# Patient Record
Sex: Female | Born: 1980 | Race: Black or African American | Hispanic: No | Marital: Single | Smoking: Former smoker
Health system: Southern US, Community
[De-identification: ages and names within clinical notes are randomized; demographics above are authoritative.]

## PROBLEM LIST (undated history)

## (undated) ENCOUNTER — Emergency Department (HOSPITAL_COMMUNITY): Admission: EM | Payer: Medicare Other | Source: Home / Self Care

## (undated) DIAGNOSIS — F319 Bipolar disorder, unspecified: Secondary | ICD-10-CM

## (undated) DIAGNOSIS — E669 Obesity, unspecified: Secondary | ICD-10-CM

## (undated) DIAGNOSIS — R569 Unspecified convulsions: Secondary | ICD-10-CM

## (undated) DIAGNOSIS — E119 Type 2 diabetes mellitus without complications: Secondary | ICD-10-CM

## (undated) DIAGNOSIS — F419 Anxiety disorder, unspecified: Secondary | ICD-10-CM

## (undated) DIAGNOSIS — M1990 Unspecified osteoarthritis, unspecified site: Secondary | ICD-10-CM

## (undated) DIAGNOSIS — J984 Other disorders of lung: Secondary | ICD-10-CM

## (undated) DIAGNOSIS — R000 Tachycardia, unspecified: Secondary | ICD-10-CM

## (undated) DIAGNOSIS — G5601 Carpal tunnel syndrome, right upper limb: Secondary | ICD-10-CM

## (undated) DIAGNOSIS — I10 Essential (primary) hypertension: Secondary | ICD-10-CM

## (undated) DIAGNOSIS — I519 Heart disease, unspecified: Secondary | ICD-10-CM

## (undated) DIAGNOSIS — I509 Heart failure, unspecified: Secondary | ICD-10-CM

## (undated) DIAGNOSIS — Q246 Congenital heart block: Secondary | ICD-10-CM

## (undated) DIAGNOSIS — Z950 Presence of cardiac pacemaker: Secondary | ICD-10-CM

## (undated) DIAGNOSIS — J45909 Unspecified asthma, uncomplicated: Secondary | ICD-10-CM

## (undated) DIAGNOSIS — M329 Systemic lupus erythematosus, unspecified: Secondary | ICD-10-CM

## (undated) DIAGNOSIS — IMO0002 Reserved for concepts with insufficient information to code with codable children: Secondary | ICD-10-CM

## (undated) DIAGNOSIS — F329 Major depressive disorder, single episode, unspecified: Secondary | ICD-10-CM

## (undated) DIAGNOSIS — J189 Pneumonia, unspecified organism: Secondary | ICD-10-CM

## (undated) DIAGNOSIS — K219 Gastro-esophageal reflux disease without esophagitis: Secondary | ICD-10-CM

## (undated) DIAGNOSIS — J849 Interstitial pulmonary disease, unspecified: Secondary | ICD-10-CM

## (undated) DIAGNOSIS — I2781 Cor pulmonale (chronic): Secondary | ICD-10-CM

## (undated) DIAGNOSIS — F32A Depression, unspecified: Secondary | ICD-10-CM

## (undated) HISTORY — PX: INSERT / REPLACE / REMOVE PACEMAKER: SUR710

## (undated) HISTORY — PX: CHOLECYSTECTOMY: SHX55

## (undated) HISTORY — DX: Obesity, unspecified: E66.9

## (undated) HISTORY — DX: Congenital heart block: Q24.6

## (undated) HISTORY — PX: NASAL FRACTURE SURGERY: SHX718

## (undated) HISTORY — DX: Gastro-esophageal reflux disease without esophagitis: K21.9

## (undated) HISTORY — DX: Heart disease, unspecified: I51.9

## (undated) HISTORY — DX: Bipolar disorder, unspecified: F31.9

## (undated) HISTORY — DX: Presence of cardiac pacemaker: Z95.0

---
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
## 1999-08-18 ENCOUNTER — Emergency Department (HOSPITAL_COMMUNITY): Admission: EM | Admit: 1999-08-18 | Discharge: 1999-08-19 | Payer: Self-pay | Admitting: *Deleted

## 1999-10-26 ENCOUNTER — Emergency Department (HOSPITAL_COMMUNITY): Admission: EM | Admit: 1999-10-26 | Discharge: 1999-10-26 | Payer: Self-pay | Admitting: Emergency Medicine

## 1999-10-26 ENCOUNTER — Encounter: Payer: Self-pay | Admitting: Emergency Medicine

## 1999-11-13 ENCOUNTER — Emergency Department (HOSPITAL_COMMUNITY): Admission: EM | Admit: 1999-11-13 | Discharge: 1999-11-14 | Payer: Self-pay | Admitting: Emergency Medicine

## 1999-11-16 ENCOUNTER — Emergency Department (HOSPITAL_COMMUNITY): Admission: EM | Admit: 1999-11-16 | Discharge: 1999-11-16 | Payer: Self-pay | Admitting: Internal Medicine

## 1999-11-21 ENCOUNTER — Emergency Department (HOSPITAL_COMMUNITY): Admission: EM | Admit: 1999-11-21 | Discharge: 1999-11-21 | Payer: Self-pay | Admitting: Emergency Medicine

## 1999-11-22 ENCOUNTER — Emergency Department (HOSPITAL_COMMUNITY): Admission: EM | Admit: 1999-11-22 | Discharge: 1999-11-23 | Payer: Self-pay | Admitting: Internal Medicine

## 1999-11-23 ENCOUNTER — Encounter: Payer: Self-pay | Admitting: Internal Medicine

## 1999-11-28 ENCOUNTER — Inpatient Hospital Stay (HOSPITAL_COMMUNITY): Admission: EM | Admit: 1999-11-28 | Discharge: 1999-12-10 | Payer: Self-pay | Admitting: *Deleted

## 1999-11-28 ENCOUNTER — Emergency Department (HOSPITAL_COMMUNITY): Admission: EM | Admit: 1999-11-28 | Discharge: 1999-11-28 | Payer: Self-pay | Admitting: Emergency Medicine

## 1999-11-28 ENCOUNTER — Encounter: Payer: Self-pay | Admitting: Emergency Medicine

## 2000-01-15 ENCOUNTER — Other Ambulatory Visit: Admission: RE | Admit: 2000-01-15 | Discharge: 2000-01-15 | Payer: Self-pay | Admitting: Family Medicine

## 2000-03-21 ENCOUNTER — Emergency Department (HOSPITAL_COMMUNITY): Admission: EM | Admit: 2000-03-21 | Discharge: 2000-03-22 | Payer: Self-pay | Admitting: Emergency Medicine

## 2000-03-21 ENCOUNTER — Encounter: Payer: Self-pay | Admitting: Emergency Medicine

## 2000-09-28 ENCOUNTER — Encounter: Payer: Self-pay | Admitting: Emergency Medicine

## 2000-09-28 ENCOUNTER — Emergency Department (HOSPITAL_COMMUNITY): Admission: EM | Admit: 2000-09-28 | Discharge: 2000-09-28 | Payer: Self-pay | Admitting: Emergency Medicine

## 2000-12-01 ENCOUNTER — Emergency Department (HOSPITAL_COMMUNITY): Admission: EM | Admit: 2000-12-01 | Discharge: 2000-12-02 | Payer: Self-pay | Admitting: Emergency Medicine

## 2000-12-02 ENCOUNTER — Encounter: Payer: Self-pay | Admitting: Emergency Medicine

## 2001-01-14 ENCOUNTER — Encounter: Admission: RE | Admit: 2001-01-14 | Discharge: 2001-01-14 | Payer: Self-pay | Admitting: Family Medicine

## 2001-01-14 ENCOUNTER — Other Ambulatory Visit: Admission: RE | Admit: 2001-01-14 | Discharge: 2001-01-14 | Payer: Self-pay | Admitting: *Deleted

## 2001-02-12 ENCOUNTER — Inpatient Hospital Stay (HOSPITAL_COMMUNITY): Admission: EM | Admit: 2001-02-12 | Discharge: 2001-02-16 | Payer: Self-pay | Admitting: *Deleted

## 2001-08-02 ENCOUNTER — Encounter: Admission: RE | Admit: 2001-08-02 | Discharge: 2001-08-02 | Payer: Self-pay | Admitting: Family Medicine

## 2001-08-26 ENCOUNTER — Emergency Department (HOSPITAL_COMMUNITY): Admission: EM | Admit: 2001-08-26 | Discharge: 2001-08-26 | Payer: Self-pay

## 2001-10-17 ENCOUNTER — Emergency Department (HOSPITAL_COMMUNITY): Admission: EM | Admit: 2001-10-17 | Discharge: 2001-10-18 | Payer: Self-pay | Admitting: Emergency Medicine

## 2001-10-18 ENCOUNTER — Encounter: Payer: Self-pay | Admitting: Emergency Medicine

## 2001-11-02 ENCOUNTER — Emergency Department (HOSPITAL_COMMUNITY): Admission: EM | Admit: 2001-11-02 | Discharge: 2001-11-02 | Payer: Self-pay

## 2002-02-16 ENCOUNTER — Emergency Department (HOSPITAL_COMMUNITY): Admission: EM | Admit: 2002-02-16 | Discharge: 2002-02-16 | Payer: Self-pay | Admitting: Emergency Medicine

## 2003-01-03 ENCOUNTER — Encounter: Admission: RE | Admit: 2003-01-03 | Discharge: 2003-01-03 | Payer: Self-pay | Admitting: Family Medicine

## 2003-01-30 ENCOUNTER — Emergency Department (HOSPITAL_COMMUNITY): Admission: EM | Admit: 2003-01-30 | Discharge: 2003-01-30 | Payer: Self-pay | Admitting: Emergency Medicine

## 2003-02-07 ENCOUNTER — Encounter: Admission: RE | Admit: 2003-02-07 | Discharge: 2003-02-07 | Payer: Self-pay | Admitting: Family Medicine

## 2003-02-10 ENCOUNTER — Encounter: Admission: RE | Admit: 2003-02-10 | Discharge: 2003-02-10 | Payer: Self-pay | Admitting: Family Medicine

## 2003-03-28 ENCOUNTER — Encounter: Admission: RE | Admit: 2003-03-28 | Discharge: 2003-03-28 | Payer: Self-pay | Admitting: Family Medicine

## 2003-04-02 ENCOUNTER — Emergency Department (HOSPITAL_COMMUNITY): Admission: EM | Admit: 2003-04-02 | Discharge: 2003-04-03 | Payer: Self-pay | Admitting: Emergency Medicine

## 2003-05-05 ENCOUNTER — Encounter: Payer: Self-pay | Admitting: Emergency Medicine

## 2003-05-05 ENCOUNTER — Emergency Department (HOSPITAL_COMMUNITY): Admission: EM | Admit: 2003-05-05 | Discharge: 2003-05-05 | Payer: Self-pay | Admitting: *Deleted

## 2003-05-26 ENCOUNTER — Encounter: Admission: RE | Admit: 2003-05-26 | Discharge: 2003-05-26 | Payer: Self-pay | Admitting: Family Medicine

## 2003-06-21 ENCOUNTER — Encounter: Payer: Self-pay | Admitting: Cardiology

## 2003-06-21 ENCOUNTER — Ambulatory Visit (HOSPITAL_COMMUNITY): Admission: RE | Admit: 2003-06-21 | Discharge: 2003-06-21 | Payer: Self-pay | Admitting: Cardiology

## 2003-08-24 ENCOUNTER — Emergency Department (HOSPITAL_COMMUNITY): Admission: EM | Admit: 2003-08-24 | Discharge: 2003-08-24 | Payer: Self-pay | Admitting: Emergency Medicine

## 2003-08-24 ENCOUNTER — Encounter: Admission: RE | Admit: 2003-08-24 | Discharge: 2003-08-24 | Payer: Self-pay | Admitting: Sports Medicine

## 2003-09-05 ENCOUNTER — Emergency Department (HOSPITAL_COMMUNITY): Admission: EM | Admit: 2003-09-05 | Discharge: 2003-09-05 | Payer: Self-pay | Admitting: Emergency Medicine

## 2003-09-08 ENCOUNTER — Encounter: Admission: RE | Admit: 2003-09-08 | Discharge: 2003-09-08 | Payer: Self-pay | Admitting: Sports Medicine

## 2003-11-02 ENCOUNTER — Encounter: Admission: RE | Admit: 2003-11-02 | Discharge: 2003-11-02 | Payer: Self-pay | Admitting: Sports Medicine

## 2003-11-13 ENCOUNTER — Emergency Department (HOSPITAL_COMMUNITY): Admission: AD | Admit: 2003-11-13 | Discharge: 2003-11-13 | Payer: Self-pay | Admitting: Family Medicine

## 2003-11-21 ENCOUNTER — Encounter: Admission: RE | Admit: 2003-11-21 | Discharge: 2003-11-21 | Payer: Self-pay | Admitting: Family Medicine

## 2003-12-28 ENCOUNTER — Encounter: Admission: RE | Admit: 2003-12-28 | Discharge: 2003-12-28 | Payer: Self-pay | Admitting: Family Medicine

## 2003-12-29 ENCOUNTER — Encounter: Admission: RE | Admit: 2003-12-29 | Discharge: 2003-12-29 | Payer: Self-pay | Admitting: Family Medicine

## 2004-02-08 ENCOUNTER — Encounter: Admission: RE | Admit: 2004-02-08 | Discharge: 2004-02-08 | Payer: Self-pay | Admitting: Family Medicine

## 2004-02-15 ENCOUNTER — Encounter: Admission: RE | Admit: 2004-02-15 | Discharge: 2004-02-15 | Payer: Self-pay | Admitting: Sports Medicine

## 2004-02-21 ENCOUNTER — Ambulatory Visit (HOSPITAL_COMMUNITY): Admission: RE | Admit: 2004-02-21 | Discharge: 2004-02-21 | Payer: Self-pay | Admitting: Family Medicine

## 2004-03-01 ENCOUNTER — Encounter: Admission: RE | Admit: 2004-03-01 | Discharge: 2004-03-01 | Payer: Self-pay | Admitting: Family Medicine

## 2004-03-22 ENCOUNTER — Encounter (INDEPENDENT_AMBULATORY_CARE_PROVIDER_SITE_OTHER): Payer: Self-pay | Admitting: Specialist

## 2004-03-22 ENCOUNTER — Observation Stay (HOSPITAL_COMMUNITY): Admission: RE | Admit: 2004-03-22 | Discharge: 2004-03-23 | Payer: Self-pay

## 2004-04-23 ENCOUNTER — Encounter: Admission: RE | Admit: 2004-04-23 | Discharge: 2004-04-23 | Payer: Self-pay | Admitting: Sports Medicine

## 2004-05-08 ENCOUNTER — Encounter: Admission: RE | Admit: 2004-05-08 | Discharge: 2004-05-08 | Payer: Self-pay | Admitting: Family Medicine

## 2004-05-24 ENCOUNTER — Encounter: Admission: RE | Admit: 2004-05-24 | Discharge: 2004-05-24 | Payer: Self-pay | Admitting: Family Medicine

## 2004-05-31 ENCOUNTER — Emergency Department (HOSPITAL_COMMUNITY): Admission: EM | Admit: 2004-05-31 | Discharge: 2004-05-31 | Payer: Self-pay | Admitting: Emergency Medicine

## 2004-06-05 ENCOUNTER — Emergency Department (HOSPITAL_COMMUNITY): Admission: EM | Admit: 2004-06-05 | Discharge: 2004-06-05 | Payer: Self-pay | Admitting: Family Medicine

## 2004-07-09 ENCOUNTER — Emergency Department (HOSPITAL_COMMUNITY): Admission: EM | Admit: 2004-07-09 | Discharge: 2004-07-09 | Payer: Self-pay | Admitting: *Deleted

## 2004-07-15 ENCOUNTER — Ambulatory Visit: Payer: Self-pay | Admitting: Family Medicine

## 2004-08-01 ENCOUNTER — Ambulatory Visit: Payer: Self-pay | Admitting: Family Medicine

## 2004-08-28 ENCOUNTER — Emergency Department (HOSPITAL_COMMUNITY): Admission: EM | Admit: 2004-08-28 | Discharge: 2004-08-28 | Payer: Self-pay | Admitting: Emergency Medicine

## 2004-08-30 ENCOUNTER — Ambulatory Visit: Payer: Self-pay | Admitting: Family Medicine

## 2004-11-06 ENCOUNTER — Ambulatory Visit: Payer: Self-pay | Admitting: Family Medicine

## 2004-11-16 ENCOUNTER — Emergency Department (HOSPITAL_COMMUNITY): Admission: EM | Admit: 2004-11-16 | Discharge: 2004-11-17 | Payer: Self-pay | Admitting: Emergency Medicine

## 2005-01-08 ENCOUNTER — Emergency Department (HOSPITAL_COMMUNITY): Admission: EM | Admit: 2005-01-08 | Discharge: 2005-01-08 | Payer: Self-pay | Admitting: Emergency Medicine

## 2005-01-10 ENCOUNTER — Ambulatory Visit: Payer: Self-pay | Admitting: Family Medicine

## 2005-03-04 ENCOUNTER — Ambulatory Visit: Payer: Self-pay | Admitting: Family Medicine

## 2005-05-23 ENCOUNTER — Ambulatory Visit: Payer: Self-pay | Admitting: Family Medicine

## 2005-05-30 ENCOUNTER — Ambulatory Visit: Payer: Self-pay | Admitting: Family Medicine

## 2005-07-22 ENCOUNTER — Ambulatory Visit: Payer: Self-pay | Admitting: Sports Medicine

## 2005-07-22 ENCOUNTER — Ambulatory Visit (HOSPITAL_COMMUNITY): Admission: RE | Admit: 2005-07-22 | Discharge: 2005-07-22 | Payer: Self-pay | Admitting: Sports Medicine

## 2005-07-23 ENCOUNTER — Emergency Department (HOSPITAL_COMMUNITY): Admission: EM | Admit: 2005-07-23 | Discharge: 2005-07-23 | Payer: Self-pay | Admitting: Family Medicine

## 2005-07-23 ENCOUNTER — Ambulatory Visit: Payer: Self-pay | Admitting: *Deleted

## 2005-07-23 ENCOUNTER — Ambulatory Visit (HOSPITAL_COMMUNITY): Admission: RE | Admit: 2005-07-23 | Discharge: 2005-07-23 | Payer: Self-pay | Admitting: Family Medicine

## 2005-07-29 ENCOUNTER — Ambulatory Visit: Payer: Self-pay | Admitting: Sports Medicine

## 2005-08-29 ENCOUNTER — Ambulatory Visit: Payer: Self-pay | Admitting: Sports Medicine

## 2005-08-29 ENCOUNTER — Other Ambulatory Visit: Admission: RE | Admit: 2005-08-29 | Discharge: 2005-08-29 | Payer: Self-pay | Admitting: Family Medicine

## 2005-09-24 ENCOUNTER — Encounter: Admission: RE | Admit: 2005-09-24 | Discharge: 2005-09-24 | Payer: Self-pay | Admitting: Otolaryngology

## 2005-10-15 ENCOUNTER — Ambulatory Visit (HOSPITAL_COMMUNITY): Admission: RE | Admit: 2005-10-15 | Discharge: 2005-10-15 | Payer: Self-pay | Admitting: Otolaryngology

## 2005-10-15 ENCOUNTER — Encounter (INDEPENDENT_AMBULATORY_CARE_PROVIDER_SITE_OTHER): Payer: Self-pay | Admitting: *Deleted

## 2005-11-28 ENCOUNTER — Ambulatory Visit: Payer: Self-pay | Admitting: Family Medicine

## 2005-12-19 ENCOUNTER — Encounter: Admission: RE | Admit: 2005-12-19 | Discharge: 2005-12-19 | Payer: Self-pay

## 2005-12-25 ENCOUNTER — Ambulatory Visit: Payer: Self-pay | Admitting: Family Medicine

## 2006-01-21 ENCOUNTER — Ambulatory Visit: Payer: Self-pay | Admitting: Family Medicine

## 2006-02-10 ENCOUNTER — Ambulatory Visit: Payer: Self-pay | Admitting: Family Medicine

## 2006-02-19 ENCOUNTER — Ambulatory Visit: Payer: Self-pay | Admitting: Family Medicine

## 2006-03-30 ENCOUNTER — Ambulatory Visit: Payer: Self-pay | Admitting: Sports Medicine

## 2006-04-02 ENCOUNTER — Ambulatory Visit (HOSPITAL_COMMUNITY): Admission: RE | Admit: 2006-04-02 | Discharge: 2006-04-02 | Payer: Self-pay | Admitting: Family Medicine

## 2006-04-10 ENCOUNTER — Ambulatory Visit: Payer: Self-pay | Admitting: Family Medicine

## 2006-04-30 ENCOUNTER — Ambulatory Visit: Payer: Self-pay | Admitting: Family Medicine

## 2006-07-08 ENCOUNTER — Ambulatory Visit: Payer: Self-pay | Admitting: Sports Medicine

## 2006-07-14 ENCOUNTER — Ambulatory Visit: Payer: Self-pay | Admitting: Family Medicine

## 2006-07-23 ENCOUNTER — Ambulatory Visit: Payer: Self-pay | Admitting: Family Medicine

## 2006-08-02 IMAGING — CR DG ANKLE COMPLETE 3+V*R*
3 series · 3 of 3 positions shown · non-contrast
Comparison: none

CLINICAL DATA: ankle pain and swelling
COMPLETE RIGHT ANKLE 07/09/04

[view not recorded (1 of 3)]
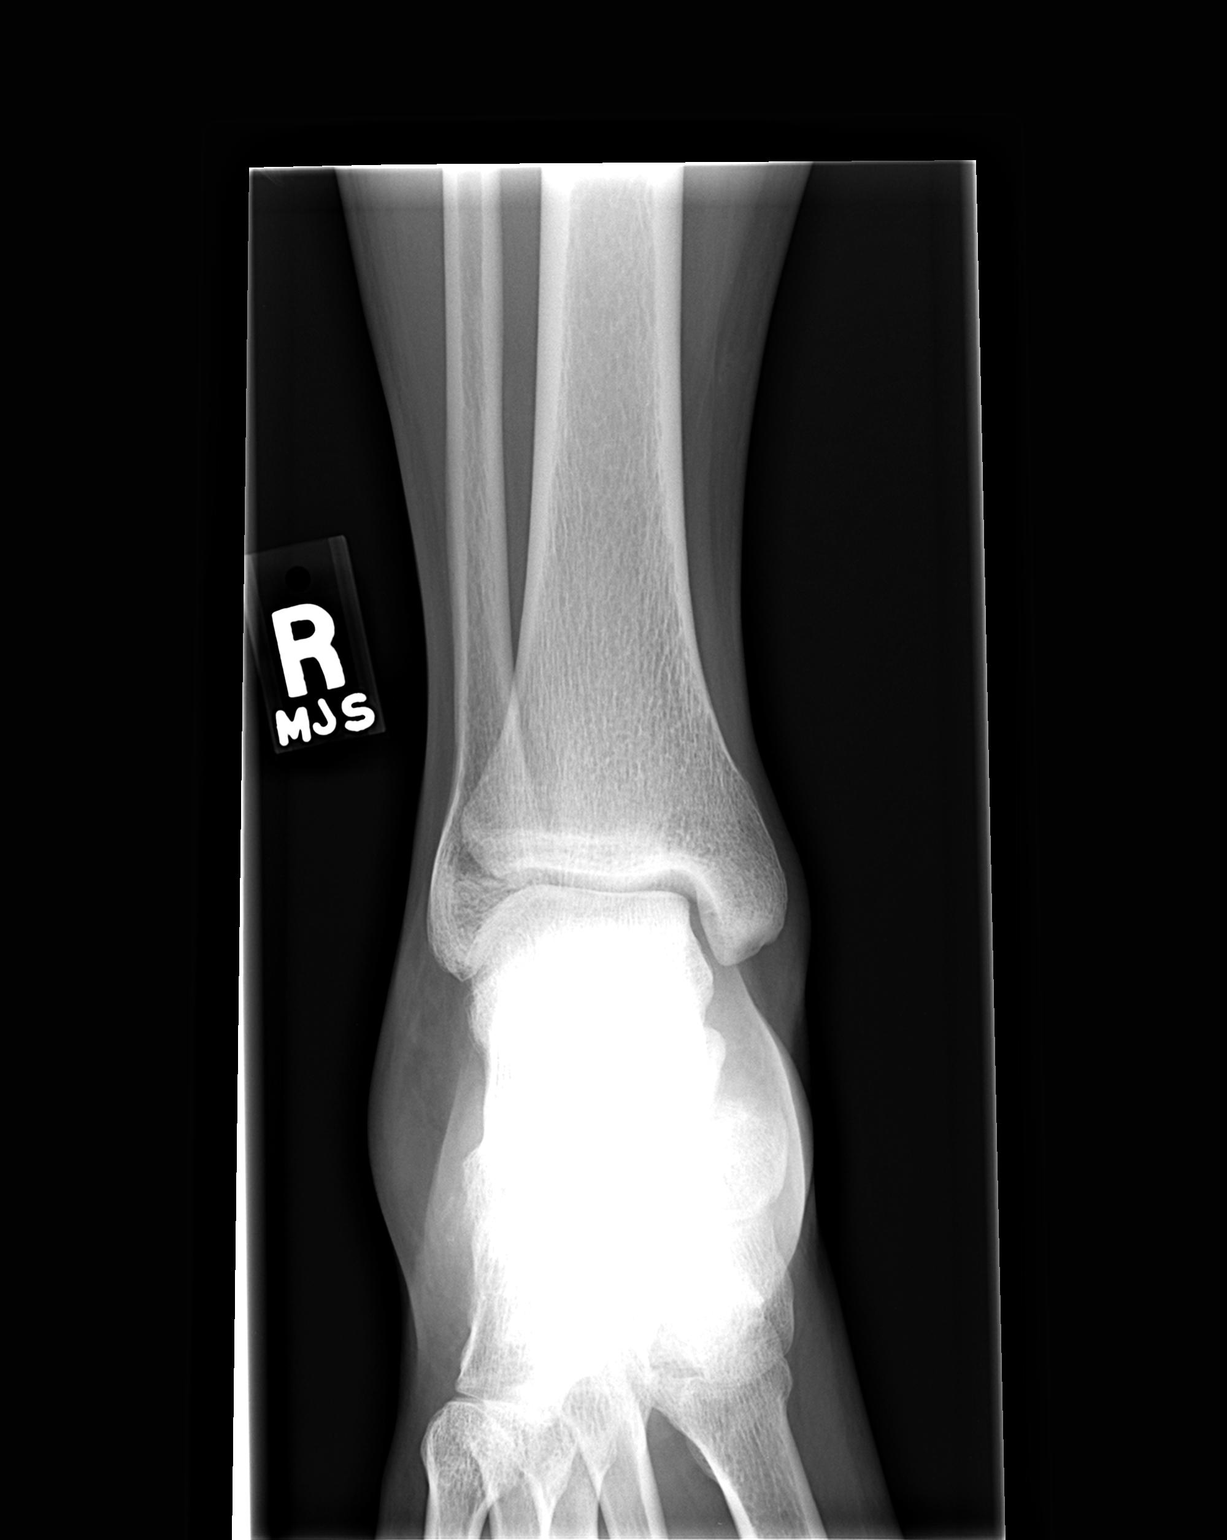

[view not recorded (2 of 3)]
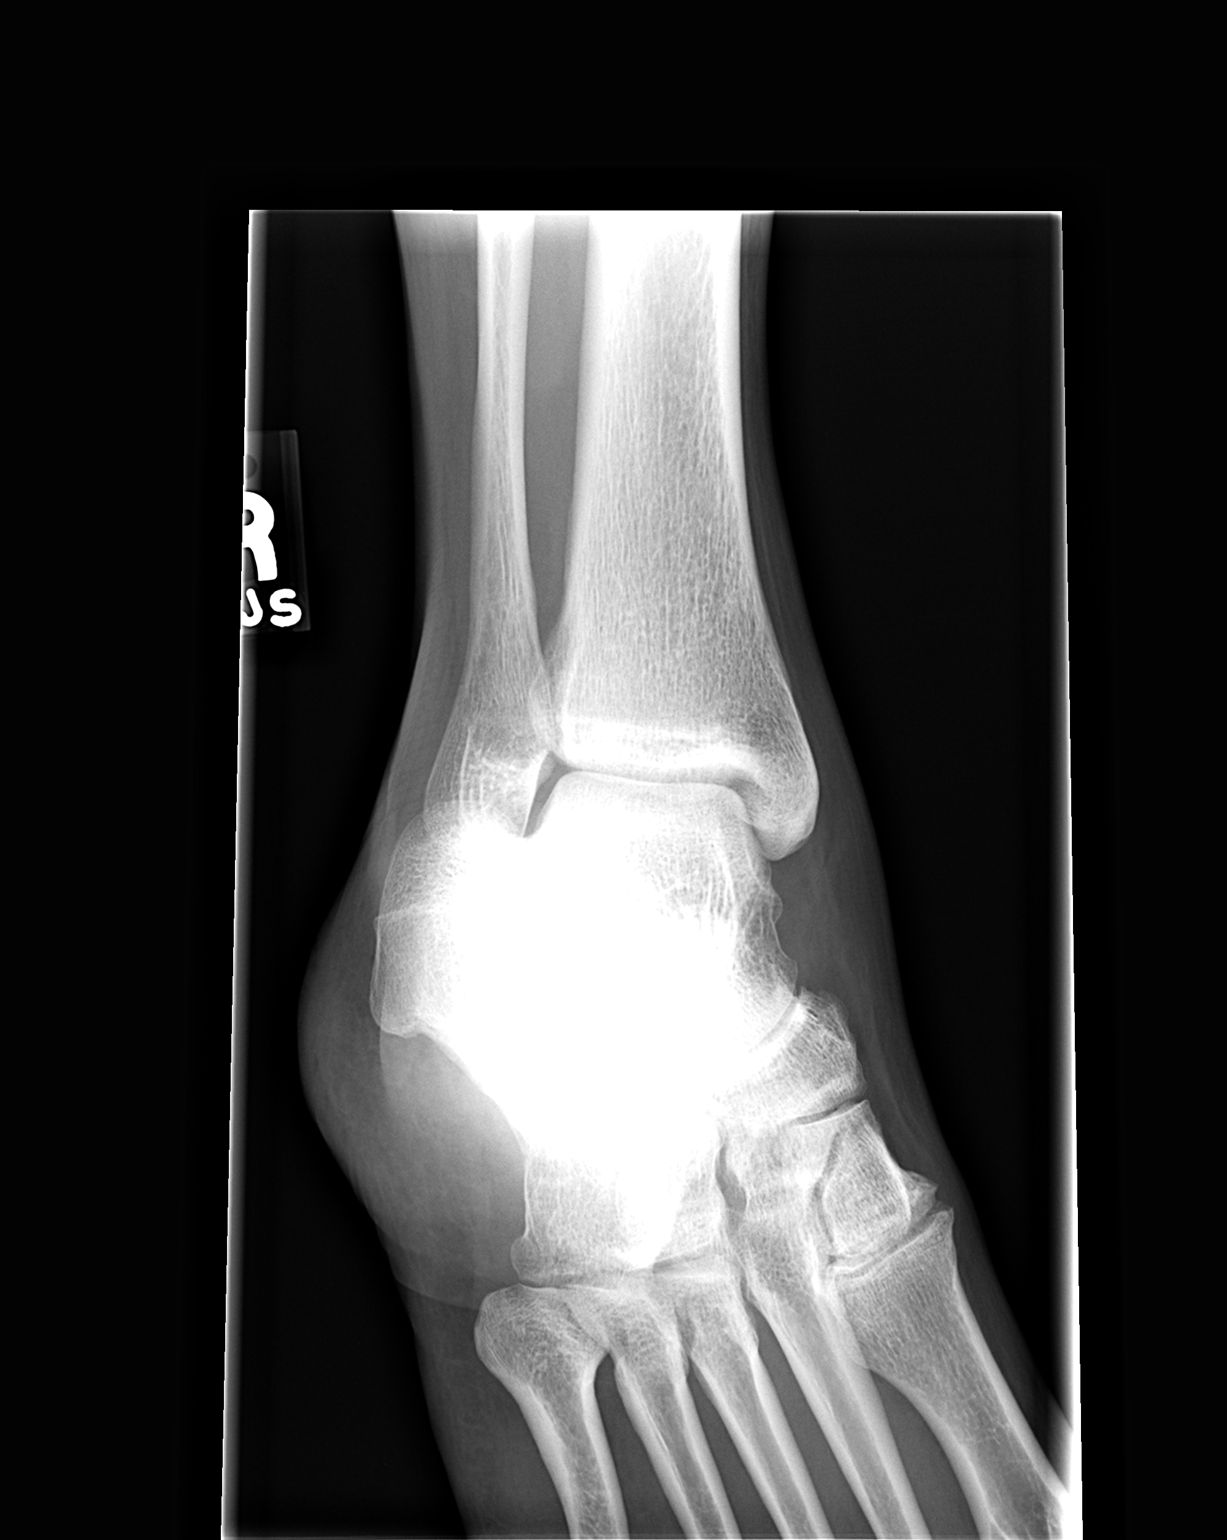

[view not recorded (3 of 3)]
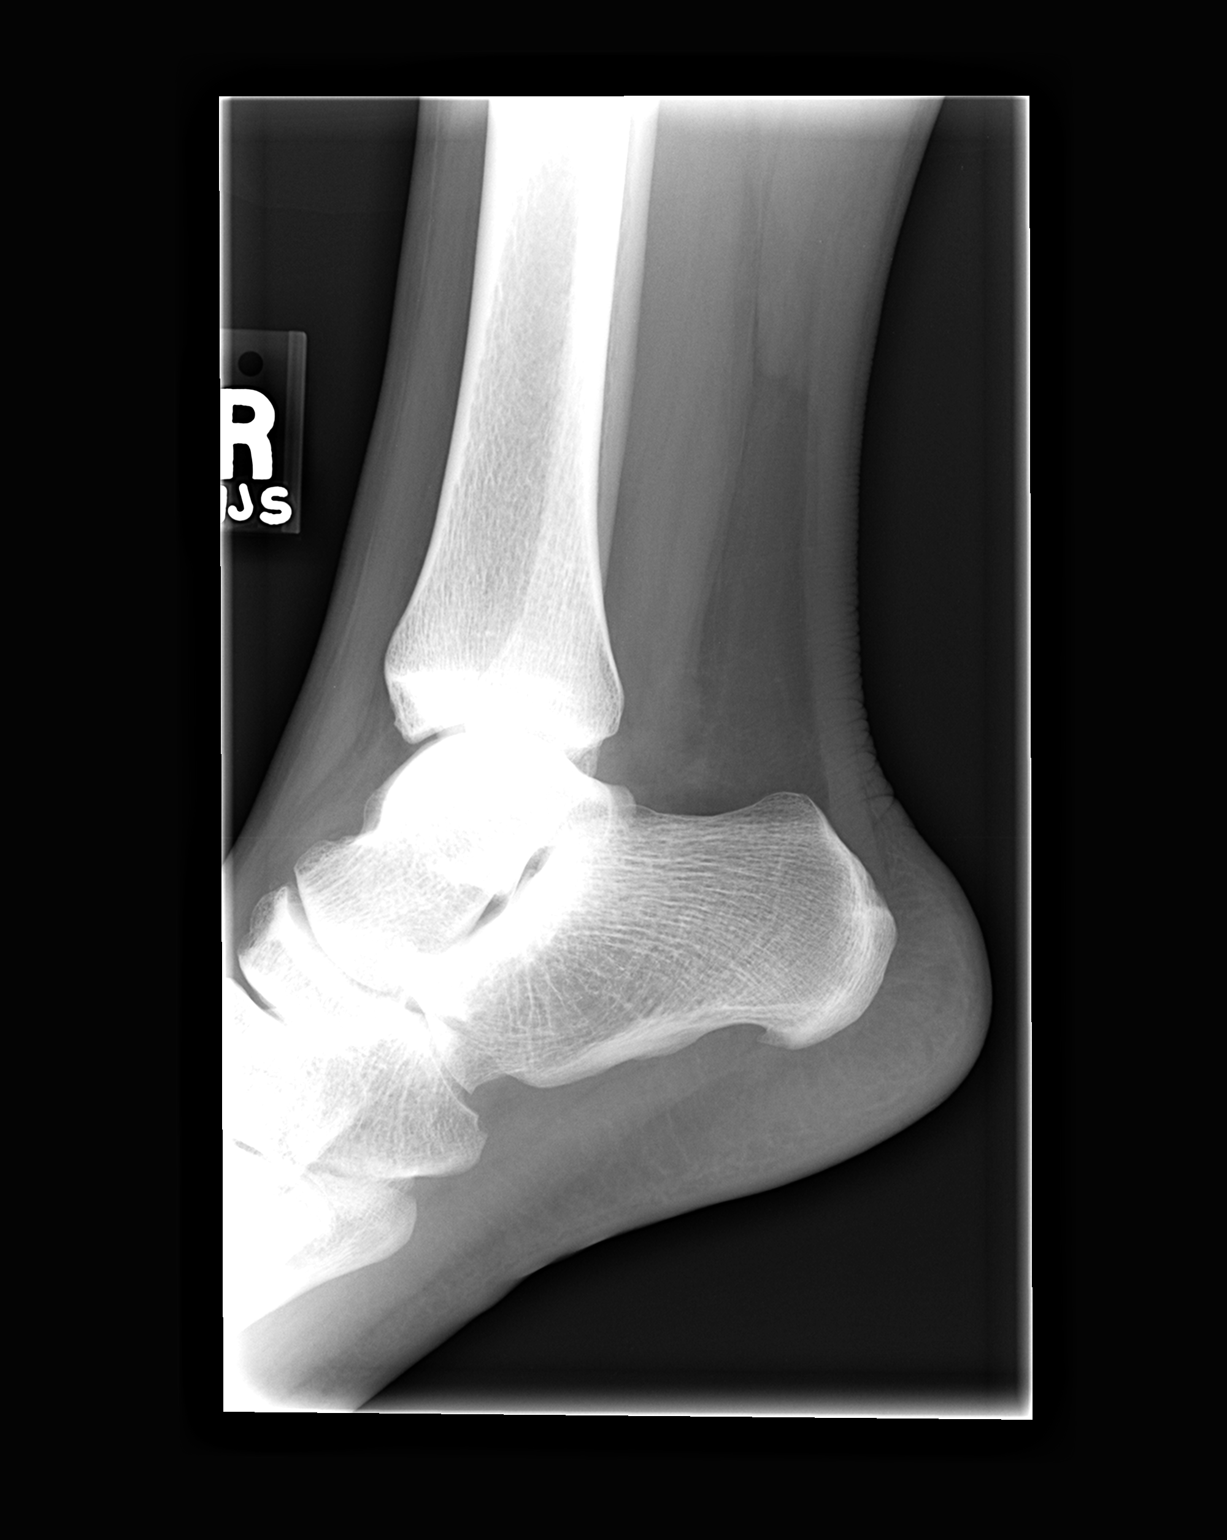

[3 of 3 positions shown; findings below may reference images not displayed]

FINDINGS: Soft tissue swelling overlying the lateral ankle/foot noted.  There is suggestion of small bony densities along the distal lateral calcaneus/cuboid and question fracture.   No other significant abnormality is identified.
IMPRESSION
Question fracture off the lateral distal calcaneus/cuboid.  Consider further evaluation with dedicated foot films.

## 2006-08-02 IMAGING — CR DG FOOT COMPLETE 3+V*R*
3 series · 3 of 3 positions shown · non-contrast
Comparison: none

CLINICAL DATA: Ankle pain and swelling laterally.  
COMPLETE RIGHT FOOT, 07/09/04

[view not recorded (1 of 3)]
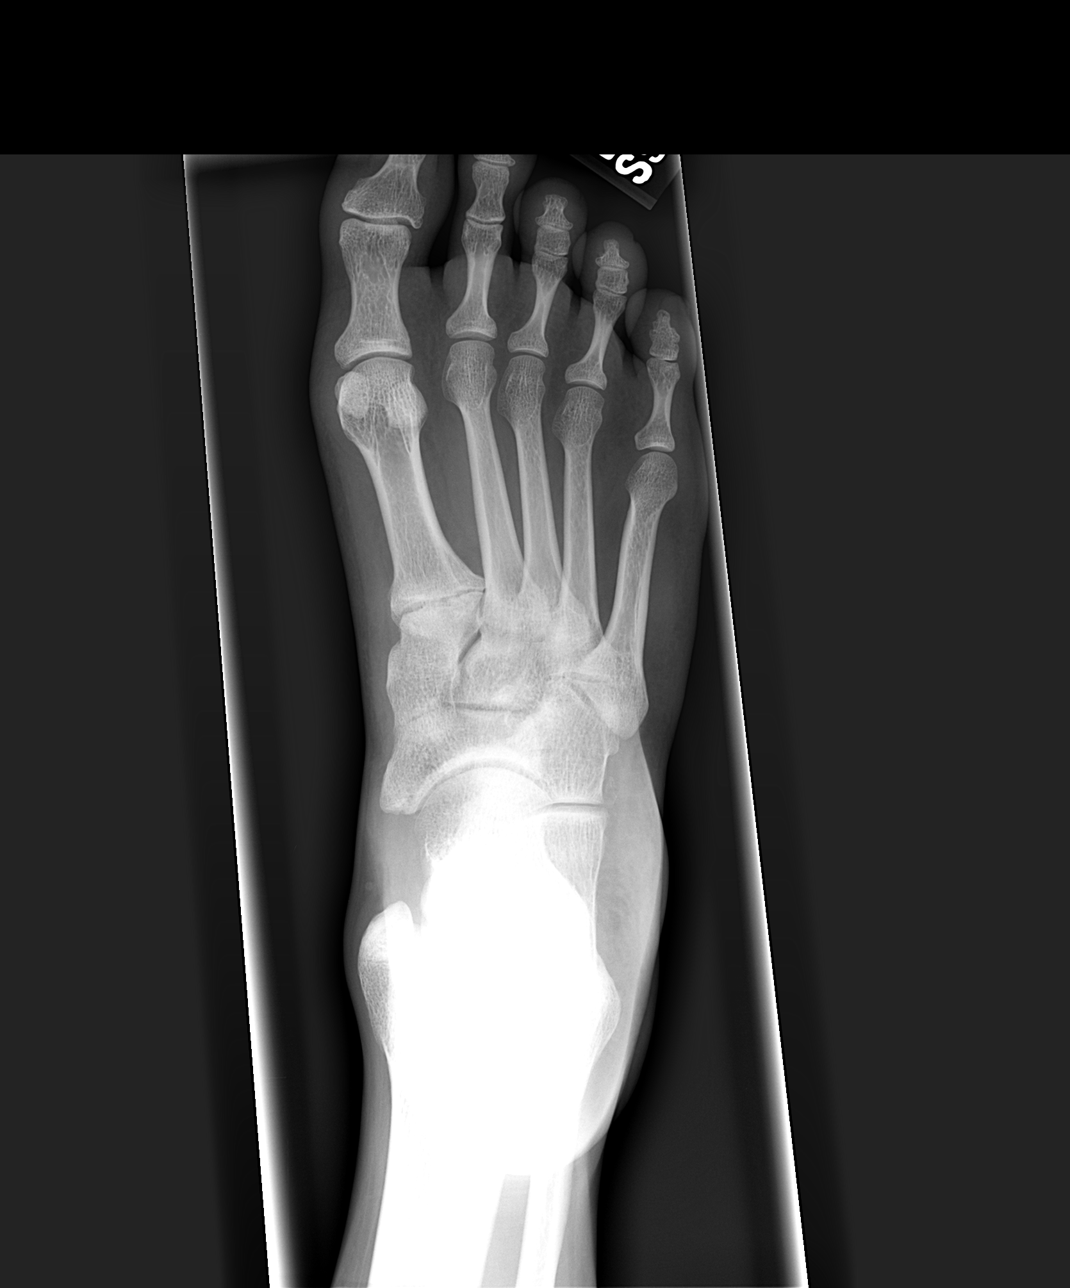

[view not recorded (2 of 3)]
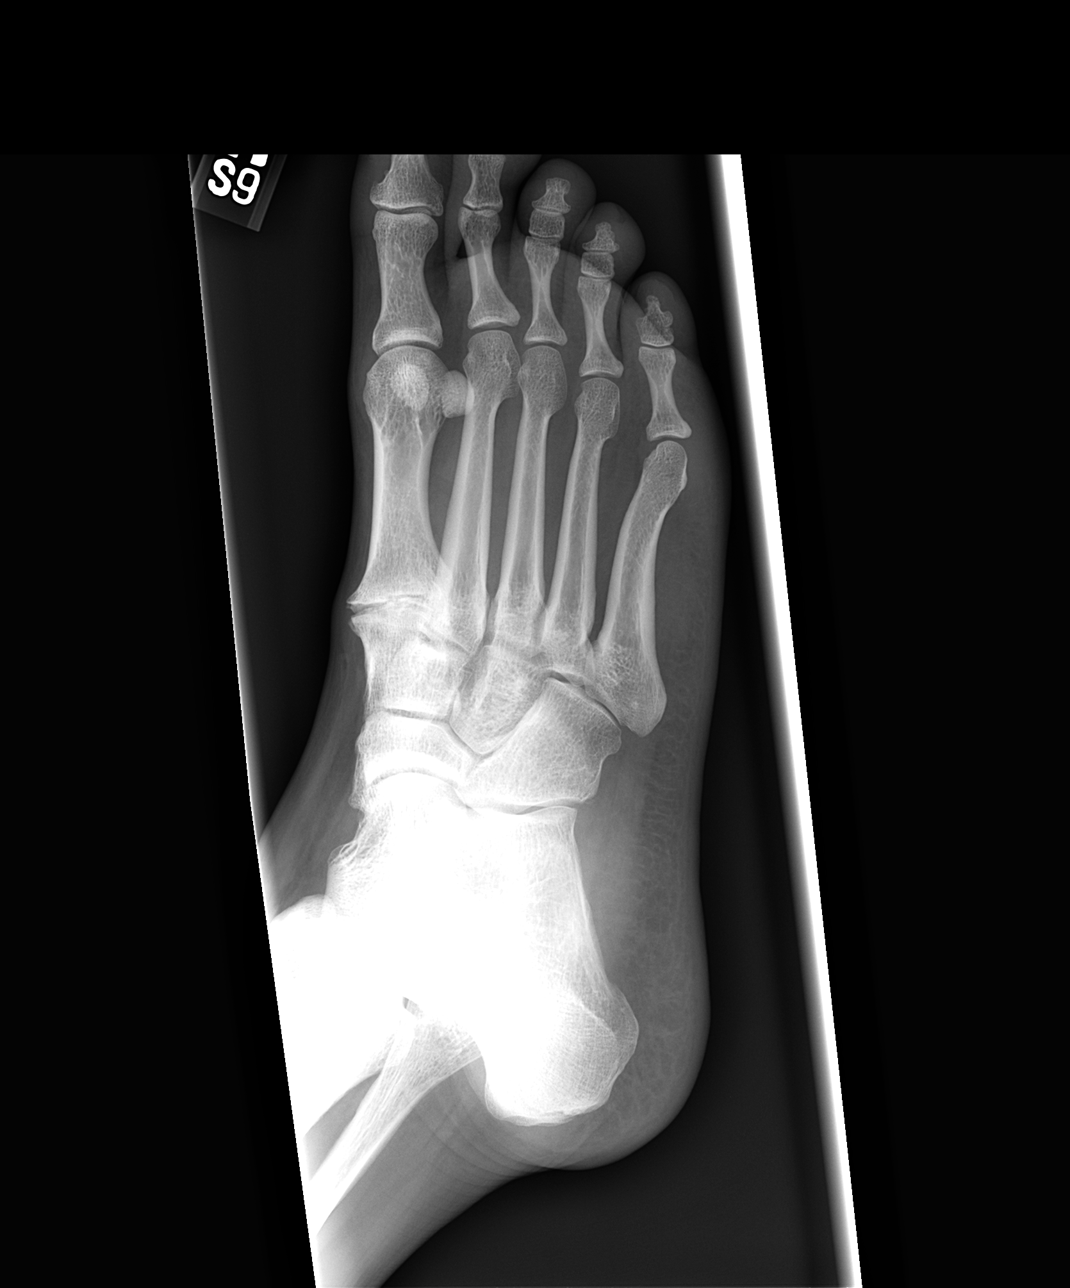

[view not recorded (3 of 3)]
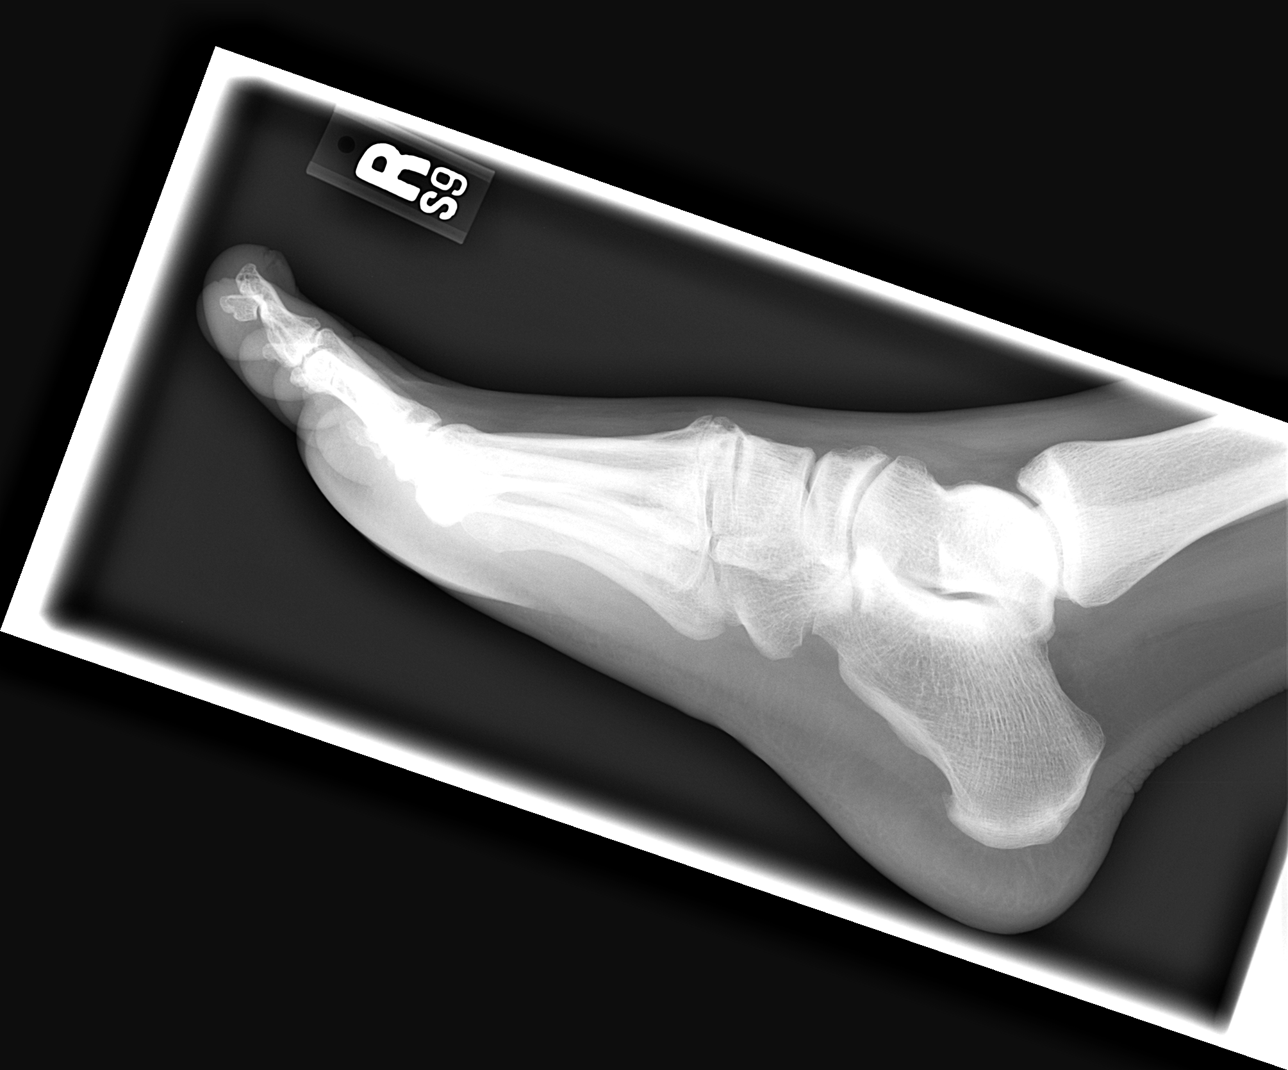

[3 of 3 positions shown; findings below may reference images not displayed]

FINDINGS: Soft tissue swelling overlying the lateral right foot noted.  Small bony densities along the cuboid bone may represent developing accessory ossicle but given soft tissue swelling and pain in area, a fracture is suspected.  Remainder of the right foot is unremarkable.
IMPRESSION
Small bony density along the lateral cuboid bone with associated soft tissue swelling, suspicious for fracture.

## 2006-09-18 ENCOUNTER — Ambulatory Visit: Payer: Self-pay | Admitting: Family Medicine

## 2006-09-23 ENCOUNTER — Ambulatory Visit: Payer: Self-pay | Admitting: Family Medicine

## 2006-10-30 ENCOUNTER — Ambulatory Visit: Payer: Self-pay | Admitting: Family Medicine

## 2006-11-09 ENCOUNTER — Ambulatory Visit: Payer: Self-pay | Admitting: Sports Medicine

## 2006-11-20 ENCOUNTER — Encounter (INDEPENDENT_AMBULATORY_CARE_PROVIDER_SITE_OTHER): Payer: Self-pay | Admitting: *Deleted

## 2006-11-20 LAB — CONVERTED CEMR LAB

## 2006-11-25 ENCOUNTER — Emergency Department (HOSPITAL_COMMUNITY): Admission: EM | Admit: 2006-11-25 | Discharge: 2006-11-26 | Payer: Self-pay | Admitting: Emergency Medicine

## 2006-11-27 ENCOUNTER — Encounter (INDEPENDENT_AMBULATORY_CARE_PROVIDER_SITE_OTHER): Payer: Self-pay | Admitting: Family Medicine

## 2006-11-27 ENCOUNTER — Ambulatory Visit: Payer: Self-pay | Admitting: Family Medicine

## 2006-11-27 LAB — CONVERTED CEMR LAB: GC Probe Amp, Genital: NEGATIVE

## 2006-12-11 ENCOUNTER — Emergency Department (HOSPITAL_COMMUNITY): Admission: EM | Admit: 2006-12-11 | Discharge: 2006-12-11 | Payer: Self-pay | Admitting: Family Medicine

## 2006-12-17 DIAGNOSIS — K589 Irritable bowel syndrome without diarrhea: Secondary | ICD-10-CM

## 2006-12-17 DIAGNOSIS — E282 Polycystic ovarian syndrome: Secondary | ICD-10-CM | POA: Insufficient documentation

## 2006-12-17 DIAGNOSIS — I459 Conduction disorder, unspecified: Secondary | ICD-10-CM | POA: Insufficient documentation

## 2006-12-17 DIAGNOSIS — F319 Bipolar disorder, unspecified: Secondary | ICD-10-CM | POA: Insufficient documentation

## 2006-12-17 DIAGNOSIS — E669 Obesity, unspecified: Secondary | ICD-10-CM | POA: Insufficient documentation

## 2006-12-18 ENCOUNTER — Encounter (INDEPENDENT_AMBULATORY_CARE_PROVIDER_SITE_OTHER): Payer: Self-pay | Admitting: *Deleted

## 2007-01-08 ENCOUNTER — Telehealth (INDEPENDENT_AMBULATORY_CARE_PROVIDER_SITE_OTHER): Payer: Self-pay | Admitting: Family Medicine

## 2007-01-27 ENCOUNTER — Ambulatory Visit: Payer: Self-pay | Admitting: Family Medicine

## 2007-01-28 ENCOUNTER — Encounter (INDEPENDENT_AMBULATORY_CARE_PROVIDER_SITE_OTHER): Payer: Self-pay | Admitting: Family Medicine

## 2007-02-01 ENCOUNTER — Telehealth (INDEPENDENT_AMBULATORY_CARE_PROVIDER_SITE_OTHER): Payer: Self-pay | Admitting: *Deleted

## 2007-02-01 IMAGING — CR DG CHEST 1V PORT
1 series · 1 of 1 positions shown · non-contrast
Comparison: none

CLINICAL DATA: weakness, shortness of breath 
 PORTABLE [id number] VIEW:

[view not recorded]
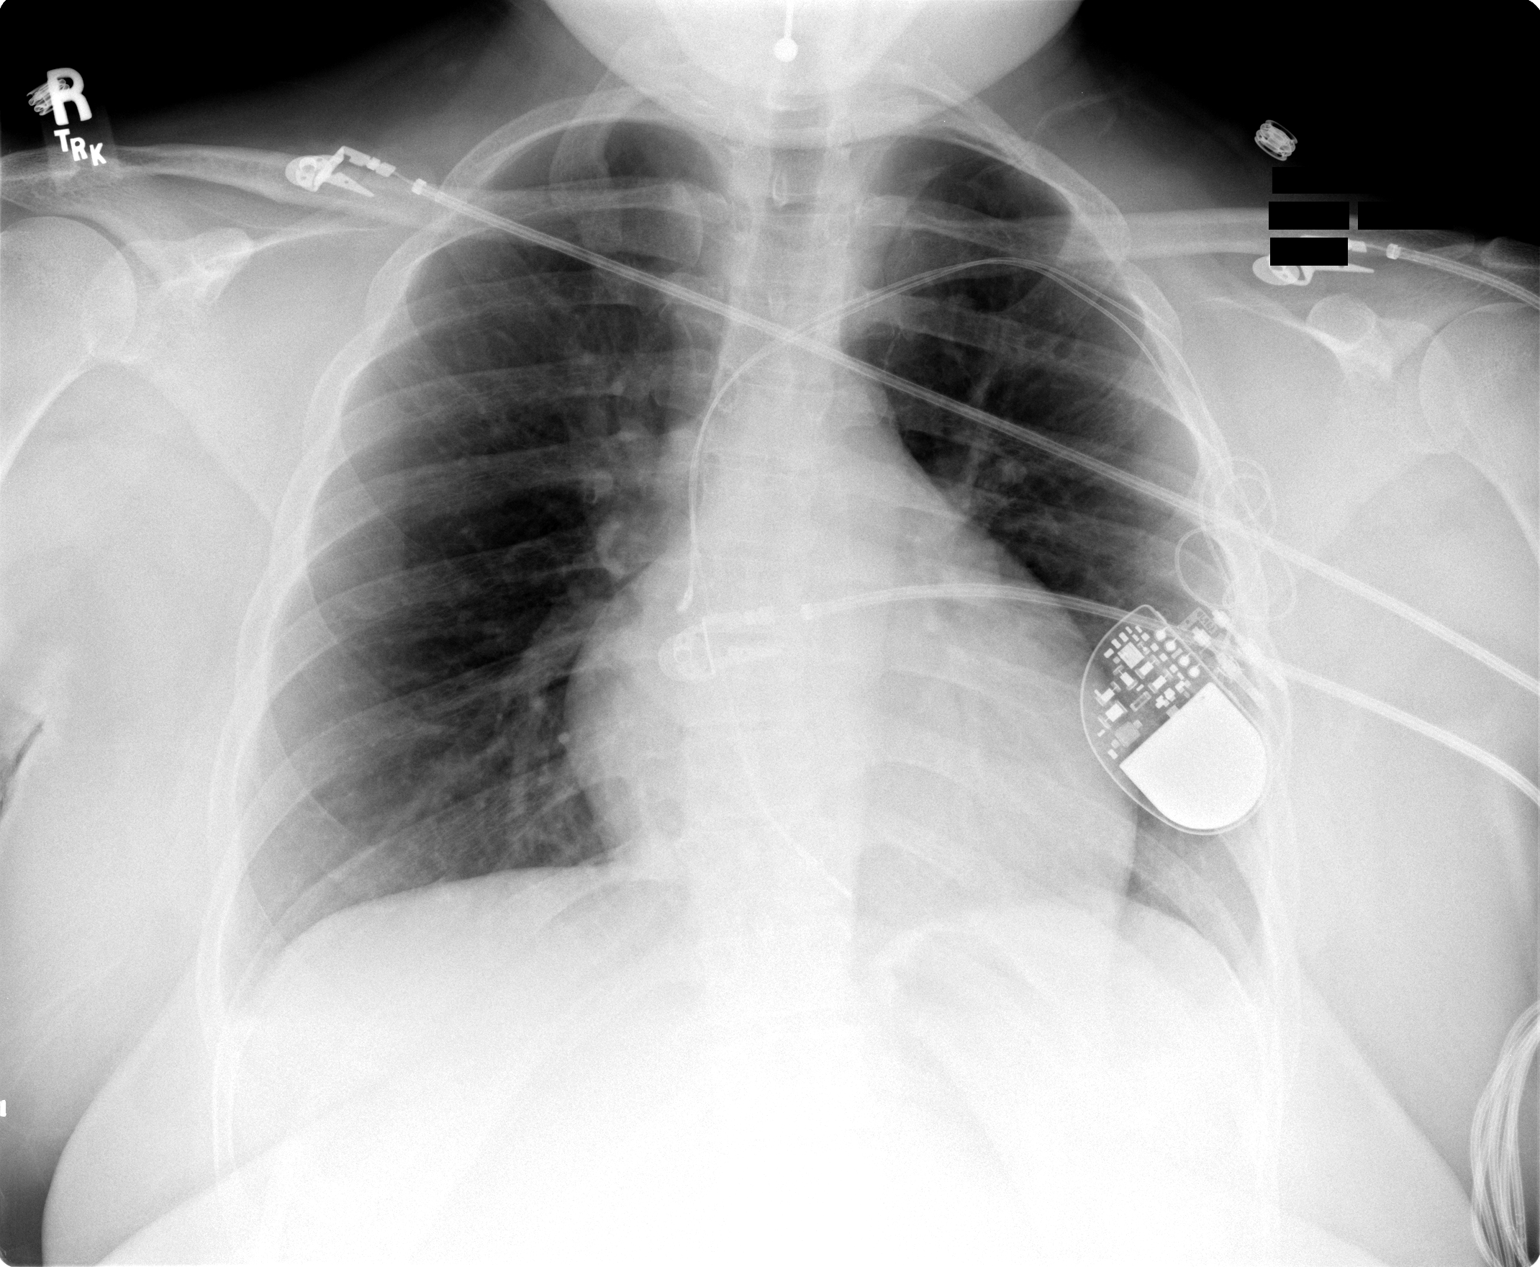

[1 of 1 positions shown; findings below may reference images not displayed]

FINDINGS: Heart and vascularity normal in 1 view.  Lungs clear.  Dual lead permanent cardiac pacer in place from the left subclavian vein.
IMPRESSION: Permanent cardiac pacer- no active disease.

## 2007-02-09 ENCOUNTER — Telehealth (INDEPENDENT_AMBULATORY_CARE_PROVIDER_SITE_OTHER): Payer: Self-pay | Admitting: Family Medicine

## 2007-02-22 ENCOUNTER — Telehealth (INDEPENDENT_AMBULATORY_CARE_PROVIDER_SITE_OTHER): Payer: Self-pay | Admitting: Family Medicine

## 2007-02-22 ENCOUNTER — Encounter (INDEPENDENT_AMBULATORY_CARE_PROVIDER_SITE_OTHER): Payer: Self-pay | Admitting: Family Medicine

## 2007-04-13 ENCOUNTER — Telehealth: Payer: Self-pay | Admitting: *Deleted

## 2007-06-14 ENCOUNTER — Ambulatory Visit (HOSPITAL_COMMUNITY): Admission: RE | Admit: 2007-06-14 | Discharge: 2007-06-14 | Payer: Self-pay | Admitting: *Deleted

## 2007-06-25 ENCOUNTER — Ambulatory Visit: Payer: Self-pay | Admitting: Family Medicine

## 2007-06-25 ENCOUNTER — Telehealth (INDEPENDENT_AMBULATORY_CARE_PROVIDER_SITE_OTHER): Payer: Self-pay | Admitting: *Deleted

## 2007-06-27 ENCOUNTER — Encounter (INDEPENDENT_AMBULATORY_CARE_PROVIDER_SITE_OTHER): Payer: Self-pay | Admitting: Family Medicine

## 2007-08-02 ENCOUNTER — Emergency Department (HOSPITAL_COMMUNITY): Admission: EM | Admit: 2007-08-02 | Discharge: 2007-08-02 | Payer: Self-pay | Admitting: Emergency Medicine

## 2007-09-28 ENCOUNTER — Ambulatory Visit: Payer: Self-pay | Admitting: Family Medicine

## 2007-10-05 ENCOUNTER — Encounter (INDEPENDENT_AMBULATORY_CARE_PROVIDER_SITE_OTHER): Payer: Self-pay | Admitting: Family Medicine

## 2007-10-05 ENCOUNTER — Ambulatory Visit: Payer: Self-pay | Admitting: Family Medicine

## 2007-10-05 LAB — CONVERTED CEMR LAB
Beta hcg, urine, semiquantitative: NEGATIVE
Cholesterol: 172 mg/dL (ref 0–200)
Triglycerides: 114 mg/dL (ref <150)

## 2007-10-06 ENCOUNTER — Encounter (INDEPENDENT_AMBULATORY_CARE_PROVIDER_SITE_OTHER): Payer: Self-pay | Admitting: Family Medicine

## 2007-10-12 ENCOUNTER — Telehealth: Payer: Self-pay | Admitting: *Deleted

## 2007-10-18 IMAGING — CT CT NECK W/ CM
1 of 2 series · 9 of 14 positions shown, 12 images · IV contrast (75CC OMNI 300)
Comparison: none

CLINICAL DATA: Draining fistula, anterior neck.   History of thyroglossal duct cyst surgery 12 years ago. 
 CT NECK WITH CONTRAST:
TECHNIQUE: Multidetector CT imaging of the neck was performed following the standard protocol during administration of intravenous contrast.
 Contrast:  75 cc Omnipaque 300

[Series 2: neck · axial · 0.39mm/px · z∈[+43,+240]mm · 9 of 188 slices shown, 12 images]
[im 19/188  soft-tissue]
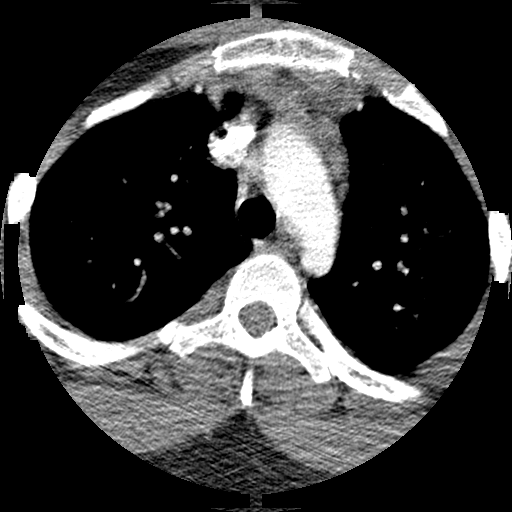
[im 19/188  bone]
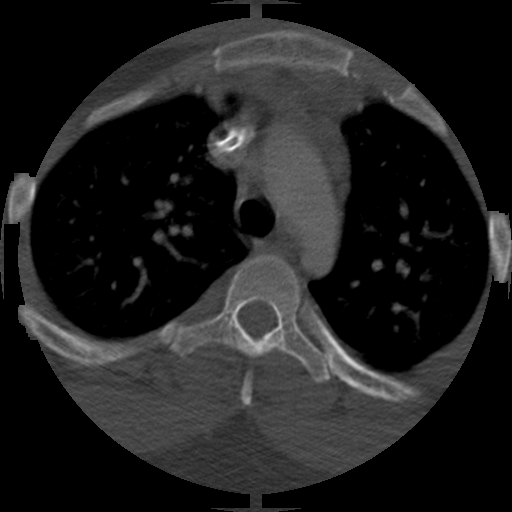
[im 38/188  bone]
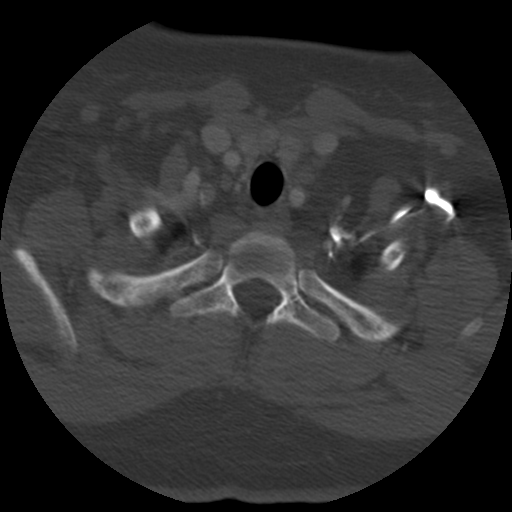
[im 57/188  bone]
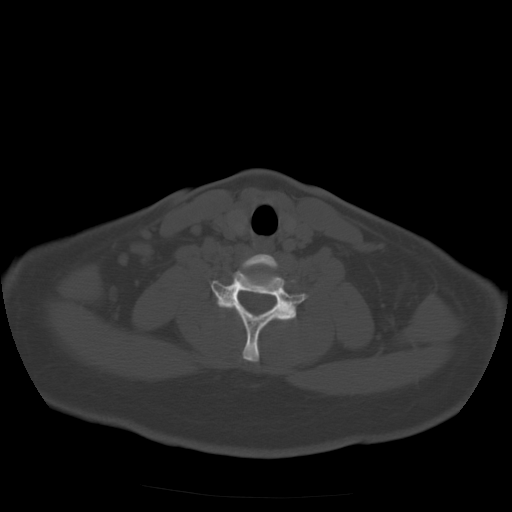
[im 75/188  bone]
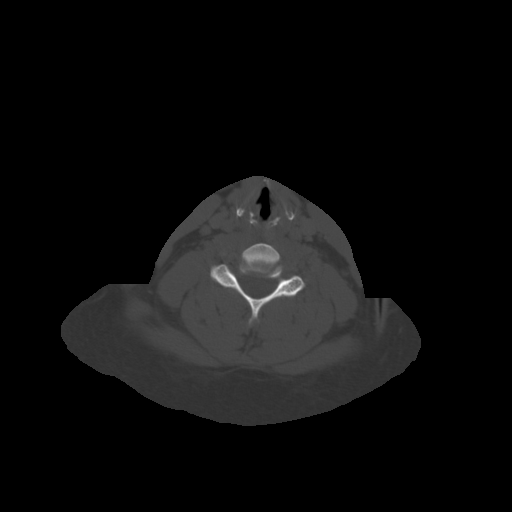
[im 94/188  soft-tissue]
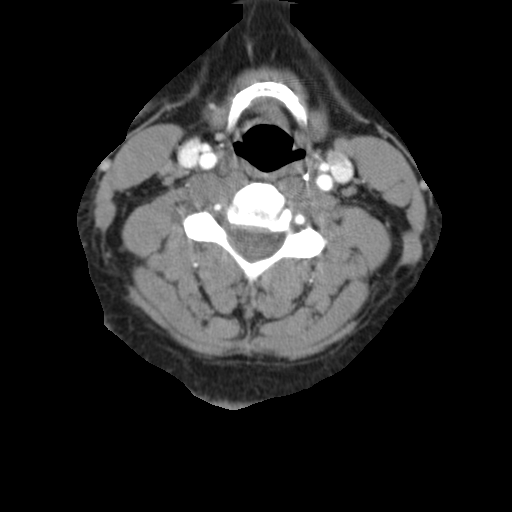
[im 94/188  bone]
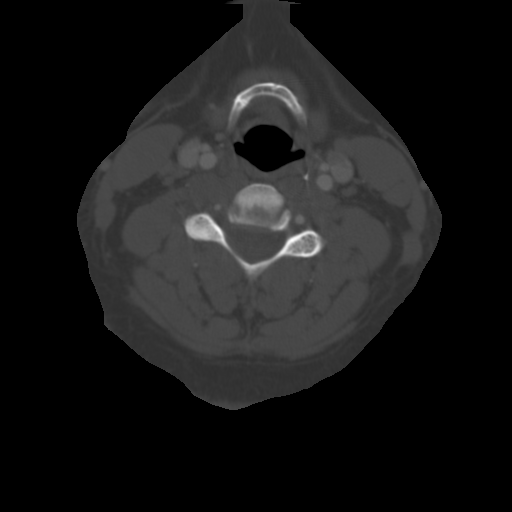
[im 113/188  bone]
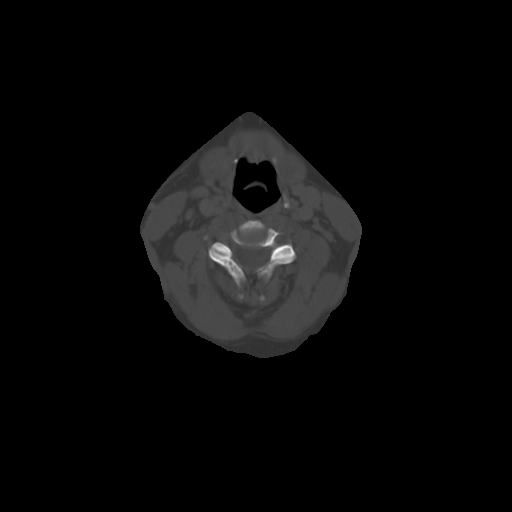
[im 131/188  bone]
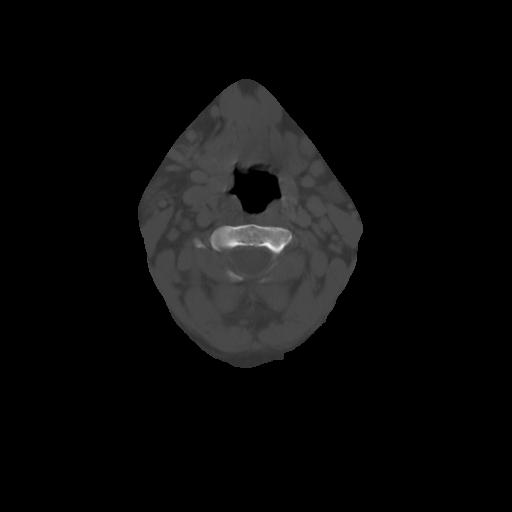
[im 150/188  bone]
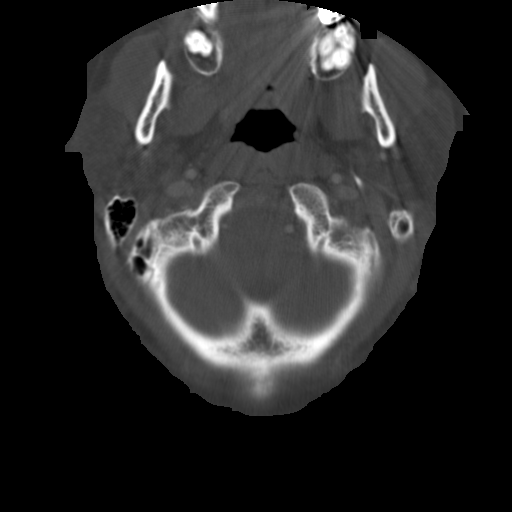
[im 169/188  soft-tissue]
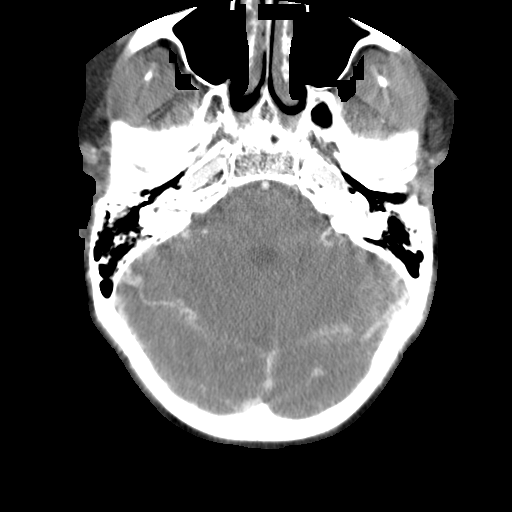
[im 169/188  bone]
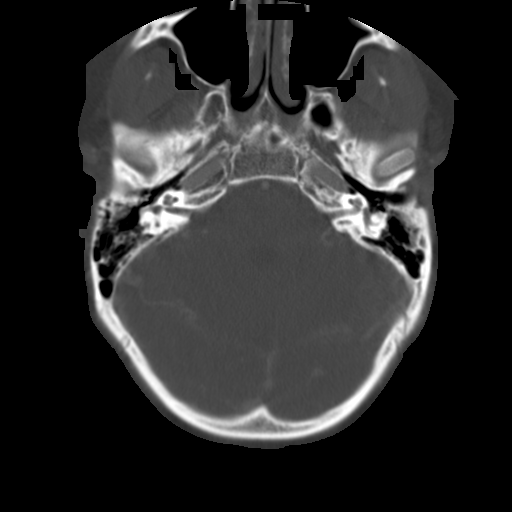

[9 of 14 positions shown; findings below may reference images not displayed]

FINDINGS: The base of the skull appears normal.  There is mucosal thickening in the left maxillary sinus.  The remainder of the paranasal sinuses are clear.  The tonsillar tissue appears normal. The level of the vallecula, aryepiglottic folds, piriform sinuses and true cords appears normal.  The subglottic airway is normal.  A small BB was placed over the area of concern in the anterior neck slightly to the right of midline on delayed images.  At that site, there is no evidence of a fistulous tract and no skin thickening is seen.  The thyroid gland is minimally inhomogeneous.  The lung apices are clear.  There are a few slightly prominent anterior cervical chain nodes bilaterally.  The largest node on the right is on image #43 measuring 12 x 15 mm with one of the larger nodes on the left on image #48 measuring 12 x 14 mm.  No supraclavicular adenopathy is seen.
IMPRESSION: 1.  No fistulous tract is seen at the area questioned clinically and no underlying mass or skin thickening is noted. 
 2.  Slightly prominent anterior cervical chain nodes bilaterally.
 3. Mucosal thickening in the left maxillary sinus.

## 2007-11-03 IMAGING — CR DG CHEST 2V
2 series · 2 of 2 positions shown · non-contrast
Comparison: 01/08/05.

CLINICAL DATA: 24 year old with draining fistula.  Nonsmoker.  Hypertension.
 CHEST - 2 VIEW:

[view not recorded (1 of 2)]
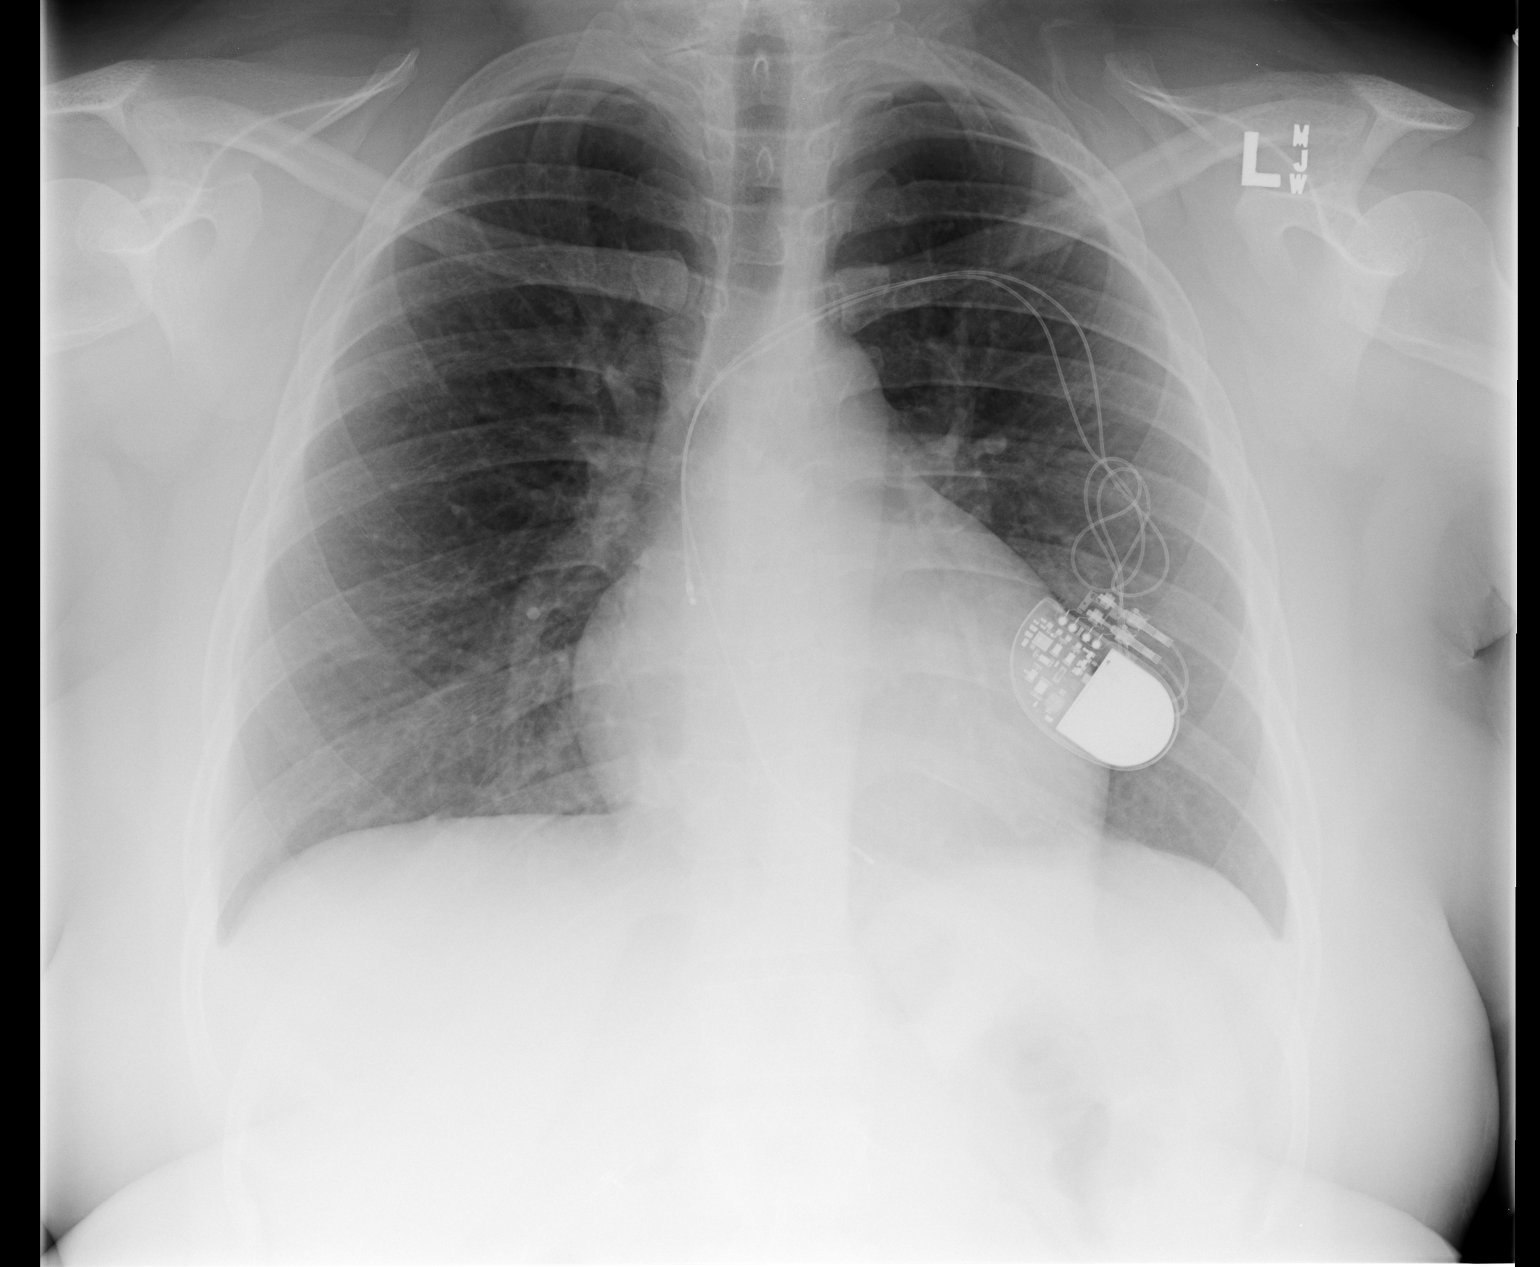

[view not recorded (2 of 2)]
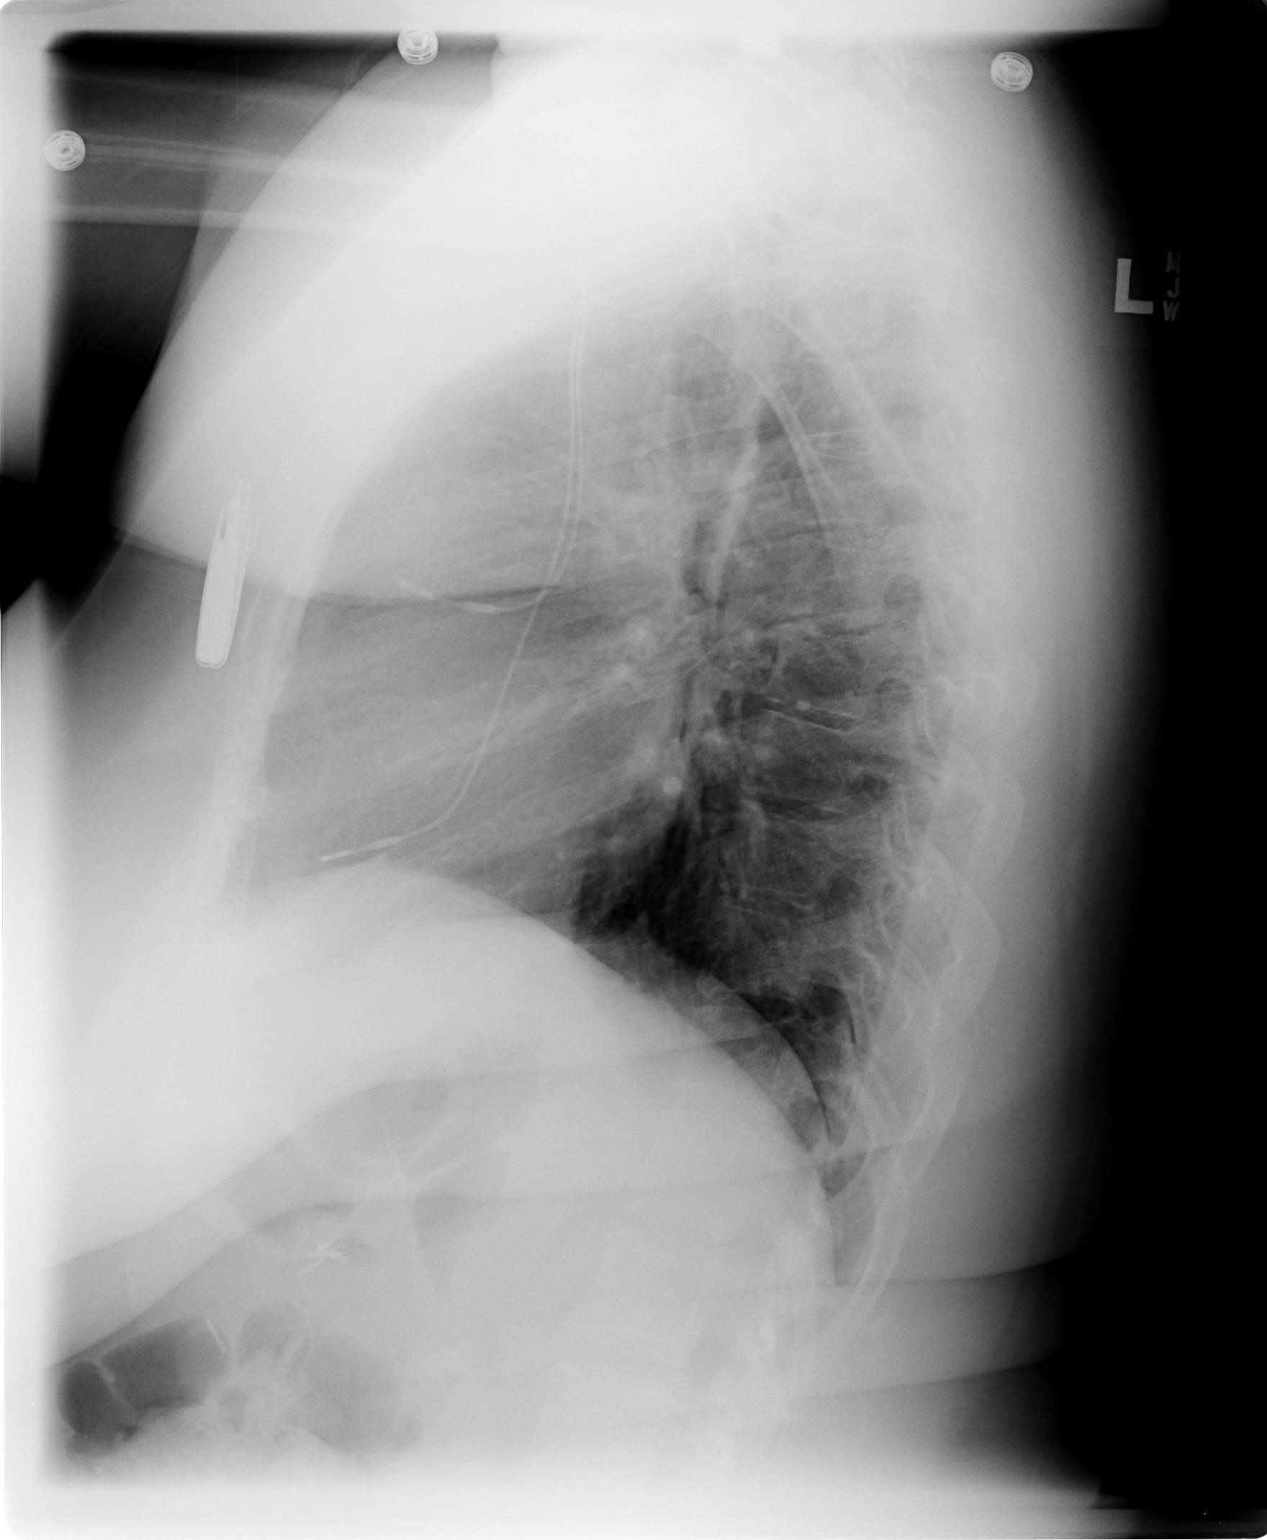

[2 of 2 positions shown; findings below may reference images not displayed]

FINDINGS: Left transvenous pacemaker leads overlie the right atrium and right ventricle.  The heart is mildly enlarged but there is no pulmonary edema.  There is no focal consolidation or pleural effusion.
IMPRESSION: Cardiomegaly without evidence for pulmonary edema.

## 2007-11-10 ENCOUNTER — Telehealth: Payer: Self-pay | Admitting: *Deleted

## 2007-11-16 ENCOUNTER — Ambulatory Visit: Payer: Self-pay | Admitting: Family Medicine

## 2007-12-04 ENCOUNTER — Emergency Department (HOSPITAL_COMMUNITY): Admission: EM | Admit: 2007-12-04 | Discharge: 2007-12-04 | Payer: Self-pay | Admitting: Emergency Medicine

## 2007-12-06 ENCOUNTER — Ambulatory Visit: Payer: Self-pay | Admitting: Sports Medicine

## 2007-12-08 ENCOUNTER — Ambulatory Visit: Payer: Self-pay | Admitting: Family Medicine

## 2007-12-08 ENCOUNTER — Telehealth: Payer: Self-pay | Admitting: *Deleted

## 2007-12-09 ENCOUNTER — Ambulatory Visit: Payer: Self-pay | Admitting: Family Medicine

## 2007-12-21 ENCOUNTER — Telehealth (INDEPENDENT_AMBULATORY_CARE_PROVIDER_SITE_OTHER): Payer: Self-pay | Admitting: Family Medicine

## 2007-12-22 ENCOUNTER — Ambulatory Visit (HOSPITAL_COMMUNITY): Admission: RE | Admit: 2007-12-22 | Discharge: 2007-12-22 | Payer: Self-pay | Admitting: Family Medicine

## 2007-12-23 ENCOUNTER — Ambulatory Visit: Payer: Self-pay | Admitting: Family Medicine

## 2007-12-23 ENCOUNTER — Encounter (INDEPENDENT_AMBULATORY_CARE_PROVIDER_SITE_OTHER): Payer: Self-pay | Admitting: Family Medicine

## 2007-12-23 DIAGNOSIS — Z950 Presence of cardiac pacemaker: Secondary | ICD-10-CM

## 2007-12-23 LAB — CONVERTED CEMR LAB
Beta hcg, urine, semiquantitative: NEGATIVE
CO2: 24 meq/L (ref 19–32)
Calcium: 9.1 mg/dL (ref 8.4–10.5)
Chloride: 107 meq/L (ref 96–112)
Creatinine, Ser: 0.71 mg/dL (ref 0.40–1.20)
Glucose, Bld: 99 mg/dL (ref 70–99)
HCT: 39.6 % (ref 36.0–46.0)
MCV: 89.2 fL (ref 78.0–100.0)
RBC: 4.44 M/uL (ref 3.87–5.11)
WBC: 4.2 10*3/uL (ref 4.0–10.5)

## 2007-12-24 ENCOUNTER — Telehealth (INDEPENDENT_AMBULATORY_CARE_PROVIDER_SITE_OTHER): Payer: Self-pay | Admitting: Family Medicine

## 2007-12-29 ENCOUNTER — Ambulatory Visit: Payer: Self-pay | Admitting: Family Medicine

## 2007-12-29 ENCOUNTER — Encounter (INDEPENDENT_AMBULATORY_CARE_PROVIDER_SITE_OTHER): Payer: Self-pay | Admitting: Family Medicine

## 2008-01-05 ENCOUNTER — Telehealth (INDEPENDENT_AMBULATORY_CARE_PROVIDER_SITE_OTHER): Payer: Self-pay | Admitting: Family Medicine

## 2008-01-07 ENCOUNTER — Ambulatory Visit: Payer: Self-pay | Admitting: Family Medicine

## 2008-01-18 ENCOUNTER — Ambulatory Visit: Payer: Self-pay | Admitting: Family Medicine

## 2008-02-03 ENCOUNTER — Encounter: Payer: Self-pay | Admitting: *Deleted

## 2008-02-14 ENCOUNTER — Telehealth: Payer: Self-pay | Admitting: *Deleted

## 2008-02-21 ENCOUNTER — Ambulatory Visit: Payer: Self-pay | Admitting: Sports Medicine

## 2008-03-23 ENCOUNTER — Encounter (INDEPENDENT_AMBULATORY_CARE_PROVIDER_SITE_OTHER): Payer: Self-pay | Admitting: Family Medicine

## 2008-03-23 ENCOUNTER — Ambulatory Visit: Payer: Self-pay | Admitting: Family Medicine

## 2008-03-23 DIAGNOSIS — K219 Gastro-esophageal reflux disease without esophagitis: Secondary | ICD-10-CM | POA: Insufficient documentation

## 2008-03-23 LAB — CONVERTED CEMR LAB: Pap Smear: NORMAL

## 2008-03-29 ENCOUNTER — Encounter (INDEPENDENT_AMBULATORY_CARE_PROVIDER_SITE_OTHER): Payer: Self-pay | Admitting: Family Medicine

## 2008-04-25 ENCOUNTER — Encounter (INDEPENDENT_AMBULATORY_CARE_PROVIDER_SITE_OTHER): Payer: Self-pay | Admitting: Family Medicine

## 2008-04-25 ENCOUNTER — Ambulatory Visit: Payer: Self-pay | Admitting: Family Medicine

## 2008-04-25 LAB — CONVERTED CEMR LAB
Albumin: 4.3 g/dL (ref 3.5–5.2)
Alkaline Phosphatase: 50 units/L (ref 39–117)
BUN: 12 mg/dL (ref 6–23)
Beta hcg, urine, semiquantitative: NEGATIVE
CO2: 22 meq/L (ref 19–32)
Glucose, Bld: 118 mg/dL — ABNORMAL HIGH (ref 70–99)
HCT: 40.1 % (ref 36.0–46.0)
Hemoglobin: 12.9 g/dL (ref 12.0–15.0)
MCHC: 32.2 g/dL (ref 30.0–36.0)
Potassium: 4 meq/L (ref 3.5–5.3)
RBC: 4.62 M/uL (ref 3.87–5.11)
RDW: 14.1 % (ref 11.5–15.5)
TSH: 2.924 microintl units/mL (ref 0.350–4.50)
Total Protein: 7.7 g/dL (ref 6.0–8.3)

## 2008-04-26 ENCOUNTER — Telehealth: Payer: Self-pay | Admitting: *Deleted

## 2008-05-09 ENCOUNTER — Telehealth: Payer: Self-pay | Admitting: *Deleted

## 2008-05-09 ENCOUNTER — Ambulatory Visit: Payer: Self-pay | Admitting: Family Medicine

## 2008-05-09 DIAGNOSIS — J302 Other seasonal allergic rhinitis: Secondary | ICD-10-CM

## 2008-05-09 DIAGNOSIS — J3089 Other allergic rhinitis: Secondary | ICD-10-CM

## 2008-05-31 ENCOUNTER — Telehealth: Payer: Self-pay | Admitting: *Deleted

## 2008-06-01 ENCOUNTER — Emergency Department (HOSPITAL_COMMUNITY): Admission: EM | Admit: 2008-06-01 | Discharge: 2008-06-01 | Payer: Self-pay | Admitting: Family Medicine

## 2008-06-23 ENCOUNTER — Encounter: Payer: Self-pay | Admitting: Family Medicine

## 2008-07-14 ENCOUNTER — Encounter: Payer: Self-pay | Admitting: *Deleted

## 2008-08-09 ENCOUNTER — Telehealth (INDEPENDENT_AMBULATORY_CARE_PROVIDER_SITE_OTHER): Payer: Self-pay | Admitting: *Deleted

## 2008-08-16 ENCOUNTER — Telehealth: Payer: Self-pay | Admitting: *Deleted

## 2008-08-21 ENCOUNTER — Ambulatory Visit: Payer: Self-pay | Admitting: Family Medicine

## 2008-09-11 ENCOUNTER — Telehealth: Payer: Self-pay | Admitting: *Deleted

## 2008-09-20 ENCOUNTER — Ambulatory Visit: Payer: Self-pay | Admitting: Family Medicine

## 2008-09-27 ENCOUNTER — Telehealth: Payer: Self-pay | Admitting: *Deleted

## 2008-10-04 ENCOUNTER — Encounter (INDEPENDENT_AMBULATORY_CARE_PROVIDER_SITE_OTHER): Payer: Self-pay | Admitting: Family Medicine

## 2008-10-05 ENCOUNTER — Telehealth: Payer: Self-pay | Admitting: *Deleted

## 2008-10-06 ENCOUNTER — Emergency Department (HOSPITAL_COMMUNITY): Admission: EM | Admit: 2008-10-06 | Discharge: 2008-10-06 | Payer: Self-pay | Admitting: Emergency Medicine

## 2008-10-25 ENCOUNTER — Ambulatory Visit: Payer: Self-pay | Admitting: Family Medicine

## 2008-10-28 ENCOUNTER — Emergency Department (HOSPITAL_COMMUNITY): Admission: EM | Admit: 2008-10-28 | Discharge: 2008-10-28 | Payer: Self-pay | Admitting: Family Medicine

## 2008-11-02 ENCOUNTER — Telehealth: Payer: Self-pay | Admitting: *Deleted

## 2008-11-28 ENCOUNTER — Encounter: Payer: Self-pay | Admitting: *Deleted

## 2008-11-28 ENCOUNTER — Telehealth: Payer: Self-pay | Admitting: *Deleted

## 2008-12-05 ENCOUNTER — Ambulatory Visit: Payer: Self-pay | Admitting: Family Medicine

## 2008-12-06 ENCOUNTER — Encounter (INDEPENDENT_AMBULATORY_CARE_PROVIDER_SITE_OTHER): Payer: Self-pay | Admitting: Family Medicine

## 2008-12-07 ENCOUNTER — Telehealth: Payer: Self-pay | Admitting: *Deleted

## 2008-12-08 ENCOUNTER — Ambulatory Visit: Payer: Self-pay | Admitting: Family Medicine

## 2009-01-10 ENCOUNTER — Encounter (INDEPENDENT_AMBULATORY_CARE_PROVIDER_SITE_OTHER): Payer: Self-pay | Admitting: Family Medicine

## 2009-01-11 ENCOUNTER — Ambulatory Visit: Payer: Self-pay | Admitting: Family Medicine

## 2009-01-11 ENCOUNTER — Telehealth: Payer: Self-pay | Admitting: Psychology

## 2009-01-11 DIAGNOSIS — J4520 Mild intermittent asthma, uncomplicated: Secondary | ICD-10-CM

## 2009-01-13 ENCOUNTER — Emergency Department (HOSPITAL_COMMUNITY): Admission: EM | Admit: 2009-01-13 | Discharge: 2009-01-13 | Payer: Self-pay | Admitting: Family Medicine

## 2009-01-23 ENCOUNTER — Encounter (INDEPENDENT_AMBULATORY_CARE_PROVIDER_SITE_OTHER): Payer: Self-pay | Admitting: Family Medicine

## 2009-02-07 ENCOUNTER — Ambulatory Visit: Payer: Self-pay | Admitting: Family Medicine

## 2009-02-07 ENCOUNTER — Encounter: Payer: Self-pay | Admitting: Psychology

## 2009-02-08 LAB — CONVERTED CEMR LAB
Calcium: 9.6 mg/dL (ref 8.4–10.5)
Glucose, Bld: 79 mg/dL (ref 70–99)
Lithium Lvl: 1.36 meq/L (ref 0.80–1.40)
Potassium: 4.3 meq/L (ref 3.5–5.3)
Sodium: 141 meq/L (ref 135–145)
TSH: 1.906 microintl units/mL (ref 0.350–4.500)

## 2009-02-09 ENCOUNTER — Telehealth: Payer: Self-pay | Admitting: Psychology

## 2009-02-19 ENCOUNTER — Telehealth: Payer: Self-pay | Admitting: Family Medicine

## 2009-02-20 ENCOUNTER — Ambulatory Visit: Payer: Self-pay | Admitting: Family Medicine

## 2009-03-08 ENCOUNTER — Encounter (INDEPENDENT_AMBULATORY_CARE_PROVIDER_SITE_OTHER): Payer: Self-pay | Admitting: Family Medicine

## 2009-03-20 ENCOUNTER — Telehealth: Payer: Self-pay | Admitting: *Deleted

## 2009-03-30 ENCOUNTER — Emergency Department (HOSPITAL_COMMUNITY): Admission: EM | Admit: 2009-03-30 | Discharge: 2009-03-31 | Payer: Self-pay | Admitting: Emergency Medicine

## 2009-06-20 ENCOUNTER — Encounter: Payer: Self-pay | Admitting: Family Medicine

## 2009-06-29 ENCOUNTER — Telehealth: Payer: Self-pay | Admitting: *Deleted

## 2009-06-30 ENCOUNTER — Emergency Department (HOSPITAL_COMMUNITY): Admission: EM | Admit: 2009-06-30 | Discharge: 2009-06-30 | Payer: Self-pay | Admitting: Emergency Medicine

## 2009-07-05 ENCOUNTER — Telehealth: Payer: Self-pay | Admitting: Family Medicine

## 2009-07-05 ENCOUNTER — Ambulatory Visit: Payer: Self-pay | Admitting: Family Medicine

## 2009-07-07 IMAGING — RF DG HYSTEROGRAM
5 series · 5 of 5 positions shown · IV contrast (omnipaque)
Comparison: none

CLINICAL DATA: Primary infertility.  Assess tubal patentcy.  
HYSTEROGRAM:
TECHNIQUE: Following sterile cleansing of the external cervical os with Betadine, hysterosalpingography was performed via placement of a 5-french hysterosalpingography catheter into the endocervical canal where it was stabilized with an end-catheter balloon.  Approximately 15 cc Omnipaque 300 was injected into the endometrial canal without difficulty.

[Series 1: run · 1 of 1 slices shown (1 of 5)]
[im 1/1]
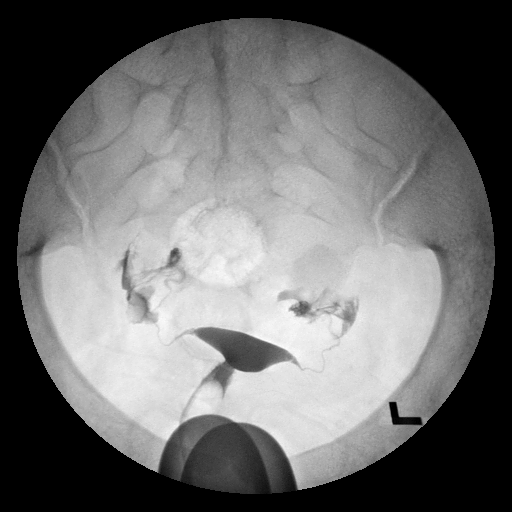

[Series 2: run · 1 of 1 slices shown (2 of 5)]
[im 1/1]
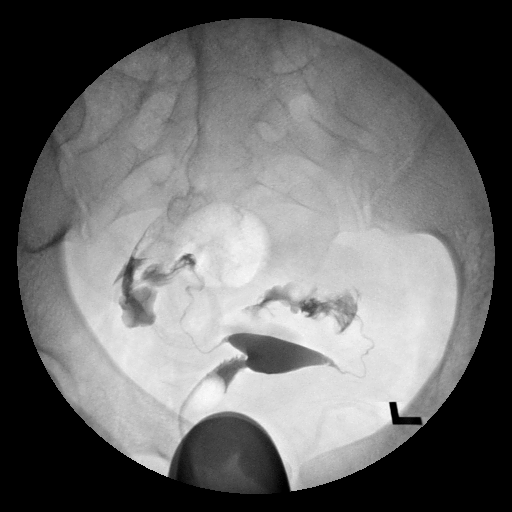

[Series 3: run · 1 of 1 slices shown (3 of 5)]
[im 1/1]
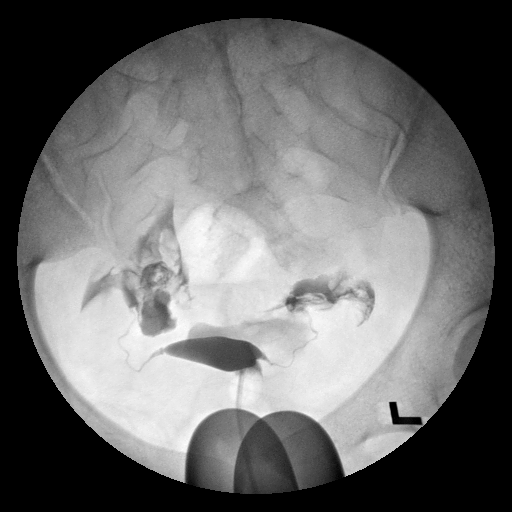

[Series 4: run · 1 of 1 slices shown (4 of 5)]
[im 1/1]
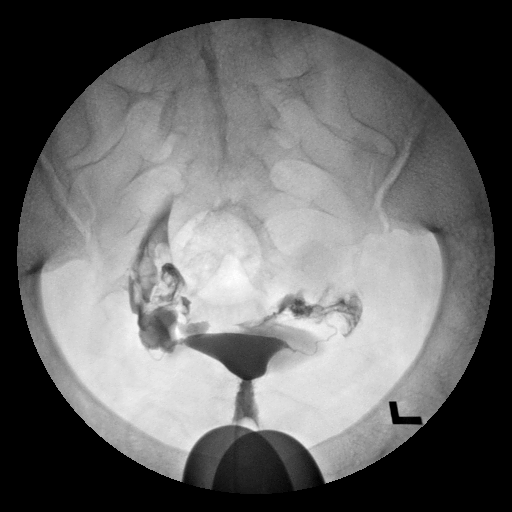

[Series 5: run · 1 of 1 slices shown (5 of 5)]
[im 1/1]
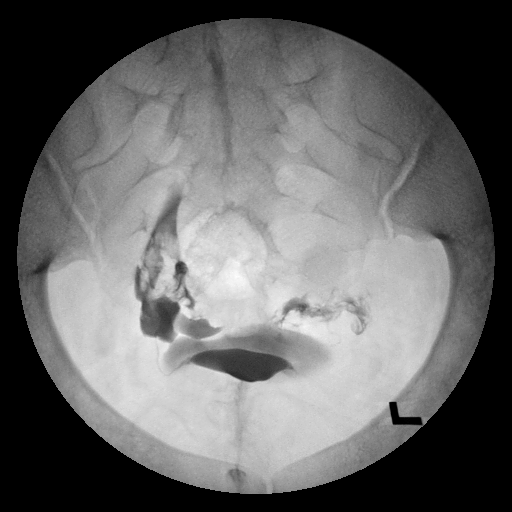

[5 of 5 positions shown; findings below may reference images not displayed]

FINDINGS: A normal endometrial morphology is seen.  Both fallopian tubes have a normal morphology.  Free intraperitoneal spill was documented on the left.  On the right, there is one small area of loculation of contrast immediately adjacent to the ampullary portion of the tubes.  Free intraperitoneal spill occurs on this side as well, but this suggests the possibility of some peritubal adhesions in this location.
IMPRESSION: Findings suggesting the possibility of some periampullary adhesions on the right with some focal loculation of contrast.  Free intraperitoneal spill was however also noted bilaterally.  Please see above report for more complete discussion.

## 2009-07-20 ENCOUNTER — Ambulatory Visit: Payer: Self-pay | Admitting: Family Medicine

## 2009-08-03 ENCOUNTER — Ambulatory Visit: Payer: Self-pay | Admitting: Family Medicine

## 2009-08-14 ENCOUNTER — Emergency Department (HOSPITAL_COMMUNITY): Admission: EM | Admit: 2009-08-14 | Discharge: 2009-08-14 | Payer: Self-pay | Admitting: Emergency Medicine

## 2009-08-17 ENCOUNTER — Ambulatory Visit: Payer: Self-pay | Admitting: Family Medicine

## 2009-08-18 ENCOUNTER — Emergency Department (HOSPITAL_COMMUNITY): Admission: EM | Admit: 2009-08-18 | Discharge: 2009-08-18 | Payer: Self-pay | Admitting: Family Medicine

## 2009-08-25 IMAGING — CR DG ABDOMEN ACUTE W/ 1V CHEST
3 series · 3 of 3 positions shown · non-contrast
Comparison: Two view chest 12/19/05.

CLINICAL DATA: Constipation, abdominal pain, difficulty urinating.
 ACUTE ABDOMINAL SERIES WITH CHEST ? 3 VIEWS ? 08/02/07:

[w chest pa]
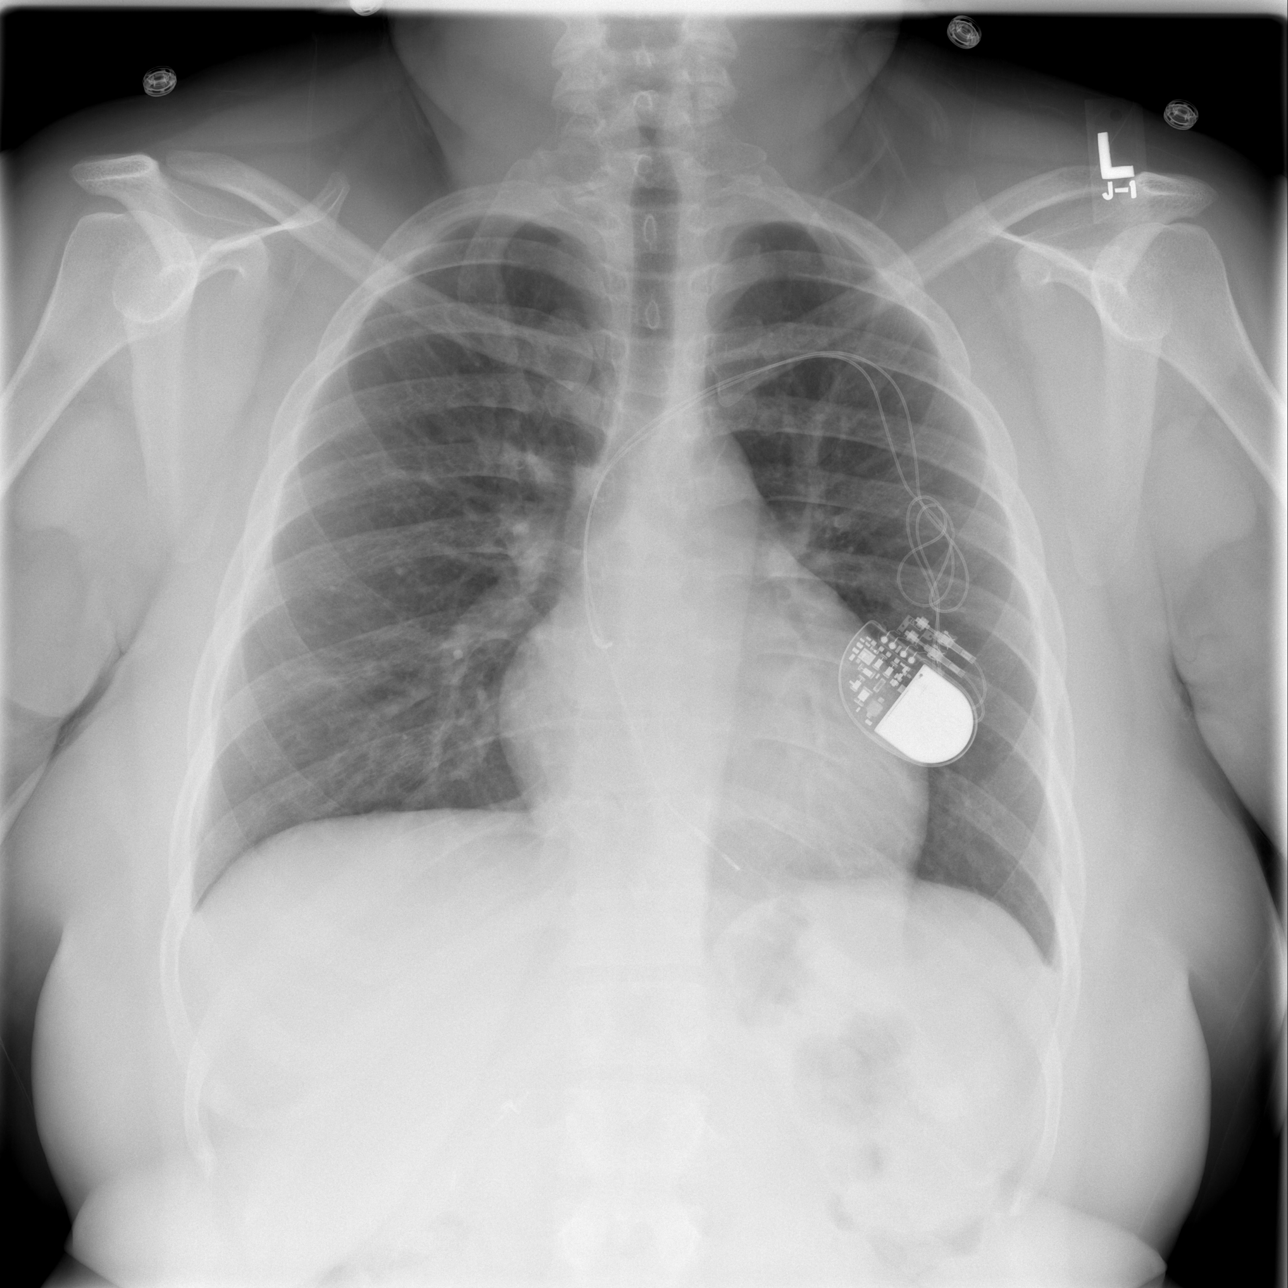

[w abdomen upright]
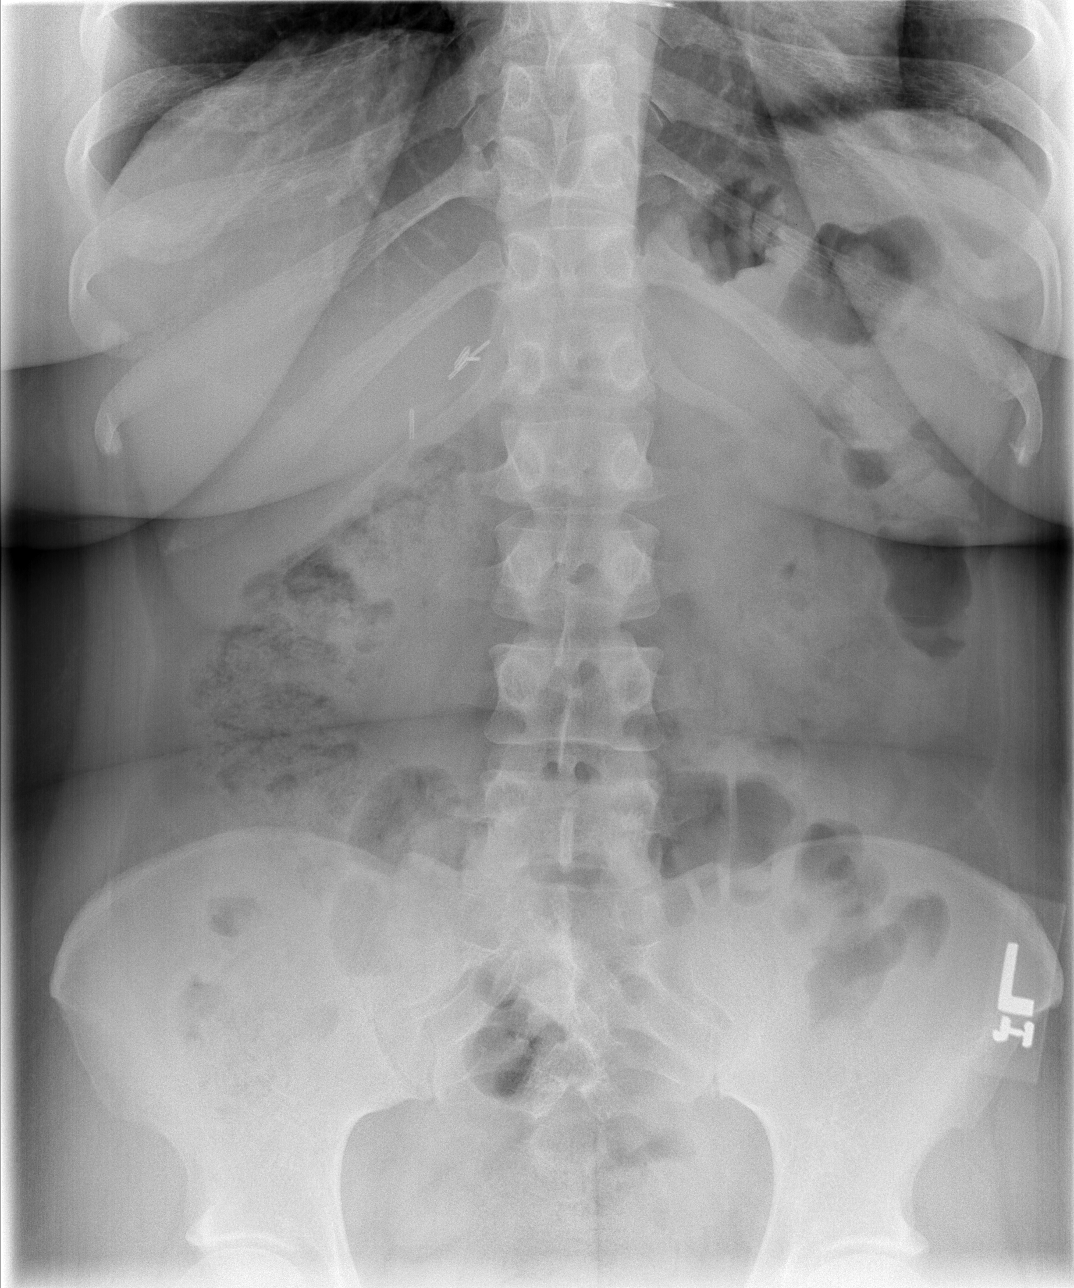

[t abdomen supine]
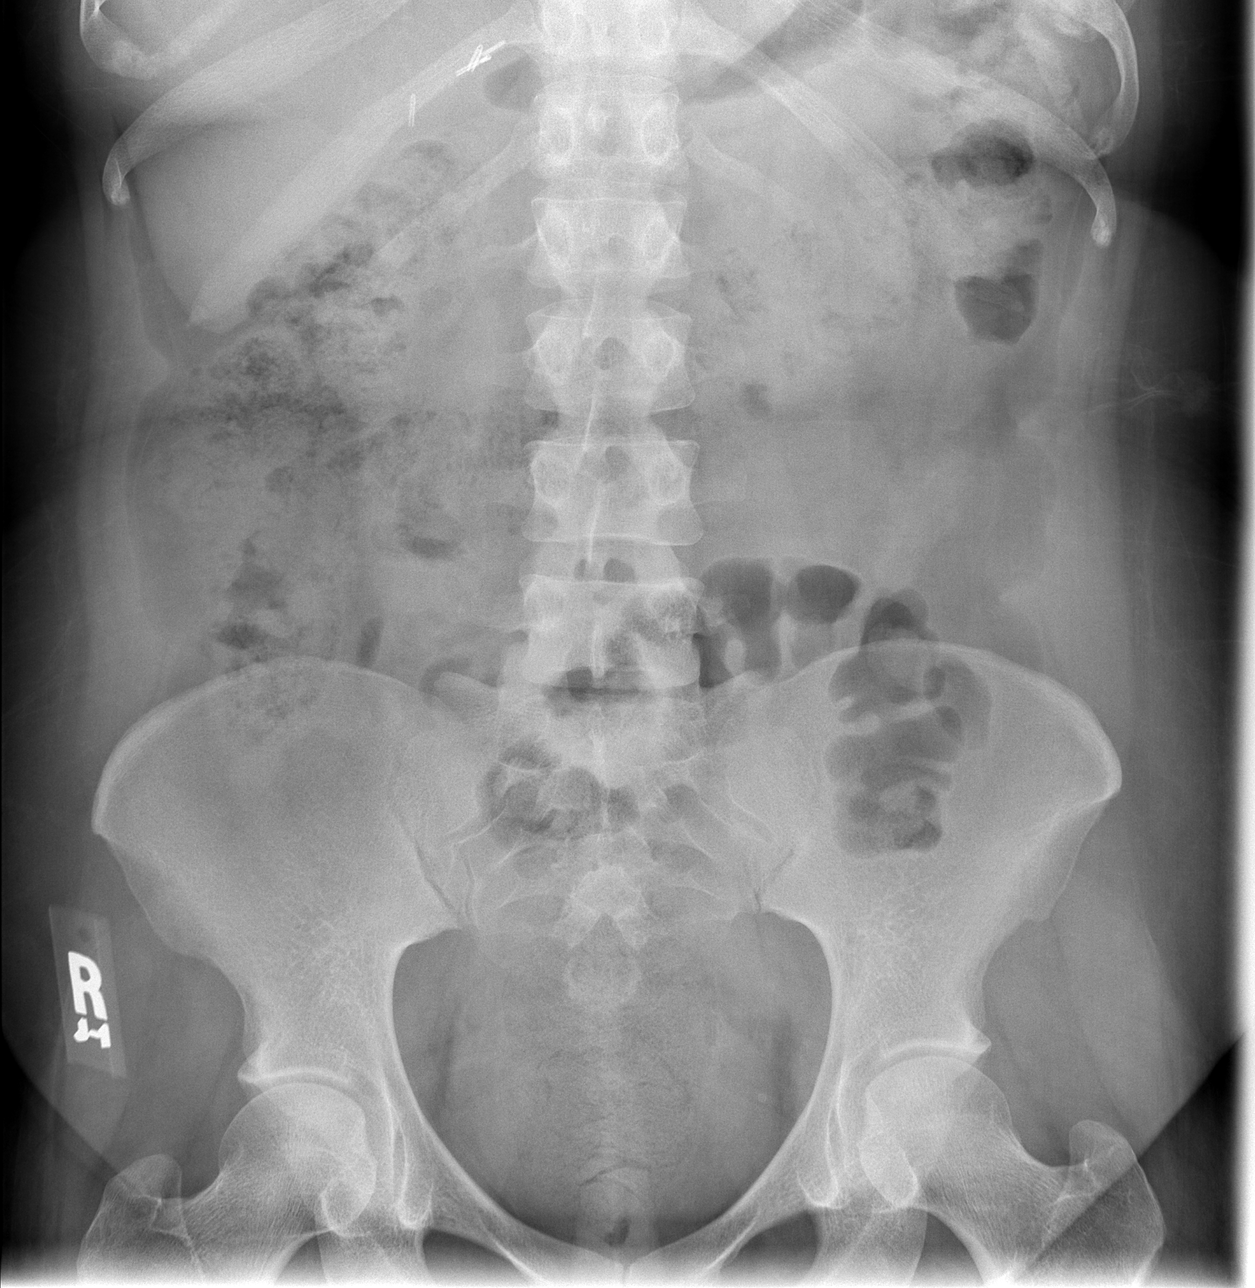

[3 of 3 positions shown; findings below may reference images not displayed]

FINDINGS: Frontal view of the chest shows a midline trachea and stable heart size.  The lungs are clear.  
 Two views of the abdomen show a moderate amount of stool in the colon.  No small bowel dilatation.
IMPRESSION: Constipation.

## 2009-09-24 ENCOUNTER — Ambulatory Visit: Payer: Self-pay | Admitting: Family Medicine

## 2009-09-24 DIAGNOSIS — R209 Unspecified disturbances of skin sensation: Secondary | ICD-10-CM | POA: Insufficient documentation

## 2009-09-25 ENCOUNTER — Emergency Department (HOSPITAL_COMMUNITY): Admission: EM | Admit: 2009-09-25 | Discharge: 2009-09-25 | Payer: Self-pay | Admitting: Family Medicine

## 2009-10-03 ENCOUNTER — Telehealth: Payer: Self-pay | Admitting: *Deleted

## 2009-10-12 ENCOUNTER — Encounter: Payer: Self-pay | Admitting: Family Medicine

## 2009-10-18 ENCOUNTER — Ambulatory Visit: Payer: Self-pay | Admitting: Family Medicine

## 2009-10-18 LAB — CONVERTED CEMR LAB
Bilirubin Urine: NEGATIVE
Glucose, Urine, Semiquant: NEGATIVE
Ketones, urine, test strip: NEGATIVE
Specific Gravity, Urine: 1.025
Urobilinogen, UA: 1
pH: 5.5

## 2009-10-19 ENCOUNTER — Encounter: Payer: Self-pay | Admitting: Family Medicine

## 2009-11-01 ENCOUNTER — Emergency Department (HOSPITAL_COMMUNITY): Admission: EM | Admit: 2009-11-01 | Discharge: 2009-11-01 | Payer: Self-pay | Admitting: Family Medicine

## 2009-11-06 ENCOUNTER — Telehealth: Payer: Self-pay | Admitting: Family Medicine

## 2009-11-14 ENCOUNTER — Encounter: Payer: Self-pay | Admitting: Family Medicine

## 2009-11-14 ENCOUNTER — Ambulatory Visit: Payer: Self-pay | Admitting: Family Medicine

## 2009-11-21 ENCOUNTER — Encounter (HOSPITAL_COMMUNITY): Admission: RE | Admit: 2009-11-21 | Discharge: 2010-01-17 | Payer: Self-pay | Admitting: Cardiology

## 2009-11-22 ENCOUNTER — Ambulatory Visit: Payer: Self-pay | Admitting: Family Medicine

## 2009-11-22 ENCOUNTER — Encounter: Payer: Self-pay | Admitting: Family Medicine

## 2009-11-22 DIAGNOSIS — Z8679 Personal history of other diseases of the circulatory system: Secondary | ICD-10-CM | POA: Insufficient documentation

## 2009-12-12 ENCOUNTER — Encounter: Payer: Self-pay | Admitting: Family Medicine

## 2009-12-12 ENCOUNTER — Ambulatory Visit: Payer: Self-pay | Admitting: Family Medicine

## 2009-12-18 ENCOUNTER — Encounter: Payer: Self-pay | Admitting: Family Medicine

## 2009-12-19 ENCOUNTER — Telehealth: Payer: Self-pay | Admitting: Family Medicine

## 2009-12-19 ENCOUNTER — Encounter: Payer: Self-pay | Admitting: Family Medicine

## 2009-12-19 ENCOUNTER — Ambulatory Visit: Payer: Self-pay | Admitting: Family Medicine

## 2009-12-21 ENCOUNTER — Telehealth: Payer: Self-pay | Admitting: Family Medicine

## 2009-12-22 ENCOUNTER — Emergency Department (HOSPITAL_COMMUNITY): Admission: EM | Admit: 2009-12-22 | Discharge: 2009-12-22 | Payer: Self-pay | Admitting: Family Medicine

## 2009-12-27 IMAGING — CR DG CHEST 2V
2 series · 2 of 2 positions shown · non-contrast
Comparison: none

CLINICAL DATA: Cough and wheezing

Chest 2 view:
Comparison 12/19/2005. Left subclavian pacemaker stable. Borderline cardiomegaly.
Vascular clips in the right upper abdomen. Lungs are clear. No effusion.
Visualized bones unremarkable.

[view not recorded (1 of 2)]
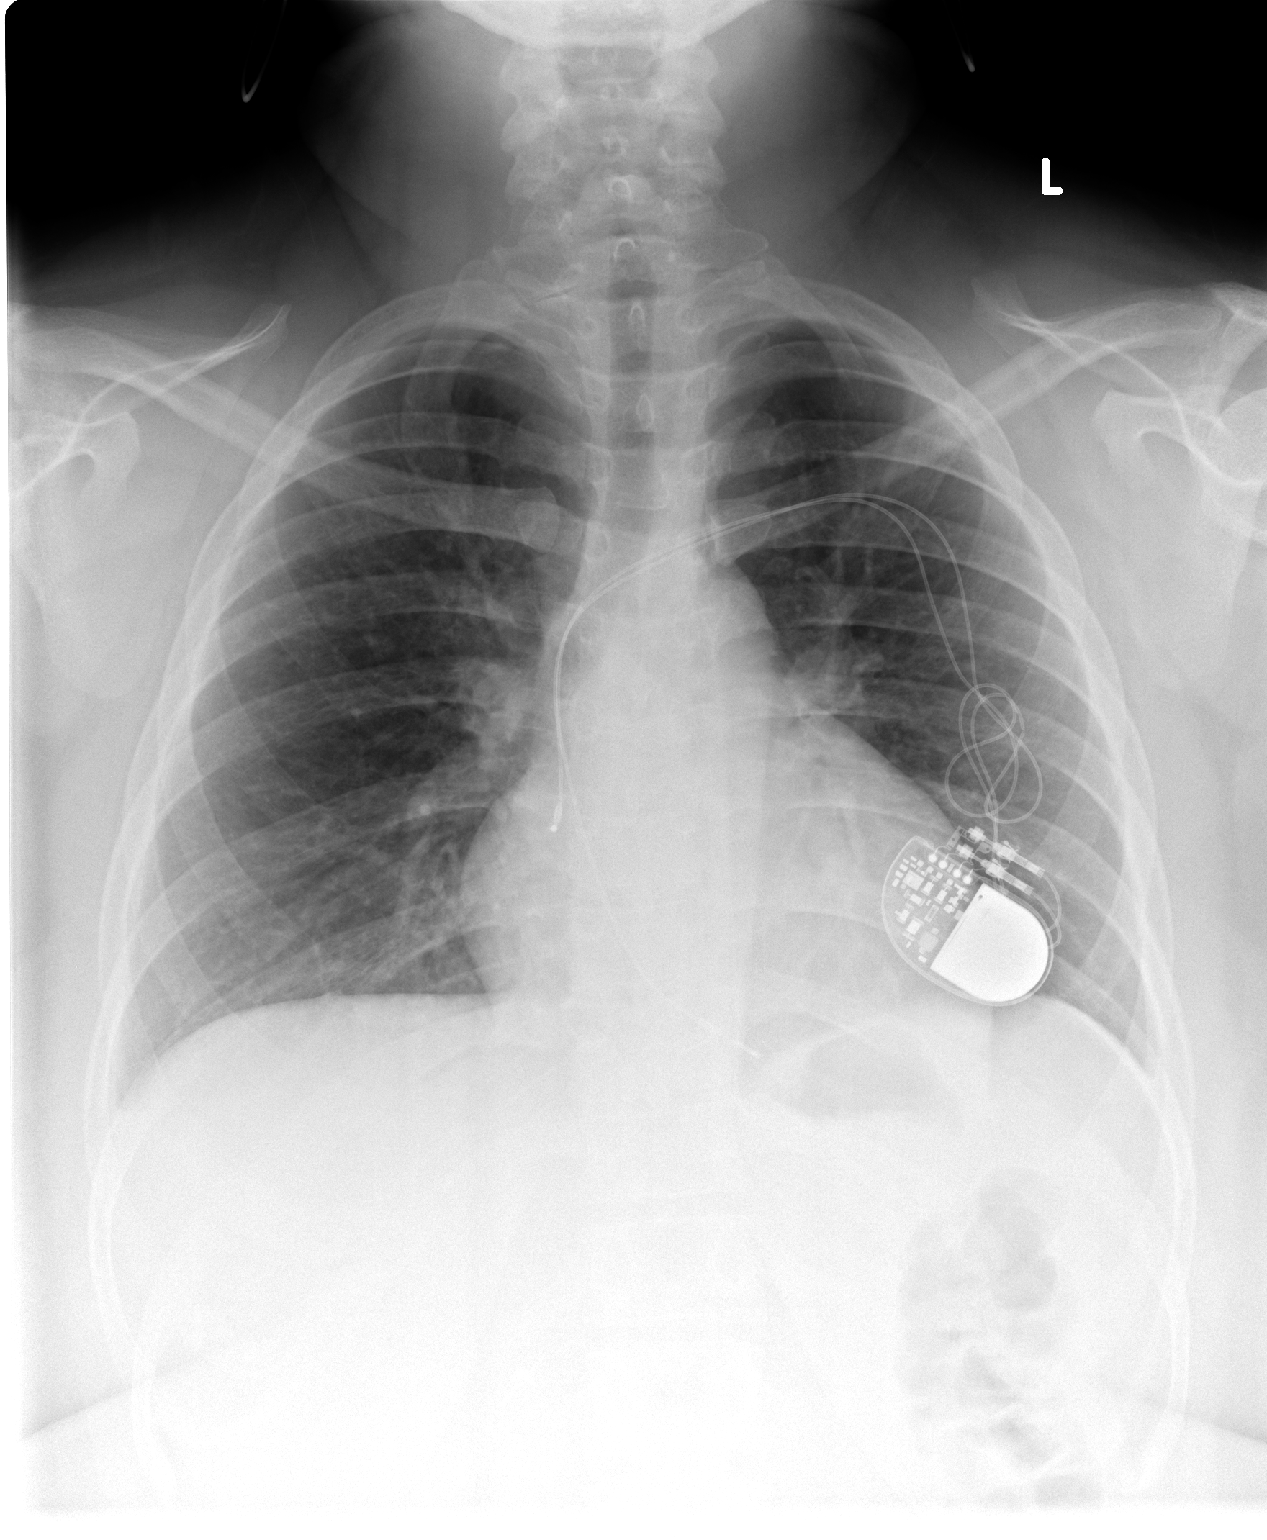

[view not recorded (2 of 2)]
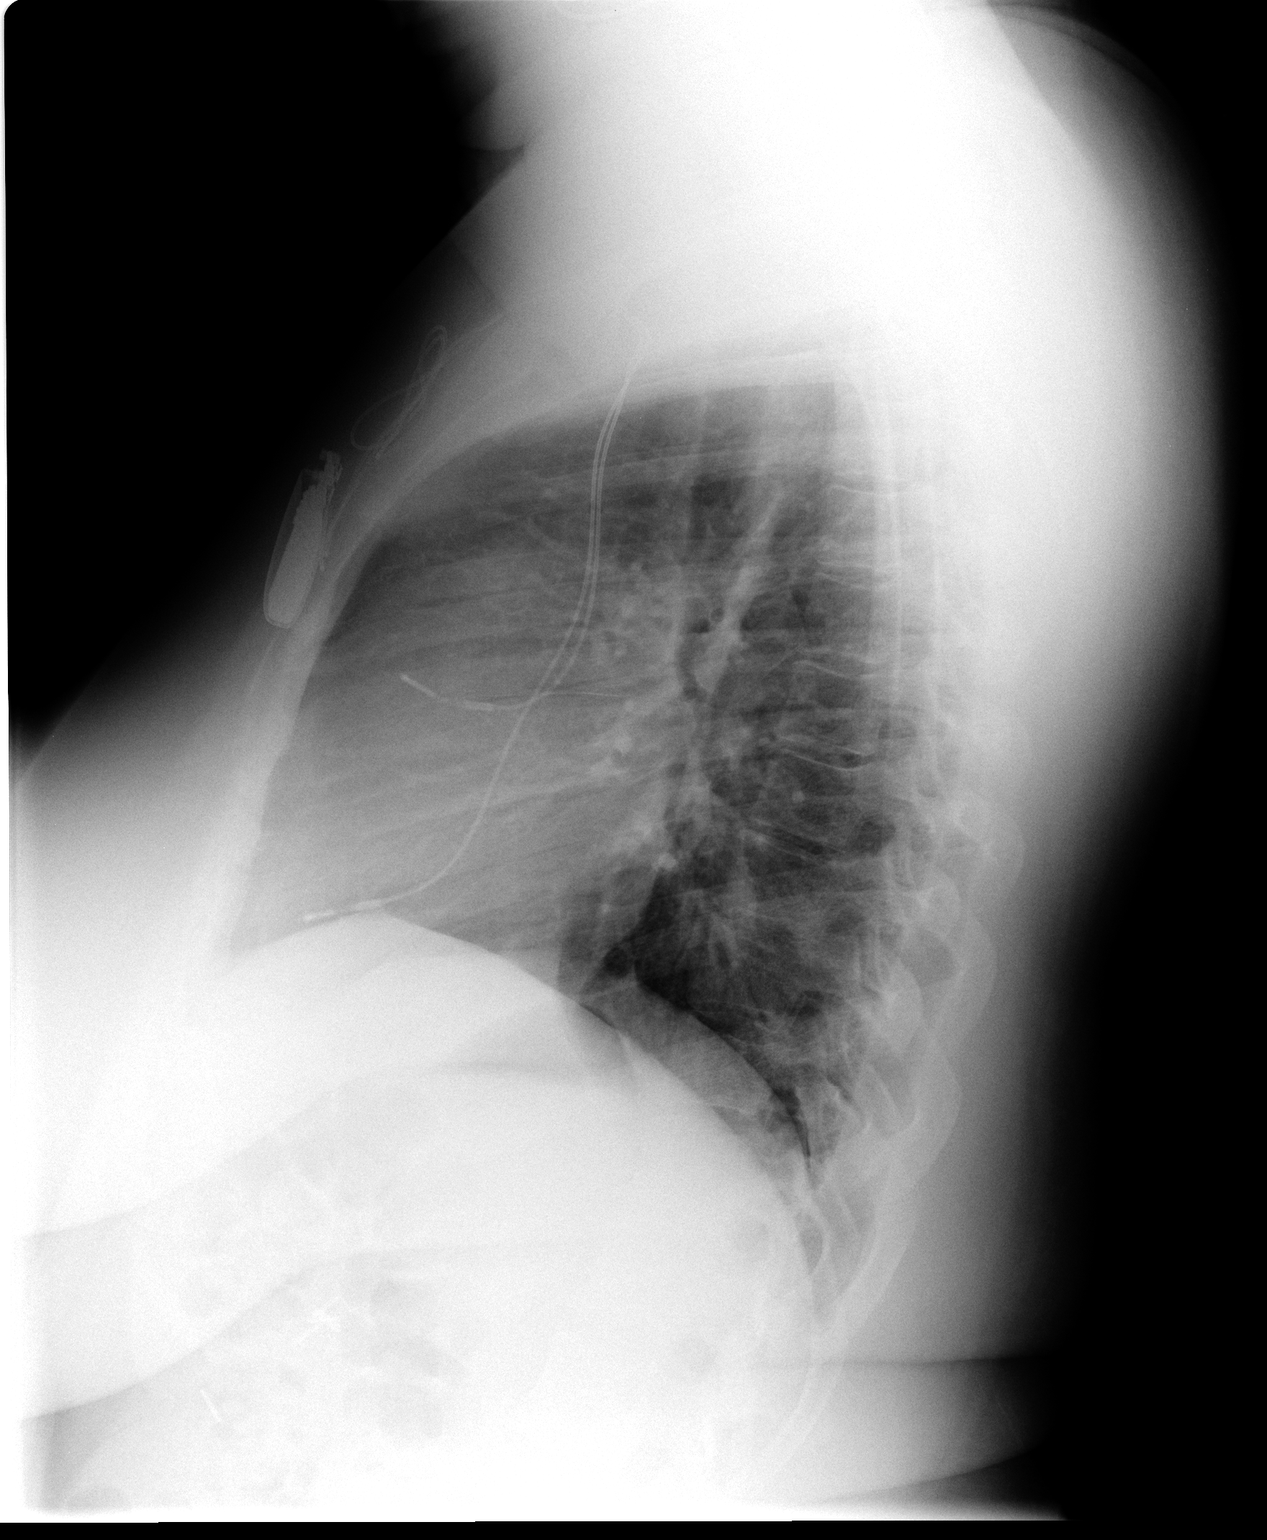

[2 of 2 positions shown; findings below may reference images not displayed]

IMPRESSION: 1. Stable postoperative changes.
2. No acute or superimposed abnormality

## 2010-02-10 ENCOUNTER — Emergency Department (HOSPITAL_COMMUNITY): Admission: EM | Admit: 2010-02-10 | Discharge: 2010-02-10 | Payer: Self-pay | Admitting: Family Medicine

## 2010-03-25 ENCOUNTER — Encounter: Payer: Self-pay | Admitting: Family Medicine

## 2010-03-28 ENCOUNTER — Telehealth: Payer: Self-pay | Admitting: Family Medicine

## 2010-03-28 ENCOUNTER — Ambulatory Visit: Payer: Self-pay | Admitting: Family Medicine

## 2010-04-02 ENCOUNTER — Ambulatory Visit: Payer: Self-pay | Admitting: Family Medicine

## 2010-04-03 ENCOUNTER — Emergency Department (HOSPITAL_COMMUNITY): Admission: EM | Admit: 2010-04-03 | Discharge: 2010-04-04 | Payer: Self-pay | Admitting: Emergency Medicine

## 2010-04-03 ENCOUNTER — Encounter: Payer: Self-pay | Admitting: Family Medicine

## 2010-04-04 ENCOUNTER — Ambulatory Visit: Payer: Self-pay | Admitting: Obstetrics & Gynecology

## 2010-04-04 ENCOUNTER — Inpatient Hospital Stay (HOSPITAL_COMMUNITY): Admission: AD | Admit: 2010-04-04 | Discharge: 2010-04-04 | Payer: Self-pay | Admitting: Obstetrics & Gynecology

## 2010-04-08 ENCOUNTER — Encounter: Payer: Self-pay | Admitting: Family Medicine

## 2010-04-08 ENCOUNTER — Ambulatory Visit: Payer: Self-pay | Admitting: Family Medicine

## 2010-04-08 DIAGNOSIS — O360990 Maternal care for other rhesus isoimmunization, unspecified trimester, not applicable or unspecified: Secondary | ICD-10-CM | POA: Insufficient documentation

## 2010-04-08 LAB — CONVERTED CEMR LAB
Antibody Screen: NEGATIVE
Basophils Absolute: 0 10*3/uL (ref 0.0–0.1)
Eosinophils Relative: 1 % (ref 0–5)
HCT: 39.6 % (ref 36.0–46.0)
Lymphocytes Relative: 34 % (ref 12–46)
Lymphs Abs: 1.3 10*3/uL (ref 0.7–4.0)
Neutrophils Relative %: 55 % (ref 43–77)
Platelets: 220 10*3/uL (ref 150–400)
RDW: 14.3 % (ref 11.5–15.5)
Rubella: 22.3 intl units/mL — ABNORMAL HIGH
Sickle Cell Screen: NEGATIVE
WBC: 3.7 10*3/uL — ABNORMAL LOW (ref 4.0–10.5)

## 2010-04-09 ENCOUNTER — Inpatient Hospital Stay (HOSPITAL_COMMUNITY): Admission: AD | Admit: 2010-04-09 | Discharge: 2010-04-10 | Payer: Self-pay | Admitting: Obstetrics & Gynecology

## 2010-04-09 ENCOUNTER — Telehealth: Payer: Self-pay | Admitting: Sports Medicine

## 2010-04-09 ENCOUNTER — Ambulatory Visit: Payer: Self-pay | Admitting: Advanced Practice Midwife

## 2010-04-19 ENCOUNTER — Telehealth: Payer: Self-pay | Admitting: Family Medicine

## 2010-05-07 ENCOUNTER — Ambulatory Visit: Payer: Self-pay | Admitting: Family Medicine

## 2010-05-07 ENCOUNTER — Encounter: Payer: Self-pay | Admitting: Family Medicine

## 2010-05-07 ENCOUNTER — Ambulatory Visit (HOSPITAL_COMMUNITY): Admission: RE | Admit: 2010-05-07 | Discharge: 2010-05-07 | Payer: Self-pay | Admitting: Family Medicine

## 2010-05-07 DIAGNOSIS — I499 Cardiac arrhythmia, unspecified: Secondary | ICD-10-CM | POA: Insufficient documentation

## 2010-05-07 DIAGNOSIS — R7302 Impaired glucose tolerance (oral): Secondary | ICD-10-CM | POA: Insufficient documentation

## 2010-05-07 LAB — CONVERTED CEMR LAB
Glucose, Bld: 76 mg/dL (ref 70–99)
TSH: 1.717 microintl units/mL (ref 0.350–4.500)

## 2010-05-10 ENCOUNTER — Telehealth: Payer: Self-pay | Admitting: *Deleted

## 2010-05-15 ENCOUNTER — Encounter: Payer: Self-pay | Admitting: Family Medicine

## 2010-05-15 ENCOUNTER — Inpatient Hospital Stay (HOSPITAL_COMMUNITY): Admission: AD | Admit: 2010-05-15 | Discharge: 2010-05-15 | Payer: Self-pay | Admitting: Obstetrics & Gynecology

## 2010-05-21 ENCOUNTER — Inpatient Hospital Stay (HOSPITAL_COMMUNITY): Admission: AD | Admit: 2010-05-21 | Discharge: 2010-05-22 | Payer: Self-pay | Admitting: Obstetrics & Gynecology

## 2010-05-21 ENCOUNTER — Encounter: Payer: Self-pay | Admitting: Family Medicine

## 2010-05-21 ENCOUNTER — Ambulatory Visit: Payer: Self-pay | Admitting: Obstetrics & Gynecology

## 2010-05-21 ENCOUNTER — Ambulatory Visit: Payer: Self-pay | Admitting: Family Medicine

## 2010-05-21 DIAGNOSIS — N760 Acute vaginitis: Secondary | ICD-10-CM | POA: Insufficient documentation

## 2010-05-21 LAB — CONVERTED CEMR LAB
Glucose, Urine, Semiquant: NEGATIVE
Nitrite: NEGATIVE
Specific Gravity, Urine: >=1.03
pH: 5.5

## 2010-05-22 ENCOUNTER — Encounter: Payer: Self-pay | Admitting: Family Medicine

## 2010-05-23 ENCOUNTER — Ambulatory Visit (HOSPITAL_COMMUNITY): Admission: RE | Admit: 2010-05-23 | Discharge: 2010-05-23 | Payer: Self-pay | Admitting: Family Medicine

## 2010-05-23 ENCOUNTER — Encounter: Payer: Self-pay | Admitting: Family Medicine

## 2010-05-27 ENCOUNTER — Encounter: Payer: Self-pay | Admitting: Family Medicine

## 2010-05-27 ENCOUNTER — Telehealth: Payer: Self-pay | Admitting: Family Medicine

## 2010-06-06 ENCOUNTER — Ambulatory Visit: Payer: Self-pay | Admitting: Family Medicine

## 2010-06-06 ENCOUNTER — Encounter: Payer: Self-pay | Admitting: Family Medicine

## 2010-06-06 ENCOUNTER — Telehealth: Payer: Self-pay | Admitting: Psychology

## 2010-06-06 DIAGNOSIS — N390 Urinary tract infection, site not specified: Secondary | ICD-10-CM | POA: Insufficient documentation

## 2010-06-13 ENCOUNTER — Encounter: Payer: Self-pay | Admitting: *Deleted

## 2010-06-20 ENCOUNTER — Encounter (INDEPENDENT_AMBULATORY_CARE_PROVIDER_SITE_OTHER): Payer: Self-pay | Admitting: Family Medicine

## 2010-06-20 ENCOUNTER — Ambulatory Visit: Payer: Self-pay | Admitting: Obstetrics and Gynecology

## 2010-06-20 ENCOUNTER — Encounter: Payer: Self-pay | Admitting: Family Medicine

## 2010-06-20 LAB — CONVERTED CEMR LAB
Chlamydia, DNA Probe: NEGATIVE
GC Probe Amp, Genital: NEGATIVE

## 2010-06-26 ENCOUNTER — Ambulatory Visit: Payer: Self-pay | Admitting: Family Medicine

## 2010-07-02 ENCOUNTER — Ambulatory Visit (HOSPITAL_COMMUNITY): Admission: RE | Admit: 2010-07-02 | Discharge: 2010-07-02 | Payer: Self-pay | Admitting: Family Medicine

## 2010-07-04 ENCOUNTER — Ambulatory Visit: Payer: Self-pay | Admitting: Family Medicine

## 2010-07-06 ENCOUNTER — Encounter: Payer: Self-pay | Admitting: Internal Medicine

## 2010-07-10 ENCOUNTER — Ambulatory Visit: Payer: Self-pay | Admitting: Cardiology

## 2010-07-12 ENCOUNTER — Inpatient Hospital Stay (HOSPITAL_COMMUNITY): Admission: AD | Admit: 2010-07-12 | Discharge: 2010-07-12 | Payer: Self-pay | Admitting: Obstetrics & Gynecology

## 2010-07-12 ENCOUNTER — Ambulatory Visit: Payer: Self-pay | Admitting: Obstetrics & Gynecology

## 2010-07-18 ENCOUNTER — Ambulatory Visit: Payer: Self-pay | Admitting: Family Medicine

## 2010-07-18 ENCOUNTER — Encounter: Payer: Self-pay | Admitting: Family Medicine

## 2010-07-18 DIAGNOSIS — Q892 Congenital malformations of other endocrine glands: Secondary | ICD-10-CM | POA: Insufficient documentation

## 2010-07-25 ENCOUNTER — Ambulatory Visit: Payer: Self-pay | Admitting: Obstetrics & Gynecology

## 2010-07-31 ENCOUNTER — Inpatient Hospital Stay (HOSPITAL_COMMUNITY): Admission: AD | Admit: 2010-07-31 | Discharge: 2010-07-31 | Payer: Self-pay | Admitting: Obstetrics & Gynecology

## 2010-08-07 ENCOUNTER — Ambulatory Visit: Payer: Self-pay | Admitting: Family Medicine

## 2010-08-07 ENCOUNTER — Inpatient Hospital Stay (HOSPITAL_COMMUNITY): Admission: AD | Admit: 2010-08-07 | Discharge: 2010-08-07 | Payer: Self-pay | Admitting: Obstetrics and Gynecology

## 2010-08-07 ENCOUNTER — Telehealth: Payer: Self-pay | Admitting: *Deleted

## 2010-08-13 ENCOUNTER — Ambulatory Visit (HOSPITAL_COMMUNITY): Admission: RE | Admit: 2010-08-13 | Discharge: 2010-08-13 | Payer: Self-pay | Admitting: Family Medicine

## 2010-08-13 ENCOUNTER — Encounter: Payer: Self-pay | Admitting: Family Medicine

## 2010-08-15 ENCOUNTER — Ambulatory Visit: Payer: Self-pay | Admitting: Obstetrics & Gynecology

## 2010-08-15 ENCOUNTER — Inpatient Hospital Stay (HOSPITAL_COMMUNITY): Admission: AD | Admit: 2010-08-15 | Discharge: 2010-08-16 | Payer: Self-pay | Admitting: Obstetrics & Gynecology

## 2010-08-16 ENCOUNTER — Telehealth: Payer: Self-pay | Admitting: *Deleted

## 2010-08-22 ENCOUNTER — Ambulatory Visit: Payer: Self-pay | Admitting: Family Medicine

## 2010-08-23 ENCOUNTER — Ambulatory Visit: Payer: Self-pay | Admitting: Family Medicine

## 2010-08-23 ENCOUNTER — Inpatient Hospital Stay (HOSPITAL_COMMUNITY): Admission: AD | Admit: 2010-08-23 | Discharge: 2010-08-23 | Payer: Self-pay | Admitting: Family Medicine

## 2010-09-05 ENCOUNTER — Ambulatory Visit: Payer: Self-pay | Admitting: Obstetrics and Gynecology

## 2010-09-05 ENCOUNTER — Encounter: Payer: Self-pay | Admitting: Family Medicine

## 2010-09-05 LAB — CONVERTED CEMR LAB
Antibody Screen: NEGATIVE
HCT: 33.2 % — ABNORMAL LOW (ref 36.0–46.0)
Hemoglobin: 10.8 g/dL — ABNORMAL LOW (ref 12.0–15.0)
RBC: 3.91 M/uL (ref 3.87–5.11)

## 2010-09-08 ENCOUNTER — Inpatient Hospital Stay (HOSPITAL_COMMUNITY)
Admission: AD | Admit: 2010-09-08 | Discharge: 2010-09-08 | Payer: Self-pay | Source: Home / Self Care | Admitting: Anesthesiology

## 2010-09-10 ENCOUNTER — Ambulatory Visit (HOSPITAL_COMMUNITY)
Admission: RE | Admit: 2010-09-10 | Discharge: 2010-09-10 | Payer: Self-pay | Source: Home / Self Care | Admitting: Family Medicine

## 2010-09-10 ENCOUNTER — Encounter: Payer: Self-pay | Admitting: Family Medicine

## 2010-09-19 ENCOUNTER — Ambulatory Visit: Payer: Self-pay | Admitting: Family Medicine

## 2010-09-20 ENCOUNTER — Ambulatory Visit: Payer: Self-pay | Admitting: Cardiology

## 2010-09-20 ENCOUNTER — Emergency Department (HOSPITAL_COMMUNITY)
Admission: EM | Admit: 2010-09-20 | Discharge: 2010-09-20 | Payer: Self-pay | Source: Home / Self Care | Admitting: Emergency Medicine

## 2010-09-26 ENCOUNTER — Encounter: Payer: Self-pay | Admitting: Obstetrics & Gynecology

## 2010-09-26 ENCOUNTER — Emergency Department (HOSPITAL_COMMUNITY): Admission: EM | Admit: 2010-09-26 | Discharge: 2009-11-20 | Payer: Self-pay | Admitting: Emergency Medicine

## 2010-09-26 ENCOUNTER — Ambulatory Visit: Payer: Self-pay | Admitting: Obstetrics & Gynecology

## 2010-09-27 ENCOUNTER — Ambulatory Visit: Payer: Self-pay | Admitting: Cardiovascular Disease

## 2010-09-30 ENCOUNTER — Inpatient Hospital Stay (HOSPITAL_COMMUNITY)
Admission: AD | Admit: 2010-09-30 | Discharge: 2010-09-30 | Payer: Self-pay | Source: Home / Self Care | Attending: Obstetrics & Gynecology | Admitting: Obstetrics & Gynecology

## 2010-10-03 ENCOUNTER — Encounter: Payer: Self-pay | Admitting: Cardiovascular Disease

## 2010-10-03 ENCOUNTER — Ambulatory Visit: Payer: Self-pay

## 2010-10-03 ENCOUNTER — Encounter: Payer: Self-pay | Admitting: Cardiology

## 2010-10-03 ENCOUNTER — Ambulatory Visit: Payer: Self-pay | Admitting: Obstetrics & Gynecology

## 2010-10-03 ENCOUNTER — Ambulatory Visit (HOSPITAL_COMMUNITY)
Admission: RE | Admit: 2010-10-03 | Discharge: 2010-10-03 | Payer: Self-pay | Source: Home / Self Care | Attending: Cardiology | Admitting: Cardiology

## 2010-10-10 ENCOUNTER — Inpatient Hospital Stay (HOSPITAL_COMMUNITY)
Admission: AD | Admit: 2010-10-10 | Discharge: 2010-10-10 | Payer: Self-pay | Source: Home / Self Care | Attending: Obstetrics & Gynecology | Admitting: Obstetrics & Gynecology

## 2010-10-10 ENCOUNTER — Ambulatory Visit: Payer: Self-pay | Admitting: Family Medicine

## 2010-10-12 ENCOUNTER — Inpatient Hospital Stay (HOSPITAL_COMMUNITY)
Admission: AD | Admit: 2010-10-12 | Discharge: 2010-10-12 | Payer: Self-pay | Source: Home / Self Care | Attending: Obstetrics & Gynecology | Admitting: Obstetrics & Gynecology

## 2010-10-14 ENCOUNTER — Inpatient Hospital Stay (HOSPITAL_COMMUNITY)
Admission: AD | Admit: 2010-10-14 | Discharge: 2010-10-14 | Disposition: A | Discharge status: Still In Hospital | Payer: Self-pay | Source: Home / Self Care | Attending: Obstetrics & Gynecology | Admitting: Obstetrics & Gynecology

## 2010-10-17 ENCOUNTER — Ambulatory Visit: Payer: Self-pay | Admitting: Obstetrics & Gynecology

## 2010-10-18 ENCOUNTER — Inpatient Hospital Stay (HOSPITAL_COMMUNITY)
Admission: AD | Admit: 2010-10-18 | Discharge: 2010-10-19 | Payer: Self-pay | Source: Home / Self Care | Attending: Obstetrics & Gynecology | Admitting: Obstetrics & Gynecology

## 2010-10-22 ENCOUNTER — Ambulatory Visit (HOSPITAL_COMMUNITY)
Admission: RE | Admit: 2010-10-22 | Discharge: 2010-10-22 | Payer: Self-pay | Source: Home / Self Care | Attending: Family Medicine | Admitting: Family Medicine

## 2010-10-24 ENCOUNTER — Ambulatory Visit
Admission: RE | Admit: 2010-10-24 | Discharge: 2010-10-24 | Payer: Self-pay | Source: Home / Self Care | Attending: Obstetrics & Gynecology | Admitting: Obstetrics & Gynecology

## 2010-10-24 LAB — POCT URINALYSIS DIPSTICK
Bilirubin Urine: NEGATIVE
Ketones, ur: NEGATIVE mg/dL
Nitrite: NEGATIVE
Protein, ur: 30 mg/dL — AB
Specific Gravity, Urine: >=1.03 (ref 1.005–1.030)
Urine Glucose, Fasting: NEGATIVE mg/dL
Urobilinogen, UA: 1 mg/dL (ref 0.0–1.0)
pH: 6 (ref 5.0–8.0)

## 2010-10-25 ENCOUNTER — Inpatient Hospital Stay (HOSPITAL_COMMUNITY)
Admission: AD | Admit: 2010-10-25 | Discharge: 2010-10-26 | Payer: Self-pay | Source: Home / Self Care | Attending: Family Medicine | Admitting: Family Medicine

## 2010-10-25 ENCOUNTER — Encounter: Payer: Self-pay | Admitting: Family Medicine

## 2010-10-28 ENCOUNTER — Inpatient Hospital Stay (HOSPITAL_COMMUNITY)
Admission: AD | Admit: 2010-10-28 | Discharge: 2010-10-28 | Payer: Self-pay | Source: Home / Self Care | Attending: Obstetrics and Gynecology | Admitting: Obstetrics and Gynecology

## 2010-10-30 ENCOUNTER — Telehealth: Payer: Self-pay | Admitting: Family Medicine

## 2010-10-31 ENCOUNTER — Encounter: Payer: Self-pay | Admitting: Obstetrics & Gynecology

## 2010-10-31 ENCOUNTER — Ambulatory Visit: Payer: Self-pay | Admitting: Cardiology

## 2010-10-31 ENCOUNTER — Ambulatory Visit
Admission: RE | Admit: 2010-10-31 | Discharge: 2010-10-31 | Payer: Self-pay | Source: Home / Self Care | Attending: Obstetrics and Gynecology | Admitting: Obstetrics and Gynecology

## 2010-10-31 LAB — CONVERTED CEMR LAB
Chlamydia, DNA Probe: NEGATIVE
GC Probe Amp, Genital: NEGATIVE

## 2010-11-01 ENCOUNTER — Encounter: Payer: Self-pay | Admitting: Obstetrics & Gynecology

## 2010-11-01 ENCOUNTER — Encounter: Payer: Self-pay | Admitting: Family Medicine

## 2010-11-04 ENCOUNTER — Inpatient Hospital Stay (HOSPITAL_COMMUNITY)
Admission: AD | Admit: 2010-11-04 | Discharge: 2010-11-04 | Payer: Self-pay | Source: Home / Self Care | Attending: Obstetrics and Gynecology | Admitting: Obstetrics and Gynecology

## 2010-11-04 LAB — URINALYSIS, ROUTINE W REFLEX MICROSCOPIC
Bilirubin Urine: NEGATIVE
Bilirubin Urine: NEGATIVE
Hgb urine dipstick: NEGATIVE
Hgb urine dipstick: NEGATIVE
Ketones, ur: NEGATIVE mg/dL
Ketones, ur: NEGATIVE mg/dL
Nitrite: NEGATIVE
Nitrite: NEGATIVE
Protein, ur: NEGATIVE mg/dL
Protein, ur: NEGATIVE mg/dL
Specific Gravity, Urine: <1.005 — ABNORMAL LOW (ref 1.005–1.030)
Specific Gravity, Urine: 1.01 (ref 1.005–1.030)
Urine Glucose, Fasting: NEGATIVE mg/dL
Urine Glucose, Fasting: NEGATIVE mg/dL
Urobilinogen, UA: 0.2 mg/dL (ref 0.0–1.0)
Urobilinogen, UA: 0.2 mg/dL (ref 0.0–1.0)
pH: 6 (ref 5.0–8.0)
pH: 6 (ref 5.0–8.0)

## 2010-11-04 LAB — URINE MICROSCOPIC-ADD ON

## 2010-11-04 LAB — POCT URINALYSIS DIPSTICK
Bilirubin Urine: NEGATIVE
Hgb urine dipstick: NEGATIVE
Ketones, ur: NEGATIVE mg/dL
Nitrite: NEGATIVE
Protein, ur: NEGATIVE mg/dL
Specific Gravity, Urine: 1.025 (ref 1.005–1.030)
Urine Glucose, Fasting: NEGATIVE mg/dL
Urobilinogen, UA: 1 mg/dL (ref 0.0–1.0)
pH: 6 (ref 5.0–8.0)

## 2010-11-06 ENCOUNTER — Ambulatory Visit: Admit: 2010-11-06 | Payer: Self-pay

## 2010-11-07 ENCOUNTER — Ambulatory Visit (HOSPITAL_COMMUNITY)
Admission: RE | Admit: 2010-11-07 | Discharge: 2010-11-07 | Payer: Self-pay | Source: Home / Self Care | Attending: Family Medicine | Admitting: Family Medicine

## 2010-11-07 ENCOUNTER — Ambulatory Visit
Admission: RE | Admit: 2010-11-07 | Discharge: 2010-11-07 | Payer: Self-pay | Source: Home / Self Care | Attending: Obstetrics and Gynecology | Admitting: Obstetrics and Gynecology

## 2010-11-07 ENCOUNTER — Ambulatory Visit (HOSPITAL_COMMUNITY)
Admission: RE | Admit: 2010-11-07 | Discharge: 2010-11-07 | Payer: Self-pay | Source: Home / Self Care | Attending: Obstetrics & Gynecology | Admitting: Obstetrics & Gynecology

## 2010-11-08 ENCOUNTER — Encounter: Payer: Self-pay | Admitting: Obstetrics & Gynecology

## 2010-11-08 ENCOUNTER — Telehealth: Payer: Self-pay | Admitting: Family Medicine

## 2010-11-10 ENCOUNTER — Encounter: Payer: Self-pay | Admitting: Family Medicine

## 2010-11-11 ENCOUNTER — Ambulatory Visit
Admission: RE | Admit: 2010-11-11 | Discharge: 2010-11-11 | Payer: Self-pay | Source: Home / Self Care | Attending: Obstetrics and Gynecology | Admitting: Obstetrics and Gynecology

## 2010-11-11 LAB — POCT URINALYSIS DIPSTICK
Ketones, ur: NEGATIVE mg/dL
Protein, ur: NEGATIVE mg/dL
Specific Gravity, Urine: 1.01 (ref 1.005–1.030)
Urobilinogen, UA: 0.2 mg/dL (ref 0.0–1.0)
pH: 6 (ref 5.0–8.0)

## 2010-11-13 ENCOUNTER — Inpatient Hospital Stay (HOSPITAL_COMMUNITY)
Admission: AD | Admit: 2010-11-13 | Discharge: 2010-11-13 | Payer: Self-pay | Source: Home / Self Care | Attending: Obstetrics & Gynecology | Admitting: Obstetrics & Gynecology

## 2010-11-14 ENCOUNTER — Ambulatory Visit
Admission: RE | Admit: 2010-11-14 | Discharge: 2010-11-14 | Payer: Self-pay | Source: Home / Self Care | Attending: Family Medicine | Admitting: Family Medicine

## 2010-11-14 ENCOUNTER — Ambulatory Visit: Admit: 2010-11-14 | Payer: Self-pay | Admitting: Obstetrics and Gynecology

## 2010-11-14 ENCOUNTER — Encounter: Payer: Self-pay | Admitting: Family Medicine

## 2010-11-14 ENCOUNTER — Ambulatory Visit (HOSPITAL_COMMUNITY)
Admission: RE | Admit: 2010-11-14 | Discharge: 2010-11-14 | Payer: Self-pay | Source: Home / Self Care | Attending: Obstetrics & Gynecology | Admitting: Obstetrics & Gynecology

## 2010-11-14 LAB — POCT URINALYSIS DIPSTICK
Bilirubin Urine: NEGATIVE
Hgb urine dipstick: NEGATIVE
Nitrite: NEGATIVE
Specific Gravity, Urine: 1.02 (ref 1.005–1.030)
pH: 6 (ref 5.0–8.0)

## 2010-11-15 LAB — HERPES SIMPLEX VIRUS CULTURE: Culture: NOT DETECTED

## 2010-11-17 ENCOUNTER — Inpatient Hospital Stay (HOSPITAL_COMMUNITY)
Admission: AD | Admit: 2010-11-17 | Discharge: 2010-11-17 | Payer: Self-pay | Source: Home / Self Care | Attending: Obstetrics & Gynecology | Admitting: Obstetrics & Gynecology

## 2010-11-18 ENCOUNTER — Other Ambulatory Visit: Payer: Self-pay | Admitting: Family Medicine

## 2010-11-18 ENCOUNTER — Inpatient Hospital Stay (HOSPITAL_COMMUNITY)
Admission: AD | Admit: 2010-11-18 | Discharge: 2010-11-18 | Payer: Self-pay | Source: Home / Self Care | Attending: Family Medicine | Admitting: Family Medicine

## 2010-11-18 ENCOUNTER — Ambulatory Visit: Admit: 2010-11-18 | Payer: Self-pay | Admitting: Obstetrics and Gynecology

## 2010-11-19 ENCOUNTER — Encounter: Payer: Self-pay | Admitting: Family Medicine

## 2010-11-19 ENCOUNTER — Ambulatory Visit
Admission: RE | Admit: 2010-11-19 | Discharge: 2010-11-19 | Payer: Self-pay | Source: Home / Self Care | Attending: Family Medicine | Admitting: Family Medicine

## 2010-11-19 ENCOUNTER — Ambulatory Visit (HOSPITAL_COMMUNITY)
Admission: RE | Admit: 2010-11-19 | Discharge: 2010-11-19 | Payer: Self-pay | Source: Home / Self Care | Attending: Family Medicine | Admitting: Family Medicine

## 2010-11-19 NOTE — Assessment & Plan Note (Signed)
Summary: neck infection,tcb   Vital Signs:  Patient profile:   30 year old female Height:      65.75 inches Weight:      235 pounds BMI:     38.36 Temp:     98.8 degrees F oral BP sitting:   102 / 58  (left arm) Cuff size:   regular  Vitals Entered By: Tessie Fass CMA (August 07, 2010 8:59 AM) CC: cyst on neck   Primary Care Provider:  Doree Albee MD  CC:  cyst on neck.  History of Present Illness: 30 yo F:  1. Cyst: Abscess draining on midline of anterior neck.  Has been there > two weeks. Is recurrent. Mildly tender.  No surrounding erythemia or pain.  No trouble swallowing or sore throat. Pt is annoyed by the draining cyst. Saw Dr. 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 However could be other skin abscess.  Plan to reduce inflimation of keflex (avoid Doxy, or Bactrim due to pregnancy).  Will also send to ENT for further workup and management.  Definitive treatment when baby delivered in Feb.  Denies fever, HA, N/V/D, other rash.  2. Pregnancy: Followed by high-risk.  Current Medications (verified): 1)  Cvs Prenatal 28-0.8 Mg  Tabs (Prenatal Vit-Fe Fumarate-Fa) .[name].. 1 By Mouth Daily (May Substitute) 2)  Ventolin Hfa 108 (90 Base) Mcg/act Aers (Albuterol Sulfate) .... 2 Puffs Every 4 Hours As Needed Wheezing 3)  Polyethylene Glycol 3350  Powd (Polyethylene Glycol 3350) .Marland KitchenMarland KitchenMarland Kitchen 17 Grams in 8 Ounces of Water Two Times A Day Until Bowel Movement, Then Once Daily.  Disp Qs X1 Month. 4)  Metrogel-Vaginal 0.75 % Gel (Metronidazole) .... Sig: Apply 5g Per Vagina At Bedtime, For 5 Days Disp Quant Sufficient 5 Days 5)  Keflex 500 Mg Caps (Cephalexin) .[name].. 1 By Mouth Two Times A Day X 7days  Allergies (verified): 1)  Zoloft (Sertraline Hcl) PMH-FH-SH reviewed for relevance  Review of Systems      See HPI  Physical Exam  General:  VS ntoed. Well NAD. Neck:  Large neck girth, full ROM. See Skin section Skin:  1cm by 0.25 cm pink tissue  midline in anterior neck just superior to the thryoid cartliage.  Tender to palpation. No fluid expressed today. No surrounding induration. No fluctuance noted.    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 However could be other skin abscess.  Plan to reduce inflamation with keflex (due to pregnancy).  Will also send to ENT for further workup and management.  Will follow up as needed.  Red flags given. Previous wound culture = gram + cocci cluster  Orders: [facility]- Est Level  3 ([address])  Complete Medication List: 1)  Cvs Prenatal 28-0.8 Mg Tabs (Prenatal vit-fe fumarate-fa) .[name].. 1 by mouth daily (may substitute) 2)  Ventolin Hfa 108 (90 Base) Mcg/act Aers (Albuterol sulfate) .... 2 puffs every 4 hours as needed wheezing 3)  Polyethylene Glycol 3350 Powd (Polyethylene glycol 3350) .Marland KitchenMarland KitchenMarland Kitchen 17 grams in 8 ounces of water two times a day until bowel movement, then once daily.  disp qs x1 month. 4)  Metrogel-vaginal 0.75 % Gel (Metronidazole) .... Sig: apply 5g per vagina at bedtime, for 5 days disp quant sufficient 5 days 5)  Keflex 500 Mg Caps (Cephalexin) .[name].. 1 by mouth two times a day x 7days  Patient Instructions:  1)  Thank you for seeing me today. 2)  If you have chest pain, difficulty breathing, fevers over 102 that does not get better with tylenol please call us or see a doctor.  3)  Take the antibiotics for 1 week.   4)  Follow up with Dr. Alvester Morin.  5)  We will call you about the ENT appt.  Prescriptions: KEFLEX 500 MG CAPS (CEPHALEXIN) 1 by mouth two times a day x 7days  #14 x 0   Entered and Authorized by:   Helane Rima DO   Signed by:   Helane Rima DO on 08/07/2010   Method used:   Electronically to        [facility]  [address] [id number]       [address]       [place], [place]  [address]       Ph: [phone] or  [phone]       Fax: [phone]   RxID:   [id number]    Orders Added: 1)  [facility]- Est Level  3 [[address]]     Flowsheet View for Follow-up Visit    Estimated weeks of       gestation:     23 5/7    Weight:     235    Blood pressure:   102 / 58

## 2010-11-19 NOTE — Letter (Signed)
Summary: Generic Letter  [facility]  [address]   [place], [place] [address]   Phone: [phone]  Fax: [phone]    06/13/2010 MRN: [id number]  [address] [place], [place]  [address]  Dear Ms. Gruen,  Because you are now being followed at the high risk clinic at [facility], we have cancelled your 9/19 appointment with Dr. Alvester Morin at [facility].  Please feel free to contact us if you have any questions.         Sincerely,   Dennison Nancy RN [facility]

## 2010-11-19 NOTE — Consult Note (Signed)
Summary: [facility]  [facility]   Imported By: De Nurse 05/29/2010 14:03:07  _____________________________________________________________________  External Attachment:    Type:   Image     Comment:   External Document

## 2010-11-19 NOTE — Progress Notes (Signed)
Summary: triage  Phone Note Call from Patient Call back at [phone]   Caller: Patient Summary of Call: Pt is 6 months pregnant and wondering what she can take for a cold? Initial call taken by: Clydell Hakim,  August 16, 2010 3:56 PM  Follow-up for Phone Call        Sx are mostly congesiton and runny nose.  Told her the only thing she could take would be Tylenol which is not very helpful for those symptoms.  Recommend OTC treatments and well has humidifier.  Pt agreeable. Follow-up by: Dennison Nancy RN,  August 16, 2010 4:08 PM

## 2010-11-19 NOTE — Letter (Signed)
Summary: Out of Work  [facility]  [address]   [place], [place] [address]   Phone: [phone]  Fax: [phone]    December 19, 2009   Employee:  DANNELLE RHYMES    To Whom It May Concern:   For Medical reasons, please excuse the above named employee from work for the following dates:  Start:   December 17, 2009   End:  December 20, 2009  If you need additional information, please feel free to contact our office.  Sincerely,    Romero Belling MD

## 2010-11-19 NOTE — Miscellaneous (Signed)
Summary: ROI  ROI   Imported By: Clydell Hakim 11/15/2009 16:09:47  _____________________________________________________________________  External Attachment:    Type:   Image     Comment:   External Document

## 2010-11-19 NOTE — Progress Notes (Signed)
Summary: Schedule MDC   Phone Note Call from Patient   Caller: Patient Call For: Spero Geralds, Psy.D. Summary of Call: Patient called to schedule an appt in [facility] per the recommendation of her physician.  Last seen in April, 2010.  She is not interested in starting medication.  She uses her dog for therapy and believes the dog's help has been superior to any medication.  Did not attempt to convince patient.  Suggested that if she had no interest in starting a medicine and thought that she was doing fine with the help of her dog, that it would likely prove frustrating to her to come to [facility].  She said that she might consider medication.  We scheduled for 06/26/2010.  I will touch base with Dr. Alvester Morin for further information.  There is a mention in patient instructions about getting records from a psychiatrist but she reports she does not have one.  Will clarify.  Will use [facility] appt to explore ambivalence. Initial call taken by: Spero Geralds PsyD,  June 06, 2010 4:43 PM

## 2010-11-19 NOTE — Assessment & Plan Note (Signed)
Summary: gi bug/[place] NEEDS NOTE FOR WORK   Vital Signs:  Patient profile:   30 year old female Height:      65.9 inches Weight:      214 pounds BMI:     34.77 Temp:     98.5 degrees F oral Pulse rate:   79 / minute BP sitting:   121 / 73  (right arm) Cuff size:   regular  Vitals Entered By: Garen Grams LPN (December 19, 1189 1:40 PM) CC: diarrhea and stomach pain x 2 days Is Patient Diabetic? No Pain Assessment Patient in pain? yes     Location: stomach   Primary Care Provider:  Romero Belling MD  CC:  diarrhea and stomach pain x 2 days.  History of Present Illness: 30 yo female with nonbloody diarrhea up to 8 times a day for the past 2 days.  Associated with nausea, stomach cramps, chills.  Denies fever, vomiting, dysuria, urinary frequency, abdominal pain, vaginal discharge.  Has decreased UOP, and is trying to keep up with fluid intake by mouth though liquids don't taste good right now.  Works in a nursing home.  Is not taking any medicines for this illness.  Habits & Providers  Alcohol-Tobacco-Diet     Tobacco Status: never  Allergies (verified): 1)  Zoloft (Sertraline Hcl)  Social History: Smoking Status:  never  Physical Exam  Additional Exam:  VITALS:  Reviewed, normal GEN: Alert & oriented, no acute distress CARDIO: Regular rate and rhythm, no murmurs/rubs/gallops, 2+ bilateral radial pulses RESP: Clear to auscultation, normal work of breathing, no retractions/accessory muscle use ABD: Normoactive bowel sounds, nontender, no masses/hepatosplenomegaly MOUTH:  Moist oral mucosa.    Impression & Recommendations:  Problem # 1:  GASTROENTERITIS, ACUTE (ICD-558.9) Does not appear dehydrated on exam--moist mucous membranes, normal HR. Supportive care--advised push fluids.  See patient instructions.  Work note provided.  Orders: [facility]- Est Level  3 (478[address])  Complete Medication List: 1)  Abilify 15 Mg Tabs (Aripiprazole) .[name].. 1 tab by mouth daily for bipolar  disorder 2)  Veramyst 27.5 Mcg/spray Susp (Fluticasone furoate) .... 2 sprays per nostril daily 3)  Cvs Prenatal 28-0.8 Mg Tabs (Prenatal vit-fe fumarate-fa) .[name].. 1 by mouth daily (may substitute) 4)  Cetirizine Hcl 10 Mg Tabs (Cetirizine hcl) .... One at bedtime 5)  Ventolin Hfa 108 (90 Base) Mcg/act Aers (Albuterol sulfate) .... 2 puffs every 4 hours as needed wheezing 6)  Polyethylene Glycol 3350 Powd (Polyethylene glycol 3350) .Marland KitchenMarland KitchenMarland Kitchen 17 grams in 8 ounces of water two times a day until bowel movement, then once daily.  disp qs x1 month. 7)  Ranitidine Hcl 150 Mg Tabs (Ranitidine hcl) .[name].. 1 tab by mouth two times a day for reflux 8)  Metformin Hcl 500 Mg Tabs (Metformin hcl) .[name].. 1 tab by mouth daily for pcos 9)  Naprosyn 500 Mg Tabs (Naproxen) .... Take one by mouth two times a day with food x 10 days then as needed pain  Patient Instructions: 1)  Sorry you are so sick--keep up with fluids! 2)  Return to clinic on Friday if still having diarrhea or decreased urine output. 3)  For diarrhea, use:  Loperamide 2 mg tablets, take 2 tablets immediately, then 1 tablet every time you have a loose stool.  No more than 8 tablets per day.

## 2010-11-19 NOTE — Miscellaneous (Signed)
Summary: Device preload  Clinical Lists Changes  Observations: Added new observation of PPM INDICATN: CHB (07/06/2010 13:11) Added new observation of MAGNET RTE: BOL 85 ERI 65 (07/06/2010 13:11) Added new observation of PPMLEADSTAT2: active (07/06/2010 13:11) Added new observation of PPMLEADSER2: (07/06/2010 13:11) Added new observation of PPMLEADMOD2: 438-01 (07/06/2010 13:11) Added new observation of PPMLEADDOI2: 08/05/1996 (07/06/2010 13:11) Added new observation of PPMLEADLOC2: RV (07/06/2010 13:11) Added new observation of PPMLEADSTAT1: active (07/06/2010 13:11) Added new observation of PPMLEADSER1: (07/06/2010 13:11) Added new observation of PPMLEADMOD1: 438-01 (07/06/2010 13:11) Added new observation of PPMLEADDOI1: 08/05/1996 (07/06/2010 13:11) Added new observation of PPMLEADLOC1: RA (07/06/2010 13:11) Added new observation of PPM IMP MD: Roger Shelter, MD (07/06/2010 13:11) Added new observation of PPM DOI: 06/21/2003 (07/06/2010 13:11) Added new observation of PPM SERL#: [id number] (07/06/2010 13:11) Added new observation of PPM MODL#: 303B (07/06/2010 13:11) Added new observation of PACEMAKERMFG: [employer]onic (07/06/2010 13:11) Added new observation of PPM REFER MD: Roger Shelter, MD (07/06/2010 13:11) Added new observation of PACEMAKER MD: Lewayne Bunting, MD (07/06/2010 13:11)      PPM Specifications Following MD:  Lewayne Bunting, MD     Referring MD:  Roger Shelter, MD PPM Vendor:  [employer]onic     PPM Model Number:  [id number]     PPM Serial Number:  [id number] PPM DOI:  06/21/2003     PPM Implanting MD:  Roger Shelter, MD  Lead 1    Location: RA     DOI: 08/05/1996     Model #: 438-01     Serial #:     Status: active Lead 2    Location: RV     DOI: 08/05/1996     Model #: 438-01     Serial #:     Status: active  Magnet Response Rate:  BOL 85 ERI 65  Indications:  CHB

## 2010-11-19 NOTE — Assessment & Plan Note (Signed)
Summary: cramps & incontinence-OB pt/[place]   Vital Signs:  Patient profile:   30 year old female Height:      65.75 inches Weight:      219 pounds Temp:     98.4 degrees F oral BP sitting:   117 / 74  (right arm) Cuff size:   large  Vitals Entered By: Tessie Fass CMA (May 21, 2010 9:23 AM) CC: cramping and incontinence Is Patient Diabetic? No   Primary Care Provider:  Romero Belling MD  CC:  cramping and incontinence.  History of Present Illness: Makayla Vasquez is a G1P0 at 25 4/[redacted] weeks EGA who presents today with complaint of upper abdominal/epigastric cramping, which began @ 2 days ago.  Denies vaginal discharge or bleeding, denies fevers or chills, denies dysuria but has had urinary urgency with little urine production.  Was seen @[facility]  of [facility] for vaginal discharge on July 27th, had workup which included cervical cultures (negative for GC and Chlamydia), wet prep (positive clue cells), UA that was not consistent wiht infection; prior UCx in June 2011 with multiple morphotypes.  Treated with oral flagyl for BV from MAU, started the Flagyl about 2 days ago (at about the time of presentation of today's chief complaint).  She had [place] at MAU which confirmed live IUP with fetal heart activity.   No other new meds; no contractions by history.    Habits & Providers  Alcohol-Tobacco-Diet     Cigarette Packs/Day: n/a  Allergies: 1)  Zoloft (Sertraline Hcl)  Physical Exam  General:  well appearing, no apparent distress Mouth:  moist mucus membranes Neck:  neck supple.  Lungs:  Normal respiratory effort, chest expands symmetrically. Lungs are clear to auscultation, no crackles or wheezes. Heart:  Normal rate and regular rhythm. S1 and S2 normal without gallop, murmur, click, rub or other extra sounds. Abdomen:  soft, nontender.  Genitalia:  Normal vaginal mucosa without lesions.  No adnexal tenderness.  No CMT no masses. Gravid uterus.    Impression & Recommendations:  Problem #  1:  POLYURIA (WUJ-811.91) Polyuria and upper abdominal cramping sensation for the past 2 days, which corresponds with when she started metronidazole oral for BV, diagnosed at MAU on July 27th.  I reviewed the [facility] lab results from that visit, which included negative cervical cultures and a wet prep that was positive for clue cells.  To change to oral flagyl 5g per vagina at bedtime, for 5 days. Keep previously scheduled OB appt with Dr Alvester Morin.  Urine sent for culture; although I suspect that the polyuria is related to normal changes of pregnancy.  [place] at bedside visualized fetal heart movement and limb movement, viewed by mother and FOB.  Orders: Urinalysis-[facility] (00000) Urine Culture-[facility] ([id number]) Medicaid OB visit - [facility] ([id number])  Problem # 2:  BACTERIAL VAGINITIS (ICD-616.10)  Abdominal pain with oral metronidazole for BV diagnosed at [facility] on July 27th.  Will change to topical for 5 days.  Follow up with Dr Alvester Morin.   Orders: Medicaid OB visit - [facility] (84696) Other OB visit- [facility] (OBCK)  Her updated medication list for this problem includes:    Metrogel-vaginal 0.75 % Gel (Metronidazole) ..... Sig: apply 5g per vagina at bedtime, for 5 days disp quant sufficient 5 days  Complete Medication List: 1)  [facility]vs Prenatal 28-0.8 Mg Tabs (Prenatal vit-fe fumarate-fa) .[name].. 1 by mouth daily (may substitute) 2)  Ventolin Hfa 108 (90 Base) Mcg/act Aers (Albuterol sulfate) .... 2 puffs every 4 hours as needed wheezing 3)  Polyethylene Glycol 3350 Powd (Polyethylene glycol 3350) .Marland KitchenMarland KitchenMarland Kitchen 17 grams in 8 ounces of water two times a day until bowel movement, then once daily.  disp qs x1 month. 4)  Metrogel-vaginal 0.75 % Gel (Metronidazole) .... Sig: apply 5g per vagina at bedtime, for 5 days disp quant sufficient 5 days  Patient Instructions: 1)  It was a pleasure to see you today.  I believe your abdominal cramping is related to the oral metronidazole which you began taking for the bacterial vaginosis.  2)  I am  changing you from the oral form to a vaginal gel form. Please apply vaginally, at bedtime, for 5 nights.  Stop taking the oral metronidazole.  (I [facility] [address], [place]). 3)  Please keep your appointment with Dr Alvester Morin as scheduled.  Call us back if you develop worsening abdominal pain, fevers or chills, vomiting or diarrhea, or if you have any other questions or concerns.  Prescriptions: METROGEL-VAGINAL 0.75 % GEL (METRONIDAZOLE) SIG: Apply 5g per vagina at bedtime, for 5 days DISP Quant sufficient 5 days  #1 x 0   Entered and Authorized by:   Paula Compton MD   Signed by:   Paula Compton MD on 05/21/2010   Method used:   [facility]  [address] [id number]       [address]       [place], [place]  [address]       Ph: [phone] or [phone]       Fax: [phone]   RxID:   [id number]   Laboratory Results   Urine Tests  Date/Time Received: May 21, 2010 10:15 AM  Date/Time Reported: May 21, 2010 10:32 AM   Routine Urinalysis   Color: yellow Appearance: Clear Glucose: negative   (Normal Range: Negative) Bilirubin: negative   (Normal Range: Negative) Ketone: trace (5)   (Normal Range: Negative) Spec. Gravity: >=1.030   (Normal Range: 1.003-1.035) Blood: negative   (Normal Range: Negative) pH: 5.5   (Normal Range: 5.0-8.0) Protein: 30   (Normal Range: Negative) Urobilinogen: 0.2   (Normal Range: 0-1) Nitrite: negative   (Normal Range: Negative) Leukocyte Esterace: trace   (Normal Range: Negative)  Urine Microscopic WBC/HPF: TNTC RBC/HPF: 1-5 Bacteria/HPF: 2+ Mucous/HPF: 3+ Epithelial/HPF: 15-25    Comments: urine sent for culture ...............test performed by......Marland KitchenBonnie A. [place], MLS (ASCP)cm       Flowsheet View for Follow-up Visit    Estimated weeks of       gestation:     12 4/7    Weight:     219    Blood pressure:   117 / 74    Urine protein:        30    Urine glucose:    negative    Urine nitrite:     negative    Hx headache?     No    Nausea/vomiting?   nausea    Edema?     0    Bleeding?     no    Leakage/discharge?   no    Labor symptoms?   no    FHR:       present    Smoking PPD:   n/a    Comment:     see CPOE    Next visit:     2 wk    Preceptor:  Mauricio Po

## 2010-11-19 NOTE — Miscellaneous (Signed)
Summary: Veramyst not covered, switch to Fluticasone  Veramyst not covered, switch to Fluticasone  Medications: Changed medication from VERAMYST 27.5 MCG/SPRAY SUSP (FLUTICASONE FUROATE) 2 sprays per nostril daily to FLUTICASONE PROPIONATE 50 MCG/ACT SUSP (FLUTICASONE PROPIONATE) 1 spray each nostril daily for allergies - Signed Rx of FLUTICASONE PROPIONATE 50 MCG/ACT SUSP (FLUTICASONE PROPIONATE) 1 spray each nostril daily for allergies;  #1 x 2;  Signed;  Entered by: Romero Belling MD;  Authorized by: Romero Belling MD;  Method used: [facility]  [address] [id number] [address] [place], [place], [place]  [address], Ph: [phone] or [phone], Fax: [phone]    Prescriptions: FLUTICASONE PROPIONATE 50 MCG/ACT SUSP (FLUTICASONE PROPIONATE) 1 spray each nostril daily for allergies  #1 x 2   Entered and Authorized by:   Romero Belling MD   Signed by:   Romero Belling MD on 04/03/2010   Method used:   [facility]ically [facility]        [facility]  [address] [id number] ([facility])       [address]       [place], [place]  [address]       Ph: [phone] or [phone]       Fax: [phone]   RxID:   [id number]

## 2010-11-19 NOTE — Progress Notes (Signed)
Summary: refill  Phone Note Call from Patient Call back at [phone]   Caller: Patient Summary of Call: has been off of meds for Metformin and needs to get back on it.  also needs to be back on her cholesterol meds also. [facility] [address] Initial call taken by: De Nurse,  November 06, 2009 10:42 AM  Follow-up for Phone Call        to pcp. these drugs are not on current list Follow-up by: Golden Circle RN,  November 06, 2009 10:45 AM  Additional Follow-up for Phone Call Additional follow up Details #1::        I have reviewed chart.  No record of Metformin or cholesterol meds now or in past.  Please have patient schedule an appointment.  To triage nurse. Additional Follow-up by: Romero Belling MD,  November 06, 2009 1:04 PM    Additional Follow-up for Phone Call Additional follow up Details #2::    offered her several appts this week. they all conflicted with work or other appts. will see pcp 1/26 Follow-up by: Golden Circle RN,  November 06, 2009 2:12 PM

## 2010-11-19 NOTE — Letter (Signed)
Summary: Generic Letter  [facility]  [address]   [place], [place] [address]   Phone: [phone]  Fax: [phone]    05/27/2010  Makayla Vasquez [address] [place], [place]  [address]  Dear [name],   I hope this letter finds you well.  I write to follow up on your visit last week, and to see how you are feeling.  I tried to call you at your home number [phone]) but the number was not working.  The work number on file [phone]) does not accept personal calls.   Please call us to update your contact information, as well as to let us know how you are feeling.  Also, I ask that you please keep your scheduled appointment with Dr. Alvester Morin in the [facility]     Sincerely,   Paula Compton MD  Appended Document: Generic Letter mailed

## 2010-11-19 NOTE — Letter (Signed)
Summary: Out of Work  [facility]  [address]   [place], [place] [address]   Phone: [phone]  Fax: [phone]    November 22, 2009   Employee:  [name]    To Whom It May Concern:   For Medical reasons, please excuse the above named employee from work for the following dates:  Start:   11-22-09  End:   11-26-09  If you need additional information, please feel free to contact our office.         Sincerely,    Jamie Brookes MD

## 2010-11-19 NOTE — Progress Notes (Signed)
Summary: Notes Needed/records faxed/ts  Phone Note From Other Clinic   Caller: [facility] Summary of Call: Needs records concerning this pregnancy.  Fax number [phone] Initial call taken by: Clydell Hakim,  May 10, 2010 12:18 PM  Follow-up for Phone Call        faxed records to [facility]. Follow-up by: Arlyss Repress CMA,,  May 10, 2010 4:06 PM

## 2010-11-19 NOTE — Assessment & Plan Note (Signed)
Summary: NOB   Vital Signs:  Patient profile:   30 year old female Height:      65.75 inches Weight:      217.4 pounds BMI:     35.48 Temp:     98.3 degrees F oral Pulse rate:   77 / minute BP sitting:   127 / 84  (left arm) Cuff size:   regular  Vitals Entered By: Gladstone Pih (April 08, 2010 8:35 AM) CC: F/U from women's ER visit/ positive preq test Is Patient Diabetic? No Pain Assessment Patient in pain? no      LMP (date): 11/04/2007 EDC 11/29/2010 LMP - Reliable? No Menarche (age onset years): 12   Menses interval (days): irreg Menstrual flow (days): 7 On BCP's at conception: no Last PAP Result NEGATIVE FOR INTRAEPITHELIAL LESIONS OR MALIGNANCY.   CC:  F/U from women's ER visit/ positive preq test.  Habits & Providers  Alcohol-Tobacco-Diet     Tobacco Status: never     Cigarette Packs/Day: n/a  Current Medications (verified): 1)  Cvs Prenatal 28-0.8 Mg  Tabs (Prenatal Vit-Fe Fumarate-Fa) .[name].. 1 By Mouth Daily (May Substitute) 2)  Ventolin Hfa 108 (90 Base) Mcg/act Aers (Albuterol Sulfate) .... 2 Puffs Every 4 Hours As Needed Wheezing 3)  Polyethylene Glycol 3350  Powd (Polyethylene Glycol 3350) .Marland KitchenMarland KitchenMarland Kitchen 17 Grams in 8 Ounces of Water Two Times A Day Until Bowel Movement, Then Once Daily.  Disp Qs X1 Month.  Allergies (verified): 1)  Zoloft (Sertraline Hcl)  Past History:  Past Surgical History: End-of-life pulse generator (?defib), pacemaker- 06/23/2003 RUQ U/S - cholelithiasis - 03/01/2004  cholecystectomy Had cyst lasered off throat nasal plate due to broken nose  Family History: 1 brother - asthma 3 sisters - one with congenital heart dz. died 2023-07-25 from heart disease age 71, one with DM Father - gout, HTN, asthma, anxiety Mother - Lupus, migraines, h/o MI (16s), `heart dz`, HTN, bipolar disoder grandfather with colon cancer, otherwise none niece with ADHD  Social History: No ETOH, no smoking, no drugs.  Currently unemployed.  Lives with boyfriend.  Has  pekinese and pitt bull at home.  BF smokes outsideSmoking Status:  never Education:  12th grade Hepatitis Risk:  no Packs/Day:  n/a  Review of Systems       See HPI/flowsheet  Physical Exam  General:  Obese,,in no acute distress; alert,appropriate and cooperative throughout examination Head:  Normocephalic and atraumatic without obvious abnormalities. No apparent alopecia or balding. Mouth:  Oral mucosa and oropharynx without lesions or exudates.  Teeth in good repair. Neck:  No deformities, masses, or tenderness noted.  No Thyromegaly Chest Wall:  2 well healed scars ant chest wall from pacemaker placement Lungs:  Normal respiratory effort, chest expands symmetrically. Lungs are clear to auscultation, no crackles or wheezes. Heart:  Normal rate and regular rhythm. S1 and S2 normal without gallop, murmur, click, rub or other extra sounds. Abdomen:  ,abdomen soft and non-tender without masses, organomegaly or hernias noted. Decreased bowel sounds.  Uterus not palpable on abd exam.  Well healed scars present from cholecystecomy. Genitalia:  Deferred.  Pap done 12/10, had GU exam with STI testing on 04/04/10 Skin:  + acanthosis nigricans at neck Psych:  Cognition and judgment appear intact. Alert and cooperative with normal attention span and concentration. No apparent delusions, illusions, hallucinations.  Normal grooming and dress   Impression & Recommendations:  Problem # 1:  SUPERVISION OF NORMAL FIRST PREGNANCY (ICD-V22.0) Assessment Deteriorated First visit with [place] for this.  Check prenatal labs today.  Given return precautions.  Follow up 4 weeks with [place]. Check glucola today.  Risk factors included +FH, ethnic background, obesity, PCOS.   Pt desired integrated screen.  Schedule in about 5 weeks. Had viral illness and chest x-ray around 5 weeks.  Will discuss with OB to see if high risk consult needed. FOP reports Sickle Cell trait pos.  Await prenatal labs for screen.  If she is  trait pos, offer genetics counseling. Reviewed treatments for nausea (B6, doxlyalmine, small frequent meals) Pt to call if uncontrolled with these measures and we can prescribe ondansetron. Reviewed weight gain and avoiding concentrated sweets as pt at high risk for diabetes.  Orders: Prenatal-[facility] ([address]) Glucose 1 hr-[facility] (27253) Urine Culture-[facility] ([id number]) HIV-[facility] (59563-87564) Sickle Cell Scr-[facility] (33295-18841) Prenatal U/S < 14 weeks - 66063  (Prenatal U/S)  Problem # 2:  RH FACTOR, NEGATIVE (ICD-656.10) Reviewed bleeding precautions  Problem # 3:  BIPOLAR DISORDER (ICD-296.7) Doing well off meds.  Abilify stopped prior to conception.  Reviewed importance of close follow up. Pt to schedule appt in mood disorders clinic for consult to discuss which meds are possible if they are needed in pregnancy.  Problem # 4:  HEART BLOCK (ICD-426.9) PT with pacemaker in place.  Will discuss with OBs to determine if high risk consult needed.  Problem # 5:  ASTHMA (ICD-493.90) Well controlled.  If pt using inhaler more than twice daily, will notify us and we can consider controller meds Her updated medication list for this problem includes:    Ventolin Hfa 108 (90 Base) Mcg/act Aers (Albuterol sulfate) .[name]... 2 puffs every 4 hours as needed wheezing  Problem # 6:  OBESITY, NOS (ICD-278.00) Reviewed weight gain and avoiding concentrated  Orders: Prenatal-[facility] ([address]) Glucose 1 hr-[facility] (35573)  Complete Medication List: 1)  Cvs Prenatal 28-0.8 Mg Tabs (Prenatal vit-fe fumarate-fa) .[name].. 1 by mouth daily (may substitute) 2)  Ventolin Hfa 108 (90 Base) Mcg/act Aers (Albuterol sulfate) .... 2 puffs every 4 hours as needed wheezing 3)  Polyethylene Glycol 3350 Powd (Polyethylene glycol 3350) .Marland KitchenMarland KitchenMarland Kitchen 17 grams in 8 ounces of water two times a day until bowel movement, then once daily.  disp qs x1 month.  Patient Instructions: 1)  Congratulations on your pregnancy! 2)  We will schedule your  next ultrasound in about 5 weeks.  That will also give [place] some information about genetic screening. 3)  For the nausea, you can try Vitamin B6 25 mg up to 4 times a day and also Doxylamine (Unisom) 12.5 mg two times a day. This will make you sleepy. 4)  I will talk with the obstetricians to see if they would like to have a consultation with you since you have a pacemaker. 5)  Please schedule the appt with our mood disorders clinic, and let us know right away if you have any concerns about feeling down.   6)  Your blood type is O negative, so if you have any bleeding, please let us know right away. 7)  We will schedlue a routine OB visit with [place] in 4 weeks.    Prenatal Visit    FOB name: Weston Settle Concerns noted: Pt here to f/u from being seen in MAU on 04/04/10 for abd pain.  Still with some pain.  No bleeding.  No discharge. Pt feels BPAD is under control.  Not on meds.   EDC Confirmation:    New working [facility]: 11/29/2010    LMP reliable? No Ultrasound Dating  Information:    First U/S on 04/04/2010   Gest age: 29 and 6/7   EDC: 11/29/2010.   OB Initial Intake Information    Positive HCG by: [facility]    Race: Black    Marital status: Single    Occupation: homemaker    Education (last grade completed): 12th grade    Number of children at home: 0    [facility]    Newborn's physician: [facility]  FOB Information    Husband/Father of baby: Weston Settle    FOB occupation Surveyor, mining at [employer]    Phone: [phone]  Menstrual History    LMP (date): 11/04/2007    Best Working EDC: 11/29/2010    LMP - Reliable? : No    Menarche: 12 years    Menses interval: irreg days    Menstrual flow 7 days    On BCP's at conception: no    Pre Pregnancy Weight: 217 lbs.    Symptoms since LMP: amenorrhea, nausea, fatigue, irritability, bloating, tender breasts, urinary frequency    Other symptoms: abd pain   Prenatal Visit    FOB name: Weston Settle [facility]  Confirmation:    New working [facility]: 11/29/2010    LMP reliable? No   Past Pregnancy History    Gravida:     1   Genetic History    Father of baby:   Weston Settle     Thalassemia:     mother: no   father: no    Neural tube defect:   mother: no   father: no    Down's Syndrome:   mother: yes   father: no   comments: Cousin (aunt's granddaughter)    Tay-Sachs:     mother: no   father: no    Sickle Cell Dz/Trait:   mother: no   father: yes   comments: FOB has trait    Hemophilia:     mother: no   father: no    Muscular Dystrophy:   mother: no   father: no    Cystic Fibrosis:   mother: no   father: no    Huntington's Dz:   mother: no   father: no    Mental Retardation:   mother: no   father: no    Fragile X:     mother: no   father: no    Other Genetic or       Chromosomal Dz:   mother: no   father: no    Child with other       birth defect:     mother: no   father: no    > 3 spont. abortions:   mother: no    Hx of stillbirth:     mother: no  Additional Genetic Comments:    Pt interested in integrated screen  Infection Risk History    Infection Risk History Reviewed:    High Risk Hepatitis B: no    Immunized against Hepatitis B: yes    Exposure to TB: no    Patient with history of Genital Herpes: no    Sexual partner with history of Genital Herpes: no    History of STD (GC, Chlamydia, Syphilis, HPV): yes    Specific STD: trich    Rash, Viral, or Febrile Illness since LMP: yes    Exposure to Cat Litter: no    Chicken Pox Immune Status: Hx of Disease: Immune    History of Parvovirus (Fifth Disease): no    Occupational  Exposure to Children: none  Environmental Exposures    Environmental Exposures Reviewed:    Xray Exposure since LMP: yes    Chemical or other exposure: no  Additional Infection/Environmental Comments:    Seen in ED for temp to 102.3.  No symptoms other than sinus problems.  Chest x-ray on 04/03/10. Treated for trich with metronidazole    Flowsheet  View for Follow-up Visit    Estimated weeks of       gestation:     6 3/7    Weight:     217.4    Blood pressure:   127 / 84    Headache:     +    Nausea/vomiting:   nausea    Edema:     0    Vaginal bleeding:   no    Vaginal discharge:   no    Taking prenatal vits?   Y    Smoking:     n/a    Next visit:     4 wk   Appended Document: NOB    Clinical Lists Changes  Orders: Added new Test order of Medicaid OB visit - [facility] [phone]) - Signed

## 2010-11-19 NOTE — Miscellaneous (Signed)
Summary: diarrhea.   Clinical Lists Changes works at [employer]. they have norovirus going around. she has diarrhea. started last night. advised immodium & lots of fluids to replace what she has lost. she agreed with plan.Golden Circle RN  December 18, 2009 3:16 PM

## 2010-11-19 NOTE — Assessment & Plan Note (Signed)
Summary: OB Visit: 14 6/7 weeks   Vital Signs:  Patient profile:   30 year old female Height:      65.75 inches Weight:      221 pounds BMI:     36.07 Temp:     98.5 degrees F oral Pulse rate:   94 / minute BP sitting:   118 / 80  (left arm) Cuff size:   large  Vitals Entered By: Tessie Fass CMA (June 06, 2010 1:34 PM) CC: F/U Is Patient Diabetic? No Pain Assessment Patient in pain? no        CC:  F/U.  Habits & Providers  Alcohol-Tobacco-Diet     Tobacco Status: quit  Allergies: 1)  Zoloft (Sertraline Hcl)  Social History: Smoking Status:  quit  Physical Exam  General:  alert and overweight-appearing.   Head:  normocephalic and atraumatic.   Eyes:  vision grossly intact (bifocals in place) Ears:  R ear normal and L ear normal.   Nose:  no external deformity.   Mouth:  good dentition.   Neck:  large neck girth, full ROM Lungs:  CTAB, no whezes, rales, rhoncii Heart:  RRR, no rubs, gallops, murmurs.  L upper chest scar stable. Abdomen:  gravid abdomen. Baseline obese abdomen.  Extremities:  2+ peripheral pulses, no edema   Impression & Recommendations:  Problem # 1:  PREGNANCY, PRIMIGRAVIDA (ICD-V22.0) Otherwise appropriate fetal development to date. Weight appropriate in setting of pregancy. Stabel home environment and good interactions w/ FOB. Nuchal translucency @12  6/7 weeks WNL.  Plan to setup pt for anatomy u/s 18-20. Will also send to high risk OB for evaluation given multiple medical problems, including 3rd degree heart block, PCOS, and bipolar d/o.  Orders: Prenatal U/S > 14 weeks - 16109 (Prenatal U/S) [facility]- Est Level  3 ([address])  Problem # 2:  HEART BLOCK (ICD-426.9) No CP, SOB, heart palpitations per pt. Tentatively scheduled for cardiology followup in 4-6 weeks. Red flags reviewed w/ pt.  Reinforced need for medical records for cardiologist be sent to [facility] Pt agreeable.   Problem # 3:  BIPOLAR DISORDER (ICD-296.7) No depressive or manic  type sxs per pt. Has not had psych followup/evaluation in 1-11/2 years per pt. Reinforced need for [facility] appt to assess psych status while pregnant. Pt agreeable to appt set up.   Problem # 4:  URINARY TRACT INFECTION, GESTATIONAL (ICD-599.0) No dysuria per pt. Plan to treat w/ macrobid in setting of multiple RFs.  Her updated medication list for this problem includes:    Macrobid 100 Mg Caps (Nitrofurantoin monohyd macro) .[name]... Take 1 tab twice daily for 7 days  Complete Medication List: 1)  Cvs Prenatal 28-0.8 Mg Tabs (Prenatal vit-fe fumarate-fa) .[name].. 1 by mouth daily (may substitute) 2)  Ventolin Hfa 108 (90 Base) Mcg/act Aers (Albuterol sulfate) .... 2 puffs every 4 hours as needed wheezing 3)  Polyethylene Glycol 3350 Powd (Polyethylene glycol 3350) .Marland KitchenMarland KitchenMarland Kitchen 17 grams in 8 ounces of water two times a day until bowel movement, then once daily.  disp qs x1 month. 4)  Metrogel-vaginal 0.75 % Gel (Metronidazole) .... Sig: apply 5g per vagina at bedtime, for 5 days disp quant sufficient 5 days 5)  Macrobid 100 Mg Caps (Nitrofurantoin monohyd macro) .... Take 1 tab twice daily for 7 days  Other Orders: Obstetric Referral (Obstetric)  Patient Instructions: 1)  It was good seeing you today 2)  For your reflux, you can take tums as needed 3)  Dont forget to setup  your appointment in mood disorder clinic 4)  We will set you up for your anatomy ultrasound 5)  We will treat you for a urinary tract infection  6)  Please have your cardiologist forward any new medical records 7)  Please have your psychiatrist forward any new medical records 8)  If you have any worseing of your palpitations that are greater than normal, or have severe shortness of breath please give [place] a call or go to the emergency room 9)  Otherwise followup w/ me in 4 weeks 10)  God Bless,  11)  Doree Albee, MD Prescriptions: MACROBID 100 MG CAPS (NITROFURANTOIN MONOHYD MACRO) take 1 tab twice daily for 7 days  #14 x 0   Entered  and Authorized by:   Doree Albee MD   Signed by:   Doree Albee MD on 06/09/2010   Method used:   [facility]        [facility] [address] [phone]* (retail)       [address]       [place], [place]  [address]       Ph: [phone] or [phone]       Fax: [phone]   RxID:   [id number]    Flowsheet View for Follow-up Visit    Estimated weeks of       gestation:     14 6/7    Weight:     221    Blood pressure:   118 / 80    Nausea/vomiting?   No    Edema?     0    Bleeding?     no    Leakage/discharge?   no    Labor symptoms?   no    FHR:       140s    Next visit:     4 wk    Resident:     Alvester Morin     Preceptor:     Mauricio Po

## 2010-11-19 NOTE — Progress Notes (Signed)
Summary: triage  Phone Note Call from Patient Call back at Home Phone [phone]   Caller: Patient Summary of Call: Pt out of work yesterday and today and needs to be seen because she has a stomach virus.   Initial call taken by: Clydell Hakim,  December 19, 2009 12:10 PM  Follow-up for Phone Call        she will be here at 1:30 to see her pcp. she emphasized she needs a note for work. told her unlikely we will be able to write her out yesterday since we did not see her. to discuss with pcp Follow-up by: Golden Circle RN,  December 19, 2009 12:16 PM

## 2010-11-19 NOTE — Assessment & Plan Note (Signed)
Summary: med review/[place]   Vital Signs:  Patient profile:   30 year old female Height:      65.9 inches Weight:      217.2 pounds BMI:     35.29 Temp:     98.1 degrees F oral Pulse rate:   74 / minute BP sitting:   120 / 77  (left arm) Cuff size:   regular  Vitals Entered By: Garen Grams LPN (November 14, 2009 11:44 AM) CC: wants to restart meds Is Patient Diabetic? No Pain Assessment Patient in pain? no        Primary Care Provider:  Romero Belling MD  CC:  wants to restart meds.  History of Present Illness: PCOS.  Formerly on Metformin several years ago (once daily).  Trying to lose weight.  Wants to have children.  Periods q57month.  Had hysterosalpingogram at [facility] that indicated scarred fallopian tubes.  Has not been to fertility specialist.  No h/o diabetes or abnormal blood sugar.  LIPIDS.  States she just had lipids checked at cardiologist ([facility]), and would like to start Lipitor.  Does not have records with her.  Habits & Providers  Alcohol-Tobacco-Diet     Tobacco Status: never  Current Medications (verified): 1)  Abilify 15 Mg Tabs (Aripiprazole) .[name].. 1 Tab By Mouth Daily For Bipolar Disorder 2)  Veramyst 27.5 Mcg/spray Susp (Fluticasone Furoate) .... 2 Sprays Per Nostril Daily 3)  Cvs Prenatal 28-0.8 Mg  Tabs (Prenatal Vit-Fe Fumarate-Fa) .[name].. 1 By Mouth Daily (May Substitute) 4)  Cetirizine Hcl 10 Mg Tabs (Cetirizine Hcl) .... One At Bedtime 5)  Ventolin Hfa 108 (90 Base) Mcg/act Aers (Albuterol Sulfate) .... 2 Puffs Every 4 Hours As Needed Wheezing 6)  Polyethylene Glycol 3350  Powd (Polyethylene Glycol 3350) .Marland KitchenMarland KitchenMarland Kitchen 17 Grams in 8 Ounces of Water Two Times A Day Until Bowel Movement, Then Once Daily.  Disp Qs X1 Month. 7)  Ranitidine Hcl 150 Mg Tabs (Ranitidine Hcl) .[name].. 1 Tab By Mouth Two Times A Day For Reflux 8)  Metformin Hcl 500 Mg Tabs (Metformin Hcl) .[name].. 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  Patient is trying to lose weight and would like to become pregnant.  Encouraged weight loss.  Advised to see fertility specialist as she reports scarred fallopian tubes in addition to PCOS. Orders: [facility]- Est Level  3 ([address])  Problem # 2:  Preventive Health Care (ICD-V70.0) Assessment: Comment Only Have requested records from her cardiologist, who she says just drew lipid panel.  Complete Medication List: 1)  Abilify 15 Mg Tabs (Aripiprazole) .[name].. 1 tab by mouth daily for bipolar disorder 2)  Veramyst 27.5 Mcg/spray Susp (Fluticasone furoate) .... 2 sprays per nostril daily 3)  Cvs Prenatal 28-0.8 Mg Tabs (Prenatal vit-fe fumarate-fa) .[name].. 1 by mouth daily (may substitute) 4)  Cetirizine Hcl 10 Mg Tabs (Cetirizine hcl) .... One at bedtime 5)  Ventolin Hfa 108 (90 Base) Mcg/act Aers (Albuterol sulfate) .... 2 puffs every 4 hours as needed wheezing 6)  Polyethylene Glycol 3350 Powd (Polyethylene glycol 3350) .Marland KitchenMarland KitchenMarland Kitchen 17 grams in 8 ounces of water two times a day until bowel movement, then once daily.  disp qs x1 month. 7)  Ranitidine Hcl 150 Mg  Tabs (Ranitidine hcl) .[name].. 1 tab by mouth two times a day for reflux 8)  Metformin Hcl 500 Mg Tabs (Metformin hcl) .[name].. 1 tab by mouth daily for pcos  Patient Instructions: 1)  Metformin [facility] [address], take once daily. 2)  Please schedule a follow-up appointment in 3 months .  Prescriptions: METFORMIN HCL 500 MG TABS (METFORMIN HCL) 1 tab by mouth daily for PCOS  #30 x 3   Entered and Authorized by:   Romero Belling MD   Signed by:   Romero Belling MD on 11/14/2009   Method used:   [facility]        [facility]  [address] [id number]       [address]       [place], [place]  [address]       Ph: [phone] or [phone]       Fax: [phone]   RxID:   [id number]

## 2010-11-19 NOTE — Assessment & Plan Note (Signed)
Summary: knot/sore on neck,df   Vital Signs:  Patient profile:   30 year old female Weight:      224.8 pounds Temp:     98.2 degrees F oral Pulse rate:   85 / minute Pulse rhythm:   regular BP sitting:   112 / 74  (left arm) Cuff size:   large  Vitals Entered By: Loralee Pacas CMA (July 18, 2010 10:32 AM) CC: knot on throat x 3 weeks   Primary Care Provider:  Doree Albee MD  CC:  knot on throat x 3 weeks.  History of Present Illness: Abscess draining on midline of anterior neck.  Has been there for two weeks. Is recurrent. Mildly tender.  No surrounding erythemia or pain.  No trouble swallowing or sore throat. Pt is annoyed by the draining cyst.  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) 2)  Urinary Tract Infection, Gestational  (ICD-599.0) 3)  Bacterial Vaginitis  (ICD-616.10) 4)  Pregnancy, Primigravida  (ICD-V22.0) 5)  Unspecified Cardiac Dysrhythmia  (ICD-427.9) 6)  Unspec Disorder Carbohydrate Transport&metab  (ICD-271.9) 7)  Rh Factor, Negative  (ICD-656.10) 8)  Supervision of Normal First Pregnancy  (ICD-V22.0) 9)  Pacemaker, Permanent  (ICD-V45.01) 10)  Bipolar Disorder  (ICD-296.7) 11)  Heart Block  (ICD-426.9) 12)  Obesity, Nos  (ICD-278.00) 13)  Asthma  (ICD-493.90) 14)  Chest Pain, Hx of  (ICD-V12.50) 15)  Contraceptive Management  (ICD-V25.09) 16)  Paresthesia  (ICD-782.0) 17)  Allergic Rhinitis  (ICD-477.9) 18)  Esophageal Reflux  (ICD-530.81) 19)  Irritable Bowel Syndrome  (ICD-564.1) 20)  Polycystic Ovary  (ICD-256.4)  Current Medications (verified): 1)  Cvs Prenatal 28-0.8 Mg  Tabs (Prenatal Vit-Fe Fumarate-Fa) .[name].. 1 By Mouth Daily (May Substitute) 2)  Ventolin Hfa 108 (90 Base) Mcg/act  Aers (Albuterol Sulfate) .... 2 Puffs Every 4 Hours As Needed Wheezing 3)  Polyethylene Glycol 3350  Powd (Polyethylene Glycol 3350) .Marland KitchenMarland KitchenMarland Kitchen 17 Grams in 8 Ounces of Water Two Times A Day Until Bowel Movement, Then Once Daily.  Disp Qs X1 Month. 4)  Metrogel-Vaginal 0.75 % Gel (Metronidazole) .... Sig: Apply 5g Per Vagina At Bedtime, For 5 Days Disp Quant Sufficient 5 Days 5)  Keflex 500 Mg Caps (Cephalexin) .[name].. 1 By Mouth Two Times A Day X 7days  Allergies (verified): 1)  Zoloft (Sertraline Hcl)  Past History:  Past Medical History: Last updated: 09/20/2008 Born with AV heart block, S/p pacemaker Gyn:  - PCOS (has been on statin, metformin, and Bblocker in the past) - infertility - has h/o R fallopian scarring, currently working with infertility specialist to become pregnant trichomonas (02/2005), (08/2005) Bipolar;  Disability for some type of mental health thing "can't understand things"  Social History: Last updated: 04/08/2010 No ETOH, no smoking, no drugs.  Currently unemployed.  Lives with boyfriend.  Has pekinese and pitt bull at home.  BF smokes outside  Review of Systems  The patient denies anorexia, fever, weight loss, hoarseness, and chest pain.    Physical Exam  General:  VS ntoed.  Well NAD Nose:  no external deformity.   Mouth:  good dentition.   Neck:  large neck girth, full ROM. See Skin section  Skin:  1cm by 0.25 cm pink tissue midline in anterior neck just superior to the thryoid cartliage.  Tender to palpation. Small amount of purlent foul smelling exudative material expressed.  No surrounding induration. No fluctuance noted.  Cervical Nodes:  No lymphadenopathy noted   Impression & Recommendations:  Problem # 1:  THYROGLOSSAL DUCT CYST (ICD-759.2) Think Thyroglossal duct cyst due to location and history of recurracne. However could be other skin abscess.  Plan to reduce inflimation of keflex (avoid Doxy, or Bactrim due to pregnancy).  Will also send to ENT  for further workup and management.  Definative treatment when baby delivered in Feb.   Will follow up as needed.  Red flags given.  Orders: Culture, Wound -[facility] ([address]) ENT Referral (ENT) [facility]- Est Level  3 ([address])  Complete Medication List: 1)  Cvs Prenatal 28-0.8 Mg Tabs (Prenatal vit-fe fumarate-fa) .[name].. 1 by mouth daily (may substitute) 2)  Ventolin Hfa 108 (90 Base) Mcg/act Aers (Albuterol sulfate) .... 2 puffs every 4 hours as needed wheezing 3)  Polyethylene Glycol 3350 Powd (Polyethylene glycol 3350) .Marland KitchenMarland KitchenMarland Kitchen 17 grams in 8 ounces of water two times a day until bowel movement, then once daily.  disp qs x1 month. 4)  Metrogel-vaginal 0.75 % Gel (Metronidazole) .... Sig: apply 5g per vagina at bedtime, for 5 days disp quant sufficient 5 days 5)  Keflex 500 Mg Caps (Cephalexin) .[name].. 1 by mouth two times a day x 7days  Patient Instructions: 1)  Thank you for seeing me today. 2)  If you have chest pain, difficulty breathing, fevers over 102 that does not get better with tylenol please call us or see a doctor.  3)  Take the antibiotics for 1 week.   4)  Follow up with Dr. Alvester Morin.  5)  We will call you about the ENT appt.  Prescriptions: KEFLEX 500 MG CAPS (CEPHALEXIN) 1 by mouth two times a day x 7days  #14 x 0   Entered by:   Clementeen Graham MD   Authorized by:   Denny Levy MD   Signed by:   Clementeen Graham MD on 07/18/2010   Method used:   [facility]        [facility]  [address] [id number]       [address]       [place], [place]  [address]       Ph: [phone] or [phone]       Fax: [phone]   RxID:   [id number]

## 2010-11-19 NOTE — Progress Notes (Signed)
  Phone Note Outgoing Call Call back at [facility] Phone [phone]   Call placed by: Paula Compton MD,  May 27, 2010 3:54 PM Call placed to: Patient Summary of Call: Attempted to call patient with urine cx results, and to inquire about her general welfare.  Home number with message that phone either disconnected or out of service area, please try your call later.  Called work phone, told that personal calls not accepted.  Will send letter, and encourage followup of urinary sxs/abd pain at her upcoming OB visit with Dr Alvester Morin.  Initial call taken by: Paula Compton MD,  May 27, 2010 3:55 PM

## 2010-11-19 NOTE — Assessment & Plan Note (Signed)
Summary: discuss meds/eo   Vital Signs:  Patient profile:   30 year old female Height:      65.75 inches Weight:      216.2 pounds BMI:     35.29 Temp:     97.5 degrees F Pulse rate:   72 / minute BP sitting:   123 / 76  Vitals Entered By: Golden Circle RN (April 02, 2010 8:30 AM)  Primary Care Provider:  Romero Belling MD  CC:  allergic rhinitis and bipolar disorder.  History of Present Illness: 30 yo female here for  ALLERGIC RHINITIS (ICD-477.9) / ASTHMA (ICD-493.90) Nearly 3 week h/o nasal congestion, rhinorrhea, postnasal drip.  Fomerly with sinus pressure, but none now.  No fevers.  Some cough and wheeze, but no dyspnea.  Intermittent RIGHT ear fullness and pain.  Requests refills of her medicines:  Cetirizine, Veramyst, which she is still not taking.  Also requests Albuterol refill as she has felt like she might need it lately and currently has none.  BIPOLAR DISORDER (ICD-296.7) Requests refill of Abilify.  Last prescription was 2 month supply in December 2010.  She has not taken any in two months.  Today she feels "up and down", with decreased energy and intermittent panic attacks.  She also endorses anhedonia, but denies manic symptoms.  She denies suicidal ideation.   Habits & Providers  Alcohol-Tobacco-Diet     Alcohol drinks/day: 0     Tobacco Status: quit > 6 months  Exercise-Depression-Behavior     Does Patient Exercise: yes     Times/week: 7     Drug Use: never     Seat Belt Use: always     Sun Exposure: infrequent  Allergies: 1)  Zoloft (Sertraline Hcl)  Social History: Smoking Status:  quit > 6 months Seat Belt Use:  always Drug Use:  never Sun Exposure-Excessive:  infrequent Does Patient Exercise:  yes  Physical Exam  Additional Exam:  VITALS:  Reviewed, normal GEN: Alert & oriented, no acute distress NECK: Midline trachea, no masses/thyromegaly, no cervical lymphadenopathy CARDIO: Regular rate and rhythm, no murmurs/rubs/gallops, 2+  bilateral radial pulses RESP: Clear to auscultation, normal work of breathing, no retractions/accessory muscle use EARS:  External ear without significant lesions or deformities.  Clear canals, TM intact bilaterally without bulging, retraction, inflammation or discharge. Hearing grossly normal bilaterally. NOSE:  Nasal mucosa are pink and moist without lesions or exudates. MOUTH:  Oral mucosa and oropharynx without lesions or exudates. PSYCH:  Cognition and judgment appear intact. Alert and cooperative with normal attention span and concentration. No apparent delusions, illusions, hallucinations.  No anxious or depressed appearing.  No signs of agitation or mania.   Impression & Recommendations:  Problem # 1:  ALLERGIC RHINITIS (ICD-477.9) Assessment Improved Medications refilled. Her updated medication list for this problem includes:    Veramyst 27.5 Mcg/spray Susp (Fluticasone furoate) .[name]... 2 sprays per nostril daily    Cetirizine Hcl 10 Mg Tabs (Cetirizine hcl) ..... One at bedtime  Orders: [facility]- Est Level  3 ([address]14)  Problem # 2:  BIPOLAR DISORDER (ICD-296.7) Assessment: Deteriorated Feels her mood is deteriorated and she is more anxious, but denies mania and suicidal ideation.  She has historically had a hard time getting pregnant and is currently trying to get pregnant.  We discussed that Abilify has a class C rating for pregnancy, so I don't feel comfortable starting it while she tries to get pregnant, even though the patient asserts that her likelihood of pregnancy is very low  which is probably true.  I have asked her to call the [facility] for further evaluation of safe medications during pregnancy.  She states she has their number and will call--she has been seen there before.  Orders: [facility]- Est Level  3 ([address]69)  Problem # 3:  ASTHMA (ICD-493.90) Assessment: Improved No signs of asthma on exam today--normal work of breathing, no wheeze, no prolonged expiration.  She  requests refill of albuterol today as she is currently having allergic rhinitis with occasional cough and wheeze.  Advised to call if uses Albuterol more than two times a day. Her updated medication list for this problem includes:    Ventolin Hfa 108 (90 Base) Mcg/act Aers (Albuterol sulfate) .[name]... 2 puffs every 4 hours as needed wheezing  Orders: [facility]- Est Level  3 ([address]52)  Complete Medication List: 1)  Abilify 15 Mg Tabs (Aripiprazole) .[name].. 1 tab by mouth daily for bipolar disorder 2)  Veramyst 27.5 Mcg/spray Susp (Fluticasone furoate) .... 2 sprays per nostril daily 3)  Cvs Prenatal 28-0.8 Mg Tabs (Prenatal vit-fe fumarate-fa) .[name].. 1 by mouth daily (may substitute) 4)  Cetirizine Hcl 10 Mg Tabs (Cetirizine hcl) .... One at bedtime 5)  Ventolin Hfa 108 (90 Base) Mcg/act Aers (Albuterol sulfate) .... 2 puffs every 4 hours as needed wheezing 6)  Polyethylene Glycol 3350 Powd (Polyethylene glycol 3350) .Marland KitchenMarland KitchenMarland Kitchen 17 grams in 8 ounces of water two times a day until bowel movement, then once daily.  disp qs x1 month. 7)  Metformin Hcl 500 Mg Tabs (Metformin hcl) .[name].. 1 tab by mouth daily for pcos 8)  Ondansetron Hcl 4 Mg Tabs (Ondansetron hcl) .... One tab by mouth q6 hours as needed for nausea.  Patient Instructions: 1)  Pleasure to see you today. 2)  I have refilled your allergy medicines and albuterol. 3)  Please call the clinic if you develop fever, or start to use your albuterol two times a day. 4)  Please schedule a visit in the Mood Disorder clinic as soon as possible to discuss restarting Abilify. Prescriptions: VENTOLIN HFA 108 (90 BASE) MCG/ACT AERS (ALBUTEROL SULFATE) 2 puffs every 4 hours as needed wheezing  #1 x 1   Entered and Authorized by:   Romero Belling MD   Signed by:   Romero Belling MD on 04/02/2010   Method used:   [facility]        [facility]  [address] [id number]       [address]       [place], [place]  [address]       Ph: [phone] or  [phone]       Fax: [phone]   RxID:   [id number] CETIRIZINE HCL 10 MG TABS (CETIRIZINE HCL) one at bedtime  #30 x 1   Entered and Authorized by:   Romero Belling MD   Signed by:   Romero Belling MD on 04/02/2010   Method used:   [facility]ically [facility]        [facility]  [address] [id number] ([facility]ail)       [address]       [place], [place]  [address]       Ph: [phone] or [phone]       Fax: [phone]   RxID:   [id number] VERAMYST 27.5 MCG/SPRAY SUSP (FLUTICASONE FUROATE) 2 sprays per nostril daily  #1 x 1   Entered and Authorized by:  Romero Belling MD   Signed by:   Romero Belling MD on 04/02/2010   Method used:   [facility]ically to        [facility]  [address] [id number] (retail)       [address]       [place], [place]  [address]       Ph: [phone] or [phone]       Fax: [phone]   RxID:   [id number]

## 2010-11-19 NOTE — Miscellaneous (Signed)
Summary: [facility] to [facility]  Clinical Lists Changes rec'd a call from [facility] that this pt was there for c/o R knee pain. I asked him to send her over as we can see her now.Golden Circle RN  December 12, 2009 11:19 AM

## 2010-11-19 NOTE — Consult Note (Signed)
Summary: [facility]   Imported By: Bradly Bienenstock 01/24/2010 09:02:22  _____________________________________________________________________  External Attachment:    Type:   Image     Comment:   External Document

## 2010-11-19 NOTE — Assessment & Plan Note (Signed)
Summary: flu virus, ED f/u visit   Vital Signs:  Patient profile:   30 year old female Weight:      217.2 pounds Temp:     97.6 degrees F oral Pulse rate:   84 / minute BP sitting:   127 / 81  (right arm)  Vitals Entered By: Arlyss Repress CMA, (November 22, 2009 8:57 AM) CC: body aches and chills, cough x 2 days. Is Patient Diabetic? No Pain Assessment Patient in pain? yes     Location: body Intensity: 6 Onset of pain  x 2 days.   Primary Care Provider:  Romero Belling MD  CC:  body aches and chills and cough x 2 days.[name]  History of Present Illness: Flu Virus: 30 y/o F who comes in for an acute visit. She has been having congestion, cough and a runny nose for the last 2 weeks. Two days ago she developed body aches in her back, legs and arms. She did not get the flu vaccine this year. 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 She is very tired. She says she has been having some chills and sweats. It started less thatn 48 hours ago.  ED visit Pt was seen in ED on 11-20-09 for unrelated issue. She had chest pain which has resolved and not returned. She had a paced EKG (has a congentital heart problem and has had a pacemaker since she was 30 y/o). Her CXR showed no acute cardiopulmonary abnormality. Pt has had no more chest pain.   Habits & Providers  Alcohol-Tobacco-Diet     Tobacco Status: quit  Current Medications (verified): 1)  Abilify 15 Mg Tabs (Aripiprazole) .[name].. 1 Tab By Mouth Daily For Bipolar Disorder 2)  Veramyst 27.5 Mcg/spray Susp (Fluticasone Furoate) .... 2 Sprays Per Nostril Daily 3)  Cvs Prenatal 28-0.8 Mg  Tabs (Prenatal Vit-Fe Fumarate-Fa) .[name].. 1 By Mouth Daily (May Substitute) 4)  Cetirizine Hcl 10 Mg Tabs (Cetirizine Hcl) .... One At Bedtime 5)  Ventolin Hfa 108 (90 Base) Mcg/act Aers (Albuterol Sulfate) .... 2 Puffs Every 4 Hours As Needed Wheezing 6)  Polyethylene Glycol 3350  Powd (Polyethylene Glycol 3350) .Marland KitchenMarland KitchenMarland Kitchen 17 Grams in 8 Ounces of Water Two Times A Day  Until Bowel Movement, Then Once Daily.  Disp Qs X1 Month. 7)  Ranitidine Hcl 150 Mg Tabs (Ranitidine Hcl) .[name].. 1 Tab By Mouth Two Times A Day For Reflux 8)  Metformin Hcl 500 Mg Tabs (Metformin Hcl) .[name].. 1 Tab By Mouth Daily For Pcos 9)  Tamiflu 75 Mg Caps (Oseltamivir Phosphate) .... Take One Pill Twice A Day For 5 Days.  Allergies: 1)  Zoloft (Sertraline Hcl)  Social History: Smoking Status:  quit  Review of Systems       body chills and sweats and body aches, no diarrhea, constipation, or vomiting. No Gi pain. no further chest pain.   Physical Exam  General:  Well-developed,well-nourished,in no acute distress; alert,appropriate and cooperative throughout examination Lungs:  Normal respiratory effort, chest expands symmetrically. Lungs are clear to auscultation, no crackles or wheezes. Heart:  Normal rate and regular rhythm. S1 and S2 normal without gallop, murmur, click, rub or other extra sounds.   Impression & Recommendations:  Problem # 1:  FLU SYNDROME (ICD-487.1) Assessment New Pt has flu symptoms and will be treated with 5 days of Tamiflu since she is in the 48 hour window. She has not been vaccinated.   Future Orders: [facility]- Est  Level 4 ([address]) ... 11/23/2009  Problem # 2:  CHEST PAIN, HX OF (ICD-V12.50) Assessment: Improved Pt went to ED on 11-20-09 with chest pain but it has resolved and ED tests were neg.   Future Orders: [facility]- Est  Level 4 ([address]) ... 11/23/2009  Complete Medication List: 1)  Abilify 15 Mg Tabs (Aripiprazole) .[name].. 1 tab by mouth daily for bipolar disorder 2)  Veramyst 27.5 Mcg/spray Susp (Fluticasone furoate) .... 2 sprays per nostril daily 3)  Cvs Prenatal 28-0.8 Mg Tabs (Prenatal vit-fe fumarate-fa) .[name].. 1 by mouth daily (may substitute) 4)  Cetirizine Hcl 10 Mg Tabs (Cetirizine hcl) .... One at bedtime 5)  Ventolin Hfa 108 (90 Base) Mcg/act Aers (Albuterol sulfate) .... 2 puffs every 4 hours as needed wheezing 6)  Polyethylene Glycol 3350 Powd  (Polyethylene glycol 3350) .Marland KitchenMarland KitchenMarland Kitchen 17 grams in 8 ounces of water two times a day until bowel movement, then once daily.  disp qs x1 month. 7)  Ranitidine Hcl 150 Mg Tabs (Ranitidine hcl) .[name].. 1 tab by mouth two times a day for reflux 8)  Metformin Hcl 500 Mg Tabs (Metformin hcl) .[name].. 1 tab by mouth daily for pcos 9)  Tamiflu 75 Mg Caps (Oseltamivir phosphate) .... Take one pill twice a day for 5 days.  Patient Instructions: 1)  You likely have the flu virus and I think you should stay out of work until Monday.  2)  Take the Tamiflu twice a day for the next 5 days. 3)  Drink plenty of water (8-10 glasses) and hot herbal teas. 4)  Get plenty of rest, make sure to wash your hands to decrease the transmission of the virus to others.  5)  It was nice to meet you today.  Prescriptions: TAMIFLU 75 MG CAPS (OSELTAMIVIR PHOSPHATE) take one pill twice a day for 5 days.  #10 x 0   Entered and Authorized by:   Jamie Brookes MD   Signed by:   Jamie Brookes MD on 11/22/2009   Method used:   [facility]  [address] [id number]       [address]       [place], [place]  [address]       Ph: [phone] or [phone]       Fax: [phone]   RxID:   [id number]

## 2010-11-19 NOTE — Progress Notes (Signed)
  Phone Note Outgoing Call   Call placed by: Jimmy Footman, CMA,  August 07, 2010 12:13 PM Call placed to: Patient Summary of Call: LM for pt to call back to inform of ENT referral appt.  [facility]NT for november 1st @ 1:30pm pt to arrive @ 1:15pm..Sara Logan Bores, [facility]  August 07, 2010 12:13 PM   Follow-up for Phone Call        pt informed Follow-up by: De Nurse,  August 07, 2010 3:12 PM

## 2010-11-19 NOTE — Assessment & Plan Note (Signed)
Summary: r knee pain/[place]   Vital Signs:  Patient profile:   30 year old female Weight:      216.8 pounds Pulse rate:   80 / minute BP sitting:   102 / 76  (left arm)  Vitals Entered By: Renato Battles slade,cma CC: right knee pain x 1 week Is Patient Diabetic? No Pain Assessment Patient in pain? yes     Location: right knee Intensity: 5 Onset of pain  x 1 week   Primary Care Provider:  Romero Belling MD  CC:  right knee pain x 1 week.  History of Present Illness: CC: right knee pain  1 wk h/o right knee pain, starts hurting when standing up on it for about 2 hours.  Doesn't remember any inciting events , trauma, injury to knee.  No h/o knee pain in past.  At first felt tight, but not really swollen.  Hasn't tried anything yet.  Hurts to bend.  No fevers, chills, no erythema but feels hot at times.  No other joint pains.  Habits & Providers  Alcohol-Tobacco-Diet     Tobacco Status: quit > 6 months  Allergies: 1)  Zoloft (Sertraline Hcl)  Past History:  Past medical, surgical, family and social histories (including risk factors) reviewed for relevance to current acute and chronic problems.  Past Medical History: Reviewed history from 09/20/2008 and no changes required. Born with AV heart block, S/p pacemaker Gyn:  - PCOS (has been on statin, metformin, and Bblocker in the past) - infertility - has h/o R fallopian scarring, currently working with infertility specialist to become pregnant trichomonas (02/2005), (08/2005) Bipolar;  Disability for some type of mental health thing "can't understand things"  Past Surgical History: Reviewed history from 03/23/2008 and no changes required. End-of-life pulse generator (?defib), pacemaker- 06/23/2003 RUQ U/S - cholelithiasis - 03/01/2004 nasal plate due to broken nose  Family History: Reviewed history from 09/20/2008 and no changes required. 1 brother - asthma 3 sisters - one with congenital heart dz. Father - gout, HTN,  asthma, anxiety Mother - Lupus, migraines, h/o MI (22s), `heart dz`, HTN, bipolar disoder grandfather with colon cancer, otherwise none niece with ADHD  Social History: Reviewed history from 09/24/2009 and no changes required. No ETOH, no smoking, no drugs.  Currently working as a Lawyer.Smoking Status:  quit > 6 months  Physical Exam  General:  Well-developed,well-nourished,in no acute distress; alert,appropriate and cooperative throughout examination Msk:  R knee - + swelling medial and lateral to superior patella.  + slight warmth.  No erythema.  + crepitus R>L at patellar tendon with extension/flexion.  + pain with palpation medial and lateral to patella.  Negative mcmurray's sign, drawer test, no ligamentous laxity with valgus/varus.   Impression & Recommendations:  Problem # 1:  PATELLAR TENDINITIS (ICD-726.64)  treat conservatively with NSAIDs and ice.  Also in differential is other monoarthritis causes.  RTC 1 wk if not improving, red flags to return immediately discussed (see pt instructions).  could consider aspiration if not improved with this treatment.  Orders: [facility]- Est Level  3 ([address])  Complete Medication List: 1)  Abilify 15 Mg Tabs (Aripiprazole) .[name].. 1 tab by mouth daily for bipolar disorder 2)  Veramyst 27.5 Mcg/spray Susp (Fluticasone furoate) .... 2 sprays per nostril daily 3)  Cvs Prenatal 28-0.8 Mg Tabs (Prenatal vit-fe fumarate-fa) .[name].. 1 by mouth daily (may substitute) 4)  Cetirizine Hcl 10 Mg Tabs (Cetirizine hcl) .... One at bedtime 5)  Ventolin Hfa 108 (90 Base) Mcg/act Aers (Albuterol  sulfate) .... 2 puffs every 4 hours as needed wheezing 6)  Polyethylene Glycol 3350 Powd (Polyethylene glycol 3350) .Marland KitchenMarland KitchenMarland Kitchen 17 grams in 8 ounces of water two times a day until bowel movement, then once daily.  disp qs x1 month. 7)  Ranitidine Hcl 150 Mg Tabs (Ranitidine hcl) .[name].. 1 tab by mouth two times a day for reflux 8)  Metformin Hcl 500 Mg Tabs (Metformin hcl) .[name].. 1 tab by  mouth daily for pcos 9)  Naprosyn 500 Mg Tabs (Naproxen) .... Take one by mouth two times a day with food x 10 days then as needed pain  Patient Instructions: 1)  You may have something called patellar tendinitis.  Treat with anti inflammatory prescribed today (naprosyn twice daily with food) and icing area for 10-15 minutes at a time three times a day. 2)  IF not improving as expected, please return to be seen Monday. 3)  If worsening, or redness, swelling, heat, more painful, or fevers, you need to seek urgent medical care. 4)  Call clinic with questions.  5)  Pleasure to see you today. Prescriptions: NAPROSYN 500 MG TABS (NAPROXEN) take one by mouth two times a day with food x 10 days then as needed pain  #30 x 0   Entered and Authorized by:   Eustaquio Boyden  MD   Signed by:   Eustaquio Boyden  MD on 12/12/2009   Method used:   [facility]  [address] [id number]       [address]       [place], [place]  [address]       Ph: [phone] or [phone]       Fax: [phone]   RxID:   [id number]

## 2010-11-19 NOTE — Assessment & Plan Note (Signed)
Summary: sinus pressure   Vital Signs:  Patient profile:   30 year old female Weight:      217.1 pounds Temp:     98.5 degrees F oral Pulse rate:   80 / minute Pulse rhythm:   regular BP sitting:   118 / 78  (left arm) Cuff size:   regular  Vitals Entered By: Loralee Pacas CMA (March 28, 2010 10:06 AM) CC: sinus infection x 2 weeks   Primary Care Provider:  Romero Belling MD  CC:  sinus infection x 2 weeks.  History of Present Illness: patient with h/o allergic rhinitis; currently not taking prescribed meds (veramyst, cetirizine)  SINUSITIS Onset:  2 weeks Location: bilateral frontal sinuses Description: pressure, +lightheadedness, +vertiginous symptoms.  Modifying factors: patient has not tried anything  Symptoms Cough: yes  Discharge: no  Fever: no Sinus Pressure: yes  Ears Blocked: yes  Teeth Ache: no  Frontal Headache: yes  Second Sickening: no        patient also complaining of nausea.   Red Flags Change in mental state: no Change in vision: no    Current Medications (verified): 1)  None  Allergies (verified): 1)  Zoloft (Sertraline Hcl)  Physical Exam  General:  obese female, NAD, vitals reviewed.  Head:  no TTP over bilateral maxillary and frontal sinuses Eyes:  No corneal or conjunctival inflammation noted. EOMI. Perrla.  Ears:  External ear exam shows no significant lesions or deformities.  Otoscopic examination reveals clear canals, tympanic membranes are intact bilaterally with serous fluid posterior to R TM.  Nose:  External nasal examination shows no deformity or inflammation. Nasal mucosa are pink and moist without lesions or exudates. Mouth:  Oral mucosa and oropharynx without lesions or exudates.  Teeth in good repair. no postnasal drip Neck:  No deformities, masses, or tenderness noted. Lungs:  Normal respiratory effort, chest expands symmetrically. Lungs are clear to auscultation, no crackles or wheezes. Heart:  Normal rate and regular  rhythm. S1 and S2 normal without gallop, murmur, click, rub or other extra sounds.   Impression & Recommendations:  Problem # 1:  ALLERGIC RHINITIS (ICD-477.9) Assessment Deteriorated  do not think symptoms consistent with actual sinusitis. recommend resuming prior prescribed therapy, f/u if symptoms worsen.  patient expressed understanding.   Her updated medication list for this problem includes:    Veramyst 27.5 Mcg/spray Susp (Fluticasone furoate) .[name]... 2 sprays per nostril daily    Cetirizine Hcl 10 Mg Tabs (Cetirizine hcl) ..... One at bedtime  Orders: [facility]- Est Level  3 ([address]67)  Problem # 2:  BIPOLAR DISORDER (ICD-296.7) Assessment: Comment Only patient requested refill of abilify. last prescribed thirty with one refill in December 2010. recommended patient schedule appt with PCP to discuss restart.   Patient Instructions: 1)  Schedule appointment with Dr. Constance Goltz as soon as possible to discuss restarting medications.  2)  If you develop fever, worsening headache, or other concerns, call our office sooner.  3)  Use warm moist compresses, and over the counter decongestants( only as directed). Call if no improvement in 5-7 days, sooner if increasing pain, fever, or new symptoms.  Prescriptions: ONDANSETRON HCL 4 MG TABS (ONDANSETRON HCL) one tab by mouth q6 hours as needed for nausea.  #20 x 0   Entered and Authorized by:   Lequita Asal  MD   Signed by:   Lequita Asal  MD on 03/28/2010   Method used:   [facility]        [facility]  [address] [id number]       [address]       [place], [place]  [address]       Ph: [phone] or [phone]       Fax: [phone]   RxID:   [id number] VERAMYST 27.5 MCG/SPRAY SUSP (FLUTICASONE FUROATE) 2 sprays per nostril daily  #1 x 2   Entered and Authorized by:   Lequita Asal  MD   Signed by:   Lequita Asal  MD on 03/28/2010   Method used:   [facility]  [address]  [id number]       [address]       [place], [place]  [address]       Ph: [phone] or [phone]       Fax: [phone]   RxID:   [id number]

## 2010-11-19 NOTE — Progress Notes (Signed)
Summary: triage  Phone Note Call from Patient Call back at Home Phone [phone]   Caller: Patient Summary of Call: not sure if she should go back to work b/c her body is aching and is not having diarrhea but still doesn't feel good- pls advise Initial call taken by: De Nurse,  December 21, 2009 10:28 AM  Follow-up for Phone Call        she is not taking anything for her aches. suggested advil or tylenol. she is going to take Vit C "to build my immune system up". told her the decision to work is hers. if she needs a note to not go, she will need to be seen by md. states she will go to work Follow-up by: Golden Circle RN,  December 21, 2009 10:44 AM

## 2010-11-19 NOTE — Letter (Signed)
Summary: *Referral Letter  [facility]  [address]   [place], [place] [address]   Phone: [phone]  Fax: [phone]    06/06/2010  Thank you in advance for agreeing to see my patient:  Makayla Vasquez [address] 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   Procedures Requested:   Current Medical Problems: 1)  BACTERIAL VAGINITIS (ICD-616.10) 2)  POLYURIA (ICD-788.42) 3)  PREGNANCY, PRIMIGRAVIDA (ICD-V22.0) 4)  UNSPECIFIED CARDIAC DYSRHYTHMIA (ICD-427.9) 5)  UNSPEC DISORDER CARBOHYDRATE TRANSPORT&METAB (ICD-271.9) 6)  RH FACTOR, NEGATIVE (ICD-656.10) 7)  SUPERVISION OF NORMAL FIRST PREGNANCY (ICD-V22.0) 8)  PACEMAKER, PERMANENT (ICD-V45.01) 9)  BIPOLAR DISORDER (ICD-296.7) 10)  HEART BLOCK (ICD-426.9) 11)  OBESITY, NOS (ICD-278.00) 12)  ASTHMA (ICD-493.90) 13)  CHEST PAIN, HX OF (ICD-V12.50) 14)  CONTRACEPTIVE MANAGEMENT (ICD-V25.09) 15)  PARESTHESIA (ICD-782.0) 16)  ALLERGIC RHINITIS (ICD-477.9) 17)  ESOPHAGEAL REFLUX (ICD-530.81) 18)  IRRITABLE BOWEL SYNDROME (ICD-564.1) 19)  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  Disp QS x1 month. 4)  METROGEL-VAGINAL 0.75 % GEL (METRONIDAZOLE) SIG: Apply 5g per vagina at bedtime, for 5 days DISP Quant sufficient 5 days   Past Medical History: 1)  Born with AV heart block, S/p pacemaker 2)  Gyn:  3)  - PCOS (has been on statin, metformin, and Bblocker in the past) 4)  - infertility - has h/o R fallopian scarring, has worked with infertility specialist  in past to become pregnant 5)  trichomonas (02/2005), (08/2005) 6)  Bipolar;  7)  Disability for some type of mental health thing "can't understand things"   Prior History of Blood Transfusions:   Pertinent Labs:    Thank you again for agreeing to see our patient; please contact us if you have any further questions or need additional information.  Sincerely,  Doree Albee MD  Appended Document: *Referral Letter mailed  Appended Document: *Referral Letter Above was an error.  Letter not mailed.  Letter along wtih notes faxed to [facility] for appt.

## 2010-11-19 NOTE — Assessment & Plan Note (Signed)
Summary: OB/DSL   Vital Signs:  Patient profile:   30 year old female Height:      65.75 inches Weight:      219 pounds BMI:     35.75 BSA:     2.07 Temp:     98.0 degrees F Pulse rate:   76 / minute BP sitting:   124 / 84  Vitals Entered By: Jone Baseman CMA (May 07, 2010 1:51 PM) CC: OB   Primary Care Provider:  Romero Belling MD  CC:  OB.  History of Present Illness: 30 YO G1PO w/ PMHx/o 3rd AV block, s/p pacemaker, bipolar d/o, PCOS here at 9 5/7 weeks here for followup prenatal visit Pt denies ant abd pain, HA, vaginal bleeding, vaginal discharge. Pt does report intermittent nausea, was recommended to try eating smaller meals and vitamin B6 to which pt has not attempted. Nausea intermittent per pt. Pt reports good relationship w/ FOB, w/ stabel home environment, currently no stressors at home.  Cardiac: Pt denies any CP, SOB, palpitation, scheduled for followup cardiology visit in 1 1/2 months w/ Dr. Deborah Chalk per pt.  Psych: Mood stable per pt since transitioning off abilify. Has not yet scheduled appt at mood disorder clinic.     Allergies: 1)  Zoloft (Sertraline Hcl)  Physical Exam  General:  alert, well-hydrated, and overweight-appearing.   Head:  normocephalic and atraumatic.   Eyes:  vision grossly intact.   Ears:  R ear normal and L ear normal.   Mouth:  good dentition.   Neck:  large neck girth, full ROM  Lungs:  CTAB, no wheezes, rales, rhoncii Heart:  RRR, no murmurs auscultated. L chest scar noted Abdomen:  obese, + bowel sounds, difficult to assess fundal height in setting if early gest age/ Extremities:  no edema Neurologic:  alert & oriented X3.     Impression & Recommendations:  Problem # 1:  PREGNANCY, PRIMIGRAVIDA (ICD-V22.0) Pregnancy red flags reviewed w/ pt at length-otherwise normal prenatal course to date apart from significant medical history (see below). Pt declincing integrated and quad screening. Adequated diet to date. PAP performed  09/2009 (negative), GC/Chl performed 03/2010 (negative) at [facility]. Plan to otherwise followup in 4 weeks.  Orders: [facility]- Est Level  3 ([address])  Problem # 2:  HEART BLOCK (ICD-426.9) Otherwise stabel per pt. Plan to obtain/review cardiac medical records. Plan to also obtain baseline EKG to assess baseline pacemaker function. Cardiac red flags reviewed w/ pt at length.  Orders: EKG- [facility] (EKG)  Problem # 3:  BIPOLAR DISORDER (ICD-296.7) No mood deranement per pt. No HI/SI. Reinforced w/ pt need for follwoup in mood disorder clinic to follow mood throughout pregnancy. Red flgas reviewed w/ pt. Also plan to obtain previous psych medical records.   Complete Medication List: 1)  Cvs Prenatal 28-0.8 Mg Tabs (Prenatal vit-fe fumarate-fa) .[name].. 1 by mouth daily (may substitute) 2)  Ventolin Hfa 108 (90 Base) Mcg/act Aers (Albuterol sulfate) .... 2 puffs every 4 hours as needed wheezing 3)  Polyethylene Glycol 3350 Powd (Polyethylene glycol 3350) .Marland KitchenMarland KitchenMarland Kitchen 17 grams in 8 ounces of water two times a day until bowel movement, then once daily.  disp qs x1 month.  Other Orders: Glucose-[facility] ([id number]) TSH-[facility] [phone])  Patient Instructions: 1)  It was good meeting you today 2)  Continue eating small meals for your nausea  3)  You can also take vitamin B6 for nausea 4)  For your reflux, you can take tums as needed 5)  Dont forget to  setup your appointment in mood disorder clinic 6)  Please have you r cardiologist forward any new medical records 7)  Please have your psychiatrist forward any new medical records 8)  If you have any worseing of your palpitations that are greater than normal, or have severe shortness of breath please give [place] a call or got to the emergency room 9)  Otherwise followup w/ me in 4 weeks 10)  God Bless,  11)  Doree Albee, MD Prescriptions: [facility]S PRENATAL 28-0.8 MG  TABS (PRENATAL VIT-FE FUMARATE-FA) 1 by mouth daily (may substitute)  #34 x 11   Entered by:   Jone Baseman CMA    Authorized by:   Doree Albee MD   Signed by:   Jone Baseman CMA on 05/07/2010   Method used:   [facility]  [address] [id number]       [address]       [place], [place]  [address]       Ph: [phone] or [phone]       Fax: [phone]   RxID:   [id number]    Flowsheet View for Follow-up Visit    Estimated weeks of       gestation:     10 4/7    Weight:     219    Blood pressure:   124 / 84    Hx headache?     No    Nausea/vomiting?   nausea    Edema?     0    Bleeding?     no    Leakage/discharge?   no   OB Initial Intake Information    Positive HCG by: [facility]    Race: Black    Marital status: Single    Occupation: homemaker    Education (last grade completed): 12th grade    Number of children at home: 0    [facility] delivery: [facility]    Newborn's physician: [facility]  FOB Information    Husband/Father of baby: Weston Settle    FOB occupation Surveyor, mining at [employer]    Phone: [phone]  Menstrual History    LMP (date): 11/04/2007    Menarche: 12 years    Menses interval: irreg days    Menstrual flow 7 days    On BCP's at conception: no      Flowsheet View for Follow-up Visit    Estimated weeks of       gestation:     10 4/7    Weight:     219    Blood pressure:   124 / 84    Headache:     No    Nausea/vomiting:   nausea    Edema:     0    Vaginal bleeding:   no    Vaginal discharge:   no

## 2010-11-19 NOTE — Progress Notes (Signed)
Summary: triage  Phone Note Call from Patient Call back at Home Phone [phone]   Caller: Patient Summary of Call: Having real bad headache and nauseated. Initial call taken by: Clydell Hakim,  April 19, 2010 11:58 AM  Follow-up for Phone Call        LM Follow-up by: Golden Circle RN,  April 19, 2010 12:10 PM  Additional Follow-up for Phone Call Additional follow up Details #1::        states she thinks she has a sinus infection as well. no appts left for today. told her she can go to UC. she agreed Additional Follow-up by: Golden Circle RN,  April 19, 2010 2:40 PM

## 2010-11-19 NOTE — Miscellaneous (Signed)
Summary: cramping OB  Clinical Lists Changes states she has cramps similar to feeling like she has to urinate. no pain or burning. she is very upset. told her to come now for 9:15 with Dr. Mauricio Po.Golden Circle RN  May 21, 2010 8:43 AM

## 2010-11-19 NOTE — Miscellaneous (Signed)
Summary: Chart Summary  Clinical Lists Changes  Problems: Removed problem of PATELLAR TENDINITIS (ICD-726.64) Removed problem of FLU SYNDROME (ICD-487.1) Removed problem of FREQUENCY, URINARY (ICD-788.41) Medications: Removed medication of RANITIDINE HCL 150 MG TABS (RANITIDINE HCL) 1 tab by mouth two times a day for reflux Removed medication of NAPROSYN 500 MG TABS (NAPROXEN) take one by mouth two times a day with food x 10 days then as needed pain

## 2010-11-19 NOTE — Progress Notes (Signed)
Summary: triage  Phone Note Call from Patient Call back at Home Phone [phone]   Caller: Patient Summary of Call: Pt thinks she has sinus infection and wants to be seen today. Initial call taken by: Clydell Hakim,  March 28, 2010 8:47 AM  Follow-up for Phone Call        c/o pressure on face. no fever, but states she has chills. "tired all the time"  this has been going on x 2 wks. nasal drainage but congested. to come now for a work in appt Follow-up by: Golden Circle RN,  March 28, 2010 9:26 AM

## 2010-11-19 NOTE — Progress Notes (Signed)
Summary: Emergency Line Call  Phone Note Call from Patient Call back at Home Phone [phone]   Caller: Patient Summary of Call: Pt is  ~ [redacted]wks pregnant, c/o headache and visual changes starting today, also nauseated and cannot eat.  Advised these can herald pre-eclampsia and she should head to [facility] MAU.  Pt agreeable.  She has someone to drive her. Initial call taken by: Rodney Langton MD,  April 09, 2010 9:33 PM

## 2010-11-21 ENCOUNTER — Encounter: Payer: Self-pay | Admitting: Physician Assistant

## 2010-11-21 ENCOUNTER — Other Ambulatory Visit: Payer: Self-pay

## 2010-11-21 DIAGNOSIS — O368190 Decreased fetal movements, unspecified trimester, not applicable or unspecified: Secondary | ICD-10-CM

## 2010-11-21 LAB — POCT URINALYSIS DIPSTICK
Bilirubin Urine: NEGATIVE
Nitrite: NEGATIVE
Protein, ur: NEGATIVE mg/dL
pH: 5 (ref 5.0–8.0)

## 2010-11-21 NOTE — Letter (Signed)
Summary: Generic Letter  [facility]  [address]   [place], [place] [address]   Phone: [phone]  Fax: [phone]    11/01/2010  Meriel Pica [address] [place], [place]  [address]  To whom it may concern,  Janine Reller is mentally competent to make her own decisions. If there are any particular questions, please feel free to call the [facility] at your earliest convenience.            Sincerely,   Doree Albee MD

## 2010-11-21 NOTE — Progress Notes (Signed)
Summary: needs letter  Phone Note Call from Patient Call back at [phone]   Caller: Patient Summary of Call: pt is requesting letter for her to be able to make her own monitary decisions responsible for her own money - her mother is now responsible and she wants it changed. Initial call taken by: De Nurse,  October 30, 2010 4:13 PM  Follow-up for Phone Call        Called and spoke with pt about need for letter. Pt states that disability is requiring her to get letter from MD stating that she is capable of making her own decision before disability will give her checks in her name. Told pt that this was highly unusual. That I have never heard of documentation such as this. Pt denies hx/o MR. Unsure as to why this letter is needed, but letter is necessary for checks to be written in her name and not mother's name. Pt states that she lives on her own, pays her own bills, has been independent for >10 years. Told pt that I would have letter ready on Monday.

## 2010-11-22 ENCOUNTER — Inpatient Hospital Stay (HOSPITAL_COMMUNITY)
Admission: RE | Admit: 2010-11-22 | Discharge: 2010-11-26 | DRG: 766 | Disposition: A | Discharge status: Home / Self Care | Payer: Medicare Other | Source: Ambulatory Visit | Attending: Obstetrics & Gynecology | Admitting: Obstetrics & Gynecology

## 2010-11-22 DIAGNOSIS — O09519 Supervision of elderly primigravida, unspecified trimester: Secondary | ICD-10-CM | POA: Diagnosis present

## 2010-11-22 DIAGNOSIS — Z22330 Carrier of Group B streptococcus: Secondary | ICD-10-CM

## 2010-11-22 DIAGNOSIS — D649 Anemia, unspecified: Secondary | ICD-10-CM | POA: Diagnosis not present

## 2010-11-22 DIAGNOSIS — O409XX0 Polyhydramnios, unspecified trimester, not applicable or unspecified: Principal | ICD-10-CM | POA: Diagnosis present

## 2010-11-22 DIAGNOSIS — Z950 Presence of cardiac pacemaker: Secondary | ICD-10-CM

## 2010-11-22 DIAGNOSIS — O99892 Other specified diseases and conditions complicating childbirth: Secondary | ICD-10-CM | POA: Diagnosis present

## 2010-11-22 DIAGNOSIS — O9903 Anemia complicating the puerperium: Secondary | ICD-10-CM | POA: Diagnosis not present

## 2010-11-22 LAB — RPR: RPR Ser Ql: NONREACTIVE

## 2010-11-22 LAB — CBC
Hemoglobin: 10.7 g/dL — ABNORMAL LOW (ref 12.0–15.0)
MCHC: 32.6 g/dL (ref 30.0–36.0)
RDW: 14.8 % (ref 11.5–15.5)

## 2010-11-23 ENCOUNTER — Other Ambulatory Visit: Payer: Self-pay | Admitting: Obstetrics & Gynecology

## 2010-11-23 DIAGNOSIS — O9903 Anemia complicating the puerperium: Secondary | ICD-10-CM

## 2010-11-23 DIAGNOSIS — O409XX0 Polyhydramnios, unspecified trimester, not applicable or unspecified: Secondary | ICD-10-CM

## 2010-11-23 DIAGNOSIS — O99892 Other specified diseases and conditions complicating childbirth: Secondary | ICD-10-CM

## 2010-11-23 DIAGNOSIS — O9989 Other specified diseases and conditions complicating pregnancy, childbirth and the puerperium: Secondary | ICD-10-CM

## 2010-11-23 LAB — CBC
Platelets: 206 10*3/uL (ref 150–400)
RDW: 14.8 % (ref 11.5–15.5)
WBC: 6.6 10*3/uL (ref 4.0–10.5)

## 2010-11-24 LAB — CBC
Hemoglobin: 9.4 g/dL — ABNORMAL LOW (ref 12.0–15.0)
RBC: 3.38 MIL/uL — ABNORMAL LOW (ref 3.87–5.11)

## 2010-11-25 LAB — RH IMMUNE GLOB WKUP(>/=20WKS)(NOT WOMEN'S HOSP)
Fetal Screen: NEGATIVE
Unit division: 0

## 2010-11-27 NOTE — Progress Notes (Signed)
Summary: Letter  Phone Note Call from Patient Call back at Home Phone [phone] [facility]     Summary of Call: letter that was written was not signed by Dr. Alvester Morin, pt requesting the same letter with MDs signature Initial call taken by: Knox Royalty,  November 08, 2010 10:03 AM  Follow-up for Phone Call        Formal Social Security Letter filled out in clinic 11/20/10.  Mailed back to sender. Attempted to call back pt x2 w/ busy signal. Will send letter to pt for update.  Doree Albee MD November 21, 2010 2:58 PM

## 2010-12-04 NOTE — Op Note (Signed)
NAME:  Makayla Vasquez, Makayla Vasquez NO.:  [id number]  MEDICAL RECORD NO.:  [id number]           PATIENT TYPE:  I  LOCATION:  9107                          [facility]ILITY:  [facility]  PHYSICIAN:  Lesly Dukes, M.D. DATE OF BIRTH:  05-31-1981  DATE OF PROCEDURE:  11/23/2010 DATE OF DISCHARGE:                              OPERATIVE REPORT   PREOPERATIVE DIAGNOSES:  A 30 year old, gravida 1, para 0, and 39 weeks and 1 day estimated gestational age with polyhydramnios, nonreassuring fetal heart tracing.  POSTOPERATIVE DIAGNOSES:  A 30 year old, gravida 1, para 0, and 39 weeks and 1 day estimated gestational age with polyhydramnios, nonreassuring fetal heart tracing.  PROCEDURE:  Primary low transverse cesarean section.  SURGEON:  Lesly Dukes, MD  ASSISTANT:  Cam Hai, CNM  ANESTHESIA:  Spinal.  SPECIMENS:  Placenta to pathology.  ESTIMATED BLOOD LOSS:  800.  COMPLICATIONS:  None.  FINDINGS:  Viable female infant, vertex presentation, polyhydramnios, clear fluid.  Arterial cord gas 7.19, grossly normal uterus.  There are copious adhesions involving both adnexa bilaterally.  PROCEDURE IN DETAIL:  After informed consent was obtained, the patient was taken to the operating room where spinal anesthesia was found to be adequate.  The patient placed in dorsal supine position with leftward tilt.  SCDs on lower extremities.  Ancef 2 grams was given IV. Pfannenstiel skin incision was made with scalpel and carried down through fascia.  The fascia was incised in midline.  This incision was extended bilaterally with the Mayo scissors.  The superior and inferior aspects of the fascial incision were grasped with Kocher clamp, tented up, and dissected off sharply and bluntly from underlying layers of rectus muscles.  The rectus muscles were separated in the midline.  The peritoneum was entered bluntly and this incision was extended superior and inferiorly with good visualization  of the bladder.  Alexis retractor was placed into the abdomen and the bladder blade was inserted.  The uterine incision was made in transverse fashion in lower uterine segment and head delivered atraumatically.  The baby's body was delivered easily.  The cord was clamped and cut and the baby was handed off to awaiting pediatrician.  The placenta was delivered manually and had three-vessel cord.  The placenta was sent to Pathology.  Uterus was cleared of all clots and debris.  The uterine incision was closed using 0-Vicryl in a running locking fashion with good hemostasis.  A 0-Vicryl was used to obtain hemostasis.  The peritoneal cavity was copiously irrigated and found be hemostatic.  There were copious adhesions noted to be in both adnexa.  Good hemostasis was noted on the uterus one last time.  The rectus muscles and peritoneum were noted to be hemostatic. The fascia was closed with 0-Vicryl in a running fashion with good hemostasis.  The subcutaneous tissues was copiously irrigated and found to be hemostatic.  A 0-plain suture was used to reapproximate the subcutaneous tissue.  The skin was closed with staples.  The patient tolerated the procedure well.  Sponge, lap, instrument, and needle count was correct x2 and the patient went to the recovery room  in stable condition.     Lesly Dukes, M.D.     Lora Paula  D:  11/23/2010  T:  11/24/2010  Job:  [id number]  Electronically Signed by Elsie Lincoln M.D. on 12/04/2010 07:14:00 PM

## 2010-12-04 NOTE — Discharge Summary (Signed)
[name], Makayla Vasquez NO.:  [id number]  MEDICAL RECORD NO.:  [id number]           PATIENT TYPE:  I  LOCATION:  9107                          [facility]  PHYSICIAN:  Lesly Dukes, M.D. DATE OF BIRTH:  13-Mar-1981  DATE OF ADMISSION:  11/22/2010 DATE OF DISCHARGE:  11/26/2010                              DISCHARGE SUMMARY   DISCHARGE DIAGNOSES: 1. Intrauterine pregnancy at 39 weeks. 2. Primary low-transverse C-section secondary to nonreassuring fetal     heart sounds. 3. 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 4. Asthma. 5. Polycystic ovary syndrome. 6. Gastroesophageal reflux disease. 7. Bipolar disorder.  CONSULT:  [employer] representative for pacemaker interrogation with back up by cardiology.  DISCHARGE MEDICATIONS: 1. HCTZ 25 mg p.o. daily. 2. Ferrous sulfate 325 mg p.o. b.i.d. 3. MiraLax 17 g in 8 ounces of water slash juice p.o. b.i.d.  BRIEF HOSPITAL COURSE:  Briefly, this 30 year old G1, P1 who came in at 50 weeks for an induction of labor who had a primary low-transverse C- section within 25 hours of admission secondary to nonreassuring fetal heart tracing.  Cord pH was noted to the 7.19 at time of delivery. Bipolar Bovie was noted to be used intraoperatively as to avoid any interference with cardiac pacemaker intraoperatively.  The patient followed otherwise normal postoperative course throughout rest of the hospitalization with the patient being noted to have intermittent tachycardia. Prenatal Serologies:  O negative, antibody negative, rubella     immune, hemoglobin surface antigen negative, RPR nonreactive, HIV     negative, GC and chlamydia, GBS positive in urine(The patient recieved appropriate antibiotics on admission) BRIEF HOSPITAL COURSE:  1. Third-degree Heart Block:  The patient noted to have palpitations     and outpatient the patient having her pacemaker  as well as heart     profile by Dr. Deborah Chalk at [facility].  The patient is     known have palpitations at around 32 weeks of gestation.  The     patient was having an outpatient echocardiogram done with echo     showing moderately dilated cavity with moderately reduced function     with EF of 35-40% and diffuse hypokinesis.  At the time of     discharge, the patient's pacemaker interrogated by [employer]     representative to which primary cardiologist was consulted for set     up with the patient receiving pacemaker clearance prior to     discharge. 2. Postpartum Hypertension.  The patient was noted to have blood     pressures with systolic blood pressures into 161W on the day of     discharge where the patient received HCTZ 25 mg p.o. for treatment     with subsequent decrease in blood pressure in terms of 110s prior     to discharge and will be continued on outpatient setting with     outpatient follow-up for blood pressure checks at the [facility]. 3. Bipolar Disorder.  The patient is known to have  history of bipolar     disorder in the past.  However, the patient denies any bipolar     issues at the time of discharge.  Social work was also consulted     with no concerns prior to discharge.  The patient denies any     activating symptoms i.e. insomnia, pressured speech, flight of     ideas, any other concerning or alarming symptoms, depression, or     unipolar or bipolar depression.The patient denies being on medication for bipolar disorder prior to pregnancy.   FOLLOWUP:  The patient is instructed to followup with primary care provider, Dr. Doree Albee at [facility] in 6 weeks for postpartum followup.  The patient is also instructed to follow- up for outpatient blood pressure check in 2 weeks after discharge as well as followup with primary cardiologist within 4-8 weeks of discharge for primary cardiac  followup.     Doree Albee, MD   ______________________________ Lesly Dukes, M.D.    SN/MEDQ  D:  11/26/2010  T:  11/26/2010  Job:  [id number]  Electronically Signed by Doree Albee  on 12/01/2010 11:15:54 AM Electronically Signed by Elsie Lincoln M.D. on 12/04/2010 07:15:16 PM

## 2010-12-10 ENCOUNTER — Ambulatory Visit: Payer: Medicare Other | Admitting: Family Medicine

## 2010-12-10 NOTE — Progress Notes (Signed)
Patient in office today for BP check following delivery 3 weeks ago she states. BP LA 110/78 , RA 112/78 pulse 84. States she is taking meds as directed without any problem. Will forward message to Dr. Alvester Morin.

## 2010-12-17 ENCOUNTER — Inpatient Hospital Stay (INDEPENDENT_AMBULATORY_CARE_PROVIDER_SITE_OTHER)
Admission: RE | Admit: 2010-12-17 | Discharge: 2010-12-17 | Disposition: A | Discharge status: Home / Self Care | Payer: Medicare Other | Source: Ambulatory Visit

## 2010-12-17 DIAGNOSIS — J019 Acute sinusitis, unspecified: Secondary | ICD-10-CM

## 2010-12-17 DIAGNOSIS — R040 Epistaxis: Secondary | ICD-10-CM

## 2010-12-19 ENCOUNTER — Ambulatory Visit (INDEPENDENT_AMBULATORY_CARE_PROVIDER_SITE_OTHER): Payer: Medicare Other | Admitting: Cardiology

## 2010-12-19 DIAGNOSIS — E7800 Pure hypercholesterolemia, unspecified: Secondary | ICD-10-CM

## 2010-12-19 DIAGNOSIS — I442 Atrioventricular block, complete: Secondary | ICD-10-CM

## 2010-12-25 ENCOUNTER — Encounter: Payer: Self-pay | Admitting: Obstetrics and Gynecology

## 2010-12-25 ENCOUNTER — Ambulatory Visit: Payer: Medicare Other | Admitting: Obstetrics and Gynecology

## 2010-12-25 ENCOUNTER — Other Ambulatory Visit: Payer: Self-pay | Admitting: Obstetrics and Gynecology

## 2010-12-25 LAB — CONVERTED CEMR LAB
HCT: 35 % — ABNORMAL LOW (ref 36.0–46.0)
Hemoglobin: 11.2 g/dL — ABNORMAL LOW (ref 12.0–15.0)
MCV: 84.1 fL (ref 78.0–100.0)
RBC: 4.16 M/uL (ref 3.87–5.11)
WBC: 3.6 10*3/uL — ABNORMAL LOW (ref 4.0–10.5)

## 2010-12-27 ENCOUNTER — Encounter (INDEPENDENT_AMBULATORY_CARE_PROVIDER_SITE_OTHER): Payer: Medicaid Other | Admitting: Internal Medicine

## 2010-12-27 ENCOUNTER — Encounter: Payer: Self-pay | Admitting: Internal Medicine

## 2010-12-27 DIAGNOSIS — I442 Atrioventricular block, complete: Secondary | ICD-10-CM

## 2010-12-27 DIAGNOSIS — R0989 Other specified symptoms and signs involving the circulatory and respiratory systems: Secondary | ICD-10-CM

## 2010-12-27 DIAGNOSIS — I10 Essential (primary) hypertension: Secondary | ICD-10-CM

## 2010-12-30 LAB — CBC
HCT: 32.1 % — ABNORMAL LOW (ref 36.0–46.0)
Hemoglobin: 10.7 g/dL — ABNORMAL LOW (ref 12.0–15.0)
Hemoglobin: 10.9 g/dL — ABNORMAL LOW (ref 12.0–15.0)
MCH: 27.5 pg (ref 26.0–34.0)
MCV: 81.1 fL (ref 78.0–100.0)
Platelets: 210 10*3/uL (ref 150–400)
RBC: 3.87 MIL/uL (ref 3.87–5.11)
RBC: 3.96 MIL/uL (ref 3.87–5.11)
WBC: 3.6 10*3/uL — ABNORMAL LOW (ref 4.0–10.5)
WBC: 4.9 10*3/uL (ref 4.0–10.5)

## 2010-12-30 LAB — COMPREHENSIVE METABOLIC PANEL
ALT: 16 U/L (ref 0–35)
AST: 28 U/L (ref 0–37)
Alkaline Phosphatase: 60 U/L (ref 39–117)
CO2: 21 mEq/L (ref 19–32)
Chloride: 105 mEq/L (ref 96–112)
GFR calc Af Amer: >60 mL/min (ref >60)
GFR calc non Af Amer: >60 mL/min (ref >60)
Glucose, Bld: 88 mg/dL (ref 70–99)
Potassium: 4 mEq/L (ref 3.5–5.1)
Sodium: 133 mEq/L — ABNORMAL LOW (ref 135–145)

## 2010-12-30 LAB — POCT URINALYSIS DIPSTICK
Glucose, UA: NEGATIVE mg/dL
Ketones, ur: NEGATIVE mg/dL
Ketones, ur: NEGATIVE mg/dL
Nitrite: NEGATIVE
Nitrite: NEGATIVE
Protein, ur: NEGATIVE mg/dL
Specific Gravity, Urine: >=1.03 (ref 1.005–1.030)
Urobilinogen, UA: 1 mg/dL (ref 0.0–1.0)
Urobilinogen, UA: 1 mg/dL (ref 0.0–1.0)
pH: 5.5 (ref 5.0–8.0)
pH: 6 (ref 5.0–8.0)

## 2010-12-30 LAB — URINALYSIS, ROUTINE W REFLEX MICROSCOPIC
Bilirubin Urine: NEGATIVE
Ketones, ur: NEGATIVE mg/dL
Nitrite: NEGATIVE
Protein, ur: NEGATIVE mg/dL

## 2010-12-30 LAB — URINE MICROSCOPIC-ADD ON

## 2010-12-31 LAB — WET PREP, GENITAL
Clue Cells Wet Prep HPF POC: NONE SEEN
Trich, Wet Prep: NONE SEEN

## 2010-12-31 LAB — POCT URINALYSIS DIPSTICK
Bilirubin Urine: NEGATIVE
Glucose, UA: NEGATIVE mg/dL
Glucose, UA: NEGATIVE mg/dL
Hgb urine dipstick: NEGATIVE
Hgb urine dipstick: NEGATIVE
Hgb urine dipstick: NEGATIVE
Nitrite: NEGATIVE
Protein, ur: NEGATIVE mg/dL
Specific Gravity, Urine: >=1.03 (ref 1.005–1.030)
Specific Gravity, Urine: 1.015 (ref 1.005–1.030)
Urobilinogen, UA: 1 mg/dL (ref 0.0–1.0)
pH: 6 (ref 5.0–8.0)
pH: 5.5 (ref 5.0–8.0)
pH: 6.5 (ref 5.0–8.0)

## 2010-12-31 LAB — POCT I-STAT, CHEM 8
Calcium, Ion: 1.17 mmol/L (ref 1.12–1.32)
Glucose, Bld: 110 mg/dL — ABNORMAL HIGH (ref 70–99)
HCT: 34 % — ABNORMAL LOW (ref 36.0–46.0)
Hemoglobin: 11.6 g/dL — ABNORMAL LOW (ref 12.0–15.0)

## 2010-12-31 LAB — URINALYSIS, ROUTINE W REFLEX MICROSCOPIC
Bilirubin Urine: NEGATIVE
Bilirubin Urine: NEGATIVE
Glucose, UA: NEGATIVE mg/dL
Hgb urine dipstick: NEGATIVE
Hgb urine dipstick: NEGATIVE
Hgb urine dipstick: NEGATIVE
Nitrite: NEGATIVE
Specific Gravity, Urine: 1.025 (ref 1.005–1.030)
Specific Gravity, Urine: 1.021 (ref 1.005–1.030)
Specific Gravity, Urine: 1.025 (ref 1.005–1.030)
Urobilinogen, UA: 1 mg/dL (ref 0.0–1.0)
Urobilinogen, UA: 0.2 mg/dL (ref 0.0–1.0)
Urobilinogen, UA: 1 mg/dL (ref 0.0–1.0)
pH: 6 (ref 5.0–8.0)
pH: 6.5 (ref 5.0–8.0)

## 2010-12-31 LAB — URINE MICROSCOPIC-ADD ON

## 2010-12-31 LAB — URINE CULTURE: Culture  Setup Time: 201112122054

## 2010-12-31 LAB — DIFFERENTIAL
Basophils Absolute: 0 10*3/uL (ref 0.0–0.1)
Basophils Relative: 0 % (ref 0–1)
Lymphocytes Relative: 27 % (ref 12–46)
Monocytes Relative: 10 % (ref 3–12)
Neutro Abs: 3.4 10*3/uL (ref 1.7–7.7)
Neutrophils Relative %: 62 % (ref 43–77)

## 2010-12-31 LAB — CBC
Hemoglobin: 11.2 g/dL — ABNORMAL LOW (ref 12.0–15.0)
MCHC: 33.3 g/dL (ref 30.0–36.0)
WBC: 5.5 10*3/uL (ref 4.0–10.5)

## 2010-12-31 NOTE — Assessment & Plan Note (Signed)
Summary: pacer check/ [employer]   Primary Provider:  Doree Albee MD  CC:  gerd.  History of Present Illness: Makayla Vasquez is referred today by Dr. Deborah Chalk for ongoing eval and management of her PPM. 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 She has had one generator change. She recently gave birth via c-section. She denies c/p, sob, or peripheral edema. No other complaints today except for some reflux symptoms.  Current Medications (verified): 1)  Ventolin Hfa 108 (90 Base) Mcg/act Aers (Albuterol Sulfate) .... 2 Puffs Every 4 Hours As Needed Wheezing 2)  Polyethylene Glycol 3350  Powd (Polyethylene Glycol 3350) .Marland KitchenMarland KitchenMarland Kitchen 17 Grams in 8 Ounces of Water Two Times A Day Until Bowel Movement, Then Once Daily Prn  .  Disp Qs X1 Month. 3)  Multivitamins   Tabs (Multiple Vitamin) .[name].. 1 Tab By Mouth Once Daily 4)  Hydrochlorothiazide 25 Mg Tabs (Hydrochlorothiazide) .... Take One Tablet By Mouth Daily. 5)  Cetirizine Hcl 10 Mg Tabs (Cetirizine Hcl) .[name].. 1 Tab By Mouth Once Daily 6)  Ferrous Sulfate 325 (65 Fe) Mg  Tabs (Ferrous Sulfate) .[name].. 1  Tab By Mouth Once Daily  Allergies: 1)  Zoloft (Sertraline Hcl)  Past History:  Past Medical History: Last updated: 12/26/2010 Born with AV heart block, S/p pacemaker Gyn:  - PCOS (has been on statin, metformin, and Bblocker in the past) - infertility - has h/o R fallopian scarring, worked with infertility specialist to become pregnant trichomonas (02/2005), (08/2005) Preganancy 2012-Female infant Bipolar Disability for some type of mental health thing "can't understand things" Asthma Gastroesophageal reflux disease  Past Surgical History: Last updated: 04/08/2010 End-of-life pulse generator (?defib), pacemaker- 06/23/2003 RUQ U/S - cholelithiasis - 03/01/2004  cholecystectomy Had cyst lasered off throat nasal plate due to broken nose  Family History: Last updated: 04/08/2010 1 brother - asthma 3 sisters - one with congenital heart  dz. died 07/22/2023 from heart disease age 30, one with DM Father - gout, HTN, asthma, anxiety Mother - Lupus, migraines, h/o MI (52s), `heart dz`, HTN, bipolar disoder grandfather with colon cancer, otherwise none niece with ADHD  Social History: Last updated: 04/08/2010 No ETOH, no smoking, no drugs.  Currently unemployed.  Lives with boyfriend.  Has pekinese and pitt bull at home.  BF smokes outside  Review of Systems  The patient denies chest pain, syncope, dyspnea on exertion, and peripheral edema.    Vital Signs:  Patient profile:   30 year old female Height:      65 inches Weight:      235 pounds BMI:     39.25 Pulse rate:   85 / minute Resp:     14 per minute BP sitting:   112 / 75  (left arm)  Vitals Entered By: Kem Parkinson (December 27, 2010 3:22 PM)  Physical Exam  General:  VS ntoed. Well NAD. Head:  normocephalic and atraumatic.   Eyes:  vision grossly intact (bifocals in place) Mouth:  good dentition.   Neck:  Neck supple, no JVD. No masses, thyromegaly or abnormal cervical nodes. Chest Wall:  Well healed PPM incision. Lungs:  CTAB, no whezes, rales, rhoncii Heart:  RRR, no rubs, gallops, murmurs.  L upper chest scar stable. Abdomen:  gravid abdomen. Baseline obese abdomen.  Pulses:  pulses normal in all 4 extremities Extremities:  No clubbing or cyanosis. Neurologic:  Alert and oriented x 3.   PPM Specifications Following MD:  Lewayne Bunting, MD  Referring MD:  Roger Shelter, MD PPM Vendor:  [employer]     PPM Model Number:  [id number]     PPM Serial Number:  [id number] PPM DOI:  06/21/2003     PPM Implanting MD:  Roger Shelter, MD  Lead 1    Location: RA     DOI: 08/05/1996     Model #: 438-01     Serial #:     Status: active Lead 2    Location: RV     DOI: 08/05/1996     Model #: 438-01     Serial #:     Status: active  Magnet Response Rate:  BOL 85 ERI 65  Indications:  CHB  MD Comments:  Normal device function.   Impression &  Recommendations:  Problem # 1:  PACEMAKER, PERMANENT (ICD-V45.01) Her device is working normally. Will recheck in several months.  Problem # 2:  GERD (ICD-530.81) I have asked her to try some maalox and if her symptoms persist she would followup with her primary MD.  Patient Instructions: 1)  Your physician wants you to follow-up in:  6 months in the device clinic and 12 months with Dr Court Joy will receive a reminder letter in the mail two months in advance. If you don't receive a letter, please call our office to schedule the follow-up appointment. 2)  Your physician recommends that you continue on your current medications as directed. Please refer to the Current Medication list given to you today.

## 2011-01-01 LAB — URINALYSIS, ROUTINE W REFLEX MICROSCOPIC
Bilirubin Urine: NEGATIVE
Glucose, UA: NEGATIVE mg/dL
Hgb urine dipstick: NEGATIVE
Specific Gravity, Urine: >1.03 — ABNORMAL HIGH (ref 1.005–1.030)
pH: 6 (ref 5.0–8.0)

## 2011-01-02 LAB — POCT URINALYSIS DIPSTICK
Bilirubin Urine: NEGATIVE
Hgb urine dipstick: NEGATIVE
Ketones, ur: NEGATIVE mg/dL
Nitrite: POSITIVE — AB
Protein, ur: 30 mg/dL — AB
Protein, ur: NEGATIVE mg/dL
Specific Gravity, Urine: 1.02 (ref 1.005–1.030)
Specific Gravity, Urine: <=1.005 (ref 1.005–1.030)
Urobilinogen, UA: 2 mg/dL — ABNORMAL HIGH (ref 0.0–1.0)
Urobilinogen, UA: 0.2 mg/dL (ref 0.0–1.0)
pH: 6 (ref 5.0–8.0)

## 2011-01-02 LAB — COMPREHENSIVE METABOLIC PANEL
ALT: 16 U/L (ref 0–35)
AST: 19 U/L (ref 0–37)
CO2: 24 mEq/L (ref 19–32)
Chloride: 108 mEq/L (ref 96–112)
GFR calc Af Amer: >60 mL/min (ref >60)
GFR calc non Af Amer: >60 mL/min (ref >60)
Glucose, Bld: 79 mg/dL (ref 70–99)
Sodium: 136 mEq/L (ref 135–145)
Total Bilirubin: 0.6 mg/dL (ref 0.3–1.2)

## 2011-01-02 LAB — URINE MICROSCOPIC-ADD ON

## 2011-01-02 LAB — URINALYSIS, ROUTINE W REFLEX MICROSCOPIC
Bilirubin Urine: NEGATIVE
Hgb urine dipstick: NEGATIVE
Ketones, ur: NEGATIVE mg/dL
Nitrite: NEGATIVE
Protein, ur: NEGATIVE mg/dL
Specific Gravity, Urine: 1.015 (ref 1.005–1.030)
Urobilinogen, UA: 0.2 mg/dL (ref 0.0–1.0)
pH: 6 (ref 5.0–8.0)

## 2011-01-02 LAB — CBC
MCH: 29.7 pg (ref 26.0–34.0)
Platelets: 208 10*3/uL (ref 150–400)
RBC: 3.84 MIL/uL — ABNORMAL LOW (ref 3.87–5.11)
RDW: 13.4 % (ref 11.5–15.5)
WBC: 4.6 10*3/uL (ref 4.0–10.5)

## 2011-01-03 ENCOUNTER — Encounter: Payer: Self-pay | Admitting: Family Medicine

## 2011-01-03 ENCOUNTER — Ambulatory Visit (INDEPENDENT_AMBULATORY_CARE_PROVIDER_SITE_OTHER): Payer: Medicare Other | Admitting: Family Medicine

## 2011-01-03 ENCOUNTER — Telehealth: Payer: Self-pay | Admitting: Family Medicine

## 2011-01-03 DIAGNOSIS — I10 Essential (primary) hypertension: Secondary | ICD-10-CM

## 2011-01-03 DIAGNOSIS — I442 Atrioventricular block, complete: Secondary | ICD-10-CM

## 2011-01-03 DIAGNOSIS — F319 Bipolar disorder, unspecified: Secondary | ICD-10-CM

## 2011-01-03 DIAGNOSIS — K219 Gastro-esophageal reflux disease without esophagitis: Secondary | ICD-10-CM

## 2011-01-03 DIAGNOSIS — N898 Other specified noninflammatory disorders of vagina: Secondary | ICD-10-CM

## 2011-01-03 DIAGNOSIS — E785 Hyperlipidemia, unspecified: Secondary | ICD-10-CM

## 2011-01-03 DIAGNOSIS — D509 Iron deficiency anemia, unspecified: Secondary | ICD-10-CM

## 2011-01-03 DIAGNOSIS — E669 Obesity, unspecified: Secondary | ICD-10-CM

## 2011-01-03 LAB — RH IMMUNE GLOBULIN WORKUP (NOT WOMEN'S HOSP)

## 2011-01-03 LAB — CBC
HCT: 34.6 % — ABNORMAL LOW (ref 36.0–46.0)
Hemoglobin: 11.7 g/dL — ABNORMAL LOW (ref 12.0–15.0)
MCH: 29.7 pg (ref 26.0–34.0)
MCHC: 33.8 g/dL (ref 30.0–36.0)
MCV: 88 fL (ref 78.0–100.0)
MCV: 84.8 fL (ref 78.0–100.0)
Platelets: 290 10*3/uL (ref 150–400)
RDW: 14.4 % (ref 11.5–15.5)
WBC: 3.3 10*3/uL — ABNORMAL LOW (ref 4.0–10.5)

## 2011-01-03 LAB — LIPID PANEL
LDL Cholesterol: 98 mg/dL (ref 0–99)
Total CHOL/HDL Ratio: 4.9 Ratio
VLDL: 39 mg/dL (ref 0–40)

## 2011-01-03 LAB — GC/CHLAMYDIA PROBE AMP, GENITAL: Chlamydia, DNA Probe: NEGATIVE

## 2011-01-03 LAB — URINE MICROSCOPIC-ADD ON

## 2011-01-03 LAB — URINE CULTURE: Culture  Setup Time: 201108030900

## 2011-01-03 LAB — URINALYSIS, ROUTINE W REFLEX MICROSCOPIC
Nitrite: NEGATIVE
Protein, ur: NEGATIVE mg/dL
Specific Gravity, Urine: >1.03 — ABNORMAL HIGH (ref 1.005–1.030)
Urobilinogen, UA: 1 mg/dL (ref 0.0–1.0)

## 2011-01-03 LAB — CONVERTED CEMR LAB
AST: 33 units/L (ref 0–37)
Albumin: 4.3 g/dL (ref 3.5–5.2)
BUN: 12 mg/dL (ref 6–23)
CO2: 21 meq/L (ref 19–32)
Calcium: 9 mg/dL (ref 8.4–10.5)
Chloride: 104 meq/L (ref 96–112)
Cholesterol: 172 mg/dL (ref 0–200)
Creatinine, Ser: 0.98 mg/dL (ref 0.40–1.20)
Glucose, Bld: 101 mg/dL — ABNORMAL HIGH (ref 70–99)
HCT: 39.2 % (ref 36.0–46.0)
HDL: 35 mg/dL — ABNORMAL LOW (ref >39)
Hemoglobin: 12.4 g/dL (ref 12.0–15.0)
Potassium: 3.5 meq/L (ref 3.5–5.3)
RBC: 4.62 M/uL (ref 3.87–5.11)
RDW: 14.4 % (ref 11.5–15.5)
Total CHOL/HDL Ratio: 4.9
WBC: 3.3 10*3/uL — ABNORMAL LOW (ref 4.0–10.5)

## 2011-01-03 LAB — HERPES SIMPLEX VIRUS CULTURE: Culture: NOT DETECTED

## 2011-01-03 LAB — POCT WET PREP (WET MOUNT)

## 2011-01-03 LAB — COMPREHENSIVE METABOLIC PANEL
ALT: 35 U/L (ref 0–35)
AST: 33 U/L (ref 0–37)
Calcium: 9 mg/dL (ref 8.4–10.5)
Chloride: 104 mEq/L (ref 96–112)
Creat: 0.98 mg/dL (ref 0.40–1.20)

## 2011-01-03 LAB — WET PREP, GENITAL
Clue Cells Wet Prep HPF POC: NONE SEEN
Trich, Wet Prep: NONE SEEN

## 2011-01-03 MED ORDER — PANTOPRAZOLE SODIUM 20 MG PO TBEC: 40.0000 mg | DELAYED_RELEASE_TABLET | Freq: Every day | ORAL | Status: DC

## 2011-01-03 MED ORDER — QUETIAPINE FUMARATE 50 MG PO TABS: 50.0000 mg | ORAL_TABLET | Freq: Two times a day (BID) | ORAL | Status: AC

## 2011-01-03 NOTE — Telephone Encounter (Signed)
Reflux medication sent in to pharmacy. Please give pt work note.  Thank you

## 2011-01-03 NOTE — Telephone Encounter (Signed)
Forward to [place]

## 2011-01-03 NOTE — Telephone Encounter (Signed)
Pt checking status of rx for her acid reflux, has not been sent to [facility]. Pt also asking for a note to go back to work so she can get her unemployment from maternity leave.

## 2011-01-03 NOTE — Patient Instructions (Signed)
Hypertriglyceridemia  Diet for High blood levels of Triglycerides Most fats in food are triglycerides. Triglycerides in your blood are stored as fat in your body. High levels of triglycerides in your blood may put you at a greater risk for heart disease and stroke.  Normal triglyceride levels are less than 150 mg/dL. Borderline high levels are 150-199 mg/dl. High levels are 200 - 499 mg/dL, and very high triglyceride levels are greater than 500 mg/dL. The decision to treat high triglycerides is generally based on the level. For people with borderline or high triglyceride levels, treatment includes weight loss and exercise. Drugs are recommended for people with very high triglyceride levels. Many people who need treatment for high triglyceride levels have metabolic syndrome. This syndrome is a collection of disorders that often include: insulin resistance, high blood pressure, blood clotting problems, high cholesterol and triglycerides. TESTING PROCEDURE FOR TRIGLYCERIDES  You should not eat 4 hours before getting your triglycerides measured. The normal range of triglycerides is between 10 and 250 milligrams per deciliter (mg/dl). Some people may have extreme levels (1000 or above), but your triglyceride level may be too high if it is above 150 mg/dl, depending on what other risk factors you have for heart disease.   People with high blood triglycerides may also have high blood cholesterol levels. If you have high blood cholesterol as well as high blood triglycerides, your risk for heart disease is probably greater than if you only had high triglycerides. High blood cholesterol is one of the main risk factors for heart disease.  CHANGING YOUR DIET  Your weight can affect your blood triglyceride level. If you are more than 20% above your ideal body weight, you may be able to lower your blood triglycerides by losing weight. Eating less and exercising regularly is the best way to combat this. Fat provides  more calories than any other food. The best way to lose weight is to eat less fat. Only 30% of your total calories should come from fat. Less than 7% of your diet should come from saturated fat. A diet low in fat and saturated fat is the same as a diet to decrease blood cholesterol. By eating a diet lower in fat, you may lose weight, lower your blood cholesterol, and lower your blood triglyceride level.  Eating a diet low in fat, especially saturated fat, may also help you lower your blood triglyceride level. Ask your dietitian to help you figure how much fat you can eat based on the number of calories your caregiver has prescribed for you.  Exercise, in addition to helping with weight loss may also help lower triglyceride levels.   Alcohol can increase blood triglycerides. You may need to stop drinking alcoholic beverages.   Too much carbohydrate in your diet may also increase your blood triglycerides. Some complex carbohydrates are necessary in your diet. These may include bread, rice, potatoes, other starchy vegetables and cereals.   Reduce "simple" carbohydrates. These may include pure sugars, candy, honey, and jelly without losing other nutrients. If you have the kind of high blood triglycerides that is affected by the amount of carbohydrates in your diet, you will need to eat less sugar and less high-sugar foods. Your caregiver can help you with this.   Adding 2-4 grams of fish oil (EPA+ DHA) may also help lower triglycerides. Speak with your caregiver before adding any supplements to your regimen.  Following the Diet  Maintain your ideal weight. Your caregivers can help you with a diet. Generally,   eating less food and getting more exercise will help you lose weight. Joining a weight control group may also help. Ask your caregivers for a good weight control group in your area.  Eat low-fat foods instead of high-fat foods. This can help you lose weight too.  These foods are lower in fat. Eat MORE  of these:   Dried beans, peas, and lentils.   Egg whites.   Low-fat cottage cheese.   Fish.   Lean cuts of meat, such as round, sirloin, rump, and flank (cut extra fat off meat you fix).   Whole grain breads, cereals and pasta.   Skim and nonfat dry milk.   Low-fat yogurt.   Poultry without the skin.   Cheese made with skim or part-skim milk, such as mozzarella, parmesan, farmers', ricotta, or pot cheese.   These are higher fat foods. Eat LESS of these:   Whole milk and foods made from whole milk, such as American, blue, cheddar, monterey jack, and swiss cheese   High-fat meats, such as luncheon meats, sausages, knockwurst, bratwurst, hot dogs, ribs, corned beef, ground pork, and regular ground beef.   Fried foods.  Limit saturated fats in your diet. Substituting unsaturated fat for saturated fat may decrease your blood triglyceride level. You will need to read package labels to know which products contain saturated fats.  These foods are high in saturated fat. Eat LESS of these:   Fried pork skins.  Whole milk.   Skin and fat from poultry.   Palm oil.   Butter.   Shortening.   Cream cheese.   Berniece Salines.   Margarines and baked goods made from listed oils.   Vegetable shortenings.   Chitterlings.  Fat from meats.   Coconut oil.   Palm kernel oil.   Lard.   Cream.   Sour cream.   Fatback.   Coffee whiteners and non-dairy creamers made with these oils.   Cheese made from whole milk.   Use unsaturated fats (both polyunsaturated and monounsaturated) moderately. Remember, even though unsaturated fats are better than saturated fats; you still want a diet low in total fat.  These foods are high in unsaturated fat:   Canola oil.  Sunflower oil.   Mayonnaise.   Almonds.   Peanuts.   Pine nuts.   Margarines made with these oils.   Safflower oil.  Olive oil.   Avocados.   Cashews.   Peanut butter.   Sunflower seeds.   Soybean  oil.  Peanut oil.   Olives.   Pecans.   Walnuts.   Pumpkin seeds.   Avoid sugar and other high-sugar foods. This will decrease carbohydrates without decreasing other nutrients. Sugar in your food goes rapidly to your blood. When there is excess sugar in your blood, your liver may use it to make more triglycerides. Sugar also contains calories without other important nutrients.  Eat LESS of these:   Sugar, brown sugar, powdered sugar, jam, jelly, preserves, honey, syrup, molasses, pies, candy, cakes, cookies, frosting, pastries, colas, soft drinks, punches, fruit drinks, and regular gelatin.   Avoid alcohol. Alcohol, even more than sugar, may increase blood triglycerides. In addition, alcohol is high in calories and low in nutrients. Ask for sparkling water, or a diet soft drink instead of an alcoholic beverage.  Suggestions for planning and preparing meals   Bake, broil, grill or roast meats instead of frying.   Remove fat from meats and skin from poultry before cooking.   Add spices, herbs, lemon juice  or vinegar to vegetables instead of salt, rich sauces or gravies.   Use a non-stick skillet without fat or use no-stick sprays.   Cool and refrigerate stews and broth. Then remove the hardened fat floating on the surface before serving.   Refrigerate meat drippings and skim off fat to make low-fat gravies.   Serve more fish.   Use less butter, margarine and other high-fat spreads on bread or vegetables.   Use skim or reconstituted non-fat dry milk for cooking.   Cook with low-fat cheeses.   Substitute low-fat yogurt or cottage cheese for all or part of the sour cream in recipes for sauces, dips or congealed salads.   Use half yogurt/half mayonnaise in salad recipes.   Substitute evaporated skim milk for cream. Evaporated skim milk or reconstituted non-fat dry milk can be whipped and substituted for whipped cream in certain recipes.   Choose fresh fruits for dessert instead  of high-fat foods such as pies or cakes. Fruits are naturally low in fat.  When Dining Out   Order low-fat appetizers such as fruit or vegetable juice, pasta with vegetables or tomato sauce.   Select clear, rather than cream soups.   Ask that dressings and gravies be served on the side. Then use less of them.   Order foods that are baked, broiled, poached, steamed, stir-fried, or roasted.   Ask for margarine instead of butter, and use only a small amount.   Drink sparkling water, unsweetened tea or coffee, or diet soft drinks instead of alcohol or other sweet beverages.  QUESTIONS AND ANSWERS ABOUT OTHER FATS IN THE BLOOD:  SATURATED FAT, TRANS FAT, AND CHOLESTEROL What is trans fat? Trans fat is a type of fat that is formed when vegetable oil is hardened through a process called hydrogenation. This process helps makes foods more solid, gives them shape, and prolongs their shelf life. Trans fats are also called hydrogenated or partially hydrogenated oils.  What do saturated fat, trans fat, and cholesterol in foods have to do with heart disease? Saturated fat, trans fat, and cholesterol in the diet all raise the level of LDL "bad" cholesterol in the blood. The higher the LDL cholesterol, the greater the risk for coronary heart disease (CHD). Saturated fat and trans fat raise LDL similarly.  What foods contain saturated fat, trans fat, and cholesterol? High amounts of saturated fat are found in animal products, such as fatty cuts of meat, chicken skin, and full-fat dairy products like butter, whole milk, cream, and cheese, and in tropical vegetable oils such as palm, palm kernel, and coconut oil. Trans fat is found in some of the same foods as saturated fat, such as vegetable shortening, some margarines (especially hard or stick margarine), crackers, cookies, baked goods, fried foods, salad dressings, and other processed foods made with partially hydrogenated vegetable oils. Small amounts of trans  fat also occur naturally in some animal products, such as milk products, beef, and lamb. Foods high in cholesterol include liver, other organ meats, egg yolks, shrimp, and full-fat dairy products. How can I use the new food label to make heart-healthy food choices? Check the Nutrition Facts panel of the food label. Choose foods lower in saturated fat, trans fat, and cholesterol. For saturated fat and cholesterol, you can also use the Percent Daily Value (%DV): 5% DV or less is low, and 20% DV or more is high. (There is no %DV for trans fat.) Use the Nutrition Facts panel to choose foods low in saturated fat   and cholesterol, and if the trans fat is not listed, read the ingredients and limit products that list shortening or hydrogenated or partially hydrogenated vegetable oil, which tend to be high in trans fat. POINTS TO REMEMBER: YOU NEED A LITTLE TLC (THERAPEUTIC LIFESTYLE CHANGES)  Discuss your risk for heart disease with your caregivers, and take steps to reduce risk factors.   Change your diet. Choose foods that are low in saturated fat, trans fat, and cholesterol.   Add exercise to your daily routine if it is not already being done. Participate in physical activity of moderate intensity, like brisk walking, for at least 30 minutes on most, and preferably all days of the week. No time? Break the 30 minutes into three, 10-minute segments during the day.   Stop smoking. If you do smoke, contact your caregiver to discuss ways in which they can help you quit.   Do not use street drugs.   Maintain a normal weight.   Maintain a healthy blood pressure.   Keep up with your blood work for checking the fats in your blood as directed by your caregiver.  Document Released: 07/24/2004 Document Re-Released: 03/26/2010 [facility] Patient Information 2011 [place].Hypertension (High Blood Pressure) As your heart beats, it forces blood through your arteries. This force is your blood pressure. If the  pressure is too high, it is called hypertension (HTN) or high blood pressure. HTN is dangerous because you may have it and not know it. High blood pressure may mean that your heart has to work harder to pump blood. Your arteries may be narrow or stiff. The extra work puts you at risk for heart disease, stroke, and other problems.  Blood pressure consists of two numbers, a higher number over a lower, 110/72, for example. It is stated as "110 over 72." The ideal is below 120 for the top number (systolic) and under 80 for the bottom (diastolic). Write down your blood pressure today. You should pay close attention to your blood pressure if you have certain conditions such as:  Heart failure.  Prior heart attack.   Diabetes   Chronic kidney disease.   Prior stroke.   Multiple risk factors for heart disease.   To see if you have HTN, your blood pressure should be measured while you are seated with your arm held at the level of the heart. It should be measured at least twice. A one-time elevated blood pressure reading (especially in the Emergency Department) does not mean that you need treatment. There may be conditions in which the blood pressure is different between your right and left arms. It is important to see your caregiver soon for a recheck. Most people have essential hypertension which means that there is not a specific cause. This type of high blood pressure may be lowered by changing lifestyle factors such as:  Stress.  Smoking.   Lack of exercise.   Excessive weight.  Drug/tobacco/alcohol use.   Eating less salt.   Most people do not have symptoms from high blood pressure until it has caused damage to the body. Effective treatment can often prevent, delay or reduce that damage. TREATMENT Treatment for high blood pressure, when a cause has been identified, is directed at the cause. There are a large number of medications to treat HTN. These fall into several categories, and your  caregiver will help you select the medicines that are best for you. Medications may have side effects. You should review side effects with your caregiver. If your  blood pressure stays high after you have made lifestyle changes or started on medicines,   Your medication(s) may need to be changed.   Other problems may need to be addressed.   Be certain you understand your prescriptions, and know how and when to take your medicine.   Be sure to follow up with your caregiver within the time frame advised (usually within two weeks) to have your blood pressure rechecked and to review your medications.   If you are taking more than one medicine to lower your blood pressure, make sure you know how and at what times they should be taken. Taking two medicines at the same time can result in blood pressure that is too low.  SEEK IMMEDIATE MEDICAL CARE IF YOU DEVELOP:  A severe headache, blurred or changing vision, or confusion.   Unusual weakness or numbness, or a faint feeling.   Severe chest or abdominal pain, vomiting, or breathing problems.  MAKE SURE YOU:   Understand these instructions.   Will watch your condition.   Will get help right away if you are not doing well or get worse.  Document Released: 10/06/2005 Document Re-Released: 03/26/2010 [facility] Patient Information 2011 [place], [place].Manic Depressive Illness  (Bipolar Disorder) Bipolar disorder is also known as manic depressive illness. It is when the brain does not function properly and causes shifts in a person's moods, energy and ability to function in everyday life. These shifts are different from the normal ups and downs that everyone experiences. Instead the shifts are severe. If this goes untreated, the person's life becomes more and more disorderly. People with this disorder can be treated can lead full and productive lives. This disorder must be managed throughout life.  SYMPTOMS  Bipolar disorder causes dramatic mood  swings. These mood swings go in cycles. They cycle from extreme "highs" and irritable to deep "lows" of sadness and hopelessness.   Between the extreme moods, there are usually periods of normal mood.   Along with the mood shifts, the person will have severe changes in energy and behavior. The periods of "highs" and "lows" are called episodes of mania and depression.  Signs of mania:  Lots of energy, activity and restlessness.  Extreme "high" or good mood.   Extreme irritability.   Racing thoughts and talking very fast.   Jumping from one idea to another.   Not able to focus, easily distracted.   Little need to sleep.   Grand beliefs in one's abilities and powers.   Spending sprees.   Increased sexual drive. This can result in many sexual partners.  Poor judgment.   Abuse of drugs, particularly cocaine, alcohol, and sleeping medication.   Aggressive or provocative behavior.   A lasting period of behavior that is different from usual.   Denial that anything is wrong.   *A manic episode is identified if a "high" mood happens with three or more of the other symptoms lasting most of the day, nearly everyday for a week or longer. If the mood is more irritable in nature, four additional symptoms must be present. Signs of depression:  Lasting feelings of sadness, anxiety or empty mood.  Feelings of hopelessness with negative thoughts.   Feelings of guilt, worthlessness, or helplessness.   Loss of interest or pleasure in activities once enjoyed, including sex.   Feelings of fatigue or having less energy.   Trouble focusing, making decisions, remembering.   Feeling restless or irritable.  Sleeping too little or too much.   Change  in eating with possible weight gain or loss.   Feeling ongoing pain that is not caused by physical illness or injury.   Thoughts of death or suicide or suicide attempts.   *A depressive episode is identified as having five or more of the  above symptoms that last most of the day, nearly everyday for two weeks or longer. CAUSES  Research shows that there is no single cause for the disorder. Many factors act together to produce the illness.   This can be passed down from family (hereditary).   Environment may play a part.  TREATMENT  Long-term treatment is strongly recommended because bipolar disorder is a repeated illness. This disorder is better controlled if treatment is ongoing than if it is off and on.   A combination of medication and talk therapy is best for managing the disorder over time.   Medication.   Medication can be prescribed by a doctor that is an expert in treating mental disorders (psychiatrists). Medications known as "mood stabilizers" are usually prescribed to help control the illness. Other medications can be added when needed. These medicines usually treat episodes of mania or depression that break through despite the mood stabilizer.   Talk Therapy.   Along with medication, some forms of talk therapy are helpful in providing support, education and guidance to people with the illness and their families. Studies show that this type of treatment increases mood stability, decreases need for hospitalization and improves how they function society.   Electroconvulsive Therapy (ECT).   In extreme situations where the above treatments do not work or work too slowly to relieve severe symptoms, ECT may be considered.  Document Released: 01/12/2001 Document Re-Released: 01/13/2008 [facility] Patient Information 2011 [place], [place].

## 2011-01-04 LAB — URINALYSIS, ROUTINE W REFLEX MICROSCOPIC
Ketones, ur: 15 mg/dL — AB
Nitrite: NEGATIVE
Urobilinogen, UA: 2 mg/dL — ABNORMAL HIGH (ref 0.0–1.0)
pH: 6 (ref 5.0–8.0)

## 2011-01-04 LAB — WET PREP, GENITAL: Yeast Wet Prep HPF POC: NONE SEEN

## 2011-01-04 NOTE — Progress Notes (Signed)
  Subjective:    Patient ID: Makayla Vasquez, female    DOB: [birth year], 30 y.o.   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   Pt had LTCS on 11/27/10 for nonreassuring fetal heart tones with otherwise normal postpartum course apart from noted SBPs in the 150s prior to discharge with the pt being placed on HCTZ prior to discharge. Pt denies any nausea, vomiting, abdominal pain. No headaches or vision changes per pt. No CP.  Appetite has been stable per pt. UOP and BMs at baseline. No fevers. Lochia has resolved. Has adequate resources at home to help with baby including family. FOB has been somewhat involved in overall care of baby this far.  HTN: Pt has been compliant with HCTZ. Has not been checking blood pressures at home. Above ROS otherwise negative.    Mood: Pt denies any HI/SI. [place] feelings [place] per pt.  No depressed mood per pt. Pt does report decreased sleep, as well as intermittent pressured speech and racing thoughts. Has not been on mood stabilizer in setting of bipolar disorder for >1 year per pt. Medication was discontinued prior to pregnancy.   Heart Block: Pt had follow up with Cardiologist 1-2 weeks ago with no major changes per pt. Pacemaker function was otherwise stable per pt report.    Review of Systems See HPI     Objective:   Physical Exam Gen: up in bed, NAD, obese appearing HEENT: large neck girth, no goiter, otherwise normal CV:RRR, no murmurs PULM: CTAB, no wheezes ABD: obese abdomen, non tenderness, LTCS incision site well healed GU: Normal external genitalia, + vaginal discharge EXT: 2+ peripheral pulses,          Assessment & Plan:  30 YO G1P1 female here for post partum visit: Annual Screening; Will check FLP and CMET given obesity.  HTN: Check K and Cr. Will refilll HCTZ.    Bipolar Disorder: Will start on seroquel as pt has seemingly  predominant manic symptoms. Will follow up on mood in 2 weeks.   Birth Control: Received Depo prior to hospital discharge. Has medicaid. Will do mirena at next clinical visit.   Heart Block: Stable. Asymptomatic. Will defer to cardiology  Vaginal Discharge: + vaginal discharge on speculum exam. Will check wet prep given history. No CMT.

## 2011-01-05 DIAGNOSIS — I10 Essential (primary) hypertension: Secondary | ICD-10-CM | POA: Insufficient documentation

## 2011-01-05 DIAGNOSIS — N898 Other specified noninflammatory disorders of vagina: Secondary | ICD-10-CM | POA: Insufficient documentation

## 2011-01-05 LAB — WET PREP, GENITAL

## 2011-01-05 LAB — URINALYSIS, ROUTINE W REFLEX MICROSCOPIC
Bilirubin Urine: NEGATIVE
Ketones, ur: NEGATIVE mg/dL
Nitrite: NEGATIVE
Protein, ur: NEGATIVE mg/dL
pH: 6.5 (ref 5.0–8.0)

## 2011-01-05 LAB — CBC
HCT: 37.5 % (ref 36.0–46.0)
Hemoglobin: 12.7 g/dL (ref 12.0–15.0)
MCV: 86.9 fL (ref 78.0–100.0)
Platelets: 186 10*3/uL (ref 150–400)
RBC: 4.31 MIL/uL (ref 3.87–5.11)
WBC: 6 10*3/uL (ref 4.0–10.5)

## 2011-01-05 LAB — GC/CHLAMYDIA PROBE AMP, GENITAL
Chlamydia, DNA Probe: NEGATIVE
GC Probe Amp, Genital: NEGATIVE

## 2011-01-05 LAB — URINE MICROSCOPIC-ADD ON

## 2011-01-05 MED ORDER — HYDROCHLOROTHIAZIDE 12.5 MG PO TABS: 25.0000 mg | ORAL_TABLET | Freq: Every day | ORAL | Status: DC

## 2011-01-05 NOTE — Assessment & Plan Note (Addendum)
Annual Screening; Will check FLP and CMET given obesity.  HTN: WIll check Cr and K. Will refill HCTZ.    Bipolar Disorder: Will start on seroquel as pt has seemingly predominant manic symptoms. Will follow up on mood in 2 weeks.   Birth Control: Received Depo prior to hospital discharge. Has medicaid. Will do mirena at next clinical visit.   Heart Block: Stable. Asymptomatic. Will defer to cardiology  Vaginal Discharge: + vaginal discharge on speculum exam. Will check wet prep given history. No CMT.

## 2011-01-05 NOTE — Assessment & Plan Note (Signed)
Will start on seroquel as pt has seemingly predominant manic symptoms. Will follow up on mood in 2 weeks.

## 2011-01-05 NOTE — Assessment & Plan Note (Signed)
Stable. Check K and Cr. Will refilll HCTZ

## 2011-01-05 NOTE — Assessment & Plan Note (Signed)
+   vaginal discharge on spec exam. Will perform wet prep.

## 2011-01-06 ENCOUNTER — Encounter: Payer: Self-pay | Admitting: Nurse Practitioner

## 2011-01-06 ENCOUNTER — Ambulatory Visit (INDEPENDENT_AMBULATORY_CARE_PROVIDER_SITE_OTHER): Payer: Medicare Other | Admitting: Nurse Practitioner

## 2011-01-06 ENCOUNTER — Encounter: Payer: Self-pay | Admitting: *Deleted

## 2011-01-06 DIAGNOSIS — Z950 Presence of cardiac pacemaker: Secondary | ICD-10-CM

## 2011-01-06 DIAGNOSIS — R002 Palpitations: Secondary | ICD-10-CM

## 2011-01-06 DIAGNOSIS — Q246 Congenital heart block: Secondary | ICD-10-CM

## 2011-01-06 DIAGNOSIS — K219 Gastro-esophageal reflux disease without esophagitis: Secondary | ICD-10-CM | POA: Insufficient documentation

## 2011-01-06 DIAGNOSIS — E669 Obesity, unspecified: Secondary | ICD-10-CM

## 2011-01-06 DIAGNOSIS — F319 Bipolar disorder, unspecified: Secondary | ICD-10-CM

## 2011-01-06 LAB — GLUCOSE, CAPILLARY: Glucose-Capillary: 123 mg/dL — ABNORMAL HIGH (ref 70–99)

## 2011-01-06 LAB — URINALYSIS, ROUTINE W REFLEX MICROSCOPIC
Glucose, UA: NEGATIVE mg/dL
Ketones, ur: 15 mg/dL — AB
Nitrite: NEGATIVE
Specific Gravity, Urine: 1.013 (ref 1.005–1.030)
pH: 7 (ref 5.0–8.0)

## 2011-01-06 LAB — STREP A DNA PROBE: Group A Strep Probe: NEGATIVE

## 2011-01-06 LAB — URINE CULTURE: Colony Count: >=100000

## 2011-01-06 LAB — URINE MICROSCOPIC-ADD ON

## 2011-01-06 LAB — POCT PREGNANCY, URINE: Preg Test, Ur: POSITIVE

## 2011-01-06 NOTE — Telephone Encounter (Signed)
Called patient and informed Rx sent to pharmacy and will leave work note at front desk.Busick, Rodena Medin

## 2011-01-07 NOTE — Cardiovascular Report (Signed)
Summary: Office Visit   Office Visit   Imported By: Roderic Ovens 01/01/2011 11:55:07  _____________________________________________________________________  External Attachment:    Type:   Image     Comment:   External Document

## 2011-01-13 ENCOUNTER — Encounter: Payer: Self-pay | Admitting: Family Medicine

## 2011-01-13 ENCOUNTER — Ambulatory Visit (INDEPENDENT_AMBULATORY_CARE_PROVIDER_SITE_OTHER): Payer: Medicare Other | Admitting: Family Medicine

## 2011-01-13 VITALS — BP 120/70 | HR 72 | Wt 223.8 lb

## 2011-01-13 DIAGNOSIS — F319 Bipolar disorder, unspecified: Secondary | ICD-10-CM

## 2011-01-13 MED ORDER — LAMOTRIGINE 25 MG PO TABS: ORAL_TABLET | ORAL | Status: DC

## 2011-01-13 NOTE — Patient Instructions (Addendum)
STOP the seroquel  I am switching you to lamictal as medicaid is not covering your seroquel.  Please follow up with me in 2 weeks to see how your mood is doing If you have any thoughts of hurting yourself or your baby, please give [place] a call or go to the ED Otherwise, call with any questions,  God Bless Makayla Albee MD

## 2011-01-14 NOTE — Progress Notes (Signed)
  Subjective:    Patient ID: Makayla Vasquez, female    DOB: 1981/04/18, 30 y.o.   MRN: [id number]  HPI Pt w/ baseline hx/o bipolar disorder. Was started on seroquel 2 weeks ago at postpartum visit  in setting of pt with some manic sxs including insomnia and racing thoughts. No HI/SI. No thoughts of harming child. Pt has taken seroquel with some subjective improvement in sxs, however, pt notified that medicaid would not continue to cover seroquel. Pt states that she has taken lithium and lamictal in the past without any major complications. Pt is not breast feeding.    Review of Systems See HPI     Objective:   Physical Exam Gen: Alert, NAD CV: RRR, no murmur PULM: CTAB    Assessment & Plan:  Mood- Will transition pt to lamictal as insurance will not cover seroquel. Pt has tolerated this in the past with some improvement in mood sxs. Lamictal also on [employer] 4 dollar list. Baseline labs including CBC and CMET drawn at last visit. Will follow up on mood in 2 weeks to assess for any improvement. Discussed psych red flags at length. Will likely re-refer back to psych vs. MDO in house once pt on stable medication regimen.

## 2011-01-14 NOTE — Assessment & Plan Note (Signed)
Will transition pt to lamictal as insurance will not cover seroquel. Pt has tolerated this in the past with some improvement in mood sxs. Lamictal also on [employer] 4 dollar list. Baseline labs including CBC and CMET drawn at last visit. Will follow up on mood in 2 weeks to assess for any improvement. Discussed psych red flags at length. Will likely re-refer back to psych vs. MDO in house once pt on stable medication regimen.

## 2011-01-21 LAB — POCT PREGNANCY, URINE: Preg Test, Ur: NEGATIVE

## 2011-01-23 LAB — DIFFERENTIAL
Lymphocytes Relative: 42 % (ref 12–46)
Lymphs Abs: 1.8 10*3/uL (ref 0.7–4.0)
Monocytes Relative: 10 % (ref 3–12)
Neutro Abs: 2.1 10*3/uL (ref 1.7–7.7)
Neutrophils Relative %: 47 % (ref 43–77)

## 2011-01-23 LAB — URINALYSIS, ROUTINE W REFLEX MICROSCOPIC
Glucose, UA: NEGATIVE mg/dL
Hgb urine dipstick: NEGATIVE
Protein, ur: NEGATIVE mg/dL
Specific Gravity, Urine: 1.029 (ref 1.005–1.030)
Urobilinogen, UA: 1 mg/dL (ref 0.0–1.0)

## 2011-01-23 LAB — COMPREHENSIVE METABOLIC PANEL
CO2: 28 mEq/L (ref 19–32)
Calcium: 9.2 mg/dL (ref 8.4–10.5)
Creatinine, Ser: 0.8 mg/dL (ref 0.4–1.2)
GFR calc non Af Amer: >60 mL/min (ref >60)
Glucose, Bld: 82 mg/dL (ref 70–99)
Total Protein: 8.1 g/dL (ref 6.0–8.3)

## 2011-01-23 LAB — CBC
Hemoglobin: 12.8 g/dL (ref 12.0–15.0)
MCHC: 33.5 g/dL (ref 30.0–36.0)
MCV: 85.3 fL (ref 78.0–100.0)
RBC: 4.47 MIL/uL (ref 3.87–5.11)
RDW: 14.7 % (ref 11.5–15.5)

## 2011-01-23 LAB — GC/CHLAMYDIA PROBE AMP, GENITAL
Chlamydia, DNA Probe: NEGATIVE
GC Probe Amp, Genital: NEGATIVE

## 2011-01-23 LAB — WET PREP, GENITAL: Trich, Wet Prep: NONE SEEN

## 2011-01-23 LAB — LIPASE, BLOOD: Lipase: 26 U/L (ref 11–59)

## 2011-01-27 ENCOUNTER — Ambulatory Visit (HOSPITAL_BASED_OUTPATIENT_CLINIC_OR_DEPARTMENT_OTHER): Admission: RE | Admit: 2011-01-27 | Payer: Medicare Other | Source: Ambulatory Visit | Admitting: Otolaryngology

## 2011-01-27 LAB — URINE MICROSCOPIC-ADD ON

## 2011-01-27 LAB — URINALYSIS, ROUTINE W REFLEX MICROSCOPIC
Nitrite: NEGATIVE
Specific Gravity, Urine: 1.025 (ref 1.005–1.030)
Urobilinogen, UA: 2 mg/dL — ABNORMAL HIGH (ref 0.0–1.0)

## 2011-01-27 LAB — POCT PREGNANCY, URINE: Preg Test, Ur: NEGATIVE

## 2011-01-29 ENCOUNTER — Encounter: Payer: Self-pay | Admitting: Family Medicine

## 2011-01-29 ENCOUNTER — Ambulatory Visit (INDEPENDENT_AMBULATORY_CARE_PROVIDER_SITE_OTHER): Payer: Medicare Other | Admitting: Family Medicine

## 2011-01-29 ENCOUNTER — Emergency Department (HOSPITAL_COMMUNITY)
Admission: EM | Admit: 2011-01-29 | Discharge: 2011-01-30 | Disposition: A | Discharge status: Home / Self Care | Payer: Medicare Other | Attending: Emergency Medicine | Admitting: Emergency Medicine

## 2011-01-29 DIAGNOSIS — F319 Bipolar disorder, unspecified: Secondary | ICD-10-CM

## 2011-01-29 DIAGNOSIS — Z309 Encounter for contraceptive management, unspecified: Secondary | ICD-10-CM

## 2011-01-29 DIAGNOSIS — Z950 Presence of cardiac pacemaker: Secondary | ICD-10-CM | POA: Insufficient documentation

## 2011-01-29 DIAGNOSIS — L02219 Cutaneous abscess of trunk, unspecified: Secondary | ICD-10-CM | POA: Insufficient documentation

## 2011-01-29 DIAGNOSIS — J45909 Unspecified asthma, uncomplicated: Secondary | ICD-10-CM

## 2011-01-29 DIAGNOSIS — IMO0001 Reserved for inherently not codable concepts without codable children: Secondary | ICD-10-CM

## 2011-01-29 DIAGNOSIS — O99893 Other specified diseases and conditions complicating puerperium: Secondary | ICD-10-CM | POA: Insufficient documentation

## 2011-01-29 MED ORDER — LAMOTRIGINE 100 MG PO TABS: ORAL_TABLET | ORAL | Status: DC

## 2011-01-29 NOTE — Assessment & Plan Note (Signed)
Much improved with lamictal. PHQ-9 score 0. Will continue with up titration. Likely goal dose of 100-200mg  daily. Will follow up in 3 weeks

## 2011-01-29 NOTE — Assessment & Plan Note (Signed)
IUD at follow up appt

## 2011-01-29 NOTE — Progress Notes (Signed)
  Subjective:    Patient ID: Makayla Vasquez, female    DOB: 02-Feb-1981, 30 y.o.   MRN: [id number]  HPI Pt here for follow up on mood. Mood: Pt was recently started on lamictal 01/13/11  in setting of symptomatic baseline bipolar disorder. Was initially tried on seroquel with improvement in sxs, however medicaid would not cover medication. Currently on lamictal 50 mg daily.  Pt states that mood is overall much improved. No HI/SI. No longer with depressed sxs per pt. Week 2 of medication.   Birth Control: Pt postpartum approx 8-9 weeks. Received depo shot at hospital discharge. Is desiring IUD. Has medicaid.   Asthma- has baseline albuterol inhaler that pt uses intermittently. No wheezing, increased WOB, dyspnea per pt. Uses albuterol prn usually 1-2 times per month.   Review of Systems See HPI    Objective:   Physical Exam Gen: up in chair, NAD, obese  CV: RRR, no murmurs auscultated PULM: CTAB, no wheezes, rales, rhoncii ABD: obese, nontender, + bowel sounds  EXT: 2+ peripheral pulses    Assessment & Plan:  Bipolar Disorder- Much improved with lamictal. PHQ-9 score 0. Will continue with up titration. Likely goal dose of 100-200mg  daily. Will follow up in 3 weeks Asthma- Will set up for formal PFTs. Mild intermittent asthma currently.  Birth Control- IUD at follow up appt

## 2011-01-29 NOTE — Assessment & Plan Note (Signed)
Will set up for formal PFTs. Mild intermittent asthma currently.

## 2011-01-29 NOTE — Patient Instructions (Signed)
Manic Depressive Illness   (Bipolar Disorder)   Bipolar disorder is also known as manic depressive illness. It is when the brain does not function properly and causes shifts in a person's moods, energy and ability to function in everyday life. These shifts are different from the normal ups and downs that everyone experiences. Instead the shifts are severe. If this goes untreated, the person's life becomes more and more disorderly. People with this disorder can be treated can lead full and productive lives. This disorder must be managed throughout life.   SYMPTOMS   Bipolar disorder causes dramatic mood swings. These mood swings go in cycles. They cycle from extreme “highs” and irritable to deep “lows” of sadness and hopelessness.   Between the extreme moods, there are usually periods of normal mood.   Along with the mood shifts, the person will have severe changes in energy and behavior. The periods of "highs" and "lows" are called episodes of mania and depression.   Signs of mania:   Lots of energy, activity and restlessness.   Extreme “high” or good mood.   Extreme irritability.   Racing thoughts and talking very fast.   Jumping from one idea to another.   Not able to focus, easily distracted.   Little need to sleep.   Grand beliefs in one's abilities and powers.   Spending sprees.  Increased sexual drive. This can result in many sexual partners.   Poor judgment.   Abuse of drugs, particularly cocaine, alcohol, and sleeping medication.   Aggressive or provocative behavior.   A lasting period of behavior that is different from usual.   Denial that anything is wrong.    *A manic episode is identified if a “high” mood happens with three or more of the other symptoms lasting most of the day, nearly everyday for a week or longer. If the mood is more irritable in nature, four additional symptoms must be present.   Signs of depression:   Lasting feelings of sadness, anxiety or empty mood.   Feelings of hopelessness with  negative thoughts.   Feelings of guilt, worthlessness, or helplessness.   Loss of interest or pleasure in activities once enjoyed, including sex.   Feelings of fatigue or having less energy.   Trouble focusing, making decisions, remembering.  Feeling restless or irritable.   Sleeping too little or too much.   Change in eating with possible weight gain or loss.   Feeling ongoing pain that is not caused by physical illness or injury.   Thoughts of death or suicide or suicide attempts.    *A depressive episode is identified as having five or more of the above symptoms that last most of the day, nearly everyday for two weeks or longer.   CAUSES   Research shows that there is no single cause for the disorder. Many factors act together to produce the illness.   This can be passed down from family (hereditary).   Environment may play a part.   TREATMENT   Long-term treatment is strongly recommended because bipolar disorder is a repeated illness. This disorder is better controlled if treatment is ongoing than if it is off and on.   A combination of medication and talk therapy is best for managing the disorder over time.   Medication.   Medication can be prescribed by a doctor that is an expert in treating mental disorders (psychiatrists). Medications known as “mood stabilizers” are usually prescribed to help control the illness. Other medications can be added   when needed. These medicines usually treat episodes of mania or depression that break through despite the mood stabilizer.   Talk Therapy.   Along with medication, some forms of talk therapy are helpful in providing support, education and guidance to people with the illness and their families. Studies show that this type of treatment increases mood stability, decreases need for hospitalization and improves how they function society.   Electroconvulsive Therapy (ECT).   In extreme situations where the above treatments do not work or work too slowly to relieve severe  symptoms, ECT may be considered.   Document Released: 01/12/2001 Document Re-Released: 01/13/2008   [employer]® Patient Information ©2011 [employer].

## 2011-01-30 LAB — POCT RAPID STREP A (OFFICE): Streptococcus, Group A Screen (Direct): NEGATIVE

## 2011-02-04 ENCOUNTER — Encounter: Payer: Self-pay | Admitting: Pharmacist

## 2011-02-04 ENCOUNTER — Ambulatory Visit (INDEPENDENT_AMBULATORY_CARE_PROVIDER_SITE_OTHER): Payer: Medicare Other | Admitting: Pharmacist

## 2011-02-04 DIAGNOSIS — J45909 Unspecified asthma, uncomplicated: Secondary | ICD-10-CM

## 2011-02-04 DIAGNOSIS — J309 Allergic rhinitis, unspecified: Secondary | ICD-10-CM

## 2011-02-04 MED ORDER — ALBUTEROL SULFATE (2.5 MG/3ML) 0.083% IN NEBU
2.5000 mg | INHALATION_SOLUTION | Freq: Once | RESPIRATORY_TRACT | Status: AC
  Administered 2011-02-04: 2.5 mg via RESPIRATORY_TRACT

## 2011-02-04 MED ORDER — ALBUTEROL SULFATE HFA 108 (90 BASE) MCG/ACT IN AERS: 2.0000 | INHALATION_SPRAY | RESPIRATORY_TRACT | Status: DC | PRN

## 2011-02-04 NOTE — Assessment & Plan Note (Addendum)
A: Spirometry WNL, no significant difference pre- and post-albuterol treatment, occasional SOB not requiring treatment.  P: Continue albuterol as needed.  Duration of encounter 45 mins Seen with Clance Boll PharmD, Ivery Quale PharmD Candidate, [employer] MD student.

## 2011-02-04 NOTE — Patient Instructions (Addendum)
1) Continue albulterol inhaler as needed for shortness of breath 2) Follow up with Dr. Alvester Morin as planned

## 2011-02-04 NOTE — Progress Notes (Signed)
  Subjective:    Patient ID: Makayla Vasquez, female    DOB: 02-24-1981, 30 y.o.   MRN: [id number]  HPI Patient arrives in good spirits. She denies use of albuterol since giving birth (6-7 months ago). She reports only requiring albuterol intermittently during her pregnancy. She reports occasional shortness of breath, usually triggered by summer heat, which self-resolves.   Review of Systems     Objective:   Physical Exam        Assessment & Plan:

## 2011-02-05 NOTE — Progress Notes (Signed)
  Subjective:    Patient ID: Makayla Vasquez, female    DOB: [birth year], 30 y.o.   MRN: [id number]  HPI Reviewed and agree with Dr. Raymondo Band.   Review of Systems     Objective:   Physical Exam        Assessment & Plan:

## 2011-02-28 ENCOUNTER — Encounter: Payer: Self-pay | Admitting: Family Medicine

## 2011-02-28 ENCOUNTER — Ambulatory Visit (INDEPENDENT_AMBULATORY_CARE_PROVIDER_SITE_OTHER): Payer: Medicare Other | Admitting: Family Medicine

## 2011-02-28 DIAGNOSIS — Z309 Encounter for contraceptive management, unspecified: Secondary | ICD-10-CM

## 2011-02-28 MED ORDER — LEVONORGESTREL 20 MCG/24HR IU IUD
INTRAUTERINE_SYSTEM | Freq: Once | INTRAUTERINE | Status: AC
  Administered 2011-02-28: 1 via INTRAUTERINE

## 2011-02-28 MED ORDER — LEVONORGESTREL 20 MCG/24HR IU IUD
1.0000 | INTRAUTERINE_SYSTEM | Freq: Once | INTRAUTERINE | Status: DC
Start: 2011-02-28 — End: 2011-02-28

## 2011-02-28 NOTE — Patient Instructions (Signed)
IMPORTANT: HOW TO USE THIS INFORMATION:  This is a summary and does NOT have all possible information about this product. This information does not assure that this product is safe, effective, or appropriate for you. This information is not individual medical advice and does not substitute for the advice of your health care professional. Always ask your health care professional for complete information about this product and your specific health needs.    LEVONORGESTREL-RELEASING IMPLANT - INTRAUTERINE (lee-voh-nor-JEST-rell)    COMMON BRAND NAME(S): Mirena    USES:  This product is a small, flexible device that is placed in the womb (uterus) to prevent pregnancy. It is used in women who desire reversible birth control that works for a long time (up to 5 years). The device works by slowly releasing a hormone (levonorgestrel) that is similar to a certain substance made by a woman's body. This product is only intended for women who have previously given birth and have only one sexual partner. It is not meant for women with a history of certain infections/conditions (e.g., pelvic inflammatory disease, sexually transmitted disease, a certain problem pregnancy called ectopic pregnancy). For more information, consult your doctor. The use of this medication device does not protect you or your partner against sexually transmitted diseases (e.g., HIV, gonorrhea). Carefully read all of the information provided by your doctor, and ask any questions you may have about this product or other birth control methods that may be right for you.    HOW TO USE:  Read the Patient Information Leaflet provided by your pharmacist before this medication device is inserted and each time it is re-inserted. The leaflet contains very important information about side effects and when it is important to call your doctor. If you have any questions, consult your doctor or pharmacist. This product is inserted into your uterus by a properly  trained health care professional, usually once every 5 years or as determined by your doctor. The medication in the device is slowly released into the body over a 5-year period. Have a follow-up appointment 4-12 weeks after insertion of this product to check that it is still correctly in place. If you still desire birth control after 5 years, the medication device may be replaced with a new one. The medication device may also be removed at any time by a properly trained health care professional. Learn all the instructions on how and when to check this product and its proper positioning in your body, and make sure you understand the problems that may occur with this product. See also Precautions section.    SIDE EFFECTS:  Irregular vaginal bleeding (e.g., spotting), cramps, headache, nausea, breast pain, acne, rash, hair loss, weight gain, or decreased interest in sex may occur. If any of these effects persist or worsen, tell your doctor promptly. Remember that your doctor has prescribed this medication device because he or she has judged that the benefit to you is greater than the risk of side effects. Many people using this medication device do not have serious side effects. Tell your doctor immediately if any of these serious side effects occur: lack of menstrual period, unexplained fever, chills, trouble breathing, mental/mood changes (e.g., depression, nervousness), vaginal swelling/itching, painful intercourse. Tell your doctor immediately if any of these unlikely but serious side effects occur: migraine/severe headache, vomiting, tiredness, fast/pounding heartbeat. Tell your doctor immediately if any of these highly unlikely but very serious side effects occur: prolonged or heavy vaginal bleeding, unusual vaginal discharge/odor, vaginal sores, abdominal/pelvic pain or  tenderness, lumps in the breast, yellowing eyes/skin, dark urine, persistent nausea, trouble urinating. A very serious allergic reaction to  this drug is rare. However, seek immediate medical attention if you notice any of the following symptoms of a serious allergic reaction: rash, itching/swelling (especially of the face/tongue/throat), severe dizziness, trouble breathing. This is not a complete list of possible side effects. If you notice other effects not listed above, contact your doctor or pharmacist. In the [place] - Call your doctor for medical advice about side effects. You may report side effects to FDA at 1-800-FDA-1088. In [place] - Call your doctor for medical advice about side effects. You may report side effects to [facility] [place] at [phone].    PRECAUTIONS:  Before using this medication device, tell your doctor or pharmacist if you are allergic to levonorgestrel, or to any other progestins (e.g., norethindrone, desogestrel); or if you have any other allergies. This product may contain inactive ingredients, which can cause allergic reactions or other problems. Talk to your pharmacist for more details. This medication device should not be used if you have certain medical conditions. Before using this product, consult your doctor or pharmacist if you have: current known or suspected pregnancy, previous ectopic pregnancy, uterus problems (e.g., cancer, endometriosis, fibroids, pelvic inflammatory disease-PID), other IUD (intrauterine device) still in place, vaginal problems (e.g., infection), breast cancer, liver disease/tumors, any condition that affects your immune system (e.g., AIDS, leukemia). Before using this product, tell your doctor your medical history, especially of: bleeding problems (e.g., menstrual changes, clotting problems), heart problems (e.g., congenital valve conditions), high blood pressure, migraine headaches, stroke, diabetes. If you have diabetes, this medication may make it harder to control your blood sugar levels. Monitor your blood sugar regularly as directed by your doctor. Tell your doctor the results and any  symptoms such as increased thirst/urination. Your anti-diabetic medication or diet may need to be adjusted. This medication device may sometimes come out by itself or move out of place. This may result in unwanted pregnancy or other problems. After each menstrual period, check to make sure it is in the right place. Talk to your doctor about how to check your device. If it comes out or you cannot feel its threads, call your doctor promptly, and use a backup birth control method such as condoms. If you or partner has any other sexual partners, this medication device may no longer be a good choice for pregnancy prevention. If you or your partner becomes HIV positive, or if you think you may have been exposed to any sexually transmitted disease, contact your doctor immediately. You should consider having this device removed. This medication device must not be used during pregnancy. If you become pregnant or think you may be pregnant, tell your doctor immediately. If you have just given birth and are not breast-feeding, or if you have had a pregnancy loss or abortion after the 3 months of pregnancy, wait at least 6 weeks (or as directed by your doctor) before using this medication device. Consult your doctor about the problems that may occur during pregnancy while using this product. Levonorgestrel passes into breast milk. Consult your doctor before breast-feeding.    DRUG INTERACTIONS:  Your doctor or pharmacist may already be aware of any possible drug interactions and may be monitoring you for them. Do not start, stop, or change the dosage of any medicine before checking with them first. Before using this medication device, tell your doctor of all prescription and nonprescription medications you may use, especially  of: "blood thinners" (e.g., warfarin), birth control taken by mouth or applied to the skin (patch), certain drug used for varicose vein treatment (sodium tetradecyl sulfate), drugs that affect your immune  response (e.g., corticosteroids such as prednisone). This document does not contain all possible interactions. Therefore, before using this product, tell your doctor or pharmacist of all the products you use. Keep a list of all your medications with you, and share the list with your doctor and pharmacist.    OVERDOSE:  Overdose with this medication is very unlikely because of the way the drug is released from this device. Consult your doctor or pharmacist for more information.    NOTES:  Do not share this medication with others. Keep all appointments with your doctor and the laboratory. You should have regular complete physical exams including blood pressure, breast exam, pelvic exam, and screening for cervical cancer (Pap smear). Follow your doctor's instructions for examining your own breasts, and report any lumps immediately. Consult your doctor for more details.    MISSED DOSE:  Not applicable.    STORAGE:  Before use, store at room temperature at 77 degrees F (25 degrees C) away from light and moisture. Brief storage between 59-86 degrees F (15-30 degrees C) is permitted. Keep all medications and medical devices away from children and pets. Do not flush medications down the toilet or pour them into a drain unless instructed to do so. Properly discard this product when it is expired or no longer needed. Consult your pharmacist or local waste disposal company for more details about how to safely discard your product.    Information last revised June 2010 Copyright(c) 2010 [employer].

## 2011-03-02 NOTE — Progress Notes (Signed)
  Subjective:    Patient ID: Makayla Vasquez, female    DOB: [birth year], 30 y.o.   MRN: [id number]  HPI Pt here for IUD placement. No recent sexual activity per pt. LMP 02/12/11.No recent fevers, abd pain, N/V.    Review of Systems See HPI     Objective:   Physical Exam Gen- in bed, NAD GU- normal external gentalia       Assessment & Plan:  IUD placement-  Procedure: Placement of Mirena " Patient was counseled regarding the risks/benefits/alternatives of the IUD. " Consent was obtained and all questions answered. " Urine pregnancy test negative or patient currently menstruating. Description: Cervix was swabbed three times with Betadine swabs. Sterile gloves donned. ? Sterile single-tooth tenaculum used to grasp anterior lip of the cervix and straighten the endocervical canal. Uterus sounded to 5.5 cm. IUD loaded per manufacturer's instruction and flange set to 5 cm. IUD placed per manufacturer's directions. Strings trimmed to 3 cm. Patient tolerated the procedure well. Follow-up: Patient counseled regarding techniques for self-monitoring of IUD and given precautions. Patient notified of removal date. Patient will follow-up in 2-3 weeks for string check.

## 2011-03-07 NOTE — Cardiovascular Report (Signed)
   NAME:  Makayla Vasquez, Makayla Vasquez                          ACCOUNT NO.:  [id number]   MEDICAL RECORD NO.:  [id number]                   PATIENT TYPE:  OIB   LOCATION:  2864                                 FACILITY:  [facility]   PHYSICIAN:  Colleen Can. Deborah Chalk, M.D.            DATE OF BIRTH:  1981/05/19   DATE OF PROCEDURE:  06/21/2003  DATE OF DISCHARGE:  06/21/2003                              CARDIAC CATHETERIZATION   PROCEDURE:  Generator explant and implantation of a new pulse generator done  with fluoroscopy.   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   PROCEDURE:  The left subclavicular area was prepped and draped.  Because of  truncal obesity, the old pulse generator was located using fluoroscopy and  probing spinal needles.  The area over the pulse generator was infiltrated  with 1% Xylocaine with bicarbonate buffer.  Using sharp and Bovie dissection  the pulse generator was located in the prepectoral fascia.  It was deep and  between 2-3 inches under subcutaneous tissue.  The old pulse generator was  removed.  The old pulse generator was an [employer] [id number] serial [id number],  date of implant August 05, 1996.  The leads were interrogated.  Ventricular  lead thresholds were 0.8 volts to capture, 3.1 MA current.  Impedence was  300 ohms and R-waves were 6.0 millivolts.  Atrial lead was 0.6 volts to  capture, 2.5 MA current with 0.5 millisecond pulse width.  The impedence was  339 ohms.  P-waves were 7.0 millivolts.   The leads were [employer] ventricular 438-10 serial [id number] lead that was  retained with the date of implant of August 05, 1996.  The [employer]  atrial lead was a 438-10 serial [id number] with a date of implant of August 05, 1996.  The old leads were then connected to new pulse generator  [employer] Sigma [id number] serial [id number].  The new pulse generator was  sutured in place at the bottom of the pocket.  The  wound was flushed with  kanamycin solution.  The wound was then closed with 2-0 and subsequently 5-0  Dexon and Steri-Strips were applied.  The patient tolerated the procedure.  She received vancomycin as a preoperative antibiotic.                                                Colleen Can. Deborah Chalk, M.D.    SNT/MEDQ  D:  06/21/2003  T:  06/22/2003  Job:  [id number]   cc:   Jonah Blue, M.D.  [facility] - [address]  [place], [place] [address]  Fax: [phone]

## 2011-03-07 NOTE — Op Note (Signed)
NAME:  Makayla Vasquez, Makayla Vasquez                ACCOUNT NO.:  [id number]   MEDICAL RECORD NO.:  [id number]          PATIENT TYPE:  AMB   LOCATION:  SDS                          FACILITY:  [facility]   PHYSICIAN:  Jefry H. Pollyann Kennedy, MD     DATE OF BIRTH:  05-25-1981   DATE OF PROCEDURE:  10/15/2005  DATE OF DISCHARGE:                                 OPERATIVE REPORT   PREOPERATIVE DIAGNOSIS:  Draining sinus tract, anterior cervical skin.   POSTOPERATIVE DIAGNOSIS:  Draining sinus tract, anterior cervical skin.   PROCEDURE:  Excision of sinus tract.   SURGEON:  Jefry H. Pollyann Kennedy, M.D.   ANESTHESIA:  General endotracheal anesthesia was used.   COMPLICATIONS:  None.   BLOOD LOSS:  None.   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   HISTORY:  This is a 30 year old lady who started having drainage from the  anterior neck of mucoid material intermittently over the past several  months.  She has a history of an operation in that area about 12 or 13 years  prior which she believes was a thyroglossal duct cyst.  No previous records  were available for review.  Risks, benefits, alternatives, complications of  the procedure were explained to the patient who seemed to understand and  agreed to surgery.   DESCRIPTION OF PROCEDURE:  The patient was taken to the operating room and  placed on the operating table in the supine position.  Following the  induction of general endotracheal anesthesia, the patient was prepped and  draped in a standard fashion.  An ellipse of skin surrounding the fistula  was outlined with a marking pen with extension along the lateral skin crease  bilaterally where there appeared to be a scar, although it was difficult to  say for sure.  Lacrimal probes were used to  probe the draining tract which  did not appear to go more than approximately 0.5 cm.  Electrocautery was  used to incise the skin around the ellipse and laterally along the crease  surrounding the sinus tract.  Using careful retraction of the skin in both  directions, the tract was excised in its entirety.  It was followed up to  the fascia overlying the thyrohyoid membrane.  There did not appear to be  any extension into the pharynx or any higher up.  Manual palpation  intraorally did not reveal any masses or any additional mucoid material  draining.  The wound was closed after hemostasis was achieved using  electrocautery.  A two-layer closure was used of 4-0 chromic in the deep  layer and running 5-0 nylon on the skin.  The patient was then awakened from  anesthesia, extubated, and transferred to recovery in stable condition.      Jefry H. Pollyann Kennedy, MD  Electronically Signed     JHR/MEDQ  D:  10/15/2005  T:  10/15/2005  Job:  [id number]

## 2011-03-07 NOTE — Discharge Summary (Signed)
[facility]  Patient:    MERCEDEZ, BOULE                         MRN: [id number] Adm. Date:  [id number] Disc. Date: [id number] Attending:  Otilio Saber Dictator:   Johnella Moloney, N.P.                           Discharge Summary  NO DICATION DD:  02/24/01 TD:  02/24/01 Job: [id number]

## 2011-03-07 NOTE — H&P (Signed)
[facility]  Patient:    Makayla Vasquez, Makayla Vasquez                         MRN: [id number] Adm. Date:  [id number] Attending:  Otilio Saber Dictator:   Landry Corporal, N.P.                   Psychiatric Admission Assessment  IDENTIFYING INFORMATION:  A 30 year old African-American single female, voluntary admitted on February 12, 2001, with depression and psychotic behavior.  HISTORY OF PRESENT ILLNESS:  Patient presents with a history of depression for about a month.  Patient presents feeling "down in the dumps," sad and teary-eyed, with suicidal ideation, with no specific plan or intent.  She has been experiencing auditory hallucinations, command type, to hurt herself, has been having those for about a month.  No visual hallucinations.  Patient also has thoughts of hurting others, no one specific and for no specific reason. Patient also experiencing some positive paranoia for the past 2 months, denies any current suicidal ideation, homicidal ideation, is presently experiencing some paranoia.  Patient was reassured.  Patient also having some auditory hallucinations.  She hears them all day long.  She states she has been sleeping poorly.  She does sleep well when she takes her Zyprexa.  She states her appetite has been good.  PAST PSYCHIATRIC HISTORY:  She has had an inpatient admission to [facility] in December 2001 for depression, [facility] last year for major depression.  Patient occasionally has complaints of suicidal ideation, with cutting herself with some broken glass.  Patient goes to [facility] and was diagnosis with a history of bipolar.  SOCIAL HISTORY:  She is a 30 year old single black female, no children.  She lives with her mother.  She is unemployed.  She has no legal problems.  She has completed the 10th grade.  FAMILY HISTORY:  Mother with depression.  ALCOHOL DRUG HISTORY:  She is a nonsmoker.   She denies any alcohol or substance abuse.  PAST MEDICAL HISTORY:  Patient goes to [facility], sees Dr. Katrinka Blazing. Medical problems:  Patient had a pacemaker placed in 1996 for congestive heart failure.  Medications:  Zyprexa 5 mg at h.s. prescribed from [facility].  Has been on it for 4 months. She states it is effective, but she is noncompliant.  She also was put on Prozac, has been off that for about a month.  She states it was not effective.  DRUG ALLERGIES:  ZOLOFT.  She gets hives.  PHYSICAL EXAMINATION:  Pending.  Patient does present as an overweight young black female, disheveled, without complaints.  Vital signs are stable: 97.5, 88, 28, blood pressure 124/70.  She is 5 feet 5 inches tall and 210 pounds.  MENTAL STATUS EXAMINATION:  She is an alert, young overweight black female. She is cooperative, little eye contact.  She is mentally guarded.  Speech is normal and relevant.  Mood is depressed, affect is flat.  Thought processes: No current suicidal or homicidal ideations.  She does contract for safety, is experiencing some auditory hallucinations.  No visual hallucinations, and is having some paranoid feelings.  Cognitive function is intact.  Memory is fair, judgment is fair, insight is poor.  She appears to be of low intelligence.  ADMISSION DIAGNOSES: Axis I:    Bipolar disorder, depressed. Axis II:   Deferred. Axis III:  Pacemaker. Axis IV:  Moderate, problems relating to social environment, patient appears            isolated, and other psychosocial problems, her mental illness, and            compliance with her medications. Axis V:    Current 30, this past year 73.  INITIAL PLAN OF CARE:  Voluntary admission to [facility] for depression and psychosis.  Contract for safety, check every 15 minutes, patient agrees to be safe.  We will resume her Zyprexa at bedtime, add Prozac for her depressive symptoms.  Our plan is to return her  to her prior living arrangements and have caseworker to offer options to increase her social interaction.  TENTATIVE LENGTH OF STAY:  Four to five days. DD:  02/13/01 TD:  02/13/01 Job: [id number]

## 2011-03-07 NOTE — Discharge Summary (Signed)
[facility]  Patient:    Makayla Vasquez, Makayla Vasquez                         MRN: [id number] Adm. Date:  [id number] Disc. Date: [id number] Attending:  Otilio Saber Dictator:   Johnella Moloney, N.P.                           Discharge Summary  HISTORY OF PRESENT ILLNESS:  Makayla Vasquez is a 30 year old African-American single female voluntarily admitted February 12, 2001, with depression and psychotic symptoms.  The patient presented with a history of depression for about one month.  She was feeling "down in the dumps, sad and teary-eyed, with suicidal ideation but no specific plan or intent, experiencing auditory hallucinations, command type to hurt herself x 1 month, denies visual hallucinations.  She has thoughts of hurting others; no one specific and no specific reason. Experiencing some paranoia for the past two weeks.  Denies any current suicidal or homicidal ideation but she is experiencing some paranoia.  Also having some hallucinations; she hears them all day long.  She states she has been sleeping poorly.  Zyprexa helps her sleep.  Appetite is good.  PAST PSYCHIATRIC HISTORY:  The patient goes to [facility] and is treated there with a diagnosis of bipolar disorder.  She has been hospitalized [facility] for depression and [facility] last year for major depression.  Occasional complaints of suicide.  PAST MEDICAL HISTORY:  Primary care physician is Dr. Katrinka Blazing at [facility]. Medical problems: Status post pacemaker in 1996 for congestive heart failure. Current medications: Zyprexa 5 mg h.s. x 4 months but she has been noncompliant with it, Prozac but she has been off of that for one month.  Drug allergies: ZOLOFT gives her hives.  Physical examination: Overweight, young black female, disheveled without complaints.  Vital signs were stable; temperature 97.5, pulse 88, respirations 28, blood  pressure 124/70, height 5 feet 5 inches, weight 210 pounds.  Laboratory work: CBC: Within normal limits. Chemistry profile: AST was slightly elevated at 38, ALT slightly elevated at 47.  Thyroid panel: Within normal limits.  Urine drug screen was negative. Urinalysis did show some urobilinogen at 1.0; otherwise everything was negative.  MENTAL STATUS EXAMINATION ON ADMISSION:  Alert, young, overweight black female.  Cooperative, little eye contact, minimally guarded.  Speech: Normal and relevant.  Mood: Depressed.  Affect: Flat.  Thought processes: Experiencing auditory hallucinations but no visual hallucinations but is having some paranoia.  No current suicidal or homicidal ideation or intent. Cognitive functioning appears to be intact.  Memory is fair.  Judgment is fair.  Insight: Poor.  Appears to be of low intelligence.  ADMITTING DIAGNOSES: Axis I:    Bipolar disorder, depressed. Axis II:   Deferred. Axis III:  Status post pacemaker. Axis IV:   Moderate problems relating to social environment, the patient            appears isolated, and other psychosocial stressors problems with            mental illness and compliance with medications. Axis V:    Current global assessment of functioning 30, highest in the patient            year 47.  HOSPITAL COURSE:  [facility] of her depression and some  psychotic symptoms.  We first started her out on Zyprexa 10 mg p.o. at h.s., then we also ordered Zyprexa 2.5 mg p.o. q.a.m. and added Prozac 20 mg p.o. q.d.  In the hospital, she did quite well. She was sleeping good.  Appetite was good.  No evidence of psychosis.  She did report hearing voices the night before laughing.  No depression.  Denies suicidal or homicidal ideation, denies paranoia.  She was somewhat seclusive and stayed in her room.  She minimized her symptoms.  She continued to have auditory hallucinations but does not  really know what they are saying.  On April 30, she reported feeling much better.  Mood and affect were brighter. Sleeping and eating better.  She denied hearing voices.  She denied any suicidal or homicidal ideations.  She was tolerating her medications well and it was felt that she could be managed on an outpatient basis with followup.  CONDITION ON DISCHARGE:  The patient was discharged in improved condition with improvement in mood, sleep, appetite, no suicidal or homicidal ideation or intent, no overt signs of psychosis, and the patient seemed improved.  DISPOSITION:  The patient is discharged home.  [facility] at 1 p.m. with Harolyn Rutherford.  DISCHARGE MEDICATIONS: 1. Prozac 20 mg one tablet q.a.m. 2. Zyprexa 5 mg one half tablet in the morning and two tablets at h.s.  FINAL DIAGNOSES: Axis I:    Bipolar disorder, depressed. Axis II:   Deferred. Axis III:  Status post pacemaker in 1996. Axis IV:   Moderate problems relating to social environment, isolation, and            dealing with her mental illness. Axis V:    Current global assessment of functioning 54, highest in the patient            year 46. DD:  02/24/01 TD:  02/24/01 Job: [id number]

## 2011-03-07 NOTE — H&P (Signed)
   NAME:  Makayla Vasquez, Makayla Vasquez NO.:  [id number]   MEDICAL RECORD NO.:  [id number]                   PATIENT TYPE:  OIB   LOCATION:                                       FACILITY:  [facility]   PHYSICIAN:  Colleen Can. Deborah Chalk, M.D.            DATE OF BIRTH:  [birth year]   DATE OF ADMISSION:  06/21/2003  DATE OF DISCHARGE:                                HISTORY & PHYSICAL   CHIEF COMPLAINT:  None.   HISTORY OF PRESENT ILLNESS:  The patient is a 30 year old black female who  has had a permanent pacemaker implanted for some degree of congenital heart  disease that was originally performed at the [facility] at age 30.  She  has had regular pacemaker followup and on June 14, 2003 has reached  elective replacement indicators.  Clinically, she has had some light-  headedness and dizziness but no frank syncope.  She is now referred for  elective generator replacement.   PAST MEDICAL HISTORY:  1. Congenital heart disease of uncertain etiology.  2. Functioning pacemaker.  3. Bipolar disorder.  4. Obesity.   ALLERGIES:  ZOLOFT.   CURRENT MEDICATIONS:  1. Birth control pills daily.  2. Rhinocort spray daily.  3. Multivitamin daily.  4. Cough medicine p.r.n.   FAMILY HISTORY:  Both of her parents are living in their 71s, her mother has  lupus, heart disease and hypertension; father has a questionable history of  heart disease.   SOCIAL HISTORY:  She is single, she works at a day care, she has had no  tobacco products for over 5 years, there is no alcohol use.   REVIEW OF SYSTEMS:  Basically as noted above and is otherwise unremarkable.   PHYSICAL EXAMINATION:  GENERAL:  She is a pleasant black female in no acute  distress.  VITAL SIGNS:  Blood pressure 120/80 sitting, 100/80 standing; heart rate 60,  respirations 18, she is afebrile.  SKIN:  Warm and dry, color is unremarkable.  LUNGS:  Clear.  HEART:  Regular rhythm, pacemaker is in the left upper chest.  ABDOMEN:  Obese.  EXTREMITIES:  Without edema.   LABORATORY DATA:  Pending.    OVERALL IMPRESSION:  1. End-of-life pulse generator.  2. Functioning dual-chamber pacemaker.  3. Congenital heart disease.  4. Obesity.   PLAN:  We will proceed on with elective generator replacement.  The  procedure has been explained in full detail and she is willing to proceed on  Wednesday, June 21, 2003.      Juanell Fairly C. Earl Gala, N.P.                 Colleen Can. Deborah Chalk, M.D.    LCO/MEDQ  D:  06/19/2003  T:  06/19/2003  Job:  [id number]   cc:   Jonah Blue, M.D.  Family Prac Resident - [address]  [place], [place] [address]  Fax: [phone]

## 2011-03-07 NOTE — Op Note (Signed)
NAME:  Makayla Vasquez, Makayla Vasquez NO.:  [id number]   MEDICAL RECORD NO.:  [id number]                   PATIENT TYPE:  OBV   LOCATION:  0476                                 FACILITY:  [facility]   PHYSICIAN:  Lorre Munroe., M.D.            DATE OF BIRTH:  1981/07/14   DATE OF PROCEDURE:  03/22/2004  DATE OF DISCHARGE:                                 OPERATIVE REPORT   PREOPERATIVE DIAGNOSIS:  Symptomatic gallstones.   POSTOPERATIVE DIAGNOSIS:  Symptomatic gallstones.   OPERATION:  Laparoscopic cholecystectomy.   SURGEON:  Lebron Conners, M.D.   ASSISTANT:  Dr. Earlene Plater.   ANESTHESIA:  General.   PROCEDURE:  After the patient was monitored and anesthetized and had routine  preparation and draping of the abdomen,  I infiltrated local anesthetic into  the tissues just below the umbilicus.  We made a short transverse incision  and dissected down to the abdominal fascia, which I cut for about 2 cm in  the midline.  I entered the peritoneal cavity bluntly, then placed a 0  Vicryl purse-string suture in the fascia, secured a Hasson cannula, and  inflated the abdomen with CO2.  I then did laparoscopy and saw no  abnormalities except for some slightly unusual-appearing whitish scars of  the liver and adhesions of the omentum to the outer surface of the  gallbladder.  I then anesthetized three additional sites, and under direct  vision, placed three additional ports and positioned the patient head-up,  foot-down, and tilted to the left.  Retracting the fundus of the gallbladder  towards the right shoulder, I took down the adhesions and then pulled the  infundibulum laterally.  Tissues in the area of the triangle of Calot were  rather tight.  I sized the tissues anteriorly and posteriorly and then  dissected out the cystic artery, which lay immediately over the top of the  cystic duct.  I clipped it with three clips and cut it between the two,  which were closest to the  gallbladder.  I then dissected out the cystic duct  until I could see it clearly emerging from the infundibulum of the  gallbladder and I could see its junction with the common bile duct.  I  clipped it with three clips and cut between the two closest to the  gallbladder.  I then used the spatula and clip instruments with cautery to  dissect the gallbladder away from the liver.  I found an additional small  artery, which I clipped and divided.  As I neared the fundus, the  gallbladder seemed quite scarred into the liver.  The plane of dissection  was difficult, and I accidentally encountered a little bit of bleeding from  the gallbladder bed.  I controlled that with application of Surgicel topical  hemostat and with cautery.  At the conclusion of the operation, hemostasis  was good.  I removed blood clots with  a large sucker and then irrigated the  operative area and removed that irrigant.  I then detached the gallbladder  off the liver and removed it through the umbilical incision and tied the  purse-string suture.  I again  checked for hemostasis and found it good.  I allowed the CO2 to escape and  after removing the lateral ports under direct vision.  I then removed the  epigastric port.  I closed all of the skin incisions with a buried  intracuticular 4-0 Vicryl suture and reinforced by Steri-Strips.  Patient  was stable throughout the operation.                                               Lorre Munroe., M.D.    Jodi Marble  D:  03/22/2004  T:  03/22/2004  Job:  [id number]   cc:   Jonah Blue, M.D.  Family Prac Resident - [address]  [place], [place] [address]  Fax: [phone]

## 2011-03-24 ENCOUNTER — Ambulatory Visit (INDEPENDENT_AMBULATORY_CARE_PROVIDER_SITE_OTHER): Payer: Medicare Other | Admitting: Family Medicine

## 2011-03-24 ENCOUNTER — Ambulatory Visit
Admission: RE | Admit: 2011-03-24 | Discharge: 2011-03-24 | Disposition: A | Discharge status: Home / Self Care | Payer: Medicare Other | Source: Ambulatory Visit | Attending: Family Medicine | Admitting: Family Medicine

## 2011-03-24 ENCOUNTER — Encounter: Payer: Self-pay | Admitting: Family Medicine

## 2011-03-24 VITALS — BP 110/76 | HR 80 | Temp 98.1°F | Wt 220.0 lb

## 2011-03-24 DIAGNOSIS — R102 Pelvic and perineal pain: Secondary | ICD-10-CM

## 2011-03-24 DIAGNOSIS — F319 Bipolar disorder, unspecified: Secondary | ICD-10-CM

## 2011-03-24 DIAGNOSIS — Z30431 Encounter for routine checking of intrauterine contraceptive device: Secondary | ICD-10-CM | POA: Insufficient documentation

## 2011-03-24 DIAGNOSIS — N949 Unspecified condition associated with female genital organs and menstrual cycle: Secondary | ICD-10-CM

## 2011-03-24 MED ORDER — LAMOTRIGINE 25 MG PO TABS: ORAL_TABLET | ORAL | Status: DC

## 2011-03-24 NOTE — Patient Instructions (Addendum)
It was good to see you today  I am going to get an ultrasound of your uterus to make sure your mirena IUD is stable.  Pelvic pain and spotting are common complications of an IUD You can use ibuprofen 800mg  every 6-8 hours as needed for pain.  If your pelvic pain and spotting significantly worsen or if you develop a fever, please give [place] a call or go the ED I am glad to see that your mood is doing better Come back to see me in 1 month for follow up If you develop any suicidal or homicidal thoughts, please give [place] a call Otherwise, please call with any other additional questions God Bless, Doree Albee MD

## 2011-03-24 NOTE — Assessment & Plan Note (Signed)
Overall stable. Will continue with lamictal 50mg  po BID dosing. MAx dose noted to be 200mg  daily.

## 2011-03-24 NOTE — Assessment & Plan Note (Addendum)
Migration of IUD strings is noted on today's exam compared to initial insertion 02/28/11. Will send pt for complete pelvic u/s to rule out IUD perforation. Overall symptoms consistent with common complications of IUD placement including intemittent cramping and spotting. If perforated, will refer to GYN for further management.

## 2011-03-24 NOTE — Progress Notes (Signed)
  Subjective:    Patient ID: [name], female    DOB: 08-12-1981, 30 y.o.   MRN: [id number]  HPI Pt here for chronic problem follow up. Mood: Stable since starting lamictal. No HI/SI. Racing thoughts, insomnia improved since starting medication. Has had some fatigue. Though pt states this is likely secondary to starting work last week and also moving.   IUD: Overall stable. Was placed 02/28/11. Has had intermittent bleeding since placement. Also with intermittent abd pain. Rates pain as 6/10 when it occurs. Cramping type of pain. Usually last approx 5 minutes per pt. Has had 4-5 episodes since placement on 5/11. No dysuria, polyuria, no fevers, no hx/o kidney stones.    Review of Systems See HPI     Objective:   Physical Exam  Genitourinary:     Gen: up in chair, NAD ABD: obese, non-tender, + bowel sounds GU: normal external genitalia, 1 IUD string visualized at cervical os (approx 1.5 cm out of cervical os @ 12 oclock--> previously, both strings at 3 cms.)         Assessment & Plan:  Mood: Overall stable. Will continue with lamictal 50mg  po BID dosing. MAx dose noted to be 200mg  daily. IUD: Migration of IUD strings is noted on today's exam compared to initial insertion 02/28/11. Will send pt for complete pelvic u/s to rule out IUD perforation. Overall symptoms consistent with common complications of IUD placement including intemittent cramping and spotting. If perforated, will refer to GYN for further management.

## 2011-03-25 ENCOUNTER — Telehealth: Payer: Self-pay | Admitting: Family Medicine

## 2011-03-25 NOTE — Telephone Encounter (Signed)
Called and discussed results of ultrasound with pt. Let pt know there was no uterine perforation. Instructed pt that cramping and bleeding likely common complications of IUD insertion. Re-discussed red flags ( intractable abd pain and vaginal bleeding). Re-instructed pt to use prn ibuprofen for pain. Pain overall stable per pt. Pt agreeable to plan.

## 2011-04-15 ENCOUNTER — Ambulatory Visit (INDEPENDENT_AMBULATORY_CARE_PROVIDER_SITE_OTHER): Payer: Medicare Other | Admitting: Family Medicine

## 2011-04-15 DIAGNOSIS — IMO0001 Reserved for inherently not codable concepts without codable children: Secondary | ICD-10-CM

## 2011-04-15 DIAGNOSIS — Z309 Encounter for contraceptive management, unspecified: Secondary | ICD-10-CM

## 2011-04-15 DIAGNOSIS — I10 Essential (primary) hypertension: Secondary | ICD-10-CM

## 2011-04-15 NOTE — Patient Instructions (Signed)
It was good to see you again I am checking some labs to make sure that your electrolytes are normal Make sure to set up an appointment with Dr. [place] for the nexplanon insertion. After this is done, make an appt to see me for your IUD removal Restart the miralax. Be sure to use twice daily for the first week, then twice daily thereafter. Restart your lamictal at the previous dose.  If you have any other questions, please give [place] a call God Bless, Doree Albee MD

## 2011-04-16 ENCOUNTER — Other Ambulatory Visit: Payer: Self-pay | Admitting: Family Medicine

## 2011-04-16 LAB — BASIC METABOLIC PANEL
CO2: 23 mEq/L (ref 19–32)
Calcium: 9 mg/dL (ref 8.4–10.5)
Potassium: 4.2 mEq/L (ref 3.5–5.3)
Sodium: 142 mEq/L (ref 135–145)

## 2011-04-17 NOTE — Assessment & Plan Note (Signed)
Options discussed with pt. She is willing to wait for nexplanon insertion before removal of IUD. Will set up pt in Dr. Elvis Coil clinic. Will remove IUD after this. Pt agreeable to plan.

## 2011-04-17 NOTE — Assessment & Plan Note (Signed)
verall stable on HCTZ. Will check BMET.  Leg cramping: ? Thiazide induced hypokalemia vs. dehydration. Encouraged po intake. Will check BMET.

## 2011-04-17 NOTE — Progress Notes (Signed)
  Subjective:    Patient ID: Makayla Vasquez, female    DOB: 1981-06-02, 30 y.o.   MRN: [id number]  HPI Pt here to discuss removal of IUD. Pt recently seen for pelvic pain 03/24/11. IUD strings were noted to have migrated (but still visible) at that time from initial insertion. Pt sent for pelvic ultrasound with concern for possible uterine perforation. U/S showed  IUD in lower uterine segment and cervix.  Pt states that she has had intermittent pelvic pain since last clinical visit and desires another form of birth control. No fevers, vaginal discharge, abnormal vaginal bleeding. Does not desire OCPs or depo. Is interested in implanon/nexplanon.  HTN: Currently on HCTZ 12.5. Has been compliant with this medication. Is not checking BPs on a regular basis. No HA, CP, SOB. HAs been having some intermittent cramping in R leg. HAs been ambulating regularly in hot weather per pt. No swelling.   Review of Systems See HPI.     Objective:   Physical Exam Gen: alert, NAD CV: RRR, no rubs, gallops, murmurs PULM: CTAB ABD: S/NT/ND EXT: 2+ peripheral pulse, no edema       Assessment & Plan:  Contraception: Options discussed with pt. She is willing to wait for nexplanon insertion before removal of IUD. Will set up pt in Dr. Elvis Coil clinic. Will remove IUD after this. Pt agreeable to plan. HTN: Overall stable on HCTZ. Will check BMET.  Leg cramping: ? Thiazide induced hypokalemia vs. dehydration. Encouraged po intake. Will check BMET.

## 2011-04-21 ENCOUNTER — Encounter: Payer: Self-pay | Admitting: Family Medicine

## 2011-04-21 ENCOUNTER — Ambulatory Visit (INDEPENDENT_AMBULATORY_CARE_PROVIDER_SITE_OTHER): Payer: Medicare Other | Admitting: Family Medicine

## 2011-04-21 VITALS — BP 114/76 | HR 80 | Temp 98.0°F | Ht 66.0 in | Wt 216.9 lb

## 2011-04-21 DIAGNOSIS — Z3046 Encounter for surveillance of implantable subdermal contraceptive: Secondary | ICD-10-CM

## 2011-04-21 DIAGNOSIS — IMO0001 Reserved for inherently not codable concepts without codable children: Secondary | ICD-10-CM

## 2011-04-21 DIAGNOSIS — Z30017 Encounter for initial prescription of implantable subdermal contraceptive: Secondary | ICD-10-CM

## 2011-04-21 DIAGNOSIS — Z309 Encounter for contraceptive management, unspecified: Secondary | ICD-10-CM

## 2011-04-21 MED ORDER — ETONOGESTREL 68 MG ~~LOC~~ IMPL
1.0000 | DRUG_IMPLANT | Freq: Once | SUBCUTANEOUS | Status: AC
  Administered 2011-04-21: 1 via SUBCUTANEOUS

## 2011-04-21 NOTE — Assessment & Plan Note (Signed)
Pt interested in long term reversible contraception, but did not like Mirena.  Implanon inserted today.  Pt to return in 1 week for Mirena removal.

## 2011-04-21 NOTE — Progress Notes (Signed)
  Subjective:    Patient ID: Makayla Vasquez, female    DOB: 01/27/1981, 30 y.o.   MRN: [id number]  HPI  29 you 5 months post partum here for Nexplanon.  She has Mirena in place, but feels that it is causing significant abdominal pain and she would like it out.  Would like effective contraception, and Nexplanon has been discussed with her PCP, Dr. Alvester Morin.  PMH, meds allergies reviewed. We reviewed the risks and benefits of Nexplanon, including insertion risks of bleeding, infection, and placement in muscle or other structures in the arm, resulting in damage and the need for a surgery to removed the device. Side effects of the Nexplanon discussed, including irreg bleeding. Pt would like to proceed with Nexplanon.  She is not currently taking lamotrigine, and risks of possible interaction with lamotrigine were discussed.  Pt is right hand dominant, but requests the Nexplanon in her R arm since her child often lies on the L arm, and her pacemaker is in place on the L.         Review of SystemsSee HPI     Objective:   Physical Exam    After risks and benefits reviewed and informed consent obtained and time out done, Area prepped with betadine, and approx 1 cc of lidocaine injected into insertion site in R arm.   Nexplanon lot #[id number] inserted without complication.  Both provider and pt palpated the device in place.  Steristrips applied and then pressure bandage.  Pt tolerated procedure well.      Assessment & Plan:

## 2011-04-30 ENCOUNTER — Ambulatory Visit: Payer: Medicare Other | Admitting: Family Medicine

## 2011-05-22 ENCOUNTER — Ambulatory Visit (INDEPENDENT_AMBULATORY_CARE_PROVIDER_SITE_OTHER): Payer: Medicare Other | Admitting: Family Medicine

## 2011-05-22 DIAGNOSIS — N390 Urinary tract infection, site not specified: Secondary | ICD-10-CM

## 2011-05-22 DIAGNOSIS — Z309 Encounter for contraceptive management, unspecified: Secondary | ICD-10-CM

## 2011-05-22 DIAGNOSIS — IMO0001 Reserved for inherently not codable concepts without codable children: Secondary | ICD-10-CM

## 2011-05-22 LAB — POCT URINALYSIS DIPSTICK
Glucose, UA: NEGATIVE
Nitrite, UA: NEGATIVE
Spec Grav, UA: >=1.03

## 2011-05-22 LAB — POCT UA - MICROSCOPIC ONLY: Epithelial cells, urine per micros: >20

## 2011-05-22 MED ORDER — CEPHALEXIN 250 MG PO CAPS: 250.0000 mg | ORAL_CAPSULE | Freq: Three times a day (TID) | ORAL | Status: AC

## 2011-05-22 NOTE — Progress Notes (Signed)
  Subjective:    Patient ID: Makayla Vasquez, female    DOB: [birth year], 30 y.o.   MRN: [id number]  HPI  Had mirena placed Is not happy with the mirena--feels like she is having pelvic pain with it. Had explanon placed and is here for mirena removal. Not currently taking her lamictal.  Has had some increase in urinary frequency but no pelvic pain. Mild burning with urination. Wants  mirena removed and to be checked for uti  Review of Systems No fever.     Objective:   Physical Exam   WD WN NAD GU externally normal Bimanual without any adnexal masses. No cervical motion tenderness. nomral uterus and normal appearing cervix. No IUD strings visible at OS. A cervical brush was used to gently enter the outer most portion of cervical canal and attempted to snag the iud strings. This was repeated x 3 and no strings were visualized     Assessment & Plan:  1. Contraception; wants iud removed--it was seen on [place] in June--it was low lying at that time. Unclear to me if it is stillpresent---will refer to GYn clinic forr further eval--they will likely need to [place] again. Using nexplanon for contraception. 2. uti--7 days abx.

## 2011-05-28 ENCOUNTER — Telehealth: Payer: Self-pay | Admitting: Family Medicine

## 2011-05-28 NOTE — Telephone Encounter (Signed)
Pt is asking about referral to GYN

## 2011-05-30 ENCOUNTER — Telehealth: Payer: Self-pay | Admitting: *Deleted

## 2011-05-30 NOTE — Telephone Encounter (Signed)
Pt has appt with [facility] gyn clinic 9.13.2012 @ 330 pm. lvm to inform her of appt. Asked pt to call their office at [phone] if she is unable to keep appt.[name], [name]

## 2011-05-30 NOTE — Telephone Encounter (Signed)
Called and lvm to inform pt of appt at [facility] clinic on 09.13.2012 @ 330 pm. Pt is to call: [phone] if she is unable to keep appt.[name], [name]

## 2011-06-17 ENCOUNTER — Encounter: Payer: Self-pay | Admitting: Nurse Practitioner

## 2011-06-17 ENCOUNTER — Ambulatory Visit (INDEPENDENT_AMBULATORY_CARE_PROVIDER_SITE_OTHER): Payer: Medicare Other | Admitting: Nurse Practitioner

## 2011-06-17 DIAGNOSIS — K3189 Other diseases of stomach and duodenum: Secondary | ICD-10-CM

## 2011-06-17 DIAGNOSIS — R002 Palpitations: Secondary | ICD-10-CM

## 2011-06-17 DIAGNOSIS — Z950 Presence of cardiac pacemaker: Secondary | ICD-10-CM

## 2011-06-17 DIAGNOSIS — K30 Functional dyspepsia: Secondary | ICD-10-CM

## 2011-06-17 NOTE — Assessment & Plan Note (Addendum)
We will send a message to device clinic to make sure follow up is in order. I will have her see Dr. Johney Frame for her cardiology care in light of Dr. Ronnald Nian retirement.

## 2011-06-17 NOTE — Patient Instructions (Signed)
Try some OTC Prilosec for your indigestion We will see when you need to get your pacemaker checked again I will have you see Dr. Johney Frame in 6 months for your cardiology care Call for any problems.

## 2011-06-17 NOTE — Assessment & Plan Note (Signed)
This is chronic but not bothersome for her.

## 2011-06-17 NOTE — Assessment & Plan Note (Signed)
This is chronic. I have suggested she try some OTC prilosec.

## 2011-06-17 NOTE — Progress Notes (Signed)
    Makayla Vasquez Date of Birth: 12-02-1980   History of Present Illness: Makayla Vasquez is seen back today for her 6 month visit. She is a former patient of Dr. Ronnald Nian. She is seen for Dr. Johney Frame. She has her pacemaker in place since age 30. She has not had a recent check. She has chronic palpitations but notes no problem in that regards. She has lost a little weight. She is on basically no medicines. She has chronic indigestion. No chest pain or shortness of breath. She is enjoying motherhood.  No current outpatient prescriptions on file prior to visit.    Allergies  Allergen Reactions  . Sertraline Hcl     REACTION: hives    Past Medical History  Diagnosis Date  . Heart palpitations   . Cardiac pacemaker     Since age 32. Last generator change in 2004  . Congenital complete AV block   . Bipolar affective disorder   . Obesity   . GERD (gastroesophageal reflux disease)   . Asymptomatic LV dysfunction     Echo in Dec 2011 with EF 35 to 40%. Felt to be due to paced rhythm    Past Surgical History  Procedure Date  . Throat surgery 1994    s/p laser treatment    History  Smoking status  . Former Smoker  . Types: Cigarettes  Smokeless tobacco  . Not on file    History  Alcohol Use No    Family History  Problem Relation Age of Onset  . Heart disease Mother   . Hypertension Mother   . Heart disease Father   . Heart disease Sister     cardiomegaly and atherosclerosis    Review of Systems: The review of systems is positive for chronic indigestion.  All other systems were reviewed and are negative.  Physical Exam: BP 112/80  Pulse 66  Ht 5\' 6"  (1.676 m)  Wt 219 lb (99.338 kg)  BMI 35.35 kg/m2 Patient is very pleasant and in no acute distress. She is overweight. Skin is warm and dry. Color is normal.  HEENT is unremarkable. Normocephalic/atraumatic. PERRL. Sclera are nonicteric. Neck is supple. No masses. No JVD. Lungs are clear. Cardiac exam shows a regular rate  and rhythm. Pacemaker is in the left chest and is unremarkable. Abdomen is obese but soft. Extremities are without edema. Gait and ROM are intact. No gross neurologic deficits noted.   LABORATORY DATA: N/A   Assessment / Plan:

## 2011-06-19 ENCOUNTER — Telehealth: Payer: Self-pay | Admitting: Cardiology

## 2011-06-19 NOTE — Telephone Encounter (Signed)
Left message for pt to call [phone] to schedule appt for pacer check for September. Also left call back number for this office [phone] for further questions.

## 2011-07-03 ENCOUNTER — Encounter: Payer: Self-pay | Admitting: Obstetrics and Gynecology

## 2011-07-03 ENCOUNTER — Ambulatory Visit (INDEPENDENT_AMBULATORY_CARE_PROVIDER_SITE_OTHER): Payer: Medicare Other | Admitting: Obstetrics and Gynecology

## 2011-07-03 VITALS — BP 110/72 | HR 82 | Temp 97.5°F | Ht 66.0 in | Wt 215.5 lb

## 2011-07-03 DIAGNOSIS — Z30431 Encounter for routine checking of intrauterine contraceptive device: Secondary | ICD-10-CM

## 2011-07-07 NOTE — Progress Notes (Signed)
Subjective: Patient presents today for IUD removal. She states that the IUD is causing her a lot of pain and discomfort. She was originally seen by her family Tax adviser. She was referred to family practice for IUD removal. She denies any other problems.  Objective: Patient presents alert and oriented x3 in no apparent distress. Vital signs are stable.  Bedside ultrasound was performed. It did demonstrate the IUD to be within the uterus. Patient did have normal external female genitalia. Speculum exam was performed. Cervix was easily visualized. IUD strings were not visible. Ring forceps were used to probe for the IUD. This attempt was unsuccessful.  Assessment and plan: Lost IUD: Patient will be scheduled for a formal ultrasound to determine if the IUD is embedded within the myometrial tissue. She will followup one week after this exam. If she has any problems in the meantime, she'll contact us immediately. She understood this and agreed.  Clinton Gallant. Rice III, DrHSc, MPAS, PA-C

## 2011-07-08 ENCOUNTER — Ambulatory Visit (HOSPITAL_COMMUNITY)
Admission: RE | Admit: 2011-07-08 | Discharge: 2011-07-08 | Disposition: A | Discharge status: Home / Self Care | Payer: Medicare Other | Source: Ambulatory Visit | Attending: Obstetrics and Gynecology | Admitting: Obstetrics and Gynecology

## 2011-07-08 DIAGNOSIS — Z30431 Encounter for routine checking of intrauterine contraceptive device: Secondary | ICD-10-CM | POA: Insufficient documentation

## 2011-07-10 ENCOUNTER — Telehealth: Payer: Self-pay | Admitting: Family Medicine

## 2011-07-10 NOTE — Telephone Encounter (Signed)
Makayla Vasquez needs to know when her last TB was.  Please give her a call.

## 2011-07-10 NOTE — Telephone Encounter (Signed)
Waiting for pt to call back. Do not see PPD in chart or EMR. Lorenda Hatchet, Renato Battles (should have one every year)

## 2011-07-10 NOTE — Telephone Encounter (Signed)
Spoke with patient, told her she will need to come in for nurse visit for PPD. She will call to schedule appointment.Makayla Vasquez, Rodena Medin

## 2011-07-14 ENCOUNTER — Encounter: Payer: Self-pay | Admitting: Internal Medicine

## 2011-07-14 ENCOUNTER — Ambulatory Visit (INDEPENDENT_AMBULATORY_CARE_PROVIDER_SITE_OTHER): Payer: Medicare Other | Admitting: *Deleted

## 2011-07-14 ENCOUNTER — Other Ambulatory Visit: Payer: Self-pay | Admitting: Internal Medicine

## 2011-07-14 DIAGNOSIS — I442 Atrioventricular block, complete: Secondary | ICD-10-CM

## 2011-07-14 LAB — PACEMAKER DEVICE OBSERVATION
AL IMPEDENCE PM: 409 Ohm
AL THRESHOLD: 1 V
ATRIAL PACING PM: 42.1
RV LEAD IMPEDENCE PM: 289 Ohm

## 2011-07-14 NOTE — Progress Notes (Signed)
PPM check 

## 2011-07-30 ENCOUNTER — Emergency Department (HOSPITAL_COMMUNITY)
Admission: EM | Admit: 2011-07-30 | Discharge: 2011-07-31 | Disposition: A | Discharge status: Home / Self Care | Payer: Medicare Other | Attending: Emergency Medicine | Admitting: Emergency Medicine

## 2011-07-30 DIAGNOSIS — R0789 Other chest pain: Secondary | ICD-10-CM | POA: Insufficient documentation

## 2011-07-30 DIAGNOSIS — R079 Chest pain, unspecified: Secondary | ICD-10-CM | POA: Insufficient documentation

## 2011-07-30 DIAGNOSIS — Z950 Presence of cardiac pacemaker: Secondary | ICD-10-CM | POA: Insufficient documentation

## 2011-07-31 ENCOUNTER — Emergency Department (HOSPITAL_COMMUNITY): Payer: Medicare Other

## 2011-07-31 ENCOUNTER — Ambulatory Visit (INDEPENDENT_AMBULATORY_CARE_PROVIDER_SITE_OTHER): Payer: Medicare Other | Admitting: Family Medicine

## 2011-07-31 VITALS — BP 114/85 | HR 98 | Temp 96.8°F | Ht 66.0 in | Wt 216.8 lb

## 2011-07-31 DIAGNOSIS — T8389XA Other specified complication of genitourinary prosthetic devices, implants and grafts, initial encounter: Secondary | ICD-10-CM

## 2011-07-31 DIAGNOSIS — T839XXA Unspecified complication of genitourinary prosthetic device, implant and graft, initial encounter: Secondary | ICD-10-CM

## 2011-07-31 LAB — CBC
Hemoglobin: 12.7 g/dL (ref 12.0–15.0)
MCH: 27.1 pg (ref 26.0–34.0)
MCV: 84.4 fL (ref 78.0–100.0)
RBC: 4.69 MIL/uL (ref 3.87–5.11)
WBC: 4.8 10*3/uL (ref 4.0–10.5)

## 2011-07-31 LAB — COMPREHENSIVE METABOLIC PANEL
Albumin: 3.7 g/dL (ref 3.5–5.2)
Alkaline Phosphatase: 65 U/L (ref 39–117)
CO2: 24 mEq/L (ref 19–32)
Chloride: 107 mEq/L (ref 96–112)
Glucose, Bld: 102 mg/dL — ABNORMAL HIGH (ref 70–99)
Total Protein: 7.8 g/dL (ref 6.0–8.3)

## 2011-07-31 LAB — DIFFERENTIAL
Lymphocytes Relative: 27 % (ref 12–46)
Lymphs Abs: 1.3 10*3/uL (ref 0.7–4.0)
Monocytes Relative: 7 % (ref 3–12)
Neutro Abs: 3.2 10*3/uL (ref 1.7–7.7)
Neutrophils Relative %: 66 % (ref 43–77)

## 2011-07-31 LAB — POCT I-STAT TROPONIN I

## 2011-07-31 LAB — LIPASE, BLOOD: Lipase: 25 U/L (ref 11–59)

## 2011-07-31 NOTE — Progress Notes (Signed)
Patient seen for IUD removal.  This was attempted approximately 1 month ago, but not successful.  A [place] was preformed, showing the IUD in the lower uterine segment.       Patient was in the dorsal lithotomy position, normal external genitalia was noted.  A speculum was placed in the patient's vagina, normal discharge was noted, no lesions. The multiparous cervix was visualized, no lesions, no abnormal discharge,  and was swabbed with Betadine using scopettes.  A single tooth tenaculum was placed on the anterior cervical lip for stabilization.  The strings were not visualized.  A pair of kelly's were inserted in attempt to feel for the IUD, but was not felt.  Dr Macon Large was consulted, who also attempted to feel for the IUD, but was unsuccessful.  The procedure was terminated.   P: will have patient scheduled for hysteroscope in the OR for IUD removal.

## 2011-08-28 ENCOUNTER — Telehealth: Payer: Self-pay | Admitting: Internal Medicine

## 2011-08-28 NOTE — Telephone Encounter (Signed)
Pt having dizziness, fatigue x 1 week

## 2011-08-28 NOTE — Telephone Encounter (Signed)
Left message for patient to return my call.

## 2011-09-04 NOTE — Telephone Encounter (Signed)
Tried to call again and now the number is temporarily  D/c'd

## 2011-09-07 IMAGING — CR DG ABDOMEN 2V
2 series · 2 of 2 positions shown · non-contrast
Comparison: Two views abdomen 08/02/2007.

CLINICAL DATA: Abdominal pain.

ABDOMEN - 2 VIEW

[w abdomen upright *]
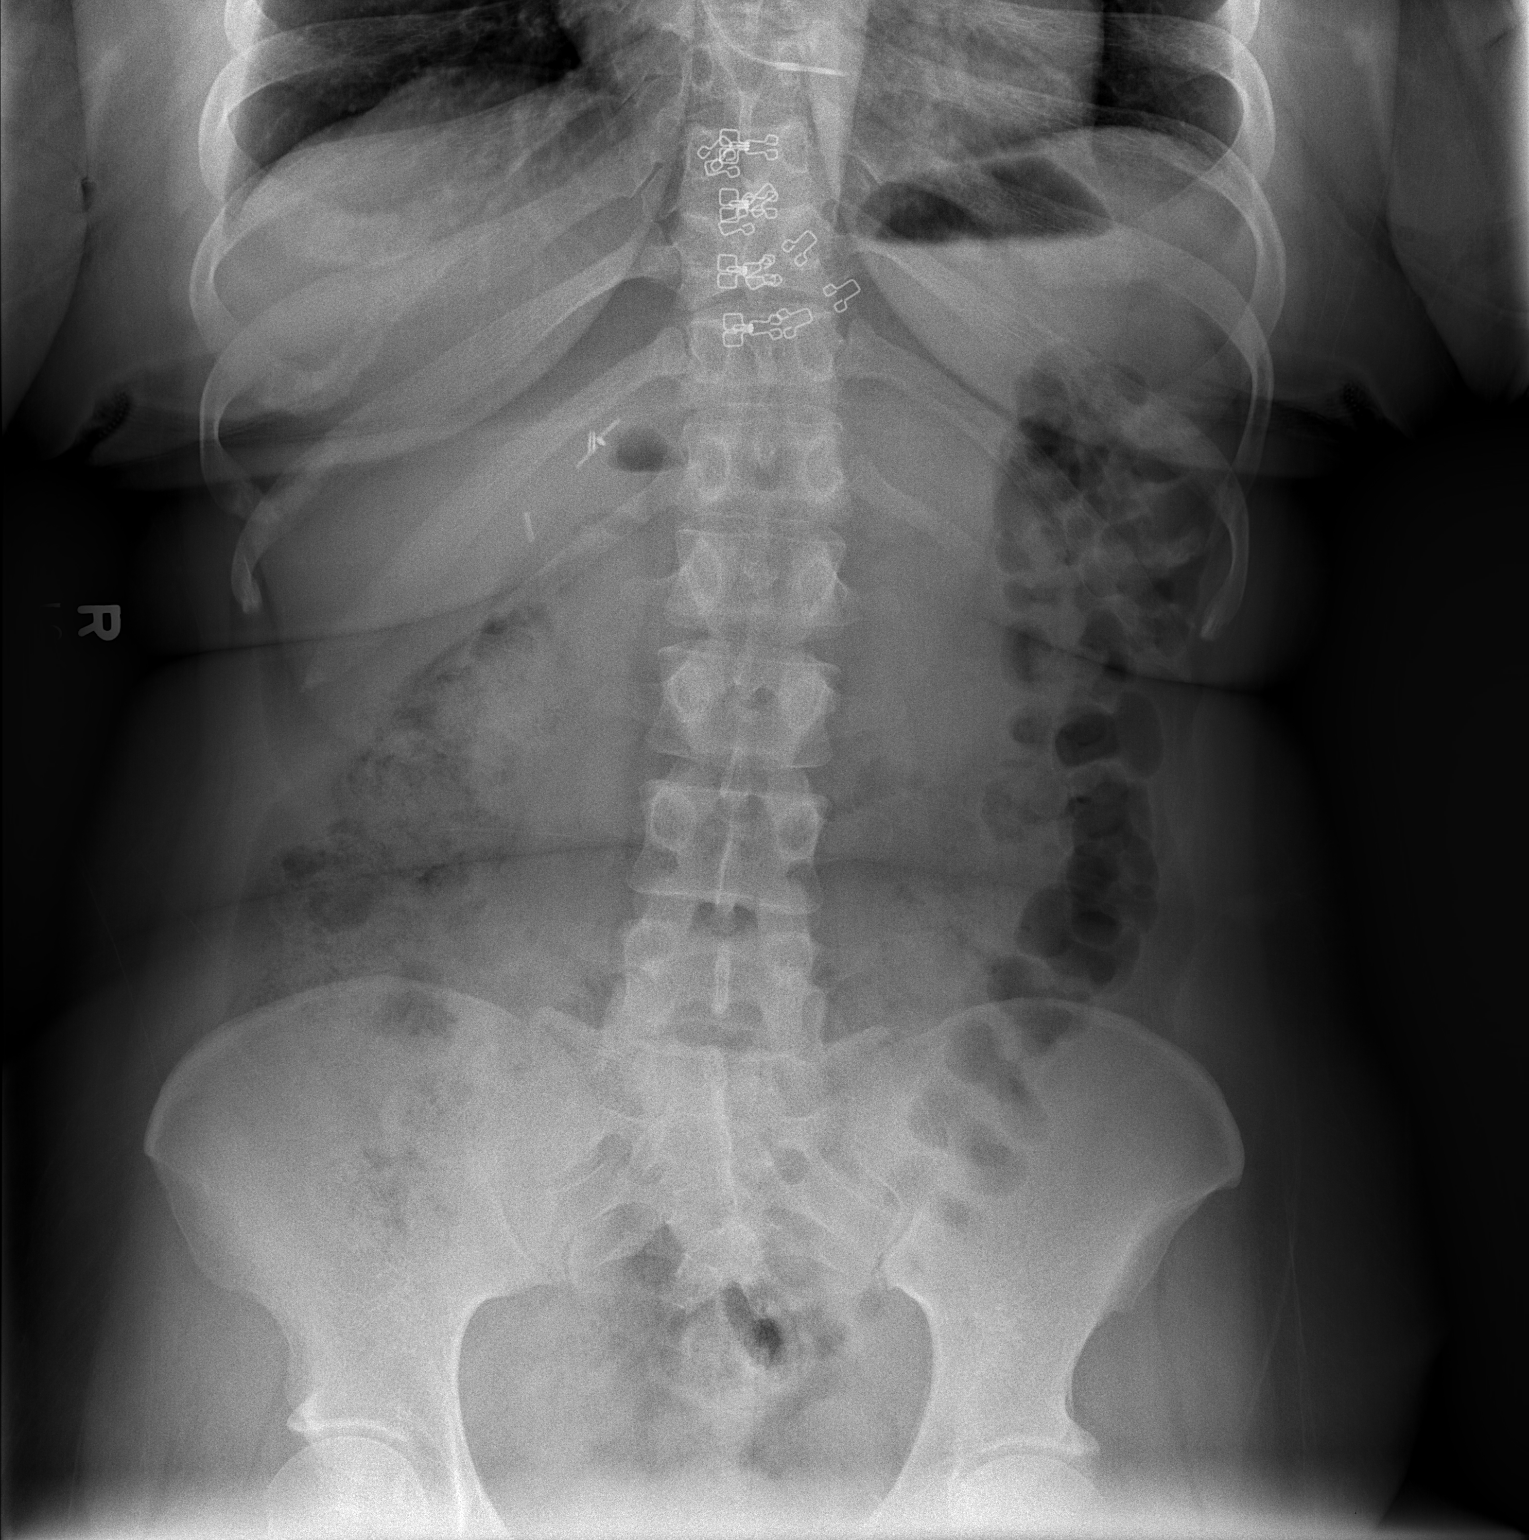

[t abdomen supine]
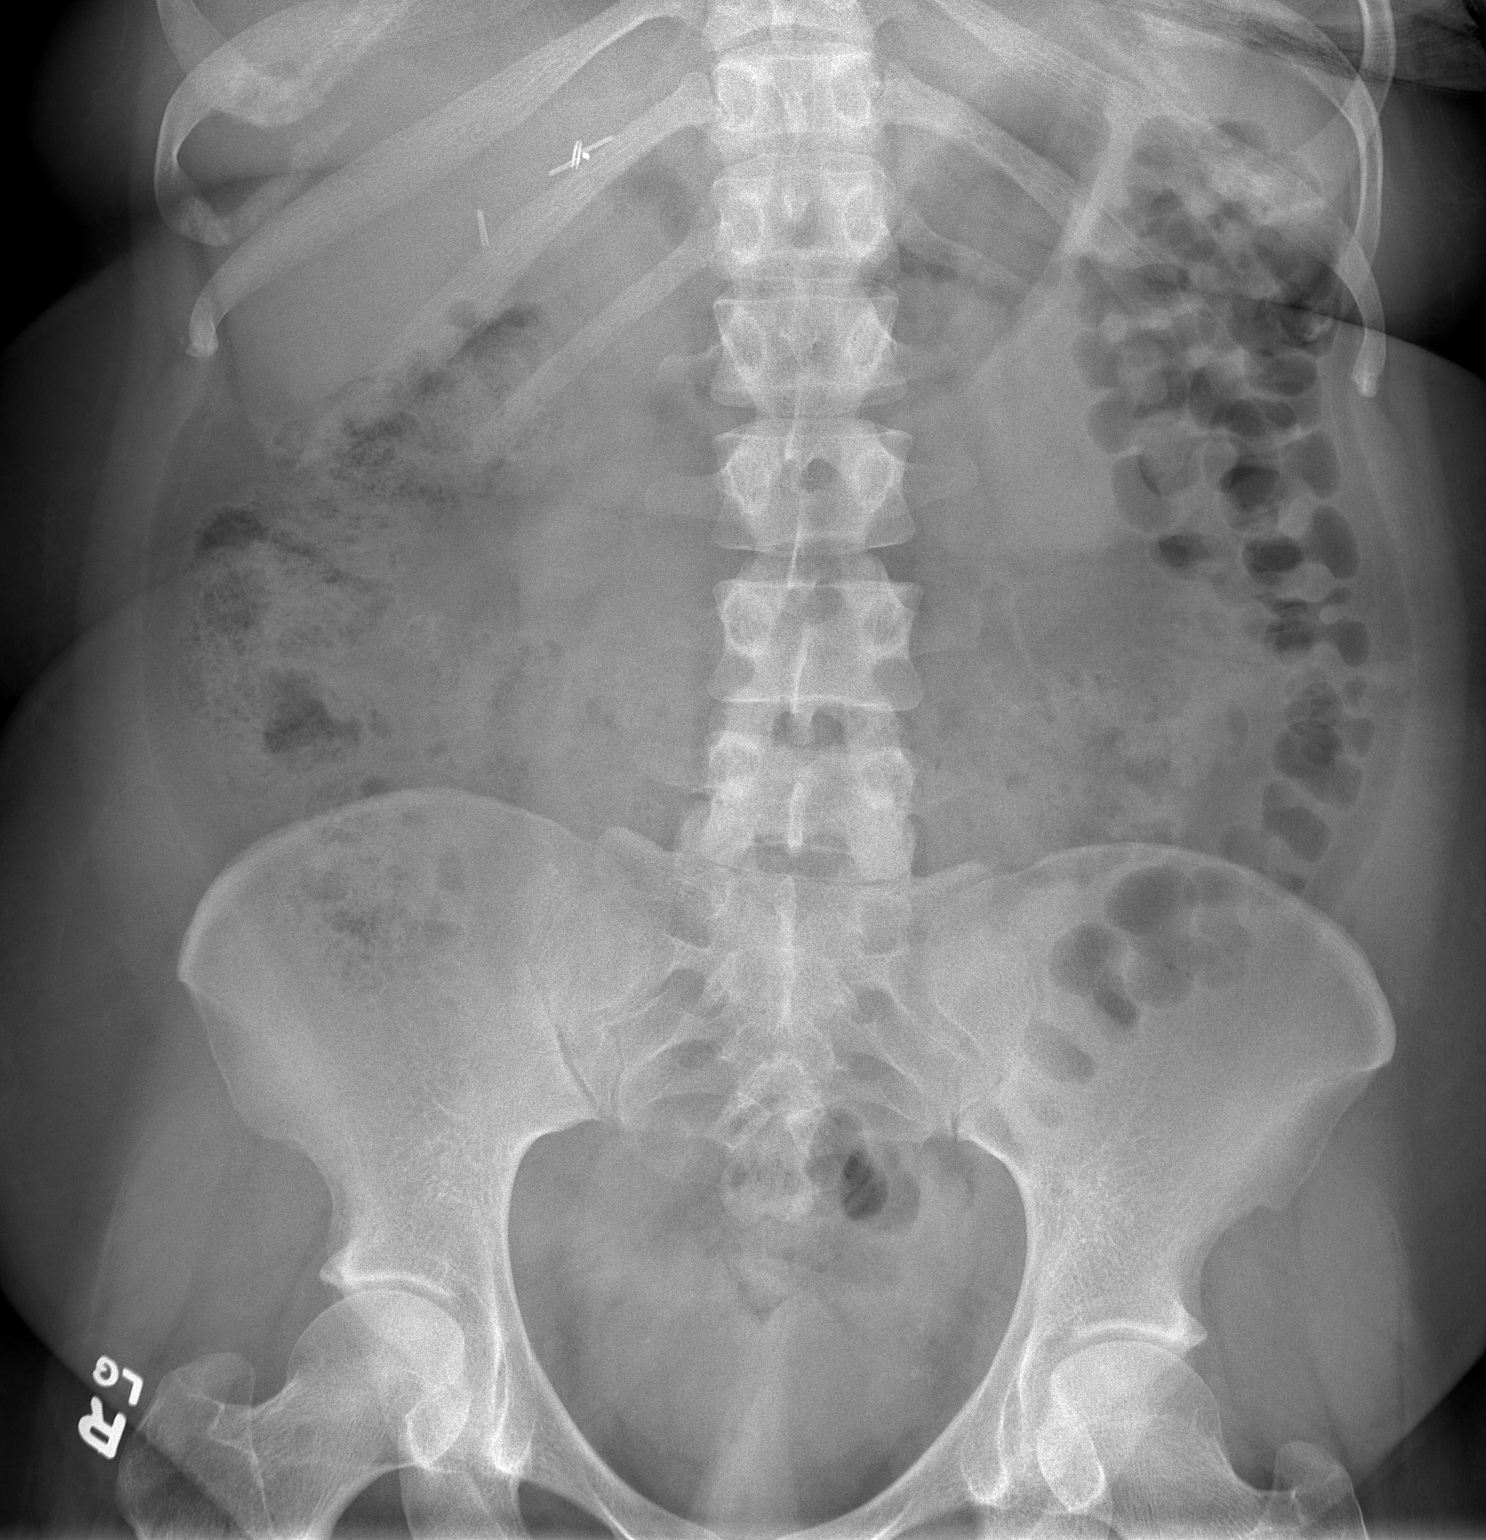

[2 of 2 positions shown; findings below may reference images not displayed]

FINDINGS: The bowel gas pattern is normal.  No free intraperitoneal
air.  The patient is status post cholecystectomy.  No focal bony
abnormality or unexpected calcification.
IMPRESSION: Negative exam.

## 2011-09-12 ENCOUNTER — Telehealth: Payer: Self-pay | Admitting: Internal Medicine

## 2011-09-12 NOTE — Telephone Encounter (Signed)
Fu call °Pt returning your call  °

## 2011-09-12 NOTE — Telephone Encounter (Signed)
Patient called ,states needs to schedule appointment to get pacemaker checked.States it has been almost 3 months and she is complaining of tiredness.

## 2011-09-16 ENCOUNTER — Encounter: Payer: Self-pay | Admitting: Internal Medicine

## 2011-09-16 ENCOUNTER — Ambulatory Visit (INDEPENDENT_AMBULATORY_CARE_PROVIDER_SITE_OTHER): Payer: Medicare Other | Admitting: Internal Medicine

## 2011-09-16 DIAGNOSIS — I499 Cardiac arrhythmia, unspecified: Secondary | ICD-10-CM

## 2011-09-16 DIAGNOSIS — I10 Essential (primary) hypertension: Secondary | ICD-10-CM

## 2011-09-16 DIAGNOSIS — Z950 Presence of cardiac pacemaker: Secondary | ICD-10-CM

## 2011-09-16 LAB — PACEMAKER DEVICE OBSERVATION
AL THRESHOLD: 0.5 V
BATTERY VOLTAGE: 2.67 V
RV LEAD AMPLITUDE: 5.6 mv

## 2011-09-16 NOTE — Assessment & Plan Note (Signed)
Her PPM is working normally today. Will plan to recheck in several months.

## 2011-09-16 NOTE — Assessment & Plan Note (Signed)
Her blood pressure is well controlled. She will continue her current meds and maintain a low sodium diet. 

## 2011-09-16 NOTE — Progress Notes (Signed)
HPI Makayla Vasquez returns today for followup. She is a very pleasant morbidly obese 30 year old woman with congenital complete heart block. She is status post permanent pacemaker insertion. She recently gave birth. The patient denies chest pain, shortness of breath, or peripheral edema. She and her baby are doing well. She denies syncope. Allergies  Allergen Reactions  . Sertraline Hcl     REACTION: hives     No current outpatient prescriptions on file.     Past Medical History  Diagnosis Date  . Heart palpitations   . Cardiac pacemaker     Since age 56. Last generator change in 2004  . Congenital complete AV block   . Bipolar affective disorder   . Obesity   . GERD (gastroesophageal reflux disease)   . Asymptomatic LV dysfunction     Echo in Dec 2011 with EF 35 to 40%. Felt to be due to paced rhythm    ROS:   All systems reviewed and negative except as noted in the HPI.   Past Surgical History  Procedure Date  . Throat surgery 1994    s/p laser treatment  . Cesarean section      Family History  Problem Relation Age of Onset  . Heart disease Mother   . Hypertension Mother   . Heart disease Father   . Heart disease Sister     cardiomegaly and atherosclerosis     History   Social History  . Marital Status: Single    Spouse Name: N/A    Number of Children: 1  . Years of Education: N/A   Occupational History  . CNA    Social History Main Topics  . Smoking status: Former Smoker    Types: Cigarettes  . Smokeless tobacco: Not on file  . Alcohol Use: No  . Drug Use: No  . Sexually Active: Yes    Birth Control/ Protection: IUD, Implant   Other Topics Concern  . Not on file   Social History Narrative  . No narrative on file     BP 114/78  Pulse 96  Ht 5\' 7"  (1.702 m)  Wt 100.154 kg (220 lb 12.8 oz)  BMI 34.58 kg/m2  Physical Exam:  Well appearing NAD HEENT: Unremarkable Neck:  No JVD, no thyromegally Lymphatics:  No adenopathy Back:  No CVA  tenderness Lungs:  Clear HEART:  Regular rate rhythm, no murmurs, no rubs, no clicks Abd:  soft, positive bowel sounds, no organomegally, no rebound, no guarding Ext:  2 plus pulses, no edema, no cyanosis, no clubbing Skin:  No rashes no nodules Neuro:  CN II through XII intact, motor grossly intact  EKG  DEVICE  Normal device function.  See PaceArt for details.   Assess/Plan:

## 2011-09-16 NOTE — Patient Instructions (Signed)
Your physician recommends that you schedule a follow-up appointment in: 2 months with the device clinic and 12 months with Dr Ladona Ridgel

## 2011-09-18 ENCOUNTER — Other Ambulatory Visit: Payer: Self-pay

## 2011-09-18 ENCOUNTER — Encounter (HOSPITAL_COMMUNITY): Payer: Self-pay | Admitting: Emergency Medicine

## 2011-09-18 ENCOUNTER — Emergency Department (INDEPENDENT_AMBULATORY_CARE_PROVIDER_SITE_OTHER)
Admission: EM | Admit: 2011-09-18 | Discharge: 2011-09-18 | Disposition: A | Discharge status: Home / Self Care | Payer: Medicaid Other | Source: Home / Self Care | Attending: Emergency Medicine | Admitting: Emergency Medicine

## 2011-09-18 DIAGNOSIS — R0789 Other chest pain: Secondary | ICD-10-CM

## 2011-09-18 LAB — POCT PREGNANCY, URINE: Preg Test, Ur: NEGATIVE

## 2011-09-18 MED ORDER — NAPROXEN 500 MG PO TABS: 500.0000 mg | ORAL_TABLET | Freq: Two times a day (BID) | ORAL | Status: DC

## 2011-09-18 NOTE — ED Notes (Signed)
Right side pain

## 2011-09-18 NOTE — ED Provider Notes (Signed)
History     CSN: [id number] Arrival date & time: 09/18/2011  7:44 PM   First MD Initiated Contact with Patient 09/18/11 1837      Chief Complaint  Patient presents with  . Flank Pain    HPI Comments: Pt with sharp nonradiating bilateral lower rib pain x 2 weeks lasting seconds and then resolving. Worse with deep inspiration, palpation. Not affected by movement, exertion. No N/V, cough, wheeze, SOB. No palpitations, diaphoresis, abd pain, urinary uregency, frequency, hematuria, oderous, cloudy urine, abd pain, vag discharge, abnormal vaginal bleeding. Just finished menses. States feels identical to pain she was seen for in ED on 10/11. ED workup including Tn negative.   Patient is a 30 y.o. female presenting with flank pain. The history is provided by the patient.  Flank Pain This is a recurrent problem. The current episode started more than 1 week ago. The problem has not changed since onset.Pertinent negatives include no chest pain, no abdominal pain and no shortness of breath. The symptoms are aggravated by coughing. The symptoms are relieved by nothing. She has tried nothing for the symptoms.    Past Medical History  Diagnosis Date  . Heart palpitations   . Cardiac pacemaker     Since age 79. Last generator change in 2004  . Congenital complete AV block   . Bipolar affective disorder   . Obesity   . GERD (gastroesophageal reflux disease)   . Asymptomatic LV dysfunction     Echo in Dec 2011 with EF 35 to 40%. Felt to be due to paced rhythm    Past Surgical History  Procedure Date  . Throat surgery 1994    s/p laser treatment  . Cesarean section     Family History  Problem Relation Age of Onset  . Heart disease Mother   . Hypertension Mother   . Heart disease Father   . Heart disease Sister     cardiomegaly and atherosclerosis    History  Substance Use Topics  . Smoking status: Former Smoker    Types: Cigarettes  . Smokeless tobacco: Not on file  . Alcohol Use:  No    OB History    Grav Para Term Preterm Abortions TAB SAB Ect Mult Living   1 1 1       1       Review of Systems  Constitutional: Negative for fever.  Respiratory: Negative for cough, shortness of breath and wheezing.   Cardiovascular: Negative for chest pain and palpitations.  Gastrointestinal: Negative for nausea, vomiting, abdominal pain, diarrhea and abdominal distention.  Genitourinary: Negative for dysuria, urgency, frequency, hematuria, flank pain and vaginal discharge.  Musculoskeletal: Negative for back pain.  Skin: Negative for rash.    Allergies  Sertraline hcl  Home Medications  No current outpatient prescriptions on file.  BP 130/57  Pulse 68  Temp(Src) 98.2 F (36.8 C) (Oral)  Resp 20  SpO2 99%  LMP 09/18/2011  Physical Exam  Nursing note and vitals reviewed. Constitutional: She is oriented to person, place, and time. She appears well-developed and well-nourished. No distress.  HENT:  Head: Normocephalic and atraumatic.  Eyes: EOM are normal. Pupils are equal, round, and reactive to light.  Neck: Normal range of motion.  Cardiovascular: Normal rate, regular rhythm and normal heart sounds.  Exam reveals no gallop and no friction rub.   No murmur heard. Pulmonary/Chest: Effort normal and breath sounds normal. She exhibits tenderness.       Bilateral lower rib tenderness midclavicular  line and mid axillary line. No rash.   Abdominal: Soft. Bowel sounds are normal. She exhibits no distension and no mass. There is no tenderness. There is no rebound and no guarding.  Musculoskeletal: Normal range of motion.       No cva tenderenss  Neurological: She is alert and oriented to person, place, and time.  Skin: Skin is warm and dry.  Psychiatric: She has a normal mood and affect. Her behavior is normal. Judgment and thought content normal.    ED Course  Procedures (including critical care time)   Labs Reviewed  POCT PREGNANCY, URINE  POCT URINALYSIS  DIPSTICK  POCT PREGNANCY, URINE   No results found.   No diagnosis found. ua not crossing over (+) for blood but pt currently with menses. Otherwise udip neg.   Results for orders placed during the hospital encounter of 09/18/11  POCT PREGNANCY, URINE      Component Value Range   Preg Test, Ur NEGATIVE      Date: 09/18/2011  Rate: 70   Rhythm: paced venricular rhythm  QRS Axis: left  Intervals: paced  ST/T Wave abnormalities: no changes from previous  Conduction Disutrbances: paced  Narrative Interpretation:   Old EKG Reviewed: unchanged from EKG 07/2011   MDM  Prev notes, labs reviewed. HP mst c/w msk CP vs costochondritis, will check EKG. If neg for acute changes will start high dose nsaids and have return in 48 hrs if no improvement.    Luiz Blare, MD 09/18/11 2200

## 2011-09-24 ENCOUNTER — Ambulatory Visit (INDEPENDENT_AMBULATORY_CARE_PROVIDER_SITE_OTHER): Payer: Medicare Other | Admitting: Obstetrics & Gynecology

## 2011-09-24 VITALS — BP 109/70 | HR 76 | Temp 97.8°F | Wt 217.8 lb

## 2011-09-24 DIAGNOSIS — Z975 Presence of (intrauterine) contraceptive device: Secondary | ICD-10-CM

## 2011-09-24 DIAGNOSIS — T8389XA Other specified complication of genitourinary prosthetic devices, implants and grafts, initial encounter: Secondary | ICD-10-CM

## 2011-09-24 NOTE — Progress Notes (Signed)
  Subjective:    Patient ID: Makayla Vasquez, female    DOB: [birth year], 30 y.o.   MRN: [id number]  HPI  She is here because of an IUD she wants removed. Dr. Henrietta Hoover and Dr. Candelaria Celeste have both attempted to remove it. An ultrasound shows it to be very low in the uterus. She agrees to let me try to remove it today before scheduling her for removal in the OR.  Review of Systems     Objective:   Physical Exam  I attempted without success to remove it. I used uterine dressing forceps and a J hook. She was not willing to procede with any other attempts in the office.      Assessment & Plan:  Retained IUD- I will schedule the removal in the OR. Please note that she has an Implanon in her arm.

## 2011-10-10 ENCOUNTER — Encounter (HOSPITAL_COMMUNITY): Payer: Self-pay | Admitting: Pharmacist

## 2011-10-10 ENCOUNTER — Encounter: Payer: Self-pay | Admitting: Internal Medicine

## 2011-10-15 ENCOUNTER — Encounter: Payer: Medicare Other | Admitting: Internal Medicine

## 2011-10-17 ENCOUNTER — Telehealth: Payer: Self-pay | Admitting: Family Medicine

## 2011-10-17 NOTE — Telephone Encounter (Signed)
Returned call to patient.  Has a pacemaker and wants to know if it is ok for her to take Tylenol Cold & Sinus for congestion.  Discussed with preceptor (Dr. Jennette Kettle) and informed ok to take medication.  Gaylene Brooks, RN

## 2011-10-17 NOTE — Telephone Encounter (Signed)
Has a question about what kind of Tylenol she can take.  Has a question for nurse

## 2011-10-24 ENCOUNTER — Ambulatory Visit (INDEPENDENT_AMBULATORY_CARE_PROVIDER_SITE_OTHER): Payer: Medicare Other | Admitting: Family Medicine

## 2011-10-24 ENCOUNTER — Encounter (HOSPITAL_COMMUNITY): Payer: Self-pay

## 2011-10-24 ENCOUNTER — Encounter (HOSPITAL_COMMUNITY)
Admission: RE | Admit: 2011-10-24 | Discharge: 2011-10-24 | Disposition: A | Discharge status: Home / Self Care | Payer: Medicare Other | Source: Ambulatory Visit | Attending: Obstetrics & Gynecology | Admitting: Obstetrics & Gynecology

## 2011-10-24 VITALS — BP 110/70 | Temp 98.2°F | Ht 67.0 in | Wt 221.0 lb

## 2011-10-24 DIAGNOSIS — J069 Acute upper respiratory infection, unspecified: Secondary | ICD-10-CM | POA: Insufficient documentation

## 2011-10-24 DIAGNOSIS — R0982 Postnasal drip: Secondary | ICD-10-CM

## 2011-10-24 HISTORY — DX: Unspecified convulsions: R56.9

## 2011-10-24 HISTORY — DX: Essential (primary) hypertension: I10

## 2011-10-24 HISTORY — DX: Major depressive disorder, single episode, unspecified: F32.9

## 2011-10-24 HISTORY — DX: Depression, unspecified: F32.A

## 2011-10-24 HISTORY — DX: Anxiety disorder, unspecified: F41.9

## 2011-10-24 LAB — CBC
HCT: 39.1 % (ref 36.0–46.0)
Platelets: 249 10*3/uL (ref 150–400)
RDW: 14.1 % (ref 11.5–15.5)
WBC: 3.7 10*3/uL — ABNORMAL LOW (ref 4.0–10.5)

## 2011-10-24 MED ORDER — CETIRIZINE HCL 10 MG PO TABS: 10.0000 mg | ORAL_TABLET | Freq: Every day | ORAL | Status: DC

## 2011-10-24 MED ORDER — FLUTICASONE PROPIONATE 50 MCG/ACT NA SUSP: 2.0000 | Freq: Every day | NASAL | Status: DC

## 2011-10-24 NOTE — Assessment & Plan Note (Signed)
Patient presents with symptoms consistent with viral URI.  Plan to treat symptomatically with Flonase and Zyrtec and Tylenol. Handout on colds given to patient. She expresses understanding.

## 2011-10-24 NOTE — Progress Notes (Signed)
Makayla Vasquez is a 31 year old woman who presents with one week of nasal congestion sore throat and cough that is nonproductive. She notes some chills but no fevers or dyspnea nausea vomiting or significant sick contacts.  She feels well otherwise and thinks that she has a postnasal drip. She has tried Tylenol.  PMH reviewed.  ROS as above otherwise neg Medications reviewed. Current Outpatient Prescriptions  Medication Sig Dispense Refill  . cetirizine (ZYRTEC) 10 MG tablet Take 1 tablet (10 mg total) by mouth daily.  30 tablet  11  . fluticasone (FLONASE) 50 MCG/ACT nasal spray Place 2 sprays into the nose daily.  16 g  6    Exam:  BP 110/70  Temp(Src) 98.2 F (36.8 C) (Oral)  Ht 5\' 7"  (1.702 m)  Wt 221 lb (100.245 kg)  BMI 34.61 kg/m2 Gen: Well NAD HEENT: EOMI,  MMM, posterior pharyngeal cobblestoning. Bilateral tympanic membrane retraction without erythema.  Boggy nasal turbinates bilaterally Lungs: CTABL Nl WOB Heart: RRR no MRG Abd: NABS, NT, ND Exts: , warm and well perfused.

## 2011-10-24 NOTE — Patient Instructions (Signed)
Thank you for coming in today. Please use the nasal spray and the allergy medicine.  Come back if you dont feel better by 2 weeks.    Upper Respiratory Infection, Adult An upper respiratory infection (URI) is also sometimes known as the common cold. The upper respiratory tract includes the nose, sinuses, throat, trachea, and bronchi. Bronchi are the airways leading to the lungs. Most people improve within 1 week, but symptoms can last up to 2 weeks. A residual cough may last even longer.   CAUSES Many different viruses can infect the tissues lining the upper respiratory tract. The tissues become irritated and inflamed and often become very moist. Mucus production is also common. A cold is contagious. You can easily spread the virus to others by oral contact. This includes kissing, sharing a glass, coughing, or sneezing. Touching your mouth or nose and then touching a surface, which is then touched by another person, can also spread the virus. SYMPTOMS   Symptoms typically develop 1 to 3 days after you come in contact with a cold virus. Symptoms vary from person to person. They may include:  Runny nose.     Sneezing.    Nasal congestion.     Sinus irritation.     Sore throat.     Loss of voice (laryngitis).     Cough.    Fatigue.    Muscle aches.     Loss of appetite.     Headache.    Low-grade fever.  DIAGNOSIS   You might diagnose your own cold based on familiar symptoms, since most people get a cold 2 to 3 times a year. Your caregiver can confirm this based on your exam. Most importantly, your caregiver can check that your symptoms are not due to another disease such as strep throat, sinusitis, pneumonia, asthma, or epiglottitis. Blood tests, throat tests, and X-rays are not necessary to diagnose a common cold, but they may sometimes be helpful in excluding other more serious diseases. Your caregiver will decide if any further tests are required. RISKS AND COMPLICATIONS   You  may be at risk for a more severe case of the common cold if you smoke cigarettes, have chronic heart disease (such as heart failure) or lung disease (such as asthma), or if you have a weakened immune system. The very young and very old are also at risk for more serious infections. Bacterial sinusitis, middle ear infections, and bacterial pneumonia can complicate the common cold. The common cold can worsen asthma and chronic obstructive pulmonary disease (COPD). Sometimes, these complications can require emergency medical care and may be life-threatening. PREVENTION   The best way to protect against getting a cold is to practice good hygiene. Avoid oral or hand contact with people with cold symptoms. Wash your hands often if contact occurs. There is no clear evidence that vitamin C, vitamin E, echinacea, or exercise reduces the chance of developing a cold. However, it is always recommended to get plenty of rest and practice good nutrition. TREATMENT   Treatment is directed at relieving symptoms. There is no cure. Antibiotics are not effective, because the infection is caused by a virus, not by bacteria. Treatment may include:  Increased fluid intake. Sports drinks offer valuable electrolytes, sugars, and fluids.     Breathing heated mist or steam (vaporizer or shower).     Eating chicken soup or other clear broths, and maintaining good nutrition.     Getting plenty of rest.     Using gargles  or lozenges for comfort.     Controlling fevers with ibuprofen or acetaminophen as directed by your caregiver.     Increasing usage of your inhaler if you have asthma.  Zinc gel and zinc lozenges, taken in the first 24 hours of the common cold, can shorten the duration and lessen the severity of symptoms. Pain medicines may help with fever, muscle aches, and throat pain. A variety of non-prescription medicines are available to treat congestion and runny nose. Your caregiver can make recommendations and may  suggest nasal or lung inhalers for other symptoms.   HOME CARE INSTRUCTIONS    Only take over-the-counter or prescription medicines for pain, discomfort, or fever as directed by your caregiver.     Use a warm mist humidifier or inhale steam from a shower to increase air moisture. This may keep secretions moist and make it easier to breathe.     Drink enough water and fluids to keep your urine clear or pale yellow.     Rest as needed.     Return to work when your temperature has returned to normal or as your caregiver advises. You may need to stay home longer to avoid infecting others. You can also use a face mask and careful hand washing to prevent spread of the virus.  SEEK MEDICAL CARE IF:    After the first few days, you feel you are getting worse rather than better.     You need your caregiver's advice about medicines to control symptoms.     You develop chills, worsening shortness of breath, or brown or red sputum. These may be signs of pneumonia.     You develop yellow or brown nasal discharge or pain in the face, especially when you bend forward. These may be signs of sinusitis.     You develop a fever, swollen neck glands, pain with swallowing, or white areas in the back of your throat. These may be signs of strep throat.  SEEK IMMEDIATE MEDICAL CARE IF:    You have a fever.     You develop severe or persistent headache, ear pain, sinus pain, or chest pain.     You develop wheezing, a prolonged cough, cough up blood, or have a change in your usual mucus (if you have chronic lung disease).     You develop sore muscles or a stiff neck.  Document Released: 04/01/2001 Document Revised: 06/18/2011 Document Reviewed: 02/07/2011 [facility] Patient Information 2012 [place], [place].

## 2011-10-24 NOTE — Patient Instructions (Signed)
YOUR PROCEDURE IS SCHEDULED ON:11/03/11  ENTER THROUGH THE MAIN ENTRANCE OF [facility] AT:2:45 pm  USE DESK PHONE AND DIAL [id number] TO INFORM us OF YOUR ARRIVAL  CALL [phone] IF YOU HAVE ANY QUESTIONS OR PROBLEMS PRIOR TO YOUR ARRIVAL.  REMEMBER: DO NOT EAT AFTER MIDNIGHT : sunday  SPECIAL INSTRUCTIONS:clear liquids ok until 12 noon on Monday   YOU MAY BRUSH YOUR TEETH THE MORNING OF SURGERY   TAKE THESE MEDICINES THE DAY OF SURGERY WITH SIP OF WATER:   DO NOT WEAR JEWELRY, EYE MAKEUP, LIPSTICK OR DARK FINGERNAIL POLISH DO NOT WEAR LOTIONS  DO NOT SHAVE FOR 48 HOURS PRIOR TO SURGERY  YOU WILL NOT BE ALLOWED TO DRIVE YOURSELF HOME.  NAME OF DRIVER:Christopher

## 2011-10-29 NOTE — Pre-Procedure Instructions (Signed)
Attempts were made to get pt's cardiologist to send back to [place] the perioperative orders for ICD. Order sheet faxed to them on 10/24/11. Several phones calls were made since then but to no avail. Dr. Malen Gauze notified.

## 2011-11-03 ENCOUNTER — Encounter (HOSPITAL_COMMUNITY): Payer: Self-pay | Admitting: Anesthesiology

## 2011-11-03 ENCOUNTER — Encounter (HOSPITAL_COMMUNITY)
Admission: RE | Disposition: A | Discharge status: Home / Self Care | Payer: Self-pay | Source: Ambulatory Visit | Attending: Obstetrics & Gynecology

## 2011-11-03 ENCOUNTER — Encounter (HOSPITAL_COMMUNITY): Payer: Self-pay | Admitting: *Deleted

## 2011-11-03 ENCOUNTER — Ambulatory Visit (HOSPITAL_COMMUNITY)
Admission: RE | Admit: 2011-11-03 | Discharge: 2011-11-03 | Disposition: A | Discharge status: Home / Self Care | Payer: Medicare Other | Source: Ambulatory Visit | Attending: Obstetrics & Gynecology | Admitting: Obstetrics & Gynecology

## 2011-11-03 ENCOUNTER — Ambulatory Visit (HOSPITAL_COMMUNITY): Payer: Medicare Other | Admitting: Anesthesiology

## 2011-11-03 DIAGNOSIS — Z30432 Encounter for removal of intrauterine contraceptive device: Secondary | ICD-10-CM | POA: Insufficient documentation

## 2011-11-03 DIAGNOSIS — Z01818 Encounter for other preprocedural examination: Secondary | ICD-10-CM | POA: Insufficient documentation

## 2011-11-03 HISTORY — PX: IUD REMOVAL: SHX5392

## 2011-11-03 SURGERY — REMOVAL, INTRAUTERINE DEVICE
Anesthesia: Monitor Anesthesia Care | Site: Vagina | Wound class: Clean Contaminated

## 2011-11-03 MED ORDER — LIDOCAINE HCL (CARDIAC) 20 MG/ML IV SOLN
INTRAVENOUS | Status: AC
  Filled 2011-11-03: qty 5

## 2011-11-03 MED ORDER — BUPIVACAINE HCL (PF) 0.5 % IJ SOLN
INTRAMUSCULAR | Status: DC | PRN
  Administered 2011-11-03: 30 mL

## 2011-11-03 MED ORDER — DEXAMETHASONE SODIUM PHOSPHATE 10 MG/ML IJ SOLN
INTRAMUSCULAR | Status: AC
  Filled 2011-11-03: qty 1

## 2011-11-03 MED ORDER — 0.9 % SODIUM CHLORIDE (POUR BTL) OPTIME
TOPICAL | Status: DC | PRN
  Administered 2011-11-03: 1000 mL

## 2011-11-03 MED ORDER — MIDAZOLAM HCL 5 MG/5ML IJ SOLN
INTRAMUSCULAR | Status: DC | PRN
  Administered 2011-11-03: 2 mg via INTRAVENOUS

## 2011-11-03 MED ORDER — DEXTROSE IN LACTATED RINGERS 5 % IV SOLN: INTRAVENOUS | Status: DC

## 2011-11-03 MED ORDER — PROPOFOL 10 MG/ML IV EMUL
INTRAVENOUS | Status: DC | PRN
  Administered 2011-11-03: 20 mg via INTRAVENOUS

## 2011-11-03 MED ORDER — FENTANYL CITRATE 0.05 MG/ML IJ SOLN: 25.0000 ug | INTRAMUSCULAR | Status: DC | PRN

## 2011-11-03 MED ORDER — METOCLOPRAMIDE HCL 5 MG/ML IJ SOLN: 10.0000 mg | Freq: Once | INTRAMUSCULAR | Status: DC | PRN

## 2011-11-03 MED ORDER — MIDAZOLAM HCL 2 MG/2ML IJ SOLN
INTRAMUSCULAR | Status: AC
  Filled 2011-11-03: qty 2

## 2011-11-03 MED ORDER — LIDOCAINE HCL (CARDIAC) 20 MG/ML IV SOLN
INTRAVENOUS | Status: DC | PRN
  Administered 2011-11-03: 60 mg via INTRAVENOUS

## 2011-11-03 MED ORDER — PROPOFOL 10 MG/ML IV EMUL
INTRAVENOUS | Status: AC
  Filled 2011-11-03: qty 20

## 2011-11-03 MED ORDER — FENTANYL CITRATE 0.05 MG/ML IJ SOLN
INTRAMUSCULAR | Status: AC
  Filled 2011-11-03: qty 5

## 2011-11-03 MED ORDER — ONDANSETRON HCL 4 MG/2ML IJ SOLN
INTRAMUSCULAR | Status: DC | PRN
  Administered 2011-11-03: 4 mg via INTRAVENOUS

## 2011-11-03 MED ORDER — ONDANSETRON HCL 4 MG/2ML IJ SOLN
INTRAMUSCULAR | Status: AC
  Filled 2011-11-03: qty 2

## 2011-11-03 MED ORDER — MEPERIDINE HCL 25 MG/ML IJ SOLN: 6.2500 mg | INTRAMUSCULAR | Status: DC | PRN

## 2011-11-03 MED ORDER — IBUPROFEN 600 MG PO TABS: 600.0000 mg | ORAL_TABLET | Freq: Four times a day (QID) | ORAL | Status: AC | PRN

## 2011-11-03 MED ORDER — FENTANYL CITRATE 0.05 MG/ML IJ SOLN
INTRAMUSCULAR | Status: DC | PRN
  Administered 2011-11-03: 100 ug via INTRAVENOUS

## 2011-11-03 MED ORDER — DEXAMETHASONE SODIUM PHOSPHATE 10 MG/ML IJ SOLN
INTRAMUSCULAR | Status: DC | PRN
  Administered 2011-11-03: 10 mg via INTRAVENOUS

## 2011-11-03 MED ORDER — LACTATED RINGERS IV SOLN
INTRAVENOUS | Status: DC
  Administered 2011-11-03: 16:00:00 via INTRAVENOUS
  Administered 2011-11-03: 1000 mL/h via INTRAVENOUS

## 2011-11-03 SURGICAL SUPPLY — 11 items
CATH ROBINSON RED A/P 16FR (CATHETERS) ×2 IMPLANT
CLOTH BEACON ORANGE TIMEOUT ST (SAFETY) ×2 IMPLANT
GLOVE BIO SURGEON STRL SZ 6.5 (GLOVE) ×4 IMPLANT
GOWN PREVENTION PLUS LG XLONG (DISPOSABLE) ×4 IMPLANT
NDL SPNL 22GX3.5 QUINCKE BK (NEEDLE) ×1 IMPLANT
NEEDLE SPNL 22GX3.5 QUINCKE BK (NEEDLE) ×2 IMPLANT
PACK VAGINAL MINOR WOMEN LF (CUSTOM PROCEDURE TRAY) ×2 IMPLANT
PAD PREP 24X48 CUFFED NSTRL (MISCELLANEOUS) ×2 IMPLANT
SYR CONTROL 10ML LL (SYRINGE) ×2 IMPLANT
TOWEL OR 17X24 6PK STRL BLUE (TOWEL DISPOSABLE) ×4 IMPLANT
WATER STERILE IRR 1000ML POUR (IV SOLUTION) ×2 IMPLANT

## 2011-11-03 NOTE — Anesthesia Preprocedure Evaluation (Signed)
Anesthesia Evaluation  Patient identified by MRN, date of birth, ID band Patient awake    Reviewed: Allergy & Precautions, H&P , NPO status , Patient's Chart, lab work & pertinent test results  Airway Mallampati: III TM Distance: >3 FB Neck ROM: Full    Dental No notable dental hx. (+) Chipped,    Pulmonary asthma ,  Recent URI clear to auscultation  Pulmonary exam normal       Cardiovascular hypertension, + dysrhythmias + pacemaker Regular Normal Congenital A-V Block, S/P Permanent Pacemaker insertion at age 31.   Neuro/Psych Anxiety Depression Negative Neurological ROS     GI/Hepatic Neg liver ROS, GERD-  Controlled,Irritable Bowel Syndrome   Endo/Other  Morbid obesity  Renal/GU negative Renal ROS  Genitourinary negative   Musculoskeletal negative musculoskeletal ROS (+)   Abdominal (+) obese,  Abdomen: soft.    Peds  Hematology negative hematology ROS (+)   Anesthesia Other Findings   Reproductive/Obstetrics negative OB ROS                           Anesthesia Physical Anesthesia Plan  ASA: III  Anesthesia Plan: MAC   Post-op Pain Management:    Induction: Intravenous  Airway Management Planned:   Additional Equipment:   Intra-op Plan:   Post-operative Plan:   Informed Consent: I have reviewed the patients History and Physical, chart, labs and discussed the procedure including the risks, benefits and alternatives for the proposed anesthesia with the patient or authorized representative who has indicated his/her understanding and acceptance.   Dental advisory given  Plan Discussed with: CRNA, Anesthesiologist and Surgeon  Anesthesia Plan Comments:         Anesthesia Quick Evaluation

## 2011-11-03 NOTE — H&P (Signed)
Makayla Vasquez is an 31 y.o. female. She would like her Mirena IUD removed. Three doctors have tried without success, including me. She already has a Implanon in her arm since July 2012.  Pertinent Gynecological History: Menses:DUB  Bleeding: dysfunctional uterine bleeding Contraception:  DES exposure: denies Blood transfusions: none Sexually transmitted diseases: chlamydia and gonorrhea Previous GYN Procedures: none  Last mammogram: n/a Date:  Last pap: normal Date: 2012 OB History: G1, P1   Menstrual History: Menarche age: 62 Patient's last menstrual period was 09/24/2011.    Past Medical History  Diagnosis Date  . Heart palpitations   . Cardiac pacemaker     Since age 4. Last generator change in 2004  . Congenital complete AV block   . Obesity   . GERD (gastroesophageal reflux disease)   . Asymptomatic LV dysfunction     Echo in Dec 2011 with EF 35 to 40%. Felt to be due to paced rhythm  . Seizures     as a child- from high fever  . Anxiety   . Hypertension   . Bipolar affective disorder   . Depression     bipolar    Past Surgical History  Procedure Date  . Throat surgery 1994    s/p laser treatment  . Cesarean section   . Insert / replace / remove pacemaker     2001  . Cholecystectomy     Family History  Problem Relation Age of Onset  . Heart disease Mother   . Hypertension Mother   . Heart disease Father   . Heart disease Sister     cardiomegaly and atherosclerosis    Social History:  reports that she has quit smoking. Her smoking use included Cigarettes. She does not have any smokeless tobacco history on file. She reports that she does not drink alcohol or use illicit drugs.  Allergies:  Allergies  Allergen Reactions  . Sertraline Hcl     REACTION: hives    Prescriptions prior to admission  Medication Sig Dispense Refill  . cetirizine (ZYRTEC) 10 MG tablet Take 1 tablet (10 mg total) by mouth daily.  30 tablet  11  . fluticasone (FLONASE) 50  MCG/ACT nasal spray Place 2 sprays into the nose daily.  16 g  6    Review of Systems  HENT: Neck pain: Implanon.    Sexually active Blood pressure 115/64, pulse 64, temperature 98.2 F (36.8 C), temperature source Oral, resp. rate 16, height 5\' 6"  (1.676 m), weight 99.791 kg (220 lb), last menstrual period 09/24/2011, SpO2 100.00%. Physical Exam Heart- rrr Lungs- CTAB Results for orders placed during the hospital encounter of 11/03/11 (from the past 24 hour(s))  PREGNANCY, URINE     Status: Normal   Collection Time   11/03/11  2:35 PM      Component Value Range   Preg Test, Ur NEGATIVE      No results found.  Assessment/Plan: Retained IUD- I will remove it in the OR.  Alvis Edgell C. 11/03/2011, 3:49 PM

## 2011-11-03 NOTE — Transfer of Care (Signed)
Immediate Anesthesia Transfer of Care Note  Patient: Makayla Vasquez  Procedure(s) Performed:  INTRAUTERINE DEVICE (IUD) REMOVAL  Patient Location: PACU  Anesthesia Type: MAC  Level of Consciousness: awake and sedated  Airway & Oxygen Therapy: Patient Spontanous Breathing  Post-op Assessment: Report given to PACU RN  Post vital signs: Reviewed and stable Filed Vitals:   11/03/11 1503  BP: 115/64  Pulse: 64  Temp: 36.8 C  Resp: 16    Complications: No apparent anesthesia complications

## 2011-11-03 NOTE — Anesthesia Postprocedure Evaluation (Signed)
  Anesthesia Post-op Note  Patient: Makayla Vasquez  Procedure(s) Performed:  INTRAUTERINE DEVICE (IUD) REMOVAL  Patient Location: PACU  Anesthesia Type: MAC  Level of Consciousness: awake, alert  and oriented  Airway and Oxygen Therapy: Patient Spontanous Breathing  Post-op Pain: none  Post-op Assessment: Post-op Vital signs reviewed, Patient's Cardiovascular Status Stable, Respiratory Function Stable, Patent Airway, No signs of Nausea or vomiting and Pain level controlled  Post-op Vital Signs: Reviewed and stable  Complications: No apparent anesthesia complications

## 2011-11-03 NOTE — Op Note (Signed)
11/03/2011  4:18 PM  PATIENT:  Makayla Vasquez  31 y.o. female  PRE-OPERATIVE DIAGNOSIS:  Encounter for IUD removal [V25.12], retained IUD  POST-OPERATIVE DIAGNOSIS:  same  PROCEDURE:  Procedure(s): INTRAUTERINE DEVICE (IUD) REMOVAL  SURGEON:  Surgeon(s): Irving Bloor C. Marice Potter, MD  PHYSICIAN ASSISTANT:   ASSISTANTS: none   ANESTHESIA:   MAC  EBL:  Total I/O In: 1000 [I.V.:1000] Out: -   BLOOD ADMINISTERED:none  DRAINS: none   LOCAL MEDICATIONS USED:  MARCAINE 10 CC  SPECIMEN:  No Specimen  DISPOSITION OF SPECIMEN:  N/A  COUNTS:  YES  TOURNIQUET:  * No tourniquets in log *  DICTATION: .Dragon Dictation  PLAN OF CARE: Discharge to home after PACU  PATIENT DISPOSITION:  PACU - hemodynamically stable.   Delay start of Pharmacological VTE agent (>24hrs) due to surgical blood loss or risk of bleeding:   The risks and benefits of surgery were explained, understood, and accepted. In the operating room, MAC anethesia was given after she was in the dorsal lithotomy position. Her vagina was prepped and draped in the usual fashion. Her bladder was emptied with a Robinson catheter. A Graves speculum was placed. A paracervical block was given with 0.5% Marcaine. I used a serrated currette to remove the IUD. This was very easily done. She was taken to the recovery room in stable condition.

## 2011-11-05 ENCOUNTER — Encounter (HOSPITAL_COMMUNITY): Payer: Self-pay | Admitting: Obstetrics & Gynecology

## 2011-11-17 ENCOUNTER — Ambulatory Visit (INDEPENDENT_AMBULATORY_CARE_PROVIDER_SITE_OTHER): Payer: Medicare Other | Admitting: *Deleted

## 2011-11-17 ENCOUNTER — Encounter: Payer: Self-pay | Admitting: Internal Medicine

## 2011-11-17 DIAGNOSIS — I459 Conduction disorder, unspecified: Secondary | ICD-10-CM

## 2011-11-17 LAB — PACEMAKER DEVICE OBSERVATION
ATRIAL PACING PM: 33.9
VENTRICULAR PACING PM: 99.7

## 2011-11-17 NOTE — Progress Notes (Signed)
PPM battery check only. 

## 2011-11-19 ENCOUNTER — Ambulatory Visit: Payer: Medicare Other | Admitting: Family Medicine

## 2011-12-11 ENCOUNTER — Ambulatory Visit (INDEPENDENT_AMBULATORY_CARE_PROVIDER_SITE_OTHER): Payer: Medicare Other | Admitting: Obstetrics & Gynecology

## 2011-12-11 ENCOUNTER — Encounter: Payer: Self-pay | Admitting: Obstetrics & Gynecology

## 2011-12-11 VITALS — BP 108/74 | HR 67 | Temp 96.9°F | Ht 65.5 in | Wt 222.3 lb

## 2011-12-11 DIAGNOSIS — Z09 Encounter for follow-up examination after completed treatment for conditions other than malignant neoplasm: Secondary | ICD-10-CM

## 2011-12-11 NOTE — Progress Notes (Signed)
  Subjective:    Patient ID: Makayla Vasquez, female    DOB: 02-23-1981, 30 y.o.   MRN: [id number]  HPI  Ms. Blizzard is here today for a post op check after having her IUD removed in the OR due to inability to have it removed in the office.She complains of some vaginal/pelvic pain that is not severe enough for her take a Tylenol or IBU (which she has at home). She already has the Nexplanon in for birth control.   Review of Systems    pap due 3/13 Objective:   Physical Exam  Speculum exam normal, normal discharge Bimanual exam reveals no masses, slight tenderness with deep palpation.      Assessment & Plan:  Post op stable Pelvic pain- I have suggested that she take IBU prn and I will reevaluate her next month with her annual exam. She should come back sooner if she develops fevers/worsening pain.

## 2011-12-14 IMAGING — CR DG CHEST 2V
2 series · 2 of 2 positions shown · non-contrast
Comparison: 12/04/2007 and earlier.

CLINICAL DATA: 28-year-old female with sudden mid chest pain.

CHEST - 2 VIEW

[w chest pa]
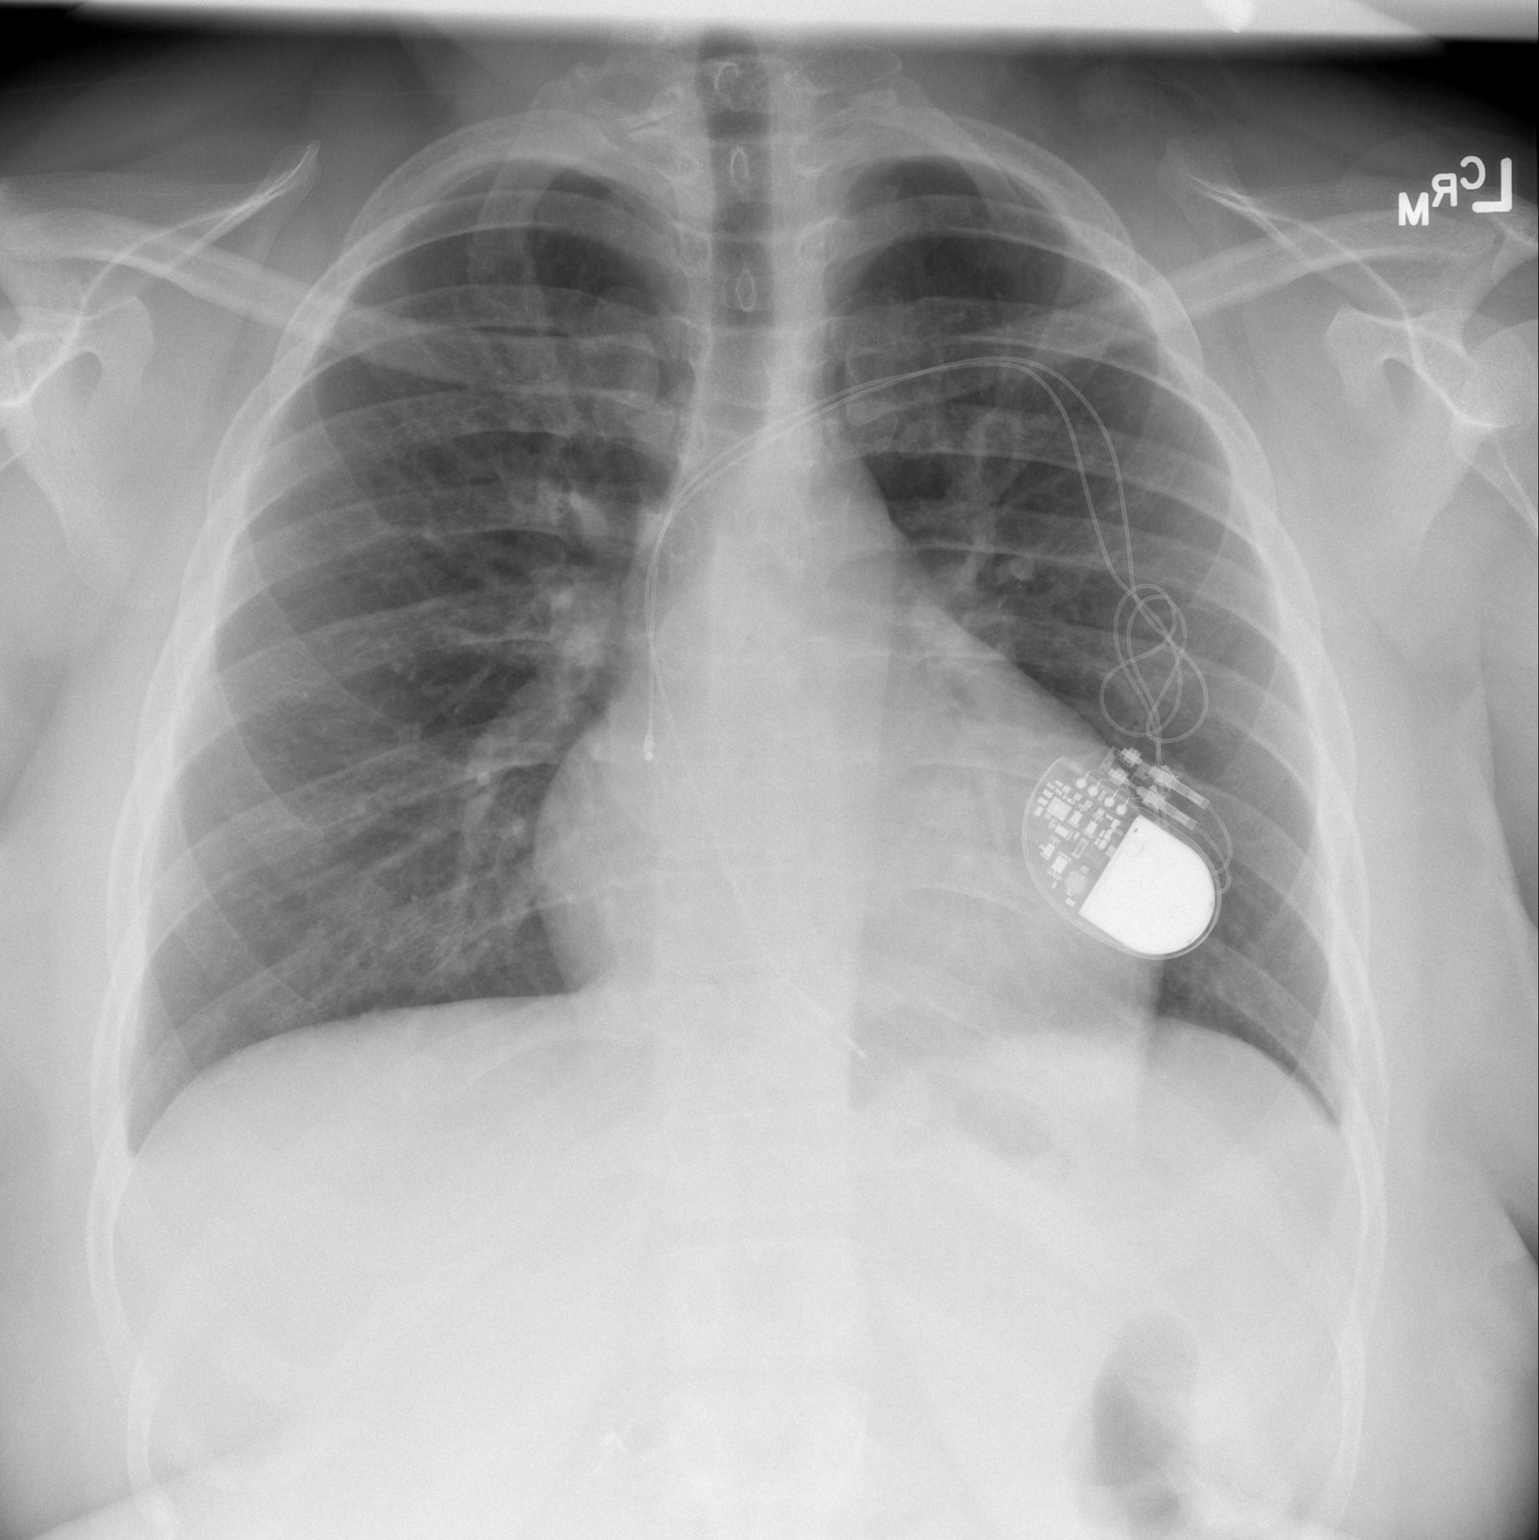

[w chest lat]
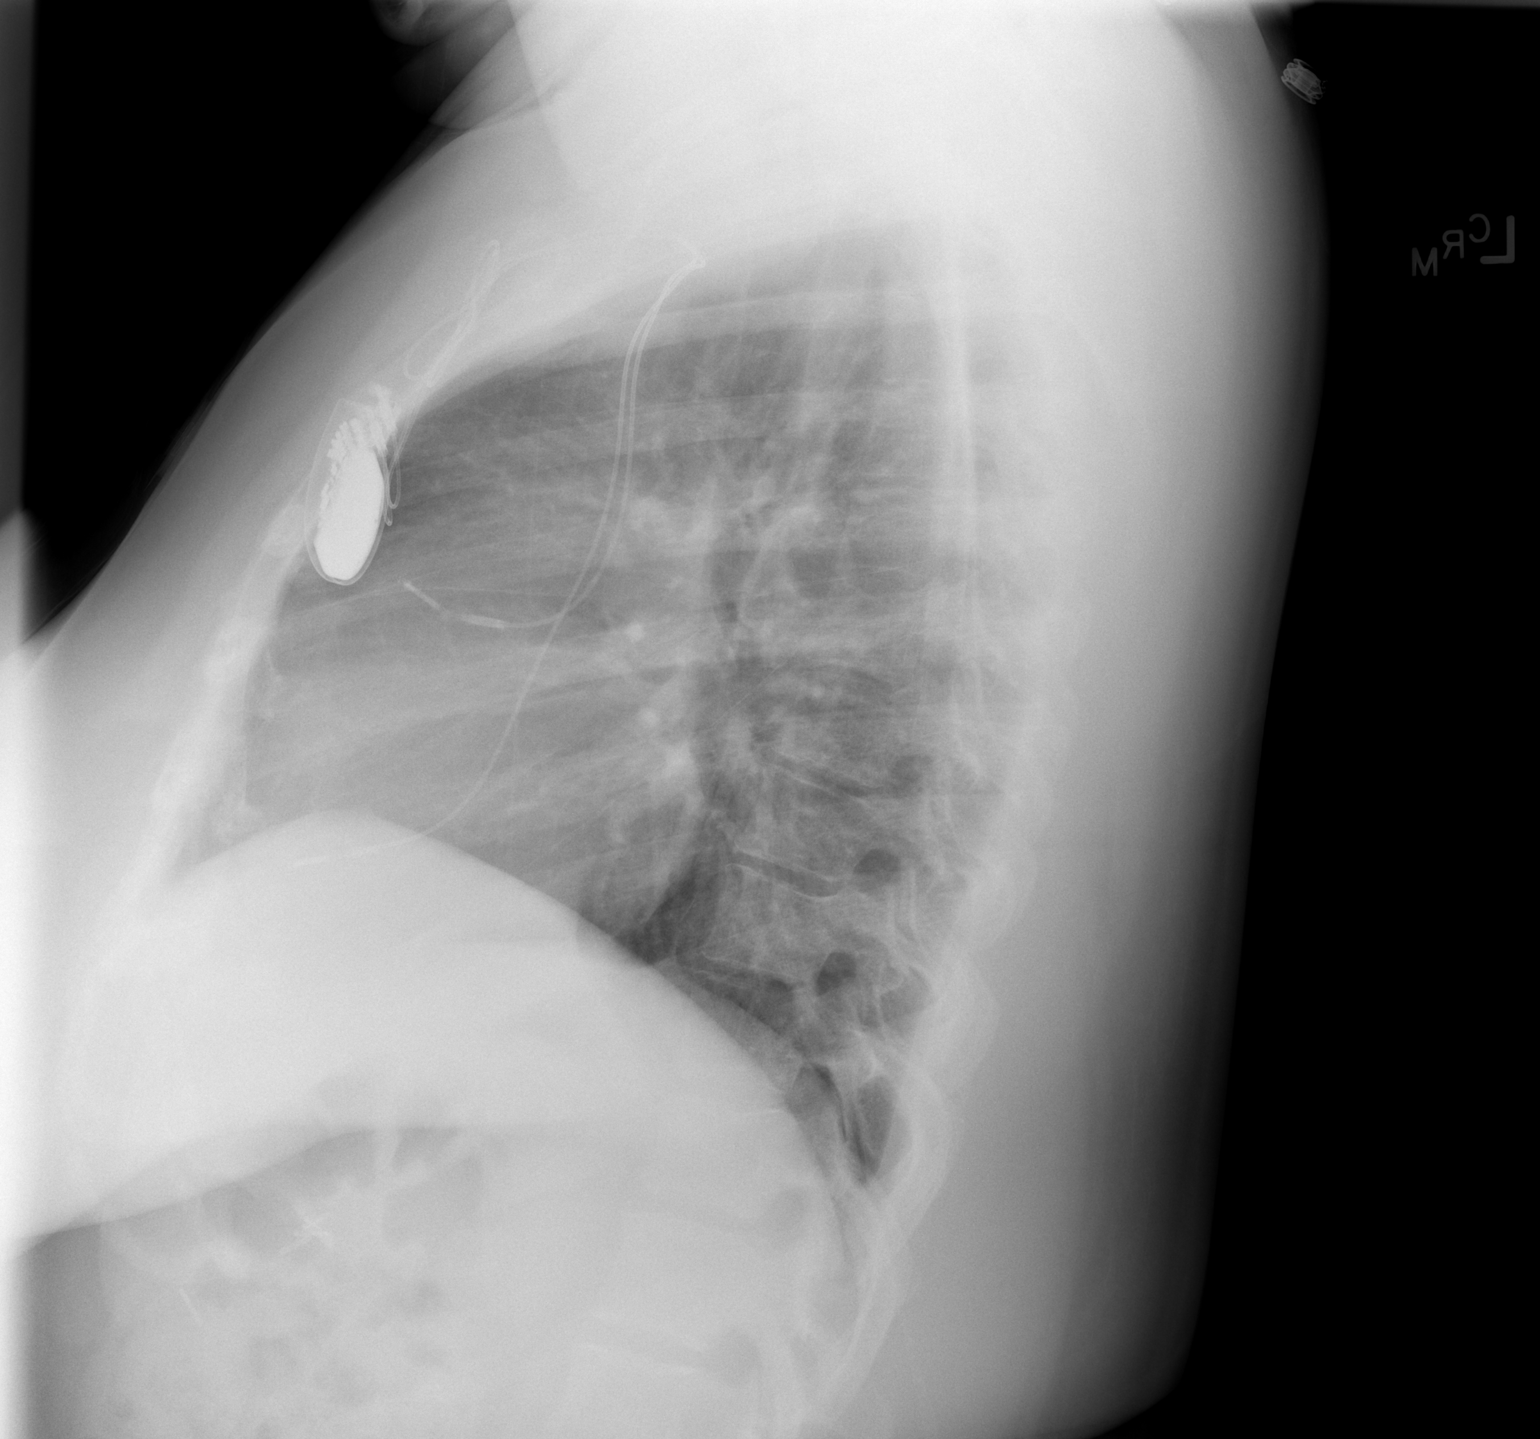

[2 of 2 positions shown; findings below may reference images not displayed]

FINDINGS: Stable left chest dual lead cardiac pacemaker. Stable
cardiomegaly and mediastinal contours.  Lung volumes within normal
limits.  No pneumothorax, pulmonary edema, pleural effusion or
acute airspace opacity. Visualized tracheal air column is within
normal limits.  Surgical clips in the right upper quadrant. No
acute osseous abnormality identified.  Small cervical ribs
incidentally noted.
IMPRESSION: No acute cardiopulmonary abnormality.

## 2011-12-25 ENCOUNTER — Ambulatory Visit (INDEPENDENT_AMBULATORY_CARE_PROVIDER_SITE_OTHER): Payer: Medicare Other | Admitting: Family Medicine

## 2011-12-25 ENCOUNTER — Encounter: Payer: Self-pay | Admitting: Family Medicine

## 2011-12-25 VITALS — BP 108/70 | HR 68 | Temp 98.2°F | Ht 66.0 in | Wt 222.0 lb

## 2011-12-25 DIAGNOSIS — K219 Gastro-esophageal reflux disease without esophagitis: Secondary | ICD-10-CM

## 2011-12-25 DIAGNOSIS — Z23 Encounter for immunization: Secondary | ICD-10-CM

## 2011-12-25 MED ORDER — OMEPRAZOLE 20 MG PO CPDR: 20.0000 mg | DELAYED_RELEASE_CAPSULE | Freq: Every day | ORAL | Status: DC

## 2011-12-25 NOTE — Progress Notes (Signed)
  Subjective:    Patient ID: Makayla Vasquez, female    DOB: [birth year], 31 y.o.   MRN: [id number]  HPI Pt is here for hospital follow for chest pains with a formal diagnosis of heartburn. Pt was at [facility] 3/3. EKG and cardiac enzymes were not concerning for any acute abnormalities though there was some concern for pt's pacemaker needing to be changed. Pt was placed on zantac for heartburn. Pt states that she has been using medication as prescribed and it has been minimally effective to treat sxs. Sxs include burping, gas, chest pain after eating. Pt also reports palpitations. Pt reports heartburn in the past.  Pt has an appointment with Dr. Ladona Ridgel [facility]) to discuss pacemaker.    Review of Systems See HPI, otherwise ROS negative     Objective:   Physical Exam Gen: up in chair, NAD CV: RRR, no murmurs auscultated PULM: CTAB, no wheezes, rales, rhoncii ABD: obese, + bowel sounds, + mild epigastric tenderness.  EXT: 2+ peripheral pulses         Assessment & Plan:

## 2011-12-25 NOTE — Patient Instructions (Signed)
It was good to see you  I have started you on prilosec for reflux  If you develop any chest pain, shortness of breath or dizziness, go to the hospital  Be sure to follow up with the Cardiologist.  Call with any questions,  God Bless Doree Albee MD  Gastroesophageal Reflux Disease, Adult Gastroesophageal reflux disease (GERD) happens when acid from your stomach flows up into the esophagus. When acid comes in contact with the esophagus, the acid causes soreness (inflammation) in the esophagus. Over time, GERD may create small holes (ulcers) in the lining of the esophagus. CAUSES   Increased body weight. This puts pressure on the stomach, making acid rise from the stomach into the esophagus.   Smoking. This increases acid production in the stomach.   Drinking alcohol. This causes decreased pressure in the lower esophageal sphincter (valve or ring of muscle between the esophagus and stomach), allowing acid from the stomach into the esophagus.   Late evening meals and a full stomach. This increases pressure and acid production in the stomach.   A malformed lower esophageal sphincter.  Sometimes, no cause is found. SYMPTOMS   Burning pain in the lower part of the mid-chest behind the breastbone and in the mid-stomach area. This may occur twice a week or more often.   Trouble swallowing.   Sore throat.   Dry cough.   Asthma-like symptoms including chest tightness, shortness of breath, or wheezing.  DIAGNOSIS  Your caregiver may be able to diagnose GERD based on your symptoms. In some cases, X-rays and other tests may be done to check for complications or to check the condition of your stomach and esophagus. TREATMENT  Your caregiver may recommend over-the-counter or prescription medicines to help decrease acid production. Ask your caregiver before starting or adding any new medicines.  HOME CARE INSTRUCTIONS   Change the factors that you can control. Ask your caregiver for guidance  concerning weight loss, quitting smoking, and alcohol consumption.   Avoid foods and drinks that make your symptoms worse, such as:   Caffeine or alcoholic drinks.   Chocolate.   Peppermint or mint flavorings.   Garlic and onions.   Spicy foods.   Citrus fruits, such as oranges, lemons, or limes.   Tomato-based foods such as sauce, chili, salsa, and pizza.   Fried and fatty foods.   Avoid lying down for the 3 hours prior to your bedtime or prior to taking a nap.   Eat small, frequent meals instead of large meals.   Wear loose-fitting clothing. Do not wear anything tight around your waist that causes pressure on your stomach.   Raise the head of your bed 6 to 8 inches with wood blocks to help you sleep. Extra pillows will not help.   Only take over-the-counter or prescription medicines for pain, discomfort, or fever as directed by your caregiver.   Do not take aspirin, ibuprofen, or other nonsteroidal anti-inflammatory drugs (NSAIDs).  SEEK IMMEDIATE MEDICAL CARE IF:   You have pain in your arms, neck, jaw, teeth, or back.   Your pain increases or changes in intensity or duration.   You develop nausea, vomiting, or sweating (diaphoresis).   You develop shortness of breath, or you faint.   Your vomit is green, yellow, black, or looks like coffee grounds or blood.   Your stool is red, bloody, or black.  These symptoms could be signs of other problems, such as heart disease, gastric bleeding, or esophageal bleeding. MAKE SURE YOU:  Understand these instructions.   Will watch your condition.   Will get help right away if you are not doing well or get worse.  Document Released: 07/16/2005 Document Revised: 09/25/2011 Document Reviewed: 04/25/2011 [facility] Patient Information 2012 [place], [place].

## 2011-12-26 NOTE — Assessment & Plan Note (Signed)
Transitioned to prilosec from H2 blocker. Discussed red flags for return. Will continue to follow.

## 2011-12-29 ENCOUNTER — Ambulatory Visit (INDEPENDENT_AMBULATORY_CARE_PROVIDER_SITE_OTHER): Payer: Medicare Other | Admitting: *Deleted

## 2011-12-29 ENCOUNTER — Encounter: Payer: Self-pay | Admitting: Internal Medicine

## 2011-12-29 ENCOUNTER — Encounter: Payer: Self-pay | Admitting: Physician Assistant

## 2011-12-29 ENCOUNTER — Ambulatory Visit (INDEPENDENT_AMBULATORY_CARE_PROVIDER_SITE_OTHER): Payer: Medicare Other | Admitting: Physician Assistant

## 2011-12-29 VITALS — BP 140/86 | HR 70 | Ht 66.0 in | Wt 225.4 lb

## 2011-12-29 DIAGNOSIS — K219 Gastro-esophageal reflux disease without esophagitis: Secondary | ICD-10-CM

## 2011-12-29 DIAGNOSIS — R079 Chest pain, unspecified: Secondary | ICD-10-CM

## 2011-12-29 DIAGNOSIS — I442 Atrioventricular block, complete: Secondary | ICD-10-CM

## 2011-12-29 DIAGNOSIS — Z950 Presence of cardiac pacemaker: Secondary | ICD-10-CM

## 2011-12-29 LAB — PACEMAKER DEVICE OBSERVATION
AL IMPEDENCE PM: 407 Ohm
AL THRESHOLD: 0.5 V
ATRIAL PACING PM: 42.3
BATTERY VOLTAGE: 2.63 V

## 2011-12-29 NOTE — Progress Notes (Signed)
[address] [place], [place]  [address] Phone: [phone] Fax:  [phone]  Date:  12/29/2011   Name:  Makayla Vasquez       DOB:  [birth year] MRN:  [id number]  PCP:  Dr. Alvester Morin Primary Electrophysiologist:  Dr. Lewayne Bunting    History of Present Illness: Makayla Vasquez is a 31 y.o. female who presents for follow up.  She has a history of congenital complete heart block.  She is status post permanent pacemaker since the age of 78.  Last generator change 2004.  Other history includes Asthma, GERD, bipolar affective disorder.  Echocardiogram 12/11: EF 35-40%, moderate MR.  Myoview 2/11:  Apical scar (? Artifact), no ischemia, EF 42%.  Reviewed chart from Dr. Deborah Chalk and low EF felt to be related to paced rhythm.  She was previously followed by Dr. Deborah Chalk.    According to the records, she was seen in the ED at [facility] 3/3 with chest pain and dx with heartburn.  Apparently EKG and cardiac markers were unremarkable.  She saw her PCP last week and her H2RA was changed to a PPI.  I do not have any records from [facility].  Notes from her PCP indicate she may have an issue with her pacer.  She has fairly atypical chest pain.  She states it comes on at any time.  It is sharp and throbbing.  She notes it with exertion sometimes.  She does not really associate it with meals.  She belches.  She denies odynophagia, dysphagia, melena, weight loss.  She denies relief with TUMS, zantac.  She has not started Prilosec yet.  She may note chest pain with exertion.  Denies assoc dyspnea, nausea, diaphoresis, syncope.  No orthopnea or PND or edema.  She does prop up at night due to a choking sensation (? Reflux).    Past Medical History  Diagnosis Date  . Heart palpitations   . Cardiac pacemaker     Since age 20. Last generator change in 2004  . Congenital complete AV block   . Obesity   . GERD (gastroesophageal reflux disease)   . Asymptomatic LV dysfunction     Echo in Dec 2011 with EF  35 to 40%. Felt to be due to paced rhythm  . Seizures     as a child- from high fever  . Anxiety   . Hypertension   . Bipolar affective disorder   . Depression     bipolar    Current Outpatient Prescriptions  Medication Sig Dispense Refill  . cetirizine (ZYRTEC) 10 MG tablet Take 1 tablet (10 mg total) by mouth daily.  30 tablet  11  . fluticasone (FLONASE) 50 MCG/ACT nasal spray Place 2 sprays into the nose daily.  16 g  6  . omeprazole (PRILOSEC) 20 MG capsule Take 1 capsule (20 mg total) by mouth daily.  30 capsule  1    Allergies: Allergies  Allergen Reactions  . Sertraline Hcl     REACTION: hives    History  Substance Use Topics  . Smoking status: Former Smoker    Types: Cigarettes  . Smokeless tobacco: Not on file  . Alcohol Use: No     Family History  Problem Relation Age of Onset  . Heart disease Mother   . Hypertension Mother   . Heart disease Father   . Heart disease Sister     cardiomegaly and atherosclerosis     ROS:  Please see the history  of present illness.   She has occ BRBPR due to hard BMs.  She has diarrhea and constipation from IBS.  No pleuritic chest pain.  No pain with lying flat.  All other systems reviewed and negative.   PHYSICAL EXAM: VS:  BP 140/86  Pulse 70  Ht 5\' 6"  (1.676 m)  Wt 225 lb 6.4 oz (102.241 kg)  BMI 36.38 kg/m2 Well nourished, well developed, in no acute distress HEENT: normal Neck: no JVD Vascular: no carotid bruits Cardiac:  normal S1, S2; RRR; no murmur Lungs:  clear to auscultation bilaterally, no wheezing, rhonchi or rales Abd: soft, no hepatomegaly, + epigastric tenderness to palpation Ext: no edema Skin: warm and dry Neuro:  CNs 2-12 intact, no focal abnormalities noted Psych: normal affect  EKG:  AV paced, heart rate 70  ASSESSMENT AND PLAN:  1. Chest pain, unspecified  Atypical symptoms.  She is somewhat of a poor historian.  It does not sound like all of her symptoms are related to meals or relieved  by acid reduction.  She has a FHx of CAD.  It has been 2 years since she had a stress test.  I will arrange a Lexiscan Myoview.     2. GERD (gastroesophageal reflux disease)  I have asked her to start the Prilosec and to follow up with her PCP.  If her stress test is low risk and her symptoms are not improving, she would likely benefit from seeing GI.     3. Cardiac pacemaker  Pacer interrogated today.  She has 1-5 mos of battery life left.  Otherwise, it is functioning normally.  Follow up with device clinic in 1 month.      Luna Glasgow, PA-C  3:25 PM 12/29/2011

## 2011-12-29 NOTE — Progress Notes (Signed)
Pacer check in clinic  

## 2011-12-29 NOTE — Patient Instructions (Addendum)
Your physician recommends that you schedule a follow-up appointment as scheduled with Dr Ladona Ridgel Your physician recommends that you schedule a follow-up appointment in: 1 mth with Device clinic Your physician recommends that you keep your follow-up appointment with your primary  Your physician has recommended you make the following change in your medication: TAKE your Prilosec

## 2012-01-01 ENCOUNTER — Encounter: Payer: Self-pay | Admitting: Family

## 2012-01-01 ENCOUNTER — Ambulatory Visit (INDEPENDENT_AMBULATORY_CARE_PROVIDER_SITE_OTHER): Payer: Medicare Other | Admitting: Family

## 2012-01-01 ENCOUNTER — Other Ambulatory Visit (HOSPITAL_COMMUNITY)
Admission: RE | Admit: 2012-01-01 | Discharge: 2012-01-01 | Disposition: A | Discharge status: Home / Self Care | Payer: Medicare Other | Source: Ambulatory Visit | Attending: Family | Admitting: Family

## 2012-01-01 VITALS — BP 119/73 | HR 75 | Temp 97.6°F | Ht 66.0 in | Wt 223.3 lb

## 2012-01-01 DIAGNOSIS — R102 Pelvic and perineal pain: Secondary | ICD-10-CM

## 2012-01-01 DIAGNOSIS — Z01419 Encounter for gynecological examination (general) (routine) without abnormal findings: Secondary | ICD-10-CM

## 2012-01-01 DIAGNOSIS — Z124 Encounter for screening for malignant neoplasm of cervix: Secondary | ICD-10-CM | POA: Insufficient documentation

## 2012-01-01 DIAGNOSIS — N949 Unspecified condition associated with female genital organs and menstrual cycle: Secondary | ICD-10-CM

## 2012-01-01 LAB — POCT URINALYSIS DIP (DEVICE)
Bilirubin Urine: NEGATIVE
Ketones, ur: NEGATIVE mg/dL
Leukocytes, UA: NEGATIVE
pH: 6 (ref 5.0–8.0)

## 2012-01-01 NOTE — Patient Instructions (Signed)

## 2012-01-01 NOTE — Progress Notes (Signed)
  Subjective:     Makayla Vasquez is a 31 y.o. female and is here for a comprehensive physical exam. The patient reports problems - pelvic cramping for 2-3 months and increased urine frequency for 1 month.  She denies vaginal bleeding, vaginal discharge, dysuria, or burning.  She also denies new sexual partners.  She had her IUD removed after experiencing SE, then had an Nexplanon implant placed about 6 months ago.  History   Social History  . Marital Status: Single    Spouse Name: N/A    Number of Children: 1  . Years of Education: N/A   Occupational History  . CNA    Social History Main Topics  . Smoking status: Former Smoker    Types: Cigarettes  . Smokeless tobacco: Not on file  . Alcohol Use: No  . Drug Use: No  . Sexually Active: Yes    Birth Control/ Protection: IUD, Implant   Other Topics Concern  . Not on file   Social History Narrative  . No narrative on file   Health Maintenance  Topic Date Due  . Influenza Vaccine  07/26/2012  . Pap Smear  12/24/2013  . Tetanus/tdap  03/20/2016    The following portions of the patient's history were reviewed and updated as appropriate: allergies, current medications, past family history, past medical history, past social history, past surgical history and problem list.  Review of Systems Genitourinary:positive for frequency Integument/breast: negative   Objective:    General appearance: alert, cooperative, appears stated age and no distress Head: Normocephalic, without obvious abnormality, atraumatic Breasts: normal appearance, no masses or tenderness Abdomen: normal findings: no masses palpable and soft, and non distended and abnormal findings:  TTP Bliateral lower quadrants Pelvic: cervix normal in appearance, external genitalia normal, no adnexal masses or tenderness, no cervical motion tenderness, rectovaginal septum normal, uterus normal size, shape, and consistency and vagina normal without discharge    Assessment:    Healthy female exam  Pelvic Pain     Plan:  Will order UA to assess for possible UTI and Pelvic - normal  Ultrasound to assess pelvic pain.    Pap smear to lab GC/CT   Pt seen and examined with PA student; agree with note above. [facility]

## 2012-01-01 NOTE — Progress Notes (Signed)
Addended by: Lynnell Dike on: 01/01/2012 02:59 PM   Modules accepted: Orders

## 2012-01-07 ENCOUNTER — Ambulatory Visit (HOSPITAL_COMMUNITY): Payer: Medicare Other | Attending: Cardiovascular Disease | Admitting: Radiology

## 2012-01-07 VITALS — BP 110/70 | Ht 65.5 in | Wt 222.0 lb

## 2012-01-07 DIAGNOSIS — R0609 Other forms of dyspnea: Secondary | ICD-10-CM | POA: Insufficient documentation

## 2012-01-07 DIAGNOSIS — Z87891 Personal history of nicotine dependence: Secondary | ICD-10-CM | POA: Insufficient documentation

## 2012-01-07 DIAGNOSIS — R42 Dizziness and giddiness: Secondary | ICD-10-CM | POA: Insufficient documentation

## 2012-01-07 DIAGNOSIS — Z9861 Coronary angioplasty status: Secondary | ICD-10-CM | POA: Insufficient documentation

## 2012-01-07 DIAGNOSIS — R002 Palpitations: Secondary | ICD-10-CM

## 2012-01-07 DIAGNOSIS — R079 Chest pain, unspecified: Secondary | ICD-10-CM

## 2012-01-07 DIAGNOSIS — E669 Obesity, unspecified: Secondary | ICD-10-CM | POA: Insufficient documentation

## 2012-01-07 DIAGNOSIS — I10 Essential (primary) hypertension: Secondary | ICD-10-CM | POA: Insufficient documentation

## 2012-01-07 DIAGNOSIS — R0989 Other specified symptoms and signs involving the circulatory and respiratory systems: Secondary | ICD-10-CM | POA: Insufficient documentation

## 2012-01-07 DIAGNOSIS — I4949 Other premature depolarization: Secondary | ICD-10-CM

## 2012-01-07 MED ORDER — TECHNETIUM TC 99M TETROFOSMIN IV KIT
30.0000 | PACK | Freq: Once | INTRAVENOUS | Status: AC | PRN
  Administered 2012-01-07: 30 via INTRAVENOUS

## 2012-01-07 MED ORDER — TECHNETIUM TC 99M TETROFOSMIN IV KIT
10.0000 | PACK | Freq: Once | INTRAVENOUS | Status: AC | PRN
  Administered 2012-01-07: 10 via INTRAVENOUS

## 2012-01-07 MED ORDER — REGADENOSON 0.4 MG/5ML IV SOLN
0.4000 mg | Freq: Once | INTRAVENOUS | Status: AC
  Administered 2012-01-07: 0.4 mg via INTRAVENOUS

## 2012-01-07 NOTE — Progress Notes (Signed)
[facility] SITE 3 NUCLEAR MED [address] [place] [address] [phone]  Cardiology Nuclear Med Study  Makayla Vasquez is a 31 y.o. female     MRN : [id number]     DOB: 17-Aug-1981  Procedure Date: 01/07/2012  Nuclear Med Background Indication for Stress Test:  Evaluation for Ischemia and 12/21/11 Post Hospital:Chest pain, (-) enzymes History: '97 Pacemaker: Generator change in '04 (Dual Chamber), 02/11 MPS: EF: 42% Apical scar (-) ischemia, Asthma Cardiac Risk Factors: History of Smoking, Hypertension and Obesity  Symptoms:  Chest Pain, Dizziness and DOE   Nuclear Pre-Procedure Caffeine/Decaff Intake:  None NPO After: 9:00pm   Lungs:  clear O2 Sat: 98% on room air. IV 0.9% NS with Angio Cath:  22g  IV Site: R Wrist  IV Started by:  Stanton Kidney, EMT-P  Chest Size (in):  44 Cup Size: D  Height: 5' 5.5" (1.664 m)  Weight:  222 lb (100.699 kg)  BMI:  Body mass index is 36.38 kg/(m^2). Tech Comments:  NA    Nuclear Med Study 1 or 2 day study: 1 day  Stress Test Type:  Eugenie Birks  Reading MD: Charlton Haws, MD  Order Authorizing Provider:  G.Ladona Ridgel MD/ S.Weaver PA  Resting Radionuclide: Technetium 60m Tetrofosmin  Resting Radionuclide Dose: 10.9 mCi   Stress Radionuclide:  Technetium 27m Tetrofosmin  Stress Radionuclide Dose: 33.0 mCi           Stress Protocol Rest HR: 68 Stress HR: 117  Rest BP: 110/70 Stress BP: 114/66  Exercise Time (min): n/a METS: n/a   Predicted Max HR: 189 bpm % Max HR: 61.9 bpm Rate Pressure Product: 29528   Dose of Adenosine (mg):  n/a Dose of Lexiscan: 0.4 mg  Dose of Atropine (mg): n/a Dose of Dobutamine: n/a mcg/kg/min (at max HR)  Stress Test Technologist: Milana Na, EMT-P  Nuclear Technologist:  Domenic Polite, CNMT     Rest Procedure:  Myocardial perfusion imaging was performed at rest 45 minutes following the intravenous administration of Technetium 28m Tetrofosmin. Rest ECG: A/V Pacing  Stress Procedure:   The patient received IV Lexiscan 0.4 mg over 15-seconds.  Technetium 89m Tetrofosmin injected at 30-seconds.  There were non Dx changes and rare pvcs with Lexiscan.  Quantitative spect images were obtained after a 45 minute delay. Stress ECG: A/V Pacemaker  QPS Raw Data Images:  Motion artifact and bowel artifact Stress Images:  Decreased apical uptake Rest Images:  Decreased apicl uptake Subtraction (SDS):  SDS 9 apnormal at apex Transient Ischemic Dilatation (Normal <1.22):  1.02 Lung/Heart Ratio (Normal <0.45):  0.32  Quantitative Gated Spect Images QGS EDV:  226 ml QGS ESV:  160 ml  Impression Exercise Capacity:  Lexiscan with low level exercise. BP Response:  Normal blood pressure response. Clinical Symptoms:  Nausea ECG Impression:  V paced Comparison with Prior Nuclear Study: No images to compare  Overall Impression:  LVE.  Small apical infarct.  Lots of artifact.  No ischemia.  Diffuse hypokinesis with serverely reduced EF  LV Ejection Fraction: 29%.  LV Wall Motion:  Diffuse hypokinesis with servely reduced EF   Charlton Haws

## 2012-01-08 ENCOUNTER — Ambulatory Visit (HOSPITAL_COMMUNITY)
Admission: RE | Admit: 2012-01-08 | Discharge: 2012-01-08 | Disposition: A | Discharge status: Home / Self Care | Payer: Medicare Other | Source: Ambulatory Visit | Attending: Family | Admitting: Family

## 2012-01-08 ENCOUNTER — Telehealth: Payer: Self-pay | Admitting: Internal Medicine

## 2012-01-08 DIAGNOSIS — R102 Pelvic and perineal pain: Secondary | ICD-10-CM

## 2012-01-08 DIAGNOSIS — N949 Unspecified condition associated with female genital organs and menstrual cycle: Secondary | ICD-10-CM | POA: Insufficient documentation

## 2012-01-08 NOTE — Telephone Encounter (Signed)
FU Call: Pt returning call from our office to find out results of pt stress test. Please return pt call to discuss further.

## 2012-01-08 NOTE — Progress Notes (Signed)
Addended by: Lisabeth Devoid F on: 01/08/2012 03:28 PM   Modules accepted: Orders

## 2012-01-08 NOTE — Telephone Encounter (Signed)
Fu call °Patient returning your call °

## 2012-01-08 NOTE — Telephone Encounter (Signed)
I spoke with pt about stress test results and need for ECHO to evaluate EF. ECHO ordered for 3./22/13 at 0830 Mylo Red RN

## 2012-01-09 ENCOUNTER — Telehealth: Payer: Self-pay | Admitting: *Deleted

## 2012-01-09 ENCOUNTER — Other Ambulatory Visit: Payer: Self-pay

## 2012-01-09 ENCOUNTER — Ambulatory Visit (HOSPITAL_COMMUNITY): Payer: Medicare Other | Attending: Internal Medicine

## 2012-01-09 DIAGNOSIS — R002 Palpitations: Secondary | ICD-10-CM | POA: Insufficient documentation

## 2012-01-09 DIAGNOSIS — R079 Chest pain, unspecified: Secondary | ICD-10-CM

## 2012-01-09 DIAGNOSIS — R072 Precordial pain: Secondary | ICD-10-CM | POA: Insufficient documentation

## 2012-01-09 NOTE — Telephone Encounter (Signed)
Message copied by Tarri Fuller on Fri Jan 09, 2012  5:02 PM ------      Message from: [place], [place] T      Created: Fri Jan 09, 2012  2:37 PM       EF is getting worse      She needs an appointment with Dr. Lewayne Bunting soon to discuss      Likely is nonischemic cardiomyopathy but he may want her to have a cath      Will also need to discuss initiating medications for cardiomyopathy and to look at her pacer to see if setting need to be changed      Tereso Newcomer, PA-C  2:35 PM 01/09/2012

## 2012-01-09 NOTE — Telephone Encounter (Signed)
pt notified of echo results and worsening EF 20-25 %, needs appt to see GT soon per Wende Mott, pa-c. pt gave verbal understanding today. Danielle Rankin

## 2012-01-14 ENCOUNTER — Encounter: Payer: Self-pay | Admitting: Internal Medicine

## 2012-01-14 ENCOUNTER — Ambulatory Visit (INDEPENDENT_AMBULATORY_CARE_PROVIDER_SITE_OTHER): Payer: Medicare Other | Admitting: Internal Medicine

## 2012-01-14 VITALS — BP 110/76 | HR 74 | Wt 224.1 lb

## 2012-01-14 DIAGNOSIS — Z950 Presence of cardiac pacemaker: Secondary | ICD-10-CM

## 2012-01-14 DIAGNOSIS — I499 Cardiac arrhythmia, unspecified: Secondary | ICD-10-CM

## 2012-01-14 MED ORDER — CARVEDILOL 3.125 MG PO TABS: 3.1250 mg | ORAL_TABLET | Freq: Two times a day (BID) | ORAL | Status: DC

## 2012-01-14 NOTE — Assessment & Plan Note (Signed)
Her pacemaker is approaching elective replacement. Her pacing leads are working satisfactorily though her impedance in the ventricular lead is a bit on the low side. I discussed treatment options with the patient. One option would be to change at her generator. Second option would be to add in the left ventricular lead. Third option would be to add a left ventricular lead and a new right ventricular lead. Finally her severe left ventricular dysfunction with mild shortness of breath might suggest the need for an ICD. Currently she has been on no medical therapy for her left ventricular dysfunction. My plan would be that we start her on low-dose beta blocker today with plans for up titration as well as insertion of an ACE inhibitor. We would consider repeating her echo prior to pacemaker surgery. I've encouraged the patient to maintain a low-salt diet.

## 2012-01-14 NOTE — Patient Instructions (Signed)
Your physician recommends that you schedule a follow-up appointment in: 1 month with device clinic  Your physician has recommended you make the following change in your medication:  1) Start Carvedilol 3.125mg  twice daily

## 2012-01-14 NOTE — Progress Notes (Signed)
HPI Makayla Vasquez returns today for followup. She is a very pleasant 31 year old woman with congenital complete heart block status post pacemaker insertion. She is approaching elective replacement on her device. The patient denies chest pain. If she gets in a hurry, while carrying her 69-year-old child, she notes mild dyspnea. No chest pain. She has left ventricular dysfunction by stress testing and by echocardiography. She denies syncope. She has been on no medical therapy. Allergies  Allergen Reactions  . Sertraline Hcl     REACTION: hives     Current Outpatient Prescriptions  Medication Sig Dispense Refill  . DISCONTD: cetirizine (ZYRTEC) 10 MG tablet Take 1 tablet (10 mg total) by mouth daily.  30 tablet  11  . carvedilol (COREG) 3.125 MG tablet Take 1 tablet (3.125 mg total) by mouth 2 (two) times daily.  60 tablet  11  . DISCONTD: fluticasone (FLONASE) 50 MCG/ACT nasal spray Place 2 sprays into the nose daily.  16 g  6     Past Medical History  Diagnosis Date  . Heart palpitations   . Cardiac pacemaker     Since age 49. Last generator change in 2004  . Congenital complete AV block   . Obesity   . GERD (gastroesophageal reflux disease)   . Asymptomatic LV dysfunction     Echo in Dec 2011 with EF 35 to 40%. Felt to be due to paced rhythm  . Seizures     as a child- from high fever  . Anxiety   . Hypertension   . Bipolar affective disorder   . Depression     bipolar    ROS:   All systems reviewed and negative except as noted in the HPI.   Past Surgical History  Procedure Date  . Throat surgery 1994    s/p laser treatment  . Cesarean section   . Insert / replace / remove pacemaker     2001  . Cholecystectomy   . Iud removal 11/03/2011    Procedure: INTRAUTERINE DEVICE (IUD) REMOVAL;  Surgeon: Makayla C. Marice Potter, MD;  Location: [facility];  Service: Gynecology;  Laterality: N/A;     Family History  Problem Relation Age of Onset  . Heart disease Mother   . Hypertension Mother    . Heart disease Father   . Heart disease Sister     cardiomegaly and atherosclerosis     History   Social History  . Marital Status: Single    Spouse Name: N/A    Number of Children: 1  . Years of Education: N/A   Occupational History  . CNA    Social History Main Topics  . Smoking status: Former Smoker    Types: Cigarettes  . Smokeless tobacco: Not on file  . Alcohol Use: No  . Drug Use: No  . Sexually Active: Yes    Birth Control/ Protection: IUD, Implant   Other Topics Concern  . Not on file   Social History Narrative  . No narrative on file     BP 110/76  Pulse 74  Wt 101.66 kg (224 lb 1.9 oz)  Physical Exam:  Well appearing obese, young woman, NAD HEENT: Unremarkable Neck:  No JVD, no thyromegally Lymphatics:  No adenopathy Lungs:  Clear with no wheezes, rales, or rhonchi. HEART:  Regular rate rhythm, no murmurs, no rubs, no clicks Abd:  soft, positive bowel sounds, no organomegally, no rebound, no guarding Ext:  2 plus pulses, no edema, no cyanosis, no clubbing Skin:  No  rashes no nodules Neuro:  CN II through XII intact, motor grossly intact   DEVICE  Normal device function.  See PaceArt for details. She is approaching but not yet at elective replacement.  Assess/Plan:

## 2012-01-22 ENCOUNTER — Ambulatory Visit: Payer: Medicare Other | Admitting: Family Medicine

## 2012-02-09 ENCOUNTER — Ambulatory Visit: Payer: Medicare Other | Admitting: Family Medicine

## 2012-02-16 ENCOUNTER — Other Ambulatory Visit: Payer: Self-pay | Admitting: *Deleted

## 2012-02-16 ENCOUNTER — Ambulatory Visit (INDEPENDENT_AMBULATORY_CARE_PROVIDER_SITE_OTHER): Payer: Medicare Other | Admitting: *Deleted

## 2012-02-16 DIAGNOSIS — I499 Cardiac arrhythmia, unspecified: Secondary | ICD-10-CM

## 2012-02-16 DIAGNOSIS — I459 Conduction disorder, unspecified: Secondary | ICD-10-CM

## 2012-02-16 LAB — PACEMAKER DEVICE OBSERVATION: RV LEAD IMPEDENCE PM: 303 Ohm

## 2012-02-16 NOTE — Progress Notes (Signed)
PPM battery check 

## 2012-02-17 ENCOUNTER — Encounter: Payer: Self-pay | Admitting: Family Medicine

## 2012-02-17 ENCOUNTER — Ambulatory Visit (INDEPENDENT_AMBULATORY_CARE_PROVIDER_SITE_OTHER): Payer: Medicare Other | Admitting: Family Medicine

## 2012-02-17 DIAGNOSIS — K5900 Constipation, unspecified: Secondary | ICD-10-CM

## 2012-02-17 DIAGNOSIS — R1030 Lower abdominal pain, unspecified: Secondary | ICD-10-CM

## 2012-02-17 DIAGNOSIS — R109 Unspecified abdominal pain: Secondary | ICD-10-CM

## 2012-02-17 MED ORDER — POLYETHYLENE GLYCOL 3350 17 GM/SCOOP PO POWD: 17.0000 g | Freq: Three times a day (TID) | ORAL | Status: AC

## 2012-02-17 NOTE — Patient Instructions (Signed)

## 2012-02-17 NOTE — Progress Notes (Signed)
Subjective:     Patient ID: Makayla Vasquez, female   DOB: 24-Sep-1981, 31 y.o.   MRN: [id number]  HPI Makayla Vasquez presents today for follow-up of lower abdominal pain for the past year that has increased in frequency to every other day over the past couple months. She describes alternating dull and sharp pain that can come as individual or multiple episodes lasting approximately 30 minutes.  She has found no means to relieve this and is unable to associate any aggravating factors. She does acknowledge a long-standing history of constipation, with bowel movements typically occurring only once per week. She continues to be able to pass flatus. She also related having used Mirena until it required removal due to lower abdominal pain. She denies nausea, vomiting, fever, dysuria, dyspareunia, and vaginal discharge.     Review of Systems Patient denies symptoms except as outlined in the HPI.    Objective:   Physical Exam  Constitutional: She is oriented to person, place, and time. She appears well-developed and well-nourished. No distress.  HENT:  Head: Normocephalic.  Cardiovascular: Normal rate, regular rhythm and intact distal pulses.   Pulmonary/Chest: Effort normal and breath sounds normal. No respiratory distress. She has no wheezes. She has no rales.  Abdominal: Soft. Bowel sounds are normal.       Obese abdomen with some tenderness noted over the left abdomen. On deep palpation firm stool could be felt within this region.  Neurological: She is alert and oriented to person, place, and time.       Assessment:     Makayla Vasquez is a 31 y.o. Woman presenting with worsening lower abdominal pain. Lower abdomen pain  Pelvic ultrasound displayed normal anatomy  History of constipation combined with physical exam make constipation the likely source of this  pain  Miralax TID with an increased fiber diet to improve quantity and quality of bowel movements  Follow-up for results of this regimen and consult  patient on warning signs and seriousness of  obstruction (nausea, vomiting, absence of flatus)     Plan:     Pt seen and examined with MS4 and agree with above assessment and plan.

## 2012-02-18 DIAGNOSIS — R109 Unspecified abdominal pain: Secondary | ICD-10-CM | POA: Insufficient documentation

## 2012-02-18 NOTE — Assessment & Plan Note (Signed)
Pelvic ultrasound displayed normal anatomy  History of constipation combined with physical exam make constipation the likely source of this  pain  Miralax TID with an increased fiber diet to improve quantity and quality of bowel movements.  KUB to assess stool burden  GI/GU red flags discussed. Follow up in 2-4 weeks if sxs not improved.

## 2012-02-23 ENCOUNTER — Ambulatory Visit
Admission: RE | Admit: 2012-02-23 | Discharge: 2012-02-23 | Disposition: A | Discharge status: Home / Self Care | Payer: Medicare Other | Source: Ambulatory Visit | Attending: Family Medicine | Admitting: Family Medicine

## 2012-02-26 ENCOUNTER — Other Ambulatory Visit: Payer: Self-pay

## 2012-02-26 ENCOUNTER — Ambulatory Visit (HOSPITAL_COMMUNITY): Payer: Medicare Other | Attending: Family Medicine

## 2012-02-26 ENCOUNTER — Ambulatory Visit (INDEPENDENT_AMBULATORY_CARE_PROVIDER_SITE_OTHER): Payer: Medicare Other | Admitting: Internal Medicine

## 2012-02-26 ENCOUNTER — Encounter: Payer: Self-pay | Admitting: Internal Medicine

## 2012-02-26 ENCOUNTER — Encounter: Payer: Self-pay | Admitting: *Deleted

## 2012-02-26 VITALS — BP 130/84 | HR 60 | Ht 66.0 in | Wt 224.4 lb

## 2012-02-26 DIAGNOSIS — I10 Essential (primary) hypertension: Secondary | ICD-10-CM | POA: Insufficient documentation

## 2012-02-26 DIAGNOSIS — I5022 Chronic systolic (congestive) heart failure: Secondary | ICD-10-CM

## 2012-02-26 DIAGNOSIS — Z950 Presence of cardiac pacemaker: Secondary | ICD-10-CM

## 2012-02-26 DIAGNOSIS — R002 Palpitations: Secondary | ICD-10-CM | POA: Insufficient documentation

## 2012-02-26 DIAGNOSIS — I459 Conduction disorder, unspecified: Secondary | ICD-10-CM

## 2012-02-26 DIAGNOSIS — I059 Rheumatic mitral valve disease, unspecified: Secondary | ICD-10-CM

## 2012-02-26 NOTE — Patient Instructions (Signed)
Upgrade Pacemaker to a Bi-V Pacemaker  Be at the hospital at 8:30am the morning of your procedure.  Nothing to eat or drink after midnight the night prior.  Check in at Short Stay above the ER

## 2012-02-26 NOTE — Assessment & Plan Note (Signed)
She has reached elective replacement. I anticipate upgrading her to a biventricular pacemaker. The risk, goals, benefits, and expectations of the procedure have been discussed with the patient and she wishes to proceed.

## 2012-02-26 NOTE — Progress Notes (Signed)
HPI Ms. Jernberg returns today for followup. She is a very pleasant 31 year old woman with a history of congenital complete heart block. She status post pacemaker insertion, initially in 1997. She underwent generator change in 2004. She has reached elective replacement indication. In 2011, she was found to have left ventricular dysfunction with an ejection fraction of 35-40% but was asymptomatic. She developed chest pain and shortness of breath approximately 4 months ago and repeat echo demonstrated an ejection fraction of 20-25%. She was placed on carvedilol and a repeat echo demonstrates improvement in her EF 30-35%. Her CHF symptoms are class II. In addition, the patient's ventricular pacing lead has had a reduction in pacing impedance. She denies syncope. Minimal peripheral edema. Allergies  Allergen Reactions  . Sertraline Hcl     REACTION: hives     Current Outpatient Prescriptions  Medication Sig Dispense Refill  . carvedilol (COREG) 3.125 MG tablet Take 1 tablet (3.125 mg total) by mouth 2 (two) times daily.  60 tablet  11  . DISCONTD: cetirizine (ZYRTEC) 10 MG tablet Take 1 tablet (10 mg total) by mouth daily.  30 tablet  11  . DISCONTD: fluticasone (FLONASE) 50 MCG/ACT nasal spray Place 2 sprays into the nose daily.  16 g  6     Past Medical History  Diagnosis Date  . Heart palpitations   . Cardiac pacemaker     Since age 30. Last generator change in 2004  . Congenital complete AV block   . Obesity   . GERD (gastroesophageal reflux disease)   . Asymptomatic LV dysfunction     Echo in Dec 2011 with EF 35 to 40%. Felt to be due to paced rhythm  . Seizures     as a child- from high fever  . Anxiety   . Hypertension   . Bipolar affective disorder   . Depression     bipolar    ROS:   All systems reviewed and negative except as noted in the HPI.   Past Surgical History  Procedure Date  . Throat surgery 1994    s/p laser treatment  . Cesarean section   . Insert / replace  / remove pacemaker     2001  . Cholecystectomy   . Iud removal 11/03/2011    Procedure: INTRAUTERINE DEVICE (IUD) REMOVAL;  Surgeon: Myra C. Marice Potter, MD;  Location: [facility];  Service: Gynecology;  Laterality: N/A;     Family History  Problem Relation Age of Onset  . Heart disease Mother   . Hypertension Mother   . Heart disease Father   . Heart disease Sister     cardiomegaly and atherosclerosis     History   Social History  . Marital Status: Single    Spouse Name: N/A    Number of Children: 1  . Years of Education: N/A   Occupational History  . CNA    Social History Main Topics  . Smoking status: Former Smoker    Types: Cigarettes  . Smokeless tobacco: Not on file  . Alcohol Use: No  . Drug Use: No  . Sexually Active: Yes    Birth Control/ Protection: IUD, Implant   Other Topics Concern  . Not on file   Social History Narrative  . No narrative on file     BP 130/84  Pulse 60  Ht 5\' 6"  (1.676 m)  Wt 224 lb 6.4 oz (101.787 kg)  BMI 36.22 kg/m2  Physical Exam:  Well appearing 31 year old woman, NAD HEENT:  Unremarkable Neck:  No JVD, no thyromegally Lungs:  Clear with no wheezes, rales, or rhonchi. Well-healed pacemaker incision. HEART:  Regular rate rhythm, no murmurs, no rubs, no clicks Abd:  soft, positive bowel sounds, no organomegally, no rebound, no guarding Ext:  2 plus pulses, no edema, no cyanosis, no clubbing Skin:  No rashes no nodules Neuro:  CN II through XII intact, motor grossly intact  DEVICE  Normal device function.  See PaceArt for details. She has reached elective replacement.  Assess/Plan:

## 2012-02-26 NOTE — Assessment & Plan Note (Signed)
Currently, her symptoms are class II with left ventricular dysfunction. She is currently only on carvedilol. She has not been on an ACE inhibitor. Unfortunately, a decision will need to be made about her pacemaker. I am concerned about leaving her with a right ventricular lead that shows evidence of pacing impedance problems. One option would be to add a new left ventricular lead in place a biventricular pacemaker. My expectation is that with the combination of cardiac resynchronization, and up titration of her medical therapy, that her ejection fraction will improve. For this reason, I do not think ICD implantation is warranted. She is instructed to maintain a low-sodium diet.

## 2012-02-26 NOTE — Assessment & Plan Note (Signed)
Her blood pressure is only minimally elevated. We will advance her medications going forward in hopes of improving her blood pressure further. She will maintain a low-sodium diet.

## 2012-02-27 ENCOUNTER — Other Ambulatory Visit: Payer: Self-pay | Admitting: *Deleted

## 2012-02-27 ENCOUNTER — Encounter (HOSPITAL_COMMUNITY): Payer: Self-pay | Admitting: Pharmacy Technician

## 2012-02-27 DIAGNOSIS — I459 Conduction disorder, unspecified: Secondary | ICD-10-CM

## 2012-02-27 DIAGNOSIS — Z01818 Encounter for other preprocedural examination: Secondary | ICD-10-CM

## 2012-03-03 ENCOUNTER — Ambulatory Visit (INDEPENDENT_AMBULATORY_CARE_PROVIDER_SITE_OTHER): Payer: Medicare Other | Admitting: Family Medicine

## 2012-03-03 ENCOUNTER — Encounter: Payer: Self-pay | Admitting: Family Medicine

## 2012-03-03 VITALS — BP 111/80 | HR 125 | Ht 66.0 in | Wt 221.0 lb

## 2012-03-03 DIAGNOSIS — R7309 Other abnormal glucose: Secondary | ICD-10-CM

## 2012-03-03 DIAGNOSIS — R7302 Impaired glucose tolerance (oral): Secondary | ICD-10-CM

## 2012-03-03 DIAGNOSIS — K5900 Constipation, unspecified: Secondary | ICD-10-CM

## 2012-03-03 DIAGNOSIS — I10 Essential (primary) hypertension: Secondary | ICD-10-CM

## 2012-03-03 DIAGNOSIS — E669 Obesity, unspecified: Secondary | ICD-10-CM

## 2012-03-03 DIAGNOSIS — L83 Acanthosis nigricans: Secondary | ICD-10-CM

## 2012-03-03 LAB — COMPREHENSIVE METABOLIC PANEL
Albumin: 4.3 g/dL (ref 3.5–5.2)
Alkaline Phosphatase: 64 U/L (ref 39–117)
BUN: 12 mg/dL (ref 6–23)
Glucose, Bld: 97 mg/dL (ref 70–99)
Potassium: 4 mEq/L (ref 3.5–5.3)

## 2012-03-03 NOTE — Progress Notes (Signed)
Subjective:   Pt here for constipation follow up   Recently seen for constipation.  Working dx was constipation (GYN imaging WNL) KUB confirmed with moderate stool burden.  Pt placed on daily miralax -This has been very effective -Abdominal pain has resolved -Is having regular BMs daily      Review of Systems - Negative except as noted above in HPI   Objective:  Current Outpatient Prescriptions  Medication Sig Dispense Refill  . carvedilol (COREG) 3.125 MG tablet Take 3.125 mg by mouth 2 (two) times daily.      [name] DISCONTD: cetirizine (ZYRTEC) 10 MG tablet Take 1 tablet (10 mg total) by mouth daily.  30 tablet  11  . DISCONTD: fluticasone (FLONASE) 50 MCG/ACT nasal spray Place 2 sprays into the nose daily.  16 g  6    Wt Readings from Last 3 Encounters:  03/03/12 221 lb (100.245 kg)  02/26/12 224 lb 6.4 oz (101.787 kg)  02/17/12 221 lb (100.245 kg)   Temp Readings from Last 3 Encounters:  01/01/12 97.6 F (36.4 C) Oral  12/25/11 98.2 F (36.8 C) Oral  12/11/11 96.9 F (36.1 C)    BP Readings from Last 3 Encounters:  03/03/12 111/80  02/26/12 130/84  02/17/12 114/71   Pulse Readings from Last 3 Encounters:  03/03/12 125  02/26/12 60  02/17/12 66    General: alert, cooperative and moderately obese HEENT: PERRLA and extra ocular movement intact Heart: S1, S2 normal, no murmur, rub or gallop, regular rate and rhythm Lungs: clear to auscultation, no wheezes or rales and unlabored breathing Abdomen: abdomen is soft without significant tenderness, masses, organomegaly or guarding Extremities: extremities normal, atraumatic, no cyanosis or edema Skin:+ acanthosis  Neurology: normal without focal findings   Assessment/Plan:

## 2012-03-03 NOTE — Patient Instructions (Signed)
Cardiac Diet This diet can help prevent heart disease and stroke. Many factors influence your heart health, including eating and exercise habits. Coronary risk rises a lot with abnormal blood fat (lipid) levels. Cardiac meal planning includes limiting unhealthy fats, increasing healthy fats, and making other small dietary changes. General guidelines are as follows:  Adjust calorie intake to reach and maintain desirable body weight.   Limit total fat intake to less than 30% of total calories. Saturated fat should be less than 7% of calories.   Saturated fats are found in animal products and in some vegetable products. Saturated vegetable fats are found in coconut oil, cocoa butter, palm oil, and palm kernel oil. Read labels carefully to avoid these products as much as possible. Use butter in moderation. Choose tub margarines and oils that have 2 grams of fat or less. Good cooking oils are canola and olive oils.   Practice low-fat cooking techniques. Do not fry food. Instead, broil, bake, boil, steam, grill, roast on a rack, stir-fry, or microwave it. Other fat reducing suggestions include:   Remove the skin from poultry.   Remove all visible fat from meats.   Skim the fat off stews, soups, and gravies before serving them.   Steam vegetables in water or broth instead of sauting them in fat.   Avoid foods with trans fat (or hydrogenated oils), such as commercially fried foods and commercially baked goods. Commercial shortening and deep-frying fats will contain trans fat.   Increase intake of fruits, vegetables, whole grains, and legumes to replace foods high in fat.   Increase consumption of nuts, legumes, and seeds to at least 4 servings weekly. One serving of a legume equals  cup, and 1 serving of nuts or seeds equals  cup.   Choose whole grains more often. Have 3 servings per day (a serving is 1 ounce [oz]).   Have at least 4 cups of fruit and vegetable a day.   Increase your intake  of soluble fiber to 10 to 25 grams per day. Soluble fiber binds cholesterol to be removed from the blood. Foods high in soluble fiber are dried beans, citrus fruits, oats, apples, bananas, broccoli, Brussels sprouts, and eggplant.   Try to include foods fortified with plant sterols or stanols, such as yogurt, breads, juices, or margarines. Choose several fortified foods to achieve a daily intake of 2 to 3 grams of plant sterols or stanols.   Foods with omega-3 fats can help reduce your risk of heart disease. Aim to have a 3.5 oz portion of fatty fish twice per week, such as salmon, mackerel, albacore tuna, sardines, lake trout, or herring. If you wish to take a fish oil supplement, choose one that contains 1 gram of both DHA and EPA.   Limit processed meats to 2 servings (3 oz portion) weekly.   Limit the sodium in your diet to 1500 milligrams (mg) per day. If you have high blood pressure, talk to a registered dietitian about a DASH (Dietary Approaches to Stop Hypertension) eating plan.   Limit beverages with added sugar, such as soda, to no more than 36 ounces per week.  CHOOSING FOODS Starches  Allowed: Breads: All kinds (wheat, rye, raisin, white, oatmeal, Italian, French, and English muffin bread). Low-fat rolls: English muffins, frankfurter and hamburger buns, bagels, pita bread, tortillas (not fried). Pancakes, waffles, biscuits, and muffins made with recommended oil.   Avoid: Products made with saturated or trans fats, oils, or whole milk products. Butter rolls, cheese breads,   croissants. Commercial doughnuts, muffins, sweet rolls, biscuits, waffles, pancakes, store-bought mixes.  Crackers  Allowed: Low-fat crackers and snacks: Animal, graham, rye, saltine (with recommended oil, no lard), oyster, and matzo crackers. Bread sticks, melba toast, rusks, flatbread, pretzels, and light popcorn.   Avoid: High-fat crackers: cheese crackers, butter crackers, and those made with coconut, palm oil,  or trans fat (hydrogenated oils). Buttered popcorn.  Cereals  Allowed: Hot or cold whole-grain cereals.   Avoid: Cereals containing coconut, hydrogenated vegetable fat, or animal fat.  Potatoes / Pasta / Rice  Allowed: All kinds of potatoes, rice, and pasta (such as macaroni, spaghetti, and noodles).   Avoid: Pasta or rice prepared with cream sauce or high-fat cheese. Chow mein noodles, French fries.  Vegetables  Allowed: All vegetables and vegetable juices.   Avoid: Fried vegetables. Vegetables in cream, butter, or high-fat cheese sauces. Limit coconut. Fruit in cream or custard.  Meat and Meat Substitutes  Allowed: Limit your intake of meat, seafood, and poultry to no more than 6 oz (cooked weight) per day. All lean, well-trimmed beef, veal, pork, and lamb. All chicken and turkey without skin. All fish and shellfish. Wild game: wild duck, rabbit, pheasant, and venison. Meatless dishes: recipes with dried beans, peas, lentils, and tofu (soybean curd). Seeds and nuts: all seeds and most nuts.   Avoid: Prime grade and other heavily marbled and fatty meats, such as short ribs, spare ribs, rib eye roast or steak, frankfurters, sausage, bacon, and high-fat luncheon meats, mutton. Caviar. Commercially fried fish. Domestic duck, goose, venison sausage. Organ meats: liver, gizzard, heart, chitterlings, brains, kidney, sweetbreads.  Dairy  Allowed: Egg whites or low-cholesterol egg substitutes may be used as desired. Low-fat cheeses: nonfat or low-fat cottage cheese (1% or 2% fat), cheeses made with part skim milk, such as mozzarella, farmers, string, or ricotta. (Cheeses should be labeled no more than 2 to 6 grams fat per oz.)   Avoid: Whole milk cheeses, including colby, cheddar, muenster, Monterey Jack, Havarti, Brie, Camembert, American, Swiss, and blue. Creamed cottage cheese, cream cheese.  Milk  Allowed: Skim (or 1%) milk: liquid, powdered, or evaporated. Buttermilk made with low-fat milk.  Drinks made with skim or low-fat milk or cocoa. Chocolate milk or cocoa made with skim or low-fat (1%) milk. Nonfat or low-fat yogurt.   Avoid: Whole milk and whole milk products, including buttermilk or yogurt made from whole milk, drinks made from whole milk. Condensed milk, evaporated whole milk, and 2% milk.  Soups and Combination Foods  Allowed: Low-fat low-sodium soups: broth, dehydrated soups, homemade broth, soups with the fat removed, homemade cream soups made with skim or low-fat milk. Low-fat spaghetti, lasagna, chili, and Spanish rice if low-fat ingredients and low-fat cooking techniques are used.   Avoid: Cream soups made with whole milk, cream, or high-fat cheese. All other soups.  Desserts and Sweets  Allowed: Sherbet, fruit ices, gelatins, meringues, and angel food cake. Homemade desserts with recommended fats, oils, and milk products. Jam, jelly, honey, marmalade, sugars, and syrups. Pure sugar candy, such as gum drops, hard candy, jelly beans, marshmallows, mints, and small amounts of dark chocolate.   Avoid: Commercially prepared cakes, pies, cookies, frosting, pudding, or mixes for these products. Desserts containing whole milk products, chocolate, coconut, lard, palm oil, or palm kernel oil. Ice cream or ice cream drinks. Candy that contains chocolate, coconut, butter, hydrogenated fat, or unknown ingredients. Buttered syrups.  Fats and Oils  Allowed: Vegetable oils: safflower, sunflower, corn, soybean, cottonseed, sesame, canola, olive, or   peanut. Non-hydrogenated margarines. Salad dressing or mayonnaise: homemade or commercial, made with a recommended oil. Low or nonfat salad dressing or mayonnaise.   Limit added fats and oils to 6 to 8 tsp per day (includes fats used in cooking, baking, salads, and spreads on bread). Remember to count the "hidden fats" in foods.   Avoid: Solid fats and shortenings: butter, lard, salt pork, bacon drippings. Gravy containing meat fat,  shortening, or suet. Cocoa butter, coconut. Coconut oil, palm oil, palm kernel oil, or hydrogenated oils: these ingredients are often used in bakery products, nondairy creamers, whipped toppings, candy, and commercially fried foods. Read labels carefully. Salad dressings made of unknown oils, sour cream, or cheese, such as blue cheese and Roquefort. Cream, all kinds: half-and-half, light, heavy, or whipping. Sour cream or cream cheese (even if "light" or low-fat). Nondairy cream substitutes: coffee creamers and sour cream substitutes made with palm, palm kernel, hydrogenated oils, or coconut oil.  Beverages  Allowed: Coffee (regular or decaffeinated), tea. diet carbonated beverages, mineral water. Alcohol: Check with your caregiver. Moderation is recommended.   Avoid: Whole milk, regular sodas, and juice drinks with added sugar.  Condiments  Allowed: All seasonings and condiments. Cocoa powder. "Cream" sauces made with recommended ingredients.   Avoid: Carob powder made with hydrogenated fats.  SAMPLE MENU Breakfast   cup orange juice    cup oatmeal   1 slice toast   1 tsp margarine   1 cup skim milk  Lunch  Turkey sandwich with 2 oz turkey, 2 slices bread   Lettuce and tomato slices   Fresh fruit   Carrot sticks   Coffee or tea   Snack   Fresh fruit or low-fat crackers  Dinner  3 oz lean ground beef   1 baked potato   1 tsp margarine    cup asparagus   Lettuce salad   1 tbs non-creamy dressing    cup peach slices   1 cup skim milk  Document Released: 07/15/2008 Document Revised: 09/25/2011 Document Reviewed: 10/08/2010 [employer] Patient Information 2012 [employer]. 

## 2012-03-03 NOTE — Assessment & Plan Note (Signed)
+   acanthosis on exam. Will check general risk stratification labs.

## 2012-03-03 NOTE — Assessment & Plan Note (Signed)
Clinically resolved. Will continue miralax.

## 2012-03-05 ENCOUNTER — Other Ambulatory Visit: Payer: Self-pay | Admitting: *Deleted

## 2012-03-07 MED ORDER — SODIUM CHLORIDE 0.9 % IR SOLN
80.0000 mg | Status: AC
  Administered 2012-03-08: 80 mg
  Filled 2012-03-07: qty 2

## 2012-03-07 MED ORDER — CEFAZOLIN SODIUM-DEXTROSE 2-3 GM-% IV SOLR
2.0000 g | INTRAVENOUS | Status: AC
  Administered 2012-03-08: 2 g via INTRAVENOUS
  Filled 2012-03-07: qty 50

## 2012-03-08 ENCOUNTER — Ambulatory Visit (HOSPITAL_COMMUNITY)
Admission: RE | Admit: 2012-03-08 | Discharge: 2012-03-09 | Disposition: A | Discharge status: Home / Self Care | Payer: Medicare Other | Source: Ambulatory Visit | Attending: Internal Medicine | Admitting: Internal Medicine

## 2012-03-08 ENCOUNTER — Encounter (HOSPITAL_COMMUNITY): Payer: Self-pay | Admitting: *Deleted

## 2012-03-08 ENCOUNTER — Encounter (HOSPITAL_COMMUNITY)
Admission: RE | Disposition: A | Discharge status: Home / Self Care | Payer: Self-pay | Source: Ambulatory Visit | Attending: Internal Medicine

## 2012-03-08 DIAGNOSIS — F3130 Bipolar disorder, current episode depressed, mild or moderate severity, unspecified: Secondary | ICD-10-CM | POA: Insufficient documentation

## 2012-03-08 DIAGNOSIS — E669 Obesity, unspecified: Secondary | ICD-10-CM | POA: Insufficient documentation

## 2012-03-08 DIAGNOSIS — K219 Gastro-esophageal reflux disease without esophagitis: Secondary | ICD-10-CM | POA: Insufficient documentation

## 2012-03-08 DIAGNOSIS — F411 Generalized anxiety disorder: Secondary | ICD-10-CM | POA: Insufficient documentation

## 2012-03-08 DIAGNOSIS — Z01818 Encounter for other preprocedural examination: Secondary | ICD-10-CM

## 2012-03-08 DIAGNOSIS — I10 Essential (primary) hypertension: Secondary | ICD-10-CM | POA: Insufficient documentation

## 2012-03-08 DIAGNOSIS — I442 Atrioventricular block, complete: Secondary | ICD-10-CM

## 2012-03-08 DIAGNOSIS — I509 Heart failure, unspecified: Secondary | ICD-10-CM

## 2012-03-08 DIAGNOSIS — I459 Conduction disorder, unspecified: Secondary | ICD-10-CM

## 2012-03-08 DIAGNOSIS — I5022 Chronic systolic (congestive) heart failure: Secondary | ICD-10-CM

## 2012-03-08 DIAGNOSIS — Z45018 Encounter for adjustment and management of other part of cardiac pacemaker: Secondary | ICD-10-CM | POA: Insufficient documentation

## 2012-03-08 HISTORY — PX: BI-VENTRICULAR PACEMAKER UPGRADE: SHX5464

## 2012-03-08 LAB — HCG, SERUM, QUALITATIVE: Preg, Serum: NEGATIVE

## 2012-03-08 LAB — CBC
HCT: 40.9 % (ref 36.0–46.0)
MCHC: 33 g/dL (ref 30.0–36.0)
Platelets: 206 10*3/uL (ref 150–400)
RDW: 13.6 % (ref 11.5–15.5)
WBC: 3.7 10*3/uL — ABNORMAL LOW (ref 4.0–10.5)

## 2012-03-08 LAB — SURGICAL PCR SCREEN: MRSA, PCR: POSITIVE — AB

## 2012-03-08 LAB — PROTIME-INR: INR: 1.07 (ref 0.00–1.49)

## 2012-03-08 SURGERY — BI-VENTRICULAR PACEMAKER UPGRADE
Anesthesia: LOCAL

## 2012-03-08 MED ORDER — MIDAZOLAM HCL 5 MG/5ML IJ SOLN
INTRAMUSCULAR | Status: AC
  Filled 2012-03-08: qty 5

## 2012-03-08 MED ORDER — ONDANSETRON HCL 4 MG/2ML IJ SOLN: 4.0000 mg | Freq: Four times a day (QID) | INTRAMUSCULAR | Status: DC | PRN

## 2012-03-08 MED ORDER — HEPARIN (PORCINE) IN NACL 2-0.9 UNIT/ML-% IJ SOLN
INTRAMUSCULAR | Status: AC
  Filled 2012-03-08: qty 1000

## 2012-03-08 MED ORDER — SODIUM CHLORIDE 0.9 % IJ SOLN: 3.0000 mL | Freq: Two times a day (BID) | INTRAMUSCULAR | Status: DC

## 2012-03-08 MED ORDER — YOU HAVE A PACEMAKER BOOK
Freq: Once | Status: AC
  Administered 2012-03-08: 15:00:00
  Filled 2012-03-08: qty 1

## 2012-03-08 MED ORDER — MUPIROCIN 2 % EX OINT
TOPICAL_OINTMENT | CUTANEOUS | Status: AC
  Administered 2012-03-08: 1
  Filled 2012-03-08: qty 22

## 2012-03-08 MED ORDER — CEFAZOLIN SODIUM 1-5 GM-% IV SOLN
1.0000 g | Freq: Four times a day (QID) | INTRAVENOUS | Status: AC
  Administered 2012-03-08 – 2012-03-09 (×3): 1 g via INTRAVENOUS
  Filled 2012-03-08 (×4): qty 50

## 2012-03-08 MED ORDER — SODIUM CHLORIDE 0.9 % IV SOLN
250.0000 mL | INTRAVENOUS | Status: DC
  Administered 2012-03-08: 250 mL via INTRAVENOUS

## 2012-03-08 MED ORDER — LIDOCAINE HCL (PF) 1 % IJ SOLN
INTRAMUSCULAR | Status: AC
  Filled 2012-03-08: qty 60

## 2012-03-08 MED ORDER — CARVEDILOL 3.125 MG PO TABS
3.1250 mg | ORAL_TABLET | Freq: Two times a day (BID) | ORAL | Status: DC
  Administered 2012-03-08 – 2012-03-09 (×2): 3.125 mg via ORAL
  Filled 2012-03-08 (×3): qty 1

## 2012-03-08 MED ORDER — SODIUM CHLORIDE 0.45 % IV SOLN
INTRAVENOUS | Status: DC
  Administered 2012-03-08: 10:00:00 via INTRAVENOUS

## 2012-03-08 MED ORDER — FENTANYL CITRATE 0.05 MG/ML IJ SOLN
INTRAMUSCULAR | Status: AC
  Filled 2012-03-08: qty 2

## 2012-03-08 MED ORDER — HYDROCODONE-ACETAMINOPHEN 5-325 MG PO TABS
1.0000 | ORAL_TABLET | ORAL | Status: DC | PRN
  Administered 2012-03-08: 1 via ORAL
  Administered 2012-03-09 (×2): 2 via ORAL
  Filled 2012-03-08 (×2): qty 2
  Filled 2012-03-08: qty 1

## 2012-03-08 MED ORDER — CEFAZOLIN SODIUM-DEXTROSE 2-3 GM-% IV SOLR
INTRAVENOUS | Status: AC
  Filled 2012-03-08: qty 50

## 2012-03-08 MED ORDER — MUPIROCIN 2 % EX OINT
1.0000 "application " | TOPICAL_OINTMENT | Freq: Two times a day (BID) | CUTANEOUS | Status: DC
  Administered 2012-03-09 (×2): 1 via NASAL
  Filled 2012-03-08: qty 22

## 2012-03-08 MED ORDER — LOSARTAN POTASSIUM 25 MG PO TABS
25.0000 mg | ORAL_TABLET | Freq: Every day | ORAL | Status: DC
  Administered 2012-03-08 – 2012-03-09 (×2): 25 mg via ORAL
  Filled 2012-03-08 (×2): qty 1

## 2012-03-08 MED ORDER — CHLORHEXIDINE GLUCONATE CLOTH 2 % EX PADS
6.0000 | MEDICATED_PAD | Freq: Every day | CUTANEOUS | Status: DC
  Administered 2012-03-09: 6 via TOPICAL

## 2012-03-08 MED ORDER — SODIUM CHLORIDE 0.9 % IJ SOLN: 3.0000 mL | INTRAMUSCULAR | Status: DC | PRN

## 2012-03-08 MED ORDER — CHLORHEXIDINE GLUCONATE 4 % EX LIQD
60.0000 mL | Freq: Once | CUTANEOUS | Status: DC
  Filled 2012-03-08: qty 60

## 2012-03-08 MED ORDER — ACETAMINOPHEN 325 MG PO TABS: 325.0000 mg | ORAL_TABLET | ORAL | Status: DC | PRN

## 2012-03-08 NOTE — Op Note (Signed)
BiV PPM upgrade and pocket revision without immediate complication. [id number].

## 2012-03-08 NOTE — Op Note (Signed)
NAMEKAYLANIE, CAPILI NO.:  [id number]  MEDICAL RECORD NO.:  [id number]  LOCATION:  2508                         FACILITY:  [facility]  PHYSICIAN:  Doylene Canning. Ladona Ridgel, MD    DATE OF BIRTH:  Feb 12, 1981  DATE OF PROCEDURE:  03/08/2012 DATE OF DISCHARGE:                              OPERATIVE REPORT   PROCEDURE PERFORMED:  Removal of previously implanted dual-chamber pacemaker, which has reached elective replacement; insertion of a new left ventricular pacing lead, utilizing coronary sinus and left upper extremity venography; and insertion of a new Bi-V pacemaker with pacemaker pocket revision.  INTRODUCTION:  The patient is a 31 year old woman with complete heart block (congenital).  She is status post permanent pacemaker insertion and has reached elective replacement indication.  Over the course of the last several months, the patient has developed worsening heart failure symptoms.  She was initially found to have an ejection fraction of 20%, and with medical therapy, it has increased to 30%.  She has class 2 symptoms and paced QRS duration of 175 msec.  She is now referred for insertion of a new left ventricular lead and upgrade from a dual-chamber pacemaker to a biventricular pacemaker.  PROCEDURE:  After informed consent was obtained, the patient was taken to the diagnostic EP lab in the fasting state.  After usual preparation and draping, intravenous fentanyl and midazolam was given for sedation. Lidocaine 30 mL was infiltrated into the left infraclavicular region.  A 5-cm incision was carried out over this region.  Electrocautery was utilized to dissect down to the fascial plane.  Initial attempts to puncture the subclavian vein were unsuccessful.  10 mL of IV contrast was injected into the left upper extremity venous system, demonstrating a patent left subclavian vein, which was displaced superiorly.  The vein was then punctured and the 9.5 French peel-away  sheath was advanced into the vein and a 7.5 [place] Medtronic MB2 guiding catheter along with a 6- [place] Hexapolar EP catheter was advanced into the right atrium.  Right atrium was markedly enlarged.  Cannulation of the coronary sinus was very difficult.  Advancement of the coronary sinus guiding catheter was also difficult and multiple times the catheter dislodged.  A Amplatz catheter was finally utilized to maintain coronary sinus access. Venography of the coronary sinus was then carried out with a balloon- tipped catheter.  This demonstrated a high lateral vein, a lateral vein, and a posterior vein.  Initial attempts to cannulate the lateral vein were unsuccessful.  The high lateral vein was then utilized after attempts to cannulate the posterior vein were also unsuccessful.  The high lateral vein was in a location approximately 2 o'clock in the LAO projection.  The Medtronic Attain Ability, model 4196, 88 cm bipolar pacing lead was advanced over the 014 angioplasty guidewire into a position approximately halfway from base to apex.  In this location, the pacing threshold was less than 1 V at 0.5 msec and 10 V pacing did not stimulate the diaphragm.  The pacing impedance was 900 ohms.  With these satisfactory parameters, the lead was liberated from the guiding catheter in the usual manner.  The lead was secured to the  subpectoral fascia with a figure-of-eight silk suture and the sewing sleeve was secured with silk suture.  Electrocautery was then utilized to dissect down to the old dual-chamber pacemaker, which had been displaced quite inferiorly and caudally.  The old pacing leads were pulled up as well and the new Medtronic Bi-V pacemaker, serial number [id number] was connected to the old right ventricular and right atrial leads as well as the new left ventricular lead, and re-suspended at a more superior location on the chest wall.  The pocket was irrigated with  antibiotic irrigation, electrocautery was utilized to assure hemostasis, and the patient's pocket was irrigated and the incision was closed with 2-0 and 3-0 Vicryl.  Benzoin and Steri-Strips were painted on the skin, pressure dressing was applied, and the patient was returned to her room in satisfactory condition.  COMPLICATIONS:  There were no immediate procedure complications.  RESULTS:  This demonstrates successful upgrade of a dual-chamber pacemaker to a Bi-V pacemaker in a patient with a nonischemic cardiomyopathy, pacing-induced left bundle-branch block, QRS duration 175 msec, class 2 heart failure, ejection fraction 30%.     Doylene Canning. Ladona Ridgel, MD     GWT/MEDQ  D:  03/08/2012  T:  03/08/2012  Job:  [id number]

## 2012-03-08 NOTE — Interval H&P Note (Signed)
History and Physical Interval Note:  03/08/2012 10:03 AM  Makayla Vasquez  has presented today for surgery, with the diagnosis of Upgrade  The various methods of treatment have been discussed with the patient and family. After consideration of risks, benefits and other options for treatment, the patient has consented to  Procedure(s) (LRB): BI-VENTRICULAR PACEMAKER UPGRADE (N/A) as a surgical intervention .  The patients' history has been reviewed, patient examined, no change in status, stable for surgery.  I have reviewed the patients' chart and labs.  Questions were answered to the patient's satisfaction.     Lewayne Bunting

## 2012-03-08 NOTE — H&P (View-Only) (Signed)
HPI [name] returns today for followup. She is a very pleasant 30-year-old woman with a history of congenital complete heart block. She status post pacemaker insertion, initially in 1997. She underwent generator change in 2004. She has reached elective replacement indication. In 2011, she was found to have left ventricular dysfunction with an ejection fraction of 35-40% but was asymptomatic. She developed chest pain and shortness of breath approximately 4 months ago and repeat echo demonstrated an ejection fraction of 20-25%. She was placed on carvedilol and a repeat echo demonstrates improvement in her EF 30-35%. Her CHF symptoms are class II. In addition, the patient's ventricular pacing lead has had a reduction in pacing impedance. She denies syncope. Minimal peripheral edema. Allergies  Allergen Reactions  . Sertraline Hcl     REACTION: hives     Current Outpatient Prescriptions  Medication Sig Dispense Refill  . carvedilol (COREG) 3.125 MG tablet Take 1 tablet (3.125 mg total) by mouth 2 (two) times daily.  60 tablet  11  . DISCONTD: cetirizine (ZYRTEC) 10 MG tablet Take 1 tablet (10 mg total) by mouth daily.  30 tablet  11  . DISCONTD: fluticasone (FLONASE) 50 MCG/ACT nasal spray Place 2 sprays into the nose daily.  16 g  6     Past Medical History  Diagnosis Date  . Heart palpitations   . Cardiac pacemaker     Since age 15. Last generator change in 2004  . Congenital complete AV block   . Obesity   . GERD (gastroesophageal reflux disease)   . Asymptomatic LV dysfunction     Echo in Dec 2011 with EF 35 to 40%. Felt to be due to paced rhythm  . Seizures     as a child- from high fever  . Anxiety   . Hypertension   . Bipolar affective disorder   . Depression     bipolar    ROS:   All systems reviewed and negative except as noted in the HPI.   Past Surgical History  Procedure Date  . Throat surgery 1994    s/p laser treatment  . Cesarean section   . Insert / replace  / remove pacemaker     2001  . Cholecystectomy   . Iud removal 11/03/2011    Procedure: INTRAUTERINE DEVICE (IUD) REMOVAL;  Surgeon: Myra C. Dove, MD;  Location: [facility];  Service: Gynecology;  Laterality: N/A;     Family History  Problem Relation Age of Onset  . Heart disease Mother   . Hypertension Mother   . Heart disease Father   . Heart disease Sister     cardiomegaly and atherosclerosis     History   Social History  . Marital Status: Single    Spouse Name: N/A    Number of Children: 1  . Years of Education: N/A   Occupational History  . CNA    Social History Main Topics  . Smoking status: Former Smoker    Types: Cigarettes  . Smokeless tobacco: Not on file  . Alcohol Use: No  . Drug Use: No  . Sexually Active: Yes    Birth Control/ Protection: IUD, Implant   Other Topics Concern  . Not on file   Social History Narrative  . No narrative on file     BP 130/84  Pulse 60  Ht 5' 6" (1.676 m)  Wt 224 lb 6.4 oz (101.787 kg)  BMI 36.22 kg/m2  Physical Exam:  Well appearing 30-year-old woman, NAD HEENT:   Unremarkable Neck:  No JVD, no thyromegally Lungs:  Clear with no wheezes, rales, or rhonchi. Well-healed pacemaker incision. HEART:  Regular rate rhythm, no murmurs, no rubs, no clicks Abd:  soft, positive bowel sounds, no organomegally, no rebound, no guarding Ext:  2 plus pulses, no edema, no cyanosis, no clubbing Skin:  No rashes no nodules Neuro:  CN II through XII intact, motor grossly intact  DEVICE  Normal device function.  See PaceArt for details. She has reached elective replacement.  Assess/Plan:   

## 2012-03-09 ENCOUNTER — Ambulatory Visit (HOSPITAL_COMMUNITY): Payer: Medicare Other

## 2012-03-09 ENCOUNTER — Telehealth: Payer: Self-pay | Admitting: Internal Medicine

## 2012-03-09 MED ORDER — LOSARTAN POTASSIUM 25 MG PO TABS: 25.0000 mg | ORAL_TABLET | Freq: Every day | ORAL | Status: DC

## 2012-03-09 NOTE — Progress Notes (Signed)
Patient ID: Makayla Vasquez, female   DOB: 16-Nov-1980, 31 y.o.   MRN: [id number] Subjective:  C/o minimal incisional pain.  Objective:  Vital Signs in the last 24 hours: Temp:  [96.8 F (36 C)-98 F (36.7 C)] 98 F (36.7 C) (05/21 0344) Pulse Rate:  [65-88] 69  (05/21 0344) Resp:  [18-22] 20  (05/21 0344) BP: (97-140)/(51-93) 115/58 mmHg (05/21 0344) SpO2:  [98 %-100 %] 99 % (05/21 0344) Weight:  [222 lb (100.699 kg)] 222 lb (100.699 kg) (05/20 0841)  Intake/Output from previous day: 05/20 0701 - 05/21 0700 In: 700 [P.O.:600; IV Piggyback:100] Out: 700 [Urine:700] Intake/Output from this shift:    Physical Exam: Well appearing NAD HEENT: Unremarkable Neck:  No JVD, no thyromegally Lungs:  Clear with no wheezes. HEART:  Regular rate rhythm, no murmurs, no rubs, no clicks Abd:  Flat, positive bowel sounds, no organomegally, no rebound, no guarding Ext:  2 plus pulses, no edema, no cyanosis, no clubbing Skin:  No rashes no nodules Neuro:  CN II through XII intact, motor grossly intact  Lab Results:  Basename 03/08/12 0855  WBC 3.7*  HGB 13.5  PLT 206   No results found for this basename: NA:2,K:2,CL:2,CO2:2,GLUCOSE:2,BUN:2,CREATININE:2 in the last 72 hours No results found for this basename: TROPONINI:2,CK,MB:2 in the last 72 hours Hepatic Function Panel No results found for this basename: PROT,ALBUMIN,AST,ALT,ALKPHOS,BILITOT,BILIDIR,IBILI in the last 72 hours No results found for this basename: CHOL in the last 72 hours No results found for this basename: PROTIME in the last 72 hours  Imaging: No results found.  Cardiac Studies: Tele - NSR with BiV Pacing Assessment/Plan:  1. CHB 2. DCM 3. Chronic systolic heart failure 4. S/p BiV PPM upgrade Rec: will discharge home on beta blocker, arb. Usual followup.  LOS: 1 day    Lewayne Bunting 03/09/2012, 7:09 AM

## 2012-03-09 NOTE — Telephone Encounter (Signed)
New msg Pt said she was released from hospital and she wanted to get some pain medicine. [facility] uses [facility] on [address].

## 2012-03-09 NOTE — Telephone Encounter (Signed)
lmom for pt to try OTC Tylenol and Ibuprofen for pain per Dr Ladona Ridgel and the pain should get better

## 2012-03-09 NOTE — Discharge Summary (Signed)
ELECTROPHYSIOLOGY PROCEDURE DISCHARGE SUMMARY    Patient ID: Makayla Vasquez,  MRN: [id number], DOB/AGE: 1981-01-20 31 y.o.  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  Obesity 2.  GERD 3.  Seizures- as a child from high fever 4.  Anxiety 5.  Hypertension 6.  Bipolar affective disorder 7.  Depression  Device History- original pacemaker implantation at the age of 88 in 26, generator change in 2004, CRT-P upgrade 03-08-2012.  (All Medtronic devices)  Procedures This Admission:  1.  Upgrade of a previously implanted dual chamber pacemaker to a CRT-P device on 03-08-2012 by Dr Ladona Ridgel. The patient received a Medtronic device with model number [id number] left ventricular lead implanted.  The patient's previously implanted right atrial and right ventricular leads were used.  2.  CXR on 5-21 demonstrated no pneumothorax status post device implantation.   Brief HPI: [name] returns today for followup. She is a very pleasant 31 year old woman with a history of congenital complete heart block. She status post pacemaker insertion, initially in 1997. She underwent generator change in 2004. She has reached elective replacement indication. In 2011, she was found to have left ventricular dysfunction with an ejection fraction of 35-40% but was asymptomatic. She developed chest pain and shortness of breath approximately 4 months ago and repeat echo demonstrated an ejection fraction of 20-25%. She was placed on carvedilol and a repeat echo demonstrates improvement in her EF 30-35%. Her CHF symptoms are class II. In addition, the patient's ventricular pacing lead has had a reduction in pacing impedance. She denies syncope. Minimal peripheral edema.  Risks, benefits,  and alternatives to pacemaker generator change with upgrade to a CRTP device were reviewed with the patient and she wished to proceed.   Hospital Course:  The patient was admitted and underwent upgrade to a CRTP with details as outlined above.   She was monitored on telemetry overnight which demonstrated sinus rhythm with ventricular pacing.  Left chest was without hematoma or ecchymosis.  The device was interrogated and found to be functioning normally.  CXR was obtained and demonstrated no pneumothorax status post device implantation.  Wound care, arm mobility, and restrictions were reviewed with the patient.  Dr Ladona Ridgel examined the patient and considered them stable for discharge to home.    Discharge Vitals: Blood pressure 115/70, pulse 60, temperature 97.7 F (36.5 C), temperature source Oral, resp. rate 20, height 5' 5.5" (1.664 m), weight 222 lb (100.699 kg), SpO2 100.00%.    Labs:   Lab Results  Component Value Date   WBC 3.7* 03/08/2012   HGB 13.5 03/08/2012   HCT 40.9 03/08/2012   MCV 84.5 03/08/2012   PLT 206 03/08/2012    Lab 03/03/12 1524  NA 140  K 4.0  CL 107  CO2 23  BUN 12  CREATININE 0.94  CALCIUM 9.4  PROT 7.7  BILITOT 0.8  ALKPHOS 64  ALT 26  AST 26  GLUCOSE 97   Discharge Medications:  Medication List  As of 03/09/2012  8:56 AM   TAKE these medications         carvedilol 3.125 MG tablet   Commonly known as: COREG   Take 3.125 mg by mouth 2 (two) times daily.      losartan 25 MG tablet   Commonly known  as: COZAAR   Take 1 tablet (25 mg total) by mouth daily.            Disposition:  Discharge Orders    Future Appointments: Provider: Department: Dept Phone: Center:   03/18/2012 11:00 AM [facility] [phone] [facility]   06/08/2012 3:30 PM Marinus Maw, MD [facility] [phone] [facility]     Future Orders Please Complete By Expires   Diet - low sodium heart healthy      Increase activity slowly       Call MD for:  temperature >100.4      Call MD for:  severe uncontrolled pain      Call MD for:  redness, tenderness, or signs of infection (pain, swelling, redness, odor or green/yellow discharge around incision site)      (HEART FAILURE PATIENTS) Call MD:  Anytime you have any of the following symptoms: 1) 3 pound weight gain in 24 hours or 5 pounds in 1 week 2) shortness of breath, with or without a dry hacking cough 3) swelling in the hands, feet or stomach 4) if you have to sleep on extra pillows at night in order to breathe.        Follow-up Information    Follow up with [facility] (May 30th at 11am)    Contact information:   [address] [place] [address]       Follow up with Makayla Bunting, MD. (August 20th at 3:30 pm)    Contact information:   [address] [place] [address] [phone]          Duration of Discharge Encounter: Greater than 30 minutes including physician time.  Signed, Gypsy Balsam, RN, BSN 03/09/2012, 8:56 AM

## 2012-03-09 NOTE — Discharge Instructions (Signed)
   Supplemental Discharge Instructions for  Pacemaker/Defibrillator Patients  Activity No heavy lifting or vigorous activity with your left/right arm for 6 to 8 weeks.  Do not raise your left/right arm above your head for one week.  Gradually raise your affected arm as drawn below.                       May 23rd                   May 24th                      May 25th                 May 26th   NO DRIVING for  1 week    ; you may begin driving on     May 27 th    . WOUND CARE   Keep the wound area clean and dry.  Do not get this area wet for one week. No showers for one week; you may shower on      May 27th        .   The tape/steri-strips on your wound will fall off; do not pull them off.  No bandage is needed on the site.  DO  NOT apply any creams, oils, or ointments to the wound area.   If you notice any drainage or discharge from the wound, any swelling or bruising at the site, or you develop a fever > 101? F after you are discharged home, call the office at once.  Special Instructions   You are still able to use cellular telephones; use the ear opposite the side where you have your pacemaker/defibrillator.  Avoid carrying your cellular phone near your device.   When traveling through airports, show security personnel your identification card to avoid being screened in the metal detectors.  Ask the security personnel to use the hand wand.   Avoid arc welding equipment, MRI testing (magnetic resonance imaging), TENS units (transcutaneous nerve stimulators).  Call the office for questions about other devices.   Avoid electrical appliances that are in poor condition or are not properly grounded.   Microwave ovens are safe to be near or to operate.  Additional information for defibrillator patients should your device go off:   If your device goes off ONCE and you feel fine afterward, notify the device clinic nurses.   If your device goes off ONCE and you do not feel well afterward, call  911.   If your device goes off TWICE, call 911.   If your device goes off THREE times in one day, call 911.  DO NOT DRIVE YOURSELF OR A FAMILY MEMBER WITH A DEFIBRILLATOR TO THE HOSPITAL--CALL 911.

## 2012-03-18 ENCOUNTER — Encounter: Payer: Self-pay | Admitting: Internal Medicine

## 2012-03-18 ENCOUNTER — Ambulatory Visit (INDEPENDENT_AMBULATORY_CARE_PROVIDER_SITE_OTHER): Payer: Medicare Other | Admitting: *Deleted

## 2012-03-18 DIAGNOSIS — I459 Conduction disorder, unspecified: Secondary | ICD-10-CM

## 2012-03-18 DIAGNOSIS — I5022 Chronic systolic (congestive) heart failure: Secondary | ICD-10-CM

## 2012-03-18 LAB — PACEMAKER DEVICE OBSERVATION
AL IMPEDENCE PM: 380 Ohm
AL THRESHOLD: 0.75 V
ATRIAL PACING PM: 26.14
BAMS-0001: 170 {beats}/min
RV LEAD THRESHOLD: 1.5 V
VENTRICULAR PACING PM: 98.76

## 2012-03-18 NOTE — Progress Notes (Signed)
Wound check-PPM 

## 2012-04-07 ENCOUNTER — Telehealth: Payer: Self-pay | Admitting: Internal Medicine

## 2012-04-07 NOTE — Telephone Encounter (Signed)
She says her chest is sore with movement.  She has been picking up her son a lot who weighs 20 pounds   I have advised her to use her right arm in picking him up  She is aware and is going to take Tylenol every 6 hours as needed for the pain

## 2012-04-07 NOTE — Telephone Encounter (Signed)
New msg Pt said she thinks she has pulled a muscle in her chest. She had pacemaker surgery three weeks ago. Please call

## 2012-04-27 IMAGING — US US OB COMP LESS 14 WK
1 series · 14 of 28 positions shown · non-contrast
Comparison: none

OBSTETRICAL ULTRASOUND:
 This ultrasound exam was performed in the [HOSPITAL] Ultrasound Department.  The OB US report was generated in the AS system, and faxed to the ordering physician.  This report is also available in [HOSPITAL]?s Access[employer]ware and in [REDACTED] PACS.

[Series 1: us ob comp less 14 wks · 0.17mm/px · 14 of 30 slices shown]
[im 2/30]
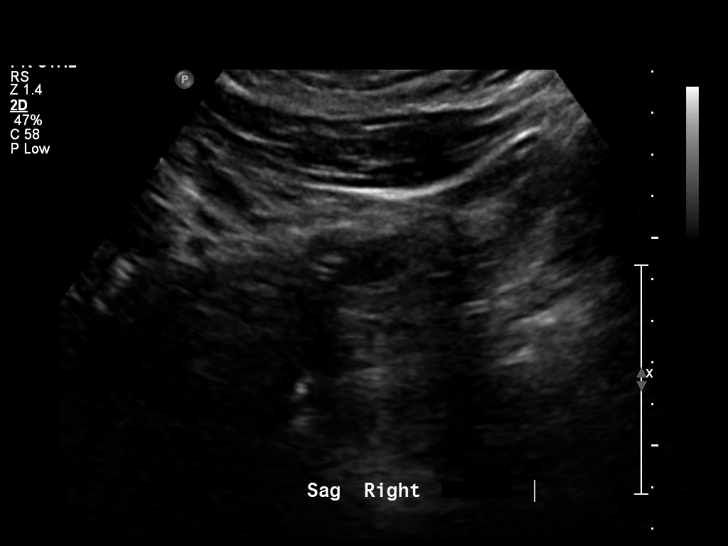
[im 4/30]
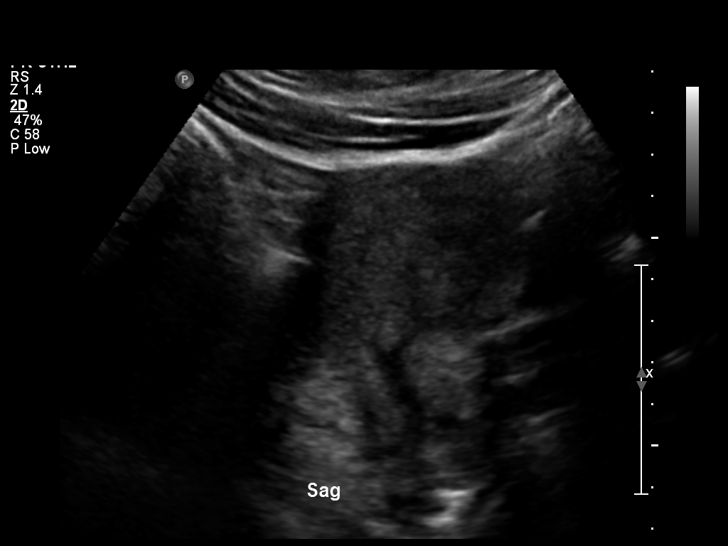
[im 6/30]
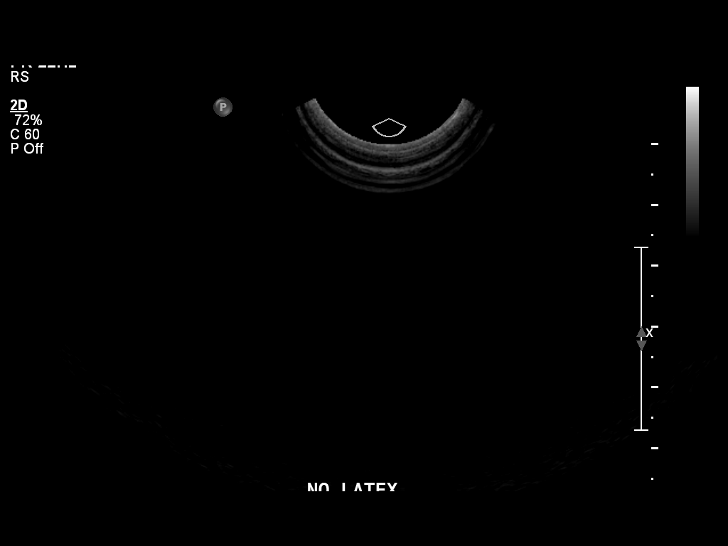
[im 8/30]
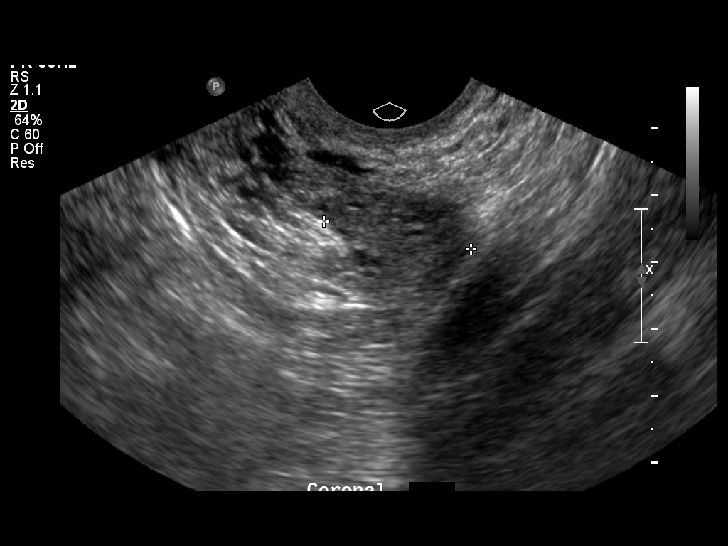
[im 10/30]
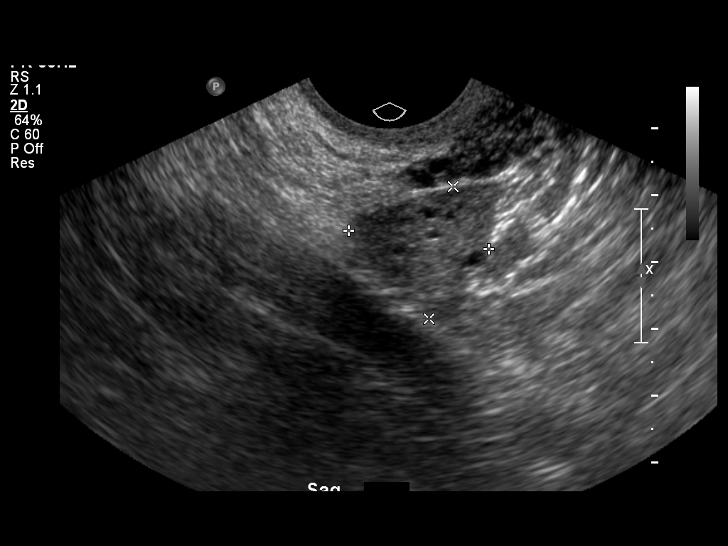
[im 12/30]
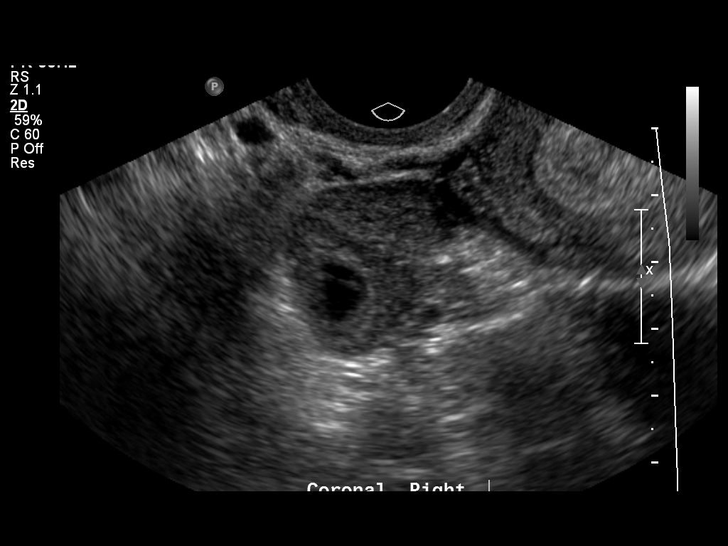
[im 14/30]
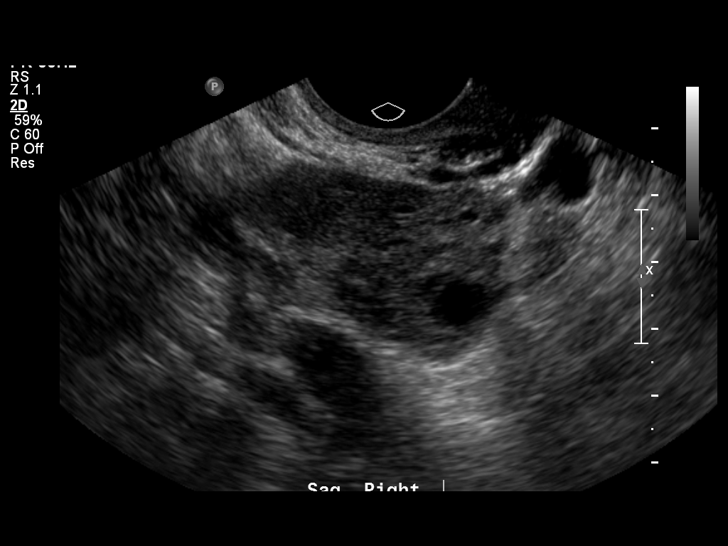
[im 17/30]
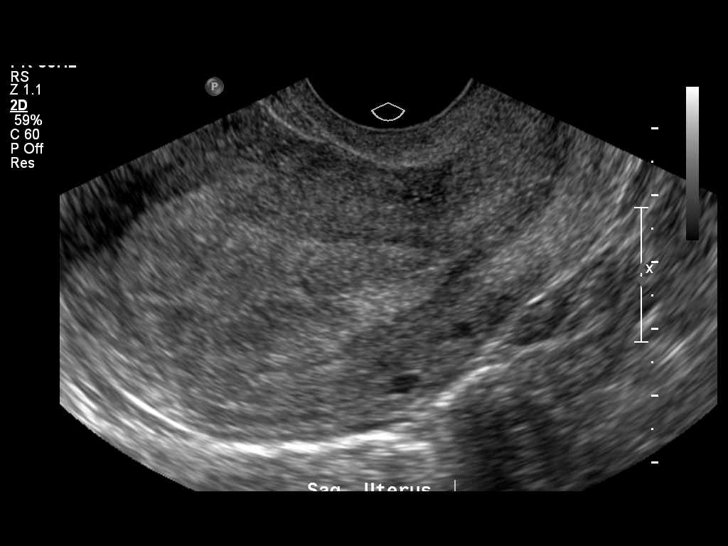
[im 19/30]
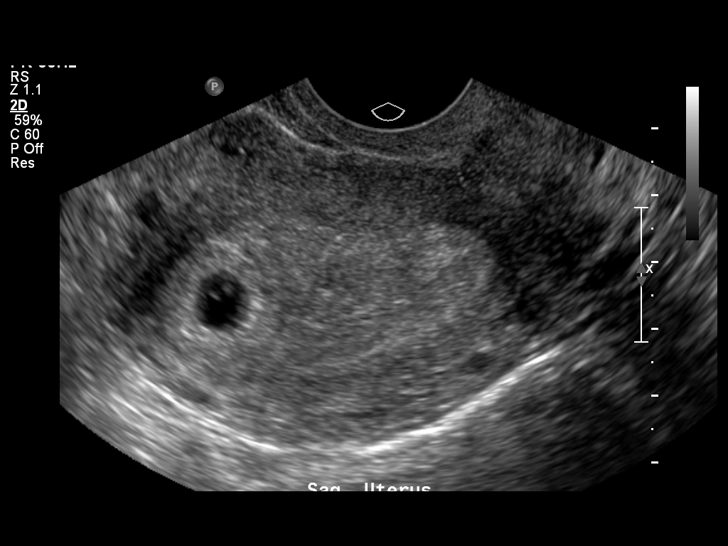
[im 21/30]
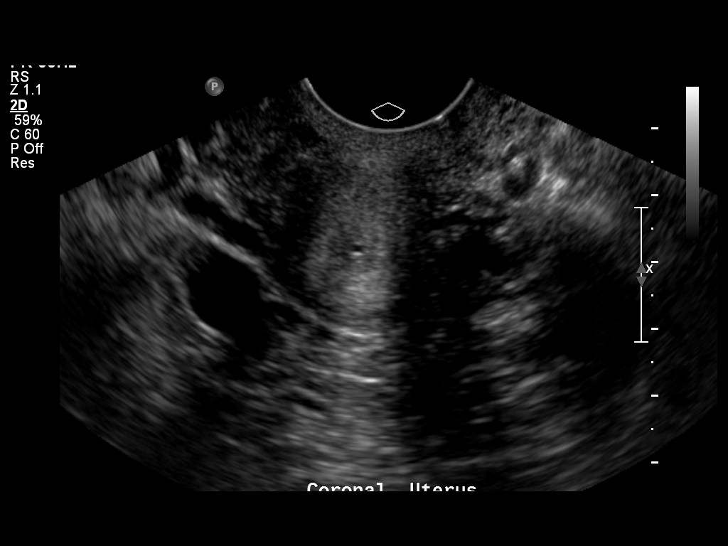
[im 23/30]
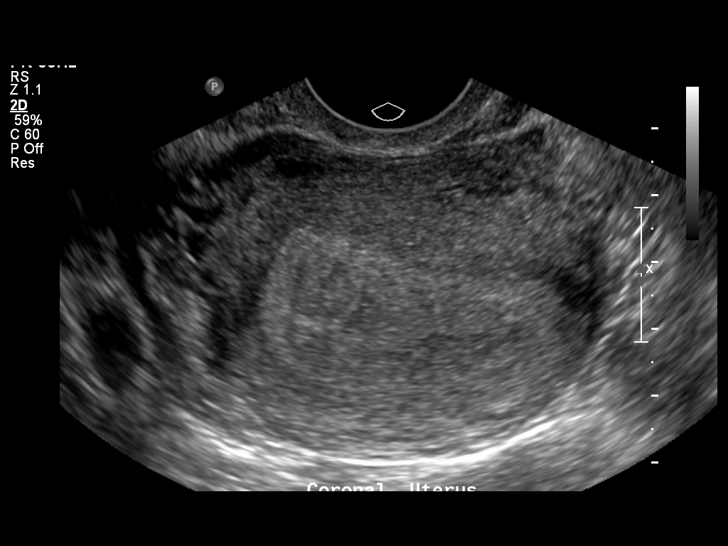
[im 25/30]
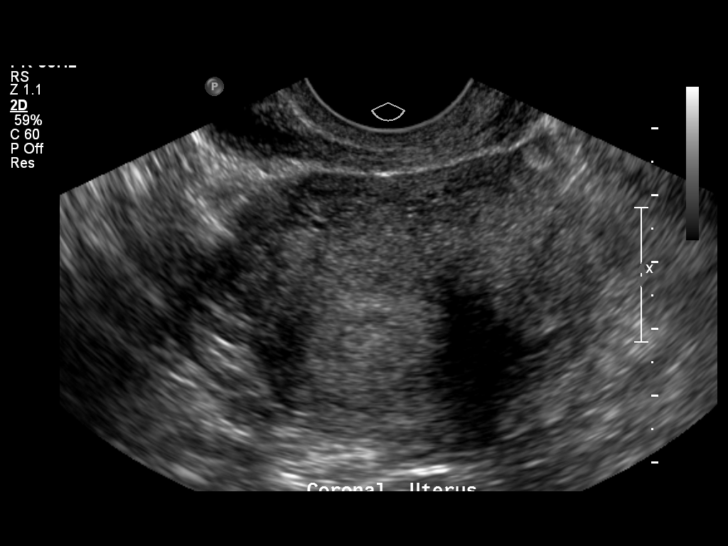
[im 27/30]
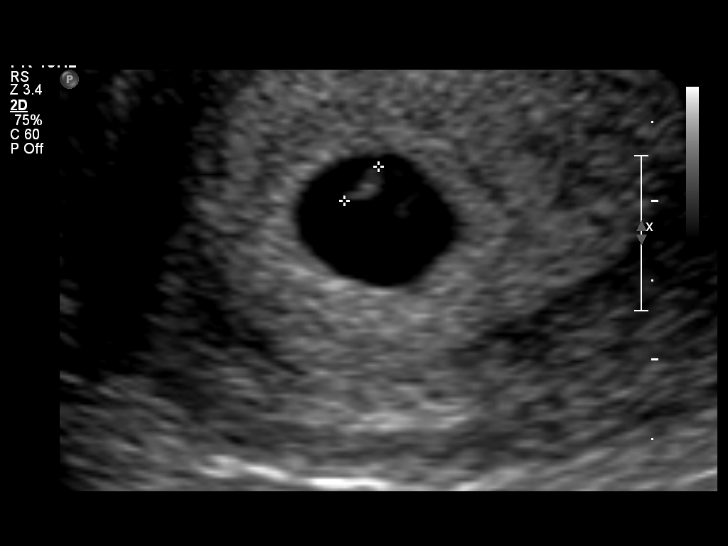
[im 30/30]
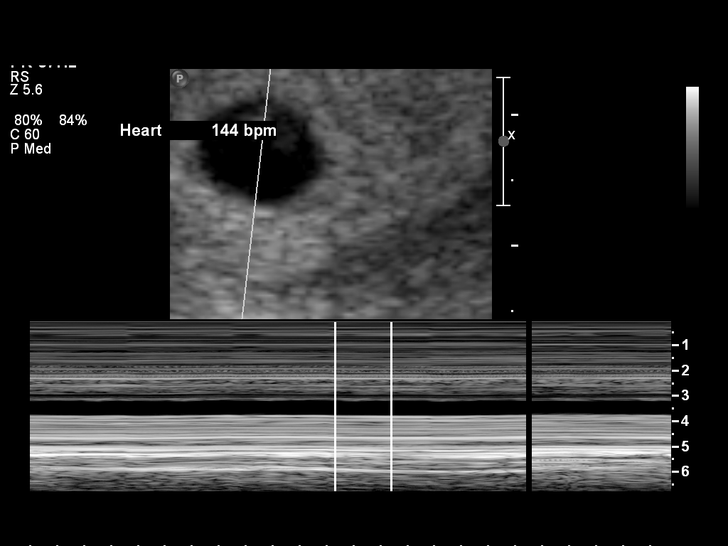

[14 of 28 positions shown; findings below may reference images not displayed]

IMPRESSION: See AS Obstetric US report.

## 2012-04-27 IMAGING — CR DG CHEST 2V
2 series · 2 of 2 positions shown · non-contrast
Comparison: 11/20/2009.

CLINICAL DATA: Flu symptoms.  Fever. Sore throat.  Cough.

CHEST - 2 VIEW

[w chest pa]
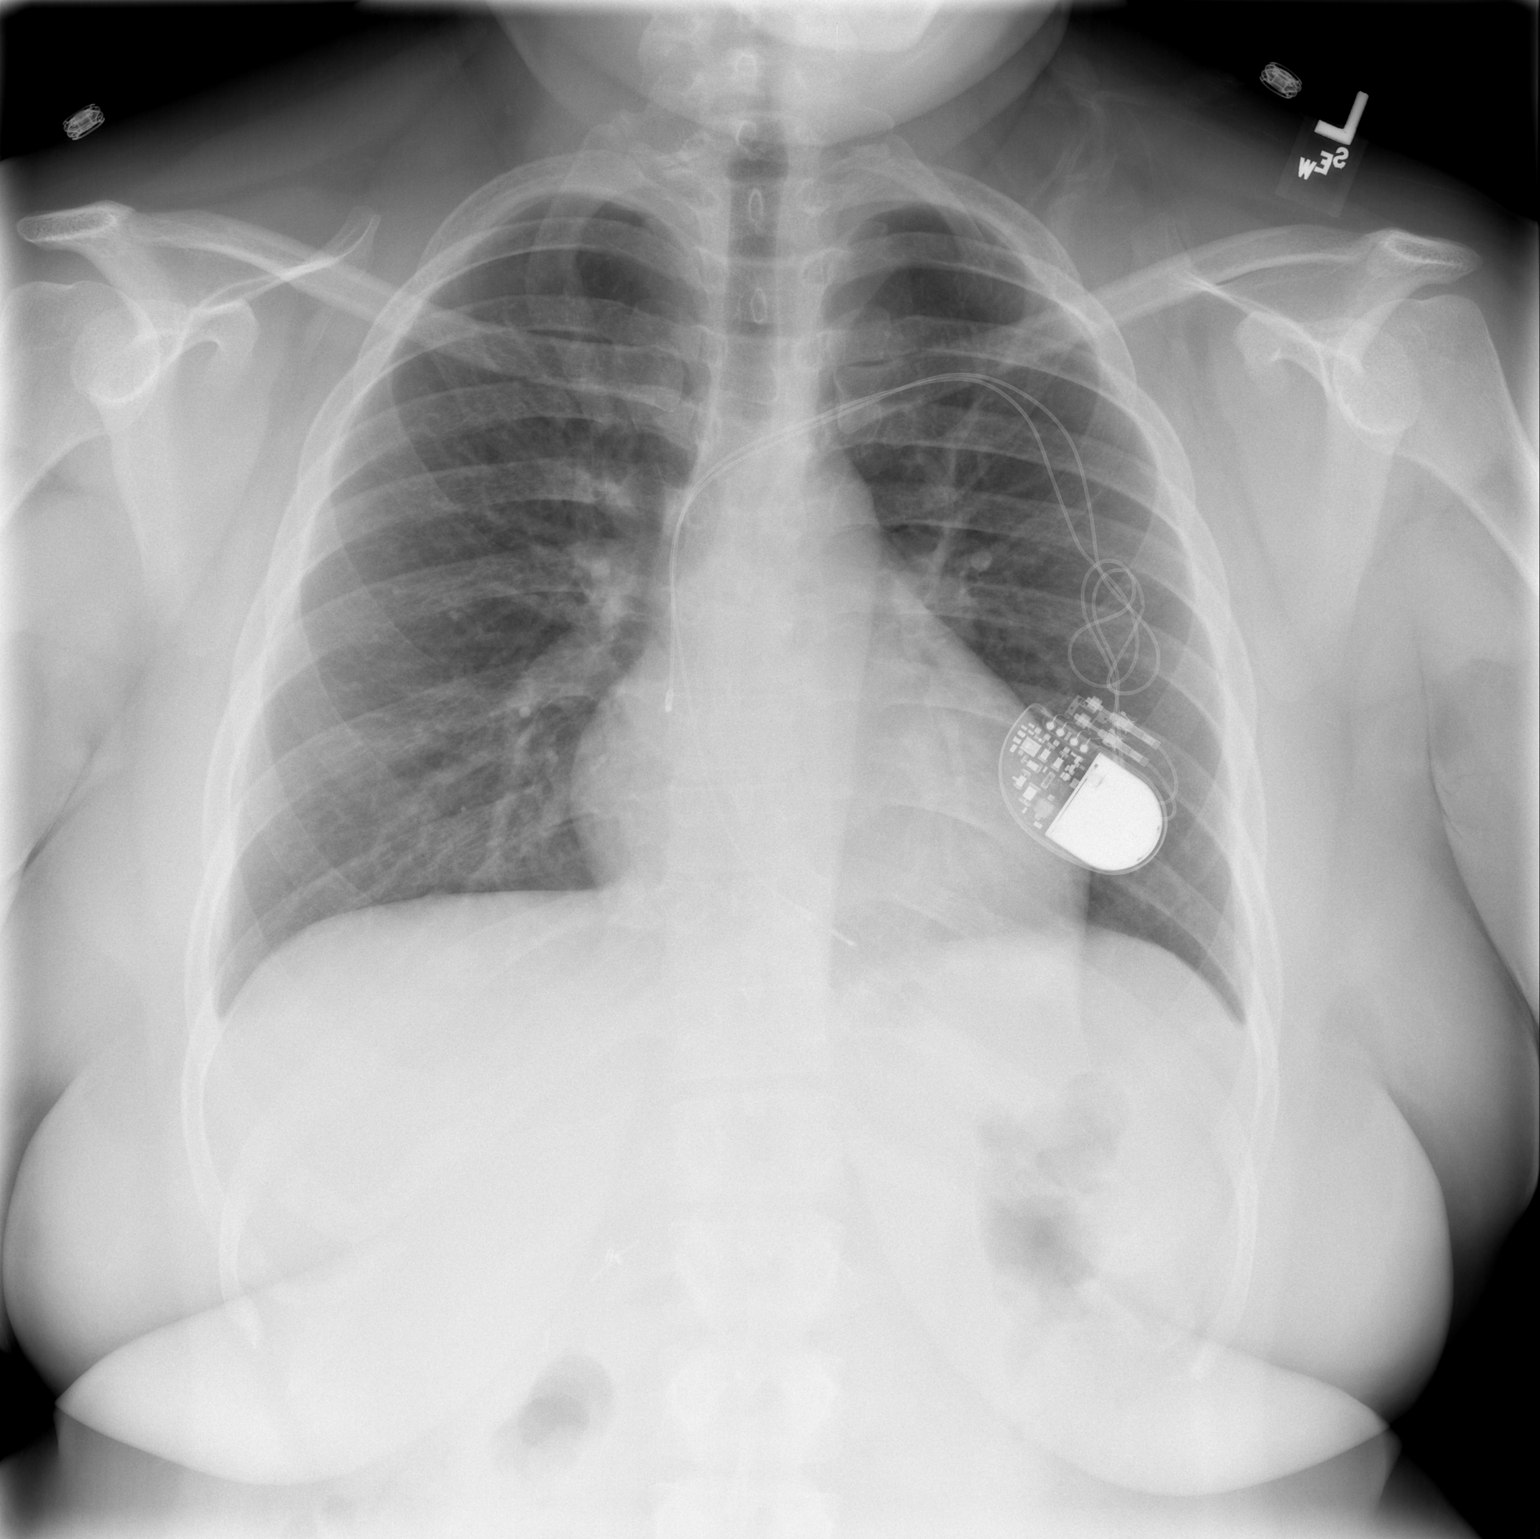

[w chest lat]
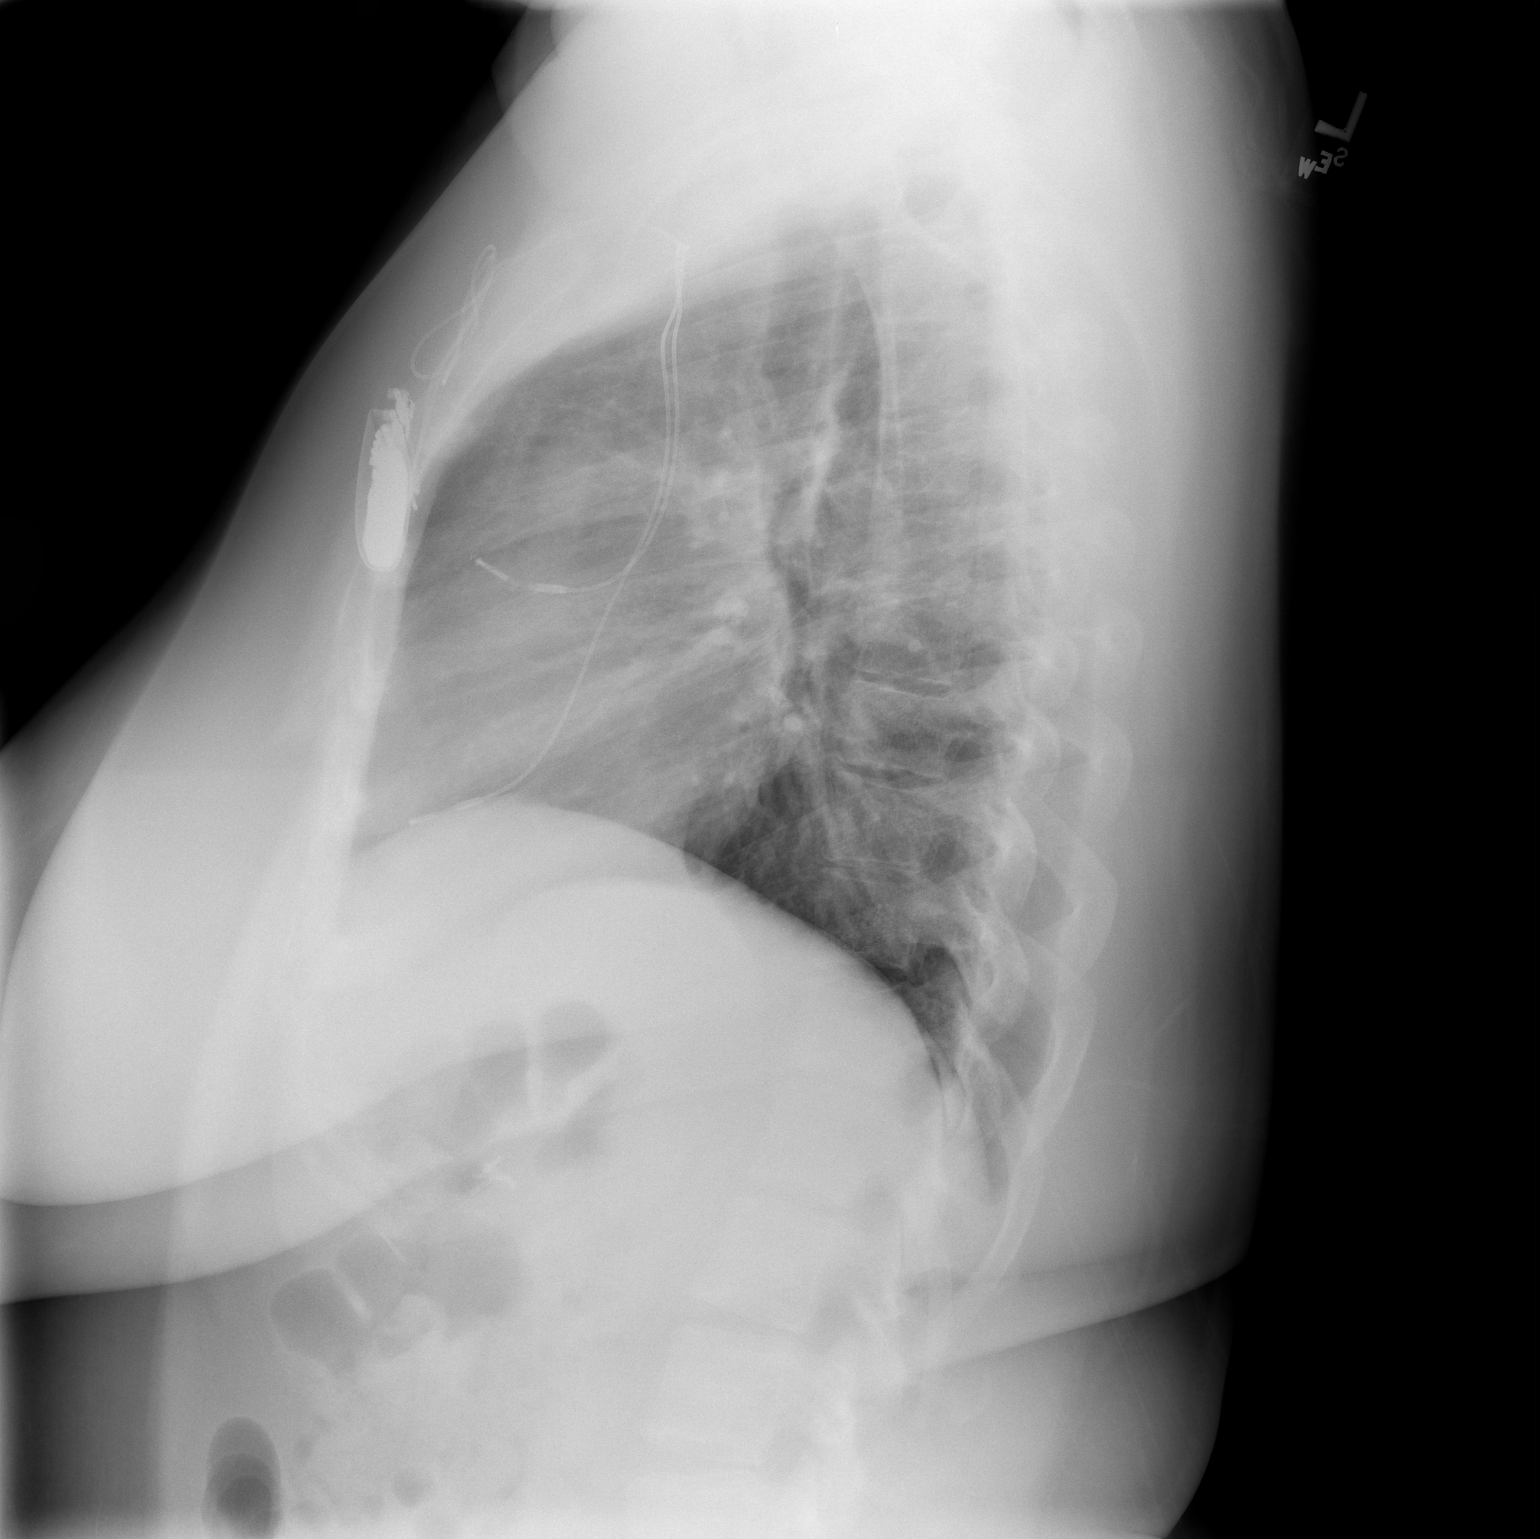

[2 of 2 positions shown; findings below may reference images not displayed]

FINDINGS: Lungs clear.  Cardiopericardial silhouette within normal
limits.  Trachea midline.  No airspace disease or effusion.

Dual lead left subclavian cardiac pacemaker appears unchanged
compared to prior.  Cholecystectomy clips in the right upper
quadrant.
IMPRESSION: No active cardiopulmonary disease.

## 2012-05-18 ENCOUNTER — Ambulatory Visit (INDEPENDENT_AMBULATORY_CARE_PROVIDER_SITE_OTHER): Payer: Medicare Other | Admitting: *Deleted

## 2012-05-18 DIAGNOSIS — Z111 Encounter for screening for respiratory tuberculosis: Secondary | ICD-10-CM

## 2012-05-20 LAB — TB SKIN TEST: TB Skin Test: NEGATIVE

## 2012-06-07 IMAGING — US US OB COMP LESS 14 WK
1 series · 14 of 17 positions shown · non-contrast
Comparison: none

OBSTETRICAL ULTRASOUND:
 This ultrasound exam was performed in the [HOSPITAL] Ultrasound Department.  The OB US report was generated in the AS system, and faxed to the ordering physician.  This report is also available in [HOSPITAL]?s [employer] and in [REDACTED] PACS.

[Series 1: us ob comp less 14 wks · 0.21mm/px · 17 acquisitions, 14 frames shown]
[im 1/17]
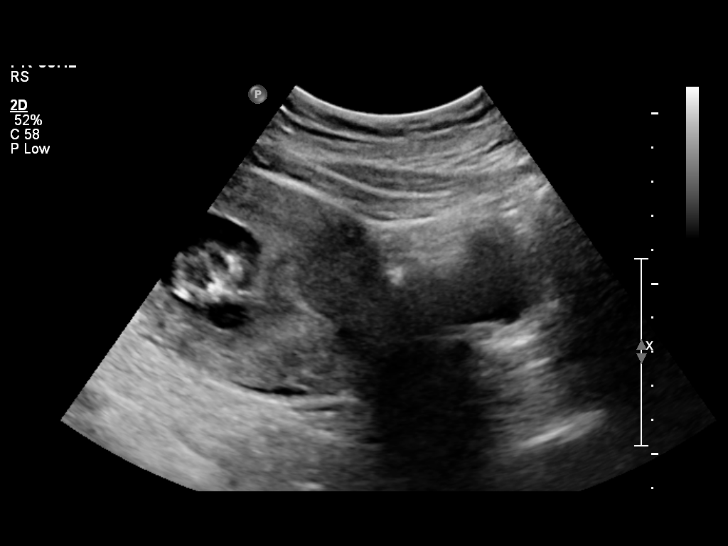
[im 2/17]
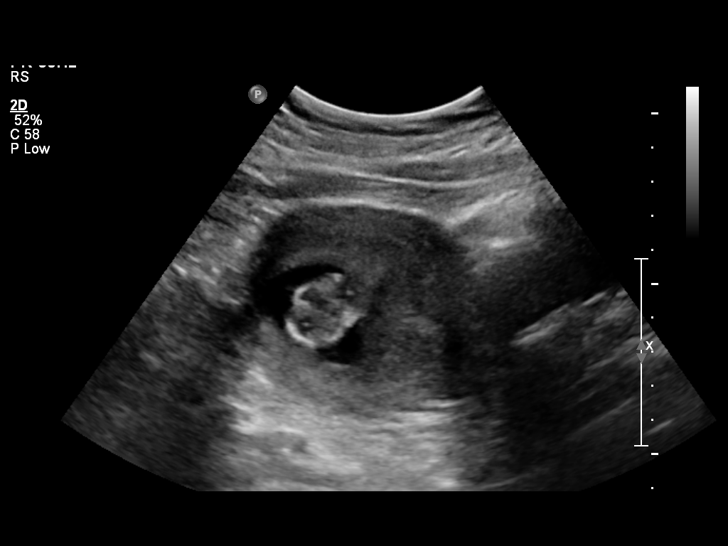
[im 4/17]
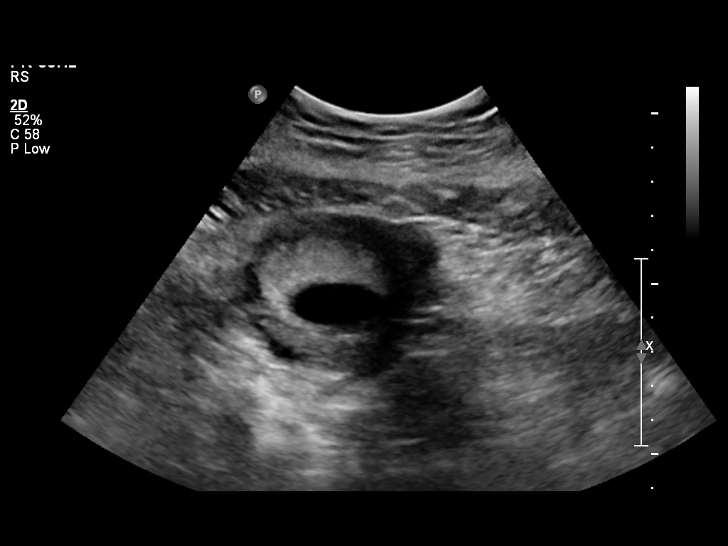
[im 5/17]
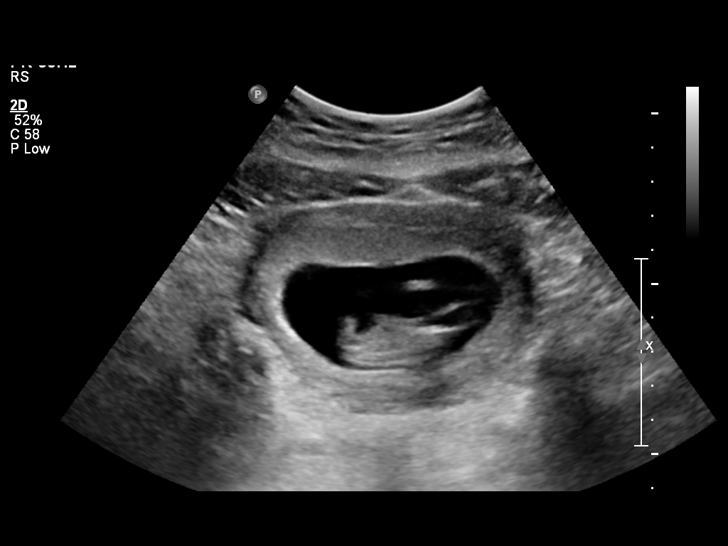
[im 6/17]
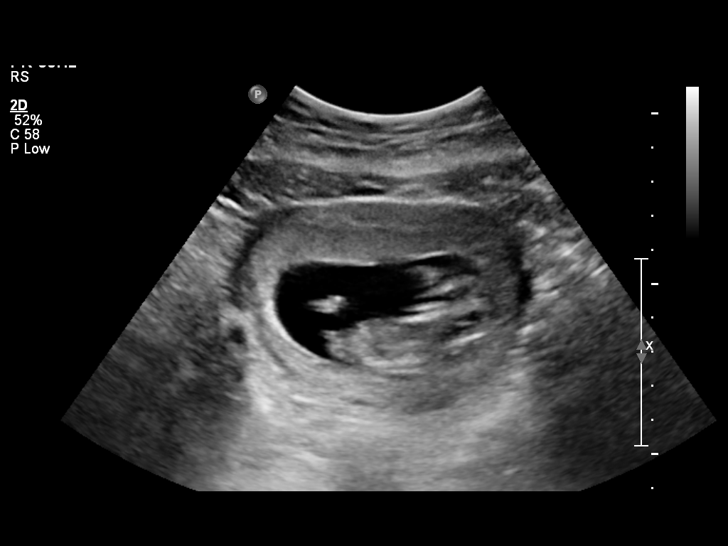
[im 7/17]
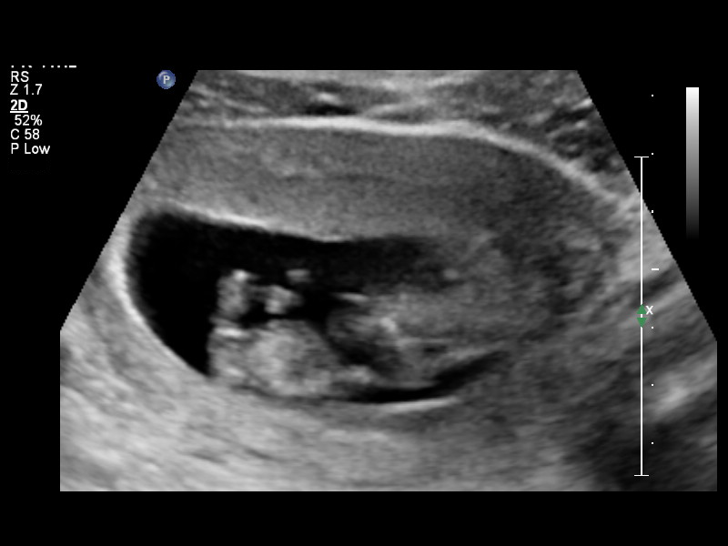
[im 8/17]
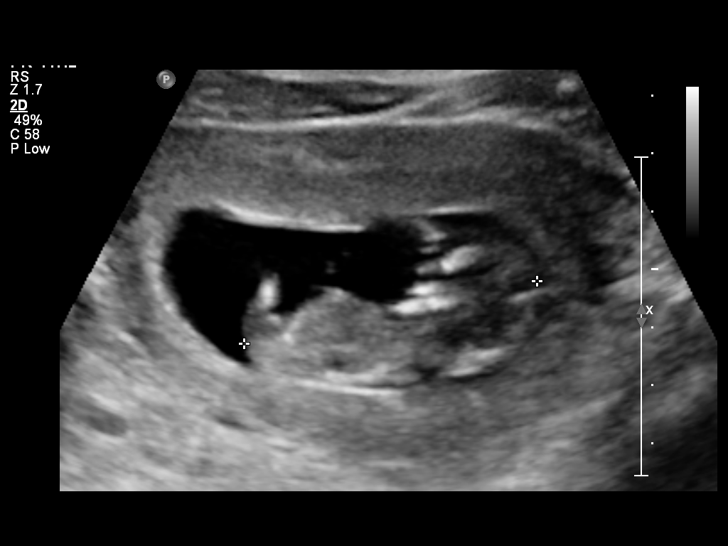
[im 10/17]
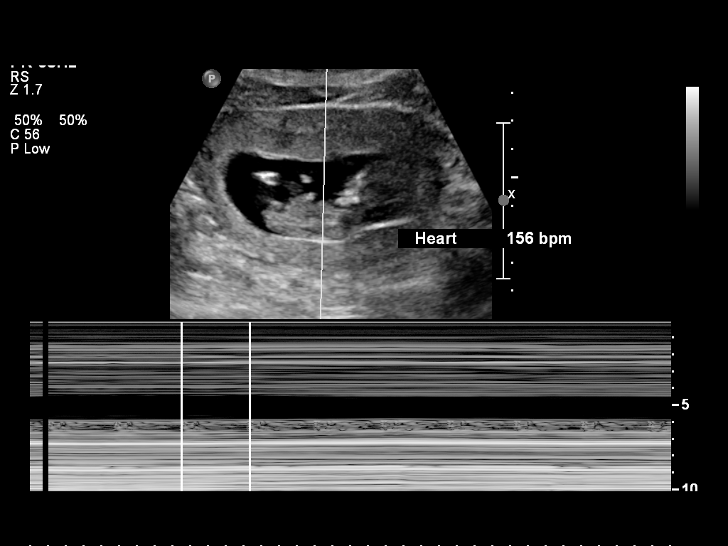
[im 11/17]
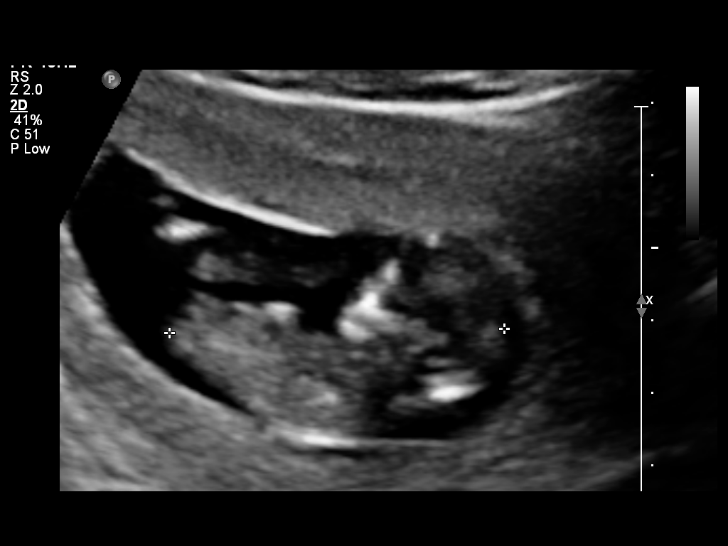
[im 12/17]
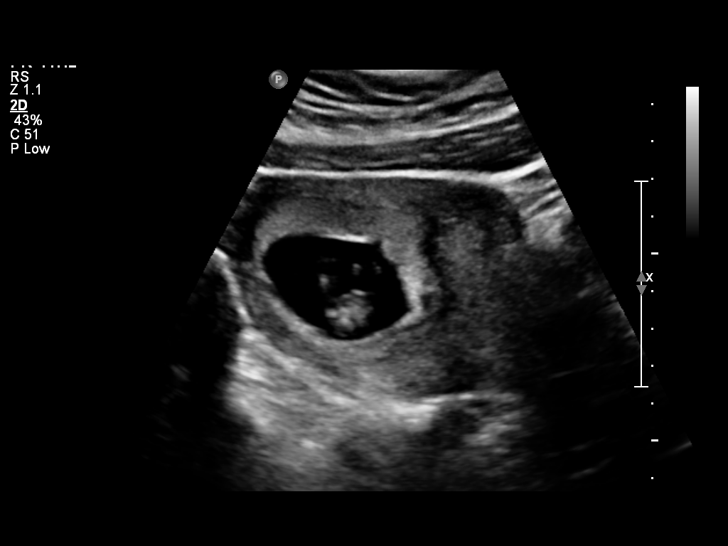
[im 13/17]
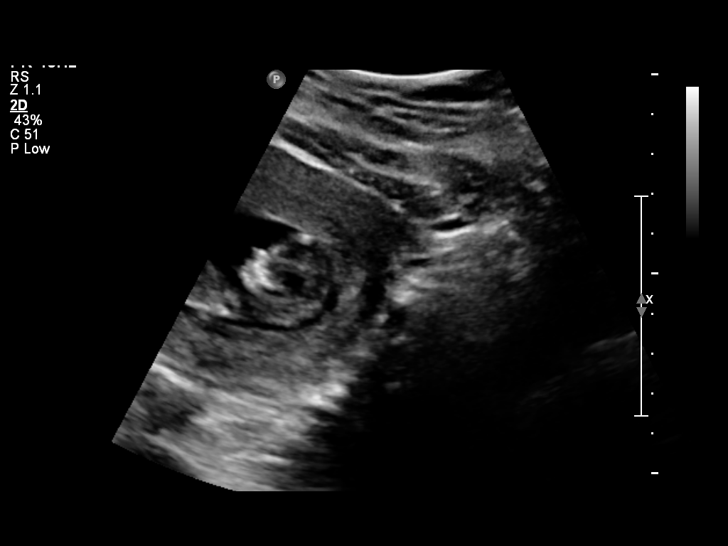
[im 14/17]
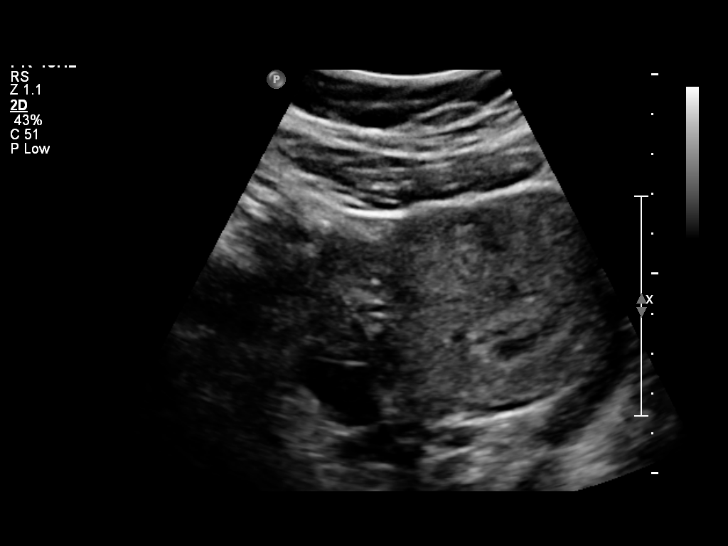
[im 16/17]
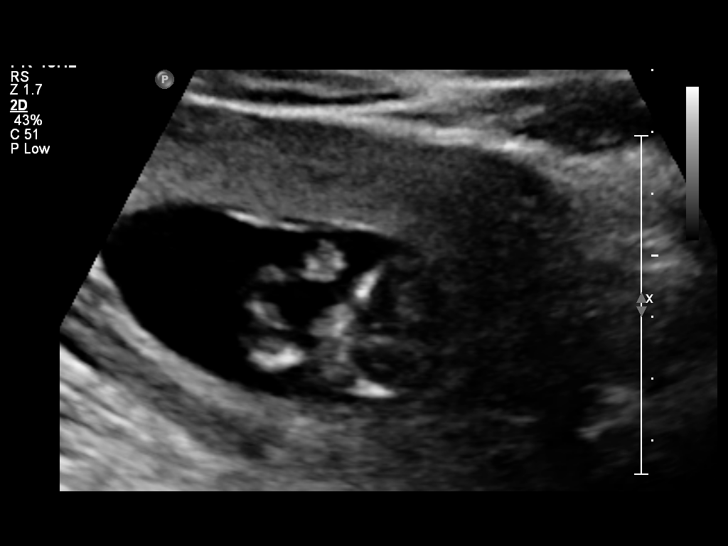
[im 17/17]
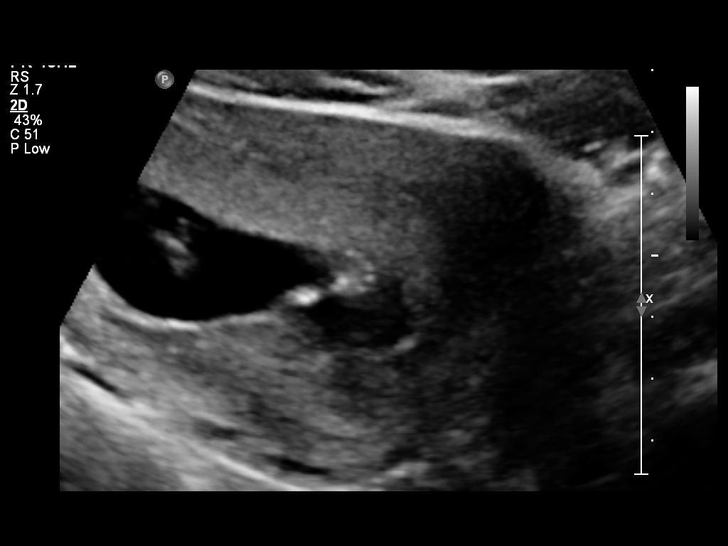

[14 of 17 positions shown; findings below may reference images not displayed]

IMPRESSION: See AS Obstetric US report.

## 2012-06-08 ENCOUNTER — Encounter: Payer: Self-pay | Admitting: Cardiology

## 2012-06-08 ENCOUNTER — Encounter: Payer: Self-pay | Admitting: Internal Medicine

## 2012-06-08 ENCOUNTER — Ambulatory Visit (INDEPENDENT_AMBULATORY_CARE_PROVIDER_SITE_OTHER): Payer: Medicare Other | Admitting: Cardiology

## 2012-06-08 VITALS — BP 116/58 | HR 64 | Ht 66.0 in | Wt 226.0 lb

## 2012-06-08 DIAGNOSIS — I499 Cardiac arrhythmia, unspecified: Secondary | ICD-10-CM

## 2012-06-08 DIAGNOSIS — Z950 Presence of cardiac pacemaker: Secondary | ICD-10-CM

## 2012-06-08 DIAGNOSIS — I442 Atrioventricular block, complete: Secondary | ICD-10-CM

## 2012-06-08 DIAGNOSIS — I5022 Chronic systolic (congestive) heart failure: Secondary | ICD-10-CM

## 2012-06-08 DIAGNOSIS — I509 Heart failure, unspecified: Secondary | ICD-10-CM

## 2012-06-08 LAB — PACEMAKER DEVICE OBSERVATION
AL IMPEDENCE PM: 380 Ohm
ATRIAL PACING PM: 28.7
BAMS-0001: 170 {beats}/min
RV LEAD IMPEDENCE PM: 266 Ohm
VENTRICULAR PACING PM: 99.7

## 2012-06-08 NOTE — Progress Notes (Signed)
ELECTROPHYSIOLOGY OFFICE NOTE  Patient ID: Makayla Vasquez MRN: [id number], DOB/AGE: June 23, 1981   Date of Visit: 06/08/2012  Primary Physician: Despina Hick, MD Primary Cardiologist: Lewayne Bunting, MD Reason for Visit: Device follow-up  History of Present Illness Makayla Vasquez is a pleasant 31 year old woman with congenital heart block s/p PPM implant 1997 who was recently found to have LV dysfunction with an EF 20-25% and underwent upgrade to BiV PPM. She presents today for routine device follow-up. She states she feels "about the same." She is busy caring for her one-year old son and states she is not limited with her activities. She denies CP or SOB. She continues to have intermittent palpitations where she feel her heart racing but these episodes are brief and not accompanied by any other symptoms. She denies dizziness, near syncope or syncope.  She denies LE swelling, orthopnea or PND.   Past Medical History  Diagnosis Date  . Heart palpitations   . Cardiac pacemaker     Since age 46. Last generator change in 2004  . Congenital complete AV block   . Obesity   . GERD (gastroesophageal reflux disease)   . Asymptomatic LV dysfunction     Echo in Dec 2011 with EF 35 to 40%. Felt to be due to paced rhythm  . Seizures     as a child- from high fever  . Anxiety   . Hypertension   . Bipolar affective disorder   . Depression     bipolar  . Pacemaker     Past Surgical History  Procedure Date  . Throat surgery 1994    s/p laser treatment  . Cesarean section   . Insert / replace / remove pacemaker     2001  . Cholecystectomy   . Iud removal 11/03/2011    Procedure: INTRAUTERINE DEVICE (IUD) REMOVAL;  Surgeon: Makayla C. Marice Potter, MD;  Location: [facility];  Service: Gynecology;  Laterality: N/A;     Allergies/Intolerances Allergies  Allergen Reactions  . Sertraline Hcl     REACTION: hives    Current Home Medications Current Outpatient Prescriptions  Medication Sig Dispense Refill  .  carvedilol (COREG) 3.125 MG tablet Take 3.125 mg by mouth 2 (two) times daily.      [name] losartan (COZAAR) 25 MG tablet Take 1 tablet (25 mg total) by mouth daily.  30 tablet  11  . DISCONTD: cetirizine (ZYRTEC) 10 MG tablet Take 1 tablet (10 mg total) by mouth daily.  30 tablet  11  . DISCONTD: fluticasone (FLONASE) 50 MCG/ACT nasal spray Place 2 sprays into the nose daily.  16 g  6    Social History Social History  . Marital Status: Single    Spouse Name: N/A    Number of Children: 1   Occupational History  . CNA    Social History Main Topics  . Smoking status: Former Smoker    Types: Cigarettes  . Smokeless tobacco: Not on file  . Alcohol Use: No  . Drug Use: No  . Sexually Active: Yes    Birth Control/ Protection: IUD, Implant   Review of Systems General: No chills, fever, night sweats or weight changes Cardiovascular: No chest pain, dyspnea on exertion, edema, orthopnea, palpitations, paroxysmal nocturnal dyspnea Dermatological: No rash, lesions or masses Respiratory: No cough, dyspnea Urologic: No hematuria, dysuria Abdominal: No nausea, vomiting, diarrhea, bright red blood per rectum, melena, or hematemesis Neurologic: No visual changes, weakness, changes in mental status All other systems reviewed and  are otherwise negative except as noted above.  Physical Exam Blood pressure 116/58, pulse 64, height 5\' 6"  (1.676 m), weight 226 lb (102.513 kg).  General: Well developed, well appearing 31 year old female in no acute distress. HEENT: Normocephalic, atraumatic. EOMs intact. Sclera nonicteric. Oropharynx clear.  Neck: Supple. No JVD. Lungs: Respirations regular and unlabored, CTA bilaterally. No wheezes, rales or rhonchi. Heart: RRR. S1, S2 present. No murmurs, rub, S3 or S4. Abdomen: Soft, non-distended. Extremities: No clubbing, cyanosis or edema.  Psych: Normal affect. Neuro: Alert and oriented X 3. Moves all extremities spontaneously.   Diagnostics Device  interrogation shows normal BiV PPM function with good battery status and stable lead measurements/parameters; patient is biventricularly paced ~99.5%; 7 AT/AF episodes, longest ~11 min (<0.1% of the time) - ? atrial tachycardia; no ventricular episodes; see PaceArt report  Assessment and Plan 1. Congenital heart block s/p PPM with recent upgrade to BiV PPM - normal device function; continue routine device follow-up every 3 months 2. Chronic systolic CHF - euvolemic by exam today; continue medical therapy; follow-up with Dr. Ladona Ridgel in 6 months unless needed sooner  [name] expressed verbal understanding and agrees with this plan of care. Signed, Makayla Duff, PA-C 06/08/2012, 3:25 PM

## 2012-06-08 NOTE — Patient Instructions (Addendum)
Your physician wants you to follow-up in: May of 2014 with Dr Ladona Ridgel.  You will receive a reminder letter in the mail two months in advance. If you don't receive a letter, please call our office to schedule the follow-up appointment.

## 2012-06-30 ENCOUNTER — Encounter: Payer: Self-pay | Admitting: Cardiology

## 2012-08-17 ENCOUNTER — Ambulatory Visit (INDEPENDENT_AMBULATORY_CARE_PROVIDER_SITE_OTHER): Payer: Medicare Other | Admitting: Family Medicine

## 2012-08-17 ENCOUNTER — Encounter: Payer: Self-pay | Admitting: Family Medicine

## 2012-08-17 ENCOUNTER — Other Ambulatory Visit (HOSPITAL_COMMUNITY)
Admission: RE | Admit: 2012-08-17 | Discharge: 2012-08-17 | Disposition: A | Discharge status: Home / Self Care | Payer: Medicare Other | Source: Ambulatory Visit | Attending: Family Medicine | Admitting: Family Medicine

## 2012-08-17 VITALS — BP 123/73 | HR 73 | Temp 98.8°F | Ht 66.0 in | Wt 227.0 lb

## 2012-08-17 DIAGNOSIS — Z113 Encounter for screening for infections with a predominantly sexual mode of transmission: Secondary | ICD-10-CM | POA: Insufficient documentation

## 2012-08-17 DIAGNOSIS — N760 Acute vaginitis: Secondary | ICD-10-CM

## 2012-08-17 LAB — POCT WET PREP (WET MOUNT): WBC, Wet Prep HPF POC: <5

## 2012-08-17 NOTE — Patient Instructions (Signed)
We will call you with results tomorrow.

## 2012-08-17 NOTE — Progress Notes (Signed)
Patient ID: BARBY COLVARD, female   DOB: [birth year], 31 y.o.   MRN: [id number] Subjective: The patient is a 31 y.o. year old female who presents today for vaginal discharge.  Vaginitis: Patient complains of an abnormal vaginal discharge for 1 week. Vaginal symptoms include discharge described as white and malodorous.Vulvar symptoms include none.STI Risk: Possible STD exposureDischarge described as: white and malodorous.Other associated symptoms: none.Menstrual pattern: She had been bleeding none. Contraception: Nexplanon  Just found out significant other has been having sex with others.  Patient's past medical, social, and family history were reviewed and updated as appropriate. History  Substance Use Topics  . Smoking status: Former Smoker    Types: Cigarettes  . Smokeless tobacco: Not on file  . Alcohol Use: No   Objective:  Filed Vitals:   08/17/12 0917  BP: 123/73  Pulse: 73  Temp: 98.8 F (37.1 C)   Gen: NAD, morbidly obese Pelvic exam: normal external genitalia, vulva, vagina, cervix, uterus and adnexa.  No CMT.  White discharge, mildly maloderous  Assessment/Plan: STD check.  Most likely diagnosis is BV given exam.  GC/Chl ordered.  Pt not interested in HIV/RPR.  Will call patient with results.  Please also see individual problems in problem list for problem-specific plans.

## 2012-08-19 ENCOUNTER — Ambulatory Visit: Payer: Medicare Other

## 2012-08-23 ENCOUNTER — Telehealth: Payer: Self-pay | Admitting: Family Medicine

## 2012-08-23 MED ORDER — METRONIDAZOLE 500 MG PO TABS: 500.0000 mg | ORAL_TABLET | Freq: Two times a day (BID) | ORAL | Status: DC

## 2012-08-23 NOTE — Telephone Encounter (Signed)
Called patient and informed her of the below message.

## 2012-08-23 NOTE — Telephone Encounter (Signed)
Could you let the patient know that she has BV, not and STD, and that I have sent in antibiotics for that.  Thanks!

## 2012-09-05 IMAGING — US US OB FOLLOW-UP
1 series · 18 of 28 positions shown · non-contrast
Comparison: none

OBSTETRICAL ULTRASOUND:
 This ultrasound was performed in The [HOSPITAL], and the AS OB/GYN report will be stored to [REDACTED] PACS.  This report is also available in [HOSPITAL]?s accessANYware.

[Series 1: us ob follow-up · 38 acquisitions, 18 frames shown]
[im 1/38]
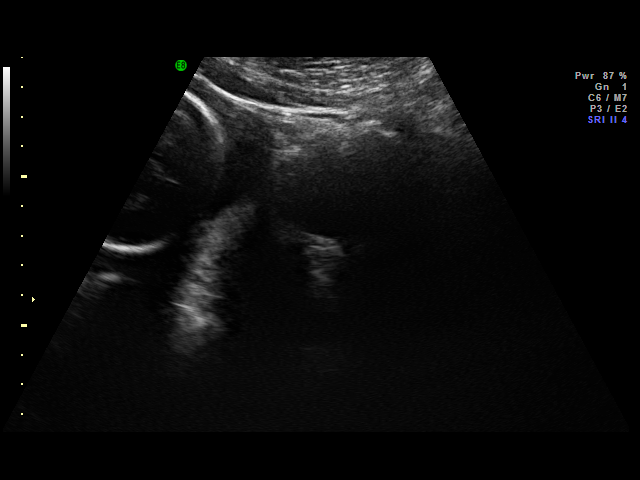
[im 3/38]
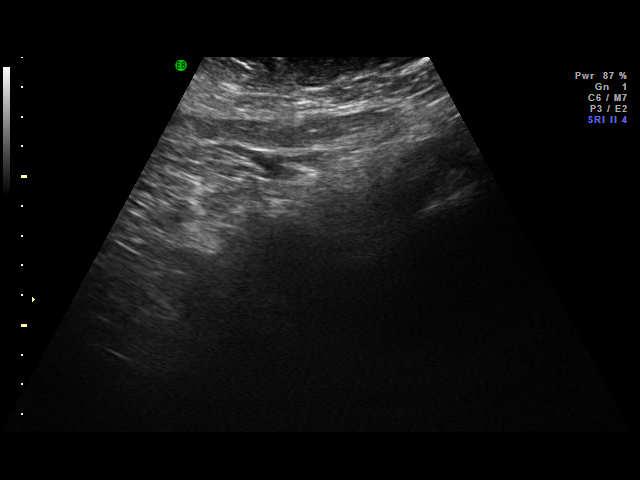
[im 5/38]
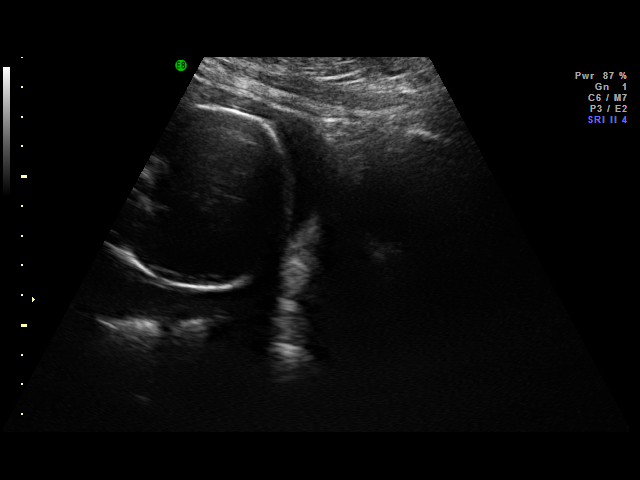
[im 7/38]
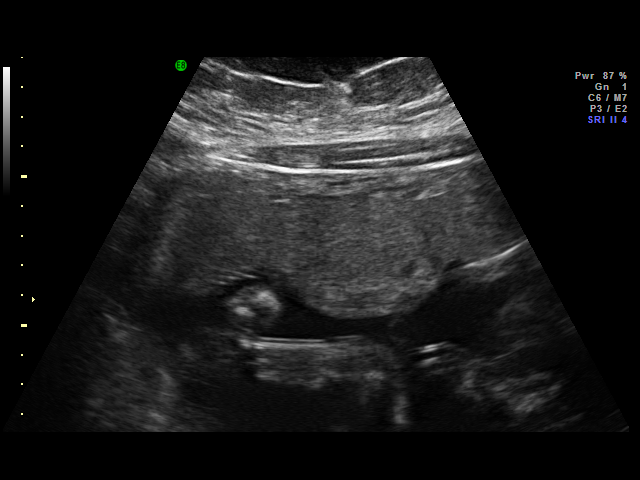
[im 10/38]
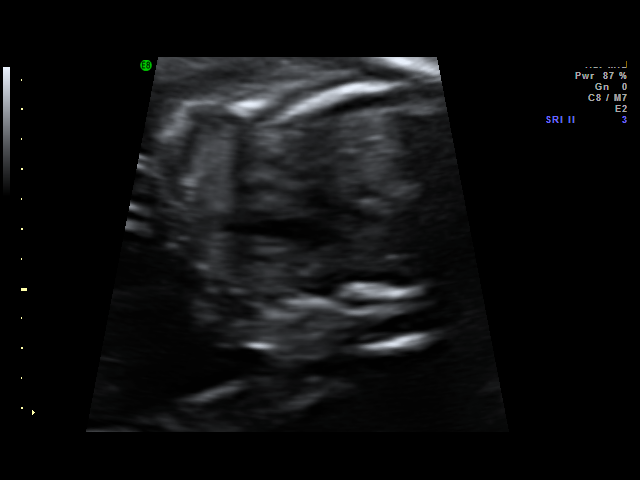
[im 11/38]
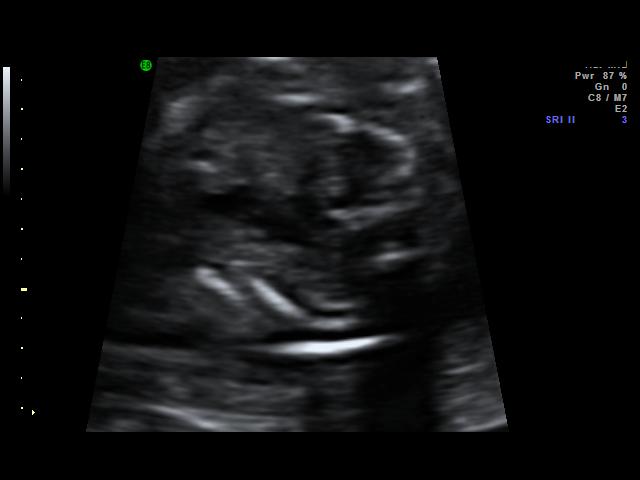
[im 14/38]
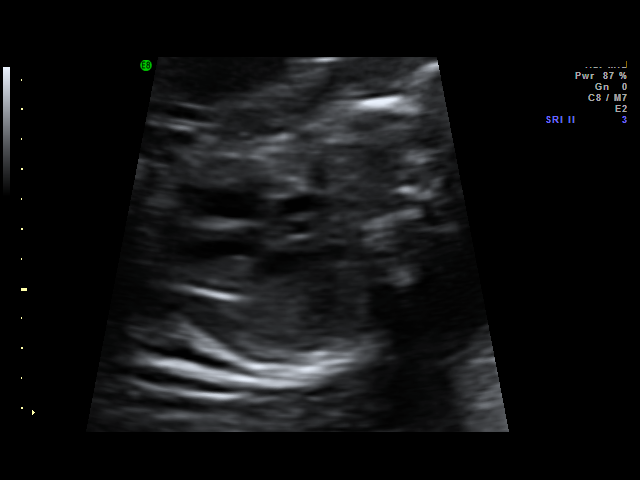
[im 16/38]
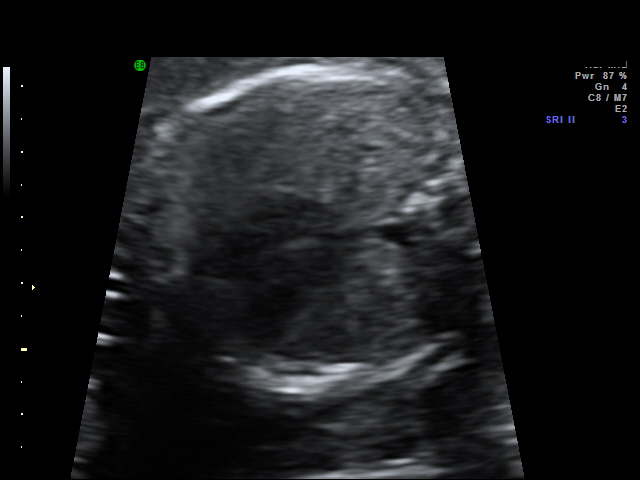
[im 18/38]
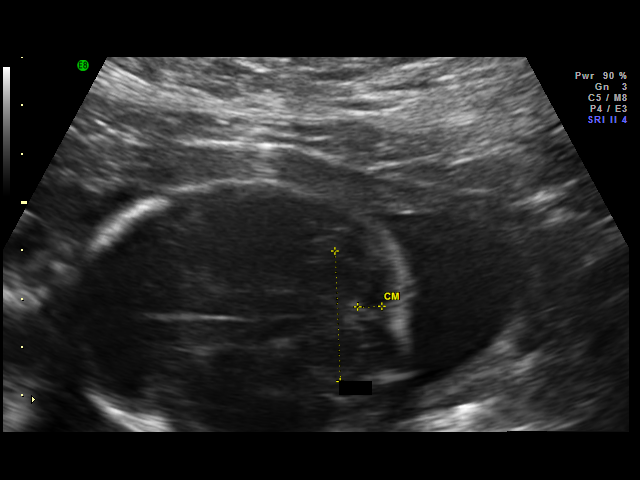
[im 20/38]
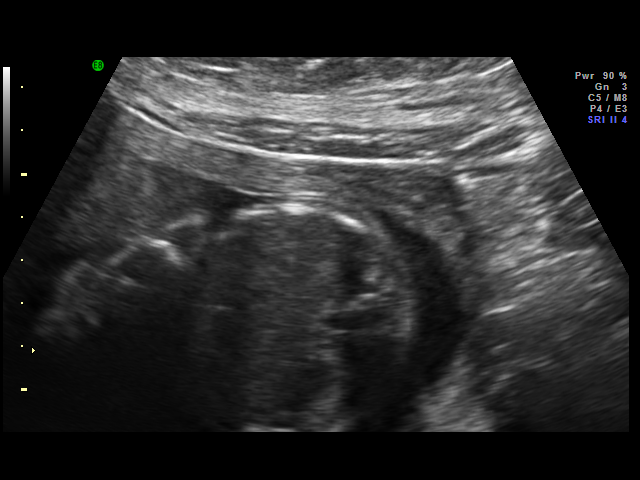
[im 22/38]
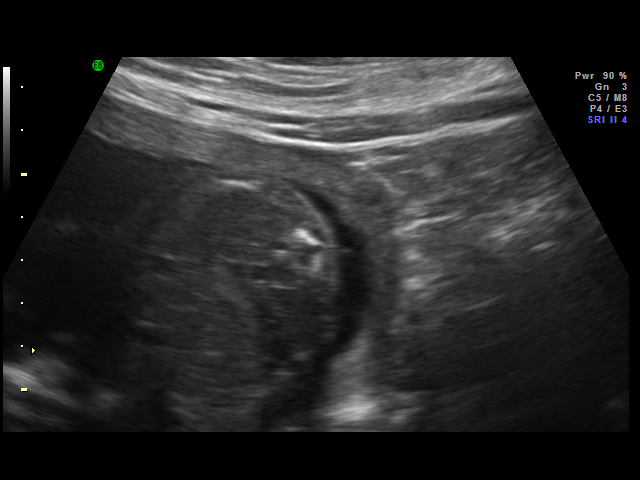
[im 24/38]
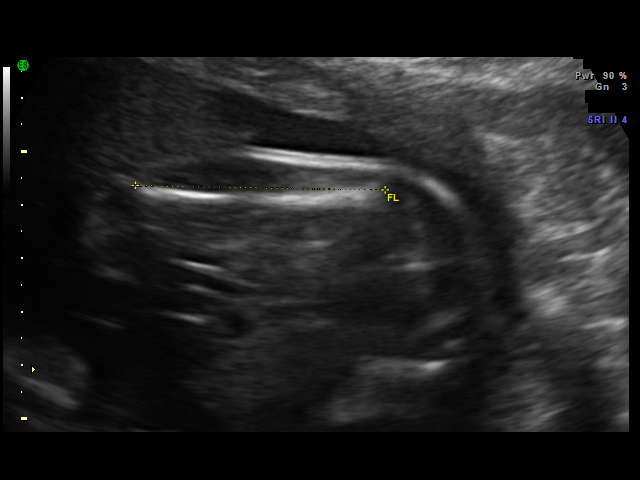
[im 27/38]
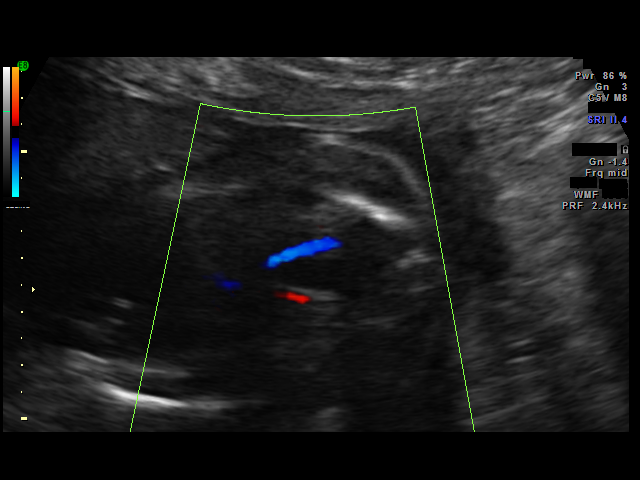
[im 29/38]
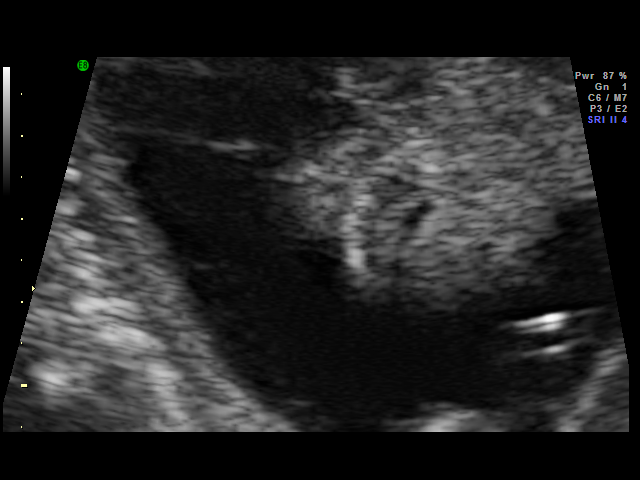
[im 31/38]
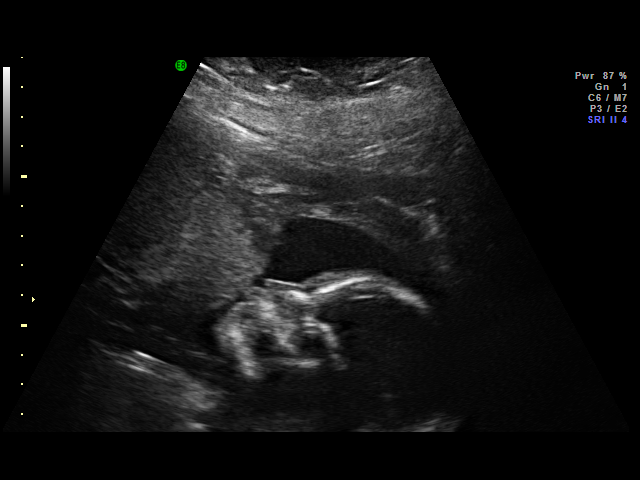
[im 33/38]
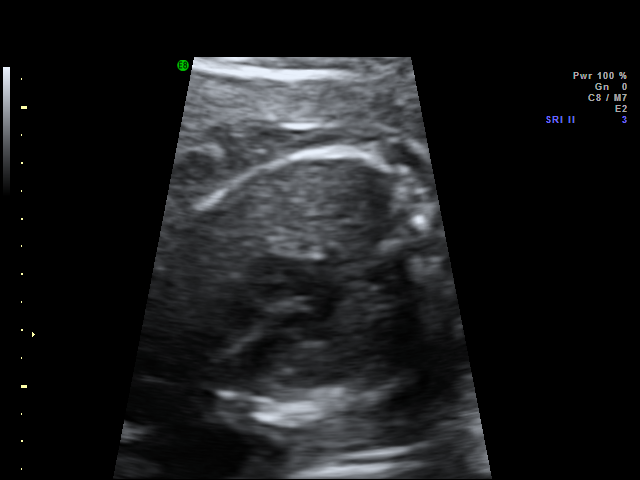
[im 35/38]
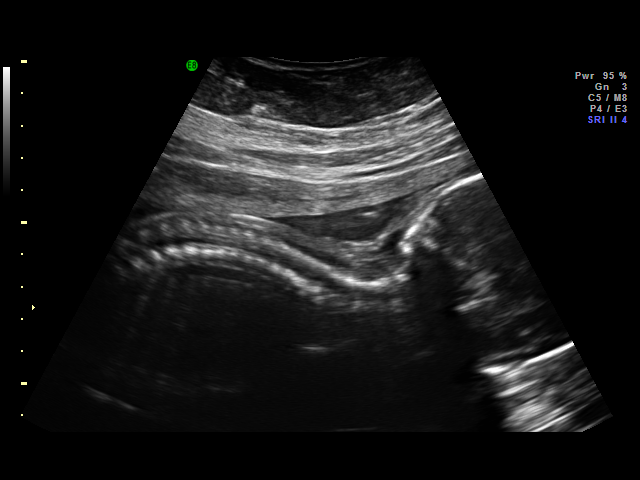
[im 38/38]
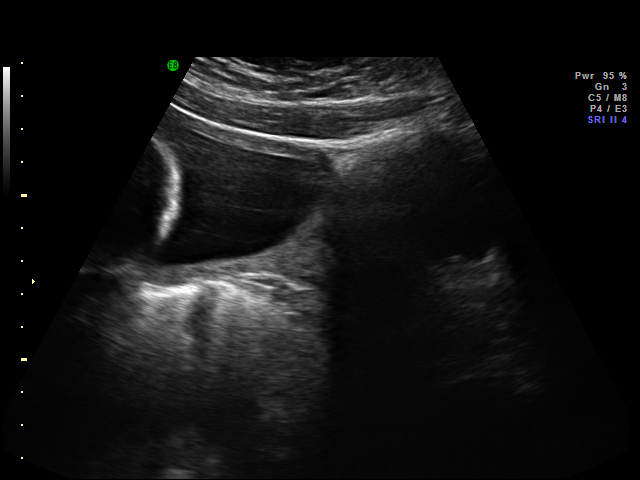

[18 of 28 positions shown; findings below may reference images not displayed]

IMPRESSION: AS OB/GYN has also been faxed to the ordering physician.

## 2012-09-10 ENCOUNTER — Encounter: Payer: Self-pay | Admitting: Family Medicine

## 2012-09-10 ENCOUNTER — Ambulatory Visit (INDEPENDENT_AMBULATORY_CARE_PROVIDER_SITE_OTHER): Payer: Medicare Other | Admitting: Family Medicine

## 2012-09-10 VITALS — BP 126/68 | HR 60 | Temp 98.9°F | Ht 66.0 in | Wt 222.4 lb

## 2012-09-10 DIAGNOSIS — G5600 Carpal tunnel syndrome, unspecified upper limb: Secondary | ICD-10-CM

## 2012-09-10 DIAGNOSIS — Z111 Encounter for screening for respiratory tuberculosis: Secondary | ICD-10-CM

## 2012-09-10 MED ORDER — IBUPROFEN 600 MG PO TABS: 600.0000 mg | ORAL_TABLET | Freq: Four times a day (QID) | ORAL | Status: DC | PRN

## 2012-09-10 MED ORDER — CARPAL TUNNEL WRIST STABILIZER MISC: 1.0000 | Freq: Every day | Status: DC

## 2012-09-10 NOTE — Patient Instructions (Addendum)
It was nice to meet you today, Makayla Vasquez.  For carpal tunnel syndrome, take Motrin 600 mg every 6 hours as needed. Purchase a Carpal Tunnel wrist splint at your pharmacy. Wear it everyday for 2 weeks. If you do not notice any improvement, please schedule follow up appointment with your PCP.  Carpal Tunnel Syndrome You may have carpal tunnel syndrome. This is a common condition. Carpal tunnel syndrome occurs when the tendons, bones, or ligaments in the wrist press against the median nerve as it passes into the hand.  Symptoms can include:  Intermittent numbness.   Pain or a tingling sensation in thumb and first two fingers.  The pain may radiate up to the shoulder. There may even be weakness in the hand muscles. The pain is often worse at night and in the early morning. Nerve conduction tests may be used to prove the diagnosis. Carpal tunnel syndrome is most often due to repeated movements of the hand or wrist. Other causes can include:  Prior injuries.   Diabetes.   Obesity.   Smoking.   Pregnancy. Symptoms that develop during pregnancy often stop when the pregnancy is over.  Treatment includes:  Splinting - A wrist splint helps prevent movements that irritate the nerve. Splints are especially helpful at night when the symptoms are often worse.   Ice packs - Cold packs applied to the palm side of the wrist for 20 minutes every 2 hours while awake may give some relief.   Medication - Medicine to reduce inflammation and pain are often used. Cortisone injections around the nerve may also bring improvement.  Severe cases of carpal tunnel syndrome can require surgery to relieve the pressure on the nerve. This may be necessary if there is evidence of weakness or decreased sensation in your hand, or if your symptoms do not improve with conservative treatment. See your caregiver for follow-up to be certain your condition is improving. Document Released: 11/13/2004 Document Revised: 06/18/2011  Document Reviewed: 08/12/2007 [facility] Patient Information 2012 [place], [place].

## 2012-09-10 NOTE — Progress Notes (Signed)
  Subjective:    Patient ID: Makayla Vasquez, female    DOB: [birth year], 31 y.o.   MRN: [id number]  HPI  31 year old Female presents with worsening wrist discomfort.  Patient was diagnosed with CTS 2 years ago.  She had nerve conduction studies 2 years ago per Orthopedic [facility] and was going to set up surgery, but then patient found out she was pregnant and cancelled surgery.  Symptoms started getting worse 2 weeks ago when weather started to get cold.  RT hand is affected - first 4 fingers.  Complains of elbow and wrist soreness.  Worse at night.  Numbness/tingling is constant and radiates from wrist to elbow.   Patient has not tried any OTC analgesics.  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  Denies any associated fever, chills, nausea/vomiting.     Review of Systems  Per HPI    Objective:   Physical Exam  Constitutional: No distress.  Musculoskeletal:       RT hand/wrist: Full ROM of elbow joint, wrist, and fingers. + Phalen and reverse Phalen maneuver, - Finkelstein, 4/5 strength RT UE compared to LT UE; sensation intact and pulses are strong  Skin: No erythema.          Assessment & Plan:

## 2012-09-10 NOTE — Addendum Note (Signed)
Addended by: Farrell Ours on: 09/10/2012 01:43 PM   Modules accepted: Orders

## 2012-09-10 NOTE — Assessment & Plan Note (Signed)
Will treat conservatively with Motrin 600 mg PRN. Gave Rx for carpal tunnel wrist splint. If no improvement in 2 weeks, return to clinic and consider steroids vs. Injection vs. Surgery. Red flags reviewed with patient.

## 2012-09-13 ENCOUNTER — Ambulatory Visit: Payer: Medicare Other | Admitting: *Deleted

## 2012-09-13 DIAGNOSIS — Z111 Encounter for screening for respiratory tuberculosis: Secondary | ICD-10-CM

## 2012-09-17 ENCOUNTER — Encounter: Payer: Self-pay | Admitting: *Deleted

## 2012-09-20 ENCOUNTER — Encounter: Payer: Self-pay | Admitting: Family Medicine

## 2012-09-20 ENCOUNTER — Ambulatory Visit (INDEPENDENT_AMBULATORY_CARE_PROVIDER_SITE_OTHER): Payer: Medicare Other | Admitting: Family Medicine

## 2012-09-20 VITALS — BP 126/86 | HR 90 | Temp 98.5°F | Ht 66.0 in | Wt 220.0 lb

## 2012-09-20 DIAGNOSIS — R0982 Postnasal drip: Secondary | ICD-10-CM

## 2012-09-20 NOTE — Progress Notes (Signed)
  Subjective:    Patient ID: Makayla Vasquez, female    DOB: 1981-06-23, 31 y.o.   MRN: [id number]  HPI  Left ear and neck pain with sore throat and nose stuffiness Started about 2 weeks ago with drainage now with pain.  Has not tried any medications.  No fever or shortness of breath or edema or bleeding.  Review of Symptoms - see HPI  PMH - Smoking status noted.     Review of Systems     Objective:   Physical Exam  Alert no acute distress Ears:  External ear exam shows no significant lesions or deformities.  Otoscopic examination reveals clear canals, tympanic membranes are intact bilaterally without bulging, retraction, inflammation or discharge. Hearing is grossly normal bilaterall Nose:  Mild erythema with clear discharge  Neck:  No deformities, thyromegaly, masses, or mild right sided trapezius tenderness noted.   Supple with full range of motion Throat - slight redness without deformity or exudae Lungs:  Normal respiratory effort, chest expands symmetrically. Lungs are clear to auscultation, no crackles or wheezes. Heart - Regular rate and rhythm.  No murmurs, gallops or rubs.    Extremities:  No cyanosis, edema, or deformity noted with good range of motion of all major joints.          Assessment & Plan:

## 2012-09-20 NOTE — Assessment & Plan Note (Signed)
Exacerbation associated with sorethroat without signs of infection.  Her neck pain may be related to strain.  No signs of ear infection.  Will treat symptomatically

## 2012-09-20 NOTE — Patient Instructions (Addendum)
Take the ibuprofen 1 every 8 hours for neck and throat pain  Use Nasal Saline 2 sprays in each nostril every 2 hours while awake.  This will help wash out any mucous and irritation  If you are not back to normal in 7-10 days or if you get a high fever or shortness of breath then come back to see Dr Elwyn Reach

## 2012-09-23 ENCOUNTER — Encounter: Payer: Self-pay | Admitting: Internal Medicine

## 2012-09-23 ENCOUNTER — Ambulatory Visit (INDEPENDENT_AMBULATORY_CARE_PROVIDER_SITE_OTHER): Payer: Medicare Other | Admitting: Internal Medicine

## 2012-09-23 VITALS — BP 109/75 | HR 60 | Ht 66.0 in | Wt 224.0 lb

## 2012-09-23 DIAGNOSIS — I5022 Chronic systolic (congestive) heart failure: Secondary | ICD-10-CM

## 2012-09-23 DIAGNOSIS — I499 Cardiac arrhythmia, unspecified: Secondary | ICD-10-CM

## 2012-09-23 DIAGNOSIS — E669 Obesity, unspecified: Secondary | ICD-10-CM

## 2012-09-23 DIAGNOSIS — Z950 Presence of cardiac pacemaker: Secondary | ICD-10-CM

## 2012-09-23 LAB — PACEMAKER DEVICE OBSERVATION
AL THRESHOLD: 0.75 V
ATRIAL PACING PM: 24.79
BAMS-0001: 180 {beats}/min
RV LEAD AMPLITUDE: 4.4 mv
RV LEAD THRESHOLD: 1.5 V

## 2012-09-23 MED ORDER — LOSARTAN POTASSIUM 25 MG PO TABS: 25.0000 mg | ORAL_TABLET | Freq: Every day | ORAL | Status: DC

## 2012-09-23 MED ORDER — CARVEDILOL 3.125 MG PO TABS: 3.1250 mg | ORAL_TABLET | Freq: Two times a day (BID) | ORAL | Status: DC

## 2012-09-23 NOTE — Assessment & Plan Note (Signed)
Her chronic systolic heart failure is class I. She will continue her current medical therapy. She will maintain a low-sodium diet.

## 2012-09-23 NOTE — Assessment & Plan Note (Signed)
Her pacemaker is working normally today. She is a Medtronic biventricular device, with good pacing thresholds in the right atrium, right ventricle, and left ventricle. We'll plan to recheck in several months.

## 2012-09-23 NOTE — Patient Instructions (Signed)
Your physician wants you to follow-up in: 6 months in the device clinic and 12 months with Dr Court Joy will receive a reminder letter in the mail two months in advance. If you don't receive a letter, please call our office to schedule the follow-up appointment.  Restart both the Carvedilol and Losartin

## 2012-09-23 NOTE — Assessment & Plan Note (Signed)
She is been frustrated by her inability to lose weight. We discussed the importance of reducing her fat intake in her diet and increase her physical activity. We'll see her back in a year.

## 2012-09-23 NOTE — Progress Notes (Signed)
HPI Makayla Vasquez returns today for followup. She is a very pleasant 31 year old woman with a history of congenital complete heart block, status post pacemaker insertion. She developed worsening left ventricular dysfunction and at the time of her most recent generator change, underwent upgrade to a biventricular pacemaker. In the interim she has done well. Her only complaint is frustration over her inability to lose weight. No syncope, no palpitations, and no chest pain. Currently, her heart failure symptoms are class I. Allergies  Allergen Reactions  . Sertraline Hcl     REACTION: hives     Current Outpatient Prescriptions  Medication Sig Dispense Refill  . ibuprofen (ADVIL,MOTRIN) 600 MG tablet Take 1 tablet (600 mg total) by mouth every 6 (six) hours as needed for pain.  60 tablet  0  . carvedilol (COREG) 3.125 MG tablet Take 3.125 mg by mouth 2 (two) times daily.      [name] losartan (COZAAR) 25 MG tablet Take 1 tablet (25 mg total) by mouth daily.  30 tablet  11  . [DISCONTINUED] cetirizine (ZYRTEC) 10 MG tablet Take 1 tablet (10 mg total) by mouth daily.  30 tablet  11  . [DISCONTINUED] fluticasone (FLONASE) 50 MCG/ACT nasal spray Place 2 sprays into the nose daily.  16 g  6     Past Medical History  Diagnosis Date  . Heart palpitations   . Cardiac pacemaker     Since age 53. Last generator change in 2004  . Congenital complete AV block   . Obesity   . GERD (gastroesophageal reflux disease)   . Asymptomatic LV dysfunction     Echo in Dec 2011 with EF 35 to 40%. Felt to be due to paced rhythm  . Seizures     as a child- from high fever  . Anxiety   . Hypertension   . Bipolar affective disorder   . Depression     bipolar  . Pacemaker     ROS:   All systems reviewed and negative except as noted in the HPI.   Past Surgical History  Procedure Date  . Throat surgery 1994    s/p laser treatment  . Cesarean section   . Insert / replace / remove pacemaker     2001  .  Cholecystectomy   . Iud removal 11/03/2011    Procedure: INTRAUTERINE DEVICE (IUD) REMOVAL;  Surgeon: Makayla C. Marice Potter, MD;  Location: [facility];  Service: Gynecology;  Laterality: N/A;     Family History  Problem Relation Age of Onset  . Heart disease Mother   . Hypertension Mother   . Heart disease Father   . Heart disease Sister     cardiomegaly and atherosclerosis     History   Social History  . Marital Status: Single    Spouse Name: N/A    Number of Children: 1  . Years of Education: N/A   Occupational History  . CNA    Social History Main Topics  . Smoking status: Former Smoker    Types: Cigarettes  . Smokeless tobacco: Not on file  . Alcohol Use: No  . Drug Use: No  . Sexually Active: Yes    Birth Control/ Protection: IUD, Implant   Other Topics Concern  . Not on file   Social History Narrative  . No narrative on file     BP 109/75  Pulse 60  Ht 5\' 6"  (1.676 m)  Wt 224 lb (101.606 kg)  BMI 36.15 kg/m2  Physical Exam:  Well appearing obese, 31 year old woman, NAD HEENT: Unremarkable Neck:  No JVD, no thyromegally Lungs:  Clear with no wheezes, rales, or rhonchi. Well-healed pacemaker incision. HEART:  Regular rate rhythm, no murmurs, no rubs, no clicks Abd:  soft, positive bowel sounds, no organomegally, no rebound, no guarding Ext:  2 plus pulses, no edema, no cyanosis, no clubbing Skin:  No rashes no nodules Neuro:  CN II through XII intact, motor grossly intact  DEVICE  Normal device function.  See PaceArt for details.   Assess/Plan:

## 2012-09-24 ENCOUNTER — Telehealth: Payer: Self-pay | Admitting: Emergency Medicine

## 2012-09-24 NOTE — Telephone Encounter (Signed)
Patient is calling asking for a referral to an eye doctor, Dr. Mitzi Davenport is who she uses.

## 2012-09-24 NOTE — Telephone Encounter (Signed)
Pt has upcoming appt with Dr.Booth on Monday and can discuss then. Makayla Vasquez, Makayla Vasquez

## 2012-09-27 ENCOUNTER — Encounter: Payer: Self-pay | Admitting: Emergency Medicine

## 2012-09-27 ENCOUNTER — Ambulatory Visit (INDEPENDENT_AMBULATORY_CARE_PROVIDER_SITE_OTHER): Payer: Medicare Other | Admitting: Emergency Medicine

## 2012-09-27 VITALS — BP 119/74 | HR 74 | Temp 97.8°F | Ht 66.0 in | Wt 222.0 lb

## 2012-09-27 DIAGNOSIS — G5600 Carpal tunnel syndrome, unspecified upper limb: Secondary | ICD-10-CM

## 2012-09-27 DIAGNOSIS — Z23 Encounter for immunization: Secondary | ICD-10-CM

## 2012-09-27 NOTE — Progress Notes (Signed)
  Subjective:    Patient ID: Makayla Vasquez, female    DOB: 02-Nov-1980, 31 y.o.   MRN: [id number]  HPI Makayla Vasquez is here for f/u of carpal tunnel.  She states it is present in both hands, but worse in the right.  Works as a Lawyer.  Started on conservative therapy with ibuprofen and bracing 2.5 weeks ago.  This has worked very well.  She reports occasional numbness in her right hand after work (maybe twice a week), but this resolves with the brace.  Using ibuprofen 1-2 times per day, but not everyday.  Denies any weakness.  I have reviewed and updated the following as appropriate: allergies and current medications SHx: former smoker  Review of Systems See HPI    Objective:   Physical Exam BP 119/74  Pulse 74  Temp 97.8 F (36.6 C) (Oral)  Ht 5\' 6"  (1.676 m)  Wt 222 lb (100.699 kg)  BMI 35.83 kg/m2 Gen: alert, cooperative, NAD Right hand: no erytehma or edema, no thenar atrophy, 5/5 strength in grip and thumb opposition, sensation intact to light touch, Tinel's negative, Phalen's positive after 30 secs, resolved quickly.     Assessment & Plan:

## 2012-09-27 NOTE — Assessment & Plan Note (Signed)
Improved with conservative management.  Continue with nightly bracing and ibuprofen prn.  Discussed returning to clinic if worsening or she develops weakness.

## 2012-09-27 NOTE — Patient Instructions (Addendum)
Nice to meet you! If you have any questions or your carpal tunnel is getting worse, please return to clinic.

## 2012-10-03 IMAGING — US US OB FOLLOW-UP
1 series · 15 of 28 positions shown · non-contrast
Comparison: none

[Series 1: us ob follow-up · 35 acquisitions, 15 frames shown]
[im 1/35]
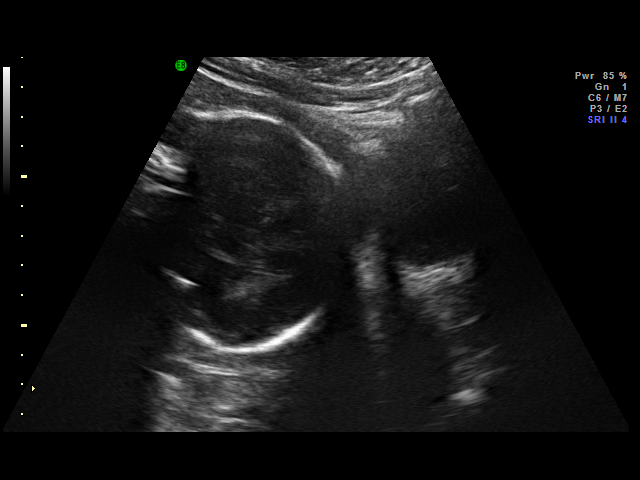
[im 3/35]
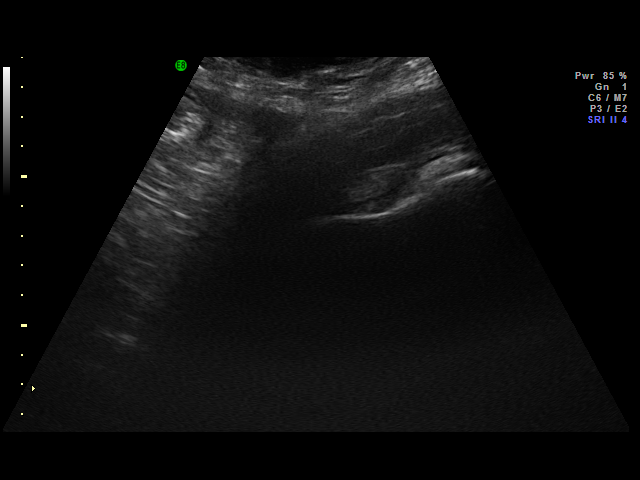
[im 6/35]
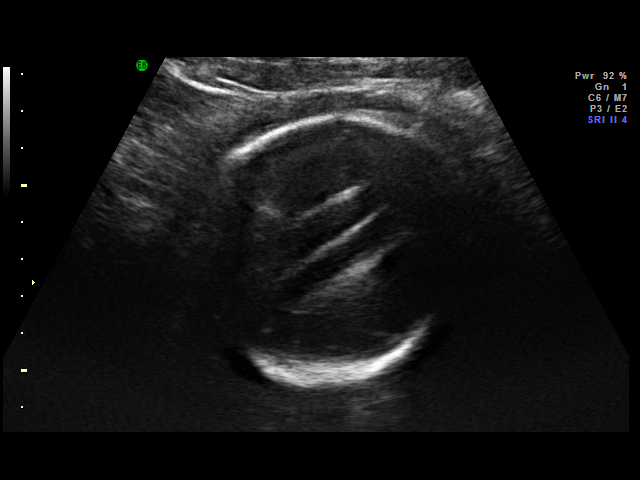
[im 8/35]
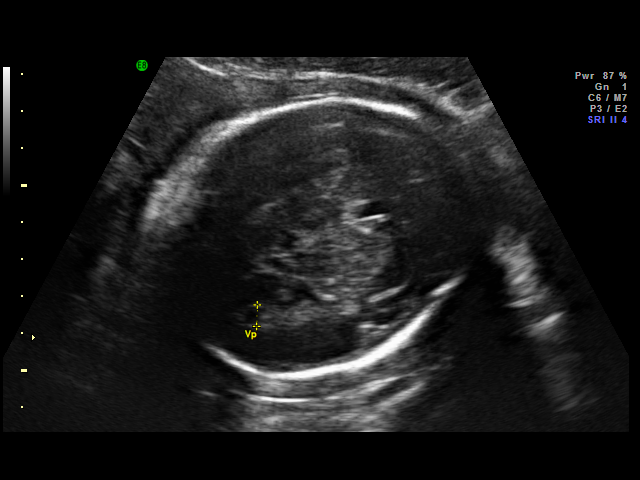
[im 11/35]
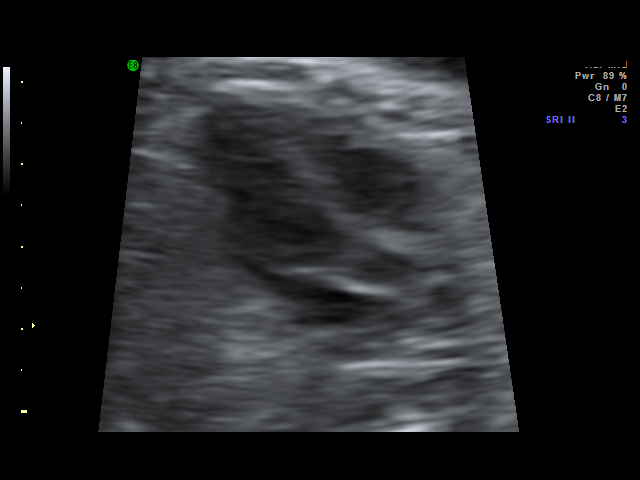
[im 13/35]
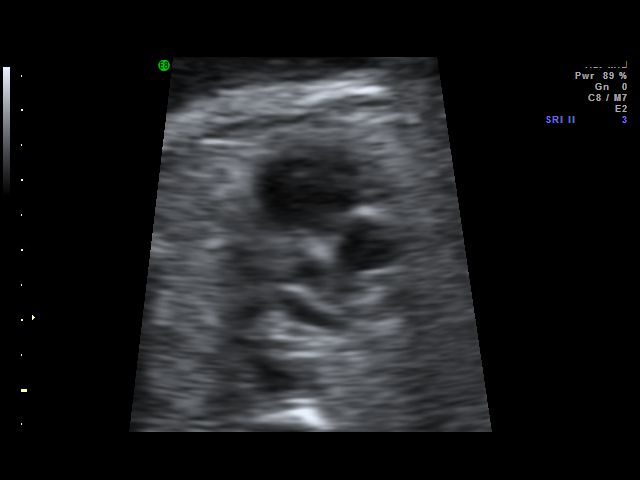
[im 16/35]
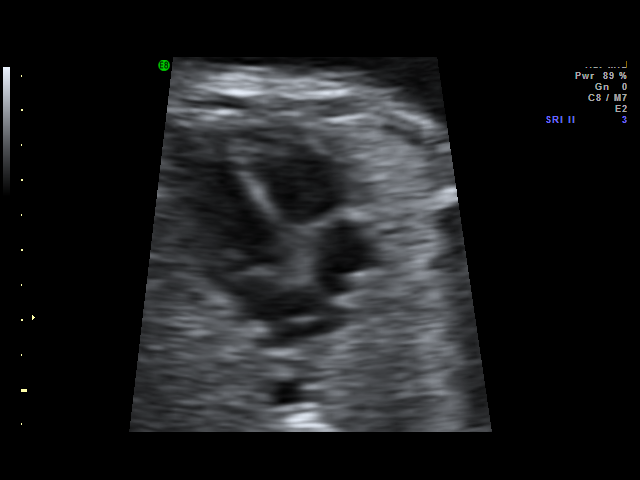
[im 18/35]
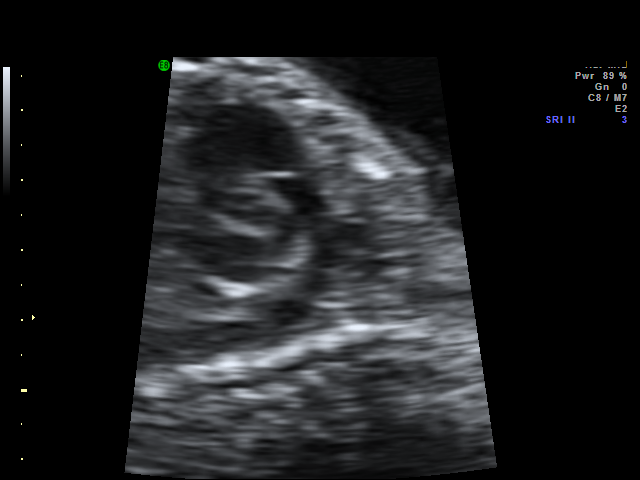
[im 19/35]
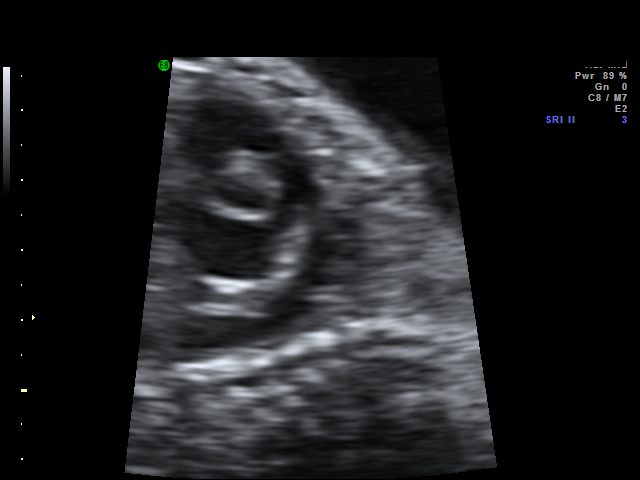
[im 22/35]
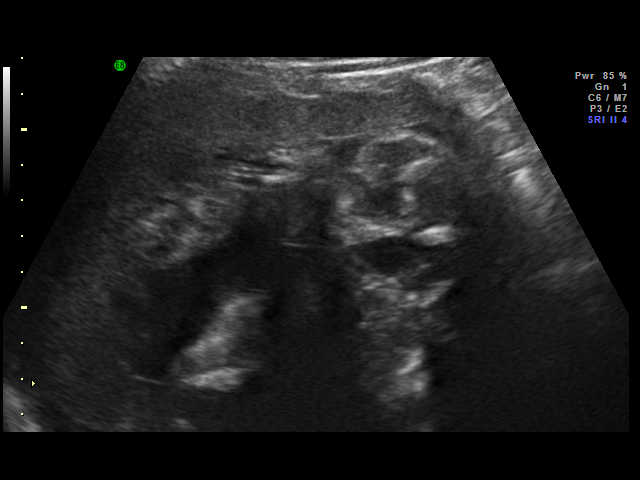
[im 24/35]
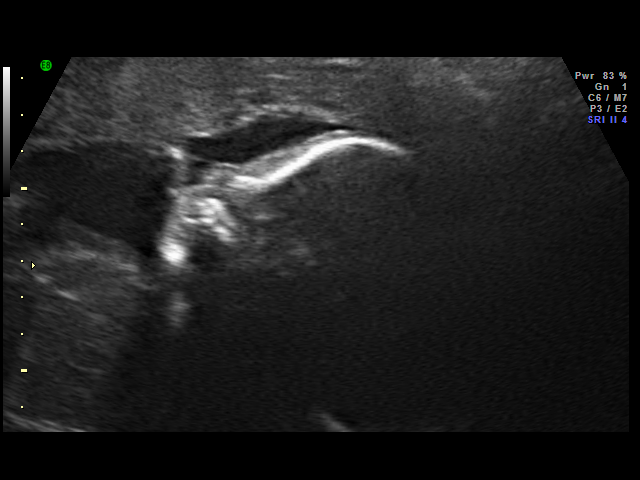
[im 27/35]
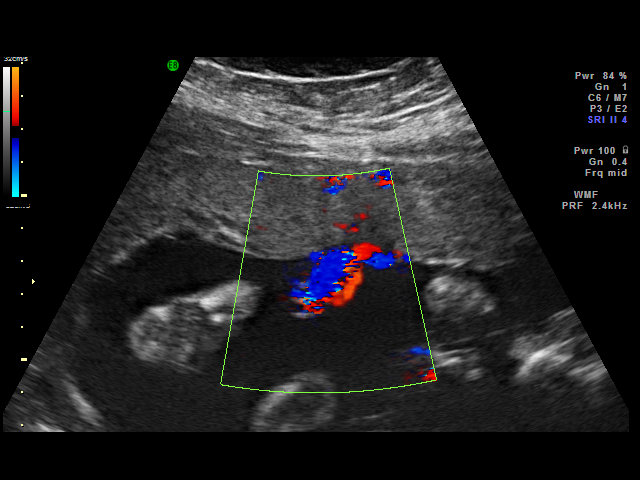
[im 29/35]
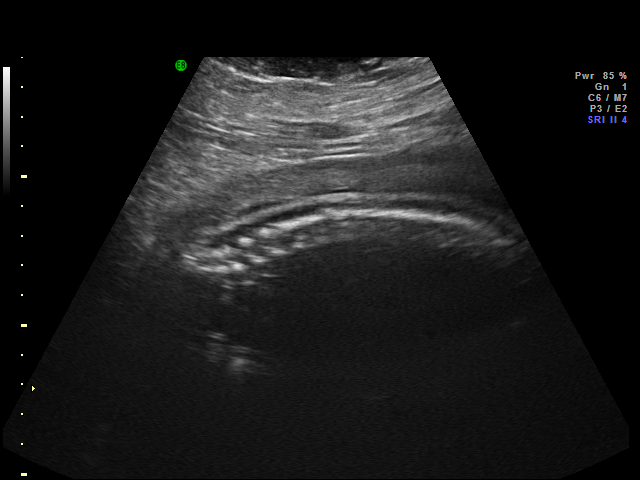
[im 32/35]
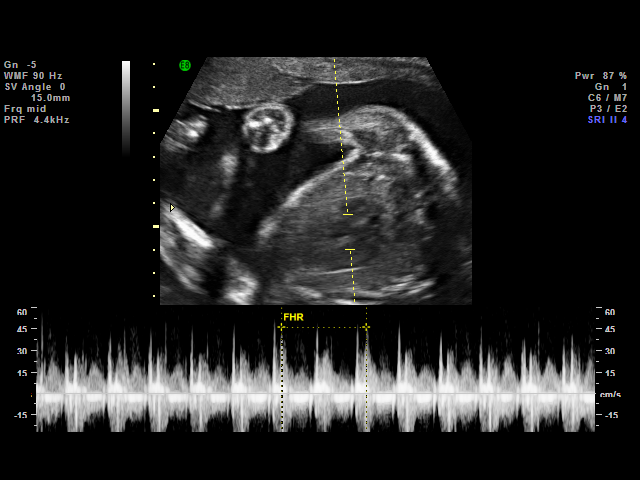
[im 35/35]
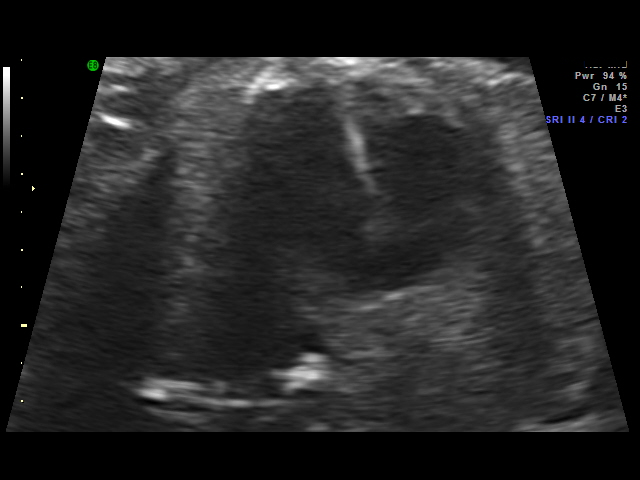

[15 of 28 positions shown; findings below may reference images not displayed]

OBSTETRICS REPORT
                      (Signed Final 09/10/2010 [DATE])

 Order#:         _O
Procedures

 US OB FOLLOW UP                                       76816.1
Indications

 History of maternal congenital heart block
 (pacemaker in place, following with cardiology)
 Bipolor disorder (no current meds)
 Asthma
 Rheumatoid arthritis (followed by rheumatology)
 Obesity
 Assess Fetal Growth / Estimated Fetal Weight
 Follow-up fetal anatomic evaluation
Fetal Evaluation

 Fetal Heart Rate:  138                          bpm
 Cardiac Activity:  Observed
 Presentation:      Cephalic
 Placenta:          Anterior, above cervical os
 P. Cord            Visualized
 Insertion:

 Amniotic Fluid
 AFI FV:      Subjectively within normal limits
 AFI Sum:     16.21   cm       59  %Tile     Larg Pckt:    6.29  cm
 RUQ:   3.87    cm   RLQ:    2.49   cm    LUQ:   3.56    cm   LLQ:    6.29   cm
Biometry

 BPD:     72.3  mm     G. Age:  29w 0d                CI:         80.5   70 - 86
 OFD:     89.8  mm                                    FL/HC:      20.4   19.6 -

 HC:     261.7  mm     G. Age:  28w 3d       18  %    HC/AC:      1.10   0.99 -

 AC:     238.4  mm     G. Age:  28w 1d       30  %    FL/BPD:     73.7   71 - 87
 FL:      53.3  mm     G. Age:  28w 2d       27  %    FL/AC:      22.4   20 - 24
 HUM:     49.3  mm     G. Age:  29w 0d       52  %

 Est. FW:    8989  gm    2 lb 10 oz      45  %
Gestational Age
 U/S Today:     28w 3d                                        EDD:   11/30/10
 Best:          28w 4d     Det. By:  U/S C R L (04/04/10)     EDD:   11/29/10
Anatomy

 Cranium:           Appears normal      Aortic Arch:       Appears normal
 Fetal Cavum:       Appears normal      Ductal Arch:       Appears normal
 Ventricles:        Appears normal      Diaphragm:         Previously seen
 Choroid Plexus:    Appears normal      Stomach:           Appears
                                                           normal, left
                                                           sided
 Cerebellum:        Appears normal      Abdomen:           Appears normal
 Posterior Fossa:   Appears normal      Abdominal Wall:    Appears nml
                                                           (cord insert,
                                                           abd wall)
 Nuchal Fold:       Previously seen     Cord Vessels:      Previously seen
 Face:              Lips appear         Kidneys:           Appear normal
                    normal;
                    orbits/profile
                    prev seen
 Heart:             Appears normal      Bladder:           Appears normal
                    (4 chamber &
                    axis)
 RVOT:              Appears normal      Spine:             Previously seen
 LVOT:              Appears normal      Limbs:             Previously seen

 Other:     Fetus appears to be a male. Heels & 5th previously
            visualized. Technically difficult due to maternal habitus
            and fetal position.
Cervix Uterus Adnexa

 Cervical Length:    3.9      cm

 Cervix:       Normal appearance by transabdominal scan.
 Uterus:       No abnormality visualized.

 Left Ovary:    Not visualized.
 Right Ovary:   Not visualized.
 Adnexa:     No adnexal mass visualized.
Impression

 IUP at  28+4 weeks
 Normal interval anatomy; all heart view seen today and
 appeared normal
 Normal amniotic fluid volume
 Appropriate interval growth with EFW at the 45th %tile
Recommendations

 Follow-up ultrasound for growth in 6 weeks

 questions or concerns.

## 2012-11-04 ENCOUNTER — Ambulatory Visit (INDEPENDENT_AMBULATORY_CARE_PROVIDER_SITE_OTHER): Payer: Medicare Other | Admitting: Family Medicine

## 2012-11-04 ENCOUNTER — Encounter: Payer: Self-pay | Admitting: Family Medicine

## 2012-11-04 VITALS — BP 101/69 | HR 81 | Temp 98.0°F | Ht 66.0 in | Wt 227.0 lb

## 2012-11-04 DIAGNOSIS — Q892 Congenital malformations of other endocrine glands: Secondary | ICD-10-CM

## 2012-11-04 MED ORDER — AMOXICILLIN-POT CLAVULANATE 875-125 MG PO TABS: 1.0000 | ORAL_TABLET | Freq: Two times a day (BID) | ORAL | Status: DC

## 2012-11-04 MED ORDER — SULFAMETHOXAZOLE-TMP DS 800-160 MG PO TABS: 1.0000 | ORAL_TABLET | Freq: Two times a day (BID) | ORAL | Status: DC

## 2012-11-04 NOTE — Assessment & Plan Note (Signed)
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 Will refer back to ENT for further evaluation. Unable to express enough drainage to culture today. Will start on Augmentin for 10 days (Rx for Bactrim sent to cover possible MRSA, but changed to Augmentin to cover oral flora.) Patient given red flags that should prompt her return for evaluation.

## 2012-11-04 NOTE — Progress Notes (Signed)
Patient ID: Makayla Vasquez, female   DOB: September 01, 1981, 32 y.o.   MRN: [id number] [facility] Vinie Charity M. Carlisia Geno, MD Phone: [phone]   Subjective: HPI: Patient is a 32 y.o. female presenting to clinic today for same day appointment for drainage from neck. She had similar symptoms in the past in same area. States it usually drains when she has sinus congestion. Typically seen by ENT for this, and has had a procedure in the past in 1994. She states she called ENT but could not be seen without a new referral.  Location: Under chin     Onset: 10 days ago   Course: Started with soreness, then "busted" 5 days ago. Started draining again yesterday. Yellow-green, no odor. No blood. Self-treated with: Cleaning skin             Improvement with treatment: No  History Pruritis: yes  Tenderness: yes  New medications/antibiotics: no  Tick/insect/pet exposure: no  Recent travel: no  New detergent, new clothing, or other topical exposure: no  Anyone else with similar symptoms: None  Red Flags Feeling ill: no  Fever: no  Mouth lesions: no  Facial/tongue swelling/difficulty breathing:  no  Diabetic or immunocompromised: no    History Reviewed: Non smoker. Health Maintenance: UTD  ROS: Please see HPI above.  Objective: Office vital signs reviewed.  Physical Examination:  General: Awake, alert. NAD. HEENT: Atraumatic, normocephalic.  Neck: Full ROM, no neck stiffness. No LAD appreciated. Old scarred area midline in fold of neck with clear drainage appreciated. 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

## 2012-11-04 NOTE — Patient Instructions (Signed)
It was nice to meet you today.   I am sorry you aren't feeling well. I have sent in an antibiotic for you to take twice daily. I have also sent in a referral to ENT.  If you develop fevers, odor from the drainage, overall feeling ill or difficulty breathing, please seek medical care.  Take care! Daray Polgar M. Elissia Spiewak, M.D.

## 2012-11-06 IMAGING — US US FETAL BPP W/O NONSTRESS
1 series · 14 of 20 positions shown · non-contrast
Comparison: none

[Series 1: us fetal bpp w/o nonstress · non-contrast · 20 acquisitions, 14 frames shown]
[im 1/20]
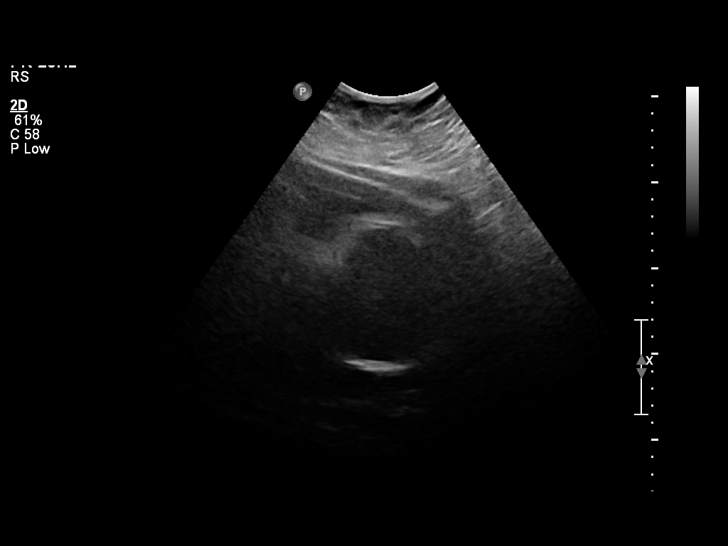
[im 3/20]
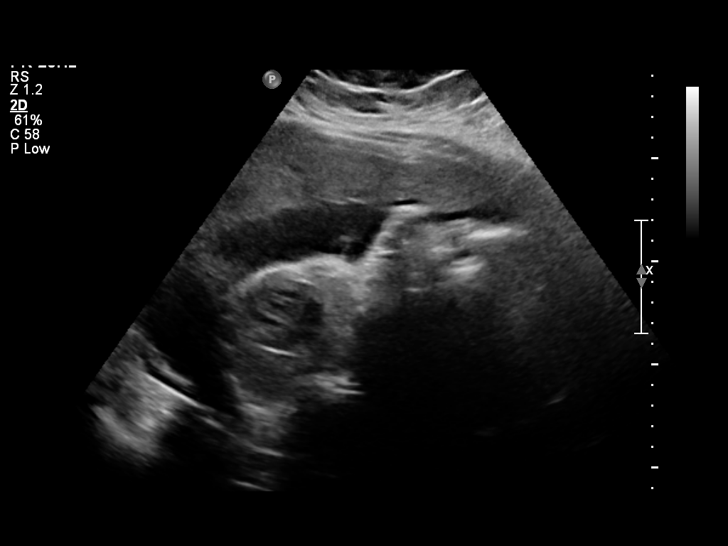
[im 4/20]
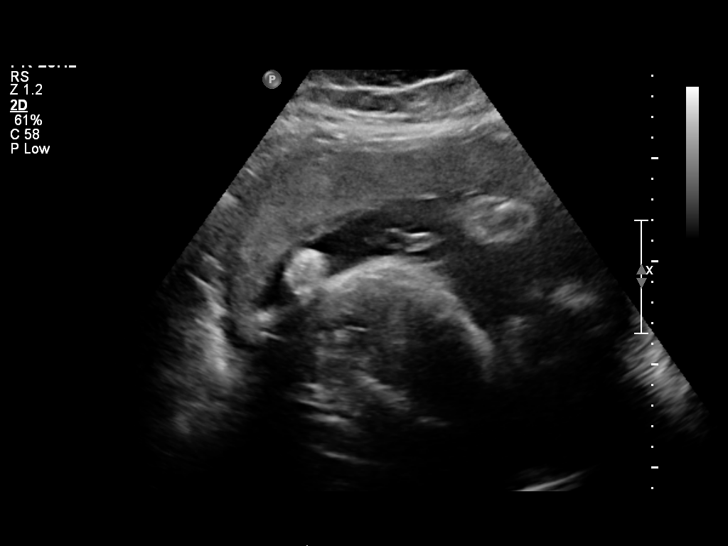
[im 6/20]
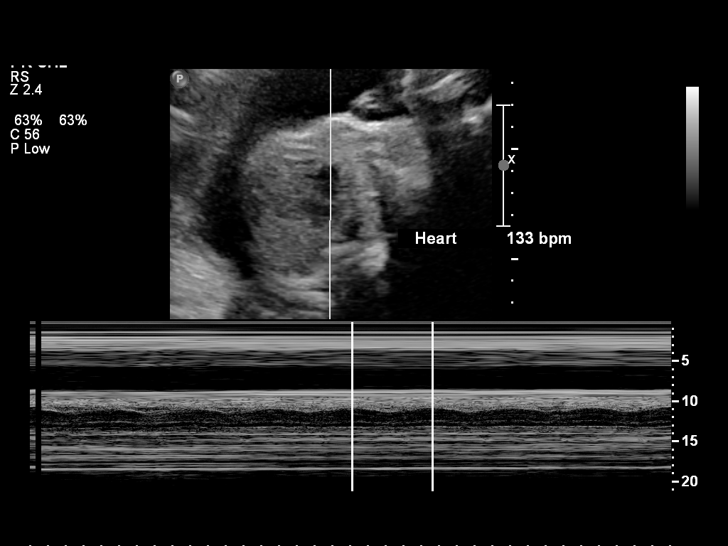
[im 7/20]
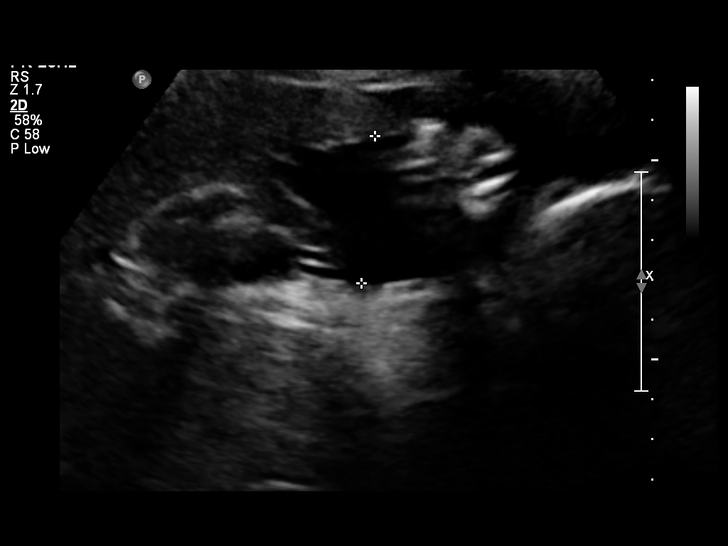
[im 8/20]
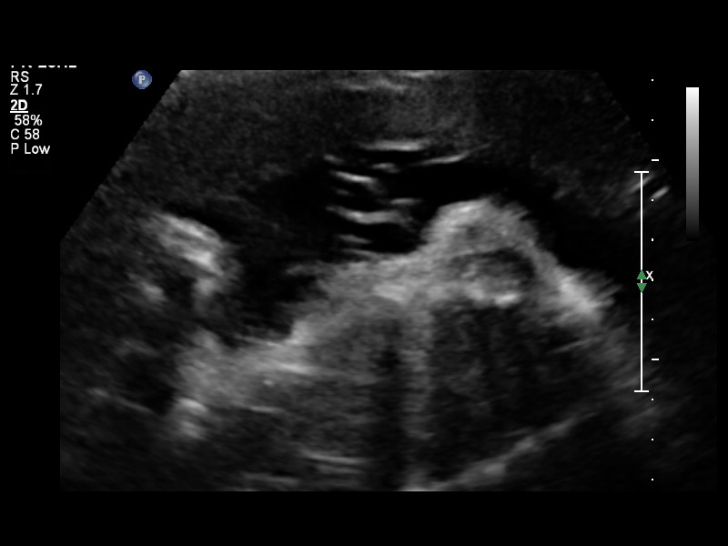
[im 10/20]
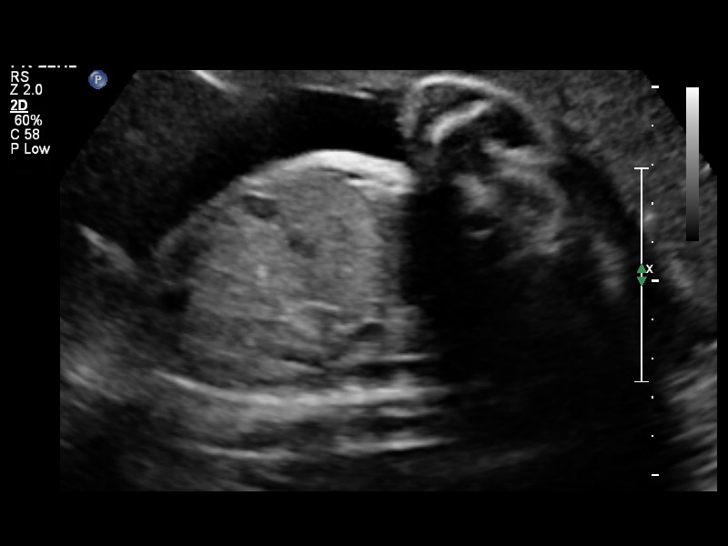
[im 11/20]
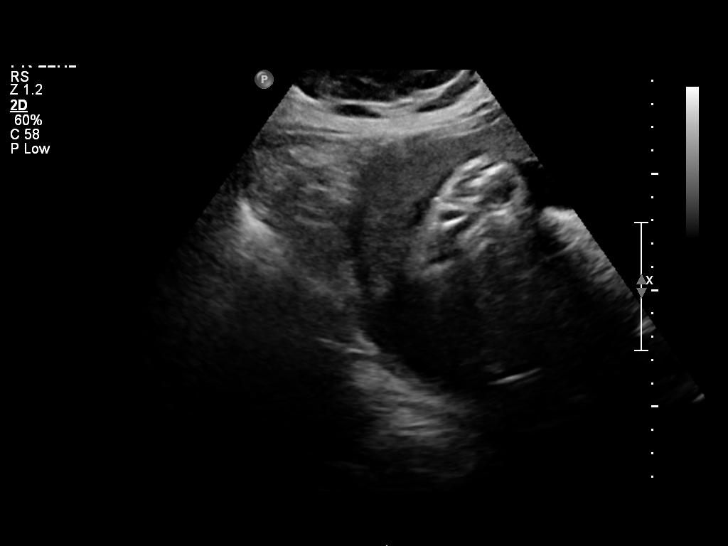
[im 13/20]
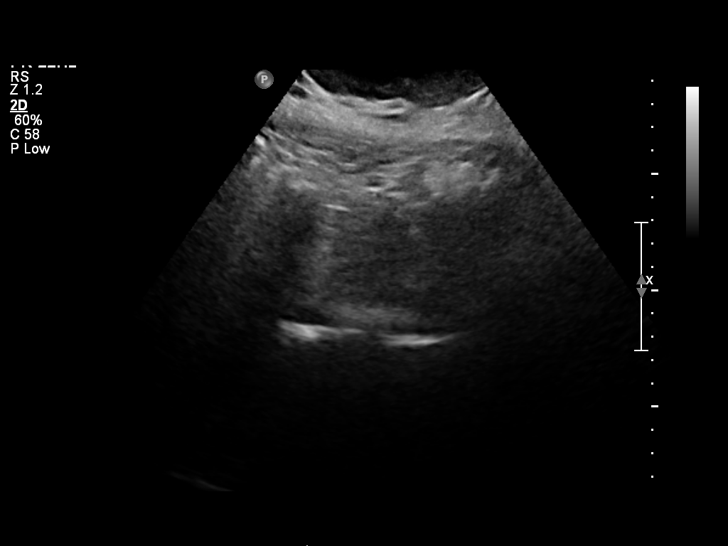
[im 14/20]
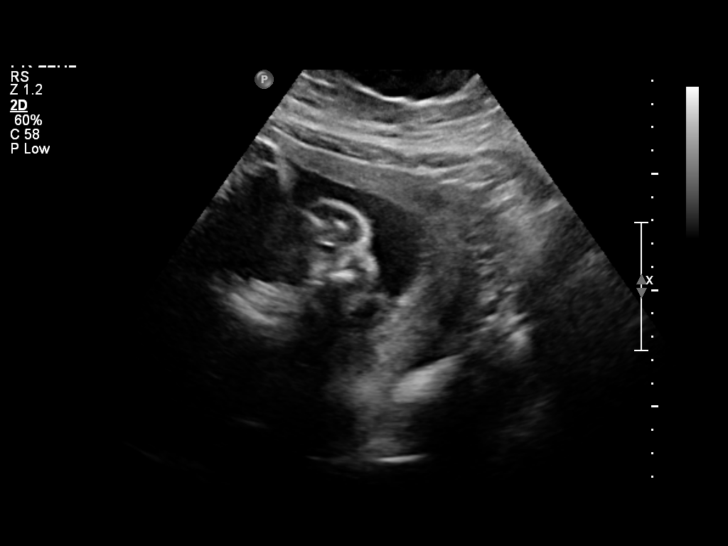
[im 16/20]
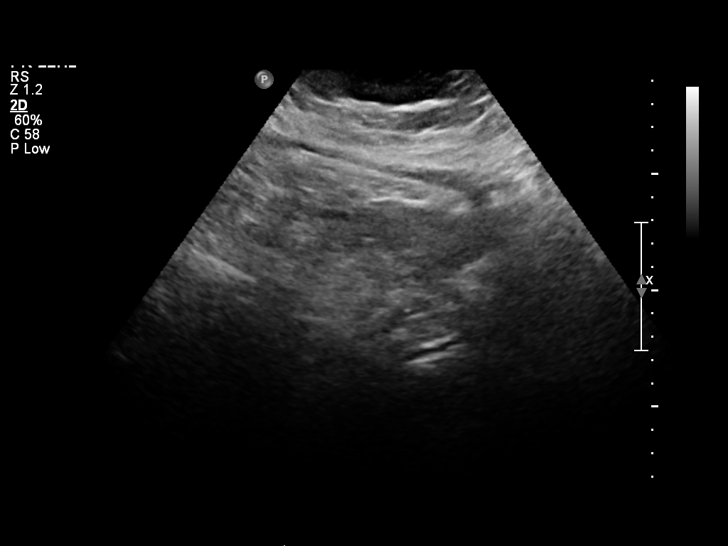
[im 17/20]
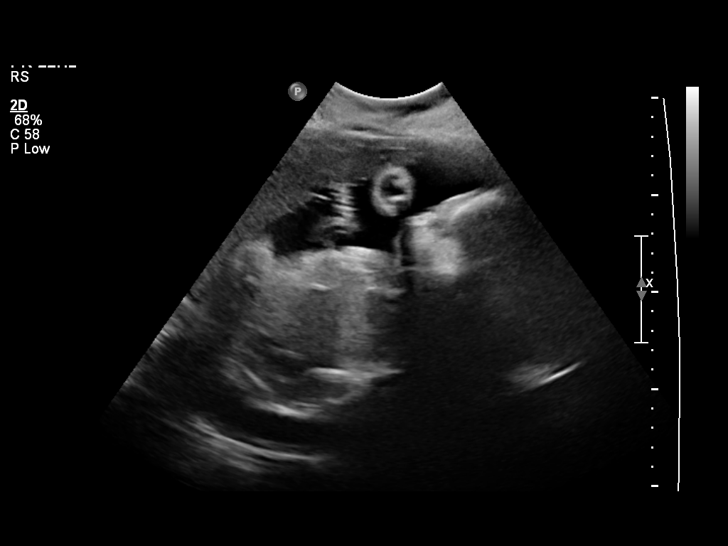
[im 18/20]
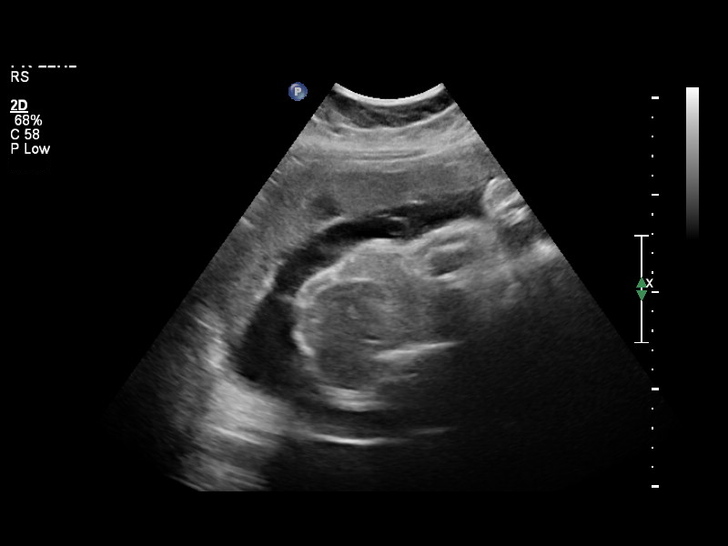
[im 20/20]
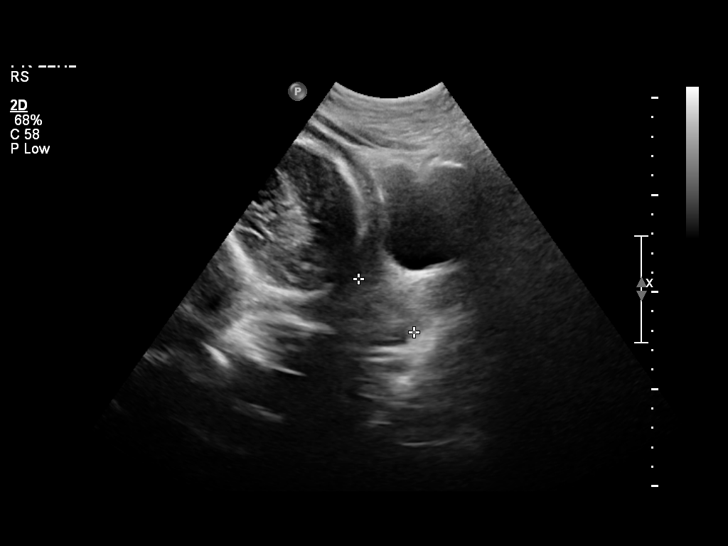

[14 of 20 positions shown; findings below may reference images not displayed]

OBSTETRICS REPORT
                      (Signed Final 10/14/2010 [DATE])

                 MAU/Triage
Procedures

 US FETAL BPP W/O NONSTRESS                            76819.0
Indications

 Fetal cardiac decelerations on NST
Fetal Evaluation

 Fetal Heart Rate:  133                          bpm
 Cardiac Activity:  Observed
 Presentation:      Cephalic
 Placenta:          Anterior, above cervical os
 P. Cord            Previously Visualized
 Insertion:

 Amniotic Fluid
 AFI FV:      Subjectively within normal limits
                                             Larg Pckt:     3.7  cm
Biophysical Evaluation

 Amniotic F.V:   Pocket => 2 cm two         F. Tone:        Observed
                 planes
 F. Movement:    Observed                   Score:          [DATE]
 F. Breathing:   Observed
Gestational Age

 Best:          33w 3d     Det. By:  U/S C R L (04/04/10)     EDD:   11/29/10
Cervix Uterus Adnexa

 Cervical Length:    3.95     cm

 Cervix:       Normal appearance by transabdominal scan.
Impression

 Biophysical profile score of [DATE].
 questions or concerns.

## 2012-11-10 ENCOUNTER — Telehealth: Payer: Self-pay | Admitting: Internal Medicine

## 2012-11-10 NOTE — Telephone Encounter (Signed)
lmtcb

## 2012-11-10 NOTE — Telephone Encounter (Signed)
Pt hasn't taken meds in a awhile , wants to know if she should start taking them back at the same time?

## 2012-11-11 ENCOUNTER — Encounter: Payer: Self-pay | Admitting: Internal Medicine

## 2012-11-11 IMAGING — US US OB LIMITED
1 series · 13 of 17 positions shown · non-contrast
Comparison: none

[Series 1: us fetal bpp w/o nonstress · non-contrast · 17 acquisitions, 13 frames shown]
[im 1/17]
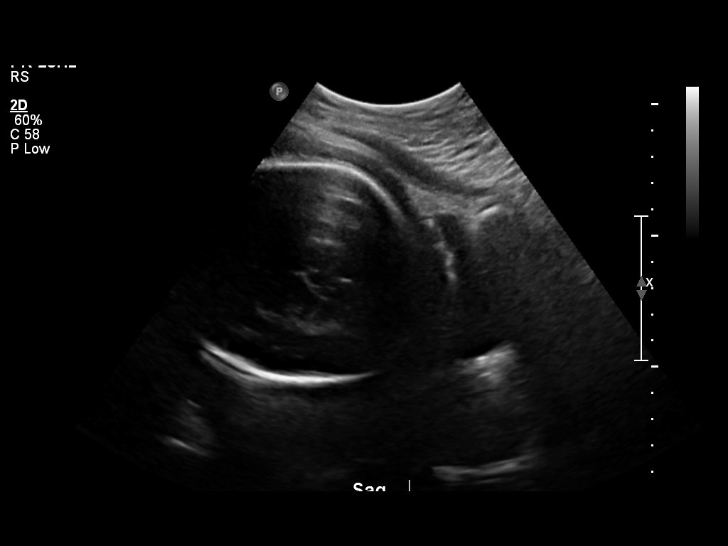
[im 2/17]
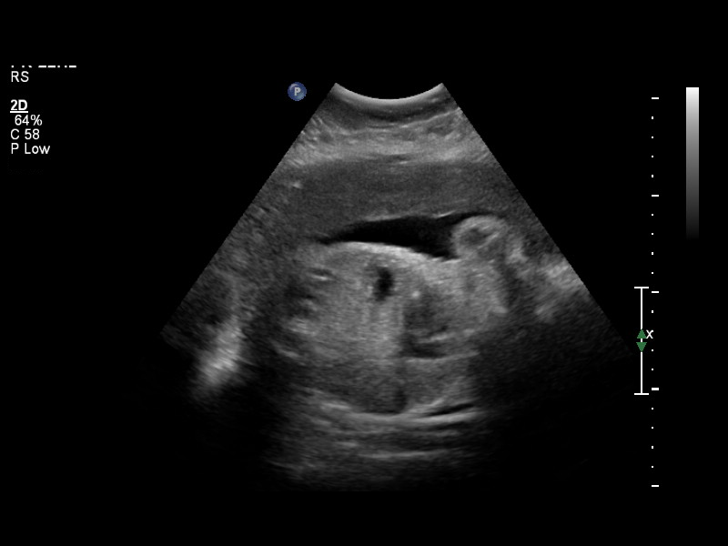
[im 4/17]
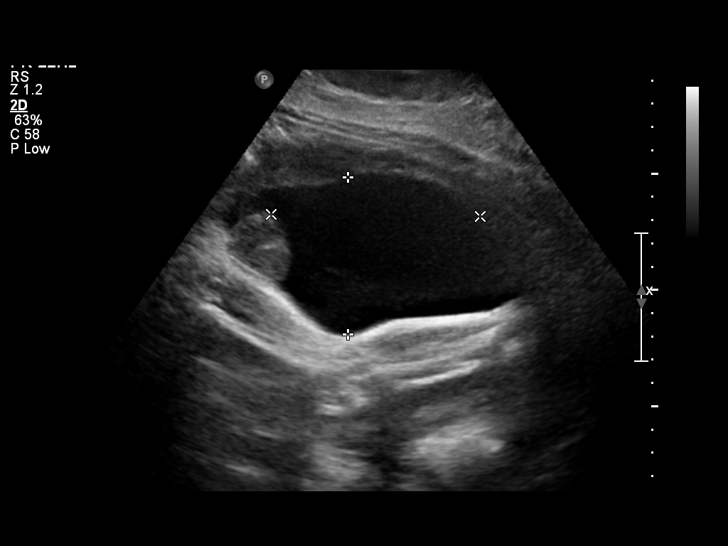
[im 5/17]
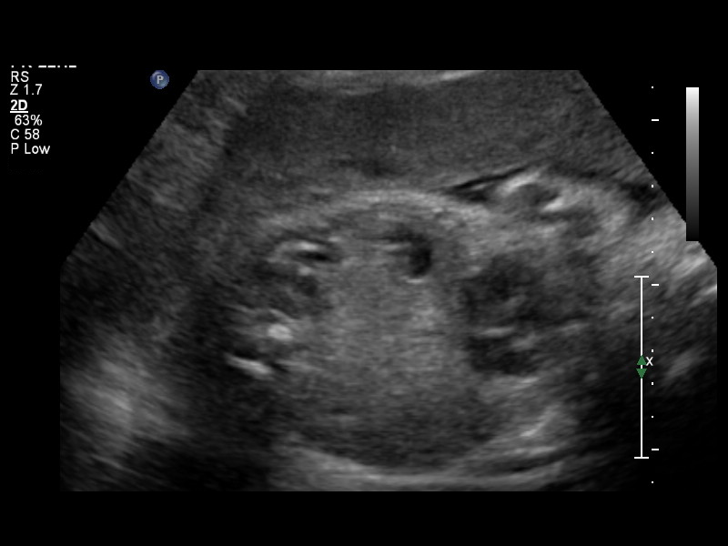
[im 6/17]
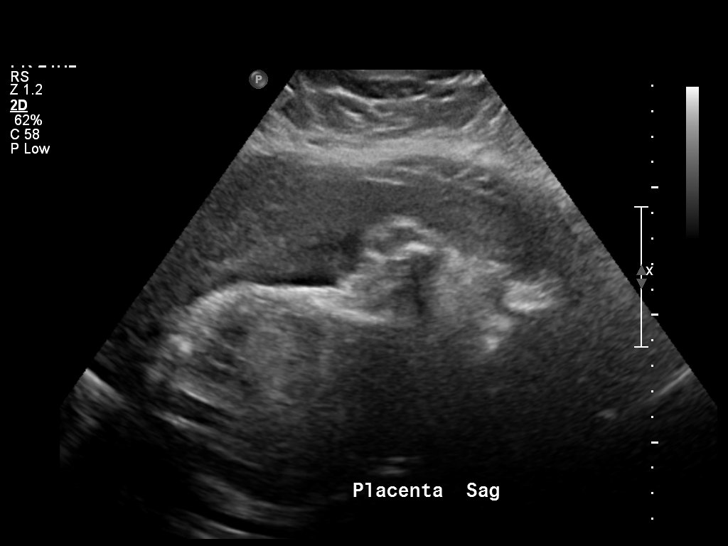
[im 8/17]
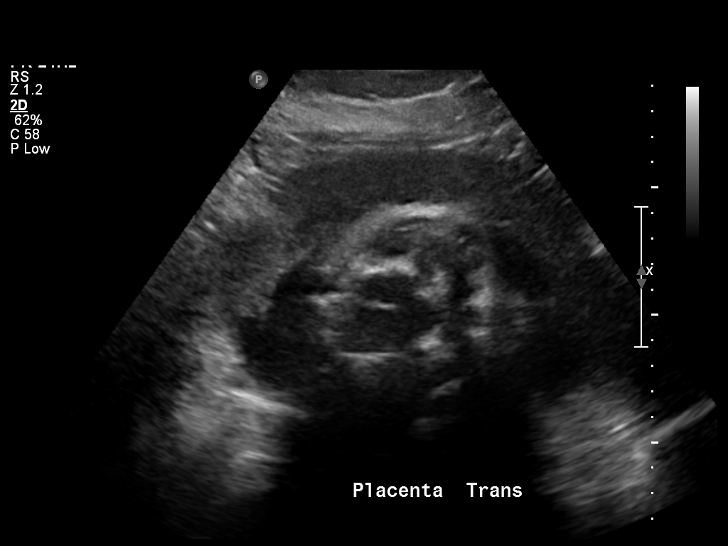
[im 9/17]
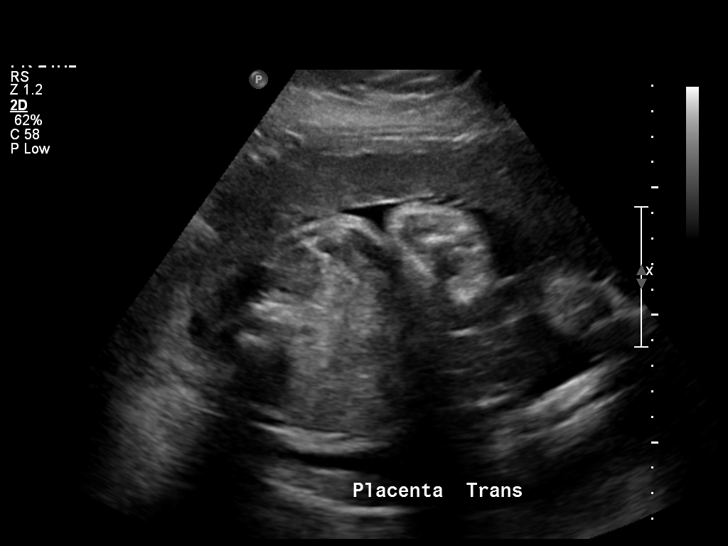
[im 10/17]
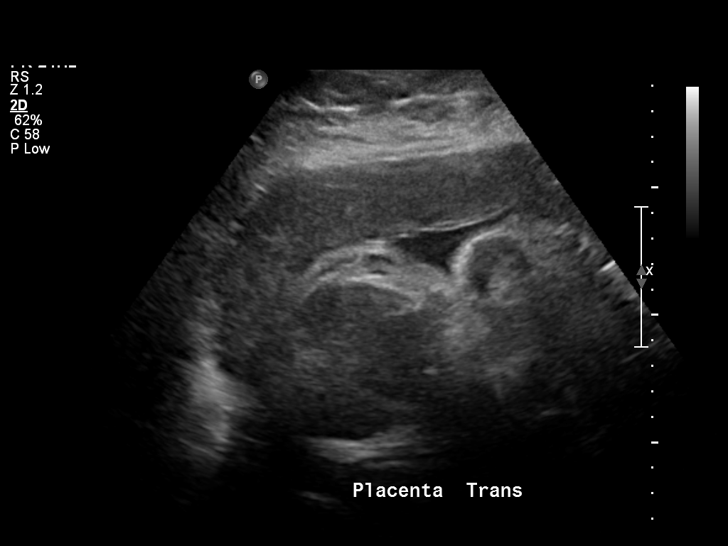
[im 12/17]
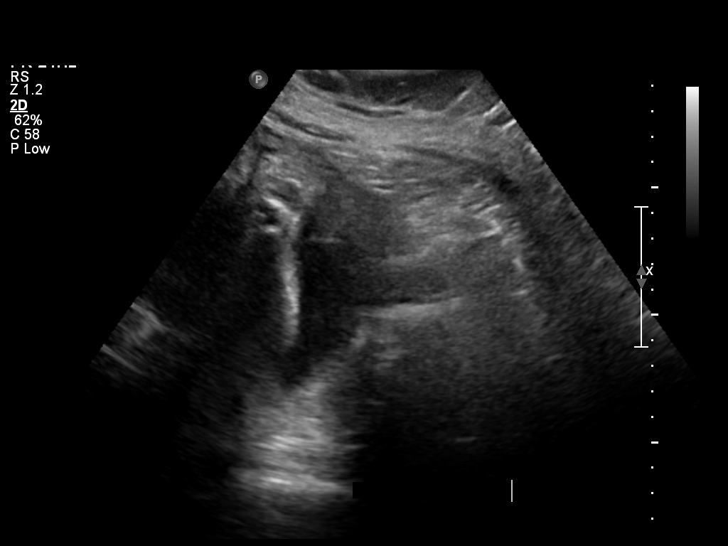
[im 13/17]
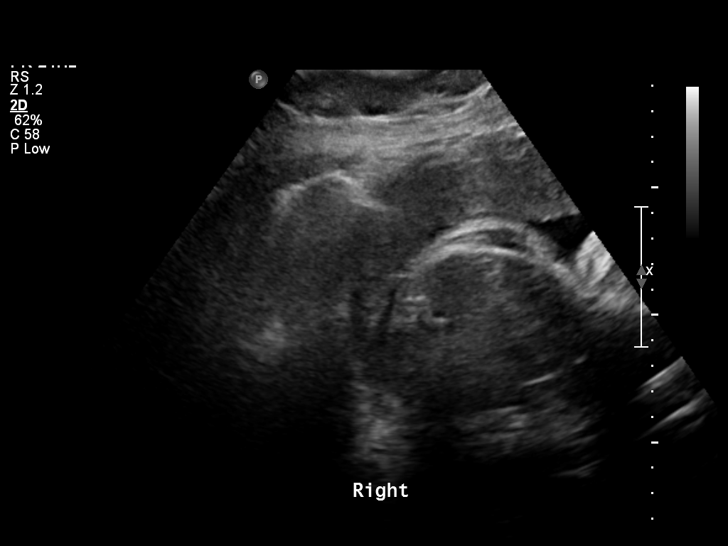
[im 14/17]
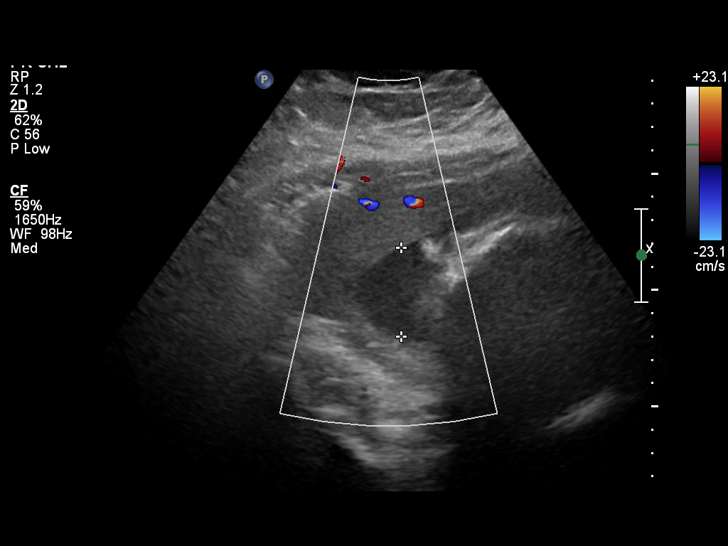
[im 16/17]
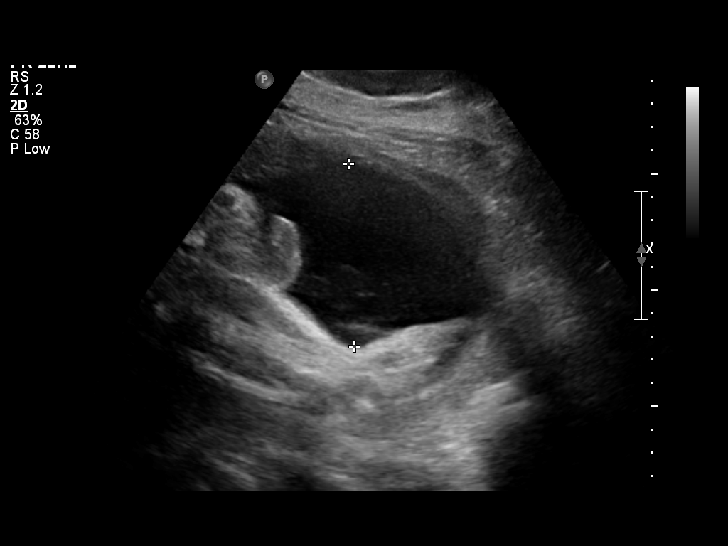
[im 17/17]
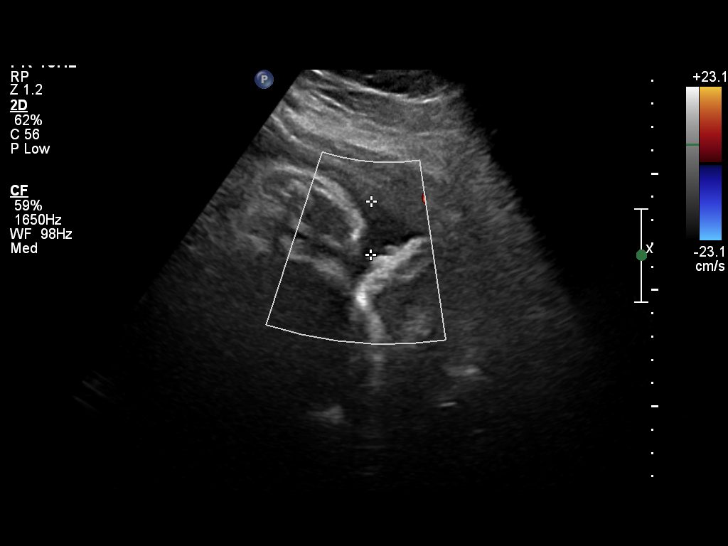

[13 of 17 positions shown; findings below may reference images not displayed]

OBSTETRICS REPORT
                      (Signed Final 10/19/2010 [DATE])

Procedures

 US FETAL BPP W/O NONSTRESS                            76819.0
 [HOSPITAL]                                         76815.0
Indications

 Decreased fetal movement
 Assess fetal well being
 Non-reactive NST but (reassuring)
Fetal Evaluation

 Fetal Heart Rate:  147                          bpm
 Cardiac Activity:  Observed
 Presentation:      Cephalic
 Placenta:          Anterior, above cervical os

 Amniotic Fluid
 AFI FV:      Subjectively within normal limits
 AFI Sum:     17.11   cm       62  %Tile     Larg Pckt:    7.82  cm
 RUQ:   3.21    cm   RLQ:    7.82   cm    LUQ:   2.28    cm   LLQ:    3.8    cm
Biophysical Evaluation

 Amniotic F.V:   Pocket => 2 cm two         F. Tone:        Observed
                 planes
 F. Movement:    Observed                   Score:          [DATE]
 F. Breathing:   Observed
Gestational Age

 Best:          34w 1d     Det. By:  U/S C R L (04/04/10)     EDD:   11/29/10
Cervix Uterus Adnexa

 Cervix:       Not visualized (advanced GA >34 wks)
 Uterus:       No abnormality visualized.
 Cul De Sac:   No free fluid seen.
 Left Ovary:    Not visualized.
 Right Ovary:   Not visualized.

 Adnexa:     No abnormality visualized.
Impression

 Single live IUP in cephalic presentation.
 BPP [DATE].

 questions or concerns.

## 2012-11-11 NOTE — Telephone Encounter (Signed)
Melissa A Mayer Camel 11/11/2012 9:23 AM Signed  Pt rtn call , pls call

## 2012-11-11 NOTE — Telephone Encounter (Signed)
Yes per Dr Lubertha Basque note in December was to restart both medications

## 2012-11-11 NOTE — Telephone Encounter (Signed)
Pt rtn call, pls call  °

## 2012-11-11 NOTE — Telephone Encounter (Signed)
This encounter was created in error - please disregard.

## 2012-11-14 IMAGING — US US OB FOLLOW-UP
1 series · 14 of 28 positions shown · non-contrast
Comparison: none

[Series 1: us ob follow-up · 14 of 28 slices shown]
[im 2/28]
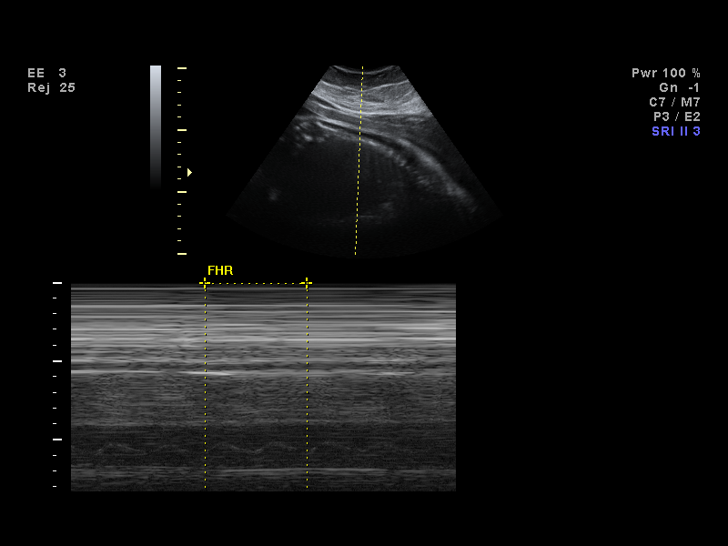
[im 4/28]
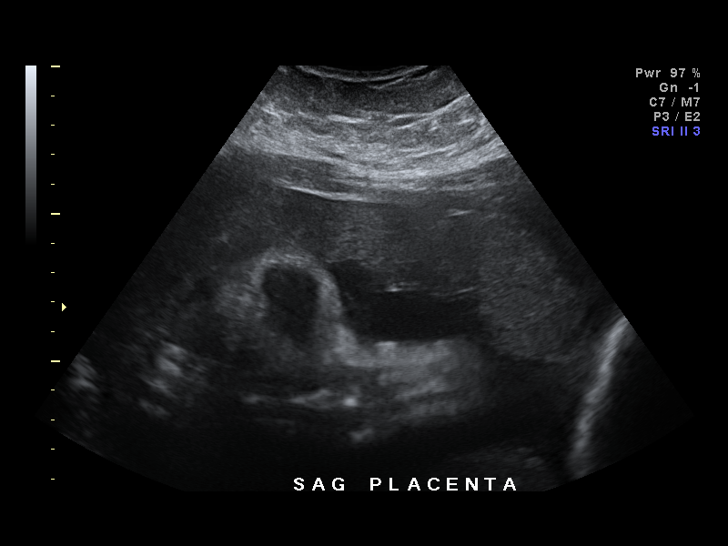
[im 6/28]
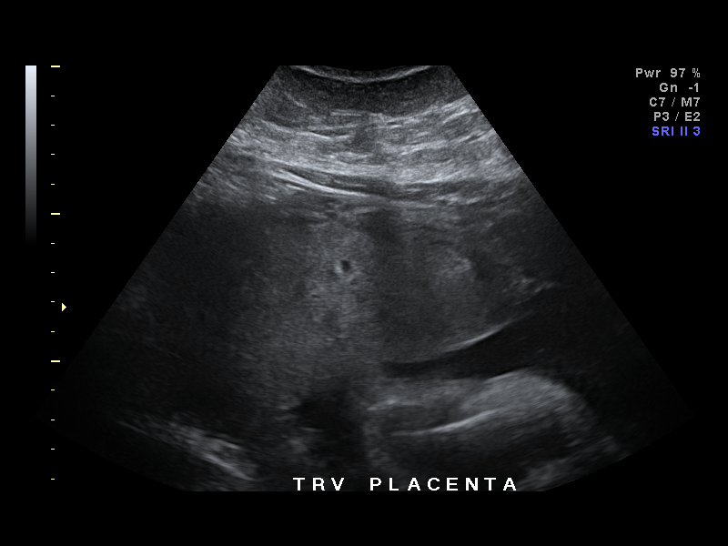
[im 8/28]
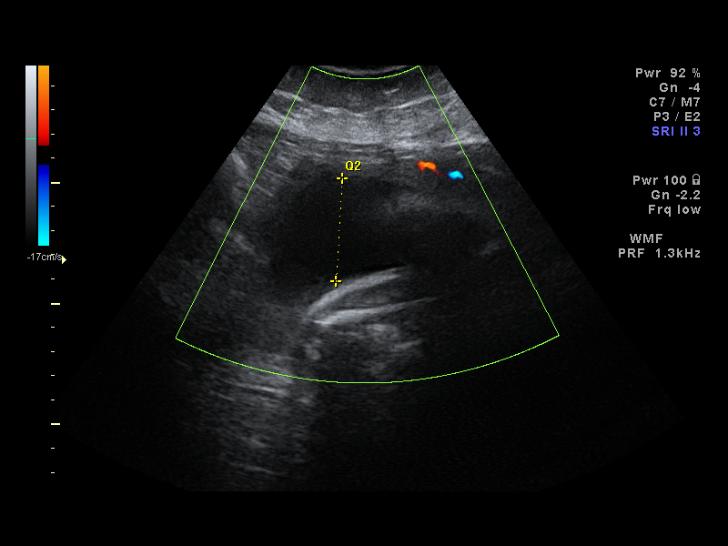
[im 10/28]
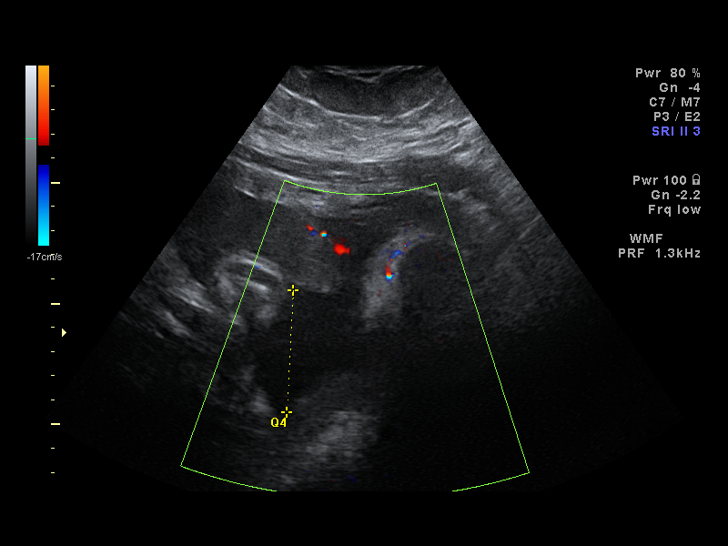
[im 12/28]
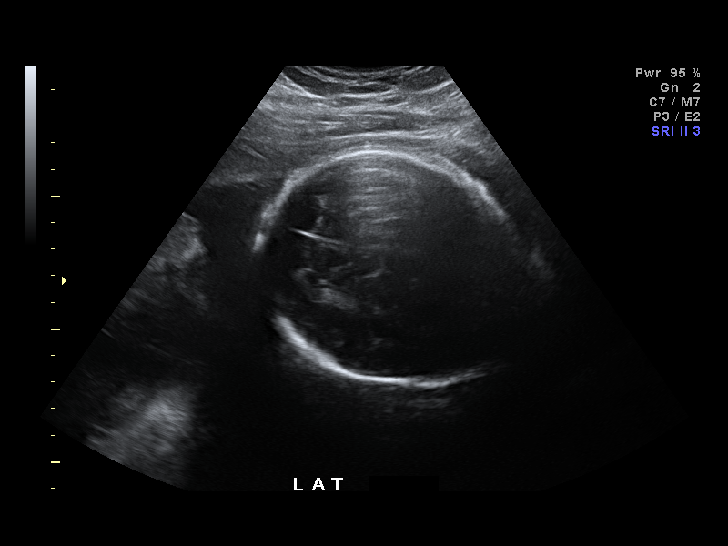
[im 14/28]
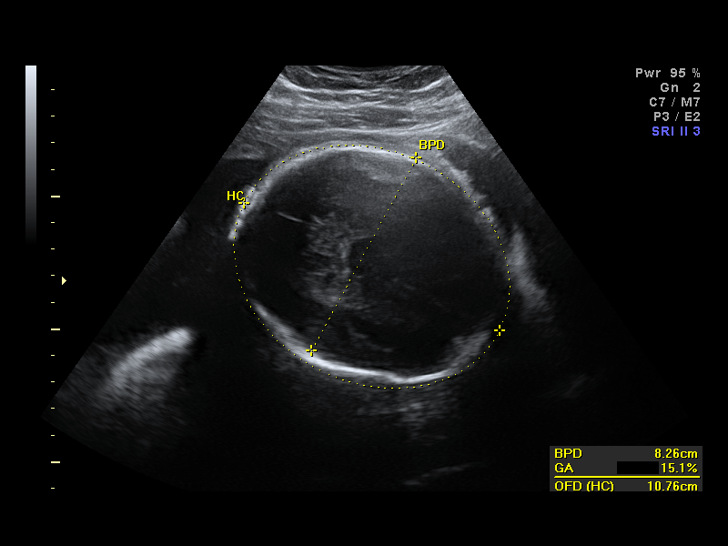
[im 16/28]
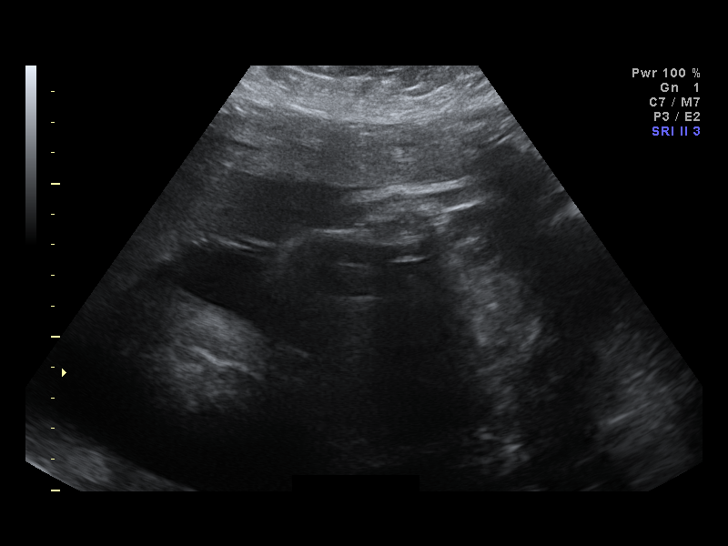
[im 18/28]
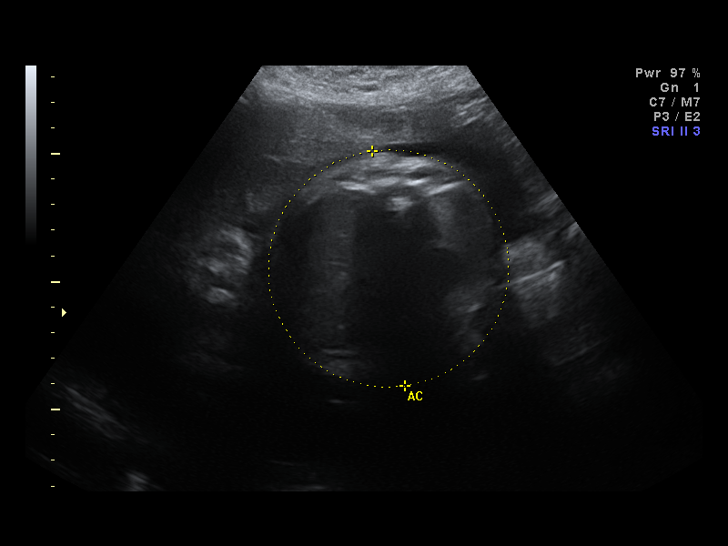
[im 20/28]
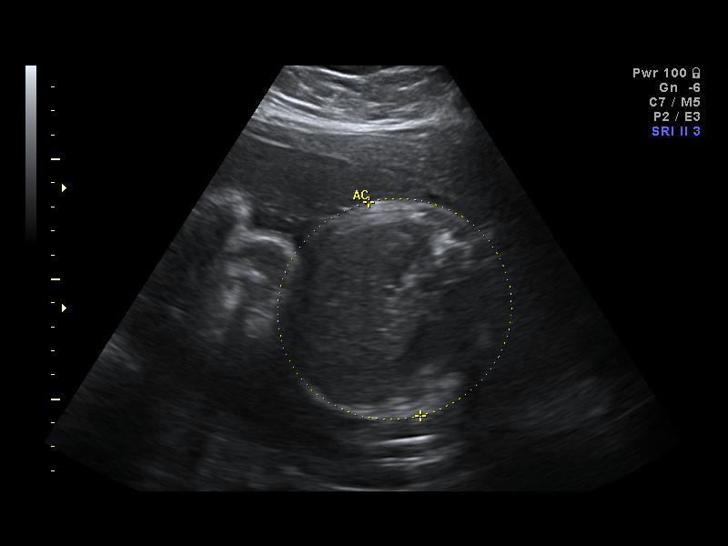
[im 22/28]
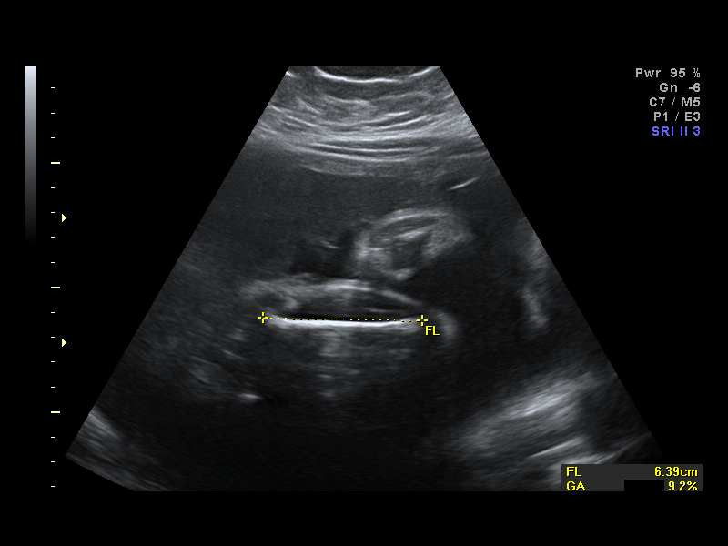
[im 24/28]
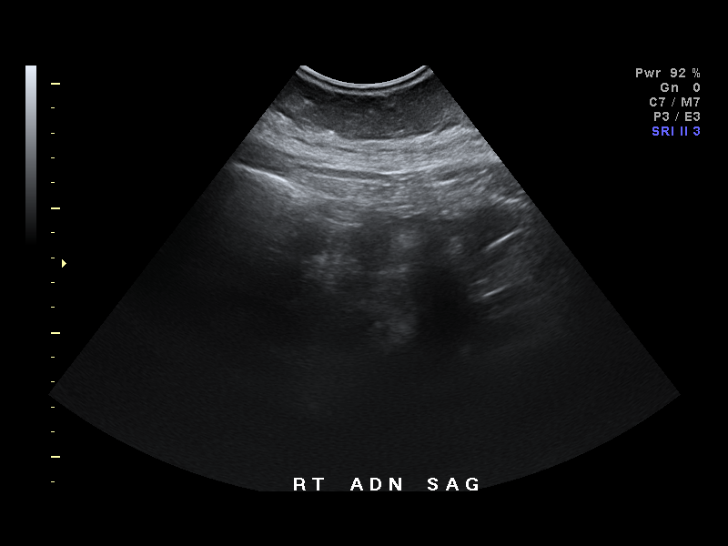
[im 26/28]
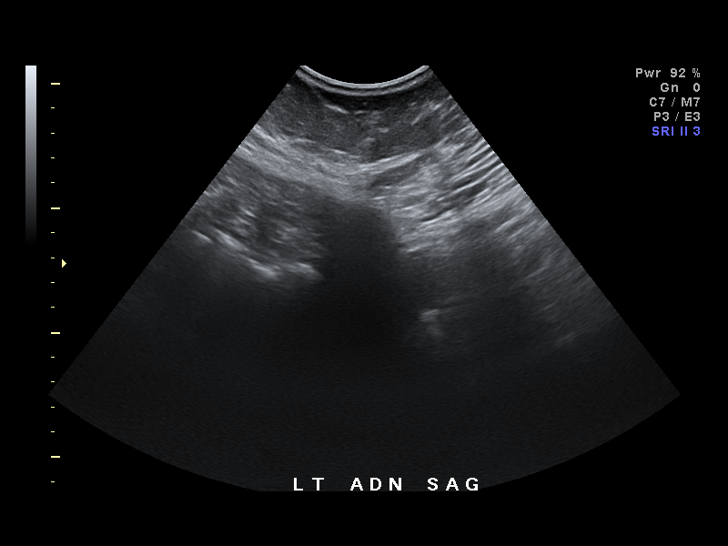
[im 28/28]
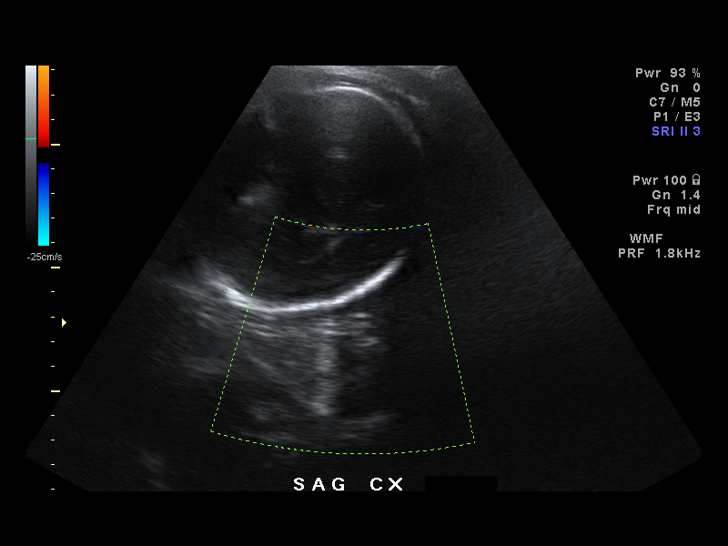

[14 of 28 positions shown; findings below may reference images not displayed]

Canned report from images found in remote index.

Refer to host system for actual result text.

## 2012-11-22 ENCOUNTER — Telehealth: Payer: Self-pay | Admitting: Emergency Medicine

## 2012-11-22 DIAGNOSIS — Z0000 Encounter for general adult medical examination without abnormal findings: Secondary | ICD-10-CM

## 2012-11-22 NOTE — Telephone Encounter (Signed)
Will fwd. To PCP for review .Sayda Grable  

## 2012-11-22 NOTE — Telephone Encounter (Signed)
Referral placed for routine eye exam.

## 2012-11-22 NOTE — Telephone Encounter (Signed)
Pt is needing a routine eye exam and they told her that we needed to make the referral -Dr Nadyne Coombes -  [phone] She has medicare/medicaid

## 2012-11-25 ENCOUNTER — Encounter (HOSPITAL_COMMUNITY): Payer: Self-pay | Admitting: Pharmacy Technician

## 2012-11-30 ENCOUNTER — Other Ambulatory Visit: Payer: Medicare Other

## 2012-11-30 ENCOUNTER — Other Ambulatory Visit: Payer: Self-pay | Admitting: Emergency Medicine

## 2012-11-30 DIAGNOSIS — I5022 Chronic systolic (congestive) heart failure: Secondary | ICD-10-CM

## 2012-11-30 DIAGNOSIS — Z0100 Encounter for examination of eyes and vision without abnormal findings: Secondary | ICD-10-CM

## 2012-11-30 LAB — CBC
MCH: 27.6 pg (ref 26.0–34.0)
MCHC: 33.4 g/dL (ref 30.0–36.0)
Platelets: 242 10*3/uL (ref 150–400)
RBC: 4.63 MIL/uL (ref 3.87–5.11)
RDW: 14.4 % (ref 11.5–15.5)

## 2012-11-30 LAB — FERRITIN: Ferritin: 74 ng/mL (ref 10–291)

## 2012-11-30 IMAGING — US US FETAL BPP W/O NONSTRESS
1 series · 14 of 19 positions shown · non-contrast
Comparison: none

[Series 1: us fetal bpp w/o nonstress · non-contrast · 19 acquisitions, 14 frames shown]
[im 1/19]
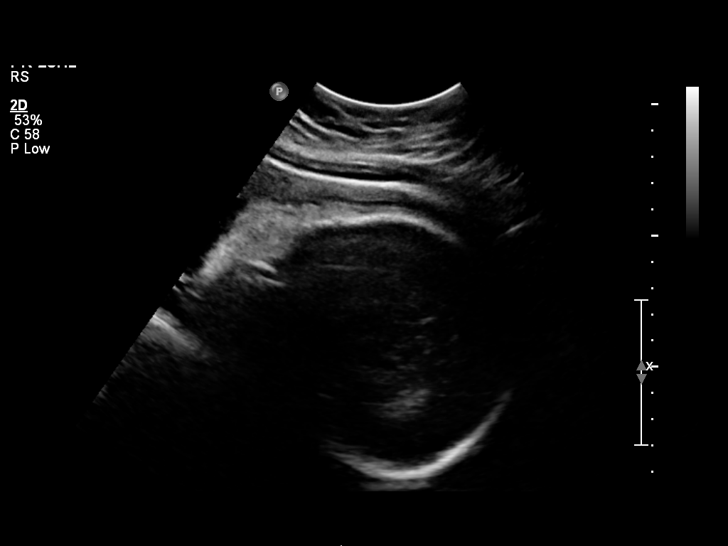
[im 3/19]
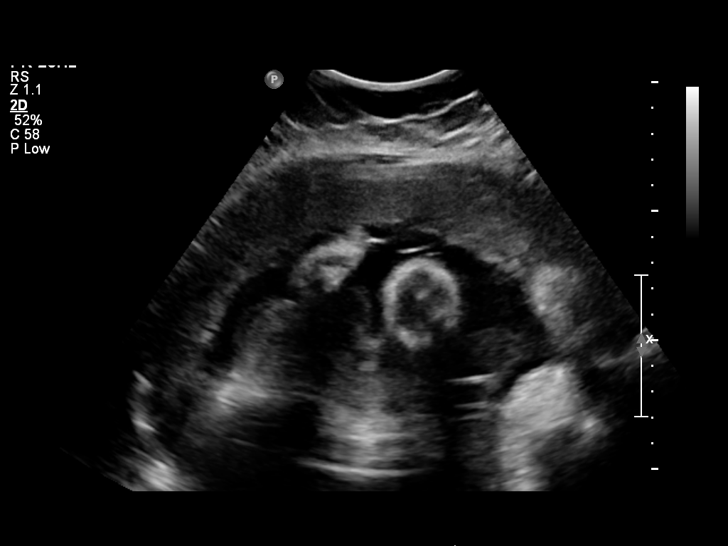
[im 4/19]
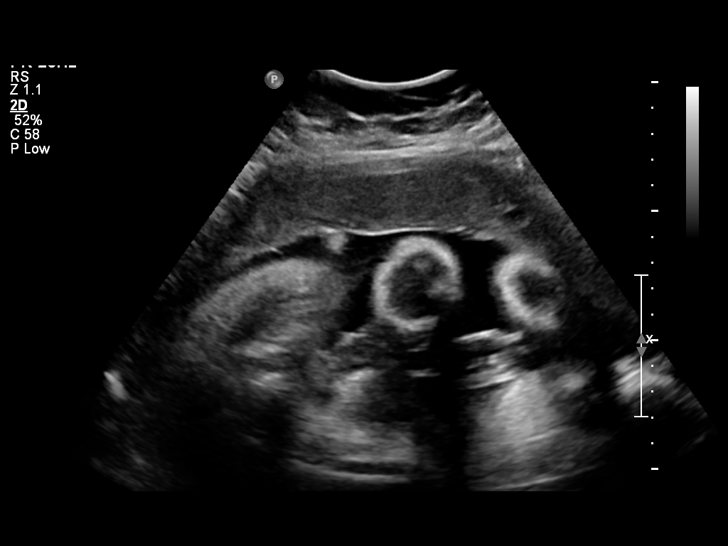
[im 5/19]
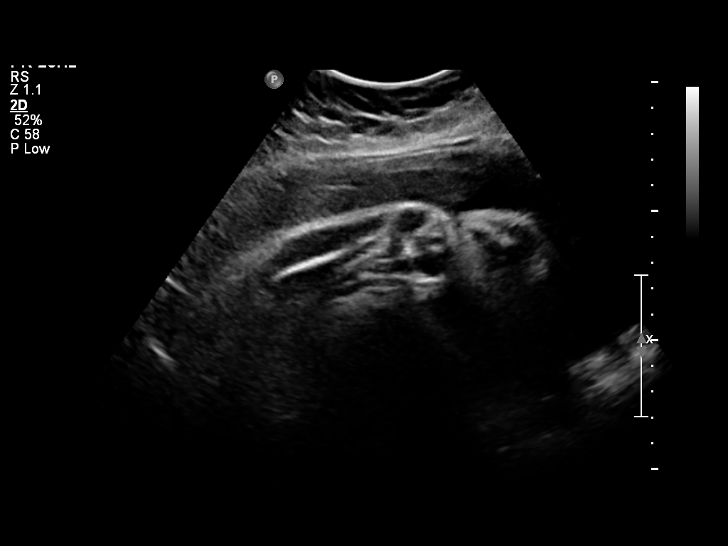
[im 7/19]
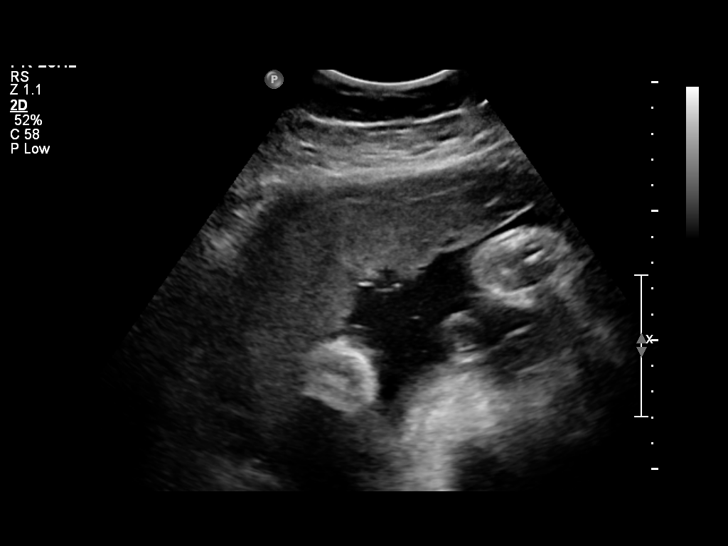
[im 8/19]
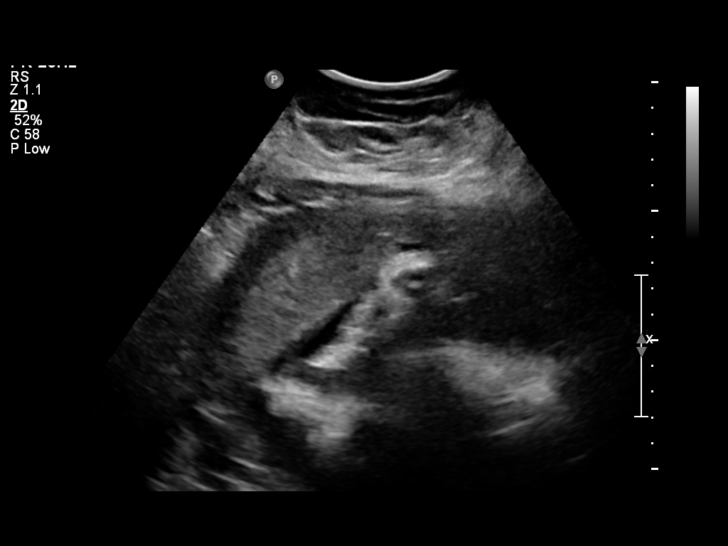
[im 9/19]
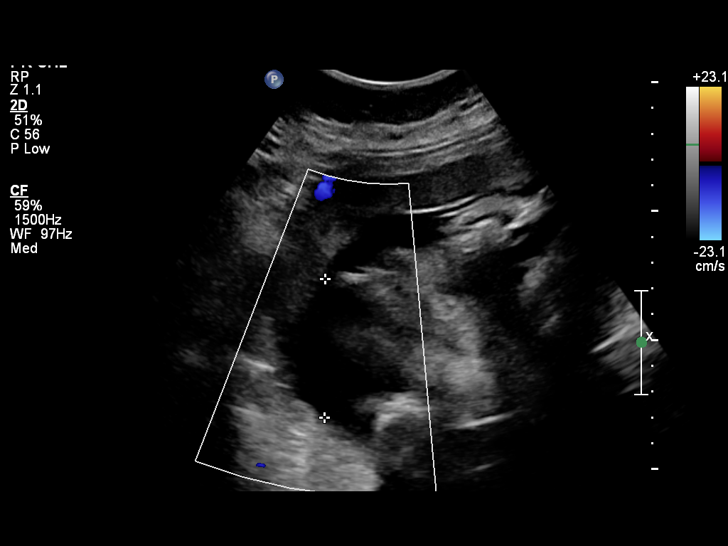
[im 11/19]
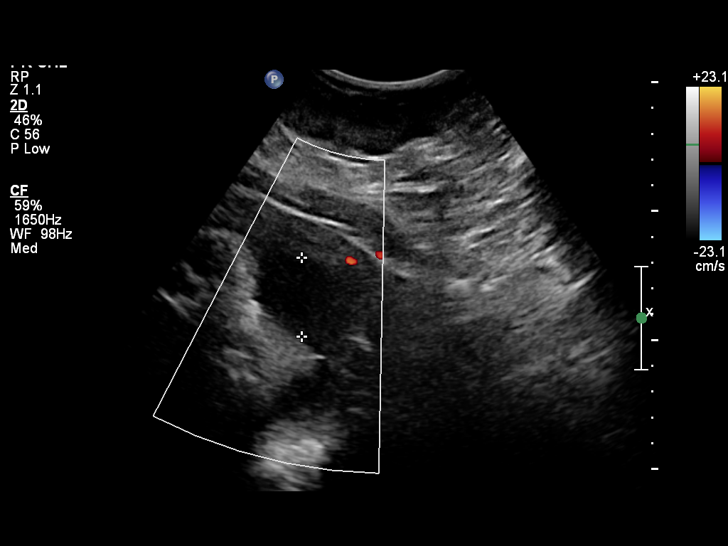
[im 12/19]
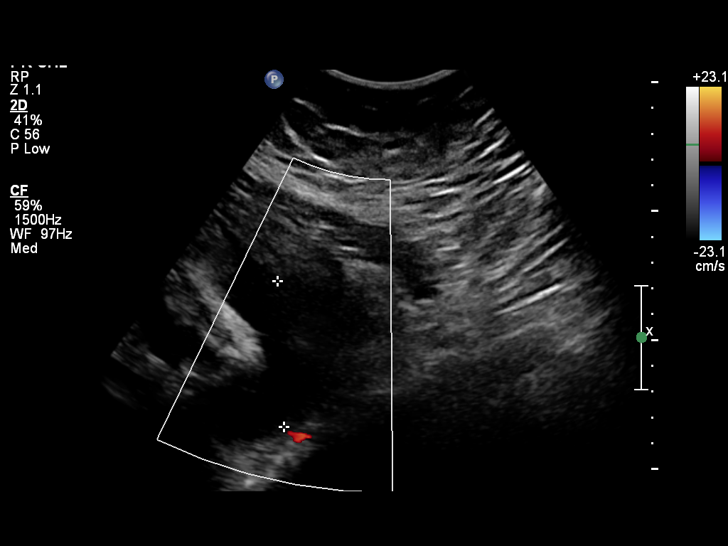
[im 13/19]
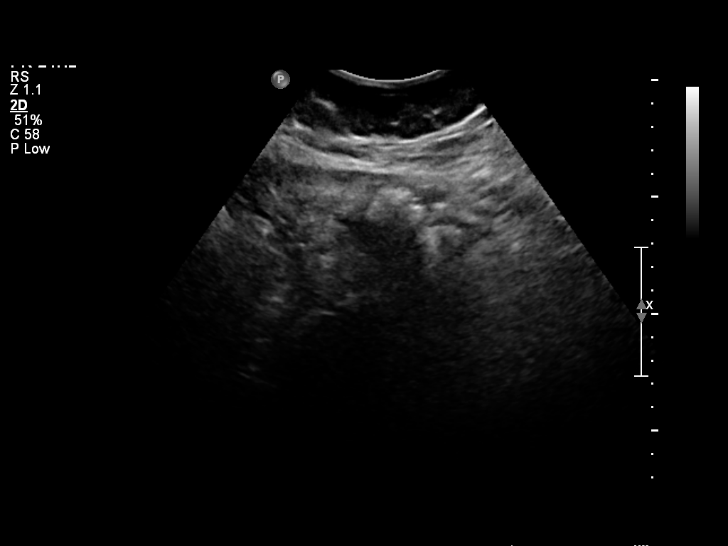
[im 15/19]
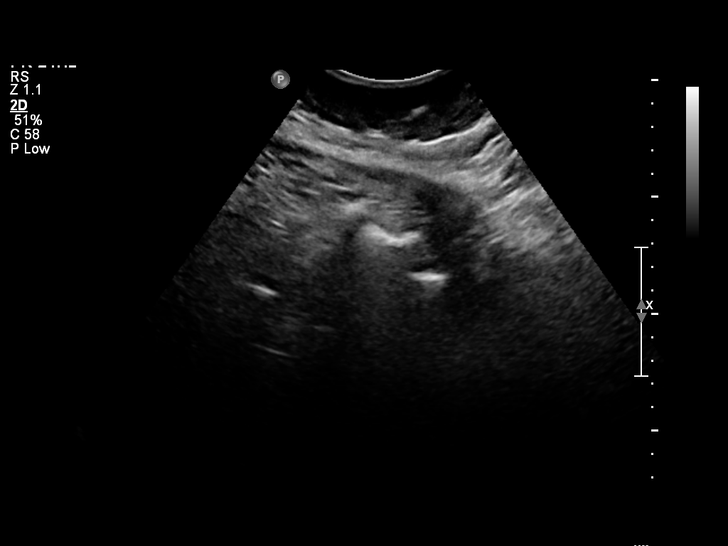
[im 16/19]
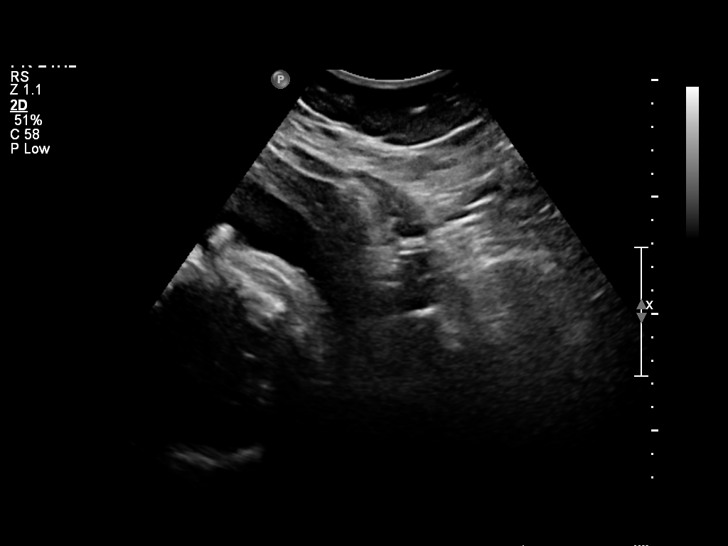
[im 17/19]
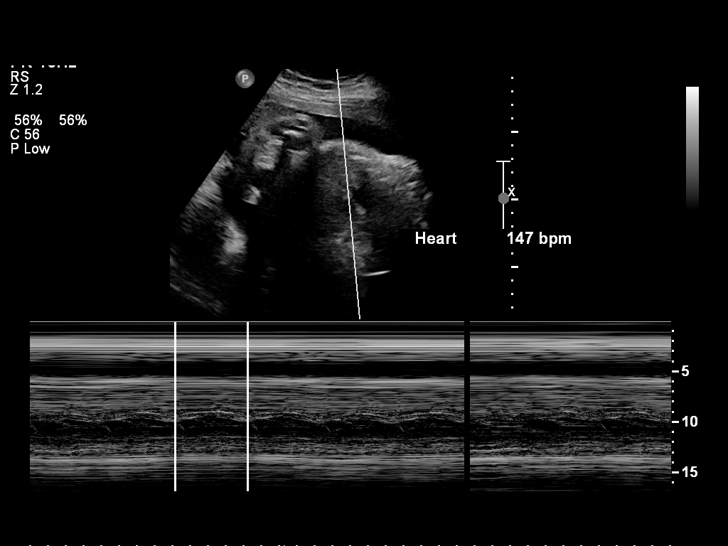
[im 19/19]
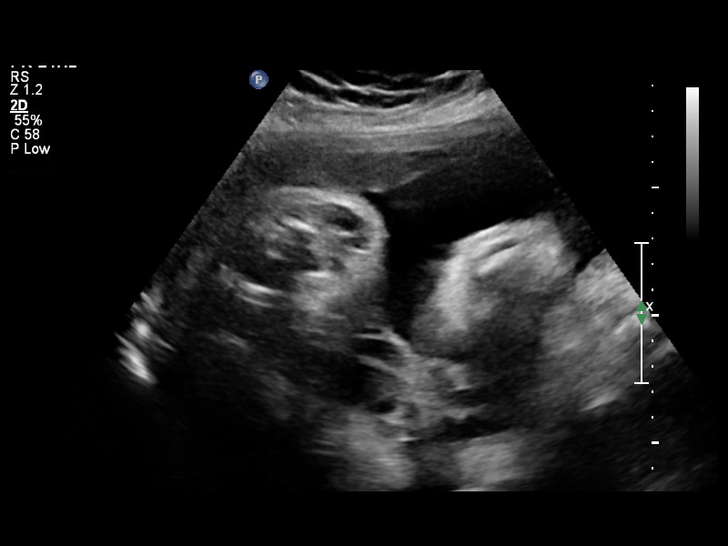

[14 of 19 positions shown; findings below may reference images not displayed]

Canned report from images found in remote index.

Refer to host system for actual result text.

## 2012-11-30 NOTE — H&P (Signed)
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 Discussed  Having drainage from the neck again. On exam, there is a midline draining sinus at the center of the scar from the surgery she had about 20 years ago. 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 She will schedule that at her convenience. Reason For Visit  Check cyst has been draining. Allergies  Zoloft TABS. Current Meds  No Reported Medications;; RPT. Active Problems  Allergic rhinitis  (477.9) (J30.9) Cardiac failure  (428.9) (I50.9) Cervicalgia  (723.1) (M54.2) ESOPHAGEAL REFLUX_  (530.81) Heartburn  (787.1) (R12) Migraine headache  (346.90) (G43.909) Rheumatoid arthritis  (714.0) (M06.9) Thyroglossal cyst  (759.2) (Q89.2). PMH  Pacemaker Placement. PSH  Cesarean Section Cholecystectomy. Family Hx  Coronary Artery Disease (V17.49) Family history of allergies (V19.6) (Z84.89) Family history of cardiac disorder: Mother (V17.49) (Z82.49) Family history of mental retardation (V18.4) (Z81.0) Hypertension (V17.49) Migraine Headache Reported Family History Of Allergies Rheumatoid Arthritis. Personal Hx  Current non-smoker (V49.89) (Z78.9) Marital History - Single Never Drank Alcohol Never smoker No caffeine use. ROS  Systemic: Feeling tired (fatigue).  No fever, no night sweats, and no recent weight loss. Head: No headache. Eyes: No eye symptoms. Otolaryngeal: No hearing loss, no earache, and no tinnitus.  Purulent nasal discharge.  No nasal passage blockage (stuffiness)  and no snoring.  Sneezing.  No hoarseness.  Sore throat. Cardiovascular: No chest pain or discomfort.  Palpitations. Pulmonary: No dyspnea, no cough, and no wheezing. Gastrointestinal: No dysphagia.  Heartburn.  No nausea, no abdominal pain, and no melena.  No diarrhea. Genitourinary: No dysuria. Endocrine: No muscle weakness. Musculoskeletal: No calf muscle cramps, no arthralgias,  and no soft tissue swelling. Neurological: Dizziness.  No fainting, no tingling, and no numbness. Psychological: Anxiety  and depression. Skin: No rash. Signature  Electronically signed by : Serena Colonel  M.D.; 11/08/2012 3:07 PM EST.

## 2012-11-30 NOTE — Progress Notes (Signed)
CBC ,FERRITIN AND TIBC DONE TODAY Makayla Vasquez 

## 2012-12-02 ENCOUNTER — Encounter (HOSPITAL_COMMUNITY)
Admission: RE | Admit: 2012-12-02 | Discharge: 2012-12-02 | Disposition: A | Discharge status: Home / Self Care | Payer: Medicare Other | Source: Ambulatory Visit | Attending: Otolaryngology | Admitting: Otolaryngology

## 2012-12-02 NOTE — Pre-Procedure Instructions (Signed)
Makayla Vasquez  12/02/2012   Your procedure is scheduled on:  Fri, Feb 21 @ 1:15 PM  Report to [facility] at 11:15 AM.  Call this number if you have problems the morning of surgery: [phone]   Remember:   Do not eat food or drink liquids after midnight.   Take these medicines the morning of surgery with A SIP OF WATER: Carvedilol(Coreg)   Do not wear jewelry, make-up or nail polish.  Do not wear lotions, powders, or perfumes. You may wear deodorant.  Do not shave 48 hours prior to surgery.  Do not bring valuables to the hospital.  Contacts, dentures or bridgework may not be worn into surgery.  Leave suitcase in the car. After surgery it may be brought to your room.  For patients admitted to the hospital, checkout time is 11:00 AM the day of  discharge.   Patients discharged the day of surgery will not be allowed to drive  home.    Special Instructions: Shower using CHG 2 nights before surgery and the night before surgery.  If you shower the day of surgery use CHG.  Use special wash - you have one bottle of CHG for all showers.  You should use approximately 1/3 of the bottle for each shower.   Please read over the following fact sheets that you were given: Pain Booklet, Coughing and Deep Breathing, MRSA Information and Surgical Site Infection Prevention

## 2012-12-06 ENCOUNTER — Telehealth: Payer: Self-pay | Admitting: Internal Medicine

## 2012-12-06 IMAGING — US US FETAL BPP W/O NONSTRESS
1 series · 13 of 19 positions shown · non-contrast
Comparison: none

[Series 1: us fetal bpp w/o nonstress · non-contrast · 19 acquisitions, 13 frames shown]
[im 1/19]
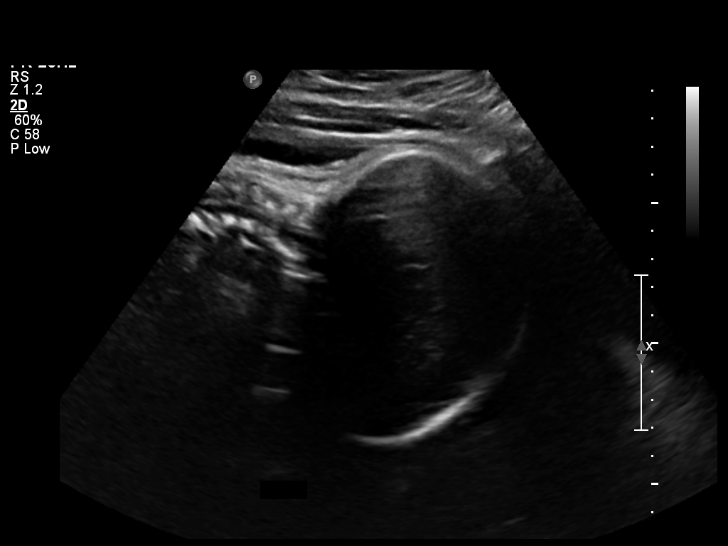
[im 3/19]
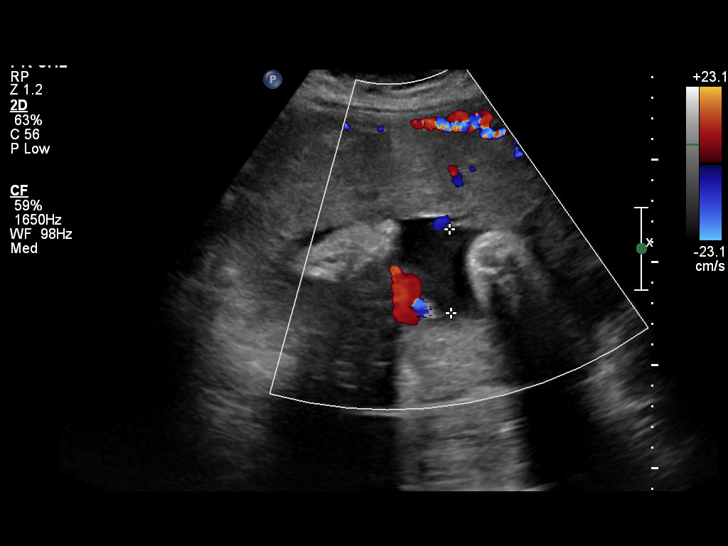
[im 4/19]
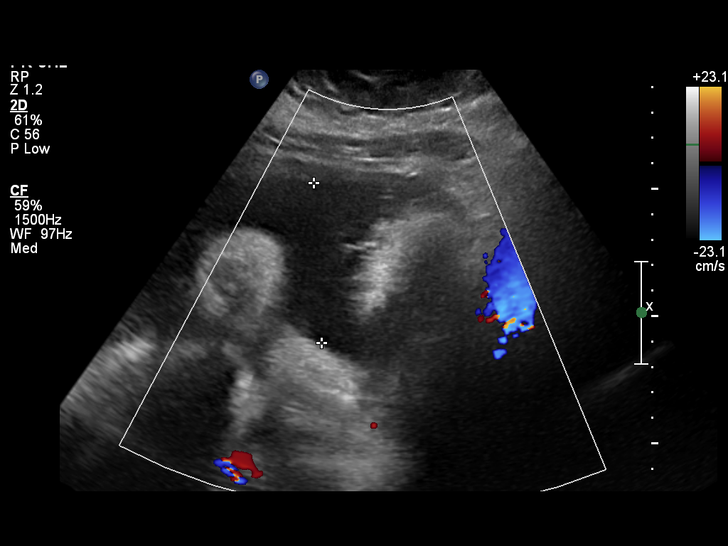
[im 6/19]
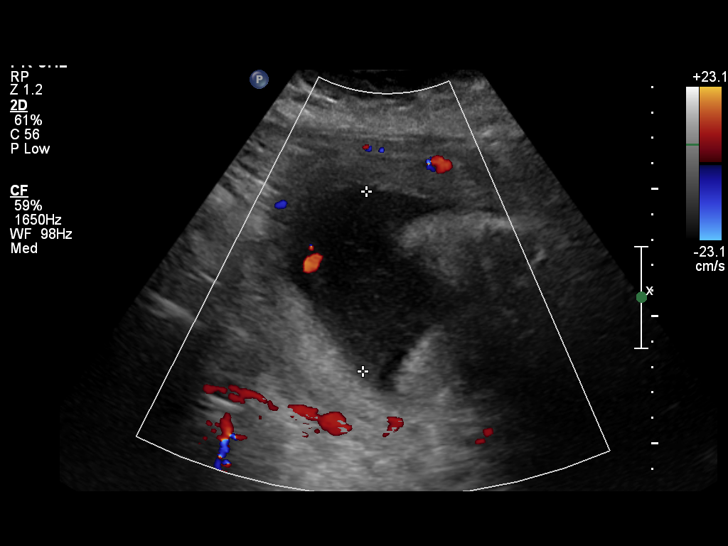
[im 7/19]
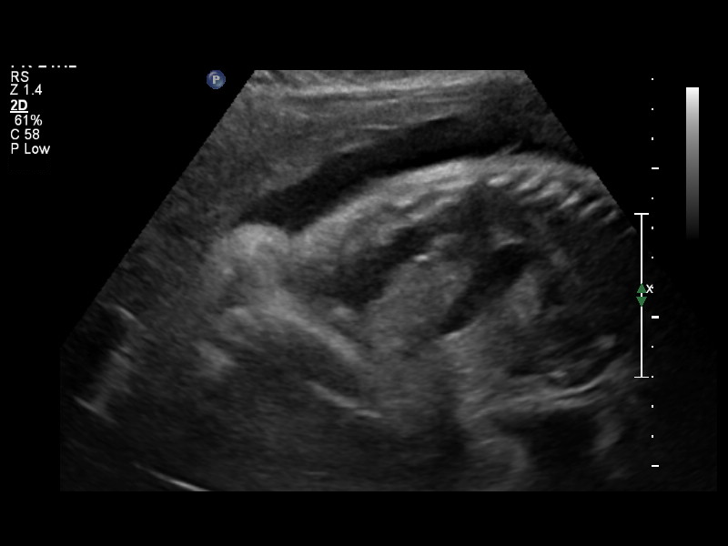
[im 9/19]
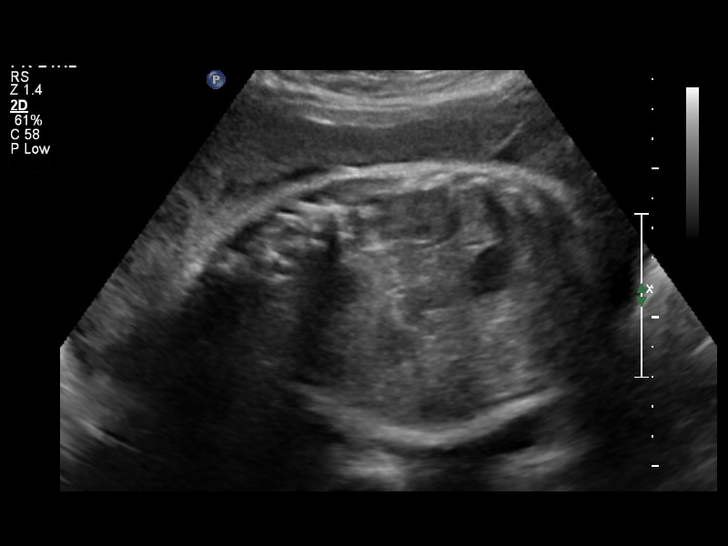
[im 10/19]
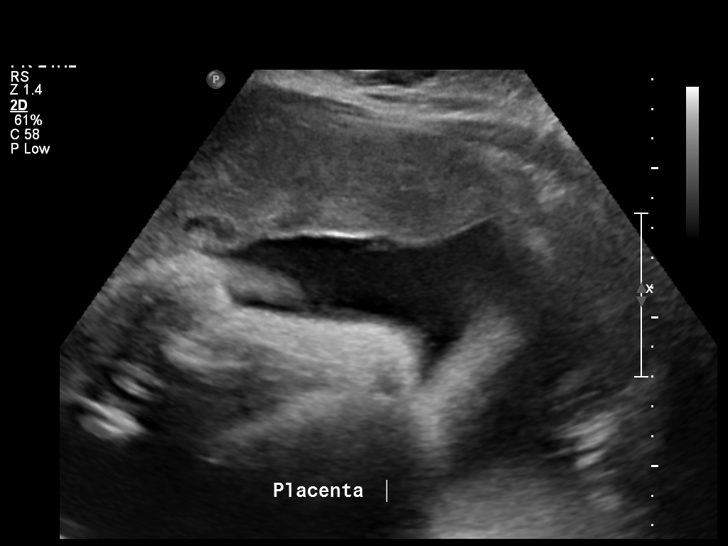
[im 11/19]
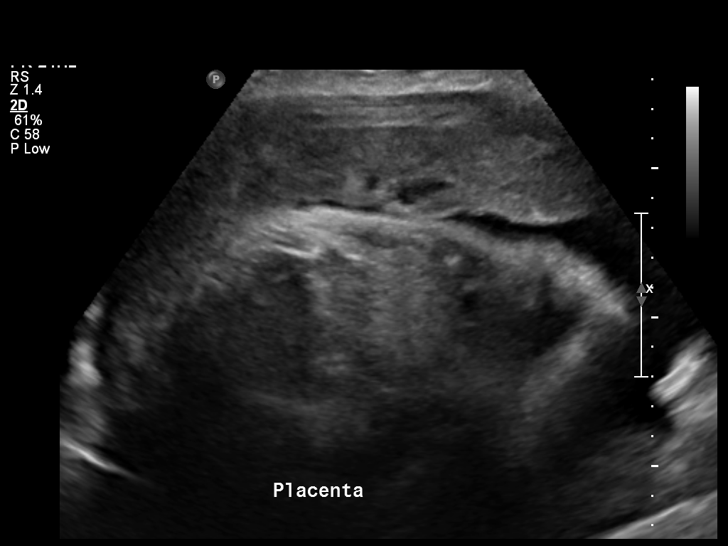
[im 13/19]
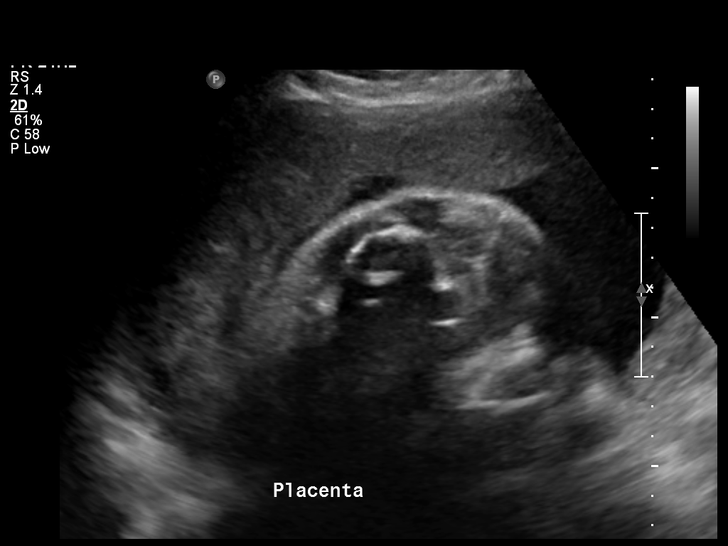
[im 14/19]
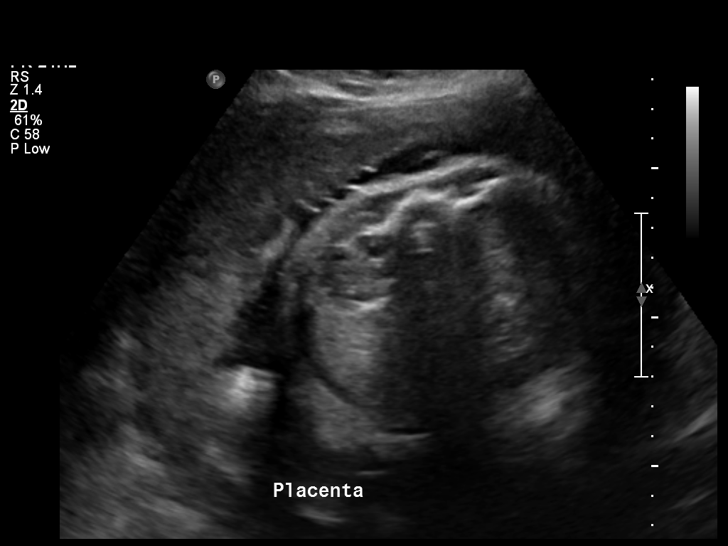
[im 16/19]
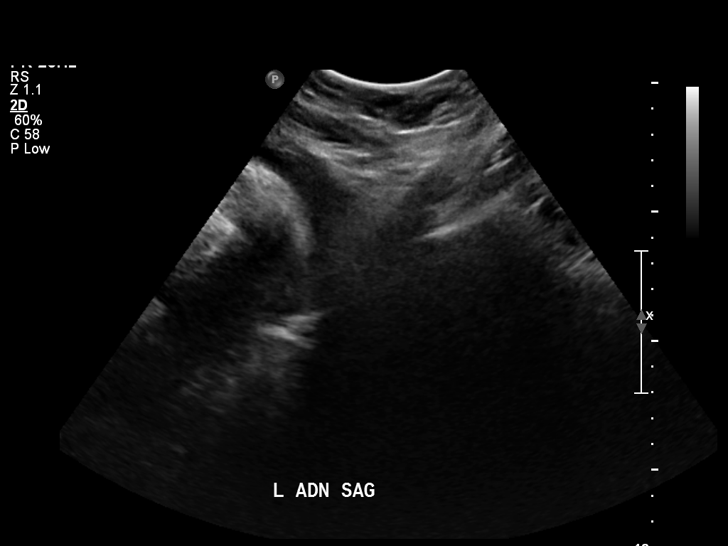
[im 17/19]
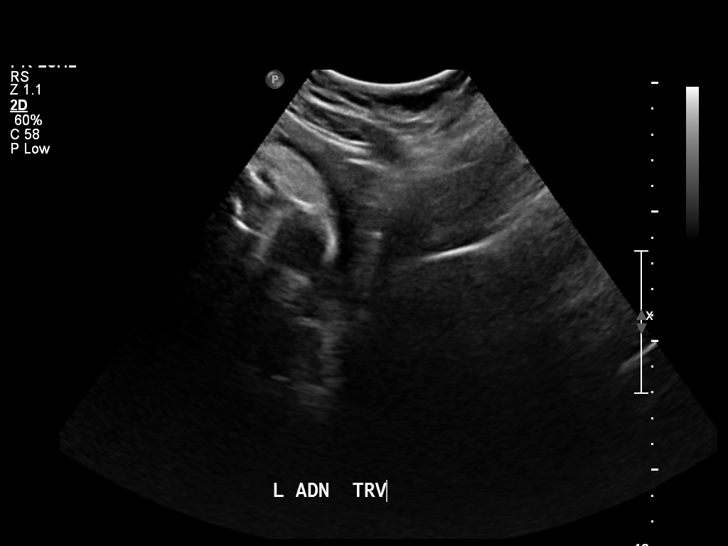
[im 19/19]
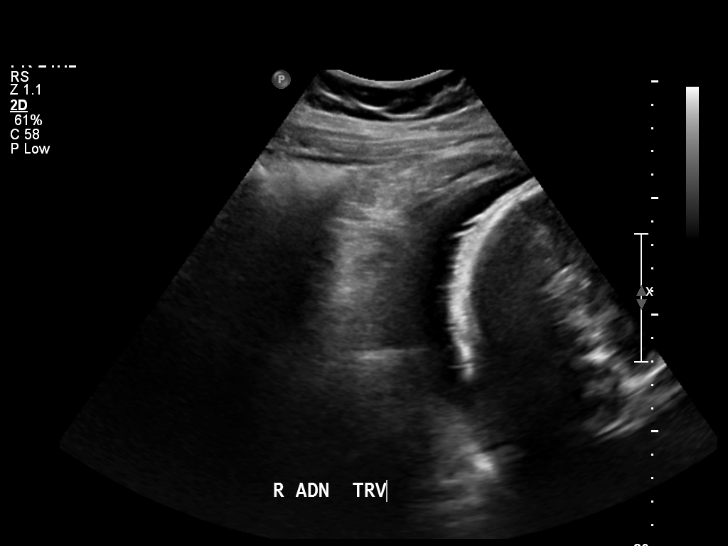

[13 of 19 positions shown; findings below may reference images not displayed]

OBSTETRICS REPORT
                      (Signed Final 11/13/2010 [DATE])

Procedures

 US FETAL BPP W/O NONSTRESS                            76819.0
 [HOSPITAL]                                         [id number]
Indications

 Spontaneous  Rupture of Membranes - leaking fluid
 History of maternal congenital heart block
 (pacemaker in place, following with cardiology)
 Bipolor disorder (no current meds)
 Asthma
 Rheumatoid arthritis (followed by rheumatology)
 Obesity
 RH Negative
Fetal Evaluation

 Fetal Heart Rate:  143                          bpm
 Cardiac Activity:  Observed
 Presentation:      Cephalic
 Placenta:          Anterior, above cervical os

 Amniotic Fluid
 AFI FV:      Subjectively within normal limits
 AFI Sum:     19.17   cm       75  %Tile     Larg Pckt:    7.03  cm
 RUQ:   4.06    cm   RLQ:    7.03   cm    LUQ:   1.83    cm   LLQ:    6.25   cm
Biophysical Evaluation

 Amniotic F.V:   Within normal limits       F. Tone:        Observed
 F. Movement:    Observed                   Score:          [DATE]
 F. Breathing:   Observed
Gestational Age

 Best:          37w 5d     Det. By:  U/S C R L (04/04/10)     EDD:   11/29/10
Cervix Uterus Adnexa

 Cervix:       Not visualized (advanced GA >34 wks)

 Adnexa:     No abnormality visualized.
Impression

 Single live IUP in cephalic presentation.  BPP [DATE].

 questions or concerns.

## 2012-12-06 NOTE — Telephone Encounter (Signed)
New Problem:    Called in to request cardiac clearance for the patient for a procedure on 12/10/12.  Please call back.

## 2012-12-06 NOTE — Consult Note (Addendum)
Anesthesia chart review: Patient is a 32 year old female scheduled for revision of thyroglossal duct cyst excision by Dr. Pollyann Kennedy on 12/10/2012.  Her PAT visit is not until 12/08/12.  History includes congenital heart block s/p PPM at age 20 with upgrade to a Medtronic biventricular device due to worsening LV dysfunction 02/2012, palpitations, chronic systolic CHF class 1 as of 09/2012, former smoker, obesity, HTN, GERD, anxiety, depression, Bipolar affective disorder, seizures.  Cardiologist is Dr. Ladona Ridgel, last visit 09/23/12 with one year follow-up recommended.  Current medication list includes b-blocker (Coreg) and ARB (Cozaar) therapy.  EKG on 03/09/12 showed paced rhythm at 61 bpm.  Echo on 02/26/12 showed: - Left ventricle: The cavity size was moderately dilated. Wall thickness was normal. Systolic function was moderately to severely reduced. The estimated ejection fraction was in the range of 30% to 35%. Diffuse hypokinesis. - Mitral valve: Mild to moderate regurgitation. - Line: A venous catheter was visualized in the superior vena cava, with its tip in the right atrium. No abnormal features noted. - Line: A venous catheter was visualized in the superior vena cava, with its tip in the right atrium. No abnormal features noted. (Her previous EF was 20-25% on 01/09/12 and 35-40% on 10/03/10.)  Nuclear stress test on 01/07/12 showed: LVE. Small apical infarct. Lots of artifact. No ischemia. Diffuse hypokinesis with serverely reduced EF LV Ejection Fraction: 29%. LV Wall Motion: Diffuse hypokinesis with servely reduced EF.   CXR on 03/09/12 showed: 1. Interval exchange of pacemaker device and placement of a new left ventricular lead, as above, without radiographic evidence of acute complications or acute cardiopulmonary disease.  2. Mild cardiomegaly.  Labs are pending her PAT visit.  I reviewed her cardiac history and previous stress and echo results with Anesthesiologist Dr. Michelle Piper.  He  recommended that Dr. Pollyann Kennedy get pre-operative input/clearance from Dr. Ladona Ridgel.  I have notified Jasmine December at Dr. Lucky Rathke office.  She will contact [facility]. (Update 12/07/12 1200: Dr. Ladona Ridgel has cleared patient for surgery with "low risk."  Shonna Chock, PA-C 12/06/12 1522

## 2012-12-07 IMAGING — US US FETAL BPP W/O NONSTRESS
1 series · 5 of 5 positions shown · non-contrast
Comparison: none

[Series 1: us fetal bpp w/o nonstress · non-contrast · 5 acquisitions, 5 frames shown]
[im 1/5]
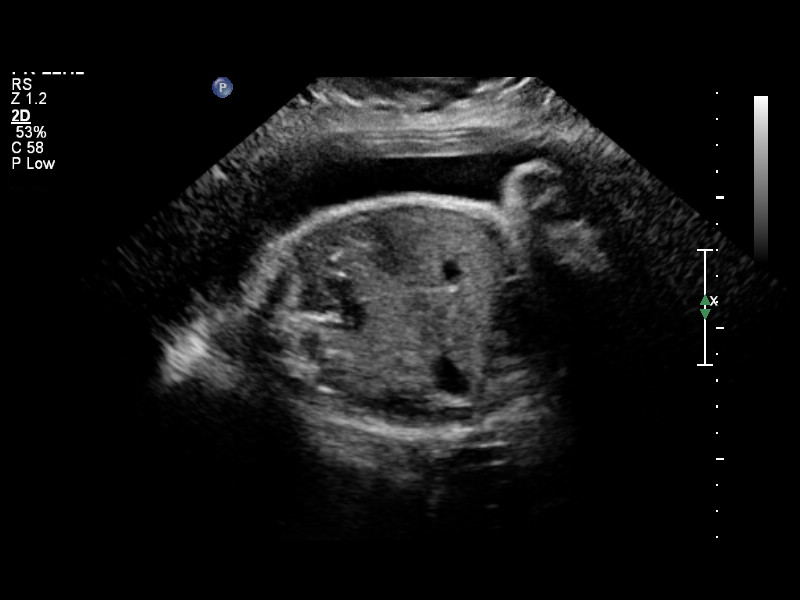
[im 2/5]
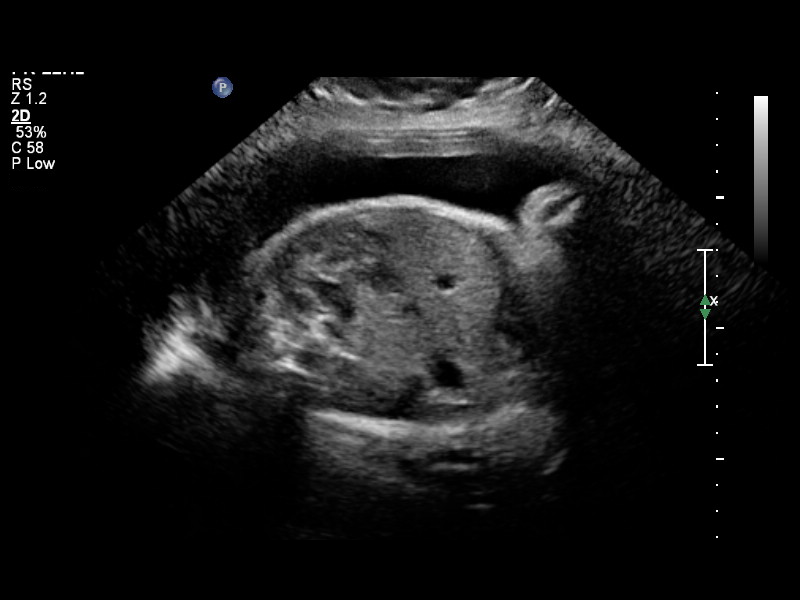
[im 3/5]
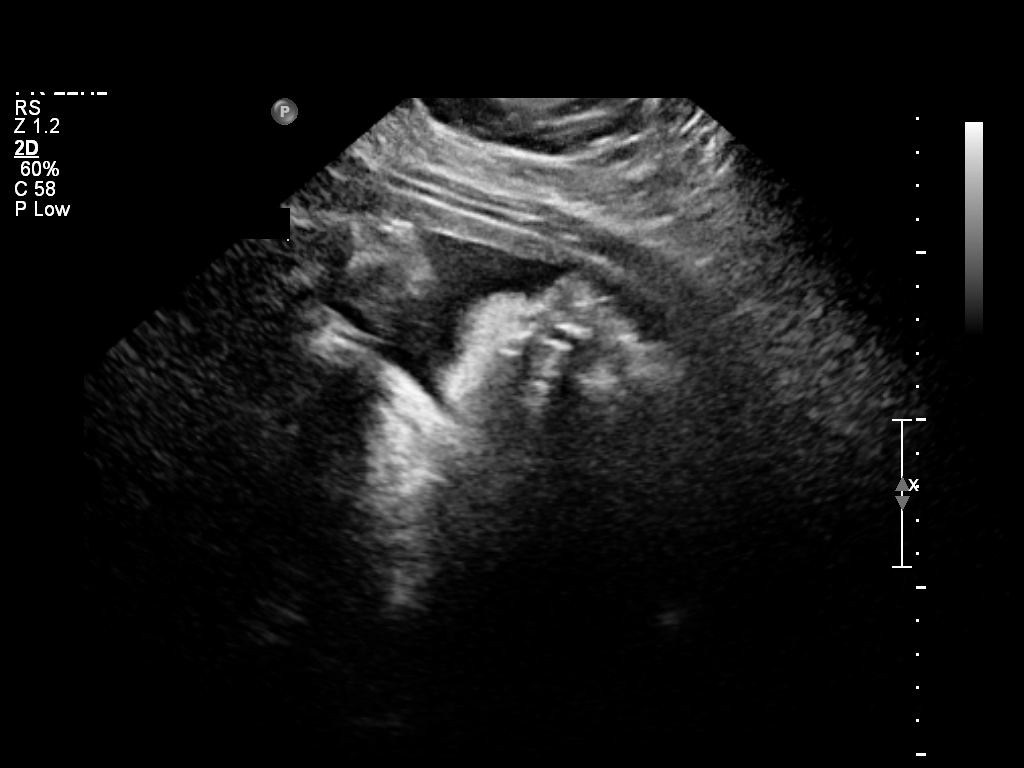
[im 4/5]
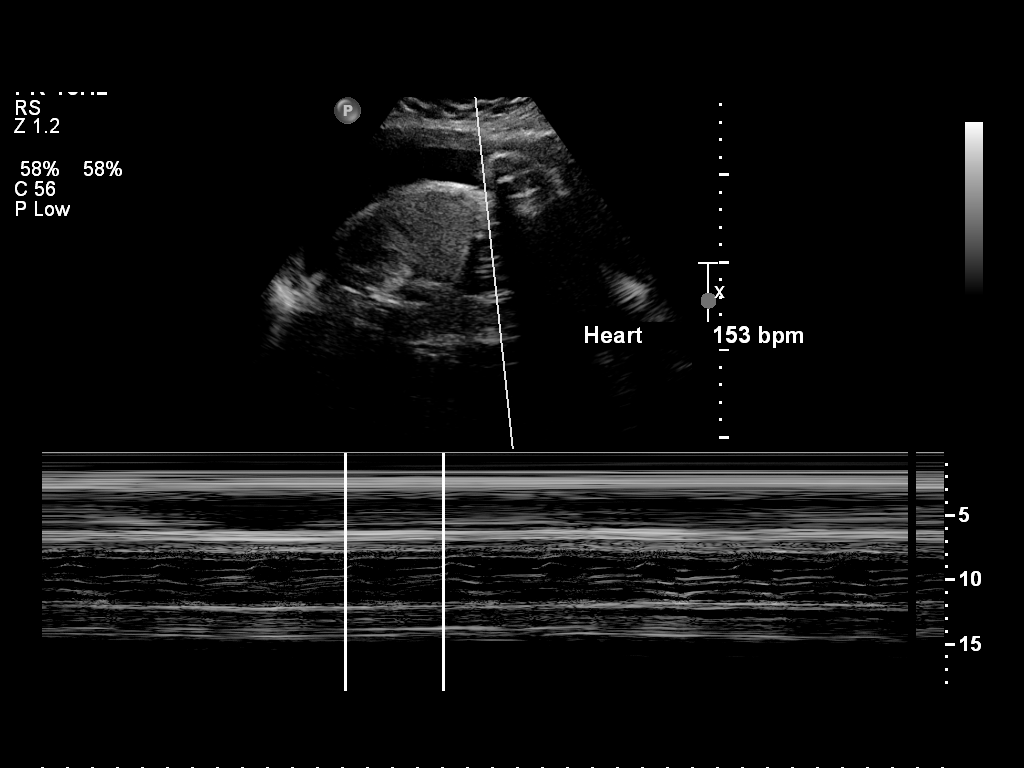
[im 5/5]
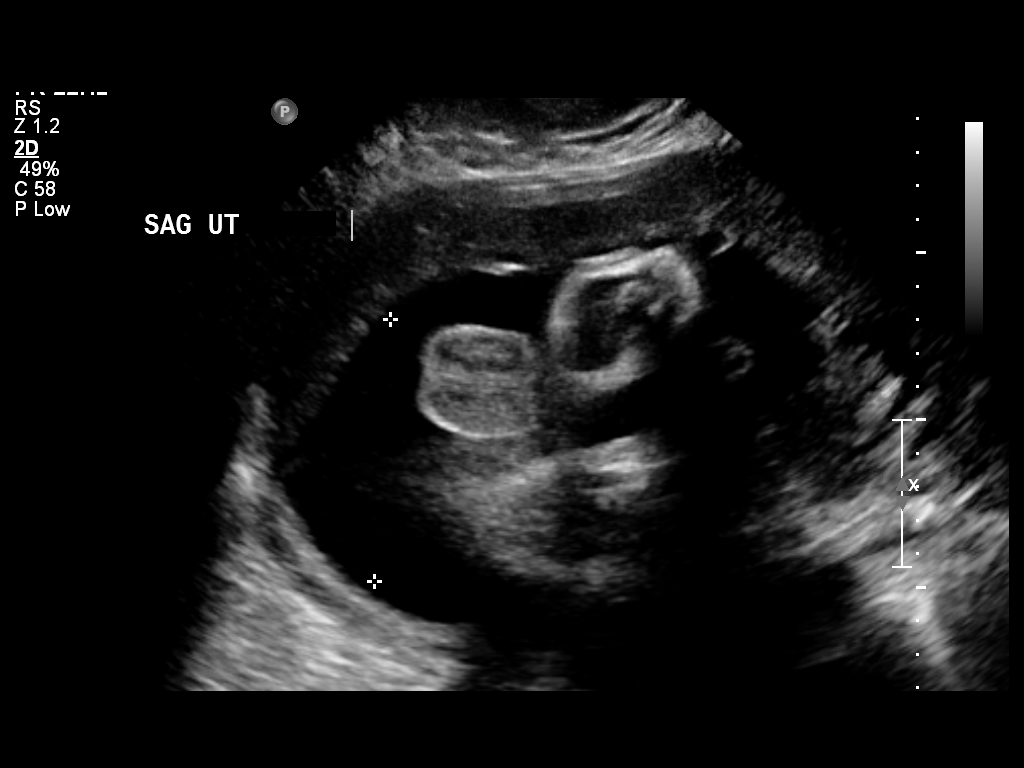

[5 of 5 positions shown; findings below may reference images not displayed]

OBSTETRICS REPORT
                      (Signed Final 11/14/2010 [DATE])

                 Hospital [facility]-
                 Faculty Physician
 Order#:         _O
Procedures

 US FETAL BPP W/O NONSTRESS                            76819.0
Indications

 Non-reactive NST, FHR variables
 Rheumatoid arthritis (followed by rheumatology)
 Obesity
 RH Negative
 History of maternal congenital heart block
 (pacemaker in place, following with cardiology)
 Bipolor disorder (no current meds)
 Asthma
Fetal Evaluation

 Fetal Heart Rate:  153                          bpm
 Cardiac Activity:  Observed
 Presentation:      Cephalic
 Placenta:          Anterior, above cervical os

 Amniotic Fluid
 AFI FV:      Subjectively within normal limits
                                             Larg Pckt:     7.8  cm
Biophysical Evaluation

 Amniotic F.V:   Within normal limits       F. Tone:        Observed
 F. Movement:    Observed                   Score:          [DATE]
 F. Breathing:   Observed
Gestational Age

 Best:          37w 6d     Det. By:  U/S C R L (04/04/10)     EDD:   11/29/10
Impression

 BPP [DATE].

 Subjectively normal amniotic fluid volume with a single pocket
 measurement of > 2 x 2 cm noted.
 questions or concerns.

## 2012-12-07 NOTE — Telephone Encounter (Signed)
Discussed with Dr Ladona Ridgel , he has cleared her for surgery.  Will fax to office

## 2012-12-08 ENCOUNTER — Encounter (HOSPITAL_COMMUNITY)
Admission: RE | Admit: 2012-12-08 | Discharge: 2012-12-08 | Disposition: A | Discharge status: Home / Self Care | Payer: Medicare Other | Source: Ambulatory Visit | Attending: Otolaryngology | Admitting: Otolaryngology

## 2012-12-08 ENCOUNTER — Encounter (HOSPITAL_COMMUNITY): Payer: Self-pay

## 2012-12-08 HISTORY — DX: Carpal tunnel syndrome, right upper limb: G56.01

## 2012-12-08 LAB — BASIC METABOLIC PANEL
BUN: 11 mg/dL (ref 6–23)
CO2: 25 mEq/L (ref 19–32)
Calcium: 9.1 mg/dL (ref 8.4–10.5)
Chloride: 106 mEq/L (ref 96–112)
Creatinine, Ser: 0.79 mg/dL (ref 0.50–1.10)
GFR calc Af Amer: >90 mL/min (ref >90)

## 2012-12-08 LAB — CBC
HCT: 39.5 % (ref 36.0–46.0)
MCH: 27.3 pg (ref 26.0–34.0)
MCV: 85.5 fL (ref 78.0–100.0)
RDW: 14 % (ref 11.5–15.5)
WBC: 4.4 10*3/uL (ref 4.0–10.5)

## 2012-12-09 MED ORDER — CEFAZOLIN SODIUM-DEXTROSE 2-3 GM-% IV SOLR
2.0000 g | INTRAVENOUS | Status: AC
  Administered 2012-12-10: 2 g via INTRAVENOUS
  Filled 2012-12-09: qty 50

## 2012-12-10 ENCOUNTER — Observation Stay (HOSPITAL_COMMUNITY)
Admission: RE | Admit: 2012-12-10 | Discharge: 2012-12-11 | Disposition: A | Discharge status: Home / Self Care | Payer: Medicare Other | Source: Ambulatory Visit | Attending: Otolaryngology | Admitting: Otolaryngology

## 2012-12-10 ENCOUNTER — Encounter (HOSPITAL_COMMUNITY): Payer: Self-pay | Admitting: Surgery

## 2012-12-10 ENCOUNTER — Ambulatory Visit (HOSPITAL_COMMUNITY): Payer: Medicare Other | Admitting: Anesthesiology

## 2012-12-10 ENCOUNTER — Encounter (HOSPITAL_COMMUNITY)
Admission: RE | Disposition: A | Discharge status: Home / Self Care | Payer: Self-pay | Source: Ambulatory Visit | Attending: Otolaryngology

## 2012-12-10 ENCOUNTER — Encounter (HOSPITAL_COMMUNITY): Payer: Self-pay | Admitting: Vascular Surgery

## 2012-12-10 DIAGNOSIS — Q892 Congenital malformations of other endocrine glands: Principal | ICD-10-CM | POA: Insufficient documentation

## 2012-12-10 DIAGNOSIS — Z01812 Encounter for preprocedural laboratory examination: Secondary | ICD-10-CM | POA: Insufficient documentation

## 2012-12-10 HISTORY — PX: THYROGLOSSAL DUCT CYST: SHX297

## 2012-12-10 HISTORY — PX: CYST EXCISION: SHX5701

## 2012-12-10 SURGERY — EXCISION, THYROGLOSSAL DUCT CYST
Anesthesia: General | Site: Neck | Wound class: Clean Contaminated

## 2012-12-10 MED ORDER — ONDANSETRON HCL 4 MG/2ML IJ SOLN: 4.0000 mg | Freq: Once | INTRAMUSCULAR | Status: DC | PRN

## 2012-12-10 MED ORDER — HYDROCODONE-ACETAMINOPHEN 7.5-325 MG PO TABS: 1.0000 | ORAL_TABLET | Freq: Four times a day (QID) | ORAL | Status: DC | PRN

## 2012-12-10 MED ORDER — ACETAMINOPHEN 80 MG PO CHEW
500.0000 mg | CHEWABLE_TABLET | ORAL | Status: DC | PRN
  Filled 2012-12-10: qty 7

## 2012-12-10 MED ORDER — BACITRACIN ZINC 500 UNIT/GM EX OINT
1.0000 "application " | TOPICAL_OINTMENT | Freq: Three times a day (TID) | CUTANEOUS | Status: DC
  Administered 2012-12-10 – 2012-12-11 (×3): 1 via TOPICAL
  Filled 2012-12-10: qty 15

## 2012-12-10 MED ORDER — AMOXICILLIN 500 MG PO CAPS: 500.0000 mg | ORAL_CAPSULE | Freq: Three times a day (TID) | ORAL | Status: DC

## 2012-12-10 MED ORDER — PROMETHAZINE HCL 25 MG RE SUPP: 25.0000 mg | Freq: Four times a day (QID) | RECTAL | Status: DC | PRN

## 2012-12-10 MED ORDER — ONDANSETRON HCL 4 MG/2ML IJ SOLN
INTRAMUSCULAR | Status: DC | PRN
  Administered 2012-12-10: 4 mg via INTRAVENOUS

## 2012-12-10 MED ORDER — SODIUM CHLORIDE 0.9 % IR SOLN
Status: DC | PRN
  Administered 2012-12-10: 1

## 2012-12-10 MED ORDER — CARVEDILOL 3.125 MG PO TABS
3.1250 mg | ORAL_TABLET | Freq: Two times a day (BID) | ORAL | Status: DC
  Administered 2012-12-10 – 2012-12-11 (×2): 3.125 mg via ORAL
  Filled 2012-12-10 (×4): qty 1

## 2012-12-10 MED ORDER — BACITRACIN ZINC 500 UNIT/GM EX OINT
TOPICAL_OINTMENT | CUTANEOUS | Status: DC | PRN
  Administered 2012-12-10: 1 via TOPICAL

## 2012-12-10 MED ORDER — ARTIFICIAL TEARS OP OINT
TOPICAL_OINTMENT | OPHTHALMIC | Status: DC | PRN
  Administered 2012-12-10: 1 via OPHTHALMIC

## 2012-12-10 MED ORDER — PHENOL 1.4 % MT LIQD
1.0000 | Freq: Three times a day (TID) | OROMUCOSAL | Status: DC | PRN
  Filled 2012-12-10: qty 177

## 2012-12-10 MED ORDER — GLYCOPYRROLATE 0.2 MG/ML IJ SOLN
INTRAMUSCULAR | Status: DC | PRN
  Administered 2012-12-10: 0.6 mg via INTRAVENOUS

## 2012-12-10 MED ORDER — MIDAZOLAM HCL 5 MG/5ML IJ SOLN
INTRAMUSCULAR | Status: DC | PRN
  Administered 2012-12-10: 2 mg via INTRAVENOUS

## 2012-12-10 MED ORDER — MORPHINE SULFATE 2 MG/ML IJ SOLN
1.0000 mg | INTRAMUSCULAR | Status: DC | PRN
  Administered 2012-12-10: 1 mg via INTRAVENOUS
  Filled 2012-12-10: qty 1

## 2012-12-10 MED ORDER — DEXTROSE-NACL 5-0.9 % IV SOLN
INTRAVENOUS | Status: DC
  Administered 2012-12-11: 06:00:00 via INTRAVENOUS

## 2012-12-10 MED ORDER — LACTATED RINGERS IV SOLN
INTRAVENOUS | Status: DC
  Administered 2012-12-10: 13:00:00 via INTRAVENOUS

## 2012-12-10 MED ORDER — HYDROCODONE-ACETAMINOPHEN 5-325 MG PO TABS
1.0000 | ORAL_TABLET | ORAL | Status: DC | PRN
  Administered 2012-12-10 – 2012-12-11 (×2): 2 via ORAL
  Filled 2012-12-10 (×2): qty 2

## 2012-12-10 MED ORDER — ROCURONIUM BROMIDE 100 MG/10ML IV SOLN
INTRAVENOUS | Status: DC | PRN
  Administered 2012-12-10: 40 mg via INTRAVENOUS

## 2012-12-10 MED ORDER — NEOSTIGMINE METHYLSULFATE 1 MG/ML IJ SOLN
INTRAMUSCULAR | Status: DC | PRN
  Administered 2012-12-10: 4 mg via INTRAVENOUS

## 2012-12-10 MED ORDER — PROPOFOL 10 MG/ML IV BOLUS
INTRAVENOUS | Status: DC | PRN
  Administered 2012-12-10: 150 mg via INTRAVENOUS

## 2012-12-10 MED ORDER — LOSARTAN POTASSIUM 25 MG PO TABS
25.0000 mg | ORAL_TABLET | Freq: Every day | ORAL | Status: DC
  Administered 2012-12-10 – 2012-12-11 (×2): 25 mg via ORAL
  Filled 2012-12-10 (×2): qty 1

## 2012-12-10 MED ORDER — LIDOCAINE-EPINEPHRINE 1 %-1:100000 IJ SOLN
INTRAMUSCULAR | Status: AC
  Filled 2012-12-10: qty 1

## 2012-12-10 MED ORDER — PROMETHAZINE HCL 25 MG PO TABS: 25.0000 mg | ORAL_TABLET | Freq: Four times a day (QID) | ORAL | Status: DC | PRN

## 2012-12-10 MED ORDER — LIDOCAINE HCL (CARDIAC) 20 MG/ML IV SOLN
INTRAVENOUS | Status: DC | PRN
  Administered 2012-12-10: 30 mg via INTRAVENOUS

## 2012-12-10 MED ORDER — ONDANSETRON HCL 4 MG/2ML IJ SOLN: 4.0000 mg | Freq: Four times a day (QID) | INTRAMUSCULAR | Status: DC | PRN

## 2012-12-10 MED ORDER — FENTANYL CITRATE 0.05 MG/ML IJ SOLN
INTRAMUSCULAR | Status: DC | PRN
  Administered 2012-12-10 (×2): 50 ug via INTRAVENOUS
  Administered 2012-12-10: 100 ug via INTRAVENOUS

## 2012-12-10 MED ORDER — LACTATED RINGERS IV SOLN
INTRAVENOUS | Status: DC | PRN
  Administered 2012-12-10: 13:00:00 via INTRAVENOUS

## 2012-12-10 MED ORDER — AMOXICILLIN 500 MG PO CAPS
500.0000 mg | ORAL_CAPSULE | Freq: Three times a day (TID) | ORAL | Status: DC
  Administered 2012-12-10 – 2012-12-11 (×3): 500 mg via ORAL
  Filled 2012-12-10 (×5): qty 1

## 2012-12-10 MED ORDER — HYDROMORPHONE HCL PF 1 MG/ML IJ SOLN
INTRAMUSCULAR | Status: AC
  Filled 2012-12-10: qty 1

## 2012-12-10 MED ORDER — IBUPROFEN 100 MG/5ML PO SUSP
400.0000 mg | Freq: Four times a day (QID) | ORAL | Status: DC | PRN
  Filled 2012-12-10: qty 20

## 2012-12-10 MED ORDER — HYDROMORPHONE HCL PF 1 MG/ML IJ SOLN
0.2500 mg | INTRAMUSCULAR | Status: DC | PRN
  Administered 2012-12-10: 0.5 mg via INTRAVENOUS
  Administered 2012-12-10 (×2): 0.25 mg via INTRAVENOUS

## 2012-12-10 MED ORDER — BACITRACIN ZINC 500 UNIT/GM EX OINT
TOPICAL_OINTMENT | CUTANEOUS | Status: AC
  Filled 2012-12-10: qty 15

## 2012-12-10 MED ORDER — ACETAMINOPHEN 10 MG/ML IV SOLN: 1000.0000 mg | Freq: Once | INTRAVENOUS | Status: DC | PRN

## 2012-12-10 SURGICAL SUPPLY — 50 items
APPLIER CLIP 9.375 SM OPEN (CLIP)
APR CLP SM 9.3 20 MLT OPN (CLIP)
ATTRACTOMAT 16X20 MAGNETIC DRP (DRAPES) IMPLANT
CANISTER SUCTION 2500CC (MISCELLANEOUS) ×2 IMPLANT
CLEANER TIP ELECTROSURG 2X2 (MISCELLANEOUS) ×2 IMPLANT
CLIP APPLIE 9.375 SM OPEN (CLIP) IMPLANT
CLOTH BEACON ORANGE TIMEOUT ST (SAFETY) ×2 IMPLANT
CONT SPEC 4OZ CLIKSEAL STRL BL (MISCELLANEOUS) ×1 IMPLANT
CORDS BIPOLAR (ELECTRODE) IMPLANT
COVER SURGICAL LIGHT HANDLE (MISCELLANEOUS) ×2 IMPLANT
DRAIN PENROSE 1/4X12 LTX STRL (WOUND CARE) ×1 IMPLANT
ELECT COATED BLADE 2.86 ST (ELECTRODE) ×2 IMPLANT
ELECT PAIRED SUBDERMAL (MISCELLANEOUS)
ELECT REM PT RETURN 9FT ADLT (ELECTROSURGICAL) ×2
ELECTRODE PAIRED SUBDERMAL (MISCELLANEOUS) IMPLANT
ELECTRODE REM PT RTRN 9FT ADLT (ELECTROSURGICAL) ×1 IMPLANT
EVACUATOR SILICONE 100CC (DRAIN) ×2 IMPLANT
GAUZE SPONGE 4X4 16PLY XRAY LF (GAUZE/BANDAGES/DRESSINGS) ×2 IMPLANT
GLOVE BIOGEL PI IND STRL 7.0 (GLOVE) IMPLANT
GLOVE BIOGEL PI INDICATOR 7.0 (GLOVE) ×1
GLOVE ECLIPSE 7.5 STRL STRAW (GLOVE) ×3 IMPLANT
GLOVE SURG SS PI 7.0 STRL IVOR (GLOVE) ×1 IMPLANT
GLOVE SURG SS PI 7.5 STRL IVOR (GLOVE) ×1 IMPLANT
GOWN STRL NON-REIN LRG LVL3 (GOWN DISPOSABLE) ×5 IMPLANT
KIT BASIN OR (CUSTOM PROCEDURE TRAY) ×2 IMPLANT
KIT ROOM TURNOVER OR (KITS) ×2 IMPLANT
NS IRRIG 1000ML POUR BTL (IV SOLUTION) ×2 IMPLANT
PAD ARMBOARD 7.5X6 YLW CONV (MISCELLANEOUS) ×4 IMPLANT
PENCIL FOOT CONTROL (ELECTRODE) ×2 IMPLANT
PROBE NERVBE PRASS .33 (MISCELLANEOUS) IMPLANT
SPECIMEN JAR MEDIUM (MISCELLANEOUS) ×1 IMPLANT
SPECIMEN JAR SMALL (MISCELLANEOUS) ×1 IMPLANT
SPONGE GAUZE 4X4 12PLY (GAUZE/BANDAGES/DRESSINGS) ×1 IMPLANT
SPONGE INTESTINAL PEANUT (DISPOSABLE) IMPLANT
STAPLER VISISTAT 35W (STAPLE) ×2 IMPLANT
SUT CHROMIC 3 0 SH 27 (SUTURE) IMPLANT
SUT CHROMIC 4 0 PS 2 18 (SUTURE) ×4 IMPLANT
SUT ETHILON 5 0 P 3 18 (SUTURE) ×1
SUT NYLON ETHILON 5-0 P-3 1X18 (SUTURE) IMPLANT
SUT SILK 4 0 REEL (SUTURE) ×2 IMPLANT
TAPE SURG TRANSPORE 1 IN (GAUZE/BANDAGES/DRESSINGS) IMPLANT
TAPE SURGICAL TRANSPORE 1 IN (GAUZE/BANDAGES/DRESSINGS) ×1
TOWEL OR 17X24 6PK STRL BLUE (TOWEL DISPOSABLE) ×2 IMPLANT
TOWEL OR 17X26 10 PK STRL BLUE (TOWEL DISPOSABLE) ×2 IMPLANT
TRAY ENT MC OR (CUSTOM PROCEDURE TRAY) ×2 IMPLANT
TUBE ENDOTRAC EMG 7X10.2 (MISCELLANEOUS) IMPLANT
TUBE ENDOTRAC EMG 8X11.3 (MISCELLANEOUS) IMPLANT
TUBE ENDOTRACH  EMG 6MMTUBE EN (MISCELLANEOUS)
TUBE ENDOTRACH EMG 6MMTUBE EN (MISCELLANEOUS) IMPLANT
WATER STERILE IRR 1000ML POUR (IV SOLUTION) ×1 IMPLANT

## 2012-12-10 NOTE — Anesthesia Postprocedure Evaluation (Signed)
  Anesthesia Post-op Note  Patient: Makayla Vasquez  Procedure(s) Performed: Procedure(s) with comments: REVISION OF THYROGLOSSAL DUCT CYST EXCISION (N/A) - Revision of Thyroglossal Duct Cyst Excision  Patient Location: PACU  Anesthesia Type:General  Level of Consciousness: awake  Airway and Oxygen Therapy: Patient Spontanous Breathing  Post-op Pain: mild  Post-op Assessment: Post-op Vital signs reviewed, Patient's Cardiovascular Status Stable, Respiratory Function Stable, Patent Airway, No signs of Nausea or vomiting and Pain level controlled  Post-op Vital Signs: stable  Complications: No apparent anesthesia complications

## 2012-12-10 NOTE — Progress Notes (Signed)
Patient given glasses

## 2012-12-10 NOTE — Transfer of Care (Signed)
Immediate Anesthesia Transfer of Care Note  Patient: Makayla Vasquez  Procedure(s) Performed: Procedure(s) with comments: REVISION OF THYROGLOSSAL DUCT CYST EXCISION (N/A) - Revision of Thyroglossal Duct Cyst Excision  Patient Location: PACU  Anesthesia Type:General  Level of Consciousness: awake, alert  and oriented  Airway & Oxygen Therapy: Patient Spontanous Breathing and Patient connected to face mask oxygen  Post-op Assessment: Report given to PACU RN  Post vital signs: Reviewed and stable  Complications: No apparent anesthesia complications

## 2012-12-10 NOTE — Op Note (Signed)
OPERATIVE REPORT  DATE OF SURGERY: 12/10/2012  PATIENT:  Makayla Vasquez,  32 y.o. female  PRE-OPERATIVE DIAGNOSIS:  Thyroglossal Duct Cyst  POST-OPERATIVE DIAGNOSIS:  Thyroglossal Duct Cyst  PROCEDURE:  Procedure(s): REVISION OF THYROGLOSSAL DUCT CYST EXCISION  SURGEON:  Susy Frizzle, MD  ASSISTANTS: none  ANESTHESIA:   General   EBL:  10 ml  DRAINS: Quarter-inch Penrose  LOCAL MEDICATIONS USED:  None  SPECIMEN:  Thyroglossal sinus  COUNTS:  Correct  PROCEDURE DETAILS: The patient was taken to the operating room and placed on the operating table in the supine position. An ellipse of skin outlining the draining sinus tract was marked with a marking pen with extension bone both sides along skin crease. Electrocautery was used to incise the skin and subcutaneous tissue. The lesion was grasped and dissection continued down along the tract. The dissection continued down above the thyrohyoid membrane all the way to the hyoid bone. The hyoid bone was completely intact. The central portion of the bone was dissected free of surrounding musculature in cut on both sides with bone-cutting rongeurs. The specimen was sent for pathologic evaluation. The wound is irrigated with saline. 4-0 silk ties were used as needed for hemostasis as well as cautery. The drain was left in the center of the wound and secured in place with a nylon suture. The subcutaneous tissue closure was performed using interrupted chromic suture. Interrupted nylon was used on the skin. Bacitracin and a dressing was applied and the patient was awakened, extubated and transferred to recovery in stable condition.    PATIENT DISPOSITION:  To PACU, stable

## 2012-12-10 NOTE — Interval H&P Note (Signed)
History and Physical Interval Note:  12/10/2012 1:25 PM  Makayla Vasquez  has presented today for surgery, with the diagnosis of Thyroglossal duct cyst  The various methods of treatment have been discussed with the patient and family. After consideration of risks, benefits and other options for treatment, the patient has consented to  Procedure(s) with comments: THYROGLOSSAL DUCT CYST (N/A) - Revision of Thyroglossal Duct Cyst Excision as a surgical intervention .  The patient's history has been reviewed, patient examined, no change in status, stable for surgery.  I have reviewed the patient's chart and labs.  Questions were answered to the patient's satisfaction.     Nia Nathaniel

## 2012-12-10 NOTE — Preoperative (Signed)
Beta Blockers   Reason not to administer Beta Blockers:Not Applicable 

## 2012-12-10 NOTE — Progress Notes (Signed)
Medtronic rep called for pacer, pt pacing at 100 %

## 2012-12-10 NOTE — Progress Notes (Signed)
Second call to medtronic rep call returned immediately by Jana Half

## 2012-12-10 NOTE — Progress Notes (Signed)
medtronic tech at  Bedside set pacer to demand

## 2012-12-10 NOTE — Anesthesia Preprocedure Evaluation (Addendum)
Anesthesia Evaluation  Patient identified by MRN, date of birth, ID band Patient awake    Reviewed: Allergy & Precautions, H&P , NPO status , Patient's Chart, lab work & pertinent test results  Airway Mallampati: II      Dental  (+) Teeth Intact and Dental Advisory Given   Pulmonary  breath sounds clear to auscultation        Cardiovascular Rhythm:Regular Rate:Normal     Neuro/Psych    GI/Hepatic   Endo/Other    Renal/GU      Musculoskeletal   Abdominal (+) + obese,   Peds  Hematology   Anesthesia Other Findings   Reproductive/Obstetrics                           Anesthesia Physical Anesthesia Plan  ASA: III  Anesthesia Plan: General   Post-op Pain Management:    Induction: Intravenous  Airway Management Planned: Oral ETT  Additional Equipment:   Intra-op Plan:   Post-operative Plan: Extubation in OR  Informed Consent: I have reviewed the patients History and Physical, chart, labs and discussed the procedure including the risks, benefits and alternatives for the proposed anesthesia with the patient or authorized representative who has indicated his/her understanding and acceptance.   Dental advisory given  Plan Discussed with: CRNA and Surgeon  Anesthesia Plan Comments: (Congenital complete heart block with biventricular pacer now on asynchronous mode at 80 bpm htn Obesity LV dysfunction treated with bivent pacer EF 30-35% by ECHO 5/913 with diffuse hypokinesis Nuclear stress test on 01/07/12 No ischemia. Diffuse hypokinesis with serverely reduced EF LV Ejection Fraction: 29%.   Plan GA with oral ETT  Kipp Brood, MD Kipp Brood, MD .  )       Anesthesia Quick Evaluation

## 2012-12-10 NOTE — Progress Notes (Signed)
Subjective: POD#0 from sistrunck procedure for removal of recurrent thyroglossal duct cyst. Doing well, pain well controlled.  Objective: Vital signs in last 24 hours: Temp:  [97.3 F (36.3 C)-97.9 F (36.6 C)] 97.9 F (36.6 C) (02/21 1701) Pulse Rate:  [62-80] 69 (02/21 1701) Resp:  [16-21] 18 (02/21 1701) BP: (99-133)/(73-85) 99/73 mmHg (02/21 1701) SpO2:  [94 %-100 %] 100 % (02/21 1701)  Neck flat and supple, incision clean dry and intact with dressing and penrose in place and secure. EOMI, PERRLA, CN 2-12 intact and symmetric.  @LABLAST2 (wbc:2,hgb:2,hct:2,plt:2)  Recent Labs  12/08/12 0852  NA 140  K 3.9  CL 106  CO2 25  GLUCOSE 108*  BUN 11  CREATININE 0.79  CALCIUM 9.1    Medications:  Scheduled Meds: . amoxicillin  500 mg Oral Q8H  . bacitracin  1 application Topical Q8H  . carvedilol  3.125 mg Oral BID WC  . HYDROmorphone      . losartan  25 mg Oral Daily   Continuous Infusions: . dextrose 5 % and 0.9% NaCl     PRN Meds:.acetaminophen, HYDROcodone-acetaminophen, ibuprofen, morphine injection, ondansetron (ZOFRAN) IV, phenol, promethazine, promethazine  Assessment/Plan: Doing well POD#0 from sistrunck, will monitor, if doing well may go home in the morning.   LOS: 0 days   Makayla Vasquez 12/10/2012, 5:26 PM

## 2012-12-11 NOTE — Discharge Summary (Signed)
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 Dr. Emeline Darling removed penrose drain before discharge.  Discharge Instructions: Follow up with Dr. Pollyann Kennedy in one week, apply Neosporin to incision BID and clean gently with soap and water or peroxide. Regular diet, up ad lib, no strenuous activity or heavy lifting >25 lbs for one week.  Hospital Course: did well post-op, pain controlled, taking PO, penrose removed POD#1  Melvenia Beam 8:11 AM 12/11/2012

## 2012-12-12 IMAGING — US US OB FOLLOW-UP
1 series · 12 of 28 positions shown · non-contrast
Comparison: none

[Series 1: us ob follow up · 39 acquisitions, 12 frames shown]
[im 2/39]
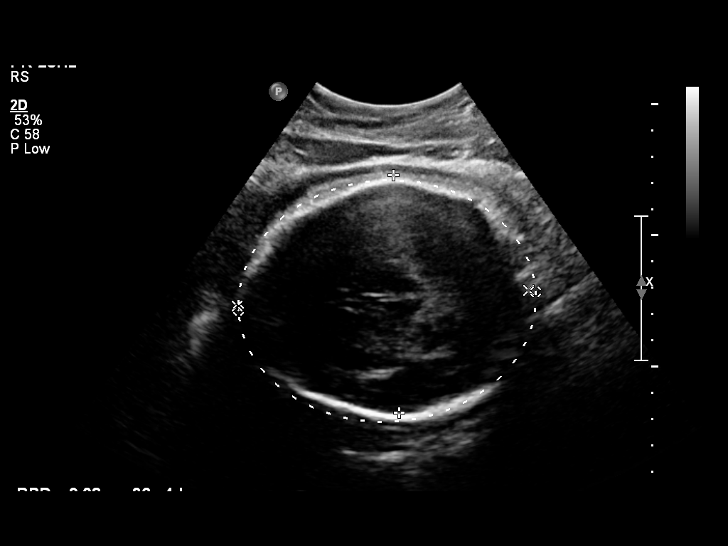
[im 5/39]
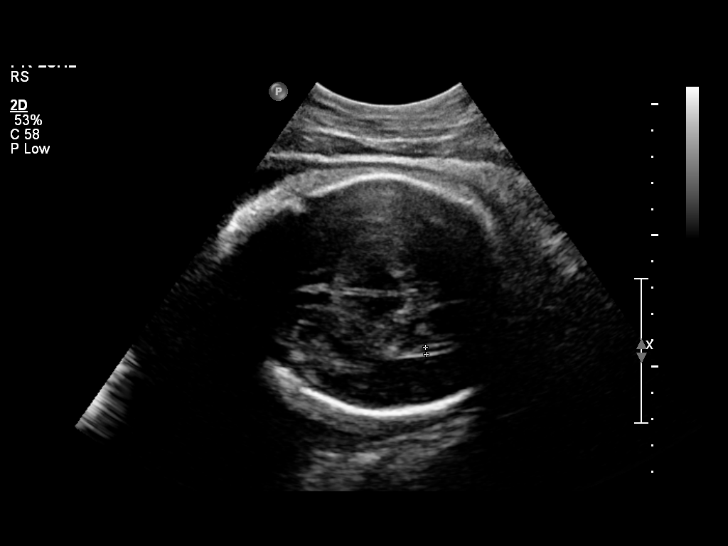
[im 8/39]
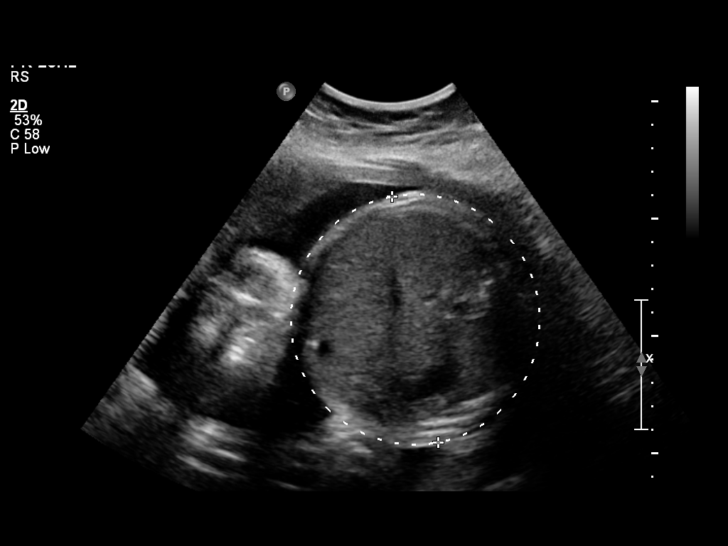
[im 12/39]
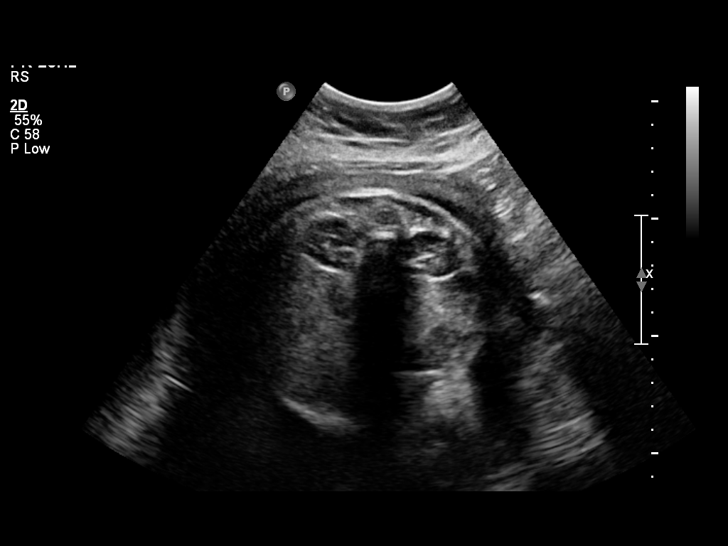
[im 15/39]
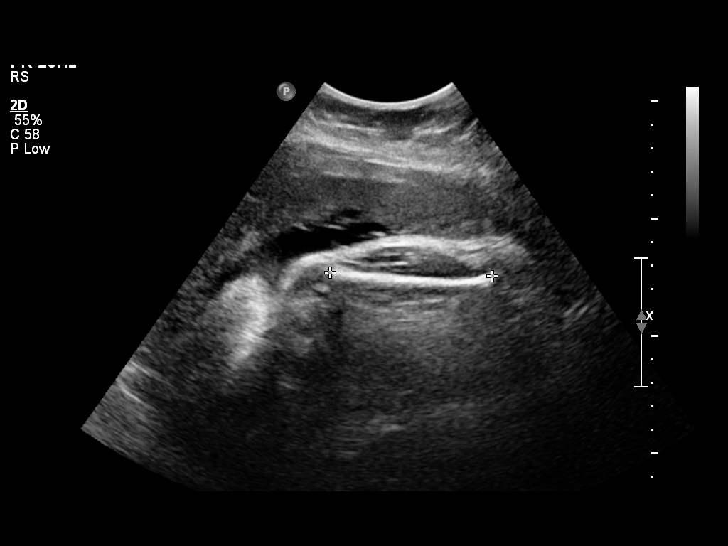
[im 17/39]
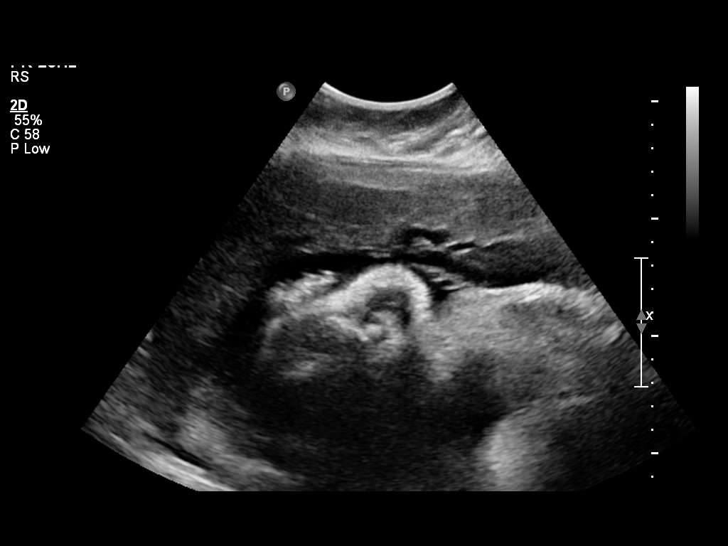
[im 22/39]
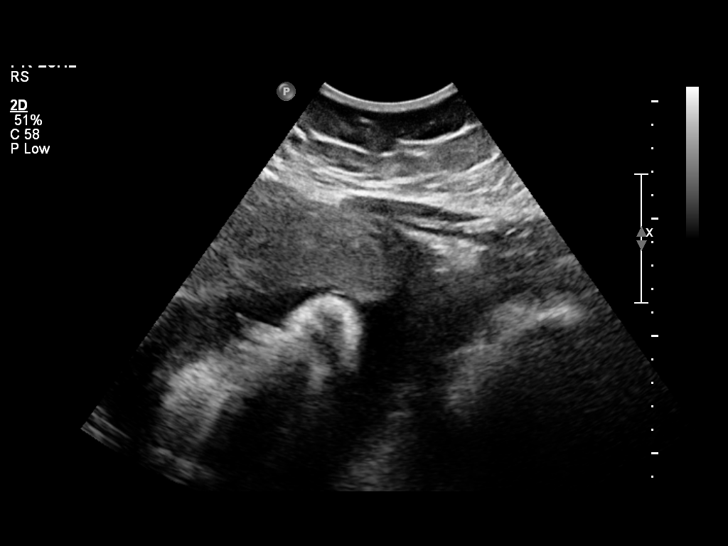
[im 24/39]
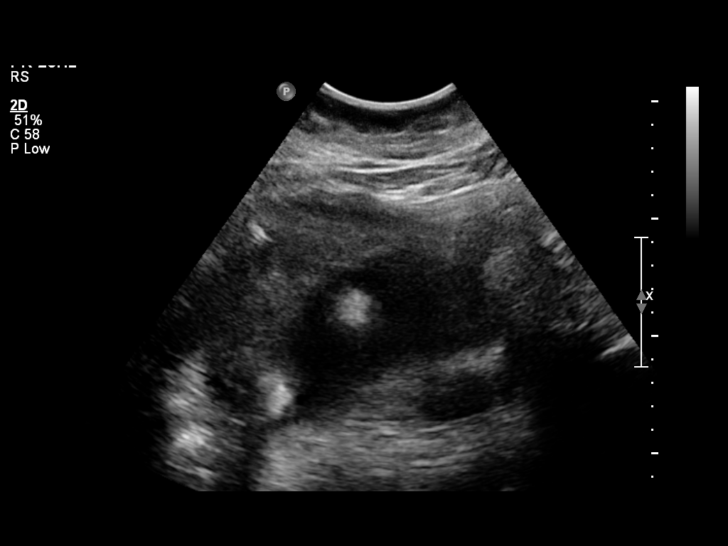
[im 27/39]
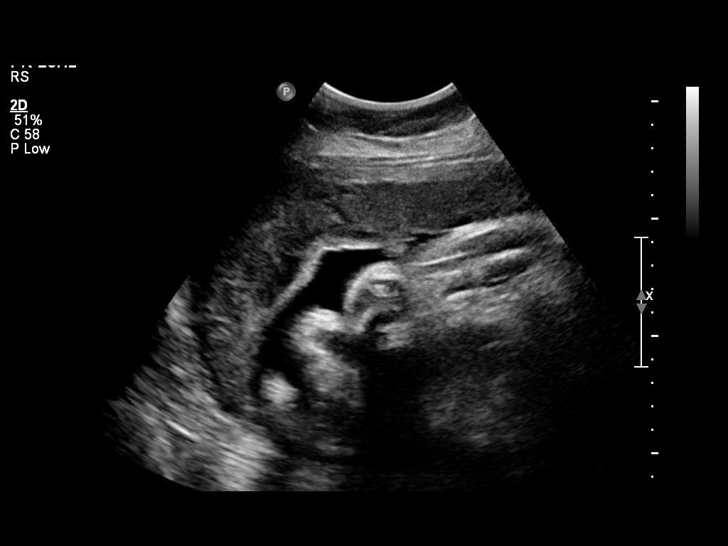
[im 31/39]
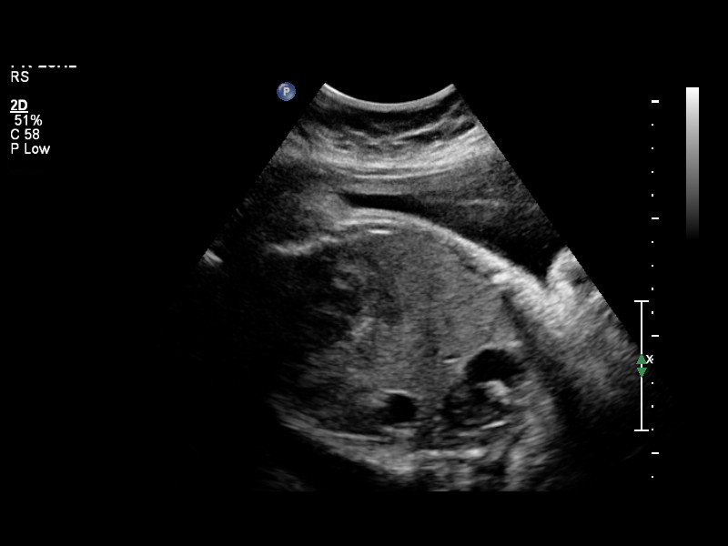
[im 34/39]
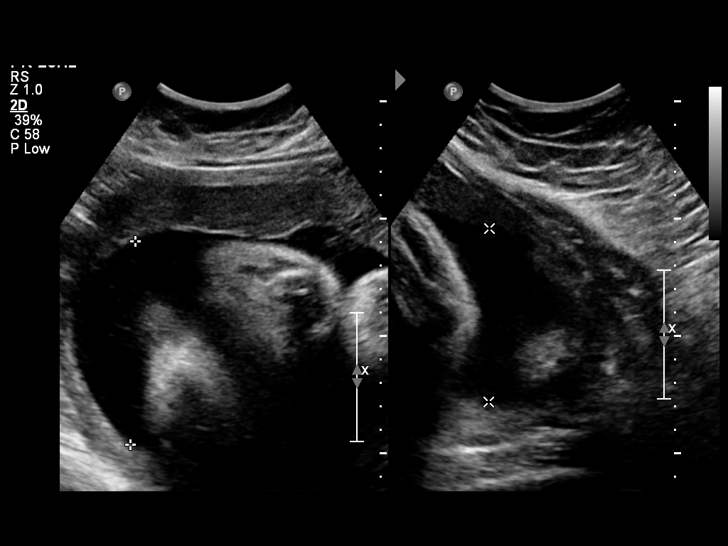
[im 37/39]
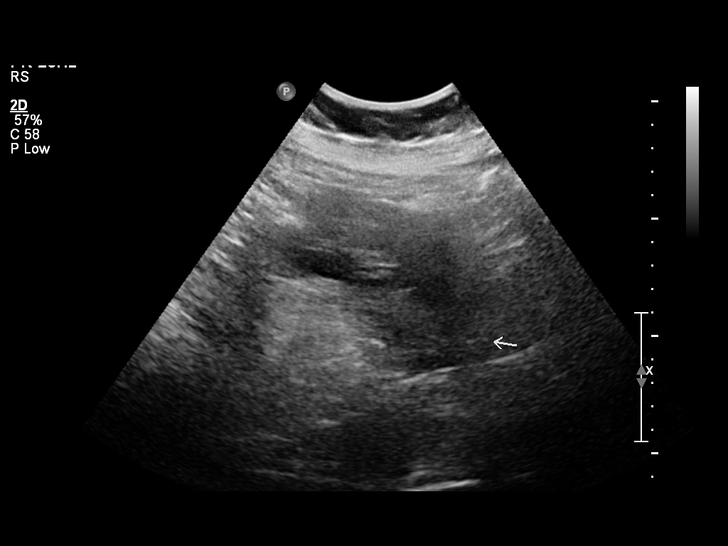

[12 of 28 positions shown; findings below may reference images not displayed]

OBSTETRICS REPORT
                      (Signed Final 11/19/2010 [DATE])

 Order#:         _O
Procedures

 US OB FOLLOW UP                                       76816.1
Indications

 Assess Fetal Growth / Estimated Fetal Weight
 Rheumatoid arthritis (followed by rheumatology)
 Obesity
 RH Negative
 History of maternal congenital heart block
 (pacemaker in place, following with cardiology)
 Bipolor disorder (no current meds)
 Asthma
 Assess amniotic fluid volume
 Size greater than dates (Large for gestational [AGE]
Fetal Evaluation

 Preg. Location:    Intrauterine
 Fetal Heart Rate:  153                          bpm
 Cardiac Activity:  Observed
 Presentation:      Cephalic
 Placenta:          Anterior, above cervical os
 P. Cord            Visualized
 Insertion:

 Amniotic Fluid
 AFI FV:      Subjectively increased
 AFI Sum:     28.12   cm     > 97  %Tile     Larg Pckt:    8.65  cm
 RUQ:   8.65    cm   RLQ:    7.08   cm    LUQ:   5.02    cm   LLQ:    7.37   cm
Biometry

 BPD:     90.4  mm     G. Age:  36w 5d                CI:         81.3   70 - 86
 OFD:    111.2  mm                                    FL/HC:      21.3   20.6 -

 HC:     325.7  mm     G. Age:  37w 0d        8  %    HC/AC:      1.00   0.87 -

 AC:     325.5  mm     G. Age:  36w 3d       16  %    FL/BPD:     76.9   71 - 87
 FL:      69.5  mm     G. Age:  35w 5d        4  %    FL/AC:      21.4   20 - 24

 Est. FW:    5939  gm      6 lb 7 oz     31  %
Gestational Age

 U/S Today:     36w 3d                                        EDD:   12/14/10
 Best:          38w 4d     Det. By:  U/S C R L   (04/04/10)   EDD:   11/29/10
Anatomy

 Cranium:           Appears normal      Aortic Arch:       Previously seen
 Fetal Cavum:       Previously seen     Ductal Arch:       Previously seen
 Ventricles:        Appears normal      Diaphragm:         Appears normal
 Choroid Plexus:    Previously seen     Stomach:           Appears
                                                           normal, left
                                                           sided
 Cerebellum:        Previously seen     Abdomen:           Appears normal
 Posterior Fossa:   Previously seen     Abdominal Wall:    Previously seen
 Nuchal Fold:       Previously seen     Cord Vessels:      Previously seen
 Face:              Previously seen     Kidneys:           Appear normal
 Heart:             Not well            Bladder:           Appears normal
                    visualized
                    today. Seen
                    previously.
 RVOT:              Previously seen     Spine:             Previously seen
 LVOT:              Previously seen     Limbs:             Previously seen

 Other:     Male gender seen. Heels & 5th previously visualized.
            Technically difficult due to maternal habitus and fetal
            position.
Cervix Uterus Adnexa

 Cervix:       Not visualized (advanced GA >34 wks)
 Uterus:       No abnormality visualized.
 Cul De Sac:   No free fluid seen.

 Left Ovary:    No adnexal mass visualized.
 Right Ovary:   No adnexal mass visualized.

 Adnexa:     No abnormality visualized.
Comments

 Polyhydramnios.

 Concordant growth since 10/22/10 with EFW today at 31st
 percentile. Fetal indices are concordant.

 questions or concerns.

## 2012-12-13 ENCOUNTER — Encounter (HOSPITAL_COMMUNITY): Payer: Self-pay | Admitting: Otolaryngology

## 2013-02-24 ENCOUNTER — Emergency Department (HOSPITAL_COMMUNITY)
Admission: EM | Admit: 2013-02-24 | Discharge: 2013-02-24 | Disposition: A | Discharge status: Home / Self Care | Payer: Medicare Other | Attending: Emergency Medicine | Admitting: Emergency Medicine

## 2013-02-24 ENCOUNTER — Encounter (HOSPITAL_COMMUNITY): Payer: Self-pay | Admitting: *Deleted

## 2013-02-24 ENCOUNTER — Emergency Department (HOSPITAL_COMMUNITY): Payer: Medicare Other

## 2013-02-24 DIAGNOSIS — Z8719 Personal history of other diseases of the digestive system: Secondary | ICD-10-CM | POA: Insufficient documentation

## 2013-02-24 DIAGNOSIS — Z8669 Personal history of other diseases of the nervous system and sense organs: Secondary | ICD-10-CM | POA: Insufficient documentation

## 2013-02-24 DIAGNOSIS — Y92410 Unspecified street and highway as the place of occurrence of the external cause: Secondary | ICD-10-CM | POA: Insufficient documentation

## 2013-02-24 DIAGNOSIS — Z862 Personal history of diseases of the blood and blood-forming organs and certain disorders involving the immune mechanism: Secondary | ICD-10-CM | POA: Insufficient documentation

## 2013-02-24 DIAGNOSIS — R0789 Other chest pain: Secondary | ICD-10-CM

## 2013-02-24 DIAGNOSIS — I10 Essential (primary) hypertension: Secondary | ICD-10-CM | POA: Insufficient documentation

## 2013-02-24 DIAGNOSIS — Z79899 Other long term (current) drug therapy: Secondary | ICD-10-CM | POA: Insufficient documentation

## 2013-02-24 DIAGNOSIS — Y9389 Activity, other specified: Secondary | ICD-10-CM | POA: Insufficient documentation

## 2013-02-24 DIAGNOSIS — Z950 Presence of cardiac pacemaker: Secondary | ICD-10-CM | POA: Insufficient documentation

## 2013-02-24 DIAGNOSIS — R011 Cardiac murmur, unspecified: Secondary | ICD-10-CM | POA: Insufficient documentation

## 2013-02-24 DIAGNOSIS — S298XXA Other specified injuries of thorax, initial encounter: Secondary | ICD-10-CM | POA: Insufficient documentation

## 2013-02-24 DIAGNOSIS — K219 Gastro-esophageal reflux disease without esophagitis: Secondary | ICD-10-CM | POA: Insufficient documentation

## 2013-02-24 DIAGNOSIS — Z87891 Personal history of nicotine dependence: Secondary | ICD-10-CM | POA: Insufficient documentation

## 2013-02-24 DIAGNOSIS — F319 Bipolar disorder, unspecified: Secondary | ICD-10-CM | POA: Insufficient documentation

## 2013-02-24 DIAGNOSIS — F411 Generalized anxiety disorder: Secondary | ICD-10-CM | POA: Insufficient documentation

## 2013-02-24 DIAGNOSIS — E669 Obesity, unspecified: Secondary | ICD-10-CM | POA: Insufficient documentation

## 2013-02-24 DIAGNOSIS — Z8679 Personal history of other diseases of the circulatory system: Secondary | ICD-10-CM | POA: Insufficient documentation

## 2013-02-24 LAB — POCT I-STAT TROPONIN I
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
## 2013-02-25 ENCOUNTER — Emergency Department (HOSPITAL_COMMUNITY): Payer: No Typology Code available for payment source

## 2013-02-25 ENCOUNTER — Emergency Department (HOSPITAL_COMMUNITY)
Admission: EM | Admit: 2013-02-25 | Discharge: 2013-02-25 | Disposition: A | Discharge status: Home / Self Care | Payer: No Typology Code available for payment source | Attending: Emergency Medicine | Admitting: Emergency Medicine

## 2013-02-25 ENCOUNTER — Encounter (HOSPITAL_COMMUNITY): Payer: Self-pay | Admitting: Emergency Medicine

## 2013-02-25 DIAGNOSIS — Z3202 Encounter for pregnancy test, result negative: Secondary | ICD-10-CM | POA: Insufficient documentation

## 2013-02-25 DIAGNOSIS — Z8709 Personal history of other diseases of the respiratory system: Secondary | ICD-10-CM | POA: Insufficient documentation

## 2013-02-25 DIAGNOSIS — E669 Obesity, unspecified: Secondary | ICD-10-CM | POA: Insufficient documentation

## 2013-02-25 DIAGNOSIS — I509 Heart failure, unspecified: Secondary | ICD-10-CM | POA: Insufficient documentation

## 2013-02-25 DIAGNOSIS — Z8659 Personal history of other mental and behavioral disorders: Secondary | ICD-10-CM | POA: Insufficient documentation

## 2013-02-25 DIAGNOSIS — Z8719 Personal history of other diseases of the digestive system: Secondary | ICD-10-CM | POA: Insufficient documentation

## 2013-02-25 DIAGNOSIS — R109 Unspecified abdominal pain: Secondary | ICD-10-CM

## 2013-02-25 DIAGNOSIS — Z862 Personal history of diseases of the blood and blood-forming organs and certain disorders involving the immune mechanism: Secondary | ICD-10-CM | POA: Insufficient documentation

## 2013-02-25 DIAGNOSIS — Z87891 Personal history of nicotine dependence: Secondary | ICD-10-CM | POA: Insufficient documentation

## 2013-02-25 DIAGNOSIS — Y9389 Activity, other specified: Secondary | ICD-10-CM | POA: Insufficient documentation

## 2013-02-25 DIAGNOSIS — Z79899 Other long term (current) drug therapy: Secondary | ICD-10-CM | POA: Insufficient documentation

## 2013-02-25 DIAGNOSIS — Z8679 Personal history of other diseases of the circulatory system: Secondary | ICD-10-CM | POA: Insufficient documentation

## 2013-02-25 DIAGNOSIS — S139XXA Sprain of joints and ligaments of unspecified parts of neck, initial encounter: Secondary | ICD-10-CM | POA: Insufficient documentation

## 2013-02-25 DIAGNOSIS — Z8669 Personal history of other diseases of the nervous system and sense organs: Secondary | ICD-10-CM | POA: Insufficient documentation

## 2013-02-25 DIAGNOSIS — S298XXA Other specified injuries of thorax, initial encounter: Secondary | ICD-10-CM | POA: Insufficient documentation

## 2013-02-25 DIAGNOSIS — S161XXA Strain of muscle, fascia and tendon at neck level, initial encounter: Secondary | ICD-10-CM

## 2013-02-25 DIAGNOSIS — S0990XA Unspecified injury of head, initial encounter: Secondary | ICD-10-CM | POA: Insufficient documentation

## 2013-02-25 DIAGNOSIS — Y92410 Unspecified street and highway as the place of occurrence of the external cause: Secondary | ICD-10-CM | POA: Insufficient documentation

## 2013-02-25 DIAGNOSIS — J45909 Unspecified asthma, uncomplicated: Secondary | ICD-10-CM | POA: Insufficient documentation

## 2013-02-25 DIAGNOSIS — Z950 Presence of cardiac pacemaker: Secondary | ICD-10-CM | POA: Insufficient documentation

## 2013-02-25 DIAGNOSIS — S3981XA Other specified injuries of abdomen, initial encounter: Secondary | ICD-10-CM | POA: Insufficient documentation

## 2013-02-25 DIAGNOSIS — I10 Essential (primary) hypertension: Secondary | ICD-10-CM | POA: Insufficient documentation

## 2013-02-25 HISTORY — DX: Unspecified asthma, uncomplicated: J45.909

## 2013-02-25 HISTORY — DX: Heart failure, unspecified: I50.9

## 2013-02-25 LAB — CBC
HCT: 41.4 % (ref 36.0–46.0)
Hemoglobin: 13.7 g/dL (ref 12.0–15.0)
MCH: 28.1 pg (ref 26.0–34.0)
MCHC: 33.1 g/dL (ref 30.0–36.0)

## 2013-02-25 LAB — URINALYSIS, ROUTINE W REFLEX MICROSCOPIC
Bilirubin Urine: NEGATIVE
Glucose, UA: NEGATIVE mg/dL
Hgb urine dipstick: NEGATIVE
Specific Gravity, Urine: 1.028 (ref 1.005–1.030)
Urobilinogen, UA: 1 mg/dL (ref 0.0–1.0)
pH: 5.5 (ref 5.0–8.0)

## 2013-02-25 LAB — COMPREHENSIVE METABOLIC PANEL
Alkaline Phosphatase: 72 U/L (ref 39–117)
BUN: 12 mg/dL (ref 6–23)
CO2: 27 mEq/L (ref 19–32)
GFR calc Af Amer: >90 mL/min (ref >90)
GFR calc non Af Amer: 85 mL/min — ABNORMAL LOW (ref >90)
Glucose, Bld: 87 mg/dL (ref 70–99)
Potassium: 3.8 mEq/L (ref 3.5–5.1)
Total Protein: 8.7 g/dL — ABNORMAL HIGH (ref 6.0–8.3)

## 2013-02-25 LAB — URINE MICROSCOPIC-ADD ON

## 2013-02-25 LAB — LIPASE, BLOOD: Lipase: 21 U/L (ref 11–59)

## 2013-02-25 LAB — TYPE AND SCREEN

## 2013-02-25 MED ORDER — HYDROCODONE-ACETAMINOPHEN 5-325 MG PO TABS: 1.0000 | ORAL_TABLET | Freq: Four times a day (QID) | ORAL | Status: DC | PRN

## 2013-02-25 MED ORDER — IOHEXOL 300 MG/ML  SOLN
100.0000 mL | Freq: Once | INTRAMUSCULAR | Status: AC | PRN
  Administered 2013-02-25: 100 mL via INTRAVENOUS

## 2013-02-25 MED ORDER — METHOCARBAMOL 750 MG PO TABS: 750.0000 mg | ORAL_TABLET | Freq: Four times a day (QID) | ORAL | Status: DC | PRN

## 2013-02-25 MED ORDER — NAPROXEN 500 MG PO TABS: 500.0000 mg | ORAL_TABLET | Freq: Two times a day (BID) | ORAL | Status: DC | PRN
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
## 2013-02-26 LAB — URINE CULTURE: Colony Count: 40000

## 2013-02-26 LAB — ABO/RH: ABO/RH(D): O NEG
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
## 2013-03-11 ENCOUNTER — Ambulatory Visit: Payer: Medicare Other | Admitting: Family Medicine

## 2013-03-23 ENCOUNTER — Ambulatory Visit (INDEPENDENT_AMBULATORY_CARE_PROVIDER_SITE_OTHER): Payer: Medicare Other | Admitting: *Deleted

## 2013-03-23 DIAGNOSIS — I5022 Chronic systolic (congestive) heart failure: Secondary | ICD-10-CM

## 2013-03-23 DIAGNOSIS — I499 Cardiac arrhythmia, unspecified: Secondary | ICD-10-CM

## 2013-03-23 DIAGNOSIS — Q246 Congenital heart block: Secondary | ICD-10-CM

## 2013-03-23 LAB — PACEMAKER DEVICE OBSERVATION
AL AMPLITUDE: 4.625 mv
AL IMPEDENCE PM: 380 Ohm
BATTERY VOLTAGE: 3.0023 V
RV LEAD AMPLITUDE: 4.625 mv
RV LEAD IMPEDENCE PM: 266 Ohm

## 2013-03-23 NOTE — Progress Notes (Signed)
PPM check with ICM in office. 

## 2013-03-30 ENCOUNTER — Encounter: Payer: Self-pay | Admitting: Emergency Medicine

## 2013-03-30 ENCOUNTER — Ambulatory Visit (INDEPENDENT_AMBULATORY_CARE_PROVIDER_SITE_OTHER): Payer: Medicare Other | Admitting: Emergency Medicine

## 2013-03-30 VITALS — BP 114/84 | HR 80 | Ht 66.0 in | Wt 231.0 lb

## 2013-03-30 DIAGNOSIS — F319 Bipolar disorder, unspecified: Secondary | ICD-10-CM

## 2013-03-30 NOTE — Progress Notes (Signed)
  Subjective:    Patient ID: Makayla Vasquez, female    DOB: 1981/04/23, 32 y.o.   MRN: [id number]  HPI Makayla Vasquez is here for a letter.  She states that she needs a letter from her doctor so that her dog (who she uses as a companion dog for mood disorder) can stay in her new apartment.  She moved about 2 months ago.  Currently, dog is living at her mother's house.  She states that about 4 years ago she was being seen here and at mental health for depression.  She was on zoloft, depakote, and abilify.  Once she got her dog (4 years ago) she was able to stop all her medications.  She reports that her mood is stable now.  Does have some problems with sleep and energy that she attributes to working and having a 2 year child.  No SI/HI PHQ-9: 6 (3 for sleep and energy), not at all difficult  I have reviewed and updated the following as appropriate: allergies and current medications SHx: former smoker  Review of Systems See HPI    Objective:   Physical Exam BP 114/84  Pulse 80  Ht 5\' 6"  (1.676 m)  Wt 231 lb (104.781 kg)  BMI 37.3 kg/m2 Gen: alert, cooperative, NAD HEENT: AT/[place], sclera white, MMM Neck: supple CV: RRR, no murmurs Pulm: CTAB, no wheezes or rales      Assessment & Plan:   Follow up in 2-3 months for complete physical exam.

## 2013-03-30 NOTE — Assessment & Plan Note (Signed)
Provided letter stating she needs her dog for mood stabilization.  Review of chart shows some inconsistencies in her story.  However, PHQ-9 is not concerning today and her dog does appear to be helping with her mood.

## 2013-03-30 NOTE — Patient Instructions (Addendum)
It was nice to see you! I have provided a letter for your land lord regarding your dog.  Please make an appointment in the next 2-3 months for an annual physical exam.

## 2013-04-16 IMAGING — US US PELVIS COMPLETE
1 series · 14 of 25 positions shown · non-contrast
Comparison: None.

CLINICAL DATA: Pelvic pain.



[Series 1: us pelvis complete · 0.26mm/px · 14 of 46 slices shown]
[im 1/46]
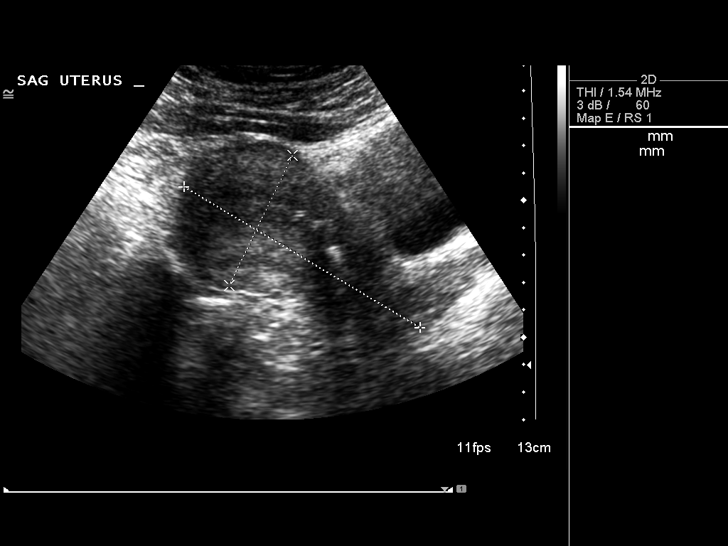
[im 4/46]
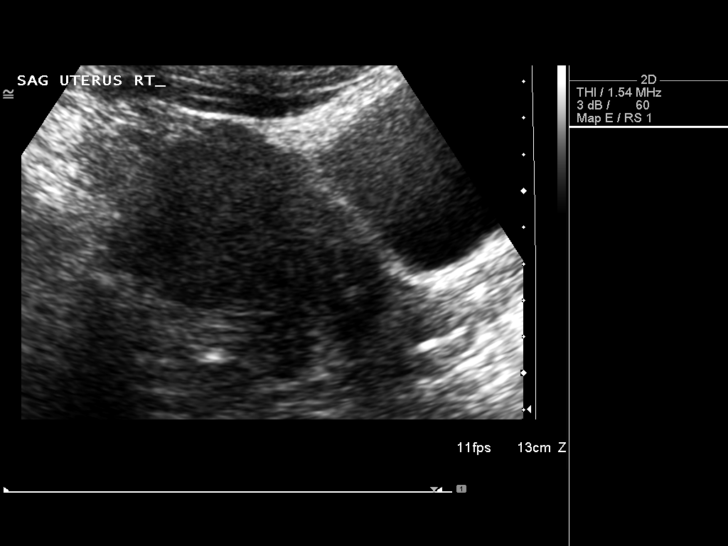
[im 8/46]
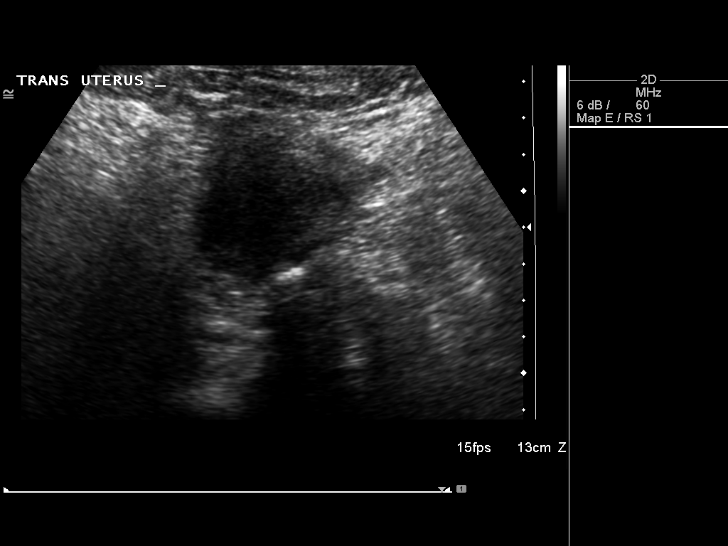
[im 12/46]
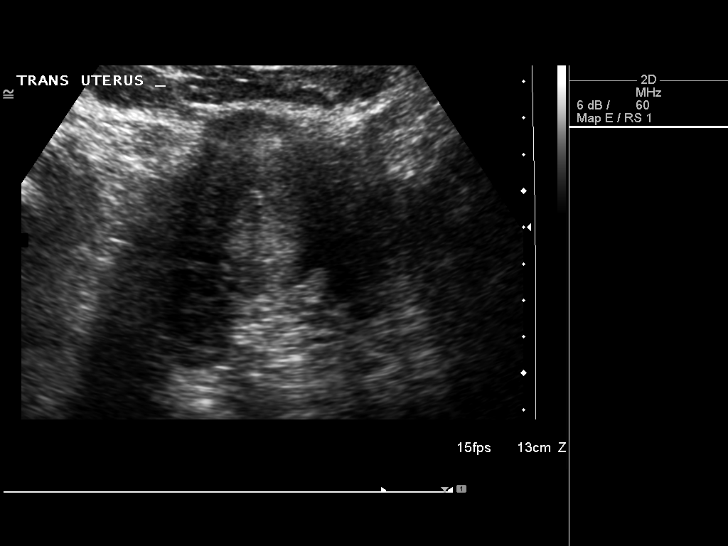
[im 16/46]
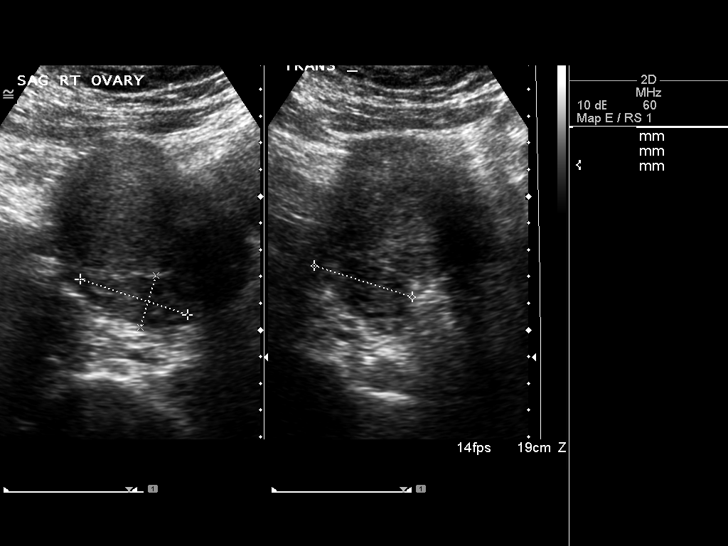
[im 17/46]
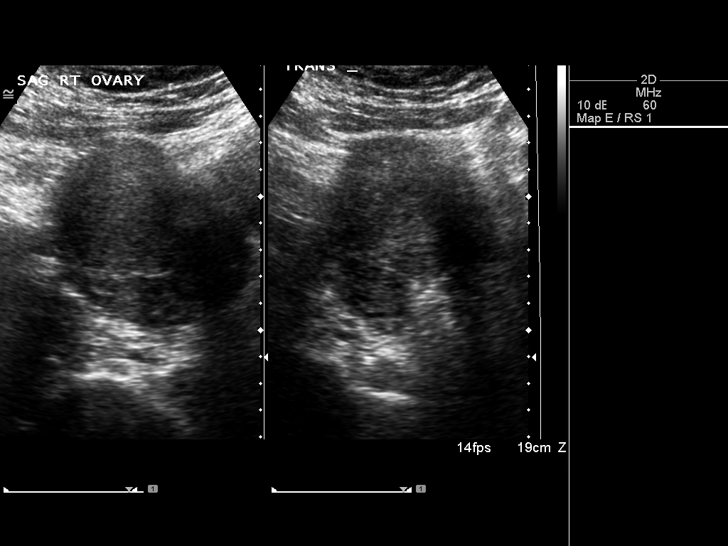
[im 21/46]
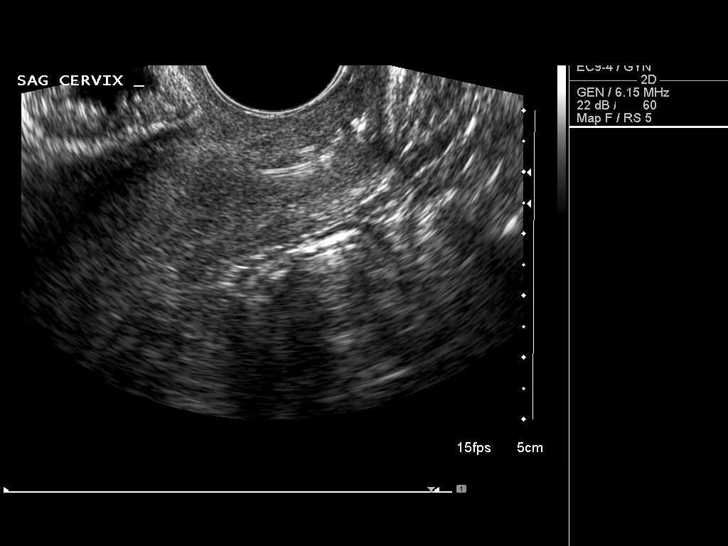
[im 25/46]
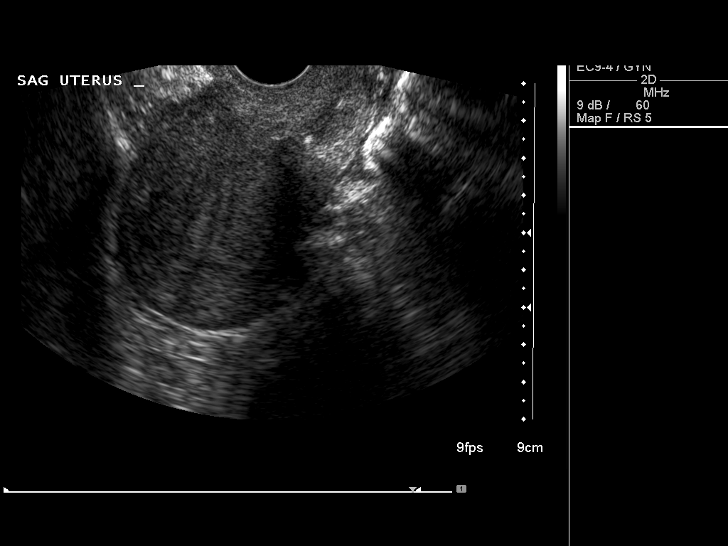
[im 29/46]
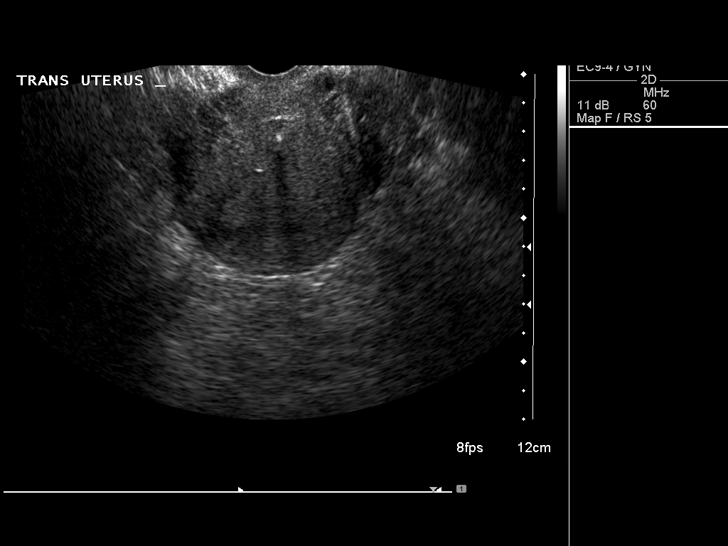
[im 31/46]
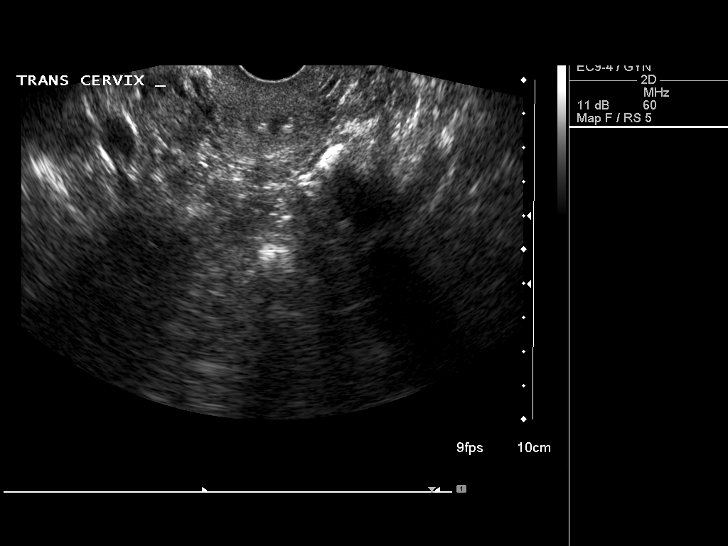
[im 34/46]
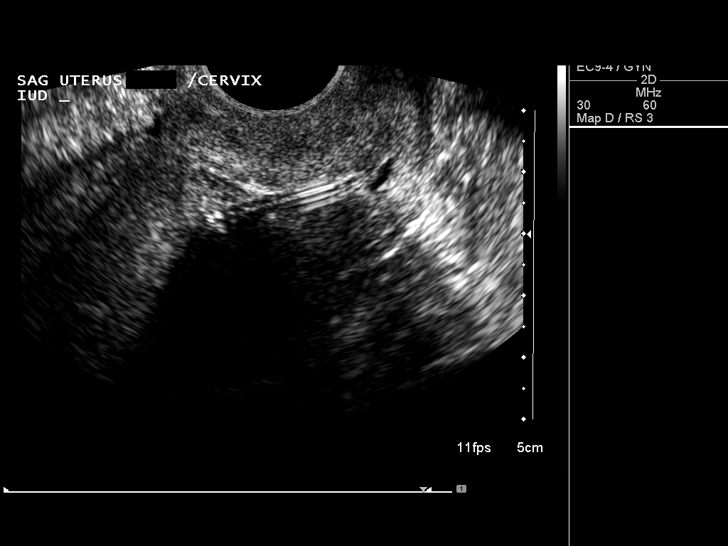
[im 38/46]
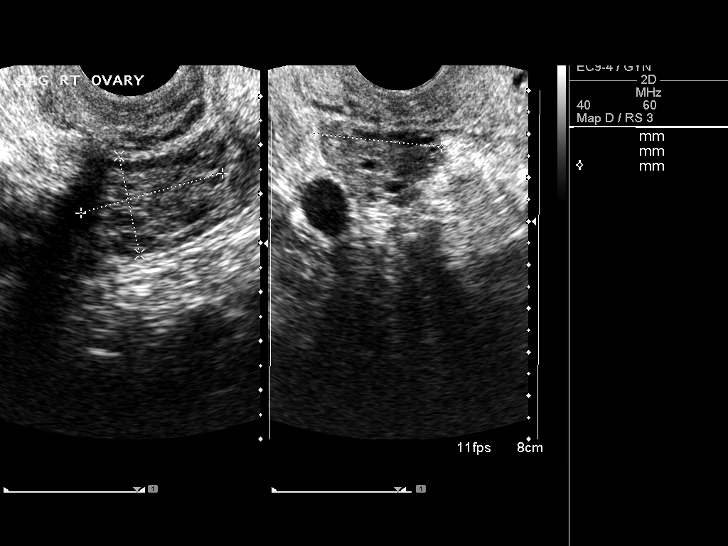
[im 42/46]
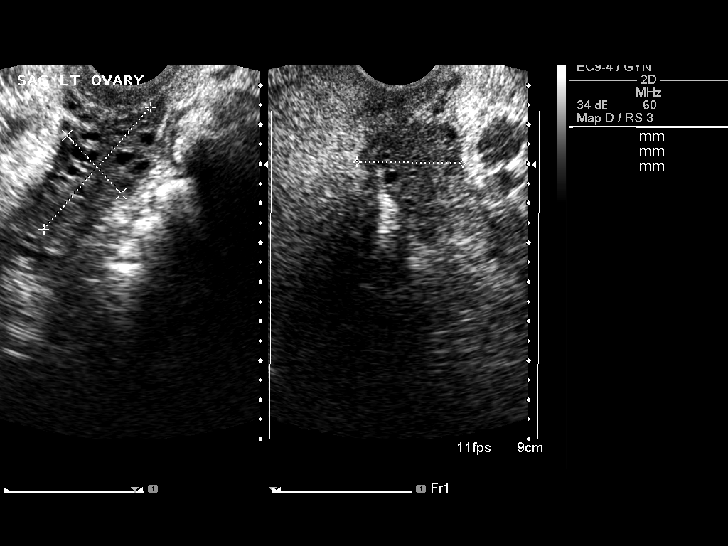
[im 46/46]
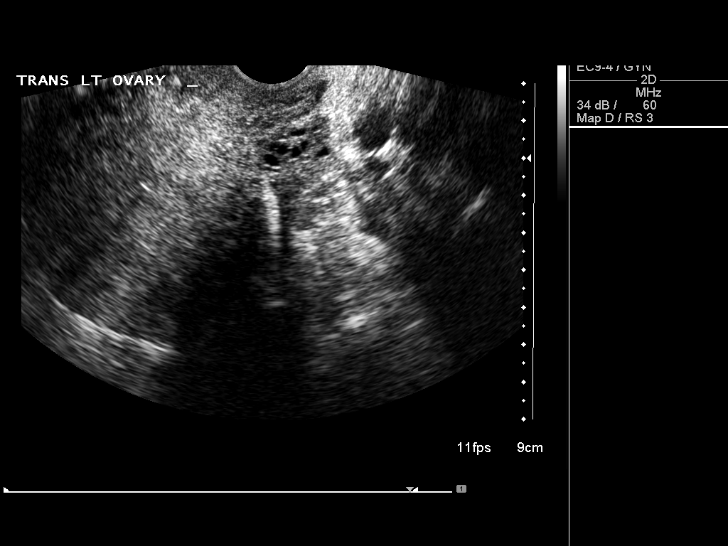

[14 of 25 positions shown; findings below may reference images not displayed]

FINDINGS: Uterus:  Measures 9.1 x 5.2 x 6.3 cm.  No fibroids or other uterine
masses identified.

Endometrium:  Measures 7-9 mm.  Intrauterine contraceptive device
is located within the lower uterine segment and cervix.

Right ovary:  Measures 3.0 x 2.1 x 2.9 cm.  Normal appearance/no
adnexal mass.

Left ovary:  Measures 4.1 x 2.0 x 2.7 cm.  Normal appearance/no
adnexal mass.

Other findings:  No free fluid.
IMPRESSION: Intrauterine contraceptive device appears low-lying in the lower
uterine segment and cervix.

## 2013-04-20 ENCOUNTER — Ambulatory Visit (INDEPENDENT_AMBULATORY_CARE_PROVIDER_SITE_OTHER): Payer: Medicare Other | Admitting: Emergency Medicine

## 2013-04-20 ENCOUNTER — Encounter: Payer: Self-pay | Admitting: Emergency Medicine

## 2013-04-20 VITALS — BP 120/80 | HR 76 | Ht 66.0 in | Wt 230.0 lb

## 2013-04-20 DIAGNOSIS — G5600 Carpal tunnel syndrome, unspecified upper limb: Secondary | ICD-10-CM

## 2013-04-20 DIAGNOSIS — R350 Frequency of micturition: Secondary | ICD-10-CM

## 2013-04-20 DIAGNOSIS — G5601 Carpal tunnel syndrome, right upper limb: Secondary | ICD-10-CM

## 2013-04-20 LAB — POCT URINALYSIS DIPSTICK
Bilirubin, UA: NEGATIVE
Glucose, UA: NEGATIVE
Ketones, UA: NEGATIVE
Spec Grav, UA: 1.025
Urobilinogen, UA: 1

## 2013-04-20 MED ORDER — MELOXICAM 7.5 MG PO TABS: 7.5000 mg | ORAL_TABLET | Freq: Every day | ORAL | Status: DC

## 2013-04-20 NOTE — Assessment & Plan Note (Addendum)
On the right. Meloxicam 7.5mg  daily x1 week, then prn. Wrist brace given to wear with work and at night x1 week, then as needed. Discussed range of motion exercises at least TID. Follow up in 6 weeks.

## 2013-04-20 NOTE — Assessment & Plan Note (Addendum)
No dysuria to indicate infection. Recent CMP has normal glucose.  A1c in 02/2012 was 5.9. Will check UA - normal.

## 2013-04-20 NOTE — Patient Instructions (Addendum)
It was nice to see you! I think you have carpal tunnel in your right hand. Please take meloxicam 1 tablet daily for the next week, then as needed. DO NOT TAKE NAPROSYN, ALEVE, IBUPROFEN, OR MOTRIN WITH THIS MEDICATION. Wear the wrist brace when you are at work and at night for the next week, then only when that numbness comes back. Please make sure you take the brace off several times a day and move the wrist around. We checked your urine and it is normal. Follow up in 6 weeks to see how your hand is doing.

## 2013-04-20 NOTE — Progress Notes (Signed)
  Subjective:    Patient ID: Makayla Vasquez, female    DOB: Jun 18, 1981, 32 y.o.   MRN: [id number]  HPI Makayla Vasquez is here for right hand numbness and urinary frequency.  1. Right hand numbness: She states that for the last week her right hand will go numb.  Sometimes it is just the medial part of the hand, sometimes it is the whole hand.  Typically with last for a few minutes, but she can "shake it out."  Worse after repetitive movements or holding things.  Has dropped stuff secondary to the numbness.  Denies weakness in the hand.  Works as a Lawyer.  Does also describe some discomfort in the upper right arm when her child lays on it.  2. Urinary frequency: Over the last few weeks.  No dysuria.  Denies polydipsia.    I have reviewed and updated the following as appropriate: allergies and current medications SHx: former smoker   Review of Systems See HPI    Objective:   Physical Exam BP 120/80  Pulse 76  Ht 5\' 6"  (1.676 m)  Wt 230 lb (104.327 kg)  BMI 37.14 kg/m2 Gen: alert, cooperative, NAD, shakes right hand frequently Right hand:  no erythema or edema; no atrophy Grip and interosseus strength 5/5; wrist extension and flexion 5/5; biceps 5/5 + Tinel's on right     Assessment & Plan:

## 2013-05-12 ENCOUNTER — Encounter: Payer: Self-pay | Admitting: Internal Medicine

## 2013-05-26 ENCOUNTER — Encounter (HOSPITAL_COMMUNITY): Payer: Self-pay | Admitting: *Deleted

## 2013-05-26 ENCOUNTER — Emergency Department (INDEPENDENT_AMBULATORY_CARE_PROVIDER_SITE_OTHER)
Admission: EM | Admit: 2013-05-26 | Discharge: 2013-05-26 | Disposition: A | Discharge status: Home / Self Care | Payer: Medicare Other | Source: Home / Self Care | Attending: Family Medicine | Admitting: Family Medicine

## 2013-05-26 DIAGNOSIS — J329 Chronic sinusitis, unspecified: Secondary | ICD-10-CM

## 2013-05-26 MED ORDER — FLUTICASONE PROPIONATE 50 MCG/ACT NA SUSP: 2.0000 | Freq: Every day | NASAL | Status: DC

## 2013-05-26 MED ORDER — AMOXICILLIN 500 MG PO CAPS: 500.0000 mg | ORAL_CAPSULE | Freq: Three times a day (TID) | ORAL | Status: DC

## 2013-05-26 MED ORDER — PREDNISONE 20 MG PO TABS: ORAL_TABLET | ORAL | Status: DC

## 2013-05-26 MED ORDER — BENZONATATE 100 MG PO CAPS: 100.0000 mg | ORAL_CAPSULE | Freq: Three times a day (TID) | ORAL | Status: DC

## 2013-05-26 NOTE — ED Notes (Signed)
States she has not been taking her Coreg x approx 3 months; has appt with cardiologist next month.

## 2013-05-26 NOTE — ED Provider Notes (Signed)
CSN: [id number]     Arrival date & time 05/26/13  1821 History     First MD Initiated Contact with Patient 05/26/13 1909     Chief Complaint  Patient presents with  . Cough  . Nasal Congestion   (Consider location/radiation/quality/duration/timing/severity/associated sxs/prior Treatment) HPI Comments: 32 year old female with history of CHF on pacer since childhood among other comorbidities. Here complaining of nasal congestion and sinus pressure associated with headache for over 2 weeks in the last week she's has also noticed nasal drainage is thicker and greenish she also has postnasal drip that makes her cough and has developed hoarseness. Denies fever or chills. Appetite somehow decreased during the last week. Has had intermittent nausea is feeling tired in the last few days. Not taking any medication for her symptoms Denies chest pain or shortness of breath. No syncope, palpitations or seizures. She's not currently taking her Coreg. She has a cardiologist in town.    Past Medical History  Diagnosis Date  . Heart palpitations   . Cardiac pacemaker     Since age 78. Last generator change in 2004  . Congenital complete AV block   . Obesity   . GERD (gastroesophageal reflux disease)   . Asymptomatic LV dysfunction     Echo in Dec 2011 with EF 35 to 40%. Felt to be due to paced rhythm  . Seizures     as a child- from high fever  . Anxiety   . Bipolar affective disorder   . Depression     bipolar  . Pacemaker     4-chamber  . Heart murmur     when I was little  . Carpal tunnel syndrome of right wrist   . Constipation   . Anemia     after child birth was put on Iron.  . Seasonal allergies   . Asthma   . CHF (congestive heart failure)     R/T 4-chamber pacemaker   Past Surgical History  Procedure Laterality Date  . Throat surgery  1994    s/p laser treatment  . Cesarean section    . Insert / replace / remove pacemaker      2001  . Cholecystectomy    . Iud removal   11/03/2011    Procedure: INTRAUTERINE DEVICE (IUD) REMOVAL;  Surgeon: Myra C. Marice Potter, MD;  Location: [facility];  Service: Gynecology;  Laterality: N/A;  . Cyst excision  12/10/2012    THYROID  . Thyroglossal duct cyst N/A 12/10/2012    Procedure: REVISION OF THYROGLOSSAL DUCT CYST EXCISION;  Surgeon: Serena Colonel, MD;  Location: [facility];  Service: ENT;  Laterality: N/A;  Revision of Thyroglossal Duct Cyst Excision   Family History  Problem Relation Age of Onset  . Heart disease Mother   . Hypertension Mother   . Heart disease Father   . Heart disease Sister     cardiomegaly and atherosclerosis   History  Substance Use Topics  . Smoking status: Former Smoker    Types: Cigarettes  . Smokeless tobacco: Never Used  . Alcohol Use: No   OB History   Grav Para Term Preterm Abortions TAB SAB Ect Mult Living   1 1 1       1      Review of Systems  Constitutional: Negative for fever, chills and fatigue.  HENT: Positive for congestion, rhinorrhea, postnasal drip and sinus pressure. Negative for sore throat and trouble swallowing.   Eyes: Negative for discharge.  Respiratory: Positive for cough. Negative  for shortness of breath and wheezing.   Cardiovascular: Negative for chest pain and leg swelling.  Gastrointestinal: Negative for nausea, vomiting, abdominal pain and abdominal distention.  Skin: Negative for rash.  Neurological: Negative for dizziness and headaches.  All other systems reviewed and are negative.    Allergies  Sertraline hcl  Home Medications   Current Outpatient Rx  Name  Route  Sig  Dispense  Refill  . amoxicillin (AMOXIL) 500 MG capsule   Oral   Take 1 capsule (500 mg total) by mouth 3 (three) times daily.   21 capsule   0   . benzonatate (TESSALON) 100 MG capsule   Oral   Take 1 capsule (100 mg total) by mouth every 8 (eight) hours.   21 capsule   0   . carvedilol (COREG) 3.125 MG tablet   Oral   Take 1 tablet (3.125 mg total) by mouth 2 (two) times daily.    180 tablet   3   . fluticasone (FLONASE) 50 MCG/ACT nasal spray   Nasal   Place 2 sprays into the nose daily.   16 g   0   . predniSONE (DELTASONE) 20 MG tablet      2 tabs by mouth daily for 5 days   10 tablet   0    BP 100/63  Pulse 100  Temp(Src) 98.7 F (37.1 C) (Oral)  Resp 18  SpO2 99% Physical Exam  Nursing note and vitals reviewed. Constitutional: She is oriented to person, place, and time. She appears well-developed and well-nourished. No distress.  HENT:  Head: Normocephalic and atraumatic.  Right Ear: External ear normal.  Left Ear: External ear normal.  Nasal Congestion with erythema and swelling of nasal turbinates, yellow rhinorrhea., post nasal drip. pharyngeal erythema no exudates. No uvula deviation. No trismus. TM's normal  Eyes: Conjunctivae are normal. Right eye exhibits no discharge. Left eye exhibits no discharge. No scleral icterus.  Neck: No JVD present.  Cardiovascular: Normal rate and regular rhythm.  Exam reveals no gallop.   No LEE  Pulmonary/Chest: Effort normal and breath sounds normal. No respiratory distress. She has no wheezes. She has no rales. She exhibits no tenderness.  Lymphadenopathy:    She has no cervical adenopathy.  Neurological: She is alert and oriented to person, place, and time.  Skin: No rash noted. She is not diaphoretic.  Psychiatric: She has a normal mood and affect. Her behavior is normal. Judgment and thought content normal.    ED Course   Procedures (including critical care time)  Labs Reviewed - No data to display No results found. 1. Sinusitis     MDM  Treated with amoxicillin, prednisone, Flonase and Tessalon Perles. Supportive care and red flags that should prompt return to medical attention discussed with patient and provided in writing.  Sharin Grave, MD 05/28/13 1610

## 2013-05-26 NOTE — ED Notes (Signed)
Started with allergy-type sxs and HA approx 1 wk ago; sxs then progressed to nasal pressure, green nasal drainage, hoarse voice, cough with green sputum, generalized weakness.  Has not been taking any meds to help alleviate sxs.

## 2013-06-10 ENCOUNTER — Encounter: Payer: Self-pay | Admitting: Emergency Medicine

## 2013-07-19 ENCOUNTER — Telehealth: Payer: Self-pay | Admitting: Internal Medicine

## 2013-07-19 NOTE — Telephone Encounter (Signed)
Received request from Nurse, documents faxed for surgical clearance. To: [facility] Fax number: [phone] Attention: 07/19/13/KM

## 2013-07-22 ENCOUNTER — Other Ambulatory Visit: Payer: Self-pay | Admitting: Orthopedic Surgery

## 2013-07-22 ENCOUNTER — Ambulatory Visit (INDEPENDENT_AMBULATORY_CARE_PROVIDER_SITE_OTHER): Payer: Medicare Other | Admitting: Emergency Medicine

## 2013-07-22 ENCOUNTER — Encounter: Payer: Self-pay | Admitting: Emergency Medicine

## 2013-07-22 VITALS — BP 101/69 | HR 76 | Temp 98.0°F | Ht 66.0 in | Wt 234.2 lb

## 2013-07-22 DIAGNOSIS — E749 Disorder of carbohydrate metabolism, unspecified: Secondary | ICD-10-CM

## 2013-07-22 DIAGNOSIS — R7309 Other abnormal glucose: Secondary | ICD-10-CM

## 2013-07-22 DIAGNOSIS — E669 Obesity, unspecified: Secondary | ICD-10-CM

## 2013-07-22 DIAGNOSIS — Z131 Encounter for screening for diabetes mellitus: Secondary | ICD-10-CM

## 2013-07-22 DIAGNOSIS — L83 Acanthosis nigricans: Secondary | ICD-10-CM

## 2013-07-22 DIAGNOSIS — I5022 Chronic systolic (congestive) heart failure: Secondary | ICD-10-CM

## 2013-07-22 DIAGNOSIS — I10 Essential (primary) hypertension: Secondary | ICD-10-CM

## 2013-07-22 LAB — POCT GLYCOSYLATED HEMOGLOBIN (HGB A1C): Hemoglobin A1C: 6

## 2013-07-22 MED ORDER — FLUTICASONE PROPIONATE 50 MCG/ACT NA SUSP: 2.0000 | Freq: Every day | NASAL | Status: DC

## 2013-07-22 MED ORDER — OMEPRAZOLE 20 MG PO CPDR: 20.0000 mg | DELAYED_RELEASE_CAPSULE | Freq: Every day | ORAL | Status: DC | PRN

## 2013-07-22 MED ORDER — CARVEDILOL 3.125 MG PO TABS: 3.1250 mg | ORAL_TABLET | Freq: Two times a day (BID) | ORAL | Status: DC

## 2013-07-22 NOTE — Patient Instructions (Addendum)
It was nice to see you!  We are checking some blood work today.  I will call you if anything is wrong, otherwise you will get a letter with the results in the mail in 1-2 weeks.  Things to do to Keep yourself Healthy - Exercise at least 30-45 minutes a day,  3-4 days a week.  - Eat a low-fat diet with lots of fruits and vegetables, up to 7-9 servings per day. - Seatbelts can save your life. Wear them always. - Smoke detectors on every level of your home, check batteries every year. - Eye Doctor - have an eye exam every 1-2 years - Safe sex - if you may be exposed to STDs, use a condom. - Alcohol If you drink, do it moderately,less than 2 drinks per day. - Health Care Power of Attorney.  Choose someone to speak for you if you are not able. - Depression is common in our stressful world.If you're feeling down or losing interest in things you normally enjoy, please come in for a visit. - Violence - If anyone is threatening or hurting you, please call immediately.  Please call Dr. Gerilyn Pilgrim for a nutrition consult.  Follow up with me in 3 months to see how the weight loss is going.

## 2013-07-22 NOTE — Pre-Procedure Instructions (Signed)
Makayla Vasquez  07/22/2013   Your procedure is scheduled on:  Tuesday, July 26, 2013 at 3:00 PM.   Report to [facility] Main Entrance "A" at 1:00 PM.    Call this number if you have problems the morning of surgery: [phone]   Remember:   Do not eat food or drink liquids after midnight tonight, 07/25/13.   Take these medicines the morning of surgery with A SIP OF WATER:  carvedilol (COREG), fluticasone (FLONASE), omeprazole (PRILOSEC)      Do not wear jewelry, make-up or nail polish.  Do not wear lotions, powders, or perfumes. You may wear deodorant.  Do not shave 48 hours prior to surgery.   Do not bring valuables to the hospital.  [facility] is not responsible                  for any belongings or valuables.               Contacts, dentures or bridgework may not be worn into surgery.  Leave suitcase in the car. After surgery it may be brought to your room.  For patients admitted to the hospital, discharge time is determined by your                treatment team.               Patients discharged the day of surgery will not be allowed to drive  home.  Name and phone number of your driver: Family/friend   Special Instructions: Shower using CHG 2 nights before surgery and the night before surgery.  If you shower the day of surgery use CHG.  Use special wash - you have one bottle of CHG for all showers.  You should use approximately 1/3 of the bottle for each shower.   Please read over the following fact sheets that you were given: Pain Booklet, Coughing and Deep Breathing and Surgical Site Infection Prevention

## 2013-07-22 NOTE — Assessment & Plan Note (Signed)
She is interested in weight loss.  Has been struggling at home. Discussed avoidance of starches.  Increase fruits and veggies. Dr. Gerilyn Pilgrim card given. Discussed starting metformin to aid in weight loss - will defer at this time. Follow up in 3 months.

## 2013-07-22 NOTE — Assessment & Plan Note (Addendum)
Euvolemic on exam. Restarted low dose carvedilol.

## 2013-07-22 NOTE — Assessment & Plan Note (Signed)
A1c is 6.0 - prediabetes. Weight loss discussed.

## 2013-07-22 NOTE — Progress Notes (Signed)
  Subjective:    Patient ID: Makayla Vasquez, female    DOB: November 17, 1980, 32 y.o.   MRN: [id number]  HPI Makayla Vasquez is here for physical.  I have reviewed and updated the following as appropriate: allergies, current medications, past family history, past medical history, past social history and past surgical history PMHx: HFrEF, congenital complete heart block s/p pacemaker, carpal tunnel  SHx: former smoker  Obesity She reports trying to loose weight at home.  Diet consists of starches and chicken.  She tries to eat veggies but doesn't like them that much.  Drinks soda as she does not like the taste of plain water.  Does a lot of walking at work, but does not exercise otherwise.   Review of Systems  Respiratory: Negative.   Cardiovascular: Negative.   Gastrointestinal: Negative.   Genitourinary: Negative.   Neurological: Negative.        Objective:   Physical Exam BP 101/69  Pulse 76  Temp(Src) 98 F (36.7 C) (Oral)  Ht 5\' 6"  (1.676 m)  Wt 234 lb 3.2 oz (106.232 kg)  BMI 37.82 kg/m2 Gen: alert, cooperative, NAD HEENT: AT/Searcy, sclera white, PERRL, MMM Neck: supple, no LAD CV: RRR, no murmurs Pulm: CTAB, no wheezes or rales Abd: +BS, soft, NTND Ext: no edema, 2+ DP pulses bilaterally Neuro: PERRL, 5/5 strength in all extremities; sensation to light touch grossly intact throughout; 2+ and symmetric patellar reflexes     Assessment & Plan:  I spent 40 minutes with the patient, > 50% spent in counseling the patient.

## 2013-07-25 ENCOUNTER — Encounter (HOSPITAL_COMMUNITY): Payer: Self-pay

## 2013-07-25 ENCOUNTER — Encounter (HOSPITAL_COMMUNITY)
Admission: RE | Admit: 2013-07-25 | Discharge: 2013-07-25 | Disposition: A | Discharge status: Home / Self Care | Payer: Medicare Other | Source: Ambulatory Visit | Attending: Orthopedic Surgery | Admitting: Orthopedic Surgery

## 2013-07-25 ENCOUNTER — Telehealth: Payer: Self-pay | Admitting: Internal Medicine

## 2013-07-25 HISTORY — DX: Unspecified osteoarthritis, unspecified site: M19.90

## 2013-07-25 LAB — CBC
MCV: 85.5 fL (ref 78.0–100.0)
Platelets: 214 10*3/uL (ref 150–400)
RBC: 4.88 MIL/uL (ref 3.87–5.11)
WBC: 3.8 10*3/uL — ABNORMAL LOW (ref 4.0–10.5)

## 2013-07-25 LAB — BASIC METABOLIC PANEL
CO2: 22 mEq/L (ref 19–32)
Calcium: 9 mg/dL (ref 8.4–10.5)
Chloride: 110 mEq/L (ref 96–112)
Sodium: 143 mEq/L (ref 135–145)

## 2013-07-25 MED ORDER — CEFAZOLIN SODIUM-DEXTROSE 2-3 GM-% IV SOLR: 2.0000 g | INTRAVENOUS | Status: DC

## 2013-07-25 NOTE — Progress Notes (Signed)
Call to device clinic again, no one avail to come to phone, seeing pts, faxed the device order again to [facility], aware that surgery is tomorrow.

## 2013-07-25 NOTE — Progress Notes (Signed)
Call to [facility], reviewed pt. Last seen by Dr. Ladona Ridgel 09/2012. Reviewed pt. 's history, pacemaker since age 32y.o.

## 2013-07-25 NOTE — Telephone Encounter (Signed)
Faxed request at 4:50pm

## 2013-07-25 NOTE — Telephone Encounter (Signed)
New problem   Pt is having sx 07/26/13 and need to have device order fax to her please to [phone].

## 2013-07-25 NOTE — Progress Notes (Signed)
Anesthesia Chart Review:  See my noted from 12/07/12.  She has since undergone revision of thyroglossal duct cyst on 12/10/12.  She was cleared by her cardiologist for that procedure.  She is now scheduled for right limited open carpal tunnel release, right cubital tunnel release, in situ versus ulnar nerve decompression and anterior transposition on 07/26/13 by Dr. Amanda Pea. She tolerated surgery in February, so if no acute changes then I would anticipate that she could proceed as planned.  Makayla Vasquez [facility] Short Stay Center/Anesthesiology Phone [phone] 07/25/2013 5:49 PM

## 2013-07-25 NOTE — Progress Notes (Signed)
Spoke with Cordelia Pen at [facility] orth[facility]., requested that the surgical clearance be faxed to [phone]

## 2013-07-25 NOTE — Progress Notes (Signed)
Call to [facility], 2x's for confirmation that they rec'd the periop Rx for pacemaker that was faxed on 10/3, line busy 2 x's

## 2013-07-26 ENCOUNTER — Encounter (HOSPITAL_COMMUNITY)
Admission: RE | Disposition: A | Discharge status: Home / Self Care | Payer: Self-pay | Source: Ambulatory Visit | Attending: Orthopedic Surgery

## 2013-07-26 ENCOUNTER — Encounter (HOSPITAL_COMMUNITY): Payer: Self-pay | Admitting: Certified Registered Nurse Anesthetist

## 2013-07-26 ENCOUNTER — Ambulatory Visit (HOSPITAL_COMMUNITY): Payer: Medicare Other | Admitting: Certified Registered Nurse Anesthetist

## 2013-07-26 ENCOUNTER — Encounter (HOSPITAL_COMMUNITY): Payer: Self-pay | Admitting: Vascular Surgery

## 2013-07-26 ENCOUNTER — Ambulatory Visit (HOSPITAL_COMMUNITY)
Admission: RE | Admit: 2013-07-26 | Discharge: 2013-07-26 | Disposition: A | Discharge status: Home / Self Care | Payer: Medicare Other | Source: Ambulatory Visit | Attending: Orthopedic Surgery | Admitting: Orthopedic Surgery

## 2013-07-26 DIAGNOSIS — K219 Gastro-esophageal reflux disease without esophagitis: Secondary | ICD-10-CM | POA: Insufficient documentation

## 2013-07-26 DIAGNOSIS — J45909 Unspecified asthma, uncomplicated: Secondary | ICD-10-CM | POA: Insufficient documentation

## 2013-07-26 DIAGNOSIS — Z950 Presence of cardiac pacemaker: Secondary | ICD-10-CM | POA: Insufficient documentation

## 2013-07-26 DIAGNOSIS — G5620 Lesion of ulnar nerve, unspecified upper limb: Secondary | ICD-10-CM | POA: Insufficient documentation

## 2013-07-26 DIAGNOSIS — M069 Rheumatoid arthritis, unspecified: Secondary | ICD-10-CM | POA: Insufficient documentation

## 2013-07-26 DIAGNOSIS — E669 Obesity, unspecified: Secondary | ICD-10-CM | POA: Insufficient documentation

## 2013-07-26 DIAGNOSIS — F411 Generalized anxiety disorder: Secondary | ICD-10-CM | POA: Insufficient documentation

## 2013-07-26 DIAGNOSIS — Z01812 Encounter for preprocedural laboratory examination: Secondary | ICD-10-CM | POA: Insufficient documentation

## 2013-07-26 DIAGNOSIS — G5600 Carpal tunnel syndrome, unspecified upper limb: Secondary | ICD-10-CM | POA: Insufficient documentation

## 2013-07-26 DIAGNOSIS — I509 Heart failure, unspecified: Secondary | ICD-10-CM | POA: Insufficient documentation

## 2013-07-26 DIAGNOSIS — F3130 Bipolar disorder, current episode depressed, mild or moderate severity, unspecified: Secondary | ICD-10-CM | POA: Insufficient documentation

## 2013-07-26 HISTORY — PX: CARPAL TUNNEL WITH CUBITAL TUNNEL: SHX5608

## 2013-07-26 SURGERY — RELEASE, CARPAL TUNNEL AND CUBITAL TUNNEL
Anesthesia: General | Site: Wrist | Laterality: Right | Wound class: Clean

## 2013-07-26 MED ORDER — ONDANSETRON HCL 4 MG/2ML IJ SOLN
INTRAMUSCULAR | Status: DC | PRN
  Administered 2013-07-26: 4 mg via INTRAMUSCULAR

## 2013-07-26 MED ORDER — MIDAZOLAM HCL 2 MG/2ML IJ SOLN: 0.5000 mg | Freq: Once | INTRAMUSCULAR | Status: DC | PRN

## 2013-07-26 MED ORDER — BUPIVACAINE HCL (PF) 0.25 % IJ SOLN
INTRAMUSCULAR | Status: DC | PRN
  Administered 2013-07-26: 20 mL

## 2013-07-26 MED ORDER — VANCOMYCIN HCL IN DEXTROSE 1-5 GM/200ML-% IV SOLN
1000.0000 mg | Freq: Once | INTRAVENOUS | Status: DC
  Filled 2013-07-26: qty 200

## 2013-07-26 MED ORDER — MEPERIDINE HCL 25 MG/ML IJ SOLN: 6.2500 mg | INTRAMUSCULAR | Status: DC | PRN

## 2013-07-26 MED ORDER — HYDROMORPHONE HCL PF 1 MG/ML IJ SOLN
0.2500 mg | INTRAMUSCULAR | Status: DC | PRN
  Administered 2013-07-26 (×2): 0.5 mg via INTRAVENOUS

## 2013-07-26 MED ORDER — LACTATED RINGERS IV SOLN
INTRAVENOUS | Status: DC | PRN
  Administered 2013-07-26: 16:00:00 via INTRAVENOUS

## 2013-07-26 MED ORDER — FENTANYL CITRATE 0.05 MG/ML IJ SOLN
INTRAMUSCULAR | Status: DC | PRN
  Administered 2013-07-26: 100 ug via INTRAVENOUS
  Administered 2013-07-26: 50 ug via INTRAVENOUS

## 2013-07-26 MED ORDER — MIDAZOLAM HCL 5 MG/5ML IJ SOLN
INTRAMUSCULAR | Status: DC | PRN
  Administered 2013-07-26: 2 mg via INTRAVENOUS

## 2013-07-26 MED ORDER — PROMETHAZINE HCL 25 MG/ML IJ SOLN: 6.2500 mg | INTRAMUSCULAR | Status: DC | PRN

## 2013-07-26 MED ORDER — OXYCODONE HCL 5 MG PO TABS
ORAL_TABLET | ORAL | Status: AC
  Filled 2013-07-26: qty 1

## 2013-07-26 MED ORDER — OXYCODONE HCL 5 MG PO TABS
5.0000 mg | ORAL_TABLET | Freq: Once | ORAL | Status: AC | PRN
  Administered 2013-07-26: 5 mg via ORAL

## 2013-07-26 MED ORDER — LIDOCAINE HCL (CARDIAC) 20 MG/ML IV SOLN
INTRAVENOUS | Status: DC | PRN
  Administered 2013-07-26: 40 mg via INTRAVENOUS

## 2013-07-26 MED ORDER — CEFAZOLIN SODIUM-DEXTROSE 2-3 GM-% IV SOLR
INTRAVENOUS | Status: AC
  Administered 2013-07-26: 2 g via INTRAVENOUS
  Filled 2013-07-26: qty 50

## 2013-07-26 MED ORDER — BUPIVACAINE HCL (PF) 0.25 % IJ SOLN
INTRAMUSCULAR | Status: AC
  Filled 2013-07-26: qty 30

## 2013-07-26 MED ORDER — OXYCODONE HCL 5 MG PO TABS: 10.0000 mg | ORAL_TABLET | ORAL | Status: DC | PRN

## 2013-07-26 MED ORDER — PROPOFOL 10 MG/ML IV BOLUS
INTRAVENOUS | Status: DC | PRN
  Administered 2013-07-26: 200 mg via INTRAVENOUS

## 2013-07-26 MED ORDER — HYDROMORPHONE HCL PF 1 MG/ML IJ SOLN
INTRAMUSCULAR | Status: AC
  Filled 2013-07-26: qty 1

## 2013-07-26 MED ORDER — CHLORHEXIDINE GLUCONATE 4 % EX LIQD: 60.0000 mL | Freq: Once | CUTANEOUS | Status: DC

## 2013-07-26 MED ORDER — LACTATED RINGERS IV SOLN
INTRAVENOUS | Status: DC
  Administered 2013-07-26: 14:00:00 via INTRAVENOUS

## 2013-07-26 MED ORDER — SUCCINYLCHOLINE CHLORIDE 20 MG/ML IJ SOLN
INTRAMUSCULAR | Status: DC | PRN
  Administered 2013-07-26: 100 mg via INTRAVENOUS

## 2013-07-26 MED ORDER — OXYCODONE HCL 5 MG/5ML PO SOLN: 5.0000 mg | Freq: Once | ORAL | Status: AC | PRN

## 2013-07-26 SURGICAL SUPPLY — 47 items
BANDAGE ELASTIC 3 VELCRO ST LF (GAUZE/BANDAGES/DRESSINGS) ×3 IMPLANT
BANDAGE ELASTIC 4 VELCRO ST LF (GAUZE/BANDAGES/DRESSINGS) ×3 IMPLANT
BANDAGE GAUZE ELAST BULKY 4 IN (GAUZE/BANDAGES/DRESSINGS) ×2 IMPLANT
CLOTH BEACON ORANGE TIMEOUT ST (SAFETY) ×2 IMPLANT
CORDS BIPOLAR (ELECTRODE) ×2 IMPLANT
COVER SURGICAL LIGHT HANDLE (MISCELLANEOUS) ×2 IMPLANT
CUFF TOURNIQUET SINGLE 18IN (TOURNIQUET CUFF) ×2 IMPLANT
CUFF TOURNIQUET SINGLE 24IN (TOURNIQUET CUFF) IMPLANT
DRAPE SURG 17X23 STRL (DRAPES) ×2 IMPLANT
DRSG ADAPTIC 3X8 NADH LF (GAUZE/BANDAGES/DRESSINGS) ×1 IMPLANT
EVACUATOR 1/8 PVC DRAIN (DRAIN) IMPLANT
GAUZE XEROFORM 1X8 LF (GAUZE/BANDAGES/DRESSINGS) ×2 IMPLANT
GAUZE XEROFORM 5X9 LF (GAUZE/BANDAGES/DRESSINGS) ×1 IMPLANT
GLOVE BIOGEL M STRL SZ7.5 (GLOVE) ×2 IMPLANT
GLOVE SS BIOGEL STRL SZ 8 (GLOVE) ×1 IMPLANT
GLOVE SUPERSENSE BIOGEL SZ 8 (GLOVE) ×1
GOWN PREVENTION PLUS XLARGE (GOWN DISPOSABLE) ×2 IMPLANT
GOWN STRL NON-REIN LRG LVL3 (GOWN DISPOSABLE) ×4 IMPLANT
GOWN STRL REIN XL XLG (GOWN DISPOSABLE) ×4 IMPLANT
KIT BASIN OR (CUSTOM PROCEDURE TRAY) ×2 IMPLANT
KIT ROOM TURNOVER OR (KITS) ×2 IMPLANT
LOOP VESSEL MAXI BLUE (MISCELLANEOUS) IMPLANT
NDL HYPO 25GX1X1/2 BEV (NEEDLE) IMPLANT
NEEDLE HYPO 25GX1X1/2 BEV (NEEDLE) IMPLANT
NS IRRIG 1000ML POUR BTL (IV SOLUTION) ×2 IMPLANT
PACK ORTHO EXTREMITY (CUSTOM PROCEDURE TRAY) ×2 IMPLANT
PAD ARMBOARD 7.5X6 YLW CONV (MISCELLANEOUS) ×4 IMPLANT
PAD CAST 3X4 CTTN HI CHSV (CAST SUPPLIES) IMPLANT
PAD CAST 4YDX4 CTTN HI CHSV (CAST SUPPLIES) ×2 IMPLANT
PADDING CAST COTTON 3X4 STRL (CAST SUPPLIES) ×4
PADDING CAST COTTON 4X4 STRL (CAST SUPPLIES) ×4
SOLUTION BETADINE 4OZ (MISCELLANEOUS) ×2 IMPLANT
SPONGE GAUZE 4X4 12PLY (GAUZE/BANDAGES/DRESSINGS) ×2 IMPLANT
SPONGE SCRUB IODOPHOR (GAUZE/BANDAGES/DRESSINGS) ×2 IMPLANT
SUCTION FRAZIER TIP 10 FR DISP (SUCTIONS) IMPLANT
SUT PROLENE 4 0 PS 2 18 (SUTURE) ×2 IMPLANT
SUT VIC AB 2-0 CT1 27 (SUTURE)
SUT VIC AB 2-0 CT1 TAPERPNT 27 (SUTURE) IMPLANT
SUT VIC AB 3-0 FS2 27 (SUTURE) IMPLANT
SYR CONTROL 10ML LL (SYRINGE) IMPLANT
SYSTEM CHEST DRAIN TLS 7FR (DRAIN) IMPLANT
TOWEL OR 17X24 6PK STRL BLUE (TOWEL DISPOSABLE) ×2 IMPLANT
TOWEL OR 17X26 10 PK STRL BLUE (TOWEL DISPOSABLE) ×2 IMPLANT
TUBE CONNECTING 12X1/4 (SUCTIONS) IMPLANT
TUBE EVACUATION TLS (MISCELLANEOUS) ×2 IMPLANT
UNDERPAD 30X30 INCONTINENT (UNDERPADS AND DIAPERS) ×2 IMPLANT
WATER STERILE IRR 1000ML POUR (IV SOLUTION) ×2 IMPLANT

## 2013-07-26 NOTE — Anesthesia Postprocedure Evaluation (Signed)
  Anesthesia Post-op Note  Patient: Makayla Vasquez  Procedure(s) Performed: Procedure(s): RIGHT LIMITED OPEN CARPAL TUNNEL RELEASE ,  RIGHT CUBITAL TUNNEL RELEASE, INSITU VERSES ULNAR NERVE DECOMPRESSION AND ANTERIOR TRANSPOSITION (Right)  Patient Location: PACU  Anesthesia Type:General  Level of Consciousness: awake and alert   Airway and Oxygen Therapy: Patient Spontanous Breathing  Post-op Pain: mild  Post-op Assessment: Post-op Vital signs reviewed  Post-op Vital Signs: stable  Complications: No apparent anesthesia complications

## 2013-07-26 NOTE — Preoperative (Signed)
Beta Blockers   Reason not to administer Beta Blockers:Not Applicable  Pt took am Coreg 1030.

## 2013-07-26 NOTE — H&P (Signed)
Makayla Vasquez is an 32 y.o. female.   Chief Complaint: presents for right CTR and CUTR HPI: Marland KitchenMarland KitchenPatient presents for evaluation and treatment of the of their upper extremity predicament. The patient denies neck back chest or of abdominal pain. The patient notes that they have no lower extremity problems. The patient from primarily complains of the upper extremity pain noted.  Past Medical History  Diagnosis Date  . Heart palpitations   . Cardiac pacemaker     Since age 1. Last generator change in 2004  . Congenital complete AV block   . Obesity   . GERD (gastroesophageal reflux disease)   . Asymptomatic LV dysfunction     Echo in Dec 2011 with EF 35 to 40%. Felt to be due to paced rhythm  . Seizures     as a child- from high fever  . Anxiety   . Bipolar affective disorder   . Depression     bipolar  . Pacemaker     4-chamber  . Heart murmur     when I was little  . Carpal tunnel syndrome of right wrist   . Constipation   . Anemia     after child birth was put on Iron.  . Seasonal allergies   . CHF (congestive heart failure)     R/T 4-chamber pacemaker  . Automatic implantable cardioverter-defibrillator in situ     medtronic pacemaker   . Asthma     seasonal allergies   . Arthritis     rheumatoid arthritis- mild, no rheumatology care     Past Surgical History  Procedure Laterality Date  . Throat surgery  1994    s/p laser treatment  . Cesarean section    . Insert / replace / remove pacemaker      2001  . Cholecystectomy    . Iud removal  11/03/2011    Procedure: INTRAUTERINE DEVICE (IUD) REMOVAL;  Surgeon: Myra C. Marice Potter, MD;  Location: [facility];  Service: Gynecology;  Laterality: N/A;  . Cyst excision  12/10/2012    THYROID  . Thyroglossal duct cyst N/A 12/10/2012    Procedure: REVISION OF THYROGLOSSAL DUCT CYST EXCISION;  Surgeon: Serena Colonel, MD;  Location: [facility] OR;  Service: ENT;  Laterality: N/A;  Revision of Thyroglossal Duct Cyst Excision  . Nasal fracture surgery       /w plate     Family History  Problem Relation Age of Onset  . Heart disease Mother   . Hypertension Mother   . Heart disease Father   . Heart disease Sister     cardiomegaly and atherosclerosis   Social History:  reports that she quit smoking about 17 years ago. Her smoking use included Cigarettes. She smoked 0.00 packs per day for .5 years. She has never used smokeless tobacco. She reports that she does not drink alcohol or use illicit drugs.  Allergies:  Allergies  Allergen Reactions  . Sertraline Hcl Hives    Medications Prior to Admission  Medication Sig Dispense Refill  . carvedilol (COREG) 3.125 MG tablet Take 1 tablet (3.125 mg total) by mouth 2 (two) times daily.  180 tablet  3  . fluticasone (FLONASE) 50 MCG/ACT nasal spray Place 2 sprays into the nose daily.  16 g  0  . omeprazole (PRILOSEC) 20 MG capsule Take 1 capsule (20 mg total) by mouth daily as needed (heartburn).  30 capsule  3    Results for orders placed during the hospital encounter of 07/25/13 (from  the past 48 hour(s))  BASIC METABOLIC PANEL     Status: Abnormal   Collection Time    07/25/13 10:53 AM      Result Value Range   Sodium 143  135 - 145 mEq/L   Potassium 4.2  3.5 - 5.1 mEq/L   Chloride 110  96 - 112 mEq/L   CO2 22  19 - 32 mEq/L   Glucose, Bld 106 (*) 70 - 99 mg/dL   BUN 12  6 - 23 mg/dL   Creatinine, Ser 1.61  0.50 - 1.10 mg/dL   Calcium 9.0  8.4 - 09.6 mg/dL   GFR calc non Af Amer 85 (*) >90 mL/min   GFR calc Af Amer >90  >90 mL/min   Comment: (NOTE)     The eGFR has been calculated using the CKD EPI equation.     This calculation has not been validated in all clinical situations.     eGFR's persistently <90 mL/min signify possible Chronic Kidney     Disease.  CBC     Status: Abnormal   Collection Time    07/25/13 10:53 AM      Result Value Range   WBC 3.8 (*) 4.0 - 10.5 K/uL   RBC 4.88  3.87 - 5.11 MIL/uL   Hemoglobin 14.1  12.0 - 15.0 g/dL   HCT 04.5  40.9 - 81.1 %   MCV  85.5  78.0 - 100.0 fL   MCH 28.9  26.0 - 34.0 pg   MCHC 33.8  30.0 - 36.0 g/dL   RDW 91.4  78.2 - 95.6 %   Platelets 214  150 - 400 K/uL  HCG, SERUM, QUALITATIVE     Status: None   Collection Time    07/25/13 10:53 AM      Result Value Range   Preg, Serum NEGATIVE  NEGATIVE   Comment:            THE SENSITIVITY OF THIS     METHODOLOGY IS >10 mIU/mL.   No results found.  Review of Systems  Constitutional: Negative.   HENT: Negative.   Eyes: Negative.   Respiratory: Negative.   Cardiovascular: Negative.   Gastrointestinal: Negative.   Genitourinary: Negative.   Skin: Negative.   Neurological: Negative.   Endo/Heme/Allergies: Negative.     Blood pressure 118/74, pulse 66, temperature 97.4 F (36.3 C), temperature source Oral, resp. rate 20, SpO2 100.00%. Physical Exam R CTS and CUTS .Marland KitchenThe patient is alert and oriented in no acute distress the patient complains of pain in the affected upper extremity.  The patient is noted to have a normal HEENT exam.  Lung fields show equal chest expansion and no shortness of breath  abdomen exam is nontender without distention.  Lower extremity examination does not show any fracture dislocation or blood clot symptoms.  Pelvis is stable neck and back are stable and nontender Assessment/Plan .Marland KitchenWe are planning surgery for your upper extremity. The risk and benefits of surgery include risk of bleeding infection anesthesia damage to normal structures and failure of the surgery to accomplish its intended goals of relieving symptoms and restoring function with this in mind we'll going to proceed. I have specifically discussed with the patient the pre-and postoperative regime and the does and don'ts and risk and benefits in great detail. Risk and benefits of surgery also include risk of dystrophy chronic nerve pain failure of the healing process to go onto completion and other inherent risks of surgery The relavent the pathophysiology  of the  disease/injury process, as well as the alternatives for treatment and postoperative course of action has been discussed in great detail with the patient who desires to proceed.  We will do everything in our power to help you (the patient) restore function to the upper extremity. Is a pleasure to see this patient today.   Karen Chafe 07/26/2013, 5:04 PM

## 2013-07-26 NOTE — Anesthesia Procedure Notes (Signed)
Procedure Name: Intubation Date/Time: 07/26/2013 5:51 PM Performed by: Quentin Ore Pre-anesthesia Checklist: Patient identified, Emergency Drugs available, Suction available, Patient being monitored and Timeout performed Patient Re-evaluated:Patient Re-evaluated prior to inductionOxygen Delivery Method: Circle system utilized Preoxygenation: Pre-oxygenation with 100% oxygen Intubation Type: Rapid sequence Laryngoscope Size: Mac and 3 Grade View: Grade I Tube type: Oral Tube size: 7.5 mm Number of attempts: 1 Airway Equipment and Method: Stylet Placement Confirmation: ETT inserted through vocal cords under direct vision,  positive ETCO2 and breath sounds checked- equal and bilateral Secured at: 21 cm Tube secured with: Tape Dental Injury: Teeth and Oropharynx as per pre-operative assessment

## 2013-07-26 NOTE — Transfer of Care (Signed)
Immediate Anesthesia Transfer of Care Note  Patient: Makayla Vasquez  Procedure(s) Performed: Procedure(s): RIGHT LIMITED OPEN CARPAL TUNNEL RELEASE ,  RIGHT CUBITAL TUNNEL RELEASE, INSITU VERSES ULNAR NERVE DECOMPRESSION AND ANTERIOR TRANSPOSITION (Right)  Patient Location: PACU  Anesthesia Type:General  Level of Consciousness: awake, alert  and oriented  Airway & Oxygen Therapy: Patient Spontanous Breathing and Patient connected to nasal cannula oxygen  Post-op Assessment: Report given to PACU RN, Post -op Vital signs reviewed and stable and Patient moving all extremities X 4  Post vital signs: Reviewed and stable  Complications: No apparent anesthesia complications

## 2013-07-26 NOTE — Op Note (Signed)
See dictation # [id number] Makayla Offner MD

## 2013-07-26 NOTE — Anesthesia Preprocedure Evaluation (Addendum)
Anesthesia Evaluation  Patient identified by MRN, date of birth, ID band Patient awake    Reviewed: Allergy & Precautions, H&P , NPO status , Patient's Chart, lab work & pertinent test results, reviewed documented beta blocker date and time   History of Anesthesia Complications Negative for: history of anesthetic complications  Airway Mallampati: II TM Distance: >3 FB Neck ROM: Full    Dental  (+) Teeth Intact and Dental Advisory Given   Pulmonary asthma , former smoker,  breath sounds clear to auscultation  Pulmonary exam normal       Cardiovascular hypertension, Pt. on home beta blockers +CHF + pacemaker Rhythm:Regular Rate:Normal  EF 30-35% Mild MR, TR Global hypokinesis LV  Congenital complete heart block  + Pacemaker, AICD   Neuro/Psych PSYCHIATRIC DISORDERS Anxiety Depression Bipolar Disorder    GI/Hepatic Neg liver ROS, GERD-  Medicated and Poorly Controlled,  Endo/Other  Morbid obesityObesity  Renal/GU negative Renal ROS     Musculoskeletal negative musculoskeletal ROS (+)   Abdominal (+) + obese,   Peds  Hematology negative hematology ROS (+)   Anesthesia Other Findings   Reproductive/Obstetrics negative OB ROS IUD preg test NEG 07/25/13                         Anesthesia Physical Anesthesia Plan  ASA: III  Anesthesia Plan: General   Post-op Pain Management:    Induction: Intravenous  Airway Management Planned: Oral ETT  Additional Equipment:   Intra-op Plan:   Post-operative Plan: Extubation in OR  Informed Consent: I have reviewed the patients History and Physical, chart, labs and discussed the procedure including the risks, benefits and alternatives for the proposed anesthesia with the patient or authorized representative who has indicated his/her understanding and acceptance.   Dental advisory given  Plan Discussed with: CRNA, Anesthesiologist and  Surgeon  Anesthesia Plan Comments: (Plan routine monitors, GETA)       Anesthesia Quick Evaluation

## 2013-07-27 NOTE — Op Note (Signed)
NAME:  SATIVA, GELLES NO.:  [id number]  MEDICAL RECORD NO.:  [id number]  LOCATION:  [facility]                         FACILITY:  [facility]  PHYSICIAN:  Dionne Ano. Suriah Peragine, M.D.DATE OF BIRTH:  [birth year]  DATE OF PROCEDURE: DATE OF DISCHARGE:  07/26/2013                              OPERATIVE REPORT   POSTOPERATIVE DIAGNOSES: 1. Right carpal tunnel syndrome. 2. Right cubital tunnel syndrome.  POSTOPERATIVE DIAGNOSES: 1. Right carpal tunnel syndrome. 2. Right cubital tunnel syndrome.  PROCEDURE: 1. Right carpal tunnel release. 2. Right cubital tunnel release (ulnar nerve release at the elbow).  SURGEON:  Dionne Ano. Amanda Pea, M.D.  ASSISTANT:  Karie Chimera, P.A.-C.  COMPLICATION:  None.  ANESTHESIA:  General.  TOURNIQUET TIME:  Less than 30 minutes.  DRAINS:  None.  INDICATIONS FOR THE PROCEDURE:  This patient is very pleasant female, 32 years of age.  She presents with the above-mentioned diagnoses.  I have counseled her in regard to risks and benefits of surgery, and she desires to proceed.  OPERATIVE PROCEDURE:  The patient was seen by myself and Anesthesia, taken to the operative suite, underwent general anesthetic, prepped and draped in the usual sterile fashion with Betadine scrub and paint. Following this, the patient underwent time-out.  Pre and postop check list was completed.  Body parts well padded.  Tourniquet was insufflated under sterile conditions.  A 2-cm incision was made at the distal edge of the transverse carpal ligament.  Palmar fascia was incised.  Distal edge of the transverse carpal ligament will be released using 4.0 loupe magnification distally, and following this, distal to proximal dissection was carried out until adequate room was available to slide the blunt tip scissors to remaining leaflet and portions of the antebrachial fascia under direct vision.  Median nerve was hyperemic, intact, and fully decompressed.  I irrigated  copiously, closed the wound at the end of the case with Prolene after hemostasis was secured.  Small amount of Sensorcaine was placed in the wound for postop analgesia.  Following this, a curvilinear incision was made in the elbow approximately 2-1/2 to 3 inches.  Dissection was carried down.  The patient underwent ulnar nerve release about the arcade of Struthers, medial intermuscular septum, cubital tunnel, Osborne ligament, and 2 heads of the FCU, superficial and deep.  She tracked well.  The nerve was hyperemic and had evidence of compression, but did not sublux, and thus we chose for an in situ release.  I irrigated copiously with tourniquet down, deflated it without complicating feature, of course, and secured hemostasis.  The wound was closed with Prolene.  There were no complicating features.  Soft dressing was applied.  Sensorcaine 10 mL was placed in the elbow region for postop analgesia.  She will continue her regular medicines.  Oxycodone was written for pain.  She will see [place] back in the office in 12 days for followup.     Dionne Ano. Amanda Pea, M.D.     [facility]  D:  07/26/2013  T:  07/27/2013  Job:  [id number]

## 2013-07-29 ENCOUNTER — Encounter (HOSPITAL_COMMUNITY): Payer: Self-pay | Admitting: Orthopedic Surgery

## 2013-07-31 IMAGING — US US TRANSVAGINAL NON-OB
1 series · 14 of 25 positions shown · non-contrast
Comparison: 03/24/2011

CLINICAL DATA: Inability to locate IUD string

TRANSVAGINAL ULTRASOUND OF PELVIS
TECHNIQUE: Transvaginal ultrasound examination of the pelvis was
performed including evaluation of the uterus, ovaries, adnexal
regions, and pelvic cul-de-sac.

[Series 1: us transvaginal non-ob · 14 of 45 slices shown]
[im 1/45]
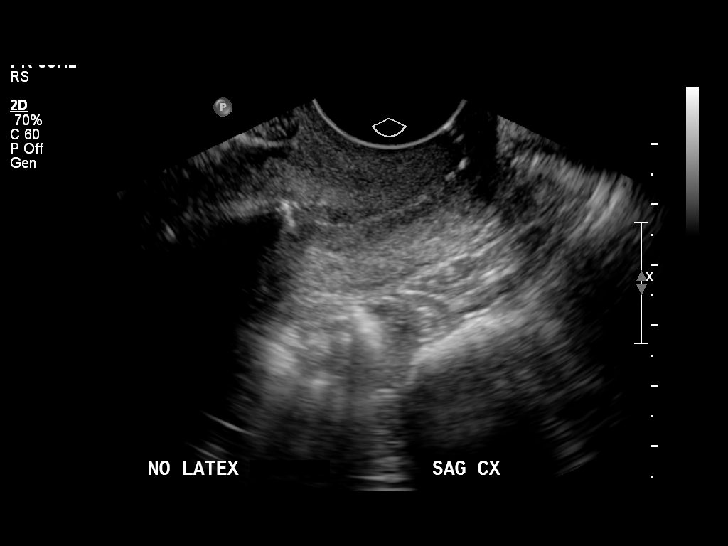
[im 4/45]
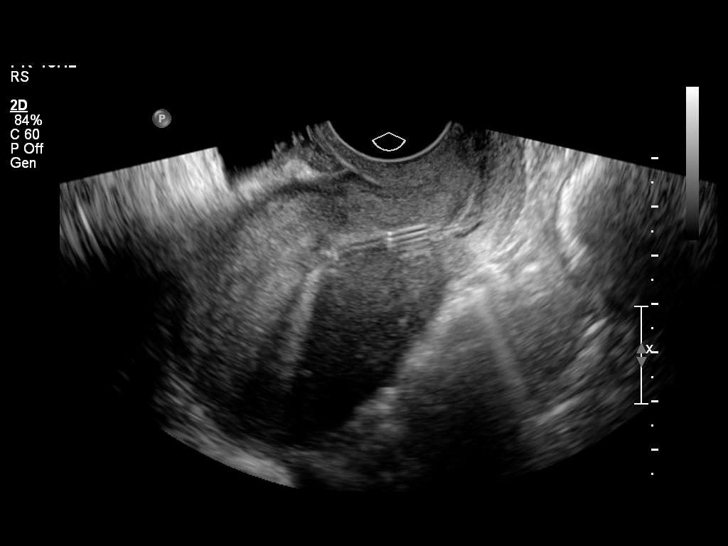
[im 8/45]
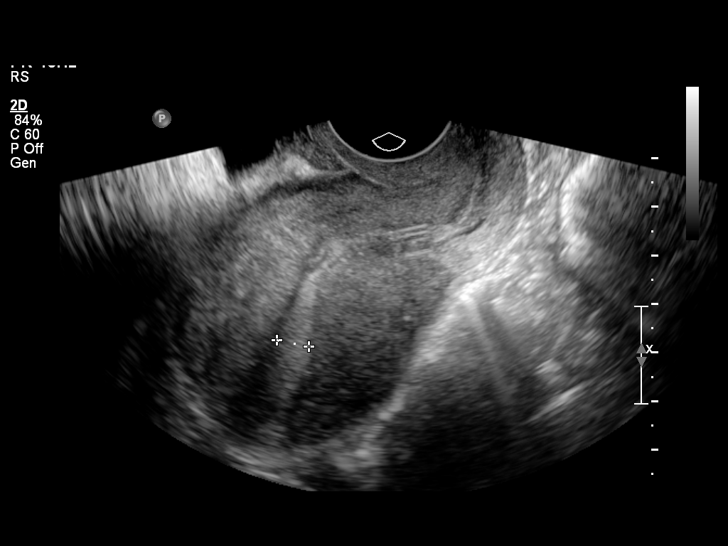
[im 12/45]
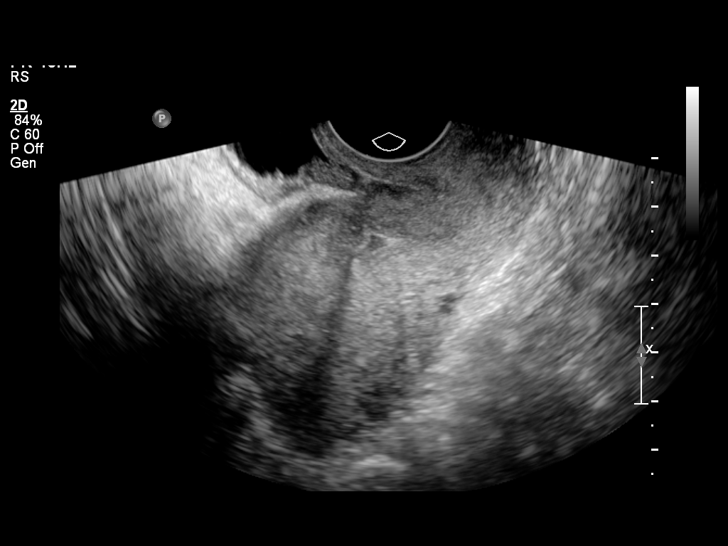
[im 15/45]
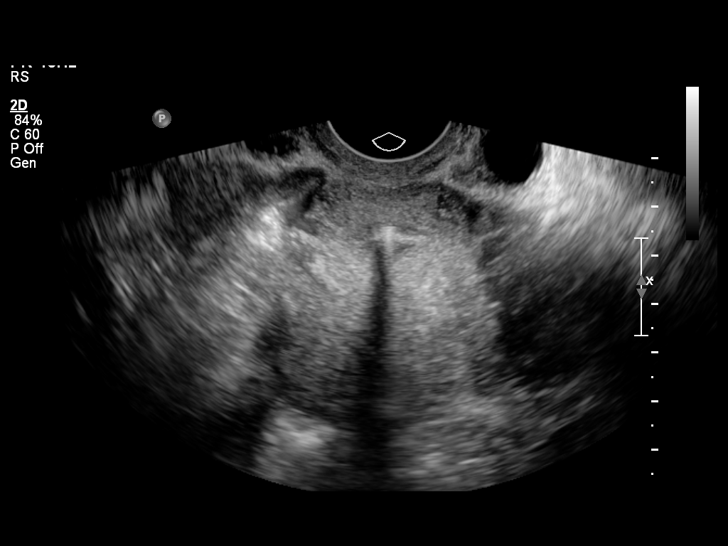
[im 17/45]
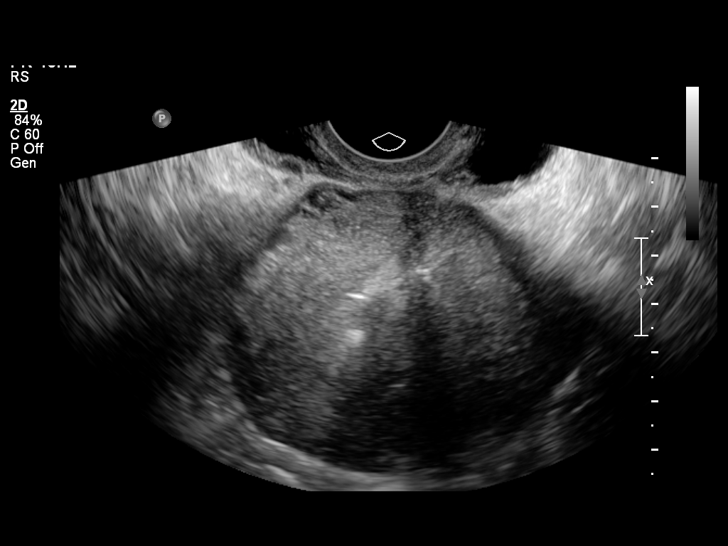
[im 21/45]
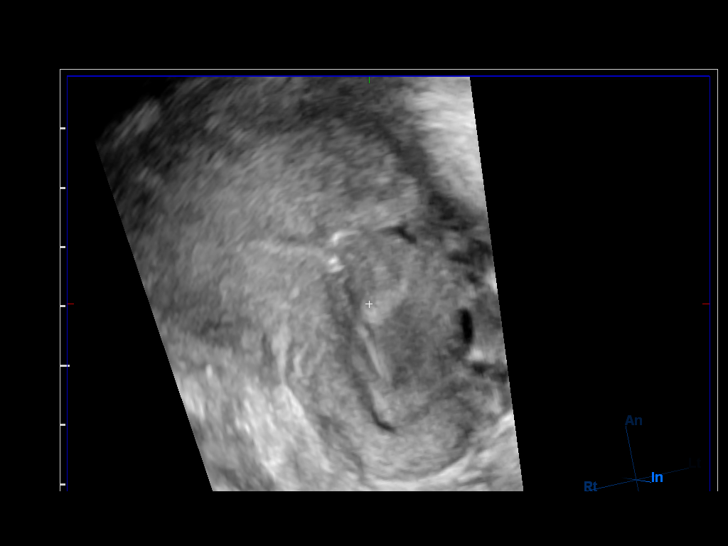
[im 24/45]
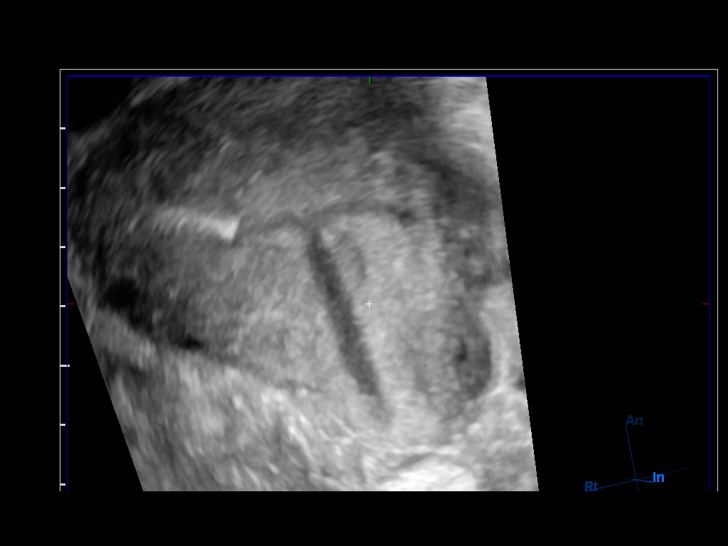
[im 28/45]
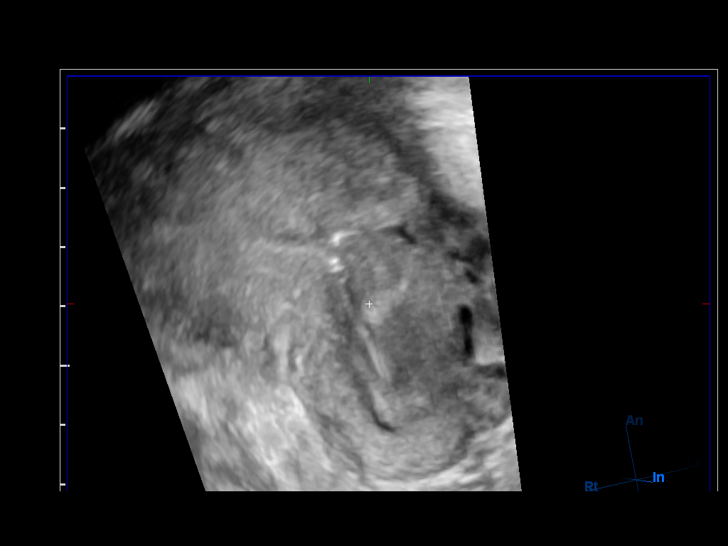
[im 30/45]
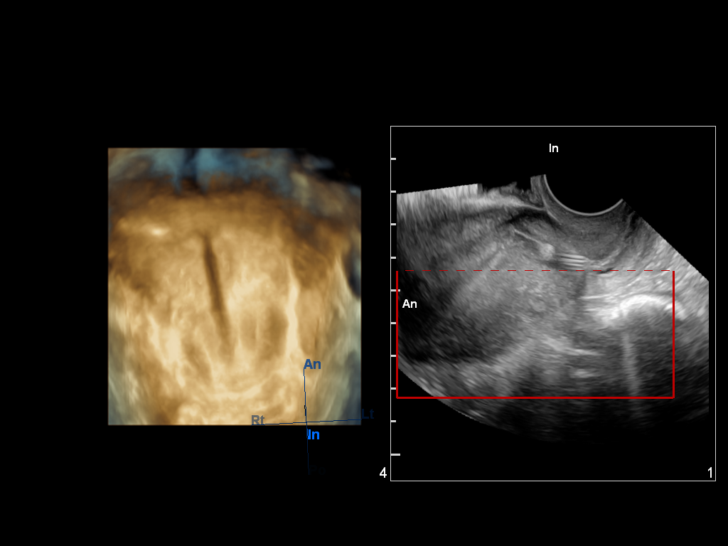
[im 34/45]
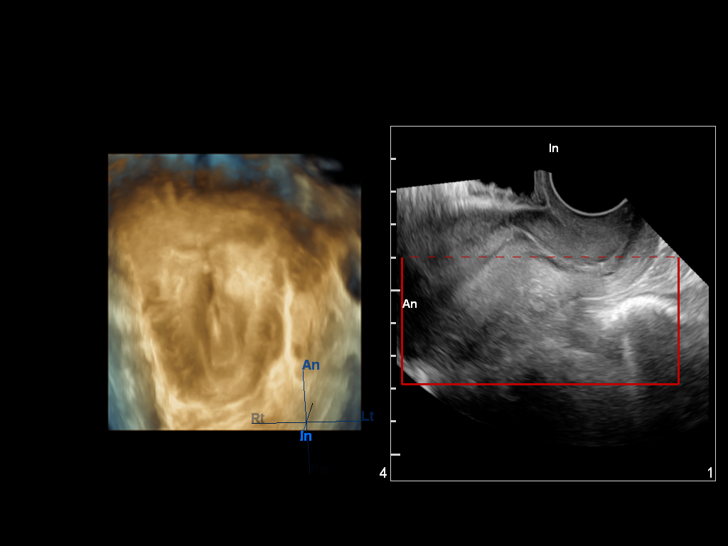
[im 37/45]
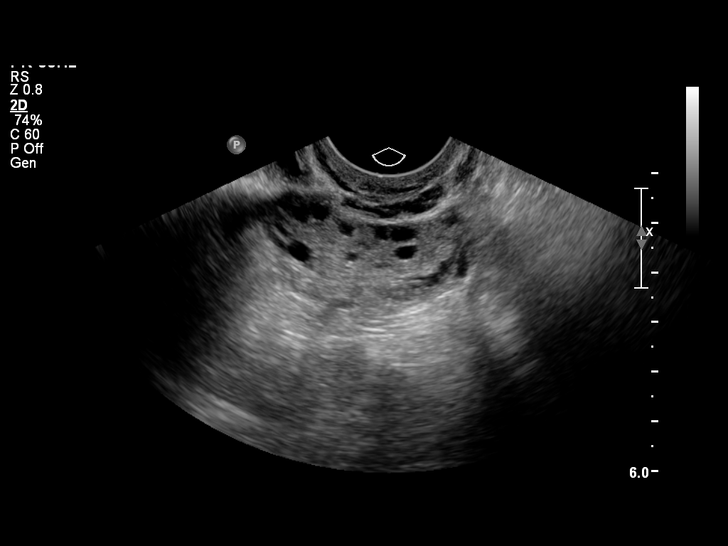
[im 41/45]
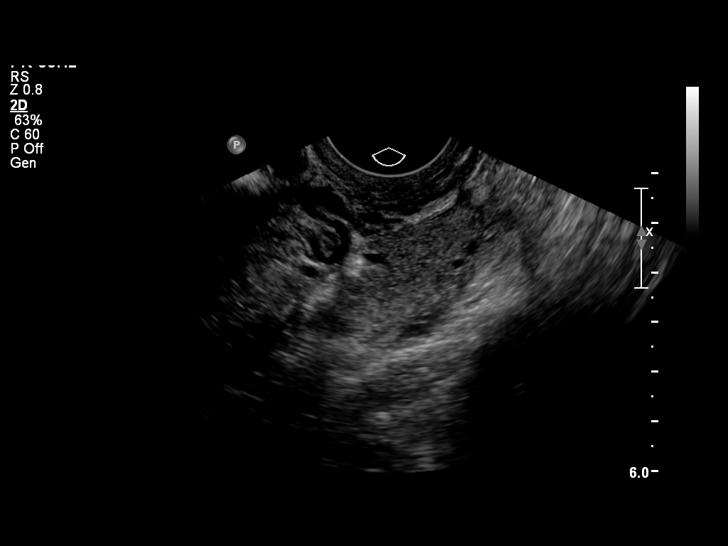
[im 45/45]
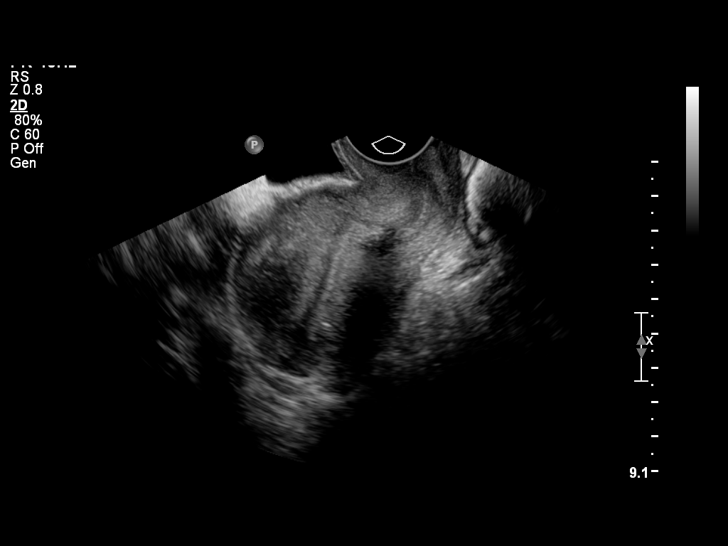

[14 of 25 positions shown; findings below may reference images not displayed]

FINDINGS: Uterus:  8.4 x 6.3 x 4.5 cm.  Anteverted, retroflexed.

Endometrium: 7 mm.  Uniformly echogenic.  IUD remains low lying
within the lower uterine segment endometrial canal.

Right ovary: 3.7 x 2.2 x 1.2 cm.  Normal.

Left ovary: 3.5 x 1.8 x 1.6 cm.  Normal.

Other Findings:  No free fluid
IMPRESSION: IUD remains abnormally low lying within the lower uterine segment
endometrial canal.

## 2013-08-23 ENCOUNTER — Ambulatory Visit (INDEPENDENT_AMBULATORY_CARE_PROVIDER_SITE_OTHER): Payer: Medicare Other | Admitting: Family Medicine

## 2013-08-23 ENCOUNTER — Encounter: Payer: Self-pay | Admitting: Family Medicine

## 2013-08-23 VITALS — BP 118/82 | HR 74 | Temp 98.6°F | Wt 231.0 lb

## 2013-08-23 DIAGNOSIS — J069 Acute upper respiratory infection, unspecified: Secondary | ICD-10-CM

## 2013-08-23 IMAGING — CR DG CHEST 2V
2 series · 2 of 2 positions shown · non-contrast
Comparison: 04/04/2010

CLINICAL DATA: Left-sided chest pain

CHEST - 2 VIEW

[w chest pa]
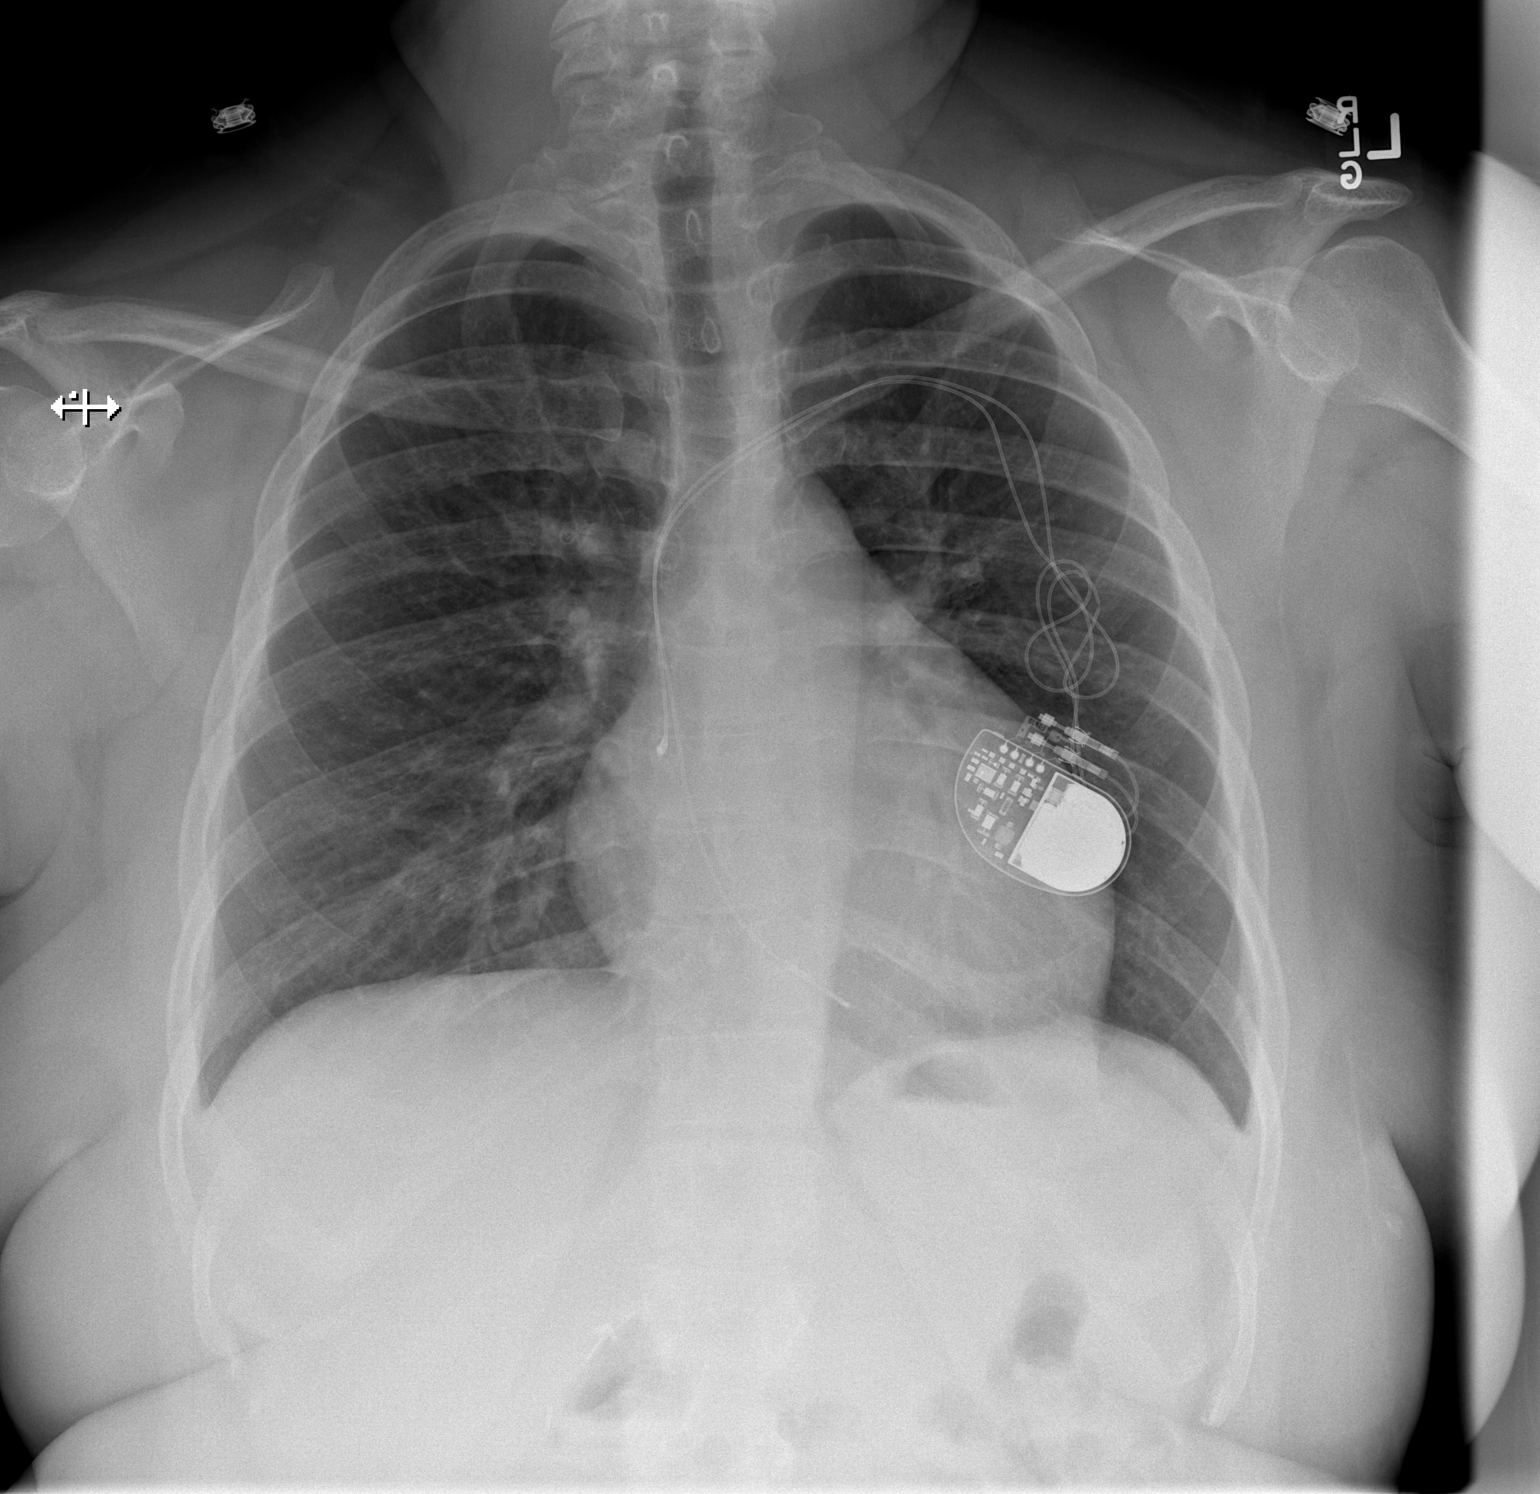

[w chest lat]
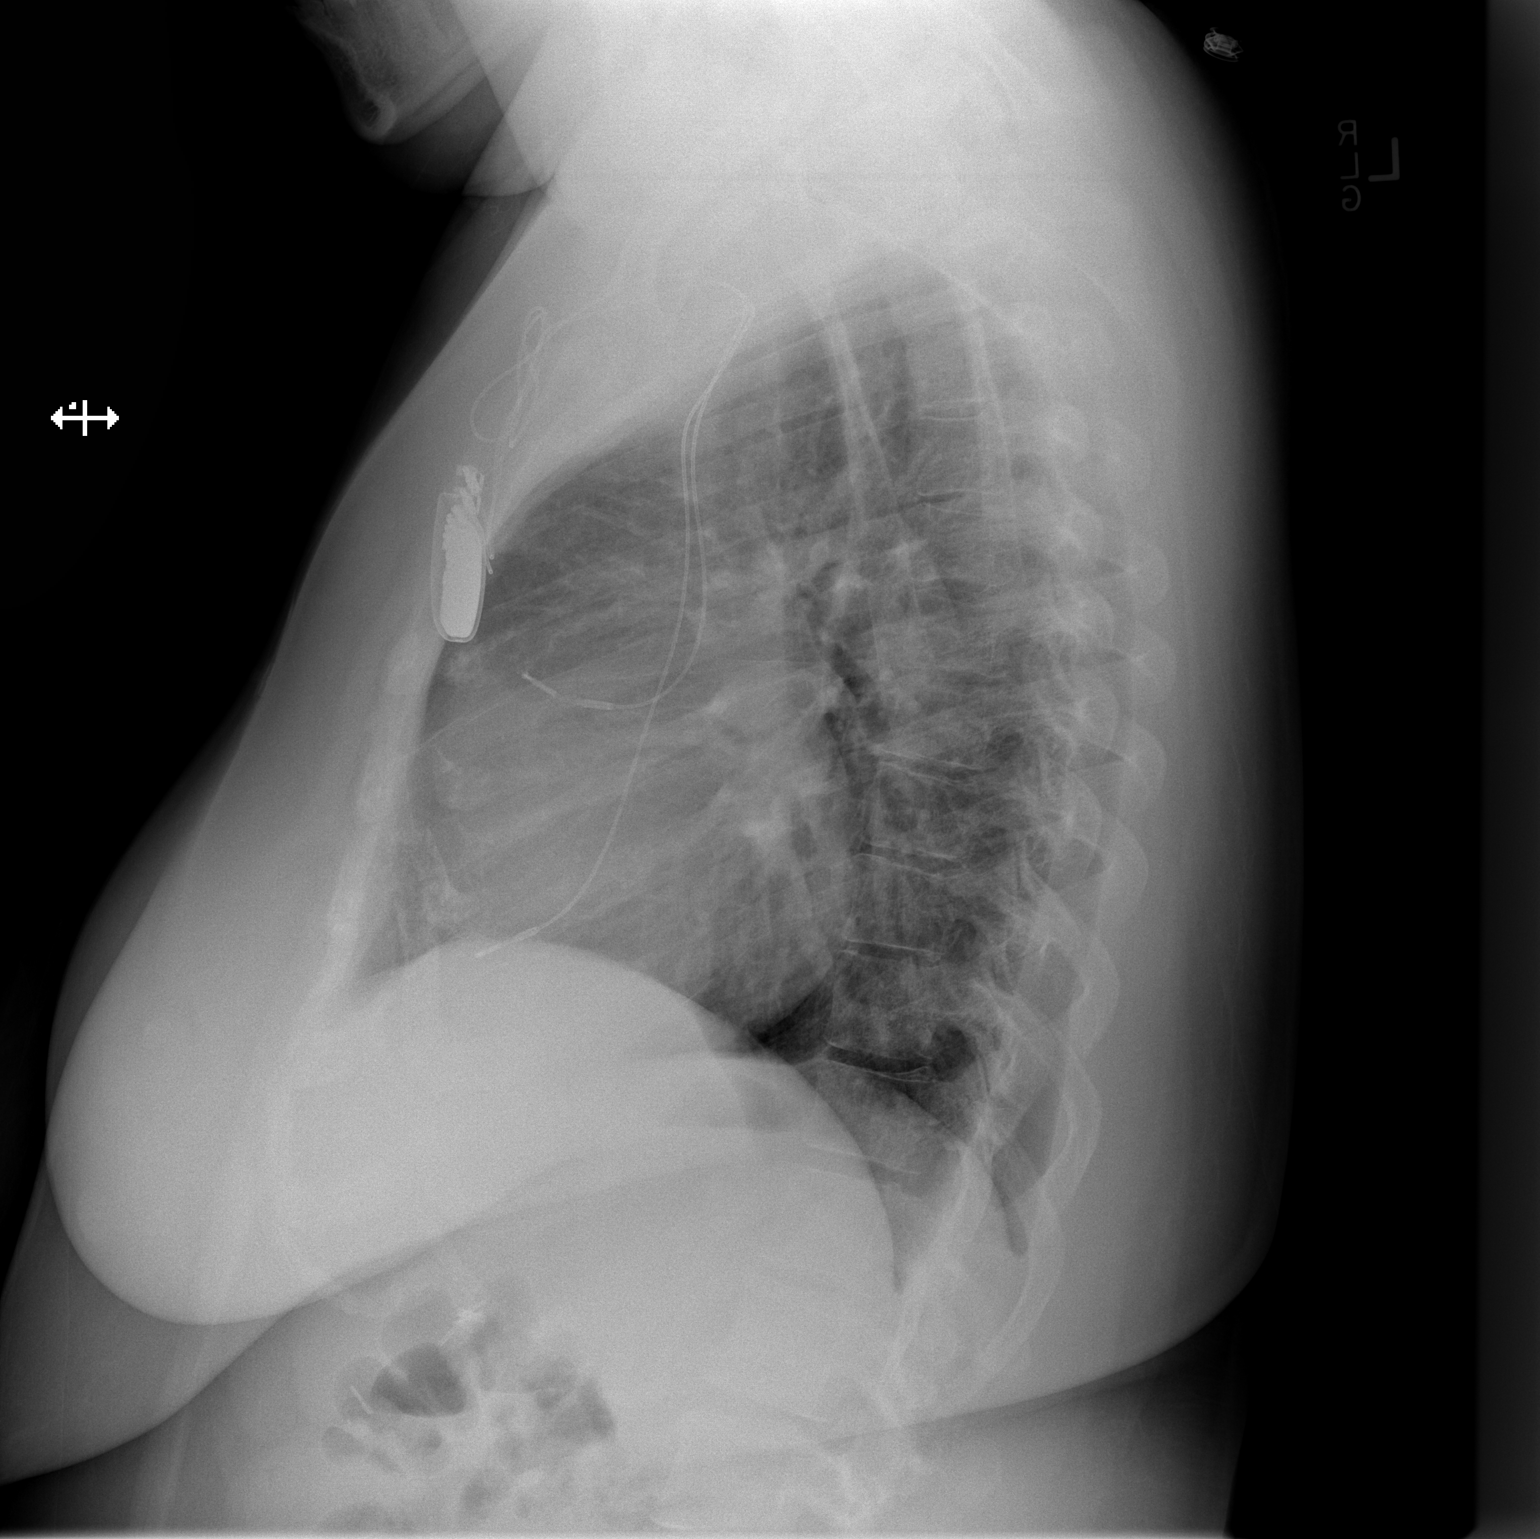

[2 of 2 positions shown; findings below may reference images not displayed]

FINDINGS: Lungs are clear.  Left chest wall battery pack with dual
leads, unchanged in position.  No pleural effusion or pneumothorax.
No acute osseous abnormality. Surgical clips right upper quadrant.
IMPRESSION: No acute process identified.

## 2013-08-23 MED ORDER — ALBUTEROL SULFATE HFA 108 (90 BASE) MCG/ACT IN AERS: 2.0000 | INHALATION_SPRAY | RESPIRATORY_TRACT | Status: DC | PRN

## 2013-08-23 NOTE — Progress Notes (Signed)
Patient ID: Makayla Vasquez, female   DOB: Apr 01, 1981, 32 y.o.   MRN: [id number] Subjective:   CC: URI  HPI:   1. URI - Patient is 32 y.o. female with h/o asthma here with 1 week of a dry cough, sneezing with green mucus, postnasal drip, sinus pressure, chills, and watery non-bloody diarrhea. Denies fevers, pain (other than some stomach aching), dyspnea, emesis, blurred vision, dizziness, syncope, chest pain, or ear drainage. Denies sick contacts or new medications. Has not eaten much in past 2 days but has been pushing fluids.  Review of Systems - Per HPI.   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 female here for URI symptoms.    Plan:     # See problem list and after visit summary for problem-specific plans.

## 2013-08-23 NOTE — Patient Instructions (Signed)
It was good to meet you. I am sorry you are not feeling well.  The clear fluid behind your ears and your symptoms point to a viral upper respiratory infection possibly with a viral GI infection as well.  - Rest - Stay hydrated with gatorade or other rehydrating fluids. - Take tylenol as needed for pain/fever. - Try losenges for symptoms.  - I am prescribing an albuterol inhaler just so you have an emergency inhaler at home. - Come back in 1-2 weeks for a flu shot - Come back in 3-4 days if symptoms worsen or are not better. - Seek immediate care if you develop high fevers or shortness of breath.  Leona Singleton, MD   Viral Infections A virus is a type of germ. Viruses can cause:  Minor sore throats.  Aches and pains.  Headaches.  Runny nose.  Rashes.  Watery eyes.  Tiredness.  Coughs.  Loss of appetite.  Feeling sick to your stomach (nausea).  Throwing up (vomiting).  Watery poop (diarrhea). HOME CARE   Only take medicines as told by your doctor.  Drink enough water and fluids to keep your pee (urine) clear or pale yellow. Sports drinks are a good choice.  Get plenty of rest and eat healthy. Soups and broths with crackers or rice are fine. GET HELP RIGHT AWAY IF:   You have a very bad headache.  You have shortness of breath.  You have chest pain or neck pain.  You have an unusual rash.  You cannot stop throwing up.  You have watery poop that does not stop.  You cannot keep fluids down.  You or your child has a temperature by mouth above 102 F (38.9 C), not controlled by medicine.  Your baby is older than 3 months with a rectal temperature of 102 F (38.9 C) or higher.  Your baby is 32 months old or younger with a rectal temperature of 100.4 F (38 C) or higher. MAKE SURE YOU:   Understand these instructions.  Will watch this condition.  Will get help right away if you are not doing well or get worse. Document Released: 09/18/2008  Document Revised: 12/29/2011 Document Reviewed: 02/11/2011 [facility] Patient Information 2014 [place], [place].

## 2013-08-26 ENCOUNTER — Encounter: Payer: Self-pay | Admitting: Family Medicine

## 2013-08-26 ENCOUNTER — Ambulatory Visit (INDEPENDENT_AMBULATORY_CARE_PROVIDER_SITE_OTHER): Payer: Medicare Other | Admitting: Family Medicine

## 2013-08-26 VITALS — BP 124/81 | HR 80 | Temp 98.1°F | Ht 66.0 in | Wt 228.0 lb

## 2013-08-26 DIAGNOSIS — J069 Acute upper respiratory infection, unspecified: Secondary | ICD-10-CM | POA: Insufficient documentation

## 2013-08-26 DIAGNOSIS — R197 Diarrhea, unspecified: Secondary | ICD-10-CM

## 2013-08-26 MED ORDER — HYOSCYAMINE SULFATE 0.125 MG PO TABS: 0.1250 mg | ORAL_TABLET | ORAL | Status: DC | PRN

## 2013-08-26 NOTE — Assessment & Plan Note (Addendum)
Symptoms most c/w viral URI - reassured - Rest, hydrate, tylenol PRN, losenges PRN - Rx'ed albuterol inhaler for patient to have on hand in case of dyspnea as asthma is listed in Problem List - Return 1-2 weeks for flu shot - If no improvement 3-5 days, re-evaluate for need for abx coverage with sx >7 days. - Return precautions reviewed

## 2013-08-26 NOTE — Progress Notes (Signed)
Patient ID: AYDIA MAJ, female   DOB: 07/03/1981, 32 y.o.   MRN: [id number]  [facility] Amber M. Hairford, MD Phone: [phone]   Subjective: HPI: Patient is a 32 y.o. female presenting to clinic today for same day appointment to follow up diarrhea.  Diarrhea Patient complains of diarrhea. Onset of diarrhea was 10 days ago. Diarrhea is occurring approximately 10 times per day. Patient describes diarrhea as watery. Diarrhea has been associated with abdominal pain described as cramping, comes and goes. Patient denies blood in stool, fever, illness in household contacts, recent travel. Previous visits for diarrhea: yes, last seen 3 days ago by another provider in this clinic. Evaluation to date: none. Treatment to date: None. Diarrhea is worsened by eating. 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 No exposure to children.  Of note, patient had wrist surgery 1 month ago. Did not have long term antibiotics, but likely had intraoperative antibiotics.   History Reviewed: Non-smoker. Health Maintenance: Has not had flu shot  ROS: Please see HPI above.  Objective: Office vital signs reviewed. BP 124/81  Pulse 80  Temp(Src) 98.1 F (36.7 C) (Oral)  Ht 5\' 6"  (1.676 m)  Wt 228 lb (103.42 kg)  BMI 36.82 kg/m2  Physical Examination:  General: Awake, alert. NAD HEENT: Atraumatic, normocephalic Neck: No masses palpated. No LAD Pulm: CTAB, no wheezes Cardio: RRR, no murmurs appreciated Abdomen:+BS, soft, nontender, nondistended Extremities: No edema Neuro: Grossly intact  Assessment: 32 y.o. female with diarrhea  Plan: See Problem List and After Visit Summary

## 2013-08-26 NOTE — Patient Instructions (Signed)
I am sorry you are still having pain and diarrhea.  I have sent in a medication for you to take as needed for stomach cramping.  I would not take any anti-diarrhea medications until we get your lab results back.  I will call you Monday with your lab results.  Loralie Malta M. Tessy Pawelski, M.D.

## 2013-08-27 DIAGNOSIS — R197 Diarrhea, unspecified: Secondary | ICD-10-CM | POA: Insufficient documentation

## 2013-08-27 NOTE — Assessment & Plan Note (Addendum)
Patient with >1 week of persistent diarrhea. She has a history of constipation and IBS on her problem list, so this could be a component of that. 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 Patient advised to avoid antidiarrhea medications until these results return. Given hyoscamine for cramping/bloating to use every 4-6 hours as needed. Will call with results, and at that time she can start over the counter immodium if results are negative and she is still having diarrhea. Encouraged to stay well hydrated even if not eating. F/u as needed.

## 2013-08-29 ENCOUNTER — Telehealth: Payer: Self-pay

## 2013-08-29 NOTE — Telephone Encounter (Signed)
Patient calling for lab results from [facility] on 11/7. Please call patient.

## 2013-08-29 NOTE — Telephone Encounter (Signed)
Pt is aware of results. Estellar Cadena,CMA  

## 2013-08-29 NOTE — Telephone Encounter (Signed)
I just wrote this on the lab result, but could you please let patient know that her C.diff was negative and her stool culture does not look like it is going to grow anything. She can take antidiarrhea medication if needed for symptoms.  Thanks, [employer]. Hairford, M.D.

## 2013-08-30 LAB — STOOL CULTURE

## 2013-09-07 ENCOUNTER — Ambulatory Visit: Payer: Medicare Other | Admitting: Emergency Medicine

## 2013-10-17 ENCOUNTER — Encounter (HOSPITAL_COMMUNITY): Payer: Self-pay | Admitting: Emergency Medicine

## 2013-10-17 ENCOUNTER — Emergency Department (HOSPITAL_COMMUNITY)
Admission: EM | Admit: 2013-10-17 | Discharge: 2013-10-17 | Disposition: A | Discharge status: Home / Self Care | Payer: Medicare Other | Attending: Emergency Medicine | Admitting: Emergency Medicine

## 2013-10-17 DIAGNOSIS — R269 Unspecified abnormalities of gait and mobility: Secondary | ICD-10-CM | POA: Insufficient documentation

## 2013-10-17 DIAGNOSIS — D649 Anemia, unspecified: Secondary | ICD-10-CM | POA: Insufficient documentation

## 2013-10-17 DIAGNOSIS — R569 Unspecified convulsions: Secondary | ICD-10-CM | POA: Insufficient documentation

## 2013-10-17 DIAGNOSIS — E669 Obesity, unspecified: Secondary | ICD-10-CM | POA: Insufficient documentation

## 2013-10-17 DIAGNOSIS — M545 Low back pain, unspecified: Secondary | ICD-10-CM | POA: Insufficient documentation

## 2013-10-17 DIAGNOSIS — Z950 Presence of cardiac pacemaker: Secondary | ICD-10-CM | POA: Insufficient documentation

## 2013-10-17 DIAGNOSIS — M129 Arthropathy, unspecified: Secondary | ICD-10-CM | POA: Insufficient documentation

## 2013-10-17 DIAGNOSIS — J309 Allergic rhinitis, unspecified: Secondary | ICD-10-CM | POA: Insufficient documentation

## 2013-10-17 DIAGNOSIS — Z87891 Personal history of nicotine dependence: Secondary | ICD-10-CM | POA: Insufficient documentation

## 2013-10-17 DIAGNOSIS — M25559 Pain in unspecified hip: Secondary | ICD-10-CM | POA: Insufficient documentation

## 2013-10-17 DIAGNOSIS — Z3202 Encounter for pregnancy test, result negative: Secondary | ICD-10-CM | POA: Insufficient documentation

## 2013-10-17 DIAGNOSIS — F411 Generalized anxiety disorder: Secondary | ICD-10-CM | POA: Insufficient documentation

## 2013-10-17 DIAGNOSIS — Z95810 Presence of automatic (implantable) cardiac defibrillator: Secondary | ICD-10-CM | POA: Insufficient documentation

## 2013-10-17 DIAGNOSIS — K219 Gastro-esophageal reflux disease without esophagitis: Secondary | ICD-10-CM | POA: Insufficient documentation

## 2013-10-17 DIAGNOSIS — Z79899 Other long term (current) drug therapy: Secondary | ICD-10-CM | POA: Insufficient documentation

## 2013-10-17 DIAGNOSIS — IMO0001 Reserved for inherently not codable concepts without codable children: Secondary | ICD-10-CM | POA: Insufficient documentation

## 2013-10-17 DIAGNOSIS — Z8679 Personal history of other diseases of the circulatory system: Secondary | ICD-10-CM | POA: Insufficient documentation

## 2013-10-17 DIAGNOSIS — J3489 Other specified disorders of nose and nasal sinuses: Secondary | ICD-10-CM | POA: Insufficient documentation

## 2013-10-17 DIAGNOSIS — Z8719 Personal history of other diseases of the digestive system: Secondary | ICD-10-CM | POA: Insufficient documentation

## 2013-10-17 DIAGNOSIS — R05 Cough: Secondary | ICD-10-CM | POA: Insufficient documentation

## 2013-10-17 DIAGNOSIS — F319 Bipolar disorder, unspecified: Secondary | ICD-10-CM | POA: Insufficient documentation

## 2013-10-17 DIAGNOSIS — J45909 Unspecified asthma, uncomplicated: Secondary | ICD-10-CM | POA: Insufficient documentation

## 2013-10-17 DIAGNOSIS — I509 Heart failure, unspecified: Secondary | ICD-10-CM | POA: Insufficient documentation

## 2013-10-17 DIAGNOSIS — R059 Cough, unspecified: Secondary | ICD-10-CM | POA: Insufficient documentation

## 2013-10-17 DIAGNOSIS — M549 Dorsalgia, unspecified: Secondary | ICD-10-CM

## 2013-10-17 DIAGNOSIS — Z8739 Personal history of other diseases of the musculoskeletal system and connective tissue: Secondary | ICD-10-CM | POA: Insufficient documentation

## 2013-10-17 LAB — URINALYSIS, ROUTINE W REFLEX MICROSCOPIC
Hgb urine dipstick: NEGATIVE
Specific Gravity, Urine: 1.026 (ref 1.005–1.030)
Urobilinogen, UA: 1 mg/dL (ref 0.0–1.0)

## 2013-10-17 LAB — PREGNANCY, URINE: Preg Test, Ur: NEGATIVE

## 2013-10-17 MED ORDER — HYDROCODONE-ACETAMINOPHEN 5-325 MG PO TABS: 1.0000 | ORAL_TABLET | Freq: Four times a day (QID) | ORAL | Status: DC | PRN

## 2013-10-17 NOTE — ED Notes (Signed)
Pt states she has been having lower back pain that radiates to right side for  X 3 days pt states pain is constant and has not gotten better.

## 2013-10-17 NOTE — ED Notes (Addendum)
Back pain that rads to hips no injury just woke up w/ the pain 3 days ago  No n/v pt is able to wsalk well

## 2013-10-17 NOTE — ED Notes (Signed)
PA at bedside.

## 2013-10-17 NOTE — ED Provider Notes (Signed)
CSN: [id number]     Arrival date & time 10/17/13  1716 History   This chart was scribed for non-physician practitioner Raymon Mutton, PA-C, working with Doug Sou, MD, by Yevette Edwards, ED Scribe. This patient was seen in room TR09C/TR09C and the patient's care was started at 10:14 PM.  First MD Initiated Contact with Patient 10/17/13 2002     Chief Complaint  Patient presents with  . Back Pain   The history is provided by the patient. No language interpreter was used.   HPI Comments: Makayla Vasquez is a 32 y.o. female, with a h/o CHF, who presents to the Emergency Department complaining of lower back pain which radiates to her hips bilaterally and which began three days ago. The pt characterizes the pain as "sharp" and she reports it is intermittent. She states the pain is increased with ambulation. She denies any recent injury, heaving lifting, or sudden motions which could have precipitated the pain. She has also experienced a cough and nasal congestion. The pt has treated her pain with Tylenol, but without resolution. She denies dysuria, hematuria, urinary incontinence, bowel incontinence chest pain, SOB, numbness/paresthesia to her extremities, or a fever. She denies a h/o similar back pain. The pt is a former smoker.   Past Medical History  Diagnosis Date  . Heart palpitations   . Cardiac pacemaker     Since age 63. Last generator change in 2004  . Congenital complete AV block   . Obesity   . GERD (gastroesophageal reflux disease)   . Asymptomatic LV dysfunction     Echo in Dec 2011 with EF 35 to 40%. Felt to be due to paced rhythm  . Seizures     as a child- from high fever  . Anxiety   . Bipolar affective disorder   . Depression     bipolar  . Pacemaker     4-chamber  . Heart murmur     when I was little  . Carpal tunnel syndrome of right wrist   . Constipation   . Anemia     after child birth was put on Iron.  . Seasonal allergies   . CHF (congestive heart  failure)     R/T 4-chamber pacemaker  . Automatic implantable cardioverter-defibrillator in situ     medtronic pacemaker   . Asthma     seasonal allergies   . Arthritis     rheumatoid arthritis- mild, no rheumatology care    Past Surgical History  Procedure Laterality Date  . Throat surgery  1994    s/p laser treatment  . Cesarean section    . Insert / replace / remove pacemaker      2001  . Cholecystectomy    . Iud removal  11/03/2011    Procedure: INTRAUTERINE DEVICE (IUD) REMOVAL;  Surgeon: Myra C. Marice Potter, MD;  Location: [facility];  Service: Gynecology;  Laterality: N/A;  . Cyst excision  12/10/2012    THYROID  . Thyroglossal duct cyst N/A 12/10/2012    Procedure: REVISION OF THYROGLOSSAL DUCT CYST EXCISION;  Surgeon: Serena Colonel, MD;  Location: [facility] OR;  Service: ENT;  Laterality: N/A;  Revision of Thyroglossal Duct Cyst Excision  . Nasal fracture surgery      /w plate   . Carpal tunnel with cubital tunnel Right 07/26/2013    Procedure: RIGHT LIMITED OPEN CARPAL TUNNEL RELEASE ,  RIGHT CUBITAL TUNNEL RELEASE, INSITU VERSES ULNAR NERVE DECOMPRESSION AND ANTERIOR TRANSPOSITION;  Surgeon: Dominica Severin, MD;  Location: [facility] OR;  Service: Orthopedics;  Laterality: Right;   Family History  Problem Relation Age of Onset  . Heart disease Mother   . Hypertension Mother   . Heart disease Father   . Heart disease Sister     cardiomegaly and atherosclerosis   History  Substance Use Topics  . Smoking status: Former Smoker -- .5 years    Types: Cigarettes    Quit date: 07/25/1996  . Smokeless tobacco: Never Used  . Alcohol Use: No   OB History   Grav Para Term Preterm Abortions TAB SAB Ect Mult Living   1 1 1       1      Review of Systems  Constitutional: Negative for fever.  HENT: Positive for congestion.   Respiratory: Positive for cough. Negative for shortness of breath.   Cardiovascular: Negative for chest pain.  Genitourinary: Negative for dysuria, urgency and hematuria.   Musculoskeletal: Positive for back pain, gait problem and myalgias.  Neurological: Negative for weakness and numbness.  All other systems reviewed and are negative.   Allergies  Sertraline hcl  Home Medications   Current Outpatient Rx  Name  Route  Sig  Dispense  Refill  . albuterol (PROVENTIL HFA;VENTOLIN HFA) 108 (90 BASE) MCG/ACT inhaler   Inhalation   Inhale 2 puffs into the lungs every 4 (four) hours as needed for wheezing or shortness of breath.   1 each   0    Triage Vitals: BP 157/77  Pulse 60  Temp(Src) 97.7 F (36.5 C) (Oral)  Resp 16  Wt 232 lb 14.4 oz (105.643 kg)  SpO2 100%  Physical Exam  Nursing note and vitals reviewed. Constitutional: She is oriented to person, place, and time. She appears well-developed and well-nourished. No distress.  HENT:  Head: Normocephalic and atraumatic.  Mouth/Throat: Oropharynx is clear and moist. No oropharyngeal exudate.  Eyes: Conjunctivae and EOM are normal. Pupils are equal, round, and reactive to light. Right eye exhibits no discharge. Left eye exhibits no discharge.  Neck: Normal range of motion. Neck supple.  Negative neck stiffness Negative nuchal rigidity Negative pain upon palpation to the C-spine  Cardiovascular: Normal rate, regular rhythm and normal heart sounds.   Pulses:      Radial pulses are 2+ on the right side, and 2+ on the left side.       Dorsalis pedis pulses are 2+ on the right side, and 2+ on the left side.  Pulmonary/Chest: Effort normal and breath sounds normal. No respiratory distress. She has no wheezes. She has no rales.  Abdominal:  Obese  Musculoskeletal: Normal range of motion. She exhibits tenderness.       Back:  Negative swelling, erythema, inflammation, lesions, sores, deformities noted to the cervical, thoracic, lumbosacral spine. Discomfort upon palpation to the lumbosacral spine mid spinal and paravertebral bilaterally. Full range of motion to upper lower tremors bilaterally without  difficulty noted or ataxia.  Lymphadenopathy:    She has no cervical adenopathy.  Neurological: She is alert and oriented to person, place, and time. No cranial nerve deficit. She exhibits normal muscle tone. Coordination normal.  Cranial nerves III-XII grossly intact Strength 5+/5+ to upper and lower extremities bilaterally with resistance applied, equal distribution noted Patient is able to walk heel to toe and onto the toes without difficulty Gait proper, proper balance - negative sway, negative drift, negative step-offs    Skin: Skin is warm and dry.  Psychiatric: She has a normal mood and affect. Her behavior  is normal.    ED Course  Procedures (including critical care time)  DIAGNOSTIC STUDIES: Oxygen Saturation is 100% on room air, normal by my interpretation.    COORDINATION OF CARE:  10:20 PM- Discussed treatment plan with patient, and the patient agreed to the plan.   Labs Review Labs Reviewed - No data to display Imaging Review No results found.  EKG Interpretation   None       MDM  No diagnosis found.  Filed Vitals:   10/17/13 1726  BP: 157/77  Pulse: 60  Temp: 97.7 F (36.5 C)  TempSrc: Oral  Resp: 16  Weight: 232 lb 14.4 oz (105.643 kg)  SpO2: 100%   I personally performed the services described in this documentation, which was scribed in my presence. The recorded information has been reviewed and is accurate.  Patient presenting to emergency department with back pain localize the lower back-lumbosacral region is been ongoing for the past 3 days. Describes a sharp shooting pain that radiates to her pelvic region. Alert and oriented. GCS 15. Heart rate and rhythm normal. Pulses palpable and strong, radial and DP 2+ bilaterally. Lungs clear to auscultation. Full range of motion to upper lower tremors bilaterally without difficulty noted or ataxia. Negative deformities noted to the cervical, thoracic, lumbosacral spine. Discomfort upon palpation to mid  spinal and bilateral paravertebral regions of the lumbosacral spine-muscular nature. Full range of motion to lower extremities without difficulty. Strength intact with equal distribution. Sensation intact with differentiation to sharp and dull touch. Negative focal neurological deficits identified. Urinalysis negative for infection. Urine pregnancy negative. Doubt urinary tract infection. Doubt kidney stones. Doubt cauda equina. Doubt epidural abscess. Suspicion to be muscular discomfort do to pain elevation with motion and palpation. Patient stable, afebrile. Discharged patient. Discharged patient with small dose of pain medications discussed course, precautions, disposal. Referred patient to urgent care Center and orthopedics. Discussed with patient to rest, ice, massage, icy hot ointment. Discussed with patient to avoid any physical or strenuous activity. Discussed with patient to closely monitor symptoms and if symptoms are to worsen or change to report back to the ED - strict return instructions given.  Patient agreed to plan of care, understood, all questions answered.     Raymon Mutton, PA-C 10/19/13 1241

## 2013-10-19 NOTE — ED Provider Notes (Signed)
Medical screening examination/treatment/procedure(s) were performed by non-physician practitioner and as supervising physician I was immediately available for consultation/collaboration.  EKG Interpretation   None        Avayah Raffety, MD 10/19/13 2034 

## 2013-10-24 ENCOUNTER — Ambulatory Visit: Payer: Medicare Other | Admitting: Emergency Medicine

## 2013-10-28 ENCOUNTER — Encounter: Payer: Self-pay | Admitting: Internal Medicine

## 2013-10-28 ENCOUNTER — Ambulatory Visit (INDEPENDENT_AMBULATORY_CARE_PROVIDER_SITE_OTHER): Payer: Medicare Other | Admitting: *Deleted

## 2013-10-28 ENCOUNTER — Ambulatory Visit (INDEPENDENT_AMBULATORY_CARE_PROVIDER_SITE_OTHER): Payer: Medicare Other | Admitting: Internal Medicine

## 2013-10-28 VITALS — BP 124/78 | HR 76 | Ht 66.0 in | Wt 236.0 lb

## 2013-10-28 DIAGNOSIS — Z23 Encounter for immunization: Secondary | ICD-10-CM | POA: Diagnosis not present

## 2013-10-28 DIAGNOSIS — Q246 Congenital heart block: Secondary | ICD-10-CM

## 2013-10-28 DIAGNOSIS — I5022 Chronic systolic (congestive) heart failure: Secondary | ICD-10-CM

## 2013-10-28 DIAGNOSIS — I10 Essential (primary) hypertension: Secondary | ICD-10-CM

## 2013-10-28 DIAGNOSIS — Z950 Presence of cardiac pacemaker: Secondary | ICD-10-CM

## 2013-10-28 DIAGNOSIS — I499 Cardiac arrhythmia, unspecified: Secondary | ICD-10-CM | POA: Diagnosis not present

## 2013-10-28 LAB — MDC_IDC_ENUM_SESS_TYPE_INCLINIC
Battery Remaining Longevity: 58 mo
Battery Voltage: 3 V
Brady Statistic AP VP Percent: 14.01 %
Brady Statistic RV Percent Paced: 99.58 %
Lead Channel Impedance Value: 361 Ohm
Lead Channel Impedance Value: 380 Ohm
Lead Channel Impedance Value: 437 Ohm
Lead Channel Impedance Value: 684 Ohm
Lead Channel Pacing Threshold Amplitude: 1.75 V
Lead Channel Sensing Intrinsic Amplitude: 4.5 mV
Lead Channel Sensing Intrinsic Amplitude: 5 mV
Lead Channel Setting Pacing Amplitude: 2 V
Lead Channel Setting Pacing Amplitude: 2.5 V
Lead Channel Setting Pacing Pulse Width: 0.6 ms
MDC IDC MSMT LEADCHNL LV IMPEDANCE VALUE: 399 Ohm
MDC IDC MSMT LEADCHNL LV IMPEDANCE VALUE: 456 Ohm
MDC IDC MSMT LEADCHNL LV IMPEDANCE VALUE: 475 Ohm
MDC IDC MSMT LEADCHNL LV PACING THRESHOLD AMPLITUDE: 0.875 V
MDC IDC MSMT LEADCHNL LV PACING THRESHOLD PULSEWIDTH: 0.4 ms
MDC IDC MSMT LEADCHNL RA PACING THRESHOLD AMPLITUDE: 0.625 V
MDC IDC MSMT LEADCHNL RA PACING THRESHOLD PULSEWIDTH: 0.4 ms
MDC IDC MSMT LEADCHNL RA SENSING INTR AMPL: 4.625 mV
MDC IDC MSMT LEADCHNL RV IMPEDANCE VALUE: 247 Ohm
MDC IDC MSMT LEADCHNL RV IMPEDANCE VALUE: 266 Ohm
MDC IDC MSMT LEADCHNL RV PACING THRESHOLD PULSEWIDTH: 0.4 ms
MDC IDC MSMT LEADCHNL RV SENSING INTR AMPL: 4.75 mV
MDC IDC SESS DTM: 20150109150209
MDC IDC SET LEADCHNL LV PACING PULSEWIDTH: 0.4 ms
MDC IDC SET LEADCHNL RA PACING AMPLITUDE: 2 V
MDC IDC SET LEADCHNL RV SENSING SENSITIVITY: 0.9 mV
MDC IDC SET ZONE DETECTION INTERVAL: 400 ms
MDC IDC STAT BRADY AP VS PERCENT: 0.01 %
MDC IDC STAT BRADY AS VP PERCENT: 85.56 %
MDC IDC STAT BRADY AS VS PERCENT: 0.41 %
MDC IDC STAT BRADY RA PERCENT PACED: 14.03 %
Zone Setting Detection Interval: 330 ms

## 2013-10-28 NOTE — Assessment & Plan Note (Signed)
Her Medtronic biventricular pacemaker is working normally. We will program the device today to maximize battery longevity. I'll see her back in a year, she will return to our clinic in several months.

## 2013-10-28 NOTE — Assessment & Plan Note (Signed)
Her blood pressure is well controlled. She will continue her current medication. She is encouraged to maintain a low-sodium diet.

## 2013-10-28 NOTE — Patient Instructions (Signed)
Your physician recommends that you continue on your current medications as directed. Please refer to the Current Medication list given to you today.  Your physician wants you to follow-up in: 6 months with device clinic.  You will receive a reminder letter in the mail two months in advance. If you don't receive a letter, please call our office to schedule the follow-up appointment.  Your physician wants you to follow-up in: 1 year with Dr. Taylor. You will receive a reminder letter in the mail two months in advance. If you don't receive a letter, please call our office to schedule the follow-up appointment.  

## 2013-10-28 NOTE — Progress Notes (Signed)
HPI Makayla Vasquez returns today for followup. She is a very pleasant 33 year old woman with a history of congenital complete heart block, status post pacemaker insertion. She developed worsening left ventricular dysfunction and at the time of her most recent generator change, underwent upgrade to a biventricular pacemaker. In the interim she has done well. Her only complaint is frustration over her inability to lose weight. No syncope, no palpitations, and no chest pain. Currently, her heart failure symptoms are class I. Allergies  Allergen Reactions  . Sertraline Hcl Hives     Current Outpatient Prescriptions  Medication Sig Dispense Refill  . albuterol (PROVENTIL HFA;VENTOLIN HFA) 108 (90 BASE) MCG/ACT inhaler Inhale 2 puffs into the lungs every 4 (four) hours as needed for wheezing or shortness of breath.  1 each  0  . HYDROcodone-acetaminophen (NORCO/VICODIN) 5-325 MG per tablet Take 1 tablet by mouth every 6 (six) hours as needed.  5 tablet  0  . [DISCONTINUED] cetirizine (ZYRTEC) 10 MG tablet Take 1 tablet (10 mg total) by mouth daily.  30 tablet  11   No current facility-administered medications for this visit.     Past Medical History  Diagnosis Date  . Heart palpitations   . Cardiac pacemaker     Since age 34. Last generator change in 2004  . Congenital complete AV block   . Obesity   . GERD (gastroesophageal reflux disease)   . Asymptomatic LV dysfunction     Echo in Dec 2011 with EF 35 to 40%. Felt to be due to paced rhythm  . Seizures     as a child- from high fever  . Anxiety   . Bipolar affective disorder   . Depression     bipolar  . Pacemaker     4-chamber  . Heart murmur     when I was little  . Carpal tunnel syndrome of right wrist   . Constipation   . Anemia     after child birth was put on Iron.  . Seasonal allergies   . CHF (congestive heart failure)     R/T 4-chamber pacemaker  . Automatic implantable cardioverter-defibrillator in situ     medtronic  pacemaker   . Asthma     seasonal allergies   . Arthritis     rheumatoid arthritis- mild, no rheumatology care     ROS:   All systems reviewed and negative except as noted in the HPI.   Past Surgical History  Procedure Laterality Date  . Throat surgery  1994    s/p laser treatment  . Cesarean section    . Insert / replace / remove pacemaker      2001  . Cholecystectomy    . Iud removal  11/03/2011    Procedure: INTRAUTERINE DEVICE (IUD) REMOVAL;  Surgeon: Myra C. Marice Potter, MD;  Location: [facility];  Service: Gynecology;  Laterality: N/A;  . Cyst excision  12/10/2012    THYROID  . Thyroglossal duct cyst N/A 12/10/2012    Procedure: REVISION OF THYROGLOSSAL DUCT CYST EXCISION;  Surgeon: Serena Colonel, MD;  Location: [facility] OR;  Service: ENT;  Laterality: N/A;  Revision of Thyroglossal Duct Cyst Excision  . Nasal fracture surgery      /w plate   . Carpal tunnel with cubital tunnel Right 07/26/2013    Procedure: RIGHT LIMITED OPEN CARPAL TUNNEL RELEASE ,  RIGHT CUBITAL TUNNEL RELEASE, INSITU VERSES ULNAR NERVE DECOMPRESSION AND ANTERIOR TRANSPOSITION;  Surgeon: Dominica Severin, MD;  Location: [facility] OR;  Service:  Orthopedics;  Laterality: Right;     Family History  Problem Relation Age of Onset  . Heart disease Mother   . Hypertension Mother   . Heart disease Father   . Heart disease Sister     cardiomegaly and atherosclerosis     History   Social History  . Marital Status: Single    Spouse Name: N/A    Number of Children: 1  . Years of Education: N/A   Occupational History  . CNA    Social History Main Topics  . Smoking status: Former Smoker -- .5 years    Types: Cigarettes    Quit date: 07/25/1996  . Smokeless tobacco: Never Used  . Alcohol Use: No  . Drug Use: No  . Sexual Activity: Yes    Birth Control/ Protection: Implant     Comment: placed 04/2011   Other Topics Concern  . Not on file   Social History Narrative  . No narrative on file     BP 124/78  Pulse 76  Ht  5\' 6"  (1.676 m)  Wt 236 lb (107.049 kg)  BMI 38.11 kg/m2  Physical Exam:  Well appearing obese, 33 year old woman, NAD HEENT: Unremarkable Neck:  No JVD, no thyromegally Lungs:  Clear with no wheezes, rales, or rhonchi. Well-healed pacemaker incision. HEART:  Regular rate rhythm, no murmurs, no rubs, no clicks Abd:  soft, positive bowel sounds, no organomegally, no rebound, no guarding Ext:  2 plus pulses, no edema, no cyanosis, no clubbing Skin:  No rashes no nodules Neuro:  CN II through XII intact, motor grossly intact  DEVICE  Normal device function.  See PaceArt for details.   Assess/Plan:

## 2013-10-28 NOTE — Assessment & Plan Note (Signed)
Her chronic systolic heart failure is well compensated and class I. She'll continue her current medications.

## 2013-12-01 ENCOUNTER — Encounter: Payer: Self-pay | Admitting: Emergency Medicine

## 2013-12-01 ENCOUNTER — Ambulatory Visit (INDEPENDENT_AMBULATORY_CARE_PROVIDER_SITE_OTHER): Payer: Medicare Other | Admitting: Emergency Medicine

## 2013-12-01 VITALS — BP 120/70 | Temp 98.5°F | Ht 66.0 in | Wt 238.0 lb

## 2013-12-01 DIAGNOSIS — L293 Anogenital pruritus, unspecified: Secondary | ICD-10-CM

## 2013-12-01 DIAGNOSIS — J019 Acute sinusitis, unspecified: Secondary | ICD-10-CM | POA: Insufficient documentation

## 2013-12-01 DIAGNOSIS — R300 Dysuria: Secondary | ICD-10-CM

## 2013-12-01 DIAGNOSIS — N898 Other specified noninflammatory disorders of vagina: Secondary | ICD-10-CM | POA: Insufficient documentation

## 2013-12-01 LAB — POCT URINALYSIS DIPSTICK
Bilirubin, UA: NEGATIVE
Blood, UA: NEGATIVE
Glucose, UA: NEGATIVE
KETONES UA: NEGATIVE
Leukocytes, UA: NEGATIVE
Nitrite, UA: NEGATIVE
PH UA: 6
Protein, UA: 30
Spec Grav, UA: >=1.03
UROBILINOGEN UA: 0.2

## 2013-12-01 LAB — POCT UA - MICROSCOPIC ONLY

## 2013-12-01 MED ORDER — FLUCONAZOLE 150 MG PO TABS: 150.0000 mg | ORAL_TABLET | Freq: Once | ORAL | Status: DC

## 2013-12-01 MED ORDER — FLUTICASONE PROPIONATE 50 MCG/ACT NA SUSP: 2.0000 | Freq: Every day | NASAL | Status: DC

## 2013-12-01 MED ORDER — AMOXICILLIN-POT CLAVULANATE 875-125 MG PO TABS: 1.0000 | ORAL_TABLET | Freq: Two times a day (BID) | ORAL | Status: DC

## 2013-12-01 NOTE — Assessment & Plan Note (Signed)
Irritation vs yeast infection. Will treat with diflucan 150mg  x1. F/u if itching persists after treatment.

## 2013-12-01 NOTE — Progress Notes (Signed)
   Subjective:    Patient ID: Makayla Vasquez, female    DOB: [birth year], 33 y.o.   MRN: [id number]  HPI Makayla Vasquez is here for a SDA for sinus issue and vaginal itching.  Sinus issue She reports post-nasal drip and lots of nasal discharge for the last 7-10 days.  Nasal discharge is greenish and has blood in it.  Also with sinus pressure.  No fevers.  Reports that she has chronic issues with sinuses.  Vaginal itching Present for few days.  Thinks she may have used a different soap recently.  Denies any dysuria or vaginal discharge.  Just very itchy.  Current Outpatient Prescriptions on File Prior to Visit  Medication Sig Dispense Refill  . albuterol (PROVENTIL HFA;VENTOLIN HFA) 108 (90 BASE) MCG/ACT inhaler Inhale 2 puffs into the lungs every 4 (four) hours as needed for wheezing or shortness of breath.  1 each  0  . [DISCONTINUED] cetirizine (ZYRTEC) 10 MG tablet Take 1 tablet (10 mg total) by mouth daily.  30 tablet  11   No current facility-administered medications on file prior to visit.    I have reviewed and updated the following as appropriate: allergies and current medications SHx: former smoker   Review of Systems See HPI    Objective:   Physical Exam BP 120/70  Temp(Src) 98.5 F (36.9 C) (Oral)  Ht 5\' 6"  (1.676 m)  Wt 238 lb (107.956 kg)  BMI 38.43 kg/m2 Gen: alert, cooperative, NAD HEENT: At/[place], sclera white, MMM, mild cobblestoning, no exudates; nasal mucosa is erythematous with pus like drainage; +maxillary tenderness Neck: no LAD, supple Pulm: CTAB, no wheezes or rales      Assessment & Plan:

## 2013-12-01 NOTE — Patient Instructions (Signed)
It was nice to see you!  Take Augmentin 1 pill twice a day for 10 days for the sinuses. When you finish the antibiotics, start using Flonase - 2 sprays each nostril daily.  Take the diflucan for the itching AFTER you finish the augmentin.  If the itching persists after taking the medication, come back so we can recheck you.

## 2013-12-01 NOTE — Assessment & Plan Note (Signed)
Based on history and exam. Augmentin BID x10 days. Start Flonase. F/u prn.

## 2014-01-12 ENCOUNTER — Telehealth: Payer: Self-pay | Admitting: Emergency Medicine

## 2014-01-12 DIAGNOSIS — J302 Other seasonal allergic rhinitis: Secondary | ICD-10-CM

## 2014-01-12 NOTE — Telephone Encounter (Signed)
Patient needs a new referral to [facility]. SHe is already established there but cannot get an appt until she has a referral.

## 2014-01-12 NOTE — Telephone Encounter (Signed)
Forward to [employer] to schedule referral.Makayla Vasquez, Makayla Vasquez

## 2014-01-12 NOTE — Telephone Encounter (Signed)
Referral to [facility] placed.

## 2014-01-12 NOTE — Telephone Encounter (Signed)
Forward to PCP for referral.Makayla Vasquez  

## 2014-01-13 ENCOUNTER — Telehealth: Payer: Self-pay | Admitting: Emergency Medicine

## 2014-01-13 NOTE — Telephone Encounter (Signed)
Referral placed yesterday and forwarded to [employer] for scheduling.

## 2014-01-13 NOTE — Telephone Encounter (Signed)
Will FWD to MD.  Brylei Pedley L, CMA  

## 2014-01-13 NOTE — Telephone Encounter (Signed)
Patient is wanting a referral to [facility] at [facility] because of her allergies.  She says that she has been seen there before.

## 2014-01-13 NOTE — Telephone Encounter (Signed)
Referral was processed [facility] will contact pt.  [facility]

## 2014-01-18 ENCOUNTER — Ambulatory Visit (INDEPENDENT_AMBULATORY_CARE_PROVIDER_SITE_OTHER): Payer: Medicare Other | Admitting: Emergency Medicine

## 2014-01-18 ENCOUNTER — Encounter: Payer: Self-pay | Admitting: Emergency Medicine

## 2014-01-18 VITALS — BP 121/60 | HR 60 | Temp 97.9°F | Ht 66.0 in | Wt 241.0 lb

## 2014-01-18 DIAGNOSIS — N644 Mastodynia: Secondary | ICD-10-CM | POA: Insufficient documentation

## 2014-01-18 DIAGNOSIS — E669 Obesity, unspecified: Secondary | ICD-10-CM | POA: Diagnosis not present

## 2014-01-18 MED ORDER — CARVEDILOL 3.125 MG PO TABS
3.1250 mg | ORAL_TABLET | Freq: Two times a day (BID) | ORAL | Status: DC
Start: 2014-01-18 — End: 2014-06-02

## 2014-01-18 MED ORDER — METFORMIN HCL 500 MG PO TABS
500.0000 mg | ORAL_TABLET | Freq: Two times a day (BID) | ORAL | Status: DC
Start: 2014-01-18 — End: 2014-10-09

## 2014-01-18 NOTE — Patient Instructions (Signed)
It was nice to see you!  Everything looks good.  We started metformin 1 pill twice a day with food to help with weight loss. The front desk will schedule you for nutrition clinic with me and Dr. Gerilyn Pilgrim for 11:30am on 01/26/14.  Try cold cabbage leaves on your breast to see if that helps.

## 2014-01-18 NOTE — Assessment & Plan Note (Signed)
Start metformin 500mg  BID. Scheduled in nutrition clinic next week.

## 2014-01-18 NOTE — Progress Notes (Signed)
   Subjective:    Patient ID: Makayla Vasquez, female    DOB: February 06, 1981, 33 y.o.   MRN: [id number]  HPI Makayla Vasquez is here for f/u.  Obesity She reports working on the dietary changes we discussed last fall, including decreasing starches and increasing veggies.  She has not seen any change in her weight.  She does report some chronic knee pain which she attributes to her weight.  Breast pain Reports left lateral breast discomfort for several months.  No skin changes, redness, fevers, lumps or bumps.  Thinks it may be a clogged duct.  Her son in 28 years old.  Current Outpatient Prescriptions on File Prior to Visit  Medication Sig Dispense Refill  . albuterol (PROVENTIL HFA;VENTOLIN HFA) 108 (90 BASE) MCG/ACT inhaler Inhale 2 puffs into the lungs every 4 (four) hours as needed for wheezing or shortness of breath.  1 each  0  . fluticasone (FLONASE) 50 MCG/ACT nasal spray Place 2 sprays into both nostrils daily.  16 g  6  . [DISCONTINUED] cetirizine (ZYRTEC) 10 MG tablet Take 1 tablet (10 mg total) by mouth daily.  30 tablet  11   No current facility-administered medications on file prior to visit.    I have reviewed and updated the following as appropriate: allergies and current medications SHx: former smoker  Review of Systems See HPI    Objective:   Physical Exam BP 121/60  Pulse 60  Temp(Src) 97.9 F (36.6 C) (Oral)  Ht 5\' 6"  (1.676 m)  Wt 241 lb (109.317 kg)  BMI 38.92 kg/m2 Gen: alert, cooperative, NAD HEENT: AT/[place], sclera white, MMM Neck: supple Left breast: no skin changes; no masses or tenderness      Assessment & Plan:

## 2014-01-18 NOTE — Assessment & Plan Note (Signed)
Left breast; no skin changes or lumps. Recommended trying cold cabbage leaves. Consider mammogram although she does not have risk factors.

## 2014-01-26 ENCOUNTER — Ambulatory Visit: Payer: Medicare Other | Admitting: Family Medicine

## 2014-01-31 IMAGING — US US PELVIS COMPLETE
1 series · 14 of 25 positions shown · non-contrast
Comparison: 07/09/2011

CLINICAL DATA: Pelvic pain.  No menses since Mirena removed in
[REDACTED]

TRANSABDOMINAL AND TRANSVAGINAL ULTRASOUND OF PELVIS
TECHNIQUE: Both transabdominal and transvaginal ultrasound
examinations of the pelvis were performed. Transabdominal technique
was performed for global imaging of the pelvis including uterus,
ovaries, adnexal regions, and pelvic cul-de-sac.

[Series 1: us pelvis complete · 14 of 46 slices shown]
[im 1/46]
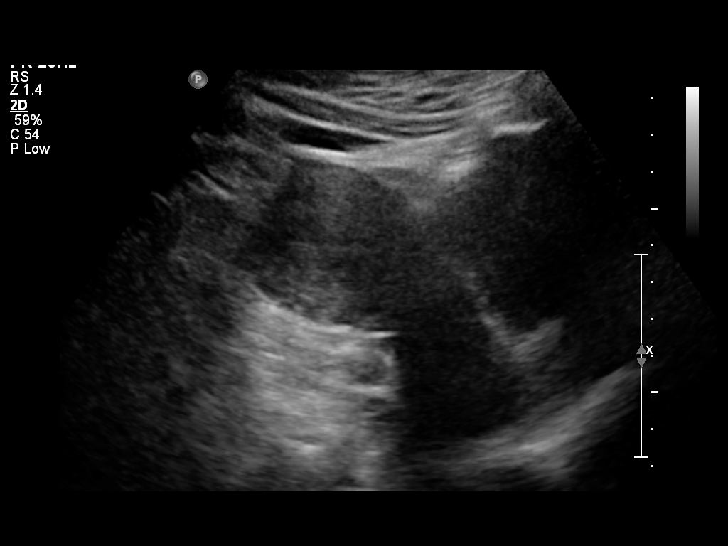
[im 4/46]
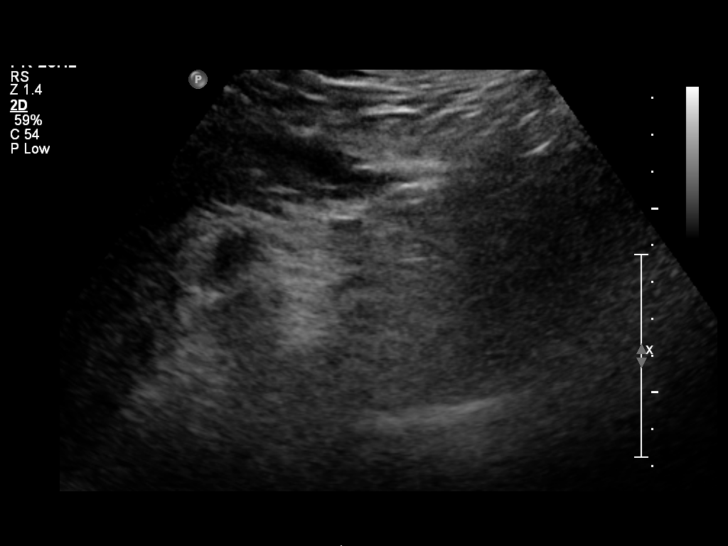
[im 8/46]
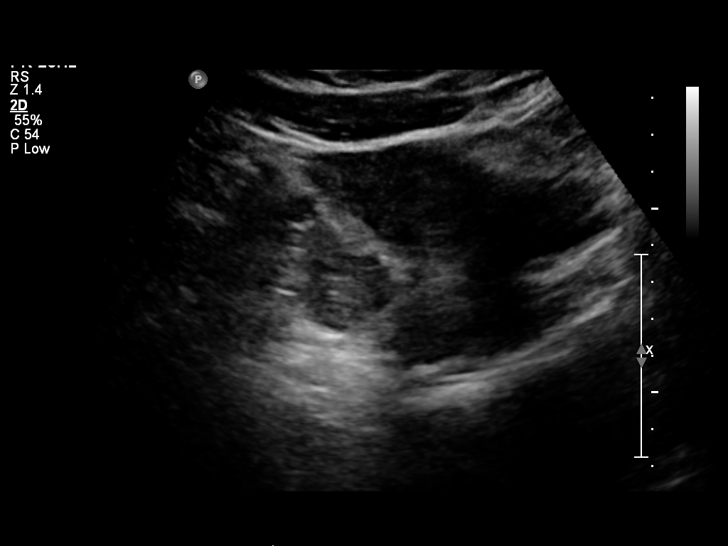
[im 12/46]
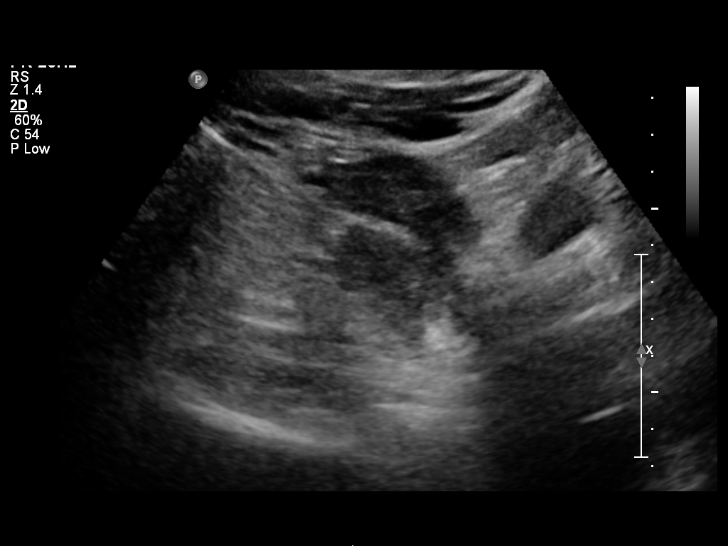
[im 16/46]
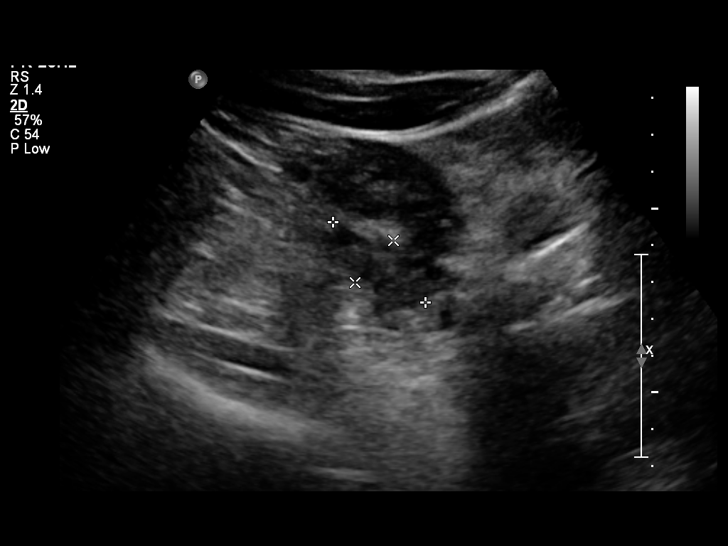
[im 17/46]
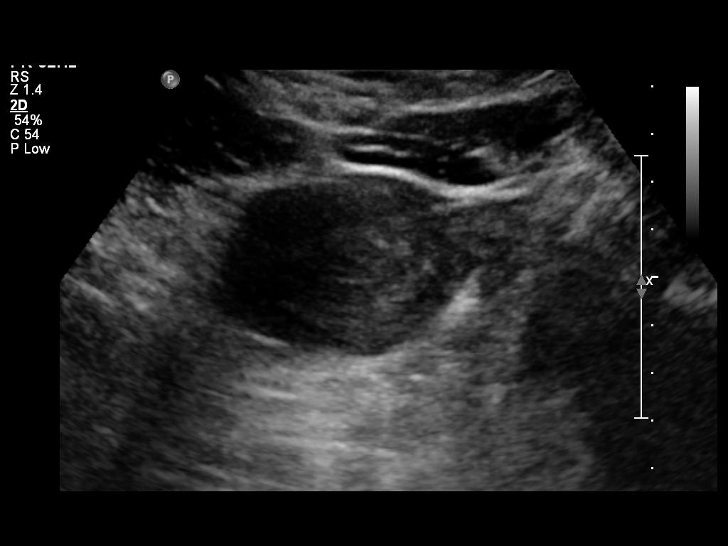
[im 21/46]
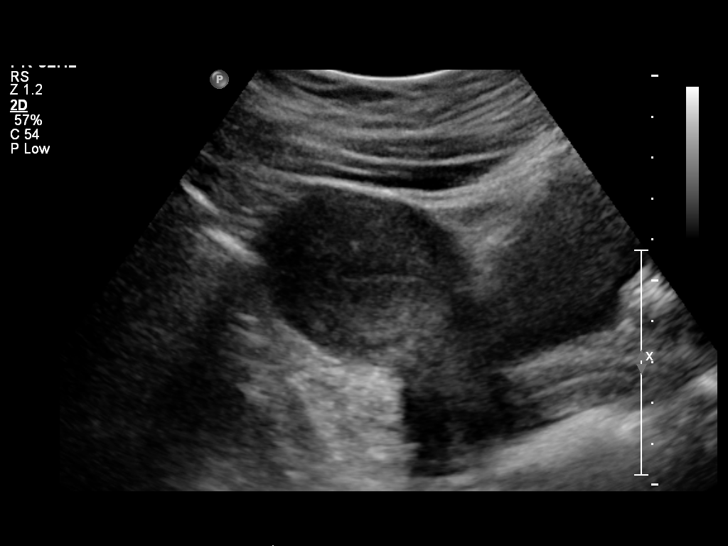
[im 25/46]
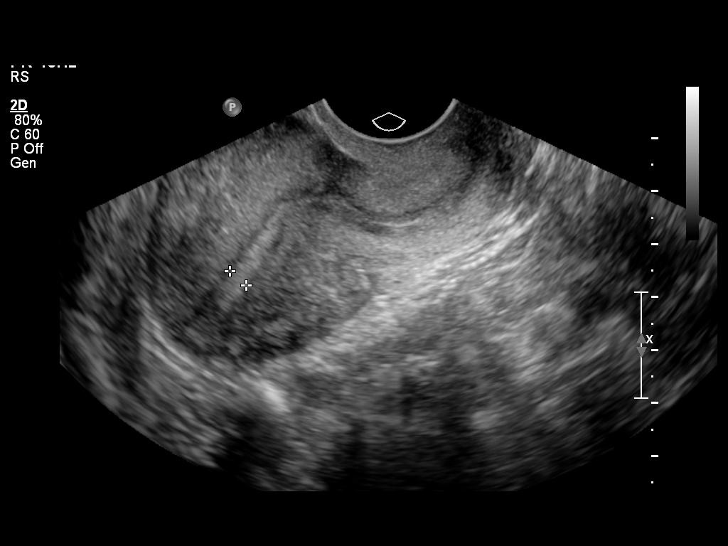
[im 29/46]
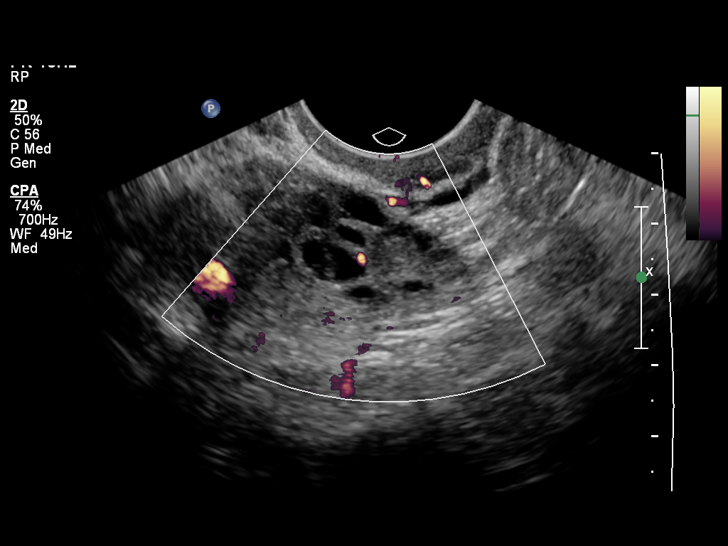
[im 31/46]
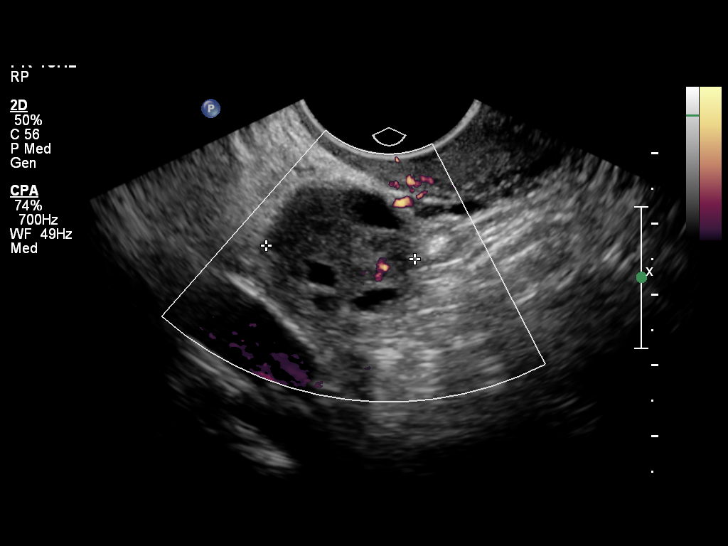
[im 34/46]
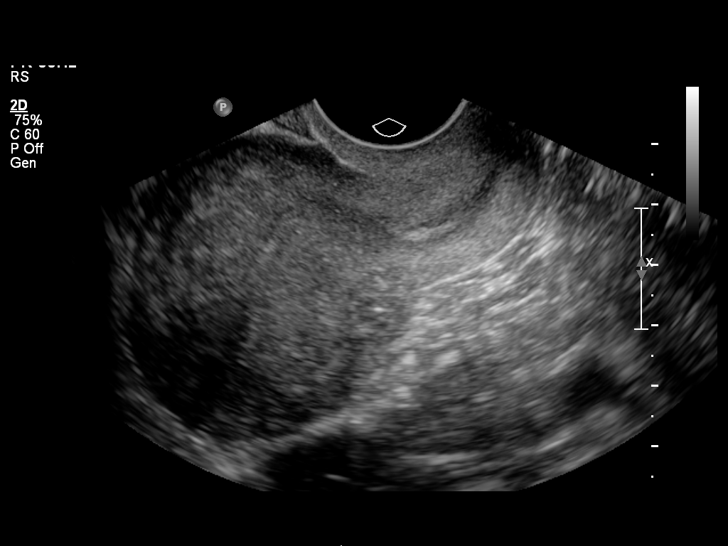
[im 38/46]
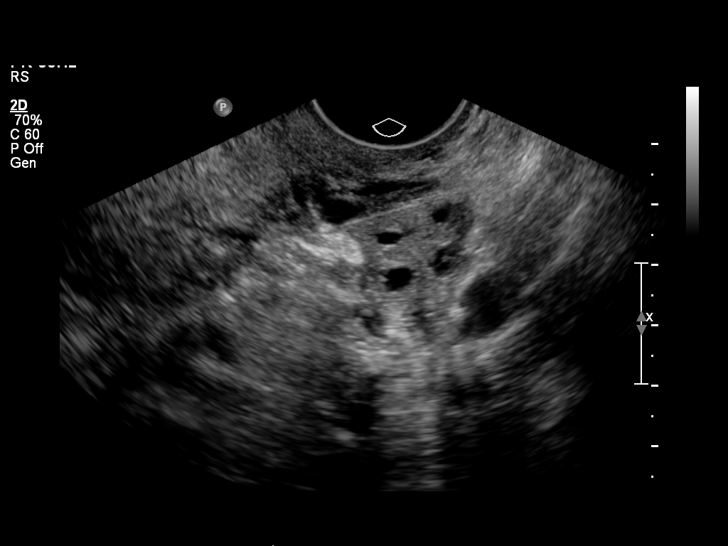
[im 42/46]
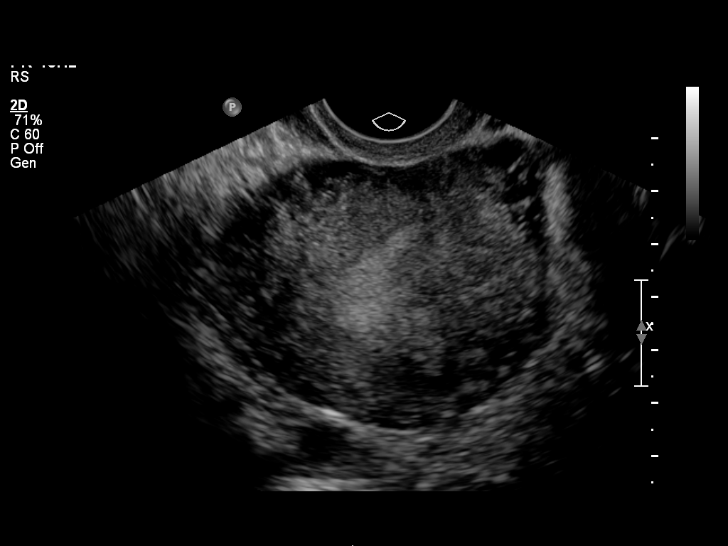
[im 46/46]
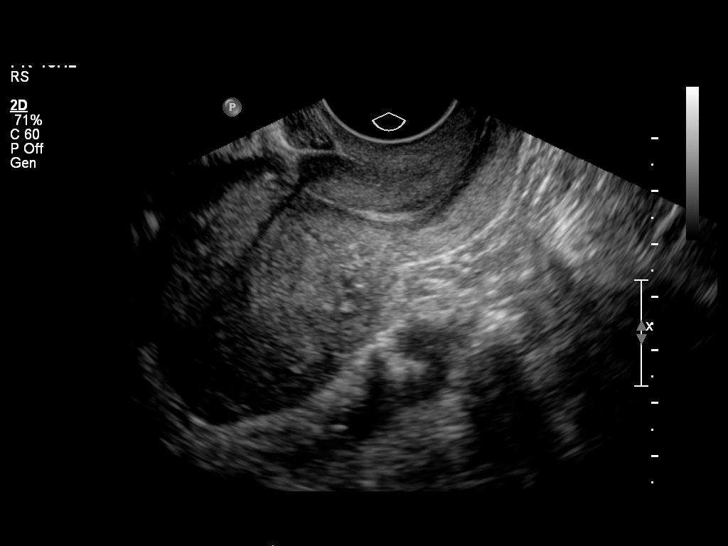

[14 of 25 positions shown; findings below may reference images not displayed]

It was necessary to proceed with endovaginal exam following the
transabdominal exam to visualize the myometrium and endometrium.
FINDINGS: Uterus: Demonstrates a sagittal length of 8.4 cm, depth of 4.3 cm
and width of 6.3 cm.  A homogeneous myometrium is seen

Endometrium: Appears thin and echogenic with an AP width of 4.1 mm.
No areas of focal thickening or heterogeneity are seen.

Right ovary:  Measures 3.5 x 1.7 x 2.1 cm and has a normal
appearance

Left ovary: Measures 3.2 x 1.8 x 2.1 cm and has a normal appearance

Other findings: No pelvic fluid or separate adnexal masses are
seen.
IMPRESSION: Normal pelvic ultrasound.

## 2014-02-22 ENCOUNTER — Telehealth: Payer: Self-pay | Admitting: *Deleted

## 2014-02-22 NOTE — Telephone Encounter (Signed)
Makayla Vasquez from [facility] called requesting NPI number.  Pt has appt 02/23/2014; NPI number given.  Clovis Pu, RN

## 2014-02-23 ENCOUNTER — Encounter: Payer: Self-pay | Admitting: Internal Medicine

## 2014-02-23 ENCOUNTER — Ambulatory Visit (INDEPENDENT_AMBULATORY_CARE_PROVIDER_SITE_OTHER): Payer: Medicare Other | Admitting: Internal Medicine

## 2014-02-23 ENCOUNTER — Other Ambulatory Visit: Payer: Medicare Other

## 2014-02-23 VITALS — BP 124/70 | HR 76 | Ht 66.0 in | Wt 243.0 lb

## 2014-02-23 DIAGNOSIS — J302 Other seasonal allergic rhinitis: Secondary | ICD-10-CM

## 2014-02-23 DIAGNOSIS — J309 Allergic rhinitis, unspecified: Secondary | ICD-10-CM | POA: Diagnosis not present

## 2014-02-23 DIAGNOSIS — J45909 Unspecified asthma, uncomplicated: Secondary | ICD-10-CM | POA: Diagnosis not present

## 2014-02-23 DIAGNOSIS — J4520 Mild intermittent asthma, uncomplicated: Secondary | ICD-10-CM

## 2014-02-23 DIAGNOSIS — Z950 Presence of cardiac pacemaker: Secondary | ICD-10-CM | POA: Diagnosis not present

## 2014-02-23 DIAGNOSIS — J3089 Other allergic rhinitis: Principal | ICD-10-CM

## 2014-02-23 MED ORDER — ALBUTEROL SULFATE HFA 108 (90 BASE) MCG/ACT IN AERS: 2.0000 | INHALATION_SPRAY | RESPIRATORY_TRACT | Status: DC | PRN

## 2014-02-23 NOTE — Patient Instructions (Signed)
Sample trial Symbicort maintenance inhaler 2 puffs then rinse mouth, twice daily  Sample Dymista nasal spray    1-2 puffs each nostril once every day at bedtime  Script sent to refill your albuterol rescue inhaler  Order lab- Allergy profile    Dx  allergic rhinitis  Suggest you take an otc acid blocker like Zantac, Tagamet or Pepcid to protect your esophagus from the reflux

## 2014-02-23 NOTE — Progress Notes (Signed)
02/23/14- 33 yoF former smoker Referred by Dr Piedad Climes for allergy consult.  C/o itchy, watery eyes, sinus congestion, headaches, prodcough with green mucous X several years. Mother and son are here She had been going to allergist at [facility] for allergic rhinitis, itching eyes and chest tightness for years as an adult. Typically worse in spring and summer, with occasional colds and with exposure to house dust. Daily wheeze.She tried allergy shots for 2 months, 2 years ago but had strong local reactions. Medical history significant for GERD, pacemaker for AV block Works as Lawyer. Mother has hx allergies. CXR 5/ 8 /14 IMPRESSION:  No active disease. No significant change.  Original Report Authenticated By: Natasha Mead, M.D.  Prior to Admission medications   Medication Sig Start Date End Date Taking? Authorizing Provider  carvedilol (COREG) 3.125 MG tablet Take 1 tablet (3.125 mg total) by mouth 2 (two) times daily with a meal. 01/18/14  Yes Charm Rings, MD  fluticasone (FLONASE) 50 MCG/ACT nasal spray Place 2 sprays into both nostrils daily. 12/01/13  Yes Charm Rings, MD  metFORMIN (GLUCOPHAGE) 500 MG tablet Take 1 tablet (500 mg total) by mouth 2 (two) times daily with a meal. 01/18/14  Yes Charm Rings, MD  albuterol (PROVENTIL HFA;VENTOLIN HFA) 108 (90 BASE) MCG/ACT inhaler Inhale 2 puffs into the lungs every 4 (four) hours as needed for wheezing or shortness of breath. 02/23/14   Waymon Budge, MD   Past Medical History  Diagnosis Date  . Heart palpitations   . Cardiac pacemaker     Since age 11. Last generator change in 2004  . Congenital complete AV block   . Obesity   . GERD (gastroesophageal reflux disease)   . Asymptomatic LV dysfunction     Echo in Dec 2011 with EF 35 to 40%. Felt to be due to paced rhythm  . Seizures     as a child- from high fever  . Anxiety   . Bipolar affective disorder   . Depression     bipolar  . Pacemaker     4-chamber  . Heart murmur     when I was little   . Carpal tunnel syndrome of right wrist   . Constipation   . Anemia     after child birth was put on Iron.  . Seasonal allergies   . CHF (congestive heart failure)     R/T 4-chamber pacemaker  . Automatic implantable cardioverter-defibrillator in situ     medtronic pacemaker   . Asthma     seasonal allergies   . Arthritis     rheumatoid arthritis- mild, no rheumatology care    Past Surgical History  Procedure Laterality Date  . Throat surgery  1994    s/p laser treatment  . Cesarean section    . Insert / replace / remove pacemaker      2001  . Cholecystectomy    . Iud removal  11/03/2011    Procedure: INTRAUTERINE DEVICE (IUD) REMOVAL;  Surgeon: Myra C. Marice Potter, MD;  Location: [facility];  Service: Gynecology;  Laterality: N/A;  . Cyst excision  12/10/2012    THYROID  . Thyroglossal duct cyst N/A 12/10/2012    Procedure: REVISION OF THYROGLOSSAL DUCT CYST EXCISION;  Surgeon: Serena Colonel, MD;  Location: [facility] OR;  Service: ENT;  Laterality: N/A;  Revision of Thyroglossal Duct Cyst Excision  . Nasal fracture surgery      /w plate   . Carpal tunnel with cubital tunnel  Right 07/26/2013    Procedure: RIGHT LIMITED OPEN CARPAL TUNNEL RELEASE ,  RIGHT CUBITAL TUNNEL RELEASE, INSITU VERSES ULNAR NERVE DECOMPRESSION AND ANTERIOR TRANSPOSITION;  Surgeon: Dominica Severin, MD;  Location: [facility] OR;  Service: Orthopedics;  Laterality: Right;   Family History  Problem Relation Age of Onset  . Heart disease Mother   . Hypertension Mother   . Heart disease Father   . Heart disease Sister     cardiomegaly and atherosclerosis   History   Social History  . Marital Status: Single    Spouse Name: N/A    Number of Children: 1  . Years of Education: N/A   Occupational History  . CNA    Social History Main Topics  . Smoking status: Former Smoker -- 0.25 packs/day for .5 years    Types: Cigarettes    Quit date: 07/25/1996  . Smokeless tobacco: Never Used  . Alcohol Use: No  . Drug Use: No  . Sexual  Activity: Yes    Birth Control/ Protection: Implant     Comment: placed 04/2011   Other Topics Concern  . Not on file   Social History Narrative  . No narrative on file   ROS-see HPI Constitutional:   No-   weight loss, night sweats, fevers, chills, fatigue, lassitude. HEENT:   +headaches, difficulty swallowing, tooth/dental problems, +sore throat,       +sneezing, +itching, +ear ache, +nasal congestion, post nasal drip,  CV:  No-   chest pain, orthopnea, PND, swelling in lower extremities, anasarca,                                  dizziness, palpitations Resp: No-   shortness of breath with exertion or at rest.              No-   productive cough,  No non-productive cough,  No- coughing up of blood.             + change in color of mucus.  No- wheezing.   Skin: No-   rash or lesions. GI:  + heartburn, +indigestion, abdominal pain, nausea, vomiting, diarrhea,                 change in bowel habits, loss of appetite GU: No-   dysuria, change in color of urine, no urgency or frequency.  No- flank pain. MS:  No-   joint pain or swelling.  No- decreased range of motion.  No- back pain. Neuro-     nothing unusual Psych:  No- change in mood or affect. No depression or anxiety.  No memory loss.  OBJ- Physical Exam General- Alert, Oriented, Affect-appropriate, Distress- none acute, + Skin- rash-none, lesions- none, excoriation- none Lymphadenopathy- none Head- atraumatic            Eyes- Gross vision intact, PERRLA, conjunctivae and secretions clear            Ears- Hearing, canals-normal            Nose- +, no-Septal dev, mucus, polyps, erosion, perforation             Throat- Mallampati II , mucosa clear , drainage- none, tonsils- atrophic Neck- flexible , trachea midline, no stridor , thyroid nl, carotid no bruit Chest - symmetrical excursion , unlabored           Heart/CV- RRR , no murmur , no gallop  , no rub,  nl s1 s2                           - JVD- none , edema- none, stasis  changes- none, varices- none           Lung- clear to P&A, wheeze- none, cough- none , dullness-none, rub- none           Chest wall-  Abd- tender-no, distended-no, bowel sounds-present, HSM- no Br/ Gen/ Rectal- Not done, not indicated Extrem- cyanosis- none, clubbing, none, atrophy- none, strength- nl Neuro- grossly intact to observation

## 2014-02-24 LAB — ALLERGY FULL PROFILE
Alternaria Alternata: <0.1 kU/L
Bahia Grass: <0.1 kU/L
Box Elder IgE: <0.1 kU/L
Cat Dander: <0.1 kU/L
Common Ragweed: <0.1 kU/L
Curvularia lunata: <0.1 kU/L
D. farinae: <0.1 kU/L
Elm IgE: <0.1 kU/L
Fescue: <0.1 kU/L
G009 Red Top: <0.1 kU/L
IGE (IMMUNOGLOBULIN E), SERUM: 22.9 [IU]/mL (ref 0.0–180.0)
Lamb's Quarters: <0.1 kU/L
Sycamore Tree: <0.1 kU/L
Timothy Grass: <0.1 kU/L

## 2014-03-01 ENCOUNTER — Telehealth: Payer: Self-pay | Admitting: Internal Medicine

## 2014-03-01 NOTE — Telephone Encounter (Signed)
Notes Recorded by Waymon Budge, MD on 02/24/2014 at 9:27 PM Allergy antibodies are all within normal limits, not showing obvious allergic process. We can discuss at office return visit.  Pt aware of results and to keep next OV as scheduled.

## 2014-03-14 ENCOUNTER — Ambulatory Visit (INDEPENDENT_AMBULATORY_CARE_PROVIDER_SITE_OTHER): Payer: Medicare Other | Admitting: Emergency Medicine

## 2014-03-14 ENCOUNTER — Encounter: Payer: Self-pay | Admitting: Emergency Medicine

## 2014-03-14 VITALS — BP 129/68 | HR 78 | Temp 98.1°F | Wt 240.0 lb

## 2014-03-14 DIAGNOSIS — IMO0001 Reserved for inherently not codable concepts without codable children: Secondary | ICD-10-CM

## 2014-03-14 DIAGNOSIS — Z309 Encounter for contraceptive management, unspecified: Secondary | ICD-10-CM | POA: Diagnosis not present

## 2014-03-14 NOTE — Assessment & Plan Note (Signed)
Nexplanon due to be replaced in one month. I will refer her to OB/GYN to discuss tubal ligation.

## 2014-03-14 NOTE — Progress Notes (Signed)
   Subjective:    Patient ID: Makayla Vasquez, female    DOB: 05-09-1981, 33 y.o.   MRN: [id number]  HPI Makayla Vasquez is here for birth control.  She is here to discuss birth control. She had Nexplanon placed 04/21/2011. She has been happy with the Nexplanon in that she does not have periods on it. However, she attributes some of her weight gain to the Nexplanon. She does not want to replace the Nexplanon. She has tried multiple other birth control methods in the past, including Depo and Mirena. All of these have caused side effects for her. She is interested in a tubal ligation.  Current Outpatient Prescriptions on File Prior to Visit  Medication Sig Dispense Refill  . albuterol (PROVENTIL HFA;VENTOLIN HFA) 108 (90 BASE) MCG/ACT inhaler Inhale 2 puffs into the lungs every 4 (four) hours as needed for wheezing or shortness of breath.  1 each  12  . carvedilol (COREG) 3.125 MG tablet Take 1 tablet (3.125 mg total) by mouth 2 (two) times daily with a meal.  60 tablet  11  . fluticasone (FLONASE) 50 MCG/ACT nasal spray Place 2 sprays into both nostrils daily.  16 g  6  . metFORMIN (GLUCOPHAGE) 500 MG tablet Take 1 tablet (500 mg total) by mouth 2 (two) times daily with a meal.  60 tablet  3  . [DISCONTINUED] cetirizine (ZYRTEC) 10 MG tablet Take 1 tablet (10 mg total) by mouth daily.  30 tablet  11   No current facility-administered medications on file prior to visit.    I have reviewed and updated the following as appropriate: allergies and current medications SHx: former smoker  Health Maintenance: up to date on pap    Review of Systems See HPI    Objective:   Physical Exam BP 129/68  Pulse 78  Temp(Src) 98.1 F (36.7 C) (Oral)  Wt 240 lb (108.863 kg) Gen: alert, cooperative, NAD     Assessment & Plan:  I spent 15 minutes with the patient, > 50% spent in counseling the patient.

## 2014-03-14 NOTE — Patient Instructions (Signed)
It was nice to see you!  I put in a referral to see the OB/GYN doctor to discuss getting a tubal ligation. If you have not heard anything in the next 2 weeks, please let me know.    Follow up in 1-2 months to discuss diabetes.

## 2014-03-18 IMAGING — CR DG ABDOMEN 1V
2 series · 2 of 2 positions shown · non-contrast
Comparison: None.

CLINICAL DATA: No lower abdominal pain, constipation

ABDOMEN - 1 VIEW

[view not recorded (1 of 2)]
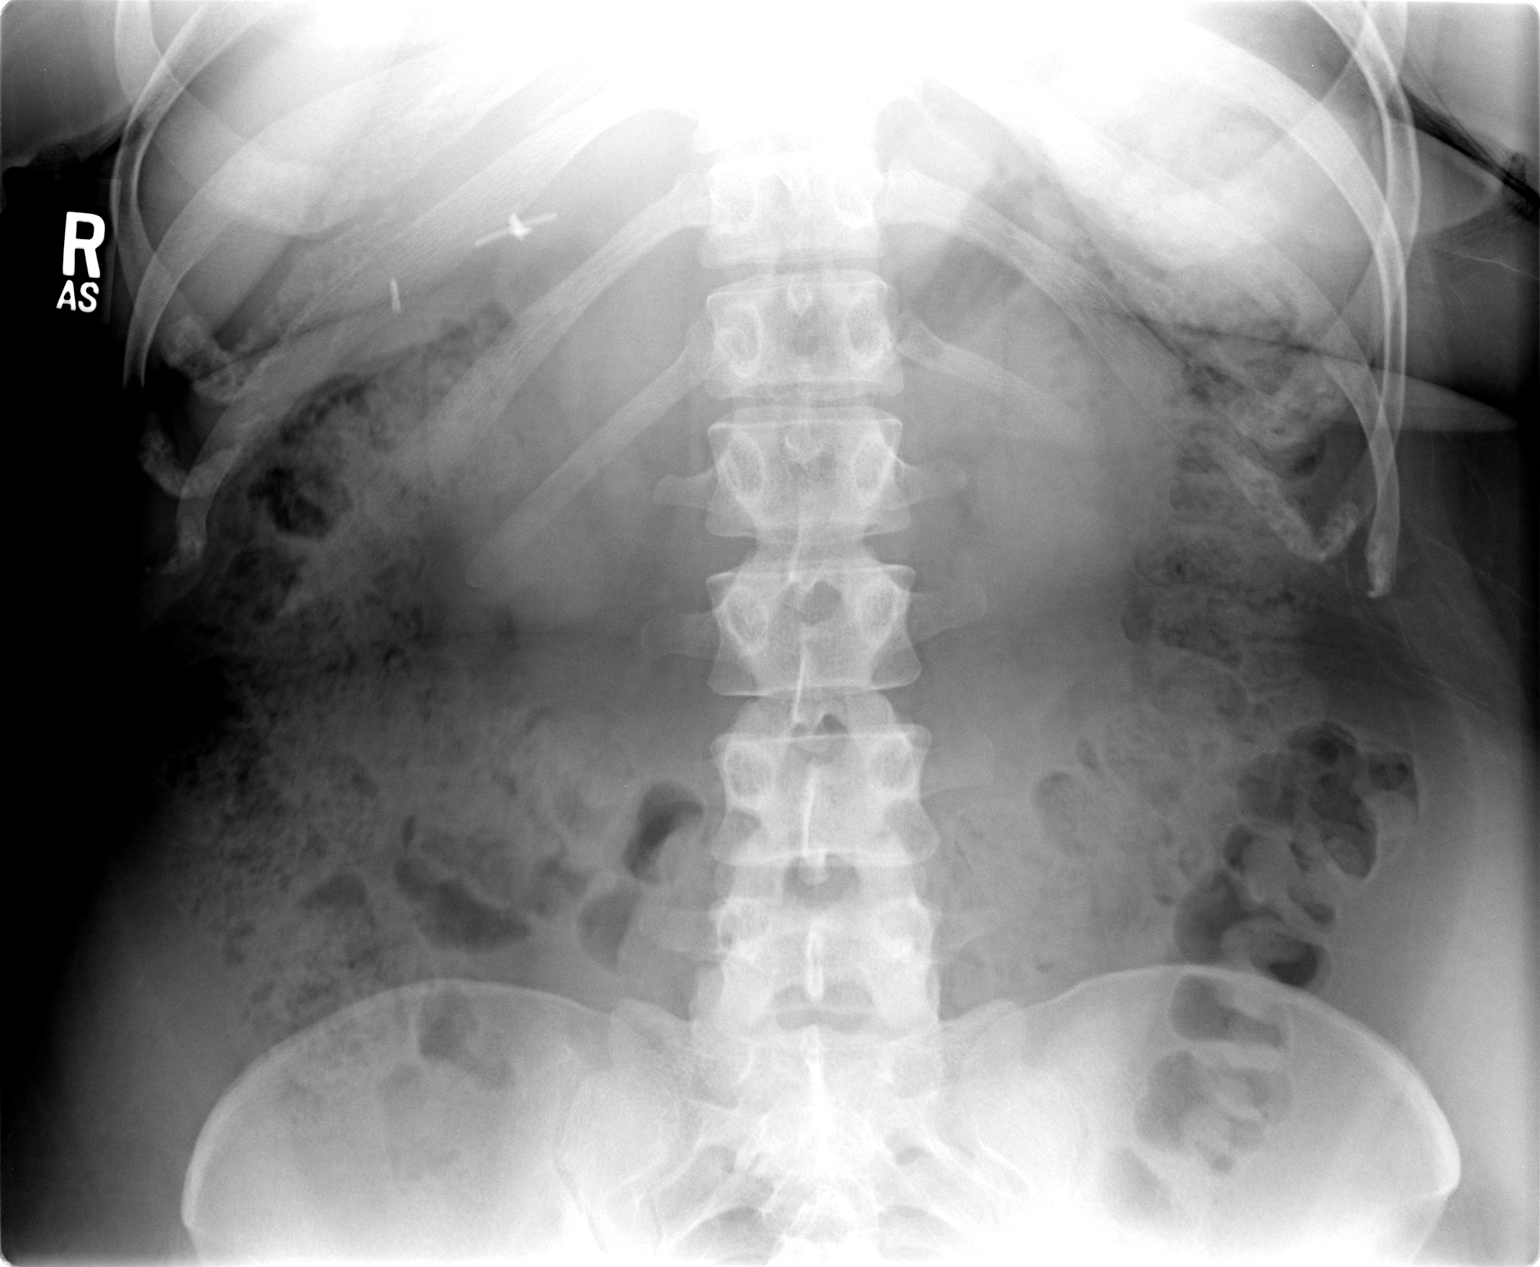

[view not recorded (2 of 2)]
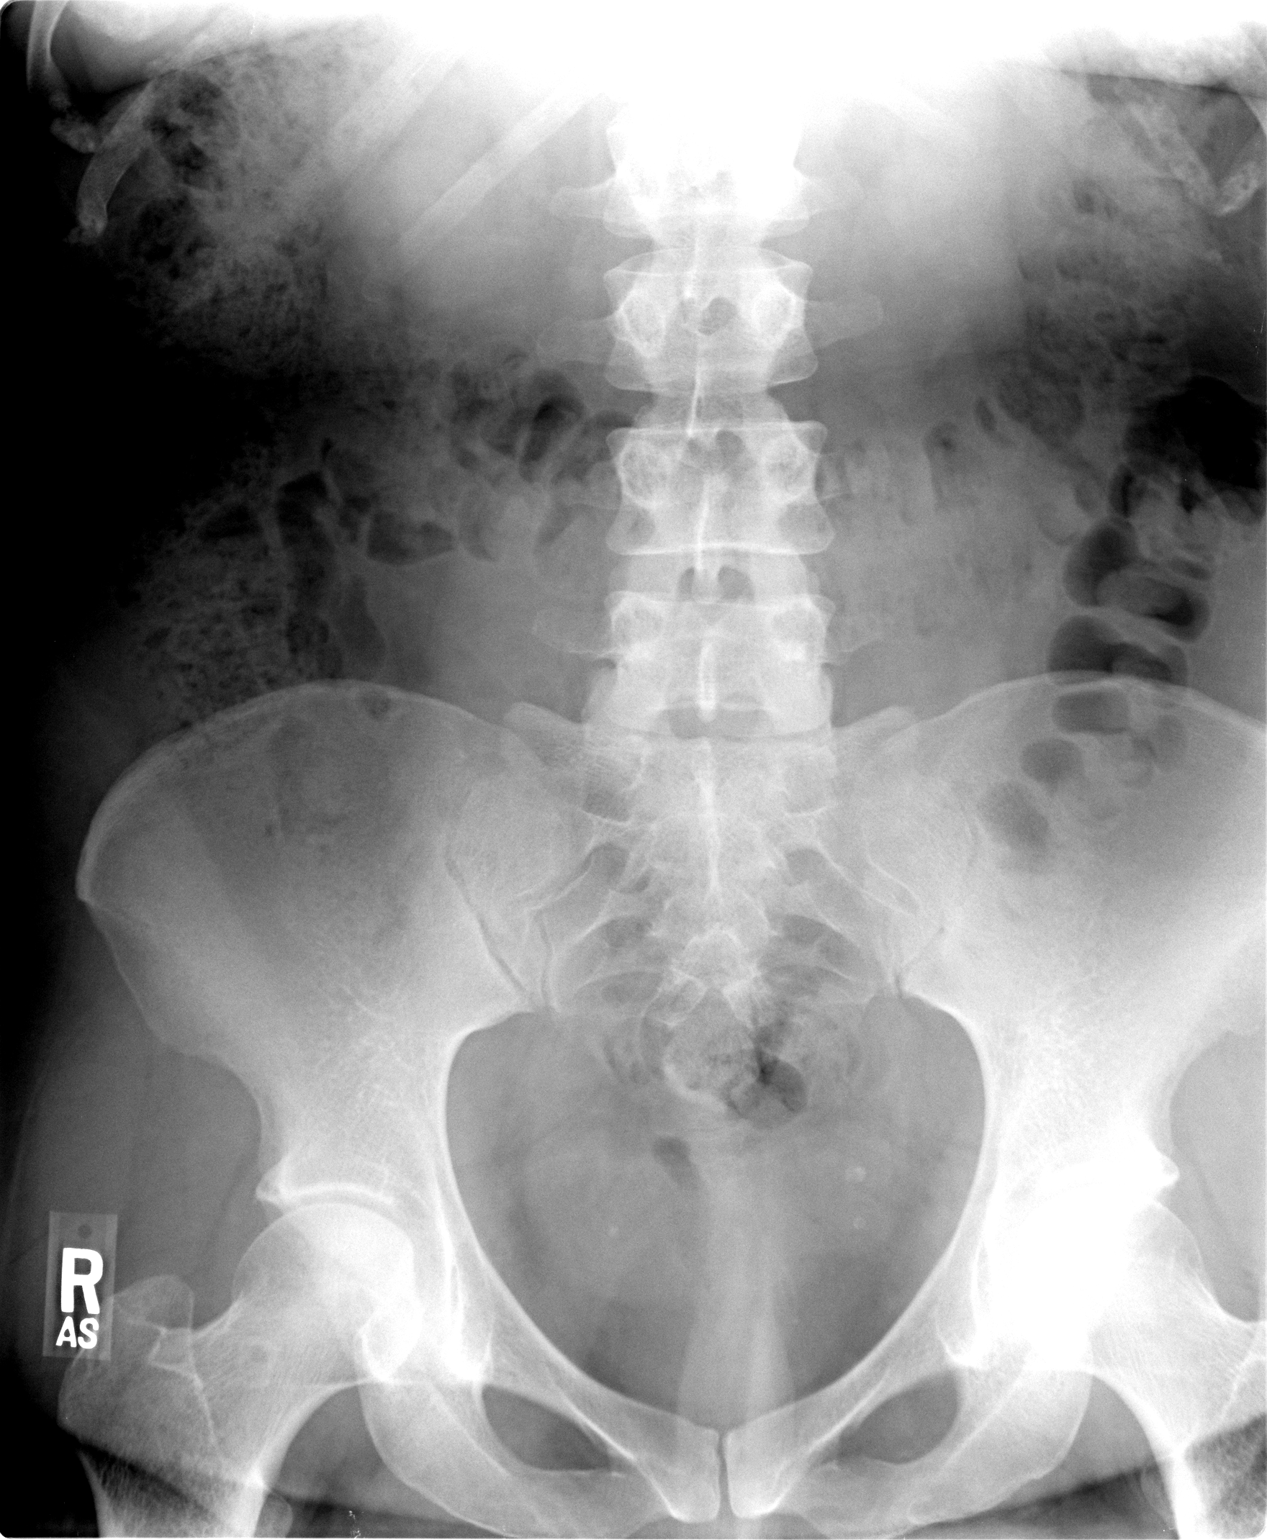

[2 of 2 positions shown; findings below may reference images not displayed]

FINDINGS: Supine views of the abdomen show a moderate amount of
feces throughout the entire colon.  No bowel obstruction is seen.
Surgical clips are present in the right upper quadrant.  No opaque
calculi are noted.
IMPRESSION: Moderate amount of feces throughout the colon primarily in the
transverse colon and right colon.

## 2014-03-27 ENCOUNTER — Ambulatory Visit (INDEPENDENT_AMBULATORY_CARE_PROVIDER_SITE_OTHER): Payer: Medicare Other | Admitting: *Deleted

## 2014-03-27 DIAGNOSIS — Z111 Encounter for screening for respiratory tuberculosis: Secondary | ICD-10-CM | POA: Diagnosis not present

## 2014-03-29 ENCOUNTER — Ambulatory Visit (INDEPENDENT_AMBULATORY_CARE_PROVIDER_SITE_OTHER): Payer: Medicare Other | Admitting: *Deleted

## 2014-03-29 ENCOUNTER — Encounter: Payer: Self-pay | Admitting: *Deleted

## 2014-03-29 DIAGNOSIS — Z111 Encounter for screening for respiratory tuberculosis: Secondary | ICD-10-CM

## 2014-03-29 LAB — TB SKIN TEST
Induration: 0 mm
TB SKIN TEST: NEGATIVE

## 2014-04-02 IMAGING — CR DG CHEST 2V
2 series · 2 of 2 positions shown · non-contrast
Comparison: Chest x-ray 07/31/2011.

CLINICAL DATA: Status post biventricular pacemaker insertion.

CHEST - 2 VIEW

[w chest pa]
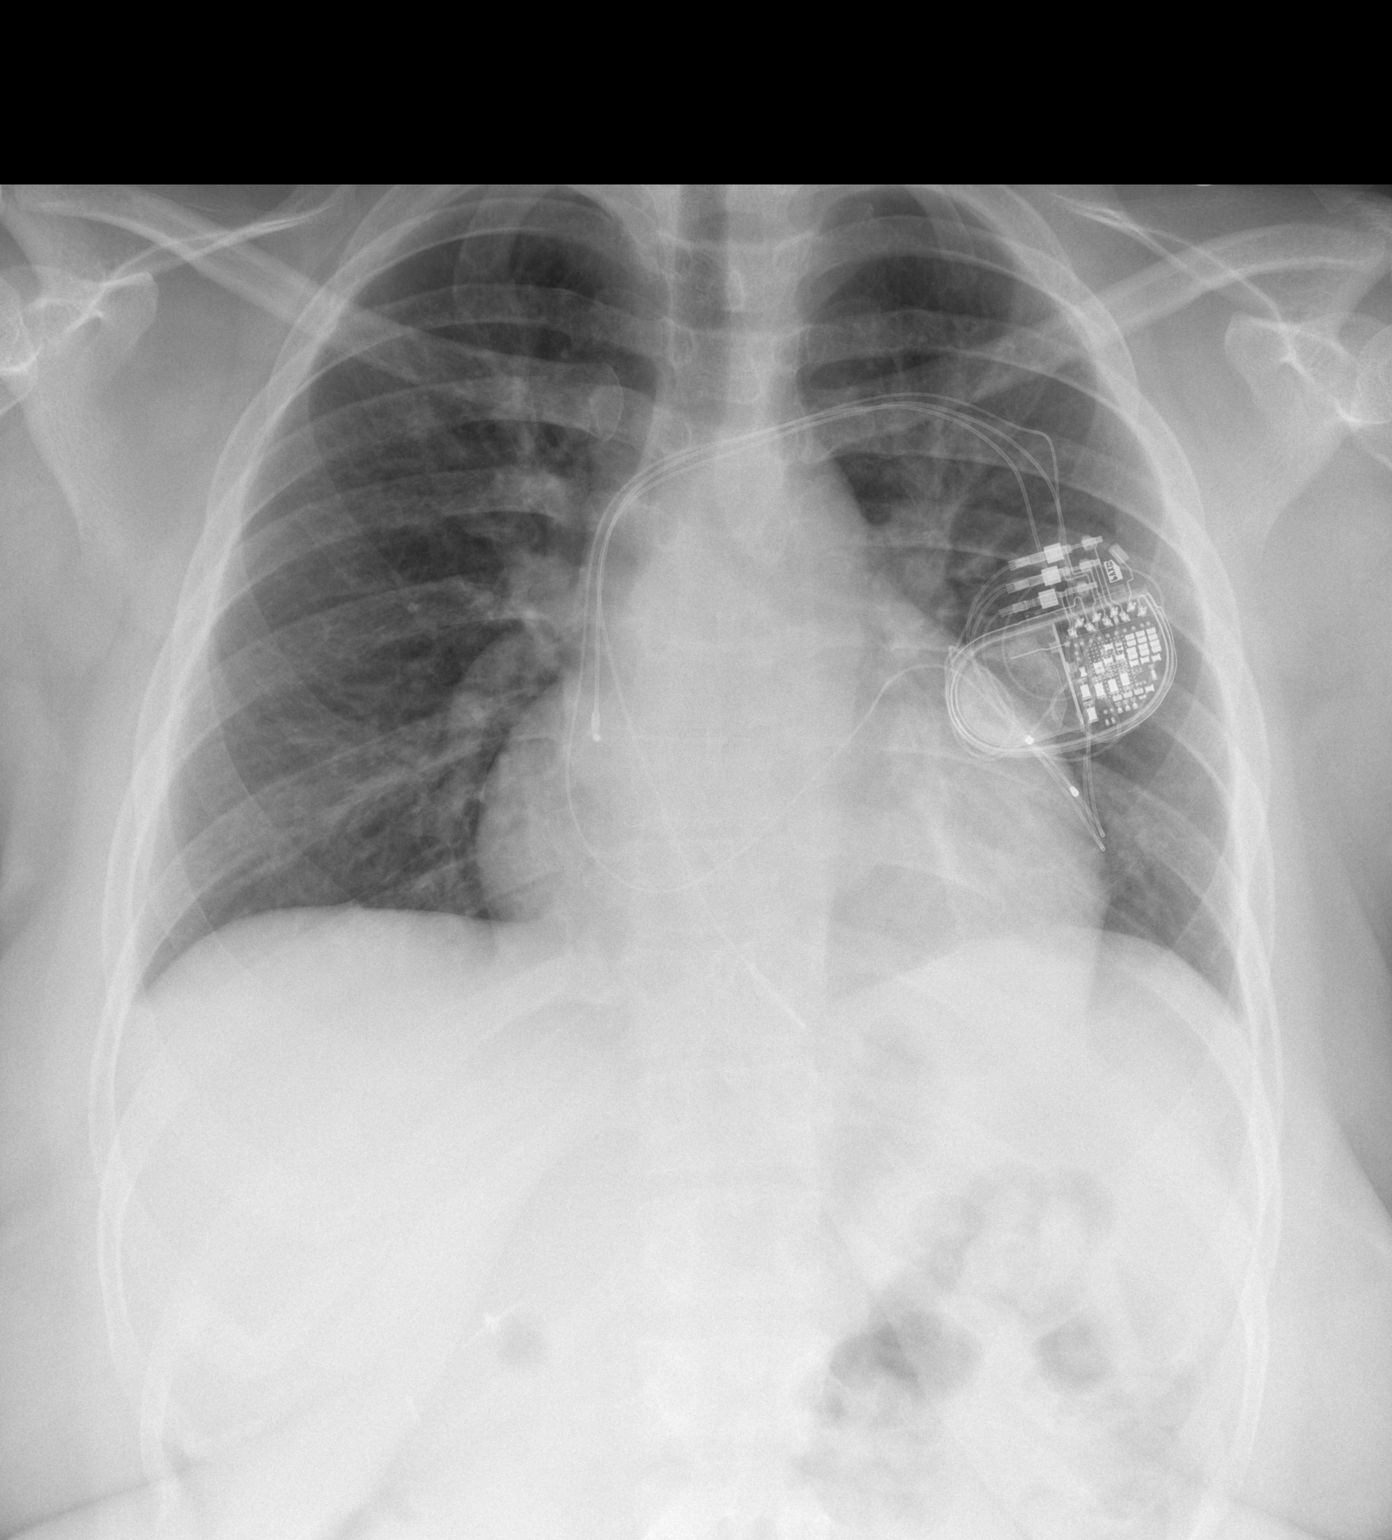

[w chest lat *]
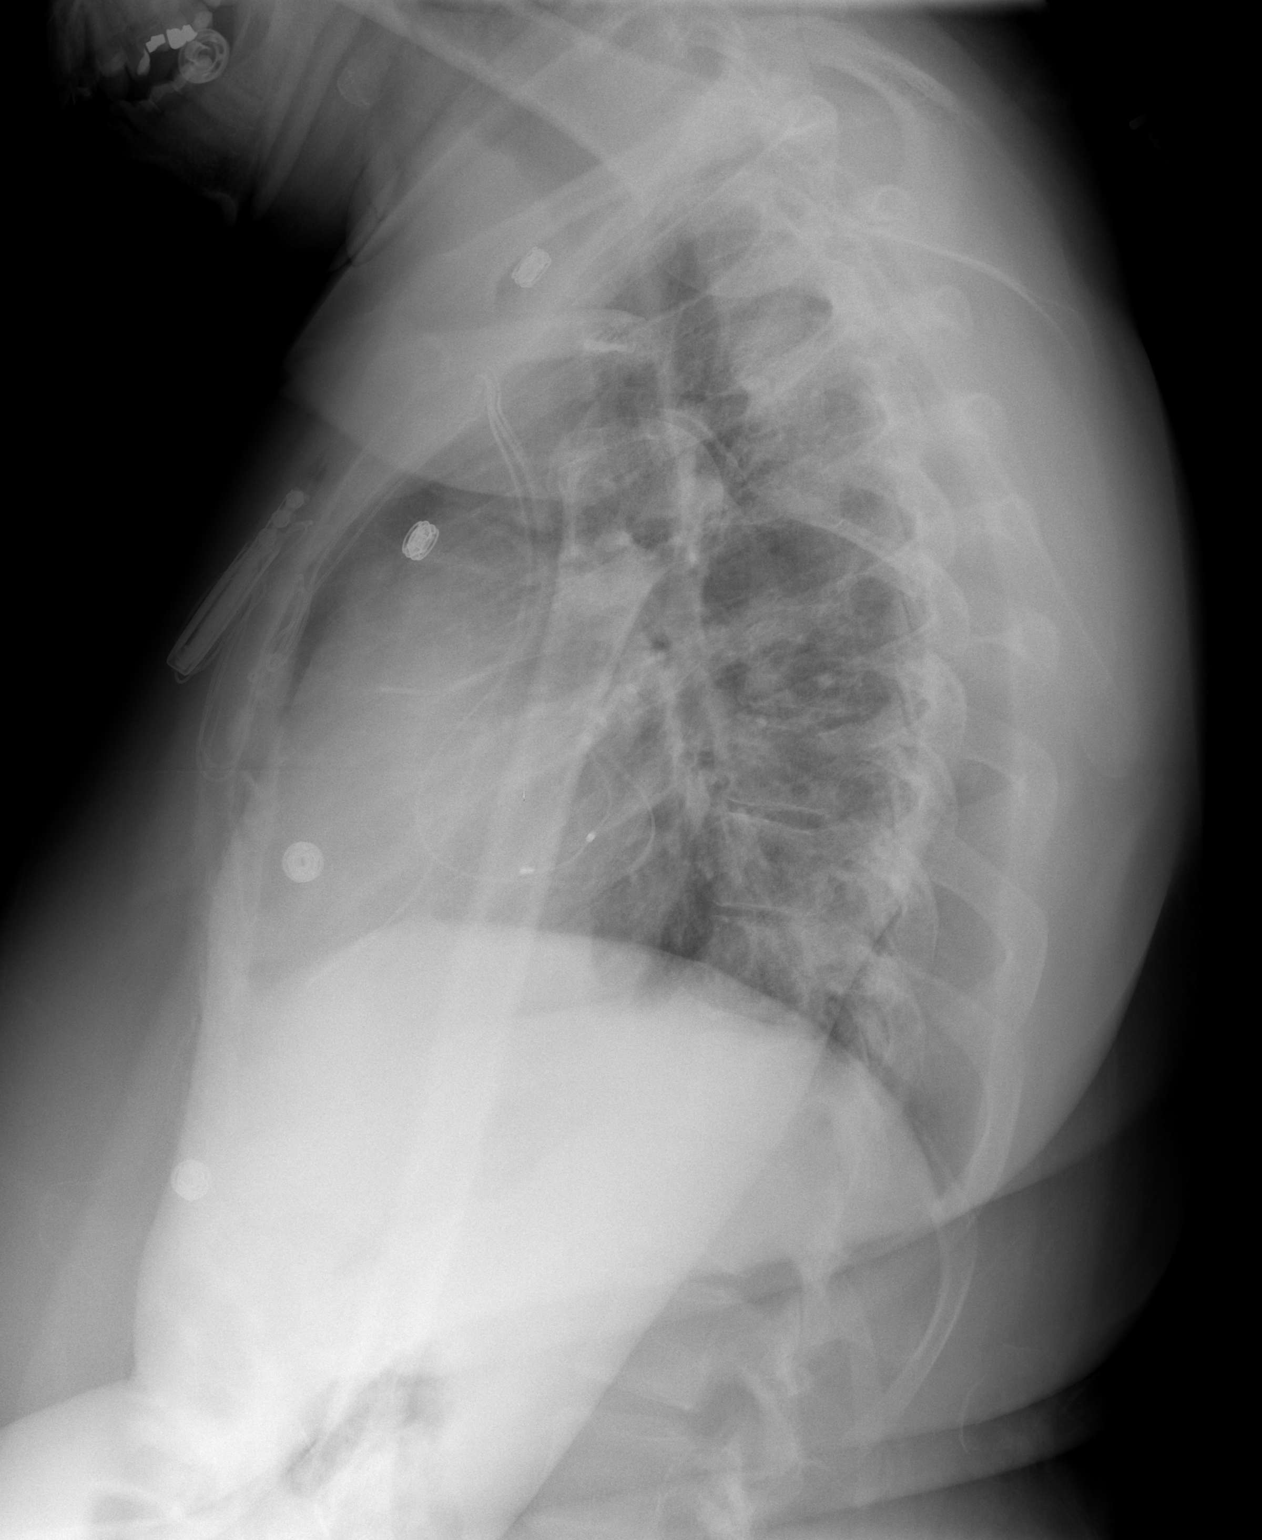

[2 of 2 positions shown; findings below may reference images not displayed]

FINDINGS: Compared to the prior examination there has been interval
exchange of the previously noted pacemaker device projecting over
the left hemithorax, and interval placement of a new lead which
extends to a region overlying the left lateral ventricle view of
the coronary veins and coronary sinus.  The other leads appears
similarly positioned with lead tips projecting in the expected
location of the right atrial appendage and right ventricular apex.
Lung volumes are low.  Linear opacity in the left lower lobe as
compatible with subsegmental atelectasis.  No pneumothorax.  No
consolidative airspace disease or pleural effusions.  Pulmonary
vasculature is within normal limits.  Mild cardiomegaly is noted.
Mediastinal contours are unremarkable.  Surgical clips project over
the right upper quadrant of the abdomen, likely from prior
cholecystectomy.
IMPRESSION: 1.  Interval exchange of pacemaker device and placement of a new
left ventricular lead, as above, without radiographic evidence of
acute complications or acute cardiopulmonary disease.
2.  Mild cardiomegaly.

## 2014-04-02 NOTE — Assessment & Plan Note (Signed)
Plan-lab allergy profile, sample Dymista with discussion

## 2014-04-02 NOTE — Assessment & Plan Note (Signed)
Plan-refill rescue inhaler, Symbicort 160

## 2014-04-02 NOTE — Assessment & Plan Note (Signed)
Managed by cardiology 

## 2014-04-06 ENCOUNTER — Encounter: Payer: Self-pay | Admitting: Internal Medicine

## 2014-04-06 ENCOUNTER — Ambulatory Visit (INDEPENDENT_AMBULATORY_CARE_PROVIDER_SITE_OTHER): Payer: Medicare Other | Admitting: Internal Medicine

## 2014-04-06 VITALS — BP 116/68 | HR 76 | Ht 66.0 in | Wt 241.2 lb

## 2014-04-06 DIAGNOSIS — J310 Chronic rhinitis: Secondary | ICD-10-CM | POA: Diagnosis not present

## 2014-04-06 DIAGNOSIS — J45909 Unspecified asthma, uncomplicated: Secondary | ICD-10-CM | POA: Diagnosis not present

## 2014-04-06 DIAGNOSIS — K219 Gastro-esophageal reflux disease without esophagitis: Secondary | ICD-10-CM

## 2014-04-06 DIAGNOSIS — J4520 Mild intermittent asthma, uncomplicated: Secondary | ICD-10-CM

## 2014-04-06 MED ORDER — OMEPRAZOLE 20 MG PO CPDR: 20.0000 mg | DELAYED_RELEASE_CAPSULE | Freq: Every day | ORAL | Status: DC

## 2014-04-06 MED ORDER — AZITHROMYCIN 250 MG PO TABS
ORAL_TABLET | ORAL | Status: DC
Start: 2014-04-06 — End: 2014-05-09

## 2014-04-06 MED ORDER — BUDESONIDE-FORMOTEROL FUMARATE 80-4.5 MCG/ACT IN AERO: INHALATION_SPRAY | RESPIRATORY_TRACT | Status: DC

## 2014-04-06 MED ORDER — AZELASTINE-FLUTICASONE 137-50 MCG/ACT NA SUSP: 1.0000 | Freq: Every day | NASAL | Status: DC

## 2014-04-06 NOTE — Patient Instructions (Signed)
Scripts sent for omeprazole acid blocker, Symbicort maintenance inhaler, Z pak antibiotic.   Script also sent for Dymista nasal spray. If insurance won't cover this, then use otc flonase nasal spray as directed on box.

## 2014-04-06 NOTE — Progress Notes (Signed)
02/23/14- 85 yoF former smoker Referred by Dr Piedad Climes for allergy consult.  C/o itchy, watery eyes, sinus congestion, headaches, prodcough with green mucous X several years. Mother and son are here She had been going to allergist at [facility] for allergic rhinitis, itching eyes and chest tightness for years as an adult. Typically worse in spring and summer, with occasional colds and with exposure to house dust. Daily wheeze.She tried allergy shots for 2 months, 2 years ago but had strong local reactions. Medical history significant for GERD, pacemaker for AV block Works as Lawyer. Mother has hx allergies. CXR 5/ 8 /14 IMPRESSION:  No active disease. No significant change.  Original Report Authenticated By: Natasha Mead, M.D.  04/06/14- 38 yoF former smoker Referred by Dr Piedad Climes for allergy consult.  C/o itchy, watery eyes, sinus congestion, headaches, prodcough with green mucous X several years. Mother and son are here FOLLOWS FOR: Allergies have been doing well until outdoors with trees,etc. Allergy profile 02/23/2014-negative with total IgE 22.9. She does give history of allergy shots in the past, stopped because of large local reactions. Past history suggests stronger allergic reactions when she was younger. We can retest her if there is a question She still reports some itching in her throat and cough sometimes green sputum  ROS-see HPI Constitutional:   No-   weight loss, night sweats, fevers, chills, fatigue, lassitude. HEENT:   +headaches, difficulty swallowing, tooth/dental problems, sore throat,       +sneezing, +itching, ear ache, +nasal congestion, post nasal drip,  CV:  No-   chest pain, orthopnea, PND, swelling in lower extremities, anasarca,                                  dizziness, palpitations Resp: No-   shortness of breath with exertion or at rest.              +productive cough,  No non-productive cough,  No- coughing up of blood.             + change in color of mucus.  No- wheezing.    Skin: No-   rash or lesions. GI:  + heartburn, +indigestion, abdominal pain, nausea, vomiting,  GU:  MS:  No-   joint pain or swelling.   Neuro-     nothing unusual Psych:  No- change in mood or affect. No depression or anxiety.  No memory loss.  OBJ- Physical Exam General- Alert, Oriented, Affect-appropriate, Distress- none acute,  Skin- rash-none, lesions- none, excoriation- none Lymphadenopathy- none Head- atraumatic            Eyes- Gross vision intact, PERRLA, conjunctivae and secretions clear            Ears- Hearing, canals-normal            Nose- +, no-Septal dev, mucus, polyps, erosion, perforation             Throat- Mallampati III , mucosa clear , drainage- none, tonsils- atrophic Neck- flexible , trachea midline, no stridor , thyroid nl, carotid no bruit Chest - symmetrical excursion , unlabored           Heart/CV- RRR , no murmur , no gallop  , no rub, nl s1 s2                           - JVD- none , edema- none, stasis  changes- none, varices- none           Lung- clear to P&A, wheeze- none, cough+ light , dullness-none, rub- none           Chest wall-  Abd-  Br/ Gen/ Rectal- Not done, not indicated Extrem- cyanosis- none, clubbing, none, atrophy- none, strength- nl Neuro- grossly intact to observation

## 2014-04-14 ENCOUNTER — Ambulatory Visit: Payer: Medicare Other | Admitting: Emergency Medicine

## 2014-04-20 ENCOUNTER — Encounter: Payer: Self-pay | Admitting: Obstetrics and Gynecology

## 2014-04-20 ENCOUNTER — Ambulatory Visit: Payer: Medicare Other | Admitting: Family Medicine

## 2014-04-20 ENCOUNTER — Ambulatory Visit (INDEPENDENT_AMBULATORY_CARE_PROVIDER_SITE_OTHER): Payer: Medicare Other | Admitting: Obstetrics and Gynecology

## 2014-04-20 VITALS — BP 111/74 | HR 74 | Temp 97.4°F | Ht 66.0 in | Wt 239.7 lb

## 2014-04-20 DIAGNOSIS — Z3009 Encounter for other general counseling and advice on contraception: Secondary | ICD-10-CM

## 2014-04-20 NOTE — Progress Notes (Signed)
Subjective:    Patient ID: Makayla Vasquez, female    DOB: 12-20-1980, 33 y.o.   MRN: [id number]  HPI  33 yo G1P1 here for sterilization consultation. Patient is without any other complaints.  Past Medical History  Diagnosis Date  . Heart palpitations   . Cardiac pacemaker     Since age 33. Last generator change in 2004  . Congenital complete AV block   . Obesity   . GERD (gastroesophageal reflux disease)   . Asymptomatic LV dysfunction     Echo in Dec 2011 with EF 35 to 40%. Felt to be due to paced rhythm  . Seizures     as a child- from high fever  . Anxiety   . Bipolar affective disorder   . Depression     bipolar  . Pacemaker     4-chamber  . Heart murmur     when I was little  . Carpal tunnel syndrome of right wrist   . Constipation   . Anemia     after child birth was put on Iron.  . Seasonal allergies   . CHF (congestive heart failure)     R/T 4-chamber pacemaker  . Automatic implantable cardioverter-defibrillator in situ     medtronic pacemaker   . Asthma     seasonal allergies   . Arthritis     rheumatoid arthritis- mild, no rheumatology care    Past Surgical History  Procedure Laterality Date  . Throat surgery  1994    s/p laser treatment  . Cesarean section    . Insert / replace / remove pacemaker      2001  . Cholecystectomy    . Iud removal  11/03/2011    Procedure: INTRAUTERINE DEVICE (IUD) REMOVAL;  Surgeon: Myra C. Marice Potter, MD;  Location: [facility];  Service: Gynecology;  Laterality: N/A;  . Cyst excision  12/10/2012    THYROID  . Thyroglossal duct cyst N/A 12/10/2012    Procedure: REVISION OF THYROGLOSSAL DUCT CYST EXCISION;  Surgeon: Serena Colonel, MD;  Location: [facility] OR;  Service: ENT;  Laterality: N/A;  Revision of Thyroglossal Duct Cyst Excision  . Nasal fracture surgery      /w plate   . Carpal tunnel with cubital tunnel Right 07/26/2013    Procedure: RIGHT LIMITED OPEN CARPAL TUNNEL RELEASE ,  RIGHT CUBITAL TUNNEL RELEASE, INSITU VERSES ULNAR NERVE  DECOMPRESSION AND ANTERIOR TRANSPOSITION;  Surgeon: Dominica Severin, MD;  Location: [facility] OR;  Service: Orthopedics;  Laterality: Right;   Family History  Problem Relation Age of Onset  . Heart disease Mother   . Hypertension Mother   . Heart disease Father   . Heart disease Sister     cardiomegaly and atherosclerosis   History   Social History  . Marital Status: Single    Spouse Name: N/A    Number of Children: 1  . Years of Education: N/A   Occupational History  . CNA    Social History Main Topics  . Smoking status: Former Smoker -- 0.25 packs/day for .5 years    Types: Cigarettes    Quit date: 07/25/1996  . Smokeless tobacco: Never Used  . Alcohol Use: No  . Drug Use: No  . Sexual Activity: Yes    Birth Control/ Protection: Implant     Comment: placed 04/2011   Other Topics Concern  . Not on file   Social History Narrative  . No narrative on file     Review of Systems  Objective:   Physical Exam  GENERAL: Well-developed, well-nourished female in no acute distress.  ABDOMEN: Soft, nontender, nondistended. No organomegaly. EXTREMITIES: No cyanosis, clubbing, or edema, 2+ distal pulses.       Assessment & Plan:   33 y.o. yo G1P1001  with undesired fertility who desires permanent sterilization. Risks and benefits of procedure discussed with patient including permanence of method, bleeding, infection, injury to surrounding organs and need for additional procedures. Risk failure of 0.5-1% with increased risk of ectopic gestation if pregnancy occurs was also discussed with patient. Patient will be scheduled for laparoscopic bilateral salpingectomy Medicaid consent forms to be signed today

## 2014-04-26 ENCOUNTER — Ambulatory Visit (INDEPENDENT_AMBULATORY_CARE_PROVIDER_SITE_OTHER): Payer: Medicare Other | Admitting: *Deleted

## 2014-04-26 ENCOUNTER — Encounter: Payer: Self-pay | Admitting: Internal Medicine

## 2014-04-26 DIAGNOSIS — Z950 Presence of cardiac pacemaker: Secondary | ICD-10-CM

## 2014-04-26 DIAGNOSIS — Q246 Congenital heart block: Secondary | ICD-10-CM | POA: Diagnosis not present

## 2014-04-26 DIAGNOSIS — I5022 Chronic systolic (congestive) heart failure: Secondary | ICD-10-CM

## 2014-04-26 LAB — MDC_IDC_ENUM_SESS_TYPE_INCLINIC
Brady Statistic AP VS Percent: 0.01 %
Brady Statistic AS VP Percent: 88.89 %
Brady Statistic RA Percent Paced: 10.73 %
Brady Statistic RV Percent Paced: 99.6 %
Date Time Interrogation Session: 20150708110545
Lead Channel Impedance Value: 361 Ohm
Lead Channel Impedance Value: 380 Ohm
Lead Channel Impedance Value: 494 Ohm
Lead Channel Impedance Value: 513 Ohm
Lead Channel Impedance Value: 779 Ohm
Lead Channel Pacing Threshold Amplitude: 0.625 V
Lead Channel Pacing Threshold Amplitude: 1.5 V
Lead Channel Pacing Threshold Pulse Width: 0.4 ms
Lead Channel Pacing Threshold Pulse Width: 0.4 ms
Lead Channel Sensing Intrinsic Amplitude: 4.375 mV
Lead Channel Sensing Intrinsic Amplitude: 4.875 mV
Lead Channel Sensing Intrinsic Amplitude: 5 mV
Lead Channel Setting Pacing Amplitude: 2 V
Lead Channel Setting Pacing Amplitude: 2 V
Lead Channel Setting Pacing Pulse Width: 0.4 ms
Lead Channel Setting Pacing Pulse Width: 0.4 ms
MDC IDC MSMT BATTERY REMAINING LONGEVITY: 53 mo
MDC IDC MSMT BATTERY VOLTAGE: 3 V
MDC IDC MSMT LEADCHNL LV IMPEDANCE VALUE: 475 Ohm
MDC IDC MSMT LEADCHNL LV IMPEDANCE VALUE: 475 Ohm
MDC IDC MSMT LEADCHNL LV PACING THRESHOLD AMPLITUDE: 0.875 V
MDC IDC MSMT LEADCHNL LV PACING THRESHOLD PULSEWIDTH: 0.4 ms
MDC IDC MSMT LEADCHNL RV IMPEDANCE VALUE: 247 Ohm
MDC IDC MSMT LEADCHNL RV IMPEDANCE VALUE: 266 Ohm
MDC IDC MSMT LEADCHNL RV SENSING INTR AMPL: 5.25 mV
MDC IDC SET LEADCHNL RV PACING AMPLITUDE: 3 V
MDC IDC SET LEADCHNL RV SENSING SENSITIVITY: 0.9 mV
MDC IDC STAT BRADY AP VP PERCENT: 10.71 %
MDC IDC STAT BRADY AS VS PERCENT: 0.39 %
Zone Setting Detection Interval: 330 ms
Zone Setting Detection Interval: 400 ms

## 2014-04-26 NOTE — Progress Notes (Signed)
CRT-P device check in clinic. Normal device function. Thresholds, sensing, impedance consistent with previous measurements. Histograms appropriate for patient and level of activity. 1 mode switch--6 sec. 1 NSVT---8 beats @171bpm . Patient bi-ventricularly pacing 99.6% of the time. Device programmed with appropriate safety margins---changed RV output from 2.5 to 3.0V. Device heart failure diagnostics are within normal limits---OptiVol up 04/01/14--04/03/14. Estimated longevity 41yrs. ROV w/ Dr. 11yr in 75mo.

## 2014-05-03 ENCOUNTER — Encounter: Payer: Self-pay | Admitting: *Deleted

## 2014-05-08 ENCOUNTER — Ambulatory Visit (INDEPENDENT_AMBULATORY_CARE_PROVIDER_SITE_OTHER): Payer: Medicare Other | Admitting: Family Medicine

## 2014-05-08 ENCOUNTER — Ambulatory Visit (HOSPITAL_COMMUNITY)
Admission: RE | Admit: 2014-05-08 | Discharge: 2014-05-08 | Disposition: A | Discharge status: Home / Self Care | Payer: Medicare Other | Source: Ambulatory Visit | Attending: Family Medicine | Admitting: Family Medicine

## 2014-05-08 ENCOUNTER — Encounter: Payer: Self-pay | Admitting: Family Medicine

## 2014-05-08 ENCOUNTER — Telehealth: Payer: Self-pay | Admitting: Family Medicine

## 2014-05-08 VITALS — BP 123/75 | HR 81 | Temp 98.6°F | Wt 234.0 lb

## 2014-05-08 DIAGNOSIS — R05 Cough: Secondary | ICD-10-CM | POA: Diagnosis present

## 2014-05-08 DIAGNOSIS — J180 Bronchopneumonia, unspecified organism: Secondary | ICD-10-CM | POA: Diagnosis not present

## 2014-05-08 DIAGNOSIS — R0902 Hypoxemia: Secondary | ICD-10-CM | POA: Diagnosis not present

## 2014-05-08 DIAGNOSIS — I5022 Chronic systolic (congestive) heart failure: Secondary | ICD-10-CM | POA: Diagnosis not present

## 2014-05-08 DIAGNOSIS — R0602 Shortness of breath: Secondary | ICD-10-CM | POA: Diagnosis not present

## 2014-05-08 DIAGNOSIS — R059 Cough, unspecified: Secondary | ICD-10-CM | POA: Insufficient documentation

## 2014-05-08 LAB — CBC WITH DIFFERENTIAL/PLATELET
Basophils Absolute: 0 10*3/uL (ref 0.0–0.1)
Basophils Relative: 1 % (ref 0–1)
EOS ABS: 0.1 10*3/uL (ref 0.0–0.7)
EOS PCT: 3 % (ref 0–5)
HEMATOCRIT: 42.5 % (ref 36.0–46.0)
Hemoglobin: 13.6 g/dL (ref 12.0–15.0)
Lymphocytes Relative: 36 % (ref 12–46)
Lymphs Abs: 1.3 10*3/uL (ref 0.7–4.0)
MCH: 28.2 pg (ref 26.0–34.0)
MCHC: 32 g/dL (ref 30.0–36.0)
MCV: 88 fL (ref 78.0–100.0)
MONO ABS: 0.3 10*3/uL (ref 0.1–1.0)
Monocytes Relative: 9 % (ref 3–12)
Neutro Abs: 1.9 10*3/uL (ref 1.7–7.7)
Neutrophils Relative %: 51 % (ref 43–77)
PLATELETS: 222 10*3/uL (ref 150–400)
RBC: 4.83 MIL/uL (ref 3.87–5.11)
RDW: 13.9 % (ref 11.5–15.5)
WBC: 3.7 10*3/uL — ABNORMAL LOW (ref 4.0–10.5)

## 2014-05-08 LAB — BASIC METABOLIC PANEL
BUN: 8 mg/dL (ref 6–23)
CHLORIDE: 104 meq/L (ref 96–112)
CO2: 26 meq/L (ref 19–32)
CREATININE: 0.87 mg/dL (ref 0.50–1.10)
Calcium: 9.1 mg/dL (ref 8.4–10.5)
Glucose, Bld: 86 mg/dL (ref 70–99)
Potassium: 4.1 mEq/L (ref 3.5–5.3)
Sodium: 143 mEq/L (ref 135–145)

## 2014-05-08 LAB — PRO B NATRIURETIC PEPTIDE: Pro B Natriuretic peptide (BNP): 14 pg/mL (ref <126)

## 2014-05-08 MED ORDER — BENZONATATE 100 MG PO CAPS: 100.0000 mg | ORAL_CAPSULE | Freq: Three times a day (TID) | ORAL | Status: DC | PRN

## 2014-05-08 MED ORDER — DOXYCYCLINE HYCLATE 100 MG PO TABS: 100.0000 mg | ORAL_TABLET | Freq: Two times a day (BID) | ORAL | Status: DC

## 2014-05-08 MED ORDER — LISINOPRIL 2.5 MG PO TABS: 2.5000 mg | ORAL_TABLET | Freq: Every day | ORAL | Status: DC

## 2014-05-08 MED ORDER — GUAIFENESIN ER 600 MG PO TB12: 600.0000 mg | ORAL_TABLET | Freq: Two times a day (BID) | ORAL | Status: DC | PRN

## 2014-05-08 MED ORDER — AMOXICILLIN-POT CLAVULANATE ER 1000-62.5 MG PO TB12: 2.0000 | ORAL_TABLET | Freq: Two times a day (BID) | ORAL | Status: DC

## 2014-05-08 NOTE — Assessment & Plan Note (Signed)
Patient with noted shortness of breath following what sounds like a viral URI. Patient with desaturation on ambulation to 86%, though no shortness of breath with this desat. She has a history of CHF last EF 30-35%, though her weight is decreased from her most recent weight, making CHF exacerbation unlikely. Her last hgb was normal, though anemia could be a contributing cause. This could be related to her asthma though there was no abnormalities on Pulmonary exam. Could be a PNA with the productive cough, though patient is afebrile and no abnormal breath sounds on exam. Will check stat labs and imaging, CXR, BNP, BMET, and cbc with diff, to evaluate for the above causes. Will start on mucinex and tessalon for her cough and congestion. Patient is to follow tomorrow to evaluate for improvement. Given return precautions.

## 2014-05-08 NOTE — Progress Notes (Signed)
Patient ID: Makayla Vasquez, female   DOB: 07-15-1981, 33 y.o.   MRN: [id number]  Marikay Alar, MD Phone: [phone]  Makayla Vasquez is a 33 y.o. female who presents today for same day appointment.  Cough: patient reports starting a week ago she had sinus congestion in frontal and maxillary sinuses. She then developed drainage followed by cough. The cough is productive of intermittent clear and green sputum. She also notes there are times when she is outside that she feels as though she can't breath. She denies fevers and sick contacts. She has not taken any medication for this issue. She has not tried her albuterol inhaler for this.    Patient is a former smoker.   ROS: Per HPI   Physical Exam Filed Vitals:   05/08/14 1039  BP: 123/75  Pulse: 81  Temp: 98.6 F (37 C)  O2 sat 94% Walking O2 sat 86%  Gen: Well NAD HEENT: PERRL,  MMM, OP with mild cobblestoning, TMs with clear fluid behind them, no cervical LAD Lungs: CTABL Nl WOB Heart: RRR no MRG Exts: Non edematous BL  LE, warm and well perfused.    Assessment/Plan: Please see individual problem list.  # Healthcare maintenance: up to date

## 2014-05-08 NOTE — Assessment & Plan Note (Signed)
Patients weight is down. In reviewing the patients chart for her shortness of breath complain, it was noted that she was not on an ACE inhibitor. Thus she was started on lisinopril 2.5 mg today. Will need repeat Bmet in one week of starting this medication.

## 2014-05-08 NOTE — Patient Instructions (Addendum)
Nice to see you. Please go get the chest x-ray today. We will call with the results of the tests. Please use your albuterol inhaler for the shortness of breath every 6 hours as needed. Please try the mucinex and tessalon for the cough.  Shortness of Breath Shortness of breath means you have trouble breathing. Shortness of breath needs medical care right away. HOME CARE   Do not smoke.  Avoid being around chemicals or things (paint fumes, dust) that may bother your breathing.  Rest as needed. Slowly begin your normal activities.  Only take medicines as told by your doctor.  Keep all doctor visits as told. GET HELP RIGHT AWAY IF:   Your shortness of breath gets worse.  You feel lightheaded, pass out (faint), or have a cough that is not helped by medicine.  You cough up blood.  You have pain with breathing.  You have pain in your chest, arms, shoulders, or belly (abdomen).  You have a fever.  You cannot walk up stairs or exercise the way you normally do.  You do not get better in the time expected.  You have a hard time doing normal activities even with rest.  You have problems with your medicines.  You have any new symptoms. MAKE SURE YOU:  Understand these instructions.  Will watch your condition.  Will get help right away if you are not doing well or get worse. Document Released: 03/24/2008 Document Revised: 10/11/2013 Document Reviewed: 12/22/2011 [facility] Patient Information 2015 [place], [place]. This information is not intended to replace advice given to you by your health care provider. Make sure you discuss any questions you have with your health care provider.

## 2014-05-08 NOTE — Telephone Encounter (Signed)
Patients CXR returned with bronchopneumonia. Patient has history of prolonged QT and significant cardiac history making levoquin a poor option. Will treat with augmentin 2 g BID and doxycycline 100 mg BID for 5 days given her co-morbidities. She will follow-up in clinic tomorrow. Given return precautions.

## 2014-05-09 ENCOUNTER — Encounter: Payer: Self-pay | Admitting: Family Medicine

## 2014-05-09 ENCOUNTER — Ambulatory Visit (INDEPENDENT_AMBULATORY_CARE_PROVIDER_SITE_OTHER): Payer: Medicare Other | Admitting: Family Medicine

## 2014-05-09 VITALS — BP 114/80 | HR 80 | Ht 66.0 in | Wt 234.0 lb

## 2014-05-09 DIAGNOSIS — I5022 Chronic systolic (congestive) heart failure: Secondary | ICD-10-CM

## 2014-05-09 DIAGNOSIS — R7303 Prediabetes: Secondary | ICD-10-CM

## 2014-05-09 DIAGNOSIS — J189 Pneumonia, unspecified organism: Secondary | ICD-10-CM | POA: Diagnosis not present

## 2014-05-09 DIAGNOSIS — R7302 Impaired glucose tolerance (oral): Secondary | ICD-10-CM

## 2014-05-09 DIAGNOSIS — I10 Essential (primary) hypertension: Secondary | ICD-10-CM | POA: Diagnosis not present

## 2014-05-09 DIAGNOSIS — R7309 Other abnormal glucose: Secondary | ICD-10-CM

## 2014-05-09 LAB — POCT GLYCOSYLATED HEMOGLOBIN (HGB A1C): Hemoglobin A1C: 6.3

## 2014-05-09 NOTE — Assessment & Plan Note (Signed)
Stable, euvolemic and asymptomatic. She is complaint with cardiology follow up. Pro-BNP checked yesterday was normal. Lisinopril added to her regimen, patient instructed to check BP routinely due to new medication. She had not taken Lisinopril today and her BP is low normal. She is instructed to hold Lisinopril if BP is lower while taking it. She agreed with plan.

## 2014-05-09 NOTE — Assessment & Plan Note (Signed)
Chest xray reviewed and discussed with patient. B/L increase lung opacity mostly in the middle lobe. Read as bronchogenic PNA. Patient afebrile, labs looks ok except for slightly low WBC. Cough and SOB improved, mostly resolved. O2 sat remains low (90% at rest today), uncertain if this is her baseline as she is not in respiratory distress. 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 Patient doing well on current A/B regimen, I recommended continuing this for now. Low risk for PE but a possibility with low O2 sat. CT scan done in 2014 reviewed which showed LL nodule, repeat recommended. Patient has hx of smoking hence I discussed revisiting this with her PCP to schedule CT. She is instructed to return to clinic tomorrow as nurse visit for O2 check at rest and on exertion. If O2 remains low, I will check D-Dimer and may then order CTA, although PE risk for her is low per wells criteria. Return precaution was given. Patient verbalized understanding and agreed with plan.

## 2014-05-09 NOTE — Progress Notes (Signed)
Subjective:     Patient ID: Makayla Vasquez, female   DOB: 06/29/1981, 33 y.o.   MRN: [id number]  HPI Cough/SOB: She is here for follow up with cough and SOB, she was sent home on Doxycycline and Augmentin, she is yet to start her Augmentin, she stated she feels better already on her Doxy, denies any cough or SOB since yesterday, she does still have nasal drainage to the back of her throat.She denies any fever, no chest pain. 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 She feels some of them might have Pneumonia. CHF/HTN: Denies chest pain or palpitations, she is compliant with her Coreg, was recently started on Lisinopril but she stated she is yet to start taking it. She is compliant with her cardiology follow up, she goes for Pacemaker check 6 monthly and then yearly as needed. Denies any leg swelling. GG:YIRSWNI mentioned she is prediabetic, was started on Metformin for this and had not follow up since about 1 yr ago on her A1C, she denies any concern.  Current Outpatient Prescriptions on File Prior to Visit  Medication Sig Dispense Refill  . albuterol (PROVENTIL HFA;VENTOLIN HFA) 108 (90 BASE) MCG/ACT inhaler Inhale 2 puffs into the lungs every 4 (four) hours as needed for wheezing or shortness of breath.  1 each  12  . Azelastine-Fluticasone (DYMISTA) 137-50 MCG/ACT SUSP Place 1-2 puffs into the nose at bedtime.  1 Bottle  prn  . budesonide-formoterol (SYMBICORT) 80-4.5 MCG/ACT inhaler 2 puffs then rinse mouth, twice daily  1 Inhaler  12  . carvedilol (COREG) 3.125 MG tablet Take 1 tablet (3.125 mg total) by mouth 2 (two) times daily with a meal.  60 tablet  11  . doxycycline (VIBRA-TABS) 100 MG tablet Take 1 tablet (100 mg total) by mouth 2 (two) times daily.  10 tablet  0  . metFORMIN (GLUCOPHAGE) 500 MG tablet Take 1 tablet (500 mg total) by mouth 2 (two) times daily with a meal.  60 tablet  3  . amoxicillin-clavulanate (AUGMENTIN XR) 1000-62.5 MG per  tablet Take 2 tablets by mouth 2 (two) times daily.  20 tablet  0  . benzonatate (TESSALON) 100 MG capsule Take 1 capsule (100 mg total) by mouth 3 (three) times daily as needed for cough.  20 capsule  0  . fluticasone (FLONASE) 50 MCG/ACT nasal spray Place 2 sprays into both nostrils daily.  16 g  6  . guaiFENesin (MUCINEX) 600 MG 12 hr tablet Take 1 tablet (600 mg total) by mouth 2 (two) times daily as needed for cough or to loosen phlegm.  30 tablet  0  . lisinopril (PRINIVIL,ZESTRIL) 2.5 MG tablet Take 1 tablet (2.5 mg total) by mouth daily.  30 tablet  3  . omeprazole (PRILOSEC) 20 MG capsule Take 1 capsule (20 mg total) by mouth daily.  30 capsule  11  . [DISCONTINUED] cetirizine (ZYRTEC) 10 MG tablet Take 1 tablet (10 mg total) by mouth daily.  30 tablet  11   No current facility-administered medications on file prior to visit.    Past Medical History  Diagnosis Date  . Heart palpitations   . Cardiac pacemaker     Since age 83. Last generator change in 2004  . Congenital complete AV block   . Obesity   . GERD (gastroesophageal reflux disease)   . Asymptomatic LV dysfunction     Echo in Dec 2011 with EF 35 to 40%. Felt to be  due to paced rhythm  . Seizures     as a child- from high fever  . Anxiety   . Bipolar affective disorder   . Depression     bipolar  . Pacemaker     4-chamber  . Heart murmur     when I was little  . Carpal tunnel syndrome of right wrist   . Constipation   . Anemia     after child birth was put on Iron.  . Seasonal allergies   . CHF (congestive heart failure)     R/T 4-chamber pacemaker  . Automatic implantable cardioverter-defibrillator in situ     medtronic pacemaker   . Asthma     seasonal allergies   . Arthritis     rheumatoid arthritis- mild, no rheumatology care       Review of Systems  Respiratory: Negative.  Negative for cough, chest tightness, shortness of breath and wheezing.   Cardiovascular: Negative.   Gastrointestinal:  Negative.   Genitourinary: Negative.   All other systems reviewed and are negative.  Filed Vitals:   05/09/14 0943  BP: 114/80  Pulse: 80  Height: 5\' 6"  (1.676 m)  Weight: 234 lb (106.142 kg)  SpO2: 90%       Objective:   Physical Exam  Nursing note and vitals reviewed. Constitutional: She is oriented to person, place, and time. She appears well-developed. No distress.  Cardiovascular: Normal rate, regular rhythm, normal heart sounds and intact distal pulses.   No murmur heard. Pulmonary/Chest: Effort normal and breath sounds normal. No apnea and not tachypneic. No respiratory distress. She has no decreased breath sounds. She has no wheezes.    Abdominal: Soft. Bowel sounds are normal. She exhibits no distension and no mass. There is no tenderness.  Musculoskeletal: Normal range of motion. She exhibits no edema.  Neurological: She is alert and oriented to person, place, and time.       Assessment:     Bronchogenic PNA: SOB,Cough CHF HTN Pre-diabetic     Plan:     Check problem list.

## 2014-05-09 NOTE — Assessment & Plan Note (Signed)
Lisinopril added to her regimen, patient instructed to check BP routinely due to new medication. She had not taken Lisinopril today and her BP is low normal. She is instructed to hold Lisinopril if BP is lower while taking it. She agreed with plan.

## 2014-05-09 NOTE — Patient Instructions (Addendum)
It was nice seeing you today Makayla Vasquez and am glad you feel much better after starting your antibiotic, please complete the course of therapy. I also will like to notify you about your CT scan done last year that showed possible lung nodule, due to hx of smoking, I will recommend repeat CT, please discuss this with your PCP at next visit.  NB: I verbally informed patient to cover her nose if she is going to be around people, she is instructed to rest at home for few days before going back to work.

## 2014-05-09 NOTE — Assessment & Plan Note (Signed)
A1C looks ok today. My continue Metformin.

## 2014-05-10 ENCOUNTER — Ambulatory Visit (INDEPENDENT_AMBULATORY_CARE_PROVIDER_SITE_OTHER): Payer: Medicare Other | Admitting: *Deleted

## 2014-05-10 VITALS — BP 130/70 | HR 76

## 2014-05-10 DIAGNOSIS — R0902 Hypoxemia: Secondary | ICD-10-CM | POA: Diagnosis not present

## 2014-05-10 NOTE — Progress Notes (Signed)
   Pt in nurse clinic for O2 stat at rest and ambulatory per Dr. Lum Babe.  O2 at rest 92% room air; ambulatory 86-90% heart rate increased to 96-101. Pt rest back in nurse office for about 5-10 minutes O2 stat increased 92-96% room air.  Pt denied any SOB, chest pain or dizziness at rest or with ambulation.  Will forward to Dr. Lum Babe.  Clovis Pu, RN'

## 2014-05-11 ENCOUNTER — Telehealth: Payer: Self-pay | Admitting: Family Medicine

## 2014-05-11 NOTE — Telephone Encounter (Signed)
I called to check on patient following nurse visit yesterday. Message left for her to call back if necessary.

## 2014-05-11 NOTE — Telephone Encounter (Signed)
I spoke with patient she is fine except for headache which started today since she had not eaten all day. Patient advised to rest and use Ibuprofen as needed for HA. She is also instructed to call or go to the ED if having dyspnea. She verbalized understanding.

## 2014-05-18 ENCOUNTER — Ambulatory Visit: Payer: Medicare Other | Admitting: Family Medicine

## 2014-05-22 ENCOUNTER — Ambulatory Visit (INDEPENDENT_AMBULATORY_CARE_PROVIDER_SITE_OTHER): Payer: Medicare Other | Admitting: Family Medicine

## 2014-05-22 ENCOUNTER — Encounter: Payer: Self-pay | Admitting: Family Medicine

## 2014-05-22 VITALS — BP 113/80 | HR 69 | Temp 98.4°F | Ht 66.0 in | Wt 230.3 lb

## 2014-05-22 DIAGNOSIS — J189 Pneumonia, unspecified organism: Secondary | ICD-10-CM | POA: Diagnosis not present

## 2014-05-22 DIAGNOSIS — N898 Other specified noninflammatory disorders of vagina: Secondary | ICD-10-CM

## 2014-05-22 DIAGNOSIS — L293 Anogenital pruritus, unspecified: Secondary | ICD-10-CM

## 2014-05-22 DIAGNOSIS — J302 Other seasonal allergic rhinitis: Secondary | ICD-10-CM

## 2014-05-22 DIAGNOSIS — J3089 Other allergic rhinitis: Secondary | ICD-10-CM | POA: Diagnosis not present

## 2014-05-22 DIAGNOSIS — J310 Chronic rhinitis: Secondary | ICD-10-CM | POA: Insufficient documentation

## 2014-05-22 MED ORDER — FLUCONAZOLE 150 MG PO TABS: 150.0000 mg | ORAL_TABLET | Freq: Once | ORAL | Status: DC

## 2014-05-22 MED ORDER — CETIRIZINE HCL 10 MG PO TABS: 10.0000 mg | ORAL_TABLET | Freq: Every day | ORAL | Status: DC

## 2014-05-22 NOTE — Assessment & Plan Note (Signed)
Hx consistent with vaginal yeast infection - Recent abx course  Plan: 1. Diflucan 150mg  PO (repeat dose Day 3 if unresolved) 2. RTC if worsening, or discharge, would recommend wet prep at next visit if still present

## 2014-05-22 NOTE — Assessment & Plan Note (Addendum)
Consistent with overall clinical improvement / resolution - Afebrile, stable vitals, no subjective SOB / CP - Compliant with abx - Previously at last OV some concern for PE in setting of low O2 sat  Plan: 1. Complete antibiotic course 2. Discontinue Mucinex - likely triggering cough / mucus production 3. No plan to repeat CXR in setting of clinical resolution, no focal findings on pulm exam 4. Start Zyrtec in setting of sinus drainage hx allergies 5. RTC precautions if worsening SOB / CP / fever, in future would have low threshold for repeat CXR and/or CT Chest to eval for PE if low O2 sat

## 2014-05-22 NOTE — Assessment & Plan Note (Signed)
Consistent with seasonal allergies / allergic rhinitis, likely contributing to inc mucus production / sinus drainage  Plan: 1. Start Zyrtec 10mg  daily 2. Continue previously rx Flonase

## 2014-05-22 NOTE — Progress Notes (Signed)
Subjective:     Patient ID: Makayla Vasquez, female   DOB: 1981/06/05, 33 y.o.   MRN: [id number]  HPI  Follow-up from previous visit 7/20-7/21 dx with CAP  - Reports feeling better, but complains of persistent mucus and occasional cough, states when she bends forward and feels sinus pressure and headache - Previously presented to OV 7/20 due to SOB and congestion - Compliant with antibiotics, still taking Augmentin (2 more days left) completed Doxy, mucinex - Denies SOB, chest pain, fevers/chills, n/v  Yeast Infection - Complains of yeast infection with vaginal itching, no discharge or rash, after antibiotics, prior hx of yeast infections - Taking OTC monostat with some relief  Allergies - Prior hx seasonal allergies - Attributes some of cough / increased mucus to being outside - No longer on anti-allergy medicines   I have reviewed and updated the following as appropriate: allergies and current medications  Social Hx: Former smoker (quit 1997)  Review of Systems  See above HPI    Objective:   Physical Exam  BP 113/80  Pulse 69  Temp(Src) 98.4 F (36.9 C) (Oral)  Ht 5\' 6"  (1.676 m)  Wt 230 lb 4.8 oz (104.463 kg)  BMI 37.19 kg/m2  Gen - obese, well-appearing, NAD HEENT - NCAT, mild maxillary sinus tenderness b/l, PERRL, EOMI, mild b/l edematous nasal turbinates, oropharynx clear, MMM Neck - supple, non-tender Heart - RRR, no murmurs heard Lungs - CTAB, no wheezing, crackles, or rhonchi. Normal work of breathing. Ext - No calf tenderness, no edema, intact peripheral pulses Skin - warm, dry, no rashes Neuro - awake, alert, oriented     Assessment:     See specific A&P problem list for details.      Plan:     See specific A&P problem list for details.

## 2014-05-22 NOTE — Patient Instructions (Signed)
Dear Makayla Vasquez, Thank you for coming in to clinic today. It was good to see you!  Today we discussed your Pneumonia Follow-up, Vaginal Itching, and Allergies. 1. Looks like you are doing better since treated for Pneumonia. Please finish your antibiotics. 2. To reduce mucus and cough, please stop taking Mucinex. Increase hydration with additional water daily 3. Start Zyrtec 10mg  daily for allergies. Use warm compresses for sinus pressure and sinus massage as demonstrated. May try over the counter Nasal Saline as needed.  Some important numbers from today's visit: BP - 113/80  Please schedule a follow-up appointment with me (Dr. ) in 1-2 weeks if you are having worsening symptoms, fever/chills, cough, headaches sinus pressure.  If you have any other questions or concerns, please feel free to call the clinic to contact me. You may also schedule an earlier appointment if necessary.  However, if your symptoms get significantly worse, please go to the Emergency Department to seek immediate medical attention.  Althea Charon, DO [facility]

## 2014-05-26 ENCOUNTER — Encounter (HOSPITAL_COMMUNITY): Payer: Self-pay | Admitting: Emergency Medicine

## 2014-05-26 ENCOUNTER — Emergency Department (HOSPITAL_COMMUNITY): Payer: Medicare Other

## 2014-05-26 ENCOUNTER — Other Ambulatory Visit: Payer: Self-pay

## 2014-05-26 ENCOUNTER — Telehealth: Payer: Self-pay | Admitting: Family Medicine

## 2014-05-26 ENCOUNTER — Ambulatory Visit (INDEPENDENT_AMBULATORY_CARE_PROVIDER_SITE_OTHER): Payer: Medicare Other | Admitting: Family Medicine

## 2014-05-26 ENCOUNTER — Inpatient Hospital Stay (HOSPITAL_COMMUNITY)
Admission: EM | Admit: 2014-05-26 | Discharge: 2014-06-02 | DRG: 193 | Disposition: A | Discharge status: Home / Self Care | Payer: Medicare Other | Attending: Family Medicine | Admitting: Family Medicine

## 2014-05-26 ENCOUNTER — Ambulatory Visit (HOSPITAL_COMMUNITY)
Admission: RE | Admit: 2014-05-26 | Discharge: 2014-05-26 | Disposition: A | Discharge status: Home / Self Care | Payer: Medicare Other | Source: Ambulatory Visit | Attending: Family Medicine | Admitting: Family Medicine

## 2014-05-26 ENCOUNTER — Encounter: Payer: Self-pay | Admitting: Family Medicine

## 2014-05-26 VITALS — BP 123/83 | HR 91 | Temp 98.3°F | Wt 226.0 lb

## 2014-05-26 DIAGNOSIS — N179 Acute kidney failure, unspecified: Secondary | ICD-10-CM | POA: Diagnosis not present

## 2014-05-26 DIAGNOSIS — Z950 Presence of cardiac pacemaker: Secondary | ICD-10-CM

## 2014-05-26 DIAGNOSIS — Q246 Congenital heart block: Secondary | ICD-10-CM

## 2014-05-26 DIAGNOSIS — Z8249 Family history of ischemic heart disease and other diseases of the circulatory system: Secondary | ICD-10-CM

## 2014-05-26 DIAGNOSIS — R0602 Shortness of breath: Secondary | ICD-10-CM

## 2014-05-26 DIAGNOSIS — J1289 Other viral pneumonia: Principal | ICD-10-CM | POA: Diagnosis present

## 2014-05-26 DIAGNOSIS — Z6836 Body mass index (BMI) 36.0-36.9, adult: Secondary | ICD-10-CM

## 2014-05-26 DIAGNOSIS — I5022 Chronic systolic (congestive) heart failure: Secondary | ICD-10-CM | POA: Diagnosis not present

## 2014-05-26 DIAGNOSIS — J189 Pneumonia, unspecified organism: Secondary | ICD-10-CM | POA: Diagnosis not present

## 2014-05-26 DIAGNOSIS — I420 Dilated cardiomyopathy: Secondary | ICD-10-CM | POA: Diagnosis present

## 2014-05-26 DIAGNOSIS — M069 Rheumatoid arthritis, unspecified: Secondary | ICD-10-CM | POA: Diagnosis present

## 2014-05-26 DIAGNOSIS — K589 Irritable bowel syndrome without diarrhea: Secondary | ICD-10-CM | POA: Diagnosis present

## 2014-05-26 DIAGNOSIS — I5023 Acute on chronic systolic (congestive) heart failure: Secondary | ICD-10-CM | POA: Diagnosis present

## 2014-05-26 DIAGNOSIS — R0902 Hypoxemia: Secondary | ICD-10-CM | POA: Diagnosis not present

## 2014-05-26 DIAGNOSIS — Z9089 Acquired absence of other organs: Secondary | ICD-10-CM

## 2014-05-26 DIAGNOSIS — J45909 Unspecified asthma, uncomplicated: Secondary | ICD-10-CM | POA: Diagnosis present

## 2014-05-26 DIAGNOSIS — K219 Gastro-esophageal reflux disease without esophagitis: Secondary | ICD-10-CM | POA: Diagnosis present

## 2014-05-26 DIAGNOSIS — I959 Hypotension, unspecified: Secondary | ICD-10-CM | POA: Diagnosis not present

## 2014-05-26 DIAGNOSIS — I509 Heart failure, unspecified: Secondary | ICD-10-CM | POA: Diagnosis present

## 2014-05-26 DIAGNOSIS — Z79899 Other long term (current) drug therapy: Secondary | ICD-10-CM

## 2014-05-26 DIAGNOSIS — I499 Cardiac arrhythmia, unspecified: Secondary | ICD-10-CM

## 2014-05-26 DIAGNOSIS — IMO0002 Reserved for concepts with insufficient information to code with codable children: Secondary | ICD-10-CM

## 2014-05-26 DIAGNOSIS — N390 Urinary tract infection, site not specified: Secondary | ICD-10-CM

## 2014-05-26 DIAGNOSIS — A599 Trichomoniasis, unspecified: Secondary | ICD-10-CM

## 2014-05-26 DIAGNOSIS — B9789 Other viral agents as the cause of diseases classified elsewhere: Secondary | ICD-10-CM | POA: Diagnosis present

## 2014-05-26 DIAGNOSIS — R7303 Prediabetes: Secondary | ICD-10-CM | POA: Diagnosis present

## 2014-05-26 DIAGNOSIS — J9600 Acute respiratory failure, unspecified whether with hypoxia or hypercapnia: Secondary | ICD-10-CM | POA: Diagnosis present

## 2014-05-26 DIAGNOSIS — Z87891 Personal history of nicotine dependence: Secondary | ICD-10-CM

## 2014-05-26 DIAGNOSIS — E119 Type 2 diabetes mellitus without complications: Secondary | ICD-10-CM | POA: Diagnosis present

## 2014-05-26 DIAGNOSIS — I428 Other cardiomyopathies: Secondary | ICD-10-CM | POA: Diagnosis not present

## 2014-05-26 DIAGNOSIS — I10 Essential (primary) hypertension: Secondary | ICD-10-CM | POA: Diagnosis present

## 2014-05-26 DIAGNOSIS — J9819 Other pulmonary collapse: Secondary | ICD-10-CM | POA: Diagnosis not present

## 2014-05-26 DIAGNOSIS — F319 Bipolar disorder, unspecified: Secondary | ICD-10-CM | POA: Diagnosis present

## 2014-05-26 DIAGNOSIS — J4520 Mild intermittent asthma, uncomplicated: Secondary | ICD-10-CM

## 2014-05-26 DIAGNOSIS — J984 Other disorders of lung: Secondary | ICD-10-CM | POA: Diagnosis not present

## 2014-05-26 LAB — COMPREHENSIVE METABOLIC PANEL
ALT: 16 U/L (ref 0–35)
ANION GAP: 15 (ref 5–15)
AST: 23 U/L (ref 0–37)
Albumin: 3.6 g/dL (ref 3.5–5.2)
Alkaline Phosphatase: 70 U/L (ref 39–117)
BUN: 9 mg/dL (ref 6–23)
CO2: 23 mEq/L (ref 19–32)
CREATININE: 0.93 mg/dL (ref 0.50–1.10)
Calcium: 9.2 mg/dL (ref 8.4–10.5)
Chloride: 103 mEq/L (ref 96–112)
GFR calc Af Amer: >90 mL/min (ref >90)
GFR, EST NON AFRICAN AMERICAN: 80 mL/min — AB (ref >90)
GLUCOSE: 96 mg/dL (ref 70–99)
Potassium: 3.9 mEq/L (ref 3.7–5.3)
SODIUM: 141 meq/L (ref 137–147)
TOTAL PROTEIN: 8.5 g/dL — AB (ref 6.0–8.3)
Total Bilirubin: 0.8 mg/dL (ref 0.3–1.2)

## 2014-05-26 LAB — URINE MICROSCOPIC-ADD ON

## 2014-05-26 LAB — CBC WITH DIFFERENTIAL/PLATELET
Basophils Absolute: 0 10*3/uL (ref 0.0–0.1)
Basophils Relative: 1 % (ref 0–1)
EOS ABS: 0.2 10*3/uL (ref 0.0–0.7)
Eosinophils Relative: 3 % (ref 0–5)
HEMATOCRIT: 43.4 % (ref 36.0–46.0)
Hemoglobin: 14.4 g/dL (ref 12.0–15.0)
Lymphocytes Relative: 31 % (ref 12–46)
Lymphs Abs: 1.5 10*3/uL (ref 0.7–4.0)
MCH: 28.5 pg (ref 26.0–34.0)
MCHC: 33.2 g/dL (ref 30.0–36.0)
MCV: 85.9 fL (ref 78.0–100.0)
MONO ABS: 0.4 10*3/uL (ref 0.1–1.0)
Monocytes Relative: 7 % (ref 3–12)
Neutro Abs: 2.9 10*3/uL (ref 1.7–7.7)
Neutrophils Relative %: 58 % (ref 43–77)
PLATELETS: 256 10*3/uL (ref 150–400)
RBC: 5.05 MIL/uL (ref 3.87–5.11)
RDW: 13.8 % (ref 11.5–15.5)
WBC: 5 10*3/uL (ref 4.0–10.5)

## 2014-05-26 LAB — PRO B NATRIURETIC PEPTIDE: Pro B Natriuretic peptide (BNP): 16.8 pg/mL (ref 0–125)

## 2014-05-26 LAB — URINALYSIS, ROUTINE W REFLEX MICROSCOPIC
Glucose, UA: NEGATIVE mg/dL
Ketones, ur: NEGATIVE mg/dL
NITRITE: NEGATIVE
PROTEIN: NEGATIVE mg/dL
Specific Gravity, Urine: 1.025 (ref 1.005–1.030)
UROBILINOGEN UA: 1 mg/dL (ref 0.0–1.0)
pH: 6 (ref 5.0–8.0)

## 2014-05-26 LAB — POC URINE PREG, ED: Preg Test, Ur: NEGATIVE

## 2014-05-26 MED ORDER — IOHEXOL 350 MG/ML SOLN
100.0000 mL | Freq: Once | INTRAVENOUS | Status: AC | PRN
  Administered 2014-05-26: 100 mL via INTRAVENOUS

## 2014-05-26 MED ORDER — PIPERACILLIN-TAZOBACTAM 3.375 G IVPB: 3.3750 g | Freq: Once | INTRAVENOUS | Status: DC

## 2014-05-26 MED ORDER — VANCOMYCIN HCL IN DEXTROSE 1-5 GM/200ML-% IV SOLN
1000.0000 mg | Freq: Once | INTRAVENOUS | Status: DC
  Filled 2014-05-26: qty 200

## 2014-05-26 MED ORDER — SODIUM CHLORIDE 0.9 % IV BOLUS (SEPSIS)
1000.0000 mL | Freq: Once | INTRAVENOUS | Status: AC
  Administered 2014-05-26: 1000 mL via INTRAVENOUS

## 2014-05-26 MED ORDER — PIPERACILLIN-TAZOBACTAM 3.375 G IVPB 30 MIN
3.3750 g | Freq: Once | INTRAVENOUS | Status: AC
  Administered 2014-05-27: 3.375 g via INTRAVENOUS
  Filled 2014-05-26: qty 50

## 2014-05-26 MED ORDER — ALBUTEROL SULFATE HFA 108 (90 BASE) MCG/ACT IN AERS
4.0000 | INHALATION_SPRAY | Freq: Once | RESPIRATORY_TRACT | Status: AC
  Administered 2014-05-26: 4 via RESPIRATORY_TRACT
  Filled 2014-05-26: qty 6.7

## 2014-05-26 NOTE — ED Provider Notes (Signed)
CSN: [id number]     Arrival date & time 05/26/14  1958 History   First MD Initiated Contact with Patient 05/26/14 2022     Chief Complaint  Patient presents with  . Shortness of Breath     (Consider location/radiation/quality/duration/timing/severity/associated sxs/prior Treatment) Patient is a 33 y.o. female presenting with shortness of breath. The history is provided by the patient. No language interpreter was used.  Shortness of Breath Severity:  Moderate Onset quality:  Gradual Duration:  2 weeks Timing:  Constant Progression:  Worsening Chronicity:  New Relieved by:  Rest and oxygen Worsened by:  Exertion and movement Ineffective treatments: abx. Associated symptoms: cough and fever   Associated symptoms: no abdominal pain, no chest pain, no diaphoresis, no headaches, no neck pain, no sore throat and no vomiting   Cough:    Cough characteristics:  Productive   Duration:  2 weeks   Timing:  Constant   Progression:  Worsening Risk factors: obesity   Risk factors: no recent surgery     Past Medical History  Diagnosis Date  . Heart palpitations   . Cardiac pacemaker     Since age 33. Last generator change in 2004  . Congenital complete AV block   . Obesity   . GERD (gastroesophageal reflux disease)   . Asymptomatic LV dysfunction     Echo in Dec 2011 with EF 35 to 40%. Felt to be due to paced rhythm  . Seizures     as a child- from high fever  . Anxiety   . Bipolar affective disorder   . Depression     bipolar  . Pacemaker     4-chamber  . Heart murmur     when I was little  . Carpal tunnel syndrome of right wrist   . Constipation   . Anemia     after child birth was put on Iron.  . Seasonal allergies   . CHF (congestive heart failure)     R/T 4-chamber pacemaker  . Automatic implantable cardioverter-defibrillator in situ     medtronic pacemaker   . Asthma     seasonal allergies   . Arthritis     rheumatoid arthritis- mild, no rheumatology care     Past Surgical History  Procedure Laterality Date  . Throat surgery  1994    s/p laser treatment  . Cesarean section    . Insert / replace / remove pacemaker      2001  . Cholecystectomy    . Iud removal  11/03/2011    Procedure: INTRAUTERINE DEVICE (IUD) REMOVAL;  Surgeon: Myra C. Marice Potterove, MD;  Location: [facility];  Service: Gynecology;  Laterality: N/A;  . Cyst excision  12/10/2012    THYROID  . Thyroglossal duct cyst N/A 12/10/2012    Procedure: REVISION OF THYROGLOSSAL DUCT CYST EXCISION;  Surgeon: Serena ColonelJefry Rosen, MD;  [facility];  Service: ENT;  Laterality: N/A;  Revision of Thyroglossal Duct Cyst Excision  . Nasal fracture surgery      /w plate   . Carpal tunnel with cubital tunnel Right 07/26/2013    Procedure: RIGHT LIMITED OPEN CARPAL TUNNEL RELEASE ,  RIGHT CUBITAL TUNNEL RELEASE, INSITU VERSES ULNAR NERVE DECOMPRESSION AND ANTERIOR TRANSPOSITION;  Surgeon: Dominica SeverinWilliam Gramig, MD;  Location: [facility] OR;  Service: Orthopedics;  Laterality: Right;   Family History  Problem Relation Age of Onset  . Heart disease Mother   . Hypertension Mother   . Heart disease Father   . Heart disease Sister  cardiomegaly and atherosclerosis   History  Substance Use Topics  . Smoking status: Former Smoker -- 0.25 packs/day for .5 years    Types: Cigarettes    Quit date: 07/25/1996  . Smokeless tobacco: Never Used  . Alcohol Use: No   OB History   Grav Para Term Preterm Abortions TAB SAB Ect Mult Living   1 1 1       1      Review of Systems  Constitutional: Positive for fever. Negative for chills, diaphoresis, activity change, appetite change and fatigue.  HENT: Negative for congestion, facial swelling, rhinorrhea and sore throat.   Eyes: Negative for photophobia and discharge.  Respiratory: Positive for cough and shortness of breath. Negative for chest tightness.   Cardiovascular: Negative for chest pain, palpitations and leg swelling.  Gastrointestinal: Negative for nausea, vomiting,  abdominal pain and diarrhea.  Endocrine: Negative for polydipsia and polyuria.  Genitourinary: Negative for dysuria, frequency, difficulty urinating and pelvic pain.  Musculoskeletal: Negative for arthralgias, back pain, neck pain and neck stiffness.  Skin: Negative for color change and wound.  Allergic/Immunologic: Negative for immunocompromised state.  Neurological: Negative for facial asymmetry, weakness, numbness and headaches.  Hematological: Does not bruise/bleed easily.  Psychiatric/Behavioral: Negative for confusion and agitation.      Allergies  Sertraline hcl  Home Medications   Prior to Admission medications   Medication Sig Start Date End Date Taking? Authorizing Provider  albuterol (PROVENTIL HFA;VENTOLIN HFA) 108 (90 BASE) MCG/ACT inhaler Inhale 2 puffs into the lungs every 4 (four) hours as needed for wheezing or shortness of breath. 02/23/14  Yes Waymon Budge, MD  Azelastine-Fluticasone (DYMISTA) 137-50 MCG/ACT SUSP Place 1-2 puffs into the nose at bedtime. 04/06/14 04/06/15 Yes Waymon Budge, MD  budesonide-formoterol (SYMBICORT) 80-4.5 MCG/ACT inhaler Inhale 2 puffs into the lungs 2 (two) times daily. 2 puffs then rinse mouth, twice daily 04/06/14  Yes Waymon Budge, MD  carvedilol (COREG) 3.125 MG tablet Take 1 tablet (3.125 mg total) by mouth 2 (two) times daily with a meal. 01/18/14  Yes Charm Rings, MD  cetirizine (ZYRTEC) 10 MG tablet Take 1 tablet (10 mg total) by mouth daily. 05/22/14  Yes Alexander Althea Charon, DO  fluticasone (FLONASE) 50 MCG/ACT nasal spray Place 2 sprays into both nostrils daily. 12/01/13  Yes Charm Rings, MD  metFORMIN (GLUCOPHAGE) 500 MG tablet Take 1 tablet (500 mg total) by mouth 2 (two) times daily with a meal. 01/18/14  Yes Charm Rings, MD  fluconazole (DIFLUCAN) 150 MG tablet Take 1 tablet (150 mg total) by mouth once. Repeat dose on Day 3 if persistent symptoms. 05/22/14   Alexander Althea Charon, DO   BP 142/92  Pulse 93  Temp(Src) 98.8  F (37.1 C) (Oral)  Resp 22  SpO2 92% Physical Exam  Constitutional: She is oriented to person, place, and time. She appears well-developed and well-nourished. No distress.  HENT:  Head: Normocephalic and atraumatic.  Mouth/Throat: No oropharyngeal exudate.  Eyes: Pupils are equal, round, and reactive to light.  Neck: Normal range of motion. Neck supple.  Cardiovascular: Normal rate, regular rhythm and normal heart sounds.  Exam reveals no gallop and no friction rub.   No murmur heard. Pulmonary/Chest: Effort normal. No respiratory distress. She has no wheezes. She has rales in the right lower field and the left lower field.  Abdominal: Soft. Bowel sounds are normal. She exhibits no distension and no mass. There is no tenderness. There is no rebound and no guarding.  Musculoskeletal: Normal range of motion. She exhibits no edema and no tenderness.  Neurological: She is alert and oriented to person, place, and time.  Skin: Skin is warm and dry.  Psychiatric: She has a normal mood and affect.    ED Course  Procedures (including critical care time) Labs Review Labs Reviewed  COMPREHENSIVE METABOLIC PANEL - Abnormal; Notable for the following:    Total Protein 8.5 (*)    GFR calc non Af Amer 80 (*)    All other components within normal limits  URINALYSIS, ROUTINE W REFLEX MICROSCOPIC - Abnormal; Notable for the following:    Color, Urine AMBER (*)    APPearance CLOUDY (*)    Hgb urine dipstick MODERATE (*)    Bilirubin Urine SMALL (*)    Leukocytes, UA MODERATE (*)    All other components within normal limits  URINE MICROSCOPIC-ADD ON - Abnormal; Notable for the following:    Squamous Epithelial / LPF MANY (*)    Bacteria, UA FEW (*)    All other components within normal limits  URINE CULTURE  CULTURE, BLOOD (ROUTINE X 2)  CULTURE, BLOOD (ROUTINE X 2)  CBC WITH DIFFERENTIAL  PRO B NATRIURETIC PEPTIDE  HIV ANTIBODY (ROUTINE TESTING)  POC URINE PREG, ED    Imaging  Review Dg Chest 2 View  05/26/2014   CLINICAL DATA:  Shortness of breath and hypoxia.  EXAM: CHEST  2 VIEW  COMPARISON:  05/08/2014  FINDINGS: Left-sided 3 lead pacemaker remains in place. Cardiac silhouette remains mildly enlarged. Patchy opacities in the mid and lower lungs bilaterally do not appear significantly changed. No pleural effusion or pneumothorax is identified. Right upper quadrant abdominal surgical clips are noted. No acute osseous abnormality is seen.  IMPRESSION: Unchanged bilateral lung opacities consistent with pneumonia.   Electronically Signed   By: Sebastian Ache   On: 05/26/2014 14:15   Ct Angio Chest Pe W/cm &/or Wo Cm  05/26/2014   CLINICAL DATA:  Shortness of breath, hypoxia, cough, has failed outpatient pneumonia treatment  EXAM: CT ANGIOGRAPHY CHEST WITH CONTRAST  TECHNIQUE: Multidetector CT imaging of the chest was performed using the standard protocol during bolus administration of intravenous contrast. Multiplanar CT image reconstructions and MIPs were obtained to evaluate the vascular anatomy.  CONTRAST:  OMNIPAQUE IOHEXOL 350 MG/ML SOLN  COMPARISON:  05/26/2014 radiographs, 05/08/14 radiograph  FINDINGS: Lower lobe pulmonary arteries are somewhat obscured by extensive bilateral lower lobe consolidation. There are no filling defects appreciated within the pulmonary arterial system. The thoracic aorta shows no dissection or dilatation there is a cardiac pacing device noted. There is no discrete enlarged lymph node, although lymphoid tissue in the subcarinal region and right hilum is mildly prominent. There is no significant pleural effusion. There is no significant pericardial effusion.  There are extensive bilateral pulmonary parenchymal opacities. There is relative sparing of the lung apices. Multifocal opacity is identified bilaterally, most severely in the lower lobes, but also involving the right middle lobe and the mid to inferior aspects of both upper lobes. Consolidation  is most severe at the lung bases were there are dense air bronchograms. In the upper lobes and in the mid lung zones, there is a somewhat nodular component to the infiltrate, with 1 area in the medial right upper lobe measuring 17 x 9 mm showing evidence of possible cavitation. This is seen on image 30 series 12.  Images through the upper abdomen are unremarkable. There are no acute musculoskeletal findings.  Review of the  MIP images confirms the above findings.  IMPRESSION: Severe bilateral multifocal lung opacities, concerning for severe pneumonia. There is evidence of a developing cavitary lesion in the right upper lobe as described above.  Although lower lobe pulmonary arteries are evaluated in somewhat limited detail due to the extensive consolidation of lung bases, there is no evidence of filling defect within the pulmonary arterial system appreciated to suggest pulmonary embolus.   Electronically Signed   By: Esperanza Heir M.D.   On: 05/26/2014 23:30     EKG Interpretation None      MDM   Final diagnoses:  None    Pt is a 33 y.o. female with Pmhx as above who presents with worseing SOB since being diagnosed w/ pna 2 weeks ago. She is currently on Augmentin. She presents hypoxic in the 80's on RA. CXR today with unchanged BL infiltrates. In the fam med clinci, plan was for outpt CT next week. Given her degree of hypoxia, will get CT today.   CT w/ severe BL multifocal pna, developing RUL cavitary lesion. No definite PE.  IV vanc/zosyn ordered Family medicine consulted for admission. She will be transferred to [facility].         Toy Cookey, MD 05/27/14 [phone]

## 2014-05-26 NOTE — Telephone Encounter (Signed)
I called the patient to discuss the results of her chest x-ray. It was unchanged at this time. I discussed this with Dr McDiarmid. Initially I felt that she had a PNA based on her initial CXR. Now with no changes on CXR following treatment of her potential PNA leading to the likelihood that this is not a PNA. Will need to obtain a CT chest high resolution with contrast to further evaluate for additional causes. I advised the patient of the findings and the plan. She was agreeable. She has an appointment for CT chest on August 12 at 11 am.

## 2014-05-26 NOTE — ED Notes (Signed)
Pt stuck multiple times for for blood draw and IV with attempts unsuccessful. IV team contacted.

## 2014-05-26 NOTE — Assessment & Plan Note (Addendum)
Patient has continued to have issues with shortness of breath following treatment for PNA. We repeated a CXR today that was unchanged from previously. Her infiltrates had not changed at all which begs the question of whether or not she initially had a PNA or if there was another cause to these infiltrates and her shortness of breath. I discussed the case with Dr McDiarmid and we agreed that proceeding with a high resolution CT chest with contrast to further evaluate these infiltrates would be the next best step. I called the patient and informed her of this. This study has been scheduled for August 12th at 11 am. Additionally on previous evaluation a BNP was obtained which was normal so it is unlikely this issue is related to her heart. Her wells score is 0 and there are no calf measurement discrepancies making a PE unlikely at this time. Hopefully the CT scan will shed further light into a cause of this shortness of breath and continued pulmonary infiltrates. Patient was given return precautions.

## 2014-05-26 NOTE — ED Notes (Signed)
Pt reports dx of PNA x 3 weeks ago by [facility] family practice and was given abx and sent home.  Had a chest xray today, states that PNA is not getting better and was instructed to come to the ED.  Pt reports SOB with exertion with productive cough, with greenish sputum.  Pt is A&Ox 4.

## 2014-05-26 NOTE — ED Notes (Signed)
RN will pull from IV for blood work.

## 2014-05-26 NOTE — Patient Instructions (Signed)

## 2014-05-26 NOTE — Progress Notes (Signed)
Patient ID: BRELYNN WHELLER, female   DOB: Aug 24, 1981, 33 y.o.   MRN: [id number]  Marikay Alar, MD Phone: [phone]  ERIAL FIKES is a 33 y.o. female who presents today for same day appointment.  Shortness of breath: she notes continued issues with this. She additionally has continued nasal congestion and cough (non-productive). She was seen on 05/08/2014 for similar symptoms and was diagnosed with bilateral bronchopneumonia by CXR. She was given Doxycycline and Augmentin. She was seen again on 05/22/2014 for continued symptoms and was told to start Zyrtec and Flonase. She does not feel that the Zyrtec and Flonase help her symptoms. She did not finish the Augmentin because it makes her itch. She has three pills left. She notes her nasal congestion, non-productive cough, fatigue, and shortness of breath have not improved. Her shortness of breath is worsened with exertion. She notes no fevers, though has had some chills. She is drinking fluids normally.  She denies fevers, ear pain,n/v/d, and chest pain. She denies leg pain or swelling. No history of blood clots in the past. 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   Patient is a former smoker.   ROS: Per HPI   Physical Exam Filed Vitals:   05/26/14 1117  BP: 123/83  Pulse: 91  Temp: 98.3 F (36.8 C)  O2 90% RA sitting, 85% RA w/ambulation   Gen: NAD, well appearing  HEENT: PERRL. EOM intact. Conjunctiva normal. TM's clear bilaterally. Nares mildly erythematous. No mucopurulent drainage. Oropharynx non-erythematous. MMM. No sinus pain.  Cardiac: RRR. No murmurs.  Resp: Mild coarse breath sounds in left lower lobe. Otherwise, CTA. Normal work of breathing. Able to speak in full sentences. Occasional coughs throughout exam.  Extremities: No LE edema. No erythema. No calf tenderness. Left calf 36.5 cm right calf 37.5 cm.  Assessment/Plan: Please see individual problem list.

## 2014-05-27 ENCOUNTER — Encounter (HOSPITAL_COMMUNITY): Payer: Self-pay | Admitting: *Deleted

## 2014-05-27 DIAGNOSIS — K219 Gastro-esophageal reflux disease without esophagitis: Secondary | ICD-10-CM | POA: Diagnosis present

## 2014-05-27 DIAGNOSIS — I509 Heart failure, unspecified: Secondary | ICD-10-CM | POA: Diagnosis present

## 2014-05-27 DIAGNOSIS — E119 Type 2 diabetes mellitus without complications: Secondary | ICD-10-CM | POA: Diagnosis present

## 2014-05-27 DIAGNOSIS — K589 Irritable bowel syndrome without diarrhea: Secondary | ICD-10-CM | POA: Diagnosis present

## 2014-05-27 DIAGNOSIS — J189 Pneumonia, unspecified organism: Secondary | ICD-10-CM | POA: Diagnosis not present

## 2014-05-27 DIAGNOSIS — J45909 Unspecified asthma, uncomplicated: Secondary | ICD-10-CM | POA: Diagnosis present

## 2014-05-27 DIAGNOSIS — J9819 Other pulmonary collapse: Secondary | ICD-10-CM | POA: Diagnosis present

## 2014-05-27 DIAGNOSIS — IMO0002 Reserved for concepts with insufficient information to code with codable children: Secondary | ICD-10-CM | POA: Diagnosis not present

## 2014-05-27 DIAGNOSIS — Z9089 Acquired absence of other organs: Secondary | ICD-10-CM | POA: Diagnosis not present

## 2014-05-27 DIAGNOSIS — N390 Urinary tract infection, site not specified: Secondary | ICD-10-CM | POA: Diagnosis not present

## 2014-05-27 DIAGNOSIS — F319 Bipolar disorder, unspecified: Secondary | ICD-10-CM | POA: Diagnosis present

## 2014-05-27 DIAGNOSIS — I10 Essential (primary) hypertension: Secondary | ICD-10-CM

## 2014-05-27 DIAGNOSIS — Z6836 Body mass index (BMI) 36.0-36.9, adult: Secondary | ICD-10-CM | POA: Diagnosis not present

## 2014-05-27 DIAGNOSIS — Z950 Presence of cardiac pacemaker: Secondary | ICD-10-CM | POA: Diagnosis not present

## 2014-05-27 DIAGNOSIS — I517 Cardiomegaly: Secondary | ICD-10-CM | POA: Diagnosis not present

## 2014-05-27 DIAGNOSIS — I428 Other cardiomyopathies: Secondary | ICD-10-CM | POA: Diagnosis present

## 2014-05-27 DIAGNOSIS — R0602 Shortness of breath: Secondary | ICD-10-CM | POA: Diagnosis present

## 2014-05-27 DIAGNOSIS — J9600 Acute respiratory failure, unspecified whether with hypoxia or hypercapnia: Secondary | ICD-10-CM | POA: Diagnosis present

## 2014-05-27 DIAGNOSIS — A599 Trichomoniasis, unspecified: Secondary | ICD-10-CM | POA: Diagnosis not present

## 2014-05-27 DIAGNOSIS — M069 Rheumatoid arthritis, unspecified: Secondary | ICD-10-CM | POA: Diagnosis present

## 2014-05-27 DIAGNOSIS — R7309 Other abnormal glucose: Secondary | ICD-10-CM

## 2014-05-27 DIAGNOSIS — I5022 Chronic systolic (congestive) heart failure: Secondary | ICD-10-CM | POA: Diagnosis present

## 2014-05-27 DIAGNOSIS — N179 Acute kidney failure, unspecified: Secondary | ICD-10-CM | POA: Diagnosis not present

## 2014-05-27 DIAGNOSIS — J984 Other disorders of lung: Secondary | ICD-10-CM | POA: Diagnosis not present

## 2014-05-27 DIAGNOSIS — B9789 Other viral agents as the cause of diseases classified elsewhere: Secondary | ICD-10-CM | POA: Diagnosis present

## 2014-05-27 DIAGNOSIS — Z8249 Family history of ischemic heart disease and other diseases of the circulatory system: Secondary | ICD-10-CM | POA: Diagnosis not present

## 2014-05-27 DIAGNOSIS — I959 Hypotension, unspecified: Secondary | ICD-10-CM | POA: Diagnosis not present

## 2014-05-27 DIAGNOSIS — Q246 Congenital heart block: Secondary | ICD-10-CM | POA: Diagnosis not present

## 2014-05-27 DIAGNOSIS — R918 Other nonspecific abnormal finding of lung field: Secondary | ICD-10-CM | POA: Diagnosis not present

## 2014-05-27 DIAGNOSIS — Z79899 Other long term (current) drug therapy: Secondary | ICD-10-CM | POA: Diagnosis not present

## 2014-05-27 DIAGNOSIS — J1289 Other viral pneumonia: Secondary | ICD-10-CM | POA: Diagnosis present

## 2014-05-27 DIAGNOSIS — Z87891 Personal history of nicotine dependence: Secondary | ICD-10-CM | POA: Diagnosis not present

## 2014-05-27 DIAGNOSIS — R0902 Hypoxemia: Secondary | ICD-10-CM | POA: Diagnosis not present

## 2014-05-27 DIAGNOSIS — I499 Cardiac arrhythmia, unspecified: Secondary | ICD-10-CM | POA: Diagnosis not present

## 2014-05-27 LAB — GLUCOSE, CAPILLARY
GLUCOSE-CAPILLARY: 81 mg/dL (ref 70–99)
Glucose-Capillary: 86 mg/dL (ref 70–99)
Glucose-Capillary: 143 mg/dL — ABNORMAL HIGH (ref 70–99)
Glucose-Capillary: 104 mg/dL — ABNORMAL HIGH (ref 70–99)
Glucose-Capillary: 93 mg/dL (ref 70–99)

## 2014-05-27 LAB — HIV ANTIBODY (ROUTINE TESTING W REFLEX): HIV 1&2 Ab, 4th Generation: NONREACTIVE

## 2014-05-27 LAB — MRSA PCR SCREENING: MRSA by PCR: NEGATIVE

## 2014-05-27 MED ORDER — MORPHINE SULFATE 2 MG/ML IJ SOLN: 2.0000 mg | INTRAMUSCULAR | Status: DC | PRN

## 2014-05-27 MED ORDER — DOCUSATE SODIUM 100 MG PO CAPS: 100.0000 mg | ORAL_CAPSULE | Freq: Two times a day (BID) | ORAL | Status: DC | PRN

## 2014-05-27 MED ORDER — AZELASTINE HCL 0.1 % NA SOLN
1.0000 | Freq: Every day | NASAL | Status: DC
  Administered 2014-05-27 – 2014-06-01 (×6): 1 via NASAL
  Filled 2014-05-27: qty 30

## 2014-05-27 MED ORDER — CETYLPYRIDINIUM CHLORIDE 0.05 % MT LIQD
7.0000 mL | Freq: Two times a day (BID) | OROMUCOSAL | Status: DC
  Administered 2014-05-27 – 2014-06-02 (×10): 7 mL via OROMUCOSAL

## 2014-05-27 MED ORDER — PIPERACILLIN-TAZOBACTAM 3.375 G IVPB
3.3750 g | Freq: Three times a day (TID) | INTRAVENOUS | Status: DC
  Administered 2014-05-27 – 2014-05-29 (×7): 3.375 g via INTRAVENOUS
  Filled 2014-05-27 (×9): qty 50

## 2014-05-27 MED ORDER — AZELASTINE-FLUTICASONE 137-50 MCG/ACT NA SUSP: 1.0000 | Freq: Every day | NASAL | Status: DC

## 2014-05-27 MED ORDER — ALBUTEROL SULFATE (2.5 MG/3ML) 0.083% IN NEBU: 3.0000 mL | INHALATION_SOLUTION | RESPIRATORY_TRACT | Status: DC | PRN

## 2014-05-27 MED ORDER — ENOXAPARIN SODIUM 40 MG/0.4ML ~~LOC~~ SOLN
40.0000 mg | SUBCUTANEOUS | Status: DC
  Administered 2014-05-27 – 2014-05-28 (×2): 40 mg via SUBCUTANEOUS
  Filled 2014-05-27 (×3): qty 0.4

## 2014-05-27 MED ORDER — LORATADINE 10 MG PO TABS
10.0000 mg | ORAL_TABLET | Freq: Every day | ORAL | Status: DC
  Administered 2014-05-27 – 2014-06-02 (×7): 10 mg via ORAL
  Filled 2014-05-27 (×7): qty 1

## 2014-05-27 MED ORDER — CARVEDILOL 3.125 MG PO TABS
3.1250 mg | ORAL_TABLET | Freq: Two times a day (BID) | ORAL | Status: DC
  Administered 2014-05-27 – 2014-05-31 (×7): 3.125 mg via ORAL
  Filled 2014-05-27 (×14): qty 1

## 2014-05-27 MED ORDER — INSULIN ASPART 100 UNIT/ML ~~LOC~~ SOLN
0.0000 [IU] | Freq: Three times a day (TID) | SUBCUTANEOUS | Status: DC
Start: 2014-05-27 — End: 2014-06-02
  Administered 2014-05-27 – 2014-05-29 (×2): 1 [IU] via SUBCUTANEOUS
  Administered 2014-06-02: 2 [IU] via SUBCUTANEOUS

## 2014-05-27 MED ORDER — ACETAMINOPHEN 650 MG RE SUPP: 650.0000 mg | Freq: Four times a day (QID) | RECTAL | Status: DC | PRN

## 2014-05-27 MED ORDER — ONDANSETRON HCL 4 MG/2ML IJ SOLN: 4.0000 mg | Freq: Four times a day (QID) | INTRAMUSCULAR | Status: DC | PRN

## 2014-05-27 MED ORDER — BOOST / RESOURCE BREEZE PO LIQD
1.0000 | Freq: Two times a day (BID) | ORAL | Status: DC
  Administered 2014-05-28: 1 via ORAL

## 2014-05-27 MED ORDER — SODIUM CHLORIDE 0.45 % IV SOLN
INTRAVENOUS | Status: DC
  Administered 2014-05-27: 100 mL/h via INTRAVENOUS

## 2014-05-27 MED ORDER — INSULIN ASPART 100 UNIT/ML ~~LOC~~ SOLN: 0.0000 [IU] | Freq: Every day | SUBCUTANEOUS | Status: DC

## 2014-05-27 MED ORDER — FLUTICASONE PROPIONATE 50 MCG/ACT NA SUSP
1.0000 | Freq: Every day | NASAL | Status: DC
  Administered 2014-05-27 – 2014-06-01 (×6): 1 via NASAL
  Filled 2014-05-27: qty 16

## 2014-05-27 MED ORDER — SODIUM CHLORIDE 0.9 % IV SOLN: INTRAVENOUS | Status: DC

## 2014-05-27 MED ORDER — BUDESONIDE-FORMOTEROL FUMARATE 80-4.5 MCG/ACT IN AERO
2.0000 | INHALATION_SPRAY | Freq: Two times a day (BID) | RESPIRATORY_TRACT | Status: DC
  Administered 2014-05-27 – 2014-06-02 (×13): 2 via RESPIRATORY_TRACT
  Filled 2014-05-27 (×2): qty 6.9

## 2014-05-27 MED ORDER — ACETAMINOPHEN 325 MG PO TABS
650.0000 mg | ORAL_TABLET | Freq: Four times a day (QID) | ORAL | Status: DC | PRN
  Administered 2014-05-28 – 2014-05-30 (×2): 650 mg via ORAL
  Filled 2014-05-27 (×2): qty 2

## 2014-05-27 MED ORDER — VANCOMYCIN HCL IN DEXTROSE 1-5 GM/200ML-% IV SOLN
1000.0000 mg | Freq: Three times a day (TID) | INTRAVENOUS | Status: DC
  Administered 2014-05-27 – 2014-05-30 (×9): 1000 mg via INTRAVENOUS
  Filled 2014-05-27 (×12): qty 200

## 2014-05-27 MED ORDER — CHLORHEXIDINE GLUCONATE 0.12 % MT SOLN
15.0000 mL | Freq: Two times a day (BID) | OROMUCOSAL | Status: DC
  Administered 2014-05-27 – 2014-06-02 (×13): 15 mL via OROMUCOSAL
  Filled 2014-05-27 (×16): qty 15

## 2014-05-27 MED ORDER — METRONIDAZOLE 500 MG PO TABS
2000.0000 mg | ORAL_TABLET | Freq: Once | ORAL | Status: AC
  Administered 2014-05-27: 2000 mg via ORAL
  Filled 2014-05-27: qty 4

## 2014-05-27 MED ORDER — ONDANSETRON HCL 4 MG PO TABS: 4.0000 mg | ORAL_TABLET | Freq: Four times a day (QID) | ORAL | Status: DC | PRN

## 2014-05-27 MED ORDER — VANCOMYCIN HCL 10 G IV SOLR
2000.0000 mg | Freq: Once | INTRAVENOUS | Status: AC
  Administered 2014-05-27: 2000 mg via INTRAVENOUS
  Filled 2014-05-27: qty 2000

## 2014-05-27 NOTE — H&P (Signed)
FMTS ATTENDING ADMISSION NOTE Danie Hannig,MD I  have seen and examined this patient, reviewed their chart. I have discussed this patient with the resident. I agree with the resident's findings, assessment and care plan.  33 Y/O F with PMX of congenital complete AV block, CHF,,PreDm,HTN, Cardiac transplant, PPM, presented to the hospital with hx of cough and SOB for about 1-2 wks, associated with whitish sputum production, denies fever, occasional chest pain with cough. 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 Cough and breathing improved some now.  No current facility-administered medications on file prior to encounter.   Current Outpatient Prescriptions on File Prior to Encounter  Medication Sig Dispense Refill  . albuterol (PROVENTIL HFA;VENTOLIN HFA) 108 (90 BASE) MCG/ACT inhaler Inhale 2 puffs into the lungs every 4 (four) hours as needed for wheezing or shortness of breath.  1 each  12  . Azelastine-Fluticasone (DYMISTA) 137-50 MCG/ACT SUSP Place 1-2 puffs into the nose at bedtime.  1 Bottle  prn  . carvedilol (COREG) 3.125 MG tablet Take 1 tablet (3.125 mg total) by mouth 2 (two) times daily with a meal.  60 tablet  11  . cetirizine (ZYRTEC) 10 MG tablet Take 1 tablet (10 mg total) by mouth daily.  30 tablet  3  . fluticasone (FLONASE) 50 MCG/ACT nasal spray Place 2 sprays into both nostrils daily.  16 g  6  . metFORMIN (GLUCOPHAGE) 500 MG tablet Take 1 tablet (500 mg total) by mouth 2 (two) times daily with a meal.  60 tablet  3  . fluconazole (DIFLUCAN) 150 MG tablet Take 1 tablet (150 mg total) by mouth once. Repeat dose on Day 3 if persistent symptoms.  2 tablet  0   Past Medical History  Diagnosis Date  . Heart palpitations   . Cardiac pacemaker     Since age 55. Last generator change in 2004  . Congenital complete AV block   . Obesity   . GERD (gastroesophageal reflux disease)   . Asymptomatic LV dysfunction     Echo in Dec 2011 with EF 35 to  40%. Felt to be due to paced rhythm  . Seizures     as a child- from high fever  . Anxiety   . Bipolar affective disorder   . Depression     bipolar  . Pacemaker     4-chamber  . Heart murmur     when I was little  . Carpal tunnel syndrome of right wrist   . Constipation   . Anemia     after child birth was put on Iron.  . Seasonal allergies   . CHF (congestive heart failure)     R/T 4-chamber pacemaker  . Automatic implantable cardioverter-defibrillator in situ     medtronic pacemaker   . Asthma     seasonal allergies   . Arthritis     rheumatoid arthritis- mild, no rheumatology care    Filed Vitals:   05/26/14 2347 05/27/14 0202 05/27/14 0346 05/27/14 0529  BP: 102/62 111/84  108/69  Pulse: 87 85  77  Temp:  98 F (36.7 C)  98.3 F (36.8 C)  TempSrc:  Oral  Oral  Resp: 17 18  18   Weight:   226 lb 13.7 oz (102.9 kg)   SpO2: 92% 92%  95%   Exam: Gen: Calm in bed, not in distress. Neuro: Awake and alert, oriented x 3. HEENT: EOMI, PERRLA. Resp: Air entry equal B/L with B/L crackles  more on the right lung base. CV: S1 S2 normal, no murmur. Abd: Soft, NT/ND. Ext: no edema.  A/P: 33 Y/O F with 1. 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     Started on [place].     Blood cx obtained. We will follow and monitor for improvement.  2. UTI: Urine appears infected.     Urine culture obtained.     Continue A/B coverage.  3. Trichomonas in Urine: Recommended treatment with Flagyl.  Other chronic condition review home regimen and continue as appropriate. Also review resident's note.

## 2014-05-27 NOTE — H&P (Signed)
Family Medicine Teaching [facility] Admission History and Physical Service Pager: [phone]  Patient name: Makayla Vasquez Medical record number: [id number] Date of birth: 04/19/1981 Age: 33 y.o. Gender: female  Primary Care Provider: Saralyn Pilar, DO Consultants: none yet Code Status: full  Chief Complaint: SOB  Assessment and Plan: Makayla Vasquez is a 33 y.o. female presenting with shortness of breath and cough (intermittently productive of phlegmy sputum) for two weeks, worse in the past several days despite antibiotics as an outpt; CT shows bilateral / multifocal PNA with early cavitary RUL lesion. PMH is significant for asthma, GERD, prediabetes, and chronic systolic heart failure and congenital complete AV block s/p pacemaker since age 46.   #PNA - bilateral / multifocal with cavitary right upper lobe lesion and increased O2 requirement - uncertain etiology but certainly must be treated as an outpt failure given recent Augmentin and doxycycline - s/p Zosyn x1 and vancomycin ordered at ED, but not yet given; blood Cx drawn before abx started - continue vanc / Zosyn per pharm, with anticipated IV abx at least for 24-48 hours - anticipate extended abx course may ne necessary once transitioned to PO (14+ days) given extent of PNA - given extent of infection, HIV ordered (pending) - will f/u blood cultures; if positive, given pacer, will need to talk to cards / ID (see below)  #Incidental trichomonas in urine - not discussed with pt early this morning; no frank vaginal complaints voiced - likely add Flagyl to abx and discuss with pt (partner eval / treatment, etc)  #Asthma / seasonal allergies - current sx most likely related to PNA as above; sees Dr. Maple Hudson outpt - continue home albuterol, Flonase, Symbicort, Dymista; loratadine sub for home Zyrtec  #Prediabetes - A1c recently 6.3, on metformin at home - ordered for CBG's qAC+HS with sliding scale (sensitive since insulin  naive) - hold metformin given recent contrast load  #Cardiac - Hx HTN, chronic CHF / congenital AV block s/p pacemaker since age 64 - sees Dr. Ladona Ridgel as an outpt - BP not frankly elevated; continue home Coreg - if blood Cx positive, will need to consult ID / cardiology for recs re: pacer removal or other options  FEN/GI: heart-healthy diet, 1/2NS at 100 mL/h Prophylaxis: Lovenox  Disposition: admit to inpt med-surg bed, management as above, attending Dr. Lum Babe  History of Present Illness: Makayla Vasquez is a 33 y.o. female presenting with shortness of breath and cough (intermittently productive of phlegmy sputum) for two weeks, worse in the past several days despite antibiotics as an outpt. PMH is significant for asthma, GERD, prediabetes, and chronic systolic heart failure and congenital complete AV block s/p pacemaker since age 26. She was seen in the outpatient setting on 7/20 and diagnosed with PNA, treated with doxycycline and Augmentin (did not finish Augmentin as amoxicillin makes her itch). SHe was seen again on 8/3 and started Zyrtec and Flonase, which did not help, and then again today she was seen by Dr. Birdie Sons with plan to get a CT on 8/12. Her SOB continued and she came to the ED instead, where a CT showed a bilateral PNA with possible early cavitation on the right.  She denies fevers but has had chills, and endorses congestion, fatigue, and poor appetite with resulting weight loss in the same timeframe as above. She has had no night-sweats, blood in her sputum, chest pain, LE swelling / redness / pain. 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 She does admit she is "not a medicine-taker" and does not take her regular medicines as she should.  Review Of Systems: Per HPI. Otherwise 12 point review of systems was performed and was unremarkable.  Patient Active Problem List   Diagnosis Date Noted  . Allergic rhinitis  05/22/2014  . Vaginal itching 05/22/2014  . Pneumonia 05/09/2014  . Prediabetes 05/09/2014  . Shortness of breath 05/08/2014  . Sterilization consult 04/20/2014  . Breast tenderness 01/18/2014  . Carpal tunnel syndrome 09/10/2012  . Constipation 03/03/2012  . Chronic systolic heart failure 02/26/2012  . Chest pain, unspecified 12/29/2011  . Post-nasal drip 10/24/2011  . Contraception 01/29/2011  . Heart palpitations   . PPM-Medtronic   . Congenital complete AV block   . Bipolar affective disorder   . Obesity   . GERD (gastroesophageal reflux disease)   . Hypertension 01/05/2011  . THYROGLOSSAL DUCT CYST 07/18/2010  . Impaired glucose tolerance 05/07/2010  . UNSPECIFIED CARDIAC DYSRHYTHMIA 05/07/2010  . RH FACTOR, NEGATIVE 04/08/2010  . Mild intermittent asthma without complication 01/11/2009  . 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 HEART BLOCK 12/17/2006  . IRRITABLE BOWEL SYNDROME 12/17/2006   Past Medical History: Past Medical History  Diagnosis Date  . Heart palpitations   . Cardiac pacemaker     Since age 59. Last generator change in 2004  . Congenital complete AV block   . Obesity   . GERD (gastroesophageal reflux disease)   . Asymptomatic LV dysfunction     Echo in Dec 2011 with EF 35 to 40%. Felt to be due to paced rhythm  . Seizures     as a child- from high fever  . Anxiety   . Bipolar affective disorder   . Depression     bipolar  . Pacemaker     4-chamber  . Heart murmur     when I was little  . Carpal tunnel syndrome of right wrist   . Constipation   . Anemia     after child birth was put on Iron.  . Seasonal allergies   . CHF (congestive heart failure)     R/T 4-chamber pacemaker  . Automatic implantable cardioverter-defibrillator in situ     medtronic pacemaker   . Asthma     seasonal allergies   . Arthritis     rheumatoid arthritis- mild, no rheumatology care    Past Surgical History: Past Surgical History  Procedure Laterality Date  . Throat  surgery  1994    s/p laser treatment  . Cesarean section    . Insert / replace / remove pacemaker      2001  . Cholecystectomy    . Iud removal  11/03/2011    Procedure: INTRAUTERINE DEVICE (IUD) REMOVAL;  Surgeon: Myra C. Marice Potter, MD;  Location: [facility];  Service: Gynecology;  Laterality: N/A;  . Cyst excision  12/10/2012    THYROID  . Thyroglossal duct cyst N/A 12/10/2012    Procedure: REVISION OF THYROGLOSSAL DUCT CYST EXCISION;  Surgeon: Serena Colonel, MD;  Location: [facility] OR;  Service: ENT;  Laterality: N/A;  Revision of Thyroglossal Duct Cyst Excision  . Nasal fracture surgery      /w plate   . Carpal tunnel with cubital tunnel Right 07/26/2013    Procedure: RIGHT LIMITED OPEN CARPAL TUNNEL RELEASE ,  RIGHT CUBITAL TUNNEL RELEASE, INSITU VERSES ULNAR NERVE DECOMPRESSION AND ANTERIOR TRANSPOSITION;  Surgeon: Dominica Severin, MD;  Location: [facility]  OR;  Service: Orthopedics;  Laterality: Right;   Social History: History  Substance Use Topics  . Smoking status: Former Smoker -- 0.25 packs/day for .5 years    Types: Cigarettes    Quit date: 07/25/1996  . Smokeless tobacco: Never Used  . Alcohol Use: No   Please also refer to relevant sections of EMR.  Family History: Family History  Problem Relation Age of Onset  . Heart disease Mother   . Hypertension Mother   . Heart disease Father   . Heart disease Sister     cardiomegaly and atherosclerosis   Allergies and Medications: Allergies  Allergen Reactions  . Sertraline Hcl Hives   No current facility-administered medications on file prior to encounter.   Current Outpatient Prescriptions on File Prior to Encounter  Medication Sig Dispense Refill  . albuterol (PROVENTIL HFA;VENTOLIN HFA) 108 (90 BASE) MCG/ACT inhaler Inhale 2 puffs into the lungs every 4 (four) hours as needed for wheezing or shortness of breath.  1 each  12  . Azelastine-Fluticasone (DYMISTA) 137-50 MCG/ACT SUSP Place 1-2 puffs into the nose at bedtime.  1 Bottle  prn  .  carvedilol (COREG) 3.125 MG tablet Take 1 tablet (3.125 mg total) by mouth 2 (two) times daily with a meal.  60 tablet  11  . cetirizine (ZYRTEC) 10 MG tablet Take 1 tablet (10 mg total) by mouth daily.  30 tablet  3  . fluticasone (FLONASE) 50 MCG/ACT nasal spray Place 2 sprays into both nostrils daily.  16 g  6  . metFORMIN (GLUCOPHAGE) 500 MG tablet Take 1 tablet (500 mg total) by mouth 2 (two) times daily with a meal.  60 tablet  3  . fluconazole (DIFLUCAN) 150 MG tablet Take 1 tablet (150 mg total) by mouth once. Repeat dose on Day 3 if persistent symptoms.  2 tablet  0    Objective: BP 102/62  Pulse 87  Temp(Src) 98.8 F (37.1 C) (Oral)  Resp 17  SpO2 92% Exam: General: nontoxic-appearing adult female on [place] O2, in no respiratory distress, speaking in full sentences HEENT: [place]/AT, EOMI, PERRLA, MMM Cardiovascular: regular rate / rhythm, no frank murmur appreciated Respiratory: diffuse slightly coarse breath sounds, loudest at bilateral bases, R > L Abdomen: soft, nontender, BS+ Extremities: warm, well-perfused, no LE edema Skin: warm, dry, intact Neuro: alert / awake, no gross focal deficits, normal strength / sensation throughout  Labs and Imaging: CBC BMET   Recent Labs Lab 05/26/14 2201  WBC 5.0  HGB 14.4  HCT 43.4  PLT 256    Recent Labs Lab 05/26/14 2201  NA 141  K 3.9  CL 103  CO2 23  BUN 9  CREATININE 0.93  GLUCOSE 96  CALCIUM 9.2     Pro-BNP: 16.8  Urine pregnancy test: NEGATIVE UA: incidentally positive for Trichomonas  Micro: Urine Cx 8/7: PENDING Blood Cx's 8/7: PENDING  EKG: NSR, paced  CTA chest, 8/7 @2308  IMPRESSION:  Severe bilateral multifocal lung opacities, concerning for severe  pneumonia. There is evidence of a developing cavitary lesion in the  right upper lobe as described above.  Although lower lobe pulmonary arteries are evaluated in somewhat  limited detail due to the extensive consolidation of lung bases,  there is no  evidence of filling defect within the pulmonary arterial  system appreciated to suggest pulmonary embolus.  , MD 05/27/2014, 12:19 AM PGY-3, [facility] Family Medicine FPTS Intern pager: [phone], text pages welcome

## 2014-05-27 NOTE — Progress Notes (Signed)
INITIAL NUTRITION ASSESSMENT  DOCUMENTATION CODES Per approved criteria  -Obesity Unspecified   INTERVENTION: -Resource Breeze BID, each provides 250 kcal, 9 g protein -Encourage adequate intake of meals  NUTRITION DIAGNOSIS: Unintentional weight loss related to decreased appetite as evidenced by 8 pound weight loss in 2 weeks.   Goal: Patient will meet >/=90% of estimated nutrition needs   Monitor:  PO intake, weight, labs, I/Os  Reason for Assessment: Malnutrition screening tool   33 y.o. female  Admitting Dx: Pneumonia  ASSESSMENT: 33 y.o. female presenting with shortness of breath and cough (intermittently productive of phlegmy sputum) for two weeks, worse in the past several days despite antibiotics as an outpt; CT shows bilateral / multifocal PNA with early cavitary RUL lesion. PMH is significant for asthma, GERD, prediabetes, and chronic systolic heart failure and congenital complete AV block s/p pacemaker since age 20.  Patient reports a poor PO intake for the last 2 weeks, likely secondary to acute illness. She has lost about 8 pounds (3% weight loss) over that time. Appetite is still fair with average intake 50% of meals. She is eating because she has to. She is open to trying nutrition supplements.  Height: Ht Readings from Last 1 Encounters:  05/22/14 5\' 6"  (1.676 m)    Weight: Wt Readings from Last 1 Encounters:  05/27/14 226 lb 13.7 oz (102.9 kg)    Ideal Body Weight: 130 pounds  % Ideal Body Weight: 174%  Wt Readings from Last 10 Encounters:  05/27/14 226 lb 13.7 oz (102.9 kg)  05/26/14 226 lb (102.513 kg)  05/22/14 230 lb 4.8 oz (104.463 kg)  05/09/14 234 lb (106.142 kg)  05/08/14 234 lb (106.142 kg)  04/20/14 239 lb 11.2 oz (108.727 kg)  04/06/14 241 lb 3.2 oz (109.408 kg)  03/14/14 240 lb (108.863 kg)  02/23/14 243 lb (110.224 kg)  01/18/14 241 lb (109.317 kg)    Usual Body Weight: 243 pounds  % Usual Body Weight: 93%  BMI:  Body mass  index is 36.63 kg/(m^2). Patient is obese, class II  Estimated Nutritional Needs: Kcal: 1950-2100 kcal Protein: 100-115 g  Fluid: >2.1 L/day  Skin: Intact  Diet Order: Cardiac  EDUCATION NEEDS: -No education needs identified at this time   Intake/Output Summary (Last 24 hours) at 05/27/14 1532 Last data filed at 05/27/14 0900  Gross per 24 hour  Intake 886.67 ml  Output      0 ml  Net 886.67 ml    Last BM: PTA   Labs:   Recent Labs Lab 05/26/14 2201  NA 141  K 3.9  CL 103  CO2 23  BUN 9  CREATININE 0.93  CALCIUM 9.2  GLUCOSE 96    CBG (last 3)   Recent Labs  05/27/14 0703 05/27/14 0907 05/27/14 1108  GLUCAP 86 143* 104*    Scheduled Meds: . antiseptic oral rinse  7 mL Mouth Rinse BID  . azelastine  1 spray Each Nare QHS  . budesonide-formoterol  2 puff Inhalation BID  . carvedilol  3.125 mg Oral BID WC  . chlorhexidine  15 mL Mouth Rinse BID  . enoxaparin (LOVENOX) injection  40 mg Subcutaneous Q24H  . fluticasone  1 spray Each Nare QHS  . insulin aspart  0-5 Units Subcutaneous QHS  . insulin aspart  0-9 Units Subcutaneous TID WC  . loratadine  10 mg Oral Daily  . piperacillin-tazobactam (ZOSYN)  IV  3.375 g Intravenous 3 times per day  . vancomycin  1,000 mg  Intravenous Q8H    Continuous Infusions: . sodium chloride 100 mL/hr (05/27/14 0326)    Past Medical History  Diagnosis Date  . Heart palpitations   . Cardiac pacemaker     Since age 84. Last generator change in 2004  . Congenital complete AV block   . Obesity   . GERD (gastroesophageal reflux disease)   . Asymptomatic LV dysfunction     Echo in Dec 2011 with EF 35 to 40%. Felt to be due to paced rhythm  . Seizures     as a child- from high fever  . Anxiety   . Bipolar affective disorder   . Depression     bipolar  . Pacemaker     4-chamber  . Heart murmur     when I was little  . Carpal tunnel syndrome of right wrist   . Constipation   . Anemia     after child birth  was put on Iron.  . Seasonal allergies   . CHF (congestive heart failure)     R/T 4-chamber pacemaker  . Automatic implantable cardioverter-defibrillator in situ     medtronic pacemaker   . Asthma     seasonal allergies   . Arthritis     rheumatoid arthritis- mild, no rheumatology care     Past Surgical History  Procedure Laterality Date  . Throat surgery  1994    s/p laser treatment  . Cesarean section    . Insert / replace / remove pacemaker      2001  . Cholecystectomy    . Iud removal  11/03/2011    Procedure: INTRAUTERINE DEVICE (IUD) REMOVAL;  Surgeon: Myra C. Marice Potter, MD;  Location: [facility];  Service: Gynecology;  Laterality: N/A;  . Cyst excision  12/10/2012    THYROID  . Thyroglossal duct cyst N/A 12/10/2012    Procedure: REVISION OF THYROGLOSSAL DUCT CYST EXCISION;  Surgeon: Serena Colonel, MD;  Location: [facility] OR;  Service: ENT;  Laterality: N/A;  Revision of Thyroglossal Duct Cyst Excision  . Nasal fracture surgery      /w plate   . Carpal tunnel with cubital tunnel Right 07/26/2013    Procedure: RIGHT LIMITED OPEN CARPAL TUNNEL RELEASE ,  RIGHT CUBITAL TUNNEL RELEASE, INSITU VERSES ULNAR NERVE DECOMPRESSION AND ANTERIOR TRANSPOSITION;  Surgeon: Dominica Severin, MD;  Location: [facility] OR;  Service: Orthopedics;  Laterality: Right;    Linnell Fulling, RD, LDN Pager #: [phone] After-Hours Pager #: [phone]

## 2014-05-27 NOTE — Progress Notes (Signed)
ANTIBIOTIC CONSULT NOTE - INITIAL  Pharmacy Consult for Vancomycin/Zosyn  Indication: pneumonia  Allergies  Allergen Reactions  . Sertraline Hcl Hives    Patient Measurements: ~102 kg  Vital Signs: Temp: 98 F (36.7 C) (08/08 0202) Temp src: Oral (08/08 0202) BP: 111/84 mmHg (08/08 0202) Pulse Rate: 85 (08/08 0202)  Labs:  Recent Labs  05/26/14 2201  WBC 5.0  HGB 14.4  PLT 256  CREATININE 0.93   Medical History: Past Medical History  Diagnosis Date  . Heart palpitations   . Cardiac pacemaker     Since age 52. Last generator change in 2004  . Congenital complete AV block   . Obesity   . GERD (gastroesophageal reflux disease)   . Asymptomatic LV dysfunction     Echo in Dec 2011 with EF 35 to 40%. Felt to be due to paced rhythm  . Seizures     as a child- from high fever  . Anxiety   . Bipolar affective disorder   . Depression     bipolar  . Pacemaker     4-chamber  . Heart murmur     when I was little  . Carpal tunnel syndrome of right wrist   . Constipation   . Anemia     after child birth was put on Iron.  . Seasonal allergies   . CHF (congestive heart failure)     R/T 4-chamber pacemaker  . Automatic implantable cardioverter-defibrillator in situ     medtronic pacemaker   . Asthma     seasonal allergies   . Arthritis     rheumatoid arthritis- mild, no rheumatology care      Assessment: 33 y/o F who failed outpatient treatment for PNA now with CT scan revealing multifocal PNA with cavitary lesion. WBC wnl, renal function ok, other labs as above.   Goal of Therapy:  Vancomycin trough level 15-20 mcg/ml  Plan:  -Vancomycin 2000 mg IV x 1, then 1000 mg IV q8h -Zosyn 3.375G IV q8h to be infused over 4 hours -Trend WBC, temp, renal function  -Drug levels as indicated   Abran Duke 05/27/2014,2:46 AM

## 2014-05-28 LAB — GLUCOSE, CAPILLARY
GLUCOSE-CAPILLARY: 151 mg/dL — AB (ref 70–99)
GLUCOSE-CAPILLARY: 83 mg/dL (ref 70–99)
Glucose-Capillary: 79 mg/dL (ref 70–99)
Glucose-Capillary: 92 mg/dL (ref 70–99)

## 2014-05-28 LAB — URINE CULTURE: Special Requests: NORMAL

## 2014-05-28 LAB — VANCOMYCIN, TROUGH: VANCOMYCIN TR: 20.2 ug/mL — AB (ref 10.0–20.0)

## 2014-05-28 NOTE — Progress Notes (Signed)
ANTIBIOTIC CONSULT NOTE - FOLLOW UP  Pharmacy Consult for Vancomycin, Zosyn Indication: pneumonia  Allergies  Allergen Reactions  . Sertraline Hcl Hives    Patient Measurements: Weight: 226 lb 13.7 oz (102.9 kg)   Vital Signs: BP: 100/68 mmHg (08/09 0941) Pulse Rate: 88 (08/09 0900) Intake/Output from previous day: 08/08 0701 - 08/09 0700 In: 1240 [P.O.:240; I.V.:1000] Out: -  Intake/Output from this shift:    Labs:  Recent Labs  05/26/14 2201  WBC 5.0  HGB 14.4  PLT 256  CREATININE 0.93   The CrCl is unknown because both a height and weight (above a minimum accepted value) are required for this calculation.  Recent Labs  05/28/14 1910  VANCOTROUGH 20.2*     Microbiology: Recent Results (from the past 720 hour(s))  URINE CULTURE     Status: None   Collection Time    05/26/14  9:04 PM      Result Value Ref Range Status   Specimen Description URINE, CLEAN CATCH   Final   Special Requests Normal   Final   Culture  Setup Time     Final   Value: 05/27/2014 00:39     Performed at [facility] Count     Final   Value: 40,000 COLONIES/ML     Performed at [facility]   Culture     Final   Value: Multiple bacterial morphotypes present, none predominant. Suggest appropriate recollection if clinically indicated.     Performed at [facility]   Report Status 05/28/2014 FINAL   Final  CULTURE, BLOOD (ROUTINE X 2)     Status: None   Collection Time    05/26/14  9:51 PM      Result Value Ref Range Status   Specimen Description BLOOD RIGHT HAND   Final   Special Requests BOTTLES DRAWN AEROBIC ONLY 1CC   Final   Culture  Setup Time     Final   Value: 05/27/2014 00:40     Performed at [facility]   Culture     Final   Value:        BLOOD CULTURE RECEIVED NO GROWTH TO DATE CULTURE WILL BE HELD FOR 5 DAYS BEFORE ISSUING A FINAL NEGATIVE REPORT     Performed at [facility]   Report Status PENDING   Incomplete   CULTURE, BLOOD (ROUTINE X 2)     Status: None   Collection Time    05/26/14 10:01 PM      Result Value Ref Range Status   Specimen Description BLOOD LEFT HAND   Final   Special Requests BOTTLES DRAWN AEROBIC AND ANAEROBIC 4CC   Final   Culture  Setup Time     Final   Value: 05/27/2014 00:40     Performed at [facility]   Culture     Final   Value:        BLOOD CULTURE RECEIVED NO GROWTH TO DATE CULTURE WILL BE HELD FOR 5 DAYS BEFORE ISSUING A FINAL NEGATIVE REPORT     Performed at [facility]   Report Status PENDING   Incomplete  MRSA PCR SCREENING     Status: None   Collection Time    05/27/14  2:33 AM      Result Value Ref Range Status   MRSA by PCR NEGATIVE  NEGATIVE Final   Comment:            The GeneXpert MRSA  Assay (FDA     approved for NASAL specimens     only), is one component of a     comprehensive MRSA colonization     surveillance program. It is not     intended to diagnose MRSA     infection nor to guide or     monitor treatment for     MRSA infections.    Anti-infectives   Start     Dose/Rate Route Frequency Ordered Stop   05/27/14 1330  metroNIDAZOLE (FLAGYL) tablet 2,000 mg     2,000 mg Oral  Once 05/27/14 1321 05/27/14 1408   05/27/14 1200  vancomycin (VANCOCIN) IVPB 1000 mg/200 mL premix     1,000 mg 200 mL/hr over 60 Minutes Intravenous Every 8 hours 05/27/14 0250     05/27/14 0700  piperacillin-tazobactam (ZOSYN) IVPB 3.375 g     3.375 g 12.5 mL/hr over 240 Minutes Intravenous 3 times per day 05/27/14 0250     05/27/14 0300  vancomycin (VANCOCIN) 2,000 mg in sodium chloride 0.9 % 500 mL IVPB     2,000 mg 250 mL/hr over 120 Minutes Intravenous  Once 05/27/14 0250 05/27/14 0531   05/26/14 2345  vancomycin (VANCOCIN) IVPB 1000 mg/200 mL premix  Status:  Discontinued     1,000 mg 200 mL/hr over 60 Minutes Intravenous  Once 05/26/14 2339 05/27/14 0236   05/26/14 2345  piperacillin-tazobactam (ZOSYN) IVPB 3.375 g  Status:  Discontinued      3.375 g 12.5 mL/hr over 240 Minutes Intravenous  Once 05/26/14 2339 05/26/14 2341   05/26/14 2345  piperacillin-tazobactam (ZOSYN) IVPB 3.375 g     3.375 g 100 mL/hr over 30 Minutes Intravenous  Once 05/26/14 2341 05/27/14 0101      Assessment: 33 year old female continue on IV antibiotics for pneumonia after failing outpatient treatment. Vancomycin trough = 20.2 (therapeutic)  Noted plans for 24 to 48 additional hours of IV antibiotics  Goal of Therapy:  Vancomycin trough level 15-20 mcg/ml  Plan:  1) Continue vancomycin 1 gram iv Q 8 hours 2) Continue Zosyn 3.375 grams iv Q 8 hours - 4 hr infusion 3) Continue to follow.  Thank you. Okey Regal, PharmD [phone]  Elwin Sleight 05/28/2014,7:57 PM

## 2014-05-28 NOTE — Progress Notes (Signed)
Family Medicine Teaching Service Daily Progress Note Intern Pager: [phone]  Patient name: Makayla Vasquez Medical record number: [id number] Date of birth: [birth year] Age: 33 y.o. Gender: female  Primary Care Provider: Saralyn Pilar, DO Consultants: None Code Status: Full code  Pt Overview and Major Events to Date:  8/8: patient admitted; started vanc/zosyn  Assessment and Plan: Makayla Vasquez is a 33 y.o. female presenting with shortness of breath and cough (intermittently productive of phlegmy sputum) for two weeks, worse in the past several days despite antibiotics as an outpt; CT shows bilateral / multifocal PNA with early cavitary RUL lesion. 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   #PNA - bilateral / multifocal with cavitary right upper lobe lesion and increased O2 requirement. HIV non-reactive. Has been afebrile. WBC improved - uncertain etiology but certainly must be treated as an outpt failure given recent Augmentin and doxycycline - continue vanc / Zosyn per pharm, with anticipated IV abx at least for 24-48 hours  - anticipate extended abx course may be necessary once transitioned to PO (14+ days) given extent of PNA  - will f/u blood cultures; if positive, given pacer, will need to talk to cards / ID (see below)   #Incidental trichomonas in urine - not discussed with pt early this morning; no frank vaginal complaints voiced  - treated with flagyl. Discussed with patient that her partner needs to be treated as well  #Asthma / seasonal allergies - current sx most likely related to PNA as above; sees Dr. Maple Hudson outpt  - continue home albuterol, Flonase, Symbicort, Dymista; loratadine sub for home Zyrtec   #Prediabetes - A1c recently 6.3, on metformin at home  - Continue SSI - hold metformin given recent contrast load   #Cardiac - Hx HTN, chronic CHF / congenital AV  block s/p pacemaker since age 33  - sees Dr. Ladona Ridgel as an outpt  - BP not frankly elevated; continue home Coreg  - if blood Cx positive, will need to consult ID / cardiology for recs re: pacer removal or other options  FEN/GI: saline lock IV. Heart healthy diet PPx: lovenox  Disposition: Discharge home pending transition to oral antibiotics and wean to room air  Subjective:  Patient states she feels the same, however, dyspnea has improved. No chest pain. Some cough with sputum.  Objective: Temp:  [98.5 F (36.9 C)-99.4 F (37.4 C)] 98.5 F (36.9 C) (08/09 0557) Pulse Rate:  [85-95] 85 (08/09 0557) Resp:  [16-22] 22 (08/09 0557) BP: (83-110)/(48-61) 83/48 mmHg (08/09 0557) SpO2:  [91 %-93 %] 91 % (08/09 0557)  Physical Exam: General: Laying in bed, in no acute distress Cardiovascular: Regular rate and rhythm Respiratory: Bilateral crackles in middle and lower bilateral lung fields, no wheezing, no increased work of breathing Abdomen: Soft, non-tender Extremities: No edema  Laboratory:  Recent Labs Lab 05/26/14 2201  WBC 5.0  HGB 14.4  HCT 43.4  PLT 256    Recent Labs Lab 05/26/14 2201  NA 141  K 3.9  CL 103  CO2 23  BUN 9  CREATININE 0.93  CALCIUM 9.2  PROT 8.5*  BILITOT 0.8  ALKPHOS 70  ALT 16  AST 23  GLUCOSE 96     Imaging/Diagnostic Tests: Dg Chest 2 View  05/26/2014   CLINICAL DATA:  Shortness of breath and hypoxia.  EXAM: CHEST  2 VIEW  COMPARISON:  05/08/2014  FINDINGS: Left-sided 3  lead pacemaker remains in place. Cardiac silhouette remains mildly enlarged. Patchy opacities in the mid and lower lungs bilaterally do not appear significantly changed. No pleural effusion or pneumothorax is identified. Right upper quadrant abdominal surgical clips are noted. No acute osseous abnormality is seen.  IMPRESSION: Unchanged bilateral lung opacities consistent with pneumonia.   Electronically Signed   By: Sebastian Ache   On: 05/26/2014 14:15   Ct Angio Chest  Pe W/cm &/or Wo Cm  05/26/2014   CLINICAL DATA:  Shortness of breath, hypoxia, cough, has failed outpatient pneumonia treatment  EXAM: CT ANGIOGRAPHY CHEST WITH CONTRAST  TECHNIQUE: Multidetector CT imaging of the chest was performed using the standard protocol during bolus administration of intravenous contrast. Multiplanar CT image reconstructions and MIPs were obtained to evaluate the vascular anatomy.  CONTRAST:  OMNIPAQUE IOHEXOL 350 MG/ML SOLN  COMPARISON:  05/26/2014 radiographs, 05/08/14 radiograph  FINDINGS: Lower lobe pulmonary arteries are somewhat obscured by extensive bilateral lower lobe consolidation. There are no filling defects appreciated within the pulmonary arterial system. The thoracic aorta shows no dissection or dilatation there is a cardiac pacing device noted. There is no discrete enlarged lymph node, although lymphoid tissue in the subcarinal region and right hilum is mildly prominent. There is no significant pleural effusion. There is no significant pericardial effusion.  There are extensive bilateral pulmonary parenchymal opacities. There is relative sparing of the lung apices. Multifocal opacity is identified bilaterally, most severely in the lower lobes, but also involving the right middle lobe and the mid to inferior aspects of both upper lobes. Consolidation is most severe at the lung bases were there are dense air bronchograms. In the upper lobes and in the mid lung zones, there is a somewhat nodular component to the infiltrate, with 1 area in the medial right upper lobe measuring 17 x 9 mm showing evidence of possible cavitation. This is seen on image 30 series 12.  Images through the upper abdomen are unremarkable. There are no acute musculoskeletal findings.  Review of the MIP images confirms the above findings.  IMPRESSION: Severe bilateral multifocal lung opacities, concerning for severe pneumonia. There is evidence of a developing cavitary lesion in the right upper lobe  as described above.  Although lower lobe pulmonary arteries are evaluated in somewhat limited detail due to the extensive consolidation of lung bases, there is no evidence of filling defect within the pulmonary arterial system appreciated to suggest pulmonary embolus.   Electronically Signed   By: Esperanza Heir M.D.   On: 05/26/2014 23:30    Jacquelin Hawking, MD 05/28/2014, 7:00 AM PGY-2, [facility] Family Medicine FPTS Intern pager: [phone], text pages welcome

## 2014-05-28 NOTE — Progress Notes (Signed)
FMTS ATTENDING ADMISSION NOTE Makayla Stecher,MD I  have seen and examined this patient, reviewed their chart. I have discussed this patient with the resident. I agree with the resident's findings, assessment and care plan.   

## 2014-05-29 ENCOUNTER — Inpatient Hospital Stay (HOSPITAL_COMMUNITY): Payer: Medicare Other

## 2014-05-29 DIAGNOSIS — I5022 Chronic systolic (congestive) heart failure: Secondary | ICD-10-CM

## 2014-05-29 DIAGNOSIS — R0902 Hypoxemia: Secondary | ICD-10-CM

## 2014-05-29 DIAGNOSIS — J189 Pneumonia, unspecified organism: Secondary | ICD-10-CM

## 2014-05-29 LAB — GLUCOSE, CAPILLARY
GLUCOSE-CAPILLARY: 105 mg/dL — AB (ref 70–99)
Glucose-Capillary: 80 mg/dL (ref 70–99)
Glucose-Capillary: 133 mg/dL — ABNORMAL HIGH (ref 70–99)
Glucose-Capillary: 76 mg/dL (ref 70–99)

## 2014-05-29 LAB — PRO B NATRIURETIC PEPTIDE: Pro B Natriuretic peptide (BNP): 49.8 pg/mL (ref 0–125)

## 2014-05-29 LAB — PROCALCITONIN: Procalcitonin: <0.1 ng/mL

## 2014-05-29 MED ORDER — ENOXAPARIN SODIUM 60 MG/0.6ML ~~LOC~~ SOLN
50.0000 mg | SUBCUTANEOUS | Status: DC
  Administered 2014-05-29 – 2014-06-02 (×5): 50 mg via SUBCUTANEOUS
  Filled 2014-05-29 (×5): qty 0.6

## 2014-05-29 MED ORDER — LEVOFLOXACIN 750 MG PO TABS
750.0000 mg | ORAL_TABLET | Freq: Every day | ORAL | Status: DC
  Administered 2014-05-29 – 2014-06-02 (×5): 750 mg via ORAL
  Filled 2014-05-29 (×5): qty 1

## 2014-05-29 NOTE — Consult Note (Signed)
Name: Makayla Vasquez MRN: [id number] DOB: 12/25/1980    ADMISSION DATE:  05/26/2014 CONSULTATION DATE:  8/10  REFERRING MD :  Lum Babe PRIMARY SERVICE:  FPTS  CHIEF COMPLAINT/reason for consult: Abnormal CT scan. ? Cavitary lung lesion.   BRIEF PATIENT DESCRIPTION:  morbidly obese female w/ known h/o congenital CHB s/p PPM, chronic systolic HF (last EF 30-35% 2013), HTN, DM, and mild intermittent asthma f/b Dr Maple Hudson. Admitted on 8/8 w/ working dx of persistent PNA in spite of out-pt abx. CT of chest was obtained. This was interpreted as multi-focal lung opacities w/ concern for evolving RUL cavitary process. She was treated w// broad spectrum abx and supportive care. To date all cultures are negative. Her BNP was only 16.8 on admit. PCCM asked to see as she has not had significant improvement in her symptom burden as well as to assist w/ interpretation of her abnormal CT scan.    SIGNIFICANT EVENTS / STUDIES:  CT chest 8/7: Severe bilateral multifocal lung opacities, ? developing cavitary lesion in the right upper lobe as described above. Echo 8/10>>>  CULTURES: BCX2 8/7>>> UC 8/7: neg   ANTIBIOTICS: vanc 8/7>>> Zosyn 8/7>>>8/10 levaquin 8/10>>>  HISTORY OF PRESENT ILLNESS:   This is a morbidly obese female w/ known h/o congenital CHB s/p PPM, chronic systolic HF (last EF 30-35% 2013), HTN, DM, and mild intermittent asthma f/b Dr Maple Hudson. Admitted on 8/8 w/ CC: 2 week h/o SOB, cough intermittently productive of white sputum. Initially seen 7/20 w/ cc: sinus congestion and cough treated w/ course of abx, plus flonase and zyrtec for sinus component.  Seen in f/u on 8/3 w/ some improvement in symptoms. Of note did have desaturation to 86% w/ activity. She reported again to her PCP on 8/7 this time stating her symptoms had not improved and felt her DOE had worsened. She denied fever,chills or CP. Was admitted for further eval. CT of chest was obtained. This was interpreted as multi-focal lung  opacities w/ concern for evolving RUL cavitary process. She was treated w// broad spectrum abx and supportive care. To date all cultures are negative. Her BNP was only 16.8 on admit. She DENIES any significant improvement PAST MEDICAL HISTORY :  Past Medical History  Diagnosis Date  . Heart palpitations   . Cardiac pacemaker     Since age 18. Last generator change in 2004  . Congenital complete AV block   . Obesity   . GERD (gastroesophageal reflux disease)   . Asymptomatic LV dysfunction     Echo in Dec 2011 with EF 35 to 40%. Felt to be due to paced rhythm  . Seizures     as a child- from high fever  . Anxiety   . Bipolar affective disorder   . Depression     bipolar  . Pacemaker     4-chamber  . Heart murmur     when I was little  . Carpal tunnel syndrome of right wrist   . Constipation   . Anemia     after child birth was put on Iron.  . Seasonal allergies   . CHF (congestive heart failure)     R/T 4-chamber pacemaker  . Automatic implantable cardioverter-defibrillator in situ     medtronic pacemaker   . Asthma     seasonal allergies   . Arthritis     rheumatoid arthritis- mild, no rheumatology care    Past Surgical History  Procedure Laterality Date  . Throat surgery  1994  s/p laser treatment  . Cesarean section    . Insert / replace / remove pacemaker      2001  . Cholecystectomy    . Iud removal  11/03/2011    Procedure: INTRAUTERINE DEVICE (IUD) REMOVAL;  Surgeon: Myra C. Marice Potter, MD;  Location: [facility] ORS;  Service: Gynecology;  Laterality: N/A;  . Cyst excision  12/10/2012    THYROID  . Thyroglossal duct cyst N/A 12/10/2012    Procedure: REVISION OF THYROGLOSSAL DUCT CYST EXCISION;  Surgeon: Serena Colonel, MD;  Location: [facility] OR;  Service: ENT;  Laterality: N/A;  Revision of Thyroglossal Duct Cyst Excision  . Nasal fracture surgery      /w plate   . Carpal tunnel with cubital tunnel Right 07/26/2013    Procedure: RIGHT LIMITED OPEN CARPAL TUNNEL RELEASE ,  RIGHT  CUBITAL TUNNEL RELEASE, INSITU VERSES ULNAR NERVE DECOMPRESSION AND ANTERIOR TRANSPOSITION;  Surgeon: Dominica Severin, MD;  Location: [facility] OR;  Service: Orthopedics;  Laterality: Right;   Prior to Admission medications   Medication Sig Start Date End Date Taking? Authorizing Provider  albuterol (PROVENTIL HFA;VENTOLIN HFA) 108 (90 BASE) MCG/ACT inhaler Inhale 2 puffs into the lungs every 4 (four) hours as needed for wheezing or shortness of breath. 02/23/14  Yes Waymon Budge, MD  Azelastine-Fluticasone (DYMISTA) 137-50 MCG/ACT SUSP Place 1-2 puffs into the nose at bedtime. 04/06/14 04/06/15 Yes Waymon Budge, MD  budesonide-formoterol (SYMBICORT) 80-4.5 MCG/ACT inhaler Inhale 2 puffs into the lungs 2 (two) times daily. 2 puffs then rinse mouth, twice daily 04/06/14  Yes Waymon Budge, MD  carvedilol (COREG) 3.125 MG tablet Take 1 tablet (3.125 mg total) by mouth 2 (two) times daily with a meal. 01/18/14  Yes Charm Rings, MD  cetirizine (ZYRTEC) 10 MG tablet Take 1 tablet (10 mg total) by mouth daily. 05/22/14  Yes Alexander Althea Charon, DO  fluticasone (FLONASE) 50 MCG/ACT nasal spray Place 2 sprays into both nostrils daily. 12/01/13  Yes Charm Rings, MD  metFORMIN (GLUCOPHAGE) 500 MG tablet Take 1 tablet (500 mg total) by mouth 2 (two) times daily with a meal. 01/18/14  Yes Charm Rings, MD  fluconazole (DIFLUCAN) 150 MG tablet Take 1 tablet (150 mg total) by mouth once. Repeat dose on Day 3 if persistent symptoms. 05/22/14   Saralyn Pilar, DO   Allergies  Allergen Reactions  . Sertraline Hcl Hives    FAMILY HISTORY:  Family History  Problem Relation Age of Onset  . Heart disease Mother   . Hypertension Mother   . Heart disease Father   . Heart disease Sister     cardiomegaly and atherosclerosis   SOCIAL HISTORY:  reports that she quit smoking about 17 years ago. Her smoking use included Cigarettes. She has a .125 pack-year smoking history. She has never used smokeless tobacco. She  reports that she does not drink alcohol or use illicit drugs.  REVIEW OF SYSTEMS:   Constitutional: Negative for fever, chills, weight loss, weight gain last 6 mo.15 lbs. malaise/fatigue and diaphoresis.  HENT: Negative for hearing loss, ear pain, nosebleeds, congestion, sore throat, neck pain, tinnitus and ear discharge.  Eyes: Negative for blurred vision, double vision, photophobia, pain, discharge and redness.  Respiratory: Negative for cough, hemoptysis, sputum production white sputum only, shortness of breath,feels better yet has not exerted self wheezing and stridor.   Cardiovascular: Negative for chest pain, w/ cough palpitations, orthopnea, claudication, leg swelling and PND.  Gastrointestinal: Negative for heartburn, nausea, vomiting, abdominal pain, diarrhea,  constipation, blood in stool and melena.  Genitourinary: Negative for dysuria, urgency, frequency, hematuria and flank pain.  Musculoskeletal: Negative for myalgias, back pain, joint pain and falls.  Skin: Negative for itching and rash.  Neurological: Negative for dizziness, tingling, tremors, sensory change, speech change, focal weakness, seizures, loss of consciousness, weakness and headaches.  Endo/Heme/Allergies: Negative for environmental allergies and polydipsia. Does not bruise/bleed easily.  SUBJECTIVE:  No sig improvement  VITAL SIGNS: Temp:  [98.2 F (36.8 C)-99.9 F (37.7 C)] 98.2 F (36.8 C) (08/10 0617) Pulse Rate:  [67-97] 97 (08/10 0617) Resp:  [18-20] 20 (08/10 0617) BP: (101-120)/(52-89) 120/89 mmHg (08/10 0617) SpO2:  [90 %-95 %] 92 % (08/10 1139)  PHYSICAL EXAMINATION: General:  Obese AAf, no acute distress.  Neuro:  Awake, oriented, no focal def  HEENT:  Animas, no JVD phonation hoarse  Cardiovascular:  rrr Lungs:  Basilar rales posteriorly.  Abdomen:  Soft, non-tender  Musculoskeletal:  Intact  Skin:  Intact  CBC Recent Labs     05/26/14  2201  WBC  5.0  HGB  14.4  HCT  43.4  PLT  256     Coag's No results found for this basename: APTT, INR,  in the last 72 hours  BMET Recent Labs     05/26/14  2201  NA  141  K  3.9  CL  103  CO2  23  BUN  9  CREATININE  0.93  GLUCOSE  96    Electrolytes Recent Labs     05/26/14  2201  CALCIUM  9.2    Sepsis Markers No results found for this basename: LACTICACIDVEN, PROCALCITON, O2SATVEN,  in the last 72 hours  ABG No results found for this basename: PHART, PCO2ART, PO2ART,  in the last 72 hours  Liver Enzymes Recent Labs     05/26/14  2201  AST  23  ALT  16  ALKPHOS  70  BILITOT  0.8  ALBUMIN  3.6    Cardiac Enzymes Recent Labs     05/26/14  2201  PROBNP  16.8    Glucose Recent Labs     05/28/14  0636  05/28/14  1144  05/28/14  1619  05/28/14  2106  05/29/14  0624  05/29/14  1112  GLUCAP  151*  79  92  83  80  133*    Imaging No results found.   ASSESSMENT / PLAN:  Dyspnea in setting of Diffuse bilateral Pulmonary infiltrates.  Probable sinusitis Intermittent asthma Systolic HF (EF 30-35%) Probable OSA  Discussion Her presentation could be explained by PNA but she has not had sig improvement in her symptoms, plus would think she should look much sicker with this CXR. Not convinced that the finding on CT scan actually represent an infectious process. Also not convinced that the  RUL changes are a evolving cavitation. Would add pulmonary edema or viral pneumonitis to the list of potential etiology of her pulmonary infiltrates.   Recommendations Repeat CXR now Ck Procalcitonin Get resp viral panel Cont current abx  Ck/repeat BNP Ck ECHO Consider diuresis if PCXR still w/ sig infiltrates.  Do not think she needs FOB. She gets annual PPDs all neg.  When she sees [place] again needs OSA eval.. ? PSG  Cont her LABA/ICS combo Cont nasal hygiene   Pulmonary and Critical Care Medicine [facility] Pager: [phone]  05/29/2014, 2:03 PM  2D echo ordered, CXR ordered, continue  abx for now, respiratory viral panel as ordered. Continue  abx with atypical coverage and monitor clinically.  Patient seen and examined, agree with above note.  I dictated the care and orders written for this patient under my direction.  Alyson Reedy, MD [phone]  05/29/2014, 2:03 PM

## 2014-05-29 NOTE — Progress Notes (Signed)
Family Medicine Teaching Service Daily Progress Note Intern Pager: [phone]  Patient name: Makayla Vasquez Medical record number: [id number] Date of birth: 1981-05-20 Age: 33 y.o. Gender: female  Primary Care Provider: Saralyn Pilar, DO Consultants: None Code Status: Full code  Pt Overview and Major Events to Date:  8/8: patient admitted; started vanc/zosyn 8/9: PNA treatment with vanc/zosyn and O2 [place] 8/10: PNA treatment with vanc/zosyn and O2 [place]   Assessment and Plan: Makayla Vasquez is a 33 y.o. Female with a PMH significant for asthma,prediabetes, and chronic systolic heart failure and congenital complete AV block s/p pacemaker since age 63 who p/w shortness of breath and cough subsequently found to have bilateral multifocal PNA with early cavitary RUL lesion.   #PNA - Symptoms improving. CTA (8/7) showed bilateral, multifocal infiltrates with cavitary right upper lobe lesion. On admission, pt's O2 reqs were 4L (none at home), improved to 2L-3L today. HIV non-reactive. Recent PPD negative. Pt denies N/V/F. Of note, pt failed outpt tx w/ Augmentin and doxycycline. Unclear etiology to date, most likely 2/2 S.aureus, but also consider klebsiella. BCx show no growth at 48hrs.  - Pulmonology consulted, will see pt today  - W/ pt's cardiac hx, continue vanc/Zosyn until BCx show no growth x 72hrs.  - For Abx transition, d'c vanc and zosyn and start levofloxacin for a total of 10 days - continue to f/u blood cultures; if positive, given pacer, will need to talk to cards / ID (see below) - Pt's O2 reqs increased from 2L to 3L throughout the day, continue to assess and wean   #Incidental trichomonas in urine - not discussed with pt early this morning; no frank vaginal complaints voiced  - treated with flagyl. Discussed with patient that her partner needs to be treated as well  #Asthma / seasonal allergies - current sx most likely related to PNA as above; sees Dr. Maple Hudson outpt  - continue  home albuterol, Flonase, Symbicort, Dymista; loratadine sub for home Zyrtec   #Prediabetes - A1c recently 6.3, on metformin at home. Pt's BG wnl. - Continue SSI - hold metformin given recent contrast load   #Cardiac - Hx HTN, chronic CHF / congenital AV block s/p pacemaker since age 28  - sees Dr. Ladona Ridgel as an outpt  - BP not frankly elevated; continue home Coreg  - if blood Cx positive, will need to consult ID / cardiology for recs re: pacer removal or other options  FEN/GI: saline lock IV. Heart healthy diet PPx: lovenox  Disposition: Discharge home pending transition to oral antibiotics and wean to room air  Subjective:  Patient states she feels better, dyspnea has improved. She continues to deny chest pain. Reports productive cough with some blood-tinged sputum.  Objective: Temp:  [98.2 F (36.8 C)-99.9 F (37.7 C)] 98.2 F (36.8 C) (08/10 0617) Pulse Rate:  [67-97] 97 (08/10 0617) Resp:  [18-20] 20 (08/10 0617) BP: (100-120)/(52-89) 120/89 mmHg (08/10 0617) SpO2:  [91 %-95 %] 95 % (08/10 0617)  Physical Exam: General: Laying in bed, in no acute distress Cardiovascular: Regular rate and rhythm Respiratory: shallow breathing, mildly labored. Unable to fully assess crackles. No wheezing.  Abdomen: Soft, non-tender Extremities: No edema  Laboratory:  Recent Labs Lab 05/26/14 2201  WBC 5.0  HGB 14.4  HCT 43.4  PLT 256    Recent Labs Lab 05/26/14 2201  NA 141  K 3.9  CL 103  CO2 23  BUN 9  CREATININE 0.93  CALCIUM 9.2  PROT 8.5*  BILITOT 0.8  ALKPHOS 70  ALT 16  AST 23  GLUCOSE 96    Imaging/Diagnostic Tests: No results found.  Clenton Pare, Med Student 05/29/2014, 8:33 AM  FPTS Upper-Level Resident Addendum  I have independently interviewed and examined the patient. I have discussed the above with Makayla Vasquez, MS4 and agree with her documentation as above. The above reflects her original note. Below is my independent exam and assessment and  plan.  Exam: BP 120/89  Pulse 97  Temp(Src) 98.2 F (36.8 C) (Oral)  Resp 20  Wt 226 lb 13.7 oz (102.9 kg)  SpO2 92% General: adult female lying in bed, easily changes positions for exam Cardio: RRR, no murmur appreciated Pulm: generally clear though with some coarse sounds diffusely; poor effort throughout with shallow breathing  Abdomen: Soft, non-tender, BS+ Extremities: moves all ext equally, no LE edema  A/P:  #PNA - improving slowly, but with cavitary upper lobe lesion and severity of illness, prefer to consult pulm - continue vanc/Zosyn until blood cultures neg x72 given pacer in place - wean to room air as tolerated  #Cardiac hx - HTN, chronic CHF with congenital complete AV block s/p pacer (in place 16+ years) - BP not frankly elevated, continue home Coreg - monitoring blood cultures, as above; if positive, need to consult ID and cards  #Asthma / allergies - continue home albuterol, Symbicort, Dymista, Flonase, and antihistamine - pulm to see, as above  #Prediabetes - continue SSI, CBG's not greatly elevated; holding metformin  #Trich in urine -treated with Flagyl; pt instructed to have partner tested / treated  Bobbye Morton, MD PGY-3, [facility] Family Medicine FPTS Service pager: [phone] (text pages welcome through [employer])

## 2014-05-29 NOTE — Progress Notes (Signed)
Seen and examined.  Discussed with Dr. Casper Harrison.  Agree with his management and documentation.  I am worried about duration of illness and cavitary lesions (R/O TB).  Will ask pulm to see.

## 2014-05-29 NOTE — Progress Notes (Signed)
Pts oxygen sat sitting on the edge of the bed is 91% on 2L. Pts O2 sat dropped to 80-81% on 2 liters with ambulation. Did not attempt to ambulate Pt without oxygen at this time due to drop in sats. Pt stated she did not feel short of breath or fatigued after ambulation. O2 sat increased to 90% once Pt returned to the room and sat in bed. Will attempt to ambulate without O2 at a later time.

## 2014-05-30 DIAGNOSIS — I517 Cardiomegaly: Secondary | ICD-10-CM

## 2014-05-30 LAB — GLUCOSE, CAPILLARY
GLUCOSE-CAPILLARY: 119 mg/dL — AB (ref 70–99)
Glucose-Capillary: 112 mg/dL — ABNORMAL HIGH (ref 70–99)
Glucose-Capillary: 99 mg/dL (ref 70–99)
Glucose-Capillary: 126 mg/dL — ABNORMAL HIGH (ref 70–99)

## 2014-05-30 LAB — SEDIMENTATION RATE: Sed Rate: 62 mm/hr — ABNORMAL HIGH (ref 0–22)

## 2014-05-30 MED ORDER — VANCOMYCIN HCL 10 G IV SOLR
2000.0000 mg | INTRAVENOUS | Status: AC
  Administered 2014-05-30: 2000 mg via INTRAVENOUS
  Filled 2014-05-30: qty 2000

## 2014-05-30 MED ORDER — VANCOMYCIN HCL 10 G IV SOLR
1250.0000 mg | Freq: Two times a day (BID) | INTRAVENOUS | Status: DC
  Administered 2014-05-31: 1250 mg via INTRAVENOUS
  Filled 2014-05-30 (×2): qty 1250

## 2014-05-30 MED ORDER — FUROSEMIDE 10 MG/ML IJ SOLN
40.0000 mg | Freq: Once | INTRAMUSCULAR | Status: AC
  Administered 2014-05-30: 40 mg via INTRAVENOUS
  Filled 2014-05-30: qty 4

## 2014-05-30 MED ORDER — POTASSIUM CHLORIDE CRYS ER 20 MEQ PO TBCR
20.0000 meq | EXTENDED_RELEASE_TABLET | Freq: Once | ORAL | Status: AC
  Administered 2014-05-30: 20 meq via ORAL
  Filled 2014-05-30: qty 1

## 2014-05-30 NOTE — Progress Notes (Signed)
Family Medicine Teaching Service Daily Progress Note Intern Pager: [phone]  Patient name: Makayla Vasquez Medical record number: [id number] Date of birth: [birth year] Age: 33 y.o. Gender: female  Primary Care Provider: Nobie Putnam, DO Consultants: None Code Status: Full code  Pt Overview and Major Events to Date:  8/8: patient admitted; started vanc/zosyn 8/9: PNA treatment with vanc/zosyn and O2 [place] 8/10: PNA treatment with vanc/zosyn and O2 [place]   Assessment and Plan: Makayla Vasquez is a 33 y.o. Female with a PMH significant for asthma,prediabetes, and chronic systolic heart failure and congenital complete AV block s/p pacemaker since age 64 who p/w shortness of breath and cough subsequently found to have bilateral multifocal PNA with early cavitary RUL lesion.   #PNA - Symptoms improving. CTA (8/7) showed bilateral, multifocal infiltrates with cavitary right upper lobe lesion. On admission, pt's O2 reqs were 4L (none at home), improved to 2L. HIV non-reactive. Recent PPD negative. Pt denies N/V/F. Of note, pt failed outpt tx w/ Augmentin and doxycycline. Unclear etiology to date, DDx includes pulmonary edema, PNA, viral pneumonitis. BCx shows no growth.  - Pulmonology consulted; suggested echo, CXR, and respiratory viral panel, procalcitonin, ESR, Cr -->will f/u on test - Discontinued vanc/Zosyn and switched to PO Levaquin --BCx show no growth x 72hrs and procalcitonin <.1 - start levofloxacin for a total of 10 days - continue to f/u blood cultures; if positive, given pacer, will need to talk to cards / ID (see below) - Pt's O2 reqs still not improved; pt failed weaning yeaterday, continue to assess and wean as tolerated  -respiratory viral panel pending -consider adding back vanc if patient worsens -Put order in to ambulate patient since she has not been getting out of bed.  #Incidental trichomonas in urine - no frank vaginal complaints voiced  - s/p flagyl. Discussed with  patient that her partner needs to be treated as well  #Asthma / seasonal allergies - current sx most likely related to PNA as above; sees Dr. Annamaria Boots outpt  - continue home albuterol, Flonase, Symbicort, Dymista; loratadine sub for home Zyrtec   #Prediabetes - A1c recently 6.3, on metformin at home. Pt's BG wnl. - Continue SSI - hold metformin given recent contrast load   #Cardiac - Hx HTN, chronic CHF / congenital AV block s/p pacemaker since age 65  - sees Dr. Lovena Le as an outpt  - BP not frankly elevated; continue home Coreg  - if blood Cx positive, will need to consult ID / cardiology for recs re: pacer removal or other options  FEN/GI: saline lock IV. Heart healthy diet PPx: lovenox  Disposition: Continue current management plan. Patient not ready for discharge currently.   Subjective:  Patient states she feels better, dyspnea has improved. She denies any pain. No concerns/complaints voiced at this time.  Objective: Temp:  [98.2 F (36.8 C)-98.3 F (36.8 C)] 98.2 F (36.8 C) (08/11 0448) Pulse Rate:  [97-108] 97 (08/11 0448) Resp:  [16-20] 16 (08/11 0448) BP: (106-122)/(50-89) 122/62 mmHg (08/11 0448) SpO2:  [88 %-95 %] 93 % (08/11 0448)  Physical Exam: General: Laying in bed, in no acute distress Cardiovascular: Regular rate and rhythm Respiratory: shallow breathing, mildly labored. No wheezing or crackles. Abdomen: Soft, non-tender, +BS Extremities: No edema  Laboratory:  Recent Labs Lab 05/26/14 2201  WBC 5.0  HGB 14.4  HCT 43.4  PLT 256    Recent Labs Lab 05/26/14 2201  NA 141  K 3.9  CL 103  CO2 23  BUN  9  CREATININE 0.93  CALCIUM 9.2  PROT 8.5*  BILITOT 0.8  ALKPHOS 70  ALT 16  AST 23  GLUCOSE 96   BNP- 49.8 (16.8 admission) Procalcitonin - <.10 RVP pending  Imaging/Diagnostic Tests: Dg Chest Port 1 View  05/29/2014   CLINICAL DATA:  Congestion  EXAM: PORTABLE CHEST - 1 VIEW  COMPARISON:  May 26, 2014 chest radiograph and chest CT   FINDINGS: There is increase consolidation in both lung bases. There is more subtle patchy infiltrate in both upper lobes, better seen on CT. Heart is enlarged with pulmonary vascularity normal. No adenopathy. Pacemaker leads are attached to the left and right ventricle.  IMPRESSION: Increase in bibasilar consolidation. More subtle patchy infiltrate in the upper lobes is better seen on recent CT. Stable cardiomegaly.   Electronically Signed   By: Lowella Grip M.D.   On: 05/29/2014 15:30   Echo pending   Katheren Shams, DO PGY-1, [place] Service pager: [phone] (text pages welcome through [facility])

## 2014-05-30 NOTE — Progress Notes (Signed)
Ambulated patient twice today.  After lunch, o2 sats upon standing was 89% on 3 L.  Pt reported no shortness of breath, after walking about 100 feet, 02 sats was 92% on 3L.  After dinner, 02 sats were 92% on 3L upon standing.  After walking about 200 feet, 02 sats were 88% on 3L.  Again, pt reported no shortness of breath.  Nsg to continue to monitor.

## 2014-05-30 NOTE — Progress Notes (Signed)
[facility] PCP Visit - Progress Note  I have seen patient and discussed current hospitalization with her and her family at bedside. Cadence reports overall feeling better, no longer endorsing shortness of breath at rest, currently still wearing supplemental O2. She understands the current treatment course for PNA.   I agree with the current hospital plan outlined by the primary team of [facility] Family Medicine Teaching Service and want to thank them for their continued excellent care and efforts in caring for my patient. I will anticipate to follow-up with the patient in the clinic after discharge from hospital.  Saralyn Pilar, DO [facility], PGY-2

## 2014-05-30 NOTE — Progress Notes (Signed)
ANTIBIOTIC CONSULT NOTE - FOLLOW UP  Pharmacy Consult for Vancomycin Indication: pneumonia  Allergies  Allergen Reactions  . Sertraline Hcl Hives    Patient Measurements: Height: 5\' 6"  (167.6 cm) Weight: 226 lb 13.7 oz (102.9 kg) IBW/kg (Calculated) : 59.3   Vital Signs: Temp: 101.4 F (38.6 C) (08/11 1250) Temp src: Oral (08/11 1250) BP: 101/60 mmHg (08/11 1250) Pulse Rate: 98 (08/11 1250) Intake/Output from previous day: 08/10 0701 - 08/11 0700 In: 240 [P.O.:240] Out: -  Intake/Output from this shift: Total I/O In: 480 [P.O.:480] Out: 2 [Urine:2]  Labs: No results found for this basename: WBC, HGB, PLT, LABCREA, CREATININE,  in the last 72 hours Estimated Creatinine Clearance: 103.2 ml/min (by C-G formula based on Cr of 0.93).  Recent Labs  05/28/14 1910  VANCOTROUGH 20.2*     Microbiology: Recent Results (from the past 720 hour(s))  URINE CULTURE     Status: None   Collection Time    05/26/14  9:04 PM      Result Value Ref Range Status   Specimen Description URINE, CLEAN CATCH   Final   Special Requests Normal   Final   Culture  Setup Time     Final   Value: 05/27/2014 00:39     Performed at 07/27/2014 Count     Final   Value: 40,000 COLONIES/ML     Performed at [facility]   Culture     Final   Value: Multiple bacterial morphotypes present, none predominant. Suggest appropriate recollection if clinically indicated.     Performed at [facility]   Report Status 05/28/2014 FINAL   Final  CULTURE, BLOOD (ROUTINE X 2)     Status: None   Collection Time    05/26/14  9:51 PM      Result Value Ref Range Status   Specimen Description BLOOD RIGHT HAND   Final   Special Requests BOTTLES DRAWN AEROBIC ONLY 1CC   Final   Culture  Setup Time     Final   Value: 05/27/2014 00:40     Performed at 07/27/2014   Culture     Final   Value:        BLOOD CULTURE RECEIVED NO GROWTH TO DATE CULTURE WILL BE HELD FOR 5 DAYS  BEFORE ISSUING A FINAL NEGATIVE REPORT     Performed at [facility]   Report Status PENDING   Incomplete  CULTURE, BLOOD (ROUTINE X 2)     Status: None   Collection Time    05/26/14 10:01 PM      Result Value Ref Range Status   Specimen Description BLOOD LEFT HAND   Final   Special Requests BOTTLES DRAWN AEROBIC AND ANAEROBIC 4CC   Final   Culture  Setup Time     Final   Value: 05/27/2014 00:40     Performed at 07/27/2014   Culture     Final   Value:        BLOOD CULTURE RECEIVED NO GROWTH TO DATE CULTURE WILL BE HELD FOR 5 DAYS BEFORE ISSUING A FINAL NEGATIVE REPORT     Performed at [facility]   Report Status PENDING   Incomplete  MRSA PCR SCREENING     Status: None   Collection Time    05/27/14  2:33 AM      Result Value Ref Range Status   MRSA by PCR NEGATIVE  NEGATIVE Final  Comment:            The GeneXpert MRSA Assay (FDA     approved for NASAL specimens     only), is one component of a     comprehensive MRSA colonization     surveillance program. It is not     intended to diagnose MRSA     infection nor to guide or     monitor treatment for     MRSA infections.    Anti-infectives   Start     Dose/Rate Route Frequency Ordered Stop   05/29/14 1230  levofloxacin (LEVAQUIN) tablet 750 mg     750 mg Oral Daily 05/29/14 1216     05/27/14 1330  metroNIDAZOLE (FLAGYL) tablet 2,000 mg     2,000 mg Oral  Once 05/27/14 1321 05/27/14 1408   05/27/14 1200  vancomycin (VANCOCIN) IVPB 1000 mg/200 mL premix  Status:  Discontinued     1,000 mg 200 mL/hr over 60 Minutes Intravenous Every 8 hours 05/27/14 0250 05/30/14 0904   05/27/14 0700  piperacillin-tazobactam (ZOSYN) IVPB 3.375 g  Status:  Discontinued     3.375 g 12.5 mL/hr over 240 Minutes Intravenous 3 times per day 05/27/14 0250 05/29/14 1216   05/27/14 0300  vancomycin (VANCOCIN) 2,000 mg in sodium chloride 0.9 % 500 mL IVPB     2,000 mg 250 mL/hr over 120 Minutes Intravenous  Once 05/27/14  0250 05/27/14 0531   05/26/14 2345  vancomycin (VANCOCIN) IVPB 1000 mg/200 mL premix  Status:  Discontinued     1,000 mg 200 mL/hr over 60 Minutes Intravenous  Once 05/26/14 2339 05/27/14 0236   05/26/14 2345  piperacillin-tazobactam (ZOSYN) IVPB 3.375 g  Status:  Discontinued     3.375 g 12.5 mL/hr over 240 Minutes Intravenous  Once 05/26/14 2339 05/26/14 2341   05/26/14 2345  piperacillin-tazobactam (ZOSYN) IVPB 3.375 g     3.375 g 100 mL/hr over 30 Minutes Intravenous  Once 05/26/14 2341 05/27/14 0101      Assessment: 33 year old female continues on IV antibiotics for pneumonia after failing outpatient treatment. Total Abx Day#4 (narrowed to Levaquin yesterday) for HCAP + cavitary lesion. Restarting Vancomycin today - last dose of 1gm given yesterday at 0400. Hx (-)TB tests. Tm 101.4. Pt doesn't appear to be improving much on abx.   Vancomycin trough 20.2 (slightly supratherapeutic) on 1gm IV q8h.  Vanc 8/8>>8/10; restart 8/11 Zosyn 8/8>>8/10 levaquin 8/10>>  BCx2 8/7>>ngtd UC 8/7>>neg MRSA 8/8 > neg  Goal of Therapy:  Vancomycin trough level 15-20 mcg/ml  Plan:  1) Reload Vancomycin 2gm IV now then Vancomycin 1250mg  IV q12h 2) Will f/u renal function, micro data, clinical condition and trough prn  , PharmD, BCPS Clinical pharmacist, pager [phone] 05/30/2014,1:29 PM

## 2014-05-30 NOTE — Progress Notes (Signed)
Echocardiogram 2D Echocardiogram has been performed.  Dorothey Baseman 05/30/2014, 11:14 AM

## 2014-05-30 NOTE — Progress Notes (Signed)
 Name: Makayla Vasquez MRN: [id number] DOB: 08/16/1981    ADMISSION DATE:  05/26/2014 CONSULTATION DATE:  8/10  REFERRING MD :  Eniola PRIMARY SERVICE:  FPTS  CHIEF COMPLAINT/reason for consult: Abnormal CT scan. ? Cavitary lung lesion.   BRIEF PATIENT DESCRIPTION:  morbidly obese female w/ known h/o congenital CHB s/p PPM, chronic systolic HF (last EF 30-35% 2013), HTN, DM, and mild intermittent asthma f/b Dr Young. Admitted on 8/8 w/ working dx of persistent PNA in spite of out-pt abx. CT of chest was obtained. This was interpreted as multi-focal lung opacities w/ concern for evolving RUL cavitary process. She was treated w// broad spectrum abx and supportive care. To date all cultures are negative. Her BNP was only 16.8 on admit. PCCM asked to see as she has not had significant improvement in her symptom burden as well as to assist w/ interpretation of her abnormal CT scan.    SIGNIFICANT EVENTS / STUDIES:  CT chest 8/7: Severe bilateral multifocal lung opacities, ? developing cavitary lesion in the right upper lobe as described above. Echo 8/10>>>  CULTURES: BCX2 8/7>>> UC 8/7: neg   ANTIBIOTICS: vanc 8/7>>> Zosyn 8/7>>>8/10 levaquin 8/10>>>   SUBJECTIVE:  No sig improvement  VITAL SIGNS: Temp:  [98.2 F (36.8 C)-98.3 F (36.8 C)] 98.2 F (36.8 C) (08/11 0448) Pulse Rate:  [97-108] 97 (08/11 0448) Resp:  [16] 16 (08/11 0448) BP: (106-122)/(50-62) 122/62 mmHg (08/11 0448) SpO2:  [88 %-94 %] 89 % (08/11 0831) 6 liters  PHYSICAL EXAMINATION: General:  Obese AAf, no acute distress.  Neuro:  Awake, oriented, no focal def  HEENT:  [place], no JVD phonation hoarse  Cardiovascular:  rrr Lungs:  Basilar rales posteriorly, no accessory muscle use  Abdomen:  Soft, non-tender  Musculoskeletal:  Intact  Skin:  Intact  CBC No results found for this basename: WBC, HGB, HCT, PLT,  in the last 72 hours  Coag's No results found for this basename: APTT, INR,  in the last 72  hours  BMET No results found for this basename: NA, K, CL, CO2, BUN, CREATININE, GLUCOSE,  in the last 72 hours  Electrolytes No results found for this basename: CALCIUM, MG, PHOS,  in the last 72 hours  Sepsis Markers Recent Labs     05/29/14  1505  PROCALCITON  <0.10    ABG No results found for this basename: PHART, PCO2ART, PO2ART,  in the last 72 hours  Liver Enzymes No results found for this basename: AST, ALT, ALKPHOS, BILITOT, ALBUMIN,  in the last 72 hours  Cardiac Enzymes Recent Labs     05/29/14  1530  PROBNP  49.8    Glucose Recent Labs     05/28/14  2106  05/29/14  0624  05/29/14  1112  05/29/14  1646  05/29/14  2130  05/30/14  0622  GLUCAP  83  80  133*  76  105*  112*    Imaging Dg Chest Port 1 View  05/29/2014   CLINICAL DATA:  Congestion  EXAM: PORTABLE CHEST - 1 VIEW  COMPARISON:  May 26, 2014 chest radiograph and chest CT  FINDINGS: There is increase consolidation in both lung bases. There is more subtle patchy infiltrate in both upper lobes, better seen on CT. Heart is enlarged with pulmonary vascularity normal. No adenopathy. Pacemaker leads are attached to the left and right ventricle.  IMPRESSION: Increase in bibasilar consolidation. More subtle patchy infiltrate in the upper lobes is better seen on recent CT. Stable   cardiomegaly.   Electronically Signed   By: William  Woodruff M.D.   On: 05/29/2014 15:30     ASSESSMENT / PLAN:  Dyspnea in setting of Diffuse bilateral Pulmonary infiltrates.  Probable sinusitis Intermittent asthma Basilar atelectasis  Systolic HF (EF 30-35%) Probable OSA  Discussion Her presentation could be explained by PNA but she has not had sig improvement in her symptoms, plus would think she should look much sicker with this CXR. Not convinced that the finding on CT scan actually represent an infectious process. Also not convinced that the  RUL changes are a evolving cavitation and don't see this on f/u. Would add  pulmonary edema or viral pneumonitis to the list of potential etiology of her pulmonary infiltrates. Bibasilar aeration has gotten worse since initial film adding basilar atelectasis to the picture.   Recommendations F/u  resp viral panel 8/10 >>> Cont current abx >>> F/u echo 8/11>> Trial of diuresis  Ck ESR   Do not think she needs FOB. She gets annual PPDs all neg.  When she sees us again needs OSA eval.. ? PSG  Cont her LABA/ICS combo Cont nasal hygiene   Patient seen and examined, agree with above note.  I dictated the care and orders written for this patient under my direction.  Wesam G Yacoub, MD [phone]  05/30/2014, 9:00 AM     

## 2014-05-30 NOTE — Progress Notes (Signed)
Seen and examined.  Agree with documentation and management of Dr. Doroteo Glassman.  I agree with pulm workup and remain optimistic that this is just a bad pneumonia.  She states that she is slowly improving.

## 2014-05-31 ENCOUNTER — Inpatient Hospital Stay (HOSPITAL_COMMUNITY): Payer: Medicare Other

## 2014-05-31 ENCOUNTER — Ambulatory Visit (HOSPITAL_COMMUNITY): Admission: RE | Admit: 2014-05-31 | Payer: Medicare Other | Source: Ambulatory Visit

## 2014-05-31 ENCOUNTER — Encounter (HOSPITAL_COMMUNITY): Payer: Self-pay | Admitting: Cardiology

## 2014-05-31 DIAGNOSIS — R0602 Shortness of breath: Secondary | ICD-10-CM

## 2014-05-31 DIAGNOSIS — I420 Dilated cardiomyopathy: Secondary | ICD-10-CM | POA: Diagnosis present

## 2014-05-31 LAB — BASIC METABOLIC PANEL
ANION GAP: 12 (ref 5–15)
BUN: 11 mg/dL (ref 6–23)
CALCIUM: 9.3 mg/dL (ref 8.4–10.5)
CO2: 27 mEq/L (ref 19–32)
Chloride: 100 mEq/L (ref 96–112)
Creatinine, Ser: 1.56 mg/dL — ABNORMAL HIGH (ref 0.50–1.10)
GFR calc Af Amer: 49 mL/min — ABNORMAL LOW (ref >90)
GFR calc non Af Amer: 42 mL/min — ABNORMAL LOW (ref >90)
Glucose, Bld: 91 mg/dL (ref 70–99)
Potassium: 5 mEq/L (ref 3.7–5.3)
SODIUM: 139 meq/L (ref 137–147)

## 2014-05-31 LAB — PROCALCITONIN: Procalcitonin: 0.1 ng/mL

## 2014-05-31 LAB — CBC
HCT: 42.3 % (ref 36.0–46.0)
Hemoglobin: 13.7 g/dL (ref 12.0–15.0)
MCH: 28.3 pg (ref 26.0–34.0)
MCHC: 32.4 g/dL (ref 30.0–36.0)
MCV: 87.4 fL (ref 78.0–100.0)
PLATELETS: 241 10*3/uL (ref 150–400)
RBC: 4.84 MIL/uL (ref 3.87–5.11)
RDW: 14 % (ref 11.5–15.5)
WBC: 5.1 10*3/uL (ref 4.0–10.5)

## 2014-05-31 LAB — RESPIRATORY VIRUS PANEL
Adenovirus: NOT DETECTED
INFLUENZA A H3: NOT DETECTED
Influenza A H1: NOT DETECTED
Influenza A: NOT DETECTED
Influenza B: NOT DETECTED
METAPNEUMOVIRUS: NOT DETECTED
PARAINFLUENZA 1 A: NOT DETECTED
Parainfluenza 2: NOT DETECTED
Parainfluenza 3: NOT DETECTED
Respiratory Syncytial Virus A: NOT DETECTED
Respiratory Syncytial Virus B: NOT DETECTED
Rhinovirus: DETECTED — AB

## 2014-05-31 LAB — GLUCOSE, CAPILLARY
GLUCOSE-CAPILLARY: 79 mg/dL (ref 70–99)
GLUCOSE-CAPILLARY: 105 mg/dL — AB (ref 70–99)
GLUCOSE-CAPILLARY: 134 mg/dL — AB (ref 70–99)
Glucose-Capillary: 91 mg/dL (ref 70–99)

## 2014-05-31 LAB — RHEUMATOID FACTOR: Rhuematoid fact SerPl-aCnc: <10 IU/mL (ref <=14)

## 2014-05-31 MED ORDER — VANCOMYCIN HCL 10 G IV SOLR: 1500.0000 mg | INTRAVENOUS | Status: DC

## 2014-05-31 MED ORDER — METRONIDAZOLE 500 MG PO TABS
500.0000 mg | ORAL_TABLET | Freq: Three times a day (TID) | ORAL | Status: DC
  Administered 2014-05-31 – 2014-06-01 (×3): 500 mg via ORAL
  Filled 2014-05-31 (×6): qty 1

## 2014-05-31 MED ORDER — VANCOMYCIN HCL 10 G IV SOLR
1500.0000 mg | INTRAVENOUS | Status: DC
  Administered 2014-06-01: 1500 mg via INTRAVENOUS
  Filled 2014-05-31: qty 1500

## 2014-05-31 NOTE — Progress Notes (Addendum)
Note/chart reviewed.  Katie Aaralynn Shepheard, RD, LDN Pager #: [phone] After-Hours Pager #: [phone]  

## 2014-05-31 NOTE — Progress Notes (Signed)
Seen and examined.  Agree with documentation and management by Dr.Cook.  Slow improvement continues.  Cards does not think this is CHF/volume overload.  I think we stay the course of antibiotic treatment for now pending clinical improvement, further test results.

## 2014-05-31 NOTE — Consult Note (Signed)
CARDIOLOGY CONSULT NOTE  Patient ID: Makayla Vasquez MRN: [id number] DOB/AGE: 01/30/1981 33 y.o.  Admit date: 05/26/2014 Primary Physician Saralyn Pilar, DO Primary Cardiologist Dr. 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  She had a negative stress perfusion study in 2011 and her cardiologist at the time (Dr. Deborah Chalk) thought she had a cardiomyopathy related to her paced rhythm.  She was upgraded to a biventricular pacer in 2013.  Her last stress test in 2013 was unchanged from the previous.  EF in May 2013 was 30 - 35%.      She was admitted with dyspnea and cough.  She is being treated for a pneumonia with a cavitary right upper lobe lesion.  She was initially diagnosed with pneumonia in late July but she did not complete the course of antibiotics.  She was admitted on the 7th for IV antibiotics.  However, CCM consult thought there might be other differentials to consider such as viral pneumonitis or pulmonary edema.  Echo was ordered and the EF is as below (the same or slightly worse than previous.)  BNP on admission was only 16.8.  She has improved over the course of the hospitalization.   Blood cultures x 2 have been negative since 8/7  She reports that at home she was not getting any SOB, PND or orthopnea prior to the pneumonia.  She has no weight gain or swelling.  The patient denies any new symptoms such as chest discomfort, neck or arm discomfort. There has been no presyncope or syncope.    Past Medical History  Diagnosis Date  . Heart palpitations   . Cardiac pacemaker     Since age 81. Last generator change in 2004  . Congenital complete AV block   . Obesity   . GERD (gastroesophageal reflux disease)   . Asymptomatic LV dysfunction     Echo in Dec 2011 with EF 35 to 40%. Felt to be due to paced rhythm  . Seizures     as a child-  from high fever  . Anxiety   . Bipolar affective disorder   . Depression     bipolar  . Pacemaker     4-chamber  . Heart murmur     when I was little  . Carpal tunnel syndrome of right wrist   . Constipation   . Anemia     after child birth was put on Iron.  . Seasonal allergies   . CHF (congestive heart failure)     R/T 4-chamber pacemaker  . Automatic implantable cardioverter-defibrillator in situ     medtronic pacemaker   . Asthma     seasonal allergies   . Arthritis     rheumatoid arthritis- mild, no rheumatology care     Past Surgical History  Procedure Laterality Date  . Throat surgery  1994    s/p laser treatment  . Cesarean section    . Insert / replace / remove pacemaker      2001  . Cholecystectomy    . Iud removal  11/03/2011    Procedure: INTRAUTERINE DEVICE (IUD) REMOVAL;  Surgeon: Myra C. Marice Potter, MD;  Location: [facility];  Service: Gynecology;  Laterality: N/A;  . Cyst excision  12/10/2012    THYROID  . Thyroglossal duct cyst N/A 12/10/2012    Procedure: REVISION OF THYROGLOSSAL DUCT CYST EXCISION;  Surgeon:  Serena Colonel, MD;  Location: [facility] OR;  Service: ENT;  Laterality: N/A;  Revision of Thyroglossal Duct Cyst Excision  . Nasal fracture surgery      /w plate   . Carpal tunnel with cubital tunnel Right 07/26/2013    Procedure: RIGHT LIMITED OPEN CARPAL TUNNEL RELEASE ,  RIGHT CUBITAL TUNNEL RELEASE, INSITU VERSES ULNAR NERVE DECOMPRESSION AND ANTERIOR TRANSPOSITION;  Surgeon: Dominica Severin, MD;  Location: [facility] OR;  Service: Orthopedics;  Laterality: Right;    Allergies  Allergen Reactions  . Sertraline Hcl Hives   Prescriptions prior to admission  Medication Sig Dispense Refill  . albuterol (PROVENTIL HFA;VENTOLIN HFA) 108 (90 BASE) MCG/ACT inhaler Inhale 2 puffs into the lungs every 4 (four) hours as needed for wheezing or shortness of breath.  1 each  12  . Azelastine-Fluticasone (DYMISTA) 137-50 MCG/ACT SUSP Place 1-2 puffs into the nose at bedtime.  1 Bottle   prn  . budesonide-formoterol (SYMBICORT) 80-4.5 MCG/ACT inhaler Inhale 2 puffs into the lungs 2 (two) times daily. 2 puffs then rinse mouth, twice daily      . carvedilol (COREG) 3.125 MG tablet Take 1 tablet (3.125 mg total) by mouth 2 (two) times daily with a meal.  60 tablet  11  . cetirizine (ZYRTEC) 10 MG tablet Take 1 tablet (10 mg total) by mouth daily.  30 tablet  3  . fluticasone (FLONASE) 50 MCG/ACT nasal spray Place 2 sprays into both nostrils daily.  16 g  6  . metFORMIN (GLUCOPHAGE) 500 MG tablet Take 1 tablet (500 mg total) by mouth 2 (two) times daily with a meal.  60 tablet  3  . fluconazole (DIFLUCAN) 150 MG tablet Take 1 tablet (150 mg total) by mouth once. Repeat dose on Day 3 if persistent symptoms.  2 tablet  0   Family History  Problem Relation Age of Onset  . Heart disease Mother   . Hypertension Mother   . Heart disease Father   . Heart disease Sister     cardiomegaly and atherosclerosis    History   Social History  . Marital Status: Single    Spouse Name: N/A    Number of Children: 1  . Years of Education: N/A   Occupational History  . CNA    Social History Main Topics  . Smoking status: Former Smoker -- 0.25 packs/day for .5 years    Types: Cigarettes    Quit date: 07/25/1996  . Smokeless tobacco: Never Used  . Alcohol Use: No  . Drug Use: No  . Sexual Activity: Yes    Birth Control/ Protection: Implant     Comment: placed 04/2011   Other Topics Concern  . Not on file   Social History Narrative  . No narrative on file     ROS:    As stated in the HPI and negative for all other systems.  Physical Exam: Blood pressure 93/51, pulse 79, temperature 98.5 F (36.9 C), temperature source Oral, resp. rate 20, height 5\' 6"  (1.676 m), weight 226 lb 13.7 oz (102.9 kg), SpO2 90.00%.  GENERAL:  Well appearing HEENT:  Pupils equal round and reactive, fundi not visualized, oral mucosa unremarkable NECK:  No jugular venous distention, waveform within  normal limits, carotid upstroke brisk and symmetric, no bruits, no thyromegaly LYMPHATICS:  No cervical, inguinal adenopathy LUNGS:  Clear to auscultation bilaterally BACK:  No CVA tenderness CHEST:  Well healed pacemaker pocket.  HEART:  PMI not displaced or sustained,S1 and S2 within normal  limits, no S3, no S4, no clicks, no rubs, no murmurs ABD:  Flat, positive bowel sounds normal in frequency in pitch, no bruits, no rebound, no guarding, no midline pulsatile mass, no hepatomegaly, no splenomegaly, obese EXT:  2 plus pulses throughout, no edema, no cyanosis no clubbing SKIN:  No rashes no nodules NEURO:  Cranial nerves II through XII grossly intact, motor grossly intact throughout PSYCH:  Cognitively intact, oriented to person place and time  Labs: Lab Results  Component Value Date   BUN 9 05/26/2014   Lab Results  Component Value Date   CREATININE 0.93 05/26/2014   Lab Results  Component Value Date   NA 141 05/26/2014   K 3.9 05/26/2014   CL 103 05/26/2014   CO2 23 05/26/2014   No results found for this basename: TROPONINI   Lab Results  Component Value Date   WBC 5.0 05/26/2014   HGB 14.4 05/26/2014   HCT 43.4 05/26/2014   MCV 85.9 05/26/2014   PLT 256 05/26/2014    Lab Results  Component Value Date   ALT 16 05/26/2014   AST 23 05/26/2014   ALKPHOS 70 05/26/2014   BILITOT 0.8 05/26/2014     Radiology:   CXR:   0536 hr. The left subclavian pacemaker leads appear unchanged within the right atrium, right ventricle and coronary sinus. There is  stable cardiomegaly. Nodular airspace opacities throughout both  lungs have not significantly changed. There is no significant  pleural effusion or evidence of pneumothorax. The osseous structures  appear unchanged.   ECHO:  Left ventricle: The cavity size was normal. Wall thickness was increased in a pattern of mild LVH. Systolic function was severely reduced. The estimated ejection fraction was in the range of 25% to 30%. Diffuse hypokinesis.  Doppler parameters are consistent with abnormal left ventricular relaxation (grade 1 diastolic dysfunction). - Aortic valve: There was no stenosis. - Mitral valve: There was trivial regurgitation. - Left atrium: The atrium was mildly dilated. - Right ventricle: The cavity size was normal. Pacer wire or catheter noted in right ventricle. Systolic function was normal. - Pulmonary arteries: PA peak pressure: 24 mm Hg (S). - Inferior vena cava: The vessel was normal in size. The respirophasic diameter changes were in the normal range (>= 50%), consistent with normal central venous pressure. - Pericardium, extracardiac: A trivial pericardial effusion was identified.  EKG: NSR, rate 88, 100% ventricular pacing.    ASSESSMENT AND PLAN:   CARDIOMYOPATHY:  I do not think based on the history, physical or proBNP that her pulmonary issues are related to her HF.  I do not think that she needs further diuresis.  I suspect a primary pulmonary process.  I reviewed office notes extensively.  She was on Cozaar at one point but this is no longer on I suspect because of the hypotension.  She has a pizza box in the room.  We talked about salt restriction.  I would continue Coreg and it we ever can start an ARB.     HTN:  BP is actually low and precludes titration of her meds.    PULMONARY INFILTRATE:  The very low proBNP has a very high negative predictive value to argue that the LV wall stress is not elevated and would argue against pulmonary edema as the etiology.  The EF is low but not markedly different than previous.     SignedRollene Rotunda 05/31/2014, 10:52 AM

## 2014-05-31 NOTE — Progress Notes (Signed)
Name: Makayla Vasquez  MRN: [id number]  DOB: December 02, 1980  ADMISSION DATE: 05/26/2014  CONSULTATION DATE: 8/10  REFERRING MD : Gwendlyn Deutscher  PRIMARY SERVICE: FPTS  CHIEF COMPLAINT/reason for consult:  Abnormal CT scan. ? Cavitary lung lesion.  BRIEF PATIENT DESCRIPTION:  morbidly obese female w/ known h/o congenital CHB s/p PPM, chronic systolic HF (last EF 79-48% 2013), HTN, DM, and mild intermittent asthma f/b Dr Annamaria Boots. Admitted on 8/8 w/ working dx of persistent PNA in spite of out-pt abx. CT of chest was obtained. This was interpreted as multi-focal lung opacities w/ concern for evolving RUL cavitary process. She was treated w// broad spectrum abx and supportive care. To date all cultures are negative. Her BNP was only 16.8 on admit. PCCM asked to see as she has not had significant improvement in her symptom burden as well as to assist w/ interpretation of her abnormal CT scan.  SIGNIFICANT EVENTS / STUDIES:  CT chest 8/7: Severe bilateral multifocal lung opacities, ? developing cavitary lesion in the right upper lobe as described above.  Echo 8/10>>> - Normal LV size with mild LV hypertrophy. EF 25-30% with diffuse hypokinesis. Normal RV size and systolic function. No significant valvular abnormalities. Gd I diastolic dysfxn.    CULTURES:  BCX2 8/7>>>  UC 8/7: neg  ANTIBIOTICS:  vanc 8/7>>>  Zosyn 8/7>>>8/10  levaquin 8/10>>>    SUBJECTIVE:  Feels a little better    BP 93/51  Pulse 79  Temp(Src) 98.5 F (36.9 C) (Oral)  Resp 20  Ht 5' 6"  (1.676 m)  Wt 102.9 kg (226 lb 13.7 oz)  SpO2 90% O2 at: 4 L    Intake/Output Summary (Last 24 hours) at 05/31/14 0851 Last data filed at 05/30/14 1300  Gross per 24 hour  Intake    240 ml  Output      1 ml  Net    239 ml   PHYSICAL EXAMINATION:  General: Obese AAf, no acute distress.  Neuro: Awake, oriented, no focal def  HEENT: [place], no JVD phonation hoarse  Cardiovascular: rrr  Lungs: Basilar rales posteriorly Abdomen: Soft, non-tender   Musculoskeletal: Intact  Skin: Intact  CBC No results found for this basename: WBC, HGB, HCT, PLT,  in the last 72 hours  Coag's No results found for this basename: APTT, INR,  in the last 72 hours  BMET No results found for this basename: NA, K, CL, CO2, BUN, CREATININE, GLUCOSE,  in the last 72 hours  Electrolytes No results found for this basename: CALCIUM, MG, PHOS,  in the last 72 hours  Sepsis Markers Recent Labs     05/29/14  1505  05/31/14  0545  PROCALCITON  <0.10  0.10    ABG No results found for this basename: PHART, PCO2ART, PO2ART,  in the last 72 hours  Liver Enzymes No results found for this basename: AST, ALT, ALKPHOS, BILITOT, ALBUMIN,  in the last 72 hours  Cardiac Enzymes Recent Labs     05/29/14  1530  PROBNP  49.8    Glucose Recent Labs     05/29/14  2130  05/30/14  0622  05/30/14  1108  05/30/14  1614  05/30/14  2218  05/31/14  0639  GLUCAP  105*  112*  119*  99  126*  79    Imaging Dg Chest Port 1 View  05/31/2014   CLINICAL DATA:  Shortness of breath and cough. follow up infiltrates.  EXAM: PORTABLE CHEST - 1 VIEW  COMPARISON:  Portable chest  05/29/2014.  CT 05/26/2014.  FINDINGS: 0536 hr. The left subclavian pacemaker leads appear unchanged within the right atrium, right ventricle and coronary sinus. There is stable cardiomegaly. Nodular airspace opacities throughout both lungs have not significantly changed. There is no significant pleural effusion or evidence of pneumothorax. The osseous structures appear unchanged.  IMPRESSION: No significant change in multi lobar pneumonia. Stable mild cardiomegaly.   Electronically Signed   By: Camie Patience M.D.   On: 05/31/2014 08:04   Dg Chest Port 1 View  05/29/2014   CLINICAL DATA:  Congestion  EXAM: PORTABLE CHEST - 1 VIEW  COMPARISON:  May 26, 2014 chest radiograph and chest CT  FINDINGS: There is increase consolidation in both lung bases. There is more subtle patchy infiltrate in both upper  lobes, better seen on CT. Heart is enlarged with pulmonary vascularity normal. No adenopathy. Pacemaker leads are attached to the left and right ventricle.  IMPRESSION: Increase in bibasilar consolidation. More subtle patchy infiltrate in the upper lobes is better seen on recent CT. Stable cardiomegaly.   Electronically Signed   By: Lowella Grip M.D.   On: 05/29/2014 15:30  Aeration looks a little better.    ASSESSMENT / PLAN:  Dyspnea in setting of Diffuse bilateral Pulmonary infiltrates.  Probable sinusitis  Intermittent asthma  Basilar atelectasis  Systolic HF (EF 88-82%-->[id number] since 2013 now 79-15%) Diastolic dysfxn Probable OSA   Discussion  Her presentation could be explained by PNA but she has not had sig improvement in her symptoms, plus would think she should look much sicker with this CXR. Aeration a little better. ESR: 62.  PCT is negative X 2. Viral pneumonitis complicated by ATX and element of edema may be the best explanation for CXR changes. The RUL Concern for cavitation is not evident on  F/u CXR.   Recommendations  F/u resp viral panel 8/10 >>>  Will send auto-immune panel 8/12>>> Would complete 10d course mono-rx w/ levaquin  Consider trial of steroids (will discuss w/ Pulm team) Do not think she needs FOB. She gets annual PPDs all neg.  When she sees [place] again needs OSA eval.. ? PSG  Cont her LABA/ICS combo  Needs walking oximetry  Cont nasal hygiene   If respiratory virus panel is negative will attempt high dose steroids.  Continue diureses.  Will need a sleep study as outpatient, continue current regiment of bronchodilators.  Patient seen and examined, agree with above note.  I dictated the care and orders written for this patient under my direction.  Rush Farmer, MD [phone]

## 2014-05-31 NOTE — Progress Notes (Signed)
ANTIBIOTIC CONSULT NOTE - FOLLOW UP  Pharmacy Consult for vancomycin Indication: PNA  Allergies  Allergen Reactions  . Sertraline Hcl Hives    Patient Measurements: Height: 5\' 6"  (167.6 cm) Weight: 226 lb 13.7 oz (102.9 kg) IBW/kg (Calculated) : 59.3   Vital Signs: Temp: 98.5 F (36.9 C) (08/12 0500) Temp src: Oral (08/12 0500) BP: 93/51 mmHg (08/12 0500) Pulse Rate: 79 (08/12 0500) Intake/Output from previous day: 08/11 0701 - 08/12 0700 In: 480 [P.O.:480] Out: 2 [Urine:2] Intake/Output from this shift:    Labs:  Recent Labs  05/31/14 1210  WBC 5.1  HGB 13.7  PLT 241  CREATININE 1.56*   Estimated Creatinine Clearance: 61.5 ml/min (by C-G formula based on Cr of 1.56).  Recent Labs  05/28/14 1910  VANCOTROUGH 20.2*     Microbiology: Recent Results (from the past 720 hour(s))  URINE CULTURE     Status: None   Collection Time    05/26/14  9:04 PM      Result Value Ref Range Status   Specimen Description URINE, CLEAN CATCH   Final   Special Requests Normal   Final   Culture  Setup Time     Final   Value: 05/27/2014 00:39     Performed at 07/27/2014 Count     Final   Value: 40,000 COLONIES/ML     Performed at [facility]   Culture     Final   Value: Multiple bacterial morphotypes present, none predominant. Suggest appropriate recollection if clinically indicated.     Performed at [facility]   Report Status 05/28/2014 FINAL   Final  CULTURE, BLOOD (ROUTINE X 2)     Status: None   Collection Time    05/26/14  9:51 PM      Result Value Ref Range Status   Specimen Description BLOOD RIGHT HAND   Final   Special Requests BOTTLES DRAWN AEROBIC ONLY 1CC   Final   Culture  Setup Time     Final   Value: 05/27/2014 00:40     Performed at 07/27/2014   Culture     Final   Value:        BLOOD CULTURE RECEIVED NO GROWTH TO DATE CULTURE WILL BE HELD FOR 5 DAYS BEFORE ISSUING A FINAL NEGATIVE REPORT     Performed at  [facility]   Report Status PENDING   Incomplete  CULTURE, BLOOD (ROUTINE X 2)     Status: None   Collection Time    05/26/14 10:01 PM      Result Value Ref Range Status   Specimen Description BLOOD LEFT HAND   Final   Special Requests BOTTLES DRAWN AEROBIC AND ANAEROBIC 4CC   Final   Culture  Setup Time     Final   Value: 05/27/2014 00:40     Performed at 07/27/2014   Culture     Final   Value:        BLOOD CULTURE RECEIVED NO GROWTH TO DATE CULTURE WILL BE HELD FOR 5 DAYS BEFORE ISSUING A FINAL NEGATIVE REPORT     Performed at [facility]   Report Status PENDING   Incomplete  MRSA PCR SCREENING     Status: None   Collection Time    05/27/14  2:33 AM      Result Value Ref Range Status   MRSA by PCR NEGATIVE  NEGATIVE Final   Comment:  The GeneXpert MRSA Assay (FDA     approved for NASAL specimens     only), is one component of a     comprehensive MRSA colonization     surveillance program. It is not     intended to diagnose MRSA     infection nor to guide or     monitor treatment for     MRSA infections.    Anti-infectives   Start     Dose/Rate Route Frequency Ordered Stop   05/31/14 1400  metroNIDAZOLE (FLAGYL) tablet 500 mg     500 mg Oral 3 times per day 05/31/14 1052     05/31/14 0200  vancomycin (VANCOCIN) 1,250 mg in sodium chloride 0.9 % 250 mL IVPB  Status:  Discontinued     1,250 mg 166.7 mL/hr over 90 Minutes Intravenous Every 12 hours 05/30/14 1334 05/31/14 1322   05/30/14 1400  vancomycin (VANCOCIN) 2,000 mg in sodium chloride 0.9 % 500 mL IVPB     2,000 mg 250 mL/hr over 120 Minutes Intravenous NOW 05/30/14 1334 05/30/14 1657   05/29/14 1230  levofloxacin (LEVAQUIN) tablet 750 mg     750 mg Oral Daily 05/29/14 1216     05/27/14 1330  metroNIDAZOLE (FLAGYL) tablet 2,000 mg     2,000 mg Oral  Once 05/27/14 1321 05/27/14 1408   05/27/14 1200  vancomycin (VANCOCIN) IVPB 1000 mg/200 mL premix  Status:  Discontinued      1,000 mg 200 mL/hr over 60 Minutes Intravenous Every 8 hours 05/27/14 0250 05/30/14 0904   05/27/14 0700  piperacillin-tazobactam (ZOSYN) IVPB 3.375 g  Status:  Discontinued     3.375 g 12.5 mL/hr over 240 Minutes Intravenous 3 times per day 05/27/14 0250 05/29/14 1216   05/27/14 0300  vancomycin (VANCOCIN) 2,000 mg in sodium chloride 0.9 % 500 mL IVPB     2,000 mg 250 mL/hr over 120 Minutes Intravenous  Once 05/27/14 0250 05/27/14 0531   05/26/14 2345  vancomycin (VANCOCIN) IVPB 1000 mg/200 mL premix  Status:  Discontinued     1,000 mg 200 mL/hr over 60 Minutes Intravenous  Once 05/26/14 2339 05/27/14 0236   05/26/14 2345  piperacillin-tazobactam (ZOSYN) IVPB 3.375 g  Status:  Discontinued     3.375 g 12.5 mL/hr over 240 Minutes Intravenous  Once 05/26/14 2339 05/26/14 2341   05/26/14 2345  piperacillin-tazobactam (ZOSYN) IVPB 3.375 g     3.375 g 100 mL/hr over 30 Minutes Intravenous  Once 05/26/14 2341 05/27/14 0101      Assessment: Patient is a 33 y.o F currently on vancomycin and Levaquin for PNA/fever.  Spoke to [facility] medicine team-- continue vancomycin for now since patient is still febrile.  Scr increased to 1.56 today.  Vanc 8/8>>8/10; restart 8/11>> --- VT On 8/9- 20.2 on 1gm IV q8h Zosyn 8/8>>8/10 levaquin PO 8/10>> for 10 days Flagyl PO 8/12>>  BCx2 8/7>>ngtd UC 8/7>>neg FINAL MRSA 8/8 > neg FINAL 8/10 virus resp cx>>pending   Goal of Therapy:  Vancomycin trough level 15-20 mcg/ml  Plan:  1) change vancomycin to 1500mg  IV q24h 2) check BMP on 8/13 to assess renal function  Hatsumi Steinhart P 05/31/2014,1:24 PM

## 2014-05-31 NOTE — Progress Notes (Signed)
NUTRITION FOLLOW-UP  DOCUMENTATION CODES Per approved criteria  -Obesity Unspecified   INTERVENTION: -Lexicographer. -Provide Magic cup TID with meals, each supplement provides 290 kcal and 9 grams of protein. -Provide nourishment snack (fruit cups). Ordered.  NUTRITION DIAGNOSIS: Unintentional weight loss related to decreased appetite as evidenced by 8 pound weight loss in 2 weeks, Ongoing  Goal: Patient to meet >/=90% of estimated nutrition needs,unmet  Monitor:  PO intake, weight, labs, I/Os  33 y.o. female  Admitting Dx: Pneumonia  ASSESSMENT: 33 y.o. female presenting with shortness of breath and cough (intermittently productive of phlegmy sputum) for two weeks, worse in the past several days despite antibiotics as an outpt; CT shows bilateral / multifocal PNA with early cavitary RUL lesion. 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  8/8-Patient reports a poor PO intake for the last 2 weeks, likely secondary to acute illness. She has lost about 8 pounds (3% weight loss) over that time. Appetite is still fair with average intake 50% of meals. She is eating because she has to. She is open to trying nutrition supplements.  8/12-Pt reports her appetite is improving. Meal completion is varied from 0-100%, however noticed pt had snacks, fruits, and food brought from home in room. Pt reports she does not like her cardiac diet as she reports there is no taste. Pt reports she also did not like the [employer] and would like an alternative oral supplement. Pt is agreeable to [employer]. Pt also requests nourishment snacks between her meals, such as fruit cups. Will order.   Height: Ht Readings from Last 1 Encounters:  05/30/14 5\' 6"  (1.676 m)    Weight: Wt Readings from Last 1 Encounters:  05/27/14 226 lb 13.7 oz (102.9 kg)   BMI:  Body mass index is 36.63  kg/(m^2). Patient is obese, class II  Re-Estimated Nutritional Needs: Kcal: 1950-2100 kcal Protein: 95-105 g  Fluid: >2.1 L/day  Skin: Intact  Diet Order: Cardiac  Intake/Output Summary (Last 24 hours) at 05/31/14 1236 Last data filed at 05/30/14 1300  Gross per 24 hour  Intake    240 ml  Output      0 ml  Net    240 ml    Last BM: 8/9  Labs:   Recent Labs Lab 05/26/14 2201  NA 141  K 3.9  CL 103  CO2 23  BUN 9  CREATININE 0.93  CALCIUM 9.2  GLUCOSE 96    CBG (last 3)   Recent Labs  05/30/14 2218 05/31/14 0639 05/31/14 1111  GLUCAP 126* 79 105*    Scheduled Meds: . antiseptic oral rinse  7 mL Mouth Rinse BID  . azelastine  1 spray Each Nare QHS  . budesonide-formoterol  2 puff Inhalation BID  . carvedilol  3.125 mg Oral BID WC  . chlorhexidine  15 mL Mouth Rinse BID  . enoxaparin (LOVENOX) injection  50 mg Subcutaneous Q24H  . feeding supplement (RESOURCE BREEZE)  1 Container Oral BID BM  . fluticasone  1 spray Each Nare QHS  . insulin aspart  0-5 Units Subcutaneous QHS  . insulin aspart  0-9 Units Subcutaneous TID WC  . levofloxacin  750 mg Oral Daily  . loratadine  10 mg Oral Daily  . metroNIDAZOLE  500 mg Oral 3 times per day  . vancomycin  1,250 mg Intravenous Q12H    Continuous Infusions:    Past  Medical History  Diagnosis Date  . Cardiac pacemaker     Since age 55. Last generator change in 2004  . Congenital complete AV block   . Obesity   . GERD (gastroesophageal reflux disease)   . Asymptomatic LV dysfunction     Echo in Dec 2011 with EF 35 to 40%. Felt to be due to paced rhythm  . Seizures     as a child- from high fever  . Anxiety   . Bipolar affective disorder   . Depression     bipolar  . Carpal tunnel syndrome of right wrist   . Asthma     seasonal allergies   . Arthritis     rheumatoid arthritis- mild, no rheumatology care     Past Surgical History  Procedure Laterality Date  . Throat surgery  1994    s/p laser  treatment  . Cesarean section    . Insert / replace / remove pacemaker      2001  . Cholecystectomy    . Iud removal  11/03/2011    Procedure: INTRAUTERINE DEVICE (IUD) REMOVAL;  Surgeon: Myra C. Marice Potter, MD;  Location: [facility] ORS;  Service: Gynecology;  Laterality: N/A;  . Cyst excision  12/10/2012    THYROID  . Thyroglossal duct cyst N/A 12/10/2012    Procedure: REVISION OF THYROGLOSSAL DUCT CYST EXCISION;  Surgeon: Serena Colonel, MD;  Location: [facility] OR;  Service: ENT;  Laterality: N/A;  Revision of Thyroglossal Duct Cyst Excision  . Nasal fracture surgery      /w plate   . Carpal tunnel with cubital tunnel Right 07/26/2013    Procedure: RIGHT LIMITED OPEN CARPAL TUNNEL RELEASE ,  RIGHT CUBITAL TUNNEL RELEASE, INSITU VERSES ULNAR NERVE DECOMPRESSION AND ANTERIOR TRANSPOSITION;  Surgeon: Dominica Severin, MD;  Location: [facility] OR;  Service: Orthopedics;  Laterality: Right;   Marijean Niemann, MS, Provisional LDN Pager # [phone] After hours/ weekend pager # [phone]

## 2014-05-31 NOTE — Progress Notes (Addendum)
Family Medicine Teaching Service Daily Progress Note Intern Pager: [phone]  Patient name: Makayla Vasquez Medical record number: [id number] Date of birth: 1980-11-07 Age: 33 y.o. Gender: female  Primary Care Provider: Nobie Putnam, DO Consultants: None Code Status: Full code  Pt Overview and Major Events to Date:  8/8: patient admitted; started vanc/zosyn 8/9: PNA treatment with vanc/zosyn and O2 [place] 8/10: PNA treatment with vanc/zosyn and O2 [place]  8/11: Transitioned to PO abx (levoquin), Echo today 8/12: Restarted vancomycin for fever, cardiology to see pt today  Assessment and Plan: Makayla Vasquez is a 33 y.o. Female with a PMH significant for asthma,prediabetes, and chronic systolic heart failure and congenital complete AV block s/p pacemaker since age 65 who p/w shortness of breath and cough subsequently found to have bilateral multifocal PNA with early cavitary RUL lesion.   #PNA - Symptoms improving. CTA (8/7) showed bilateral, multifocal infiltrates with cavitary right upper lobe lesion. On admission, pt's O2 reqs were 4L (none at home), improved to 2L. HIV non-reactive. Recent PPD negative. Pt denies N/V/F. Of note, pt failed outpt tx w/ Augmentin and doxycycline. Unclear etiology to date, DDx includes pulmonary edema, PNA, viral pneumonitis. BCx shows no growth at 5 days.Pulmonology consulted, appreciate recs. Repeat CXR: mild improvement, no signs of RUL cavitary lesion, Echo: decreased EF (25-30%), RVP pending, procalcitonin 0.10, ESR elevated at 62, RVP: pending - Per pulm: most likely viral pneumonitis complicated by ATX and mild edema rather than a bacterial super-infection. Sent AI panel, consider steroids in setting of negative RVP. Monotherapy w/ Levaquin for 10 days. - f/u RVP: - D'c vanc, continue monotherapy w/ levaquin (started 8/10) for a total course of 10 days - Continue to f/u blood cultures; if positive, given pacer, will need to talk to cards / ID (see  below) - Pt's O2 reqs still not improved; pt failed weaning yeaterday, continue to assess and wean as tolerated   #Cardiac - h/o HTN, chronic CHF/congenital AV block s/p pacemaker since age 23. Repeat Echo (8/11): 25-35% (decreased from 30-25% IN 02/2012). Worsening EF may be contributing to pt's dyspnea. BP low at 93/51 today. - Cardiology consult today, appreciate recs: most likely a primary pulmonary etiology, less likely that pulm issues related to HF w/ low pro-BNP and mildly changed EF. Also, pt started on cozaar in the past, did not tolerate 2/2 hypotension, no need to add any other agents aside from coreg.   - BP decreasing, CTM; continue home Coreg for now.  - if blood Cx positive, will need to consult ID / cardiology for recs re: pacer removal or other options  #Asthma / seasonal allergies - current sx most likely related to PNA as above; sees Dr. Annamaria Boots outpt  - continue home albuterol, Flonase, Symbicort, Dymista; loratadine sub for home Zyrtec   #Prediabetes - A1c recently 6.3, on metformin at home. Pt's BG wnl. - Continue SSI - hold metformin given recent contrast load   FEN/GI: saline lock IV. Heart healthy diet PPx: lovenox  Disposition: Continue current management plan. Patient not ready for discharge currently.   Subjective:  Patient states she feels better, dyspnea and cough have improved. She denies any pain. Reports that while ambulating yesterday, she only experienced some dizziness but no SOB. No concerns/complaints voiced at this time.  Objective: Temp:  [98 F (36.7 C)-101.4 F (38.6 C)] 98.5 F (36.9 C) (08/12 0500) Pulse Rate:  [78-98] 79 (08/12 0500) Resp:  [16-20] 20 (08/12 0500) BP: (93-101)/(51-64) 93/51 mmHg (08/12 0500)  SpO2:  [89 %-96 %] 96 % (08/12 0500)  Physical Exam: General: Laying in bed, in no acute distress Cardiovascular: Regular rate and rhythm Respiratory: shallow breathing, mildly labored. Coarse breath sounds. No wheezing or  crackles. Abdomen: Soft, non-tender, +BS Extremities: No edema  Laboratory:  Recent Labs Lab 05/26/14 2201  WBC 5.0  HGB 14.4  HCT 43.4  PLT 256    Recent Labs Lab 05/26/14 2201  NA 141  K 3.9  CL 103  CO2 23  BUN 9  CREATININE 0.93  CALCIUM 9.2  PROT 8.5*  BILITOT 0.8  ALKPHOS 70  ALT 16  AST 23  GLUCOSE 96   BNP- 49.8 (16.8 admission) Procalcitonin - 0.10 (<0.10 on 8/11) RVP pending  Imaging/Diagnostic Tests: CXR (8/11): No significant change in multi lobar pneumonia. Stable mild  cardiomegaly  Echocardiogram (8/11): Normal LV size with mild LV hypertrophy. EF 25-30% with diffuse hypokinesis. Normal RV size and systolic function. No significant valvular abnormalities.  [name], Med Student  I have seen and evaluated the above patient with Student Doctor Donneta Romberg.  I agree with the note in its entirety as we have discussed her plan of care in rounds.  [place] PGY-3

## 2014-05-31 NOTE — Plan of Care (Signed)
Problem: Phase III Progression Outcomes Goal: O2 sats > or equal to 93% on room air Outcome: Not Met (add Reason) Pt unable to ambulate on RA without o2. Desats down into the low 80s

## 2014-06-01 ENCOUNTER — Inpatient Hospital Stay (HOSPITAL_COMMUNITY): Payer: Medicare Other

## 2014-06-01 DIAGNOSIS — I428 Other cardiomyopathies: Secondary | ICD-10-CM

## 2014-06-01 DIAGNOSIS — Q246 Congenital heart block: Secondary | ICD-10-CM

## 2014-06-01 DIAGNOSIS — Z950 Presence of cardiac pacemaker: Secondary | ICD-10-CM

## 2014-06-01 DIAGNOSIS — I499 Cardiac arrhythmia, unspecified: Secondary | ICD-10-CM

## 2014-06-01 LAB — GLUCOSE, CAPILLARY
GLUCOSE-CAPILLARY: 113 mg/dL — AB (ref 70–99)
Glucose-Capillary: 81 mg/dL (ref 70–99)
Glucose-Capillary: 90 mg/dL (ref 70–99)
Glucose-Capillary: 120 mg/dL — ABNORMAL HIGH (ref 70–99)

## 2014-06-01 LAB — BASIC METABOLIC PANEL
Anion gap: 13 (ref 5–15)
BUN: 12 mg/dL (ref 6–23)
CO2: 22 mEq/L (ref 19–32)
Calcium: 8.5 mg/dL (ref 8.4–10.5)
Chloride: 103 mEq/L (ref 96–112)
Creatinine, Ser: 1.41 mg/dL — ABNORMAL HIGH (ref 0.50–1.10)
GFR calc Af Amer: 56 mL/min — ABNORMAL LOW (ref >90)
GFR calc non Af Amer: 48 mL/min — ABNORMAL LOW (ref >90)
GLUCOSE: 124 mg/dL — AB (ref 70–99)
POTASSIUM: 3.9 meq/L (ref 3.7–5.3)
Sodium: 138 mEq/L (ref 137–147)

## 2014-06-01 LAB — MPO/PR-3 (ANCA) ANTIBODIES: Serine Protease 3: <1

## 2014-06-01 LAB — ANA: ANA: POSITIVE — AB

## 2014-06-01 LAB — ANTI-NUCLEAR AB-TITER (ANA TITER)

## 2014-06-01 LAB — ANCA SCREEN W REFLEX TITER
ATYPICAL P-ANCA SCREEN: NEGATIVE
C-ANCA SCREEN: NEGATIVE
p-ANCA Screen: NEGATIVE

## 2014-06-01 MED ORDER — SODIUM CHLORIDE 0.9 % IV BOLUS (SEPSIS)
500.0000 mL | Freq: Once | INTRAVENOUS | Status: AC
  Administered 2014-06-01: 500 mL via INTRAVENOUS

## 2014-06-01 MED ORDER — METHYLPREDNISOLONE SODIUM SUCC 125 MG IJ SOLR
80.0000 mg | Freq: Two times a day (BID) | INTRAMUSCULAR | Status: DC
  Administered 2014-06-01 – 2014-06-02 (×2): 80 mg via INTRAVENOUS
  Filled 2014-06-01: qty 1.28
  Filled 2014-06-01: qty 2
  Filled 2014-06-01 (×3): qty 1.28

## 2014-06-01 NOTE — Progress Notes (Signed)
Family Medicine Teaching Service Daily Progress Note Intern Pager: [phone]  Patient name: Makayla Vasquez Medical record number: [id number] Date of birth: Apr 28, 1981 Age: 33 y.o. Gender: female  Primary Care Provider: Nobie Putnam, DO Consultants: None Code Status: Full code  Pt Overview and Major Events to Date:  8/8: patient admitted; started vanc/zosyn 8/9: PNA treatment with vanc/zosyn and O2 [place] 8/10: PNA treatment with vanc/zosyn and O2 [place]  8/11: Transitioned to PO abx (levoquin), Echo today 8/12: Restarted vancomycin for fever, cardiology to see pt today 8/13: PNA management, awaiting Pulm recs   Assessment and Plan: Makayla Vasquez is a 33 y.o. Female with a PMH significant for asthma,prediabetes, and chronic systolic heart failure and congenital complete AV block s/p pacemaker since age 72 who p/w shortness of breath and cough subsequently found to have bilateral multifocal PNA with early cavitary RUL lesion.   #PNA - Symptoms improving. CTA (8/7) showed bilateral, multifocal infiltrates with cavitary right upper lobe lesion. On admission, pt's O2 reqs were 4L (none at home), improved to 2L. HIV non-reactive. Recent PPD negative. FInal BCx shows no growth. Repeat CXR: mild improvement, no signs of RUL cavitary lesion, Echo: decreased EF (25-30%), RVP: rhinovirus positive, potential component of autoimmune dx? (her mother has lupus apparently) procalcitonin 0.10, ESR elevated at 62. - Per pulm: most likely viral pneumonitis complicated by ATX and mild edema rather than a bacterial super-infection. Sent AI panel, consider steroids in setting of negative RVP. Monotherapy w/ Levaquin for 10 days. - D'c vanc, continue levaquin (started 8/10) for a total course of 10 days - start methylpred IV, which may also help pt's BP; f/u w/ pulm recs - Pt's O2 reqs stable at 2L [place]; O2 monitoring with ambulation today - continue to wean off of O2 - IS q1-2hrs  - AI panel: RF negative, ANA  and ANCA pending - Droplet precautions in setting of positive rhinovirus - will also need outpt SLE study  #Cardiac - h/o HTN, chronic CHF/congenital AV block s/p pacemaker since age 55. Repeat Echo (8/11): 25-35% (decreased from 30-25% IN 02/2012). Worsening EF may be contributing to pt's dyspnea. BP low at 93/51 today. - Cardiology consult (8/12), appreciate recs: most likely a primary pulmonary etiology, less likely that pulm issues related to HF w/ low pro-BNP and mildly changed EF. Also, pt started on cozaar in the past, did not tolerate 2/2 hypotension, no need to add any other agents aside from coreg.   - BP decreased to 85/46, HOLD coreg for now -encourage pt with dietary compliance   #Elevated creatinine: Baseline 0.93, elevated to 1.41 today (decreased from 1.56 yesterday) - May be a factor of enoxaparin therapy and hypovolemia (pt's BP 98/52 today) - give 500cc IV bolus once - continue to monitor BP and creatinine, re-assess fluid need  #Asthma / seasonal allergies - current sx most likely related to PNA as above; sees Dr. Annamaria Boots outpt  - continue home albuterol, Flonase, Symbicort, Dymista; loratadine sub for home Zyrtec   #Prediabetes - A1c recently 6.3, on metformin at home. Pt's BG mildly elevated today. - Continue SSI - hold metformin given recent contrast load   FEN/GI: saline lock IV. Heart healthy diet PPx: lovenox  Disposition: Continue current management plan. Pt will most likely be dc'd with home O2. Potential d/c home tmw   Subjective:  Patient states she feels better, dyspnea and cough have improved. She denies any pain. Ambulated around her room yesterday w/o O2 and did not experience any SOB.  No concerns/complaints voiced at this time.  Objective: Temp:  [98.8 F (37.1 C)-98.9 F (37.2 C)] 98.9 F (37.2 C) (08/13 0535) Pulse Rate:  [78-103] 78 (08/13 0535) Resp:  [16] 16 (08/13 0535) BP: (95-101)/(52-62) 98/52 mmHg (08/13 0535) SpO2:  [87 %-95 %] 87 %  (08/13 0843)  Physical Exam: General: Laying in bed, in no acute distress Cardiovascular: Regular rate and rhythm Respiratory: shallow breathing, mildly labored. Improving diffuse coarse breath sounds. No wheezing or crackles. Abdomen: Soft, non-tender, +BS Extremities: No edema  Laboratory:  Recent Labs Lab 05/26/14 2201 05/31/14 1210  WBC 5.0 5.1  HGB 14.4 13.7  HCT 43.4 42.3  PLT 256 241    Recent Labs Lab 05/26/14 2201 05/31/14 1210 06/01/14 0604  NA 141 139 138  K 3.9 5.0 3.9  CL 103 100 103  CO2 23 27 22   BUN 9 11 12   CREATININE 0.93 1.56* 1.41*  CALCIUM 9.2 9.3 8.5  PROT 8.5*  --   --   BILITOT 0.8  --   --   ALKPHOS 70  --   --   ALT 16  --   --   AST 23  --   --   GLUCOSE 96 91 124*   RVP: rhinovirus positive  Imaging/Diagnostic Tests: CXR (8/12): Patchy interstitial and alveolar filling processes are consistent with clinical pneumonia. An element of CHF is not excluded. There has been slight deterioration since yesterday's study.  [name], Med Student  I have seen and evaluated the above patient with Student Doctor Donneta Romberg.  I agree with the note in its entirety as we have discussed her plan of care in rounds.  Langston Masker, MD Family Medicine PGY-2

## 2014-06-01 NOTE — Progress Notes (Addendum)
Name: Makayla Vasquez  MRN: [id number]  DOB: September 02, 1981  ADMISSION DATE: 05/26/2014  CONSULTATION DATE: 8/10  REFERRING MD : Gwendlyn Deutscher  PRIMARY SERVICE: FPTS  CHIEF COMPLAINT/reason for consult:  Abnormal CT scan. ? Cavitary lung lesion.   BRIEF PATIENT DESCRIPTION:  morbidly obese female w/ known h/o congenital CHB s/p PPM, chronic systolic HF (last EF 47-82% 2013), HTN, DM, and mild intermittent asthma f/b Dr Annamaria Boots. Admitted on 8/8 w/ working dx of persistent PNA in spite of out-pt abx. CT of chest was obtained. This was interpreted as multi-focal lung opacities w/ concern for evolving RUL cavitary process. She was treated w// broad spectrum abx and supportive care. To date all cultures are negative. Her BNP was only 16.8 on admit. PCCM asked to see as she has not had significant improvement in her symptom burden as well as to assist w/ interpretation of her abnormal CT scan.   SIGNIFICANT EVENTS / STUDIES:  CT chest 8/7: Severe bilateral multifocal lung opacities, ? developing cavitary lesion in the right upper lobe as described above.  Echo 8/10>>> - Normal LV size with mild LV hypertrophy. EF 25-30% with diffuse hypokinesis. Normal RV size and systolic function. No significant valvular abnormalities. Gd I diastolic dysfxn.  resp viral panel 8/9: rhino virus  ANA 8/12: positive ANCA screen 8/12: >>> RF 8/12: neg Mpo/pr-3 8/12: >>> 8/13 steroid trial>>>  CULTURES:  BCX2 8/7>>>  UC 8/7: neg  ANTIBIOTICS:  vanc 8/7>>> off Zosyn 8/7>>>8/10  levaquin 8/10>>>    SUBJECTIVE:  Feels a little better    BP 85/46  Pulse 79  Temp(Src) 99.4 F (37.4 C) (Oral)  Resp 16  Ht 5' 6" (1.676 m)  Wt 102.9 kg (226 lb 13.7 oz)  SpO2 93% O2 at: 4 L    Intake/Output Summary (Last 24 hours) at 06/01/14 1121 Last data filed at 05/31/14 1700  Gross per 24 hour  Intake    240 ml  Output      0 ml  Net    240 ml   PHYSICAL EXAMINATION:  General: Obese AAf, no acute distress.  Neuro: Awake,  oriented, no focal def  HEENT: [place], no JVD phonation hoarse  Cardiovascular: rrr  Lungs: Basilar rales posteriorly Abdomen: Soft, non-tender  Musculoskeletal: Intact  Skin: Intact  CBC Recent Labs     05/31/14  1210  WBC  5.1  HGB  13.7  HCT  42.3  PLT  241    Coag's No results found for this basename: APTT, INR,  in the last 72 hours  BMET Recent Labs     05/31/14  1210  06/01/14  0604  NA  139  138  K  5.0  3.9  CL  100  103  CO2  27  22  BUN  11  12  CREATININE  1.56*  1.41*  GLUCOSE  91  124*    Electrolytes Recent Labs     05/31/14  1210  06/01/14  0604  CALCIUM  9.3  8.5    Sepsis Markers Recent Labs     05/29/14  1505  05/31/14  0545  PROCALCITON  <0.10  0.10    ABG No results found for this basename: PHART, PCO2ART, PO2ART,  in the last 72 hours  Liver Enzymes No results found for this basename: AST, ALT, ALKPHOS, BILITOT, ALBUMIN,  in the last 72 hours  Cardiac Enzymes Recent Labs     05/29/14  1530  PROBNP  49.8    Glucose  Recent Labs     05/30/14  2218  05/31/14  0639  05/31/14  1111  05/31/14  1611  05/31/14  2142  06/01/14  0626  GLUCAP  126*  79  105*  91  134*  113*    Imaging Dg Chest Port 1 View  06/01/2014   CLINICAL DATA:  Pneumonia  EXAM: PORTABLE CHEST - 1 VIEW  COMPARISON:  Portable chest x-ray of May 31, 2014  FINDINGS: The lungs are adequately inflated. There remain patchy interstitial and alveolar densities bilaterally. The left hemidiaphragm is nearly totally obscured and there is increased obscuration of the right hemidiaphragm. The cardiac silhouette remains enlarged. The pulmonary vascularity is indistinct. The permanent pacemaker is unchanged in position. The bony structures are unremarkable.  IMPRESSION: Patchy interstitial and alveolar filling processes are consistent with clinical pneumonia. An element of CHF is not excluded. There has been slight deterioration since yesterday's study. A followup PA and  lateral chest x-ray would be useful when the patient can tolerate the procedure.   Electronically Signed   By: David  Martin[place]   On: 06/01/2014 08:12   Dg Chest Port 1 View  05/31/2014   CLINICAL DATA:  Shortness of breath and cough. follow up infiltrates.  EXAM: PORTABLE CHEST - 1 VIEW  COMPARISON:  Portable chest 05/29/2014.  CT 05/26/2014.  FINDINGS: 0536 hr. The left subclavian pacemaker leads appear unchanged within the right atrium, right ventricle and coronary sinus. There is stable cardiomegaly. Nodular airspace opacities throughout both lungs have not significantly changed. There is no significant pleural effusion or evidence of pneumothorax. The osseous structures appear unchanged.  IMPRESSION: No significant change in multi lobar pneumonia. Stable mild cardiomegaly.   Electronically Signed   By: Camie Patience M.D.   On: 05/31/2014 08:04  Aeration looks a little better.    ASSESSMENT / PLAN:  Dyspnea in setting of Diffuse bilateral Pulmonary infiltrates. ANA positive, ? Will treat as auto-immune related pneumonitis (mother has Lupus) Probable sinusitis  Intermittent asthma  Basilar atelectasis  Systolic HF (EF 71-24%-->PYKDXIP since 2013 now 38-25%) Diastolic dysfxn Probable OSA   Discussion  Her presentation could be explained by PNA but she has not had sig improvement in her symptoms, plus would think she should look much sicker with this CXR. Aeration worse. ESR: 62.  PCT is negative X 2. F/u resp viral panel 8/10: rhinovirus (doubt that this caused Viral pneumonitis). Her ANA is POSITIVE. Think at this point reasonable to treat this as auto-immune related pneumonitis.  Recommendations  F/u ANCA screen, Mpo/pr-3 relfex  Will start steroid trial today 8/13 Would complete 10d course mono-rx w/ levaquin   When she sees [place] again needs OSA eval.. ? PSG  Cont her LABA/ICS combo  Needs walking oximetry  Cont nasal hygiene   .Patient seen and examined, agree with above note.  I dictated  the care and orders written for this patient under my direction.  Rush Farmer, MD [phone]

## 2014-06-01 NOTE — Progress Notes (Signed)
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 She has dilated cardiomyopathy. She had a negative stress perfusion study in 2011 and her cardiologist at the time (Dr. Deborah Chalk) thought she had a cardiomyopathy related to her paced rhythm. She was upgraded to a biventricular pacer in 2013. Her last stress test in 2013 was unchanged from the previous. EF in May 2013 was 30-35%. Repeat echo this admission demonstrated EF of 25-30% with diffuse hypokinesis.   She presented to [facility] ER with complaint of SOB + cough. She was admitted 05/27/14 with working dx of persistent PNA in spite of out-pt abx. CT of chest with multi-focal lung opacities w/ concern for evolving RUL cavitary process. BNP was normal at 16.8. Cardiology consulted for recommendations regarding her cardiomyopathy.   Subjective: No complaints. She denies SOB (but is on supplemental O2). No orthopnea or PND. She notes that she has to take her time getting up from sitting to standing to avoid dizziness, but she denies feeling lightheaded, dizzy and no syncope/near syncope while ambulating/standing.   Objective: Vital signs in last 24 hours: Temp:  [98.8 F (37.1 C)-99.4 F (37.4 C)] 99.4 F (37.4 C) (08/13 1039) Pulse Rate:  [78-103] 79 (08/13 1039) Resp:  [16] 16 (08/13 1039) BP: (85-101)/(46-62) 85/46 mmHg (08/13 1039) SpO2:  [87 %-95 %] 93 % (08/13 1039) Last BM Date: 05/28/14  Intake/Output from previous day: 08/12 0701 - 08/13 0700 In: 240 [P.O.:240] Out: -  Intake/Output this shift:    Medications Current Facility-Administered Medications  Medication Dose Route Frequency Provider Last Rate Last Dose  . acetaminophen (TYLENOL) tablet 650 mg  650 mg Oral Q6H PRN Stephanie Coup Street, MD   650 mg at 05/30/14 2025   Or  . acetaminophen (TYLENOL) suppository 650 mg  650 mg Rectal Q6H PRN Stephanie Coup Street, MD      . albuterol (PROVENTIL) (2.5 MG/3ML) 0.083%  nebulizer solution 3 mL  3 mL Inhalation Q4H PRN Stephanie Coup Street, MD      . antiseptic oral rinse (CPC / CETYLPYRIDINIUM CHLORIDE 0.05%) solution 7 mL  7 mL Mouth Rinse BID Janit Pagan, MD   7 mL at 06/01/14 1000  . azelastine (ASTELIN) 0.1 % nasal spray 1 spray  1 spray Each Nare QHS Janit Pagan, MD   1 spray at 05/31/14 2101  . budesonide-formoterol (SYMBICORT) 80-4.5 MCG/ACT inhaler 2 puff  2 puff Inhalation BID Stephanie Coup Street, MD   2 puff at 06/01/14 [phone]  . carvedilol (COREG) tablet 3.125 mg  3.125 mg Oral BID WC Stephanie Coup Street, MD   3.125 mg at 05/31/14 1706  . chlorhexidine (PERIDEX) 0.12 % solution 15 mL  15 mL Mouth Rinse BID Janit Pagan, MD   15 mL at 06/01/14 0942  . docusate sodium (COLACE) capsule 100 mg  100 mg Oral BID PRN Stephanie Coup Street, MD      . enoxaparin (LOVENOX) injection 50 mg  50 mg Subcutaneous Q24H Suzan Slick McCallister, RPH   50 mg at 06/01/14 8527  . fluticasone (FLONASE) 50 MCG/ACT nasal spray 1 spray  1 spray Each Nare QHS Janit Pagan, MD   1 spray at 05/31/14 2101  . insulin aspart (novoLOG) injection 0-5 Units  0-5 Units Subcutaneous QHS Stephanie Coup Street, MD      . insulin aspart (novoLOG) injection 0-9 Units  0-9 Units Subcutaneous TID WC Bobbye Morton, MD   1 Units at 05/29/14 1128  .  levofloxacin (LEVAQUIN) tablet 750 mg  750 mg Oral Daily Joanna Puff, MD   750 mg at 06/01/14 0942  . loratadine (CLARITIN) tablet 10 mg  10 mg Oral Daily Stephanie Coup Street, MD   10 mg at 06/01/14 [phone]  . morphine 2 MG/ML injection 2 mg  2 mg Intravenous Q2H PRN Stephanie Coup Street, MD      . ondansetron [facility]) tablet 4 mg  4 mg Oral Q6H PRN Stephanie Coup Street, MD       Or  . ondansetron [facility]) injection 4 mg  4 mg Intravenous Q6H PRN Bobbye Morton, MD        PE: General appearance: alert, cooperative and no distress Lungs: clear to auscultation bilaterally Heart: regular rate and rhythm Extremities: no  LEE Pulses: 2+ and symmetric Skin: warm and dry Neurologic: Grossly normal  Lab Results:   Recent Labs  05/31/14 1210  WBC 5.1  HGB 13.7  HCT 42.3  PLT 241   BMET  Recent Labs  05/31/14 1210 06/01/14 0604  NA 139 138  K 5.0 3.9  CL 100 103  CO2 27 22  GLUCOSE 91 124*  BUN 11 12  CREATININE 1.56* 1.41*  CALCIUM 9.3 8.5   Assessment/Plan  Principal Problem:   Pneumonia Active Problems:   Congenital complete AV block   GERD (gastroesophageal reflux disease)   Chronic systolic heart failure   Prediabetes   Congestive dilated cardiomyopathy  1. Nonischemic, dilated cardiomyopathy: EF slightly decreased compared to prior 2D echo. EF now 25-30% (30-35% in 2013).She has a Biventricular PPM. She is euvolemic on physical exam. She was on an ARB (Cozzar) remotely, however this was continued over a year ago due to hypotension. She is on a BB as an outpatient and has been continued on low dose Coreg, 3.125 mg BID. She is hypotensive in the 80s systolic but asymptomatic with ambulation. She only notes slight dizziness if she changes positions too quickly. She is not on a standing dose of diuretic. Will defer decision to keep on BB to MD, given her hypotension.   2. ? Autoimmune related pneumonitis: ANA positive. Respiratory viral panel 05/29/14 positive for rhinovirus. Additional w/u pending. Followed by Critical Care/Pulmonology.    LOS: 6 days    Brittainy M. Sharol Harness, PA-C 06/01/2014 11:32 AM

## 2014-06-01 NOTE — Progress Notes (Signed)
Seen and examined.  Agree with the documentation and management of Dr. Michail Jewels.  Feeling slowly better.  Steroids started at [facility] suggestion.  She is anxious to go home.  I am optimistic that we can DC in am.

## 2014-06-01 NOTE — Progress Notes (Signed)
Pt. Seen and examined. Agree with the NP/PA-C note as written.  Agree that this is primarily a pulmonary process. Unfortunately, hypotension precludes the use of HF medications. Questions about dietary compliance exist as well. She does have worsening cardiomyopathy, however, has not been adequately treated on medications, therefore not a candidate for AICD upgrade at this time. BP will not allow use of b-blocker.   Chrystie Nose, MD, [facility] Attending Cardiologist [facility]

## 2014-06-01 NOTE — Clinical Documentation Improvement (Signed)
  Patient with intermittent asthma and  "bilateral, multifocal infiltrates with cavitary right upper lobe lesion. On admission, pt's O2 reqs were 4L (none at home), improved to 2L-3L today" per 8/10 Note. Also treated with IV ABX. Please clarify if patient was treated for Acute Respiratory Failure now resolving. Thank you.  Possible Clinical Conditions? - Acute Respiratory Failure resolved - Other condition    Thank You, Beverley Fiedler ,RN Clinical Documentation Specialist:  [phone]  [facility]- Health Information Management

## 2014-06-02 ENCOUNTER — Inpatient Hospital Stay (HOSPITAL_COMMUNITY): Payer: Medicare Other

## 2014-06-02 LAB — CULTURE, BLOOD (ROUTINE X 2)
CULTURE: NO GROWTH
Culture: NO GROWTH

## 2014-06-02 LAB — GLUCOSE, CAPILLARY
GLUCOSE-CAPILLARY: 145 mg/dL — AB (ref 70–99)
GLUCOSE-CAPILLARY: 159 mg/dL — AB (ref 70–99)
Glucose-Capillary: 120 mg/dL — ABNORMAL HIGH (ref 70–99)

## 2014-06-02 LAB — PROCALCITONIN: Procalcitonin: <0.1 ng/mL

## 2014-06-02 MED ORDER — PREDNISONE 50 MG PO TABS: ORAL_TABLET | ORAL | Status: DC

## 2014-06-02 MED ORDER — LEVOFLOXACIN 750 MG PO TABS: 750.0000 mg | ORAL_TABLET | Freq: Every day | ORAL | Status: DC

## 2014-06-02 NOTE — Progress Notes (Signed)
Family Medicine Teaching Service Daily Progress Note Intern Pager: [phone]  Patient name: Makayla Vasquez Medical record number: [id number] Date of birth: 12-26-1980 Age: 33 y.o. Gender: female  Primary Care Provider: Nobie Putnam, DO Consultants: None Code Status: Full code  Pt Overview and Major Events to Date:  8/8: patient admitted; started vanc/zosyn 8/9: PNA treatment with vanc/zosyn and O2 [place] 8/10: PNA treatment with vanc/zosyn and O2 [place]  8/11: Transitioned to PO abx (levoquin), Echo today 8/12: Restarted vancomycin for fever, cardiology to see pt today 8/13: PNA management, awaiting Pulm recs  8/14: d'c home  Assessment and Plan: Makayla Vasquez is a 33 y.o. Female with a PMH significant for asthma,prediabetes, and chronic systolic heart failure and congenital complete AV block s/p pacemaker since age 11 who p/w shortness of breath and cough subsequently found to have bilateral multifocal PNA with early cavitary RUL lesion.   #PNA - Symptoms improving. CTA (8/7) showed bilateral, multifocal infiltrates with cavitary right upper lobe lesion. On admission, pt's O2 reqs were 4L (none at home), improved to 2L. HIV non-reactive. Recent PPD negative. FInal BCx shows no growth. Repeat CXR: mild improvement, no signs of RUL cavitary lesion, ECHO (8/12): decreased EF (25-30%), RVP: rhinovirus positive, potential component of autoimmune dx? (her mother has lupus apparently) procalcitonin 0.10, ESR elevated at 62, RF negative, ANA +, 1:80, c-ANCA and p-ANCA negative - Per pulm: most likely viral pneumonitis complicated by ATX and mild edema rather than a bacterial super-infection. Sent AI panel, consider steroids in setting of negative RVP. Monotherapy w/ Levaquin for 10 days. - continue levaquin PO (started 8/10) for a total course of 10 days - methylpred IV x2 doses (8/13), which may also help pt's BP; f/u w/ pulm recs - Weaned off of O2 on 8/13 - IS q1-2hrs  - AI panel: RF  negative, ANA +, 1:80, c-ANCA and p-ANCA negative - Droplet precautions in setting of positive rhinovirus - will also need outpt SLE study  #Cardiac - h/o HTN, chronic CHF/congenital AV block s/p pacemaker since age 33. Repeat Echo (8/11): 25-35% (decreased from 30-25% IN 02/2012). Worsening EF may be contributing to pt's dyspnea. BP low at 93/51 today. - Cardiology consult (8/12), appreciate recs: most likely a primary pulmonary etiology, less likely that pulm issues related to HF w/ low pro-BNP and mildly changed EF. Also, pt started on cozaar in the past, did not tolerate 2/2 hypotension, no need to add any other agents aside from coreg.   - BP decreased to 85/46, HOLD coreg for now -encourage pt with dietary compliance   #Elevated creatinine: Baseline 0.93, elevated to 1.41 today (decreased from 1.56 yesterday) - May be a factor of enoxaparin therapy and hypovolemia (pt's BP 97/56 today) - give 500cc IV bolus once yesterday, tolerated well - continue to monitor BP and creatinine, re-assess fluid need  #Asthma / seasonal allergies - current sx most likely related to PNA as above; sees Dr. Annamaria Boots outpt  - continue home albuterol, Flonase, Symbicort, Dymista; loratadine sub for home Zyrtec   #Prediabetes - A1c recently 6.3, on metformin at home. Pt's BG mildly elevated today. - Continue SSI - hold metformin given recent contrast load   FEN/GI: saline lock IV. Heart healthy diet PPx: lovenox  Disposition: Continue current management plan. Potential d/c home tmw   Subjective:  Patient states she feels better, dyspnea and cough have improved. She denies any pain. Has been off of O2 since yesterday afternoon, tolerating well. Would like to leave before  son's basketball game tomorrow morning. Objective: Temp:  [98.5 F (36.9 C)-99.4 F (37.4 C)] 98.5 F (36.9 C) (08/14 0647) Pulse Rate:  [79-92] 86 (08/14 0647) Resp:  [16] 16 (08/14 0647) BP: (85-98)/(43-64) 97/56 mmHg (08/14  0647) SpO2:  [88 %-95 %] 88 % (08/14 8184)  Physical Exam: General: Laying in bed, in no acute distress Cardiovascular: Regular rate and rhythm Respiratory: shallow breathing, unlabored. Improving diffuse coarse breath sounds. No wheezing or crackles. Abdomen: Soft, non-tender, +BS Extremities: No edema  Laboratory:  Recent Labs Lab 05/26/14 2201 05/31/14 1210  WBC 5.0 5.1  HGB 14.4 13.7  HCT 43.4 42.3  PLT 256 241    Recent Labs Lab 05/26/14 2201 05/31/14 1210 06/01/14 0604  NA 141 139 138  K 3.9 5.0 3.9  CL 103 100 103  CO2 23 27 22   BUN 9 11 12   CREATININE 0.93 1.56* 1.41*  CALCIUM 9.2 9.3 8.5  PROT 8.5*  --   --   BILITOT 0.8  --   --   ALKPHOS 70  --   --   ALT 16  --   --   AST 23  --   --   GLUCOSE 96 91 124*   RVP: rhinovirus positive AI panel: ANA +, 1:80, otherwise wnl  Imaging/Diagnostic Tests: CXR (8/13): Minimal improvement in heterogeneous airspace disease in both lower lungs.  [name], Med Student  FPTS Upper Level Addendum  O: I have reviewed the patient's current medications, labs/imaging, and diagnostic tests. BP 97/56  Pulse 86  Temp(Src) 98.5 F (36.9 C) (Oral)  Resp 16  Ht 5' 6"  (1.676 m)  Wt 226 lb 13.7 oz (102.9 kg)  SpO2 88% Gen: adult female lying in bed, off [place] O2, much improved in apperance Cardiac: RRR, no murmur appreciated Pulm: clearing breath sounds bilaterally, equal air movement, and normal WOB Abd: soft, nontender, BS+ Ext: warm, well-perfused, no LE edema  A/P: 33yo female with severe bilateral PNA, now with improving breath sounds, normal WOB, and no further O2 requirement. Hx congenital AV block with pacer in place, EF decreased now at 25-35%. Also with asthma and found to have elevated ANA this admission.  PNA / asthma / allergies - continue prior home meds, complete 10-day course of Levaquin and steroids for 5 day course, f/u with pulm outpt Cardiac - appreciate cardiology recs; holding Coreg for  relatively low BP, needs outpt f/u AKI - Cr back to baseline Elevated A1c - on metformin at home, will continue once discharged (on SSI for now while admitted)  #FEN/GI: saline lock, HH diet #Prophylaxis: Lovenox  Dispo: discharge home, today  I have independently interviewed and examined this patient in conjunction with Mrs. Donneta Romberg, MS4, whose note is documented above. The above addendum reflects my independent interview, exam, and assessment/plan. Please see also attending notes.   Emmaline Kluver, MD  PGY-3, [place] [facility]  06/02/2014, 12:22 PM [place] Service Pager: [phone] (text pages welcome through [place], password "[email or web address]")

## 2014-06-02 NOTE — Progress Notes (Signed)
PGY-3 Interim Note  CDI Query response. Pt's main reason for hospitalization included acute respiratory failure requiring O2 supplementation believed to be due to severe bilateral PNA, now resolving and off O2. See pending progress note for today.  Bobbye Morton, MD PGY-3, [facility] Medicine 06/02/2014, 11:49 AM FPTS Service pager: [phone] (text pages welcome through [facility])

## 2014-06-02 NOTE — Progress Notes (Signed)
Seen and examined.  Agree with the documentation and management of Dr.Street.  Off O2 and feeling well.  OK to DC (finally).

## 2014-06-02 NOTE — Progress Notes (Addendum)
Name: Makayla Vasquez  MRN: [id number]  DOB: [birth year]  ADMISSION DATE: 05/26/2014  CONSULTATION DATE: 8/10  REFERRING MD : Gwendlyn Deutscher  PRIMARY SERVICE: FPTS  CHIEF COMPLAINT/reason for consult:  Abnormal CT scan. ? Cavitary lung lesion.   BRIEF PATIENT DESCRIPTION:  morbidly obese female w/ known h/o congenital CHB s/p PPM, chronic systolic HF (last EF 85-46% 2013), HTN, DM, and mild intermittent asthma f/b Dr Annamaria Boots. Admitted on 8/8 w/ working dx of persistent PNA in spite of out-pt abx. CT of chest was obtained. This was interpreted as multi-focal lung opacities w/ concern for evolving RUL cavitary process. She was treated w// broad spectrum abx and supportive care. To date all cultures are negative. Her BNP was only 16.8 on admit. PCCM asked to see as she has not had significant improvement in her symptom burden as well as to assist w/ interpretation of her abnormal CT scan.   SIGNIFICANT EVENTS / STUDIES:  CT chest 8/7: Severe bilateral multifocal lung opacities, ? developing cavitary lesion in the right upper lobe as described above.  Echo 8/10>>> - Normal LV size with mild LV hypertrophy. EF 25-30% with diffuse hypokinesis. Normal RV size and systolic function. No significant valvular abnormalities. Gd I diastolic dysfxn.  resp viral panel 8/9: rhino virus  ANA 8/12: positive ANCA screen 8/12: >>> RF 8/12: neg Mpo/pr-3 8/12: >>> 8/13 steroid trial>>>  CULTURES:  BCX2 8/7>>>  UC 8/7: neg  ANTIBIOTICS:  vanc 8/7>>> off Zosyn 8/7>>>8/10  levaquin 8/10>>>    SUBJECTIVE:  Feels a little better    BP 97/56  Pulse 86  Temp(Src) 98.5 F (36.9 C) (Oral)  Resp 16  Ht _0  (1.676 m)  Wt 226 lb 13.7 oz (102.9 kg)  SpO2 88% O2 at: 4 L   No intake or output data in the 24 hours ending 06/02/14 1027 PHYSICAL EXAMINATION:  General: Obese AAf, no acute distress.  Neuro: Awake, oriented, no focal def  HEENT: [place], no JVD phonation hoarse  Cardiovascular: rrr  Lungs: Basilar rales  posteriorly Abdomen: Soft, non-tender  Musculoskeletal: Intact  Skin: Intact  CBC Recent Labs     05/31/14  1210  WBC  5.1  HGB  13.7  HCT  42.3  PLT  241    Coag's No results found for this basename: APTT, INR,  in the last 72 hours  BMET Recent Labs     05/31/14  1210  06/01/14  0604  NA  139  138  K  5.0  3.9  CL  100  103  CO2  27  22  BUN  11  12  CREATININE  1.56*  1.41*  GLUCOSE  91  124*    Electrolytes Recent Labs     05/31/14  1210  06/01/14  0604  CALCIUM  9.3  8.5    Sepsis Markers Recent Labs     05/31/14  0545  06/02/14  0450  PROCALCITON  0.10  <0.10    ABG No results found for this basename: PHART, PCO2ART, PO2ART,  in the last 72 hours  Liver Enzymes No results found for this basename: AST, ALT, ALKPHOS, BILITOT, ALBUMIN,  in the last 72 hours  Cardiac Enzymes No results found for this basename: TROPONINI, PROBNP,  in the last 72 hours  Glucose Recent Labs     05/31/14  2142  06/01/14  0626  06/01/14  1126  06/01/14  1612  06/01/14  2145  06/02/14  0633  GLUCAP  134*  113*  81  90  120*  145*    Imaging Dg Chest Port 1 View  06/02/2014   CLINICAL DATA:  Follow-up pulmonary infiltrates.  EXAM: PORTABLE CHEST - 1 VIEW  COMPARISON:  06/01/2014  FINDINGS: Heterogeneous airspace disease in both lower lung zones shows minimal improvement since prior exam. Heart size is stable. Triple lead transvenous pacemaker remains in appropriate position.  IMPRESSION: Minimal improvement in heterogeneous airspace disease in both lower lungs.   Electronically Signed   By: Earle Gell M.D.   On: 06/02/2014 07:36   Dg Chest Port 1 View  06/01/2014   CLINICAL DATA:  Pneumonia  EXAM: PORTABLE CHEST - 1 VIEW  COMPARISON:  Portable chest x-ray of May 31, 2014  FINDINGS: The lungs are adequately inflated. There remain patchy interstitial and alveolar densities bilaterally. The left hemidiaphragm is nearly totally obscured and there is increased  obscuration of the right hemidiaphragm. The cardiac silhouette remains enlarged. The pulmonary vascularity is indistinct. The permanent pacemaker is unchanged in position. The bony structures are unremarkable.  IMPRESSION: Patchy interstitial and alveolar filling processes are consistent with clinical pneumonia. An element of CHF is not excluded. There has been slight deterioration since yesterday's study. A followup PA and lateral chest x-ray would be useful when the patient can tolerate the procedure.   Electronically Signed   By: David  Martinique   On: 06/01/2014 08:12  Aeration looks a little better.    ASSESSMENT / PLAN:  Dyspnea in setting of Diffuse bilateral Pulmonary infiltrates. ANA positive, ? Will treat as auto-immune related pneumonitis (mother has Lupus) Probable sinusitis  Intermittent asthma  Basilar atelectasis  Systolic HF (EF 29-19%-->TYOMAYO since 2013 now 45-99%) Diastolic dysfxn Probable OSA   Discussion  Her presentation could be explained by PNA but she has not had sig improvement in her symptoms, plus would think she should look much sicker with this CXR. Aeration worse. ESR: 62.  PCT is negative X 2. F/u resp viral panel 8/10: rhinovirus (doubt that this caused Viral pneumonitis). Her ANA is POSITIVE. Think at this point reasonable to treat this as auto-immune related pneumonitis.  Recommendations  F/u ANCA screen negative, Mpo/pr-3 relfex negative Will continue steroid for now Would complete 10d course mono-rx w/ levaquin   When she sees [place] again needs OSA eval.. ? PSG  Cont her LABA/ICS combo  Needs walking oximetry  Cont nasal hygiene  PCCM will see again on Monday.  Rush Farmer, M.D. [facility] Pulmonary/Critical Care Medicine. Pager: [phone]. After hours pager: [phone].

## 2014-06-02 NOTE — Discharge Instructions (Signed)
You were admitted for your pneumonia. You have done very well with IV antibiotics and now with antibiotics by mouth. You should continue Levaquin (levofloxacin) for 5 more days and prednisone for 3 more days. We stopped your beta-blocker (carvedilol) due to some low blood pressure. You should follow up with Dr. Althea Charon next week 8/17 at 9:15 AM. You should also schedule follow-up appointments with your cardiology and pulmonology doctors. He will help you adjust any medications and follow up on anything else that needs to be done for you. If you have any questions or concerns, you can call [phone] at any time.

## 2014-06-02 NOTE — Discharge Summary (Signed)
[facility] Discharge Summary  Patient name: Makayla Vasquez Medical record number: [id number] Date of birth: [birth year] Age: 33 y.o. Gender: female Date of Admission: 05/26/2014  Date of Discharge: 06/02/14 Admitting Physician: Andrena Mews, MD  Primary Care Provider: Nobie Putnam, DO Consultants: Pulmonology, Cardiology  Indication for Hospitalization: Worsening SOB and failure of outpatient therapy suspicious for pneumonia  Discharge Diagnoses/Problem List:   PNA / viral pneumonitis Asthma Prediabetes Chronic systolic heart failure s/p pacemaker  Congenital AV block Acute kidney injury (resolved) Elevated ANA  Disposition: Discharge to home  Discharge Condition: Stable and improved  Discharge Exam:  BP 104/69  Pulse 88  Temp(Src) 98.5 F (36.9 C) (Oral)  Resp 16  Ht _0  (1.676 m)  Wt 226 lb 13.7 oz (102.9 kg)  SpO2 95% General: Laying in bed, in no acute distress  Cardiovascular: Regular rate and rhythm, normal S1 andS2 no mrg, no JVD, well-healed pacemaker site Respiratory: shallow breathing, unlabored. Improving diffuse coarse breath sounds. No wheezing or crackles.  Abdomen: Soft, non-tender, +BS  Extremities: No edema, 2+ pulses distally NEURO: Cranial nerves II through XII grossly intact, motor grossly intact throughout   Brief Hospital Course:  Ms. Makayla Vasquez is a 33 y.o. female with a PMH of asthma, prediabetes, and chronic systolic heart failure s/p pacemaker who p/w shortness of breath and cough after failing outpatient therapy for pneumonia. CTA on admission showed bilateral, multifocal infiltrates with cavitary right upper lobe lesion. CXR on admission also showed bilateral lung opacities c/w PNA, but did not show any evidence of a RUL cavitary lesion. HIV non-reactive, recent PPD negative. Final BCx shows no growth. Follow-up CXR showed improvement and no signs of RUL cavitary lesion. On admission, pt's O2 reqs were 4L  (none at home) and she was slowly weaned to room air. At time of discharge, her O2 sats between 88% and 95%, and asymptomatic.   While admitted, pt was was seen by Pulmonology, who suspected that the etiology of her PNA was most likely viral pneumonitis complicated by atelactasis and mild edema vs. an autoimmune process. Pt had a mildly elevated ESR and an AI panel returned with ANA positive, 1:80, and otherwise normal. Pt was also seen by Cardiology w/ her h/o CHF for which a repeat ECHO was done, showing a decrease in EF (25-30%, from baseline of 30-35% in 2013). Per cardiology, in the setting of mildly decreased EF and normal BNP, pt's symptoms were most likely 2/2 a pulmonary process.While hospitalized, [name] blood pressure was decreased to SBP in the 90s, for which we held her Coreg. She did not complain of dizziness with these BPs. Pt was discharged home to complete a course of Levaquin and prednisone. On day of discharge, pt was much improved and all questions were answered.    Issues for Follow Up:  1. Positive ANA and maternal h/o SLE: F/u with Rheumatology for further work-up and management. 2. Hypotension: Asymptomatic while inpatient, held pt's home Coreg. F/u regarding re-starting and any change in dose. 3. Decreased O2 saturation: Pt was asymptomatic while satting between 88% and 95%. Pt was not on home O2 but required 4L [place] on admission. 4. Chronic systolic HF: Decreased EF (25-30%, compared to 30-35% in 2013) on ECHO, seen by cardiology, not very concerned but recommended better lifestyle modifcations, especially diet control.  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     Azelastine-Fluticasone 137-50 MCG/ACT Susp  Commonly known as:  DYMISTA  Place 1-2 puffs into the nose at bedtime.     budesonide-formoterol 80-4.5 MCG/ACT inhaler  Commonly known as:  SYMBICORT  Inhale 2 puffs into the lungs 2 (two) times daily. 2 puffs then rinse mouth, twice daily     cetirizine 10 MG tablet  Commonly known as:  ZYRTEC  Take 1 tablet (10 mg total) by mouth daily.     fluconazole 150 MG tablet  Commonly known as:  DIFLUCAN  Take 1 tablet (150 mg total) by mouth once. Repeat dose on Day 3 if persistent symptoms.     fluticasone 50 MCG/ACT nasal spray  Commonly known as:  FLONASE  Place 2 sprays into both nostrils daily.     levofloxacin 750 MG tablet  Commonly known as:  LEVAQUIN  Take 1 tablet (750 mg total) by mouth daily. Starting 06/03/14     metFORMIN 500 MG tablet  Commonly known as:  GLUCOPHAGE  Take 1 tablet (500 mg total) by mouth 2 (two) times daily with a meal.     predniSONE 50 MG tablet  Commonly known as:  DELTASONE  Take one pill daily with breakfast until finished, starting 06/03/14.       Discharge Instructions: Please refer to Patient Instructions section of EMR for full details.  Patient was counseled important signs and symptoms that should prompt return to medical care, changes in medications, dietary instructions, activity restrictions, and follow up appointments.   Follow-Up  Appointments:  Follow-up Information   Follow up with Nobie Putnam, DO On 06/05/2014. [facility] follow-up at 9:15 AM)    Specialty:  Osteopathic Medicine   Contact information:   [place] [id number] [phone]      [name], Med Student 06/02/2014, 12:30 PM  FPTS Upper Level Resident Addendum  I independently interviewed and examined this patient. Her discharge planning as outlined above was discussed and arranged by the [place] team as a whole, including attending physician Dr. Andria Frames. I have discussed the above with Mrs. Donneta Romberg, MS4 and agree with her documentation as above. The above reflects her original note which I have reviewed in full and edited for clarity without changing the character or content of the documentation.  Emmaline Kluver, MD PGY-3, [facility] 06/05/2014, 5:24 PM FPTS Service pager: [phone] (text pages welcome through [facility])

## 2014-06-05 ENCOUNTER — Ambulatory Visit: Payer: Medicare Other | Admitting: Family Medicine

## 2014-06-06 NOTE — Discharge Summary (Signed)
Seen and examined on the day of DC.  Agree with the management and documentation of Dr. Casper Harrison.

## 2014-06-12 ENCOUNTER — Telehealth: Payer: Self-pay | Admitting: Internal Medicine

## 2014-06-12 NOTE — Telephone Encounter (Signed)
I had seen her for allergies. This is a different issue. She was on the [facility] and has a follow-up appointment there, so she needs to call the [facility] at [facility] for this problem of increasing shortness of breath since she ran out of med after discharge.Makayla Vasquez

## 2014-06-12 NOTE — Telephone Encounter (Signed)
Called and spoke with pt and she is aware of CY recs.  She will call to [facility].  Nothing further is needed.

## 2014-06-12 NOTE — Telephone Encounter (Signed)
Called and  Spoke with pt and she stated that she is having SOB---was last in the hospital about 1.5 weeks ago with PNA.  She stated that they did give her a fluid pill to take and this seemed to help her but since she has finished these she has started back with SOB.  Pt requesting an appt today---CY please advise. Thanks  Last ov--04/06/2014 Next ov--10/19/215  Allergies  Allergen Reactions  . Sertraline Hcl Hives    Current Outpatient Prescriptions on File Prior to Visit  Medication Sig Dispense Refill  . albuterol (PROVENTIL HFA;VENTOLIN HFA) 108 (90 BASE) MCG/ACT inhaler Inhale 2 puffs into the lungs every 4 (four) hours as needed for wheezing or shortness of breath.  1 each  12  . Azelastine-Fluticasone (DYMISTA) 137-50 MCG/ACT SUSP Place 1-2 puffs into the nose at bedtime.  1 Bottle  prn  . budesonide-formoterol (SYMBICORT) 80-4.5 MCG/ACT inhaler Inhale 2 puffs into the lungs 2 (two) times daily. 2 puffs then rinse mouth, twice daily      . cetirizine (ZYRTEC) 10 MG tablet Take 1 tablet (10 mg total) by mouth daily.  30 tablet  3  . fluconazole (DIFLUCAN) 150 MG tablet Take 1 tablet (150 mg total) by mouth once. Repeat dose on Day 3 if persistent symptoms.  2 tablet  0  . fluticasone (FLONASE) 50 MCG/ACT nasal spray Place 2 sprays into both nostrils daily.  16 g  6  . levofloxacin (LEVAQUIN) 750 MG tablet Take 1 tablet (750 mg total) by mouth daily. Starting 06/03/14  5 tablet  0  . metFORMIN (GLUCOPHAGE) 500 MG tablet Take 1 tablet (500 mg total) by mouth 2 (two) times daily with a meal.  60 tablet  3  . predniSONE (DELTASONE) 50 MG tablet Take one pill daily with breakfast until finished, starting 06/03/14.  3 tablet  0   No current facility-administered medications on file prior to visit.

## 2014-06-14 ENCOUNTER — Encounter (HOSPITAL_COMMUNITY): Admission: RE | Payer: Self-pay | Source: Ambulatory Visit

## 2014-06-14 ENCOUNTER — Ambulatory Visit (HOSPITAL_COMMUNITY)
Admission: RE | Admit: 2014-06-14 | Payer: Medicare Other | Source: Ambulatory Visit | Admitting: Obstetrics and Gynecology

## 2014-06-14 SURGERY — SALPINGECTOMY, BILATERAL, LAPAROSCOPIC
Anesthesia: Choice | Site: Abdomen | Laterality: Bilateral

## 2014-06-16 ENCOUNTER — Ambulatory Visit (INDEPENDENT_AMBULATORY_CARE_PROVIDER_SITE_OTHER): Payer: Medicare Other | Admitting: Family Medicine

## 2014-06-16 ENCOUNTER — Inpatient Hospital Stay (HOSPITAL_COMMUNITY)
Admission: EM | Admit: 2014-06-16 | Discharge: 2014-06-21 | DRG: 189 | Disposition: A | Discharge status: Home / Self Care | Payer: Medicare Other | Attending: Family Medicine | Admitting: Family Medicine

## 2014-06-16 ENCOUNTER — Emergency Department (HOSPITAL_COMMUNITY): Payer: Medicare Other

## 2014-06-16 ENCOUNTER — Encounter (HOSPITAL_COMMUNITY): Payer: Self-pay | Admitting: Emergency Medicine

## 2014-06-16 VITALS — BP 114/51 | HR 64 | Temp 99.4°F | Wt 218.7 lb

## 2014-06-16 DIAGNOSIS — R7309 Other abnormal glucose: Secondary | ICD-10-CM

## 2014-06-16 DIAGNOSIS — Z87891 Personal history of nicotine dependence: Secondary | ICD-10-CM | POA: Diagnosis not present

## 2014-06-16 DIAGNOSIS — E282 Polycystic ovarian syndrome: Secondary | ICD-10-CM | POA: Diagnosis present

## 2014-06-16 DIAGNOSIS — Z9981 Dependence on supplemental oxygen: Secondary | ICD-10-CM

## 2014-06-16 DIAGNOSIS — K219 Gastro-esophageal reflux disease without esophagitis: Secondary | ICD-10-CM | POA: Diagnosis present

## 2014-06-16 DIAGNOSIS — I428 Other cardiomyopathies: Secondary | ICD-10-CM | POA: Diagnosis present

## 2014-06-16 DIAGNOSIS — I959 Hypotension, unspecified: Secondary | ICD-10-CM | POA: Diagnosis not present

## 2014-06-16 DIAGNOSIS — Z950 Presence of cardiac pacemaker: Secondary | ICD-10-CM | POA: Diagnosis not present

## 2014-06-16 DIAGNOSIS — R0602 Shortness of breath: Secondary | ICD-10-CM

## 2014-06-16 DIAGNOSIS — R918 Other nonspecific abnormal finding of lung field: Secondary | ICD-10-CM | POA: Diagnosis present

## 2014-06-16 DIAGNOSIS — I5022 Chronic systolic (congestive) heart failure: Secondary | ICD-10-CM | POA: Diagnosis present

## 2014-06-16 DIAGNOSIS — R0902 Hypoxemia: Secondary | ICD-10-CM | POA: Diagnosis not present

## 2014-06-16 DIAGNOSIS — Q246 Congenital heart block: Secondary | ICD-10-CM | POA: Diagnosis not present

## 2014-06-16 DIAGNOSIS — I509 Heart failure, unspecified: Secondary | ICD-10-CM | POA: Diagnosis present

## 2014-06-16 DIAGNOSIS — M069 Rheumatoid arthritis, unspecified: Secondary | ICD-10-CM | POA: Diagnosis present

## 2014-06-16 DIAGNOSIS — F319 Bipolar disorder, unspecified: Secondary | ICD-10-CM | POA: Diagnosis present

## 2014-06-16 DIAGNOSIS — T380X5A Adverse effect of glucocorticoids and synthetic analogues, initial encounter: Secondary | ICD-10-CM | POA: Diagnosis not present

## 2014-06-16 DIAGNOSIS — J4520 Mild intermittent asthma, uncomplicated: Secondary | ICD-10-CM

## 2014-06-16 DIAGNOSIS — K589 Irritable bowel syndrome without diarrhea: Secondary | ICD-10-CM | POA: Diagnosis present

## 2014-06-16 DIAGNOSIS — J45909 Unspecified asthma, uncomplicated: Secondary | ICD-10-CM | POA: Diagnosis present

## 2014-06-16 DIAGNOSIS — R0600 Dyspnea, unspecified: Secondary | ICD-10-CM | POA: Diagnosis present

## 2014-06-16 DIAGNOSIS — J9600 Acute respiratory failure, unspecified whether with hypoxia or hypercapnia: Secondary | ICD-10-CM | POA: Diagnosis not present

## 2014-06-16 DIAGNOSIS — J849 Interstitial pulmonary disease, unspecified: Secondary | ICD-10-CM

## 2014-06-16 DIAGNOSIS — Z6835 Body mass index (BMI) 35.0-35.9, adult: Secondary | ICD-10-CM | POA: Diagnosis not present

## 2014-06-16 DIAGNOSIS — I10 Essential (primary) hypertension: Secondary | ICD-10-CM

## 2014-06-16 DIAGNOSIS — I442 Atrioventricular block, complete: Secondary | ICD-10-CM | POA: Diagnosis present

## 2014-06-16 DIAGNOSIS — Z8249 Family history of ischemic heart disease and other diseases of the circulatory system: Secondary | ICD-10-CM | POA: Diagnosis not present

## 2014-06-16 DIAGNOSIS — Z79899 Other long term (current) drug therapy: Secondary | ICD-10-CM | POA: Diagnosis not present

## 2014-06-16 DIAGNOSIS — E669 Obesity, unspecified: Secondary | ICD-10-CM | POA: Diagnosis not present

## 2014-06-16 DIAGNOSIS — R7303 Prediabetes: Secondary | ICD-10-CM

## 2014-06-16 DIAGNOSIS — R7302 Impaired glucose tolerance (oral): Secondary | ICD-10-CM

## 2014-06-16 DIAGNOSIS — J9601 Acute respiratory failure with hypoxia: Secondary | ICD-10-CM

## 2014-06-16 DIAGNOSIS — J8410 Pulmonary fibrosis, unspecified: Secondary | ICD-10-CM | POA: Diagnosis not present

## 2014-06-16 LAB — CBC WITH DIFFERENTIAL/PLATELET
Basophils Absolute: 0 10*3/uL (ref 0.0–0.1)
Basophils Relative: 1 % (ref 0–1)
EOS PCT: 2 % (ref 0–5)
Eosinophils Absolute: 0.1 10*3/uL (ref 0.0–0.7)
HEMATOCRIT: 44.1 % (ref 36.0–46.0)
Hemoglobin: 14.4 g/dL (ref 12.0–15.0)
LYMPHS ABS: 1.6 10*3/uL (ref 0.7–4.0)
Lymphocytes Relative: 25 % (ref 12–46)
MCH: 27.6 pg (ref 26.0–34.0)
MCHC: 32.7 g/dL (ref 30.0–36.0)
MCV: 84.6 fL (ref 78.0–100.0)
Monocytes Absolute: 0.7 10*3/uL (ref 0.1–1.0)
Monocytes Relative: 10 % (ref 3–12)
NEUTROS ABS: 4 10*3/uL (ref 1.7–7.7)
Neutrophils Relative %: 62 % (ref 43–77)
PLATELETS: 270 10*3/uL (ref 150–400)
RBC: 5.21 MIL/uL — AB (ref 3.87–5.11)
RDW: 14.2 % (ref 11.5–15.5)
WBC: 6.3 10*3/uL (ref 4.0–10.5)

## 2014-06-16 LAB — PROTIME-INR
INR: 1.12 (ref 0.00–1.49)
Prothrombin Time: 14.4 seconds (ref 11.6–15.2)

## 2014-06-16 LAB — BASIC METABOLIC PANEL
ANION GAP: 14 (ref 5–15)
BUN: 9 mg/dL (ref 6–23)
CALCIUM: 9 mg/dL (ref 8.4–10.5)
CO2: 24 meq/L (ref 19–32)
Chloride: 100 mEq/L (ref 96–112)
Creatinine, Ser: 1.04 mg/dL (ref 0.50–1.10)
GFR calc Af Amer: 80 mL/min — ABNORMAL LOW (ref >90)
GFR calc non Af Amer: 69 mL/min — ABNORMAL LOW (ref >90)
GLUCOSE: 80 mg/dL (ref 70–99)
Potassium: 4.1 mEq/L (ref 3.7–5.3)
Sodium: 138 mEq/L (ref 137–147)

## 2014-06-16 LAB — URINALYSIS, ROUTINE W REFLEX MICROSCOPIC
Bilirubin Urine: NEGATIVE
Glucose, UA: NEGATIVE mg/dL
Ketones, ur: 15 mg/dL — AB
LEUKOCYTES UA: NEGATIVE
NITRITE: NEGATIVE
PH: 6 (ref 5.0–8.0)
Protein, ur: NEGATIVE mg/dL
SPECIFIC GRAVITY, URINE: 1.005 (ref 1.005–1.030)
UROBILINOGEN UA: 1 mg/dL (ref 0.0–1.0)

## 2014-06-16 LAB — GLUCOSE, CAPILLARY: Glucose-Capillary: 74 mg/dL (ref 70–99)

## 2014-06-16 LAB — URINE MICROSCOPIC-ADD ON

## 2014-06-16 LAB — POC URINE PREG, ED: PREG TEST UR: NEGATIVE

## 2014-06-16 LAB — PRO B NATRIURETIC PEPTIDE: Pro B Natriuretic peptide (BNP): 25.5 pg/mL (ref 0–125)

## 2014-06-16 LAB — I-STAT TROPONIN, ED: Troponin i, poc: 0.01 ng/mL (ref 0.00–0.08)

## 2014-06-16 MED ORDER — IOHEXOL 350 MG/ML SOLN
100.0000 mL | Freq: Once | INTRAVENOUS | Status: AC | PRN
  Administered 2014-06-16: 100 mL via INTRAVENOUS

## 2014-06-16 MED ORDER — FLUTICASONE PROPIONATE 50 MCG/ACT NA SUSP: 2.0000 | Freq: Every day | NASAL | Status: DC

## 2014-06-16 MED ORDER — ACCU-CHEK ADVANTAGE DIABETES KIT: PACK | Status: DC

## 2014-06-16 MED ORDER — AZELASTINE-FLUTICASONE 137-50 MCG/ACT NA SUSP: 1.0000 | Freq: Every day | NASAL | Status: DC

## 2014-06-16 MED ORDER — ACETAMINOPHEN 325 MG PO TABS: 650.0000 mg | ORAL_TABLET | Freq: Four times a day (QID) | ORAL | Status: DC | PRN

## 2014-06-16 MED ORDER — SODIUM CHLORIDE 0.9 % IJ SOLN: 3.0000 mL | INTRAMUSCULAR | Status: DC | PRN

## 2014-06-16 MED ORDER — POLYETHYLENE GLYCOL 3350 17 G PO PACK
17.0000 g | PACK | Freq: Every day | ORAL | Status: DC | PRN
  Filled 2014-06-16 (×2): qty 1

## 2014-06-16 MED ORDER — SODIUM CHLORIDE 0.9 % IJ SOLN
3.0000 mL | Freq: Two times a day (BID) | INTRAMUSCULAR | Status: DC
  Administered 2014-06-16 – 2014-06-21 (×8): 3 mL via INTRAVENOUS

## 2014-06-16 MED ORDER — FLUTICASONE PROPIONATE 50 MCG/ACT NA SUSP
1.0000 | Freq: Every day | NASAL | Status: DC
  Administered 2014-06-16 – 2014-06-17 (×2): 2 via NASAL
  Administered 2014-06-18 – 2014-06-20 (×3): 1 via NASAL
  Filled 2014-06-16: qty 16

## 2014-06-16 MED ORDER — SODIUM CHLORIDE 0.9 % IJ SOLN
3.0000 mL | Freq: Two times a day (BID) | INTRAMUSCULAR | Status: DC
  Administered 2014-06-16 – 2014-06-21 (×9): 3 mL via INTRAVENOUS

## 2014-06-16 MED ORDER — PREDNISONE 50 MG PO TABS
50.0000 mg | ORAL_TABLET | Freq: Every day | ORAL | Status: DC
  Administered 2014-06-17: 50 mg via ORAL
  Filled 2014-06-16 (×2): qty 1

## 2014-06-16 MED ORDER — FUROSEMIDE 20 MG PO TABS: 20.0000 mg | ORAL_TABLET | Freq: Every day | ORAL | Status: DC

## 2014-06-16 MED ORDER — FUROSEMIDE 20 MG PO TABS
20.0000 mg | ORAL_TABLET | Freq: Every day | ORAL | Status: DC
  Filled 2014-06-16: qty 1

## 2014-06-16 MED ORDER — SODIUM CHLORIDE 0.9 % IV SOLN: 250.0000 mL | INTRAVENOUS | Status: DC | PRN

## 2014-06-16 MED ORDER — GUAIFENESIN ER 600 MG PO TB12
600.0000 mg | ORAL_TABLET | Freq: Two times a day (BID) | ORAL | Status: DC
  Administered 2014-06-16 – 2014-06-21 (×10): 600 mg via ORAL
  Filled 2014-06-16 (×11): qty 1

## 2014-06-16 MED ORDER — BUDESONIDE-FORMOTEROL FUMARATE 80-4.5 MCG/ACT IN AERO
2.0000 | INHALATION_SPRAY | Freq: Two times a day (BID) | RESPIRATORY_TRACT | Status: DC
  Administered 2014-06-16 – 2014-06-17 (×2): 2 via RESPIRATORY_TRACT
  Filled 2014-06-16: qty 6.9

## 2014-06-16 MED ORDER — ALBUTEROL SULFATE HFA 108 (90 BASE) MCG/ACT IN AERS: 2.0000 | INHALATION_SPRAY | RESPIRATORY_TRACT | Status: DC | PRN

## 2014-06-16 MED ORDER — ALBUTEROL SULFATE (2.5 MG/3ML) 0.083% IN NEBU
2.5000 mg | INHALATION_SOLUTION | Freq: Once | RESPIRATORY_TRACT | Status: AC
  Administered 2014-06-16: 2.5 mg via RESPIRATORY_TRACT

## 2014-06-16 MED ORDER — AZELASTINE HCL 0.1 % NA SOLN
1.0000 | Freq: Every day | NASAL | Status: DC
  Administered 2014-06-16 – 2014-06-17 (×2): 2 via NASAL
  Administered 2014-06-18 – 2014-06-20 (×3): 1 via NASAL
  Filled 2014-06-16: qty 30

## 2014-06-16 MED ORDER — IPRATROPIUM-ALBUTEROL 0.5-2.5 (3) MG/3ML IN SOLN: 3.0000 mL | RESPIRATORY_TRACT | Status: DC | PRN

## 2014-06-16 MED ORDER — ALBUTEROL SULFATE (2.5 MG/3ML) 0.083% IN NEBU
5.0000 mg | INHALATION_SOLUTION | Freq: Once | RESPIRATORY_TRACT | Status: AC
  Administered 2014-06-16: 5 mg via RESPIRATORY_TRACT
  Filled 2014-06-16: qty 6

## 2014-06-16 MED ORDER — ACETAMINOPHEN 650 MG RE SUPP: 650.0000 mg | Freq: Four times a day (QID) | RECTAL | Status: DC | PRN

## 2014-06-16 MED ORDER — INSULIN ASPART 100 UNIT/ML ~~LOC~~ SOLN
0.0000 [IU] | Freq: Three times a day (TID) | SUBCUTANEOUS | Status: DC
  Administered 2014-06-17 – 2014-06-21 (×7): 2 [IU] via SUBCUTANEOUS

## 2014-06-16 MED ORDER — HEPARIN SODIUM (PORCINE) 5000 UNIT/ML IJ SOLN
5000.0000 [IU] | Freq: Three times a day (TID) | INTRAMUSCULAR | Status: DC
  Administered 2014-06-16 – 2014-06-21 (×14): 5000 [IU] via SUBCUTANEOUS
  Filled 2014-06-16 (×15): qty 1

## 2014-06-16 MED ORDER — IPRATROPIUM BROMIDE 0.02 % IN SOLN
0.5000 mg | Freq: Once | RESPIRATORY_TRACT | Status: AC
Start: 2014-06-16 — End: 2014-06-16
  Administered 2014-06-16: 0.5 mg via RESPIRATORY_TRACT
  Filled 2014-06-16: qty 2.5

## 2014-06-16 MED ORDER — LORATADINE 10 MG PO TABS
10.0000 mg | ORAL_TABLET | Freq: Every day | ORAL | Status: DC
  Administered 2014-06-17 – 2014-06-21 (×5): 10 mg via ORAL
  Filled 2014-06-16 (×5): qty 1

## 2014-06-16 NOTE — ED Notes (Signed)
Pt. O2 78% on RA, placed on 4L [place], O2 93%

## 2014-06-16 NOTE — Progress Notes (Signed)
Subjective:     Patient ID: Makayla Vasquez, female   DOB: [birth year], 33 y.o.   MRN: [id number]  HPI  Dyspnea: - Recently discharged from hospital on 06/02/14 - Stated she was breathing better at discharge from hospital and felt well for about 1.5 weeks. States she felt better while in the hospital after getting IV fluid medicine - Reports frequent productive cough with some shortness of breath, worse with activity and walking - Admits to some pleuritic chest pain, worse with cough or deep breaths - Denies any fevers/chills, abdominal pain, nausea / vomiting, leg swelling or redness - No recent worsening orthopnea, no PND  I have reviewed and updated the following as appropriate: allergies and current medications  Social Hx: Former smoker  Review of Systems  See above HPI     Objective:   Physical Exam  BP 114/51  Pulse 64  Temp(Src) 99.4 F (37.4 C) (Oral)  Wt 218 lb 11.2 oz (99.202 kg)  Gen - well-appearing, cooperative, conversational, NAD Heart - RRR, no murmurs heard Lungs - Diminished air movement bilateral without overt wheezing without appreciable improvement on exam after albuterol, some bibasilar crackles, no focal findings. Slight increased work of breathing, speaks full sentences Abd - soft, NTND, no masses, +active BS Ext - bilateral lower ext calf tenderness on palpation, no edema, peripheral pulses intact +2 b/l Skin - warm, dry, no rashes Neuro - awake, alert, oriented, gait normal      Assessment:     Shortness of breath Hypoxia Concern for PE Hx Systolic CHF, without evidence of fluid overload Hx Asthma, without overt wheezing on exam, improved s/p nebulizer treatment Recently admitted 8/11 to 8/14 for similar symptoms, suspected viral pneumonitis vs possible autoimmune pneumonitis      Plan:     - Received Albuterol neb in clinic with subjective improvement of SOB - Advised to go directly to ED given low SpO2 71 to 80% on multiple sites in  clinic. Concern for PE. Contacted ED and discussed case, recommend further work-up for hypoxia including likely Chest CT for r/o PE, may also characterize if persistent PNA or alternative pulmonary pathology - Additionally, sent rx for glucometer to pharmacy for impaired glucose tolerance, occasional CBG testing - Sent rx refill for Albuterol HFA (pt states she is out), advised inc Symbicort to daily as prescribed - Sent rx for Lasix 20mg  PO PRN as trial for SOB / edema, to see if improvement - Encouraged close follow-up with Cardiology, Pulmonology, and will follow-up with Rheumatology referral

## 2014-06-16 NOTE — ED Notes (Signed)
Pt placed into room Pt monitored by blood pressure, pulse ox, and 5 lead. Pt remains on 4L via [place]. RN Hayley made aware that pt was placed into room and is at bedside.

## 2014-06-16 NOTE — ED Notes (Signed)
Phlebotomy at the bedside  

## 2014-06-16 NOTE — ED Notes (Signed)
IV team called this writer back and made aware of need for larger cath IV.

## 2014-06-16 NOTE — ED Provider Notes (Signed)
Medical screening examination/treatment/procedure(s) were conducted as a shared visit with non-physician practitioner(s) and myself.  I personally evaluated the patient during the encounter.  Please see separate associated note for evaluation and plan.    EKG Interpretation None       Gilda Crease, MD 06/16/14 2047

## 2014-06-16 NOTE — ED Notes (Signed)
Family practice doctor at bedside.

## 2014-06-16 NOTE — ED Notes (Signed)
IV team paged for IV start. 

## 2014-06-16 NOTE — ED Provider Notes (Signed)
CSN: [id number]     Arrival date & time 06/16/14  1555 History   First MD Initiated Contact with Patient 06/16/14 1618     Chief Complaint  Patient presents with  . Shortness of Breath     (Consider location/radiation/quality/duration/timing/severity/associated sxs/prior Treatment) HPI Comments: Makayla Vasquez is a 33 y.o. female with a PMHx of chronic CHF (last echo 05/30/2014 EF 25-30%), congenital AV block, GERD, anxiety, bipolar, seasonal allergies w/ asthma component, and RA, who was recently hospitalized for PNA/pulmonary cavitary lesion on 8/7-14/15, presents to the ED from [facility] clinic for hypoxia and SOB. Patient states that after discharge from the hospital, she felt improved, but approximately 4 days after discharge she developed worsening shortness of breath with a cough and continued sputum production (clear) and DOE. Patient states that her shortness of breath is worse with deep inspiration, but denies any chest pain associated. She states that the Symbicort that she was given upon discharge helped and she did not use any albuterol. She reports having chills but has not taken her temperature after discharge. She denies any lower trinity swelling but does state that she's had bilateral calf cramping intermittently since discharge. States that she was seen at family medicine clinic today and sent here with concerns for a pulmonary embolism. She has been immobile since her hospitalization due to shortness of breath. No FHx or PMHx of DVT/PE. Denies fevers, diaphoresis, HA, vision changes, CP, hemoptysis, wheezing, chest tightness, PND, orthopnea, URI symptoms, abd pain, N/V/D/C, urinary symptoms, vaginal symptoms, paresthesias, weakness, or numbness. Denies claudication symptoms.   Patient is a 33 y.o. female presenting with shortness of breath. The history is provided by the patient. No language interpreter was used.  Shortness of Breath Severity:  Moderate Onset quality:  Gradual Duration:   10 days Timing:  Constant Progression:  Unchanged Chronicity:  Recurrent (hospitalized for PNA earlier this month) Context: activity   Relieved by:  Rest and oxygen Worsened by:  Exertion, activity and deep breathing Ineffective treatments:  None tried Associated symptoms: cough and sputum production (clear, thick)   Associated symptoms: no abdominal pain, no chest pain, no claudication, no diaphoresis, no fever, no headaches, no hemoptysis, no PND, no sore throat, no syncope, no swollen glands, no vomiting and no wheezing   Risk factors: obesity and prolonged immobilization (hospitalized 8/7-14/15)     Past Medical History  Diagnosis Date  . Cardiac pacemaker     Since age 29. Last generator change in 2004  . Congenital complete AV block   . Obesity   . GERD (gastroesophageal reflux disease)   . Asymptomatic LV dysfunction     Echo in Dec 2011 with EF 35 to 40%. Felt to be due to paced rhythm  . Seizures     as a child- from high fever  . Anxiety   . Bipolar affective disorder   . Depression     bipolar  . Carpal tunnel syndrome of right wrist   . Asthma     seasonal allergies   . Arthritis     rheumatoid arthritis- mild, no rheumatology care   . Hypertension    Past Surgical History  Procedure Laterality Date  . Throat surgery  1994    s/p laser treatment  . Cesarean section    . Insert / replace / remove pacemaker      2001  . Cholecystectomy    . Iud removal  11/03/2011    Procedure: INTRAUTERINE DEVICE (IUD) REMOVAL;  Surgeon: Hollie Salk  Inda Coke, MD;  Location: [facility]S;  Service: Gynecology;  Laterality: N/A;  . Cyst excision  12/10/2012    THYROID  . Thyroglossal duct cyst N/A 12/10/2012    Procedure: REVISION OF THYROGLOSSAL DUCT CYST EXCISION;  Surgeon: Serena Colonel, MD;  Location: [facility] OR;  Service: ENT;  Laterality: N/A;  Revision of Thyroglossal Duct Cyst Excision  . Nasal fracture surgery      /w plate   . Carpal tunnel with cubital tunnel Right 07/26/2013     Procedure: RIGHT LIMITED OPEN CARPAL TUNNEL RELEASE ,  RIGHT CUBITAL TUNNEL RELEASE, INSITU VERSES ULNAR NERVE DECOMPRESSION AND ANTERIOR TRANSPOSITION;  Surgeon: Dominica Severin, MD;  Location: [facility] OR;  Service: Orthopedics;  Laterality: Right;   Family History  Problem Relation Age of Onset  . Heart disease Mother     CHF (no details)  . Hypertension Mother   . Heart disease Father     Murmur  . Heart disease Sister 86     No details.  History of a pacemaker  . Lupus Mother    History  Substance Use Topics  . Smoking status: Former Smoker -- 0.25 packs/day for .5 years    Types: Cigarettes    Quit date: 07/25/1996  . Smokeless tobacco: Never Used  . Alcohol Use: No   OB History   Grav Para Term Preterm Abortions TAB SAB Ect Mult Living   1 1 1       1      Review of Systems  Constitutional: Positive for chills. Negative for fever and diaphoresis.  HENT: Negative for congestion, postnasal drip, rhinorrhea, sinus pressure and sore throat.   Respiratory: Positive for cough, sputum production (clear, thick) and shortness of breath. Negative for apnea, hemoptysis, chest tightness, wheezing and stridor.   Cardiovascular: Negative for chest pain, palpitations, claudication, leg swelling, syncope and PND.  Gastrointestinal: Negative for nausea, vomiting, abdominal pain, diarrhea and constipation.  Genitourinary: Negative for dysuria, urgency, hematuria, vaginal bleeding, vaginal discharge, vaginal pain and pelvic pain.  Musculoskeletal: Negative for arthralgias, joint swelling and myalgias.  Skin: Negative for color change.  Neurological: Negative for dizziness, syncope, weakness, numbness and headaches.  Psychiatric/Behavioral: Negative for confusion.  10 Systems reviewed and are negative for acute change except as noted in the HPI.     Allergies  Sertraline hcl  Home Medications   Prior to Admission medications   Medication Sig Start Date End Date Taking? Authorizing Provider   Azelastine-Fluticasone (DYMISTA) 137-50 MCG/ACT SUSP Place 1-2 puffs into the nose at bedtime. 04/06/14 04/06/15 Yes 04/08/15, MD  budesonide-formoterol (SYMBICORT) 80-4.5 MCG/ACT inhaler Inhale 2 puffs into the lungs 2 (two) times daily. 2 puffs then rinse mouth, twice daily 04/06/14  Yes 04/08/14, MD  cetirizine (ZYRTEC) 10 MG tablet Take 1 tablet (10 mg total) by mouth daily. 05/22/14  Yes Alexander 07/22/14, DO  fluconazole (DIFLUCAN) 150 MG tablet Take 1 tablet (150 mg total) by mouth once. Repeat dose on Day 3 if persistent symptoms. 05/22/14  Yes Alexander 07/22/14, DO  fluticasone (FLONASE) 50 MCG/ACT nasal spray Place 2 sprays into both nostrils daily. 12/01/13  Yes 01/29/14, MD  metFORMIN (GLUCOPHAGE) 500 MG tablet Take 1 tablet (500 mg total) by mouth 2 (two) times daily with a meal. 01/18/14  Yes 03/20/14, MD  albuterol (PROVENTIL HFA;VENTOLIN HFA) 108 (90 BASE) MCG/ACT inhaler Inhale 2 puffs into the lungs every 4 (four) hours as needed for wheezing or shortness of breath. 06/16/14  Saralyn Pilar, DO  furosemide (LASIX) 20 MG tablet Take 1 tablet (20 mg total) by mouth daily. For shortness of breath and/or leg swelling. 06/16/14   Saralyn Pilar, DO   BP 108/82  Pulse 114  Temp(Src) 99.1 F (37.3 C) (Oral)  SpO2 91% Physical Exam  Nursing note and vitals reviewed. Constitutional: She is oriented to person, place, and time. She appears well-developed and well-nourished.  Non-toxic appearance. No distress.  Temp 99.19F oral, intermittently tachycardic, slight increased WOB with tachypnea but speaking full sentences, nontoxic, NAD  HENT:  Head: Normocephalic and atraumatic.  Mouth/Throat: Oropharynx is clear and moist and mucous membranes are normal.  Eyes: Conjunctivae and EOM are normal. Right eye exhibits no discharge. Left eye exhibits no discharge.  Neck: Normal range of motion. Neck supple. No JVD present.  Cardiovascular: Regular rhythm, normal  heart sounds and intact distal pulses.  Tachycardia present.  Exam reveals no gallop and no friction rub.   No murmur heard. Regular rhythm, intermittently tachycardic, no m/r/g appreciated, distal pulses intact in all extremities  Pulmonary/Chest: No stridor. Tachypnea noted. She has decreased breath sounds (bilaterally). She has no wheezes. She has no rhonchi. She has no rales.  Slight increased WOB with tachypnea, oxygen saturations 91% on 4L via [place], but speaking full sentences without overt resp distress. Mildly decreased breath sounds in upper lung fields bilaterally without wheezes/rhonchi/rales in middle or lower lung fields. No coughing   Abdominal: Soft. Normal appearance and bowel sounds are normal. She exhibits no distension. There is no tenderness. There is no rigidity, no rebound, no guarding, no CVA tenderness, no tenderness at McBurney's point and negative Murphy's sign.  Musculoskeletal: Normal range of motion.  Moving all extremities equally. Mildly TTP in bilateral calves. Strength and sensation at baseline  Neurological: She is alert and oriented to person, place, and time. She has normal strength. No sensory deficit.  Skin: Skin is warm, dry and intact. No rash noted.  Psychiatric: She has a normal mood and affect.    ED Course  Procedures (including critical care time) Labs Review Labs Reviewed  CBC WITH DIFFERENTIAL - Abnormal; Notable for the following:    RBC 5.21 (*)    All other components within normal limits  BASIC METABOLIC PANEL - Abnormal; Notable for the following:    GFR calc non Af Amer 69 (*)    GFR calc Af Amer 80 (*)    All other components within normal limits  PROTIME-INR  PRO B NATRIURETIC PEPTIDE  URINALYSIS, ROUTINE W REFLEX MICROSCOPIC  I-STAT TROPOININ, ED  POC URINE PREG, ED    Imaging Review No results found. CTA 06/16/14: IMPRESSION:1. No evidence of acute pulmonary embolus. 2. No improvement and mild progression of widespread abnormal  pulmonary opacity since 05/26/2014. The appearance is nonspecific.Increasing lower lobe consolidation. No cavitary changes.Does this patient have a systemic disease which might explain this appearance? (possibly related to the Mild cardiomegaly and necessity of cardiac pacemaker it this age). Has bronchoscopy been considered? 3. No pleural or pericardial effusion. 4. Mild hepatic steatosis. Electronically Signed By: Augusto Gamble M.D. On: 06/16/2014 19:06   Dg Chest 2 View  05/26/2014 CLINICAL DATA: Shortness of breath and hypoxia. EXAM: CHEST 2 VIEW COMPARISON: 05/08/2014 FINDINGS: Left-sided 3 lead pacemaker remains in place. Cardiac silhouette remains mildly enlarged. Patchy opacities in the mid and lower lungs bilaterally do not appear significantly changed. No pleural effusion or pneumothorax is identified. Right upper quadrant abdominal surgical clips are noted. No acute osseous abnormality  is seen. IMPRESSION: Unchanged bilateral lung opacities consistent with pneumonia. Electronically Signed By: Sebastian Ache On: 05/26/2014 14:15   Ct Angio Chest Pe W/cm &/or Wo Cm  05/26/2014 CLINICAL DATA: Shortness of breath, hypoxia, cough, has failed outpatient pneumonia treatment EXAM: CT ANGIOGRAPHY CHEST WITH CONTRAST TECHNIQUE: Multidetector CT imaging of the chest was performed using the standard protocol during bolus administration of intravenous contrast. Multiplanar CT image reconstructions and MIPs were obtained to evaluate the vascular anatomy. CONTRAST: OMNIPAQUE IOHEXOL 350 MG/ML SOLN COMPARISON: 05/26/2014 radiographs, 05/08/14 radiograph FINDINGS: Lower lobe pulmonary arteries are somewhat obscured by extensive bilateral lower lobe consolidation. There are no filling defects appreciated within the pulmonary arterial system. The thoracic aorta shows no dissection or dilatation there is a cardiac pacing device noted. There is no discrete enlarged lymph node, although lymphoid tissue in the subcarinal  region and right hilum is mildly prominent. There is no significant pleural effusion. There is no significant pericardial effusion. There are extensive bilateral pulmonary parenchymal opacities. There is relative sparing of the lung apices. Multifocal opacity is identified bilaterally, most severely in the lower lobes, but also involving the right middle lobe and the mid to inferior aspects of both upper lobes. Consolidation is most severe at the lung bases were there are dense air bronchograms. In the upper lobes and in the mid lung zones, there is a somewhat nodular component to the infiltrate, with 1 area in the medial right upper lobe measuring 17 x 9 mm showing evidence of possible cavitation. This is seen on image 30 series 12. Images through the upper abdomen are unremarkable. There are no acute musculoskeletal findings. Review of the MIP images confirms the above findings. IMPRESSION: Severe bilateral multifocal lung opacities, concerning for severe pneumonia. There is evidence of a developing cavitary lesion in the right upper lobe as described above. Although lower lobe pulmonary arteries are evaluated in somewhat limited detail due to the extensive consolidation of lung bases, there is no evidence of filling defect within the pulmonary arterial system appreciated to suggest pulmonary embolus. Electronically Signed By: Esperanza Heir M.D. On: 05/26/2014 23:30    EKG Interpretation None      MDM   Final diagnoses:  Shortness of breath at rest    33y/o female with multiple medical issues, recently discharged from the hospital for PNA, presenting with worsening hypoxia at [facility] clinic, sent here for CT chest to r/o PE. Lung exam relatively unremarkable although pt given albuterol PTA, some slightly diminished breath sounds in upper fields bilaterally, no crackles heard at this time. Will obtain labs and CT chest. Will reassess shortly.   7:42 PM Trop neg, EKG unremarkable with no acute change  from prior, CBC w/diff WNL, BMP with mildly low GFR improved from previously and pt tolerating PO intake to help with this. BNP neg, INR WNL, awaiting CTA at this time. Pt continues to be intermittently tachypneic with HR 70-80s, BP intermittently low which is chronic for pt, and oxygen saturations maintained in 90%s on 5L via [place]. Repeat lung exam demonstrates intermittent cough, trace mild bibasilar rales but no wheezing, will repeat duoneb now given that it's been 4hrs since last albuterol and pt having increased oxygen requirement. Will reassess after CTA results. Pt will likely need admission due to oxygen requirement.  8:19 PM CTA results not crossing over. Pulled up, appears to have mild progression of pulm opacity. Will consult FM for admission. Likely will require CT surgeon consult for bronchoscopy or VATS considering persistent pulm opacity.  Dr. Doroteo Glassman from [facility] responding, will place holding orders on tele, they will come and admit now.     Donnita [place], [place] 06/16/14 2036

## 2014-06-16 NOTE — ED Notes (Signed)
Attempted report 

## 2014-06-16 NOTE — ED Notes (Signed)
Pt states she is unable to use bathroom to urinate at this time.

## 2014-06-16 NOTE — ED Notes (Signed)
Pt transported to CT scan.

## 2014-06-16 NOTE — Assessment & Plan Note (Signed)
Shortness of breath Hypoxia Concern for PE Hx Systolic CHF, without evidence of fluid overload Hx Asthma, without overt wheezing on exam, improved s/p nebulizer treatment Recently admitted 8/11 to 8/14 for similar symptoms, suspected viral pneumonitis vs possible autoimmune pneumonitis   - Received Albuterol neb in clinic with subjective improvement of SOB - Advised to go directly to ED given low SpO2 71 to 80% on multiple sites in clinic. Concern for PE. Contacted ED and discussed case, recommend further work-up for hypoxia including likely Chest CT for r/o PE, may also characterize if persistent PNA or alternative pulmonary pathology - Sent rx refill for Albuterol HFA (pt states she is out), advised inc Symbicort to daily as prescribed - Sent rx for Lasix 20mg  PO PRN as trial for SOB / edema, to see if improvement - Encouraged close follow-up with Cardiology, Pulmonology, and will follow-up with Rheumatology referral

## 2014-06-16 NOTE — Progress Notes (Signed)
Admitted pt to rm 3E22 from ED, pt alert and oriented, denied pain at this time, call bell placed within reach, admission assessment done, orders carried out. Will continue to monitor.  06/16/14 2130  Charting Type  Charting Type Initial  Neurological  Neuro (WDL) WDL  Delirium Risk Factor Assessment  Delirium Risk Factor Assessment No risk factors identified - Delirium assessment complete  HEENT  HEENT (WDL) WDL  Respiratory  Respiratory (WDL) X  Cough Non-productive  Respiratory Pattern Regular;Unlabored;Dyspnea with exertion  Chest Assessment Chest expansion symmetrical  Bilateral Breath Sounds Diminished;Clear  R Upper  Breath Sounds Diminished;Clear  Cough and Deep Breathe  Cough and Deep Breathe Yes  Cardiac  Cardiac (WDL) X  Pulse Regular  Jugular Venous Distention (JVD) No  ECG Monitor Yes  Cardiac Rhythm Ventricular paced  Telemetry Box Number [id number]  Pulse Rate ! 106  Antiarrhythmic device Yes  Antiarrhythmic device  Antiarrhythmic device Permanent Pacemaker;ICD  Vascular  Vascular (WDL) WDL  RUE Neurovascular Assessment  R Radial Pulse +2  LUE Neurovascular Assessment  L Radial Pulse +2  Integumentary  Integumentary (WDL) WDL  Braden Scale (Ages 8 and up)  Sensory Perceptions 4  Moisture 4  Activity 3  Mobility 4  Nutrition 3  Friction and Shear 3  Braden Scale Score 21  Musculoskeletal  Musculoskeletal (WDL) X  Generalized Weakness Yes  Weight Bearing Restrictions No  Gastrointestinal  Gastrointestinal (WDL) WDL  Last BM Date 06/16/14  GU Assessment  Genitourinary (WDL) WDL  Urine Characteristics  Urinary Incontinence No  Psychosocial  Psychosocial (WDL) WDL

## 2014-06-16 NOTE — ED Notes (Signed)
Pt was sent here from family practice for work up of a PE. States "they told me my oxygen was low". Pt. Denies SOB and chest pain.

## 2014-06-16 NOTE — ED Notes (Signed)
Denies any pain or SOB. Was being seen downstairs at internal med for a f/u from being here with PNA earlier this month.

## 2014-06-16 NOTE — H&P (Signed)
Family Medicine Teaching [facility] Admission History and Physical Service Pager: [phone]  Patient name: Makayla Vasquez Medical record number: [id number] Date of birth: 1980-12-30 Age: 33 y.o. Gender: female  Primary Care Provider: Saralyn Pilar, DO Consultants: none Code Status: Full   Chief Complaint: hypoxemia   Assessment and Plan: Makayla Vasquez is a 33 y.o. female presenting with hypoxemia. PMH is significant for asthma, GERD, prediabetes, and chronic systolic heart failure and congenital complete AV block s/p pacemaker since age 52.   #Hypoxemia: unsure of the cause. She had a positive ANA with maternal history of SLE during last admission. Pulmonlogy consulted and thought it could be a viral pneumonitis. Possibly autoimmune in origin. Biopsy may be needed in order to diagnose. No signs of infection with no leukocytosis and afebrile. Doesn't appear to be asthmatic in nature with no wheezing and doesn't appear to be responding to nebulizer treatment. BNP within normal range and not appearing to be fluid overloaded.  - admitted to telemetry, Dr. McDiarmid attending  - continue home medications.  - prednisone 50 mg daily  - daily weights.  - started Lasix 20 mg today to see any improvement  - sputum culture  - [place] to keep O2 >90  - consider pulm consult for BAL   #Congenital AV block: s/p pacemaker (CRT-P) since age 27  - sees Dr. Ladona Ridgel as an outpt   #Prediabetes: A1c recently 6.3, on metformin at home  - ordered for CBG's qAC+HS with sliding scale (sensitive since insulin naive)  - hold metformin given recent contrast load   #Hx of HTN: Not currently being treated and BP not elevated.   #Systolic CHF: Last admission, ECHO showing a decrease in EF (25-30%, from baseline of 30-35% in 2013). At that time cardiology recommended lifestyle modifications and diet control. BNP with normal range. No crackles on exam.  - continue lasix 20 mg  - saline lock   #Asthma  / seasonal allergies:  current sx most likely related to PNA as above; sees Dr. Maple Hudson outpt  - continue home albuterol, Flonase, Symbicort, Dymista; loratadine sub for home Zyrtec  FEN/GI: heart healthy/carb modified.  Prophylaxis: heparin subq  Disposition: admitted to telemetry for evaluation of hypoxemia pending improvement.   History of Present Illness: Makayla Vasquez is a 33 y.o. female presenting with hypoxemia. She was seen in clinic today for dyspnea. She was recently discharged from the hospital and felt well for 1.5 weeks. She's had frequent cough with some production and shortness of breath with activity. She denies any chest pain, fever or night sweats. She denies any sick contacts, nausea or vomiting. She denies any PND or orthopnea. Her breathing improves when lying down.   She received albuterol in clinic and sent to ED for evaluation of hypoxemia. She had new O2 requirement but CT ruled out PE.   Patient requiring 4L O2 to maintain saturations in the mid 90's.   Review Of Systems: Per HPI with the following additions: no changes on urination, no headache,  Otherwise 12 point review of systems was performed and was unremarkable.  Patient Active Problem List   Diagnosis Date Noted  . Dyspnea 06/16/2014  . Congestive dilated cardiomyopathy 05/31/2014  . Allergic rhinitis 05/22/2014  . Vaginal itching 05/22/2014  . Pneumonia 05/09/2014  . Prediabetes 05/09/2014  . Shortness of breath 05/08/2014  . Sterilization consult 04/20/2014  . Breast tenderness 01/18/2014  . Carpal tunnel syndrome 09/10/2012  . Constipation 03/03/2012  . Chronic systolic heart failure  02/26/2012  . Chest pain, unspecified 12/29/2011  . Post-nasal drip 10/24/2011  . Contraception 01/29/2011  . Heart palpitations   . PPM-Medtr[employer]   . Congenital complete AV block   . Bipolar affective disorder   . Obesity   . GERD (gastroesophageal reflux disease)   . Hypertension 01/05/2011  . 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 Impaired glucose tolerance 05/07/2010  . UNSPECIFIED CARDIAC DYSRHYTHMIA 05/07/2010  . RH FACTOR, NEGATIVE 04/08/2010  . Mild intermittent asthma without complication 01/11/2009  . 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 HEART BLOCK 12/17/2006  . IRRITABLE BOWEL SYNDROME 12/17/2006   Past Medical History: Past Medical History  Diagnosis Date  . Cardiac pacemaker     Since age 28. Last generator change in 2004  . Congenital complete AV block   . Obesity   . GERD (gastroesophageal reflux disease)   . Asymptomatic LV dysfunction     Echo in Dec 2011 with EF 35 to 40%. Felt to be due to paced rhythm  . Seizures     as a child- from high fever  . Anxiety   . Bipolar affective disorder   . Depression     bipolar  . Carpal tunnel syndrome of right wrist   . Asthma     seasonal allergies   . Arthritis     rheumatoid arthritis- mild, no rheumatology care   . Hypertension    Past Surgical History: Past Surgical History  Procedure Laterality Date  . Throat surgery  1994    s/p laser treatment  . Cesarean section    . Insert / replace / remove pacemaker      2001  . Cholecystectomy    . Iud removal  11/03/2011    Procedure: INTRAUTERINE DEVICE (IUD) REMOVAL;  Surgeon: Myra C. Marice Potter, MD;  Location: [facility];  Service: Gynecology;  Laterality: N/A;  . Cyst excision  12/10/2012    THYROID  . Thyroglossal duct cyst N/A 12/10/2012    Procedure: REVISION OF THYROGLOSSAL DUCT CYST EXCISION;  Surgeon: Serena Colonel, MD;  Location: [facility] OR;  Service: ENT;  Laterality: N/A;  Revision of Thyroglossal Duct Cyst Excision  . Nasal fracture surgery      /w plate   . Carpal tunnel with cubital tunnel Right 07/26/2013    Procedure: RIGHT LIMITED OPEN CARPAL TUNNEL RELEASE ,  RIGHT CUBITAL TUNNEL RELEASE, INSITU VERSES ULNAR NERVE DECOMPRESSION AND ANTERIOR TRANSPOSITION;  Surgeon: Dominica Severin, MD;  Location: [facility] OR;  Service: Orthopedics;  Laterality: Right;   Social History: History   Substance Use Topics  . Smoking status: Former Smoker -- 0.25 packs/day for .5 years    Types: Cigarettes    Quit date: 07/25/1996  . Smokeless tobacco: Never Used  . Alcohol Use: No   Additional social history: doesn't smoke, drink alcohol or use illicit drugs.   Please also refer to relevant sections of EMR.  Family History: Family History  Problem Relation Age of Onset  . Heart disease Mother     CHF (no details)  . Hypertension Mother   . Heart disease Father     Murmur  . Heart disease Sister 42     No details.  History of a pacemaker  . Lupus Mother    Allergies and Medications: Allergies  Allergen Reactions  . Sertraline Hcl Hives   No current facility-administered medications on file prior to encounter.   Current Outpatient Prescriptions on File Prior to Encounter  Medication Sig Dispense Refill  . Azelastine-Fluticasone (DYMISTA)  137-50 MCG/ACT SUSP Place 1-2 puffs into the nose at bedtime.  1 Bottle  prn  . budesonide-formoterol (SYMBICORT) 80-4.5 MCG/ACT inhaler Inhale 2 puffs into the lungs 2 (two) times daily. 2 puffs then rinse mouth, twice daily      . cetirizine (ZYRTEC) 10 MG tablet Take 1 tablet (10 mg total) by mouth daily.  30 tablet  3  . fluconazole (DIFLUCAN) 150 MG tablet Take 1 tablet (150 mg total) by mouth once. Repeat dose on Day 3 if persistent symptoms.  2 tablet  0  . fluticasone (FLONASE) 50 MCG/ACT nasal spray Place 2 sprays into both nostrils daily.  16 g  6  . metFORMIN (GLUCOPHAGE) 500 MG tablet Take 1 tablet (500 mg total) by mouth 2 (two) times daily with a meal.  60 tablet  3  . albuterol (PROVENTIL HFA;VENTOLIN HFA) 108 (90 BASE) MCG/ACT inhaler Inhale 2 puffs into the lungs every 4 (four) hours as needed for wheezing or shortness of breath.  1 each  1  . furosemide (LASIX) 20 MG tablet Take 1 tablet (20 mg total) by mouth daily. For shortness of breath and/or leg swelling.  30 tablet  1    Objective: BP 95/53  Pulse 85  Temp(Src)  99.1 F (37.3 C) (Oral)  Resp 30  SpO2 92%  LMP 06/09/2014 Exam: General: alert, NAD,  HEENT: PERRL, oropharynx clear, EOMI, [place]/AT, no anterior or cervical lymphadenopathy  Cardiovascular: tachycardia, regular rhythm, no murmurs, rubs or gallops  Respiratory: speaking in full sentences with no extra effort of breathing, on 4L [place] saturating mid 90's. Moving good air with no wheeze or crackles but diminished breath sounds bibasilar but L>R  Abdomen: soft, NDNT, no HSM Extremities: +2 pulses in UE and LE b/l, normal grip strength, 5/5 strength in UE/LE b/l  Skin: no rashes  Neuro: no gross deficits.   Labs and Imaging: CBC BMET   Recent Labs Lab 06/16/14 1639  WBC 6.3  HGB 14.4  HCT 44.1  PLT 270    Recent Labs Lab 06/16/14 1639  NA 138  K 4.1  CL 100  CO2 24  BUN 9  CREATININE 1.04  GLUCOSE 80  CALCIUM 9.0     Urinalysis    Component Value Date/Time   COLORURINE YELLOW 06/16/2014 2008   APPEARANCEUR CLEAR 06/16/2014 2008   LABSPEC 1.005 06/16/2014 2008   PHURINE 6.0 06/16/2014 2008   GLUCOSEU NEGATIVE 06/16/2014 2008   HGBUR LARGE* 06/16/2014 2008   HGBUR negative 05/21/2010 0912   BILIRUBINUR NEGATIVE 06/16/2014 2008   BILIRUBINUR NEGATIVE 12/01/2013 1124   KETONESUR 15* 06/16/2014 2008   PROTEINUR NEGATIVE 06/16/2014 2008   PROTEINUR 30 12/01/2013 1124   UROBILINOGEN 1.0 06/16/2014 2008   UROBILINOGEN 0.2 12/01/2013 1124   NITRITE NEGATIVE 06/16/2014 2008   NITRITE NEGATIVE 12/01/2013 1124   LEUKOCYTESUR NEGATIVE 06/16/2014 2008    CT chest  IMPRESSION: 1. No evidence of acute pulmonary embolus. 2. No improvement and mild progression of widespread abnormal pulmonary opacity since 05/26/2014. The appearance is nonspecific. Increasing lower lobe consolidation. No cavitary changes. Does this patient have a systemic disease which might explain this appearance? (possibly related to the Mild cardiomegaly and necessity of cardiac pacemaker it this age). Has bronchoscopy  been considered? 3. No pleural or pericardial effusion. 4. Mild hepatic steatosis.  EKG: paced   Myra Rude, MD 06/16/2014, 9:25 PM PGY-2, [facility] Family Medicine FPTS Intern pager: [phone], text pages welcome

## 2014-06-16 NOTE — ED Notes (Signed)
Dr. Pollina at the bedside.  

## 2014-06-16 NOTE — Patient Instructions (Signed)
We are sending you directly to the Emergency Department.  They will evaluate you for a blood clot in your lungs, causing the low oxygen saturation. This is our primary concern.  If tests are negative and your oxygen improves, and you return home, then you may pick up the prescriptions sent to the pharmacy.  Albuterol inhaler - please use 2 puffs every 4 hours as needed. Use your Symbicort inhaler every day as prescribed.  Lasix 20mg  tablets, use as needed for shortness of breath or leg swelling. This is a trial to see if improvement.  Important to schedule follow-up appointments with your Heart Doctor and your Lung Doctor for re-evaluation after leaving the hospital.  We will notify you with the appointment to see the Rheumatologist, as this may be related to an autoimmune problem like Lupus.  If your symptoms return after leaving the Emergency Department, and you have chest pain or worsening shortness of breath, please return for evaluation at that time.

## 2014-06-16 NOTE — ED Provider Notes (Signed)
Patient presented to the ER with shortness of breath. Patient referred to the emergency department by her family practice doctor to evaluate for possible PE. Patient found to be hypoxic in the office today.  Face to face Exam: HEENT - PERRLA Lungs - CTAB Heart - RRR, no M/R/G Abd - S/NT/ND Neuro - alert, oriented x3  Plan: Cardiac evaluation and CT angiography to evaluate for possible PE, other etiology for her shortness of breath.   Gilda Crease, MD 06/16/14 [phone]

## 2014-06-16 NOTE — Addendum Note (Signed)
Addended by: Gilberto Better R on: 06/16/2014 05:24 PM   Modules accepted: Orders

## 2014-06-17 DIAGNOSIS — R918 Other nonspecific abnormal finding of lung field: Secondary | ICD-10-CM

## 2014-06-17 LAB — GLUCOSE, CAPILLARY
GLUCOSE-CAPILLARY: 91 mg/dL (ref 70–99)
GLUCOSE-CAPILLARY: 206 mg/dL — AB (ref 70–99)
Glucose-Capillary: 88 mg/dL (ref 70–99)
Glucose-Capillary: 167 mg/dL — ABNORMAL HIGH (ref 70–99)

## 2014-06-17 LAB — COMPREHENSIVE METABOLIC PANEL
ALBUMIN: 2.6 g/dL — AB (ref 3.5–5.2)
ALK PHOS: 53 U/L (ref 39–117)
ALT: 8 U/L (ref 0–35)
AST: 18 U/L (ref 0–37)
Anion gap: 13 (ref 5–15)
BILIRUBIN TOTAL: 0.9 mg/dL (ref 0.3–1.2)
BUN: 9 mg/dL (ref 6–23)
CHLORIDE: 102 meq/L (ref 96–112)
CO2: 23 mEq/L (ref 19–32)
Calcium: 8.5 mg/dL (ref 8.4–10.5)
Creatinine, Ser: 1.01 mg/dL (ref 0.50–1.10)
GFR calc Af Amer: 83 mL/min — ABNORMAL LOW (ref >90)
GFR calc non Af Amer: 72 mL/min — ABNORMAL LOW (ref >90)
Glucose, Bld: 91 mg/dL (ref 70–99)
POTASSIUM: 3.9 meq/L (ref 3.7–5.3)
Sodium: 138 mEq/L (ref 137–147)
TOTAL PROTEIN: 6.7 g/dL (ref 6.0–8.3)

## 2014-06-17 LAB — EXPECTORATED SPUTUM ASSESSMENT W GRAM STAIN, RFLX TO RESP C

## 2014-06-17 LAB — EXPECTORATED SPUTUM ASSESSMENT W REFEX TO RESP CULTURE

## 2014-06-17 MED ORDER — IPRATROPIUM-ALBUTEROL 0.5-2.5 (3) MG/3ML IN SOLN
3.0000 mL | Freq: Four times a day (QID) | RESPIRATORY_TRACT | Status: DC
  Administered 2014-06-17 – 2014-06-21 (×13): 3 mL via RESPIRATORY_TRACT
  Filled 2014-06-17 (×14): qty 3

## 2014-06-17 MED ORDER — METHYLPREDNISOLONE SODIUM SUCC 40 MG IJ SOLR
40.0000 mg | Freq: Two times a day (BID) | INTRAMUSCULAR | Status: DC
  Administered 2014-06-17 – 2014-06-18 (×4): 40 mg via INTRAVENOUS
  Filled 2014-06-17 (×6): qty 1

## 2014-06-17 NOTE — H&P (Signed)
I have seen and examined this patient. I have discussed with Dr Jordan Likes.  I agree with their findings and plans as documented in their admission note.  Greatly appreciate Pulmonary Service expertise in managing this patient's new progressive infiltrative pulmonary process.

## 2014-06-17 NOTE — Consult Note (Addendum)
Name: ANDA SOBOTTA MRN: [id number] DOB: Aug 08, 1981    ADMISSION DATE:  06/16/2014 CONSULTATION DATE:  06/17/2014   REFERRING MD :  FPTS PRIMARY SERVICE:  FPTS  CHIEF COMPLAINT:  Dyspnea, pulmonary infiltrates  BRIEF PATIENT DESCRIPTION:  33 year old female admitted with progressive pulmonary infiltrates and hypoxemia. Pulmonary was consulted  SIGNIFICANT EVENTS / STUDIES:  CT scan chest 06/16/2014: Bilateral predominantly lower lobe infiltrates peribronchial in nature with some degree of nodularity and patchy bilateral groundglass opacity  LINES / TUBES: PIV  CULTURES: None  ANTIBIOTICS: None  HISTORY OF PRESENT ILLNESS:   This is a 33 year old African American female who presents with progressive dyspnea and productive cough and associated wheezing. The patient has progressive bilateral pulmonary infiltrates seen on CT scanning. The patient previously been admitted 2 weeks ago with present community acquired pneumonia viral induced. However no specific etiology was discerned. Despite diuresis and antibiotics the patient has not improved. The patient returned to the hospital on 06/16/2014 with progressive increase shortness of breath and hypoxemia. Note brain atrophic peptide was normal despite ejection fraction 35% on echocardiogram. The patient had no overt evidence of heart failure and this is presumed to be a primary pneumonic process. Pulmonary has been re\re consult at. The patient denies any history or evidence of rash, alopecia. The patient does note bilateral joint pain that is diffuse.  The patient has a history of lupus in the family but the patient herself has never been diagnosed with lupus  PAST MEDICAL HISTORY :  Past Medical History  Diagnosis Date  . Cardiac pacemaker     Since age 33. Last generator change in 2004  . Congenital complete AV block   . Obesity   . GERD (gastroesophageal reflux disease)   . Asymptomatic LV dysfunction     Echo in Dec 2011  with EF 35 to 40%. Felt to be due to paced rhythm  . Seizures     as a child- from high fever  . Anxiety   . Bipolar affective disorder   . Depression     bipolar  . Carpal tunnel syndrome of right wrist   . Asthma     seasonal allergies   . Arthritis     rheumatoid arthritis- mild, no rheumatology care   . Hypertension   . CHF (congestive heart failure)    Past Surgical History  Procedure Laterality Date  . Throat surgery  1994    s/p laser treatment  . Cesarean section    . Insert / replace / remove pacemaker      2001  . Cholecystectomy    . Iud removal  11/03/2011    Procedure: INTRAUTERINE DEVICE (IUD) REMOVAL;  Surgeon: Myra C. Marice Potter, MD;  Location: [facility] ORS;  Service: Gynecology;  Laterality: N/A;  . Cyst excision  12/10/2012    THYROID  . Thyroglossal duct cyst N/A 12/10/2012    Procedure: REVISION OF THYROGLOSSAL DUCT CYST EXCISION;  Surgeon: Serena Colonel, MD;  Location: [facility] OR;  Service: ENT;  Laterality: N/A;  Revision of Thyroglossal Duct Cyst Excision  . Nasal fracture surgery      /w plate   . Carpal tunnel with cubital tunnel Right 07/26/2013    Procedure: RIGHT LIMITED OPEN CARPAL TUNNEL RELEASE ,  RIGHT CUBITAL TUNNEL RELEASE, INSITU VERSES ULNAR NERVE DECOMPRESSION AND ANTERIOR TRANSPOSITION;  Surgeon: Dominica Severin, MD;  Location: [facility] OR;  Service: Orthopedics;  Laterality: Right;   Prior to Admission medications   Medication Sig Start Date End  Date Taking? Authorizing Provider  Azelastine-Fluticasone (DYMISTA) 137-50 MCG/ACT SUSP Place 1-2 puffs into the nose at bedtime. 04/06/14 04/06/15 Yes Waymon Budge, MD  budesonide-formoterol (SYMBICORT) 80-4.5 MCG/ACT inhaler Inhale 2 puffs into the lungs 2 (two) times daily. 2 puffs then rinse mouth, twice daily 04/06/14  Yes Waymon Budge, MD  cetirizine (ZYRTEC) 10 MG tablet Take 1 tablet (10 mg total) by mouth daily. 05/22/14  Yes Alexander Althea Charon, DO  fluconazole (DIFLUCAN) 150 MG tablet Take 1 tablet (150 mg total)  by mouth once. Repeat dose on Day 3 if persistent symptoms. 05/22/14  Yes Alexander Althea Charon, DO  fluticasone (FLONASE) 50 MCG/ACT nasal spray Place 2 sprays into both nostrils daily. 12/01/13  Yes Charm Rings, MD  metFORMIN (GLUCOPHAGE) 500 MG tablet Take 1 tablet (500 mg total) by mouth 2 (two) times daily with a meal. 01/18/14  Yes Charm Rings, MD  albuterol (PROVENTIL HFA;VENTOLIN HFA) 108 (90 BASE) MCG/ACT inhaler Inhale 2 puffs into the lungs every 4 (four) hours as needed for wheezing or shortness of breath. 06/16/14   Saralyn Pilar, DO  furosemide (LASIX) 20 MG tablet Take 1 tablet (20 mg total) by mouth daily. For shortness of breath and/or leg swelling. 06/16/14   Saralyn Pilar, DO   Allergies  Allergen Reactions  . Sertraline Hcl Hives    FAMILY HISTORY:  Family History  Problem Relation Age of Onset  . Heart disease Mother     CHF (no details)  . Hypertension Mother   . Heart disease Father     Murmur  . Heart disease Sister 77     No details.  History of a pacemaker  . Lupus Mother    SOCIAL HISTORY:  reports that she quit smoking about 17 years ago. Her smoking use included Cigarettes. She has a .125 pack-year smoking history. She has never used smokeless tobacco. She reports that she does not drink alcohol or use illicit drugs.  REVIEW OF SYSTEMS:   Constitutional: Negative for fever, chills, weight loss, malaise/fatigue and diaphoresis.  HENT: Negative for hearing loss, ear pain, nosebleeds, congestion, sore throat, neck pain, tinnitus and ear discharge.   Eyes: Negative for blurred vision, double vision, photophobia, pain, discharge and redness.  Respiratory: POS for cough, NO hemoptysis, NOTES sputum production, NOTES shortness of breath, NOTES wheezing and NO stridor.   Cardiovascular: Negative for chest pain, palpitations, orthopnea, claudication, leg swelling and PND.  Gastrointestinal: Negative for heartburn, nausea, vomiting, abdominal pain,  diarrhea, constipation, blood in stool and melena.  Genitourinary: Negative for dysuria, urgency, frequency, hematuria and flank pain.  Musculoskeletal: Negative for myalgias, back pain, joint pain and falls.  Skin: Negative for itching and rash.  Neurological: Negative for dizziness, tingling, tremors, sensory change, speech change, focal weakness, seizures, loss of consciousness, weakness and headaches.  Endo/Heme/Allergies: Negative for environmental allergies and polydipsia. Does not bruise/bleed easily.  SUBJECTIVE:  No acute distress on [place] oxygen  VITAL SIGNS: Temp:  [97.7 F (36.5 C)-99.4 F (37.4 C)] 97.7 F (36.5 C) (08/29 0558) Pulse Rate:  [64-114] 93 (08/29 0558) Resp:  [15-32] 19 (08/29 0129) BP: (88-117)/(51-82) 91/55 mmHg (08/29 0558) SpO2:  [89 %-100 %] 93 % (08/29 0558) Weight:  [99.202 kg (218 lb 11.2 oz)-99.837 kg (220 lb 1.6 oz)] 99.837 kg (220 lb 1.6 oz) (08/29 0558)  PHYSICAL EXAMINATION: General:  No distress  Neuro:  Awake and alert oriented x3 no focal deficits HEENT:  No jugular venous distention no thyromegaly neck is supple oropharynx  is clear no lymphadenopathy Cardiovascular:  Regular rate and rhythm normal S1-S2 no murmur no gallop no S3 no S4 Lungs:  Diminished breath sounds a few expired wheezes bibasilar rales Abdomen:  Soft nontender no organomegaly bowel sounds active Musculoskeletal:  Full range of motion no joint deformity seen Skin:  Clear   Recent Labs Lab 06/16/14 1639 06/17/14 0835  NA 138 138  K 4.1 3.9  CL 100 102  CO2 24 23  BUN 9 9  CREATININE 1.04 1.01  GLUCOSE 80 91    Recent Labs Lab 06/16/14 1639  HGB 14.4  HCT 44.1  WBC 6.3  PLT 270   Ct Angio Chest Pe W/cm &/or Wo Cm  06/16/2014   CLINICAL DATA:  33 year old female with shortness of breath x1 week. Hypoxia on room air. Initial encounter.  EXAM: CT ANGIOGRAPHY CHEST WITH CONTRAST  TECHNIQUE: Multidetector CT imaging of the chest was performed using the standard  protocol during bolus administration of intravenous contrast. Multiplanar CT image reconstructions and MIPs were obtained to evaluate the vascular anatomy.  CONTRAST:  OMNIPAQUE IOHEXOL 350 MG/ML SOLN  COMPARISON:  Portable chest radiographs 0520 hr the same day, Chest CTA 05/26/2014.  FINDINGS: Good contrast bolus timing in the pulmonary arterial tree. Low lung volumes. No focal filling defect identified in the pulmonary arterial tree to suggest the presence of acute pulmonary embolism.  Since 05/26/2014, no improvement and mild progression in widespread bilateral pulmonary opacity, both involving distal airways, but also peribronchovascular, and trending toward consolidation at both lung bases. No cavitation. Major airways are patent.  No pericardial or pleural effusion. Stable mild cardiomegaly. Left chest cardiac pacemaker reactive appearing mediastinal lymph nodes have not significantly changed. No axillary lymphadenopathy. No large hilar lymphadenopathy. Stable thoracic inlet with a small left level 4 node and negative visible thyroid.  Visible aorta is normal.  In the abdomen the gallbladder is surgically absent. There is evidence of hepatic steatosis. Otherwise negative visualized liver, spleen, pancreas, adrenal glands, kidneys, and bowel.  Mild butterfly vertebra at T10. Mild scoliosis. No acute osseous abnormality identified.  Review of the MIP images confirms the above findings.  IMPRESSION: 1.  No evidence of acute pulmonary embolus. 2. No improvement and mild progression of widespread abnormal pulmonary opacity since 05/26/2014. The appearance is nonspecific. Increasing lower lobe consolidation. No cavitary changes. Does this patient have a systemic disease which might explain this appearance? (possibly related to the Mild cardiomegaly and necessity of cardiac pacemaker it this age). Has bronchoscopy been considered? 3. No pleural or pericardial effusion. 4. Mild hepatic steatosis.    Electronically Signed   By: Augusto Gamble M.D.   On: 06/16/2014 19:06    ASSESSMENT / PLAN:  #1 bilateral progressive lower lobe peribronchial infiltrates with groundglass changes and nodular pattern consistent with acute pneumonitis of unclear etiology.  Note previous serologic studies show a speckled positive ANA 1-80 pattern and no evidence of allergy factors or elevated IgE.  This inflammatory pneumonitis is not yet discerned as to etiology therefore bronchoscopy would be indicated and will be pursued  Plan  Schedule bronchoscopy for 06/19/2014  Begin  Steroids and bronchodilator therapy at this time  Obtain hypersensitivity pneumonitis panel and  ACE panel  #2 history of congestive heart failure and pacemaker placement however current findings are not consistent with heart failure and in fact patient appears to be euvolemic and process on CT scan appears to be more pneumonic in nature   Dorcas Carrow Beeper  [phone]  Cell  [phone]  If no response or cell goes to voicemail, call beeper [phone]  Pulmonary and Critical Care Medicine [facility] Pager: [phone]  06/17/2014, 10:56 AM

## 2014-06-17 NOTE — Assessment & Plan Note (Signed)
Current allergy profile results would not predict large local reactions to allergy vaccine injections. Plan-Dymista

## 2014-06-17 NOTE — Assessment & Plan Note (Addendum)
Mild asthma with bronchitis.  Her symptoms don't match exam and there may be some degree of exaggeration Plan-Symbicort 80, Z-Pak

## 2014-06-17 NOTE — Progress Notes (Signed)
I discussed with  Dr Art Buff.  I agree with their plans documented in their progress note.

## 2014-06-17 NOTE — Progress Notes (Signed)
Family Medicine Teaching Service Daily Progress Note Intern Pager: [phone]  Patient name: Makayla Vasquez Medical record number: [id number] Date of birth: 01/15/1981 Age: 33 y.o. Gender: female  Primary Care Provider: Saralyn Pilar, DO Consultants: pulmonology Code Status: full  Assessment and Plan: Makayla Vasquez is a 33 y.o. female presenting with hypoxemia. PMH is significant for asthma, GERD, prediabetes, and chronic systolic heart failure and congenital complete AV block s/p pacemaker since age 10.   #Hypoxemia: unknown cause of hypoxemia at this time. Continues to require 4 L [place]. 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 No signs of infection with no leukocytosis and afebrile. Doesn't appear to be asthmatic in nature with no wheezing and did not appear to respond to nebulizer treatment. BNP within normal range and does not appear overloaded. May need bronch for diagnosis. - pulmonology consulted, appreciate their assistance - continue home medications.  - prednisone 50 mg daily  - daily weights.  - will hold off on additional lasix at this time as BNP was normal and does not appear overloaded on exam - sputum culture  - [place] to keep O2 >90   #Congenital AV block: s/p pacemaker (CRT-P) since age 13  - sees Dr. Ladona Ridgel as an outpt - will continue to monitor heart rate   #Prediabetes: A1c recently 6.3, on metformin at home  - ordered for CBG's qAC+HS with sliding scale (sensitive since insulin naive)  - hold metformin given recent contrast load - consider restarting at discharge  #Hx of HTN: Not currently being treated and BP not elevated.   #Systolic CHF: Last admission, ECHO showing a decrease in EF (25-30%, from baseline of 30-35% in 2013). At that time cardiology recommended lifestyle modifications and diet control. BNP with normal range. No crackles on exam.  - will hold lasix at this time as patient does not  appear overloaded - saline lock   #Asthma / seasonal allergies: sees Dr. Maple Hudson outpt  - continue home albuterol, Flonase, Symbicort, Dymista; loratadine sub for home Zyrtec   FEN/GI: heart healthy/carb modified.  Prophylaxis: heparin subq   Disposition: pending improvement in oxygen saturation     Subjective:  No complaints this morning. States does not feel short of breath.  Objective: Temp:  [97.7 F (36.5 C)-99.4 F (37.4 C)] 97.7 F (36.5 C) (08/29 0558) Pulse Rate:  [64-114] 93 (08/29 0558) Resp:  [15-32] 19 (08/29 0129) BP: (88-117)/(51-82) 91/55 mmHg (08/29 0558) SpO2:  [89 %-100 %] 93 % (08/29 0558) Weight:  [218 lb 11.2 oz (99.202 kg)-220 lb 1.6 oz (99.837 kg)] 220 lb 1.6 oz (99.837 kg) (08/29 0558) Physical Exam: General: NAD, laying in bed with [place] in place Cardiovascular: rrr, no murmur Respiratory: Bibasilar crackles, good air movement Extremities: no edema  Laboratory:  Recent Labs Lab 06/16/14 1639  WBC 6.3  HGB 14.4  HCT 44.1  PLT 270    Recent Labs Lab 06/16/14 1639  NA 138  K 4.1  CL 100  CO2 24  BUN 9  CREATININE 1.04  CALCIUM 9.0  GLUCOSE 80    Imaging/Diagnostic Tests: Ct Angio Chest Pe W/cm &/or Wo Cm  06/16/2014   IMPRESSION: 1.  No evidence of acute pulmonary embolus. 2. No improvement and mild progression of widespread abnormal pulmonary opacity since 05/26/2014. The appearance is nonspecific. Increasing lower lobe consolidation. No cavitary changes. Does this patient have a systemic disease which might explain this appearance? (possibly related to the Mild  cardiomegaly and necessity of cardiac pacemaker it this age). Has bronchoscopy been considered? 3. No pleural or pericardial effusion. 4. Mild hepatic steatosis.   Electronically Signed   By: Augusto Gamble M.D.   On: 06/16/2014 19:06     Glori Luis, MD 06/17/2014, 9:30 AM PGY-3, [facility] Family Medicine FPTS Intern pager: [phone], text pages welcome

## 2014-06-17 NOTE — Assessment & Plan Note (Signed)
Plan-reminded to reflux precautions, omeprazole

## 2014-06-18 LAB — GLUCOSE, CAPILLARY
GLUCOSE-CAPILLARY: 178 mg/dL — AB (ref 70–99)
GLUCOSE-CAPILLARY: 185 mg/dL — AB (ref 70–99)
Glucose-Capillary: 103 mg/dL — ABNORMAL HIGH (ref 70–99)
Glucose-Capillary: 151 mg/dL — ABNORMAL HIGH (ref 70–99)

## 2014-06-18 LAB — COMPREHENSIVE METABOLIC PANEL
ALBUMIN: 2.8 g/dL — AB (ref 3.5–5.2)
ALK PHOS: 56 U/L (ref 39–117)
ALT: 8 U/L (ref 0–35)
ANION GAP: 14 (ref 5–15)
AST: 13 U/L (ref 0–37)
BUN: 11 mg/dL (ref 6–23)
CO2: 23 mEq/L (ref 19–32)
CREATININE: 0.93 mg/dL (ref 0.50–1.10)
Calcium: 9.3 mg/dL (ref 8.4–10.5)
Chloride: 102 mEq/L (ref 96–112)
GFR calc non Af Amer: 79 mL/min — ABNORMAL LOW (ref >90)
GLUCOSE: 141 mg/dL — AB (ref 70–99)
POTASSIUM: 4.6 meq/L (ref 3.7–5.3)
Sodium: 139 mEq/L (ref 137–147)
TOTAL PROTEIN: 7.2 g/dL (ref 6.0–8.3)
Total Bilirubin: 0.4 mg/dL (ref 0.3–1.2)

## 2014-06-18 NOTE — Progress Notes (Signed)
   Name: Makayla Vasquez MRN: [id number] DOB: September 30, 1981    ADMISSION DATE:  06/16/2014 CONSULTATION DATE:  06/18/2014   REFERRING MD :  FPTS PRIMARY SERVICE:  FPTS  CHIEF COMPLAINT:  Dyspnea, pulmonary infiltrates  BRIEF PATIENT DESCRIPTION:  33 year old female admitted with progressive pulmonary infiltrates and hypoxemia. Pulmonary was consulted  SIGNIFICANT EVENTS / STUDIES:  CT scan chest 06/16/2014: Bilateral predominantly lower lobe infiltrates peribronchial in nature with some degree of nodularity and patchy bilateral groundglass opacity  LINES / TUBES: PIV  CULTURES: None  ANTIBIOTICS: None  SUBJECTIVE:  No acute distress on [place] oxygen  VITAL SIGNS: Temp:  [97.7 F (36.5 C)-98.2 F (36.8 C)] 97.7 F (36.5 C) (08/30 0546) Pulse Rate:  [62-98] 87 (08/30 0900) Resp:  [19-20] 19 (08/30 0546) BP: (93-101)/(38-62) 98/38 mmHg (08/30 0900) SpO2:  [89 %-95 %] 91 % (08/30 0900) Weight:  [99 kg (218 lb 4.1 oz)] 99 kg (218 lb 4.1 oz) (08/30 0546)  PHYSICAL EXAMINATION: General:  No distress  Neuro:  Awake and alert oriented x3 no focal deficits HEENT:  No jugular venous distention no thyromegaly neck is supple oropharynx is clear no lymphadenopathy Cardiovascular:  Regular rate and rhythm normal S1-S2 no murmur no gallop no S3 no S4 Lungs:  Clearer 8/30  Abdomen:  Soft nontender no organomegaly bowel sounds active Musculoskeletal:  Full range of motion no joint deformity seen Skin:  Clear   Recent Labs Lab 06/16/14 1639 06/17/14 0835 06/18/14 0745  NA 138 138 139  K 4.1 3.9 4.6  CL 100 102 102  CO2 24 23 23   BUN 9 9 11   CREATININE 1.04 1.01 0.93  GLUCOSE 80 91 141*    Recent Labs Lab 06/16/14 1639  HGB 14.4  HCT 44.1  WBC 6.3  PLT 270  no film 8/30  ASSESSMENT / PLAN:  #1 bilateral progressive lower lobe peribronchial infiltrates with groundglass changes and nodular pattern consistent with acute pneumonitis of unclear etiology.  Note previous  serologic studies show a speckled positive ANA 1-80 pattern and no evidence of allergy factors or elevated IgE.  This inflammatory pneumonitis is not yet discerned as to etiology therefore bronchoscopy would be indicated and will be pursued  Plan  Schedule bronchoscopy for 06/19/2014--one of my partners to   perform  Cont  Steroids and bronchodilator therapy at this time  F/u  hypersensitivity pneumonitis panel and  ACE panel  #2 history of congestive heart failure and pacemaker placement however current findings are not consistent with heart failure and in fact patient appears to be euvolemic and process on CT scan appears to be more pneumonic in nature   9/30  06/21/2014  Cell  [phone]  If no response or cell goes to voicemail, call beeper [phone]  Pulmonary and Critical Care Medicine [facility] Pager: [phone]  06/18/2014, 9:45 AM

## 2014-06-18 NOTE — Progress Notes (Signed)
I have seen and examined this patient. I have discussed with Dr Sonnenberg.  I agree with their findings and plans as documented in their progress note.    

## 2014-06-18 NOTE — Progress Notes (Signed)
Family Medicine Teaching Service Daily Progress Note Intern Pager: [phone]  Patient name: Makayla Vasquez Medical record number: [id number] Date of birth: [birth year] Age: 33 y.o. Gender: female  Primary Care Provider: Saralyn Pilar, DO Consultants: pulmonology Code Status: full  Assessment and Plan: Makayla Vasquez is a 33 y.o. female presenting with hypoxemia. PMH is significant for asthma, GERD, prediabetes, and chronic systolic heart failure and congenital complete AV block s/p pacemaker since age 46.   #Hypoxemia: unknown cause of hypoxemia at this time. Continues to require 4 L [place]. 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 No signs of infection with no leukocytosis and afebrile. Doesn't appear to be asthmatic in nature with no wheezing and did not appear to respond to nebulizer treatment. BNP within normal range and does not appear overloaded. Has been stable over night. - pulmonology consulted, BAL 06/19/14 - mucinex, flonase, astelin continued - solumedrol BID - duonebs q6 hours - daily weights.  - sputum culture, fungal Ab, hypersensitivity pnuemonitis panel, and ACE pending - [place] to keep O2 >90   #Congenital AV block: s/p pacemaker (CRT-P) since age 70  - sees Dr. Ladona Vasquez as an outpt - will continue to monitor heart rate   #Prediabetes: A1c recently 6.3, on metformin at home  - ordered for CBG's qAC+HS with sliding scale - will increase   - hold metformin given recent contrast load - consider restarting at discharge  #Hx of HTN: Not currently being treated and BP not elevated.   #Systolic CHF: Last admission, ECHO showing a decrease in EF (25-30%, from baseline of 30-35% in 2013). At that time cardiology recommended lifestyle modifications and diet control. BNP with normal range. No crackles on exam.  - will hold lasix at this time as patient does not appear overloaded - saline lock   #Asthma / seasonal  allergies: sees Dr. Maple Vasquez outpt  - continue home Flonase; loratadine sub for home Zyrtec  - duonebs added  FEN/GI: heart healthy/carb modified.  Prophylaxis: heparin subq   Disposition: pending improvement in oxygen saturation and results of BAL on 06/19/14.   Subjective:  No complaints this morning. States does not feel short of breath.  Objective: Temp:  [97.7 F (36.5 C)-98.2 F (36.8 C)] 97.7 F (36.5 C) (08/30 0546) Pulse Rate:  [62-98] 73 (08/30 0546) Resp:  [19-20] 19 (08/30 0546) BP: (93-101)/(49-62) 101/53 mmHg (08/30 0546) SpO2:  [89 %-95 %] 93 % (08/30 0546) Weight:  [218 lb 4.1 oz (99 kg)] 218 lb 4.1 oz (99 kg) (08/30 0546) Physical Exam: General: NAD, laying in bed with [place] in place Cardiovascular: rrr, no murmur Respiratory: Bibasilar crackles, good air movement, normal work of breath Extremities: no edema  Laboratory:  Recent Labs Lab 06/16/14 1639  WBC 6.3  HGB 14.4  HCT 44.1  PLT 270    Recent Labs Lab 06/16/14 1639 06/17/14 0835  NA 138 138  K 4.1 3.9  CL 100 102  CO2 24 23  BUN 9 9  CREATININE 1.04 1.01  CALCIUM 9.0 8.5  PROT  --  6.7  BILITOT  --  0.9  ALKPHOS  --  53  ALT  --  8  AST  --  18  GLUCOSE 80 91    Imaging/Diagnostic Tests: Ct Angio Chest Pe W/cm &/or Wo Cm  06/16/2014   IMPRESSION: 1.  No evidence of acute pulmonary embolus. 2. No improvement and mild progression of widespread abnormal pulmonary opacity since  05/26/2014. The appearance is nonspecific. Increasing lower lobe consolidation. No cavitary changes. Does this patient have a systemic disease which might explain this appearance? (possibly related to the Mild cardiomegaly and necessity of cardiac pacemaker it this age). Has bronchoscopy been considered? 3. No pleural or pericardial effusion. 4. Mild hepatic steatosis.   Electronically Signed   By: Makayla Vasquez M.D.   On: 06/16/2014 19:06     Makayla Luis, MD 06/18/2014, 7:22 AM PGY-3, [facility] Family  Medicine FPTS Intern pager: [phone], text pages welcome

## 2014-06-19 ENCOUNTER — Encounter (HOSPITAL_COMMUNITY)
Admission: EM | Disposition: A | Discharge status: Home / Self Care | Payer: Self-pay | Source: Home / Self Care | Attending: Family Medicine

## 2014-06-19 ENCOUNTER — Inpatient Hospital Stay (HOSPITAL_COMMUNITY): Payer: Medicare Other

## 2014-06-19 DIAGNOSIS — E669 Obesity, unspecified: Secondary | ICD-10-CM

## 2014-06-19 DIAGNOSIS — J9600 Acute respiratory failure, unspecified whether with hypoxia or hypercapnia: Principal | ICD-10-CM

## 2014-06-19 DIAGNOSIS — J849 Interstitial pulmonary disease, unspecified: Secondary | ICD-10-CM

## 2014-06-19 DIAGNOSIS — I5022 Chronic systolic (congestive) heart failure: Secondary | ICD-10-CM

## 2014-06-19 DIAGNOSIS — R7309 Other abnormal glucose: Secondary | ICD-10-CM

## 2014-06-19 DIAGNOSIS — J9601 Acute respiratory failure with hypoxia: Secondary | ICD-10-CM

## 2014-06-19 DIAGNOSIS — I10 Essential (primary) hypertension: Secondary | ICD-10-CM

## 2014-06-19 DIAGNOSIS — J8410 Pulmonary fibrosis, unspecified: Secondary | ICD-10-CM

## 2014-06-19 HISTORY — PX: VIDEO BRONCHOSCOPY: SHX5072

## 2014-06-19 LAB — CBC
HCT: 38.8 % (ref 36.0–46.0)
HEMOGLOBIN: 13 g/dL (ref 12.0–15.0)
MCH: 28.2 pg (ref 26.0–34.0)
MCHC: 33.5 g/dL (ref 30.0–36.0)
MCV: 84.2 fL (ref 78.0–100.0)
Platelets: 287 10*3/uL (ref 150–400)
RBC: 4.61 MIL/uL (ref 3.87–5.11)
RDW: 14.1 % (ref 11.5–15.5)
WBC: 11.1 10*3/uL — ABNORMAL HIGH (ref 4.0–10.5)

## 2014-06-19 LAB — BODY FLUID CELL COUNT WITH DIFFERENTIAL
EOS FL: 0 %
LYMPHS FL: 10 %
Monocyte-Macrophage-Serous Fluid: 60 % (ref 50–90)
Neutrophil Count, Fluid: 30 % — ABNORMAL HIGH (ref 0–25)
OTHER CELLS FL: 0 %
Total Nucleated Cell Count, Fluid: 761 cu mm (ref 0–1000)

## 2014-06-19 LAB — GLUCOSE, CAPILLARY
GLUCOSE-CAPILLARY: 119 mg/dL — AB (ref 70–99)
Glucose-Capillary: 155 mg/dL — ABNORMAL HIGH (ref 70–99)
Glucose-Capillary: 119 mg/dL — ABNORMAL HIGH (ref 70–99)
Glucose-Capillary: 160 mg/dL — ABNORMAL HIGH (ref 70–99)

## 2014-06-19 LAB — ANGIOTENSIN CONVERTING ENZYME: ANGIOTENSIN-CONVERTING ENZYME: 36 U/L (ref 8–52)

## 2014-06-19 LAB — BASIC METABOLIC PANEL
Anion gap: 13 (ref 5–15)
BUN: 15 mg/dL (ref 6–23)
CALCIUM: 8.9 mg/dL (ref 8.4–10.5)
CHLORIDE: 105 meq/L (ref 96–112)
CO2: 20 mEq/L (ref 19–32)
CREATININE: 0.92 mg/dL (ref 0.50–1.10)
GFR calc Af Amer: >90 mL/min (ref >90)
GFR calc non Af Amer: 80 mL/min — ABNORMAL LOW (ref >90)
Glucose, Bld: 142 mg/dL — ABNORMAL HIGH (ref 70–99)
Potassium: 4.9 mEq/L (ref 3.7–5.3)
Sodium: 138 mEq/L (ref 137–147)

## 2014-06-19 LAB — PNEUMOCYSTIS JIROVECI SMEAR BY DFA: Pneumocystis jiroveci Ag: NEGATIVE

## 2014-06-19 LAB — PROTIME-INR
INR: 1.03 (ref 0.00–1.49)
Prothrombin Time: 13.5 seconds (ref 11.6–15.2)

## 2014-06-19 SURGERY — VIDEO BRONCHOSCOPY WITHOUT FLUORO
Anesthesia: Moderate Sedation | Laterality: Bilateral

## 2014-06-19 MED ORDER — BUTAMBEN-TETRACAINE-BENZOCAINE 2-2-14 % EX AERO
1.0000 | INHALATION_SPRAY | Freq: Once | CUTANEOUS | Status: DC
  Filled 2014-06-19: qty 56

## 2014-06-19 MED ORDER — LIDOCAINE HCL 2 % EX GEL
1.0000 "application " | Freq: Once | CUTANEOUS | Status: DC
  Filled 2014-06-19: qty 5

## 2014-06-19 MED ORDER — MIDAZOLAM HCL 10 MG/2ML IJ SOLN
INTRAMUSCULAR | Status: DC | PRN
  Administered 2014-06-19 (×3): 1 mg via INTRAVENOUS

## 2014-06-19 MED ORDER — PHENYLEPHRINE HCL 0.25 % NA SOLN: 1.0000 | Freq: Four times a day (QID) | NASAL | Status: DC | PRN

## 2014-06-19 MED ORDER — LIDOCAINE HCL 1 % IJ SOLN
INTRAMUSCULAR | Status: DC | PRN
  Administered 2014-06-19: 6 mL via RESPIRATORY_TRACT

## 2014-06-19 MED ORDER — FENTANYL CITRATE 0.05 MG/ML IJ SOLN
INTRAMUSCULAR | Status: DC | PRN
  Administered 2014-06-19 (×2): 25 ug via INTRAVENOUS

## 2014-06-19 MED ORDER — FENTANYL CITRATE 0.05 MG/ML IJ SOLN
INTRAMUSCULAR | Status: AC
  Filled 2014-06-19: qty 4

## 2014-06-19 MED ORDER — PHENYLEPHRINE HCL 0.25 % NA SOLN
NASAL | Status: DC | PRN
  Administered 2014-06-19: 2 via NASAL

## 2014-06-19 MED ORDER — LIDOCAINE HCL (PF) 1 % IJ SOLN: INTRAMUSCULAR | Status: DC | PRN

## 2014-06-19 MED ORDER — MIDAZOLAM HCL 5 MG/ML IJ SOLN
INTRAMUSCULAR | Status: AC
  Filled 2014-06-19: qty 2

## 2014-06-19 MED ORDER — LIDOCAINE HCL 2 % EX GEL
CUTANEOUS | Status: DC | PRN
  Administered 2014-06-19: 2

## 2014-06-19 MED ORDER — CLONAZEPAM 0.5 MG PO TABS
0.2500 mg | ORAL_TABLET | Freq: Once | ORAL | Status: AC
  Administered 2014-06-19: 0.25 mg via ORAL
  Filled 2014-06-19: qty 1

## 2014-06-19 NOTE — Progress Notes (Signed)
Name: Makayla Vasquez MRN: [id number] DOB: 1981-03-27    ADMISSION DATE:  06/16/2014 CONSULTATION DATE:  06/19/2014   REFERRING MD :  FPTS PRIMARY SERVICE:  FPTS  CHIEF COMPLAINT:  Dyspnea, pulmonary infiltrates  BRIEF PATIENT DESCRIPTION:  33 year old female admitted with progressive pulmonary infiltrates and hypoxemia. Pulmonary was consulted  SIGNIFICANT EVENTS / STUDIES:  ECHO 8/11 - ef 35% CT scan chest 06/16/2014: Bilateral predominantly lower lobe infiltrates peribronchial in nature with some degree of nodularity and patchy bilateral groundglass opacity  LINES / TUBES: PIV  CULTURES: None  ANTIBIOTICS: None  SUBJECTIVE:  06/19/14: No acute distress on [place] oxygen. NPO confirmed. Resting in bed. Wants to have bronch and "get it over and done with"  VITAL SIGNS: Temp:  [97.5 F (36.4 C)-98.2 F (36.8 C)] 97.5 F (36.4 C) (08/31 0550) Pulse Rate:  [60-90] 60 (08/31 0550) Resp:  [18-20] 18 (08/31 0550) BP: (98-118)/(38-86) 118/86 mmHg (08/31 0550) SpO2:  [90 %-96 %] 96 % (08/31 0810) Weight:  [99.6 kg (219 lb 9.3 oz)] 99.6 kg (219 lb 9.3 oz) (08/31 0550)  PHYSICAL EXAMINATION: General:  No distress . Obese. Lying in bed Neuro:  Awake and alert oriented x3 no focal deficits HEENT:  No jugular venous distention no thyromegaly neck is supple oropharynx is clear no lymphadenopathy Cardiovascular:  Regular rate and rhythm normal S1-S2 no murmur no gallop no S3 no S4 Lungs:  Clearer 8/30 , some basal crackles + Abdomen:  Soft nontender no organomegaly bowel sounds active Musculoskeletal:  Full range of motion no joint deformity seen Skin:  Clear    PULMONARY No results found for this basename: PHART, PCO2, PCO2ART, PO2, PO2ART, HCO3, TCO2, O2SAT,  in the last 168 hours  CBC  Recent Labs Lab 06/16/14 1639 06/19/14 0730  HGB 14.4 13.0  HCT 44.1 38.8  WBC 6.3 11.1*  PLT 270 287    COAGULATION  Recent Labs Lab 06/16/14 1608 06/19/14 0730  INR 1.12 1.03     CARDIAC  No results found for this basename: TROPONINI,  in the last 168 hours  Recent Labs Lab 06/16/14 1639  PROBNP 25.5     CHEMISTRY  Recent Labs Lab 06/16/14 1639 06/17/14 0835 06/18/14 0745 06/19/14 0730  NA 138 138 139 138  K 4.1 3.9 4.6 4.9  CL 100 102 102 105  CO2 24 23 23 20   GLUCOSE 80 91 141* 142*  BUN 9 9 11 15   CREATININE 1.04 1.01 0.93 0.92  CALCIUM 9.0 8.5 9.3 8.9   Estimated Creatinine Clearance: 102.6 ml/min (by C-G formula based on Cr of 0.92).   LIVER  Recent Labs Lab 06/16/14 1608 06/17/14 0835 06/18/14 0745 06/19/14 0730  AST  --  18 13  --   ALT  --  8 8  --   ALKPHOS  --  53 56  --   BILITOT  --  0.9 0.4  --   PROT  --  6.7 7.2  --   ALBUMIN  --  2.6* 2.8*  --   INR 1.12  --   --  1.03     INFECTIOUS No results found for this basename: LATICACIDVEN, PROCALCITON,  in the last 168 hours   ENDOCRINE CBG (last 3)   Recent Labs  06/18/14 1632 06/18/14 2116 06/19/14 0552  GLUCAP 103* 151* 155*         IMAGING x48h No results found.   ASSESSMENT / PLAN:  #1 bilateral progressive lower lobe peribronchial infiltrates with groundglass  changes and nodular pattern consistent with acute pneumonitis of unclear etiology.  Note previous serologic studies show a speckled positive ANA 1-80 pattern and no evidence of allergy factors or elevated IgE.  This inflammatory pneumonitis is not yet discerned as to etiology therefore bronchoscopy would be indicated and will be pursued    - despite EF showing chronic systolic CHF - current pattern is of acute respiratory failure of pulmonary etiology - Diffuse Parenchymal Lung Disease (DPLD) or Interstitial Lung Disease (ILD) without cause (autoimmune negative, HIV negative). DDx is BOOP, AEP, Sarcoid. Currently somewhat moderate risk to withstand transbronchial biopsy. Will likeluy tolerate only BAL lavage with moderate sedation (obese and 4L [place], likely to desaturate with  sedation)   Plan  Bronch BAL to be done 06/19/2014 13.30h at [place] - NPO to remain. She has agreed. Will use Bronch BAL cellularity to generate differential. If BAL is non-diagnostic, then need to poceed with Surgical VATS lung biopsy. Patient aware  DC  Steroids till BAL complete and data obtaine  Continued bronchodilator therapy at this time  F/u  hypersensitivity pneumonitis panel and  ACE panel; stil pending     Risks of pneumothorax, hemothorax, sedation/anesthesia complications such as cardiac or respiratory arrest or hypotension, stroke and bleeding all explained. Benefits of diagnosis but limitations of non-diagnosis also explained. Patient and mom verbalized understanding and wished to proceed.    #2 history of congestive heart failure and pacemaker placement however current findings are not consistent with heart failure and in fact patient appears to be euvolemic and process on CT scan appears to be more pneumonic in nature     Dr. Kalman Shan, M.D., [facility].C.P Pulmonary and Critical Care Medicine Staff Physician [place] [facility] [place] Pulmonary and Critical Care Pager: [phone], If no answer or between  15:00h - 7:00h: call [phone]  06/19/2014 9:05 AM

## 2014-06-19 NOTE — Procedures (Signed)
Bronchoscopy Procedure Note Makayla Vasquez [id number] 1981-01-14  Procedure: Bronchoscopy Indications: Obtain specimens for culture and/or other diagnostic studies - Dx of Acute Respiratory Failure, Acute Pulmonary Infiltrates, Intersitital Lung Disease  Procedure Details Consent: Risks of procedure as well as the alternatives and risks of each were explained to the (patient/caregiver).  Consent for procedure obtained. Time Out: Verified patient identification, verified procedure, site/side was marked, verified correct patient position, special equipment/implants available, medications/allergies/relevent history reviewed, required imaging and test results available.  Performed  In preparation for procedure, patient was given 100% FiO2. Sedation: fentanyl and versed 3mg   Airway entered and the following bronchi were examined: RML.   Procedures performed: Brushings performed Bronchoscope removed.    Evaluation Hemodynamic Status: BP stable throughout; O2 sats: transiently fell during during procedure and currently acceptable Patient's Current Condition: stable Specimens:  Sent serosanguinous fluid Complications: No apparent complications Patient did tolerate procedure well.   Patient had copious returns of light brown pink returns. Airways not examined due to her hypoxemia. RML BAL done  Speciment sent for  - virus pcr  - cell count and flow cytometry  - gram stain, micro, afb, fungal and pcp - cytology   Dr. , M.D., [facility].C.P Pulmonary and Critical Care Medicine Staff Physician [facility] [place] Pulmonary and Critical Care Pager: [phone], If no answer or between  15:00h - 7:00h: call [phone]  06/19/2014 1:51 PM

## 2014-06-19 NOTE — Care Management Note (Addendum)
    Page 1 of 2   06/21/2014     3:03:50 PM CARE MANAGEMENT NOTE 06/21/2014  Patient:  Makayla Vasquez, Makayla Vasquez   Account Number:  [id number]  Date Initiated:  06/19/2014  Documentation initiated by:  [facility]  Subjective/Objective Assessment:   33 y.o. female presenting with hypoxemia. PMH is significant for asthma, GERD, prediabetes, and chronic systolic heart failure and congenital complete AV block s/p pacemaker since age 21. //Home with spouse.     Action/Plan:   - admitted to telemetry  - continue home medications.  - prednisone 50 mg daily  - daily weights.  - started Lasix 20 mg  - sputum culture  - [place] to keep O2 >90  //Access for disposition needs   Anticipated DC Date:  06/22/2014   Anticipated DC Plan:  HOME/SELF CARE      DC Planning Services  CM consult      PAC Choice  DURABLE MEDICAL EQUIPMENT   Choice offered to / List presented to:  C-1 Patient   DME arranged  OX[employer]GEN      DME agency  [facility]        Status of service:  Completed, signed off Medicare Important Message given?  YES (If response is "NO", the following Medicare IM given date fields will be blank) Date Medicare IM given:  06/19/2014 Medicare IM given by:  [facility] Date Additional Medicare IM given:  06/21/2014 Additional Medicare IM given by:  [facility] Vandermeulen  Discharge Disposition:  HOME/SELF CARE  Per UR Regulation:  Reviewed for med. necessity/level of care/duration of stay  If discussed at Long Length of Stay Meetings, dates discussed:    Comments:  06/21/14 1300 Oletta Cohn, RN. BSN. NCM [phone] SATURATION QUALIFICATIONS: (This note is used to comply with regulatory documentation for home oxygen) Patient Saturations on Room Air at Rest =85% Patient Saturations on Room Air while Ambulating =unable to complete Patient Saturations on 2 Liters of oxygen while Ambulating =83% Please briefly explain why patient needs home oxygen: patient was unable to ambulate on room  air due to sats dropping from 94% on 2L to 85% on RA at rest.  DME ordered through [facility].  Jerm[facility] of [facility] notified to deliver oxygen to pt room prior to d/c home today.  06/20/14 1430 Letcher Schweikert, RN, BSN, [employer] [phone] Bronch BAL to be done 06/20/2014 13.30h at [place] - NPO to remain. She has agreed. Will use Bronch BAL cellularity to generate differential. If BAL is non-diagnostic, then need to poceed with Surgical VATS lung biopsy  06/19/14 188 Maple Toman, RN, BSN, [place] [phone] Spoke with pt at bedside regarding DME oxygen.  Pt states that she is not on DME oxygen at home and does not need it.

## 2014-06-19 NOTE — Progress Notes (Signed)
[facility] PCP Visit - Progress Note  I have seen the patient and discussed current hospitalization with her and her family at bedside. Arisha is in overall good spirits about her hospital stay, initially she was hesitant to return to hospital when I saw her in clinic last Friday 8/28, but she understands the importance of this hospitalization and further work-up. Currently reports feeling like she is breathing better, states she tolerated the bronchoscopy well, and possibly feels even more improved afterwards. Still requiring 4L O2 via [place], comfortable without new symptoms. She understands that several tests are still pending including studies from bronchoscopy. Also aware of the possibility that she may need home O2 temporarily.  I agree with the current hospital plan outlined by the primary team of [facility] Family Medicine Teaching Service and want to thank them for their continued excellent care and efforts in caring for my patient. I will anticipate to follow-up with the patient in the clinic after discharge from hospital.  Saralyn Pilar, DO [facility] Family Medicine, PGY-2

## 2014-06-19 NOTE — Progress Notes (Signed)
Family Medicine Teaching Service Daily Progress Note Intern Pager: [phone]  Patient name: Makayla Vasquez Medical record number: [id number] Date of birth: 1981-02-05 Age: 33 y.o. Gender: female  Primary Care Provider: Saralyn Pilar, DO Consultants: pulmonology Code Status: full  Assessment and Plan: Makayla Vasquez is a 33 y.o. female presenting with hypoxemia. PMH is significant for asthma, GERD, prediabetes, and chronic systolic heart failure and congenital complete AV block s/p pacemaker since age 72.   #Hypoxemia: unknown cause of hypoxemia at this time. Continues to require 4 L [place]. During prior hospitalization she was ANA positive and felt to be some kind of inflammatory pneumonitis. No signs of infection previously, with no leukocytosis and afebrile. Doesn't appear to be asthmatic in nature with no wheezing and did not appear to respond to nebulizer treatment. BNP within normal range and does not appear overloaded. Has been stable. Mild leukocytosis today likely due to steroids. - pulmonology consulted, BAL today; follow-up recommendations after procedure - mucinex, flonase, astelin continued - solumedrol BID - duonebs q6 hours - daily weights.  - sputum culture, fungal Ab, hypersensitivity pnuemonitis panel, and ACE pending - [place] to keep O2 >90   #Congenital AV block: s/p pacemaker (CRT-P) since age 34  - sees Dr. Ladona Ridgel as an outpt - will continue to monitor heart rate   #Prediabetes: A1c recently 6.3, on metformin at home  - ordered for CBG's qAC+HS with sliding scale - hold metformin given recent contrast load - consider restarting at discharge  #Hx of HTN: Not currently being treated and BP not elevated.   #Systolic CHF: Last admission, ECHO showing a decrease in EF (25-30%, from baseline of 30-35% in 2013). At that time cardiology recommended lifestyle modifications and diet control. BNP with normal range. No crackles on exam.  - will hold lasix at this time as  patient does not appear overloaded - saline lock   #Asthma / seasonal allergies: sees Dr. Maple Hudson outpt  - continue home Flonase; loratadine sub for home Zyrtec  - duonebs added  FEN/GI: heart healthy/carb modified.  Prophylaxis: heparin subq   Disposition: Pending improvement in oxygen saturation and results of BAL today.  Subjective:  No complaints this morning. States does not feel short of breath. Breathing treatments do help her. She is aware she will be having a procedure today.  Objective: Temp:  [97.5 F (36.4 C)-98.2 F (36.8 C)] 97.5 F (36.4 C) (08/31 0550) Pulse Rate:  [60-90] 60 (08/31 0550) Resp:  [18-20] 18 (08/31 0550) BP: (98-118)/(38-86) 118/86 mmHg (08/31 0550) SpO2:  [90 %-96 %] 96 % (08/31 0810) Weight:  [219 lb 9.3 oz (99.6 kg)] 219 lb 9.3 oz (99.6 kg) (08/31 0550) Physical Exam: General: NAD, laying in bed with [place] in place Cardiovascular: rrr, no murmur Respiratory: [place] crackles L>R, good air movement, normal work of breath Extremities: no edema  Laboratory:  Recent Labs Lab 06/16/14 1639 06/19/14 0730  WBC 6.3 11.1*  HGB 14.4 13.0  HCT 44.1 38.8  PLT 270 287    Recent Labs Lab 06/17/14 0835 06/18/14 0745 06/19/14 0730  NA 138 139 138  K 3.9 4.6 4.9  CL 102 102 105  CO2 23 23 20   BUN 9 11 15   CREATININE 1.01 0.93 0.92  CALCIUM 8.5 9.3 8.9  PROT 6.7 7.2  --   BILITOT 0.9 0.4  --   ALKPHOS 53 56  --   ALT 8 8  --   AST 18 13  --   GLUCOSE 91  141* 142*    Imaging/Diagnostic Tests: Ct Angio Chest Pe W/cm &/or Wo Cm  06/16/2014   IMPRESSION: 1.  No evidence of acute pulmonary embolus. 2. No improvement and mild progression of widespread abnormal pulmonary opacity since 05/26/2014. The appearance is nonspecific. Increasing lower lobe consolidation. No cavitary changes. Does this patient have a systemic disease which might explain this appearance? (possibly related to the Mild cardiomegaly and necessity of cardiac pacemaker it this  age). Has bronchoscopy been considered? 3. No pleural or pericardial effusion. 4. Mild hepatic steatosis.   Electronically Signed   By: Augusto Gamble M.D.   On: 06/16/2014 19:06    Pincus Large, DO 06/19/2014, 8:55 AM PGY-3, [facility] FPTS Intern pager: [phone], text pages welcome

## 2014-06-19 NOTE — Progress Notes (Signed)
FMTS Attending Note  I personally saw and evaluated the patient. The plan of care was discussed with the resident team. I agree with the assessment and plan as documented by the resident.   Patient states that dyspnea if 50% improved from time of admission, still requiring O2 via [place], BAL planned for later today to help further delineate the cause of her pulmonary illness. Appreciate pulmonology input.  Donnella Sham MD

## 2014-06-20 ENCOUNTER — Telehealth: Payer: Self-pay | Admitting: *Deleted

## 2014-06-20 ENCOUNTER — Encounter (HOSPITAL_COMMUNITY): Payer: Self-pay | Admitting: Internal Medicine

## 2014-06-20 DIAGNOSIS — R7302 Impaired glucose tolerance (oral): Secondary | ICD-10-CM

## 2014-06-20 DIAGNOSIS — R0602 Shortness of breath: Secondary | ICD-10-CM

## 2014-06-20 LAB — PATHOLOGIST SMEAR REVIEW

## 2014-06-20 LAB — CULTURE, RESPIRATORY W GRAM STAIN
Culture: NORMAL
Gram Stain: NONE SEEN

## 2014-06-20 LAB — CBC
HCT: 40.3 % (ref 36.0–46.0)
HEMOGLOBIN: 12.7 g/dL (ref 12.0–15.0)
MCH: 27.4 pg (ref 26.0–34.0)
MCHC: 31.5 g/dL (ref 30.0–36.0)
MCV: 86.9 fL (ref 78.0–100.0)
PLATELETS: 242 10*3/uL (ref 150–400)
RBC: 4.64 MIL/uL (ref 3.87–5.11)
RDW: 14.4 % (ref 11.5–15.5)
WBC: 7.1 10*3/uL (ref 4.0–10.5)

## 2014-06-20 LAB — CULTURE, RESPIRATORY

## 2014-06-20 LAB — GLUCOSE, CAPILLARY
GLUCOSE-CAPILLARY: 114 mg/dL — AB (ref 70–99)
GLUCOSE-CAPILLARY: 155 mg/dL — AB (ref 70–99)
Glucose-Capillary: 78 mg/dL (ref 70–99)

## 2014-06-20 MED ORDER — PREDNISONE 50 MG PO TABS: 50.0000 mg | ORAL_TABLET | Freq: Every day | ORAL | Status: DC

## 2014-06-20 MED ORDER — PREDNISONE 50 MG PO TABS
50.0000 mg | ORAL_TABLET | Freq: Every day | ORAL | Status: DC
  Filled 2014-06-20: qty 1

## 2014-06-20 MED ORDER — PREDNISONE 50 MG PO TABS
50.0000 mg | ORAL_TABLET | Freq: Every day | ORAL | Status: DC
Start: 2014-06-21 — End: 2014-06-21
  Administered 2014-06-21: 50 mg via ORAL
  Filled 2014-06-20 (×2): qty 1

## 2014-06-20 NOTE — Progress Notes (Addendum)
Family Medicine Teaching Service Daily Progress Note Intern Pager: [phone]  Patient name: Makayla Vasquez Medical record number: [id number] Date of birth: [birth year] Age: 33 y.o. Gender: female  Primary Care Provider: Saralyn Pilar, DO Consultants: Pulmonology Code Status: Full  Assessment and Plan: Makayla Vasquez is a 33 y.o. female presenting with hypoxemia. PMH is significant for asthma, GERD, prediabetes, and chronic systolic heart failure and congenital complete AV block s/p pacemaker since age 15.   #Hypoxemia: unclear etiology.  During prior hospitalization she was ANA positive, with consideration for possible inflammatory pneumonitis. No signs of infection previously, with no leukocytosis and afebrile. Doesn't appear to be asthmatic in nature with no wheezing and did not appear to respond to nebulizer treatment. BNP within normal range and does not appear overloaded.   - pulmonology consulted, appreciate assistance - s/p BAL 9/1- Pink, HAzy 30% neutrophils - f/u AFB culture, fungal culture, legionella culture - pneumocystis smear negative - f/u fungal Ab, hypersensitivity pnuemonitis panel, RVP - ACE negative - Sputum culture negative - Consider restarting solumedrol BID pending pulm recs - duonebs q6 hours - daily weights.  - [place] to keep O2 >90   #Congenital AV block: s/p pacemaker (CRT-P) since age 24  - sees Dr. Ladona Ridgel as an outpt - will continue to monitor heart rate   #Prediabetes: A1c recently 6.3, on metformin at home  - ordered for CBG's qAC+HS with sliding scale - hold metformin given recent contrast load - consider restarting at discharge  #Hx of HTN: Not currently being treated and BP not elevated.   #Systolic CHF: Last admission, ECHO showing a decrease in EF (25-30%, from baseline of 30-35% in 2013). At that time cardiology recommended lifestyle modifications and diet control. BNP with normal range. No crackles on exam.  - will hold lasix at this time  as patient does not appear overloaded - saline lock   #Asthma / seasonal allergies: sees Dr. Maple Hudson outpt  - mucinex, flonase, astelin continued, loratadine sub for home Zyrtec - duonebs as above  FEN/GI: heart healthy/carb modified.  Prophylaxis: heparin subq   Disposition: Pending improvement in oxygen saturation and results of BAL today.  Subjective:  NAE. Bronch yesterday, tolerated well. No changes in dyspnea. No chest pain.  Objective: Temp:  [97.1 F (36.2 C)-98.6 F (37 C)] 98.6 F (37 C) (09/01 0604) Pulse Rate:  [59-98] 80 (09/01 0604) Resp:  [18-40] 20 (09/01 0604) BP: (98-147)/(43-84) 102/84 mmHg (09/01 0604) SpO2:  [74 %-100 %] 93 % (09/01 0732) Weight:  [221 lb 0.2 oz (100.25 kg)] 221 lb 0.2 oz (100.25 kg) (09/01 0604) Physical Exam: General: NAD, laying in bed with [place] in place Cardiovascular: RRR, no murmurs appreciated Respiratory: Good air movement, normal work of breath, no crackles or wheezes appreciated Abd: Obese, soft, nt, nd Extremities: no edema  Laboratory:  Recent Labs Lab 06/16/14 1639 06/19/14 0730 06/20/14 1000  WBC 6.3 11.1* 7.1  HGB 14.4 13.0 12.7  HCT 44.1 38.8 40.3  PLT 270 287 242    Recent Labs Lab 06/17/14 0835 06/18/14 0745 06/19/14 0730  NA 138 139 138  K 3.9 4.6 4.9  CL 102 102 105  CO2 23 23 20   BUN 9 11 15   CREATININE 1.01 0.93 0.92  CALCIUM 8.5 9.3 8.9  PROT 6.7 7.2  --   BILITOT 0.9 0.4  --   ALKPHOS 53 56  --   ALT 8 8  --   AST 18 13  --   GLUCOSE 91  141* 142*    Imaging/Diagnostic Tests: Ct Angio Chest Pe W/cm &/or Wo Cm  06/16/2014   IMPRESSION: 1.  No evidence of acute pulmonary embolus. 2. No improvement and mild progression of widespread abnormal pulmonary opacity since 05/26/2014. The appearance is nonspecific. Increasing lower lobe consolidation. No cavitary changes. Does this patient have a systemic disease which might explain this appearance? (possibly related to the Mild cardiomegaly and necessity  of cardiac pacemaker it this age). Has bronchoscopy been considered? 3. No pleural or pericardial effusion. 4. Mild hepatic steatosis.   Electronically Signed   By: Augusto Gamble M.D.   On: 06/16/2014 19:06    Makayla Doe, MD 06/20/2014, 1:25 PM PGY-3, [facility] FPTS Intern pager: [phone], text pages welcome

## 2014-06-20 NOTE — Progress Notes (Signed)
Video bronchoscopy with washing intervention

## 2014-06-20 NOTE — Progress Notes (Signed)
FMTS Attending Note  I personally saw and evaluated the patient. The plan of care was discussed with the resident team. I agree with the assessment and plan as documented by the resident.   Awaiting BAL results. Dyspnea mildly improved from yesterday. Discuss restarting steroids with pulmonology.  Donnella Sham M.D.

## 2014-06-20 NOTE — Telephone Encounter (Signed)
Received fax from [facility] needing a new Rx for Accu-Chek Advantage DM kit.  They need DX code; frequency of use and quantity to be dispense for each product in the kit to bill for medicare.  Derl Barrow, RN

## 2014-06-20 NOTE — Progress Notes (Signed)
Name: Makayla Vasquez MRN: [id number] DOB: [birth year]    ADMISSION DATE:  06/16/2014 CONSULTATION DATE:  06/20/2014   REFERRING MD :  FPTS PRIMARY SERVICE:  FPTS  CHIEF COMPLAINT:  Dyspnea, pulmonary infiltrates  BRIEF PATIENT DESCRIPTION:  33 year old female admitted with progressive pulmonary infiltrates and hypoxemia. Pulmonary was consulted  LINES / TUBES: PIV  CULTURES: None  ANTIBIOTICS: None   SIGNIFICANT EVENTS / STUDIES:  ECHO 8/11 - ef 35% CT scan chest 06/16/2014: Bilateral predominantly lower lobe infiltrates peribronchial in nature with some degree of nodularity and patchy bilateral groundglass opacity  06/19/14: No acute distress on [place] oxygen. NPO confirmed. Resting in bed. Wants to have bronch and "get it over and done with"   SUBJECTIVE/OVERNIGHT/INTERVAL HX 06/20/14: Prelim BAL results shows 60% Mac and 30% PMN. This is abnormal for reduction in Macs and increase in PMN. Rules out eosinophilic ILDs. DDx is aspiration, collagen vasc dz (ruled out), IPF (ruled out), acute infection (so fare negative), BOOP (BAL not consisent with), ALI with Difuse Alveolar Damage (possible)  VITAL SIGNS: Temp:  [97.1 F (36.2 C)-98.6 F (37 C)] 98 F (36.7 C) (09/01 1411) Pulse Rate:  [80-84] 80 (09/01 1411) Resp:  [20-22] 20 (09/01 1411) BP: (98-109)/(49-84) 98/49 mmHg (09/01 1411) SpO2:  [90 %-97 %] 95 % (09/01 1411) Weight:  [100.25 kg (221 lb 0.2 oz)] 100.25 kg (221 lb 0.2 oz) (09/01 0604)  PHYSICAL EXAMINATION: General:  No distress . Obese. Lying in bed Neuro:  Awake and alert oriented x3 no focal deficits HEENT:  No jugular venous distention no thyromegaly neck is supple oropharynx is clear no lymphadenopathy Cardiovascular:  Regular rate and rhythm normal S1-S2 no murmur no gallop no S3 no S4 Lungs:  Clearer 8/30 , some basal crackles + Abdomen:  Soft nontender no organomegaly bowel sounds active Musculoskeletal:  Full range of motion no joint deformity  seen Skin:  Clear    PULMONARY No results found for this basename: PHART, PCO2, PCO2ART, PO2, PO2ART, HCO3, TCO2, O2SAT,  in the last 168 hours  CBC  Recent Labs Lab 06/16/14 1639 06/19/14 0730 06/20/14 1000  HGB 14.4 13.0 12.7  HCT 44.1 38.8 40.3  WBC 6.3 11.1* 7.1  PLT 270 287 242    COAGULATION  Recent Labs Lab 06/16/14 1608 06/19/14 0730  INR 1.12 1.03    CARDIAC  No results found for this basename: TROPONINI,  in the last 168 hours  Recent Labs Lab 06/16/14 1639  PROBNP 25.5     CHEMISTRY  Recent Labs Lab 06/16/14 1639 06/17/14 0835 06/18/14 0745 06/19/14 0730  NA 138 138 139 138  K 4.1 3.9 4.6 4.9  CL 100 102 102 105  CO2 24 23 23 20   GLUCOSE 80 91 141* 142*  BUN 9 9 11 15   CREATININE 1.04 1.01 0.93 0.92  CALCIUM 9.0 8.5 9.3 8.9   Estimated Creatinine Clearance: 103 ml/min (by C-G formula based on Cr of 0.92).   LIVER  Recent Labs Lab 06/16/14 1608 06/17/14 0835 06/18/14 0745 06/19/14 0730  AST  --  18 13  --   ALT  --  8 8  --   ALKPHOS  --  53 56  --   BILITOT  --  0.9 0.4  --   PROT  --  6.7 7.2  --   ALBUMIN  --  2.6* 2.8*  --   INR 1.12  --   --  1.03     INFECTIOUS No results found  for this basename: LATICACIDVEN, PROCALCITON,  in the last 168 hours   ENDOCRINE CBG (last 3)   Recent Labs  06/19/14 2101 06/20/14 0607 06/20/14 1122  GLUCAP 119* 114* 155*         IMAGING x48h No results found.   ASSESSMENT / PLAN:  #1 bilateral progressive lower lobe peribronchial infiltrates with groundglass changes and nodular pattern consistent with acute pneumonitis of unclear etiology.  Note previous serologic studies show a speckled positive ANA 1-80 pattern and no evidence of allergy factors or elevated IgE.  This inflammatory pneumonitis is not yet discerned as to etiology therefore bronchoscopy would be indicated and will be pursued    - Prelim BAL results shows 60% Mac and 30% PMN. This is abnormal for  reduction in Macs and increase in PMN. Rules out eosinophilic ILDs. DDx is aspiration, collagen vasc dz (ruled out), IPF (ruled out), acute infection (so fare negative), BOOP (BAL not consisent with), ALI with Difuse Alveolar Damage (possible)    Plan  Discussed surgical lung bx as definitie gold standard but she has chf issues and so moderate risk for complications. ALternative is empiric steroids for a disease that might not respond to steroids (responds to time) and risk of steroids. She prefers empiric steroid approach despite myriad risk of steroids. She might need this for months/weeks. Will start with prednisone at 39m per day   Will see her again 06/21/14 and then consider dc 9/2/5    #2 history of congestive heart failure and pacemaker placement however current findings are not consistent with heart failure and in fact patient appears to be euvolemic and process on CT scan appears to be more pneumonic in nature     Dr. MBrand Males M.D., FRehabilitation Hospital Of Indiana IncC.P Pulmonary and Critical Care Medicine Staff Physician CSour JohnPulmonary and Critical Care Pager: [phone] If no answer or between  15:00h - 7:00h: call [phone]  06/20/2014 2:41 PM

## 2014-06-21 ENCOUNTER — Other Ambulatory Visit: Payer: Self-pay | Admitting: Family Medicine

## 2014-06-21 DIAGNOSIS — R7302 Impaired glucose tolerance (oral): Secondary | ICD-10-CM

## 2014-06-21 DIAGNOSIS — Q246 Congenital heart block: Secondary | ICD-10-CM

## 2014-06-21 LAB — CBC
HCT: 43.2 % (ref 36.0–46.0)
Hemoglobin: 13.7 g/dL (ref 12.0–15.0)
MCH: 27.5 pg (ref 26.0–34.0)
MCHC: 31.7 g/dL (ref 30.0–36.0)
MCV: 86.6 fL (ref 78.0–100.0)
PLATELETS: 240 10*3/uL (ref 150–400)
RBC: 4.99 MIL/uL (ref 3.87–5.11)
RDW: 14.8 % (ref 11.5–15.5)
WBC: 5.9 10*3/uL (ref 4.0–10.5)

## 2014-06-21 LAB — BASIC METABOLIC PANEL
ANION GAP: 12 (ref 5–15)
BUN: 14 mg/dL (ref 6–23)
CALCIUM: 8.7 mg/dL (ref 8.4–10.5)
CO2: 22 mEq/L (ref 19–32)
CREATININE: 1.08 mg/dL (ref 0.50–1.10)
Chloride: 105 mEq/L (ref 96–112)
GFR calc Af Amer: 77 mL/min — ABNORMAL LOW (ref >90)
GFR, EST NON AFRICAN AMERICAN: 66 mL/min — AB (ref >90)
Glucose, Bld: 145 mg/dL — ABNORMAL HIGH (ref 70–99)
Potassium: 4 mEq/L (ref 3.7–5.3)
Sodium: 139 mEq/L (ref 137–147)

## 2014-06-21 LAB — RESPIRATORY VIRUS PANEL
Adenovirus: NOT DETECTED
INFLUENZA A H1: NOT DETECTED
Influenza A H3: NOT DETECTED
Influenza A: NOT DETECTED
Influenza B: NOT DETECTED
Metapneumovirus: NOT DETECTED
Parainfluenza 1: NOT DETECTED
Parainfluenza 2: NOT DETECTED
Parainfluenza 3: NOT DETECTED
Respiratory Syncytial Virus A: NOT DETECTED
Respiratory Syncytial Virus B: NOT DETECTED
Rhinovirus: NOT DETECTED

## 2014-06-21 LAB — GLUCOSE, CAPILLARY
GLUCOSE-CAPILLARY: 100 mg/dL — AB (ref 70–99)
Glucose-Capillary: 112 mg/dL — ABNORMAL HIGH (ref 70–99)
Glucose-Capillary: 170 mg/dL — ABNORMAL HIGH (ref 70–99)

## 2014-06-21 LAB — CULTURE, BAL-QUANTITATIVE W GRAM STAIN: Colony Count: 50000

## 2014-06-21 LAB — CULTURE, BAL-QUANTITATIVE

## 2014-06-21 MED ORDER — ACCU-CHEK NANO SMARTVIEW W/DEVICE KIT: PACK | Status: DC

## 2014-06-21 MED ORDER — GLUCOSE BLOOD VI STRP: ORAL_STRIP | Status: DC

## 2014-06-21 MED ORDER — ACCU-CHEK SOFT TOUCH LANCETS MISC: Status: DC

## 2014-06-21 MED ORDER — ACCU-CHEK ADVANTAGE DIABETES KIT: PACK | Status: DC

## 2014-06-21 MED ORDER — IPRATROPIUM-ALBUTEROL 0.5-2.5 (3) MG/3ML IN SOLN: 3.0000 mL | RESPIRATORY_TRACT | Status: DC | PRN

## 2014-06-21 MED ORDER — PREDNISONE 50 MG PO TABS: 50.0000 mg | ORAL_TABLET | Freq: Every day | ORAL | Status: DC

## 2014-06-21 NOTE — Telephone Encounter (Signed)
[facility]ed [facility], and sent new electronic rx for Accu-Chek Advantage DM Kit (glucometer), Lancets (Accu-Chek Soft touch) dispense 100 each, and Test strips 100 each. 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 Rx re-sent as pharmacy was unable to locate these rx.  Nobie Putnam, [place], PGY-2

## 2014-06-21 NOTE — Progress Notes (Signed)
Family Medicine Teaching Service Daily Progress Note Intern Pager: [phone]  Patient name: Makayla Vasquez Medical record number: [id number] Date of birth: 09-28-1981 Age: 33 y.o. Gender: female  Primary Care Provider: Saralyn Pilar, DO Consultants: Pulmonology Code Status: Full  Assessment and Plan: Makayla Vasquez is a 33yoF with a PMH of systolic CHF and complete congenital AV block s/p pacemaker placement at age 47, asthma, GERD and prediabetes who presents with hypoxemia of unknown etiology. She was hospitalized from 8/11-8/14 with a similar presentation and was thought to have a viral pneumonitis complicated by atelectasis and mild edema vs. an autoimmune process and was discharged home on a course of levaquin and prednisone.   #Hypoxemia: Patient presents with hypoxemia of unknown etiology. CT scan (8/28) showed bilateral progressive lower peribronchial infiltrates with groundglass changes and nodular patter consistent with acute pneumonitis of unclear etiology. She was recently found to be ANA positive (1:80), she has a family history of lupus (mother and father), a 2 month history of migratory myalgias, and 20lb weight loss in the past month. Notably, she also No signs of infection or indication of asthmatic or cardiac etiology. BAL on 9/1 showed 60% mac and 30% PMNs, consistent with DAD. Ddx includes acute lupus pneumonitis, hypersensitivity pneumonitis, aspiration, acute infection, ALI with DAD. 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  - Pulmonary consulted, recommends lung bx vs. empiric steroid treatment. Patient prefers empiric steroids. - Will treat with pred 50 qd with titration to 40qd in a couple weeks at pulmonary clinic. - Discharge with home OS and continued steroids. - f/u anti-Sm and anti-dsDNA Ab to further assess for lupus - f/u AFB, fungal, legionella cultures; AFB and fungal smears negative, legionella Ab  negative - f/u BAL culture; gram stain negative  - f/u fungal Ab, hypersensitivity pnuemonitis panel, RVP  - Pneumocystis smear negative  - ACE negative  - Sputum culture negative  - duonebs q6 hours  - daily weights.  - [place] to keep O2 >90   #Hypotension: Patient had an episode of hypotension to 67/40 at 6am repeat measurement of 88/48. Asymptomatic.  - Continue to monitor, ensure normal before discharge   #Congenital AV block: s/p pacemaker (CRT-P) since age 63  - sees Dr. Ladona Ridgel as an outpt  - will continue to monitor heart rate   #Prediabetes: A1c recently 6.3, on metformin at home  - ordered for CBG's qAC+HS with sliding scale  - hold metformin given recent contrast load - restart at discharge   #Hx of HTN: Not currently being treated and BP not elevated.   #Systolic CHF: Last admission, ECHO showing a decrease in EF (25-30%, from baseline of 30-35% in 2013). At that time cardiology recommended lifestyle modifications and diet control. BNP with normal range. No crackles on exam.  - will hold lasix at this time as patient does not appear overloaded  - saline lock   #Asthma / seasonal allergies: sees Dr. Maple Hudson outpt  - mucinex, flonase, astelin continued, loratadine sub for home Zyrtec  - duonebs as above   FEN/GI: heart healthy/carb modified.  Prophylaxis: heparin subq   Disposition: Discharge home today.  Subjective:  Denies shortness of breath, on 4L. Able to ambulate without SOB unless she walks fast. Would like to try empiric steroids and not go forward with lung bx. Reports a history of a 20lb weight loss in the past month, two months of intermittent myalgias. Reports myalgias this morning. Denies arthralgias, mucus membrane involvement,  facial rashes.   Objective: Temp:  [98 F (36.7 C)-98.3 F (36.8 C)] 98.3 F (36.8 C) (09/02 0609) Pulse Rate:  [73-101] 80 (09/02 0609) Resp:  [18-20] 18 (09/02 0609) BP: (67-108)/(40-61) 88/48 mmHg (09/02 0645) SpO2:  [90 %-98  %] 98 % (09/02 0609) FiO2 (%):  [36 %] 36 % (09/01 1519) Weight:  [223 lb 5.2 oz (101.3 kg)] 223 lb 5.2 oz (101.3 kg) (09/02 6712)  Physical Exam: General: NAD, lying in bed napping.  Cardiovascular: RRR, normal s1s2. No murmurs, rubs, gallops.  Respiratory: CTAB. No wheezes, crackles, rhonchi.  Abdomen: Soft, nontender, nondistended. +bs Extremities: No LE edema   Laboratory:  Recent Labs Lab 06/16/14 1639 06/19/14 0730 06/20/14 1000  WBC 6.3 11.1* 7.1  HGB 14.4 13.0 12.7  HCT 44.1 38.8 40.3  PLT 270 287 242    Recent Labs Lab 06/17/14 0835 06/18/14 0745 06/19/14 0730  NA 138 139 138  K 3.9 4.6 4.9  CL 102 102 105  CO2 23 23 20   BUN 9 11 15   CREATININE 1.01 0.93 0.92  CALCIUM 8.5 9.3 8.9  PROT 6.7 7.2  --   BILITOT 0.9 0.4  --   ALKPHOS 53 56  --   ALT 8 8  --   AST 18 13  --   GLUCOSE 91 141* 142*    Imaging/Diagnostic Tests:  Ct Angio Chest Pe W/cm &/or Wo Cm  06/16/2014 IMPRESSION: 1. No evidence of acute pulmonary embolus. 2. No improvement and mild progression of widespread abnormal pulmonary opacity since 05/26/2014. The appearance is nonspecific. Increasing lower lobe consolidation. No cavitary changes. Does this patient have a systemic disease which might explain this appearance? (possibly related to the Mild cardiomegaly and necessity of cardiac pacemaker it this age). Has bronchoscopy been considered? 3. No pleural or pericardial effusion. 4. Mild hepatic steatosis. Electronically Signed By: 06/18/2014 M.D. On: 06/16/2014 19:06    Augusto Gamble, Med Student 06/21/2014, 7:15 AM Acting Intern,  Family Medicine FPTS Intern pager: [phone], text pages welcome  I have seen and evaluated the above patient and agree with the note (physical exam is below).  Discharge home today with PO steroids per Pulm and home O2.   Exam: General: well appearing, NAD. Cardiovascular: RRR. No murmurs, rubs, or gallops. Respiratory: CTAB. No rales, rhonchi, or  wheeze. Abdomen: soft, nontender, nondistended. Extremities: No LE edema.  08/21/2014 DO Family Medicine PGY-3

## 2014-06-21 NOTE — Progress Notes (Signed)
SATURATION QUALIFICATIONS: (This note is used to comply with regulatory documentation for home oxygen)  Patient Saturations on Room Air at Rest =85%  Patient Saturations on Room Air while Ambulating =unable to complete  Patient Saturations on 2 Liters of oxygen while Ambulating =83%  Please briefly explain why patient needs home oxygen: patient was unable to ambulate on room air due to sats dropping from 94% on 2L to 85% on RA at rest.

## 2014-06-21 NOTE — Progress Notes (Signed)
Name: Makayla Vasquez MRN: [id number] DOB: July 01, 1981    ADMISSION DATE:  06/16/2014 CONSULTATION DATE:  06/21/2014   REFERRING MD :  FPTS PRIMARY SERVICE:  FPTS  CHIEF COMPLAINT:  Dyspnea, pulmonary infiltrates  BRIEF PATIENT DESCRIPTION:  33 year old female admitted with progressive pulmonary infiltrates and hypoxemia. Pulmonary was consulted  LINES / TUBES: PIV  CULTURES: None  ANTIBIOTICS: None   SIGNIFICANT EVENTS / STUDIES:  ECHO 8/11 - ef 35% CT scan chest 06/16/2014: Bilateral predominantly lower lobe infiltrates peribronchial in nature with some degree of nodularity and patchy bilateral groundglass opacity  06/19/14: No acute distress on [place] oxygen. NPO confirmed. Resting in bed. Wants to have bronch and "get it over and done with"   06/20/14: Prelim BAL results shows 60% Mac and 30% PMN. This is abnormal for reduction in Macs and increase in PMN. Rules out eosinophilic ILDs. DDx is aspiration, collagen vasc dz (ruled out), IPF (ruled out), acute infection (so fare negative), BOOP (BAL not consisent with), ALI with Difuse Alveolar Damage (possible)    SUBJECTIVE/OVERNIGHT/INTERVAL HX 06/21/14: Wants to go home. Prefers empiric prednisone over surgical lung bx. Still on o2.Denies active complaints  VITAL SIGNS: Temp:  [98 F (36.7 C)-98.3 F (36.8 C)] 98.3 F (36.8 C) (09/02 0609) Pulse Rate:  [73-101] 80 (09/02 0609) Resp:  [18-20] 18 (09/02 0609) BP: (67-108)/(40-61) 88/48 mmHg (09/02 0645) SpO2:  [90 %-98 %] 96 % (09/02 0953) FiO2 (%):  [36 %] 36 % (09/01 1519) Weight:  [101.3 kg (223 lb 5.2 oz)] 101.3 kg (223 lb 5.2 oz) (09/02 0609)  PHYSICAL EXAMINATION: General:  No distress . Obese. Lying in bed Neuro:  Awake and alert oriented x3 no focal deficits HEENT:  No jugular venous distention no thyromegaly neck is supple oropharynx is clear no lymphadenopathy Cardiovascular:  Regular rate and rhythm normal S1-S2 no murmur no gallop no S3 no S4 Lungs:  Clearer  8/30 , some basal crackles + Abdomen:  Soft nontender no organomegaly bowel sounds active Musculoskeletal:  Full range of motion no joint deformity seen Skin:  Clear    PULMONARY No results found for this basename: PHART, PCO2, PCO2ART, PO2, PO2ART, HCO3, TCO2, O2SAT,  in the last 168 hours  CBC  Recent Labs Lab 06/19/14 0730 06/20/14 1000 06/21/14 0859  HGB 13.0 12.7 13.7  HCT 38.8 40.3 43.2  WBC 11.1* 7.1 5.9  PLT 287 242 240    COAGULATION  Recent Labs Lab 06/16/14 1608 06/19/14 0730  INR 1.12 1.03    CARDIAC  No results found for this basename: TROPONINI,  in the last 168 hours  Recent Labs Lab 06/16/14 1639  PROBNP 25.5     CHEMISTRY  Recent Labs Lab 06/16/14 1639 06/17/14 0835 06/18/14 0745 06/19/14 0730 06/21/14 0859  NA 138 138 139 138 139  K 4.1 3.9 4.6 4.9 4.0  CL 100 102 102 105 105  CO2 24 23 23 20 22   GLUCOSE 80 91 141* 142* 145*  BUN 9 9 11 15 14   CREATININE 1.04 1.01 0.93 0.92 1.08  CALCIUM 9.0 8.5 9.3 8.9 8.7   Estimated Creatinine Clearance: 88.2 ml/min (by C-G formula based on Cr of 1.08).   LIVER  Recent Labs Lab 06/16/14 1608 06/17/14 0835 06/18/14 0745 06/19/14 0730  AST  --  18 13  --   ALT  --  8 8  --   ALKPHOS  --  53 56  --   BILITOT  --  0.9 0.4  --  PROT  --  6.7 7.2  --   ALBUMIN  --  2.6* 2.8*  --   INR 1.12  --   --  1.03     INFECTIOUS No results found for this basename: LATICACIDVEN, PROCALCITON,  in the last 168 hours   ENDOCRINE CBG (last 3)   Recent Labs  06/20/14 1618 06/20/14 2106 06/21/14 0649  GLUCAP 78 100* 112*         IMAGING x48h No results found.   ASSESSMENT / PLAN:  #1 bilateral progressive lower lobe peribronchial infiltrates with groundglass changes and nodular pattern consistent with acute pneumonitis of unclear etiology.  Note previous serologic studies show a speckled positive ANA 1-80 pattern and no evidence of allergy factors or elevated IgE.  This  inflammatory pneumonitis is not yet discerned as to etiology therefore bronchoscopy would be indicated and will be pursued    - Prelim BAL results shows 60% Mac and 30% PMN. This is abnormal for reduction in Macs and increase in PMN. Rules out eosinophilic ILDs. DDx is aspiration, collagen vasc dz (ruled out), IPF (ruled out), acute infection (so fare negative), BOOP (BAL not consisent with), ALI with Difuse Alveolar Damage (possible)    - On 9.2/15: No change. 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     Plan  Start with prednisone at 50mg  per day; down titration to 40mg  per day in  A week or two at pulmonary clinic   Dc home from [facility] perspective on pred and o2   OPD followup with NP Tammy PArreet 06/27/14 Tuesday a 4pm [facility] -> after this with Dr 08/27/14 (allergist) her primary pulm doc or Dr Monday for ILD specific issues; will be decided in clinic     #2 history of congestive heart failure and pacemaker placement however current findings are not consistent with heart failure and in fact patient appears to be euvolemic and process on CT scan appears to be more pneumonic in nature     Dr. Maple Hudson, M.D., [facility].C.P Pulmonary and Critical Care Medicine Staff Physician [facility] [place] Pager: [phone], If no answer or between  15:00h - 7:00h: call [phone]  06/21/2014 10:07 AM

## 2014-06-21 NOTE — Discharge Summary (Signed)
I agree with the discharge summary as documented.   Kyle Fletke MD  

## 2014-06-21 NOTE — Progress Notes (Signed)
Discharged instructions completed. Medication orders reviewed. Patient has oxygen for home, and will be transferring home with family.

## 2014-06-21 NOTE — Discharge Instructions (Signed)
You were admitted for hypoxia and shortness of breath.  Etiology remained unclear throughout your hospitalization. You will be discharged on prednisone 50 mg daily with close follow up with pulmonology.  You will also be discharged on home O2. Please follow up as indicated.

## 2014-06-21 NOTE — Discharge Summary (Signed)
[facility] Discharge Summary  Patient name: Makayla Vasquez Medical record number: [id number] Date of birth: Mar 24, 1981 Age: 33 y.o. Gender: female Date of Admission: 06/16/2014  Date of Discharge: 06/21/2014 Admitting Physician: Blane Ohara McDiarmid, MD  Primary Care Provider: Nobie Putnam, DO Consultants: Renal   Indication for Hospitalization: Hypoxemia of unknown etiology.   Discharge Diagnoses/Problem List:  Hypoxemia of unknown etiology Congenital AV block Systolic CHF Prediabetes HTN Asthma  Disposition: Discharged to home.  Discharge Condition: Stable   Discharge Exam:  General: Well-appearing woman lying in bed with [place] in place. No acute distress.  Cardiovascular: RRR. Normal s1s2. No murmurs, rubs, or gallops.  Respiratory: CTAB. No rales, rhonchi, or wheezes. Normal work of breathing.  Abdomen: Soft, nontender, nondistended. +bs.   Extremities: No LE edema. Skim warm and dry without rashes.   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  Hypoxemia of unknown etiology: Patient presented with hypoxemia of unknown etiology, requiring 4L of oxygen. Notably, she was hospitalized from 8/11-8/14 with a similar presentation and was thought to have a viral pneumonitis complicated by atelectasis and mild edema vs. an autoimmune process and was discharged home on a course of levaquin and prednisone. Her CT scan from 8/28 showed bilateral progressive lower peribronchial infiltrates with groundglass changes and nodular patter consistent with acute pneumonitis of unclear etiology. During her last admission, she was found to have a positive ANA, mildly elevated ESR, non-reactive HIV, and negative PPD. Also during her last hospitalization, cardiology saw her for a repeat  echo that showed a decrease in EF (25-30%, from baseline 30-25% in 2013). Per cardiology, in the setting of mildly decreased EF and normal BNP, the etiology of her symptoms was likely pulmonary. This admission, she does not have evidence of infection, asthma exacerbation, or cardiac etiology of her hypoxemia.  Pulmonology was consulted and did a BAL on 9/1 that showed 60% macrophages and 30% PMNs. Thus far, her AFB smear, fungal smear, legionella Ab, pneumocystis smear, ACE, and sputum culture are negative. Her AFB, legionella, fungal, and BAL culture as well as her fungal Ab, hypersensitivity pneumonitis panel, and RVP are pending. Per pulmonology, the differential includes aspiration, acute infection, BOOP (though BAL not consistent with this), and ALI with DAD. Also of note, her family history is notable for lupus in both her parents and she does report a two month history of intermittent migratory myalgias and weight loss (20lbs).  She denies malar rash, arthralgias, alopecia, or mucosal lesions. Though unlikely, acute lupus pneumonitis remains on the differential. Anti-smith and anti-dsDNA labs are pending. She was offered a lung biopsy for further diagnosis versus empiric treatment with steroids. 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 She was discharged on prednisone 55m and home oxygen, which she will take until her follow-up appointment with pulmonology on 06/27/14.   Congenital AV block: S/p pacemaker (CRT-P) since age 33 Sees Dr. TLovena Leas an an outpatient. - Normal rate throughout hospitalization.   Prediabetes: A1c recently 6.3, given sliding scale. Re-started on home metformin at discharge.   Hx of HTN: Not treated during hospitalization as BP not elevated.   Systolic CHF: At last admission, echo showed a decreased  in EF (25-30%, from baseline of 30-35% in 2013). At that time,  cardiology recommended lifestyle modifications and diet control. BNP wnl. No evidence of volume overload, thus lasix held during admission. Re-started lasix upon discharge.   Asthma / seasonal allergies: Sees Dr. Annamaria Boots as an outpatient. Continued home mucinex, flonase, astelin. Loratadine was substituted for home Zyrtec.  Issues for Follow Up:  1) Follow up CBG's, Diabetic control in setting of steroid 2) Follow up SOB/O2 requirement 3) Ensure close follow up with Pulmology 4) Follow up pending results (see below)  Significant Procedures: BAL on 9/1.   Significant Labs and Imaging:   CT angiogram (8/28) IMPRESSION:  1. No evidence of acute pulmonary embolus.  2. No improvement and mild progression of widespread abnormal  pulmonary opacity since 05/26/2014. The appearance is nonspecific.  Increasing lower lobe consolidation. No cavitary changes.  Does this patient have a systemic disease which might explain this  appearance? (possibly related to the Mild cardiomegaly and necessity  of cardiac pacemaker it this age). Has bronchoscopy been considered?  3. No pleural or pericardial effusion.  4. Mild hepatic steatosis.    Recent Labs Lab 06/19/14 0730 06/20/14 1000 06/21/14 0859  WBC 11.1* 7.1 5.9  HGB 13.0 12.7 13.7  HCT 38.8 40.3 43.2  PLT 287 242 240    Recent Labs Lab 06/16/14 1639 06/17/14 0835 06/18/14 0745 06/19/14 0730 06/21/14 0859  NA 138 138 139 138 139  K 4.1 3.9 4.6 4.9 4.0  CL 100 102 102 105 105  CO2 24 23 23 20 22   GLUCOSE 80 91 141* 142* 145*  BUN 9 9 11 15 14   CREATININE 1.04 1.01 0.93 0.92 1.08  CALCIUM 9.0 8.5 9.3 8.9 8.7  ALKPHOS  --  53 56  --   --   AST  --  18 13  --   --   ALT  --  8 8  --   --   ALBUMIN  --  2.6* 2.8*  --   --     Results/Tests Pending at Time of Discharge:  -AFB culture -Legionella culture -Fungal culture -BAL culture -Fungal Ab -Hypersensitivity pneumonitis panel -RVP - Anti Smith and Anti DS DNA  ab  Discharge Medications:    Medication List    STOP taking these medications       fluconazole 150 MG tablet  Commonly known as:  DIFLUCAN     fluticasone 50 MCG/ACT nasal spray  Commonly known as:  FLONASE      TAKE these medications       ACCU-CHEK ADVANTAGE DIABETES kit  Check blood sugar once daily as needed for monitoring.     accu-chek soft touch lancets  Check blood sugar once daily as needed.     albuterol 108 (90 BASE) MCG/ACT inhaler  Commonly known as:  PROVENTIL HFA;VENTOLIN HFA  Inhale 2 puffs into the lungs every 4 (four) hours as needed for wheezing or shortness of breath.     Azelastine-Fluticasone 137-50 MCG/ACT Susp  Commonly known as:  DYMISTA  Place 1-2 puffs into the nose at bedtime.     budesonide-formoterol 80-4.5 MCG/ACT inhaler  Commonly known as:  SYMBICORT  Inhale 2 puffs into the lungs 2 (two) times daily. 2 puffs then rinse mouth, twice daily     cetirizine 10 MG tablet  Commonly known as:  ZYRTEC  Take 1 tablet (10 mg total) by mouth daily.     furosemide 20 MG tablet  Commonly known as:  LASIX  Take 1 tablet (20 mg total) by mouth daily. For shortness of breath and/or leg swelling.     metFORMIN 500 MG tablet  Commonly known as:  GLUCOPHAGE  Take 1 tablet (500 mg total) by mouth 2 (two) times daily with a meal.     predniSONE 50 MG tablet  Commonly known as:  DELTASONE  Take 1 tablet (50 mg total) by mouth daily.      ASK your doctor about these medications       glucose blood test strip  Commonly known as:  ACCU-CHEK ACTIVE STRIPS  Check blood sugar once daily as needed.       Discharge Instructions: Please refer to Patient Instructions section of EMR for full details.  Patient was counseled important signs and symptoms that should prompt return to medical care, changes in medications, dietary instructions, activity restrictions, and follow up appointments.   Follow-Up Appointments: Follow-up Information   Follow up  with Nobie Putnam, DO On 06/28/2014. (930 am)    Specialty:  Osteopathic Medicine   Contact information:   [address] [place] [id number] [phone]       Follow up with PARRETT,TAMMY, NP On 06/27/2014. (4 PM)    Specialty:  Nurse Practitioner   Contact information:   [address] [place] [address] [phone]       Gabriel Rung Acting Intern, [facility]  I have seen and evaluated the above patient and agree with the note in its entirety.  [place] PGY-3

## 2014-06-21 NOTE — Progress Notes (Signed)
Dr. Kalman Shan, M.D., [facility].C.P Pulmonary and Critical Care Medicine Staff Physician [facility] [place] Pulmonary and Critical Care Pager: [phone], If no answer or between  15:00h - 7:00h: call [phone]  06/21/2014 7:43 AM

## 2014-06-21 NOTE — Progress Notes (Signed)
FMTS Attending Note  I personally saw and evaluated the patient. The plan of care was discussed with the resident team. I agree with the assessment and plan as documented by the resident.   Dyspnea continues to improved, Pulm recs discharge with steroid taper, agree with resident note to check anti-smith and anti-double stranded DNA given elevated ANA to further evaluate for Lupus.   Patient to have home O2 evaluation. Slightly hypotensive overnight however asymptomatic. Otherwise stable for discharge later today as long as she has normal BP prior to discharge.  Donnella Sham MD

## 2014-06-22 LAB — ANTI-SMITH ANTIBODY: ENA SM AB SER-ACNC: NEGATIVE

## 2014-06-22 LAB — ANTI-DNA ANTIBODY, DOUBLE-STRANDED: ds DNA Ab: <1 IU/mL

## 2014-06-23 ENCOUNTER — Other Ambulatory Visit: Payer: Self-pay | Admitting: Emergency Medicine

## 2014-06-23 MED ORDER — FLUTICASONE PROPIONATE 50 MCG/ACT NA SUSP: 1.0000 | Freq: Every day | NASAL | Status: DC

## 2014-06-23 MED ORDER — AZELASTINE HCL 0.1 % NA SOLN: 1.0000 | Freq: Every day | NASAL | Status: DC

## 2014-06-25 LAB — LEGIONELLA PROFILE(CULTURE+DFA/SMEAR): Legionella Antigen (DFA): NEGATIVE

## 2014-06-26 LAB — HYPERSENSITIVITY PNUEMONITIS PROFILE

## 2014-06-26 LAB — FUNGAL ANTIBODIES PANEL, ID-BLOOD
ASPERGILLUS FUMIGATUS: NEGATIVE
Aspergillus Flavus Antibodies: NEGATIVE
Aspergillus Niger Antibodies: NEGATIVE
BLASTOMYCES ABS, QN, DID: NEGATIVE
Coccidioides Antibody ID: NEGATIVE
Histoplasma Ab, Immunodiffusion: NEGATIVE

## 2014-06-27 ENCOUNTER — Other Ambulatory Visit (INDEPENDENT_AMBULATORY_CARE_PROVIDER_SITE_OTHER): Payer: Medicare Other

## 2014-06-27 ENCOUNTER — Ambulatory Visit (INDEPENDENT_AMBULATORY_CARE_PROVIDER_SITE_OTHER): Payer: Medicare Other | Admitting: Adult Health

## 2014-06-27 ENCOUNTER — Encounter: Payer: Self-pay | Admitting: Adult Health

## 2014-06-27 ENCOUNTER — Ambulatory Visit (INDEPENDENT_AMBULATORY_CARE_PROVIDER_SITE_OTHER)
Admission: RE | Admit: 2014-06-27 | Discharge: 2014-06-27 | Disposition: A | Discharge status: Home / Self Care | Payer: Medicare Other | Source: Ambulatory Visit | Attending: Adult Health | Admitting: Adult Health

## 2014-06-27 VITALS — BP 114/80 | HR 110 | Temp 98.3°F | Ht 66.0 in | Wt 223.8 lb

## 2014-06-27 DIAGNOSIS — R0600 Dyspnea, unspecified: Secondary | ICD-10-CM

## 2014-06-27 DIAGNOSIS — J8410 Pulmonary fibrosis, unspecified: Secondary | ICD-10-CM

## 2014-06-27 DIAGNOSIS — J849 Interstitial pulmonary disease, unspecified: Secondary | ICD-10-CM

## 2014-06-27 DIAGNOSIS — R0989 Other specified symptoms and signs involving the circulatory and respiratory systems: Secondary | ICD-10-CM

## 2014-06-27 DIAGNOSIS — E119 Type 2 diabetes mellitus without complications: Secondary | ICD-10-CM | POA: Diagnosis not present

## 2014-06-27 DIAGNOSIS — R0609 Other forms of dyspnea: Secondary | ICD-10-CM | POA: Diagnosis not present

## 2014-06-27 DIAGNOSIS — I442 Atrioventricular block, complete: Secondary | ICD-10-CM | POA: Diagnosis not present

## 2014-06-27 LAB — SEDIMENTATION RATE: Sed Rate: 55 mm/hr — ABNORMAL HIGH (ref 0–22)

## 2014-06-27 MED ORDER — PREDNISONE 20 MG PO TABS: 40.0000 mg | ORAL_TABLET | Freq: Every day | ORAL | Status: DC

## 2014-06-27 NOTE — Patient Instructions (Signed)
Taper Prednisone 40mg  daily and hold at this dose.  Continue on Oxygen 4l/m continuous flow 24/7 .  Labs and chest xray today .  Follow up Dr.  In 2 weeks and As needed   Please contact office for sooner follow up if symptoms do not improve or worsen or seek emergency care

## 2014-06-28 ENCOUNTER — Encounter: Payer: Self-pay | Admitting: Family Medicine

## 2014-06-28 ENCOUNTER — Ambulatory Visit (INDEPENDENT_AMBULATORY_CARE_PROVIDER_SITE_OTHER): Payer: Medicare Other | Admitting: Family Medicine

## 2014-06-28 VITALS — BP 131/88 | HR 106 | Temp 98.1°F | Wt 223.0 lb

## 2014-06-28 DIAGNOSIS — J9600 Acute respiratory failure, unspecified whether with hypoxia or hypercapnia: Secondary | ICD-10-CM

## 2014-06-28 DIAGNOSIS — F3175 Bipolar disorder, in partial remission, most recent episode depressed: Secondary | ICD-10-CM | POA: Diagnosis not present

## 2014-06-28 DIAGNOSIS — J9601 Acute respiratory failure with hypoxia: Secondary | ICD-10-CM

## 2014-06-28 DIAGNOSIS — J8410 Pulmonary fibrosis, unspecified: Secondary | ICD-10-CM | POA: Diagnosis not present

## 2014-06-28 DIAGNOSIS — IMO0002 Reserved for concepts with insufficient information to code with codable children: Secondary | ICD-10-CM | POA: Diagnosis not present

## 2014-06-28 DIAGNOSIS — J849 Interstitial pulmonary disease, unspecified: Secondary | ICD-10-CM

## 2014-06-28 MED ORDER — LAMOTRIGINE 100 MG PO TABS: 100.0000 mg | ORAL_TABLET | Freq: Every day | ORAL | Status: DC

## 2014-06-28 NOTE — Progress Notes (Signed)
   Subjective:    Patient ID: Makayla Vasquez, female    DOB: Sep 01, 1981, 33 y.o.   MRN: [id number]  HPI  Hospital Follow-up 06/16/14 to 06/21/14 for dyspnea and persistent hypoxia. S/p Bronchoscopy on 06/19/14  SOB / Hypoxia: - Reports feeling improved since discharge, admits to still experiencing some improved "shortness of breath" with increased activity, but improves with rest. - Followed up at Pulmonology yesterday 06/27/14, was advised to stay on 4L oxygen 24 hours daily, had labs and CXR completed, scheduled for 2 week follow-up - Still taking Prednisone 50mg  x 2 days, then start Prednisone 40mg  daily until next appointment, advised to to take Lasix 20mg  daily as well to "remove fluid from lungs" - Denies any chest pain or tightness, cough, fevers/chills, leg swelling, orthopnea  Bipolar, mixed / Anxiety / Depression: - Reports history of Bipolar disorder, previously treated with a few different medicines, most recently on Lamictal with good results, since discontinued about 1-2 years ago due to "not needing it anymore" - Currently with increased anxiety and thoughts of depression due to recent health problems with oxygen and hospitalizations. - Requesting something for anxiety, and wants to "avoid going into depression again" - Admits to mood swings, emotional, with crying spells - Denies suicidal or homicidal ideation  I have reviewed and updated the following as appropriate: allergies and current medications  Social Hx: Former smoker  Review of Systems  See above HPI    Objective:   Physical Exam  BP 131/88  Pulse 106  Temp(Src) 98.1 F (36.7 C) (Oral)  Wt 223 lb (101.152 kg)  SpO2 91%  LMP 06/27/2014  Gen - obese, well-appearing, cooperative, NAD HEENT - on [place] with 4L portable O2 Heart - tachycardic, regular rhythm, no murmurs heard Lungs - Mostly CTAB with some bibasilar rhonchi, no wheezing. Normal work of breathing. Speaks in full sentences. Ext - non-tender, no  edema, peripheral pulses intact +2 b/l Skin - warm, dry, no rashes Neuro - awake, alert, oriented Psych - normal speech content and rate, appropriate thoughts and insight into condition     Assessment & Plan:   See specific A&P problem list for details.

## 2014-06-28 NOTE — Patient Instructions (Addendum)
Dear Makayla Vasquez, Thank you for coming in to clinic today. It was good to see you!  Today we discussed your Breathing and Oxygen level, and Anxiety / Bipolar. 1. For your breathing, it looks like most of your testing has been negative, no signs of infection and this is most likely caused by "Acute Lung Injury" we do not have exact cause at this point. 2. Continue Prednisone 40mg  daily (may finish 50mg  daily for next few days), continue this until you follow-up with Lung doctor 3. Continue 4 L oxygen 24 hours daily until you follow-up with lung doctor. Your O2 level came up to 91% today. Important to take breaks after activity to rest. 4. For your anxiety, it seems consistent with prior history of Bipolar disorder. Sent prescription for Lamictal 100mg  tablets - start taking 1 tablet daily for 1 week, then you may increase to 2 tablets or 200mg  daily for maintenance. This is the same medication you were on previously. Important to be aware of any rash that you develop over the next few weeks, as it can develop into a severe drug reaction.  If you develop any painful, red, spreading or concerning rash please schedule close follow-up to return to our clinic for evaluation.  Please schedule a "[facility] - [facility]" appointment with Dr. and Dr. here at [facility] - in 1 month to evaluate your current symptoms. If you cannot schedule this appointment then please schedule a follow-up appointment with me (Dr. ) in 1 month for re-evaluation of Anxiety / Bipolar.  If you have any other questions or concerns, please feel free to call the clinic to contact me. You may also schedule an earlier appointment if necessary.  However, if your symptoms get significantly worse, please go to the Emergency Department to seek immediate medical attention.  , DO [facility]   Stevens-Johnson Syndrome Stevens-Johnson syndrome (SJS) is a rare,  serious skin and mucous membrane disorder. SJS can involve the oral, nasal, eye, vaginal, urethral, gastrointestinal, and lower respiratory tract mucous membranes. This disorder eventually causes the top layer of skin to die and shed. This is an emergency medical condition usually needing hospitalization. The more skin that is lost, the more serious and life-threatening the disorder becomes. CAUSES  SJS is usually caused by an allergic reaction to medicine. Medicines such as antibiotics (sulfa, penicillin) and antiseizure medicines (phenytoin) have been prescribed to more than  of all patients with SJS. Other medicines known to cause SJS include medicine used for gout (allopurinol), cocaine, other antiseizure medicines (carbamazepine, barbiturates), and nonsteroidal anti-inflammatory drugs (NSAIDS, such as ibuprofen and naproxen). Other causes of SJS are viral infections (mycoplasma), bacterial infections, and rarely cancer. Sometimes the cause is unknown. SYMPTOMS SJS often begins with several days of flu-like symptoms, such as:  Fever.  Sore throat.  Cough.  Burning eyes. After the first several days, your mucous membranes become inflamed. A painful red or purplish rash develops on the face and trunk, spreads, and creates blisters over your skin. Other common signs of SJS include:  Swelling in the face.  Hives.  Skin pain.  Joint aches.  Sensitivity to light (photophobia).  Difficulty eating or swallowing due to pain in the mouth and throat.  Difficulty urinating.  Low blood pressure and fast heart rate.  Nosebleeds and sore mucous membranes.  Blisters on your skin and mucous membranes, especially in your mouth, nose, and eyes.  Shedding of your skin.  Tongue swelling.  DIAGNOSIS  Your caregiver can often recognize SJS from your medical history, a physical exam, and your symptoms.  To confirm the diagnosis, your caregiver may take a tissue sample (biopsy) from your skin to  be examined under a microscope.  Blood tests and other general lab work may also be done. TREATMENT Treatment focuses on getting rid of the cause, if possible, and minimizing or preventing complications. If the cause of SJS can be removed and the skin reaction can be stopped, your skin may begin to grow again within several days. In severe cases, full recovery may take weeks to months. Prompt treatment usually shortens the length of the illness.  SJS requires hospitalization, often in an intensive care unit or burn unit.  Any medicines that could be causing SJS are stopped.  Pain medicines will be given as needed for discomfort.  Antibiotics are given for infection, if needed.  Immunoglobulin may be given by vein to try to stop the disease process.  Skin loss can result in large losses of fluid from your body. These fluids must be replaced. You may receive fluids and nutrients through a tube placed through your nose and into your stomach (nasogastric tube).  Blisters that have not broken may be left to heal on their own. Your caregiver may gently remove dead skin and put a bandage (dressing) on those areas.  Skin grafting may be used on large areas, but this is rarely needed. PREVENTION Preventing a recurrence of SJS is critical. A recurrence is usually more severe than the first episode and can be life-threatening.  If your SJS was caused by a medicine, be sure to avoid that medicine and others in the same drug class. This information will be listed in your medical chart.  If herpes virus caused your reaction, you may need to take daily antiviral medicines to prevent a recurrence. HOME CARE INSTRUCTIONS  Know what caused your reaction. If a medicine caused your reaction, learn the name of that medicine and any other related medicines that could cause a recurrence.  Let all your caregivers know about your history of SJS. If the reaction was caused by a medicine, provide your caregivers  with the name of that medicine.  Wear a medical bracelet or necklace that says you had SJS and what caused it. SEEK IMMEDIATE MEDICAL CARE IF:  You cough up thick saliva mixed with mucus (sputum).  You have a headache.  You feel very tired.  You have joint aches and pain. Document Released: 06/19/2011 Document Revised: 12/29/2011 Document Reviewed: 06/19/2011 [facility] Patient Information 2015 [place], [place]. This information is not intended to replace advice given to you by your health care provider. Make sure you discuss any questions you have with your health care provider.

## 2014-06-28 NOTE — Assessment & Plan Note (Signed)
Currently stable on 4L supplemental O2, improved since hospitalization, still with persistent O2 req - SpO2 91% today at rest - Chart review recent hospitalization: s/p bronchoscopy 06/19/14 - negative bacterial, acid-fast, fungal, and viral cultures. Additional labs with negative ds-DNA / anti-Smith (unlikely SLE), ACE level normal (unlikely pulm sarcoidosis). 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 Ramaswamy  Plan: 1. Continue 4L O2, 24 hrs daily and Prednisone 40mg daily x 2 weeks until Pulmonology follow-up 2. May continue Lasix 20mg PO daily per Pulmonology for potential pulm edema, despite evidence of fluid overload on exam, suspected less likely CHF 3. Advised regular rest without over-exertion 4. Follow-up with Pulm 2 weeks 

## 2014-06-28 NOTE — Assessment & Plan Note (Signed)
Currently stable on 4L supplemental O2, improved since hospitalization, still with persistent O2 req - SpO2 91% today at rest - Chart review recent hospitalization: s/p bronchoscopy 06/19/14 - negative bacterial, acid-fast, fungal, and viral cultures. Additional labs with negative ds-DNA / anti-Smith (unlikely SLE), ACE level normal (unlikely pulm sarcoidosis). 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 Marchelle Gearing  Plan: 1. Continue 4L O2, 24 hrs daily and Prednisone 40mg  daily x 2 weeks until Pulmonology follow-up 2. May continue Lasix 20mg  PO daily per Pulmonology for potential pulm edema, despite evidence of fluid overload on exam, suspected less likely CHF 3. Advised regular rest without over-exertion 4. Follow-up with Pulm 2 weeks

## 2014-06-28 NOTE — Assessment & Plan Note (Signed)
Consistent with increased mixed anxiety and depressive thoughts, without manic behavior - Hx Bipolar disorder previously diagnosed, previously managed on Lamictal - Suspected triggered / worsening symptoms due to acute medical illness with recurrent hospitalizations, inc stressors  Plan: 1. Resume Lamictal, start 100mg  daily x 1 week, titrate up to 200mg  daily if tolerated - Cautioned on risk of rash with Stevens-Johnson Syndrome, previously well tolerated without hx of reaction, seems less likely on repeat course 2. Recommended schedule follow-up with [facility] at [facility], discussed case with Dr. , who agrees patient is appropriate for this follow-up 3. RTC for MDC or f/u 1 month to evaluate tolerating Lamictal

## 2014-06-30 NOTE — Progress Notes (Signed)
02/23/14- 51 yoF former smoker Referred by Dr Bridgett Larsson for allergy consult.  C/o itchy, watery eyes, sinus congestion, headaches, prodcough with green mucous X several years. Mother and son are here She had been going to allergist at [facility] for allergic rhinitis, itching eyes and chest tightness for years as an adult. Typically worse in spring and summer, with occasional colds and with exposure to house dust. Daily wheeze.She tried allergy shots for 2 months, 2 years ago but had strong local reactions. 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 Mother has hx allergies. CXR 5/ 8 /14 IMPRESSION:  No active disease. No significant change.  Original Report Authenticated By: Lahoma Crocker, M.D.  04/06/14- 69 yoF former smoker Referred by Dr Bridgett Larsson for allergy consult.  C/o itchy, watery eyes, sinus congestion, headaches, prodcough with green mucous X several years. Mother and son are here FOLLOWS FOR: Allergies have been doing well until outdoors with trees,etc. Allergy profile 02/23/2014-negative with total IgE 22.9. She does give history of allergy shots in the past, stopped because of large local reactions. Past history suggests stronger allergic reactions when she was younger. We can retest her if there is a question She still reports some itching in her throat and cough sometimes green sputum  06/27/14 [facility] follow up  Returns for follow up from recent hospital stay.  Reports breathing is doing well today. Is suppose to be on O2 at 4lm however did not bring O2 b/c   portable tank at home is malfunctioned. DME contacted and at home to fix system. Advised on importance of O2 use.  Says were very low off Oxygen - sats 54%-83% on room air unpon entering the exam room. Sats increased 90% on 4l/m  Admitted last week for significant hypoxia   Patient presented with hypoxemia of unknown etiology, requiring 4L of oxygen. Notably, she was hospitalized from 8/11-8/14 with a  similar presentation and was thought to have a viral pneumonitis complicated by atelectasis and mild edema vs. an autoimmune process and was discharged home on a course of levaquin and prednisone.  Her CT scan from 8/28 showed bilateral progressive lower peribronchial infiltrates with groundglass changes and nodular patter consistent with acute pneumonitis of unclear etiology. During her last admission, she was found to have a positive ANA, mildly elevated ESR, non-reactive HIV, and negative PPD. Also during her last hospitalization, cardiology saw her for a repeat echo that showed a decrease in EF (25-30%, from baseline 30-25% in 2013).  Viral panel was positive for rhinovirus previous admission but neg for this admit .  ESR was elevated 62 , decreased today at 55.  Pulmonology was consulted and did a BAL on 9/1 that showed 60% macrophages and 30% PMNs. Thus far, her AFB smear, fungal smear, legionella Ab, pneumocystis smear, ACE, and sputum culture are negative. Fungal neg , bal cx neg w/ non path oral flora , legionella neg  Has family hx of Lupus.  Anti smith ab neg and anti dsDNA neg  HSP panel was neg.  Concern for ALI with diffuse alveoloar damage    ROS-see HPI Constitutional:   No-   weight loss, night sweats, fevers, chills, fatigue, lassitude. HEENT:   , difficulty swallowing, tooth/dental problems, sore throat,       +sneezing, +itching, ear ache, +nasal congestion, post nasal drip,  CV:  No-   chest pain, orthopnea, PND, swelling in lower extremities, anasarca,  dizziness, palpitations Resp: No-   shortness of breath with exertion or at rest.              +productive cough,  No non-productive cough,  No- coughing up of blood.               No- wheezing.   Skin: No-   rash or lesions. GI:  + heartburn, +indigestion, abdominal pain, nausea, vomiting,  GU:  MS:  No-   joint pain or swelling.   Neuro-     nothing unusual Psych:  No- change in mood or  affect. No depression or anxiety.  No memory loss.  OBJ- Physical Exam General- Alert, Oriented, Affect-appropriate, Distress- none acute,  Skin- rash-none, lesions- none, excoriation- none Lymphadenopathy- none Head- atraumatic            Eyes- Gross vision intact, PERRLA, conjunctivae and secretions clear            Ears- Hearing, canals-normal            Nose- +, no-Septal dev, mucus, polyps, erosion, perforation             Throat- Mallampati III , mucosa clear , drainage- none, tonsils- atrophic Neck- flexible , trachea midline, no stridor , thyroid nl, carotid no bruit Chest - symmetrical excursion , unlabored           Heart/CV- RRR , no murmur , no gallop  , no rub, nl s1 s2                           - JVD- none , edema- none, stasis changes- none, varices- none           Lung- clear to P&A, wheeze- none, cough+ light , dullness-none, rub- none           Chest wall-  Abd-  Br/ Gen/ Rectal- Not done, not indicated Extrem- cyanosis- none, clubbing, none, atrophy- none, strength- nl Neuro- grossly intact to observation  CXR 9/8 >Extensive bilateral airspace disease appears worsened since the most recent plain films compatible with persistent pulmonary edema and/or  pneumonia.

## 2014-06-30 NOTE — Progress Notes (Signed)
Quick Note:  Called spoke with patient, advised of lab results / recs as stated by TP. Pt verbalized her understanding and denied any questions. ______ 

## 2014-06-30 NOTE — Assessment & Plan Note (Addendum)
#  1 bilateral progressive lower lobe peribronchial infiltrates with groundglass changes and nodular pattern consistent with acute pneumonitis of unclear etiology.  Note previous serologic studies show a speckled positive ANA 1-80 pattern and no evidence of allergy factors or elevated IgE. This inflammatory pneumonitis is not yet discerned as to etiology therefore bronchoscopy would be indicated and will be pursued Previous viral panel + for rhinovirus  BAL cx neg to date.  HSP neg .  Will cont on steroid challenge with close follow up  Case discussed with Dr. Maple Hudson   Clinically she appears stable , will have her back in office with follow up cxr in 1 week as cxr w/ worse aeration today   Plan  Taper Prednisone 40mg  daily and hold at this dose.  Continue on Oxygen 4l/m continuous flow 24/7 .  Labs and chest xray today .  Follow up Dr.  In 1  weeks and As needed   Please contact office for sooner follow up if symptoms do not improve or worsen or seek emergency care

## 2014-07-06 ENCOUNTER — Ambulatory Visit: Payer: Medicare Other | Admitting: Internal Medicine

## 2014-07-06 ENCOUNTER — Other Ambulatory Visit (INDEPENDENT_AMBULATORY_CARE_PROVIDER_SITE_OTHER): Payer: Medicare Other

## 2014-07-06 ENCOUNTER — Encounter: Payer: Self-pay | Admitting: Internal Medicine

## 2014-07-06 ENCOUNTER — Ambulatory Visit (INDEPENDENT_AMBULATORY_CARE_PROVIDER_SITE_OTHER)
Admission: RE | Admit: 2014-07-06 | Discharge: 2014-07-06 | Disposition: A | Discharge status: Home / Self Care | Payer: Medicare Other | Source: Ambulatory Visit | Attending: Internal Medicine | Admitting: Internal Medicine

## 2014-07-06 VITALS — BP 104/84 | HR 118 | Ht 66.0 in | Wt 218.0 lb

## 2014-07-06 DIAGNOSIS — I509 Heart failure, unspecified: Secondary | ICD-10-CM | POA: Diagnosis not present

## 2014-07-06 DIAGNOSIS — J679 Hypersensitivity pneumonitis due to unspecified organic dust: Secondary | ICD-10-CM

## 2014-07-06 DIAGNOSIS — R0902 Hypoxemia: Secondary | ICD-10-CM

## 2014-07-06 DIAGNOSIS — R0989 Other specified symptoms and signs involving the circulatory and respiratory systems: Secondary | ICD-10-CM | POA: Diagnosis not present

## 2014-07-06 DIAGNOSIS — I5030 Unspecified diastolic (congestive) heart failure: Secondary | ICD-10-CM

## 2014-07-06 DIAGNOSIS — R0600 Dyspnea, unspecified: Secondary | ICD-10-CM

## 2014-07-06 DIAGNOSIS — J9819 Other pulmonary collapse: Secondary | ICD-10-CM | POA: Diagnosis not present

## 2014-07-06 DIAGNOSIS — R0609 Other forms of dyspnea: Secondary | ICD-10-CM | POA: Diagnosis not present

## 2014-07-06 LAB — BASIC METABOLIC PANEL
BUN: 18 mg/dL (ref 6–23)
CO2: 28 meq/L (ref 19–32)
CREATININE: 1.1 mg/dL (ref 0.4–1.2)
Calcium: 9.3 mg/dL (ref 8.4–10.5)
Chloride: 103 mEq/L (ref 96–112)
GFR: 71.57 mL/min (ref >60.00)
Glucose, Bld: 177 mg/dL — ABNORMAL HIGH (ref 70–99)
POTASSIUM: 4.1 meq/L (ref 3.5–5.1)
Sodium: 138 mEq/L (ref 135–145)

## 2014-07-06 LAB — BRAIN NATRIURETIC PEPTIDE: PRO B NATRI PEPTIDE: 11 pg/mL (ref 0.0–100.0)

## 2014-07-06 NOTE — Progress Notes (Signed)
02/23/14- 36 yoF former smoker Referred by Dr Bridgett Larsson for allergy consult.  C/o itchy, watery eyes, sinus congestion, headaches, prodcough with green mucous X several years. Mother and son are here She had been going to allergist at [facility] for allergic rhinitis, itching eyes and chest tightness for years as an adult. Typically worse in spring and summer, with occasional colds and with exposure to house dust. Daily wheeze.She tried allergy shots for 2 months, 2 years ago but had strong local reactions. 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 Mother has hx allergies. CXR 5/ 8 /14 IMPRESSION:  No active disease. No significant change.  Original Report Authenticated By: Lahoma Crocker, M.D.  04/06/14- 67 yoF former smoker Referred by Dr Bridgett Larsson for allergy consult.  C/o itchy, watery eyes, sinus congestion, headaches, prodcough with green mucous X several years. Mother and son are here FOLLOWS FOR: Allergies have been doing well until outdoors with trees,etc. Allergy profile 02/23/2014-negative with total IgE 22.9. She does give history of allergy shots in the past, stopped because of large local reactions. Past history suggests stronger allergic reactions when she was younger. We can retest her if there is a question She still reports some itching in her throat and cough sometimes green sputum  06/27/14 [facility] follow up  Returns for follow up from recent hospital stay.  Reports breathing is doing well today. Is suppose to be on O2 at 4lm however did not bring O2 b/c   portable tank at home is malfunctioned. DME contacted and at home to fix system. Advised on importance of O2 use.  Says were very low off Oxygen - sats 54%-83% on room air unpon entering the exam room. Sats increased 90% on 4l/m  Admitted last week for significant hypoxia   Patient presented with hypoxemia of unknown etiology, requiring 4L of oxygen. Notably, she was hospitalized from 8/11-8/14 with a  similar presentation and was thought to have a viral pneumonitis complicated by atelectasis and mild edema vs. an autoimmune process and was discharged home on a course of levaquin and prednisone.  Her CT scan from 8/28 showed bilateral progressive lower peribronchial infiltrates with groundglass changes and nodular patter consistent with acute pneumonitis of unclear etiology. During her last admission, she was found to have a positive ANA, mildly elevated ESR, non-reactive HIV, and negative PPD. Also during her last hospitalization, cardiology saw her for a repeat echo that showed a decrease in EF (25-30%, from baseline 30-25% in 2013).  Viral panel was positive for rhinovirus previous admission but neg for this admit .  ESR was elevated 62 , decreased today at 55.  Pulmonology was consulted and did a BAL on 9/1 that showed 60% macrophages and 30% PMNs. Thus far, her AFB smear, fungal smear, legionella Ab, pneumocystis smear, ACE, and sputum culture are negative. Fungal neg , bal cx neg w/ non path oral flora , legionella neg  Has family hx of Lupus.  Anti smith ab neg and anti dsDNA neg  HSP panel was neg.  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 Status: Edited Related Problem: ILD (interstitial lung disease)    #1 bilateral progressive lower lobe peribronchial infiltrates with groundglass changes and nodular pattern consistent with acute pneumonitis of unclear etiology.  Note previous serologic studies show a speckled positive ANA 1-80 pattern and no evidence of  allergy factors or elevated IgE. This inflammatory pneumonitis is not yet discerned as to etiology therefore bronchoscopy would be indicated and will be pursued  Previous viral panel + for rhinovirus  BAL cx neg to date.  HSP neg .  Will cont on steroid challenge with close  follow up  Case discussed with Dr. Annamaria Boots  Clinically she appears stable , will have her back in office with follow up cxr in 1 week as cxr w/ worse aeration today  Plan  Taper Prednisone $RemoveBeforeDE'40mg'[id number]$  daily and hold at this dose.  Continue on Oxygen 4l/m continuous flow 24/7 .  Labs and chest xray today .  Follow up Dr. Annamaria Boots In 1 weeks and As needed  Please contact office for sooner follow up if symptoms do not improve or worsen or seek emergency care     07/06/14-32 yoF former smoker originaly referred for allergy evaluation, rhinitis, asthmatic bronchitis, then hosp for ?ALI with lower zone pneumonitis/ BAL and special studies NEG,. FOLLOWS FOR: Pt c/o DOE, prod cough with clear mucus and chest tightness when coughing. Pt last saw TP on 06/27/2014 for HFU .    ROS-see HPI Constitutional:   No-   weight loss, night sweats, fevers, chills, fatigue, lassitude. HEENT:   , difficulty swallowing, tooth/dental problems, sore throat,       +sneezing, +itching, ear ache, +nasal congestion, post nasal drip,  CV:  No-   chest pain, orthopnea, PND, swelling in lower extremities, anasarca,                                  dizziness, palpitations Resp: No-   shortness of breath with exertion or at rest.              +productive cough,  No non-productive cough,  No- coughing up of blood.               No- wheezing.   Skin: No-   rash or lesions. GI:  + heartburn, +indigestion, abdominal pain, nausea, vomiting,  GU:  MS:  No-   joint pain or swelling.   Neuro-     nothing unusual Psych:  No- change in mood or affect. No depression or anxiety.  No memory loss.  OBJ- Physical Exam General- Alert, Oriented, Affect-appropriate, Distress- none acute,  Skin- rash-none, lesions- none, excoriation- none Lymphadenopathy- none Head- atraumatic            Eyes- Gross vision intact, PERRLA, conjunctivae and secretions clear            Ears- Hearing, canals-normal            Nose- +, no-Septal dev, mucus, polyps,  erosion, perforation             Throat- Mallampati III , mucosa clear , drainage- none, tonsils- atrophic Neck- flexible , trachea midline, no stridor , thyroid nl, carotid no bruit Chest - symmetrical excursion , unlabored           Heart/CV- RRR , no murmur , no gallop  , no rub, nl s1 s2                           - JVD- none , edema- none, stasis changes- none, varices- none           Lung- clear to P&A, wheeze- none, cough+ light , dullness-none, rub- none  Chest wall-  Abd-  Br/ Gen/ Rectal- Not done, not indicated Extrem- cyanosis- none, clubbing, none, atrophy- none, strength- nl Neuro- grossly intact to observation

## 2014-07-06 NOTE — Patient Instructions (Signed)
Order- DME [facility]- add humidifier to home concentrator    Dx hypoxemia  Order- CXR   Hypoxemia, diastolic CHF  Order- lab- BMET, BNP :    Dx diastolic CHF,                   Allergy profile, hypersensitivity pneumonitis panel :  Dx hypersensitivity penumonia  Reduce prednisone to 30 mg(1 and a half tabs) x 7 days, then to 20 mg (1 tab) daily

## 2014-07-06 NOTE — Progress Notes (Signed)
Quick Note:  Had called pt on 9/15 and scheduled appt for today 9/17 w/ CDY ______

## 2014-07-07 LAB — ALLERGY FULL PROFILE
Alternaria Alternata: <0.1 kU/L
Aspergillus fumigatus, m3: <0.1 kU/L
Bahia Grass: <0.1 kU/L
Bermuda Grass: <0.1 kU/L
Box Elder IgE: <0.1 kU/L
Candida Albicans: <0.1 kU/L
Curvularia lunata: <0.1 kU/L
D. farinae: <0.1 kU/L
Elm IgE: <0.1 kU/L
Fescue: <0.1 kU/L
G005 Rye, Perennial: <0.1 kU/L
G009 Red Top: <0.1 kU/L
House Dust Hollister: <0.1 kU/L
IGE (IMMUNOGLOBULIN E), SERUM: 53 kU/L (ref <115)
Lamb's Quarters: <0.1 kU/L
Oak: <0.1 kU/L
Plantain: <0.1 kU/L
Timothy Grass: <0.1 kU/L

## 2014-07-11 ENCOUNTER — Ambulatory Visit: Payer: Medicare Other | Admitting: Internal Medicine

## 2014-07-11 LAB — HYPERSENSITIVITY PNUEMONITIS PROFILE

## 2014-07-12 ENCOUNTER — Encounter: Payer: Self-pay | Admitting: Family Medicine

## 2014-07-12 ENCOUNTER — Ambulatory Visit (INDEPENDENT_AMBULATORY_CARE_PROVIDER_SITE_OTHER): Payer: Medicare Other | Admitting: Family Medicine

## 2014-07-12 VITALS — BP 115/76 | HR 144 | Temp 98.5°F | Ht 66.0 in | Wt 217.9 lb

## 2014-07-12 DIAGNOSIS — Z3040 Encounter for surveillance of contraceptives, unspecified: Secondary | ICD-10-CM

## 2014-07-12 DIAGNOSIS — Z30017 Encounter for initial prescription of implantable subdermal contraceptive: Secondary | ICD-10-CM | POA: Insufficient documentation

## 2014-07-12 DIAGNOSIS — Z3046 Encounter for surveillance of implantable subdermal contraceptive: Secondary | ICD-10-CM

## 2014-07-12 MED ORDER — ETONOGESTREL 68 MG ~~LOC~~ IMPL
68.0000 mg | DRUG_IMPLANT | Freq: Once | SUBCUTANEOUS | Status: AC
  Administered 2014-07-12: 68 mg via SUBCUTANEOUS

## 2014-07-12 NOTE — Assessment & Plan Note (Addendum)
Nexplanon removal and insertion today, previous Nexplanon in 04/2011, >3 years due for removal - With one episode of breakthrough bleeding prior to replacement - No recent sexual intercourse (>1 month), no Urine preg test obtained despite 2 month overlap in Nexplanon inserts  Plan: 1. Tolerated removal and insertion well, see detailed procedure note 2. Advised use condoms for 2 weeks, local wound care instructions provided, no complications 3. F/u PRN

## 2014-07-12 NOTE — Progress Notes (Signed)
   Subjective:    Patient ID: Makayla Vasquez, female    DOB: Jul 17, 1981, 33 y.o.   MRN: [id number]  HPI  CONTRACEPTION MANAGEMENT: - Presented today for removal and insertion of Nexplanon device, placed at [facility] 3+ years ago, that was first device. - Reports no regular periods while on Nexplanon. However stated she had bleeding with her last period about 1 month ago, and felt it was time to get it switched - Reported no sexual intercourse over past >1 month - Denies any recent illness, fevers/chills, abdominal pain, vaginal bleeding  I have reviewed and updated the following as appropriate: allergies and current medications  Social Hx: - Former smoker  Review of Systems  See above HPI    Objective:   Physical Exam  BP 115/76  Pulse 144  Temp(Src) 98.5 F (36.9 C) (Oral)  Ht 5\' 6"  (1.676 m)  Wt 217 lb 14.4 oz (98.839 kg)  BMI 35.19 kg/m2  LMP 06/27/2014  Gen - well-appearing, wearing supplemental O2 via [place], NAD Ext - Right Arm: palpable 4cm firm rod inside upper extremity consistent with existing Nexplanon. Non-tender, no erythema Skin - warm, dry, no rashes Neuro - awake, alert, oriented  Procedure Note: Patient given informed consent for removal of her Nexplanon, time out was performed.  Signed copy in the chart.  Appropriate time out taken. Nexplanon site identified.  Area prepped in usual sterile fashon, distal end of device marked on skin. One cc of 1% lidocaine was used to anesthetize the area at the distal end of the implant. A small stab incision was made right beside the implant on the distal portion.  The Nexplanon rod was manually manipulated until it was visible and grasped using hemostats and removed without difficulty.  There was less than 3 cc blood loss. There were no complications. See detailed note for Nexplanon insertion below:  Procedure Note:  Consent obtained and timeout performed. Her right arm was used. Existing incision was used for insertion. About 2cc  1% lidocaine with epinephrine was used to achieve anesthesia, along previous insertion site. Using sterile technique Nexplanon was inserted into the skin, and was no longer visible. Steri-Strip was applied over the wound. Device is palpable under the skin. Minimal bleeding < 1cc, and patient tolerated the procedure well.     Assessment & Plan:   See specific A&P problem list for details.

## 2014-07-12 NOTE — Patient Instructions (Signed)
Dear Makayla Vasquez, Thank you for coming in to clinic today. It was good to see you!  You had the Nexplanon Device removed today, and a new Nexplanon device was inserted today.  The device should last 3 years.  Expect spotting or breakthrough bleeding for the first 1-2 months. Use condoms for 2 weeks for birth control (temporarily) Take the dressing off tomorrow.  Do not submerge your arm underwater for 2 days, it may get wet in the shower. Let the steri strip fall off after 5-7 days. Dont swim for about 1 week.  If you have any other questions or concerns, please feel free to call the clinic to contact me. You may also schedule an earlier appointment if necessary.  However, if your symptoms get significantly worse, please go to the Emergency Department to seek immediate medical attention.  Saralyn Pilar, DO [facility]

## 2014-07-14 LAB — FUNGUS CULTURE W SMEAR: Fungal Smear: NONE SEEN

## 2014-07-26 ENCOUNTER — Encounter: Payer: Self-pay | Admitting: Internal Medicine

## 2014-07-26 ENCOUNTER — Telehealth: Payer: Self-pay | Admitting: *Deleted

## 2014-07-26 ENCOUNTER — Encounter: Payer: Self-pay | Admitting: *Deleted

## 2014-07-26 ENCOUNTER — Ambulatory Visit (INDEPENDENT_AMBULATORY_CARE_PROVIDER_SITE_OTHER): Payer: Medicare Other | Admitting: Internal Medicine

## 2014-07-26 VITALS — BP 98/64 | HR 72 | Ht 66.0 in | Wt 223.0 lb

## 2014-07-26 DIAGNOSIS — I471 Supraventricular tachycardia: Secondary | ICD-10-CM

## 2014-07-26 DIAGNOSIS — Q246 Congenital heart block: Secondary | ICD-10-CM | POA: Diagnosis not present

## 2014-07-26 DIAGNOSIS — I5022 Chronic systolic (congestive) heart failure: Secondary | ICD-10-CM | POA: Diagnosis not present

## 2014-07-26 DIAGNOSIS — I420 Dilated cardiomyopathy: Secondary | ICD-10-CM

## 2014-07-26 DIAGNOSIS — I4719 Other supraventricular tachycardia: Secondary | ICD-10-CM | POA: Insufficient documentation

## 2014-07-26 DIAGNOSIS — Z950 Presence of cardiac pacemaker: Secondary | ICD-10-CM | POA: Diagnosis not present

## 2014-07-26 LAB — MDC_IDC_ENUM_SESS_TYPE_INCLINIC
Battery Remaining Longevity: 52 mo
Battery Voltage: 3 V
Brady Statistic AP VP Percent: 5.38 %
Brady Statistic AP VS Percent: 0.05 %
Brady Statistic AS VS Percent: 0.91 %
Brady Statistic RV Percent Paced: 99.04 %
Lead Channel Impedance Value: 285 Ohm
Lead Channel Impedance Value: 342 Ohm
Lead Channel Impedance Value: 399 Ohm
Lead Channel Impedance Value: 418 Ohm
Lead Channel Impedance Value: 418 Ohm
Lead Channel Impedance Value: 437 Ohm
Lead Channel Impedance Value: 627 Ohm
Lead Channel Pacing Threshold Amplitude: 0.375 V
Lead Channel Pacing Threshold Amplitude: 0.75 V
Lead Channel Pacing Threshold Amplitude: 1 V
Lead Channel Pacing Threshold Pulse Width: 0.4 ms
Lead Channel Sensing Intrinsic Amplitude: 6.125 mV
Lead Channel Sensing Intrinsic Amplitude: 6.5 mV
Lead Channel Sensing Intrinsic Amplitude: 6.875 mV
Lead Channel Setting Pacing Amplitude: 2 V
Lead Channel Setting Pacing Pulse Width: 0.6 ms
MDC IDC MSMT LEADCHNL LV PACING THRESHOLD PULSEWIDTH: 0.4 ms
MDC IDC MSMT LEADCHNL RA IMPEDANCE VALUE: 361 Ohm
MDC IDC MSMT LEADCHNL RA SENSING INTR AMPL: 6.25 mV
MDC IDC MSMT LEADCHNL RV IMPEDANCE VALUE: 285 Ohm
MDC IDC MSMT LEADCHNL RV PACING THRESHOLD PULSEWIDTH: 0.4 ms
MDC IDC SESS DTM: 20151007113501
MDC IDC SET LEADCHNL LV PACING AMPLITUDE: 1.75 V
MDC IDC SET LEADCHNL LV PACING PULSEWIDTH: 0.4 ms
MDC IDC SET LEADCHNL RV PACING AMPLITUDE: 2 V
MDC IDC SET LEADCHNL RV SENSING SENSITIVITY: 0.9 mV
MDC IDC SET ZONE DETECTION INTERVAL: 330 ms
MDC IDC SET ZONE DETECTION INTERVAL: 400 ms
MDC IDC STAT BRADY AS VP PERCENT: 93.66 %
MDC IDC STAT BRADY RA PERCENT PACED: 5.43 %

## 2014-07-26 NOTE — Progress Notes (Signed)
HPI Makayla Vasquez returns today for followup. She is a very pleasant 33 year old woman with a history of congenital complete heart block, status post pacemaker insertion. She developed worsening left ventricular dysfunction and at the time of her most recent generator change, underwent upgrade to a biventricular pacemaker. In the interim she has worsened. She has become short of breath with hypoxemia. She is on home oxygen. The patient has developed what appears to be an atrial tachycardia, with heart rates of 115 beats per minute. She denies medical noncompliance. I cannot rule out sinus tachycardia, although previously her resting heart rate was in the 70s. She has not had syncope. She has a cough. She has undergone bronchoscopy demonstrating evidence of lung injury, but no definitive diagnosis as to the etiology of her pulmonary problems has been made. Repeat echocardiography demonstrates worsening left ventricular dysfunction, and her ejection fraction is 20-25%. Allergies  Allergen Reactions  . Sertraline Hcl Hives     Current Outpatient Prescriptions  Medication Sig Dispense Refill  . albuterol (PROVENTIL HFA;VENTOLIN HFA) 108 (90 BASE) MCG/ACT inhaler Inhale 2 puffs into the lungs every 4 (four) hours as needed for wheezing or shortness of breath.  1 each  1  . azelastine (ASTELIN) 0.1 % nasal spray Place 1-2 sprays into both nostrils at bedtime.  30 mL  11  . Blood Glucose Monitoring Suppl (ACCU-CHEK NANO SMARTVIEW) W/DEVICE KIT Use glucometer to check blood sugar once daily as needed for monitoring.  1 kit  0  . budesonide-formoterol (SYMBICORT) 80-4.5 MCG/ACT inhaler Inhale 2 puffs into the lungs 2 (two) times daily.       . cetirizine (ZYRTEC) 10 MG tablet Take 1 tablet (10 mg total) by mouth daily.  30 tablet  3  . fluticasone (FLONASE) 50 MCG/ACT nasal spray Place 1-2 sprays into both nostrils at bedtime.  16 g  11  . furosemide (LASIX) 20 MG tablet Take 20 mg by mouth daily as needed. For  shortness of breath and/or leg swelling.      [name] glucose blood (ACCU-CHEK SMARTVIEW) test strip Check blood sugar once daily as needed.  100 each  3  . lamoTRIgine (LAMICTAL) 100 MG tablet Take 1 tablet (100 mg total) by mouth daily. For 1 week, then may increase to 265m (2 tablets) daily.  45 tablet  1  . Lancets (ACCU-CHEK SOFT TOUCH) lancets Check blood sugar once daily as needed.  100 each  12  . metFORMIN (GLUCOPHAGE) 500 MG tablet Take 1 tablet (500 mg total) by mouth 2 (two) times daily with a meal.  60 tablet  3  . predniSONE (DELTASONE) 20 MG tablet Take 2 tablets (40 mg total) by mouth daily with breakfast.  60 tablet  0   No current facility-administered medications for this visit.     Past Medical History  Diagnosis Date  . Cardiac pacemaker     Since age 33 Last generator change in 2004  . Congenital complete AV block   . Obesity   . GERD (gastroesophageal reflux disease)   . Asymptomatic LV dysfunction     Echo in Dec 2011 with EF 35 to 40%. Felt to be due to paced rhythm  . Seizures     as a child- from high fever  . Anxiety   . Bipolar affective disorder   . Depression     bipolar  . Carpal tunnel syndrome of right wrist   . Asthma     seasonal allergies   . Arthritis  rheumatoid arthritis- mild, no rheumatology care   . Hypertension   . CHF (congestive heart failure)     ROS:   All systems reviewed and negative except as noted in the HPI.   Past Surgical History  Procedure Laterality Date  . Throat surgery  1994    s/p laser treatment  . Cesarean section    . Insert / replace / remove pacemaker      2001  . Cholecystectomy    . Iud removal  11/03/2011    Procedure: INTRAUTERINE DEVICE (IUD) REMOVAL;  Surgeon: [name], MD;  Location: [facility];  Service: Gynecology;  Laterality: N/A;  . Cyst excision  12/10/2012    THYROID  . Thyroglossal duct cyst N/A 12/10/2012    Procedure: REVISION OF THYROGLOSSAL DUCT CYST EXCISION;  Surgeon: [name],  MD;  Location: [place];  Service: ENT;  Laterality: N/A;  Revision of Thyroglossal Duct Cyst Excision  . Nasal fracture surgery      /w plate   . Carpal tunnel with cubital tunnel Right 07/26/2013    Procedure: RIGHT LIMITED OPEN CARPAL TUNNEL RELEASE ,  RIGHT CUBITAL TUNNEL RELEASE, INSITU VERSES ULNAR NERVE DECOMPRESSION AND ANTERIOR TRANSPOSITION;  Surgeon: [name], MD;  Location: [place];  Service: Orthopedics;  Laterality: Right;  . Video bronchoscopy Bilateral 06/19/2014    Procedure: VIDEO BRONCHOSCOPY WITHOUT FLUORO;  Surgeon: Brand Males, MD;  Location: [facility];  Service: Cardiopulmonary;  Laterality: Bilateral;     Family History  Problem Relation Age of Onset  . Heart disease Mother     CHF (no details)  . Hypertension Mother   . Heart disease Father     Murmur  . Heart disease Sister 26     No details.  History of a pacemaker  . Lupus Mother      History   Social History  . Marital Status: Single    Spouse Name: N/A    Number of Children: 1  . Years of Education: N/A   Occupational History  . CNA    Social History Main Topics  . Smoking status: Former Smoker -- 0.25 packs/day for .5 years    Types: Cigarettes    Quit date: 07/25/1996  . Smokeless tobacco: Never Used  . Alcohol Use: No  . Drug Use: No  . Sexual Activity: Yes    Birth Control/ Protection: Implant     Comment: placed 04/2011   Other Topics Concern  . Not on file   Social History Narrative   Works at [employer].       BP 98/64  Pulse 72  Ht 5' 6"  (1.676 m)  Wt 223 lb (101.152 kg)  BMI 36.01 kg/m2  LMP 06/27/2014  Physical Exam:  Chronically ill appearing obese, 33 year old woman, NAD HEENT: Unremarkable Neck:  No JVD, no thyromegally Lungs:  Clear with no wheezes, rales, or rhonchi. Well-healed pacemaker incision. HEART:  IRegular rate rhythm, no murmurs, no rubs, no clicks Abd:  soft, positive bowel sounds, no organomegally, no rebound, no guarding Ext:  2 plus  pulses, no edema, no cyanosis, no clubbing Skin:  No rashes no nodules Neuro:  CN II through XII intact, motor grossly intact  DEVICE  Normal device function.  See PaceArt for details.   Assess/Plan:

## 2014-07-26 NOTE — Assessment & Plan Note (Signed)
The patient appears to be in right atrial tachycardia. She could have sinus tachycardia although I think that would be unlikely. Previously her sinus rate was in the 60-70 range. It is now in the 120 range. I've discussed the treatment options in detail with the patient. I've recommended electrophysiologic study and catheter ablation of what appears to be a high right atrial tachycardia. Hopefully her heart pumping function will improve with restoration of sinus rhythm. I cannot conclusively rule out sinus tachycardia as the mechanism of her arrhythmia however.

## 2014-07-26 NOTE — Patient Instructions (Signed)
Your physician has recommended that you have an ablation. Catheter ablation is a medical procedure used to treat some cardiac arrhythmias (irregular heartbeats). During catheter ablation, a long, thin, flexible tube is put into a blood vessel in your groin (upper thigh), or neck. This tube is called an ablation catheter. It is then guided to your heart through the blood vessel. Radio frequency waves destroy small areas of heart tissue where abnormal heartbeats may cause an arrhythmia to start.   We will mail your information to you, but will plan on your procedure for 08/14/14.

## 2014-07-26 NOTE — Telephone Encounter (Signed)
I spoke with the patient about her ablation that is scheduled for 10/26. She is aware of the date, time, and instructions. A paper copy will also be mailed to her.

## 2014-07-26 NOTE — Assessment & Plan Note (Signed)
Her Medtronic biventricular pacemaker is working normally, but she is not biventricular pacing because of her underlying atrial arrhythmia. Hopefully biventricular pacing can be improved following catheter ablation.

## 2014-07-27 ENCOUNTER — Encounter: Payer: Self-pay | Admitting: *Deleted

## 2014-07-27 ENCOUNTER — Other Ambulatory Visit: Payer: Self-pay | Admitting: *Deleted

## 2014-08-01 LAB — AFB CULTURE WITH SMEAR (NOT AT ARMC): Acid Fast Smear: NONE SEEN

## 2014-08-07 ENCOUNTER — Ambulatory Visit (INDEPENDENT_AMBULATORY_CARE_PROVIDER_SITE_OTHER)
Admission: RE | Admit: 2014-08-07 | Discharge: 2014-08-07 | Disposition: A | Discharge status: Home / Self Care | Payer: Medicare Other | Source: Ambulatory Visit | Attending: Internal Medicine | Admitting: Internal Medicine

## 2014-08-07 ENCOUNTER — Ambulatory Visit: Payer: Medicare Other

## 2014-08-07 ENCOUNTER — Encounter: Payer: Self-pay | Admitting: Internal Medicine

## 2014-08-07 ENCOUNTER — Other Ambulatory Visit (INDEPENDENT_AMBULATORY_CARE_PROVIDER_SITE_OTHER): Payer: Medicare Other | Admitting: *Deleted

## 2014-08-07 ENCOUNTER — Ambulatory Visit (INDEPENDENT_AMBULATORY_CARE_PROVIDER_SITE_OTHER): Payer: Medicare Other | Admitting: Internal Medicine

## 2014-08-07 VITALS — BP 132/108 | HR 48 | Ht 66.0 in | Wt 221.2 lb

## 2014-08-07 DIAGNOSIS — I420 Dilated cardiomyopathy: Secondary | ICD-10-CM

## 2014-08-07 DIAGNOSIS — I471 Supraventricular tachycardia: Secondary | ICD-10-CM | POA: Diagnosis not present

## 2014-08-07 DIAGNOSIS — J849 Interstitial pulmonary disease, unspecified: Secondary | ICD-10-CM

## 2014-08-07 DIAGNOSIS — Z23 Encounter for immunization: Secondary | ICD-10-CM | POA: Diagnosis not present

## 2014-08-07 DIAGNOSIS — J984 Other disorders of lung: Secondary | ICD-10-CM | POA: Diagnosis not present

## 2014-08-07 DIAGNOSIS — J9611 Chronic respiratory failure with hypoxia: Secondary | ICD-10-CM

## 2014-08-07 DIAGNOSIS — J9610 Chronic respiratory failure, unspecified whether with hypoxia or hypercapnia: Secondary | ICD-10-CM | POA: Diagnosis not present

## 2014-08-07 LAB — BASIC METABOLIC PANEL
BUN: 19 mg/dL (ref 6–23)
CALCIUM: 9.5 mg/dL (ref 8.4–10.5)
CO2: 19 mEq/L (ref 19–32)
CREATININE: 1 mg/dL (ref 0.4–1.2)
Chloride: 105 mEq/L (ref 96–112)
GFR: 79.69 mL/min (ref >60.00)
GLUCOSE: 127 mg/dL — AB (ref 70–99)
Potassium: 3.3 mEq/L — ABNORMAL LOW (ref 3.5–5.1)
SODIUM: 141 meq/L (ref 135–145)

## 2014-08-07 LAB — CBC WITH DIFFERENTIAL/PLATELET
Basophils Absolute: 0 10*3/uL (ref 0.0–0.1)
Basophils Relative: 0.5 % (ref 0.0–3.0)
EOS ABS: 0 10*3/uL (ref 0.0–0.7)
EOS PCT: 0.3 % (ref 0.0–5.0)
HCT: 43.8 % (ref 36.0–46.0)
Hemoglobin: 14.4 g/dL (ref 12.0–15.0)
LYMPHS PCT: 30.3 % (ref 12.0–46.0)
Lymphs Abs: 2.5 10*3/uL (ref 0.7–4.0)
MCHC: 32.8 g/dL (ref 30.0–36.0)
MCV: 82.4 fl (ref 78.0–100.0)
MONO ABS: 0.5 10*3/uL (ref 0.1–1.0)
Monocytes Relative: 5.6 % (ref 3.0–12.0)
Neutro Abs: 5.3 10*3/uL (ref 1.4–7.7)
Neutrophils Relative %: 63.3 % (ref 43.0–77.0)
PLATELETS: 288 10*3/uL (ref 150.0–400.0)
RBC: 5.32 Mil/uL — AB (ref 3.87–5.11)
RDW: 15.6 % — ABNORMAL HIGH (ref 11.5–15.5)
WBC: 8.4 10*3/uL (ref 4.0–10.5)

## 2014-08-07 LAB — PROTIME-INR
INR: 1.1 ratio — AB (ref 0.8–1.0)
Prothrombin Time: 12.7 s (ref 9.6–13.1)

## 2014-08-07 MED ORDER — PREDNISONE (PAK) 10 MG PO TABS: ORAL_TABLET | Freq: Every day | ORAL | Status: DC

## 2014-08-07 NOTE — Patient Instructions (Addendum)
Flu vax  Order- CXR   Dx Interstitial lung disease, chronic respiratory failure  Try reducing prednisone to take 20 mg alternating with 10 mg, every other day   Script prednisone sent, changing to 10 mg pills for  Convenience  Ok to keep O2 set at 3 liters when resting quietly and comfortable, but set at 4 L when active.

## 2014-08-07 NOTE — Progress Notes (Signed)
02/23/14- 36 yoF former smoker Referred by Dr Bridgett Larsson for allergy consult.  C/o itchy, watery eyes, sinus congestion, headaches, prodcough with green mucous X several years. Mother and son are here She had been going to allergist at [facility] for allergic rhinitis, itching eyes and chest tightness for years as an adult. Typically worse in spring and summer, with occasional colds and with exposure to house dust. Daily wheeze.She tried allergy shots for 2 months, 2 years ago but had strong local reactions. 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 Mother has hx allergies. CXR 5/ 8 /14 IMPRESSION:  No active disease. No significant change.  Original Report Authenticated By: Lahoma Crocker, M.D.  04/06/14- 67 yoF former smoker Referred by Dr Bridgett Larsson for allergy consult.  C/o itchy, watery eyes, sinus congestion, headaches, prodcough with green mucous X several years. Mother and son are here FOLLOWS FOR: Allergies have been doing well until outdoors with trees,etc. Allergy profile 02/23/2014-negative with total IgE 22.9. She does give history of allergy shots in the past, stopped because of large local reactions. Past history suggests stronger allergic reactions when she was younger. We can retest her if there is a question She still reports some itching in her throat and cough sometimes green sputum  06/27/14 [facility] follow up  Returns for follow up from recent hospital stay.  Reports breathing is doing well today. Is suppose to be on O2 at 4lm however did not bring O2 b/c   portable tank at home is malfunctioned. DME contacted and at home to fix system. Advised on importance of O2 use.  Says were very low off Oxygen - sats 54%-83% on room air unpon entering the exam room. Sats increased 90% on 4l/m  Admitted last week for significant hypoxia   Patient presented with hypoxemia of unknown etiology, requiring 4L of oxygen. Notably, she was hospitalized from 8/11-8/14 with a  similar presentation and was thought to have a viral pneumonitis complicated by atelectasis and mild edema vs. an autoimmune process and was discharged home on a course of levaquin and prednisone.  Her CT scan from 8/28 showed bilateral progressive lower peribronchial infiltrates with groundglass changes and nodular patter consistent with acute pneumonitis of unclear etiology. During her last admission, she was found to have a positive ANA, mildly elevated ESR, non-reactive HIV, and negative PPD. Also during her last hospitalization, cardiology saw her for a repeat echo that showed a decrease in EF (25-30%, from baseline 30-25% in 2013).  Viral panel was positive for rhinovirus previous admission but neg for this admit .  ESR was elevated 62 , decreased today at 55.  Pulmonology was consulted and did a BAL on 9/1 that showed 60% macrophages and 30% PMNs. Thus far, her AFB smear, fungal smear, legionella Ab, pneumocystis smear, ACE, and sputum culture are negative. Fungal neg , bal cx neg w/ non path oral flora , legionella neg  Has family hx of Lupus.  Anti smith ab neg and anti dsDNA neg  HSP panel was neg.  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 Status: Edited Related Problem: ILD (interstitial lung disease)    #1 bilateral progressive lower lobe peribronchial infiltrates with groundglass changes and nodular pattern consistent with acute pneumonitis of unclear etiology.  Note previous serologic studies show a speckled positive ANA 1-80 pattern and no evidence of  allergy factors or elevated IgE. This inflammatory pneumonitis is not yet discerned as to etiology therefore bronchoscopy would be indicated and will be pursued  Previous viral panel + for rhinovirus  BAL cx neg to date.  HSP neg .  Will cont on steroid challenge with close  follow up  Case discussed with Dr. Annamaria Boots  Clinically she appears stable , will have her back in office with follow up cxr in 1 week as cxr w/ worse aeration today  Plan  Taper Prednisone $RemoveBeforeDE'40mg'[id number]$  daily and hold at this dose.  Continue on Oxygen 4l/m continuous flow 24/7 .  Labs and chest xray today .  Follow up Dr. Annamaria Boots In 1 weeks and As needed  Please contact office for sooner follow up if symptoms do not improve or worsen or seek emergency care    07/06/14-32 yoF former smoker originaly referred for allergy evaluation, rhinitis, asthmatic bronchitis, then hosp for ?ALI with lower zone pneumonitis/ BAL and special studies NEG,. Acute on Chronic respiratory failure. Pacemaker for AV block  O2 4L/ Advanced FOLLOWS FOR: Pt c/o DOE, prod cough with clear mucus and chest tightness when coughing. Pt last saw TP on 06/27/2014 for HFU .   08/07/14- 33 yoF former smoker originaly referred for allergy evaluation, rhinitis, asthmatic bronchitis, then hosp for ?ALI with lower zone pneumonitis/ BAL and special studies NEG,. Acute on Chronic respiratory failure. Pacemaker for AV block  O2 4L/ Advanced      Husband and son are here Continues oxygen but feels comfortable at rest taking it off for short intervals. As instructed, prednisone is now down to 20 mg daily for the last 2 weeks. She feels stable, not worse. Coughs a lot, clear mucus. Denies fever, blood, purulent sputum, edema, adenopathy. Pending cardiac ablation seeking better control of heart rate. CXR 07/06/14 IMPRESSION:  1. No change in aeration to the lungs compared with the previous  exam.  Electronically Signed  By: Kerby Moors M.D.  On: 07/06/2014 15:56  ROS-see HPI Constitutional:   No-   weight loss, night sweats, fevers, chills, fatigue, lassitude. HEENT:   , difficulty swallowing, tooth/dental problems, sore throat,       +sneezing, +itching, ear ache, +nasal congestion, post nasal drip,  CV:  No-   chest pain, orthopnea, PND, swelling  in lower extremities, anasarca,                                  dizziness, palpitations Resp: No-   shortness of breath with exertion or at rest.              +productive cough,  No non-productive cough,  No- coughing up of blood.               No- wheezing.   Skin: No-   rash or lesions. GI:  + heartburn, +indigestion, abdominal pain, nausea, vomiting,  GU:  MS:  No-   joint pain or swelling.   Neuro-     nothing unusual Psych:  No- change in mood or affect. No depression or anxiety.  No memory loss.  OBJ- Physical Exam   O2 3 L/95% General- Alert, Oriented, Affect-appropriate, Distress- none acute, + wheelchair Skin- rash-none, lesions- none, excoriation- none Lymphadenopathy- none Head- atraumatic            Eyes- Gross vision intact, PERRLA, conjunctivae and secretions clear  Ears- Hearing, canals-normal            Nose- +, no-Septal dev, mucus, polyps, erosion, perforation             Throat- Mallampati III , mucosa clear , drainage- none, tonsils- atrophic Neck- flexible , trachea midline, no stridor , thyroid nl, carotid no bruit Chest - symmetrical excursion , unlabored           Heart/CV- RRR , no murmur , no gallop  , no rub, nl s1 s2                           - JVD- none , edema- none, stasis changes- none, varices- none           Lung- clear to P&A diminished in bases, wheeze- none, cough+ light , dullness-none, rub- none           Chest wall-  Abd-  Br/ Gen/ Rectal- Not done, not indicated Extrem- cyanosis- none, clubbing, none, atrophy- none, strength- nl Neuro- grossly intact to observation

## 2014-08-07 NOTE — Assessment & Plan Note (Signed)
Cardiomegaly still evident on chest x-ray.

## 2014-08-07 NOTE — Assessment & Plan Note (Signed)
This x-rays haven't suggested recent change. This may be a combination of congestive heart failure and chronic inflammation. Consider possibility of reflux and aspiration. At some point the potential benefit from prednisone may be outweighed by steroid side effects as discussed. We will try reducing the dose a little more and watching for response. Plan-flu vaccine, chest x-ray, reduce prednisone to alternate 20 mg with 10 mg every other day

## 2014-08-08 ENCOUNTER — Other Ambulatory Visit: Payer: Self-pay | Admitting: *Deleted

## 2014-08-08 DIAGNOSIS — E876 Hypokalemia: Secondary | ICD-10-CM

## 2014-08-08 MED ORDER — POTASSIUM CHLORIDE CRYS ER 20 MEQ PO TBCR: EXTENDED_RELEASE_TABLET | ORAL | Status: DC

## 2014-08-09 ENCOUNTER — Encounter (HOSPITAL_COMMUNITY): Payer: Self-pay | Admitting: Pharmacy Technician

## 2014-08-11 ENCOUNTER — Other Ambulatory Visit (INDEPENDENT_AMBULATORY_CARE_PROVIDER_SITE_OTHER): Payer: Medicare Other | Admitting: *Deleted

## 2014-08-11 DIAGNOSIS — E876 Hypokalemia: Secondary | ICD-10-CM

## 2014-08-11 LAB — BASIC METABOLIC PANEL
BUN: 12 mg/dL (ref 6–23)
CALCIUM: 9 mg/dL (ref 8.4–10.5)
CHLORIDE: 106 meq/L (ref 96–112)
CO2: 24 meq/L (ref 19–32)
CREATININE: 1.1 mg/dL (ref 0.4–1.2)
GFR: 76.22 mL/min (ref >60.00)
Glucose, Bld: 108 mg/dL — ABNORMAL HIGH (ref 70–99)
Potassium: 3.6 mEq/L (ref 3.5–5.1)
SODIUM: 139 meq/L (ref 135–145)

## 2014-08-14 ENCOUNTER — Ambulatory Visit (HOSPITAL_COMMUNITY)
Admission: RE | Admit: 2014-08-14 | Discharge: 2014-08-14 | Disposition: A | Discharge status: Home / Self Care | Payer: Medicare Other | Source: Ambulatory Visit | Attending: Internal Medicine | Admitting: Internal Medicine

## 2014-08-14 ENCOUNTER — Encounter (HOSPITAL_COMMUNITY)
Admission: RE | Disposition: A | Discharge status: Home / Self Care | Payer: Self-pay | Source: Ambulatory Visit | Attending: Internal Medicine

## 2014-08-14 ENCOUNTER — Encounter (HOSPITAL_COMMUNITY): Payer: Self-pay | Admitting: Physician Assistant

## 2014-08-14 DIAGNOSIS — K219 Gastro-esophageal reflux disease without esophagitis: Secondary | ICD-10-CM | POA: Diagnosis not present

## 2014-08-14 DIAGNOSIS — J4520 Mild intermittent asthma, uncomplicated: Secondary | ICD-10-CM | POA: Insufficient documentation

## 2014-08-14 DIAGNOSIS — Z9981 Dependence on supplemental oxygen: Secondary | ICD-10-CM | POA: Diagnosis not present

## 2014-08-14 DIAGNOSIS — Z79899 Other long term (current) drug therapy: Secondary | ICD-10-CM | POA: Insufficient documentation

## 2014-08-14 DIAGNOSIS — Z6836 Body mass index (BMI) 36.0-36.9, adult: Secondary | ICD-10-CM | POA: Diagnosis not present

## 2014-08-14 DIAGNOSIS — R000 Tachycardia, unspecified: Secondary | ICD-10-CM | POA: Diagnosis present

## 2014-08-14 DIAGNOSIS — R0902 Hypoxemia: Secondary | ICD-10-CM | POA: Diagnosis not present

## 2014-08-14 DIAGNOSIS — Z538 Procedure and treatment not carried out for other reasons: Secondary | ICD-10-CM | POA: Diagnosis not present

## 2014-08-14 DIAGNOSIS — M069 Rheumatoid arthritis, unspecified: Secondary | ICD-10-CM | POA: Insufficient documentation

## 2014-08-14 DIAGNOSIS — Z950 Presence of cardiac pacemaker: Secondary | ICD-10-CM | POA: Diagnosis not present

## 2014-08-14 DIAGNOSIS — Z7951 Long term (current) use of inhaled steroids: Secondary | ICD-10-CM | POA: Diagnosis not present

## 2014-08-14 DIAGNOSIS — I509 Heart failure, unspecified: Secondary | ICD-10-CM | POA: Insufficient documentation

## 2014-08-14 DIAGNOSIS — E669 Obesity, unspecified: Secondary | ICD-10-CM | POA: Diagnosis not present

## 2014-08-14 DIAGNOSIS — F319 Bipolar disorder, unspecified: Secondary | ICD-10-CM | POA: Diagnosis not present

## 2014-08-14 DIAGNOSIS — I10 Essential (primary) hypertension: Secondary | ICD-10-CM | POA: Diagnosis not present

## 2014-08-14 DIAGNOSIS — I471 Supraventricular tachycardia: Secondary | ICD-10-CM

## 2014-08-14 HISTORY — DX: Tachycardia, unspecified: R00.0

## 2014-08-14 HISTORY — DX: Pneumonia, unspecified organism: J18.9

## 2014-08-14 HISTORY — DX: Other disorders of lung: J98.4

## 2014-08-14 HISTORY — PX: ATRIAL TACH ABLATION: SHX5734

## 2014-08-14 LAB — TSH: TSH: 2.34 u[IU]/mL (ref 0.350–4.500)

## 2014-08-14 LAB — GLUCOSE, CAPILLARY
GLUCOSE-CAPILLARY: 88 mg/dL (ref 70–99)
GLUCOSE-CAPILLARY: 92 mg/dL (ref 70–99)
Glucose-Capillary: 38 mg/dL — CL (ref 70–99)
Glucose-Capillary: 88 mg/dL (ref 70–99)
Glucose-Capillary: 98 mg/dL (ref 70–99)
Glucose-Capillary: 157 mg/dL — ABNORMAL HIGH (ref 70–99)

## 2014-08-14 LAB — PREGNANCY, URINE: PREG TEST UR: NEGATIVE

## 2014-08-14 SURGERY — ATRIAL TACH ABLATION
Anesthesia: LOCAL

## 2014-08-14 MED ORDER — ACETAMINOPHEN 325 MG PO TABS
650.0000 mg | ORAL_TABLET | ORAL | Status: DC | PRN
  Filled 2014-08-14: qty 2

## 2014-08-14 MED ORDER — FENTANYL CITRATE 0.05 MG/ML IJ SOLN
INTRAMUSCULAR | Status: AC
  Filled 2014-08-14: qty 2

## 2014-08-14 MED ORDER — DEXTROSE 50 % IV SOLN
INTRAVENOUS | Status: AC
  Filled 2014-08-14: qty 50

## 2014-08-14 MED ORDER — SODIUM CHLORIDE 0.9 % IJ SOLN: 3.0000 mL | Freq: Two times a day (BID) | INTRAMUSCULAR | Status: DC

## 2014-08-14 MED ORDER — HEPARIN (PORCINE) IN NACL 2-0.9 UNIT/ML-% IJ SOLN
INTRAMUSCULAR | Status: AC
  Filled 2014-08-14: qty 500

## 2014-08-14 MED ORDER — BUPIVACAINE HCL (PF) 0.25 % IJ SOLN
INTRAMUSCULAR | Status: AC
  Filled 2014-08-14: qty 60

## 2014-08-14 MED ORDER — MIDAZOLAM HCL 5 MG/5ML IJ SOLN
INTRAMUSCULAR | Status: AC
  Filled 2014-08-14: qty 5

## 2014-08-14 MED ORDER — ONDANSETRON HCL 4 MG/2ML IJ SOLN: 4.0000 mg | Freq: Four times a day (QID) | INTRAMUSCULAR | Status: DC | PRN

## 2014-08-14 MED ORDER — SODIUM CHLORIDE 0.9 % IV SOLN: 250.0000 mL | INTRAVENOUS | Status: DC | PRN

## 2014-08-14 MED ORDER — SODIUM CHLORIDE 0.9 % IJ SOLN: 3.0000 mL | INTRAMUSCULAR | Status: DC | PRN

## 2014-08-14 NOTE — Progress Notes (Signed)
Site area:rt femoral groin  Site Prior to Removal:  Level 0 Pressure Applied For:15 munites Manual:   yes Patient Status During Pull:  stable Post Pull Site:  Level 0 Post Pull Instructions Given:  Given to patient Post Pull Pulses Present: palpable Dressing Applied: clear Bedrest begins @ 1245 Comments:no complications

## 2014-08-14 NOTE — Discharge Instructions (Signed)

## 2014-08-14 NOTE — Interval H&P Note (Signed)
History and Physical Interval Note:  08/14/2014 10:03 AM  Makayla Vasquez  has presented today for surgery, with the diagnosis of atach  The various methods of treatment have been discussed with the patient and family. After consideration of risks, benefits and other options for treatment, the patient has consented to  Procedure(s): ATRIAL TACH ABLATION (N/A) as a surgical intervention .  The patient's history has been reviewed, patient examined, no change in status, stable for surgery.  I have reviewed the patient's chart and labs.  Questions were answered to the patient's satisfaction.     Leonia Reeves.D.

## 2014-08-14 NOTE — H&P (View-Only) (Signed)
HPI Makayla Vasquez returns today for followup. She is a very pleasant 33 year old woman with a history of congenital complete heart block, status post pacemaker insertion. She developed worsening left ventricular dysfunction and at the time of her most recent generator change, underwent upgrade to a biventricular pacemaker. In the interim she has worsened. She has become short of breath with hypoxemia. She is on home oxygen. The patient has developed what appears to be an atrial tachycardia, with heart rates of 115 beats per minute. She denies medical noncompliance. I cannot rule out sinus tachycardia, although previously her resting heart rate was in the 70s. She has not had syncope. She has a cough. She has undergone bronchoscopy demonstrating evidence of lung injury, but no definitive diagnosis as to the etiology of her pulmonary problems has been made. Repeat echocardiography demonstrates worsening left ventricular dysfunction, and her ejection fraction is 20-25%. Allergies  Allergen Reactions  . Sertraline Hcl Hives     Current Outpatient Prescriptions  Medication Sig Dispense Refill  . albuterol (PROVENTIL HFA;VENTOLIN HFA) 108 (90 BASE) MCG/ACT inhaler Inhale 2 puffs into the lungs every 4 (four) hours as needed for wheezing or shortness of breath.  1 each  1  . azelastine (ASTELIN) 0.1 % nasal spray Place 1-2 sprays into both nostrils at bedtime.  30 mL  11  . Blood Glucose Monitoring Suppl (ACCU-CHEK NANO SMARTVIEW) W/DEVICE KIT Use glucometer to check blood sugar once daily as needed for monitoring.  1 kit  0  . budesonide-formoterol (SYMBICORT) 80-4.5 MCG/ACT inhaler Inhale 2 puffs into the lungs 2 (two) times daily.       . cetirizine (ZYRTEC) 10 MG tablet Take 1 tablet (10 mg total) by mouth daily.  30 tablet  3  . fluticasone (FLONASE) 50 MCG/ACT nasal spray Place 1-2 sprays into both nostrils at bedtime.  16 g  11  . furosemide (LASIX) 20 MG tablet Take 20 mg by mouth daily as needed. For  shortness of breath and/or leg swelling.      [name] glucose blood (ACCU-CHEK SMARTVIEW) test strip Check blood sugar once daily as needed.  100 each  3  . lamoTRIgine (LAMICTAL) 100 MG tablet Take 1 tablet (100 mg total) by mouth daily. For 1 week, then may increase to 261m (2 tablets) daily.  45 tablet  1  . Lancets (ACCU-CHEK SOFT TOUCH) lancets Check blood sugar once daily as needed.  100 each  12  . metFORMIN (GLUCOPHAGE) 500 MG tablet Take 1 tablet (500 mg total) by mouth 2 (two) times daily with a meal.  60 tablet  3  . predniSONE (DELTASONE) 20 MG tablet Take 2 tablets (40 mg total) by mouth daily with breakfast.  60 tablet  0   No current facility-administered medications for this visit.     Past Medical History  Diagnosis Date  . Cardiac pacemaker     Since age 33 Last generator change in 2004  . Congenital complete AV block   . Obesity   . GERD (gastroesophageal reflux disease)   . Asymptomatic LV dysfunction     Echo in Dec 2011 with EF 35 to 40%. Felt to be due to paced rhythm  . Seizures     as a child- from high fever  . Anxiety   . Bipolar affective disorder   . Depression     bipolar  . Carpal tunnel syndrome of right wrist   . Asthma     seasonal allergies   . Arthritis  rheumatoid arthritis- mild, no rheumatology care   . Hypertension   . CHF (congestive heart failure)     ROS:   All systems reviewed and negative except as noted in the HPI.   Past Surgical History  Procedure Laterality Date  . Throat surgery  1994    s/p laser treatment  . Cesarean section    . Insert / replace / remove pacemaker      2001  . Cholecystectomy    . Iud removal  11/03/2011    Procedure: INTRAUTERINE DEVICE (IUD) REMOVAL;  Surgeon: [name], MD;  Location: [facility];  Service: Gynecology;  Laterality: N/A;  . Cyst excision  12/10/2012    THYROID  . Thyroglossal duct cyst N/A 12/10/2012    Procedure: REVISION OF THYROGLOSSAL DUCT CYST EXCISION;  Surgeon: [name],  MD;  Location: [place];  Service: ENT;  Laterality: N/A;  Revision of Thyroglossal Duct Cyst Excision  . Nasal fracture surgery      /w plate   . Carpal tunnel with cubital tunnel Right 07/26/2013    Procedure: RIGHT LIMITED OPEN CARPAL TUNNEL RELEASE ,  RIGHT CUBITAL TUNNEL RELEASE, INSITU VERSES ULNAR NERVE DECOMPRESSION AND ANTERIOR TRANSPOSITION;  Surgeon: [name], MD;  Location: [place];  Service: Orthopedics;  Laterality: Right;  . Video bronchoscopy Bilateral 06/19/2014    Procedure: VIDEO BRONCHOSCOPY WITHOUT FLUORO;  Surgeon: Brand Males, MD;  Location: [place];  Service: Cardiopulmonary;  Laterality: Bilateral;     Family History  Problem Relation Age of Onset  . Heart disease Mother     CHF (no details)  . Hypertension Mother   . Heart disease Father     Murmur  . Heart disease Sister 43     No details.  History of a pacemaker  . Lupus Mother      History   Social History  . Marital Status: Single    Spouse Name: N/A    Number of Children: 1  . Years of Education: N/A   Occupational History  . CNA    Social History Main Topics  . Smoking status: Former Smoker -- 0.25 packs/day for .5 years    Types: Cigarettes    Quit date: 07/25/1996  . Smokeless tobacco: Never Used  . Alcohol Use: No  . Drug Use: No  . Sexual Activity: Yes    Birth Control/ Protection: Implant     Comment: placed 04/2011   Other Topics Concern  . Not on file   Social History Narrative   Works at [employer].       BP 98/64  Pulse 72  Ht 5' 6"  (1.676 m)  Wt 223 lb (101.152 kg)  BMI 36.01 kg/m2  LMP 06/27/2014  Physical Exam:  Chronically ill appearing obese, 33 year old woman, NAD HEENT: Unremarkable Neck:  No JVD, no thyromegally Lungs:  Clear with no wheezes, rales, or rhonchi. Well-healed pacemaker incision. HEART:  IRegular rate rhythm, no murmurs, no rubs, no clicks Abd:  soft, positive bowel sounds, no organomegally, no rebound, no guarding Ext:  2 plus  pulses, no edema, no cyanosis, no clubbing Skin:  No rashes no nodules Neuro:  CN II through XII intact, motor grossly intact  DEVICE  Normal device function.  See PaceArt for details.   Assess/Plan:

## 2014-08-14 NOTE — Progress Notes (Signed)
Pt eating [place] sandwich awaiting ready room in short stay.

## 2014-08-14 NOTE — CV Procedure (Addendum)
EP Procedure Note  Procedure: Invasive EP study  Preoperative Diagnosis: SVT  Postoperative Diagnosis: Sinus tachycardia  Findings: Full dictated note to follow. No inducible SVT and no ablation carried out. EBL - 30 cc.  Sharlot Gowda Taylor,M.D.  F#[id number].

## 2014-08-14 NOTE — Discharge Summary (Signed)
Electrophysiology Discharge Summary   Patient ID: DELAYZA LUNGREN MRN: [id number], DOB/AGE: 20-Mar-1981 33 y.o. Admit date: 08/14/2014 D/C date:     08/14/2014  Primary Care Provider: Nobie Putnam, DO Primary Electrophysiologist: Dr. Lovena Le   Primary Discharge Diagnoses:  1. Sinus tachycardia - did NOT require ablation (not atrial tachycardia)  Secondary Discharge Diagnoses:  1. Recent inflammatory pneumonitis a/w hypoxia 2. H/o congenital complete heart block s/p pacemaker 3. H/o LV dysfunction, s/p Bi-V upgrade in 2013 4. Obesity Body mass index is 35.53 kg/(m^2). 5. GERD (gastroesophageal reflux disease)   6. Seizures as a child- from high fever  7. Anxiety   8. Bipolar affective disorder   9. Depression bipolar  10. Carpal tunnel syndrome of right wrist  11. Asthma / seasonal allergies   12. Arthritis rheumatoid arthritis- mild, no rheumatology care 13. Hypertension  Hospital Course: Ms. Makayla Vasquez is a 33 year old woman with a history of congenital complete heart block s/p PPM, development of LV dysfunction felt due to her paced rhythm s/p upgrade to [place] in 2013, obesity, bipolar disorder, anxiety, and recent inflammatory pneumonitis of unclear etiology who presented to [facility] today for possible ablation.   She has been plagued by pulmonary issue recently with hypoxia. She was hospitalized from 8/11-8/14 with dyspnea/hypoxia and was thought to have a viral pneumonitis complicated by atelectasis and mild edema vs. an autoimmune process and was discharged home on a course of levaquin and prednisone. Testing showed positive ANA, mildly elevated ESR, non-reactive HIV, and negative PPD. Also during her last hospitalization, cardiology saw her for a repeat echo that showed a decrease in EF (25-30%, from baseline 30-25% in 2013) but her symptoms were not felt cardiac in etiology. She was readmitted at the end of 05/2014 with progressive lower lobe peribronchial  infiltrates with ground glass changes and nodular pattern consistent with acute pneumonitis of unclear etiology. There was no PE. Pulmonology was consulted and did a BAL on 9/1 that showed 60% macrophages and 30% PMNs. Her AFB smear, fungal smear, legionella Ab, pneumocystis smear, ACE, Anti smith ab neg, anti dsDNA, HSP panel, and sputum culture were negative Per pulmonology, the differential included aspiration, acute infection, BOOP (though BAL not consistent with this), lupus, and ALI with DAD. She has been tried on steroid challenge. The inflammatory pneumonitis is still of unclear etiology.   She was seen by Dr. Lovena Le who felt she might be in atrial tachycardia at the time at 120bpm vs sinus tachycardia. Pacemaker showed normal device function. He recommended consideration of atrial tach ablation - she was brought in for this procedure today, but was conclusively diagnosed with sinus tach, without atrial tachycardia. She was observed post-procedure and did well without complication. TSH and free T4 were sent off prior to discharge. By recent labwork on 08/07/14 she was not anemic, K was 3.6. HR was in the 70s-low 100s on day of procedure. Dr. Lovena Le has seen and examined the patient today and feels she is stable for discharge. I have left a message on our office's scheduling voicemail requesting a follow-up appointment, and our office will call the patient with this appointment.   Discharge Vitals: Blood pressure 110/71, pulse 86, temperature 97.6 F (36.4 C), temperature source Oral, resp. rate 0, height 5' 6"  (1.676 m), weight 220 lb (99.791 kg), last menstrual period 06/21/2014, SpO2 98.00%.  Labs: Lab Results  Component Value Date   WBC 8.4 08/07/2014   HGB 14.4 08/07/2014   HCT 43.8 08/07/2014   MCV  82.4 08/07/2014   PLT 288.0 08/07/2014    Recent Labs Lab 08/11/14 1003  NA 139  K 3.6  CL 106  CO2 24  BUN 12  CREATININE 1.1  CALCIUM 9.0  GLUCOSE 108*    Lab Results    Component Value Date   CHOL 172 01/03/2011   HDL 35* 01/03/2011   LDLCALC 98 01/03/2011   TRIG 193* 01/03/2011     Diagnostic Studies/Procedures   Dg Chest 2 View 08/07/2014   CLINICAL DATA:  Interstitial lung disease, chronic respiratory failure.  EXAM: CHEST  2 VIEW  COMPARISON:  July 06, 2014  FINDINGS: The heart size and mediastinal contours are stable. Cardiomegaly with cardiac pacemaker is unchanged. Consolidation of bilateral lung bases are unchanged. The visualized skeletal structures are stable.  IMPRESSION: Consolidation of bilateral lung bases are unchanged compared to previous exam.   Electronically Signed   By: Abelardo Diesel M.D.   On: 08/07/2014 13:35   EP procedure WITHOUT ablation since rhythm was deemed sinus tach - see Epic  Discharge Medications   Current Discharge Medication List    CONTINUE these medications which have NOT CHANGED   Details  albuterol (PROVENTIL HFA;VENTOLIN HFA) 108 (90 BASE) MCG/ACT inhaler Inhale 2 puffs into the lungs every 4 (four) hours as needed for wheezing or shortness of breath.    Associated Diagnoses: Mild intermittent asthma without complication    Azelastine-Fluticasone (DYMISTA) 137-50 MCG/ACT SUSP Place 2 sprays into both nostrils at bedtime.    budesonide-formoterol (SYMBICORT) 80-4.5 MCG/ACT inhaler Inhale 2 puffs into the lungs 2 (two) times daily as needed (for shortness of breath).     cetirizine (ZYRTEC) 10 MG tablet Take 1 tablet (10 mg total) by mouth daily.    Associated Diagnoses: Other seasonal allergic rhinitis    etonogestrel (NEXPLANON) 68 MG IMPL implant 1 each by Subdermal route once.    fluticasone (FLONASE) 50 MCG/ACT nasal spray Place 2 sprays into both nostrils daily as needed for allergies or rhinitis.    furosemide (LASIX) 20 MG tablet Take 20 mg by mouth daily. For shortness of breath and/or leg swelling.    lamoTRIgine (LAMICTAL) 100 MG tablet Take 100 mg by mouth daily.    metFORMIN (GLUCOPHAGE)  500 MG tablet Take 1 tablet (500 mg total) by mouth 2 (two) times daily with a meal.     potassium chloride SA (K-DUR,KLOR-CON) 20 MEQ tablet Take 20 mEq by mouth daily.    predniSONE (DELTASONE) 10 MG tablet Take 10-20 mg by mouth daily with breakfast. Take 10 mg by mouth daily alternating with 20 mg by mouth daily.      This procedure did not require any contrast per Dr. Lovena Le so no need to hold Metformin.  Disposition   The patient will be discharged in stable condition to home. Discharge Instructions   Diet - low sodium heart healthy    Complete by:  As directed      Increase activity slowly    Complete by:  As directed   No driving for 2 days. No lifting over 5 lbs for 1 week. No sexual activity for 1 week. Keep procedure site clean & dry. If you notice increased pain, swelling, bleeding or pus, call/return!  You may shower, but no soaking baths/hot tubs/pools for 1 week.          Follow-up Information   Follow up with [facility]. (Office will call you for your followup appointment. Call the office if you  have not heard back in 3 days.)    Specialty:  Cardiology   Contact information:   [address], [place] [address] [phone]        Duration of Discharge Encounter: Greater than 30 minutes including physician and PA time.  Signed, Melina Copa PA-C 08/14/2014, 12:35 PM  EP Attending  Patient seen and examined. Agree with above. [place] for discharge.  Mikle Bosworth.D.

## 2014-08-15 LAB — T4, FREE: FREE T4: 1.27 ng/dL (ref 0.80–1.80)

## 2014-08-15 NOTE — Op Note (Signed)
NAMEMarland Vasquez  KLYN, KROENING NO.:  [id number]  MEDICAL RECORD NO.:  [id number]  LOCATION:                                 FACILITY:  PHYSICIAN:  Doylene Canning. Ladona Ridgel, MD    DATE OF BIRTH:  1980/12/25  DATE OF PROCEDURE:  08/14/2014 DATE OF DISCHARGE:  08/14/2014                              OPERATIVE REPORT   PROCEDURE PERFORMED:  Invasive electrophysiologic study.  INDICATION:  Symptomatic SVT with a rapid ventricular response.  INTRODUCTION:  The patient is a 33 year old woman with a history of intermittent heart block who presented to the office several weeks ago with worsening heart failure symptoms and atrial tachycardia at 120 beats per minute.  The patient could not be paced out of her tachycardia.  It was unclear whether her arrhythmia with sinus tachycardia which seemed unlikely or atrial tachycardia.  She had been on medical therapy with beta blockers.  She is now referred for catheter ablation.  Of note, the patient has a longstanding nonischemic cardiomyopathy and chronic systolic heart failure.  The differential diagnosis at that time was worsening heart failure symptoms and sinus tachycardia secondary to this versus an atrial tachycardia which had actually contributed to worsening of her heart failure symptoms.  In addition, the patient has underlying lung disease.  DESCRIPTION OF PROCEDURE:  After informed consent was obtained, the patient was taken to the diagnostic EP lab in a fasting state.  After usual preparation and draping, intravenous fentanyl and Versed were given for sedation.  A 6-French quadripolar catheter was inserted percutaneously in the right femoral vein and advanced to the right ventricle.  A 6-French quadripolar catheter was inserted percutaneously in the right femoral vein and advanced to His bundle region.  A 6-French quadripolar catheter was inserted percutaneously in the right femoral vein and advanced to the right atrium.  After  measurement of basic intervals, rapid ventricular pacing was carried out demonstrating VA dissociation at 600 milliseconds.  Programmed ventricular stimulation demonstrated VA dissociation at 600 milliseconds.  Rapid atrial pacing was carried out demonstrating complete heart block with AV block demonstrated at 600 milliseconds.  Programmed atrial stimulation was carried out at a base drive cycle length of 098 milliseconds demonstrating complete heart block.  At this point, additional rapid atrial pacing was carried out and stepwise decreased down to 210 milliseconds.  There was no inducible arrhythmia.  Programmed atrial stimulation was carried out at a base drive cycle length of 119, 500, and 400 milliseconds.  The S1-S2 interval was stepwise decreased down to atrial refractoriness at 210 milliseconds.  During programmed atrial stimulation, there were no inducible arrhythmias.  Next, additional rapid atrial pacing was carried out at a pacing cycle length of 500 milliseconds.  It was noted that the patient's atrial rate increased from 85 beats per minute to 110 beats per minute.  After careful observation, however, the patient's atrial rate gradually declined back down to 90 beats per minute.  With the above findings, a diagnosis of sinus tachycardia, possible inappropriate sinus tachycardia was made. There was no atrial tachycardia demonstrated.  Because of the patient's underlying cardiomyopathy, decision was made not to use isoproterenol. At this point,  with no inducible SVT and a diagnosis of sinus tachycardia likely inappropriate, the catheters were removed and the patient was returned to the recovery area for sheath removal.  COMPLICATIONS:  There were no immediate procedure complications.  RESULTS:  This demonstrates no inducible SVT.  The patient did have sinus tachycardia, possibly and appropriate.     Doylene Canning. Ladona Ridgel, MD     GWT/MEDQ  D:  08/14/2014  T:  08/15/2014   Job:  [id number]

## 2014-08-16 ENCOUNTER — Telehealth: Payer: Self-pay | Admitting: Physician Assistant

## 2014-08-16 NOTE — Telephone Encounter (Signed)
This patient was recently admitted for a procedure at [facility]. During her brief stay, we checked her thyroid function due to her recently elevated heartrate and it was still pending at time of discharge. Please call her to let her know the thyroid function labs came back normal.  Thank you! Dayna Dunn PA-C

## 2014-08-16 NOTE — Telephone Encounter (Deleted)
Pt is aware that her lab result tyroid function test IS normal t with Dr Ladona Ridgel on 09/01/14/at 12:30 PM . Pt verbalized understanding.

## 2014-08-16 NOTE — Telephone Encounter (Signed)
Pt is aware that thyroid function test done in the hospital is normal. Pt is aware of post hospital  appointment with Dr. Ladona Ridgel on 09/01/14 at 12:30 PM. Pt verbalized understanding.

## 2014-08-21 ENCOUNTER — Encounter (HOSPITAL_COMMUNITY): Payer: Self-pay | Admitting: Physician Assistant

## 2014-08-23 ENCOUNTER — Telehealth: Payer: Self-pay | Admitting: Internal Medicine

## 2014-08-23 NOTE — Telephone Encounter (Signed)
New message      Pt states a nurse was supposed to call her regarding possibly starting a new medication

## 2014-08-23 NOTE — Telephone Encounter (Signed)
Attempt to  Dial pt's phone number given [phone] dial it did not go trough x 3. I also attempted to call [phone] answer service states  "not taking messages"

## 2014-08-24 ENCOUNTER — Ambulatory Visit: Payer: Medicare Other | Admitting: Internal Medicine

## 2014-08-30 NOTE — Telephone Encounter (Signed)
Number can not take incoming calls

## 2014-09-01 ENCOUNTER — Ambulatory Visit (INDEPENDENT_AMBULATORY_CARE_PROVIDER_SITE_OTHER): Payer: Medicare Other | Admitting: Internal Medicine

## 2014-09-01 ENCOUNTER — Encounter: Payer: Self-pay | Admitting: Internal Medicine

## 2014-09-01 ENCOUNTER — Telehealth (HOSPITAL_COMMUNITY): Payer: Self-pay | Admitting: Vascular Surgery

## 2014-09-01 VITALS — BP 102/70 | HR 81 | Ht 66.0 in | Wt 222.6 lb

## 2014-09-01 DIAGNOSIS — J9611 Chronic respiratory failure with hypoxia: Secondary | ICD-10-CM | POA: Diagnosis not present

## 2014-09-01 DIAGNOSIS — Z950 Presence of cardiac pacemaker: Secondary | ICD-10-CM | POA: Diagnosis not present

## 2014-09-01 DIAGNOSIS — I5022 Chronic systolic (congestive) heart failure: Secondary | ICD-10-CM | POA: Diagnosis not present

## 2014-09-01 DIAGNOSIS — I442 Atrioventricular block, complete: Secondary | ICD-10-CM | POA: Diagnosis not present

## 2014-09-01 LAB — MDC_IDC_ENUM_SESS_TYPE_INCLINIC
Battery Remaining Longevity: 52 mo
Battery Voltage: 3 V
Brady Statistic AP VP Percent: 3.35 %
Brady Statistic AS VP Percent: 95.57 %
Brady Statistic AS VS Percent: 1.02 %
Brady Statistic RA Percent Paced: 3.41 %
Brady Statistic RV Percent Paced: 98.92 %
Date Time Interrogation Session: 20151113172926
Lead Channel Impedance Value: 266 Ohm
Lead Channel Impedance Value: 342 Ohm
Lead Channel Impedance Value: 437 Ohm
Lead Channel Impedance Value: 437 Ohm
Lead Channel Impedance Value: 456 Ohm
Lead Channel Impedance Value: 703 Ohm
Lead Channel Pacing Threshold Amplitude: 1 V
Lead Channel Pacing Threshold Amplitude: 1.375 V
Lead Channel Pacing Threshold Pulse Width: 0.4 ms
Lead Channel Pacing Threshold Pulse Width: 0.4 ms
Lead Channel Sensing Intrinsic Amplitude: 5.5 mV
Lead Channel Sensing Intrinsic Amplitude: 5.875 mV
Lead Channel Sensing Intrinsic Amplitude: 7.75 mV
Lead Channel Setting Pacing Amplitude: 2 V
Lead Channel Setting Sensing Sensitivity: 0.9 mV
MDC IDC MSMT LEADCHNL LV IMPEDANCE VALUE: 475 Ohm
MDC IDC MSMT LEADCHNL LV PACING THRESHOLD PULSEWIDTH: 0.4 ms
MDC IDC MSMT LEADCHNL RA IMPEDANCE VALUE: 342 Ohm
MDC IDC MSMT LEADCHNL RA PACING THRESHOLD AMPLITUDE: 0.5 V
MDC IDC MSMT LEADCHNL RV IMPEDANCE VALUE: 266 Ohm
MDC IDC MSMT LEADCHNL RV SENSING INTR AMPL: 5.875 mV
MDC IDC SET LEADCHNL LV PACING PULSEWIDTH: 0.4 ms
MDC IDC SET LEADCHNL RA PACING AMPLITUDE: 2 V
MDC IDC SET LEADCHNL RV PACING AMPLITUDE: 2.5 V
MDC IDC SET LEADCHNL RV PACING PULSEWIDTH: 0.4 ms
MDC IDC SET ZONE DETECTION INTERVAL: 400 ms
MDC IDC STAT BRADY AP VS PERCENT: 0.06 %
Zone Setting Detection Interval: 330 ms

## 2014-09-01 NOTE — Patient Instructions (Signed)
Your physician wants you to follow-up in: 6 months with device clinic and 12 months with Dr. Court Joy will receive a reminder letter in the mail two months in advance. If you don't receive a letter, please call our office to schedule the follow-up appointment.  You have been referred to CHF clinic

## 2014-09-01 NOTE — Assessment & Plan Note (Signed)
Her Medtronic dual-chamber, biventricular pacemaker is working normally. She has not pacing all the time due to her tachycardia. It is unclear to me why her AV conduction has improved. We'll plan to recheck in several months.

## 2014-09-01 NOTE — Assessment & Plan Note (Signed)
Her chronic systolic heart failure symptoms continue to be fairly poorly controlled. At this point, I think she needs heart failure subspecialty care, and I will refer her to her advanced heart failure clinic. She'll continue her current medications.

## 2014-09-01 NOTE — Progress Notes (Signed)
HPI Makayla Vasquez returns today for follow-up. She is a very pleasant 33 year old woman with chronic systolic heart failure, high-grade heart block, lung disease, and is status post biventricular pacemaker insertion. When I saw the patient last several months ago, she had worsening heart failure symptoms, and her ejection fraction was 25%. It was my impression at the time that she was having runs of atrial tachycardia. She underwent electrophysiologic testing which demonstrated only sinus tachycardia. We have been limited in use of beta blocker with the patient because of underlying lung problems. She has not had syncope. She is taking home oxygen. She has very minimal peripheral edema. Her dyspnea is class IIIB. Allergies  Allergen Reactions  . Sertraline Hcl Hives  . Tape Other (See Comments)    Burns skin     Current Outpatient Prescriptions  Medication Sig Dispense Refill  . albuterol (PROVENTIL HFA;VENTOLIN HFA) 108 (90 BASE) MCG/ACT inhaler Inhale 2 puffs into the lungs every 4 (four) hours as needed for wheezing or shortness of breath. 1 each 1  . Azelastine-Fluticasone (DYMISTA) 137-50 MCG/ACT SUSP Place 2 sprays into both nostrils at bedtime.    . budesonide-formoterol (SYMBICORT) 80-4.5 MCG/ACT inhaler Inhale 2 puffs into the lungs 2 (two) times daily as needed (for shortness of breath).     . cetirizine (ZYRTEC) 10 MG tablet Take 1 tablet (10 mg total) by mouth daily. 30 tablet 3  . etonogestrel (NEXPLANON) 68 MG IMPL implant 1 each by Subdermal route once.    . fluticasone (FLONASE) 50 MCG/ACT nasal spray Place 2 sprays into both nostrils daily as needed for allergies or rhinitis.    . furosemide (LASIX) 20 MG tablet Take 20 mg by mouth daily. For shortness of breath and/or leg swelling.    . lamoTRIgine (LAMICTAL) 100 MG tablet Take 100 mg by mouth daily.    . metFORMIN (GLUCOPHAGE) 500 MG tablet Take 1 tablet (500 mg total) by mouth 2 (two) times daily with a meal. 60 tablet 3   . potassium chloride SA (K-DUR,KLOR-CON) 20 MEQ tablet Take 20 mEq by mouth daily.    . predniSONE (DELTASONE) 10 MG tablet Take 10-20 mg by mouth daily with breakfast. Take 10 mg by mouth daily alternating with 20 mg by mouth daily.     No current facility-administered medications for this visit.     Past Medical History  Diagnosis Date  . Cardiac pacemaker     a. Since age 46 in 63. b. Upgrade to BiV in 2013.  . Congenital complete AV block   . Obesity   . GERD (gastroesophageal reflux disease)   . Asymptomatic LV dysfunction     a. Echo in Dec 2011 with EF 35 to 40%. Felt to be due to paced rhythm. b. EF 25-30% in 07/2014.  . Seizures     as a child- from high fever  . Anxiety   . Bipolar affective disorder   . Depression     bipolar  . Carpal tunnel syndrome of right wrist   . Asthma     seasonal allergies   . Arthritis     rheumatoid arthritis- mild, no rheumatology care   . Hypertension   . Pneumonitis     a. a/w hypoxia - inflammatory - large workup 07/2014.  [name] Sinus tachycardia     ROS:   All systems reviewed and negative except as noted in the HPI.   Past Surgical History  Procedure Laterality Date  . Throat surgery  1994    s/p laser treatment  . Cesarean section    . Insert / replace / remove pacemaker      2001  . Cholecystectomy    . Iud removal  11/03/2011    Procedure: INTRAUTERINE DEVICE (IUD) REMOVAL;  Surgeon: Myra C. Marice Potter, MD;  Location: [facility] ORS;  Service: Gynecology;  Laterality: N/A;  . Cyst excision  12/10/2012    THYROID  . Thyroglossal duct cyst N/A 12/10/2012    Procedure: REVISION OF THYROGLOSSAL DUCT CYST EXCISION;  Surgeon: Serena Colonel, MD;  Location[facility];  Service: ENT;  Laterality: N/A;  Revision of Thyroglossal Duct Cyst Excision  . Nasal fracture surgery      /w plate   . Carpal tunnel with cubital tunnel Right 07/26/2013    Procedure: RIGHT LIMITED OPEN CARPAL TUNNEL RELEASE ,  RIGHT CUBITAL TUNNEL RELEASE, INSITU VERSES ULNAR  NERVE DECOMPRESSION AND ANTERIOR TRANSPOSITION;  Surgeon: Dominica Severin, MD;  Location: [facility] OR;  Service: Orthopedics;  Laterality: Right;  . Video bronchoscopy Bilateral 06/19/2014    Procedure: VIDEO BRONCHOSCOPY WITHOUT FLUORO;  Surgeon: Kalman Shan, MD;  Location: [facility] ENDOSCOPY;  Service: Cardiopulmonary;  Laterality: Bilateral;     Family History  Problem Relation Age of Onset  . Heart disease Mother     CHF (no details)  . Hypertension Mother   . Heart disease Father     Murmur  . Heart disease Sister 73     No details.  History of a pacemaker  . Lupus Mother      History   Social History  . Marital Status: Single    Spouse Name: N/A    Number of Children: 1  . Years of Education: N/A   Occupational History  . CNA    Social History Main Topics  . Smoking status: Former Smoker -- 0.25 packs/day for .5 years    Types: Cigarettes    Quit date: 07/25/1996  . Smokeless tobacco: Never Used  . Alcohol Use: No  . Drug Use: No  . Sexual Activity: Yes    Birth Control/ Protection: Implant     Comment: placed 04/2011   Other Topics Concern  . Not on file   Social History Narrative   Works at [employer].       BP 102/70 mmHg  Pulse 81  Ht 5\' 6"  (1.676 m)  Wt 222 lb 9.6 oz (100.971 kg)  BMI 35.95 kg/m2  LMP 06/21/2014  Physical Exam:  Chronically ill appearing 33 year old woman, wearing oxygen by nasal cannula. NAD HEENT: Unremarkable Neck:  No JVD, no thyromegally Lymphatics:  No adenopathy Back:  No CVA tenderness Lungs:  Clear with scattered basilar rales, and scattered expiratory wheezes. HEART:  Regular tachy rhythm, no murmurs, no rubs, no clicks Abd:  soft, positive bowel sounds, no organomegally, no rebound, no guarding Ext:  2 plus pulses, no edema, no cyanosis, no clubbing Skin:  No rashes no nodules Neuro:  CN II through XII intact, motor grossly intact  DEVICE  Normal device function.  See PaceArt for details.   Assess/Plan:

## 2014-09-01 NOTE — Assessment & Plan Note (Signed)
The actual etiology of her symptoms is unclear. She will continue her current medical therapy, including home oxygen.

## 2014-09-18 ENCOUNTER — Ambulatory Visit (HOSPITAL_COMMUNITY)
Admission: RE | Admit: 2014-09-18 | Discharge: 2014-09-18 | Disposition: A | Discharge status: Home / Self Care | Payer: Medicare Other | Source: Ambulatory Visit | Attending: Internal Medicine | Admitting: Internal Medicine

## 2014-09-18 VITALS — BP 122/78 | HR 112 | Wt 226.5 lb

## 2014-09-18 DIAGNOSIS — Z87891 Personal history of nicotine dependence: Secondary | ICD-10-CM | POA: Insufficient documentation

## 2014-09-18 DIAGNOSIS — R918 Other nonspecific abnormal finding of lung field: Secondary | ICD-10-CM

## 2014-09-18 DIAGNOSIS — E669 Obesity, unspecified: Secondary | ICD-10-CM | POA: Insufficient documentation

## 2014-09-18 DIAGNOSIS — J849 Interstitial pulmonary disease, unspecified: Secondary | ICD-10-CM

## 2014-09-18 DIAGNOSIS — R0602 Shortness of breath: Secondary | ICD-10-CM | POA: Diagnosis not present

## 2014-09-18 DIAGNOSIS — Q246 Congenital heart block: Secondary | ICD-10-CM | POA: Insufficient documentation

## 2014-09-18 DIAGNOSIS — E119 Type 2 diabetes mellitus without complications: Secondary | ICD-10-CM | POA: Insufficient documentation

## 2014-09-18 DIAGNOSIS — F419 Anxiety disorder, unspecified: Secondary | ICD-10-CM | POA: Diagnosis not present

## 2014-09-18 DIAGNOSIS — J9610 Chronic respiratory failure, unspecified whether with hypoxia or hypercapnia: Secondary | ICD-10-CM | POA: Insufficient documentation

## 2014-09-18 DIAGNOSIS — K219 Gastro-esophageal reflux disease without esophagitis: Secondary | ICD-10-CM | POA: Diagnosis not present

## 2014-09-18 DIAGNOSIS — I442 Atrioventricular block, complete: Secondary | ICD-10-CM | POA: Insufficient documentation

## 2014-09-18 DIAGNOSIS — M069 Rheumatoid arthritis, unspecified: Secondary | ICD-10-CM | POA: Insufficient documentation

## 2014-09-18 DIAGNOSIS — Z950 Presence of cardiac pacemaker: Secondary | ICD-10-CM | POA: Diagnosis not present

## 2014-09-18 DIAGNOSIS — I10 Essential (primary) hypertension: Secondary | ICD-10-CM | POA: Diagnosis not present

## 2014-09-18 DIAGNOSIS — R0600 Dyspnea, unspecified: Secondary | ICD-10-CM

## 2014-09-18 DIAGNOSIS — I5022 Chronic systolic (congestive) heart failure: Secondary | ICD-10-CM | POA: Diagnosis not present

## 2014-09-18 DIAGNOSIS — J302 Other seasonal allergic rhinitis: Secondary | ICD-10-CM | POA: Insufficient documentation

## 2014-09-18 DIAGNOSIS — J45909 Unspecified asthma, uncomplicated: Secondary | ICD-10-CM | POA: Insufficient documentation

## 2014-09-18 DIAGNOSIS — J189 Pneumonia, unspecified organism: Secondary | ICD-10-CM | POA: Diagnosis not present

## 2014-09-18 DIAGNOSIS — R000 Tachycardia, unspecified: Secondary | ICD-10-CM | POA: Diagnosis not present

## 2014-09-18 DIAGNOSIS — Z79899 Other long term (current) drug therapy: Secondary | ICD-10-CM | POA: Diagnosis not present

## 2014-09-18 DIAGNOSIS — F319 Bipolar disorder, unspecified: Secondary | ICD-10-CM | POA: Diagnosis not present

## 2014-09-18 DIAGNOSIS — I471 Supraventricular tachycardia: Secondary | ICD-10-CM | POA: Diagnosis not present

## 2014-09-18 DIAGNOSIS — Z7951 Long term (current) use of inhaled steroids: Secondary | ICD-10-CM | POA: Diagnosis not present

## 2014-09-18 MED ORDER — BISOPROLOL FUMARATE 5 MG PO TABS: 2.5000 mg | ORAL_TABLET | Freq: Every day | ORAL | Status: DC

## 2014-09-18 MED ORDER — LOSARTAN POTASSIUM 25 MG PO TABS: 25.0000 mg | ORAL_TABLET | Freq: Every day | ORAL | Status: DC

## 2014-09-18 NOTE — Patient Instructions (Signed)
Start Bisoprolol 2.5 mg daily  Start Losartan 25 mg daily  Chest x-ray today  Your physician has recommended that you have a pulmonary function test. Pulmonary Function Tests are a group of tests that measure how well air moves in and out of your lungs.  Your physician recommends that you schedule a follow-up appointment in: 2 weeks with Dr Gala Romney

## 2014-09-18 NOTE — Progress Notes (Signed)
Patient ID: Makayla Vasquez, female   DOB: [birth year], 33 y.o.   MRN: [id number] Referring Physician: Primary Care: Primary Cardiologist:  HPI:  Makayla Vasquez is a 33 year old woman with h/o obesity. 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 Ladona Ridgel for HF evaluation.   She has a h/o congenital HB and underwent first PPM at age 56. In 2011 had ECHO with EF 35-40%. LV dysfunction felt to be due to RV pacing so upgraded to CRT-P. Recent echo in 10/15 with EF 25%. She has had worsening HF symptoms and referred for evaluation for advanced HF therapies. She also has concomitant lung disease and this has limited b-blocker dosing.  She follows with Dr. Maple Hudson for her lung disease. She is a former smoker.  Admitted in 8/15 and 9/15 with worsening respiratory status and severe hypoxia. Had extensive w/u.  8/11-8/14  thought to have a viral pneumonitis complicated by atelectasis and mild edema vs. an autoimmune process. Discharged home on a course of levaquin and prednisone.  Readmitted 06/16/14.  06/16/14 CT scan showed bilateral progressive lower peribronchial infiltrates with groundglass changes and nodular patter consistent with acute pneumonitis of unclear etiology.  06/20/14 BAL showed 60% macrophages and 30% PMNs. Her AFB smear, fungal smear, legionella Ab, pneumocystis smear, ACE, and sputum culture are negative. Her AFB, legionella, fungal, and BAL culture as well as her fungal Ab, hypersensitivity pneumonitis panel were all negative Serology: DsDNA, RF, ANCA, HSP all negative  She was offered a lung biopsy for further diagnosis versus empiric treatment with steroids. 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  She was discharged on prednisone  50mg  and home oxygen. Saw Dr. Maple Hudson on 06/27/14. CXR with persistent infiltrates of unclear etiology. Prednisone weaned down now taking 20mg  one day and 10mg  the next.  Recently had EP study for persistent tachycardia and found to have sinus tach.   Says she is very tired and depressed and sleeps most of the day. Can do ADLs but it is limitedd due to DOE and tachycardia. Wears O2 at all times. Denies swelling or dizziness. + gaining weight. + cough with clear sputum occasionally green. No wheezing.  Husband says she snores a lot. Not taking any HF meds currently. Used to take carvedilol but stopped due to hypotension. Overall she feels like she is improving.     Review of Systems: [y] = yes, [ ]  = no   General: Weight gain Cove.Etienne ]; Weight loss [ ] ; Anorexia [ ] ; Fatigue Cove.Etienne ]; Fever [ ] ; Chills [ ] ; Weakness [ ]   Cardiac: Chest pain/pressure [ ] ; Resting SOB [ ] ; Exertional SOB [ y]; Orthopnea [ ] ; Pedal Edema [ ] ; Palpitations [ ] ; Syncope [ ] ; Presyncope [ ] ; Paroxysmal nocturnal dyspnea[ ]   Pulmonary: Cough Cove.Etienne ]; Wheezing[ ] ; Hemoptysis[ ] ; Sputum [ ] ; Snoring Cove.Etienne ]  GI: Vomiting[ ] ; Dysphagia[ ] ; Melena[ ] ; Hematochezia [ ] ; Heartburn[ ] ; Abdominal pain [ ] ; Constipation [ ] ; Diarrhea [ ] ; BRBPR [ ]   GU: Hematuria[ ] ; Dysuria [ ] ; Nocturia[ ]   Vascular: Pain in legs with walking [ ] ; Pain in feet with lying flat [ ] ; Non-healing sores [ ] ; Stroke [ ] ; TIA [ ] ; Slurred speech [ ] ;  Neuro: Headaches[ ] ; Vertigo[ ] ;  Seizures[ ] ; Paresthesias[ ] ;Blurred vision [ ] ; Diplopia [ ] ; Vision changes [ ]   Ortho/Skin: Arthritis [ ] ; Joint pain ]; Muscle pain [ ] ; Joint swelling [ ] ; Back Pain [ ] ; Rash [ ]   Psych: Depression[ ] ; Anxiety[ ]   Heme: Bleeding problems [ ] ; Clotting disorders [ ] ; Anemia [ ]   Endocrine: Diabetes [ ] ; Thyroid dysfunction[ ]    Past Medical History  Diagnosis Date  . Cardiac pacemaker     a. Since age 33 in 101. b. Upgrade to BiV in 2013.  . Congenital complete AV block   .  Obesity   . GERD (gastroesophageal reflux disease)   . Asymptomatic LV dysfunction     a. Echo in Dec 2011 with EF 35 to 40%. Felt to be due to paced rhythm. b. EF 25-30% in 07/2014.  . Seizures     as a child- from high fever  . Anxiety   . Bipolar affective disorder   . Depression     bipolar  . Carpal tunnel syndrome of right wrist   . Asthma     seasonal allergies   . Arthritis     rheumatoid arthritis- mild, no rheumatology care   . Hypertension   . Pneumonitis     a. a/w hypoxia - inflammatory - large workup 07/2014.  Sinus tachycardia     Current Outpatient Prescriptions  Medication Sig Dispense Refill  . albuterol (PROVENTIL HFA;VENTOLIN HFA) 108 (90 BASE) MCG/ACT inhaler Inhale 2 puffs into the lungs every 4 (four) hours as needed for wheezing or shortness of breath. 1 each 1  . Azelastine-Fluticasone (DYMISTA) 137-50 MCG/ACT SUSP Place 2 sprays into both nostrils at bedtime.    . budesonide-formoterol (SYMBICORT) 80-4.5 MCG/ACT inhaler Inhale 2 puffs into the lungs 2 (two) times daily as needed (for shortness of breath).     . cetirizine (ZYRTEC) 10 MG tablet Take 1 tablet (10 mg total) by mouth daily. 30 tablet 3  . etonogestrel (NEXPLANON) 68 MG IMPL implant 1 each by Subdermal route once.    . fluticasone (FLONASE) 50 MCG/ACT nasal spray Place 2 sprays into both nostrils daily as needed for allergies or rhinitis.    . furosemide (LASIX) 20 MG tablet Take 20 mg by mouth daily. For shortness of breath and/or leg swelling.    . lamoTRIgine (LAMICTAL) 100 MG tablet Take 100 mg by mouth daily.    . metFORMIN (GLUCOPHAGE) 500 MG tablet Take 1 tablet (500 mg total) by mouth 2 (two) times daily with a meal. 60 tablet 3  . potassium chloride SA (K-DUR,KLOR-CON) 20 MEQ tablet Take 20 mEq by mouth daily.    . predniSONE (DELTASONE) 10 MG tablet Take 10-20 mg by mouth daily with breakfast. Take 10 mg by mouth daily alternating with 20 mg by mouth daily.     No current  facility-administered medications for this encounter.    Allergies  Allergen Reactions  . Sertraline Hcl Hives  . Tape Other (See Comments)    Burns skin      History   Social History  . Marital Status: Single    Spouse Name: N/A    Number of Children: 1  . Years of Education: N/A   Occupational History  . CNA    Social History Main Topics  . Smoking status: Former Smoker -- 0.25 packs/day for .5 years    Types: Cigarettes    Quit date: 07/25/1996  . Smokeless tobacco: Never Used  . Alcohol Use: No  .  Drug Use: No  . Sexual Activity: Yes    Birth Control/ Protection: Implant     Comment: placed 04/2011   Other Topics Concern  . Not on file   Social History Narrative   Works at [employer].        Family History  Problem Relation Age of Onset  . Heart disease Mother     CHF (no details)  . Hypertension Mother   . Heart disease Father     Murmur  . Heart disease Sister 41     No details.  History of a pacemaker  . Lupus Mother     Ceasar Mons Vitals:   09/18/14 1155  BP: 122/78  Pulse: 112  Weight: 226 lb 8 oz (102.74 kg)  SpO2: 96%    PHYSICAL EXAM: General:  Obese young woman wearing O2. No respiratory difficulty HEENT: normal Neck: supple. JVP 7 Carotids 2+ bilat; no bruits. No lymphadenopathy or thryomegaly appreciated. Cor: PMI nonpalpable. Tachy regular. No rubs, gallops or murmurs. Lungs: clear Abdomen: obese soft, nontender, nondistended. No hepatosplenomegaly. No bruits or masses. Good bowel sounds. Extremities: no cyanosis, clubbing, rash, tr edema Neuro: alert & oriented x 3, cranial nerves grossly intact. moves all 4 extremities w/o difficulty. Affect pleasant.  ASSESSMENT & PLAN: 1) Chronic systolic HF EF 20-25% 2) Chronic respiratory failure with diffuse lung infiltrates of unknown etiology 3) Congenital AVN block s/p pacer 4) Sinus tachycardia 5) Diabetes  Complicated situation. She has NYHA III-IIIB symptoms in setting of systolic CHF  with worsening LV function and persistent lung infiltrates. Lung infiltrates seem out of proportion to volume status and I worry about concomitant parenchymal lung disease or connective tissue disorder. On exam today, however, her lungs sound remarkably clear. Will repeat CXR today and if infiltrates persist will likely need RHC (and probably left heart cath) to evaluute filling pressures and probable VATS lung biopsy to help sort out etiology. Will also get PFTs.  Needs to start back on HF therapies. Will start losartan 25 daily and bisoprolol 2.5 daily. Given her low EF and tachycardia, ivabradine may also be helpful.   With Class III HF, will likely need to d/c metformin and I will send a copy of this note to her PCP.   I will see back in 2 weeks. I have discussed with Dr. Maple Hudson  Total time spent 60 minutes. Over half that time spent discussing above.    Makayla Winkles,MD 2:14 PM

## 2014-09-21 ENCOUNTER — Other Ambulatory Visit: Payer: Self-pay | Admitting: Family Medicine

## 2014-09-21 NOTE — Telephone Encounter (Signed)
Needs refill on lasix, Pt [facility] to [facility] [address]

## 2014-09-22 MED ORDER — FUROSEMIDE 20 MG PO TABS: 20.0000 mg | ORAL_TABLET | Freq: Every day | ORAL | Status: DC

## 2014-09-22 NOTE — Telephone Encounter (Signed)
Spoke with patient and informed her that rx has been sent in 

## 2014-09-26 ENCOUNTER — Ambulatory Visit (HOSPITAL_COMMUNITY)
Admission: RE | Admit: 2014-09-26 | Discharge: 2014-09-26 | Disposition: A | Discharge status: Home / Self Care | Payer: Medicare Other | Source: Ambulatory Visit | Attending: Internal Medicine | Admitting: Internal Medicine

## 2014-09-26 DIAGNOSIS — R0600 Dyspnea, unspecified: Secondary | ICD-10-CM | POA: Diagnosis not present

## 2014-09-26 DIAGNOSIS — J849 Interstitial pulmonary disease, unspecified: Secondary | ICD-10-CM | POA: Insufficient documentation

## 2014-09-26 LAB — PULMONARY FUNCTION TEST
DL/VA % pred: 70 %
DL/VA: 3.61 ml/min/mmHg/L
DLCO COR: 9.24 ml/min/mmHg
DLCO cor % pred: 32 %
DLCO unc % pred: 32 %
DLCO unc: 9.24 ml/min/mmHg
FEF 25-75 PRE: 3.21 L/s
FEF 25-75 Post: 3.4 L/sec
FEF2575-%Change-Post: 5 %
FEF2575-%Pred-Post: 104 %
FEF2575-%Pred-Pre: 98 %
FEV1-%Change-Post: 2 %
FEV1-%PRED-POST: 53 %
FEV1-%Pred-Pre: 51 %
FEV1-Post: 1.56 L
FEV1-Pre: 1.52 L
FEV1FVC-%Change-Post: 0 %
FEV1FVC-%Pred-Pre: 110 %
FEV6-%Change-Post: 3 %
FEV6-%PRED-PRE: 46 %
FEV6-%Pred-Post: 47 %
FEV6-POST: 1.65 L
FEV6-Pre: 1.6 L
FEV6FVC-%Pred-Post: 101 %
FEV6FVC-%Pred-Pre: 101 %
FVC-%Change-Post: 2 %
FVC-%Pred-Post: 47 %
FVC-%Pred-Pre: 45 %
FVC-Post: 1.65 L
FVC-Pre: 1.61 L
Post FEV1/FVC ratio: 94 %
Post FEV6/FVC ratio: 100 %
Pre FEV1/FVC ratio: 94 %
Pre FEV6/FVC Ratio: 100 %
RV % pred: 117 %
RV: 1.91 L
TLC % PRED: 64 %
TLC: 3.52 L

## 2014-09-26 MED ORDER — ALBUTEROL SULFATE (2.5 MG/3ML) 0.083% IN NEBU
2.5000 mg | INHALATION_SOLUTION | Freq: Once | RESPIRATORY_TRACT | Status: AC
  Administered 2014-09-26: 2.5 mg via RESPIRATORY_TRACT

## 2014-09-28 ENCOUNTER — Ambulatory Visit (HOSPITAL_COMMUNITY)
Admission: RE | Admit: 2014-09-28 | Discharge: 2014-09-28 | Disposition: A | Discharge status: Home / Self Care | Payer: Medicare Other | Source: Ambulatory Visit | Attending: Internal Medicine | Admitting: Internal Medicine

## 2014-09-28 ENCOUNTER — Encounter (HOSPITAL_COMMUNITY): Payer: Self-pay | Admitting: Internal Medicine

## 2014-09-28 VITALS — BP 102/58 | HR 68 | Wt 224.2 lb

## 2014-09-28 DIAGNOSIS — Z79899 Other long term (current) drug therapy: Secondary | ICD-10-CM | POA: Insufficient documentation

## 2014-09-28 DIAGNOSIS — Z87891 Personal history of nicotine dependence: Secondary | ICD-10-CM | POA: Diagnosis not present

## 2014-09-28 DIAGNOSIS — I429 Cardiomyopathy, unspecified: Secondary | ICD-10-CM | POA: Insufficient documentation

## 2014-09-28 DIAGNOSIS — E669 Obesity, unspecified: Secondary | ICD-10-CM | POA: Insufficient documentation

## 2014-09-28 DIAGNOSIS — Q246 Congenital heart block: Secondary | ICD-10-CM | POA: Diagnosis not present

## 2014-09-28 DIAGNOSIS — E119 Type 2 diabetes mellitus without complications: Secondary | ICD-10-CM | POA: Diagnosis not present

## 2014-09-28 DIAGNOSIS — R000 Tachycardia, unspecified: Secondary | ICD-10-CM | POA: Insufficient documentation

## 2014-09-28 DIAGNOSIS — J9610 Chronic respiratory failure, unspecified whether with hypoxia or hypercapnia: Secondary | ICD-10-CM | POA: Insufficient documentation

## 2014-09-28 DIAGNOSIS — M329 Systemic lupus erythematosus, unspecified: Secondary | ICD-10-CM | POA: Diagnosis not present

## 2014-09-28 DIAGNOSIS — M879 Osteonecrosis, unspecified: Secondary | ICD-10-CM | POA: Diagnosis not present

## 2014-09-28 DIAGNOSIS — Z950 Presence of cardiac pacemaker: Secondary | ICD-10-CM

## 2014-09-28 DIAGNOSIS — I5022 Chronic systolic (congestive) heart failure: Secondary | ICD-10-CM | POA: Insufficient documentation

## 2014-09-28 MED ORDER — LOSARTAN POTASSIUM 25 MG PO TABS: 25.0000 mg | ORAL_TABLET | Freq: Two times a day (BID) | ORAL | Status: DC

## 2014-09-28 MED ORDER — SPIRONOLACTONE 25 MG PO TABS: 12.5000 mg | ORAL_TABLET | Freq: Every day | ORAL | Status: DC

## 2014-09-28 NOTE — Patient Instructions (Signed)
Increase Losartan to 25 mg Twice daily   Start Spironolactone 1/2 tab (12.5 mg) daily  Labs in 2 weeks (bmet)  Your physician recommends that you schedule a follow-up appointment in: 2 months

## 2014-09-28 NOTE — Progress Notes (Signed)
Patient ID: Makayla Vasquez, female   DOB: 1981-07-25, 32 y.o.   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 Ladona Ridgel for HF evaluation.   She has a h/o congenital HB and underwent first PPM at age 65. In 2011 had ECHO with EF 35-40%. LV dysfunction felt to be due to RV pacing so upgraded to CRT-P. Recent echo in 10/15 with EF 25%. She has had worsening HF symptoms and referred for evaluation for advanced HF therapies. She also has concomitant lung disease and this has limited b-blocker dosing.  She follows with Dr. Maple Hudson for her lung disease. She is a former smoker.  Admitted in 8/15 and 9/15 with worsening respiratory status and severe hypoxia. Had extensive w/u. Thought to have a viral pneumonitis complicated by atelectasis and mild edema vs. an autoimmune process. Discharged home on a course of levaquin and prednisone.   06/16/14 CT scan showed bilateral progressive lower peribronchial infiltrates with groundglass changes and nodular patter consistent with acute pneumonitis of unclear etiology.  06/20/14 BAL showed 60% macrophages and 30% PMNs. Her AFB smear, fungal smear, legionella Ab, pneumocystis smear, ACE, and sputum culture are negative. Her AFB, legionella, fungal, and BAL culture as well as her fungal Ab, hypersensitivity pneumonitis panel were all negative Serology: DsDNA, RF, ANCA, HSP all negative  She was offered a lung biopsy for further diagnosis versus empiric treatment with steroids. 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  Saw Dr. Maple Hudson on 06/27/14. CXR with persistent infiltrates of unclear etiology. Prednisone weaned down now taking  20mg  one day and 10mg  the next.  Recently had EP study for persistent tachycardia and found to have sinus tach.   She is wearing oxygen by nasal cannula all the time.  She is not short of breath walking on flat ground and can walk up a flight of steps without problems.  No chest pain.  Overall, breathing is better.  Weight is down 2 lbs.  No orthopnea or PND.    PFTs (12/15) showed a severe restrictive defect concerning for an interstitial process.   Labs (10/15): TSH normal K 3.6, creatinine 1.1  Review of Systems: All systems reviewed and negative except as per HPI.   Past Medical History  Diagnosis Date  . Cardiac pacemaker     a. Since age 11 in 80. b. Upgrade to BiV in 2013.  . Congenital complete AV block   . Obesity   . GERD (gastroesophageal reflux disease)   . Asymptomatic LV dysfunction     a. Echo in Dec 2011 with EF 35 to 40%. Felt to be due to paced rhythm. b. EF 25-30% in 07/2014.  . Seizures     as a child- from high fever  . Anxiety   . Bipolar affective disorder   . Depression     bipolar  . Carpal tunnel syndrome of right wrist   . Asthma     seasonal allergies   . Arthritis     rheumatoid arthritis- mild, no rheumatology care   . Hypertension   . Pneumonitis     a. a/w hypoxia - inflammatory - large workup 07/2014.  08/2014 Sinus tachycardia  Current Outpatient Prescriptions  Medication Sig Dispense Refill  . albuterol (PROVENTIL HFA;VENTOLIN HFA) 108 (90 BASE) MCG/ACT inhaler Inhale 2 puffs into the lungs every 4 (four) hours as needed for wheezing or shortness of breath. 1 each 1  . Azelastine-Fluticasone (DYMISTA) 137-50 MCG/ACT SUSP Place 2 sprays into both nostrils at bedtime.    . bisoprolol (ZEBETA) 5 MG tablet Take 0.5 tablets (2.5 mg total) by mouth daily. 15 tablet 3  . budesonide-formoterol (SYMBICORT) 80-4.5 MCG/ACT inhaler Inhale 2 puffs into the lungs 2 (two) times daily as needed (for shortness of breath).     . cetirizine (ZYRTEC) 10 MG  tablet Take 1 tablet (10 mg total) by mouth daily. 30 tablet 3  . etonogestrel (NEXPLANON) 68 MG IMPL implant 1 each by Subdermal route once.    . fluticasone (FLONASE) 50 MCG/ACT nasal spray Place 2 sprays into both nostrils daily as needed for allergies or rhinitis.    . furosemide (LASIX) 20 MG tablet Take 1 tablet (20 mg total) by mouth daily. For shortness of breath and/or leg swelling. 30 tablet 0  . lamoTRIgine (LAMICTAL) 100 MG tablet Take 100 mg by mouth daily.    [name] losartan (COZAAR) 25 MG tablet Take 1 tablet (25 mg total) by mouth 2 (two) times daily. 60 tablet 3  . metFORMIN (GLUCOPHAGE) 500 MG tablet Take 1 tablet (500 mg total) by mouth 2 (two) times daily with a meal. 60 tablet 3  . potassium chloride SA (K-DUR,KLOR-CON) 20 MEQ tablet Take 20 mEq by mouth daily.    . predniSONE (DELTASONE) 10 MG tablet Take 10-20 mg by mouth daily with breakfast. Take 10 mg by mouth daily alternating with 20 mg by mouth daily.    [name] spironolactone (ALDACTONE) 25 MG tablet Take 0.5 tablets (12.5 mg total) by mouth daily. 15 tablet 3   No current facility-administered medications for this encounter.    Allergies  Allergen Reactions  . Sertraline Hcl Hives  . Tape Other (See Comments)    Burns skin      History   Social History  . Marital Status: Single    Spouse Name: N/A    Number of Children: 1  . Years of Education: N/A   Occupational History  . CNA    Social History Main Topics  . Smoking status: Former Smoker -- 0.25 packs/day for .5 years    Types: Cigarettes    Quit date: 07/25/1996  . Smokeless tobacco: Never Used  . Alcohol Use: No  . Drug Use: No  . Sexual Activity: Yes    Birth Control/ Protection: Implant     Comment: placed 04/2011   Other Topics Concern  . Not on file   Social History Narrative   Works at [employer].        Family History  Problem Relation Age of Onset  . Heart disease Mother     CHF (no details)  . Hypertension Mother   . Heart disease  Father     Murmur  . Heart disease Sister 50     No details.  History of a pacemaker  . Lupus Mother     Ceasar Mons Vitals:   09/28/14 1527  BP: 102/58  Pulse: 68  Weight: 224 lb 4 oz (101.719 kg)  SpO2: 94%    PHYSICAL EXAM: General:  Obese young woman wearing O2. No respiratory difficulty HEENT: normal Neck: supple. JVP 7 Carotids 2+ bilat; no bruits. No lymphadenopathy or thryomegaly appreciated. Cor: PMI nonpalpable. Tachy regular.  No rubs, gallops or murmurs. Lungs: clear Abdomen: obese soft, nontender, nondistended. No hepatosplenomegaly. No bruits or masses. Good bowel sounds. Extremities: no cyanosis, clubbing, rash, no edema Neuro: alert & oriented x 3, cranial nerves grossly intact. moves all 4 extremities w/o difficulty. Affect pleasant.  ASSESSMENT & PLAN: 1) Chronic systolic HF: EF 20-25% on last echo.  Nonischemic cardiomyopathy.  St Jude CRT-P device.  NYHA class II symptoms.  Volume status looks ok.  - Continue bisoprolol. It may be difficult to tolerate uptitration of this with significant lung disease. Ivabradine would be an option as well.  - Increase losartan to 25 mg bid.   - Add spironolactone 12.5 mg daily.  - She will need eventual CPX (consider after next appointment).   - BMET in 2 wks.   - Repeat echo in 4 months after aggressive medication titration. If it remains < 35%, would consider upgrading to defibrillator. 2) Chronic respiratory failure with diffuse lung infiltrates of unknown etiology:  Lung infiltrates seem out of proportion to volume status and there is concern for concomitant parenchymal lung disease or connective tissue disorder. PFTs showed a severe restrictive defect concerning for an interstitial process.  She will need followup with pulmonary.  3) Congenital AVN block s/p pacer 4) Sinus tachycardia 5) Diabetes  Desarai Barrack,MD 10:27 PM

## 2014-10-09 ENCOUNTER — Encounter: Payer: Self-pay | Admitting: Internal Medicine

## 2014-10-09 ENCOUNTER — Ambulatory Visit (INDEPENDENT_AMBULATORY_CARE_PROVIDER_SITE_OTHER): Payer: Medicare Other | Admitting: Internal Medicine

## 2014-10-09 VITALS — BP 118/84 | HR 109 | Ht 65.0 in | Wt 229.2 lb

## 2014-10-09 DIAGNOSIS — J9611 Chronic respiratory failure with hypoxia: Secondary | ICD-10-CM

## 2014-10-09 DIAGNOSIS — I420 Dilated cardiomyopathy: Secondary | ICD-10-CM

## 2014-10-09 NOTE — Patient Instructions (Signed)
Order- [facility]- evaluate for portable O2 concentrator 4L pulse  Dx chronic respiratory failure with hypoxia  Reduce prednisone to 10 mg daily  Please call as needed

## 2014-10-09 NOTE — Progress Notes (Signed)
02/23/14- 36 yoF former smoker Referred by Dr Bridgett Larsson for allergy consult.  C/o itchy, watery eyes, sinus congestion, headaches, prodcough with green mucous X several years. Mother and son are here She had been going to allergist at [facility] for allergic rhinitis, itching eyes and chest tightness for years as an adult. Typically worse in spring and summer, with occasional colds and with exposure to house dust. Daily wheeze.She tried allergy shots for 2 months, 2 years ago but had strong local reactions. 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 Mother has hx allergies. CXR 5/ 8 /14 IMPRESSION:  No active disease. No significant change.  Original Report Authenticated By: Lahoma Crocker, M.D.  04/06/14- 67 yoF former smoker Referred by Dr Bridgett Larsson for allergy consult.  C/o itchy, watery eyes, sinus congestion, headaches, prodcough with green mucous X several years. Mother and son are here FOLLOWS FOR: Allergies have been doing well until outdoors with trees,etc. Allergy profile 02/23/2014-negative with total IgE 22.9. She does give history of allergy shots in the past, stopped because of large local reactions. Past history suggests stronger allergic reactions when she was younger. We can retest her if there is a question She still reports some itching in her throat and cough sometimes green sputum  06/27/14 [facility] follow up  Returns for follow up from recent hospital stay.  Reports breathing is doing well today. Is suppose to be on O2 at 4lm however did not bring O2 b/c   portable tank at home is malfunctioned. DME contacted and at home to fix system. Advised on importance of O2 use.  Says were very low off Oxygen - sats 54%-83% on room air unpon entering the exam room. Sats increased 90% on 4l/m  Admitted last week for significant hypoxia   Patient presented with hypoxemia of unknown etiology, requiring 4L of oxygen. Notably, she was hospitalized from 8/11-8/14 with a  similar presentation and was thought to have a viral pneumonitis complicated by atelectasis and mild edema vs. an autoimmune process and was discharged home on a course of levaquin and prednisone.  Her CT scan from 8/28 showed bilateral progressive lower peribronchial infiltrates with groundglass changes and nodular patter consistent with acute pneumonitis of unclear etiology. During her last admission, she was found to have a positive ANA, mildly elevated ESR, non-reactive HIV, and negative PPD. Also during her last hospitalization, cardiology saw her for a repeat echo that showed a decrease in EF (25-30%, from baseline 30-25% in 2013).  Viral panel was positive for rhinovirus previous admission but neg for this admit .  ESR was elevated 62 , decreased today at 55.  Pulmonology was consulted and did a BAL on 9/1 that showed 60% macrophages and 30% PMNs. Thus far, her AFB smear, fungal smear, legionella Ab, pneumocystis smear, ACE, and sputum culture are negative. Fungal neg , bal cx neg w/ non path oral flora , legionella neg  Has family hx of Lupus.  Anti smith ab neg and anti dsDNA neg  HSP panel was neg.  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 Status: Edited Related Problem: ILD (interstitial lung disease)    #1 bilateral progressive lower lobe peribronchial infiltrates with groundglass changes and nodular pattern consistent with acute pneumonitis of unclear etiology.  Note previous serologic studies show a speckled positive ANA 1-80 pattern and no evidence of  allergy factors or elevated IgE. This inflammatory pneumonitis is not yet discerned as to etiology therefore bronchoscopy would be indicated and will be pursued  Previous viral panel + for rhinovirus  BAL cx neg to date.  HSP neg .  Will cont on steroid challenge with close  follow up  Case discussed with Dr. Annamaria Boots  Clinically she appears stable , will have her back in office with follow up cxr in 1 week as cxr w/ worse aeration today  Plan  Taper Prednisone $RemoveBeforeDE'40mg'lRmyCZSYBIgfkTb$  daily and hold at this dose.  Continue on Oxygen 4l/m continuous flow 24/7 .  Labs and chest xray today .  Follow up Dr. Annamaria Boots In 1 weeks and As needed  Please contact office for sooner follow up if symptoms do not improve or worsen or seek emergency care    07/06/14-32 yoF former smoker originaly referred for allergy evaluation, rhinitis, asthmatic bronchitis, then hosp for ?ALI with lower zone pneumonitis/ BAL and special studies NEG,. Acute on Chronic respiratory failure. Pacemaker for AV block  O2 4L/ Advanced FOLLOWS FOR: Pt c/o DOE, prod cough with clear mucus and chest tightness when coughing. Pt last saw TP on 06/27/2014 for HFU .   08/07/14- 33 yoF former smoker originaly referred for allergy evaluation, rhinitis, asthmatic bronchitis, then hosp for ?ALI with lower zone pneumonitis/ BAL and special studies NEG,. Acute on Chronic respiratory failure. Pacemaker for AV block  O2 4L/ Advanced      Husband and son are here Continues oxygen but feels comfortable at rest taking it off for short intervals. As instructed, prednisone is now down to 20 mg daily for the last 2 weeks. She feels stable, not worse. Coughs a lot, clear mucus. Denies fever, blood, purulent sputum, edema, adenopathy. Pending cardiac ablation seeking better control of heart rate. CXR 07/06/14 IMPRESSION:  1. No change in aeration to the lungs compared with the previous  exam.  Electronically Signed  By: Kerby Moors M.D.  On: 07/06/2014 15:56  10/08/14-  42 yoF former smoker originaly referred for allergy evaluation, rhinitis, asthmatic bronchitis, then hosp for ?ALI with lower zone pneumonitis/ BAL and special studies NEG,. Acute on Chronic respiratory failure. Pacemaker for AV block  O2 4L/ Advanced. Has continued prednisone 20 mg  daily. FOLLOW FOR:  hypoxemia; breathing doing fine, wants to get off oxygen; no complaints  Arrived 78% on 2L, 90% on 4L.[name]  Husband and son here. Cough with clear phlegm. Feels better with no acute events. CXR 09/18/14  IMPRESSION: Bibasilar pulmonary infiltrates, improved from 08/07/2014. Electronically Signed  By: Marcello Moores Register  On: 09/18/2014 13:35   ROS-see HPI Constitutional:   No-   weight loss, night sweats, fevers, chills, fatigue, lassitude. HEENT:   , difficulty swallowing, tooth/dental problems, sore throat,       +sneezing, +itching, ear ache, +nasal congestion, post nasal drip,  CV:  No-   chest pain, orthopnea, PND, swelling in lower extremities, anasarca,                                  dizziness, palpitations Resp: No-   shortness of breath with exertion or at rest.              +productive cough,  No non-productive cough,  No- coughing up of blood.               No- wheezing.   Skin: No-   rash or  lesions. GI:  + heartburn, +indigestion, abdominal pain, nausea, vomiting,  GU:  MS:  No-   joint pain or swelling.   Neuro-     nothing unusual Psych:  No- change in mood or affect. No depression or anxiety.  No memory loss.  OBJ- Physical Exam   O2 3 L/95% General- Alert, Oriented, Affect-appropriate, Distress- none acute, + wheelchair Skin- rash-none, lesions- none, excoriation- none Lymphadenopathy- none Head- atraumatic            Eyes- Gross vision intact, PERRLA, conjunctivae and secretions clear            Ears- Hearing, canals-normal            Nose- +, no-Septal dev, mucus, polyps, erosion, perforation             Throat- Mallampati III , mucosa clear , drainage- none, tonsils- atrophic Neck- flexible , trachea midline, no stridor , thyroid nl, carotid no bruit Chest - symmetrical excursion , unlabored           Heart/CV- RRR , no murmur , no gallop  , no rub, nl s1 s2                           - JVD- none , edema- none, stasis changes- none, varices-  none           Lung- clear to P&A diminished in bases, wheeze- none, cough+ light , dullness-none, rub- none.+ Tachypnea, with limited activity in the room.           Chest wall-  Abd-  Br/ Gen/ Rectal- Not done, not indicated Extrem- cyanosis- none, clubbing, none, atrophy- none, strength- nl Neuro- grossly intact to observation

## 2014-10-10 ENCOUNTER — Ambulatory Visit (HOSPITAL_COMMUNITY)
Admission: RE | Admit: 2014-10-10 | Discharge: 2014-10-10 | Disposition: A | Discharge status: Home / Self Care | Payer: Medicare Other | Source: Ambulatory Visit | Attending: Internal Medicine | Admitting: Internal Medicine

## 2014-10-10 DIAGNOSIS — I420 Dilated cardiomyopathy: Secondary | ICD-10-CM | POA: Insufficient documentation

## 2014-10-10 DIAGNOSIS — I5022 Chronic systolic (congestive) heart failure: Secondary | ICD-10-CM

## 2014-10-10 LAB — BASIC METABOLIC PANEL
ANION GAP: 11 (ref 5–15)
BUN: 13 mg/dL (ref 6–23)
CHLORIDE: 106 meq/L (ref 96–112)
CO2: 21 mmol/L (ref 19–32)
Calcium: 9.3 mg/dL (ref 8.4–10.5)
Creatinine, Ser: 1.02 mg/dL (ref 0.50–1.10)
GFR, EST AFRICAN AMERICAN: 82 mL/min — AB (ref >90)
GFR, EST NON AFRICAN AMERICAN: 71 mL/min — AB (ref >90)
Glucose, Bld: 306 mg/dL — ABNORMAL HIGH (ref 70–99)
POTASSIUM: 3.9 mmol/L (ref 3.5–5.1)
SODIUM: 138 mmol/L (ref 135–145)

## 2014-10-10 NOTE — Assessment & Plan Note (Signed)
I had discussed her pulmonary status with her cardiologist. Question was whether she should have lung biopsy for diagnosis of the apparent interstitial lung disease once any congestive heart failure component was better controlled.

## 2014-10-10 NOTE — Assessment & Plan Note (Signed)
Chest x-ray is improved some. She is still hypoxic on low-flow oxygen and not close to being able to come off of oxygen yet. We discussed this. She would like a lighter portable system.

## 2014-10-17 ENCOUNTER — Encounter (HOSPITAL_COMMUNITY): Payer: Self-pay

## 2014-10-17 ENCOUNTER — Telehealth (HOSPITAL_COMMUNITY): Payer: Self-pay

## 2014-10-17 NOTE — Telephone Encounter (Signed)
Per Dr. Gala Romney, patient scheduled for right heart catheterization to assess PAH and LVEDP following pulmonary function test with severe restriction and decrease in DLCO.  Patient made aware of time scheduled for Friday 10/27/2014 at 10:30 am with arrival time at 8:30 am.  Address and phone number confirmed to send instruction letter for patient to review with restrictions to being NPO day of and medications told hold morning of.  Asked to call us back with any further questions.  Aware and agreeable.  Ave Filter

## 2014-10-18 ENCOUNTER — Telehealth (HOSPITAL_COMMUNITY): Payer: Self-pay | Admitting: Cardiology

## 2014-10-18 ENCOUNTER — Ambulatory Visit: Payer: Medicare Other | Admitting: Family Medicine

## 2014-10-18 ENCOUNTER — Telehealth: Payer: Self-pay | Admitting: Family Medicine

## 2014-10-18 ENCOUNTER — Other Ambulatory Visit: Payer: Self-pay | Admitting: Family Medicine

## 2014-10-18 NOTE — Telephone Encounter (Signed)
Received refill request for Lamictal, last discussion regarding resuming Lamictal and mood in 06/2014, patient tried to resume medication but then later stated that she felt "better" and her "mood improved" and felt like she "no longer needed to take the medicine". Self discontinued lamcital, currently stable without any concerns. Confirmed this with her and discontinued her rx. No refill at this time.  Also, discussed elevated CBGs and recently discontinued Metformin due to complications with NYHA III-IIIB symptoms with systolic CHF. She understands, previously advised by Cardiology. Future apt to discuss glucose and alternative management.  Saralyn Pilar, DO [facility], PGY-2

## 2014-10-18 NOTE — Telephone Encounter (Signed)
Pt scheduled for RHC on 10/27/2014 Cpt code- 68341 icd 10- i50.22 With pts current insurace- no pre cert req'd

## 2014-10-24 ENCOUNTER — Inpatient Hospital Stay (HOSPITAL_COMMUNITY)
Admission: EM | Admit: 2014-10-24 | Discharge: 2014-10-26 | DRG: 637 | Disposition: A | Discharge status: Home / Self Care | Payer: Medicare Other | Attending: Family Medicine | Admitting: Family Medicine

## 2014-10-24 ENCOUNTER — Other Ambulatory Visit: Payer: Self-pay

## 2014-10-24 ENCOUNTER — Encounter (HOSPITAL_COMMUNITY): Payer: Self-pay | Admitting: *Deleted

## 2014-10-24 ENCOUNTER — Emergency Department (HOSPITAL_COMMUNITY): Payer: Medicare Other

## 2014-10-24 DIAGNOSIS — Z950 Presence of cardiac pacemaker: Secondary | ICD-10-CM | POA: Diagnosis not present

## 2014-10-24 DIAGNOSIS — J849 Interstitial pulmonary disease, unspecified: Secondary | ICD-10-CM | POA: Diagnosis present

## 2014-10-24 DIAGNOSIS — E1169 Type 2 diabetes mellitus with other specified complication: Secondary | ICD-10-CM | POA: Insufficient documentation

## 2014-10-24 DIAGNOSIS — E860 Dehydration: Secondary | ICD-10-CM

## 2014-10-24 DIAGNOSIS — Q246 Congenital heart block: Secondary | ICD-10-CM | POA: Diagnosis not present

## 2014-10-24 DIAGNOSIS — I420 Dilated cardiomyopathy: Secondary | ICD-10-CM | POA: Diagnosis present

## 2014-10-24 DIAGNOSIS — Z6835 Body mass index (BMI) 35.0-35.9, adult: Secondary | ICD-10-CM | POA: Diagnosis not present

## 2014-10-24 DIAGNOSIS — R42 Dizziness and giddiness: Secondary | ICD-10-CM | POA: Diagnosis present

## 2014-10-24 DIAGNOSIS — I10 Essential (primary) hypertension: Secondary | ICD-10-CM | POA: Diagnosis present

## 2014-10-24 DIAGNOSIS — R0602 Shortness of breath: Secondary | ICD-10-CM | POA: Diagnosis not present

## 2014-10-24 DIAGNOSIS — Z794 Long term (current) use of insulin: Secondary | ICD-10-CM | POA: Diagnosis not present

## 2014-10-24 DIAGNOSIS — Z9981 Dependence on supplemental oxygen: Secondary | ICD-10-CM

## 2014-10-24 DIAGNOSIS — J45909 Unspecified asthma, uncomplicated: Secondary | ICD-10-CM | POA: Diagnosis present

## 2014-10-24 DIAGNOSIS — E1165 Type 2 diabetes mellitus with hyperglycemia: Principal | ICD-10-CM | POA: Diagnosis present

## 2014-10-24 DIAGNOSIS — Z87891 Personal history of nicotine dependence: Secondary | ICD-10-CM | POA: Diagnosis not present

## 2014-10-24 DIAGNOSIS — Z91048 Other nonmedicinal substance allergy status: Secondary | ICD-10-CM

## 2014-10-24 DIAGNOSIS — Z8249 Family history of ischemic heart disease and other diseases of the circulatory system: Secondary | ICD-10-CM | POA: Diagnosis not present

## 2014-10-24 DIAGNOSIS — J9611 Chronic respiratory failure with hypoxia: Secondary | ICD-10-CM | POA: Diagnosis present

## 2014-10-24 DIAGNOSIS — I509 Heart failure, unspecified: Secondary | ICD-10-CM | POA: Diagnosis not present

## 2014-10-24 DIAGNOSIS — E669 Obesity, unspecified: Secondary | ICD-10-CM

## 2014-10-24 DIAGNOSIS — Z888 Allergy status to other drugs, medicaments and biological substances status: Secondary | ICD-10-CM

## 2014-10-24 DIAGNOSIS — R739 Hyperglycemia, unspecified: Secondary | ICD-10-CM

## 2014-10-24 DIAGNOSIS — R918 Other nonspecific abnormal finding of lung field: Secondary | ICD-10-CM | POA: Diagnosis not present

## 2014-10-24 DIAGNOSIS — I951 Orthostatic hypotension: Secondary | ICD-10-CM

## 2014-10-24 DIAGNOSIS — Z7952 Long term (current) use of systemic steroids: Secondary | ICD-10-CM

## 2014-10-24 DIAGNOSIS — I5022 Chronic systolic (congestive) heart failure: Secondary | ICD-10-CM | POA: Diagnosis present

## 2014-10-24 HISTORY — DX: Type 2 diabetes mellitus without complications: E11.9

## 2014-10-24 HISTORY — DX: Presence of cardiac pacemaker: Z95.0

## 2014-10-24 LAB — BASIC METABOLIC PANEL
ANION GAP: 13 (ref 5–15)
ANION GAP: 10 (ref 5–15)
Anion gap: 10 (ref 5–15)
Anion gap: 14 (ref 5–15)
BUN: 12 mg/dL (ref 6–23)
BUN: 10 mg/dL (ref 6–23)
BUN: 10 mg/dL (ref 6–23)
BUN: 9 mg/dL (ref 6–23)
CALCIUM: 9.3 mg/dL (ref 8.4–10.5)
CALCIUM: 9.8 mg/dL (ref 8.4–10.5)
CHLORIDE: 99 meq/L (ref 96–112)
CHLORIDE: 108 meq/L (ref 96–112)
CO2: 25 mmol/L (ref 19–32)
CO2: 19 mmol/L (ref 19–32)
CO2: 19 mmol/L (ref 19–32)
CO2: 26 mmol/L (ref 19–32)
CREATININE: 0.97 mg/dL (ref 0.50–1.10)
Calcium: 9.6 mg/dL (ref 8.4–10.5)
Calcium: 9.5 mg/dL (ref 8.4–10.5)
Chloride: 104 mEq/L (ref 96–112)
Chloride: 106 mEq/L (ref 96–112)
Creatinine, Ser: 1.18 mg/dL — ABNORMAL HIGH (ref 0.50–1.10)
Creatinine, Ser: 1.07 mg/dL (ref 0.50–1.10)
Creatinine, Ser: 0.94 mg/dL (ref 0.50–1.10)
GFR calc Af Amer: 69 mL/min — ABNORMAL LOW (ref >90)
GFR calc Af Amer: 78 mL/min — ABNORMAL LOW (ref >90)
GFR calc Af Amer: 87 mL/min — ABNORMAL LOW (ref >90)
GFR calc Af Amer: >90 mL/min (ref >90)
GFR calc non Af Amer: 75 mL/min — ABNORMAL LOW (ref >90)
GFR calc non Af Amer: 78 mL/min — ABNORMAL LOW (ref >90)
GFR, EST NON AFRICAN AMERICAN: 59 mL/min — AB (ref >90)
GFR, EST NON AFRICAN AMERICAN: 67 mL/min — AB (ref >90)
GLUCOSE: 676 mg/dL — AB (ref 70–99)
GLUCOSE: 270 mg/dL — AB (ref 70–99)
GLUCOSE: 171 mg/dL — AB (ref 70–99)
Glucose, Bld: 504 mg/dL — ABNORMAL HIGH (ref 70–99)
POTASSIUM: 4.2 mmol/L (ref 3.5–5.1)
Potassium: 3.9 mmol/L (ref 3.5–5.1)
Potassium: 4.6 mmol/L (ref 3.5–5.1)
Potassium: 3.9 mmol/L (ref 3.5–5.1)
SODIUM: 134 mmol/L — AB (ref 135–145)
SODIUM: 137 mmol/L (ref 135–145)
SODIUM: 144 mmol/L (ref 135–145)
Sodium: 138 mmol/L (ref 135–145)

## 2014-10-24 LAB — CBC WITH DIFFERENTIAL/PLATELET
Basophils Absolute: 0 10*3/uL (ref 0.0–0.1)
Basophils Relative: 0 % (ref 0–1)
EOS PCT: 0 % (ref 0–5)
Eosinophils Absolute: 0 10*3/uL (ref 0.0–0.7)
HCT: 39.8 % (ref 36.0–46.0)
Hemoglobin: 13.1 g/dL (ref 12.0–15.0)
LYMPHS PCT: 27 % (ref 12–46)
Lymphs Abs: 1.6 10*3/uL (ref 0.7–4.0)
MCH: 28.1 pg (ref 26.0–34.0)
MCHC: 32.9 g/dL (ref 30.0–36.0)
MCV: 85.4 fL (ref 78.0–100.0)
Monocytes Absolute: 0.5 10*3/uL (ref 0.1–1.0)
Monocytes Relative: 8 % (ref 3–12)
Neutro Abs: 3.8 10*3/uL (ref 1.7–7.7)
Neutrophils Relative %: 65 % (ref 43–77)
Platelets: 232 10*3/uL (ref 150–400)
RBC: 4.66 MIL/uL (ref 3.87–5.11)
RDW: 13.7 % (ref 11.5–15.5)
WBC: 6 10*3/uL (ref 4.0–10.5)

## 2014-10-24 LAB — HEMOGLOBIN A1C
HEMOGLOBIN A1C: 9.3 % — AB (ref <5.7)
Mean Plasma Glucose: 220 mg/dL — ABNORMAL HIGH (ref <117)

## 2014-10-24 LAB — LIPID PANEL
CHOL/HDL RATIO: 6.5 ratio
Cholesterol: 200 mg/dL (ref 0–200)
HDL: 31 mg/dL — ABNORMAL LOW (ref >39)
LDL CALC: 136 mg/dL — AB (ref 0–99)
Triglycerides: 163 mg/dL — ABNORMAL HIGH (ref <150)
VLDL: 33 mg/dL (ref 0–40)

## 2014-10-24 LAB — CBG MONITORING, ED: GLUCOSE-CAPILLARY: 532 mg/dL — AB (ref 70–99)

## 2014-10-24 LAB — GLUCOSE, CAPILLARY
GLUCOSE-CAPILLARY: 450 mg/dL — AB (ref 70–99)
GLUCOSE-CAPILLARY: 230 mg/dL — AB (ref 70–99)
GLUCOSE-CAPILLARY: 83 mg/dL (ref 70–99)
Glucose-Capillary: 449 mg/dL — ABNORMAL HIGH (ref 70–99)
Glucose-Capillary: 546 mg/dL — ABNORMAL HIGH (ref 70–99)
Glucose-Capillary: 327 mg/dL — ABNORMAL HIGH (ref 70–99)
Glucose-Capillary: 295 mg/dL — ABNORMAL HIGH (ref 70–99)
Glucose-Capillary: 198 mg/dL — ABNORMAL HIGH (ref 70–99)
Glucose-Capillary: 154 mg/dL — ABNORMAL HIGH (ref 70–99)
Glucose-Capillary: 147 mg/dL — ABNORMAL HIGH (ref 70–99)
Glucose-Capillary: 258 mg/dL — ABNORMAL HIGH (ref 70–99)

## 2014-10-24 LAB — CBC
HEMATOCRIT: 39.8 % (ref 36.0–46.0)
HEMOGLOBIN: 13.2 g/dL (ref 12.0–15.0)
MCH: 28.8 pg (ref 26.0–34.0)
MCHC: 33.2 g/dL (ref 30.0–36.0)
MCV: 86.9 fL (ref 78.0–100.0)
Platelets: 227 10*3/uL (ref 150–400)
RBC: 4.58 MIL/uL (ref 3.87–5.11)
RDW: 13.7 % (ref 11.5–15.5)
WBC: 6.8 10*3/uL (ref 4.0–10.5)

## 2014-10-24 LAB — URINE MICROSCOPIC-ADD ON

## 2014-10-24 LAB — URINALYSIS, ROUTINE W REFLEX MICROSCOPIC
Bilirubin Urine: NEGATIVE
Glucose, UA: >1000 mg/dL — AB
Ketones, ur: NEGATIVE mg/dL
Leukocytes, UA: NEGATIVE
Nitrite: NEGATIVE
PROTEIN: NEGATIVE mg/dL
Specific Gravity, Urine: <1.005 — ABNORMAL LOW (ref 1.005–1.030)
Urobilinogen, UA: 0.2 mg/dL (ref 0.0–1.0)
pH: 6 (ref 5.0–8.0)

## 2014-10-24 LAB — I-STAT VENOUS BLOOD GAS, ED
Acid-base deficit: 2 mmol/L (ref 0.0–2.0)
BICARBONATE: 25.3 meq/L — AB (ref 20.0–24.0)
O2 Saturation: 38 %
PCO2 VEN: 50.6 mmHg — AB (ref 45.0–50.0)
PH VEN: 7.308 — AB (ref 7.250–7.300)
PO2 VEN: 25 mmHg — AB (ref 30.0–45.0)
TCO2: 27 mmol/L (ref 0–100)

## 2014-10-24 LAB — BRAIN NATRIURETIC PEPTIDE: B Natriuretic Peptide: 16.4 pg/mL (ref 0.0–100.0)

## 2014-10-24 LAB — MAGNESIUM: MAGNESIUM: 2.1 mg/dL (ref 1.5–2.5)

## 2014-10-24 LAB — PHOSPHORUS: Phosphorus: 2.8 mg/dL (ref 2.3–4.6)

## 2014-10-24 LAB — TSH: TSH: 1.428 u[IU]/mL (ref 0.350–4.500)

## 2014-10-24 MED ORDER — FLUTICASONE PROPIONATE 50 MCG/ACT NA SUSP
2.0000 | Freq: Every day | NASAL | Status: DC | PRN
  Filled 2014-10-24: qty 16

## 2014-10-24 MED ORDER — INSULIN GLARGINE 100 UNIT/ML ~~LOC~~ SOLN
5.0000 [IU] | Freq: Once | SUBCUTANEOUS | Status: AC
  Administered 2014-10-24: 5 [IU] via SUBCUTANEOUS
  Filled 2014-10-24: qty 0.05

## 2014-10-24 MED ORDER — ALBUTEROL SULFATE (2.5 MG/3ML) 0.083% IN NEBU: 2.5000 mg | INHALATION_SOLUTION | RESPIRATORY_TRACT | Status: DC | PRN

## 2014-10-24 MED ORDER — SPIRONOLACTONE 12.5 MG HALF TABLET
12.5000 mg | ORAL_TABLET | Freq: Every day | ORAL | Status: DC
  Administered 2014-10-24 – 2014-10-26 (×3): 12.5 mg via ORAL
  Filled 2014-10-24 (×3): qty 1

## 2014-10-24 MED ORDER — POLYETHYLENE GLYCOL 3350 17 G PO PACK
17.0000 g | PACK | Freq: Every day | ORAL | Status: DC | PRN
  Filled 2014-10-24: qty 1

## 2014-10-24 MED ORDER — BISOPROLOL FUMARATE 5 MG PO TABS
2.5000 mg | ORAL_TABLET | Freq: Every day | ORAL | Status: DC
  Administered 2014-10-24 – 2014-10-26 (×3): 2.5 mg via ORAL
  Filled 2014-10-24 (×5): qty 0.5

## 2014-10-24 MED ORDER — HEPARIN SODIUM (PORCINE) 5000 UNIT/ML IJ SOLN
5000.0000 [IU] | Freq: Three times a day (TID) | INTRAMUSCULAR | Status: DC
  Administered 2014-10-24 – 2014-10-26 (×6): 5000 [IU] via SUBCUTANEOUS
  Filled 2014-10-24 (×8): qty 1

## 2014-10-24 MED ORDER — POTASSIUM CHLORIDE CRYS ER 20 MEQ PO TBCR
20.0000 meq | EXTENDED_RELEASE_TABLET | Freq: Every day | ORAL | Status: DC
  Administered 2014-10-24 – 2014-10-26 (×3): 20 meq via ORAL
  Filled 2014-10-24 (×3): qty 1

## 2014-10-24 MED ORDER — PREDNISONE 10 MG PO TABS
10.0000 mg | ORAL_TABLET | Freq: Every day | ORAL | Status: DC
  Administered 2014-10-25 – 2014-10-26 (×2): 10 mg via ORAL
  Filled 2014-10-24 (×3): qty 1

## 2014-10-24 MED ORDER — SODIUM CHLORIDE 0.9 % IV BOLUS (SEPSIS)
500.0000 mL | Freq: Once | INTRAVENOUS | Status: AC
  Administered 2014-10-24: 500 mL via INTRAVENOUS

## 2014-10-24 MED ORDER — SODIUM CHLORIDE 0.9 % IV SOLN
INTRAVENOUS | Status: AC
  Administered 2014-10-24: 4.7 [IU]/h via INTRAVENOUS
  Filled 2014-10-24: qty 2.5

## 2014-10-24 MED ORDER — ACETAMINOPHEN 325 MG PO TABS: 650.0000 mg | ORAL_TABLET | Freq: Four times a day (QID) | ORAL | Status: DC | PRN

## 2014-10-24 MED ORDER — AZELASTINE-FLUTICASONE 137-50 MCG/ACT NA SUSP
2.0000 | Freq: Every day | NASAL | Status: DC
  Filled 2014-10-24: qty 1

## 2014-10-24 MED ORDER — LORATADINE 10 MG PO TABS
10.0000 mg | ORAL_TABLET | Freq: Every day | ORAL | Status: DC
  Administered 2014-10-26: 10 mg via ORAL
  Filled 2014-10-24 (×3): qty 1

## 2014-10-24 MED ORDER — INSULIN ASPART 100 UNIT/ML ~~LOC~~ SOLN
0.0000 [IU] | SUBCUTANEOUS | Status: DC
Start: 2014-10-24 — End: 2014-10-25
  Administered 2014-10-24 – 2014-10-25 (×2): 5 [IU] via SUBCUTANEOUS
  Administered 2014-10-25: 7 [IU] via SUBCUTANEOUS
  Administered 2014-10-25: 3 [IU] via SUBCUTANEOUS

## 2014-10-24 MED ORDER — SODIUM CHLORIDE 0.9 % IV SOLN: Freq: Once | INTRAVENOUS | Status: DC

## 2014-10-24 MED ORDER — BUDESONIDE-FORMOTEROL FUMARATE 80-4.5 MCG/ACT IN AERO
2.0000 | INHALATION_SPRAY | Freq: Two times a day (BID) | RESPIRATORY_TRACT | Status: DC | PRN
  Filled 2014-10-24: qty 6.9

## 2014-10-24 MED ORDER — INSULIN ASPART 100 UNIT/ML ~~LOC~~ SOLN: 10.0000 [IU] | Freq: Once | SUBCUTANEOUS | Status: DC

## 2014-10-24 MED ORDER — DEXTROSE-NACL 5-0.45 % IV SOLN
INTRAVENOUS | Status: DC
  Administered 2014-10-24: 17:00:00 via INTRAVENOUS

## 2014-10-24 MED ORDER — ALBUTEROL SULFATE HFA 108 (90 BASE) MCG/ACT IN AERS: 2.0000 | INHALATION_SPRAY | RESPIRATORY_TRACT | Status: DC | PRN

## 2014-10-24 MED ORDER — FUROSEMIDE 20 MG PO TABS
20.0000 mg | ORAL_TABLET | Freq: Every day | ORAL | Status: DC
  Administered 2014-10-24 – 2014-10-26 (×3): 20 mg via ORAL
  Filled 2014-10-24 (×3): qty 1

## 2014-10-24 MED ORDER — ACETAMINOPHEN 650 MG RE SUPP: 650.0000 mg | Freq: Four times a day (QID) | RECTAL | Status: DC | PRN

## 2014-10-24 MED ORDER — LOSARTAN POTASSIUM 25 MG PO TABS
25.0000 mg | ORAL_TABLET | Freq: Two times a day (BID) | ORAL | Status: DC
  Administered 2014-10-24 – 2014-10-26 (×5): 25 mg via ORAL
  Filled 2014-10-24 (×6): qty 1

## 2014-10-24 NOTE — Progress Notes (Signed)
Inpatient Diabetes Program Recommendations  AACE/ADA: New Consensus Statement on Inpatient Glycemic Control (2013)  Target Ranges:  Prepandial:   less than 140 mg/dL      Peak postprandial:   less than 180 mg/dL (1-2 hours)      Critically ill patients:  140 - 180 mg/dL   Inpatient Diabetes Program Recommendations Insulin - Basal: give Lantus 15 units 2 hours before discontinuing insulin gtt Correction (SSI): start Novolog moderate correction TID + HS per Glycemic Control order-set HgbA1C: pending  Will follow. Thank you  Piedad Climes BSN, RN,CDE Inpatient Diabetes Coordinator [phone] (team pager)

## 2014-10-24 NOTE — ED Provider Notes (Signed)
CSN: [id number]     Arrival date & time 10/24/14  0445 History   First MD Initiated Contact with Patient 10/24/14 0601     Chief Complaint  Patient presents with  . Dizziness     (Consider location/radiation/quality/duration/timing/severity/associated sxs/prior Treatment) HPI  Makayla Vasquez is a 34 y.o. female with PMH of congenital complete AV block and cardiac pacemaker since 6 followed by cardiology and pulmonology presenting with dizziness described as lightheadedness and vertigo started last night. Patient states it is worse when she stands quickly or moves her head. Patient has never had anything like this before. She states the dizziness resolved with rest or visual fixation. Patient states over the past 3 days she has been taking fluid pills and has had frequency but has not been drinking as much. Pt endorses some nausea described as "stomach weakness," but no vomiting. Patient denies any headaches, visual changes, slurred speech, weakness anywhere. No numbness or tingling. No chest pain, shortness of breath, vomiting, abdominal pain, back pain. Patient on home oxygen 4 L. Patient also with planned cardiac catheterization in 3 days.   Past Medical History  Diagnosis Date  . Cardiac pacemaker     a. Since age 73 in 61. b. Upgrade to BiV in 2013.  . Congenital complete AV block   . Obesity   . GERD (gastroesophageal reflux disease)   . Asymptomatic LV dysfunction     a. Echo in Dec 2011 with EF 35 to 40%. Felt to be due to paced rhythm. b. EF 25-30% in 07/2014.  . Seizures     as a child- from high fever  . Anxiety   . Bipolar affective disorder   . Depression     bipolar  . Carpal tunnel syndrome of right wrist   . Asthma     seasonal allergies   . Arthritis     rheumatoid arthritis- mild, no rheumatology care   . Hypertension   . Pneumonitis     a. a/w hypoxia - inflammatory - large workup 07/2014.  [name] Sinus tachycardia   . Diabetes mellitus without complication     Past Surgical History  Procedure Laterality Date  . Throat surgery  1994    s/p laser treatment  . Cesarean section    . Insert / replace / remove pacemaker      2001  . Cholecystectomy    . Iud removal  11/03/2011    Procedure: INTRAUTERINE DEVICE (IUD) REMOVAL;  Surgeon: Myra C. Marice Potter, MD;  Location: [facility] ORS;  Service: Gynecology;  Laterality: N/A;  . Cyst excision  12/10/2012    THYROID  . Thyroglossal duct cyst N/A 12/10/2012    Procedure: REVISION OF THYROGLOSSAL DUCT CYST EXCISION;  Surgeon: Serena Colonel, MD;  Location: [facility] OR;  Service: ENT;  Laterality: N/A;  Revision of Thyroglossal Duct Cyst Excision  . Nasal fracture surgery      /w plate   . Carpal tunnel with cubital tunnel Right 07/26/2013    Procedure: RIGHT LIMITED OPEN CARPAL TUNNEL RELEASE ,  RIGHT CUBITAL TUNNEL RELEASE, INSITU VERSES ULNAR NERVE DECOMPRESSION AND ANTERIOR TRANSPOSITION;  Surgeon: Dominica Severin, MD;  Location: [facility] OR;  Service: Orthopedics;  Laterality: Right;  . Video bronchoscopy Bilateral 06/19/2014    Procedure: VIDEO BRONCHOSCOPY WITHOUT FLUORO;  Surgeon: Kalman Shan, MD;  Location: [facility] ENDOSCOPY;  Service: Cardiopulmonary;  Laterality: Bilateral;  . Bi-ventricular pacemaker upgrade N/A 03/08/2012    Procedure: BI-VENTRICULAR PACEMAKER UPGRADE;  Surgeon: Marinus Maw, MD;  Location:  [facility] CATH LAB;  Service: Cardiovascular;  Laterality: N/A;  . Atrial tach ablation N/A 08/14/2014    Procedure: ATRIAL TACH ABLATION;  Surgeon: Marinus Maw, MD;  Location: [facility] CATH LAB;  Service: Cardiovascular;  Laterality: N/A;   Family History  Problem Relation Age of Onset  . Heart disease Mother     CHF (no details)  . Hypertension Mother   . Heart disease Father     Murmur  . Heart disease Sister 8     No details.  History of a pacemaker  . Lupus Mother    History  Substance Use Topics  . Smoking status: Former Smoker -- 0.25 packs/day for .5 years    Types: Cigarettes    Quit date: 07/25/1996  .  Smokeless tobacco: Never Used  . Alcohol Use: No   OB History    Gravida Para Term Preterm AB TAB SAB Ectopic Multiple Living   1 1 1       1      Review of Systems 10 Systems reviewed and are negative for acute change except as noted in the HPI.    Allergies  Sertraline hcl and Tape  Home Medications   Prior to Admission medications   Medication Sig Start Date End Date Taking? Authorizing Provider  albuterol (PROVENTIL HFA;VENTOLIN HFA) 108 (90 BASE) MCG/ACT inhaler Inhale 2 puffs into the lungs every 4 (four) hours as needed for wheezing or shortness of breath. 06/16/14  Yes 06/18/14, DO  bisoprolol (ZEBETA) 5 MG tablet Take 0.5 tablets (2.5 mg total) by mouth daily. 09/18/14  Yes 09/20/14, MD  budesonide-formoterol (SYMBICORT) 80-4.5 MCG/ACT inhaler Inhale 2 puffs into the lungs 2 (two) times daily as needed (for shortness of breath).  04/06/14  Yes 04/08/14, MD  cetirizine (ZYRTEC) 10 MG tablet Take 1 tablet (10 mg total) by mouth daily. 05/22/14  Yes 07/22/14, DO  etonogestrel (NEXPLANON) 68 MG IMPL implant 1 each by Subdermal route once.   Yes Historical Provider, MD  fluticasone (FLONASE) 50 MCG/ACT nasal spray Place 2 sprays into both nostrils daily as needed for allergies or rhinitis.   Yes Historical Provider, MD  furosemide (LASIX) 20 MG tablet Take 1 tablet (20 mg total) by mouth daily. For shortness of breath and/or leg swelling. 09/22/14  Yes 14/4/15, MD  losartan (COZAAR) 25 MG tablet Take 1 tablet (25 mg total) by mouth 2 (two) times daily. 09/28/14  Yes 14/10/15, MD  potassium chloride SA (K-DUR,KLOR-CON) 20 MEQ tablet Take 20 mEq by mouth daily.   Yes Historical Provider, MD  predniSONE (DELTASONE) 10 MG tablet Take 10 mg by mouth daily with breakfast.    Yes Historical Provider, MD  spironolactone (ALDACTONE) 25 MG tablet Take 0.5 tablets (12.5 mg total) by mouth daily. 09/28/14  Yes 14/10/15, MD   Azelastine-Fluticasone [facility]) 137-50 MCG/ACT SUSP Place 2 sprays into both nostrils at bedtime.    Historical Provider, MD   BP 116/79 mmHg  Pulse 87  Temp(Src) 98.6 F (37 C) (Oral)  Resp 20  Ht 5\' 6"  (1.676 m)  Wt 214 lb 15.2 oz (97.5 kg)  BMI 34.71 kg/m2  SpO2 98% Physical Exam  Constitutional: She appears well-developed and well-nourished. No distress.  HENT:  Head: Normocephalic and atraumatic.  Mouth/Throat: Oropharynx is clear and moist.  Eyes: Conjunctivae and EOM are normal. Pupils are equal, round, and reactive to light. Right eye exhibits no discharge. Left eye exhibits no discharge.  Neck: Normal range of motion. Neck supple.  No nuchal rigidity  Cardiovascular: Normal rate and regular rhythm.   Pulmonary/Chest: Effort normal and breath sounds normal. No respiratory distress. She has no wheezes.  Abdominal: Soft. Bowel sounds are normal. She exhibits no distension. There is no tenderness.  Neurological: She is alert. No cranial nerve deficit. Coordination normal.  Speech is clear and goal oriented. Peripheral visual fields intact. Strength 5/5 in upper and lower extremities. Sensation intact. Intact rapid alternating movements, finger to nose, and heel to shin. Negative Romberg. No pronator drift. Gait not assessed.    Skin: Skin is warm and dry. She is not diaphoretic.  Nursing note and vitals reviewed.   ED Course  Procedures (including critical care time) Labs Review Labs Reviewed  BASIC METABOLIC PANEL - Abnormal; Notable for the following:    Sodium 134 (*)    Glucose, Bld 676 (*)    Creatinine, Ser 1.18 (*)    GFR calc non Af Amer 59 (*)    GFR calc Af Amer 69 (*)    All other components within normal limits  URINALYSIS, ROUTINE W REFLEX MICROSCOPIC - Abnormal; Notable for the following:    Specific Gravity, Urine <1.005 (*)    Glucose, UA >1000 (*)    Hgb urine dipstick LARGE (*)    All other components within normal limits  URINE MICROSCOPIC-ADD  ON - Abnormal; Notable for the following:    Squamous Epithelial / LPF FEW (*)    All other components within normal limits  I-STAT VENOUS BLOOD GAS, ED - Abnormal; Notable for the following:    pH, Ven 7.308 (*)    pCO2, Ven 50.6 (*)    pO2, Ven 25.0 (*)    Bicarbonate 25.3 (*)    All other components within normal limits  CBG MONITORING, ED - Abnormal; Notable for the following:    Glucose-Capillary 532 (*)    All other components within normal limits  CBC WITH DIFFERENTIAL    Imaging Review Dg Chest Port 1 View  10/24/2014   CLINICAL DATA:  34 year old female with shortness of breath and dizziness beginning at 0400 hrs. Hyperglycemia. Initial encounter.  EXAM: PORTABLE CHEST - 1 VIEW  COMPARISON:  09/18/2014 and earlier.  FINDINGS: Portable AP semi upright view at 0636 hrs. Continued low lung volumes. Stable cardiac size and mediastinal contours. Left chest cardiac AICD/pacemaker re- identified. Continued basilar predominant increased interstitial opacity, not significantly changed from prior. No pneumothorax. No pleural effusion or consolidation identified. Visualized tracheal air column is within normal limits.  IMPRESSION: Continued basilar predominant patchy and interstitial opacity as seen on prior studies since August 2015 (see also chest CTA report 06/16/2014). No new cardiopulmonary abnormality identified.   Electronically Signed   By: Augusto Gamble M.D.   On: 10/24/2014 07:59     EKG Interpretation   Date/Time:  Tuesday October 24 2014 05:56:38 EST Ventricular Rate:  81 PR Interval:  137 QRS Duration: 110 QT Interval:  420 QTC Calculation: 487 R Axis:   169 Text Interpretation:  Electronic ventricular pacemaker pacer spikes not  apparent No significant change since last tracing Confirmed by OTTER  MD,  OLGA ([id number]) on 10/24/2014 6:01:54 AM      MDM   Final diagnoses:  CHF (congestive heart failure)  Hyperglycemia  Diabetes mellitus type 2 in obese  Orthostasis   Dehydration   Patient with history of complete AV block with pacemaker since 15 as well as congestive heart failure followed by  cardiology who developed dizziness after diureses with fluid pills. Patient with history of prediabetes and was taking metformin but was discontinued by her cardiologist in November due to congestive heart failure. VSS with tachypnea. Patient without any pain on exam no evidence of volume overload. Neurological exam without deficits. Patient found to have CBG of over 600. Also with evidence of dehydration on exam as well as UA. Lightheadedness likely due to CBG with element of dehydration. However patient with history of congestive heart failure so fluids use cautiously. Given IV bolus of 500. Also given glucose stabilize her. Consult to family medicine for admission. Admission to telemetry bed to Dr. Deirdre Priest' service. Consult to cardiology who agrees to evaluate patient.   Discussed all results and patient verbalizes understanding and agrees with plan.  This is a shared patient. This patient was discussed with the physician, Dr. Norlene Campbell who saw and evaluated the patient and agrees with the plan.   Louann Sjogren, PA-C 10/24/14 9628  Olivia Mackie, MD 10/25/14 [phone]

## 2014-10-24 NOTE — Evaluation (Signed)
Physical Therapy Evaluation Patient Details Name: Makayla Vasquez MRN: [id number] DOB: 1981/04/27 Today's Date: 10/24/2014   History of Present Illness  Pt admit with hyperglycemia, dizziness and questionable dehydration.    Clinical Impression  Pt admitted with above diagnosis. Pt currently with functional limitations due to the deficits listed below (see PT Problem List). Pt with positive BPPV left side treated today with canalith repositioning maneuver.  Hopeful that the treatment will be effective.  Will check back tomorrow to treat further as needed.  Pt can get f/u at Outpt to continue vestibular rehab treatment as well.  Pt also with fluctuating BP with treatment (see stats below).  Pt may need a RW for stability initially upon d/c home as she is unsteady at this time.  Will continue PT.   Pt will benefit from skilled PT to increase their independence and safety with mobility to allow discharge to the venue listed below.      Follow Up Recommendations Outpatient PT;Supervision - Intermittent (for vestibular rehab)    Equipment Recommendations  Rolling walker with 5" wheels (may need RW for stability.)    Recommendations for Other Services       Precautions / Restrictions Precautions Precautions: Fall Restrictions Weight Bearing Restrictions: No      Mobility  Bed Mobility Overal bed mobility: Independent             General bed mobility comments: Tested with supine head roll with pt negative per testing.  Pt then positive test for left BPPV.  Treated pt with canalith repositioning maneuver.  Pt originally with 8/10 dizzy and after treatment 4/10 dizzy.    Transfers Overall transfer level: Needs assistance Equipment used: None Transfers: Sit to/from Stand Sit to Stand: Min guard         General transfer comment: slightly unsteady in standing occasionally needing 1 HHA.  Checked orthostatics as well (see below).  Pts BP did fluctuate with position changes.  Dizziness  also changed with position changes too.    Ambulation/Gait                Stairs            Wheelchair Mobility    Modified Rankin (Stroke Patients Only)       Balance Overall balance assessment: Needs assistance Sitting-balance support: No upper extremity supported;Feet supported Sitting balance-Leahy Scale: Good     Standing balance support: During functional activity;No upper extremity supported;Single extremity supported Standing balance-Leahy Scale: Fair Standing balance comment: Had periods of standing without UE support.                               Pertinent Vitals/Pain Pain Assessment: No/denies pain  Orthostatic BPs  Supine 93/56, 80 bpm  Sitting 112/74, 95 bpm  Standing 100/89, 110 bpm  Standing after 3 min 110/85, 102 bpm  Pt was dizzy with each position change.     Home Living Family/patient expects to be discharged to:: Private residence Living Arrangements: Children;Non-relatives/Friends Available Help at Discharge: Friend(s);Family;Available PRN/intermittently (51 year old son will be with pt; family and boyfriend works) Type of Home: Apartment Home Access: Level entry     Home Layout: One level (states they will be in one level apt when she is d/c'd) Home Equipment: None      Prior Function Level of Independence: Independent               Hand Dominance  Extremity/Trunk Assessment   Upper Extremity Assessment: Defer to OT evaluation           Lower Extremity Assessment: Generalized weakness      Cervical / Trunk Assessment: Normal  Communication   Communication: No difficulties  Cognition Arousal/Alertness: Awake/alert Behavior During Therapy: WFL for tasks assessed/performed Overall Cognitive Status: Within Functional Limits for tasks assessed                      General Comments      Exercises        Assessment/Plan    PT Assessment Patient needs continued PT services   PT Diagnosis Generalized weakness;Other (comment) (dizziness)   PT Problem List Decreased activity tolerance;Decreased balance;Decreased mobility;Decreased knowledge of use of DME;Decreased safety awareness;Decreased knowledge of precautions (dizziness)  PT Treatment Interventions DME instruction;Gait training;Functional mobility training;Therapeutic exercise;Therapeutic activities;Balance training;Patient/family education   PT Goals (Current goals can be found in the Care Plan section) Acute Rehab PT Goals Patient Stated Goal: to go home PT Goal Formulation: With patient Time For Goal Achievement: 10/31/14 Potential to Achieve Goals: Good    Frequency Min 3X/week   Barriers to discharge Decreased caregiver support      Co-evaluation               End of Session Equipment Utilized During Treatment: Gait belt;Oxygen Activity Tolerance: Patient limited by fatigue;Other (comment) (limited by dizziness) Patient left: in chair;with call bell/phone within reach Nurse Communication: Mobility status         Time: 1884-1660 PT Time Calculation (min) (ACUTE ONLY): 31 min   Charges:   PT Evaluation $Initial PT Evaluation Tier I: 1 Procedure PT Treatments $Therapeutic Activity: 8-22 mins $Canalith Rep Proc: 8-22 mins   PT G CodesTawni Millers F 14-Nov-2014, 11:49 AM [facility] Acute [facility] [phone] (pager)

## 2014-10-24 NOTE — ED Notes (Signed)
Critical glucose of 676 called by Shawn in the lab.  Physician notified.

## 2014-10-24 NOTE — ED Notes (Signed)
Patient presents to ed c/o dizziness, states she was fine when she went to bed last pm, states she got up quickly to go to the bathroom and became dizzy, denies pain states she just feels dizzy. States she thinks she is dehydrated  States she takes fluid pills everyday and hasn't been drinking as much. Alert oriented , denies any weakness or numbness. No stroke sx.

## 2014-10-24 NOTE — Progress Notes (Signed)
[facility] PCP Visit - Progress Note  I have seen the patient and discussed current hospitalization. Makayla Vasquez appears to be in good spirits today. She reports that "dizziness" brought her into ED, and was found to have high blood sugar at that time. She was scheduled for RHC on 10/27/14, as follow-up from recent Cardiology / Pulmonology evaluation with PFTs, given her chronic ILD. She reports that she is currently comfortable without significant complaints. I advised her that the CHF Cardiology team was consulted, and she should anticipate seeing them soon, and to discuss plans for upcoming RHC.  I agree with the current hospital plan outlined by the primary team of [facility] Teaching [facility] and want to thank them for their continued excellent care and efforts in caring for my patient. I will anticipate to follow-up with the patient in the clinic after discharge from hospital.  Saralyn Pilar, DO [facility], PGY-2

## 2014-10-24 NOTE — Progress Notes (Signed)
UR completed Chianne Byrns K. Samyiah Halvorsen, RN, BSN, MSHL, CCM  10/24/2014 6:11 PM

## 2014-10-24 NOTE — H&P (Signed)
[facility] Admission History and Physical Service Pager: [phone]  Patient name: Makayla Vasquez Medical record number: [id number] Date of birth: 1981/04/07 Age: 34 y.o. Gender: female  Primary Care Provider: Nobie Putnam, DO Consultants: CHF team/Cardiology Code Status: Full code  Chief Complaint: dizziness  Assessment and Plan: TERRINA DOCTER is a 34 y.o. female presenting with dizziness. PMH is significant for complete AV block and cardiac pacemaker (since age 46 ), chronic systolic heart failure, chronic resp failure, sinus tachycardia, Interstitial lung disease, chronic steroid use, hypertension and prediabetes . She is followed by cardiology and pulmonology. She is scheduled for a cardiac cath in 3 days. She uses 4L O2 at home.  Unknown etiology of dizziness. Patient could be orthostatic, mildly dry on exam today. In addition it could be caused from her hyperglycemia, will admit to telemetry unit under Dr. Erin Hearing.  Hyperglycemia: patient with known history of hyperglycemia, prediabetes, had been on metformin but needed to be discontinued due to cardiac reasoning. Patient on chronic steroid usage 10 mg daily. On admission patient's glucose was 676. - patient admitted on insulin pump, per pump protocol.  - Patient is not on a long-acting insulin or oral agent, will need to place on sliding scale once her blood sugars have normalized. Calculation of daily insulin need versus oral diabetic medication (not metformin) for medication management. - Patient will need glycemic control  Orders once off glucose stabilizer - BMET q 2 x3, once glucose stabilized can decrease frequency.  - Patient currently on clears, can advance diet as tolerated after sugars are controlled. She has had decreased appetite over the last few days, normal daily fluid intake should be encouraged. - A1c, TSH pending - Diabetes coordinator consult ordered  AV  block/pacemaker/hypertension/CHF: patient with pacemaker ([employer]. [employer] CRT-P device), since age 60 for congenital AV block, chronic systolic heart failure.  NYHA class II sx. She appears to be a poor historian and is uncertain if her pacemaker has been interrogated recently, she has seen Dr. Lovena Le in November 2015. She now followed in the  CHF clinic.  Last echocardiogram August 2015: Normal LV size with mild LVH. Ejection fraction 25-30% with diffuse hypokinesis. They had planned to perform a cardiac cath on 1/8. Weight on admission 214, weight in December 224. - Continue home meds: Zebeta 2.5 mg daily, lasix 20 mg QD, Cozaar 25 mg BID, Aldactone 12.5 mg QD.   - Monitor daily weights, strict I/O - Lipids, BNP pending - CHF team consulted, we appreciate their recommendations.   Interstitial lung disease: Dyspnea class IIIB. On 3-4L of Oxygen at home.  - Continue prednisone 10 mg daily.  - Continue inhalers: albuterol, Symbicort, Dymista, Flonase, claritin  FEN/GI: Clears, may be advanced heart healthy/carb modified after insulin drip protocol is met. Prophylaxis: Heparin subcutaneous  Disposition: admit to telemetry, cardiology consulted, discharge pending stabilization of glucose and cardiac/PT/OT recommendations.   History of Present Illness: Makayla Vasquez is a 34 y.o. female presenting with dizziness, which started last night . PMH is significant for complete AV block, cardiac pacemaker (since age 23),  chronic systolic heart failure, chronic resp failure, sinus tachycardia, Interstitial lung disease, hypertension and prediabetes. She is followed by cardiology and pulmonology. She is scheduled for a cardiac cath in 3 days. She uses 4L O2 at home. Pt states she has been taking her medications as directed and does not complain of chest pain, dyspnea, orthopnea or palpitations. She has never felt dizziness like this before and  she describes it as the room spinning. She reports over the last 2 days her  appetite has been decreased, and she has not been drinking as many fluids as usual, she has continued to take her diuretics as prescribed. She states she is not on daily fluid restriction for her cardiac disease. She reports she was a prediabetic, and on metformin in the past, however due to her cardiac severity, metformin was discontinued. Patient is on daily prednisone 10 mg for her interstitial lung disease. Within the last 3 weeks her prednisone has been lowered from 50 mg to 10 mg daily dose.   Review Of Systems: Per HPI with the following additions: per HPI Otherwise 12 point review of systems was performed and was unremarkable.  Patient Active Problem List   Diagnosis Date Noted  . Hyperglycemia 10/24/2014  . Sinus tachycardia   . Chronic respiratory failure with hypoxia 08/07/2014  . Atrial tachycardia 07/26/2014  . Insertion of implantable subdermal contraceptive 07/12/2014  . Acute respiratory failure with hypoxia 06/19/2014  . ILD (interstitial lung disease) 06/19/2014  . Pulmonary infiltrates 06/17/2014  . Dyspnea 06/16/2014  . Hypoxemia 06/16/2014  . Congestive dilated cardiomyopathy 05/31/2014  . Nonallergic rhinitis 05/22/2014  . Vaginal itching 05/22/2014  . Pneumonia 05/09/2014  . Prediabetes 05/09/2014  . Shortness of breath 05/08/2014  . Sterilization consult 04/20/2014  . Breast tenderness 01/18/2014  . Carpal tunnel syndrome 09/10/2012  . Constipation 03/03/2012  . Chronic systolic heart failure 96/75/9163  . Chest pain, unspecified 12/29/2011  . Post-nasal drip 10/24/2011  . Contraception 01/29/2011  . Heart palpitations   . PPM-Medtronic   . Congenital complete AV block   . Bipolar affective disorder   . Obesity   . GERD (gastroesophageal reflux disease)   . Hypertension 01/05/2011  . THYROGLOSSAL DUCT CYST 07/18/2010  . Impaired glucose tolerance 05/07/2010  . UNSPECIFIED CARDIAC DYSRHYTHMIA 05/07/2010  . RH FACTOR, NEGATIVE 04/08/2010  . Mild  intermittent asthma without complication 84/66/5993  . 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 HEART BLOCK 12/17/2006  . IRRITABLE BOWEL SYNDROME 12/17/2006   Past Medical History: Past Medical History  Diagnosis Date  . Cardiac pacemaker     a. Since age 47 in 35. b. Upgrade to BiV in 2013.  . Congenital complete AV block   . Obesity   . GERD (gastroesophageal reflux disease)   . Asymptomatic LV dysfunction     a. Echo in Dec 2011 with EF 35 to 40%. Felt to be due to paced rhythm. b. EF 25-30% in 07/2014.  . Seizures     as a child- from high fever  . Anxiety   . Bipolar affective disorder   . Depression     bipolar  . Carpal tunnel syndrome of right wrist   . Asthma     seasonal allergies   . Arthritis     rheumatoid arthritis- mild, no rheumatology care   . Hypertension   . Pneumonitis     a. a/w hypoxia - inflammatory - large workup 07/2014.  [name] Sinus tachycardia   . Diabetes mellitus without complication    Past Surgical History: Past Surgical History  Procedure Laterality Date  . Throat surgery  1994    s/p laser treatment  . Cesarean section    . Insert / replace / remove pacemaker      2001  . Cholecystectomy    . Iud removal  11/03/2011    Procedure: INTRAUTERINE DEVICE (IUD) REMOVAL;  Surgeon: [name], MD;  Location: [facility];  Service: Gynecology;  Laterality: N/A;  . Cyst excision  12/10/2012    THYROID  . Thyroglossal duct cyst N/A 12/10/2012    Procedure: REVISION OF THYROGLOSSAL DUCT CYST EXCISION;  Surgeon: [name], MD;  Location: [facility];  Service: ENT;  Laterality: N/A;  Revision of Thyroglossal Duct Cyst Excision  . Nasal fracture surgery      /w plate   . Carpal tunnel with cubital tunnel Right 07/26/2013    Procedure: RIGHT LIMITED OPEN CARPAL TUNNEL RELEASE ,  RIGHT CUBITAL TUNNEL RELEASE, INSITU VERSES ULNAR NERVE DECOMPRESSION AND ANTERIOR TRANSPOSITION;  Surgeon: [name], MD;  Location: [facility];  Service: Orthopedics;  Laterality: Right;  .  Video bronchoscopy Bilateral 06/19/2014    Procedure: VIDEO BRONCHOSCOPY WITHOUT FLUORO;  Surgeon: Brand Males, MD;  Location: [facility];  Service: Cardiopulmonary;  Laterality: Bilateral;  . Bi-ventricular pacemaker upgrade N/A 03/08/2012    Procedure: BI-VENTRICULAR PACEMAKER UPGRADE;  Surgeon: [name], MD;  Location: [facility] CATH LAB;  Service: Cardiovascular;  Laterality: N/A;  . Atrial tach ablation N/A 08/14/2014    Procedure: ATRIAL TACH ABLATION;  Surgeon: [name], MD;  Location: [facility] CATH LAB;  Service: Cardiovascular;  Laterality: N/A;   Social History: History  Substance Use Topics  . Smoking status: Former Smoker -- 0.25 packs/day for .5 years    Types: Cigarettes    Quit date: 07/25/1996  . Smokeless tobacco: Never Used  . Alcohol Use: No   Additional social history: Former smoker Please also refer to relevant sections of EMR.  Family History: Family History  Problem Relation Age of Onset  . Heart disease Mother     CHF (no details)  . Hypertension Mother   . Heart disease Father     Murmur  . Heart disease Sister 42     No details.  History of a pacemaker  . Lupus Mother    Allergies and Medications: Allergies  Allergen Reactions  . Sertraline Hcl Hives  . Tape Other (See Comments)    Burns skin   No current facility-administered medications on file prior to encounter.   Current Outpatient Prescriptions on File Prior to Encounter  Medication Sig Dispense Refill  . albuterol (PROVENTIL HFA;VENTOLIN HFA) 108 (90 BASE) MCG/ACT inhaler Inhale 2 puffs into the lungs every 4 (four) hours as needed for wheezing or shortness of breath. 1 each 1  . bisoprolol (ZEBETA) 5 MG tablet Take 0.5 tablets (2.5 mg total) by mouth daily. 15 tablet 3  . budesonide-formoterol (SYMBICORT) 80-4.5 MCG/ACT inhaler Inhale 2 puffs into the lungs 2 (two) times daily as needed (for shortness of breath).     . cetirizine (ZYRTEC) 10 MG tablet Take 1 tablet (10 mg total) by  mouth daily. 30 tablet 3  . etonogestrel (NEXPLANON) 68 MG IMPL implant 1 each by Subdermal route once.    . fluticasone (FLONASE) 50 MCG/ACT nasal spray Place 2 sprays into both nostrils daily as needed for allergies or rhinitis.    . furosemide (LASIX) 20 MG tablet Take 1 tablet (20 mg total) by mouth daily. For shortness of breath and/or leg swelling. 30 tablet 0  . losartan (COZAAR) 25 MG tablet Take 1 tablet (25 mg total) by mouth 2 (two) times daily. 60 tablet 3  . potassium chloride SA (K-DUR,KLOR-CON) 20 MEQ tablet Take 20 mEq by mouth daily.    . predniSONE (DELTASONE) 10 MG tablet Take 10 mg by mouth daily with breakfast.     .  spironolactone (ALDACTONE) 25 MG tablet Take 0.5 tablets (12.5 mg total) by mouth daily. 15 tablet 3  . Azelastine-Fluticasone (DYMISTA) 137-50 MCG/ACT SUSP Place 2 sprays into both nostrils at bedtime.      Objective: BP 107/64 mmHg  Pulse 75  Temp(Src) 98.5 F (36.9 C)  Resp 24  Ht _0  (1.676 m)  Wt 224 lb (101.606 kg)  BMI 36.17 kg/m2  SpO2 99% Exam: General: Pleasant, African-American female, no acute distress, nontoxic in appearance, well-developed, well-nourished, morbidly obese. Alert, oriented 3 and cooperative. HEENT: Atraumatic, normocephalic. No ejections or icterus. Mildly dry mucous membranes. Neck: Supple, midline trachea, no lymphadenopathy. Cardiovascular: Regular rate and rhythm, no murmurs appreciated. Respiratory: Clear to auscultation bilaterally, normal breath sounds. No wheezes rhonchi or rales. Abdomen: Soft, obese, nontender nondistended, no masses, bowel sounds present Extremities: No erythema, no edema. +2/4 PT. Skin: Intact, WWP Neuro: Alert, oriented 3, Perla, EOMI. Normal speech. Moving all 4 extremities without difficulty.  Labs and Imaging: CBC BMET   Recent Labs Lab 10/24/14 0513  WBC 6.0  HGB 13.1  HCT 39.8  PLT 232    Recent Labs Lab 10/24/14 0513  NA 134*  K 4.2  CL 99  CO2 25  BUN 12   CREATININE 1.18*  GLUCOSE 676*  CALCIUM 9.6     CBG (last 3)   Recent Labs  10/24/14 0713  GLUCAP 532*   Dg Chest Port 1 View  10/24/2014   CLINICAL DATA:  34 year old female with shortness of breath and dizziness beginning at 0400 hrs. Hyperglycemia. Initial encounter.  EXAM: PORTABLE CHEST - 1 VIEW  COMPARISON:  09/18/2014 and earlier.  FINDINGS: Portable AP semi upright view at 0636 hrs. Continued low lung volumes. Stable cardiac size and mediastinal contours. Left chest cardiac AICD/pacemaker re- identified. Continued basilar predominant increased interstitial opacity, not significantly changed from prior. No pneumothorax. No pleural effusion or consolidation identified. Visualized tracheal air column is within normal limits.  IMPRESSION: Continued basilar predominant patchy and interstitial opacity as seen on prior studies since August 2015 (see also chest CTA report 06/16/2014). No new cardiopulmonary abnormality identified.   Electronically Signed   By: Lars Pinks M.D.   On: 10/24/2014 07:59     Ma Hillock, DO 10/24/2014, 8:00 AM PGY-3, [facility] pager: [phone], text pages welcome

## 2014-10-25 DIAGNOSIS — E1169 Type 2 diabetes mellitus with other specified complication: Secondary | ICD-10-CM | POA: Insufficient documentation

## 2014-10-25 DIAGNOSIS — E669 Obesity, unspecified: Secondary | ICD-10-CM

## 2014-10-25 LAB — GLUCOSE, CAPILLARY
GLUCOSE-CAPILLARY: 376 mg/dL — AB (ref 70–99)
Glucose-Capillary: 219 mg/dL — ABNORMAL HIGH (ref 70–99)
Glucose-Capillary: 258 mg/dL — ABNORMAL HIGH (ref 70–99)
Glucose-Capillary: 286 mg/dL — ABNORMAL HIGH (ref 70–99)
Glucose-Capillary: 324 mg/dL — ABNORMAL HIGH (ref 70–99)
Glucose-Capillary: 352 mg/dL — ABNORMAL HIGH (ref 70–99)

## 2014-10-25 MED ORDER — INSULIN GLARGINE 100 UNIT/ML SOLOSTAR PEN: 10.0000 [IU] | PEN_INJECTOR | Freq: Every day | SUBCUTANEOUS | Status: DC

## 2014-10-25 MED ORDER — GLUCOSE BLOOD VI STRP: ORAL_STRIP | Status: DC

## 2014-10-25 MED ORDER — ACCU-CHEK NANO SMARTVIEW W/DEVICE KIT: PACK | Status: DC

## 2014-10-25 MED ORDER — INSULIN ASPART 100 UNIT/ML ~~LOC~~ SOLN
0.0000 [IU] | Freq: Three times a day (TID) | SUBCUTANEOUS | Status: DC
  Administered 2014-10-25 (×2): 15 [IU] via SUBCUTANEOUS
  Administered 2014-10-26: 8 [IU] via SUBCUTANEOUS
  Administered 2014-10-26: 15 [IU] via SUBCUTANEOUS
  Administered 2014-10-26: 11 [IU] via SUBCUTANEOUS

## 2014-10-25 MED ORDER — ONETOUCH LANCETS MISC: Status: DC

## 2014-10-25 MED ORDER — ACCU-CHEK FASTCLIX LANCET KIT: PACK | Status: DC

## 2014-10-25 MED ORDER — LIVING WELL WITH DIABETES BOOK
Freq: Once | Status: AC
  Administered 2014-10-25: 12:00:00
  Filled 2014-10-25: qty 1

## 2014-10-25 MED ORDER — ONETOUCH ULTRA 2 W/DEVICE KIT: PACK | Status: DC

## 2014-10-25 NOTE — Progress Notes (Signed)
Physical Therapy Treatment Patient Details Name: Makayla Vasquez MRN: [id number] DOB: 12-Jul-1981 Today's Date: 10/25/2014    History of Present Illness Pt admit with hyperglycemia, dizziness and questionable dehydration.      PT Comments    Pt admitted with above diagnosis. Pt currently with functional limitations due to the deficits listed below (see PT Problem List). Pt vertigo resolved.  Pt without LOB with challenges.  Pts only limiting factor is her poor endurance and the fact that she desats even with O2 which was her baseline.  Feel that pt will benefit from a short term Outpt PT sessions at d/c to maximize her endurance.   Pt will benefit from skilled PT to increase their independence and safety with mobility to allow discharge to the venue listed below.    Follow Up Recommendations  Outpatient PT;Supervision - Intermittent     Equipment Recommendations  None recommended by PT (pt does not need RW at this time. )    Recommendations for Other Services       Precautions / Restrictions Precautions Precautions: Fall Restrictions Weight Bearing Restrictions: No    Mobility  Bed Mobility Overal bed mobility: Independent                Transfers Overall transfer level: Needs assistance Equipment used: None Transfers: Sit to/from Stand Sit to Stand: Independent            Ambulation/Gait Ambulation/Gait assistance: Supervision Ambulation Distance (Feet): 200 Feet Assistive device: None Gait Pattern/deviations: WFL(Within Functional Limits)   Gait velocity interpretation: <1.8 ft/sec, indicative of risk for recurrent falls General Gait Details: Pt able to ambulate in hall on 3LO2 without LOB with challenges to balance given.  Pt desat to 79% with 2LO2 which pt states is her baseline.     Stairs            Wheelchair Mobility    Modified Rankin (Stroke Patients Only)       Balance Overall balance assessment: Needs assistance Sitting-balance  support: No upper extremity supported;Feet supported Sitting balance-Leahy Scale: Good     Standing balance support: No upper extremity supported;During functional activity Standing balance-Leahy Scale: Good Standing balance comment: Had no LOB with challenges.               High level balance activites: Direction changes;Turns;Sudden stops;Head turns High Level Balance Comments: Pt can perform balance activities without support and without LOB.      Cognition Arousal/Alertness: Awake/alert Behavior During Therapy: WFL for tasks assessed/performed Overall Cognitive Status: Within Functional Limits for tasks assessed                      Exercises      General Comments General comments (skin integrity, edema, etc.): Pt negative for BPPV today.  Feels no spinning any longer.        Pertinent Vitals/Pain Pain Assessment: No/denies pain  O2 on 3LO2 was 79% with ambulation  which pt states is premorbid.   At rest, O2 was >90% on 3LO2.      Home Living                      Prior Function            PT Goals (current goals can now be found in the care plan section) Progress towards PT goals: Progressing toward goals    Frequency  Min 3X/week    PT Plan Current plan remains appropriate  Co-evaluation             End of Session Equipment Utilized During Treatment: Gait belt;Oxygen Activity Tolerance: Patient limited by fatigue Patient left: in bed;with call bell/phone within reach     Time: 0835-0859 PT Time Calculation (min) (ACUTE ONLY): 24 min  Charges:  $Gait Training: 8-22 mins $Self Care/Home Management: 8-22                    G CodesBerline Lopes 11-04-14, 12:38 PM [name],PT Acute Rehabilitation [phone] (pager)

## 2014-10-25 NOTE — Progress Notes (Signed)
  RD consulted for nutrition education regarding diabetes.   Lab Results  Component Value Date   HGBA1C 9.3* 10/24/2014    RD provided "Carbohydrate Counting for People with Diabetes" handout from the Academy of Nutrition and Dietetics as well as sample menus. Discussed different food groups and their effects on blood sugar, emphasizing carbohydrate-containing foods. Provided list of carbohydrates and recommended serving sizes of common foods.  Discussed importance of controlled and consistent carbohydrate intake throughout the day. Reviewed patient's dietary recall and provided tips. Provided examples of ways to balance meals/snacks and encouraged intake of high-fiber, whole grain complex carbohydrates. Teach back method used. Pt reports drinking too much juice PTA- she plans to cut back.   Expect very good compliance.  Body mass index is 35.57 kg/(m^2). Pt meets criteria for Obesity based on current BMI.  Current diet order is Heart Healthy/Carb Modified, patient is consuming approximately 75-100% of meals at this time. Labs and medications reviewed. No further nutrition interventions warranted at this time. RD contact information provided. If additional nutrition issues arise, please re-consult RD.  Ian Malkin RD, LDN Inpatient Clinical Dietitian Pager: [phone] After Hours Pager: [phone]

## 2014-10-25 NOTE — Consult Note (Signed)
CARDIOLOGY CONSULT NOTE  Patient ID: Makayla Vasquez, MRN: [id number], DOB/AGE: 1981-02-13 34 y.o. Admit date: 10/24/2014 Date of Consult: 10/25/2014  Primary Physician: Saralyn Pilar, DO Primary Cardiologist: Dr Taylor/Dr Gala Romney Referring Physician: Dr Claiborne Billings  Chief Complaint: Dizziness  Reason for Consultation: Dizziness/CHF  HPI: 42 year-ols woman with history of nonischemic CM, LVEF < 30%, and congenital heart block s/p PPM with upgrade to CRT-P. The patient has been recently evaluated in the [facility] with plans to perform right heart cath 1/8 for evaluation of severely reduced DLCO and possible pulmonary HTN.  She has a history of chronic lung disease and has undergone extensive evaluation. She is followed by Dr Maple Hudson. During periods of exacerbation, considerations have included viral pneumonitis, autoimmune processes, and lupus pneumonitis.  She has more recently been treated with empiric steroids after declining lung biopsy.   She has undergone EP study for persistent tachycardia and this demonstrated only sinus tachycardia.  She has been doing reasonably well until the last few days when she has developed dizziness with standing or walking. Her appetite has been poor. She complains of generalized fatigue. She presented to the hospital and had a blood glucose greater than 600. Her metformin was discontinued because of class III heart failure. She had not taken any other medication for diabetes. At the time of my interview, she is feeling much better. Her symptoms have resolved. She denies shortness of breath, chest pain, or further lightheadedness. She has been up on her feet today without problems. She has been transitioned from IV insulin to lantus insulin with a sliding scale.   Medical History:  Past Medical History  Diagnosis Date  . Cardiac pacemaker     a. Since age 85 in 51. b. Upgrade to BiV in 2013.  . Congenital complete AV block   .  Obesity   . GERD (gastroesophageal reflux disease)   . Asymptomatic LV dysfunction     a. Echo in Dec 2011 with EF 35 to 40%. Felt to be due to paced rhythm. b. EF 25-30% in 07/2014.  . Seizures     as a child- from high fever  . Anxiety   . Bipolar affective disorder   . Depression     bipolar  . Carpal tunnel syndrome of right wrist   . Asthma     seasonal allergies   . Arthritis     rheumatoid arthritis- mild, no rheumatology care   . Hypertension   . Pneumonitis     a. a/w hypoxia - inflammatory - large workup 07/2014.  [name] Sinus tachycardia   . Diabetes mellitus without complication   . Presence of permanent cardiac pacemaker       Surgical History:  Past Surgical History  Procedure Laterality Date  . Throat surgery  1994    s/p laser treatment  . Cesarean section    . Insert / replace / remove pacemaker      2001  . Cholecystectomy    . Iud removal  11/03/2011    Procedure: INTRAUTERINE DEVICE (IUD) REMOVAL;  Surgeon: Myra C. Marice Potter, MD;  Location: [facility] ORS;  Service: Gynecology;  Laterality: N/A;  . Cyst excision  12/10/2012    THYROID  . Thyroglossal duct cyst N/A 12/10/2012    Procedure: REVISION OF THYROGLOSSAL DUCT CYST EXCISION;  Surgeon: Serena Colonel, MD;  Location[facility];  Service: ENT;  Laterality: N/A;  Revision of Thyroglossal Duct Cyst Excision  . Nasal fracture surgery      /  w plate   . Carpal tunnel with cubital tunnel Right 07/26/2013    Procedure: RIGHT LIMITED OPEN CARPAL TUNNEL RELEASE ,  RIGHT CUBITAL TUNNEL RELEASE, INSITU VERSES ULNAR NERVE DECOMPRESSION AND ANTERIOR TRANSPOSITION;  Surgeon: Dominica Severin, MD;  Location: [facility] OR;  Service: Orthopedics;  Laterality: Right;  . Video bronchoscopy Bilateral 06/19/2014    Procedure: VIDEO BRONCHOSCOPY WITHOUT FLUORO;  Surgeon: Kalman Shan, MD;  Location: [facility] ENDOSCOPY;  Service: Cardiopulmonary;  Laterality: Bilateral;  . Bi-ventricular pacemaker upgrade N/A 03/08/2012    Procedure: BI-VENTRICULAR PACEMAKER  UPGRADE;  Surgeon: Marinus Maw, MD;  Location: [facility] CATH LAB;  Service: Cardiovascular;  Laterality: N/A;  . Atrial tach ablation N/A 08/14/2014    Procedure: ATRIAL TACH ABLATION;  Surgeon: Marinus Maw, MD;  Location: [facility] CATH LAB;  Service: Cardiovascular;  Laterality: N/A;     Home Meds: Prior to Admission medications   Medication Sig Start Date End Date Taking? Authorizing Provider  albuterol (PROVENTIL HFA;VENTOLIN HFA) 108 (90 BASE) MCG/ACT inhaler Inhale 2 puffs into the lungs every 4 (four) hours as needed for wheezing or shortness of breath. 06/16/14  Yes Saralyn Pilar, DO  bisoprolol (ZEBETA) 5 MG tablet Take 0.5 tablets (2.5 mg total) by mouth daily. 09/18/14  Yes Dolores Patty, MD  budesonide-formoterol (SYMBICORT) 80-4.5 MCG/ACT inhaler Inhale 2 puffs into the lungs 2 (two) times daily as needed (for shortness of breath).  04/06/14  Yes Waymon Budge, MD  cetirizine (ZYRTEC) 10 MG tablet Take 1 tablet (10 mg total) by mouth daily. 05/22/14  Yes Saralyn Pilar, DO  etonogestrel (NEXPLANON) 68 MG IMPL implant 1 each by Subdermal route once.   Yes Historical Provider, MD  fluticasone (FLONASE) 50 MCG/ACT nasal spray Place 2 sprays into both nostrils daily as needed for allergies or rhinitis.   Yes Historical Provider, MD  furosemide (LASIX) 20 MG tablet Take 1 tablet (20 mg total) by mouth daily. For shortness of breath and/or leg swelling. 09/22/14  Yes Jacquelin Hawking, MD  losartan (COZAAR) 25 MG tablet Take 1 tablet (25 mg total) by mouth 2 (two) times daily. 09/28/14  Yes Laurey Morale, MD  potassium chloride SA (K-DUR,KLOR-CON) 20 MEQ tablet Take 20 mEq by mouth daily.   Yes Historical Provider, MD  predniSONE (DELTASONE) 10 MG tablet Take 10 mg by mouth daily with breakfast.    Yes Historical Provider, MD  spironolactone (ALDACTONE) 25 MG tablet Take 0.5 tablets (12.5 mg total) by mouth daily. 09/28/14  Yes Laurey Morale, MD  Azelastine-Fluticasone [facility])  137-50 MCG/ACT SUSP Place 2 sprays into both nostrils at bedtime.    Historical Provider, MD    Inpatient Medications:  . sodium chloride   Intravenous Once  . bisoprolol  2.5 mg Oral Daily  . furosemide  20 mg Oral Daily  . heparin  5,000 Units Subcutaneous 3 times per day  . insulin aspart  0-15 Units Subcutaneous TID WC  . loratadine  10 mg Oral Daily  . losartan  25 mg Oral BID  . potassium chloride SA  20 mEq Oral Daily  . predniSONE  10 mg Oral Q breakfast  . spironolactone  12.5 mg Oral Daily      Allergies:  Allergies  Allergen Reactions  . Sertraline Hcl Hives  . Tape Other (See Comments)    Burns skin    History   Social History  . Marital Status: Single    Spouse Name: N/A    Number of Children: 1  .  Years of Education: N/A   Occupational History  . CNA    Social History Main Topics  . Smoking status: Former Smoker -- 0.25 packs/day for .5 years    Types: Cigarettes    Quit date: 07/25/1996  . Smokeless tobacco: Never Used  . Alcohol Use: No  . Drug Use: No  . Sexual Activity: Yes    Birth Control/ Protection: Implant     Comment: placed 04/2011   Other Topics Concern  . Not on file   Social History Narrative   Works at [employer].       Family History  Problem Relation Age of Onset  . Heart disease Mother     CHF (no details)  . Hypertension Mother   . Heart disease Father     Murmur  . Heart disease Sister 51     No details.  History of a pacemaker  . Lupus Mother      Review of Systems: General: negative for chills, fever, night sweats or weight changes.  ENT: negative for rhinorrhea or epistaxis Cardiovascular: negative for chest pain, shortness of breath, edema, orthopnea, palpitations, or paroxysmal nocturnal dyspnea. Positive for DOE. Dermatological: negative for rash Respiratory: negative for cough or wheezing GI: negative for nausea, vomiting, diarrhea, bright red blood per rectum, melena, or hematemesis GU: no hematuria,  urgency, or frequency Neurologic: negative for visual changes, syncope, headache, positive for dizziness Heme: no easy bruising or bleeding Endo: negative for excessive thirst, thyroid disorder, or flushing Musculoskeletal: negative for joint pain or swelling, negative for myalgias  All other systems reviewed and are otherwise negative except as noted above.  Physical Exam: Blood pressure 100/65, pulse 86, temperature 98.3 F (36.8 C), temperature source Oral, resp. rate 18, height 5\' 6"  (1.676 m), weight 220 lb 4.8 oz (99.927 kg), SpO2 92 %. Pt is alert and oriented, WD, WN, in no distress. HEENT: normal Neck: JVP normal. Carotid upstrokes normal without bruits. No thyromegaly. Lungs: equal expansion, clear bilaterally CV: Apex is discrete and nondisplaced, RRR without murmur or gallop Abd: soft, NT, +BS, no bruit, no hepatosplenomegaly Back: no CVA tenderness Ext: no C/C/E        DP/PT pulses intact and = Skin: warm and dry without rash Neuro: CNII-XII intact             Strength intact = bilaterally    Labs: No results for input(s): CKTOTAL, CKMB, TROPONINI in the last 72 hours. Lab Results  Component Value Date   WBC 6.8 10/24/2014   HGB 13.2 10/24/2014   HCT 39.8 10/24/2014   MCV 86.9 10/24/2014   PLT 227 10/24/2014    Recent Labs Lab 10/24/14 1323  NA 144  K 3.9  CL 108  CO2 26  BUN 9  CREATININE 0.94  CALCIUM 9.8  GLUCOSE 171*   Lab Results  Component Value Date   CHOL 200 10/24/2014   HDL 31* 10/24/2014   LDLCALC 136* 10/24/2014   TRIG 163* 10/24/2014   Lab Results  Component Value Date   DDIMER  06/01/2008    0.34        AT THE INHOUSE ESTABLISHED CUTOFF VALUE OF 0.48 ug/mL FEU, THIS ASSAY HAS BEEN DOCUMENTED IN THE LITERATURE TO HAVE    Radiology/Studies:  Dg Chest Port 1 View  10/24/2014   CLINICAL DATA:  34 year old female with shortness of breath and dizziness beginning at 0400 hrs. Hyperglycemia. Initial encounter.  EXAM: PORTABLE CHEST -  1 VIEW  COMPARISON:  09/18/2014 and  earlier.  FINDINGS: Portable AP semi upright view at 0636 hrs. Continued low lung volumes. Stable cardiac size and mediastinal contours. Left chest cardiac AICD/pacemaker re- identified. Continued basilar predominant increased interstitial opacity, not significantly changed from prior. No pneumothorax. No pleural effusion or consolidation identified. Visualized tracheal air column is within normal limits.  IMPRESSION: Continued basilar predominant patchy and interstitial opacity as seen on prior studies since August 2015 (see also chest CTA report 06/16/2014). No new cardiopulmonary abnormality identified.   Electronically Signed   By: Augusto Gamble M.D.   On: 10/24/2014 07:59    EKG: A-sensed V-paced rhythm 80 bpm  Cardiac Studies: Echo 05/30/2014: Study Conclusions  - Left ventricle: The cavity size was normal. Wall thickness was increased in a pattern of mild LVH. Systolic function was severely reduced. The estimated ejection fraction was in the range of 25% to 30%. Diffuse hypokinesis. Doppler parameters are consistent with abnormal left ventricular relaxation (grade 1 diastolic dysfunction). - Aortic valve: There was no stenosis. - Mitral valve: There was trivial regurgitation. - Left atrium: The atrium was mildly dilated. - Right ventricle: The cavity size was normal. Pacer wire or catheter noted in right ventricle. Systolic function was normal. - Pulmonary arteries: PA peak pressure: 24 mm Hg (S). - Inferior vena cava: The vessel was normal in size. The respirophasic diameter changes were in the normal range (>= 50%), consistent with normal central venous pressure. - Pericardium, extracardiac: A trivial pericardial effusion was identified.  Impressions:  - Normal LV size with mild LV hypertrophy. EF 25-30% with diffuse hypokinesis. Normal RV size and systolic function. No significant valvular abnormalities.  CXR  10/24/2014: IMPRESSION: Continued basilar predominant patchy and interstitial opacity as seen on prior studies since August 2015 (see also chest CTA report 06/16/2014). No new cardiopulmonary abnormality identified.  ASSESSMENT AND PLAN:  1. Chronic systolic heart failure with underlying severe NICM, LVEF < 30%. The patient appears stable from a heart failure perspective. She has New York Heart Association functional class III symptoms. Plans for right heart catheterization per Dr. Gala Romney noted. Will touch base with him to see if he would like to do this test while she is hospitalized. Otherwise she is scheduled for elective right heart catheterization this Friday.  2. Chronic respiratory failure with diffuse lung infiltrates. Seems to be tolerating a prednisone taper and her respiratory status is stable. Evaluation per Dr. Maple Hudson with pulmonology.  3. Congenital high-grade heart block status post permanent pacemaker. She has had an upgrade to a biventricular pacemaker without significant improvement in her LVEF.  4. Dizziness/lightheadedness. Suspect postural hypotension in the setting of marked hyperglycemia and chronic diuretic use. Symptoms resolved with control of her diabetes.  Will touch base with Dr. Gala Romney about scheduling the patient's right heart catheterization. I would recommend continuing her current heart failure medications as you are doing and she appears reasonably well compensated. Right heart catheterization will evaluate for possible pulmonary hypertension and will further investigate her severely reduced DLCO. Further plans pending the results.  Signed, Tonny Bollman MD, [facility] 10/25/2014, 11:15 AM

## 2014-10-25 NOTE — Evaluation (Signed)
Occupational Therapy Evaluation Patient Details Name: KYARAH ENAMORADO MRN: [id number] DOB: 1981/01/07 Today's Date: 10/25/2014    History of Present Illness Pt admit with hyperglycemia, dizziness and questionable dehydration.     Clinical Impression   Pt is performing ADL and ADL transfers at a modified independent level.  Instructed in safety related to dizziness triggered by positional changes.  No further OT needs.    Follow Up Recommendations  No OT follow up    Equipment Recommendations  None recommended by OT    Recommendations for Other Services       Precautions / Restrictions Restrictions Weight Bearing Restrictions: No      Mobility Bed Mobility Overal bed mobility: Independent                Transfers   Equipment used: None Transfers: Sit to/from Stand;Stand Pivot Transfers Sit to Stand: Modified independent (Device/Increase time) Stand pivot transfers: Modified independent (Device/Increase time)       General transfer comment: takes her time due to hx of dizziness    Balance                                            ADL Overall ADL's : Modified independent                                       General ADL Comments: requires intermittent rest breaks due to breathing issues     Vision                     Perception     Praxis      Pertinent Vitals/Pain Pain Assessment: No/denies pain     Hand Dominance Right   Extremity/Trunk Assessment Upper Extremity Assessment Upper Extremity Assessment: Overall WFL for tasks assessed   Lower Extremity Assessment Lower Extremity Assessment: Defer to PT evaluation   Cervical / Trunk Assessment Cervical / Trunk Assessment: Normal   Communication Communication Communication: No difficulties   Cognition Arousal/Alertness: Awake/alert Behavior During Therapy: WFL for tasks assessed/performed Overall Cognitive Status: Within Functional Limits for  tasks assessed                     General Comments       Exercises       Shoulder Instructions      Home Living Family/patient expects to be discharged to:: Private residence Living Arrangements: Children;Non-relatives/Friends Available Help at Discharge: Friend(s);Family;Available PRN/intermittently Type of Home: Apartment Home Access: Level entry     Home Layout: One level               Home Equipment:  (02)   Additional Comments: pt is not clear on the set up of her new home she will discharge to      Prior Functioning/Environment Level of Independence: Independent             OT Diagnosis:     OT Problem List:     OT Treatment/Interventions:      OT Goals(Current goals can be found in the care plan section) Acute Rehab OT Goals Patient Stated Goal: to go home  OT Frequency:     Barriers to D/C:            Co-evaluation  End of Session Equipment Utilized During Treatment: Oxygen  Activity Tolerance: Patient tolerated treatment well Patient left: in bed;with call bell/phone within reach   Time: 0840-0858 OT Time Calculation (min): 18 min Charges:  OT General Charges $OT Visit: 1 Procedure OT Evaluation $Initial OT Evaluation Tier I: 1 Procedure G-Codes:    Evern Bio 10/25/2014, 8:58 AM [phone]

## 2014-10-25 NOTE — Progress Notes (Signed)
Inpatient Diabetes Program Recommendations  AACE/ADA: New Consensus Statement on Inpatient Glycemic Control (2013)  Target Ranges:  Prepandial:   less than 140 mg/dL      Peak postprandial:   less than 180 mg/dL (1-2 hours)      Critically ill patients:  140 - 180 mg/dL   Consult received and basic inpatient DM education has been ordered for bedside RN to initiate. This coordinator will meet with patient to answer questions/concerns and facilitate OP DM education referral. Thank you  Piedad Climes BSN, RN,CDE Inpatient Diabetes Coordinator [phone] (team pager)

## 2014-10-25 NOTE — Progress Notes (Signed)
Family Medicine Teaching Service Daily Progress Note Intern Pager: [phone]  Patient name: Makayla Vasquez Medical record number: [id number] Date of birth: 04-01-1981 Age: 34 y.o. Gender: female  Primary Care Provider: Saralyn Pilar, DO Consultants: Cardiology Code Status: Full  Assessment and Plan: 34 y.o. female presenting with dizziness. PMH is significant for complete AV block and cardiac pacemaker (since age 45 ), chronic systolic heart failure, chronic resp failure, sinus tachycardia, Interstitial lung disease, chronic steroid use, hypertension and prediabetes . She is followed by cardiology and pulmonology. She is scheduled for a cardiac cath in 3 days. She uses 4L O2 at home.   # Hyperglycemia: patient with known history of hyperglycemia, prediabetes, had been on metformin but needed to be discontinued due to cardiac reasoning. Patient on chronic steroid usage 10 mg daily. On admission patient's glucose was 676. - VBG: pH 7.308, pCO2 50.6, Bicarb 25.3, pO2 25 - AG 10 - No electrolyte abnormalities noted - A1C 9.3 - TSH 1.428 - Sensitive Insulin Sliding Scale + Lantus 5U-- Transition to Moderate Sliding Scale today  - Will monitor amount of sliding scale needed today and adjust Lantus dose appropriately - 324 this morning - Diabetes coordinator consult ordered  # Vertigo: - PT/OT Evaluation- Outpatient PT recommended, Vestibular Rehab  - + BPPV left side treated with canalith repositioning maneuver  - Consider walker at discharge  # AV block/pacemaker/hypertension/CHF: patient with pacemaker (St. Jude CRT-P device), since age 71 for congenital AV block, chronic systolic heart failure. NYHA class II sx. Last echocardiogram August 2015: Normal LV size with mild LVH. Ejection fraction 25-30% with diffuse hypokinesis.  - Catheterization planned prior to admission for 1/8 to assess PAH and LVEDP d/t severe restriction on pulmonary function tests - Continue home meds: Zebeta  2.5 mg daily, lasix 20 mg QD, Cozaar 25 mg BID, Aldactone 12.5 mg QD.  - BNP 16.4 - Monitor daily weights, strict I/O  - Weight today 220, improved from 224 at admission - CHF team consulted, we appreciate their recommendations.   # Interstitial lung disease: Dyspnea class IIIB. On 3-4L of Oxygen at home.  - CXR stable from priorwith continued basilar predominately patchy and interstitial opacity - Continue prednisone 10 mg daily.  - Continue inhalers: albuterol, Symbicort, Dymista, Flonase, claritin  FEN/GI: Heart Healthy Carb Modified Prophylaxis: Heparin subcutaneous  Disposition: Admitted to [facility] Medicine Teaching Service  Subjective:  No acute complaints overnight. Continues to note dizziness, however notes it has improved. States Epley maneuver done by PT helped, however when she woke up the dizziness had returned. No further concerns at this time.  Objective: Temp:  [98 F (36.7 C)-98.6 F (37 C)] 98.3 F (36.8 C) (01/06 0526) Pulse Rate:  [73-106] 86 (01/06 0526) Resp:  [18-30] 18 (01/06 0526) BP: (93-116)/(58-79) 100/65 mmHg (01/06 0526) SpO2:  [92 %-100 %] 92 % (01/06 0526) Weight:  [214 lb 15.2 oz (97.5 kg)-220 lb 4.8 oz (99.927 kg)] 220 lb 4.8 oz (99.927 kg) (01/06 0414) Physical Exam: General: 33yo female resting comfortably in no apparent distress Cardiovascular: S1 and S2 noted. No murmurs noted. Regular rate and rhythm. Respiratory: Clear to auscultation bilaterally. No wheezes/rales/rhonchi. No increased work of breathing. Abdomen: Bowel sounds noted. Soft and nondistended. No tenderness to palpation. Extremities: No edema noted.  Laboratory:  Recent Labs Lab 10/24/14 0513 10/24/14 0919  WBC 6.0 6.8  HGB 13.1 13.2  HCT 39.8 39.8  PLT 232 227    Recent Labs Lab 10/24/14 0919 10/24/14 1210 10/24/14 1323  NA 137 138 144  K 3.9 4.6 3.9  CL 104 106 108  CO2 19 19 26   BUN 10 10 9   CREATININE 1.07 0.97 0.94  CALCIUM 9.3 9.5 9.8  GLUCOSE 504*  270* 171*   Lipid Panel     Component Value Date/Time   CHOL 200 10/24/2014 0930   TRIG 163* 10/24/2014 0930   HDL 31* 10/24/2014 0930   CHOLHDL 6.5 10/24/2014 0930   VLDL 33 10/24/2014 0930   LDLCALC 136* 10/24/2014 0930   LDLDIRECT 103* 03/03/2012 1524  - A1C 9.3 - TSH 1.428 - VBG: pH 7.308, pCO2 50.6, Bicarb 25.3, pO2 25 - BNP 16.4  Imaging/Diagnostic Tests: Dg Chest Port 1 View  10/24/2014   CLINICAL DATA:  34 year old female with shortness of breath and dizziness beginning at 0400 hrs. Hyperglycemia. Initial encounter.  EXAM: PORTABLE CHEST - 1 VIEW  COMPARISON:  09/18/2014 and earlier.  FINDINGS: Portable AP semi upright view at 0636 hrs. Continued low lung volumes. Stable cardiac size and mediastinal contours. Left chest cardiac AICD/pacemaker re- identified. Continued basilar predominant increased interstitial opacity, not significantly changed from prior. No pneumothorax. No pleural effusion or consolidation identified. Visualized tracheal air column is within normal limits.  IMPRESSION: Continued basilar predominant patchy and interstitial opacity as seen on prior studies since August 2015 (see also chest CTA report 06/16/2014). No new cardiopulmonary abnormality identified.   Electronically Signed   By: September 2015 M.D.   On: 10/24/2014 07:59   Augusto Gamble, DO 10/25/2014, 6:12 AM PGY-1, [facility] Family Medicine FPTS Intern pager: [phone], text pages welcome

## 2014-10-25 NOTE — Consult Note (Signed)
Met with the patient at bedside to offer [facility] Care Management services as benefit of her insurance and [facility] patient. Patient agreeable to services and will receive post hospital discharge call and will be evaluated for monthly home visits. Consent form signed and packet given. Of note, [facility] Care Management services does not replace or interfere with any services that are arranged by inpatient case management or social work. For additional questions or referrals please contact,  , RN BSN CCM, [facility], Care Management Hospital Liaison at [phone]. 

## 2014-10-25 NOTE — Progress Notes (Signed)
  HF is currently stable. Lung infiltrates persist on CXR. Suspect these are non-cardiac. Will do RHC in am - scheduled for 1st case. Orders written.  Fredrik Mogel,MD 3:23 PM

## 2014-10-25 NOTE — Care Management Note (Signed)
    Page 1 of 2   10/26/2014     4:45:48 PM CARE MANAGEMENT NOTE 10/26/2014  Patient:  Makayla Vasquez, Makayla Vasquez   Account Number:  [id number]  Date Initiated:  10/25/2014  Documentation initiated by:  AMERSON,JULIE  Subjective/Objective Assessment:   Pt adm on 10/24/13 with CHF, Hyperglycemia.  PTA, pt resides at home with family.  She is followed at [facility] CHF clinic.     Action/Plan:   Will follow for dc needs as pt progresses.  Referral to [facility], per MD, and pt agreeable to these community case mgmt services.   Anticipated DC Date:  10/27/2014   Anticipated DC Plan:  HOME/SELF CARE      DC Planning Services  CM consult  OP Neuro Rehab  Other      Choice offered to / List presented to:             Status of service:  Completed, signed off Medicare Important Message given?  NA - LOS <3 / Initial given by admissions (If response is "NO", the following Medicare IM given date fields will be blank) Date Medicare IM given:   Medicare IM given by:   Date Additional Medicare IM given:   Additional Medicare IM given by:    Discharge Disposition:  HOME/SELF CARE  Per UR Regulation:  Reviewed for med. necessity/level of care/duration of stay  If discussed at Long Length of Stay Meetings, dates discussed:    Comments:  Social:  From home with family hx/o 3 admissions over the past 6 months. Coverage:  MCR A, B, MCD [place] [employer]  CM notified MD's: Please see glucometer/DM supplies order form which must be completed for coverage. Form requires ICD-10 codes ; I have added these. Form requires MD signature who is listed as a MCR PROVIDER. Please have MCR approved Provider MD to sign off. Please notify unit when form is complete. Form needs to be faxed back to [facility] Raejean [facility] - N-CM [phone]  [facility] [address] [place], [place] Attention:  Lorin Picket [phone] phone [phone] fax  PT RECS:  OP PT / Vestibular therapy OT RECS:  None Referral sent to [facility] via Epic. AVS updated Follow up with [facility].    Why: Outpatient Vestibular Therapy to start following hospital discharge.   Contact information:   [address] [place], [place]  [address] [phone] phone [phone] fax  Other OP Resources: CHF Clinic

## 2014-10-26 ENCOUNTER — Encounter (HOSPITAL_COMMUNITY): Payer: Self-pay | Admitting: *Deleted

## 2014-10-26 ENCOUNTER — Encounter (HOSPITAL_COMMUNITY)
Admission: EM | Disposition: A | Discharge status: Home / Self Care | Payer: Medicare Other | Source: Home / Self Care | Attending: Family Medicine

## 2014-10-26 DIAGNOSIS — I509 Heart failure, unspecified: Secondary | ICD-10-CM | POA: Insufficient documentation

## 2014-10-26 DIAGNOSIS — I5022 Chronic systolic (congestive) heart failure: Secondary | ICD-10-CM

## 2014-10-26 HISTORY — PX: RIGHT HEART CATHETERIZATION: SHX5447

## 2014-10-26 LAB — CBC
HEMATOCRIT: 37.2 % (ref 36.0–46.0)
HEMOGLOBIN: 12.6 g/dL (ref 12.0–15.0)
MCH: 29.3 pg (ref 26.0–34.0)
MCHC: 33.9 g/dL (ref 30.0–36.0)
MCV: 86.5 fL (ref 78.0–100.0)
Platelets: 210 10*3/uL (ref 150–400)
RBC: 4.3 MIL/uL (ref 3.87–5.11)
RDW: 13.5 % (ref 11.5–15.5)
WBC: 6.3 10*3/uL (ref 4.0–10.5)

## 2014-10-26 LAB — GLUCOSE, CAPILLARY
GLUCOSE-CAPILLARY: 317 mg/dL — AB (ref 70–99)
Glucose-Capillary: 256 mg/dL — ABNORMAL HIGH (ref 70–99)
Glucose-Capillary: 420 mg/dL — ABNORMAL HIGH (ref 70–99)
Glucose-Capillary: 408 mg/dL — ABNORMAL HIGH (ref 70–99)
Glucose-Capillary: 391 mg/dL — ABNORMAL HIGH (ref 70–99)
Glucose-Capillary: 304 mg/dL — ABNORMAL HIGH (ref 70–99)
Glucose-Capillary: 145 mg/dL — ABNORMAL HIGH (ref 70–99)

## 2014-10-26 LAB — CREATININE, SERUM
CREATININE: 0.87 mg/dL (ref 0.50–1.10)
GFR calc non Af Amer: 86 mL/min — ABNORMAL LOW (ref >90)

## 2014-10-26 LAB — PROTIME-INR
INR: 1 (ref 0.00–1.49)
Prothrombin Time: 13.3 s (ref 11.6–15.2)

## 2014-10-26 LAB — PREGNANCY, URINE: PREG TEST UR: NEGATIVE

## 2014-10-26 SURGERY — RIGHT HEART CATH
Anesthesia: LOCAL

## 2014-10-26 MED ORDER — SODIUM CHLORIDE 0.9 % IV SOLN: 250.0000 mL | INTRAVENOUS | Status: DC | PRN

## 2014-10-26 MED ORDER — HEPARIN SODIUM (PORCINE) 5000 UNIT/ML IJ SOLN: 5000.0000 [IU] | Freq: Three times a day (TID) | INTRAMUSCULAR | Status: DC

## 2014-10-26 MED ORDER — INSULIN ASPART 100 UNIT/ML ~~LOC~~ SOLN
10.0000 [IU] | Freq: Once | SUBCUTANEOUS | Status: AC
Start: 2014-10-26 — End: 2014-10-26
  Administered 2014-10-26: 10 [IU] via SUBCUTANEOUS

## 2014-10-26 MED ORDER — ASPIRIN 81 MG PO CHEW
81.0000 mg | CHEWABLE_TABLET | ORAL | Status: AC
  Administered 2014-10-26: 81 mg via ORAL
  Filled 2014-10-26: qty 1

## 2014-10-26 MED ORDER — INSULIN STARTER KIT- PEN NEEDLES (ENGLISH)
1.0000 | Freq: Once | Status: AC
Start: 2014-10-26 — End: 2014-10-26
  Administered 2014-10-26: 1
  Filled 2014-10-26: qty 1

## 2014-10-26 MED ORDER — INSULIN GLARGINE 100 UNIT/ML ~~LOC~~ SOLN
10.0000 [IU] | Freq: Every day | SUBCUTANEOUS | Status: DC
  Administered 2014-10-26: 10 [IU] via SUBCUTANEOUS
  Filled 2014-10-26: qty 0.1

## 2014-10-26 MED ORDER — ACETAMINOPHEN 325 MG PO TABS: 650.0000 mg | ORAL_TABLET | ORAL | Status: DC | PRN

## 2014-10-26 MED ORDER — ONDANSETRON HCL 4 MG/2ML IJ SOLN: 4.0000 mg | Freq: Four times a day (QID) | INTRAMUSCULAR | Status: DC | PRN

## 2014-10-26 MED ORDER — SODIUM CHLORIDE 0.9 % IJ SOLN
3.0000 mL | Freq: Two times a day (BID) | INTRAMUSCULAR | Status: DC
  Administered 2014-10-26: 3 mL via INTRAVENOUS

## 2014-10-26 MED ORDER — SODIUM CHLORIDE 0.9 % IJ SOLN: 3.0000 mL | INTRAMUSCULAR | Status: DC | PRN

## 2014-10-26 MED ORDER — ASPIRIN 81 MG PO CHEW: 81.0000 mg | CHEWABLE_TABLET | ORAL | Status: DC

## 2014-10-26 NOTE — Discharge Instructions (Signed)
Wait until tomorrow to start your lantus.  I have included information below concerning diabetes and insulin injections. Please check your sugar once a day before you eat. Each day your sugar is over 150, please increase your Lantus dose by 1unit. Please take your Lantus at the same time every night. Please follow up with your family medicine doctor as scheduled so he can adjust your insulin regimen. If you have any questions or concerns about your blood sugar or insulin injections, please feel free to call the family medicine clinic. If you have blood sugars of over 400, please call the clinic at [phone].  If you develop symptoms of low blood sugar (sweating, confusion, palpitations, tremors), please check your blood sugar and if less than 70 consume 1/2cup juice/soda (not diet), 4 glucose tablets, or 1 tablespoon sugar/honey. Please recheck your blood sugar 27mnutes later and if it is still less than 70, please repeat above. Please call the clinic to let [place] if you have any episodes of low blood sugar.  I am glad your dizziness is improving. I recommend Vestibular Rehab to help further improve your dizziness. I have included information concerning your dizziness below (Benign Positional Vertigo).  How and Where to Give Subcutaneous Insulin Injections, Adult People with type 1 diabetes must take insulin since their bodies do not make it. People with type 2 diabetes may require insulin. There are many different types of insulin as well as other injectable diabetes medicines that are meant to be injected into the fat layer under your skin. The type of insulin or injectable diabetes medicine you take may determine how many injections you give yourself and when to take the injections.  CHOOSING A SITE FOR INJECTION Insulin absorption varies from site to site. As with any injectable medication it is best for the insulin to be injected within the same body region. However, do not inject the insulin in  the same spot each time. Rotating the spots you give your injections will prevent inflammation or tissue breakdown. There are four main regions that can be used for injections. The regions include the:  Abdomen (preferred region, especially for non-insulin injectable diabetes medicine).  Front and upper outer sides of thighs.  Back of upper arm.  Buttocks. USING A SYRINGE AND VIAL Drawing up insulin: single insulin dose  Wash your hands with soap and water.  Gently roll the insulin bottle (vial) between your hands to mix it. Do not shake the vial.  Clean the top rubber part of the vial with an alcohol wipe. Be sure that the plastic pop-top has been removed on newer vials.  Remove the plastic cover from the needle on the syringe. Do not let the needle touch anything.  Pull the plunger back to draw air into the syringe. The air should be the same amount as the insulin dose.  Push the needle through the rubber on the top of the vial. Do not turn the vial over.  Push the plunger in all the way to put the air into the vial.  Leave the needle in the vial and turn the vial and syringe upside down.  Pull down slowly on the plunger, drawing the amount of insulin you need into the syringe.  Look for air bubbles in the syringe. You may need to push the plunger up and down 2 to 3 times to slowly get rid of any air bubbles in the syringe.  Pull back the plunger to get your correct dose.  Remove  the needle from the vial.  Use an alcohol wipe to clean the area of the body to be injected.  Pinch up 1 inch of skin and hold it.  Put the needle straight into the skin (90-degree angle). Put the needle in as far as it will go (to the hub). The needle may need to be injected at a 45-degree angle in small adults with little fat.  When the needle is in, you can let go of your skin.  Push the plunger down all the way to inject the insulin.  Pull the needle straight out of the skin.  Press the  alcohol wipe over the spot where you gave your injection. Keep it there for a few seconds. Do not rub the area.  Do not put the plastic cover back on the needle. Drawing up insulin: mixing 2 insulins  Wash your hands with soap and water.  Gently roll the vial of "cloudy" insulin between your hands or rotate the vial from top to bottom to mix.  Clean the top of both vials with an alcohol wipe. Be sure that the plastic pop-top lid has been removed on newer vials.  Pull air into the syringe to equal the dose of "cloudy" insulin.  Stick the needle into the "cloudy" insulin vial and inject the air. Be sure to keep the vial upright.  Remove the needle from the "cloudy" insulin vial.  Pull air into the syringe to equal the dose of "clear" insulin.  Stick the needle into the "clear" insulin vial and inject the air.  Leave the needle in the "clear" insulin vial and turn the vial upside down.  Pull down on the plunger and slowly draw into the syringe the number of units of "clear" insulin desired.  Look for air bubbles in the syringe. You may need to push the plunger up and down 2 to 3 times to slowly get rid of any air bubbles in the syringe.  Remove the needle from the "clear" insulin vial.  Stick the needle into the "cloudy" insulin vial. Do not inject any of the "clear" insulin into the "cloudy" vial.  Turn the "cloudy" vial upside down and pull the plunger down to the number of units that equals the total number of units of "clear" and "cloudy" insulins.  Remove the needle from the "cloudy" insulin vial.  Use an alcohol wipe to clean the area of the body to be injected.  Put the needle straight into the skin (90-degree angle). Put the needle in as far as it will go (to the hub). The needle may need to be injected at a 45-degree angle in small adults with little fat.  When the needle is in, you can let go of your skin.  Push the plunger down all the way to inject the  insulin.  Pull the needle straight out of the skin.  Press the alcohol wipe over the spot where you gave your injection. Keep it there for a few seconds. Do not rub the area.  Do not put the plastic cover back on the needle. USING INSULIN PENS  Wash your hands with soap and water.  If you are using the "cloudy" insulin, roll the pen between your palms several times or rotate the pen top to bottom several times.  Remove the insulin pen cap.  Clean the rubber stopper of the cartridge with an alcohol wipe.  Remove the protective paper tab from the disposable needle.  Screw the needle onto the  pen.  Remove the outer plastic needle cover.  Remove the inner plastic needle cover.  Prime the insulin pen by turning the button (dial) to 2 units. Hold the pen with the needle pointing up, and push the dial on the opposite end until a drop of insulin appears at the needle tip. If no insulin appears, repeat this step.  Dial the number of units of insulin you will inject.  Use an alcohol wipe to clean the area of the body to be injected.  Pinch up 1 inch of skin and hold it.  Put the needle straight into the skin (90-degree angle).  Push the dial down to push the insulin into the fat tissue.  Count to 10 slowly. Then, remove the needle from the fat tissue.  Carefully replace the larger outer plastic needle cover over the needle and unscrew the capped needle. THROWING AWAY SUPPLIES  Discard used needles in a puncture proof sharps disposal container. Follow disposal regulations for the area where you live.  Vials and empty disposable pens may be thrown away in the regular trash. Document Released: 12/27/2003 Document Revised: 02/20/2014 Document Reviewed: 03/15/2013 [facility] Patient Information 2015 [place], [place]. This information is not intended to replace advice given to you by your health care provider. Make sure you discuss any questions you have with your health care  provider.  Diabetes and Standards of Medical Care Diabetes is complicated. You may find that your diabetes team includes a dietitian, nurse, diabetes educator, eye doctor, and more. To help everyone know what is going on and to help you get the care you deserve, the following schedule of care was developed to help keep you on track. Below are the tests, exams, vaccines, medicines, education, and plans you will need. HbA1c test This test shows how well you have controlled your glucose over the past 2-3 months. It is used to see if your diabetes management plan needs to be adjusted.   It is performed at least 2 times a year if you are meeting treatment goals.  It is performed 4 times a year if therapy has changed or if you are not meeting treatment goals. Blood pressure test  This test is performed at every routine medical visit. The goal is less than 140/90 mm Hg for most people, but 130/80 mm Hg in some cases. Ask your health care provider about your goal. Dental exam  Follow up with the dentist regularly. Eye exam  If you are diagnosed with type 1 diabetes as a child, get an exam upon reaching the age of 80 years or older and have had diabetes for 3-5 years. Yearly eye exams are recommended after that initial eye exam.  If you are diagnosed with type 1 diabetes as an adult, get an exam within 5 years of diagnosis and then yearly.  If you are diagnosed with type 2 diabetes, get an exam as soon as possible after the diagnosis and then yearly. Foot care exam  Visual foot exams are performed at every routine medical visit. The exams check for cuts, injuries, or other problems with the feet.  A comprehensive foot exam should be done yearly. This includes visual inspection as well as assessing foot pulses and testing for loss of sensation.  Check your feet nightly for cuts, injuries, or other problems with your feet. Tell your health care provider if anything is not healing. Kidney function  test (urine microalbumin)  This test is performed once a year.  Type 1 diabetes: The  first test is performed 5 years after diagnosis.  Type 2 diabetes: The first test is performed at the time of diagnosis.  A serum creatinine and estimated glomerular filtration rate (eGFR) test is done once a year to assess the level of chronic kidney disease (CKD), if present. Lipid profile (cholesterol, HDL, LDL, triglycerides)  Performed every 5 years for most people.  The goal for LDL is less than 100 mg/dL. If you are at high risk, the goal is less than 70 mg/dL.  The goal for HDL is 40 mg/dL-50 mg/dL for men and 50 mg/dL-60 mg/dL for women. An HDL cholesterol of 60 mg/dL or higher gives some protection against heart disease.  The goal for triglycerides is less than 150 mg/dL. Influenza vaccine, pneumococcal vaccine, and hepatitis B vaccine  The influenza vaccine is recommended yearly.  It is recommended that people with diabetes who are over 75 years old get the pneumonia vaccine. In some cases, two separate shots may be given. Ask your health care provider if your pneumonia vaccination is up to date.  The hepatitis B vaccine is also recommended for adults with diabetes. Diabetes self-management education  Education is recommended at diagnosis and ongoing as needed. Treatment plan  Your treatment plan is reviewed at every medical visit. Document Released: 08/03/2009 Document Revised: 02/20/2014 Document Reviewed: 03/08/2013 [facility] Patient Information 2015 [place], [place]. This information is not intended to replace advice given to you by your health care provider. Make sure you discuss any questions you have with your health care provider.  Benign Positional Vertigo Vertigo means you feel like you or your surroundings are moving when they are not. Benign positional vertigo is the most common form of vertigo. Benign means that the cause of your condition is not serious. Benign positional  vertigo is more common in older adults. CAUSES  Benign positional vertigo is the result of an upset in the labyrinth system. This is an area in the middle ear that helps control your balance. This may be caused by a viral infection, head injury, or repetitive motion. However, often no specific cause is found. SYMPTOMS  Symptoms of benign positional vertigo occur when you move your head or eyes in different directions. Some of the symptoms may include:  Loss of balance and falls.  Vomiting.  Blurred vision.  Dizziness.  Nausea.  Involuntary eye movements (nystagmus). DIAGNOSIS  Benign positional vertigo is usually diagnosed by physical exam. If the specific cause of your benign positional vertigo is unknown, your caregiver may perform imaging tests, such as magnetic resonance imaging (MRI) or computed tomography (CT). TREATMENT  Your caregiver may recommend movements or procedures to correct the benign positional vertigo. Medicines such as meclizine, benzodiazepines, and medicines for nausea may be used to treat your symptoms. In rare cases, if your symptoms are caused by certain conditions that affect the inner ear, you may need surgery. HOME CARE INSTRUCTIONS   Follow your caregiver's instructions.  Move slowly. Do not make sudden body or head movements.  Avoid driving.  Avoid operating heavy machinery.  Avoid performing any tasks that would be dangerous to you or others during a vertigo episode.  Drink enough fluids to keep your urine clear or pale yellow. SEEK IMMEDIATE MEDICAL CARE IF:   You develop problems with walking, weakness, numbness, or using your arms, hands, or legs.  You have difficulty speaking.  You develop severe headaches.  Your nausea or vomiting continues or gets worse.  You develop visual changes.  Your family or  friends notice any behavioral changes.  Your condition gets worse.  You have a fever.  You develop a stiff neck or sensitivity to  light. MAKE SURE YOU:   Understand these instructions.  Will watch your condition.  Will get help right away if you are not doing well or get worse. Document Released: 07/14/2006 Document Revised: 12/29/2011 Document Reviewed: 06/26/2011 [facility] Patient Information 2015 [place], [place]. This information is not intended to replace advice given to you by your health care provider. Make sure you discuss any questions you have with your health care provider.

## 2014-10-26 NOTE — Progress Notes (Signed)
Informed Dr. Pollie Meyer that pt's BS 317mg /dl.  Instructed ok to give SSI as ordered and recheck q 1 hour until pt is d/c tonite .  Gwendy Boeder,RN.

## 2014-10-26 NOTE — H&P (View-Only) (Signed)
  CARDIOLOGY CONSULT NOTE  Patient ID: Makayla Vasquez, MRN: [id number], DOB/AGE: 11/15/1980 33 y.o. Admit date: 10/24/2014 Date of Consult: 10/25/2014  Primary Physician: Karamalegos, Alexander, DO Primary Cardiologist: Dr Taylor/Dr Bensimhon Referring Physician: Dr Kuneff  Chief Complaint: Dizziness  Reason for Consultation: Dizziness/CHF  HPI: 33 year-ols woman with history of nonischemic CM, LVEF < 30%, and congenital heart block s/p PPM with upgrade to CRT-P. The patient has been recently evaluated in the [facility] with plans to perform right heart cath 1/8 for evaluation of severely reduced DLCO and possible pulmonary HTN.  She has a history of chronic lung disease and has undergone extensive evaluation. She is followed by Dr Young. During periods of exacerbation, considerations have included viral pneumonitis, autoimmune processes, and lupus pneumonitis.  She has more recently been treated with empiric steroids after declining lung biopsy.   She has undergone EP study for persistent tachycardia and this demonstrated only sinus tachycardia.  She has been doing reasonably well until the last few days when she has developed dizziness with standing or walking. Her appetite has been poor. She complains of generalized fatigue. She presented to the hospital and had a blood glucose greater than 600. Her metformin was discontinued because of class III heart failure. She had not taken any other medication for diabetes. At the time of my interview, she is feeling much better. Her symptoms have resolved. She denies shortness of breath, chest pain, or further lightheadedness. She has been up on her feet today without problems. She has been transitioned from IV insulin to lantus insulin with a sliding scale.   Medical History:  Past Medical History  Diagnosis Date  . Cardiac pacemaker     a. Since age 15 in 1997. b. Upgrade to BiV in 2013.  . Congenital complete AV block   .  Obesity   . GERD (gastroesophageal reflux disease)   . Asymptomatic LV dysfunction     a. Echo in Dec 2011 with EF 35 to 40%. Felt to be due to paced rhythm. b. EF 25-30% in 07/2014.  . Seizures     as a child- from high fever  . Anxiety   . Bipolar affective disorder   . Depression     bipolar  . Carpal tunnel syndrome of right wrist   . Asthma     seasonal allergies   . Arthritis     rheumatoid arthritis- mild, no rheumatology care   . Hypertension   . Pneumonitis     a. a/w hypoxia - inflammatory - large workup 07/2014.  . Sinus tachycardia   . Diabetes mellitus without complication   . Presence of permanent cardiac pacemaker       Surgical History:  Past Surgical History  Procedure Laterality Date  . Throat surgery  1994    s/p laser treatment  . Cesarean section    . Insert / replace / remove pacemaker      2001  . Cholecystectomy    . Iud removal  11/03/2011    Procedure: INTRAUTERINE DEVICE (IUD) REMOVAL;  Surgeon: Myra C. Dove, MD;  Location: [facility];  Service: Gynecology;  Laterality: N/A;  . Cyst excision  12/10/2012    THYROID  . Thyroglossal duct cyst N/A 12/10/2012    Procedure: REVISION OF THYROGLOSSAL DUCT CYST EXCISION;  Surgeon: Jefry Rosen, MD;  Location: [facility] OR;  Service: ENT;  Laterality: N/A;  Revision of Thyroglossal Duct Cyst Excision  . Nasal fracture surgery      /  w plate   . Carpal tunnel with cubital tunnel Right 07/26/2013    Procedure: RIGHT LIMITED OPEN CARPAL TUNNEL RELEASE ,  RIGHT CUBITAL TUNNEL RELEASE, INSITU VERSES ULNAR NERVE DECOMPRESSION AND ANTERIOR TRANSPOSITION;  Surgeon: William Gramig, MD;  Location: [facility] OR;  Service: Orthopedics;  Laterality: Right;  . Video bronchoscopy Bilateral 06/19/2014    Procedure: VIDEO BRONCHOSCOPY WITHOUT FLUORO;  Surgeon: Murali Ramaswamy, MD;  Location: [facility];  Service: Cardiopulmonary;  Laterality: Bilateral;  . Bi-ventricular pacemaker upgrade N/A 03/08/2012    Procedure: BI-VENTRICULAR PACEMAKER  UPGRADE;  Surgeon: Gregg W Taylor, MD;  Location: [facility];  Service: Cardiovascular;  Laterality: N/A;  . Atrial tach ablation N/A 08/14/2014    Procedure: ATRIAL TACH ABLATION;  Surgeon: Gregg W Taylor, MD;  Location: [facility];  Service: Cardiovascular;  Laterality: N/A;     Home Meds: Prior to Admission medications   Medication Sig Start Date End Date Taking? Authorizing Provider  albuterol (PROVENTIL HFA;VENTOLIN HFA) 108 (90 BASE) MCG/ACT inhaler Inhale 2 puffs into the lungs every 4 (four) hours as needed for wheezing or shortness of breath. 06/16/14  Yes Alexander Karamalegos, DO  bisoprolol (ZEBETA) 5 MG tablet Take 0.5 tablets (2.5 mg total) by mouth daily. 09/18/14  Yes Daniel R Bensimhon, MD  budesonide-formoterol (SYMBICORT) 80-4.5 MCG/ACT inhaler Inhale 2 puffs into the lungs 2 (two) times daily as needed (for shortness of breath).  04/06/14  Yes Clinton D Young, MD  cetirizine (ZYRTEC) 10 MG tablet Take 1 tablet (10 mg total) by mouth daily. 05/22/14  Yes Alexander Karamalegos, DO  etonogestrel (NEXPLANON) 68 MG IMPL implant 1 each by Subdermal route once.   Yes Historical Provider, MD  fluticasone (FLONASE) 50 MCG/ACT nasal spray Place 2 sprays into both nostrils daily as needed for allergies or rhinitis.   Yes Historical Provider, MD  furosemide (LASIX) 20 MG tablet Take 1 tablet (20 mg total) by mouth daily. For shortness of breath and/or leg swelling. 09/22/14  Yes Ralph Nettey, MD  losartan (COZAAR) 25 MG tablet Take 1 tablet (25 mg total) by mouth 2 (two) times daily. 09/28/14  Yes Dalton S McLean, MD  potassium chloride SA (K-DUR,KLOR-CON) 20 MEQ tablet Take 20 mEq by mouth daily.   Yes Historical Provider, MD  predniSONE (DELTASONE) 10 MG tablet Take 10 mg by mouth daily with breakfast.    Yes Historical Provider, MD  spironolactone (ALDACTONE) 25 MG tablet Take 0.5 tablets (12.5 mg total) by mouth daily. 09/28/14  Yes Dalton S McLean, MD  Azelastine-Fluticasone (DYMISTA)  137-50 MCG/ACT SUSP Place 2 sprays into both nostrils at bedtime.    Historical Provider, MD    Inpatient Medications:  . sodium chloride   Intravenous Once  . bisoprolol  2.5 mg Oral Daily  . furosemide  20 mg Oral Daily  . heparin  5,000 Units Subcutaneous 3 times per day  . insulin aspart  0-15 Units Subcutaneous TID WC  . loratadine  10 mg Oral Daily  . losartan  25 mg Oral BID  . potassium chloride SA  20 mEq Oral Daily  . predniSONE  10 mg Oral Q breakfast  . spironolactone  12.5 mg Oral Daily      Allergies:  Allergies  Allergen Reactions  . Sertraline Hcl Hives  . Tape Other (See Comments)    Burns skin    History   Social History  . Marital Status: Single    Spouse Name: N/A    Number of Children: 1  .   Years of Education: N/A   Occupational History  . CNA    Social History Main Topics  . Smoking status: Former Smoker -- 0.25 packs/day for .5 years    Types: Cigarettes    Quit date: 07/25/1996  . Smokeless tobacco: Never Used  . Alcohol Use: No  . Drug Use: No  . Sexual Activity: Yes    Birth Control/ Protection: Implant     Comment: placed 04/2011   Other Topics Concern  . Not on file   Social History Narrative   Works at [employer].       Family History  Problem Relation Age of Onset  . Heart disease Mother     CHF (no details)  . Hypertension Mother   . Heart disease Father     Murmur  . Heart disease Sister 34     No details.  History of a pacemaker  . Lupus Mother      Review of Systems: General: negative for chills, fever, night sweats or weight changes.  ENT: negative for rhinorrhea or epistaxis Cardiovascular: negative for chest pain, shortness of breath, edema, orthopnea, palpitations, or paroxysmal nocturnal dyspnea. Positive for DOE. Dermatological: negative for rash Respiratory: negative for cough or wheezing GI: negative for nausea, vomiting, diarrhea, bright red blood per rectum, melena, or hematemesis GU: no hematuria,  urgency, or frequency Neurologic: negative for visual changes, syncope, headache, positive for dizziness Heme: no easy bruising or bleeding Endo: negative for excessive thirst, thyroid disorder, or flushing Musculoskeletal: negative for joint pain or swelling, negative for myalgias  All other systems reviewed and are otherwise negative except as noted above.  Physical Exam: Blood pressure 100/65, pulse 86, temperature 98.3 F (36.8 C), temperature source Oral, resp. rate 18, height 5' 6" (1.676 m), weight 220 lb 4.8 oz (99.927 kg), SpO2 92 %. Pt is alert and oriented, WD, WN, in no distress. HEENT: normal Neck: JVP normal. Carotid upstrokes normal without bruits. No thyromegaly. Lungs: equal expansion, clear bilaterally CV: Apex is discrete and nondisplaced, RRR without murmur or gallop Abd: soft, NT, +BS, no bruit, no hepatosplenomegaly Back: no CVA tenderness Ext: no C/C/E        DP/PT pulses intact and = Skin: warm and dry without rash Neuro: CNII-XII intact             Strength intact = bilaterally    Labs: No results for input(s): CKTOTAL, CKMB, TROPONINI in the last 72 hours. Lab Results  Component Value Date   WBC 6.8 10/24/2014   HGB 13.2 10/24/2014   HCT 39.8 10/24/2014   MCV 86.9 10/24/2014   PLT 227 10/24/2014    Recent Labs Lab 10/24/14 1323  NA 144  K 3.9  CL 108  CO2 26  BUN 9  CREATININE 0.94  CALCIUM 9.8  GLUCOSE 171*   Lab Results  Component Value Date   CHOL 200 10/24/2014   HDL 31* 10/24/2014   LDLCALC 136* 10/24/2014   TRIG 163* 10/24/2014   Lab Results  Component Value Date   DDIMER  06/01/2008    0.34        AT THE INHOUSE ESTABLISHED CUTOFF VALUE OF 0.48 ug/mL FEU, THIS ASSAY HAS BEEN DOCUMENTED IN THE LITERATURE TO HAVE    Radiology/Studies:  Dg Chest Port 1 View  10/24/2014   CLINICAL DATA:  33-year-old female with shortness of breath and dizziness beginning at 0400 hrs. Hyperglycemia. Initial encounter.  EXAM: PORTABLE CHEST -  1 VIEW  COMPARISON:  09/18/2014 and   earlier.  FINDINGS: Portable AP semi upright view at 0636 hrs. Continued low lung volumes. Stable cardiac size and mediastinal contours. Left chest cardiac AICD/pacemaker re- identified. Continued basilar predominant increased interstitial opacity, not significantly changed from prior. No pneumothorax. No pleural effusion or consolidation identified. Visualized tracheal air column is within normal limits.  IMPRESSION: Continued basilar predominant patchy and interstitial opacity as seen on prior studies since August 2015 (see also chest CTA report 06/16/2014). No new cardiopulmonary abnormality identified.   Electronically Signed   By: Lee  Hall M.D.   On: 10/24/2014 07:59    EKG: A-sensed V-paced rhythm 80 bpm  Cardiac Studies: Echo 05/30/2014: Study Conclusions  - Left ventricle: The cavity size was normal. Wall thickness was increased in a pattern of mild LVH. Systolic function was severely reduced. The estimated ejection fraction was in the range of 25% to 30%. Diffuse hypokinesis. Doppler parameters are consistent with abnormal left ventricular relaxation (grade 1 diastolic dysfunction). - Aortic valve: There was no stenosis. - Mitral valve: There was trivial regurgitation. - Left atrium: The atrium was mildly dilated. - Right ventricle: The cavity size was normal. Pacer wire or catheter noted in right ventricle. Systolic function was normal. - Pulmonary arteries: PA peak pressure: 24 mm Hg (S). - Inferior vena cava: The vessel was normal in size. The respirophasic diameter changes were in the normal range (>= 50%), consistent with normal central venous pressure. - Pericardium, extracardiac: A trivial pericardial effusion was identified.  Impressions:  - Normal LV size with mild LV hypertrophy. EF 25-30% with diffuse hypokinesis. Normal RV size and systolic function. No significant valvular abnormalities.  CXR  10/24/2014: IMPRESSION: Continued basilar predominant patchy and interstitial opacity as seen on prior studies since August 2015 (see also chest CTA report 06/16/2014). No new cardiopulmonary abnormality identified.  ASSESSMENT AND PLAN:  1. Chronic systolic heart failure with underlying severe NICM, LVEF < 30%. The patient appears stable from a heart failure perspective. She has New York Heart Association functional class III symptoms. Plans for right heart catheterization per Dr. Bensimhon noted. Will touch base with him to see if he would like to do this test while she is hospitalized. Otherwise she is scheduled for elective right heart catheterization this Friday.  2. Chronic respiratory failure with diffuse lung infiltrates. Seems to be tolerating a prednisone taper and her respiratory status is stable. Evaluation per Dr. Young with pulmonology.  3. Congenital high-grade heart block status post permanent pacemaker. She has had an upgrade to a biventricular pacemaker without significant improvement in her LVEF.  4. Dizziness/lightheadedness. Suspect postural hypotension in the setting of marked hyperglycemia and chronic diuretic use. Symptoms resolved with control of her diabetes.  Will touch base with Dr. Bensimhon about scheduling the patient's right heart catheterization. I would recommend continuing her current heart failure medications as you are doing and she appears reasonably well compensated. Right heart catheterization will evaluate for possible pulmonary hypertension and will further investigate her severely reduced DLCO. Further plans pending the results.  Signed, Glendale Youngblood MD, FACC 10/25/2014, 11:15 AM 

## 2014-10-26 NOTE — Interval H&P Note (Signed)
History and Physical Interval Note:  10/26/2014 8:11 AM  [name]  has presented today for surgery, with the diagnosis of heart failure  The various methods of treatment have been discussed with the patient and family. After consideration of risks, benefits and other options for treatment, the patient has consented to  Procedure(s): RIGHT HEART CATH (N/A) as a surgical intervention .  The patient's history has been reviewed, patient examined, no change in status, stable for surgery.  I have reviewed the patient's chart and labs.  Questions were answered to the patient's satisfaction.     Makayla Vasquez

## 2014-10-26 NOTE — Progress Notes (Signed)
Family Medicine Teaching Service Daily Progress Note Intern Pager: [phone]  Patient name: Makayla Vasquez Medical record number: [id number] Date of birth: Jun 16, 1981 Age: 34 y.o. Gender: female  Primary Care Provider: Saralyn Pilar, DO Consultants: Cardiology Code Status: Full  Assessment and Plan: 34 y.o. female presenting with dizziness. PMH is significant for complete AV block and cardiac pacemaker (since age 34 ), chronic systolic heart failure, chronic resp failure, sinus tachycardia, Interstitial lung disease, chronic steroid use, hypertension and prediabetes . She is followed by cardiology and pulmonology. She uses 4L O2 at home.   # Hyperglycemia: patient with known history of hyperglycemia, prediabetes, had been on metformin but needed to be discontinued due to cardiac reasoning. Patient on chronic steroid usage 10 mg daily. On admission patient's glucose was 676. - VBG: pH 7.308, pCO2 50.6, Bicarb 25.3, pO2 25 - A1C 9.3 - TSH 1.428 - Moderate Sliding Scale + Lantus  - Will monitor amount of sliding scale needed today and adjust Lantus dose appropriately  - Lantus held today by surgery - 256 this morning - Diabetes education  # AV block/pacemaker/hypertension/CHF: patient with pacemaker ([employer]. Jude CRT-P device), since age 34 for congenital AV block, chronic systolic heart failure. NYHA class II sx. Last echocardiogram August 2015: Normal LV size with mild LVH. Ejection fraction 25-30% with diffuse hypokinesis.  - Catheterization planned prior to admission for 1/8 to assess PAH and LVEDP d/t severe restriction on pulmonary function tests - Cardiology consulted. Appreciate recommendations.  -  Moved catheterization to today--normal hemodynamics with low filling pressures, lung infiltrates are not cardiac in nature  - Consider pulmonology workup with possible lung biopsy  - Cardiomyopathy suspected to be due to RV pacing  - Repeat echo as outpatient. If EF depressed  consider outpatient Myoview - Continue home meds: Zebeta 2.5 mg daily, lasix 20 mg QD, Cozaar 25 mg BID, Aldactone 12.5 mg QD.  - BNP 16.4 - Monitor daily weights, strict I/O  - Weight today 219, improved from 224 at admission  # Interstitial lung disease: Dyspnea class IIIB. On 3-4L of Oxygen at home.  - CXR stable from priorwith continued basilar predominately patchy and interstitial opacity - Continue prednisone 10 mg daily.  - Continue inhalers: albuterol, Symbicort, Dymista, Flonase, claritin  FEN/GI: Heart Healthy Carb Modified Prophylaxis: Heparin subcutaneous  Disposition: Admitted to [facility] Medicine Teaching Service  Subjective:  No acute complaints overnight. States catheterization went well. Feels ready to go home. No further complaints today.  Objective: Temp:  [98 F (36.7 C)-98.6 F (37 C)] 98.6 F (37 C) (01/07 3235) Pulse Rate:  [78-91] 91 (01/07 0613) Resp:  [18-20] 20 (01/07 0613) BP: (95-105)/(42-57) 100/57 mmHg (01/07 0613) SpO2:  [95 %-100 %] 96 % (01/07 0613) Weight:  [219 lb 12.8 oz (99.7 kg)] 219 lb 12.8 oz (99.7 kg) (01/07 5732) Physical Exam: General: 34yo female resting comfortably in no apparent distress Cardiovascular: S1 and S2 noted. No murmurs noted. Regular rate and rhythm. Respiratory: Clear to auscultation bilaterally. No wheezes/rales/rhonchi. No increased work of breathing. Abdomen: Bowel sounds noted. Soft and nondistended. No tenderness to palpation. Extremities: No edema noted.  Laboratory:  Recent Labs Lab 10/24/14 0513 10/24/14 0919  WBC 6.0 6.8  HGB 13.1 13.2  HCT 39.8 39.8  PLT 232 227    Recent Labs Lab 10/24/14 0919 10/24/14 1210 10/24/14 1323  NA 137 138 144  K 3.9 4.6 3.9  CL 104 106 108  CO2 19 19 26   BUN 10 10 9   CREATININE  1.07 0.97 0.94  CALCIUM 9.3 9.5 9.8  GLUCOSE 504* 270* 171*   Lipid Panel     Component Value Date/Time   CHOL 200 10/24/2014 0930   TRIG 163* 10/24/2014 0930   HDL 31*  10/24/2014 0930   CHOLHDL 6.5 10/24/2014 0930   VLDL 33 10/24/2014 0930   LDLCALC 136* 10/24/2014 0930   LDLDIRECT 103* 03/03/2012 1524  - A1C 9.3 - TSH 1.428 - VBG: pH 7.308, pCO2 50.6, Bicarb 25.3, pO2 25 - BNP 16.4  Imaging/Diagnostic Tests: No results found. [address] [place], [place] 10/26/2014, 8:20 AM PGY-1, [facility] Family Medicine FPTS Intern pager: [phone], text pages welcome

## 2014-10-26 NOTE — CV Procedure (Signed)
Cardiac Cath Procedure Note:  Indication: LV dysfunction with persistent lung infiltrates  Procedures performed:  1) Right heart catheterization  Description of procedure:   The risks and indication of the procedure were explained. Consent was signed and placed on the chart. An appropriate timeout was taken prior to the procedure. The right groin was prepped and draped in the routine sterile fashion and anesthetized with 1% local lidocaine.   A 7 FR venous sheath was placed in the right femoral vein using a modified Seldinger technique. A standard Swan-Ganz catheter was used for the procedure.   Complications: None apparent.  Findings:  RA = 2 RV = 27/1/5 PA =  29/7 (18) PCW = 4 Fick cardiac output/index = 4.5/2.2 PVR = 3.0 WU FA sat = 98% PA sat = 64%, 61%  Assessment:  1. Normal hemodynamics with low filling pressures  Plan/Discussion:  Suspect lung infiltrates are not cardiac in nature. Consider further pulmonary work-up with possible lung biopsy. Coronary angiography not performed as cardiomyopathy felt to be non-ischemic (related to RV pacing). Will repeat echo as outpatient to assess for improvement with CRT. If EF still depressed can consider outpatient Myoview.  Arvilla Meres MD 8:12 AM

## 2014-10-26 NOTE — Progress Notes (Addendum)
Inpatient Diabetes Program Recommendations  AACE/ADA: New Consensus Statement on Inpatient Glycemic Control (2013)  Target Ranges:  Prepandial:   less than 140 mg/dL      Peak postprandial:   less than 180 mg/dL (1-2 hours)      Critically ill patients:  140 - 180 mg/dL  Results for Makayla Vasquez, Makayla Vasquez (MRN [id number]) as of 10/26/2014 10:13  Ref. Range 10/25/2014 08:23 10/25/2014 11:48 10/25/2014 16:47 10/25/2014 21:17 10/26/2014 06:11  Glucose-Capillary Latest Range: 70-99 mg/dL 219 (H) 376 (H) 352 (H) 286 (H) 256 (H)   Add Lantus 15 units daily. Late Entry:  This coordinator met with patient yesterday 1/6 at approximately 2 p.m. To discuss new-onset DM.  Basic diabetes meal planning was discussed and a Diabetes Meal Planning Guide given to patient.  Discussed monitoring CBGs at home and taking insulin.  Pt is receptive to both but is concerned that she could not afford a glucose meter previously when RX given.  Pt states she has Medicaid and Medicare so a glucose meter, strips and lancets should be covered.  She will need a new RX at discharge.   Pt became tearful at the end of our visit from feeling "overwhelmed".  This coordinator encouraged patient to express her feelings and cry.  Offered support, encouragement and a follow-up visit 1/7 to answer questions/concerns.  Will see around noon today.   ADDENDUM: this coordinator met with patient to teach insulin pen self-administration.  Teach back method used and pt was able to demonstrate appropriate insulin pen use.  Insulin pen starter-kit ordered for patient to take home. Thank you  Raoul Pitch BSN, RN,CDE Inpatient Diabetes Coordinator [phone] (team pager)

## 2014-10-27 ENCOUNTER — Telehealth: Payer: Self-pay | Admitting: Family Medicine

## 2014-10-27 ENCOUNTER — Encounter (HOSPITAL_COMMUNITY): Admission: RE | Payer: Self-pay | Source: Ambulatory Visit

## 2014-10-27 ENCOUNTER — Ambulatory Visit (HOSPITAL_COMMUNITY): Admission: RE | Admit: 2014-10-27 | Payer: Medicare Other | Source: Ambulatory Visit | Admitting: Internal Medicine

## 2014-10-27 ENCOUNTER — Encounter (HOSPITAL_COMMUNITY): Payer: Self-pay | Admitting: Emergency Medicine

## 2014-10-27 DIAGNOSIS — Z8659 Personal history of other mental and behavioral disorders: Secondary | ICD-10-CM | POA: Diagnosis not present

## 2014-10-27 DIAGNOSIS — Q246 Congenital heart block: Secondary | ICD-10-CM | POA: Diagnosis not present

## 2014-10-27 DIAGNOSIS — E1165 Type 2 diabetes mellitus with hyperglycemia: Secondary | ICD-10-CM | POA: Insufficient documentation

## 2014-10-27 DIAGNOSIS — Z87891 Personal history of nicotine dependence: Secondary | ICD-10-CM | POA: Insufficient documentation

## 2014-10-27 DIAGNOSIS — Z8669 Personal history of other diseases of the nervous system and sense organs: Secondary | ICD-10-CM | POA: Diagnosis not present

## 2014-10-27 DIAGNOSIS — E669 Obesity, unspecified: Secondary | ICD-10-CM | POA: Diagnosis not present

## 2014-10-27 DIAGNOSIS — Z7952 Long term (current) use of systemic steroids: Secondary | ICD-10-CM | POA: Insufficient documentation

## 2014-10-27 DIAGNOSIS — Z8719 Personal history of other diseases of the digestive system: Secondary | ICD-10-CM | POA: Insufficient documentation

## 2014-10-27 DIAGNOSIS — Z8701 Personal history of pneumonia (recurrent): Secondary | ICD-10-CM | POA: Diagnosis not present

## 2014-10-27 DIAGNOSIS — Z794 Long term (current) use of insulin: Secondary | ICD-10-CM | POA: Insufficient documentation

## 2014-10-27 DIAGNOSIS — I509 Heart failure, unspecified: Secondary | ICD-10-CM | POA: Insufficient documentation

## 2014-10-27 DIAGNOSIS — I10 Essential (primary) hypertension: Secondary | ICD-10-CM | POA: Insufficient documentation

## 2014-10-27 DIAGNOSIS — Z79899 Other long term (current) drug therapy: Secondary | ICD-10-CM | POA: Diagnosis not present

## 2014-10-27 DIAGNOSIS — Z950 Presence of cardiac pacemaker: Secondary | ICD-10-CM | POA: Diagnosis not present

## 2014-10-27 DIAGNOSIS — J45909 Unspecified asthma, uncomplicated: Secondary | ICD-10-CM | POA: Diagnosis not present

## 2014-10-27 DIAGNOSIS — R739 Hyperglycemia, unspecified: Secondary | ICD-10-CM | POA: Diagnosis present

## 2014-10-27 LAB — POCT I-STAT, CHEM 8
BUN: 12 mg/dL (ref 6–23)
CHLORIDE: 108 meq/L (ref 96–112)
CREATININE: 0.8 mg/dL (ref 0.50–1.10)
Calcium, Ion: 1.14 mmol/L (ref 1.12–1.23)
Glucose, Bld: 271 mg/dL — ABNORMAL HIGH (ref 70–99)
HCT: 38 % (ref 36.0–46.0)
Hemoglobin: 12.9 g/dL (ref 12.0–15.0)
Potassium: 3.2 mmol/L — ABNORMAL LOW (ref 3.5–5.1)
Sodium: 134 mmol/L — ABNORMAL LOW (ref 135–145)
TCO2: 22 mmol/L (ref 0–100)

## 2014-10-27 LAB — POCT I-STAT 3, VENOUS BLOOD GAS (G3P V)
Acid-base deficit: 3 mmol/L — ABNORMAL HIGH (ref 0.0–2.0)
Acid-base deficit: 4 mmol/L — ABNORMAL HIGH (ref 0.0–2.0)
BICARBONATE: 21.8 meq/L (ref 20.0–24.0)
Bicarbonate: 22.8 mEq/L (ref 20.0–24.0)
O2 SAT: 61 %
O2 SAT: 64 %
PCO2 VEN: 40.6 mmHg — AB (ref 45.0–50.0)
PCO2 VEN: 41.6 mmHg — AB (ref 45.0–50.0)
PH VEN: 7.338 — AB (ref 7.250–7.300)
PH VEN: 7.347 — AB (ref 7.250–7.300)
PO2 VEN: 34 mmHg (ref 30.0–45.0)
TCO2: 23 mmol/L (ref 0–100)
TCO2: 24 mmol/L (ref 0–100)
pO2, Ven: 35 mmHg (ref 30.0–45.0)

## 2014-10-27 SURGERY — RIGHT HEART CATH
Anesthesia: LOCAL

## 2014-10-27 MED FILL — Heparin Sodium (Porcine) 2 Unit/ML in Sodium Chloride 0.9%: INTRAMUSCULAR | Qty: 1000 | Status: AC

## 2014-10-27 MED FILL — Midazolam HCl Inj 2 MG/2ML (Base Equivalent): INTRAMUSCULAR | Qty: 2 | Status: AC

## 2014-10-27 MED FILL — Fentanyl Citrate Inj 0.05 MG/ML: INTRAMUSCULAR | Qty: 2 | Status: AC

## 2014-10-27 MED FILL — Lidocaine HCl Local Preservative Free (PF) Inj 1%: INTRAMUSCULAR | Qty: 30 | Status: AC

## 2014-10-27 NOTE — Telephone Encounter (Signed)
Emergency phone call  Patient calls stating she was just discharged from the hospital and is newly diagnosed with DM. She was placed on lantus and sliding scale in the hospital and was discharged on lantus 10 u qam, which she took this morning. She notes she checked her blood sugar this evening as she states "I feel weird". Her cbg was 368. She does not have any rapid acting insulin to take at home. She notes she has been urinating more that usual. I advised that we would not be able to treat this over the phone especially in light of her having no rapid acting insulin at home. I advised the patient to go to the ED for evaluation and treatment. She voiced understanding of this.   Marikay Alar, MD

## 2014-10-27 NOTE — Discharge Summary (Signed)
[facility] Discharge Summary  Patient name: Makayla Vasquez Medical record number: [id number] Date of birth: 1981-04-15 Age: 34 y.o. Gender: female Date of Admission: 10/24/2014  Date of Discharge: 10/26/14 Admitting Physician: Lind Covert, MD  Primary Care Provider: Nobie Putnam, DO Consultants: Cardiology  Indication for Hospitalization: Dizziness, New Onset Diabetes  Discharge Diagnoses/Problem List:  Diabetes Dizziness CHF  Interstitial Lung Disease  Disposition: Discharge Home  Discharge Condition: Stable  Brief Hospital Course:  Makayla Vasquez is a 34yo female who presented to the [facility] with dizziness on 10/24/14. Blood glucose noted to be 676. No anion gap noted. No electrolyte abnormalities noted. A1C 9.3. TSH 1.428. Suspected to be exacerbated by prednisone use. Initiated on glucose stabilizer, but quickly transitioned to insulin sliding scale and lantus. Dizziness improved with fluids and blood sugar management.  Due to significant cardiac history and scheduled catheterization on 10/27/14, Cardiology was consulted. BNP 16.4. Catheterization moved to 1/7 and showed normal hemodynamics with low filling pressures. Suspect lung infiltrates shown on xray are not cardiac in nature. Recommend pulmonary workup and possible lung biopsy. Repeat echo as outpatient. If EF still depressed consider outpatient Myoview..  Issues for Follow Up:  - Consider use of Metformin for diabetes management. Kidneys working sufficiently to handle despite history of CHF. Not initiated in hospital due to catheterization. - Follow up blood sugars. Discharged with 10units Lantus. Instructed to increase dose by 1Unit on days when sugar >150. Follow up Lantus dose and adjust as indicated. - Suspected if/when prednisone discontinued, insulin requirement should be closely monitored. - Consider vestibular rehab if dizziness continues. Epleys helped dizziness in  hospital. -Recommend pulmonary workup and possible lung biopsy. Repeat echo as outpatient. If EF still depressed consider outpatient Myoview. - Follow up gait stability.  Significant Procedures: Catheterization  Significant Labs and Imaging:   Recent Labs Lab 10/24/14 0513 10/24/14 0919 10/26/14 0817 10/26/14 0956  WBC 6.0 6.8  --  6.3  HGB 13.1 13.2 12.9 12.6  HCT 39.8 39.8 38.0 37.2  PLT 232 227  --  210    Recent Labs Lab 10/24/14 0513 10/24/14 0919 10/24/14 1210 10/24/14 1323 10/26/14 0817 10/26/14 0956  NA 134* 137 138 144 134*  --   K 4.2 3.9 4.6 3.9 3.2*  --   CL 99 104 106 108 108  --   CO2 25 19 19 26   --   --   GLUCOSE 676* 504* 270* 171* 271*  --   BUN 12 10 10 9 12   --   CREATININE 1.18* 1.07 0.97 0.94 0.80 0.87  CALCIUM 9.6 9.3 9.5 9.8  --   --   MG  --  2.1  --   --   --   --   PHOS  --  2.8  --   --   --   --    Lipid Panel     Component Value Date/Time   CHOL 200 10/24/2014 0930   TRIG 163* 10/24/2014 0930   HDL 31* 10/24/2014 0930   CHOLHDL 6.5 10/24/2014 0930   VLDL 33 10/24/2014 0930   LDLCALC 136* 10/24/2014 0930   LDLDIRECT 103* 03/03/2012 1524  - A1C 9.3 - TSH 1.428 - VBG: pH 7.308, pCO2 50.6, Bicarb 25.3, pO2 25 - BNP 16.4  Results/Tests Pending at Time of Discharge: None  Discharge Medications:    Medication List    STOP taking these medications        DYMISTA 137-50 MCG/ACT Susp  Generic drug:  Azelastine-Fluticasone      TAKE these medications        albuterol 108 (90 BASE) MCG/ACT inhaler  Commonly known as:  PROVENTIL HFA;VENTOLIN HFA  Inhale 2 puffs into the lungs every 4 (four) hours as needed for wheezing or shortness of breath.     bisoprolol 5 MG tablet  Commonly known as:  ZEBETA  Take 0.5 tablets (2.5 mg total) by mouth daily.     budesonide-formoterol 80-4.5 MCG/ACT inhaler  Commonly known as:  SYMBICORT  Inhale 2 puffs into the lungs 2 (two) times daily as needed (for shortness of breath).      cetirizine 10 MG tablet  Commonly known as:  ZYRTEC  Take 1 tablet (10 mg total) by mouth daily.     fluticasone 50 MCG/ACT nasal spray  Commonly known as:  FLONASE  Place 2 sprays into both nostrils daily as needed for allergies or rhinitis.     furosemide 20 MG tablet  Commonly known as:  LASIX  Take 1 tablet (20 mg total) by mouth daily. For shortness of breath and/or leg swelling.     glucose blood test strip  Commonly known as:  ONE TOUCH ULTRA TEST  Check blood sugar daily before breakfast     Insulin Glargine 100 UNIT/ML Solostar Pen  Commonly known as:  LANTUS  Inject 10 Units into the skin daily at 10 pm.     losartan 25 MG tablet  Commonly known as:  COZAAR  Take 1 tablet (25 mg total) by mouth 2 (two) times daily.     NEXPLANON 68 MG Impl implant  Generic drug:  etonogestrel  1 each by Subdermal route once.     ONE TOUCH LANCETS Misc  Check blood sugar daily before breakfast     ONE TOUCH ULTRA 2 W/DEVICE Kit  Check blood sugar daily before breakfast     potassium chloride SA 20 MEQ tablet  Commonly known as:  K-DUR,KLOR-CON  Take 20 mEq by mouth daily.     predniSONE 10 MG tablet  Commonly known as:  DELTASONE  Take 10 mg by mouth daily with breakfast.     spironolactone 25 MG tablet  Commonly known as:  ALDACTONE  Take 0.5 tablets (12.5 mg total) by mouth daily.        Discharge Instructions: Please refer to Patient Instructions section of EMR for full details.  Patient was counseled important signs and symptoms that should prompt return to medical care, changes in medications, dietary instructions, activity restrictions, and follow up appointments.   Follow-Up Appointments:     Follow-up Information    Follow up with Nobie Putnam, DO On 11/01/2014.   Specialty:  Osteopathic Medicine   Why:  @8 :30am for Hospital Follow Up    Contact information:   [place] [address] [phone]       Follow up with [facility].   Why:  Outpatient Vestibular Therapy to start following hospital discharge.    Contact information:   [address], [place], [place]  [address] [phone] phone [phone] fax      [place], [place] 10/27/2014, 9:46 PM PGY-1, [place]

## 2014-10-27 NOTE — ED Notes (Signed)
Pt. reports elevated blood sugar at home this evening 368 , denies pain or discomfort / respirations unlabored , recent hospital admission on 10/24/14 for hyperglycemia .

## 2014-10-28 ENCOUNTER — Emergency Department (HOSPITAL_COMMUNITY)
Admission: EM | Admit: 2014-10-28 | Discharge: 2014-10-28 | Disposition: A | Discharge status: Home / Self Care | Payer: Medicare Other | Attending: Emergency Medicine | Admitting: Emergency Medicine

## 2014-10-28 DIAGNOSIS — E1165 Type 2 diabetes mellitus with hyperglycemia: Secondary | ICD-10-CM | POA: Diagnosis not present

## 2014-10-28 DIAGNOSIS — R739 Hyperglycemia, unspecified: Secondary | ICD-10-CM

## 2014-10-28 LAB — COMPREHENSIVE METABOLIC PANEL
ALK PHOS: 66 U/L (ref 39–117)
ALT: 12 U/L (ref 0–35)
AST: 16 U/L (ref 0–37)
Albumin: 3.4 g/dL — ABNORMAL LOW (ref 3.5–5.2)
Anion gap: 10 (ref 5–15)
BUN: 10 mg/dL (ref 6–23)
CO2: 21 mmol/L (ref 19–32)
Calcium: 9 mg/dL (ref 8.4–10.5)
Chloride: 101 mEq/L (ref 96–112)
Creatinine, Ser: 0.97 mg/dL (ref 0.50–1.10)
GFR calc Af Amer: 87 mL/min — ABNORMAL LOW (ref >90)
GFR calc non Af Amer: 75 mL/min — ABNORMAL LOW (ref >90)
GLUCOSE: 325 mg/dL — AB (ref 70–99)
Potassium: 3.9 mmol/L (ref 3.5–5.1)
SODIUM: 132 mmol/L — AB (ref 135–145)
Total Bilirubin: 0.8 mg/dL (ref 0.3–1.2)
Total Protein: 7.5 g/dL (ref 6.0–8.3)

## 2014-10-28 LAB — CBC WITH DIFFERENTIAL/PLATELET
BASOS ABS: 0 10*3/uL (ref 0.0–0.1)
BASOS PCT: 0 % (ref 0–1)
Eosinophils Absolute: 0 10*3/uL (ref 0.0–0.7)
Eosinophils Relative: 0 % (ref 0–5)
HEMATOCRIT: 36.8 % (ref 36.0–46.0)
Hemoglobin: 12.3 g/dL (ref 12.0–15.0)
LYMPHS ABS: 2 10*3/uL (ref 0.7–4.0)
LYMPHS PCT: 29 % (ref 12–46)
MCH: 28.1 pg (ref 26.0–34.0)
MCHC: 33.4 g/dL (ref 30.0–36.0)
MCV: 84.2 fL (ref 78.0–100.0)
Monocytes Absolute: 0.5 10*3/uL (ref 0.1–1.0)
Monocytes Relative: 7 % (ref 3–12)
Neutro Abs: 4.4 10*3/uL (ref 1.7–7.7)
Neutrophils Relative %: 64 % (ref 43–77)
PLATELETS: 238 10*3/uL (ref 150–400)
RBC: 4.37 MIL/uL (ref 3.87–5.11)
RDW: 13.4 % (ref 11.5–15.5)
WBC: 6.9 10*3/uL (ref 4.0–10.5)

## 2014-10-28 LAB — CBG MONITORING, ED
GLUCOSE-CAPILLARY: 278 mg/dL — AB (ref 70–99)
Glucose-Capillary: 235 mg/dL — ABNORMAL HIGH (ref 70–99)

## 2014-10-28 MED ORDER — SODIUM CHLORIDE 0.9 % IV BOLUS (SEPSIS): 1000.0000 mL | Freq: Once | INTRAVENOUS | Status: DC

## 2014-10-28 MED ORDER — INSULIN ASPART 100 UNIT/ML ~~LOC~~ SOLN
5.0000 [IU] | Freq: Once | SUBCUTANEOUS | Status: AC
  Administered 2014-10-28: 5 [IU] via SUBCUTANEOUS

## 2014-10-28 MED ORDER — INSULIN ASPART 100 UNIT/ML ~~LOC~~ SOLN
5.0000 [IU] | Freq: Once | SUBCUTANEOUS | Status: DC
  Filled 2014-10-28: qty 1

## 2014-10-28 NOTE — Discharge Instructions (Signed)
Check your blood sugar every morning before breakfast. If it is greater than 150 then increase your lantus by 2 Units. For example, if your sugar is 140 tomorrow give yourself 12 Units of lantus. If it is 200 give yourself 14 units of lantus.   See your doctor on Monday.   Return to ER if you have vomiting, dehydration, fevers.

## 2014-10-28 NOTE — ED Provider Notes (Signed)
CSN: [id number]     Arrival date & time 10/27/14  2315 History  This chart was scribed for Makayla Arthurs, MD by Makayla Vasquez, ED Scribe. This patient was seen in room A03C/A03C and the patient's care was started at 12:21 AM.   Chief Complaint  Patient presents with  . Hyperglycemia    The history is provided by the patient. No language interpreter was used.     HPI Comments: Makayla Vasquez is a 34 y.o. female, with history of DM, who presents to the Emergency Department complaining of hyperglycemia with onset PTA. Patient was admitted a week ago and was diagnosed with new onset diabetes. She was discharged 2 days ago and started on lantus. She reports her blood glucose was down in the 100s after being discharged. She states she was placed on 10 units of Lantus, and reports she takes this in the morning and notes compliance with this medication. She states she took Lantis this morning and once tonight PTA. She reports her sugar was 194 this morning and 368 PTA. She states that she feels "funny", but denies any pain or discomfort. She is currently on 4L of O2 at home and is currently taking Lasix (46m). She denies any increase in SOB or trouble breathing. She had heart catheterization during the admission that was normal.    Past Medical History  Diagnosis Date  . Cardiac pacemaker     a. Since age [age]in 89 b. Upgrade to BiV in 2013.  . Congenital complete AV block   . Obesity   . GERD (gastroesophageal reflux disease)   . Asymptomatic LV dysfunction     a. Echo in Dec 2011 with EF 35 to 40%. Felt to be due to paced rhythm. b. EF 25-30% in 07/2014.  . Seizures     as a child- from high fever  . Anxiety   . Bipolar affective disorder   . Depression     bipolar  . Carpal tunnel syndrome of right wrist   . Asthma     seasonal allergies   . Arthritis     rheumatoid arthritis- mild, no rheumatology care   . Hypertension   . Pneumonitis     a. a/w hypoxia - inflammatory - large  workup 07/2014.  .Marland KitchenSinus tachycardia   . Diabetes mellitus without complication   . Presence of permanent cardiac pacemaker    Past Surgical History  Procedure Laterality Date  . Throat surgery  1994    s/p laser treatment  . Cesarean section    . Insert / replace / remove pacemaker      2001  . Cholecystectomy    . Iud removal  11/03/2011    Procedure: INTRAUTERINE DEVICE (IUD) REMOVAL;  Surgeon: Myra C. DHulan Fray MD;  [facility];  Service: Gynecology;  Laterality: N/A;  . Cyst excision  12/10/2012    THYROID  . Thyroglossal duct cyst N/A 12/10/2012    Procedure: REVISION OF THYROGLOSSAL DUCT CYST EXCISION;  Surgeon: JIzora Gala MD;  Location[facility]  Service: ENT;  Laterality: N/A;  Revision of Thyroglossal Duct Cyst Excision  . Nasal fracture surgery      /w plate   . Carpal tunnel with cubital tunnel Right 07/26/2013    Procedure: RIGHT LIMITED OPEN CARPAL TUNNEL RELEASE ,  RIGHT CUBITAL TUNNEL RELEASE, INSITU VERSES ULNAR NERVE DECOMPRESSION AND ANTERIOR TRANSPOSITION;  Surgeon: WRoseanne Kaufman MD;  Location[facility]  Service: Orthopedics;  Laterality: Right;  .  Video bronchoscopy Bilateral 06/19/2014    Procedure: VIDEO BRONCHOSCOPY WITHOUT FLUORO;  Surgeon: Brand Males, MD;  Location: [facility];  Service: Cardiopulmonary;  Laterality: Bilateral;  . Bi-ventricular pacemaker upgrade N/A 03/08/2012    Procedure: BI-VENTRICULAR PACEMAKER UPGRADE;  Surgeon: [name], MD;  Location: [facility] CATH LAB;  Service: Cardiovascular;  Laterality: N/A;  . Atrial tach ablation N/A 08/14/2014    Procedure: ATRIAL TACH ABLATION;  Surgeon: [name], MD;  Location: [facility] CATH LAB;  Service: Cardiovascular;  Laterality: N/A;  . Right heart catheterization N/A 10/26/2014    Procedure: RIGHT HEART CATH;  Surgeon: [name], MD;  Location: [facility] CATH LAB;  Service: Cardiovascular;  Laterality: N/A;   Family History  Problem Relation Age of Onset  . Heart disease Mother     CHF (no  details)  . Hypertension Mother   . Heart disease Father     Murmur  . Heart disease Sister 27     No details.  History of a pacemaker  . Lupus Mother    History  Substance Use Topics  . Smoking status: Former Smoker -- 0.25 packs/day for .5 years    Types: Cigarettes    Quit date: 07/25/1996  . Smokeless tobacco: Never Used  . Alcohol Use: No   OB History    Gravida Para Term Preterm AB TAB SAB Ectopic Multiple Living   _0 Review of Systems  Constitutional: Positive for fatigue.  Neurological: Positive for weakness.  All other systems reviewed and are negative.     Allergies  Sertraline hcl and Tape  Home Medications   Prior to Admission medications   Medication Sig Start Date End Date Taking? Authorizing Provider  albuterol (PROVENTIL HFA;VENTOLIN HFA) 108 (90 BASE) MCG/ACT inhaler Inhale 2 puffs into the lungs every 4 (four) hours as needed for wheezing or shortness of breath. 06/16/14  Yes Nobie Putnam, DO  bisoprolol (ZEBETA) 5 MG tablet Take 0.5 tablets (2.5 mg total) by mouth daily. 09/18/14  Yes [name], MD  Blood Glucose Monitoring Suppl (ONE TOUCH ULTRA 2) W/DEVICE KIT Check blood sugar daily before breakfast 10/25/14  Yes Leone Brand, MD  budesonide-formoterol [facility]) 80-4.5 MCG/ACT inhaler Inhale 2 puffs into the lungs 2 (two) times daily as needed (for shortness of breath).  04/06/14  Yes Deneise Lever, MD  cetirizine (ZYRTEC) 10 MG tablet Take 1 tablet (10 mg total) by mouth daily. 05/22/14  Yes Nobie Putnam, DO  etonogestrel (NEXPLANON) 68 MG IMPL implant 1 each by Subdermal route once.   Yes Historical Provider, MD  fluticasone (FLONASE) 50 MCG/ACT nasal spray Place 2 sprays into both nostrils daily as needed for allergies or rhinitis.   Yes Historical Provider, MD  furosemide (LASIX) 20 MG tablet Take 1 tablet (20 mg total) by mouth daily. For shortness of breath and/or leg swelling. 09/22/14  Yes Cordelia Poche,  MD  glucose blood (ONE TOUCH ULTRA TEST) test strip Check blood sugar daily before breakfast 10/25/14  Yes Leone Brand, MD  Insulin Glargine (LANTUS) 100 UNIT/ML Solostar Pen Inject 10 Units into the skin daily at 10 pm. 10/25/14  Yes [place], DO  losartan (COZAAR) 25 MG tablet Take 1 tablet (25 mg total) by mouth 2 (two) times daily. 09/28/14  Yes [name], MD  ONE [facility] LANCETS MISC Check blood sugar daily before breakfast 10/25/14  Yes Leone Brand, MD  potassium chloride SA (K-DUR,KLOR-CON) 20 MEQ tablet Take 20 mEq by mouth daily.   Yes Historical Provider, MD  predniSONE (DELTASONE) 10 MG tablet Take 10 mg by mouth daily with breakfast.    Yes Historical Provider, MD  spironolactone (ALDACTONE) 25 MG tablet Take 0.5 tablets (12.5 mg total) by mouth daily. 09/28/14  Yes [name], MD   Triage Vitals: BP 110/95 mmHg  Pulse 85  Temp(Src) 98.3 F (36.8 C) (Oral)  Resp 20  Ht 5' 10" (1.778 m)  Wt 219 lb (99.338 kg)  BMI 31.42 kg/m2  SpO2 91%  Physical Exam  Constitutional: She is oriented to person, place, and time. She appears well-developed and well-nourished. No distress.  HENT:  Head: Normocephalic and atraumatic.  Eyes: Conjunctivae and EOM are normal.  Neck: Neck supple. No tracheal deviation present.  Cardiovascular: Normal rate, regular rhythm and normal heart sounds.   Pulmonary/Chest: Effort normal and breath sounds normal. No respiratory distress.  Abdominal: Soft. There is no tenderness.  Musculoskeletal: Normal range of motion. She exhibits no edema.  No peripheral edema.  Neurological: She is alert and oriented to person, place, and time.  Skin: Skin is warm and dry.  Psychiatric: She has a normal mood and affect. Her behavior is normal.  Nursing note and vitals reviewed.   ED Course  Procedures (including critical care time)  DIAGNOSTIC STUDIES: Oxygen Saturation is 91% on room air, adequate by my interpretation.    COORDINATION OF  CARE: At 0027 Discussed treatment plan with patient. Patient agrees.   Labs Review Labs Reviewed  COMPREHENSIVE METABOLIC PANEL - Abnormal; Notable for the following:    Sodium 132 (*)    Glucose, Bld 325 (*)    Albumin 3.4 (*)    GFR calc non Af Amer 75 (*)    GFR calc Af Amer 87 (*)    All other components within normal limits  CBG MONITORING, ED - Abnormal; Notable for the following:    Glucose-Capillary 278 (*)    All other components within normal limits  CBG MONITORING, ED - Abnormal; Notable for the following:    Glucose-Capillary 235 (*)    All other components within normal limits  CBC WITH DIFFERENTIAL    Imaging Review No results found.   EKG Interpretation None      MDM   Final diagnoses:  None    AIGNER HORSEMAN is a 34 y.o. female here with hyperglycemia. No signs of DKA. Labs showed glucose 325 with nl AG. After given 5 U novolog SubQ, dropped to 235. I talked with family practice, who recommend increase lantus by 2U daily if CBG > 150 in AM. They will see her in 2 days.    I personally performed the services described in this documentation, which was scribed in my presence. The recorded information has been reviewed and is accurate.   Makayla Arthurs, MD 10/28/14 [phone]

## 2014-10-28 NOTE — Progress Notes (Signed)
Makayla Vasquez is a 34 y.o. female recently discharged from [facility] with new DM diagnosis.  Sent to ED earlier tonight after calling emergency after-hours line and remaining blood glucose greater than 350 and "feeling weird," to r/o DKA.  Spoke to ED provider about this patient. Labs wnl, no AG, no N/V.  No concern for DKA currently.  Agreed upon plan to give sliding-scale dose of NovoLog now and discharge home. Patient to be given instructions about increasing Lantus dose according to fasting blood glucoses. Patient to increase Lantus dose by 2 units every morning the blood glucose is greater than 150. For example, if blood glucose is greater than 150 later this morning, patient should increase Lantus dose from 10 to 12 units daily.    Patient with pre-existing hospital follow-up appointment in family medicine clinic on Wednesday 1/13 at 8:30 am.  Will discuss DM and adjust medications as indicated at that time.  Shirlee Latch, MD, MPH PGY-1,  [facility] 10/28/2014 1:14 AM

## 2014-10-29 ENCOUNTER — Encounter (HOSPITAL_COMMUNITY): Payer: Self-pay | Admitting: Emergency Medicine

## 2014-10-29 ENCOUNTER — Observation Stay (HOSPITAL_COMMUNITY)
Admission: EM | Admit: 2014-10-29 | Discharge: 2014-10-31 | Disposition: A | Discharge status: Home / Self Care | Payer: Medicare Other | Attending: Family Medicine | Admitting: Family Medicine

## 2014-10-29 DIAGNOSIS — E1169 Type 2 diabetes mellitus with other specified complication: Secondary | ICD-10-CM

## 2014-10-29 DIAGNOSIS — Z7952 Long term (current) use of systemic steroids: Secondary | ICD-10-CM | POA: Insufficient documentation

## 2014-10-29 DIAGNOSIS — F419 Anxiety disorder, unspecified: Secondary | ICD-10-CM | POA: Insufficient documentation

## 2014-10-29 DIAGNOSIS — J45909 Unspecified asthma, uncomplicated: Secondary | ICD-10-CM | POA: Insufficient documentation

## 2014-10-29 DIAGNOSIS — J849 Interstitial pulmonary disease, unspecified: Secondary | ICD-10-CM | POA: Diagnosis not present

## 2014-10-29 DIAGNOSIS — Z87891 Personal history of nicotine dependence: Secondary | ICD-10-CM | POA: Insufficient documentation

## 2014-10-29 DIAGNOSIS — I5022 Chronic systolic (congestive) heart failure: Secondary | ICD-10-CM | POA: Insufficient documentation

## 2014-10-29 DIAGNOSIS — J9610 Chronic respiratory failure, unspecified whether with hypoxia or hypercapnia: Secondary | ICD-10-CM | POA: Insufficient documentation

## 2014-10-29 DIAGNOSIS — R51 Headache: Secondary | ICD-10-CM

## 2014-10-29 DIAGNOSIS — Z950 Presence of cardiac pacemaker: Secondary | ICD-10-CM | POA: Insufficient documentation

## 2014-10-29 DIAGNOSIS — I10 Essential (primary) hypertension: Secondary | ICD-10-CM | POA: Diagnosis not present

## 2014-10-29 DIAGNOSIS — K130 Diseases of lips: Principal | ICD-10-CM | POA: Insufficient documentation

## 2014-10-29 DIAGNOSIS — E669 Obesity, unspecified: Secondary | ICD-10-CM | POA: Insufficient documentation

## 2014-10-29 DIAGNOSIS — Z6835 Body mass index (BMI) 35.0-35.9, adult: Secondary | ICD-10-CM | POA: Insufficient documentation

## 2014-10-29 DIAGNOSIS — F319 Bipolar disorder, unspecified: Secondary | ICD-10-CM | POA: Diagnosis not present

## 2014-10-29 DIAGNOSIS — Q246 Congenital heart block: Secondary | ICD-10-CM | POA: Diagnosis not present

## 2014-10-29 DIAGNOSIS — R519 Headache, unspecified: Secondary | ICD-10-CM

## 2014-10-29 DIAGNOSIS — E119 Type 2 diabetes mellitus without complications: Secondary | ICD-10-CM | POA: Insufficient documentation

## 2014-10-29 DIAGNOSIS — T783XXA Angioneurotic edema, initial encounter: Secondary | ICD-10-CM | POA: Diagnosis not present

## 2014-10-29 DIAGNOSIS — K219 Gastro-esophageal reflux disease without esophagitis: Secondary | ICD-10-CM | POA: Diagnosis not present

## 2014-10-29 DIAGNOSIS — I5023 Acute on chronic systolic (congestive) heart failure: Secondary | ICD-10-CM | POA: Diagnosis present

## 2014-10-29 LAB — BASIC METABOLIC PANEL
Anion gap: 9 (ref 5–15)
BUN: 6 mg/dL (ref 6–23)
CHLORIDE: 104 meq/L (ref 96–112)
CO2: 21 mmol/L (ref 19–32)
Calcium: 9 mg/dL (ref 8.4–10.5)
Creatinine, Ser: 0.84 mg/dL (ref 0.50–1.10)
GFR calc Af Amer: >90 mL/min (ref >90)
GFR, EST NON AFRICAN AMERICAN: 90 mL/min — AB (ref >90)
Glucose, Bld: 219 mg/dL — ABNORMAL HIGH (ref 70–99)
Potassium: 4.2 mmol/L (ref 3.5–5.1)
Sodium: 134 mmol/L — ABNORMAL LOW (ref 135–145)

## 2014-10-29 LAB — CBC
HCT: 36 % (ref 36.0–46.0)
HEMOGLOBIN: 11.9 g/dL — AB (ref 12.0–15.0)
MCH: 28.1 pg (ref 26.0–34.0)
MCHC: 33.1 g/dL (ref 30.0–36.0)
MCV: 85.1 fL (ref 78.0–100.0)
PLATELETS: 218 10*3/uL (ref 150–400)
RBC: 4.23 MIL/uL (ref 3.87–5.11)
RDW: 13.7 % (ref 11.5–15.5)
WBC: 7.1 10*3/uL (ref 4.0–10.5)

## 2014-10-29 LAB — GLUCOSE, CAPILLARY: Glucose-Capillary: 424 mg/dL — ABNORMAL HIGH (ref 70–99)

## 2014-10-29 MED ORDER — FUROSEMIDE 20 MG PO TABS
20.0000 mg | ORAL_TABLET | Freq: Every day | ORAL | Status: DC
  Administered 2014-10-30 – 2014-10-31 (×2): 20 mg via ORAL
  Filled 2014-10-29 (×2): qty 1

## 2014-10-29 MED ORDER — PREDNISONE 20 MG PO TABS
60.0000 mg | ORAL_TABLET | Freq: Once | ORAL | Status: AC
  Administered 2014-10-29: 60 mg via ORAL
  Filled 2014-10-29: qty 3

## 2014-10-29 MED ORDER — PREDNISONE 50 MG PO TABS
60.0000 mg | ORAL_TABLET | Freq: Every day | ORAL | Status: DC
  Administered 2014-10-30: 60 mg via ORAL
  Filled 2014-10-29 (×3): qty 1

## 2014-10-29 MED ORDER — INSULIN ASPART 100 UNIT/ML ~~LOC~~ SOLN
0.0000 [IU] | Freq: Three times a day (TID) | SUBCUTANEOUS | Status: DC
  Administered 2014-10-30: 11 [IU] via SUBCUTANEOUS
  Administered 2014-10-30: 15 [IU] via SUBCUTANEOUS
  Administered 2014-10-30: 11 [IU] via SUBCUTANEOUS
  Administered 2014-10-31 (×2): 8 [IU] via SUBCUTANEOUS

## 2014-10-29 MED ORDER — TETRACAINE HCL 0.5 % OP SOLN
1.0000 [drp] | Freq: Once | OPHTHALMIC | Status: AC
  Administered 2014-10-29: 1 [drp] via OPHTHALMIC
  Filled 2014-10-29: qty 2

## 2014-10-29 MED ORDER — SODIUM CHLORIDE 0.9 % IV SOLN: 250.0000 mL | INTRAVENOUS | Status: DC | PRN

## 2014-10-29 MED ORDER — DIPHENHYDRAMINE HCL 25 MG PO CAPS
50.0000 mg | ORAL_CAPSULE | Freq: Once | ORAL | Status: AC
  Administered 2014-10-29: 50 mg via ORAL
  Filled 2014-10-29: qty 2

## 2014-10-29 MED ORDER — BUDESONIDE-FORMOTEROL FUMARATE 80-4.5 MCG/ACT IN AERO
2.0000 | INHALATION_SPRAY | Freq: Two times a day (BID) | RESPIRATORY_TRACT | Status: DC | PRN
  Filled 2014-10-29: qty 6.9

## 2014-10-29 MED ORDER — CETYLPYRIDINIUM CHLORIDE 0.05 % MT LIQD
7.0000 mL | Freq: Two times a day (BID) | OROMUCOSAL | Status: DC
  Administered 2014-10-29 – 2014-10-31 (×3): 7 mL via OROMUCOSAL

## 2014-10-29 MED ORDER — FLUORESCEIN SODIUM 1 MG OP STRP
1.0000 | ORAL_STRIP | Freq: Once | OPHTHALMIC | Status: AC
  Administered 2014-10-29: 1 via OPHTHALMIC
  Filled 2014-10-29: qty 1

## 2014-10-29 MED ORDER — HEPARIN SODIUM (PORCINE) 5000 UNIT/ML IJ SOLN
5000.0000 [IU] | Freq: Three times a day (TID) | INTRAMUSCULAR | Status: DC
  Administered 2014-10-29 – 2014-10-31 (×5): 5000 [IU] via SUBCUTANEOUS
  Filled 2014-10-29 (×9): qty 1

## 2014-10-29 MED ORDER — LORATADINE 10 MG PO TABS
10.0000 mg | ORAL_TABLET | Freq: Every day | ORAL | Status: DC
  Administered 2014-10-30 – 2014-10-31 (×2): 10 mg via ORAL
  Filled 2014-10-29 (×2): qty 1

## 2014-10-29 MED ORDER — ENSURE COMPLETE PO LIQD: 237.0000 mL | Freq: Two times a day (BID) | ORAL | Status: DC

## 2014-10-29 MED ORDER — ACETAMINOPHEN 650 MG RE SUPP: 650.0000 mg | Freq: Four times a day (QID) | RECTAL | Status: DC | PRN

## 2014-10-29 MED ORDER — POTASSIUM CHLORIDE CRYS ER 20 MEQ PO TBCR
20.0000 meq | EXTENDED_RELEASE_TABLET | Freq: Every day | ORAL | Status: DC
  Administered 2014-10-30 – 2014-10-31 (×2): 20 meq via ORAL
  Filled 2014-10-29 (×2): qty 1

## 2014-10-29 MED ORDER — INSULIN ASPART 100 UNIT/ML ~~LOC~~ SOLN
0.0000 [IU] | Freq: Every day | SUBCUTANEOUS | Status: DC
  Administered 2014-10-29 – 2014-10-30 (×2): 5 [IU] via SUBCUTANEOUS

## 2014-10-29 MED ORDER — ACETAMINOPHEN 325 MG PO TABS
650.0000 mg | ORAL_TABLET | Freq: Four times a day (QID) | ORAL | Status: DC | PRN
  Administered 2014-10-31: 650 mg via ORAL
  Filled 2014-10-29: qty 2

## 2014-10-29 MED ORDER — ALBUTEROL SULFATE HFA 108 (90 BASE) MCG/ACT IN AERS: 2.0000 | INHALATION_SPRAY | RESPIRATORY_TRACT | Status: DC | PRN

## 2014-10-29 MED ORDER — FLUTICASONE PROPIONATE 50 MCG/ACT NA SUSP
2.0000 | Freq: Every day | NASAL | Status: DC | PRN
  Filled 2014-10-29: qty 16

## 2014-10-29 MED ORDER — SODIUM CHLORIDE 0.9 % IJ SOLN
3.0000 mL | Freq: Two times a day (BID) | INTRAMUSCULAR | Status: DC
  Administered 2014-10-29 – 2014-10-31 (×3): 3 mL via INTRAVENOUS

## 2014-10-29 MED ORDER — INSULIN ASPART 100 UNIT/ML ~~LOC~~ SOLN
5.0000 [IU] | Freq: Once | SUBCUTANEOUS | Status: AC
  Administered 2014-10-29: 5 [IU] via SUBCUTANEOUS

## 2014-10-29 MED ORDER — BISOPROLOL FUMARATE 5 MG PO TABS
2.5000 mg | ORAL_TABLET | Freq: Every day | ORAL | Status: DC
  Administered 2014-10-29 – 2014-10-31 (×3): 2.5 mg via ORAL
  Filled 2014-10-29 (×3): qty 0.5

## 2014-10-29 MED ORDER — DIPHENHYDRAMINE HCL 25 MG PO CAPS
25.0000 mg | ORAL_CAPSULE | Freq: Four times a day (QID) | ORAL | Status: DC
  Administered 2014-10-29 – 2014-10-31 (×6): 25 mg via ORAL
  Filled 2014-10-29 (×12): qty 1

## 2014-10-29 MED ORDER — ACETAMINOPHEN 325 MG PO TABS
650.0000 mg | ORAL_TABLET | Freq: Once | ORAL | Status: AC
  Administered 2014-10-29: 650 mg via ORAL
  Filled 2014-10-29 (×2): qty 2

## 2014-10-29 MED ORDER — SPIRONOLACTONE 12.5 MG HALF TABLET
12.5000 mg | ORAL_TABLET | Freq: Every day | ORAL | Status: DC
  Administered 2014-10-30 – 2014-10-31 (×2): 12.5 mg via ORAL
  Filled 2014-10-29 (×3): qty 1

## 2014-10-29 MED ORDER — FAMOTIDINE 40 MG PO TABS
40.0000 mg | ORAL_TABLET | Freq: Every day | ORAL | Status: DC
  Administered 2014-10-29 – 2014-10-30 (×2): 40 mg via ORAL
  Filled 2014-10-29 (×3): qty 1

## 2014-10-29 MED ORDER — SODIUM CHLORIDE 0.9 % IJ SOLN: 3.0000 mL | INTRAMUSCULAR | Status: DC | PRN

## 2014-10-29 MED ORDER — FAMOTIDINE 20 MG PO TABS
20.0000 mg | ORAL_TABLET | Freq: Once | ORAL | Status: AC
  Administered 2014-10-29: 20 mg via ORAL
  Filled 2014-10-29: qty 1

## 2014-10-29 NOTE — ED Notes (Signed)
Pt states three days ago her lips were swollen when she woke up. Denies any new foods or medications. Denies SOB or difffcultly swallowing. No hives or rash noted.

## 2014-10-29 NOTE — ED Provider Notes (Signed)
Care transferred from [facility], [place].  Makayla Vasquez is a 34 y.o. female presents with three-day sensation of tingling and burning in her lips and then 1 day history of upper lip swelling. Patient stated this occurred after cardiac catheterization in the hospital on 10/26/14.  She reports 2 days of burning pain in the area prior to swelling.  Pt also with swelling also around left eye without pain, visual changes, slurred speech, weakness. She is without hives, urticaria, petechiae, purpura or other rash.  She denies any new foods, medications, lotions, detergents, perfume, make-up or environments. Pt without a hx of angioedema and denies swelling of the mouth or throat.  No wheezing, abd pain or vomiting.  No pruritis.     Physical Exam  BP 96/47 mmHg  Pulse 80  Temp(Src) 97.9 F (36.6 C) (Oral)  Resp 18  Ht 5\' 6"  (1.676 m)  Wt 219 lb (99.338 kg)  BMI 35.36 kg/m2  SpO2 100%  Physical Exam   Face to face Exam:   General: Awake  HEENT: Atraumatic. Swelling to the left eye, bridge of the nose and upper lip.  No erythema or induration Resp: Normal effort  Cardiac: Mild tachycardia Abd: Nondistended  Neuro:No focal weakness  Lymph: No adenopathy Skin: No rash    ED Course  Procedures  MDM  1. Angioedema, initial encounter     Plan: Pt to be observed 4 hours.  If swelling improves she may be d/c home with allergic reaction meds and clindamycin for preseptal cellulitis.    3:42 PM  Pt evaluated.  Concern for angioedema.  Pt reports swelling is worsening in spite of prednisone, benadryl and pepcid.  No swelling of the tongue, uvula and no stridor.  Will observe for a short time more, but patient will likely need admission for monitoring.    6:34 PM No improvement in swelling or symptoms.  Labs reassuring.  Pt reports she feels no better after treatments.    6:54 PM Discussed with family medicine who will admit overnight for observation.  BP 96/47 mmHg  Pulse 80   Temp(Src) 97.9 F (36.6 C) (Oral)  Resp 18  Ht 5\' 6"  (1.676 m)  Wt 219 lb (99.338 kg)  BMI 35.36 kg/m2  SpO2 100%   , PA-C 10/29/14 1854  [name]. 12/28/14, MD 10/30/14 Rubin Payor

## 2014-10-29 NOTE — H&P (Signed)
[facility] Admission History and Physical Service Pager: [phone]  Patient name: Makayla Vasquez Medical record number: [id number] Date of birth: [birth year] Age: 35 y.o. Gender: female  Primary Care Provider: Nobie Putnam, DO Consultants: None Code Status: Full  Chief Complaint: Swelling of face  Assessment and Plan: Makayla Vasquez is a 34 y.o. female presenting with swelling of the face. PMH is significant for complete AV block and cardiac pacemaker (since age 44 ), chronic systolic heart failure, chronic resp failure, sinus tachycardia, Interstitial lung disease, chronic steroid use, hypertension and diabetes.   Angioedema of face: New-onset swelling of face. This has never occurred before in this patient. She has no new exposures and only new medication is Lantus. Current diagnosis is idiopathic angeodemia. - Admit to telemetry under Dr. Erin Hearing - Will increase prednisone to 38m daily (patient was taking 160mdaily for her lung disease) - Scheduled Benadryl and famotidine -continue home claritin -CBC and BMP in a.m. -Will observe overnight -If worsens patient will need to be evaluated for airway compromise -will hold losartan and lantus as possible culprits  Diabetes: Last Hbg A1c was 9.3 in January. Patient was just discharged on Lantus last hospitalization and was titrating up. Last does of Lantus was 14U. -will hold lantus for now given possible cause for angioedema -moderate SSI started -may need to adjust SSI due to patient being on prednisone -CBG monitoring  AV block/pacemaker/hypertension/CHF: patient with pacemaker ([employer] CRT-P device), since age [age]or congenital AV block, chronic systolic heart failure. NYHA class II sx. She is followed in the CHF clinic. Last echocardiogram August 2015: Normal LV size with mild LVH. Ejection fraction 25-30% with diffuse hypokinesis. Cardiac Cath performed on 1/0/3/5465ithout complication.  Patient with soft BPs - Will continue Lasix, Spironolactone, K-dur, Zebeta - Hold Losartan with soft BPs and angioedema - Monitor daily weights, strict I/O  Interstitial lung disease: Dyspnea class IIIB. On 3-4L of Oxygen at home.  - Continue prednisone.  - Continue inhalers: albuterol, Symbicort, Dymista, Flonase, claritin  FEN/GI: carb modified diet; saline lock Prophylaxis: Hep sq  Disposition: Admit to tele for further observation.  History of Present Illness: Makayla FELLOWSs a [age].o. female presenting with facial swelling. Past medical history is significant for complete AV block, cardiac pacemaker since age [age]systolic heart failure, chronic respiratory failure, interstitial lung disease, hypertension, and prediabetes. Patient states that about 3 days ago she felt tingling around her lips. Then starting today she noticed eyelid, nose, and lip swelling. Patient denies any swelling of her tongue. She has no trouble breathing and is able to swallow. She denies swelling anywhere else. She says her face feels heavy and is hurting. She has never experienced this before. Patient does endorse some blurry vision the last 3 days. Patient denies any new soaps or detergents or foods. The only new home medication the patient started was Lantus. Patient states that she has been titrating up her Lantus since hospital discharge. She gave herself 14 units today. Of note, patient had a recent heart cath on January 7. She is unaware what could have precipitated this event.  In the ED, patient was noted to have allergic reaction. Patient was given prednisone, Benadryl and Pepcid with no improvement so far  Review Of Systems: Per HPI. Otherwise 12 point review of systems was performed and was unremarkable.  Patient Active Problem List   Diagnosis Date Noted  . CHF (congestive heart failure)   . Diabetes mellitus  type 2 in obese   . Hyperglycemia 10/24/2014  . Dehydration   . Sinus tachycardia   .  Chronic respiratory failure with hypoxia 08/07/2014  . Atrial tachycardia 07/26/2014  . Insertion of implantable subdermal contraceptive 07/12/2014  . Acute respiratory failure with hypoxia 06/19/2014  . ILD (interstitial lung disease) 06/19/2014  . Pulmonary infiltrates 06/17/2014  . Dyspnea 06/16/2014  . Hypoxemia 06/16/2014  . Congestive dilated cardiomyopathy 05/31/2014  . Nonallergic rhinitis 05/22/2014  . Vaginal itching 05/22/2014  . Pneumonia 05/09/2014  . Prediabetes 05/09/2014  . Shortness of breath 05/08/2014  . Sterilization consult 04/20/2014  . Breast tenderness 01/18/2014  . Carpal tunnel syndrome 09/10/2012  . Constipation 03/03/2012  . Chronic systolic heart failure 04/18/1600  . Chest pain, unspecified 12/29/2011  . Post-nasal drip 10/24/2011  . Contraception 01/29/2011  . Heart palpitations   . PPM-Medtronic   . Congenital complete AV block   . Bipolar affective disorder   . Obesity   . GERD (gastroesophageal reflux disease)   . Hypertension 01/05/2011  . THYROGLOSSAL DUCT CYST 07/18/2010  . Impaired glucose tolerance 05/07/2010  . UNSPECIFIED CARDIAC DYSRHYTHMIA 05/07/2010  . RH FACTOR, NEGATIVE 04/08/2010  . Mild intermittent asthma without complication 09/32/3557  . 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 HEART BLOCK 12/17/2006  . IRRITABLE BOWEL SYNDROME 12/17/2006   Past Medical History: Past Medical History  Diagnosis Date  . Cardiac pacemaker     a. Since age 68 in 2. b. Upgrade to BiV in 2013.  . Congenital complete AV block   . Obesity   . GERD (gastroesophageal reflux disease)   . Asymptomatic LV dysfunction     a. Echo in Dec 2011 with EF 35 to 40%. Felt to be due to paced rhythm. b. EF 25-30% in 07/2014.  . Seizures     as a child- from high fever  . Anxiety   . Bipolar affective disorder   . Depression     bipolar  . Carpal tunnel syndrome of right wrist   . Asthma     seasonal allergies   . Arthritis     rheumatoid arthritis- mild,  no rheumatology care   . Hypertension   . Pneumonitis     a. a/w hypoxia - inflammatory - large workup 07/2014.  [name] Sinus tachycardia   . Diabetes mellitus without complication   . Presence of permanent cardiac pacemaker    Past Surgical History: Past Surgical History  Procedure Laterality Date  . Throat surgery  1994    s/p laser treatment  . Cesarean section    . Insert / replace / remove pacemaker      2001  . Cholecystectomy    . Iud removal  11/03/2011    Procedure: INTRAUTERINE DEVICE (IUD) REMOVAL;  Surgeon: [name], MD;  Location: [facility];  Service: Gynecology;  Laterality: N/A;  . Cyst excision  12/10/2012    THYROID  . Thyroglossal duct cyst N/A 12/10/2012    Procedure: REVISION OF THYROGLOSSAL DUCT CYST EXCISION;  Surgeon: [name], MD;  Location: [facility];  Service: ENT;  Laterality: N/A;  Revision of Thyroglossal Duct Cyst Excision  . Nasal fracture surgery      /w plate   . Carpal tunnel with cubital tunnel Right 07/26/2013    Procedure: RIGHT LIMITED OPEN CARPAL TUNNEL RELEASE ,  RIGHT CUBITAL TUNNEL RELEASE, INSITU VERSES ULNAR NERVE DECOMPRESSION AND ANTERIOR TRANSPOSITION;  Surgeon: [name], MD;  Location: [facility];  Service: Orthopedics;  Laterality: Right;  .  Video bronchoscopy Bilateral 06/19/2014    Procedure: VIDEO BRONCHOSCOPY WITHOUT FLUORO;  Surgeon: Brand Males, MD;  Location: [facility];  Service: Cardiopulmonary;  Laterality: Bilateral;  . Bi-ventricular pacemaker upgrade N/A 03/08/2012    Procedure: BI-VENTRICULAR PACEMAKER UPGRADE;  Surgeon: [name], MD;  Location: [facility] CATH LAB;  Service: Cardiovascular;  Laterality: N/A;  . Atrial tach ablation N/A 08/14/2014    Procedure: ATRIAL TACH ABLATION;  Surgeon: [name], MD;  Location: [facility] CATH LAB;  Service: Cardiovascular;  Laterality: N/A;  . Right heart catheterization N/A 10/26/2014    Procedure: RIGHT HEART CATH;  Surgeon: [name], MD;  Location: [facility] CATH LAB;  Service:  Cardiovascular;  Laterality: N/A;   Social History: History  Substance Use Topics  . Smoking status: Former Smoker -- 0.25 packs/day for .5 years    Types: Cigarettes    Quit date: 07/25/1996  . Smokeless tobacco: Never Used  . Alcohol Use: No   Please also refer to relevant sections of EMR.  Family History: Family History  Problem Relation Age of Onset  . Heart disease Mother     CHF (no details)  . Hypertension Mother   . Heart disease Father     Murmur  . Heart disease Sister 58     No details.  History of a pacemaker  . Lupus Mother    Allergies and Medications: Allergies  Allergen Reactions  . Sertraline Hcl Hives  . Tape Other (See Comments)    Burns skin   No current facility-administered medications on file prior to encounter.   Current Outpatient Prescriptions on File Prior to Encounter  Medication Sig Dispense Refill  . bisoprolol (ZEBETA) 5 MG tablet Take 0.5 tablets (2.5 mg total) by mouth daily. 15 tablet 3  . budesonide-formoterol (SYMBICORT) 80-4.5 MCG/ACT inhaler Inhale 2 puffs into the lungs 2 (two) times daily as needed (for shortness of breath).     . cetirizine (ZYRTEC) 10 MG tablet Take 1 tablet (10 mg total) by mouth daily. 30 tablet 3  . etonogestrel (NEXPLANON) 68 MG IMPL implant 1 each by Subdermal route once.    . fluticasone (FLONASE) 50 MCG/ACT nasal spray Place 2 sprays into both nostrils daily as needed for allergies or rhinitis.    . furosemide (LASIX) 20 MG tablet Take 1 tablet (20 mg total) by mouth daily. For shortness of breath and/or leg swelling. 30 tablet 0  . Insulin Glargine (LANTUS) 100 UNIT/ML Solostar Pen Inject 10 Units into the skin daily at 10 pm. 15 mL 0  . losartan (COZAAR) 25 MG tablet Take 1 tablet (25 mg total) by mouth 2 (two) times daily. 60 tablet 3  . potassium chloride SA (K-DUR,KLOR-CON) 20 MEQ tablet Take 20 mEq by mouth daily.    . predniSONE (DELTASONE) 10 MG tablet Take 10 mg by mouth daily with breakfast.      . spironolactone (ALDACTONE) 25 MG tablet Take 0.5 tablets (12.5 mg total) by mouth daily. 15 tablet 3  . albuterol (PROVENTIL HFA;VENTOLIN HFA) 108 (90 BASE) MCG/ACT inhaler Inhale 2 puffs into the lungs every 4 (four) hours as needed for wheezing or shortness of breath. 1 each 1  . Blood Glucose Monitoring Suppl (ONE TOUCH ULTRA 2) W/DEVICE KIT Check blood sugar daily before breakfast 1 each 0  . glucose blood (ONE TOUCH ULTRA TEST) test strip Check blood sugar daily before breakfast 100 each 0  . ONE TOUCH LANCETS MISC Check blood sugar daily before breakfast 200 each 0  Objective: BP 96/47 mmHg  Pulse 80  Temp(Src) 97.9 F (36.6 C) (Oral)  Resp 18  Ht _0  (1.676 m)  Wt 219 lb (99.338 kg)  BMI 35.36 kg/m2  SpO2 100% Exam: General: Pleasant, African-American female, no acute distress, well-developed, well-nourished, morbidly obese. Cooperative. HEENT: Atraumatic, normocephalic. No ejections or icterus. MMM. L. Swollen eyelid. Bilateral buccal swelling. Philtrum and upper lip swollen and tender to palpation. EOMI. No erythema or excessive warmth appreciated. Neck: Supple, midline trachea, no lymphadenopathy. Cardiovascular: Regular rate and rhythm, no murmurs appreciated. Respiratory: Clear to auscultation bilaterally, normal breath sounds. No wheezes rhonchi or rales. Abdomen: Soft, obese, nontender nondistended, no masses, bowel sounds present Extremities: No erythema, no edema. +2/4 PT. Skin: Intact, WWP Neuro: Alert, oriented 3, Perla, EOMI. Normal speech. Moving all 4 extremities without difficulty. No focal deficits.   Labs and Imaging: Results for orders placed or performed during the hospital encounter of 10/29/14 (from the past 24 hour(s))  CBC     Status: Abnormal   Collection Time: 10/29/14  5:12 PM  Result Value Ref Range   WBC 7.1 4.0 - 10.5 K/uL   RBC 4.23 3.87 - 5.11 MIL/uL   Hemoglobin 11.9 (L) 12.0 - 15.0 g/dL   HCT 36.0 36.0 - 46.0 %   MCV 85.1 78.0 -  100.0 fL   MCH 28.1 26.0 - 34.0 pg   MCHC 33.1 30.0 - 36.0 g/dL   RDW 13.7 11.5 - 15.5 %   Platelets 218 150 - 400 K/uL  Basic metabolic panel     Status: Abnormal   Collection Time: 10/29/14  5:12 PM  Result Value Ref Range   Sodium 134 (L) 135 - 145 mmol/L   Potassium 4.2 3.5 - 5.1 mmol/L   Chloride 104 96 - 112 mEq/L   CO2 21 19 - 32 mmol/L   Glucose, Bld 219 (H) 70 - 99 mg/dL   BUN 6 6 - 23 mg/dL   Creatinine, Ser 0.84 0.50 - 1.10 mg/dL   Calcium 9.0 8.4 - 10.5 mg/dL   GFR calc non Af Amer 90 (L) >90 mL/min   GFR calc Af Amer >90 >90 mL/min   Anion gap 9 5 - [id number] DO 10/29/2014, 6:46 PM PGY-1, [place] [facility] pager: [phone], text pages welcome  Beverlyn Roux, MD, MPH [facility] PGY-2 10/29/2014 9:13 PM

## 2014-10-29 NOTE — Progress Notes (Signed)
Pt has hs blood sugar 424 mg/dl. Pt is given 5 units of insulin novolog per sliding scale. On-call provider notified. MD also  made aware of 2-3 episodes of nonsustained tachycardia (>140/min). Pt is asymptomatic. HR is in 90's now. Order received. Will cont to monitor.

## 2014-10-29 NOTE — Progress Notes (Signed)
Report received from [place], [place] in ED.

## 2014-10-29 NOTE — ED Notes (Signed)
Family at the bedside, pt. s swelling decreasing .

## 2014-10-29 NOTE — ED Provider Notes (Signed)
CSN: [id number]     Arrival date & time 10/29/14  1252 History   First MD Initiated Contact with Patient 10/29/14 1349     Chief Complaint  Patient presents with  . Allergic Reaction     (Consider location/radiation/quality/duration/timing/severity/associated sxs/prior Treatment) HPI  KADEISHA BETSCH is a 34 y.o. female with PMH of complete AV block with pacemaker since age aches 22, diastolic heart failure, hypertension, diabetes, bipolar, anxiety, depression, obesity presenting with three-day sensation of tingling and burning in her lips and then 1 day history of lip swelling. Patient stated this occurred after cardiac catheterization in the hospital. Swelling also around left eye and she denies visual changes, slurred speech, weakness. She does endorse gradual onset headache. She denies fevers, chills, nausea, vomiting, abdominal pain, chest pain, shortness of breath, difficulty breathing. No highs or cutaneous rash. She denies any new foods or medications. Pt doesn't take lisinopril.   Past Medical History  Diagnosis Date  . Cardiac pacemaker     a. Since age 70 in 30. b. Upgrade to BiV in 2013.  . Congenital complete AV block   . Obesity   . GERD (gastroesophageal reflux disease)   . Asymptomatic LV dysfunction     a. Echo in Dec 2011 with EF 35 to 40%. Felt to be due to paced rhythm. b. EF 25-30% in 07/2014.  . Seizures     as a child- from high fever  . Anxiety   . Bipolar affective disorder   . Depression     bipolar  . Carpal tunnel syndrome of right wrist   . Asthma     seasonal allergies   . Arthritis     rheumatoid arthritis- mild, no rheumatology care   . Hypertension   . Pneumonitis     a. a/w hypoxia - inflammatory - large workup 07/2014.  [name] Sinus tachycardia   . Diabetes mellitus without complication   . Presence of permanent cardiac pacemaker   . CHF (congestive heart failure)    Past Surgical History  Procedure Laterality Date  . Throat surgery  1994     s/p laser treatment  . Cesarean section    . Insert / replace / remove pacemaker      2001  . Cholecystectomy    . Iud removal  11/03/2011    Procedure: INTRAUTERINE DEVICE (IUD) REMOVAL;  Surgeon: [name], MD;  Location: [facility];  Service: Gynecology;  Laterality: N/A;  . Cyst excision  12/10/2012    THYROID  . Thyroglossal duct cyst N/A 12/10/2012    Procedure: REVISION OF THYROGLOSSAL DUCT CYST EXCISION;  Surgeon: [name], MD;  Location: [place];  Service: ENT;  Laterality: N/A;  Revision of Thyroglossal Duct Cyst Excision  . Nasal fracture surgery      /w plate   . Carpal tunnel with cubital tunnel Right 07/26/2013    Procedure: RIGHT LIMITED OPEN CARPAL TUNNEL RELEASE ,  RIGHT CUBITAL TUNNEL RELEASE, INSITU VERSES ULNAR NERVE DECOMPRESSION AND ANTERIOR TRANSPOSITION;  Surgeon: [name], MD;  Location: [place];  Service: Orthopedics;  Laterality: Right;  . Video bronchoscopy Bilateral 06/19/2014    Procedure: VIDEO BRONCHOSCOPY WITHOUT FLUORO;  Surgeon: Brand Males, MD;  Location: [place];  Service: Cardiopulmonary;  Laterality: Bilateral;  . Bi-ventricular pacemaker upgrade N/A 03/08/2012    Procedure: BI-VENTRICULAR PACEMAKER UPGRADE;  Surgeon: [name], MD;  Location: [facility] CATH LAB;  Service: Cardiovascular;  Laterality: N/A;  . Atrial tach ablation N/A 08/14/2014  Procedure: ATRIAL TACH ABLATION;  Surgeon: [name], MD;  Location: [facility] CATH LAB;  Service: Cardiovascular;  Laterality: N/A;  . Right heart catheterization N/A 10/26/2014    Procedure: RIGHT HEART CATH;  Surgeon: [name], MD;  Location: [facility] CATH LAB;  Service: Cardiovascular;  Laterality: N/A;   Family History  Problem Relation Age of Onset  . Heart disease Mother     CHF (no details)  . Hypertension Mother   . Heart disease Father     Murmur  . Heart disease Sister 39     No details.  History of a pacemaker  . Lupus Mother    History  Substance Use Topics  . Smoking status:  Former Smoker -- 0.25 packs/day for .5 years    Types: Cigarettes    Quit date: 07/25/1996  . Smokeless tobacco: Never Used  . Alcohol Use: No   OB History    Gravida Para Term Preterm AB TAB SAB Ectopic Multiple Living   _0 Review of Systems Ten systems reviewed and are negative for acute change, except as noted in the HPI. -   Allergies  Sertraline hcl and Tape  Home Medications   Prior to Admission medications   Medication Sig Start Date End Date Taking? Authorizing Provider  bisoprolol (ZEBETA) 5 MG tablet Take 0.5 tablets (2.5 mg total) by mouth daily. 09/18/14  Yes [name], MD  budesonide-formoterol (SYMBICORT) 80-4.5 MCG/ACT inhaler Inhale 2 puffs into the lungs 2 (two) times daily as needed (for shortness of breath).  04/06/14  Yes Deneise Lever, MD  cetirizine (ZYRTEC) 10 MG tablet Take 1 tablet (10 mg total) by mouth daily. 05/22/14  Yes Nobie Putnam, DO  etonogestrel (NEXPLANON) 68 MG IMPL implant 1 each by Subdermal route once.   Yes Historical Provider, MD  fluticasone (FLONASE) 50 MCG/ACT nasal spray Place 2 sprays into both nostrils daily as needed for allergies or rhinitis.   Yes Historical Provider, MD  furosemide (LASIX) 20 MG tablet Take 1 tablet (20 mg total) by mouth daily. For shortness of breath and/or leg swelling. 09/22/14  Yes Cordelia Poche, MD  Insulin Glargine (LANTUS) 100 UNIT/ML Solostar Pen Inject 10 Units into the skin daily at 10 pm. 10/25/14  Yes Gadsden N Rumley, DO  losartan (COZAAR) 25 MG tablet Take 1 tablet (25 mg total) by mouth 2 (two) times daily. 09/28/14  Yes [name], MD  potassium chloride SA (K-DUR,KLOR-CON) 20 MEQ tablet Take 20 mEq by mouth daily.   Yes Historical Provider, MD  predniSONE (DELTASONE) 10 MG tablet Take 10 mg by mouth daily with breakfast.    Yes Historical Provider, MD  spironolactone (ALDACTONE) 25 MG tablet Take 0.5 tablets (12.5 mg total) by mouth daily. 09/28/14  Yes [name], MD  ACCU-CHEK SOFTCLIX LANCETS lancets Test blood sugar 3 times daily. 10/30/14   Nobie Putnam, DO  albuterol (PROVENTIL HFA;VENTOLIN HFA) 108 (90 BASE) MCG/ACT inhaler Inhale 2 puffs into the lungs every 4 (four) hours as needed for wheezing or shortness of breath. 06/16/14   Nobie Putnam, DO  Blood Glucose Monitoring Suppl (ONE TOUCH ULTRA 2) W/DEVICE KIT Check blood sugar daily before breakfast 10/25/14   Leone Brand, MD  glucose blood (ONE TOUCH ULTRA TEST) test strip Check blood sugar daily before breakfast 10/25/14   Leone Brand, MD   BP 105/55 mmHg  Pulse 80  Temp(Src)  98 F (36.7 C) (Oral)  Resp 20  Ht _0  (1.676 m)  Wt 217 lb 9.6 oz (98.703 kg)  BMI 35.14 kg/m2  SpO2 98%  LMP  Physical Exam  Constitutional: She appears well-developed and well-nourished. No distress.  HENT:  Head: Normocephalic and atraumatic.  Mouth/Throat: Oropharynx is clear and moist.  No trismus or uvula deviation patient with poor dentition No mucosal lesions. Patient with bilateral upper lip swelling. Swelling of left lower eyelid without erythema. No proptosis. No pain with EOM  Eyes: Conjunctivae and EOM are normal. Pupils are equal, round, and reactive to light. Lids are everted and swept, no foreign bodies found. Right eye exhibits no discharge. Left eye exhibits no discharge. Right conjunctiva is not injected. Right conjunctiva has no hemorrhage. Left conjunctiva is not injected. Left conjunctiva has no hemorrhage. Right eye exhibits normal extraocular motion. Left eye exhibits normal extraocular motion. Pupils are equal.  Slit lamp exam:      The right eye shows no corneal abrasion and no fluorescein uptake.       The left eye shows no corneal abrasion and no fluorescein uptake.  R IOP 19 L IOP 20  Cardiovascular: Normal rate, regular rhythm and normal heart sounds.   Pulmonary/Chest: Effort normal and breath sounds normal. No stridor. No respiratory distress. She has  no wheezes.  Abdominal: Soft. Bowel sounds are normal. She exhibits no distension. There is no tenderness.  Neurological: She is alert. She exhibits normal muscle tone. Coordination normal.  Skin: Skin is warm and dry. She is not diaphoretic.  Nursing note and vitals reviewed.   ED Course  Procedures (including critical care time) Labs Review Labs Reviewed  CBC - Abnormal; Notable for the following:    Hemoglobin 11.9 (*)    All other components within normal limits  BASIC METABOLIC PANEL - Abnormal; Notable for the following:    Sodium 134 (*)    Glucose, Bld 219 (*)    GFR calc non Af Amer 90 (*)    All other components within normal limits  BASIC METABOLIC PANEL - Abnormal; Notable for the following:    Sodium 129 (*)    Glucose, Bld 419 (*)    GFR calc non Af Amer 77 (*)    GFR calc Af Amer 90 (*)    All other components within normal limits  CBC - Abnormal; Notable for the following:    Hemoglobin 11.5 (*)    HCT 35.3 (*)    All other components within normal limits  GLUCOSE, CAPILLARY - Abnormal; Notable for the following:    Glucose-Capillary 424 (*)    All other components within normal limits  GLUCOSE, CAPILLARY - Abnormal; Notable for the following:    Glucose-Capillary 327 (*)    All other components within normal limits  GLUCOSE, CAPILLARY - Abnormal; Notable for the following:    Glucose-Capillary 220 (*)    All other components within normal limits  GLUCOSE, CAPILLARY - Abnormal; Notable for the following:    Glucose-Capillary 330 (*)    All other components within normal limits  GLUCOSE, CAPILLARY - Abnormal; Notable for the following:    Glucose-Capillary 379 (*)    All other components within normal limits  CBG MONITORING, ED    Imaging Review Ct Maxillofacial W/cm  10/30/2014   CLINICAL DATA:  Diffuse facial swelling with nasal pain for 3-4 days.  EXAM: CT MAXILLOFACIAL WITH CONTRAST  TECHNIQUE: Multidetector CT imaging of the maxillofacial  structures was performed  with intravenous contrast. Multiplanar CT image reconstructions were also generated. A small metallic BB was placed on the right temple in order to reliably differentiate right from left.  CONTRAST:  55m OMNIPAQUE IOHEXOL 300 MG/ML  SOLN  COMPARISON:  None.  FINDINGS: There is a 32 x 13 x 10 mm abscess the in the subcutaneous tissues overlying the maxilla anteriorly in the midline of the upper lip. I do not see any significant underlying dental disease. There is a small cavity involving the left central incisor but no bone abscess.  No dental disease is identified except for small mucous retention cyst or polyp in the right half of the sphenoid sinus.  The tongue base and floor of the mouth are unremarkable. The larynx is normal. Mildly prominent tonsils. There are scattered bilateral inflammatory/hyperplastic neck nodes bilaterally.  The visualized brain is unremarkable.  IMPRESSION: 32 x 13 x 10 mm abscess in the upper lip area centrally.  Inflamed/hyperplastic neck nodes bilaterally.  No significant dental disease or sinus disease.   Electronically Signed   By: MKalman JewelsM.D.   On: 10/30/2014 14:31     EKG Interpretation None      MDM   Final diagnoses:  Angioedema, initial encounter   Pt with swelling of left eye and upper lip without oral involvement. No cutaneous rash. Pt denies difficulty breathing or swallowing no shortness of breath. Pt given benadryl, pepcid and prednisone without improvement of symptoms. Concern for allergic reaction verses angioedema. Plan to observe to for 4 hours. With improvement and if hemodynamically stable plan for discharge home. Otherwise plan to admit for observation.   Pt signed out to [employer]  Discussed all results and patient verbalizes understanding and agrees with plan.   VPura Spice PA-C 10/30/14 1610  JDorie Rank MD 10/30/14 [phone]

## 2014-10-30 ENCOUNTER — Observation Stay (HOSPITAL_COMMUNITY): Payer: Medicare Other

## 2014-10-30 ENCOUNTER — Other Ambulatory Visit: Payer: Self-pay | Admitting: Family Medicine

## 2014-10-30 ENCOUNTER — Encounter (HOSPITAL_COMMUNITY): Payer: Self-pay | Admitting: Radiology

## 2014-10-30 DIAGNOSIS — E669 Obesity, unspecified: Principal | ICD-10-CM

## 2014-10-30 DIAGNOSIS — K130 Diseases of lips: Secondary | ICD-10-CM | POA: Diagnosis not present

## 2014-10-30 DIAGNOSIS — T783XXA Angioneurotic edema, initial encounter: Secondary | ICD-10-CM

## 2014-10-30 DIAGNOSIS — E1169 Type 2 diabetes mellitus with other specified complication: Secondary | ICD-10-CM

## 2014-10-30 LAB — CBC
HCT: 35.3 % — ABNORMAL LOW (ref 36.0–46.0)
HEMOGLOBIN: 11.5 g/dL — AB (ref 12.0–15.0)
MCH: 27.8 pg (ref 26.0–34.0)
MCHC: 32.6 g/dL (ref 30.0–36.0)
MCV: 85.3 fL (ref 78.0–100.0)
Platelets: 229 10*3/uL (ref 150–400)
RBC: 4.14 MIL/uL (ref 3.87–5.11)
RDW: 13.4 % (ref 11.5–15.5)
WBC: 6.8 10*3/uL (ref 4.0–10.5)

## 2014-10-30 LAB — BASIC METABOLIC PANEL
ANION GAP: 7 (ref 5–15)
BUN: 13 mg/dL (ref 6–23)
CO2: 21 mmol/L (ref 19–32)
Calcium: 8.9 mg/dL (ref 8.4–10.5)
Chloride: 101 mEq/L (ref 96–112)
Creatinine, Ser: 0.95 mg/dL (ref 0.50–1.10)
GFR calc non Af Amer: 77 mL/min — ABNORMAL LOW (ref >90)
GFR, EST AFRICAN AMERICAN: 90 mL/min — AB (ref >90)
Glucose, Bld: 419 mg/dL — ABNORMAL HIGH (ref 70–99)
POTASSIUM: 4.4 mmol/L (ref 3.5–5.1)
Sodium: 129 mmol/L — ABNORMAL LOW (ref 135–145)

## 2014-10-30 LAB — GLUCOSE, CAPILLARY
GLUCOSE-CAPILLARY: 220 mg/dL — AB (ref 70–99)
GLUCOSE-CAPILLARY: 327 mg/dL — AB (ref 70–99)
GLUCOSE-CAPILLARY: 379 mg/dL — AB (ref 70–99)
GLUCOSE-CAPILLARY: 423 mg/dL — AB (ref 70–99)
Glucose-Capillary: 330 mg/dL — ABNORMAL HIGH (ref 70–99)
Glucose-Capillary: 400 mg/dL — ABNORMAL HIGH (ref 70–99)

## 2014-10-30 MED ORDER — IOHEXOL 300 MG/ML  SOLN
80.0000 mL | Freq: Once | INTRAMUSCULAR | Status: AC | PRN
  Administered 2014-10-30: 80 mL via INTRAVENOUS

## 2014-10-30 MED ORDER — ACCU-CHEK SOFTCLIX LANCETS MISC: Status: DC

## 2014-10-30 MED ORDER — INSULIN GLARGINE 100 UNIT/ML ~~LOC~~ SOLN
10.0000 [IU] | Freq: Every day | SUBCUTANEOUS | Status: DC
  Administered 2014-10-30: 10 [IU] via SUBCUTANEOUS
  Filled 2014-10-30 (×2): qty 0.1

## 2014-10-30 NOTE — Progress Notes (Signed)
Pt. CBG was 423, paged MD and return call from Dr. Waynetta Sandy, stated to give 15 units of insulin and re-check at bedtime.  Will continue to monitor.  Forbes Cellar, RN

## 2014-10-30 NOTE — Progress Notes (Signed)
[facility] PCP Visit - Progress Note  I have seen the patient and discussed current hospitalization. Deletha states that her swelling is mostly localized to her lips, nose, and central face. Currently with persistent symptoms, but no worsening or new symptoms. Tolerating breakfast well, without any SOB or throat swelling. Understands current treatment plan and possible trigger if angioedema could be Losartan/ARB.  I agree with the current hospital plan outlined by the primary team of [facility] Teaching Service and want to thank them for their continued excellent care and efforts in caring for my patient. I will anticipate to follow-up with the patient in the clinic after discharge from hospital.  Saralyn Pilar, DO [facility], PGY-2

## 2014-10-30 NOTE — Progress Notes (Signed)
Nutrition Brief Note  Patient identified on the Malnutrition Screening Tool (MST) Report  Wt Readings from Last 15 Encounters:  10/29/14 217 lb 9.6 oz (98.703 kg)  10/27/14 219 lb (99.338 kg)  10/26/14 219 lb 12.8 oz (99.7 kg)  10/09/14 229 lb 3.2 oz (103.964 kg)  09/28/14 224 lb 4 oz (101.719 kg)  09/18/14 226 lb 8 oz (102.74 kg)  09/01/14 222 lb 9.6 oz (100.971 kg)  08/14/14 220 lb (99.791 kg)  08/07/14 221 lb 3.2 oz (100.336 kg)  07/26/14 223 lb (101.152 kg)  07/12/14 217 lb 14.4 oz (98.839 kg)  07/06/14 218 lb (98.884 kg)  06/28/14 223 lb (101.152 kg)  06/27/14 223 lb 12.8 oz (101.515 kg)  06/21/14 223 lb 5.2 oz (101.3 kg)   Makayla Vasquez is a 34 y.o. female presenting with swelling of the face. PMH is significant for complete AV block and cardiac pacemaker (since age 89 ), chronic systolic heart failure, chronic resp failure, sinus tachycardia, Interstitial lung disease, chronic steroid use, hypertension and diabetes.   Pt admitted with facial swelling. Per DM coordinator's note, pt was recently diagnosed with type 2 DM. Pt was very lethargic at time of visit. Noted 100% completion of lunch tray. Will d/c Ensure Complete.   Body mass index is 35.14 kg/(m^2). Patient meets criteria for obesity, class II based on current BMI.   Current diet order is carb modified, patient is consuming approximately 100% of meals at this time. Labs and medications reviewed.   No nutrition interventions warranted at this time. If nutrition issues arise, please consult RD.   Jalecia Leon A. Mayford Knife, RD, LDN, CDE Pager: [phone] After hours Pager: [phone]

## 2014-10-30 NOTE — Progress Notes (Signed)
NURSING PROGRESS NOTE  Makayla Vasquez [id number] Admission Data: 10/30/2014 12:57 AM Attending Provider: Carney Living, MD GTX:[id number], Lyn Hollingshead, DO Code Status: Full  Makayla Vasquez is a 34 y.o. female patient admitted from ED:  -No acute distress noted.  -No complaints of shortness of breath.  -No complaints of chest pain.   Cardiac Monitoring: Box # 16 in place. Cardiac monitor yields- V-paced  :Blood pressure 110/69, pulse 100, temperature 98.4 F (36.9 C), temperature source Oral, resp. rate 24, height 5\' 6"  (1.676 m), weight 98.703 kg (217 lb 9.6 oz), SpO2 96 %.   IV Fluids:  IV in place, occlusive dsg intact without redness, IV cath intact in lt hand #22 g  Allergies:  Sertraline hcl and Tape  Past Medical History:   has a past medical history of Cardiac pacemaker; Congenital complete AV block; Obesity; GERD (gastroesophageal reflux disease); Asymptomatic LV dysfunction; Seizures; Anxiety; Bipolar affective disorder; Depression; Carpal tunnel syndrome of right wrist; Asthma; Arthritis; Hypertension; Pneumonitis; Sinus tachycardia; Diabetes mellitus without complication; and Presence of permanent cardiac pacemaker.  Past Surgical History:   has past surgical history that includes Throat surgery (1994); Cesarean section; Insert / replace / remove pacemaker; Cholecystectomy; IUD removal (11/03/2011); Cyst excision (12/10/2012); Thyroglossal duct cyst (N/A, 12/10/2012); Nasal fracture surgery; Carpal tunnel with cubital tunnel (Right, 07/26/2013); Video bronchoscopy (Bilateral, 06/19/2014); bi-ventricular pacemaker upgrade (N/A, 03/08/2012); Atrial tach ablation (N/A, 08/14/2014); and right heart catheterization (N/A, 10/26/2014).  Social History:   reports that she quit smoking about 18 years ago. Her smoking use included Cigarettes. She has a .125 pack-year smoking history. She has never used smokeless tobacco. She reports that she does not drink alcohol or use illicit  drugs.  Skin: Intact  Patient/Family orientated to room. Information packet given to patient/family. Admission inpatient armband information verified with patient/family to include name and date of birth and placed on patient arm. Side rails up x 2, fall assessment and education completed with patient/family. Patient/family able to verbalize understanding of risk associated with falls and verbalized understanding to call for assistance before getting out of bed. Call light within reach. Patient/family able to voice and demonstrate understanding of unit orientation instructions.

## 2014-10-30 NOTE — Progress Notes (Signed)
Family Medicine Teaching Service Daily Progress Note Intern Pager: [phone]  Patient name: Makayla Vasquez Medical record number: [id number] Date of birth: 1981/07/11 Age: 34 y.o. Gender: female  Primary Care Provider: Saralyn Pilar, DO Consultants: none Code Status: full  Pt Overview and Major Events to Date:  1/10 - admit for facial swelling  Assessment and Plan: Makayla Vasquez is a 34 y.o. female presenting with swelling of the face. PMH is significant for complete AV block and cardiac pacemaker (since age 3 ), chronic systolic heart failure, chronic resp failure, sinus tachycardia, Interstitial lung disease, chronic steroid use, hypertension and diabetes.   Idiopathic Angioedema of face: Stable. She has no new exposures and only new medication is Lantus. Most likely 2/2 ARB-induced angioedema. - Continue prednisone 60mg  daily (patient was taking 10mg  daily for her lung disease) -Scheduled Benadryl and famotidine - continue home claritin -If worsens patient will need to be evaluated for airway compromise -will hold losartan and lantus as possible culprits  Diabetes: Last Hbg A1c was 9.3 earlier this month. Patient was just discharged on Lantus last hospitalization and was titrating up. Last does of Lantus was 14U. -will hold lantus for now given possible cause for angioedema -moderate SSI  - required 10 units yesterday -may need to adjust SSI due to patient being on prednisone -CBG monitoring - Can consider restarting Metformin on discharge  AV block/pacemaker/hypertension/CHF: patient with pacemaker ([employer] CRT-P device), since age 70 for congenital AV block, chronic sHF. NYHA class II sx. She is followed in the CHF clinic. Last echo 05/2014: Normal LV size with mild LVH. EF 25-30% with diffuse hypokinesis. Cardiac Cath performed on 10/26/2013 without complication. Patient with soft BPs since admission. - Will continue Lasix, Spironolactone, K-dur, Zebeta - Hold Losartan  with soft BPs and angioedema - Monitor daily weights, strict I/O  Interstitial lung disease: Stable. Dyspnea class IIIB. On 3-4L of Oxygen at home.  - Continue prednisone (increased as above) - Continue home albuterol, Symbicort, Dymista, Flonase, claritin  FEN/GI: carb modified diet; saline lock Prophylaxis: Hep sq  Disposition: Pending improvement in angioedema  Subjective:  Feeling well, denies pain. Hasn't seen her face yet, but it feels the same. Denies any itching. No SOB, tongue swelling  Objective: Temp:  [97.9 F (36.6 C)-98.4 F (36.9 C)] 97.9 F (36.6 C) (01/11 0558) Pulse Rate:  [66-100] 72 (01/11 0558) Resp:  [17-24] 22 (01/11 0558) BP: (91-125)/(27-73) 100/57 mmHg (01/11 0558) SpO2:  [93 %-100 %] 100 % (01/11 0558) Weight:  [217 lb 9.6 oz (98.703 kg)-219 lb (99.338 kg)] 217 lb 9.6 oz (98.703 kg) (01/10 2037) Physical Exam: General: Pleasant, NAD, morbidly obese. HEENT: NCAT. MMM. L Swollen eyelid. Bilateral buccal swelling. Philtrum and upper lip swollen. EOMI. No erythema or excessive warmth appreciated. Neck: Supple, midline trachea, no lymphadenopathy. Cardiovascular: RRR, no murmurs appreciated. Respiratory: CTAB, normal breath sounds. No wheezes rhonchi or rales. Abdomen: Soft, obese, NTND, no masses, bowel sounds present Extremities: No erythema, no edema.  Laboratory:  Recent Labs Lab 10/27/14 2324 10/29/14 1712 10/30/14 0511  WBC 6.9 7.1 6.8  HGB 12.3 11.9* 11.5*  HCT 36.8 36.0 35.3*  PLT 238 218 229    Recent Labs Lab 10/27/14 2324 10/29/14 1712 10/30/14 0511  NA 132* 134* 129*  K 3.9 4.2 4.4  CL 101 104 101  CO2 21 21 21   BUN 10 6 13   CREATININE 0.97 0.84 0.95  CALCIUM 9.0 9.0 8.9  PROT 7.5  --   --  BILITOT 0.8  --   --   ALKPHOS 66  --   --   ALT 12  --   --   AST 16  --   --   GLUCOSE 325* 219* 419*     Imaging/Diagnostic Tests: none  Shirlee Latch, MD 10/30/2014, 7:07 AM PGY-1, [facility] FPTS  Intern pager: [phone], text pages welcome

## 2014-10-30 NOTE — Progress Notes (Signed)
UR completed 

## 2014-10-30 NOTE — Progress Notes (Signed)
Inpatient Diabetes Program Recommendations  AACE/ADA: New Consensus Statement on Inpatient Glycemic Control (2013)  Target Ranges:  Prepandial:   less than 140 mg/dL      Peak postprandial:   less than 180 mg/dL (1-2 hours)      Critically ill patients:  140 - 180 mg/dL     Results for ESTEPHANY, PEROT (MRN [id number]) as of 10/30/2014 10:42  Ref. Range 10/29/2014 21:52 10/30/2014 07:48  Glucose-Capillary Latest Range: 70-99 mg/dL 680 (H) 321 (H)    Results for ZALAYA, ASTARITA (MRN [id number]) as of 10/30/2014 10:42  Ref. Range 10/24/2014 09:19  Hgb A1c MFr Bld Latest Range: <5.7 % 9.3 (H)     Admitted with Angioedema possibly from Losartan.  Patient just discharged from the hospital on 01/07 with new diagnosis of DM.  Was sent home with Rx for Lantus solostar insulin pen.   Home DM Meds: Lantus 10 units QHS   Current Orders: Lantus 10 units QHS (to start tonight)     Novolog Moderate SSI tid ac + HS    **Note patient currently receiving Prednisone 60 mg daily.    **Glucose levels elevated this AM.    Will follow Ambrose Finland RN, MSN, CDE Diabetes Coordinator Inpatient Diabetes Program Team Pager: [phone] (8a-10p)

## 2014-10-30 NOTE — Discharge Summary (Signed)
[facility] Discharge Summary  Patient name: Makayla Vasquez Medical record number: [id number] Date of birth: [birth year] Age: 34 y.o. Gender: female Date of Admission: 10/29/2014  Date of Discharge: 10/31/2014  Admitting Physician: Lind Covert, MD  Primary Care Provider: Nobie Putnam, DO Consultants: none  Indication for Hospitalization: facial swelling  Discharge Diagnoses/Problem List:  Upper lip abscess T2DM Congenital AV block s/p ICD placement HTN Chronic sCHF Interstitial lung disease  Disposition: home  Discharge Condition: stable  Discharge Exam: See progress note from day of discharge  Brief Hospital Course:   Makayla Vasquez is a 34 y.o. female presenting with swelling of the face. PMH is significant for complete AV block and cardiac pacemaker (since age 59 ), chronic systolic heart failure, chronic resp failure, sinus tachycardia, Interstitial lung disease, chronic steroid use, hypertension and diabetes.   Facial swelling of upper lip and L eyelid initially thought to be angioedema on admission.  Home prednisone was increased to 60m daily and patient was given scheduled benadryl and ranitidine in addition to home claritin.  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  CT maxillofacial on 1/11 showed  32x13x147mabscess of upper lip.  Patient was started on Clindamycin 30092mID and ENT was consulted.  Benadryl and ranitidine were stopped, prednisone was decreased back to home 39m42mily, and Lantus and Losartan were resumed.  ENT recommended discharge home with OP f/u in their office later on day of discharge for drainage.  Patient's CBGs were noted to be elevated during admission.  Her Lantus was held initially and started back at 10 units daily.  Care was taken not to dose too high on Lantus as CBGs likely elevated 2/2  increased prednisone and infection (both of which are decreasing).  She was managed on moderate SSI throughout admission.  At discharge, metformin was restarted (previously held by cardiology) at 500mg49m and Lantus was continued at 10 units daily.  Patient given instructions to increase Lantus by 2 units every morning that fasting CBG >150.   All other chronic medical conditions stable throughout admission and managed with home regimens.  Issues for Follow Up:  1. ENT f/u for drainage of abscess - ? D/c Clindamycin after drainage. 2. F/u DM control - restarted Metformin 500mg 73mat discharge, can uptitrate if tolerating well. Discharged on 10 units of Lantus daily with instructions to go up by 2u every day that fasting CBG is >150.  We were careful in dosing Lantus in light of infection and recent prednisone burst, but patient will likely require higher dose.  Significant Procedures: none  Significant Labs and Imaging:   Recent Labs Lab 10/29/14 1712 10/30/14 0511 10/31/14 0728  WBC 7.1 6.8 9.4  HGB 11.9* 11.5* 11.4*  HCT 36.0 35.3* 34.4*  PLT 218 229 274    Recent Labs Lab 10/26/14 0817 10/26/14 0956 10/27/14 2324 10/29/14 1712 10/30/14 0511 10/31/14 0728  NA 134*  --  132* 134* 129* 131*  K 3.2*  --  3.9 4.2 4.4 3.9  CL 108  --  101 104 101 102  CO2  --   --  _0 GLUCOSE 271*  --  325* 219* 419* 263*  BUN 12  --  _1 CREATININE 0.80 0.87 0.97 0.84 0.95 1.06  CALCIUM  --   --  9.0 9.0 8.9  8.7  ALKPHOS  --   --  66  --   --   --   AST  --   --  16  --   --   --   ALT  --   --  12  --   --   --   ALBUMIN  --   --  3.4*  --   --   --     CT maxillofacial (1/11): 32 x 13 x 10 mm abscess in the upper lip area centrally. Inflamed/hyperplastic neck nodes bilaterally. No significant dental disease or sinus disease.  Results/Tests Pending at Time of Discharge: none  Discharge Medications:    Medication List    TAKE these medications         ACCU-CHEK SOFTCLIX LANCETS lancets  Test blood sugar 3 times daily.     albuterol 108 (90 BASE) MCG/ACT inhaler  Commonly known as:  PROVENTIL HFA;VENTOLIN HFA  Inhale 2 puffs into the lungs every 4 (four) hours as needed for wheezing or shortness of breath.     bisoprolol 5 MG tablet  Commonly known as:  ZEBETA  Take 0.5 tablets (2.5 mg total) by mouth daily.     budesonide-formoterol 80-4.5 MCG/ACT inhaler  Commonly known as:  SYMBICORT  Inhale 2 puffs into the lungs 2 (two) times daily as needed (for shortness of breath).     cetirizine 10 MG tablet  Commonly known as:  ZYRTEC  Take 1 tablet (10 mg total) by mouth daily.     clindamycin 300 MG capsule  Commonly known as:  CLEOCIN  Take 1 capsule (300 mg total) by mouth every 8 (eight) hours.     fluticasone 50 MCG/ACT nasal spray  Commonly known as:  FLONASE  Place 2 sprays into both nostrils daily as needed for allergies or rhinitis.     furosemide 20 MG tablet  Commonly known as:  LASIX  Take 1 tablet (20 mg total) by mouth daily. For shortness of breath and/or leg swelling.     glucose blood test strip  Commonly known as:  ONE TOUCH ULTRA TEST  Check blood sugar daily before breakfast     Insulin Glargine 100 UNIT/ML Solostar Pen  Commonly known as:  LANTUS  Inject 10 Units into the skin daily at 10 pm.     losartan 25 MG tablet  Commonly known as:  COZAAR  Take 1 tablet (25 mg total) by mouth 2 (two) times daily.     metFORMIN 500 MG tablet  Commonly known as:  GLUCOPHAGE  Take 1 tablet (500 mg total) by mouth 2 (two) times daily with a meal.     NEXPLANON 68 MG Impl implant  Generic drug:  etonogestrel  1 each by Subdermal route once.     ONE TOUCH ULTRA 2 W/DEVICE Kit  Check blood sugar daily before breakfast     potassium chloride SA 20 MEQ tablet  Commonly known as:  K-DUR,KLOR-CON  Take 20 mEq by mouth daily.     predniSONE 10 MG tablet  Commonly known as:  DELTASONE  Take 10 mg by mouth daily  with breakfast.     spironolactone 25 MG tablet  Commonly known as:  ALDACTONE  Take 0.5 tablets (12.5 mg total) by mouth daily.        Discharge Instructions: Please refer to Patient Instructions section of EMR for full details.  Patient was counseled important signs and symptoms that should prompt return to medical care, changes  in medications, dietary instructions, activity restrictions, and follow up appointments.   Follow-Up Appointments: Follow-up Information    Follow up with Nobie Putnam, DO. Schedule an appointment as soon as possible for a visit in 1 week.   Specialty:  Osteopathic Medicine   Why:  For hospital follow-up   Contact information:   [address] [place] [address] [phone]       Follow up with Ruby Cola, MD.   Specialty:  Otolaryngology   Why:  Please go to office before 5pm today to be seen about draining the abscess   Contact information:   [address] [place] [address] [phone]       Lavon Paganini, MD 10/31/2014, 2:13 PM PGY-1, [place]

## 2014-10-30 NOTE — Plan of Care (Signed)
Problem: Phase I Progression Outcomes Goal: Pain controlled with appropriate interventions Outcome: Completed/Met Date Met:  10/30/14 Up to BR Goal: Initial discharge plan identified Outcome: Completed/Met Date Met:  10/30/14 To return home

## 2014-10-31 DIAGNOSIS — K130 Diseases of lips: Secondary | ICD-10-CM | POA: Diagnosis not present

## 2014-10-31 LAB — CBC
HCT: 34.4 % — ABNORMAL LOW (ref 36.0–46.0)
HEMOGLOBIN: 11.4 g/dL — AB (ref 12.0–15.0)
MCH: 28.5 pg (ref 26.0–34.0)
MCHC: 33.1 g/dL (ref 30.0–36.0)
MCV: 86 fL (ref 78.0–100.0)
PLATELETS: 274 10*3/uL (ref 150–400)
RBC: 4 MIL/uL (ref 3.87–5.11)
RDW: 13.1 % (ref 11.5–15.5)
WBC: 9.4 10*3/uL (ref 4.0–10.5)

## 2014-10-31 LAB — BASIC METABOLIC PANEL
ANION GAP: 6 (ref 5–15)
BUN: 15 mg/dL (ref 6–23)
CHLORIDE: 102 meq/L (ref 96–112)
CO2: 23 mmol/L (ref 19–32)
Calcium: 8.7 mg/dL (ref 8.4–10.5)
Creatinine, Ser: 1.06 mg/dL (ref 0.50–1.10)
GFR calc non Af Amer: 68 mL/min — ABNORMAL LOW (ref >90)
GFR, EST AFRICAN AMERICAN: 78 mL/min — AB (ref >90)
Glucose, Bld: 263 mg/dL — ABNORMAL HIGH (ref 70–99)
POTASSIUM: 3.9 mmol/L (ref 3.5–5.1)
SODIUM: 131 mmol/L — AB (ref 135–145)

## 2014-10-31 LAB — GLUCOSE, CAPILLARY
Glucose-Capillary: 272 mg/dL — ABNORMAL HIGH (ref 70–99)
Glucose-Capillary: 298 mg/dL — ABNORMAL HIGH (ref 70–99)

## 2014-10-31 MED ORDER — LOSARTAN POTASSIUM 25 MG PO TABS
25.0000 mg | ORAL_TABLET | Freq: Two times a day (BID) | ORAL | Status: DC
  Administered 2014-10-31: 25 mg via ORAL
  Filled 2014-10-31 (×3): qty 1

## 2014-10-31 MED ORDER — METFORMIN HCL 500 MG PO TABS: 500.0000 mg | ORAL_TABLET | Freq: Two times a day (BID) | ORAL | Status: DC

## 2014-10-31 MED ORDER — CLINDAMYCIN HCL 300 MG PO CAPS
300.0000 mg | ORAL_CAPSULE | Freq: Three times a day (TID) | ORAL | Status: DC
  Administered 2014-10-31 (×2): 300 mg via ORAL
  Filled 2014-10-31 (×4): qty 1

## 2014-10-31 MED ORDER — PREDNISONE 10 MG PO TABS
10.0000 mg | ORAL_TABLET | Freq: Every day | ORAL | Status: DC
  Administered 2014-10-31: 10 mg via ORAL
  Filled 2014-10-31 (×2): qty 1

## 2014-10-31 MED ORDER — CLINDAMYCIN HCL 300 MG PO CAPS: 300.0000 mg | ORAL_CAPSULE | Freq: Three times a day (TID) | ORAL | Status: DC

## 2014-10-31 NOTE — Progress Notes (Signed)
Inpatient Diabetes Program Recommendations  AACE/ADA: New Consensus Statement on Inpatient Glycemic Control (2013)  Target Ranges:  Prepandial:   less than 140 mg/dL      Peak postprandial:   less than 180 mg/dL (1-2 hours)      Critically ill patients:  140 - 180 mg/dL     Results for Makayla Vasquez, Makayla Vasquez (MRN [id number]) as of 10/31/2014 09:18  Ref. Range 10/30/2014 07:48 10/30/2014 12:17 10/30/2014 17:07 10/30/2014 21:51  Glucose-Capillary Latest Range: 70-99 mg/dL 062 (H) 694 (H) 854 (H) 400 (H)    Results for Makayla Vasquez, Makayla Vasquez (MRN [id number]) as of 10/31/2014 09:18  Ref. Range 10/31/2014 07:54  Glucose-Capillary Latest Range: 70-99 mg/dL 381 (H)     Current Orders: Lantus 10 units QHS      Novolog Moderate SSI tid ac + HS   **Note Prednisone reduced to 10 mg daily today (was 60 mg today)  **Having elevated fasting and postprandial glucose levels. Eating 100% of meals.    MD- Please consider the following insulin adjustments:  1. Increase Lantus to 15 units QHS 2. Add Novolog Meal Coverage- Novolog 4 units tid with meals    Will follow Ambrose Finland RN, MSN, CDE Diabetes Coordinator Inpatient Diabetes Program Team Pager: [phone] (8a-10p)

## 2014-10-31 NOTE — Discharge Instructions (Signed)
You were admitted for facial swelling.  This is caused by an abscess of your upper lip.  I prescribed Clindamycin (an antibiotic) for the infection.  Talk to the ENT doctor about whether or not to continue this.  Go to the ENT office today to have the abscess drained.  Continue to check your blood sugar every morning and go up on your Lantus dose by 2 units every morning that your blood sugar is above 150.    Abscess An abscess is an infected area that contains a collection of pus and debris.It can occur in almost any part of the body. An abscess is also known as a furuncle or boil. CAUSES  An abscess occurs when tissue gets infected. This can occur from blockage of oil or sweat glands, infection of hair follicles, or a minor injury to the skin. As the body tries to fight the infection, pus collects in the area and creates pressure under the skin. This pressure causes pain. People with weakened immune systems have difficulty fighting infections and get certain abscesses more often.  SYMPTOMS Usually an abscess develops on the skin and becomes a painful mass that is red, warm, and tender. If the abscess forms under the skin, you may feel a moveable soft area under the skin. Some abscesses break open (rupture) on their own, but most will continue to get worse without care. The infection can spread deeper into the body and eventually into the bloodstream, causing you to feel ill.  DIAGNOSIS  Your caregiver will take your medical history and perform a physical exam. A sample of fluid may also be taken from the abscess to determine what is causing your infection. TREATMENT  Your caregiver may prescribe antibiotic medicines to fight the infection. However, taking antibiotics alone usually does not cure an abscess. Your caregiver may need to make a small cut (incision) in the abscess to drain the pus. In some cases, gauze is packed into the abscess to reduce pain and to continue draining the area. HOME  CARE INSTRUCTIONS   Only take over-the-counter or prescription medicines for pain, discomfort, or fever as directed by your caregiver.  If you were prescribed antibiotics, take them as directed. Finish them even if you start to feel better.  If gauze is used, follow your caregiver's directions for changing the gauze.  To avoid spreading the infection:  Keep your draining abscess covered with a bandage.  Wash your hands well.  Do not share personal care items, towels, or whirlpools with others.  Avoid skin contact with others.  Keep your skin and clothes clean around the abscess.  Keep all follow-up appointments as directed by your caregiver. SEEK MEDICAL CARE IF:   You have increased pain, swelling, redness, fluid drainage, or bleeding.  You have muscle aches, chills, or a general ill feeling.  You have a fever. MAKE SURE YOU:   Understand these instructions.  Will watch your condition.  Will get help right away if you are not doing well or get worse. Document Released: 07/16/2005 Document Revised: 04/06/2012 Document Reviewed: 12/19/2011 [facility] Patient Information 2015 [place], [place]. This information is not intended to replace advice given to you by your health care provider. Make sure you discuss any questions you have with your health care provider.

## 2014-10-31 NOTE — Progress Notes (Signed)
Family Medicine Teaching Service Daily Progress Note Intern Pager: [phone]  Patient name: Makayla Vasquez Medical record number: [id number] Date of birth: 09-Jul-1981 Age: 34 y.o. Gender: female  Primary Care Provider: Saralyn Pilar, DO Consultants: none Code Status: full  Pt Overview and Major Events to Date:  1/10 - admit for facial swelling 1/12 - CT with abscess, start abx, ENT c/s  Assessment and Plan: Makayla Vasquez is a 34 y.o. female presenting with swelling of the face. PMH is significant for complete AV block and cardiac pacemaker (since age 31 ), chronic systolic heart failure, chronic resp failure, sinus tachycardia, Interstitial lung disease, chronic steroid use, hypertension and diabetes.   Upper lip abscess: Initially thought to be angioedema possibly 2/2 ARB, but Ct shows  32x13x14mm abscess of upper lip. - Decrease prednisone back to 10mg  daily for her lung disease - d/c Scheduled Benadryl and famotidine - continue home claritin - Consider ENT consult for possible drainage today - Clindamycin PO 300mg  q8h  Diabetes: Last Hbg A1c was 9.3 earlier this month. Patient was just discharged on Lantus last hospitalization and was titrating up. Last dose of Lantus was 14U. - Restarted Lantus 10 units qhs last night - moderate SSI  - required 42 units yesterday - may need to adjust SSI due to patient being on prednisone - CBG monitoring - 379 - 423 O/N - Can consider restarting Metformin on discharge  AV block/pacemaker/hypertension/CHF: Currently normotensive. Patient with pacemaker ([employer] CRT-P device), since age 41 for congenital AV block, chronic sHF. NYHA class II sx. She is followed in the CHF clinic. Last echo 05/2014: Normal LV size with mild LVH. EF 25-30% with diffuse hypokinesis. Cardiac Cath performed on 10/26/2013 without complication. - Will continue Lasix, Spironolactone, K-dur, Zebeta - Can likely restart Losartan if BPs can tolerate - Monitor daily  weights, strict I/O  Interstitial lung disease: Stable. Dyspnea class IIIB. On 3-4L of Oxygen at home.  - Continue prednisone - back to home 10mg  daily - Continue home albuterol, Symbicort, Dymista, Flonase, claritin  FEN/GI: carb modified diet; saline lock Prophylaxis: Hep sq  Disposition: Pending possible drainage of abscess and ENT recs  Subjective:  Feeling well. Facial swelling the same. Lip continues t obe tender. No other complaints  Objective: Temp:  [98 F (36.7 C)-99.1 F (37.3 C)] 99.1 F (37.3 C) (01/12 0516) Pulse Rate:  [60-80] 61 (01/12 0516) Resp:  [12-20] 12 (01/12 0516) BP: (105-119)/(55-74) 119/74 mmHg (01/12 0516) SpO2:  [98 %-100 %] 100 % (01/12 0516) Physical Exam: General: Pleasant, NAD, morbidly obese. HEENT: NCAT. MMM. L Swollen eyelid. Bilateral buccal swelling. Philtrum and upper lip swollen and tender. EOMI. No erythema or excessive warmth appreciated. Neck: Supple, midline trachea, no lymphadenopathy. Cardiovascular: RRR, no murmurs appreciated. Respiratory: CTAB, normal breath sounds. No wheezes rhonchi or rales. Abdomen: Soft, obese, NTND, no masses, bowel sounds present Extremities: No erythema, no edema.  Laboratory:  Recent Labs Lab 10/27/14 2324 10/29/14 1712 10/30/14 0511  WBC 6.9 7.1 6.8  HGB 12.3 11.9* 11.5*  HCT 36.8 36.0 35.3*  PLT 238 218 229    Recent Labs Lab 10/27/14 2324 10/29/14 1712 10/30/14 0511  NA 132* 134* 129*  K 3.9 4.2 4.4  CL 101 104 101  CO2 21 21 21   BUN 10 6 13   CREATININE 0.97 0.84 0.95  CALCIUM 9.0 9.0 8.9  PROT 7.5  --   --   BILITOT 0.8  --   --   ALKPHOS 66  --   --  ALT 12  --   --   AST 16  --   --   GLUCOSE 325* 219* 419*     Imaging/Diagnostic Tests: CT maxillofacial (1/11): 32 x 13 x 10 mm abscess in the upper lip area centrally. Inflamed/hyperplastic neck nodes bilaterally. No significant dental disease or sinus disease.  Shirlee Latch, MD 10/31/2014, 8:03 AM PGY-1, [facility]  Health Family Medicine FPTS Intern pager: [phone], text pages welcome

## 2014-10-31 NOTE — Progress Notes (Signed)
NURSING PROGRESS NOTE  Makayla Vasquez [id number] Discharge Data: 10/31/2014 2:13 PM Attending Provider: Lind Covert, MD [id number], Sheppard Coil, DO     [name] to be D/C'd Home per MD order.  Discussed with the patient the After Visit Summary and all questions fully answered. All IV's discontinued with no bleeding noted. All belongings returned to patient for patient to take home.   Last Vital Signs:  Blood pressure 102/63, pulse 70, temperature 98.6 F (37 C), temperature source Oral, resp. rate 12, height _0  (1.676 m), weight 98.703 kg (217 lb 9.6 oz), SpO2 100 %.  Discharge Medication List   Medication List    TAKE these medications        ACCU-CHEK SOFTCLIX LANCETS lancets  Test blood sugar 3 times daily.     albuterol 108 (90 BASE) MCG/ACT inhaler  Commonly known as:  PROVENTIL HFA;VENTOLIN HFA  Inhale 2 puffs into the lungs every 4 (four) hours as needed for wheezing or shortness of breath.     bisoprolol 5 MG tablet  Commonly known as:  ZEBETA  Take 0.5 tablets (2.5 mg total) by mouth daily.     budesonide-formoterol 80-4.5 MCG/ACT inhaler  Commonly known as:  SYMBICORT  Inhale 2 puffs into the lungs 2 (two) times daily as needed (for shortness of breath).     cetirizine 10 MG tablet  Commonly known as:  ZYRTEC  Take 1 tablet (10 mg total) by mouth daily.     clindamycin 300 MG capsule  Commonly known as:  CLEOCIN  Take 1 capsule (300 mg total) by mouth every 8 (eight) hours.     fluticasone 50 MCG/ACT nasal spray  Commonly known as:  FLONASE  Place 2 sprays into both nostrils daily as needed for allergies or rhinitis.     furosemide 20 MG tablet  Commonly known as:  LASIX  Take 1 tablet (20 mg total) by mouth daily. For shortness of breath and/or leg swelling.     glucose blood test strip  Commonly known as:  ONE TOUCH ULTRA TEST  Check blood sugar daily before breakfast     Insulin Glargine 100 UNIT/ML Solostar Pen  Commonly known  as:  LANTUS  Inject 10 Units into the skin daily at 10 pm.     losartan 25 MG tablet  Commonly known as:  COZAAR  Take 1 tablet (25 mg total) by mouth 2 (two) times daily.     metFORMIN 500 MG tablet  Commonly known as:  GLUCOPHAGE  Take 1 tablet (500 mg total) by mouth 2 (two) times daily with a meal.     NEXPLANON 68 MG Impl implant  Generic drug:  etonogestrel  1 each by Subdermal route once.     ONE TOUCH ULTRA 2 W/DEVICE Kit  Check blood sugar daily before breakfast     potassium chloride SA 20 MEQ tablet  Commonly known as:  K-DUR,KLOR-CON  Take 20 mEq by mouth daily.     predniSONE 10 MG tablet  Commonly known as:  DELTASONE  Take 10 mg by mouth daily with breakfast.     spironolactone 25 MG tablet  Commonly known as:  ALDACTONE  Take 0.5 tablets (12.5 mg total) by mouth daily.

## 2014-11-01 ENCOUNTER — Inpatient Hospital Stay: Payer: Medicare Other | Admitting: Family Medicine

## 2014-11-02 ENCOUNTER — Encounter: Payer: Self-pay | Admitting: Family Medicine

## 2014-11-02 ENCOUNTER — Telehealth: Payer: Self-pay | Admitting: *Deleted

## 2014-11-02 DIAGNOSIS — K130 Diseases of lips: Secondary | ICD-10-CM | POA: Insufficient documentation

## 2014-11-02 NOTE — Telephone Encounter (Signed)
Pt called stating her blood sugar is 533.  Pt denies any other symptoms.  Pt took Lantus at 9:30 AM 14 Units, metformin at 12 PM.  Pt last ate one hour ago.  Precept with Dr. Leveda Anna; have pt take another 5 Units of Lantus; hold diuretics, drink plenty of fluids and schedule an appt with provider for tomorrow.  Pt stated understanding; appt 11/03/2014 at 3:45 PM with PCP.  Will forward to PCP.   Clovis Pu, RN

## 2014-11-03 ENCOUNTER — Ambulatory Visit (INDEPENDENT_AMBULATORY_CARE_PROVIDER_SITE_OTHER): Payer: Medicare Other | Admitting: Family Medicine

## 2014-11-03 ENCOUNTER — Encounter: Payer: Self-pay | Admitting: Family Medicine

## 2014-11-03 VITALS — Temp 97.9°F | Ht 66.0 in | Wt 218.7 lb

## 2014-11-03 DIAGNOSIS — E669 Obesity, unspecified: Secondary | ICD-10-CM | POA: Diagnosis not present

## 2014-11-03 DIAGNOSIS — K130 Diseases of lips: Secondary | ICD-10-CM | POA: Diagnosis not present

## 2014-11-03 DIAGNOSIS — I5022 Chronic systolic (congestive) heart failure: Secondary | ICD-10-CM | POA: Diagnosis not present

## 2014-11-03 DIAGNOSIS — E1169 Type 2 diabetes mellitus with other specified complication: Secondary | ICD-10-CM

## 2014-11-03 DIAGNOSIS — E119 Type 2 diabetes mellitus without complications: Secondary | ICD-10-CM | POA: Diagnosis not present

## 2014-11-03 MED ORDER — METFORMIN HCL 500 MG PO TABS: 500.0000 mg | ORAL_TABLET | Freq: Two times a day (BID) | ORAL | Status: DC

## 2014-11-03 MED ORDER — FUROSEMIDE 20 MG PO TABS: 20.0000 mg | ORAL_TABLET | Freq: Every day | ORAL | Status: DC

## 2014-11-03 MED ORDER — GLUCERNA ADVANCE SHAKE PO LIQD: ORAL | Status: DC

## 2014-11-03 MED ORDER — INSULIN GLARGINE 100 UNIT/ML SOLOSTAR PEN: 10.0000 [IU] | PEN_INJECTOR | Freq: Every day | SUBCUTANEOUS | Status: DC

## 2014-11-03 MED ORDER — INSULIN PEN NEEDLE 32G X 4 MM MISC: Status: DC

## 2014-11-03 NOTE — Assessment & Plan Note (Signed)
Poorly controlled with labile initial blood sugars in newly dx DM2, insulin naive with recently started Lantus for basal insulin only (continues on Metformin), recently with significant hyperglycemia >500, likely due to chronic prednisone use 10mg  daily (overall tapered down over past 6 months), additionally with recent dx upper lip abscess s/p I&D on abx. Diet with irregular meal schedule likely factor as well. - vitals stable, afebrile, no signs of focal infection (improved lip abscess), CBGs improved without concern for DKA - A1c 9.3 10/24/2014 (last A1c 6.3, 04/2014)  Plan: 1. Check CBG fasting AM daily and recommend 2 hr postprandial lunch or dinner, PRN symptoms 2. Continue Lantus basal insulin only - start 16u daily, instructions given to titrate up by 2 units daily if fasting AM CBG >180, max of 30u daily (then advised to schedule f/u apt) 3. Continue Metformin 500mg  BID - concern about titrating up given cardiology's previous concern with chronic systolic CHF III-B symptoms 4. Encourage regular meal schedule, plan for breakfast and dinner daily with glucerna supplement for lunch, snacks between meals, stressed importance of regular eating pattern, improve DM diet, reduce carbs, inc vegetables 5. Referral to Dr. 05/2014 for DM nutrition - card given, pt to call 6. RTC 3 months for A1c, DM f/u - sooner if persistent elevated CBGs (consider future short acting mealtime insulin if basal >30-40 units daily)

## 2014-11-03 NOTE — Progress Notes (Signed)
   Subjective:    Patient ID: Makayla Vasquez, female    DOB: 1981/10/10, 34 y.o.   MRN: [id number]  Patient presents today for Hospital Follow-up.  HPI  Brief summary of recent course with multiple hospitalizations:  1) Admission - 10/24/14 to 10/26/14 - for dizziness and hyperglycemia, dx with Diabetes Mellitus Type 2 (likely worsened impaired glucose tolerance in setting of obesity and chronic prednisone use), started on insulin, additionally had scheduled cardiac cath (RHC) performed on 10/26/14 - showed normal hemodynamics with low filling pressure, suspect lung infiltrates shown on xray are not cardiac in nature. Recommend pulmonary workup and possible lung biopsy. Repeat echo as outpatient. If EF still depressed consider outpatient Myoview. Discharged with Lantus insulin.  2) ED visit 10/28/14 - Hyperglycemia - work-up negative, no evidence DKA. Improved after IVF and insulin. Discharged from ED. Increased home Lantus dose  3) Admission - 10/29/14 - 10/31/14 - Facial swelling and pain, thought to be angioedema, maxillofacial CT scan showed upper lip abscess, referral to ENT on 10/31/14 for I&D, placed on Clindamycin and Doxycycline PO abx  DM, Type 2 (new diagnosis, prior chronic glucose intolerance): Reports - [facility]/14 with elevated CBG 533, advised to take 5 additional Lantus units, hold Lasix f/u today. CBGs recently with some fluctuation  CBGs: Avg 230s, Low >100, High 533. Checks CBGs daily in AM (fasting) and PRN symptoms Meds: Lantus 10u daily, if over 150 go up 2 units, if over 200 go up to 14 units Reports  good compliance. Tolerating well w/o side-effects Currently on ARB Diet (concerned about diet as she is not a "big eater" usually eats 2 meals daily without snacks, talked to dietician in hospital planned on starting glucerna) Denies hypoglycemia, polyuria, visual changes, numbness or tingling.  PMH: complete AV block and cardiac pacemaker (since age 80 ), chronic systolic  heart failure, chronic resp failure, sinus tachycardia, Interstitial lung disease, chronic steroid use, hypertension and diabetes.    I have reviewed and updated the following as appropriate: allergies and current medications  Social Hx: - Former smoker  Review of Systems  See above HPI    Objective:   Physical Exam  Temp(Src) 97.9 F (36.6 C) (Oral)  Ht 5\' 6"  (1.676 m)  Wt 218 lb 11.2 oz (99.202 kg)  BMI 35.32 kg/m2  On 4L home O2 continuous  Gen - well-appearing, comfortable, NAD HEENT - face without edema, tenderness or erythema, mild tenderness on front of nasal cartilage without obvious sign of infection or abscess within nares, oropharynx clear and inside upper lip without sign of persistent abscess or infection (healed s/p I&D), MMM Neck - supple, non-tender, no LAD Heart - RRR, no murmurs heard Lungs - CTAB, no wheezing, crackles, or rhonchi. Normal work of breathing. Skin - warm, dry, no rashes     Assessment & Plan:   See specific A&P problem list for details.

## 2014-11-03 NOTE — Patient Instructions (Signed)
Dear Makayla Vasquez, Thank you for coming in to clinic today. It was good to see you again!  1. For your Diabetes and Blood Sugar - there are a few factors that are raising your sugar (prednisone / steroids raise your sugars) and (abscess infection) these make your sugars elevated and hard to control. - Blood Sugar Plan: - Inject Lantus 16 units daily (in morning around 10am) - If your fasting CBG (in AM before food/drink) is >180, increase by 2 units (and start at that increased level the next day) - up to 18 units at 10am, then next day if >180 again go to 20u and so on - If you get to 30 units Lantus, then stay at that level. Call to schedule follow-up at this point  - Get the Glucerna meal substitute shake - drink one for lunch daily (and have a snack if needed between meals) - Try to eat 2 regular meals EVERY day - to get on a regular schedule (breakfast and dinner) - Call Dr. Gerilyn Pilgrim to schedule appointment to meet regarding Diabetic Nutrition - [phone])  2. Finish Clindamycin antibiotic and take Doxycycline antibiotic (should be at home - call with name of this medicine if it is different) 3. Refilled Lasix - take daily 4. Refilled Metformin continue 5. Follow-up with Pulmonology in 1 month to discuss Oxygen use and Prednisone  Please schedule a follow-up appointment with Dr. Althea Charon in 3 months for Diabetes follow-up  If you have any other questions or concerns, please feel free to call the clinic to contact me. You may also schedule an earlier appointment if necessary.  However, if your symptoms get significantly worse, please go to the Emergency Department to seek immediate medical attention.  Saralyn Pilar, DO [facility]

## 2014-11-03 NOTE — Assessment & Plan Note (Signed)
Improved, lip area appears healed, symptoms mostly resolved now with just some nasal pain (exam normal and no evidence nasal infection on CT) - s/p ENT I&D on 10/31/14  Plan: 1. Continue Clindamycin and Doxycycline PO per ENT 2. F/u with ENT if persistent or worsening nasal symptoms

## 2014-11-16 ENCOUNTER — Encounter: Payer: Medicare Other | Attending: Family Medicine

## 2014-11-16 VITALS — Ht 66.0 in | Wt 218.0 lb

## 2014-11-16 DIAGNOSIS — Z6835 Body mass index (BMI) 35.0-35.9, adult: Secondary | ICD-10-CM | POA: Diagnosis not present

## 2014-11-16 DIAGNOSIS — E119 Type 2 diabetes mellitus without complications: Secondary | ICD-10-CM | POA: Insufficient documentation

## 2014-11-16 DIAGNOSIS — Z713 Dietary counseling and surveillance: Secondary | ICD-10-CM | POA: Insufficient documentation

## 2014-11-19 ENCOUNTER — Other Ambulatory Visit: Payer: Self-pay | Admitting: Family Medicine

## 2014-11-19 DIAGNOSIS — E669 Obesity, unspecified: Principal | ICD-10-CM

## 2014-11-19 DIAGNOSIS — E1169 Type 2 diabetes mellitus with other specified complication: Secondary | ICD-10-CM

## 2014-11-19 NOTE — Progress Notes (Signed)
Patient was seen on 11/16/14 for the first of a series of three diabetes self-management courses at [facility].  Patient Education Plan per assessed needs and concerns is to attend four course education program for Diabetes Self Management Education.  The following learning objectives were met by the patient during this class:  Describe diabetes  State some common risk factors for diabetes  Defines the role of glucose and insulin  Identifies type of diabetes and pathophysiology  Describe the relationship between diabetes and cardiovascular risk  State the members of the Healthcare Team  States the rationale for glucose monitoring  State when to test glucose  State their individual Target Range  State the importance of logging glucose readings  Describe how to interpret glucose readings  Identifies A1C target  Explain the correlation between A1c and eAG values  State symptoms and treatment of high blood glucose  State symptoms and treatment of low blood glucose  Explain proper technique for glucose testing  Identifies proper sharps disposal  Handouts given during class include:  Living Well with Diabetes book  Carb Counting and Meal Planning book  Meal Plan Card  Carbohydrate guide  Meal planning worksheet  Low Sodium Flavoring Tips  The diabetes portion plate  W0J to eAG Conversion Chart  Diabetes Medications  Diabetes Recommended Care Schedule  Support Group  Diabetes Success Plan  Core Class Satisfaction Survey  Follow-Up Plan:  Attend core 2

## 2014-11-20 NOTE — Telephone Encounter (Signed)
Open in error

## 2014-11-22 ENCOUNTER — Other Ambulatory Visit: Payer: Self-pay | Admitting: Family Medicine

## 2014-11-23 ENCOUNTER — Encounter: Payer: Medicare Other | Attending: Family Medicine

## 2014-11-23 DIAGNOSIS — E119 Type 2 diabetes mellitus without complications: Secondary | ICD-10-CM | POA: Diagnosis not present

## 2014-11-23 DIAGNOSIS — Z713 Dietary counseling and surveillance: Secondary | ICD-10-CM | POA: Insufficient documentation

## 2014-11-23 DIAGNOSIS — Z6835 Body mass index (BMI) 35.0-35.9, adult: Secondary | ICD-10-CM | POA: Diagnosis not present

## 2014-11-23 DIAGNOSIS — E1169 Type 2 diabetes mellitus with other specified complication: Secondary | ICD-10-CM

## 2014-11-23 DIAGNOSIS — E669 Obesity, unspecified: Secondary | ICD-10-CM

## 2014-11-23 NOTE — Progress Notes (Signed)

## 2014-11-27 ENCOUNTER — Telehealth: Payer: Self-pay | Admitting: Family Medicine

## 2014-11-27 DIAGNOSIS — L819 Disorder of pigmentation, unspecified: Secondary | ICD-10-CM

## 2014-11-27 NOTE — Telephone Encounter (Signed)
Pt called and would like another referral to her dermatology doctor. It has been more than year. Please call patient and inform. [email or web address]

## 2014-11-30 ENCOUNTER — Ambulatory Visit: Payer: Medicare Other

## 2014-11-30 ENCOUNTER — Other Ambulatory Visit: Payer: Self-pay | Admitting: Internal Medicine

## 2014-12-01 ENCOUNTER — Telehealth (HOSPITAL_COMMUNITY): Payer: Self-pay | Admitting: Vascular Surgery

## 2014-12-01 DIAGNOSIS — L819 Disorder of pigmentation, unspecified: Secondary | ICD-10-CM | POA: Insufficient documentation

## 2014-12-01 MED ORDER — LOSARTAN POTASSIUM 25 MG PO TABS: 25.0000 mg | ORAL_TABLET | Freq: Two times a day (BID) | ORAL | Status: DC

## 2014-12-01 NOTE — Addendum Note (Signed)
Addended by: Smitty Cords on: 12/01/2014 01:09 PM   Modules accepted: Orders

## 2014-12-01 NOTE — Telephone Encounter (Signed)
New prescription for Cozarr

## 2014-12-11 ENCOUNTER — Encounter: Payer: Self-pay | Admitting: Internal Medicine

## 2014-12-11 ENCOUNTER — Ambulatory Visit (INDEPENDENT_AMBULATORY_CARE_PROVIDER_SITE_OTHER)
Admission: RE | Admit: 2014-12-11 | Discharge: 2014-12-11 | Disposition: A | Discharge status: Home / Self Care | Payer: Medicare Other | Source: Ambulatory Visit | Attending: Internal Medicine | Admitting: Internal Medicine

## 2014-12-11 ENCOUNTER — Ambulatory Visit (INDEPENDENT_AMBULATORY_CARE_PROVIDER_SITE_OTHER): Payer: Medicare Other | Admitting: Internal Medicine

## 2014-12-11 VITALS — BP 112/72 | HR 89 | Ht 65.0 in | Wt 204.6 lb

## 2014-12-11 DIAGNOSIS — Z950 Presence of cardiac pacemaker: Secondary | ICD-10-CM | POA: Diagnosis not present

## 2014-12-11 DIAGNOSIS — J9611 Chronic respiratory failure with hypoxia: Secondary | ICD-10-CM

## 2014-12-11 DIAGNOSIS — S27309S Unspecified injury of lung, unspecified, sequela: Secondary | ICD-10-CM

## 2014-12-11 DIAGNOSIS — J849 Interstitial pulmonary disease, unspecified: Secondary | ICD-10-CM

## 2014-12-11 DIAGNOSIS — J45909 Unspecified asthma, uncomplicated: Secondary | ICD-10-CM | POA: Diagnosis not present

## 2014-12-11 DIAGNOSIS — R918 Other nonspecific abnormal finding of lung field: Secondary | ICD-10-CM | POA: Diagnosis not present

## 2014-12-11 DIAGNOSIS — Z0000 Encounter for general adult medical examination without abnormal findings: Secondary | ICD-10-CM | POA: Diagnosis not present

## 2014-12-11 NOTE — Patient Instructions (Signed)
Order- CXR     Dx Acute Lung Injury/ pneumonitis  Try running your oxygen at 2-3L/m   Try reducing prednisone to 1/2 x 10 mg tab daily = 5 mg daily

## 2014-12-11 NOTE — Progress Notes (Signed)
02/23/14- 36 yoF former smoker Referred by Dr Bridgett Larsson for allergy consult.  C/o itchy, watery eyes, sinus congestion, headaches, prodcough with green mucous X several years. Mother and son are here She had been going to allergist at [facility] for allergic rhinitis, itching eyes and chest tightness for years as an adult. Typically worse in spring and summer, with occasional colds and with exposure to house dust. Daily wheeze.She tried allergy shots for 2 months, 2 years ago but had strong local reactions. 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 Mother has hx allergies. CXR 5/ 8 /14 IMPRESSION:  No active disease. No significant change.  Original Report Authenticated By: Lahoma Crocker, M.D.  04/06/14- 67 yoF former smoker Referred by Dr Bridgett Larsson for allergy consult.  C/o itchy, watery eyes, sinus congestion, headaches, prodcough with green mucous X several years. Mother and son are here FOLLOWS FOR: Allergies have been doing well until outdoors with trees,etc. Allergy profile 02/23/2014-negative with total IgE 22.9. She does give history of allergy shots in the past, stopped because of large local reactions. Past history suggests stronger allergic reactions when she was younger. We can retest her if there is a question She still reports some itching in her throat and cough sometimes green sputum  06/27/14 [facility] follow up  Returns for follow up from recent hospital stay.  Reports breathing is doing well today. Is suppose to be on O2 at 4lm however did not bring O2 b/c   portable tank at home is malfunctioned. DME contacted and at home to fix system. Advised on importance of O2 use.  Says were very low off Oxygen - sats 54%-83% on room air unpon entering the exam room. Sats increased 90% on 4l/m  Admitted last week for significant hypoxia   Patient presented with hypoxemia of unknown etiology, requiring 4L of oxygen. Notably, she was hospitalized from 8/11-8/14 with a  similar presentation and was thought to have a viral pneumonitis complicated by atelectasis and mild edema vs. an autoimmune process and was discharged home on a course of levaquin and prednisone.  Her CT scan from 8/28 showed bilateral progressive lower peribronchial infiltrates with groundglass changes and nodular patter consistent with acute pneumonitis of unclear etiology. During her last admission, she was found to have a positive ANA, mildly elevated ESR, non-reactive HIV, and negative PPD. Also during her last hospitalization, cardiology saw her for a repeat echo that showed a decrease in EF (25-30%, from baseline 30-25% in 2013).  Viral panel was positive for rhinovirus previous admission but neg for this admit .  ESR was elevated 62 , decreased today at 55.  Pulmonology was consulted and did a BAL on 9/1 that showed 60% macrophages and 30% PMNs. Thus far, her AFB smear, fungal smear, legionella Ab, pneumocystis smear, ACE, and sputum culture are negative. Fungal neg , bal cx neg w/ non path oral flora , legionella neg  Has family hx of Lupus.  Anti smith ab neg and anti dsDNA neg  HSP panel was neg.  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 Status: Edited Related Problem: ILD (interstitial lung disease)    #1 bilateral progressive lower lobe peribronchial infiltrates with groundglass changes and nodular pattern consistent with acute pneumonitis of unclear etiology.  Note previous serologic studies show a speckled positive ANA 1-80 pattern and no evidence of  allergy factors or elevated IgE. This inflammatory pneumonitis is not yet discerned as to etiology therefore bronchoscopy would be indicated and will be pursued  Previous viral panel + for rhinovirus  BAL cx neg to date.  HSP neg .  Will cont on steroid challenge with close  follow up  Case discussed with Dr. Annamaria Boots  Clinically she appears stable , will have her back in office with follow up cxr in 1 week as cxr w/ worse aeration today  Plan  Taper Prednisone $RemoveBeforeDE'40mg'RwZsNsPoxWiHLEf$  daily and hold at this dose.  Continue on Oxygen 4l/m continuous flow 24/7 .  Labs and chest xray today .  Follow up Dr. Annamaria Boots In 1 weeks and As needed  Please contact office for sooner follow up if symptoms do not improve or worsen or seek emergency care    07/06/14-32 yoF former smoker originaly referred for allergy evaluation, rhinitis, asthmatic bronchitis, then hosp for ?ALI with lower zone pneumonitis/ BAL and special studies NEG,. Acute on Chronic respiratory failure. Pacemaker for AV block  O2 4L/ Advanced FOLLOWS FOR: Pt c/o DOE, prod cough with clear mucus and chest tightness when coughing. Pt last saw TP on 06/27/2014 for HFU .   08/07/14- 33 yoF former smoker originaly referred for allergy evaluation, rhinitis, asthmatic bronchitis, then hosp for ?ALI with lower zone pneumonitis/ BAL and special studies NEG,. Acute on Chronic respiratory failure. Pacemaker for AV block  O2 4L/ Advanced      Husband and son are here Continues oxygen but feels comfortable at rest taking it off for short intervals. As instructed, prednisone is now down to 20 mg daily for the last 2 weeks. She feels stable, not worse. Coughs a lot, clear mucus. Denies fever, blood, purulent sputum, edema, adenopathy. Pending cardiac ablation seeking better control of heart rate. CXR 07/06/14 IMPRESSION:  1. No change in aeration to the lungs compared with the previous  exam.  Electronically Signed  By: Kerby Moors M.D.  On: 07/06/2014 15:56  10/08/14-  43 yoF former smoker originaly referred for allergy evaluation, rhinitis, asthmatic bronchitis, then hosp for ?ALI with lower zone pneumonitis/ BAL and special studies NEG,. Acute on Chronic respiratory failure. Pacemaker for AV block  O2 4L/ Advanced. Has continued prednisone 20 mg  daily. FOLLOW FOR:  hypoxemia; breathing doing fine, wants to get off oxygen; no complaints  Arrived 78% on 2L, 90% on 4L.[name]  Husband and son here. Cough with clear phlegm. Feels better with no acute events. CXR 09/18/14  IMPRESSION: Bibasilar pulmonary infiltrates, improved from 08/07/2014. Electronically Signed  By: Marcello Moores Register  On: 09/18/2014 13:35   12/11/14- 79 yoF former smoker originaly referred for allergy evaluation, rhinitis, asthmatic bronchitis, then hosp for ?ALI with lower zone pneumonitis/ BAL and special studies NEG,. Acute on Chronic respiratory failure/ Hypoxemia,. Pacemaker for AV block  O2 4L/ Advanced. Has continued prednisone 10 mg daily. FOLLOWS FOR: Pt states her breathing is doing well overall and continues to wear O2 through [facility]. CT max fac 10/30/14 IMPRESSION: 32 x 13 x 10 mm abscess in the upper lip area centrally. Inflamed/hyperplastic neck nodes bilaterally. No significant dental disease or sinus disease. Electronically Signed  By: Kalman Jewels M.D.  On: 10/30/2014 14:31  ROS-see HPI Constitutional:   No-   weight loss, night sweats, fevers, chills, fatigue, lassitude. HEENT:   , difficulty swallowing, tooth/dental problems, sore throat,       +sneezing, +itching, ear ache, +nasal congestion, post nasal drip,  CV:  No-   chest  pain, orthopnea, PND, swelling in lower extremities, anasarca,                                  dizziness, palpitations Resp: No-   shortness of breath with exertion or at rest.              +productive cough,  No non-productive cough,  No- coughing up of blood.               No- wheezing.   Skin: No-   rash or lesions. GI:  + heartburn, +indigestion, abdominal pain, nausea, vomiting,  GU:  MS:  No-   joint pain or swelling.   Neuro-     nothing unusual Psych:  No- change in mood or affect. No depression or anxiety.  No memory loss.  OBJ- Physical Exam   O2 4 L 99% General- Alert, Oriented, Affect-appropriate,  Distress- none acute, + wheelchair Skin- +acanthotic hyperpigmentation around neck Lymphadenopathy- none Head- atraumatic            Eyes- Gross vision intact, PERRLA, conjunctivae and secretions clear            Ears- Hearing, canals-normal            Nose- +, no-Septal dev, mucus, polyps, erosion, perforation             Throat- Mallampati III , mucosa clear , drainage- none, tonsils- atrophic Neck- flexible , trachea midline, no stridor , thyroid nl, carotid no bruit Chest - symmetrical excursion , unlabored           Heart/CV- RRR , no murmur , no gallop  , no rub, nl s1 s2                           - JVD- none , edema- none, stasis changes- none, varices- none           Lung- clear to P&A diminished in bases, wheeze- none, cough+ light , dullness-none, rub- none.           Chest wall-  Abd-  Br/ Gen/ Rectal- Not done, not indicated Extrem- cyanosis- none, clubbing, none, atrophy- none, strength- nl Neuro- grossly intact to observation

## 2014-12-12 ENCOUNTER — Ambulatory Visit: Payer: Medicare Other

## 2014-12-14 ENCOUNTER — Encounter: Payer: Self-pay | Admitting: Family Medicine

## 2014-12-14 ENCOUNTER — Ambulatory Visit (INDEPENDENT_AMBULATORY_CARE_PROVIDER_SITE_OTHER): Payer: Medicare Other | Admitting: Family Medicine

## 2014-12-14 VITALS — BP 120/75 | HR 90 | Temp 98.1°F | Ht 65.0 in | Wt 206.0 lb

## 2014-12-14 DIAGNOSIS — E669 Obesity, unspecified: Secondary | ICD-10-CM | POA: Diagnosis not present

## 2014-12-14 DIAGNOSIS — B351 Tinea unguium: Secondary | ICD-10-CM | POA: Diagnosis not present

## 2014-12-14 DIAGNOSIS — E119 Type 2 diabetes mellitus without complications: Secondary | ICD-10-CM | POA: Diagnosis not present

## 2014-12-14 DIAGNOSIS — E1169 Type 2 diabetes mellitus with other specified complication: Secondary | ICD-10-CM

## 2014-12-14 MED ORDER — EFINACONAZOLE 10 % EX SOLN: CUTANEOUS | Status: DC

## 2014-12-14 NOTE — Patient Instructions (Signed)
Dear Makayla Vasquez, Thank you for coming in to clinic today. It was good to see you again!  1. For your Diabetes and Blood Sugar - there are a few factors that are raising your sugar (prednisone / steroids raise your sugars) and (abscess infection) these make your sugars elevated and hard to control. - Blood Sugar Plan: - Increase LANTUS to 20 units daily before 10 AM - If your fasting CBG (in AM before food/drink) is >180, increase by 2 units (and start at that increased level the next day) - If you get to 30 units Lantus, then stay at that level. Call to schedule follow-up at this point  - Get the Glucerna meal substitute shake - drink one for lunch daily (and have a snack if needed between meals) - Try to eat 2 regular meals EVERY day - to get on a regular schedule (breakfast and dinner)  2. For your Toe - Looks like fungal infection - Prescribed Jublia (Efinaconazole) - topical place 1 drop on toenail every day for 48 weeks - if run out or need more refills, please call. - Avoid pedicure during this treatment  Please schedule a follow-up appointment with Dr. Althea Charon in 1 to 2 months for Diabetes follow-up  If you have any other questions or concerns, please feel free to call the clinic to contact me. You may also schedule an earlier appointment if necessary.  However, if your symptoms get significantly worse, please go to the Emergency Department to seek immediate medical attention.  Saralyn Pilar, DO [facility]

## 2014-12-14 NOTE — Assessment & Plan Note (Addendum)
Improved control, per report still with some elevated CBGs 300s. Not due for A1c yet. - Lip abscess resolved, no longer on abx - Recently reduced chronic prednisone 10mg  to 5mg  daily - DM Foot Exam - normal (intact b/l sensation to monofilament testing)  Plan: 1. Inc Lantus to 20u daily in AM 2. Check daily fasting CBG if >180-200, increase lantus by 2u every 3 to 5 days, up to 30u daily 3. Cont Metformin 500mg  BID 4. Improved DM diet, s/p DM nutrition 5. RTC 1-2 months for next A1c, DM follow-up

## 2014-12-14 NOTE — Assessment & Plan Note (Signed)
Consistent with R-great toe onychomycosis, occasional pains. No evidence of ingrown toenail or nailbed infection. - DM Foot exam normal, intact monofilament sensation b/l  Plan: 1. Efinaconazole 10% topical - 1 drop daily x 48 weeks - rx 8 mL with 1 refill (requested topical and not oral treatment) 2. Re-evaluate in 1-2 months at next visit

## 2014-12-14 NOTE — Progress Notes (Signed)
   Subjective:    Patient ID: Makayla Vasquez, female    DOB: 08/18/1981, 34 y.o.   MRN: [id number]  Patient presents today for Hospital Follow-up.  HPI  RIGHT TOENAIL: - Reports Right great toenail hurting intermittently for past 2 weeks, denies any injury, fall, or accident. Concerned that toenail appears "thicker" than other toenails. Has had prior history of ingrown toenail on this side (can't remember how long ago). Currently describes toenail discomfort worse with weight bearing, not limiting ambulation. - Denies any redness or swelling surrounding nailbed, without drainage or bleeding, numbness, tingling  CHRONIC DM, Type 2: Reports - went to DM nutrition education classes already CBGs: Avg 200s, Low low 100s, High 400. Checks CBGs 1x daily Meds: Lantus 18u daily in AM, Metformin 500mg  BID Reports good compliance. Tolerating well w/o side-effects Currently on ARB Lifestyle: Diet (improving DM diet) / Exercise (not regular) Denies hypoglycemia, polyuria, visual changes, numbness or tingling.  CHRONIC LUNG INJURY / CHRONIC RESPIRATORY FAILURE - Recently saw Pulmonology 12/11/14 - Repeat CXR with stable lung base opacity, likely chronic. No acute findings - Wean down O2 req - from 4L continuous to 2-3 L continuous. Doing well today - Taper chronic prednisone from 10mg  daily to 5mg  daily (half tabs)   I have reviewed and updated the following as appropriate: allergies and current medications  Social Hx: - Former smoker  Review of Systems  See above HPI    Objective:   Physical Exam  BP 120/75 mmHg  Pulse 90  Temp(Src) 98.1 F (36.7 C) (Oral)  Ht 5\' 5"  (1.651 m)  Wt 206 lb (93.441 kg)  BMI 34.28 kg/m2  LMP 12/06/2014 (Approximate)  On 2 to 3 L home O2 continuous  Gen - well-appearing, comfortable, NAD HEENT - [place] in place, MMM Heart - RRR, no murmurs heard Lungs - CTAB, no wheezing, crackles, or rhonchi. Non-labored Ext - Right Great Toenail with thickening changes  consistent with onychomycosis, other toenails appear mostly normal. No nailbed erythema, edema, tenderness, drainage. No evidence of ingrown toenail. Bilateral foot exam with dry skin, no ulceration or lesions. No edema, non-tender. Skin - warm, dry, no rashes Neuro - Gait normal  DM Foot exam - documented in separate location of chart     Assessment & Plan:   See specific A&P problem list for details.

## 2014-12-15 ENCOUNTER — Encounter: Payer: Self-pay | Admitting: *Deleted

## 2014-12-15 NOTE — Progress Notes (Signed)
Prior Authorization received from [facility] pharmacy for Jublia 10%. Formulary and PA form placed in provider box for completion. Clovis Pu, RN

## 2014-12-17 NOTE — Assessment & Plan Note (Signed)
Multifactorial. There is chronic parenchymal scarring, residual from acute lung injury She continues to require oxygen but I suggested she reduce flow rate to 2-3 L/m. Any improvement is likely to be slow, with remodeling.

## 2014-12-17 NOTE — Assessment & Plan Note (Signed)
Residual scarring after acute lung injury with parenchymal inflammation which was probably infection originally. A component of seronegative sarcoid is not ruled out.  plan-try reducing maintenance prednisone from 10 mg daily to 5 mg.

## 2014-12-19 NOTE — Progress Notes (Signed)
Reviewed chart, last seen at [facility] by me on 12/14/14 dx Right great toe onychomycosis. Rx Jublia topical at that time, sent rx however not covered and required PA. Reviewed PA form, no alternatives covered. Called pharmacy, confirmed this and inquired about Terbinafine oral (also not covered). Called patient and advised to start with topical Lamisil OTC as initial therapy, since no other rx covered. Advised to try for at least 1-3 months, follow instructions, follow-up as needed to monitor progress and will re-attempt PA if failed OTC therapy. May refer to Podiatrist as needed.  PA paperwork not completed, discussed with Dorisann Frames.  Saralyn Pilar, DO [facility], PGY-2

## 2014-12-26 ENCOUNTER — Encounter (HOSPITAL_COMMUNITY): Payer: Self-pay

## 2014-12-26 ENCOUNTER — Ambulatory Visit (HOSPITAL_COMMUNITY)
Admission: RE | Admit: 2014-12-26 | Discharge: 2014-12-26 | Disposition: A | Discharge status: Home / Self Care | Payer: Medicare Other | Source: Ambulatory Visit | Attending: Cardiology | Admitting: Cardiology

## 2014-12-26 VITALS — BP 108/68 | HR 89 | Wt 203.5 lb

## 2014-12-26 DIAGNOSIS — I509 Heart failure, unspecified: Secondary | ICD-10-CM

## 2014-12-26 DIAGNOSIS — Z87891 Personal history of nicotine dependence: Secondary | ICD-10-CM | POA: Insufficient documentation

## 2014-12-26 DIAGNOSIS — F419 Anxiety disorder, unspecified: Secondary | ICD-10-CM | POA: Diagnosis not present

## 2014-12-26 DIAGNOSIS — R918 Other nonspecific abnormal finding of lung field: Secondary | ICD-10-CM | POA: Diagnosis not present

## 2014-12-26 DIAGNOSIS — I10 Essential (primary) hypertension: Secondary | ICD-10-CM | POA: Insufficient documentation

## 2014-12-26 DIAGNOSIS — I429 Cardiomyopathy, unspecified: Secondary | ICD-10-CM | POA: Insufficient documentation

## 2014-12-26 DIAGNOSIS — K219 Gastro-esophageal reflux disease without esophagitis: Secondary | ICD-10-CM | POA: Diagnosis not present

## 2014-12-26 DIAGNOSIS — F319 Bipolar disorder, unspecified: Secondary | ICD-10-CM | POA: Diagnosis not present

## 2014-12-26 DIAGNOSIS — E119 Type 2 diabetes mellitus without complications: Secondary | ICD-10-CM | POA: Insufficient documentation

## 2014-12-26 DIAGNOSIS — Z950 Presence of cardiac pacemaker: Secondary | ICD-10-CM | POA: Insufficient documentation

## 2014-12-26 DIAGNOSIS — E669 Obesity, unspecified: Secondary | ICD-10-CM | POA: Insufficient documentation

## 2014-12-26 DIAGNOSIS — I5022 Chronic systolic (congestive) heart failure: Secondary | ICD-10-CM | POA: Diagnosis not present

## 2014-12-26 DIAGNOSIS — Z79899 Other long term (current) drug therapy: Secondary | ICD-10-CM | POA: Diagnosis not present

## 2014-12-26 DIAGNOSIS — Q246 Congenital heart block: Secondary | ICD-10-CM | POA: Diagnosis not present

## 2014-12-26 DIAGNOSIS — J9611 Chronic respiratory failure with hypoxia: Secondary | ICD-10-CM

## 2014-12-26 DIAGNOSIS — Z9981 Dependence on supplemental oxygen: Secondary | ICD-10-CM | POA: Diagnosis not present

## 2014-12-26 DIAGNOSIS — Z7952 Long term (current) use of systemic steroids: Secondary | ICD-10-CM | POA: Diagnosis not present

## 2014-12-26 DIAGNOSIS — Z794 Long term (current) use of insulin: Secondary | ICD-10-CM | POA: Insufficient documentation

## 2014-12-26 DIAGNOSIS — J9610 Chronic respiratory failure, unspecified whether with hypoxia or hypercapnia: Secondary | ICD-10-CM | POA: Insufficient documentation

## 2014-12-26 MED ORDER — SPIRONOLACTONE 25 MG PO TABS: 25.0000 mg | ORAL_TABLET | Freq: Every day | ORAL | Status: DC

## 2014-12-26 NOTE — Progress Notes (Signed)
Patient ID: Makayla Vasquez, female   DOB: 15-Nov-1980, 34 y.o.   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 Lovena Vasquez for HF evaluation.   She has a h/o congenital HB and underwent first PPM at age 31. In 2011 had ECHO with EF 35-40%. LV dysfunction felt to be due to RV pacing so upgraded to CRT-P. Recent echo in 10/15 with EF 25%. She has had worsening HF symptoms and referred for evaluation for advanced HF therapies. She also has concomitant lung disease and this has limited b-blocker dosing.  She follows with Dr. Annamaria Vasquez for her lung disease. She is a former smoker.  Admitted in 8/15 and 9/15 with worsening respiratory status and severe hypoxia. Had extensive w/u. Thought to have a viral pneumonitis complicated by atelectasis and mild edema vs. an autoimmune process. Discharged home on a course of levaquin and prednisone.   06/16/14 CT scan showed bilateral progressive lower peribronchial infiltrates with groundglass changes and nodular patter consistent with acute pneumonitis of unclear etiology.  06/20/14 BAL showed 60% macrophages and 30% PMNs. Her AFB smear, fungal smear, legionella Ab, pneumocystis smear, ACE, and sputum culture are negative. Her AFB, legionella, fungal, and BAL culture as well as her fungal Ab, hypersensitivity pneumonitis panel were all negative Serology: DsDNA, RF, ANCA, HSP all negative  She was offered a lung biopsy for further diagnosis versus empiric treatment with steroids. 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  Saw Dr. Annamaria Vasquez on 06/27/14. CXR with persistent infiltrates of unclear etiology. Prednisone weaned down now taking 73m  one day and 154mthe next.  Recently had EP study for persistent tachycardia and found to have sinus tach.   She returns for follow up. She continues to wear oxygen continuously. The end of February she was evaluated by Dr Makayla Bootsith recommendations to cut back prednisone.  Denies SOB/PND/Orthopnea. Not weighing at home because she does not have a scale. She  does not take extra lasix. Able to get all medications.   PFTs (12/15) showed a severe restrictive defect concerning for an interstitial process.   RHDalton/04/2015  RA = 2 RV = 27/1/5 PA = 29/7 (18) PCW = 4 Fick cardiac output/index = 4.5/2.2 PVR = 3.0 WU FA sat = 98% PA sat = 64%, 61%  Labs (10/15): TSH normal K 3.6, creatinine 1.1 Labs (10/31/2014) L k 3.9 Creatinine 1.06   Review of Systems: All systems reviewed and negative except as per HPI.   Past Medical History  Diagnosis Date  . Cardiac pacemaker     a. Since age [age] 1966b. Upgrade to BiV in 2013.  . Congenital complete AV block   . Obesity   . GERD (gastroesophageal reflux disease)   . Asymptomatic LV dysfunction     a. Echo in Dec 2011 with EF 35 to 40%. Felt to be due to paced rhythm. b. EF 25-30% in 07/2014.  . Seizures     as a child- from high fever  . Anxiety   . Bipolar affective disorder   . Depression     bipolar  . Carpal tunnel syndrome of right wrist   . Asthma  seasonal allergies   . Arthritis     rheumatoid arthritis- mild, no rheumatology care   . Hypertension   . Pneumonitis     a. a/w hypoxia - inflammatory - large workup 07/2014.  [name] Sinus tachycardia   . Diabetes mellitus without complication   . Presence of permanent cardiac pacemaker   . CHF (congestive heart failure)     Current Outpatient Prescriptions  Medication Sig Dispense Refill  . ACCU-CHEK SOFTCLIX LANCETS lancets Test blood sugar 3 times daily. 100 each 12  . albuterol (PROVENTIL HFA;VENTOLIN HFA) 108 (90 BASE) MCG/ACT inhaler Inhale 2 puffs into the lungs every 4  (four) hours as needed for wheezing or shortness of breath. 1 each 1  . bisoprolol (ZEBETA) 5 MG tablet Take 0.5 tablets (2.5 mg total) by mouth daily. 15 tablet 3  . Blood Glucose Monitoring Suppl (ONE TOUCH ULTRA 2) W/DEVICE KIT Check blood sugar daily before breakfast 1 each 0  . budesonide-formoterol (SYMBICORT) 80-4.5 MCG/ACT inhaler Inhale 2 puffs into the lungs 2 (two) times daily as needed (for shortness of breath).     . Efinaconazole 10 % SOLN Apply one drop to affected toenail once daily for total 48 weeks 8 mL 1  . etonogestrel (NEXPLANON) 68 MG IMPL implant 1 each by Subdermal route once.    . furosemide (LASIX) 20 MG tablet Take 1 tablet (20 mg total) by mouth daily. For shortness of breath and/or leg swelling. 30 tablet 5  . glucose blood (ONE TOUCH ULTRA TEST) test strip Check blood sugar daily before breakfast 100 each 0  . Insulin Glargine (LANTUS SOLOSTAR) 100 UNIT/ML Solostar Pen Inject 15-30 Units into the skin daily. 15 mL 2  . Insulin Pen Needle (INSUPEN PEN NEEDLES) 32G X 4 MM MISC Inject insulin via insulin pen 1-2 x daily 50 each 5  . KLOR-CON M20 20 MEQ tablet TAKE 40 MEQ TODAY (2 TABLETS) THEN TAKE 20 MEQ ON DAYS THAT LASIX IS TAKEN 30 tablet 1  . losartan (COZAAR) 25 MG tablet Take 1 tablet (25 mg total) by mouth 2 (two) times daily. 60 tablet 3  . metFORMIN (GLUCOPHAGE) 500 MG tablet Take 1 tablet (500 mg total) by mouth 2 (two) times daily with a meal. 60 tablet 2  . Nutritional Supplements (GLUCERNA ADVANCE SHAKE) LIQD Take supplement daily in place of 1 meal. 30 Bottle 2  . predniSONE (DELTASONE) 10 MG tablet Take 5 mg by mouth daily with breakfast.     . spironolactone (ALDACTONE) 25 MG tablet Take 0.5 tablets (12.5 mg total) by mouth daily. 15 tablet 3   No current facility-administered medications for this encounter.    Allergies  Allergen Reactions  . Sertraline Hcl Hives  . Tape Other (See Comments)    Burns skin      History   Social History  .  Marital Status: Single    Spouse Name: N/A  . Number of Children: 1  . Years of Education: N/A   Occupational History  . CNA    Social History Main Topics  . Smoking status: Former Smoker -- 0.25 packs/day for .5 years    Types: Cigarettes    Quit date: 07/25/1996  . Smokeless tobacco: Never Used  . Alcohol Use: No  . Drug Use: No  . Sexual Activity: Yes    Birth Control/ Protection: Implant, IUD     Comment: placed 04/2011   Other Topics Concern  . Not on file   Social History Narrative  Works at [employer].        Family History  Problem Relation Age of Onset  . Heart disease Mother     CHF (no details)  . Hypertension Mother   . Heart disease Father     Murmur  . Heart disease Sister 18     No details.  History of a pacemaker  . Lupus Mother     Danley Danker Vitals:   12/26/14 1005  BP: 108/68  Pulse: 89  Weight: 203 lb 8 oz (92.307 kg)  SpO2: 98%    PHYSICAL EXAM: General:  Obese young woman wearing O2. No respiratory difficulty HEENT: normal Neck: supple. JVP ~8-9 Carotids 2+ bilat; no bruits. No lymphadenopathy or thryomegaly appreciated. Cor: PMI nonpalpable. Regular. No rubs, gallops or murmurs. Lungs: clear Abdomen: obese soft, nontender, nondistended. No hepatosplenomegaly. No bruits or masses. Good bowel sounds. Extremities: no cyanosis, clubbing, rash, no edema Neuro: alert & oriented x 3, cranial nerves grossly intact. moves all 4 extremities w/o difficulty. Affect pleasant.  ASSESSMENT & PLAN: 1) Chronic systolic HF: 12/7288 EF 21-11% on last echo.  Nonischemic cardiomyopathy.  St Jude CRT-P device.  Had [place] in January with low filling pressures and adequate output.  NYHA class II-III symptoms.   Volume status looks ok. Continue lasix 20 mg daily . Can stop lasix if weight drops 5 pounds. We provided a scale for her today and chart to daily weights.  - Continue bisoprolol 2.5 mg daily. Will not titrate with fatigue.  - Continue losartan to 25 mg bid.    - Increase  spironolactone 25 mg daily.   - Repeat echo in bext visit.  If it remains < 35%, would consider upgrading to defibrillator. - She understands she needs to prevent pregnanany.  2) Chronic respiratory failure with diffuse lung infiltrates of unknown etiology:  Lung infiltrates seem out of proportion to volume status and there is concern for concomitant parenchymal lung disease or connective tissue disorder. PFTs showed a severe restrictive defect concerning for an interstitial process.  Followed closely by Dr Makayla Vasquez and prednisone has been cut back.   3) Congenital AVN block s/p pacer 4) Diabetes  Follow up in 4 weeks with an ECHO. Check BMET in 10 days.   CLEGG,AMY,NP-C 10:24 AM

## 2014-12-26 NOTE — Patient Instructions (Addendum)
INCREASE Spironolactone to 25 mg daily  Your physician recommends that you schedule a follow-up appointment in: 4 weeks with echocardiogram  Labs needed in 10 days  IF YOUR WEIGHT DROPS 5 LB OR MORE OVER NIGHT HOLD FUROSEMIDE THAT DAY  Do the following things EVERYDAY: 1) Weigh yourself in the morning before breakfast. Write it down and keep it in a log. 2) Take your medicines as prescribed 3) Eat low salt foods-Limit salt (sodium) to 2000 mg per day.  4) Stay as active as you can everyday 5) Limit all fluids for the day to less than 2 liters 6)

## 2015-01-05 ENCOUNTER — Other Ambulatory Visit (HOSPITAL_COMMUNITY): Payer: Self-pay | Admitting: *Deleted

## 2015-01-05 MED ORDER — BISOPROLOL FUMARATE 5 MG PO TABS: 2.5000 mg | ORAL_TABLET | Freq: Every day | ORAL | Status: DC

## 2015-01-10 ENCOUNTER — Ambulatory Visit (HOSPITAL_COMMUNITY)
Admission: RE | Admit: 2015-01-10 | Discharge: 2015-01-10 | Disposition: A | Discharge status: Home / Self Care | Payer: Medicare Other | Source: Ambulatory Visit | Attending: Adult Health | Admitting: Adult Health

## 2015-01-10 DIAGNOSIS — I5022 Chronic systolic (congestive) heart failure: Secondary | ICD-10-CM | POA: Diagnosis not present

## 2015-01-10 DIAGNOSIS — I509 Heart failure, unspecified: Secondary | ICD-10-CM | POA: Insufficient documentation

## 2015-01-10 LAB — BASIC METABOLIC PANEL
Anion gap: 11 (ref 5–15)
BUN: 13 mg/dL (ref 6–23)
CO2: 23 mmol/L (ref 19–32)
Calcium: 9.1 mg/dL (ref 8.4–10.5)
Chloride: 100 mmol/L (ref 96–112)
Creatinine, Ser: 1 mg/dL (ref 0.50–1.10)
GFR calc Af Amer: 84 mL/min — ABNORMAL LOW (ref >90)
GFR calc non Af Amer: 73 mL/min — ABNORMAL LOW (ref >90)
GLUCOSE: 416 mg/dL — AB (ref 70–99)
Potassium: 3.7 mmol/L (ref 3.5–5.1)
Sodium: 134 mmol/L — ABNORMAL LOW (ref 135–145)

## 2015-01-19 ENCOUNTER — Encounter: Payer: Self-pay | Admitting: Family Medicine

## 2015-01-19 ENCOUNTER — Ambulatory Visit (INDEPENDENT_AMBULATORY_CARE_PROVIDER_SITE_OTHER): Payer: Medicare Other | Admitting: Family Medicine

## 2015-01-19 ENCOUNTER — Other Ambulatory Visit (HOSPITAL_COMMUNITY)
Admission: RE | Admit: 2015-01-19 | Discharge: 2015-01-19 | Disposition: A | Discharge status: Home / Self Care | Payer: Medicare Other | Source: Ambulatory Visit | Attending: Family Medicine | Admitting: Family Medicine

## 2015-01-19 VITALS — BP 131/82 | HR 77 | Temp 98.3°F | Ht 65.0 in | Wt 200.0 lb

## 2015-01-19 DIAGNOSIS — N760 Acute vaginitis: Secondary | ICD-10-CM

## 2015-01-19 DIAGNOSIS — Z1151 Encounter for screening for human papillomavirus (HPV): Secondary | ICD-10-CM | POA: Insufficient documentation

## 2015-01-19 DIAGNOSIS — Z124 Encounter for screening for malignant neoplasm of cervix: Secondary | ICD-10-CM

## 2015-01-19 DIAGNOSIS — B3731 Acute candidiasis of vulva and vagina: Secondary | ICD-10-CM

## 2015-01-19 DIAGNOSIS — A499 Bacterial infection, unspecified: Secondary | ICD-10-CM | POA: Diagnosis not present

## 2015-01-19 DIAGNOSIS — B373 Candidiasis of vulva and vagina: Secondary | ICD-10-CM

## 2015-01-19 DIAGNOSIS — L298 Other pruritus: Secondary | ICD-10-CM | POA: Diagnosis not present

## 2015-01-19 DIAGNOSIS — B9689 Other specified bacterial agents as the cause of diseases classified elsewhere: Secondary | ICD-10-CM

## 2015-01-19 DIAGNOSIS — N898 Other specified noninflammatory disorders of vagina: Secondary | ICD-10-CM

## 2015-01-19 LAB — POCT WET PREP (WET MOUNT): Clue Cells Wet Prep Whiff POC: POSITIVE

## 2015-01-19 MED ORDER — METRONIDAZOLE 500 MG PO TABS: 500.0000 mg | ORAL_TABLET | Freq: Two times a day (BID) | ORAL | Status: DC

## 2015-01-19 MED ORDER — FLUCONAZOLE 150 MG PO TABS
150.0000 mg | ORAL_TABLET | Freq: Once | ORAL | Status: DC
Start: 2015-01-19 — End: 2015-02-09

## 2015-01-19 NOTE — Assessment & Plan Note (Signed)
Chronic vaginitis symptoms x 2-3 months following recent antibiotic course, history and exam consistent with vaginal candidiasis and external candidal rash. Complicated by DM2. - wet prep - consistent with BV (mod clue cells, whiff positive, no yeast or trich - note significant blood within sample)  Plan: 1. Diflucan 150mg  PO #2, repeat dose on Day 3 - to treat external and presumed internal yeast vaginitis 2. Metronidazole 500mg  BID x 7 days - no alcohol for BV 3. If symptoms do not resolve or return, would consider prolonged Diflucan course - weekly x 2-4 weeks

## 2015-01-19 NOTE — Patient Instructions (Signed)
Dear Alvy Beal, Thank you for coming in to clinic today. It was good to see you!  1. We will treat with Diflucan 150mg  - for yeast infection (internal and external rash) - take one today and take 2nd dose in in 3 days. This should resolve both problems, may take up to 1 week. If still have symptoms, please call back and we can extend to do 1 tablet weekly for few weeks. Otherwise, would need to re-check you in about 1 month 2. If your Wet Prep shows anything else, I will call you with results, otherwise this is the treatment. 3. Pap smear collected - will call or mail letter with results 4. Recommend topical moisturizer  Please schedule a follow-up appointment with Dr. in 1 month if symptoms persistent. Due for next Diabetes follow-up in 1-2 months.  If you have any other questions or concerns, please feel free to call the clinic to contact me. You may also schedule an earlier appointment if necessary.  However, if your symptoms get significantly worse, please go to the Emergency Department to seek immediate medical attention.  Althea Charon, DO [facility]

## 2015-01-19 NOTE — Progress Notes (Signed)
   Subjective:    Patient ID: Makayla Vasquez, female    DOB: 1981/02/10, 34 y.o.   MRN: [id number]  Patient presents for a same day appointment.  HPI  VAGINAL ITCHING / IRRITATION: - Reports persistent vaginal itching and irritation gradually worsening over past 2-3 months. Stated symptoms started after antibiotic treatment in 10/2014 for Lip Abscess. She tried conservative home therapies and then OTC Monostat without any relief, and now follows up for evaluation. Admits to vaginal odor and some occasional discharge. Has been scratching and says skin on her labia is "irritated" with rash. - LMP 01/05/15 - Denies any history of STDs, missed periods, pelvic or abd pain, fevers, spreading rash  HM: - Last pap smear 01/01/2012 (Negative, no HPV testing) - Due for pap smear and HPV co-testing  I have reviewed and updated the following as appropriate: allergies and current medications  Social Hx: - No smoking  Review of Systems  See above HPI    Objective:   Physical Exam  BP 131/82 mmHg  Pulse 77  Temp(Src) 98.3 F (36.8 C) (Oral)  Ht 5\' 5"  (1.651 m)  Wt 200 lb (90.719 kg)  BMI 33.28 kg/m2  LMP 01/05/2015  Gen - well-appearing, chronic supplemental O2 via [place], NAD HEENT - MMM Skin - warm, dry, no rashes on body Pelvic Exam - External genitalia with rash on bilateral labia within skin folds, erythematous base with superficial dry flaking skin, no bleeding, fluctuance, swelling, or discharge. No evidence of infection, consistent with candidiasis. Vaginal canal without lesions. Normal appearing cervix, with mild blood in vaginal vault without active bleeding. Physiologic discharge on exam. Bimanual exam without masses or cervical motion tenderness.  Exam chaperoned by 01/07/2015, CMA    Assessment & Plan:   See specific A&P problem list for details.

## 2015-01-22 LAB — CYTOLOGY - PAP

## 2015-01-23 ENCOUNTER — Encounter (HOSPITAL_COMMUNITY): Payer: Medicare Other

## 2015-01-23 ENCOUNTER — Encounter: Payer: Self-pay | Admitting: Family Medicine

## 2015-01-23 ENCOUNTER — Ambulatory Visit (HOSPITAL_COMMUNITY): Payer: Medicare Other

## 2015-01-29 ENCOUNTER — Ambulatory Visit: Payer: Medicare Other | Admitting: Medical

## 2015-01-30 DIAGNOSIS — L218 Other seborrheic dermatitis: Secondary | ICD-10-CM | POA: Diagnosis not present

## 2015-02-09 ENCOUNTER — Ambulatory Visit (INDEPENDENT_AMBULATORY_CARE_PROVIDER_SITE_OTHER): Payer: Medicare Other | Admitting: Internal Medicine

## 2015-02-09 ENCOUNTER — Ambulatory Visit (INDEPENDENT_AMBULATORY_CARE_PROVIDER_SITE_OTHER)
Admission: RE | Admit: 2015-02-09 | Discharge: 2015-02-09 | Disposition: A | Discharge status: Home / Self Care | Payer: Medicare Other | Source: Ambulatory Visit | Attending: Internal Medicine | Admitting: Internal Medicine

## 2015-02-09 ENCOUNTER — Encounter: Payer: Self-pay | Admitting: Internal Medicine

## 2015-02-09 ENCOUNTER — Telehealth: Payer: Self-pay | Admitting: Family Medicine

## 2015-02-09 ENCOUNTER — Ambulatory Visit (HOSPITAL_COMMUNITY): Payer: Medicare Other

## 2015-02-09 ENCOUNTER — Encounter: Payer: Self-pay | Admitting: Family Medicine

## 2015-02-09 ENCOUNTER — Encounter (HOSPITAL_COMMUNITY): Payer: Medicare Other

## 2015-02-09 VITALS — BP 128/72 | HR 78 | Ht 65.0 in | Wt 198.2 lb

## 2015-02-09 DIAGNOSIS — J45909 Unspecified asthma, uncomplicated: Secondary | ICD-10-CM

## 2015-02-09 DIAGNOSIS — Z0000 Encounter for general adult medical examination without abnormal findings: Secondary | ICD-10-CM | POA: Diagnosis not present

## 2015-02-09 DIAGNOSIS — R918 Other nonspecific abnormal finding of lung field: Secondary | ICD-10-CM | POA: Diagnosis not present

## 2015-02-09 DIAGNOSIS — J9611 Chronic respiratory failure with hypoxia: Secondary | ICD-10-CM

## 2015-02-09 DIAGNOSIS — J849 Interstitial pulmonary disease, unspecified: Secondary | ICD-10-CM

## 2015-02-09 NOTE — Assessment & Plan Note (Addendum)
Has recently been using oxygen at 2 L. She maintained oxygen adequately walking on room air. Plan-okay to change oxygen to use just at night and as needed  during the daytime. Could try to reduce to 1L/ min if stable.

## 2015-02-09 NOTE — Patient Instructions (Signed)
Order- Oximetry at rest and walk test on room air   Dx asthma with bronchitis  Order- CXR   Dx asthma with bronchitis  Reduce prednisone to 5 mg every other day   Please call as needed

## 2015-02-09 NOTE — Telephone Encounter (Signed)
Needs up to date letter saying she is approved for theraputic dog

## 2015-02-09 NOTE — Telephone Encounter (Signed)
Called patient, discussed letter request with patient, Makayla Vasquez. She states that she needs a letter from physician stating that her current therapy dog is medically necessary, she did not require specific diagnoses listed in the letter. She would be able to pick up the letter on Monday 02/12/15. I have completed the letter in Epic, and will plan to leave it in the [facility] for her to pick-up on Monday, however if she attempts to pick it up prior to me placing it in the pick-up box, please print off a copy of the letter to give her.  Saralyn Pilar, DO [facility], PGY-2

## 2015-02-09 NOTE — Assessment & Plan Note (Signed)
Post inflammatory scarring appears to have stabilized. Comparing with chest x-rays during the peak of her respiratory illness and prior, she is markedly improved but not clear. We will continue to follow at long intervals Plan-try reducing prednisone to 5 mg every other day

## 2015-02-09 NOTE — Progress Notes (Signed)
02/23/14- 36 yoF former smoker Referred by Dr Bridgett Larsson for allergy consult.  C/o itchy, watery eyes, sinus congestion, headaches, prodcough with green mucous X several years. Mother and son are here She had been going to allergist at [facility] for allergic rhinitis, itching eyes and chest tightness for years as an adult. Typically worse in spring and summer, with occasional colds and with exposure to house dust. Daily wheeze.She tried allergy shots for 2 months, 2 years ago but had strong local reactions. 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 Mother has hx allergies. CXR 5/ 8 /14 IMPRESSION:  No active disease. No significant change.  Original Report Authenticated By: Lahoma Crocker, M.D.  04/06/14- 67 yoF former smoker Referred by Dr Bridgett Larsson for allergy consult.  C/o itchy, watery eyes, sinus congestion, headaches, prodcough with green mucous X several years. Mother and son are here FOLLOWS FOR: Allergies have been doing well until outdoors with trees,etc. Allergy profile 02/23/2014-negative with total IgE 22.9. She does give history of allergy shots in the past, stopped because of large local reactions. Past history suggests stronger allergic reactions when she was younger. We can retest her if there is a question She still reports some itching in her throat and cough sometimes green sputum  06/27/14 [facility] follow up  Returns for follow up from recent hospital stay.  Reports breathing is doing well today. Is suppose to be on O2 at 4lm however did not bring O2 b/c   portable tank at home is malfunctioned. DME contacted and at home to fix system. Advised on importance of O2 use.  Says were very low off Oxygen - sats 54%-83% on room air unpon entering the exam room. Sats increased 90% on 4l/m  Admitted last week for significant hypoxia   Patient presented with hypoxemia of unknown etiology, requiring 4L of oxygen. Notably, she was hospitalized from 8/11-8/14 with a  similar presentation and was thought to have a viral pneumonitis complicated by atelectasis and mild edema vs. an autoimmune process and was discharged home on a course of levaquin and prednisone.  Her CT scan from 8/28 showed bilateral progressive lower peribronchial infiltrates with groundglass changes and nodular patter consistent with acute pneumonitis of unclear etiology. During her last admission, she was found to have a positive ANA, mildly elevated ESR, non-reactive HIV, and negative PPD. Also during her last hospitalization, cardiology saw her for a repeat echo that showed a decrease in EF (25-30%, from baseline 30-25% in 2013).  Viral panel was positive for rhinovirus previous admission but neg for this admit .  ESR was elevated 62 , decreased today at 55.  Pulmonology was consulted and did a BAL on 9/1 that showed 60% macrophages and 30% PMNs. Thus far, her AFB smear, fungal smear, legionella Ab, pneumocystis smear, ACE, and sputum culture are negative. Fungal neg , bal cx neg w/ non path oral flora , legionella neg  Has family hx of Lupus.  Anti smith ab neg and anti dsDNA neg  HSP panel was neg.  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 Status: Edited Related Problem: ILD (interstitial lung disease)    #1 bilateral progressive lower lobe peribronchial infiltrates with groundglass changes and nodular pattern consistent with acute pneumonitis of unclear etiology.  Note previous serologic studies show a speckled positive ANA 1-80 pattern and no evidence of  allergy factors or elevated IgE. This inflammatory pneumonitis is not yet discerned as to etiology therefore bronchoscopy would be indicated and will be pursued  Previous viral panel + for rhinovirus  BAL cx neg to date.  HSP neg .  Will cont on steroid challenge with close  follow up  Case discussed with Dr. Young  Clinically she appears stable , will have her back in office with follow up cxr in 1 week as cxr w/ worse aeration today  Plan  Taper Prednisone 40mg daily and hold at this dose.  Continue on Oxygen 4l/m continuous flow 24/7 .  Labs and chest xray today .  Follow up Dr. Young In 1 weeks and As needed  Please contact office for sooner follow up if symptoms do not improve or worsen or seek emergency care    07/06/14-32 yoF former smoker originaly referred for allergy evaluation, rhinitis, asthmatic bronchitis, then hosp for ?ALI with lower zone pneumonitis/ BAL and special studies NEG,. Acute on Chronic respiratory failure. Pacemaker for AV block  O2 4L/ Advanced FOLLOWS FOR: Pt c/o DOE, prod cough with clear mucus and chest tightness when coughing. Pt last saw TP on 06/27/2014 for HFU .   08/07/14- 33 yoF former smoker originaly referred for allergy evaluation, rhinitis, asthmatic bronchitis, then hosp for ?ALI with lower zone pneumonitis/ BAL and special studies NEG,. Acute on Chronic respiratory failure. Pacemaker for AV block  O2 4L/ Advanced      Husband and son are here Continues oxygen but feels comfortable at rest taking it off for short intervals. As instructed, prednisone is now down to 20 mg daily for the last 2 weeks. She feels stable, not worse. Coughs a lot, clear mucus. Denies fever, blood, purulent sputum, edema, adenopathy. Pending cardiac ablation seeking better control of heart rate. CXR 07/06/14 IMPRESSION:  1. No change in aeration to the lungs compared with the previous  exam.  Electronically Signed  By: Taylor Stroud M.D.  On: 07/06/2014 15:56  10/08/14-  33 yoF former smoker originaly referred for allergy evaluation, rhinitis, asthmatic bronchitis, then hosp for ?ALI with lower zone pneumonitis/ BAL and special studies NEG,. Acute on Chronic respiratory failure. Pacemaker for AV block  O2 4L/ Advanced. Has continued prednisone 20 mg  daily. FOLLOW FOR:  hypoxemia; breathing doing fine, wants to get off oxygen; no complaints  Arrived 78% on 2L, 90% on 4L..  Husband and son here. Cough with clear phlegm. Feels better with no acute events. CXR 09/18/14  IMPRESSION: Bibasilar pulmonary infiltrates, improved from 08/07/2014. Electronically Signed  By: Thomas Register  On: 09/18/2014 13:35   12/11/14- 33 yoF former smoker originaly referred for allergy evaluation, rhinitis, asthmatic bronchitis, then hosp for ?ALI with lower zone pneumonitis/ BAL and special studies NEG,. Acute on Chronic respiratory failure/ Hypoxemia,. Pacemaker for AV block  O2 4L/ Advanced. Has continued prednisone 10 mg daily. FOLLOWS FOR: Pt states her breathing is doing well overall and continues to wear O2 through [facility]. CT max fac 10/30/14 IMPRESSION: 32 x 13 x 10 mm abscess in the upper lip area centrally. Inflamed/hyperplastic neck nodes bilaterally. No significant dental disease or sinus disease. Electronically Signed  By: Mark Gallerani M.D.  On: 10/30/2014 14:31  02/09/15- 33 yoF former smoker originaly referred for allergy evaluation, rhinitis, asthmatic bronchitis, then hosp for ?ALI with lower zone pneumonitis/ BAL and special studies NEG,. Acute on Chronic respiratory failure/ Hypoxemia,.DM Pacemaker for AV block  O2 2L/ Advanced. Has continued prednisone 10 mg daily. FOLLOWS FOR:   Pt states she is doing well; Wears O2 as needed during the day and at night. DME -[facility]. Husband and son here Continues prednisone 10 mg daily She says she is doing "great" with little cough and no acute events. Her oxygen at 2 L/m prevents her return to work but she hopes to get back quickly. Walk test on room air: 44/22/16: 98%, 92%, 91%, 90% at rest on room air. Pulse rate ranged 74-107. CXR 12/11/14-- I reviewed images. Coarse prominent lung markings in lower third bilaterally. Significantly improved from acute picture. IMPRESSION: 1. Persistent lung base  opacity similar to prior studies. The etiology of this is unclear, but given the lack of symptoms, it is likely chronic. 2. No convincing acute cardiopulmonary disease. Electronically Signed  By: Lajean Manes M.D.  On: 12/11/2014 13:36  ROS-see HPI Constitutional:   No-   weight loss, night sweats, fevers, chills, fatigue, lassitude. HEENT:   , difficulty swallowing, tooth/dental problems, sore throat,       sneezing, itching, ear ache, nasal congestion, post nasal drip,  CV:  No-   chest pain, orthopnea, PND, swelling in lower extremities, anasarca,                                  dizziness, palpitations Resp: No-   shortness of breath with exertion or at rest.              +productive cough,  No non-productive cough,  No- coughing up of blood.               No- wheezing.   Skin: No-   rash or lesions. GI:   heartburn, indigestion, abdominal pain, nausea, vomiting,  GU:  MS:  No-   joint pain or swelling.   Neuro-     nothing unusual Psych:  No- change in mood or affect. No depression or anxiety.  No memory loss.  OBJ- Physical Exam   O2 2 L 96% General- Alert, Oriented, Affect-appropriate, Distress- none acute,  Skin- +acanthotic hyperpigmentation around neck Lymphadenopathy- none Head- atraumatic            Eyes- Gross vision intact, PERRLA, conjunctivae and secretions clear            Ears- Hearing, canals-normal            Nose-  no-Septal dev, mucus, polyps, erosion, perforation             Throat- Mallampati III , mucosa clear , drainage- none, tonsils- atrophic Neck- flexible , trachea midline, no stridor , thyroid nl, carotid no bruit Chest - symmetrical excursion , unlabored           Heart/CV- RRR , no murmur , no gallop  , no rub, nl s1 s2                           - JVD- none , edema- none, stasis changes- none, varices- none           Lung- clear to P&A diminished in bases, wheeze- none, cough+ light , dullness-none, rub- none.           Chest wall-  Abd-  Br/  Gen/ Rectal- Not done, not indicated Extrem- cyanosis- none, clubbing, none, atrophy- none, strength- nl Neuro- grossly intact to observation

## 2015-02-10 ENCOUNTER — Other Ambulatory Visit: Payer: Self-pay | Admitting: Internal Medicine

## 2015-02-12 ENCOUNTER — Ambulatory Visit (INDEPENDENT_AMBULATORY_CARE_PROVIDER_SITE_OTHER): Payer: Medicare Other | Admitting: *Deleted

## 2015-02-12 ENCOUNTER — Telehealth: Payer: Self-pay | Admitting: Internal Medicine

## 2015-02-12 ENCOUNTER — Other Ambulatory Visit: Payer: Self-pay

## 2015-02-12 DIAGNOSIS — I5022 Chronic systolic (congestive) heart failure: Secondary | ICD-10-CM

## 2015-02-12 DIAGNOSIS — Z111 Encounter for screening for respiratory tuberculosis: Secondary | ICD-10-CM | POA: Diagnosis present

## 2015-02-12 MED ORDER — POTASSIUM CHLORIDE CRYS ER 20 MEQ PO TBCR: EXTENDED_RELEASE_TABLET | ORAL | Status: DC

## 2015-02-12 NOTE — Telephone Encounter (Signed)
Pt states that when she was here last that [employer] released her to work on the weekends only. She needs a note for her employer stating this.  CY - please advise. Thanks.

## 2015-02-12 NOTE — Progress Notes (Signed)
   PPD placed Left Forearm.  Pt to return 02/14/15 for reading.  Pt tolerated intradermal injection. Clovis Pu, RN

## 2015-02-12 NOTE — Telephone Encounter (Signed)
Ok - please draft note allowing her back for weekends only until further notice as she continues under our care. Thanks

## 2015-02-12 NOTE — Telephone Encounter (Signed)
Note signed and faxed to Jane at [phone].  Patient notified. Nothing further needed.

## 2015-02-13 ENCOUNTER — Ambulatory Visit (HOSPITAL_COMMUNITY)
Admission: RE | Admit: 2015-02-13 | Discharge: 2015-02-13 | Disposition: A | Discharge status: Home / Self Care | Payer: Medicare Other | Source: Ambulatory Visit | Attending: Internal Medicine | Admitting: Internal Medicine

## 2015-02-13 ENCOUNTER — Ambulatory Visit (HOSPITAL_BASED_OUTPATIENT_CLINIC_OR_DEPARTMENT_OTHER)
Admission: RE | Admit: 2015-02-13 | Discharge: 2015-02-13 | Disposition: A | Discharge status: Home / Self Care | Payer: Medicare Other | Source: Ambulatory Visit | Attending: Internal Medicine | Admitting: Internal Medicine

## 2015-02-13 VITALS — BP 100/68 | HR 84 | Wt 195.8 lb

## 2015-02-13 DIAGNOSIS — I509 Heart failure, unspecified: Secondary | ICD-10-CM | POA: Diagnosis not present

## 2015-02-13 DIAGNOSIS — I5022 Chronic systolic (congestive) heart failure: Secondary | ICD-10-CM | POA: Diagnosis not present

## 2015-02-13 NOTE — Patient Instructions (Signed)
Doing great!  Follow up 4 months.  Do the following things EVERYDAY: 1) Weigh yourself in the morning before breakfast. Write it down and keep it in a log. 2) Take your medicines as prescribed 3) Eat low salt foods-Limit salt (sodium) to 2000 mg per day.  4) Stay as active as you can everyday 5) Limit all fluids for the day to less than 2 liters  

## 2015-02-13 NOTE — Progress Notes (Signed)
  Echocardiogram 2D Echocardiogram has been performed.  Leta Jungling M 02/13/2015, 2:59 PM

## 2015-02-13 NOTE — Progress Notes (Signed)
Patient ID: Makayla Vasquez, female   DOB: 1981-04-04, 34 y.o.   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 Lovena Le for HF evaluation.   She has a h/o congenital HB and underwent first PPM at age 89. In 2011 had ECHO with EF 35-40%. LV dysfunction felt to be due to RV pacing so upgraded to CRT-P. Recent echo in 10/15 with EF 25%. She has had worsening HF symptoms and referred for evaluation for advanced HF therapies. She also has concomitant lung disease and this has limited b-blocker dosing.  She follows with Dr. Annamaria Boots for her lung disease. She is a former smoker.  Admitted in 8/15 and 9/15 with worsening respiratory status and severe hypoxia. Had extensive w/u. Thought to have a viral pneumonitis complicated by atelectasis and mild edema vs. an autoimmune process. Discharged home on a course of levaquin and prednisone.   06/16/14 CT scan showed bilateral progressive lower peribronchial infiltrates with groundglass changes and nodular patter consistent with acute pneumonitis of unclear etiology.  06/20/14 BAL showed 60% macrophages and 30% PMNs. Her AFB smear, fungal smear, legionella Ab, pneumocystis smear, ACE, and sputum culture are negative. Her AFB, legionella, fungal, and BAL culture as well as her fungal Ab, hypersensitivity pneumonitis panel were all negative Serology: DsDNA, RF, ANCA, HSP all negative  She was offered a lung biopsy for further diagnosis versus empiric treatment with steroids. 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  Saw Dr. Annamaria Boots on 06/27/14. CXR with persistent infiltrates of unclear etiology. Prednisone weaned down now taking 95m  one day and 13mthe next.  EP study 10/15 for persistent tachycardia and found to have sinus tach.   She returns for follow up. Saw Dr YoAnnamaria Bootsast week and she was released to go back to work. Prednisone has been cut back to every other day. Only wearing oxygen at night.Denies SOB/PND/Orthopnea. Able to walk around the grocery store without difficulty. Taking all medications. Weight at home 194-197 pounds.   Echo today with EF 40-45%. Pacer lead seems to be dislodged.   PFTs (12/15) showed a severe restrictive defect concerning for an interstitial process.   [place]/04/2015  RA = 2 RV = 27/1/5 PA = 29/7 (18) PCW = 4 Fick cardiac output/index = 4.5/2.2 PVR = 3.0 WU FA sat = 98% PA sat = 64%, 61%  Labs (10/15): TSH normal K 3.6, creatinine 1.1 Labs (10/31/2014) L k 3.9 Creatinine 1.06   Review of Systems: All systems reviewed and negative except as per HPI.   Past Medical History  Diagnosis Date  . Cardiac pacemaker     a. Since age [age] 1949b. Upgrade to BiV in 2013.  . Congenital complete AV block   . Obesity   . GERD (gastroesophageal reflux disease)   . Asymptomatic LV dysfunction     a. Echo in Dec 2011 with EF 35 to 40%. Felt to be due to paced rhythm. b. EF 25-30% in 07/2014.  . Seizures     as a child- from high fever  . Anxiety   . Bipolar affective disorder   . Depression     bipolar  . Carpal tunnel syndrome of  right wrist   . Asthma     seasonal allergies   . Arthritis     rheumatoid arthritis- mild, no rheumatology care   . Hypertension   . Pneumonitis     a. a/w hypoxia - inflammatory - large workup 07/2014.  [name] Sinus tachycardia   . Diabetes mellitus without complication   . Presence of permanent cardiac pacemaker   . CHF (congestive heart failure)     Current Outpatient Prescriptions  Medication Sig Dispense Refill  . ACCU-CHEK SOFTCLIX LANCETS lancets Test blood sugar 3 times daily. 100 each 12  . albuterol (PROVENTIL HFA;VENTOLIN HFA) 108 (90  BASE) MCG/ACT inhaler Inhale 2 puffs into the lungs every 4 (four) hours as needed for wheezing or shortness of breath. 1 each 1  . bisoprolol (ZEBETA) 5 MG tablet Take 0.5 tablets (2.5 mg total) by mouth daily. 15 tablet 3  . Blood Glucose Monitoring Suppl (ONE TOUCH ULTRA 2) W/DEVICE KIT Check blood sugar daily before breakfast 1 each 0  . budesonide-formoterol (SYMBICORT) 80-4.5 MCG/ACT inhaler Inhale 2 puffs into the lungs 2 (two) times daily as needed (for shortness of breath).     [name] etonogestrel (NEXPLANON) 68 MG IMPL implant 1 each by Subdermal route once.    . furosemide (LASIX) 20 MG tablet Take 1 tablet (20 mg total) by mouth daily. For shortness of breath and/or leg swelling. 30 tablet 5  . glucose blood (ONE TOUCH ULTRA TEST) test strip Check blood sugar daily before breakfast 100 each 0  . Insulin Glargine (LANTUS SOLOSTAR) 100 UNIT/ML Solostar Pen Inject 15-30 Units into the skin daily. 15 mL 2  . Insulin Pen Needle (INSUPEN PEN NEEDLES) 32G X 4 MM MISC Inject insulin via insulin pen 1-2 x daily 50 each 5  . losartan (COZAAR) 25 MG tablet Take 1 tablet (25 mg total) by mouth 2 (two) times daily. 60 tablet 3  . metFORMIN (GLUCOPHAGE) 500 MG tablet Take 1 tablet (500 mg total) by mouth 2 (two) times daily with a meal. 60 tablet 2  . Nutritional Supplements (GLUCERNA ADVANCE SHAKE) LIQD Take supplement daily in place of 1 meal. 30 Bottle 2  . potassium chloride SA (KLOR-CON M20) 20 MEQ tablet TAKE 40 MEQ TODAY (2 TABLETS) THEN TAKE 20 MEQ ON DAYS THAT LASIX IS TAKEN 30 tablet 6  . predniSONE (DELTASONE) 10 MG tablet Take 5 mg by mouth daily with breakfast.     . spironolactone (ALDACTONE) 25 MG tablet Take 1 tablet (25 mg total) by mouth daily. 30 tablet 3   No current facility-administered medications for this encounter.    Allergies  Allergen Reactions  . Sertraline Hcl Hives  . Tape Other (See Comments)    Burns skin      History   Social History  . Marital Status: Single     Spouse Name: N/A  . Number of Children: 1  . Years of Education: N/A   Occupational History  . CNA    Social History Main Topics  . Smoking status: Former Smoker -- 0.25 packs/day for .5 years    Types: Cigarettes    Quit date: 07/25/1996  . Smokeless tobacco: Never Used  . Alcohol Use: No  . Drug Use: No  . Sexual Activity: Yes    Birth Control/ Protection: Implant, IUD     Comment: placed 04/2011   Other Topics Concern  . Not on file   Social History Narrative   Works at [employer].  Family History  Problem Relation Age of Onset  . Heart disease Mother     CHF (no details)  . Hypertension Mother   . Heart disease Father     Murmur  . Heart disease Sister 5     No details.  History of a pacemaker  . Lupus Mother     Danley Danker Vitals:   02/13/15 1456  BP: 100/68  Pulse: 84  Weight: 195 lb 12.8 oz (88.814 kg)  SpO2: 95%    PHYSICAL EXAM: General:  Obese young woman wearing O2. No respiratory difficulty HEENT: normal Neck: supple. JVP ~6-7 Carotids 2+ bilat; no bruits. No lymphadenopathy or thryomegaly appreciated. Cor: PMI nonpalpable. Regular. No rubs, gallops or murmurs. Lungs: clear Abdomen: obese soft, nontender, nondistended. No hepatosplenomegaly. No bruits or masses. Good bowel sounds. Extremities: no cyanosis, clubbing, rash, no edema Neuro: alert & oriented x 3, cranial nerves grossly intact. moves all 4 extremities w/o difficulty. Affect pleasant.  ASSESSMENT & PLAN: 1) Chronic systolic HF: 0/1499 EF 69-24%. Echo today EF ~40-45% ?pacer lead dislodgement Nonischemic cardiomyopathy.  Medtronic device.  Had [place] in January with low filling pressures and adequate output.  NYHA class I-IIsymptoms.   Volume status looks ok. Continue lasix 20 mg daily . Can stop lasix if weight drops 5 pounds.  - Continue bisoprolol 2.5 mg daily. Will not titrate with fatigue.  - Continue losartan to 25 mg bid.   - Continue  spironolactone 25 mg daily.   - She  understands she needs to prevent pregnanany. Nexplanon in place RUE.   2) Chronic respiratory failure with diffuse lung infiltrates of unknown etiology:  Lung infiltrates and symptoms much improved with prednisone. Released by Dr Annamaria Boots to go back to work.    3) Congenital AVN block s/p pacer 4) Diabetes- Uncontrolled- Asked to follow up with PCP.  5) ?pacer lead dislodgement on echo. EP checking in Clinic. Will get CXR.   Follow up in  4 months  Vasquez,AMY,NP-C 3:13 PM  Patient seen and examined with Makayla Grinder, NP. We discussed all aspects of the encounter. I agree with the assessment and plan as stated above.   Much improved. I reviewed echo personally in clinic. EF recovering. Now ~ 40-45%. BP too low to titrate meds further. ? Pacer lead dislodgement. EP checking thresholds. Will get CXR. Lung infiltrates and symptoms much improved with prednisone.   Makayla Bensimhon,MD 3:40 PM  Addendum: Formal ech read shows EF 45-50%. CXR and pacer parameters reviewed with EP nurse practitioner and appears stable.   Makayla Vasquez 4:37 PM

## 2015-02-14 ENCOUNTER — Ambulatory Visit (INDEPENDENT_AMBULATORY_CARE_PROVIDER_SITE_OTHER): Payer: Medicare Other | Admitting: *Deleted

## 2015-02-14 ENCOUNTER — Encounter: Payer: Self-pay | Admitting: *Deleted

## 2015-02-14 DIAGNOSIS — Z111 Encounter for screening for respiratory tuberculosis: Secondary | ICD-10-CM

## 2015-02-14 DIAGNOSIS — Z7689 Persons encountering health services in other specified circumstances: Secondary | ICD-10-CM

## 2015-02-14 LAB — TB SKIN TEST
Induration: 0 mm
TB SKIN TEST: NEGATIVE

## 2015-02-14 NOTE — Progress Notes (Signed)
   PPD Reading Note PPD read and results entered in EpicCare. Result: 0 mm induration. Interpretation: Negative If test not read within 48-72 hours of initial placement, patient advised to repeat in other arm 1-3 weeks after this test. Allergic reaction: no  Martin, Tamika L, RN  

## 2015-02-16 ENCOUNTER — Encounter: Payer: Self-pay | Admitting: Family Medicine

## 2015-02-16 ENCOUNTER — Ambulatory Visit (INDEPENDENT_AMBULATORY_CARE_PROVIDER_SITE_OTHER): Payer: Medicare Other | Admitting: Family Medicine

## 2015-02-16 VITALS — BP 96/66 | HR 79 | Temp 98.1°F | Ht 65.0 in | Wt 194.0 lb

## 2015-02-16 DIAGNOSIS — Z23 Encounter for immunization: Secondary | ICD-10-CM | POA: Diagnosis not present

## 2015-02-16 DIAGNOSIS — J849 Interstitial pulmonary disease, unspecified: Secondary | ICD-10-CM

## 2015-02-16 DIAGNOSIS — E1169 Type 2 diabetes mellitus with other specified complication: Secondary | ICD-10-CM

## 2015-02-16 DIAGNOSIS — E119 Type 2 diabetes mellitus without complications: Secondary | ICD-10-CM

## 2015-02-16 DIAGNOSIS — E669 Obesity, unspecified: Secondary | ICD-10-CM | POA: Diagnosis not present

## 2015-02-16 LAB — POCT GLYCOSYLATED HEMOGLOBIN (HGB A1C)

## 2015-02-16 MED ORDER — METFORMIN HCL 1000 MG PO TABS: 1000.0000 mg | ORAL_TABLET | Freq: Two times a day (BID) | ORAL | Status: DC

## 2015-02-16 MED ORDER — INSULIN GLARGINE 100 UNIT/ML SOLOSTAR PEN: PEN_INJECTOR | SUBCUTANEOUS | Status: DC

## 2015-02-16 NOTE — Patient Instructions (Addendum)
Dear Makayla Vasquez, Thank you for coming in to clinic today. It was good to see you again!  1. For your Diabetes and Blood Sugar - Uncontrolled blood sugar right now, A1c >15.0 - please write down all blood sugars, check 2-3 times daily - fasting in morning and evening 2 hour after dinner. - Blood Sugar Plan: - Increase LANTUS to 35 units daily before 10 AM - If your fasting CBG (in AM before food/drink) is >250, increase by 5 units - ONLY INCREASE EVERY 3 to 5 days - If you get to 50 units Lantus, then stay at that level. Call to schedule follow-up at this point  Increase Metformin to 1000mg  twice daily (you may finish your 500mg  tabs, take 2 at a time twice daily, start with only 2 in morning, and 1 in evening for 1 week, then increase to 1000mg  twice daily)  - Get the Glucerna meal substitute shake - drink one for lunch daily (and have a snack if needed between meals) - Try to eat 2 regular meals EVERY day - to get on a regular schedule (breakfast and dinner)  A1c is >15.0  Please schedule a follow-up appointment with Dr. in 1-2 weeks for follow-up Diabetes.  If you have any other questions or concerns, please feel free to call the clinic to contact me. You may also schedule an earlier appointment if necessary.  However, if your symptoms get significantly worse, please go to the Emergency Department to seek immediate medical attention.  , DO [facility]

## 2015-02-16 NOTE — Progress Notes (Signed)
   Subjective:    Patient ID: Makayla Vasquez, female    DOB: 22-Dec-1980, 34 y.o.   MRN: [id number]  HPI  CHRONIC DM, Type 2, steroid induced - History of chronic prednisone taper from 40mg  daily down to current 5mg  qod (over past >6 months) Reports - Significantly elevated blood sugars since last visit. Recently followed up with Cardiology ([facility]) advised her to follow-up on DM CBGs: Avg 300, Low 280s, High 586. Checks CBGs 1x daily (AM fasting), occasional re-check later in day if feeling bad Meds: Lantus 24u daily (9-10am), Metformin 500mg  BID, Cinnamon pill Reports good compliance. Tolerating well w/o side-effects Currently on ARB Lifestyle: Diet (improving DM diet, tries to reduce carbs, drinking plenty of water) / Exercise (not regular) Admits polydipsia, polyuria Denies hypoglycemia, visual changes, numbness or tingling.  PMH: - Followed by Pulmonology (Dr. 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 - Followed by Cardiology ([facility]) (Dr. ) - Chronic Systolic HF / Non-ischemic Cardiomyopathy, Congenital AVN Block s/p pacer. Last ECHO 02/13/15 EF improved from 20% to 40-45%. Last RHC January (low filling pressures and adequate output). NYHA class I-II symptoms. Tapered to Lasix 20mg  daily.   I have reviewed and updated the following as appropriate: allergies and current medications  Social Hx: - Former smoker  Review of Systems  See above HPI    Objective:   Physical Exam  BP 96/66 mmHg  Pulse 79  Temp(Src) 98.1 F (36.7 C) (Oral)  Ht 5\' 5"  (1.651 m)  Wt 194 lb (87.998 kg)  BMI 32.28 kg/m2  LMP 01/03/2015  Not on any supplemental oxygen today.  Gen - obese, well-appearing, cooperative, NAD HEENT - MMM Heart - RRR, no murmurs heard Abd - soft, NTND, active +BS Skin - warm, dry, no rashes Neuro - awake,  alert     Assessment & Plan:   See specific A&P problem list for details.

## 2015-02-17 NOTE — Assessment & Plan Note (Addendum)
Uncontrolled. Dramatically worsened control without adherence to Lantus titration, suspected related to chronic prednisone, now tapered to 5mg  qod. Previously out of control with h/o lip abscess (resolved). - A1c >15 (02/15/15), last A1c 9.3 (10/2014) - No complications or hypoglycemia  Plan: 1. Increase Lantus dose from 24u daily to 35u daily in AM. Instructions to titrate up by 5u every 3-5 days if fasting AM CBG >200 consistently. Anticipate to titrate up to about 50 then stop. Need to check CBG 2-3x daily, bring written logs or glucometer. 2. Increased Metformin to 1000mg  BID (from 500mg  BID), now with improved CHF 3. Emphasized importance on improved DM diet and exercise (however limited by recent cardio/pulm disease) 4. Referral to Ophthalmology for annual DM eye exam 5. RTC 1-2 weeks follow-up CBG logs and Lantus titration, consider referral to Pharmacy Clinic for DM management

## 2015-03-05 ENCOUNTER — Encounter: Payer: Self-pay | Admitting: Family Medicine

## 2015-03-05 ENCOUNTER — Ambulatory Visit (INDEPENDENT_AMBULATORY_CARE_PROVIDER_SITE_OTHER): Payer: Medicare Other | Admitting: Family Medicine

## 2015-03-05 VITALS — BP 102/72 | HR 80 | Temp 98.4°F | Ht 65.0 in | Wt 194.0 lb

## 2015-03-05 DIAGNOSIS — E669 Obesity, unspecified: Secondary | ICD-10-CM

## 2015-03-05 DIAGNOSIS — E1169 Type 2 diabetes mellitus with other specified complication: Secondary | ICD-10-CM

## 2015-03-05 DIAGNOSIS — E119 Type 2 diabetes mellitus without complications: Secondary | ICD-10-CM | POA: Diagnosis not present

## 2015-03-05 NOTE — Patient Instructions (Addendum)
Dear Makayla Vasquez, Thank you for coming in to clinic today. It was good to see you again!  1. For your Diabetes and Blood Sugar - Uncontrolled blood sugar right now, A1c >15.0 - please write down all blood sugars, check 2-3 times daily - fasting in morning and evening 2 hour after dinner. - Blood Sugar Plan: - Increase LANTUS to 40 units daily before 10 AM - If your fasting CBG (in AM before food/drink) is >200 over half of days in next week, increase by 3 units - ONLY INCREASE EVERY 3 to 5 days - If you get to 50 units Lantus, then stay at that level  - Keep taking Metformin 1000mg  twice daily  - Get the Glucerna meal substitute shake - drink one for lunch daily (and have a snack if needed between meals) - Try to eat 2 regular meals EVERY day - to get on a regular schedule (breakfast and dinner)  Doctor next week.  Please schedule a follow-up appointment with Dr. Mollie Germany in 2 months for follow-up Diabetes.  If you have any other questions or concerns, please feel free to call the clinic to contact me. You may also schedule an earlier appointment if necessary.  However, if your symptoms get significantly worse, please go to the Emergency Department to seek immediate medical attention.  Makayla Charon, DO [facility]

## 2015-03-05 NOTE — Progress Notes (Signed)
   Subjective:    Patient ID: Makayla Vasquez, female    DOB: 1980/11/17, 34 y.o.   MRN: [id number]  HPI  CHRONIC DM, Type 2, steroid induced - History of chronic prednisone taper from 40mg  daily down to current 5mg  qod (over past >6 months) Reports - Feeling better and improved CBG control since last visit on 4/29 with me. At that time, increased Metformin from 500 to 1000mg  BID, and inc Lantus 24 to 35u daily CBGs: Avg 250, Low 194, High 393. Checks CBGs 1x daily (AM fasting), occasional re-check later in day if feeling bad Meds: Lantus 35u daily (9-10am), Metformin 1000mg  BID, Cinnamon pill Reports good compliance. Tolerating well w/o side-effects Currently on ARB Lifestyle: Diet (continues to improve DM diet, reduce carbs), has not incorporated regular exercise yet - Scheduled Ophthalmology apt next week with Dr. (establish for annual DM eye exam, referred at last visit) - advised to request records sent to [facility] Admits - improved polydipsia / polyuria Denies hypoglycemia, visual changes, numbness or tingling.   I have reviewed and updated the following as appropriate: allergies and current medications  Social Hx: - Former smoker  Review of Systems  See above HPI    Objective:   Physical Exam  BP 102/72 mmHg  Pulse 80  Temp(Src) 98.4 F (36.9 C) (Oral)  Ht 5\' 5"  (1.651 m)  Wt 194 lb (87.998 kg)  BMI 32.28 kg/m2  LMP 01/19/2015  Gen - obese, well-appearing, cooperative, NAD HEENT - MMM Heart - RRR, no murmurs heard Abd - soft, NTND, active +BS Skin - warm, dry, no rashes Neuro - awake, alert     Assessment & Plan:   See specific A&P problem list for details.

## 2015-03-05 NOTE — Assessment & Plan Note (Signed)
Gradual improving control. Adhering to med titration regimen now. Additionally improved now that tapered prednisone to 5mg  qod - Last A1c >15 (02/15/15) - too soon to repeat - No complications or hypoglycemia  Plan: 1. Increase Lantus dose from 35u daily to 40u daily. Instructions to titrate up by 3u every 5 days if fasting AM CBG >200 consistently. Anticipate to titrate up to about 50 then stop. 2. Continue written CBG record, advised to check at least x 2 daily - currently only has fasting AM CBG. 3. Continue Metformin 1000mg  BID, improved CHF, NYHA-I-II 4. Continue improved DM diet. Need to start exercise regimen as discussed, gradual plan to start once cleared by Cardio/Pulm 5. Scheduled for Ophthalmology - Dr. 02/17/15, establish annual DM eye exam next week 6. RTC 2 months for A1c, consider referral to Pharmacy Clinic for DM management

## 2015-03-12 ENCOUNTER — Telehealth: Payer: Self-pay | Admitting: Family Medicine

## 2015-03-12 ENCOUNTER — Other Ambulatory Visit: Payer: Self-pay | Admitting: Family Medicine

## 2015-03-12 DIAGNOSIS — E669 Obesity, unspecified: Principal | ICD-10-CM

## 2015-03-12 DIAGNOSIS — E1169 Type 2 diabetes mellitus with other specified complication: Secondary | ICD-10-CM

## 2015-03-12 MED ORDER — METFORMIN HCL 1000 MG PO TABS: 1000.0000 mg | ORAL_TABLET | Freq: Two times a day (BID) | ORAL | Status: DC

## 2015-03-12 NOTE — Telephone Encounter (Signed)
If possible [name] would like for her TB scan test results to be faxed to her place of employment at [phone]. If the policy does not allow this, as I am unsure, please inform the patient. Thank you Makayla Vasquez, ASA

## 2015-03-13 NOTE — Telephone Encounter (Signed)
Letter faxed to number provided.
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
## 2015-03-22 ENCOUNTER — Encounter: Payer: Self-pay | Admitting: Internal Medicine

## 2015-03-23 ENCOUNTER — Encounter: Payer: Self-pay | Admitting: *Deleted

## 2015-03-29 ENCOUNTER — Other Ambulatory Visit (HOSPITAL_COMMUNITY): Payer: Self-pay | Admitting: Internal Medicine

## 2015-04-09 ENCOUNTER — Telehealth: Payer: Self-pay | Admitting: Family Medicine

## 2015-04-09 NOTE — Telephone Encounter (Signed)
Would like to speak to someone about her sugar. She states that her sugar has been running low (under 150) and she feels sluggish. She would like to know if she should still take her insulin along with her metformin or if she should cut back on the insulin. Please advise. Thank you, Dorothey Baseman, ASA

## 2015-04-10 NOTE — Telephone Encounter (Addendum)
Reviewed chart, last seen 03/05/15 by me at [facility] for DM follow-up, recently had A1c >15.0 (4/29), at this visit her Lantus was inc from Lantus 35u to 40u daily, with instructions to gradually titrate up to goal 50u then to stop and follow-up, also on Metformin 1000mg  BID. Her CBGs at that time were Avg 250, Low 194, High 393 with daily fasting CBG check. No significant symptoms of hypoglycemia.  Additionally, patient is on chronic prednisone 5mg  PO daily (chronic taper x 6 months) and has been living at chronically elevated CBGs for a while now, discussed in detail that once we get her CBGs under better control there will be an adjustment period of not feeling well, similar to hypoglycemia. It seems like this is what may be going on with avg CBGs 150s. I would recommend that she continue Metformin (should not be making her hypoglycemic), and may titrate down on Lantus and frequent meals to avoid hypoglycemia.  Received this message 6/21 0815, and attempted to call pt at that time, no answer. Will try to re-contact later today. If calls back again and I am unavailable while on call. May discuss with preceptor as needed. See the above information.  UPDATE 1530 - able to reach patient. Spoke with . She reported that her CBG this AM was < 150 and she held her lantus dose. Later checked CBG at 83, she is concerned with accuracy of her meter. However, she has been on Lantus 40u since last apt and had been doing very well with CBGs < 200. Currently feels well except a "little sluggish", tolerating PO well, no n/v, fevers/chills, abd pain.  I advised her to skip her Lantus dose today since it is now after 3pm and last CBG was 83 without any (last dose yesterday AM Lantus 40). Keep checking CBG tonight. Plan to resume insulin tomorrow with fasting AM CBG if < 100 can delay until noon, and if <150 can scale back to 35u Lantus, if >150 can resume Lantus 40u. Resume Metformin. Call back with questions. Plan to  follow-up with me in clinic 1 month for DM.  7/21, DO [facility], PGY-2

## 2015-04-13 ENCOUNTER — Ambulatory Visit: Payer: Medicare Other | Admitting: Internal Medicine

## 2015-04-14 ENCOUNTER — Other Ambulatory Visit: Payer: Self-pay | Admitting: Cardiology

## 2015-04-14 ENCOUNTER — Other Ambulatory Visit: Payer: Self-pay | Admitting: Family Medicine

## 2015-04-18 ENCOUNTER — Encounter: Payer: Self-pay | Admitting: Family Medicine

## 2015-04-18 ENCOUNTER — Ambulatory Visit (INDEPENDENT_AMBULATORY_CARE_PROVIDER_SITE_OTHER): Payer: Medicare Other | Admitting: Family Medicine

## 2015-04-18 VITALS — BP 126/72 | HR 99 | Temp 98.5°F | Wt 199.0 lb

## 2015-04-18 DIAGNOSIS — J069 Acute upper respiratory infection, unspecified: Secondary | ICD-10-CM

## 2015-04-18 DIAGNOSIS — R300 Dysuria: Secondary | ICD-10-CM | POA: Diagnosis not present

## 2015-04-18 LAB — POCT URINALYSIS DIPSTICK
Bilirubin, UA: NEGATIVE
Blood, UA: NEGATIVE
Glucose, UA: NEGATIVE
Ketones, UA: NEGATIVE
Leukocytes, UA: NEGATIVE
Nitrite, UA: NEGATIVE
PH UA: 6
PROTEIN UA: NEGATIVE
Spec Grav, UA: 1.015
UROBILINOGEN UA: 0.2

## 2015-04-18 MED ORDER — ACETAMINOPHEN 500 MG PO TABS: 500.0000 mg | ORAL_TABLET | Freq: Two times a day (BID) | ORAL | Status: DC

## 2015-04-18 NOTE — Patient Instructions (Signed)
It was a pleasure seeing you today in our clinic. Today we discussed your body aches. Here is the treatment plan we have discussed and agreed upon together:   - I believe you likely have a viral infection at this time. - I have written a prescription for Tylenol. Take this 2 times a day for the next 5 days for any body aches of fevers.  - if your symptoms worsen or persist beyond this weekend then please schedule a follow up appointment in our office or see the urgent care center if our office is closed.

## 2015-04-19 ENCOUNTER — Encounter: Payer: Self-pay | Admitting: Internal Medicine

## 2015-04-19 ENCOUNTER — Ambulatory Visit (INDEPENDENT_AMBULATORY_CARE_PROVIDER_SITE_OTHER): Payer: Medicare Other | Admitting: Internal Medicine

## 2015-04-19 VITALS — BP 116/80 | HR 104 | Ht 66.0 in | Wt 199.2 lb

## 2015-04-19 DIAGNOSIS — J9611 Chronic respiratory failure with hypoxia: Secondary | ICD-10-CM

## 2015-04-19 DIAGNOSIS — J329 Chronic sinusitis, unspecified: Secondary | ICD-10-CM | POA: Insufficient documentation

## 2015-04-19 DIAGNOSIS — J310 Chronic rhinitis: Secondary | ICD-10-CM | POA: Diagnosis not present

## 2015-04-19 MED ORDER — AZELASTINE-FLUTICASONE 137-50 MCG/ACT NA SUSP: 2.0000 | Freq: Every day | NASAL | Status: DC

## 2015-04-19 NOTE — Assessment & Plan Note (Addendum)
Patient's signs and symptoms are most consistent w/ a viral URI. Symptoms appear to be fairly mild at this time. Patient has had no fever, but body aches seem to be her greatest concern at this time. Patient initially thought she had a UTI (although denied all sxs of a UTI) -- UA was clean. - Tylenol; patient was instructed to pick up OTC but she asked for prescription  - 500mg  BID for 10 days for body aches.  - Normally I would recommend decongestant but patient's cardiac hx makes a medication like pseudoephedrine less appealing. - Warm steamy showers for congestion and aches.  - patient is to return in 1 wk if symptoms persist or worsen; at that time, consideration for abx will likely be warranted.

## 2015-04-19 NOTE — Patient Instructions (Signed)
Ok to stop prednisone to watch and see how you do  Continue Oxygen at 2L for sleep and if needed  Sample Dymista nasal spray   1-2 puffs each nostril once daily at bedtime. See if it helps the post nasal drip

## 2015-04-19 NOTE — Progress Notes (Signed)
   HPI  CC: body aches Patient has been experiencing body aches for the past 3-4 days. She states that these symptoms have been accompanied by some low back pain/aches and nasal congestion. Initial symptoms appear to have been some nasal congestion and mucus production. This started 4 days prior. This was quickly followed by the body aches. No change in respiratory status from baseline. She has not taken anything for this. She denies any HA, ST, fever, chills, malaise, SOB, CP, n/v/d/c, abdominal pain, dysuria, or trauma. Endorses a slight cough.   Review of Systems   See HPI for ROS. All other systems reviewed and are negative.  Past medical history and social history reviewed and updated in the EMR as appropriate.  Objective: BP 126/72 mmHg  Pulse 99  Temp(Src) 98.5 F (36.9 C) (Oral)  Wt 199 lb (90.266 kg)  LMP 03/28/2015 Gen: NAD, alert, cooperative, and pleasant. HEENT: NCAT, EOMI, PERRL, nasal cavity congestion noted, OP w/ some erythema present. No exudate.  CV: RRR, no murmur Resp: CTAB, no wheezes, non-labored, good air movement Abd: SNTND, BS present, no guarding or organomegaly Ext: No edema, warm Neuro: Alert and oriented, Speech clear, No gross deficits  Assessment and plan:  URI (upper respiratory infection) Patient's signs and symptoms are most consistent w/ a viral URI. Symptoms appear to be fairly mild at this time. Patient has had no fever, but body aches seem to be her greatest concern at this time. Patient initially thought she had a UTI (although denied all sxs of a UTI) -- UA was clean. - Tylenol; patient was instructed to pick up OTC but she asked for prescription  - 500mg  BID for 10 days for body aches.  - Normally I would recommend decongestant but patient's cardiac hx makes a medication like pseudoephedrine less appealing. - Warm steamy showers for congestion and aches.  - patient is to return in 1 wk if symptoms persist or worsen; at that time,  consideration for abx will likely be warranted.     Orders Placed This Encounter  Procedures  . POCT urinalysis dipstick    Meds ordered this encounter  Medications  . acetaminophen (TYLENOL) 500 MG tablet    Sig: Take 1 tablet (500 mg total) by mouth 2 (two) times daily.    Dispense:  10 tablet    Refill:  0     , MD,MS,  PGY1 04/19/2015 12:04 PM

## 2015-04-19 NOTE — Progress Notes (Signed)
02/23/14- 36 yoF former smoker Referred by Dr Bridgett Larsson for allergy consult.  C/o itchy, watery eyes, sinus congestion, headaches, prodcough with green mucous X several years. Mother and son are here She had been going to allergist at [facility] for allergic rhinitis, itching eyes and chest tightness for years as an adult. Typically worse in spring and summer, with occasional colds and with exposure to house dust. Daily wheeze.She tried allergy shots for 2 months, 2 years ago but had strong local reactions. 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 Mother has hx allergies. CXR 5/ 8 /14 IMPRESSION:  No active disease. No significant change.  Original Report Authenticated By: Lahoma Crocker, M.D.  04/06/14- 67 yoF former smoker Referred by Dr Bridgett Larsson for allergy consult.  C/o itchy, watery eyes, sinus congestion, headaches, prodcough with green mucous X several years. Mother and son are here FOLLOWS FOR: Allergies have been doing well until outdoors with trees,etc. Allergy profile 02/23/2014-negative with total IgE 22.9. She does give history of allergy shots in the past, stopped because of large local reactions. Past history suggests stronger allergic reactions when she was younger. We can retest her if there is a question She still reports some itching in her throat and cough sometimes green sputum  06/27/14 [facility] follow up  Returns for follow up from recent hospital stay.  Reports breathing is doing well today. Is suppose to be on O2 at 4lm however did not bring O2 b/c   portable tank at home is malfunctioned. DME contacted and at home to fix system. Advised on importance of O2 use.  Says were very low off Oxygen - sats 54%-83% on room air unpon entering the exam room. Sats increased 90% on 4l/m  Admitted last week for significant hypoxia   Patient presented with hypoxemia of unknown etiology, requiring 4L of oxygen. Notably, she was hospitalized from 8/11-8/14 with a  similar presentation and was thought to have a viral pneumonitis complicated by atelectasis and mild edema vs. an autoimmune process and was discharged home on a course of levaquin and prednisone.  Her CT scan from 8/28 showed bilateral progressive lower peribronchial infiltrates with groundglass changes and nodular patter consistent with acute pneumonitis of unclear etiology. During her last admission, she was found to have a positive ANA, mildly elevated ESR, non-reactive HIV, and negative PPD. Also during her last hospitalization, cardiology saw her for a repeat echo that showed a decrease in EF (25-30%, from baseline 30-25% in 2013).  Viral panel was positive for rhinovirus previous admission but neg for this admit .  ESR was elevated 62 , decreased today at 55.  Pulmonology was consulted and did a BAL on 9/1 that showed 60% macrophages and 30% PMNs. Thus far, her AFB smear, fungal smear, legionella Ab, pneumocystis smear, ACE, and sputum culture are negative. Fungal neg , bal cx neg w/ non path oral flora , legionella neg  Has family hx of Lupus.  Anti smith ab neg and anti dsDNA neg  HSP panel was neg.  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 Status: Edited Related Problem: ILD (interstitial lung disease)    #1 bilateral progressive lower lobe peribronchial infiltrates with groundglass changes and nodular pattern consistent with acute pneumonitis of unclear etiology.  Note previous serologic studies show a speckled positive ANA 1-80 pattern and no evidence of  allergy factors or elevated IgE. This inflammatory pneumonitis is not yet discerned as to etiology therefore bronchoscopy would be indicated and will be pursued  Previous viral panel + for rhinovirus  BAL cx neg to date.  HSP neg .  Will cont on steroid challenge with close  follow up  Case discussed with Dr. Annamaria Boots  Clinically she appears stable , will have her back in office with follow up cxr in 1 week as cxr w/ worse aeration today  Plan  Taper Prednisone 53m daily and hold at this dose.  Continue on Oxygen 4l/m continuous flow 24/7 .  Labs and chest xray today .  Follow up Dr. YAnnamaria BootsIn 1 weeks and As needed  Please contact office for sooner follow up if symptoms do not improve or worsen or seek emergency care    07/06/14-32 yoF former smoker originaly referred for allergy evaluation, rhinitis, asthmatic bronchitis, then hosp for ?ALI with lower zone pneumonitis/ BAL and special studies NEG,. Acute on Chronic respiratory failure. Pacemaker for AV block  O2 4L/ Advanced FOLLOWS FOR: Pt c/o DOE, prod cough with clear mucus and chest tightness when coughing. Pt last saw TP on 06/27/2014 for HFU .   08/07/14- 33 yoF former smoker originaly referred for allergy evaluation, rhinitis, asthmatic bronchitis, then hosp for ?ALI with lower zone pneumonitis/ BAL and special studies NEG,. Acute on Chronic respiratory failure. Pacemaker for AV block  O2 4L/ Advanced      Husband and son are here Continues oxygen but feels comfortable at rest taking it off for short intervals. As instructed, prednisone is now down to 20 mg daily for the last 2 weeks. She feels stable, not worse. Coughs a lot, clear mucus. Denies fever, blood, purulent sputum, edema, adenopathy. Pending cardiac ablation seeking better control of heart rate. CXR 07/06/14 IMPRESSION:  1. No change in aeration to the lungs compared with the previous  exam.  Electronically Signed  By: TKerby MoorsM.D.  On: 07/06/2014 15:56  10/08/14-  89yoF former smoker originaly referred for allergy evaluation, rhinitis, asthmatic bronchitis, then hosp for ?ALI with lower zone pneumonitis/ BAL and special studies NEG,. Acute on Chronic respiratory failure. Pacemaker for AV block  O2 4L/ Advanced. Has continued prednisone 20 mg  daily. FOLLOW FOR:  hypoxemia; breathing doing fine, wants to get off oxygen; no complaints  Arrived 78% on 2L, 90% on 4L..[name] Husband and son here. Cough with clear phlegm. Feels better with no acute events. CXR 09/18/14  IMPRESSION: Bibasilar pulmonary infiltrates, improved from 08/07/2014. Electronically Signed  By: TMarcello MooresRegister  On: 09/18/2014 13:35   12/11/14- 89yoF former smoker originaly referred for allergy evaluation, rhinitis, asthmatic bronchitis, then hosp for ?ALI with lower zone pneumonitis/ BAL and special studies NEG,. Acute on Chronic respiratory failure/ Hypoxemia,. Pacemaker for AV block  O2 4L/ Advanced. Has continued prednisone 10 mg daily. FOLLOWS FOR: Pt states her breathing is doing well overall and [facility]30[facility]14 IMPRESSION: 32 x 13 x 10 mm abscess in the upper lip area centrally. Inflamed/hyperplastic neck nodes bilaterally. No significant dental disease or sinus disease. Electronically Signed  By: MKalman JewelsM.D.  On: 10/30/2014 14:31  02/09/15- 89yoF former smoker originaly referred for allergy evaluation, rhinitis, asthmatic bronchitis, then hosp for ?ALI with lower zone pneumonitis/ BAL and special studies NEG,. Acute on Chronic respiratory failure/ Hypoxemia,.DM Pacemaker for AV block  O2 2L/ Advanced. Has continued prednisone 10 mg daily. FOLLOWS FOR:  Pt states she is doing well; Wears O2 as needed during the day and at night. DME -[facility]. Husband and son here Continues prednisone 10 mg daily She says she is doing "great" with little cough and no acute events. Her oxygen at 2 L/m prevents her return to work but she hopes to get back quickly. Walk test on room air: 44/22/16: 98%, 92%, 91%, 90% at rest on room air. Pulse rate ranged 74-107. CXR 12/11/14-- I reviewed images. Coarse prominent lung markings in lower third bilaterally. Significantly improved from acute picture. IMPRESSION: 1. Persistent lung base  opacity similar to prior studies. The etiology of this is unclear, but given the lack of symptoms, it is likely chronic. 2. No convincing acute cardiopulmonary disease. Electronically Signed  By: David Ormond M.D.  On: 12/11/2014 13:36  04/19/15- 33 yoF former smoker originaly referred for allergy evaluation, rhinitis, asthmatic bronchitis, then hosp for ?ALI with lower zone pneumonitis/ BAL and special studies NEG,. Acute on Chronic respiratory failure/ Hypoxemia,.DM Pacemaker for AV block  O2 2L/ Advanced. FOLLOWS FOR Pt states she is doing well. Wears O2 2L as needed during the day and at night. DME- [facility] Now prednisone 5 mg every other day. Notices a little postnasal drip but denies cough, wheeze, chest pain. CXR 02/09/15-  IMPRESSION: Chronic changes at the lung bases. There is no active cardiopulmonary disease. Electronically Signed  By: David Jordan M.D.  On: 02/09/2015 15:08  ROS-see HPI Constitutional:   No-   weight loss, night sweats, fevers, chills, fatigue, lassitude. HEENT:   , difficulty swallowing, tooth/dental problems, sore throat,       sneezing, itching, ear ache, nasal congestion, post nasal drip,  CV:  No-   chest pain, orthopnea, PND, swelling in lower extremities, anasarca,                                  dizziness, palpitations Resp: No-   shortness of breath with exertion or at rest.              productive cough,  No non-productive cough,  No- coughing up of blood.               No- wheezing.   Skin: No-   rash or lesions. GI:   heartburn, indigestion, abdominal pain, nausea, vomiting,  GU:  MS:  No-   joint pain or swelling.   Neuro-     nothing unusual Psych:  No- change in mood or affect. No depression or anxiety.  No memory loss.  OBJ- Physical Exam   O2 90% RA at rest General- Alert, Oriented, Affect-appropriate, Distress- none acute,  Skin- +acanthotic hyperpigmentation around neck Lymphadenopathy- none Head- atraumatic            Eyes-  Gross vision intact, PERRLA, conjunctivae and secretions clear            Ears- Hearing, canals-normal            Nose-  no-Septal dev, mucus, polyps, erosion, perforation             Throat- Mallampati III , mucosa clear , drainage- none, tonsils- atrophic Neck- flexible , trachea midline, no stridor , thyroid nl, carotid no bruit Chest - symmetrical excursion , unlabored           Heart/CV- RRR , no murmur , no gallop  , no rub, nl s1 s2                           -   JVD- none , edema- none, stasis changes- none, varices- none           Lung- clear to P&A diminished in bases, wheeze- none, cough-none , dullness-none, rub- none.           Chest wall-  Abd-  Br/ Gen/ Rectal- Not done, not indicated Extrem- cyanosis- none, clubbing, none, atrophy- none, strength- nl Neuro- grossly intact to observation

## 2015-04-23 NOTE — Assessment & Plan Note (Signed)
Mild persistent postnasal drainage complaint without symptoms of sinusitis. Plan-try sample Dymista

## 2015-04-23 NOTE — Assessment & Plan Note (Signed)
Gradual improvement has taken a couple of years but her acute lung injury may be resolved now to baseline. We are going to stop prednisone for observation, use oxygen at night and only if needed during the day

## 2015-04-24 ENCOUNTER — Other Ambulatory Visit: Payer: Self-pay | Admitting: Family Medicine

## 2015-04-24 ENCOUNTER — Other Ambulatory Visit: Payer: Self-pay | Admitting: Cardiology

## 2015-04-25 ENCOUNTER — Emergency Department (HOSPITAL_COMMUNITY)
Admission: EM | Admit: 2015-04-25 | Discharge: 2015-04-25 | Disposition: A | Discharge status: Home / Self Care | Payer: Medicare Other | Attending: Emergency Medicine | Admitting: Emergency Medicine

## 2015-04-25 ENCOUNTER — Encounter (HOSPITAL_COMMUNITY): Payer: Self-pay | Admitting: Physical Medicine and Rehabilitation

## 2015-04-25 ENCOUNTER — Emergency Department (HOSPITAL_COMMUNITY): Payer: Medicare Other

## 2015-04-25 DIAGNOSIS — M25551 Pain in right hip: Secondary | ICD-10-CM | POA: Insufficient documentation

## 2015-04-25 DIAGNOSIS — Z3202 Encounter for pregnancy test, result negative: Secondary | ICD-10-CM | POA: Diagnosis not present

## 2015-04-25 DIAGNOSIS — E669 Obesity, unspecified: Secondary | ICD-10-CM | POA: Insufficient documentation

## 2015-04-25 DIAGNOSIS — Z8669 Personal history of other diseases of the nervous system and sense organs: Secondary | ICD-10-CM | POA: Insufficient documentation

## 2015-04-25 DIAGNOSIS — Z87891 Personal history of nicotine dependence: Secondary | ICD-10-CM | POA: Diagnosis not present

## 2015-04-25 DIAGNOSIS — Z7951 Long term (current) use of inhaled steroids: Secondary | ICD-10-CM | POA: Insufficient documentation

## 2015-04-25 DIAGNOSIS — Z8701 Personal history of pneumonia (recurrent): Secondary | ICD-10-CM | POA: Diagnosis not present

## 2015-04-25 DIAGNOSIS — E119 Type 2 diabetes mellitus without complications: Secondary | ICD-10-CM | POA: Diagnosis not present

## 2015-04-25 DIAGNOSIS — J45909 Unspecified asthma, uncomplicated: Secondary | ICD-10-CM | POA: Insufficient documentation

## 2015-04-25 DIAGNOSIS — I10 Essential (primary) hypertension: Secondary | ICD-10-CM | POA: Insufficient documentation

## 2015-04-25 DIAGNOSIS — M2550 Pain in unspecified joint: Secondary | ICD-10-CM

## 2015-04-25 DIAGNOSIS — Q246 Congenital heart block: Secondary | ICD-10-CM | POA: Insufficient documentation

## 2015-04-25 DIAGNOSIS — M1990 Unspecified osteoarthritis, unspecified site: Secondary | ICD-10-CM | POA: Diagnosis not present

## 2015-04-25 DIAGNOSIS — Z8719 Personal history of other diseases of the digestive system: Secondary | ICD-10-CM | POA: Insufficient documentation

## 2015-04-25 DIAGNOSIS — Z950 Presence of cardiac pacemaker: Secondary | ICD-10-CM | POA: Insufficient documentation

## 2015-04-25 DIAGNOSIS — I509 Heart failure, unspecified: Secondary | ICD-10-CM | POA: Insufficient documentation

## 2015-04-25 DIAGNOSIS — Z79899 Other long term (current) drug therapy: Secondary | ICD-10-CM | POA: Insufficient documentation

## 2015-04-25 DIAGNOSIS — M25512 Pain in left shoulder: Secondary | ICD-10-CM | POA: Diagnosis not present

## 2015-04-25 DIAGNOSIS — Z8659 Personal history of other mental and behavioral disorders: Secondary | ICD-10-CM | POA: Diagnosis not present

## 2015-04-25 DIAGNOSIS — Z9889 Other specified postprocedural states: Secondary | ICD-10-CM | POA: Diagnosis not present

## 2015-04-25 DIAGNOSIS — Z794 Long term (current) use of insulin: Secondary | ICD-10-CM | POA: Diagnosis not present

## 2015-04-25 LAB — CBC WITH DIFFERENTIAL/PLATELET
BASOS ABS: 0 10*3/uL (ref 0.0–0.1)
BASOS PCT: 0 % (ref 0–1)
EOS PCT: 1 % (ref 0–5)
Eosinophils Absolute: 0.1 10*3/uL (ref 0.0–0.7)
HEMATOCRIT: 39.9 % (ref 36.0–46.0)
HEMOGLOBIN: 13.3 g/dL (ref 12.0–15.0)
Lymphocytes Relative: 21 % (ref 12–46)
Lymphs Abs: 1.1 10*3/uL (ref 0.7–4.0)
MCH: 28.7 pg (ref 26.0–34.0)
MCHC: 33.3 g/dL (ref 30.0–36.0)
MCV: 86 fL (ref 78.0–100.0)
MONO ABS: 0.4 10*3/uL (ref 0.1–1.0)
Monocytes Relative: 8 % (ref 3–12)
Neutro Abs: 3.9 10*3/uL (ref 1.7–7.7)
Neutrophils Relative %: 70 % (ref 43–77)
Platelets: 260 10*3/uL (ref 150–400)
RBC: 4.64 MIL/uL (ref 3.87–5.11)
RDW: 12.8 % (ref 11.5–15.5)
WBC: 5.5 10*3/uL (ref 4.0–10.5)

## 2015-04-25 LAB — BASIC METABOLIC PANEL
Anion gap: 9 (ref 5–15)
CHLORIDE: 107 mmol/L (ref 101–111)
CO2: 21 mmol/L — ABNORMAL LOW (ref 22–32)
Calcium: 8.8 mg/dL — ABNORMAL LOW (ref 8.9–10.3)
Creatinine, Ser: 0.79 mg/dL (ref 0.44–1.00)
GLUCOSE: 114 mg/dL — AB (ref 65–99)
POTASSIUM: 3.5 mmol/L (ref 3.5–5.1)
SODIUM: 137 mmol/L (ref 135–145)

## 2015-04-25 LAB — URINALYSIS, ROUTINE W REFLEX MICROSCOPIC
Glucose, UA: NEGATIVE mg/dL
HGB URINE DIPSTICK: NEGATIVE
Ketones, ur: 15 mg/dL — AB
Nitrite: NEGATIVE
PROTEIN: 30 mg/dL — AB
Specific Gravity, Urine: 1.03 (ref 1.005–1.030)
UROBILINOGEN UA: 4 mg/dL — AB (ref 0.0–1.0)
pH: 5.5 (ref 5.0–8.0)

## 2015-04-25 LAB — URINE MICROSCOPIC-ADD ON

## 2015-04-25 LAB — POC URINE PREG, ED: PREG TEST UR: NEGATIVE

## 2015-04-25 MED ORDER — KETOROLAC TROMETHAMINE 30 MG/ML IJ SOLN
30.0000 mg | Freq: Once | INTRAMUSCULAR | Status: AC
  Administered 2015-04-25: 30 mg via INTRAMUSCULAR
  Filled 2015-04-25: qty 1

## 2015-04-25 MED ORDER — TRAMADOL HCL 50 MG PO TABS: 50.0000 mg | ORAL_TABLET | Freq: Four times a day (QID) | ORAL | Status: DC | PRN

## 2015-04-25 MED ORDER — PREDNISONE 20 MG PO TABS
60.0000 mg | ORAL_TABLET | ORAL | Status: AC
  Administered 2015-04-25: 60 mg via ORAL
  Filled 2015-04-25: qty 3

## 2015-04-25 NOTE — ED Provider Notes (Signed)
CSN: [id number]     Arrival date & time 04/25/15  1151 History   First MD Initiated Contact with Patient 04/25/15 1155     Chief Complaint  Patient presents with  . Pain     (Consider location/radiation/quality/duration/timing/severity/associated sxs/prior Treatment) HPI Patient presents with pain in multiple areas. Patient's history of pain, autoimmune disease, now presents with concern of ongoing pain, though without acute changes over the past days. Primarily the pain is in the left shoulder, right hip. No new fever, chills. Pain is sore, severe, not improved with OTC medication. No new loss of sensation or function, strength in any of the extremities, no new abdominal pain, fever, chills.  Past Medical History  Diagnosis Date  . Cardiac pacemaker     a. Since age 81 in 82. b. Upgrade to BiV in 2013.  . Congenital complete AV block   . Obesity   . GERD (gastroesophageal reflux disease)   . Asymptomatic LV dysfunction     a. Echo in Dec 2011 with EF 35 to 40%. Felt to be due to paced rhythm. b. EF 25-30% in 07/2014.  . Seizures     as a child- from high fever  . Anxiety   . Bipolar affective disorder   . Depression     bipolar  . Carpal tunnel syndrome of right wrist   . Asthma     seasonal allergies   . Arthritis     rheumatoid arthritis- mild, no rheumatology care   . Hypertension   . Pneumonitis     a. a/w hypoxia - inflammatory - large workup 07/2014.  [name] Sinus tachycardia   . Diabetes mellitus without complication   . Presence of permanent cardiac pacemaker   . CHF (congestive heart failure)    Past Surgical History  Procedure Laterality Date  . Throat surgery  1994    s/p laser treatment  . Cesarean section    . Insert / replace / remove pacemaker      2001  . Cholecystectomy    . Iud removal  11/03/2011    Procedure: INTRAUTERINE DEVICE (IUD) REMOVAL;  Surgeon: [name], MD;  Location: [facility];  Service: Gynecology;  Laterality: N/A;  . Cyst excision   12/10/2012    THYROID  . Thyroglossal duct cyst N/A 12/10/2012    Procedure: REVISION OF THYROGLOSSAL DUCT CYST EXCISION;  Surgeon: [name], MD;  Location: [place];  Service: ENT;  Laterality: N/A;  Revision of Thyroglossal Duct Cyst Excision  . Nasal fracture surgery      /w plate   . Carpal tunnel with cubital tunnel Right 07/26/2013    Procedure: RIGHT LIMITED OPEN CARPAL TUNNEL RELEASE ,  RIGHT CUBITAL TUNNEL RELEASE, INSITU VERSES ULNAR NERVE DECOMPRESSION AND ANTERIOR TRANSPOSITION;  Surgeon: [name], MD;  Location: [place];  Service: Orthopedics;  Laterality: Right;  . Video bronchoscopy Bilateral 06/19/2014    Procedure: VIDEO BRONCHOSCOPY WITHOUT FLUORO;  Surgeon: Brand Males, MD;  Location: [place];  Service: Cardiopulmonary;  Laterality: Bilateral;  . Bi-ventricular pacemaker upgrade N/A 03/08/2012    Procedure: BI-VENTRICULAR PACEMAKER UPGRADE;  Surgeon: [name], MD;  Location: [facility] C[facility] LAB;  Service: Cardiovascular;  Laterality: N/A;  . Atrial tach ablation N/A 08/14/2014    Procedure: ATRIAL TACH ABLATION;  Surgeon: [name], MD;  Location: [facility] CATH LAB;  Service: Cardiovascular;  Laterality: N/A;  . Right heart catheterization N/A 10/26/2014    Procedure: RIGHT HEART CATH;  Surgeon: Shaune Pascal Bensimhon,  MD;  Location: [place] CATH LAB;  Service: Cardiovascular;  Laterality: N/A;   Family History  Problem Relation Age of Onset  . Heart disease Mother     CHF (no details)  . Hypertension Mother   . Heart disease Father     Murmur  . Heart disease Sister 32     No details.  History of a pacemaker  . Lupus Mother    History  Substance Use Topics  . Smoking status: Former Smoker -- 0.25 packs/day for .5 years    Types: Cigarettes    Quit date: 07/25/1996  . Smokeless tobacco: Never Used  . Alcohol Use: No   OB History    Gravida Para Term Preterm AB TAB SAB Ectopic Multiple Living   1 1 1       1      Review of Systems  Constitutional:       Per  HPI, otherwise negative  HENT:       Per HPI, otherwise negative  Respiratory:       Per HPI, otherwise negative  Cardiovascular:       Per HPI, otherwise negative  Gastrointestinal: Negative for vomiting.  Endocrine:       Negative aside from HPI  Genitourinary:       Neg aside from HPI   Musculoskeletal:       Per HPI, otherwise negative  Skin: Negative.   Neurological: Negative for syncope.      Allergies  Sertraline hcl and Tape  Home Medications   Prior to Admission medications   Medication Sig Start Date End Date Taking? Authorizing Provider  ACCU-CHEK SOFTCLIX LANCETS lancets Test blood sugar 3 times daily. 10/30/14   [name], DO  acetaminophen (TYLENOL) 500 MG tablet Take 1 tablet (500 mg total) by mouth 2 (two) times daily. 04/18/15   Elberta Leatherwood, MD  albuterol (PROVENTIL HFA;VENTOLIN HFA) 108 (90 BASE) MCG/ACT inhaler Inhale 2 puffs into the lungs every 4 (four) hours as needed for wheezing or shortness of breath. 06/16/14   [name], DO  bisoprolol (ZEBETA) 5 MG tablet Take 0.5 tablets (2.5 mg total) by mouth daily. 01/05/15   [name], MD  bisoprolol (ZEBETA) 5 MG tablet TAKE 1/2 TABLET BY MOUTH DALY 03/29/15   [name], MD  Blood Glucose Monitoring Suppl (ONE TOUCH ULTRA 2) W/DEVICE KIT Check blood sugar daily before breakfast 10/25/14   Leone Brand, MD  budesonide-formoterol [facility]) 80-4.5 MCG/ACT inhaler Inhale 2 puffs into the lungs 2 (two) times daily as needed (for shortness of breath).  04/06/14   Deneise Lever, MD  cetirizine (ZYRTEC) 10 MG tablet TAKE 1 TABLET (10 MG TOTAL) BY MOUTH DAILY. 04/16/15   [name], DO  etonogestrel (NEXPLANON) 68 MG IMPL implant 1 each by Subdermal route once.    Historical Provider, MD  furosemide (LASIX) 20 MG tablet TAKE 1 TABLET BY MOUTH EVERY DAY FOR SHORTNESS OF BREATH AND/OR LEG SWELLING 04/24/15   [name], DO  glucose blood (ONE TOUCH ULTRA  TEST) test strip Check blood sugar daily before breakfast 10/25/14   Leone Brand, MD  Insulin Glargine (LANTUS SOLOSTAR) 100 UNIT/ML Solostar Pen Inject 35-50 units subcutaneously daily, as instructed. 02/16/15   [name], DO  Insulin Pen Needle (INSUPEN PEN NEEDLES) 32G X 4 MM MISC Inject insulin via insulin pen 1-2 x daily 11/03/14   [name], DO  losartan (COZAAR) 25 MG tablet TAKE  1 TABLET BY MOUTH 2 TIMES DAILY. 04/16/15   [name], MD  metFORMIN (GLUCOPHAGE) 1000 MG tablet Take 1 tablet (1,000 mg total) by mouth 2 (two) times daily with a meal. 03/12/15   [name], DO  Nutritional Supplements (GLUCERNA ADVANCE SHAKE) LIQD Take supplement daily in place of 1 meal. 11/03/14   [name], DO  potassium chloride SA (KLOR-CON M20) 20 MEQ tablet TAKE 40 MEQ TODAY (2 TABLETS) THEN TAKE 20 MEQ ON DAYS THAT LASIX IS TAKEN 02/12/15   [name], MD  predniSONE (DELTASONE) 5 MG tablet Take 5 mg by mouth every other day.    Historical Provider, MD  spironolactone (ALDACTONE) 25 MG tablet TAKE 1 TABLET BY MOUTH EVERY DAY 04/24/15   Shaune Pascal Bensimhon, MD   BP 102/65 mmHg  Pulse 90  Temp(Src) 98.8 F (37.1 C) (Oral)  Resp 14  SpO2 93%  LMP 03/28/2015 Physical Exam  Constitutional: She is oriented to person, place, and time. She appears well-developed and well-nourished. No distress.  HENT:  Head: Normocephalic and atraumatic.  Eyes: Conjunctivae and EOM are normal.  Cardiovascular: Normal rate and regular rhythm.   Pulmonary/Chest: Effort normal and breath sounds normal. No stridor. No respiratory distress.  Abdominal: She exhibits no distension.  Musculoskeletal: She exhibits no edema.       Left shoulder: Normal.       Legs: Neurological: She is alert and oriented to person, place, and time. No cranial nerve deficit.  Skin: Skin is warm and dry.  Psychiatric: She has a normal mood and affect.  Nursing note and vitals  reviewed.   ED Course  Procedures (including critical care time) Labs Review Labs Reviewed  BASIC METABOLIC PANEL - Abnormal; Notable for the following:    CO2 21 (*)    Glucose, Bld 114 (*)    BUN <5 (*)    Calcium 8.8 (*)    All other components within normal limits  URINALYSIS, ROUTINE W REFLEX MICROSCOPIC (NOT AT [facility]) - Abnormal; Notable for the following:    Color, Urine AMBER (*)    APPearance CLOUDY (*)    Bilirubin Urine SMALL (*)    Ketones, ur 15 (*)    Protein, ur 30 (*)    Urobilinogen, UA 4.0 (*)    Leukocytes, UA TRACE (*)    All other components within normal limits  URINE MICROSCOPIC-ADD ON - Abnormal; Notable for the following:    Squamous Epithelial / LPF MANY (*)    Bacteria, UA MANY (*)    All other components within normal limits  CBC WITH DIFFERENTIAL/PLATELET  POC URINE PREG, ED    Imaging Review Dg Chest 2 View  04/25/2015   CLINICAL DATA:  One week of left shoulder pain ; history of asthma, heart block, CHF, and diabetes.  EXAM: CHEST  2 VIEW  COMPARISON:  PA and lateral chest x-ray of February 09, 2015  FINDINGS: The lungs are adequately inflated. The interstitial markings are increased bilaterally but are relatively stable. The cardiac silhouette is top-normal in size. The central pulmonary vascularity is prominent though stable. The permanent pacemaker is unchanged in appearance. There is no pleural effusion.  IMPRESSION: Chronically increased interstitial markings are again demonstrated. There are slightly more conspicuous today. There may be superimposed low-grade CHF. There is no alveolar pneumonia.   Electronically Signed   By: David  Martinique M.D.   On: 04/25/2015 13:40    I reviewed the results (including imaging as performed),  agree with the interpretation  On repeat exam the patient appears better.  We reviewed all findings.  MDM    Generally well-appearing female presents with pain in multiple joints, inconsistently concern of  dyspnea. Patient is chronic lung disease, is being evaluated for autoimmune processes. Patient's evaluation here today has no acute findings, though she is generally uncomfortable. Patient received analgesia, with improvement in her condition. Patient was discharged in stable condition to follow-up with her rheumatologist and primary care physician.  Carmin Muskrat, MD 04/25/15 [phone]

## 2015-04-25 NOTE — Discharge Instructions (Signed)
As discussed, your evaluation today has been largely reassuring.  But, it is important that you monitor your condition carefully, and do not hesitate to return to the ED if you develop new, or concerning changes in your condition. ° °Otherwise, please follow-up with your physician for appropriate ongoing care. ° °Cryotherapy °Cryotherapy means treatment with cold. Ice or gel packs can be used to reduce both pain and swelling. Ice is the most helpful within the first 24 to 48 hours after an injury or flare-up from overusing a muscle or joint. Sprains, strains, spasms, burning pain, shooting pain, and aches can all be eased with ice. Ice can also be used when recovering from surgery. Ice is effective, has very few side effects, and is safe for most people to use. °PRECAUTIONS  °Ice is not a safe treatment option for people with: °· Raynaud phenomenon. This is a condition affecting small blood vessels in the extremities. Exposure to cold may cause your problems to return. °· Cold hypersensitivity. There are many forms of cold hypersensitivity, including: °¨ Cold urticaria. Red, itchy hives appear on the skin when the tissues begin to warm after being iced. °¨ Cold erythema. This is a red, itchy rash caused by exposure to cold. °¨ Cold hemoglobinuria. Red blood cells break down when the tissues begin to warm after being iced. The hemoglobin that carry oxygen are passed into the urine because they cannot combine with blood proteins fast enough. °· Numbness or altered sensitivity in the area being iced. °If you have any of the following conditions, do not use ice until you have discussed cryotherapy with your caregiver: °· Heart conditions, such as arrhythmia, angina, or chronic heart disease. °· High blood pressure. °· Healing wounds or open skin in the area being iced. °· Current infections. °· Rheumatoid arthritis. °· Poor circulation. °· Diabetes. °Ice slows the blood flow in the region it is applied. This is  beneficial when trying to stop inflamed tissues from spreading irritating chemicals to surrounding tissues. However, if you expose your skin to cold temperatures for too long or without the proper protection, you can damage your skin or nerves. Watch for signs of skin damage due to cold. °HOME CARE INSTRUCTIONS °Follow these tips to use ice and cold packs safely. °· Place a dry or damp towel between the ice and skin. A damp towel will cool the skin more quickly, so you may need to shorten the time that the ice is used. °· For a more rapid response, add gentle compression to the ice. °· Ice for no more than 10 to 20 minutes at a time. The bonier the area you are icing, the less time it will take to get the benefits of ice. °· Check your skin after 5 minutes to make sure there are no signs of a poor response to cold or skin damage. °· Rest 20 minutes or more between uses. °· Once your skin is numb, you can end your treatment. You can test numbness by very lightly touching your skin. The touch should be so light that you do not see the skin dimple from the pressure of your fingertip. When using ice, most people will feel these normal sensations in this order: cold, burning, aching, and numbness. °· Do not use ice on someone who cannot communicate their responses to pain, such as small children or people with dementia. °HOW TO MAKE AN ICE PACK °Ice packs are the most common way to use ice therapy. Other methods include ice   massage, ice baths, and cryosprays. Muscle creams that cause a cold, tingly feeling do not offer the same benefits that ice offers and should not be used as a substitute unless recommended by your caregiver. °To make an ice pack, do one of the following: °· Place crushed ice or a bag of frozen vegetables in a sealable plastic bag. Squeeze out the excess air. Place this bag inside another plastic bag. Slide the bag into a pillowcase or place a damp towel between your skin and the bag. °· Mix 3 parts  water with 1 part rubbing alcohol. Freeze the mixture in a sealable plastic bag. When you remove the mixture from the freezer, it will be slushy. Squeeze out the excess air. Place this bag inside another plastic bag. Slide the bag into a pillowcase or place a damp towel between your skin and the bag. °SEEK MEDICAL CARE IF: °· You develop white spots on your skin. This may give the skin a blotchy (mottled) appearance. °· Your skin turns blue or pale. °· Your skin becomes waxy or hard. °· Your swelling gets worse. °MAKE SURE YOU:  °· Understand these instructions. °· Will watch your condition. °· Will get help right away if you are not doing well or get worse. °Document Released: 06/02/2011 Document Revised: 02/20/2014 Document Reviewed: 06/02/2011 °ExitCare® Patient Information ©2015 [employer]. This information is not intended to replace advice given to you by your health care provider. Make sure you discuss any questions you have with your health care provider. ° °

## 2015-04-25 NOTE — ED Notes (Signed)
Pt reports pain to both legs, thighs, and shoulders. 93% RA in triage. Denies SOB/chest pain. Wear O2 2L as needed at home. Pt is alert and oriented x4.

## 2015-04-25 NOTE — ED Notes (Signed)
PT returned from scans. Monitored by pulse ox and bp cuff. 

## 2015-04-26 ENCOUNTER — Telehealth: Payer: Self-pay | Admitting: Family Medicine

## 2015-04-26 DIAGNOSIS — M2550 Pain in unspecified joint: Secondary | ICD-10-CM | POA: Insufficient documentation

## 2015-04-26 NOTE — Telephone Encounter (Signed)
Pt wants to know if she can be referred to a rheumatologist?

## 2015-04-26 NOTE — Telephone Encounter (Signed)
Called patient back, spoke with Makayla Vasquez. Recently seen at [facility] on 7/6 for polyarthralgias, reported history of unclear autoimmune diagnosis, previously followed by Rheumatology Dr. Knute Neu [facility], since retired [place] years ago, now Rheumatology transferred to [facility] [phone]). Additionally recent concerns with chronic ILD of unclear etiology, considered possible autoimmune, following with Pulmology, off Prednisone (on 04/19/15) prolonged course >1 year.  ED recommended patient return to Rheumatology, patient agrees and wishes to re-establish with new Rheumatologist. Agreed to proceed with referral and order placed today 7/7.  Patient scheduled to follow-up at [facility] on Fri 7/8 to further discuss ED follow-up, consider further history regarding potential rheumatological disorder, consider lab work-up for possible RA with anti-CCP vs RF, ESR/CRP. Chart review with prior autoimmune labs 8-06/2014 when initial ILD diagnosis made, note positive speckled ANA, neg, titer 1:80, ACE 36 (nml), neg dsDNA, negative c,p-ANCA. Consider repeat ANA, although may defer to rheumatology.  Nobie Putnam, [place], PGY-3

## 2015-04-27 ENCOUNTER — Ambulatory Visit (INDEPENDENT_AMBULATORY_CARE_PROVIDER_SITE_OTHER): Payer: Medicare Other | Admitting: Family Medicine

## 2015-04-27 ENCOUNTER — Encounter: Payer: Self-pay | Admitting: Family Medicine

## 2015-04-27 VITALS — BP 118/74 | HR 93 | Temp 99.1°F | Ht 66.0 in | Wt 197.8 lb

## 2015-04-27 DIAGNOSIS — M2550 Pain in unspecified joint: Secondary | ICD-10-CM | POA: Diagnosis not present

## 2015-04-27 MED ORDER — HYDROCODONE-ACETAMINOPHEN 10-325 MG PO TABS: 1.0000 | ORAL_TABLET | Freq: Three times a day (TID) | ORAL | Status: DC | PRN

## 2015-04-27 NOTE — Progress Notes (Signed)
Subjective: CARLON DAVIDSON is a 34 y.o. female with a history of chronic HFrEF, atrial tachycardia w/dyssynchrony and biventricular pacemaker, HTN, ILD on prn home oxygen, T2DM, and bipolar d/o presenting for ED followup.   Ms. Jeremiah reported to the [facility] 7/7 with pain in multiple areas, treated without resolution and discharged with Rx for tramadol which she states hasn't worked at all. He pain today started yesterday is "all over," but on specific interrogation involves: neck, lower back, shoulders, right hand, hips, thighs. Does NOT involve knees, lower legs, feet, toes, head, eyes, upper back, chest, abdomen, elbows or left hand. She's right hand dominant. She denies trauma to any area. No fever, chills, fatigue, focal weakness.   PMH documented as RA - mild, no rheumatology care but she has been seen by a now retired Librarian, academic at [facility] most recently 3 years ago. Also worked up for ILD thought to be auto-immune vs. post-infectious/inflammatory. ACE was normal. She was referred to rheumatology in Aug 2015 after admission for viral pneumonitis/PNA found to have a slightly elevated ANA (1:80 speckled) [see data below] but did not see anyone.   Objective: BP 118/74 mmHg  Pulse 93  Temp(Src) 99.1 F (37.3 C) (Oral)  Ht 5\' 6"  (1.676 m)  Wt 197 lb 12.8 oz (89.721 kg)  BMI 31.94 kg/m2  LMP 03/28/2015 Gen: Obese, well-appearing 34 y.o. female in no distress MSK: Pain to palpation of shoulders, thighs, lower back diffusely. No back spasm. Full ROM at shoulders, elbows, wrists, fingers, hips, knees, ankles, and toes. Greatly diminished black flexion and extension eliciting pain. No swelling, warmth or erythema in any joint.  Skin: Slight acanthosis and scattered skin tags noted. Hyperpigmentation (appears post-inflammatory) on palmar surfaces of right fingers. No swan neck deformity, though PIP, DIP and MCP joints are all mildly swollen on R hand (not left).    04/25/2008 21:22 05/30/2014 10:00  06/27/2014 17:23  Sed Rate 28 (H) 62 (H) 55 (H)  06/21/14 Anti-DNA neg  Anti-Smith neg Seen by Dr. 08/21/14 for hypersensitivity pneumonitis, work up was negative. Allergy full profile also negative (again).  06/17/14 ACE level 36 (wnl)  05/31/2014 ANA : positive 1:80 speckled  RF: neg ANCA screens negative APO Ab neg Serine protease 3 negative 02/23/14: Full allergy profile negative   Assessment/Plan: NALIYAH NETH is a 34 y.o. female here for widespread polyarthralgia and myalgia concerning for fibromyalgia vs. auto-immune arthritis.

## 2015-04-27 NOTE — Patient Instructions (Signed)
It was good to see you today,   I am going to defer further work up  because you need to be seen by Rheumatology. You should take these pain medications only when absolutely needed and otherwise take ibuprofen as we discussed because your condition is likely inflammatory and ibuprofen is an anti-inflammatory.   Make sure to keep your appointment, make an appointment with PCP for diabetes, and let us know if anything changes.

## 2015-04-27 NOTE — Assessment & Plan Note (Addendum)
History of rheumatology work up with only a mildly positive ANA, otherwise negative. Consideration given to ankylosing spondylitis given exam, also fibromyalgia. 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 No concern for severe flare at this time. Will treat with analgesics and encourage follow up with rheumatology. No new work up ordered (LSpine XR, HLA's, ESR/CRP, repeat RF/ANA, etc.) as I do not want redundant efforts and no steroids to avoid false negatives and will defer to rheumatology.

## 2015-04-30 DIAGNOSIS — Z794 Long term (current) use of insulin: Secondary | ICD-10-CM | POA: Diagnosis not present

## 2015-04-30 DIAGNOSIS — E119 Type 2 diabetes mellitus without complications: Secondary | ICD-10-CM | POA: Diagnosis not present

## 2015-04-30 LAB — HM DIABETES EYE EXAM

## 2015-05-01 ENCOUNTER — Telehealth: Payer: Self-pay | Admitting: Family Medicine

## 2015-05-01 NOTE — Telephone Encounter (Signed)
Recently referred to Rheumatology. This new appointment to establish was set up already and received fax as appointment reminder.  Called patient, unable to reach, LVM for [facility] stating the following apt reminder:  New apt for [facility] (ph: [phone]) - Rheumatology Dr. Elmer Ramp @ 05/15/15 at 1:30pm  If she calls back, please provide this new appointment reminder to her.  Saralyn Pilar, DO [facility], PGY-3

## 2015-05-04 ENCOUNTER — Ambulatory Visit (INDEPENDENT_AMBULATORY_CARE_PROVIDER_SITE_OTHER): Payer: Medicare Other | Admitting: Family Medicine

## 2015-05-04 ENCOUNTER — Encounter: Payer: Self-pay | Admitting: Family Medicine

## 2015-05-04 VITALS — BP 99/71 | HR 103 | Temp 98.5°F | Ht 66.0 in | Wt 194.9 lb

## 2015-05-04 DIAGNOSIS — E669 Obesity, unspecified: Secondary | ICD-10-CM | POA: Diagnosis not present

## 2015-05-04 DIAGNOSIS — E119 Type 2 diabetes mellitus without complications: Secondary | ICD-10-CM

## 2015-05-04 DIAGNOSIS — E785 Hyperlipidemia, unspecified: Secondary | ICD-10-CM | POA: Diagnosis not present

## 2015-05-04 DIAGNOSIS — E1169 Type 2 diabetes mellitus with other specified complication: Secondary | ICD-10-CM

## 2015-05-04 LAB — POCT GLYCOSYLATED HEMOGLOBIN (HGB A1C): HEMOGLOBIN A1C: 8.5

## 2015-05-04 MED ORDER — ATORVASTATIN CALCIUM 40 MG PO TABS
40.0000 mg | ORAL_TABLET | Freq: Every day | ORAL | Status: DC
Start: 2015-05-04 — End: 2016-02-20

## 2015-05-04 NOTE — Assessment & Plan Note (Addendum)
Significantly improved control. Off prednisone since 6/30 - Current A1c 8.5 (7/15) Last A1c >15 (02/15/15) - No complications or hypoglycemia - S/p Ophtho DM eye exam (negative for DM retinopathy) - 04/30/15  Plan: 1. Continue Lantus 35u, may titrate if persistent CBG >200 2. Continue Metformin 1000mg  BID, improved CHF, NYHA-I-II 3. Start atorvastatin 40mg  for high intensity statin in DM 4. Continue improved DM diet. Need to start exercise regimen as discussed 5. RTC 3 months for A1c

## 2015-05-04 NOTE — Patient Instructions (Addendum)
Dear Makayla Vasquez, Thank you for coming in to clinic today. It was good to see you again!  1. For your Diabetes - Your A1c is down from >15 to 8.5 today (Great job!) - Continue Lantus 35u daily, Metformin as you are - If your fasting CBG (in AM before food/drink) is >200 over half of days in next week, increase by 3 units - ONLY INCREASE EVERY 3 to 5 days  Start atorvastatin 40mg  daily - for reduce heartattack stroke risk, and reducing cholesterol  Please have new Rheumatologist send records to me.  Please schedule a follow-up appointment with Dr. in 3 to 6 months for follow-up Diabetes.  If you have any other questions or concerns, please feel free to call the clinic to contact me. You may also schedule an earlier appointment if necessary.  However, if your symptoms get significantly worse, please go to the Emergency Department to seek immediate medical attention.  Althea Charon, DO [facility]

## 2015-05-04 NOTE — Progress Notes (Signed)
   Subjective:    Patient ID: Makayla Vasquez, female    DOB: [birth year], 34 y.o.   MRN: [id number]  HPI  CHRONIC DM, Type 2, steroid induced - Today reports overall doing well since last DM visit 2 mo ago. Off previously chronic prednisone since 6/30. CBGs: Avg 110s, Low 80, High 150. Checks CBGs 1x daily (AM fasting), occasional PM if symptoms Meds: Lantus 35u daily (AM), Metformin 1000mg  BID, Cinnamon pill Reports good compliance. Tolerating well w/o side-effects Currently on ARB Lifestyle: Diet (continues to improve DM diet, reduce carbs), goal to start at gym with goal to lose some weight down to 180 lbs - Saw Dr. Ophtho (04/30/15) for DM eye exam, without any evidence of DM retinopathy Admits - improved polydipsia / polyuria Denies hypoglycemia, visual changes, numbness or tingling.   HLD: - Last lipid panel 10/2014 with low HDL 31 and elevated LDL 136 - previously atorvastatin tolerated well without side-effects or myalgias - agreeable to resuming statin today.  Chronic ILD with h/o chronic resp failure: - Followed by Dr. 11/2014 (Pulmonology), last visit 6/30. Discontinued Prednisone 5mg  PO qod had tapered down over past 1 year. Still using 2L O2 qhs only. Not needed during day with activities - Denies CP/SOB, edema, cough, fever/chills, cough  I have reviewed and updated the following as appropriate: allergies and current medications  Social Hx: - Former smoker  Review of Systems  See above HPI    Objective:   Physical Exam  BP 99/71 mmHg  Pulse 103  Temp(Src) 98.5 F (36.9 C) (Oral)  Ht 5\' 6"  (1.676 m)  Wt 194 lb 14.4 oz (88.406 kg)  BMI 31.47 kg/m2  LMP 03/28/2015  Gen - well-appearing, comfortable, NAD HEENT - oropharynx clear, MMM Heart - RRR, no murmurs heard Lungs - CTAB, no wheezing, crackles, rhonchi. Good air movement except mild diminished in bases. Non-labored. Abd - soft, NTND, active +BS Skin - warm, dry, no rashes     Assessment & Plan:     See specific A&P problem list for details.

## 2015-05-05 NOTE — Assessment & Plan Note (Signed)
HLD in DM2 with low HDL and high LDL - Previously tolerated statin, unclear why discontinued.  Plan: 1. Start Atorvastatin 40mg  daily for high intensity statin

## 2015-05-11 ENCOUNTER — Other Ambulatory Visit: Payer: Self-pay | Admitting: Cardiology

## 2015-05-11 ENCOUNTER — Emergency Department (HOSPITAL_COMMUNITY)
Admission: EM | Admit: 2015-05-11 | Discharge: 2015-05-11 | Disposition: A | Discharge status: Home / Self Care | Payer: Medicare Other | Attending: Emergency Medicine | Admitting: Emergency Medicine

## 2015-05-11 ENCOUNTER — Encounter (HOSPITAL_COMMUNITY): Payer: Self-pay | Admitting: *Deleted

## 2015-05-11 DIAGNOSIS — Z794 Long term (current) use of insulin: Secondary | ICD-10-CM | POA: Diagnosis not present

## 2015-05-11 DIAGNOSIS — Z950 Presence of cardiac pacemaker: Secondary | ICD-10-CM | POA: Insufficient documentation

## 2015-05-11 DIAGNOSIS — Z87891 Personal history of nicotine dependence: Secondary | ICD-10-CM | POA: Insufficient documentation

## 2015-05-11 DIAGNOSIS — M130 Polyarthritis, unspecified: Secondary | ICD-10-CM | POA: Diagnosis not present

## 2015-05-11 DIAGNOSIS — J45909 Unspecified asthma, uncomplicated: Secondary | ICD-10-CM | POA: Diagnosis not present

## 2015-05-11 DIAGNOSIS — E119 Type 2 diabetes mellitus without complications: Secondary | ICD-10-CM | POA: Insufficient documentation

## 2015-05-11 DIAGNOSIS — M25552 Pain in left hip: Secondary | ICD-10-CM | POA: Diagnosis not present

## 2015-05-11 DIAGNOSIS — I509 Heart failure, unspecified: Secondary | ICD-10-CM | POA: Insufficient documentation

## 2015-05-11 DIAGNOSIS — M25551 Pain in right hip: Secondary | ICD-10-CM | POA: Diagnosis not present

## 2015-05-11 DIAGNOSIS — M2550 Pain in unspecified joint: Secondary | ICD-10-CM | POA: Insufficient documentation

## 2015-05-11 DIAGNOSIS — Z8719 Personal history of other diseases of the digestive system: Secondary | ICD-10-CM | POA: Insufficient documentation

## 2015-05-11 DIAGNOSIS — I10 Essential (primary) hypertension: Secondary | ICD-10-CM | POA: Diagnosis not present

## 2015-05-11 DIAGNOSIS — E669 Obesity, unspecified: Secondary | ICD-10-CM | POA: Diagnosis not present

## 2015-05-11 DIAGNOSIS — Z8659 Personal history of other mental and behavioral disorders: Secondary | ICD-10-CM | POA: Insufficient documentation

## 2015-05-11 DIAGNOSIS — M545 Low back pain: Secondary | ICD-10-CM | POA: Insufficient documentation

## 2015-05-11 DIAGNOSIS — Z79899 Other long term (current) drug therapy: Secondary | ICD-10-CM | POA: Insufficient documentation

## 2015-05-11 MED ORDER — OXYCODONE-ACETAMINOPHEN 5-325 MG PO TABS
ORAL_TABLET | ORAL | Status: AC
  Filled 2015-05-11: qty 1

## 2015-05-11 MED ORDER — OXYCODONE-ACETAMINOPHEN 5-325 MG PO TABS
1.0000 | ORAL_TABLET | Freq: Once | ORAL | Status: AC
  Administered 2015-05-11: 1 via ORAL

## 2015-05-11 MED ORDER — OXYCODONE-ACETAMINOPHEN 5-325 MG PO TABS: 1.0000 | ORAL_TABLET | Freq: Four times a day (QID) | ORAL | Status: DC | PRN

## 2015-05-11 NOTE — ED Provider Notes (Signed)
CSN: [id number]     Arrival date & time 05/11/15  1525 History   First MD Initiated Contact with Patient 05/11/15 1604     Chief Complaint  Patient presents with  . Joint Pain     (Consider location/radiation/quality/duration/timing/severity/associated sxs/prior Treatment) HPI Comments: 34 year old female presenting with continued joint pain since being seen in the ED on 04/25/2015. Patient will be seeing rheumatology in 4 days to evaluate for an autoimmune disorder, possibly lupus. Both of her parents have lupus. No new pain since being seen in the ED previously. Pain located in both of her hips, right more so than left, lower back and neck. She was prescribed Vicodin with minimal relief of her pain. Given a Percocet on arrival to the ED with some relief of her pain. Pain described as a constant ache, worse when laying down or sitting, rated 9/10. No injury or trauma. She came to the ED today because her pain is not well controlled at home. No new symptoms since being evaluated at her last ED visit. Denies fevers, chest pain, shortness of breath, abdominal pain, bowel or bladder issues. On arrival, patient's O2 sat was 88%, after taking a few deep breaths, O2 sat greater than 91%. Patient is denying any shortness of breath and states she has a history of interstitial lung disease and wears oxygen as needed at night.  The history is provided by the patient.    Past Medical History  Diagnosis Date  . Cardiac pacemaker     a. Since age 39 in 17. b. Upgrade to BiV in 2013.  . Congenital complete AV block   . Obesity   . GERD (gastroesophageal reflux disease)   . Asymptomatic LV dysfunction     a. Echo in Dec 2011 with EF 35 to 40%. Felt to be due to paced rhythm. b. EF 25-30% in 07/2014.  . Seizures     as a child- from high fever  . Anxiety   . Bipolar affective disorder   . Depression     bipolar  . Carpal tunnel syndrome of right wrist   . Asthma     seasonal allergies   .  Arthritis     rheumatoid arthritis- mild, no rheumatology care   . Hypertension   . Pneumonitis     a. a/w hypoxia - inflammatory - large workup 07/2014.  [name] Sinus tachycardia   . Diabetes mellitus without complication   . Presence of permanent cardiac pacemaker   . CHF (congestive heart failure)    Past Surgical History  Procedure Laterality Date  . Throat surgery  1994    s/p laser treatment  . Cesarean section    . Insert / replace / remove pacemaker      2001  . Cholecystectomy    . Iud removal  11/03/2011    Procedure: INTRAUTERINE DEVICE (IUD) REMOVAL;  Surgeon: [name], MD;  Location: [facility];  Service: Gynecology;  Laterality: N/A;  . Cyst excision  12/10/2012    THYROID  . Thyroglossal duct cyst N/A 12/10/2012    Procedure: REVISION OF THYROGLOSSAL DUCT CYST EXCISION;  Surgeon: [name], MD;  Location: [place];  Service: ENT;  Laterality: N/A;  Revision of Thyroglossal Duct Cyst Excision  . Nasal fracture surgery      /w plate   . Carpal tunnel with cubital tunnel Right 07/26/2013    Procedure: RIGHT LIMITED OPEN CARPAL TUNNEL RELEASE ,  RIGHT CUBITAL TUNNEL RELEASE, INSITU VERSES ULNAR NERVE DECOMPRESSION  AND ANTERIOR TRANSPOSITION;  Surgeon: [name], MD;  Location: [place];  Service: Orthopedics;  Laterality: Right;  . Video bronchoscopy Bilateral 06/19/2014    Procedure: VIDEO BRONCHOSCOPY WITHOUT FLUORO;  Surgeon: Brand Males, MD;  Location: [place];  Service: Cardiopulmonary;  Laterality: Bilateral;  . Bi-ventricular pacemaker upgrade N/A 03/08/2012    Procedure: BI-VENTRICULAR PACEMAKER UPGRADE;  Surgeon: [name], MD;  Location: [facility] CATH LAB;  Service: Cardiovascular;  Laterality: N/A;  . Atrial tach ablation N/A 08/14/2014    Procedure: ATRIAL TACH ABLATION;  Surgeon: [name], MD;  Location: [facility] CATH LAB;  Service: Cardiovascular;  Laterality: N/A;  . Right heart catheterization N/A 10/26/2014    Procedure: RIGHT HEART CATH;  Surgeon: [name], MD;  Location: [facility] CATH LAB;  Service: Cardiovascular;  Laterality: N/A;   Family History  Problem Relation Age of Onset  . Heart disease Mother     CHF (no details)  . Hypertension Mother   . Heart disease Father     Murmur  . Heart disease Sister 37     No details.  History of a pacemaker  . Lupus Mother    History  Substance Use Topics  . Smoking status: Former Smoker -- 0.25 packs/day for .5 years    Types: Cigarettes    Quit date: 07/25/1996  . Smokeless tobacco: Never Used  . Alcohol Use: No   OB History    Gravida Para Term Preterm AB TAB SAB Ectopic Multiple Living   _0 Review of Systems  Musculoskeletal: Positive for back pain and arthralgias.  All other systems reviewed and are negative.     Allergies  Sertraline hcl and Tape  Home Medications   Prior to Admission medications   Medication Sig Start Date End Date Taking? Authorizing Provider  ACCU-CHEK SOFTCLIX LANCETS lancets Test blood sugar 3 times daily. 10/30/14   [name], DO  acetaminophen (TYLENOL) 500 MG tablet Take 1 tablet (500 mg total) by mouth 2 (two) times daily. 04/18/15   Elberta Leatherwood, MD  albuterol (PROVENTIL HFA;VENTOLIN HFA) 108 (90 BASE) MCG/ACT inhaler Inhale 2 puffs into the lungs every 4 (four) hours as needed for wheezing or shortness of breath. 06/16/14   [name], DO  atorvastatin (LIPITOR) 40 MG tablet Take 1 tablet (40 mg total) by mouth daily. 05/04/15   [name], DO  bisoprolol (ZEBETA) 5 MG tablet Take 0.5 tablets (2.5 mg total) by mouth daily. 01/05/15   [name], MD  bisoprolol (ZEBETA) 5 MG tablet TAKE 1/2 TABLET BY MOUTH DALY 03/29/15   [name], MD  Blood Glucose Monitoring Suppl (ONE TOUCH ULTRA 2) W/DEVICE KIT Check blood sugar daily before breakfast 10/25/14   Leone Brand, MD  budesonide-formoterol [facility]) 80-4.5 MCG/ACT inhaler Inhale 2 puffs into the lungs 2 (two) times daily as  needed (for shortness of breath).  04/06/14   Deneise Lever, MD  cetirizine (ZYRTEC) 10 MG tablet TAKE 1 TABLET (10 MG TOTAL) BY MOUTH DAILY. 04/16/15   [name], DO  etonogestrel (NEXPLANON) 68 MG IMPL implant 1 each by Subdermal route once.    Historical Provider, MD  fluocinonide (LIDEX) 0.05 % external solution 1 APPLICATION APPLY ON THE SKIN AS DIRECTED APPLY TO AFFECTED AREAS DAILY ON SCALP 01/30/15   Historical Provider, MD  furosemide (LASIX) 20 MG tablet TAKE 1 TABLET BY MOUTH EVERY  DAY FOR SHORTNESS OF BREATH AND/OR LEG SWELLING 04/24/15   [name], DO  glucose blood (ONE TOUCH ULTRA TEST) test strip Check blood sugar daily before breakfast 10/25/14   Leone Brand, MD  HYDROcodone-acetaminophen [facility]) 10-325 MG per tablet Take 1 tablet by mouth every 8 (eight) hours as needed. 04/27/15   Patrecia Pour, MD  hydrocortisone 2.5 % cream APPLY TO AFFECTED AREA AS DIRECTED 01/30/15   Historical Provider, MD  Insulin Glargine (LANTUS SOLOSTAR) 100 UNIT/ML Solostar Pen Inject 35-50 units subcutaneously daily, as instructed. 02/16/15   [name], DO  Insulin Pen Needle (INSUPEN PEN NEEDLES) 32G X 4 MM MISC Inject insulin via insulin pen 1-2 x daily 11/03/14   [name], DO  losartan (COZAAR) 25 MG tablet TAKE 1 TABLET BY MOUTH 2 TIMES DAILY. 05/11/15   [name], MD  metFORMIN (GLUCOPHAGE) 1000 MG tablet Take 1 tablet (1,000 mg total) by mouth 2 (two) times daily with a meal. 03/12/15   [name], DO  Nutritional Supplements (GLUCERNA ADVANCE SHAKE) LIQD Take supplement daily in place of 1 meal. 11/03/14   [name], DO  oxyCODONE-acetaminophen (PERCOCET) 5-325 MG per tablet Take 1-2 tablets by mouth every 6 (six) hours as needed for severe pain. 05/11/15   Carman Ching, PA-C  potassium chloride SA (KLOR-CON M20) 20 MEQ tablet TAKE 40 MEQ TODAY (2 TABLETS) THEN TAKE 20 MEQ ON DAYS THAT LASIX IS TAKEN 02/12/15   [name], MD  spironolactone (ALDACTONE) 25 MG tablet TAKE 1 TABLET BY MOUTH EVERY DAY 04/24/15   Shaune Pascal Bensimhon, MD   BP 115/75 mmHg  Pulse 101  Temp(Src) 99.6 F (37.6 C) (Oral)  Resp 20  Ht $R'5\' 6"'sM$  (1.676 m)  Wt 194 lb (87.998 kg)  BMI 31.33 kg/m2  SpO2 92%  LMP 03/28/2015 (Exact Date) Physical Exam  Constitutional: She is oriented to person, place, and time. She appears well-developed and well-nourished. No distress.  HENT:  Head: Normocephalic and atraumatic.  Mouth/Throat: Oropharynx is clear and moist.  Eyes: Conjunctivae are normal.  Neck: Normal range of motion. Neck supple. No spinous process tenderness and no muscular tenderness present.  Cardiovascular: Normal rate, regular rhythm and normal heart sounds.   Pulmonary/Chest: Effort normal and breath sounds normal. No respiratory distress.  Musculoskeletal: Normal range of motion. She exhibits no edema.  BL hips TTP laterally. FROM with mild pain. No swelling or deformity. Mild TTP lower lumbar spine. No edema. FROM. T-spine/C-spine normal.  Neurological: She is alert and oriented to person, place, and time. She has normal strength. Gait normal.  Strength upper and lower extremities 5/5 and equal bilateral. Sensation intact.  Skin: Skin is warm and dry. No rash noted. She is not diaphoretic.  Psychiatric: She has a normal mood and affect. Her behavior is normal.  Nursing note and vitals reviewed.   ED Course  Procedures (including critical care time) Labs Review Labs Reviewed - No data to display  Imaging Review No results found.   EKG Interpretation None      MDM   Final diagnoses:  Polyarthralgia   Non-toxic appearing, NAD. AFVSS. HR noted to be 101 in triage, no tachycardia on my exam. Pt c/o joint pain, without any new symptoms from prior visit. Will be seen by rheumatology in 4 days. Here for better pain control. Exam without any erythema, edema, warmth or deformity. Doubt joint infection or injury.  These are ongoing symptoms. I do  not feel labs are necessary today. On review of controlled substance database, the patient has only had one prescription within the past 6 months for Vicodin. Will give a prescription for Percocet rather than Vicodin, 12 tablets written. I discussed importance of keeping the appointment with rheumatology and to also follow-up with her primary care physician. Stable for discharge. Return precautions given. Patient states understanding of treatment care plan and is agreeable.  Carman Ching, PA-C 05/11/15 1631  Noemi Chapel, MD 05/12/15 1040

## 2015-05-11 NOTE — ED Notes (Addendum)
Pt c/o full body joint pain for weeks. Pt denies any recent injury, falls, or MVC. Pt states nothing makes her pain feel better, and lying/sitting makes her pain feel worse. Pt's initial O2 sats 88%, instructed to take a couple deep breath, pt's O2 sats consistently >91%. Pt wears O2 at home as needed.

## 2015-05-11 NOTE — Discharge Instructions (Signed)
Take percocet for severe pain only. No driving or operating heavy machinery while taking percocet. This medication may cause drowsiness. Follow up with the rheumatologist as scheduled. Also follow up with your primary care doctor.  Arthralgia Your caregiver has diagnosed you as suffering from an arthralgia. Arthralgia means there is pain in a joint. This can come from many reasons including:  Bruising the joint which causes soreness (inflammation) in the joint.  Wear and tear on the joints which occur as we grow older (osteoarthritis).  Overusing the joint.  Various forms of arthritis.  Infections of the joint. Regardless of the cause of pain in your joint, most of these different pains respond to anti-inflammatory drugs and rest. The exception to this is when a joint is infected, and these cases are treated with antibiotics, if it is a bacterial infection. HOME CARE INSTRUCTIONS   Rest the injured area for as long as directed by your caregiver. Then slowly start using the joint as directed by your caregiver and as the pain allows. Crutches as directed may be useful if the ankles, knees or hips are involved. If the knee was splinted or casted, continue use and care as directed. If an stretchy or elastic wrapping bandage has been applied today, it should be removed and re-applied every 3 to 4 hours. It should not be applied tightly, but firmly enough to keep swelling down. Watch toes and feet for swelling, bluish discoloration, coldness, numbness or excessive pain. If any of these problems (symptoms) occur, remove the ace bandage and re-apply more loosely. If these symptoms persist, contact your caregiver or return to this location.  For the first 24 hours, keep the injured extremity elevated on pillows while lying down.  Apply ice for 15-20 minutes to the sore joint every couple hours while awake for the first half day. Then 03-04 times per day for the first 48 hours. Put the ice in a plastic  bag and place a towel between the bag of ice and your skin.  Wear any splinting, casting, elastic bandage applications, or slings as instructed.  Only take over-the-counter or prescription medicines for pain, discomfort, or fever as directed by your caregiver. Do not use aspirin immediately after the injury unless instructed by your physician. Aspirin can cause increased bleeding and bruising of the tissues.  If you were given crutches, continue to use them as instructed and do not resume weight bearing on the sore joint until instructed. Persistent pain and inability to use the sore joint as directed for more than 2 to 3 days are warning signs indicating that you should see a caregiver for a follow-up visit as soon as possible. Initially, a hairline fracture (break in bone) may not be evident on X-rays. Persistent pain and swelling indicate that further evaluation, non-weight bearing or use of the joint (use of crutches or slings as instructed), or further X-rays are indicated. X-rays may sometimes not show a small fracture until a week or 10 days later. Make a follow-up appointment with your own caregiver or one to whom we have referred you. A radiologist (specialist in reading X-rays) may read your X-rays. Make sure you know how you are to obtain your X-ray results. Do not assume everything is normal if you do not hear from [place]. SEEK MEDICAL CARE IF: Bruising, swelling, or pain increases. SEEK IMMEDIATE MEDICAL CARE IF:   Your fingers or toes are numb or blue.  The pain is not responding to medications and continues to stay the  same or get worse.  The pain in your joint becomes severe.  You develop a fever over 102 F (38.9 C).  It becomes impossible to move or use the joint. MAKE SURE YOU:   Understand these instructions.  Will watch your condition.  Will get help right away if you are not doing well or get worse. Document Released: 10/06/2005 Document Revised: 12/29/2011 Document  Reviewed: 05/24/2008 [facility] Patient Information 2015 [place], [place]. This information is not intended to replace advice given to you by your health care provider. Make sure you discuss any questions you have with your health care provider.

## 2015-05-14 ENCOUNTER — Encounter: Payer: Medicare Other | Admitting: Internal Medicine

## 2015-05-15 ENCOUNTER — Encounter: Payer: Self-pay | Admitting: Internal Medicine

## 2015-05-15 ENCOUNTER — Encounter: Payer: Medicare Other | Admitting: Internal Medicine

## 2015-05-15 DIAGNOSIS — R768 Other specified abnormal immunological findings in serum: Secondary | ICD-10-CM | POA: Diagnosis not present

## 2015-05-15 DIAGNOSIS — M2550 Pain in unspecified joint: Secondary | ICD-10-CM | POA: Diagnosis not present

## 2015-05-15 DIAGNOSIS — J849 Interstitial pulmonary disease, unspecified: Secondary | ICD-10-CM | POA: Diagnosis not present

## 2015-05-15 DIAGNOSIS — R21 Rash and other nonspecific skin eruption: Secondary | ICD-10-CM | POA: Diagnosis not present

## 2015-05-15 DIAGNOSIS — R5383 Other fatigue: Secondary | ICD-10-CM | POA: Diagnosis not present

## 2015-05-18 ENCOUNTER — Other Ambulatory Visit: Payer: Self-pay | Admitting: Family Medicine

## 2015-05-18 DIAGNOSIS — E669 Obesity, unspecified: Secondary | ICD-10-CM

## 2015-05-18 DIAGNOSIS — I5022 Chronic systolic (congestive) heart failure: Secondary | ICD-10-CM

## 2015-05-18 DIAGNOSIS — E1169 Type 2 diabetes mellitus with other specified complication: Secondary | ICD-10-CM

## 2015-05-21 DIAGNOSIS — M351 Other overlap syndromes: Secondary | ICD-10-CM | POA: Diagnosis not present

## 2015-05-21 DIAGNOSIS — J849 Interstitial pulmonary disease, unspecified: Secondary | ICD-10-CM | POA: Diagnosis not present

## 2015-05-21 DIAGNOSIS — R21 Rash and other nonspecific skin eruption: Secondary | ICD-10-CM | POA: Diagnosis not present

## 2015-06-05 ENCOUNTER — Ambulatory Visit (INDEPENDENT_AMBULATORY_CARE_PROVIDER_SITE_OTHER): Payer: Medicare Other | Admitting: Family Medicine

## 2015-06-05 ENCOUNTER — Encounter: Payer: Self-pay | Admitting: Family Medicine

## 2015-06-05 VITALS — BP 97/55 | HR 132 | Temp 98.5°F | Wt 191.0 lb

## 2015-06-05 DIAGNOSIS — R5383 Other fatigue: Secondary | ICD-10-CM

## 2015-06-05 DIAGNOSIS — M2550 Pain in unspecified joint: Secondary | ICD-10-CM | POA: Diagnosis not present

## 2015-06-05 DIAGNOSIS — J4520 Mild intermittent asthma, uncomplicated: Secondary | ICD-10-CM | POA: Diagnosis not present

## 2015-06-05 DIAGNOSIS — J849 Interstitial pulmonary disease, unspecified: Secondary | ICD-10-CM

## 2015-06-05 DIAGNOSIS — R0602 Shortness of breath: Secondary | ICD-10-CM | POA: Diagnosis not present

## 2015-06-05 DIAGNOSIS — R112 Nausea with vomiting, unspecified: Secondary | ICD-10-CM | POA: Diagnosis not present

## 2015-06-05 MED ORDER — PREDNISONE 10 MG PO TABS: ORAL_TABLET | ORAL | Status: DC

## 2015-06-05 MED ORDER — PROMETHAZINE HCL 25 MG PO TABS: 25.0000 mg | ORAL_TABLET | Freq: Four times a day (QID) | ORAL | Status: DC | PRN

## 2015-06-05 MED ORDER — ALBUTEROL SULFATE HFA 108 (90 BASE) MCG/ACT IN AERS: 2.0000 | INHALATION_SPRAY | RESPIRATORY_TRACT | Status: DC | PRN

## 2015-06-05 MED ORDER — OXYCODONE-ACETAMINOPHEN 5-325 MG PO TABS: 1.0000 | ORAL_TABLET | Freq: Four times a day (QID) | ORAL | Status: DC | PRN

## 2015-06-05 NOTE — Patient Instructions (Signed)
Dear Makayla Vasquez, Thank you for coming in to clinic today. It was good to see you!  1. For your symptoms, we will start Prednisone taper for 2 weeks (take 4 tabs for 4 days, 3 tabs for 4 days, 2 tabs for 4 days, then 1 tab for 4 days) then stop, if needed may need to take 5mg  every other day as needed 2. Take Percocet as needed for pain. Try Phenergan for nausea - Stay well hydrated - Keep close watch on blood sugars 3. Your Rheumatologist Dr. - talked to his nurse, Joy, she states that Cellcept (Mycophenolate 500mg ) was sent in to your [facility] today, take 1 tablet twice daily as instructed. - Next follow-up on 06/18/15 at 10:30am with Dr. - They will send me records so I can update your chart. 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 06/20/15 in 1 month for follow-up, sooner as needed  If you have any other questions or concerns, please feel free to call the clinic to contact me. You may also schedule an earlier appointment if necessary.  However, if your symptoms get significantly worse, please go to the Emergency Department to seek immediate medical attention.  Elmer Ramp, DO [facility]

## 2015-06-05 NOTE — Progress Notes (Addendum)
Subjective:    Patient ID: Makayla Vasquez, female    DOB: 1980-10-30, 34 y.o.   MRN: [id number]  Patient presents for a same day appointment.  Makayla Vasquez is a 34 y.o. female presenting on 06/05/2015 for Fatigue; nausea and vomiting; and Chills  HPI  CHRONIC PAIN / POLYARTHRALGIA / GENERALIZED FATIGUE, WEAKNESS: - Recent course with similar symptoms initially only started with generalized polyarthralgias early 04/2015, similarly timed with discontinued chronic prednisone 81m qod (04/19/15) by pulmonology for her chronic ILD. Developed arthralgias with multiple joints low back, both hips, neck, shoulders. [facility], notable with history of positive ANA previously during prior hospitalization, referred to Rheumatology. Visited ED for same complaints 7/6 and 7/22, given pain medicine with relief. - Newly established with Rheumatology with Dr. VKandice Robinsonson 05/15/15, ([facility], reportedly states she was diagnosed with "lupus and connective tissue disease", however I have not seen this report, and she states medicine was sent in but has not received it from pharmacy, called back and unable to coordinate with them. - Of note family history significant with Mother having similar constellation of symptoms with severe cardiac problems, questionable autoimmune with SLE. - Current symptoms started getting worse about 2 weeks ago with generalized fatigue and weakness, still with above mentioned joint pain. Now associated with nausea, occasional vomiting, chills - Occasional SOB with coughing, intermittent right sided chest pressure but no active CP - Previously on Percocet for pain, from ED was helping now out of it. Not taking Tylenol, Ibuprofen - Using up to 4L home O2 as needed, previously only on 2L at night  CHRONIC DM2 Blood sugars have been controlled, 90 to 120, no significant episodes Lantus 35u daily  Chart review labs: 06/21/14 Anti-DNA neg   Anti-Smith neg Seen by Dr. YAnnamaria Bootsfor hypersensitivity pneumonitis, work up was negative. Allergy full profile also negative (again).  06/17/14 ACE level 36 (wnl)  05/31/2014 ANA : positive 1:80 speckled  RF: neg ANCA screens negative APO Ab neg Serine protease 3 negative 02/23/14: Full allergy profile negative   Past Medical History  Diagnosis Date  . Cardiac pacemaker     a. Since age [age]in 1105 b. Upgrade to BiV in 2013.  . Congenital complete AV block   . Obesity   . GERD (gastroesophageal reflux disease)   . Asymptomatic LV dysfunction     a. Echo in Dec 2011 with EF 35 to 40%. Felt to be due to paced rhythm. b. EF 25-30% in 07/2014.  . Seizures     as a child- from high fever  . Anxiety   . Bipolar affective disorder   . Depression     bipolar  . Carpal tunnel syndrome of right wrist   . Asthma     seasonal allergies   . Arthritis     rheumatoid arthritis- mild, no rheumatology care   . Hypertension   . Pneumonitis     a. a/w hypoxia - inflammatory - large workup 07/2014.  .Marland KitchenSinus tachycardia   . Diabetes mellitus without complication   . Presence of permanent cardiac pacemaker   . CHF (congestive heart failure)     Current Outpatient Prescriptions on File Prior to Visit  Medication Sig  . ACCU-CHEK SOFTCLIX LANCETS lancets Test blood sugar 3 times daily.  .Marland Kitchenacetaminophen (TYLENOL) 500 MG tablet Take 1 tablet (500 mg total) by mouth 2 (two) times daily.  .Marland Kitchenatorvastatin (LIPITOR) 40 MG tablet Take  1 tablet (40 mg total) by mouth daily.  . bisoprolol (ZEBETA) 5 MG tablet Take 0.5 tablets (2.5 mg total) by mouth daily.  . bisoprolol (ZEBETA) 5 MG tablet TAKE 1/2 TABLET BY MOUTH DALY  . Blood Glucose Monitoring Suppl (ONE TOUCH ULTRA 2) W/DEVICE KIT Check blood sugar daily before breakfast  . budesonide-formoterol (SYMBICORT) 80-4.5 MCG/ACT inhaler Inhale 2 puffs into the lungs 2 (two) times daily as needed (for shortness of breath).   . cetirizine (ZYRTEC) 10 MG  tablet TAKE 1 TABLET (10 MG TOTAL) BY MOUTH DAILY.  [name] etonogestrel (NEXPLANON) 68 MG IMPL implant 1 each by Subdermal route once.  . fluocinonide (LIDEX) 0.05 % external solution 1 APPLICATION APPLY ON THE SKIN AS DIRECTED APPLY TO AFFECTED AREAS DAILY ON SCALP  . furosemide (LASIX) 20 MG tablet TAKE 1 TABLET BY MOUTH EVERY DAY FOR SHORTNESS OF BREATH AND/OR LEG SWELLING  . glucose blood (ONE TOUCH ULTRA TEST) test strip Check blood sugar daily before breakfast  . HYDROcodone-acetaminophen (NORCO) 10-325 MG per tablet Take 1 tablet by mouth every 8 (eight) hours as needed.  . hydrocortisone 2.5 % cream APPLY TO AFFECTED AREA AS DIRECTED  . Insulin Glargine (LANTUS SOLOSTAR) 100 UNIT/ML Solostar Pen Inject 35-50 units subcutaneously daily, as instructed.  . Insulin Pen Needle (INSUPEN PEN NEEDLES) 32G X 4 MM MISC Inject insulin via insulin pen 1-2 x daily  . losartan (COZAAR) 25 MG tablet TAKE 1 TABLET BY MOUTH 2 TIMES DAILY.  . metFORMIN (GLUCOPHAGE) 1000 MG tablet TAKE 1 TABLET BY MOUTH TWICE A DAY WITH A MEAL  . Nutritional Supplements (GLUCERNA ADVANCE SHAKE) LIQD Take supplement daily in place of 1 meal.  . potassium chloride SA (KLOR-CON M20) 20 MEQ tablet TAKE 40 MEQ TODAY (2 TABLETS) THEN TAKE 20 MEQ ON DAYS THAT LASIX IS TAKEN  . spironolactone (ALDACTONE) 25 MG tablet TAKE 1 TABLET BY MOUTH EVERY DAY   No current facility-administered medications on file prior to visit.    Review of Systems  Constitutional: Positive for chills, activity change (decreased activity), appetite change (reduced PO food, tolerating liquids) and fatigue (generalized). Negative for fever and diaphoresis.  HENT: Negative for congestion, ear discharge, ear pain, rhinorrhea, sinus pressure and sore throat.   Eyes: Negative for pain, redness and visual disturbance.  Respiratory: Positive for cough (occasionally productive) and chest tightness (Not active. intermittent). Negative for wheezing.   Cardiovascular:  Positive for palpitations. Negative for chest pain and leg swelling.  Gastrointestinal: Positive for nausea, vomiting (occasional not daily, 1-2x over past 2 weeks) and abdominal pain (generalized cramping, episodic). Negative for diarrhea, constipation and blood in stool.  Endocrine: Negative for polydipsia and polyuria.  Genitourinary: Negative for dysuria, urgency, frequency, hematuria, flank pain and difficulty urinating.  Musculoskeletal: Positive for back pain, arthralgias (multiple joints, back, neck, shoulders, knees) and neck pain. Negative for myalgias, joint swelling and neck stiffness.  Skin: Negative for rash (dry skin).  Neurological: Positive for weakness (generalized) and headaches (frontal). Negative for dizziness and numbness.  Hematological: Negative for adenopathy.   Per HPI unless specifically indicated above     Objective:    BP 97/55 mmHg  Pulse 132  Temp(Src) 98.5 F (36.9 C) (Oral)  Wt 191 lb (86.637 kg)  SpO2 92%  LMP 03/28/2015 (Exact Date)  Wt Readings from Last 3 Encounters:  06/05/15 191 lb (86.637 kg)  05/11/15 194 lb (87.998 kg)  05/04/15 194 lb 14.4 oz (88.406 kg)    Physical Exam  Constitutional: She  is oriented to person, place, and time. She appears well-developed and well-nourished. She appears distressed (mild discomfort, resting head in her hands).  HENT:  Head: Normocephalic and atraumatic.  Right Ear: External ear normal.  Left Ear: External ear normal.  Nose: Nose normal.  Oropharynx clear with mild dry MM and tongue  Eyes: Conjunctivae and EOM are normal. Pupils are equal, round, and reactive to light.  Neck: Normal range of motion. Neck supple. No thyromegaly present.  Cardiovascular: Regular rhythm, normal heart sounds and intact distal pulses.   No murmur heard. tachycardic  Pulmonary/Chest: Effort normal. No respiratory distress. She has wheezes (scattered). She has rales. She exhibits no tenderness.  Good air movement, speaks  full sentences  Abdominal: Soft. Bowel sounds are normal. She exhibits no distension and no mass. There is tenderness (generalized not reproduced by palpation). There is no rebound and no guarding.  Musculoskeletal: Normal range of motion. She exhibits no edema or tenderness.  Lymphadenopathy:    She has no cervical adenopathy.  Neurological: She is alert and oriented to person, place, and time.  Skin: Skin is warm and dry. No rash noted. She is not diaphoretic. No erythema.  Nursing note and vitals reviewed.  Results for orders placed or performed in visit on 05/04/15  POCT A1C  Result Value Ref Range   Hemoglobin A1C 8.5       Assessment & Plan:   Problem List Items Addressed This Visit      Respiratory   ILD (interstitial lung disease)    Continue use O2 PRN, seems to have needed inc amount. Now established with Rheum (Dr. Kandice Robinsons) suspects current polyarthralgia and symptoms are from known chronic ILD with overlap syndrome. - Stopped Prednisone 5 qod on 6/30, seems to be trigger for worsening symptoms  Plan: 1. Start prednisone burst/taper over 2 weeks, 10 mg tabs, 15m x 4 days, 30 x 4, 20 x 4, 10 x 4, then off, may need 5 qod (has existing rx) 2. Refilled Albuterol 3. Continue use O2 4. Follow-up with Pulm (Dr. YAnnamaria Boots sooner if not improving on prednisone      Relevant Medications   promethazine (PHENERGAN) 25 MG tablet   albuterol (PROVENTIL HFA;VENTOLIN HFA) 108 (90 BASE) MCG/ACT inhaler   Mild intermittent asthma without complication    Refilled Albuterol, use PRN      Relevant Medications   predniSONE (DELTASONE) 10 MG tablet   albuterol (PROVENTIL HFA;VENTOLIN HFA) 108 (90 BASE) MCG/ACT inhaler     Digestive   Nausea with vomiting   Relevant Medications   promethazine (PHENERGAN) 25 MG tablet     Other   Fatigue    Given constellation of symptoms, consider possible adrenal suppression from previous chronic prednisone, symptoms worsening after discontinued 5  qod. Will treat with burst/taper prednisone, follow-up with Rheum. If still worsening after starting cellcept and off prednisone, will resume chronic 552mqod and start work-up with cortisol testing.      Relevant Medications   predniSONE (DELTASONE) 10 MG tablet   Polyarthralgia - Primary    Persistent worsening chronic polyarthralgia pains, now worsening with generalized fatigue/weakness, n/v. Suspect may be related to being off prednisone (DCHiram/30/16 by PuFatima Sanger5 qod)  Established with Rheumatology (Dr. ViKandice RobinsonsGrWilkesboronurse is Joy) - 05/15/15 initial visit, requested records, will update once received, per report of nurse, pt dx with known CILD with overlap syndrome, started on trial Cellcept (Mycophenalate) 5009mID, has not started yet due to not sent to  pharmacy, start today 8/16  Plan: 1. Called Dr. Claudette Laws office, see above, requested record. Nurse Joy sent in rx Cellcept now, pt to pick up today and start. Okay to take with Prednisone 2. Start Prednisone taper 40 x 4 day, 30 x 4, 20 x 4, 10 x 4, off, and can use 5 qod if not improved 3. Refilled Percocet #60, x 1 month 4. Follow-up with Rheum 06/18/15        Relevant Medications   oxyCODONE-acetaminophen (PERCOCET) 5-325 MG per tablet   predniSONE (DELTASONE) 10 MG tablet   Shortness of breath    Use O2, Albuterol PRN - Should improve with Prednisone taper      Relevant Medications   predniSONE (DELTASONE) 10 MG tablet      Meds ordered this encounter  Medications  . oxyCODONE-acetaminophen (PERCOCET) 5-325 MG per tablet    Sig: Take 1 tablet by mouth every 6 (six) hours as needed for severe pain.    Dispense:  60 tablet    Refill:  0  . predniSONE (DELTASONE) 10 MG tablet    Sig: Take 4 tabs for 4 days, then take 3 for 4 days, then 2 for 4 days, then 1 for 4 days. May resume home 30m every other day after completion    Dispense:  40 tablet    Refill:  0  . promethazine (PHENERGAN) 25 MG tablet     Sig: Take 1 tablet (25 mg total) by mouth every 6 (six) hours as needed for nausea or vomiting.    Dispense:  12 tablet    Refill:  0  . albuterol (PROVENTIL HFA;VENTOLIN HFA) 108 (90 BASE) MCG/ACT inhaler    Sig: Inhale 2 puffs into the lungs every 4 (four) hours as needed for wheezing or shortness of breath.    Dispense:  1 each    Refill:  1  . mycophenolate (CELLCEPT) 500 MG tablet    Sig: Take 500 mg by mouth 2 (two) times daily.      Follow up plan: Return in about 2 weeks (around 06/19/2015), or if symptoms worsen or fail to improve.   ANobie Putnam DGreenleaf PGY-3

## 2015-06-05 NOTE — Assessment & Plan Note (Addendum)
Use O2, Albuterol PRN - Should improve with Prednisone taper

## 2015-06-05 NOTE — Assessment & Plan Note (Signed)
Given constellation of symptoms, consider possible adrenal suppression from previous chronic prednisone, symptoms worsening after discontinued 5 qod. Will treat with burst/taper prednisone, follow-up with Rheum. If still worsening after starting cellcept and off prednisone, will resume chronic 5mg  qod and start work-up with cortisol testing.

## 2015-06-05 NOTE — Assessment & Plan Note (Signed)
Refilled Albuterol, use PRN

## 2015-06-05 NOTE — Assessment & Plan Note (Signed)
Continue use O2 PRN, seems to have needed inc amount. Now established with Rheum (Dr. Elmer Ramp) suspects current polyarthralgia and symptoms are from known chronic ILD with overlap syndrome. - Stopped Prednisone 5 qod on 6/30, seems to be trigger for worsening symptoms  Plan: 1. Start prednisone burst/taper over 2 weeks, 10 mg tabs, 40mg  x 4 days, 30 x 4, 20 x 4, 10 x 4, then off, may need 5 qod (has existing rx) 2. Refilled Albuterol 3. Continue use O2 4. Follow-up with Pulm (Dr. ) sooner if not improving on prednisone

## 2015-06-05 NOTE — Assessment & Plan Note (Signed)
Persistent worsening chronic polyarthralgia pains, now worsening with generalized fatigue/weakness, n/v. Suspect may be related to being off prednisone (DC'd 04/19/15 by Erline Hau, 5 qod)  Established with Rheumatology (Dr. Elmer Ramp, [facility], nurse is Joy) - 05/15/15 initial visit, requested records, will update once received, per report of nurse, pt dx with known CILD with overlap syndrome, started on trial Cellcept (Mycophenalate) 500mg  BID, has not started yet due to not sent to pharmacy, start today 8/16  Plan: 1. Called Dr. 9/16 office, see above, requested record. Nurse Joy sent in rx Cellcept now, pt to pick up today and start. Okay to take with Prednisone 2. Start Prednisone taper 40 x 4 day, 30 x 4, 20 x 4, 10 x 4, off, and can use 5 qod if not improved 3. Refilled Percocet #60, x 1 month 4. Follow-up with Rheum 06/18/15

## 2015-06-18 DIAGNOSIS — M351 Other overlap syndromes: Secondary | ICD-10-CM | POA: Diagnosis not present

## 2015-06-18 DIAGNOSIS — J849 Interstitial pulmonary disease, unspecified: Secondary | ICD-10-CM | POA: Diagnosis not present

## 2015-06-18 DIAGNOSIS — R21 Rash and other nonspecific skin eruption: Secondary | ICD-10-CM | POA: Diagnosis not present

## 2015-06-27 ENCOUNTER — Encounter: Payer: Self-pay | Admitting: Internal Medicine

## 2015-06-27 ENCOUNTER — Ambulatory Visit (INDEPENDENT_AMBULATORY_CARE_PROVIDER_SITE_OTHER): Payer: Medicare Other | Admitting: Internal Medicine

## 2015-06-27 VITALS — BP 98/74 | HR 111 | Ht 65.0 in | Wt 187.4 lb

## 2015-06-27 DIAGNOSIS — J9611 Chronic respiratory failure with hypoxia: Secondary | ICD-10-CM

## 2015-06-27 DIAGNOSIS — Q246 Congenital heart block: Secondary | ICD-10-CM

## 2015-06-27 DIAGNOSIS — I5022 Chronic systolic (congestive) heart failure: Secondary | ICD-10-CM

## 2015-06-27 DIAGNOSIS — I10 Essential (primary) hypertension: Secondary | ICD-10-CM

## 2015-06-27 DIAGNOSIS — Z950 Presence of cardiac pacemaker: Secondary | ICD-10-CM | POA: Diagnosis not present

## 2015-06-27 LAB — CUP PACEART INCLINIC DEVICE CHECK
Battery Voltage: 2.98 V
Brady Statistic AP VP Percent: 2.38 %
Brady Statistic AP VS Percent: 0.01 %
Brady Statistic AS VP Percent: 97.47 %
Brady Statistic AS VS Percent: 0.14 %
Brady Statistic RA Percent Paced: 2.39 %
Brady Statistic RV Percent Paced: 99.85 %
Date Time Interrogation Session: 20160907184232
Lead Channel Impedance Value: 247 Ohm
Lead Channel Impedance Value: 342 Ohm
Lead Channel Impedance Value: 361 Ohm
Lead Channel Impedance Value: 418 Ohm
Lead Channel Impedance Value: 532 Ohm
Lead Channel Pacing Threshold Amplitude: 0.75 V
Lead Channel Pacing Threshold Amplitude: 0.75 V
Lead Channel Pacing Threshold Pulse Width: 0.4 ms
Lead Channel Pacing Threshold Pulse Width: 0.4 ms
Lead Channel Sensing Intrinsic Amplitude: 4.75 mV
Lead Channel Setting Pacing Amplitude: 2 V
Lead Channel Setting Pacing Amplitude: 2.5 V
Lead Channel Setting Sensing Sensitivity: 0.9 mV
MDC IDC MSMT BATTERY REMAINING LONGEVITY: 40 mo
MDC IDC MSMT LEADCHNL LV IMPEDANCE VALUE: 304 Ohm
MDC IDC MSMT LEADCHNL LV PACING THRESHOLD PULSEWIDTH: 0.4 ms
MDC IDC MSMT LEADCHNL RA IMPEDANCE VALUE: 285 Ohm
MDC IDC MSMT LEADCHNL RA IMPEDANCE VALUE: 304 Ohm
MDC IDC MSMT LEADCHNL RA SENSING INTR AMPL: 4.625 mV
MDC IDC MSMT LEADCHNL RV IMPEDANCE VALUE: 228 Ohm
MDC IDC MSMT LEADCHNL RV PACING THRESHOLD AMPLITUDE: 0.75 V
MDC IDC SET LEADCHNL LV PACING PULSEWIDTH: 0.4 ms
MDC IDC SET LEADCHNL RV PACING AMPLITUDE: 2.75 V
MDC IDC SET LEADCHNL RV PACING PULSEWIDTH: 0.4 ms
MDC IDC SET ZONE DETECTION INTERVAL: 330 ms
Zone Setting Detection Interval: 400 ms

## 2015-06-27 NOTE — Assessment & Plan Note (Signed)
Her symptoms remain class 2B-3A. No change in medical therapy. I have encouraged the patient to reduce her sodium intake as her use of steroids for her lupus will make her more likely to retain fluid.

## 2015-06-27 NOTE — Progress Notes (Signed)
HPI Mrs. Makayla Vasquez returns today for follow-up. She is a very pleasant 34 year old woman with chronic systolic heart failure, high-grade heart block, lung disease, and is status post biventricular pacemaker insertion. When I saw the patient last several months ago, she had worsening heart failure symptoms, and her ejection fraction was 25%. It was my impression at the time that she was having runs of atrial tachycardia. She underwent electrophysiologic testing which demonstrated only sinus tachycardia. We have been limited in use of beta blocker with the patient because of underlying lung problems. She has not had syncope. She is taking home oxygen. She has been diagnosed with Lupus and is on medical therapy.  Allergies  Allergen Reactions  . Sertraline Hcl Hives  . Tape Other (See Comments)    Burns skin     Current Outpatient Prescriptions  Medication Sig Dispense Refill  . acetaminophen (TYLENOL) 500 MG tablet Take 1 tablet (500 mg total) by mouth 2 (two) times daily. 10 tablet 0  . albuterol (PROVENTIL HFA;VENTOLIN HFA) 108 (90 BASE) MCG/ACT inhaler Inhale 2 puffs into the lungs every 4 (four) hours as needed for wheezing or shortness of breath. 1 each 1  . atorvastatin (LIPITOR) 40 MG tablet Take 1 tablet (40 mg total) by mouth daily. 90 tablet 3  . bisoprolol (ZEBETA) 5 MG tablet TAKE 1/2 TABLET BY MOUTH DALY 15 tablet 3  . Blood Glucose Monitoring Suppl (ONE TOUCH ULTRA 2) W/DEVICE KIT Check blood sugar daily before breakfast 1 each 0  . budesonide-formoterol (SYMBICORT) 80-4.5 MCG/ACT inhaler Inhale 2 puffs into the lungs 2 (two) times daily as needed (for shortness of breath).     [name] etonogestrel (NEXPLANON) 68 MG IMPL implant 1 each by Subdermal route once.    . fluocinonide (LIDEX) 0.05 % external solution 1 APPLICATION APPLY ON THE SKIN AS DIRECTED APPLY TO AFFECTED AREAS DAILY ON SCALP  6  . furosemide (LASIX) 20 MG tablet TAKE 1 TABLET BY MOUTH EVERY DAY FOR SHORTNESS OF BREATH  AND/OR LEG SWELLING 30 tablet 3  . glucose blood (ONE TOUCH ULTRA TEST) test strip Check blood sugar daily before breakfast 100 each 0  . Insulin Glargine (LANTUS SOLOSTAR) 100 UNIT/ML Solostar Pen Inject 35-50 units subcutaneously daily, as instructed. 15 mL 2  . Insulin Pen Needle (INSUPEN PEN NEEDLES) 32G X 4 MM MISC Inject insulin via insulin pen 1-2 x daily 50 each 5  . losartan (COZAAR) 25 MG tablet TAKE 1 TABLET BY MOUTH 2 TIMES DAILY. 60 tablet 0  . metFORMIN (GLUCOPHAGE) 1000 MG tablet TAKE 1 TABLET BY MOUTH TWICE A DAY WITH A MEAL 30 tablet 11  . mycophenolate (CELLCEPT) 500 MG tablet Take 500 mg by mouth 2 (two) times daily.    . Nutritional Supplements (GLUCERNA ADVANCE SHAKE) LIQD Take supplement daily in place of 1 meal. 30 Bottle 2  . oxyCODONE-acetaminophen (PERCOCET) 5-325 MG per tablet Take 1 tablet by mouth every 6 (six) hours as needed for severe pain. 60 tablet 0  . potassium chloride SA (KLOR-CON M20) 20 MEQ tablet TAKE 40 MEQ TODAY (2 TABLETS) THEN TAKE 20 MEQ ON DAYS THAT LASIX IS TAKEN 30 tablet 6  . predniSONE (DELTASONE) 10 MG tablet Take 4 tabs for 4 days, then take 3 for 4 days, then 2 for 4 days, then 1 for 4 days. May resume home 15m every other day after completion 40 tablet 0  . spironolactone (ALDACTONE) 25 MG tablet TAKE 1 TABLET BY MOUTH EVERY  DAY 30 tablet 1   No current facility-administered medications for this visit.     Past Medical History  Diagnosis Date  . Cardiac pacemaker     a. Since age 66 in 57. b. Upgrade to BiV in 2013.  . Congenital complete AV block   . Obesity   . GERD (gastroesophageal reflux disease)   . Asymptomatic LV dysfunction     a. Echo in Dec 2011 with EF 35 to 40%. Felt to be due to paced rhythm. b. EF 25-30% in 07/2014.  . Seizures     as a child- from high fever  . Anxiety   . Bipolar affective disorder   . Depression     bipolar  . Carpal tunnel syndrome of right wrist   . Asthma     seasonal allergies   .  Arthritis     rheumatoid arthritis- mild, no rheumatology care   . Hypertension   . Pneumonitis     a. a/w hypoxia - inflammatory - large workup 07/2014.  [name] Sinus tachycardia   . Diabetes mellitus without complication   . Presence of permanent cardiac pacemaker   . CHF (congestive heart failure)     ROS:   All systems reviewed and negative except as noted in the HPI.   Past Surgical History  Procedure Laterality Date  . Throat surgery  1994    s/p laser treatment  . Cesarean section    . Insert / replace / remove pacemaker      2001  . Cholecystectomy    . Iud removal  11/03/2011    Procedure: INTRAUTERINE DEVICE (IUD) REMOVAL;  Surgeon: [name], MD;  Location: [facility];  Service: Gynecology;  Laterality: N/A;  . Cyst excision  12/10/2012    THYROID  . Thyroglossal duct cyst N/A 12/10/2012    Procedure: REVISION OF THYROGLOSSAL DUCT CYST EXCISION;  Surgeon: [name], MD;  Location: [place];  Service: ENT;  Laterality: N/A;  Revision of Thyroglossal Duct Cyst Excision  . Nasal fracture surgery      /w plate   . Carpal tunnel with cubital tunnel Right 07/26/2013    Procedure: RIGHT LIMITED OPEN CARPAL TUNNEL RELEASE ,  RIGHT CUBITAL TUNNEL RELEASE, INSITU VERSES ULNAR NERVE DECOMPRESSION AND ANTERIOR TRANSPOSITION;  Surgeon: [name], MD;  Location: [place] [facility];  Service: Orthopedics;  Laterality: Right;  . Video bronchoscopy Bilateral 06/19/2014    Procedure: VIDEO BRONCHOSCOPY WITHOUT FLUORO;  Surgeon: Brand Males, MD;  Location: [place];  Service: Cardiopulmonary;  Laterality: Bilateral;  . Bi-ventricular pacemaker upgrade N/A 03/08/2012    Procedure: BI-VENTRICULAR PACEMAKER UPGRADE;  Surgeon: [name], MD;  Location: [facility] CATH LAB;  Service: Cardiovascular;  Laterality: N/A;  . Atrial tach ablation N/A 08/14/2014    Procedure: ATRIAL TACH ABLATION;  Surgeon: [name], MD;  Location: [facility] CATH LAB;  Service: Cardiovascular;  Laterality: N/A;  . Right heart  catheterization N/A 10/26/2014    Procedure: RIGHT HEART CATH;  Surgeon: [name], MD;  Location: [facility] CATH LAB;  Service: Cardiovascular;  Laterality: N/A;     Family History  Problem Relation Age of Onset  . Heart disease Mother     CHF (no details)  . Hypertension Mother   . Heart disease Father     Murmur  . Heart disease Sister 21     No details.  History of a pacemaker  . Lupus Mother      Social History   Social  History  . Marital Status: Single    Spouse Name: N/A  . Number of Children: 1  . Years of Education: N/A   Occupational History  . CNA    Social History Main Topics  . Smoking status: Former Smoker -- 0.25 packs/day for .5 years    Types: Cigarettes    Quit date: 07/25/1996  . Smokeless tobacco: Never Used  . Alcohol Use: No  . Drug Use: No  . Sexual Activity: Yes    Birth Control/ Protection: Implant, IUD     Comment: placed 04/2011   Other Topics Concern  . Not on file   Social History Narrative   Works at [employer].       BP 98/74 mmHg  Pulse 111  Ht _0  (1.651 m)  Wt 187 lb 6.4 oz (85.004 kg)  BMI 31.18 kg/m2  Physical Exam:  Chronically ill appearing 34 year old woman, wearing oxygen by nasal cannula. NAD HEENT: Unremarkable Neck:  6 cm JVD, no thyromegally Lymphatics:  No adenopathy Back:  No CVA tenderness Lungs:  Clear with scattered basilar rales, and scattered expiratory wheezes. HEART:  Regular tachy rhythm, no murmurs, no rubs, no clicks Abd:  soft, positive bowel sounds, no organomegally, no rebound, no guarding Ext:  2 plus pulses, no edema, no cyanosis, no clubbing Skin:  No rashes no nodules Neuro:  CN II through XII intact, motor grossly intact  DEVICE  Normal device function.  See PaceArt for details.   Assess/Plan:

## 2015-06-27 NOTE — Assessment & Plan Note (Signed)
She will continue her steroids, bronchodilators and oxygen therapy.

## 2015-06-27 NOTE — Assessment & Plan Note (Signed)
Her blood pressure is well controlled. She will continue her current meds.  

## 2015-06-27 NOTE — Patient Instructions (Signed)
Medication Instructions:  Your physician recommends that you continue on your current medications as directed. Please refer to the Current Medication list given to you today.  Labwork: None ordered  Testing/Procedures: None ordered  Follow-Up: Remote monitoring is used to monitor your Pacemaker of ICD from home. This monitoring reduces the number of office visits required to check your device to one time per year. It allows us to keep an eye on the functioning of your device to ensure it is working properly. You are scheduled for a device check from home on 09/26/15. You may send your transmission at any time that day. If you have a wireless device, the transmission will be sent automatically. After your physician reviews your transmission, you will receive a postcard with your next transmission date.  Your physician wants you to follow-up in: 1 year with Dr. Taylor. You will receive a reminder letter in the mail two months in advance. If you don't receive a letter, please call our office to schedule the follow-up appointment.   Any Other Special Instructions Will Be Listed Below (If Applicable). Thank you for choosing [facility]!!       

## 2015-06-27 NOTE — Assessment & Plan Note (Signed)
Her medtronic Biv PM is working normally. We will recheck in several months.

## 2015-06-28 NOTE — Addendum Note (Signed)
Addended by: Reesa Chew on: 06/28/2015 05:35 PM   Modules accepted: Orders

## 2015-06-29 ENCOUNTER — Other Ambulatory Visit: Payer: Self-pay | Admitting: Cardiology

## 2015-07-07 ENCOUNTER — Encounter (HOSPITAL_COMMUNITY): Payer: Self-pay | Admitting: Emergency Medicine

## 2015-07-07 ENCOUNTER — Emergency Department (HOSPITAL_COMMUNITY): Payer: Medicare Other

## 2015-07-07 DIAGNOSIS — I10 Essential (primary) hypertension: Secondary | ICD-10-CM | POA: Diagnosis present

## 2015-07-07 DIAGNOSIS — M329 Systemic lupus erythematosus, unspecified: Secondary | ICD-10-CM | POA: Diagnosis present

## 2015-07-07 DIAGNOSIS — F319 Bipolar disorder, unspecified: Secondary | ICD-10-CM | POA: Diagnosis present

## 2015-07-07 DIAGNOSIS — Z794 Long term (current) use of insulin: Secondary | ICD-10-CM

## 2015-07-07 DIAGNOSIS — Z888 Allergy status to other drugs, medicaments and biological substances status: Secondary | ICD-10-CM

## 2015-07-07 DIAGNOSIS — E785 Hyperlipidemia, unspecified: Secondary | ICD-10-CM | POA: Diagnosis present

## 2015-07-07 DIAGNOSIS — R05 Cough: Secondary | ICD-10-CM | POA: Diagnosis not present

## 2015-07-07 DIAGNOSIS — F419 Anxiety disorder, unspecified: Secondary | ICD-10-CM | POA: Diagnosis present

## 2015-07-07 DIAGNOSIS — Z87891 Personal history of nicotine dependence: Secondary | ICD-10-CM | POA: Diagnosis not present

## 2015-07-07 DIAGNOSIS — J329 Chronic sinusitis, unspecified: Secondary | ICD-10-CM | POA: Diagnosis present

## 2015-07-07 DIAGNOSIS — Z7952 Long term (current) use of systemic steroids: Secondary | ICD-10-CM

## 2015-07-07 DIAGNOSIS — E119 Type 2 diabetes mellitus without complications: Secondary | ICD-10-CM | POA: Diagnosis present

## 2015-07-07 DIAGNOSIS — Z950 Presence of cardiac pacemaker: Secondary | ICD-10-CM

## 2015-07-07 DIAGNOSIS — I959 Hypotension, unspecified: Principal | ICD-10-CM | POA: Diagnosis present

## 2015-07-07 DIAGNOSIS — Z9981 Dependence on supplemental oxygen: Secondary | ICD-10-CM

## 2015-07-07 DIAGNOSIS — Z91048 Other nonmedicinal substance allergy status: Secondary | ICD-10-CM

## 2015-07-07 DIAGNOSIS — Q246 Congenital heart block: Secondary | ICD-10-CM | POA: Diagnosis not present

## 2015-07-07 DIAGNOSIS — M069 Rheumatoid arthritis, unspecified: Secondary | ICD-10-CM | POA: Diagnosis present

## 2015-07-07 DIAGNOSIS — R079 Chest pain, unspecified: Secondary | ICD-10-CM | POA: Diagnosis not present

## 2015-07-07 DIAGNOSIS — I420 Dilated cardiomyopathy: Secondary | ICD-10-CM | POA: Diagnosis present

## 2015-07-07 DIAGNOSIS — I5042 Chronic combined systolic (congestive) and diastolic (congestive) heart failure: Secondary | ICD-10-CM | POA: Diagnosis present

## 2015-07-07 DIAGNOSIS — Z9049 Acquired absence of other specified parts of digestive tract: Secondary | ICD-10-CM | POA: Diagnosis present

## 2015-07-07 DIAGNOSIS — Z79899 Other long term (current) drug therapy: Secondary | ICD-10-CM

## 2015-07-07 DIAGNOSIS — E876 Hypokalemia: Secondary | ICD-10-CM | POA: Diagnosis not present

## 2015-07-07 DIAGNOSIS — K219 Gastro-esophageal reflux disease without esophagitis: Secondary | ICD-10-CM | POA: Diagnosis present

## 2015-07-07 DIAGNOSIS — R0602 Shortness of breath: Secondary | ICD-10-CM | POA: Diagnosis not present

## 2015-07-07 DIAGNOSIS — J849 Interstitial pulmonary disease, unspecified: Secondary | ICD-10-CM | POA: Diagnosis not present

## 2015-07-07 DIAGNOSIS — J9620 Acute and chronic respiratory failure, unspecified whether with hypoxia or hypercapnia: Secondary | ICD-10-CM | POA: Diagnosis not present

## 2015-07-07 DIAGNOSIS — J4520 Mild intermittent asthma, uncomplicated: Secondary | ICD-10-CM | POA: Diagnosis present

## 2015-07-07 DIAGNOSIS — Z6831 Body mass index (BMI) 31.0-31.9, adult: Secondary | ICD-10-CM

## 2015-07-07 DIAGNOSIS — N390 Urinary tract infection, site not specified: Secondary | ICD-10-CM | POA: Diagnosis present

## 2015-07-07 DIAGNOSIS — E2740 Unspecified adrenocortical insufficiency: Secondary | ICD-10-CM | POA: Diagnosis present

## 2015-07-07 DIAGNOSIS — E669 Obesity, unspecified: Secondary | ICD-10-CM | POA: Diagnosis present

## 2015-07-07 DIAGNOSIS — T380X5A Adverse effect of glucocorticoids and synthetic analogues, initial encounter: Secondary | ICD-10-CM | POA: Diagnosis present

## 2015-07-07 DIAGNOSIS — K580 Irritable bowel syndrome with diarrhea: Secondary | ICD-10-CM | POA: Diagnosis present

## 2015-07-07 NOTE — ED Notes (Signed)
Pt reports sob with productive cough for several days. Pt with significant cardiac and resp hx. Pt on 4L home o2.

## 2015-07-08 ENCOUNTER — Emergency Department (HOSPITAL_COMMUNITY): Payer: Medicare Other

## 2015-07-08 ENCOUNTER — Inpatient Hospital Stay (HOSPITAL_COMMUNITY)
Admission: EM | Admit: 2015-07-08 | Discharge: 2015-07-15 | DRG: 314 | Disposition: A | Discharge status: Home / Self Care | Payer: Medicare Other | Attending: Family Medicine | Admitting: Family Medicine

## 2015-07-08 ENCOUNTER — Encounter (HOSPITAL_COMMUNITY): Payer: Self-pay | Admitting: Radiology

## 2015-07-08 DIAGNOSIS — I959 Hypotension, unspecified: Secondary | ICD-10-CM | POA: Insufficient documentation

## 2015-07-08 DIAGNOSIS — R0602 Shortness of breath: Secondary | ICD-10-CM | POA: Insufficient documentation

## 2015-07-08 DIAGNOSIS — T380X5A Adverse effect of glucocorticoids and synthetic analogues, initial encounter: Secondary | ICD-10-CM | POA: Diagnosis present

## 2015-07-08 DIAGNOSIS — E1169 Type 2 diabetes mellitus with other specified complication: Secondary | ICD-10-CM

## 2015-07-08 DIAGNOSIS — Q246 Congenital heart block: Secondary | ICD-10-CM | POA: Insufficient documentation

## 2015-07-08 DIAGNOSIS — I950 Idiopathic hypotension: Secondary | ICD-10-CM | POA: Diagnosis not present

## 2015-07-08 DIAGNOSIS — K219 Gastro-esophageal reflux disease without esophagitis: Secondary | ICD-10-CM | POA: Diagnosis present

## 2015-07-08 DIAGNOSIS — I420 Dilated cardiomyopathy: Secondary | ICD-10-CM | POA: Diagnosis present

## 2015-07-08 DIAGNOSIS — Z9981 Dependence on supplemental oxygen: Secondary | ICD-10-CM | POA: Diagnosis not present

## 2015-07-08 DIAGNOSIS — I509 Heart failure, unspecified: Secondary | ICD-10-CM | POA: Diagnosis not present

## 2015-07-08 DIAGNOSIS — F419 Anxiety disorder, unspecified: Secondary | ICD-10-CM | POA: Diagnosis present

## 2015-07-08 DIAGNOSIS — J329 Chronic sinusitis, unspecified: Secondary | ICD-10-CM | POA: Diagnosis present

## 2015-07-08 DIAGNOSIS — J849 Interstitial pulmonary disease, unspecified: Secondary | ICD-10-CM | POA: Diagnosis present

## 2015-07-08 DIAGNOSIS — F319 Bipolar disorder, unspecified: Secondary | ICD-10-CM | POA: Diagnosis present

## 2015-07-08 DIAGNOSIS — D8989 Other specified disorders involving the immune mechanism, not elsewhere classified: Secondary | ICD-10-CM | POA: Insufficient documentation

## 2015-07-08 DIAGNOSIS — E785 Hyperlipidemia, unspecified: Secondary | ICD-10-CM | POA: Diagnosis present

## 2015-07-08 DIAGNOSIS — I5032 Chronic diastolic (congestive) heart failure: Secondary | ICD-10-CM | POA: Diagnosis not present

## 2015-07-08 DIAGNOSIS — I5033 Acute on chronic diastolic (congestive) heart failure: Secondary | ICD-10-CM | POA: Insufficient documentation

## 2015-07-08 DIAGNOSIS — M329 Systemic lupus erythematosus, unspecified: Secondary | ICD-10-CM | POA: Diagnosis not present

## 2015-07-08 DIAGNOSIS — E2740 Unspecified adrenocortical insufficiency: Secondary | ICD-10-CM | POA: Insufficient documentation

## 2015-07-08 DIAGNOSIS — J4520 Mild intermittent asthma, uncomplicated: Secondary | ICD-10-CM | POA: Diagnosis present

## 2015-07-08 DIAGNOSIS — K580 Irritable bowel syndrome with diarrhea: Secondary | ICD-10-CM | POA: Diagnosis present

## 2015-07-08 DIAGNOSIS — I5042 Chronic combined systolic (congestive) and diastolic (congestive) heart failure: Secondary | ICD-10-CM | POA: Diagnosis present

## 2015-07-08 DIAGNOSIS — I10 Essential (primary) hypertension: Secondary | ICD-10-CM | POA: Diagnosis present

## 2015-07-08 DIAGNOSIS — R079 Chest pain, unspecified: Secondary | ICD-10-CM | POA: Diagnosis not present

## 2015-07-08 DIAGNOSIS — Z79899 Other long term (current) drug therapy: Secondary | ICD-10-CM | POA: Diagnosis not present

## 2015-07-08 DIAGNOSIS — E119 Type 2 diabetes mellitus without complications: Secondary | ICD-10-CM | POA: Diagnosis present

## 2015-07-08 DIAGNOSIS — E669 Obesity, unspecified: Secondary | ICD-10-CM | POA: Diagnosis present

## 2015-07-08 DIAGNOSIS — J9620 Acute and chronic respiratory failure, unspecified whether with hypoxia or hypercapnia: Secondary | ICD-10-CM | POA: Diagnosis not present

## 2015-07-08 DIAGNOSIS — E876 Hypokalemia: Secondary | ICD-10-CM | POA: Diagnosis not present

## 2015-07-08 DIAGNOSIS — Z7952 Long term (current) use of systemic steroids: Secondary | ICD-10-CM | POA: Diagnosis not present

## 2015-07-08 DIAGNOSIS — Z950 Presence of cardiac pacemaker: Secondary | ICD-10-CM | POA: Diagnosis not present

## 2015-07-08 DIAGNOSIS — I9589 Other hypotension: Secondary | ICD-10-CM | POA: Diagnosis not present

## 2015-07-08 DIAGNOSIS — M069 Rheumatoid arthritis, unspecified: Secondary | ICD-10-CM | POA: Diagnosis present

## 2015-07-08 DIAGNOSIS — I5022 Chronic systolic (congestive) heart failure: Secondary | ICD-10-CM | POA: Diagnosis not present

## 2015-07-08 DIAGNOSIS — Z87891 Personal history of nicotine dependence: Secondary | ICD-10-CM | POA: Diagnosis not present

## 2015-07-08 DIAGNOSIS — Z91048 Other nonmedicinal substance allergy status: Secondary | ICD-10-CM | POA: Diagnosis not present

## 2015-07-08 DIAGNOSIS — Z888 Allergy status to other drugs, medicaments and biological substances status: Secondary | ICD-10-CM | POA: Diagnosis not present

## 2015-07-08 DIAGNOSIS — Z794 Long term (current) use of insulin: Secondary | ICD-10-CM | POA: Diagnosis not present

## 2015-07-08 DIAGNOSIS — N390 Urinary tract infection, site not specified: Secondary | ICD-10-CM | POA: Diagnosis present

## 2015-07-08 DIAGNOSIS — Z9049 Acquired absence of other specified parts of digestive tract: Secondary | ICD-10-CM | POA: Diagnosis present

## 2015-07-08 DIAGNOSIS — Z6831 Body mass index (BMI) 31.0-31.9, adult: Secondary | ICD-10-CM | POA: Diagnosis not present

## 2015-07-08 HISTORY — DX: Reserved for concepts with insufficient information to code with codable children: IMO0002

## 2015-07-08 HISTORY — DX: Systemic lupus erythematosus, unspecified: M32.9

## 2015-07-08 LAB — CBC
HEMATOCRIT: 39.3 % (ref 36.0–46.0)
HEMOGLOBIN: 12.7 g/dL (ref 12.0–15.0)
MCH: 26.9 pg (ref 26.0–34.0)
MCHC: 32.3 g/dL (ref 30.0–36.0)
MCV: 83.3 fL (ref 78.0–100.0)
Platelets: 418 10*3/uL — ABNORMAL HIGH (ref 150–400)
RBC: 4.72 MIL/uL (ref 3.87–5.11)
RDW: 16.6 % — ABNORMAL HIGH (ref 11.5–15.5)
WBC: 10.1 10*3/uL (ref 4.0–10.5)

## 2015-07-08 LAB — URINALYSIS, ROUTINE W REFLEX MICROSCOPIC
Bilirubin Urine: NEGATIVE
GLUCOSE, UA: NEGATIVE mg/dL
KETONES UR: 15 mg/dL — AB
Nitrite: POSITIVE — AB
PH: 6 (ref 5.0–8.0)
Protein, ur: NEGATIVE mg/dL
SPECIFIC GRAVITY, URINE: 1.018 (ref 1.005–1.030)
Urobilinogen, UA: 1 mg/dL (ref 0.0–1.0)

## 2015-07-08 LAB — GLUCOSE, CAPILLARY
GLUCOSE-CAPILLARY: 145 mg/dL — AB (ref 65–99)
Glucose-Capillary: 161 mg/dL — ABNORMAL HIGH (ref 65–99)

## 2015-07-08 LAB — COMPREHENSIVE METABOLIC PANEL
ALK PHOS: 48 U/L (ref 38–126)
ALT: 65 U/L — AB (ref 14–54)
AST: 97 U/L — ABNORMAL HIGH (ref 15–41)
Albumin: 2 g/dL — ABNORMAL LOW (ref 3.5–5.0)
Anion gap: 6 (ref 5–15)
BUN: 9 mg/dL (ref 6–20)
CALCIUM: 7.8 mg/dL — AB (ref 8.9–10.3)
CO2: 23 mmol/L (ref 22–32)
CREATININE: 0.69 mg/dL (ref 0.44–1.00)
Chloride: 107 mmol/L (ref 101–111)
Glucose, Bld: 147 mg/dL — ABNORMAL HIGH (ref 65–99)
Potassium: 4.3 mmol/L (ref 3.5–5.1)
SODIUM: 136 mmol/L (ref 135–145)
Total Bilirubin: 0.8 mg/dL (ref 0.3–1.2)
Total Protein: 5.9 g/dL — ABNORMAL LOW (ref 6.5–8.1)

## 2015-07-08 LAB — BASIC METABOLIC PANEL
ANION GAP: 11 (ref 5–15)
BUN: 12 mg/dL (ref 6–20)
CO2: 21 mmol/L — ABNORMAL LOW (ref 22–32)
Calcium: 8.5 mg/dL — ABNORMAL LOW (ref 8.9–10.3)
Chloride: 102 mmol/L (ref 101–111)
Creatinine, Ser: 0.71 mg/dL (ref 0.44–1.00)
GFR calc Af Amer: >60 mL/min (ref >60)
GLUCOSE: 117 mg/dL — AB (ref 65–99)
POTASSIUM: 4.9 mmol/L (ref 3.5–5.1)
Sodium: 134 mmol/L — ABNORMAL LOW (ref 135–145)

## 2015-07-08 LAB — I-STAT BETA HCG BLOOD, ED (MC, WL, AP ONLY): I-stat hCG, quantitative: <5 m[IU]/mL (ref <5)

## 2015-07-08 LAB — I-STAT CG4 LACTIC ACID, ED: LACTIC ACID, VENOUS: 1.82 mmol/L (ref 0.5–2.0)

## 2015-07-08 LAB — URINE MICROSCOPIC-ADD ON

## 2015-07-08 LAB — I-STAT TROPONIN, ED: TROPONIN I, POC: 0.06 ng/mL (ref 0.00–0.08)

## 2015-07-08 LAB — CORTISOL: Cortisol, Plasma: 80.5 ug/dL

## 2015-07-08 LAB — BRAIN NATRIURETIC PEPTIDE: B Natriuretic Peptide: 34.4 pg/mL (ref 0.0–100.0)

## 2015-07-08 LAB — D-DIMER, QUANTITATIVE (NOT AT ARMC): D DIMER QUANT: 2.68 ug{FEU}/mL — AB (ref 0.00–0.48)

## 2015-07-08 LAB — MRSA PCR SCREENING: MRSA BY PCR: NEGATIVE

## 2015-07-08 MED ORDER — INSULIN GLARGINE 100 UNIT/ML ~~LOC~~ SOLN
20.0000 [IU] | Freq: Every day | SUBCUTANEOUS | Status: DC
  Administered 2015-07-08 – 2015-07-14 (×7): 20 [IU] via SUBCUTANEOUS
  Filled 2015-07-08 (×8): qty 0.2

## 2015-07-08 MED ORDER — INSULIN ASPART 100 UNIT/ML ~~LOC~~ SOLN
0.0000 [IU] | Freq: Three times a day (TID) | SUBCUTANEOUS | Status: DC
  Administered 2015-07-08: 3 [IU] via SUBCUTANEOUS
  Administered 2015-07-09: 5 [IU] via SUBCUTANEOUS
  Administered 2015-07-09: 2 [IU] via SUBCUTANEOUS
  Administered 2015-07-09 – 2015-07-10 (×2): 3 [IU] via SUBCUTANEOUS
  Administered 2015-07-10: 2 [IU] via SUBCUTANEOUS
  Administered 2015-07-10: 3 [IU] via SUBCUTANEOUS
  Administered 2015-07-11: 2 [IU] via SUBCUTANEOUS
  Administered 2015-07-11: 8 [IU] via SUBCUTANEOUS
  Administered 2015-07-11: 3 [IU] via SUBCUTANEOUS
  Administered 2015-07-12: 5 [IU] via SUBCUTANEOUS
  Administered 2015-07-12 – 2015-07-13 (×2): 8 [IU] via SUBCUTANEOUS
  Administered 2015-07-13: 5 [IU] via SUBCUTANEOUS
  Administered 2015-07-14: 2 [IU] via SUBCUTANEOUS
  Administered 2015-07-14: 3 [IU] via SUBCUTANEOUS
  Administered 2015-07-14: 8 [IU] via SUBCUTANEOUS
  Administered 2015-07-15: 3 [IU] via SUBCUTANEOUS

## 2015-07-08 MED ORDER — HYDROCORTISONE NA SUCCINATE PF 100 MG IJ SOLR
100.0000 mg | Freq: Once | INTRAMUSCULAR | Status: AC
  Administered 2015-07-08: 100 mg via INTRAVENOUS
  Filled 2015-07-08: qty 2

## 2015-07-08 MED ORDER — ONDANSETRON HCL 4 MG PO TABS: 4.0000 mg | ORAL_TABLET | Freq: Four times a day (QID) | ORAL | Status: DC | PRN

## 2015-07-08 MED ORDER — PIPERACILLIN-TAZOBACTAM 3.375 G IVPB 30 MIN
3.3750 g | Freq: Once | INTRAVENOUS | Status: AC
  Administered 2015-07-08: 3.375 g via INTRAVENOUS
  Filled 2015-07-08: qty 50

## 2015-07-08 MED ORDER — SODIUM CHLORIDE 0.9 % IV SOLN: 250.0000 mL | INTRAVENOUS | Status: DC | PRN

## 2015-07-08 MED ORDER — SODIUM CHLORIDE 0.9 % IV BOLUS (SEPSIS)
1000.0000 mL | Freq: Once | INTRAVENOUS | Status: AC
  Administered 2015-07-08: 1000 mL via INTRAVENOUS

## 2015-07-08 MED ORDER — ATORVASTATIN CALCIUM 40 MG PO TABS
40.0000 mg | ORAL_TABLET | Freq: Every day | ORAL | Status: DC
  Administered 2015-07-08 – 2015-07-14 (×7): 40 mg via ORAL
  Filled 2015-07-08 (×7): qty 1

## 2015-07-08 MED ORDER — VANCOMYCIN HCL IN DEXTROSE 1-5 GM/200ML-% IV SOLN
1000.0000 mg | Freq: Once | INTRAVENOUS | Status: AC
  Administered 2015-07-08: 1000 mg via INTRAVENOUS
  Filled 2015-07-08: qty 200

## 2015-07-08 MED ORDER — MYCOPHENOLATE MOFETIL 250 MG PO CAPS
500.0000 mg | ORAL_CAPSULE | Freq: Two times a day (BID) | ORAL | Status: DC
  Administered 2015-07-08 – 2015-07-15 (×14): 500 mg via ORAL
  Filled 2015-07-08 (×15): qty 2

## 2015-07-08 MED ORDER — BUDESONIDE-FORMOTEROL FUMARATE 80-4.5 MCG/ACT IN AERO
2.0000 | INHALATION_SPRAY | Freq: Two times a day (BID) | RESPIRATORY_TRACT | Status: DC | PRN
  Filled 2015-07-08: qty 6.9

## 2015-07-08 MED ORDER — SODIUM CHLORIDE 0.9 % IJ SOLN
3.0000 mL | Freq: Two times a day (BID) | INTRAMUSCULAR | Status: DC
  Administered 2015-07-08 – 2015-07-09 (×3): 3 mL via INTRAVENOUS
  Administered 2015-07-10: 10:00:00 via INTRAVENOUS
  Administered 2015-07-10: 5 mL via INTRAVENOUS
  Administered 2015-07-10: 10:00:00 via INTRAVENOUS
  Administered 2015-07-10 – 2015-07-11 (×2): 3 mL via INTRAVENOUS
  Administered 2015-07-11 (×2): via INTRAVENOUS
  Administered 2015-07-12: 3 mL via INTRAVENOUS
  Administered 2015-07-12: 10 mL via INTRAVENOUS
  Administered 2015-07-13 – 2015-07-15 (×5): 3 mL via INTRAVENOUS

## 2015-07-08 MED ORDER — LEVOFLOXACIN IN D5W 750 MG/150ML IV SOLN
750.0000 mg | Freq: Once | INTRAVENOUS | Status: AC
  Administered 2015-07-08: 750 mg via INTRAVENOUS
  Filled 2015-07-08: qty 150

## 2015-07-08 MED ORDER — ALBUTEROL SULFATE (2.5 MG/3ML) 0.083% IN NEBU: 2.5000 mg | INHALATION_SOLUTION | RESPIRATORY_TRACT | Status: DC | PRN

## 2015-07-08 MED ORDER — CALCIUM CARBONATE-VITAMIN D 500-200 MG-UNIT PO TABS
1.0000 | ORAL_TABLET | Freq: Every day | ORAL | Status: DC
  Administered 2015-07-09 – 2015-07-15 (×7): 1 via ORAL
  Filled 2015-07-08 (×6): qty 1
  Filled 2015-07-08: qty 2

## 2015-07-08 MED ORDER — DEXAMETHASONE SODIUM PHOSPHATE 4 MG/ML IJ SOLN
4.0000 mg | Freq: Once | INTRAMUSCULAR | Status: AC
  Administered 2015-07-08: 4 mg via INTRAVENOUS
  Filled 2015-07-08: qty 1

## 2015-07-08 MED ORDER — SODIUM CHLORIDE 0.9 % IV SOLN
INTRAVENOUS | Status: DC
  Filled 2015-07-08: qty 1000

## 2015-07-08 MED ORDER — IOHEXOL 350 MG/ML SOLN
80.0000 mL | Freq: Once | INTRAVENOUS | Status: AC | PRN
  Administered 2015-07-08: 80 mL via INTRAVENOUS

## 2015-07-08 MED ORDER — INSULIN ASPART 100 UNIT/ML ~~LOC~~ SOLN
0.0000 [IU] | Freq: Every day | SUBCUTANEOUS | Status: DC
  Administered 2015-07-11: 2 [IU] via SUBCUTANEOUS
  Administered 2015-07-14: 3 [IU] via SUBCUTANEOUS

## 2015-07-08 MED ORDER — SODIUM CHLORIDE 0.9 % IJ SOLN: 3.0000 mL | INTRAMUSCULAR | Status: DC | PRN

## 2015-07-08 MED ORDER — OXYCODONE-ACETAMINOPHEN 5-325 MG PO TABS
1.0000 | ORAL_TABLET | Freq: Four times a day (QID) | ORAL | Status: DC | PRN
  Administered 2015-07-10: 1 via ORAL
  Filled 2015-07-08: qty 1

## 2015-07-08 MED ORDER — ENOXAPARIN SODIUM 40 MG/0.4ML ~~LOC~~ SOLN
40.0000 mg | SUBCUTANEOUS | Status: DC
Start: 2015-07-08 — End: 2015-07-15
  Administered 2015-07-08 – 2015-07-14 (×7): 40 mg via SUBCUTANEOUS
  Filled 2015-07-08 (×7): qty 0.4

## 2015-07-08 MED ORDER — POTASSIUM CHLORIDE CRYS ER 20 MEQ PO TBCR
20.0000 meq | EXTENDED_RELEASE_TABLET | Freq: Every day | ORAL | Status: DC
  Administered 2015-07-10 – 2015-07-13 (×4): 20 meq via ORAL
  Filled 2015-07-08 (×4): qty 1

## 2015-07-08 MED ORDER — ONDANSETRON HCL 4 MG/2ML IJ SOLN: 4.0000 mg | Freq: Four times a day (QID) | INTRAMUSCULAR | Status: DC | PRN

## 2015-07-08 MED ORDER — DEXAMETHASONE SODIUM PHOSPHATE 4 MG/ML IJ SOLN: 4.0000 mg | Freq: Two times a day (BID) | INTRAMUSCULAR | Status: DC

## 2015-07-08 MED ORDER — FUROSEMIDE 20 MG PO TABS: 20.0000 mg | ORAL_TABLET | Freq: Every day | ORAL | Status: DC

## 2015-07-08 MED ORDER — DEXAMETHASONE SODIUM PHOSPHATE 4 MG/ML IJ SOLN
4.0000 mg | Freq: Two times a day (BID) | INTRAMUSCULAR | Status: DC
  Administered 2015-07-08 – 2015-07-09 (×2): 4 mg via INTRAVENOUS
  Filled 2015-07-08 (×2): qty 1

## 2015-07-08 NOTE — ED Provider Notes (Signed)
CSN: [id number]   Arrival date & time 07/07/15 2216  History  This chart was scribed for Julianne Rice, MD by Altamease Oiler, ED Scribe. This patient was seen in room D31C/D31C and the patient's care was started at 1:47 AM.  Chief Complaint  Patient presents with  . Shortness of Breath    HPI The history is provided by the patient. No language interpreter was used.   Makayla Vasquez is a 34 y.o. female with PMHx of interstitial lung disease, asthma, pneumonitis, CHF, HTN, DM, and lupus who presents to the Emergency Department complaining of increasing SOB with onset 2 weeks ago. The SOB is worse with exertion. She has been on 4 L of oxygen by [place] at home as needed. Associated symptoms include generalized arthralgias, chills for 1 week, rhinorrhea, diarrhea after every meal, and productive cough(pt has not paid attention to the color of her sputum).   Pt denies sore throat, nausea, vomiting, LE pain or swelling . No hospitalizations in the last 3 months. No known sick contact. Pt found out she had lupus 1 month ago and is on 5 mg of prednisone daily.   Past Medical History  Diagnosis Date  . Cardiac pacemaker     a. Since age 13 in 60. b. Upgrade to BiV in 2013.  . Congenital complete AV block   . Obesity   . GERD (gastroesophageal reflux disease)   . Asymptomatic LV dysfunction     a. Echo in Dec 2011 with EF 35 to 40%. Felt to be due to paced rhythm. b. EF 25-30% in 07/2014.  . Seizures     as a child- from high fever  . Anxiety   . Bipolar affective disorder   . Depression     bipolar  . Carpal tunnel syndrome of right wrist   . Asthma     seasonal allergies   . Arthritis     rheumatoid arthritis- mild, no rheumatology care   . Hypertension   . Pneumonitis     a. a/w hypoxia - inflammatory - large workup 07/2014.  [name] Sinus tachycardia   . Diabetes mellitus without complication   . Presence of permanent cardiac pacemaker   . CHF (congestive heart failure)     Past  Surgical History  Procedure Laterality Date  . Throat surgery  1994    s/p laser treatment  . Cesarean section    . Insert / replace / remove pacemaker      2001  . Cholecystectomy    . Iud removal  11/03/2011    Procedure: INTRAUTERINE DEVICE (IUD) REMOVAL;  Surgeon: [name], MD;  Location: [facility];  Service: Gynecology;  Laterality: N/A;  . Cyst excision  12/10/2012    THYROID  . Thyroglossal duct cyst N/A 12/10/2012    Procedure: REVISION OF THYROGLOSSAL DUCT CYST EXCISION;  Surgeon: [name], MD;  Location: [facility];  Service: ENT;  Laterality: N/A;  Revision of Thyroglossal Duct Cyst Excision  . Nasal fracture surgery      /w plate   . Carpal tunnel with cubital tunnel Right 07/26/2013    Procedure: RIGHT LIMITED OPEN CARPAL TUNNEL RELEASE ,  RIGHT CUBITAL TUNNEL RELEASE, INSITU VERSES ULNAR NERVE DECOMPRESSION AND ANTERIOR TRANSPOSITION;  Surgeon: [name], MD;  Location: [facility];  Service: Orthopedics;  Laterality: Right;  . Video bronchoscopy Bilateral 06/19/2014    Procedure: VIDEO BRONCHOSCOPY WITHOUT FLUORO;  Surgeon: Brand Males, MD;  Location: [facility];  Service: Cardiopulmonary;  Laterality:  Bilateral;  . Bi-ventricular pacemaker upgrade N/A 03/08/2012    Procedure: BI-VENTRICULAR PACEMAKER UPGRADE;  Surgeon: [name], MD;  Location: [facility] CATH LAB;  Service: Cardiovascular;  Laterality: N/A;  . Atrial tach ablation N/A 08/14/2014    Procedure: ATRIAL TACH ABLATION;  Surgeon: [name], MD;  Location: [facility] CATH LAB;  Service: Cardiovascular;  Laterality: N/A;  . Right heart catheterization N/A 10/26/2014    Procedure: RIGHT HEART CATH;  Surgeon: [name], MD;  Location: [facility] CATH LAB;  Service: Cardiovascular;  Laterality: N/A;    Family History  Problem Relation Age of Onset  . Heart disease Mother     CHF (no details)  . Hypertension Mother   . Heart disease Father     Murmur  . Heart disease Sister 62     No details.  History of a pacemaker   . Lupus Mother     Social History  Substance Use Topics  . Smoking status: Former Smoker -- 0.25 packs/day for .5 years    Types: Cigarettes    Quit date: 07/25/1996  . Smokeless tobacco: Never Used  . Alcohol Use: No     Review of Systems  Constitutional: Positive for chills and fatigue. Negative for fever.  HENT: Positive for congestion and rhinorrhea. Negative for sinus pressure and sore throat.   Respiratory: Positive for cough and shortness of breath.   Cardiovascular: Negative for chest pain.  Gastrointestinal: Positive for diarrhea. Negative for nausea, vomiting, abdominal pain and constipation.  Genitourinary: Negative for dysuria, frequency, flank pain, difficulty urinating and dyspareunia.  Musculoskeletal: Positive for myalgias and arthralgias. Negative for back pain, neck pain and neck stiffness.  Skin: Negative for rash and wound.  Neurological: Negative for dizziness, weakness, light-headedness, numbness and headaches.  All other systems reviewed and are negative.  Home Medications   Prior to Admission medications   Medication Sig Start Date End Date Taking? Authorizing Provider  acetaminophen (TYLENOL) 500 MG tablet Take 1 tablet (500 mg total) by mouth 2 (two) times daily. 04/18/15  Yes Elberta Leatherwood, MD  albuterol (PROVENTIL HFA;VENTOLIN HFA) 108 (90 BASE) MCG/ACT inhaler Inhale 2 puffs into the lungs every 4 (four) hours as needed for wheezing or shortness of breath. 06/05/15  Yes Alexander J Karamalegos, DO  atorvastatin (LIPITOR) 40 MG tablet Take 1 tablet (40 mg total) by mouth daily. 05/04/15  Yes Alexander Devin Going, DO  bisoprolol (ZEBETA) 5 MG tablet TAKE 1/2 TABLET BY MOUTH DALY 03/29/15  Yes [name], MD  Blood Glucose Monitoring Suppl (ONE TOUCH ULTRA 2) W/DEVICE KIT Check blood sugar daily before breakfast 10/25/14  Yes Leone Brand, MD  budesonide-formoterol [facility]) 80-4.5 MCG/ACT inhaler Inhale 2 puffs into the lungs 2 (two) times daily as  needed (for shortness of breath).  04/06/14  Yes Deneise Lever, MD  calcium-vitamin D (OSCAL WITH D) 500-200 MG-UNIT per tablet Take 1 tablet by mouth.   Yes Historical Provider, MD  etonogestrel (NEXPLANON) 68 MG IMPL implant 1 each by Subdermal route once.   Yes Historical Provider, MD  furosemide (LASIX) 20 MG tablet TAKE 1 TABLET BY MOUTH EVERY DAY FOR SHORTNESS OF BREATH AND/OR LEG SWELLING 05/18/15  Yes [name], DO  glucose blood (ONE TOUCH ULTRA TEST) test strip Check blood sugar daily before breakfast 10/25/14  Yes Leone Brand, MD  Insulin Glargine (LANTUS SOLOSTAR) 100 UNIT/ML Solostar Pen Inject 35-50 units subcutaneously daily, as instructed. 02/16/15  Yes [name], DO  Insulin Pen Needle (INSUPEN PEN NEEDLES) 32G X 4 MM MISC Inject insulin via insulin pen 1-2 x daily 11/03/14  Yes Alexander J Karamalegos, DO  losartan (COZAAR) 25 MG tablet TAKE 1 TABLET BY MOUTH 2 TIMES DAILY. 05/11/15  Yes [name], MD  metFORMIN (GLUCOPHAGE) 1000 MG tablet TAKE 1 TABLET BY MOUTH TWICE A DAY WITH A MEAL 05/18/15  Yes [name], DO  mycophenolate (CELLCEPT) 500 MG tablet Take 500 mg by mouth 2 (two) times daily.   Yes Historical Provider, MD  oxyCODONE-acetaminophen (PERCOCET) 5-325 MG per tablet Take 1 tablet by mouth every 6 (six) hours as needed for severe pain. 06/05/15  Yes Alexander Devin Going, DO  potassium chloride SA (KLOR-CON M20) 20 MEQ tablet TAKE 40 MEQ TODAY (2 TABLETS) THEN TAKE 20 MEQ ON DAYS THAT LASIX IS TAKEN 02/12/15  Yes [name], MD  predniSONE (DELTASONE) 5 MG tablet Take 5 mg by mouth daily with breakfast.   Yes Historical Provider, MD  spironolactone (ALDACTONE) 25 MG tablet TAKE 1 TABLET BY MOUTH EVERY DAY 04/24/15  Yes [name], MD  Nutritional Supplements (GLUCERNA ADVANCE SHAKE) LIQD Take supplement daily in place of 1 meal. Patient not taking: Reported on 07/08/2015 11/03/14   [name], DO     Allergies  Sertraline hcl and Tape  Triage Vitals: BP 89/67 mmHg  Pulse 130  Temp(Src) 100.2 F (37.9 C) (Oral)  Resp 22  Ht 5' 5"  (1.651 m)  Wt 180 lb 8 oz (81.874 kg)  BMI 30.04 kg/m2  SpO2 92%  LMP 07/04/2015  Physical Exam  Constitutional: She is oriented to person, place, and time. She appears well-developed and well-nourished. No distress.  HENT:  Head: Normocephalic and atraumatic.  Mouth/Throat: Oropharynx is clear and moist. No oropharyngeal exudate.  No sinus tenderness with percussion.  Eyes: EOM are normal. Pupils are equal, round, and reactive to light.  Neck: Normal range of motion. Neck supple.  No meningismus  Cardiovascular: Regular rhythm.   Tachycardia  Pulmonary/Chest: Effort normal and breath sounds normal. No respiratory distress. She has no wheezes. She has no rales.  Decreased breath sounds bilateral bases  Abdominal: Soft. Bowel sounds are normal. She exhibits no distension and no mass. There is no tenderness. There is no rebound and no guarding.  Musculoskeletal: Normal range of motion. She exhibits no edema or tenderness.  No lower extremity swelling or pain.  Neurological: She is alert and oriented to person, place, and time.  Moves all extremities without deficit. Sensation is fully intact.  Skin: Skin is warm and dry. No rash noted. No erythema.  Psychiatric: She has a normal mood and affect. Her behavior is normal.  Nursing note and vitals reviewed.   ED Course  Procedures   DIAGNOSTIC STUDIES: Oxygen Saturation is 92% on 4 L [place], adequate by my interpretation.    COORDINATION OF CARE: 1:54 AM Discussed treatment plan which includes lab work, CXR, EKG, and IVF with pt at bedside and pt agreed to plan.  Labs Reviewed  BASIC METABOLIC PANEL - Abnormal; Notable for the following:    Sodium 134 (*)    CO2 21 (*)    Glucose, Bld 117 (*)    Calcium 8.5 (*)    All other components within normal limits  CBC - Abnormal; Notable for the  following:    RDW 16.6 (*)    Platelets 418 (*)    All other components within normal limits  URINALYSIS, ROUTINE W  REFLEX MICROSCOPIC (NOT AT [facility]) - Abnormal; Notable for the following:    APPearance CLOUDY (*)    Hgb urine dipstick LARGE (*)    Ketones, ur 15 (*)    Nitrite POSITIVE (*)    Leukocytes, UA TRACE (*)    All other components within normal limits  D-DIMER, QUANTITATIVE (NOT AT [facility]) - Abnormal; Notable for the following:    D-Dimer, Quant 2.68 (*)    All other components within normal limits  URINE MICROSCOPIC-ADD ON - Abnormal; Notable for the following:    Squamous Epithelial / LPF FEW (*)    Bacteria, UA FEW (*)    All other components within normal limits  CULTURE, BLOOD (ROUTINE X 2)  CULTURE, BLOOD (ROUTINE X 2)  BRAIN NATRIURETIC PEPTIDE  POC URINE PREG, ED  I-STAT CG4 LACTIC ACID, ED  I-STAT BETA HCG BLOOD, ED ([facility], [facility], [facility] ONLY)  I-STAT TROPOININ, ED    Imaging Review Dg Chest 2 View  07/08/2015   CLINICAL DATA:  Productive cough and shortness of breath for several days.  EXAM: CHEST  2 VIEW  COMPARISON:  04/25/2015, multiple priors  FINDINGS: Multi lead left-sided pacemaker remains in place. Mild cardiomegaly, increased from prior. Chronically increased interstitial markings are again seen, question progressed from prior exam. No confluent airspace disease, pleural effusion, or pneumothorax.  IMPRESSION: Chronic increased interstitial markings, however slightly progressed from prior exam. Increased mild cardiomegaly. CHF superimposed on chronic lung disease is considered.   Electronically Signed   By: Jeb Levering M.D.   On: 07/08/2015 00:12   Ct Angio Chest Pe W/cm &/or Wo Cm  07/08/2015   CLINICAL DATA:  Shortness of breath, Chest pain, and Cough for the last 2 weeks. D-dimer is 2.68. History of hypertension and pacemaker  EXAM: CT ANGIOGRAPHY CHEST WITH CONTRAST  TECHNIQUE: Multidetector CT imaging of the chest was performed using the standard protocol  during bolus administration of intravenous contrast. Multiplanar CT image reconstructions and MIPs were obtained to evaluate the vascular anatomy.  CONTRAST:  11m OMNIPAQUE IOHEXOL 350 MG/ML SOLN  COMPARISON:  07/07/2015, 06/16/2014  FINDINGS: Mediastinum/Nodes: Cardiac enlargement. Minimal pericardial thickening or effusion. No filling defects in the pulmonary arterial system. Evaluation of the third order branches in the bilateral lower lobes is limited by both patient body habitus and significant lower lobe consolidation. No evidence of aortic dissection or dilatation. Cardiac pacer noted.  No defined lymph nodes identified but there is soft tissue right hilar and sub carinal fullness which may relate to lymphoid tissue.  Lungs/Pleura: Diffuse severe ground-glass attenuation relatively bilaterally symmetric, arm most severe in the lower lobes and relatively sparing the upper lobes. Air bronchograms extend from the perihilar regions to the periphery. There is at most trace pleural fluid  Upper abdomen: Partially visualized small upper pole left renal calculus  Musculoskeletal: No acute findings  Review of the MIP images confirms the above findings.  IMPRESSION: Study is limited by patient body habitus and by bilateral lower lobe consolidation. No pulmonary emboli detected but evaluation of third order branches particularly in the lower lobes is limited.  Severe bilaterally symmetric ground-glass consolidation. No significant pleural effusion. Appearance is similar to 06/16/2014. The apparent chronic nature and absence of pleural fluid suggests that this may not be due to pulmonary edema. Consider possibility of chronic lung disease including various entities related to interstitial pneumonitis as well as autoimmune disorders.   Electronically Signed   By: RSkipper ClicheM.D.   On: 07/08/2015 07:34  EKG Interpretation  Date/Time:  Saturday July 07 2015 22:22:29 EDT Ventricular Rate:  133 PR  Interval:  90 QRS Duration: 112 QT Interval:  332 QTC Calculation: 494 R Axis:   -95 Text Interpretation:  Atrial-sensed ventricular-paced rhythm Abnormal ECG Confirmed by Lita Mains  MD, DAVID ([id number]) on 07/08/2015 7:36:14 AM    MDM   Final diagnoses:  SOB (shortness of breath)  Hypotension, unspecified hypotension type     I personally performed the services described in this documentation, which was scribed in my presence. The recorded information has been reviewed and is accurate.   Patient with persistent hypotension despite IV fluids. Lactic acid is normal. Patient has known chronic lung disease and CHF. Low-grade temperature in the emergency department. Blood culture sent. Patient continues to be very well-appearing. Mentating well. Given broad-spectrum antibiotics for possible sepsis.  Discussed with family medicine was patient in emergency department and admit.  Julianne Rice, MD 07/08/15 [phone]

## 2015-07-08 NOTE — ED Notes (Signed)
Pt O2 down to 82% while repositioning in bed. Pt bumped up to 6L [place] and O2 saturation now at 99%.

## 2015-07-08 NOTE — ED Notes (Signed)
Family medicine at bedside.

## 2015-07-08 NOTE — ED Notes (Signed)
Patient transported to CT 

## 2015-07-08 NOTE — ED Notes (Signed)
Heart healthy/carb modified breakfast tray ordered for the patient.

## 2015-07-08 NOTE — ED Notes (Signed)
Dr. Ranae Palms aware of pts blood pressure, no new orders at this time.

## 2015-07-08 NOTE — ED Notes (Signed)
Attempted [place] IV x2 for CT angio, unsuccessful.

## 2015-07-08 NOTE — H&P (Signed)
[facility] Admission History and Physical Service Pager: [phone]  Patient name: Makayla Vasquez Medical record number: [id number] Date of birth: [birth year] Age: 34 y.o. Gender: female  Primary Care Provider: Nobie Putnam, DO Consultants: Heart Failure Code Status: FULL  Chief Complaint: SOB, weakness, fatigue  Assessment and Plan: DANTE COOTER is a 34 y.o. female presenting with SOB, weakness, fatigue for 2 weeks. Admitted for hypotension likely secondary to adrenal suppression from chronic steroid use. 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  1. Hypotension: Concerned that this is secondary to adrenal suppression /early adrenal crisis from chronic prednisone use with recent discontinuation of steroids. There was some concern that her hypotension may be due to sepsis from pneumonia or UTI, but this is less likely because her WBC count is within normal limits and her Tmax is 100.2, Lactate 1.8 . UA showed few bacteria, large Hgb, 15 ketones, trace leukocytes, positive nitrites, 0-2 WBC. Pt is having no symptoms of dysuria or urinary frequency/urgency. CXR shows chronically increased interstitial markings. CTA chest shows severe bilaterally symmetric ground-glass consolidation, chronic in nature.  - Admit to stepdown unit - Will get cortisol level now and an 8am cortisol on 9/19. - Will get am ACTH stimulation test at 1000 on 9/19 - Will hold Prednisone 74m qd and give Decadron 440mnow and then every 12 hours, because Decadron will not obscure the results of her 8am cortisol level. After cortisol and ACTH stim tests, will transition from Decadron to Hydrocortisone. Of note, pt. Was given Solucortef in the ED.  - Pt received 1L bolus x 3 in the ED for hypotension. In the setting of CHF, we do not want to fluid overload her so will  give NS at 60 ml/hr.  - F/u urine culture - Will monitor blood pressures closely. - Low threshold to consider sepsis as source of hypotension if she develops a fever, or begins to worsen in spite of steroid supplementation.   2. Shortness of breath: Likely an exacerbation of underlying chronic interstitial lung disease. On 4L O2 at home. Symptoms worsened after Prednisone dose was decreased to 60m260md. Acute infectious process is less likely because of a normal WBC count and Pt has been afebrile. Lungs sound clear on exam. Not requiring more than her baseline O2 - Given Levaquin and then Vanc/Zosyn in the ED. Will not continue abx at this time, because we do not think that she has a pneumonia. If her breathing worsens or she begins to develop fevers with increased WBC count, can consider restarting antibiotics for possible pneumonia. - Continue home Albuterol and Symbicort - Continue 4L O2 by nasal cannula. Titrat to keep O2 sats >90%. - Continuous pulse oximetry  3. Lupus: Recent diagnosis in 05/2015 - Will hold Prednisone 60mg60m  - Continue home CellCept for polyarthralgias  - Continue home med: Percocet 5-3260mg760mrs for severe pain  4. Congestive Heart Failure: Last ECHO (01/2015): EF 45-50%, diffuse hypokinesis, grade 1 diastolic dysfunction, mild mitral regurgitation, PA peak pressure 34 mmHg. BNP in the ED was 34. Cardiologist is Dr. BensiHaroldine Laws fluid overloaded to exam.  - Received 1L NS bolus x 3 in the ED. Will provide gentle hydration with NS 60ml/760mvernight. - Will hold Lasix for now in the setting of hypotension, restart in the am to avoid getting behind on fluid status.  - Heart Failure consult for any  recommendations in the setting of high dose steroids  5. Congenital complete AV block, with biventricular pacemaker: Tachycardic up to 130s in the ED, improved with 1L NS boluses x 3. HRs now in the 80s.  - Cardiac monitoring - Vitals per unit routine  6. T2DM, steroid-induced:  Last HgbA1c 8.5 (05/04/2015). Home regimen: Metformin 1027m bid, Lantus 35-50 units qhs. Glucose was 117 in the ED. - Lantus 20 units qhs - Moderate SSI - CBGs before meals and at bedtime - CBGs  7. HLD - Continue home Lipitor 460m 8. History of HTN - Holding home Bisprolol, Cozaar, and Spironolactone in the setting of hypotension.  FEN/GI: Heart healthy carb-modified diet, 1/2 MIVFs: NS @ 6073mr in the setting of CHF Prophylaxis: Lovenox  [facility]: [facility] attending Dr. WalMingo AmberHistory of Present Illness:  Makayla Vasquez a 34 25o. female with a PMH of lupus, chronic interstitial lung disease, congenital complete AV block with biventricular pacemaker, CHF, HTN, and bipolar disorder presenting with SOB that has worsened over the last 2 weeks. Had episode of acute shortness of breath last night when getting up to use bathroom. This brought her into the ED. She has a cough, but this is baseline for her. She has hard time breathing while lying flat, but this is also baseline for her. She denies worsening orthopnea. She has had increased sputum that is thick and clear in color. No sputum at baseline. She uses 4L O2 at home. She was diagnosed with Lupus 1 month ago. She had been on Prednisone for the last year for chronic interstitial lung disease, which was slowly tapered down to 5mg65md then discontinued on 6/30 by her Pulmonologist. She saw her PCP one month ago for weakness, fatigue, and worsening SOB and was restarted on Prednisone taper 06/05/2015. She then saw her Rheumatologist, 2 weeks later who kept her on 5mg 47mly, which she has been taking for the last 2-3 weeks. She noticed that the weakness, fatigue, and SOB returned after she was switched back to 5mg P25mnisone two weeks ago.  Endorses occasional mild headaches, chills, palpitations, mild nausea for the last 3 days, occasional diarrhea, general dull achy abdominal pain that is chronic, weakness,  fatigue.   No fevers, no dizziness or lightheadedness, no constipation, no dysuria, no pain with deep breaths, no back pain.   In the ED, she was hypotensive with systolics in the 60s-[id number] in the high 30s-5053X-67Swas also tachycardic to the 130s. Her BP is normally around 110/60-70. Her hypotension improved minimally despite receiving three 1L boluses. BMP was significant for Na 134, bicarb 21, glucose 117. CBC was unremarkable. BNP 34. UA showed few bacteria, 15 ketones, trace leukocytes, positive nitrites, 0-2 WBC. CXR showed chronic increased interstitial markings, slightly progressed from prior exam, increased mild cardiomegaly. Given her hypotension and tachycardia, there was some concern for sepsis so blood cultures were drawn and she was started on Levaquin initially and then Vanc/Zosyn. D-dimer was elevated, so CTA chest was performed and showed no PE, severe bilaterally symmetric ground glass consolidation similar to appearance in 05/2014. She was admitted for hypotension likely secondary to adrenal suppression in the setting of chronic steroid use.  Review Of Systems: Per HPI with the following additions: None Otherwise 12 point review of systems was performed and was unremarkable.  Patient Active Problem List   Diagnosis Date Noted  . Fatigue 06/05/2015  . Nausea with vomiting 06/05/2015  . Hyperlipidemia  associated with type 2 diabetes mellitus 05/04/2015  . Polyarthralgia 04/26/2015  . URI (upper respiratory infection) 04/19/2015  . Screening for cervical cancer 01/19/2015  . Onychomycosis of right great toe 12/14/2014  . Discoloration of skin 12/01/2014  . Lip abscess 11/02/2014  . CHF (congestive heart failure)   . Diabetes mellitus type 2 in obese   . Hyperglycemia 10/24/2014  . Dehydration   . Sinus tachycardia   . Chronic respiratory failure with hypoxia 08/07/2014  . Atrial tachycardia 07/26/2014  . Insertion of implantable subdermal contraceptive  07/12/2014  . Acute respiratory failure with hypoxia 06/19/2014  . ILD (interstitial lung disease) 06/19/2014  . Pulmonary infiltrates 06/17/2014  . Dyspnea 06/16/2014  . Hypoxemia 06/16/2014  . Congestive dilated cardiomyopathy 05/31/2014  . Nonallergic rhinitis 05/22/2014  . Vaginal itching 05/22/2014  . Pneumonia 05/09/2014  . Shortness of breath 05/08/2014  . Sterilization consult 04/20/2014  . Breast tenderness 01/18/2014  . Carpal tunnel syndrome 09/10/2012  . Constipation 03/03/2012  . Chronic systolic heart failure 15/40/0867  . Chest pain, unspecified 12/29/2011  . Post-nasal drip 10/24/2011  . Contraception 01/29/2011  . Heart palpitations   . PPM-Medtronic   . Congenital complete AV block   . Bipolar affective disorder   . Obesity   . GERD (gastroesophageal reflux disease)   . Hypertension 01/05/2011  . THYROGLOSSAL DUCT CYST 07/18/2010  . UNSPECIFIED CARDIAC DYSRHYTHMIA 05/07/2010  . RH FACTOR, NEGATIVE 04/08/2010  . Mild intermittent asthma without complication 61/95/0932  . 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 HEART BLOCK 12/17/2006  . IRRITABLE BOWEL SYNDROME 12/17/2006   Past Medical History: Past Medical History  Diagnosis Date  . Cardiac pacemaker     a. Since age 67 in 16. b. Upgrade to BiV in 2013.  . Congenital complete AV block   . Obesity   . GERD (gastroesophageal reflux disease)   . Asymptomatic LV dysfunction     a. Echo in Dec 2011 with EF 35 to 40%. Felt to be due to paced rhythm. b. EF 25-30% in 07/2014.  . Seizures     as a child- from high fever  . Anxiety   . Bipolar affective disorder   . Depression     bipolar  . Carpal tunnel syndrome of right wrist   . Asthma     seasonal allergies   . Arthritis     rheumatoid arthritis- mild, no rheumatology care   . Hypertension   . Pneumonitis     a. a/w hypoxia - inflammatory - large workup 07/2014.  [name] Sinus tachycardia   . Diabetes mellitus without complication   . Presence of permanent  cardiac pacemaker   . CHF (congestive heart failure)    Past Surgical History: Past Surgical History  Procedure Laterality Date  . Throat surgery  1994    s/p laser treatment  . Cesarean section    . Insert / replace / remove pacemaker      2001  . Cholecystectomy    . Iud removal  11/03/2011    Procedure: INTRAUTERINE DEVICE (IUD) REMOVAL;  Surgeon: [name], MD;  Location: [facility];  Service: Gynecology;  Laterality: N/A;  . Cyst excision  12/10/2012    THYROID  . Thyroglossal duct cyst N/A 12/10/2012    Procedure: REVISION OF THYROGLOSSAL DUCT CYST EXCISION;  Surgeon: [name], MD;  Location: [place];  Service: ENT;  Laterality: N/A;  Revision of Thyroglossal Duct Cyst Excision  . Nasal fracture surgery      /  w plate   . Carpal tunnel with cubital tunnel Right 07/26/2013    Procedure: RIGHT LIMITED OPEN CARPAL TUNNEL RELEASE ,  RIGHT CUBITAL TUNNEL RELEASE, INSITU VERSES ULNAR NERVE DECOMPRESSION AND ANTERIOR TRANSPOSITION;  Surgeon: [name], MD;  Location: [place];  Service: Orthopedics;  Laterality: Right;  . Video bronchoscopy Bilateral 06/19/2014    Procedure: VIDEO BRONCHOSCOPY WITHOUT FLUORO;  Surgeon: Brand Males, MD;  Location: [place];  Service: Cardiopulmonary;  Laterality: Bilateral;  . Bi-ventricular pacemaker upgrade N/A 03/08/2012    Procedure: BI-VENTRICULAR PACEMAKER UPGRADE;  Surgeon: [name], MD;  Location: [facility] CATH LAB;  Service: Cardiovascular;  Laterality: N/A;  . Atrial tach ablation N/A 08/14/2014    Procedure: ATRIAL TACH ABLATION;  Surgeon: [name], MD;  Location: [facility] CATH LAB;  Service: Cardiovascular;  Laterality: N/A;  . Right heart catheterization N/A 10/26/2014    Procedure: RIGHT HEART CATH;  Surgeon: [name], MD;  Location: [facility] CATH LAB;  Service: Cardiovascular;  Laterality: N/A;   Social History: Social History  Substance Use Topics  . Smoking status: Former Smoker -- 0.25 packs/day for .5 years    Types:  Cigarettes    Quit date: 07/25/1996  . Smokeless tobacco: Never Used  . Alcohol Use: No   Additional social history: None Please also refer to relevant sections of EMR.  Family History: Family History  Problem Relation Age of Onset  . Heart disease Mother     CHF (no details)  . Hypertension Mother   . Heart disease Father     Murmur  . Heart disease Sister 42     No details.  History of a pacemaker  . Lupus Mother    Allergies and Medications: Allergies  Allergen Reactions  . Sertraline Hcl Hives  . Tape Other (See Comments)    Burns skin   No current facility-administered medications on file prior to encounter.   Current Outpatient Prescriptions on File Prior to Encounter  Medication Sig Dispense Refill  . acetaminophen (TYLENOL) 500 MG tablet Take 1 tablet (500 mg total) by mouth 2 (two) times daily. 10 tablet 0  . albuterol (PROVENTIL HFA;VENTOLIN HFA) 108 (90 BASE) MCG/ACT inhaler Inhale 2 puffs into the lungs every 4 (four) hours as needed for wheezing or shortness of breath. 1 each 1  . atorvastatin (LIPITOR) 40 MG tablet Take 1 tablet (40 mg total) by mouth daily. 90 tablet 3  . bisoprolol (ZEBETA) 5 MG tablet TAKE 1/2 TABLET BY MOUTH DALY 15 tablet 3  . Blood Glucose Monitoring Suppl (ONE TOUCH ULTRA 2) W/DEVICE KIT Check blood sugar daily before breakfast 1 each 0  . budesonide-formoterol (SYMBICORT) 80-4.5 MCG/ACT inhaler Inhale 2 puffs into the lungs 2 (two) times daily as needed (for shortness of breath).     [name] etonogestrel (NEXPLANON) 68 MG IMPL implant 1 each by Subdermal route once.    . furosemide (LASIX) 20 MG tablet TAKE 1 TABLET BY MOUTH EVERY DAY FOR SHORTNESS OF BREATH AND/OR LEG SWELLING 30 tablet 3  . glucose blood (ONE TOUCH ULTRA TEST) test strip Check blood sugar daily before breakfast 100 each 0  . Insulin Glargine (LANTUS SOLOSTAR) 100 UNIT/ML Solostar Pen Inject 35-50 units subcutaneously daily, as instructed. 15 mL 2  . Insulin Pen Needle  (INSUPEN PEN NEEDLES) 32G X 4 MM MISC Inject insulin via insulin pen 1-2 x daily 50 each 5  . losartan (COZAAR) 25 MG tablet TAKE 1 TABLET BY MOUTH 2 TIMES DAILY. 60 tablet  0  . metFORMIN (GLUCOPHAGE) 1000 MG tablet TAKE 1 TABLET BY MOUTH TWICE A DAY WITH A MEAL 30 tablet 11  . mycophenolate (CELLCEPT) 500 MG tablet Take 500 mg by mouth 2 (two) times daily.    [name] oxyCODONE-acetaminophen (PERCOCET) 5-325 MG per tablet Take 1 tablet by mouth every 6 (six) hours as needed for severe pain. 60 tablet 0  . potassium chloride SA (KLOR-CON M20) 20 MEQ tablet TAKE 40 MEQ TODAY (2 TABLETS) THEN TAKE 20 MEQ ON DAYS THAT LASIX IS TAKEN 30 tablet 6  . spironolactone (ALDACTONE) 25 MG tablet TAKE 1 TABLET BY MOUTH EVERY DAY 30 tablet 1  . Nutritional Supplements (GLUCERNA ADVANCE SHAKE) LIQD Take supplement daily in place of 1 meal. (Patient not taking: Reported on 07/08/2015) 30 Bottle 2    Objective: BP 83/54 mmHg  Pulse 95  Temp(Src) 100.2 F (37.9 C) (Oral)  Resp 38  Ht _0  (1.651 m)  Wt 180 lb 8 oz (81.874 kg)  BMI 30.04 kg/m2  SpO2 98%  LMP 07/04/2015 Exam: General: Well-appearing female, laying in bed, pleasant and conversational, in NAD Eyes: EOMI, PERRLA, no scleral icterus ENTM: Oropharynx clear, MMM Neck: Supple Cardiovascular: Mild tachycardia, regular rhythm, no m/r/g Respiratory: CTAB with some increased breath sounds in lung bases bilaterally, no crackles or wheezes, nasal cannula in place, normal work of breathing Abdomen: +BS, soft, non-tender, non-distended MSK: Normal strength in upper and lower extremities, no edema Skin: Warm and well-perfused without rashes or lesions Neuro: Awake, alert, oriented, CN 2-12 grossly intact Psych: Appropriate mood and affect  Labs and Imaging: CBC BMET   Recent Labs Lab 07/08/15 0155  WBC 10.1  HGB 12.7  HCT 39.3  PLT 418*    Recent Labs Lab 07/08/15 0155  NA 134*  K 4.9  CL 102  CO2 21*  BUN 12  CREATININE 0.71  GLUCOSE  117*  CALCIUM 8.5*     CXR (9/18): chronic increased interstitial markings, slightly progressed from prior exam, increased mild cardiomegaly, CHF superimposed on chronic lung disease is considered CTA chest (9/18): No PE, severe bilaterally symmetric ground-glass consolidation, appearance similar to 05/2014  Sela Hua, MD 07/08/2015, 8:34 AM PGY-1, [place] Intern pager: [phone], text pages welcome  I agree with the above evaluation, assessment, and plan. Any correctional changes can be noted in [facility].   Aquilla Hacker, MD Family Medicine Resident - PGY 2

## 2015-07-08 NOTE — ED Notes (Signed)
Placed lunch tray order for patient.

## 2015-07-09 ENCOUNTER — Encounter (HOSPITAL_COMMUNITY): Payer: Self-pay | Admitting: Family Medicine

## 2015-07-09 ENCOUNTER — Inpatient Hospital Stay (HOSPITAL_COMMUNITY): Payer: Medicare Other

## 2015-07-09 DIAGNOSIS — I5032 Chronic diastolic (congestive) heart failure: Secondary | ICD-10-CM

## 2015-07-09 DIAGNOSIS — J9620 Acute and chronic respiratory failure, unspecified whether with hypoxia or hypercapnia: Secondary | ICD-10-CM

## 2015-07-09 DIAGNOSIS — I509 Heart failure, unspecified: Secondary | ICD-10-CM

## 2015-07-09 DIAGNOSIS — I5022 Chronic systolic (congestive) heart failure: Secondary | ICD-10-CM

## 2015-07-09 DIAGNOSIS — Q246 Congenital heart block: Secondary | ICD-10-CM | POA: Insufficient documentation

## 2015-07-09 DIAGNOSIS — I959 Hypotension, unspecified: Secondary | ICD-10-CM | POA: Insufficient documentation

## 2015-07-09 DIAGNOSIS — R0602 Shortness of breath: Secondary | ICD-10-CM | POA: Insufficient documentation

## 2015-07-09 DIAGNOSIS — D8989 Other specified disorders involving the immune mechanism, not elsewhere classified: Secondary | ICD-10-CM | POA: Insufficient documentation

## 2015-07-09 DIAGNOSIS — M329 Systemic lupus erythematosus, unspecified: Secondary | ICD-10-CM

## 2015-07-09 DIAGNOSIS — I950 Idiopathic hypotension: Secondary | ICD-10-CM

## 2015-07-09 LAB — COMPREHENSIVE METABOLIC PANEL
ALBUMIN: 2 g/dL — AB (ref 3.5–5.0)
ALK PHOS: 48 U/L (ref 38–126)
ALT: 58 U/L — AB (ref 14–54)
AST: 70 U/L — ABNORMAL HIGH (ref 15–41)
Anion gap: 7 (ref 5–15)
BUN: 10 mg/dL (ref 6–20)
CALCIUM: 7.8 mg/dL — AB (ref 8.9–10.3)
CHLORIDE: 107 mmol/L (ref 101–111)
CO2: 22 mmol/L (ref 22–32)
CREATININE: 0.63 mg/dL (ref 0.44–1.00)
GFR calc Af Amer: >60 mL/min (ref >60)
GFR calc non Af Amer: >60 mL/min (ref >60)
GLUCOSE: 182 mg/dL — AB (ref 65–99)
Potassium: 4.1 mmol/L (ref 3.5–5.1)
SODIUM: 136 mmol/L (ref 135–145)
Total Bilirubin: 0.6 mg/dL (ref 0.3–1.2)
Total Protein: 6 g/dL — ABNORMAL LOW (ref 6.5–8.1)

## 2015-07-09 LAB — ACTH STIMULATION, 3 TIME POINTS
CORTISOL 30 MIN: 13.6 ug/dL
Cortisol, 60 Min: 15.9 ug/dL
Cortisol, Base: 7.4 ug/dL

## 2015-07-09 LAB — CBC
HCT: 33 % — ABNORMAL LOW (ref 36.0–46.0)
HEMOGLOBIN: 10.2 g/dL — AB (ref 12.0–15.0)
MCH: 26.2 pg (ref 26.0–34.0)
MCHC: 30.9 g/dL (ref 30.0–36.0)
MCV: 84.6 fL (ref 78.0–100.0)
PLATELETS: 331 10*3/uL (ref 150–400)
RBC: 3.9 MIL/uL (ref 3.87–5.11)
RDW: 16.6 % — ABNORMAL HIGH (ref 11.5–15.5)
WBC: 6.5 10*3/uL (ref 4.0–10.5)

## 2015-07-09 LAB — LIPASE, BLOOD: LIPASE: 23 U/L (ref 22–51)

## 2015-07-09 LAB — GLUCOSE, CAPILLARY
GLUCOSE-CAPILLARY: 149 mg/dL — AB (ref 65–99)
Glucose-Capillary: 204 mg/dL — ABNORMAL HIGH (ref 65–99)
Glucose-Capillary: 173 mg/dL — ABNORMAL HIGH (ref 65–99)

## 2015-07-09 LAB — BRAIN NATRIURETIC PEPTIDE: B NATRIURETIC PEPTIDE 5: 177.4 pg/mL — AB (ref 0.0–100.0)

## 2015-07-09 LAB — CORTISOL-AM, BLOOD: Cortisol - AM: 6.6 ug/dL — ABNORMAL LOW (ref 6.7–22.6)

## 2015-07-09 MED ORDER — HYDROCORTISONE NA SUCCINATE PF 100 MG IJ SOLR
50.0000 mg | Freq: Three times a day (TID) | INTRAMUSCULAR | Status: AC
  Administered 2015-07-09 – 2015-07-11 (×6): 50 mg via INTRAVENOUS
  Filled 2015-07-09 (×6): qty 2

## 2015-07-09 MED ORDER — PANTOPRAZOLE SODIUM 40 MG PO TBEC
40.0000 mg | DELAYED_RELEASE_TABLET | Freq: Every day | ORAL | Status: DC
  Administered 2015-07-09 – 2015-07-15 (×7): 40 mg via ORAL
  Filled 2015-07-09 (×7): qty 1

## 2015-07-09 MED ORDER — COSYNTROPIN 0.25 MG IJ SOLR
0.2500 mg | Freq: Once | INTRAMUSCULAR | Status: AC
  Administered 2015-07-09: 0.25 mg via INTRAVENOUS
  Filled 2015-07-09: qty 0.25

## 2015-07-09 NOTE — Progress Notes (Signed)
Echocardiogram 2D Echocardiogram has been performed.  Makayla Vasquez 07/09/2015, 3:38 PM

## 2015-07-09 NOTE — Progress Notes (Signed)
Family Medicine Teaching Service Daily Progress Note Intern Pager: [phone]  Patient name: Makayla Vasquez Medical record number: [id number] Date of birth: 1981/01/28 Age: 34 y.o. Gender: female  Primary Care Provider: Nobie Putnam, DO Consultants:  Code Status: FULL  Pt Overview and Major Events to Date:  9/18: admitted for hypotension likely secondary to adrenal suppression from chronic steroid use with recent decrease in Prednisone dose  Assessment and Plan: Makayla Vasquez is a 34 y.o. female presenting with SOB, weakness, fatigue for 2 weeks. Admitted for hypotension likely secondary to adrenal suppression from chronic steroid use with recent decrease in Prednisone dose. 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  Hypotension: Concerned that this is secondary to adrenal suppression /early adrenal crisis from chronic prednisone use with recent discontinuation of steroids. There was some concern that her hypotension may be due to sepsis from pneumonia or UTI, but this is less likely because her WBC count is within normal limits and her Tmax is 100.2, Lactate 1.8 . UA showed few bacteria, large Hgb, 15 ketones, trace leukocytes, positive nitrites, 0-2 WBC. Pt is having no symptoms of dysuria or urinary frequency/urgency. CXR shows chronically increased interstitial markings. CTA chest shows severe bilaterally symmetric ground-glass consolidation, chronic in nature. S/p 1L bolus x 3 in ED Cortisol level on admission: 80.5 (9/18 PM). CBC wnl 9/19. BP range: 79-91/41-72 with MAP 55-67.  - Admited to stepdown unit - ACTH stimulation test: base 7.4, 30 min 13.6, 437mn 15.9 - after ACTH stimulation test, discontinued Decadron and start IV Hydrocortisone 587mq 8 hrs - F/u urine culture - Will monitor blood pressures closely. - Low threshold to consider sepsis as  source of hypotension if she develops a fever, or begins to worsen in spite of steroid supplementation.   Shortness of breath: Likely an exacerbation of underlying chronic interstitial lung disease. On 4L O2 at home. Symptoms worsened after Prednisone dose was decreased to 37m72md. Acute infectious process is less likely because of a normal WBC count and Pt has been afebrile. Lungs sound clear on exam. Not requiring more than her baseline O2 - Given Levaquin and then Vanc/Zosyn in the ED. Will not continue abx at this time, because we do not think that she has a pneumonia. If her breathing worsens or she begins to develop fevers with increased WBC count, can consider restarting antibiotics for possible pneumonia. RR: 20-35 (with recent 35 bpm) with good saturations on 4L [place]  - Continue home Albuterol and Symbicort - Continue 4L O2 by nasal cannula. Titrate to keep O2 sats >90%. - Continuous pulse oximetry  Abdominal Pain with diarrhea: 1-2 week history of crampy epigastric abdominal pain associated with watery non-bloody diarrhea. Hx of IBS but symptoms not similar to this per patient. Hx of heartburn. S/p cholecystectomy. LFT: alk phos nml, mild elevation of AST 70, ALT 58 although improving from admission. No diarrhea since admission.  - Order lipase - will start Protonix 79m74mily  - will monitor  Lupus: Recent diagnosis in 05/2015 - Will hold Prednisone 37mg 937m - Continue home CellCept for polyarthralgias  - Continue home med: Percocet 5-3237mg 43ms for severe pain  Congestive Heart Failure: Last ECHO (01/2015): EF 45-50%, diffuse hypokinesis, grade 1 diastolic dysfunction, mild mitral regurgitation, PA peak pressure 34 mmHg. BNP in the ED was 34. Cardiologist is Dr. BensimHaroldine Lawsfluid overloaded to exam.  - Received 1L  NS bolus x 3 in the ED. Will provide gentle hydration with NS 40m/hr overnight. - Lasix currently held 2/2 low/soft BP - Heart Failure consulted for any recommendations in  the setting of high dose steroids  Congenital complete AV block, with biventricular pacemaker: Tachycardic up to 130s in the ED, improved with 1L NS boluses x 3. HRs now in the 80s.  - Cardiac monitoring - Vitals per unit routine  T2DM, steroid-induced: Last HgbA1c 8.5 (05/04/2015). Home regimen: Metformin 10066mbid, Lantus 35-50 units qhs. Glucose was 117 in the ED. CBGs 145-182. - Lantus 20 units qhs - Moderate SSI - CBGs before meals and at bedtime  7. HLD - Continue home Lipitor 4032m8. History of HTN - Holding home Bisprolol, Cozaar, Spironolactone, Lasix in the setting of hypotension.  FEN/GI: Heart healthy carb-modified diet, 1/2 MIVFs: NS @ 59m37m in the setting of CHF Prophylaxis: Lovenox  Disposition: Admit to Family Medicine Teaching Service  Subjective:  Doing well this morning. Denies SOB, chest pain. Notes of 1-2 week history of abdominal pain that is crampy after eating mainly salads and sometimes fatty foods. This abdominal pain usually associated with diarrhea; pain is sometimes relieved with BM. Notes of chills which began 1 week ago, no fevers. Some nausea.   Objective: Temp:  [97.3 F (36.3 C)-97.9 F (36.6 C)] 97.6 F (36.4 C) (09/19 0758) Pulse Rate:  [58-118] 60 (09/19 0758) Resp:  [12-40] 35 (09/19 0758) BP: (74-101)/(34-74) 82/57 mmHg (09/19 0758) SpO2:  [86 %-100 %] 99 % (09/19 0758) Physical Exam: General: NAD resting Cardiovascular: RRR, no m/r/g Respiratory: CTAB with some decreased breath sounds in lung bases bilaterally, crackles noted on bases but resolved with cough Abdomen: + BS, nondistended, teneder to palpation  Extremities: no LE edema  Laboratory:  Recent Labs Lab 07/08/15 0155 07/09/15 0215  WBC 10.1 6.5  HGB 12.7 10.2*  HCT 39.3 33.0*  PLT 418* 331    Recent Labs Lab 07/08/15 0155 07/08/15 1329 07/09/15 0215  NA 134* 136 136  K 4.9 4.3 4.1  CL 102 107 107  CO2 21* 23 22  BUN _0 CREATININE 0.71 0.69 0.63   CALCIUM 8.5* 7.8* 7.8*  PROT  --  5.9* 6.0*  BILITOT  --  0.8 0.6  ALKPHOS  --  48 48  ALT  --  65* 58*  AST  --  97* 70*  GLUCOSE 117* 147* 182*    KaniSmiley Houseman 07/09/2015, 8:17 AM PGY-1, [facility] pager: [phone] pages welcome

## 2015-07-09 NOTE — Care Management Note (Signed)
Case Management Note  Patient Details  Name: Makayla Vasquez MRN: [id number] Date of Birth: 1980-12-03  Subjective/Objective:   Adm w pneumothorax                 Action/Plan:lives at home, pcp dr Althea Charon, has medicare and medicaid for meds   Expected Discharge Date:                  Expected Discharge Plan:  Home/Self Care  In-House Referral:     Discharge planning Services     Post Acute Care Choice:    Choice offered to:     DME Arranged:    DME Agency:     HH Arranged:    HH Agency:     Status of Service:     Medicare Important Message Given:    Date Medicare IM Given:    Medicare IM give by:    Date Additional Medicare IM Given:    Additional Medicare Important Message give by:     If discussed at Long Length of Stay Meetings, dates discussed:    Additional Comments: ur review done  Hanley Hays, RN 07/09/2015, 8:33 AM

## 2015-07-09 NOTE — Progress Notes (Signed)
Md notified. Clarification of order for ACTH stimulation lab.  No medication ordered to be given with this lab.  Per lab medication is given with this and pt is fasting.  Educated pt not to eat or drink anything until labs obtained.  Makayla Vasquez

## 2015-07-09 NOTE — Consult Note (Addendum)
Advanced Heart Failure Team Consult Note  Referring Physician: Dr. 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 Lovena Le. Initial work ups of lung disease in Aug-Sep of 2015 were negative and pt refused lung biopsy.  She was last seen in the HF clinic April 2016. She was stable on exam.  Echo that day showed improved to EF 40-45%.  She had an appointment with Dr Lovena Le 06/27/15 and was stable with her BiV PM working normally. She informed him at that visit that she had newly established with Dr. Kandice Robinsons in rheumatology and had been diagnosed with "Lupus and connective tissue disease." though no report could be found.  She presented to [facility] on 9/17 with increasing SOB x 2 weeks that is worse with exertion. She also c/o generalized arthralgias, chills, rhinorrhea, diarrhea after every meal, and productive cough. She denied sore throat, N/V, or peripheral edema.  Pertinent admission labs include K 4.9, Cr 0.71, WBC 10.1, Tmax100.2  UA positive for nitrites and Hgb. Urine and blood Cx pending.  CXR showed Chronic increased interstitial markings, however slightly progressed from prior exam. Increased mild cardiomegaly. CHF superimposed on chronic lung disease is considered. CT angio chest without PE but limited visualization of third order branches. Severe bilaterally symmetric ground-glass consolidation similar to scan in 05/2014.  She was noted to have persistent hypotension despite IVF and treated with broad-spectrum ABX for possible sepsis and admitted for same.  She says  she started feeling bad around the time her rheumatologist decrease her dose of prednisone from 10 to 5 daily.  She has only been taking her lasix "every once and a while" due to decrease in weight over the past 2-3 months.  She is on chronic 02 at home and has DOE with minimal exertion.  She is SOB with walking to the bathroom and has to rest before returning to her bed or chair.  She has not felt swollen or volume overloaded in some time.  She had an acute coughing episode on 9/17 that led to severe SOB that prompted her admission. Her legs are very weak with walking. The same thing happened to her mother, who was on chronic steroids for SLE.   Review of Systems: [y] = yes, _0  = no   General: Weight gain _1 ; Weight loss [y; Anorexia _2 ; Fatigue _3 ; Fever _4 ; Chills _5 ; Weakness _6   Cardiac: Chest pain/pressure _7 ; Resting SOB _8 ; Exertional SOB [y; Orthopnea _9 ; Pedal Edema _10 ; Palpitations _11 ; Syncope _12 ; Presyncope _13 ; Paroxysmal nocturnal dyspnea_14   Pulmonary: Cough [y; Wheezing_15 ; Hemoptysis_16 ; Sputum _17 ; Snoring _18   GI: Vomiting_19 ; Dysphagia_20 ; Melena_21 ; Hematochezia _22 ; Heartburn_23 ; Abdominal pain _24 ; Constipation _25 ; Diarrhea [y; BRBPR _26   GU: Hematuria_27 ; Dysuria _28 ; Nocturia_29   Vascular: Pain in legs with walking _30 ; Pain in feet with lying flat _31 ; Non-healing sores _32 ; Stroke _33 ; TIA _34 ; Slurred speech _35 ;  Neuro: Headaches_36 ; Vertigo_37 ; Seizures_38 ;  Paresthesias_0 ;Blurred vision _1 ; Diplopia _2 ; Vision changes _3   Ortho/Skin: Arthritis [y]; Joint pain [y]; Muscle pain _4 ; Joint swelling _5 ; Back Pain _6 ; Rash _7   Psych: Depression_8 ; Anxiety_9   Heme: Bleeding problems _10 ; Clotting disorders _11 ; Anemia _12   Endocrine: Diabetes [y]; Thyroid dysfunction_13   Home Medications Prior to Admission medications   Medication Sig Start Date End Date Taking? Authorizing Provider  acetaminophen (TYLENOL) 500 MG tablet Take 1 tablet (500 mg total)  by mouth 2 (two) times daily. 04/18/15  Yes Elberta Leatherwood, MD  albuterol (PROVENTIL HFA;VENTOLIN HFA) 108 (90 BASE) MCG/ACT inhaler Inhale 2 puffs into the lungs every 4 (four) hours as needed for wheezing or shortness of breath. 06/05/15  Yes Alexander J Karamalegos, DO  atorvastatin (LIPITOR) 40 MG tablet Take 1 tablet (40 mg total) by mouth daily. 05/04/15  Yes Alexander Devin Going, DO  bisoprolol (ZEBETA) 5 MG tablet TAKE 1/2 TABLET BY MOUTH DALY 03/29/15  Yes [name], MD  Blood Glucose Monitoring Suppl (ONE TOUCH ULTRA 2) W/DEVICE KIT Check blood sugar daily before breakfast 10/25/14  Yes Leone Brand, MD  budesonide-formoterol [facility]) 80-4.5 MCG/ACT inhaler Inhale 2 puffs into the lungs 2 (two) times daily as needed (for shortness of breath).  04/06/14  Yes Deneise Lever, MD  calcium-vitamin D (OSCAL WITH D) 500-200 MG-UNIT per tablet Take 1 tablet by mouth.   Yes Historical Provider, MD  etonogestrel (NEXPLANON) 68 MG IMPL implant 1 each by Subdermal route once.   Yes Historical Provider, MD  furosemide (LASIX) 20 MG tablet TAKE 1 TABLET BY MOUTH EVERY DAY FOR SHORTNESS OF BREATH AND/OR LEG SWELLING 05/18/15  Yes [name], DO  glucose blood (ONE TOUCH ULTRA TEST) test strip Check blood sugar daily before breakfast 10/25/14  Yes Leone Brand, MD  Insulin Glargine (LANTUS SOLOSTAR) 100 UNIT/ML Solostar Pen Inject 35-50 units subcutaneously daily, as instructed. 02/16/15  Yes Alexander Devin Going, DO  Insulin Pen Needle (INSUPEN PEN NEEDLES) 32G X 4 MM MISC Inject insulin via insulin pen 1-2 x daily 11/03/14  Yes Alexander J Karamalegos, DO  losartan (COZAAR) 25 MG tablet TAKE 1 TABLET BY MOUTH 2 TIMES DAILY. 05/11/15  Yes [name], MD  metFORMIN (GLUCOPHAGE) 1000 MG tablet TAKE 1 TABLET BY MOUTH TWICE A DAY WITH A MEAL 05/18/15  Yes [name], DO  mycophenolate (CELLCEPT) 500 MG tablet Take 500 mg by mouth 2 (two) times daily.   Yes Historical  Provider, MD  oxyCODONE-acetaminophen (PERCOCET) 5-325 MG per tablet Take 1 tablet by mouth every 6 (six) hours as needed for severe pain. 06/05/15  Yes Alexander Devin Going, DO  potassium chloride SA (KLOR-CON M20) 20 MEQ tablet TAKE 40 MEQ TODAY (2 TABLETS) THEN TAKE 20 MEQ ON DAYS THAT LASIX IS TAKEN 02/12/15  Yes [name], MD  predniSONE (DELTASONE) 5 MG tablet Take 5 mg by mouth daily with breakfast.   Yes Historical Provider, MD  spironolactone (ALDACTONE) 25 MG tablet TAKE 1 TABLET BY MOUTH EVERY DAY 04/24/15  Yes [name], MD  Nutritional Supplements ([place]) LIQD Take supplement daily in place of 1 meal. Patient not taking: Reported on 07/08/2015 11/03/14   [name], DO    Past Medical History: Past Medical History  Diagnosis Date  . Cardiac pacemaker     a. Since age 74 in 49. b. Upgrade to BiV in 2013.  [name]  Congenital complete AV block   . Obesity   . GERD (gastroesophageal reflux disease)   . Asymptomatic LV dysfunction     a. Echo in Dec 2011 with EF 35 to 40%. Felt to be due to paced rhythm. b. EF 25-30% in 07/2014.  . Seizures     as a child- from high fever  . Anxiety   . Bipolar affective disorder   . Depression     bipolar  . Carpal tunnel syndrome of right wrist   . Asthma     seasonal allergies   . Arthritis     rheumatoid arthritis- mild, no rheumatology care   . Hypertension   . Pneumonitis     a. a/w hypoxia - inflammatory - large workup 07/2014.  [name] Sinus tachycardia   . Diabetes mellitus without complication   . Presence of permanent cardiac pacemaker   . CHF (congestive heart failure)   . Lupus   . Lupus (systemic lupus erythematosus)     Past Surgical History: Past Surgical History  Procedure Laterality Date  . Throat surgery  1994    s/p laser treatment  . Cesarean section    . Insert / replace / remove pacemaker      2001  . Cholecystectomy    . Iud removal  11/03/2011    Procedure: INTRAUTERINE  DEVICE (IUD) REMOVAL;  Surgeon: [name], MD;  Location: [facility];  Service: Gynecology;  Laterality: N/A;  . Cyst excision  12/10/2012    THYROID  . Thyroglossal duct cyst N/A 12/10/2012    Procedure: REVISION OF THYROGLOSSAL DUCT CYST EXCISION;  Surgeon: [name], MD;  Location: [facility];  Service: ENT;  Laterality: N/A;  Revision of Thyroglossal Duct Cyst Excision  . Nasal fracture surgery      /w plate   . Carpal tunnel with cubital tunnel Right 07/26/2013    Procedure: RIGHT LIMITED OPEN CARPAL TUNNEL RELEASE ,  RIGHT CUBITAL TUNNEL RELEASE, INSITU VERSES ULNAR NERVE DECOMPRESSION AND ANTERIOR TRANSPOSITION;  Surgeon: [name], MD;  Location: [facility];  Service: Orthopedics;  Laterality: Right;  . Video bronchoscopy Bilateral 06/19/2014    Procedure: VIDEO BRONCHOSCOPY WITHOUT FLUORO;  Surgeon: Brand Males, MD;  Location: [facility];  Service: Cardiopulmonary;  Laterality: Bilateral;  . Bi-ventricular pacemaker upgrade N/A 03/08/2012    Procedure: BI-VENTRICULAR PACEMAKER UPGRADE;  Surgeon: [name], MD;  Location: [facility] CATH LAB;  Service: Cardiovascular;  Laterality: N/A;  . Atrial tach ablation N/A 08/14/2014    Procedure: ATRIAL TACH ABLATION;  Surgeon: [name], MD;  Location: [facility] CATH LAB;  Service: Cardiovascular;  Laterality: N/A;  . Right heart catheterization N/A 10/26/2014    Procedure: RIGHT HEART CATH;  Surgeon: [name], MD;  Location: [facility] CATH LAB;  Service: Cardiovascular;  Laterality: N/A;    Family History: Family History  Problem Relation Age of Onset  . Heart disease Mother     CHF (no details)  . Hypertension Mother   . Heart disease Father     Murmur  . Heart disease Sister 12     No details.  History of a pacemaker  . Lupus Mother     Social History: Social History   Social History  . Marital Status: Single    Spouse Name: N/A  . Number of Children: 1  . Years of Education: N/A   Occupational History  . CNA    Social  History Main Topics  . Smoking status: Former Smoker --  0.25 packs/day for .5 years    Types: Cigarettes    Quit date: 07/25/1996  . Smokeless tobacco: Never Used  . Alcohol Use: No  . Drug Use: No  . Sexual Activity: Yes    Birth Control/ Protection: Implant, IUD     Comment: placed 04/2011   Other Topics Concern  . None   Social History Narrative   Works at [employer].      Allergies:  Allergies  Allergen Reactions  . Sertraline Hcl Hives  . Tape Other (See Comments)    Burns skin    Objective:    Vital Signs:   Temp:  [97.3 F (36.3 C)-97.9 F (36.6 C)] 97.6 F (36.4 C) (09/19 0758) Pulse Rate:  [58-99] 60 (09/19 0758) Resp:  [12-40] 35 (09/19 0758) BP: (74-101)/(34-74) 82/57 mmHg (09/19 0758) SpO2:  [86 %-100 %] 99 % (09/19 0758) Last BM Date: 07/07/15  Weight change: Filed Weights   07/07/15 2233  Weight: 180 lb 8 oz (81.874 kg)    Intake/Output:   Intake/Output Summary (Last 24 hours) at 07/09/15 1006 Last data filed at 07/09/15 0745  Gross per 24 hour  Intake   1388 ml  Output    425 ml  Net    963 ml     Physical Exam: General:  Well appearing. No resp difficulty HEENT: 02 via [place] Neck: supple. JVP 7-8 cm. Carotids 2+ bilat; no bruits. No lymphadenopathy or thyromegaly appreciated. Cor: PMI nondisplaced. Regular rate & rhythm. No rubs, gallops or murmurs appreciated Lungs: Diminished bibasilarly Abdomen: soft, nontender, nondistended. No hepatosplenomegaly. No bruits or masses. +BS Extremities: no cyanosis, clubbing, rash, edema Neuro: alert & orientedx3, cranial nerves grossly intact. moves all 4 extremities w/o difficulty. Affect pleasant  Telemetry: A sensed V paced 60s  Labs: Basic Metabolic Panel:  Recent Labs Lab 07/08/15 0155 07/08/15 1329 07/09/15 0215  NA 134* 136 136  K 4.9 4.3 4.1  CL 102 107 107  CO2 21* 23 22  GLUCOSE 117* 147* 182*  BUN _0 CREATININE 0.71 0.69 0.63  CALCIUM 8.5* 7.8* 7.8*    Liver Function  Tests:  Recent Labs Lab 07/08/15 1329 07/09/15 0215  AST 97* 70*  ALT 65* 58*  ALKPHOS 48 48  BILITOT 0.8 0.6  PROT 5.9* 6.0*  ALBUMIN 2.0* 2.0*   No results for input(s): LIPASE, AMYLASE in the last 168 hours. No results for input(s): AMMONIA in the last 168 hours.  CBC:  Recent Labs Lab 07/08/15 0155 07/09/15 0215  WBC 10.1 6.5  HGB 12.7 10.2*  HCT 39.3 33.0*  MCV 83.3 84.6  PLT 418* 331    Cardiac Enzymes: No results for input(s): CKTOTAL, CKMB, CKMBINDEX, TROPONINI in the last 168 hours.  BNP: BNP (last 3 results)  Recent Labs  10/24/14 0930 07/08/15 0155  BNP 16.4 34.4    ProBNP (last 3 results) No results for input(s): PROBNP in the last 8760 hours.   CBG:  Recent Labs Lab 07/08/15 1639 07/08/15 2122 07/09/15 0756  GLUCAP 145* 161* 149*    Coagulation Studies: No results for input(s): LABPROT, INR in the last 72 hours.  Other results: EKG: 07/07/15 A-sensed v-paced 133 bpm  Imaging: Dg Chest 2 View  07/08/2015   CLINICAL DATA:  Productive cough and shortness of breath for several days.  EXAM: CHEST  2 VIEW  COMPARISON:  04/25/2015, multiple priors  FINDINGS: Multi lead left-sided pacemaker remains in place. Mild cardiomegaly, increased from prior. Chronically increased interstitial markings  are again seen, question progressed from prior exam. No confluent airspace disease, pleural effusion, or pneumothorax.  IMPRESSION: Chronic increased interstitial markings, however slightly progressed from prior exam. Increased mild cardiomegaly. CHF superimposed on chronic lung disease is considered.   Electronically Signed   By: Jeb Levering M.D.   On: 07/08/2015 00:12   Ct Angio Chest Pe W/cm &/or Wo Cm  07/08/2015   CLINICAL DATA:  Shortness of breath, Chest pain, and Cough for the last 2 weeks. D-dimer is 2.68. History of hypertension and pacemaker  EXAM: CT ANGIOGRAPHY CHEST WITH CONTRAST  TECHNIQUE: Multidetector CT imaging of the chest was  performed using the standard protocol during bolus administration of intravenous contrast. Multiplanar CT image reconstructions and MIPs were obtained to evaluate the vascular anatomy.  CONTRAST:  38m OMNIPAQUE IOHEXOL 350 MG/ML SOLN  COMPARISON:  07/07/2015, 06/16/2014  FINDINGS: Mediastinum/Nodes: Cardiac enlargement. Minimal pericardial thickening or effusion. No filling defects in the pulmonary arterial system. Evaluation of the third order branches in the bilateral lower lobes is limited by both patient body habitus and significant lower lobe consolidation. No evidence of aortic dissection or dilatation. Cardiac pacer noted.  No defined lymph nodes identified but there is soft tissue right hilar and sub carinal fullness which may relate to lymphoid tissue.  Lungs/Pleura: Diffuse severe ground-glass attenuation relatively bilaterally symmetric, arm most severe in the lower lobes and relatively sparing the upper lobes. Air bronchograms extend from the perihilar regions to the periphery. There is at most trace pleural fluid  Upper abdomen: Partially visualized small upper pole left renal calculus  Musculoskeletal: No acute findings  Review of the MIP images confirms the above findings.  IMPRESSION: Study is limited by patient body habitus and by bilateral lower lobe consolidation. No pulmonary emboli detected but evaluation of third order branches particularly in the lower lobes is limited.  Severe bilaterally symmetric ground-glass consolidation. No significant pleural effusion. Appearance is similar to 06/16/2014. The apparent chronic nature and absence of pleural fluid suggests that this may not be due to pulmonary edema. Consider possibility of chronic lung disease including various entities related to interstitial pneumonitis as well as autoimmune disorders.   Electronically Signed   By: RSkipper ClicheM.D.   On: 07/08/2015 07:34     Medications:     Current Medications: . atorvastatin  40 mg Oral  Daily  . calcium-vitamin D  1 tablet Oral Q breakfast  . dexamethasone  4 mg Intravenous Q12H  . enoxaparin (LOVENOX) injection  40 mg Subcutaneous Q24H  . insulin aspart  0-15 Units Subcutaneous TID WC  . insulin aspart  0-5 Units Subcutaneous QHS  . insulin glargine  20 Units Subcutaneous Q2200  . mycophenolate  500 mg Oral BID  . potassium chloride SA  20 mEq Oral Daily  . 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 The HF team was consulted for optimal management of her HF meds.  1. 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 - ACTH stimulation in for today. Cortisol 80.5 ug/dL this am. - Work up for sepsis negative thus far, with only mild positive UA. -  Urine and Blood Cx pending. No further fevers 2. Lupus - Diagnosed 05/2015 - Per IM 3. Acute on chronic respiratory failure with diffuse lung infiltrates of unknown etiology - CT several bilateral consolidation, but relatively unchanged from 05/2014 - Stable currently on chronic 02 of 4L via [place] 4. Chronic systolic HF: 0/0634 EF 94-94%/ Medtronic device.  - RHC in 10/2013 with low filling pressures and adequate output.  - Will repeat echo - She is not volume overloaded on exam. I/O positive thus far with IVF for hypotension.  Will monitor. - Weight down from previous baseline at 180 lbs today.  - Home doses of Lasix 20 mg, bisoprolol 2.5 mg daily, Losartan to 25 mg bid, and spironolactone 25 mg daily all held on admission for hypotension.  Ran out of medications 1-2 days prior to admission due to financial issues. -  Had been taking Lasix "every now and then" as her weight has been going down over past couple of months. - Has Nexplanon in place RUE for pregnancy prevention. 5. Congenital AVN block s/p pacer - Stable per EP visit on 06/27/15 - Will have Medtronic interrogate. 6. Diabetes - Per IM  She is not volume overloaded on exam and last echo with near normal EF. Will repeat echo.  She is not currently complaining of any more than her baseline SOB. Continue to hold HF meds for now. Will watch fluid status closely.  Length of Stay: 1  Shirley Friar PA-C 07/09/2015, 10:06 AM  Advanced Heart Failure Team Pager [phone] (M-F; 7a - 4p)  Please contact [facility] for night-coverage after hours (4p -7a ) and weekends on [email or web address]  Patient seen with PA, agree with the above note.   1. Acute on chronic respiratory failure: Admitted with dyspnea.  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  Possible flare of lupus pneumonitis in setting of decreased steroid dose.  She does not appear markedly volume overloaded on exam, JVP 7-8 cm.  She is in sinus rhythm.  - Agree with stress dose steroids.  - Check Optivol on Medtronic CRT-D device to confirm volume status.  2. Chronic systolic CHF: Last echo with EF 45-50% (improved).  As above, on exam she does not look markedly volume overloaded.  - Optivol check as above.  - Holding cardiac meds for now with hypotension.  - Echo - BNP 3. Hypotension: Mild with SBP now in 90s.  Cardiac meds held for now.  Adrenal insufficiency with decrease in steroid dose may be playing a role.  Currently afebrile, WBCs not elevated.  Not currently on antibiotics.  Will follow culture data.   Loralie Champagne 07/09/2015 1:37 PM

## 2015-07-10 DIAGNOSIS — I5033 Acute on chronic diastolic (congestive) heart failure: Secondary | ICD-10-CM

## 2015-07-10 DIAGNOSIS — I9589 Other hypotension: Secondary | ICD-10-CM

## 2015-07-10 DIAGNOSIS — E2740 Unspecified adrenocortical insufficiency: Secondary | ICD-10-CM

## 2015-07-10 LAB — GLUCOSE, CAPILLARY
GLUCOSE-CAPILLARY: 160 mg/dL — AB (ref 65–99)
Glucose-Capillary: 168 mg/dL — ABNORMAL HIGH (ref 65–99)
Glucose-Capillary: 131 mg/dL — ABNORMAL HIGH (ref 65–99)
Glucose-Capillary: 166 mg/dL — ABNORMAL HIGH (ref 65–99)

## 2015-07-10 LAB — URINE CULTURE

## 2015-07-10 LAB — BASIC METABOLIC PANEL
ANION GAP: 7 (ref 5–15)
BUN: 13 mg/dL (ref 6–20)
CHLORIDE: 112 mmol/L — AB (ref 101–111)
CO2: 23 mmol/L (ref 22–32)
Calcium: 8.7 mg/dL — ABNORMAL LOW (ref 8.9–10.3)
Creatinine, Ser: 0.7 mg/dL (ref 0.44–1.00)
GFR calc Af Amer: >60 mL/min (ref >60)
GFR calc non Af Amer: >60 mL/min (ref >60)
GLUCOSE: 172 mg/dL — AB (ref 65–99)
POTASSIUM: 3.8 mmol/L (ref 3.5–5.1)
Sodium: 142 mmol/L (ref 135–145)

## 2015-07-10 MED ORDER — FUROSEMIDE 10 MG/ML IJ SOLN
40.0000 mg | Freq: Two times a day (BID) | INTRAMUSCULAR | Status: DC
  Administered 2015-07-10 – 2015-07-11 (×4): 40 mg via INTRAVENOUS
  Filled 2015-07-10 (×4): qty 4

## 2015-07-10 MED ORDER — BISOPROLOL FUMARATE 5 MG PO TABS
2.5000 mg | ORAL_TABLET | Freq: Every day | ORAL | Status: DC
  Administered 2015-07-10 – 2015-07-15 (×5): 2.5 mg via ORAL
  Filled 2015-07-10: qty 0.5
  Filled 2015-07-10 (×2): qty 1
  Filled 2015-07-10 (×2): qty 0.5
  Filled 2015-07-10: qty 1
  Filled 2015-07-10: qty 0.5

## 2015-07-10 MED ORDER — SPIRONOLACTONE 25 MG PO TABS
25.0000 mg | ORAL_TABLET | Freq: Every day | ORAL | Status: DC
  Administered 2015-07-10 – 2015-07-11 (×2): 25 mg via ORAL
  Filled 2015-07-10 (×2): qty 1

## 2015-07-10 NOTE — Progress Notes (Signed)
Advanced Heart Failure Rounding Note  Primary Physician: Hadassah Pais, DO Primary Cardiologist: Dr Gala Romney  Subjective:    Feels good.  Back to her baseline and wants to go home.  No SOB, dizziness, or CP.   BPs improved overnight.  Now in 120s SBP.  On stress dosing of steroids per primary team.   Objective:   Weight Range: 180 lb 8 oz (81.874 kg) Body mass index is 30.04 kg/(m^2).   Vital Signs:   Temp:  [97.6 F (36.4 C)-98.1 F (36.7 C)] 97.7 F (36.5 C) (09/20 0752) Pulse Rate:  [59-80] 60 (09/20 0752) Resp:  [17-37] 33 (09/20 0752) BP: (94-126)/(41-77) 121/69 mmHg (09/20 0752) SpO2:  [96 %-100 %] 99 % (09/20 0752) Last BM Date: 07/07/15  Weight change: Filed Weights   07/07/15 2233  Weight: 180 lb 8 oz (81.874 kg)    Intake/Output:   Intake/Output Summary (Last 24 hours) at 07/10/15 0904 Last data filed at 07/09/15 2342  Gross per 24 hour  Intake    820 ml  Output   1400 ml  Net   -580 ml     Physical Exam: General: Well appearing. No resp difficulty HEENT: 02 via [place] Neck: supple. JVP 8 cm. Carotids 2+ bilat; no bruits. No lymphadenopathy or thyromegaly noted. Cor: PMI nondisplaced. RRR. No rubs, gallops or murmurs appreciated Lungs: Diminished  Abdomen: soft, NT, ND. No hepatosplenomegaly. No bruits or masses. +BS Extremities: no cyanosis, clubbing, rash, trace ankle edema Neuro: alert & orientedx3, cranial nerves grossly intact. moves all 4 extremities w/o difficulty. Affect flat   Telemetry: A sensed V paced 60s  Labs: CBC  Recent Labs  07/08/15 0155 07/09/15 0215  WBC 10.1 6.5  HGB 12.7 10.2*  HCT 39.3 33.0*  MCV 83.3 84.6  PLT 418* 331   Basic Metabolic Panel  Recent Labs  07/08/15 1329 07/09/15 0215  NA 136 136  K 4.3 4.1  CL 107 107  CO2 23 22  GLUCOSE 147* 182*  BUN 9 10  CALCIUM 7.8* 7.8*   Liver Function Tests  Recent Labs  07/08/15 1329 07/09/15 0215  AST 97* 70*  ALT 65* 58*  ALKPHOS 48 48   BILITOT 0.8 0.6  PROT 5.9* 6.0*  ALBUMIN 2.0* 2.0*    Recent Labs  07/09/15 1322  LIPASE 23   Cardiac Enzymes No results for input(s): CKTOTAL, CKMB, CKMBINDEX, TROPONINI in the last 72 hours.  BNP: BNP (last 3 results)  Recent Labs  10/24/14 0930 07/08/15 0155 07/09/15 1430  BNP 16.4 34.4 177.4*    ProBNP (last 3 results) No results for input(s): PROBNP in the last 8760 hours.   D-Dimer  Recent Labs  07/08/15 0240  DDIMER 2.68*   Hemoglobin A1C No results for input(s): HGBA1C in the last 72 hours. Fasting Lipid Panel No results for input(s): CHOL, HDL, LDLCALC, TRIG, CHOLHDL, LDLDIRECT in the last 72 hours. Thyroid Function Tests No results for input(s): TSH, T4TOTAL, T3FREE, THYROIDAB in the last 72 hours.  Invalid input(s): FREET3  Other results:     Imaging/Studies:   No results found.  Latest Echo  Latest Cath   Medications:     Scheduled Medications: . atorvastatin  40 mg Oral Daily  . calcium-vitamin D  1 tablet Oral Q breakfast  . enoxaparin (LOVENOX) injection  40 mg Subcutaneous Q24H  . hydrocortisone sod succinate (SOLU-CORTEF) inj  50 mg Intravenous Q8H  . insulin aspart  0-15 Units Subcutaneous TID WC  . insulin aspart  0-5  Units Subcutaneous QHS  . insulin glargine  20 Units Subcutaneous Q2200  . mycophenolate  500 mg Oral BID  . pantoprazole  40 mg Oral Daily  . potassium chloride SA  20 mEq Oral Daily  . 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 The HF team was consulted for optimal management of her HF meds.  1. Hypotension - Improved on stress dosing of hydrocortisone IV - IM believes 2/2 adrenal suppression/early adrenal crisis from chronic prednisone use with recent decrease of steroids. - Adrenal work up per primary team. - Work up for sepsis negative thus far, with only mildly positive UA. - Blood Cx no growth to date. Urine culture "reincubated for better growth" as of 9/19. No further fevers 2. Lupus - Diagnosed 05/2015 - Per IM 3. Acute on chronic respiratory failure with diffuse lung infiltrates of unknown etiology - CT several bilateral consolidation, but relatively unchanged from 05/2014 - Stable currently on chronic 02 of 4L via [place] - Received levaquin and then Vanc/Zosyn in ED.  Now off ABX. 4. Chronic systolic HF: 07/09/15 EF 50-55%/ Medtronic device.  - Despite appearing euvolemic on exam, Optivol markedly elevated. Will give 40 mg IV lasix BID today and reassess. - RHC in 10/2013 with low filling pressures and adequate output.  - Repeat echo this admit EF 50-55% (improved from 45-50% in April) - She is not volume overloaded on exam. I/O relatively balanced thus far with IVF for hypotension. Will monitor. - Weight down from previous baseline at 180 lbs on admit - Restart bisoprolol 2.5 mg daily. Will add back losartan and spiro as able. - Has Nexplanon in place RUE for pregnancy prevention. - Will add daily weights and I/Os 5. Congenital AVN block s/p pacer - Stable per EP visit on 06/27/15 6. Diabetes - Per IM  She is not volume overloaded on exam, but Optivol shows markedly increased threshold.  Will give IV lasix today and reassess in am. Repeat Echo with near normal EF of 50-55%. She is not currently complaining of any more than her baseline SOB. Will continue to add HF meds back on as tolerated.   Length of Stay: 2   Graciella Freer PA-C 07/10/2015, 9:04 AM  Advanced Heart Failure  Team Pager [phone] (M-F; 7a - 4p)  Please contact [facility] for night-coverage after hours (4p -7a ) and weekends on [email or web address]   Patient seen with PA, agree with the above note.  1. Acute on chronic respiratory failure: Admitted with dyspnea. 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 She does not appear markedly volume overloaded on exam, but Optivol shows fluid index > threshold with low impedance.   - Will give Lasix 40 mg IV bid today.   2. Chronic systolic CHF: Echo this admission with EF 50-55% (improved from the past).  Has Medtronic CRT-P system. As above, volume overload by Optivol though exam difficult. BP much improved today.  BNP at 177, higher than her prior reading. - With improved BP, begin to start back cardiac meds.  Will give bisoprolol and spironolactone today, start back on losartan tomorrow if stable. - Lasix 40 mg IV bid today, probably back to po tomorrow.   3. Hypotension: Resolved on stress dose steroids. Suspect adrenal insufficiency with decrease in her chronic steroid dose. No evidence for septic shock and do not think cardiogenic.    [name] 07/10/2015

## 2015-07-10 NOTE — Progress Notes (Signed)
[facility] PCP Visit - Progress Note  I have seen the patient and discussed current hospitalization. Makayla Vasquez is in good spirits this morning and overall seems to be doing better since admission, vitals significantly improved on hydrocortisone overnight. Although she still admits to similar generalized "weakness" especially in bilateral lower extremities at times making her feel like her legs "may give out on me", also still endorsing some arthralgias. We was agreeable to having PT/OT evaluate her to see if she needed any outpatient or more intensive therapy.  Additionally we discussed her recent outpatient clinical course leading up to this hospitalization, and she feels like she seemed to respond to the initial prednisone burst and taper in August. She continued to take the Cellcept from Rheumatology, did follow-up with them on 06/18/15 but she did not see any significant difference in her arthralgias on the cellcept, at that time they had decreased her prednisone dose to 5mg , and since then she gradually worsened until admission. Ultimately, her likely adrenal suppression has been due to her chronic course over past >1 year, and pending further work-up and treatment on current admission, our goal is to find an appropriate regimen for her to go home on. I encouraged her to continue to follow-up with Rheumatology at this time.  I agree with the current hospital plan outlined by the primary team of [facility] Family Medicine Teaching Service and want to thank them for their continued excellent care and efforts in caring for my patient. I will anticipate to follow-up with the patient in the clinic after discharge from hospital.  [facility], DO [facility], PGY-3

## 2015-07-10 NOTE — Evaluation (Signed)
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 She has weak quadriceps and hip flexors with education for HEp and encouragement to perform daily along with gait. Pt reports she put herself on 4L [place] a few months ago and currently dropped to 80% on 4L with gait without significant SOB, at rest sats 98% on 2L. Encouraged pulse oximeter for home use. Pt has had difficulty getting up from tub at home and [facility] for shower seat would decrease burden of care. Pt aware and educated for all above. No further acute therapy needs at this time and recommend OPPT for increased strengthening and endurance.     Follow Up Recommendations Outpatient PT    Equipment Recommendations  3in1 (PT)    Recommendations for Other Services       Precautions / Restrictions Precautions Precaution Comments: watch sats      Mobility  Bed Mobility Overal bed mobility: Modified Independent                Transfers Overall transfer level: Modified independent                  Ambulation/Gait Ambulation/Gait assistance: Independent Ambulation Distance (Feet): 500 Feet Assistive device: None Gait Pattern/deviations: WFL(Within Functional Limits)   Gait velocity interpretation: at or above normal speed for age/gender    Stairs            Wheelchair Mobility    Modified Rankin (Stroke Patients  Only)       Balance                                             Pertinent Vitals/Pain Pain Assessment: No/denies pain  HR 62-76    Home Living Family/patient expects to be discharged to:: Private residence Living Arrangements: Spouse/significant other Available Help at Discharge: Friend(s);Family;Available PRN/intermittently Type of Home: Apartment Home Access: Stairs to enter   Entrance Stairs-Number of Steps: 2 short steps Home Layout: One level Home Equipment: None      Prior Function Level of Independence: Needs assistance   Gait / Transfers Assistance Needed: pt states she needs assist to get out of tub at home, walks on her own but limited distance when legs fatigue  ADL's / Homemaking Assistance Needed: significant other assists with the cooking and household        Hand Dominance        Extremity/Trunk Assessment   Upper Extremity Assessment: Overall WFL for tasks assessed           Lower Extremity Assessment: RLE deficits/detail;LLE deficits/detail RLE Deficits / Details: hip flexion 3/5, knee extension 3/5, knee flexion 5/5, dorsiflexion 5/5 LLE Deficits / Details: hip flexion 3/5, knee extension 3/5, knee flexion 5/5, dorsiflexion 5/5  Cervical / Trunk Assessment: Normal  Communication   Communication: No difficulties  Cognition Arousal/Alertness: Awake/alert Behavior During Therapy: WFL for tasks assessed/performed Overall Cognitive  Status: Within Functional Limits for tasks assessed                      General Comments      Exercises General Exercises - Lower Extremity Long Arc Quad: AROM;Seated;Both;15 reps Hip Flexion/Marching: AROM;Seated;Both;15 reps      Assessment/Plan    PT Assessment All further PT needs can be met in the next venue of care  PT Diagnosis Generalized weakness   PT Problem List Decreased strength;Decreased activity tolerance  PT Treatment Interventions     PT Goals (Current goals  can be found in the Care Plan section) Acute Rehab PT Goals Patient Stated Goal: return home PT Goal Formulation: All assessment and education complete, DC therapy    Frequency     Barriers to discharge        Co-evaluation               End of Session   Activity Tolerance: Patient tolerated treatment well Patient left: in chair;with call bell/phone within reach;with nursing/sitter in room Nurse Communication: Mobility status         Time: 1901-2224 PT Time Calculation (min) (ACUTE ONLY): 27 min   Charges:   PT Evaluation $Initial PT Evaluation Tier I: 1 Procedure PT Treatments $Therapeutic Exercise: 8-22 mins   PT G CodesMelford Aase 07/10/2015, 12:11 PM Elwyn Reach, [place]

## 2015-07-10 NOTE — Progress Notes (Signed)
Family Medicine Teaching Service Daily Progress Note Intern Pager: [phone]  Patient name: Makayla Vasquez Medical record number: [id number] Date of birth: Mar 10, 1981 Age: 34 y.o. Gender: female  Primary Care Provider: Nobie Putnam, DO Consultants: HF  Code Status: FULL  Pt Overview and Major Events to Date:  9/18: admitted for hypotension likely secondary to adrenal suppression from chronic steroid use with recent decrease in Prednisone dose. Started on [place] until completion of ACTH Stimulation test 9/19: Transitioned to Hydrocortisone, with improvement of BPs  Assessment and Plan: RETINA [name] is a 34 y.o. female presenting with SOB, weakness, fatigue for 2 weeks. Admitted for hypotension likely secondary to adrenal suppression from chronic steroid use with recent decrease in Prednisone dose. 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  Hypotension: Concerned that this is secondary to adrenal suppression /early adrenal crisis from chronic prednisone use with recent discontinuation of steroids. There was some concern that her hypotension may be due to sepsis from pneumonia or UTI, but this is less likely because her WBC count is within normal limits and her Tmax is 100.2, Lactate 1.8 . CXR shows chronically increased interstitial markings. CTA chest shows severe bilaterally symmetric ground-glass consolidation, chronic in nature. S/p 1L bolus x 3 in ED. CBC wnl 9/19. ACTH stimulation test positive: base 7.4, 30 min 13.6, 80mn 15.9. BPs improved overnight up to 120s/70s. Her improvement with steroids supports likely cause of adrenal suppression from chronic prednisone use over an infectious process. Additionally patient has been afebrile.  - Admited to stepdown unit - IV Hydrocortisone 556mq 8 hrs; consider transition to PO steroid - Blood cultures: NGTD (1  day) - Will monitor blood pressures closely.   Shortness of breath: Likely an exacerbation of underlying chronic interstitial lung disease. On 4L O2 at home. Symptoms worsened after Prednisone dose was decreased to 52m10md. Acute infectious process is less likely because of a normal WBC count and Pt has been afebrile. Lungs sound clear on exam. Not requiring more than her baseline O2 - Given Levaquin and then Vanc/Zosyn in the ED. Will not continue abx at this time, because we do not think that she has a pneumonia. - Continue home Albuterol and Symbicort - Continue 4L O2 by nasal cannula. Titrate to keep O2 sats >90%. - Continuous pulse oximetry  Abdominal Pain with diarrhea: 1-2 week history of crampy epigastric abdominal pain associated with watery non-bloody diarrhea. Hx of IBS but symptoms not similar to this per patient. Hx of heartburn. S/p cholecystectomy. LFT: alk phos nml, mild elevation of AST 70, ALT 58 although improving from admission. No diarrhea since admission. Lipase wnl. Currently not concerning for infectious process.  - continue Protonix 35m752mily  - will monitor  Generalized LE Weakness: Per patient this is chronic in nature, Neuro exam was unremarkable today - PT/OT consult   Lupus: Recent diagnosis in 05/2015 - Will hold Prednisone 52mg 19m - Continue home CellCept for polyarthralgias  - Continue home med: Percocet 5-3252mg 37ms for severe pain  Diastolic Congestive Heart Failure: Last ECHO (01/2015): EF 45-50%, diffuse hypokinesis, grade 1 diastolic dysfunction, mild mitral regurgitation, PA peak pressure 34 mmHg.  Cardiologist is Dr. BensimHaroldine Lawsfluid overloaded to exam. Received 1L NS bolus x 3 in the ED. BNP in the ED was 34-> 177.4. Repeat ECHO: EF 50%, G2DD, mild MR/TR, mild elevation of pulmonary pressure, small pericardial effusion. - Lasix  currently held 2/2 low/soft BP - will follow up on Heart Failure reccs, appreciate reccs: start Bisoprolol 2.56m  daily  Congenital complete AV block, with biventricular pacemaker: Tachycardic up to 130s in the ED, improved with 1L NS boluses x 3. HRs now in the 80s.  - Cardiac monitoring - Vitals per unit routine - per HF will interrogate pacemaker  T2DM, steroid-induced: Last HgbA1c 8.5 (05/04/2015). Home regimen: Metformin 10066mbid, Lantus 35-50 units qhs. Glucose was 117 in the ED. CBGs 149-204, with AM CBG 172. Required 12 units Novolog over 24 hours - Lantus 20 units qhs - Moderate SSI - CBGs before meals and at bedtime   HLD - Continue home Lipitor 4044m History of HTN - Holding home Bisprolol, Cozaar, Spironolactone, Lasix in the setting of hypotension.  Asymptomatic Bacteriuria:   UA showed few bacteria, large Hgb, 15 ketones, trace leukocytes, positive nitrites, 0-2 WBC. Patient is asymptomatic. Urine culture collected on admission and is pending - no abx tx indicated  FEN/GI: Heart healthy carb-modified diet, 1/2 MIVFs: NS @ 66m51m in the setting of CHF Prophylaxis: Lovenox  Disposition: Admit to Family Medicine Teaching Service  Subjective:  Patient states she is doing well this morning. No concerns today. Notes of general weakness in LE she has been having for the past few weeks. States she sometimes feels legs may give out.   Objective: Temp:  [97.6 F (36.4 C)-98.1 F (36.7 C)] 97.7 F (36.5 C) (09/20 0427) Pulse Rate:  [59-80] 59 (09/20 0600) Resp:  [17-37] 25 (09/20 0600) BP: (82-126)/(41-77) 121/71 mmHg (09/20 0600) SpO2:  [96 %-100 %] 99 % (09/20 0600) Physical Exam: General: NAD resting Cardiovascular: RRR, no m/r/g, no JVD or LE edema Respiratory: CTAB with some decreased breath sounds in lung bases bilaterally, fine crackles noted on bases Abdomen: + BS, nondistended, teneder to palpation  Extremities: no LE edema; 5/5 strength in UE and LE, normal sensation to light touch in LE.   Laboratory:  Recent Labs Lab 07/08/15 0155 07/09/15 0215  WBC 10.1 6.5   HGB 12.7 10.2*  HCT 39.3 33.0*  PLT 418* 331    Recent Labs Lab 07/08/15 0155 07/08/15 1329 07/09/15 0215  NA 134* 136 136  K 4.9 4.3 4.1  CL 102 107 107  CO2 21* 23 22  BUN _0 CREATININE 0.71 0.69 0.63  CALCIUM 8.5* 7.8* 7.8*  PROT  --  5.9* 6.0*  BILITOT  --  0.8 0.6  ALKPHOS  --  48 48  ALT  --  65* 58*  AST  --  97* 70*  GLUCOSE 117* 147* 182*     KaniSmiley Houseman 07/10/2015, 6:59 AM PGY-1, [facility] pager: [phone] pages welcome

## 2015-07-11 LAB — BASIC METABOLIC PANEL
ANION GAP: 7 (ref 5–15)
BUN: 14 mg/dL (ref 6–20)
CALCIUM: 8 mg/dL — AB (ref 8.9–10.3)
CHLORIDE: 103 mmol/L (ref 101–111)
CO2: 26 mmol/L (ref 22–32)
CREATININE: 0.81 mg/dL (ref 0.44–1.00)
GFR calc non Af Amer: >60 mL/min (ref >60)
Glucose, Bld: 240 mg/dL — ABNORMAL HIGH (ref 65–99)
Potassium: 3.6 mmol/L (ref 3.5–5.1)
SODIUM: 136 mmol/L (ref 135–145)

## 2015-07-11 LAB — GLUCOSE, CAPILLARY
GLUCOSE-CAPILLARY: 137 mg/dL — AB (ref 65–99)
GLUCOSE-CAPILLARY: 258 mg/dL — AB (ref 65–99)
GLUCOSE-CAPILLARY: 169 mg/dL — AB (ref 65–99)
GLUCOSE-CAPILLARY: 232 mg/dL — AB (ref 65–99)

## 2015-07-11 MED ORDER — LOSARTAN POTASSIUM 25 MG PO TABS
12.5000 mg | ORAL_TABLET | Freq: Two times a day (BID) | ORAL | Status: DC
  Administered 2015-07-11 (×2): 12.5 mg via ORAL
  Filled 2015-07-11 (×2): qty 1

## 2015-07-11 MED ORDER — SPIRONOLACTONE 25 MG PO TABS: 25.0000 mg | ORAL_TABLET | Freq: Every day | ORAL | Status: DC

## 2015-07-11 MED ORDER — PREDNISONE 20 MG PO TABS
40.0000 mg | ORAL_TABLET | Freq: Every day | ORAL | Status: DC
  Administered 2015-07-11 – 2015-07-15 (×5): 40 mg via ORAL
  Filled 2015-07-11 (×5): qty 2

## 2015-07-11 MED ORDER — POTASSIUM CHLORIDE CRYS ER 20 MEQ PO TBCR
20.0000 meq | EXTENDED_RELEASE_TABLET | Freq: Once | ORAL | Status: AC
  Administered 2015-07-11: 20 meq via ORAL
  Filled 2015-07-11: qty 1

## 2015-07-11 NOTE — Progress Notes (Signed)
Family Medicine Teaching Service Daily Progress Note Intern Pager: [phone]  Patient name: Makayla Vasquez Medical record number: [id number] Date of birth: December 30, 1980 Age: 34 y.o. Gender: female  Primary Care Provider: Nobie Putnam, DO Consultants: HF Code Status: FULL  Pt Overview and Major Events to Date:  9/18: admitted for hypotension likely secondary to adrenal suppression from chronic steroid use with recent decrease in Prednisone dose. Started on Decaron until completion of ACTH Stimulation test 9/19: Transitioned to Hydrocortisone, with improvement of BPs  Assessment and Plan: RAILYNN BALLO is a 34 y.o. female presenting with SOB, weakness, fatigue for 2 weeks. Admitted for hypotension likely secondary to adrenal suppression from chronic steroid use with recent decrease in Prednisone dose. 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 S/p 1L bolus x 3 in ED. ACTH stimulation test positive: base 7.4, 30 min 13.6, 45mn 15.9. BPs improved up to 102-156/41-92.  - Admited to stepdown unit - IV Hydrocortisone 571mq 8 hrs--> transitioned to PO Prednisone 4026maily today  Per pharmacy, will monitor for symptoms on current dose, then will taper starting with 30-74m105mr 1 week, then 20mg32m 1 week, then 10mg 74m1 week, then 5-10 mg depending on symptoms - Blood cultures: NGTD (1 day) - Will monitor blood pressures closely.   Shortness of breath: Likely an exacerbation of underlying chronic interstitial lung disease. On 4L O2 at home. Symptoms worsened after Prednisone dose was decreased to 5mg qd68mcute infectious process is less likely because of a normal WBC count and Pt has been afebrile.  CXR shows chronically  increased interstitial markings. CTA chest shows severe bilaterally symmetric ground-glass consolidation, chronic in nature. Lungs sound clear on exam. Not requiring more than her baseline O2. RR: 14-35 - Given Levaquin and then Vanc/Zosyn in the ED. Will not continue abx at this time, because we do not think that she has a pneumonia. - Continue home Albuterol and Symbicort - Continue 4L O2 by nasal cannula. Titrate to keep O2 sats >90%. - Continuous pulse oximetry  Abdominal Pain with diarrhea: 1-2 week history of crampy epigastric abdominal pain associated with watery non-bloody diarrhea. Hx of IBS but symptoms not similar to this per patient. Hx of heartburn. S/p cholecystectomy. LFT: alk phos nml, mild elevation of AST 70, ALT 58 although improving from admission. No diarrhea since admission. Lipase wnl. Currently not concerning for infectious process.  - continue Protonix 40mg da36m - will monitor  Generalized LE Weakness: Per patient this is chronic in nature, Neuro exam was unremarkable today - PT/OT consult: rec outpatient PT with 3 in 1   Lupus: Recent diagnosis in 05/2015 - Will hold Prednisone 5mg qd  71montinue home CellCept for polyarthralgias  - Continue home med: Percocet 5-325mg q6hr2mr severe pain  Diastolic Congestive Heart Failure: Last ECHO (01/2015): EF 45-50%, diffuse hypokinesis, grade 1 diastolic dysfunction, mild mitral regurgitation, PA peak pressure 34 mmHg. Cardiologist is Dr. Bensimhon.Haroldine Lawsd overloaded to exam. Received 1L NS bolus x 3 in the ED. BNP in the ED was 34-> 177.4. Repeat ECHO: EF 50%, G2DD, mild MR/TR, mild elevation of pulmonary pressure, small pericardial effusion. Urine output over 24 hrs 3.5L with net -2.4L. Weight today 187 (from 188); admin wt 180.  - will follow up on Heart  Failure reccs, appreciate reccs: started IV Lasix 57m BID 9/20 and will transition to PO 460mdaily tomorrow - started Bisoprolol 2.60m71maily (9/21) - started  Spironolactone 260m4mily (9/21) - started Losartan 12.5 BID 9/21 and will increase to home dose 9/22 per HF   Congenital complete AV block, with biventricular pacemaker: Tachycardic up to 130s in the ED, improved with 1L NS boluses x 3. HRs now in the 80s.  - Cardiac monitoring - Vitals per unit routine - per HF will interrogate pacemaker  T2DM, steroid-induced: Last HgbA1c 8.5 (05/04/2015). Home regimen: Metformin 1000mg62m, Lantus 35-50 units qhs. Glucose was 117 in the ED. CBGs 131-240, with AM CBG 137. Required 10 units Novolog over 24 hours - Lantus 20 units qhs - Moderate SSI - CBGs before meals and at bedtime  HLD - Continue home Lipitor 40mg 39mtory of HTN - Holding home Bisprolol, Cozaar, Spironolactone, Lasix in the setting of hypotension.  Asymptomatic Bacteriuria: UA showed few bacteria, large Hgb, 15 ketones, trace leukocytes, positive nitrites, 0-2 WBC. Patient is asymptomatic. Urine culture collected on admission and is pending - no abx tx indicated  FEN/GI: Heart healthy carb-modified diet, 1/2 MIVFs: NS @ 60ml/h67m the setting of CHF Prophylaxis: Lovenox  [facility]: [facility] to med-surg today  Subjective:  Patient doing well this morning. No concerns. No sob, chest pain. Abdominal pain has improved slightly; this is intermittent. Has not had a BM since admission. She states she usually has a BM 2-3x a week unless she has diarrhea. Does not feel constipated; would like to hold off on bowel regimen for now.   Objective: Temp:  [97.7 F (36.5 C)-98.2 F (36.8 C)] 97.8 F (36.6 C) (09/21 0413) Pulse Rate:  [59-102] 60 (09/21 0413) Resp:  [14-36] 22 (09/21 0413) BP: (102-132)/(40-92) 108/62 mmHg (09/21 0413) SpO2:  [90 %-100 %] 99 % (09/21 0413) Weight:  [187 lb 11.2 oz (85.14 kg)-188 lb 15 oz (85.7 kg)] 187 lb 11.2 oz (85.14 kg) (09/21 0413) Physical Exam: General: NAD resting Cardiovascular: RRR, no m/r/g, no  JVD or LE edema Respiratory: CTAB, no crackles on exam today; no wheezing Abdomen: + BS, nondistended, teneder to palpation  Extremities: no LE edema   Laboratory:  Recent Labs Lab 07/08/15 0155 07/09/15 0215  WBC 10.1 6.5  HGB 12.7 10.2*  HCT 39.3 33.0*  PLT 418* 331    Recent Labs Lab 07/08/15 1329 07/09/15 0215 07/10/15 0832 07/11/15 0237  NA 136 136 142 136  K 4.3 4.1 3.8 3.6  CL 107 107 112* 103  CO2 23 22 23 26   BUN 9 10 13 14   CREATININE 0.69 0.63 0.70 0.81  CALCIUM 7.8* 7.8* 8.7* 8.0*  PROT 5.9* 6.0*  --   --   BILITOT 0.8 0.6  --   --   ALKPHOS 48 48  --   --   ALT 65* 58*  --   --   AST 97* 70*  --   --   GLUCOSE 147* 182* 172* 240*    KanishkSmiley Houseman21/2016, 6:54 AM PGY-1, [facility] pager: [phone] welcome

## 2015-07-11 NOTE — Progress Notes (Signed)
Inpatient Diabetes Program Recommendations  AACE/ADA: New Consensus Statement on Inpatient Glycemic Control (2015)  Target Ranges:  Prepandial:   less than 140 mg/dL      Peak postprandial:   less than 180 mg/dL (1-2 hours)      Critically ill patients:  140 - 180 mg/dL   Results for SHAN, PADGETT (MRN [id number]) as of 07/11/2015 13:53  Ref. Range 07/10/2015 07:54 07/10/2015 11:55 07/10/2015 16:30 07/11/2015 08:08 07/11/2015 12:11  Glucose-Capillary Latest Ref Range: 65-99 mg/dL 174 (H) 081 (H) 448 (H) 137 (H) 258 (H)   Review of Glycemic Control  Diabetes history: DM 2 Outpatient Diabetes medications: Lantus 35-50 units Daily, Metformin 1,000 mg BID Current orders for Inpatient glycemic control: Lantus 20 QHS, Novolog Moderate TID + HS  A1c  8.5% 05/04/15  Inpatient Diabetes Program Recommendations:  Correction (SSI): Patient started on PO prednisone 40 mg Daily this am. Please consider increasing correction to Novolog Resistant TID.     May also have to increase basal insulin if hyperglycemia persists.  Thanks,  Christena Deem RN, MSN, [facility] Inpatient Diabetes Coordinator Team Pager [phone] (8a-5p)

## 2015-07-11 NOTE — Progress Notes (Signed)
Advanced Heart Failure Rounding Note  Primary Physician: Hadassah Pais, DO Primary Cardiologist: Dr Gala Romney  Subjective:    Has no complaints.  Denies SOB, but hasn't been getting up very much.  Does not feel swollen.  Denies CP, lightheadedness, dizziness. Has been weight standing.  Out 2.4 L with 40 mg IV lasix BID yesterday.  Weight down 1 lb, but remains up from admission. SBPs in 100s with resumption of bisoprolol and spiro.   Objective:   Weight Range: 187 lb 11.2 oz (85.14 kg) Body mass index is 31.23 kg/(m^2).   Vital Signs:   Temp:  [97.7 F (36.5 C)-98.2 F (36.8 C)] 97.8 F (36.6 C) (09/21 0413) Pulse Rate:  [59-102] 60 (09/21 0413) Resp:  [14-36] 22 (09/21 0413) BP: (102-132)/(40-92) 108/62 mmHg (09/21 0413) SpO2:  [90 %-100 %] 99 % (09/21 0413) Weight:  [187 lb 11.2 oz (85.14 kg)-188 lb 15 oz (85.7 kg)] 187 lb 11.2 oz (85.14 kg) (09/21 0413) Last BM Date: 07/10/15  Weight change: Filed Weights   07/07/15 2233 07/10/15 1800 07/11/15 0413  Weight: 180 lb 8 oz (81.874 kg) 188 lb 15 oz (85.7 kg) 187 lb 11.2 oz (85.14 kg)    Intake/Output:   Intake/Output Summary (Last 24 hours) at 07/11/15 0708 Last data filed at 07/11/15 0600  Gross per 24 hour  Intake   1061 ml  Output   3500 ml  Net  -2439 ml     Physical Exam: General: Well appearing. No resp difficulty HEENT: 02 via [place] Neck: supple. JVP 7-8 cm. Carotids 2+ bilat; no bruits. No lymphadenopathy or thyromegaly. Cor: PMI nondisplaced. RRR. No rubs, gallops or murmurs  Lungs: CTA with slightly diminished bases Abdomen: soft, NT, ND. No hepatosplenomegaly. No bruits or masses. +BS Extremities: no cyanosis, clubbing, rash, trace ankle edema Neuro: alert & orientedx3, cranial nerves grossly intact. moves all 4 extremities w/o difficulty. Affect flat  Telemetry: A sensed V paced 60s. Scarce 3 beats of NSVT.  Labs: CBC  Recent Labs  07/09/15 0215  WBC 6.5  HGB 10.2*  HCT 33.0*   MCV 84.6  PLT 331   Basic Metabolic Panel  Recent Labs  07/10/15 0832 07/11/15 0237  NA 142 136  K 3.8 3.6  CL 112* 103  CO2 23 26  GLUCOSE 172* 240*  BUN 13 14  CALCIUM 8.7* 8.0*   Liver Function Tests  Recent Labs  07/08/15 1329 07/09/15 0215  AST 97* 70*  ALT 65* 58*  ALKPHOS 48 48  BILITOT 0.8 0.6  PROT 5.9* 6.0*  ALBUMIN 2.0* 2.0*    Recent Labs  07/09/15 1322  LIPASE 23   Cardiac Enzymes No results for input(s): CKTOTAL, CKMB, CKMBINDEX, TROPONINI in the last 72 hours.  BNP: BNP (last 3 results)  Recent Labs  10/24/14 0930 07/08/15 0155 07/09/15 1430  BNP 16.4 34.4 177.4*    ProBNP (last 3 results) No results for input(s): PROBNP in the last 8760 hours.   D-Dimer No results for input(s): DDIMER in the last 72 hours. Hemoglobin A1C No results for input(s): HGBA1C in the last 72 hours. Fasting Lipid Panel No results for input(s): CHOL, HDL, LDLCALC, TRIG, CHOLHDL, LDLDIRECT in the last 72 hours. Thyroid Function Tests No results for input(s): TSH, T4TOTAL, T3FREE, THYROIDAB in the last 72 hours.  Invalid input(s): FREET3  Other results:     Imaging/Studies:  No results found.  Latest Echo  Latest Cath   Medications:     Scheduled Medications: . atorvastatin  40 mg Oral Daily  . bisoprolol  2.5 mg Oral Daily  . calcium-vitamin D  1 tablet Oral Q breakfast  . enoxaparin (LOVENOX) injection  40 mg Subcutaneous Q24H  . furosemide  40 mg Intravenous BID  . insulin aspart  0-15 Units Subcutaneous TID WC  . insulin aspart  0-5 Units Subcutaneous QHS  . insulin glargine  20 Units Subcutaneous Q2200  . mycophenolate  500 mg Oral BID  . pantoprazole  40 mg Oral Daily  . potassium chloride SA  20 mEq Oral Daily  . sodium chloride  3 mL Intravenous Q12H  . 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 The HF team was consulted for optimal management of her HF meds.  1. Hypotension - Improved on stress dosing of hydrocortisone IV - IM believes 2/2 adrenal suppression/early adrenal crisis from chronic prednisone use with recent decrease of steroids. - Adrenal work up per primary team. - Work up for sepsis negative thus far, with only mildly positive UA. - Blood Cxs no growth to date. Urine culture with "multiple species present, suggest recollection" No further fevers 2. Lupus - Diagnosed 05/2015 - Per IM 3. Acute on chronic respiratory failure with diffuse lung infiltrates of unknown etiology - CT several bilateral consolidation, but relatively unchanged from 05/2014 - Stable currently on chronic 02 of 4L via [place] - Received levaquin and then Vanc/Zosyn in ED.  Now off ABX. 4. Chronic systolic HF: 07/09/15 EF 50-55%/ Medtronic device.  - Despite appearing euvolemic on exam, Optivol from 9/19 markedly elevated.  - Weight up from admit and Cr stable.  Can continue Lasix 40 mg IV BID today. - RHC in 10/2013 with low filling pressures and adequate output.  - Repeat echo this admit EF 50-55% (improved from 45-50% in April) - Weight up 7 lbs from admit, but down from previous baseline. - Continue bisoprolol and spiro - Will add back losartan starting at 12.5 mg BID - Has Nexplanon in place RUE for pregnancy prevention. - Continue daily weights and I/Os 5. Congenital AVN block s/p pacer - Stable per EP visit on 06/27/15 6. Diabetes - Per IM  She does not appear volume overloaded on exam, but optivol showed fluid overload.  She is also up 7  lbs from admission. Can continue IV lasix today. She had not been taking lasix at home as she was losing weight and not having symptoms.   Repeat Echo this admit with near normal EF of 50-55%. Will continue to uptitrate HF meds as tolerated.   Length of Stay: 3   Graciella Freer PA-C 07/11/2015, 7:08 AM  Advanced Heart Failure Team Pager [phone] (M-F; 7a - 4p)  Please contact [facility] for night-coverage after hours (4p -7a ) and weekends on [email or web address]  Patient seen with PA, agree with the above note.  1. Acute on chronic respiratory failure: Admitted with dyspnea. 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 She does not appear markedly volume overloaded on exam, but Optivol showed fluid index > threshold with low impedance. She diuresed well  yesterday.   - Will give Lasix 40 mg IV bid today while she's here, transition over to Lasix 40 mg po daily tomorrow.   2. Chronic systolic CHF: Echo this admission with EF 50-55% (improved from the past). Has Medtronic CRT-P system. As above, volume overload by Optivol though exam difficult. BP much improved today. BNP at 177, higher than her prior reading. - With improved BP, begin to start back cardiac meds. Start losartan back today, increase doses to home doses tomorrow. - Lasix 40 mg IV bid today as long as she's here, to Lasix 40 mg po daily tomorrow.  3. Hypotension: Resolved on stress dose steroids. Suspect adrenal insufficiency with decrease in her chronic steroid dose. No evidence for septic shock and do not think cardiogenic.   [name] 07/11/2015 9:20 AM

## 2015-07-11 NOTE — Evaluation (Signed)
Occupational Therapy Evaluation Patient Details Name: Makayla Vasquez MRN: [id number] DOB: 1981-04-12 Today's Date: 07/11/2015    History of Present Illness 34 year old female with significant past medical history including recent diagnosis of SLE, chronic interstitial lung disease, congenital complete atrioventricular block status post biventricular pacemaker placement, congestive heart failure with last echo showing EF 45%, bipolar disorder who presents with 2 week history of fatigue and dyspnea and one day worsening of the symptoms   Clinical Impression   Patient evaluated by Occupational Therapy with no further acute OT needs identified. All education has been completed and the patient has no further questions. Pt is modified independent with ADLs.  Recommend 3in1 for home use.  See below for any follow-up Occupational Therapy or equipment needs. OT is signing off. Thank you for this referral.      Follow Up Recommendations  No OT follow up;Supervision - Intermittent    Equipment Recommendations  3 in 1 bedside comode    Recommendations for Other Services       Precautions / Restrictions Precautions Precautions: None      Mobility Bed Mobility                  Transfers Overall transfer level: Modified independent                    Balance                                            ADL Overall ADL's : Modified independent                                       General ADL Comments: Discussed use of 3in1 over commode and in tub as a tub seat.  Pt verbalized understanding      Vision     Perception     Praxis      Pertinent Vitals/Pain Pain Assessment: No/denies pain     Hand Dominance Right   Extremity/Trunk Assessment Upper Extremity Assessment Upper Extremity Assessment: Generalized weakness   Lower Extremity Assessment Lower Extremity Assessment: Defer to PT evaluation   Cervical / Trunk  Assessment Cervical / Trunk Assessment: Normal   Communication Communication Communication: No difficulties   Cognition Arousal/Alertness: Awake/alert Behavior During Therapy: WFL for tasks assessed/performed Overall Cognitive Status: Within Functional Limits for tasks assessed                     General Comments       Exercises       Shoulder Instructions      Home Living Family/patient expects to be discharged to:: Private residence Living Arrangements: Spouse/significant other Available Help at Discharge: Friend(s);Family;Available PRN/intermittently Type of Home: Apartment Home Access: Stairs to enter Entrance Stairs-Number of Steps: 2 short steps   Home Layout: One level     Bathroom Shower/Tub: Chief Strategy Officer: Standard     Home Equipment: None          Prior Functioning/Environment Level of Independence: Needs assistance    ADL's / Homemaking Assistance Needed: Pt has 4.yo son, 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  She says boyfriend assists with meals and household management, and assists her in moving  sit to stand from tub.  She reports she occasionally has difficulty standing from commode.   She reports she sleeps most of the day until her boyfriend goes to work.    Comments: Uses 4L supplemental 02 at home     OT Diagnosis: Generalized weakness   OT Problem List:     OT Treatment/Interventions:      OT Goals(Current goals can be found in the care plan section) Acute Rehab OT Goals Patient Stated Goal: return home OT Goal Formulation: All assessment and education complete, DC therapy  OT Frequency:     Barriers to D/C:            Co-evaluation              End of Session Equipment Utilized During Treatment: Oxygen Nurse Communication: Mobility status  Activity Tolerance: Patient tolerated treatment well Patient left: in chair;with call bell/phone within reach    Time: 1424-1440 OT Time Calculation (min): 16 min Charges:    G-Codes:    Conarpe, Wendi M August 02, 2015, 2:52 PM

## 2015-07-11 NOTE — Care Management Note (Signed)
Case Management Note  Patient Details  Name: MADYLYN INSCO MRN: [id number] Date of Birth: 1981/04/24  Subjective/Objective:       Adm w hypotension             Action/Plan: lives w fam, indep at home, has home o2 w [facility]   Expected Discharge Date:                  Expected Discharge Plan:  Home/Self Care  In-House Referral:     Discharge planning Services     Post Acute Care Choice:  Durable Medical Equipment Choice offered to:     DME Arranged:  3-N-1 DME Agency:  [facility]  HH Arranged:    HH Agency:     Status of Service:     Medicare Important Message Given:    Date Medicare IM Given:    Medicare IM give by:    Date Additional Medicare IM Given:    Additional Medicare Important Message give by:     If discussed at Long Length of Stay Meetings, dates discussed:    Additional Comments: have alerted jermaine w [facility] that pt has order for 3n1 bsc  Hanley Hays, RN 07/11/2015, 9:50 AM

## 2015-07-11 NOTE — Care Management Important Message (Signed)
Important Message  Patient Details  Name: Makayla Vasquez MRN: [id number] Date of Birth: 12-28-1980   Medicare Important Message Given:  Yes-second notification given    Orson Aloe 07/11/2015, 11:53 AM

## 2015-07-11 NOTE — Discharge Summary (Signed)
[facility] Discharge Summary  Patient name: Makayla Vasquez Medical record number: [id number] Date of birth: 1981-06-05 Age: 34 y.o. Gender: female Date of Admission: 07/08/2015  Date of Discharge: 07/15/15 Admitting Physician: Alveda Reasons, MD  Primary Care Provider: Nobie Putnam, DO Consultants: HF  Indication for Hospitalization:  Hypotension, SOB, weakness  Discharge Diagnoses/Problem List:  Hypotension due to Adrenal Supression/Insufficiency Chronic Diastolic CHF  Disposition: home   Discharge Condition: improved  Brief Hospital Course:  Makayla Vasquez is a 34 y.o. female who presented with shortness of breath, weakness, and fatigue for 2 weeks. She was found to be hypotensive likely secondary to adrenal supression form chronic steroid use with a recent decrease in steroid dose vs infectious process and was subsequently admitted for further evaluation and management. 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  Hypotension Secondary to Adrenal Insufficiency:  Patient presented to ED with blood pressures in 60s-80s/30s-50s but was mentating well. Patient was given fluid boluses in the ED with no improvement of blood pressures, and patient was also given Solucortef x 1. Patient's antihypertensive medications were held. On admission, Patient was started on Decadron until ACTH stimulation test were obtained, and then transitioned to IV Hydrocortisone which improved blood pressure. ACTH stimulation tests were indicative of adrenal insufficiency: base 7.4, 30 min 13.6, 27mn 15.9. Patient was then transitioned to PO prednisone 44mdaily (dose that is equivalent to the IV hydrocortisone dose). Patient's blood pressures mainly remained in 90s-100s/40-50s once on PO Prednisone; patient was asymptomatic at rest and with ambulation.  Patient was discharged on Prednisone 4046maily. Per hospital pharmacy, can taper starting with 30-59m11mr 1 week, then 20mg45m 1 week, then 10mg 82m1 week, then 5-10 mg depending on symptoms.      There was an initial concern for possible infectious etiology for symptoms. Patient was given one dose of Levaquin, Zosyn, and Vancomycin in the ED for possible sepsis.  However, patient was afebrile and had normal WBC count. Additionally CXR was unremarkable for an acute infectious process. UA showed few bacteria, large Hbg, 15 ketones, trace leukocytes, positive nitrites, and 0-2 WBC; urine culture noted "multiple species present". However patient was asymptomatic without suprapubic pain or dysuria. Asymptomatic bacteriuria does not indicate antibiotic therapy. Antibiotic therapy was not continued during hospitalization. Blood cultures showed no growth x 5 days.  Additionally, CTA chest was obtained to rule out PE as patient's d-dimer was elevated; imaging showed severe bilateral symmetric ground-glass consolidation that is chronic in nature with no evidence of PE.   Shortness of Breath: Symptom was thought to be an exacerbation of underlying chronic interstitial lung disease likely worsened after a decrease in steroid dose as an outpatient. Patient maintained good saturations on her home 4L O2 via nasal canula. Home albuterol and symbicort were continued. PT/OT were consulted and recommended Outpatient PT and 3-in-1 which were both ordered at discharge.   Diastolic Congestive Heart Failure:  On admission, patient was not clinically fluid overloaded. Her home lasix was initially held in the setting of hypotension. Patient's initial BNP was 34. Last ECHO (01/2015): EF 45-50%, diffuse hypokinesis, grade 1 diastolic dysfunction, mild mitral regurgitation, PA peak pressure 34 mmHg. Heart Failure was consulted for recommendations in the setting of high dose steroids. Repeat ECHO showed EF 50%, G2DD, mild MR/TR,  mild elevation of pulmonary pressure, small pericardial effusion. Repeat BNP was 177. Although  patient was euvolemic on exam, Optivol was markedly elevated; therefore per Heart Failure team patient was given IV Lasix 34m BID for two days with good urine output. Patient was then started on PO Lasix 223mdaily by HF team. Patient has outpatient follow up with HF.   Hypokalemia:  Patient was continued on home Kdur 2033mdaily. Her Potassium decreased to 3.2 during hospitalization; Kdur was given to repleat. She was discharged on Kdur 66m56maily.   T2DM, steroid induced: Patinet's lst HgbA1c was 8.5 (04/2015). Patient's home regimen included Metformin 1000mg37m, Lantus 35-50 units qhs. Patient was initially started on Lantus 20units with moderate ISS. She was discharged with prescription for Lantus 20units at bedtime and was instructed to resume Metformin upon discharge.   Sinusitis:  Patient was started on Augmentin and given prescription upon discharge for a total 7 day course of antibiotics. Patient also received 1 dose Fluconzaole as she stated she gets yeast infections when taking antibiotics.  History of HTN: Home Bisoprolol, Cozaar, and Spironolactone were initially held in the setting of CHF. As patient's blood pressures improved, Bisoprolol and Cozaar were restarted. Spironolactone was not continued at discharge per HF.    Abdominal Pain and Hx of Diarrhea: Patient noted of 1-2 week history of intermittent crampy epigastric abdominal pain associated with watery non-bloody diarrhea. Patient has history of IBS but symptoms not similar to this per patient. Hx of heartburn. LFT: alk phos nml, mild elevation of AST 70, ALT 58 although improving from admission. Lipase wnl. At home has BM x3-4 days unless having diarrhea. Her symptoms were not concerning for infectious process. Patient was started on Protonix 40mg 49my. She did not have episodes of diarrhea during hospital admission.    Lupus: Patient was recently diagnosed with lupus in 05/2015. Patient was continued on CellCept for polyarthralgias and Percocet 5-325mg q70mours PRN for severe pain.   HLD: Patient was continued on home Lipitor 40mg  A79mtomatic Bacteriuria:  On admission UA showed few bacteria, large Hgb, 15 ketones, trace leukocytes, positive nitrites, 0-2 WBC. Urine culture collected showed multiple species present, suggest recollection. Since patient was asymptomatic, repeat urine culture was not collected, and antibiotic was not indicated.   Issues for Follow Up:  - f/u on symptoms and blood pressure. Per hospital pharmacy, can taper starting with 30-35mg for35meek, then 66mg for 24mek, then 10mg for 19mk, then 5-10 mg depending on symptoms. Per HF, would avoid giving Fluorinef due to history of CHF.  - f/u on glucose levels. Patient states she had not need to take insulin at home (although Lantus was on her home medication list). Patient required insulin regimen while hospitalized especially with steroid therapy. Patinet was discharged on Latus 20 units at bedtime. Per case management, patient was instructed to purchase glucometer and strips from [employer] and then set up to get glucometer/strips through insurance by PCP.  - f/u sinusitis if symptoms improving - consider repeat BMP to monitor Potassium. Patient may need to be on a hight dose a daily Kdur. - f/u on chronic intermittent abdominal pain and diarrhea. Patient was started on Protonix.  - ensure patient will f/u with HF clinic    Significant Procedures: none  Significant Labs and Imaging:   Recent Labs Lab 07/09/15 0215 07/15/15 0606  WBC 6.5 9.3  HGB 10.2* 10.4*  HCT 33.0* 34.1*  PLT 331 319    Recent Labs Lab 07/08/15 1329 07/09/15 0215  07/11/15 0237 07/12/15 0250 07/13/15 0605 07/13/2335  1100 07/14/15 0612 07/15/15 0606  NA 136 136  < > 136 139 142  --  139 134*  K 4.3 4.1  < > 3.6 3.4* 3.2*  --  3.7 4.4  CL 107 107   < > 103 100* 105  --  104 98*  CO2 23 22  < > 26 32 32  --  29 31  GLUCOSE 147* 182*  < > 240* 212* 155*  --  226* 154*  BUN 9 10  < > 14 13 12   --  10 15  CREATININE 0.69 0.63  < > 0.81 0.69 0.71  --  0.70 0.74  CALCIUM 7.8* 7.8*  < > 8.0* 8.2* 8.1*  --  8.2* 8.4*  MG  --   --   --   --   --   --  1.7  --   --   ALKPHOS 48 48  --   --   --   --   --   --   --   AST 97* 70*  --   --   --   --   --   --   --   ALT 65* 58*  --   --   --   --   --   --   --   ALBUMIN 2.0* 2.0*  --   --   --   --   --   --   --   < > = values in this interval not displayed. CXR: chronicly increased interstitial markings CTA chest: shows severe bilaterally symmetric ground-glass consolidation, chronic in nature.  ECHO: EF 50%, G2DD, mild MR/TR, mild elevation of pulmonary pressure, small pericardial effusion.   Results/Tests Pending at Time of Discharge: none  Discharge Medications:    Medication List    TAKE these medications        acetaminophen 500 MG tablet  Commonly known as:  TYLENOL  Take 1 tablet (500 mg total) by mouth 2 (two) times daily.     albuterol 108 (90 BASE) MCG/ACT inhaler  Commonly known as:  PROVENTIL HFA;VENTOLIN HFA  Inhale 2 puffs into the lungs every 4 (four) hours as needed for wheezing or shortness of breath.     amoxicillin-clavulanate 875-125 MG per tablet  Commonly known as:  AUGMENTIN  Take 1 tablet by mouth every 12 (twelve) hours.     atorvastatin 40 MG tablet  Commonly known as:  LIPITOR  Take 1 tablet (40 mg total) by mouth daily.     bisoprolol 5 MG tablet  Commonly known as:  ZEBETA  Take 0.5 tablets (2.5 mg total) by mouth daily.     budesonide-formoterol 80-4.5 MCG/ACT inhaler  Commonly known as:  SYMBICORT  Inhale 2 puffs into the lungs 2 (two) times daily as needed (for shortness of breath).     calcium-vitamin D 500-200 MG-UNIT per tablet  Commonly known as:  OSCAL WITH D  Take 1 tablet by mouth.     furosemide 20 MG tablet  Commonly known as:   LASIX  Take 1 tablet (20 mg total) by mouth daily.     GLUCERNA ADVANCE SHAKE Liqd  Take supplement daily in place of 1 meal.     glucose blood test strip  Commonly known as:  ONE TOUCH ULTRA TEST  Check blood sugar daily before breakfast     Insulin Glargine 100 UNIT/ML Solostar Pen  Commonly known as:  LANTUS SOLOSTAR  Inject 20 Units into  the skin daily at 10 pm.     Insulin Pen Needle 32G X 4 MM Misc  Commonly known as:  INSUPEN PEN NEEDLES  Use with insulin pen     losartan 25 MG tablet  Commonly known as:  COZAAR  Take 0.5 tablets (12.5 mg total) by mouth daily.     metFORMIN 1000 MG tablet  Commonly known as:  GLUCOPHAGE  TAKE 1 TABLET BY MOUTH TWICE A DAY WITH A MEAL     mycophenolate 500 MG tablet  Commonly known as:  CELLCEPT  Take 500 mg by mouth 2 (two) times daily.     NEXPLANON 68 MG Impl implant  Generic drug:  etonogestrel  1 each by Subdermal route once.     ONE TOUCH ULTRA 2 W/DEVICE Kit  Check blood sugar daily before breakfast     oxyCODONE-acetaminophen 5-325 MG per tablet  Commonly known as:  PERCOCET  Take 1 tablet by mouth every 6 (six) hours as needed for severe pain.     pantoprazole 40 MG tablet  Commonly known as:  PROTONIX  Take 1 tablet (40 mg total) by mouth daily.     potassium chloride SA 20 MEQ tablet  Commonly known as:  KLOR-CON M20  Take 1 tablet (20 mEq total) by mouth daily.  Start taking on:  07/16/2015     predniSONE 20 MG tablet  Commonly known as:  DELTASONE  Take 2 tablets (40 mg total) by mouth daily with breakfast.        Discharge Instructions: Please refer to Patient Instructions section of EMR for full details.  Patient was counseled important signs and symptoms that should prompt return to medical care, changes in medications, dietary instructions, activity restrictions, and follow up appointments.   Follow-Up Appointments: Follow-up Information    Follow up with [facility].   Why:  Will  deliver 3in1 to room prior to discharge.   Contact information:   [address] [place] [address] [phone]       Follow up with [facility].   Specialty:  Rehabilitation   Why:  will call within 72 hours of discharge to set up appointment, if you do not hear from them, please call to schedule your appointment   Contact information:   [address] [place] [phone]      Follow up with [facility] On 07/24/2015.   Specialty:  Cardiology   Why:  at 1020 am with Darrick Grinder, NP-C for post hospital follow up. Please bring all your medications.  Call the office for the patient parking code.   Contact information:   [address] [id number] [place] [phone]      Follow up with Nobie Putnam, DO On 07/23/2015.   Specialty:  Osteopathic Medicine   Why:  at 9:45am; for hospital follow up visit   Contact information:   [place] [address] [phone]       Smiley Houseman, MD 07/15/2015, 12:57 PM PGY-1, [place]

## 2015-07-12 LAB — BASIC METABOLIC PANEL
Anion gap: 7 (ref 5–15)
BUN: 13 mg/dL (ref 6–20)
CHLORIDE: 100 mmol/L — AB (ref 101–111)
CO2: 32 mmol/L (ref 22–32)
Calcium: 8.2 mg/dL — ABNORMAL LOW (ref 8.9–10.3)
Creatinine, Ser: 0.69 mg/dL (ref 0.44–1.00)
GFR calc non Af Amer: >60 mL/min (ref >60)
Glucose, Bld: 212 mg/dL — ABNORMAL HIGH (ref 65–99)
POTASSIUM: 3.4 mmol/L — AB (ref 3.5–5.1)
SODIUM: 139 mmol/L (ref 135–145)

## 2015-07-12 LAB — GLUCOSE, CAPILLARY
GLUCOSE-CAPILLARY: 97 mg/dL (ref 65–99)
GLUCOSE-CAPILLARY: 280 mg/dL — AB (ref 65–99)
GLUCOSE-CAPILLARY: 206 mg/dL — AB (ref 65–99)
Glucose-Capillary: 127 mg/dL — ABNORMAL HIGH (ref 65–99)

## 2015-07-12 MED ORDER — POTASSIUM CHLORIDE CRYS ER 20 MEQ PO TBCR
20.0000 meq | EXTENDED_RELEASE_TABLET | Freq: Once | ORAL | Status: AC
  Administered 2015-07-12: 20 meq via ORAL
  Filled 2015-07-12: qty 1

## 2015-07-12 MED ORDER — FUROSEMIDE 40 MG PO TABS: 40.0000 mg | ORAL_TABLET | Freq: Every day | ORAL | Status: DC

## 2015-07-12 NOTE — Progress Notes (Signed)
Family Medicine Teaching Service Daily Progress Note Intern Pager: [phone]  Patient name: Makayla Vasquez Medical record number: [id number] Date of birth: 07/17/1981 Age: 34 y.o. Gender: female  Primary Care Provider: Nobie Putnam, DO Consultants: HF Code Status: FULL  Pt Overview and Major Events to Date:  9/18: admitted for hypotension likely secondary to adrenal suppression from chronic steroid use with recent decrease in Prednisone dose. Started on Decaron until completion of ACTH Stimulation test 9/19: Transitioned to Hydrocortisone, with improvement of BPs 9/21: transitioned to PO prednisone; BP stable  Assessment and Plan: Makayla Vasquez is a 34 y.o. female presenting with SOB, weakness, fatigue for 2 weeks. Admitted for hypotension likely secondary to adrenal suppression from chronic steroid use with recent decrease in Prednisone dose. 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 S/p 1L bolus x 3 in ED. ACTH stimulation test positive: base 7.4, 30 min 13.6, 42min 15.9. BPs soft overnight: 92-110/43-61.  - transferred to med-surg (9/21) - PO Prednisone $RemoveBefor'40mg'TJMTDfYNGWdV$  daily (started 9/21) Per pharmacy, will monitor for symptoms on current dose, then will taper starting with 30-$RemoveBeforeDE'35mg'BgXTopSNYEByoOe$  for 1 week, then $RemoveBe'20mg'UDzJOHxhu$  for 1 week, then $RemoveBe'10mg'gNHyplXIb$  for 1 week, then 5-10 mg depending on symptoms; may need to add Flurinef for mineralocorticoid - Will monitor blood pressures closely.  - Blood cultures: NGTD   Shortness of breath: Likely an exacerbation of underlying chronic interstitial lung disease. On 4L O2 at home. Symptoms worsened after Prednisone dose was decreased to $RemoveBefo'5mg'vUmrXMPvwjo$  qd. Acute infectious process is less likely because of a  normal WBC count and Pt has been afebrile. CXR shows chronically increased interstitial markings. CTA chest shows severe bilaterally symmetric ground-glass consolidation, chronic in nature. Lungs sound clear on exam. Not requiring more than her baseline O2. RR: 18-29 - Given Levaquin and then Vanc/Zosyn in the ED. Will not continue abx at this time, because we do not think that she has a pneumonia. - Continue home Albuterol and Symbicort - Continue 4L O2 by nasal cannula. Titrate to keep O2 sats >90%. - Continuous pulse oximetry  Abdominal Pain with diarrhea: 1-2 week history of crampy epigastric abdominal pain associated with watery non-bloody diarrhea. Hx of IBS but symptoms not similar to this per patient. Hx of heartburn. S/p cholecystectomy. LFT: alk phos nml, mild elevation of AST 70, ALT 58 although improving from admission. Lipase wnl. At home has BM x3-4 days unless having diarrhea. Currently not concerning for infectious process. Had BM overnight: soft.  - continue Protonix $RemoveBeforeDEI'40mg'LvViLRxPWLgEoUAk$  daily  - will monitor  Generalized LE Weakness: Per patient this is chronic in nature, Neuro exam was unremarkable  - PT/OT consult: rec outpatient PT with 3 in 1   Diastolic Congestive Heart Failure: Last ECHO (01/2015): EF 45-50%, diffuse hypokinesis, grade 1 diastolic dysfunction, mild mitral regurgitation, PA peak pressure 34 mmHg. Cardiologist is Dr. Haroldine Laws. Not fluid overloaded to exam. Received 1L NS bolus x 3 in the ED. BNP in the ED was 34-> 177.4. Repeat ECHO: EF 50%, G2DD, mild MR/TR, mild elevation of pulmonary pressure, small pericardial effusion. Urine output over 24 hrs ~4.2L with net -5.68L. Weight today 185 (from 187); admin wt 180 (however recent outpatient wt 187 up to low 190s).  - will follow up on Heart Failure reccs, appreciate reccs: HOLD antihypertensivs and lasix today  for low BP per HF recs - on Kdur 37mEq daily   Hypokalemia: 3.4 9/22, likely due to IV Lasix  - on Kdur 45mEq  daily  - will give extra  Kdur 71mEq once  - On spironolactone which will help keep K up   Congenital complete AV block, with biventricular pacemaker: Tachycardic up to 130s in the ED, improved with 1L NS boluses x 3. HRs now in the 80s.  - Cardiac monitoring - Vitals per unit routine - per HF will interrogate pacemaker  T2DM, steroid-induced: Last HgbA1c 8.5 (05/04/2015). Home regimen: Metformin 1000mg  bid, Lantus 35-50 units qhs. Glucose was 117 in the ED. CBGs 137-258, with AM CBG  97. Required 15 units Novolog over 24 hours - Lantus 20 units qhs - Moderate SSI - CBGs before meals and at bedtime  Lupus: Recent diagnosis in 05/2015 - Will hold Prednisone 5mg  qd  - Continue home CellCept for polyarthralgias  - Continue home med: Percocet 5-325mg  q6hrs for severe pain  HLD - Continue home Lipitor 40mg   History of HTN - now back on Bisoprolol, Spironolactone.  - plan to slowly increase Losartan up to home dose  Asymptomatic Bacteriuria: UA showed few bacteria, large Hgb, 15 ketones, trace leukocytes, positive nitrites, 0-2 WBC. Patient is asymptomatic. Urine culture collected on admission and is pending - no abx tx indicated  FEN/GI: Heart healthy carb-modified diet, 1/2 MIVFs: NS @ 20ml/hr in the setting of CHF Prophylaxis: Lovenox  Disposition: Admit to [facility] Medicine Teaching Service until blood pressures are stable on PO steroids  Subjective:  Patient states she is doing well this morning. Early this morning, patient was noted to have SBP in 70s while sleeping, with repeat BP 81/42 while awake. Patient was asymptomatic. Patient was given her prednisone dose early this morning. Transfer to med-surg was held until BP improved. Repeat BP 93/53.   Objective: Temp:  [97.6 F (36.4 C)-98.2 F (36.8 C)] 98.2 F (36.8 C) (09/22 0400) Pulse Rate:  [58-60] 59 (09/22 0400) Resp:  [18-29] 20 (09/22 0400) BP: (92-156)/(43-84) 96/43 mmHg (09/22 0400) SpO2:  [97 %-100 %] 99 %  (09/22 0400) Physical Exam: General: NAD sitting in chair and eating Cardiovascular: RRR, no m/r/g, no JVD or LE edema Respiratory: CTAB, no crackles on exam today; no wheezing Abdomen: + BS, nondistended, nontender to palpation  Extremities: no LE edema   Laboratory:  Recent Labs Lab 07/08/15 0155 07/09/15 0215  WBC 10.1 6.5  HGB 12.7 10.2*  HCT 39.3 33.0*  PLT 418* 331    Recent Labs Lab 07/08/15 1329 07/09/15 0215 07/10/15 0832 07/11/15 0237 07/12/15 0250  NA 136 136 142 136 139  K 4.3 4.1 3.8 3.6 3.4*  CL 107 107 112* 103 100*  CO2 23 22 23 26  32  BUN 9 10 13 14 13   CREATININE 0.69 0.63 0.70 0.81 0.69  CALCIUM 7.8* 7.8* 8.7* 8.0* 8.2*  PROT 5.9* 6.0*  --   --   --   BILITOT 0.8 0.6  --   --   --   ALKPHOS 48 48  --   --   --   ALT 65* 58*  --   --   --   AST 97* 70*  --   --   --   GLUCOSE 147* 182* 172* 240* 212*    Imaging/Diagnostic Tests: CXR: chronic interstitial lung markings CTA chest: negative for PE  Smiley Houseman, MD 07/12/2015, 6:30 AM PGY-1, [place] Intern pager:  [phone], text pages welcome

## 2015-07-12 NOTE — Progress Notes (Signed)
Inpatient Diabetes Program Recommendations  AACE/ADA: New Consensus Statement on Inpatient Glycemic Control (2015)  Target Ranges:  Prepandial:   less than 140 mg/dL      Peak postprandial:   less than 180 mg/dL (1-2 hours)      Critically ill patients:  140 - 180 mg/dL    Results for INELL, MIMBS (MRN [id number]) as of 07/12/2015 13:00  Ref. Range 07/11/2015 08:08 07/11/2015 12:11 07/11/2015 16:58 07/11/2015 21:42  Glucose-Capillary Latest Ref Range: 65-99 mg/dL 675 (H) 916 (H) 384 (H) 232 (H)    Results for XITLALLI, NEWHARD (MRN [id number]) as of 07/12/2015 13:00  Ref. Range 07/12/2015 08:02 07/12/2015 11:41  Glucose-Capillary Latest Ref Range: 65-99 mg/dL 97 177 (H)    Home DM Meds: Lantus 35-50 units daily       Metformin 1000 mg bid  Current DM Orders: Lantus 20 units QHS            Novolog Moderate SSI (0-15 units) TID AC + HS      -Patient eating 100% of meals.  Having elevated postprandial glucose readings.  -Prednisone likely leading to elevated glucose levels.    MD- Please consider adding Novolog Meal Coverage to current in-hospital insulin regimen-  Novolog 4 units tid with meals    Will follow Ambrose Finland RN, MSN, CDE Diabetes Coordinator Inpatient Glycemic Control Team Team Pager: [phone] (8a-5p)

## 2015-07-12 NOTE — Progress Notes (Signed)
Advanced Heart Failure Rounding Note  Primary Physician: Hadassah Pais, DO Primary Cardiologist: Dr Gala Romney  Subjective:    Feels fine today. Denies dizziness or lightheadedness with low BPs. Denies SOB or CP.  Out 3.3 L with 40 mg IV lasix BID yesterday.  Weight down 2 lb, still up from admission. SBPs in 100s with resumption of bisoprolol and spiro.   Objective:   Weight Range: 185 lb 10 oz (84.2 kg) Body mass index is 30.89 kg/(m^2).   Vital Signs:   Temp:  [97.6 F (36.4 C)-98.2 F (36.8 C)] 98.2 F (36.8 C) (09/22 0400) Pulse Rate:  [58-64] 64 (09/22 0722) Resp:  [18-29] 26 (09/22 0722) BP: (81-156)/(42-84) 81/42 mmHg (09/22 0722) SpO2:  [97 %-100 %] 98 % (09/22 0722) Weight:  [185 lb 10 oz (84.2 kg)] 185 lb 10 oz (84.2 kg) (09/22 0632) Last BM Date: 07/11/15  Weight change: Filed Weights   07/10/15 1800 07/11/15 0413 07/12/15 6629  Weight: 188 lb 15 oz (85.7 kg) 187 lb 11.2 oz (85.14 kg) 185 lb 10 oz (84.2 kg)    Intake/Output:   Intake/Output Summary (Last 24 hours) at 07/12/15 0731 Last data filed at 07/12/15 0258  Gross per 24 hour  Intake    919 ml  Output   4250 ml  Net  -3331 ml     Physical Exam: General: Well appearing. No resp difficulty HEENT: 02 via [place] Neck: supple. JVP 7-8 cm. Carotids 2+ bilat; no bruits. No lymphadenopathy or thyromegaly noted. Cor: PMI nondisplaced. RRR. No rubs, gallops or murmurs appreciated Lungs: CTA with diminished bases Abdomen: soft, nontender, nondistended. No hepatosplenomegaly. No bruits or masses. +BS Extremities: no cyanosis, clubbing, rash. Trace ankle edema Neuro: alert & orientedx3, cranial nerves grossly intact. moves all 4 extremities w/o difficulty. Affect flat  Telemetry: A sensed V paced 60s.  Labs: CBC No results for input(s): WBC, NEUTROABS, HGB, HCT, MCV, PLT in the last 72 hours. Basic Metabolic Panel  Recent Labs  07/11/15 0237 07/12/15 0250  NA 136 139  K 3.6 3.4*  CL  103 100*  CO2 26 32  GLUCOSE 240* 212*  BUN 14 13  CALCIUM 8.0* 8.2*   Liver Function Tests No results for input(s): AST, ALT, ALKPHOS, BILITOT, PROT, ALBUMIN in the last 72 hours.  Recent Labs  07/09/15 1322  LIPASE 23   Cardiac Enzymes No results for input(s): CKTOTAL, CKMB, CKMBINDEX, TROPONINI in the last 72 hours.  BNP: BNP (last 3 results)  Recent Labs  10/24/14 0930 07/08/15 0155 07/09/15 1430  BNP 16.4 34.4 177.4*    ProBNP (last 3 results) No results for input(s): PROBNP in the last 8760 hours.   D-Dimer No results for input(s): DDIMER in the last 72 hours. Hemoglobin A1C No results for input(s): HGBA1C in the last 72 hours. Fasting Lipid Panel No results for input(s): CHOL, HDL, LDLCALC, TRIG, CHOLHDL, LDLDIRECT in the last 72 hours. Thyroid Function Tests No results for input(s): TSH, T4TOTAL, T3FREE, THYROIDAB in the last 72 hours.  Invalid input(s): FREET3  Other results:     Imaging/Studies:  No results found.  Latest Echo  Latest Cath   Medications:     Scheduled Medications: . atorvastatin  40 mg Oral Daily  . bisoprolol  2.5 mg Oral Daily  . calcium-vitamin D  1 tablet Oral Q breakfast  . enoxaparin (LOVENOX) injection  40 mg Subcutaneous Q24H  . furosemide  40 mg Oral Daily  . insulin aspart  0-15 Units Subcutaneous TID  WC  . insulin aspart  0-5 Units Subcutaneous QHS  . insulin glargine  20 Units Subcutaneous Q2200  . losartan  12.5 mg Oral BID  . mycophenolate  500 mg Oral BID  . pantoprazole  40 mg Oral Daily  . potassium chloride SA  20 mEq Oral Daily  . predniSONE  40 mg Oral Q breakfast  . sodium chloride  3 mL Intravenous Q12H  . 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 The HF team was consulted for optimal management of her HF meds.  1. Hypotension - Improved with stress dosing of hydrocortisone IV. Now continuing on prednisone. - IM believes 2/2 adrenal suppression/early adrenal crisis from chronic prednisone use with recent decrease of steroids.  - Adrenal work up per primary team. - Work up for sepsis negative thus far, with only mildly positive UA. - Blood Cxs no growth to date. Urine culture with "multiple species present, suggest recollection" No further fevers 2. Lupus - Diagnosed 05/2015 - Per IM 3. Acute on chronic respiratory failure with diffuse lung infiltrates of unknown etiology - CT several bilateral consolidation, but relatively unchanged from 05/2014 - Stable currently on chronic 02 of 4L via [place] - Received levaquin and then Vanc/Zosyn in ED.  Now off ABX. 4. Chronic systolic HF: 07/09/15 EF 50-55%/ Medtronic device.  - Despite appearing euvolemic on exam, Optivol from 9/19 markedly elevated.  - Weight up from admit and Cr stable.  Will transition to 40 mg po lasix for chronic dose. - RHC in 10/2013 with low filling pressures and adequate output.  - Repeat echo this admit EF 50-55% (improved from 45-50% in April) - Weight up 5 lbs from admit, but down from previous baseline. - Hold losartan, Lasix, and spironolactone today with low BP.  - Has Nexplanon in place RUE for pregnancy prevention. - Continue daily weights and I/Os 5. Congenital AVN block s/p pacer - Stable per EP visit on 06/27/15 6. Diabetes - Per IM  Will need follow up in HF clinic.  Length of Stay: 4   Graciella Freer PA-C 07/12/2015, 7:31 AM  Advanced Heart Failure Team Pager [phone] (M-F; 7a - 4p)  Please contact [facility] for night-coverage after hours (4p -7a ) and weekends on [email or web address]  Patient seen with PA, agree with the above note.  BP low again today after coming off IV hydrocortisone.  Will need to stop losartan, Lasix, and spironolactone today.  Would continue low dose bisoprolol if possible.  Restart po Lasix when BP more stable.  Steroid management per primary service.  Would try to avoid Florinef given heart failure.   [name] 07/12/2015 9:38 AM

## 2015-07-12 NOTE — Progress Notes (Signed)
Pt Orthostatic vitals collected at 1142 di not  [place] over in computer. recharted now. 07/12/2015 1:45 PM Makayla Vasquez

## 2015-07-12 NOTE — Progress Notes (Signed)
Pt orthostatic vitals collected at 1142,did not cross over from Vitals to ortho order. recharted now. 07/12/2015 1:47 PM [name],[name]

## 2015-07-12 NOTE — Care Management Note (Addendum)
Case Management Note  Patient Details  Name: Makayla Vasquez MRN: [id number] Date of Birth: [birth year]  Subjective/Objective:            Date:07-12-15 Thursday Spoke with patient at the bedside. Introduced self as Sports coach and explained role in discharge planning and how to be reached. Verified patient lives in [place] in apartment on first floor, with spouse. Verified patient anticipates to go home with family,at time of discharge and will have  part-time supervision by family  at this time to best of their knowledge. Patient has DME oxygen through [facility]. Expressed potential need for 3in1, ordered through [facility], will be delivered to room prior to discharge. Patient  confirmed  needing help with their medication because she "doesn't get a check anymore."  Patient has both medicare and medicaid.  Patient  takes the bus to MD appointments. Verified patient has PCP [facility]. Patient states they currently receive HH services through no one.  Patient was provided choice and selected [facility] for home health needs if they arise. PT recommended outpatient PT follow up.   Plan: CM will continue to follow for discharge planning and [facility] resources.   Lawerance Sabal RN BSN CM [phone]         Action/Plan:  Referral made to [facility], 3in1 will be delivered o room through [facility] prior to discharge.   Expected Discharge Date:                  Expected Discharge Plan:  Home/Self Care  In-House Referral:     Discharge planning Services     Post Acute Care Choice:  Durable Medical Equipment Choice offered to:     DME Arranged:  3-N-1 DME Agency:  [facility]  HH Arranged:    HH Agency:     Status of Service:     Medicare Important Message Given:  Yes-second notification given Date Medicare IM Given:    Medicare IM give by:    Date Additional Medicare IM Given:    Additional Medicare Important Message give by:     If discussed at Long Length of Stay Meetings, dates  discussed:    Additional Comments:  Lawerance Sabal, RN 07/12/2015, 2:41 PM

## 2015-07-13 LAB — CULTURE, BLOOD (ROUTINE X 2)
CULTURE: NO GROWTH
Culture: NO GROWTH

## 2015-07-13 LAB — GLUCOSE, CAPILLARY
Glucose-Capillary: 113 mg/dL — ABNORMAL HIGH (ref 65–99)
Glucose-Capillary: 201 mg/dL — ABNORMAL HIGH (ref 65–99)
Glucose-Capillary: 287 mg/dL — ABNORMAL HIGH (ref 65–99)
Glucose-Capillary: 183 mg/dL — ABNORMAL HIGH (ref 65–99)

## 2015-07-13 LAB — BASIC METABOLIC PANEL
Anion gap: 5 (ref 5–15)
BUN: 12 mg/dL (ref 6–20)
CALCIUM: 8.1 mg/dL — AB (ref 8.9–10.3)
CO2: 32 mmol/L (ref 22–32)
CREATININE: 0.71 mg/dL (ref 0.44–1.00)
Chloride: 105 mmol/L (ref 101–111)
Glucose, Bld: 155 mg/dL — ABNORMAL HIGH (ref 65–99)
Potassium: 3.2 mmol/L — ABNORMAL LOW (ref 3.5–5.1)
SODIUM: 142 mmol/L (ref 135–145)

## 2015-07-13 LAB — MAGNESIUM: MAGNESIUM: 1.7 mg/dL (ref 1.7–2.4)

## 2015-07-13 MED ORDER — POTASSIUM CHLORIDE CRYS ER 20 MEQ PO TBCR
20.0000 meq | EXTENDED_RELEASE_TABLET | Freq: Once | ORAL | Status: AC
  Administered 2015-07-13: 20 meq via ORAL
  Filled 2015-07-13: qty 1

## 2015-07-13 MED ORDER — FLUCONAZOLE 100 MG PO TABS
150.0000 mg | ORAL_TABLET | Freq: Once | ORAL | Status: AC
  Administered 2015-07-13: 150 mg via ORAL
  Filled 2015-07-13: qty 2

## 2015-07-13 MED ORDER — POTASSIUM CHLORIDE CRYS ER 20 MEQ PO TBCR
40.0000 meq | EXTENDED_RELEASE_TABLET | Freq: Every day | ORAL | Status: DC
  Administered 2015-07-14 – 2015-07-15 (×2): 40 meq via ORAL
  Filled 2015-07-13 (×2): qty 2

## 2015-07-13 MED ORDER — LOSARTAN POTASSIUM 25 MG PO TABS
12.5000 mg | ORAL_TABLET | Freq: Every day | ORAL | Status: DC
  Administered 2015-07-14 – 2015-07-15 (×2): 12.5 mg via ORAL
  Filled 2015-07-13 (×3): qty 0.5

## 2015-07-13 MED ORDER — SPIRONOLACTONE 25 MG PO TABS: 12.5000 mg | ORAL_TABLET | Freq: Every day | ORAL | Status: DC

## 2015-07-13 MED ORDER — AMOXICILLIN-POT CLAVULANATE 875-125 MG PO TABS
1.0000 | ORAL_TABLET | Freq: Two times a day (BID) | ORAL | Status: DC
  Administered 2015-07-13 – 2015-07-15 (×5): 1 via ORAL
  Filled 2015-07-13 (×5): qty 1

## 2015-07-13 NOTE — Progress Notes (Signed)
Family Medicine Teaching Service Daily Progress Note Intern Pager: [phone]  Patient name: Makayla Vasquez Medical record number: [id number] Date of birth: 24-Jan-1981 Age: 34 y.o. Gender: female  Primary Care Provider: Nobie Putnam, DO Consultants: HF Code Status: FULL  Pt Overview and Major Events to Date:  9/18: admitted for hypotension likely secondary to adrenal suppression from chronic steroid use with recent decrease in Prednisone dose. Started on [place] until completion of ACTH Stimulation test 9/19: Transitioned to Hydrocortisone, with improvement of BPs 9/21: transitioned to PO prednisone; BP  9/22: BP soft/low after transition to Prednisone; held antihypertensives and lasix except Bisoprolol  per HF   Assessment and Plan: Makayla Vasquez is a 34 y.o. female presenting with SOB, weakness, fatigue for 2 weeks. Admitted for hypotension likely secondary to adrenal suppression from chronic steroid use with recent decrease in Prednisone dose. 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 S/p 1L bolus x 3 in ED. ACTH stimulation test positive: base 7.4, 30 min 13.6, 39mn 15.9. BPs soft overnight: 87-118/41-62, with AM SBPs in 80s-90s.  - transferred to med-surg (9/22) - PO Prednisone 434mdaily (started 9/21) - Per pharmacy, will monitor for symptoms on current dose, then will taper starting with 30-3586mor 1 week, then 99m53mr 1 week, then 10mg78m 1 week, then 5-10 mg depending on symptoms; may need to add Flurinef for mineralocorticoid  - will discuss with HF regarding low BPs in the setting of chronic CHF and possible need for Flurinef - Will monitor blood pressures closely.  - Blood cultures: NGTD    Diastolic Congestive Heart Failure: Last ECHO (01/2015): EF 45-50%, diffuse hypokinesis, grade 1 diastolic dysfunction, mild mitral regurgitation, PA peak pressure 34 mmHg. Cardiologist is Dr. BensiHaroldine Laws fluid overloaded to exam. Received 1L NS bolus x 3 in the ED. BNP in the ED was 34-> 177.4. Repeat ECHO: EF 50%, G2DD, mild MR/TR, mild elevation of pulmonary pressure, small pericardial effusion. Urine output over 24 hrs 699ml 38m net  -5L. Weight today 187 (from 185); admin wt 180 (however recent outpatient wt 187 up to low 190s). No clinical signs of fluid overload   - will follow up on Heart Failure reccs, appreciate reccs: held antihypertensives and lasix 9/22 due to low/soft BP; continued Bisoprolol  - on Kdur 99mEq 29my   Shortness of breath: Likely an exacerbation of underlying chronic interstitial lung disease. On 4L O2 at home. Symptoms worsened after Prednisone dose was decreased to 5mg qd.39mute infectious process is less likely because of a normal WBC count and Pt has been afebrile. CXR shows chronically increased interstitial markings. CTA chest shows severe bilaterally symmetric ground-glass consolidation, chronic in nature. Lungs sound clear on exam. Not requiring more than her baseline O2. RR: 18-29 - Given Levaquin and then Vanc/Zosyn in the ED. Will not continue abx at this time, because we do not think that she has a pneumonia. - Continue home Albuterol and Symbicort - Continue 4L O2 by nasal cannula. Titrate to keep O2 sats >90%. - Continuous pulse oximetry  Sinusitis: Notes of 1 week of rhinorrhea and ear fullness. Symptoms worsened yesterday. Notes of postnasal drip with productive cough due to this.  - will consider adding antibiotics for possible bacterial sinusitis, however this could be viral in etiology  Abdominal  Pain with diarrhea: 1-2 week history of crampy epigastric abdominal pain associated with watery non-bloody diarrhea. Hx of IBS but symptoms not similar  to this per patient. Hx of heartburn. S/p cholecystectomy. LFT: alk phos nml, mild elevation of AST 70, ALT 58 although improving from admission. Lipase wnl. At home has BM x3-4 days unless having diarrhea. Currently not concerning for infectious process. Had BM overnight: soft.  - continue Protonix 72m daily  - will monitor  T2DM, steroid-induced: Last HgbA1c 8.5 (05/04/2015). Home regimen: Metformin 10059mbid, Lantus 35-50 units qhs. Glucose was 117 in the ED. CBGs [id number] AM CBG 113. Required 15 units Novolog over 24 hours - Lantus 20 units qhs - Moderate SSI - CBGs before meals and at bedtime  Hypokalemia: 3.4 9/22-> 3.2 9/23, likely due to IV Lasix.  Kdur 2046monce (9/21; 9/22) - on Kdur 86m43maily  -will give Kdur 86mE49mce today for low K - will also check Mag - On spironolactone which will help keep K up (currenlty held)  Generalized LE Weakness: Per patient this is chronic in nature, Neuro exam was unremarkable  - PT/OT consult: rec outpatient PT with 3 in 1   Congenital complete AV block, with biventricular pacemaker: Tachycardic up to 130s in the ED, improved with 1L NS boluses x 3. HRs now in the 80s.  - Cardiac monitoring - Vitals per unit routine - per HF will interrogate pacemaker  Lupus: Recent diagnosis in 05/2015 - Will hold Prednisone 5mg q76m- Continue home CellCept for polyarthralgias  - Continue home med: Percocet 5-325mg q94m for severe pain  HLD - Continue home Lipitor 40mg  H61mry of HTN - now back on Bisoprolol, Spironolactone.  - plan to slowly increase Losartan up to home dose  Asymptomatic Bacteriuria: UA showed few bacteria, large Hgb, 15 ketones, trace leukocytes, positive nitrites, 0-2 WBC. Patient is asymptomatic. Urine culture collected on admission and is pending - no abx tx indicated  FEN/GI: Heart healthy carb-modified diet, 1/2 MIVFs: NS @ 60ml/hr 1mhe setting of CHF Prophylaxis: Lovenox  [facility]: [facility] until blood pressures are stable on PO steroids  Subjective:  Patient notes of HA, sinus congestion, and ear fullness this morning. Notes 1 week of ear fullness and rhinorrhea. No rhinorrhea today, but notes postnasal drip. She does need to cough up sputum due to this but unable to characterize sputum. States her symptoms seem to worsen yesterday.   Objective: Temp:  [97.6 F (36.4 C)-98.3 F (36.8 C)] 98.1 F (36.7 C) (09/23 0415) Pulse Rate:  [60-89] 67 (09/23 0415) Resp:  [16-22] 20 (09/23 0415) BP: (84-118)/(41-62) 97/51 mmHg (09/23 0415) SpO2:  [99 %-100 %] 99 % (09/23 0415) Weight:  [187 lb 13.3 oz (85.2 kg)] 187 lb 13.3 oz (85.2 kg) (09/23 0704) Physical Exam: General: NAD sitting in chair and eating HEENT:  Neck: no lymphadenopathy  Cardiovascular: RRR, no m/r/g, no JVD or LE edema Respiratory: CTAB, no crackles on exam today; no wheezing Abdomen: + BS, nondistended, nontender to palpation  Extremities: no LE edema   Laboratory:  Recent Labs Lab 07/08/15 0155 07/09/15 0215  WBC 10.1 6.5  HGB 12.7 10.2*  HCT 39.3 33.0*  PLT 418* 331    Recent Labs Lab 07/08/15 1329 07/09/15 0215  07/11/15 0237 07/12/15 0250 07/13/15 0605  NA 136 136  < > 136 139 142  K 4.3 4.1  < > 3.6 3.4* 3.2*  CL 107 107  < >  103 100* 105  CO2 23 22  < > 26 32 32  BUN 9 10  < > 14 13 12   CREATININE 0.69 0.63  < > 0.81 0.69 0.71  CALCIUM 7.8* 7.8*  < > 8.0* 8.2* 8.1*  PROT 5.9* 6.0*  --   --   --   --   BILITOT 0.8 0.6  --   --   --   --   ALKPHOS 48 48  --   --   --   --   ALT 65* 58*  --   --   --   --   AST 97* 70*  --   --   --   --   GLUCOSE 147* 182*  < > 240* 212* 155*  < > = values in this interval not displayed.    Smiley Houseman, MD 07/13/2015, 8:20 AM PGY-1, [place] Intern pager: [phone], text pages welcome

## 2015-07-13 NOTE — Care Management Important Message (Signed)
Important Message  Patient Details  Name: Makayla Vasquez MRN: [id number] Date of Birth: [birth year]   Medicare Important Message Given:  Yes-third notification given    Bernadette Hoit 07/13/2015, 11:13 AM

## 2015-07-13 NOTE — Progress Notes (Signed)
Patient ID: Makayla Vasquez, female   DOB: 01-29-1981, 34 y.o.   MRN: [id number]   Advanced Heart Failure Rounding Note  Primary Physician: Makayla Pais, DO  Subjective:    Feels ok this morning.  Walked yesterday without lightheadedness though BP remains soft.  No dyspnea.    Objective:   Weight Range: 187 lb 13.3 oz (85.2 kg) Body mass index is 31.26 kg/(m^2).   Vital Signs:   Temp:  [97.6 F (36.4 C)-98.3 F (36.8 C)] 98.1 F (36.7 C) (09/23 0415) Pulse Rate:  [60-89] 67 (09/23 0415) Resp:  [16-22] 20 (09/23 0415) BP: (84-118)/(41-62) 97/51 mmHg (09/23 0415) SpO2:  [99 %-100 %] 99 % (09/23 0415) Weight:  [187 lb 13.3 oz (85.2 kg)] 187 lb 13.3 oz (85.2 kg) (09/23 0704) Last BM Date: 07/11/15  Weight change: Filed Weights   07/11/15 0413 07/12/15 0632 07/13/15 0704  Weight: 187 lb 11.2 oz (85.14 kg) 185 lb 10 oz (84.2 kg) 187 lb 13.3 oz (85.2 kg)    Intake/Output:   Intake/Output Summary (Last 24 hours) at 07/13/15 0827 Last data filed at 07/12/15 2200  Gross per 24 hour  Intake    370 ml  Output      0 ml  Net    370 ml     Physical Exam: General: Well appearing. No resp difficulty HEENT: 02 via [place]: supple. JVP 7 cm. Carotids 2+ bilat; no bruits. No lymphadenopathy or thyromegaly noted. Cor: PMI nondisplaced. RRR. No rubs, gallops or murmurs appreciated Lungs: CTAB Abdomen: soft, nontender, nondistended. No hepatosplenomegaly. No bruits or masses. +BS Extremities: no cyanosis, clubbing, rash. Trace ankle edema Neuro: alert & orientedx3, cranial nerves grossly intact. moves all 4 extremities w/o difficulty.   Telemetry: A sensed V paced 60s.  Labs: CBC No results for input(s): WBC, NEUTROABS, HGB, HCT, MCV, PLT in the last 72 hours. Basic Metabolic Panel  Recent Labs  07/12/15 0250 07/13/15 0605  NA 139 142  K 3.4* 3.2*  CL 100* 105  CO2 32 32  GLUCOSE 212* 155*  BUN 13 12  CALCIUM 8.2* 8.1*   Liver Function Tests No results for  input(s): AST, ALT, ALKPHOS, BILITOT, PROT, ALBUMIN in the last 72 hours. No results for input(s): LIPASE, AMYLASE in the last 72 hours. Cardiac Enzymes No results for input(s): CKTOTAL, CKMB, CKMBINDEX, TROPONINI in the last 72 hours.  BNP: BNP (last 3 results)  Recent Labs  10/24/14 0930 07/08/15 0155 07/09/15 1430  BNP 16.4 34.4 177.4*    ProBNP (last 3 results) No results for input(s): PROBNP in the last 8760 hours.   D-Dimer No results for input(s): DDIMER in the last 72 hours. Hemoglobin A1C No results for input(s): HGBA1C in the last 72 hours. Fasting Lipid Panel No results for input(s): CHOL, HDL, LDLCALC, TRIG, CHOLHDL, LDLDIRECT in the last 72 hours. Thyroid Function Tests No results for input(s): TSH, T4TOTAL, T3FREE, THYROIDAB in the last 72 hours.  Invalid input(s): FREET3  Other results:     Imaging/Studies:  No results found.  Latest Echo  Latest Cath   Medications:     Scheduled Medications: . atorvastatin  40 mg Oral Daily  . bisoprolol  2.5 mg Oral Daily  . calcium-vitamin D  1 tablet Oral Q breakfast  . enoxaparin (LOVENOX) injection  40 mg Subcutaneous Q24H  . insulin aspart  0-15 Units Subcutaneous TID WC  . insulin aspart  0-5 Units Subcutaneous QHS  . insulin glargine  20 Units Subcutaneous Q2200  .  mycophenolate  500 mg Oral BID  . pantoprazole  40 mg Oral Daily  . potassium chloride SA  20 mEq Oral Daily  . predniSONE  40 mg Oral Q breakfast  . sodium chloride  3 mL Intravenous Q12H  . spironolactone  12.5 mg Oral Daily    Infusions:    PRN Medications: sodium chloride, albuterol, budesonide-formoterol, ondansetron **OR** ondansetron (ZOFRAN) IV, oxyCODONE-acetaminophen, sodium chloride   Assessment/Plan   Makayla Vasquez is a 34 year old woman with h/o obesity, chronic systolic heart failure echo 06/2015 50-55%, congenital high-grade heart block (mother has SLE) s/p CRT-P, Chronic lung disease/pneumonitis on chronic steroids,  Lupus, and DM2 who presented to [facility] with acutely worsening SOB and hypotension. The HF team was consulted for optimal management of her HF meds. 1. Hypotension: Suspect adrenal suppression from chronic prednisone use with recent decrease in steroids.  Improved with stress dosing of hydrocortisone IV, now on prednisone.  BP lower since switch to prednisone. Would avoid fludrocortisone given CHF.  2. SLE: She has been on prednisone for this. 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 Chronic systolic HF: 07/09/15 echo showed improved EF 50-55%.  Has Medtronic CRT-D.  Diuresed initially, now looks euvolemic.  - Continue bisoprolol, can try to add back low dose losartan today.  - Aim to restart Lasix 20 mg daily tomorrow.  4. Congenital AVN block s/p CRT.   Will need follow up in HF clinic.  Length of Stay: 5  Makayla Vasquez 07/13/2015, 8:27 AM

## 2015-07-14 LAB — GLUCOSE, CAPILLARY
GLUCOSE-CAPILLARY: 156 mg/dL — AB (ref 65–99)
GLUCOSE-CAPILLARY: 275 mg/dL — AB (ref 65–99)
Glucose-Capillary: 139 mg/dL — ABNORMAL HIGH (ref 65–99)
Glucose-Capillary: 286 mg/dL — ABNORMAL HIGH (ref 65–99)

## 2015-07-14 LAB — BASIC METABOLIC PANEL
Anion gap: 6 (ref 5–15)
BUN: 10 mg/dL (ref 6–20)
CHLORIDE: 104 mmol/L (ref 101–111)
CO2: 29 mmol/L (ref 22–32)
CREATININE: 0.7 mg/dL (ref 0.44–1.00)
Calcium: 8.2 mg/dL — ABNORMAL LOW (ref 8.9–10.3)
GFR calc non Af Amer: >60 mL/min (ref >60)
GLUCOSE: 226 mg/dL — AB (ref 65–99)
Potassium: 3.7 mmol/L (ref 3.5–5.1)
Sodium: 139 mmol/L (ref 135–145)

## 2015-07-14 MED ORDER — FUROSEMIDE 20 MG PO TABS
20.0000 mg | ORAL_TABLET | Freq: Every day | ORAL | Status: DC
  Administered 2015-07-14 – 2015-07-15 (×2): 20 mg via ORAL
  Filled 2015-07-14 (×2): qty 1

## 2015-07-14 NOTE — Progress Notes (Signed)
Family Medicine Teaching Service Daily Progress Note Intern Pager: [phone]  Patient name: Makayla Vasquez Medical record number: [id number] Date of birth: 1981/03/24 Age: 34 y.o. Gender: female  Primary Care Provider: Nobie Putnam, DO Consultants: HF Code Status: FULL  Pt Overview and Major Events to Date:  9/18: admitted for hypotension likely secondary to adrenal suppression from chronic steroid use with recent decrease in Prednisone dose. Started on Decaron until completion of ACTH Stimulation test 9/19: Transitioned to Hydrocortisone, with improvement of BPs 9/21: transitioned to PO prednisone; BP  9/22: BP soft/low after transition to Prednisone; held antihypertensives and lasix except Bisoprolol  per HF   Assessment and Plan: Makayla Vasquez is a 34 y.o. female presenting with SOB, weakness, fatigue for 2 weeks. Admitted for hypotension likely secondary to adrenal suppression from chronic steroid use with recent decrease in Prednisone dose. 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 ACTH stimulation test positive. BPs slightly soft overnight. Patient has been asymptomatic - PO Prednisone 83m daily (started 9/21) - Per pharmacy, will monitor for symptoms on current dose, then will taper starting with 30-319mfor 1 week, then 2067mor 1 week, then 81m32mr 1 week, then 5-10 mg depending on symptoms - Florinef considered but not recommended per cardiology  - Will monitor blood pressures closely.  - Blood cultures: NGTD  Diastolic Congestive Heart Failure: Appears stable. No clinical signs of fluid overload. - will follow up on Heart Failure recs, appreciate reccs: held antihypertensives and lasix  9/22 due to low/soft BP; continued Bisoprolol  - on Kdur 20mE101mily   Shortness of breath: Likely an exacerbation of underlying chronic interstitial lung disease. On 4L O2 at home. Symptoms worsened after Prednisone dose was decreased to 5mg q6mAcute infectious process is less likely because of a normal WBC count and Pt has been afebrile.CXR shows chronically increased interstitial markings. CTA chest shows severe bilaterally symmetric ground-glass consolidation, chronic in nature. Lungs sound clear on exam. Not requiring more than her baseline O2. Antibiotics d/c'd - Continue home Albuterol and Symbicort - Continue 4L O2 by nasal cannula. Titrate to keep O2 sats >90%. - Continuous pulse oximetry  Sinusitis: Notes of 1 week of rhinorrhea and ear fullness. Symptoms worsened yesterday. Notes of postnasal drip with productive cough due to this.  - Augmentin (9/23>>  Abdominal Pain with diarrhea: 1-2 week history of crampy epigastric abdominal pain associated with watery non-bloody diarrhea. Hx of IBS but symptoms not similar to this per patient. Hx of heartburn. S/p cholecystectomy. LFT: alk phos nml, mild elevation of AST 70, ALT 58 although improving from admission. Lipase wnl. At home has BM x3-4 days unless having diarrhea. Currently not concerning for infectious process. - continue Protonix 40mg d12m  - will monitor  T2DM, steroid-induced: Last HgbA1c 8.5 (05/04/2015). Fasting CBG of 156. Received 13 units of Novolog in last 24 hours - Lantus 20 units qhs - Moderate SSI - CBGs before meals and at bedtime - holding home metformin  Hypokalemia: 3.4 9/22-> 3.2 9/23, likely due to IV Lasix.  Kdur 20mEq o39m(9/21; 9/22) - on Kdur 20mEq da38m - will also check Mag - On spironolactone which will help keep K up (currenlty held)  Generalized LE Weakness: Per patient this is chronic in nature, Neuro exam was unremarkable  -  PT/OT consult: rec outpatient PT with 3 in 1   Congenital  complete AV block, with biventricular pacemaker: Tachycardic up to 130s in the ED, improved with 1L NS boluses x 3. HRs now in the 80s.  - Cardiac monitoring - Vitals per unit routine - per HF will interrogate pacemaker  Lupus: Recent diagnosis in 05/2015 - Continue home CellCept for polyarthralgias  - Continue home med: Percocet 5-346m q6hrs for severe pain - steroids as above  HLD - Continue home Lipitor 488m History of HTN - now back on Bisoprolol, losartan - spironolactone held - plan to slowly increase Losartan up to home dose  Asymptomatic Bacteriuria: UA showed few bacteria, large Hgb, 15 ketones, trace leukocytes, positive nitrites, 0-2 WBC. Patient is asymptomatic. Urine culture collected on admission and is pending - no abx tx indicated  FEN/GI: Heart healthy carb-modified diet, KVO Prophylaxis: Lovenox  Disposition: Discharge home pending blood pressures are stable on PO steroids  Subjective:  Patient states no concerns today. She is breathing well. No nasal congestion today. She has been ambulating around the room and has been asymptomatic. She has not tried ambulating outside her room.  Objective: Temp:  [97.6 F (36.4 C)-98.6 F (37 C)] 98.3 F (36.8 C) (09/24 0502) Pulse Rate:  [58-75] 61 (09/24 0547) Resp:  [18-22] 18 (09/24 0502) BP: (79-108)/(35-65) 87/37 mmHg (09/24 0547) SpO2:  [98 %-100 %] 99 % (09/24 0502) Weight:  [190 lb 0.6 oz (86.2 kg)] 190 lb 0.6 oz (86.2 kg) (09/24 0502)  Physical Exam: General: NAD lying in bed, on her phone, no distress Cardiovascular: RRR, early systolic murmur, no JVD or LE edema Respiratory: CTAB, no crackles on exam today; no wheezing Abdomen: + BS, nondistended, nontender to palpation  Extremities: no LE edema   Laboratory:  Recent Labs Lab 07/08/15 0155 07/09/15 0215  WBC 10.1 6.5  HGB 12.7 10.2*  HCT 39.3 33.0*  PLT 418* 331    Recent Labs Lab 07/08/15 1329 07/09/15 0215  07/12/15 0250  07/13/15 0605 07/14/15 0612  NA 136 136  < > 139 142 139  K 4.3 4.1  < > 3.4* 3.2* 3.7  CL 107 107  < > 100* 105 104  CO2 23 22  < > 32 32 29  BUN 9 10  < > 13 12 10   CREATININE 0.69 0.63  < > 0.69 0.71 0.70  CALCIUM 7.8* 7.8*  < > 8.2* 8.1* 8.2*  PROT 5.9* 6.0*  --   --   --   --   BILITOT 0.8 0.6  --   --   --   --   ALKPHOS 48 48  --   --   --   --   ALT 65* 58*  --   --   --   --   AST 97* 70*  --   --   --   --   GLUCOSE 147* 182*  < > 212* 155* 226*  < > = values in this interval not displayed.    RaMariel AloeMD 07/14/2015, 8:24 AM PGY-3, [facility] pager: [phone] pages welcome

## 2015-07-14 NOTE — Progress Notes (Signed)
Plan per Dr. Shirlee Latch to restart lasix 20 mg daily today. Follow-up in Advanced CHF clinic after discharge. Cardiology will sign-off. Call with questions.  Chrystie Nose, MD, [facility] Attending Cardiologist [facility]

## 2015-07-14 NOTE — Progress Notes (Signed)
Pt's BP is 79/35 mm of hg, rechecked BP is 87/37. Pt is asymptomatic. On-call provider made aware . No new orders received at this time. Will cont to monitor.

## 2015-07-15 LAB — BASIC METABOLIC PANEL
Anion gap: 5 (ref 5–15)
BUN: 15 mg/dL (ref 6–20)
CHLORIDE: 98 mmol/L — AB (ref 101–111)
CO2: 31 mmol/L (ref 22–32)
Calcium: 8.4 mg/dL — ABNORMAL LOW (ref 8.9–10.3)
Creatinine, Ser: 0.74 mg/dL (ref 0.44–1.00)
GFR calc non Af Amer: >60 mL/min (ref >60)
Glucose, Bld: 154 mg/dL — ABNORMAL HIGH (ref 65–99)
POTASSIUM: 4.4 mmol/L (ref 3.5–5.1)
SODIUM: 134 mmol/L — AB (ref 135–145)

## 2015-07-15 LAB — CBC
HEMATOCRIT: 34.1 % — AB (ref 36.0–46.0)
Hemoglobin: 10.4 g/dL — ABNORMAL LOW (ref 12.0–15.0)
MCH: 25.9 pg — ABNORMAL LOW (ref 26.0–34.0)
MCHC: 30.5 g/dL (ref 30.0–36.0)
MCV: 84.8 fL (ref 78.0–100.0)
Platelets: 319 10*3/uL (ref 150–400)
RBC: 4.02 MIL/uL (ref 3.87–5.11)
RDW: 17 % — ABNORMAL HIGH (ref 11.5–15.5)
WBC: 9.3 10*3/uL (ref 4.0–10.5)

## 2015-07-15 LAB — GLUCOSE, CAPILLARY
Glucose-Capillary: 146 mg/dL — ABNORMAL HIGH (ref 65–99)
Glucose-Capillary: 185 mg/dL — ABNORMAL HIGH (ref 65–99)

## 2015-07-15 MED ORDER — LOSARTAN POTASSIUM 25 MG PO TABS: 12.5000 mg | ORAL_TABLET | Freq: Every day | ORAL | Status: DC

## 2015-07-15 MED ORDER — FUROSEMIDE 20 MG PO TABS: 20.0000 mg | ORAL_TABLET | Freq: Every day | ORAL | Status: DC

## 2015-07-15 MED ORDER — INSULIN GLARGINE 100 UNIT/ML SOLOSTAR PEN: 20.0000 [IU] | PEN_INJECTOR | Freq: Every day | SUBCUTANEOUS | Status: DC

## 2015-07-15 MED ORDER — AMOXICILLIN-POT CLAVULANATE 875-125 MG PO TABS: 1.0000 | ORAL_TABLET | Freq: Two times a day (BID) | ORAL | Status: DC

## 2015-07-15 MED ORDER — POTASSIUM CHLORIDE CRYS ER 20 MEQ PO TBCR: 20.0000 meq | EXTENDED_RELEASE_TABLET | Freq: Every day | ORAL | Status: DC

## 2015-07-15 MED ORDER — BISOPROLOL FUMARATE 5 MG PO TABS: 2.5000 mg | ORAL_TABLET | Freq: Every day | ORAL | Status: DC

## 2015-07-15 MED ORDER — PANTOPRAZOLE SODIUM 40 MG PO TBEC: 40.0000 mg | DELAYED_RELEASE_TABLET | Freq: Every day | ORAL | Status: DC

## 2015-07-15 MED ORDER — INSULIN PEN NEEDLE 32G X 4 MM MISC: Status: DC

## 2015-07-15 MED ORDER — PREDNISONE 20 MG PO TABS
40.0000 mg | ORAL_TABLET | Freq: Every day | ORAL | Status: DC
Start: 2015-07-15 — End: 2015-10-07

## 2015-07-15 NOTE — Discharge Instructions (Signed)
You were admitted to [facility] because you had low blood pressures. This was likey due to a recent decrease in your Prednisone dose for your chronic lung disease. You were treated with IV steroids then transitioned to oral Prednisone 40mg  daily. Your primary care doctor will titrate this dose down over the next few weeks. You have an hospital follow up clinic appointment on 10/3 at 9:45 with your PCP. Please keep this appointment. If you start to experience similar symptoms that brought you to the hospital please seek medical care immediately .  You were also seen by the Heart Failure Team during your hospitalization. They made a few changes to your medications. Please take these medications as prescribed. You also have a follow up appointment at Heart Failure Clinic. Please be sure to keep this appointment.   You were also started on Augmentin for a sinus infection; we have prescribed you Augmentin to finish a total 7 day course of antibiotics for this.   Your prescriptions were sent to [facility] on . You should be able to get these medications there without any issues. Since you were started back on lantus, please purchase a [place] Glucose Meter and strips at [facility] so that you can monitor your blood sugars. You should check your blood sugars everyday before breakfast; please keep a record of your blood sugars and take this with you to your primary care doctor's appointment.   We also ordered outpatient physical therapy for you.

## 2015-07-15 NOTE — Progress Notes (Signed)
Pt left via [place]. Dad waiting in vehicle.

## 2015-07-15 NOTE — Progress Notes (Signed)
Family Medicine Teaching Service Daily Progress Note Intern Pager: [phone]  Patient name: Makayla Vasquez Medical record number: [id number] Date of birth: 1981-09-09 Age: 34 y.o. Gender: female  Primary Care Provider: Nobie Putnam, DO Consultants: Cardiology Code Status: FULL  Pt Overview and Major Events to Date:  9/18: admitted for hypotension likely secondary to adrenal suppression from chronic steroid use with recent decrease in Prednisone dose. Started on Decaron until completion of ACTH Stimulation test 9/19: Transitioned to Hydrocortisone, with improvement of BPs 9/21: transitioned to PO prednisone; BP  9/22: BP soft/low after transition to Prednisone; held antihypertensives and lasix except Bisoprolol per HF  9/24: Started lasix 77m daily and Cozzar per HF  Assessment and Plan: Makayla MARGOLISis a 34y.o. female presenting with SOB, weakness, fatigue for 2 weeks. Admitted for hypotension likely secondary to adrenal suppression from chronic steroid use with recent decrease in Prednisone dose. 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 ACTH stimulation test positive. BPs overall improved over 24 hours (87-126/37-65). Patient has been asymptomatic. - PO Prednisone 444mdaily (started 9/21) - Per pharmacy, will monitor for symptoms on current dose, then will taper starting with 30-3541mor 1 week, then 57m62mr 1 week, then 10mg64m 1 week, then 5-10 mg depending on symptoms - Florinef considered but not recommended per cardiology  - Blood cultures: NGTD - Since pressures have been stable and patient is asymptomatic will discharge today   Diastolic Congestive Heart Failure: Appears stable. No  clinical signs of fluid overload. - will follow up on Heart Failure recs, appreciate reccs: Lasix 57mg 53marted 9/24, Cozaar 12.5mg da29m restarted 9/24; signed off 9/24 and will Follow-up in Advanced CHF clinic after discharge. - Bisoprolol 2.5mg - K60m 57mEq da63m  Shortness of breath: Likely an exacerbation of underlying chronic interstitial lung disease. On 4L O2 at home. Symptoms worsened after Prednisone dose was decreased to 5mg qd. A22me infectious process is less likely because of a normal WBC count and Pt has been afebrile.CXR shows chronically increased interstitial markings. CTA chest shows severe bilaterally symmetric ground-glass consolidation, chronic in nature. Lungs sound clear on exam. Not requiring more than her baseline O2. Antibiotics d/c'd - Continue home Albuterol and Symbicort - Continue 4L O2 by nasal cannula. Titrate to keep O2 sats >90%. - Continuous pulse oximetry  Sinusitis: Notes of 1 week of rhinorrhea and ear fullness. Symptoms worsened yesterday. Notes of postnasal drip with productive cough due to this.  - Augmentin (9/23>>). Will discharge with Abx to likely complete 7 day course   Abdominal Pain with diarrhea: 1-2 week history of crampy epigastric abdominal pain associated with watery non-bloody diarrhea. Hx of IBS but symptoms not similar to this per patient. Hx of heartburn. S/p cholecystectomy. LFT: alk phos nml, mild elevation of AST 70, ALT 58 although improving from admission. Lipase wnl. At home has BM x3-4 days unless having diarrhea. Currently not concerning for infectious process. - continue Protonix 40mg daily34mwill monitor  T2DM, steroid-induced: Last HgbA1c 8.5 (05/04/2015). CBG 139-286, Fasting CBG of 146. Received 16 units of Novolog in last 24 hours - Lantus 20 units qhs - Moderate SSI - CBGs before meals and at bedtime - holding home metformin  Hypokalemia: 3.4 9/22-> 3.2 9/23-> 3.7(9/24)-->4.4 (hemolyzed). - decreased back to  home  med Kdur 20 mEq daily   Generalized LE Weakness: Per patient this is chronic in nature, Neuro exam was unremarkable  - PT/OT consult: rec outpatient PT with 3 in 1   Congenital complete AV block, with biventricular pacemaker: Tachycardic up to 130s in the ED, improved with 1L NS boluses x 3. HRs now in the 80s.  - Cardiac monitoring - Vitals per unit routine - per HF will interrogate pacemaker  Lupus: Recent diagnosis in 05/2015 - Continue home CellCept for polyarthralgias  - Continue home med: Percocet 5-361m q6hrs for severe pain - steroids as above  HLD - Continue home Lipitor 438m History of HTN - now back on Bisoprolol, losartan  Asymptomatic Bacteriuria: UA showed few bacteria, large Hgb, 15 ketones, trace leukocytes, positive nitrites, 0-2 WBC. Patient is asymptomatic. Urine culture collected on admission and is pending - no abx tx indicated  FEN/GI: Heart healthy carb-modified diet, KVO Prophylaxis: Lovenox  Disposition: Discharge home pending blood pressures are stable on PO steroids  Subjective:  Patient states she is doing well. Has been ambulating in her room without symptoms  Objective: Temp:  [97.7 F (36.5 C)-98.7 F (37.1 C)] 98.4 F (36.9 C) (09/25 0505) Pulse Rate:  [58-79] 58 (09/25 0505) Resp:  [16-18] 18 (09/25 0505) BP: (87-126)/(41-97) 92/41 mmHg (09/25 0505) SpO2:  [86 %-100 %] 99 % (09/25 0505) Weight:  [191 lb 12.8 oz (87 kg)] 191 lb 12.8 oz (87 kg) (09/25 0505) Physical Exam: General: NAD lying in bed, on her phone, no distress Cardiovascular: RRR, early systolic murmur, no JVD or LE edema Respiratory: CTAB, no crackles on exam today; no wheezing Abdomen: + BS, nondistended, nontender to palpation  Extremities: no LE edema   Laboratory:  Recent Labs Lab 07/09/15 0215  WBC 6.5  HGB 10.2*  HCT 33.0*  PLT 331    Recent Labs Lab 07/08/15 1329 07/09/15 0215  07/12/15 0250 07/13/15 0605 07/14/15 0612  NA 136 136  < >  139 142 139  K 4.3 4.1  < > 3.4* 3.2* 3.7  CL 107 107  < > 100* 105 104  CO2 23 22  < > 32 32 29  BUN 9 10  < > _0 CREATININE 0.69 0.63  < > 0.69 0.71 0.70  CALCIUM 7.8* 7.8*  < > 8.2* 8.1* 8.2*  PROT 5.9* 6.0*  --   --   --   --   BILITOT 0.8 0.6  --   --   --   --   ALKPHOS 48 48  --   --   --   --   ALT 65* 58*  --   --   --   --   AST 97* 70*  --   --   --   --   GLUCOSE 147* 182*  < > 212* 155* 226*  < > = values in this interval not displayed.  KaSmiley HousemanMD 07/15/2015, 6:54 AM PGY-1, [facility] pager: [phone] pages welcome

## 2015-07-15 NOTE — Progress Notes (Signed)
PIV removed -pt tol well. Disch instr. F/u appt given to pt-pt states she understands. Pt waiting for ride.

## 2015-07-15 NOTE — Progress Notes (Signed)
CM met with pt in room to find the closest [place] and pt states it is Paediatric nurse.  Pt states she has no money for her Medicaid copays and CM suggested she ask the [facility] pharmacist to please waive the copays or give her enough medication to get her to her next check to pay the copays. Pt has a boyfriend who helps her out financially.  CM and pt discussed meters and strips and pt will request of her PCP prescriptions and arrangement for meter and strips to be provided through Medicaid (and she can have these sent through the mail).  Until that time, [facility] is the least expensive alternative.  Pt is not eligible for the [facility] program.  Please refer to previous CM note for additional information. CMcalled MD with [facility] contact [phone] to escribe prescriptions.  No other CM needs were communicated.

## 2015-07-17 LAB — GLUCOSE, CAPILLARY: Glucose-Capillary: 162 mg/dL — ABNORMAL HIGH (ref 65–99)

## 2015-07-23 ENCOUNTER — Encounter: Payer: Self-pay | Admitting: Family Medicine

## 2015-07-23 ENCOUNTER — Ambulatory Visit (INDEPENDENT_AMBULATORY_CARE_PROVIDER_SITE_OTHER): Payer: Medicare Other | Admitting: Family Medicine

## 2015-07-23 VITALS — BP 111/70 | HR 103 | Temp 98.0°F | Ht 65.0 in | Wt 183.0 lb

## 2015-07-23 DIAGNOSIS — J310 Chronic rhinitis: Secondary | ICD-10-CM

## 2015-07-23 DIAGNOSIS — E119 Type 2 diabetes mellitus without complications: Secondary | ICD-10-CM | POA: Diagnosis not present

## 2015-07-23 DIAGNOSIS — J849 Interstitial pulmonary disease, unspecified: Secondary | ICD-10-CM

## 2015-07-23 DIAGNOSIS — E2740 Unspecified adrenocortical insufficiency: Secondary | ICD-10-CM

## 2015-07-23 DIAGNOSIS — I10 Essential (primary) hypertension: Secondary | ICD-10-CM

## 2015-07-23 DIAGNOSIS — J329 Chronic sinusitis, unspecified: Secondary | ICD-10-CM | POA: Diagnosis not present

## 2015-07-23 DIAGNOSIS — E876 Hypokalemia: Secondary | ICD-10-CM

## 2015-07-23 DIAGNOSIS — E1169 Type 2 diabetes mellitus with other specified complication: Secondary | ICD-10-CM

## 2015-07-23 DIAGNOSIS — I5032 Chronic diastolic (congestive) heart failure: Secondary | ICD-10-CM | POA: Diagnosis not present

## 2015-07-23 DIAGNOSIS — E669 Obesity, unspecified: Secondary | ICD-10-CM

## 2015-07-23 DIAGNOSIS — R739 Hyperglycemia, unspecified: Secondary | ICD-10-CM

## 2015-07-23 LAB — BASIC METABOLIC PANEL WITH GFR
BUN: 20 mg/dL (ref 7–25)
CHLORIDE: 100 mmol/L (ref 98–110)
CO2: 24 mmol/L (ref 20–31)
Calcium: 9.5 mg/dL (ref 8.6–10.2)
Creat: 0.82 mg/dL (ref 0.50–1.10)
GFR, Est African American: >89 mL/min (ref >=60)
GFR, Est Non African American: >89 mL/min (ref >=60)
Glucose, Bld: 78 mg/dL (ref 65–99)
POTASSIUM: 3.8 mmol/L (ref 3.5–5.3)
Sodium: 145 mmol/L (ref 135–146)

## 2015-07-23 NOTE — Patient Instructions (Signed)
Dear Makayla Vasquez, Thank you for coming in to clinic today. It was good to see you!  1. Prednisone Taper - Take existing rx of Prednisone 20mg  tablets - Start today take 1 and a half tablet (30mg ) daily for 1 week, then reduce to 1 tablet (20mg ) daily for 1 week, then reduce to half tablet (10mg ) daily for at least 1 week but continue this until you return to see me in 1 month, we will discuss lowering to 5 mg in future - If at any point you feel sick again like you did before going to hospital with nausea , vomiting, low BP, dizziness, and like you may pass out, please go to clinic or Emergency Dept, you may need a higher dose or this may be too fast of a taper  2. Lantus regimen - Continue 35 units nightly as you are for next 2-3 days, if your blood sugar is still high >250 regularly, then increase to 40 units nightly for next 1-2 weeks, max dose up to 45 units daily before you return to visit me  3. Blood work today - will call you if need to change your Potassium pills - now keep taking once daily   Please schedule a follow-up appointment with Dr. in 1 month to review Diabetes, Prednisone  Call Rheumatologist office to see when next follow-up, discuss Prednisone taper with them  If you have any other questions or concerns, please feel free to call the clinic to contact me. You may also schedule an earlier appointment if necessary.  However, if your symptoms get significantly worse, please go to the Emergency Department to seek immediate medical attention.  , DO [facility]

## 2015-07-24 ENCOUNTER — Ambulatory Visit (HOSPITAL_COMMUNITY)
Admit: 2015-07-24 | Discharge: 2015-07-24 | Disposition: A | Discharge status: Home / Self Care | Payer: Medicare Other | Source: Ambulatory Visit | Attending: Internal Medicine | Admitting: Internal Medicine

## 2015-07-24 ENCOUNTER — Encounter (HOSPITAL_COMMUNITY): Payer: Self-pay

## 2015-07-24 VITALS — BP 102/66 | HR 91 | Wt 182.0 lb

## 2015-07-24 DIAGNOSIS — Z79899 Other long term (current) drug therapy: Secondary | ICD-10-CM | POA: Diagnosis not present

## 2015-07-24 DIAGNOSIS — J9611 Chronic respiratory failure with hypoxia: Secondary | ICD-10-CM

## 2015-07-24 DIAGNOSIS — Z794 Long term (current) use of insulin: Secondary | ICD-10-CM | POA: Insufficient documentation

## 2015-07-24 DIAGNOSIS — E119 Type 2 diabetes mellitus without complications: Secondary | ICD-10-CM

## 2015-07-24 DIAGNOSIS — I5022 Chronic systolic (congestive) heart failure: Secondary | ICD-10-CM | POA: Diagnosis not present

## 2015-07-24 DIAGNOSIS — Z7984 Long term (current) use of oral hypoglycemic drugs: Secondary | ICD-10-CM | POA: Diagnosis not present

## 2015-07-24 DIAGNOSIS — E669 Obesity, unspecified: Secondary | ICD-10-CM

## 2015-07-24 DIAGNOSIS — K219 Gastro-esophageal reflux disease without esophagitis: Secondary | ICD-10-CM | POA: Insufficient documentation

## 2015-07-24 DIAGNOSIS — M069 Rheumatoid arthritis, unspecified: Secondary | ICD-10-CM | POA: Diagnosis not present

## 2015-07-24 DIAGNOSIS — Z950 Presence of cardiac pacemaker: Secondary | ICD-10-CM | POA: Insufficient documentation

## 2015-07-24 DIAGNOSIS — E1165 Type 2 diabetes mellitus with hyperglycemia: Secondary | ICD-10-CM | POA: Diagnosis not present

## 2015-07-24 DIAGNOSIS — Z8249 Family history of ischemic heart disease and other diseases of the circulatory system: Secondary | ICD-10-CM | POA: Diagnosis not present

## 2015-07-24 DIAGNOSIS — E1169 Type 2 diabetes mellitus with other specified complication: Secondary | ICD-10-CM

## 2015-07-24 DIAGNOSIS — I428 Other cardiomyopathies: Secondary | ICD-10-CM | POA: Diagnosis not present

## 2015-07-24 DIAGNOSIS — J9610 Chronic respiratory failure, unspecified whether with hypoxia or hypercapnia: Secondary | ICD-10-CM | POA: Insufficient documentation

## 2015-07-24 DIAGNOSIS — Z87891 Personal history of nicotine dependence: Secondary | ICD-10-CM | POA: Diagnosis not present

## 2015-07-24 DIAGNOSIS — R918 Other nonspecific abnormal finding of lung field: Secondary | ICD-10-CM | POA: Diagnosis not present

## 2015-07-24 MED ORDER — POTASSIUM CHLORIDE CRYS ER 20 MEQ PO TBCR: 20.0000 meq | EXTENDED_RELEASE_TABLET | Freq: Every day | ORAL | Status: DC

## 2015-07-24 NOTE — Patient Instructions (Signed)
Follow up in 2-3 months with Amy  Do the following things EVERYDAY: 1. Weigh yourself in the morning before breakfast. Write it down and keep it in a log. 2. Take your medicines as prescribed 3. Eat low salt foods-Limit salt (sodium) to 2000 mg per day.  4. Stay as active as you can everyday 5. Limit all fluids for the day to less than 2 liters

## 2015-07-24 NOTE — Progress Notes (Addendum)
Patient ID: Makayla Vasquez, female   DOB: 06/23/1981, 34 y.o.   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   She has a h/o congenital HB and underwent first PPM at age 28. In 2011 had ECHO with EF 35-40%. LV dysfunction felt to be due to RV pacing so upgraded to CRT-P. Recent echo in 10/15 with EF 25%. She has had worsening HF symptoms and referred for evaluation for advanced HF therapies. She also has concomitant lung disease and this has limited b-blocker dosing.  She follows with Dr. Annamaria Boots for her lung disease. She is a former smoker.  Admitted in 8/15 and 9/15 with worsening respiratory status and severe hypoxia. Had extensive w/u. Thought to have a viral pneumonitis complicated by atelectasis and mild edema vs. an autoimmune process. Discharged home on a course of levaquin and prednisone.   She was offered a lung biopsy for further diagnosis versus empiric treatment with steroids. 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  Saw Dr. Annamaria Boots on 06/27/14. CXR with persistent infiltrates of unclear etiology. Prednisone weaned down now taking 36m one day and 116mthe next.  She returns for follow up. Recently hospitalized with hypotension and received fluids. Developed mild volume overload and was diuresed with IV lasix and later transitioned to lasix 20 mg daily. Today she is feeling pretty good. SOB/ PND/Orthopnea. Occasionally having "flutters" when walking. Occasionally uses electric cart at the grocery store. Wears 3 liters oxygen. Taking all medications. Not weighing on a regular basis. Most recent weight 182-183 pounds.    PFTs (12/15) showed a severe restrictive  defect concerning for an interstitial process.   EP study 10/15 for persistent tachycardia and found to have sinus tach.   06/16/14 CT scan showed bilateral progressive lower peribronchial infiltrates with groundglass changes and nodular patter consistent with acute pneumonitis of unclear etiology.  06/20/14 BAL showed 60% macrophages and 30% PMNs. Her AFB smear, fungal smear, legionella Ab, pneumocystis smear, ACE, and sputum culture are negative. Her AFB, legionella, fungal, and BAL culture as well as her fungal Ab, hypersensitivity pneumonitis panel were all negative Serology: DsDNA, RF, ANCA, HSP all negative  RHC 10/26/2014  RA = 2 RV = 27/1/5 PA = 29/7 (18) PCW = 4 Fick cardiac output/index = 4.5/2.2 PVR = 3.0 WU FA sat = 98% PA sat = 64%, 61%  Labs (10/15): TSH normal K 3.6, creatinine 1.1 Labs (10/31/2014) L k 3.9 Creatinine 1.06  Labs 07/23/2015: K 3.8 Creatinine 0.82  Review of Systems: All systems reviewed and negative except as per HPI.   Past Medical History  Diagnosis Date  . Cardiac pacemaker     a. Since age [age] 1961b. Upgrade to BiV in 2013.  . Congenital complete AV block   . Obesity   . GERD (gastroesophageal reflux disease)   . Asymptomatic LV dysfunction     a. Echo in Dec 2011 with EF 35 to 40%. Felt to be due to paced rhythm. b. EF 25-30% in 07/2014.  . Seizures ([facility]    as a child- from high fever  . Anxiety   . Bipolar affective disorder ([facility]  . Depression     bipolar  .  Carpal tunnel syndrome of right wrist   . Asthma     seasonal allergies   . Arthritis     rheumatoid arthritis- mild, no rheumatology care   . Hypertension   . Pneumonitis     a. a/w hypoxia - inflammatory - large workup 07/2014.  [name] Sinus tachycardia ([place])   . Diabetes mellitus without complication ([place])   . Presence of permanent cardiac pacemaker   . CHF (congestive heart failure) ([place])   . Lupus ([place])   . Lupus (systemic lupus erythematosus) ([place])     Current  Outpatient Prescriptions  Medication Sig Dispense Refill  . acetaminophen (TYLENOL) 500 MG tablet Take 500 mg by mouth every 6 (six) hours as needed for mild pain.    [name] albuterol (PROVENTIL HFA;VENTOLIN HFA) 108 (90 BASE) MCG/ACT inhaler Inhale 2 puffs into the lungs every 4 (four) hours as needed for wheezing or shortness of breath. 1 each 1  . atorvastatin (LIPITOR) 40 MG tablet Take 1 tablet (40 mg total) by mouth daily. 90 tablet 3  . bisoprolol (ZEBETA) 5 MG tablet Take 0.5 tablets (2.5 mg total) by mouth daily. 30 tablet 0  . Blood Glucose Monitoring Suppl (ONE TOUCH ULTRA 2) W/DEVICE KIT Check blood sugar daily before breakfast 1 each 0  . budesonide-formoterol (SYMBICORT) 80-4.5 MCG/ACT inhaler Inhale 2 puffs into the lungs 2 (two) times daily as needed (for shortness of breath).     . calcium-vitamin D (OSCAL WITH D) 500-200 MG-UNIT per tablet Take 1 tablet by mouth daily with breakfast.     . etonogestrel (NEXPLANON) 68 MG IMPL implant 1 each by Subdermal route once.    . furosemide (LASIX) 20 MG tablet Take 1 tablet (20 mg total) by mouth daily. 30 tablet 0  . glucose blood (ONE TOUCH ULTRA TEST) test strip Check blood sugar daily before breakfast 100 each 0  . Insulin Glargine (LANTUS SOLOSTAR) 100 UNIT/ML Solostar Pen Inject 35 Units into the skin daily at 10 pm.    . Insulin Pen Needle (INSUPEN PEN NEEDLES) 32G X 4 MM MISC Use with insulin pen 30 each 0  . losartan (COZAAR) 25 MG tablet Take 0.5 tablets (12.5 mg total) by mouth daily. 30 tablet 0  . metFORMIN (GLUCOPHAGE) 1000 MG tablet TAKE 1 TABLET BY MOUTH TWICE A DAY WITH A MEAL 30 tablet 11  . mycophenolate (CELLCEPT) 500 MG tablet Take 500 mg by mouth 2 (two) times daily.    . Nutritional Supplements (GLUCERNA ADVANCE SHAKE) LIQD Take supplement daily in place of 1 meal. 30 Bottle 2  . oxyCODONE-acetaminophen (PERCOCET) 5-325 MG per tablet Take 1 tablet by mouth every 6 (six) hours as needed for severe pain. 60 tablet 0  .  pantoprazole (PROTONIX) 40 MG tablet Take 1 tablet (40 mg total) by mouth daily. 30 tablet 0  . potassium chloride SA (KLOR-CON M20) 20 MEQ tablet Take 1 tablet (20 mEq total) by mouth daily. 30 tablet 0  . predniSONE (DELTASONE) 20 MG tablet Take 2 tablets (40 mg total) by mouth daily with breakfast. 60 tablet 0   No current facility-administered medications for this encounter.    Allergies  Allergen Reactions  . Sertraline Hcl Hives  . Tape Other (See Comments)    Burns skin      Social History   Social History  . Marital Status: Single    Spouse Name: N/A  . Number of Children: 1  . Years of Education: N/A   Occupational History  .  CNA    Social History Main Topics  . Smoking status: Former Smoker -- 0.25 packs/day for .5 years    Types: Cigarettes    Quit date: 07/25/1996  . Smokeless tobacco: Never Used  . Alcohol Use: No  . Drug Use: No  . Sexual Activity: Yes    Birth Control/ Protection: Implant, IUD     Comment: placed 04/2011   Other Topics Concern  . Not on file   Social History Narrative   Works at [employer].        Family History  Problem Relation Age of Onset  . Heart disease Mother     CHF (no details)  . Hypertension Mother   . Heart disease Father     Murmur  . Heart disease Sister 31     No details.  History of a pacemaker  . Lupus Mother     Danley Danker Vitals:   07/24/15 1013  BP: 102/66  Pulse: 91  Weight: 182 lb (82.555 kg)  SpO2: 97%    PHYSICAL EXAM: General:  Obese young woman wearing O2. No respiratory difficulty HEENT: normal Neck: supple. JVP ~6-7 Carotids 2+ bilat; no bruits. No lymphadenopathy or thryomegaly appreciated. Cor: PMI nonpalpable. Regular. No rubs, gallops or murmurs. Lungs: clear On 3 liters [place] Abdomen: obese soft, nontender, nondistended. No hepatosplenomegaly. No bruits or masses. Good bowel sounds. Extremities: no cyanosis, clubbing, rash, no edema Neuro: alert & oriented x 3, cranial nerves grossly intact.  moves all 4 extremities w/o difficulty. Affect pleasant.  ASSESSMENT & PLAN: 1) Chronic systolic HF: 10/2242 EF 97-53%. Echo today EF ~40-45% ?pacer lead dislodgement Nonischemic cardiomyopathy.  Medtronic device.  Had [place] in January with low filling pressures and adequate output.  NYHA class IIsymptoms.   Todays Optivol- fluid index flat, activity ~2 hours per da.y. No AF/VT. Congratulated today on medication compliance.  -  Volume status looks ok. Continue lasix 20 mg daily .  - Continue bisoprolol 2.5 mg daily. Will not titrate with fatigue.  - Continue losartan 12.5 mg daily.    -Off spiro for now. Hold off and reassess at next visit.   -Today I have asked her to weigh and record daily. She verbalized understanding.   - She understands she needs to prevent pregnanany. Nexplanon in place RUE.   2) Chronic respiratory failure with diffuse lung infiltrates of unknown etiology:  Lung infiltrates and symptoms much improved with prednisone.  3) Congenital AVN block s/p pacer 4) Diabetes- Uncontrolled- Asked to follow up with PCP.   Follow up in 2-3 months   CLEGG,AMY,NP-C 10:56 AM

## 2015-07-24 NOTE — Progress Notes (Signed)
Advanced Heart Failure Medication Review by a Pharmacist  Does the patient  feel that his/her medications are working for him/her?  yes  Has the patient been experiencing any side effects to the medications prescribed?  no  Does the patient measure his/her own blood pressure or blood glucose at home?  yes   Does the patient have any problems obtaining medications due to transportation or finances?   no  Understanding of regimen: good Understanding of indications: good Potential of compliance: good Patient understands to avoid NSAIDs. Patient understands to avoid decongestants.  Issues to address at subsequent visits: None   Pharmacist comments:  Makayla Vasquez is a pleasant 34 yo F presenting without a medication list but with good recall of her medications. She reports good compliance with her medications but states that she misses 3-4 of her evening doses a month. I recommended taking her HF medications in the morning to ensure that these are taken on a daily basis. She did state that her potassium supplements are difficult to swallow so I suggested cutting them in half and taking them a half at a time. She also stated that she was taken off of her spironolactone at her most recent hospital discharge and she wanted to make sure she should stay off of it. This will be relayed to Amy who will see her today. She did not have any other medication-related questions or concerns for me at this time.   Tyler Deis. Bonnye Fava, PharmD, BCPS, CPP Clinical Pharmacist Pager: [phone] Phone: [phone] 07/24/2015 10:28 AM    Time with patient: 8 minutes Preparation and documentation time: 2 minutes Total time: 10 minutes

## 2015-07-25 ENCOUNTER — Ambulatory Visit: Payer: Medicare Other | Attending: Family Medicine | Admitting: Rehabilitation

## 2015-07-25 ENCOUNTER — Encounter: Payer: Self-pay | Admitting: Rehabilitation

## 2015-07-25 DIAGNOSIS — R2681 Unsteadiness on feet: Secondary | ICD-10-CM | POA: Insufficient documentation

## 2015-07-25 DIAGNOSIS — R531 Weakness: Secondary | ICD-10-CM | POA: Diagnosis not present

## 2015-07-25 NOTE — Addendum Note (Signed)
Encounter addended by: Sherald Hess, NP on: 07/25/2015 12:26 PM<BR>     Documentation filed: Notes Section

## 2015-07-25 NOTE — Patient Instructions (Addendum)
   From lower seating surface (use your couch).  Perform sit to stand without using your hands x 10 reps.  Sit at front of couch, feet about shoulder width apart.  Perform this 2 times per day.      Clam shells:  Lie on side (make sure that hip is facing ceiling and you are not rotated forward or backwards).  Keep feet together and lift top knee apart from other knee.  Perform x 10 reps, 2 times a day.    Walking Program:    Begin 4-5 days a week (when not in therapy) and walk for 8 mins at a time.  If tolerating well, add one minute each week.

## 2015-07-25 NOTE — Assessment & Plan Note (Signed)
Consistent with adrenal insufficiency from chronic prednisone use for ILD - Avoid Fluorinef d/t history of CHF  Plan: 1. Continue current chronic prednisone taper, has 20mg  tabs - start today with 30mg  (1.5 tabs) x 1 week, then 20mg  x 1 week (1 tab), then 10 mg (half tab) x 1-2 weeks until next apt, will cautiously reduce to 5mg  (since this is when her symptoms started last time) 2. RTC 1 month

## 2015-07-25 NOTE — Assessment & Plan Note (Signed)
Improving, with still some facial tenderness and pressure. Afebrile. No other concerns.  Plan: 1. Finished Augmentin course, anticipate further improvement, may have secondary viral URI infection

## 2015-07-25 NOTE — Assessment & Plan Note (Addendum)
Now worsening CBG control back on high dose prednisone for adrenal insufficiency. Previously with improved control off pred in 03/2015 - Current A1c 8.5 (7/15) - No complications or hypoglycemia - S/p Ophtho DM eye exam (negative for DM retinopathy) - 04/30/15  Plan: 1. Continue 35 units nightly next 2-3 days, if CBG >250 regularly, then increase to 40 units nightly for next 1-2 weeks, max dose up to 45 units daily, anticipate remain elevated CBGs with continued prednisone however gradual taper pred over next few weeks 2. Continue Metformin 1000mg  BID 3. Cont atorvastatin 40mg  4. Continue improved DM diet, exercise 5. RTC 1 mo for A1c

## 2015-07-25 NOTE — Progress Notes (Signed)
Subjective:    Patient ID: Makayla Vasquez, female    DOB: [birth year], 34 y.o.   MRN: [id number]  Makayla Vasquez is a 34 y.o. female presenting on 07/23/2015 for [facility] Follow-up from 07/08/15 to 07/15/15 for hypotension, weakness, diagnosed with adrenal insufficency  ADRENAL INSUFFICIENCY, CHRONIC PREDNISONE USE: - During recent hospitalization dx with hypotension due to adrenal insufficiency, as patient had been on chronic prednisone >1 year ILD and had been tapered off in 03/2015, then resumed in 05/2015 by me for a burst and prolonged low dose until follow-up with Rheumatology, discontinued prednisone. On admit, BP 60-80s/30s-50s received IVF rehydration and steroids with Solucortef, ACTH stim test confirmed adrenal insufficiency (base 7.4, 30 min 13.6, 60 min 15.9), transitioned to PO prednisone 10m daily with maintained BPs, asymptomatic at rest and ambulation, discharged on Prednisone 489mdaily, recommended taper with 30 x 1 week, 20 x 1 week, 10 x 1 week then 5-10 daily depending on symptoms. - Today BP is stable 111/70 without any symptoms, feels "much better" since leaving hospital, continues to take daily Prednisone 4054mx 2 of the 75m76mbs), asking what her taper regimen should be - Patient unsure when next Rheumatology follow-up is  CHRONIC DM, Type 2, chronic steroid induced: Reports concerns for elevated CBGs now back on high dose prednisone CBGs: Avg 200-300s, Low >200, High >400. Checks CBGs nightly Meds: Lantus 35u nightly (titrated up from hospital discharge at 20u daily), Metformin 1000mg15m Reports good compliance. Tolerating well w/o side-effects Currently on ARB Lifestyle: Diet (improved DM diet) / Exercise (no regular, limited by resp status ILD) Denies hypoglycemia, polyuria, visual changes, numbness or tingling.  Chronic Diastolic CHF / Hypokalemia - During hospital stay initial BNP 34, Repeat ECHO with EF 50%, 27%de 2 diastolic dysfxn, repeat BNP 177,  [facility] cardiology team started IV diuresis x 2 days, then transitioned back to Lasix PO daily, with close outpatient follow-up. Apt on 07/24/15. - Continues to take K 20 mEq daily, on discharge K at 4.4  CHRONIC HTN: Hospital discharge report that she was resumed on Bisoprolol and Cozaar and Spironolactone was discontinued. Reports good compliance, took meds today. Tolerating well, w/o complaints. Denies CP, dyspnea, HA, edema, dizziness / lightheadedness  SINUSITIS: - Treated with Augmentin given total 7 day course, with some improvement. Denies any fevers, purulence nasal discharge, but still admits to some facial pain and pressure.   Past Medical History  Diagnosis Date  . Cardiac pacemaker     a. Since age 83 [id number] to BiV in 2013.  . Congenital complete AV block   . Obesity   . GERD (gastroesophageal reflux disease)   . Asymptomatic LV dysfunction     a. Echo in Dec 2011 with EF 35 to 40%. Felt to be due to paced rhythm. b. EF 25-30% in 07/2014.  . Seizures (HCC) [place] as a child- from high fever  . Anxiety   . Bipolar affective disorder (HCC) [place] Depression     bipolar  . Carpal tunnel syndrome of right wrist   . Asthma     seasonal allergies   . Arthritis     rheumatoid arthritis- mild, no rheumatology care   . Hypertension   . Pneumonitis     a. a/w hypoxia - inflammatory - large workup 07/2014.  . SinMarland Kitchens tachycardia (HCC) [place] Diabetes mellitus without complication (HCC) [place] [facility] of permanent cardiac pacemaker   .  CHF (congestive heart failure) ([place])   . Lupus ([place])   . Lupus (systemic lupus erythematosus) ([place])     Social History   Social History  . Marital Status: Single    Spouse Name: N/A  . Number of Children: 1  . Years of Education: N/A   Occupational History  . CNA    Social History Main Topics  . Smoking status: Former Smoker -- 0.25 packs/day for .5 years    Types: Cigarettes    Quit date: 07/25/1996  . Smokeless tobacco: Never  Used  . Alcohol Use: No  . Drug Use: No  . Sexual Activity: Yes    Birth Control/ Protection: Implant, IUD     Comment: placed 04/2011   Other Topics Concern  . Not on file   Social History Narrative   Works at [employer].      Current Outpatient Prescriptions on File Prior to Visit  Medication Sig  . albuterol (PROVENTIL HFA;VENTOLIN HFA) 108 (90 BASE) MCG/ACT inhaler Inhale 2 puffs into the lungs every 4 (four) hours as needed for wheezing or shortness of breath.  [name] atorvastatin (LIPITOR) 40 MG tablet Take 1 tablet (40 mg total) by mouth daily.  . bisoprolol (ZEBETA) 5 MG tablet Take 0.5 tablets (2.5 mg total) by mouth daily.  . Blood Glucose Monitoring Suppl (ONE TOUCH ULTRA 2) W/DEVICE KIT Check blood sugar daily before breakfast  . budesonide-formoterol (SYMBICORT) 80-4.5 MCG/ACT inhaler Inhale 2 puffs into the lungs 2 (two) times daily as needed (for shortness of breath).   . calcium-vitamin D (OSCAL WITH D) 500-200 MG-UNIT per tablet Take 1 tablet by mouth daily with breakfast.   . etonogestrel (NEXPLANON) 68 MG IMPL implant 1 each by Subdermal route once.  . furosemide (LASIX) 20 MG tablet Take 1 tablet (20 mg total) by mouth daily.  [name] glucose blood (ONE TOUCH ULTRA TEST) test strip Check blood sugar daily before breakfast  . Insulin Pen Needle (INSUPEN PEN NEEDLES) 32G X 4 MM MISC Use with insulin pen  . losartan (COZAAR) 25 MG tablet Take 0.5 tablets (12.5 mg total) by mouth daily.  . metFORMIN (GLUCOPHAGE) 1000 MG tablet TAKE 1 TABLET BY MOUTH TWICE A DAY WITH A MEAL  . mycophenolate (CELLCEPT) 500 MG tablet Take 500 mg by mouth 2 (two) times daily.  . Nutritional Supplements (GLUCERNA ADVANCE SHAKE) LIQD Take supplement daily in place of 1 meal.  . oxyCODONE-acetaminophen (PERCOCET) 5-325 MG per tablet Take 1 tablet by mouth every 6 (six) hours as needed for severe pain.  . pantoprazole (PROTONIX) 40 MG tablet Take 1 tablet (40 mg total) by mouth daily.  . predniSONE  (DELTASONE) 20 MG tablet Take 2 tablets (40 mg total) by mouth daily with breakfast.   No current facility-administered medications on file prior to visit.    Review of Systems  Constitutional: Negative for fever, chills, diaphoresis, activity change, appetite change and fatigue.  HENT: Negative for congestion and hearing loss.   Eyes: Negative for visual disturbance.  Respiratory: Negative for cough, chest tightness, shortness of breath and wheezing.   Cardiovascular: Negative for chest pain, palpitations and leg swelling.  Gastrointestinal: Negative for nausea, vomiting, abdominal pain, diarrhea and constipation.  Genitourinary: Negative for dysuria, frequency and hematuria.  Musculoskeletal: Negative for arthralgias and neck pain.  Skin: Negative for rash.  Neurological: Negative for dizziness, weakness, light-headedness, numbness and headaches.  Hematological: Negative for adenopathy.  Psychiatric/Behavioral: Negative for behavioral problems and confusion.   Per HPI unless specifically indicated  above     Objective:    BP 111/70 mmHg  Pulse 103  Temp(Src) 98 F (36.7 C) (Oral)  Ht 5' 5"  (1.651 m)  Wt 183 lb (83.008 kg)  BMI 30.45 kg/m2  SpO2 93%  LMP 07/04/2015  Wt Readings from Last 3 Encounters:  07/24/15 182 lb (82.555 kg)  07/23/15 183 lb (83.008 kg)  07/15/15 191 lb 12.8 oz (87 kg)    Physical Exam  Constitutional: She is oriented to person, place, and time. She appears well-developed and well-nourished. No distress.  Pleasant, cooperative, nasal cannula O2 in place  HENT:  Head: Normocephalic and atraumatic.  Mouth/Throat: Oropharynx is clear and moist.  Frontal sinuses mild +TTP  Eyes: Conjunctivae and EOM are normal. Pupils are equal, round, and reactive to light.  Neck: Normal range of motion. Neck supple. No thyromegaly present.  Cardiovascular: Normal rate, regular rhythm, normal heart sounds and intact distal pulses.   No murmur heard. Pulmonary/Chest:  Effort normal and breath sounds normal. No respiratory distress. She has no wheezes. She has no rales.  Abdominal: Soft. Bowel sounds are normal. She exhibits no distension and no mass. There is no tenderness.  Musculoskeletal: Normal range of motion. She exhibits no edema or tenderness.  Lymphadenopathy:    She has no cervical adenopathy.  Neurological: She is alert and oriented to person, place, and time.  Skin: Skin is warm and dry. No rash noted. She is not diaphoretic.  Psychiatric: She has a normal mood and affect. Her behavior is normal.  Nursing note and vitals reviewed.  Results for orders placed or performed in visit on 39/76/73  BASIC METABOLIC PANEL WITH GFR  Result Value Ref Range   Sodium 145 135 - 146 mmol/L   Potassium 3.8 3.5 - 5.3 mmol/L   Chloride 100 98 - 110 mmol/L   CO2 24 20 - 31 mmol/L   Glucose, Bld 78 65 - 99 mg/dL   BUN 20 7 - 25 mg/dL   Creat 0.82 0.50 - 1.10 mg/dL   Calcium 9.5 8.6 - 10.2 mg/dL   GFR, Est African American >89 >=60 mL/min   GFR, Est Non African American >89 >=60 mL/min      Assessment & Plan:   Problem List Items Addressed This Visit      Cardiovascular and Mediastinum   Chronic diastolic congestive heart failure (HCC)    Chronic diastolic CHF with HFpEF 41% and grd 2 diastolic dysfxn on last ECHO 06/2015. - Recently diuresed in hospital  Plan: 1. Continue home Lasix 3m daily with K dur 20 mEq daily for prior hypoK 2. Follow-up with Cards / AHF 10/4 3. Re-check BMET for K - to 3.8, continue K      Hypertension    Controlled, on Bisoprolol, Cozaar, and Lasix. Discontinued SArlyce Harmanfrom hospitalization. - Concern with prior hypotension (from 2/2 adrenal insufficiency), now improved back on prednisone  Plan: 1. Continue current regimen 2. Continue prednisone taper gradually        Respiratory   ILD (interstitial lung disease) (HCC)   Rhinosinusitis    Improving, with still some facial tenderness and pressure. Afebrile. No  other concerns.  Plan: 1. Finished Augmentin course, anticipate further improvement, may have secondary viral URI infection        Endocrine   Adrenal insufficiency (HCC)    Consistent with adrenal insufficiency from chronic prednisone use for ILD - Avoid Fluorinef d/t history of CHF  Plan: 1. Continue current chronic prednisone taper, has 251mtabs -  start today with 3m (1.5 tabs) x 1 week, then 237mx 1 week (1 tab), then 10 mg (half tab) x 1-2 weeks until next apt, will cautiously reduce to 39m61msince this is when her symptoms started last time) 2. RTC 1 month      Relevant Orders   BASIC METABOLIC PANEL WITH GFR (Completed)   Diabetes mellitus type 2 in obese [facility]    Now worsening CBG control back on high dose prednisone for adrenal insufficiency. Previously with improved control off pred in 03/2015 - Current A1c 8.5 (04/30/38 No complications or hypoglycemia - S/p Ophtho DM eye exam (negative for DM retinopathy) - 04/30/15  Plan: 1. Continue 35 units nightly next 2-3 days, if CBG >250 regularly, then increase to 40 units nightly for next 1-2 weeks, max dose up to 45 units daily, anticipate remain elevated CBGs with continued prednisone however gradual taper pred over next few weeks 2. Continue Metformin 1000m17mD 3. Cont atorvastatin 40mg77mContinue improved DM diet, exercise 5. RTC 1 mo for A1c      Relevant Orders   BASIC METABOLIC PANEL WITH GFR (Completed)     Other   Hyperglycemia    Other Visit Diagnoses    Hypokalemia        K at 4.4 in hospital, re-check BMET today to K 3.8, continue Kdur 20 mEq daily with Lasix 20mg 41my    Relevant Medications    potassium chloride SA (KLOR-CON M20) 20 MEQ tablet    Other Relevant Orders    BASIC METABOLIC PANEL WITH GFR (Completed)       Meds ordered this encounter  Medications  . potassium chloride SA (KLOR-CON M20) 20 MEQ tablet    Sig: Take 1 tablet (20 mEq total) by mouth daily.    Dispense:  30  tablet    Refill:  2      Follow up plan: Return in about 4 weeks (around 08/20/2015) for diabetes.  A total of 25 minutes was spent face-to-face with this patient. Over half this time was spent on counseling patient on the diagnosis and different diagnostic and therapeutic options available.  AlexanNobie [email or web address]

## 2015-07-25 NOTE — Therapy (Signed)
[facility] [address] [place], [place], [address] Phone: [phone]   Fax:  [phone]  Physical Therapy Evaluation  Patient Details  Name: Makayla Vasquez MRN: [id number] Date of Birth: [birth year] Referring Provider:  Tobey Grim, MD  Encounter Date: 07/25/2015      PT End of Session - 07/25/15 1049    Visit Number 1   Number of Visits 7  eval + 6 visits   Date for PT Re-Evaluation 09/23/15   Authorization Type Medicare, G codes required every 10th visits.    PT Start Time 0845   PT Stop Time 0930   PT Time Calculation (min) 45 min   Activity Tolerance Patient tolerated treatment well   Behavior During Therapy WFL for tasks assessed/performed      Past Medical History  Diagnosis Date  . Cardiac pacemaker     a. Since age 70 in 70. b. Upgrade to BiV in 2013.  . Congenital complete AV block   . Obesity   . GERD (gastroesophageal reflux disease)   . Asymptomatic LV dysfunction     a. Echo in Dec 2011 with EF 35 to 40%. Felt to be due to paced rhythm. b. EF 25-30% in 07/2014.  . Seizures (HCC)     as a child- from high fever  . Anxiety   . Bipolar affective disorder (HCC)   . Depression     bipolar  . Carpal tunnel syndrome of right wrist   . Asthma     seasonal allergies   . Arthritis     rheumatoid arthritis- mild, no rheumatology care   . Hypertension   . Pneumonitis     a. a/w hypoxia - inflammatory - large workup 07/2014.  [name] Sinus tachycardia (HCC)   . Diabetes mellitus without complication (HCC)   . Presence of permanent cardiac pacemaker   . CHF (congestive heart failure) (HCC)   . Lupus (HCC)   . Lupus (systemic lupus erythematosus) (HCC)     Past Surgical History  Procedure Laterality Date  . Throat surgery  1994    s/p laser treatment  . Cesarean section    . Insert / replace / remove pacemaker      2001  . Cholecystectomy    . Iud removal  11/03/2011    Procedure:  INTRAUTERINE DEVICE (IUD) REMOVAL;  Surgeon: Myra C. Marice Potter, MD;  Location: [facility];  Service: Gynecology;  Laterality: N/A;  . Cyst excision  12/10/2012    THYROID  . Thyroglossal duct cyst N/A 12/10/2012    Procedure: REVISION OF THYROGLOSSAL DUCT CYST EXCISION;  Surgeon: Serena Colonel, MD;  Location: [facility];  Service: ENT;  Laterality: N/A;  Revision of Thyroglossal Duct Cyst Excision  . Nasal fracture surgery      /w plate   . Carpal tunnel with cubital tunnel Right 07/26/2013    Procedure: RIGHT LIMITED OPEN CARPAL TUNNEL RELEASE ,  RIGHT CUBITAL TUNNEL RELEASE, INSITU VERSES ULNAR NERVE DECOMPRESSION AND ANTERIOR TRANSPOSITION;  Surgeon: Dominica Severin, MD;  Location: [facility] OR;  Service: Orthopedics;  Laterality: Right;  . Video bronchoscopy Bilateral 06/19/2014    Procedure: VIDEO BRONCHOSCOPY WITHOUT FLUORO;  Surgeon: Kalman Shan, MD;  Location: [facility] ENDOSCOPY;  Service: Cardiopulmonary;  Laterality: Bilateral;  . Bi-ventricular pacemaker upgrade N/A 03/08/2012    Procedure: BI-VENTRICULAR PACEMAKER UPGRADE;  Surgeon: Marinus Maw, MD;  Location: [facility] C[facility] LAB;  Service: Cardiovascular;  Laterality: N/A;  . Atrial tach ablation N/A 08/14/2014  Procedure: ATRIAL TACH ABLATION;  Surgeon: Marinus Maw, MD;  Location: [facility] CATH LAB;  Service: Cardiovascular;  Laterality: N/A;  . Right heart catheterization N/A 10/26/2014    Procedure: RIGHT HEART CATH;  Surgeon: Dolores Patty, MD;  Location: [facility] CATH LAB;  Service: Cardiovascular;  Laterality: N/A;    There were no vitals filed for this visit.  Visit Diagnosis:  Generalized weakness - Plan: PT plan of care cert/re-cert  Unsteadiness - Plan: PT plan of care cert/re-cert      Subjective Assessment - 07/25/15 0851    Subjective "My legs get stuck and I can hardly walk."  "They are also very painful."  "I have good days and bad days."  "I have bad days when the weather is bad."    Limitations Sitting;Walking;House hold activities   Currently  in Pain? No/denies            [facility] PT Assessment - 07/25/15 0001    Assessment   Medical Diagnosis weakness, falls   Onset Date/Surgical Date 05/21/15   Precautions   Precautions Fall   Precaution Comments on chronic O2 (4LO2)   Restrictions   Weight Bearing Restrictions No   Balance Screen   Has the patient fallen in the past 6 months No   Has the patient had a decrease in activity level because of a fear of falling?  No   Is the patient reluctant to leave their home because of a fear of falling?  No   Home Environment   Living Environment Private residence   Living Arrangements Spouse/significant other;Children   Available Help at Discharge Family;Available PRN/intermittently   Type of Home Apartment   Home Access Stairs to enter   Entrance Stairs-Number of Steps 1   Entrance Stairs-Rails Right   Home Layout One level   Home Equipment Bedside commode   Prior Function   Level of Independence Independent   Vocation On disability   Cognition   Overall Cognitive Status Within Functional Limits for tasks assessed   Observation/Other Assessments   Focus on Therapeutic Outcomes (FOTO)  ABC 79.4%   Sensation   Light Touch Appears Intact   Proprioception Appears Intact   Coordination   Gross Motor Movements are Fluid and Coordinated Yes   Fine Motor Movements are Fluid and Coordinated Yes  states when lupus flares, hands get stiff   ROM / Strength   AROM / PROM / Strength Strength   Strength   Overall Strength Deficits   Overall Strength Comments B hip flex 2/5, knee flex R 3-/5, L knee flex 4/5, knee ext B 4/5, B ankle DF/PF 4/5, B hip abd 2+/5, B hip add 2+/5   Transfers   Transfers Sit to Stand;Stand to Sit   Sit to Stand 6: Modified independent (Device/Increase time)   Five time sit to stand comments  20.41 secs   Stand to Sit 6: Modified independent (Device/Increase time)   Ambulation/Gait   Ambulation/Gait Yes   Ambulation/Gait Assistance 6: Modified  independent (Device/Increase time)   Ambulation Distance (Feet) 793 Feet  during and another 115'   Assistive device None   Gait Pattern Decreased arm swing - right;Decreased arm swing - left;Decreased stride length;Trunk flexed   Ambulation Surface Level;Indoor   Gait velocity 1.91 ft/sec   Stairs Yes   Stairs Assistance 6: Modified independent (Device/Increase time)   Stair Management Technique Two rails;Step to pattern;Forwards   Number of Stairs 4   Height of Stairs 6   6  Minute Walk- Baseline   6 Minute Walk- Baseline yes   HR (bpm) 77   02 Sat (%RA) 92 %  on 4LO2   6 Minute walk- Post Test   6 Minute Walk Post Test yes   HR (bpm) 98   02 Sat (%RA) 95 %  on 4LO2   6 minute walk test results    Aerobic Endurance Distance Walked 793   Endurance additional comments No AD, no LOB   High Level Balance   High Level Balance Activites Head turns   High Level Balance Comments Performed short distance gait with head turns side/side and up/down.  Note no LOB or staggering,  but pt maintains slow gait speed throughout.                            PT Education - 07/25/15 1048    Education provided Yes   Education Details provided education on evaulation findings, HEP   Person(s) Educated Patient   Methods Explanation;Demonstration;Handout   Comprehension Verbalized understanding;Returned demonstration          PT Short Term Goals - 07/25/15 1200    PT SHORT TERM GOAL #1   Title Pt will be independent with initial HEP to indicate functional improvement in strength.  (Target Date: 08/15/15)   Baseline dependent    Time 3   Period Weeks   PT SHORT TERM GOAL #2   Title Will perform FGA to assess functional balance.  (Target date: 08/15/15)   Baseline not assessed   Time 3   Period Weeks           PT Long Term Goals - 07/25/15 1202    PT LONG TERM GOAL #1   Title Pt will be independent with final HEP to indicate compliance with HEP for functional  improvement in strength.  (Target Date: 09/05/15)   Baseline dependent   Time 6   Period Weeks   PT LONG TERM GOAL #2   Title Pt will increase gait speed to 2.51 ft/sec to indicate more efficient gait and decreased fall risk.  (Target Date: 09/05/15)   Baseline 1.91 ft/sec on 07/25/15   Time 6   Period Weeks   PT LONG TERM GOAL #3   Title Pt will increase distance by 150' in order to indicate functional improvement in endurance. (Target Date: 09/05/15)   Baseline 793' on 07/25/15   Time 6   Period Weeks   PT LONG TERM GOAL #4   Title Pt will ambulate >500' on uneven outdoor surfaces without AD at mod I level without LOB to indicate safe negotiation in community.  (Target Date: 09/05/15)   Baseline Not assessed at this time   Time 6   Period Weeks   PT LONG TERM GOAL #5   Title Pt will climb 8 stairs with single rail in alternating pattern to indicate improvement in functional LE strength.  (Target Date: 09/05/15)               Plan - 07/25/15 1050    Clinical Impression Statement Pt presents with new diagnosis of Lupus approx 3 months ago and was recently hospitalized 9/18-9/25 for SOB, hypotension due to adrenal insufficiency.  Presents with generalized weakness and endurance deficits.  Upon PT eval, note gait speed is 1.91 ft/sec indicative of increased fall risk, 5 time sit to stand time of 20.41, indicative of fall risk and decreased functional LE strength and distance  of 80' which is well below any known cut off for even community dwelling eldery population (none available for her age).  Based on these deficits, pt will benefit from OP neuro PT to address deficits.     Pt will benefit from skilled therapeutic intervention in order to improve on the following deficits Abnormal gait;Decreased activity tolerance;Decreased balance;Decreased endurance;Decreased mobility;Decreased strength;Difficulty walking;Impaired perceived functional ability;Postural dysfunction;Pain    Rehab Potential Excellent   Clinical Impairments Affecting Rehab Potential co-morbidities   PT Frequency 1x / week   PT Duration 6 weeks   PT Next Visit Plan Assess compliance with walking program and HEP given at eval.  Add strengthening exercises as needed, perform FGA to assess balance.    PT Home Exercise Plan see pt instruction   Consulted and Agree with Plan of Care Patient          G-Codes - 07-28-15 07-Feb-1207    Functional Assessment Tool Used 6 MWT 793', gait speed 1.91 ft/sec   Functional Limitation Mobility: Walking and moving around   Mobility: Walking and Moving Around Current Status [phone]) At least 60 percent but less than 80 percent impaired, limited or restricted   Mobility: Walking and Moving Around Goal Status [phone]) At least 60 percent but less than 80 percent impaired, limited or restricted       Problem List Patient Active Problem List   Diagnosis Date Noted  . Adrenal insufficiency (HCC)   . Acute on chronic diastolic CHF (congestive heart failure) (HCC)   . Arterial hypotension   . SOB (shortness of breath)   . Lupus (systemic lupus erythematosus) (HCC)   . Congenital heart block   . Chronic diastolic congestive heart failure (HCC)   . Hypotension 07/08/2015  . Fatigue 06/05/2015  . Nausea with vomiting 06/05/2015  . Hyperlipidemia associated with type 2 diabetes mellitus (HCC) 05/04/2015  . Polyarthralgia 04/26/2015  . Rhinosinusitis 04/19/2015  . Screening for cervical cancer 01/19/2015  . Onychomycosis of right great toe 12/14/2014  . Discoloration of skin 12/01/2014  . Lip abscess 11/02/2014  . CHF (congestive heart failure) (HCC)   . Diabetes mellitus type 2 in obese (HCC)   . Hyperglycemia 10/24/2014  . Dehydration   . Sinus tachycardia (HCC)   . Chronic respiratory failure with hypoxia (HCC) 08/07/2014  . Atrial tachycardia (HCC) 07/26/2014  . Insertion of implantable subdermal contraceptive 07/12/2014  . Acute respiratory failure with  hypoxia (HCC) 06/19/2014  . ILD (interstitial lung disease) (HCC) 06/19/2014  . Pulmonary infiltrates 06/17/2014  . Dyspnea 06/16/2014  . Hypoxemia 06/16/2014  . Congestive dilated cardiomyopathy (HCC) 05/31/2014  . Nonallergic rhinitis 05/22/2014  . Vaginal itching 05/22/2014  . Pneumonia 05/09/2014  . Shortness of breath 05/08/2014  . Sterilization consult 04/20/2014  . Breast tenderness 01/18/2014  . Carpal tunnel syndrome 09/10/2012  . Constipation 03/03/2012  . Chronic systolic heart failure (HCC) 02/26/2012  . Chest pain, unspecified 12/29/2011  . Post-nasal drip 10/24/2011  . Contraception 01/29/2011  . Heart palpitations   . PPM-Medtronic   . Congenital complete AV block   . Bipolar affective disorder (HCC)   . Obesity   . GERD (gastroesophageal reflux disease)   . Hypertension 01/05/2011  . THYROGLOSSAL DUCT CYST 07/18/2010  . UNSPECIFIED CARDIAC DYSRHYTHMIA 05/07/2010  . RH FACTOR, NEGATIVE 04/08/2010  . Mild intermittent asthma without complication 01/11/2009  . 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 HEART BLOCK 12/17/2006  . IRRITABLE BOWEL SYNDROME 12/17/2006    Harriet Butte, PT, MPT [facility] [address] [place], [place], [address] Phone: [phone]   Fax:  [phone] 07/25/2015, 12:20 PM

## 2015-07-25 NOTE — Assessment & Plan Note (Addendum)
Chronic diastolic CHF with HFpEF 50% and grd 2 diastolic dysfxn on last ECHO 06/2015. - Recently diuresed in hospital  Plan: 1. Continue home Lasix 20mg  daily with K dur 20 mEq daily for prior hypoK 2. Follow-up with Cards / AHF 10/4 3. Re-check BMET for K - to 3.8, continue K

## 2015-07-25 NOTE — Assessment & Plan Note (Signed)
Controlled, on Bisoprolol, Cozaar, and Lasix. Discontinued Cleda Daub from hospitalization. - Concern with prior hypotension (from 2/2 adrenal insufficiency), now improved back on prednisone  Plan: 1. Continue current regimen 2. Continue prednisone taper gradually

## 2015-07-27 ENCOUNTER — Other Ambulatory Visit: Payer: Self-pay | Admitting: Internal Medicine

## 2015-08-01 ENCOUNTER — Other Ambulatory Visit (HOSPITAL_COMMUNITY): Payer: Self-pay | Admitting: *Deleted

## 2015-08-01 ENCOUNTER — Ambulatory Visit: Payer: Medicare Other | Admitting: Physical Therapy

## 2015-08-08 ENCOUNTER — Encounter: Payer: Self-pay | Admitting: Physical Therapy

## 2015-08-08 ENCOUNTER — Ambulatory Visit: Payer: Medicare Other | Admitting: Physical Therapy

## 2015-08-08 DIAGNOSIS — R531 Weakness: Secondary | ICD-10-CM | POA: Diagnosis not present

## 2015-08-08 DIAGNOSIS — R2681 Unsteadiness on feet: Secondary | ICD-10-CM

## 2015-08-08 NOTE — Patient Instructions (Signed)
Bridging    Slowly raise buttocks from floor, keeping stomach tight. Hold for 5 seconds. Repeat _10_ times per set. Do __1__ sets per session. Do _1-2_ sessions per day.  http://orth.exer.us/1096   Copyright  VHI. All rights reserved.  Straight Leg Raise    Tighten stomach and slowly raise locked right leg _2-3_ inches from surface. Hold for 3-5 seconds. Lower down slowly.  Repeat with other leg. Repeat 10__ times each leg. Do _1-2_sessions per day.  http://orth.exer.us/1102   Copyright  VHI. All rights reserved.  Hip Flexor Stretch    Lying on back near edge of bed, bend legs up. With leg closest to edge of bed/sofa, lower foot to floor and then back up. Keep knee bent. 10 reps each leg. 1-2 times a day.  http://gt2.exer.us/347   Copyright  VHI. All rights reserved.  Strengthening: Hip Abductor - Resisted    With band looped around both legs above knees, push thighs apart. Hold for 5 seconds.  Repeat _10_ times per set. Do 1 sets per session. Do _1-2__sessions per day.  http://orth.exer.us/688   Copyright  VHI. All rights reserved.   Side-Stepping    Walk to left side with eyes open. Take even steps, leading with same foot. Make sure each foot lifts off the floor. Repeat in opposite direction. Repeat for 3 laps toward each side. Do __1-2__ sessions per day.  Copyright  VHI. All rights reserved.  "I love a Licensed conveyancer    At counter top for balance: high knee marching forward and then backwards. Pause with each knee lift for 3-5 seconds. Repeat for 3 laps each way. 1-2 times a day.  http://gt2.exer.us/345   Copyright  VHI. All rights reserved.  Walking on Heels    At counter top: Walk on heels forward and then backwards while continuing on a straight path. Perform 3 laps each way. Do _1-2_ sessions per day.  Copyright  VHI. All rights reserved.  Walking on Toes    At counter for balance: Walk on toes forward and then backwards while  continuing on a straight path. Perform 3 laps each way.  Do _1-2_ sessions per day.  Copyright  VHI. All rights reserved.

## 2015-08-08 NOTE — Therapy (Signed)
[facility] [address] [place], [place], [address] Phone: [phone]   Fax:  [phone]  Physical Therapy Treatment  Patient Details  Name: Makayla Vasquez MRN: [id number] Date of Birth: May 13, 1981 No Data Recorded  Encounter Date: 08/08/2015      PT End of Session - 08/08/15 5093    Visit Number 2   Number of Visits 7  eval + 6 visits   Date for PT Re-Evaluation 09/23/15   Authorization Type Medicare, G codes required every 10th visits.    PT Start Time 0848   PT Stop Time 0930   PT Time Calculation (min) 42 min   Activity Tolerance Patient tolerated treatment well   Behavior During Therapy WFL for tasks assessed/performed      Past Medical History  Diagnosis Date  . Cardiac pacemaker     a. Since age 39 in 21. b. Upgrade to BiV in 2013.  . Congenital complete AV block   . Obesity   . GERD (gastroesophageal reflux disease)   . Asymptomatic LV dysfunction     a. Echo in Dec 2011 with EF 35 to 40%. Felt to be due to paced rhythm. b. EF 25-30% in 07/2014.  . Seizures (HCC)     as a child- from high fever  . Anxiety   . Bipolar affective disorder (HCC)   . Depression     bipolar  . Carpal tunnel syndrome of right wrist   . Asthma     seasonal allergies   . Arthritis     rheumatoid arthritis- mild, no rheumatology care   . Hypertension   . Pneumonitis     a. a/w hypoxia - inflammatory - large workup 07/2014.  [name] Sinus tachycardia (HCC)   . Diabetes mellitus without complication (HCC)   . Presence of permanent cardiac pacemaker   . CHF (congestive heart failure) (HCC)   . Lupus (HCC)   . Lupus (systemic lupus erythematosus) (HCC)     Past Surgical History  Procedure Laterality Date  . Throat surgery  1994    s/p laser treatment  . Cesarean section    . Insert / replace / remove pacemaker      2001  . Cholecystectomy    . Iud removal  11/03/2011    Procedure: INTRAUTERINE DEVICE (IUD)  REMOVAL;  Surgeon: Myra C. Marice Potter, MD;  Location: [facility] OR[facility];  Service: Gynecology;  Laterality: N/A;  . Cyst excision  12/10/2012    THYROID  . Thyroglossal duct cyst N/A 12/10/2012    Procedure: REVISION OF THYROGLOSSAL DUCT CYST EXCISION;  Surgeon: Serena Colonel, MD;  Location: [facility] OR;  Service: ENT;  Laterality: N/A;  Revision of Thyroglossal Duct Cyst Excision  . Nasal fracture surgery      /w plate   . Carpal tunnel with cubital tunnel Right 07/26/2013    Procedure: RIGHT LIMITED OPEN CARPAL TUNNEL RELEASE ,  RIGHT CUBITAL TUNNEL RELEASE, INSITU VERSES ULNAR NERVE DECOMPRESSION AND ANTERIOR TRANSPOSITION;  Surgeon: Dominica Severin, MD;  Location: [facility] OR;  Service: Orthopedics;  Laterality: Right;  . Video bronchoscopy Bilateral 06/19/2014    Procedure: VIDEO BRONCHOSCOPY WITHOUT FLUORO;  Surgeon: Kalman Shan, MD;  Location: [facility] ENDOSCOPY;  Service: Cardiopulmonary;  Laterality: Bilateral;  . Bi-ventricular pacemaker upgrade N/A 03/08/2012    Procedure: BI-VENTRICULAR PACEMAKER UPGRADE;  Surgeon: Marinus Maw, MD;  Location: [facility] CATH LAB;  Service: Cardiovascular;  Laterality: N/A;  . Atrial tach ablation N/A 08/14/2014    Procedure: ATRIAL  TACH ABLATION;  Surgeon: Marinus Maw, MD;  Location: [facility] CATH LAB;  Service: Cardiovascular;  Laterality: N/A;  . Right heart catheterization N/A 10/26/2014    Procedure: RIGHT HEART CATH;  Surgeon: Dolores Patty, MD;  Location: [facility] CATH LAB;  Service: Cardiovascular;  Laterality: N/A;    There were no vitals filed for this visit.  Visit Diagnosis:  Generalized weakness  Unsteadiness      Subjective Assessment - 08/08/15 0851    Subjective No new complaints. No falls since eval. Still with some right hip/leg pain.   Currently in Pain? Yes   Pain Score 5    Pain Location Hip   Pain Orientation Right   Pain Descriptors / Indicators Aching   Pain Type Chronic pain   Pain Onset More than a month ago   Pain Frequency Intermittent   Aggravating  Factors  unknown   Pain Relieving Factors rest, percocet            [facility] PT Assessment - 08/08/15 0853    Functional Gait  Assessment   Gait assessed  Yes   Gait Level Surface Walks 20 ft in less than 7 sec but greater than 5.5 sec, uses assistive device, slower speed, mild gait deviations, or deviates 6-10 in outside of the 12 in walkway width.   Change in Gait Speed Able to smoothly change walking speed without loss of balance or gait deviation. Deviate no more than 6 in outside of the 12 in walkway width.   Gait with Horizontal Head Turns Performs head turns smoothly with slight change in gait velocity (eg, minor disruption to smooth gait path), deviates 6-10 in outside 12 in walkway width, or uses an assistive device.   Gait with Vertical Head Turns Performs task with slight change in gait velocity (eg, minor disruption to smooth gait path), deviates 6 - 10 in outside 12 in walkway width or uses assistive device   Gait and Pivot Turn Pivot turns safely within 3 sec and stops quickly with no loss of balance.   Step Over Obstacle Is able to step over one shoe box (4.5 in total height) but must slow down and adjust steps to clear box safely. May require verbal cueing.   Gait with Narrow Base of Support Ambulates 7-9 steps.   Gait with Eyes Closed Walks 20 ft, uses assistive device, slower speed, mild gait deviations, deviates 6-10 in outside 12 in walkway width. Ambulates 20 ft in less than 9 sec but greater than 7 sec.   Ambulating Backwards Walks 20 ft, uses assistive device, slower speed, mild gait deviations, deviates 6-10 in outside 12 in walkway width.   Steps Two feet to a stair, must use rail.   Total Score 20     Treatment cont: Performed and issued exercises for strengthening and balance, refer to pt instructions for full details.         PT Education - 08/09/15 1258    Education provided Yes   Education Details Issued HEP today   Person(s) Educated Patient   Methods  Explanation;Demonstration;Handout   Comprehension Verbalized understanding;Returned demonstration          PT Short Term Goals - 08/09/15 1306    PT SHORT TERM GOAL #1   Title Pt will be independent with initial HEP to indicate functional improvement in strength.  (Target Date: 08/15/15)   Baseline dependent    Time 3   Period Weeks   PT SHORT TERM GOAL #2  Title Will perform FGA to assess functional balance.  (Target date: 08/15/15)   Baseline performed on 08/08/15:   Time 3   Period Weeks   Status Achieved           PT Long Term Goals - 07/25/15 1202    PT LONG TERM GOAL #1   Title Pt will be independent with final HEP to indicate compliance with HEP for functional improvement in strength.  (Target Date: 09/05/15)   Baseline dependent   Time 6   Period Weeks   PT LONG TERM GOAL #2   Title Pt will increase gait speed to 2.51 ft/sec to indicate more efficient gait and decreased fall risk.  (Target Date: 09/05/15)   Baseline 1.91 ft/sec on 07/25/15   Time 6   Period Weeks   PT LONG TERM GOAL #3   Title Pt will increase distance by 150' in order to indicate functional improvement in endurance. (Target Date: 09/05/15)   Baseline 793' on 07/25/15   Time 6   Period Weeks   PT LONG TERM GOAL #4   Title Pt will ambulate >500' on uneven outdoor surfaces without AD at mod I level without LOB to indicate safe negotiation in community.  (Target Date: 09/05/15)   Baseline Not assessed at this time   Time 6   Period Weeks   PT LONG TERM GOAL #5   Title Pt will climb 8 stairs with single rail in alternating pattern to indicate improvement in functional LE strength.  (Target Date: 09/05/15)            Plan - 08/08/15 4356    Clinical Impression Statement Functional gait assessment performed with pt scoring in the moderal fall risk area. Pt able to return demo of exericses issued last visit without issues. Added strengthening and balance exercises today with no issues  reported. Pt making steady progress toward goals.   Pt will benefit from skilled therapeutic intervention in order to improve on the following deficits Abnormal gait;Decreased activity tolerance;Decreased balance;Decreased endurance;Decreased mobility;Decreased strength;Difficulty walking;Impaired perceived functional ability;Postural dysfunction;Pain   Rehab Potential Excellent   Clinical Impairments Affecting Rehab Potential co-morbidities   PT Frequency 1x / week   PT Duration 6 weeks   PT Next Visit Plan Continue to work on balance and strengthening toward goals.   PT Home Exercise Plan see pt instruction   Consulted and Agree with Plan of Care Patient        Problem List Patient Active Problem List   Diagnosis Date Noted  . Adrenal insufficiency (HCC)   . Acute on chronic diastolic CHF (congestive heart failure) (HCC)   . Arterial hypotension   . SOB (shortness of breath)   . Lupus (systemic lupus erythematosus) (HCC)   . Congenital heart block   . Chronic diastolic congestive heart failure (HCC)   . Hypotension 07/08/2015  . Fatigue 06/05/2015  . Nausea with vomiting 06/05/2015  . Hyperlipidemia associated with type 2 diabetes mellitus (HCC) 05/04/2015  . Polyarthralgia 04/26/2015  . Rhinosinusitis 04/19/2015  . Screening for cervical cancer 01/19/2015  . Onychomycosis of right great toe 12/14/2014  . Discoloration of skin 12/01/2014  . Lip abscess 11/02/2014  . CHF (congestive heart failure) (HCC)   . Diabetes mellitus type 2 in obese (HCC)   . Hyperglycemia 10/24/2014  . Dehydration   . Sinus tachycardia (HCC)   . Chronic respiratory failure with hypoxia (HCC) 08/07/2014  . Atrial tachycardia (HCC) 07/26/2014  . Insertion of implantable subdermal contraceptive  07/12/2014  . Acute respiratory failure with hypoxia (HCC) 06/19/2014  . ILD (interstitial lung disease) (HCC) 06/19/2014  . Pulmonary infiltrates 06/17/2014  . Dyspnea 06/16/2014  . Hypoxemia 06/16/2014   . Congestive dilated cardiomyopathy (HCC) 05/31/2014  . Nonallergic rhinitis 05/22/2014  . Vaginal itching 05/22/2014  . Pneumonia 05/09/2014  . Shortness of breath 05/08/2014  . Sterilization consult 04/20/2014  . Breast tenderness 01/18/2014  . Carpal tunnel syndrome 09/10/2012  . Constipation 03/03/2012  . Chronic systolic heart failure (HCC) 02/26/2012  . Chest pain, unspecified 12/29/2011  . Post-nasal drip 10/24/2011  . Contraception 01/29/2011  . Heart palpitations   . PPM-Medtronic   . Congenital complete AV block   . Bipolar affective disorder (HCC)   . Obesity   . GERD (gastroesophageal reflux disease)   . Hypertension 01/05/2011  . THYROGLOSSAL DUCT CYST 07/18/2010  . UNSPECIFIED CARDIAC DYSRHYTHMIA 05/07/2010  . RH FACTOR, NEGATIVE 04/08/2010  . Mild intermittent asthma without complication 01/11/2009  . 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 HEART BLOCK 12/17/2006  . IRRITABLE BOWEL SYNDROME 12/17/2006    Sallyanne Kuster 08/09/2015, 1:08 PM  Sallyanne Kuster, PTA, [facility] Outpatient Neuro [facility] [address], [address] [place], [place] [address] [phone] 08/09/2015, 1:08 PM  Name: VICKKI IGOU MRN: [id number] Date of Birth: 09-07-1981

## 2015-08-15 ENCOUNTER — Ambulatory Visit: Payer: Medicare Other | Admitting: Physical Therapy

## 2015-08-16 DIAGNOSIS — Z79899 Other long term (current) drug therapy: Secondary | ICD-10-CM | POA: Diagnosis not present

## 2015-08-16 DIAGNOSIS — J849 Interstitial pulmonary disease, unspecified: Secondary | ICD-10-CM | POA: Diagnosis not present

## 2015-08-16 DIAGNOSIS — R21 Rash and other nonspecific skin eruption: Secondary | ICD-10-CM | POA: Diagnosis not present

## 2015-08-16 DIAGNOSIS — M351 Other overlap syndromes: Secondary | ICD-10-CM | POA: Diagnosis not present

## 2015-08-17 ENCOUNTER — Encounter: Payer: Self-pay | Admitting: Physical Therapy

## 2015-08-17 ENCOUNTER — Ambulatory Visit: Payer: Medicare Other | Admitting: Physical Therapy

## 2015-08-17 DIAGNOSIS — R531 Weakness: Secondary | ICD-10-CM

## 2015-08-17 DIAGNOSIS — R2681 Unsteadiness on feet: Secondary | ICD-10-CM | POA: Diagnosis not present

## 2015-08-17 NOTE — Therapy (Signed)
[facility] [address] [place], [place], [address] Phone: [phone]   Fax:  [phone]  Physical Therapy Treatment  Patient Details  Name: Makayla Vasquez MRN: [id number] Date of Birth: 07-16-1981 No Data Recorded  Encounter Date: 08/17/2015      PT End of Session - 08/17/15 1034    Visit Number 3   Number of Visits 8  eval + 6 visits   Date for PT Re-Evaluation 09/23/15   Authorization Type Medicare, G codes required every 10th visits.    PT Start Time 0930   PT Stop Time 1015   PT Time Calculation (min) 45 min   Equipment Utilized During Treatment Gait belt   Activity Tolerance Patient tolerated treatment well   Behavior During Therapy WFL for tasks assessed/performed      Past Medical History  Diagnosis Date  . Cardiac pacemaker     a. Since age 86 in 81. b. Upgrade to BiV in 2013.  . Congenital complete AV block   . Obesity   . GERD (gastroesophageal reflux disease)   . Asymptomatic LV dysfunction     a. Echo in Dec 2011 with EF 35 to 40%. Felt to be due to paced rhythm. b. EF 25-30% in 07/2014.  . Seizures (HCC)     as a child- from high fever  . Anxiety   . Bipolar affective disorder (HCC)   . Depression     bipolar  . Carpal tunnel syndrome of right wrist   . Asthma     seasonal allergies   . Arthritis     rheumatoid arthritis- mild, no rheumatology care   . Hypertension   . Pneumonitis     a. a/w hypoxia - inflammatory - large workup 07/2014.  [name] Sinus tachycardia (HCC)   . Diabetes mellitus without complication (HCC)   . Presence of permanent cardiac pacemaker   . CHF (congestive heart failure) (HCC)   . Lupus (HCC)   . Lupus (systemic lupus erythematosus) (HCC)     Past Surgical History  Procedure Laterality Date  . Throat surgery  1994    s/p laser treatment  . Cesarean section    . Insert / replace / remove pacemaker      2001  . Cholecystectomy    . Iud removal   11/03/2011    Procedure: INTRAUTERINE DEVICE (IUD) REMOVAL;  Surgeon: Myra C. Marice Potter, MD;  Location: [facility] ORS;  Service: Gynecology;  Laterality: N/A;  . Cyst excision  12/10/2012    THYROID  . Thyroglossal duct cyst N/A 12/10/2012    Procedure: REVISION OF THYROGLOSSAL DUCT CYST EXCISION;  Surgeon: Serena Colonel, MD;  Location: [facility] OR;  Service: ENT;  Laterality: N/A;  Revision of Thyroglossal Duct Cyst Excision  . Nasal fracture surgery      /w plate   . Carpal tunnel with cubital tunnel Right 07/26/2013    Procedure: RIGHT LIMITED OPEN CARPAL TUNNEL RELEASE ,  RIGHT CUBITAL TUNNEL RELEASE, INSITU VERSES ULNAR NERVE DECOMPRESSION AND ANTERIOR TRANSPOSITION;  Surgeon: Dominica Severin, MD;  Location: [facility] OR;  Service: Orthopedics;  Laterality: Right;  . Video bronchoscopy Bilateral 06/19/2014    Procedure: VIDEO BRONCHOSCOPY WITHOUT FLUORO;  Surgeon: Kalman Shan, MD;  Location: [facility] ENDOSCOPY;  Service: Cardiopulmonary;  Laterality: Bilateral;  . Bi-ventricular pacemaker upgrade N/A 03/08/2012    Procedure: BI-VENTRICULAR PACEMAKER UPGRADE;  Surgeon: Marinus Maw, MD;  Location: [facility] CATH LAB;  Service: Cardiovascular;  Laterality: N/A;  . Atrial tach  ablation N/A 08/14/2014    Procedure: ATRIAL TACH ABLATION;  Surgeon: Marinus Maw, MD;  Location: [facility] CATH LAB;  Service: Cardiovascular;  Laterality: N/A;  . Right heart catheterization N/A 10/26/2014    Procedure: RIGHT HEART CATH;  Surgeon: Dolores Patty, MD;  Location: [facility] CATH LAB;  Service: Cardiovascular;  Laterality: N/A;    There were no vitals filed for this visit.  Visit Diagnosis:  Generalized weakness  Unsteadiness      Subjective Assessment - 08/17/15 1027    Subjective No new complaints. No falls since eval. Still with some right hip/leg pain. "I walk outside 2 to 3 times per day, but I don't go far." Pt.'s pain 5/10, and did not increase during the treatment.   Limitations Sitting;Walking;House hold activities   Currently in Pain?  Yes   Pain Score 5    Pain Location Hip   Pain Orientation Right   Pain Descriptors / Indicators Aching   Pain Type Chronic pain   Pain Onset More than a month ago   Pain Relieving Factors rest, percoset           PT Education - 08/17/15 1031    Education Details Pt. was educated on the benefits of strengthening the muscles around her painful joint to decrease pain and increase function. Pt. also educated on benefits of daily walking with varying speed, and increasing heart rate to increase endurance for ADL's.   Person(s) Educated Patient   Methods Explanation   Comprehension Verbalized understanding     Therapeutic exercise: To increase strength, endurance and balance while decreasing pain. Verbal cues for BOS. Pt. did seem fatigued, but assured therapist that she was ok. Multiple rest breaks were deferred by pt.  Walking: Pt. completed a total of 1150 ft. Inside on level surface with mod. I assist. These feet were not consecutive, but were done in laps of 1 or 2 in between sets of LE exercises. Pt. also completed an additional 1089ft. of walking outside on paved surfaces with 5 intervals of increased speed to increase endurance. LOB X1 needing min assist assist to correc  Bridges on mat: 1 set of 20 reps.  Sit to stand on 14 inch block with AirX on top: 3 sets: 12 reps, 12 reps, 8 reps.  Step ups on curb: 3 sets each leg: 10 reps, 10 reps, 5 reps.  Parallel bars: tandem walk on narrow blue foam: 4 laps without hands, eyes open. To increase strength of LE stabilizer muscles and for increased balance.  Toe taps on first 3 steps: 1 set each leg, up and down 20 reps each leg with no rest between reps.          PT Short Term Goals - 08/17/15 1040    PT SHORT TERM GOAL #1   Title Pt will be independent with initial HEP to indicate functional improvement in strength.  (Target Date: 08/15/15)   Baseline dependent    Time 3   Period Weeks   PT SHORT TERM GOAL #2   Title  Will perform FGA to assess functional balance.  (Target date: 08/15/15)   Baseline performed on 08/08/15:   Time 3   Period Weeks   Status Achieved           PT Long Term Goals - 08/17/15 1040    PT LONG TERM GOAL #1   Title Pt will be independent with final HEP to indicate compliance with HEP for functional improvement in strength.  (Target  Date: 09/05/15)   Baseline dependent   Time 6   Period Weeks   PT LONG TERM GOAL #2   Title Pt will increase gait speed to 2.51 ft/sec to indicate more efficient gait and decreased fall risk.  (Target Date: 09/05/15)   Baseline 1.91 ft/sec on 07/25/15   Time 6   Period Weeks   PT LONG TERM GOAL #3   Title Pt will increase distance by 150' in order to indicate functional improvement in endurance. (Target Date: 09/05/15)   Baseline 793' on 07/25/15   Time 6   Period Weeks   PT LONG TERM GOAL #4   Title Pt will ambulate >500' on uneven outdoor surfaces without AD at mod I level without LOB to indicate safe negotiation in community.  (Target Date: 09/05/15)   Baseline Not assessed at this time   Time 6   Period Weeks   PT LONG TERM GOAL #5   Title Pt will climb 8 stairs with single rail in alternating pattern to indicate improvement in functional LE strength.  (Target Date: 09/05/15)           Plan - 08/17/15 1035    Clinical Impression Statement Pt. is progressing towards all goals. Pt.'s endurance is increasing. Pt. walked over 2000 ft. during treatment with only 2 rest breaks. Pt. did check O2 tank level during treatment and it was working properly/full.   Pt will benefit from skilled therapeutic intervention in order to improve on the following deficits Abnormal gait;Decreased activity tolerance;Decreased balance;Decreased endurance;Decreased mobility;Decreased strength;Difficulty walking;Impaired perceived functional ability;Postural dysfunction;Pain   Rehab Potential Excellent   Clinical Impairments Affecting Rehab Potential  co-morbidities   PT Frequency 1x / week   PT Duration 6 weeks   PT Next Visit Plan Continue to work on balance and strengthening toward goals.   PT Home Exercise Plan see pt instruction   Consulted and Agree with Plan of Care Patient        Problem List Patient Active Problem List   Diagnosis Date Noted  . Adrenal insufficiency (HCC)   . Acute on chronic diastolic CHF (congestive heart failure) (HCC)   . Arterial hypotension   . SOB (shortness of breath)   . Lupus (systemic lupus erythematosus) (HCC)   . Congenital heart block   . Chronic diastolic congestive heart failure (HCC)   . Hypotension 07/08/2015  . Fatigue 06/05/2015  . Nausea with vomiting 06/05/2015  . Hyperlipidemia associated with type 2 diabetes mellitus (HCC) 05/04/2015  . Polyarthralgia 04/26/2015  . Rhinosinusitis 04/19/2015  . Screening for cervical cancer 01/19/2015  . Onychomycosis of right great toe 12/14/2014  . Discoloration of skin 12/01/2014  . Lip abscess 11/02/2014  . CHF (congestive heart failure) (HCC)   . Diabetes mellitus type 2 in obese (HCC)   . Hyperglycemia 10/24/2014  . Dehydration   . Sinus tachycardia (HCC)   . Chronic respiratory failure with hypoxia (HCC) 08/07/2014  . Atrial tachycardia (HCC) 07/26/2014  . Insertion of implantable subdermal contraceptive 07/12/2014  . Acute respiratory failure with hypoxia (HCC) 06/19/2014  . ILD (interstitial lung disease) (HCC) 06/19/2014  . Pulmonary infiltrates 06/17/2014  . Dyspnea 06/16/2014  . Hypoxemia 06/16/2014  . Congestive dilated cardiomyopathy (HCC) 05/31/2014  . Nonallergic rhinitis 05/22/2014  . Vaginal itching 05/22/2014  . Pneumonia 05/09/2014  . Shortness of breath 05/08/2014  . Sterilization consult 04/20/2014  . Breast tenderness 01/18/2014  . Carpal tunnel syndrome 09/10/2012  . Constipation 03/03/2012  . Chronic systolic heart failure (  HCC) 02/26/2012  . Chest pain, unspecified 12/29/2011  . Post-nasal drip  10/24/2011  . Contraception 01/29/2011  . Heart palpitations   . PPM-Medtronic   . Congenital complete AV block   . Bipolar affective disorder (HCC)   . Obesity   . GERD (gastroesophageal reflux disease)   . Hypertension 01/05/2011  . THYROGLOSSAL DUCT CYST 07/18/2010  . UNSPECIFIED CARDIAC DYSRHYTHMIA 05/07/2010  . RH FACTOR, NEGATIVE 04/08/2010  . Mild intermittent asthma without complication 01/11/2009  . 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 HEART BLOCK 12/17/2006  . IRRITABLE BOWEL SYNDROME 12/17/2006    Haze Justin 08/17/2015, 10:42 AM  Haze Justin, SPTA  Name: SORANGEL LOPRESTI MRN: [id number] Date of Birth: Apr 03, 1981  This note has been reviewed and edited by supervision CI.  Sallyanne Kuster, PTA, [facility] Outpatient Neuro [facility] [address], [address] [place], [place] [address] [phone] 08/18/2015, 10:57 PM

## 2015-08-20 ENCOUNTER — Encounter: Payer: Self-pay | Admitting: Internal Medicine

## 2015-08-20 ENCOUNTER — Ambulatory Visit (INDEPENDENT_AMBULATORY_CARE_PROVIDER_SITE_OTHER): Payer: Medicare Other | Admitting: Internal Medicine

## 2015-08-20 VITALS — HR 108 | Ht 65.0 in | Wt 191.2 lb

## 2015-08-20 DIAGNOSIS — J9611 Chronic respiratory failure with hypoxia: Secondary | ICD-10-CM | POA: Diagnosis not present

## 2015-08-20 DIAGNOSIS — J849 Interstitial pulmonary disease, unspecified: Secondary | ICD-10-CM | POA: Diagnosis not present

## 2015-08-20 NOTE — Assessment & Plan Note (Addendum)
Oxygenation at rest on 2 L prongs is better than I would've predicted based on appearance of her CT scan Plan-PFT

## 2015-08-20 NOTE — Assessment & Plan Note (Signed)
CT scan suggests dense pulmonary consolidation which is persistent. There may be an element of pulmonary edema but she doesn't have neck vein distention or peripheral edema apparent on today's exam. 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 She never had a lung biopsy, just BAL for washings. Question if her rheumatologist would consider VATS open lung biopsy worthwhile, now that she is more stable on lower flow oxygen. We would want updated echocardiogram and perhaps cardiology consultation first. Flu vaccine given

## 2015-08-20 NOTE — Patient Instructions (Signed)
Order- schedule PFT. Take O2 off at least for DLCO     Dx chronic respiratory failure with hypoxia, lupus pneumonitis  Flu vax

## 2015-08-20 NOTE — Progress Notes (Signed)
02/23/14- 36 yoF former smoker Referred by Dr Bridgett Larsson for allergy consult.  C/o itchy, watery eyes, sinus congestion, headaches, prodcough with green mucous X several years. Mother and son are here She had been going to allergist at [facility] for allergic rhinitis, itching eyes and chest tightness for years as an adult. Typically worse in spring and summer, with occasional colds and with exposure to house dust. Daily wheeze.She tried allergy shots for 2 months, 2 years ago but had strong local reactions. 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 Mother has hx allergies. CXR 5/ 8 /14 IMPRESSION:  No active disease. No significant change.  Original Report Authenticated By: Lahoma Crocker, M.D.  04/06/14- 67 yoF former smoker Referred by Dr Bridgett Larsson for allergy consult.  C/o itchy, watery eyes, sinus congestion, headaches, prodcough with green mucous X several years. Mother and son are here FOLLOWS FOR: Allergies have been doing well until outdoors with trees,etc. Allergy profile 02/23/2014-negative with total IgE 22.9. She does give history of allergy shots in the past, stopped because of large local reactions. Past history suggests stronger allergic reactions when she was younger. We can retest her if there is a question She still reports some itching in her throat and cough sometimes green sputum  06/27/14 [facility] follow up  Returns for follow up from recent hospital stay.  Reports breathing is doing well today. Is suppose to be on O2 at 4lm however did not bring O2 b/c   portable tank at home is malfunctioned. DME contacted and at home to fix system. Advised on importance of O2 use.  Says were very low off Oxygen - sats 54%-83% on room air unpon entering the exam room. Sats increased 90% on 4l/m  Admitted last week for significant hypoxia   Patient presented with hypoxemia of unknown etiology, requiring 4L of oxygen. Notably, she was hospitalized from 8/11-8/14 with a  similar presentation and was thought to have a viral pneumonitis complicated by atelectasis and mild edema vs. an autoimmune process and was discharged home on a course of levaquin and prednisone.  Her CT scan from 8/28 showed bilateral progressive lower peribronchial infiltrates with groundglass changes and nodular patter consistent with acute pneumonitis of unclear etiology. During her last admission, she was found to have a positive ANA, mildly elevated ESR, non-reactive HIV, and negative PPD. Also during her last hospitalization, cardiology saw her for a repeat echo that showed a decrease in EF (25-30%, from baseline 30-25% in 2013).  Viral panel was positive for rhinovirus previous admission but neg for this admit .  ESR was elevated 62 , decreased today at 55.  Pulmonology was consulted and did a BAL on 9/1 that showed 60% macrophages and 30% PMNs. Thus far, her AFB smear, fungal smear, legionella Ab, pneumocystis smear, ACE, and sputum culture are negative. Fungal neg , bal cx neg w/ non path oral flora , legionella neg  Has family hx of Lupus.  Anti smith ab neg and anti dsDNA neg  HSP panel was neg.  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 Status: Edited Related Problem: ILD (interstitial lung disease)    #1 bilateral progressive lower lobe peribronchial infiltrates with groundglass changes and nodular pattern consistent with acute pneumonitis of unclear etiology.  Note previous serologic studies show a speckled positive ANA 1-80 pattern and no evidence of  allergy factors or elevated IgE. This inflammatory pneumonitis is not yet discerned as to etiology therefore bronchoscopy would be indicated and will be pursued  Previous viral panel + for rhinovirus  BAL cx neg to date.  HSP neg .  Will cont on steroid challenge with close  follow up  Case discussed with Dr. Ardenia Stiner  Clinically she appears stable , will have her back in office with follow up cxr in 1 week as cxr w/ worse aeration today  Plan  Taper Prednisone 40mg daily and hold at this dose.  Continue on Oxygen 4l/m continuous flow 24/7 .  Labs and chest xray today .  Follow up Dr. Fifi Schindler In 1 weeks and As needed  Please contact office for sooner follow up if symptoms do not improve or worsen or seek emergency care    07/06/14-32 yoF former smoker originaly referred for allergy evaluation, rhinitis, asthmatic bronchitis, then hosp for ?ALI with lower zone pneumonitis/ BAL and special studies NEG,. Acute on Chronic respiratory failure. Pacemaker for AV block  O2 4L/ Advanced FOLLOWS FOR: Pt c/o DOE, prod cough with clear mucus and chest tightness when coughing. Pt last saw TP on 06/27/2014 for HFU .   08/07/14- 33 yoF former smoker originaly referred for allergy evaluation, rhinitis, asthmatic bronchitis, then hosp for ?ALI with lower zone pneumonitis/ BAL and special studies NEG,. Acute on Chronic respiratory failure. Pacemaker for AV block  O2 4L/ Advanced      Husband and son are here Continues oxygen but feels comfortable at rest taking it off for short intervals. As instructed, prednisone is now down to 20 mg daily for the last 2 weeks. She feels stable, not worse. Coughs a lot, clear mucus. Denies fever, blood, purulent sputum, edema, adenopathy. Pending cardiac ablation seeking better control of heart rate. CXR 07/06/14 IMPRESSION:  1. No change in aeration to the lungs compared with the previous  exam.  Electronically Signed  By: Taylor Stroud M.D.  On: 07/06/2014 15:56  10/08/14-  33 yoF former smoker originaly referred for allergy evaluation, rhinitis, asthmatic bronchitis, then hosp for ?ALI with lower zone pneumonitis/ BAL and special studies NEG,. Acute on Chronic respiratory failure. Pacemaker for AV block  O2 4L/ Advanced. Has continued prednisone 20 mg  daily. FOLLOW FOR:  hypoxemia; breathing doing fine, wants to get off oxygen; no complaints  Arrived 78% on 2L, 90% on 4L..  Husband and son here. Cough with clear phlegm. Feels better with no acute events. CXR 09/18/14  IMPRESSION: Bibasilar pulmonary infiltrates, improved from 08/07/2014. Electronically Signed  By: Thomas Register  On: 09/18/2014 13:35   12/11/14- 33 yoF former smoker originaly referred for allergy evaluation, rhinitis, asthmatic bronchitis, then hosp for ?ALI with lower zone pneumonitis/ BAL and special studies NEG,. Acute on Chronic respiratory failure/ Hypoxemia,. Pacemaker for AV block  O2 4L/ Advanced. Has continued prednisone 10 mg daily. FOLLOWS FOR: Pt states her breathing is doing well overall and continues to wear O2 through [facility]. CT max fac 10/30/14 IMPRESSION: 32 x 13 x 10 mm abscess in the upper lip area centrally. Inflamed/hyperplastic neck nodes bilaterally. No significant dental disease or sinus disease. Electronically Signed  By: Mark Gallerani M.D.  On: 10/30/2014 14:31  02/09/15- 33 yoF former smoker originaly referred for allergy evaluation, rhinitis, asthmatic bronchitis, then hosp for ?ALI with lower zone pneumonitis/ BAL and special studies NEG,. Acute on Chronic respiratory failure/ Hypoxemia,.DM Pacemaker for AV block  O2 2L/ Advanced. Has continued prednisone 10 mg daily. FOLLOWS FOR:   Pt states she is doing well; Wears O2 as needed during the day and at night. DME -[facility]. Husband and son here Continues prednisone 10 mg daily She says she is doing "great" with little cough and no acute events. Her oxygen at 2 L/m prevents her return to work but she hopes to get back quickly. Walk test on room air: 44/22/16: 98%, 92%, 91%, 90% at rest on room air. Pulse rate ranged 74-107. CXR 12/11/14-- I reviewed images. Coarse prominent lung markings in lower third bilaterally. Significantly improved from acute picture. IMPRESSION: 1. Persistent lung base  opacity similar to prior studies. The etiology of this is unclear, but given the lack of symptoms, it is likely chronic. 2. No convincing acute cardiopulmonary disease. Electronically Signed  By: Lajean Manes M.D.  On: 12/11/2014 13:36  04/19/15- 89 yoF former smoker originaly referred for allergy evaluation, rhinitis, asthmatic bronchitis, then hosp for ?ALI with lower zone pneumonitis/ BAL and special studies NEG,. Acute on Chronic respiratory failure/ Hypoxemia,.DM Pacemaker for AV block  O2 2L/ Advanced. FOLLOWS FOR Pt states she is doing well. Wears O2 2L as needed during the day and at night. DME- [facility] Now prednisone 5 mg every other day. Notices a little postnasal drip but denies cough, wheeze, chest pain. CXR 02/09/15-  IMPRESSION: Chronic changes at the lung bases. There is no active cardiopulmonary disease. Electronically Signed  By: David Martinique M.D.  On: 02/09/2015 15:08  08/20/15- 25 yoF former smoker originaly referred for allergy evaluation, rhinitis, asthmatic bronchitis, then hosp 2015 for ?ALI with lower zone pneumonitis/ BAL and special studies NEG,. Lupus,  Acute on Chronic respiratory failure/ Hypoxemia,.DM Pacemaker for AV block  O2 2L/ Advanced. Follows for: Chronic Respiratory Failure with Hypoxia. Pt c/o wet cough with thick clear mucus. Pt denies any wheeze or increased SOB.  Since last here she has felt breathing stable. Nasal stuffiness and postnasal drainage. Minor dry cough. Continuous oxygen 2 L/Advanced. Denies chest pain, nodes, fever, sweat, rash. Knees gets sore. Being followed for lupus on CellCept and daily maintenance prednisone 10 mg by rheumatologist/ Dr. Kandice Robinsons. I reviewed CT chest images with her at this visit CT chest  07/08/15 IMPRESSION: Study is limited by patient body habitus and by bilateral lower lobe consolidation. No pulmonary emboli detected but evaluation of third order branches particularly in the lower lobes is limited. Severe  bilaterally symmetric ground-glass consolidation. No significant pleural effusion. Appearance is similar to 06/16/2014. The apparent chronic nature and absence of pleural fluid suggests that this may not be due to pulmonary edema. Consider possibility of chronic lung disease including various entities related to interstitial pneumonitis as well as autoimmune disorders. Electronically Signed  By: Skipper Cliche M.D.  On: 07/08/2015 07:34  ROS-see HPI Constitutional:   No-   weight loss, night sweats, fevers, chills, fatigue, lassitude. HEENT:   , difficulty swallowing, tooth/dental problems, sore throat,       sneezing, itching, ear ache, nasal congestion, + post nasal drip,  CV:  No-   chest pain, orthopnea, PND, swelling in lower extremities, anasarca,                                                      dizziness, palpitations Resp: + shortness of breath with exertion or at rest.  productive cough, + non-productive cough,  No- coughing up of blood.               No- wheezing.   Skin: No-   rash or lesions. GI:   heartburn, indigestion, abdominal pain, nausea, vomiting,  GU:  MS:  No-   joint pain or swelling.   Neuro-     nothing unusual Psych:  No- change in mood or affect. No depression or anxiety.  No memory loss.  OBJ- Physical Exam   O2 sat 98% on 2 L General- Alert, Oriented, Affect-appropriate, Distress- none acute,  Skin- +acanthotic hyperpigmentation around neck Lymphadenopathy- none Head- atraumatic            Eyes- Gross vision intact, PERRLA, conjunctivae and secretions clear            Ears- Hearing, canals-normal            Nose-  no-Septal dev, mucus, polyps, erosion, perforation             Throat- Mallampati III , mucosa clear , drainage- none, tonsils- atrophic Neck- flexible , trachea midline, no stridor , thyroid nl, carotid no bruit Chest - symmetrical excursion , unlabored           Heart/CV- RRR , no murmur , no gallop  , no rub, nl s1  s2                           - JVD- none , edema- none, stasis changes- none, varices- none           Lung- clear to P&A diminished in bases, wheeze- none, cough-none , dullness-none, rub- none.           Chest wall-  Abd-  Br/ Gen/ Rectal- Not done, not indicated Extrem- cyanosis- none, clubbing, none, atrophy- none, strength- nl Neuro- grossly intact to observation

## 2015-08-22 ENCOUNTER — Other Ambulatory Visit: Payer: Self-pay | Admitting: Cardiology

## 2015-08-22 ENCOUNTER — Ambulatory Visit: Payer: Medicare Other | Attending: Family Medicine | Admitting: Physical Therapy

## 2015-08-22 ENCOUNTER — Other Ambulatory Visit: Payer: Self-pay | Admitting: Internal Medicine

## 2015-08-22 DIAGNOSIS — K219 Gastro-esophageal reflux disease without esophagitis: Secondary | ICD-10-CM

## 2015-08-22 DIAGNOSIS — G894 Chronic pain syndrome: Secondary | ICD-10-CM

## 2015-08-22 DIAGNOSIS — M2550 Pain in unspecified joint: Secondary | ICD-10-CM

## 2015-08-22 NOTE — Telephone Encounter (Signed)
Pt is calling for a refill on her percocet left up front for pick up. jw

## 2015-08-23 MED ORDER — OXYCODONE-ACETAMINOPHEN 5-325 MG PO TABS: 1.0000 | ORAL_TABLET | Freq: Four times a day (QID) | ORAL | Status: DC | PRN

## 2015-08-23 NOTE — Telephone Encounter (Signed)
Last refilled Percocet #60 tabs on 06/05/15 for chronic pain syndrome with polyarthralgias, possible RA with ?lupus. Printed refill today, left up front at [facility] for pick-up.  Please call patient to let her know that Percocet rx is ready for pick-up.  Saralyn Pilar, DO [facility], PGY-3

## 2015-08-23 NOTE — Telephone Encounter (Signed)
Left message on voicemail informing that rx was ready to be picked up. 

## 2015-08-23 NOTE — Addendum Note (Signed)
Addended by: Smitty Cords on: 08/23/2015 09:41 AM   Modules accepted: Orders

## 2015-08-23 NOTE — Addendum Note (Signed)
Addended by: Smitty Cords on: 08/23/2015 09:26 AM   Modules accepted: Orders

## 2015-08-29 ENCOUNTER — Ambulatory Visit: Payer: Medicare Other | Admitting: Physical Therapy

## 2015-08-31 ENCOUNTER — Ambulatory Visit (INDEPENDENT_AMBULATORY_CARE_PROVIDER_SITE_OTHER): Payer: Medicare Other | Admitting: Student

## 2015-08-31 VITALS — BP 113/54 | HR 75 | Temp 98.1°F | Wt 188.1 lb

## 2015-08-31 DIAGNOSIS — J069 Acute upper respiratory infection, unspecified: Secondary | ICD-10-CM | POA: Insufficient documentation

## 2015-08-31 NOTE — Assessment & Plan Note (Signed)
Congestion, cough and rhinorhea consistent with URI, lack of fever, SOB or increase in home O2 requirement reassuring against pulmonary infection - Discussed conservative measures including over the counter cold medicines as needed - Will continue conservative measures

## 2015-08-31 NOTE — Progress Notes (Signed)
Subjective:    Patient ID: Makayla Vasquez, female    DOB: Sep 12, 1981, 34 y.o.   MRN: [id number]   CC: Concern for sinus infection  HPI 34 y/o with h/o interstitial lung disease presents for concern for a sinus infection  -Concern for sinus infection - She has had congestion for 3-4 days with runny nose and cough productive of green sputum - Denies fevers, chills, reports some nausea but no emesis - She has a base line O2 requirement of 2 L from her ILD but denies needing to increase this   Review of Systems   See HPI for ROS.   Past Medical History  Diagnosis Date  . Cardiac pacemaker     a. Since age 6 in 26. b. Upgrade to BiV in 2013.  . Congenital complete AV block   . Obesity   . GERD (gastroesophageal reflux disease)   . Asymptomatic LV dysfunction     a. Echo in Dec 2011 with EF 35 to 40%. Felt to be due to paced rhythm. b. EF 25-30% in 07/2014.  . Seizures (HCC)     as a child- from high fever  . Anxiety   . Bipolar affective disorder (HCC)   . Depression     bipolar  . Carpal tunnel syndrome of right wrist   . Asthma     seasonal allergies   . Arthritis     rheumatoid arthritis- mild, no rheumatology care   . Hypertension   . Pneumonitis     a. a/w hypoxia - inflammatory - large workup 07/2014.  [name] Sinus tachycardia (HCC)   . Diabetes mellitus without complication (HCC)   . Presence of permanent cardiac pacemaker   . CHF (congestive heart failure) (HCC)   . Lupus (HCC)   . Lupus (systemic lupus erythematosus) (HCC)    Past Surgical History  Procedure Laterality Date  . Throat surgery  1994    s/p laser treatment  . Cesarean section    . Insert / replace / remove pacemaker      2001  . Cholecystectomy    . Iud removal  11/03/2011    Procedure: INTRAUTERINE DEVICE (IUD) REMOVAL;  Surgeon: Myra C. Marice Potter, MD;  Location: [facility] ORS;  Service: Gynecology;  Laterality: N/A;  . Cyst excision  12/10/2012    THYROID  . Thyroglossal duct cyst N/A 12/10/2012   Procedure: REVISION OF THYROGLOSSAL DUCT CYST EXCISION;  Surgeon: Serena Colonel, MD;  Location: [facility] OR;  Service: ENT;  Laterality: N/A;  Revision of Thyroglossal Duct Cyst Excision  . Nasal fracture surgery      /w plate   . Carpal tunnel with cubital tunnel Right 07/26/2013    Procedure: RIGHT LIMITED OPEN CARPAL TUNNEL RELEASE ,  RIGHT CUBITAL TUNNEL RELEASE, INSITU VERSES ULNAR NERVE DECOMPRESSION AND ANTERIOR TRANSPOSITION;  Surgeon: Dominica Severin, MD;  Location: [facility] OR;  Service: Orthopedics;  Laterality: Right;  . Video bronchoscopy Bilateral 06/19/2014    Procedure: VIDEO BRONCHOSCOPY WITHOUT FLUORO;  Surgeon: Kalman Shan, MD;  Location: [facility] ENDOSCOPY;  Service: Cardiopulmonary;  Laterality: Bilateral;  . Bi-ventricular pacemaker upgrade N/A 03/08/2012    Procedure: BI-VENTRICULAR PACEMAKER UPGRADE;  Surgeon: Marinus Maw, MD;  Location: [facility] CATH LAB;  Service: Cardiovascular;  Laterality: N/A;  . Atrial tach ablation N/A 08/14/2014    Procedure: ATRIAL TACH ABLATION;  Surgeon: Marinus Maw, MD;  Location: [facility] CATH LAB;  Service: Cardiovascular;  Laterality: N/A;  . Right heart catheterization N/A 10/26/2014  Procedure: RIGHT HEART CATH;  Surgeon: Dolores Patty, MD;  Location: [facility] CATH LAB;  Service: Cardiovascular;  Laterality: N/A;   OB History    Gravida Para Term Preterm AB TAB SAB Ectopic Multiple Living   1 1 1       1      Social History   Social History  . Marital Status: Single    Spouse Name: N/A  . Number of Children: 1  . Years of Education: N/A   Occupational History  . CNA    Social History Main Topics  . Smoking status: Former Smoker -- 0.25 packs/day for .5 years    Types: Cigarettes    Quit date: 07/25/1996  . Smokeless tobacco: Never Used  . Alcohol Use: No  . Drug Use: No  . Sexual Activity: Yes    Birth Control/ Protection: Implant, IUD     Comment: placed 04/2011   Other Topics Concern  . Not on file   Social History Narrative   Works at  05/2011.      Objective:  BP 113/54 mmHg  Pulse 75  Temp(Src) 98.1 F (36.7 C) (Oral)  Wt 188 lb 1.6 oz (85.322 kg) Vitals and nursing note reviewed  General: NAD HEENT: normal oropharynx, no exudates or erythema, no maxillary or frontal sinus tenderness Cardiac: RRR, Respiratory: CTAB, normal effort, no wheezes or crackles Skin: warm and dry, no rashes noted Neuro: alert and oriented, no focal deficits   Assessment & Plan:    URI (upper respiratory infection) Congestion, cough and rhinorhea consistent with URI, lack of fever, SOB or increase in home O2 requirement reassuring against pulmonary infection - Discussed conservative measures including over the counter cold medicines as needed - Will continue conservative measures     Alyssa A. [employer] MD, MS Family Medicine Resident PGY-1 Pager [phone]

## 2015-08-31 NOTE — Patient Instructions (Addendum)
Follow up in 1 month If you have fevers, worsening cough call the office or if you have increasing oxygen requirement go to the emergency room If you have any questions or concerns call the office

## 2015-09-04 ENCOUNTER — Telehealth: Payer: Self-pay | Admitting: Family Medicine

## 2015-09-04 DIAGNOSIS — J849 Interstitial pulmonary disease, unspecified: Secondary | ICD-10-CM

## 2015-09-04 NOTE — Telephone Encounter (Signed)
Pt called and would like a referral to see a lung specialist. Makayla Vasquez

## 2015-09-05 ENCOUNTER — Ambulatory Visit: Payer: Medicare Other | Admitting: Physical Therapy

## 2015-09-05 NOTE — Telephone Encounter (Signed)
Attempted to call patient back (did not reach her) to clarify the reason for her Pulmonology Referral request, as she has well established Chronic Lung Disease with Interstitial Lung Disease (ILD) and Chronic Respiratory Failure, she currently follows Dr. Jetty Duhamel at [facility], last appointment was 08/20/15.  New referral to pulmonology placed today. If not actually needed, please discontinue or disregard this new referral.  Thanks  Saralyn Pilar, DO [facility], PGY-3

## 2015-09-10 NOTE — Telephone Encounter (Signed)
Checked in with [facility] referral coordinator [place]. She reports that she just spoke with patient earlier today 11/21, regarding her Pulmonology referral. Patient is currently followed by [facility] (Dr Jetty Duhamel), and she is requesting a new referral to seek a second opinion regarding her chronic lung disease. At this time she will continue to follow with [facility].  Discussed with Purvis Sheffield and she states that the previous referral order placed on 09/05/15 at time of original phone call was already accepted for next year follow-up apt in 2017 with [facility]. However, she will require a new second Ambulatory Referral to Pulmonology order to schedule a new patient apt with possibly [facility] for 2nd opinion per patient request. This order was placed today 09/10/15.  Additionally, patient had requested her refill on Percocet again but was unaware that it was ready to be picked up. She will pick this up later from [facility], chart review shows it was printed and ready to pick up since 08/23/15.  Saralyn Pilar, DO [facility], PGY-3

## 2015-09-11 NOTE — Telephone Encounter (Signed)
LMOVM for pt to return call.  Appt made with [facility] pulmonology for second opinion.  Appt is 10/16/15 @ 10:30 with Dr. Susa Simmonds.  Pt can call to reschedule @ [phone]   Kordell Jafri, Maryjo Rochester, CMA

## 2015-09-11 NOTE — Telephone Encounter (Signed)
Patient informed. 

## 2015-09-12 ENCOUNTER — Ambulatory Visit: Payer: Medicare Other | Admitting: Physical Therapy

## 2015-09-20 ENCOUNTER — Ambulatory Visit: Payer: Medicare Other | Admitting: Family Medicine

## 2015-09-21 ENCOUNTER — Ambulatory Visit: Payer: Medicare Other | Admitting: Family Medicine

## 2015-09-24 NOTE — Progress Notes (Signed)
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   She has a h/o congenital HB and underwent first PPM at age 35. In 2011 had ECHO with EF 35-40%. LV dysfunction felt to be due to RV pacing so upgraded to CRT-P. Recent echo in 10/15 with EF 25%. She has had worsening HF symptoms and referred for evaluation for advanced HF therapies. She also has concomitant lung disease and this has limited b-blocker dosing.  She follows with Dr. Annamaria Boots for her lung disease. She is a former smoker.  Admitted in 8/15 and 9/15 with worsening respiratory status and severe hypoxia. Had extensive w/u. Thought to have a viral pneumonitis complicated by atelectasis and mild edema vs. an autoimmune process. Discharged home on a course of levaquin and prednisone.   She was offered a lung biopsy for further diagnosis versus empiric treatment with steroids. 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  Saw Dr. Annamaria Boots on 06/27/14. CXR with persistent infiltrates of unclear etiology. Prednisone weaned down now taking 16m one day and 140mthe next.  She returns for follow up. Evaluated by pulmonary Oct 31st with consideration for VATs for lung biopsy. She continues on 3 liters nasal cannula. Has body aches but thinks its from lupus.  Ongoing dyspnea with exertion. Denies PND/Orthopnea. No ICD shocks. Weight at home 185-188 pounds. Has not had meds this morning.    Test/Procedures PFTs (12/15) showed a severe restrictive defect concerning for an interstitial process.   EP study 10/15 for persistent tachycardia and found to have  sinus tach.   06/16/14 CT scan showed bilateral progressive lower peribronchial infiltrates with groundglass changes and nodular patter consistent with acute pneumonitis of unclear etiology.  06/20/14 BAL showed 60% macrophages and 30% PMNs. Her AFB smear, fungal smear, legionella Ab, pneumocystis smear, ACE, and sputum culture are negative. Her AFB, legionella, fungal, and BAL culture as well as her fungal Ab, hypersensitivity pneumonitis panel were all negative Serology: DsDNA, RF, ANCA, HSP all negative  RHC 10/26/2014  RA = 2 RV = 27/1/5 PA = 29/7 (18) PCW = 4 Fick cardiac output/index = 4.5/2.2 PVR = 3.0 WU FA sat = 98% PA sat = 64%, 61%  Labs (10/15): TSH normal K 3.6, creatinine 1.1 Labs (10/31/2014) L k 3.9 Creatinine 1.06  Labs 07/23/2015: K 3.8 Creatinine 0.82  Review of Systems: All systems reviewed and negative except as per HPI.   Past Medical History  Diagnosis Date  . Cardiac pacemaker     a. Since age [age] 1949b. Upgrade to BiV in 2013.  . Congenital complete AV block   . Obesity   . GERD (gastroesophageal reflux disease)   . Asymptomatic LV dysfunction     a. Echo in Dec 2011 with EF 35 to 40%. Felt to be due to paced rhythm. b. EF 25-30% in 07/2014.  . [place]- from [place] fever  . Anxiety   . Bipolar affective disorder ([place]  . Depression     bipolar  . Carpal tunnel syndrome of right wrist   . Asthma  seasonal allergies   . Arthritis     rheumatoid arthritis- mild, no rheumatology care   . Hypertension   . Pneumonitis     a. a/w hypoxia - inflammatory - large workup 07/2014.  [name] Sinus tachycardia ([place])   . Diabetes mellitus without complication ([place])   . Presence of permanent cardiac pacemaker   . CHF (congestive heart failure) ([place])   . Lupus ([place])   . Lupus (systemic lupus erythematosus) ([place])     Current Outpatient Prescriptions  Medication Sig Dispense Refill  . acetaminophen (TYLENOL) 500 MG tablet Take 500 mg by mouth  every 6 (six) hours as needed for mild pain.    [name] albuterol (PROVENTIL HFA;VENTOLIN HFA) 108 (90 BASE) MCG/ACT inhaler Inhale 2 puffs into the lungs every 4 (four) hours as needed for wheezing or shortness of breath. 1 each 1  . atorvastatin (LIPITOR) 40 MG tablet Take 1 tablet (40 mg total) by mouth daily. 90 tablet 3  . bisoprolol (ZEBETA) 5 MG tablet Take 0.5 tablets (2.5 mg total) by mouth daily. 30 tablet 0  . Blood Glucose Monitoring Suppl (ONE TOUCH ULTRA 2) W/DEVICE KIT Check blood sugar daily before breakfast 1 each 0  . budesonide-formoterol (SYMBICORT) 80-4.5 MCG/ACT inhaler Inhale 2 puffs into the lungs 2 (two) times daily as needed (for shortness of breath).     . calcium-vitamin D (OSCAL WITH D) 500-200 MG-UNIT per tablet Take 1 tablet by mouth daily with breakfast.     . etonogestrel (NEXPLANON) 68 MG IMPL implant 1 each by Subdermal route once.    . furosemide (LASIX) 20 MG tablet Take 1 tablet (20 mg total) by mouth daily. 30 tablet 0  . glucose blood (ONE TOUCH ULTRA TEST) test strip Check blood sugar daily before breakfast 100 each 0  . Insulin Glargine (LANTUS SOLOSTAR) 100 UNIT/ML Solostar Pen Inject 35 Units into the skin daily at 10 pm.    . Insulin Pen Needle (INSUPEN PEN NEEDLES) 32G X 4 MM MISC Use with insulin pen 30 each 0  . losartan (COZAAR) 25 MG tablet Take 0.5 tablets (12.5 mg total) by mouth daily. 30 tablet 0  . losartan (COZAAR) 25 MG tablet TAKE 1 TABLET BY MOUTH 2 TIMES DAILY. 60 tablet 0  . metFORMIN (GLUCOPHAGE) 1000 MG tablet TAKE 1 TABLET BY MOUTH TWICE A DAY WITH A MEAL 30 tablet 11  . mycophenolate (CELLCEPT) 500 MG tablet Take 500 mg by mouth 2 (two) times daily.    . Nutritional Supplements (GLUCERNA ADVANCE SHAKE) LIQD Take supplement daily in place of 1 meal. 30 Bottle 2  . oxyCODONE-acetaminophen (PERCOCET) 5-325 MG tablet Take 1 tablet by mouth every 6 (six) hours as needed for severe pain. 60 tablet 0  . pantoprazole (PROTONIX) 40 MG tablet TAKE  ONE TABLET BY MOUTH ONCE DAILY 30 tablet 5  . potassium chloride SA (KLOR-CON M20) 20 MEQ tablet Take 1 tablet (20 mEq total) by mouth daily. 30 tablet 2  . predniSONE (DELTASONE) 20 MG tablet Take 2 tablets (40 mg total) by mouth daily with breakfast. 60 tablet 0   No current facility-administered medications for this encounter.    Allergies  Allergen Reactions  . Sertraline Hcl Hives  . Tape Other (See Comments)    Burns skin      Social History   Social History  . Marital Status: Single    Spouse Name: N/A  . Number of Children: 1  . Years of Education: N/A   Occupational History  .  CNA    Social History Main Topics  . Smoking status: Former Smoker -- 0.25 packs/day for .5 years    Types: Cigarettes    Quit date: 07/25/1996  . Smokeless tobacco: Never Used  . Alcohol Use: No  . Drug Use: No  . Sexual Activity: Yes    Birth Control/ Protection: Implant, IUD     Comment: placed 04/2011   Other Topics Concern  . Not on file   Social History Narrative   Works at [employer].        Family History  Problem Relation Age of Onset  . Heart disease Mother     CHF (no details)  . Hypertension Mother   . Heart disease Father     Murmur  . Heart disease Sister 69     No details.  History of a pacemaker  . Lupus Mother     Danley Danker Vitals:   09/25/15 0956  BP: 98/62  Pulse: 108  Weight: 187 lb 12.8 oz (85.186 kg)  SpO2: 86%    PHYSICAL EXAM: General:  Obese young woman wearing O2. No respiratory difficulty. Ambulated slowly into the clinic. HEENT: normal Neck: supple. JVP flat arotids 2+ bilat; no bruits. No lymphadenopathy or thryomegaly appreciated. Cor: PMI nonpalpable. Regular. No rubs, gallops or murmurs. Lungs: clear On 3 liters [place] Abdomen: obese soft, nontender, nondistended. No hepatosplenomegaly. No bruits or masses. Good bowel sounds. Extremities: no cyanosis, clubbing, rash, no edema Neuro: alert & oriented x 3, cranial nerves grossly intact. moves all  4 extremities w/o difficulty. Affect pleasant.  ASSESSMENT & PLAN: 1) Chronic systolic HF: 06/6282 EF 66-29%.--> Echo 06/2015 Recovered EF 50-55%.  Nonischemic cardiomyopathy.  Medtronic device.  Had [place] in January with low filling pressures and adequate output.  NYHA class  IIIb (pulmonary in nature). Check ECHO next week as requested by pulmonary.  Todays Optivol- fluid index flat, activity ~ 1 hour per day.No AF/VT.  -  Volume status looks ok. Continue lasix 20 mg daily .  - Continue bisoprolol 2.5 mg daily.  - Continue losartan 12.5 mg daily.    -Can keep off spiro for now. IF EF back down can add at that time.      - She understands she needs to prevent pregnanany. Nexplanon in place RUE.   2) Chronic respiratory failure with diffuse lung infiltrates of unknown etiology:  Lung infiltrates and symptoms much improved with prednisone. Followed by Dr Annamaria Boots. Considering VATS due to persistent lung consolidation. Check ECHO 3) Congenital AVN block s/p pacer 4) Diabetes- Uncontrolled- Asked to follow up with PCP.   Check ECHO  Follow up in 3 months with Dr Aundra Dubin.   Jaylnn Ullery,NP-C 1:47 PM

## 2015-09-25 ENCOUNTER — Ambulatory Visit (HOSPITAL_COMMUNITY)
Admission: RE | Admit: 2015-09-25 | Discharge: 2015-09-25 | Disposition: A | Discharge status: Home / Self Care | Payer: Medicare Other | Source: Ambulatory Visit | Attending: Internal Medicine | Admitting: Internal Medicine

## 2015-09-25 VITALS — BP 98/62 | HR 108 | Wt 187.8 lb

## 2015-09-25 DIAGNOSIS — Z79899 Other long term (current) drug therapy: Secondary | ICD-10-CM | POA: Diagnosis not present

## 2015-09-25 DIAGNOSIS — Z794 Long term (current) use of insulin: Secondary | ICD-10-CM | POA: Diagnosis not present

## 2015-09-25 DIAGNOSIS — J45909 Unspecified asthma, uncomplicated: Secondary | ICD-10-CM | POA: Diagnosis not present

## 2015-09-25 DIAGNOSIS — I5033 Acute on chronic diastolic (congestive) heart failure: Secondary | ICD-10-CM | POA: Diagnosis not present

## 2015-09-25 DIAGNOSIS — I442 Atrioventricular block, complete: Secondary | ICD-10-CM | POA: Diagnosis not present

## 2015-09-25 DIAGNOSIS — I5022 Chronic systolic (congestive) heart failure: Secondary | ICD-10-CM | POA: Insufficient documentation

## 2015-09-25 DIAGNOSIS — Z888 Allergy status to other drugs, medicaments and biological substances status: Secondary | ICD-10-CM | POA: Insufficient documentation

## 2015-09-25 DIAGNOSIS — R918 Other nonspecific abnormal finding of lung field: Secondary | ICD-10-CM | POA: Insufficient documentation

## 2015-09-25 DIAGNOSIS — Z950 Presence of cardiac pacemaker: Secondary | ICD-10-CM | POA: Diagnosis not present

## 2015-09-25 DIAGNOSIS — Z87891 Personal history of nicotine dependence: Secondary | ICD-10-CM | POA: Diagnosis not present

## 2015-09-25 DIAGNOSIS — F419 Anxiety disorder, unspecified: Secondary | ICD-10-CM | POA: Insufficient documentation

## 2015-09-25 DIAGNOSIS — M329 Systemic lupus erythematosus, unspecified: Secondary | ICD-10-CM | POA: Insufficient documentation

## 2015-09-25 DIAGNOSIS — K219 Gastro-esophageal reflux disease without esophagitis: Secondary | ICD-10-CM | POA: Insufficient documentation

## 2015-09-25 DIAGNOSIS — I10 Essential (primary) hypertension: Secondary | ICD-10-CM | POA: Insufficient documentation

## 2015-09-25 DIAGNOSIS — E669 Obesity, unspecified: Secondary | ICD-10-CM | POA: Insufficient documentation

## 2015-09-25 DIAGNOSIS — M069 Rheumatoid arthritis, unspecified: Secondary | ICD-10-CM | POA: Diagnosis not present

## 2015-09-25 DIAGNOSIS — F319 Bipolar disorder, unspecified: Secondary | ICD-10-CM | POA: Diagnosis not present

## 2015-09-25 DIAGNOSIS — E1169 Type 2 diabetes mellitus with other specified complication: Secondary | ICD-10-CM

## 2015-09-25 DIAGNOSIS — J9611 Chronic respiratory failure with hypoxia: Secondary | ICD-10-CM | POA: Diagnosis not present

## 2015-09-25 DIAGNOSIS — Z7984 Long term (current) use of oral hypoglycemic drugs: Secondary | ICD-10-CM | POA: Diagnosis not present

## 2015-09-25 DIAGNOSIS — I429 Cardiomyopathy, unspecified: Secondary | ICD-10-CM | POA: Diagnosis not present

## 2015-09-25 DIAGNOSIS — E119 Type 2 diabetes mellitus without complications: Secondary | ICD-10-CM | POA: Diagnosis not present

## 2015-09-25 NOTE — Patient Instructions (Signed)
Your physician has requested that you have an echocardiogram. Echocardiography is a painless test that uses sound waves to create images of your heart. It provides your doctor with information about the size and shape of your heart and how well your heart's chambers and valves are working. This procedure takes approximately one hour. There are no restrictions for this procedure.  10/09/15 @ 1100 am code 0009  Your physician recommends that you schedule a follow-up appointment in: 3 months with Dr.McLean  Do the following things EVERYDAY: 1) Weigh yourself in the morning before breakfast. Write it down and keep it in a log. 2) Take your medicines as prescribed 3) Eat low salt foods-Limit salt (sodium) to 2000 mg per day.  4) Stay as active as you can everyday 5) Limit all fluids for the day to less than 2 liters 6)

## 2015-09-26 ENCOUNTER — Ambulatory Visit (INDEPENDENT_AMBULATORY_CARE_PROVIDER_SITE_OTHER): Payer: Medicare Other | Admitting: *Deleted

## 2015-09-26 DIAGNOSIS — I442 Atrioventricular block, complete: Secondary | ICD-10-CM | POA: Diagnosis not present

## 2015-09-27 NOTE — Progress Notes (Signed)
Remote pacemaker transmission.   

## 2015-09-28 ENCOUNTER — Other Ambulatory Visit: Payer: Self-pay | Admitting: Cardiology

## 2015-10-01 ENCOUNTER — Telehealth: Payer: Self-pay | Admitting: Family Medicine

## 2015-10-01 NOTE — Telephone Encounter (Signed)
Would like to talk to dr Kirtland Bouchard  Wants to talk to hime" about a lot of stuff"

## 2015-10-02 NOTE — Telephone Encounter (Addendum)
Called Makayla Vasquez back today 10/02/15 to follow-up on her concerns. She reports overall "not feeling well" for past 2-3 weeks. Describes a constellation of symptoms. Reports nasal and sinus congestion with post-nasal drainage and dry cough for past 2-3 weeks, initially worse then improving now with cough better, not significantly productive but thinks "congestion is thick", currently denies any fevers, chest pain, shortness of breath, still has home O2. Also associated with some arthralgias and joint pain, especially knees. Last visit with Rheumatology 2-3 months ago, she states that they continue to tell her that she "doesn't have lupus", continue CellCept and Prednisone down to 10mg  daily, also taking Percocet for joint pain. Next Pulmonology appointment 10/16/15. Also recently admits to seeing Dentist and has been placed on Amoxicillin, not taking every day, couldn't tell me exactly what this rx was for.  She is seeking general advice. I told her that given current illness and multiple symptoms, she would best be served if she could follow-up at [facility] to be evaluated. We need to make sure that her oxygen saturation is stable, given chronic ILD and O2 in past. I advised that she may finish her current course of Amoxicillin as prescribed by dentist. Recommended to start OTC Mucinex twice daily for up to 5 to 7 days for thicker congestion. She understood, will follow-up at [facility] otherwise reviewed criteria to go to ED if worsening.  [facility], DO [facility], PGY-3

## 2015-10-05 ENCOUNTER — Inpatient Hospital Stay (HOSPITAL_COMMUNITY): Payer: Medicare Other

## 2015-10-05 ENCOUNTER — Ambulatory Visit (INDEPENDENT_AMBULATORY_CARE_PROVIDER_SITE_OTHER): Payer: Medicare Other | Admitting: Family Medicine

## 2015-10-05 ENCOUNTER — Encounter: Payer: Self-pay | Admitting: Family Medicine

## 2015-10-05 ENCOUNTER — Inpatient Hospital Stay (HOSPITAL_COMMUNITY)
Admission: AD | Admit: 2015-10-05 | Discharge: 2015-10-07 | DRG: 315 | Disposition: A | Discharge status: Home / Self Care | Payer: Medicare Other | Source: Ambulatory Visit | Attending: Family Medicine | Admitting: Family Medicine

## 2015-10-05 VITALS — BP 80/50 | HR 107 | Temp 98.5°F | Ht 65.0 in | Wt 181.4 lb

## 2015-10-05 DIAGNOSIS — M79601 Pain in right arm: Secondary | ICD-10-CM | POA: Diagnosis not present

## 2015-10-05 DIAGNOSIS — M79669 Pain in unspecified lower leg: Secondary | ICD-10-CM | POA: Insufficient documentation

## 2015-10-05 DIAGNOSIS — I5032 Chronic diastolic (congestive) heart failure: Secondary | ICD-10-CM | POA: Diagnosis present

## 2015-10-05 DIAGNOSIS — R0600 Dyspnea, unspecified: Secondary | ICD-10-CM | POA: Diagnosis present

## 2015-10-05 DIAGNOSIS — Z888 Allergy status to other drugs, medicaments and biological substances status: Secondary | ICD-10-CM | POA: Diagnosis not present

## 2015-10-05 DIAGNOSIS — M069 Rheumatoid arthritis, unspecified: Secondary | ICD-10-CM | POA: Diagnosis present

## 2015-10-05 DIAGNOSIS — Z79891 Long term (current) use of opiate analgesic: Secondary | ICD-10-CM

## 2015-10-05 DIAGNOSIS — I442 Atrioventricular block, complete: Secondary | ICD-10-CM | POA: Diagnosis present

## 2015-10-05 DIAGNOSIS — M329 Systemic lupus erythematosus, unspecified: Secondary | ICD-10-CM | POA: Diagnosis present

## 2015-10-05 DIAGNOSIS — Z79899 Other long term (current) drug therapy: Secondary | ICD-10-CM | POA: Diagnosis not present

## 2015-10-05 DIAGNOSIS — K219 Gastro-esophageal reflux disease without esophagitis: Secondary | ICD-10-CM | POA: Diagnosis present

## 2015-10-05 DIAGNOSIS — Z794 Long term (current) use of insulin: Secondary | ICD-10-CM

## 2015-10-05 DIAGNOSIS — T380X5A Adverse effect of glucocorticoids and synthetic analogues, initial encounter: Secondary | ICD-10-CM | POA: Diagnosis present

## 2015-10-05 DIAGNOSIS — M79606 Pain in leg, unspecified: Secondary | ICD-10-CM | POA: Insufficient documentation

## 2015-10-05 DIAGNOSIS — R0602 Shortness of breath: Secondary | ICD-10-CM

## 2015-10-05 DIAGNOSIS — I420 Dilated cardiomyopathy: Secondary | ICD-10-CM | POA: Diagnosis not present

## 2015-10-05 DIAGNOSIS — F319 Bipolar disorder, unspecified: Secondary | ICD-10-CM | POA: Diagnosis present

## 2015-10-05 DIAGNOSIS — Z950 Presence of cardiac pacemaker: Secondary | ICD-10-CM

## 2015-10-05 DIAGNOSIS — I959 Hypotension, unspecified: Principal | ICD-10-CM | POA: Diagnosis present

## 2015-10-05 DIAGNOSIS — J9611 Chronic respiratory failure with hypoxia: Secondary | ICD-10-CM | POA: Diagnosis not present

## 2015-10-05 DIAGNOSIS — J069 Acute upper respiratory infection, unspecified: Secondary | ICD-10-CM | POA: Diagnosis present

## 2015-10-05 DIAGNOSIS — Z7984 Long term (current) use of oral hypoglycemic drugs: Secondary | ICD-10-CM | POA: Diagnosis not present

## 2015-10-05 DIAGNOSIS — E785 Hyperlipidemia, unspecified: Secondary | ICD-10-CM | POA: Diagnosis present

## 2015-10-05 DIAGNOSIS — Z87891 Personal history of nicotine dependence: Secondary | ICD-10-CM | POA: Diagnosis not present

## 2015-10-05 DIAGNOSIS — I110 Hypertensive heart disease with heart failure: Secondary | ICD-10-CM | POA: Diagnosis present

## 2015-10-05 DIAGNOSIS — M79604 Pain in right leg: Secondary | ICD-10-CM | POA: Diagnosis present

## 2015-10-05 DIAGNOSIS — I509 Heart failure, unspecified: Secondary | ICD-10-CM | POA: Diagnosis not present

## 2015-10-05 DIAGNOSIS — Z7952 Long term (current) use of systemic steroids: Secondary | ICD-10-CM

## 2015-10-05 DIAGNOSIS — E099 Drug or chemical induced diabetes mellitus without complications: Secondary | ICD-10-CM | POA: Diagnosis present

## 2015-10-05 LAB — COMPREHENSIVE METABOLIC PANEL
ALK PHOS: 51 U/L (ref 38–126)
ALT: 60 U/L — ABNORMAL HIGH (ref 14–54)
ANION GAP: 8 (ref 5–15)
AST: 103 U/L — ABNORMAL HIGH (ref 15–41)
Albumin: 2.3 g/dL — ABNORMAL LOW (ref 3.5–5.0)
BILIRUBIN TOTAL: 2.5 mg/dL — AB (ref 0.3–1.2)
BUN: 11 mg/dL (ref 6–20)
CALCIUM: 8.6 mg/dL — AB (ref 8.9–10.3)
CO2: 24 mmol/L (ref 22–32)
Chloride: 104 mmol/L (ref 101–111)
Creatinine, Ser: 0.74 mg/dL (ref 0.44–1.00)
Glucose, Bld: 133 mg/dL — ABNORMAL HIGH (ref 65–99)
Potassium: 5.7 mmol/L — ABNORMAL HIGH (ref 3.5–5.1)
SODIUM: 136 mmol/L (ref 135–145)
TOTAL PROTEIN: 6.5 g/dL (ref 6.5–8.1)

## 2015-10-05 LAB — CBC WITH DIFFERENTIAL/PLATELET
Basophils Absolute: 0 10*3/uL (ref 0.0–0.1)
Basophils Relative: 0 %
EOS ABS: 0.1 10*3/uL (ref 0.0–0.7)
Eosinophils Relative: 1 %
HCT: 35.7 % — ABNORMAL LOW (ref 36.0–46.0)
HEMOGLOBIN: 10.8 g/dL — AB (ref 12.0–15.0)
LYMPHS ABS: 0.8 10*3/uL (ref 0.7–4.0)
LYMPHS PCT: 10 %
MCH: 26.3 pg (ref 26.0–34.0)
MCHC: 30.3 g/dL (ref 30.0–36.0)
MCV: 86.9 fL (ref 78.0–100.0)
MONOS PCT: 6 %
Monocytes Absolute: 0.4 10*3/uL (ref 0.1–1.0)
NEUTROS PCT: 83 %
Neutro Abs: 6.7 10*3/uL (ref 1.7–7.7)
Platelets: 356 10*3/uL (ref 150–400)
RBC: 4.11 MIL/uL (ref 3.87–5.11)
RDW: 15.6 % — ABNORMAL HIGH (ref 11.5–15.5)
WBC: 8.1 10*3/uL (ref 4.0–10.5)

## 2015-10-05 LAB — SEDIMENTATION RATE: Sed Rate: 95 mm/hr — ABNORMAL HIGH (ref 0–22)

## 2015-10-05 LAB — LACTIC ACID, PLASMA
LACTIC ACID, VENOUS: 2.6 mmol/L — AB (ref 0.5–2.0)
Lactic Acid, Venous: 1.2 mmol/L (ref 0.5–2.0)

## 2015-10-05 LAB — GLUCOSE, CAPILLARY
GLUCOSE-CAPILLARY: 140 mg/dL — AB (ref 65–99)
Glucose-Capillary: 117 mg/dL — ABNORMAL HIGH (ref 65–99)

## 2015-10-05 LAB — C-REACTIVE PROTEIN: CRP: 16.4 mg/dL — ABNORMAL HIGH (ref <1.0)

## 2015-10-05 LAB — TROPONIN I: Troponin I: 0.17 ng/mL — ABNORMAL HIGH (ref <0.031)

## 2015-10-05 LAB — BRAIN NATRIURETIC PEPTIDE: B Natriuretic Peptide: 30.3 pg/mL (ref 0.0–100.0)

## 2015-10-05 MED ORDER — HEPARIN SODIUM (PORCINE) 5000 UNIT/ML IJ SOLN
5000.0000 [IU] | Freq: Three times a day (TID) | INTRAMUSCULAR | Status: DC
  Administered 2015-10-05 – 2015-10-07 (×6): 5000 [IU] via SUBCUTANEOUS
  Filled 2015-10-05 (×6): qty 1

## 2015-10-05 MED ORDER — INSULIN ASPART 100 UNIT/ML ~~LOC~~ SOLN
0.0000 [IU] | Freq: Every day | SUBCUTANEOUS | Status: DC
  Administered 2015-10-06: 3 [IU] via SUBCUTANEOUS

## 2015-10-05 MED ORDER — OXYCODONE-ACETAMINOPHEN 5-325 MG PO TABS: 1.0000 | ORAL_TABLET | Freq: Four times a day (QID) | ORAL | Status: DC | PRN

## 2015-10-05 MED ORDER — SODIUM CHLORIDE 0.9 % IV BOLUS (SEPSIS)
1000.0000 mL | Freq: Once | INTRAVENOUS | Status: AC
  Administered 2015-10-05: 1000 mL via INTRAVENOUS

## 2015-10-05 MED ORDER — ALBUTEROL SULFATE HFA 108 (90 BASE) MCG/ACT IN AERS: 2.0000 | INHALATION_SPRAY | RESPIRATORY_TRACT | Status: DC | PRN

## 2015-10-05 MED ORDER — BISOPROLOL FUMARATE 5 MG PO TABS
2.5000 mg | ORAL_TABLET | Freq: Every day | ORAL | Status: DC
  Filled 2015-10-05: qty 1

## 2015-10-05 MED ORDER — MYCOPHENOLATE MOFETIL 250 MG PO CAPS
500.0000 mg | ORAL_CAPSULE | Freq: Two times a day (BID) | ORAL | Status: DC
  Administered 2015-10-05 – 2015-10-07 (×4): 500 mg via ORAL
  Filled 2015-10-05 (×5): qty 2

## 2015-10-05 MED ORDER — SODIUM CHLORIDE 0.9 % IV SOLN
INTRAVENOUS | Status: DC
  Administered 2015-10-05 (×2): via INTRAVENOUS

## 2015-10-05 MED ORDER — PREDNISONE 20 MG PO TABS
40.0000 mg | ORAL_TABLET | Freq: Every day | ORAL | Status: DC
  Administered 2015-10-06: 40 mg via ORAL
  Filled 2015-10-05 (×2): qty 2

## 2015-10-05 MED ORDER — SODIUM CHLORIDE 0.9 % IV BOLUS (SEPSIS)
500.0000 mL | Freq: Once | INTRAVENOUS | Status: AC
  Administered 2015-10-05: 500 mL via INTRAVENOUS

## 2015-10-05 MED ORDER — ALBUTEROL SULFATE (2.5 MG/3ML) 0.083% IN NEBU: 2.5000 mg | INHALATION_SOLUTION | RESPIRATORY_TRACT | Status: DC | PRN

## 2015-10-05 MED ORDER — INSULIN ASPART 100 UNIT/ML ~~LOC~~ SOLN
0.0000 [IU] | Freq: Three times a day (TID) | SUBCUTANEOUS | Status: DC
  Administered 2015-10-05: 2 [IU] via SUBCUTANEOUS
  Administered 2015-10-06: 3 [IU] via SUBCUTANEOUS
  Administered 2015-10-06 – 2015-10-07 (×2): 5 [IU] via SUBCUTANEOUS

## 2015-10-05 MED ORDER — PANTOPRAZOLE SODIUM 40 MG PO TBEC
40.0000 mg | DELAYED_RELEASE_TABLET | Freq: Every day | ORAL | Status: DC
  Administered 2015-10-05 – 2015-10-07 (×3): 40 mg via ORAL
  Filled 2015-10-05 (×3): qty 1

## 2015-10-05 MED ORDER — ATORVASTATIN CALCIUM 40 MG PO TABS
40.0000 mg | ORAL_TABLET | Freq: Every day | ORAL | Status: DC
  Administered 2015-10-06: 40 mg via ORAL
  Filled 2015-10-05 (×2): qty 1

## 2015-10-05 MED ORDER — BUDESONIDE-FORMOTEROL FUMARATE 80-4.5 MCG/ACT IN AERO
2.0000 | INHALATION_SPRAY | Freq: Two times a day (BID) | RESPIRATORY_TRACT | Status: DC | PRN
  Filled 2015-10-05: qty 6.9

## 2015-10-05 MED ORDER — ACETAMINOPHEN 500 MG PO TABS: 500.0000 mg | ORAL_TABLET | Freq: Four times a day (QID) | ORAL | Status: DC | PRN

## 2015-10-05 NOTE — Progress Notes (Addendum)
Phone call received from LAB regarding abnormal lab value Lactid acid 2.6, informed to family medicine.

## 2015-10-05 NOTE — Progress Notes (Signed)
Date of Visit: 10/05/2015   HPI:  Cough began 3 weeks ago. Started with clear thick mucous. Then started having post-tussive emesis of clear liquids. Had to go up on her O2 - had previously been on 2L, now requiring 4L due to feeling short of breath. Has been febrile to 101 two days ago. Has had cold chills and feels weak. Has been drinking and trying to force herself to eat. Thinks she may have pneumonia or a lupus flare. Does note tenderness of her R lower leg but no swelling. Has been on amoxicillin as an outpatient for a dental issue. Denies any chest pain. Does    ROS: See HPI.  PMFSH: history of congenital complete AV block, thyroglossal duct cyst, obesity, GERD, bipolar disorder, dilated cardiomyopathy, CHF, type 2 diabetes, asthma, pacemaker in place, SLE, hypertension, chronic respiratory failure, interstitial lung disease  PHYSICAL EXAM: BP 99/54 mmHg  Pulse 107  Temp(Src) 98.5 F (36.9 C) (Oral)  Ht 5\' 5"  (1.651 m)  Wt 181 lb 6.4 oz (82.283 kg)  BMI 30.19 kg/m2  O2 sat >94% at rest on 2L  Repeat 80/50, then 85/55 Gen: NAD, sitting in chair. Well appearing HEENT: normocephalic, atraumatic. moist mucous membranes. oropharynx clear and moist. Tympanic membranes clear bilaterally. Heart: irregular, mildly tachycardic without murmur Lungs: clear to auscultation bilaterally. Normal work of breathing. Nasal canula in place Neuro: alert, grossly nonfocal Ext: RLE very mildly tender along calf. No erythema or swelling.  ASSESSMENT/PLAN:  34 yo F with complex pmhx including complete heart block with pacemaker in place, CHF, SLE, interstitial lung disease on chronic O2 presenting with cough, shortness of breath, fever, and noted lower blood pressure than is normal for her, also tachycardia today in clinic. Concern given patient's medical history that she may not have much reserve, has also already been on amoxicillin as outpatient for dental issue. She states she feels bad enough to be  admitted to the hospital, despite being well appearing today in clinic. Will arrange for admission to telemetry unit at least overnight for workup and treatment, to include CBC, CMET, CXR, BNP, sed rate, CRP, EKG, lactic acid, RLE venous doppler. Low threshold for transfer to SDU if BPs persistently low. Likely needs stress dosed steroids. Inpatient team notified. Patient transported to hospital by clinic staff.   [address] [place], MD [facility]

## 2015-10-05 NOTE — Progress Notes (Signed)
Pt received via direct admit from home with a complain of dyspnea with cough, BP is in lower side, IV bolus 500c NS provided, resting comfortably in bed, oxygen is continue via [place], will continue to monitor the patient.

## 2015-10-05 NOTE — H&P (Signed)
[facility] Admission History and Physical Service Pager: [phone]  Patient name: Makayla Vasquez Medical record number: [id number] Date of birth: September 26, 1981 Age: 34 y.o. Gender: female  Primary Care Provider: Nobie Putnam, DO Consultants: none Code Status: FULL  Chief Complaint: dyspnea with cough and new oxygen requirement  Assessment and Plan: Makayla Vasquez is a 34 y.o. female presenting with dyspnea and cough . PMH is significant for SLE, pacemaker, Type II DM, dilated cardiomyopathy, CHF, congenital complete AV block, thyroglossal duct cyst, GERD, bipolar disorder, HTN, and ILD.   Dyspnea with cough and increased oxygen requirement: Pt presents after three weeks of worsening productive cough and dyspnea. Per pulmonology note, patient is supposed to be on 4L O2 at home, however she reports she has been using 2L. She had to increase back to 4L three weeks ago after cough. Pt found to be hypotensive  (to 80/50) and tachycardic (HR 108) in clinic today with O2 sat 86% on 4L. Pt has been on amoxicillin within past week for dental infection but has not been taking as prescribed. Last CXR three months ago showed chronic increased interstitial markings consistent with known ILD. Differential includes PNA, worsening of preexisting ILD exacerbated by URI, and bronchitis. PE included in differential given patient's leg tenderness, SOB, and tachycardia, however less likely diagnosis (Well's Score for PE 1.5 placing patient in low risk group). Follows with pulmonology and has known chronic respiratory failure with hypoxia. Supposed to be on 4L of O2 24/7 per last pulmonology note in October so this may not be a new/increased oxygen requirement. Last admission to hospital for acute pneumonitis with similar symptoms.  - observation with telemetry, attending Dr. Gwendlyn Deutscher - CBC, CMET, BNP, CRP, LA, sed rate, blood cultures, troponins - EKG - CXR - Low threshold for  transfer to SDU if decompensates - PT/OT consult -vitals per floor protocol -maintain O2 at 4L; titrate up as needed for O2 sats <90% - Continue home Albuterol and Symbicort - Continuous pulse oximetry  RLE pain: Pt reports tenderness but no swelling of R lower leg. No TTP, swelling, or erythema of leg on admission. Well's Score for DVT is 1, placing patient in low risk group, however given patient's tachycardia, tenderness, and SOB, will perform [place] to rule out DVT. Most likely just her chronic polyarthralgia from Lupus.   - RLE venous doppler  Lupus: Recent diagnosis in 05/2015. Follows with rheumatology. Believe her BLE weakness is side effect of lupus as many patients experience muscle aches and weakness.  - Cont home prednisone 40 mg qd (per pulmonology holding at this dose) - Continue home CellCept for polyarthralgias  - Continue home Percocet 5-368m q6hrs for severe pain - obtaining ESR and CRP -PT/OT consult (last seen outpatient rehab in 07/2015)  4. Congestive Heart Failure: Last ECHO (09/16) with EF 50-55% and grade 2 diastolic dysfunction. Cardiologist is Dr. BHaroldine Laws Pt did not appear fluid overloaded on exam today; no peripheral edema, crackles, JVD.  -Will receive 500cc NS bolus and provide gentle hydration with NS 727mhr overnight.  -Will d/c fluids if labs suggest acute on chronic heart failure - fluids being given for hypotension and tachycardia - Hold Lasix for now in the setting of hypotension. Resume when pressures normotensive to avoid fluid overload.   - F/u BNP -could consider repeat echo; she had future order placed for 12/6 which she never received.  5. Congenital complete AV block, with biventricular pacemaker: Tachycardic up to 108 in clinic today.  -  Telemetry - Vitals per unit routine - F/u EKG  6. T2DM, steroid-induced: Last HgbA1c 8.5 (07/16). Taking Metformin 1075m bid, Lantus 35-50 units qhs at home.  - Hold Lantus - SSI - CBGs ACHS  7. HLD -  Continue home Lipitor 45m 8. History of HTN. Currently hypotensive.  -Holding home bisprolol, Cozaar and spironolactone in the setting of hypotension -500 mL NS bolus ordered  FEN/GI: carb-modified heart healthy diet, NS@75  mL/hr Prophylaxis: subQ heparin  Disposition: place in observation with telemetry  History of Present Illness:  Makayla Vasquez a [age].o. female presenting as direct admit from clinic with cough, dyspnea, and increased oxygen requirement.   Patient reports cough productive of thick clear sputum with accompanying dyspnea for the past three weeks.  Pt endorses post-tussive emesis, as well as chest pain when coughing and intermittent abdominal pain after coughing. Two days ago she had a fever of 101F.  She is regularly on 2L O2 at home, but was having difficulty breathing when her cough began, so increased to 4L. Has had decreased appetite and thirst.   Pt [facility] concerned about patient's respiratory status. She was subsequently directly admitted.    Patient has been taking amoxicillin prescribed by dentist after dental infection since last week. She was prescribed a seven day course, however she has missed multiple days and still has four doses left.   Review Of Systems: Per HPI with the following additions: endorses R leg tenderness but no swelling Otherwise the remainder of the systems were negative.  Patient Active Problem List   Diagnosis Date Noted  . URI (upper respiratory infection) 08/31/2015  . Adrenal insufficiency (HCJemison  . Acute on chronic diastolic CHF (congestive heart failure) (HCHudson Oaks  . Arterial hypotension   . SOB (shortness of breath)   . Lupus (systemic lupus erythematosus) (HCHood River  . Congenital heart block   . Chronic diastolic congestive heart failure (HCMazon  . Hypotension 07/08/2015  . Fatigue 06/05/2015  . Nausea with vomiting 06/05/2015  . Hyperlipidemia associated with type 2 diabetes mellitus (HCAlbia 05/04/2015  . Polyarthralgia 04/26/2015  . Rhinosinusitis 04/19/2015  . Screening for cervical cancer 01/19/2015  . Onychomycosis of right great toe 12/14/2014  . Discoloration of skin 12/01/2014  . Lip abscess 11/02/2014  . CHF (congestive heart failure) (HCMiner  . Diabetes mellitus type 2 in obese (HCOnalaska  . Hyperglycemia 10/24/2014  . Dehydration   . Sinus tachycardia (HCRancho Cordova  . Chronic respiratory failure with hypoxia (HCWatkins10/19/2015  . Atrial tachycardia (HCArbuckle10/04/2014  . Insertion of implantable subdermal contraceptive 07/12/2014  . Acute respiratory failure with hypoxia (HCFrisco08/31/2015  . ILD (interstitial lung disease) (HCDuran08/31/2015  . Pulmonary infiltrates 06/17/2014  . Dyspnea 06/16/2014  . Hypoxemia 06/16/2014  . Congestive dilated cardiomyopathy (HCGilmore City08/09/2014  . Nonallergic rhinitis 05/22/2014  . Vaginal itching 05/22/2014  . Pneumonia 05/09/2014  . Shortness of breath 05/08/2014  . Sterilization consult 04/20/2014  . Breast tenderness 01/18/2014  . Carpal tunnel syndrome 09/10/2012  . Constipation 03/03/2012  . Chronic systolic heart failure (HCGeneva05/06/2012  . Chest pain, unspecified 12/29/2011  . Post-nasal drip 10/24/2011  . Contraception 01/29/2011  . Heart palpitations   . PPM-Medtronic   . Congenital complete AV block   . Bipolar affective disorder (HCMillhousen  . Obesity   . GERD (gastroesophageal reflux disease)   . Hypertension 01/05/2011  . THYROGLOSSAL DUCT CYST  07/18/2010  . UNSPECIFIED CARDIAC DYSRHYTHMIA 05/07/2010  . RH FACTOR, NEGATIVE 04/08/2010  . Mild intermittent asthma without complication 95/06/3266  . 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 HEART BLOCK 12/17/2006  . IRRITABLE BOWEL SYNDROME 12/17/2006    Past Medical History: Past Medical History  Diagnosis Date  . Cardiac pacemaker     a. Since age 30 in 78. b. Upgrade to BiV in 2013.  . Congenital complete AV block   . Obesity   . GERD (gastroesophageal reflux disease)   .  Asymptomatic LV dysfunction     a. Echo in Dec 2011 with EF 35 to 40%. Felt to be due to paced rhythm. b. EF 25-30% in 07/2014.  . Seizures ([place])     as a child- from high fever  . Anxiety   . Bipolar affective disorder ([place])   . Depression     bipolar  . Carpal tunnel syndrome of right wrist   . Asthma     seasonal allergies   . Arthritis     rheumatoid arthritis- mild, no rheumatology care   . Hypertension   . Pneumonitis     a. a/w hypoxia - inflammatory - large workup 07/2014.  [name] Sinus tachycardia ([place])   . Diabetes mellitus without complication ([place])   . Presence of permanent cardiac pacemaker   . CHF (congestive heart failure) ([place])   . Lupus ([place])   . Lupus (systemic lupus erythematosus) ([place])     Past Surgical History: Past Surgical History  Procedure Laterality Date  . Throat surgery  1994    s/p laser treatment  . Cesarean section    . Insert / replace / remove pacemaker      2001  . Cholecystectomy    . Iud removal  11/03/2011    Procedure: INTRAUTERINE DEVICE (IUD) REMOVAL;  Surgeon: [name], MD;  Location: [facility];  Service: Gynecology;  Laterality: N/A;  . Cyst excision  12/10/2012    THYROID  . Thyroglossal duct cyst N/A 12/10/2012    Procedure: REVISION OF THYROGLOSSAL DUCT CYST EXCISION;  Surgeon: [name], MD;  Location: [facility];  Service: ENT;  Laterality: N/A;  Revision of Thyroglossal Duct Cyst Excision  . Nasal fracture surgery      /w plate   . Carpal tunnel with cubital tunnel Right 07/26/2013    Procedure: RIGHT LIMITED OPEN CARPAL TUNNEL RELEASE ,  RIGHT CUBITAL TUNNEL RELEASE, INSITU VERSES ULNAR NERVE DECOMPRESSION AND ANTERIOR TRANSPOSITION;  Surgeon: [name], MD;  Location: [facility];  Service: Orthopedics;  Laterality: Right;  . Video bronchoscopy Bilateral 06/19/2014    Procedure: VIDEO BRONCHOSCOPY WITHOUT FLUORO;  Surgeon: Brand Males, MD;  Location: [facility];  Service: Cardiopulmonary;  Laterality: Bilateral;  .  Bi-ventricular pacemaker upgrade N/A 03/08/2012    Procedure: BI-VENTRICULAR PACEMAKER UPGRADE;  Surgeon: [name], MD;  Location: [facility] CATH LAB;  Service: Cardiovascular;  Laterality: N/A;  . Atrial tach ablation N/A 08/14/2014    Procedure: ATRIAL TACH ABLATION;  Surgeon: [name], MD;  Location[facility] C[facility] LAB;  Service: Cardiovascular;  Laterality: N/A;  . Right heart catheterization N/A 10/26/2014    Procedure: RIGHT HEART CATH;  Surgeon: [name], MD;  Location: [facility] C[facility] LAB;  Service: Cardiovascular;  Laterality: N/A;    Social History: Social History  Substance Use Topics  . Smoking status: Former Smoker -- 0.25 packs/day for .5 years    Types: Cigarettes    Quit date: 07/25/1996  . Smokeless tobacco: Never  Used  . Alcohol Use: No   Additional social history: Patient not currently using tobacco products.   Please also refer to relevant sections of EMR.  Family History: Family History  Problem Relation Age of Onset  . Heart disease Mother     CHF (no details)  . Hypertension Mother   . Heart disease Father     Murmur  . Heart disease Sister 34     No details.  History of a pacemaker  . Lupus Mother     Allergies and Medications: Allergies  Allergen Reactions  . Sertraline Hcl Hives  . Tape Other (See Comments)    Burns skin   No current facility-administered medications on file prior to encounter.   Current Outpatient Prescriptions on File Prior to Encounter  Medication Sig Dispense Refill  . acetaminophen (TYLENOL) 500 MG tablet Take 500 mg by mouth every 6 (six) hours as needed for mild pain.    [name] albuterol (PROVENTIL HFA;VENTOLIN HFA) 108 (90 BASE) MCG/ACT inhaler Inhale 2 puffs into the lungs every 4 (four) hours as needed for wheezing or shortness of breath. 1 each 1  . atorvastatin (LIPITOR) 40 MG tablet Take 1 tablet (40 mg total) by mouth daily. 90 tablet 3  . bisoprolol (ZEBETA) 5 MG tablet Take 0.5 tablets (2.5 mg total) by mouth daily. 30  tablet 0  . Blood Glucose Monitoring Suppl (ONE TOUCH ULTRA 2) W/DEVICE KIT Check blood sugar daily before breakfast 1 each 0  . budesonide-formoterol (SYMBICORT) 80-4.5 MCG/ACT inhaler Inhale 2 puffs into the lungs 2 (two) times daily as needed (for shortness of breath).     . calcium-vitamin D (OSCAL WITH D) 500-200 MG-UNIT per tablet Take 1 tablet by mouth daily with breakfast.     . etonogestrel (NEXPLANON) 68 MG IMPL implant 1 each by Subdermal route once.    . furosemide (LASIX) 20 MG tablet Take 1 tablet (20 mg total) by mouth daily. 30 tablet 0  . glucose blood (ONE TOUCH ULTRA TEST) test strip Check blood sugar daily before breakfast 100 each 0  . Insulin Glargine (LANTUS SOLOSTAR) 100 UNIT/ML Solostar Pen Inject 35 Units into the skin daily at 10 pm.    . Insulin Pen Needle (INSUPEN PEN NEEDLES) 32G X 4 MM MISC Use with insulin pen 30 each 0  . losartan (COZAAR) 25 MG tablet Take 0.5 tablets (12.5 mg total) by mouth daily. 30 tablet 0  . losartan (COZAAR) 25 MG tablet TAKE 1 TABLET BY MOUTH 2 TIMES DAILY. 60 tablet 3  . metFORMIN (GLUCOPHAGE) 1000 MG tablet TAKE 1 TABLET BY MOUTH TWICE A DAY WITH A MEAL 30 tablet 11  . mycophenolate (CELLCEPT) 500 MG tablet Take 500 mg by mouth 2 (two) times daily.    . Nutritional Supplements (GLUCERNA ADVANCE SHAKE) LIQD Take supplement daily in place of 1 meal. 30 Bottle 2  . oxyCODONE-acetaminophen (PERCOCET) 5-325 MG tablet Take 1 tablet by mouth every 6 (six) hours as needed for severe pain. 60 tablet 0  . pantoprazole (PROTONIX) 40 MG tablet TAKE ONE TABLET BY MOUTH ONCE DAILY 30 tablet 5  . potassium chloride SA (KLOR-CON M20) 20 MEQ tablet Take 1 tablet (20 mEq total) by mouth daily. 30 tablet 2  . predniSONE (DELTASONE) 20 MG tablet Take 2 tablets (40 mg total) by mouth daily with breakfast. 60 tablet 0    Objective: BP 95/61 mmHg  Pulse 91  Temp(Src) 97.6 F (36.4 C) (Oral)  Resp 18  Ht 5'  5" (1.651 m)  Wt 179 lb 4.8 oz (81.33 kg)   BMI 29.84 kg/m2  SpO2 100% Exam: General: well-nourished female sitting on edge of bed in NAD wearing 4L [place] Eyes: EOMI, no scleral icterus, no conjunctival injection ENTM: MMM Neck: supple, full ROM, no JVD Cardiovascular: RRR, no murmurs appreciated Respiratory: CTAB, no wheezing, normal work of breathing, decreased air movement. Nasal canula in place.  Abdomen: soft, mild tenderness to palpation of RLQ, non-distended, +BS MSK: no redness, erythema, or TTP of R leg, no abnormalities noted on L leg, 3/5 strength bilaterally LE, 4/5 strength bilaterally UE Skin: warm, dry, intact. hyperpigmentation of upper legs bilaterally, no other rashes or bruises noted Neuro: A&Ox4, no gross deficits  Psych: appropriate mood and affect  Verner Mould, MD 10/05/2015, 4:13 PM PGY-1, [place] Intern pager: [phone], text pages welcome  FPTS Upper-Level Resident Addendum  I have independently interviewed and examined the patient. I have discussed the above with the original author and agree with their documentation. My edits for correction/addition/clarification are in pink. Please see also any attending notes.   Katheren Shams, DO PGY-2, [place] Service pager: [phone] (text pages welcome through [facility])

## 2015-10-06 ENCOUNTER — Inpatient Hospital Stay (HOSPITAL_COMMUNITY): Payer: Medicare Other

## 2015-10-06 DIAGNOSIS — M79669 Pain in unspecified lower leg: Secondary | ICD-10-CM

## 2015-10-06 DIAGNOSIS — R0600 Dyspnea, unspecified: Secondary | ICD-10-CM

## 2015-10-06 DIAGNOSIS — I509 Heart failure, unspecified: Secondary | ICD-10-CM | POA: Insufficient documentation

## 2015-10-06 DIAGNOSIS — I959 Hypotension, unspecified: Principal | ICD-10-CM

## 2015-10-06 LAB — LACTIC ACID, PLASMA
LACTIC ACID, VENOUS: 1 mmol/L (ref 0.5–2.0)
Lactic Acid, Venous: 1.3 mmol/L (ref 0.5–2.0)

## 2015-10-06 LAB — BASIC METABOLIC PANEL
Anion gap: 7 (ref 5–15)
BUN: 8 mg/dL (ref 6–20)
CO2: 25 mmol/L (ref 22–32)
CREATININE: 0.67 mg/dL (ref 0.44–1.00)
Calcium: 7.9 mg/dL — ABNORMAL LOW (ref 8.9–10.3)
Chloride: 108 mmol/L (ref 101–111)
GFR calc Af Amer: >60 mL/min (ref >60)
Glucose, Bld: 133 mg/dL — ABNORMAL HIGH (ref 65–99)
Potassium: 3.5 mmol/L (ref 3.5–5.1)
SODIUM: 140 mmol/L (ref 135–145)

## 2015-10-06 LAB — GLUCOSE, CAPILLARY
GLUCOSE-CAPILLARY: 254 mg/dL — AB (ref 65–99)
Glucose-Capillary: 92 mg/dL (ref 65–99)
Glucose-Capillary: 173 mg/dL — ABNORMAL HIGH (ref 65–99)
Glucose-Capillary: 203 mg/dL — ABNORMAL HIGH (ref 65–99)
Glucose-Capillary: 243 mg/dL — ABNORMAL HIGH (ref 65–99)

## 2015-10-06 LAB — TROPONIN I: Troponin I: <0.03 ng/mL (ref <0.031)

## 2015-10-06 LAB — CORTISOL-AM, BLOOD: CORTISOL - AM: 6.8 ug/dL (ref 6.7–22.6)

## 2015-10-06 MED ORDER — HYDROCORTISONE NA SUCCINATE PF 100 MG IJ SOLR
50.0000 mg | Freq: Four times a day (QID) | INTRAMUSCULAR | Status: DC
  Administered 2015-10-06: 50 mg via INTRAVENOUS
  Filled 2015-10-06: qty 2

## 2015-10-06 MED ORDER — HYDROCORTISONE NA SUCCINATE PF 100 MG IJ SOLR
50.0000 mg | Freq: Three times a day (TID) | INTRAMUSCULAR | Status: DC
Start: 2015-10-06 — End: 2015-10-07
  Administered 2015-10-06 – 2015-10-07 (×3): 50 mg via INTRAVENOUS
  Filled 2015-10-06 (×3): qty 2

## 2015-10-06 MED ORDER — SODIUM CHLORIDE 0.9 % IV SOLN
INTRAVENOUS | Status: AC
  Administered 2015-10-06: 01:00:00 via INTRAVENOUS

## 2015-10-06 NOTE — Progress Notes (Signed)
Family Medicine Teaching Service Daily Progress Note Intern Pager: [phone]  Patient name: Makayla Vasquez Medical record number: [id number] Date of birth: [birth year] Age: 34 y.o. Gender: female  Primary Care Provider: Nobie Putnam, DO Consultants: None Code Status: Full  Pt Overview and Major Events to Date:  12/16 - Admitted for dyspnea; hypotension  Assessment and Plan: Makayla Vasquez is a 34 y.o. female presenting with dyspnea and cough . PMH is significant for SLE, pacemaker, Type II DM, dilated cardiomyopathy, CHF, congenital complete AV block, thyroglossal duct cyst, GERD, bipolar disorder, HTN, and ILD.   Dyspnea with cough and increased oxygen requirement: Pt presents after three weeks of worsening productive cough and dyspnea. Per pulmonology note, patient is supposed to be on 4L O2 at home, however she reports she has been using 2L. She had to increased back to 4L three weeks ago after cough. CXR showing cardiac enlargement and mod interstitial edema. BNP was normal. Do not believe hypoxia is new but patient at baseline. Follows with pulmonology and has known chronic respiratory failure with hypoxia. Differential includes worsening of preexisting ILD exacerbated by URI. Last admission to hospital for acute pneumonitis with similar symptoms. - EKG pending - PT/OT consult - BCx pending -trop and LA initially elevated but after fluids returned to normal x2; afebrile and VSS. Believe due to demand ischemia and hypoperfusion. - vitals per floor protocol -maintain O2 at 4L; titrate up as needed for O2 sats <90% - Continue home Albuterol and Symbicort - Continuous pulse oximetry - Low threshold for transfer to SDU if decompensates - DME order for pulse ox at discharge  Hypotension:  Pt found to be hypotensive (to 80/50) and tachycardic (HR 108) in clinic. Has received 1.5L of fluid bolus and mIVF for 12hrs without improvement in her hypotension. No symptoms. -on daily  prednisone 71m; has had adrenal insuffiencey before. Will hold this medication. -collect AM cortisol level -begin stress dose IV hydrocortisone at 50q6; adjust as needed -vitals changed to q4hrs  -consider orthostatic BPs; patient has low BPs in outpatient setting ranging from 98-115/70-80.  RLE pain: Pt reports tenderness but no swelling of R lower leg. No TTP, swelling, or erythema of leg on admission. Well's Score for DVT is 1, placing patient in low risk group, however given patient's tachycardia, tenderness, and SOB, will perform [place] rule out DVT. Most likely just her chronic polyarthralgia from Lupus.  - RLE venous doppler pending  Lupus: Recent diagnosis in 05/2015. Follows with rheumatology. Believe her BLE weakness is side effect of lupus as many patients experience muscle aches and weakness. CRP 16.4 and ESR 95. May have some component of mild Lupus flare. - Holding home prednisone; giving stress dose steroids. Will taper back down to home dose. - Continue home CellCept for polyarthralgias  - Continue home Percocet 5-327mq6hrs for severe pain -PT/OT consult (last seen outpatient rehab in 07/2015)  Congestive Heart Failure: Stable. Last ECHO (09/16) with EF 50-55% and grade 2 diastolic dysfunction. Cardiologist is Dr. BeHaroldine LawsPt did not appear fluid overloaded on exam today; no peripheral edema, crackles, JVD. BNP 30.3. CXR with some cardiac enlargement and mod interstitial edema.  -s/p 1.5L and hydration. No signs of fluid overload - Hold Lasix for now in the setting of hypotension. Resume when pressures normotensive to avoid fluid overload.  -could consider repeat echo; she had future order placed for 12/6 which she never received.  Congenital complete AV block, with biventricular pacemaker: Tachycardia has resolved.  - Telemetry -  Vitals per unit routine - F/u EKG  T2DM, steroid-induced: Last HgbA1c 8.5 (07/16). Taking Metformin 1072m bid, Lantus 35-50 units qhs at  home.  - Hold Lantus - SSI - CBGs ACHS  HLD - Continue home Lipitor 430m History of HTN. Currently hypotensive.  -Holding home bisprolol, Cozaar and spironolactone in the setting of hypotension  FEN/GI: carb-modified heart healthy diet, SLIV Prophylaxis: subQ heparin  Disposition: Continue current management; pending improvement  Subjective:  Doing well this morning. States she feels about the same compared to yesterday. Denies any symptoms of hypotension including lightheadedness and dizziness. No CP.   Objective: Temp:  [97.6 F (36.4 C)-98.5 F (36.9 C)] 97.9 F (36.6 C) (12/16 2007) Pulse Rate:  [91-107] 91 (12/16 2007) Resp:  [18] 18 (12/16 1603) BP: (80-99)/(50-61) 90/52 mmHg (12/16 2007) SpO2:  [98 %-100 %] 98 % (12/16 2007) Weight:  [179 lb 4.8 oz (81.33 kg)-181 lb 6.4 oz (82.283 kg)] 179 lb 4.8 oz (81.33 kg) (12/16 1603) Physical Exam: Gen: NAD, Well appearing HEENT: normocephalic, atraumatic. moist mucous membranes. oropharynx clear and moist. EOMI Heart:RRR, without murmur Lungs: clear to auscultation bilaterally. Normal work of breathing. Nasal canula in place Neuro: alert, grossly nonfocal Ext: No erythema or swelling.  Laboratory:  Recent Labs Lab 10/05/15 1715  WBC 8.1  HGB 10.8*  HCT 35.7*  PLT 356    Recent Labs Lab 10/05/15 1715 10/06/15 0530  NA 136 140  K 5.7* 3.5  CL 104 108  CO2 24 25  BUN 11 8  CREATININE 0.74 0.67  CALCIUM 8.6* 7.9*  PROT 6.5  --   BILITOT 2.5*  --   ALKPHOS 51  --   ALT 60*  --   AST 103*  --   GLUCOSE 133* 133*   Lactic acid - 2.6>1.3>1.0 Trop - .17 > neg x2 BCx pending  Imaging/Diagnostic Tests: Dg Chest 2 View  10/05/2015  CLINICAL DATA:  Shortness of breath EXAM: CHEST  2 VIEW COMPARISON:  07/08/2015 FINDINGS: Left chest wall ICD is noted with leads in the right atrial appendage, coronary sinus and right ventricle. There is moderate cardiac enlargement noted. Bibasilar atelectasis. A moderate  interstitial edema is noted. IMPRESSION: 1. Cardiac enlargement and moderate interstitial edema consistent with CHF. Electronically Signed   By: TaKerby Moors.D.   On: 10/05/2015 20:53    JaKatheren ShamsDO 10/06/2015, 5:06 AM PGY-2, [facility] pager: [phone] pages welcome

## 2015-10-07 ENCOUNTER — Other Ambulatory Visit: Payer: Self-pay

## 2015-10-07 ENCOUNTER — Inpatient Hospital Stay (HOSPITAL_COMMUNITY): Payer: Medicare Other

## 2015-10-07 DIAGNOSIS — M79669 Pain in unspecified lower leg: Secondary | ICD-10-CM

## 2015-10-07 DIAGNOSIS — M79604 Pain in right leg: Secondary | ICD-10-CM

## 2015-10-07 DIAGNOSIS — M79601 Pain in right arm: Secondary | ICD-10-CM

## 2015-10-07 DIAGNOSIS — R0602 Shortness of breath: Secondary | ICD-10-CM

## 2015-10-07 DIAGNOSIS — M79606 Pain in leg, unspecified: Secondary | ICD-10-CM | POA: Insufficient documentation

## 2015-10-07 LAB — GLUCOSE, CAPILLARY
Glucose-Capillary: 217 mg/dL — ABNORMAL HIGH (ref 65–99)
Glucose-Capillary: 278 mg/dL — ABNORMAL HIGH (ref 65–99)

## 2015-10-07 MED ORDER — PREDNISONE 10 MG PO TABS: 10.0000 mg | ORAL_TABLET | Freq: Every day | ORAL | Status: DC

## 2015-10-07 MED ORDER — PREDNISONE 20 MG PO TABS: 10.0000 mg | ORAL_TABLET | Freq: Every day | ORAL | Status: DC

## 2015-10-07 NOTE — Progress Notes (Signed)
Utilization Review Completed.Makayla Vasquez T12/18/2016  

## 2015-10-07 NOTE — Evaluation (Signed)
Physical Therapy Evaluation Patient Details Name: Makayla Vasquez MRN: [id number] DOB: Aug 28, 1981 Today's Date: 10/07/2015   History of Present Illness    34 y.o. female presenting with dyspnea and cough . PMH is significant for SLE, pacemaker, Type II DM, dilated cardiomyopathy, CHF, congenital complete AV block, thyroglossal duct cyst, GERD, bipolar disorder, HTN, and ILD   Clinical Impression  Pt presents at/near her functional baseline.  Pt admits mobility limitations when lupus flares, but otherwise has no significant limitations.  Instructed on use of modalities to assist with pain management and to incr physical activity between flares.  No PT needs otherwise.    Follow Up Recommendations No PT follow up    Equipment Recommendations  None recommended by PT    Recommendations for Other Services       Precautions / Restrictions        Mobility  Bed Mobility Overal bed mobility: Independent                Transfers Overall transfer level: Independent Equipment used: None                Ambulation/Gait Ambulation/Gait assistance: Independent Ambulation Distance (Feet): 50 Feet Assistive device: None Gait Pattern/deviations: Step-through pattern        Stairs            Wheelchair Mobility    Modified Rankin (Stroke Patients Only)       Balance Overall balance assessment: No apparent balance deficits (not formally assessed)                                           Pertinent Vitals/Pain Pain Assessment: 0-10 Pain Score: 6     Home Living Family/patient expects to be discharged to:: Private residence Living Arrangements: Spouse/significant other Available Help at Discharge: Friend(s);Family;Available PRN/intermittently Type of Home: Apartment Home Access: Stairs to enter   Entrance Stairs-Number of Steps: 2 short steps Home Layout: One level Home Equipment: None      Prior Function Level of Independence:  Independent         Comments: unless in a lupus flare pt is functionally independent with supplemental O2     Hand Dominance   Dominant Hand: Right    Extremity/Trunk Assessment   Upper Extremity Assessment: Overall WFL for tasks assessed (grossly, wtih some fine motor deficits)           Lower Extremity Assessment: Overall WFL for tasks assessed         Communication   Communication: No difficulties  Cognition Arousal/Alertness: Awake/alert Behavior During Therapy: WFL for tasks assessed/performed Overall Cognitive Status: Within Functional Limits for tasks assessed                      General Comments      Exercises        Assessment/Plan    PT Assessment Patent does not need any further PT services  PT Diagnosis Difficulty walking   PT Problem List    PT Treatment Interventions     PT Goals (Current goals can be found in the Care Plan section) Acute Rehab PT Goals PT Goal Formulation: All assessment and education complete, DC therapy    Frequency     Barriers to discharge        Co-evaluation  End of Session Equipment Utilized During Treatment: Oxygen Activity Tolerance: Patient tolerated treatment well Patient left: in chair;with call bell/phone within reach Nurse Communication: Mobility status         Time: 1425-1440 PT Time Calculation (min) (ACUTE ONLY): 15 min   Charges:   PT Evaluation $Initial PT Evaluation Tier I: 1 Procedure     PT G Codes:        Dennis Bast 10/07/2015, 3:04 PM

## 2015-10-07 NOTE — Progress Notes (Signed)
Family Medicine Teaching Service Daily Progress Note Intern Pager: [phone]  Patient name: Makayla Vasquez Medical record number: [id number] Date of birth: 07-Aug-1981 Age: 34 y.o. Gender: female  Primary Care Provider: Nobie Putnam, DO Consultants: None Code Status: Full  Pt Overview and Major Events to Date:  12/16 - Admitted for dyspnea; hypotension  Assessment and Plan: Makayla Vasquez is a 34 y.o. female presenting with dyspnea and cough . PMH is significant for SLE, pacemaker, Type II DM, dilated cardiomyopathy, CHF, congenital complete AV block, thyroglossal duct cyst, GERD, bipolar disorder, HTN, and ILD.   Dyspnea with cough: Likely due to baseline ILD with potentially exacerbation due to URI, at her baseline O2 requirement - EKG pending - PT/OT consult - BCx NGTD -trop and LA initially elevated but after fluids returned to normal x2; afebrile and VSS. Believe due to demand ischemia and hypoperfusion. - vitals per floor protocol -maintain O2 at home 4L; titrate up as needed for O2 sats <90% - Continue home Albuterol and Symbicort - Continuous pulse oximetry - Low threshold for transfer to SDU if decompensates - DME order for pulse ox at discharge  Hypotension:  Pt found to be hypotensive (to 80/50) and tachycardic (HR 108) in clinic. Has received 1.5L of fluid bolus and mIVF for 12hrs without improvement in her hypotension. No symptoms. Base line BPs on chart review typically in low 100s/60s -on daily prednisone 42m for ILD per Pulm -AM cortisol level, wnl -Will d/c stress dose IV hydrocortisone 12/18; restart home prednisone -will obtain orthostatic BPs  RLE pain: Denies LE pain this AM, previous pain likely chronic in nature - RLE venous doppler neg for DVT  Lupus: Recent diagnosis in 05/2015. Follows with rheumatology. Believe her BLE weakness is side effect of lupus as many patients experience muscle aches and weakness. CRP 16.4 and ESR 95. May have some  component of mild Lupus flare - restart home prednisone - Continue home CellCept for polyarthralgias  - Continue home Percocet [id number] for severe pain -PT/OT consult (last seen outpatient rehab in 07/2015)  Congestive Heart Failure: Stable. Last ECHO (09/16) with EF 50-55% and grade 2 diastolic dysfunction. Cardiologist is Dr. BeHaroldine LawsPt did not appear fluid overloaded on exam today; no peripheral edema, crackles, JVD. BNP 30.3. CXR with some cardiac enlargement and mod interstitial edema.  -s/p 1.5L and hydration. No signs of fluid overload - Hold Lasix for now in the setting of hypotension. Resume when pressures normotensive to avoid fluid overload.  -could consider repeat echo; she had future order placed for 12/6 which she never received.  Congenital complete AV block, with biventricular pacemaker: Tachycardia has resolved.  - Telemetry - Vitals per unit routine - F/u EKG  T2DM, steroid-induced: Last HgbA1c 8.5 (07/16). Taking Metformin 100047mid, Lantus 35-50 units qhs at home.  - Hold Lantus - SSI - CBGs ACHS  HLD - Continue home Lipitor 58m66mistory of HTN. Currently hypotensive, but at her baseline  -Holding home bisprolol, Cozaar and spironolactone in the setting of hypotension  FEN/GI: carb-modified heart healthy diet, SLIV Prophylaxis: subQ heparin  Disposition: Continue current management; pending improvement  Subjective:  Doing well this morning. States she feels at her baseline, denies weakness, was able to ambulate to the bathroom without dizziness or lightheadedness, denies chest pain, or SOB  Objective: Temp:  [97.7 F (36.5 C)-97.9 F (36.6 C)] 97.8 F (36.6 C) (12/18 0442) Pulse Rate:  [60-94] 60 (12/18 0442) Resp:  [18-20] 18 (12/18 0442) BP: (86-118)/(28-57)  86/50 mmHg (12/18 0442) SpO2:  [98 %-100 %] 100 % (12/18 0442) Weight:  [183 lb 3.2 oz (83.1 kg)] 183 lb 3.2 oz (83.1 kg) (12/18 0442) Physical Exam: Gen: NAD, Well  appearing Heart:RRR, without murmur Lungs: clear to auscultation bilaterally. Normal work of breathing. Nasal canula in place Neuro: alert, grossly nonfocal Ext: No erythema, tenderness or swelling.  Laboratory:  Recent Labs Lab 10/05/15 1715  WBC 8.1  HGB 10.8*  HCT 35.7*  PLT 356    Recent Labs Lab 10/05/15 1715 10/06/15 0530  NA 136 140  K 5.7* 3.5  CL 104 108  CO2 24 25  BUN 11 8  CREATININE 0.74 0.67  CALCIUM 8.6* 7.9*  PROT 6.5  --   BILITOT 2.5*  --   ALKPHOS 51  --   ALT 60*  --   AST 103*  --   GLUCOSE 133* 133*   Lactic acid - 2.6>1.3>1.0 Trop - .17 > neg x2 BCx NGTD  Imaging/Diagnostic Tests: Dg Chest 2 View  10/05/2015  CLINICAL DATA:  Shortness of breath EXAM: CHEST  2 VIEW COMPARISON:  07/08/2015 FINDINGS: Left chest wall ICD is noted with leads in the right atrial appendage, coronary sinus and right ventricle. There is moderate cardiac enlargement noted. Bibasilar atelectasis. A moderate interstitial edema is noted. IMPRESSION: 1. Cardiac enlargement and moderate interstitial edema consistent with CHF. Electronically Signed   By: Kerby Moors M.D.   On: 10/05/2015 20:53    Veatrice Bourbon, MD 10/07/2015, 8:28 AM PGY-2, [place] Intern pager: [phone], text pages welcome

## 2015-10-07 NOTE — Discharge Instructions (Signed)
Your blood pressures where low in the hospital. Please stop your blood pressure medication until you are seen in clinic. Please stop your Lasix was well until you are seen in clinic.    Follow-up Information    Follow up with Tawni Carnes, MD On 10/09/2015.   Specialty:  Family Medicine   Why:  2:45 pm   Contact information:   [address] [place] [address] [phone]

## 2015-10-07 NOTE — Progress Notes (Signed)
*  PRELIMINARY RESULTS* Vascular Ultrasound Right lower extremity venous duplex has been completed.  Preliminary findings: No evidence of DVT or baker's cyst.   Farrel Demark, RDMS, RVT  10/07/2015, 11:53 AM

## 2015-10-07 NOTE — Discharge Summary (Signed)
[facility] Discharge Summary  Patient name: Makayla Vasquez Medical record number: [id number] Date of birth: [birth year] Age: 34 y.o. Gender: female Date of Admission: 10/05/2015  Date of Discharge: 10/07/2015  Admitting Physician: Leeanne Rio, MD  Primary Care Provider: Nobie Putnam, DO Consultants: None  Indication for Hospitalization: Dyspnea and cough  Discharge Diagnoses/Problem List:  Patient Active Problem List   Diagnosis Date Noted  . Calf pain   . Congestive heart failure ([place])   . URI (upper respiratory infection) 08/31/2015  . Adrenal insufficiency ([place])   . Acute on chronic diastolic CHF (congestive heart failure) ([place])   . Arterial hypotension   . SOB (shortness of breath)   . Lupus (systemic lupus erythematosus) ([place])   . Congenital heart block   . Chronic diastolic congestive heart failure ([place])   . Hypotension 07/08/2015  . Fatigue 06/05/2015  . Nausea with vomiting 06/05/2015  . Hyperlipidemia associated with type 2 diabetes mellitus ([place]) 05/04/2015  . Polyarthralgia 04/26/2015  . Rhinosinusitis 04/19/2015  . Screening for cervical cancer 01/19/2015  . Onychomycosis of right great toe 12/14/2014  . Discoloration of skin 12/01/2014  . Lip abscess 11/02/2014  . CHF (congestive heart failure) ([place])   . Diabetes mellitus type 2 in obese ([place])   . Hyperglycemia 10/24/2014  . Dehydration   . Sinus tachycardia ([place])   . Chronic respiratory failure with hypoxia ([place]) 08/07/2014  . Atrial tachycardia ([place]) 07/26/2014  . Insertion of implantable subdermal contraceptive 07/12/2014  . Acute respiratory failure with hypoxia ([place]) 06/19/2014  . ILD (interstitial lung disease) ([place]) 06/19/2014  . Pulmonary infiltrates 06/17/2014  . Dyspnea 06/16/2014  . Hypoxemia 06/16/2014  . Congestive dilated cardiomyopathy ([place]) 05/31/2014  . Nonallergic rhinitis 05/22/2014  . Vaginal itching 05/22/2014  . Pneumonia 05/09/2014   . Shortness of breath 05/08/2014  . Sterilization consult 04/20/2014  . Breast tenderness 01/18/2014  . Carpal tunnel syndrome 09/10/2012  . Constipation 03/03/2012  . Chronic systolic heart failure ([place]) 02/26/2012  . Chest pain, unspecified 12/29/2011  . Post-nasal drip 10/24/2011  . Contraception 01/29/2011  . Heart palpitations   . PPM-Medtronic   . Congenital complete AV block   . Bipolar affective disorder ([place])   . Obesity   . GERD (gastroesophageal reflux disease)   . Hypertension 01/05/2011  . THYROGLOSSAL DUCT CYST 07/18/2010  . UNSPECIFIED CARDIAC DYSRHYTHMIA 05/07/2010  . RH FACTOR, NEGATIVE 04/08/2010  . Mild intermittent asthma without complication 32/54/9826  . 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 HEART BLOCK 12/17/2006  . IRRITABLE BOWEL SYNDROME 12/17/2006     Disposition: Home  Discharge Condition: Stable  Discharge Exam:   Objective: Temp: [97.7 F (36.5 C)-97.9 F (36.6 C)] 97.8 F (36.6 C) (12/18 0442) Pulse Rate: [60-94] 60 (12/18 0442) Resp: [18-20] 18 (12/18 0442) BP: (86-118)/(28-57) 86/50 mmHg (12/18 0442) SpO2: [98 %-100 %] 100 % (12/18 0442) Weight: [183 lb 3.2 oz (83.1 kg)] 183 lb 3.2 oz (83.1 kg) (12/18 0442) Physical Exam: Gen: NAD, Well appearing Heart:RRR, without murmur Lungs: clear to auscultation bilaterally. Normal work of breathing. Nasal canula in place Neuro: alert, grossly nonfocal Ext: No erythema, tenderness or swelling.  Brief Hospital Course:  34 y/o female presented with dyspnea and cough . PMH is significant for SLE, pacemaker, Type II DM, dilated cardiomyopathy, CHF, congenital complete AV block, thyroglossal duct cyst, GERD, bipolar disorder, HTN, and ILD.   On admission she was noted to have an oxygen requirement at her baseline 4 L, CXR significant for  stably enlarged heart silhouette. As she does take chronic steroids, Prednisone 10 mg daily for ILD, there was concern she may require stress dose steroids. She was  mildly hypotensive on admission causing concern for adrenal insufficiency. She was stared on stress dose Solucortef. AM cortisol levels however, where nornal so she was transitioned back to her home prednisone regimen. Her blood pressures remained low, but she had no symptoms associated. Looking back through the chart her blood pressures have chronically been low. Her blood presure medications as well as lasix for her HF were held during this time. She had no signs of fluid overload. She was discharged and instructed to not take them until she goes to her follow up appointment  She did significantly have RLE pain without swelling that resolved during her hospitalization. Right lower extremity doppler was negative for clot  The remainder of her chronic conditions remained stable.   Issues for Follow Up:  1. Blood pressure ans re initiation of home antihypertensive regimen, bisoprolol, cozaar, Lasix 2. CHF- Lasix was held inpatient and she was instructed to hold until follow up on 12/20  Significant Procedures:   Significant Labs and Imaging:   Recent Labs Lab 10/05/15 1715  WBC 8.1  HGB 10.8*  HCT 35.7*  PLT 356    Recent Labs Lab 10/05/15 1715 10/06/15 0530  NA 136 140  K 5.7* 3.5  CL 104 108  CO2 24 25  GLUCOSE 133* 133*  BUN 11 8  CREATININE 0.74 0.67  CALCIUM 8.6* 7.9*  ALKPHOS 51  --   AST 103*  --   ALT 60*  --   ALBUMIN 2.3*  --     Results/Tests Pending at Time of Discharge: None  Discharge Medications:    Medication List    STOP taking these medications        amoxicillin 500 MG capsule  Commonly known as:  AMOXIL     bisoprolol 5 MG tablet  Commonly known as:  ZEBETA     furosemide 20 MG tablet  Commonly known as:  LASIX     losartan 25 MG tablet  Commonly known as:  COZAAR      TAKE these medications        albuterol 108 (90 BASE) MCG/ACT inhaler  Commonly known as:  PROVENTIL HFA;VENTOLIN HFA  Inhale 2 puffs into the lungs every 4 (four)  hours as needed for wheezing or shortness of breath.     atorvastatin 40 MG tablet  Commonly known as:  LIPITOR  Take 1 tablet (40 mg total) by mouth daily.     budesonide-formoterol 80-4.5 MCG/ACT inhaler  Commonly known as:  SYMBICORT  Inhale 2 puffs into the lungs 2 (two) times daily as needed (for shortness of breath).     calcium-vitamin D 500-200 MG-UNIT tablet  Commonly known as:  OSCAL WITH D  Take 1 tablet by mouth daily with breakfast.     fluocinonide 0.05 % external solution  Commonly known as:  LIDEX  1 APPLICATION APPLY ON THE SKIN AS DIRECTED APPLY TO AFFECTED AREAS DAILY ON SCALP     GLUCERNA ADVANCE SHAKE Liqd  Take supplement daily in place of 1 meal.     glucose blood test strip  Commonly known as:  ONE TOUCH ULTRA TEST  Check blood sugar daily before breakfast     Insulin Pen Needle 32G X 4 MM Misc  Commonly known as:  INSUPEN PEN NEEDLES  Use with insulin pen  LANTUS SOLOSTAR 100 UNIT/ML Solostar Pen  Generic drug:  Insulin Glargine  Inject 35 Units into the skin daily at 10 pm.     metFORMIN 1000 MG tablet  Commonly known as:  GLUCOPHAGE  TAKE 1 TABLET BY MOUTH TWICE A DAY WITH A MEAL     mycophenolate 500 MG tablet  Commonly known as:  CELLCEPT  Take 500 mg by mouth 2 (two) times daily.     NEXPLANON 68 MG Impl implant  Generic drug:  etonogestrel  1 each by Subdermal route once.     ONE TOUCH ULTRA 2 W/DEVICE Kit  Check blood sugar daily before breakfast     oxyCODONE-acetaminophen 5-325 MG tablet  Commonly known as:  PERCOCET  Take 1 tablet by mouth every 6 (six) hours as needed for severe pain.     pantoprazole 40 MG tablet  Commonly known as:  PROTONIX  TAKE ONE TABLET BY MOUTH ONCE DAILY     potassium chloride SA 20 MEQ tablet  Commonly known as:  KLOR-CON M20  Take 1 tablet (20 mEq total) by mouth daily.     predniSONE 20 MG tablet  Commonly known as:  DELTASONE  Take 0.5 tablets (10 mg total) by mouth daily with breakfast.         Discharge Instructions: Please refer to Patient Instructions section of EMR for full details.  Patient was counseled important signs and symptoms that should prompt return to medical care, changes in medications, dietary instructions, activity restrictions, and follow up appointments.   Follow-Up Appointments:     Follow-up Information    Follow up with Tawanna Sat, MD On 10/09/2015.   Specialty:  Family Medicine   Why:  2:45 pm   Contact information:   [place] [address] [phone]       Davi Rotan A Christne Platts, MD 10/07/2015, 1:35 PM PGY-2, [place]

## 2015-10-09 ENCOUNTER — Ambulatory Visit: Payer: Medicare Other | Admitting: Family Medicine

## 2015-10-09 ENCOUNTER — Ambulatory Visit (HOSPITAL_COMMUNITY): Admission: RE | Admit: 2015-10-09 | Payer: Medicare Other | Source: Ambulatory Visit

## 2015-10-10 LAB — CULTURE, BLOOD (ROUTINE X 2)
CULTURE: NO GROWTH
CULTURE: NO GROWTH

## 2015-10-11 ENCOUNTER — Encounter: Payer: Self-pay | Admitting: Internal Medicine

## 2015-10-15 ENCOUNTER — Ambulatory Visit (HOSPITAL_COMMUNITY): Admission: RE | Admit: 2015-10-15 | Payer: Medicare Other | Source: Ambulatory Visit

## 2015-10-16 ENCOUNTER — Ambulatory Visit: Payer: Medicare Other | Admitting: Family Medicine

## 2015-10-16 ENCOUNTER — Telehealth (HOSPITAL_COMMUNITY): Payer: Self-pay | Admitting: Vascular Surgery

## 2015-10-17 ENCOUNTER — Telehealth: Payer: Self-pay | Admitting: Family Medicine

## 2015-10-17 DIAGNOSIS — M2550 Pain in unspecified joint: Secondary | ICD-10-CM

## 2015-10-17 NOTE — Telephone Encounter (Signed)
Pt calling for a referral to [facility]. Would like Dr. Eulah Pont. Sadie Reynolds, ASA

## 2015-10-18 NOTE — Telephone Encounter (Signed)
Patient cannot move her legs or arms, feels like the prednisone is "eating at her joints, same thing that happened to her mother." Makayla Vasquez, ASA

## 2015-10-18 NOTE — Telephone Encounter (Signed)
Reviewed chart and last note. Referral for second opinion [facility] on chronic polyarthralgias and chronic pain with recent diagnosis of suspected rheumatologic disorder, possibly lupus, followed by Rheumatology (Dr Dossie [facility]) with work-up +ANA SSA JO-1, ESR 55, CRP 73, normal CBC, neg RF, normal ACE & ANCA, ILD dx 2014, taking prednisone 40m daily and cellcept.  [place]obie Putnam [place] PGY-3

## 2015-10-22 DIAGNOSIS — M25562 Pain in left knee: Secondary | ICD-10-CM | POA: Diagnosis not present

## 2015-10-22 DIAGNOSIS — M25551 Pain in right hip: Secondary | ICD-10-CM | POA: Diagnosis not present

## 2015-10-22 DIAGNOSIS — M25561 Pain in right knee: Secondary | ICD-10-CM | POA: Diagnosis not present

## 2015-10-22 DIAGNOSIS — M25552 Pain in left hip: Secondary | ICD-10-CM | POA: Diagnosis not present

## 2015-10-23 ENCOUNTER — Encounter: Payer: Medicare Other | Admitting: Rehabilitation

## 2015-10-23 ENCOUNTER — Ambulatory Visit (HOSPITAL_COMMUNITY): Admission: RE | Admit: 2015-10-23 | Payer: Medicare Other | Source: Ambulatory Visit

## 2015-10-23 NOTE — Therapy (Signed)
[facility] [address] [place], [place], [address] Phone: [phone]   Fax:  [phone]  Patient Details  Name: Makayla Vasquez MRN: [id number] Date of Birth: 1981/04/18 Referring Provider:  No ref. provider found  Encounter Date: 10/23/2015   PHYSICAL THERAPY DISCHARGE SUMMARY  Visits from Start of Care: 3  Current functional level related to goals / functional outcomes: PT Long Term Goals - 08/17/15 1040    PT LONG TERM GOAL #1   Title Pt will be independent with final HEP to indicate compliance with HEP for functional improvement in strength. (Target Date: 09/05/15)   Baseline dependent   Time 6   Period Weeks   PT LONG TERM GOAL #2   Title Pt will increase gait speed to 2.51 ft/sec to indicate more efficient gait and decreased fall risk. (Target Date: 09/05/15)   Baseline 1.91 ft/sec on 07/25/15   Time 6   Period Weeks   PT LONG TERM GOAL #3   Title Pt will increase 6MWT distance by 150' in order to indicate functional improvement in endurance. (Target Date: 09/05/15)   Baseline 793' on 07/25/15   Time 6   Period Weeks   PT LONG TERM GOAL #4   Title Pt will ambulate >500' on uneven outdoor surfaces without AD at mod I level without LOB to indicate safe negotiation in community. (Target Date: 09/05/15)   Baseline Not assessed at this time   Time 6   Period Weeks   PT LONG TERM GOAL #5   Title Pt will climb 8 stairs with single rail in alternating pattern to indicate improvement in functional LE strength. (Target Date: 09/05/15)            Remaining deficits: Unsure as she did not return for follow up visits.    Education / Equipment: HEP  Plan: Patient agrees to discharge.  Patient goals were not met. Patient is being discharged due to not returning since the last visit.  ?????              Cameron Sprang, PT, MPT [facility] [address] [place], [place], [address] Phone: [phone]   Fax:  [phone] 10/23/2015, 9:42 AM

## 2015-10-25 NOTE — Telephone Encounter (Signed)
Left pt message to call back to reschedule echo

## 2015-10-26 ENCOUNTER — Ambulatory Visit (HOSPITAL_COMMUNITY)
Admission: RE | Admit: 2015-10-26 | Discharge: 2015-10-26 | Disposition: A | Discharge status: Home / Self Care | Payer: Medicare Other | Source: Ambulatory Visit | Attending: Cardiology | Admitting: Cardiology

## 2015-10-26 DIAGNOSIS — R000 Tachycardia, unspecified: Secondary | ICD-10-CM | POA: Diagnosis not present

## 2015-10-26 DIAGNOSIS — E119 Type 2 diabetes mellitus without complications: Secondary | ICD-10-CM | POA: Diagnosis not present

## 2015-10-26 DIAGNOSIS — I5022 Chronic systolic (congestive) heart failure: Secondary | ICD-10-CM | POA: Diagnosis not present

## 2015-10-26 DIAGNOSIS — I10 Essential (primary) hypertension: Secondary | ICD-10-CM | POA: Insufficient documentation

## 2015-10-26 DIAGNOSIS — I340 Nonrheumatic mitral (valve) insufficiency: Secondary | ICD-10-CM | POA: Insufficient documentation

## 2015-10-26 DIAGNOSIS — I517 Cardiomegaly: Secondary | ICD-10-CM | POA: Insufficient documentation

## 2015-10-26 NOTE — Progress Notes (Signed)
  Echocardiogram 2D Echocardiogram has been performed.  Nolon Rod 10/26/2015, 10:34 AM

## 2015-10-30 ENCOUNTER — Other Ambulatory Visit: Payer: Self-pay | Admitting: *Deleted

## 2015-10-30 DIAGNOSIS — E669 Obesity, unspecified: Principal | ICD-10-CM

## 2015-10-30 DIAGNOSIS — I509 Heart failure, unspecified: Secondary | ICD-10-CM

## 2015-10-30 DIAGNOSIS — I5032 Chronic diastolic (congestive) heart failure: Secondary | ICD-10-CM

## 2015-10-30 DIAGNOSIS — E1169 Type 2 diabetes mellitus with other specified complication: Secondary | ICD-10-CM

## 2015-10-31 ENCOUNTER — Other Ambulatory Visit (HOSPITAL_COMMUNITY): Payer: Self-pay | Admitting: *Deleted

## 2015-10-31 MED ORDER — FUROSEMIDE 20 MG PO TABS: 20.0000 mg | ORAL_TABLET | Freq: Every day | ORAL | Status: DC

## 2015-10-31 MED ORDER — INSULIN GLARGINE 100 UNIT/ML SOLOSTAR PEN
35.0000 [IU] | PEN_INJECTOR | Freq: Every day | SUBCUTANEOUS | Status: DC
Start: 2015-10-31 — End: 2015-12-26

## 2015-11-01 ENCOUNTER — Other Ambulatory Visit (HOSPITAL_COMMUNITY): Payer: Self-pay | Admitting: *Deleted

## 2015-11-06 ENCOUNTER — Telehealth: Payer: Self-pay | Admitting: *Deleted

## 2015-11-06 NOTE — Telephone Encounter (Signed)
Called patient to offer flu vaccine. Patient states she received flu vaccine at the pulmonologist's office (Dr. Maple Hudson) on 08/20/2015. Added to historical immunization record. Fredderick Severance, RN

## 2015-11-10 ENCOUNTER — Other Ambulatory Visit (HOSPITAL_COMMUNITY): Payer: Self-pay | Admitting: Internal Medicine

## 2015-11-14 ENCOUNTER — Telehealth: Payer: Self-pay | Admitting: *Deleted

## 2015-11-14 NOTE — Telephone Encounter (Signed)
Samantha from Dr. Christin Fudge office (oral surgery) calling because pt is supposed to have some dental work done but they need a letter from the MD verifying that pt is medically cleared to have this surgery.  They do not have a form to fill out, normally the "letter" is on a script and faxed to them [phone]). Niah Heinle, Maryjo [place]

## 2015-11-15 NOTE — Telephone Encounter (Signed)
I have reviewed the request by Dr Randa Evens office for pre-op medical clearance for dental work. Given the patient's significant complex medical history with cardiopulmonary disease, I do not feel comfortable clearing her for surgery at this time that would involve anesthesia. Given her Cardiac history with Chronic combined systolic / diastolic CHF, Congenital high grade heart block s/p biventricular pacemaker, I have requested that Cardiac clearance request be sent to [facility] Cardiologist, Dr Lewayne Bunting. Additionally for pulmonary clearance, I advised that they contact [facility] Dr Jetty Duhamel, given her history of Chronic Interstitial Lung Disease with prior respiratory failure and required O2 therapy.  This message was all given to [facility] at Dr Randa Evens office who agreed to contact these respective offices for inquiry on cardiac / pulmonary clearance pre-op for future dental work.  Saralyn Pilar, DO [facility] Family Medicine, PGY-3

## 2015-11-30 ENCOUNTER — Encounter: Payer: Medicare Other | Admitting: Family Medicine

## 2015-12-04 NOTE — Telephone Encounter (Signed)
Makayla Vasquez from Dr. Randa Evens office stating she did not receive a call back regarding the medical clearance for dental surgery.  Read message from Dr. Althea Charon stating that he was not going to clear patient at this time for surgery that would involve anesthesia.  Makayla Vasquez called and informed the patient, but patient told her that her she is having a teeth pain.  They are requesting now local nitrous, not putting patient to sleep for the surgery.  Please give her a call at [phone].  Makayla Pu, RN

## 2015-12-05 ENCOUNTER — Encounter: Payer: Self-pay | Admitting: Family Medicine

## 2015-12-05 NOTE — Telephone Encounter (Signed)
Called back Dr Randa Evens Oral Surgery office today, spoke with [facility]. She stated that they contacted patient's Cardiologist, who would also NOT clear her for general anesthesia. They discussed with patient, and she opts to try multiple tooth extractions while awake and with local anesthesia only. Now office is requesting medical clearance to proceed without general anesthesia and only local. Letter written and to be faxed today to Dr Randa Evens Office (Fax [phone]). See letter dated 12/05/15 for further details.  Saralyn Pilar, DO [facility] Family Medicine, PGY-3

## 2015-12-06 ENCOUNTER — Telehealth: Payer: Self-pay | Admitting: Family Medicine

## 2015-12-06 NOTE — Telephone Encounter (Signed)
EMERGENCY PHONE LINE  Contacted by [name] on [employer]. States she has had worsening vomiting and diarrhea for the last 3-4 days. Reports occasional fevers, highest measured 100.29F. Denies shortness of breath or chest pain. States mucous membranes are still wet. Denies sick contacts. Reports dizziness with rising. Denies hypoglycemia. Decreased appetite, stating foods just don't taste good. Has noted increasing weakness over the last month, stating she has trouble ambulating and only walks to the bathroom.   Appointment scheduled at [facility] for 2/17 with Dr. Alanda Slim at 2pm. States she doesn't know if she can wait that long and will go to the ED.  Dr. Caroleen Hamman 12/06/15, 5:45 PM

## 2015-12-07 ENCOUNTER — Ambulatory Visit: Payer: Medicare Other | Admitting: Student

## 2015-12-09 ENCOUNTER — Emergency Department (HOSPITAL_COMMUNITY)
Admission: EM | Admit: 2015-12-09 | Discharge: 2015-12-10 | Disposition: A | Discharge status: Home / Self Care | Payer: Medicare Other | Attending: Emergency Medicine | Admitting: Emergency Medicine

## 2015-12-09 ENCOUNTER — Emergency Department (HOSPITAL_COMMUNITY): Payer: Medicare Other

## 2015-12-09 ENCOUNTER — Encounter (HOSPITAL_COMMUNITY): Payer: Self-pay | Admitting: Emergency Medicine

## 2015-12-09 DIAGNOSIS — R0602 Shortness of breath: Secondary | ICD-10-CM | POA: Diagnosis present

## 2015-12-09 DIAGNOSIS — E119 Type 2 diabetes mellitus without complications: Secondary | ICD-10-CM | POA: Insufficient documentation

## 2015-12-09 DIAGNOSIS — I10 Essential (primary) hypertension: Secondary | ICD-10-CM | POA: Diagnosis not present

## 2015-12-09 DIAGNOSIS — Z8719 Personal history of other diseases of the digestive system: Secondary | ICD-10-CM | POA: Insufficient documentation

## 2015-12-09 DIAGNOSIS — Z8659 Personal history of other mental and behavioral disorders: Secondary | ICD-10-CM | POA: Diagnosis not present

## 2015-12-09 DIAGNOSIS — R000 Tachycardia, unspecified: Secondary | ICD-10-CM | POA: Insufficient documentation

## 2015-12-09 DIAGNOSIS — Z7952 Long term (current) use of systemic steroids: Secondary | ICD-10-CM | POA: Insufficient documentation

## 2015-12-09 DIAGNOSIS — Z794 Long term (current) use of insulin: Secondary | ICD-10-CM | POA: Insufficient documentation

## 2015-12-09 DIAGNOSIS — E669 Obesity, unspecified: Secondary | ICD-10-CM | POA: Insufficient documentation

## 2015-12-09 DIAGNOSIS — M069 Rheumatoid arthritis, unspecified: Secondary | ICD-10-CM | POA: Diagnosis not present

## 2015-12-09 DIAGNOSIS — I509 Heart failure, unspecified: Secondary | ICD-10-CM | POA: Insufficient documentation

## 2015-12-09 DIAGNOSIS — Z3202 Encounter for pregnancy test, result negative: Secondary | ICD-10-CM | POA: Diagnosis not present

## 2015-12-09 DIAGNOSIS — Z79899 Other long term (current) drug therapy: Secondary | ICD-10-CM | POA: Insufficient documentation

## 2015-12-09 DIAGNOSIS — Z9889 Other specified postprocedural states: Secondary | ICD-10-CM | POA: Insufficient documentation

## 2015-12-09 DIAGNOSIS — R112 Nausea with vomiting, unspecified: Secondary | ICD-10-CM | POA: Diagnosis not present

## 2015-12-09 DIAGNOSIS — R05 Cough: Secondary | ICD-10-CM | POA: Diagnosis not present

## 2015-12-09 DIAGNOSIS — Z950 Presence of cardiac pacemaker: Secondary | ICD-10-CM | POA: Diagnosis not present

## 2015-12-09 DIAGNOSIS — Z7984 Long term (current) use of oral hypoglycemic drugs: Secondary | ICD-10-CM | POA: Diagnosis not present

## 2015-12-09 DIAGNOSIS — Z87891 Personal history of nicotine dependence: Secondary | ICD-10-CM | POA: Insufficient documentation

## 2015-12-09 DIAGNOSIS — R197 Diarrhea, unspecified: Secondary | ICD-10-CM | POA: Insufficient documentation

## 2015-12-09 DIAGNOSIS — J9611 Chronic respiratory failure with hypoxia: Secondary | ICD-10-CM

## 2015-12-09 DIAGNOSIS — M1990 Unspecified osteoarthritis, unspecified site: Secondary | ICD-10-CM | POA: Insufficient documentation

## 2015-12-09 DIAGNOSIS — Z8701 Personal history of pneumonia (recurrent): Secondary | ICD-10-CM | POA: Insufficient documentation

## 2015-12-09 DIAGNOSIS — J45901 Unspecified asthma with (acute) exacerbation: Secondary | ICD-10-CM | POA: Diagnosis not present

## 2015-12-09 LAB — BASIC METABOLIC PANEL
Anion gap: 15 (ref 5–15)
BUN: 16 mg/dL (ref 6–20)
CHLORIDE: 95 mmol/L — AB (ref 101–111)
CO2: 18 mmol/L — AB (ref 22–32)
CREATININE: 0.98 mg/dL (ref 0.44–1.00)
Calcium: 8.2 mg/dL — ABNORMAL LOW (ref 8.9–10.3)
GFR calc Af Amer: >60 mL/min (ref >60)
GFR calc non Af Amer: >60 mL/min (ref >60)
Glucose, Bld: 163 mg/dL — ABNORMAL HIGH (ref 65–99)
Potassium: 4.5 mmol/L (ref 3.5–5.1)
Sodium: 128 mmol/L — ABNORMAL LOW (ref 135–145)

## 2015-12-09 LAB — CBC
HCT: 35.1 % — ABNORMAL LOW (ref 36.0–46.0)
Hemoglobin: 10.5 g/dL — ABNORMAL LOW (ref 12.0–15.0)
MCH: 24.5 pg — AB (ref 26.0–34.0)
MCHC: 29.9 g/dL — ABNORMAL LOW (ref 30.0–36.0)
MCV: 81.8 fL (ref 78.0–100.0)
PLATELETS: 469 10*3/uL — AB (ref 150–400)
RBC: 4.29 MIL/uL (ref 3.87–5.11)
RDW: 17.7 % — AB (ref 11.5–15.5)
WBC: 14.6 10*3/uL — AB (ref 4.0–10.5)

## 2015-12-09 LAB — I-STAT BETA HCG BLOOD, ED (MC, WL, AP ONLY): I-stat hCG, quantitative: <5 m[IU]/mL (ref <5)

## 2015-12-09 LAB — BRAIN NATRIURETIC PEPTIDE: B Natriuretic Peptide: 38.1 pg/mL (ref 0.0–100.0)

## 2015-12-09 LAB — I-STAT TROPONIN, ED: Troponin i, poc: 0 ng/mL (ref 0.00–0.08)

## 2015-12-09 NOTE — ED Notes (Signed)
Pt. reports worsening SOB with productive cough /chest congestion onset today , hypotensive at arrival , denies  chest pain , SOB increases with exertion .

## 2015-12-09 NOTE — ED Provider Notes (Signed)
CSN: [id number]     Arrival date & time 12/09/15  2139 History   By signing my name below, I, Altamease Oiler, attest that this documentation has been prepared under the direction and in the presence of Merryl Hacker, MD. Electronically Signed: Altamease Oiler, ED Scribe. 12/09/2015. 11:41 PM   Chief Complaint  Patient presents with  . Shortness of Breath  . Hypotension   The history is provided by the patient. No language interpreter was used.   MARCELLENE SHIVLEY is a 35 y.o. female with PMHx of asthma, CHF, HTN, DM, and lupus on [employer] who presents to the Emergency Department complaining of a new and worsening productive cough with onset today. Pt states that she had a fever up to 102F at home 5 days ago.  Associated symptoms include SOB on her usual 4 L of oxygen at home, central chest pain, vomiting, diarrhea, and generalized myalgias. Pt denies LE swelling.  No known sick contact. She did have a flu shot this season.   Past Medical History  Diagnosis Date  . Cardiac pacemaker     a. Since age 14 in 43. b. Upgrade to BiV in 2013.  . Congenital complete AV block   . Obesity   . GERD (gastroesophageal reflux disease)   . Asymptomatic LV dysfunction     a. Echo in Dec 2011 with EF 35 to 40%. Felt to be due to paced rhythm. b. EF 25-30% in 07/2014.  . Seizures ([place])     as a child- from high fever  . Anxiety   . Bipolar affective disorder ([place])   . Depression     bipolar  . Carpal tunnel syndrome of right wrist   . Asthma     seasonal allergies   . Arthritis     rheumatoid arthritis- mild, no rheumatology care   . Hypertension   . Pneumonitis     a. a/w hypoxia - inflammatory - large workup 07/2014.  [name] Sinus tachycardia ([place])   . Diabetes mellitus without complication ([place])   . Presence of permanent cardiac pacemaker   . CHF (congestive heart failure) ([place])   . Lupus ([place])   . Lupus (systemic lupus erythematosus) ([place])    Past Surgical History  Procedure Laterality  Date  . Throat surgery  1994    s/p laser treatment  . Cesarean section    . Insert / replace / remove pacemaker      2001  . Cholecystectomy    . Iud removal  11/03/2011    Procedure: INTRAUTERINE DEVICE (IUD) REMOVAL;  Surgeon: [name], MD;  Location: [facility];  Service: Gynecology;  Laterality: N/A;  . Cyst excision  12/10/2012    THYROID  . Thyroglossal duct cyst N/A 12/10/2012    Procedure: REVISION OF THYROGLOSSAL DUCT CYST EXCISION;  Surgeon: [name], MD;  Location: [facility];  Service: ENT;  Laterality: N/A;  Revision of Thyroglossal Duct Cyst Excision  . Nasal fracture surgery      /w plate   . Carpal tunnel with cubital tunnel Right 07/26/2013    Procedure: RIGHT LIMITED OPEN CARPAL TUNNEL RELEASE ,  RIGHT CUBITAL TUNNEL RELEASE, INSITU VERSES ULNAR NERVE DECOMPRESSION AND ANTERIOR TRANSPOSITION;  Surgeon: [name], MD;  Location: [facility];  Service: Orthopedics;  Laterality: Right;  . Video bronchoscopy Bilateral 06/19/2014    Procedure: VIDEO BRONCHOSCOPY WITHOUT FLUORO;  Surgeon: Brand Males, MD;  Location: [facility];  Service: Cardiopulmonary;  Laterality: Bilateral;  . Bi-ventricular pacemaker  upgrade N/A 03/08/2012    Procedure: BI-VENTRICULAR PACEMAKER UPGRADE;  Surgeon: [name], MD;  Location: [facility] C[facility] LAB;  Service: Cardiovascular;  Laterality: N/A;  . Atrial tach ablation N/A 08/14/2014    Procedure: ATRIAL TACH ABLATION;  Surgeon: [name], MD;  Location: [facility] CATH LAB;  Service: Cardiovascular;  Laterality: N/A;  . Right heart catheterization N/A 10/26/2014    Procedure: RIGHT HEART CATH;  Surgeon: [name], MD;  Location: [facility] CATH LAB;  Service: Cardiovascular;  Laterality: N/A;   Family History  Problem Relation Age of Onset  . Heart disease Mother     CHF (no details)  . Hypertension Mother   . Heart disease Father     Murmur  . Heart disease Sister 12     No details.  History of a pacemaker  . Lupus Mother    Social History   Substance Use Topics  . Smoking status: Former Smoker -- 0.25 packs/day for .5 years    Types: Cigarettes    Quit date: 07/25/1996  . Smokeless tobacco: Never Used  . Alcohol Use: No   OB History    Gravida Para Term Preterm AB TAB SAB Ectopic Multiple Living   1 1 1       1      Review of Systems  Constitutional: Positive for fever.  Respiratory: Positive for cough and shortness of breath.   Cardiovascular: Positive for chest pain. Negative for leg swelling.  Gastrointestinal: Positive for nausea, vomiting and diarrhea.  Musculoskeletal: Positive for myalgias.  All other systems reviewed and are negative.  Allergies  Sertraline hcl and Tape  Home Medications   Prior to Admission medications   Medication Sig Start Date End Date Taking? Authorizing Provider  albuterol (PROVENTIL HFA;VENTOLIN HFA) 108 (90 BASE) MCG/ACT inhaler Inhale 2 puffs into the lungs every 4 (four) hours as needed for wheezing or shortness of breath. 06/05/15  Yes Alexander J Karamalegos, DO  atorvastatin (LIPITOR) 40 MG tablet Take 1 tablet (40 mg total) by mouth daily. 05/04/15  Yes Alexander Devin Going, DO  bisoprolol (ZEBETA) 5 MG tablet Take 2.5 mg by mouth daily.   Yes Historical Provider, MD  Blood Glucose Monitoring Suppl (ONE TOUCH ULTRA 2) W/DEVICE KIT Check blood sugar daily before breakfast 10/25/14  Yes Leone Brand, MD  budesonide-formoterol [facility]) 80-4.5 MCG/ACT inhaler Inhale 2 puffs into the lungs 2 (two) times daily as needed (for shortness of breath).  04/06/14  Yes Deneise Lever, MD  calcium-vitamin D (OSCAL WITH D) 500-200 MG-UNIT per tablet Take 1 tablet by mouth daily with breakfast.    Yes Historical Provider, MD  etonogestrel (NEXPLANON) 68 MG IMPL implant 1 each by Subdermal route once.   Yes Historical Provider, MD  fluocinonide (LIDEX) 0.05 % external solution 1 APPLICATION APPLY ON THE SKIN AS DIRECTED APPLY TO AFFECTED AREAS DAILY ON SCALP AS NEEDED FOR ITCHING. 09/28/15  Yes  Historical Provider, MD  furosemide (LASIX) 20 MG tablet Take 1 tablet (20 mg total) by mouth daily. 10/31/15 01/22/16 Yes Alexander J Karamalegos, DO  glucose blood (ONE TOUCH ULTRA TEST) test strip Check blood sugar daily before breakfast 10/25/14  Yes Leone Brand, MD  Insulin Glargine (LANTUS SOLOSTAR) 100 UNIT/ML Solostar Pen Inject 35 Units into the skin daily at 10 pm. Patient taking differently: Inject 35 Units into the skin daily as needed (low blood sugar).  10/31/15  Yes Alexander Devin Going, DO  Insulin Pen Needle (INSUPEN PEN NEEDLES) 32G  X 4 MM MISC Use with insulin pen 07/15/15  Yes Smiley Houseman, MD  metFORMIN (GLUCOPHAGE) 1000 MG tablet TAKE 1 TABLET BY MOUTH TWICE A DAY WITH A MEAL 05/18/15  Yes [name], DO  mycophenolate (CELLCEPT) 500 MG tablet Take 500 mg by mouth 2 (two) times daily.   Yes Historical Provider, MD  Nutritional Supplements (GLUCERNA ADVANCE SHAKE) LIQD Take supplement daily in place of 1 meal. Patient taking differently: Take 1 Can by mouth daily as needed (meal replacement). Take supplement daily in place of 1 meal. 11/03/14  Yes [name], DO  oxyCODONE-acetaminophen (PERCOCET) 5-325 MG tablet Take 1 tablet by mouth every 6 (six) hours as needed for severe pain. 08/23/15  Yes Alexander Devin Going, DO  potassium chloride SA (KLOR-CON M20) 20 MEQ tablet Take 1 tablet (20 mEq total) by mouth daily. 07/24/15  Yes Alexander Devin Going, DO  predniSONE (DELTASONE) 20 MG tablet Take 0.5 tablets (10 mg total) by mouth daily with breakfast. 10/07/15  Yes Alyssa A Haney, MD  predniSONE (DELTASONE) 20 MG tablet Take 3 tablets (60 mg total) by mouth daily with breakfast. 12/10/15   Merryl Hacker, MD   BP 80/58 mmHg  Pulse 108  Temp(Src) 97.8 F (36.6 C) (Oral)  Resp 24  SpO2 100% Physical Exam  Constitutional: She is oriented to person, place, and time. No distress.  Chronically ill-appearing, no acute distress, nasal cannula in  place  HENT:  Head: Normocephalic and atraumatic.  Cardiovascular: Regular rhythm and normal heart sounds.   No murmur heard. Tachycardia  Pulmonary/Chest: Effort normal. No respiratory distress. She has no wheezes.  Coarse breath sounds bilaterally, nasal cannula in place, no acute distress  Abdominal: Soft. Bowel sounds are normal. There is no tenderness. There is no rebound.  Neurological: She is alert and oriented to person, place, and time.  Skin: Skin is warm and dry.  Psychiatric: She has a normal mood and affect.  Nursing note and vitals reviewed.   ED Course  Procedures (including critical care time) DIAGNOSTIC STUDIES: Oxygen Saturation is 100% on RA,  normal by my interpretation.    COORDINATION OF CARE: 11:20 PM Discussed treatment plan which includes lab work, CXR, EKG with pt at bedside and pt agreed to plan.  Labs Review Labs Reviewed  BASIC METABOLIC PANEL - Abnormal; Notable for the following:    Sodium 128 (*)    Chloride 95 (*)    CO2 18 (*)    Glucose, Bld 163 (*)    Calcium 8.2 (*)    All other components within normal limits  CBC - Abnormal; Notable for the following:    WBC 14.6 (*)    Hemoglobin 10.5 (*)    HCT 35.1 (*)    MCH 24.5 (*)    MCHC 29.9 (*)    RDW 17.7 (*)    Platelets 469 (*)    All other components within normal limits  BRAIN NATRIURETIC PEPTIDE  I-STAT TROPOININ, ED  I-STAT BETA HCG BLOOD, ED ([facility], [facility], [facility] ONLY)    Imaging Review Dg Chest 2 View  12/09/2015  CLINICAL DATA:  Shortness of breath, cough, chest congestion and CHF. Initial encounter. EXAM: CHEST  2 VIEW COMPARISON:  Most recent chest imaging radiographs 10/05/2015, CT 07/08/2015 reviewed. FINDINGS: Left-sided pacemaker remains in place. Cardiomegaly, with mild improvement from prior. Persistent but improved central pulmonary edema. No consolidation to suggest pneumonia. No pleural effusion or pneumothorax. Osseous structures are unchanged. IMPRESSION: Mild CHF which  appears chronic, with improvement compared to prior exam. Electronically Signed   By: Jeb Levering M.D.   On: 12/09/2015 23:08   Ct Angio Chest Pe W/cm &/or Wo Cm  12/10/2015  CLINICAL DATA:  Initial evaluation for acute shortness of breath. History of lupus. EXAM: CT ANGIOGRAPHY CHEST WITH CONTRAST TECHNIQUE: Multidetector CT imaging of the chest was performed using the standard protocol during bolus administration of intravenous contrast. Multiplanar CT image reconstructions and MIPs were obtained to evaluate the vascular anatomy. CONTRAST:  54m OMNIPAQUE IOHEXOL 350 MG/ML SOLN COMPARISON:  Prior radiograph from 12/09/2015. FINDINGS: Visualized thyroid gland normal. No pathologically enlarged mediastinal or axillary adenopathy. Mild soft tissue fullness within the bilateral hila similar to previous, which may related to lymphoid tissue. Intrathoracic aorta of normal caliber and appearance. Great vessels within normal limits. Cardiomegaly with noted. Trace pericardial effusion. Left-sided pacemaker in place. Evaluation of the pulmonary arteries somewhat limited by body habitus and motion artifact. Main pulmonary artery measures within normal limits at 2.2 cm in diameter. No central or segmental filling defect to suggest acute pulmonary embolus identified. Re-formatted imaging confirms these findings. Evaluation for small distal emboli sub 1 limited on this examination. Diffuse severe ground-glass attenuation, relatively bilateral and symmetric, with predominance in the mid and lower lungs. Air bronchograms extend from the perihilar regions to the periphery. More nodular densities within the bilateral upper lungs. Possible trace bilateral pleural effusions. These findings are relatively similar relative to most recent CT from 07/08/2015. Visualized upper abdomen demonstrates no acute abnormality. Possible hepatic steatosis. No acute osseous abnormality. No worrisome lytic or blastic osseous lesions.  IMPRESSION: 1. No CT evidence for acute pulmonary embolism. Please note that evaluation for possible small -distal pulmonary emboli somewhat limited due to body habitus and extensive bilateral consolidation within the lungs. 2. Severe bilaterally symmetric basilar predominant ground-glass consolidation with air bronchograms, overall similar in appearance relative to most recent CT from 07/08/2015. Possible underlying chronic lung disease related to interstitial pneumonitis or possibly history of lupus/autoimmune disorder could be considered. 3. Cardiomegaly without definite evidence for pulmonary edema. Electronically Signed   By: BJeannine BogaM.D.   On: 12/10/2015 02:23   I have personally reviewed and evaluated these images and lab results as part of my medical decision-making.   EKG Interpretation   Date/Time:  Sunday December 09 2015 21:50:09 EST Ventricular Rate:  103 PR Interval:  118 QRS Duration: 116 QT Interval:  398 QTC Calculation: 521 R Axis:   -161 Text Interpretation:  Atrial-sensed ventricular-paced rhythm Abnormal ECG  Confirmed by Gerturde Kuba  MD, Cira Deyoe ([id number] on 12/09/2015 11:09:16 PM      MDM   Final diagnoses:  Shortness of breath  Ch[place]    Patient presents with shortness of breath. Reports cough. Fever 5 days ago. Otherwise no infectious symptoms. Noted to be mildly hypotensive with blood pressures in the mid 80s over 60s during her entire ED course. Patient recently had a hospitalization with a similar presentation. At that time she was also noted to be hypertensive. No etiology for her shortness of breath was found with the exception of her known chronic respiratory failure and hypoxia. She chronically ill-appearing but nontoxic. Afebrile here. Mildly tachycardic. No evidence of volume overload on exam. EKG is reassuring.  Basic lab work is notable for leukocytosis to 14. Patient is on chronic prednisone.  Hemoglobin is  stable.  Troponin and BNP are normal. Chest x-ray shows evidence of mild CHF. Patient has a  history of lupus and would be at risk for PE. CT PE study was obtained and shows no evidence of pulmonary embolus. She has evidence of chronic lung disease. No definite pulmonary edema. She was given a 250 mL bolus as she reports some vomiting and is mildly hyponatremic. On recheck, she is on her baseline home O2 requirements. She is in no acute distress. Discussed with patient that there is no definitive etiology. No evidence of pneumonia or other infectious etiology. Suspect leukocytosis is likely secondary to chronic steroid use. Discussed with patient 5 day burst of prednisone. She states that she is currently taking Lasix and her blood pressure medication. I have requested that the patient hold her blood pressure medications until follow-up with her primary physicians.  She is otherwise asymptomatic from a blood pressure standpoint and previously has had similar pressures. She needs close follow-up with her primary and her pulmonary physicians. If she has any new or worsening symptoms she needs to be reevaluated immediately. Patient stated understanding.  After history, exam, and medical workup I feel the patient has been appropriately medically screened and is safe for discharge home. Pertinent diagnoses were discussed with the patient. Patient was given return precautions.   I personally performed the services described in this documentation, which was scribed in my presence. The recorded information has been reviewed and is accurate.    Merryl Hacker, MD 12/10/15 [phone]

## 2015-12-10 ENCOUNTER — Emergency Department (HOSPITAL_COMMUNITY): Payer: Medicare Other

## 2015-12-10 DIAGNOSIS — J9611 Chronic respiratory failure with hypoxia: Secondary | ICD-10-CM | POA: Diagnosis not present

## 2015-12-10 DIAGNOSIS — R0602 Shortness of breath: Secondary | ICD-10-CM | POA: Diagnosis not present

## 2015-12-10 MED ORDER — PREDNISONE 20 MG PO TABS
60.0000 mg | ORAL_TABLET | Freq: Once | ORAL | Status: AC
  Administered 2015-12-10: 60 mg via ORAL
  Filled 2015-12-10: qty 3

## 2015-12-10 MED ORDER — SODIUM CHLORIDE 0.9 % IV BOLUS (SEPSIS)
250.0000 mL | Freq: Once | INTRAVENOUS | Status: AC
  Administered 2015-12-10: 250 mL via INTRAVENOUS

## 2015-12-10 MED ORDER — IOHEXOL 350 MG/ML SOLN
100.0000 mL | Freq: Once | INTRAVENOUS | Status: AC | PRN
  Administered 2015-12-10: 80 mL via INTRAVENOUS

## 2015-12-10 MED ORDER — PREDNISONE 20 MG PO TABS: 60.0000 mg | ORAL_TABLET | Freq: Every day | ORAL | Status: DC

## 2015-12-10 NOTE — Discharge Instructions (Signed)
You were seen today for shortness of breath. You have chronic respiratory failure. There is no obvious blood clots, pneumonia, or any other acute process. If you have worsening symptoms he should be reevaluated immediately. Otherwise she should follow-up with her pulmonologist and her primary physician within within the next 1-2 days.  Shortness of Breath Shortness of breath means you have trouble breathing. It could also mean that you have a medical problem. You should get immediate medical care for shortness of breath. CAUSES   Not enough oxygen in the air such as with high altitudes or a smoke-filled room.  Certain lung diseases, infections, or problems.  Heart disease or conditions, such as angina or heart failure.  Low red blood cells (anemia).  Poor physical fitness, which can cause shortness of breath when you exercise.  Chest or back injuries or stiffness.  Being overweight.  Smoking.  Anxiety, which can make you feel like you are not getting enough air. DIAGNOSIS  Serious medical problems can often be found during your physical exam. Tests may also be done to determine why you are having shortness of breath. Tests may include:  Chest X-rays.  Lung function tests.  Blood tests.  An electrocardiogram (ECG).  An ambulatory electrocardiogram. An ambulatory ECG records your heartbeat patterns over a 24-hour period.  Exercise testing.  A transthoracic echocardiogram (TTE). During echocardiography, sound waves are used to evaluate how blood flows through your heart.  A transesophageal echocardiogram (TEE).  Imaging scans. Your health care provider may not be able to find a cause for your shortness of breath after your exam. In this case, it is important to have a follow-up exam with your health care provider as directed.  TREATMENT  Treatment for shortness of breath depends on the cause of your symptoms and can vary greatly. HOME CARE INSTRUCTIONS   Do not smoke.  Smoking is a common cause of shortness of breath. If you smoke, ask for help to quit.  Avoid being around chemicals or things that may bother your breathing, such as paint fumes and dust.  Rest as needed. Slowly resume your usual activities.  If medicines were prescribed, take them as directed for the full length of time directed. This includes oxygen and any inhaled medicines.  Keep all follow-up appointments as directed by your health care provider. SEEK MEDICAL CARE IF:   Your condition does not improve in the time expected.  You have a hard time doing your normal activities even with rest.  You have any new symptoms. SEEK IMMEDIATE MEDICAL CARE IF:   Your shortness of breath gets worse.  You feel light-headed, faint, or develop a cough not controlled with medicines.  You start coughing up blood.  You have pain with breathing.  You have chest pain or pain in your arms, shoulders, or abdomen.  You have a fever.  You are unable to walk up stairs or exercise the way you normally do. MAKE SURE YOU:  Understand these instructions.  Will watch your condition.  Will get help right away if you are not doing well or get worse.   This information is not intended to replace advice given to you by your health care provider. Make sure you discuss any questions you have with your health care provider.   Document Released: 07/01/2001 Document Revised: 10/11/2013 Document Reviewed: 12/22/2011 Elsevier Interactive Patient Education [employer]

## 2015-12-14 ENCOUNTER — Emergency Department (HOSPITAL_COMMUNITY): Payer: Medicare Other

## 2015-12-14 ENCOUNTER — Inpatient Hospital Stay (HOSPITAL_COMMUNITY)
Admission: EM | Admit: 2015-12-14 | Discharge: 2015-12-17 | DRG: 872 | Disposition: A | Discharge status: Home / Self Care | Payer: Medicare Other | Attending: Family Medicine | Admitting: Family Medicine

## 2015-12-14 ENCOUNTER — Encounter (HOSPITAL_COMMUNITY): Payer: Self-pay

## 2015-12-14 DIAGNOSIS — Z950 Presence of cardiac pacemaker: Secondary | ICD-10-CM | POA: Insufficient documentation

## 2015-12-14 DIAGNOSIS — I420 Dilated cardiomyopathy: Secondary | ICD-10-CM | POA: Diagnosis present

## 2015-12-14 DIAGNOSIS — J111 Influenza due to unidentified influenza virus with other respiratory manifestations: Secondary | ICD-10-CM | POA: Diagnosis not present

## 2015-12-14 DIAGNOSIS — B9620 Unspecified Escherichia coli [E. coli] as the cause of diseases classified elsewhere: Secondary | ICD-10-CM | POA: Diagnosis present

## 2015-12-14 DIAGNOSIS — I5022 Chronic systolic (congestive) heart failure: Secondary | ICD-10-CM

## 2015-12-14 DIAGNOSIS — I110 Hypertensive heart disease with heart failure: Secondary | ICD-10-CM | POA: Diagnosis present

## 2015-12-14 DIAGNOSIS — Z9981 Dependence on supplemental oxygen: Secondary | ICD-10-CM | POA: Diagnosis not present

## 2015-12-14 DIAGNOSIS — E2740 Unspecified adrenocortical insufficiency: Secondary | ICD-10-CM | POA: Diagnosis present

## 2015-12-14 DIAGNOSIS — I9589 Other hypotension: Secondary | ICD-10-CM | POA: Diagnosis present

## 2015-12-14 DIAGNOSIS — E119 Type 2 diabetes mellitus without complications: Secondary | ICD-10-CM | POA: Diagnosis present

## 2015-12-14 DIAGNOSIS — R002 Palpitations: Secondary | ICD-10-CM | POA: Insufficient documentation

## 2015-12-14 DIAGNOSIS — J849 Interstitial pulmonary disease, unspecified: Secondary | ICD-10-CM | POA: Diagnosis present

## 2015-12-14 DIAGNOSIS — N12 Tubulo-interstitial nephritis, not specified as acute or chronic: Secondary | ICD-10-CM | POA: Diagnosis present

## 2015-12-14 DIAGNOSIS — Z794 Long term (current) use of insulin: Secondary | ICD-10-CM | POA: Diagnosis not present

## 2015-12-14 DIAGNOSIS — A419 Sepsis, unspecified organism: Principal | ICD-10-CM | POA: Diagnosis present

## 2015-12-14 DIAGNOSIS — F319 Bipolar disorder, unspecified: Secondary | ICD-10-CM | POA: Diagnosis present

## 2015-12-14 DIAGNOSIS — Z7952 Long term (current) use of systemic steroids: Secondary | ICD-10-CM

## 2015-12-14 DIAGNOSIS — E785 Hyperlipidemia, unspecified: Secondary | ICD-10-CM | POA: Diagnosis present

## 2015-12-14 DIAGNOSIS — B9629 Other Escherichia coli [E. coli] as the cause of diseases classified elsewhere: Secondary | ICD-10-CM | POA: Diagnosis not present

## 2015-12-14 DIAGNOSIS — I959 Hypotension, unspecified: Secondary | ICD-10-CM | POA: Diagnosis not present

## 2015-12-14 DIAGNOSIS — M1990 Unspecified osteoarthritis, unspecified site: Secondary | ICD-10-CM | POA: Diagnosis present

## 2015-12-14 DIAGNOSIS — Z87891 Personal history of nicotine dependence: Secondary | ICD-10-CM | POA: Diagnosis not present

## 2015-12-14 DIAGNOSIS — Z79899 Other long term (current) drug therapy: Secondary | ICD-10-CM | POA: Diagnosis not present

## 2015-12-14 DIAGNOSIS — R109 Unspecified abdominal pain: Secondary | ICD-10-CM | POA: Diagnosis not present

## 2015-12-14 DIAGNOSIS — Z7984 Long term (current) use of oral hypoglycemic drugs: Secondary | ICD-10-CM

## 2015-12-14 DIAGNOSIS — R1084 Generalized abdominal pain: Secondary | ICD-10-CM | POA: Diagnosis not present

## 2015-12-14 DIAGNOSIS — T380X5A Adverse effect of glucocorticoids and synthetic analogues, initial encounter: Secondary | ICD-10-CM | POA: Diagnosis present

## 2015-12-14 DIAGNOSIS — J45909 Unspecified asthma, uncomplicated: Secondary | ICD-10-CM | POA: Diagnosis present

## 2015-12-14 DIAGNOSIS — N390 Urinary tract infection, site not specified: Secondary | ICD-10-CM

## 2015-12-14 DIAGNOSIS — A084 Viral intestinal infection, unspecified: Secondary | ICD-10-CM

## 2015-12-14 DIAGNOSIS — N10 Acute tubulo-interstitial nephritis: Secondary | ICD-10-CM | POA: Diagnosis not present

## 2015-12-14 DIAGNOSIS — I442 Atrioventricular block, complete: Secondary | ICD-10-CM | POA: Diagnosis not present

## 2015-12-14 DIAGNOSIS — R197 Diarrhea, unspecified: Secondary | ICD-10-CM | POA: Diagnosis not present

## 2015-12-14 LAB — URINALYSIS, ROUTINE W REFLEX MICROSCOPIC
Glucose, UA: NEGATIVE mg/dL
Ketones, ur: NEGATIVE mg/dL
Nitrite: NEGATIVE
Protein, ur: 30 mg/dL — AB
Specific Gravity, Urine: 1.026 (ref 1.005–1.030)
pH: 6 (ref 5.0–8.0)

## 2015-12-14 LAB — GLUCOSE, CAPILLARY: GLUCOSE-CAPILLARY: 198 mg/dL — AB (ref 65–99)

## 2015-12-14 LAB — I-STAT CG4 LACTIC ACID, ED
LACTIC ACID, VENOUS: 0.97 mmol/L (ref 0.5–2.0)
Lactic Acid, Venous: 2.43 mmol/L (ref 0.5–2.0)

## 2015-12-14 LAB — DIFFERENTIAL
Basophils Absolute: 0 10*3/uL (ref 0.0–0.1)
Basophils Relative: 0 %
Eosinophils Absolute: 0 10*3/uL (ref 0.0–0.7)
Eosinophils Relative: 0 %
Lymphocytes Relative: 0 %
Lymphs Abs: 0 10*3/uL — ABNORMAL LOW (ref 0.7–4.0)
Monocytes Absolute: 0 10*3/uL — ABNORMAL LOW (ref 0.1–1.0)
Monocytes Relative: 0 %
Neutro Abs: 0 10*3/uL — ABNORMAL LOW (ref 1.7–7.7)
Neutrophils Relative %: 0 %

## 2015-12-14 LAB — COMPREHENSIVE METABOLIC PANEL
ALT: 25 U/L (ref 14–54)
AST: 59 U/L — ABNORMAL HIGH (ref 15–41)
Albumin: 2.2 g/dL — ABNORMAL LOW (ref 3.5–5.0)
Alkaline Phosphatase: 67 U/L (ref 38–126)
Anion gap: 10 (ref 5–15)
BUN: 11 mg/dL (ref 6–20)
CO2: 23 mmol/L (ref 22–32)
Calcium: 7.8 mg/dL — ABNORMAL LOW (ref 8.9–10.3)
Chloride: 101 mmol/L (ref 101–111)
Creatinine, Ser: 0.56 mg/dL (ref 0.44–1.00)
GFR calc Af Amer: >60 mL/min (ref >60)
GFR calc non Af Amer: >60 mL/min (ref >60)
Glucose, Bld: 169 mg/dL — ABNORMAL HIGH (ref 65–99)
Potassium: 4.2 mmol/L (ref 3.5–5.1)
Sodium: 134 mmol/L — ABNORMAL LOW (ref 135–145)
Total Bilirubin: 1.7 mg/dL — ABNORMAL HIGH (ref 0.3–1.2)
Total Protein: 6.5 g/dL (ref 6.5–8.1)

## 2015-12-14 LAB — CBC
HCT: 34.3 % — ABNORMAL LOW (ref 36.0–46.0)
Hemoglobin: 10.3 g/dL — ABNORMAL LOW (ref 12.0–15.0)
MCH: 24.6 pg — ABNORMAL LOW (ref 26.0–34.0)
MCHC: 30 g/dL (ref 30.0–36.0)
MCV: 81.9 fL (ref 78.0–100.0)
Platelets: 396 10*3/uL (ref 150–400)
RBC: 4.19 MIL/uL (ref 3.87–5.11)
RDW: 17.8 % — ABNORMAL HIGH (ref 11.5–15.5)
WBC: 11.1 10*3/uL — ABNORMAL HIGH (ref 4.0–10.5)

## 2015-12-14 LAB — URINE MICROSCOPIC-ADD ON

## 2015-12-14 LAB — CBG MONITORING, ED
Glucose-Capillary: 134 mg/dL — ABNORMAL HIGH (ref 65–99)
Glucose-Capillary: 85 mg/dL (ref 65–99)

## 2015-12-14 LAB — I-STAT BETA HCG BLOOD, ED (MC, WL, AP ONLY): I-stat hCG, quantitative: <5 m[IU]/mL (ref <5)

## 2015-12-14 LAB — BRAIN NATRIURETIC PEPTIDE: B Natriuretic Peptide: 45.7 pg/mL (ref 0.0–100.0)

## 2015-12-14 LAB — LIPASE, BLOOD: Lipase: 20 U/L (ref 11–51)

## 2015-12-14 MED ORDER — ONDANSETRON HCL 4 MG/2ML IJ SOLN: 4.0000 mg | Freq: Four times a day (QID) | INTRAMUSCULAR | Status: DC | PRN

## 2015-12-14 MED ORDER — INSULIN ASPART 100 UNIT/ML ~~LOC~~ SOLN
0.0000 [IU] | Freq: Every day | SUBCUTANEOUS | Status: DC
  Administered 2015-12-16: 2 [IU] via SUBCUTANEOUS

## 2015-12-14 MED ORDER — FENTANYL CITRATE (PF) 100 MCG/2ML IJ SOLN
100.0000 ug | Freq: Once | INTRAMUSCULAR | Status: AC
Start: 2015-12-14 — End: 2015-12-14
  Administered 2015-12-14: 100 ug via INTRAVENOUS
  Filled 2015-12-14: qty 2

## 2015-12-14 MED ORDER — IOHEXOL 300 MG/ML  SOLN
100.0000 mL | Freq: Once | INTRAMUSCULAR | Status: AC | PRN
  Administered 2015-12-14: 100 mL via INTRAVENOUS

## 2015-12-14 MED ORDER — FENTANYL CITRATE (PF) 100 MCG/2ML IJ SOLN: 50.0000 ug | Freq: Once | INTRAMUSCULAR | Status: DC

## 2015-12-14 MED ORDER — INSULIN GLARGINE 100 UNIT/ML SOLOSTAR PEN: 35.0000 [IU] | PEN_INJECTOR | Freq: Every day | SUBCUTANEOUS | Status: DC

## 2015-12-14 MED ORDER — DEXTROSE 5 % IV SOLN
1.0000 g | Freq: Once | INTRAVENOUS | Status: AC
  Administered 2015-12-14: 1 g via INTRAVENOUS
  Filled 2015-12-14: qty 10

## 2015-12-14 MED ORDER — SODIUM CHLORIDE 0.9 % IV BOLUS (SEPSIS)
1000.0000 mL | Freq: Once | INTRAVENOUS | Status: AC
  Administered 2015-12-14: 1000 mL via INTRAVENOUS

## 2015-12-14 MED ORDER — BISOPROLOL FUMARATE 5 MG PO TABS
2.5000 mg | ORAL_TABLET | Freq: Every day | ORAL | Status: DC
  Administered 2015-12-14 – 2015-12-17 (×4): 2.5 mg via ORAL
  Filled 2015-12-14 (×4): qty 1

## 2015-12-14 MED ORDER — SODIUM CHLORIDE 0.9 % IV SOLN: 250.0000 mL | INTRAVENOUS | Status: DC | PRN

## 2015-12-14 MED ORDER — ENOXAPARIN SODIUM 40 MG/0.4ML ~~LOC~~ SOLN
40.0000 mg | SUBCUTANEOUS | Status: DC
  Administered 2015-12-14 – 2015-12-16 (×3): 40 mg via SUBCUTANEOUS
  Filled 2015-12-14 (×3): qty 0.4

## 2015-12-14 MED ORDER — ACETAMINOPHEN 325 MG PO TABS
650.0000 mg | ORAL_TABLET | ORAL | Status: DC | PRN
  Administered 2015-12-14: 650 mg via ORAL
  Filled 2015-12-14: qty 2

## 2015-12-14 MED ORDER — INSULIN GLARGINE 100 UNIT/ML ~~LOC~~ SOLN
35.0000 [IU] | Freq: Every day | SUBCUTANEOUS | Status: DC
  Administered 2015-12-14 – 2015-12-16 (×3): 35 [IU] via SUBCUTANEOUS
  Filled 2015-12-14 (×4): qty 0.35

## 2015-12-14 MED ORDER — MYCOPHENOLATE MOFETIL 250 MG PO CAPS
500.0000 mg | ORAL_CAPSULE | Freq: Two times a day (BID) | ORAL | Status: DC
Start: 2015-12-14 — End: 2015-12-17
  Administered 2015-12-14 – 2015-12-17 (×6): 500 mg via ORAL
  Filled 2015-12-14 (×7): qty 2

## 2015-12-14 MED ORDER — HYDROCORTISONE NA SUCCINATE PF 100 MG IJ SOLR
50.0000 mg | Freq: Two times a day (BID) | INTRAMUSCULAR | Status: DC
  Administered 2015-12-14 – 2015-12-17 (×6): 50 mg via INTRAVENOUS
  Filled 2015-12-14 (×6): qty 2

## 2015-12-14 MED ORDER — DEXTROSE 5 % IV SOLN
1.0000 g | INTRAVENOUS | Status: AC
  Administered 2015-12-14 – 2015-12-16 (×3): 1 g via INTRAVENOUS
  Filled 2015-12-14 (×3): qty 10

## 2015-12-14 MED ORDER — MOMETASONE FURO-FORMOTEROL FUM 100-5 MCG/ACT IN AERO
2.0000 | INHALATION_SPRAY | Freq: Two times a day (BID) | RESPIRATORY_TRACT | Status: DC
  Administered 2015-12-15 – 2015-12-17 (×5): 2 via RESPIRATORY_TRACT
  Filled 2015-12-14: qty 8.8

## 2015-12-14 MED ORDER — INSULIN ASPART 100 UNIT/ML ~~LOC~~ SOLN
0.0000 [IU] | Freq: Three times a day (TID) | SUBCUTANEOUS | Status: DC
  Administered 2015-12-15 (×2): 2 [IU] via SUBCUTANEOUS
  Administered 2015-12-15 – 2015-12-16 (×2): 3 [IU] via SUBCUTANEOUS
  Administered 2015-12-16: 2 [IU] via SUBCUTANEOUS
  Administered 2015-12-16: 3 [IU] via SUBCUTANEOUS
  Administered 2015-12-17: 5 [IU] via SUBCUTANEOUS
  Administered 2015-12-17: 3 [IU] via SUBCUTANEOUS

## 2015-12-14 MED ORDER — IOHEXOL 300 MG/ML  SOLN
25.0000 mL | Freq: Once | INTRAMUSCULAR | Status: AC | PRN
  Administered 2015-12-14: 25 mL via ORAL

## 2015-12-14 MED ORDER — SODIUM CHLORIDE 0.9 % IV SOLN
INTRAVENOUS | Status: AC
  Administered 2015-12-14: 19:00:00 via INTRAVENOUS
  Administered 2015-12-15: 75 mL/h via INTRAVENOUS

## 2015-12-14 NOTE — H&P (Signed)
PULMONARY / CRITICAL CARE MEDICINE   Name: Makayla Vasquez MRN: [id number] DOB: [birth year]    ADMISSION DATE:  12/14/2015 CONSULTATION DATE:  12/14/2015  REFERRING MD:  EDP  CHIEF COMPLAINT:  Nausea vomiting diarrhea  HISTORY OF PRESENT ILLNESS:   This is a 35 year old female with a past medical history significant for systolic heart failure, congenital AV block, lupus on chronic prednisone and immunosuppressive therapy who presented to the [facility] emergency department on 12/14/2015 complaining of one day of nausea vomiting and diarrhea. She also noted 2 days of foul-smelling urine. She has not been able to take any medications or food for the last 24 hours. She denies fevers or chills. She does have crampy abdominal pain. She denies shortness of breath, chest pain, or cough. There is no rash. In the emergency department she was administered 2 L of IV fluids but because her blood pressure was low pulmonary and critical care medicine was asked to admit. At baseline the patient has chronic hypertension. She tells me that the highest she has seen her blood pressure in the last year has been 681 systolic. She says typically she runs in the 80s to 90s.  PAST MEDICAL HISTORY :  She  has a past medical history of Cardiac pacemaker; Congenital complete AV block; Obesity; GERD (gastroesophageal reflux disease); Asymptomatic LV dysfunction; Seizures ([place]); Anxiety; Bipolar affective disorder ([place]); Depression; Carpal tunnel syndrome of right wrist; Asthma; Arthritis; Hypertension; Pneumonitis; Sinus tachycardia ([place]); Diabetes mellitus without complication ([place]); Presence of permanent cardiac pacemaker; CHF (congestive heart failure) ([place]); Lupus ([place]); and Lupus (systemic lupus erythematosus) ([place]).  PAST SURGICAL HISTORY: She  has past surgical history that includes Throat surgery (1994); Cesarean section; Insert / replace / remove pacemaker; Cholecystectomy; IUD removal (11/03/2011); Cyst excision  (12/10/2012); Thyroglossal duct cyst (N/A, 12/10/2012); Nasal fracture surgery; Carpal tunnel with cubital tunnel (Right, 07/26/2013); Video bronchoscopy (Bilateral, 06/19/2014); bi-ventricular pacemaker upgrade (N/A, 03/08/2012); Atrial tach ablation (N/A, 08/14/2014); and right heart catheterization (N/A, 10/26/2014).  Allergies  Allergen Reactions  . Sertraline Hcl Hives  . Tape Other (See Comments)    Burns skin    No current facility-administered medications on file prior to encounter.   Current Outpatient Prescriptions on File Prior to Encounter  Medication Sig  . albuterol (PROVENTIL HFA;VENTOLIN HFA) 108 (90 BASE) MCG/ACT inhaler Inhale 2 puffs into the lungs every 4 (four) hours as needed for wheezing or shortness of breath.  [name] atorvastatin (LIPITOR) 40 MG tablet Take 1 tablet (40 mg total) by mouth daily.  . bisoprolol (ZEBETA) 5 MG tablet Take 2.5 mg by mouth daily.  . budesonide-formoterol (SYMBICORT) 80-4.5 MCG/ACT inhaler Inhale 2 puffs into the lungs 2 (two) times daily as needed (for shortness of breath).   . calcium-vitamin D (OSCAL WITH D) 500-200 MG-UNIT per tablet Take 1 tablet by mouth daily with breakfast.   . etonogestrel (NEXPLANON) 68 MG IMPL implant 1 each by Subdermal route once.  . fluocinonide (LIDEX) 0.05 % external solution 1 APPLICATION APPLY ON THE SKIN AS DIRECTED APPLY TO AFFECTED AREAS DAILY ON SCALP AS NEEDED FOR ITCHING.  . furosemide (LASIX) 20 MG tablet Take 1 tablet (20 mg total) by mouth daily.  . Insulin Glargine (LANTUS SOLOSTAR) 100 UNIT/ML Solostar Pen Inject 35 Units into the skin daily at 10 pm. (Patient taking differently: Inject 35 Units into the skin daily as needed (low blood sugar). )  . metFORMIN (GLUCOPHAGE) 1000 MG tablet TAKE 1 TABLET BY MOUTH TWICE A DAY WITH A MEAL  .  mycophenolate (CELLCEPT) 500 MG tablet Take 500 mg by mouth 2 (two) times daily.  . Nutritional Supplements (GLUCERNA ADVANCE SHAKE) LIQD Take supplement daily in place of 1  meal. (Patient taking differently: Take 1 Can by mouth daily as needed (meal replacement). Take supplement daily in place of 1 meal.)  . oxyCODONE-acetaminophen (PERCOCET) 5-325 MG tablet Take 1 tablet by mouth every 6 (six) hours as needed for severe pain.  . potassium chloride SA (KLOR-CON M20) 20 MEQ tablet Take 1 tablet (20 mEq total) by mouth daily.  . predniSONE (DELTASONE) 20 MG tablet Take 0.5 tablets (10 mg total) by mouth daily with breakfast.  . predniSONE (DELTASONE) 20 MG tablet Take 3 tablets (60 mg total) by mouth daily with breakfast.  . Blood Glucose Monitoring Suppl (ONE TOUCH ULTRA 2) W/DEVICE KIT Check blood sugar daily before breakfast  . glucose blood (ONE TOUCH ULTRA TEST) test strip Check blood sugar daily before breakfast  . Insulin Pen Needle (INSUPEN PEN NEEDLES) 32G X 4 MM MISC Use with insulin pen    FAMILY HISTORY:  Her indicated that her mother is alive. She indicated that her father is alive. She indicated that her sister is alive.   SOCIAL HISTORY: She  reports that she quit smoking about 19 years ago. Her smoking use included Cigarettes. She has a .125 pack-year smoking history. She has never used smokeless tobacco. She reports that she does not drink alcohol or use illicit drugs.  REVIEW OF SYSTEMS:   Gen: Denies fever, chills, weight change, fatigue, night sweats HEENT: Denies blurred vision, double vision, hearing loss, tinnitus, sinus congestion, rhinorrhea, sore throat, neck stiffness, dysphagia PULM: Denies shortness of breath, cough, sputum production, hemoptysis, wheezing CV: Denies chest pain, edema, orthopnea, paroxysmal nocturnal dyspnea, palpitations GI: per HPI GU: Denies dysuria, hematuria, polyuria, oliguria, urethral discharge Endocrine: Denies hot or cold intolerance, polyuria, polyphagia or appetite change Derm: Denies rash, dry skin, scaling or peeling skin change Heme: Denies easy bruising, bleeding, bleeding gums Neuro: Denies headache,  numbness, weakness, slurred speech, loss of memory or consciousness   SUBJECTIVE:  As above  VITAL SIGNS: BP 85/58 mmHg  Pulse 94  Temp(Src) 99.7 F (37.6 C) (Oral)  Resp 16  SpO2 97%  LMP   HEMODYNAMICS:    VENTILATOR SETTINGS:    INTAKE / OUTPUT: I/O last 3 completed shifts: In: 2000 [I.V.:2000] Out: -   PHYSICAL EXAMINATION: General:  Mildly ill-appearing Neuro:  Sleepy but arouses easily, moves all 4 extremities well HEENT:  Normocephalic atraumatic extraocular movements intact Cardiovascular:  Regular rate and rhythm, no obvious murmurs or JVD Lungs:  Lungs clear to auscultation bilaterally with normal effort Abdomen:  Bowel sounds positive, very mild diffuse tenderness Musculoskeletal:  Normal bulk and tone Skin:  Warm and well-perfused, no edema  LABS:  BMET  Recent Labs Lab 12/09/15 2205 12/14/15 0119  NA 128* 134*  K 4.5 4.2  CL 95* 101  CO2 18* 23  BUN 16 11  CREATININE 0.98 0.56  GLUCOSE 163* 169*    Electrolytes  Recent Labs Lab 12/09/15 2205 12/14/15 0119  CALCIUM 8.2* 7.8*    CBC  Recent Labs Lab 12/09/15 2205 12/14/15 0119  WBC 14.6* 11.1*  HGB 10.5* 10.3*  HCT 35.1* 34.3*  PLT 469* 396    Coag's No results for input(s): APTT, INR in the last 168 hours.  Sepsis Markers  Recent Labs Lab 12/14/15 0205  LATICACIDVEN 2.43*    ABG No results for input(s): PHART, PCO2ART, PO2ART  in the last 168 hours.  Liver Enzymes  Recent Labs Lab 12/14/15 0119  AST 59*  ALT 25  ALKPHOS 67  BILITOT 1.7*  ALBUMIN 2.2*    Cardiac Enzymes No results for input(s): TROPONINI, PROBNP in the last 168 hours.  Glucose  Recent Labs Lab 12/14/15 0111  GLUCAP 134*    Imaging Ct Abdomen Pelvis W Contrast  12/14/2015  CLINICAL DATA:  Initial evaluation for generalized abdominal pain. EXAM: CT ABDOMEN AND PELVIS WITH CONTRAST TECHNIQUE: Multidetector CT imaging of the abdomen and pelvis was performed using the standard  protocol following bolus administration of intravenous contrast. CONTRAST:  164m OMNIPAQUE IOHEXOL 300 MG/ML  SOLN COMPARISON:  Prior CT from 02/25/2013. FINDINGS: There are extensive ground-glass opacities with air bronchograms throughout the partially visualized lungs, highly suspicious for possible infection. A component of pulmonary edema may be present as well, although no significant pleural effusion identified. Pacemaker electrodes partially visualized. No pericardial effusion. Liver demonstrates a normal contrast enhanced appearance. Gallbladder absent. No biliary dilatation. Spleen, adrenal glands, and pancreas demonstrate a normal contrast enhanced appearance. Kidneys are equal in size with symmetric enhancement. No nephrolithiasis or hydronephrosis. No focal enhancing renal mass. Stomach within normal limits. No evidence for bowel obstruction. No wall thickening, mucosal enhancement, or inflammatory fat stranding seen about the bowels. Appendix well visualized in the right abdomen and is of normal caliber and appearance without associated inflammatory changes to suggest acute appendicitis. Fluid level present within the lumen of the distal colon/rectum, which may reflect active diarrheal illness. Bladder partially distended but grossly unremarkable. Uterus within normal limits. Ovaries normal for patient age. No free air or fluid. No pathologically enlarged intra-abdominal pelvic lymph nodes. Normal intravascular enhancement seen throughout the intra-abdominal aorta and its branch vessels. No acute osseous abnormality. No worrisome lytic or blastic osseous lesions. 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 IMPRESSION: 1. Fluid level within the lumen of the distal colon, suggesting possible active diarrheal illness. No CT evidence for other acute intra-abdominal or pelvic process. 2. Extensive ground-glass opacities seen throughout the partially visualized lung bases. Findings may reflect sequela of infection or  possibly edema. 3. Status post cholecystectomy. Electronically Signed   By: BJeannine BogaM.D.   On: 12/14/2015 06:21     STUDIES:  12/14/2015 CT abdomen and pelvis: Fluid level within the lumen of the distal colon suggesting active diarrhea no other acute process, extensive groundglass through lung bases possibly edema status post cholecystectomy  CULTURES: 12/14/2015 urine culture  ANTIBIOTICS:  12/14/2015 ceftriaxone  SIGNIFICANT EVENTS:   LINES/TUBES:   DISCUSSION: This is a 35year old female with a past medical history significant for systolic heart failure, lupus, and on chronic immunosuppression who is chronically hypotensive. Pulmonary critical care medicine was called to admit because the blood pressure was in the 80s in the setting of a viral gastroenteritis. A very brief review of her records shows that her baseline blood pressure is in the 893Ato 935Tsystolic. In fact, [facility] blood pressure of 86/50. At this time she has a viral gastroenteritis and cannot keep fluids down so she needs to be admitted, given IV fluids, antibiotics, and careful monitoring. She is not septic. She does have a urinary tract infection which needs to be treated.   ASSESSMENT / PLAN:  PULMONARY: A: No acute issues P:   Monitor O2 saturation with IVF administration  CARDIOVASCULAR A:  Chronic systolic CHF Chronic hypotension, at baseline  P:  Gentle IVF Telemetry monitoring Hold lasix Hold BP meds except b-blocker  RENAL A:   No acute issues P:   Monitor BMET and UOP Replace electrolytes as needed   GASTROINTESTINAL A:   Viral gastroenteritis Nausea,vomiting, diarrhea secondary to gastroenteritis P:   Liquid diet Prn zofran   HEMATOLOGIC A:   No acute issues P:  Monitor for bleeding  INFECTIOUS A:   UTI Immunocompromised P:   Ceftriaxone Continue cellcept  ENDOCRINE A:    DM2 Chronic steroid dependence   P:   Continue Lantus + AC/HS SSI Change prednisone to hydrocortisone  NEUROLOGIC A:   No acute issues P:   APAP prn pain   FAMILY PRACTICE TEACHING SERVICE to pick up on 2/25   Roselie Awkward, MD [place] [facility] Pager: [phone] Cell: [phone] After 3pm or if no response, call [phone]   12/14/2015, 9:09 AM

## 2015-12-14 NOTE — ED Notes (Signed)
Notified PA of patient pressure.

## 2015-12-14 NOTE — ED Notes (Signed)
Bed: PG98 Expected date:  Expected time:  Means of arrival:  Comments: EMS abdominal pain, N/V/D, cough, hx CHF-O2 dependent

## 2015-12-14 NOTE — ED Notes (Signed)
Per Kendrick Fries MD w/ Critical Care, Pt is stable and Med-Surg is sufficient.  Bed request placed for [facility] Med-Surg.

## 2015-12-14 NOTE — ED Notes (Signed)
Patient arrives by EMS with generalized abdominal pain, N/V/D, generalized malaise and illness for the past 2 days

## 2015-12-14 NOTE — Progress Notes (Signed)
[facility] ED CM review this pt with Dr Kendrick Fries Pt seen by [facility] teaching services Pt not a good historian at this time, low BP

## 2015-12-14 NOTE — ED Provider Notes (Signed)
CSN: [id number]     Arrival date & time 12/14/15  0103 History   First MD Initiated Contact with Patient 12/14/15 0111     Chief Complaint  Patient presents with  . Abdominal Pain     (Consider location/radiation/quality/duration/timing/severity/associated sxs/prior Treatment) HPI Comments: Patient presents to the ED with a chief complaint of abdominal pain.  She states that she began having the abdominal pain last night.  She states that it has progressively worsened. She has not tried taking anything for her symptoms. She has felt nauseated but has not had vomiting. She denies hematuria, or vaginal discharge. She states that the pain in her abdomen is all over. Additionally, patient is on home O2 (3 L), this needed to be increased today.    The history is provided by the patient. No language interpreter was used.    Past Medical History  Diagnosis Date  . Cardiac pacemaker     a. Since age 50 in 52. b. Upgrade to BiV in 2013.  . Congenital complete AV block   . Obesity   . GERD (gastroesophageal reflux disease)   . Asymptomatic LV dysfunction     a. Echo in Dec 2011 with EF 35 to 40%. Felt to be due to paced rhythm. b. EF 25-30% in 07/2014.  . Seizures ([place])     as a child- from high fever  . Anxiety   . Bipolar affective disorder ([place])   . Depression     bipolar  . Carpal tunnel syndrome of right wrist   . Asthma     seasonal allergies   . Arthritis     rheumatoid arthritis- mild, no rheumatology care   . Hypertension   . Pneumonitis     a. a/w hypoxia - inflammatory - large workup 07/2014.  [name] Sinus tachycardia ([place])   . Diabetes mellitus without complication ([place])   . Presence of permanent cardiac pacemaker   . CHF (congestive heart failure) ([place])   . Lupus ([place])   . Lupus (systemic lupus erythematosus) ([place])    Past Surgical History  Procedure Laterality Date  . Throat surgery  1994    s/p laser treatment  . Cesarean section    . Insert / replace / remove  pacemaker      2001  . Cholecystectomy    . Iud removal  11/03/2011    Procedure: INTRAUTERINE DEVICE (IUD) REMOVAL;  Surgeon: [name], MD;  Location: [facility];  Service: Gynecology;  Laterality: N/A;  . Cyst excision  12/10/2012    THYROID  . Thyroglossal duct cyst N/A 12/10/2012    Procedure: REVISION OF THYROGLOSSAL DUCT CYST EXCISION;  Surgeon: [name], MD;  Location: [facility];  Service: ENT;  Laterality: N/A;  Revision of Thyroglossal Duct Cyst Excision  . Nasal fracture surgery      /w plate   . Carpal tunnel with cubital tunnel Right 07/26/2013    Procedure: RIGHT LIMITED OPEN CARPAL TUNNEL RELEASE ,  RIGHT CUBITAL TUNNEL RELEASE, INSITU VERSES ULNAR NERVE DECOMPRESSION AND ANTERIOR TRANSPOSITION;  Surgeon: [name], MD;  Location: [facility];  Service: Orthopedics;  Laterality: Right;  . Video bronchoscopy Bilateral 06/19/2014    Procedure: VIDEO BRONCHOSCOPY WITHOUT FLUORO;  Surgeon: Brand Males, MD;  Location: [facility];  Service: Cardiopulmonary;  Laterality: Bilateral;  . Bi-ventricular pacemaker upgrade N/A 03/08/2012    Procedure: BI-VENTRICULAR PACEMAKER UPGRADE;  Surgeon: [name], MD;  Location: [facility] CATH LAB;  Service: Cardiovascular;  Laterality: N/A;  .  Atrial tach ablation N/A 08/14/2014    Procedure: ATRIAL TACH ABLATION;  Surgeon: [name], MD;  Location: [facility] CATH LAB;  Service: Cardiovascular;  Laterality: N/A;  . Right heart catheterization N/A 10/26/2014    Procedure: RIGHT HEART CATH;  Surgeon: [name], MD;  Location: [facility] CATH LAB;  Service: Cardiovascular;  Laterality: N/A;   Family History  Problem Relation Age of Onset  . Heart disease Mother     CHF (no details)  . Hypertension Mother   . Heart disease Father     Murmur  . Heart disease Sister 8     No details.  History of a pacemaker  . Lupus Mother    Social History  Substance Use Topics  . Smoking status: Former Smoker -- 0.25 packs/day for .5 years    Types: Cigarettes     Quit date: 07/25/1996  . Smokeless tobacco: Never Used  . Alcohol Use: No   OB History    Gravida Para Term Preterm AB TAB SAB Ectopic Multiple Living   _0 Review of Systems  All other systems reviewed and are negative.     Allergies  Sertraline hcl and Tape  Home Medications   Prior to Admission medications   Medication Sig Start Date End Date Taking? Authorizing Provider  albuterol (PROVENTIL HFA;VENTOLIN HFA) 108 (90 BASE) MCG/ACT inhaler Inhale 2 puffs into the lungs every 4 (four) hours as needed for wheezing or shortness of breath. 06/05/15   [name], DO  atorvastatin (LIPITOR) 40 MG tablet Take 1 tablet (40 mg total) by mouth daily. 05/04/15   [name], DO  bisoprolol (ZEBETA) 5 MG tablet Take 2.5 mg by mouth daily.    Historical Provider, MD  Blood Glucose Monitoring Suppl (ONE TOUCH ULTRA 2) W/DEVICE KIT Check blood sugar daily before breakfast 10/25/14   Leone Brand, MD  budesonide-formoterol [facility]) 80-4.5 MCG/ACT inhaler Inhale 2 puffs into the lungs 2 (two) times daily as needed (for shortness of breath).  04/06/14   Deneise Lever, MD  calcium-vitamin D (OSCAL WITH D) 500-200 MG-UNIT per tablet Take 1 tablet by mouth daily with breakfast.     Historical Provider, MD  etonogestrel (NEXPLANON) 68 MG IMPL implant 1 each by Subdermal route once.    Historical Provider, MD  fluocinonide (LIDEX) 0.05 % external solution 1 APPLICATION APPLY ON THE SKIN AS DIRECTED APPLY TO AFFECTED AREAS DAILY ON SCALP AS NEEDED FOR ITCHING. 09/28/15   Historical Provider, MD  furosemide (LASIX) 20 MG tablet Take 1 tablet (20 mg total) by mouth daily. 10/31/15 01/22/16  [name], DO  glucose blood (ONE TOUCH ULTRA TEST) test strip Check blood sugar daily before breakfast 10/25/14   Leone Brand, MD  Insulin Glargine (LANTUS SOLOSTAR) 100 UNIT/ML Solostar Pen Inject 35 Units into the skin daily at 10 pm. Patient taking differently:  Inject 35 Units into the skin daily as needed (low blood sugar).  10/31/15   [name], DO  Insulin Pen Needle (INSUPEN PEN NEEDLES) 32G X 4 MM MISC Use with insulin pen 07/15/15   Smiley Houseman, MD  metFORMIN (GLUCOPHAGE) 1000 MG tablet TAKE 1 TABLET BY MOUTH TWICE A DAY WITH A MEAL 05/18/15   [name], DO  mycophenolate (CELLCEPT) 500 MG tablet Take 500 mg by mouth 2 (two) times daily.    Historical Provider, MD  Nutritional Supplements (Saline  ADVANCE SHAKE) LIQD Take supplement daily in place of 1 meal. Patient taking differently: Take 1 Can by mouth daily as needed (meal replacement). Take supplement daily in place of 1 meal. 11/03/14   [name], DO  oxyCODONE-acetaminophen (PERCOCET) 5-325 MG tablet Take 1 tablet by mouth every 6 (six) hours as needed for severe pain. 08/23/15   [name], DO  potassium chloride SA (KLOR-CON M20) 20 MEQ tablet Take 1 tablet (20 mEq total) by mouth daily. 07/24/15   [name], DO  predniSONE (DELTASONE) 20 MG tablet Take 0.5 tablets (10 mg total) by mouth daily with breakfast. 10/07/15   Veatrice Bourbon, MD  predniSONE (DELTASONE) 20 MG tablet Take 3 tablets (60 mg total) by mouth daily with breakfast. 12/10/15   Merryl Hacker, MD   BP 88/59 mmHg  Pulse 87  Temp(Src) 99.7 F (37.6 C) (Oral)  Resp 18  SpO2 90% Physical Exam  Constitutional: She is oriented to person, place, and time. She appears well-developed and well-nourished.  HENT:  Head: Normocephalic and atraumatic.  Eyes: Conjunctivae and EOM are normal. Pupils are equal, round, and reactive to light.  Neck: Normal range of motion. Neck supple.  Cardiovascular: Normal rate and regular rhythm.  Exam reveals no gallop and no friction rub.   No murmur heard. Pulmonary/Chest: Effort normal and breath sounds normal. No respiratory distress. She has no wheezes. She has no rales. She exhibits no tenderness.  Abdominal: Soft.  Bowel sounds are normal. She exhibits no distension and no mass. There is tenderness. There is no rebound and no guarding.  Diffuse abdominal tenderness  Musculoskeletal: Normal range of motion. She exhibits no edema or tenderness.  Neurological: She is alert and oriented to person, place, and time.  Skin: Skin is warm and dry.  Psychiatric: She has a normal mood and affect. Her behavior is normal. Judgment and thought content normal.  Nursing note and vitals reviewed.   ED Course  Procedures (including critical care time) Results for orders placed or performed during the hospital encounter of 12/14/15  Lipase, blood  Result Value Ref Range   Lipase 20 11 - 51 U/L  Comprehensive metabolic panel  Result Value Ref Range   Sodium 134 (L) 135 - 145 mmol/L   Potassium 4.2 3.5 - 5.1 mmol/L   Chloride 101 101 - 111 mmol/L   CO2 23 22 - 32 mmol/L   Glucose, Bld 169 (H) 65 - 99 mg/dL   BUN 11 6 - 20 mg/dL   Creatinine, Ser 0.56 0.44 - 1.00 mg/dL   Calcium 7.8 (L) 8.9 - 10.3 mg/dL   Total Protein 6.5 6.5 - 8.1 g/dL   Albumin 2.2 (L) 3.5 - 5.0 g/dL   AST 59 (H) 15 - 41 U/L   ALT 25 14 - 54 U/L   Alkaline Phosphatase 67 38 - 126 U/L   Total Bilirubin 1.7 (H) 0.3 - 1.2 mg/dL   GFR calc non Af Amer >60 >60 mL/min   GFR calc Af Amer >60 >60 mL/min   Anion gap 10 5 - 15  CBC  Result Value Ref Range   WBC 11.1 (H) 4.0 - 10.5 K/uL   RBC 4.19 3.87 - 5.11 MIL/uL   Hemoglobin 10.3 (L) 12.0 - 15.0 g/dL   HCT 34.3 (L) 36.0 - 46.0 %   MCV 81.9 78.0 - 100.0 fL   MCH 24.6 (L) 26.0 - 34.0 pg   MCHC 30.0 30.0 - 36.0 g/dL  RDW 17.8 (H) 11.5 - 15.5 %   Platelets 396 150 - 400 K/uL  Urinalysis, Routine w reflex microscopic (not at [facility])  Result Value Ref Range   Color, Urine AMBER (A) YELLOW   APPearance CLOUDY (A) CLEAR   Specific Gravity, Urine 1.026 1.005 - 1.030   pH 6.0 5.0 - 8.0   Glucose, UA NEGATIVE NEGATIVE mg/dL   Hgb urine dipstick TRACE (A) NEGATIVE   Bilirubin Urine SMALL (A)  NEGATIVE   Ketones, ur NEGATIVE NEGATIVE mg/dL   Protein, ur 30 (A) NEGATIVE mg/dL   Nitrite NEGATIVE NEGATIVE   Leukocytes, UA MODERATE (A) NEGATIVE  Brain natriuretic peptide  Result Value Ref Range   B Natriuretic Peptide 45.7 0.0 - 100.0 pg/mL  Urine microscopic-add on  Result Value Ref Range   Squamous Epithelial / LPF 0-5 (A) NONE SEEN   WBC, UA TOO NUMEROUS TO COUNT 0 - 5 WBC/hpf   RBC / HPF 6-30 0 - 5 RBC/hpf   Bacteria, UA MANY (A) NONE SEEN  Differential  Result Value Ref Range   Neutrophils Relative % 0 %   Lymphocytes Relative 0 %   Monocytes Relative 0 %   Eosinophils Relative 0 %   Basophils Relative 0 %   Neutro Abs 0.0 (L) 1.7 - 7.7 K/uL   Lymphs Abs 0.0 (L) 0.7 - 4.0 K/uL   Monocytes Absolute 0.0 (L) 0.1 - 1.0 K/uL   Eosinophils Absolute 0.0 0.0 - 0.7 K/uL   Basophils Absolute 0.0 0.0 - 0.1 K/uL   Smear Review MORPHOLOGY UNREMARKABLE   I-Stat beta hCG blood, ED ([facility], [facility], [facility] only)  Result Value Ref Range   I-stat hCG, quantitative <5.0 <5 mIU/mL   Comment 3          POC CBG, ED  Result Value Ref Range   Glucose-Capillary 134 (H) 65 - 99 mg/dL  I-Stat CG4 Lactic Acid, ED  Result Value Ref Range   Lactic Acid, Venous 2.43 (HH) 0.5 - 2.0 mmol/L   Comment NOTIFIED PHYSICIAN    Dg Chest 2 View  12/09/2015  CLINICAL DATA:  Shortness of breath, cough, chest congestion and CHF. Initial encounter. EXAM: CHEST  2 VIEW COMPARISON:  Most recent chest imaging radiographs 10/05/2015, CT 07/08/2015 reviewed. FINDINGS: Left-sided pacemaker remains in place. Cardiomegaly, with mild improvement from prior. Persistent but improved central pulmonary edema. No consolidation to suggest pneumonia. No pleural effusion or pneumothorax. Osseous structures are unchanged. IMPRESSION: Mild CHF which appears chronic, with improvement compared to prior exam. Electronically Signed   By: Jeb Levering M.D.   On: 12/09/2015 23:08   Ct Angio Chest Pe W/cm &/or Wo Cm  12/10/2015  CLINICAL  DATA:  Initial evaluation for acute shortness of breath. History of lupus. EXAM: CT ANGIOGRAPHY CHEST WITH CONTRAST TECHNIQUE: Multidetector CT imaging of the chest was performed using the standard protocol during bolus administration of intravenous contrast. Multiplanar CT image reconstructions and MIPs were obtained to evaluate the vascular anatomy. CONTRAST:  49m OMNIPAQUE IOHEXOL 350 MG/ML SOLN COMPARISON:  Prior radiograph from 12/09/2015. FINDINGS: Visualized thyroid gland normal. No pathologically enlarged mediastinal or axillary adenopathy. Mild soft tissue fullness within the bilateral hila similar to previous, which may related to lymphoid tissue. Intrathoracic aorta of normal caliber and appearance. Great vessels within normal limits. Cardiomegaly with noted. Trace pericardial effusion. Left-sided pacemaker in place. Evaluation of the pulmonary arteries somewhat limited by body habitus and motion artifact. Main pulmonary artery measures within normal limits at 2.2 cm in  diameter. No central or segmental filling defect to suggest acute pulmonary embolus identified. Re-formatted imaging confirms these findings. Evaluation for small distal emboli sub 1 limited on this examination. Diffuse severe ground-glass attenuation, relatively bilateral and symmetric, with predominance in the mid and lower lungs. Air bronchograms extend from the perihilar regions to the periphery. More nodular densities within the bilateral upper lungs. Possible trace bilateral pleural effusions. These findings are relatively similar relative to most recent CT from 07/08/2015. Visualized upper abdomen demonstrates no acute abnormality. Possible hepatic steatosis. No acute osseous abnormality. No worrisome lytic or blastic osseous lesions. IMPRESSION: 1. No CT evidence for acute pulmonary embolism. Please note that evaluation for possible small -distal pulmonary emboli somewhat limited due to body habitus and extensive bilateral  consolidation within the lungs. 2. Severe bilaterally symmetric basilar predominant ground-glass consolidation with air bronchograms, overall similar in appearance relative to most recent CT from 07/08/2015. Possible underlying chronic lung disease related to interstitial pneumonitis or possibly history of lupus/autoimmune disorder could be considered. 3. Cardiomegaly without definite evidence for pulmonary edema. Electronically Signed   By: Jeannine Boga M.D.   On: 12/10/2015 02:23   Ct Abdomen Pelvis W Contrast  12/14/2015  CLINICAL DATA:  Initial evaluation for generalized abdominal pain. EXAM: CT ABDOMEN AND PELVIS WITH CONTRAST TECHNIQUE: Multidetector CT imaging of the abdomen and pelvis was performed using the standard protocol following bolus administration of intravenous contrast. CONTRAST:  179m OMNIPAQUE IOHEXOL 300 MG/ML  SOLN COMPARISON:  Prior CT from 02/25/2013. FINDINGS: There are extensive ground-glass opacities with air bronchograms throughout the partially visualized lungs, highly suspicious for possible infection. A component of pulmonary edema may be present as well, although no significant pleural effusion identified. Pacemaker electrodes partially visualized. No pericardial effusion. Liver demonstrates a normal contrast enhanced appearance. Gallbladder absent. No biliary dilatation. Spleen, adrenal glands, and pancreas demonstrate a normal contrast enhanced appearance. Kidneys are equal in size with symmetric enhancement. No nephrolithiasis or hydronephrosis. No focal enhancing renal mass. Stomach within normal limits. No evidence for bowel obstruction. No wall thickening, mucosal enhancement, or inflammatory fat stranding seen about the bowels. Appendix well visualized in the right abdomen and is of normal caliber and appearance without associated inflammatory changes to suggest acute appendicitis. Fluid level present within the lumen of the distal colon/rectum, which may reflect  active diarrheal illness. Bladder partially distended but grossly unremarkable. Uterus within normal limits. Ovaries normal for patient age. No free air or fluid. No pathologically enlarged intra-abdominal pelvic lymph nodes. Normal intravascular enhancement seen throughout the intra-abdominal aorta and its branch vessels. No acute osseous abnormality. No worrisome lytic or blastic osseous lesions. 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 IMPRESSION: 1. Fluid level within the lumen of the distal colon, suggesting possible active diarrheal illness. No CT evidence for other acute intra-abdominal or pelvic process. 2. Extensive ground-glass opacities seen throughout the partially visualized lung bases. Findings may reflect sequela of infection or possibly edema. 3. Status post cholecystectomy. Electronically Signed   By: BJeannine BogaM.D.   On: 12/14/2015 06:21    I have personally reviewed and evaluated these images and lab results as part of my medical decision-making.   EKG Interpretation None      MDM   Final diagnoses:  None    Patient with abdominal pain. Pain is moderate to severe. Has been worsening since yesterday. Patient was seen recently for shortness of breath. She had a PE study, which is negative. She is on home O2 for CHF. Patient is mildly  febrile. Mildly hyportensive. Will give fluids, will check labs, check CT, and will reassess.  Patient still having some pain after pain meds.  CT as above.  Patient with need to be admitted for UTI vs early pyelo with hypotension and fever.  6:38 AM Repeat lactate 0.97 (not crossing in epic).  6:52 AM Discussed with Dr. Blaine Hamper from [facility], who expresses concern about persistent hypertension, blood pressure remains in the mid 80s. Recommends critical care consult.  I spoke with Dr. Jimmy Footman from critical care, who recommends giving an additional liter of fluid. Reviewed patient's BNP, which is 45, not elevated. Repeat lactic acid is 0.97. Patient  is on 3 L of oxygen at baseline. Dr. Jimmy Footman advises giving another liter of fluid. If blood pressure stabilizes, then hospitalist can admit, otherwise critical care will need to admit.  Patient signed out to [place], [place].   Montine Circle, PA-C 12/14/15 5053  Virgel Manifold, MD 12/20/15 [phone]

## 2015-12-14 NOTE — ED Provider Notes (Signed)
  Physical Exam  BP 90/63 mmHg  Pulse 101  Temp(Src) 99.7 F (37.6 C) (Oral)  Resp 13  SpO2 100%  LMP   Physical Exam  Constitutional: She is oriented to person, place, and time. She appears well-developed and well-nourished.  HENT:  Head: Normocephalic and atraumatic.  Eyes: Conjunctivae and EOM are normal. Right eye exhibits no discharge. Left eye exhibits no discharge. No scleral icterus.  Neck: Normal range of motion. Neck supple.  Cardiovascular: Normal rate, regular rhythm, normal heart sounds and intact distal pulses.   Pulmonary/Chest: Effort normal and breath sounds normal.  Abdominal: Soft. Bowel sounds are normal. She exhibits no distension and no mass. There is tenderness (diffuse tenderness). There is no rebound and no guarding.  Musculoskeletal: She exhibits no edema.  Neurological: She is alert and oriented to person, place, and time.  Skin: Skin is warm and dry.  Nursing note and vitals reviewed.   ED Course  Procedures  MDM  Hand-off from Roxy Horseman, PA-C. Admission pending vitals s/p IVF.   Pt is a 35 yo female with PMH of CHF who presents with the ED with worsening abdominal pain, onset last night. Endorses nausea. Denies any other sxs or complaints. Pt notes she is on home O2 (3L) for CHF. On initial exam pt was mildly febrile and hypotensive. Pt was given IVF. Initial lactic 2.43, UA consistent with UTI, WBC 11.1. Repeat lactic 0.97. Plan to admit pt for UTI vs early pyelonephritis.   Ivar Drape spoke with both the hospitalist, Dr. Clyde Lundborg and critical care, Dr. Darrick Penna regarding admission. Critical care advised to give pt an additional liter of fluid and then recheck BP. If BP <90, plan to admit to critical care, otherwise if BP stabilizes plan to admit to hospitalist.  Pt given total of 2L IVF while in the ED. On reevaluation pt's BP 82/60. Paged critical care for admission. Dr. Kendrick Fries agrees to admission.    Satira Sark [place], [place] 12/14/15  1602  Raeford Razor, MD 12/20/15 [phone]

## 2015-12-14 NOTE — ED Notes (Signed)
MP shows intermittent paced rhythm, NS fluid bolus infusing-O2 sat 99% on O2 5l/[place].  Respirations remain shallow with moaning abdominal 10/10 abdominal pain.

## 2015-12-15 DIAGNOSIS — I959 Hypotension, unspecified: Secondary | ICD-10-CM

## 2015-12-15 DIAGNOSIS — N390 Urinary tract infection, site not specified: Secondary | ICD-10-CM | POA: Insufficient documentation

## 2015-12-15 LAB — GLUCOSE, CAPILLARY
GLUCOSE-CAPILLARY: 196 mg/dL — AB (ref 65–99)
Glucose-Capillary: 132 mg/dL — ABNORMAL HIGH (ref 65–99)
Glucose-Capillary: 173 mg/dL — ABNORMAL HIGH (ref 65–99)
Glucose-Capillary: 187 mg/dL — ABNORMAL HIGH (ref 65–99)

## 2015-12-15 LAB — CBC WITH DIFFERENTIAL/PLATELET
BASOS ABS: 0.1 10*3/uL (ref 0.0–0.1)
Basophils Relative: 1 %
EOS ABS: 0 10*3/uL (ref 0.0–0.7)
Eosinophils Relative: 0 %
HCT: 29.3 % — ABNORMAL LOW (ref 36.0–46.0)
HEMOGLOBIN: 9 g/dL — AB (ref 12.0–15.0)
LYMPHS ABS: 1.3 10*3/uL (ref 0.7–4.0)
LYMPHS PCT: 20 %
MCH: 24.7 pg — ABNORMAL LOW (ref 26.0–34.0)
MCHC: 30.7 g/dL (ref 30.0–36.0)
MCV: 80.5 fL (ref 78.0–100.0)
Monocytes Absolute: 0.3 10*3/uL (ref 0.1–1.0)
Monocytes Relative: 5 %
NEUTROS ABS: 4.6 10*3/uL (ref 1.7–7.7)
Neutrophils Relative %: 74 %
Platelets: 322 10*3/uL (ref 150–400)
RBC: 3.64 MIL/uL — AB (ref 3.87–5.11)
RDW: 18 % — AB (ref 11.5–15.5)
WBC: 6.3 10*3/uL (ref 4.0–10.5)

## 2015-12-15 LAB — COMPREHENSIVE METABOLIC PANEL
ALBUMIN: 1.6 g/dL — AB (ref 3.5–5.0)
ALK PHOS: 55 U/L (ref 38–126)
ALT: 22 U/L (ref 14–54)
ANION GAP: 12 (ref 5–15)
AST: 42 U/L — ABNORMAL HIGH (ref 15–41)
BILIRUBIN TOTAL: 0.5 mg/dL (ref 0.3–1.2)
BUN: 8 mg/dL (ref 6–20)
CALCIUM: 7.7 mg/dL — AB (ref 8.9–10.3)
CO2: 19 mmol/L — ABNORMAL LOW (ref 22–32)
Chloride: 109 mmol/L (ref 101–111)
Creatinine, Ser: 0.59 mg/dL (ref 0.44–1.00)
GFR calc Af Amer: >60 mL/min (ref >60)
GLUCOSE: 218 mg/dL — AB (ref 65–99)
Potassium: 4 mmol/L (ref 3.5–5.1)
Sodium: 140 mmol/L (ref 135–145)
TOTAL PROTEIN: 5.9 g/dL — AB (ref 6.5–8.1)

## 2015-12-15 NOTE — Progress Notes (Signed)
Family Medicine Teaching Service Daily Progress Note Intern Pager: [phone]  Patient name: Makayla Vasquez Medical record number: [id number] Date of birth: 1980/11/22 Age: 35 y.o. Gender: female  Primary Care Provider: Saralyn Pilar, DO Consultants: None  Code Status: Full   Pt Overview and Major Events to Date:   Assessment and Plan: Makayla Vasquez is a 35 y.o. female presenting with nausea, vomiting and diarrhea. PMH is significant for polycystic ovarian disease, Lupus, interstitial lung disease, hyperlipidemia, heart block,congestive dilated cardiomyopathy,  Bipolar affective disorder, adrenal insufficiency, type 2 diabetes  Pyelonephritis. Denies nausea or vomited today. Indicates decreased urgency  Admitted with sepsis (elevated WBC 14.6 and RR 20-40s likely due to pyelonephritis. Lactic acid on admission slightly elevated to 2.43 > 0.97.  UA positive to many bacteria and moderate LE. Urine culture positive for Escherichia coli greater than 100,000 CFU.  - WBC trending down 14.6> 11  - Will continue ceftriaxone - Follow up for sensitives of urine culture  - Will provide careful fluid hydration of NS  - AM CBC and BMET  - Will get G/C and Trich to determine possibly presence of PID   Chronic steroids with history of adrenal suppression. Home Prednisone 10 mg daily  - chronically hypotensive, possibly due in some part to adrenal suppression - Continue hydrocortisone 50 mg IV  T2DM: A1C 8.5 05/04/2015. Home metformin and 35 units of Lantus. 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 She states on  Thursday she had an episode of continuous vomiting episode after eating, then called EMS and was brought to [facility]. She states that she has had foul-smelling urine for the past 2 days before admission. However denies any dysuria. She was seen earlier in the week for shortness of breath in the ED, states at the time she was also having nausea and abdominal pain. The SOB has resolved per patient. Endorse chills. Decreased urgency, states she only urinates about once or twice a day. Denies any increased frequency.   Review of Systems  Constitutional: Positive for chills. Negative for fever.  HENT: Positive for sore throat. Negative for congestion.   Eyes: Negative for blurred vision.  Respiratory: Negative for shortness of breath and wheezing.   Cardiovascular: Negative for chest pain and palpitations.  Gastrointestinal: Positive for nausea, vomiting and abdominal pain. Negative for diarrhea.  Genitourinary: Positive for urgency.  Neurological: Negative for sensory change and focal weakness.   Objective: Temp:  [97.5 F (36.4 C)-98.1 F (36.7 C)] 97.6 F (36.4 C) (02/25 1458) Pulse Rate:  [60-117] 68 (02/25 1458) Resp:  [0-37] 15 (02/25 1458) BP: (81-111)/(46-71) 84/50 mmHg (02/25 1458) SpO2:  [94 %-100 %] 99 % (02/25 1458) Weight:  [183 lb (83.008 kg)] 183 lb (83.008 kg) (02/25 0200) Physical Exam: General: Lying in bed, NAD  Eyes : PERRL  HEENT: Moist mucosa membranes, no thyromegaly  Cardiovascular: Paced Rhythm,  Respiratory: CTAB, no wheezes or rhonchi, 4L [place]  Abdomen:  BS+, suprapubic tenderness on palpation, no rebound or guarding, no CVA tenderness  Extremities: No lower extremity edema, limbs warm, equal strength in upper and lower extremities   Laboratory:  Recent Labs Lab 12/09/15 2205 12/14/15 0119  WBC 14.6* 11.1*  HGB 10.5* 10.3*  HCT 35.1* 34.3*  PLT 469* 396     Recent Labs Lab 12/09/15 2205 12/14/15 0119  NA 128* 134*  K 4.5 4.2  CL 95* 101  CO2 18* 23  BUN 16 11  CREATININE 0.98 0.56  CALCIUM 8.2* 7.8*  PROT  --  6.5  BILITOT  --  1.7*  ALKPHOS  --  67  ALT  --  25  AST  --  59*  GLUCOSE 163* 169*    Imaging/Diagnostic Tests:  Ct Abdomen Pelvis W Contrast  12/14/2015  CLINICAL DATA:  Initial evaluation for generalized abdominal pain. EXAM: CT ABDOMEN AND PELVIS WITH CONTRAST TECHNIQUE: Multidetector CT imaging of the abdomen and pelvis was performed using the standard protocol following bolus administration of intravenous contrast. CONTRAST:  OMNIPAQUE IOHEXOL 300 MG/ML  SOLN COMPARISON:  Prior CT from 02/25/2013. FINDINGS: There are extensive ground-glass opacities with air bronchograms throughout the partially visualized lungs, highly suspicious for possible infection. A component of pulmonary edema may be present as well, although no significant pleural effusion identified. Pacemaker electrodes partially visualized. No pericardial effusion. Liver demonstrates a normal contrast enhanced appearance. Gallbladder absent. No biliary dilatation. Spleen, adrenal glands, and pancreas demonstrate a normal contrast enhanced appearance. Kidneys are equal in size with symmetric enhancement. No nephrolithiasis or hydronephrosis. No focal enhancing renal mass. Stomach within normal limits. No evidence for bowel obstruction. No wall thickening, mucosal enhancement, or inflammatory fat stranding seen about the bowels. Appendix well visualized in the right abdomen and is of normal caliber and appearance without associated inflammatory changes to suggest acute appendicitis. Fluid level present within the lumen of the distal colon/rectum, which may reflect active diarrheal illness. Bladder partially distended but grossly unremarkable. Uterus within normal limits. Ovaries normal for patient age. No free air or fluid. No pathologically enlarged  intra-abdominal pelvic lymph nodes. Normal intravascular enhancement seen throughout the intra-abdominal aorta and its branch vessels. No acute osseous abnormality. No worrisome lytic or blastic osseous lesions. 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 IMPRESSION: 1. Fluid level within the lumen of the distal colon, suggesting possible active diarrheal illness. No CT evidence for other acute intra-abdominal or pelvic process. 2. Extensive ground-glass opacities seen throughout the partially visualized lung bases. Findings may reflect sequela of infection or possibly edema. 3. Status post cholecystectomy. Electronically Signed   By: Rise Mu M.D.   On: 12/14/2015 06:21    Naleyah Ohlinger Mayra Reel, MD 12/15/2015, 3:28 PM PGY-1, [facility] Family Medicine FPTS Intern pager: [phone], text pages welcome

## 2015-12-15 NOTE — Discharge Summary (Signed)
[facility] Discharge Summary  Patient name: Makayla Vasquez Medical record number: [id number] Date of birth: 11/28/1980 Age: 35 y.o. Gender: female Date of Admission: 12/14/2015  Date of Discharge: 12/17/2015 Admitting Physician: Juanito Doom, MD  Primary Care Provider: Nobie Putnam, DO Consultants:   Indication for Hospitalization: Pyelonephritis   Discharge Diagnoses/Problem List:  polycystic ovarian disease, Lupus interstitial lung disease Hyperlipidemia heart block congestive dilated cardiomyopathy Bipolar affective disorder adrenal insufficiency type 2 diabetes  Disposition: Home   Discharge Condition: Stable    Discharge Exam:  General: 35yo female sitting in bed in no apparent distress Cardiovascular: Paced Rhythm, no murmurs, regular rate Respiratory: CTAB, no wheezes or rhonchi, 4L [place]  Abdomen: BS+, suprapubic tenderness on palpation, no rebound or guarding, no CVA tenderness  Extremities: No lower extremity edema  Brief Hospital Course:  Patient was admitted with a 2 day history of nausea, vomiting, and abdominal pain prior to admission. Patient was found to have E.coli in urine culture and was thought to have a pyelonephritis. Patient was treated with 3 days of Ceftriaxone and following sensitives was transitioned to PO keflex. During stay patient denied any further symptoms of nausea or vomiting. She denied dysuria, however indicated that she decreased urgency. She was however able to void without any concern. Patient pelvic exam was negative, and her vaginal swab was negative for G/C or trich. Patient was noted to hypotensive on admission, initial considered due to sepsis, however with chart review, as thought that patient is chronically hypotensive.   Issues for Follow Up:  1. Patient with a hx of irregular vaginal bleeding, will need an endometrial biopsy and Renal u/s outpatient 2. Patient to receive Keflex for full  course treatment of pyelonephritis until March 7th, 2017 (stop date)  3. Provide a prednisone stress taper (30 mg for 5 days, then 20 mg for 5 days, then back to normal prednisone dose) 4. Patient's blood pressure has been chronically low, consider endocrinology follow up.   Significant Procedures: None   Significant Labs and Imaging:   Recent Labs Lab 12/16/15 0616 12/17/15 0916 12/18/15 1445  WBC 5.4 9.8 12.9*  HGB 8.8* 9.6* 9.7*  HCT 29.9* 33.5* 32.1*  PLT 331 397 352    Recent Labs Lab 12/14/15 0119 12/15/15 1636 12/17/15 0916 12/18/15 1445  NA 134* 140 143 141  K 4.2 4.0 3.7 3.4*  CL 101 109 110 106  CO2 23 19* 24 27  GLUCOSE 169* 218* 112* 97  BUN _0 CREATININE 0.56 0.59 0.60 0.57  CALCIUM 7.8* 7.7* 8.6* 7.6*  ALKPHOS 67 55  --   --   AST 59* 42*  --   --   ALT 25 22  --   --   ALBUMIN 2.2* 1.6*  --   --     Dg Chest 2 View  12/18/2015  CLINICAL DATA:  Cough, lupus, history of asthma EXAM: CHEST  2 VIEW COMPARISON:  CT chest 12/10/2015 FINDINGS: There are bilateral interstitial and alveolar airspace opacities. There are bilateral small pleural effusions. There is no pneumothorax. The heart and mediastinal contours are unremarkable. There is a cardiac pacemaker. The osseous structures are unremarkable. IMPRESSION: Bilateral interstitial and alveolar airspace opacities which may reflect pulmonary edema versus multi lobar pneumonia. Electronically Signed   By: Kathreen Devoid   On: 12/18/2015 14:07   Results/Tests Pending at Time of Discharge: None   Discharge Medications:    Medication List    STOP  taking these medications        furosemide 20 MG tablet  Commonly known as:  LASIX      TAKE these medications        albuterol 108 (90 Base) MCG/ACT inhaler  Commonly known as:  PROVENTIL HFA;VENTOLIN HFA  Inhale 2 puffs into the lungs every 4 (four) hours as needed for wheezing or shortness of breath.     atorvastatin 40 MG tablet  Commonly known as:   LIPITOR  Take 1 tablet (40 mg total) by mouth daily.     bisoprolol 5 MG tablet  Commonly known as:  ZEBETA  Take 2.5 mg by mouth daily.     budesonide-formoterol 80-4.5 MCG/ACT inhaler  Commonly known as:  SYMBICORT  Inhale 2 puffs into the lungs 2 (two) times daily as needed (for shortness of breath).     calcium-vitamin D 500-200 MG-UNIT tablet  Commonly known as:  OSCAL WITH D  Take 1 tablet by mouth daily with breakfast.     cephALEXin 500 MG capsule  Commonly known as:  KEFLEX  Take 1 capsule (500 mg total) by mouth every 6 (six) hours.     fluocinonide 0.05 % external solution  Commonly known as:  LIDEX  1 APPLICATION APPLY ON THE SKIN AS DIRECTED APPLY TO AFFECTED AREAS DAILY ON SCALP AS NEEDED FOR ITCHING.     GLUCERNA ADVANCE SHAKE Liqd  Take supplement daily in place of 1 meal.     glucose blood test strip  Commonly known as:  ONE TOUCH ULTRA TEST  Check blood sugar daily before breakfast     Insulin Glargine 100 UNIT/ML Solostar Pen  Commonly known as:  LANTUS SOLOSTAR  Inject 35 Units into the skin daily at 10 pm.     Insulin Pen Needle 32G X 4 MM Misc  Commonly known as:  INSUPEN PEN NEEDLES  Use with insulin pen     metFORMIN 1000 MG tablet  Commonly known as:  GLUCOPHAGE  TAKE 1 TABLET BY MOUTH TWICE A DAY WITH A MEAL     mycophenolate 500 MG tablet  Commonly known as:  CELLCEPT  Take 500 mg by mouth 2 (two) times daily.     NEXPLANON 68 MG Impl implant  Generic drug:  etonogestrel  1 each by Subdermal route once.     ONE TOUCH ULTRA 2 w/Device Kit  Check blood sugar daily before breakfast     oxyCODONE-acetaminophen 5-325 MG tablet  Commonly known as:  PERCOCET  Take 1 tablet by mouth every 6 (six) hours as needed for severe pain.     potassium chloride SA 20 MEQ tablet  Commonly known as:  KLOR-CON M20  Take 1 tablet (20 mEq total) by mouth daily.     predniSONE 10 MG tablet  Commonly known as:  DELTASONE  Take 3 tablets for 5 days,  then 2 tablets for 5 days, then return 1 tablet daily        Discharge Instructions: Please refer to Patient Instructions section of EMR for full details.  Patient was counseled important signs and symptoms that should prompt return to medical care, changes in medications, dietary instructions, activity restrictions, and follow up appointments.   Follow-Up Appointments: Follow-up Information    Follow up with Nobie Putnam, DO. Go on 12/24/2015.   Specialty:  Osteopathic Medicine   Why:  Hospital follow-up @ 10:15 AM   Contact information:   [place] [id number] [phone]  Lanisa Ishler Cletis Media, MD 12/18/2015, 11:35 PM PGY-1, [place]

## 2015-12-16 DIAGNOSIS — I442 Atrioventricular block, complete: Secondary | ICD-10-CM | POA: Insufficient documentation

## 2015-12-16 DIAGNOSIS — R002 Palpitations: Secondary | ICD-10-CM | POA: Insufficient documentation

## 2015-12-16 DIAGNOSIS — Z950 Presence of cardiac pacemaker: Secondary | ICD-10-CM

## 2015-12-16 LAB — GLUCOSE, CAPILLARY
GLUCOSE-CAPILLARY: 131 mg/dL — AB (ref 65–99)
GLUCOSE-CAPILLARY: 176 mg/dL — AB (ref 65–99)
GLUCOSE-CAPILLARY: 170 mg/dL — AB (ref 65–99)
GLUCOSE-CAPILLARY: 210 mg/dL — AB (ref 65–99)

## 2015-12-16 LAB — URINE CULTURE: Culture: >=100000

## 2015-12-16 LAB — CBC
HCT: 29.9 % — ABNORMAL LOW (ref 36.0–46.0)
HEMOGLOBIN: 8.8 g/dL — AB (ref 12.0–15.0)
MCH: 24 pg — ABNORMAL LOW (ref 26.0–34.0)
MCHC: 29.4 g/dL — ABNORMAL LOW (ref 30.0–36.0)
MCV: 81.5 fL (ref 78.0–100.0)
PLATELETS: 331 10*3/uL (ref 150–400)
RBC: 3.67 MIL/uL — AB (ref 3.87–5.11)
RDW: 18 % — ABNORMAL HIGH (ref 11.5–15.5)
WBC: 5.4 10*3/uL (ref 4.0–10.5)

## 2015-12-16 LAB — TROPONIN I: Troponin I: <0.03 ng/mL (ref <0.031)

## 2015-12-16 MED ORDER — BENZONATATE 100 MG PO CAPS
200.0000 mg | ORAL_CAPSULE | Freq: Three times a day (TID) | ORAL | Status: DC | PRN
  Administered 2015-12-16: 200 mg via ORAL
  Filled 2015-12-16: qty 2

## 2015-12-16 MED ORDER — PANTOPRAZOLE SODIUM 40 MG PO TBEC
40.0000 mg | DELAYED_RELEASE_TABLET | Freq: Every day | ORAL | Status: DC
  Administered 2015-12-16 – 2015-12-17 (×2): 40 mg via ORAL
  Filled 2015-12-16 (×2): qty 1

## 2015-12-16 NOTE — Progress Notes (Signed)
Interim Progress Note: Received page from nursing reporting increased chest pressure and palpitations. Went to evaluate and Mrs. Digilio states she feels really odd and like she is having palpitations. States it has been a long time since she's felt this way. Notes shortness of breath at baseline, but none worse than her normal at this time. Denies diaphoresis.   PE: General: 35yo female resting comfortably in no apparent distress Cardiac: S1 and S2 noted, low normal rate (~60) Resp: Clear to auscultation bilaterally, no wheezes Extremities: No edema  A/P: # Palpitations: - AICD in place. Cardiology contacted for interrogation of AICD. - EKG with prolonged QT, rate of 60. - Stat Troponin - Will continue to monitor.  Dr. Caroleen Hamman 12/16/15, 4:54 PM

## 2015-12-16 NOTE — Progress Notes (Signed)
Patient called c/o chest discomfort and feeling like "something stuck" in her throat. BP  99/59 HR 60 AV Paced.MD made aware.Recieved order for EKG.MD will come to see patient.

## 2015-12-16 NOTE — Progress Notes (Signed)
Family Medicine Teaching Service Daily Progress Note Intern Pager: [phone]  Patient name: Makayla Vasquez Medical record number: [id number] Date of birth: 06-Feb-1981 Age: 35 y.o. Gender: female  Primary Care Provider: Saralyn Pilar, DO Consultants: None  Code Status: Full   Assessment and Plan: 35 y.o. female presenting with nausea, vomiting and diarrhea. PMH is significant for polycystic ovarian disease, Lupus, interstitial lung disease, hyperlipidemia, heart block,congestive dilated cardiomyopathy,  Bipolar affective disorder, adrenal insufficiency, type 2 diabetes  # UTI. Admitted with sepsis (elevated WBC 14.6 and RR 20-40s, Lactic Acid 2.43>0.97). UA positive to many bacteria and moderate LE. Urine culture positive for Escherichia coli greater than 100,000 CFU. On 2-3L at baseline. - WBC trending down 14.6>5.4 - AM CBC and BMET - Wean O2 to baseline. Consider CXR if no improvement. - Follow up G/C and Trich  - Follow up Urine Culture sensitivities - Will continue ceftriaxone (Day # 3). Transition to oral antibiotics once sensitivities have returned. - Recommend scheduled urination every few hours to retrain bladder to feel urge to urinate.  # Adrenal Suppression d/t Chronic Steroids. Home Prednisone 10 mg daily. Chronically hypotensive, possibly due in some part to adrenal suppression - Continue hydrocortisone 50 mg IV  # T2DM: A1C 8.5 05/04/2015. Home metformin and 35 units of Lantus. AM CBG 132 - Continue home Lantus and moderate SSI/QH  - ACQHS CBGs  # HFrEF- Echo with EF 40-45% (10/2015) - Monitor fluid status carefully  - Holding home lasix and KDUR and bisprolol  - Daily weights, Strict I&O  FEN/GI: PPI, advance diet as tolerated, NS@ 75 ml  PPx: Lovenox   Disposition: Discharge once on oral antibiotics and baseline O2 requirement  Progress: Feeling better today. Continues to note mild suprapubic pain. Denies dysuria, increased frequency, fever. States she  never feels the urge to urinate and makes herself urinate twice a day. No further acute concerns.  ROS Physical Exam: General: 34yo female sitting in bed in no apparent distress Cardiovascular: Paced Rhythm, no murmurs, regular rate Respiratory: CTAB, no wheezes or rhonchi, 4L [place]  Abdomen: BS+, suprapubic tenderness on palpation, no rebound or guarding, no CVA tenderness  Extremities: No lower extremity edema, limbs warm, equal strength in upper and lower extremities   Laboratory:  Recent Labs Lab 12/14/15 0119 12/15/15 1636 12/16/15 0616  WBC 11.1* 6.3 5.4  HGB 10.3* 9.0* 8.8*  HCT 34.3* 29.3* 29.9*  PLT 396 322 331    Recent Labs Lab 12/09/15 2205 12/14/15 0119 12/15/15 1636  NA 128* 134* 140  K 4.5 4.2 4.0  CL 95* 101 109  CO2 18* 23 19*  BUN 16 11 8   CREATININE 0.98 0.56 0.59  CALCIUM 8.2* 7.8* 7.7*  PROT  --  6.5 5.9*  BILITOT  --  1.7* 0.5  ALKPHOS  --  67 55  ALT  --  25 22  AST  --  59* 42*  GLUCOSE 163* 169* 218*  - Lactic Acid 0.97 - BNP [address] [place], DO 12/16/2015, 9:01 AM PGY-2, [place] [facility] FPTS Intern pager: [phone], text pages welcome

## 2015-12-17 DIAGNOSIS — N10 Acute tubulo-interstitial nephritis: Secondary | ICD-10-CM

## 2015-12-17 LAB — CERVICOVAGINAL ANCILLARY ONLY
Chlamydia: NEGATIVE
NEISSERIA GONORRHEA: NEGATIVE
TRICH (WINDOWPATH): NEGATIVE

## 2015-12-17 LAB — GLUCOSE, CAPILLARY
GLUCOSE-CAPILLARY: 115 mg/dL — AB (ref 65–99)
GLUCOSE-CAPILLARY: 205 mg/dL — AB (ref 65–99)
Glucose-Capillary: 151 mg/dL — ABNORMAL HIGH (ref 65–99)

## 2015-12-17 LAB — CBC
HEMATOCRIT: 33.5 % — AB (ref 36.0–46.0)
HEMOGLOBIN: 9.6 g/dL — AB (ref 12.0–15.0)
MCH: 23.7 pg — ABNORMAL LOW (ref 26.0–34.0)
MCHC: 28.7 g/dL — ABNORMAL LOW (ref 30.0–36.0)
MCV: 82.7 fL (ref 78.0–100.0)
PLATELETS: 397 10*3/uL (ref 150–400)
RBC: 4.05 MIL/uL (ref 3.87–5.11)
RDW: 18.2 % — AB (ref 11.5–15.5)
WBC: 9.8 10*3/uL (ref 4.0–10.5)

## 2015-12-17 LAB — BASIC METABOLIC PANEL
ANION GAP: 9 (ref 5–15)
BUN: 8 mg/dL (ref 6–20)
CALCIUM: 8.6 mg/dL — AB (ref 8.9–10.3)
CO2: 24 mmol/L (ref 22–32)
Chloride: 110 mmol/L (ref 101–111)
Creatinine, Ser: 0.6 mg/dL (ref 0.44–1.00)
GFR calc Af Amer: >60 mL/min (ref >60)
GLUCOSE: 112 mg/dL — AB (ref 65–99)
POTASSIUM: 3.7 mmol/L (ref 3.5–5.1)
SODIUM: 143 mmol/L (ref 135–145)

## 2015-12-17 MED ORDER — CEPHALEXIN 500 MG PO CAPS
500.0000 mg | ORAL_CAPSULE | Freq: Four times a day (QID) | ORAL | Status: DC
  Administered 2015-12-17 (×2): 500 mg via ORAL
  Filled 2015-12-17 (×2): qty 1

## 2015-12-17 MED ORDER — PREDNISONE 10 MG PO TABS
ORAL_TABLET | ORAL | Status: DC
Start: 2015-12-17 — End: 2015-12-20

## 2015-12-17 MED ORDER — PREDNISONE 20 MG PO TABS: 30.0000 mg | ORAL_TABLET | Freq: Every day | ORAL | Status: DC

## 2015-12-17 MED ORDER — CEPHALEXIN 500 MG PO CAPS: 500.0000 mg | ORAL_CAPSULE | Freq: Four times a day (QID) | ORAL | Status: DC

## 2015-12-17 NOTE — Care Management Important Message (Signed)
Important Message  Patient Details  Name: [name] MRN: [id number] Date of Birth: 12/13/1980   Medicare Important Message Given:  Yes    Oralia Rud Larri [place]ton 12/17/2015, 4:58 PM

## 2015-12-17 NOTE — Progress Notes (Signed)
Pt was able to make alternate transport arrangements and was able to be discharged this evening in stable condition. Discharge instructions provided with no concerns voiced

## 2015-12-17 NOTE — Progress Notes (Signed)
Pt getting discharged this evening, says that she is making arrangements for transport home

## 2015-12-17 NOTE — Discharge Instructions (Signed)
Were admitted for a infection of your kidney. You will need to continue taking antibiotics until December 25, 2015. You will also need to increase your dose of prednisone for the next couple of weeks. Please take Prednisone 30 mg for 5 days, then 20 mg for 5 days, and then you can resume your normal dose of prednisone at 10 mg daily dose.

## 2015-12-17 NOTE — Progress Notes (Signed)
Pt says she is still making transportation arrangements

## 2015-12-17 NOTE — Progress Notes (Signed)
Family Medicine Teaching Service Daily Progress Note Intern Pager: [phone]  Patient name: Makayla Vasquez Medical record number: [id number] Date of birth: 1981-06-15 Age: 35 y.o. Gender: female  Primary Care Provider: Saralyn Pilar, DO Consultants: None  Code Status: Full   Assessment and Plan: 35 y.o. female presenting with nausea, vomiting and diarrhea. PMH is significant for polycystic ovarian disease, Lupus, interstitial lung disease, hyperlipidemia, heart block,congestive dilated cardiomyopathy,  Bipolar affective disorder, adrenal insufficiency, type 2 diabetes  # UTI. Admitted with sepsis (elevated WBC 14.6 and RR 20-40s, Lactic Acid 2.43>0.97). UA positive to many bacteria and moderate LE. Urine culture positive for Escherichia coli greater than 100,000 CFU. Baseline O2 4L  - WBC trending down 14.6>5.4  - Follow up G/C and Trich  - Follow up Urine Culture sensitivities to keflex  - Ceftriaxone (Day # 3) > transition to Keflex today  - Patient continues to have decreased urine urgency   # Adrenal Suppression d/t Chronic Steroids. Home Prednisone 10 mg daily. Chronically hypotensive, possibly due in some part to adrenal suppression.BP   - Continue hydrocortisone 50 mg IV  # T2DM: A1C 8.5 05/04/2015. Home metformin and 35 units of Lantus. CBGs 100- 200  - Continue home Lantus and moderate SSI/QH  - ACQHS CBGs  # HFrEF- No crackles or  Echo with EF 40-45% (10/2015) - Monitor fluid status carefully  - Holding home lasix and KDUR and bisprolol  - Daily weights, Strict I&O  #Congenital AVN block s/p atrial pacer, placed when patient was 15 - Continue telemetry  - Will integrate pacemaker today, called cardiology   FEN/GI: PPI, advance diet as tolerated PPx: Lovenox   Disposition: Discharge once on oral antibiotics and baseline O2 requirement  Progress: Patient doing well today. No SOB, no chest pain, no nausea/vomitting, no fever or chills    ROS  Filed Vitals:   12/16/15 1332 12/16/15 1555  BP: 109/67 99/59  Pulse: 61 60  Temp: 97.7 F (36.5 C)   Resp: 16   Physical Exam: General: 34yo female sitting in bed in no apparent distress Cardiovascular: Paced Rhythm, no murmurs, regular rate Respiratory: CTAB, no wheezes or rhonchi, 4L [place]  Abdomen: BS+, suprapubic tenderness on palpation, no rebound or guarding, no CVA tenderness  Extremities: No lower extremity edema, limbs warm, equal strength in upper and lower extremities   Laboratory:  Recent Labs Lab 12/14/15 0119 12/15/15 1636 12/16/15 0616  WBC 11.1* 6.3 5.4  HGB 10.3* 9.0* 8.8*  HCT 34.3* 29.3* 29.9*  PLT 396 322 331    Recent Labs Lab 12/14/15 0119 12/15/15 1636  NA 134* 140  K 4.2 4.0  CL 101 109  CO2 23 19*  BUN 11 8  CREATININE 0.56 0.59  CALCIUM 7.8* 7.7*  PROT 6.5 5.9*  BILITOT 1.7* 0.5  ALKPHOS 67 55  ALT 25 22  AST 59* 42*  GLUCOSE 169* 218*  - Lactic Acid 0.97 - BNP 45.7  Brenon Antosh Mayra Reel, MD 12/17/2015, 9:50 AM PGY-2, [place] [facility] FPTS Intern pager: [phone], text pages welcome

## 2015-12-17 NOTE — Progress Notes (Signed)
Pt now voicing concerns that she is not sure if her ride will even be able to pick her up today

## 2015-12-18 ENCOUNTER — Inpatient Hospital Stay (HOSPITAL_COMMUNITY): Payer: Medicare Other

## 2015-12-18 ENCOUNTER — Emergency Department (HOSPITAL_COMMUNITY): Payer: Medicare Other

## 2015-12-18 ENCOUNTER — Inpatient Hospital Stay (HOSPITAL_COMMUNITY)
Admission: EM | Admit: 2015-12-18 | Discharge: 2015-12-20 | DRG: 193 | Disposition: A | Discharge status: Rehabilitation Facility | Payer: Medicare Other | Attending: Family Medicine | Admitting: Family Medicine

## 2015-12-18 DIAGNOSIS — I959 Hypotension, unspecified: Secondary | ICD-10-CM | POA: Diagnosis present

## 2015-12-18 DIAGNOSIS — J849 Interstitial pulmonary disease, unspecified: Secondary | ICD-10-CM | POA: Diagnosis present

## 2015-12-18 DIAGNOSIS — T380X5A Adverse effect of glucocorticoids and synthetic analogues, initial encounter: Secondary | ICD-10-CM | POA: Diagnosis present

## 2015-12-18 DIAGNOSIS — R404 Transient alteration of awareness: Secondary | ICD-10-CM | POA: Diagnosis not present

## 2015-12-18 DIAGNOSIS — M329 Systemic lupus erythematosus, unspecified: Secondary | ICD-10-CM | POA: Diagnosis present

## 2015-12-18 DIAGNOSIS — E119 Type 2 diabetes mellitus without complications: Secondary | ICD-10-CM | POA: Diagnosis present

## 2015-12-18 DIAGNOSIS — I10 Essential (primary) hypertension: Secondary | ICD-10-CM | POA: Diagnosis present

## 2015-12-18 DIAGNOSIS — R29898 Other symptoms and signs involving the musculoskeletal system: Secondary | ICD-10-CM | POA: Diagnosis not present

## 2015-12-18 DIAGNOSIS — I509 Heart failure, unspecified: Secondary | ICD-10-CM | POA: Diagnosis not present

## 2015-12-18 DIAGNOSIS — Z794 Long term (current) use of insulin: Secondary | ICD-10-CM

## 2015-12-18 DIAGNOSIS — I5023 Acute on chronic systolic (congestive) heart failure: Secondary | ICD-10-CM | POA: Diagnosis not present

## 2015-12-18 DIAGNOSIS — Z950 Presence of cardiac pacemaker: Secondary | ICD-10-CM

## 2015-12-18 DIAGNOSIS — F3170 Bipolar disorder, currently in remission, most recent episode unspecified: Secondary | ICD-10-CM | POA: Diagnosis not present

## 2015-12-18 DIAGNOSIS — Z87891 Personal history of nicotine dependence: Secondary | ICD-10-CM

## 2015-12-18 DIAGNOSIS — G722 Myopathy due to other toxic agents: Secondary | ICD-10-CM | POA: Diagnosis not present

## 2015-12-18 DIAGNOSIS — J9600 Acute respiratory failure, unspecified whether with hypoxia or hypercapnia: Secondary | ICD-10-CM | POA: Diagnosis not present

## 2015-12-18 DIAGNOSIS — R918 Other nonspecific abnormal finding of lung field: Secondary | ICD-10-CM | POA: Diagnosis not present

## 2015-12-18 DIAGNOSIS — F319 Bipolar disorder, unspecified: Secondary | ICD-10-CM | POA: Diagnosis present

## 2015-12-18 DIAGNOSIS — Z9981 Dependence on supplemental oxygen: Secondary | ICD-10-CM | POA: Insufficient documentation

## 2015-12-18 DIAGNOSIS — R0602 Shortness of breath: Secondary | ICD-10-CM

## 2015-12-18 DIAGNOSIS — R05 Cough: Secondary | ICD-10-CM | POA: Diagnosis not present

## 2015-12-18 DIAGNOSIS — R531 Weakness: Secondary | ICD-10-CM | POA: Diagnosis not present

## 2015-12-18 DIAGNOSIS — R0682 Tachypnea, not elsewhere classified: Secondary | ICD-10-CM | POA: Insufficient documentation

## 2015-12-18 DIAGNOSIS — J189 Pneumonia, unspecified organism: Principal | ICD-10-CM | POA: Diagnosis present

## 2015-12-18 DIAGNOSIS — D638 Anemia in other chronic diseases classified elsewhere: Secondary | ICD-10-CM | POA: Diagnosis not present

## 2015-12-18 DIAGNOSIS — R5381 Other malaise: Secondary | ICD-10-CM | POA: Diagnosis not present

## 2015-12-18 DIAGNOSIS — N12 Tubulo-interstitial nephritis, not specified as acute or chronic: Secondary | ICD-10-CM | POA: Diagnosis present

## 2015-12-18 DIAGNOSIS — N390 Urinary tract infection, site not specified: Secondary | ICD-10-CM | POA: Diagnosis not present

## 2015-12-18 DIAGNOSIS — K5901 Slow transit constipation: Secondary | ICD-10-CM | POA: Diagnosis not present

## 2015-12-18 DIAGNOSIS — K219 Gastro-esophageal reflux disease without esophagitis: Secondary | ICD-10-CM | POA: Diagnosis present

## 2015-12-18 DIAGNOSIS — Q246 Congenital heart block: Secondary | ICD-10-CM | POA: Diagnosis not present

## 2015-12-18 DIAGNOSIS — G6281 Critical illness polyneuropathy: Secondary | ICD-10-CM | POA: Insufficient documentation

## 2015-12-18 DIAGNOSIS — J9621 Acute and chronic respiratory failure with hypoxia: Secondary | ICD-10-CM | POA: Diagnosis present

## 2015-12-18 DIAGNOSIS — R739 Hyperglycemia, unspecified: Secondary | ICD-10-CM | POA: Diagnosis not present

## 2015-12-18 DIAGNOSIS — G720 Drug-induced myopathy: Secondary | ICD-10-CM | POA: Diagnosis present

## 2015-12-18 DIAGNOSIS — Y95 Nosocomial condition: Secondary | ICD-10-CM | POA: Diagnosis present

## 2015-12-18 DIAGNOSIS — Z8249 Family history of ischemic heart disease and other diseases of the circulatory system: Secondary | ICD-10-CM

## 2015-12-18 DIAGNOSIS — D62 Acute posthemorrhagic anemia: Secondary | ICD-10-CM | POA: Diagnosis not present

## 2015-12-18 DIAGNOSIS — J9601 Acute respiratory failure with hypoxia: Secondary | ICD-10-CM | POA: Diagnosis not present

## 2015-12-18 DIAGNOSIS — I110 Hypertensive heart disease with heart failure: Secondary | ICD-10-CM | POA: Diagnosis not present

## 2015-12-18 DIAGNOSIS — I4430 Unspecified atrioventricular block: Secondary | ICD-10-CM | POA: Insufficient documentation

## 2015-12-18 DIAGNOSIS — M069 Rheumatoid arthritis, unspecified: Secondary | ICD-10-CM | POA: Diagnosis present

## 2015-12-18 LAB — URINE MICROSCOPIC-ADD ON

## 2015-12-18 LAB — CBC WITH DIFFERENTIAL/PLATELET
BASOS ABS: 0 10*3/uL (ref 0.0–0.1)
Basophils Relative: 0 %
Eosinophils Absolute: 0 10*3/uL (ref 0.0–0.7)
Eosinophils Relative: 0 %
HEMATOCRIT: 32.1 % — AB (ref 36.0–46.0)
HEMOGLOBIN: 9.7 g/dL — AB (ref 12.0–15.0)
LYMPHS ABS: 1.5 10*3/uL (ref 0.7–4.0)
LYMPHS PCT: 12 %
MCH: 25.2 pg — ABNORMAL LOW (ref 26.0–34.0)
MCHC: 30.2 g/dL (ref 30.0–36.0)
MCV: 83.4 fL (ref 78.0–100.0)
MONOS PCT: 4 %
Monocytes Absolute: 0.5 10*3/uL (ref 0.1–1.0)
NEUTROS PCT: 84 %
Neutro Abs: 10.9 10*3/uL — ABNORMAL HIGH (ref 1.7–7.7)
Platelets: 352 10*3/uL (ref 150–400)
RBC: 3.85 MIL/uL — AB (ref 3.87–5.11)
RDW: 18.5 % — AB (ref 11.5–15.5)
WBC: 12.9 10*3/uL — AB (ref 4.0–10.5)

## 2015-12-18 LAB — BASIC METABOLIC PANEL
ANION GAP: 8 (ref 5–15)
BUN: 9 mg/dL (ref 6–20)
CALCIUM: 7.6 mg/dL — AB (ref 8.9–10.3)
CO2: 27 mmol/L (ref 22–32)
Chloride: 106 mmol/L (ref 101–111)
Creatinine, Ser: 0.57 mg/dL (ref 0.44–1.00)
GFR calc non Af Amer: >60 mL/min (ref >60)
Glucose, Bld: 97 mg/dL (ref 65–99)
Potassium: 3.4 mmol/L — ABNORMAL LOW (ref 3.5–5.1)
Sodium: 141 mmol/L (ref 135–145)

## 2015-12-18 LAB — URINALYSIS, ROUTINE W REFLEX MICROSCOPIC
Bilirubin Urine: NEGATIVE
Glucose, UA: NEGATIVE mg/dL
KETONES UR: NEGATIVE mg/dL
LEUKOCYTES UA: NEGATIVE
NITRITE: NEGATIVE
PH: 7 (ref 5.0–8.0)
Protein, ur: NEGATIVE mg/dL
Specific Gravity, Urine: 1.015 (ref 1.005–1.030)

## 2015-12-18 LAB — GLUCOSE, CAPILLARY: Glucose-Capillary: 73 mg/dL (ref 65–99)

## 2015-12-18 LAB — BRAIN NATRIURETIC PEPTIDE: B Natriuretic Peptide: 345.4 pg/mL — ABNORMAL HIGH (ref 0.0–100.0)

## 2015-12-18 LAB — TROPONIN I: TROPONIN I: 0.05 ng/mL — AB (ref <0.031)

## 2015-12-18 MED ORDER — VANCOMYCIN HCL IN DEXTROSE 1-5 GM/200ML-% IV SOLN: 1000.0000 mg | INTRAVENOUS | Status: DC

## 2015-12-18 MED ORDER — SODIUM CHLORIDE 0.9% FLUSH
3.0000 mL | Freq: Two times a day (BID) | INTRAVENOUS | Status: DC
  Administered 2015-12-19 – 2015-12-20 (×2): 3 mL via INTRAVENOUS

## 2015-12-18 MED ORDER — OXYCODONE-ACETAMINOPHEN 5-325 MG PO TABS
1.0000 | ORAL_TABLET | Freq: Four times a day (QID) | ORAL | Status: DC | PRN
  Administered 2015-12-18 – 2015-12-20 (×2): 1 via ORAL
  Filled 2015-12-18 (×2): qty 1

## 2015-12-18 MED ORDER — DEXTROSE 5 % IV SOLN
1.0000 g | Freq: Once | INTRAVENOUS | Status: AC
  Administered 2015-12-18: 1 g via INTRAVENOUS
  Filled 2015-12-18: qty 1

## 2015-12-18 MED ORDER — ACETAMINOPHEN 650 MG RE SUPP: 650.0000 mg | Freq: Four times a day (QID) | RECTAL | Status: DC | PRN

## 2015-12-18 MED ORDER — ALBUTEROL SULFATE (2.5 MG/3ML) 0.083% IN NEBU: 2.5000 mg | INHALATION_SOLUTION | RESPIRATORY_TRACT | Status: DC | PRN

## 2015-12-18 MED ORDER — VANCOMYCIN HCL IN DEXTROSE 1-5 GM/200ML-% IV SOLN
1000.0000 mg | Freq: Three times a day (TID) | INTRAVENOUS | Status: DC
  Filled 2015-12-18: qty 200

## 2015-12-18 MED ORDER — SODIUM CHLORIDE 0.9% FLUSH: 3.0000 mL | INTRAVENOUS | Status: DC | PRN

## 2015-12-18 MED ORDER — SODIUM CHLORIDE 0.9 % IV SOLN: 250.0000 mL | INTRAVENOUS | Status: DC | PRN

## 2015-12-18 MED ORDER — MYCOPHENOLATE MOFETIL 250 MG PO CAPS
500.0000 mg | ORAL_CAPSULE | Freq: Two times a day (BID) | ORAL | Status: DC
  Administered 2015-12-18 – 2015-12-20 (×4): 500 mg via ORAL
  Filled 2015-12-18 (×4): qty 2

## 2015-12-18 MED ORDER — DEXTROSE 5 % IV SOLN
1.0000 g | Freq: Three times a day (TID) | INTRAVENOUS | Status: DC
  Administered 2015-12-19: 1 g via INTRAVENOUS
  Filled 2015-12-18 (×3): qty 1

## 2015-12-18 MED ORDER — VANCOMYCIN HCL IN DEXTROSE 1-5 GM/200ML-% IV SOLN
1000.0000 mg | Freq: Three times a day (TID) | INTRAVENOUS | Status: DC
  Administered 2015-12-18 – 2015-12-19 (×2): 1000 mg via INTRAVENOUS
  Filled 2015-12-18 (×4): qty 200

## 2015-12-18 MED ORDER — POLYETHYLENE GLYCOL 3350 17 G PO PACK: 17.0000 g | PACK | Freq: Every day | ORAL | Status: DC | PRN

## 2015-12-18 MED ORDER — PREDNISONE 20 MG PO TABS
30.0000 mg | ORAL_TABLET | Freq: Every day | ORAL | Status: DC
  Administered 2015-12-18 – 2015-12-19 (×2): 30 mg via ORAL
  Filled 2015-12-18 (×2): qty 1

## 2015-12-18 MED ORDER — SODIUM CHLORIDE 0.9% FLUSH
3.0000 mL | Freq: Two times a day (BID) | INTRAVENOUS | Status: DC
  Administered 2015-12-18 – 2015-12-19 (×2): 3 mL via INTRAVENOUS

## 2015-12-18 MED ORDER — FUROSEMIDE 10 MG/ML IJ SOLN
40.0000 mg | Freq: Once | INTRAMUSCULAR | Status: AC
  Administered 2015-12-18: 40 mg via INTRAVENOUS
  Filled 2015-12-18: qty 4

## 2015-12-18 MED ORDER — ACETAMINOPHEN 325 MG PO TABS: 650.0000 mg | ORAL_TABLET | Freq: Four times a day (QID) | ORAL | Status: DC | PRN

## 2015-12-18 MED ORDER — ENOXAPARIN SODIUM 40 MG/0.4ML ~~LOC~~ SOLN
40.0000 mg | SUBCUTANEOUS | Status: DC
  Administered 2015-12-18 – 2015-12-19 (×2): 40 mg via SUBCUTANEOUS
  Filled 2015-12-18 (×2): qty 0.4

## 2015-12-18 MED ORDER — MOMETASONE FURO-FORMOTEROL FUM 100-5 MCG/ACT IN AERO
2.0000 | INHALATION_SPRAY | Freq: Two times a day (BID) | RESPIRATORY_TRACT | Status: DC
  Administered 2015-12-19 (×2): 2 via RESPIRATORY_TRACT
  Filled 2015-12-18 (×3): qty 8.8
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
## 2015-12-18 NOTE — ED Notes (Signed)
Bed: [facility] Expected date:  Expected time:  Means of arrival:  Comments: 

## 2015-12-18 NOTE — ED Notes (Signed)
Attempted blood draw with no success.     

## 2015-12-18 NOTE — ED Notes (Signed)
Pt was discharge yesterday and she states that she was not ready to be discharge. Was given a script for UTI but did not get it filled. Called ems for weakness. States that she has not been able to walk but none of the doc have addressed this. On contious 4l ox [place],

## 2015-12-18 NOTE — Progress Notes (Signed)
ANTIBIOTIC CONSULT NOTE - INITIAL  Pharmacy Consult for vancomycin and cefepime Indication: pneumonia  Allergies  Allergen Reactions  . Sertraline Hcl Hives  . Tape Other (See Comments)    Burns skin    Patient Measurements:  Vital Signs: Temp: 100.2 F (37.9 C) (02/28 1833) Temp Source: Oral (02/28 1833) BP: 96/72 mmHg (02/28 1833) Pulse Rate: 141 (02/28 1833) Intake/Output from previous day:   Intake/Output from this shift:    Labs:  Recent Labs  12/16/15 0616 12/17/15 0916 12/18/15 1445  WBC 5.4 9.8 12.9*  HGB 8.8* 9.6* 9.7*  PLT 331 397 352  CREATININE  --  0.60 0.57   Estimated Creatinine Clearance: 101.6 mL/min (by C-G formula based on Cr of 0.57). No results for input(s): VANCOTROUGH, VANCOPEAK, VANCORANDOM, GENTTROUGH, GENTPEAK, GENTRANDOM, TOBRATROUGH, TOBRAPEAK, TOBRARND, AMIKACINPEAK, AMIKACINTROU, AMIKACIN in the last 72 hours.   Microbiology: Recent Results (from the past 720 hour(s))  Urine culture     Status: None   Collection Time: 12/14/15  1:35 AM  Result Value Ref Range Status   Specimen Description URINE, CATHETERIZED  Final   Special Requests NONE  Final   Culture   Final    >=100,000 COLONIES/mL ESCHERICHIA COLI Performed at [facility]    Report Status 12/16/2015 FINAL  Final   Organism ID, Bacteria ESCHERICHIA COLI  Final      Susceptibility   Escherichia coli - MIC*    AMPICILLIN >=32 RESISTANT Resistant     CEFAZOLIN <=4 SENSITIVE Sensitive     CEFTRIAXONE <=1 SENSITIVE Sensitive     CIPROFLOXACIN <=0.25 SENSITIVE Sensitive     GENTAMICIN <=1 SENSITIVE Sensitive     IMIPENEM <=0.25 SENSITIVE Sensitive     NITROFURANTOIN <=16 SENSITIVE Sensitive     TRIMETH/SULFA <=20 SENSITIVE Sensitive     AMPICILLIN/SULBACTAM >=32 RESISTANT Resistant     PIP/TAZO <=4 SENSITIVE Sensitive     * >=100,000 COLONIES/mL ESCHERICHIA COLI    Medical History: Past Medical History  Diagnosis Date  . Cardiac pacemaker     a. Since age 24  in 39. b. Upgrade to BiV in 2013.  . Congenital complete AV block   . Obesity   . GERD (gastroesophageal reflux disease)   . Asymptomatic LV dysfunction     a. Echo in Dec 2011 with EF 35 to 40%. Felt to be due to paced rhythm. b. EF 25-30% in 07/2014.  . Seizures (HCC)     as a child- from high fever  . Anxiety   . Bipolar affective disorder (HCC)   . Depression     bipolar  . Carpal tunnel syndrome of right wrist   . Asthma     seasonal allergies   . Arthritis     rheumatoid arthritis- mild, no rheumatology care   . Hypertension   . Pneumonitis     a. a/w hypoxia - inflammatory - large workup 07/2014.  [name] Sinus tachycardia (HCC)   . Diabetes mellitus without complication (HCC)   . Presence of permanent cardiac pacemaker   . CHF (congestive heart failure) (HCC)   . Lupus (HCC)   . Lupus (systemic lupus erythematosus) (HCC)    Assessment: 35 YOF with hx of heart failure, congenital AV block, lupus on chronic prednisone who was discharged yesterday on a course of rocephin > keflex for e.coli pyelonephritis. She is now readmitted with weakness. Chest Xray shows possible pneumonia. Pharmacy is consulted to dose vancomycin and cefepime for pneumonia. Scr 0.57, est. crcl > 100 ml/min.  Wbc up 9.8K > 12.9K. Pt received 1 dose of cefepime 1g at 1645 today.  2/28 vanc >>  2/28 cefepime >>   2/28 urine - 2/24 urine - e.coli (s - ancef, rocephin, cipro, gent, imi, nitro, zosyn, bactrim)  Goal of Therapy:  Vancomycin trough level 15-20 mcg/ml  Plan:  Vanc 1gm q8h Cefepime 1g Q8 hrs Monitor renal function and f/u cultures Vancomycin trough at steady state.  Bayard Hugger, PharmD, BCPS  Clinical Pharmacist  Pager: [phone]   12/18/2015,7:25 PM

## 2015-12-18 NOTE — Progress Notes (Signed)
Pt a/o, 98.1, 82/44, HR 125, 90% 4L, pt stable at this time, spoke with MD @ 2125 regarding possible sepsis, WBC 12.9, trop 0.05, pt had recent hospital admit with sepsis, MD ordered bld cx and lactic acid, will transfer to step down as originally planned

## 2015-12-18 NOTE — Progress Notes (Signed)
Pharmacy Antibiotic Note  Makayla Vasquez is a 35 y.o. female admitted on 12/18/2015 with pneumonia.  Pharmacy has been consulted for Vancomycin dosing. Discharged from [facility] yesterday.  Presents to ED with c/o weakness. Chest Xray shows possible pneumonia.  Plan: Vancomycin 1gm IV q8h Cefepime 1gm IV x 1 dose per MD F/u cultures and sensitivities F/u continuation of Cefepime    No data recorded.   Recent Labs Lab 12/14/15 0119 12/14/15 0205 12/14/15 0636 12/15/15 1636 12/16/15 0616 12/17/15 0916 12/18/15 1445  WBC 11.1*  --   --  6.3 5.4 9.8 12.9*  CREATININE 0.56  --   --  0.59  --  0.60 0.57  LATICACIDVEN  --  2.43* 0.97  --   --   --   --     Estimated Creatinine Clearance: 101.6 mL/min (by C-G formula based on Cr of 0.57).    Allergies  Allergen Reactions  . Sertraline Hcl Hives  . Tape Other (See Comments)    Burns skin    Antimicrobials this admission: 2/28 vanc >>   2/28 Cefepime x 1 dose   Dose adjustments this admission:    Microbiology results: 2/28 UCx:     Thank you for allowing pharmacy to be a part of this patient's care.  Maryellen Pile, PharmD 12/18/2015 4:17 PM

## 2015-12-18 NOTE — H&P (Signed)
[facility] Admission History and Physical Service Pager: [phone]  Patient name: Makayla Vasquez Medical record number: [id number] Date of birth: 06-May-1981 Age: 35 y.o. Gender: female  Primary Care Provider: Nobie Putnam, DO Consultants: none Code Status: FULL  Chief Complaint:   Assessment and Plan: Makayla Vasquez is a 34 y.o. female presenting with LE weakness and pain, urinary incontinence, and shortness of breath. PMH is significant for HFrEF (EF 40-45%), congenital AV block with pace maker, Lupus on chronic prednisone and immunosuppressive therapy.   LE Weakness/Pain: unclear in etiology. Possibly acute worsening of chronic issue. Notes of urinary incontinence, but no bowel incontinence. No sensory deficits. No back tenderness or back pain.  - admit to teaching service, step down unit, attending Dr. Dorcas Mcmurray  - x-ray lumbar spine to evaluate for possible compression fracture since patient on chronic steroids - Percocet PRN pain  - PT/OT eval - Check TSH, B12  HCAP: concern for HCAP given bilateral interstitial and alveolar airspace opacities on CXR which may reflect pulmonary edema vs. Multilobar PNA. Mildly elevated WBC at 12.9 but has been on Prednisone. No fever. Lung exam is unremarkable. 92% on home 4L. Does have mild tachypnea. S/p Vanc and Cefepime at [facility] ED - continue Vancomycin and Cefepime  - blood cultures (obtained after abx)   - continuous pulse ox - supplemental O2 to keep O2 >92% (on 4L chronically) - AM CBC - lactic acid  - Repeat CXR in AM - if cleared and patient without fever or other respiratory symptoms, consider stopping antibiotics (will still need antibiotics for recent UTI/pyelonephritis)  HFrEF (EF40-45%):  BNP elevated at 345.4 (from 2/24 at 45.7) in ED. S/p IV Lasix 3m in ED. CXR with signs of possible pulmonary edema although lung exam is unremarkable for crackles. No clinical signs of fluid overload.  -  holding home Lasix (SBP 90s, which is her normal) as she received IV Lasix in ED  - Daily weights and strict I/O - Recheck CXR in AM  Chest Pain: somewhat reassuring as there seems to be a msk component as pt has tenderness to palpation. EKG in ED poor tracing.  - STAT EKG, repeat in AM - troponin 0.05; will trend  - telemetry   Hypotension. Patient with persistently low BPs in 80s/40s. This is near her baseline. - Continue to monitor - Consider stress dose steroids if decreasing below her typical range  T2DM: Home meds: Metformin 10051mBID, Lantus 35 units daily. CBG 73 on admission.  - heart healthy carb modified diet - consider starting sensitive SSI when CBGs increase  Interstitial Lung Disease, on chronic prednisone and immunosuppressive therapy: On 4L O2 at home  - supplemental O2, to keep O2 > 92% - continue home Cellcept  - Patient was discharged on Prednisone taper: 3064mor 5 days, then 29m102mr 5 days, then back to home dose of 10mg73mly. Will continue the taper with Prednisone 30mg 43my  - cont Dulera BID PRN and Albuterol PRN   Congenital AVN Block s/p atrial pacer (placed at age 17yo):60yoon telemetry   Recent Pyelonephritis: discharged 2/27, patient did not take medication at home (was discharged with 9 days of Keflex). UA with trace hgb otherwise unremarkable.  - currently on Vancomycin and Cefepime  - urine culture obtained in ED  FEN/GI: heart healthy carb modified Prophylaxis: Lovenox Sub Q  Disposition: admit to teaching service for management   History of Present Illness:  Makayla Vasquez  is a 35 y.o. female presenting with LE weakness and pain, urinary incontinence, and shortness of breath. Patient was recently discharged from the hospital on 12/17/15 for sepsis 2/2 pyelonephritis. Patient reports she could hardly move legs and couldn't control her urine; this started last night. Patient reports of LE weakness where her legs would give out for the past 2  months. Upon further questioning, patient reports she has not been able to walk much for the past 2 weeks, but symptoms worsened last night. States that no one has worked up for her LE weakness. Denies fecal incontinence. Notes of mild increased work of breathing; she is on 4L O2 at home. No LE swelling. Notes of fevers and chills since being discharged from the hospital. Denies dysuria. Notes of some nausea, but no vomiting. Patient has not taken any of her medications since she was discharged. Notes of sharp central chest pain which started in the past 30 minutes; pain does not radiate.   Review Of Systems: Per HPI Otherwise the remainder of the systems were negative.  Patient Active Problem List   Diagnosis Date Noted  . HCAP (healthcare-associated pneumonia) 12/18/2015  . Acute pyelonephritis   . Palpitations   . Complete heart block ([place])   . Cardiac pacemaker in situ   . UTI (urinary tract infection) with pyuria   . Diarrhea 12/14/2015  . Viral gastroenteritis 12/14/2015  . Lower limb pain, anterior   . Calf pain   . Adrenal insufficiency ([place])   . Acute on chronic diastolic CHF (congestive heart failure) ([place])   . Arterial hypotension   . SOB (shortness of breath)   . Lupus (systemic lupus erythematosus) ([place])   . Congenital heart block   . Chronic diastolic congestive heart failure ([place])   . Hypotension 07/08/2015  . Fatigue 06/05/2015  . Nausea with vomiting 06/05/2015  . Hyperlipidemia associated with type 2 diabetes mellitus ([place]) 05/04/2015  . Polyarthralgia 04/26/2015  . Rhinosinusitis 04/19/2015  . Screening for cervical cancer 01/19/2015  . Onychomycosis of right great toe 12/14/2014  . Discoloration of skin 12/01/2014  . Lip abscess 11/02/2014  . CHF (congestive heart failure) ([place])   . Diabetes mellitus type 2 in obese ([place])   . Hyperglycemia 10/24/2014  . Dehydration   . Sinus tachycardia ([place])   . Chronic respiratory failure with hypoxia ([place]) 08/07/2014  .  Atrial tachycardia ([place]) 07/26/2014  . Insertion of implantable subdermal contraceptive 07/12/2014  . Acute respiratory failure with hypoxia ([place]) 06/19/2014  . ILD (interstitial lung disease) ([place]) 06/19/2014  . Pulmonary infiltrates 06/17/2014  . Dyspnea 06/16/2014  . Hypoxemia 06/16/2014  . Congestive dilated cardiomyopathy ([place]) 05/31/2014  . Nonallergic rhinitis 05/22/2014  . Vaginal itching 05/22/2014  . Pneumonia 05/09/2014  . Shortness of breath 05/08/2014  . Sterilization consult 04/20/2014  . Breast tenderness 01/18/2014  . Carpal tunnel syndrome 09/10/2012  . Constipation 03/03/2012  . Chronic systolic heart failure ([place]) 02/26/2012  . Chest pain, unspecified 12/29/2011  . Post-nasal drip 10/24/2011  . Contraception 01/29/2011  . Heart palpitations   . PPM-Medtronic   . Congenital complete AV block   . Bipolar affective disorder ([place])   . Obesity   . GERD (gastroesophageal reflux disease)   . Hypertension 01/05/2011  . THYROGLOSSAL DUCT CYST 07/18/2010  . UNSPECIFIED CARDIAC DYSRHYTHMIA 05/07/2010  . RH FACTOR, NEGATIVE 04/08/2010  . Mild intermittent asthma without complication 62/83/6629  . 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 HEART BLOCK 12/17/2006  . IRRITABLE BOWEL SYNDROME 12/17/2006  Past Medical History: Past Medical History  Diagnosis Date  . Cardiac pacemaker     a. Since age 25 in 30. b. Upgrade to BiV in 2013.  . Congenital complete AV block   . Obesity   . GERD (gastroesophageal reflux disease)   . Asymptomatic LV dysfunction     a. Echo in Dec 2011 with EF 35 to 40%. Felt to be due to paced rhythm. b. EF 25-30% in 07/2014.  . Seizures ([place])     as a child- from high fever  . Anxiety   . Bipolar affective disorder ([place])   . Depression     bipolar  . Carpal tunnel syndrome of right wrist   . Asthma     seasonal allergies   . Arthritis     rheumatoid arthritis- mild, no rheumatology care   . Hypertension   . Pneumonitis     a. a/w hypoxia  - inflammatory - large workup 07/2014.  [name] Sinus tachycardia ([place])   . Diabetes mellitus without complication ([place])   . Presence of permanent cardiac pacemaker   . CHF (congestive heart failure) ([place])   . Lupus ([place])   . Lupus (systemic lupus erythematosus) ([place])     Past Surgical History: Past Surgical History  Procedure Laterality Date  . Throat surgery  1994    s/p laser treatment  . Cesarean section    . Insert / replace / remove pacemaker      2001  . Cholecystectomy    . Iud removal  11/03/2011    Procedure: INTRAUTERINE DEVICE (IUD) REMOVAL;  Surgeon: [name], MD;  Location: [facility];  Service: Gynecology;  Laterality: N/A;  . Cyst excision  12/10/2012    THYROID  . Thyroglossal duct cyst N/A 12/10/2012    Procedure: REVISION OF THYROGLOSSAL DUCT CYST EXCISION;  Surgeon: Makayla Gala, MD;  Location: [facility];  Service: ENT;  Laterality: N/A;  Revision of Thyroglossal Duct Cyst Excision  . Nasal fracture surgery      /w plate   . Carpal tunnel with cubital tunnel Right 07/26/2013    Procedure: RIGHT LIMITED OPEN CARPAL TUNNEL RELEASE ,  RIGHT CUBITAL TUNNEL RELEASE, INSITU VERSES ULNAR NERVE DECOMPRESSION AND ANTERIOR TRANSPOSITION;  Surgeon: [name], MD;  Location: [facility];  Service: Orthopedics;  Laterality: Right;  . Video bronchoscopy Bilateral 06/19/2014    Procedure: VIDEO BRONCHOSCOPY WITHOUT FLUORO;  Surgeon: Brand Males, MD;  Location: [facility];  Service: Cardiopulmonary;  Laterality: Bilateral;  . Bi-ventricular pacemaker upgrade N/A 03/08/2012    Procedure: BI-VENTRICULAR PACEMAKER UPGRADE;  Surgeon: [name], MD;  Location: [facility] CATH LAB;  Service: Cardiovascular;  Laterality: N/A;  . Atrial tach ablation N/A 08/14/2014    Procedure: ATRIAL TACH ABLATION;  Surgeon: [name], MD;  Location: [facility] CATH LAB;  Service: Cardiovascular;  Laterality: N/A;  . Right heart catheterization N/A 10/26/2014    Procedure: RIGHT HEART CATH;  Surgeon: [name], MD;  Location: [facility] CATH LAB;  Service: Cardiovascular;  Laterality: N/A;    Social History: Social History  Substance Use Topics  . Smoking status: Former Smoker -- 0.25 packs/day for .5 years    Types: Cigarettes    Quit date: 07/25/1996  . Smokeless tobacco: Never Used  . Alcohol Use: No   Please also refer to relevant sections of EMR.  Family History: Family History  Problem Relation Age of Onset  . Heart disease Mother     CHF (no details)  .  Hypertension Mother   . Heart disease Father     Murmur  . Heart disease Sister 36     No details.  History of a pacemaker  . Lupus Mother     Allergies and Medications: Allergies  Allergen Reactions  . Sertraline Hcl Hives  . Tape Other (See Comments)    Burns skin   No current facility-administered medications on file prior to encounter.   Current Outpatient Prescriptions on File Prior to Encounter  Medication Sig Dispense Refill  . albuterol (PROVENTIL HFA;VENTOLIN HFA) 108 (90 BASE) MCG/ACT inhaler Inhale 2 puffs into the lungs every 4 (four) hours as needed for wheezing or shortness of breath. 1 each 1  . atorvastatin (LIPITOR) 40 MG tablet Take 1 tablet (40 mg total) by mouth daily. 90 tablet 3  . bisoprolol (ZEBETA) 5 MG tablet Take 2.5 mg by mouth daily.    . budesonide-formoterol (SYMBICORT) 80-4.5 MCG/ACT inhaler Inhale 2 puffs into the lungs 2 (two) times daily as needed (for shortness of breath).     . calcium-vitamin D (OSCAL WITH D) 500-200 MG-UNIT per tablet Take 1 tablet by mouth daily with breakfast.     . cephALEXin (KEFLEX) 500 MG capsule Take 1 capsule (500 mg total) by mouth every 6 (six) hours. 36 capsule 0  . etonogestrel (NEXPLANON) 68 MG IMPL implant 1 each by Subdermal route once.    . fluocinonide (LIDEX) 0.05 % external solution 1 APPLICATION APPLY ON THE SKIN AS DIRECTED APPLY TO AFFECTED AREAS DAILY ON SCALP AS NEEDED FOR ITCHING.  5  . Insulin Glargine (LANTUS SOLOSTAR) 100 UNIT/ML  Solostar Pen Inject 35 Units into the skin daily at 10 pm. (Patient taking differently: Inject 35 Units into the skin daily as needed (low blood sugar). ) 15 mL 5  . metFORMIN (GLUCOPHAGE) 1000 MG tablet TAKE 1 TABLET BY MOUTH TWICE A DAY WITH A MEAL 30 tablet 11  . mycophenolate (CELLCEPT) 500 MG tablet Take 500 mg by mouth 2 (two) times daily.    . Nutritional Supplements (GLUCERNA ADVANCE SHAKE) LIQD Take supplement daily in place of 1 meal. (Patient taking differently: Take 1 Can by mouth daily as needed (meal replacement). Take supplement daily in place of 1 meal.) 30 Bottle 2  . oxyCODONE-acetaminophen (PERCOCET) 5-325 MG tablet Take 1 tablet by mouth every 6 (six) hours as needed for severe pain. 60 tablet 0  . potassium chloride SA (KLOR-CON M20) 20 MEQ tablet Take 1 tablet (20 mEq total) by mouth daily. 30 tablet 2  . Blood Glucose Monitoring Suppl (ONE TOUCH ULTRA 2) W/DEVICE KIT Check blood sugar daily before breakfast 1 each 0  . glucose blood (ONE TOUCH ULTRA TEST) test strip Check blood sugar daily before breakfast 100 each 0  . Insulin Pen Needle (INSUPEN PEN NEEDLES) 32G X 4 MM MISC Use with insulin pen 30 each 0  . predniSONE (DELTASONE) 10 MG tablet Take 3 tablets for 5 days, then 2 tablets for 5 days, then return 1 tablet daily (Patient not taking: Reported on 12/18/2015) 90 tablet 0    Objective: BP 100/64 mmHg  Pulse 73  Resp 60  SpO2 94% Exam: General: awake and alert. NAD. Speaking in full sentences.  Eyes: PERRL, EOMI ENTM: MMM Neck: normal ROM Chest: tenderness to palpation of chest wall Cardiovascular: Tachycardic, RRR, no m/r/g Respiratory: increased effort, CTAB no crackles or wheezing, on 2L with 92% sats  Abdomen: soft, diffusely TTP, no rebound, + BS MSK: tender to palpation and  passive movement of LE; no LE edema Skin: warm and dry Neuro: CN 2-12 grossly intact (visual fields not tested), 4/5 in upper extremities, 3/5 (some movement against gravity) of lower  extremities. Normal sensation to light touch in upper and lower extremities. Diminished patellar reflexes, but normal achilles reflex bilaterally. Normal tone in lower extremities. No clonus. Patient observed moving lower extremities spontaneously while examining other organ systems.    Psych: normal mood appropriate affect, normal speech   Labs and Imaging: CBC BMET   Recent Labs Lab 12/18/15 1445  WBC 12.9*  HGB 9.7*  HCT 32.1*  PLT 352    Recent Labs Lab 12/18/15 1445  NA 141  K 3.4*  CL 106  CO2 27  BUN 9  CREATININE 0.57  GLUCOSE 97  CALCIUM 7.6*     BNP 345.4 Trop 0.05  Dg Chest 2 View  12/18/2015  CLINICAL DATA:  Cough, lupus, history of asthma EXAM: CHEST  2 VIEW COMPARISON:  CT chest 12/10/2015 FINDINGS: There are bilateral interstitial and alveolar airspace opacities. There are bilateral small pleural effusions. There is no pneumothorax. The heart and mediastinal contours are unremarkable. There is a cardiac pacemaker. The osseous structures are unremarkable. IMPRESSION: Bilateral interstitial and alveolar airspace opacities which may reflect pulmonary edema versus multi lobar pneumonia. Electronically Signed   By: Kathreen Devoid   On: 12/18/2015 14:07   Dg Lumbar Spine Complete  12/18/2015  CLINICAL DATA:  Lower extremity weakness for 2 months. EXAM: LUMBAR SPINE - COMPLETE 4+ VIEW COMPARISON:  CT, 12/14/2015 FINDINGS: L5 is a transitional lumbosacral vertebrae. There is no fracture spondylolisthesis. There are no bone lesions. No degenerative changes. Mild straightening of the normal lumbar lordosis unchanged from the CT. Soft tissues are unremarkable. IMPRESSION: 1. No fracture or acute finding. 2. Mild straightening of the normal lumbar lordosis and transitional lumbosacral vertebrae. Otherwise unremarkable. Electronically Signed   By: Lajean Manes M.D.   On: 12/18/2015 23:44   Smiley Houseman, MD 12/18/2015, 6:30 PM PGY-1, [place]  Intern pager: [phone], text pages welcome  I have read the above note and made revisions highlighted in blue.  Algis Greenhouse. Jerline Pain, [place] Resident PGY-2 12/19/2015 12:06 AM

## 2015-12-18 NOTE — Progress Notes (Signed)
New pt admission from [facility] long ED brought via carelink. Carelink had concerns for pt potentially needing higher level of care. Teaching Service paged. Pt brought to unit. Vitals taken. Pt hypotensive, temp 100.2 oral, and tachycardic. Pt on 4 L [place] sat 88%. Teaching service paged regarding pt arrival and pt condition. Teaching service arrived to bedside right away. Will continue to monitor.  Jilda Panda RN

## 2015-12-18 NOTE — ED Provider Notes (Signed)
CSN: [id number]     Arrival date & time 12/18/15  1209 History   First MD Initiated Contact with Patient 12/18/15 1249     Chief Complaint  Patient presents with  . Weakness     HPI  Makayla Vasquez is an 35 y.o. female with history of congenital AV block, CHF, SLE, inflammatory pneumonitis, DM who presents to the ED for evaluation of generalized weakness. She was notably discharged from the hospital yesterday after and admission for sepsis thought to be secondary to pyelonephritis. Pt was given rx for Keflex which she did not pick up. She states she did not feel well enough to be discharged. She states today she feels extremely weak and tired. She states she has chills all over. She denies fever, chest pain, SOB. States she has been coughing but unclear if this is her baseline chronic cough or increased. She denies urinary symptoms. Denies abdominal pain. Endorses intermittent nausea without emesis. Her biggest concern today is weakness in her legs in particular. She states that she has chronically had intermittent issues with bilateral leg weakness but states today it is ithe worse it has ever been, states she cannot stand or bear weight at all. Denies back pain, injury trauma. Denies numbness or tingling.  In the ED pt appears chronically ill. She is unwell appearing. She is pale. She is on 4L of O2 via [place] which is her baseline. She is maintaining SpO2 90-92% with no increased WOB, no tachypnea, no accessory muscle usage. She has some mild diffuse abdominal tenderness. No CVA tenderness. No midline spine tenderness. Her UE exam is normal bilaterally. On exam she is able to wiggle her toes bilaterally but states she cannot lift her legs up laying in bed, even with much encouragement. Sensation is intact. 2+ distal pulses bilaterally.     Past Medical History  Diagnosis Date  . Cardiac pacemaker     a. Since age 72 in 64. b. Upgrade to BiV in 2013.  . Congenital complete AV block   . Obesity   .  GERD (gastroesophageal reflux disease)   . Asymptomatic LV dysfunction     a. Echo in Dec 2011 with EF 35 to 40%. Felt to be due to paced rhythm. b. EF 25-30% in 07/2014.  . Seizures ([place])     as a child- from high fever  . Anxiety   . Bipolar affective disorder ([place])   . Depression     bipolar  . Carpal tunnel syndrome of right wrist   . Asthma     seasonal allergies   . Arthritis     rheumatoid arthritis- mild, no rheumatology care   . Hypertension   . Pneumonitis     a. a/w hypoxia - inflammatory - large workup 07/2014.  [name] Sinus tachycardia ([place])   . Diabetes mellitus without complication ([place])   . Presence of permanent cardiac pacemaker   . CHF (congestive heart failure) ([place])   . Lupus ([place])   . Lupus (systemic lupus erythematosus) ([place])    Past Surgical History  Procedure Laterality Date  . Throat surgery  1994    s/p laser treatment  . Cesarean section    . Insert / replace / remove pacemaker      2001  . Cholecystectomy    . Iud removal  11/03/2011    Procedure: INTRAUTERINE DEVICE (IUD) REMOVAL;  Surgeon: [name], MD;  Location: [facility];  Service: Gynecology;  Laterality: N/A;  . Cyst excision  12/10/2012    THYROID  . Thyroglossal duct cyst N/A 12/10/2012    Procedure: REVISION OF THYROGLOSSAL DUCT CYST EXCISION;  Surgeon: [name], MD;  Location: [facility];  Service: ENT;  Laterality: N/A;  Revision of Thyroglossal Duct Cyst Excision  . Nasal fracture surgery      /w plate   . Carpal tunnel with cubital tunnel Right 07/26/2013    Procedure: RIGHT LIMITED OPEN CARPAL TUNNEL RELEASE ,  RIGHT CUBITAL TUNNEL RELEASE, INSITU VERSES ULNAR NERVE DECOMPRESSION AND ANTERIOR TRANSPOSITION;  Surgeon: [name], MD;  Location: [facility];  Service: Orthopedics;  Laterality: Right;  . Video bronchoscopy Bilateral 06/19/2014    Procedure: VIDEO BRONCHOSCOPY WITHOUT FLUORO;  Surgeon: Brand Males, MD;  Location: [place] [facility];  Service: Cardiopulmonary;  Laterality: Bilateral;   . Bi-ventricular pacemaker upgrade N/A 03/08/2012    Procedure: BI-VENTRICULAR PACEMAKER UPGRADE;  Surgeon: [name], MD;  Location: [facility];  Service: Cardiovascular;  Laterality: N/A;  . Atrial tach ablation N/A 08/14/2014    Procedure: ATRIAL TACH ABLATION;  Surgeon: [name], MD;  Location: [facility] CATH LAB;  Service: Cardiovascular;  Laterality: N/A;  . Right heart catheterization N/A 10/26/2014    Procedure: RIGHT HEART CATH;  Surgeon: [name], MD;  Location: [facility] CATH LAB;  Service: Cardiovascular;  Laterality: N/A;   Family History  Problem Relation Age of Onset  . Heart disease Mother     CHF (no details)  . Hypertension Mother   . Heart disease Father     Murmur  . Heart disease Sister 35     No details.  History of a pacemaker  . Lupus Mother    Social History  Substance Use Topics  . Smoking status: Former Smoker -- 0.25 packs/day for .5 years    Types: Cigarettes    Quit date: 07/25/1996  . Smokeless tobacco: Never Used  . Alcohol Use: No   OB History    Gravida Para Term Preterm AB TAB SAB Ectopic Multiple Living   _0 Review of Systems  All other systems reviewed and are negative.     Allergies  Sertraline hcl and Tape  Home Medications   Prior to Admission medications   Medication Sig Start Date End Date Taking? Authorizing Provider  albuterol (PROVENTIL HFA;VENTOLIN HFA) 108 (90 BASE) MCG/ACT inhaler Inhale 2 puffs into the lungs every 4 (four) hours as needed for wheezing or shortness of breath. 06/05/15  Yes Alexander J Karamalegos, DO  atorvastatin (LIPITOR) 40 MG tablet Take 1 tablet (40 mg total) by mouth daily. 05/04/15  Yes Alexander Devin Going, DO  bisoprolol (ZEBETA) 5 MG tablet Take 2.5 mg by mouth daily.   Yes Historical Provider, MD  budesonide-formoterol (SYMBICORT) 80-4.5 MCG/ACT inhaler Inhale 2 puffs into the lungs 2 (two) times daily as needed (for shortness of breath).  04/06/14  Yes Deneise Lever, MD   calcium-vitamin D (OSCAL WITH D) 500-200 MG-UNIT per tablet Take 1 tablet by mouth daily with breakfast.    Yes Historical Provider, MD  cephALEXin (KEFLEX) 500 MG capsule Take 1 capsule (500 mg total) by mouth every 6 (six) hours. 12/17/15 12/25/15 Yes Asiyah Cletis Media, MD  etonogestrel (NEXPLANON) 68 MG IMPL implant 1 each by Subdermal route once.   Yes Historical Provider, MD  fluocinonide (LIDEX) 0.05 % external solution 1 APPLICATION APPLY ON THE SKIN AS DIRECTED APPLY TO AFFECTED AREAS DAILY ON SCALP  AS NEEDED FOR ITCHING. 09/28/15  Yes Historical Provider, MD  furosemide (LASIX) 20 MG tablet Take 20 mg by mouth daily. 11/19/15  Yes Historical Provider, MD  Insulin Glargine (LANTUS SOLOSTAR) 100 UNIT/ML Solostar Pen Inject 35 Units into the skin daily at 10 pm. Patient taking differently: Inject 35 Units into the skin daily as needed (low blood sugar).  10/31/15  Yes Alexander Devin Going, DO  metFORMIN (GLUCOPHAGE) 1000 MG tablet TAKE 1 TABLET BY MOUTH TWICE A DAY WITH A MEAL 05/18/15  Yes [name], DO  mycophenolate (CELLCEPT) 500 MG tablet Take 500 mg by mouth 2 (two) times daily.   Yes Historical Provider, MD  Nutritional Supplements (GLUCERNA ADVANCE SHAKE) LIQD Take supplement daily in place of 1 meal. Patient taking differently: Take 1 Can by mouth daily as needed (meal replacement). Take supplement daily in place of 1 meal. 11/03/14  Yes [name], DO  oxyCODONE-acetaminophen (PERCOCET) 5-325 MG tablet Take 1 tablet by mouth every 6 (six) hours as needed for severe pain. 08/23/15  Yes Alexander Devin Going, DO  potassium chloride SA (KLOR-CON M20) 20 MEQ tablet Take 1 tablet (20 mEq total) by mouth daily. 07/24/15  Yes Alexander Devin Going, DO  Blood Glucose Monitoring Suppl (ONE TOUCH ULTRA 2) W/DEVICE KIT Check blood sugar daily before breakfast 10/25/14   Leone Brand, MD  glucose blood (ONE TOUCH ULTRA TEST) test strip Check blood sugar daily before  breakfast 10/25/14   Leone Brand, MD  Insulin Pen Needle (INSUPEN PEN NEEDLES) 32G X 4 MM MISC Use with insulin pen 07/15/15   Smiley Houseman, MD  predniSONE (DELTASONE) 10 MG tablet Take 3 tablets for 5 days, then 2 tablets for 5 days, then return 1 tablet daily Patient not taking: Reported on 12/18/2015 12/17/15   Asiyah Cletis Media, MD   BP 101/64 mmHg  Pulse 70  Resp 17  SpO2 88% Physical Exam  Constitutional: She is oriented to person, place, and time. Nasal cannula in place.  Chronically ill appearing  HENT:  Right Ear: External ear normal.  Left Ear: External ear normal.  Nose: Nose normal.  Mouth/Throat: Oropharynx is clear and moist. No oropharyngeal exudate.  Eyes: Conjunctivae and EOM are normal. Pupils are equal, round, and reactive to light.  Neck: Normal range of motion. Neck supple.  Cardiovascular: Normal rate, regular rhythm, normal heart sounds and intact distal pulses.   Pulmonary/Chest: Effort normal. No respiratory distress. She has no wheezes. She exhibits no tenderness.  Bilateral coarse lung sounds and rhonchi in bilateral lung bases.   Abdominal: Soft. Bowel sounds are normal. She exhibits no distension. There is generalized tenderness. There is no rebound, no guarding and no CVA tenderness.  Musculoskeletal: She exhibits no edema.  Neurological: She is alert and oriented to person, place, and time. No cranial nerve deficit.  Skin: Skin is warm and dry. There is pallor.  No LE edema  Psychiatric: She has a normal mood and affect.  Nursing note and vitals reviewed.   Filed Vitals:   12/18/15 1234 12/18/15 1332 12/18/15 1543  BP: 101/64 135/83 100/64  Pulse: 70 74 73  Resp: 17 12 60  SpO2: 88% 73% 94%  Error or technical difficulty with vitals recordings. I have re-assessed pt multiple times and on 4L of O2 via [place] she has maintained SpO2 90-92% with good waveform. Her RR has remained stable between 15-20.   ED Course  Procedures (including critical  care time) Labs Review Labs  Reviewed  CBC WITH DIFFERENTIAL/PLATELET - Abnormal; Notable for the following:    WBC 12.9 (*)    RBC 3.85 (*)    Hemoglobin 9.7 (*)    HCT 32.1 (*)    MCH 25.2 (*)    RDW 18.5 (*)    Neutro Abs 10.9 (*)    All other components within normal limits  BASIC METABOLIC PANEL - Abnormal; Notable for the following:    Potassium 3.4 (*)    Calcium 7.6 (*)    All other components within normal limits  BRAIN NATRIURETIC PEPTIDE - Abnormal; Notable for the following:    B Natriuretic Peptide 345.4 (*)    All other components within normal limits  URINE CULTURE  URINALYSIS, ROUTINE W REFLEX MICROSCOPIC (NOT AT [facility])  I-STAT BETA HCG BLOOD, ED ([facility], [facility], [facility] ONLY)  I-STAT CG4 LACTIC ACID, ED    Imaging Review Dg Chest 2 View  12/18/2015  CLINICAL DATA:  Cough, lupus, history of asthma EXAM: CHEST  2 VIEW COMPARISON:  CT chest 12/10/2015 FINDINGS: There are bilateral interstitial and alveolar airspace opacities. There are bilateral small pleural effusions. There is no pneumothorax. The heart and mediastinal contours are unremarkable. There is a cardiac pacemaker. The osseous structures are unremarkable. IMPRESSION: Bilateral interstitial and alveolar airspace opacities which may reflect pulmonary edema versus multi lobar pneumonia. Electronically Signed   By: Kathreen Devoid   On: 12/18/2015 14:07   I have personally reviewed and evaluated these images and lab results as part of my medical decision-making.   EKG Interpretation   Date/Time:  Tuesday December 18 2015 13:31:38 EST Ventricular Rate:  77 PR Interval:  64 QRS Duration: 112 QT Interval:  420 QTC Calculation: 475 R Axis:   29 Text Interpretation:  Atrial-sensed ventricular-paced complexes No further  rhythm analysis attempted due to paced rhythm Incomplete left bundle  branch block Low voltage, extremity leads ST elevation, consider lateral  injury Confirmed by COOK  MD, BRIAN ([id number]) on 12/18/2015  1:35:39 PM      MDM   Final diagnoses:  HCAP (healthcare-associated pneumonia)  Acute on chronic congestive heart failure, unspecified congestive heart failure type (HCC)  Generalized weakness    Pt is an 35 y.o. female with complex medical history, discharged yesterday after admission for sepsis 2/2 pyelonephritis. She returns today for evaluation of generalized weakness, which she reports is worse in her legs. On my exam it is unclear if she is truly weak in her legs versus effort issue. However, she does report cough increased from baseline. She has a white count of 12.9 today. BNP 345.5. CXR shows bilateral interstitial and alveolar opacities that could reflect pulmonary edema vs multilobar pneumonia. I spoke to radiologist Dr. Posey Pronto to get a better idea if he thinks this is more CHF or PNA. He feels it is difficult to say, would lean more towards pulmonary edema if pt does not have a white count or symptoms. At this point clinically it is difficult to say if CHF exacerbation or HCAP. Will initiate tx for both. IV lasix 38m given. Vanc and Maxipime ordered for HCAP. I will call family practice for admission.   Family practice to admit. Will transfer to [facility].  SAnne Ng PA-C 12/19/15 [id number] BNat Christen MD 12/19/15 [phone]

## 2015-12-18 NOTE — ED Notes (Addendum)
Report given to [facility] at [facility]. [facility] in route

## 2015-12-19 ENCOUNTER — Encounter (HOSPITAL_COMMUNITY): Payer: Self-pay | Admitting: Family Medicine

## 2015-12-19 ENCOUNTER — Inpatient Hospital Stay (HOSPITAL_COMMUNITY): Payer: Medicare Other

## 2015-12-19 ENCOUNTER — Ambulatory Visit: Payer: Medicare Other | Admitting: Internal Medicine

## 2015-12-19 DIAGNOSIS — T380X5A Adverse effect of glucocorticoids and synthetic analogues, initial encounter: Secondary | ICD-10-CM

## 2015-12-19 DIAGNOSIS — R0682 Tachypnea, not elsewhere classified: Secondary | ICD-10-CM | POA: Insufficient documentation

## 2015-12-19 DIAGNOSIS — I509 Heart failure, unspecified: Secondary | ICD-10-CM | POA: Insufficient documentation

## 2015-12-19 DIAGNOSIS — G722 Myopathy due to other toxic agents: Secondary | ICD-10-CM

## 2015-12-19 DIAGNOSIS — G720 Drug-induced myopathy: Secondary | ICD-10-CM | POA: Insufficient documentation

## 2015-12-19 DIAGNOSIS — D62 Acute posthemorrhagic anemia: Secondary | ICD-10-CM

## 2015-12-19 DIAGNOSIS — R0602 Shortness of breath: Secondary | ICD-10-CM

## 2015-12-19 DIAGNOSIS — I4430 Unspecified atrioventricular block: Secondary | ICD-10-CM | POA: Insufficient documentation

## 2015-12-19 DIAGNOSIS — I9589 Other hypotension: Secondary | ICD-10-CM

## 2015-12-19 DIAGNOSIS — J9600 Acute respiratory failure, unspecified whether with hypoxia or hypercapnia: Secondary | ICD-10-CM

## 2015-12-19 DIAGNOSIS — J189 Pneumonia, unspecified organism: Principal | ICD-10-CM

## 2015-12-19 DIAGNOSIS — R531 Weakness: Secondary | ICD-10-CM | POA: Insufficient documentation

## 2015-12-19 DIAGNOSIS — I5023 Acute on chronic systolic (congestive) heart failure: Secondary | ICD-10-CM

## 2015-12-19 DIAGNOSIS — G6281 Critical illness polyneuropathy: Secondary | ICD-10-CM

## 2015-12-19 DIAGNOSIS — Z9981 Dependence on supplemental oxygen: Secondary | ICD-10-CM | POA: Insufficient documentation

## 2015-12-19 DIAGNOSIS — R29898 Other symptoms and signs involving the musculoskeletal system: Secondary | ICD-10-CM

## 2015-12-19 LAB — BASIC METABOLIC PANEL
ANION GAP: 11 (ref 5–15)
BUN: 12 mg/dL (ref 6–20)
CO2: 26 mmol/L (ref 22–32)
Calcium: 7.6 mg/dL — ABNORMAL LOW (ref 8.9–10.3)
Chloride: 100 mmol/L — ABNORMAL LOW (ref 101–111)
Creatinine, Ser: 0.71 mg/dL (ref 0.44–1.00)
GFR calc Af Amer: >60 mL/min (ref >60)
Glucose, Bld: 237 mg/dL — ABNORMAL HIGH (ref 65–99)
POTASSIUM: 4 mmol/L (ref 3.5–5.1)
SODIUM: 137 mmol/L (ref 135–145)

## 2015-12-19 LAB — TROPONIN I
TROPONIN I: 0.03 ng/mL (ref <0.031)
Troponin I: 0.05 ng/mL — ABNORMAL HIGH (ref <0.031)

## 2015-12-19 LAB — GLUCOSE, CAPILLARY
GLUCOSE-CAPILLARY: 222 mg/dL — AB (ref 65–99)
GLUCOSE-CAPILLARY: 261 mg/dL — AB (ref 65–99)
Glucose-Capillary: 179 mg/dL — ABNORMAL HIGH (ref 65–99)
Glucose-Capillary: 181 mg/dL — ABNORMAL HIGH (ref 65–99)

## 2015-12-19 LAB — LACTIC ACID, PLASMA
Lactic Acid, Venous: 0.8 mmol/L (ref 0.5–2.0)
Lactic Acid, Venous: 1.2 mmol/L (ref 0.5–2.0)

## 2015-12-19 LAB — CBC
HEMATOCRIT: 29.2 % — AB (ref 36.0–46.0)
HEMOGLOBIN: 8.9 g/dL — AB (ref 12.0–15.0)
MCH: 24.7 pg — ABNORMAL LOW (ref 26.0–34.0)
MCHC: 30.5 g/dL (ref 30.0–36.0)
MCV: 80.9 fL (ref 78.0–100.0)
Platelets: 265 10*3/uL (ref 150–400)
RBC: 3.61 MIL/uL — ABNORMAL LOW (ref 3.87–5.11)
RDW: 18.4 % — ABNORMAL HIGH (ref 11.5–15.5)
WBC: 8.5 10*3/uL (ref 4.0–10.5)

## 2015-12-19 LAB — TSH: TSH: 5.581 u[IU]/mL — AB (ref 0.350–4.500)

## 2015-12-19 LAB — MRSA PCR SCREENING: MRSA by PCR: NEGATIVE

## 2015-12-19 LAB — VITAMIN B12: VITAMIN B 12: 184 pg/mL (ref 180–914)

## 2015-12-19 LAB — SEDIMENTATION RATE: SED RATE: 109 mm/h — AB (ref 0–22)

## 2015-12-19 MED ORDER — FUROSEMIDE 20 MG PO TABS
20.0000 mg | ORAL_TABLET | Freq: Every day | ORAL | Status: DC
  Administered 2015-12-19 – 2015-12-20 (×2): 20 mg via ORAL
  Filled 2015-12-19 (×2): qty 1

## 2015-12-19 MED ORDER — INSULIN ASPART 100 UNIT/ML ~~LOC~~ SOLN
0.0000 [IU] | Freq: Three times a day (TID) | SUBCUTANEOUS | Status: DC
  Administered 2015-12-19: 3 [IU] via SUBCUTANEOUS
  Administered 2015-12-20: 1 [IU] via SUBCUTANEOUS

## 2015-12-19 MED ORDER — CEPHALEXIN 500 MG PO CAPS
500.0000 mg | ORAL_CAPSULE | Freq: Four times a day (QID) | ORAL | Status: DC
  Administered 2015-12-19 – 2015-12-20 (×6): 500 mg via ORAL
  Filled 2015-12-19 (×6): qty 1

## 2015-12-19 MED ORDER — PREDNISONE 20 MG PO TABS
30.0000 mg | ORAL_TABLET | Freq: Two times a day (BID) | ORAL | Status: DC
  Administered 2015-12-20: 30 mg via ORAL
  Filled 2015-12-19: qty 1

## 2015-12-19 MED ORDER — INSULIN ASPART 100 UNIT/ML ~~LOC~~ SOLN
0.0000 [IU] | Freq: Every day | SUBCUTANEOUS | Status: DC
  Administered 2015-12-19: 3 [IU] via SUBCUTANEOUS

## 2015-12-19 NOTE — Progress Notes (Signed)
Rehab admissions - I am following for potential inpatient rehab admission.  Please see Dr. Eliane Decree rehab consult done today.  I will follow up with patient in the am.  Call me for questions.  #[phone]

## 2015-12-19 NOTE — Consult Note (Signed)
Name: Makayla Vasquez MRN: [id number] DOB: 08-09-1981    ADMISSION DATE:  12/18/2015 CONSULTATION DATE:  3/1  REFERRING MD :  FPTS   CHIEF COMPLAINT:  ILD, HCAP   BRIEF PATIENT DESCRIPTION: 35yo female former smoker with hx systolic heart failure, complete heart block now s/p pacemaker, lupus on chronic prednisone and immunosuppressive rx and chronic hypoxic respiratory failure on 4L home O2 on the basis of ILD thought r/t lupus pneumonitis (has never had bx).  She was recently admitted (just d/c 2/25) with pyelonephritis.  Pt has not taken any of her home medications since d/c including keflex for pyelo.  She returned 2/28 with c/o BLE weakness and SOB.  She was admitted by FPTS with HCAP and PCCM consulted for assist with ILD, dyspnea.   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  She was recently admitted (just d/c 2/25) with pyelonephritis.  Pt has not taken any of her home medications since d/c including keflex for pyelo.  She returned 2/28 with c/o BLE weakness and SOB.  She was admitted by FPTS with HCAP and PCCM consulted for assist with ILD, dyspnea.   Denies chest pain, hemoptysis.  She is feeling some better since being in the hospital.    PAST MEDICAL HISTORY :   has a past medical history of Cardiac pacemaker; Congenital complete AV block; Obesity; GERD (gastroesophageal reflux disease); Asymptomatic LV dysfunction; Seizures ([place]); Anxiety; Bipolar affective disorder ([place]); Depression; Carpal tunnel syndrome of right wrist; Asthma; Arthritis; Hypertension; Pneumonitis; Sinus tachycardia ([place]); Diabetes mellitus without complication ([place]); Presence of permanent cardiac pacemaker; CHF (congestive heart failure)  ([place]); Lupus ([place]); and Lupus (systemic lupus erythematosus) ([place]).  has past surgical history that includes Throat surgery (1994); Cesarean section; Insert / replace / remove pacemaker; Cholecystectomy; IUD removal (11/03/2011); Cyst excision (12/10/2012); Thyroglossal duct cyst (N/A, 12/10/2012); Nasal fracture surgery; Carpal tunnel with cubital tunnel (Right, 07/26/2013); Video bronchoscopy (Bilateral, 06/19/2014); bi-ventricular pacemaker upgrade (N/A, 03/08/2012); Atrial tach ablation (N/A, 08/14/2014); and right heart catheterization (N/A, 10/26/2014). Prior to Admission medications   Medication Sig Start Date End Date Taking? Authorizing Provider  albuterol (PROVENTIL HFA;VENTOLIN HFA) 108 (90 BASE) MCG/ACT inhaler Inhale 2 puffs into the lungs every 4 (four) hours as needed for wheezing or shortness of breath. 06/05/15  Yes Alexander J Karamalegos, DO  atorvastatin (LIPITOR) 40 MG tablet Take 1 tablet (40 mg total) by mouth daily. 05/04/15  Yes Alexander Devin Going, DO  bisoprolol (ZEBETA) 5 MG tablet Take 2.5 mg by mouth daily.   Yes Historical Provider, MD  budesonide-formoterol (SYMBICORT) 80-4.5 MCG/ACT inhaler Inhale 2 puffs into the lungs 2 (two) times daily as needed (for shortness of breath).  04/06/14  Yes Deneise Lever, MD  calcium-vitamin D (OSCAL WITH D) 500-200 MG-UNIT per tablet Take 1 tablet by mouth daily with breakfast.    Yes Historical Provider, MD  cephALEXin (KEFLEX) 500 MG capsule Take 1 capsule (500 mg total) by mouth every 6 (six) hours. 12/17/15 12/25/15 Yes Asiyah Cletis Media, MD  etonogestrel (NEXPLANON) 68 MG IMPL implant 1 each by Subdermal route once.   Yes Historical Provider, MD  fluocinonide (LIDEX) 0.05 % external solution 1 APPLICATION APPLY ON THE SKIN AS DIRECTED APPLY  TO AFFECTED AREAS DAILY ON SCALP AS NEEDED FOR ITCHING. 09/28/15  Yes Historical Provider, MD  furosemide (LASIX) 20 MG tablet Take 20 mg by mouth daily. 11/19/15  Yes Historical Provider, MD  Insulin  Glargine (LANTUS SOLOSTAR) 100 UNIT/ML Solostar Pen Inject 35 Units into the skin daily at 10 pm. Patient taking differently: Inject 35 Units into the skin daily as needed (low blood sugar).  10/31/15  Yes Alexander Devin Going, DO  metFORMIN (GLUCOPHAGE) 1000 MG tablet TAKE 1 TABLET BY MOUTH TWICE A DAY WITH A MEAL 05/18/15  Yes [name], DO  mycophenolate (CELLCEPT) 500 MG tablet Take 500 mg by mouth 2 (two) times daily.   Yes Historical Provider, MD  Nutritional Supplements (GLUCERNA ADVANCE SHAKE) LIQD Take supplement daily in place of 1 meal. Patient taking differently: Take 1 Can by mouth daily as needed (meal replacement). Take supplement daily in place of 1 meal. 11/03/14  Yes [name], DO  oxyCODONE-acetaminophen (PERCOCET) 5-325 MG tablet Take 1 tablet by mouth every 6 (six) hours as needed for severe pain. 08/23/15  Yes Alexander Devin Going, DO  potassium chloride SA (KLOR-CON M20) 20 MEQ tablet Take 1 tablet (20 mEq total) by mouth daily. 07/24/15  Yes Alexander Devin Going, DO  Blood Glucose Monitoring Suppl (ONE TOUCH ULTRA 2) W/DEVICE KIT Check blood sugar daily before breakfast 10/25/14   Leone Brand, MD  glucose blood (ONE TOUCH ULTRA TEST) test strip Check blood sugar daily before breakfast 10/25/14   Leone Brand, MD  Insulin Pen Needle (INSUPEN PEN NEEDLES) 32G X 4 MM MISC Use with insulin pen 07/15/15   Smiley Houseman, MD  predniSONE (DELTASONE) 10 MG tablet Take 3 tablets for 5 days, then 2 tablets for 5 days, then return 1 tablet daily Patient not taking: Reported on 12/18/2015 12/17/15   Asiyah Cletis Media, MD   Allergies  Allergen Reactions  . Sertraline Hcl Hives  . Tape Other (See Comments)    Burns skin    FAMILY HISTORY:  family history includes Heart disease in her father and mother; Heart disease (age of onset: 60) in her sister; Hypertension in her mother; Lupus in her mother. SOCIAL HISTORY:  reports that she quit smoking  about 19 years ago. Her smoking use included Cigarettes. She has a .125 pack-year smoking history. She has never used smokeless tobacco. She reports that she does not drink alcohol or use illicit drugs.  REVIEW OF SYSTEMS:   As per HPI - All other systems reviewed and were neg.    SUBJECTIVE:   VITAL SIGNS: Temp:  [97.4 F (36.3 C)-100.2 F (37.9 C)] 98 F (36.7 C) (03/01 1100) Pulse Rate:  [67-141] 83 (03/01 1424) Resp:  [0-60] 27 (03/01 1424) BP: (82-137)/(44-105) 91/58 mmHg (03/01 0700) SpO2:  [88 %-99 %] 95 % (03/01 1424) Weight:  [83 kg (182 lb 15.7 oz)] 83 kg (182 lb 15.7 oz) (03/01 0800)  PHYSICAL EXAMINATION: General:  Pleasant chronically ill appearing female, NAD sitting OOB in chair  Neuro:  Awake, alert, appropriate HEENT:  Mm moist, no JVD  Cardiovascular:  s1s2 rrr Lungs:  resps even non labored on [place], coarse, good bilat air movement  Abdomen:  Round, soft, +bs  Musculoskeletal:  Warm and dry   Recent Labs Lab 12/17/15 0916 12/18/15 1445 12/19/15 0702  NA 143 141 137  K 3.7 3.4* 4.0  CL 110 106 100*  CO2 _0 BUN _1 CREATININE 0.60  0.57 0.71  GLUCOSE 112* 97 237*    Recent Labs Lab 12/17/15 0916 12/18/15 1445 12/19/15 0702  HGB 9.6* 9.7* 8.9*  HCT 33.5* 32.1* 29.2*  WBC 9.8 12.9* 8.5  PLT 397 352 265   Dg Chest 2 View  12/19/2015  CLINICAL DATA:  Recent admit for UTI EXAM: CHEST  2 VIEW COMPARISON:  12/18/2015 FINDINGS: Cardiac shadow is mildly enlarged. A pacing device is again seen. Patchy infiltrates are again noted throughout both lungs although some mild improved aeration is seen. The infiltrative changes are noted worst in the right lung base. No acute bony abnormality is noted. IMPRESSION: Slight improvement in bilateral infiltrates Electronically Signed   By: Inez Catalina M.D.   On: 12/19/2015 08:02   Dg Chest 2 View  12/18/2015  CLINICAL DATA:  Cough, lupus, history of asthma EXAM: CHEST  2 VIEW COMPARISON:  CT chest 12/10/2015  FINDINGS: There are bilateral interstitial and alveolar airspace opacities. There are bilateral small pleural effusions. There is no pneumothorax. The heart and mediastinal contours are unremarkable. There is a cardiac pacemaker. The osseous structures are unremarkable. IMPRESSION: Bilateral interstitial and alveolar airspace opacities which may reflect pulmonary edema versus multi lobar pneumonia. Electronically Signed   By: Kathreen Devoid   On: 12/18/2015 14:07   Dg Lumbar Spine Complete  12/18/2015  CLINICAL DATA:  Lower extremity weakness for 2 months. EXAM: LUMBAR SPINE - COMPLETE 4+ VIEW COMPARISON:  CT, 12/14/2015 FINDINGS: L5 is a transitional lumbosacral vertebrae. There is no fracture spondylolisthesis. There are no bone lesions. No degenerative changes. Mild straightening of the normal lumbar lordosis unchanged from the CT. Soft tissues are unremarkable. IMPRESSION: 1. No fracture or acute finding. 2. Mild straightening of the normal lumbar lordosis and transitional lumbosacral vertebrae. Otherwise unremarkable. Electronically Signed   By: Lajean Manes M.D.   On: 12/18/2015 23:44    ASSESSMENT / PLAN:  Acute on chronic hypoxic respiratory failure - multifactorial in setting ILD, lupus pneumonitis, heart failure +/- HCAP (doubt).  ILD - never bx.   SLE   PLAN -   -Continue steroids - currently at 72m/day, maintenance dose is 176mday -Supplemental O2 as needed - maintenance 4L -Cont abx for UTI/pyelo but agree with d/c broad spectrum abx - doubt HCAP -Gentle diuresis as tol  -check ESR, c3, c4, ch50, ana, double stranded DNA  -??should we consider bronch or VATS bx while she is inpt -- unclear if this would be helpful at this point or if cardiology would clear her (recently was not cleared for dental procedure).  Would prefer not to do in the acute setting when she is having flare +/- infection. We should continue treatment (steroids, abx for UTI), check above labs, get her back home and  near baseline, have her see rheum again and then determine when/if she needs open lung bx.   -outpt pulm f/u    KaNickolas MadridNP 12/19/2015  3:01 PM Pager: [phone] or [phone] STAFF NOTE: I, DaMerrie RoofMD FACP have personally reviewed patient's available data, including medical history, events of note, physical examination and test results as part of my evaluation. I have discussed with resident/NP and other care providers such as pharmacist, RN and RRT. 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 Titus Mould, MD, [place] Pgr: [facility] 12/19/2015 5:16 PM

## 2015-12-19 NOTE — Progress Notes (Signed)
Family Medicine Teaching Service Daily Progress Note Intern Pager: [phone]  Patient name: Makayla Vasquez Medical record number: [id number] Date of birth: 1981/10/05 Age: 35 y.o. Gender: female  Primary Care Provider: Saralyn Pilar, DO Consultants: None  Code Status: FULL   Pt Overview and Major Events to Date:  2/8: Patient admitted to FPTS   Assessment and Plan: Makayla Vasquez is a 35 y.o. female presenting with LE weakness and pain, urinary incontinence, and shortness of breath. PMH is significant for HFrEF (EF 40-45%), congenital AV block with pace maker, Lupus on chronic prednisone and immunosuppressive therapy.   LE Weakness/Pain: unclear in etiology. Possibly acute worsening of chronic issue. Notes of urinary incontinence, but no bowel incontinence. No sensory deficits. No back tenderness or back pain.  - admit to teaching service, step down unit, attending Dr. Denny Levy  - x-ray lumbar spine unremarkable for fracture or an acute finding - Percocet PRN pain  - PT/OT eval >> recommending CIR placement  - TSH elevated to 5.581, T3/T4 ordered  -B12 on low side of normal at 184   SOB:  Concern for HCAP at admission given bilateral interstitial and alveolar airspace opacities on CXR which may reflect pulmonary edema vs. Multilobar PNA.Repeat CRX with mild improvement in bilateral infiltrates. Mildly elevated WBC at 12.9 but has been on Prednisone, repeat WBC WNL at 8.5. No fever. Lung exam is unremarkable. 92% on home 4L. S/p Vanc and Cefepime at [facility] ED. Lactic acid normal at 1.2 Concern for possible worsening chronic interstitial lung disease vs. New primary problem.  - blood cultures pending (obtained after abx)  - continuous pulse ox - supplemental O2 to keep O2 >92% (on 4L chronically) - d/c Vancomycin and Cefepime given low suspicion for HCAP  -will consult pulm for recs   HFrEF (EF40-45%): BNP elevated at 345.4 (from 2/24 at 45.7) in ED. S/p IV Lasix 40mg  in ED. CXR  with signs of possible pulmonary edema although lung exam is unremarkable for crackles. No clinical signs of fluid overload.  - will restart home lasix   - Daily weights and strict I/O   Chest Pain: somewhat reassuring as there seems to be a msk component as pt has tenderness to palpation. EKG in ED poor tracing.  - STAT EKG, repeat in AM - troponin 0.05>> 0.05, continue to trend  - telemetry   Hypotension. Patient with persistently low BPs in 80s/40s. This is near her baseline. - Continue to monitor - Consider stress dose steroids if decreasing below her typical range  T2DM: Home meds: Metformin 1000mg  BID, Lantus 35 units daily. CBG 73 on admission.  - heart healthy carb modified diet - consider starting sensitive SSI when CBGs increase  Interstitial Lung Disease, on chronic prednisone and immunosuppressive therapy: On 4L O2 at home. Followed by Pulmonology and was supposed to have an appointment on 3/1.   - supplemental O2, to keep O2 > 92% - continue home Cellcept  - Patient was discharged on Prednisone taper: 30mg  for 5 days, then 20mg  for 5 days, then back to home dose of 10mg  daily. Will continue the taper with Prednisone 30mg  day 2/5 - cont Dulera BID PRN and Albuterol PRN   Congenital AVN Block s/p atrial pacer (placed at age 53yo):  - on telemetry   Recent Pyelonephritis: discharged 2/27, patient did not take medication at home (was discharged with 9 days of Keflex). UA with trace hgb otherwise unremarkable.  - start Keflex  - urine culture obtained in  ED  FEN/GI: heart healthy carb modified Prophylaxis: Lovenox Sub Q  Disposition: ? Rehab Placement   Subjective:  Reports feeling weak. Breathing has improved per patient. Denies SOB.   Objective: Temp:  [97.4 F (36.3 C)-100.2 F (37.9 C)] 97.4 F (36.3 C) (03/01 0400) Pulse Rate:  [67-141] 67 (03/01 0400) Resp:  [0-60] 23 (03/01 0400) BP: (82-137)/(44-105) 98/76 mmHg (03/01 0400) SpO2:  [73 %-99  %] 99 % (03/01 0400) Physical Exam: General: NAD, awake and alert  Cardiovascular: RRR. No murmurs appreciated.  Respiratory: CTAB. No crackles appreciated.  Extremities: No LE edema.  Neuro: Strength 3/5 in LE bilaterally. Grossly normal sensation.   Laboratory:  Recent Labs Lab 12/16/15 0616 12/17/15 0916 12/18/15 1445  WBC 5.4 9.8 12.9*  HGB 8.8* 9.6* 9.7*  HCT 29.9* 33.5* 32.1*  PLT 331 397 352    Recent Labs Lab 12/14/15 0119 12/15/15 1636 12/17/15 0916 12/18/15 1445  NA 134* 140 143 141  K 4.2 4.0 3.7 3.4*  CL 101 109 110 106  CO2 23 19* 24 27  BUN 11 8 8 9   CREATININE 0.56 0.59 0.60 0.57  CALCIUM 7.8* 7.7* 8.6* 7.6*  PROT 6.5 5.9*  --   --   BILITOT 1.7* 0.5  --   --   ALKPHOS 67 55  --   --   ALT 25 22  --   --   AST 59* 42*  --   --   GLUCOSE 169* 218* 112* 97   BNP 345.4 Trop 0.05 >> 0.05   Imaging/Diagnostic Tests: Dg Chest 2 View  12/18/2015 CLINICAL DATA: Cough, lupus, history of asthma EXAM: CHEST 2 VIEW COMPARISON: CT chest 12/10/2015 FINDINGS: There are bilateral interstitial and alveolar airspace opacities. There are bilateral small pleural effusions. There is no pneumothorax. The heart and mediastinal contours are unremarkable. There is a cardiac pacemaker. The osseous structures are unremarkable. IMPRESSION: Bilateral interstitial and alveolar airspace opacities which may reflect pulmonary edema versus multi lobar pneumonia. Electronically Signed By: 12/12/2015 On: 12/18/2015 14:07   Dg Lumbar Spine Complete  12/18/2015 CLINICAL DATA: Lower extremity weakness for 2 months. EXAM: LUMBAR SPINE - COMPLETE 4+ VIEW COMPARISON: CT, 12/14/2015 FINDINGS: L5 is a transitional lumbosacral vertebrae. There is no fracture spondylolisthesis. There are no bone lesions. No degenerative changes. Mild straightening of the normal lumbar lordosis unchanged from the CT. Soft tissues are unremarkable. IMPRESSION: 1. No fracture or acute finding. 2. Mild  straightening of the normal lumbar lordosis and transitional lumbosacral vertebrae. Otherwise unremarkable. Electronically Signed By: 12/16/2015 M.D. On: 12/18/2015 23:44   12/20/2015, DO 12/19/2015, 7:12 AM PGY-1, [facility] Family Medicine FPTS Intern pager: [phone], text pages welcome

## 2015-12-19 NOTE — Progress Notes (Signed)
Patient transferred to [phone] via bed with oxygen.  Pt belongings were packed and transported with her.  VSS.

## 2015-12-19 NOTE — Consult Note (Signed)
Physical Medicine and Rehabilitation Consult Reason for Consult: LE weakness  HPI: Makayla Vasquez is a 35 y.o. right handed female with history of diastolic congestive heart failure, congenital AV block with pacemaker, lupus with chronic prednisone and immunosuppressive therapy with chronic oxygen and chronic hypotension. By report patient was independent in her home environment up until 3 weeks ago needing physical assistance from her boyfriend for all transfers and ADLs due to progressive weakness lower extremities. Patient with recent admission for pyelonephritis as well as recent viral gastroenteritis. Presented 12/18/2015 with generalized weakness and shortness of breath. Chest x-ray showed pulmonary edema versus multilobar pneumonia. WBC 12,900. Oxygen saturation is 92% on 4 L oxygen. Placed on broad-spectrum antibiotics. Lumbar spine films showed no fracture or acute finding. Recent echocardiogram with ejection fraction 45%. Troponin mildly elevated 0.05 felt to be related to demand ischemia. Subcutaneous Lovenox for DVT prophylaxis. Physical therapy evaluation completed 12/19/2015 with recommendations of physical medicine rehabilitation consult.   Review of Systems  Constitutional: Negative for fever and chills.  Respiratory: Negative for cough.        Shortness of breath with exertion  Cardiovascular: Positive for palpitations and leg swelling. Negative for chest pain.  Gastrointestinal: Positive for constipation. Negative for nausea and vomiting.       GERD  Genitourinary: Positive for urgency and frequency.  Musculoskeletal: Positive for myalgias and joint pain.  Skin: Negative for rash.  Neurological: Positive for weakness and headaches.  Psychiatric/Behavioral: Positive for depression.  All other systems reviewed and are negative.  Past Medical History  Diagnosis Date  . Cardiac pacemaker     a. Since age 89 in 69. b. Upgrade to BiV in 2013.  . Congenital complete AV  block   . Obesity   . GERD (gastroesophageal reflux disease)   . Asymptomatic LV dysfunction     a. Echo in Dec 2011 with EF 35 to 40%. Felt to be due to paced rhythm. b. EF 25-30% in 07/2014.  . Seizures ([place])     as a child- from high fever  . Anxiety   . Bipolar affective disorder ([place])   . Depression     bipolar  . Carpal tunnel syndrome of right wrist   . Asthma     seasonal allergies   . Arthritis     rheumatoid arthritis- mild, no rheumatology care   . Hypertension   . Pneumonitis     a. a/w hypoxia - inflammatory - large workup 07/2014.  [name] Sinus tachycardia ([place])   . Diabetes mellitus without complication ([place])   . Presence of permanent cardiac pacemaker   . CHF (congestive heart failure) ([place])   . Lupus ([place])   . Lupus (systemic lupus erythematosus) ([place])    Past Surgical History  Procedure Laterality Date  . Throat surgery  1994    s/p laser treatment  . Cesarean section    . Insert / replace / remove pacemaker      2001  . Cholecystectomy    . Iud removal  11/03/2011    Procedure: INTRAUTERINE DEVICE (IUD) REMOVAL;  Surgeon: [name], MD;  Location: [facility];  Service: Gynecology;  Laterality: N/A;  . Cyst excision  12/10/2012    THYROID  . Thyroglossal duct cyst N/A 12/10/2012    Procedure: REVISION OF THYROGLOSSAL DUCT CYST EXCISION;  Surgeon: [name], MD;  Location: [place];  Service: ENT;  Laterality: N/A;  Revision of Thyroglossal Duct Cyst Excision  . Nasal  fracture surgery      /w plate   . Carpal tunnel with cubital tunnel Right 07/26/2013    Procedure: RIGHT LIMITED OPEN CARPAL TUNNEL RELEASE ,  RIGHT CUBITAL TUNNEL RELEASE, INSITU VERSES ULNAR NERVE DECOMPRESSION AND ANTERIOR TRANSPOSITION;  Surgeon: [name], MD;  Location: [place];  Service: Orthopedics;  Laterality: Right;  . Video bronchoscopy Bilateral 06/19/2014    Procedure: VIDEO BRONCHOSCOPY WITHOUT FLUORO;  Surgeon: Brand Males, MD;  Location: [place];  Service: Cardiopulmonary;   Laterality: Bilateral;  . Bi-ventricular pacemaker upgrade N/A 03/08/2012    Procedure: BI-VENTRICULAR PACEMAKER UPGRADE;  Surgeon: [name], MD;  Location: [facility] CATH LAB;  Service: Cardiovascular;  Laterality: N/A;  . Atrial tach ablation N/A 08/14/2014    Procedure: ATRIAL TACH ABLATION;  Surgeon: [name], MD;  Location: [facility] CATH LAB;  Service: Cardiovascular;  Laterality: N/A;  . Right heart catheterization N/A 10/26/2014    Procedure: RIGHT HEART CATH;  Surgeon: [name], MD;  Location: [facility] CATH LAB;  Service: Cardiovascular;  Laterality: N/A;   Family History  Problem Relation Age of Onset  . Heart disease Mother     CHF (no details)  . Hypertension Mother   . Heart disease Father     Murmur  . Heart disease Sister 56     No details.  History of a pacemaker  . Lupus Mother    Social History:  reports that she quit smoking about 19 years ago. Her smoking use included Cigarettes. She has a .125 pack-year smoking history. She has never used smokeless tobacco. She reports that she does not drink alcohol or use illicit drugs. Allergies:  Allergies  Allergen Reactions  . Sertraline Hcl Hives  . Tape Other (See Comments)    Burns skin   Medications Prior to Admission  Medication Sig Dispense Refill  . albuterol (PROVENTIL HFA;VENTOLIN HFA) 108 (90 BASE) MCG/ACT inhaler Inhale 2 puffs into the lungs every 4 (four) hours as needed for wheezing or shortness of breath. 1 each 1  . atorvastatin (LIPITOR) 40 MG tablet Take 1 tablet (40 mg total) by mouth daily. 90 tablet 3  . bisoprolol (ZEBETA) 5 MG tablet Take 2.5 mg by mouth daily.    . budesonide-formoterol (SYMBICORT) 80-4.5 MCG/ACT inhaler Inhale 2 puffs into the lungs 2 (two) times daily as needed (for shortness of breath).     . calcium-vitamin D (OSCAL WITH D) 500-200 MG-UNIT per tablet Take 1 tablet by mouth daily with breakfast.     . cephALEXin (KEFLEX) 500 MG capsule Take 1 capsule (500 mg total) by mouth every  6 (six) hours. 36 capsule 0  . etonogestrel (NEXPLANON) 68 MG IMPL implant 1 each by Subdermal route once.    . fluocinonide (LIDEX) 0.05 % external solution 1 APPLICATION APPLY ON THE SKIN AS DIRECTED APPLY TO AFFECTED AREAS DAILY ON SCALP AS NEEDED FOR ITCHING.  5  . furosemide (LASIX) 20 MG tablet Take 20 mg by mouth daily.  5  . Insulin Glargine (LANTUS SOLOSTAR) 100 UNIT/ML Solostar Pen Inject 35 Units into the skin daily at 10 pm. (Patient taking differently: Inject 35 Units into the skin daily as needed (low blood sugar). ) 15 mL 5  . metFORMIN (GLUCOPHAGE) 1000 MG tablet TAKE 1 TABLET BY MOUTH TWICE A DAY WITH A MEAL 30 tablet 11  . mycophenolate (CELLCEPT) 500 MG tablet Take 500 mg by mouth 2 (two) times daily.    . Nutritional Supplements ([place]) LIQD  Take supplement daily in place of 1 meal. (Patient taking differently: Take 1 Can by mouth daily as needed (meal replacement). Take supplement daily in place of 1 meal.) 30 Bottle 2  . oxyCODONE-acetaminophen (PERCOCET) 5-325 MG tablet Take 1 tablet by mouth every 6 (six) hours as needed for severe pain. 60 tablet 0  . potassium chloride SA (KLOR-CON M20) 20 MEQ tablet Take 1 tablet (20 mEq total) by mouth daily. 30 tablet 2  . Blood Glucose Monitoring Suppl (ONE TOUCH ULTRA 2) W/DEVICE KIT Check blood sugar daily before breakfast 1 each 0  . glucose blood (ONE TOUCH ULTRA TEST) test strip Check blood sugar daily before breakfast 100 each 0  . Insulin Pen Needle (INSUPEN PEN NEEDLES) 32G X 4 MM MISC Use with insulin pen 30 each 0  . predniSONE (DELTASONE) 10 MG tablet Take 3 tablets for 5 days, then 2 tablets for 5 days, then return 1 tablet daily (Patient not taking: Reported on 12/18/2015) 90 tablet 0    Home: Home Living Family/patient expects to be discharged to:: Private residence Living Arrangements: Spouse/significant other Available Help at Discharge: Family, Available 24 hours/day (boyfriend available) Type of  Home: Apartment Home Access: Stairs to enter [employer] of Steps: 2 short steps Entrance Stairs-Rails: Left Home Layout: One level Bathroom Shower/Tub: Chiropodist: Standard Home Equipment: None  Functional History: Prior Function Level of Independence: Independent, Needs assistance Gait / Transfers Assistance Needed: pt states she needs assist to get out of tub at home, walks on her own typically but has needed boyfriend for all transfers the past 3 weeks ADL's / Homemaking Assistance Needed: pt has 5 y.o. son,boyfriend has been assisting with all ADLs the past few weeks, was independent before Communication / Swallowing Assistance Needed: no difficulties Comments: unless in a lupus flare pt is functionally independent with supplemental O2 Functional Status:  Mobility: Bed Mobility Overal bed mobility: Needs Assistance Bed Mobility: Supine to Sit Supine to sit: Mod assist General bed mobility comments: pt pivoted with assit from bed rails, assist needed to bring LEs to EOBa nd to bring trunk upright as pt fatigued quickly Transfers Overall transfer level: Needs assistance Equipment used: 2 person hand held assist Transfers: Sit to/from Stand, Stand Pivot Transfers Sit to Stand: Mod assist, +2 physical assistance Stand pivot transfers: Min assist, +2 physical assistance General transfer comment: Mod A for initial boost, pulling up on back of therapists arms, manual cues to facilitate hip extension, once stand took small pivot steps to chair with min A  for knee buckling and cues for slow controlled descent Ambulation/Gait General Gait Details: deferred due to increased in HR and fatigue    ADL:    Cognition: Cognition Overall Cognitive Status: Within Functional Limits for tasks assessed Orientation Level: Oriented X4 Cognition Arousal/Alertness: Awake/alert Behavior During Therapy: Flat affect, WFL for tasks assessed/performed Overall  Cognitive Status: Within Functional Limits for tasks assessed  Blood pressure 91/58, pulse 82, temperature 97.6 F (36.4 C), temperature source Oral, resp. rate 18, height 5' 5"  (1.651 m), weight 83 kg (182 lb 15.7 oz), SpO2 96 %. Physical Exam  Vitals reviewed. Constitutional: She is oriented to person, place, and time. She appears well-developed and well-nourished.  HENT:  Head: Normocephalic and atraumatic.  Eyes: Conjunctivae and EOM are normal.  Neck: Normal range of motion. Neck supple. No thyromegaly present.  Cardiovascular: Normal rate and regular rhythm.   Respiratory: Effort normal and breath sounds normal. No respiratory distress.  GI: Soft.  Bowel sounds are normal. She exhibits no distension.  Musculoskeletal: She exhibits no edema or tenderness.  Neurological: She is alert and oriented to person, place, and time.  Sensation intact to light touch DTRs symmetric  Motor: B/l UE 4/5 proximally, 5/5 hand grip B/l LE: hip flexion 2/5, knee extension 2/5, ankle dorsi/plantar flexion 4+/5  Skin: Skin is warm and dry.  Psychiatric: Her speech is normal and behavior is normal.    Results for orders placed or performed during the hospital encounter of 12/18/15 (from the past 24 hour(s))  CBC with Differential     Status: Abnormal   Collection Time: 12/18/15  2:45 PM  Result Value Ref Range   WBC 12.9 (H) 4.0 - 10.5 K/uL   RBC 3.85 (L) 3.87 - 5.11 MIL/uL   Hemoglobin 9.7 (L) 12.0 - 15.0 g/dL   HCT 32.1 (L) 36.0 - 46.0 %   MCV 83.4 78.0 - 100.0 fL   MCH 25.2 (L) 26.0 - 34.0 pg   MCHC 30.2 30.0 - 36.0 g/dL   RDW 18.5 (H) 11.5 - 15.5 %   Platelets 352 150 - 400 K/uL   Neutrophils Relative % 84 %   Lymphocytes Relative 12 %   Monocytes Relative 4 %   Eosinophils Relative 0 %   Basophils Relative 0 %   Neutro Abs 10.9 (H) 1.7 - 7.7 K/uL   Lymphs Abs 1.5 0.7 - 4.0 K/uL   Monocytes Absolute 0.5 0.1 - 1.0 K/uL   Eosinophils Absolute 0.0 0.0 - 0.7 K/uL   Basophils Absolute 0.0  0.0 - 0.1 K/uL   RBC Morphology POLYCHROMASIA PRESENT   Basic metabolic panel     Status: Abnormal   Collection Time: 12/18/15  2:45 PM  Result Value Ref Range   Sodium 141 135 - 145 mmol/L   Potassium 3.4 (L) 3.5 - 5.1 mmol/L   Chloride 106 101 - 111 mmol/L   CO2 27 22 - 32 mmol/L   Glucose, Bld 97 65 - 99 mg/dL   BUN 9 6 - 20 mg/dL   Creatinine, Ser 0.57 0.44 - 1.00 mg/dL   Calcium 7.6 (L) 8.9 - 10.3 mg/dL   GFR calc non Af Amer >60 >60 mL/min   GFR calc Af Amer >60 >60 mL/min   Anion gap 8 5 - 15  Brain natriuretic peptide     Status: Abnormal   Collection Time: 12/18/15  2:45 PM  Result Value Ref Range   B Natriuretic Peptide 345.4 (H) 0.0 - 100.0 pg/mL  Urinalysis, Routine w reflex microscopic     Status: Abnormal   Collection Time: 12/18/15  4:19 PM  Result Value Ref Range   Color, Urine YELLOW YELLOW   APPearance CLOUDY (A) CLEAR   Specific Gravity, Urine 1.015 1.005 - 1.030   pH 7.0 5.0 - 8.0   Glucose, UA NEGATIVE NEGATIVE mg/dL   Hgb urine dipstick TRACE (A) NEGATIVE   Bilirubin Urine NEGATIVE NEGATIVE   Ketones, ur NEGATIVE NEGATIVE mg/dL   Protein, ur NEGATIVE NEGATIVE mg/dL   Nitrite NEGATIVE NEGATIVE   Leukocytes, UA NEGATIVE NEGATIVE  Urine culture     Status: None (Preliminary result)   Collection Time: 12/18/15  4:19 PM  Result Value Ref Range   Specimen Description URINE, CLEAN CATCH    Special Requests NONE    Culture      TOO YOUNG TO READ Performed at [facility]    Report Status PENDING   Urine microscopic-add on  Status: Abnormal   Collection Time: 12/18/15  4:19 PM  Result Value Ref Range   Squamous Epithelial / LPF 6-30 (A) NONE SEEN   WBC, UA 0-5 0 - 5 WBC/hpf   RBC / HPF 0-5 0 - 5 RBC/hpf   Bacteria, UA FEW (A) NONE SEEN  Troponin I     Status: Abnormal   Collection Time: 12/18/15  7:30 PM  Result Value Ref Range   Troponin I 0.05 (H) <0.031 ng/mL  Glucose, capillary     Status: None   Collection Time: 12/18/15  7:37 PM    Result Value Ref Range   Glucose-Capillary 73 65 - 99 mg/dL  Lactic acid, plasma     Status: None   Collection Time: 12/18/15 11:40 PM  Result Value Ref Range   Lactic Acid, Venous 1.2 0.5 - 2.0 mmol/L  Troponin I     Status: Abnormal   Collection Time: 12/19/15  1:01 AM  Result Value Ref Range   Troponin I 0.05 (H) <0.031 ng/mL  Lactic acid, plasma     Status: None   Collection Time: 12/19/15  1:01 AM  Result Value Ref Range   Lactic Acid, Venous 0.8 0.5 - 2.0 mmol/L  Vitamin B12     Status: None   Collection Time: 12/19/15  1:01 AM  Result Value Ref Range   Vitamin B-12 184 180 - 914 pg/mL  TSH     Status: Abnormal   Collection Time: 12/19/15  1:07 AM  Result Value Ref Range   TSH 5.581 (H) 0.350 - 4.500 uIU/mL  MRSA PCR Screening     Status: None   Collection Time: 12/19/15  2:55 AM  Result Value Ref Range   MRSA by PCR NEGATIVE NEGATIVE  Troponin I     Status: None   Collection Time: 12/19/15  7:02 AM  Result Value Ref Range   Troponin I 0.03 <0.031 ng/mL  Basic metabolic panel     Status: Abnormal   Collection Time: 12/19/15  7:02 AM  Result Value Ref Range   Sodium 137 135 - 145 mmol/L   Potassium 4.0 3.5 - 5.1 mmol/L   Chloride 100 (L) 101 - 111 mmol/L   CO2 26 22 - 32 mmol/L   Glucose, Bld 237 (H) 65 - 99 mg/dL   BUN 12 6 - 20 mg/dL   Creatinine, Ser 0.71 0.44 - 1.00 mg/dL   Calcium 7.6 (L) 8.9 - 10.3 mg/dL   GFR calc non Af Amer >60 >60 mL/min   GFR calc Af Amer >60 >60 mL/min   Anion gap 11 5 - 15  CBC     Status: Abnormal   Collection Time: 12/19/15  7:02 AM  Result Value Ref Range   WBC 8.5 4.0 - 10.5 K/uL   RBC 3.61 (L) 3.87 - 5.11 MIL/uL   Hemoglobin 8.9 (L) 12.0 - 15.0 g/dL   HCT 29.2 (L) 36.0 - 46.0 %   MCV 80.9 78.0 - 100.0 fL   MCH 24.7 (L) 26.0 - 34.0 pg   MCHC 30.5 30.0 - 36.0 g/dL   RDW 18.4 (H) 11.5 - 15.5 %   Platelets 265 150 - 400 K/uL  Glucose, capillary     Status: Abnormal   Collection Time: 12/19/15  8:14 AM  Result Value Ref  Range   Glucose-Capillary 179 (H) 65 - 99 mg/dL   Comment 1 Notify RN    Comment 2 Document in Chart   Glucose, capillary  Status: Abnormal   Collection Time: 12/19/15 12:05 PM  Result Value Ref Range   Glucose-Capillary 181 (H) 65 - 99 mg/dL   Comment 1 Notify RN    Comment 2 Document in Chart    Dg Chest 2 View  12/19/2015  CLINICAL DATA:  Recent admit for UTI EXAM: CHEST  2 VIEW COMPARISON:  12/18/2015 FINDINGS: Cardiac shadow is mildly enlarged. A pacing device is again seen. Patchy infiltrates are again noted throughout both lungs although some mild improved aeration is seen. The infiltrative changes are noted worst in the right lung base. No acute bony abnormality is noted. IMPRESSION: Slight improvement in bilateral infiltrates Electronically Signed   By: Inez Catalina M.D.   On: 12/19/2015 08:02   Dg Chest 2 View  12/18/2015  CLINICAL DATA:  Cough, lupus, history of asthma EXAM: CHEST  2 VIEW COMPARISON:  CT chest 12/10/2015 FINDINGS: There are bilateral interstitial and alveolar airspace opacities. There are bilateral small pleural effusions. There is no pneumothorax. The heart and mediastinal contours are unremarkable. There is a cardiac pacemaker. The osseous structures are unremarkable. IMPRESSION: Bilateral interstitial and alveolar airspace opacities which may reflect pulmonary edema versus multi lobar pneumonia. Electronically Signed   By: Kathreen Devoid   On: 12/18/2015 14:07   Dg Lumbar Spine Complete  12/18/2015  CLINICAL DATA:  Lower extremity weakness for 2 months. EXAM: LUMBAR SPINE - COMPLETE 4+ VIEW COMPARISON:  CT, 12/14/2015 FINDINGS: L5 is a transitional lumbosacral vertebrae. There is no fracture spondylolisthesis. There are no bone lesions. No degenerative changes. Mild straightening of the normal lumbar lordosis unchanged from the CT. Soft tissues are unremarkable. IMPRESSION: 1. No fracture or acute finding. 2. Mild straightening of the normal lumbar lordosis and  transitional lumbosacral vertebrae. Otherwise unremarkable. Electronically Signed   By: Lajean Manes M.D.   On: 12/18/2015 23:44    Assessment/Plan: Diagnosis: Steroid myopathy +/- CIP +/- CIM (given pt's history of chronic steroids, recentste infections, and recent hospitalizations) Labs and images independently reviewed.  Records reviewed and summated above.  1. Does the need for close, 24 hr/day medical supervision in concert with the patient's rehab needs make it unreasonable for this patient to be served in a less intensive setting? Potentially  2. Co-Morbidities requiring supervision/potential complications: diastolic congestive heart failure, congenital AV block with pacemaker (monitor with increased activity), lupus with chronic prednisone and immunosuppressive, chronic oxygen (cont to monitor with increased activity), chronic hypotension (monitor with increased ambulation), tachypnea (monitor RR and O2 Sats with increased physical exertion), ABLA (transfuse if necessary to ensure appropriate perfusion for increased activity tolerance) 3. Due to safety, disease management and patient education, does the patient require 24 hr/day rehab nursing? Potentially 4. Does the patient require coordinated care of a physician, rehab nurse, PT (1-2 hrs/day, 5 days/week) and OT (1-2 hrs/day, 5 days/week) to address physical and functional deficits in the context of the above medical diagnosis(es)? Potentially Addressing deficits in the following areas: endurance, locomotion, strength, transferring, bathing, grooming, toileting and psychosocial support 5. Can the patient actively participate in an intensive therapy program of at least 3 hrs of therapy per day at least 5 days per week? Potentially 6. The potential for patient to make measurable gains while on inpatient rehab is TBD 7. Anticipated functional outcomes upon discharge from inpatient rehab are TBD  with PT, TBD with OT, n/a with SLP. 8. Estimated  rehab length of stay to reach the above functional goals is: TBD 9. Does the patient have adequate  social supports and living environment to accommodate these discharge functional goals? Yes 10. Anticipated D/C setting: Home 11. Anticipated post D/C treatments: HH therapy and Home excercise program 12. Overall Rehab/Functional Prognosis: good  RECOMMENDATIONS: This patient's condition is appropriate for continued rehabilitative care in the following setting: Will await consult from therapies before determining most appropriate discharge disposition for pt.  [facility] CIR. Patient has agreed to participate in recommended program. Yes Note that insurance prior authorization may be required for reimbursement for recommended care.  Comment: Rehab Admissions Coordinator to follow up.  Delice Lesch, MD 12/19/2015

## 2015-12-19 NOTE — Progress Notes (Signed)
[facility] PCP Visit - Progress Note  I have seen the patient and discussed current hospitalization. I visited [place] today. She was resting in hospital bed and appeared comfortable. She is tired from recent hospitalizations, only spent 1 day outside hospital since last admit for Pyelo. Now concern is for potential HCAP. She remains in good spirits. Chart review shows about a 10-15 lb weight loss in 4 months, likely with chronic illness.  Regarding her Chronic Interstitial Lung Disease, she is followed by [facility] (Dr Maple Hudson) last appointment was 07/2015, she was on 2L O2, at that point she had CT scan with some persistent dense pulmonary consolidation, questions were if this was lupus pneumonitis vs previously Pulm had questioned if there was a component of seronegative sarcoidosis, previously in 2014 when interstitial lung disease problems started and she had bronchoscopy she just had BAL for washings per pulm, and did not have a lung biopsy, Pulm seems interested in pursuing a lung biopsy, however barriers with cardiac clearance (which she was recently denied for dental procedure). Additionally, patient has seen [facility] Dr Susa Simmonds for 2nd opinion in 09/2015, no different recommendations at that time.  I agree with the current hospital plan outlined by the primary team of [facility], her problems may reflect an interval worsening of her Chronic Interstitial Lung Disease or primary problem, as opposed to an acute infectious PNA etiology.  Also of note, patient is missing her [facility] follow-up today 12/19/15 with Dr Maple Hudson. I would consider an inpatient consult to Pulmonology, given recurrent admissions now with the question of pursuing any potential repeat bronch vs lung biopsy while inpatient.  I will anticipate to follow-up with the patient in the clinic after discharge from hospital.  Saralyn Pilar, DO [facility], PGY-3

## 2015-12-19 NOTE — Progress Notes (Signed)
Rehab Admissions Coordinator Note:  Patient was screened by Trish Mage for appropriateness for an Inpatient Acute Rehab Consult.  At this time, we are recommending Inpatient Rehab consult.  Trish Mage 12/19/2015, 12:02 PM  I can be reached at [phone].

## 2015-12-19 NOTE — Evaluation (Signed)
Physical Therapy Evaluation Patient Details Name: Makayla Vasquez MRN: [id number] DOB: 11-Aug-1981 Today's Date: 12/19/2015   History of Present Illness  Makayla Vasquez is a 35 y.o. female presenting with LE weakness and pain, urinary incontinence, and shortness of breath. PMH is significant for HFrEF (EF 40-45%), congenital AV block with pace maker, Lupus on chronic prednisone and immunosuppressive therapy.   Clinical Impression  Pt with extensive medical history presenting today with the PT deficits listed below. PTA patient was independent in her home environment however the past three weeks needed physical assistance from her boyfriend for all transfers and ADLs due to progressive weakness in LEs. Today pt requiring Min-Mod A x 2 for all mobility and transfers. Pt shows functional strength but needing assistance for completing transitions. Deferred further ambulation due to increase in HR and fatigue. Pt would make a good candidate for inpatient rehab at D/C and can defintiely tolerate 3 hours of therapy a day to regain baseline level of Independence.  If pt declines she would need a RW and W/C for home use with 24/7 supervision/assistance and maximize HHPT services.    Follow Up Recommendations CIR;Supervision/Assistance - 24 hour    Equipment Recommendations  Other (comment) (TBD by next venue, w/c/RW needed if returning home)    Recommendations for Other Services Rehab consult     Precautions / Restrictions Precautions Precautions: Fall;ICD/Pacemaker Restrictions Weight Bearing Restrictions: No      Mobility  Bed Mobility Overal bed mobility: Needs Assistance Bed Mobility: Supine to Sit     Supine to sit: Mod assist     General bed mobility comments: pt pivoted with assit from bed rails, assist needed to bring LEs to EOBa nd to bring trunk upright as pt fatigued quickly  Transfers Overall transfer level: Needs assistance Equipment used: 2 person hand held assist Transfers:  Sit to/from Stand;Stand Pivot Transfers Sit to Stand: Mod assist;+2 physical assistance Stand pivot transfers: Min assist;+2 physical assistance       General transfer comment: Mod A for initial boost, pulling up on back of therapists arms, manual cues to facilitate hip extension, once stand took small pivot steps to chair with min A  for knee buckling and cues for slow controlled descent  Ambulation/Gait             General Gait Details: deferred due to increased in HR and fatigue  Stairs            Wheelchair Mobility    Modified Rankin (Stroke Patients Only)       Balance Overall balance assessment: Needs assistance Sitting-balance support: Feet supported;Bilateral upper extremity supported Sitting balance-Leahy Scale: Fair     Standing balance support: Bilateral upper extremity supported;During functional activity Standing balance-Leahy Scale: Poor                               Pertinent Vitals/Pain Pain Assessment: 0-10 Pain Score: 3  Pain Location: bilateral legs Pain Descriptors / Indicators: Cramping;Tightness Pain Intervention(s): Limited activity within patient's tolerance;Monitored during session    Home Living Family/patient expects to be discharged to:: Private residence Living Arrangements: Spouse/significant other Available Help at Discharge: Family;Available 24 hours/day (boyfriend available) Type of Home: Apartment Home Access: Stairs to enter Entrance Stairs-Rails: Left Entrance Stairs-Number of Steps: 2 short steps Home Layout: One level Home Equipment: None      Prior Function Level of Independence: Independent;Needs assistance   Gait / Transfers Assistance Needed:  pt states she needs assist to get out of tub at home, walks on her own typically but has needed boyfriend for all transfers the past 3 weeks  ADL's / Homemaking Assistance Needed: pt has 5 y.o. son,boyfriend has been assisting with all ADLs the past few weeks,  was independent before  Comments: unless in a lupus flare pt is functionally independent with supplemental O2     Hand Dominance   Dominant Hand: Right    Extremity/Trunk Assessment   Upper Extremity Assessment: Defer to OT evaluation           Lower Extremity Assessment: Generalized weakness      Cervical / Trunk Assessment: Normal  Communication   Communication: No difficulties  Cognition Arousal/Alertness: Awake/alert Behavior During Therapy: Flat affect;WFL for tasks assessed/performed Overall Cognitive Status: Within Functional Limits for tasks assessed                      General Comments General comments (skin integrity, edema, etc.): HR to 133 with minimal activity on 2L of O2 saturations remained in low 90s    Exercises General Exercises - Lower Extremity Ankle Circles/Pumps: AROM;10 reps;Both;Supine Quad Sets: AROM;Both;10 reps;Supine Long Arc Quad: AROM;5 reps;Both;Seated Heel Slides: Both;5 reps;AAROM;Supine      Assessment/Plan    PT Assessment Patient needs continued PT services  PT Diagnosis Difficulty walking;Abnormality of gait;Generalized weakness   PT Problem List Decreased strength;Decreased range of motion;Decreased activity tolerance;Decreased balance;Decreased mobility;Decreased coordination;Decreased cognition;Decreased knowledge of use of DME  PT Treatment Interventions DME instruction;Gait training;Stair training;Functional mobility training;Therapeutic activities;Therapeutic exercise;Balance training;Neuromuscular re-education;Patient/family education;Wheelchair mobility training   PT Goals (Current goals can be found in the Care Plan section) Acute Rehab PT Goals Patient Stated Goal: get stronger PT Goal Formulation: With patient Time For Goal Achievement: 01/02/16 Potential to Achieve Goals: Good    Frequency Min 4X/week   Barriers to discharge        Co-evaluation               End of Session Equipment  Utilized During Treatment: Gait belt;Oxygen Activity Tolerance: Patient limited by fatigue Patient left: in chair;with call bell/phone within reach;with nursing/sitter in room Nurse Communication: Mobility status         Time: 5573-2202 PT Time Calculation (min) (ACUTE ONLY): 23 min   Charges:   PT Evaluation $PT Eval Moderate Complexity: 1 Procedure PT Treatments $Therapeutic Activity: 8-22 mins   PT G Codes:        Ulyses Jarred Jan 15, 2016, 11:38 AM Ulyses Jarred, Student Physical Therapist Acute Rehab [phone]

## 2015-12-20 ENCOUNTER — Inpatient Hospital Stay (HOSPITAL_COMMUNITY)
Admission: AD | Admit: 2015-12-20 | Discharge: 2015-12-27 | DRG: 091 | Disposition: A | Discharge status: Home Health | Payer: Medicare Other | Source: Intra-hospital | Attending: Physical Medicine & Rehabilitation | Admitting: Physical Medicine & Rehabilitation

## 2015-12-20 DIAGNOSIS — R739 Hyperglycemia, unspecified: Secondary | ICD-10-CM | POA: Diagnosis not present

## 2015-12-20 DIAGNOSIS — E669 Obesity, unspecified: Secondary | ICD-10-CM

## 2015-12-20 DIAGNOSIS — E11649 Type 2 diabetes mellitus with hypoglycemia without coma: Secondary | ICD-10-CM | POA: Diagnosis not present

## 2015-12-20 DIAGNOSIS — G720 Drug-induced myopathy: Principal | ICD-10-CM | POA: Diagnosis present

## 2015-12-20 DIAGNOSIS — D649 Anemia, unspecified: Secondary | ICD-10-CM | POA: Diagnosis present

## 2015-12-20 DIAGNOSIS — E1169 Type 2 diabetes mellitus with other specified complication: Secondary | ICD-10-CM

## 2015-12-20 DIAGNOSIS — I5023 Acute on chronic systolic (congestive) heart failure: Secondary | ICD-10-CM | POA: Diagnosis present

## 2015-12-20 DIAGNOSIS — I5022 Chronic systolic (congestive) heart failure: Secondary | ICD-10-CM | POA: Diagnosis present

## 2015-12-20 DIAGNOSIS — R531 Weakness: Secondary | ICD-10-CM | POA: Diagnosis not present

## 2015-12-20 DIAGNOSIS — I110 Hypertensive heart disease with heart failure: Secondary | ICD-10-CM | POA: Diagnosis present

## 2015-12-20 DIAGNOSIS — K5900 Constipation, unspecified: Secondary | ICD-10-CM | POA: Diagnosis present

## 2015-12-20 DIAGNOSIS — Z888 Allergy status to other drugs, medicaments and biological substances status: Secondary | ICD-10-CM | POA: Diagnosis not present

## 2015-12-20 DIAGNOSIS — K5901 Slow transit constipation: Secondary | ICD-10-CM | POA: Diagnosis present

## 2015-12-20 DIAGNOSIS — Z79899 Other long term (current) drug therapy: Secondary | ICD-10-CM

## 2015-12-20 DIAGNOSIS — R5381 Other malaise: Secondary | ICD-10-CM | POA: Diagnosis not present

## 2015-12-20 DIAGNOSIS — K219 Gastro-esophageal reflux disease without esophagitis: Secondary | ICD-10-CM | POA: Diagnosis present

## 2015-12-20 DIAGNOSIS — D638 Anemia in other chronic diseases classified elsewhere: Secondary | ICD-10-CM | POA: Diagnosis present

## 2015-12-20 DIAGNOSIS — N12 Tubulo-interstitial nephritis, not specified as acute or chronic: Secondary | ICD-10-CM | POA: Diagnosis not present

## 2015-12-20 DIAGNOSIS — F3170 Bipolar disorder, currently in remission, most recent episode unspecified: Secondary | ICD-10-CM | POA: Diagnosis present

## 2015-12-20 DIAGNOSIS — Z91048 Other nonmedicinal substance allergy status: Secondary | ICD-10-CM | POA: Diagnosis not present

## 2015-12-20 DIAGNOSIS — T380X5A Adverse effect of glucocorticoids and synthetic analogues, initial encounter: Secondary | ICD-10-CM | POA: Diagnosis present

## 2015-12-20 DIAGNOSIS — J9611 Chronic respiratory failure with hypoxia: Secondary | ICD-10-CM | POA: Diagnosis present

## 2015-12-20 DIAGNOSIS — G722 Myopathy due to other toxic agents: Secondary | ICD-10-CM | POA: Diagnosis not present

## 2015-12-20 DIAGNOSIS — E1165 Type 2 diabetes mellitus with hyperglycemia: Secondary | ICD-10-CM | POA: Diagnosis present

## 2015-12-20 DIAGNOSIS — F410 Panic disorder [episodic paroxysmal anxiety] without agoraphobia: Secondary | ICD-10-CM | POA: Diagnosis present

## 2015-12-20 DIAGNOSIS — Z794 Long term (current) use of insulin: Secondary | ICD-10-CM | POA: Diagnosis not present

## 2015-12-20 DIAGNOSIS — J189 Pneumonia, unspecified organism: Secondary | ICD-10-CM | POA: Diagnosis present

## 2015-12-20 DIAGNOSIS — J9601 Acute respiratory failure with hypoxia: Secondary | ICD-10-CM

## 2015-12-20 DIAGNOSIS — F319 Bipolar disorder, unspecified: Secondary | ICD-10-CM | POA: Diagnosis present

## 2015-12-20 DIAGNOSIS — M329 Systemic lupus erythematosus, unspecified: Secondary | ICD-10-CM

## 2015-12-20 LAB — GLUCOSE, CAPILLARY
GLUCOSE-CAPILLARY: 233 mg/dL — AB (ref 65–99)
GLUCOSE-CAPILLARY: 196 mg/dL — AB (ref 65–99)
Glucose-Capillary: 142 mg/dL — ABNORMAL HIGH (ref 65–99)
Glucose-Capillary: 283 mg/dL — ABNORMAL HIGH (ref 65–99)

## 2015-12-20 LAB — URINE CULTURE

## 2015-12-20 LAB — C3 COMPLEMENT: C3 COMPLEMENT: 90 mg/dL (ref 82–167)

## 2015-12-20 LAB — T4, FREE: Free T4: 1.34 ng/dL — ABNORMAL HIGH (ref 0.61–1.12)

## 2015-12-20 LAB — ANTI-DNA ANTIBODY, DOUBLE-STRANDED

## 2015-12-20 LAB — C4 COMPLEMENT: COMPLEMENT C4, BODY FLUID: 25 mg/dL (ref 14–44)

## 2015-12-20 LAB — COMPLEMENT, TOTAL: Compl, Total (CH50): 60 U/mL (ref 42–60)

## 2015-12-20 MED ORDER — INSULIN ASPART 100 UNIT/ML ~~LOC~~ SOLN
0.0000 [IU] | Freq: Three times a day (TID) | SUBCUTANEOUS | Status: DC
  Administered 2015-12-21: 5 [IU] via SUBCUTANEOUS
  Administered 2015-12-21: 2 [IU] via SUBCUTANEOUS
  Administered 2015-12-21: 5 [IU] via SUBCUTANEOUS
  Administered 2015-12-22: 8 [IU] via SUBCUTANEOUS
  Administered 2015-12-22: 5 [IU] via SUBCUTANEOUS
  Administered 2015-12-23: 8 [IU] via SUBCUTANEOUS
  Administered 2015-12-23: 11 [IU] via SUBCUTANEOUS
  Administered 2015-12-23: 8 [IU] via SUBCUTANEOUS
  Administered 2015-12-24: 5 [IU] via SUBCUTANEOUS
  Administered 2015-12-24: 8 [IU] via SUBCUTANEOUS

## 2015-12-20 MED ORDER — ALUM & MAG HYDROXIDE-SIMETH 200-200-20 MG/5ML PO SUSP: 30.0000 mL | ORAL | Status: DC | PRN

## 2015-12-20 MED ORDER — PREDNISONE 10 MG PO TABS
30.0000 mg | ORAL_TABLET | Freq: Two times a day (BID) | ORAL | Status: DC
  Administered 2015-12-20: 30 mg via ORAL
  Filled 2015-12-20: qty 3

## 2015-12-20 MED ORDER — MYCOPHENOLATE MOFETIL 250 MG PO CAPS
500.0000 mg | ORAL_CAPSULE | Freq: Two times a day (BID) | ORAL | Status: DC
  Administered 2015-12-20 – 2015-12-27 (×14): 500 mg via ORAL
  Filled 2015-12-20 (×14): qty 2

## 2015-12-20 MED ORDER — FLEET ENEMA 7-19 GM/118ML RE ENEM: 1.0000 | ENEMA | Freq: Once | RECTAL | Status: DC | PRN

## 2015-12-20 MED ORDER — FUROSEMIDE 20 MG PO TABS
20.0000 mg | ORAL_TABLET | Freq: Every day | ORAL | Status: DC
  Administered 2015-12-21 – 2015-12-27 (×7): 20 mg via ORAL
  Filled 2015-12-20 (×7): qty 1

## 2015-12-20 MED ORDER — INSULIN ASPART 100 UNIT/ML ~~LOC~~ SOLN: 0.0000 [IU] | Freq: Every day | SUBCUTANEOUS | Status: DC

## 2015-12-20 MED ORDER — PREDNISONE 10 MG PO TABS: ORAL_TABLET | ORAL | Status: DC

## 2015-12-20 MED ORDER — ENOXAPARIN SODIUM 40 MG/0.4ML ~~LOC~~ SOLN
40.0000 mg | SUBCUTANEOUS | Status: DC
  Administered 2015-12-20 – 2015-12-26 (×7): 40 mg via SUBCUTANEOUS
  Filled 2015-12-20 (×7): qty 0.4

## 2015-12-20 MED ORDER — PROCHLORPERAZINE MALEATE 5 MG PO TABS: 5.0000 mg | ORAL_TABLET | Freq: Four times a day (QID) | ORAL | Status: DC | PRN

## 2015-12-20 MED ORDER — INSULIN GLARGINE 100 UNIT/ML ~~LOC~~ SOLN
5.0000 [IU] | Freq: Every day | SUBCUTANEOUS | Status: DC
  Administered 2015-12-20: 5 [IU] via SUBCUTANEOUS
  Filled 2015-12-20 (×2): qty 0.05

## 2015-12-20 MED ORDER — GUAIFENESIN-DM 100-10 MG/5ML PO SYRP: 5.0000 mL | ORAL_SOLUTION | Freq: Four times a day (QID) | ORAL | Status: DC | PRN

## 2015-12-20 MED ORDER — MOMETASONE FURO-FORMOTEROL FUM 100-5 MCG/ACT IN AERO
2.0000 | INHALATION_SPRAY | Freq: Two times a day (BID) | RESPIRATORY_TRACT | Status: DC
  Administered 2015-12-21 – 2015-12-26 (×11): 2 via RESPIRATORY_TRACT
  Filled 2015-12-20 (×2): qty 8.8

## 2015-12-20 MED ORDER — FAMOTIDINE 20 MG PO TABS
20.0000 mg | ORAL_TABLET | Freq: Two times a day (BID) | ORAL | Status: DC
Start: 2015-12-20 — End: 2015-12-27
  Administered 2015-12-20 – 2015-12-27 (×14): 20 mg via ORAL
  Filled 2015-12-20 (×14): qty 1

## 2015-12-20 MED ORDER — CEPHALEXIN 250 MG PO CAPS
500.0000 mg | ORAL_CAPSULE | Freq: Four times a day (QID) | ORAL | Status: DC
  Administered 2015-12-21 – 2015-12-27 (×27): 500 mg via ORAL
  Filled 2015-12-20 (×27): qty 2

## 2015-12-20 MED ORDER — INSULIN ASPART 100 UNIT/ML ~~LOC~~ SOLN
0.0000 [IU] | Freq: Every day | SUBCUTANEOUS | Status: DC
  Administered 2015-12-21: 3 [IU] via SUBCUTANEOUS

## 2015-12-20 MED ORDER — PREDNISONE 5 MG PO TABS
30.0000 mg | ORAL_TABLET | Freq: Two times a day (BID) | ORAL | Status: DC
  Administered 2015-12-21: 30 mg via ORAL
  Filled 2015-12-20: qty 1

## 2015-12-20 MED ORDER — POLYETHYLENE GLYCOL 3350 17 G PO PACK: 17.0000 g | PACK | Freq: Every day | ORAL | Status: DC | PRN

## 2015-12-20 MED ORDER — BISACODYL 10 MG RE SUPP: 10.0000 mg | Freq: Every day | RECTAL | Status: DC | PRN

## 2015-12-20 MED ORDER — PROCHLORPERAZINE 25 MG RE SUPP: 12.5000 mg | Freq: Four times a day (QID) | RECTAL | Status: DC | PRN

## 2015-12-20 MED ORDER — TRAZODONE HCL 50 MG PO TABS: 25.0000 mg | ORAL_TABLET | Freq: Every evening | ORAL | Status: DC | PRN

## 2015-12-20 MED ORDER — INSULIN ASPART 100 UNIT/ML ~~LOC~~ SOLN
0.0000 [IU] | Freq: Three times a day (TID) | SUBCUTANEOUS | Status: DC
  Administered 2015-12-20: 5 [IU] via SUBCUTANEOUS
  Administered 2015-12-20: 8 [IU] via SUBCUTANEOUS

## 2015-12-20 MED ORDER — METHOCARBAMOL 500 MG PO TABS: 500.0000 mg | ORAL_TABLET | Freq: Four times a day (QID) | ORAL | Status: DC | PRN

## 2015-12-20 MED ORDER — PROCHLORPERAZINE EDISYLATE 5 MG/ML IJ SOLN: 5.0000 mg | Freq: Four times a day (QID) | INTRAMUSCULAR | Status: DC | PRN

## 2015-12-20 MED ORDER — OXYCODONE-ACETAMINOPHEN 5-325 MG PO TABS
1.0000 | ORAL_TABLET | Freq: Four times a day (QID) | ORAL | Status: DC | PRN
  Administered 2015-12-20 – 2015-12-23 (×2): 1 via ORAL
  Filled 2015-12-20 (×2): qty 1

## 2015-12-20 MED ORDER — ALBUTEROL SULFATE (2.5 MG/3ML) 0.083% IN NEBU: 2.5000 mg | INHALATION_SOLUTION | RESPIRATORY_TRACT | Status: DC | PRN

## 2015-12-20 NOTE — Progress Notes (Signed)
Makayla Mage, RN Rehab Admission Coordinator Signed Physical Medicine and Rehabilitation PMR Pre-admission 12/20/2015 3:48 PM  Related encounter: ED to Hosp-Admission (Current) from 12/18/2015 in [facility] MEDICAL    Expand All Collapse All   PMR Admission Coordinator Pre-Admission Assessment  Patient: Makayla Vasquez is an 35 y.o., female MRN: [id number] DOB: August 16, 1981 Height: 5\' 5"  (165.1 cm) Weight: 77.61 kg (171 lb 1.6 oz)  Insurance Information HMO: PPO: PCP: IPA: 80/20: OTHER:  PRIMARY: Medicare A/B Policy#: [id number] Subscriber: Meriel Pica CM Name: Phone#: Fax#:  Pre-Cert#: Employer: Disabled/Not employed Benefits: Phone #: Name: Checked in [place]. Date: 05/20/06 Deduct: $1316 Out of Pocket Max: none Life Max: unlimited CIR: 100% SNF: 100 days Outpatient: 80% Co-Pay: 20% Home Health: 100% Co-Pay: none DME: 80% Co-Pay: 20% Providers: patient's choice  SECONDARY: Medicaid [place] access Policy#: [id number] Subscriber: Meriel Pica CM Name: Phone#: Fax#:  Pre-Cert#: Employer: Not employed/Disabled Benefits: Phone #: [phone] Name: Automated Eff. 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 She was placed on IV antibiotics and treated with gentle diuresis. Cardiac enzymes noted to be mildly elevated due to demand ischemia and Dr. Tyson Alias consulted for input on dyspnea and recommended stress dose steroids to help with symptoms. Patient to continue on slow steroid taper and antibiotics narrowed to treat UTI/Pyel ad PCCM doubts HCAP. Respiratory status is improving but she continues to be limited by BLE weakness and fatigue. CIR recommended for follow up therapy.   Past Medical History  Past Medical History  Diagnosis Date  . Cardiac pacemaker     a. Since age 79 in 67. b. Upgrade to BiV in 2013.  . Congenital complete AV block   . Obesity   . GERD (gastroesophageal reflux disease)   . Asymptomatic LV dysfunction     a. Echo in Dec 2011 with EF 35 to 40%. Felt to be due to paced rhythm. b. EF 25-30% in 07/2014.  . Seizures (HCC)     as a child- from high fever  . Anxiety   . Bipolar affective disorder (HCC)   . Depression     bipolar  . Carpal tunnel syndrome of right wrist   . Asthma     seasonal allergies   . Arthritis     rheumatoid arthritis- mild, no rheumatology care   . Hypertension   . Pneumonitis     a. a/w hypoxia - inflammatory - large workup 07/2014.  [name] Sinus tachycardia (HCC)   . Diabetes mellitus without complication (HCC)   . Presence of permanent cardiac pacemaker   . CHF (congestive heart failure) (HCC)   . Lupus (HCC)   . Lupus (systemic lupus erythematosus) (HCC)     Family History  family history includes Heart disease in her father and mother;  Heart disease (age of onset: 59) in her sister; Hypertension in her mother; Lupus in her mother.  Prior Rehab/Hospitalizations: Had outpatient therapy 3 months ago at [facility] per patient.  Has the patient had major surgery during 100 days prior to admission? No  Current Medications   Current facility-administered medications:  . 0.9 % sodium chloride infusion, 250 mL, Intravenous, PRN, Palma Holter, MD . acetaminophen (TYLENOL) tablet 650 mg, 650 mg, Oral, Q6H PRN **OR** [DISCONTINUED] acetaminophen (TYLENOL) suppository 650 mg, 650 mg, Rectal, Q6H PRN, Palma Holter, MD . albuterol (PROVENTIL) (2.5 MG/3ML) 0.083% nebulizer solution 2.5 mg, 2.5 mg, Inhalation, Q4H PRN, Palma Holter, MD . cephALEXin (KEFLEX) capsule 500 mg, 500 mg, Oral, 4 times per day, Palma Holter, MD, 500 mg at 12/20/15 1216 . enoxaparin (LOVENOX) injection 40 mg, 40 mg, Subcutaneous, Q24H, Palma Holter, MD, 40 mg at 12/19/15 1932 . furosemide (LASIX) tablet 20 mg, 20 mg, Oral, Daily, Palma Holter, MD, 20 mg at 12/20/15 0813 . insulin aspart (novoLOG) injection 0-15 Units, 0-15 Units, Subcutaneous, TID WC, Arvilla Market, DO, 8 Units at 12/20/15 1216 . insulin aspart (novoLOG) injection 0-5 Units, 0-5 Units, Subcutaneous, QHS, Arvilla Market, DO . mometasone-formoterol (DULERA) 100-5 MCG/ACT inhaler 2 puff, 2 puff, Inhalation, BID, Palma Holter, MD, 2 puff at 12/19/15 2015 . mycophenolate (CELLCEPT) capsule 500 mg, 500 mg, Oral, BID, Palma Holter, MD, 500 mg at 12/20/15 2778 . oxyCODONE-acetaminophen (PERCOCET/ROXICET) 5-325 MG per tablet 1 tablet, 1 tablet, Oral, Q6H PRN, Palma Holter, MD, 1 tablet at 12/20/15 [phone] . polyethylene glycol (MIRALAX / GLYCOLAX) packet 17 g, 17 g, Oral, Daily PRN, Palma Holter, MD . predniSONE (DELTASONE) tablet 30 mg, 30 mg, Oral, BID WC, Bernadene Person, NP . sodium  chloride flush (NS) 0.9 % injection 3 mL, 3 mL, Intravenous, Q12H, Palma Holter, MD, 3 mL at 12/19/15 1000 . sodium chloride flush (NS) 0.9 % injection 3 mL, 3 mL, Intravenous, Q12H, Palma Holter, MD, 3 mL at 12/20/15 0815 . sodium chloride flush (NS) 0.9 % injection 3 mL, 3 mL, Intravenous, PRN, Palma Holter, MD  Patients Current Diet: Diet heart healthy/carb modified Room service appropriate?: Yes; Fluid consistency:: Thin  Precautions / Restrictions Precautions Precautions: Fall, ICD/Pacemaker Restrictions Weight Bearing Restrictions: No   Has the patient had 2 or more falls or a fall with injury in the past year?No. Patient reports 1 fall with no injury.  Prior Activity Level Household: Homebound, went out 1-2 X a month  Journalist, newspaper / Equipment Home Assistive Devices/Equipment: Oxygen, Eyeglasses, Environmental consultant (specify type) Home Equipment: None  Prior Device Use: Indicate devices/aids used by the patient prior to current illness, exacerbation or injury? None of the above. Patient used her boyfriend for assistance, but no device.  Prior Functional Level Prior Function Level of Independence: Independent, Needs assistance Gait / Transfers Assistance Needed: pt states she needs assist to get out of tub at home, walks on her own typically but has needed boyfriend for all transfers the past 3 weeks ADL's / Homemaking Assistance Needed: pt has 5 y.o. son,boyfriend has been assisting with all ADLs the past few weeks, was independent before Communication / Swallowing Assistance Needed: no difficulties Comments: unless in a lupus flare pt is functionally independent with supplemental O2  Self Care: Did the patient need help bathing, dressing, using the toilet or eating? Needed some help  Indoor Mobility: Did the patient need assistance  with walking from room to room (with or without device)? Needed some help  Stairs: Did the patient need assistance with  internal or external stairs (with or without device)? Needed some help  Functional Cognition: Did the patient need help planning regular tasks such as shopping or remembering to take medications? Independent  Current Functional Level Cognition  Overall Cognitive Status: Within Functional Limits for tasks assessed Orientation Level: Oriented X4   Extremity Assessment (includes Sensation/Coordination)  Upper Extremity Assessment: Generalized weakness  Lower Extremity Assessment: Defer to PT evaluation, Generalized weakness    ADLs  Overall ADL's : Needs assistance/impaired Eating/Feeding: Modified independent, Bed level Grooming: Wash/dry hands, Wash/dry face, Oral care, Set up, Sitting Upper Body Bathing: Supervision/ safety, Set up, Sitting Upper Body Dressing : Supervision/safety, Set up, Sitting Lower Body Dressing: Maximal assistance, +2 for physical assistance, +2 for safety/equipment, Sit to/from stand Toilet Transfer: Minimal assistance, +2 for physical assistance, +2 for safety/equipment, BSC Toilet Transfer Details (indicate cue type and reason): Pt performed SPT from EOB to 3:1 w/ +1 and Mod A today, if taking any steps, recommend +2 for physical assist and safety secondary to bilateral LE weakness. Fatigues easily Toileting- Clothing Manipulation and Hygiene: Maximal assistance, Sitting/lateral lean, Sit to/from stand Functional mobility during ADLs: Minimal assistance, +2 for physical assistance, +2 for safety/equipment, Cueing for safety, Cueing for sequencing General ADL Comments: Pt was educated in role of OT and then participated in ADL retraining session to consist of bed mobility, SPT to 3:1 for toileting and bathing/grooming. Pt will benefit from in-pt Rehab to assist in maximizing independence with ADL's and self care as well as functional transfers as she was independent until several weeks-1 month ago per her report. Pt also has 23 y/o son.    Mobility   Overal bed mobility: Needs Assistance Bed Mobility: Sidelying to Sit, Supine to Sit, Sit to Supine Rolling: Min assist Sidelying to sit: Mod assist Supine to sit: Mod assist General bed mobility comments: pt pivoted with assit from bed rails, assist needed to bring LEs to EOBa and to bring trunk upright as pt fatigues quickly, benefits from rest breaks    Transfers  Overall transfer level: Needs assistance Equipment used: 2 person hand held assist Transfers: Sit to/from Stand, Stand Pivot Transfers Sit to Stand: Min assist, +2 physical assistance, +2 safety/equipment Stand pivot transfers: Min assist, +2 physical assistance, +2 safety/equipment General transfer comment: Mod A for initial boost, pulling up on back of therapists arms, manual cues to facilitate hip extension, once stand took small pivot steps to chair with min A for knee buckling and cues for slow controlled descent    Ambulation / Gait / Stairs / Wheelchair Mobility  Ambulation/Gait General Gait Details: deferred due to increased in HR and fatigue    Posture / Balance Balance Overall balance assessment: Needs assistance Sitting-balance support: Bilateral upper extremity supported, Feet supported Sitting balance-Leahy Scale: Fair Standing balance support: Bilateral upper extremity supported, During functional activity Standing balance-Leahy Scale: Poor    Special needs/care consideration BiPAP/CPAP No CPM No Continuous Drip IV No Dialysis No  Life Vest No Oxygen Yes, 02 4L/[place] Special Bed No Trach Size No Wound Vac (area) No  Skin No  Bowel mgmt: Last BM 12/14/15 Bladder mgmt: Voiding WDL Diabetic mgmt Yes, on insulin and oral medications at home.    Previous Home Environment Living Arrangements: Spouse/significant other Available Help at Discharge: Family, Available 24 hours/day (boyfriend available 24 hr/day) Type of Home: Apartment Home Layout: One  level Home Access: Stairs to enter Entrance Stairs-Rails: Left Entrance Stairs-Number of Steps: 2 short steps Bathroom Shower/Tub: Engineer, manufacturing systems: Standard Home Care Services: No  Discharge Living Setting Plans for Discharge Living Setting: Lives with (comment), Apartment (Lives with boyfriend and 5 yo son.) Type of Home at Discharge: Apartment Discharge Home Layout: One level Discharge Home Access: Stairs to enter Entrance Stairs-Number of Steps: 2 small steps Does the patient have any problems obtaining your medications?: No  Social/Family/Support Systems Patient Roles: Parent, Other (Comment) (Has a boyfriend and a 53 yr old son.) Contact Information: Addylynn Balin - sister [phone] Anticipated Caregiver: Weston Settle - significant other Anticipated Caregiver's Contact Information: Leafy Half - [phone] Ability/Limitations of Caregiver: Boyfriend has been providing care for patient at home and can continue to assist Caregiver Availability: 24/7 Discharge Plan Discussed with Primary Caregiver: Yes Is Caregiver In Agreement with Plan?: Yes Does Caregiver/Family have Issues with Lodging/Transportation while Pt is in Rehab?: No  Goals/Additional Needs Patient/Family Goal for Rehab: PT/OT mod I and supervision goals Expected length of stay: 7-10 days Cultural Considerations: None Dietary Needs: Heart healthy, carb modified, thin liquids Equipment Needs: TBD Pt/Family Agrees to Admission and willing to participate: Yes Program Orientation Provided & Reviewed with Pt/Caregiver Including Roles & Responsibilities: Yes   Decrease burden of Care through IP rehab admission: N/A  Possible need for SNF placement upon discharge: Not planned  Patient Condition: This patient's condition remains as documented in the consult dated 12/19/15, in which the Rehabilitation Physician determined and documented that the patient's condition is appropriate for  intensive rehabilitative care in an inpatient rehabilitation facility. Will admit to inpatient rehab today.  Preadmission Screen Completed By: Makayla Vasquez, 12/20/2015 3:58 PM ______________________________________________________________________  Discussed status with Dr. Wynn Banker on 12/20/15 at 1558 and received telephone approval for admission today.  Admission Coordinator: Makayla Vasquez, time1558/Date03/02/17          Cosigned by: Erick Colace, MD at 12/20/2015 4:07 PM  Revision History     Date/Time User Provider Type Action   12/20/2015 4:07 PM Erick Colace, MD Physician Cosign   12/20/2015 3:59 PM Makayla Mage, RN Rehab Admission Coordinator Sign

## 2015-12-20 NOTE — Care Management Note (Signed)
Case Management Note  Patient Details  Name: Makayla Vasquez MRN: [id number] Date of Birth: 12-18-1980  Subjective/Objective:        Admitted with HCAP,  hx ,systolic heart failure, complete heart block now s/p pacemaker, lupus on chronic  and chronic hypoxic respiratory failure on 4L home O2 . She was recently admitted (just d/c 2/25) with pyelonephritis. Pt has not taken any of her home medications since d/c including keflex for pyelo. She returned 2/28 with c/o BLE weakness and SOB.From home with boyfriend. Pt states boyfriend helps with ADL's . States she used no assistive devices PTA. DME: owns 3 in 1. PCP: Saralyn Pilar.    Action/Plan: CIR  Expected Discharge Date:                  Expected Discharge Plan:  IP Rehab Facility  In-House Referral:     Discharge planning Services  CM Consult  Post Acute Care Choice:  Durable Medical Equipment, Resumption of Svcs/PTA Provider Choice offered to:     DME Arranged:  Oxygen DME Agency:  [facility]  HH Arranged:    HH Agency:     Status of Service:  Completed, signed off  Medicare Important Message Given:    Date Medicare IM Given:    Medicare IM give by:    Date Additional Medicare IM Given:    Additional Medicare Important Message give by:     If discussed at Long Length of Stay Meetings, dates discussed:    Additional Comments:  Epifanio Lesches, [place] [phone] 12/20/2015, 11:17 AM

## 2015-12-20 NOTE — H&P (Signed)
Physical Medicine and Rehabilitation Admission H&P    Chief Complaint  Patient presents with  . Debility.     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 She was readmitted the next day on 02/28 with leucocytosis, BLE weakness and SOB due to acute on chronic CHF and HCAP.  She was placed on IV antibiotics and treated with gentle diuresis. Cardiac enzymes noted to be mildly elevated due to demand ischemia and Dr. Titus Mould consulted for input on dyspnea and recommended stress dose steroids to help with symptoms. Patient to continue on slow steroid taper and antibiotics narrowed to treat UTI/Pyel ad PCCM doubts HCAP. Respiratory status is improving but she continues to be limited by BLE weakness and fatigue. CIR recommended for follow up therapy.   Patient states that over the last 2 months she has been on and off Higher dose steroids. Her baseline dose of steroids is 10 mg of prednisone He denies any back pain. Denies any bladder or bowel issues.  Review of Systems  HENT: Negative for hearing loss and tinnitus.   Eyes: Positive for blurred vision (wears glasses). Negative for double vision.  Respiratory: Negative for cough, sputum production and shortness of breath.   Gastrointestinal: Positive for heartburn and constipation. Negative for nausea and vomiting.  Genitourinary: Negative for dysuria and urgency.  Musculoskeletal: Positive for myalgias. Negative for back pain and joint pain.  Neurological: Positive for dizziness (occasionally), sensory change (numbness on and off in hands and feet.) and focal weakness. Negative for tingling and headaches.  Psychiatric/Behavioral: Negative for depression. The patient  is nervous/anxious (has been having increase in anxiety recently). The patient does not have insomnia.       Past Medical History  Diagnosis Date  . Cardiac pacemaker     a. Since age 71 in 57. b. Upgrade to BiV in 2013.  . Congenital complete AV block   . Obesity   . GERD (gastroesophageal reflux disease)   . Asymptomatic LV dysfunction     a. Echo in Dec 2011 with EF 35 to 40%. Felt to be due to paced rhythm. b. EF 25-30% in 07/2014.  . Seizures ([place])     as a child- from high fever  . Anxiety   . Bipolar affective disorder ([place])   . Depression     bipolar  . Carpal tunnel syndrome of right wrist   . Asthma     seasonal allergies   . Arthritis     rheumatoid arthritis- mild, no rheumatology care   . Hypertension   . Pneumonitis     a. a/w hypoxia - inflammatory - large workup 07/2014.  [name] Sinus tachycardia ([place])   . Diabetes mellitus without complication ([place])   . Presence of permanent cardiac pacemaker   . CHF (congestive heart failure) ([place])   . Lupus ([place])   . Lupus (systemic lupus erythematosus) ([place])     Past Surgical History  Procedure Laterality Date  . Throat surgery  1994    s/p laser treatment  . Cesarean section    . Insert / replace / remove pacemaker      2001  . Cholecystectomy    . Iud removal  11/03/2011    Procedure:  INTRAUTERINE DEVICE (IUD) REMOVAL;  Surgeon: [name], MD;  Location: [facility];  Service: Gynecology;  Laterality: N/A;  . Cyst excision  12/10/2012    THYROID  . Thyroglossal duct cyst N/A 12/10/2012    Procedure: REVISION OF THYROGLOSSAL DUCT CYST EXCISION;  Surgeon: [name], MD;  Location: [facility];  Service: ENT;  Laterality: N/A;  Revision of Thyroglossal Duct Cyst Excision  . Nasal fracture surgery      /w plate   . Carpal tunnel with cubital tunnel Right 07/26/2013    Procedure: RIGHT LIMITED OPEN CARPAL TUNNEL RELEASE ,  RIGHT CUBITAL TUNNEL RELEASE, INSITU VERSES ULNAR NERVE DECOMPRESSION AND ANTERIOR TRANSPOSITION;   Surgeon: [name], MD;  Location: [facility];  Service: Orthopedics;  Laterality: Right;  . Video bronchoscopy Bilateral 06/19/2014    Procedure: VIDEO BRONCHOSCOPY WITHOUT FLUORO;  Surgeon: Brand Males, MD;  Location: [facility];  Service: Cardiopulmonary;  Laterality: Bilateral;  . Bi-ventricular pacemaker upgrade N/A 03/08/2012    Procedure: BI-VENTRICULAR PACEMAKER UPGRADE;  Surgeon: [name], MD;  Location: [facility] CATH LAB;  Service: Cardiovascular;  Laterality: N/A;  . Atrial tach ablation N/A 08/14/2014    Procedure: ATRIAL TACH ABLATION;  Surgeon: [name], MD;  Location: [facility] CATH LAB;  Service: Cardiovascular;  Laterality: N/A;  . Right heart catheterization N/A 10/26/2014    Procedure: RIGHT HEART CATH;  Surgeon: [name], MD;  Location: [facility] CATH LAB;  Service: Cardiovascular;  Laterality: N/A;    Family History  Problem Relation Age of Onset  . Heart disease Mother     CHF (no details)  . Hypertension Mother   . Heart disease Father     Murmur  . Heart disease Sister 63     No details.  History of a pacemaker  . Lupus Mother     Social History:  Lives with boyfriend and 55 year old son. Reports that she has been unable to walk for past 4 weeks and been bed bound. Per reports that she quit smoking about 19 years ago. Her smoking use included Cigarettes. She has a .125 pack-year smoking history. She has never used smokeless tobacco. She reports that she does not drink alcohol or use illicit drugs.    Allergies  Allergen Reactions  . Sertraline Hcl Hives  . Tape Other (See Comments)    Burns skin    Medications Prior to Admission  Medication Sig Dispense Refill  . albuterol (PROVENTIL HFA;VENTOLIN HFA) 108 (90 BASE) MCG/ACT inhaler Inhale 2 puffs into the lungs every 4 (four) hours as needed for wheezing or shortness of breath. 1 each 1  . atorvastatin (LIPITOR) 40 MG tablet Take 1 tablet (40 mg total) by mouth daily. 90 tablet 3  . bisoprolol (ZEBETA)  5 MG tablet Take 2.5 mg by mouth daily.    . budesonide-formoterol (SYMBICORT) 80-4.5 MCG/ACT inhaler Inhale 2 puffs into the lungs 2 (two) times daily as needed (for shortness of breath).     . calcium-vitamin D (OSCAL WITH D) 500-200 MG-UNIT per tablet Take 1 tablet by mouth daily with breakfast.     . cephALEXin (KEFLEX) 500 MG capsule Take 1 capsule (500 mg total) by mouth every 6 (six) hours. 36 capsule 0  . etonogestrel (NEXPLANON) 68 MG IMPL implant 1 each by Subdermal route once.    . fluocinonide (LIDEX) 0.05 % external solution 1 APPLICATION APPLY ON THE SKIN AS DIRECTED APPLY TO AFFECTED AREAS DAILY ON SCALP AS NEEDED FOR ITCHING.  5  .  furosemide (LASIX) 20 MG tablet Take 20 mg by mouth daily.  5  . Insulin Glargine (LANTUS SOLOSTAR) 100 UNIT/ML Solostar Pen Inject 35 Units into the skin daily at 10 pm. (Patient taking differently: Inject 35 Units into the skin daily as needed (low blood sugar). ) 15 mL 5  . metFORMIN (GLUCOPHAGE) 1000 MG tablet TAKE 1 TABLET BY MOUTH TWICE A DAY WITH A MEAL 30 tablet 11  . mycophenolate (CELLCEPT) 500 MG tablet Take 500 mg by mouth 2 (two) times daily.    . Nutritional Supplements (GLUCERNA ADVANCE SHAKE) LIQD Take supplement daily in place of 1 meal. (Patient taking differently: Take 1 Can by mouth daily as needed (meal replacement). Take supplement daily in place of 1 meal.) 30 Bottle 2  . oxyCODONE-acetaminophen (PERCOCET) 5-325 MG tablet Take 1 tablet by mouth every 6 (six) hours as needed for severe pain. 60 tablet 0  . potassium chloride SA (KLOR-CON M20) 20 MEQ tablet Take 1 tablet (20 mEq total) by mouth daily. 30 tablet 2  . Blood Glucose Monitoring Suppl (ONE TOUCH ULTRA 2) W/DEVICE KIT Check blood sugar daily before breakfast 1 each 0  . glucose blood (ONE TOUCH ULTRA TEST) test strip Check blood sugar daily before breakfast 100 each 0  . Insulin Pen Needle (INSUPEN PEN NEEDLES) 32G X 4 MM MISC Use with insulin pen 30 each 0  . predniSONE  (DELTASONE) 10 MG tablet Take 3 tablets for 5 days, then 2 tablets for 5 days, then return 1 tablet daily (Patient not taking: Reported on 12/18/2015) 90 tablet 0    Home: Home Living Family/patient expects to be discharged to:: Private residence Living Arrangements: Spouse/significant other Available Help at Discharge: Family, Available 24 hours/day (boyfriend available 24 hr/day) Type of Home: Apartment Home Access: Stairs to enter [employer] of Steps: 2 short steps Entrance Stairs-Rails: Left Home Layout: One level Bathroom Shower/Tub: Chiropodist: Standard Home Equipment: None   Functional History: Prior Function Level of Independence: Independent, Needs assistance Gait / Transfers Assistance Needed: pt states she needs assist to get out of tub at home, walks on her own typically but has needed boyfriend for all transfers the past 3 weeks ADL's / Homemaking Assistance Needed: pt has 5 y.o. son,boyfriend has been assisting with all ADLs the past few weeks, was independent before Communication / Swallowing Assistance Needed: no difficulties Comments: unless in a lupus flare pt is functionally independent with supplemental O2  Functional Status:  Mobility: Bed Mobility Overal bed mobility: Needs Assistance Bed Mobility: Sidelying to Sit, Supine to Sit, Sit to Supine Rolling: Min assist Sidelying to sit: Mod assist Supine to sit: Mod assist General bed mobility comments: pt pivoted with assit from bed rails, assist needed to bring LEs to EOBa and to bring trunk upright as pt fatigues quickly, benefits from rest breaks Transfers Overall transfer level: Needs assistance Equipment used: 2 person hand held assist Transfers: Sit to/from Stand, Stand Pivot Transfers Sit to Stand: Min assist, +2 physical assistance, +2 safety/equipment Stand pivot transfers: Min assist, +2 physical assistance, +2 safety/equipment General transfer comment: Mod A for  initial boost, pulling up on back of therapists arms, manual cues to facilitate hip extension, once stand took small pivot steps to chair with min A  for knee buckling and cues for slow controlled descent Ambulation/Gait General Gait Details: deferred due to increased in HR and fatigue    ADL: ADL Overall ADL's : Needs assistance/impaired Eating/Feeding: Modified independent, Bed level Grooming:  Wash/dry hands, Wash/dry face, Oral care, Set up, Sitting Upper Body Bathing: Supervision/ safety, Set up, Sitting Upper Body Dressing : Supervision/safety, Set up, Sitting Lower Body Dressing: Maximal assistance, +2 for physical assistance, +2 for safety/equipment, Sit to/from stand Toilet Transfer: Minimal assistance, +2 for physical assistance, +2 for safety/equipment, BSC Toilet Transfer Details (indicate cue type and reason): Pt performed SPT from EOB to 3:1 w/ +1 and Mod A today, if taking any steps, recommend +2 for physical assist and safety secondary to bilateral LE weakness. Fatigues easily Toileting- Clothing Manipulation and Hygiene: Maximal assistance, Sitting/lateral lean, Sit to/from stand Functional mobility during ADLs: Minimal assistance, +2 for physical assistance, +2 for safety/equipment, Cueing for safety, Cueing for sequencing General ADL Comments: Pt was educated in role of OT and then participated in ADL retraining session to consist of bed mobility, SPT to 3:1 for toileting and bathing/grooming. Pt will benefit from in-pt Rehab to assist in maximizing independence with ADL's and self care as well as functional transfers as she was independent until several weeks-1 month ago per her report. Pt also has 45 y/o son.  Cognition: Cognition Overall Cognitive Status: Within Functional Limits for tasks assessed Orientation Level: Oriented X4 Cognition Arousal/Alertness: Awake/alert Behavior During Therapy: Flat affect, WFL for tasks assessed/performed Overall Cognitive Status: Within  Functional Limits for tasks assessed   Blood pressure 97/58, pulse 85, temperature 97.4 F (36.3 C), temperature source Oral, resp. rate 18, height 5' 5"  (1.651 m), weight 77.61 kg (171 lb 1.6 oz), SpO2 95 %. Physical Exam  Nursing note and vitals reviewed. Constitutional: She is oriented to person, place, and time. She appears well-developed and well-nourished.  HENT:  Head: Normocephalic and atraumatic.  Mouth/Throat: Oropharynx is clear and moist. Dental caries present.  Eyes: Conjunctivae are normal. Pupils are equal, round, and reactive to light.  Neck: Normal range of motion. Neck supple.  Cardiovascular: Normal rate and regular rhythm.   Respiratory: Effort normal. No accessory muscle usage. No respiratory distress. She has decreased breath sounds in the right lower field.  GI: Soft. Bowel sounds are normal. She exhibits no distension. There is no tenderness.  Musculoskeletal: She exhibits no edema or tenderness.  Has bilateral thigh discomfort with ROM bilateral knee due to contractures. Sensation appears intact. Bilateral hands with evidence of muscle wasting.   Neurological: She is alert and oriented to person, place, and time.  Speech clear and able to follow basic commands without difficulty. Has basic understanding of her medical illness and has poor insight/awareness/concern regarding current medical issues.    Skin: Skin is warm and dry.  Psychiatric: She has a normal mood and affect. Her speech is normal and behavior is normal. Thought content normal. Her mood appears not anxious.    Results for orders placed or performed during the hospital encounter of 12/18/15 (from the past 48 hour(s))  CBC with Differential     Status: Abnormal   Collection Time: 12/18/15  2:45 PM  Result Value Ref Range   WBC 12.9 (H) 4.0 - 10.5 K/uL   RBC 3.85 (L) 3.87 - 5.11 MIL/uL   Hemoglobin 9.7 (L) 12.0 - 15.0 g/dL   HCT 32.1 (L) 36.0 - 46.0 %   MCV 83.4 78.0 - 100.0 fL   MCH 25.2 (L) 26.0  - 34.0 pg   MCHC 30.2 30.0 - 36.0 g/dL   RDW 18.5 (H) 11.5 - 15.5 %   Platelets 352 150 - 400 K/uL   Neutrophils Relative % 84 %   Lymphocytes  Relative 12 %   Monocytes Relative 4 %   Eosinophils Relative 0 %   Basophils Relative 0 %   Neutro Abs 10.9 (H) 1.7 - 7.7 K/uL   Lymphs Abs 1.5 0.7 - 4.0 K/uL   Monocytes Absolute 0.5 0.1 - 1.0 K/uL   Eosinophils Absolute 0.0 0.0 - 0.7 K/uL   Basophils Absolute 0.0 0.0 - 0.1 K/uL   RBC Morphology POLYCHROMASIA PRESENT   Basic metabolic panel     Status: Abnormal   Collection Time: 12/18/15  2:45 PM  Result Value Ref Range   Sodium 141 135 - 145 mmol/L   Potassium 3.4 (L) 3.5 - 5.1 mmol/L   Chloride 106 101 - 111 mmol/L   CO2 27 22 - 32 mmol/L   Glucose, Bld 97 65 - 99 mg/dL   BUN 9 6 - 20 mg/dL   Creatinine, Ser 0.57 0.44 - 1.00 mg/dL   Calcium 7.6 (L) 8.9 - 10.3 mg/dL   GFR calc non Af Amer >60 >60 mL/min   GFR calc Af Amer >60 >60 mL/min    Comment: (NOTE) The eGFR has been calculated using the CKD EPI equation. This calculation has not been validated in all clinical situations. eGFR's persistently <60 mL/min signify possible Chronic Kidney Disease.    Anion gap 8 5 - 15  Brain natriuretic peptide     Status: Abnormal   Collection Time: 12/18/15  2:45 PM  Result Value Ref Range   B Natriuretic Peptide 345.4 (H) 0.0 - 100.0 pg/mL  Urinalysis, Routine w reflex microscopic     Status: Abnormal   Collection Time: 12/18/15  4:19 PM  Result Value Ref Range   Color, Urine YELLOW YELLOW   APPearance CLOUDY (A) CLEAR   Specific Gravity, Urine 1.015 1.005 - 1.030   pH 7.0 5.0 - 8.0   Glucose, UA NEGATIVE NEGATIVE mg/dL   Hgb urine dipstick TRACE (A) NEGATIVE   Bilirubin Urine NEGATIVE NEGATIVE   Ketones, ur NEGATIVE NEGATIVE mg/dL   Protein, ur NEGATIVE NEGATIVE mg/dL   Nitrite NEGATIVE NEGATIVE   Leukocytes, UA NEGATIVE NEGATIVE  Urine culture     Status: None   Collection Time: 12/18/15  4:19 PM  Result Value Ref Range    Specimen Description URINE, CLEAN CATCH    Special Requests NONE    Culture      MULTIPLE SPECIES PRESENT, SUGGEST RECOLLECTION Performed at [facility]    Report Status 12/20/2015 FINAL   Urine microscopic-add on     Status: Abnormal   Collection Time: 12/18/15  4:19 PM  Result Value Ref Range   Squamous Epithelial / LPF 6-30 (A) NONE SEEN   WBC, UA 0-5 0 - 5 WBC/hpf   RBC / HPF 0-5 0 - 5 RBC/hpf   Bacteria, UA FEW (A) NONE SEEN  Troponin I     Status: Abnormal   Collection Time: 12/18/15  7:30 PM  Result Value Ref Range   Troponin I 0.05 (H) <0.031 ng/mL    Comment:        PERSISTENTLY INCREASED TROPONIN VALUES IN THE RANGE OF 0.04-0.49 ng/mL CAN BE SEEN IN:       -UNSTABLE ANGINA       -CONGESTIVE HEART FAILURE       -MYOCARDITIS       -CHEST TRAUMA       -ARRYHTHMIAS       -LATE PRESENTING MYOCARDIAL INFARCTION       -COPD   CLINICAL FOLLOW-UP RECOMMENDED.  Glucose, capillary     Status: None   Collection Time: 12/18/15  7:37 PM  Result Value Ref Range   Glucose-Capillary 73 65 - 99 mg/dL  Culture, blood (routine x 2)     Status: None (Preliminary result)   Collection Time: 12/18/15 10:44 PM  Result Value Ref Range   Specimen Description BLOOD LEFT HAND    Special Requests BOTTLES DRAWN AEROBIC AND ANAEROBIC 10CC     Culture NO GROWTH 1 DAY    Report Status PENDING   Lactic acid, plasma     Status: None   Collection Time: 12/18/15 11:40 PM  Result Value Ref Range   Lactic Acid, Venous 1.2 0.5 - 2.0 mmol/L  Culture, blood (routine x 2)     Status: None (Preliminary result)   Collection Time: 12/18/15 11:50 PM  Result Value Ref Range   Specimen Description BLOOD RIGHT HAND    Special Requests BOTTLES DRAWN AEROBIC ONLY 10CC    Culture NO GROWTH 1 DAY    Report Status PENDING   Troponin I     Status: Abnormal   Collection Time: 12/19/15  1:01 AM  Result Value Ref Range   Troponin I 0.05 (H) <0.031 ng/mL    Comment:        PERSISTENTLY INCREASED  TROPONIN VALUES IN THE RANGE OF 0.04-0.49 ng/mL CAN BE SEEN IN:       -UNSTABLE ANGINA       -CONGESTIVE HEART FAILURE       -MYOCARDITIS       -CHEST TRAUMA       -ARRYHTHMIAS       -LATE PRESENTING MYOCARDIAL INFARCTION       -COPD   CLINICAL FOLLOW-UP RECOMMENDED.   Lactic acid, plasma     Status: None   Collection Time: 12/19/15  1:01 AM  Result Value Ref Range   Lactic Acid, Venous 0.8 0.5 - 2.0 mmol/L  Vitamin B12     Status: None   Collection Time: 12/19/15  1:01 AM  Result Value Ref Range   Vitamin B-12 184 180 - 914 pg/mL    Comment: (NOTE) This assay is not validated for testing neonatal or myeloproliferative syndrome specimens for Vitamin B12 levels.   TSH     Status: Abnormal   Collection Time: 12/19/15  1:07 AM  Result Value Ref Range   TSH 5.581 (H) 0.350 - 4.500 uIU/mL  MRSA PCR Screening     Status: None   Collection Time: 12/19/15  2:55 AM  Result Value Ref Range   MRSA by PCR NEGATIVE NEGATIVE    Comment:        The GeneXpert MRSA Assay (FDA approved for NASAL specimens only), is one component of a comprehensive MRSA colonization surveillance program. It is not intended to diagnose MRSA infection nor to guide or monitor treatment for MRSA infections.   Troponin I     Status: None   Collection Time: 12/19/15  7:02 AM  Result Value Ref Range   Troponin I 0.03 <0.031 ng/mL    Comment:        NO INDICATION OF MYOCARDIAL INJURY.   Basic metabolic panel     Status: Abnormal   Collection Time: 12/19/15  7:02 AM  Result Value Ref Range   Sodium 137 135 - 145 mmol/L   Potassium 4.0 3.5 - 5.1 mmol/L   Chloride 100 (L) 101 - 111 mmol/L   CO2 26 22 - 32 mmol/L   Glucose, Bld 237 (H)  65 - 99 mg/dL   BUN 12 6 - 20 mg/dL   Creatinine, Ser 0.71 0.44 - 1.00 mg/dL   Calcium 7.6 (L) 8.9 - 10.3 mg/dL   GFR calc non Af Amer >60 >60 mL/min   GFR calc Af Amer >60 >60 mL/min    Comment: (NOTE) The eGFR has been calculated using the CKD EPI equation. This  calculation has not been validated in all clinical situations. eGFR's persistently <60 mL/min signify possible Chronic Kidney Disease.    Anion gap 11 5 - 15  CBC     Status: Abnormal   Collection Time: 12/19/15  7:02 AM  Result Value Ref Range   WBC 8.5 4.0 - 10.5 K/uL   RBC 3.61 (L) 3.87 - 5.11 MIL/uL   Hemoglobin 8.9 (L) 12.0 - 15.0 g/dL   HCT 29.2 (L) 36.0 - 46.0 %   MCV 80.9 78.0 - 100.0 fL   MCH 24.7 (L) 26.0 - 34.0 pg   MCHC 30.5 30.0 - 36.0 g/dL   RDW 18.4 (H) 11.5 - 15.5 %   Platelets 265 150 - 400 K/uL  Glucose, capillary     Status: Abnormal   Collection Time: 12/19/15  8:14 AM  Result Value Ref Range   Glucose-Capillary 179 (H) 65 - 99 mg/dL   Comment 1 Notify RN    Comment 2 Document in Chart   Glucose, capillary     Status: Abnormal   Collection Time: 12/19/15 12:05 PM  Result Value Ref Range   Glucose-Capillary 181 (H) 65 - 99 mg/dL   Comment 1 Notify RN    Comment 2 Document in Chart   Sedimentation rate     Status: Abnormal   Collection Time: 12/19/15  3:51 PM  Result Value Ref Range   Sed Rate 109 (H) 0 - 22 mm/hr  C3 complement     Status: None   Collection Time: 12/19/15  3:51 PM  Result Value Ref Range   C3 Complement 90 82 - 167 mg/dL    Comment: (NOTE) Performed At: [facility] [address] [place], [place] [id number] Lindon Romp MD [id number]   C4 complement     Status: None   Collection Time: 12/19/15  3:51 PM  Result Value Ref Range   Complement C4, Body Fluid 25 14 - 44 mg/dL    Comment: (NOTE) Performed At: [facility] [address] [place], [place] [id number] Lindon Romp MD PX:[id number]   Complement, total     Status: None   Collection Time: 12/19/15  3:51 PM  Result Value Ref Range   Compl, Total (CH50) 60 42 - 60 U/mL    Comment: (NOTE) Performed At: [facility] [place], [place] [id number] Lindon Romp MD FG:[id number]   Glucose, capillary     Status: Abnormal    Collection Time: 12/19/15  4:16 PM  Result Value Ref Range   Glucose-Capillary 222 (H) 65 - 99 mg/dL   Comment 1 Notify RN    Comment 2 Document in Chart   Glucose, capillary     Status: Abnormal   Collection Time: 12/19/15  9:18 PM  Result Value Ref Range   Glucose-Capillary 261 (H) 65 - 99 mg/dL  Glucose, capillary     Status: Abnormal   Collection Time: 12/20/15  8:01 AM  Result Value Ref Range   Glucose-Capillary 142 (H) 65 - 99 mg/dL  T4, free     Status: Abnormal   Collection Time: 12/20/15 11:30  AM  Result Value Ref Range   Free T4 1.34 (H) 0.61 - 1.12 ng/dL  Glucose, capillary     Status: Abnormal   Collection Time: 12/20/15 11:58 AM  Result Value Ref Range   Glucose-Capillary 283 (H) 65 - 99 mg/dL   Dg Chest 2 View  12/19/2015  CLINICAL DATA:  Recent admit for UTI EXAM: CHEST  2 VIEW COMPARISON:  12/18/2015 FINDINGS: Cardiac shadow is mildly enlarged. A pacing device is again seen. Patchy infiltrates are again noted throughout both lungs although some mild improved aeration is seen. The infiltrative changes are noted worst in the right lung base. No acute bony abnormality is noted. IMPRESSION: Slight improvement in bilateral infiltrates Electronically Signed   By: Inez Catalina M.D.   On: 12/19/2015 08:02   Dg Lumbar Spine Complete  12/18/2015  CLINICAL DATA:  Lower extremity weakness for 2 months. EXAM: LUMBAR SPINE - COMPLETE 4+ VIEW COMPARISON:  CT, 12/14/2015 FINDINGS: L5 is a transitional lumbosacral vertebrae. There is no fracture spondylolisthesis. There are no bone lesions. No degenerative changes. Mild straightening of the normal lumbar lordosis unchanged from the CT. Soft tissues are unremarkable. IMPRESSION: 1. No fracture or acute finding. 2. Mild straightening of the normal lumbar lordosis and transitional lumbosacral vertebrae. Otherwise unremarkable. Electronically Signed   By: Lajean Manes M.D.   On: 12/18/2015 23:44       Medical Problem List and Plan: 1.   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  DVT Prophylaxis/Anticoagulation: Pharmaceutical: Lovenox 3. Pain Management:  4. Mood: Team to provide ego support. LCSW to follow for evaluation and support.  5. Neuropsych: This patient is capable of making decisions on her own behalf. 6. Skin/Wound Care: Routine pressure relief measures. Maintain adequate nutrition and hydration.  7. Fluids/Electrolytes/Nutrition:  Monitor strict I/O. Check weights daily.  8. Pyelonephritis: to continue Keflex thorough 03/9 to complete antibiotic course.  9. 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  10 Acute on chronic systolic CHF:  Compensated--check daily weights. Continue lasix 20 mg/day. 11. Lupus: On Cellcept and stress dose steroids at this time.   12. DMT2: Resume lantus at 5 units/HS and titrate to home dose.  Monitor BS with ac/hs checks and use SSI for now. Anticipate BS to start trending up with increase in steroids. Will get dietician to educate patient on CM diet.  13. Bipolar disorder with panic attacks: Was on Abilify years ago and most recently has used  service dog to help manage symptoms.  14. Anemia of chronic disease?: Recheck CBC in am.    Post Admission Physician Evaluation: 1. 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 2. Patient is admitted to receive collaborative, interdisciplinary care between the physiatrist, rehab nursing staff, and therapy team. 3. Patient's level of medical complexity and substantial therapy needs in context of that medical necessity cannot be provided at a lesser intensity of care such as a SNF. 4. Patient has experienced substantial functional loss from his/her baseline which was documented above under the "Functional History" and "Functional Status" headings.  Judging by the patient's  diagnosis, physical exam, and functional history, the patient has potential for functional progress which will result in measurable gains while on inpatient rehab.  These gains will be of substantial and practical use upon discharge  in facilitating mobility and self-care at the household level. 5. Physiatrist will provide 24  hour management of medical needs as well as oversight of the therapy plan/treatment and provide guidance as appropriate regarding the interaction of the two. 6. 24 hour rehab nursing will assist with bladder management, bowel management, safety, skin/wound care, disease management, medication administration, pain management and patient education  and help integrate therapy concepts, techniques,education, etc. 7. PT will assess and treat for/with: pre gait, gait training, endurance , safety, equipment, neuromuscular re education.   Goals are: Sup/minA. 8. OT will assess and treat for/with: ADLs, Cognitive perceptual skills, Neuromuscular re education, safety, endurance, equipment.   Goals are: MinA. Therapy may proceed with showering this patient. 9. SLP will assess and treat for/with: NA.  Goals are: NA. 10. Case Management and Social Worker will assess and treat for psychological issues and discharge planning. 11. Team conference will be held weekly to assess progress toward goals and to determine barriers to discharge. 12. Patient will receive at least 3 hours of therapy per day at least 5 days per week. 13. ELOS: 20-25d      14. Prognosis:  good     Charlett Blake M.D. [facility] FAAPM&R (Sports Med, Neuromuscular Med) Diplomate [place] Board of Electrodiagnostic Med   12/20/2015

## 2015-12-20 NOTE — PMR Pre-admission (Signed)
PMR Admission Coordinator Pre-Admission Assessment  Patient: Makayla Vasquez is an 35 y.o., female MRN: [id number] DOB: 22-Dec-1980 Height: 5\' 5"  (165.1 cm) Weight: 77.61 kg (171 lb 1.6 oz)              Insurance Information HMO:     PPO:       PCP:       IPA:       80/20:       OTHER:   PRIMARY: Medicare A/B      Policy#: [id number]      Subscriber: Meriel Pica CM Name:        Phone#:       Fax#:   Pre-Cert#:        Employer:  Disabled/Not employed Benefits:  Phone #:       Name: Checked in [place]. Date: 05/20/06     Deduct: $1316      Out of Pocket Max: none      Life Max: unlimited CIR: 100%      SNF: 100 days Outpatient: 80%     Co-Pay: 20% Home Health: 100%      Co-Pay: none DME: 80%     Co-Pay: 20% Providers: patient's choice  SECONDARY: Medicaid [place] access      Policy#: [id number]      Subscriber: Meriel Pica CM Name:        Phone#:       Fax#:   Pre-Cert#:        Employer:  Not employed/Disabled Benefits:  Phone #: [phone]     Name: Automated Eff. Date: Eligible 12/20/15 with coverage code [id number]     Deduct:        Out of Pocket Max:        Life Max:   CIR:        SNF:   Outpatient:       Co-Pay:   Home Health:        Co-Pay:   DME:       Co-Pay:    Emergency Contact Information Contact Information    Name Relation Home Work Mobile   Hotz-Smith,Karen Sister [phone]     Bowman,Christopher Significant other [phone]       Current Medical History  Patient Admitting Diagnosis:  Debility  History of Present Illness:A 35 year old female with history of lupus, systolic CHF, chronic hypoxic respiratory failure--4 liters oxygen per [place], 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 She was placed on IV antibiotics and treated with gentle diuresis. Cardiac enzymes noted to be mildly  elevated due to demand ischemia and Dr. Tyson Alias consulted for input on dyspnea and recommended stress dose steroids to help with symptoms. Patient to continue on slow steroid taper and antibiotics narrowed to treat UTI/Pyel ad PCCM doubts HCAP. Respiratory status is improving but she continues to be limited by BLE weakness and fatigue. CIR recommended for follow up therapy.     Past Medical History  Past Medical History  Diagnosis Date  . Cardiac pacemaker     a. Since age 45 in 56. b. Upgrade to BiV in 2013.  . Congenital complete AV block   . Obesity   . GERD (gastroesophageal reflux disease)   . Asymptomatic LV dysfunction     a. Echo in Dec 2011 with EF 35  to 40%. Felt to be due to paced rhythm. b. EF 25-30% in 07/2014.  . Seizures (HCC)     as a child- from high fever  . Anxiety   . Bipolar affective disorder (HCC)   . Depression     bipolar  . Carpal tunnel syndrome of right wrist   . Asthma     seasonal allergies   . Arthritis     rheumatoid arthritis- mild, no rheumatology care   . Hypertension   . Pneumonitis     a. a/w hypoxia - inflammatory - large workup 07/2014.  [name] Sinus tachycardia (HCC)   . Diabetes mellitus without complication (HCC)   . Presence of permanent cardiac pacemaker   . CHF (congestive heart failure) (HCC)   . Lupus (HCC)   . Lupus (systemic lupus erythematosus) (HCC)     Family History  family history includes Heart disease in her father and mother; Heart disease (age of onset: 6) in her sister; Hypertension in her mother; Lupus in her mother.  Prior Rehab/Hospitalizations: Had outpatient therapy 3 months ago at [facility] per patient.  Has the patient had major surgery during 100 days prior to admission? No  Current Medications   Current facility-administered medications:  .  0.9 %  sodium chloride infusion, 250 mL, Intravenous, PRN, Palma Holter, MD .  acetaminophen (TYLENOL) tablet 650 mg, 650 mg, Oral, Q6H PRN **OR**  [DISCONTINUED] acetaminophen (TYLENOL) suppository 650 mg, 650 mg, Rectal, Q6H PRN, Palma Holter, MD .  albuterol (PROVENTIL) (2.5 MG/3ML) 0.083% nebulizer solution 2.5 mg, 2.5 mg, Inhalation, Q4H PRN, Palma Holter, MD .  cephALEXin (KEFLEX) capsule 500 mg, 500 mg, Oral, 4 times per day, Palma Holter, MD, 500 mg at 12/20/15 1216 .  enoxaparin (LOVENOX) injection 40 mg, 40 mg, Subcutaneous, Q24H, Palma Holter, MD, 40 mg at 12/19/15 1932 .  furosemide (LASIX) tablet 20 mg, 20 mg, Oral, Daily, Palma Holter, MD, 20 mg at 12/20/15 0813 .  insulin aspart (novoLOG) injection 0-15 Units, 0-15 Units, Subcutaneous, TID WC, Arvilla Market, DO, 8 Units at 12/20/15 1216 .  insulin aspart (novoLOG) injection 0-5 Units, 0-5 Units, Subcutaneous, QHS, Arvilla Market, DO .  mometasone-formoterol (DULERA) 100-5 MCG/ACT inhaler 2 puff, 2 puff, Inhalation, BID, Palma Holter, MD, 2 puff at 12/19/15 2015 .  mycophenolate (CELLCEPT) capsule 500 mg, 500 mg, Oral, BID, Palma Holter, MD, 500 mg at 12/20/15 3646 .  oxyCODONE-acetaminophen (PERCOCET/ROXICET) 5-325 MG per tablet 1 tablet, 1 tablet, Oral, Q6H PRN, Palma Holter, MD, 1 tablet at 12/20/15 [phone] .  polyethylene glycol (MIRALAX / GLYCOLAX) packet 17 g, 17 g, Oral, Daily PRN, Palma Holter, MD .  predniSONE (DELTASONE) tablet 30 mg, 30 mg, Oral, BID WC, Bernadene Person, NP .  sodium chloride flush (NS) 0.9 % injection 3 mL, 3 mL, Intravenous, Q12H, Palma Holter, MD, 3 mL at 12/19/15 1000 .  sodium chloride flush (NS) 0.9 % injection 3 mL, 3 mL, Intravenous, Q12H, Palma Holter, MD, 3 mL at 12/20/15 0815 .  sodium chloride flush (NS) 0.9 % injection 3 mL, 3 mL, Intravenous, PRN, Palma Holter, MD  Patients Current Diet: Diet heart healthy/carb modified Room service appropriate?: Yes; Fluid consistency:: Thin  Precautions / Restrictions Precautions Precautions:  Fall, ICD/Pacemaker Restrictions Weight Bearing Restrictions: No   Has the patient had 2 or more falls or a fall with injury in the past year?No.  Patient reports  1 fall with no injury.  Prior Activity Level Household: Homebound, went out 1-2 X a month  Journalist, newspaper / Equipment Home Assistive Devices/Equipment: Oxygen, Eyeglasses, Environmental consultant (specify type) Home Equipment: None  Prior Device Use: Indicate devices/aids used by the patient prior to current illness, exacerbation or injury? None of the above.  Patient used her boyfriend for assistance, but no device.  Prior Functional Level Prior Function Level of Independence: Independent, Needs assistance Gait / Transfers Assistance Needed: pt states she needs assist to get out of tub at home, walks on her own typically but has needed boyfriend for all transfers the past 3 weeks ADL's / Homemaking Assistance Needed: pt has 5 y.o. son,boyfriend has been assisting with all ADLs the past few weeks, was independent before Communication / Swallowing Assistance Needed: no difficulties Comments: unless in a lupus flare pt is functionally independent with supplemental O2  Self Care: Did the patient need help bathing, dressing, using the toilet or eating?  Needed some help  Indoor Mobility: Did the patient need assistance with walking from room to room (with or without device)? Needed some help  Stairs: Did the patient need assistance with internal or external stairs (with or without device)? Needed some help  Functional Cognition: Did the patient need help planning regular tasks such as shopping or remembering to take medications? Independent  Current Functional Level Cognition  Overall Cognitive Status: Within Functional Limits for tasks assessed Orientation Level: Oriented X4    Extremity Assessment (includes Sensation/Coordination)  Upper Extremity Assessment: Generalized weakness  Lower Extremity Assessment: Defer to PT  evaluation, Generalized weakness    ADLs  Overall ADL's : Needs assistance/impaired Eating/Feeding: Modified independent, Bed level Grooming: Wash/dry hands, Wash/dry face, Oral care, Set up, Sitting Upper Body Bathing: Supervision/ safety, Set up, Sitting Upper Body Dressing : Supervision/safety, Set up, Sitting Lower Body Dressing: Maximal assistance, +2 for physical assistance, +2 for safety/equipment, Sit to/from stand Toilet Transfer: Minimal assistance, +2 for physical assistance, +2 for safety/equipment, BSC Toilet Transfer Details (indicate cue type and reason): Pt performed SPT from EOB to 3:1 w/ +1 and Mod A today, if taking any steps, recommend +2 for physical assist and safety secondary to bilateral LE weakness. Fatigues easily Toileting- Clothing Manipulation and Hygiene: Maximal assistance, Sitting/lateral lean, Sit to/from stand Functional mobility during ADLs: Minimal assistance, +2 for physical assistance, +2 for safety/equipment, Cueing for safety, Cueing for sequencing General ADL Comments: Pt was educated in role of OT and then participated in ADL retraining session to consist of bed mobility, SPT to 3:1 for toileting and bathing/grooming. Pt will benefit from in-pt Rehab to assist in maximizing independence with ADL's and self care as well as functional transfers as she was independent until several weeks-1 month ago per her report. Pt also has 10 y/o son.    Mobility  Overal bed mobility: Needs Assistance Bed Mobility: Sidelying to Sit, Supine to Sit, Sit to Supine Rolling: Min assist Sidelying to sit: Mod assist Supine to sit: Mod assist General bed mobility comments: pt pivoted with assit from bed rails, assist needed to bring LEs to EOBa and to bring trunk upright as pt fatigues quickly, benefits from rest breaks    Transfers  Overall transfer level: Needs assistance Equipment used: 2 person hand held assist Transfers: Sit to/from Stand, Stand Pivot Transfers Sit to  Stand: Min assist, +2 physical assistance, +2 safety/equipment Stand pivot transfers: Min assist, +2 physical assistance, +2 safety/equipment General transfer comment: Mod A for initial boost, pulling  up on back of therapists arms, manual cues to facilitate hip extension, once stand took small pivot steps to chair with min A  for knee buckling and cues for slow controlled descent    Ambulation / Gait / Stairs / Wheelchair Mobility  Ambulation/Gait General Gait Details: deferred due to increased in HR and fatigue    Posture / Balance Balance Overall balance assessment: Needs assistance Sitting-balance support: Bilateral upper extremity supported, Feet supported Sitting balance-Leahy Scale: Fair Standing balance support: Bilateral upper extremity supported, During functional activity Standing balance-Leahy Scale: Poor    Special needs/care consideration BiPAP/CPAP No CPM No Continuous Drip IV No Dialysis No        Life Vest No Oxygen Yes, 02 4L/ Special Bed No Trach Size No Wound Vac (area) No       Skin No                             Bowel mgmt: Last BM 12/14/15 Bladder mgmt: Voiding WDL Diabetic mgmt Yes, on insulin and oral medications at home.    Previous Home Environment Living Arrangements: Spouse/significant other Available Help at Discharge: Family, Available 24 hours/day (boyfriend available 24 hr/day) Type of Home: Apartment Home Layout: One level Home Access: Stairs to enter Entrance Stairs-Rails: Left Entrance Stairs-Number of Steps: 2 short steps Bathroom Shower/Tub: Engineer, manufacturing systems: Standard Home Care Services: No  Discharge Living Setting Plans for Discharge Living Setting: Lives with (comment), Apartment (Lives with boyfriend and 5 yo son.) Type of Home at Discharge: Apartment Discharge Home Layout: One level Discharge Home Access: Stairs to enter Entrance Stairs-Number of Steps: 2 small steps Does the patient have any problems obtaining  your medications?: No  Social/Family/Support Systems Patient Roles: Parent, Other (Comment) (Has a boyfriend and a 84 yr old son.) Contact Information: Dalaney Needle - sister [phone] Anticipated Caregiver: Weston Settle - significant other Anticipated Caregiver's Contact Information: Leafy Half - [phone] Ability/Limitations of Caregiver: Boyfriend has been providing care for patient at home and can continue to assist Caregiver Availability: 24/7 Discharge Plan Discussed with Primary Caregiver: Yes Is Caregiver In Agreement with Plan?: Yes Does Caregiver/Family have Issues with Lodging/Transportation while Pt is in Rehab?: No  Goals/Additional Needs Patient/Family Goal for Rehab: PT/OT mod I and supervision goals Expected length of stay: 7-10 days Cultural Considerations: None Dietary Needs: Heart healthy, carb modified, thin liquids Equipment Needs: TBD Pt/Family Agrees to Admission and willing to participate: Yes Program Orientation Provided & Reviewed with Pt/Caregiver Including Roles  & Responsibilities: Yes   Decrease burden of Care through IP rehab admission: N/A  Possible need for SNF placement upon discharge: Not planned  Patient Condition: This patient's condition remains as documented in the consult dated 12/19/15, in which the Rehabilitation Physician determined and documented that the patient's condition is appropriate for intensive rehabilitative care in an inpatient rehabilitation facility. Will admit to inpatient rehab today.  Preadmission Screen Completed By:  Trish Mage, 12/20/2015 3:58 PM ______________________________________________________________________   Discussed status with Dr. Wynn Banker on 12/20/15 at 1558 and received telephone approval for admission today.  Admission Coordinator:  Trish Mage, time1558/Date03/02/17

## 2015-12-20 NOTE — Clinical Social Work Note (Signed)
Clinical Social Work Assessment  Patient Details  Name: LOWELLA KINDLEY MRN: [id number] Date of Birth: January 15, 1981  Date of referral:  12/20/15               Reason for consult:  Facility Placement                Permission sought to share information with:  Facility Sport and exercise psychologist, Family Supports Permission granted to share information::  Yes, Verbal Permission Granted  Name::     Santiago Glad  Agency::  [facility]  Relationship::  sister  Contact Information:  [phone]  Housing/Transportation Living arrangements for the past 2 months:  Single Family Home Source of Information:  Patient Patient Interpreter Needed:  None Criminal Activity/Legal Involvement Pertinent to Current Situation/Hospitalization:  No - Comment as needed Significant Relationships:  Significant Other, Parents, Siblings, Dependent Children Lives with:  Significant Other Do you feel safe going back to the place where you live?    Need for family participation in patient care:  Yes (Comment)  Care giving concerns:  CSW received referral for possible SNF placement at time of discharge. CSW met with patient and patient's significant other at bedside regarding PT recommendation of SNF placement at time of discharge. Patient reports being unable to care for herself at their home given patient's current physical needs and fall risk. Patient expressed understanding of PT recommendation and is agreeable to SNF placement at time of discharge if CIR is unable to admit. CSW to continue to follow and assist with discharge planning needs.   Social Worker assessment / plan:  CSW spoke with patient concerning possibility of rehab at [facility] before returning home if CIR is unable to admit.  Employment status:  Disabled (Comment on whether or not currently receiving Disability) Insurance information:  Medicare PT Recommendations:  [place], [place] / Referral to community  resources:  [place]  Patient/Family's Response to care: Patient recognizse need for rehab before returning home and is agreeable to a SNF in [place]. Patient reported preference for [place].  Patient/Family's Understanding of and Emotional Response to Diagnosis, Current Treatment, and Prognosis:  Patient is realistic regarding therapy needs. No questions/concerns about plan or treatment.    Emotional Assessment Appearance:  Appears stated age Attitude/Demeanor/Rapport:   (Appropriate) Affect (typically observed):  Accepting, Appropriate Orientation:  Oriented to Self, Oriented to  Time, Oriented to Place, Oriented to Situation Alcohol / Substance use:  Not Applicable Psych involvement (Current and /or in the community):  No (Comment)  Discharge Needs  Concerns to be addressed:  Care Coordination Readmission within the last 30 days:  Yes Current discharge risk:  None Barriers to Discharge:  Continued Medical Work up   [employer], [place] 12/20/2015, 1:31 PM

## 2015-12-20 NOTE — Evaluation (Signed)
Occupational Therapy Evaluation Patient Details Name: Makayla Vasquez MRN: [id number] DOB: [birth year] Today's Date: 12/20/2015    History of Present Illness Makayla Vasquez is a 35 y.o. female presenting with LE weakness and pain, urinary incontinence, and shortness of breath. PMH is significant for HFrEF (EF 40-45%), congenital AV block with pace maker, Lupus on chronic prednisone and immunosuppressive therapy.    Clinical Impression   Pt admitted as above currently presenting with deficits in her ability to perform ADL/self care tasks (see OT problem list below) whom should benefit from acute OT followed by In-pt Rehab to assist in maximizing independence with self care tasks and decreased burden of care.    Follow Up Recommendations  CIR;Supervision/Assistance - 24 hour    Equipment Recommendations  Other (comment) (Defer to next venue)    Recommendations for Other Services       Precautions / Restrictions Precautions Precautions: Fall;ICD/Pacemaker Restrictions Weight Bearing Restrictions: No      Mobility Bed Mobility Overal bed mobility: Needs Assistance Bed Mobility: Sidelying to Sit;Supine to Sit;Sit to Supine Rolling: Min assist Sidelying to sit: Mod assist Supine to sit: Mod assist     General bed mobility comments: pt pivoted with assit from bed rails, assist needed to bring LEs to EOBa and to bring trunk upright as pt fatigues quickly, benefits from rest breaks  Transfers Overall transfer level: Needs assistance Equipment used: 2 person hand held assist Transfers: Sit to/from Stand;Stand Pivot Transfers Sit to Stand: Min assist;+2 physical assistance;+2 safety/equipment Stand pivot transfers: Min assist;+2 physical assistance;+2 safety/equipment            Balance Overall balance assessment: Needs assistance Sitting-balance support: Bilateral upper extremity supported;Feet supported Sitting balance-Leahy Scale: Fair     Standing balance support:  Bilateral upper extremity supported;During functional activity Standing balance-Leahy Scale: Poor                              ADL Overall ADL's : Needs assistance/impaired Eating/Feeding: Modified independent;Bed level   Grooming: Wash/dry hands;Wash/dry face;Oral care;Set up;Sitting   Upper Body Bathing: Supervision/ safety;Set up;Sitting       Upper Body Dressing : Supervision/safety;Set up;Sitting   Lower Body Dressing: Maximal assistance;+2 for physical assistance;+2 for safety/equipment;Sit to/from stand   Toilet Transfer: Minimal assistance;+2 for physical assistance;+2 for safety/equipment;BSC Toilet Transfer Details (indicate cue type and reason): Pt performed SPT from EOB to 3:1 w/ +1 and Mod A today, if taking any steps, recommend +2 for physical assist and safety secondary to bilateral LE weakness. Fatigues easily Toileting- Clothing Manipulation and Hygiene: Maximal assistance;Sitting/lateral lean;Sit to/from stand       Functional mobility during ADLs: Minimal assistance;+2 for physical assistance;+2 for safety/equipment;Cueing for safety;Cueing for sequencing General ADL Comments: Pt was educated in role of OT and then participated in ADL retraining session to consist of bed mobility, SPT to 3:1 for toileting and bathing/grooming. Pt will benefit from in-pt Rehab to assist in maximizing independence with ADL's and self care as well as functional transfers as she was independent until several weeks-1 month ago per her report. Pt also has 30 y/o son.     Vision  Wear glasses at all times. No change from baseline. Pt states glasses are at home - does not have them here.   Perception     Praxis      Pertinent Vitals/Pain Pain Score: 3  Pain Location: Bilateral LE's Pain Descriptors / Indicators: Cramping;Tightness Pain  Intervention(s): Limited activity within patient's tolerance;Monitored during session;Repositioned;Patient requesting pain meds-RN notified      Hand Dominance Right   Extremity/Trunk Assessment Upper Extremity Assessment Upper Extremity Assessment: Generalized weakness   Lower Extremity Assessment Lower Extremity Assessment: Defer to PT evaluation;Generalized weakness   Cervical / Trunk Assessment Cervical / Trunk Assessment: Normal   Communication Communication Communication: No difficulties   Cognition Arousal/Alertness: Awake/alert Behavior During Therapy: Flat affect;WFL for tasks assessed/performed Overall Cognitive Status: Within Functional Limits for tasks assessed                     General Comments       Exercises       Shoulder Instructions      Home Living Family/patient expects to be discharged to:: Private residence Living Arrangements: Spouse/significant other Available Help at Discharge: Family;Available 24 hours/day (boyfriend available 24 hr/day) Type of Home: Apartment Home Access: Stairs to enter Entrance Stairs-Number of Steps: 2 short steps Entrance Stairs-Rails: Left Home Layout: One level     Bathroom Shower/Tub: Chief Strategy Officer: Standard                Prior Functioning/Environment Level of Independence: Independent;Needs assistance  Gait / Transfers Assistance Needed: pt states she needs assist to get out of tub at home, walks on her own typically but has needed boyfriend for all transfers the past 3 weeks ADL's / Homemaking Assistance Needed: pt has 5 y.o. son,boyfriend has been assisting with all ADLs the past few weeks, was independent before Communication / Swallowing Assistance Needed: no difficulties Comments: unless in a lupus flare pt is functionally independent with supplemental O2    OT Diagnosis: Generalized weakness;Acute pain   OT Problem List: Decreased strength;Decreased coordination;Pain;Cardiopulmonary status limiting activity;Decreased activity tolerance;Impaired balance (sitting and/or standing);Decreased knowledge of use  of DME or AE;Decreased knowledge of precautions   OT Treatment/Interventions: Self-care/ADL training;Therapeutic exercise;Energy conservation;DME and/or AE instruction;Patient/family education;Therapeutic activities;Balance training    OT Goals(Current goals can be found in the care plan section) Acute Rehab OT Goals Patient Stated Goal:  Do things for myself again Time For Goal Achievement: 01/10/16 Potential to Achieve Goals: Good ADL Goals Pt Will Perform Lower Body Bathing: with modified independence;with adaptive equipment;sitting/lateral leans;sit to/from stand Pt Will Perform Lower Body Dressing: with modified independence;with adaptive equipment;sitting/lateral leans;sit to/from stand Pt Will Transfer to Toilet: with supervision;ambulating;bedside commode Pt Will Perform Toileting - Clothing Manipulation and hygiene: with supervision;sitting/lateral leans;sit to/from stand Additional ADL Goal #1: Pt will stand at supervision level 3-5 min in preparation for increased participation with ADL's and balance training  OT Frequency: Min 2X/week   Barriers to D/C:            Co-evaluation              End of Session Equipment Utilized During Treatment: Gait belt;Oxygen (2L O2 throughout session) Nurse Communication: Mobility status;Other (comment) (ADL's grooming completed)  Activity Tolerance: Patient limited by fatigue Patient left: in bed;with call bell/phone within reach;with bed alarm set   Time: 0815-0850 OT Time Calculation (min): 35 min Charges:  OT General Charges $OT Visit: 1 Procedure OT Evaluation $OT Eval Moderate Complexity: 1 Procedure OT Treatments $Self Care/Home Management : 8-22 mins G-Codes:    Roselie Awkward Dixon, OTR/L 12/20/2015, 9:08 AM

## 2015-12-20 NOTE — Discharge Summary (Signed)
[facility] Discharge Summary  Patient name: Makayla Vasquez Medical record number: [id number] Date of birth: [birth year] Age: 35 y.o. Gender: female Date of Admission: 12/18/2015  Date of Discharge: 12/21/2015 Admitting Physician: Makayla La, MD  Primary Care Provider: Nobie Putnam, DO Consultants: Pulmonology   Indication for Hospitalization: LE weakness and SOB   Discharge Diagnoses/Problem List:  Patient Active Problem List   Diagnosis Date Noted  . Pyelonephritis   . Acute on chronic systolic CHF (congestive heart failure) ([place])   . Type 2 diabetes mellitus with complication, with long-term current use of insulin ([place])   . Anemia of chronic disease   . Bipolar affective disorder in remission ([place])   . Debility 12/20/2015  . Acute respiratory failure ([place])   . Generalized weakness   . Lower extremity weakness   . Acute on chronic congestive heart failure ([place])   . AV block   . Acute blood loss anemia   . Supplemental oxygen dependent   . Tachypnea   . Critical illness neuropathy ([place])   . Steroid myopathy   . HCAP (healthcare-associated pneumonia) 12/18/2015  . Acute pyelonephritis   . Palpitations   . Complete heart block ([place])   . Cardiac pacemaker in situ   . UTI (urinary tract infection) with pyuria   . Diarrhea 12/14/2015  . Viral gastroenteritis 12/14/2015  . Lower limb pain, anterior   . Calf pain   . Adrenal insufficiency ([place])   . Acute on chronic diastolic CHF (congestive heart failure) ([place])   . Arterial hypotension   . SOB (shortness of breath)   . Lupus (systemic lupus erythematosus) ([place])   . Congenital heart block   . Chronic diastolic congestive heart failure ([place])   . Hypotension 07/08/2015  . Fatigue 06/05/2015  . Nausea with vomiting 06/05/2015  . Hyperlipidemia associated with type 2 diabetes mellitus ([place]) 05/04/2015  . Polyarthralgia 04/26/2015  . Rhinosinusitis 04/19/2015  . Screening for cervical  cancer 01/19/2015  . Onychomycosis of right great toe 12/14/2014  . Discoloration of skin 12/01/2014  . Lip abscess 11/02/2014  . CHF (congestive heart failure) ([place])   . Diabetes mellitus type 2 in obese ([place])   . Hyperglycemia 10/24/2014  . Dehydration   . Sinus tachycardia ([place])   . Chronic respiratory failure with hypoxia ([place]) 08/07/2014  . Atrial tachycardia ([place]) 07/26/2014  . Insertion of implantable subdermal contraceptive 07/12/2014  . Acute respiratory failure with hypoxia ([place]) 06/19/2014  . ILD (interstitial lung disease) ([place]) 06/19/2014  . Pulmonary infiltrates 06/17/2014  . Dyspnea 06/16/2014  . Hypoxemia 06/16/2014  . Congestive dilated cardiomyopathy ([place]) 05/31/2014  . Nonallergic rhinitis 05/22/2014  . Vaginal itching 05/22/2014  . Pneumonia 05/09/2014  . Shortness of breath 05/08/2014  . Sterilization consult 04/20/2014  . Breast tenderness 01/18/2014  . Carpal tunnel syndrome 09/10/2012  . Constipation 03/03/2012  . Chronic systolic heart failure ([place]) 02/26/2012  . Chest pain, unspecified 12/29/2011  . Post-nasal drip 10/24/2011  . Contraception 01/29/2011  . Heart palpitations   . PPM-Medtronic   . Congenital complete AV block   . Bipolar affective disorder ([place])   . Obesity   . GERD (gastroesophageal reflux disease)   . Hypertension 01/05/2011  . THYROGLOSSAL DUCT CYST 07/18/2010  . UNSPECIFIED CARDIAC DYSRHYTHMIA 05/07/2010  . RH FACTOR, NEGATIVE 04/08/2010  . Mild intermittent asthma without complication 24/23/5361  . 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 HEART BLOCK 12/17/2006  . IRRITABLE BOWEL SYNDROME 12/17/2006    Disposition: Inpatient  Rehab  Discharge Condition: Stable   Discharge Exam: Please See Last Progress Note   Brief Hospital Course:  Makayla Vasquez is 35 y.o. female with PMH of HFrEF, congenital AV block pacemaker, lupus on chronic prednisone who was recently discharge after hospital stay for pyelonephritis. She returned 1 day  after discharge with complaint of 2 weeks of LE weakness (which had not been mentioned at previous hospital stay), urinary incontinence and SOB.   Leg Weakness:  Patient's exam was inconsistent and weakness was located more in proximal large muscle groups of LE. A lumbar xray was obtained for concern of possible compression fracture given chronic steroid use. Xray was negative. Patient reported a couple episodes of urinary incontinence. These were not associated with saddle anesthesia or bowel incontinence. PT evaluated patient and felt that she would be a good candidate for inpatient rehab placement. 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   Dyspnea:  Patient with known interstitial lung disease on 4L O2 at home. CXR obtained at admission which was significant for bilateral interstitial and alveolar airspace opacities which may reflect pulmonary edema vs. Multilobar PNA. Given concern for possible HCAP, patient was started on Vancomycin and Cefepime. She was also given IV lasix x1 for known CHF with possible pulmonary edema. Repeat CXR showed improvement in bilateral infiltrates. Suspicion for PNA was low, as patient was afebrile, without leukocytosis, no additional O2 requirement and with an improving CXR. Given this, Vancomycin and Cefepime were discontinued. Pulmonology was consulted for further workup and recommendations regarding her chronic lung disease. Pulmonology recommended increasing steroid dose with a slow taper back to maintenance dose of 10 mg/day. They also recommended outpatient rheumatology follow up and getting patient more back to baseline prior to considering lung biopsy.   Issues for Follow Up:  1. Patient did not start Keflex for pyelonephritis after discharge. This course was resumed during current hospitalization. She should take Keflex through 3/9.  2. Pulmonary recommended slow taper of  prednisone from 60 mg back to baseline dose of 10 mg over 4 weeks.   3. TSH was mildly elevated at 5.581. Free T3 was WNL and Free T4 was mildly elevated at 1.34. Would recommend follow up outpatient.   Significant Procedures: None   Significant Labs and Imaging:   Recent Labs Lab 12/17/15 0916 12/18/15 1445 12/19/15 0702  WBC 9.8 12.9* 8.5  HGB 9.6* 9.7* 8.9*  HCT 33.5* 32.1* 29.2*  PLT 397 352 265    Recent Labs Lab 12/15/15 1636 12/17/15 0916 12/18/15 1445 12/19/15 0702  NA 140 143 141 137  K 4.0 3.7 3.4* 4.0  CL 109 110 106 100*  CO2 19* 24 27 26   GLUCOSE 218* 112* 97 237*  BUN 8 8 9 12   CREATININE 0.59 0.60 0.57 0.71  CALCIUM 7.7* 8.6* 7.6* 7.6*  ALKPHOS 55  --   --   --   AST 42*  --   --   --   ALT 22  --   --   --   ALBUMIN 1.6*  --   --   --     Dg Chest 2 View  12/19/2015  CLINICAL DATA:  Recent admit for UTI EXAM: CHEST  2 VIEW COMPARISON:  12/18/2015 FINDINGS: Cardiac shadow is mildly enlarged. A pacing device is again seen. Patchy infiltrates are again noted throughout both lungs although some mild improved aeration is seen. The infiltrative changes are noted worst in  the right lung base. No acute bony abnormality is noted. IMPRESSION: Slight improvement in bilateral infiltrates Electronically Signed   By: Inez Catalina M.D.   On: 12/19/2015 08:02   Dg Chest 2 View  12/18/2015  CLINICAL DATA:  Cough, lupus, history of asthma EXAM: CHEST  2 VIEW COMPARISON:  CT chest 12/10/2015 FINDINGS: There are bilateral interstitial and alveolar airspace opacities. There are bilateral small pleural effusions. There is no pneumothorax. The heart and mediastinal contours are unremarkable. There is a cardiac pacemaker. The osseous structures are unremarkable. IMPRESSION: Bilateral interstitial and alveolar airspace opacities which may reflect pulmonary edema versus multi lobar pneumonia. Electronically Signed   By: Kathreen Devoid   On: 12/18/2015 14:07   Dg Chest 2  View  12/09/2015  CLINICAL DATA:  Shortness of breath, cough, chest congestion and CHF. Initial encounter. EXAM: CHEST  2 VIEW COMPARISON:  Most recent chest imaging radiographs 10/05/2015, CT 07/08/2015 reviewed. FINDINGS: Left-sided pacemaker remains in place. Cardiomegaly, with mild improvement from prior. Persistent but improved central pulmonary edema. No consolidation to suggest pneumonia. No pleural effusion or pneumothorax. Osseous structures are unchanged. IMPRESSION: Mild CHF which appears chronic, with improvement compared to prior exam. Electronically Signed   By: Jeb Levering M.D.   On: 12/09/2015 23:08   Dg Lumbar Spine Complete  12/18/2015  CLINICAL DATA:  Lower extremity weakness for 2 months. EXAM: LUMBAR SPINE - COMPLETE 4+ VIEW COMPARISON:  CT, 12/14/2015 FINDINGS: L5 is a transitional lumbosacral vertebrae. There is no fracture spondylolisthesis. There are no bone lesions. No degenerative changes. Mild straightening of the normal lumbar lordosis unchanged from the CT. Soft tissues are unremarkable. IMPRESSION: 1. No fracture or acute finding. 2. Mild straightening of the normal lumbar lordosis and transitional lumbosacral vertebrae. Otherwise unremarkable. Electronically Signed   By: Lajean Manes M.D.   On: 12/18/2015 23:44   Ct Angio Chest Pe W/cm &/or Wo Cm  12/10/2015  CLINICAL DATA:  Initial evaluation for acute shortness of breath. History of lupus. EXAM: CT ANGIOGRAPHY CHEST WITH CONTRAST TECHNIQUE: Multidetector CT imaging of the chest was performed using the standard protocol during bolus administration of intravenous contrast. Multiplanar CT image reconstructions and MIPs were obtained to evaluate the vascular anatomy. CONTRAST:  56m OMNIPAQUE IOHEXOL 350 MG/ML SOLN COMPARISON:  Prior radiograph from 12/09/2015. FINDINGS: Visualized thyroid gland normal. No pathologically enlarged mediastinal or axillary adenopathy. Mild soft tissue fullness within the bilateral hila similar  to previous, which may related to lymphoid tissue. Intrathoracic aorta of normal caliber and appearance. Great vessels within normal limits. Cardiomegaly with noted. Trace pericardial effusion. Left-sided pacemaker in place. Evaluation of the pulmonary arteries somewhat limited by body habitus and motion artifact. Main pulmonary artery measures within normal limits at 2.2 cm in diameter. No central or segmental filling defect to suggest acute pulmonary embolus identified. Re-formatted imaging confirms these findings. Evaluation for small distal emboli sub 1 limited on this examination. Diffuse severe ground-glass attenuation, relatively bilateral and symmetric, with predominance in the mid and lower lungs. Air bronchograms extend from the perihilar regions to the periphery. More nodular densities within the bilateral upper lungs. Possible trace bilateral pleural effusions. These findings are relatively similar relative to most recent CT from 07/08/2015. Visualized upper abdomen demonstrates no acute abnormality. Possible hepatic steatosis. No acute osseous abnormality. No worrisome lytic or blastic osseous lesions. IMPRESSION: 1. No CT evidence for acute pulmonary embolism. Please note that evaluation for possible small -distal pulmonary emboli somewhat limited due to body habitus and extensive  bilateral consolidation within the lungs. 2. Severe bilaterally symmetric basilar predominant ground-glass consolidation with air bronchograms, overall similar in appearance relative to most recent CT from 07/08/2015. Possible underlying chronic lung disease related to interstitial pneumonitis or possibly history of lupus/autoimmune disorder could be considered. 3. Cardiomegaly without definite evidence for pulmonary edema. Electronically Signed   By: Jeannine Boga M.D.   On: 12/10/2015 02:23   Ct Abdomen Pelvis W Contrast  12/14/2015  CLINICAL DATA:  Initial evaluation for generalized abdominal pain. EXAM: CT  ABDOMEN AND PELVIS WITH CONTRAST TECHNIQUE: Multidetector CT imaging of the abdomen and pelvis was performed using the standard protocol following bolus administration of intravenous contrast. CONTRAST:  172m OMNIPAQUE IOHEXOL 300 MG/ML  SOLN COMPARISON:  Prior CT from 02/25/2013. FINDINGS: There are extensive ground-glass opacities with air bronchograms throughout the partially visualized lungs, highly suspicious for possible infection. A component of pulmonary edema may be present as well, although no significant pleural effusion identified. Pacemaker electrodes partially visualized. No pericardial effusion. Liver demonstrates a normal contrast enhanced appearance. Gallbladder absent. No biliary dilatation. Spleen, adrenal glands, and pancreas demonstrate a normal contrast enhanced appearance. Kidneys are equal in size with symmetric enhancement. No nephrolithiasis or hydronephrosis. No focal enhancing renal mass. Stomach within normal limits. No evidence for bowel obstruction. No wall thickening, mucosal enhancement, or inflammatory fat stranding seen about the bowels. Appendix well visualized in the right abdomen and is of normal caliber and appearance without associated inflammatory changes to suggest acute appendicitis. Fluid level present within the lumen of the distal colon/rectum, which may reflect active diarrheal illness. Bladder partially distended but grossly unremarkable. Uterus within normal limits. Ovaries normal for patient age. No free air or fluid. No pathologically enlarged intra-abdominal pelvic lymph nodes. Normal intravascular enhancement seen throughout the intra-abdominal aorta and its branch vessels. No acute osseous abnormality. No worrisome lytic or blastic osseous lesions. 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 IMPRESSION: 1. Fluid level within the lumen of the distal colon, suggesting possible active diarrheal illness. No CT evidence for other acute intra-abdominal or pelvic process. 2.  Extensive ground-glass opacities seen throughout the partially visualized lung bases. Findings may reflect sequela of infection or possibly edema. 3. Status post cholecystectomy. Electronically Signed   By: BJeannine BogaM.D.   On: 12/14/2015 06:21    Results/Tests Pending at Time of Discharge: None   Discharge Medications:    Medication List    TAKE these medications        albuterol 108 (90 Base) MCG/ACT inhaler  Commonly known as:  PROVENTIL HFA;VENTOLIN HFA  Inhale 2 puffs into the lungs every 4 (four) hours as needed for wheezing or shortness of breath.     atorvastatin 40 MG tablet  Commonly known as:  LIPITOR  Take 1 tablet (40 mg total) by mouth daily.     bisoprolol 5 MG tablet  Commonly known as:  ZEBETA  Take 2.5 mg by mouth daily.     budesonide-formoterol 80-4.5 MCG/ACT inhaler  Commonly known as:  SYMBICORT  Inhale 2 puffs into the lungs 2 (two) times daily as needed (for shortness of breath).     calcium-vitamin D 500-200 MG-UNIT tablet  Commonly known as:  OSCAL WITH D  Take 1 tablet by mouth daily with breakfast.     cephALEXin 500 MG capsule  Commonly known as:  KEFLEX  Take 1 capsule (500 mg total) by mouth every 6 (six) hours.     fluocinonide 0.05 % external solution  Commonly known as:  LIDEX  1 APPLICATION APPLY ON THE SKIN AS DIRECTED APPLY TO AFFECTED AREAS DAILY ON SCALP AS NEEDED FOR ITCHING.     furosemide 20 MG tablet  Commonly known as:  LASIX  Take 20 mg by mouth daily.     GLUCERNA ADVANCE SHAKE Liqd  Take supplement daily in place of 1 meal.     glucose blood test strip  Commonly known as:  ONE TOUCH ULTRA TEST  Check blood sugar daily before breakfast     Insulin Glargine 100 UNIT/ML Solostar Pen  Commonly known as:  LANTUS SOLOSTAR  Inject 35 Units into the skin daily at 10 pm.     Insulin Pen Needle 32G X 4 MM Misc  Commonly known as:  INSUPEN PEN NEEDLES  Use with insulin pen     metFORMIN 1000 MG tablet  Commonly  known as:  GLUCOPHAGE  TAKE 1 TABLET BY MOUTH TWICE A DAY WITH A MEAL     mycophenolate 500 MG tablet  Commonly known as:  CELLCEPT  Take 500 mg by mouth 2 (two) times daily.     NEXPLANON 68 MG Impl implant  Generic drug:  etonogestrel  1 each by Subdermal route once.     ONE TOUCH ULTRA 2 w/Device Kit  Check blood sugar daily before breakfast     oxyCODONE-acetaminophen 5-325 MG tablet  Commonly known as:  PERCOCET  Take 1 tablet by mouth every 6 (six) hours as needed for severe pain.     potassium chloride SA 20 MEQ tablet  Commonly known as:  KLOR-CON M20  Take 1 tablet (20 mEq total) by mouth daily.     predniSONE 10 MG tablet  Commonly known as:  DELTASONE  Take 6 tabs for 5 days, then 5 tabs for 5 days, then 4 tabs for 5 days, then 3 tabs for 5 days, then 2 tabs for 5 days, then 1 tab daily.        Discharge Instructions: Please refer to Patient Instructions section of EMR for full details.  Patient was counseled important signs and symptoms that should prompt return to medical care, changes in medications, dietary instructions, activity restrictions, and follow up appointments.   Follow-Up Appointments: Follow-up Information    Follow up with Deneise Lever, MD On 12/24/2015.   Specialty:  Pulmonary Disease   Why:  2:45pm    Contact information:   [place] [address] [phone]       Catherine Lauren Wallace, DO 12/21/2015, 9:06 PM PGY-1, [place]

## 2015-12-20 NOTE — Progress Notes (Signed)
Family Medicine Teaching Service Daily Progress Note Intern Pager: [phone]  Patient name: Makayla Vasquez Medical record number: [id number] Date of birth: 25-Jul-1981 Age: 35 y.o. Gender: female  Primary Care Provider: Saralyn Pilar, DO Consultants: None  Code Status: FULL   Pt Overview and Major Events to Date:  2/28: Patient admitted to FPTS   Assessment and Plan: Makayla Vasquez is a 35 y.o. female presenting with LE weakness and pain, urinary incontinence, and shortness of breath. PMH is significant for HFrEF (EF 40-45%), congenital AV block with pace maker, Lupus on chronic prednisone and immunosuppressive therapy.   LE Weakness/Pain: unclear in etiology. Possibly acute worsening of chronic issue. Notes of urinary incontinence, but no bowel incontinence. No sensory deficits. No back tenderness or back pain.  - admit to teaching service, step down unit, attending Dr. Denny Levy  - x-ray lumbar spine unremarkable for fracture or an acute finding - Percocet PRN pain  - PT/OT eval >> recommending CIR placement; CIR ? Placement pending  - TSH elevated to 5.581, T3/T4 ordered  -B12 on low side of normal at 184   SOB:  Concern for HCAP at admission given bilateral interstitial and alveolar airspace opacities on CXR which may reflect pulmonary edema vs. Multilobar PNA.Repeat CRX with mild improvement in bilateral infiltrates. Mildly elevated WBC at 12.9 but has been on Prednisone, repeat WBC WNL at 8.5. No fever. Lung exam is unremarkable. 92% on home 4L. S/p Vanc and Cefepime at [facility] ED. Lactic acid normal at 1.2 Concern for possible worsening chronic interstitial lung disease vs. New primary problem. Vancomycin and Cefepime discontinued 3/1 for low suspicion for HCAP.  - blood cultures pending (obtained after abx)  - continuous pulse ox - supplemental O2 to keep O2 >92% (on 4L chronically); currently stable on 3 L  -Pulmonology consulted >> recommended increasing steroid dose, labs  ordered, would like to assess rheumatology opinion before any type of bronch performed   HFrEF (EF40-45%): BNP elevated at 345.4 (from 2/24 at 45.7) in ED. S/p IV Lasix 40mg  in ED. CXR with signs of possible pulmonary edema although lung exam is unremarkable for crackles. No clinical signs of fluid overload.  - will restart home lasix   - Daily weights and strict I/O   Chest Pain: somewhat reassuring as there seems to be a msk component as pt has tenderness to palpation. EKG in ED poor tracing. Repeat EKGs without evidence of ST segment depression or elevation.  - troponin 0.05>> 0.05 >> 0.03 - telemetry   Hypotension. Patient with persistently low BPs in 80s/40s. This is near her baseline. - Continue to monitor  T2DM: Home meds: Metformin 1000mg  BID, Lantus 35 units daily.  - heart healthy carb modified diet -will increase to moderate SSI given increase in steroid dose and some CBGs in the mid 200s    Interstitial Lung Disease, on chronic prednisone and immunosuppressive therapy: On 4L O2 at home. Followed by Pulmonology and was supposed to have an appointment on 3/1.   - supplemental O2, to keep O2 > 92% - continue home Cellcept  - Patient was discharged on Prednisone taper: 30mg  for 5 days, then 20mg  for 5 days, then back to home dose of 10mg  daily. Will continue the taper with Prednisone 30mg  day 2/5 - cont Dulera BID PRN and Albuterol PRN   Congenital AVN Block s/p atrial pacer (placed at age 29yo):  - on telemetry   Recent Pyelonephritis: discharged 2/27, patient did not take medication at home (  was discharged with 9 days of Keflex). UA with trace hgb otherwise unremarkable.  - start Keflex  - urine culture obtained in ED  FEN/GI: heart healthy carb modified Prophylaxis: Lovenox Sub Q  Disposition: ? Rehab Placement   Subjective:  Reports feeling weak. Breathing has improved per patient. Denies SOB.   Objective: Temp:  [97.5 F (36.4 C)-98 F (36.7 C)]  97.8 F (36.6 C) (03/02 0458) Pulse Rate:  [75-89] 76 (03/02 0458) Resp:  [18-38] 18 (03/02 0458) BP: (87-103)/(49-56) 103/56 mmHg (03/02 0458) SpO2:  [95 %-97 %] 97 % (03/02 0458) Weight:  [171 lb 1.6 oz (77.61 kg)] 171 lb 1.6 oz (77.61 kg) (03/02 0700) Physical Exam: General: NAD, awake and alert  Cardiovascular: RRR. No murmurs appreciated.  Respiratory: CTAB. No crackles appreciated.  Extremities: No LE edema.  Ext: Grip strength 5/5 bilaterally. Strength 3/5 in LE bilaterally.   Laboratory:  Recent Labs Lab 12/17/15 0916 12/18/15 1445 12/19/15 0702  WBC 9.8 12.9* 8.5  HGB 9.6* 9.7* 8.9*  HCT 33.5* 32.1* 29.2*  PLT 397 352 265    Recent Labs Lab 12/14/15 0119 12/15/15 1636 12/17/15 0916 12/18/15 1445 12/19/15 0702  NA 134* 140 143 141 137  K 4.2 4.0 3.7 3.4* 4.0  CL 101 109 110 106 100*  CO2 23 19* 24 27 26   BUN 11 8 8 9 12   CREATININE 0.56 0.59 0.60 0.57 0.71  CALCIUM 7.8* 7.7* 8.6* 7.6* 7.6*  PROT 6.5 5.9*  --   --   --   BILITOT 1.7* 0.5  --   --   --   ALKPHOS 67 55  --   --   --   ALT 25 22  --   --   --   AST 59* 42*  --   --   --   GLUCOSE 169* 218* 112* 97 237*   BNP 345.4 Trop 0.05 >> 0.05   Imaging/Diagnostic Tests: Dg Chest 2 View  12/18/2015 CLINICAL DATA: Cough, lupus, history of asthma EXAM: CHEST 2 VIEW COMPARISON: CT chest 12/10/2015 FINDINGS: There are bilateral interstitial and alveolar airspace opacities. There are bilateral small pleural effusions. There is no pneumothorax. The heart and mediastinal contours are unremarkable. There is a cardiac pacemaker. The osseous structures are unremarkable. IMPRESSION: Bilateral interstitial and alveolar airspace opacities which may reflect pulmonary edema versus multi lobar pneumonia. Electronically Signed By: 12/20/2015 On: 12/18/2015 14:07   Dg Lumbar Spine Complete  12/18/2015 CLINICAL DATA: Lower extremity weakness for 2 months. EXAM: LUMBAR SPINE - COMPLETE 4+ VIEW COMPARISON: CT,  12/14/2015 FINDINGS: L5 is a transitional lumbosacral vertebrae. There is no fracture spondylolisthesis. There are no bone lesions. No degenerative changes. Mild straightening of the normal lumbar lordosis unchanged from the CT. Soft tissues are unremarkable. IMPRESSION: 1. No fracture or acute finding. 2. Mild straightening of the normal lumbar lordosis and transitional lumbosacral vertebrae. Otherwise unremarkable. Electronically Signed By: 12/20/2015 M.D. On: 12/18/2015 23:44   Amie Portland, DO 12/20/2015, 8:47 AM PGY-1, [place] [facility] FPTS Intern pager: [phone], text pages welcome

## 2015-12-20 NOTE — Progress Notes (Signed)
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  She was recently admitted (just d/c 2/25) with pyelonephritis.  Pt has not taken any of her home medications since d/c including keflex for pyelo.  She returned 2/28 with c/o BLE weakness and SOB.  She was admitted by FPTS with HCAP and PCCM consulted for assist with ILD, dyspnea.   SIGNIFICANT EVENTS    STUDIES:    SUBJECTIVE:   Feeling some better.  Breathing feels back to baseline.  Denies pain.    VITAL SIGNS: Temp:  [97.5 F (36.4 C)-98 F (36.7 C)] 97.8 F (36.6 C) (03/02 0458) Pulse Rate:  [75-89] 76 (03/02 0458) Resp:  [18-38] 18 (03/02 0458) BP: (87-103)/(49-56) 103/56 mmHg (03/02 0458) SpO2:  [95 %-97 %] 97 % (03/02 0458) Weight:  [77.61 kg (171 lb 1.6 oz)] 77.61 kg (171 lb 1.6 oz) (03/02 0700)  PHYSICAL EXAMINATION: General:  Pleasant chronically ill appearing female, NAD Neuro:  Awake, alert, appropriate HEENT:  Mm moist, no JVD  Cardiovascular:  s1s2 rrr Lungs:  resps even non labored on [place], few scattered rhonchi L>R   Abdomen:  Round, soft, +bs  Musculoskeletal:  Warm and dry, no edema    Recent Labs Lab 12/17/15 0916 12/18/15 1445 12/19/15 0702  NA 143 141 137  K 3.7 3.4* 4.0  CL 110 106 100*  CO2 24 27 26   BUN 8 9 12   CREATININE 0.60 0.57 0.71  GLUCOSE 112* 97 237*    Recent Labs Lab 12/17/15 0916 12/18/15 1445 12/19/15 0702  HGB 9.6* 9.7* 8.9*  HCT 33.5* 32.1* 29.2*  WBC 9.8 12.9* 8.5  PLT 397 352 265   Dg Chest 2 View  12/19/2015  CLINICAL DATA:  Recent admit for UTI EXAM:  CHEST  2 VIEW COMPARISON:  12/18/2015 FINDINGS: Cardiac shadow is mildly enlarged. A pacing device is again seen. Patchy infiltrates are again noted throughout both lungs although some mild improved aeration is seen. The infiltrative changes are noted worst in the right lung base. No acute bony abnormality is noted. IMPRESSION: Slight improvement in bilateral infiltrates Electronically Signed   By: Inez Catalina M.D.   On: 12/19/2015 08:02   Dg Chest 2 View  12/18/2015  CLINICAL DATA:  Cough, lupus, history of asthma EXAM: CHEST  2 VIEW COMPARISON:  CT chest 12/10/2015 FINDINGS: There are bilateral interstitial and alveolar airspace opacities. There are bilateral small pleural effusions. There is no pneumothorax. The heart and mediastinal contours are unremarkable. There is a cardiac pacemaker. The osseous structures are unremarkable. IMPRESSION: Bilateral interstitial and alveolar airspace opacities which may reflect pulmonary edema versus multi lobar pneumonia. Electronically Signed   By: Kathreen Devoid   On: 12/18/2015 14:07   Dg Lumbar Spine Complete  12/18/2015  CLINICAL DATA:  Lower extremity weakness for 2 months. EXAM: LUMBAR SPINE - COMPLETE 4+ VIEW COMPARISON:  CT, 12/14/2015 FINDINGS: L5 is a transitional lumbosacral vertebrae. There is no fracture spondylolisthesis. There are no bone lesions. No degenerative changes. Mild straightening of the normal lumbar lordosis unchanged from the CT. Soft tissues  are unremarkable. IMPRESSION: 1. No fracture or acute finding. 2. Mild straightening of the normal lumbar lordosis and transitional lumbosacral vertebrae. Otherwise unremarkable. Electronically Signed   By: Lajean Manes M.D.   On: 12/18/2015 23:44    ASSESSMENT / PLAN:  Acute on chronic hypoxic respiratory failure - multifactorial in setting ILD, lupus pneumonitis, heart failure +/- HCAP (doubt).  ILD - never bx.   SLE   ESR = 109 c3 =90 c4=25 dsdna= ANA= ch50=   PLAN -   -Continue  steroids - will increase to 25m/day (closer to 170mkg) - taper slowly back to maintenance dose of 1034may -Supplemental O2 as needed - maintenance 4L -Cont abx for UTI/pyelo but agree with d/c broad spectrum abx - doubt HCAP -Gentle diuresis as tol  -outpt rheum f/u  - Would prefer not to do in the acute setting when she is having flare +/- infection. We should continue treatment (steroids, abx for UTI), check above labs, get her back home and near baseline, have her see rheum again and then determine when/if she needs open lung bx.   -outpt pulm f/u - scheduled    KatNickolas MadridP 12/20/2015  10:13 AM Pager: [phone] or [phone]

## 2015-12-20 NOTE — Progress Notes (Signed)
Ankit Lorie Phenix, MD Physician Signed Physical Medicine and Rehabilitation Consult Note 12/19/2015 12:43 PM  Related encounter: ED to Hosp-Admission (Current) from 12/18/2015 in [facility] Collapse All        Physical Medicine and Rehabilitation Consult Reason for Consult: LE weakness  HPI: Makayla Vasquez is a 35 y.o. right handed female with history of diastolic congestive heart failure, congenital AV block with pacemaker, lupus with chronic prednisone and immunosuppressive therapy with chronic oxygen and chronic hypotension. By report patient was independent in her home environment up until 3 weeks ago needing physical assistance from her boyfriend for all transfers and ADLs due to progressive weakness lower extremities. Patient with recent admission for pyelonephritis as well as recent viral gastroenteritis. Presented 12/18/2015 with generalized weakness and shortness of breath. Chest x-ray showed pulmonary edema versus multilobar pneumonia. WBC 12,900. Oxygen saturation is 92% on 4 L oxygen. Placed on broad-spectrum antibiotics. Lumbar spine films showed no fracture or acute finding. Recent echocardiogram with ejection fraction 45%. Troponin mildly elevated 0.05 felt to be related to demand ischemia. Subcutaneous Lovenox for DVT prophylaxis. Physical therapy evaluation completed 12/19/2015 with recommendations of physical medicine rehabilitation consult.   Review of Systems  Constitutional: Negative for fever and chills.  Respiratory: Negative for cough.   Shortness of breath with exertion  Cardiovascular: Positive for palpitations and leg swelling. Negative for chest pain.  Gastrointestinal: Positive for constipation. Negative for nausea and vomiting.   GERD  Genitourinary: Positive for urgency and frequency.  Musculoskeletal: Positive for myalgias and joint pain.  Skin: Negative for rash.  Neurological: Positive for weakness and  headaches.  Psychiatric/Behavioral: Positive for depression.  All other systems reviewed and are negative.  Past Medical History  Diagnosis Date  . Cardiac pacemaker     a. Since age 33 in 70. b. Upgrade to BiV in 2013.  . Congenital complete AV block   . Obesity   . GERD (gastroesophageal reflux disease)   . Asymptomatic LV dysfunction     a. Echo in Dec 2011 with EF 35 to 40%. Felt to be due to paced rhythm. b. EF 25-30% in 07/2014.  . Seizures ([place])     as a child- from high fever  . Anxiety   . Bipolar affective disorder ([place])   . Depression     bipolar  . Carpal tunnel syndrome of right wrist   . Asthma     seasonal allergies   . Arthritis     rheumatoid arthritis- mild, no rheumatology care   . Hypertension   . Pneumonitis     a. a/w hypoxia - inflammatory - large workup 07/2014.  [name] Sinus tachycardia ([place])   . Diabetes mellitus without complication ([place])   . Presence of permanent cardiac pacemaker   . CHF (congestive heart failure) ([place])   . Lupus ([address])   . Lupus (systemic lupus erythematosus) ([place])    Past Surgical History  Procedure Laterality Date  . Throat surgery  1994    s/p laser treatment  . Cesarean section    . Insert / replace / remove pacemaker      2001  . Cholecystectomy    . Iud removal  11/03/2011    Procedure: INTRAUTERINE DEVICE (IUD) REMOVAL; Surgeon: [name], MD; Location: [facility]; Service: Gynecology; Laterality: N/A;  . Cyst excision  12/10/2012    THYROID  . Thyroglossal duct cyst N/A 12/10/2012    Procedure: REVISION OF THYROGLOSSAL  DUCT CYST EXCISION; Surgeon: [name], MD; Location: [facility]; Service: ENT; Laterality: N/A; Revision of Thyroglossal Duct Cyst Excision  . Nasal fracture surgery      /w plate   . Carpal tunnel with cubital tunnel Right 07/26/2013    Procedure: RIGHT  LIMITED OPEN CARPAL TUNNEL RELEASE , RIGHT CUBITAL TUNNEL RELEASE, INSITU VERSES ULNAR NERVE DECOMPRESSION AND ANTERIOR TRANSPOSITION; Surgeon: [name], MD; Location: [facility]; Service: Orthopedics; Laterality: Right;  . Video bronchoscopy Bilateral 06/19/2014    Procedure: VIDEO BRONCHOSCOPY WITHOUT FLUORO; Surgeon: Brand Males, MD; Location: [facility]; Service: Cardiopulmonary; Laterality: Bilateral;  . Bi-ventricular pacemaker upgrade N/A 03/08/2012    Procedure: BI-VENTRICULAR PACEMAKER UPGRADE; Surgeon: [name], MD; Location: [facility] CATH LAB; Service: Cardiovascular; Laterality: N/A;  . Atrial tach ablation N/A 08/14/2014    Procedure: ATRIAL TACH ABLATION; Surgeon: [name], MD; Location: [facility] CATH LAB; Service: Cardiovascular; Laterality: N/A;  . Right heart catheterization N/A 10/26/2014    Procedure: RIGHT HEART CATH; Surgeon: [name], MD; Location: [facility] CATH LAB; Service: Cardiovascular; Laterality: N/A;   Family History  Problem Relation Age of Onset  . Heart disease Mother     CHF (no details)  . Hypertension Mother   . Heart disease Father     Murmur  . Heart disease Sister 31    No details. History of a pacemaker  . Lupus Mother    Social History:  reports that she quit smoking about 19 years ago. Her smoking use included Cigarettes. She has a .125 pack-year smoking history. She has never used smokeless tobacco. She reports that she does not drink alcohol or use illicit drugs. Allergies:  Allergies  Allergen Reactions  . Sertraline Hcl Hives  . Tape Other (See Comments)    Burns skin   Medications Prior to Admission  Medication Sig Dispense Refill  . albuterol (PROVENTIL HFA;VENTOLIN HFA) 108 (90 BASE) MCG/ACT inhaler Inhale 2 puffs into the lungs every 4 (four) hours as needed for wheezing or shortness of breath. 1 each 1  . atorvastatin  (LIPITOR) 40 MG tablet Take 1 tablet (40 mg total) by mouth daily. 90 tablet 3  . bisoprolol (ZEBETA) 5 MG tablet Take 2.5 mg by mouth daily.    . budesonide-formoterol (SYMBICORT) 80-4.5 MCG/ACT inhaler Inhale 2 puffs into the lungs 2 (two) times daily as needed (for shortness of breath).     . calcium-vitamin D (OSCAL WITH D) 500-200 MG-UNIT per tablet Take 1 tablet by mouth daily with breakfast.     . cephALEXin (KEFLEX) 500 MG capsule Take 1 capsule (500 mg total) by mouth every 6 (six) hours. 36 capsule 0  . etonogestrel (NEXPLANON) 68 MG IMPL implant 1 each by Subdermal route once.    . fluocinonide (LIDEX) 0.05 % external solution 1 APPLICATION APPLY ON THE SKIN AS DIRECTED APPLY TO AFFECTED AREAS DAILY ON SCALP AS NEEDED FOR ITCHING.  5  . furosemide (LASIX) 20 MG tablet Take 20 mg by mouth daily.  5  . Insulin Glargine (LANTUS SOLOSTAR) 100 UNIT/ML Solostar Pen Inject 35 Units into the skin daily at 10 pm. (Patient taking differently: Inject 35 Units into the skin daily as needed (low blood sugar). ) 15 mL 5  . metFORMIN (GLUCOPHAGE) 1000 MG tablet TAKE 1 TABLET BY MOUTH TWICE A DAY WITH A MEAL 30 tablet 11  . mycophenolate (CELLCEPT) 500 MG tablet Take 500 mg by mouth 2 (two) times daily.    . Nutritional Supplements ([place]) LIQD  Take supplement daily in place of 1 meal. (Patient taking differently: Take 1 Can by mouth daily as needed (meal replacement). Take supplement daily in place of 1 meal.) 30 Bottle 2  . oxyCODONE-acetaminophen (PERCOCET) 5-325 MG tablet Take 1 tablet by mouth every 6 (six) hours as needed for severe pain. 60 tablet 0  . potassium chloride SA (KLOR-CON M20) 20 MEQ tablet Take 1 tablet (20 mEq total) by mouth daily. 30 tablet 2  . Blood Glucose Monitoring Suppl (ONE TOUCH ULTRA 2) W/DEVICE KIT Check blood sugar daily before breakfast 1 each 0  . glucose blood (ONE TOUCH ULTRA  TEST) test strip Check blood sugar daily before breakfast 100 each 0  . Insulin Pen Needle (INSUPEN PEN NEEDLES) 32G X 4 MM MISC Use with insulin pen 30 each 0  . predniSONE (DELTASONE) 10 MG tablet Take 3 tablets for 5 days, then 2 tablets for 5 days, then return 1 tablet daily (Patient not taking: Reported on 12/18/2015) 90 tablet 0    Home: Home Living Family/patient expects to be discharged to:: Private residence Living Arrangements: Spouse/significant other Available Help at Discharge: Family, Available 24 hours/day (boyfriend available) Type of Home: Apartment Home Access: Stairs to enter [employer] of Steps: 2 short steps Entrance Stairs-Rails: Left Home Layout: One level Bathroom Shower/Tub: Chiropodist: Standard Home Equipment: None  Functional History: Prior Function Level of Independence: Independent, Needs assistance Gait / Transfers Assistance Needed: pt states she needs assist to get out of tub at home, walks on her own typically but has needed boyfriend for all transfers the past 3 weeks ADL's / Homemaking Assistance Needed: pt has 5 y.o. son,boyfriend has been assisting with all ADLs the past few weeks, was independent before Communication / Swallowing Assistance Needed: no difficulties Comments: unless in a lupus flare pt is functionally independent with supplemental O2 Functional Status:  Mobility: Bed Mobility Overal bed mobility: Needs Assistance Bed Mobility: Supine to Sit Supine to sit: Mod assist General bed mobility comments: pt pivoted with assit from bed rails, assist needed to bring LEs to EOBa nd to bring trunk upright as pt fatigued quickly Transfers Overall transfer level: Needs assistance Equipment used: 2 person hand held assist Transfers: Sit to/from Stand, Stand Pivot Transfers Sit to Stand: Mod assist, +2 physical assistance Stand pivot transfers: Min assist, +2 physical assistance General transfer  comment: Mod A for initial boost, pulling up on back of therapists arms, manual cues to facilitate hip extension, once stand took small pivot steps to chair with min A for knee buckling and cues for slow controlled descent Ambulation/Gait General Gait Details: deferred due to increased in HR and fatigue    ADL:    Cognition: Cognition Overall Cognitive Status: Within Functional Limits for tasks assessed Orientation Level: Oriented X4 Cognition Arousal/Alertness: Awake/alert Behavior During Therapy: Flat affect, WFL for tasks assessed/performed Overall Cognitive Status: Within Functional Limits for tasks assessed  Blood pressure 91/58, pulse 82, temperature 97.6 F (36.4 C), temperature source Oral, resp. rate 18, height 5' 5" (1.651 m), weight 83 kg (182 lb 15.7 oz), SpO2 96 %. Physical Exam  Vitals reviewed. Constitutional: She is oriented to person, place, and time. She appears well-developed and well-nourished.  HENT:  Head: Normocephalic and atraumatic.  Eyes: Conjunctivae and EOM are normal.  Neck: Normal range of motion. Neck supple. No thyromegaly present.  Cardiovascular: Normal rate and regular rhythm.  Respiratory: Effort normal and breath sounds normal. No respiratory distress.  GI: Soft. Bowel sounds  are normal. She exhibits no distension.  Musculoskeletal: She exhibits no edema or tenderness.  Neurological: She is alert and oriented to person, place, and time.  Sensation intact to light touch DTRs symmetric  Motor: B/l UE 4/5 proximally, 5/5 hand grip B/l LE: hip flexion 2/5, knee extension 2/5, ankle dorsi/plantar flexion 4+/5  Skin: Skin is warm and dry.  Psychiatric: Her speech is normal and behavior is normal.     Lab Results Last 24 Hours    Results for orders placed or performed during the hospital encounter of 12/18/15 (from the past 24 hour(s))  CBC with Differential Status: Abnormal   Collection Time: 12/18/15 2:45 PM  Result Value Ref  Range   WBC 12.9 (H) 4.0 - 10.5 K/uL   RBC 3.85 (L) 3.87 - 5.11 MIL/uL   Hemoglobin 9.7 (L) 12.0 - 15.0 g/dL   HCT 32.1 (L) 36.0 - 46.0 %   MCV 83.4 78.0 - 100.0 fL   MCH 25.2 (L) 26.0 - 34.0 pg   MCHC 30.2 30.0 - 36.0 g/dL   RDW 18.5 (H) 11.5 - 15.5 %   Platelets 352 150 - 400 K/uL   Neutrophils Relative % 84 %   Lymphocytes Relative 12 %   Monocytes Relative 4 %   Eosinophils Relative 0 %   Basophils Relative 0 %   Neutro Abs 10.9 (H) 1.7 - 7.7 K/uL   Lymphs Abs 1.5 0.7 - 4.0 K/uL   Monocytes Absolute 0.5 0.1 - 1.0 K/uL   Eosinophils Absolute 0.0 0.0 - 0.7 K/uL   Basophils Absolute 0.0 0.0 - 0.1 K/uL   RBC Morphology POLYCHROMASIA PRESENT   Basic metabolic panel Status: Abnormal   Collection Time: 12/18/15 2:45 PM  Result Value Ref Range   Sodium 141 135 - 145 mmol/L   Potassium 3.4 (L) 3.5 - 5.1 mmol/L   Chloride 106 101 - 111 mmol/L   CO2 27 22 - 32 mmol/L   Glucose, Bld 97 65 - 99 mg/dL   BUN 9 6 - 20 mg/dL   Creatinine, Ser 0.57 0.44 - 1.00 mg/dL   Calcium 7.6 (L) 8.9 - 10.3 mg/dL   GFR calc non Af Amer >60 >60 mL/min   GFR calc Af Amer >60 >60 mL/min   Anion gap 8 5 - 15  Brain natriuretic peptide Status: Abnormal   Collection Time: 12/18/15 2:45 PM  Result Value Ref Range   B Natriuretic Peptide 345.4 (H) 0.0 - 100.0 pg/mL  Urinalysis, Routine w reflex microscopic Status: Abnormal   Collection Time: 12/18/15 4:19 PM  Result Value Ref Range   Color, Urine YELLOW YELLOW   APPearance CLOUDY (A) CLEAR   Specific Gravity, Urine 1.015 1.005 - 1.030   pH 7.0 5.0 - 8.0   Glucose, UA NEGATIVE NEGATIVE mg/dL   Hgb urine dipstick TRACE (A) NEGATIVE   Bilirubin Urine NEGATIVE NEGATIVE   Ketones, ur NEGATIVE NEGATIVE mg/dL   Protein, ur NEGATIVE NEGATIVE mg/dL   Nitrite  NEGATIVE NEGATIVE   Leukocytes, UA NEGATIVE NEGATIVE  Urine culture Status: None (Preliminary result)   Collection Time: 12/18/15 4:19 PM  Result Value Ref Range   Specimen Description URINE, CLEAN CATCH    Special Requests NONE    Culture      TOO YOUNG TO READ Performed at [facility]    Report Status PENDING   Urine microscopic-add on Status: Abnormal   Collection Time: 12/18/15 4:19 PM  Result Value Ref Range   Squamous Epithelial / LPF  6-30 (A) NONE SEEN   WBC, UA 0-5 0 - 5 WBC/hpf   RBC / HPF 0-5 0 - 5 RBC/hpf   Bacteria, UA FEW (A) NONE SEEN  Troponin I Status: Abnormal   Collection Time: 12/18/15 7:30 PM  Result Value Ref Range   Troponin I 0.05 (H) <0.031 ng/mL  Glucose, capillary Status: None   Collection Time: 12/18/15 7:37 PM  Result Value Ref Range   Glucose-Capillary 73 65 - 99 mg/dL  Lactic acid, plasma Status: None   Collection Time: 12/18/15 11:40 PM  Result Value Ref Range   Lactic Acid, Venous 1.2 0.5 - 2.0 mmol/L  Troponin I Status: Abnormal   Collection Time: 12/19/15 1:01 AM  Result Value Ref Range   Troponin I 0.05 (H) <0.031 ng/mL  Lactic acid, plasma Status: None   Collection Time: 12/19/15 1:01 AM  Result Value Ref Range   Lactic Acid, Venous 0.8 0.5 - 2.0 mmol/L  Vitamin B12 Status: None   Collection Time: 12/19/15 1:01 AM  Result Value Ref Range   Vitamin B-12 184 180 - 914 pg/mL  TSH Status: Abnormal   Collection Time: 12/19/15 1:07 AM  Result Value Ref Range   TSH 5.581 (H) 0.350 - 4.500 uIU/mL  MRSA PCR Screening Status: None   Collection Time: 12/19/15 2:55 AM  Result Value Ref Range   MRSA by PCR NEGATIVE NEGATIVE  Troponin I Status: None   Collection Time: 12/19/15 7:02 AM  Result Value Ref Range   Troponin I 0.03 <0.031 ng/mL    Basic metabolic panel Status: Abnormal   Collection Time: 12/19/15 7:02 AM  Result Value Ref Range   Sodium 137 135 - 145 mmol/L   Potassium 4.0 3.5 - 5.1 mmol/L   Chloride 100 (L) 101 - 111 mmol/L   CO2 26 22 - 32 mmol/L   Glucose, Bld 237 (H) 65 - 99 mg/dL   BUN 12 6 - 20 mg/dL   Creatinine, Ser 0.71 0.44 - 1.00 mg/dL   Calcium 7.6 (L) 8.9 - 10.3 mg/dL   GFR calc non Af Amer >60 >60 mL/min   GFR calc Af Amer >60 >60 mL/min   Anion gap 11 5 - 15  CBC Status: Abnormal   Collection Time: 12/19/15 7:02 AM  Result Value Ref Range   WBC 8.5 4.0 - 10.5 K/uL   RBC 3.61 (L) 3.87 - 5.11 MIL/uL   Hemoglobin 8.9 (L) 12.0 - 15.0 g/dL   HCT 29.2 (L) 36.0 - 46.0 %   MCV 80.9 78.0 - 100.0 fL   MCH 24.7 (L) 26.0 - 34.0 pg   MCHC 30.5 30.0 - 36.0 g/dL   RDW 18.4 (H) 11.5 - 15.5 %   Platelets 265 150 - 400 K/uL  Glucose, capillary Status: Abnormal   Collection Time: 12/19/15 8:14 AM  Result Value Ref Range   Glucose-Capillary 179 (H) 65 - 99 mg/dL   Comment 1 Notify RN    Comment 2 Document in Chart   Glucose, capillary Status: Abnormal   Collection Time: 12/19/15 12:05 PM  Result Value Ref Range   Glucose-Capillary 181 (H) 65 - 99 mg/dL   Comment 1 Notify RN    Comment 2 Document in Chart       Imaging Results (Last 48 hours)    Dg Chest 2 View  12/19/2015 CLINICAL DATA: Recent admit for UTI EXAM: CHEST 2 VIEW COMPARISON: 12/18/2015 FINDINGS: Cardiac shadow is mildly enlarged. A pacing device is again seen. Patchy infiltrates are again  noted throughout both lungs although some mild improved aeration is seen. The infiltrative changes are noted worst in the right lung base. No acute bony abnormality is noted. IMPRESSION: Slight improvement in bilateral infiltrates Electronically Signed By: Inez Catalina M.D. On: 12/19/2015 08:02   Dg  Chest 2 View  12/18/2015 CLINICAL DATA: Cough, lupus, history of asthma EXAM: CHEST 2 VIEW COMPARISON: CT chest 12/10/2015 FINDINGS: There are bilateral interstitial and alveolar airspace opacities. There are bilateral small pleural effusions. There is no pneumothorax. The heart and mediastinal contours are unremarkable. There is a cardiac pacemaker. The osseous structures are unremarkable. IMPRESSION: Bilateral interstitial and alveolar airspace opacities which may reflect pulmonary edema versus multi lobar pneumonia. Electronically Signed By: Kathreen Devoid On: 12/18/2015 14:07   Dg Lumbar Spine Complete  12/18/2015 CLINICAL DATA: Lower extremity weakness for 2 months. EXAM: LUMBAR SPINE - COMPLETE 4+ VIEW COMPARISON: CT, 12/14/2015 FINDINGS: L5 is a transitional lumbosacral vertebrae. There is no fracture spondylolisthesis. There are no bone lesions. No degenerative changes. Mild straightening of the normal lumbar lordosis unchanged from the CT. Soft tissues are unremarkable. IMPRESSION: 1. No fracture or acute finding. 2. Mild straightening of the normal lumbar lordosis and transitional lumbosacral vertebrae. Otherwise unremarkable. Electronically Signed By: Lajean Manes M.D. On: 12/18/2015 23:44     Assessment/Plan: Diagnosis: Steroid myopathy +/- CIP +/- CIM (given pt's history of chronic steroids, recentste infections, and recent hospitalizations) Labs and images independently reviewed. Records reviewed and summated above.  1. Does the need for close, 24 hr/day medical supervision in concert with the patient's rehab needs make it unreasonable for this patient to be served in a less intensive setting? Potentially  2. Co-Morbidities requiring supervision/potential complications: diastolic congestive heart failure, congenital AV block with pacemaker (monitor with increased activity), lupus with chronic prednisone and immunosuppressive, chronic oxygen (cont to monitor with increased  activity), chronic hypotension (monitor with increased ambulation), tachypnea (monitor RR and O2 Sats with increased physical exertion), ABLA (transfuse if necessary to ensure appropriate perfusion for increased activity tolerance) 3. Due to safety, disease management and patient education, does the patient require 24 hr/day rehab nursing? Potentially 4. Does the patient require coordinated care of a physician, rehab nurse, PT (1-2 hrs/day, 5 days/week) and OT (1-2 hrs/day, 5 days/week) to address physical and functional deficits in the context of the above medical diagnosis(es)? Potentially Addressing deficits in the following areas: endurance, locomotion, strength, transferring, bathing, grooming, toileting and psychosocial support 5. Can the patient actively participate in an intensive therapy program of at least 3 hrs of therapy per day at least 5 days per week? Potentially 6. The potential for patient to make measurable gains while on inpatient rehab is TBD 7. Anticipated functional outcomes upon discharge from inpatient rehab are TBD with PT, TBD with OT, n/a with SLP. 8. Estimated rehab length of stay to reach the above functional goals is: TBD 9. Does the patient have adequate social supports and living environment to accommodate these discharge functional goals? Yes 10. Anticipated D/C setting: Home 11. Anticipated post D/C treatments: HH therapy and Home excercise program 12. Overall Rehab/Functional Prognosis: good  RECOMMENDATIONS: This patient's condition is appropriate for continued rehabilitative care in the following setting: Will await consult from therapies before determining most appropriate discharge disposition for pt. [facility] CIR. Patient has agreed to participate in recommended program. Yes Note that insurance prior authorization may be required for reimbursement for recommended care.  Comment: Rehab Admissions Coordinator to follow up.  Delice Lesch, MD 12/19/2015  Revision History     Date/Time User Provider Type Action   12/19/2015 3:43 PM Ankit Lorie Phenix, MD Physician Sign   12/19/2015 1:05 PM Cathlyn Parsons, PA-C Physician Assistant Pend   View Details Report       Routing History     Date/Time From To Method   12/19/2015 3:43 PM Ankit Lorie Phenix, MD Ankit Lorie Phenix, MD In [facility]   12/19/2015 3:43 PM Ankit Lorie Phenix, MD [name], DO In Basket

## 2015-12-20 NOTE — Progress Notes (Addendum)
Inpatient Diabetes Program Recommendations  AACE/ADA: New Consensus Statement on Inpatient Glycemic Control (2015)  Target Ranges:  Prepandial:   less than 140 mg/dL      Peak postprandial:   less than 180 mg/dL (1-2 hours)      Critically ill patients:  140 - 180 mg/dL   Results for SUMMER, MCCOLGAN (MRN [id number]) as of 12/20/2015 09:17  Ref. Range 12/19/2015 08:14 12/19/2015 12:05 12/19/2015 16:16 12/19/2015 21:18 12/20/2015 08:01  Glucose-Capillary Latest Ref Range: 65-99 mg/dL 681 (H) 275 (H) 170 (H) 261 (H) 142 (H)   Review of Glycemic Control  Diabetes history: DM 2 Outpatient Diabetes medications: Lantus 35 units as needed, Metformin 1,000 mg BID Current orders for Inpatient glycemic control: Novolog Sensitive + HS scale  Inpatient Diabetes Program Recommendations: Correction (SSI): Patient on Prednisone 30 mg Daily, Glucose increases during the day after prednisone dose. If patient continues on same dose of prednisone, consider increasing Novolog to Moderate Correction.  Thanks,  Christena Deem RN, MSN, [facility] Inpatient Diabetes Coordinator Team Pager [phone] (8a-5p)

## 2015-12-20 NOTE — NC FL2 (Signed)
[place] MEDICAID FL2 LEVEL OF CARE SCREENING TOOL     IDENTIFICATION  Patient Name: Makayla Vasquez Birthdate: 17-Jun-1981 Sex: female Admission Date (Current Location): 12/18/2015  [facility] and [place] Number:  Producer, television/film/video and Address:  [facility] [place], [address] [place], [place] [address]      Provider Number: [id number]  Attending Physician Name and Address:  Nestor Ramp, MD  Relative Name and Phone Number:  Clydie Braun, sister, [phone]    Current Level of Care: Hospital Recommended Level of Care: Skilled Nursing Facility Prior Approval Number:    Date Approved/Denied:   PASRR Number: [id number]  Discharge Plan: SNF    Current Diagnoses: Patient Active Problem List   Diagnosis Date Noted  . Acute respiratory failure (HCC)   . Generalized weakness   . Lower extremity weakness   . Acute on chronic congestive heart failure (HCC)   . AV block   . Acute blood loss anemia   . Supplemental oxygen dependent   . Tachypnea   . Critical illness neuropathy (HCC)   . Steroid myopathy   . HCAP (healthcare-associated pneumonia) 12/18/2015  . Acute pyelonephritis   . Palpitations   . Complete heart block (HCC)   . Cardiac pacemaker in situ   . UTI (urinary tract infection) with pyuria   . Diarrhea 12/14/2015  . Viral gastroenteritis 12/14/2015  . Lower limb pain, anterior   . Calf pain   . Adrenal insufficiency (HCC)   . Acute on chronic diastolic CHF (congestive heart failure) (HCC)   . Arterial hypotension   . SOB (shortness of breath)   . Lupus (systemic lupus erythematosus) (HCC)   . Congenital heart block   . Chronic diastolic congestive heart failure (HCC)   . Hypotension 07/08/2015  . Fatigue 06/05/2015  . Nausea with vomiting 06/05/2015  . Hyperlipidemia associated with type 2 diabetes mellitus (HCC) 05/04/2015  . Polyarthralgia 04/26/2015  . Rhinosinusitis 04/19/2015  . Screening for cervical cancer 01/19/2015  .  Onychomycosis of right great toe 12/14/2014  . Discoloration of skin 12/01/2014  . Lip abscess 11/02/2014  . CHF (congestive heart failure) (HCC)   . Diabetes mellitus type 2 in obese (HCC)   . Hyperglycemia 10/24/2014  . Dehydration   . Sinus tachycardia (HCC)   . Chronic respiratory failure with hypoxia (HCC) 08/07/2014  . Atrial tachycardia (HCC) 07/26/2014  . Insertion of implantable subdermal contraceptive 07/12/2014  . Acute respiratory failure with hypoxia (HCC) 06/19/2014  . ILD (interstitial lung disease) (HCC) 06/19/2014  . Pulmonary infiltrates 06/17/2014  . Dyspnea 06/16/2014  . Hypoxemia 06/16/2014  . Congestive dilated cardiomyopathy (HCC) 05/31/2014  . Nonallergic rhinitis 05/22/2014  . Vaginal itching 05/22/2014  . Pneumonia 05/09/2014  . Shortness of breath 05/08/2014  . Sterilization consult 04/20/2014  . Breast tenderness 01/18/2014  . Carpal tunnel syndrome 09/10/2012  . Constipation 03/03/2012  . Chronic systolic heart failure (HCC) 02/26/2012  . Chest pain, unspecified 12/29/2011  . Post-nasal drip 10/24/2011  . Contraception 01/29/2011  . Heart palpitations   . PPM-Medtronic   . Congenital complete AV block   . Bipolar affective disorder (HCC)   . Obesity   . GERD (gastroesophageal reflux disease)   . Hypertension 01/05/2011  . THYROGLOSSAL DUCT CYST 07/18/2010  . UNSPECIFIED CARDIAC DYSRHYTHMIA 05/07/2010  . RH FACTOR, NEGATIVE 04/08/2010  . Mild intermittent asthma without complication 01/11/2009  . 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 HEART BLOCK 12/17/2006  . IRRITABLE BOWEL SYNDROME 12/17/2006  Orientation RESPIRATION BLADDER Height & Weight     Self, Time, Situation, Place  O2 (Nasal Cannula 3L) Continent Weight: 171 lb 1.6 oz (77.61 kg) Height:  5\' 5"  (165.1 cm)  BEHAVIORAL SYMPTOMS/MOOD NEUROLOGICAL BOWEL NUTRITION STATUS      Continent  (Please see DC summary)  AMBULATORY STATUS COMMUNICATION OF NEEDS Skin   Extensive Assist Verbally                          Personal Care Assistance Level of Assistance  Bathing, Feeding, Dressing Bathing Assistance: Maximum assistance Feeding assistance: Independent Dressing Assistance: Limited assistance     Functional Limitations Info  Sight Sight Info: Impaired        SPECIAL CARE FACTORS FREQUENCY  PT (By licensed PT), OT (By licensed OT)     PT Frequency: min 4x/week OT Frequency: min 2x/week            Contractures      Additional Factors Info  Code Status, Allergies, Insulin Sliding Scale Code Status Info: Full Allergies Info: Sertraline Hcl, Tape   Insulin Sliding Scale Info: insulin aspart (novoLOG) injection 0-15 Units; insulin aspart (novoLOG) injection 0-5 Units;       Current Medications (12/20/2015):  This is the current hospital active medication list Current Facility-Administered Medications  Medication Dose Route Frequency Provider Last Rate Last Dose  . 0.9 %  sodium chloride infusion  250 mL Intravenous PRN 02/19/2016, MD      . acetaminophen (TYLENOL) tablet 650 mg  650 mg Oral Q6H PRN Palma Holter, MD      . albuterol (PROVENTIL) (2.5 MG/3ML) 0.083% nebulizer solution 2.5 mg  2.5 mg Inhalation Q4H PRN Palma Holter, MD      . cephALEXin (KEFLEX) capsule 500 mg  500 mg Oral 4 times per day Palma Holter, MD   500 mg at 12/20/15 1216  . enoxaparin (LOVENOX) injection 40 mg  40 mg Subcutaneous Q24H 02/19/16, MD   40 mg at 12/19/15 1932  . furosemide (LASIX) tablet 20 mg  20 mg Oral Daily 02/18/16, MD   20 mg at 12/20/15 0813  . insulin aspart (novoLOG) injection 0-15 Units  0-15 Units Subcutaneous TID WC 12-19-1984, DO   8 Units at 12/20/15 1216  . insulin aspart (novoLOG) injection 0-5 Units  0-5 Units Subcutaneous QHS 02/19/16, DO      . mometasone-formoterol [facility]) 100-5 MCG/ACT inhaler 2 puff  2 puff Inhalation BID [facility], MD   2 puff at 12/19/15  2015  . mycophenolate (CELLCEPT) capsule 500 mg  500 mg Oral BID 02/18/16, MD   500 mg at 12/20/15 02/19/16  . oxyCODONE-acetaminophen (PERCOCET/ROXICET) 5-325 MG per tablet 1 tablet  1 tablet Oral Q6H PRN 2458, MD   1 tablet at 12/20/15 [phone]  . polyethylene glycol (MIRALAX / GLYCOLAX) packet 17 g  17 g Oral Daily PRN 0998, MD      . predniSONE (DELTASONE) tablet 30 mg  30 mg Oral BID WC Palma Holter, NP      . sodium chloride flush (NS) 0.9 % injection 3 mL  3 mL Intravenous Q12H Bernadene Person, MD   3 mL at 12/19/15 1000  . sodium chloride flush (NS) 0.9 % injection 3 mL  3 mL Intravenous Q12H 02/18/16, MD   3 mL at 12/20/15 0815  .  sodium chloride flush (NS) 0.9 % injection 3 mL  3 mL Intravenous PRN Palma Holter, MD         Discharge Medications: Please see discharge summary for a list of discharge medications.  Relevant Imaging Results:  Relevant Lab Results:   Additional Information SSN: [id number] [place], [place]

## 2015-12-20 NOTE — Progress Notes (Signed)
Patient ID: Makayla Vasquez, female   DOB: 07/12/1981, 35 y.o.   MRN: [id number] Patient admitted to [phone] via bed, escorted by nursing staff.  Patient verbalized understanding of rehab process, signed fall safety agreement.  Appears to be in no immediate distress at this time, other than tachypnea which is baseline per patient.  Dani Gobble, RN

## 2015-12-21 ENCOUNTER — Inpatient Hospital Stay (HOSPITAL_COMMUNITY): Payer: Medicare Other | Admitting: Physical Therapy

## 2015-12-21 ENCOUNTER — Inpatient Hospital Stay (HOSPITAL_COMMUNITY): Payer: Medicare Other

## 2015-12-21 ENCOUNTER — Inpatient Hospital Stay (HOSPITAL_COMMUNITY): Payer: Medicare Other | Admitting: Occupational Therapy

## 2015-12-21 DIAGNOSIS — N12 Tubulo-interstitial nephritis, not specified as acute or chronic: Secondary | ICD-10-CM | POA: Diagnosis present

## 2015-12-21 DIAGNOSIS — I5023 Acute on chronic systolic (congestive) heart failure: Secondary | ICD-10-CM | POA: Diagnosis present

## 2015-12-21 DIAGNOSIS — D649 Anemia, unspecified: Secondary | ICD-10-CM | POA: Diagnosis present

## 2015-12-21 DIAGNOSIS — D638 Anemia in other chronic diseases classified elsewhere: Secondary | ICD-10-CM

## 2015-12-21 DIAGNOSIS — F3170 Bipolar disorder, currently in remission, most recent episode unspecified: Secondary | ICD-10-CM | POA: Diagnosis present

## 2015-12-21 DIAGNOSIS — R5381 Other malaise: Secondary | ICD-10-CM

## 2015-12-21 DIAGNOSIS — E1165 Type 2 diabetes mellitus with hyperglycemia: Secondary | ICD-10-CM | POA: Diagnosis present

## 2015-12-21 DIAGNOSIS — Z794 Long term (current) use of insulin: Secondary | ICD-10-CM

## 2015-12-21 DIAGNOSIS — G722 Myopathy due to other toxic agents: Secondary | ICD-10-CM

## 2015-12-21 DIAGNOSIS — E118 Type 2 diabetes mellitus with unspecified complications: Secondary | ICD-10-CM

## 2015-12-21 LAB — FANA STAINING PATTERNS: Nucleolar Pattern: 1:1280 {titer}

## 2015-12-21 LAB — GLUCOSE, CAPILLARY
GLUCOSE-CAPILLARY: 206 mg/dL — AB (ref 65–99)
Glucose-Capillary: 150 mg/dL — ABNORMAL HIGH (ref 65–99)
Glucose-Capillary: 224 mg/dL — ABNORMAL HIGH (ref 65–99)
Glucose-Capillary: 263 mg/dL — ABNORMAL HIGH (ref 65–99)

## 2015-12-21 LAB — T3, FREE: T3 FREE: 2.2 pg/mL (ref 2.0–4.4)

## 2015-12-21 LAB — ANTINUCLEAR ANTIBODIES, IFA: ANTINUCLEAR ANTIBODIES, IFA: POSITIVE — AB

## 2015-12-21 MED ORDER — PREDNISONE 5 MG PO TABS: 30.0000 mg | ORAL_TABLET | Freq: Two times a day (BID) | ORAL | Status: DC

## 2015-12-21 MED ORDER — CAMPHOR-MENTHOL 0.5-0.5 % EX LOTN
TOPICAL_LOTION | Freq: Two times a day (BID) | CUTANEOUS | Status: DC
  Administered 2015-12-21 – 2015-12-27 (×11): via TOPICAL
  Filled 2015-12-21: qty 222

## 2015-12-21 MED ORDER — PREDNISONE 5 MG PO TABS: 10.0000 mg | ORAL_TABLET | Freq: Every day | ORAL | Status: DC

## 2015-12-21 MED ORDER — PREDNISONE 20 MG PO TABS
20.0000 mg | ORAL_TABLET | Freq: Every day | ORAL | Status: DC
Start: 2015-12-26 — End: 2015-12-21

## 2015-12-21 MED ORDER — MAGNESIUM HYDROXIDE 400 MG/5ML PO SUSP
30.0000 mL | Freq: Once | ORAL | Status: AC
  Administered 2015-12-21: 30 mL via ORAL
  Filled 2015-12-21: qty 30

## 2015-12-21 MED ORDER — PREDNISONE 20 MG PO TABS
30.0000 mg | ORAL_TABLET | Freq: Two times a day (BID) | ORAL | Status: AC
  Administered 2015-12-21 – 2015-12-25 (×9): 30 mg via ORAL
  Filled 2015-12-21 (×9): qty 1

## 2015-12-21 MED ORDER — INSULIN GLARGINE 100 UNIT/ML ~~LOC~~ SOLN
10.0000 [IU] | Freq: Every day | SUBCUTANEOUS | Status: DC
  Administered 2015-12-21 – 2015-12-22 (×2): 10 [IU] via SUBCUTANEOUS
  Filled 2015-12-21 (×3): qty 0.1

## 2015-12-21 MED ORDER — PREDNISONE 20 MG PO TABS
20.0000 mg | ORAL_TABLET | Freq: Every day | ORAL | Status: DC
  Administered 2015-12-26: 20 mg via ORAL
  Filled 2015-12-21: qty 1

## 2015-12-21 MED ORDER — PREDNISONE 20 MG PO TABS: 20.0000 mg | ORAL_TABLET | Freq: Two times a day (BID) | ORAL | Status: DC

## 2015-12-21 MED ORDER — PREDNISONE 5 MG PO TABS: 10.0000 mg | ORAL_TABLET | Freq: Two times a day (BID) | ORAL | Status: DC

## 2015-12-21 MED ORDER — PREDNISONE 5 MG PO TABS
30.0000 mg | ORAL_TABLET | Freq: Every day | ORAL | Status: DC
  Administered 2015-12-26 – 2015-12-27 (×2): 30 mg via ORAL
  Filled 2015-12-21 (×2): qty 1

## 2015-12-21 MED ORDER — PREDNISONE 20 MG PO TABS
20.0000 mg | ORAL_TABLET | Freq: Every day | ORAL | Status: DC
Start: 2016-01-01 — End: 2015-12-27

## 2015-12-21 MED ORDER — POLYETHYLENE GLYCOL 3350 17 G PO PACK
17.0000 g | PACK | Freq: Every day | ORAL | Status: DC
  Administered 2015-12-21 – 2015-12-24 (×3): 17 g via ORAL
  Filled 2015-12-21 (×5): qty 1

## 2015-12-21 NOTE — Evaluation (Signed)
Occupational Therapy Assessment and Plan  Patient Details  Name: Makayla Vasquez MRN: [id number] Date of Birth: 01-01-1981  OT Diagnosis: ataxia and muscle weakness (generalized) Rehab Potential: Rehab Potential (ACUTE ONLY): Excellent ELOS: 10-14 days   Today's Date: 12/21/2015 OT Individual Time: 2620-3559 OT Individual Time Calculation (min): 77 min     Problem List:  Patient Active Problem List   Diagnosis Date Noted  . Pyelonephritis   . Acute on chronic systolic CHF (congestive heart failure) ([place])   . Type 2 diabetes mellitus with complication, with long-term current use of insulin ([place])   . Anemia of chronic disease   . Bipolar affective disorder in remission ([place])   . Debility 12/20/2015  . Acute respiratory failure ([place])   . Generalized weakness   . Lower extremity weakness   . Acute on chronic congestive heart failure ([place])   . AV block   . Acute blood loss anemia   . Supplemental oxygen dependent   . Tachypnea   . Critical illness neuropathy ([place])   . Steroid myopathy   . HCAP (healthcare-associated pneumonia) 12/18/2015  . Acute pyelonephritis   . Palpitations   . Complete heart block ([place])   . Cardiac pacemaker in situ   . UTI (urinary tract infection) with pyuria   . Diarrhea 12/14/2015  . Viral gastroenteritis 12/14/2015  . Lower limb pain, anterior   . Calf pain   . Adrenal insufficiency ([place])   . Acute on chronic diastolic CHF (congestive heart failure) ([place])   . Arterial hypotension   . SOB (shortness of breath)   . Lupus (systemic lupus erythematosus) ([place])   . Congenital heart block   . Chronic diastolic congestive heart failure ([place])   . Hypotension 07/08/2015  . Fatigue 06/05/2015  . Nausea with vomiting 06/05/2015  . Hyperlipidemia associated with type 2 diabetes mellitus ([place]) 05/04/2015  . Polyarthralgia 04/26/2015  . Rhinosinusitis 04/19/2015  . Screening for cervical cancer 01/19/2015  . Onychomycosis of right great toe 12/14/2014  .  Discoloration of skin 12/01/2014  . Lip abscess 11/02/2014  . CHF (congestive heart failure) ([place])   . Diabetes mellitus type 2 in obese ([place])   . Hyperglycemia 10/24/2014  . Dehydration   . Sinus tachycardia ([place])   . Chronic respiratory failure with hypoxia ([place]) 08/07/2014  . Atrial tachycardia ([place]) 07/26/2014  . Insertion of implantable subdermal contraceptive 07/12/2014  . Acute respiratory failure with hypoxia ([place]) 06/19/2014  . ILD (interstitial lung disease) ([place]) 06/19/2014  . Pulmonary infiltrates 06/17/2014  . Dyspnea 06/16/2014  . Hypoxemia 06/16/2014  . Congestive dilated cardiomyopathy ([place]) 05/31/2014  . Nonallergic rhinitis 05/22/2014  . Vaginal itching 05/22/2014  . Pneumonia 05/09/2014  . Shortness of breath 05/08/2014  . Sterilization consult 04/20/2014  . Breast tenderness 01/18/2014  . Carpal tunnel syndrome 09/10/2012  . Constipation 03/03/2012  . Chronic systolic heart failure ([place]) 02/26/2012  . Chest pain, unspecified 12/29/2011  . Post-nasal drip 10/24/2011  . Contraception 01/29/2011  . Heart palpitations   . PPM-Medtronic   . Congenital complete AV block   . Bipolar affective disorder ([place])   . Obesity   . GERD (gastroesophageal reflux disease)   . Hypertension 01/05/2011  . THYROGLOSSAL DUCT CYST 07/18/2010  . UNSPECIFIED CARDIAC DYSRHYTHMIA 05/07/2010  . RH FACTOR, NEGATIVE 04/08/2010  . Mild intermittent asthma without complication 74/16/3845  . 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 HEART BLOCK 12/17/2006  . IRRITABLE BOWEL SYNDROME 12/17/2006    Past Medical History:  Past Medical History  Diagnosis Date  . Cardiac pacemaker     a. Since age 35 in 85. b. Upgrade to BiV in 2013.  . Congenital complete AV block   . Obesity   . GERD (gastroesophageal reflux disease)   . Asymptomatic LV dysfunction     a. Echo in Dec 2011 with EF 35 to 40%. Felt to be due to paced rhythm. b. EF 25-30% in 07/2014.  . Seizures ([place])     as a child- from  high fever  . Anxiety   . Bipolar affective disorder ([place])   . Depression     bipolar  . Carpal tunnel syndrome of right wrist   . Asthma     seasonal allergies   . Arthritis     rheumatoid arthritis- mild, no rheumatology care   . Hypertension   . Pneumonitis     a. a/w hypoxia - inflammatory - large workup 07/2014.  [name] Sinus tachycardia ([place])   . Diabetes mellitus without complication ([place])   . Presence of permanent cardiac pacemaker   . CHF (congestive heart failure) ([place])   . Lupus ([place])   . Lupus (systemic lupus erythematosus) ([place])    Past Surgical History:  Past Surgical History  Procedure Laterality Date  . Throat surgery  1994    s/p laser treatment  . Cesarean section    . Insert / replace / remove pacemaker      2001  . Cholecystectomy    . Iud removal  11/03/2011    Procedure: INTRAUTERINE DEVICE (IUD) REMOVAL;  Surgeon: [name], MD;  Location: [facility];  Service: Gynecology;  Laterality: N/A;  . Cyst excision  12/10/2012    THYROID  . Thyroglossal duct cyst N/A 12/10/2012    Procedure: REVISION OF THYROGLOSSAL DUCT CYST EXCISION;  Surgeon: [name], MD;  Location: [facility];  Service: ENT;  Laterality: N/A;  Revision of Thyroglossal Duct Cyst Excision  . Nasal fracture surgery      /w plate   . Carpal tunnel with cubital tunnel Right 07/26/2013    Procedure: RIGHT LIMITED OPEN CARPAL TUNNEL RELEASE ,  RIGHT CUBITAL TUNNEL RELEASE, INSITU VERSES ULNAR NERVE DECOMPRESSION AND ANTERIOR TRANSPOSITION;  Surgeon: [name], MD;  Location: [facility];  Service: Orthopedics;  Laterality: Right;  . Video bronchoscopy Bilateral 06/19/2014    Procedure: VIDEO BRONCHOSCOPY WITHOUT FLUORO;  Surgeon: Brand Males, MD;  Location: [facility];  Service: Cardiopulmonary;  Laterality: Bilateral;  . Bi-ventricular pacemaker upgrade N/A 03/08/2012    Procedure: BI-VENTRICULAR PACEMAKER UPGRADE;  Surgeon: [name], MD;  Location: [facility] CATH LAB;  Service: Cardiovascular;   Laterality: N/A;  . Atrial tach ablation N/A 08/14/2014    Procedure: ATRIAL TACH ABLATION;  Surgeon: [name], MD;  Location: [facility] CATH LAB;  Service: Cardiovascular;  Laterality: N/A;  . Right heart catheterization N/A 10/26/2014    Procedure: RIGHT HEART CATH;  Surgeon: [name], MD;  Location: [facility] CATH LAB;  Service: Cardiovascular;  Laterality: N/A;    Assessment & Plan Clinical Impression: Patient is a 35 y.o. 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 She was placed on IV antibiotics and treated with gentle diuresis. Cardiac enzymes noted to be mildly elevated due to demand  ischemia and Dr. Titus Mould consulted for input on dyspnea and recommended stress dose steroids to help with symptoms. Patient to continue on slow steroid taper and antibiotics narrowed to treat UTI/Pyel ad PCCM doubts HCAP. Respiratory status is improving but she continues to be limited by BLE weakness and fatigue.    Patient transferred to CIR on 12/20/2015.    Patient currently requires min-mod assist with basic self-care skills secondary to decreased cardiorespiratoy endurance and unbalanced muscle activation and ataxia.  Prior to hospitalization, patient could complete BADL independently, iADL with mod.  Patient will benefit from skilled intervention to increase independence with basic self-care skills and increase level of independence with iADL prior to discharge home with care partner.  Anticipate patient will require intermittent supervision and follow up home health.  OT - End of Session Endurance Deficit: Yes OT Assessment Rehab Potential (ACUTE ONLY): Excellent OT Patient demonstrates impairments in the following area(s):  Balance;Endurance;Safety OT Basic ADL's Functional Problem(s): Bathing;Dressing;Toileting OT Advanced ADL's Functional Problem(s): Simple Meal Preparation;Laundry;Light Housekeeping OT Transfers Functional Problem(s): Toilet;Tub/Shower OT Additional Impairment(s): Other (comment) (BUE weakness) OT Plan OT Intensity: Minimum of 1-2 x/day, 45 to 90 minutes OT Frequency: 5 out of 7 days OT Duration/Estimated Length of Stay: 10-14 days OT Treatment/Interventions: Balance/vestibular training;Discharge planning;UE/LE Coordination activities;UE/LE Strength taining/ROM;Therapeutic Exercise;Therapeutic Activities;Self Care/advanced ADL retraining;Patient/family education;Functional mobility training;DME/adaptive equipment instruction OT Self Feeding Anticipated Outcome(s): Mod I OT Basic Self-Care Anticipated Outcome(s): Mod I OT Toileting Anticipated Outcome(s): Mod I OT Bathroom Transfers Anticipated Outcome(s): Supervision OT Recommendation Patient destination: Home Follow Up Recommendations: Home health OT Equipment Recommended: To be determined  Skilled Therapeutic Intervention OT 1:1 initial evaluation completed with treatment to address functional transfers, bed mobility, effective use of DME (hospital bed, w/c, rails, grab bars).   Pt able to complete bed mobility with extra time but was noted with discharge/bleeding after lateral scoot to w/c.  Pt reported hemorrhoid f/u however OT contacted RN d/t presence of blood on sheets and bedding.   Per RN/PA consult, suspect menstruation cycle was provoked by meds provided during admission although pt reports irregular cycles since IUD placed 2015.  Pt able to complete only squat pivot transfers initially d/t BLE weakness.   Pt performed bathing seated at sink; no clothing available.  Pt able to rise to stand supported at sink with max assist to lift but could maintain her balance and elected to attempt ambulation back to bed with RW; completed mobility  and transfer with just steadying assist.   OT Evaluation Precautions/Restrictions  Precautions Precautions: Fall;ICD/Pacemaker Restrictions Weight Bearing Restrictions: No  General Chart Reviewed: Yes Family/Caregiver Present: No  Vital Signs Therapy Vitals Temp: 97.8 F (36.6 C) Temp Source: Oral Pulse Rate: 66 Resp: 18 BP: (!) 101/44 mmHg Patient Position (if appropriate): Lying Oxygen Therapy SpO2: 100 % O2 Device: Nasal Cannula O2 Flow Rate (L/min): 4 L/min  Pain Pain Assessment Pain Assessment: No/denies pain  Home Living/Prior Functioning Home Living Family/patient expects to be discharged to:: Private residence Living Arrangements: Spouse/significant other, Children Available Help at Discharge: Family, Available 24 hours/day Type of Home: Apartment Home Access: Stairs to enter [employer] of Steps: 2 short steps Entrance Stairs-Rails: Left Home Layout: One level Bathroom Shower/Tub: Chiropodist: Standard Additional Comments: Intends to return to her apartment  Lives With: Family (Boyfriend (of 65 years) and son) IADL History Homemaking Responsibilities: Yes Meal Prep Responsibility: Secondary Laundry Responsibility: Secondary Cleaning Responsibility: Primary Bill Paying/Finance Responsibility: Primary Shopping Responsibility: Primary Child Care Responsibility: Primary Homemaking  Comments: stopped cooking 2 months, prior only cooked on weekends: boyfriend does mela prep weekdays Current License: Yes Mode of Transportation:  (No car) Education: HS and some college 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 Leisure and Hobbies: Home, play with son  ADL ADL ADL Comments: See Functional Assessment Tool  Vision/Perception  Vision- History Baseline Vision/History: Wears glasses (Not available during eval (glasses at home)) Wears Glasses: At all times Patient Visual Report: No change from baseline    Cognition Arousal/Alertness: Awake/alert Orientation Level: Person;Place;Situation Person: Oriented Place: Oriented Situation: Oriented Year: 2017 Month: April Day of Week: Correct Memory: Appears intact Immediate Memory Recall: Sock;Blue;Bed Memory Recall: Sock;Blue;Bed Memory Recall Sock: With Cue Memory Recall Blue: Without Cue Memory Recall Bed: Without Cue Attention: Selective Selective Attention: Appears intact Awareness: Appears intact Safety/Judgment: Appears intact  Sensation Sensation Light Touch: Impaired by gross assessment (reports intermittent paresthesia, B-U/LE, distally) Stereognosis: Appears Intact Hot/Cold: Appears Intact Proprioception: Appears Intact Coordination Gross Motor Movements are Fluid and Coordinated: Yes (@ BUE) Fine Motor Movements are Fluid and Coordinated: Yes (@ BUE) Finger Nose Finger Test: WFL, eyes closed  Motor  Motor Motor: Other (comment) (Generalized weakness) Motor - Skilled Clinical Observations: Quesiton possible ataxia at LE during functional mobility  Mobility  Bed Mobility Bed Mobility: Supine to Sit;Sit to Supine Supine to Sit: 4: Min assist;With rails Supine to Sit Details: Verbal cues for technique;Verbal cues for sequencing;Visual cues/gestures for sequencing Supine to Sit Details (indicate cue type and reason): Patient utilized bed rail for all bed mobility Sit to Supine: 4: Min assist Sit to Supine - Details: Tactile cues for posture;Tactile cues for sequencing;Verbal cues for technique;Verbal cues for sequencing Sit to Supine - Details (indicate cue type and reason): Heavy use of bed rail. cues for improved technique and sequencing.  Transfers Sit to Stand: 2: Max assist Sit to Stand Details: Tactile cues for placement;Manual facilitation for placement;Manual facilitation for weight bearing;Verbal cues for technique;Verbal cues for sequencing Stand to Sit: 3: Mod assist Stand to Sit Details (indicate cue type  and reason): Verbal cues for technique;Verbal cues for sequencing;Verbal cues for safe use of DME/AE;Manual facilitation for placement   Trunk/Postural Assessment  Cervical Assessment Cervical Assessment: Within Functional Limits Thoracic Assessment Thoracic Assessment: Within Functional Limits Lumbar Assessment Lumbar Assessment: Within Functional Limits Postural Control Postural Control: Deficits on evaluation (Decreased righting reactions and activaiton of postural muscles)   Balance Balance Balance Assessed: Yes Static Sitting Balance Static Sitting - Level of Assistance: 5: Stand by assistance Static Sitting - Comment/# of Minutes: 5 Dynamic Sitting Balance Dynamic Sitting - Level of Assistance: 5: Stand by assistance Dynamic Sitting Balance - Compensations:  (Use of hands to correct LOB .) Static Standing Balance Static Standing - Balance Support: Bilateral upper extremity supported Static Standing - Level of Assistance: 4: Min assist Static Standing - Comment/# of Minutes: 2   Extremity/Trunk Assessment RUE Assessment RUE Assessment: Exceptions to [facility] RUE Strength RUE Overall Strength: Deficits (4/5 grossly) LUE Assessment LUE Assessment: Exceptions to [facility] LUE Strength LUE Overall Strength: Deficits (4/5, grossly)   See Function Navigator for Current Functional Status.   Refer to Care Plan for Long Term Goals  Recommendations for other services: None  Discharge Criteria: Patient will be discharged from OT if patient refuses treatment 3 consecutive times without medical reason, if treatment goals not met, if there is a change in medical status, if patient makes no progress towards goals or if patient is discharged from hospital.  The above assessment, treatment plan, treatment alternatives and goals were discussed and mutually agreed upon: by patient   Second session: Time: 1300-1330 Time Calculation (min):  30  min  Pain Assessment:  No/denies pain  Skilled  Therapeutic Interventions: Therapeutic exercise with focus on BUE HEP sitting unsupported at EOB.   OT provided initial instruction with demonstration using grades 2 and 3 band.  Pt required mod instructional cues and hand-over-hand guidance to perform each exercise correctly for 4-5 reps each.    Reinforcement training required.   Overall BUE strength only 4/5; AROM appears WFL.      See FIM for current functional status  Therapy/Group: Individual Therapy [place] 12/21/2015, 2:35 PM

## 2015-12-21 NOTE — IPOC Note (Signed)
Overall Plan of Care [facility]) Patient Details Name: Makayla Vasquez MRN: [id number] DOB: 1981-07-14  Admitting Diagnosis: debility  Hospital Problems: Active Problems:   Debility   Pyelonephritis   Acute on chronic systolic CHF (congestive heart failure) (HCC)   Type 2 diabetes mellitus with complication, with long-term current use of insulin (HCC)   Anemia of chronic disease   Bipolar affective disorder in remission [facility])     Functional Problem List: Nursing Bowel, Endurance, Medication Management, Motor, Safety, Skin Integrity, Pain  PT Balance, Endurance, Motor, Pain, Safety, Sensory  OT Balance, Endurance, Safety  SLP    TR         Basic ADL's: OT Bathing, Dressing, Toileting     Advanced  ADL's: OT Simple Meal Preparation, Laundry, Light Housekeeping     Transfers: PT Bed Mobility, Bed to Chair, Car, [employer], Civil Service fast streamer, Tub/Shower     Locomotion: PT Ambulation, Psychologist, prison and probation services, Stairs     Additional Impairments: OT Other (comment) (BUE weakness)  SLP        TR      Anticipated Outcomes Item Anticipated Outcome  Self Feeding Mod I  Swallowing      Basic self-care  Mod I  Toileting  Mod I   Bathroom Transfers Supervision  Bowel/Bladder  Min assist  Transfers  Supervision LRAD.   Locomotion  Supervision with gait for home distances and limited community ambulation. WC for community mobilty.   Communication     Cognition     Pain  < 3  Safety/Judgment  Mod I   Therapy Plan: PT Intensity: Minimum of 1-2 x/day ,45 to 90 minutes PT Frequency: 5 out of 7 days PT Duration Estimated Length of Stay: 10 to 14 days  OT Intensity: Minimum of 1-2 x/day, 45 to 90 minutes OT Frequency: 5 out of 7 days OT Duration/Estimated Length of Stay: 10-14 days         Team Interventions: Nursing Interventions Patient/Family Education, Pain Management, Medication Management, Skin Care/Wound Management, Bowel Management, Disease Management/Prevention   PT interventions Ambulation/gait training, Warden/ranger, Community reintegration, Discharge planning, DME/adaptive equipment instruction, Functional mobility training, Neuromuscular re-education, Pain management, Patient/family education, Stair training, Therapeutic Activities, Therapeutic Exercise, UE/LE Strength taining/ROM, UE/LE Coordination activities, Wheelchair propulsion/positioning  OT Interventions Warden/ranger, Discharge planning, UE/LE Coordination activities, UE/LE Strength taining/ROM, Therapeutic Exercise, Therapeutic Activities, Self Care/advanced ADL retraining, Patient/family education, Functional mobility training, DME/adaptive equipment instruction  SLP Interventions    TR Interventions    SW/CM Interventions Discharge Planning, Psychosocial Support, Patient/Family Education    Team Discharge Planning: Destination: PT-Home ,OT- Home , SLP-  Projected Follow-up: PT-Home health PT, OT-  Home health OT, SLP-  Projected Equipment Needs: PT-Rolling walker with 5" wheels, Wheelchair (measurements), Wheelchair cushion (measurements), To be determined, OT- To be determined, SLP-  Equipment Details: PT-RW for home ambulation. WC for commnuity mobilty. , OT-  Patient/family involved in discharge planning: PT- Patient,  OT-Patient, SLP-   MD ELOS: 12-15 days. 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 She was readmitted the next day on 02/28 with leucocytosis, BLE weakness and SOB due to acute on chronic CHF and HCAP. She was placed on IV antibiotics and treated with gentle diuresis. Cardiac  enzymes noted to be mildly elevated due to demand ischemia and Dr.  Tyson Alias consulted for input on dyspnea and recommended stress dose steroids to help with symptoms. Patient to continue on slow steroid taper and antibiotics narrowed to treat UTI/Pyel ad PCCM doubts HCAP. Respiratory status is improving but she continues to be limited by BLE weakness and fatigue.   See Team Conference Notes for weekly updates to the plan of care

## 2015-12-21 NOTE — Progress Notes (Signed)
PHYSICAL MEDICINE & REHABILITATION     PROGRESS NOTE  Subjective/Complaints:  Pt laying in bed this AM.  She had a good first night and is ready for therarpies to begin.   ROS: Denies CP, SOB, n/v/d.  Objective: Vital Signs: Blood pressure 101/44, pulse 66, temperature 97.8 F (36.6 C), temperature source Oral, resp. rate 18, height 5\' 5"  (1.651 m), weight 76.204 kg (168 lb), SpO2 100 %. No results found.  Recent Labs  12/18/15 1445 12/19/15 0702  WBC 12.9* 8.5  HGB 9.7* 8.9*  HCT 32.1* 29.2*  PLT 352 265    Recent Labs  12/18/15 1445 12/19/15 0702  NA 141 137  K 3.4* 4.0  CL 106 100*  GLUCOSE 97 237*  BUN 9 12  CREATININE 0.57 0.71  CALCIUM 7.6* 7.6*   CBG (last 3)   Recent Labs  12/20/15 1722 12/20/15 2052 12/21/15 0640  GLUCAP 233* 196* 206*    Wt Readings from Last 3 Encounters:  12/21/15 76.204 kg (168 lb)  12/20/15 77.61 kg (171 lb 1.6 oz)  12/17/15 78.5 kg (173 lb 1 oz)    Physical Exam:  BP 101/44 mmHg  Pulse 66  Temp(Src) 97.8 F (36.6 C) (Oral)  Resp 18  Ht 5\' 5"  (1.651 m)  Wt 76.204 kg (168 lb)  BMI 27.96 kg/m2  SpO2 100% Constitutional: She appears well-developed and well-nourished. NAD. Vital signs reviewed.  HENT: Normocephalic and atraumatic.  Mouth/Throat: Oropharynx is clear and moist. Dental caries present.  Eyes: Conjunctivae and EOM are normal. Neck: Normal range of motion. Neck supple.  Cardiovascular: Normal rate and regular rhythm.  Respiratory: Effort normal. No accessory muscle usage. No respiratory distress. She has decreased breath sounds in the right lower field.  GI: Soft. Bowel sounds are normal. She exhibits no distension. There is no tenderness.  Musculoskeletal: She exhibits no edema or tenderness.  Has bilateral thigh discomfort with ROM bilateral knee due to contractures. Bilateral hands with evidence of muscle wasting.  Neurological: She is alert and oriented.  Speech clear and able to follow  basic commands without difficulty.  Motor: Proximally UE/LE 3-/5, distally 4+/5 Skin: Skin is warm and dry.  Psychiatric: She has a normal mood and affect. Her speech is normal and behavior is normal. Thought content normal. Her mood appears not anxious.    Assessment/Plan: 1. 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 Physiatrist is providing close team supervision and 24 hour management of active medical problems listed below. Physiatrist and rehab team continue to assess barriers to discharge/monitor patient progress toward functional and medical goals.  Function:  Bathing Bathing position      Bathing parts      Bathing assist        Upper Body Dressing/Undressing Upper body dressing                    Upper body assist        Lower Body Dressing/Undressing Lower body dressing                                  Lower body assist        Toileting Toileting          Toileting assist     Transfers Chair/bed transfer  Secondary school teacher Comprehension    Expression    Social Interaction    Problem Solving    Memory      Medical Problem List and Plan: 1. 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 DVT Prophylaxis/Anticoagulation: Pharmaceutical: Lovenox 3. Pain Management: Cont meds PRN 4. Mood: Team to provide ego support. LCSW to follow for evaluation and support.  5. Neuropsych: This patient is capable of making decisions on her own behalf. 6. Skin/Wound Care: Routine pressure relief measures. Maintain adequate nutrition and hydration.  7. Fluids/Electrolytes/Nutrition: Monitor strict I/O. Check weights daily.  8. Pyelonephritis: to continue Keflex thorough 3/9 to complete  antibiotic course.  9. 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  10 Acute on chronic systolic CHF: Compensated--check daily weights. Continue lasix 20 mg/day. 11. Lupus: On Cellcept and stress dose steroids at this time.  12. DMT2: Resumed lantus at 5 units/HS and titrate to home dose.   Monitor BS with ac/hs checks and use SSI for now.   Anticipate BS to start trending up with increase in steroids.   Dietician to educate patient on CM diet.  13. Bipolar disorder with panic attacks: Was on Abilify years ago and most recently has used service dog to help manage symptoms.  14. Anemia of chronic disease?:    Hb 8.9 on 3/1  Will order labs for Monday  LOS (Days) 1 A FACE TO FACE EVALUATION WAS PERFORMED  Makayla Vasquez 12/21/2015 9:59 AM

## 2015-12-21 NOTE — H&P (View-Only) (Signed)
Physical Medicine and Rehabilitation Admission H&P    Chief Complaint  Patient presents with  . Debility.     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 She was readmitted the next day on 02/28 with leucocytosis, BLE weakness and SOB due to acute on chronic CHF and HCAP.  She was placed on IV antibiotics and treated with gentle diuresis. Cardiac enzymes noted to be mildly elevated due to demand ischemia and Dr. Titus Mould consulted for input on dyspnea and recommended stress dose steroids to help with symptoms. Patient to continue on slow steroid taper and antibiotics narrowed to treat UTI/Pyel ad PCCM doubts HCAP. Respiratory status is improving but she continues to be limited by BLE weakness and fatigue. CIR recommended for follow up therapy.   Patient states that over the last 2 months she has been on and off Higher dose steroids. Her baseline dose of steroids is 10 mg of prednisone He denies any back pain. Denies any bladder or bowel issues.  Review of Systems  HENT: Negative for hearing loss and tinnitus.   Eyes: Positive for blurred vision (wears glasses). Negative for double vision.  Respiratory: Negative for cough, sputum production and shortness of breath.   Gastrointestinal: Positive for heartburn and constipation. Negative for nausea and vomiting.  Genitourinary: Negative for dysuria and urgency.  Musculoskeletal: Positive for myalgias. Negative for back pain and joint pain.  Neurological: Positive for dizziness (occasionally), sensory change (numbness on and off in hands and feet.) and focal weakness. Negative for tingling and headaches.  Psychiatric/Behavioral: Negative for depression. The patient  is nervous/anxious (has been having increase in anxiety recently). The patient does not have insomnia.       Past Medical History  Diagnosis Date  . Cardiac pacemaker     a. Since age 59 in 71. b. Upgrade to BiV in 2013.  . Congenital complete AV block   . Obesity   . GERD (gastroesophageal reflux disease)   . Asymptomatic LV dysfunction     a. Echo in Dec 2011 with EF 35 to 40%. Felt to be due to paced rhythm. b. EF 25-30% in 07/2014.  . Seizures ([place])     as a child- from high fever  . Anxiety   . Bipolar affective disorder ([place])   . Depression     bipolar  . Carpal tunnel syndrome of right wrist   . Asthma     seasonal allergies   . Arthritis     rheumatoid arthritis- mild, no rheumatology care   . Hypertension   . Pneumonitis     a. a/w hypoxia - inflammatory - large workup 07/2014.  [name] Sinus tachycardia ([place])   . Diabetes mellitus without complication ([place])   . Presence of permanent cardiac pacemaker   . CHF (congestive heart failure) ([place])   . Lupus ([place])   . Lupus (systemic lupus erythematosus) ([place])     Past Surgical History  Procedure Laterality Date  . Throat surgery  1994    s/p laser treatment  . Cesarean section    . Insert / replace / remove pacemaker      2001  . Cholecystectomy    . Iud removal  11/03/2011    Procedure:  INTRAUTERINE DEVICE (IUD) REMOVAL;  Surgeon: [name], MD;  Location: [facility];  Service: Gynecology;  Laterality: N/A;  . Cyst excision  12/10/2012    THYROID  . Thyroglossal duct cyst N/A 12/10/2012    Procedure: REVISION OF THYROGLOSSAL DUCT CYST EXCISION;  Surgeon: [name], MD;  Location: [place];  Service: ENT;  Laterality: N/A;  Revision of Thyroglossal Duct Cyst Excision  . Nasal fracture surgery      /w plate   . Carpal tunnel with cubital tunnel Right 07/26/2013    Procedure: RIGHT LIMITED OPEN CARPAL TUNNEL RELEASE ,  RIGHT CUBITAL TUNNEL RELEASE, INSITU VERSES ULNAR NERVE DECOMPRESSION AND ANTERIOR TRANSPOSITION;   Surgeon: [name], MD;  Location: [facility];  Service: Orthopedics;  Laterality: Right;  . Video bronchoscopy Bilateral 06/19/2014    Procedure: VIDEO BRONCHOSCOPY WITHOUT FLUORO;  Surgeon: Brand Males, MD;  Location: [facility];  Service: Cardiopulmonary;  Laterality: Bilateral;  . Bi-ventricular pacemaker upgrade N/A 03/08/2012    Procedure: BI-VENTRICULAR PACEMAKER UPGRADE;  Surgeon: [name], MD;  Location: [facility] CATH LAB;  Service: Cardiovascular;  Laterality: N/A;  . Atrial tach ablation N/A 08/14/2014    Procedure: ATRIAL TACH ABLATION;  Surgeon: [name], MD;  Location: [facility] CATH LAB;  Service: Cardiovascular;  Laterality: N/A;  . Right heart catheterization N/A 10/26/2014    Procedure: RIGHT HEART CATH;  Surgeon: [name], MD;  Location: [facility] CATH LAB;  Service: Cardiovascular;  Laterality: N/A;    Family History  Problem Relation Age of Onset  . Heart disease Mother     CHF (no details)  . Hypertension Mother   . Heart disease Father     Murmur  . Heart disease Sister 44     No details.  History of a pacemaker  . Lupus Mother     Social History:  Lives with boyfriend and 58 year old son. Reports that she has been unable to walk for past 4 weeks and been bed bound. Per reports that she quit smoking about 19 years ago. Her smoking use included Cigarettes. She has a .125 pack-year smoking history. She has never used smokeless tobacco. She reports that she does not drink alcohol or use illicit drugs.    Allergies  Allergen Reactions  . Sertraline Hcl Hives  . Tape Other (See Comments)    Burns skin    Medications Prior to Admission  Medication Sig Dispense Refill  . albuterol (PROVENTIL HFA;VENTOLIN HFA) 108 (90 BASE) MCG/ACT inhaler Inhale 2 puffs into the lungs every 4 (four) hours as needed for wheezing or shortness of breath. 1 each 1  . atorvastatin (LIPITOR) 40 MG tablet Take 1 tablet (40 mg total) by mouth daily. 90 tablet 3  . bisoprolol (ZEBETA)  5 MG tablet Take 2.5 mg by mouth daily.    . budesonide-formoterol (SYMBICORT) 80-4.5 MCG/ACT inhaler Inhale 2 puffs into the lungs 2 (two) times daily as needed (for shortness of breath).     . calcium-vitamin D (OSCAL WITH D) 500-200 MG-UNIT per tablet Take 1 tablet by mouth daily with breakfast.     . cephALEXin (KEFLEX) 500 MG capsule Take 1 capsule (500 mg total) by mouth every 6 (six) hours. 36 capsule 0  . etonogestrel (NEXPLANON) 68 MG IMPL implant 1 each by Subdermal route once.    . fluocinonide (LIDEX) 0.05 % external solution 1 APPLICATION APPLY ON THE SKIN AS DIRECTED APPLY TO AFFECTED AREAS DAILY ON SCALP AS NEEDED FOR ITCHING.  5  .  furosemide (LASIX) 20 MG tablet Take 20 mg by mouth daily.  5  . Insulin Glargine (LANTUS SOLOSTAR) 100 UNIT/ML Solostar Pen Inject 35 Units into the skin daily at 10 pm. (Patient taking differently: Inject 35 Units into the skin daily as needed (low blood sugar). ) 15 mL 5  . metFORMIN (GLUCOPHAGE) 1000 MG tablet TAKE 1 TABLET BY MOUTH TWICE A DAY WITH A MEAL 30 tablet 11  . mycophenolate (CELLCEPT) 500 MG tablet Take 500 mg by mouth 2 (two) times daily.    . Nutritional Supplements (GLUCERNA ADVANCE SHAKE) LIQD Take supplement daily in place of 1 meal. (Patient taking differently: Take 1 Can by mouth daily as needed (meal replacement). Take supplement daily in place of 1 meal.) 30 Bottle 2  . oxyCODONE-acetaminophen (PERCOCET) 5-325 MG tablet Take 1 tablet by mouth every 6 (six) hours as needed for severe pain. 60 tablet 0  . potassium chloride SA (KLOR-CON M20) 20 MEQ tablet Take 1 tablet (20 mEq total) by mouth daily. 30 tablet 2  . Blood Glucose Monitoring Suppl (ONE TOUCH ULTRA 2) W/DEVICE KIT Check blood sugar daily before breakfast 1 each 0  . glucose blood (ONE TOUCH ULTRA TEST) test strip Check blood sugar daily before breakfast 100 each 0  . Insulin Pen Needle (INSUPEN PEN NEEDLES) 32G X 4 MM MISC Use with insulin pen 30 each 0  . predniSONE  (DELTASONE) 10 MG tablet Take 3 tablets for 5 days, then 2 tablets for 5 days, then return 1 tablet daily (Patient not taking: Reported on 12/18/2015) 90 tablet 0    Home: Home Living Family/patient expects to be discharged to:: Private residence Living Arrangements: Spouse/significant other Available Help at Discharge: Family, Available 24 hours/day (boyfriend available 24 hr/day) Type of Home: Apartment Home Access: Stairs to enter [employer] of Steps: 2 short steps Entrance Stairs-Rails: Left Home Layout: One level Bathroom Shower/Tub: Chiropodist: Standard Home Equipment: None   Functional History: Prior Function Level of Independence: Independent, Needs assistance Gait / Transfers Assistance Needed: pt states she needs assist to get out of tub at home, walks on her own typically but has needed boyfriend for all transfers the past 3 weeks ADL's / Homemaking Assistance Needed: pt has 5 y.o. son,boyfriend has been assisting with all ADLs the past few weeks, was independent before Communication / Swallowing Assistance Needed: no difficulties Comments: unless in a lupus flare pt is functionally independent with supplemental O2  Functional Status:  Mobility: Bed Mobility Overal bed mobility: Needs Assistance Bed Mobility: Sidelying to Sit, Supine to Sit, Sit to Supine Rolling: Min assist Sidelying to sit: Mod assist Supine to sit: Mod assist General bed mobility comments: pt pivoted with assit from bed rails, assist needed to bring LEs to EOBa and to bring trunk upright as pt fatigues quickly, benefits from rest breaks Transfers Overall transfer level: Needs assistance Equipment used: 2 person hand held assist Transfers: Sit to/from Stand, Stand Pivot Transfers Sit to Stand: Min assist, +2 physical assistance, +2 safety/equipment Stand pivot transfers: Min assist, +2 physical assistance, +2 safety/equipment General transfer comment: Mod A for  initial boost, pulling up on back of therapists arms, manual cues to facilitate hip extension, once stand took small pivot steps to chair with min A  for knee buckling and cues for slow controlled descent Ambulation/Gait General Gait Details: deferred due to increased in HR and fatigue    ADL: ADL Overall ADL's : Needs assistance/impaired Eating/Feeding: Modified independent, Bed level Grooming:  Wash/dry hands, Wash/dry face, Oral care, Set up, Sitting Upper Body Bathing: Supervision/ safety, Set up, Sitting Upper Body Dressing : Supervision/safety, Set up, Sitting Lower Body Dressing: Maximal assistance, +2 for physical assistance, +2 for safety/equipment, Sit to/from stand Toilet Transfer: Minimal assistance, +2 for physical assistance, +2 for safety/equipment, BSC Toilet Transfer Details (indicate cue type and reason): Pt performed SPT from EOB to 3:1 w/ +1 and Mod A today, if taking any steps, recommend +2 for physical assist and safety secondary to bilateral LE weakness. Fatigues easily Toileting- Clothing Manipulation and Hygiene: Maximal assistance, Sitting/lateral lean, Sit to/from stand Functional mobility during ADLs: Minimal assistance, +2 for physical assistance, +2 for safety/equipment, Cueing for safety, Cueing for sequencing General ADL Comments: Pt was educated in role of OT and then participated in ADL retraining session to consist of bed mobility, SPT to 3:1 for toileting and bathing/grooming. Pt will benefit from in-pt Rehab to assist in maximizing independence with ADL's and self care as well as functional transfers as she was independent until several weeks-1 month ago per her report. Pt also has 21 y/o son.  Cognition: Cognition Overall Cognitive Status: Within Functional Limits for tasks assessed Orientation Level: Oriented X4 Cognition Arousal/Alertness: Awake/alert Behavior During Therapy: Flat affect, WFL for tasks assessed/performed Overall Cognitive Status: Within  Functional Limits for tasks assessed   Blood pressure 97/58, pulse 85, temperature 97.4 F (36.3 C), temperature source Oral, resp. rate 18, height 5' 5"  (1.651 m), weight 77.61 kg (171 lb 1.6 oz), SpO2 95 %. Physical Exam  Nursing note and vitals reviewed. Constitutional: She is oriented to person, place, and time. She appears well-developed and well-nourished.  HENT:  Head: Normocephalic and atraumatic.  Mouth/Throat: Oropharynx is clear and moist. Dental caries present.  Eyes: Conjunctivae are normal. Pupils are equal, round, and reactive to light.  Neck: Normal range of motion. Neck supple.  Cardiovascular: Normal rate and regular rhythm.   Respiratory: Effort normal. No accessory muscle usage. No respiratory distress. She has decreased breath sounds in the right lower field.  GI: Soft. Bowel sounds are normal. She exhibits no distension. There is no tenderness.  Musculoskeletal: She exhibits no edema or tenderness.  Has bilateral thigh discomfort with ROM bilateral knee due to contractures. Sensation appears intact. Bilateral hands with evidence of muscle wasting.   Neurological: She is alert and oriented to person, place, and time.  Speech clear and able to follow basic commands without difficulty. Has basic understanding of her medical illness and has poor insight/awareness/concern regarding current medical issues.    Skin: Skin is warm and dry.  Psychiatric: She has a normal mood and affect. Her speech is normal and behavior is normal. Thought content normal. Her mood appears not anxious.    Results for orders placed or performed during the hospital encounter of 12/18/15 (from the past 48 hour(s))  CBC with Differential     Status: Abnormal   Collection Time: 12/18/15  2:45 PM  Result Value Ref Range   WBC 12.9 (H) 4.0 - 10.5 K/uL   RBC 3.85 (L) 3.87 - 5.11 MIL/uL   Hemoglobin 9.7 (L) 12.0 - 15.0 g/dL   HCT 32.1 (L) 36.0 - 46.0 %   MCV 83.4 78.0 - 100.0 fL   MCH 25.2 (L) 26.0  - 34.0 pg   MCHC 30.2 30.0 - 36.0 g/dL   RDW 18.5 (H) 11.5 - 15.5 %   Platelets 352 150 - 400 K/uL   Neutrophils Relative % 84 %   Lymphocytes  Relative 12 %   Monocytes Relative 4 %   Eosinophils Relative 0 %   Basophils Relative 0 %   Neutro Abs 10.9 (H) 1.7 - 7.7 K/uL   Lymphs Abs 1.5 0.7 - 4.0 K/uL   Monocytes Absolute 0.5 0.1 - 1.0 K/uL   Eosinophils Absolute 0.0 0.0 - 0.7 K/uL   Basophils Absolute 0.0 0.0 - 0.1 K/uL   RBC Morphology POLYCHROMASIA PRESENT   Basic metabolic panel     Status: Abnormal   Collection Time: 12/18/15  2:45 PM  Result Value Ref Range   Sodium 141 135 - 145 mmol/L   Potassium 3.4 (L) 3.5 - 5.1 mmol/L   Chloride 106 101 - 111 mmol/L   CO2 27 22 - 32 mmol/L   Glucose, Bld 97 65 - 99 mg/dL   BUN 9 6 - 20 mg/dL   Creatinine, Ser 0.57 0.44 - 1.00 mg/dL   Calcium 7.6 (L) 8.9 - 10.3 mg/dL   GFR calc non Af Amer >60 >60 mL/min   GFR calc Af Amer >60 >60 mL/min    Comment: (NOTE) The eGFR has been calculated using the CKD EPI equation. This calculation has not been validated in all clinical situations. eGFR's persistently <60 mL/min signify possible Chronic Kidney Disease.    Anion gap 8 5 - 15  Brain natriuretic peptide     Status: Abnormal   Collection Time: 12/18/15  2:45 PM  Result Value Ref Range   B Natriuretic Peptide 345.4 (H) 0.0 - 100.0 pg/mL  Urinalysis, Routine w reflex microscopic     Status: Abnormal   Collection Time: 12/18/15  4:19 PM  Result Value Ref Range   Color, Urine YELLOW YELLOW   APPearance CLOUDY (A) CLEAR   Specific Gravity, Urine 1.015 1.005 - 1.030   pH 7.0 5.0 - 8.0   Glucose, UA NEGATIVE NEGATIVE mg/dL   Hgb urine dipstick TRACE (A) NEGATIVE   Bilirubin Urine NEGATIVE NEGATIVE   Ketones, ur NEGATIVE NEGATIVE mg/dL   Protein, ur NEGATIVE NEGATIVE mg/dL   Nitrite NEGATIVE NEGATIVE   Leukocytes, UA NEGATIVE NEGATIVE  Urine culture     Status: None   Collection Time: 12/18/15  4:19 PM  Result Value Ref Range    Specimen Description URINE, CLEAN CATCH    Special Requests NONE    Culture      MULTIPLE SPECIES PRESENT, SUGGEST RECOLLECTION Performed at [facility]    Report Status 12/20/2015 FINAL   Urine microscopic-add on     Status: Abnormal   Collection Time: 12/18/15  4:19 PM  Result Value Ref Range   Squamous Epithelial / LPF 6-30 (A) NONE SEEN   WBC, UA 0-5 0 - 5 WBC/hpf   RBC / HPF 0-5 0 - 5 RBC/hpf   Bacteria, UA FEW (A) NONE SEEN  Troponin I     Status: Abnormal   Collection Time: 12/18/15  7:30 PM  Result Value Ref Range   Troponin I 0.05 (H) <0.031 ng/mL    Comment:        PERSISTENTLY INCREASED TROPONIN VALUES IN THE RANGE OF 0.04-0.49 ng/mL CAN BE SEEN IN:       -UNSTABLE ANGINA       -CONGESTIVE HEART FAILURE       -MYOCARDITIS       -CHEST TRAUMA       -ARRYHTHMIAS       -LATE PRESENTING MYOCARDIAL INFARCTION       -COPD   CLINICAL FOLLOW-UP RECOMMENDED.  Glucose, capillary     Status: None   Collection Time: 12/18/15  7:37 PM  Result Value Ref Range   Glucose-Capillary 73 65 - 99 mg/dL  Culture, blood (routine x 2)     Status: None (Preliminary result)   Collection Time: 12/18/15 10:44 PM  Result Value Ref Range   Specimen Description BLOOD LEFT HAND    Special Requests BOTTLES DRAWN AEROBIC AND ANAEROBIC 10CC     Culture NO GROWTH 1 DAY    Report Status PENDING   Lactic acid, plasma     Status: None   Collection Time: 12/18/15 11:40 PM  Result Value Ref Range   Lactic Acid, Venous 1.2 0.5 - 2.0 mmol/L  Culture, blood (routine x 2)     Status: None (Preliminary result)   Collection Time: 12/18/15 11:50 PM  Result Value Ref Range   Specimen Description BLOOD RIGHT HAND    Special Requests BOTTLES DRAWN AEROBIC ONLY 10CC    Culture NO GROWTH 1 DAY    Report Status PENDING   Troponin I     Status: Abnormal   Collection Time: 12/19/15  1:01 AM  Result Value Ref Range   Troponin I 0.05 (H) <0.031 ng/mL    Comment:        PERSISTENTLY INCREASED  TROPONIN VALUES IN THE RANGE OF 0.04-0.49 ng/mL CAN BE SEEN IN:       -UNSTABLE ANGINA       -CONGESTIVE HEART FAILURE       -MYOCARDITIS       -CHEST TRAUMA       -ARRYHTHMIAS       -LATE PRESENTING MYOCARDIAL INFARCTION       -COPD   CLINICAL FOLLOW-UP RECOMMENDED.   Lactic acid, plasma     Status: None   Collection Time: 12/19/15  1:01 AM  Result Value Ref Range   Lactic Acid, Venous 0.8 0.5 - 2.0 mmol/L  Vitamin B12     Status: None   Collection Time: 12/19/15  1:01 AM  Result Value Ref Range   Vitamin B-12 184 180 - 914 pg/mL    Comment: (NOTE) This assay is not validated for testing neonatal or myeloproliferative syndrome specimens for Vitamin B12 levels.   TSH     Status: Abnormal   Collection Time: 12/19/15  1:07 AM  Result Value Ref Range   TSH 5.581 (H) 0.350 - 4.500 uIU/mL  MRSA PCR Screening     Status: None   Collection Time: 12/19/15  2:55 AM  Result Value Ref Range   MRSA by PCR NEGATIVE NEGATIVE    Comment:        The GeneXpert MRSA Assay (FDA approved for NASAL specimens only), is one component of a comprehensive MRSA colonization surveillance program. It is not intended to diagnose MRSA infection nor to guide or monitor treatment for MRSA infections.   Troponin I     Status: None   Collection Time: 12/19/15  7:02 AM  Result Value Ref Range   Troponin I 0.03 <0.031 ng/mL    Comment:        NO INDICATION OF MYOCARDIAL INJURY.   Basic metabolic panel     Status: Abnormal   Collection Time: 12/19/15  7:02 AM  Result Value Ref Range   Sodium 137 135 - 145 mmol/L   Potassium 4.0 3.5 - 5.1 mmol/L   Chloride 100 (L) 101 - 111 mmol/L   CO2 26 22 - 32 mmol/L   Glucose, Bld 237 (H)  65 - 99 mg/dL   BUN 12 6 - 20 mg/dL   Creatinine, Ser 0.71 0.44 - 1.00 mg/dL   Calcium 7.6 (L) 8.9 - 10.3 mg/dL   GFR calc non Af Amer >60 >60 mL/min   GFR calc Af Amer >60 >60 mL/min    Comment: (NOTE) The eGFR has been calculated using the CKD EPI equation. This  calculation has not been validated in all clinical situations. eGFR's persistently <60 mL/min signify possible Chronic Kidney Disease.    Anion gap 11 5 - 15  CBC     Status: Abnormal   Collection Time: 12/19/15  7:02 AM  Result Value Ref Range   WBC 8.5 4.0 - 10.5 K/uL   RBC 3.61 (L) 3.87 - 5.11 MIL/uL   Hemoglobin 8.9 (L) 12.0 - 15.0 g/dL   HCT 29.2 (L) 36.0 - 46.0 %   MCV 80.9 78.0 - 100.0 fL   MCH 24.7 (L) 26.0 - 34.0 pg   MCHC 30.5 30.0 - 36.0 g/dL   RDW 18.4 (H) 11.5 - 15.5 %   Platelets 265 150 - 400 K/uL  Glucose, capillary     Status: Abnormal   Collection Time: 12/19/15  8:14 AM  Result Value Ref Range   Glucose-Capillary 179 (H) 65 - 99 mg/dL   Comment 1 Notify RN    Comment 2 Document in Chart   Glucose, capillary     Status: Abnormal   Collection Time: 12/19/15 12:05 PM  Result Value Ref Range   Glucose-Capillary 181 (H) 65 - 99 mg/dL   Comment 1 Notify RN    Comment 2 Document in Chart   Sedimentation rate     Status: Abnormal   Collection Time: 12/19/15  3:51 PM  Result Value Ref Range   Sed Rate 109 (H) 0 - 22 mm/hr  C3 complement     Status: None   Collection Time: 12/19/15  3:51 PM  Result Value Ref Range   C3 Complement 90 82 - 167 mg/dL    Comment: (NOTE) Performed At: [facility] [address] [place], [place] [id number] Lindon Romp MD WU:[id number]   C4 complement     Status: None   Collection Time: 12/19/15  3:51 PM  Result Value Ref Range   Complement C4, Body Fluid 25 14 - 44 mg/dL    Comment: (NOTE) Performed At: [facility] [address] [place], [place] [id number] Lindon Romp MD VH:[id number]   Complement, total     Status: None   Collection Time: 12/19/15  3:51 PM  Result Value Ref Range   Compl, Total (CH50) 60 42 - 60 U/mL    Comment: (NOTE) Performed At: [facility] [place], [place] [id number] Lindon Romp MD [id number]   Glucose, capillary     Status: Abnormal    Collection Time: 12/19/15  4:16 PM  Result Value Ref Range   Glucose-Capillary 222 (H) 65 - 99 mg/dL   Comment 1 Notify RN    Comment 2 Document in Chart   Glucose, capillary     Status: Abnormal   Collection Time: 12/19/15  9:18 PM  Result Value Ref Range   Glucose-Capillary 261 (H) 65 - 99 mg/dL  Glucose, capillary     Status: Abnormal   Collection Time: 12/20/15  8:01 AM  Result Value Ref Range   Glucose-Capillary 142 (H) 65 - 99 mg/dL  T4, free     Status: Abnormal   Collection Time: 12/20/15 11:30  AM  Result Value Ref Range   Free T4 1.34 (H) 0.61 - 1.12 ng/dL  Glucose, capillary     Status: Abnormal   Collection Time: 12/20/15 11:58 AM  Result Value Ref Range   Glucose-Capillary 283 (H) 65 - 99 mg/dL   Dg Chest 2 View  12/19/2015  CLINICAL DATA:  Recent admit for UTI EXAM: CHEST  2 VIEW COMPARISON:  12/18/2015 FINDINGS: Cardiac shadow is mildly enlarged. A pacing device is again seen. Patchy infiltrates are again noted throughout both lungs although some mild improved aeration is seen. The infiltrative changes are noted worst in the right lung base. No acute bony abnormality is noted. IMPRESSION: Slight improvement in bilateral infiltrates Electronically Signed   By: Inez Catalina M.D.   On: 12/19/2015 08:02   Dg Lumbar Spine Complete  12/18/2015  CLINICAL DATA:  Lower extremity weakness for 2 months. EXAM: LUMBAR SPINE - COMPLETE 4+ VIEW COMPARISON:  CT, 12/14/2015 FINDINGS: L5 is a transitional lumbosacral vertebrae. There is no fracture spondylolisthesis. There are no bone lesions. No degenerative changes. Mild straightening of the normal lumbar lordosis unchanged from the CT. Soft tissues are unremarkable. IMPRESSION: 1. No fracture or acute finding. 2. Mild straightening of the normal lumbar lordosis and transitional lumbosacral vertebrae. Otherwise unremarkable. Electronically Signed   By: Lajean Manes M.D.   On: 12/18/2015 23:44       Medical Problem List and Plan: 1.   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  DVT Prophylaxis/Anticoagulation: Pharmaceutical: Lovenox 3. Pain Management:  4. Mood: Team to provide ego support. LCSW to follow for evaluation and support.  5. Neuropsych: This patient is capable of making decisions on her own behalf. 6. Skin/Wound Care: Routine pressure relief measures. Maintain adequate nutrition and hydration.  7. Fluids/Electrolytes/Nutrition:  Monitor strict I/O. Check weights daily.  8. Pyelonephritis: to continue Keflex thorough 03/9 to complete antibiotic course.  9. 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  10 Acute on chronic systolic CHF:  Compensated--check daily weights. Continue lasix 20 mg/day. 11. Lupus: On Cellcept and stress dose steroids at this time.   12. DMT2: Resume lantus at 5 units/HS and titrate to home dose.  Monitor BS with ac/hs checks and use SSI for now. Anticipate BS to start trending up with increase in steroids. Will get dietician to educate patient on CM diet.  13. Bipolar disorder with panic attacks: Was on Abilify years ago and most recently has used  service dog to help manage symptoms.  14. Anemia of chronic disease?: Recheck CBC in am.    Post Admission Physician Evaluation: 1. 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 2. Patient is admitted to receive collaborative, interdisciplinary care between the physiatrist, rehab nursing staff, and therapy team. 3. Patient's level of medical complexity and substantial therapy needs in context of that medical necessity cannot be provided at a lesser intensity of care such as a SNF. 4. Patient has experienced substantial functional loss from his/her baseline which was documented above under the "Functional History" and "Functional Status" headings.  Judging by the patient's  diagnosis, physical exam, and functional history, the patient has potential for functional progress which will result in measurable gains while on inpatient rehab.  These gains will be of substantial and practical use upon discharge  in facilitating mobility and self-care at the household level. 5. Physiatrist will provide 24  hour management of medical needs as well as oversight of the therapy plan/treatment and provide guidance as appropriate regarding the interaction of the two. 6. 24 hour rehab nursing will assist with bladder management, bowel management, safety, skin/wound care, disease management, medication administration, pain management and patient education  and help integrate therapy concepts, techniques,education, etc. 7. PT will assess and treat for/with: pre gait, gait training, endurance , safety, equipment, neuromuscular re education.   Goals are: Sup/minA. 8. OT will assess and treat for/with: ADLs, Cognitive perceptual skills, Neuromuscular re education, safety, endurance, equipment.   Goals are: MinA. Therapy may proceed with showering this patient. 9. SLP will assess and treat for/with: NA.  Goals are: NA. 10. Case Management and Social Worker will assess and treat for psychological issues and discharge planning. 11. Team conference will be held weekly to assess progress toward goals and to determine barriers to discharge. 12. Patient will receive at least 3 hours of therapy per day at least 5 days per week. 13. ELOS: 20-25d      14. Prognosis:  good     Charlett Blake M.D. [facility] FAAPM&R (Sports Med, Neuromuscular Med) Diplomate [place] Board of Electrodiagnostic Med   12/20/2015

## 2015-12-21 NOTE — Progress Notes (Signed)
Occupational Therapy Session Note  Patient Details  Name: Makayla Vasquez MRN: [id number] Date of Birth: 02-12-1981  Today's Date: 12/21/2015 OT Individual Time: 6734-1937 OT Individual Time Calculation (min): 28 min    Skilled Therapeutic Interventions/Progress Updates:   Pt transitioned supine to sit with min assist needed to bring trunk and UB into sitting.  She was able to transfer from bed to wheelchair with mod assist stand pivot.  Rolled pt to the gym for work on UE strengthening and endurance using the UE ergonometer.  She completed 2 sets of 4 mins each with resistance set on level 8.  Oxygen sats decreased to 91% on 4Ls nasal cannula post activity.  They were 96% at rest prior to starting first set.  Second set completed in reverse with pt maintaining RPMs at 27 per minute as with the first set.  Attempted to have pt complete 5 min sets but she was unable to continue after 4 mins.  Returned to room via wheelchair at conclusion of exercises.  Pt left in wheelchair per her request.  Call button and phone in reach.   Therapy Documentation Precautions:  Precautions Precautions: Fall, ICD/Pacemaker Restrictions Weight Bearing Restrictions: No  Pain: Pain Assessment Pain Assessment: No/denies pain ADL: See Function Navigator for Current Functional Status.   Therapy/Group: Individual Therapy  Sharea Guinther OTR/L 12/21/2015, 4:12 PM

## 2015-12-21 NOTE — Care Management Note (Signed)
Inpatient Rehabilitation Center Individual Statement of Services  Patient Name:  Makayla Vasquez  Date:  12/21/2015  Welcome to the Inpatient Rehabilitation Center.  Our goal is to provide you with an individualized program based on your diagnosis and situation, designed to meet your specific needs.  With this comprehensive rehabilitation program, you will be expected to participate in at least 3 hours of rehabilitation therapies Monday-Friday, with modified therapy programming on the weekends.  Your rehabilitation program will include the following services:  Physical Therapy (PT), Occupational Therapy (OT), 24 hour per day rehabilitation nursing, Therapeutic Recreaction (TR), Neuropsychology, Case Management (Social Worker), Rehabilitation Medicine and Nutrition Services  Weekly team conferences will be held on Wednesday to discuss your progress.  Your Social Worker will talk with you frequently to get your input and to update you on team discussions.  Team conferences with you and your family in attendance may also be held.  Expected length of stay: 10-14 days Overall anticipated outcome: Mod/i -supervision with ambulation  Depending on your progress and recovery, your program may change. Your Social Worker will coordinate services and will keep you informed of any changes. Your Social Worker's name and contact numbers are listed  below.  The following services may also be recommended but are not provided by the Inpatient Rehabilitation Center:    Home Health Rehabiltiation Services  Outpatient Rehabilitation Services    Arrangements will be made to provide these services after discharge if needed.  Arrangements include referral to agencies that provide these services.  Your insurance has been verified to be:  Medicare & Medicaid Your primary doctor is:  [facility]  Pertinent information will be shared with your doctor and your insurance  company.  Social Worker:  Dossie Der, SW [phone] or ([phone]  Information discussed with and copy given to patient by: Lucy Chris, 12/21/2015, 1:33 PM

## 2015-12-21 NOTE — Progress Notes (Signed)
Per Dr. Reginia Naas notesprednisone 30 mg bid to be slowly tapered over 4 weeks to home dose of 10 mg/day.

## 2015-12-21 NOTE — Evaluation (Signed)
Physical Therapy Assessment and Plan  Patient Details  Name: Makayla Vasquez MRN: [id number] Date of Birth: Dec 18, 1980  PT Diagnosis: Abnormality of gait, Difficulty walking and Muscle weakness Rehab Potential: Excellent ELOS: 10 to 14 days    Today's Date: 12/21/2015 PT Individual Time: 1002-1105 PT Individual Time Calculation (min): 63 min    Problem List:  Patient Active Problem List   Diagnosis Date Noted  . Pyelonephritis   . Acute on chronic systolic CHF (congestive heart failure) ([place])   . Type 2 diabetes mellitus with complication, with long-term current use of insulin ([place])   . Anemia of chronic disease   . Bipolar affective disorder in remission ([place])   . Debility 12/20/2015  . Acute respiratory failure ([place])   . Generalized weakness   . Lower extremity weakness   . Acute on chronic congestive heart failure ([place])   . AV block   . Acute blood loss anemia   . Supplemental oxygen dependent   . Tachypnea   . 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 Steroid myopathy   . HCAP (healthcare-associated pneumonia) 12/18/2015  . Acute pyelonephritis   . Palpitations   . Complete heart block ([place])   . Cardiac pacemaker in situ   . UTI (urinary tract infection) with pyuria   . Diarrhea 12/14/2015  . Viral gastroenteritis 12/14/2015  . Lower limb pain, anterior   . Calf pain   . Adrenal insufficiency ([place])   . Acute on chronic diastolic CHF (congestive heart failure) ([place])   . Arterial hypotension   . SOB (shortness of breath)   . Lupus (systemic lupus erythematosus) ([place])   . Congenital heart block   . Chronic diastolic congestive heart failure ([place])   . Hypotension 07/08/2015  . Fatigue 06/05/2015  . Nausea with vomiting 06/05/2015  . Hyperlipidemia associated with type 2 diabetes mellitus ([place]) 05/04/2015  . Polyarthralgia 04/26/2015  . Rhinosinusitis 04/19/2015  . Screening for cervical cancer 01/19/2015  . Onychomycosis of right great toe 12/14/2014  . Discoloration  of skin 12/01/2014  . Lip abscess 11/02/2014  . CHF (congestive heart failure) ([place])   . Diabetes mellitus type 2 in obese ([place])   . Hyperglycemia 10/24/2014  . Dehydration   . Sinus tachycardia ([place])   . Chronic respiratory failure with hypoxia ([place]) 08/07/2014  . Atrial tachycardia ([place]) 07/26/2014  . Insertion of implantable subdermal contraceptive 07/12/2014  . Acute respiratory failure with hypoxia ([place]) 06/19/2014  . ILD (interstitial lung disease) ([place]) 06/19/2014  . Pulmonary infiltrates 06/17/2014  . Dyspnea 06/16/2014  . Hypoxemia 06/16/2014  . Congestive dilated cardiomyopathy ([place]) 05/31/2014  . Nonallergic rhinitis 05/22/2014  . Vaginal itching 05/22/2014  . Pneumonia 05/09/2014  . Shortness of breath 05/08/2014  . Sterilization consult 04/20/2014  . Breast tenderness 01/18/2014  . Carpal tunnel syndrome 09/10/2012  . Constipation 03/03/2012  . Chronic systolic heart failure ([place]) 02/26/2012  . Chest pain, unspecified 12/29/2011  . Post-nasal drip 10/24/2011  . Contraception 01/29/2011  . Heart palpitations   . PPM-Medtronic   . Congenital complete AV block   . Bipolar affective disorder ([place])   . Obesity   . GERD (gastroesophageal reflux disease)   . Hypertension 01/05/2011  . THYROGLOSSAL DUCT CYST 07/18/2010  . UNSPECIFIED CARDIAC DYSRHYTHMIA 05/07/2010  . RH FACTOR, NEGATIVE 04/08/2010  . Mild intermittent asthma without complication 68/34/1962  . 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 HEART BLOCK 12/17/2006  . IRRITABLE BOWEL SYNDROME 12/17/2006    Past Medical History:  Past Medical  History  Diagnosis Date  . Cardiac pacemaker     a. Since age 88 in 60. b. Upgrade to BiV in 2013.  . Congenital complete AV block   . Obesity   . GERD (gastroesophageal reflux disease)   . Asymptomatic LV dysfunction     a. Echo in Dec 2011 with EF 35 to 40%. Felt to be due to paced rhythm. b. EF 25-30% in 07/2014.  . Seizures ([place])     as a child- from high fever  .  Anxiety   . Bipolar affective disorder ([place])   . Depression     bipolar  . Carpal tunnel syndrome of right wrist   . Asthma     seasonal allergies   . Arthritis     rheumatoid arthritis- mild, no rheumatology care   . Hypertension   . Pneumonitis     a. a/w hypoxia - inflammatory - large workup 07/2014.  [name] Sinus tachycardia ([place])   . Diabetes mellitus without complication ([place])   . Presence of permanent cardiac pacemaker   . CHF (congestive heart failure) ([place])   . Lupus ([place])   . Lupus (systemic lupus erythematosus) ([place])    Past Surgical History:  Past Surgical History  Procedure Laterality Date  . Throat surgery  1994    s/p laser treatment  . Cesarean section    . Insert / replace / remove pacemaker      2001  . Cholecystectomy    . Iud removal  11/03/2011    Procedure: INTRAUTERINE DEVICE (IUD) REMOVAL;  Surgeon: [name], MD;  Location: [facility];  Service: Gynecology;  Laterality: N/A;  . Cyst excision  12/10/2012    THYROID  . Thyroglossal duct cyst N/A 12/10/2012    Procedure: REVISION OF THYROGLOSSAL DUCT CYST EXCISION;  Surgeon: [name], MD;  Location: [facility];  Service: ENT;  Laterality: N/A;  Revision of Thyroglossal Duct Cyst Excision  . Nasal fracture surgery      /w plate   . Carpal tunnel with cubital tunnel Right 07/26/2013    Procedure: RIGHT LIMITED OPEN CARPAL TUNNEL RELEASE ,  RIGHT CUBITAL TUNNEL RELEASE, INSITU VERSES ULNAR NERVE DECOMPRESSION AND ANTERIOR TRANSPOSITION;  Surgeon: [name], MD;  Location: [facility];  Service: Orthopedics;  Laterality: Right;  . Video bronchoscopy Bilateral 06/19/2014    Procedure: VIDEO BRONCHOSCOPY WITHOUT FLUORO;  Surgeon: Brand Males, MD;  Location: [facility];  Service: Cardiopulmonary;  Laterality: Bilateral;  . Bi-ventricular pacemaker upgrade N/A 03/08/2012    Procedure: BI-VENTRICULAR PACEMAKER UPGRADE;  Surgeon: [name], MD;  Location: [facility] CATH LAB;  Service: Cardiovascular;  Laterality: N/A;  .  Atrial tach ablation N/A 08/14/2014    Procedure: ATRIAL TACH ABLATION;  Surgeon: [name], MD;  Location: [facility] CATH LAB;  Service: Cardiovascular;  Laterality: N/A;  . 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 She was readmitted the next day on 02/28 with leucocytosis, BLE weakness and SOB due to acute on chronic CHF and HCAP. She was placed on IV antibiotics and treated with gentle  diuresis. Cardiac enzymes noted to be mildly elevated due to demand ischemia and Dr. Titus Mould consulted for input on dyspnea and recommended stress dose steroids to help with symptoms. Patient to continue on slow steroid taper and antibiotics narrowed to treat UTI/Pyel ad PCCM doubts HCAP. Respiratory status is improving but she continues to be limited by BLE weakness and fatigue.  Patient transferred to CIR on 12/20/2015 .   Patient currently requires max with mobility secondary to muscle weakness, decreased cardiorespiratoy endurance and decreased oxygen support, unbalanced muscle activation and decreased sitting balance, decreased standing balance, decreased postural control and decreased balance strategies.  Prior to hospitalization, patient was independent  with mobility and lived with Family, Son in a [place] home.  Home access is 2Stairs to enter.  Patient will benefit from skilled PT intervention to maximize safe functional mobility, minimize fall risk and decrease caregiver burden for planned discharge home with 24 hour supervision.  Anticipate patient will  benefit from follow up [place] at discharge.  PT - End of Session Activity Tolerance: Tolerates 30+ min activity with multiple rests Endurance Deficit: Yes PT Assessment Rehab Potential (ACUTE/IP ONLY): Excellent PT Patient demonstrates impairments in the following area(s): Balance;Endurance;Motor;Pain;Safety;Sensory PT Transfers Functional Problem(s): Bed Mobility;Bed to Chair;Car;Furniture;Floor PT Locomotion Functional Problem(s): Ambulation;Wheelchair Mobility;Stairs PT Plan PT Intensity: Minimum of 1-2 x/day ,45 to 90 minutes PT Frequency: 5 out of 7 days PT Duration Estimated Length of Stay: 10 to 14 days  PT Treatment/Interventions: Ambulation/gait training;Balance/vestibular training;Community reintegration;Discharge planning;DME/adaptive equipment instruction;Functional mobility training;Neuromuscular re-education;Pain management;Patient/family education;Stair training;Therapeutic Activities;Therapeutic Exercise;UE/LE Strength taining/ROM;UE/LE Coordination activities;Wheelchair propulsion/positioning PT Transfers Anticipated Outcome(s): Supervision LRAD.  PT Locomotion Anticipated Outcome(s): Supervision with gait for home distances and limited community ambulation. WC for community mobilty.  PT Recommendation Follow Up Recommendations: Home health PT Patient destination: Home Equipment Recommended: Rolling walker with 5" wheels;Wheelchair (measurements);Wheelchair cushion (measurements);To be determined Equipment Details: RW for home ambulation. WC for commnuity mobilty.   Skilled Therapeutic Intervention PT performed evaluation of Patient and initiated treatment plan. Gait training with RW for up to 50 ft with mod A and cues for proper LE placement and advancement. Various levels of assistance needed throughout session for sit to stand and transfers from Max to Mod depending on surface height, PT was required to constantly  Cue patient to push up from sitting surface and proper LE  placement and AD management. Patient was returned to room in [facility] and left in bed with call bell within reach and all other needs met.   PT Evaluation Precautions/Restrictions   fall . Pacemaker.  General   Vital SignsTherapy Vitals Temp: 97.5 F (36.4 C) Temp Source: Oral Pulse Rate: 100 Resp: 18 BP: 99/71 mmHg Patient Position (if appropriate): Sitting Oxygen Therapy SpO2: 100 % O2 Device: Nasal Cannula O2 Flow Rate (L/min): 4 L/min Pain   None at time of eval. Patient reports occasional back pain  Home Living/Prior Functioning Home Living Living Arrangements: Spouse/significant other;Children Vision/Perception    Wears glasses Cognition   Appears intact  Sensation Sensation Light Touch: Impaired by gross assessment (reports intermittent paresthesia, B-U/LE, distally) Stereognosis: Appears Intact Hot/Cold: Appears Intact Proprioception: Appears Intact Coordination Gross Motor Movements are Fluid and Coordinated: Yes (@ BUE) Fine Motor Movements are Fluid and Coordinated: Yes (@ BUE) Finger Nose Finger Test: WFL, eyes closed Motor  Motor Motor: Other (comment) (Generalized weakness) Motor - Skilled Clinical Observations: Quesiton possible ataxia at LE during functional mobility  Mobility Bed Mobility Bed Mobility: Supine to Sit;Sit to Supine Supine to  Sit: 4: Min assist;With rails Supine to Sit Details: Verbal cues for technique;Verbal cues for sequencing;Visual cues/gestures for sequencing Sit to Supine: 4: Min assist Sit to Supine - Details: Tactile cues for posture;Tactile cues for sequencing;Verbal cues for technique;Verbal cues for sequencing Locomotion  Ambulation Ambulation: Yes Ambulation/Gait Assistance: 3: Mod assist Ambulation Distance (Feet): 50 Feet Assistive device: Rolling walker Ambulation/Gait Assistance Details: Tactile cues for sequencing;Tactile cues for posture;Verbal cues for sequencing;Verbal cues for technique;Verbal cues for gait  pattern;Verbal cues for precautions/safety Ambulation/Gait Assistance Details: Heavy use of arms on RW. decreased step length and foot clearance Gait Gait: Yes Gait Pattern: Impaired Gait Pattern: Decreased step length - left;Decreased step length - right;Right foot flat;Left foot flat;Decreased trunk rotation Stairs / Additional Locomotion Stairs: Yes Stairs Assistance: 3: Mod assist Stairs Assistance Details: Tactile cues for sequencing;Tactile cues for posture;Tactile cues for placement;Verbal cues for technique;Verbal cues for sequencing Stair Management Technique: Two rails Number of Stairs: 8 Height of Stairs: 4 Wheelchair Mobility Wheelchair Mobility: Yes Wheelchair Assistance: 5: Investment banker, operational Details: Tactile cues for sequencing;Verbal cues for sequencing;Verbal cues for Marketing executive: Both upper extremities Wheelchair Parts Management: Needs assistance Distance: 154f  Trunk/Postural Assessment  Cervical Assessment Cervical Assessment: Within Functional Limits Thoracic Assessment Thoracic Assessment: Within Functional Limits Lumbar Assessment Lumbar Assessment: Within Functional Limits Postural Control Postural Control: Deficits on evaluation (Decreased righting reactions and activaiton of postural muscles)  Balance Balance Balance Assessed: Yes Static Sitting Balance Static Sitting - Level of Assistance: 5: Stand by assistance Static Sitting - Comment/# of Minutes: 5 Dynamic Sitting Balance Dynamic Sitting - Level of Assistance: 5: Stand by assistance Dynamic Sitting Balance - Compensations:  (Use of hands to correct LOB .) Static Standing Balance Static Standing - Balance Support: Bilateral upper extremity supported Static Standing - Level of Assistance: 4: Min assist Static Standing - Comment/# of Minutes: 2  Extremity Assessment  RUE Assessment RUE Assessment: Exceptions to [facility]elRUE Strength RUE Overall Strength:  Deficits (4/5 grossly) LUE Assessment LUE Assessment: Exceptions to [facility] HospitalL[facility] Strength LUE Overall Strength: Deficits (4/5, grossly) RLE Assessment RLE Assessment: Exceptions to WO[facility] MonroeRLE AROM (degrees) RLE Overall AROM Comments: WFL for tasks assessed.  RLE Strength RLE Overall Strength Comments: 2+/5 for hip flexion. 4-/5 for knee flexion/extion. 4-/5 ankle LLE Assessment LLE Assessment: Exceptions to WFL LLE AROM (degrees) LLE Overall AROM Comments: 2+/5 for hip flexion. 4-/5 knee flexion/extension. 4-/5 ankle PF/DF   See Function Navigator for Current Functional Status.   Refer to Care Plan for Long Term Goals  Recommendations for other services: None  Discharge Criteria: Patient will be discharged from PT if patient refuses treatment 3 consecutive times without medical reason, if treatment goals not met, if there is a change in medical status, if patient makes no progress towards goals or if patient is discharged from hospital.  The above assessment, treatment plan, treatment alternatives and goals were discussed and mutually agreed upon: by patient  ALorie Phenix3/12/2015, 2:50 PM

## 2015-12-21 NOTE — Progress Notes (Signed)
Social Work  Social Work Assessment and Plan  Patient Details  Name: Makayla Vasquez MRN: [id number] Date of Birth: [birth year]  Today's Date: 12/21/2015  Problem List:  Patient Active Problem List   Diagnosis Date Noted  . Pyelonephritis   . Acute on chronic systolic CHF (congestive heart failure) (HCC)   . Type 2 diabetes mellitus with complication, with long-term current use of insulin (HCC)   . Anemia of chronic disease   . Bipolar affective disorder in remission (HCC)   . Debility 12/20/2015  . Acute respiratory failure (HCC)   . Generalized weakness   . Lower extremity weakness   . Acute on chronic congestive heart failure (HCC)   . AV block   . Acute blood loss anemia   . Supplemental oxygen dependent   . Tachypnea   . Critical illness neuropathy (HCC)   . Steroid myopathy   . HCAP (healthcare-associated pneumonia) 12/18/2015  . Acute pyelonephritis   . Palpitations   . Complete heart block (HCC)   . Cardiac pacemaker in situ   . UTI (urinary tract infection) with pyuria   . Diarrhea 12/14/2015  . Viral gastroenteritis 12/14/2015  . Lower limb pain, anterior   . Calf pain   . Adrenal insufficiency (HCC)   . Acute on chronic diastolic CHF (congestive heart failure) (HCC)   . Arterial hypotension   . SOB (shortness of breath)   . Lupus (systemic lupus erythematosus) (HCC)   . Congenital heart block   . Chronic diastolic congestive heart failure (HCC)   . Hypotension 07/08/2015  . Fatigue 06/05/2015  . Nausea with vomiting 06/05/2015  . Hyperlipidemia associated with type 2 diabetes mellitus (HCC) 05/04/2015  . Polyarthralgia 04/26/2015  . Rhinosinusitis 04/19/2015  . Screening for cervical cancer 01/19/2015  . Onychomycosis of right great toe 12/14/2014  . Discoloration of skin 12/01/2014  . Lip abscess 11/02/2014  . CHF (congestive heart failure) (HCC)   . Diabetes mellitus type 2 in obese (HCC)   . Hyperglycemia 10/24/2014  . Dehydration   . Sinus  tachycardia (HCC)   . Chronic respiratory failure with hypoxia (HCC) 08/07/2014  . Atrial tachycardia (HCC) 07/26/2014  . Insertion of implantable subdermal contraceptive 07/12/2014  . Acute respiratory failure with hypoxia (HCC) 06/19/2014  . ILD (interstitial lung disease) (HCC) 06/19/2014  . Pulmonary infiltrates 06/17/2014  . Dyspnea 06/16/2014  . Hypoxemia 06/16/2014  . Congestive dilated cardiomyopathy (HCC) 05/31/2014  . Nonallergic rhinitis 05/22/2014  . Vaginal itching 05/22/2014  . Pneumonia 05/09/2014  . Shortness of breath 05/08/2014  . Sterilization consult 04/20/2014  . Breast tenderness 01/18/2014  . Carpal tunnel syndrome 09/10/2012  . Constipation 03/03/2012  . Chronic systolic heart failure (HCC) 02/26/2012  . Chest pain, unspecified 12/29/2011  . Post-nasal drip 10/24/2011  . Contraception 01/29/2011  . Heart palpitations   . PPM-Medtronic   . Congenital complete AV block   . Bipolar affective disorder (HCC)   . Obesity   . GERD (gastroesophageal reflux disease)   . Hypertension 01/05/2011  . THYROGLOSSAL DUCT CYST 07/18/2010  . UNSPECIFIED CARDIAC DYSRHYTHMIA 05/07/2010  . RH FACTOR, NEGATIVE 04/08/2010  . Mild intermittent asthma without complication 01/11/2009  . 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 HEART BLOCK 12/17/2006  . IRRITABLE BOWEL SYNDROME 12/17/2006   Past Medical History:  Past Medical History  Diagnosis Date  . Cardiac pacemaker     a. Since age 58 in 33. b. Upgrade to BiV in 2013.  . Congenital complete AV block   .  Obesity   . GERD (gastroesophageal reflux disease)   . Asymptomatic LV dysfunction     a. Echo in Dec 2011 with EF 35 to 40%. Felt to be due to paced rhythm. b. EF 25-30% in 07/2014.  . Seizures (HCC)     as a child- from high fever  . Anxiety   . Bipolar affective disorder (HCC)   . Depression     bipolar  . Carpal tunnel syndrome of right wrist   . Asthma     seasonal allergies   . Arthritis     rheumatoid  arthritis- mild, no rheumatology care   . Hypertension   . Pneumonitis     a. a/w hypoxia - inflammatory - large workup 07/2014.  [name] Sinus tachycardia (HCC)   . Diabetes mellitus without complication (HCC)   . Presence of permanent cardiac pacemaker   . CHF (congestive heart failure) (HCC)   . Lupus (HCC)   . Lupus (systemic lupus erythematosus) (HCC)    Past Surgical History:  Past Surgical History  Procedure Laterality Date  . Throat surgery  1994    s/p laser treatment  . Cesarean section    . Insert / replace / remove pacemaker      2001  . Cholecystectomy    . Iud removal  11/03/2011    Procedure: INTRAUTERINE DEVICE (IUD) REMOVAL;  Surgeon: Myra C. Marice Potter, MD;  Location: [facility] ORS;  Service: Gynecology;  Laterality: N/A;  . Cyst excision  12/10/2012    THYROID  . Thyroglossal duct cyst N/A 12/10/2012    Procedure: REVISION OF THYROGLOSSAL DUCT CYST EXCISION;  Surgeon: Serena Colonel, MD;  Location[facility] OR;  Service: ENT;  Laterality: N/A;  Revision of Thyroglossal Duct Cyst Excision  . Nasal fracture surgery      /w plate   . Carpal tunnel with cubital tunnel Right 07/26/2013    Procedure: RIGHT LIMITED OPEN CARPAL TUNNEL RELEASE ,  RIGHT CUBITAL TUNNEL RELEASE, INSITU VERSES ULNAR NERVE DECOMPRESSION AND ANTERIOR TRANSPOSITION;  Surgeon: Dominica Severin, MD;  Location: [facility] OR;  Service: Orthopedics;  Laterality: Right;  . Video bronchoscopy Bilateral 06/19/2014    Procedure: VIDEO BRONCHOSCOPY WITHOUT FLUORO;  Surgeon: Kalman Shan, MD;  Location: [facility] ENDOSCOPY;  Service: Cardiopulmonary;  Laterality: Bilateral;  . Bi-ventricular pacemaker upgrade N/A 03/08/2012    Procedure: BI-VENTRICULAR PACEMAKER UPGRADE;  Surgeon: Marinus Maw, MD;  Location: [facility] CATH LAB;  Service: Cardiovascular;  Laterality: N/A;  . Atrial tach ablation N/A 08/14/2014    Procedure: ATRIAL TACH ABLATION;  Surgeon: Marinus Maw, MD;  Location: [facility] CATH LAB;  Service: Cardiovascular;  Laterality: N/A;  . Right  heart catheterization N/A 10/26/2014    Procedure: RIGHT HEART CATH;  Surgeon: Dolores Patty, MD;  Location: [facility] CATH LAB;  Service: Cardiovascular;  Laterality: N/A;   Social History:  reports that she quit smoking about 19 years ago. Her smoking use included Cigarettes. She has a .125 pack-year smoking history. She has never used smokeless tobacco. She reports that she does not drink alcohol or use illicit drugs.  Family / Support Systems Marital Status: Single Patient Roles: Parent, Partner Spouse/Significant Other: Weston Settle [phone]-cell Children: 5 yo son Other Supports: Clydie Braun Smith-sister [phone]-cell Anticipated Caregiver: Cristal Deer Ability/Limitations of Caregiver: He has been providing assist at home prior to admission Caregiver Availability: 24/7 Family Dynamics: Clsoe knit with Magazine features editor and son, they depende upon one another. Pt's sister is local and checks in on them. She has other siblings up [place].  Pt feels she has good supports.  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 She misses them at home. She feels whatever she can't do her boyfriend will help her with, he has been while she has been ill. She does want to do well here while on rehab. Recent Psychosocial Issues: otehr health issues-lupus and complications from lupus Pyschiatric History: History of bipolar takes medicines and see's  a therapist-she feels they are both helpful and will continue with them. May benefit from neuro-psych while here. Will get input from therapy team Substance Abuse History: No issues  Patient / Family Perceptions, Expectations & Goals Pt/Family understanding of illness & functional limitations: Pt is able to explain her illness and what led up to it. She talks with the MD's rounding and feels her questions have been answered. She hopes to be here a short time and then be able to go home. Cristal Deer has a basic understanding also. Premorbid pt/family roles/activities: Mother, girlfriend, retiree, sister, etc Anticipated changes in roles/activities/participation: resume Pt/family expectations/goals: Pt states: " I hope to go home soon, but want to be walking on my own."  Clydie Braun states: " She is doing better already."  [employer]: Other (Comment) (been to OP recently) Premorbid Home Care/DME Agencies: Other (Comment) (had in past-gets O2 from [facility]) Transportation available at discharge: Family and friends Resource referrals recommended: Neuropsychology, Support group (specify)  Discharge Planning Living Arrangements: Spouse/significant other, Children Support Systems: Spouse/significant other, Children, Other relatives, Friends/neighbors Type of Residence: Private residence Insurance Resources: [employer] (specify county), [employer] Resources: [employer] Screen Referred: No Living Expenses: Psychologist, sport and exercise Management: Patient, Significant Other Does the patient have any problems obtaining your medications?: No Home Management: Mostly Cristal Deer does the home management Patient/Family Preliminary Plans: Return home with Cristal Deer and her son, he can provide assist if needed which he was prior to admission with bathing and drsssing. She has been ill since middle of Feb. He will be here when needed but takes care of their child and transportation is an issue.   Await team's evaluations. Social Work Anticipated Follow Up Needs: HH/OP, Support Group  Clinical Impression Pleasant female who is adjusting to the rehab program, she is tired form theapaies this am. She does want to regain her strength and get mobile again and back to her son and boyfriend. Her sister is supportive also and checks on them daily. Her boyfriend-Christopher has been assisting her with her care prior to admission due to she has been ill for several weeks now. Will await team's evaluations and develop a  Safe discharge plan. Hopefully pt will be a short length of stay due She wants to get back home with her son.   Lucy Chris 12/21/2015, 1:30 PM

## 2015-12-21 NOTE — Interval H&P Note (Signed)
Makayla Vasquez was admitted yesterday to Inpatient Rehabilitation with the diagnosis of steroid myopathy.  The patient's history has been reviewed, patient examined, and there is no change in status.  Patient continues to be appropriate for intensive inpatient rehabilitation.  I have reviewed the patient's chart and labs.  Questions were answered to the patient's satisfaction. The PAPE has been reviewed and assessment remains appropriate.  Ankit Karis Juba 12/21/2015, 10:10 AM

## 2015-12-22 ENCOUNTER — Inpatient Hospital Stay (HOSPITAL_COMMUNITY): Payer: Medicare Other

## 2015-12-22 LAB — HEMOGLOBIN A1C
Hgb A1c MFr Bld: 6.5 % — ABNORMAL HIGH (ref 4.8–5.6)
Mean Plasma Glucose: 140 mg/dL

## 2015-12-22 LAB — GLUCOSE, CAPILLARY: GLUCOSE-CAPILLARY: 98 mg/dL (ref 65–99)

## 2015-12-22 NOTE — Plan of Care (Signed)
Problem: RH BOWEL ELIMINATION Goal: RH STG MANAGE BOWEL WITH ASSISTANCE STG Manage Bowel with Assistance. Outcome: Not Progressing LBM 2/28 Per patient this is normal for her due to IBS;claims she was given laxatives yesterday she will wait for result refuses laxatives today. Claims when she goes she will go

## 2015-12-22 NOTE — Progress Notes (Signed)
[facility] PHYSICAL MEDICINE & REHABILITATION     PROGRESS NOTE  Subjective/Complaints:  Slept ok,bowels are slow but no pain , eating fruits and vegetables  ROS: Denies CP, SOB, n/v/d.  Objective: Vital Signs: Blood pressure 119/60, pulse 60, temperature 98.1 F (36.7 C), temperature source Oral, resp. rate 18, height 5\' 5"  (1.651 m), weight 74.707 kg (164 lb 11.2 oz), SpO2 100 %. No results found. No results for input(s): WBC, HGB, HCT, PLT in the last 72 hours. No results for input(s): NA, K, CL, GLUCOSE, BUN, CREATININE, CALCIUM in the last 72 hours.  Invalid input(s): CO CBG (last 3)   Recent Labs  12/21/15 1143 12/21/15 1650 12/21/15 2051  GLUCAP 150* 224* 263*    Wt Readings from Last 3 Encounters:  12/22/15 74.707 kg (164 lb 11.2 oz)  12/20/15 77.61 kg (171 lb 1.6 oz)  12/17/15 78.5 kg (173 lb 1 oz)    Physical Exam:  BP 119/60 mmHg  Pulse 60  Temp(Src) 98.1 F (36.7 C) (Oral)  Resp 18  Ht 5\' 5"  (1.651 m)  Wt 74.707 kg (164 lb 11.2 oz)  BMI 27.41 kg/m2  SpO2 100% Constitutional: She appears well-developed and well-nourished. NAD. Vital signs reviewed.  HENT: Normocephalic and atraumatic.  Mouth/Throat: Oropharynx is clear and moist. Dental caries present.  Eyes: Conjunctivae and EOM are normal. Neck: Normal range of motion. Neck supple.  Cardiovascular: Normal rate and regular rhythm.  Respiratory: Effort normal. No accessory muscle usage. No respiratory distress. She has decreased breath sounds in the right lower field.  GI: Soft. Bowel sounds are normal. She exhibits no distension. There is no tenderness.  Musculoskeletal: She exhibits no edema or tenderness.  Has bilateral thigh discomfort with ROM bilateral knee due to contractures. Bilateral hands with evidence of muscle wasting.  Neurological: She is alert and oriented.  Speech clear and able to follow basic commands without difficulty.  Motor: Proximally UE/LE 3-/5, distally 4+/5 Skin: Skin is  warm and dry.  Psychiatric: She has a normal mood and affect. Her speech is normal and behavior is normal. Thought content normal. Her mood appears not anxious.    Assessment/Plan: 1. 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 Physiatrist is providing close team supervision and 24 hour management of active medical problems listed below. Physiatrist and rehab team continue to assess barriers to discharge/monitor patient progress toward functional and medical goals.  Function:  Bathing Bathing position   Position: Wheelchair/chair at sink  Bathing parts Body parts bathed by patient: Right arm, Left arm, Chest, Abdomen, Front perineal area, Right upper leg, Left upper leg Body parts bathed by helper: Back  Bathing assist Assist Level: Touching or steadying assistance(Pt > 75%)      Upper Body Dressing/Undressing Upper body dressing   What is the patient wearing?: Hospital gown                Upper body assist Assist Level: Set up   Set up : To obtain clothing/put away  Lower Body Dressing/Undressing Lower body dressing   What is the patient wearing?: Non-skid slipper socks, Hospital Gown         Non-skid slipper socks- Performed by patient: Don/doff right sock, Don/doff left sock                    Lower body assist Assist for lower body dressing: Touching or steadying  assistance (Pt > 75%)      Toileting Toileting   Toileting steps completed by patient: Adjust clothing prior to toileting, Performs perineal hygiene, Adjust clothing after toileting      Toileting assist Assist level: Supervision or verbal cues   Transfers Chair/bed transfer   Chair/bed transfer method: Stand pivot Chair/bed transfer assist level: Maximal assist (Pt 25 - 49%/lift and lower) Chair/bed transfer assistive device: Armrests      Locomotion Ambulation     Max distance: 50     Wheelchair   Type: Manual Max wheelchair distance: 100 Assist Level: Supervision or verbal cues  Cognition Comprehension Comprehension assist level: Follows complex conversation/direction with no assist  Expression Expression assist level: Expresses complex ideas: With no assist  Social Interaction Social Interaction assist level: Interacts appropriately with others - No medications needed.  Problem Solving Problem solving assist level: Solves complex problems: Recognizes & self-corrects  Memory Memory assist level: Complete Independence: No helper    Medical Problem List and Plan: 1. 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 DVT Prophylaxis/Anticoagulation: Pharmaceutical: Lovenox 3. Pain Management: Cont meds PRN 4. Mood: Team to provide ego support. LCSW to follow for evaluation and support.  5. Neuropsych: This patient is capable of making decisions on her own behalf. 6. Skin/Wound Care: Routine pressure relief measures. Maintain adequate nutrition and hydration.  7. Fluids/Electrolytes/Nutrition: Monitor strict I/O. Check weights daily. eating 75-100% 8. Pyelonephritis: to continue Keflex thorough 3/9 to complete antibiotic course.  9. 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  10 Acute on chronic systolic CHF: Compensated--check daily weights. Continue lasix 20 mg/day. 11. Lupus: On Cellcept and stress dose steroids at this time.  12. DMT2: Resumed lantus at 5 units/HS and titrate up to 10 U tonite  Monitor BS with ac/hs checks and use SSI for now.   Anticipate BS to start trending up with increase in steroids.   Dietician to educate patient on CM diet.  13. Bipolar disorder with panic attacks: Was on Abilify years ago and most recently has used service dog to help  manage symptoms.  14. Anemia of chronic disease?:    Hb 8.9 on 3/1  Will order labs for Monday  LOS (Days) 2 A FACE TO FACE EVALUATION WAS PERFORMED  Erick Colace 12/22/2015 9:44 AM

## 2015-12-22 NOTE — Progress Notes (Signed)
Physical Therapy Session Note  Patient Details  Name: Makayla Vasquez MRN: [id number] Date of Birth: Sep 06, 1981  Today's Date: 12/22/2015 PT Individual Time: 1020-1045 PT Individual Time Calculation (min): 25 min   Short Term Goals: Week 1:  PT Short Term Goal 1 (Week 1): Patient will perform bed<>chair stand pivot transfer with MinA and RW.  PT Short Term Goal 2 (Week 1): Patient will ambulate 176ft with RW in controlled environment with MinA. PT Short Term Goal 3 (Week 1): Patient will be able to complete car transfer from [facility] with MinA and RW.  PT Short Term Goal 4 (Week 1): Patient will be able to ascend and descend four 6inch steps with Bilateral hand rails. with min A PT Short Term Goal 5 (Week 1): Patient will be able to perform WC mobility in controlled environment for at least 13ft with supervision assist.   Skilled Therapeutic Interventions/Progress Updates:    Session focused on functional transfers, sit <> stand re-training with focus on hand placement and technique, and gait training to address overall functional mobility, strength, and endurance. Pt performed transfers at min assist level overall. Towards end of session blocked practice sit <> stands x 3 reps to tolerance with min assist and light mod assist for final sit to stand with cues for weighshift and foot placement. Gait training x 30' x 2 reps with overall steady assist and cues for posture.   Therapy Documentation Precautions:  Precautions Precautions: Fall, ICD/Pacemaker Restrictions Weight Bearing Restrictions: No General:   Vital Signs: 4L O2 via [place] - remained 96-100% with activity  Pain:  Denies pain.    See Function Navigator for Current Functional Status.   Therapy/Group: Individual Therapy  Karolee Stamps Darrol Poke, PT, DPT  12/22/2015, 11:17 AM

## 2015-12-23 ENCOUNTER — Inpatient Hospital Stay (HOSPITAL_COMMUNITY): Payer: Medicare Other | Admitting: Occupational Therapy

## 2015-12-23 ENCOUNTER — Inpatient Hospital Stay (HOSPITAL_COMMUNITY): Payer: Medicare Other | Admitting: Physical Therapy

## 2015-12-23 LAB — URINALYSIS, ROUTINE W REFLEX MICROSCOPIC
BILIRUBIN URINE: NEGATIVE
GLUCOSE, UA: 100 mg/dL — AB
HGB URINE DIPSTICK: NEGATIVE
Ketones, ur: NEGATIVE mg/dL
Leukocytes, UA: NEGATIVE
Nitrite: NEGATIVE
PROTEIN: NEGATIVE mg/dL
Specific Gravity, Urine: 1.028 (ref 1.005–1.030)
pH: 7 (ref 5.0–8.0)

## 2015-12-23 LAB — GLUCOSE, CAPILLARY
GLUCOSE-CAPILLARY: 258 mg/dL — AB (ref 65–99)
Glucose-Capillary: 327 mg/dL — ABNORMAL HIGH (ref 65–99)
Glucose-Capillary: 254 mg/dL — ABNORMAL HIGH (ref 65–99)
Glucose-Capillary: 169 mg/dL — ABNORMAL HIGH (ref 65–99)

## 2015-12-23 MED ORDER — INSULIN GLARGINE 100 UNIT/ML ~~LOC~~ SOLN
15.0000 [IU] | Freq: Every day | SUBCUTANEOUS | Status: DC
  Administered 2015-12-23: 15 [IU] via SUBCUTANEOUS
  Filled 2015-12-23 (×2): qty 0.15

## 2015-12-23 NOTE — Progress Notes (Signed)
Occupational Therapy Session Note  Patient Details  Name: Makayla Vasquez MRN: [id number] Date of Birth: [birth year]  Today's Date: 12/23/2015 OT Individual Time: 1121-1221 OT Individual Time Calculation (min): 60 min    Short Term Goals: Week 1:  OT Short Term Goal 1 (Week 1): Pt will complete BUE HEP with supervision for intermittend correction with execution of exercises OT Short Term Goal 2 (Week 1): Pt will complete 3 of 5 grooming tasks while standing supported at sink OT Short Term Goal 3 (Week 1): Pt will complete light housekeeping task using LRAD with min assist to steady OT Short Term Goal 4 (Week 1): Pt will dress with only setup and supevision assist OT Short Term Goal 5 (Week 1): Pt will bathe sitting/standing at shower level with min assist  Skilled Therapeutic Interventions/Progress Updates: Patient participated in skilled OT as follows:  Endurance activities and utilizing energy conservation when needed; wheel chair self propulsion; endurance, balance, safety and cognition via Rolling walker to make oatmeal on stove in ADL apt.;     Tub bench transfer as with Min A - step lacked LE strength to complete step over into tub method     Therapy Documentation Precautions:  Precautions Precautions: Fall, ICD/Pacemaker Restrictions Weight Bearing Restrictions: No  Pain:denied   ADL: ADL ADL Comments: See Functional Assessment Tool See Function Navigator for Current Functional Status.   Therapy/Group: Individual Therapy  Bud Face [facility] 12/23/2015, 4:25 PM

## 2015-12-23 NOTE — Plan of Care (Signed)
Problem: RH BOWEL ELIMINATION Goal: RH STG MANAGE BOWEL WITH ASSISTANCE STG Manage Bowel with Mod Assistance.  Outcome: Not Progressing Claims had a small BM yesterday asking for laxatives today

## 2015-12-23 NOTE — Progress Notes (Signed)
Physical Therapy Session Note  Patient Details  Name: Makayla Vasquez MRN: [id number] Date of Birth: 17-Nov-1980  Today's Date: 12/23/2015 PT Individual Time: 0800-0900 PT Individual Time Calculation (min): 60 min   Short Term Goals: Week 1:  PT Short Term Goal 1 (Week 1): Patient will perform bed<>chair stand pivot transfer with MinA and RW.  PT Short Term Goal 2 (Week 1): Patient will ambulate 177ft with RW in controlled environment with MinA. PT Short Term Goal 3 (Week 1): Patient will be able to complete car transfer from [facility] with MinA and RW.  PT Short Term Goal 4 (Week 1): Patient will be able to ascend and descend four 6inch steps with Bilateral hand rails. with min A PT Short Term Goal 5 (Week 1): Patient will be able to perform WC mobility in controlled environment for at least 137ft with supervision assist.   Skilled Therapeutic Interventions/Progress Updates:  Pt was seen bedside in the am sitting on edge of bed. Pt connected to portable O2 at 4 liters. Pt transferred edge of bed to w/c with min guard and verbal cues. Pt propelled w/c about 200 feet with S and increased time. O2 sat at rest on 4 liters 100% with HR 72. Pt ambulated 50 feet with rolling walker and min guard to min A. O2 sat dropped to 94% and HR 116. Pt ambulated 100 feet with rolling walker and min guard to min A. O2 sat dropped to 89% and HR increased to 126. Pt recovered to O2 sat greater than 90% in less than 30 seconds. Pt performed step taps and alternating step taps 3 sets x 5 reps for LE strengthening. Pt propelled w/c back to room with B UEs and S with increased time. Pt left sitting up in w/c with call bell within reach.   Therapy Documentation Precautions:  Precautions Precautions: Fall, ICD/Pacemaker Restrictions Weight Bearing Restrictions: No General:   Vital Signs: Oxygen Therapy O2 Device: Nasal Cannula O2 Flow Rate (L/min): 4 L/min Pain: Pt c/o headache.   See Function Navigator for Current  Functional Status.   Therapy/Group: Individual Therapy  Rayford Halsted 12/23/2015, 12:04 PM

## 2015-12-23 NOTE — Progress Notes (Signed)
Makayla Vasquez PHYSICAL MEDICINE & REHABILITATION     PROGRESS NOTE  Subjective/Complaints:  Up a lot with urinary freq last noc, no burning Going to therapy with O2 on, wheeling WC ROS: Denies CP, SOB, n/v/d.  Objective: Vital Signs: Blood pressure 113/61, pulse 60, temperature 97.9 F (36.6 C), temperature source Oral, resp. rate 17, height 5\' 5"  (1.651 m), weight 73.891 kg (162 lb 14.4 oz), SpO2 100 %. No results found. No results for input(s): WBC, HGB, HCT, PLT in the last 72 hours. No results for input(s): NA, K, CL, GLUCOSE, BUN, CREATININE, CALCIUM in the last 72 hours.  Invalid input(s): CO CBG (last 3)   Recent Labs  12/21/15 2051 12/22/15 1149 12/23/15 0640  GLUCAP 263* 98 327*    Wt Readings from Last 3 Encounters:  12/23/15 73.891 kg (162 lb 14.4 oz)  12/20/15 77.61 kg (171 lb 1.6 oz)  12/17/15 78.5 kg (173 lb 1 oz)    Physical Exam:  BP 113/61 mmHg  Pulse 60  Temp(Src) 97.9 F (36.6 C) (Oral)  Resp 17  Ht 5\' 5"  (1.651 m)  Wt 73.891 kg (162 lb 14.4 oz)  BMI 27.11 kg/m2  SpO2 100% Constitutional: She appears well-developed and well-nourished. NAD. Vital signs reviewed.  HENT: Normocephalic and atraumatic.  Mouth/Throat: Oropharynx is clear and moist. Dental caries present.  Eyes: Conjunctivae and EOM are normal. Neck: Normal range of motion. Neck supple.  Cardiovascular: Normal rate and regular rhythm.  Respiratory: Effort normal. No accessory muscle usage. No respiratory distress. She has decreased breath sounds in the right lower field.  GI: Soft. Bowel sounds are normal. She exhibits no distension. There is no tenderness.  Musculoskeletal: She exhibits no edema or tenderness.  Has bilateral thigh discomfort with ROM bilateral knee due to contractures. Bilateral hands with evidence of muscle wasting.  Neurological: She is alert and oriented.  Speech clear and able to follow basic commands without difficulty.  Motor: Proximally UE/LE 3-/5, distally  4+/5 Skin: Skin is warm and dry.  Psychiatric: She has a normal mood and affect. Her speech is normal and behavior is normal. Thought content normal. Her mood appears not anxious.    Assessment/Plan: 1. 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 Physiatrist is providing close team supervision and 24 hour management of active medical problems listed below. Physiatrist and rehab team continue to assess barriers to discharge/monitor patient progress toward functional and medical goals.  Function:  Bathing Bathing position   Position: Wheelchair/chair at sink  Bathing parts Body parts bathed by patient: Right arm, Left arm, Chest, Abdomen, Front perineal area, Right upper leg, Left upper leg Body parts bathed by helper: Back  Bathing assist Assist Level: Touching or steadying assistance(Pt > 75%)      Upper Body Dressing/Undressing Upper body dressing   What is the patient wearing?: Hospital gown                Upper body assist Assist Level: Set up   Set up : To obtain clothing/put away  Lower Body Dressing/Undressing Lower body dressing   What is the patient wearing?: Non-skid slipper socks, Hospital Gown         Non-skid slipper socks- Performed by patient: Don/doff right sock, Don/doff left sock                    Lower body assist Assist for lower  body dressing: Touching or steadying assistance (Pt > 75%)      Toileting Toileting   Toileting steps completed by patient: Adjust clothing prior to toileting, Performs perineal hygiene, Adjust clothing after toileting      Toileting assist Assist level: Supervision or verbal cues   Transfers Chair/bed transfer   Chair/bed transfer method: Squat pivot Chair/bed transfer assist level: Touching or steadying assistance (Pt > 75%) Chair/bed transfer assistive device:  Armrests     Locomotion Ambulation     Max distance: 35' Assist level: Touching or steadying assistance (Pt > 75%)   Wheelchair   Type: Manual Max wheelchair distance: 100 Assist Level: Supervision or verbal cues  Cognition Comprehension Comprehension assist level: Follows complex conversation/direction with no assist  Expression Expression assist level: Expresses complex ideas: With no assist  Social Interaction Social Interaction assist level: Interacts appropriately with others - No medications needed.  Problem Solving Problem solving assist level: Solves complex problems: Recognizes & self-corrects  Memory Memory assist level: Complete Independence: No helper    Medical Problem List and Plan: 1. 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 DVT Prophylaxis/Anticoagulation: Pharmaceutical: Lovenox 3. Pain Management: Cont meds PRN 4. Mood: Team to provide ego support. LCSW to follow for evaluation and support.  5. Neuropsych: This patient is capable of making decisions on her own behalf. 6. Skin/Wound Care: Routine pressure relief measures. Maintain adequate nutrition and hydration.  7. Fluids/Electrolytes/Nutrition: Monitor strict I/O. Check weights daily. eating 100% 8. Pyelonephritis: to continue Keflex thorough 3/9 to complete antibiotic course. Has urinary freq recheck UA C and S 9. 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  10 Acute on chronic systolic CHF: Compensated--check daily weights. Continue lasix 20 mg/day. 11. Lupus: On Cellcept and stress dose steroids at this time.  12. DMT2:  lantus  10 U, CBG 327 this am, increase to 15Unit  Monitor BS with ac/hs checks and use SSI for now.   Anticipate BS to start trending up with increase in steroids.   Dietician to educate patient on CM diet.  13. Bipolar disorder with panic attacks: Was on  Abilify years ago and most recently has used service dog to help manage symptoms.  14. Anemia of chronic disease?:    Hb 8.9 on 3/1  Will order labs for Monday 15.  Constipation adjust laxatives  LOS (Days) 3 A FACE TO FACE EVALUATION WAS PERFORMED  Erick Colace 12/23/2015 8:53 AM

## 2015-12-23 NOTE — Progress Notes (Signed)
Physical Therapy Session Note  Patient Details  Name: Makayla Vasquez MRN: [id number] Date of Birth: [birth year]  Today's Date: 12/23/2015 PT Individual Time: 1330-1415 PT Individual Time Calculation (min): 45 min   Short Term Goals: Week 1:  PT Short Term Goal 1 (Week 1): Patient will perform bed<>chair stand pivot transfer with MinA and RW.  PT Short Term Goal 2 (Week 1): Patient will ambulate 151ft with RW in controlled environment with MinA. PT Short Term Goal 3 (Week 1): Patient will be able to complete car transfer from [facility] with MinA and RW.  PT Short Term Goal 4 (Week 1): Patient will be able to ascend and descend four 6inch steps with Bilateral hand rails. with min A PT Short Term Goal 5 (Week 1): Patient will be able to perform WC mobility in controlled environment for at least 123ft with supervision assist.   Skilled Therapeutic Interventions/Progress Updates:  Pt was seen bedside in the pm. Pt connected to portable O2 at 4 liters. Pt propelled w/c about 200 feet with B and B UEs. Pt performed multiple sit to stand transfers with rolling walker and S to min guard. Pt ambulated about 10 feet x 2 with rolling walker and min guard. Pt rode Kinetron at 50 cm/sec for 4, 3 and 3 minutes intervals. Pt propelled w/c back to room with B UEs and S. Pt left sitting up in w/c with call bell within reach.   Therapy Documentation Precautions:  Precautions Precautions: Fall, ICD/Pacemaker Restrictions Weight Bearing Restrictions: No General:   Pain: No c/o pain.   See Function Navigator for Current Functional Status.   Therapy/Group: Individual Therapy  Rayford Halsted 12/23/2015, 3:21 PM

## 2015-12-23 NOTE — Progress Notes (Signed)
Occupational Therapy Session Note  Patient Details  Name: Makayla Vasquez MRN: [id number] Date of Birth: 11-18-1980  Today's Date: 12/23/2015 OT Individual Time: 4401-0272 OT Individual Time Calculation (min): 30 min    Short Term Goals: Week 1:  OT Short Term Goal 1 (Week 1): Pt will complete BUE HEP with supervision for intermittend correction with execution of exercises OT Short Term Goal 2 (Week 1): Pt will complete 3 of 5 grooming tasks while standing supported at sink OT Short Term Goal 3 (Week 1): Pt will complete light housekeeping task using LRAD with min assist to steady OT Short Term Goal 4 (Week 1): Pt will dress with only setup and supevision assist OT Short Term Goal 5 (Week 1): Pt will bathe sitting/standing at shower level with min assist  Skilled Therapeutic Interventions/Progress Updates: patient completed laundry tasks, retriving items from dryer and placing on top, as well as puttig her items into washer and selecting buttons on washing machine.   Though she stated she was tired from prior 60 min PT session, She particpated in endurance activities.  She was left sitting in her w/c next to her bed with her call bell, hospital phone, and cell phone in place.     Therapy Documentation Precautions:  Precautions Precautions: Fall, ICD/Pacemaker Restrictions Weight Bearing Restrictions: No  Pain: 6/10 in bilateral legs but patient stated she wanted to wait til after this last therapy session was completed to take her pain meds    ADL ADL Comments: See Functional Assessment Tool See Function Navigator for Current Functional Status.   Therapy/Group: Individual Therapy  Bud Face [facility] 12/23/2015, 4:45 PM

## 2015-12-24 ENCOUNTER — Ambulatory Visit: Payer: Medicare Other | Admitting: Internal Medicine

## 2015-12-24 ENCOUNTER — Inpatient Hospital Stay (HOSPITAL_COMMUNITY): Payer: Medicare Other | Admitting: Physical Therapy

## 2015-12-24 ENCOUNTER — Ambulatory Visit: Payer: Medicare Other | Admitting: Family Medicine

## 2015-12-24 ENCOUNTER — Inpatient Hospital Stay (HOSPITAL_COMMUNITY): Payer: Medicare Other

## 2015-12-24 DIAGNOSIS — R739 Hyperglycemia, unspecified: Secondary | ICD-10-CM

## 2015-12-24 DIAGNOSIS — K5901 Slow transit constipation: Secondary | ICD-10-CM

## 2015-12-24 LAB — CULTURE, BLOOD (ROUTINE X 2)
CULTURE: NO GROWTH
Culture: NO GROWTH

## 2015-12-24 LAB — CBC WITH DIFFERENTIAL/PLATELET
BASOS ABS: 0 10*3/uL (ref 0.0–0.1)
Basophils Relative: 0 %
EOS PCT: 0 %
Eosinophils Absolute: 0 10*3/uL (ref 0.0–0.7)
HCT: 28.5 % — ABNORMAL LOW (ref 36.0–46.0)
HEMOGLOBIN: 8.3 g/dL — AB (ref 12.0–15.0)
LYMPHS ABS: 1.2 10*3/uL (ref 0.7–4.0)
LYMPHS PCT: 16 %
MCH: 24.3 pg — AB (ref 26.0–34.0)
MCHC: 29.1 g/dL — ABNORMAL LOW (ref 30.0–36.0)
MCV: 83.3 fL (ref 78.0–100.0)
Monocytes Absolute: 0.4 10*3/uL (ref 0.1–1.0)
Monocytes Relative: 5 %
NEUTROS ABS: 6 10*3/uL (ref 1.7–7.7)
NEUTROS PCT: 79 %
PLATELETS: 347 10*3/uL (ref 150–400)
RBC: 3.42 MIL/uL — AB (ref 3.87–5.11)
RDW: 19 % — ABNORMAL HIGH (ref 11.5–15.5)
WBC: 7.6 10*3/uL (ref 4.0–10.5)

## 2015-12-24 LAB — BASIC METABOLIC PANEL
ANION GAP: 7 (ref 5–15)
BUN: 11 mg/dL (ref 6–20)
CHLORIDE: 103 mmol/L (ref 101–111)
CO2: 27 mmol/L (ref 22–32)
Calcium: 8.4 mg/dL — ABNORMAL LOW (ref 8.9–10.3)
Creatinine, Ser: 0.6 mg/dL (ref 0.44–1.00)
GFR calc Af Amer: >60 mL/min (ref >60)
GLUCOSE: 376 mg/dL — AB (ref 65–99)
POTASSIUM: 4.5 mmol/L (ref 3.5–5.1)
Sodium: 137 mmol/L (ref 135–145)

## 2015-12-24 LAB — GLUCOSE, CAPILLARY
GLUCOSE-CAPILLARY: 225 mg/dL — AB (ref 65–99)
GLUCOSE-CAPILLARY: 164 mg/dL — AB (ref 65–99)
GLUCOSE-CAPILLARY: 278 mg/dL — AB (ref 65–99)
GLUCOSE-CAPILLARY: 217 mg/dL — AB (ref 65–99)
GLUCOSE-CAPILLARY: 280 mg/dL — AB (ref 65–99)
Glucose-Capillary: 257 mg/dL — ABNORMAL HIGH (ref 65–99)
Glucose-Capillary: 293 mg/dL — ABNORMAL HIGH (ref 65–99)

## 2015-12-24 LAB — URINE CULTURE: Culture: 1000

## 2015-12-24 MED ORDER — INSULIN ASPART 100 UNIT/ML ~~LOC~~ SOLN
0.0000 [IU] | Freq: Three times a day (TID) | SUBCUTANEOUS | Status: DC
  Administered 2015-12-24: 11 [IU] via SUBCUTANEOUS
  Administered 2015-12-25 (×3): 4 [IU] via SUBCUTANEOUS
  Administered 2015-12-26 (×2): 11 [IU] via SUBCUTANEOUS

## 2015-12-24 MED ORDER — SENNOSIDES-DOCUSATE SODIUM 8.6-50 MG PO TABS
3.0000 | ORAL_TABLET | Freq: Two times a day (BID) | ORAL | Status: DC
  Administered 2015-12-24 – 2015-12-27 (×6): 3 via ORAL
  Filled 2015-12-24 (×7): qty 3

## 2015-12-24 MED ORDER — INSULIN ASPART 100 UNIT/ML ~~LOC~~ SOLN
0.0000 [IU] | Freq: Every day | SUBCUTANEOUS | Status: DC
  Administered 2015-12-24: 3 [IU] via SUBCUTANEOUS
  Administered 2015-12-25: 2 [IU] via SUBCUTANEOUS

## 2015-12-24 MED ORDER — INSULIN GLARGINE 100 UNIT/ML ~~LOC~~ SOLN
20.0000 [IU] | Freq: Every day | SUBCUTANEOUS | Status: DC
  Administered 2015-12-24 – 2015-12-26 (×3): 20 [IU] via SUBCUTANEOUS
  Filled 2015-12-24 (×4): qty 0.2

## 2015-12-24 NOTE — Progress Notes (Signed)
Physical Therapy Session Note  Patient Details  Name: Makayla Vasquez MRN: [id number] Date of Birth: 04/19/1981  Today's Date: 12/24/2015 PT Individual Time: 3710-6269 PT Individual Time Calculation (min): 72 min   Short Term Goals: Week 1:  PT Short Term Goal 1 (Week 1): Patient will perform bed<>chair stand pivot transfer with MinA and RW.  PT Short Term Goal 2 (Week 1): Patient will ambulate 146f with RW in controlled environment with MinA. PT Short Term Goal 3 (Week 1): [facility]  PT Short Term Goal 4 (Week 1): Patient will be able to ascend and descend four 6inch steps with Bilateral hand rails. with min A PT Short Term Goal 5 (Week 1): Patient will be able to perform WC mobility in controlled environment for at least 1522fwith supervision assist.   Skilled Therapeutic Interventions/Progress Updates:    Pt received in bed, on 2L/min supplemental oxygen, and agreeable to PT. Pt transferred OOUriembulated to gym with RW & supervision. During ambulation pt took a standing rest break & SpO2 = 85% but increased to 90% within seconds with cues for pursed lip breathing. Once in gym pt transferred to supine on mat table. Pt instructed on & performed the following BLE: heel slides, AAROM straight leg raises, quad sets, and sidelying AAROM hip abduction all 2 sets of 10 reps with rest breaks in between 2/2 muscle fatigue. Pt's supplemental oxygen increased to 3L/min during therapeutic exercises due to pt's SpO2 being in mid 80's.  Pt's LLE appears weaker than RLE.  Pt then performed 5x sit-to-stand activity with supervision in 33 seconds, from 20 inch surface. Pt reported need to go to restroom therefore pt ambulated 160 ft back to room with RW & performed toilet transfers with supervision. Pt ambulated to sink to perform hand hygiene then to sitting at EOB.  At end of session pt's supplemental oxygen was reduced to 2L/min & pt's SpO2 = 96%;  pt left with all needs met.  Therapy Documentation Precautions:  Precautions Precautions: Fall, ICD/Pacemaker Restrictions Weight Bearing Restrictions: No  Pain: Pain Assessment Pain Assessment: 0-10 Pain Score: 6  Pain Location: Leg Pain Orientation: Left;Right Pain Descriptors / Indicators: Aching;Sore Pain Intervention(s): Repositioned;Ambulation/increased activity (pt reported she would ask for pain meds at a later time)  See Function Navigator for Current Functional Status.   Therapy/Group: Individual Therapy  ViWaunita Schooner/03/2016, 3:55 PM

## 2015-12-24 NOTE — Progress Notes (Signed)
Occupational Therapy Session Note  Patient Details  Name: Makayla Vasquez MRN: [id number] Date of Birth: 10/01/1981  Today's Date: 12/24/2015 OT Individual Time: 1100-1200 OT Individual Time Calculation (min): 60 min   Short Term Goals: Week 1:  OT Short Term Goal 1 (Week 1): Pt will complete BUE HEP with supervision for intermittend correction with execution of exercises OT Short Term Goal 2 (Week 1): Pt will complete 3 of 5 grooming tasks while standing supported at sink OT Short Term Goal 3 (Week 1): Pt will complete light housekeeping task using LRAD with min assist to steady OT Short Term Goal 4 (Week 1): Pt will dress with only setup and supevision assist OT Short Term Goal 5 (Week 1): Pt will bathe sitting/standing at shower level with min assist  Skilled Therapeutic Interventions/Progress Updates: Therapeutic exercise with focus on general strengthening to support improved functional mobility and endurance, improved respirations to reduce dependency on supplemental oxygen, energy conservation, and dynamic standing balance.   Pt received seated in recliner, resting comfortably while maintained on 4L supplemental oxygen.   Pt reported completing BADL prior to session and was without insight relating to her goals or deficits.  With questioning cues, pt identified generalized weakness and SOB as problem areas and agreed to therapeutic exercise to increase arm and leg strength.   Pt re-educated on NuStep and HEP for BUE.  Pt completed NuStep at level 3 resistance for 2:22 min before requesting rest break.  HR/02 @ 128/86 with recovery to 96/93 with re-ed on diaphragmatic and pursed lip breathing w/portable 02 at only 2L flow rate.   Pt resumed session with resistance downgraded to level 1.   Pt completed rmaining 8 min for a total of 470 steps, with 3 min using only legs reduced to 30 steps/min.   Pt was then escorted back to her room in w/c and performed seated UE using written HEP, 10 reps, 1 set,  of 6 exercises with good form and only intermittent correction required.   Pt left in w/c at end of session with all needs placed within reach.     Therapy Documentation Precautions:  Precautions Precautions: Fall, ICD/Pacemaker Restrictions Weight Bearing Restrictions: No  Vital Signs: Oxygen Therapy O2 Device: Nasal Cannula O2 Flow Rate (L/min): 2 L/min (found on)   Pain: Pain Assessment Pain Assessment: No/denies pain Pain Score: 0-No pain   ADL: ADL ADL Comments: See Functional Assessment Tool  See Function Navigator for Current Functional Status.   Therapy/Group: Individual Therapy  Ivanka Kirshner 12/24/2015, 12:34 PM

## 2015-12-24 NOTE — Progress Notes (Signed)
Physical Therapy Session Note  Patient Details  Name: Makayla Vasquez MRN: [id number] Date of Birth: January 19, 1981  Today's Date: 12/24/2015 PT Individual Time: 0800-0914 PT Individual Time Calculation (min): 74 min   Short Term Goals: Week 1:  PT Short Term Goal 1 (Week 1): Patient will perform bed<>chair stand pivot transfer with MinA and RW.  PT Short Term Goal 2 (Week 1): Patient will ambulate 128ft with RW in controlled environment with MinA. PT Short Term Goal 3 (Week 1): Patient will be able to complete car transfer from [facility] with MinA and RW.  PT Short Term Goal 4 (Week 1): Patient will be able to ascend and descend four 6inch steps with Bilateral hand rails. with min A PT Short Term Goal 5 (Week 1): Patient will be able to perform WC mobility in controlled environment for at least 11ft with supervision assist.   Skilled Therapeutic Interventions/Progress Updates:    Patient received sidelying in bed with niece present in room. Patient performed bed mobility with Supervision assist. Transfer to wheelchair with [facility] and RW throughout treatment session. Patient performed WC mobility to therapy gym with B UE use ~243ft with supervision assist. Mini sqaut to pick object off floor 2x 6 Min A from PT and with hands on knees for increased support. Gait 80ft rw and MinA; SpO2. 86%, less than 30 seconds increased to 100%. Stair training 4 steps using step to gait pattern with BUE support, Min A from PT, SpO2: 82%, less than 30 seconds increased to 96%. HR 108. test: 0.48m/s with RW and min A. Bed mobility sit<>supine with supervision A with cues for proper LE sequencing.   Therex.  Standing hip flexion 2x8 BLE with BUE support. Only able to achive ~70 degrees hip flexion Seated knee extension 2x9 BLE Sidelying hip flexion on powder board. x8 BLE to ~100 degrees hip flexion. Sidelying clam shells x 8 BLE.   Hooklying bridges x 8 with constant cues from proper breathing technique    PT  provided MinA for proper positioning and moderate cues from Improved ROM, decreased compensation from trunk, and improved diaphragmatic breathing  Patient returned to room in [facility] and left with call bell within reach.    Therapy Documentation Precautions:  Precautions Precautions: Fall, ICD/Pacemaker Restrictions Weight Bearing Restrictions: No   Pain: Pain Assessment Pain Assessment: No/denies pain Pain Score: 0-No pain   See Function Navigator for Current Functional Status.   Therapy/Group: Individual Therapy  Golden Pop 12/24/2015, 10:47 AM

## 2015-12-24 NOTE — Progress Notes (Signed)
Patient information reviewed and entered into eRehab system by Hideo Googe, RN, CRRN, PPS Coordinator.  Information including medical coding and functional independence measure will be reviewed and updated through discharge.     Per nursing patient was given "Data Collection Information Summary for Patients in Inpatient Rehabilitation Facilities with attached "Privacy Act Statement-Health Care Records" upon admission.  

## 2015-12-24 NOTE — Progress Notes (Signed)
[place] PHYSICAL MEDICINE & REHABILITATION     PROGRESS NOTE  Subjective/Complaints:  Patient seen sitting up in a chair working with PT this morning. She had a good weekend. She believes she is slowly getting stronger.  ROS: Denies CP, SOB, n/v/d.  Objective: Vital Signs: Blood pressure 108/59, pulse 60, temperature 97.5 F (36.4 C), temperature source Oral, resp. rate 17, height 5\' 5"  (1.651 m), weight 74.617 kg (164 lb 8 oz), SpO2 100 %. No results found.  Recent Labs  12/24/15 0432  WBC 7.6  HGB 8.3*  HCT 28.5*  PLT 347    Recent Labs  12/24/15 0432  NA 137  K 4.5  CL 103  GLUCOSE 376*  BUN 11  CREATININE 0.60  CALCIUM 8.4*   CBG (last 3)   Recent Labs  12/23/15 1216 12/23/15 1630 12/23/15 2038  GLUCAP 258* 254* 169*    Wt Readings from Last 3 Encounters:  12/24/15 74.617 kg (164 lb 8 oz)  12/20/15 77.61 kg (171 lb 1.6 oz)  12/17/15 78.5 kg (173 lb 1 oz)    Physical Exam:  BP 108/59 mmHg  Pulse 60  Temp(Src) 97.5 F (36.4 C) (Oral)  Resp 17  Ht 5\' 5"  (1.651 m)  Wt 74.617 kg (164 lb 8 oz)  BMI 27.37 kg/m2  SpO2 100% Constitutional: She appears well-developed and well-nourished. NAD. Vital signs reviewed.  HENT: Normocephalic and atraumatic.  Mouth/Throat: Oropharynx is clear and moist. Dental caries present.  Eyes: Conjunctivae and EOM are normal. Neck: Normal range of motion. Neck supple.  Cardiovascular: Normal rate and regular rhythm.  Respiratory: Effort normal. No accessory muscle usage. No respiratory distress. She has decreased breath sounds  GI: Soft. Bowel sounds are normal. She exhibits no distension. There is no tenderness.  Musculoskeletal: She exhibits no edema or tenderness.  Bilateral hands with evidence of muscle wasting.  Neurological: She is alert and oriented.  Speech clear and able to follow basic commands without difficulty.  Motor: B/L UE: Shoulder abduction 4+/5, elbow flexion/extension 4/5, hand grip 4+/5 B/l  LE: Hip flexion 3/5, distally 4/5 Skin: Skin is warm and dry.  Psychiatric: She has a normal mood and affect. Her speech is normal and behavior is normal. Thought content normal. Her mood appears not anxious.    Assessment/Plan: 1. 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 Physiatrist is providing close team supervision and 24 hour management of active medical problems listed below. Physiatrist and rehab team continue to assess barriers to discharge/monitor patient progress toward functional and medical goals.  Function:  Bathing Bathing position   Position: Wheelchair/chair at sink  Bathing parts Body parts bathed by patient: Right arm, Left arm, Chest, Abdomen, Front perineal area, Right upper leg, Left upper leg Body parts bathed by helper: Back  Bathing assist Assist Level: Touching or steadying assistance(Pt > 75%)      Upper Body Dressing/Undressing Upper body dressing   What is the patient wearing?: Hospital gown                Upper body assist Assist Level: Set up   Set up : To obtain clothing/put away  Lower Body Dressing/Undressing Lower body dressing   What is the patient wearing?: Non-skid slipper socks, Hospital Gown         Non-skid slipper socks- Performed by patient: Don/doff right sock, Don/doff left sock  Lower body assist Assist for lower body dressing: Touching or steadying assistance (Pt > 75%)      Toileting Toileting   Toileting steps completed by patient: Adjust clothing prior to toileting, Performs perineal hygiene, Adjust clothing after toileting      Toileting assist Assist level: Supervision or verbal cues   Transfers Chair/bed transfer   Chair/bed transfer method: Stand pivot Chair/bed transfer assist level: Touching or steadying assistance (Pt > 75%) Chair/bed  transfer assistive device: Armrests     Locomotion Ambulation     Max distance: 100 Assist level: Touching or steadying assistance (Pt > 75%)   Wheelchair   Type: Manual Max wheelchair distance: 200 Assist Level: Supervision or verbal cues  Cognition Comprehension Comprehension assist level: Follows complex conversation/direction with no assist  Expression Expression assist level: Expresses complex ideas: With no assist  Social Interaction Social Interaction assist level: Interacts appropriately with others - No medications needed.  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 DVT Prophylaxis/Anticoagulation: Pharmaceutical: Lovenox 3. Pain Management: Cont meds PRN 4. Mood: Team to provide ego support. LCSW to follow for evaluation and support.  5. Neuropsych: This patient is capable of making decisions on her own behalf. 6. Skin/Wound Care: Routine pressure relief measures. Maintain adequate nutrition and hydration.  7. Fluids/Electrolytes/Nutrition: Monitor strict I/O. Check weights daily. 8. Pyelonephritis: to continue Keflex thorough 3/9 to complete antibiotic course.   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  10 Acute on chronic systolic CHF: Compensated--check daily weights. Continue lasix 20 mg/day. 11. Lupus: On Cellcept and stress dose steroids at this time.  12. DMT2:  lantus 15U  Monitor BS with ac/hs checks and use SSI for now.   Anticipate BS to start trending up with increase in steroids.   Dietician to educate patient on CM diet.   CBG 376 this am 13. Bipolar disorder with panic attacks: Was on Abilify years ago and most  recently has used service dog to help manage symptoms.  14. Anemia of chronic disease?:    Hb 8.3 on 3/6 15.  Constipation adjust laxatives  Increased meds on 3/9  LOS (Days) 4 A FACE TO FACE EVALUATION WAS PERFORMED  Erling Arrazola Karis Juba 12/24/2015 9:56 AM

## 2015-12-24 NOTE — Progress Notes (Signed)
Recreational Therapy Assessment and Plan  Patient Details  Name: Makayla Vasquez MRN: [id number] Date of Birth: 1981/01/24 Today's Date: 12/24/2015  Rehab Potential: Good ELOS: 10 days   Assessment Clinical Impression:  Problem List:  Patient Active Problem List   Diagnosis Date Noted  . Pyelonephritis   . Acute on chronic systolic CHF (congestive heart failure) ([place])   . Type 2 diabetes mellitus with complication, with long-term current use of insulin ([place])   . Anemia of chronic disease   . Bipolar affective disorder in remission ([place])   . Debility 12/20/2015  . Acute respiratory failure ([place])   . Generalized weakness   . Lower extremity weakness   . Acute on chronic congestive heart failure ([place])   . AV block   . Acute blood loss anemia   . Supplemental oxygen dependent   . Tachypnea   . Critical illness neuropathy ([place])   . Steroid myopathy   . HCAP (healthcare-associated pneumonia) 12/18/2015  . Acute pyelonephritis   . Palpitations   . Complete heart block ([place])   . Cardiac pacemaker in situ   . UTI (urinary tract infection) with pyuria   . Diarrhea 12/14/2015  . Viral gastroenteritis 12/14/2015  . Lower limb pain, anterior   . Calf pain   . Adrenal insufficiency ([place])   . Acute on chronic diastolic CHF (congestive heart failure) ([place])   . Arterial hypotension   . SOB (shortness of breath)   . Lupus (systemic lupus erythematosus) ([place])   . Congenital heart block   . Chronic diastolic congestive heart failure ([place])   . Hypotension 07/08/2015  . Fatigue 06/05/2015  . Nausea with vomiting 06/05/2015  . Hyperlipidemia associated with type 2 diabetes mellitus ([place]) 05/04/2015  . Polyarthralgia 04/26/2015  . Rhinosinusitis 04/19/2015  . Screening for cervical cancer 01/19/2015  . Onychomycosis of right great toe 12/14/2014  . Discoloration  of skin 12/01/2014  . Lip abscess 11/02/2014  . CHF (congestive heart failure) ([place])   . Diabetes mellitus type 2 in obese ([place])   . Hyperglycemia 10/24/2014  . Dehydration   . Sinus tachycardia ([place])   . Chronic respiratory failure with hypoxia ([place]) 08/07/2014  . Atrial tachycardia ([place]) 07/26/2014  . Insertion of implantable subdermal contraceptive 07/12/2014  . Acute respiratory failure with hypoxia ([place]) 06/19/2014  . ILD (interstitial lung disease) ([place]) 06/19/2014  . Pulmonary infiltrates 06/17/2014  . Dyspnea 06/16/2014  . Hypoxemia 06/16/2014  . Congestive dilated cardiomyopathy ([place]) 05/31/2014  . Nonallergic rhinitis 05/22/2014  . Vaginal itching 05/22/2014  . Pneumonia 05/09/2014  . Shortness of breath 05/08/2014  . Sterilization consult 04/20/2014  . Breast tenderness 01/18/2014  . Carpal tunnel syndrome 09/10/2012  . Constipation 03/03/2012  . Chronic systolic heart failure ([place]) 02/26/2012  . Chest pain, unspecified 12/29/2011  . Post-nasal drip 10/24/2011  . Contraception 01/29/2011  . Heart palpitations   . PPM-Medtronic   . Congenital complete AV block   . Bipolar affective disorder ([place])   . Obesity   . GERD (gastroesophageal reflux disease)   . Hypertension 01/05/2011  . THYROGLOSSAL DUCT CYST 07/18/2010  . UNSPECIFIED CARDIAC DYSRHYTHMIA 05/07/2010  . RH FACTOR, NEGATIVE 04/08/2010  . Mild intermittent asthma without complication 81/07/3158  . 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 HEART BLOCK 12/17/2006  . IRRITABLE BOWEL SYNDROME 12/17/2006    Past Medical History:  Past Medical History  Diagnosis Date  . Cardiac pacemaker     a. Since age 35 in 75. b. Upgrade to BiV in  2013.  . Congenital complete AV block   . Obesity   . GERD (gastroesophageal reflux disease)   . Asymptomatic LV dysfunction     a.  Echo in Dec 2011 with EF 35 to 40%. Felt to be due to paced rhythm. b. EF 25-30% in 07/2014.  . Seizures ([place])     as a child- from high fever  . Anxiety   . Bipolar affective disorder ([place])   . Depression     bipolar  . Carpal tunnel syndrome of right wrist   . Asthma     seasonal allergies   . Arthritis     rheumatoid arthritis- mild, no rheumatology care   . Hypertension   . Pneumonitis     a. a/w hypoxia - inflammatory - large workup 07/2014.  [name] Sinus tachycardia ([place])   . Diabetes mellitus without complication ([place])   . Presence of permanent cardiac pacemaker   . CHF (congestive heart failure) ([place])   . Lupus ([place])   . Lupus (systemic lupus erythematosus) ([place])    Past Surgical History:  Past Surgical History  Procedure Laterality Date  . Throat surgery  1994    s/p laser treatment  . Cesarean section    . Insert / replace / remove pacemaker      2001  . Cholecystectomy    . Iud removal  11/03/2011    Procedure: INTRAUTERINE DEVICE (IUD) REMOVAL; Surgeon: [name], MD; Location: [facility]; Service: Gynecology; Laterality: N/A;  . Cyst excision  12/10/2012    THYROID  . Thyroglossal duct cyst N/A 12/10/2012    Procedure: REVISION OF THYROGLOSSAL DUCT CYST EXCISION; Surgeon: [name], MD; Location: [facility]; Service: ENT; Laterality: N/A; Revision of Thyroglossal Duct Cyst Excision  . Nasal fracture surgery      /w plate   . Carpal tunnel with cubital tunnel Right 07/26/2013    Procedure: RIGHT LIMITED OPEN CARPAL TUNNEL RELEASE , RIGHT CUBITAL TUNNEL RELEASE, INSITU VERSES ULNAR NERVE DECOMPRESSION AND ANTERIOR TRANSPOSITION; Surgeon: [name], MD; Location: [facility]; Service: Orthopedics; Laterality: Right;  . Video bronchoscopy Bilateral 06/19/2014    Procedure: VIDEO BRONCHOSCOPY WITHOUT FLUORO; Surgeon: Brand Males, MD;  Location: [facility]; Service: Cardiopulmonary; Laterality: Bilateral;  . Bi-ventricular pacemaker upgrade N/A 03/08/2012    Procedure: BI-VENTRICULAR PACEMAKER UPGRADE; Surgeon: [name], MD; Location: [facility] CATH LAB; Service: Cardiovascular; Laterality: N/A;  . Atrial tach ablation N/A 08/14/2014    Procedure: ATRIAL TACH ABLATION; Surgeon: [name], MD; Location[facility] C[facility]; Service: Cardiovascular; Laterality: N/A;  . Right heart catheterization N/A 10/26/2014    Procedure: RIGHT HEART CATH; Surgeon: [name], MD; Location: [facility] CATH LAB; Service: Cardiovascular; Laterality: N/A;    Assessment & Plan Clinical Impression:Patient is a 35 y.o. 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 She was readmitted the next day on 02/28 with leucocytosis, BLE weakness and SOB due to acute on chronic CHF and HCAP. She was placed on IV antibiotics and treated with gentle diuresis. Cardiac enzymes noted to be mildly elevated due to demand ischemia and Dr. Titus Mould consulted for input on dyspnea and recommended stress dose steroids to help with symptoms. Patient to continue on slow steroid taper and antibiotics narrowed to treat UTI/Pyel ad PCCM doubts HCAP. Respiratory  status is improving but she continues to be limited by BLE weakness and fatigue. Patient transferred to CIR on 12/20/2015.      Pt presents with decreased activity tolerance, decreased functional mobility, decreased balance Limiting pt's independence with leisure/community pursuits.    Leisure History/Participation Premorbid leisure interest/current participation: Community - Doctor, hospital - Grocery store;Crafts -  Knitting/Crocheting;Crafts - Other (Comment);Crafts - Woodworking (interested in refurbishing furniture/crafts; anything with her 87 yo son) Other Leisure Interests: Cooking/Baking (cooking big meals on Sundays) Identified Leisure Barriers: transportation Leisure Participation Style: Alone;With Family/Friends Awareness of Community Resources: Good-identify 3 post discharge leisure resources Psychosocial / Spiritual Patient agreeable to Pet Therapy: Yes Does patient have pets?: No (used to have a dog) Social interaction - Mood/Behavior: Cooperative Engineer, drilling for Education?: Yes Recreational Therapy Orientation Orientation -Reviewed with patient: Available activity resources Strengths/Weaknesses Patient Strengths/Abilities: Willingness to participate Patient weaknesses: Physical limitations;Minimal Premorbid Leisure Activity TR Patient demonstrates impairments in the following area(s): Endurance;Motor;Safety  Plan Rec Therapy Plan Is patient appropriate for Therapeutic Recreation?: Yes Rehab Potential: Good Treatment times per week: Min 1 time per week >20 minutes Estimated Length of Stay: 10 days TR Treatment/Interventions: Adaptive equipment instruction;1:1 session;Balance/vestibular training;Functional mobility training;Community reintegration;Patient/family education;Leisure education;Recreation/leisure participation;Therapeutic activities;Therapeutic exercise;UE/LE Coordination activities  Recommendations for other services: None  Discharge Criteria: Patient will be discharged from TR if patient refuses treatment 3 consecutive times without medical reason.  If treatment goals not met, if there is a change in medical status, if patient makes no progress towards goals or if patient is discharged from hospital.  The above assessment, treatment plan, treatment alternatives and goals were discussed and mutually agreed upon: by patient  Malcom 12/24/2015,  10:41 AM

## 2015-12-25 ENCOUNTER — Inpatient Hospital Stay (HOSPITAL_COMMUNITY): Payer: Medicare Other | Admitting: Physical Therapy

## 2015-12-25 ENCOUNTER — Inpatient Hospital Stay (HOSPITAL_COMMUNITY): Payer: Medicare Other | Admitting: *Deleted

## 2015-12-25 ENCOUNTER — Inpatient Hospital Stay (HOSPITAL_COMMUNITY): Payer: Medicare Other

## 2015-12-25 DIAGNOSIS — R739 Hyperglycemia, unspecified: Secondary | ICD-10-CM | POA: Insufficient documentation

## 2015-12-25 DIAGNOSIS — T380X5A Adverse effect of glucocorticoids and synthetic analogues, initial encounter: Secondary | ICD-10-CM

## 2015-12-25 LAB — GLUCOSE, CAPILLARY
Glucose-Capillary: 157 mg/dL — ABNORMAL HIGH (ref 65–99)
Glucose-Capillary: 179 mg/dL — ABNORMAL HIGH (ref 65–99)

## 2015-12-25 NOTE — Progress Notes (Signed)
Social Work Patient ID: Makayla Vasquez, female   DOB: 07/24/1981, 35 y.o.   MRN: [id number] Met with pt to inform team feels she will be ready for discharge on Thursday, as long as no medical issues. Pt is pleased with this plan and is wanting to go home to see her son. PA aware and have texted MD. Team conference tomorrow and will work on discharge needs.

## 2015-12-25 NOTE — Progress Notes (Signed)
Physical Therapy Session Note  Patient Details  Name: Makayla Vasquez MRN: [id number] Date of Birth: Nov 17, 1980  Today's Date: 12/25/2015 PT Individual Time: 1300-1400 PT Individual Time Calculation (min): 60 min   Short Term Goals: Week 1:  PT Short Term Goal 1 (Week 1): Patient will perform bed<>chair stand pivot transfer with MinA and RW.  PT Short Term Goal 2 (Week 1): Patient will ambulate 137ft with RW in controlled environment with MinA. PT Short Term Goal 3 (Week 1): Patient will be able to complete car transfer from [facility] with MinA and RW.  PT Short Term Goal 4 (Week 1): Patient will be able to ascend and descend four 6inch steps with Bilateral hand rails. with min A PT Short Term Goal 5 (Week 1): Patient will be able to perform WC mobility in controlled environment for at least 173ft with supervision assist.  Week 2:     Skilled Therapeutic Interventions/Progress Updates:    Patient received on toilet with NT present in room. PT took over care from NT. Min A with sit>stand from toilet with cues for proper hand placement to increase ease of transfer. Patient performed gait within room using RW and supervision assist at beginning and end of PT session.  PT assisted patient change wet brief and wet hospital gown with supervision assist while standing. Gait to therapy gym ~251ft with RW and close supervision and O2 on 3L.  Qped on mat table x 2 minutes with supervision assist to achieve position. Qped to tall kneeling x 3 with supervision and cue for increase Gluteal and Ham String activation. Pt instructed in floor transfer with demonstration from PT. PT required to provide Mod A with LE positioning to attain half kneeling and Mod A for half kneel to stand with cues to increase push through UE on Mat table.  Gait Training back to room~29ft with RW and Supervision assist from PT, min cues for improved step length and decreased UE support.  PT discussed discharge plans with Patient and  possible needs for WC and RW upon D/C from Inpatient rehab hospital.   Patient remained on 3L/min O2 with gait and all other therapeutic activity; SpO2 remained >94%.    Patient left sitting in recliner with call bell within reach.   Therapy Documentation Precautions:  Precautions Precautions: Fall, ICD/Pacemaker Restrictions Weight Bearing Restrictions: No Pain: Pain Assessment Pain Assessment: No/denies pain  See Function Navigator for Current Functional Status.   Therapy/Group: Individual Therapy  Golden Pop 12/25/2015, 4:31 PM

## 2015-12-25 NOTE — Progress Notes (Signed)
Recreational Therapy Session Note  Patient Details  Name: Makayla Vasquez MRN: [id number] Date of Birth: [birth year] Today's Date: 12/25/2015  Pain: no c/o  Skilled Therapeutic Interventions/Progress Updates:  Continued discussion with pt about use of leisure time post discharge.  Pt able to identify 2-3 potential activities for participation post discharge along with adaptations and energy conservation techniques to assist with overall safety.  Pt just learned about Thursday discharge and is excited about returning home with her son.  Therapy/Group: Individual Therapy  Kamdyn Covel 12/25/2015, 3:56 PM

## 2015-12-25 NOTE — Progress Notes (Signed)
Physical Therapy Note  Patient Details  Name: Makayla Vasquez MRN: [id number] Date of Birth: [birth year] Today's Date: 12/25/2015   Attempted to see patient at 1047 for make up session due to missing time today, but pt declined. She states she is still feeling nauseous and upset stomach. Will follow up if schedule allows.    Karolee Stamps Darrol Poke, PT, DPT  12/25/2015, 11:17 AM

## 2015-12-25 NOTE — Progress Notes (Signed)
Physical Therapy Session Note  Patient Details  Name: Makayla Vasquez MRN: [id number] Date of Birth: [birth year]  Today's Date: 12/25/2015 PT Individual Time: 1507-1601 PT Individual Time Calculation (min): 54 min   Short Term Goals: Week 1:  PT Short Term Goal 1 (Week 1): Patient will perform bed<>chair stand pivot transfer with MinA and RW.  PT Short Term Goal 2 (Week 1): Patient will ambulate 182ft with RW in controlled environment with MinA. PT Short Term Goal 3 (Week 1): Patient will be able to complete car transfer from [facility] with MinA and RW.  PT Short Term Goal 4 (Week 1): Patient will be able to ascend and descend four 6inch steps with Bilateral hand rails. with min A PT Short Term Goal 5 (Week 1): Patient will be able to perform WC mobility in controlled environment for at least 173ft with supervision assist.   Skilled Therapeutic Interventions/Progress Updates:   Pt received seated in recliner with OT present discussing equipment needs.  OT reports he would like pt to practice transferring in/out of tub with tub bench.  Also discussed with pt other equipment needs: pt reports she has a RW at home but they are ordering a manual w/c for her for longer distances.  Pt performed sit > stand from recliner with min lifting assistance due to low seat.  Pt performed ambulation with RW x 200' +100' + 100' with supervision in controlled and home environment with therapist assisting with 02 management.  Verbalized to pt safe sequence for transferring on/off of tub bench; pt return demonstrated with supervision.  Continued D/C planning and transfer training with pt performing simulated low car transfer (to simulate sister's car-who will pick her up at D/C) with pt performing full transfer with RW and supervision.  Pt reporting her bed at home is taller; set up mat to simulate bed at home.  Pt able to perform transfer on/off bed and supine <> sit on taller flat mat, no rails mod I with extra time to sequence.   In gym performed bilat UE and LE strengthening and endurance training on Nustep at level 4 resistance x 8 minutes.  Pt able to maintain Sp02 98% and HR 90 bpm during ambulation and on Nustep.  Pt reports going outside to play with her son.  Performed gait training over compliant surface with RW x 10' x 2 reps with close supervision-min A for balance and verbal cues for safety and sequencing.  Returned to room in w/c due to fatigue and ambulated from w/c > bed x 15' without AD and with min A for balance.  Pt left seated EOB with all items within reach.  Therapy Documentation Precautions:  Precautions Precautions: Fall, ICD/Pacemaker Restrictions Weight Bearing Restrictions: No Vital Signs: Therapy Vitals Pulse Rate: 90 Oxygen Therapy SpO2: 98 % O2 Device: Nasal Cannula O2 Flow Rate (L/min): 4 L/min Pulse Oximetry Type: Intermittent Pain: Pain Assessment Pain Assessment: No/denies pain   See Function Navigator for Current Functional Status.   Therapy/Group: Individual Therapy  Edman Circle [facility] 12/25/2015, 4:16 PM

## 2015-12-25 NOTE — Progress Notes (Signed)
[place] PHYSICAL MEDICINE & REHABILITATION     PROGRESS NOTE  Subjective/Complaints:  Patient seen this morning lying in bed. She notes he still has not had a BM. Encourage patient to take suppository, which she verbalizes willingness to do.  ROS: + Constipation. Denies CP, SOB, n/v/d.  Objective: Vital Signs: Blood pressure 114/58, pulse 63, temperature 98.4 F (36.9 C), temperature source Oral, resp. rate 18, height 5\' 5"  (1.651 m), weight 73 kg (160 lb 15 oz), SpO2 98 %. No results found.  Recent Labs  12/24/15 0432  WBC 7.6  HGB 8.3*  HCT 28.5*  PLT 347    Recent Labs  12/24/15 0432  NA 137  K 4.5  CL 103  GLUCOSE 376*  BUN 11  CREATININE 0.60  CALCIUM 8.4*   CBG (last 3)   Recent Labs  12/24/15 1642 12/24/15 2137 12/25/15 0651  GLUCAP 293* 280* 157*    Wt Readings from Last 3 Encounters:  12/25/15 73 kg (160 lb 15 oz)  12/20/15 77.61 kg (171 lb 1.6 oz)  12/17/15 78.5 kg (173 lb 1 oz)    Physical Exam:  BP 114/58 mmHg  Pulse 63  Temp(Src) 98.4 F (36.9 C) (Oral)  Resp 18  Ht 5\' 5"  (1.651 m)  Wt 73 kg (160 lb 15 oz)  BMI 26.78 kg/m2  SpO2 98% Constitutional: She appears well-developed and well-nourished. NAD. Vital signs reviewed.  HENT: Normocephalic and atraumatic.  Mouth/Throat: Oropharynx is clear and moist. Dental caries present.  Eyes: Conjunctivae and EOM are normal. Neck: Normal range of motion. Neck supple.  Cardiovascular: Normal rate and regular rhythm.  Respiratory: Effort normal. No accessory muscle usage. No respiratory distress. She has decreased breath sounds  GI: Soft. Bowel sounds are normal. She exhibits no distension. There is no tenderness.  Musculoskeletal: She exhibits no edema or tenderness.  Bilateral hands with evidence of muscle wasting.  Neurological: She is alert and oriented.  Speech clear and able to follow basic commands without difficulty.  Motor: B/L UE: Shoulder abduction 4+/5, elbow flexion/extension  4/5, hand grip 4+/5 B/l LE: Hip flexion 3/5, distally 4+/5 Skin: Skin is warm and dry.  Psychiatric: She has a normal mood and affect. Her speech is normal and behavior is normal. Thought content normal. Her mood appears not anxious.    Assessment/Plan: 1. 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 Physiatrist is providing close team supervision and 24 hour management of active medical problems listed below. Physiatrist and rehab team continue to assess barriers to discharge/monitor patient progress toward functional and medical goals.  Function:  Bathing Bathing position   Position: Wheelchair/chair at sink  Bathing parts Body parts bathed by patient: Right arm, Left arm, Chest, Abdomen, Front perineal area, Right upper leg, Left upper leg Body parts bathed by helper: Back  Bathing assist Assist Level: Touching or steadying assistance(Pt > 75%)      Upper Body Dressing/Undressing Upper body dressing   What is the patient wearing?: Hospital gown                Upper body assist Assist Level: Set up   Set up : To obtain clothing/put away  Lower Body Dressing/Undressing Lower body dressing   What is the patient wearing?: Non-skid slipper socks, Hospital Gown         Non-skid slipper socks- Performed by patient: Don/doff right sock, Don/doff left sock  Lower body assist Assist for lower body dressing: Touching or steadying assistance (Pt > 75%)      Toileting Toileting   Toileting steps completed by patient: Adjust clothing prior to toileting, Performs perineal hygiene, Adjust clothing after toileting      Toileting assist Assist level: Supervision or verbal cues   Transfers Chair/bed transfer   Chair/bed transfer method: Ambulatory Chair/bed transfer assist level: Supervision or verbal  cues Chair/bed transfer assistive device: Patent attorney     Max distance: 160 Assist level: Supervision or verbal cues   Wheelchair   Type: Manual Max wheelchair distance: 290ft Assist Level: Supervision or verbal cues  Cognition Comprehension Comprehension assist level: Follows complex conversation/direction with no assist  Expression Expression assist level: Expresses complex ideas: With no assist  Social Interaction Social Interaction assist level: Interacts appropriately with others - No medications needed.  Problem Solving Problem solving assist level: Solves complex problems: Recognizes & self-corrects  Memory Memory assist level: Recognizes or recalls 90% of the time/requires cueing < 10% of the time    Medical Problem List and Plan: 1. 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 DVT Prophylaxis/Anticoagulation: Pharmaceutical: Lovenox 3. Pain Management: Cont meds PRN 4. Mood: Team to provide ego support. LCSW to follow for evaluation and support.  5. Neuropsych: This patient is capable of making decisions on her own behalf. 6. Skin/Wound Care: Routine pressure relief measures. Maintain adequate nutrition and hydration.  7. Fluids/Electrolytes/Nutrition: Monitor strict I/O. Check weights daily. 8. Pyelonephritis: to continue Keflex thorough 3/9 to complete antibiotic course.   Recheck Ucs with insignificant growth on 3/5 9. 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  10 Acute on chronic systolic CHF: Compensated--check daily weights. Continue lasix 20 mg/day. 11. Lupus: On Cellcept and stress dose steroids at this time.  12. DMT2:  lantus 20U  Monitor BS with ac/hs checks and use SSI for now.   Anticipate BS to to be elevated with steroids.   Dietician to educate patient on CM diet.   CBG 157 this am (improved from previous  day) 13. Bipolar disorder with panic attacks: Was on Abilify years ago and most recently has used service dog to help manage symptoms.  14. Anemia of chronic disease?:    Hb 8.3 on 3/6 15.  Constipation adjust laxatives  Increased meds on 3/9  Patient to take suppository today if unable to have a bowel movement  LOS (Days) 5 A FACE TO FACE EVALUATION WAS PERFORMED  Raegen Tarpley Karis Juba 12/25/2015 9:03 AM

## 2015-12-25 NOTE — Progress Notes (Addendum)
Physical Therapy Session Note  Patient Details  Name: Makayla Vasquez MRN: [id number] Date of Birth: [birth year]  Today's Date: 12/25/2015 PT Individual Time: 0759-0829 PT Individual Time Calculation (min): 30 min   Short Term Goals: Week 1:  PT Short Term Goal 1 (Week 1): Patient will perform bed<>chair stand pivot transfer with MinA and RW.  PT Short Term Goal 2 (Week 1): Patient will ambulate 163ft with RW in controlled environment with MinA. PT Short Term Goal 3 (Week 1): Patient will be able to complete car transfer from [facility] with MinA and RW.  PT Short Term Goal 4 (Week 1): Patient will be able to ascend and descend four 6inch steps with Bilateral hand rails. with min A PT Short Term Goal 5 (Week 1): Patient will be able to perform WC mobility in controlled environment for at least 127ft with supervision assist.   Skilled Therapeutic Interventions/Progress Updates:    Patient received sitting EOB. Sit stand and gait in room with supervision assist and RW; cues for AD management into bathroom. PT propelled patient to rehab gym in [facility] for time management. Stairs with lateral step to pattern with eccentric control x 4 steps BLE MinA-Mod A on 6 inch step; increased difficulty with R LE compared to L LE. Toilet transfer x2 throughout treatment with Min A with cues for improved control with descent. Pt left on toilet with NT for possible bowel movement.   02 on 4 L with stair training, following stairs decreased to 82%. <21minunte rest and pursed lip breathing to increase to 95%   Therapy Documentation Precautions:  Precautions Precautions: Fall, ICD/Pacemaker Restrictions Weight Bearing Restrictions: No General:   Vital Signs: Oxygen Therapy SpO2: 97 % Pain: Pain Assessment Pain Assessment: 0-10 Pain Score: 4  Pain Type: Other (Comment) Pain Location: Generalized Pain Orientation: Mid Pain Radiating Towards: abdomin Pain Descriptors / Indicators: Cramping Pain Intervention(s):  Shower;Relaxation;Distraction Mobility:   Locomotion :    Trunk/Postural Assessment :    Balance:   Exercises:   Other Treatments:     See Function Navigator for Current Functional Status.   Therapy/Group: Individual Therapy  Golden Pop 12/25/2015, 12:56 PM

## 2015-12-25 NOTE — Progress Notes (Signed)
Occupational Therapy Session Note  Patient Details  Name: Makayla Vasquez MRN: [id number] Date of Birth: 12-13-1980  Today's Date: 12/25/2015 OT Individual Time: 0930-1015 OT Individual Time Calculation (min): 45 min    Short Term Goals: Week 1:  OT Short Term Goal 1 (Week 1): Pt will complete BUE HEP with supervision for intermittend correction with execution of exercises OT Short Term Goal 2 (Week 1): Pt will complete 3 of 5 grooming tasks while standing supported at sink OT Short Term Goal 3 (Week 1): Pt will complete light housekeeping task using LRAD with min assist to steady OT Short Term Goal 4 (Week 1): Pt will dress with only setup and supevision assist OT Short Term Goal 5 (Week 1): Pt will bathe sitting/standing at shower level with min assist  Skilled Therapeutic Interventions/Progress Updates: ADL-retraining with focus on improved endurance, self-monitoring, dynamic standing balance, and discharge planning.  Pt received supine in bed with complaint of abdominal discomfort from multiple bowel movements and continued bowel management rx.   Pt agreed to showering however was without clean clothing.   On 2L supplemental O2 pt ambulated to bathroom, toileted, transferred to shower and bathed unassisted with distant supervision.   Pt recovered to EOB after donning gown and socks, reporting only continued abdominal discomfort and increased fatigued.   O2/HR assessed at EOB, 75%/108 recovering to 96%/88 within 3 min with vc to perform breathing strategies (diaphragmatic inspiration, pursed-lip expiration).    Pt requested termination of session early d/t fatigue.       Therapy Documentation Precautions:  Precautions Precautions: Fall, ICD/Pacemaker Restrictions Weight Bearing Restrictions: No   General: General OT Amount of Missed Time: 15 Minutes  Vital Signs: Oxygen Therapy SpO2: 97 %   Pain: Pain Assessment Pain Assessment: 0-10 Pain Score: 4  Pain Type: Other  (Comment) Pain Location: Generalized Pain Orientation: Mid Pain Radiating Towards: abdomin Pain Descriptors / Indicators: Cramping Pain Intervention(s): Shower;Relaxation;Distraction  ADL: ADL ADL Comments: See Functional Assessment Tool   See Function Navigator for Current Functional Status.   Therapy/Group: Individual Therapy  Yena Tisby 12/25/2015, 10:19 AM

## 2015-12-26 ENCOUNTER — Encounter: Payer: Medicare Other | Admitting: *Deleted

## 2015-12-26 ENCOUNTER — Inpatient Hospital Stay (HOSPITAL_COMMUNITY): Payer: Medicare Other | Admitting: Occupational Therapy

## 2015-12-26 ENCOUNTER — Inpatient Hospital Stay (HOSPITAL_COMMUNITY): Payer: Medicare Other | Admitting: Physical Therapy

## 2015-12-26 ENCOUNTER — Inpatient Hospital Stay (HOSPITAL_COMMUNITY): Payer: Medicare Other | Admitting: *Deleted

## 2015-12-26 DIAGNOSIS — J189 Pneumonia, unspecified organism: Secondary | ICD-10-CM

## 2015-12-26 DIAGNOSIS — R7309 Other abnormal glucose: Secondary | ICD-10-CM

## 2015-12-26 DIAGNOSIS — F319 Bipolar disorder, unspecified: Secondary | ICD-10-CM

## 2015-12-26 LAB — BASIC METABOLIC PANEL
Anion gap: 15 (ref 5–15)
BUN: 12 mg/dL (ref 6–20)
CALCIUM: 9.1 mg/dL (ref 8.9–10.3)
CO2: 26 mmol/L (ref 22–32)
CREATININE: 0.61 mg/dL (ref 0.44–1.00)
Chloride: 100 mmol/L — ABNORMAL LOW (ref 101–111)
GFR calc Af Amer: >60 mL/min (ref >60)
GLUCOSE: 164 mg/dL — AB (ref 65–99)
Potassium: 4 mmol/L (ref 3.5–5.1)
Sodium: 141 mmol/L (ref 135–145)

## 2015-12-26 LAB — GLUCOSE, CAPILLARY
GLUCOSE-CAPILLARY: 151 mg/dL — AB (ref 65–99)
GLUCOSE-CAPILLARY: 291 mg/dL — AB (ref 65–99)
GLUCOSE-CAPILLARY: 76 mg/dL (ref 65–99)
Glucose-Capillary: 237 mg/dL — ABNORMAL HIGH (ref 65–99)
Glucose-Capillary: 177 mg/dL — ABNORMAL HIGH (ref 65–99)
Glucose-Capillary: 251 mg/dL — ABNORMAL HIGH (ref 65–99)
Glucose-Capillary: 121 mg/dL — ABNORMAL HIGH (ref 65–99)

## 2015-12-26 MED ORDER — FAMOTIDINE 20 MG PO TABS: 20.0000 mg | ORAL_TABLET | Freq: Two times a day (BID) | ORAL | Status: DC

## 2015-12-26 MED ORDER — POLYETHYLENE GLYCOL 3350 17 G PO PACK: 17.0000 g | PACK | Freq: Every day | ORAL | Status: DC

## 2015-12-26 MED ORDER — INSULIN GLARGINE 100 UNIT/ML SOLOSTAR PEN
35.0000 [IU] | PEN_INJECTOR | Freq: Every day | SUBCUTANEOUS | Status: DC
Start: 2015-12-26 — End: 2015-12-27

## 2015-12-26 MED ORDER — CAMPHOR-MENTHOL 0.5-0.5 % EX LOTN: TOPICAL_LOTION | Freq: Two times a day (BID) | CUTANEOUS | Status: DC

## 2015-12-26 MED ORDER — METFORMIN HCL 500 MG PO TABS
1000.0000 mg | ORAL_TABLET | Freq: Two times a day (BID) | ORAL | Status: DC
  Administered 2015-12-26 – 2015-12-27 (×3): 1000 mg via ORAL
  Filled 2015-12-26 (×3): qty 2

## 2015-12-26 MED ORDER — SENNOSIDES-DOCUSATE SODIUM 8.6-50 MG PO TABS: 3.0000 | ORAL_TABLET | Freq: Two times a day (BID) | ORAL | Status: DC

## 2015-12-26 NOTE — Patient Care Conference (Signed)
Inpatient RehabilitationTeam Conference and Plan of Care Update Date: 12/26/2015   Time: 2:15 PM    Patient Name: Makayla Vasquez      Medical Record Number: [id number]  Date of Birth: 1981-07-22 Sex: Female         Room/Bed: 4W24C/4W24C-01 Payor Info: Payor: MEDICARE / Plan: MEDICARE PART A AND B / Product Type: *No Product type* /    Admitting Diagnosis: debility  Admit Date/Time:  12/20/2015  6:32 PM Admission Comments: No comment available   Primary Diagnosis:  <principal problem not specified> Principal Problem: <principal problem not specified>  Patient Active Problem List   Diagnosis Date Noted  . Pneumonitis   . Steroid-induced hyperglycemia   . Slow transit constipation   . Pyelonephritis   . Acute on chronic systolic CHF (congestive heart failure) (HCC)   . Type 2 diabetes mellitus with complication, with long-term current use of insulin (HCC)   . Anemia of chronic disease   . Bipolar affective disorder in remission (HCC)   . Debility 12/20/2015  . Acute respiratory failure (HCC)   . Generalized weakness   . Lower extremity weakness   . Acute on chronic congestive heart failure (HCC)   . AV block   . Acute blood loss anemia   . Supplemental oxygen dependent   . Tachypnea   . Critical illness neuropathy (HCC)   . Steroid myopathy   . HCAP (healthcare-associated pneumonia) 12/18/2015  . Acute pyelonephritis   . Palpitations   . Complete heart block (HCC)   . Cardiac pacemaker in situ   . UTI (urinary tract infection) with pyuria   . Diarrhea 12/14/2015  . Viral gastroenteritis 12/14/2015  . Lower limb pain, anterior   . Calf pain   . Adrenal insufficiency (HCC)   . Acute on chronic diastolic CHF (congestive heart failure) (HCC)   . Arterial hypotension   . SOB (shortness of breath)   . Lupus (systemic lupus erythematosus) (HCC)   . Congenital heart block   . Chronic diastolic congestive heart failure (HCC)   . Hypotension 07/08/2015  . Fatigue 06/05/2015  .  Nausea with vomiting 06/05/2015  . Hyperlipidemia associated with type 2 diabetes mellitus (HCC) 05/04/2015  . Polyarthralgia 04/26/2015  . Rhinosinusitis 04/19/2015  . Screening for cervical cancer 01/19/2015  . Onychomycosis of right great toe 12/14/2014  . Discoloration of skin 12/01/2014  . Lip abscess 11/02/2014  . CHF (congestive heart failure) (HCC)   . Diabetes mellitus type 2 in obese (HCC)   . Hyperglycemia 10/24/2014  . Dehydration   . Sinus tachycardia (HCC)   . Chronic respiratory failure with hypoxia (HCC) 08/07/2014  . Atrial tachycardia (HCC) 07/26/2014  . Insertion of implantable subdermal contraceptive 07/12/2014  . Acute respiratory failure with hypoxia (HCC) 06/19/2014  . ILD (interstitial lung disease) (HCC) 06/19/2014  . Pulmonary infiltrates 06/17/2014  . Dyspnea 06/16/2014  . Hypoxemia 06/16/2014  . Congestive dilated cardiomyopathy (HCC) 05/31/2014  . Nonallergic rhinitis 05/22/2014  . Vaginal itching 05/22/2014  . Pneumonia 05/09/2014  . Shortness of breath 05/08/2014  . Sterilization consult 04/20/2014  . Breast tenderness 01/18/2014  . Carpal tunnel syndrome 09/10/2012  . Constipation 03/03/2012  . Chronic systolic heart failure (HCC) 02/26/2012  . Chest pain, unspecified 12/29/2011  . Post-nasal drip 10/24/2011  . Contraception 01/29/2011  . Heart palpitations   . PPM-Medtronic   . Congenital complete AV block   . Bipolar affective disorder (HCC)   . Obesity   . GERD (gastroesophageal reflux disease)   .  Hypertension 01/05/2011  . THYROGLOSSAL DUCT CYST 07/18/2010  . UNSPECIFIED CARDIAC DYSRHYTHMIA 05/07/2010  . RH FACTOR, NEGATIVE 04/08/2010  . Mild intermittent asthma without complication 01/11/2009  . 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 HEART BLOCK 12/17/2006  . IRRITABLE BOWEL SYNDROME 12/17/2006    Expected Discharge Date: Expected Discharge Date: 12/27/15  Team Members Present: Physician leading conference: Dr. 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, hyperglycemia, constipation, anemia, pneumonitis  resolve infection, improve strength, adjust insulin, monitor labs  See above   Bowel/Bladder   Continent of bowel and bladder; LBM 3/7 after senna, miralax- patient reports she has IBS- constipation; has urinary urgency r/t lasix  Mod I  Assess and treat for constipation as needed   Swallow/Nutrition/ Hydration     na        ADL's   Mod I for  toileting, Supervision for B&D, transfers and dynamic standing balance  Mod I for B &D, Supervision for dynamic standing balance and  bath transfers  Improved activity tolerance, breathing strategies, general strengthening (BUE), improved awareness   Mobility   Mod I for bed mobility, supervision for transfers, gait and w/c  Mod I bed mobility, supervision for balance, gait, stair negotiation with UE support  Strengthening, endurance, balance, gait, D/C planning   Communication     na        Safety/Cognition/ Behavioral Observations    no unsafe behaviors        Pain   Denies pain  < 4  Assess and treat for pain q shift and prn   Skin   skin intact  Mod I  Assess skin q shift and prn      *See Care Plan and progress notes for long and short-term goals.  Barriers to Discharge: Pyelonephritis, hyperglycemia, constipation, anemia, pneumonitis, steroid induced myopathy    Possible Resolutions to Barriers:  Cont abx, adjust insulin, adjust bowel meds, wean steroids    Discharge Planning/Teaching Needs:  Home with boyfriend and 68 yo son, boyfriend has been providing care prior to admission.       Team Discussion:  Pt  reaching her goals of mod/i-supervision level. Boyfriend at home can assist with her care. Activity tolerance improve and strength improved. Constipation resolved. Breathing better doing exercises. DC tomorrow.  Revisions to Treatment Plan:  DC Tomorrow   Continued Need for Acute Rehabilitation Level of Care: The patient requires daily medical management by a physician with specialized training in physical medicine and rehabilitation for the following conditions: Daily direction of a multidisciplinary physical rehabilitation program to ensure safe treatment while eliciting the highest outcome that is of practical value to the patient.: Yes Daily medical management of patient stability for increased activity during participation in an intensive rehabilitation regime.: Yes Daily analysis of laboratory values and/or radiology reports with any subsequent need for medication adjustment of medical intervention for : Cardiac problems;Pulmonary problems;Diabetes problems;Other  Doni Bacha, Lemar Livings 12/26/2015, 2:52 PM

## 2015-12-26 NOTE — Progress Notes (Signed)
Social Work Patient ID: Makayla Vasquez, female   DOB: May 22, 1981, 35 y.o.   MRN: [id number] Met with pt to inform team conference readiness for discharge tomorrow due to met her goals and medically ready. Pt is pleased and ready to go home tomorrow. Pt misses her son and boyfriend.

## 2015-12-26 NOTE — Progress Notes (Signed)
Patient has been educated on importance of medication compliance at home--was not using insulin consistently prior to admission. Will resume home metformin this am and monitor. Has been reporting excessive hunger due to steroids.

## 2015-12-26 NOTE — Progress Notes (Signed)
Occupational Therapy Session Note  Patient Details  Name: Makayla Vasquez MRN: [id number] Date of Birth: 1981/07/19  Today's Date: 12/26/2015 OT Concurrent Time: 1420-1530 OT Concurrent Time Calculation (min): 70 min   Skilled Therapeutic Interventions/Progress Updates:    Pt transferred from supine in bed to wheelchair with supervision using the RW for support.  Had her roll her wheelchair to the therapy gym with modified independence and no rest break to complete task.  Oxygen sats on 3 Ls maintain at 92-94% with activities in the gym and with use of Nustep.  They decreased to 78% on 3Ls when ambulating back to the room at end of session, but increased back to 92 in approximately 1-1 1/2 mins of rest and O2 increased to 4Ls.  In the therapy gym had her use the Nustep for 4 interval of 5 mins.  Resistance placed at level 5 with average number of steps per minute maintained at 35-45.  Had pt alternate between using all extremities to isolating use of just the legs on one set and just the UEs on the next set.  Two sets completed with use of all 4 extremities.  Rest breaks of 2-3 mins needed between sets.  Pt reported rating of 11 on the Borg perceived exertion scale.  Transitioned to the therapy mat from Nustep with close supervision and had pt work on standing endurance while engaged in reaching tasks.  Placed pegs at knee level with emphasis on bending knees and reaching down to pick up a peg and place in in the grid in front of her just above waist level.  Noted pt with decreased flexion of knees and tending to compensate by bending at the waist instead.  External rotation of femurs also noted with standing posture along with hyperextension of the knees.  She was able to tolerate 5 mins of standing before therapist instructed her to sit.  Next had her stand unsupported in order to catch and toss a light beach ball.  She was able to complete this as well with min guard assist, except when the ball was  rolled and she had to retrieve it from the floor, min assist needed to regain balance. Sit to stand during task required min assist when attempting to complete without use of the UEs.  Decreased timing of hip and knee extension present.  Pt ambulated all the way back to room with close supervision using the RW for support.  She transitioned to supine with modified independence.  Call button and phone within reach as well as bed alarm in place.    Therapy Documentation Precautions:  Precautions Precautions: Fall, ICD/Pacemaker Restrictions Weight Bearing Restrictions: No  Vital Signs: Therapy Vitals Temp: 98.2 F (36.8 C) Temp Source: Oral Pulse Rate: (!) 114 (with activity) Resp: 18 BP: 110/60 mmHg Patient Position (if appropriate): Sitting Oxygen Therapy SpO2: 98 % O2 Device: Not Delivered;Nasal Cannula O2 Flow Rate (L/min): 3 L/min Pulse Oximetry Type: Intermittent Pain: Pain Assessment Pain Assessment: No/denies pain  See Function Navigator for Current Functional Status.   Therapy/Group: Individual Therapy  Sharla Tankard OTR/L 12/26/2015, 5:51 PM

## 2015-12-26 NOTE — Progress Notes (Signed)
Occupational Therapy Discharge Summary  Patient Details  Name: Makayla Vasquez MRN: [id number] Date of Birth: 1981-08-30   Patient has met 8 of 9 long term goals due to improved activity tolerance, improved balance, postural control, ability to compensate for deficits and activity tolerance.  Patient to discharge at overall supervision with dynamic standing tasks and mod I at w/c level.  Patient's care partner is independent to provide the necessary physical and cognitive assistance at discharge.    Reasons goals not met: Discharged at patient's request prior to meeting 3rd homemaking goal.  Recommendation:  Patient will benefit from ongoing skilled OT services in home health setting to continue to advance functional skills in the area of BADL, iADL.[name]  Equipment: Tub bench  Reasons for discharge: discharge from hospital  Patient/family agrees with progress made and goals achieved: Yes  OT Discharge Precautions/Restrictions  Precautions Precautions: Fall;ICD/Pacemaker Restrictions Weight Bearing Restrictions: No  Vital Signs Therapy Vitals Pulse Rate: (!) 106 BP: (!) 115/59 mmHg Patient Position (if appropriate): Lying  Pain Pain Assessment Pain Assessment: No/denies pain  ADL ADL ADL Comments: see functional navigator  Vision/Perception  Vision- History Baseline Vision/History: Wears glasses Wears Glasses: At all times Patient Visual Report: No change from baseline   Cognition Overall Cognitive Status: Within Functional Limits for tasks assessed Arousal/Alertness: Awake/alert Memory: Appears intact Problem Solving: Appears intact Safety/Judgment: Appears intact  Sensation Sensation Light Touch:  (reports numbness in hands at times) Stereognosis: Appears Intact Hot/Cold: Appears Intact Proprioception: Appears Intact  Motor  Motor Motor - Discharge Observations: decr cardiopulmonary endurance (but has improved can perform activities on 3 liters and short  break to bring stats >90%  Mobility  Bed Mobility Supine to Sit: 6: Modified independent (Device/Increase time) Sit to Supine: 6: Modified independent (Device/Increase time) Transfers Sit to Stand: 6: Modified independent (Device/Increase time) Stand to Sit: 6: Modified independent (Device/Increase time)   Trunk/Postural Assessment  Cervical Assessment Cervical Assessment: Within Functional Limits Thoracic Assessment Thoracic Assessment: Within Functional Limits Lumbar Assessment Lumbar Assessment: Within Functional Limits   Balance Static Sitting Balance Static Sitting - Level of Assistance: 6: Modified independent (Device/Increase time) Dynamic Sitting Balance Dynamic Sitting - Level of Assistance: 6: Modified independent (Device/Increase time) Static Standing Balance Static Standing - Level of Assistance: 6: Modified independent (Device/Increase time)  Extremity/Trunk Assessment RUE Assessment RUE Assessment:  (4+/5) LUE Assessment LUE Assessment:  (4+/5)   See Function Navigator for Current Functional Status.  Willeen Cass [facility] 12/26/2015, 8:46 AM

## 2015-12-26 NOTE — Progress Notes (Signed)
Physical Therapy Discharge Summary  Patient Details  Name: Makayla Vasquez MRN: [id number] Date of Birth: 07-13-1981  Today's Date: 12/26/2015 PT Individual Time: 1103-1202 PT Individual Time Calculation (min): 59 min    Patient has met 11 of 12 long term goals due to improved activity tolerance, improved balance, improved postural control, increased strength and increased range of motion.  Patient to discharge at an ambulatory level for home distances and [facility] level for community access with Supervision.   Patient's care partner is independent to provide the necessary physical assistance at discharge.  Reasons goals not met: Continued impairments in LE strength, preventing patient from completing floor transfer.   Recommendation:  Patient will benefit from ongoing skilled PT services in home health setting to continue to advance safe functional mobility, address ongoing impairments in Strength, Mobility, ROM, gait, and minimize fall risk.  Equipment: Wheel Passenger transport manager  Reasons for discharge: treatment goals met and discharge from hospital  Patient/family agrees with progress made and goals achieved: Yes    TREATMENT Patient performed grad day activity to assess outcomes toward goals. See below for results. Patient instructed in Gait training for 257f, 1033f 10051fith RW on uneven surfaces, with supervision with cues for AD management with uneven surface.  PT instructed patient in [place] home program with cues for improved safety proper exercise form.  Patient educated in proper stair management technique with 4 inch steps to simulate home environment as well as curb and ramp management.   PT Discharge Precautions/Restrictions Restrictions Weight Bearing Restrictions: No Vital Signs Therapy Vitals Temp: 98.2 F (36.8 C) Temp Source: Oral Pulse Rate: 81 Resp: 18 BP: 110/60 mmHg Patient Position (if appropriate): Lying Oxygen Therapy SpO2: 99 % O2 Device:  Nasal Cannula O2 Flow Rate (L/min): 4 L/min Pain Pain Assessment Pain Assessment: No/denies pain Vision/Perception     Cognition Overall Cognitive Status: Within Functional Limits for tasks assessed Arousal/Alertness: Awake/alert Memory: Appears intact Problem Solving: Appears intact Safety/Judgment: Appears intact Sensation Sensation Light Touch: Appears Intact (reports numbness in hands at times) Stereognosis: Appears Intact Hot/Cold: Appears Intact Proprioception: Appears Intact Motor  Motor Motor - Discharge Observations: Patient demonstrated coordinated movement. Decreased cardiopulmonary status(3L/min with activity, with >90% after short rest break)  Mobility Bed Mobility Supine to Sit: 6: Modified independent (Device/Increase time) Sit to Supine: 6: Modified independent (Device/Increase time) Transfers Sit to Stand: 6: Modified independent (Device/Increase time) Stand to Sit: 6: Modified independent (Device/Increase time) Stand Pivot Transfers: 6: Modified independent (Device/Increase time) Locomotion  Ambulation Ambulation: Yes Ambulation/Gait Assistance: 5: Supervision Ambulation Distance (Feet): 200 Feet Assistive device: Rolling walker Ambulation/Gait Assistance Details: Verbal cues for safe use of DME/AE Ambulation/Gait Assistance Details: Heavy use of RW.  Gait Gait Pattern: Impaired (Decreased step length bilaterally ) Gait velocity: .42m/5mtairs / Additional Locomotion Stairs: Yes Stairs Assistance: 5: Supervision Stairs Assistance Details: Verbal cues for technique;Verbal cues for precautions/safety Stair Management Technique: One rail Right Number of Stairs: 4 Height of Stairs: 4 Ramp: 5: Supervision (with RW) Curb: 5: Supervision (with RW) [employer]s Wheelchair Assistance: 5: SupeInvestment banker, operationalails: Verbal cues for techMarketing executiveth upper extremities Wheelchair Parts  Management: Supervision/cueing Distance: 300ft82frunk/Postural Assessment  Cervical Assessment Cervical Assessment: Within Functional Limits Thoracic Assessment Thoracic Assessment: Within Functional Limits Lumbar Assessment Lumbar Assessment: Within Functional Limits  Balance Static Sitting Balance Static Sitting - Level of Assistance: 6: Modified independent (Device/Increase time) Dynamic Sitting Balance Dynamic Sitting - Level of  Assistance: 6: Modified independent (Device/Increase time) Static Standing Balance Static Standing - Level of Assistance: 6: Modified independent (Device/Increase time) Extremity Assessment      RLE Assessment RLE Assessment: Exceptions to [facility] RLE Strength RLE Overall Strength Comments: Hip flexion 3+/5. 4/5 for all other motions LLE Assessment LLE Assessment: Exceptions to [facility] LLE Strength LLE Overall Strength Comments: 3+/5 for hip flexion. 4/5 for all other motions.    See Function Navigator for Current Functional Status.  Lorie Phenix 12/26/2015, 4:02 PM

## 2015-12-26 NOTE — Progress Notes (Signed)
Recreational Therapy Discharge Summary Patient Details  Name: Makayla Vasquez MRN: [id number] Date of Birth: [birth year] Today's Date: 12/26/2015  Long term goals set: 1  Long term goals met: 1  Comments on progress toward goals: Pt has made good progress during LOS and is anxious for discharge home.  Pt is discharging home at overall Mod I level for TR tasks seated & supervision for standing tasks.  Leisure education provided including energy conservation & activity modification. Reasons for discharge: discharge from hospital  Patient/family agrees with progress made and goals achieved: Yes  Madylyn Insco 12/26/2015, 12:07 PM

## 2015-12-26 NOTE — Progress Notes (Addendum)
Social Work Patient ID: Makayla Vasquez, female   DOB: 02-25-1981, 35 y.o.   MRN: [id number] Will have [facility] bring pt a portable O2 tank due to she gets her O2 from them at home. Spoke with [facility] he will bring her one for tomorrow's discharge.

## 2015-12-26 NOTE — Progress Notes (Signed)
Occupational Therapy Session Note  Patient Details  Name: Makayla Vasquez MRN: [id number] Date of Birth: 1981-05-12  Today's Date: 12/26/2015 OT Individual Time: 2094-7096 OT Individual Time Calculation (min): 30 min    Short Term Goals: Week 1:  OT Short Term Goal 1 (Week 1): Pt will complete BUE HEP with supervision for intermittend correction with execution of exercises OT Short Term Goal 2 (Week 1): Pt will complete 3 of 5 grooming tasks while standing supported at sink OT Short Term Goal 3 (Week 1): Pt will complete light housekeeping task using LRAD with min assist to steady OT Short Term Goal 4 (Week 1): Pt will dress with only setup and supevision assist OT Short Term Goal 5 (Week 1): Pt will bathe sitting/standing at shower level with min assist  Skilled Therapeutic Interventions/Progress Updates: Patient participated in skilled OT as follows:   Cooked oatmeal on stove walker level with distant supervision.  As well, she cleaned and straightened drawers and lightly wiped counters and tables in the room without requiring the use of her assistive device for funtional mobility.  As well, she completed laundry task with distant S.  She was able to put clothes in the washer and retrieve dry one out and set on top of the dryer in the small approx 3 x5 foot rehab laundry space.  She did leave the walker close by in case she needed it for support.  Patient was left sitting edge of bed with calll bell in place at the end of the session.     Therapy Documentation Precautions:  Precautions Precautions: Fall, ICD/Pacemaker Restrictions Weight Bearing Restrictions: No  Pain: denied    ADL ADL Comments: see functional navigator  See Function Navigator for Current Functional Status.   Therapy/Group: Individual Therapy  Bud Face [facility] 12/26/2015, 8:11 PM

## 2015-12-26 NOTE — Progress Notes (Signed)
Recreational Therapy Session Note  Patient Details  Name: Makayla Vasquez MRN: [id number] Date of Birth: [birth year] Today's Date: 12/26/2015  Pain: no c/o Skilled Therapeutic Interventions/Progress Updates: Pt participated in moderately complex TR task seated with mod I, ambulated using RW with supervision.  Pt is anxious to get home. Darryl Blumenstein 12/26/2015, 12:05 PM

## 2015-12-26 NOTE — Progress Notes (Addendum)
[place] PHYSICAL MEDICINE & REHABILITATION     PROGRESS NOTE  Subjective/Complaints:  Pt seen standing up this AM independently putting on her pants.  She does not have any complaints.  She notes she did have a BM yesterday.  ROS:  Denies CP, SOB, n/v/d.  Objective: Vital Signs: Blood pressure 115/59, pulse 106, temperature 98.4 F (36.9 C), temperature source Oral, resp. rate 18, height 5\' 5"  (1.651 m), weight 73 kg (160 lb 15 oz), SpO2 99 %. No results found.  Recent Labs  12/24/15 0432  WBC 7.6  HGB 8.3*  HCT 28.5*  PLT 347    Recent Labs  12/24/15 0432  NA 137  K 4.5  CL 103  GLUCOSE 376*  BUN 11  CREATININE 0.60  CALCIUM 8.4*   CBG (last 3)   Recent Labs  12/25/15 1645 12/25/15 2044 12/26/15 0652  GLUCAP 151* 237* 291*    Wt Readings from Last 3 Encounters:  12/26/15 73 kg (160 lb 15 oz)  12/20/15 77.61 kg (171 lb 1.6 oz)  12/17/15 78.5 kg (173 lb 1 oz)    Physical Exam:  BP 115/59 mmHg  Pulse 106  Temp(Src) 98.4 F (36.9 C) (Oral)  Resp 18  Ht 5\' 5"  (1.651 m)  Wt 73 kg (160 lb 15 oz)  BMI 26.78 kg/m2  SpO2 99% Constitutional: She appears well-developed and well-nourished. NAD. Vital signs reviewed.  HENT: Normocephalic and atraumatic.  Mouth/Throat: Oropharynx is clear and moist. Dental caries present.  Eyes: Conjunctivae and EOM are normal. Neck: Normal range of motion. Neck supple.  Cardiovascular: Normal rate and regular rhythm.  Respiratory: Effort normal. No accessory muscle usage. No respiratory distress. She has decreased breath sounds  GI: Soft. Bowel sounds are normal. She exhibits no distension. There is no tenderness.  Musculoskeletal: She exhibits no edema or tenderness.  Bilateral hands with evidence of muscle wasting.  Neurological: She is alert and oriented.  Speech clear and able to follow commands without difficulty.  Motor: B/L UE: Shoulder abduction 4+/5, elbow flexion/extension 4/5, hand grip 4+/5 B/l LE: Hip  flexion 3-/5, distally 5/5 Skin: Skin is warm and dry.  Psychiatric: She has a normal mood and affect. Her speech is normal and behavior is normal. Thought content normal. Her mood appears not anxious.    Assessment/Plan: 1. 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 Physiatrist is providing close team supervision and 24 hour management of active medical problems listed below. Physiatrist and rehab team continue to assess barriers to discharge/monitor patient progress toward functional and medical goals.  Function:  Bathing Bathing position   Position: Sitting EOB  Bathing parts Body parts bathed by patient: Right arm, Left arm, Chest, Abdomen, Front perineal area, Right upper leg, Left upper leg, Buttocks, Right lower leg, Left lower leg, Back Body parts bathed by helper: Back  Bathing assist Assist Level: More than reasonable time      Upper Body Dressing/Undressing Upper body dressing   What is the patient wearing?: Pull over shirt/dress     Pull over shirt/dress - Perfomed by patient: Thread/unthread right sleeve, Thread/unthread left sleeve, Put head through opening, Pull shirt over trunk          Upper body assist Assist Level: More than reasonable time   Set up : To obtain clothing/put away  Lower Body Dressing/Undressing Lower body dressing   What is the patient wearing?:  Underwear, Pants, Non-skid slipper socks Underwear - Performed by patient: Thread/unthread right underwear leg, Thread/unthread left underwear leg, Pull underwear up/down   Pants- Performed by patient: Thread/unthread right pants leg, Thread/unthread left pants leg, Pull pants up/down, Fasten/unfasten pants   Non-skid slipper socks- Performed by patient: Don/doff right sock, Don/doff left sock                    Lower body assist Assist for lower  body dressing: More than reasonable time      Toileting Toileting   Toileting steps completed by patient: Adjust clothing prior to toileting, Performs perineal hygiene, Adjust clothing after toileting      Toileting assist Assist level: Supervision or verbal cues   Transfers Chair/bed transfer   Chair/bed transfer method: Ambulatory Chair/bed transfer assist level: No Help, no cues, assistive device, takes more than a reasonable amount of time Chair/bed transfer assistive device: Patent attorney     Max distance: 200 Assist level: Supervision or verbal cues   Wheelchair   Type: Manual Max wheelchair distance: 284ft Assist Level: Supervision or verbal cues  Cognition Comprehension Comprehension assist level: Follows complex conversation/direction with no assist  Expression Expression assist level: Expresses complex ideas: With no assist  Social Interaction Social Interaction assist level: Interacts appropriately with others - No medications needed.  Problem Solving Problem solving assist level: Solves complex problems: Recognizes & self-corrects  Memory Memory assist level: Complete Independence: No helper    Medical Problem List and Plan: 1. 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 DVT Prophylaxis/Anticoagulation: Pharmaceutical: Lovenox 3. Pain Management: Cont meds PRN 4. Mood: Team to provide ego support. LCSW to follow for evaluation and support.  5. Neuropsych: This patient is capable of making decisions on her own behalf. 6. Skin/Wound Care: Routine pressure relief measures. Maintain adequate nutrition and hydration.  7. Fluids/Electrolytes/Nutrition: Monitor strict I/O. Check weights daily. 8. Pyelonephritis: to continue Keflex thorough 3/9 to complete antibiotic course.   Recheck Ucs with insignificant growth on 3/5 9. 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  10 Acute on chronic systolic CHF: Compensated--check daily weights. Continue lasix 20 mg/day. 11. Lupus: On Cellcept and stress dose steroids at this time.  12. DMT2:  lantus 20U  Monitor BS with ac/hs checks and use SSI for now.   Anticipate BS to to be elevated with steroids.   Dietician to educate patient on CM diet.   CBG 291 this am, however, pt steroids decreased today 13. Bipolar disorder with panic attacks: Was on Abilify years ago and most recently has used service dog to help manage symptoms.  14. Anemia of chronic disease?:    Hb 8.3 on 3/6 15.  Constipation adjust laxatives  Increased meds on 3/9  Pt had BM yesterday  LOS (Days) 6 A FACE TO FACE EVALUATION WAS PERFORMED  Gaia Gullikson Karis Juba 12/26/2015 9:50 AM

## 2015-12-26 NOTE — Progress Notes (Signed)
Social Work Lucy Chris, LCSW Social Worker Signed  Patient Care Conference 12/26/2015  1:20 PM    Expand All Collapse All   Inpatient RehabilitationTeam Conference and Plan of Care Update Date: 12/26/2015   Time: 2:15 PM     Patient Name: Makayla Vasquez       Medical Record Number: [id number]  Date of Birth: [birth year] Sex: Female         Room/Bed: 4W24C/4W24C-01 Payor Info: Payor: MEDICARE / Plan: MEDICARE PART A AND B / Product Type: *No Product type* /    Admitting Diagnosis: debility   Admit Date/Time:  12/20/2015  6:32 PM Admission Comments: No comment available   Primary Diagnosis:  <principal problem not specified> Principal Problem: <principal problem not specified>    Patient Active Problem List     Diagnosis  Date Noted   .  Pneumonitis     .  Steroid-induced hyperglycemia     .  Slow transit constipation     .  Pyelonephritis     .  Acute on chronic systolic CHF (congestive heart failure) (HCC)     .  Type 2 diabetes mellitus with complication, with long-term current use of insulin (HCC)     .  Anemia of chronic disease     .  Bipolar affective disorder in remission (HCC)     .  Debility  12/20/2015   .  Acute respiratory failure (HCC)     .  Generalized weakness     .  Lower extremity weakness     .  Acute on chronic congestive heart failure (HCC)     .  AV block     .  Acute blood loss anemia     .  Supplemental oxygen dependent     .  Tachypnea     .  Critical illness neuropathy (HCC)     .  Steroid myopathy     .  HCAP (healthcare-associated pneumonia)  12/18/2015   .  Acute pyelonephritis     .  Palpitations     .  Complete heart block (HCC)     .  Cardiac pacemaker in situ     .  UTI (urinary tract infection) with pyuria     .  Diarrhea  12/14/2015   .  Viral gastroenteritis  12/14/2015   .  Lower limb pain, anterior     .  Calf pain     .  Adrenal insufficiency (HCC)     .  Acute on chronic diastolic CHF (congestive heart failure) (HCC)     .   Arterial hypotension     .  SOB (shortness of breath)     .  Lupus (systemic lupus erythematosus) (HCC)     .  Congenital heart block     .  Chronic diastolic congestive heart failure (HCC)     .  Hypotension  07/08/2015   .  Fatigue  06/05/2015   .  Nausea with vomiting  06/05/2015   .  Hyperlipidemia associated with type 2 diabetes mellitus (HCC)  05/04/2015   .  Polyarthralgia  04/26/2015   .  Rhinosinusitis  04/19/2015   .  Screening for cervical cancer  01/19/2015   .  Onychomycosis of right great toe  12/14/2014   .  Discoloration of skin  12/01/2014   .  Lip abscess  11/02/2014   .  CHF (congestive heart failure) (HCC)     .  Diabetes  mellitus type 2 in obese (HCC)     .  Hyperglycemia  10/24/2014   .  Dehydration     .  Sinus tachycardia (HCC)     .  Chronic respiratory failure with hypoxia (HCC)  08/07/2014   .  Atrial tachycardia (HCC)  07/26/2014   .  Insertion of implantable subdermal contraceptive  07/12/2014   .  Acute respiratory failure with hypoxia (HCC)  06/19/2014   .  ILD (interstitial lung disease) (HCC)  06/19/2014   .  Pulmonary infiltrates  06/17/2014   .  Dyspnea  06/16/2014   .  Hypoxemia  06/16/2014   .  Congestive dilated cardiomyopathy (HCC)  05/31/2014   .  Nonallergic rhinitis  05/22/2014   .  Vaginal itching  05/22/2014   .  Pneumonia  05/09/2014   .  Shortness of breath  05/08/2014   .  Sterilization consult  04/20/2014   .  Breast tenderness  01/18/2014   .  Carpal tunnel syndrome  09/10/2012   .  Constipation  03/03/2012   .  Chronic systolic heart failure (HCC)  40/98/1191   .  Chest pain, unspecified  12/29/2011   .  Post-nasal drip  10/24/2011   .  Contraception  01/29/2011   .  Heart palpitations     .  PPM-Medtronic     .  Congenital complete AV block     .  Bipolar affective disorder (HCC)     .  Obesity     .  GERD (gastroesophageal reflux disease)     .  Hypertension  01/05/2011   .  THYROGLOSSAL DUCT CYST  07/18/2010   .   UNSPECIFIED CARDIAC DYSRHYTHMIA  05/07/2010   .  RH FACTOR, NEGATIVE  04/08/2010   .  Mild intermittent asthma without complication  01/11/2009   .  POLYCYSTIC OVARY  12/17/2006   .  HEART BLOCK  12/17/2006   .  IRRITABLE BOWEL SYNDROME  12/17/2006     Expected Discharge Date: Expected Discharge Date: 12/27/15  Team Members Present: Physician leading conference: Dr. Maryla Morrow Social Worker Present: Dossie Der, LCSW Nurse Present: Carmie End, RN PT Present: Other (comment);Edman Circle, PT (Grier Rocher & Aleda Grana) OT Present: [name], OT SLP Present: Jackalyn Lombard, SLP PPS Coordinator present : Tora Duck, RN, CRRN        Current Status/Progress  Goal  Weekly Team Focus   Medical     Paraparesis secondary to Steroid myopathy with pyelonephritis, hyperglycemia, constipation, anemia, pneumonitis   resolve infection, improve strength, adjust insulin, monitor labs  See above   Bowel/Bladder     Continent of bowel and bladder; LBM 3/7 after senna, miralax- patient reports she has IBS- constipation; has urinary urgency r/t lasix   Mod I  Assess and treat for constipation as needed    Swallow/Nutrition/ Hydration       na         ADL's     Mod I for  toileting, Supervision for B&D, transfers and dynamic standing balance  Mod I for B &D, Supervision for dynamic standing balance and  bath transfers  Improved activity tolerance, breathing strategies, general strengthening (BUE), improved awareness   Mobility     Mod I for bed mobility, supervision for transfers, gait and w/c   Mod I bed mobility, supervision for balance, gait, stair negotiation with UE support  Strengthening, endurance, balance, gait, D/C planning    Communication  na         Safety/Cognition/ Behavioral Observations      no unsafe behaviors         Pain     Denies pain  < 4  Assess and treat for pain q shift and prn   Skin     skin intact  Mod I  Assess skin q shift and prn      *See Care  Plan and progress notes for long and short-term goals.    Barriers to Discharge:  Pyelonephritis, hyperglycemia, constipation, anemia, pneumonitis, steroid induced myopathy     Possible Resolutions to Barriers:   Cont abx, adjust insulin, adjust bowel meds, wean steroids      Discharge Planning/Teaching Needs:   Home with boyfriend and 29 yo son, boyfriend has been providing care prior to admission.        Team Discussion:    Pt reaching her goals of mod/i-supervision level. Boyfriend at home can assist with her care. Activity tolerance improve and strength improved. Constipation resolved. Breathing better doing exercises. DC tomorrow.   Revisions to Treatment Plan:    DC Tomorrow    Continued Need for Acute Rehabilitation Level of Care: The patient requires daily medical management by a physician with specialized training in physical medicine and rehabilitation for the following conditions: Daily direction of a multidisciplinary physical rehabilitation program to ensure safe treatment while eliciting the highest outcome that is of practical value to the patient.: Yes Daily medical management of patient stability for increased activity during participation in an intensive rehabilitation regime.: Yes Daily analysis of laboratory values and/or radiology reports with any subsequent need for medication adjustment of medical intervention for : Cardiac problems;Pulmonary problems;Diabetes problems;Other  Lucy Chris 12/26/2015, 2:52 PM                  Patient ID: [name], female   DOB: 04-19-1981, 35 y.o.   MRN: [id number]

## 2015-12-26 NOTE — Progress Notes (Signed)
Occupational Therapy Session Note  Patient Details  Name: Makayla Vasquez MRN: [id number] Date of Birth: Dec 11, 1980  Today's Date: 12/26/2015 OT Individual Time: 0800-0900 OT Individual Time Calculation (min): 60 min    Short Term Goals: Week 1:  OT Short Term Goal 1 (Week 1): Pt will complete BUE HEP with supervision for intermittend correction with execution of exercises OT Short Term Goal 2 (Week 1): Pt will complete 3 of 5 grooming tasks while standing supported at sink OT Short Term Goal 3 (Week 1): Pt will complete light housekeeping task using LRAD with min assist to steady OT Short Term Goal 4 (Week 1): Pt will dress with only setup and supevision assist OT Short Term Goal 5 (Week 1): Pt will bathe sitting/standing at shower level with min assist  Skilled Therapeutic Interventions/Progress Updates:    GRADDAy!  Pt already participated in bathing and dressing EOB at mod I level before therapist arrived.  Focus on functional mobility around her room, gym and unit demonstrate safe RW use and management of someof O2 tubing. Pt able to verbalize 3 energy conservation techniques and how she can incorporate them into her ADL routine. Discussion and education on d/c planning and f/u therapy.    Performed therapeutic activity with focus on overall cardiopulmonary endurance wiith Nustep for 8 min, bouncing weighted ball against trampoline, [employer] in standing.  Pt reports trying to wean off oxygen; at home she was on 4 liters. During session performed all activities on 3 liters and able to maintain stats at 90% or above- except for one time when destat to 85% but able to recover in less than a min.    Therapy Documentation Precautions:  Precautions Precautions: Fall, ICD/Pacemaker Restrictions Weight Bearing Restrictions: No General:   Vital Signs: Therapy Vitals Pulse Rate: (!) 106 BP: (!) 115/59 mmHg Patient Position (if appropriate): Lying Pain: Pain Assessment Pain Assessment:  No/denies pain ADL: ADL ADL Comments: see functional navigator Exercises:   Other Treatments:    See Function Navigator for Current Functional Status.   Therapy/Group: Individual Therapy  [name] [facility] 12/26/2015, 8:49 AM

## 2015-12-27 LAB — GLUCOSE, CAPILLARY
GLUCOSE-CAPILLARY: 91 mg/dL (ref 65–99)
GLUCOSE-CAPILLARY: 120 mg/dL — AB (ref 65–99)
Glucose-Capillary: 66 mg/dL (ref 65–99)

## 2015-12-27 LAB — CREATININE, SERUM
CREATININE: 0.52 mg/dL (ref 0.44–1.00)
GFR calc Af Amer: >60 mL/min (ref >60)
GFR calc non Af Amer: >60 mL/min (ref >60)

## 2015-12-27 MED ORDER — PREDNISONE 20 MG PO TABS: ORAL_TABLET | ORAL | Status: DC

## 2015-12-27 MED ORDER — WHITE PETROLATUM GEL
Status: AC
  Administered 2015-12-27: 10:00:00
  Filled 2015-12-27: qty 1

## 2015-12-27 MED ORDER — ONETOUCH ULTRA 2 W/DEVICE KIT: PACK | Status: DC

## 2015-12-27 NOTE — Progress Notes (Signed)
12/27/15 1410 nursing Patient discharged to home per wheelchair accompanied by NT and significant other. 02 at 4L/min patient is 02 dependent. Discharge instructions given by Pam PA no further questions noted. Per patient she got her flu vaccine this year  last September and got  her pneumonia  vaccine in the last 5 years. Per patient the vaccine is not due yet.

## 2015-12-27 NOTE — Discharge Summary (Signed)
Physician Discharge Summary  Patient ID: Makayla Vasquez MRN: [id number] DOB/AGE: 36-08-1981 36 y.o.  Admit date: 12/20/2015 Discharge date: 12/27/2015  Discharge Diagnoses:  Principal Problem:   Steroid myopathy Active Problems:   GERD (gastroesophageal reflux disease)   Debility   Pyelonephritis   Acute on chronic systolic CHF (congestive heart failure) ([place])   Type 2 diabetes mellitus with complication, with long-term current use of insulin (HCC)   Anemia of chronic disease   Bipolar affective disorder in remission ([place])   Slow transit constipation   Discharged Condition: stable  Significant Diagnostic Studies: Dg Chest 2 View  12/19/2015  CLINICAL DATA:  Recent admit for UTI EXAM: CHEST  2 VIEW COMPARISON:  12/18/2015 FINDINGS: Cardiac shadow is mildly enlarged. A pacing device is again seen. Patchy infiltrates are again noted throughout both lungs although some mild improved aeration is seen. The infiltrative changes are noted worst in the right lung base. No acute bony abnormality is noted. IMPRESSION: Slight improvement in bilateral infiltrates Electronically Signed   By: Inez Catalina M.D.   On: 12/19/2015 08:02   Dg Chest 2 View  12/18/2015  CLINICAL DATA:  Cough, lupus, history of asthma EXAM: CHEST  2 VIEW COMPARISON:  CT chest 12/10/2015 FINDINGS: There are bilateral interstitial and alveolar airspace opacities. There are bilateral small pleural effusions. There is no pneumothorax. The heart and mediastinal contours are unremarkable. There is a cardiac pacemaker. The osseous structures are unremarkable. IMPRESSION: Bilateral interstitial and alveolar airspace opacities which may reflect pulmonary edema versus multi lobar pneumonia. Electronically Signed   By: Kathreen Devoid   On: 12/18/2015 14:07   Dg Chest 2 View  12/09/2015  CLINICAL DATA:  Shortness of breath, cough, chest congestion and CHF. Initial encounter. EXAM: CHEST  2 VIEW COMPARISON:  Most recent chest imaging  radiographs 10/05/2015, CT 07/08/2015 reviewed. FINDINGS: Left-sided pacemaker remains in place. Cardiomegaly, with mild improvement from prior. Persistent but improved central pulmonary edema. No consolidation to suggest pneumonia. No pleural effusion or pneumothorax. Osseous structures are unchanged. IMPRESSION: Mild CHF which appears chronic, with improvement compared to prior exam. Electronically Signed   By: Jeb Levering M.D.   On: 12/09/2015 23:08   Dg Lumbar Spine Complete  12/18/2015  CLINICAL DATA:  Lower extremity weakness for 2 months. EXAM: LUMBAR SPINE - COMPLETE 4+ VIEW COMPARISON:  CT, 12/14/2015 FINDINGS: L5 is a transitional lumbosacral vertebrae. There is no fracture spondylolisthesis. There are no bone lesions. No degenerative changes. Mild straightening of the normal lumbar lordosis unchanged from the CT. Soft tissues are unremarkable. IMPRESSION: 1. No fracture or acute finding. 2. Mild straightening of the normal lumbar lordosis and transitional lumbosacral vertebrae. Otherwise unremarkable. Electronically Signed   By: Lajean Manes M.D.   On: 12/18/2015 23:44   Ct Angio Chest Pe W/cm &/or Wo Cm  12/10/2015  CLINICAL DATA:  Initial evaluation for acute shortness of breath. History of lupus. EXAM: CT ANGIOGRAPHY CHEST WITH CONTRAST TECHNIQUE: Multidetector CT imaging of the chest was performed using the standard protocol during bolus administration of intravenous contrast. Multiplanar CT image reconstructions and MIPs were obtained to evaluate the vascular anatomy. CONTRAST:  10m OMNIPAQUE IOHEXOL 350 MG/ML SOLN COMPARISON:  Prior radiograph from 12/09/2015. FINDINGS: Visualized thyroid gland normal. No pathologically enlarged mediastinal or axillary adenopathy. Mild soft tissue fullness within the bilateral hila similar to previous, which may related to lymphoid tissue. Intrathoracic aorta of normal caliber and appearance. Great vessels within normal limits. Cardiomegaly with noted.  Trace pericardial effusion.  Left-sided pacemaker in place. Evaluation of the pulmonary arteries somewhat limited by body habitus and motion artifact. Main pulmonary artery measures within normal limits at 2.2 cm in diameter. No central or segmental filling defect to suggest acute pulmonary embolus identified. Re-formatted imaging confirms these findings. Evaluation for small distal emboli sub 1 limited on this examination. Diffuse severe ground-glass attenuation, relatively bilateral and symmetric, with predominance in the mid and lower lungs. Air bronchograms extend from the perihilar regions to the periphery. More nodular densities within the bilateral upper lungs. Possible trace bilateral pleural effusions. These findings are relatively similar relative to most recent CT from 07/08/2015. Visualized upper abdomen demonstrates no acute abnormality. Possible hepatic steatosis. No acute osseous abnormality. No worrisome lytic or blastic osseous lesions. IMPRESSION: 1. No CT evidence for acute pulmonary embolism. Please note that evaluation for possible small -distal pulmonary emboli somewhat limited due to body habitus and extensive bilateral consolidation within the lungs. 2. Severe bilaterally symmetric basilar predominant ground-glass consolidation with air bronchograms, overall similar in appearance relative to most recent CT from 07/08/2015. Possible underlying chronic lung disease related to interstitial pneumonitis or possibly history of lupus/autoimmune disorder could be considered. 3. Cardiomegaly without definite evidence for pulmonary edema. Electronically Signed   By: Jeannine Boga M.D.   On: 12/10/2015 02:23   Ct Abdomen Pelvis W Contrast  12/14/2015  CLINICAL DATA:  Initial evaluation for generalized abdominal pain. EXAM: CT ABDOMEN AND PELVIS WITH CONTRAST TECHNIQUE: Multidetector CT imaging of the abdomen and pelvis was performed using the standard protocol following bolus administration of  intravenous contrast. CONTRAST:  117m OMNIPAQUE IOHEXOL 300 MG/ML  SOLN COMPARISON:  Prior CT from 02/25/2013. FINDINGS: There are extensive ground-glass opacities with air bronchograms throughout the partially visualized lungs, highly suspicious for possible infection. A component of pulmonary edema may be present as well, although no significant pleural effusion identified. Pacemaker electrodes partially visualized. No pericardial effusion. Liver demonstrates a normal contrast enhanced appearance. Gallbladder absent. No biliary dilatation. Spleen, adrenal glands, and pancreas demonstrate a normal contrast enhanced appearance. Kidneys are equal in size with symmetric enhancement. No nephrolithiasis or hydronephrosis. No focal enhancing renal mass. Stomach within normal limits. No evidence for bowel obstruction. No wall thickening, mucosal enhancement, or inflammatory fat stranding seen about the bowels. Appendix well visualized in the right abdomen and is of normal caliber and appearance without associated inflammatory changes to suggest acute appendicitis. Fluid level present within the lumen of the distal colon/rectum, which may reflect active diarrheal illness. Bladder partially distended but grossly unremarkable. Uterus within normal limits. Ovaries normal for patient age. No free air or fluid. No pathologically enlarged intra-abdominal pelvic lymph nodes. Normal intravascular enhancement seen throughout the intra-abdominal aorta and its branch vessels. No acute osseous abnormality. No worrisome lytic or blastic osseous lesions. 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 IMPRESSION: 1. Fluid level within the lumen of the distal colon, suggesting possible active diarrheal illness. No CT evidence for other acute intra-abdominal or pelvic process. 2. Extensive ground-glass opacities seen throughout the partially visualized lung bases. Findings may reflect sequela of infection or possibly edema. 3. Status post  cholecystectomy. Electronically Signed   By: BJeannine BogaM.D.   On: 12/14/2015 06:21    Labs:  Basic Metabolic Panel:  Recent Labs Lab 12/24/15 0432 12/26/15 1357 12/27/15 0431  NA 137 141  --   K 4.5 4.0  --   CL 103 100*  --   CO2 27 26  --   GLUCOSE 376* 164*  --  BUN 11 12  --   CREATININE 0.60 0.61 0.52  CALCIUM 8.4* 9.1  --     CBC:  Recent Labs Lab 12/24/15 0432  WBC 7.6  NEUTROABS 6.0  HGB 8.3*  HCT 28.5*  MCV 83.3  PLT 347    CBG:  Recent Labs Lab 12/26/15 1636 12/26/15 2114 12/27/15 0655 12/27/15 0731 12/27/15 1152  GLUCAP 251* 121* 66 91 120*    Brief HPI:   Makayla Vasquez is a 35 year old female with history of lupus, systolic CHF, chronic hypoxic respiratory failure--4 lites oxygen per [place], 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 She was readmitted the next day on 02/28 with leucocytosis, BLE weakness and SOB due to acute on chronic CHF and HCAP. She was placed on IV antibiotics and treated with gentle diuresis. Cardiac enzymes noted to be mildly elevated due to demand ischemia and Dr. Titus Mould consulted for input on dyspnea and recommended increase in prednisone to 30 mg bid with slow taper. Antibiotics narrowed to treat UTI/Pyel and  respiratory status is improving. She continues to be limited by BLE weakness and fatigue. CIR recommended for follow up therapy.    Hospital Course: STEPHONIE WILCOXEN was admitted to rehab 12/20/2015 for inpatient therapies to consist of PT and OT at least three hours five days a week. Past admission physiatrist, therapy team and rehab RN have worked together to provide customized collaborative inpatient rehab.   Respiratory status and endurance has improved. CHF has been compensated with weights down to 74 kg.  Pepcid was added to help with GERD symptoms and po intake has  improved. Diabetes has been monitored on ac/hs basis  and blood sugars started trending upwards due to steroids as well as improved intake. Lantus was titrated to 20 units daily and metformin was resumed.  In the last 24 hours, she has had hypoglycemic episodes due to decrease in prednisone dose. Lantus was discontinued and patient was instructed to monitor BS on ac/hs basis and contact MD if these were > 150 consistently.  She was maintained on keflex for treatment of UTI thorough 03/09 and no complaints of dysuria reported. She has made steady progress during her rehab stay and is independent at discharge.   During patient's stay in rehab weekly team conferences were held to monitor patient's progress, set goals and discuss barriers to discharge. At admission, patient required min to moderate assistance with ADL tasks and max assist with mobility.  She has had improvement in activity tolerance, balance, postural control, as well as ability to compensate for deficits. She is able to complete ADL tasks independently and is able to ambulate household distances with RW.  Hypoxia continues to be a limiting factor for ambulating longer distances and she was advised to use WC in community setting.  She will continue to receive follow up [place], [place] and [place] [facility] [place] after discharge.    Disposition:  Home   Diet: Diabetic. Low salt.   Special Instructions: 1. Check blood sugars before meals and at bedtime. Do not use insulin for now. Call MD if your blood sugars start running over 150. 2. Follow prednisone taper as instructed. 3. Check weights daily       Discharge Instructions    Ambulatory referral to Physical Medicine Rehab    Complete by:  As directed   Critical illness  myopathy?  Moderate complexity            Medication List    STOP taking these medications        bisoprolol 5 MG tablet  Commonly known as:  ZEBETA     cephALEXin 500 MG capsule  Commonly known as:  KEFLEX      GLUCERNA ADVANCE SHAKE Liqd     Insulin Glargine 100 UNIT/ML Solostar Pen  Commonly known as:  LANTUS SOLOSTAR     Insulin Pen Needle 32G X 4 MM Misc  Commonly known as:  INSUPEN PEN NEEDLES     oxyCODONE-acetaminophen 5-325 MG tablet  Commonly known as:  PERCOCET     potassium chloride SA 20 MEQ tablet  Commonly known as:  KLOR-CON M20      TAKE these medications        albuterol 108 (90 Base) MCG/ACT inhaler  Commonly known as:  PROVENTIL HFA;VENTOLIN HFA  Inhale 2 puffs into the lungs every 4 (four) hours as needed for wheezing or shortness of breath.     atorvastatin 40 MG tablet  Commonly known as:  LIPITOR  Take 1 tablet (40 mg total) by mouth daily.     budesonide-formoterol 80-4.5 MCG/ACT inhaler  Commonly known as:  SYMBICORT  Inhale 2 puffs into the lungs 2 (two) times daily as needed (for shortness of breath).     calcium-vitamin D 500-200 MG-UNIT tablet  Commonly known as:  OSCAL WITH D  Take 1 tablet by mouth daily with breakfast.     camphor-menthol lotion  Commonly known as:  SARNA  Apply topically 2 (two) times daily.     famotidine 20 MG tablet  Commonly known as:  PEPCID  Take 1 tablet (20 mg total) by mouth 2 (two) times daily.     fluocinonide 0.05 % external solution  Commonly known as:  LIDEX  1 APPLICATION APPLY ON THE SKIN AS DIRECTED APPLY TO AFFECTED AREAS DAILY ON SCALP AS NEEDED FOR ITCHING.     furosemide 20 MG tablet  Commonly known as:  LASIX  Take 20 mg by mouth daily.     glucose blood test strip  Commonly known as:  ONE TOUCH ULTRA TEST  Check blood sugar daily before breakfast     metFORMIN 1000 MG tablet  Commonly known as:  GLUCOPHAGE  TAKE 1 TABLET BY MOUTH TWICE A DAY WITH A MEAL     mycophenolate 500 MG tablet  Commonly known as:  CELLCEPT  Take 500 mg by mouth 2 (two) times daily.     NEXPLANON 68 MG Impl implant  Generic drug:  etonogestrel  1 each by Subdermal route once.     ONE TOUCH ULTRA 2 w/Device  Kit  Check blood sugar before meals and at bedtim     polyethylene glycol packet  Commonly known as:  MIRALAX / GLYCOLAX  Take 17 g by mouth daily after supper.     predniSONE 20 MG tablet  Commonly known as:  DELTASONE  From 3/9-3/10: Take 30 mg in am and 20 mg in pm From 3/11-3/14: Take 20 mg in am and 20 mg in pm From 3/15- 3/18 Take 20 mg in am and 10 mg in pm From 3/19-3/22 Take 10 mg in am and 10 mg in pm From 3/22 on Take 10 mg daily     senna-docusate 8.6-50 MG tablet  Commonly known as:  Senokot-S  Take 3 tablets by mouth 2 (two) times daily.  Follow-up Information    Follow up with Nobie Putnam, DO On 01/07/2016.   Specialty:  Osteopathic Medicine   Why:  APPT @ 8:45 Am   Contact information:   [address] [place] [address] [phone]       Follow up with Ankit Lorie Phenix, MD.   Specialty:  Physical Medicine and Rehabilitation   Why:  office will call you with appointment   Contact information:   [place]  [id number] [phone]       Follow up with Deneise Lever, MD. Call today.   Specialty:  Pulmonary Disease   Why:  for follow up appointment   Contact information:   [place]  [id number] [phone]       Signed: [name] 12/27/2015, 10:10 PM

## 2015-12-27 NOTE — Progress Notes (Signed)
Hypoglycemic Event  CBG: 66 Treatment: OJ,graham crackers  Symptoms: none  Follow-up CBG: Time:0732 CBG Result:91  Possible Reasons for Event: unknown  Comments/MD notified:    [facility] [place]  Adult (Non-Pregnant) Hypoglycemia Standing Orders  1.  RN shall initiate Hypoglycemia Standing Orders for emergency measures immediately when:            w Routine or STAT CBG and/or a lab glucose indicates hypoglycemia (CBG < 70 mg/dl)  2.  Treat the patient according to ability to take PO's and severity of hypoglycemia.   3.  If patient is on GlucoStabilizer, follow directions provided by the [facility] for hypoglycemic events.  4.  If patient on insulin pump, follow Hypoglycemia Standing Orders.  If patient requires more than one treatment have patient place pump in SUSPEND and notify MD.  DO NOT leave pump in SUSPEND for greater than 30 minutes unless ordered by MD.  A.  Treatment for Mild or Moderate-Patient cooperative and able to swallow    1.  Patient taking PO's and can cooperate   a.  Give one of the following 15 gram CHO options:                           w 1 tube oral dextrose gel                           w 3-4 Glucose tablets                           w 4 oz. Juice                           w 4 oz. regular soda                                    ESRD patients:  clear, regular soda                           w 8 oz. skim milk    b.  Recheck CBG in 15 minutes after treatment                            w If CBG < 70 mg/dl, repeat treatment and recheck until hypoglycemia is resolved                            w If CBG > 70 mg/dl and next meal is more than 1 hour away, give additional 15 grams CHO   2.  Patient NPO-Patient cooperative and no altered mental status    a.  Give 25 ml of D50 IV.   b.  Recheck CBG in 15 minutes after treatment.                             w If CBG is less than 70 mg/dl, repeat treatment and recheck until hypoglycemia is resolved.   c.   Notify MD for further orders.             SPECIAL CONSIDERATIONS:    a.  If no IV access,  w Start IV of D5W at [facility]                             w Give 25 ml of D50 IV.    b.  If unable to gain IV access                             w Give Glucagon IM:    i.  1 mg if patient weighs more than 45.5 kg    ii.  0.5 mg if patient weighs less than 45.5 kg   c.  Notify MD for further orders  B.  Treatment for Severe-- Patient unconscious or unable to take PO's safely    1.  Position patient on side   2.  Give 50 ml D50 IV   3.  Recheck CBG in 15 minutes.                    w If CBG is less than 70 mg/dl, repeat treatment and recheck until hypoglycemia is resolved.   4.  Notify MD for further orders.    SPECIAL CONSIDERATIONS:    a.  If no IV access                              w Give Glucagon IM                                        i.  1 mg if patient weighs more than 45.5 kg                                       ii.  0.5 mg if patient weighs less than 45.5 kg                              w Start IV of D5W at 50 ml/hr and give 50 ml D50 IV   b.  If no IV access and active seizure                               w Call Rapid Response   c.  If unable to gain IV access, give Glucagon IM:                              w 1 mg if patient weighs more than 45.5 kg                              w 0.5 mg if patient weighs less than 45.5 kg   d.  Notify MD for further orders.  C.  Complete smart text progress note to document intervention and follow-up CBG   1.  In [facility] patient chart, click on Notes (left side of screen)   2.  Create Progress Note   3.  Click on [employer].  In the Match box type "hypo" and enter    4.  Double click on [facility] IP HYPOGLYCEMIC EVENT and enter data   5.  MD must be notified if patient is NPO or experienced severe hypoglycemia

## 2015-12-27 NOTE — Progress Notes (Signed)
Social Work  Discharge Note  The overall goal for the admission was met for:   Discharge location: [place]  Length of Stay: Yes-7 DAYS  Discharge activity level: Yes-MOD/I LEVEL  Home/community participation: Yes  Services provided included: MD, RD, PT, OT, RN, CM, TR, Pharmacy and SW  Financial Services: Medicare and Medicaid  Follow-up services arranged: Home Health: [place] [facility]-PT,OT,RN, DME: [place] and Patient/Family has no preference for HH/DME agencies  Comments (or additional information):PT REACHED GOALS OF MOD/I FEELS READY TO [place]. SISTER TO TRANSPORT HOME, [facility] TO BRING PORTABLE O2 TO FOR RIDE HOME.  Patient/Family verbalized understanding of follow-up arrangements: Yes  Individual responsible for coordination of the follow-up plan: SELF & CHRISTOPHER-BOYFRIEND Confirmed correct DME delivered: Elease Hashimoto 12/27/2015    Elease Hashimoto

## 2015-12-27 NOTE — Progress Notes (Signed)
[facility] PHYSICAL MEDICINE & REHABILITATION     PROGRESS NOTE  Subjective/Complaints:  Pt slept well overnight.  She is ready to go home today.    ROS:  Denies CP, SOB, n/v/d.  Objective: Vital Signs: Blood pressure 126/59, pulse 60, temperature 98.3 F (36.8 C), temperature source Oral, resp. rate 18, height 5\' 5"  (1.651 m), weight 74 kg (163 lb 2.3 oz), SpO2 100 %. No results found. No results for input(s): WBC, HGB, HCT, PLT in the last 72 hours.  Recent Labs  12/26/15 1357 12/27/15 0431  NA 141  --   K 4.0  --   CL 100*  --   GLUCOSE 164*  --   BUN 12  --   CREATININE 0.61 0.52  CALCIUM 9.1  --    CBG (last 3)   Recent Labs  12/26/15 2114 12/27/15 0655 12/27/15 0731  GLUCAP 121* 66 91    Wt Readings from Last 3 Encounters:  12/27/15 74 kg (163 lb 2.3 oz)  12/20/15 77.61 kg (171 lb 1.6 oz)  12/17/15 78.5 kg (173 lb 1 oz)    Physical Exam:  BP 126/59 mmHg  Pulse 60  Temp(Src) 98.3 F (36.8 C) (Oral)  Resp 18  Ht 5\' 5"  (1.651 m)  Wt 74 kg (163 lb 2.3 oz)  BMI 27.15 kg/m2  SpO2 100% Constitutional: She appears well-developed and well-nourished. NAD. Vital signs reviewed.  HENT: Normocephalic and atraumatic.  Mouth/Throat: Oropharynx is clear and moist. Dental caries present.  Eyes: Conjunctivae and EOM are normal. Neck: Normal range of motion. Neck supple.  Cardiovascular: Normal rate and regular rhythm.  Respiratory: Effort normal. No accessory muscle usage. No respiratory distress. Normal breath sounds.   GI: Soft. Bowel sounds are normal. She exhibits no distension. There is no tenderness.  Musculoskeletal: She exhibits no edema or tenderness.  Bilateral hands with evidence of muscle wasting.  Neurological: She is alert and oriented.  Speech clear and able to follow commands without difficulty.  Motor: B/L UE: Shoulder abduction 4+/5, elbow flexion/extension 4/5, hand grip 4+/5 B/l LE: Hip flexion 3-/5, distally 5/5 Skin: Skin is warm and  dry.  Psychiatric: She has a normal mood and affect. Her speech is normal and behavior is normal. Thought content normal. Her mood appears not anxious.   Assessment/Plan: 1. 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 Physiatrist is providing close team supervision and 24 hour management of active medical problems listed below. Physiatrist and rehab team continue to assess barriers to discharge/monitor patient progress toward functional and medical goals.  Function:  Bathing Bathing position   Position: Sitting EOB  Bathing parts Body parts bathed by patient: Right arm, Left arm, Chest, Abdomen, Front perineal area, Right upper leg, Left upper leg, Buttocks, Right lower leg, Left lower leg, Back Body parts bathed by helper: Back  Bathing assist Assist Level: More than reasonable time      Upper Body Dressing/Undressing Upper body dressing   What is the patient wearing?: Pull over shirt/dress     Pull over shirt/dress - Perfomed by patient: Thread/unthread right sleeve, Thread/unthread left sleeve, Put head through opening, Pull shirt over trunk          Upper body assist Assist Level: More than reasonable time   Set up : To obtain clothing/put away  Lower Body Dressing/Undressing Lower body dressing   What is the patient wearing?: Underwear,  Pants, Non-skid slipper socks Underwear - Performed by patient: Thread/unthread right underwear leg, Thread/unthread left underwear leg, Pull underwear up/down   Pants- Performed by patient: Thread/unthread right pants leg, Thread/unthread left pants leg, Pull pants up/down, Fasten/unfasten pants   Non-skid slipper socks- Performed by patient: Don/doff right sock, Don/doff left sock                    Lower body assist Assist for lower body dressing: More than reasonable time       Toileting Toileting   Toileting steps completed by patient: Adjust clothing prior to toileting, Performs perineal hygiene, Adjust clothing after toileting      Toileting assist Assist level: Supervision or verbal cues (per [employer], NT)   Transfers Chair/bed transfer   Chair/bed transfer method: Ambulatory Chair/bed transfer assist level: Supervision or verbal cues Chair/bed transfer assistive device: Patent attorney     Max distance: 281ft Assist level: Supervision or verbal cues   Wheelchair   Type: Manual Max wheelchair distance: 326ft Assist Level: Supervision or verbal cues  Cognition Comprehension Comprehension assist level: Follows complex conversation/direction with no assist  Expression Expression assist level: Expresses complex ideas: With no assist  Social Interaction Social Interaction assist level: Interacts appropriately with others - No medications needed.  Problem Solving Problem solving assist level: Solves complex problems: Recognizes & self-corrects  Memory Memory assist level: Complete Independence: No helper    Medical Problem List and Plan: 1. 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 DVT Prophylaxis/Anticoagulation: Pharmaceutical: Lovenox 3. Pain Management: Cont meds PRN 4. Mood: Team to provide ego support. LCSW to follow for evaluation and support.  5. Neuropsych: This patient is capable of making decisions on her own behalf. 6. Skin/Wound Care: Routine pressure relief measures. Maintain adequate nutrition and hydration.  7. Fluids/Electrolytes/Nutrition: Monitor strict I/O. Check weights daily. 8. Pyelonephritis: to continue Keflex thorough 3/9 to complete antibiotic course.   Recheck Ucs with insignificant growth on 3/5 9. 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  10 Acute on chronic systolic CHF: Compensated--check  daily weights. Continue lasix 20 mg/day. 11. Lupus: On Cellcept and stress dose steroids at this time.  12. DMT2:    Will d/c lantus 20U due to low CBGs and concern for hypoglycemia at home.  Will cont metformin. With close outpt follow up  Monitor BS with ac/hs checks and use SSI for now.   Anticipate BS to to be elevated with steroids.   Dietician to educate patient on CM diet.   CBG 66 this am 13. Bipolar disorder with panic attacks: Was on Abilify years ago and most recently has used service dog to help manage symptoms.  14. Anemia of chronic disease?:    Hb 8.3 on 3/6 15.  Constipation adjust laxatives: Resolved  Increased meds on 3/9  Pt had BM yesterday  LOS (Days) 7 A FACE TO FACE EVALUATION WAS PERFORMED  Ankit Karis Juba 12/27/2015 8:57 AM

## 2015-12-27 NOTE — Discharge Instructions (Signed)
Inpatient Rehab Discharge Instructions  Makayla Vasquez Discharge date and time: 12/27/15   Activities/Precautions/ Functional Status: Activity: activity as tolerated Diet: diabetic diet Low salt Wound Care: none needed   Functional status:  ___ No restrictions     ___ Walk up steps independently ___ 24/7 supervision/assistance   ___ Walk up steps with assistance _X__ Intermittent supervision/assistance  ___ Bathe/dress independently ___ Walk with walker     ___ Bathe/dress with assistance ___ Walk Independently    ___ Shower independently ___ Walk with assistance    ___ Shower with assistance _X__ No alcohol     ___ Return to work/school ________   Special Instructions: 1. Check blood sugars before meals and at bedtime. Do not use insulin for now. Call MD if your blood sugars start running over 150. 2. Follow prednisone taper as instructed. 3. Check weights daily   COMMUNITY REFERRALS UPON DISCHARGE:    Home Health:   PT, OT ,RN  Agency:[facility] Phone:[phone]   Date of last service:12/27/2015  Medical Equipment/Items Ordered:WHEELCHAIR  Agency/Supplier:[facility]   [phone] Other:JERMAINE TO BRING HER PORTABLE O2 TANK TO TAKE HOME   My questions have been answered and I understand these instructions. I will adhere to these goals and the provided educational materials after my discharge from the hospital.  Patient/Caregiver Signature _______________________________ Date __________  Clinician Signature _______________________________________ Date __________  Please bring this form and your medication list with you to all your follow-up doctor's appointments.

## 2015-12-28 ENCOUNTER — Encounter: Payer: Self-pay | Admitting: Cardiology

## 2015-12-29 DIAGNOSIS — K219 Gastro-esophageal reflux disease without esophagitis: Secondary | ICD-10-CM | POA: Diagnosis not present

## 2015-12-29 DIAGNOSIS — L930 Discoid lupus erythematosus: Secondary | ICD-10-CM | POA: Diagnosis not present

## 2015-12-29 DIAGNOSIS — Z950 Presence of cardiac pacemaker: Secondary | ICD-10-CM | POA: Diagnosis not present

## 2015-12-29 DIAGNOSIS — Z87891 Personal history of nicotine dependence: Secondary | ICD-10-CM | POA: Diagnosis not present

## 2015-12-29 DIAGNOSIS — F419 Anxiety disorder, unspecified: Secondary | ICD-10-CM | POA: Diagnosis not present

## 2015-12-29 DIAGNOSIS — E1142 Type 2 diabetes mellitus with diabetic polyneuropathy: Secondary | ICD-10-CM | POA: Diagnosis not present

## 2015-12-29 DIAGNOSIS — F319 Bipolar disorder, unspecified: Secondary | ICD-10-CM | POA: Diagnosis not present

## 2015-12-29 DIAGNOSIS — I5023 Acute on chronic systolic (congestive) heart failure: Secondary | ICD-10-CM | POA: Diagnosis not present

## 2015-12-29 DIAGNOSIS — M069 Rheumatoid arthritis, unspecified: Secondary | ICD-10-CM | POA: Diagnosis not present

## 2015-12-29 DIAGNOSIS — I10 Essential (primary) hypertension: Secondary | ICD-10-CM | POA: Diagnosis not present

## 2015-12-29 DIAGNOSIS — Z8701 Personal history of pneumonia (recurrent): Secondary | ICD-10-CM | POA: Diagnosis not present

## 2015-12-29 DIAGNOSIS — Z87440 Personal history of urinary (tract) infections: Secondary | ICD-10-CM | POA: Diagnosis not present

## 2015-12-29 DIAGNOSIS — E669 Obesity, unspecified: Secondary | ICD-10-CM | POA: Diagnosis not present

## 2015-12-31 ENCOUNTER — Telehealth: Payer: Self-pay | Admitting: *Deleted

## 2015-12-31 ENCOUNTER — Telehealth: Payer: Self-pay | Admitting: Internal Medicine

## 2015-12-31 ENCOUNTER — Telehealth: Payer: Self-pay

## 2015-12-31 DIAGNOSIS — K219 Gastro-esophageal reflux disease without esophagitis: Secondary | ICD-10-CM | POA: Diagnosis not present

## 2015-12-31 DIAGNOSIS — M069 Rheumatoid arthritis, unspecified: Secondary | ICD-10-CM | POA: Diagnosis not present

## 2015-12-31 DIAGNOSIS — L930 Discoid lupus erythematosus: Secondary | ICD-10-CM | POA: Diagnosis not present

## 2015-12-31 DIAGNOSIS — E1142 Type 2 diabetes mellitus with diabetic polyneuropathy: Secondary | ICD-10-CM | POA: Diagnosis not present

## 2015-12-31 DIAGNOSIS — I5023 Acute on chronic systolic (congestive) heart failure: Secondary | ICD-10-CM | POA: Diagnosis not present

## 2015-12-31 DIAGNOSIS — I10 Essential (primary) hypertension: Secondary | ICD-10-CM | POA: Diagnosis not present

## 2015-12-31 NOTE — Telephone Encounter (Signed)
Received call from Gwynn Burly (RN for [facility]).  She wanted to relay to the MD that pt is c/o nosebleeds and wanted to let Dr. Althea Charon know that her BP was 92/64 today.  She is also asking for verbal orders to add humidity to pt oxygen tank.  You can return her call to [phone]. Maisa Bedingfield, Maryjo Rochester, CMA

## 2015-12-31 NOTE — Telephone Encounter (Signed)
Ok to see NP 

## 2015-12-31 NOTE — Telephone Encounter (Signed)
Patient calling to get HFU appointment with Dr. Maple Hudson. Advised patient that I will speak with Florentina Addison to find out where she can be placed on the schedule and will call her back with appointment. Katie - please advise.

## 2015-12-31 NOTE — Telephone Encounter (Signed)
Called spoke with pt. HFU scheduled 3/20 at 11. Nothing further needed

## 2015-12-31 NOTE — Telephone Encounter (Signed)
1. Are you/is patient experiencing any problems since coming home? Are there any questions regarding any aspect of care? No.  2. Are there any questions regarding medications administration/dosing? Are meds being taken as prescribed? Patient should review meds with caller to confirm. No questions on medications. Medications have been verified with patient.  3. Have there been any falls? No  4. Has Home Health been to the house and/or have they contacted you? If not, have you tried to contact them? Can we help you contact them?  Nursing has made a visit. Helping patient with her high blood sugar readings. They are aware her BS is 311. 5. Are bowels and bladder emptying properly? Are there any unexpected incontinence issues? If applicable, is patient following bowel/bladder programs? Denies any issues. 6. Any fevers, problems with breathing, unexpected pain? No 7. Are there any skin problems or new areas of breakdown? No 8. Has the patient/family member arranged specialty MD follow up (ie cardiology/neurology/renal/surgical/etc)?  Can we help arrange? Patient has made follow up appointments with appropriate doctors.  9. Does the patient need any other services or support that we can help arrange? No 10. Are caregivers following through as expected in assisting the patient? Yes 11. Has the patient quit smoking, drinking alcohol, or using drugs as recommended? Yes  Pt is aware of her appointment on 01/09/16 at 11:20 am with Dr. Allena Katz.

## 2015-12-31 NOTE — Telephone Encounter (Signed)
[name] (RN at [facility]) back. I reviewed the BP and am okay with it. I agree about the nosebleeds likely related to supplemental oxygen. Provided verbal order authorization to add humidity to oxygen tank. No further actions needed at this time.  Saralyn Pilar, DO [facility], PGY-3

## 2016-01-01 DIAGNOSIS — E1142 Type 2 diabetes mellitus with diabetic polyneuropathy: Secondary | ICD-10-CM | POA: Diagnosis not present

## 2016-01-01 DIAGNOSIS — M069 Rheumatoid arthritis, unspecified: Secondary | ICD-10-CM | POA: Diagnosis not present

## 2016-01-01 DIAGNOSIS — K219 Gastro-esophageal reflux disease without esophagitis: Secondary | ICD-10-CM | POA: Diagnosis not present

## 2016-01-01 DIAGNOSIS — I5023 Acute on chronic systolic (congestive) heart failure: Secondary | ICD-10-CM | POA: Diagnosis not present

## 2016-01-01 DIAGNOSIS — L930 Discoid lupus erythematosus: Secondary | ICD-10-CM | POA: Diagnosis not present

## 2016-01-01 DIAGNOSIS — I10 Essential (primary) hypertension: Secondary | ICD-10-CM | POA: Diagnosis not present

## 2016-01-02 DIAGNOSIS — I5023 Acute on chronic systolic (congestive) heart failure: Secondary | ICD-10-CM | POA: Diagnosis not present

## 2016-01-02 DIAGNOSIS — I10 Essential (primary) hypertension: Secondary | ICD-10-CM | POA: Diagnosis not present

## 2016-01-02 DIAGNOSIS — M069 Rheumatoid arthritis, unspecified: Secondary | ICD-10-CM | POA: Diagnosis not present

## 2016-01-02 DIAGNOSIS — K219 Gastro-esophageal reflux disease without esophagitis: Secondary | ICD-10-CM | POA: Diagnosis not present

## 2016-01-02 DIAGNOSIS — E1142 Type 2 diabetes mellitus with diabetic polyneuropathy: Secondary | ICD-10-CM | POA: Diagnosis not present

## 2016-01-02 DIAGNOSIS — L930 Discoid lupus erythematosus: Secondary | ICD-10-CM | POA: Diagnosis not present

## 2016-01-03 DIAGNOSIS — M069 Rheumatoid arthritis, unspecified: Secondary | ICD-10-CM | POA: Diagnosis not present

## 2016-01-03 DIAGNOSIS — K219 Gastro-esophageal reflux disease without esophagitis: Secondary | ICD-10-CM | POA: Diagnosis not present

## 2016-01-03 DIAGNOSIS — E1142 Type 2 diabetes mellitus with diabetic polyneuropathy: Secondary | ICD-10-CM | POA: Diagnosis not present

## 2016-01-03 DIAGNOSIS — I5023 Acute on chronic systolic (congestive) heart failure: Secondary | ICD-10-CM | POA: Diagnosis not present

## 2016-01-03 DIAGNOSIS — I10 Essential (primary) hypertension: Secondary | ICD-10-CM | POA: Diagnosis not present

## 2016-01-03 DIAGNOSIS — L930 Discoid lupus erythematosus: Secondary | ICD-10-CM | POA: Diagnosis not present

## 2016-01-04 ENCOUNTER — Telehealth: Payer: Self-pay | Admitting: Family Medicine

## 2016-01-04 ENCOUNTER — Ambulatory Visit (INDEPENDENT_AMBULATORY_CARE_PROVIDER_SITE_OTHER): Payer: Medicare Other | Admitting: *Deleted

## 2016-01-04 ENCOUNTER — Telehealth (HOSPITAL_COMMUNITY): Payer: Self-pay | Admitting: Vascular Surgery

## 2016-01-04 DIAGNOSIS — I5023 Acute on chronic systolic (congestive) heart failure: Secondary | ICD-10-CM | POA: Diagnosis not present

## 2016-01-04 DIAGNOSIS — K219 Gastro-esophageal reflux disease without esophagitis: Secondary | ICD-10-CM | POA: Diagnosis not present

## 2016-01-04 DIAGNOSIS — I442 Atrioventricular block, complete: Secondary | ICD-10-CM

## 2016-01-04 DIAGNOSIS — E1142 Type 2 diabetes mellitus with diabetic polyneuropathy: Secondary | ICD-10-CM | POA: Diagnosis not present

## 2016-01-04 DIAGNOSIS — L930 Discoid lupus erythematosus: Secondary | ICD-10-CM | POA: Diagnosis not present

## 2016-01-04 DIAGNOSIS — I10 Essential (primary) hypertension: Secondary | ICD-10-CM | POA: Diagnosis not present

## 2016-01-04 DIAGNOSIS — M069 Rheumatoid arthritis, unspecified: Secondary | ICD-10-CM | POA: Diagnosis not present

## 2016-01-04 NOTE — Telephone Encounter (Signed)
Form placed in PCP box 

## 2016-01-04 NOTE — Telephone Encounter (Signed)
Called to verify if patient should still not take potassium Reviewed discharge summary and patient was instructed to stop potassium This can be discussed further at patients follow up on  3/21

## 2016-01-04 NOTE — Telephone Encounter (Signed)
Forms dropped off to be filled out. Please call her when completed.

## 2016-01-04 NOTE — Telephone Encounter (Signed)
Nurse form [facility] called needing to speak to clinical staff to f/u on  pt potassium instructions please advise

## 2016-01-07 ENCOUNTER — Ambulatory Visit (INDEPENDENT_AMBULATORY_CARE_PROVIDER_SITE_OTHER): Payer: Medicare Other | Admitting: Family Medicine

## 2016-01-07 ENCOUNTER — Encounter: Payer: Self-pay | Admitting: Pulmonary Disease

## 2016-01-07 ENCOUNTER — Encounter: Payer: Self-pay | Admitting: Family Medicine

## 2016-01-07 ENCOUNTER — Ambulatory Visit (INDEPENDENT_AMBULATORY_CARE_PROVIDER_SITE_OTHER): Payer: Medicare Other | Admitting: Pulmonary Disease

## 2016-01-07 VITALS — BP 112/78 | HR 121 | Ht 65.0 in | Wt 153.0 lb

## 2016-01-07 VITALS — BP 117/76 | HR 124 | Temp 98.1°F | Ht 65.0 in | Wt 153.0 lb

## 2016-01-07 DIAGNOSIS — G720 Drug-induced myopathy: Secondary | ICD-10-CM

## 2016-01-07 DIAGNOSIS — Z794 Long term (current) use of insulin: Secondary | ICD-10-CM

## 2016-01-07 DIAGNOSIS — J849 Interstitial pulmonary disease, unspecified: Secondary | ICD-10-CM | POA: Diagnosis not present

## 2016-01-07 DIAGNOSIS — J309 Allergic rhinitis, unspecified: Secondary | ICD-10-CM | POA: Diagnosis not present

## 2016-01-07 DIAGNOSIS — I10 Essential (primary) hypertension: Secondary | ICD-10-CM

## 2016-01-07 DIAGNOSIS — J9611 Chronic respiratory failure with hypoxia: Secondary | ICD-10-CM | POA: Diagnosis not present

## 2016-01-07 DIAGNOSIS — E118 Type 2 diabetes mellitus with unspecified complications: Secondary | ICD-10-CM

## 2016-01-07 DIAGNOSIS — T380X5A Adverse effect of glucocorticoids and synthetic analogues, initial encounter: Principal | ICD-10-CM

## 2016-01-07 DIAGNOSIS — G722 Myopathy due to other toxic agents: Secondary | ICD-10-CM

## 2016-01-07 DIAGNOSIS — J189 Pneumonia, unspecified organism: Secondary | ICD-10-CM | POA: Diagnosis not present

## 2016-01-07 MED ORDER — FLUTICASONE PROPIONATE 50 MCG/ACT NA SUSP: 2.0000 | Freq: Every day | NASAL | Status: DC

## 2016-01-07 MED ORDER — BUDESONIDE-FORMOTEROL FUMARATE 80-4.5 MCG/ACT IN AERO: 2.0000 | INHALATION_SPRAY | Freq: Two times a day (BID) | RESPIRATORY_TRACT | Status: DC | PRN

## 2016-01-07 MED ORDER — LORATADINE 10 MG PO TABS: 10.0000 mg | ORAL_TABLET | Freq: Every day | ORAL | Status: DC

## 2016-01-07 NOTE — Progress Notes (Signed)
Subjective:    Patient ID: Makayla Vasquez, female    DOB: 08/13/1981, 35 y.o.   MRN: [id number]  Makayla Vasquez is a 35 y.o. female presenting on 01/07/2016 for Hospitalization Follow-up and Follow-up  HPI   STEROID MYOPATHY / GENERALIZED WEAKNESS, deconditioning: Hospitalized from 12/18/15 through 12/20/15 for re-admission after treated for pyelonephritis, with worsening bilateral LE weakness and dyspnea. Imaging did not identify etiology, exam was consistent with proximal muscle weakness, no focal neurological deficits. Diagnosed with steroid myopathy given chronic steroid use for chronic lung disease and possible lupus. Admitted to CIR for continued PT rehab. - Today doing better. Uses wheelchair around house or for prolonged activity. Dependent on assisted device, uses walker when not in wheelchair. Continues to receive HH PT x 12 weeks - Request handicap placard and housing switch to handicap unit, forms completed today - Admits significant weight loss (down 20 lbs in 1 month, 30 lbs in 3 months) and muscle atrophy lower extremity and generalized - Denies any fall, myalgias, joint pain redness or effusion, rash  Chronic Interstitial Lung Disease chronic resp failure / Unlikely HCAP Known ILD on 2-4L O2 at home, followed by Pulmonology. On admission CXR and symptoms concerning for possible pulmonary edema vs multi-lobar PNA, due to recent hospitalization within prior week and IV antibiotics, she was at risk for CHAP. Treated with Vanc and Cefepime, however after 24-48 hr treatment, with afebrile and resolved O2 req and symptoms, low likelihood of HCAP, antibiotics were discontinued. Pulmonology started increased steroid taper, with slow taper back to 10mg  daily maintenance. Future consideration of lung biopsy once back at baseline. - Today states taking Prednisone 20mg  twice daily, but review of rx should switch to 10mg  BID x 3-4 days then 10mg  daily - Scheduled to see Pulmonology for hosp f/u  today - Has a home pulse ox, check every 30 min, if drop below < 90% wear portable O2 at 2L using PRN and every night. Has not needed it often. - Admits to sinus congestion - Denies fever, nasal purulence, productive cough, sore throat, sinus pain/pressure  HYPOTENSION / H/o CHF During hospital stay she was taken off Cozaar and Bisoprolol due to hypotension, off K supplement as well. Continues to hold these, now with stable low BP. She will follow-up with Cardiology 3/21.   DM2, chronic steroid use: Last A1c in hospital 6.5 down from as high as >15 in 01/2015 then 8.5 in 04/2015. She was tapered off of Lantus insulin in hospital, only on short acting to cover. She does not have glucometer readings with her today. Admits to elevated blood sugars while on higher dose prednisone, < 300, avg 200-250. - Denies hypoglycemia  Social History  Substance Use Topics  . Smoking status: Former Smoker -- 0.25 packs/day for .5 years    Types: Cigarettes    Quit date: 07/25/1996  . Smokeless tobacco: Never Used  . Alcohol Use: No    Review of Systems Per HPI unless specifically indicated above     Objective:    BP 117/76 mmHg  Pulse 124  Temp(Src) 98.1 F (36.7 C) (Oral)  Ht 5\' 5"  (1.651 m)  Wt 153 lb (69.4 kg)  BMI 25.46 kg/m2  Wt Readings from Last 3 Encounters:  01/07/16 153 lb (69.4 kg)  01/07/16 153 lb (69.4 kg)  12/27/15 163 lb 2.3 oz (74 kg)    Physical Exam  Constitutional: She is oriented to person, place, and time. She appears well-developed and well-nourished. No distress.  Chronically ill appearing, currently well, comfortable, cooperative.  HENT:  Head: Normocephalic and atraumatic.  Mouth/Throat: Oropharynx is clear and moist. No oropharyngeal exudate.  Not on nasal cannula O2. Sinuses non-tender, patent nares without significant congestion, oropharynx clear.  Eyes: Conjunctivae are normal. Right eye exhibits no discharge. Left eye exhibits no discharge.  Neck: Normal  range of motion. Neck supple. No thyromegaly present.  Cardiovascular: Regular rhythm, normal heart sounds and intact distal pulses.   No murmur heard. Tachycardic  Pulmonary/Chest: Effort normal and breath sounds normal. No respiratory distress. She has no wheezes. She has no rales.  Musculoskeletal: She exhibits no edema or tenderness.  Significant generalized muscle atrophy, especially bilateral lower extremities. Lower extremity muscle str symmetrical 4-5/5  Lymphadenopathy:    She has no cervical adenopathy.  Neurological: She is alert and oriented to person, place, and time.  Skin: Skin is warm and dry. No rash noted. She is not diaphoretic.  Nursing note and vitals reviewed.      Assessment & Plan:   Problem List Items Addressed This Visit    Chronic respiratory failure with hypoxia (HCC)    Stable, secondary to ILD. On home O2 PRN 2 L activity and at night. See A&P for ILD Followed by Pulm      RESOLVED: HCAP (healthcare-associated pneumonia)    Resolved. Unlikely HCAP, did have risk factors prior hospitalization. Clinically not consistent, antibiotics discontinued in hospital.      Hypertension    Off Cozaar and Bisoprolol in hospital due to hypotension. Holding currently with stable low BP in office. Hold K. Follow-up Cards 3/21      ILD (interstitial lung disease) (HCC)    Stable after hospitalization. Less likely HCAP, more likely ILD. On Home O2 PRN, checks pulse ox, using infrequently at rest, only activity and overnight. Follow-up with Pulm 3/20, continue prednisone taper back to 10mg  daily Future consider lung biopsy      Steroid myopathy - Primary    Diffuse muscle atrophy, current working diagnosis is steroid myopathy, prior diff dx critical illness myopathy. Also worsened with deconditioning and repeated hospitalizations, patient activity limited by ILD and O2 req. - Improved with CIR  Plan: 1. Continue HH PT x 12 weeks 2. Prednisone taper back to  10mg  daily      Type 2 diabetes mellitus with complication, with long-term current use of insulin (HCC)    A1c down to 6.5, likely with acute weight loss over past few months. Prior history of hypoglycemia, currently holding insulin. CBGs 200-250, lowering dose of pred taper now. Insulin titration as advised, call office and follow-up if persistent hyperglycemia >300 consistently.            No orders of the defined types were placed in this encounter.      Follow up plan: Return in about 3 months (around 04/08/2016) for diabetes.  , DO [facility], PGY-3

## 2016-01-07 NOTE — Patient Instructions (Addendum)
1.  Continue your prednisone as prescribed  2.  It is wonderful to hear you are doing better!   3.  We added flonase & claritin for allergy symptoms. This should help with nasal drainage.  Take as prescribed.   4. Continue PT efforts  5.  Refills for Symbicort were sent to your pharmacy  6.  Return to see Dr. Maple Hudson in 3 months for routine follow up or sooner if new needs arise.

## 2016-01-07 NOTE — Progress Notes (Signed)
Current Outpatient Prescriptions on File Prior to Visit  Medication Sig  . albuterol (PROVENTIL HFA;VENTOLIN HFA) 108 (90 BASE) MCG/ACT inhaler Inhale 2 puffs into the lungs every 4 (four) hours as needed for wheezing or shortness of breath.  [name] atorvastatin (LIPITOR) 40 MG tablet Take 1 tablet (40 mg total) by mouth daily.  . Blood Glucose Monitoring Suppl (ONE TOUCH ULTRA 2) w/Device KIT Check blood sugar before meals and at bedtim  . calcium-vitamin D (OSCAL WITH D) 500-200 MG-UNIT per tablet Take 1 tablet by mouth daily with breakfast.   . camphor-menthol (SARNA) lotion Apply topically 2 (two) times daily.  [name] etonogestrel (NEXPLANON) 68 MG IMPL implant 1 each by Subdermal route once.  . famotidine (PEPCID) 20 MG tablet Take 1 tablet (20 mg total) by mouth 2 (two) times daily.  . fluocinonide (LIDEX) 0.05 % external solution 1 APPLICATION APPLY ON THE SKIN AS DIRECTED APPLY TO AFFECTED AREAS DAILY ON SCALP AS NEEDED FOR ITCHING.  . furosemide (LASIX) 20 MG tablet Take 20 mg by mouth daily.  [name] glucose blood (ONE TOUCH ULTRA TEST) test strip Check blood sugar daily before breakfast  . metFORMIN (GLUCOPHAGE) 1000 MG tablet TAKE 1 TABLET BY MOUTH TWICE A DAY WITH A MEAL  . mycophenolate (CELLCEPT) 500 MG tablet Take 500 mg by mouth 2 (two) times daily.  . polyethylene glycol (MIRALAX / GLYCOLAX) packet Take 17 g by mouth daily after supper.  . predniSONE (DELTASONE) 20 MG tablet From 3/9-3/10: Take 30 mg in am and 20 mg in pm From 3/11-3/14: Take 20 mg in am and 20 mg in pm From 3/15- 3/18 Take 20 mg in am and 10 mg in pm From 3/19-3/22 Take 10 mg in am and 10 mg in pm From 3/22 on Take 10 mg daily  . senna-docusate (SENOKOT-S) 8.6-50 MG tablet Take 3 tablets by mouth 2 (two) times daily.   No current facility-administered medications on file prior to visit.     Chief Complaint  Patient presents with  . Hospitalization Follow-up    Pt states that breathing has been doing well since last  OV. No current SOB, cough, wheeze, congestion. Using 2L O2 PRN. Currently on 61m dose of Pred taper.      Tests None   Past medical hx  has a past medical history of Cardiac pacemaker; Congenital complete AV block; Obesity; GERD (gastroesophageal reflux disease); Asymptomatic LV dysfunction; Seizures ([place]; Anxiety; Bipolar affective disorder ([place]; Depression; Carpal tunnel syndrome of right wrist; Asthma; Arthritis; Hypertension; Pneumonitis; Sinus tachycardia ([place]; Diabetes mellitus without complication ([place]; Presence of permanent cardiac pacemaker; CHF (congestive heart failure) ([place]; Lupus ([place]; and Lupus (systemic lupus erythematosus) ([place].   Past surgical hx, Allergies, Family hx, Social hx all reviewed.  Vital Signs BP 112/78 mmHg  Pulse 121  Ht _0  (1.651 m)  Wt 153 lb (69.4 kg)  BMI 25.46 kg/m2  SpO2 93%  History of Present Illness Makayla NIELANDis a 35y.o. female with a PMH of PCOS, bipolar disorder, DM II, adrenal insufficiency, lupus and ILD and [facility] to [facility] (3/2-3/9) for steroid myopathy / wheelchair bound for ~ 2 months who presented to the office 3/20 for hospital follow up.    Currently, the patient denies SOB, fevers, chills, N/V/D, urinary symptoms.  She reports she only uses her oxygen at night and does not need it during the day.  She checks her own pulse  ox approx every 30 minutes (wears a pulse ox around her neck).  She has returned to her baseline 10 mg prednisone.  She completed antibiotics while inpatient.  Patient reports increased seasonal allergy symptoms - clear nasal drainage with occasional green tint and itchy eyes.  She has not walked for two months but doing better in rehab.   Physical Exam  General - well developed adult female in wheelchair, in no acute distress ENT - No sinus tenderness, no oral exudate, no LAN Cardiac - s1s2 regular, no murmur Chest -  even/non-labored, lungs bilaterally clear. No wheeze/rales Back - No focal tenderness Abd - Soft, non-tender Ext - No edema Neuro - Normal strength Skin - No rashes Psych - normal mood, and behavior   Assessment/Plan  1.  Seasonal Allergies       Chronic Hypoxic Respiratory Failure      ILD       Hx Acute Pneumonitis       Former Smoker        Plan: Nasal hygiene with OTC claritin, flonase Refill for symbicort  Follow up with Dr. Annamaria Boots in 3 months   2.  Resolved Pyelonephritis   3.  Steroid Myopathy - continue ongoing rehab efforts  4.  Lupus - returned to 10 mg baseline pred  5.  Tachycardia - note previously on bisoprolol, stopped at CIR.  Will defer to Cardiology.  Pt to see 3/21.    Patient Instructions  1.  Continue your prednisone as prescribed  2.  It is wonderful to hear you are doing better!   3.  We added flonase & claritin for allergy symptoms. This should help with nasal drainage.  Take as prescribed.   4. Continue PT efforts  5.  Refills for Symbicort were sent to your pharmacy  6.  Return to see Dr. Annamaria Boots in 3 months for routine follow up or sooner if new needs arise.        Noe Gens, NP-C [facility]  [phone] 01/07/2016, 5:30 PM

## 2016-01-07 NOTE — Patient Instructions (Signed)
Thank you for coming in to clinic today.  1. It sounds like you are doing well since leaving the hospital - Continue with home health physical therapy to work on improving your strength - Continue Prednisone taper, now switch to 10mg  (1 tablet) in morning and 10 mg 1 tab in evening for 4 days, then start just one tablet 10mg  daily everyday, discuss with [facility] today  2. Signed paperwork for Handicap placard, temporary for 6 months then need to renew it - Signed housing paper work for disability, for a handicap unit  Please schedule a follow-up appointment with Dr in 3 months for Diabetes follow-up  If you have any other questions or concerns, please feel free to call the clinic to contact me. You may also schedule an earlier appointment if necessary.  However, if your symptoms get significantly worse, please go to the Emergency Department to seek immediate medical attention.  , DO [facility]

## 2016-01-08 ENCOUNTER — Ambulatory Visit (HOSPITAL_COMMUNITY)
Admission: RE | Admit: 2016-01-08 | Discharge: 2016-01-08 | Disposition: A | Discharge status: Home / Self Care | Payer: Medicare Other | Source: Ambulatory Visit | Attending: Cardiology | Admitting: Cardiology

## 2016-01-08 ENCOUNTER — Encounter (HOSPITAL_COMMUNITY): Payer: Self-pay

## 2016-01-08 VITALS — BP 126/78 | HR 114 | Wt 157.0 lb

## 2016-01-08 DIAGNOSIS — E1142 Type 2 diabetes mellitus with diabetic polyneuropathy: Secondary | ICD-10-CM | POA: Diagnosis not present

## 2016-01-08 DIAGNOSIS — I110 Hypertensive heart disease with heart failure: Secondary | ICD-10-CM | POA: Insufficient documentation

## 2016-01-08 DIAGNOSIS — R000 Tachycardia, unspecified: Secondary | ICD-10-CM | POA: Diagnosis not present

## 2016-01-08 DIAGNOSIS — M069 Rheumatoid arthritis, unspecified: Secondary | ICD-10-CM | POA: Diagnosis not present

## 2016-01-08 DIAGNOSIS — I509 Heart failure, unspecified: Secondary | ICD-10-CM | POA: Diagnosis not present

## 2016-01-08 DIAGNOSIS — Z950 Presence of cardiac pacemaker: Secondary | ICD-10-CM | POA: Insufficient documentation

## 2016-01-08 DIAGNOSIS — I10 Essential (primary) hypertension: Secondary | ICD-10-CM | POA: Diagnosis not present

## 2016-01-08 DIAGNOSIS — I5023 Acute on chronic systolic (congestive) heart failure: Secondary | ICD-10-CM | POA: Diagnosis not present

## 2016-01-08 DIAGNOSIS — E119 Type 2 diabetes mellitus without complications: Secondary | ICD-10-CM | POA: Insufficient documentation

## 2016-01-08 DIAGNOSIS — I5022 Chronic systolic (congestive) heart failure: Secondary | ICD-10-CM | POA: Insufficient documentation

## 2016-01-08 DIAGNOSIS — L930 Discoid lupus erythematosus: Secondary | ICD-10-CM | POA: Diagnosis not present

## 2016-01-08 DIAGNOSIS — K219 Gastro-esophageal reflux disease without esophagitis: Secondary | ICD-10-CM | POA: Diagnosis not present

## 2016-01-08 DIAGNOSIS — I429 Cardiomyopathy, unspecified: Secondary | ICD-10-CM | POA: Insufficient documentation

## 2016-01-08 MED ORDER — LOSARTAN POTASSIUM 25 MG PO TABS: 12.5000 mg | ORAL_TABLET | Freq: Every day | ORAL | Status: DC

## 2016-01-08 MED ORDER — BISOPROLOL FUMARATE 5 MG PO TABS: 2.5000 mg | ORAL_TABLET | Freq: Every day | ORAL | Status: DC

## 2016-01-08 NOTE — Assessment & Plan Note (Signed)
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 Also worsened with deconditioning and repeated hospitalizations, patient activity limited by ILD and O2 req. - Improved with CIR  Plan: 1. Continue HH PT x 12 weeks 2. Prednisone taper back to 10mg  daily

## 2016-01-08 NOTE — Progress Notes (Addendum)
Patient ID: DRUSILLA WAMPOLE, female   DOB: Apr 22, 1981, 35 y.o.   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   She has a h/o congenital HB and underwent first PPM at age 35. In 2011 had ECHO with EF 35-40%. LV dysfunction felt to be due to RV pacing so upgraded to CRT-P. Recent echo in 10/15 with EF 25%. She has had worsening HF symptoms and referred for evaluation for advanced HF therapies. She also has concomitant lung disease and this has limited b-blocker dosing.  She follows with Dr. Annamaria Boots for her lung disease. She is a former smoker.  Admitted in 8/15 and 9/15 with worsening respiratory status and severe hypoxia. Had extensive w/u. Thought to have a viral pneumonitis complicated by atelectasis and mild edema vs. an autoimmune process. Discharged home on a course of levaquin and prednisone.   She was offered a lung biopsy for further diagnosis versus empiric treatment with steroids. She opted for empiric steroid treatment.  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  She is followed by a rheumatologist and is on Cellcept and prednisone taper. She is on oxygen and night and with activity.   Last echo in 1/17 showed EF 40-45%, mild LVH, diffuse hypokinesis.    She returns for follow up. Legs are weak and she came in in a wheelchair, thought to have steroid myopathy.  She denies orthopnea, PND, chest pain.  She does not get short of breath but not very active because of leg weakness.  Prednisone has been tapered down to 10 mg daily.    ECG (3/17) with NSR, BiV pacing  PFTs (12/15) showed a severe restrictive defect concerning for an interstitial process.   EP study 10/15 for persistent tachycardia and found to have sinus tachycardia.    06/16/14 CT scan showed bilateral progressive lower peribronchial infiltrates with groundglass changes and nodular patter consistent with acute pneumonitis of unclear etiology.  06/20/14 BAL showed 60% macrophages and 30% PMNs. Her AFB smear, fungal smear, legionella Ab, pneumocystis smear, ACE, and sputum culture are negative. Her AFB, legionella, fungal, and BAL culture as well as her fungal Ab, hypersensitivity pneumonitis panel were all negative Serology: DsDNA, RF, ANCA, HSP all negative  RHC 10/26/2014  RA = 2 RV = 27/1/5 PA = 29/7 (18) PCW = 4 Fick cardiac output/index = 4.5/2.2 PVR = 3.0 WU FA sat = 98% PA sat = 64%, 61%  Labs (10/15): TSH normal K 3.6, creatinine 1.1 Labs (10/31/2014) L k 3.9 Creatinine 1.06  Labs 07/23/2015: K 3.8 Creatinine 0.82  Labs (3/17): K 4, creatinine 0.6  Review of Systems: All systems reviewed and negative except as per HPI.   Past Medical History  Diagnosis Date  . Cardiac pacemaker     a. Since age 39 in 25. b. Upgrade to BiV in 2013.  . Congenital complete AV block   . Obesity   . GERD (gastroesophageal reflux disease)   . Asymptomatic LV dysfunction     a. Echo in Dec 2011 with EF 35 to 40%. Felt to be due to paced rhythm. b. EF 25-30% in 07/2014.  . Seizures ([place])     as a child- from high fever  . Anxiety   . Bipolar affective disorder ([place])   .  Depression     bipolar  . Carpal tunnel syndrome of right wrist   . Asthma     seasonal allergies   . Arthritis     rheumatoid arthritis- mild, no rheumatology care   . Hypertension   . Pneumonitis     a. a/w hypoxia - inflammatory - large workup 07/2014.  [name] Sinus tachycardia ([place])   . Diabetes mellitus without complication ([place])   . Presence of permanent cardiac pacemaker   . CHF (congestive heart failure) ([place])   . Lupus ([place])   . Lupus (systemic lupus erythematosus) ([place])     Current Outpatient Prescriptions  Medication Sig Dispense Refill  . albuterol (PROVENTIL HFA;VENTOLIN  HFA) 108 (90 BASE) MCG/ACT inhaler Inhale 2 puffs into the lungs every 4 (four) hours as needed for wheezing or shortness of breath. 1 each 1  . atorvastatin (LIPITOR) 40 MG tablet Take 1 tablet (40 mg total) by mouth daily. 90 tablet 3  . Blood Glucose Monitoring Suppl (ONE TOUCH ULTRA 2) w/Device KIT Check blood sugar before meals and at bedtim 1 each 0  . budesonide-formoterol (SYMBICORT) 80-4.5 MCG/ACT inhaler Inhale 2 puffs into the lungs 2 (two) times daily as needed (for shortness of breath). 1 Inhaler 6  . calcium-vitamin D (OSCAL WITH D) 500-200 MG-UNIT per tablet Take 1 tablet by mouth daily with breakfast.     . camphor-menthol (SARNA) lotion Apply topically 2 (two) times daily. 222 mL 0  . etonogestrel (NEXPLANON) 68 MG IMPL implant 1 each by Subdermal route once.    . famotidine (PEPCID) 20 MG tablet Take 1 tablet (20 mg total) by mouth 2 (two) times daily. 60 tablet 1  . fluocinonide (LIDEX) 0.05 % external solution 1 APPLICATION APPLY ON THE SKIN AS DIRECTED APPLY TO AFFECTED AREAS DAILY ON SCALP AS NEEDED FOR ITCHING.  5  . fluticasone (FLONASE) 50 MCG/ACT nasal spray Place 2 sprays into both nostrils daily. 16 g 6  . furosemide (LASIX) 20 MG tablet Take 20 mg by mouth daily.  5  . glucose blood (ONE TOUCH ULTRA TEST) test strip Check blood sugar daily before breakfast 100 each 0  . loratadine (CLARITIN) 10 MG tablet Take 1 tablet (10 mg total) by mouth daily. 30 tablet 11  . metFORMIN (GLUCOPHAGE) 1000 MG tablet TAKE 1 TABLET BY MOUTH TWICE A DAY WITH A MEAL 30 tablet 11  . mycophenolate (CELLCEPT) 500 MG tablet Take 500 mg by mouth 2 (two) times daily.    . polyethylene glycol (MIRALAX / GLYCOLAX) packet Take 17 g by mouth daily after supper. 14 each 0  . predniSONE (DELTASONE) 20 MG tablet From 3/9-3/10: Take 30 mg in am and 20 mg in pm From 3/11-3/14: Take 20 mg in am and 20 mg in pm From 3/15- 3/18 Take 20 mg in am and 10 mg in pm From 3/19-3/22 Take 10 mg in am and 10 mg in  pm From 3/22 on Take 10 mg daily 50 tablet 0  . senna-docusate (SENOKOT-S) 8.6-50 MG tablet Take 3 tablets by mouth 2 (two) times daily. 100 tablet 0  . bisoprolol (ZEBETA) 5 MG tablet Take 0.5 tablets (2.5 mg total) by mouth daily. 15 tablet 6  . losartan (COZAAR) 25 MG tablet Take 0.5 tablets (12.5 mg total) by mouth daily. 15 tablet 6   No current facility-administered medications for this encounter.    Allergies  Allergen Reactions  . Sertraline Hcl Hives  . Tape Other (See Comments)  Burns skin      Social History   Social History  . Marital Status: Single    Spouse Name: N/A  . Number of Children: 1  . Years of Education: N/A   Occupational History  . CNA    Social History Main Topics  . Smoking status: Former Smoker -- 0.25 packs/day for .5 years    Types: Cigarettes    Quit date: 07/25/1996  . Smokeless tobacco: Never Used  . Alcohol Use: No  . Drug Use: No  . Sexual Activity: Yes    Birth Control/ Protection: Implant, IUD     Comment: placed 04/2011   Other Topics Concern  . Not on file   Social History Narrative   Works at [employer].        Family History  Problem Relation Age of Onset  . Heart disease Mother     CHF (no details)  . Hypertension Mother   . Heart disease Father     Murmur  . Heart disease Sister 107     No details.  History of a pacemaker  . Lupus Mother     Danley Danker Vitals:   01/08/16 1356  BP: 126/78  Pulse: 114  Weight: 157 lb (71.215 kg)  SpO2: 96%    PHYSICAL EXAM: General:  NAD HEENT: normal Neck: supple. JVP ~6-7.  Carotids 2+ bilat; no bruits. No lymphadenopathy or thryomegaly appreciated. Cor: PMI nonpalpable. Regular. No rubs, gallops or murmurs. Lungs: CTAB Abdomen: obese soft, nontender, nondistended. No hepatosplenomegaly. No bruits or masses. Good bowel sounds. Extremities: no cyanosis, clubbing, rash, no edema Neuro: alert & oriented x 3, cranial nerves grossly intact. moves all 4 extremities w/o difficulty.  Affect pleasant.  ASSESSMENT & PLAN: 1) Chronic systolic HF:  Echo 05/3150 EF 20-25%, echo 1/17 improved with EF 40-45%. Nonischemic cardiomyopathy.  Medtronic CRT-P device.  Not volume overloaded on exam.  No shortness of breath but activity is limited by leg weakness. She was taken off bisoprolol and losartan at recent admission.  She is not sure why.  - She can continue Lasix 20 mg daily.  - Restart bisoprolol 2.5 qam and losartan 12.5 qpm.  Will need to watch BP as she has had a history of low BP.  BP today is ok. BMET in 10 days.  2) Possible SLE versus MCTD pneumonitis with interstitial lung disease: She is followed by rheumatology and is on Cellcept and prednisone.   3) Congenital AVN block s/p pacer: Has EP followup.  4) Steroid myopathy: She is very limited because of this.   5) Sinus tachycardia: As above, restarting bisoprolol.   Loralie Champagne 01/08/2016

## 2016-01-08 NOTE — Progress Notes (Signed)
Remote pacemaker transmission.   

## 2016-01-08 NOTE — Assessment & Plan Note (Signed)
Stable, secondary to ILD. On home O2 PRN 2 L activity and at night. See A&P for ILD Followed by Erline Hau

## 2016-01-08 NOTE — Assessment & Plan Note (Signed)
Off Cozaar and Bisoprolol in hospital due to hypotension. Holding currently with stable low BP in office. Hold K. Follow-up Cards 3/21

## 2016-01-08 NOTE — Assessment & Plan Note (Signed)
Resolved. Unlikely HCAP, did have risk factors prior hospitalization. Clinically not consistent, antibiotics discontinued in hospital.

## 2016-01-08 NOTE — Assessment & Plan Note (Signed)
A1c down to 6.5, likely with acute weight loss over past few months. Prior history of hypoglycemia, currently holding insulin. CBGs 200-250, lowering dose of pred taper now. Insulin titration as advised, call office and follow-up if persistent hyperglycemia >300 consistently.

## 2016-01-08 NOTE — Patient Instructions (Signed)
START Bisoprolol 2.5 mg (1/2 tablet) once every morning.  START Losartan 12.5 mg (1/2 tablet) once every evening.  Routine lab work today. Will notify you of abnormal results, otherwise no news is good news!  Follow up in 1 month with Dr. Shirlee Latch.  Do the following things EVERYDAY: 1) Weigh yourself in the morning before breakfast. Write it down and keep it in a log. 2) Take your medicines as prescribed 3) Eat low salt foods-Limit salt (sodium) to 2000 mg per day.  4) Stay as active as you can everyday 5) Limit all fluids for the day to less than 2 liters

## 2016-01-08 NOTE — Assessment & Plan Note (Addendum)
Stable after hospitalization. Less likely HCAP, more likely ILD. On Home O2 PRN, checks pulse ox, using infrequently at rest, only activity and overnight. Follow-up with Pulm 3/20, continue prednisone taper back to 10mg  daily Future consider lung biopsy

## 2016-01-08 NOTE — Telephone Encounter (Signed)
I saw patient for office visit on 01/07/16. Reviewed forms with her at that time, these included a Handicap Placard (authorized temporary 6 months due to unable to walk 200 ft without resting, requires assistance of device wheelchair/walker, uses supplemental oxygen) anticipate she could receive a permanent placard in future. Also completed Handicap Housing change request Disability Verification form, authorized that patient is considered disabled based on criteria, she requests change apartments to handicap accessible, this is reasonable. Both forms completed, signed and dated 01/07/16 and original copies returned directly to patient at end of office visit.  Saralyn Pilar, DO [facility], PGY-3

## 2016-01-09 ENCOUNTER — Encounter: Payer: Medicare Other | Attending: Physical Medicine & Rehabilitation | Admitting: Physical Medicine & Rehabilitation

## 2016-01-09 ENCOUNTER — Encounter: Payer: Self-pay | Admitting: Physical Medicine & Rehabilitation

## 2016-01-09 VITALS — BP 115/73 | HR 102 | Resp 14

## 2016-01-09 DIAGNOSIS — I5022 Chronic systolic (congestive) heart failure: Secondary | ICD-10-CM | POA: Insufficient documentation

## 2016-01-09 DIAGNOSIS — J9691 Respiratory failure, unspecified with hypoxia: Secondary | ICD-10-CM | POA: Diagnosis not present

## 2016-01-09 DIAGNOSIS — G722 Myopathy due to other toxic agents: Secondary | ICD-10-CM

## 2016-01-09 DIAGNOSIS — I110 Hypertensive heart disease with heart failure: Secondary | ICD-10-CM | POA: Insufficient documentation

## 2016-01-09 DIAGNOSIS — A419 Sepsis, unspecified organism: Secondary | ICD-10-CM | POA: Diagnosis not present

## 2016-01-09 DIAGNOSIS — R000 Tachycardia, unspecified: Secondary | ICD-10-CM | POA: Diagnosis not present

## 2016-01-09 DIAGNOSIS — J849 Interstitial pulmonary disease, unspecified: Secondary | ICD-10-CM | POA: Diagnosis not present

## 2016-01-09 DIAGNOSIS — I10 Essential (primary) hypertension: Secondary | ICD-10-CM

## 2016-01-09 DIAGNOSIS — R269 Unspecified abnormalities of gait and mobility: Secondary | ICD-10-CM | POA: Diagnosis not present

## 2016-01-09 DIAGNOSIS — Z09 Encounter for follow-up examination after completed treatment for conditions other than malignant neoplasm: Secondary | ICD-10-CM | POA: Diagnosis not present

## 2016-01-09 DIAGNOSIS — T380X5A Adverse effect of glucocorticoids and synthetic analogues, initial encounter: Secondary | ICD-10-CM

## 2016-01-09 DIAGNOSIS — G720 Drug-induced myopathy: Secondary | ICD-10-CM | POA: Diagnosis not present

## 2016-01-09 DIAGNOSIS — Z794 Long term (current) use of insulin: Secondary | ICD-10-CM

## 2016-01-09 DIAGNOSIS — E1142 Type 2 diabetes mellitus with diabetic polyneuropathy: Secondary | ICD-10-CM | POA: Diagnosis not present

## 2016-01-09 DIAGNOSIS — M329 Systemic lupus erythematosus, unspecified: Secondary | ICD-10-CM | POA: Insufficient documentation

## 2016-01-09 DIAGNOSIS — E118 Type 2 diabetes mellitus with unspecified complications: Secondary | ICD-10-CM

## 2016-01-09 NOTE — Progress Notes (Signed)
Subjective:    Patient ID: Makayla Vasquez, female    DOB: 31-Oct-1980, 35 y.o.   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   Admit date: 12/20/2015 Discharge date: 12/27/2015 She was  Encouraged to Check blood sugars before meals and at bedtime and to not use insulin for the time being.  She was instructed to follow her prednisone taper as instructed. She states her CBGs have ranged from 126-300.  She saw her PCP, who is following the pt.  She is currently on 10mg  of prednisone.  She was told to cont 10mg  by her Rheumatologist, who she is scheduled to see next month.  Her constipation has resolved.   DME: Shower chair Mobility: Wheelchair for long distances, rolling walker for short distances. Therapies: PT/OT (once daily)  Pain Inventory Average Pain 0 Pain Right Now 0 My pain is no pain  In the last 24 hours, has pain interfered with the following? General activity 0 Relation with others 0 Enjoyment of life 0 What TIME of day is your pain at its worst? no pain Sleep (in general) Good  Pain is worse with: no pain Pain improves with: no pain Relief from Meds: no pain  Mobility walk with assistance use a cane use a walker use a wheelchair  Function not employed: date last employed .  Neuro/Psych bladder control problems anxiety  Prior Studies hospital f/u  Physicians involved in your care hospital f/u   Family History  Problem Relation Age of Onset  . Heart disease Mother     CHF (no details)  . Hypertension Mother   . Heart disease Father     Murmur  . Heart disease Sister 19     No details.  History of a pacemaker  . Lupus Mother    Social History   Social History  .  Marital Status: Single    Spouse Name: N/A  . Number of Children: 1  . Years of Education: N/A   Occupational History  . CNA    Social History Main Topics  . Smoking status: Former Smoker -- 0.25 packs/day for .5 years    Types: Cigarettes    Quit date: 07/25/1996  . Smokeless tobacco: Never Used  . Alcohol Use: No  . Drug Use: No  . Sexual Activity: Yes    Birth Control/ Protection: Implant, IUD     Comment: placed 04/2011   Other Topics Concern  . None   Social History Narrative   Works at [employer].     Past Surgical History  Procedure Laterality Date  . Throat surgery  1994    s/p laser treatment  . Cesarean section    . Insert / replace / remove pacemaker      2001  . Cholecystectomy    . Iud removal  11/03/2011    Procedure: INTRAUTERINE DEVICE (IUD) REMOVAL;  Surgeon: Myra C. Marice Potter, MD;  Location: [facility]S;  Service: Gynecology;  Laterality: N/A;  . Cyst excision  12/10/2012    THYROID  . Thyroglossal duct cyst N/A 12/10/2012    Procedure: REVISION OF THYROGLOSSAL DUCT CYST EXCISION;  Surgeon: Serena Colonel, MD;  [facility];  Service: ENT;  Laterality: N/A;  Revision of Thyroglossal Duct Cyst  Excision  . Nasal fracture surgery      /w plate   . Carpal tunnel with cubital tunnel Right 07/26/2013    Procedure: RIGHT LIMITED OPEN CARPAL TUNNEL RELEASE ,  RIGHT CUBITAL TUNNEL RELEASE, INSITU VERSES ULNAR NERVE DECOMPRESSION AND ANTERIOR TRANSPOSITION;  Surgeon: Dominica Severin, MD;  Location: [facility] OR;  Service: Orthopedics;  Laterality: Right;  . Video bronchoscopy Bilateral 06/19/2014    Procedure: VIDEO BRONCHOSCOPY WITHOUT FLUORO;  Surgeon: Kalman Shan, MD;  Location: [facility] ENDOSCOPY;  Service: Cardiopulmonary;  Laterality: Bilateral;  . Bi-ventricular pacemaker upgrade N/A 03/08/2012    Procedure: BI-VENTRICULAR PACEMAKER UPGRADE;  Surgeon: Marinus Maw, MD;  Location: [facility] CATH LAB;  Service: Cardiovascular;  Laterality: N/A;  . Atrial tach ablation N/A 08/14/2014     Procedure: ATRIAL TACH ABLATION;  Surgeon: Marinus Maw, MD;  Location: [facility] CATH LAB;  Service: Cardiovascular;  Laterality: N/A;  . Right heart catheterization N/A 10/26/2014    Procedure: RIGHT HEART CATH;  Surgeon: Dolores Patty, MD;  Location: [facility] CATH LAB;  Service: Cardiovascular;  Laterality: N/A;   Past Medical History  Diagnosis Date  . Cardiac pacemaker     a. Since age 14 in 74. b. Upgrade to BiV in 2013.  . Congenital complete AV block   . Obesity   . GERD (gastroesophageal reflux disease)   . Asymptomatic LV dysfunction     a. Echo in Dec 2011 with EF 35 to 40%. Felt to be due to paced rhythm. b. EF 25-30% in 07/2014.  . Seizures ([facility])     as a child- from high fever  . Anxiety   . Bipolar affective disorder ([facility])   . Depression     bipolar  . Carpal tunnel syndrome of right wrist   . Asthma     seasonal allergies   . Arthritis     rheumatoid arthritis- mild, no rheumatology care   . Hypertension   . Pneumonitis     a. a/w hypoxia - inflammatory - large workup 07/2014.  [name] Sinus tachycardia (H[facility])   . Diabetes mellitus without complication ([facility])   . Presence of permanent cardiac pacemaker   . CHF (congestive heart failure) (H[facility])   . Lupus (H[facility])   . Lupus (systemic lupus erythematosus) (H[facility])    BP 115/73 mmHg  Pulse 102  Resp 14  SpO2 96%  Opioid Risk Score:   Fall Risk Score:  `1  Depression screen PHQ 2/9  Depression screen [facility] 2/9 01/09/2016 10/05/2015 07/23/2015 05/04/2015 04/27/2015 04/18/2015 03/05/2015  Decreased Interest 2 0 0 0 0 0 0  Down, Depressed, Hopeless 2 1 0 0 0 0 0  PHQ - 2 Score 4 1 0 0 0 0 0  Altered sleeping 0 - - - - - -  Tired, decreased energy 2 - - - - - -  Change in appetite 0 - - - - - -  Feeling bad or failure about yourself  0 - - - - - -  Trouble concentrating 2 - - - - - -  Moving slowly or fidgety/restless 2 - - - - - -  Suicidal thoughts 0 - - - - - -  PHQ-9 Score 10 - - - - - -  Difficult doing work/chores Not difficult  at all - - - - - -     Review of Systems  Constitutional: Negative for fever and chills.  Musculoskeletal: Negative for back pain.  Neurological: Positive for weakness. Negative for speech difficulty, numbness  and headaches.  All other systems reviewed and are negative.      Objective:   Physical Exam Constitutional: She appears well-developed and well-nourished. NAD. Vital signs reviewed.   HENT:  Normocephalic and atraumatic.   Mouth/Throat: Oropharynx is clear and moist. Dental caries present.   Eyes: Conjunctivae and EOM are normal.   Cardiovascular: Regular rhythm and tachycardic.    Respiratory: Effort normal. No accessory muscle usage. No respiratory distress. Normal breath sounds.    GI: Soft. Bowel sounds are normal. She exhibits no distension. Musculoskeletal: She exhibits no edema or tenderness.  Bilateral hands with evidence of muscle wasting.   Neurological: She is alert and oriented.  Motor: B/L UE: Shoulder abduction 5/5, elbow flexion/extension 5/5, hand grip 5/5 B/l LE: Hip flexion 3-/5, knee extension, ankle dorsi/plantar flexion 5/5 Skin: Skin is warm and dry.  Psychiatric: She has a normal mood and affect. Her speech is normal and behavior is normal. Thought content normal. Her mood appears not anxious.     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    1. Steroid myopathy  Pt continues to take 10mg  for rheumatological disorder  Follow up with Rheumatology April 2017  Cont therapies  Proximal strength improving, cont home excercises  2. HTN/Tachycardia  Pt recently saw Cardiology, who made adjustments in medications  Cont recs per cardiology.    3. Possible SLE versus  MCTD pneumonitis with interstitial lung disease  Follow up with rheumatology   Cont Cellcept and prednisone.    4. DM  Labile FSBGs  Follow with PCP regarding adjustments, recent HbA1c improved.  5. Abnormality of gait  Cont walker and wheelchair for safety  Meds reviewed Referrals reviewed. All questions answered.

## 2016-01-10 DIAGNOSIS — L930 Discoid lupus erythematosus: Secondary | ICD-10-CM | POA: Diagnosis not present

## 2016-01-10 DIAGNOSIS — M069 Rheumatoid arthritis, unspecified: Secondary | ICD-10-CM | POA: Diagnosis not present

## 2016-01-10 DIAGNOSIS — I5023 Acute on chronic systolic (congestive) heart failure: Secondary | ICD-10-CM | POA: Diagnosis not present

## 2016-01-10 DIAGNOSIS — I10 Essential (primary) hypertension: Secondary | ICD-10-CM | POA: Diagnosis not present

## 2016-01-10 DIAGNOSIS — K219 Gastro-esophageal reflux disease without esophagitis: Secondary | ICD-10-CM | POA: Diagnosis not present

## 2016-01-10 DIAGNOSIS — E1142 Type 2 diabetes mellitus with diabetic polyneuropathy: Secondary | ICD-10-CM | POA: Diagnosis not present

## 2016-01-11 DIAGNOSIS — M069 Rheumatoid arthritis, unspecified: Secondary | ICD-10-CM | POA: Diagnosis not present

## 2016-01-11 DIAGNOSIS — I5023 Acute on chronic systolic (congestive) heart failure: Secondary | ICD-10-CM | POA: Diagnosis not present

## 2016-01-11 DIAGNOSIS — L930 Discoid lupus erythematosus: Secondary | ICD-10-CM | POA: Diagnosis not present

## 2016-01-11 DIAGNOSIS — E1142 Type 2 diabetes mellitus with diabetic polyneuropathy: Secondary | ICD-10-CM | POA: Diagnosis not present

## 2016-01-11 DIAGNOSIS — I10 Essential (primary) hypertension: Secondary | ICD-10-CM | POA: Diagnosis not present

## 2016-01-11 DIAGNOSIS — K219 Gastro-esophageal reflux disease without esophagitis: Secondary | ICD-10-CM | POA: Diagnosis not present

## 2016-01-14 DIAGNOSIS — L930 Discoid lupus erythematosus: Secondary | ICD-10-CM | POA: Diagnosis not present

## 2016-01-14 DIAGNOSIS — E1142 Type 2 diabetes mellitus with diabetic polyneuropathy: Secondary | ICD-10-CM | POA: Diagnosis not present

## 2016-01-14 DIAGNOSIS — I5023 Acute on chronic systolic (congestive) heart failure: Secondary | ICD-10-CM | POA: Diagnosis not present

## 2016-01-14 DIAGNOSIS — K219 Gastro-esophageal reflux disease without esophagitis: Secondary | ICD-10-CM | POA: Diagnosis not present

## 2016-01-14 DIAGNOSIS — M069 Rheumatoid arthritis, unspecified: Secondary | ICD-10-CM | POA: Diagnosis not present

## 2016-01-14 DIAGNOSIS — I10 Essential (primary) hypertension: Secondary | ICD-10-CM | POA: Diagnosis not present

## 2016-01-16 DIAGNOSIS — L930 Discoid lupus erythematosus: Secondary | ICD-10-CM | POA: Diagnosis not present

## 2016-01-16 DIAGNOSIS — E1142 Type 2 diabetes mellitus with diabetic polyneuropathy: Secondary | ICD-10-CM | POA: Diagnosis not present

## 2016-01-16 DIAGNOSIS — K219 Gastro-esophageal reflux disease without esophagitis: Secondary | ICD-10-CM | POA: Diagnosis not present

## 2016-01-16 DIAGNOSIS — M069 Rheumatoid arthritis, unspecified: Secondary | ICD-10-CM | POA: Diagnosis not present

## 2016-01-16 DIAGNOSIS — I5023 Acute on chronic systolic (congestive) heart failure: Secondary | ICD-10-CM | POA: Diagnosis not present

## 2016-01-16 DIAGNOSIS — I10 Essential (primary) hypertension: Secondary | ICD-10-CM | POA: Diagnosis not present

## 2016-01-17 DIAGNOSIS — E1142 Type 2 diabetes mellitus with diabetic polyneuropathy: Secondary | ICD-10-CM | POA: Diagnosis not present

## 2016-01-17 DIAGNOSIS — I5023 Acute on chronic systolic (congestive) heart failure: Secondary | ICD-10-CM | POA: Diagnosis not present

## 2016-01-17 DIAGNOSIS — M069 Rheumatoid arthritis, unspecified: Secondary | ICD-10-CM | POA: Diagnosis not present

## 2016-01-17 DIAGNOSIS — L930 Discoid lupus erythematosus: Secondary | ICD-10-CM | POA: Diagnosis not present

## 2016-01-22 DIAGNOSIS — M069 Rheumatoid arthritis, unspecified: Secondary | ICD-10-CM | POA: Diagnosis not present

## 2016-01-22 DIAGNOSIS — I10 Essential (primary) hypertension: Secondary | ICD-10-CM | POA: Diagnosis not present

## 2016-01-22 DIAGNOSIS — I5023 Acute on chronic systolic (congestive) heart failure: Secondary | ICD-10-CM | POA: Diagnosis not present

## 2016-01-22 DIAGNOSIS — E1142 Type 2 diabetes mellitus with diabetic polyneuropathy: Secondary | ICD-10-CM | POA: Diagnosis not present

## 2016-01-22 DIAGNOSIS — L930 Discoid lupus erythematosus: Secondary | ICD-10-CM | POA: Diagnosis not present

## 2016-01-22 DIAGNOSIS — K219 Gastro-esophageal reflux disease without esophagitis: Secondary | ICD-10-CM | POA: Diagnosis not present

## 2016-01-24 LAB — CUP PACEART REMOTE DEVICE CHECK
Brady Statistic AP VP Percent: 15.37 %
Brady Statistic AP VS Percent: 0.02 %
Brady Statistic AS VP Percent: 83.86 %
Brady Statistic RA Percent Paced: 15.39 %
Brady Statistic RV Percent Paced: 99.23 %
Date Time Interrogation Session: 20170317175407
Implantable Lead Implant Date: 19971017
Implantable Lead Implant Date: 20130520
Implantable Lead Location: 753858
Implantable Lead Location: 753860
Lead Channel Impedance Value: 266 Ohm
Lead Channel Impedance Value: 285 Ohm
Lead Channel Impedance Value: 380 Ohm
Lead Channel Impedance Value: 475 Ohm
Lead Channel Impedance Value: 684 Ohm
Lead Channel Pacing Threshold Amplitude: 1.375 V
Lead Channel Pacing Threshold Pulse Width: 0.4 ms
Lead Channel Sensing Intrinsic Amplitude: 4.5 mV
Lead Channel Setting Pacing Amplitude: 1.75 V
MDC IDC LEAD IMPLANT DT: 19971017
MDC IDC LEAD LOCATION: 753859
MDC IDC LEAD MODEL: 4196
MDC IDC MSMT BATTERY REMAINING LONGEVITY: 38 mo
MDC IDC MSMT BATTERY VOLTAGE: 2.97 V
MDC IDC MSMT LEADCHNL LV IMPEDANCE VALUE: 418 Ohm
MDC IDC MSMT LEADCHNL LV IMPEDANCE VALUE: 437 Ohm
MDC IDC MSMT LEADCHNL RA SENSING INTR AMPL: 4.5 mV
MDC IDC MSMT LEADCHNL RV IMPEDANCE VALUE: 209 Ohm
MDC IDC MSMT LEADCHNL RV IMPEDANCE VALUE: 247 Ohm
MDC IDC MSMT LEADCHNL RV SENSING INTR AMPL: 4.75 mV
MDC IDC MSMT LEADCHNL RV SENSING INTR AMPL: 4.75 mV
MDC IDC SET LEADCHNL LV PACING PULSEWIDTH: 0.4 ms
MDC IDC SET LEADCHNL RA PACING AMPLITUDE: 2 V
MDC IDC SET LEADCHNL RV PACING AMPLITUDE: 2 V
MDC IDC SET LEADCHNL RV PACING PULSEWIDTH: 0.4 ms
MDC IDC SET LEADCHNL RV SENSING SENSITIVITY: 0.9 mV
MDC IDC STAT BRADY AS VS PERCENT: 0.76 %

## 2016-01-29 ENCOUNTER — Encounter: Payer: Self-pay | Admitting: Cardiology

## 2016-02-05 ENCOUNTER — Encounter: Payer: Self-pay | Admitting: Internal Medicine

## 2016-02-12 DIAGNOSIS — M351 Other overlap syndromes: Secondary | ICD-10-CM | POA: Diagnosis not present

## 2016-02-12 DIAGNOSIS — M3320 Polymyositis, organ involvement unspecified: Secondary | ICD-10-CM | POA: Diagnosis not present

## 2016-02-12 DIAGNOSIS — J849 Interstitial pulmonary disease, unspecified: Secondary | ICD-10-CM | POA: Diagnosis not present

## 2016-02-12 DIAGNOSIS — Z79899 Other long term (current) drug therapy: Secondary | ICD-10-CM | POA: Diagnosis not present

## 2016-02-14 ENCOUNTER — Encounter (HOSPITAL_COMMUNITY): Payer: Medicare Other

## 2016-02-15 ENCOUNTER — Inpatient Hospital Stay (HOSPITAL_COMMUNITY)
Admission: EM | Admit: 2016-02-15 | Discharge: 2016-02-20 | DRG: 091 | Disposition: A | Discharge status: Home Health | Payer: Medicare Other | Attending: Family Medicine | Admitting: Family Medicine

## 2016-02-15 ENCOUNTER — Encounter (HOSPITAL_COMMUNITY): Payer: Self-pay

## 2016-02-15 DIAGNOSIS — IMO0001 Reserved for inherently not codable concepts without codable children: Secondary | ICD-10-CM | POA: Insufficient documentation

## 2016-02-15 DIAGNOSIS — N390 Urinary tract infection, site not specified: Secondary | ICD-10-CM

## 2016-02-15 DIAGNOSIS — G720 Drug-induced myopathy: Principal | ICD-10-CM | POA: Diagnosis present

## 2016-02-15 DIAGNOSIS — E1169 Type 2 diabetes mellitus with other specified complication: Secondary | ICD-10-CM

## 2016-02-15 DIAGNOSIS — R000 Tachycardia, unspecified: Secondary | ICD-10-CM | POA: Diagnosis present

## 2016-02-15 DIAGNOSIS — Z950 Presence of cardiac pacemaker: Secondary | ICD-10-CM | POA: Diagnosis present

## 2016-02-15 DIAGNOSIS — Y92009 Unspecified place in unspecified non-institutional (private) residence as the place of occurrence of the external cause: Secondary | ICD-10-CM

## 2016-02-15 DIAGNOSIS — D638 Anemia in other chronic diseases classified elsewhere: Secondary | ICD-10-CM | POA: Diagnosis present

## 2016-02-15 DIAGNOSIS — K219 Gastro-esophageal reflux disease without esophagitis: Secondary | ICD-10-CM | POA: Diagnosis present

## 2016-02-15 DIAGNOSIS — I5023 Acute on chronic systolic (congestive) heart failure: Secondary | ICD-10-CM | POA: Diagnosis present

## 2016-02-15 DIAGNOSIS — Z9981 Dependence on supplemental oxygen: Secondary | ICD-10-CM

## 2016-02-15 DIAGNOSIS — R0682 Tachypnea, not elsewhere classified: Secondary | ICD-10-CM | POA: Diagnosis present

## 2016-02-15 DIAGNOSIS — R509 Fever, unspecified: Secondary | ICD-10-CM | POA: Diagnosis not present

## 2016-02-15 DIAGNOSIS — Q246 Congenital heart block: Secondary | ICD-10-CM

## 2016-02-15 DIAGNOSIS — Z8249 Family history of ischemic heart disease and other diseases of the circulatory system: Secondary | ICD-10-CM

## 2016-02-15 DIAGNOSIS — R262 Difficulty in walking, not elsewhere classified: Secondary | ICD-10-CM | POA: Diagnosis present

## 2016-02-15 DIAGNOSIS — E669 Obesity, unspecified: Secondary | ICD-10-CM

## 2016-02-15 DIAGNOSIS — F319 Bipolar disorder, unspecified: Secondary | ICD-10-CM | POA: Diagnosis present

## 2016-02-15 DIAGNOSIS — I959 Hypotension, unspecified: Secondary | ICD-10-CM | POA: Diagnosis present

## 2016-02-15 DIAGNOSIS — J849 Interstitial pulmonary disease, unspecified: Secondary | ICD-10-CM | POA: Diagnosis not present

## 2016-02-15 DIAGNOSIS — I5042 Chronic combined systolic (congestive) and diastolic (congestive) heart failure: Secondary | ICD-10-CM | POA: Diagnosis present

## 2016-02-15 DIAGNOSIS — R52 Pain, unspecified: Secondary | ICD-10-CM | POA: Diagnosis not present

## 2016-02-15 DIAGNOSIS — R05 Cough: Secondary | ICD-10-CM | POA: Diagnosis not present

## 2016-02-15 DIAGNOSIS — Z87891 Personal history of nicotine dependence: Secondary | ICD-10-CM

## 2016-02-15 DIAGNOSIS — Z91048 Other nonmedicinal substance allergy status: Secondary | ICD-10-CM

## 2016-02-15 DIAGNOSIS — M329 Systemic lupus erythematosus, unspecified: Secondary | ICD-10-CM | POA: Diagnosis present

## 2016-02-15 DIAGNOSIS — E785 Hyperlipidemia, unspecified: Secondary | ICD-10-CM | POA: Diagnosis present

## 2016-02-15 DIAGNOSIS — A419 Sepsis, unspecified organism: Secondary | ICD-10-CM

## 2016-02-15 DIAGNOSIS — D8989 Other specified disorders involving the immune mechanism, not elsewhere classified: Secondary | ICD-10-CM | POA: Diagnosis present

## 2016-02-15 DIAGNOSIS — I110 Hypertensive heart disease with heart failure: Secondary | ICD-10-CM | POA: Diagnosis present

## 2016-02-15 DIAGNOSIS — I10 Essential (primary) hypertension: Secondary | ICD-10-CM | POA: Diagnosis present

## 2016-02-15 DIAGNOSIS — Z7984 Long term (current) use of oral hypoglycemic drugs: Secondary | ICD-10-CM

## 2016-02-15 DIAGNOSIS — E2740 Unspecified adrenocortical insufficiency: Secondary | ICD-10-CM | POA: Diagnosis present

## 2016-02-15 DIAGNOSIS — I5022 Chronic systolic (congestive) heart failure: Secondary | ICD-10-CM | POA: Diagnosis present

## 2016-02-15 DIAGNOSIS — E876 Hypokalemia: Secondary | ICD-10-CM | POA: Diagnosis present

## 2016-02-15 DIAGNOSIS — Z888 Allergy status to other drugs, medicaments and biological substances status: Secondary | ICD-10-CM

## 2016-02-15 DIAGNOSIS — I459 Conduction disorder, unspecified: Secondary | ICD-10-CM | POA: Diagnosis present

## 2016-02-15 DIAGNOSIS — E118 Type 2 diabetes mellitus with unspecified complications: Secondary | ICD-10-CM | POA: Diagnosis present

## 2016-02-15 DIAGNOSIS — T466X5A Adverse effect of antihyperlipidemic and antiarteriosclerotic drugs, initial encounter: Secondary | ICD-10-CM | POA: Diagnosis present

## 2016-02-15 DIAGNOSIS — R6510 Systemic inflammatory response syndrome (SIRS) of non-infectious origin without acute organ dysfunction: Secondary | ICD-10-CM | POA: Diagnosis present

## 2016-02-15 DIAGNOSIS — D649 Anemia, unspecified: Secondary | ICD-10-CM | POA: Diagnosis present

## 2016-02-15 DIAGNOSIS — Z7952 Long term (current) use of systemic steroids: Secondary | ICD-10-CM

## 2016-02-15 DIAGNOSIS — Z79899 Other long term (current) drug therapy: Secondary | ICD-10-CM

## 2016-02-15 DIAGNOSIS — J9611 Chronic respiratory failure with hypoxia: Secondary | ICD-10-CM | POA: Diagnosis present

## 2016-02-15 DIAGNOSIS — R059 Cough, unspecified: Secondary | ICD-10-CM | POA: Insufficient documentation

## 2016-02-15 NOTE — ED Notes (Signed)
Pt transported from home with c/o "pain all over". Pt uses 02 PRN at home. EMS reports sat of 84% on RA.

## 2016-02-16 ENCOUNTER — Emergency Department (HOSPITAL_COMMUNITY): Payer: Medicare Other

## 2016-02-16 DIAGNOSIS — I9589 Other hypotension: Secondary | ICD-10-CM | POA: Diagnosis not present

## 2016-02-16 DIAGNOSIS — Z7984 Long term (current) use of oral hypoglycemic drugs: Secondary | ICD-10-CM | POA: Diagnosis not present

## 2016-02-16 DIAGNOSIS — N390 Urinary tract infection, site not specified: Secondary | ICD-10-CM | POA: Diagnosis not present

## 2016-02-16 DIAGNOSIS — J849 Interstitial pulmonary disease, unspecified: Secondary | ICD-10-CM | POA: Diagnosis not present

## 2016-02-16 DIAGNOSIS — J9611 Chronic respiratory failure with hypoxia: Secondary | ICD-10-CM | POA: Diagnosis present

## 2016-02-16 DIAGNOSIS — I959 Hypotension, unspecified: Secondary | ICD-10-CM | POA: Diagnosis present

## 2016-02-16 DIAGNOSIS — Y92009 Unspecified place in unspecified non-institutional (private) residence as the place of occurrence of the external cause: Secondary | ICD-10-CM | POA: Diagnosis not present

## 2016-02-16 DIAGNOSIS — Q246 Congenital heart block: Secondary | ICD-10-CM | POA: Diagnosis not present

## 2016-02-16 DIAGNOSIS — Z8249 Family history of ischemic heart disease and other diseases of the circulatory system: Secondary | ICD-10-CM | POA: Diagnosis not present

## 2016-02-16 DIAGNOSIS — Z7952 Long term (current) use of systemic steroids: Secondary | ICD-10-CM | POA: Diagnosis not present

## 2016-02-16 DIAGNOSIS — Z79899 Other long term (current) drug therapy: Secondary | ICD-10-CM | POA: Diagnosis not present

## 2016-02-16 DIAGNOSIS — G720 Drug-induced myopathy: Secondary | ICD-10-CM | POA: Diagnosis present

## 2016-02-16 DIAGNOSIS — R509 Fever, unspecified: Secondary | ICD-10-CM | POA: Diagnosis not present

## 2016-02-16 DIAGNOSIS — M609 Myositis, unspecified: Secondary | ICD-10-CM

## 2016-02-16 DIAGNOSIS — A419 Sepsis, unspecified organism: Secondary | ICD-10-CM | POA: Diagnosis not present

## 2016-02-16 DIAGNOSIS — E2740 Unspecified adrenocortical insufficiency: Secondary | ICD-10-CM | POA: Diagnosis not present

## 2016-02-16 DIAGNOSIS — Z87891 Personal history of nicotine dependence: Secondary | ICD-10-CM | POA: Diagnosis not present

## 2016-02-16 DIAGNOSIS — Z91048 Other nonmedicinal substance allergy status: Secondary | ICD-10-CM | POA: Diagnosis not present

## 2016-02-16 DIAGNOSIS — T466X5A Adverse effect of antihyperlipidemic and antiarteriosclerotic drugs, initial encounter: Secondary | ICD-10-CM | POA: Diagnosis present

## 2016-02-16 DIAGNOSIS — F3175 Bipolar disorder, in partial remission, most recent episode depressed: Secondary | ICD-10-CM | POA: Diagnosis not present

## 2016-02-16 DIAGNOSIS — Z888 Allergy status to other drugs, medicaments and biological substances status: Secondary | ICD-10-CM | POA: Diagnosis not present

## 2016-02-16 DIAGNOSIS — M329 Systemic lupus erythematosus, unspecified: Secondary | ICD-10-CM | POA: Diagnosis not present

## 2016-02-16 DIAGNOSIS — I5042 Chronic combined systolic (congestive) and diastolic (congestive) heart failure: Secondary | ICD-10-CM | POA: Diagnosis present

## 2016-02-16 DIAGNOSIS — E785 Hyperlipidemia, unspecified: Secondary | ICD-10-CM | POA: Diagnosis present

## 2016-02-16 DIAGNOSIS — I110 Hypertensive heart disease with heart failure: Secondary | ICD-10-CM | POA: Diagnosis present

## 2016-02-16 DIAGNOSIS — E118 Type 2 diabetes mellitus with unspecified complications: Secondary | ICD-10-CM | POA: Diagnosis present

## 2016-02-16 DIAGNOSIS — IMO0001 Reserved for inherently not codable concepts without codable children: Secondary | ICD-10-CM | POA: Insufficient documentation

## 2016-02-16 DIAGNOSIS — R059 Cough, unspecified: Secondary | ICD-10-CM | POA: Insufficient documentation

## 2016-02-16 DIAGNOSIS — M791 Myalgia: Secondary | ICD-10-CM | POA: Diagnosis not present

## 2016-02-16 DIAGNOSIS — E876 Hypokalemia: Secondary | ICD-10-CM | POA: Diagnosis present

## 2016-02-16 DIAGNOSIS — R52 Pain, unspecified: Secondary | ICD-10-CM | POA: Diagnosis not present

## 2016-02-16 DIAGNOSIS — K219 Gastro-esophageal reflux disease without esophagitis: Secondary | ICD-10-CM | POA: Diagnosis present

## 2016-02-16 DIAGNOSIS — Z9981 Dependence on supplemental oxygen: Secondary | ICD-10-CM | POA: Diagnosis not present

## 2016-02-16 DIAGNOSIS — R000 Tachycardia, unspecified: Secondary | ICD-10-CM | POA: Diagnosis present

## 2016-02-16 DIAGNOSIS — R05 Cough: Secondary | ICD-10-CM | POA: Diagnosis not present

## 2016-02-16 DIAGNOSIS — R6510 Systemic inflammatory response syndrome (SIRS) of non-infectious origin without acute organ dysfunction: Secondary | ICD-10-CM | POA: Diagnosis present

## 2016-02-16 DIAGNOSIS — R262 Difficulty in walking, not elsewhere classified: Secondary | ICD-10-CM | POA: Diagnosis present

## 2016-02-16 DIAGNOSIS — Z950 Presence of cardiac pacemaker: Secondary | ICD-10-CM | POA: Diagnosis not present

## 2016-02-16 DIAGNOSIS — D638 Anemia in other chronic diseases classified elsewhere: Secondary | ICD-10-CM | POA: Diagnosis not present

## 2016-02-16 DIAGNOSIS — R0682 Tachypnea, not elsewhere classified: Secondary | ICD-10-CM | POA: Diagnosis present

## 2016-02-16 LAB — COMPREHENSIVE METABOLIC PANEL
ALK PHOS: 59 U/L (ref 38–126)
ALT: 26 U/L (ref 14–54)
ANION GAP: 11 (ref 5–15)
AST: 92 U/L — ABNORMAL HIGH (ref 15–41)
Albumin: 2.3 g/dL — ABNORMAL LOW (ref 3.5–5.0)
BUN: 10 mg/dL (ref 6–20)
CALCIUM: 7.9 mg/dL — AB (ref 8.9–10.3)
CO2: 24 mmol/L (ref 22–32)
Chloride: 102 mmol/L (ref 101–111)
Creatinine, Ser: 0.53 mg/dL (ref 0.44–1.00)
GFR calc non Af Amer: >60 mL/min (ref >60)
Glucose, Bld: 125 mg/dL — ABNORMAL HIGH (ref 65–99)
Potassium: 4.2 mmol/L (ref 3.5–5.1)
Sodium: 137 mmol/L (ref 135–145)
Total Bilirubin: 1.9 mg/dL — ABNORMAL HIGH (ref 0.3–1.2)
Total Protein: 7 g/dL (ref 6.5–8.1)

## 2016-02-16 LAB — CREATININE, SERUM
CREATININE: 0.46 mg/dL (ref 0.44–1.00)
GFR calc Af Amer: >60 mL/min (ref >60)

## 2016-02-16 LAB — GLUCOSE, CAPILLARY
GLUCOSE-CAPILLARY: 135 mg/dL — AB (ref 65–99)
GLUCOSE-CAPILLARY: 192 mg/dL — AB (ref 65–99)
Glucose-Capillary: 144 mg/dL — ABNORMAL HIGH (ref 65–99)
Glucose-Capillary: 173 mg/dL — ABNORMAL HIGH (ref 65–99)

## 2016-02-16 LAB — URINE MICROSCOPIC-ADD ON

## 2016-02-16 LAB — URINALYSIS, ROUTINE W REFLEX MICROSCOPIC
Glucose, UA: NEGATIVE mg/dL
KETONES UR: 15 mg/dL — AB
NITRITE: POSITIVE — AB
PROTEIN: 100 mg/dL — AB
Specific Gravity, Urine: 1.038 — ABNORMAL HIGH (ref 1.005–1.030)
pH: 6 (ref 5.0–8.0)

## 2016-02-16 LAB — TSH: TSH: 2.516 u[IU]/mL (ref 0.350–4.500)

## 2016-02-16 LAB — TROPONIN I

## 2016-02-16 LAB — CBC
HCT: 30.2 % — ABNORMAL LOW (ref 36.0–46.0)
Hemoglobin: 9.1 g/dL — ABNORMAL LOW (ref 12.0–15.0)
MCH: 25.8 pg — ABNORMAL LOW (ref 26.0–34.0)
MCHC: 30.1 g/dL (ref 30.0–36.0)
MCV: 85.6 fL (ref 78.0–100.0)
Platelets: 255 10*3/uL (ref 150–400)
RBC: 3.53 MIL/uL — ABNORMAL LOW (ref 3.87–5.11)
RDW: 17.8 % — AB (ref 11.5–15.5)
WBC: 6.1 10*3/uL (ref 4.0–10.5)

## 2016-02-16 LAB — I-STAT BETA HCG BLOOD, ED (MC, WL, AP ONLY): I-stat hCG, quantitative: <5 m[IU]/mL (ref <5)

## 2016-02-16 LAB — CBC WITH DIFFERENTIAL/PLATELET
BASOS ABS: 0 10*3/uL (ref 0.0–0.1)
BASOS PCT: 0 %
EOS PCT: 1 %
Eosinophils Absolute: 0.1 10*3/uL (ref 0.0–0.7)
HEMATOCRIT: 35.5 % — AB (ref 36.0–46.0)
Hemoglobin: 10.9 g/dL — ABNORMAL LOW (ref 12.0–15.0)
Lymphocytes Relative: 18 %
Lymphs Abs: 1.4 10*3/uL (ref 0.7–4.0)
MCH: 25.6 pg — ABNORMAL LOW (ref 26.0–34.0)
MCHC: 30.7 g/dL (ref 30.0–36.0)
MCV: 83.5 fL (ref 78.0–100.0)
MONO ABS: 0.6 10*3/uL (ref 0.1–1.0)
Monocytes Relative: 8 %
NEUTROS ABS: 5.9 10*3/uL (ref 1.7–7.7)
Neutrophils Relative %: 73 %
PLATELETS: 345 10*3/uL (ref 150–400)
RBC: 4.25 MIL/uL (ref 3.87–5.11)
RDW: 18.1 % — AB (ref 11.5–15.5)
WBC: 8 10*3/uL (ref 4.0–10.5)

## 2016-02-16 LAB — BRAIN NATRIURETIC PEPTIDE: B Natriuretic Peptide: 56.9 pg/mL (ref 0.0–100.0)

## 2016-02-16 LAB — INFLUENZA PANEL BY PCR (TYPE A & B)
H1N1FLUPCR: NOT DETECTED
INFLAPCR: NEGATIVE
INFLBPCR: NEGATIVE

## 2016-02-16 LAB — MRSA PCR SCREENING: MRSA by PCR: POSITIVE — AB

## 2016-02-16 LAB — ANTI-SMITH ANTIBODY

## 2016-02-16 LAB — ANTI-DNA ANTIBODY, DOUBLE-STRANDED: ds DNA Ab: <1 IU/mL (ref 0–9)

## 2016-02-16 LAB — I-STAT CG4 LACTIC ACID, ED
LACTIC ACID, VENOUS: 1.78 mmol/L (ref 0.5–2.0)
Lactic Acid, Venous: 1.1 mmol/L (ref 0.5–2.0)

## 2016-02-16 LAB — ANTI-JO 1 ANTIBODY, IGG: Anti JO-1: >8 AI — ABNORMAL HIGH (ref 0.0–0.9)

## 2016-02-16 LAB — CK: Total CK: 3694 U/L — ABNORMAL HIGH (ref 38–234)

## 2016-02-16 MED ORDER — VANCOMYCIN HCL IN DEXTROSE 750-5 MG/150ML-% IV SOLN
750.0000 mg | Freq: Three times a day (TID) | INTRAVENOUS | Status: DC
  Administered 2016-02-16: 750 mg via INTRAVENOUS
  Filled 2016-02-16 (×3): qty 150

## 2016-02-16 MED ORDER — MYCOPHENOLATE MOFETIL 250 MG PO CAPS
500.0000 mg | ORAL_CAPSULE | Freq: Two times a day (BID) | ORAL | Status: DC
  Administered 2016-02-16 – 2016-02-20 (×9): 500 mg via ORAL
  Filled 2016-02-16 (×10): qty 2

## 2016-02-16 MED ORDER — INSULIN ASPART 100 UNIT/ML ~~LOC~~ SOLN
0.0000 [IU] | Freq: Three times a day (TID) | SUBCUTANEOUS | Status: DC
  Administered 2016-02-16: 2 [IU] via SUBCUTANEOUS
  Administered 2016-02-17 (×2): 1 [IU] via SUBCUTANEOUS
  Administered 2016-02-18: 5 [IU] via SUBCUTANEOUS
  Administered 2016-02-18: 2 [IU] via SUBCUTANEOUS
  Administered 2016-02-18: 1 [IU] via SUBCUTANEOUS
  Administered 2016-02-19 (×2): 5 [IU] via SUBCUTANEOUS
  Administered 2016-02-19: 7 [IU] via SUBCUTANEOUS
  Administered 2016-02-20: 5 [IU] via SUBCUTANEOUS
  Administered 2016-02-20: 3 [IU] via SUBCUTANEOUS

## 2016-02-16 MED ORDER — MOMETASONE FURO-FORMOTEROL FUM 100-5 MCG/ACT IN AERO
2.0000 | INHALATION_SPRAY | Freq: Two times a day (BID) | RESPIRATORY_TRACT | Status: DC
  Administered 2016-02-17 – 2016-02-20 (×7): 2 via RESPIRATORY_TRACT
  Filled 2016-02-16: qty 8.8

## 2016-02-16 MED ORDER — SODIUM CHLORIDE 0.9 % IV BOLUS (SEPSIS)
1000.0000 mL | INTRAVENOUS | Status: AC
  Administered 2016-02-16 (×2): 1000 mL via INTRAVENOUS

## 2016-02-16 MED ORDER — CALCIUM CARBONATE-VITAMIN D 500-200 MG-UNIT PO TABS
1.0000 | ORAL_TABLET | Freq: Every day | ORAL | Status: DC
  Administered 2016-02-17 – 2016-02-20 (×4): 1 via ORAL
  Filled 2016-02-16 (×4): qty 1

## 2016-02-16 MED ORDER — FENTANYL CITRATE (PF) 100 MCG/2ML IJ SOLN
50.0000 ug | Freq: Once | INTRAMUSCULAR | Status: AC
  Administered 2016-02-16: 50 ug via INTRAVENOUS
  Filled 2016-02-16: qty 2

## 2016-02-16 MED ORDER — DEXTROSE 5 % IV SOLN
1.0000 g | Freq: Three times a day (TID) | INTRAVENOUS | Status: DC
  Filled 2016-02-16 (×2): qty 1

## 2016-02-16 MED ORDER — DEXTROSE 5 % IV SOLN
2.0000 g | Freq: Once | INTRAVENOUS | Status: AC
  Administered 2016-02-16: 2 g via INTRAVENOUS
  Filled 2016-02-16: qty 2

## 2016-02-16 MED ORDER — LORATADINE 10 MG PO TABS: 10.0000 mg | ORAL_TABLET | Freq: Every day | ORAL | Status: DC | PRN

## 2016-02-16 MED ORDER — VANCOMYCIN HCL IN DEXTROSE 1-5 GM/200ML-% IV SOLN
1000.0000 mg | Freq: Once | INTRAVENOUS | Status: AC
  Administered 2016-02-16: 1000 mg via INTRAVENOUS
  Filled 2016-02-16: qty 200

## 2016-02-16 MED ORDER — FLUTICASONE PROPIONATE 50 MCG/ACT NA SUSP
2.0000 | Freq: Every day | NASAL | Status: DC
  Administered 2016-02-16 – 2016-02-20 (×5): 2 via NASAL
  Filled 2016-02-16: qty 16

## 2016-02-16 MED ORDER — SODIUM CHLORIDE 0.9% FLUSH
3.0000 mL | Freq: Two times a day (BID) | INTRAVENOUS | Status: DC
  Administered 2016-02-16 – 2016-02-20 (×7): 3 mL via INTRAVENOUS

## 2016-02-16 MED ORDER — VANCOMYCIN HCL IN DEXTROSE 750-5 MG/150ML-% IV SOLN
750.0000 mg | Freq: Three times a day (TID) | INTRAVENOUS | Status: DC
  Administered 2016-02-16 – 2016-02-17 (×2): 750 mg via INTRAVENOUS
  Filled 2016-02-16 (×4): qty 150

## 2016-02-16 MED ORDER — SODIUM CHLORIDE 0.9 % IV BOLUS (SEPSIS)
1000.0000 mL | Freq: Once | INTRAVENOUS | Status: AC
  Administered 2016-02-16: 1000 mL via INTRAVENOUS

## 2016-02-16 MED ORDER — SENNOSIDES-DOCUSATE SODIUM 8.6-50 MG PO TABS
3.0000 | ORAL_TABLET | Freq: Two times a day (BID) | ORAL | Status: DC | PRN
  Administered 2016-02-18: 3 via ORAL
  Filled 2016-02-16: qty 3

## 2016-02-16 MED ORDER — INSULIN ASPART 100 UNIT/ML ~~LOC~~ SOLN
0.0000 [IU] | Freq: Every day | SUBCUTANEOUS | Status: DC
  Administered 2016-02-19: 5 [IU] via SUBCUTANEOUS

## 2016-02-16 MED ORDER — POLYETHYLENE GLYCOL 3350 17 G PO PACK
17.0000 g | PACK | Freq: Every day | ORAL | Status: DC | PRN
  Administered 2016-02-18: 17 g via ORAL
  Filled 2016-02-16: qty 1

## 2016-02-16 MED ORDER — CHLORHEXIDINE GLUCONATE CLOTH 2 % EX PADS
6.0000 | MEDICATED_PAD | Freq: Every day | CUTANEOUS | Status: DC
  Administered 2016-02-17 – 2016-02-20 (×3): 6 via TOPICAL

## 2016-02-16 MED ORDER — SODIUM CHLORIDE 0.9 % IV BOLUS (SEPSIS): 500.0000 mL | INTRAVENOUS | Status: DC

## 2016-02-16 MED ORDER — HYDROCORTISONE 20 MG PO TABS
30.0000 mg | ORAL_TABLET | Freq: Two times a day (BID) | ORAL | Status: DC
  Administered 2016-02-17 (×2): 30 mg via ORAL
  Filled 2016-02-16 (×4): qty 1

## 2016-02-16 MED ORDER — MUPIROCIN 2 % EX OINT
1.0000 "application " | TOPICAL_OINTMENT | Freq: Two times a day (BID) | CUTANEOUS | Status: DC
  Administered 2016-02-16 – 2016-02-20 (×8): 1 via NASAL
  Filled 2016-02-16 (×2): qty 22

## 2016-02-16 MED ORDER — HYDROCORTISONE NA SUCCINATE PF 100 MG IJ SOLR
100.0000 mg | Freq: Once | INTRAMUSCULAR | Status: AC
  Administered 2016-02-16: 100 mg via INTRAVENOUS
  Filled 2016-02-16: qty 2

## 2016-02-16 MED ORDER — PIPERACILLIN-TAZOBACTAM 3.375 G IVPB
3.3750 g | Freq: Three times a day (TID) | INTRAVENOUS | Status: DC
  Administered 2016-02-16 – 2016-02-17 (×4): 3.375 g via INTRAVENOUS
  Filled 2016-02-16 (×5): qty 50

## 2016-02-16 MED ORDER — ENOXAPARIN SODIUM 40 MG/0.4ML ~~LOC~~ SOLN
40.0000 mg | SUBCUTANEOUS | Status: DC
  Administered 2016-02-16 – 2016-02-20 (×5): 40 mg via SUBCUTANEOUS
  Filled 2016-02-16 (×5): qty 0.4

## 2016-02-16 MED ORDER — ALBUTEROL SULFATE (2.5 MG/3ML) 0.083% IN NEBU: 3.0000 mL | INHALATION_SOLUTION | RESPIRATORY_TRACT | Status: DC | PRN

## 2016-02-16 NOTE — ED Provider Notes (Signed)
The patient appears reasonably stabilized for transfer considering the current resources, flow, and capabilities available in the ED at this time, and I doubt any other [facility] requiring further screening and/or treatment in the ED prior to transfer.  BP 95/62 mmHg  Pulse 111  Temp(Src) 99.4 F (37.4 C) (Oral)  Resp 21  Ht 5\' 5"  (1.651 m)  Wt 68.04 kg  BMI 24.96 kg/m2  SpO2 96%   , MD 02/16/16 0710

## 2016-02-16 NOTE — Progress Notes (Signed)
Pharmacy Antibiotic Note  Makayla Vasquez is a 35 y.o. female with PMH of lupus, CHF, DM, HTN who presents to [facility] ED on 02/15/2016 with generalized body aches, fever, SOB, cough, HA, and diarrhea. Pharmacy has been consulted for Vancomycin and now to change Cefepime to Zosyn.     Plan: Continue with Vancomycin 750mg  IV q8h. Plan for Vancomycin trough level at steady state. Goal trough level 15-20 mcg/mL.  Zosyn 3.375g IV every 8 hours- 4hr infusion.  D/c Cefepime.  Monitor renal function, cultures, clinical course.  Height: 5\' 5"  (165.1 cm) Weight: 158 lb 4.6 oz (71.8 kg) IBW/kg (Calculated) : 57  Temp (24hrs), Avg:99.2 F (37.3 C), Min:98.2 F (36.8 C), Max:100 F (37.8 C)   Recent Labs Lab 02/16/16 0012 02/16/16 0032 02/16/16 0322 02/16/16 1149  WBC 8.0  --   --  6.1  CREATININE 0.53  --   --   --   LATICACIDVEN  --  1.78 1.10  --     Estimated Creatinine Clearance: 97.5 mL/min (by C-G formula based on Cr of 0.53).    Allergies  Allergen Reactions  . Sertraline Hcl Hives  . Tape Other (See Comments)    Burns skin    Antimicrobials this admission: 4/29 Cefepime >> 4/29 4/29 Vancomycin >> 4/29 Zosyn >>  Dose adjustments this admission: --  Microbiology results: 4/29 BCx: sent 4/29 UCx: ordered  Thank you for allowing pharmacy to be a part of this patient's care.  5/29, PharmD, BCPS Clinical Pharmacist [phone] 02/16/2016, 12:33 PM

## 2016-02-16 NOTE — Progress Notes (Signed)
Spoke with Dr. Earlene Plater on [facility] Med Teaching Service.  She confirmed the patient is on their service, and they will be putting orders in.

## 2016-02-16 NOTE — ED Provider Notes (Signed)
Pt seen by critical care Advised to give hydrocortisone as she is on chronic steroids She can be admitted to family medicine D/w fam medicine resident Admit to SDU at [facility]   Zadie Rhine, MD 02/16/16 [phone]

## 2016-02-16 NOTE — ED Notes (Signed)
EKG give to [facility] for review.

## 2016-02-16 NOTE — Consult Note (Signed)
PULMONARY / CRITICAL CARE MEDICINE   Name: Makayla Vasquez MRN: [id number] DOB: Mar 03, 1981    ADMISSION DATE:  02/15/2016 CONSULTATION DATE:  02/16/2016  REFERRING MD :  Dr. Christy Gentles  CHIEF COMPLAINT:  Myalgia.   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  She has a historical diagnosis of a CTD, originally diagnosed with SLE 1 year ago and placed on mycophenolate and prednisone.  She presents today with approximately 1 week of muscle and body pain.  2 weeks ago she had a cough productive of green sputum which cleared, then she developed pain in her thighs bilaterally which spread to the rest of her body.  She also reports associated, cough, nausea an foul smelling urine with subjective fevers, myalgias and chills. She states her breathing is not significantly worse than her baseline and she has not had to turn up her O2.  She has been taking her immunosuppression as prescribed.  In the ED she received Vanc/cefepime and 3L NS.  Her serum lactic acid was negative x2 but she remained hypotensive.   PAST MEDICAL HISTORY :   has a past medical history of Cardiac pacemaker; Congenital complete AV block; Obesity; GERD (gastroesophageal reflux disease); Asymptomatic LV dysfunction; Seizures ([place]); Anxiety; Bipolar affective disorder ([place]); Depression; Carpal tunnel syndrome of right wrist; Asthma; Arthritis; Hypertension; Pneumonitis; Sinus tachycardia ([place]); Diabetes mellitus without complication ([place]); Presence of permanent cardiac pacemaker; CHF (congestive heart failure) ([place]); Lupus ([place]); and Lupus (systemic lupus erythematosus) ([place]).  has past surgical history that includes Throat surgery (1994); Cesarean section; Insert / replace / remove pacemaker; Cholecystectomy; IUD removal (11/03/2011);  Cyst excision (12/10/2012); Thyroglossal duct cyst (N/A, 12/10/2012); Nasal fracture surgery; Carpal tunnel with cubital tunnel (Right, 07/26/2013); Video bronchoscopy (Bilateral, 06/19/2014); bi-ventricular pacemaker upgrade (N/A, 03/08/2012); Atrial tach ablation (N/A, 08/14/2014); and right heart catheterization (N/A, 10/26/2014). Prior to Admission medications   Medication Sig Start Date End Date Taking? Authorizing Provider  albuterol (PROVENTIL HFA;VENTOLIN HFA) 108 (90 BASE) MCG/ACT inhaler Inhale 2 puffs into the lungs every 4 (four) hours as needed for wheezing or shortness of breath. 06/05/15  Yes Alexander J Karamalegos, DO  atorvastatin (LIPITOR) 40 MG tablet Take 1 tablet (40 mg total) by mouth daily. 05/04/15  Yes Alexander Devin Going, DO  bisoprolol (ZEBETA) 5 MG tablet Take 0.5 tablets (2.5 mg total) by mouth daily. 01/08/16  Yes [name], MD  Blood Glucose Monitoring Suppl (ONE TOUCH ULTRA 2) w/Device KIT Check blood sugar before meals and at bedtim 12/27/15  Yes [name], PA-C  calcium-vitamin D (OSCAL WITH D) 500-200 MG-UNIT per tablet Take 1 tablet by mouth daily with breakfast.    Yes Historical Provider, MD  camphor-menthol [place]oteo Ace) lotion Apply topically 2 (two) times daily. Patient taking differently: Apply 1 application topically 2 (two) times daily as needed for itching.  12/26/15  Yes [name], PA-C  etonogestrel (NEXPLANON) 68 MG IMPL implant 1 each by Subdermal route once.   Yes Historical Provider, MD  famotidine (PEPCID) 20 MG tablet Take 1 tablet (20 mg total) by mouth 2 (two) times daily. 12/26/15  Yes [name], PA-C  fluocinonide (LIDEX) 0.05 % external solution 1 APPLICATION APPLY ON THE SKIN AS DIRECTED APPLY TO AFFECTED AREAS DAILY ON SCALP AS NEEDED FOR ITCHING. 09/28/15  Yes Historical Provider,  MD  furosemide (LASIX) 20 MG tablet Take 20 mg by mouth daily. 11/19/15  Yes Historical Provider, MD  glucose blood (ONE TOUCH ULTRA TEST) test strip Check blood  sugar daily before breakfast 10/25/14  Yes Leone Brand, MD  losartan (COZAAR) 25 MG tablet Take 0.5 tablets (12.5 mg total) by mouth daily. 01/08/16  Yes [name], MD  metFORMIN (GLUCOPHAGE) 1000 MG tablet TAKE 1 TABLET BY MOUTH TWICE A DAY WITH A MEAL 05/18/15  Yes [name], DO  mycophenolate (CELLCEPT) 500 MG tablet Take 500 mg by mouth 2 (two) times daily.   Yes Historical Provider, MD  polyethylene glycol (MIRALAX / GLYCOLAX) packet Take 17 g by mouth daily after supper. Patient taking differently: Take 17 g by mouth daily as needed for mild constipation.  12/26/15  Yes [name], PA-C  predniSONE (DELTASONE) 10 MG tablet Take 30 mg by mouth 2 (two) times daily.   Yes Historical Provider, MD  senna-docusate (SENOKOT-S) 8.6-50 MG tablet Take 3 tablets by mouth 2 (two) times daily. Patient taking differently: Take 3 tablets by mouth 2 (two) times daily as needed for mild constipation.  12/26/15  Yes [name], PA-C  budesonide-formoterol (SYMBICORT) 80-4.5 MCG/ACT inhaler Inhale 2 puffs into the lungs 2 (two) times daily as needed (for shortness of breath). 01/07/16   [name], NP  fluticasone (FLONASE) 50 MCG/ACT nasal spray Place 2 sprays into both nostrils daily. 01/07/16   [name], NP  loratadine (CLARITIN) 10 MG tablet Take 1 tablet (10 mg total) by mouth daily. Patient taking differently: Take 10 mg by mouth daily as needed.  01/07/16   [name], NP  predniSONE (DELTASONE) 20 MG tablet From 3/9-3/10: Take 30 mg in am and 20 mg in pm From 3/11-3/14: Take 20 mg in am and 20 mg in pm From 3/15- 3/18 Take 20 mg in am and 10 mg in pm From 3/19-3/22 Take 10 mg in am and 10 mg in pm From 3/22 on Take 10 mg daily Patient not taking: Reported on 02/16/2016 12/27/15   [name], PA-C   Allergies  Allergen Reactions  . Sertraline Hcl Hives  . Tape Other (See Comments)    Burns skin    FAMILY HISTORY:  indicated that her mother is alive. She indicated  that her father is alive. She indicated that her sister is alive.  SOCIAL HISTORY:  reports that she quit smoking about 19 years ago. Her smoking use included Cigarettes. She has a .125 pack-year smoking history. She has never used smokeless tobacco. She reports that she does not drink alcohol or use illicit drugs.  REVIEW OF SYSTEMS:   Reports: Fevers, chills, cough, myalgias, abdominal pain, nausea, foul smelling urine Denies: Sweats, rashes, joint pain, joint swelling, vomiting, diarrhea, sick contacts.  SUBJECTIVE:   VITAL SIGNS: Temp:  [99.4 F (37.4 C)-100 F (37.8 C)] 99.4 F (37.4 C) (04/29 0336) Pulse Rate:  [102-136] 102 (04/29 0336) Resp:  [16-44] 41 (04/29 0424) BP: (86-113)/(47-66) 92/49 mmHg (04/29 0424) SpO2:  [98 %-100 %] 98 % (04/29 0336) Weight:  [68.04 kg (150 lb)] 68.04 kg (150 lb) (04/29 0018) HEMODYNAMICS:   VENTILATOR SETTINGS:   INTAKE / OUTPUT: No intake or output data in the 24 hours ending 02/16/16 0447  PHYSICAL EXAMINATION: General:  Ill appearing, NAD Neuro:  CN II-XII intact, no focal deficits,  HEENT:  PERLA Cardiovascular:  RRR, s1/s2 no m/r/g Lungs:  CTA b/l no  w/r/r Abdomen:  Soft, mild diffuse TTP, normal bowel sounds, no ascites Musculoskeletal:  Normal bulk and tone, no hot swollen joints Skin:  No rashes, c/c/e  LABS:  CBC  Recent Labs Lab 02/16/16 0012  WBC 8.0  HGB 10.9*  HCT 35.5*  PLT 345   Coag's No results for input(s): APTT, INR in the last 168 hours. BMET  Recent Labs Lab 02/16/16 0012  NA 137  K 4.2  CL 102  CO2 24  BUN 10  CREATININE 0.53  GLUCOSE 125*   Electrolytes  Recent Labs Lab 02/16/16 0012  CALCIUM 7.9*   Sepsis Markers  Recent Labs Lab 02/16/16 0032 02/16/16 0322  LATICACIDVEN 1.78 1.10   ABG No results for input(s): PHART, PCO2ART, PO2ART in the last 168 hours. Liver Enzymes  Recent Labs Lab 02/16/16 0012  AST 92*  ALT 26  ALKPHOS 59  BILITOT 1.9*  ALBUMIN 2.3*    Cardiac Enzymes No results for input(s): TROPONINI, PROBNP in the last 168 hours. Glucose No results for input(s): GLUCAP in the last 168 hours.  Imaging Dg Chest 2 View  02/16/2016  CLINICAL DATA:  Generalized body aches, cough and fever for 1 week. History of hypertension, diabetes, lupus. EXAM: CHEST  2 VIEW COMPARISON:  Chest radiograph December 19, 2015 FINDINGS: Cardiomediastinal silhouette is normal for this low inspiratory portable examination with crowded vascular markings. Pulmonary vascular congestion without pleural effusion or focal consolidation. Mild interstitial prominence. LEFT cardiac pacemaker in situ. No pneumothorax. Soft tissue planes and included osseous structures are nonsuspicious. Surgical clips in the included right abdomen compatible with cholecystectomy. IMPRESSION: Pulmonary vascular congestion and mild interstitial prominence suggesting early pulmonary edema without focal consolidation. Electronically Signed   By: Elon Alas M.D.   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  She is hypotensive after appropriate 30cc/kg volume resuscitation but her lactic acid is normal x2 and she has no other evidence of end-organ hypoperfusion, to suggest shock.     Recommend stepdown bed admission.  PULMONARY  A: PNA - in immunocompromised Chronic hypoxic respiratory failure - 2-4L O2 chronic, currently not decompensated ILD- NSIP radiographic pattern, vs COP (though seems too diffuse for this) unclear etiology possible pulmonary manifestations of underlying occult CTD.  I doubt SLE as she does not meet the ACR diagnostic criteria for this dx.  P:   - Vanc/zosyn - continue home O2 - Continue symbicort 80-4.5 2puffs BID - Increased steroids to hydrocortisone 187m IV now x1 then 372mPO BID - diuresis when BP will  tolerate after sepsis resolves - RVP - Sputum Cx - continue Mycophenolate 50038mO BID.  After acute infection this should be up titrated as outpatient in an attempt to wean off prednisone - Recommend open lung biopsy when acute illness is resolved  CARDIOVASCULAR  A:  Hypotension without evidence of shock HFrEF - s/p CRT AV block - s/p Pacer and CRT  P:  - s/p 30cc/kg volume resucitation - no further fluid at this time - increase steroids as above - hold home losartan and metformin - hold home furosemide - resume diuretics when BP tolerates  RENAL A:   Not active P:   - avoid nephrotoxins  GASTROINTESTINAL A:   Mild traansamonitis  Mild hyperbili  P:   - recheck LFTs in AM  HEMATOLOGIC A:  Anemia of chronic disease  P:  - per admitting team  INFECTIOUS A:   Sepsis - pulmonary vs urinary source P:   BCx2 02/16/2016 UC 02/16/2016 Sputum ordered Abx: Vanc/zosyn , start date4/29/2017 - chronic immunosuppression and steroids >60m daily excludes use of procalcitonin  ENDOCRINE A:   DM   P:   - hold metformin - management per primary team  NEUROLOGIC A:   Not acive P:      FAMILY  - Updates: patient and family updated at bedside today.  Total critical care time: 30 min  Critical care time was exclusive of separately billable procedures and treating other patients.  Critical care was necessary to treat or prevent imminent or life-threatening deterioration.  Critical care was time spent personally by me on the following activities: development of treatment plan with patient and/or surrogate as well as nursing, discussions with consultants, evaluation of patient's response to treatment, examination of patient, obtaining history from patient or surrogate, ordering and performing treatments and interventions, ordering and review of laboratory studies, ordering and review of radiographic studies, pulse oximetry and re-evaluation of patient's  condition.   DMeribeth Mattes DO., MS [facility]       Pulmonary and CEast LibertyPager: [phone] 02/16/2016, 4:47 AM

## 2016-02-16 NOTE — H&P (Signed)
[facility] Admission History and Physical Service Pager: [phone]  Patient name: Makayla Vasquez Medical record number: [id number] Date of birth: 1981/09/04 Age: 35 y.o. Gender: female  Primary Care Provider: Nobie Putnam, DO Consultants: CCM  Code Status: FULL   Chief Complaint: Weakness, Cough, Myalgias   Assessment and Plan: Makayla Vasquez is a 35 y.o. female presenting with weakness, cough, and myalgias. Concern for sepsis. PMH is significant for HFrEF (EF 40-45%), T2DM, congenital AV block with pace maker, and interstitial lung disease on chronic prednisone and immunosuppressive therapy.   Sepsis: Patient meets SIRs criteria with tachycardia and tachypnea. Qsofa score of 2. Patient with hypotension after appropriate volume resuscitation. Lactic acid normal and no other evidence of end-organ damage. From reviewing prior notes, patient appears to have documented hypotension at baseline. UA concerning for infectious process positive nitrites, trace leukocytes, 6-30 WBC, and many bacteria; however dirty catch with 6-30 squamous epithelial cells. Patient does have a history of severe pyelonephritis. Potential pulmonary infectious process given new onset cough and need for continuous home O2. CXR without focal consolidation. Patient is afebrile and without leukocytosis. Broad spectrum antibiotics initiated in ED.    HFrEF (EF40-45%): CXR with signs of possible pulmonary edema although lung exam is unremarkable for crackles. No clinical signs of fluid overload.  - Daily weights and strict I/O -holding home Bisprolol, Cozaar, and Lasix in the setting of hypotension   Interstitial Lung Disease, on chronic prednisone and immunosuppressive therapy: On 4L O2 at home  - supplemental O2, to keep O2 > 92% - continue home Cellcept 500 mg BID, per Pulm after acute infection this should be titrated up as outpatient to wean off prednisone  -diuresis when BP will  tolerate after sepsis resolves  -s/p Hydrocortisone 100 mg IV x1, then 30 mg PO BID  - cont Dulera BID and Albuterol PRN  -recommended open lung biopsy when acute infection resolves   Elevated CK: With associated weakness and myalgias. Patient with h/o steroid induced myopathy. Also concern for potential viral myositis. Patient also taking statin at home. Patient with h/o elevated TSH at previous hospitalization so potential for hypothyroid myopathy  -hold home Lipitor  -re-check CK in AM  -check troponin  -influenza panel   Myalgias/LE Weakness: Patient does have h/o of presenting with similar complaints. Did have stay in CIR recently for similar presentation.  -work up as above for potential causes  -PT/OT consults   T2DM: From chronic steroid use. CBG 135 on admission. At rehab discharge in March, patient was instructed to d/c insulin use as had episodes of hypoglycemia as steroid dose was lowered.  - heart healthy carb modified diet -hold home metformin  -sensitive SSI  -CBGs AC/HS  -anticipate CBGs to rise with stress dose steroids   Congenital AVN Block s/p atrial pacer (placed at age 59yo):  - on telemetry    FEN/GI: Heart Healthy/Carb Modified Diet, SLIV Prophylaxis:  Disposition: Lovenox   History of Present Illness:  Makayla Vasquez is a 35 y.o. female presenting with weakness, muscle aches, and new cough. Reports muscle aches started gradually from her toes up and then she became so weak she had difficulty walking. Is not currently in pain but continues to feel weak. Cough started about 1 week ago. Was originally productive for several days with green sputum but is now non-productive. She feels nauseous with coughing but has not vomited. Endorses occasional episodes of chest pain lasting for a few minutes at a time.  Believes they have happened about 2-3 times in the past week. Chest pain varies in location.   Patient also notes diffuse abdominal pain. Admits to foul  smelling urine. Denies dysuria.     Review Of Systems: Per HPI with the following additions: Ongoing loose stools x1 month.  Otherwise the remainder of the systems were negative.  Patient Active Problem List   Diagnosis Date Noted  . Sepsis ([place]) 02/16/2016  . Steroid-induced hyperglycemia   . Slow transit constipation   . Acute on chronic systolic CHF (congestive heart failure) ([place])   . Type 2 diabetes mellitus with complication, with long-term current use of insulin ([place])   . Anemia of chronic disease   . Bipolar affective disorder in remission ([place])   . Debility 12/20/2015  . Generalized weakness   . Lower extremity weakness   . Acute on chronic congestive heart failure ([place])   . AV block   . Acute blood loss anemia   . Supplemental oxygen dependent   . Tachypnea   . Critical illness neuropathy ([place])   . Steroid myopathy   . Palpitations   . Complete heart block ([place])   . Cardiac pacemaker in situ   . Diarrhea 12/14/2015  . Viral gastroenteritis 12/14/2015  . Lower limb pain, anterior   . Calf pain   . Adrenal insufficiency ([place])   . Acute on chronic diastolic CHF (congestive heart failure) ([place])   . Arterial hypotension   . SOB (shortness of breath)   . Lupus (systemic lupus erythematosus) ([place])   . Congenital heart block   . Chronic diastolic congestive heart failure ([place])   . Hypotension 07/08/2015  . Fatigue 06/05/2015  . Nausea with vomiting 06/05/2015  . Hyperlipidemia associated with type 2 diabetes mellitus ([place]) 05/04/2015  . Polyarthralgia 04/26/2015  . Rhinosinusitis 04/19/2015  . Screening for cervical cancer 01/19/2015  . Onychomycosis of right great toe 12/14/2014  . Discoloration of skin 12/01/2014  . Lip abscess 11/02/2014  . CHF (congestive heart failure) ([place])   . Diabetes mellitus type 2 in obese ([place])   . Hyperglycemia 10/24/2014  . Dehydration   . Sinus tachycardia ([place])   . Chronic respiratory failure with hypoxia ([place]) 08/07/2014  .  Atrial tachycardia ([place]) 07/26/2014  . Insertion of implantable subdermal contraceptive 07/12/2014  . ILD (interstitial lung disease) ([place]) 06/19/2014  . Pulmonary infiltrates 06/17/2014  . Dyspnea 06/16/2014  . Hypoxemia 06/16/2014  . Congestive dilated cardiomyopathy ([place]) 05/31/2014  . Nonallergic rhinitis 05/22/2014  . Vaginal itching 05/22/2014  . Shortness of breath 05/08/2014  . Sterilization consult 04/20/2014  . Breast tenderness 01/18/2014  . Carpal tunnel syndrome 09/10/2012  . Constipation 03/03/2012  . Chronic systolic heart failure ([place]) 02/26/2012  . Chest pain, unspecified 12/29/2011  . Post-nasal drip 10/24/2011  . Contraception 01/29/2011  . Heart palpitations   . PPM-Medtronic   . Congenital complete AV block   . Bipolar affective disorder ([place])   . Obesity   . GERD (gastroesophageal reflux disease)   . Hypertension 01/05/2011  . THYROGLOSSAL DUCT CYST 07/18/2010  . UNSPECIFIED CARDIAC DYSRHYTHMIA 05/07/2010  . RH FACTOR, NEGATIVE 04/08/2010  . Mild intermittent asthma without complication 25/49/8264  . 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 HEART BLOCK 12/17/2006  . IRRITABLE BOWEL SYNDROME 12/17/2006    Past Medical History: Past Medical History  Diagnosis Date  . Cardiac pacemaker     a. Since age 35 in 43. b. Upgrade to BiV in 2013.  . Congenital complete AV block   .  Obesity   . GERD (gastroesophageal reflux disease)   . Asymptomatic LV dysfunction     a. Echo in Dec 2011 with EF 35 to 40%. Felt to be due to paced rhythm. b. EF 25-30% in 07/2014.  . Seizures ([place])     as a child- from high fever  . Anxiety   . Bipolar affective disorder ([place])   . Depression     bipolar  . Carpal tunnel syndrome of right wrist   . Asthma     seasonal allergies   . Arthritis     rheumatoid arthritis- mild, no rheumatology care   . Hypertension   . Pneumonitis     a. a/w hypoxia - inflammatory - large workup 07/2014.  [name] Sinus tachycardia ([place])   . Diabetes  mellitus without complication ([place])   . Presence of permanent cardiac pacemaker   . CHF (congestive heart failure) ([place])   . Lupus ([place])   . Lupus (systemic lupus erythematosus) ([place])     Past Surgical History: Past Surgical History  Procedure Laterality Date  . Throat surgery  1994    s/p laser treatment  . Cesarean section    . Insert / replace / remove pacemaker      2001  . Cholecystectomy    . Iud removal  11/03/2011    Procedure: INTRAUTERINE DEVICE (IUD) REMOVAL;  Surgeon: [name], MD;  Location: [facility];  Service: Gynecology;  Laterality: N/A;  . Cyst excision  12/10/2012    THYROID  . Thyroglossal duct cyst N/A 12/10/2012    Procedure: REVISION OF THYROGLOSSAL DUCT CYST EXCISION;  Surgeon: [name], MD;  Location: [facility];  Service: ENT;  Laterality: N/A;  Revision of Thyroglossal Duct Cyst Excision  . Nasal fracture surgery      /w plate   . Carpal tunnel with cubital tunnel Right 07/26/2013    Procedure: RIGHT LIMITED OPEN CARPAL TUNNEL RELEASE ,  RIGHT CUBITAL TUNNEL RELEASE, INSITU VERSES ULNAR NERVE DECOMPRESSION AND ANTERIOR TRANSPOSITION;  Surgeon: [name], MD;  Location: [facility];  Service: Orthopedics;  Laterality: Right;  . Video bronchoscopy Bilateral 06/19/2014    Procedure: VIDEO BRONCHOSCOPY WITHOUT FLUORO;  Surgeon: Brand Males, MD;  Location: [facility];  Service: Cardiopulmonary;  Laterality: Bilateral;  . Bi-ventricular pacemaker upgrade N/A 03/08/2012    Procedure: BI-VENTRICULAR PACEMAKER UPGRADE;  Surgeon: [name], MD;  Location[facility] C[facility] LAB;  Service: Cardiovascular;  Laterality: N/A;  . Atrial tach ablation N/A 08/14/2014    Procedure: ATRIAL TACH ABLATION;  Surgeon: [name], MD;  Location: [facility] CATH LAB;  Service: Cardiovascular;  Laterality: N/A;  . Right heart catheterization N/A 10/26/2014    Procedure: RIGHT HEART CATH;  Surgeon: [name], MD;  Location[facility] CATH LAB;  Service: Cardiovascular;  Laterality: N/A;     Social History: Social History  Substance Use Topics  . Smoking status: Former Smoker -- 0.25 packs/day for .5 years    Types: Cigarettes    Quit date: 07/25/1996  . Smokeless tobacco: Never Used  . Alcohol Use: No   Additional social history: None   Please also refer to relevant sections of EMR.  Family History: Family History  Problem Relation Age of Onset  . Heart disease Mother     CHF (no details)  . Hypertension Mother   . Heart disease Father     Murmur  . Heart disease Sister 81     No details.  History of a pacemaker  .  Lupus Mother    Allergies and Medications: Allergies  Allergen Reactions  . Sertraline Hcl Hives  . Tape Other (See Comments)    Burns skin   No current facility-administered medications on file prior to encounter.   Current Outpatient Prescriptions on File Prior to Encounter  Medication Sig Dispense Refill  . albuterol (PROVENTIL HFA;VENTOLIN HFA) 108 (90 BASE) MCG/ACT inhaler Inhale 2 puffs into the lungs every 4 (four) hours as needed for wheezing or shortness of breath. 1 each 1  . atorvastatin (LIPITOR) 40 MG tablet Take 1 tablet (40 mg total) by mouth daily. 90 tablet 3  . bisoprolol (ZEBETA) 5 MG tablet Take 0.5 tablets (2.5 mg total) by mouth daily. 15 tablet 6  . Blood Glucose Monitoring Suppl (ONE TOUCH ULTRA 2) w/Device KIT Check blood sugar before meals and at bedtim 1 each 0  . calcium-vitamin D (OSCAL WITH D) 500-200 MG-UNIT per tablet Take 1 tablet by mouth daily with breakfast.     . camphor-menthol (SARNA) lotion Apply topically 2 (two) times daily. (Patient taking differently: Apply 1 application topically 2 (two) times daily as needed for itching. ) 222 mL 0  . etonogestrel (NEXPLANON) 68 MG IMPL implant 1 each by Subdermal route once.    . famotidine (PEPCID) 20 MG tablet Take 1 tablet (20 mg total) by mouth 2 (two) times daily. 60 tablet 1  . fluocinonide (LIDEX) 0.05 % external solution 1 APPLICATION APPLY ON THE SKIN AS  DIRECTED APPLY TO AFFECTED AREAS DAILY ON SCALP AS NEEDED FOR ITCHING.  5  . furosemide (LASIX) 20 MG tablet Take 20 mg by mouth daily.  5  . glucose blood (ONE TOUCH ULTRA TEST) test strip Check blood sugar daily before breakfast 100 each 0  . losartan (COZAAR) 25 MG tablet Take 0.5 tablets (12.5 mg total) by mouth daily. 15 tablet 6  . metFORMIN (GLUCOPHAGE) 1000 MG tablet TAKE 1 TABLET BY MOUTH TWICE A DAY WITH A MEAL 30 tablet 11  . mycophenolate (CELLCEPT) 500 MG tablet Take 500 mg by mouth 2 (two) times daily.    . polyethylene glycol (MIRALAX / GLYCOLAX) packet Take 17 g by mouth daily after supper. (Patient taking differently: Take 17 g by mouth daily as needed for mild constipation. ) 14 each 0  . senna-docusate (SENOKOT-S) 8.6-50 MG tablet Take 3 tablets by mouth 2 (two) times daily. (Patient taking differently: Take 3 tablets by mouth 2 (two) times daily as needed for mild constipation. ) 100 tablet 0  . budesonide-formoterol (SYMBICORT) 80-4.5 MCG/ACT inhaler Inhale 2 puffs into the lungs 2 (two) times daily as needed (for shortness of breath). 1 Inhaler 6  . fluticasone (FLONASE) 50 MCG/ACT nasal spray Place 2 sprays into both nostrils daily. 16 g 6  . loratadine (CLARITIN) 10 MG tablet Take 1 tablet (10 mg total) by mouth daily. (Patient taking differently: Take 10 mg by mouth daily as needed. ) 30 tablet 11  . predniSONE (DELTASONE) 20 MG tablet From 3/9-3/10: Take 30 mg in am and 20 mg in pm From 3/11-3/14: Take 20 mg in am and 20 mg in pm From 3/15- 3/18 Take 20 mg in am and 10 mg in pm From 3/19-3/22 Take 10 mg in am and 10 mg in pm From 3/22 on Take 10 mg daily (Patient not taking: Reported on 02/16/2016) 50 tablet 0    Objective: BP 86/54 mmHg  Pulse 96  Temp(Src) 98.2 F (36.8 C) (Oral)  Resp 39  Ht 5'  5" (1.651 m)  Wt 158 lb 4.6 oz (71.8 kg)  BMI 26.34 kg/m2  SpO2 100% Exam: General: lying in bed, NAD, non-toxic appearing  Eyes: EOMI. PERRL.  ENTM: Oropharynx  clear. Poor dentition.  Neck: Full ROM.  Cardiovascular: RRR. No murmur appreciated.  Respiratory: CTAB. Mildly increased WOB.  Abdomen: +BS, soft, ND, mildly TTP in suprapubic area  MSK: Limited ROM of LE (?poor effort)  Skin: Warm and dry.  Neuro: CN II-XII grossly intact. Sensation in all extremities intact. Strength 5/5 in UE. Strength 3+ to 4/5 in LE bilaterally. Alert and oriented.  Psych: Flat affect. Answers questions and follows commands.   Labs and Imaging: CBC BMET   Recent Labs Lab 02/16/16 0012  WBC 8.0  HGB 10.9*  HCT 35.5*  PLT 345    Recent Labs Lab 02/16/16 0012  NA 137  K 4.2  CL 102  CO2 24  BUN 10  CREATININE 0.53  GLUCOSE 125*  CALCIUM 7.9*     Dg Chest 2 View  02/16/2016  CLINICAL DATA:  Generalized body aches, cough and fever for 1 week. History of hypertension, diabetes, lupus. EXAM: CHEST  2 VIEW COMPARISON:  Chest radiograph December 19, 2015 FINDINGS: Cardiomediastinal silhouette is normal for this low inspiratory portable examination with crowded vascular markings. Pulmonary vascular congestion without pleural effusion or focal consolidation. Mild interstitial prominence. LEFT cardiac pacemaker in situ. No pneumothorax. Soft tissue planes and included osseous structures are nonsuspicious. Surgical clips in the included right abdomen compatible with cholecystectomy. IMPRESSION: Pulmonary vascular congestion and mild interstitial prominence suggesting early pulmonary edema without focal consolidation. Electronically Signed   By: Elon Alas M.D.   On: 02/16/2016 01:22    Nicolette Bang, DO 02/16/2016, 10:00 AM PGY-1, [place] Intern pager: [phone], text pages welcome

## 2016-02-16 NOTE — ED Provider Notes (Signed)
CSN: [id number]     Arrival date & time 02/15/16  2332 History  By signing my name below, I, Makayla Vasquez, attest that this documentation has been prepared under the direction and in the presence of Makayla [name], MD. Electronically Signed: Doran Vasquez, ED Scribe. 02/16/2016. 12:12 AM.   Chief Complaint  Patient presents with  . Generalized Body Aches   The history is provided by the patient. No language interpreter was used.   HPI Comments: Makayla Vasquez is a 35 y.o. female who presents to the Emergency Department with a PMHx of Lupus, CHF, DM, and HTN, complaining of generalized body aches that began 2-3 days ago. Pt also reports fever, HA, diarrhea, SOB and cough. She states she is more comfortable when lying on her left side. Pt denies any vomiting, hemoptysis, or any other symptoms at this time. Pt is on oxygen at home as needed.  She reports she is worsening Nothing improves her symptoms  Past Medical History  Diagnosis Date  . Cardiac pacemaker     a. Since age 76 in 26. b. Upgrade to BiV in 2013.  . Congenital complete AV block   . Obesity   . GERD (gastroesophageal reflux disease)   . Asymptomatic LV dysfunction     a. Echo in Dec 2011 with EF 35 to 40%. Felt to be due to paced rhythm. b. EF 25-30% in 07/2014.  . Seizures ([place])     as a child- from high fever  . Anxiety   . Bipolar affective disorder ([place])   . Depression     bipolar  . Carpal tunnel syndrome of right wrist   . Asthma     seasonal allergies   . Arthritis     rheumatoid arthritis- mild, no rheumatology care   . Hypertension   . Pneumonitis     a. a/w hypoxia - inflammatory - large workup 07/2014.  [name] Sinus tachycardia ([place])   . Diabetes mellitus without complication ([place])   . Presence of permanent cardiac pacemaker   . CHF (congestive heart failure) ([place])   . Lupus ([place])   . Lupus (systemic lupus erythematosus) ([place])    Past Surgical History  Procedure Laterality Date  . Throat surgery   1994    s/p laser treatment  . Cesarean section    . Insert / replace / remove pacemaker      2001  . Cholecystectomy    . Iud removal  11/03/2011    Procedure: INTRAUTERINE DEVICE (IUD) REMOVAL;  Surgeon: [name], MD;  Location: [facility];  Service: Gynecology;  Laterality: N/A;  . Cyst excision  12/10/2012    THYROID  . Thyroglossal duct cyst N/A 12/10/2012    Procedure: REVISION OF THYROGLOSSAL DUCT CYST EXCISION;  Surgeon: [name], MD;  Location: [facility];  Service: ENT;  Laterality: N/A;  Revision of Thyroglossal Duct Cyst Excision  . Nasal fracture surgery      /w plate   . Carpal tunnel with cubital tunnel Right 07/26/2013    Procedure: RIGHT LIMITED OPEN CARPAL TUNNEL RELEASE ,  RIGHT CUBITAL TUNNEL RELEASE, INSITU VERSES ULNAR NERVE DECOMPRESSION AND ANTERIOR TRANSPOSITION;  Surgeon: [name], MD;  Location: [facility];  Service: Orthopedics;  Laterality: Right;  . Video bronchoscopy Bilateral 06/19/2014    Procedure: VIDEO BRONCHOSCOPY WITHOUT FLUORO;  Surgeon: Brand Males, MD;  Location: [facility];  Service: Cardiopulmonary;  Laterality: Bilateral;  . Bi-ventricular pacemaker upgrade N/A 03/08/2012    Procedure: BI-VENTRICULAR PACEMAKER UPGRADE;  Surgeon: [name], MD;  Location: [facility] CATH LAB;  Service: Cardiovascular;  Laterality: N/A;  . Atrial tach ablation N/A 08/14/2014    Procedure: ATRIAL TACH ABLATION;  Surgeon: [name], MD;  Location: [facility] CATH LAB;  Service: Cardiovascular;  Laterality: N/A;  . Right heart catheterization N/A 10/26/2014    Procedure: RIGHT HEART CATH;  Surgeon: [name], MD;  Location: [name] CATH LAB;  Service: Cardiovascular;  Laterality: N/A;   Family History  Problem Relation Age of Onset  . Heart disease Mother     CHF (no details)  . Hypertension Mother   . Heart disease Father     Murmur  . Heart disease Sister 4     No details.  History of a pacemaker  . Lupus Mother    Social History  Substance Use Topics  .  Smoking status: Former Smoker -- 0.25 packs/day for .5 years    Types: Cigarettes    Quit date: 07/25/1996  . Smokeless tobacco: Never Used  . Alcohol Use: No   OB History    Gravida Para Term Preterm AB TAB SAB Ectopic Multiple Living   1 1 1       1      Review of Systems  Constitutional: Positive for fever.  Respiratory: Positive for cough and shortness of breath.   Gastrointestinal: Positive for diarrhea. Negative for vomiting.  Musculoskeletal: Positive for myalgias.  Neurological: Positive for headaches.  All other systems reviewed and are negative.  Allergies  Sertraline hcl and Tape  Home Medications   Prior to Admission medications   Medication Sig Start Date End Date Taking? Authorizing Provider  albuterol (PROVENTIL HFA;VENTOLIN HFA) 108 (90 BASE) MCG/ACT inhaler Inhale 2 puffs into the lungs every 4 (four) hours as needed for wheezing or shortness of breath. 06/05/15   [name], DO  atorvastatin (LIPITOR) 40 MG tablet Take 1 tablet (40 mg total) by mouth daily. 05/04/15   [name], DO  bisoprolol (ZEBETA) 5 MG tablet Take 0.5 tablets (2.5 mg total) by mouth daily. 01/08/16   [name], MD  Blood Glucose Monitoring Suppl (ONE TOUCH ULTRA 2) w/Device KIT Check blood sugar before meals and at bedtim 12/27/15   [name], PA-C  budesonide-formoterol (SYMBICORT) 80-4.5 MCG/ACT inhaler Inhale 2 puffs into the lungs 2 (two) times daily as needed (for shortness of breath). 01/07/16   [name], NP  calcium-vitamin D (OSCAL WITH D) 500-200 MG-UNIT per tablet Take 1 tablet by mouth daily with breakfast.     Historical Provider, MD  camphor-menthol Timoteo Ace) lotion Apply topically 2 (two) times daily. 12/26/15   [name], PA-C  etonogestrel (NEXPLANON) 68 MG IMPL implant 1 each by Subdermal route once.    Historical Provider, MD  famotidine (PEPCID) 20 MG tablet Take 1 tablet (20 mg total) by mouth 2 (two) times daily. 12/26/15   [name],  PA-C  fluocinonide (LIDEX) 0.05 % external solution 1 APPLICATION APPLY ON THE SKIN AS DIRECTED APPLY TO AFFECTED AREAS DAILY ON SCALP AS NEEDED FOR ITCHING. 09/28/15   Historical Provider, MD  fluticasone (FLONASE) 50 MCG/ACT nasal spray Place 2 sprays into both nostrils daily. 01/07/16   [name], NP  furosemide (LASIX) 20 MG tablet Take 20 mg by mouth daily. 11/19/15   Historical Provider, MD  glucose blood (ONE TOUCH ULTRA TEST) test strip Check blood sugar daily before breakfast 10/25/14   Leone Brand, MD  loratadine (  CLARITIN) 10 MG tablet Take 1 tablet (10 mg total) by mouth daily. 01/07/16   [name], NP  losartan (COZAAR) 25 MG tablet Take 0.5 tablets (12.5 mg total) by mouth daily. 01/08/16   [name], MD  metFORMIN (GLUCOPHAGE) 1000 MG tablet TAKE 1 TABLET BY MOUTH TWICE A DAY WITH A MEAL 05/18/15   [name], DO  mycophenolate (CELLCEPT) 500 MG tablet Take 500 mg by mouth 2 (two) times daily.    Historical Provider, MD  polyethylene glycol (MIRALAX / GLYCOLAX) packet Take 17 g by mouth daily after supper. 12/26/15   [name], PA-C  predniSONE (DELTASONE) 20 MG tablet From 3/9-3/10: Take 30 mg in am and 20 mg in pm From 3/11-3/14: Take 20 mg in am and 20 mg in pm From 3/15- 3/18 Take 20 mg in am and 10 mg in pm From 3/19-3/22 Take 10 mg in am and 10 mg in pm From 3/22 on Take 10 mg daily 12/27/15   [name], PA-C  senna-docusate (SENOKOT-S) 8.6-50 MG tablet Take 3 tablets by mouth 2 (two) times daily. 12/26/15   [name], PA-C   BP 90/66 mmHg  Pulse 133  Temp(Src) 99.7 F (37.6 C) (Oral)  Resp 16  SpO2 99%   Physical Exam CONSTITUTIONAL: Well developed/well nourished HEAD: Normocephalic/atraumatic EYES: EOMI/PERRL ENMT: Mucous membranes moist NECK: supple no meningeal signs SPINE/BACK:entire spine nontender CV: S1/S2 noted, no murmurs/rubs/gallops noted LUNGS: tachypnic , decreased breath sound bilaterally.  ABDOMEN: soft, diffuse  tenderness noted GU:no cva tenderness NEURO: Pt is awake/alert/appropriate, moves all extremitiesx4.  No facial droop.   EXTREMITIES: pulses normal/equal, full ROM, diffuse tenderness noted, no deformity. SKIN: warm, color normal PSYCH: no abnormalities of mood noted, alert and oriented to situation  ED Course  Procedures  CRITICAL CARE Performed by: Sharyon Cable Total critical care time: 45 minutes Critical care time was exclusive of separately billable procedures and treating other patients. Critical care was necessary to treat or prevent imminent or life-threatening deterioration. Critical care was time spent personally by me on the following activities: development of treatment plan with patient and/or surrogate as well as nursing, discussions with consultants, evaluation of patient's response to treatment, examination of patient, obtaining history from patient or surrogate, ordering and performing treatments and interventions, ordering and review of laboratory studies, ordering and review of radiographic studies, pulse oximetry and re-evaluation of patient's condition. PATIENT WITH HYPOTENSION, SEPSIS, REQUIRED MULTIPLE IV FLUIDS BOLUSES DIAGNOSTIC STUDIES: Oxygen Saturation is 99% on room air, normal by my interpretation.    COORDINATION OF CARE: 12:06 AM Will give fluids, Maxipime and Vancocin. Will order CXR, blood work, EKG, and urinalysis. Discussed treatment plan with pt at bedside and pt agreed to plan. 1:16 AM Pt stable Talking on phone, no distress Workup pending at this time 3:49 AM Pt with sepsis, likely from UTI D/w family medicine They request critical care consult 3:50 AM i spoke to dr deterding We reviewed chart She requests giving the patient one more liter of normal saline and reassess If she continues to be hypotensive she will admit patient 4:35 AM D/w dr deterding Will send critical care team to evaluate patient She is awake/alert, no new  complaints Labs Review Labs Reviewed  COMPREHENSIVE METABOLIC PANEL - Abnormal; Notable for the following:    Glucose, Bld 125 (*)    Calcium 7.9 (*)    Albumin 2.3 (*)    AST 92 (*)    Total Bilirubin 1.9 (*)  All other components within normal limits  CBC WITH DIFFERENTIAL/PLATELET - Abnormal; Notable for the following:    Hemoglobin 10.9 (*)    HCT 35.5 (*)    MCH 25.6 (*)    RDW 18.1 (*)    All other components within normal limits  URINALYSIS, ROUTINE W REFLEX MICROSCOPIC (NOT AT [facility]) - Abnormal; Notable for the following:    Color, Urine AMBER (*)    APPearance CLOUDY (*)    Specific Gravity, Urine 1.038 (*)    Hgb urine dipstick TRACE (*)    Bilirubin Urine SMALL (*)    Ketones, ur 15 (*)    Protein, ur 100 (*)    Nitrite POSITIVE (*)    Leukocytes, UA TRACE (*)    All other components within normal limits  URINE MICROSCOPIC-ADD ON - Abnormal; Notable for the following:    Squamous Epithelial / LPF 6-30 (*)    Bacteria, UA MANY (*)    All other components within normal limits  CULTURE, BLOOD (ROUTINE X 2)  CULTURE, BLOOD (ROUTINE X 2)  URINE CULTURE  I-STAT BETA HCG BLOOD, ED ([facility], [facility], [facility] ONLY)  I-STAT CG4 LACTIC ACID, ED  I-STAT CG4 LACTIC ACID, ED    Imaging Review Dg Chest 2 View  02/16/2016  CLINICAL DATA:  Generalized body aches, cough and fever for 1 week. History of hypertension, diabetes, lupus. EXAM: CHEST  2 VIEW COMPARISON:  Chest radiograph December 19, 2015 FINDINGS: Cardiomediastinal silhouette is normal for this low inspiratory portable examination with crowded vascular markings. Pulmonary vascular congestion without pleural effusion or focal consolidation. Mild interstitial prominence. LEFT cardiac pacemaker in situ. No pneumothorax. Soft tissue planes and included osseous structures are nonsuspicious. Surgical clips in the included right abdomen compatible with cholecystectomy. IMPRESSION: Pulmonary vascular congestion and mild interstitial prominence  suggesting early pulmonary edema without focal consolidation. Electronically Signed   By: Elon Alas M.D.   On: 02/16/2016 01:22   I have personally reviewed and evaluated these images and lab results as part of my medical decision-making.   EKG Interpretation   Date/Time:  Saturday February 16 2016 00:22:44 EDT Ventricular Rate:  129 PR Interval:  111 QRS Duration: 110 QT Interval:  345 QTC Calculation: 505 R Axis:   -145 Text Interpretation:  Atrial-sensed ventricular-paced rhythm No further  analysis attempted due to paced rhythm No significant change since last  tracing Confirmed by Christy Gentles  MD, [place] ([id number]) on 02/16/2016 12:52:57 AM     Medications  vancomycin (VANCOCIN) IVPB 750 mg/150 ml premix (not administered)  ceFEPIme (MAXIPIME) 1 g in dextrose 5 % 50 mL IVPB (not administered)  sodium chloride 0.9 % bolus 1,000 mL (0 mLs Intravenous Stopped 02/16/16 0214)  ceFEPIme (MAXIPIME) 2 g in dextrose 5 % 50 mL IVPB (0 g Intravenous Stopped 02/16/16 0108)  vancomycin (VANCOCIN) IVPB 1000 mg/200 mL premix (0 mg Intravenous Stopped 02/16/16 0138)  fentaNYL (SUBLIMAZE) injection 50 mcg (50 mcg Intravenous Given 02/16/16 0117)  sodium chloride 0.9 % bolus 1,000 mL (1,000 mLs Intravenous New Bag/Given 02/16/16 0354)   Sepsis - Repeat Assessment  Performed at:    9166  Vitals     Blood pressure 92/49, pulse 102, temperature 99.4 F (37.4 C), temperature source Oral, resp. rate 41, height 5' 5"  (1.651 m), weight 68.04 kg, SpO2 98 %.  Heart:     Tachycardic  Lungs:    Rales  Capillary Refill:   <2 sec  Peripheral Pulse:   Radial pulse palpable  Skin:  Normal Color    MDM   Final diagnoses:  UTI (lower urinary tract infection)  Sepsis, due to unspecified organism [facility])    Nursing notes including past medical history and social history reviewed and considered in documentation xrays/imaging reviewed by myself and considered during evaluation Labs/vital reviewed  myself and considered during evaluation   I personally performed the services described in this documentation, which was scribed in my presence. The recorded information has been reviewed and is accurate.       Makayla [name], MD 02/16/16 [phone]

## 2016-02-16 NOTE — Progress Notes (Addendum)
Pharmacy Antibiotic Note  Makayla Vasquez is a 35 y.o. female with PMH of lupus, CHF, DM, HTN who presents to [facility] ED on 02/15/2016 with generalized body aches, fever, SOB, cough, HA, and diarrhea. Pharmacy has been consulted for Vancomycin and Cefepime dosing for pneumonia.  Plan: Vancomycin 1g IV x 1 given in ED. Continue with Vancomycin 750mg  IV q8h. Plan for Vancomycin trough level at steady state. Goal trough level 15-20 mcg/mL.  Cefepime 2g IV x 1 given in ED. Continue with Cefepime 1g IV q8h. Monitor renal function, cultures, clinical course.  Height: 5\' 5"  (165.1 cm) Weight: 150 lb (68.04 kg) IBW/kg (Calculated) : 57  Temp (24hrs), Avg:99.9 F (37.7 C), Min:99.7 F (37.6 C), Max:100 F (37.8 C)   Recent Labs Lab 02/16/16 0012 02/16/16 0032  WBC 8.0  --   LATICACIDVEN  --  1.78    CrCl cannot be calculated (Patient has no serum creatinine result on file.).    Allergies  Allergen Reactions  . Sertraline Hcl Hives  . Tape Other (See Comments)    Burns skin    Antimicrobials this admission: 4/29 >> Cefepime >> 4/29 >> Vancomycin >>  Dose adjustments this admission: --  Microbiology results: 4/29 BCx: sent 4/29 UCx: ordered  Thank you for allowing pharmacy to be a part of this patient's care.  5/29, PharmD, BCPS Pager: [phone] 02/16/2016 12:59 AM

## 2016-02-17 DIAGNOSIS — M329 Systemic lupus erythematosus, unspecified: Secondary | ICD-10-CM

## 2016-02-17 DIAGNOSIS — F3175 Bipolar disorder, in partial remission, most recent episode depressed: Secondary | ICD-10-CM

## 2016-02-17 LAB — CBC
HCT: 29.8 % — ABNORMAL LOW (ref 36.0–46.0)
HEMOGLOBIN: 9.2 g/dL — AB (ref 12.0–15.0)
MCH: 26.2 pg (ref 26.0–34.0)
MCHC: 30.9 g/dL (ref 30.0–36.0)
MCV: 84.9 fL (ref 78.0–100.0)
Platelets: 273 10*3/uL (ref 150–400)
RBC: 3.51 MIL/uL — ABNORMAL LOW (ref 3.87–5.11)
RDW: 17.5 % — AB (ref 11.5–15.5)
WBC: 4.4 10*3/uL (ref 4.0–10.5)

## 2016-02-17 LAB — CK
CK TOTAL: 1705 U/L — AB (ref 38–234)
CK TOTAL: 1214 U/L — AB (ref 38–234)

## 2016-02-17 LAB — COMPREHENSIVE METABOLIC PANEL
ALBUMIN: 1.6 g/dL — AB (ref 3.5–5.0)
ALK PHOS: 42 U/L (ref 38–126)
ALT: 20 U/L (ref 14–54)
ANION GAP: 8 (ref 5–15)
AST: 40 U/L (ref 15–41)
BUN: 8 mg/dL (ref 6–20)
CALCIUM: 8.1 mg/dL — AB (ref 8.9–10.3)
CO2: 24 mmol/L (ref 22–32)
CREATININE: 0.53 mg/dL (ref 0.44–1.00)
Chloride: 106 mmol/L (ref 101–111)
GFR calc Af Amer: >60 mL/min (ref >60)
GFR calc non Af Amer: >60 mL/min (ref >60)
GLUCOSE: 170 mg/dL — AB (ref 65–99)
Potassium: 3.1 mmol/L — ABNORMAL LOW (ref 3.5–5.1)
SODIUM: 138 mmol/L (ref 135–145)
Total Bilirubin: 0.8 mg/dL (ref 0.3–1.2)
Total Protein: 5.5 g/dL — ABNORMAL LOW (ref 6.5–8.1)

## 2016-02-17 LAB — RAPID URINE DRUG SCREEN, HOSP PERFORMED
AMPHETAMINES: NOT DETECTED
Barbiturates: NOT DETECTED
Benzodiazepines: NOT DETECTED
Cocaine: NOT DETECTED
Opiates: NOT DETECTED
Tetrahydrocannabinol: NOT DETECTED

## 2016-02-17 LAB — GLUCOSE, CAPILLARY
GLUCOSE-CAPILLARY: 136 mg/dL — AB (ref 65–99)
Glucose-Capillary: 185 mg/dL — ABNORMAL HIGH (ref 65–99)

## 2016-02-17 LAB — VANCOMYCIN, TROUGH: VANCOMYCIN TR: 19 ug/mL (ref 10.0–20.0)

## 2016-02-17 LAB — URINE CULTURE: Culture: NO GROWTH

## 2016-02-17 LAB — TROPONIN I: Troponin I: <0.03 ng/mL (ref <0.031)

## 2016-02-17 LAB — RHEUMATOID FACTOR: Rhuematoid fact SerPl-aCnc: <10 IU/mL (ref 0.0–13.9)

## 2016-02-17 MED ORDER — POTASSIUM CHLORIDE CRYS ER 20 MEQ PO TBCR
40.0000 meq | EXTENDED_RELEASE_TABLET | Freq: Three times a day (TID) | ORAL | Status: AC
  Administered 2016-02-17 (×3): 40 meq via ORAL
  Filled 2016-02-17 (×3): qty 2

## 2016-02-17 NOTE — Progress Notes (Signed)
Pt admitted to floor at 17:15 fr 3S. VS per flowsheet. Pt oriented to room/call bell. Pt belongins in closet. Pt keeps wallet under pillow and RN advised her not to but pt states "there is nothing in it anyway".

## 2016-02-17 NOTE — Progress Notes (Signed)
Family Medicine Teaching Service Daily Progress Note Intern Pager: [phone]  Patient name: Makayla Vasquez Medical record number: [id number] Date of birth: 1981/05/04 Age: 35 y.o. Gender: female  Primary Care Provider: Nobie Putnam, DO Consultants: CCM Code Status: Full  Pt Overview and Major Events to Date:  4/29: Admitted with concerns for sepsis w/ hypotension, but clinically stable and lactic acid wnl  Assessment and Plan: Makayla Vasquez is a 35 y.o. female presenting with weakness, cough, and myalgias. Concern for sepsis. PMH is significant for HFrEF (EF 40-45%), T2DM, congenital AV block with pace maker, and interstitial lung disease on chronic prednisone and immunosuppressive therapy.   Sepsis: Patient met SIRs criteria with tachycardia and tachypnea on admit. Qsofa score of 2. Patient with hypotension after appropriate volume resuscitation. Lactic acid normal and no other evidence of end-organ damage. From reviewing prior notes, patient appears to have documented hypotension at baseline. UA concerning for infectious process positive nitrites, trace leukocytes, 6-30 WBC, and many bacteria; however dirty catch with 6-30 squamous epithelial cells. Patient does have a history of severe pyelonephritis. Potential pulmonary infectious process given new onset cough and need for continuous home O2. CXR without focal consolidation. Patient is afebrile and without leukocytosis. Broad spectrum antibiotics initiated in ED - Abx: Vanc/Zosyn per pharm 4/29>> - s/p Hydrocortisone 100 mg IV x1, then 30 mg PO BID   Elevated CK/Myalgias/LE Weakness: Patient with h/o steroid induced myopathy. Also concern for potential viral myositis vs statin induced. Patient with h/o elevated TSH at previous hospitalization so potential for hypothyroid myopathy. Troponin Negative; Influenza panel: Negative. TSH wnl. Patient does have h/o of presenting with similar complaints. Did have stay in CIR recently for  similar presentation.  -hold home Lipitor  - check HIV - Trend CK: 8185 (4/29) > 1705 (4/30) - PT/OT consults  - Steroids as above  Hypokalemia - monitor and replace  HFrEF (EF40-45%): CXR with signs of possible pulmonary edema although lung exam is unremarkable for crackles. No clinical signs of fluid overload.  - Daily weights and strict I/O - holding home Bisprolol, Cozaar, and Lasix in the setting of hypotension   Interstitial Lung Disease, on chronic prednisone and immunosuppressive therapy: On 4L O2 at home  - supplemental O2, to keep O2 > 92% - continue home Cellcept 500 mg BID, per Pulm after acute infection this should be titrated up as outpatient to wean off prednisone  -diuresis when BP will tolerate after sepsis resolves  - cont Dulera BID and Albuterol PRN  -recommended open lung biopsy when acute infection resolves   T2DM: From chronic steroid use. CBG 135 on admission. At rehab discharge in March, patient was instructed to d/c insulin use as had episodes of hypoglycemia as steroid dose was lowered.  - heart healthy carb modified diet - hold home metformin  - sensitive SSI  - CBGs AC/HS  - anticipate CBGs to rise with stress dose steroids   Congenital AVN Block s/p atrial pacer (placed at age 42yo):  - on telemetry   FEN/GI: Heart Healthy/Carb Modified Diet, SLIV Prophylaxis: Lovenox   Disposition: Stepdown  Subjective:  Continue to report full body pain greatest in legs, but says this is improving. Denies CP, SOB, fevers. Has not gotten out of bed due to pain.   Objective: Temp:  [97.4 F (36.3 C)-98.5 F (36.9 C)] 97.6 F (36.4 C) (04/30 0831) Pulse Rate:  [69-88] 76 (04/30 0831) Resp:  [27-44] 27 (04/30 0831) BP: (87-97)/(53-63) 87/55 mmHg (04/30 0831) SpO2:  [  95 %-100 %] 95 % (04/30 0900) Weight:  [160 lb 0.9 oz (72.6 kg)] 160 lb 0.9 oz (72.6 kg) (04/30 0358)  Physical Exam: General: lying in bed, NAD, non-toxic appearing  Eyes:  EOMI. PERRL.  ENTM: Oropharynx clear. Neck: Full ROM.  Cardiovascular: RRR. No m/r/g Respiratory: CTAB. Normal WOB Abdomen: +BS, soft, NDNT MSK: Limited ROM of LE (?poor effort); No swelling or erythema Skin: Warm and dry.  Neuro: CN II-XII grossly intact. Sensation in all extremities intact. Strength 5/5 in UE. Strength 3+ to 4/5 in LE bilaterally, but improves with distraction. Alert and oriented.  Psych: Flat affect. Answers questions and follows commands.   Laboratory:  Recent Labs Lab 02/16/16 0012 02/16/16 1149 02/17/16 0631  WBC 8.0 6.1 4.4  HGB 10.9* 9.1* 9.2*  HCT 35.5* 30.2* 29.8*  PLT 345 255 273    Recent Labs Lab 02/16/16 0012 02/16/16 1149 02/17/16 0631  NA 137  --  138  K 4.2  --  3.1*  CL 102  --  106  CO2 24  --  24  BUN 10  --  8  CREATININE 0.53 0.46 0.53  CALCIUM 7.9*  --  8.1*  PROT 7.0  --  5.5*  BILITOT 1.9*  --  0.8  ALKPHOS 59  --  42  ALT 26  --  20  AST 92*  --  40  GLUCOSE 125*  --  170*    Imaging/Diagnostic Tests: Dg Chest 2 View  02/16/2016  CLINICAL DATA:  Generalized body aches, cough and fever for 1 week. History of hypertension, diabetes, lupus. EXAM: CHEST  2 VIEW COMPARISON:  Chest radiograph December 19, 2015 FINDINGS: Cardiomediastinal silhouette is normal for this low inspiratory portable examination with crowded vascular markings. Pulmonary vascular congestion without pleural effusion or focal consolidation. Mild interstitial prominence. LEFT cardiac pacemaker in situ. No pneumothorax. Soft tissue planes and included osseous structures are nonsuspicious. Surgical clips in the included right abdomen compatible with cholecystectomy. IMPRESSION: Pulmonary vascular congestion and mild interstitial prominence suggesting early pulmonary edema without focal consolidation. Electronically Signed   By: Elon Alas M.D.   On: 02/16/2016 01:22   Olam Idler, MD 02/17/2016, 10:26 AM PGY-3, [place] Intern  pager: [phone], text pages welcome

## 2016-02-17 NOTE — Evaluation (Signed)
Physical Therapy Evaluation Patient Details Name: Makayla Vasquez MRN: [id number] DOB: 1981/07/19 Today's Date: 02/17/2016   History of Present Illness  Patient is a 35 yo female admitted 02/15/16 with weakness, cough, and myalgias. Concern for sepsis. PMH is significant for HFrEF (EF 40-45%), T2DM, congenital AV block with pace maker, and interstitial lung disease on chronic prednisone and immunosuppressive therapy.     Clinical Impression  Patient presents with problems listed below.  Will benefit from acute PT to maximize functional mobility prior to return home with family.  Patient limited today by significant weakness, and back/LE pain.  Patient receiving HHPT pta.  Recommend these services continue at d/c.    Follow Up Recommendations Home health PT;Supervision for mobility/OOB    Equipment Recommendations  None recommended by PT    Recommendations for Other Services       Precautions / Restrictions Precautions Precautions: Fall Restrictions Weight Bearing Restrictions: No      Mobility  Bed Mobility Overal bed mobility: Needs Assistance Bed Mobility: Supine to Sit;Sit to Supine     Supine to sit: Min assist Sit to supine: Min assist   General bed mobility comments: Assist to bring trunk to sitting position.  Assist to bring LE's onto bed to return to supine.  Transfers Overall transfer level: Needs assistance Equipment used: None (Pushing IV pole) Transfers: Sit to/from Stand Sit to Stand: Mod assist         General transfer comment: Verbal cues to scoot to edge of bed.  Mod assist to power up to standing from bed and toilet.  Assist in stance initially to steady.  Ambulation/Gait Ambulation/Gait assistance: Min assist Ambulation Distance (Feet): 30 Feet Assistive device: None (pushing IV pole) Gait Pattern/deviations: Step-through pattern;Decreased stride length;Shuffle;Drifts right/left Gait velocity: decreased Gait velocity interpretation: Below normal  speed for age/gender General Gait Details: Patient with slow, unsteady gait.  UE support on IV pole for balance.  Fatigues quickly.  LE's unstable, buckling during gait.  HR increased to 127 with gait.  O2 sats remained in 90's.  Stairs            Wheelchair Mobility    Modified Rankin (Stroke Patients Only)       Balance Overall balance assessment: Needs assistance Sitting-balance support: Single extremity supported;Feet supported Sitting balance-Leahy Scale: Fair     Standing balance support: Single extremity supported Standing balance-Leahy Scale: Poor                               Pertinent Vitals/Pain Pain Assessment: 0-10 Pain Score: 6  Pain Location: back and LE's Pain Descriptors / Indicators: Aching;Sore Pain Intervention(s): Limited activity within patient's tolerance;Monitored during session;Repositioned    Home Living Family/patient expects to be discharged to:: Private residence Living Arrangements: Spouse/significant other;Children Available Help at Discharge: Family;Available PRN/intermittently Type of Home: Apartment Home Access: Stairs to enter Entrance Stairs-Rails: Right Entrance Stairs-Number of Steps: 2 Home Layout: One level Home Equipment: Walker - 2 wheels;Shower seat;Wheelchair - manual      Prior Function Level of Independence: Independent;Needs assistance   Gait / Transfers Assistance Needed: Reports she has been walking with no assistive device.  Uses w/c at times for housekeeping.  ADL's / Homemaking Assistance Needed: Patient independent with bathing, dressing and does housekeeping from w/c. Boyfriend assists with meals and housekeeping.        Hand Dominance   Dominant Hand: Right    Extremity/Trunk Assessment  Upper Extremity Assessment: Generalized weakness           Lower Extremity Assessment: Generalized weakness         Communication   Communication: No difficulties  Cognition  Arousal/Alertness: Awake/alert Behavior During Therapy: WFL for tasks assessed/performed Overall Cognitive Status: Within Functional Limits for tasks assessed                      General Comments      Exercises        Assessment/Plan    PT Assessment Patient needs continued PT services  PT Diagnosis Difficulty walking;Generalized weakness;Acute pain   PT Problem List Decreased strength;Decreased activity tolerance;Decreased balance;Decreased mobility;Decreased knowledge of use of DME;Cardiopulmonary status limiting activity;Pain  PT Treatment Interventions DME instruction;Gait training;Functional mobility training;Therapeutic activities;Therapeutic exercise;Balance training;Patient/family education   PT Goals (Current goals can be found in the Care Plan section) Acute Rehab PT Goals Patient Stated Goal: To get stronger.  To return home PT Goal Formulation: With patient Time For Goal Achievement: 02/24/16 Potential to Achieve Goals: Good    Frequency Min 3X/week   Barriers to discharge Decreased caregiver support Has prn assist.  Boyfriend works    Co-evaluation               End of Session Equipment Utilized During Treatment: Gait belt;Oxygen Activity Tolerance: Patient limited by fatigue;Patient limited by pain Patient left: in bed;with call bell/phone within reach;with bed alarm set Nurse Communication: Mobility status         Time: 1540-1601 PT Time Calculation (min) (ACUTE ONLY): 21 min   Charges:   PT Evaluation $PT Eval High Complexity: 1 Procedure     PT G CodesVena Austria Mar 02, 2016, 4:43 PM Durenda Hurt. Renaldo Fiddler, [facility] Acute Rehab Services Pager [phone]

## 2016-02-17 NOTE — Progress Notes (Signed)
Utilization review completed.  

## 2016-02-17 NOTE — Progress Notes (Signed)
PT Cancellation Note  Patient Details Name: Makayla Vasquez MRN: [id number] DOB: 1981/05/01   Cancelled Treatment:    Reason Eval/Treat Not Completed: Fatigue/lethargy limiting ability to participate.  Patient declined PT at this time - tired and sleeping.  Will return at later time for PT evaluation.   Vena Austria 02/17/2016, 1:09 PM Durenda Hurt. Renaldo Fiddler, [facility] Acute Rehab Services Pager [phone]

## 2016-02-18 DIAGNOSIS — Z950 Presence of cardiac pacemaker: Secondary | ICD-10-CM

## 2016-02-18 DIAGNOSIS — I10 Essential (primary) hypertension: Secondary | ICD-10-CM

## 2016-02-18 DIAGNOSIS — E2740 Unspecified adrenocortical insufficiency: Secondary | ICD-10-CM

## 2016-02-18 DIAGNOSIS — I5022 Chronic systolic (congestive) heart failure: Secondary | ICD-10-CM

## 2016-02-18 DIAGNOSIS — D638 Anemia in other chronic diseases classified elsewhere: Secondary | ICD-10-CM

## 2016-02-18 DIAGNOSIS — I459 Conduction disorder, unspecified: Secondary | ICD-10-CM

## 2016-02-18 LAB — RESPIRATORY VIRUS PANEL
ADENOVIRUS: NEGATIVE
INFLUENZA A: NEGATIVE
INFLUENZA B 1: NEGATIVE
Metapneumovirus: NEGATIVE
PARAINFLUENZA 1 A: NEGATIVE
PARAINFLUENZA 2 A: NEGATIVE
Parainfluenza 3: NEGATIVE
RESPIRATORY SYNCYTIAL VIRUS B: NEGATIVE
RHINOVIRUS: NEGATIVE
Respiratory Syncytial Virus A: NEGATIVE

## 2016-02-18 LAB — GLUCOSE, CAPILLARY
GLUCOSE-CAPILLARY: 286 mg/dL — AB (ref 65–99)
GLUCOSE-CAPILLARY: 146 mg/dL — AB (ref 65–99)
Glucose-Capillary: 137 mg/dL — ABNORMAL HIGH (ref 65–99)
Glucose-Capillary: 172 mg/dL — ABNORMAL HIGH (ref 65–99)

## 2016-02-18 LAB — BASIC METABOLIC PANEL WITH GFR
Anion gap: 8 (ref 5–15)
BUN: 10 mg/dL (ref 6–20)
CO2: 22 mmol/L (ref 22–32)
Calcium: 8.2 mg/dL — ABNORMAL LOW (ref 8.9–10.3)
Chloride: 111 mmol/L (ref 101–111)
Creatinine, Ser: 0.7 mg/dL (ref 0.44–1.00)
GFR calc Af Amer: >60 mL/min
GFR calc non Af Amer: >60 mL/min
Glucose, Bld: 248 mg/dL — ABNORMAL HIGH (ref 65–99)
Potassium: 4.6 mmol/L (ref 3.5–5.1)
Sodium: 141 mmol/L (ref 135–145)

## 2016-02-18 LAB — ANTINUCLEAR ANTIBODIES, IFA: ANA Ab, IFA: POSITIVE — AB

## 2016-02-18 LAB — FANA STAINING PATTERNS: Speckled Pattern: 1:1280 {titer}

## 2016-02-18 LAB — HIV ANTIBODY (ROUTINE TESTING W REFLEX): HIV SCREEN 4TH GENERATION: NONREACTIVE

## 2016-02-18 LAB — ALDOLASE: Aldolase: 48.6 U/L — ABNORMAL HIGH (ref 3.3–10.3)

## 2016-02-18 LAB — MPO/PR-3 (ANCA) ANTIBODIES

## 2016-02-18 LAB — CK: Total CK: 796 U/L — ABNORMAL HIGH (ref 38–234)

## 2016-02-18 MED ORDER — PREDNISONE 20 MG PO TABS
30.0000 mg | ORAL_TABLET | Freq: Two times a day (BID) | ORAL | Status: DC
  Administered 2016-02-18 – 2016-02-20 (×4): 30 mg via ORAL
  Filled 2016-02-18 (×4): qty 1

## 2016-02-18 MED ORDER — GLUCERNA SHAKE PO LIQD
237.0000 mL | Freq: Two times a day (BID) | ORAL | Status: DC
  Administered 2016-02-18 – 2016-02-20 (×4): 237 mL via ORAL

## 2016-02-18 MED ORDER — PREDNISONE 10 MG PO TABS: 10.0000 mg | ORAL_TABLET | Freq: Two times a day (BID) | ORAL | Status: DC

## 2016-02-18 NOTE — Evaluation (Signed)
Occupational Therapy Evaluation and Discharge Patient Details Name: Makayla Vasquez MRN: [id number] DOB: 26-Sep-1981 Today's Date: 02/18/2016    History of Present Illness Patient is a 35 yo female admitted 02/15/16 with weakness, cough, and myalgias. Concern for sepsis. PMH is significant for HFrEF (EF 40-45%), T2DM, congenital AV block with pace maker, and interstitial lung disease on chronic prednisone and immunosuppressive therapy.    Clinical Impression   This 35 yo female admitted with above presents to acute OT with deficits below (see OT problem list) thus affecting her level of independence. She reports that boyfriend can help her prn and she has all needed DME at home. We will D/C from acute OT.    Follow Up Recommendations  No OT follow up    Equipment Recommendations  None recommended by OT       Precautions / Restrictions Precautions Precautions: Fall Precaution Comments: however pt does not want to use an AD Restrictions Weight Bearing Restrictions: No      Mobility Bed Mobility Overal bed mobility: Modified Independent Bed Mobility: Supine to Sit           General bed mobility comments: HOB up and pt using her arms prn to A with getting legs off of bed  Transfers Overall transfer level: Needs assistance   Transfers: Sit to/from Stand Sit to Stand: Min guard         General transfer comment: Pt not wanting to use AD. Pt minguard A to walk around room     Balance   Sitting-balance support: No upper extremity supported;Feet supported Sitting balance-Leahy Scale: Good     Standing balance support: No upper extremity supported Standing balance-Leahy Scale: Fair (can walk without AD, but cannot accept moderate challenge while standing)                              ADL Overall ADL's : Needs assistance/impaired                                       General ADL Comments: Set up/S overall with min guard A in standing--per  pt her boyfriend can be with her and A her prn for whatever she needs               Pertinent Vitals/Pain Pain Assessment: Faces Faces Pain Scale: Hurts little more Pain Location: LEs more so with movement Pain Descriptors / Indicators: Aching;Sore Pain Intervention(s): Monitored during session;Repositioned     Hand Dominance Right   Extremity/Trunk Assessment Upper Extremity Assessment Upper Extremity Assessment: Overall WFL for tasks assessed           Communication Communication Communication: No difficulties   Cognition Arousal/Alertness: Awake/alert Behavior During Therapy: WFL for tasks assessed/performed Overall Cognitive Status: Within Functional Limits for tasks assessed                                Home Living Family/patient expects to be discharged to:: Private residence Living Arrangements: Spouse/significant other;Children Available Help at Discharge: Family;Available 24 hours/day Type of Home: Apartment Home Access: Stairs to enter Entrance Stairs-Number of Steps: 2 Entrance Stairs-Rails: Right Home Layout: One level     Bathroom Shower/Tub: Chief Strategy Officer: Standard Bathroom Accessibility: Yes   Home Equipment: Environmental consultant - 2 wheels;Shower seat;Wheelchair -  manual, grab bars at toilet   Additional Comments: per pt report boyfriend can A her as needed and has been       Prior Functioning/Environment Level of Independence: Independent with assistive device(s);Needs assistance  Gait / Transfers Assistance Needed: Reports she has been walking with no assistive device.  Uses w/c at times for housekeeping. ADL's / Homemaking Assistance Needed: Patient independent with bathing, dressing and does housekeeping from w/c. Boyfriend assists with meals and housekeeping. Communication / Swallowing Assistance Needed: no difficulties      OT Diagnosis: Generalized weakness;Acute pain         OT Goals(Current goals can be  found in the care plan section) Acute Rehab OT Goals Patient Stated Goal: to go home  OT Frequency:                End of Session    Activity Tolerance: Patient tolerated treatment well Patient left: in chair;with call bell/phone within reach;with chair alarm set   Time: 4818-5631 OT Time Calculation (min): 28 min Charges:  OT General Charges $OT Visit: 1 Procedure OT Evaluation $OT Eval Moderate Complexity: 1 Procedure OT Treatments $Self Care/Home Management : 8-22 mins  Evette Georges [phone] 02/18/2016, 4:34 PM

## 2016-02-18 NOTE — Progress Notes (Signed)
Spoke with pt about needing to have another family member with small child at all times.  Pt verbalized understanding.  Educated patient about how ambulating will help her get stronger and help her get home.  Pt verbalized understanding.  No other questions or concerns at this time.  Barrie Lyme RN 02/18/2016

## 2016-02-18 NOTE — Progress Notes (Signed)
Initial Nutrition Assessment  DOCUMENTATION CODES:   Not applicable  INTERVENTION:  Provide Glucerna Shake po BID, each supplement provides 220 kcal and 10 grams of protein.  Encourage adequate PO intake.   NUTRITION DIAGNOSIS:   Increased nutrient needs related to chronic illness as evidenced by estimated needs.  GOAL:   Patient will meet greater than or equal to 90% of their needs  MONITOR:   PO intake, Weight trends, Supplement acceptance, Labs, I & O's  REASON FOR ASSESSMENT:   Malnutrition Screening Tool    ASSESSMENT:   35 y.o. female presenting with weakness, cough, and myalgias. Concern for sepsis. PMH is significant for HFrEF (EF 40-45%), T2DM, congenital AV block with pace maker, and interstitial lung disease on chronic prednisone and immunosuppressive therapy.   Pt reports appetite has been improving. She reports PTA having a decreased appetite that had been ongoing for 1 week with consumption of usually 2 meals a day. Meal completion has been 75-100%. Per Epic weight records, pt with a 12% weight loss in 5 months. Pt unknown to reason for weight loss. Pt is agreeable to nutritional supplements to aid in caloric and protein needs.   Pt with no observed significant fat or muscle mass loss.  Labs and medications reviewed.   Diet Order:  Diet heart healthy/carb modified Room service appropriate?: Yes; Fluid consistency:: Thin  Skin:  Reviewed, no issues  Last BM:  4/26  Height:   Ht Readings from Last 1 Encounters:  02/16/16 5\' 5"  (1.651 m)    Weight:   Wt Readings from Last 1 Encounters:  02/18/16 161 lb 2.5 oz (73.1 kg)    Ideal Body Weight:  56.8 kg  BMI:  Body mass index is 26.82 kg/(m^2).  Estimated Nutritional Needs:   Kcal:  1850-2050  Protein:  85-95 grams  Fluid:  1.8 - 2 L/day  EDUCATION NEEDS:   No education needs identified at this time  04/19/16, MS, RD, LDN Pager # [phone] After hours/ weekend pager # [phone]

## 2016-02-18 NOTE — Progress Notes (Signed)
Family Medicine Teaching Service Daily Progress Note Intern Pager: [phone]  Patient name: Makayla Vasquez Medical record number: [id number] Date of birth: 15-May-1981 Age: 35 y.o. Gender: female  Primary Care Provider: Nobie Putnam, DO Consultants: CCM Code Status: Full  Pt Overview and Major Events to Date:  4/29: Admitted with concerns for sepsis w/ hypotension, but clinically stable and lactic acid wnl  Assessment and Plan: Makayla Vasquez is a 35 y.o. female presenting with weakness, cough, and myalgias. Concern for sepsis. PMH is significant for HFrEF (EF 40-45%), T2DM, congenital AV block with pace maker, and interstitial lung disease on chronic prednisone and immunosuppressive therapy.   Sepsis: Patient met SIRs criteria with tachycardia and tachypnea on admit. Qsofa score of 2. Not suspecting Sepsis at this point based on patient being afebrile and non-concerning labs. Vanc/Zosyn initiated in ED but were stopped on 4/30.  - 30 mg PO BID (since 4/30) stress dose steroid. Will go back to home dose steroid (Delstasone 10 mg BID) and watch BP closely  Elevated CK/Myalgias/LE Weakness: Patient with h/o steroid induced myopathy. Also concern for potential viral myositis vs statin induced. Patient with h/o elevated TSH at previous hospitalization so potential for hypothyroid myopathy. Troponin Negative; Influenza panel: Negative. TSH wnl. Patient does have h/o of presenting with similar complaints. Did have stay in [place] recently for similar presentation.  - holding home Lipitor  - check HIV - Trend CK: 5681 (4/29) > 1705 (4/30)> will check today - PT/OT consults  - Steroids as above  Hypokalemia - monitor and replace - BMP  today  HFrEF (EF40-45%): CXR with signs of possible pulmonary edema although lung exam is unremarkable for crackles. No clinical signs of fluid overload.  - Daily weights and strict I/O - holding home Bisprolol, Cozaar, and Lasix in the setting of  hypotension   Interstitial Lung Disease, on chronic prednisone and immunosuppressive therapy: On 4L O2 at home  - supplemental O2, to keep O2 > 92% - continue home Cellcept 500 mg BID, per Pulm after acute infection this should be titrated up as outpatient to wean off prednisone  -diuresis when BP will tolerate after sepsis resolves  - cont Dulera BID and Albuterol PRN  -recommended open lung biopsy when acute infection resolves   T2DM: From chronic steroid use. At rehab discharge in March, patient was instructed to d/c insulin use as had episodes of hypoglycemia as steroid dose was lowered. CBGs 150-180's, has only had 4 units of Novolog.over last 24 hours - heart healthy carb modified diet - hold home metformin  - sensitive SSI  - CBGs AC/HS  - anticipate CBGs to rise with stress dose steroids   Congenital AVN Block s/p atrial pacer (placed at age 61yo):  - on telemetry   FEN/GI: Heart Healthy/Carb Modified Diet, SLIV Prophylaxis: Lovenox   Disposition: med-surg  Subjective:  Continue to report full body pain greatest in legs and back, but says this is improving. Denies CP, SOB, fevers. Has not had a bowel movement yet but does have a good appetite.   Objective: Temp:  [97.4 F (36.3 C)-97.8 F (36.6 C)] 97.4 F (36.3 C) (05/01 0521) Pulse Rate:  [76-97] 80 (05/01 0521) Resp:  [18-39] 18 (05/01 0521) BP: (87-107)/(55-66) 107/66 mmHg (05/01 0521) SpO2:  [95 %-100 %] 97 % (05/01 0521) Weight:  [161 lb 2.5 oz (73.1 kg)] 161 lb 2.5 oz (73.1 kg) (05/01 0521)  Physical Exam: General: lying in bed, NAD, non-toxic appearing  Eyes: EOMI. PERRL.  ENTM: Oropharynx clear. Neck: Full ROM.  Cardiovascular: RRR. No m/r/g Respiratory: CTAB. Normal WOB Abdomen: +BS, soft, NDNT MSK: 5/5 strength in bilateral lower extremities. 5/5 strength in right upper extremity, 4/5 strength is left upper extremity; No swelling or erythema, no tenderness to palpation.  Skin: Warm and  dry.  Neuro: Alert and oriented x 3 Psych: Flat affect. Answers questions and follows commands.   Laboratory:  Recent Labs Lab 02/16/16 0012 02/16/16 1149 02/17/16 0631  WBC 8.0 6.1 4.4  HGB 10.9* 9.1* 9.2*  HCT 35.5* 30.2* 29.8*  PLT 345 255 273    Recent Labs Lab 02/16/16 0012 02/16/16 1149 02/17/16 0631  NA 137  --  138  K 4.2  --  3.1*  CL 102  --  106  CO2 24  --  24  BUN 10  --  8  CREATININE 0.53 0.46 0.53  CALCIUM 7.9*  --  8.1*  PROT 7.0  --  5.5*  BILITOT 1.9*  --  0.8  ALKPHOS 59  --  42  ALT 26  --  20  AST 92*  --  40  GLUCOSE 125*  --  170*    Imaging/Diagnostic Tests: Dg Chest 2 View  02/16/2016  CLINICAL DATA:  Generalized body aches, cough and fever for 1 week. History of hypertension, diabetes, lupus. EXAM: CHEST  2 VIEW COMPARISON:  Chest radiograph December 19, 2015 FINDINGS: Cardiomediastinal silhouette is normal for this low inspiratory portable examination with crowded vascular markings. Pulmonary vascular congestion without pleural effusion or focal consolidation. Mild interstitial prominence. LEFT cardiac pacemaker in situ. No pneumothorax. Soft tissue planes and included osseous structures are nonsuspicious. Surgical clips in the included right abdomen compatible with cholecystectomy. IMPRESSION: Pulmonary vascular congestion and mild interstitial prominence suggesting early pulmonary edema without focal consolidation. Electronically Signed   By: Elon Alas M.D.   On: 02/16/2016 01:22   Carlyle Dolly, MD 02/18/2016, 7:14 AM PGY-1, [place] Intern pager: [phone], text pages welcome

## 2016-02-18 NOTE — Progress Notes (Signed)
OT Cancellation Note  Patient Details Name: PALMA BUSTER MRN: [id number] DOB: 08/27/1981   Cancelled Treatment:    Reason Eval/Treat Not Completed: Other (comment). Attempted to see patient for eval, pt stated, "it is too early, I am not awake now". Asked her if 10:00 am is good and she said 11:00 (I am out of building at 11:00 and told her I could try after lunch--she agreed).  Evette Georges 02/18/2016, 8:48 AM

## 2016-02-18 NOTE — Progress Notes (Signed)
PT Cancellation Note  Patient Details Name: Makayla Vasquez MRN: [id number] DOB: 21-Apr-1981   Cancelled Treatment:    Reason Eval/Treat Not Completed: Checked with pt about 11:45am and pt declined working with PT at this time. Hard to understand pt as she was asleep when PT arrived, however it appears that pt declined due to fatigue and pain. Will check back as schedule allows.    Conni Slipper 02/18/2016, 12:43 PM  Conni Slipper, PT, DPT Acute Rehabilitation Services Pager: [phone]

## 2016-02-19 DIAGNOSIS — I9589 Other hypotension: Secondary | ICD-10-CM

## 2016-02-19 LAB — GLUCOSE, CAPILLARY
GLUCOSE-CAPILLARY: 176 mg/dL — AB (ref 65–99)
GLUCOSE-CAPILLARY: 263 mg/dL — AB (ref 65–99)
Glucose-Capillary: 300 mg/dL — ABNORMAL HIGH (ref 65–99)
Glucose-Capillary: 337 mg/dL — ABNORMAL HIGH (ref 65–99)
Glucose-Capillary: 355 mg/dL — ABNORMAL HIGH (ref 65–99)

## 2016-02-19 MED ORDER — BISOPROLOL FUMARATE 5 MG PO TABS
2.5000 mg | ORAL_TABLET | Freq: Every day | ORAL | Status: DC
  Administered 2016-02-19 – 2016-02-20 (×2): 2.5 mg via ORAL
  Filled 2016-02-19 (×2): qty 0.5

## 2016-02-19 NOTE — Discharge Summary (Signed)
[facility] Discharge Summary  Patient name: Makayla Vasquez Medical record number: [id number] Date of birth: 02-Jun-1981 Age: 35 y.o. Gender: female Date of Admission: 02/15/2016  Date of Discharge: 02/20/2016  Admitting Physician: Alveda Reasons, MD  Primary Care Provider: Nobie Putnam, DO Consultants: None  Indication for Hospitalization: SIRS criteria   Discharge Diagnoses/Problem List:  HFrEF (EF 40-45%), T2DM congenital AV block with pace maker interstitial lung disease on chronic prednisone and immunosuppressive therapy.   Disposition: Home   Discharge Condition: stable  Discharge Exam:  General: lying in bed, NAD, does not make eye contact during conversation  Cardiovascular: Murmur, pacemaker, regular rate  Respiratory: CTAB. Normal WOB Abdomen: +BS, soft, NDNT Skin: Warm and dry.  Neuro: Alert and oriented x 3 Psych: Flat affect   Brief Hospital Course:  Makayla Vasquez is a 35 y.o. female who presented with weakness, cough, and myalgias. There was also concern for sepsis. PMH is significant for HFrEF (EF 40-45%), T2DM, congenital AV block with pace maker, and interstitial lung disease on chronic prednisone and immunosuppressive therapy.   Concerns for Sepsis:  Patient met SIRs criteria with tachycardia and tachypnea on admit. Qsofa score of 2. UA was concerning for infectious process positive nitrites, trace leukocytes, 6-30 WBC, and many bacteria; however dirty catch with 6-30 squamous epithelial cells. There was concern for pulmonary infectious process given new onset cough and need for continuous home O2 (however patient uses this chronically) although CXR without focal consolidation. Patient was afebrile and without leukocytosis. Vanc/Zosyn initiated in ED on 4/29 but was stopped on 4/30 due to low suspicion of infection. Urine cx and blood cx had no growth x 3 days.   Elevated CK/Myalgias/LE Weakness: Initial CK of 3694.  Concern for potential viral myositis vs statin induced myopathy vs steroid induced myopathy. Troponin Negative; Influenza panel: Negative. TSH wnl. Home Lipitor was held and CK trended downwards and symptoms improved. PT/OT consulted. Physical therapy recommended home health PT.   Hypotension: Lower end of normal BP throughout admission. This was also in the setting of holding all of her home antihypertensives (Bisprolol, Cozaar) and Lasix. Per chart review, does have hx of hypotension. Was given Hydrocortisone 100 mg IV x1, then 30 mg PO BID for 2 days and then placed back on home dose of Prednisone 30 mg BID. Patient was restarted on Bisoprolol a day before discharge for monitoring. Upon discharge, BP was low normal.    Interstitial Lung Disease, on chronic prednisone and immunosuppressive therapy:  On 4L O2 at home and remained on supplemental O2 at 3 L while hospitalized. Home Cellcept 500 mg BID was continued as well as Dulera BID and Albuterol PRN. Patient's steroids were dosed as above.  T2DM:  Home metformin held and patient placed on sensitive SSI.  CBGs AC/HS were monitored. Restarted on metformin at discharge   All other chronic medical conditions stable throughout admission and managed with home regimens.  Issues for Follow Up:  1. Lipitor therapy discontinued upon discharge, would not suggest resuming statin in the future as it is possible culprit for myopathy.  2.  Follow up with pulmonologist for interstitial lung disease 3. Per patient recently seen by rheumatologist whom recommended Prednisone 30 mg BID, indicated should follow up with rheumatologist regarding taper  3. Will need to follow up with cardiologist, has an appointment on 5/4    Significant Procedures: None  Significant Labs and Imaging:   Recent Labs Lab 02/16/16 0012 02/16/16 1149 02/17/16 0631  WBC 8.0 6.1 4.4  HGB 10.9* 9.1* 9.2*  HCT 35.5* 30.2* 29.8*  PLT 345 255 273    Recent Labs Lab  02/16/16 0012 02/16/16 1149 02/17/16 0631 02/18/16 0900  NA 137  --  138 141  K 4.2  --  3.1* 4.6  CL 102  --  106 111  CO2 24  --  24 22  GLUCOSE 125*  --  170* 248*  BUN 10  --  8 10  CREATININE 0.53 0.46 0.53 0.70  CALCIUM 7.9*  --  8.1* 8.2*  ALKPHOS 59  --  42  --   AST 92*  --  40  --   ALT 26  --  20  --   ALBUMIN 2.3*  --  1.6*  --      Results/Tests Pending at Time of Discharge: None   Discharge Medications:    Medication List    STOP taking these medications        atorvastatin 40 MG tablet  Commonly known as:  LIPITOR      TAKE these medications        albuterol 108 (90 Base) MCG/ACT inhaler  Commonly known as:  PROVENTIL HFA;VENTOLIN HFA  Inhale 2 puffs into the lungs every 4 (four) hours as needed for wheezing or shortness of breath.     bisoprolol 5 MG tablet  Commonly known as:  ZEBETA  Take 0.5 tablets (2.5 mg total) by mouth daily.     budesonide-formoterol 80-4.5 MCG/ACT inhaler  Commonly known as:  SYMBICORT  Inhale 2 puffs into the lungs 2 (two) times daily as needed (for shortness of breath).     calcium-vitamin D 500-200 MG-UNIT tablet  Commonly known as:  OSCAL WITH D  Take 1 tablet by mouth daily with breakfast.     camphor-menthol lotion  Commonly known as:  SARNA  Apply topically 2 (two) times daily.     famotidine 20 MG tablet  Commonly known as:  PEPCID  Take 1 tablet (20 mg total) by mouth 2 (two) times daily.     fluocinonide 0.05 % external solution  Commonly known as:  LIDEX  1 APPLICATION APPLY ON THE SKIN AS DIRECTED APPLY TO AFFECTED AREAS DAILY ON SCALP AS NEEDED FOR ITCHING.     fluticasone 50 MCG/ACT nasal spray  Commonly known as:  FLONASE  Place 2 sprays into both nostrils daily.     furosemide 20 MG tablet  Commonly known as:  LASIX  Take 20 mg by mouth daily.     glucose blood test strip  Commonly known as:  ONE TOUCH ULTRA TEST  Check blood sugar daily before breakfast     loratadine 10 MG tablet   Commonly known as:  CLARITIN  Take 1 tablet (10 mg total) by mouth daily.     losartan 25 MG tablet  Commonly known as:  COZAAR  Take 0.5 tablets (12.5 mg total) by mouth daily.     metFORMIN 1000 MG tablet  Commonly known as:  GLUCOPHAGE  TAKE 1 TABLET BY MOUTH TWICE A DAY WITH A MEAL     mycophenolate 500 MG tablet  Commonly known as:  CELLCEPT  Take 500 mg by mouth 2 (two) times daily.     NEXPLANON 68 MG Impl implant  Generic drug:  etonogestrel  1 each by Subdermal route once.     ONE TOUCH ULTRA 2 w/Device Kit  Check blood sugar before meals and at bedtim  polyethylene glycol packet  Commonly known as:  MIRALAX / GLYCOLAX  Take 17 g by mouth daily after supper.     predniSONE 10 MG tablet  Commonly known as:  DELTASONE  Take 30 mg by mouth 2 (two) times daily.     senna-docusate 8.6-50 MG tablet  Commonly known as:  Senokot-S  Take 3 tablets by mouth 2 (two) times daily.        Discharge Instructions: Please refer to Patient Instructions section of EMR for full details.  Patient was counseled important signs and symptoms that should prompt return to medical care, changes in medications, dietary instructions, activity restrictions, and follow up appointments.   Follow-Up Appointments: Follow-up Information    Follow up with [facility]. Go on 03/03/2016.   Specialty:  Pulmonology   Why:  pulmonology follow up at 3:15 PM   Contact information:   [place]      Follow up with Darci Needle, MD. Go on 02/26/2016.   Specialty:  Family Medicine   Why:  Hospital follow-up @ 9:30 AM    Contact information:   [place] [id number] [phone]       Tonette Bihari, MD 02/20/2016, 1:07 PM PGY-1, [place]

## 2016-02-19 NOTE — Care Management Note (Signed)
Case Management Note  Patient Details  Name: MIRTA MALLY MRN: [id number] Date of Birth: [birth year]  Subjective/Objective:     35 yr old female was admitted with generalized malaise, cough, ?sepsis.               Action/Plan:  Case manager spoke with patient concerning need for therapy at home, choice was offered. Referral was called to Josph Macho, [facility] Liaison. Patient states she has rolling walker at home. Will have assistance of her boyfriend at discharge.    Expected Discharge Date:   02/19/16               Expected Discharge Plan:  Home w Home Health Services  In-House Referral:     Discharge planning Services  CM Consult  Post Acute Care Choice:  Home Health Choice offered to:  Patient  DME Arranged:  N/A DME Agency:  NA  HH Arranged:  PT HH Agency:  [facility]  Status of Service:  Completed, signed off  Medicare Important Message Given:    Date Medicare IM Given:    Medicare IM give by:    Date Additional Medicare IM Given:    Additional Medicare Important Message give by:     If discussed at Long Length of Stay Meetings, dates discussed:    Additional Comments:  Durenda Guthrie, RN 02/19/2016, 11:44 AM

## 2016-02-19 NOTE — Progress Notes (Signed)
Physical Therapy Treatment Patient Details Name: Makayla Vasquez MRN: [id number] DOB: Feb 18, 1981 Today's Date: 02/19/2016    History of Present Illness Patient is a 35 yo female admitted 02/15/16 with weakness, cough, and myalgias. Concern for sepsis. PMH is significant for HFrEF (EF 40-45%), T2DM, congenital AV block with pace maker, and interstitial lung disease on chronic prednisone and immunosuppressive therapy.     PT Comments    Pt would continue to benefit from skilled pt in acute and post acute (HHPT) setting.  Plan for initiation of HEP next visit for home use to improve strength and decrease risk of falls.    Follow Up Recommendations  Home health PT;Supervision for mobility/OOB     Equipment Recommendations  None recommended by PT (reports having RW at home for use.  )    Recommendations for Other Services       Precautions / Restrictions Precautions Precautions: Fall Precaution Comments: Pt more receptive to using RW despite not wanting to use device in past.   Restrictions Weight Bearing Restrictions: No    Mobility  Bed Mobility Overal bed mobility: Modified Independent Bed Mobility: Supine to Sit     Supine to sit: Modified independent (Device/Increase time) Sit to supine: Modified independent (Device/Increase time)   General bed mobility comments: Pt performed from flat surface with heavy reliance on rail,  pt required self assistance to loft B LEs into bed.  Pt would benefit from trial without rails to provide cues for technique if patient proves to be unsuccessful.  Pt refused trial without railing.    Transfers Overall transfer level: Needs assistance Equipment used: Rolling walker (2 wheeled) Transfers: Sit to/from Stand Sit to Stand: Supervision         General transfer comment: Pt unsteady during movement and required cues for hand placement.    Ambulation/Gait Ambulation/Gait assistance: Min guard Ambulation Distance (Feet): 89 Feet Assistive  device: Rolling walker (2 wheeled) Gait Pattern/deviations: Step-through pattern;Decreased stride length;Trunk flexed Gait velocity: decreased   General Gait Details: Patient with slow, unsteady HR increased to 127 with gait.  O2 sats decreased to 82% on RA, no signs of SHOB.  Pt returned to 94% after sitting edge of bed.  Pt required cues for turning and backing to maintain safety.     Stairs            Wheelchair Mobility    Modified Rankin (Stroke Patients Only)       Balance     Sitting balance-Leahy Scale: Good       Standing balance-Leahy Scale: Fair                      Cognition Arousal/Alertness: Awake/alert Behavior During Therapy: WFL for tasks assessed/performed Overall Cognitive Status: Within Functional Limits for tasks assessed                      Exercises      General Comments        Pertinent Vitals/Pain Pain Assessment: 0-10 Pain Score: 0-No pain    Home Living                      Prior Function            PT Goals (current goals can now be found in the care plan section) Acute Rehab PT Goals Patient Stated Goal: to go home Potential to Achieve Goals: Good Progress towards PT goals: Progressing toward goals  Frequency  Min 3X/week    PT Plan      Co-evaluation             End of Session Equipment Utilized During Treatment: Gait belt;Oxygen Activity Tolerance: Patient limited by fatigue;Patient limited by pain Patient left: in bed;with call bell/phone within reach;with bed alarm set     Time: 1829-9371 PT Time Calculation (min) (ACUTE ONLY): 14 min  Charges:  $Gait Training: 8-22 mins                    G Codes:      Florestine Avers 03/19/16, 11:40 AM  Joycelyn Rua, PTA pager [phone]

## 2016-02-19 NOTE — Progress Notes (Addendum)
Inpatient Diabetes Program Recommendations  AACE/ADA: New Consensus Statement on Inpatient Glycemic Control (2015)  Target Ranges:  Prepandial:   less than 140 mg/dL      Peak postprandial:   less than 180 mg/dL (1-2 hours)      Critically ill patients:  140 - 180 mg/dL  Results for Makayla Vasquez, Makayla Vasquez (MRN [id number]) as of 02/19/2016 11:03  Ref. Range 02/18/2016 05:29 02/18/2016 11:34 02/18/2016 16:48 02/18/2016 21:37 02/19/2016 06:40  Glucose-Capillary Latest Ref Range: 65-99 mg/dL 920 (H) 100 (H) 712 (H) 176 (H) 300 (H)   Review of Glycemic Control  Diabetes history: DM2 Outpatient Diabetes medications: Metformin 1000 mg BID (has been on insulin in the past) Current orders for Inpatient glycemic control: Novolog 0-9 units TID with meals, Novolog 0-5 units QHS  Inpatient Diabetes Program Recommendations: Insulin - Basal: If steroids will be continued, please consider ordering low dose basal insulin. Recommend starting with Lantus 7 units QHS (based on 73 kg x 0.1 units).  Thanks, [name], RN, MSN, CDE Diabetes Coordinator Inpatient Diabetes Program [phone] (Team Pager from 8am to 5pm) [phone] ([facility] office) [phone] [facility] office) [phone] [facility] office)

## 2016-02-19 NOTE — Progress Notes (Signed)
Family Medicine Teaching Service Daily Progress Note Intern Pager: [phone]  Patient name: Makayla Vasquez Medical record number: [id number] Date of birth: [birth year] Age: 35 y.o. Gender: female  Primary Care Provider: Nobie Putnam, DO Consultants: CCM Code Status: Full  Pt Overview and Major Events to Date:  4/29: Admitted with concerns for sepsis w/ hypotension, but clinically stable and lactic acid wnl  Assessment and Plan: Makayla Vasquez is a 35 y.o. female presenting with weakness, cough, and myalgias. Concern for sepsis. PMH is significant for HFrEF (EF 40-45%), T2DM, congenital AV block with pace maker, and interstitial lung disease on chronic prednisone and immunosuppressive therapy.   Concerns for Sepsis: Patient met SIRs criteria with tachycardia and tachypnea on admit. Qsofa score of 2. Not suspecting Sepsis at this point based on patient being afebrile and non-concerning labs. Vanc/Zosyn initiated in ED but were stopped on 4/30. Patient still afebrile but continues to have some hypotension. - Back on home dose of 30 mg PO BID Delstasone 30 mg BID and watch BP closely  Elevated CK/Myalgias/LE Weakness: Patient with h/o steroid induced myopathy. Also concern for potential viral myositis vs statin induced. Patient with h/o elevated TSH at previous hospitalization so potential for hypothyroid myopathy. Troponin Negative; Influenza panel: Negative. TSH wnl. Patient does have h/o of presenting with similar complaints. Did have stay in CIR recently for similar presentation.  - holding home Lipitor  - check HIV - Trend CK: 3832 (4/29) > 1705 (4/30)> 796 (5/1) - PT/OT consults: OT (no recs) and refused PT yesterday due to fatigue and pain. Has HHPT seeing her already at home - Steroids as above  Hypokalemia: resolved  HFrEF (EF40-45%): CXR with signs of possible pulmonary edema on admission although lung exam is unremarkable for crackles. No clinical signs of fluid overload.  Several unrecorded voids.  - Daily weights and strict I/O - Cautiously resume home Bisprolol 2.5 mg daily - Continue to hold Cozaar and Lasix in the setting of hypotension   Hypotension: BP throughout admission: 100-87/66-51 range, this AM 94/51. This is also in the setting of holding all of her antihypertensives: (Bisprolol, Cozaar) and Lasix. In outpatient setting in March BP have been in 919-166'M range systolically and 60'O range diastolically although per cardiology note, does have hx of hypotension. BP could be low due to patient laying in bed for majority of day and not moving. Additionally, on home dose of steroids now as above.  - Keep close eye on BP  Interstitial Lung Disease, on chronic prednisone and immunosuppressive therapy: On 4L O2 at home  - supplemental O2, to keep O2 > 92% - continue home Cellcept 500 mg BID, per Pulm after acute infection this should be titrated up as outpatient to wean off prednisone  - diuresis when BP will tolerate  - cont Dulera BID and Albuterol PRN  - recommended open lung biopsy when acute infection resolves  - Called patient's pulmonologist (Dr. Annamaria Boots) who recommended decreasing Prednisone by 20 mg weekly at discharge and follow up at his office in 2 weeks.   T2DM: From chronic steroid use. At rehab discharge in March, patient was instructed to d/c insulin use as had episodes of hypoglycemia as steroid dose was lowered. Fasting CBG of 300 this AM, has had 13 units of Novolog over last 24 hours. - heart healthy carb modified diet - hold home metformin  - sensitive SSI  - CBGs AC/HS   Congenital AVN Block s/p atrial pacer (placed at age 24yo):  -  on telemetry   FEN/GI: Heart Healthy/Carb Modified Diet, SLIV Prophylaxis: Lovenox   Disposition: med-surg  Subjective:  Patient no complaints of concerns this morning. States she has some pain in legs and bag but improved from yesterday. Is able to eat and drink. Had a small BM yesterday.  Denies CP, SOB, fevers.  Objective: Temp:  [97.6 F (36.4 C)-98.1 F (36.7 C)] 97.6 F (36.4 C) (05/02 0500) Pulse Rate:  [86-94] 89 (05/02 0500) Resp:  [16-18] 18 (05/02 0500) BP: (94-106)/(51-64) 94/51 mmHg (05/02 0500) SpO2:  [86 %-98 %] 98 % (05/02 0500) FiO2 (%):  [96 %] 96 % (05/01 2125) Weight:  [162 lb 4.1 oz (73.6 kg)] 162 lb 4.1 oz (73.6 kg) (05/02 0500)  Physical Exam: General: lying in bed, NAD Cardiovascular: RRR. No m/r/g Respiratory: CTAB. Normal WOB Abdomen: +BS, soft, NDNT MSK: 5/5 strength in bilateral lower extremities. 5/5 strength in right upper extremity, 4/5 strength is left upper extremity; No swelling or erythema, no tenderness to palpation.  Skin: Warm and dry.  Neuro: Alert and oriented x 3 Psych: Flat affect. Answers questions and follows commands.   Laboratory:  Recent Labs Lab 02/16/16 0012 02/16/16 1149 02/17/16 0631  WBC 8.0 6.1 4.4  HGB 10.9* 9.1* 9.2*  HCT 35.5* 30.2* 29.8*  PLT 345 255 273    Recent Labs Lab 02/16/16 0012 02/16/16 1149 02/17/16 0631 02/18/16 0900  NA 137  --  138 141  K 4.2  --  3.1* 4.6  CL 102  --  106 111  CO2 24  --  24 22  BUN 10  --  8 10  CREATININE 0.53 0.46 0.53 0.70  CALCIUM 7.9*  --  8.1* 8.2*  PROT 7.0  --  5.5*  --   BILITOT 1.9*  --  0.8  --   ALKPHOS 59  --  42  --   ALT 26  --  20  --   AST 92*  --  40  --   GLUCOSE 125*  --  170* 248*    Imaging/Diagnostic Tests: Dg Chest 2 View  02/16/2016  CLINICAL DATA:  Generalized body aches, cough and fever for 1 week. History of hypertension, diabetes, lupus. EXAM: CHEST  2 VIEW COMPARISON:  Chest radiograph December 19, 2015 FINDINGS: Cardiomediastinal silhouette is normal for this low inspiratory portable examination with crowded vascular markings. Pulmonary vascular congestion without pleural effusion or focal consolidation. Mild interstitial prominence. LEFT cardiac pacemaker in situ. No pneumothorax. Soft tissue planes and included osseous  structures are nonsuspicious. Surgical clips in the included right abdomen compatible with cholecystectomy. IMPRESSION: Pulmonary vascular congestion and mild interstitial prominence suggesting early pulmonary edema without focal consolidation. Electronically Signed   By: Elon Alas M.D.   On: 02/16/2016 01:22   Carlyle Dolly, MD 02/19/2016, 7:13 AM PGY-1, [place] Intern pager: [phone], text pages welcome

## 2016-02-20 LAB — ANCA TITERS: P-ANCA: <1:20 {titer}

## 2016-02-20 LAB — GLUCOSE, CAPILLARY
GLUCOSE-CAPILLARY: 233 mg/dL — AB (ref 65–99)
Glucose-Capillary: 278 mg/dL — ABNORMAL HIGH (ref 65–99)

## 2016-02-20 NOTE — Discharge Instructions (Signed)
You were admitted due to worry that you had an serious infection in part because of your low blood pressures. However, we found no source of infection and your blood pressure were determined to run low normally. We have not changed any of your medications. Follow up with your rheumatologist regarding your prednisone. No plans to taper since you were recently continued on that dose per appointment last Monday. Please follow up with [facility].

## 2016-02-20 NOTE — Progress Notes (Signed)
Pt given discharge orders with pt's verbalization of understanding. No pain at present time. No sob. All belongings sent home with pt.  Discharged via wheelchair to car with significant other.

## 2016-02-21 ENCOUNTER — Inpatient Hospital Stay (HOSPITAL_COMMUNITY)
Admission: EM | Admit: 2016-02-21 | Discharge: 2016-02-26 | DRG: 555 | Disposition: A | Discharge status: Home Health | Payer: Medicare Other | Attending: Family Medicine | Admitting: Family Medicine

## 2016-02-21 ENCOUNTER — Inpatient Hospital Stay (HOSPITAL_COMMUNITY): Admission: RE | Admit: 2016-02-21 | Payer: Medicare Other | Source: Ambulatory Visit

## 2016-02-21 ENCOUNTER — Emergency Department (HOSPITAL_COMMUNITY): Payer: Medicare Other

## 2016-02-21 ENCOUNTER — Encounter (HOSPITAL_COMMUNITY): Payer: Self-pay

## 2016-02-21 DIAGNOSIS — I959 Hypotension, unspecified: Secondary | ICD-10-CM | POA: Diagnosis present

## 2016-02-21 DIAGNOSIS — Z950 Presence of cardiac pacemaker: Secondary | ICD-10-CM

## 2016-02-21 DIAGNOSIS — I5032 Chronic diastolic (congestive) heart failure: Secondary | ICD-10-CM

## 2016-02-21 DIAGNOSIS — M329 Systemic lupus erythematosus, unspecified: Secondary | ICD-10-CM | POA: Diagnosis not present

## 2016-02-21 DIAGNOSIS — Z9114 Patient's other noncompliance with medication regimen: Secondary | ICD-10-CM | POA: Diagnosis not present

## 2016-02-21 DIAGNOSIS — R109 Unspecified abdominal pain: Secondary | ICD-10-CM | POA: Diagnosis not present

## 2016-02-21 DIAGNOSIS — J849 Interstitial pulmonary disease, unspecified: Secondary | ICD-10-CM | POA: Diagnosis present

## 2016-02-21 DIAGNOSIS — R112 Nausea with vomiting, unspecified: Secondary | ICD-10-CM | POA: Diagnosis not present

## 2016-02-21 DIAGNOSIS — R531 Weakness: Secondary | ICD-10-CM

## 2016-02-21 DIAGNOSIS — D638 Anemia in other chronic diseases classified elsewhere: Secondary | ICD-10-CM | POA: Diagnosis present

## 2016-02-21 DIAGNOSIS — Z888 Allergy status to other drugs, medicaments and biological substances status: Secondary | ICD-10-CM | POA: Diagnosis not present

## 2016-02-21 DIAGNOSIS — Z7951 Long term (current) use of inhaled steroids: Secondary | ICD-10-CM | POA: Diagnosis not present

## 2016-02-21 DIAGNOSIS — E1159 Type 2 diabetes mellitus with other circulatory complications: Secondary | ICD-10-CM | POA: Diagnosis present

## 2016-02-21 DIAGNOSIS — I10 Essential (primary) hypertension: Secondary | ICD-10-CM | POA: Diagnosis present

## 2016-02-21 DIAGNOSIS — E785 Hyperlipidemia, unspecified: Secondary | ICD-10-CM | POA: Diagnosis present

## 2016-02-21 DIAGNOSIS — J9610 Chronic respiratory failure, unspecified whether with hypoxia or hypercapnia: Secondary | ICD-10-CM | POA: Diagnosis present

## 2016-02-21 DIAGNOSIS — A419 Sepsis, unspecified organism: Secondary | ICD-10-CM

## 2016-02-21 DIAGNOSIS — Z87891 Personal history of nicotine dependence: Secondary | ICD-10-CM | POA: Diagnosis not present

## 2016-02-21 DIAGNOSIS — Z91048 Other nonmedicinal substance allergy status: Secondary | ICD-10-CM | POA: Diagnosis not present

## 2016-02-21 DIAGNOSIS — Z7984 Long term (current) use of oral hypoglycemic drugs: Secondary | ICD-10-CM | POA: Diagnosis not present

## 2016-02-21 DIAGNOSIS — F419 Anxiety disorder, unspecified: Secondary | ICD-10-CM | POA: Diagnosis present

## 2016-02-21 DIAGNOSIS — J4520 Mild intermittent asthma, uncomplicated: Secondary | ICD-10-CM | POA: Diagnosis present

## 2016-02-21 DIAGNOSIS — Z7952 Long term (current) use of systemic steroids: Secondary | ICD-10-CM | POA: Diagnosis not present

## 2016-02-21 DIAGNOSIS — E669 Obesity, unspecified: Secondary | ICD-10-CM | POA: Diagnosis present

## 2016-02-21 DIAGNOSIS — F3170 Bipolar disorder, currently in remission, most recent episode unspecified: Secondary | ICD-10-CM | POA: Diagnosis present

## 2016-02-21 DIAGNOSIS — Z8249 Family history of ischemic heart disease and other diseases of the circulatory system: Secondary | ICD-10-CM | POA: Diagnosis not present

## 2016-02-21 DIAGNOSIS — I110 Hypertensive heart disease with heart failure: Secondary | ICD-10-CM | POA: Diagnosis present

## 2016-02-21 DIAGNOSIS — R6510 Systemic inflammatory response syndrome (SIRS) of non-infectious origin without acute organ dysfunction: Secondary | ICD-10-CM | POA: Diagnosis not present

## 2016-02-21 DIAGNOSIS — Q246 Congenital heart block: Secondary | ICD-10-CM

## 2016-02-21 DIAGNOSIS — D8989 Other specified disorders involving the immune mechanism, not elsewhere classified: Secondary | ICD-10-CM | POA: Diagnosis present

## 2016-02-21 DIAGNOSIS — I5042 Chronic combined systolic (congestive) and diastolic (congestive) heart failure: Secondary | ICD-10-CM | POA: Diagnosis present

## 2016-02-21 DIAGNOSIS — M6089 Other myositis, multiple sites: Secondary | ICD-10-CM | POA: Diagnosis present

## 2016-02-21 DIAGNOSIS — K580 Irritable bowel syndrome with diarrhea: Secondary | ICD-10-CM | POA: Diagnosis present

## 2016-02-21 DIAGNOSIS — T380X6A Underdosing of glucocorticoids and synthetic analogues, initial encounter: Secondary | ICD-10-CM | POA: Diagnosis present

## 2016-02-21 DIAGNOSIS — M069 Rheumatoid arthritis, unspecified: Secondary | ICD-10-CM | POA: Diagnosis present

## 2016-02-21 DIAGNOSIS — IMO0001 Reserved for inherently not codable concepts without codable children: Secondary | ICD-10-CM | POA: Diagnosis present

## 2016-02-21 DIAGNOSIS — Z6826 Body mass index (BMI) 26.0-26.9, adult: Secondary | ICD-10-CM

## 2016-02-21 DIAGNOSIS — R1110 Vomiting, unspecified: Secondary | ICD-10-CM | POA: Diagnosis not present

## 2016-02-21 DIAGNOSIS — R197 Diarrhea, unspecified: Secondary | ICD-10-CM | POA: Diagnosis not present

## 2016-02-21 DIAGNOSIS — R0602 Shortness of breath: Secondary | ICD-10-CM

## 2016-02-21 DIAGNOSIS — Z79899 Other long term (current) drug therapy: Secondary | ICD-10-CM | POA: Diagnosis not present

## 2016-02-21 DIAGNOSIS — K219 Gastro-esophageal reflux disease without esophagitis: Secondary | ICD-10-CM | POA: Diagnosis present

## 2016-02-21 DIAGNOSIS — Z793 Long term (current) use of hormonal contraceptives: Secondary | ICD-10-CM

## 2016-02-21 DIAGNOSIS — Z9981 Dependence on supplemental oxygen: Secondary | ICD-10-CM

## 2016-02-21 DIAGNOSIS — R918 Other nonspecific abnormal finding of lung field: Secondary | ICD-10-CM | POA: Diagnosis not present

## 2016-02-21 DIAGNOSIS — R7881 Bacteremia: Secondary | ICD-10-CM | POA: Diagnosis present

## 2016-02-21 DIAGNOSIS — I459 Conduction disorder, unspecified: Secondary | ICD-10-CM | POA: Diagnosis present

## 2016-02-21 LAB — CBC WITH DIFFERENTIAL/PLATELET
BASOS ABS: 0 10*3/uL (ref 0.0–0.1)
BASOS PCT: 0 %
EOS ABS: 0.1 10*3/uL (ref 0.0–0.7)
Eosinophils Relative: 1 %
HEMATOCRIT: 36.7 % (ref 36.0–46.0)
HEMOGLOBIN: 11.2 g/dL — AB (ref 12.0–15.0)
Lymphocytes Relative: 13 %
Lymphs Abs: 1.9 10*3/uL (ref 0.7–4.0)
MCH: 25.5 pg — ABNORMAL LOW (ref 26.0–34.0)
MCHC: 30.5 g/dL (ref 30.0–36.0)
MCV: 83.6 fL (ref 78.0–100.0)
MONOS PCT: 6 %
Monocytes Absolute: 0.8 10*3/uL (ref 0.1–1.0)
NEUTROS ABS: 11.4 10*3/uL — AB (ref 1.7–7.7)
NEUTROS PCT: 80 %
Platelets: 369 10*3/uL (ref 150–400)
RBC: 4.39 MIL/uL (ref 3.87–5.11)
RDW: 18.1 % — ABNORMAL HIGH (ref 11.5–15.5)
WBC: 14.2 10*3/uL — AB (ref 4.0–10.5)

## 2016-02-21 LAB — COMPREHENSIVE METABOLIC PANEL
ALK PHOS: 59 U/L (ref 38–126)
ALT: 30 U/L (ref 14–54)
ANION GAP: 9 (ref 5–15)
AST: 74 U/L — AB (ref 15–41)
Albumin: 2.4 g/dL — ABNORMAL LOW (ref 3.5–5.0)
BUN: 11 mg/dL (ref 6–20)
CALCIUM: 8.7 mg/dL — AB (ref 8.9–10.3)
CO2: 27 mmol/L (ref 22–32)
CREATININE: 0.64 mg/dL (ref 0.44–1.00)
Chloride: 101 mmol/L (ref 101–111)
Glucose, Bld: 119 mg/dL — ABNORMAL HIGH (ref 65–99)
Potassium: 4.1 mmol/L (ref 3.5–5.1)
SODIUM: 137 mmol/L (ref 135–145)
Total Bilirubin: 0.8 mg/dL (ref 0.3–1.2)
Total Protein: 6.4 g/dL — ABNORMAL LOW (ref 6.5–8.1)

## 2016-02-21 LAB — TROPONIN I: TROPONIN I: 0.09 ng/mL — AB (ref <0.031)

## 2016-02-21 LAB — URINALYSIS, ROUTINE W REFLEX MICROSCOPIC
Glucose, UA: NEGATIVE mg/dL
KETONES UR: NEGATIVE mg/dL
Nitrite: NEGATIVE
Protein, ur: 30 mg/dL — AB
SPECIFIC GRAVITY, URINE: 1.02 (ref 1.005–1.030)
pH: 6.5 (ref 5.0–8.0)

## 2016-02-21 LAB — I-STAT CG4 LACTIC ACID, ED
Lactic Acid, Venous: 2.21 mmol/L (ref 0.5–2.0)
Lactic Acid, Venous: 0.89 mmol/L (ref 0.5–2.0)

## 2016-02-21 LAB — CULTURE, BLOOD (ROUTINE X 2)
Culture: NO GROWTH
Culture: NO GROWTH

## 2016-02-21 LAB — URINE MICROSCOPIC-ADD ON

## 2016-02-21 LAB — I-STAT BETA HCG BLOOD, ED (MC, WL, AP ONLY)

## 2016-02-21 LAB — CK: CK TOTAL: 3265 U/L — AB (ref 38–234)

## 2016-02-21 MED ORDER — PIPERACILLIN-TAZOBACTAM 3.375 G IVPB
3.3750 g | Freq: Three times a day (TID) | INTRAVENOUS | Status: DC
  Administered 2016-02-22 – 2016-02-23 (×5): 3.375 g via INTRAVENOUS
  Filled 2016-02-21 (×6): qty 50

## 2016-02-21 MED ORDER — HYDROCORTISONE NA SUCCINATE PF 100 MG IJ SOLR
100.0000 mg | Freq: Once | INTRAMUSCULAR | Status: AC
  Administered 2016-02-21: 100 mg via INTRAVENOUS
  Filled 2016-02-21: qty 2

## 2016-02-21 MED ORDER — IOPAMIDOL (ISOVUE-300) INJECTION 61%
100.0000 mL | Freq: Once | INTRAVENOUS | Status: AC | PRN
  Administered 2016-02-21: 100 mL via INTRAVENOUS

## 2016-02-21 MED ORDER — ACETAMINOPHEN 325 MG PO TABS
650.0000 mg | ORAL_TABLET | Freq: Once | ORAL | Status: AC
  Administered 2016-02-21: 650 mg via ORAL
  Filled 2016-02-21: qty 2

## 2016-02-21 MED ORDER — SODIUM CHLORIDE 0.9 % IV BOLUS (SEPSIS)
250.0000 mL | Freq: Once | INTRAVENOUS | Status: AC
  Administered 2016-02-21: 250 mL via INTRAVENOUS

## 2016-02-21 MED ORDER — DIATRIZOATE MEGLUMINE & SODIUM 66-10 % PO SOLN
15.0000 mL | Freq: Once | ORAL | Status: AC
  Administered 2016-02-21: 15 mL via ORAL

## 2016-02-21 MED ORDER — SODIUM CHLORIDE 0.9 % IV BOLUS (SEPSIS)
500.0000 mL | Freq: Once | INTRAVENOUS | Status: AC
  Administered 2016-02-21: 500 mL via INTRAVENOUS

## 2016-02-21 MED ORDER — SODIUM CHLORIDE 0.9 % IV BOLUS (SEPSIS)
1000.0000 mL | Freq: Once | INTRAVENOUS | Status: AC
Start: 2016-02-21 — End: 2016-02-21
  Administered 2016-02-21: 1000 mL via INTRAVENOUS

## 2016-02-21 MED ORDER — PIPERACILLIN-TAZOBACTAM 3.375 G IVPB 30 MIN
3.3750 g | Freq: Once | INTRAVENOUS | Status: AC
  Administered 2016-02-21: 3.375 g via INTRAVENOUS
  Filled 2016-02-21: qty 50

## 2016-02-21 MED ORDER — SODIUM CHLORIDE 0.9 % IV BOLUS (SEPSIS)
1000.0000 mL | Freq: Once | INTRAVENOUS | Status: AC
  Administered 2016-02-21: 1000 mL via INTRAVENOUS

## 2016-02-21 NOTE — ED Notes (Signed)
RN and Dr. Fayrene Fearing notified of pt's Lactic Acid of 2.21

## 2016-02-21 NOTE — ED Notes (Signed)
Below order not completed by EW. 

## 2016-02-21 NOTE — Progress Notes (Signed)
Pharmacy Antibiotic Follow-up Note  Makayla Vasquez is a 35 y.o. year-old female admitted on 02/21/2016.  The patient is currently on day 1 of Zosyn for r/u sepsis.   Assessment/Plan: Zosyn 3.375g IV q8h (4 hour infusion). 34 yoF recent admit 4/28-5/3 for sepsis, r/o UTI. Returned to ED 5/4 with N/V/D with abd pain. Hx of Lupus, on immunosuppressive therapy, Mycophenolate.    Temp (24hrs), Avg:100.7 F (38.2 C), Min:100.7 F (38.2 C), Max:100.7 F (38.2 C)   Recent Labs Lab 02/16/16 0012 02/16/16 1149 02/17/16 0631  WBC 8.0 6.1 4.4    Recent Labs Lab 02/16/16 0012 02/16/16 1149 02/17/16 0631 02/18/16 0900  CREATININE 0.53 0.46 0.53 0.70   Estimated Creatinine Clearance: 99.5 mL/min (by C-G formula based on Cr of 0.7).    Allergies  Allergen Reactions  . 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 Sertraline Hcl Hives  . Tape Other (See Comments)    Burns skin   Antimicrobials this admission: 5/4 Zosyn >>   Microbiology results: 5/4 BCx: sent 5/4 UCx: sent   Thank you for allowing pharmacy to be a part of this patient's care.  Otho Bellows PharmD Pager [phone] 02/21/2016, 6:25 PM

## 2016-02-21 NOTE — ED Provider Notes (Signed)
CSN: [id number]     Arrival date & time 02/21/16  1533 History   First MD Initiated Contact with Patient 02/21/16 1605     Chief Complaint  Patient presents with  . Emesis  . Diarrhea      HPI  Patient with a history of ventricular pacemaker and congenital complete heart block, early dysfunction with EF of 35, acute diabetes, connective tissue disorder/lupus, congestive heart failure, hypertension., And home O2 dependent at 2 L nasal cannula.  Presents with a complaint of nausea vomiting diarrhea and abdominal pain. Febrile upon arrival.  Easily diagnosed with sepsis and admitted. Treated with IV fluids and IV antibiotics. On day 2 of hospitalization no apparent infection therefore antibiotic discontinued. Was addisonian with hypotension. Treated with high-dose steroids. Pressures recovered. She was feeling well upon discharge, she was discharged yesterday. In the last 24 hours she has redeveloped nausea vomiting diarrhea abdominal pain lightheadedness dizziness weakness and presents here.  Also states she's had more of a productive cough, no sputum.. Feels short of breath.   Past Medical History  Diagnosis Date  . Cardiac pacemaker     a. Since age 43 in 62. b. Upgrade to BiV in 2013.  . Congenital complete AV block   . Obesity   . GERD (gastroesophageal reflux disease)   . Asymptomatic LV dysfunction     a. Echo in Dec 2011 with EF 35 to 40%. Felt to be due to paced rhythm. b. EF 25-30% in 07/2014.  . Seizures ([place])     as a child- from high fever  . Anxiety   . Bipolar affective disorder ([place])   . Depression     bipolar  . Carpal tunnel syndrome of right wrist   . Asthma     seasonal allergies   . Arthritis     rheumatoid arthritis- mild, no rheumatology care   . Hypertension   . Pneumonitis     a. a/w hypoxia - inflammatory - large workup 07/2014.  [name] Sinus tachycardia ([place])   . Diabetes mellitus without complication ([place])   . Presence of permanent cardiac pacemaker    . CHF (congestive heart failure) ([place])   . Lupus ([place])   . Lupus (systemic lupus erythematosus) ([place])    Past Surgical History  Procedure Laterality Date  . Throat surgery  1994    s/p laser treatment  . Cesarean section    . Insert / replace / remove pacemaker      2001  . Cholecystectomy    . Iud removal  11/03/2011    Procedure: INTRAUTERINE DEVICE (IUD) REMOVAL;  Surgeon: [name], MD;  Location: [facility];  Service: Gynecology;  Laterality: N/A;  . Cyst excision  12/10/2012    THYROID  . Thyroglossal duct cyst N/A 12/10/2012    Procedure: REVISION OF THYROGLOSSAL DUCT CYST EXCISION;  Surgeon: [name], MD;  Location: [facility];  Service: ENT;  Laterality: N/A;  Revision of Thyroglossal Duct Cyst Excision  . Nasal fracture surgery      /w plate   . Carpal tunnel with cubital tunnel Right 07/26/2013    Procedure: RIGHT LIMITED OPEN CARPAL TUNNEL RELEASE ,  RIGHT CUBITAL TUNNEL RELEASE, INSITU VERSES ULNAR NERVE DECOMPRESSION AND ANTERIOR TRANSPOSITION;  Surgeon: [name], MD;  Location: [facility];  Service: Orthopedics;  Laterality: Right;  . Video bronchoscopy Bilateral 06/19/2014    Procedure: VIDEO BRONCHOSCOPY WITHOUT FLUORO;  Surgeon: Brand Males, MD;  Location: [facility];  Service: Cardiopulmonary;  Laterality: Bilateral;  .  Bi-ventricular pacemaker upgrade N/A 03/08/2012    Procedure: BI-VENTRICULAR PACEMAKER UPGRADE;  Surgeon: [name], MD;  Location: [facility] CATH LAB;  Service: Cardiovascular;  Laterality: N/A;  . Atrial tach ablation N/A 08/14/2014    Procedure: ATRIAL TACH ABLATION;  Surgeon: [name], MD;  Location: [facility] CATH LAB;  Service: Cardiovascular;  Laterality: N/A;  . Right heart catheterization N/A 10/26/2014    Procedure: RIGHT HEART CATH;  Surgeon: [name], MD;  Location: [facility] CATH LAB;  Service: Cardiovascular;  Laterality: N/A;   Family History  Problem Relation Age of Onset  . Heart disease Mother     CHF (no details)  . Hypertension  Mother   . Heart disease Father     Murmur  . Heart disease Sister 64     No details.  History of a pacemaker  . Lupus Mother    Social History  Substance Use Topics  . Smoking status: Former Smoker -- 0.25 packs/day for .5 years    Types: Cigarettes    Quit date: 07/25/1996  . Smokeless tobacco: Never Used  . Alcohol Use: No   OB History    Gravida Para Term Preterm AB TAB SAB Ectopic Multiple Living   _0 Review of Systems  Constitutional: Positive for activity change and fatigue. Negative for fever, chills, diaphoresis and appetite change.  HENT: Negative for mouth sores, sore throat and trouble swallowing.   Eyes: Negative for visual disturbance.  Respiratory: Positive for cough and shortness of breath. Negative for chest tightness and wheezing.   Cardiovascular: Negative for chest pain.  Gastrointestinal: Positive for nausea, vomiting, abdominal pain and diarrhea. Negative for blood in stool and abdominal distention.  Endocrine: Negative for polydipsia, polyphagia and polyuria.  Genitourinary: Negative for dysuria, frequency and hematuria.  Musculoskeletal: Negative for gait problem.  Skin: Negative for color change, pallor and rash.  Neurological: Positive for light-headedness. Negative for dizziness, syncope and headaches.  Hematological: Does not bruise/bleed easily.  Psychiatric/Behavioral: Negative for behavioral problems and confusion.      Allergies  Atorvastatin; Sertraline hcl; and Tape  Home Medications   Prior to Admission medications   Medication Sig Start Date End Date Taking? Authorizing Provider  albuterol (PROVENTIL HFA;VENTOLIN HFA) 108 (90 BASE) MCG/ACT inhaler Inhale 2 puffs into the lungs every 4 (four) hours as needed for wheezing or shortness of breath. 06/05/15  Yes Alexander Devin Going, DO  bisoprolol (ZEBETA) 5 MG tablet Take 0.5 tablets (2.5 mg total) by mouth daily. 01/08/16  Yes [name], MD  budesonide-formoterol  [facility]) 80-4.5 MCG/ACT inhaler Inhale 2 puffs into the lungs 2 (two) times daily as needed (for shortness of breath). 01/07/16  Yes [name], NP  calcium-vitamin D (OSCAL WITH D) 500-200 MG-UNIT per tablet Take 1 tablet by mouth daily with breakfast.    Yes Historical Provider, MD  camphor-menthol Timoteo Ace) lotion Apply topically 2 (two) times daily. Patient taking differently: Apply 1 application topically 2 (two) times daily as needed for itching.  12/26/15  Yes [name], PA-C  etonogestrel (NEXPLANON) 68 MG IMPL implant 1 each by Subdermal route once.   Yes Historical Provider, MD  famotidine (PEPCID) 20 MG tablet Take 1 tablet (20 mg total) by mouth 2 (two) times daily. Patient taking differently: Take 20 mg by mouth daily.  12/26/15  Yes [name], PA-C  fluocinonide (LIDEX) 0.05 % external solution 1 APPLICATION APPLY  ON THE SKIN AS DIRECTED APPLY TO AFFECTED AREAS DAILY ON SCALP AS NEEDED FOR ITCHING. 09/28/15  Yes Historical Provider, MD  fluticasone (FLONASE) 50 MCG/ACT nasal spray Place 2 sprays into both nostrils daily. 01/07/16  Yes [name], NP  furosemide (LASIX) 20 MG tablet Take 20 mg by mouth daily. 11/19/15  Yes Historical Provider, MD  loratadine (CLARITIN) 10 MG tablet Take 1 tablet (10 mg total) by mouth daily. Patient taking differently: Take 10 mg by mouth daily as needed.  01/07/16  Yes [name], NP  losartan (COZAAR) 25 MG tablet Take 0.5 tablets (12.5 mg total) by mouth daily. 01/08/16  Yes [name], MD  metFORMIN (GLUCOPHAGE) 1000 MG tablet TAKE 1 TABLET BY MOUTH TWICE A DAY WITH A MEAL 05/18/15  Yes [name], DO  mycophenolate (CELLCEPT) 500 MG tablet Take 500 mg by mouth 2 (two) times daily.   Yes Historical Provider, MD  polyethylene glycol (MIRALAX / GLYCOLAX) packet Take 17 g by mouth daily after supper. Patient taking differently: Take 17 g by mouth daily as needed for mild constipation.  12/26/15  Yes [name], PA-C   predniSONE (DELTASONE) 10 MG tablet Take 30 mg by mouth 2 (two) times daily.   Yes Historical Provider, MD  senna-docusate (SENOKOT-S) 8.6-50 MG tablet Take 3 tablets by mouth 2 (two) times daily. Patient taking differently: Take 3 tablets by mouth 2 (two) times daily as needed for mild constipation.  12/26/15  Yes [name], PA-C  Blood Glucose Monitoring Suppl (ONE TOUCH ULTRA 2) w/Device KIT Check blood sugar before meals and at bedtim 12/27/15   [name], PA-C  glucose blood (ONE TOUCH ULTRA TEST) test strip Check blood sugar daily before breakfast 10/25/14   Leone Brand, MD   BP 82/39 mmHg  Pulse 65  Temp(Src) 100.7 F (38.2 C) (Oral)  Resp 15  Wt 150 lb (68.04 kg)  SpO2 92% Physical Exam  Constitutional: She is oriented to person, place, and time. She appears well-developed and well-nourished. No distress.  Awake and alert. Laying on her right side. Waking conversant.  HENT:  Head: Normocephalic.  Eyes: Conjunctivae are normal. Pupils are equal, round, and reactive to light. No scleral icterus.  Neck: Normal range of motion. Neck supple. No thyromegaly present.  Cardiovascular: Normal rate and regular rhythm.  Exam reveals no gallop and no friction rub.   No murmur heard. Pulmonary/Chest: Effort normal and breath sounds normal. No respiratory distress. She has no wheezes. She has no rales.  Abdominal: Soft. Bowel sounds are normal. She exhibits no distension. There is tenderness in the right upper quadrant and epigastric area. There is no rebound.  Soft abdomen. No rebound or peritoneal irritation. Indicates epigastrium, and right upper quadrant.  Musculoskeletal: Normal range of motion.  Neurological: She is alert and oriented to person, place, and time.  Skin: Skin is warm and dry. No rash noted.  Psychiatric: She has a normal mood and affect. Her behavior is normal.    ED Course  Procedures (including critical care time) Labs Review Labs Reviewed  COMPREHENSIVE  METABOLIC PANEL - Abnormal; Notable for the following:    Glucose, Bld 119 (*)    Calcium 8.7 (*)    Total Protein 6.4 (*)    Albumin 2.4 (*)    AST 74 (*)    All other components within normal limits  CBC WITH DIFFERENTIAL/PLATELET - Abnormal; Notable for the following:    WBC 14.2 (*)  Hemoglobin 11.2 (*)    MCH 25.5 (*)    RDW 18.1 (*)    Neutro Abs 11.4 (*)    All other components within normal limits  URINALYSIS, ROUTINE W REFLEX MICROSCOPIC (NOT AT [facility]) - Abnormal; Notable for the following:    Color, Urine AMBER (*)    APPearance CLOUDY (*)    Hgb urine dipstick TRACE (*)    Bilirubin Urine SMALL (*)    Protein, ur 30 (*)    Leukocytes, UA SMALL (*)    All other components within normal limits  TROPONIN I - Abnormal; Notable for the following:    Troponin I 0.09 (*)    All other components within normal limits  CK - Abnormal; Notable for the following:    Total CK 3265 (*)    All other components within normal limits  URINE MICROSCOPIC-ADD ON - Abnormal; Notable for the following:    Squamous Epithelial / LPF 6-30 (*)    Bacteria, UA MANY (*)    All other components within normal limits  I-STAT CG4 LACTIC ACID, ED - Abnormal; Notable for the following:    Lactic Acid, Venous 2.21 (*)    All other components within normal limits  CULTURE, BLOOD (ROUTINE X 2)  CULTURE, BLOOD (ROUTINE X 2)  URINE CULTURE  C DIFFICILE QUICK SCREEN W PCR REFLEX  I-STAT BETA HCG BLOOD, ED ([facility], [facility], [facility] ONLY)  I-STAT CG4 LACTIC ACID, ED    Imaging Review Dg Chest 1 View  02/21/2016  CLINICAL DATA:  Shortness of breath, chest pain, potential sepsis EXAM: CHEST 1 VIEW COMPARISON:  02/16/2016 FINDINGS: Cardiomediastinal silhouette is stable. Three leads cardiac pacemaker is unchanged in position. Central mild vascular congestion and mild interstitial prominence bilaterally suspicious for interstitial edema. No segmental consolidation. IMPRESSION: Three leads cardiac pacemaker is unchanged  in position. Central mild vascular congestion and mild interstitial prominence bilaterally suspicious for interstitial edema. No segmental consolidation. Electronically Signed   By: Lahoma Crocker M.D.   On: 02/21/2016 16:44   Ct Abdomen Pelvis W Contrast  02/21/2016  CLINICAL DATA:  Abdominal pain, nausea, vomiting and diarrhea. Sepsis. Lupus. EXAM: CT ABDOMEN AND PELVIS WITH CONTRAST TECHNIQUE: Multidetector CT imaging of the abdomen and pelvis was performed using the standard protocol following bolus administration of intravenous contrast. CONTRAST:  131m ISOVUE-300 IOPAMIDOL (ISOVUE-300) INJECTION 61% COMPARISON:  12/14/2015 CT abdomen/ pelvis. FINDINGS: Lower chest: Trace bilateral pleural effusions. Pacemaker lead tips are seen in the right atrium, right ventricular apex and coronary sinus. Trace pericardial fluid/thickening. Extensive ground-glass attenuation and peribronchovascular and subpleural reticulation at both lung bases, not appreciably changed. Hepatobiliary: Normal liver with no liver mass. Cholecystectomy. No biliary ductal dilatation. Pancreas: Normal, with no mass or duct dilation. Spleen: Normal size. No mass. Adrenals/Urinary Tract: Normal adrenals. Stable fullness of the central renal collecting systems bilaterally without overt hydronephrosis. Normal caliber ureters. No contour deforming renal masses. Normal bladder. Stomach/Bowel: Grossly normal stomach. Normal caliber small bowel with no small bowel wall thickening. Normal appendix. Oral contrast progresses to the distal small bowel. Normal large bowel with no diverticulosis, large bowel wall thickening or pericolonic fat stranding. Vascular/Lymphatic: Normal caliber abdominal aorta. Patent portal, splenic, hepatic and renal veins. No pathologically enlarged lymph nodes in the abdomen or pelvis. Reproductive: Grossly normal uterus.  No adnexal mass. Other: No pneumoperitoneum. Trace free fluid in the pelvic cul-de-sac. No focal  intra-abdominal fluid collections. Musculoskeletal: No aggressive appearing focal osseous lesions. Stable mild T11 compression deformity. IMPRESSION: 1. No  evidence of bowel obstruction or acute bowel inflammation. Normal appendix. 2. 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 Electronically Signed   By: Ilona Sorrel M.D.   On: 02/21/2016 19:29   I have personally reviewed and evaluated these images and lab results as part of my medical decision-making.   EKG Interpretation None      MDM   Final diagnoses:  Sepsis, due to unspecified organism ([place])     CRITICAL CARE Performed by: Tanna Furry JOSEPH   Total critical care time: 60 minutes  Critical care time was exclusive of separately billable procedures and treating other patients.  Critical care was necessary to treat or prevent imminent or life-threatening deterioration.  Critical care was time spent personally by me on the following activities: development of treatment plan with patient and/or surrogate as well as nursing, discussions with consultants, evaluation of patient's response to treatment, examination of patient, obtaining history from patient or surrogate, ordering and performing treatments and interventions, ordering and review of laboratory studies, ordering and review of radiographic studies, pulse oximetry and re-evaluation of patient's condition.  Febrile and hypotensive. DP 82/65. Sepsis protocol initiated. We'll give stress dose hydrocortisone fluids, antibiotics, CT abdomen and chest x-ray, lactate, blood cultures, Check C. difficile, close reevaluations.  20:48:  Patient continues to [facility] well. Blood pressures varying between 22/63/3354 systolic. Throughout her most recent hospitalization she had  similar blood pressures. I'm not convinced this represents an absolute manifestation of sepsis. Her CT of her abdomen shows no acute upper maladies. Has been given 30 mL/kg of IV fluid and Zosyn IV. Has had less diarrhea. No additional vomiting. C. difficile pending. I discussed the case with family medicine resident. I do feel the patient is stable for transfer to [facility] for her admitting physician from her recent hospitalization up until yesterday are available.    Tanna Furry, MD 02/21/16 2049

## 2016-02-21 NOTE — ED Notes (Signed)
Per EMS- patient c/o N/V/D today. Patient vomited x 1 prior to arrival to the ED. Patient was wearing home O2 upon arrival to the patient's home.

## 2016-02-22 ENCOUNTER — Inpatient Hospital Stay (HOSPITAL_COMMUNITY): Payer: Medicare Other

## 2016-02-22 DIAGNOSIS — R6510 Systemic inflammatory response syndrome (SIRS) of non-infectious origin without acute organ dysfunction: Secondary | ICD-10-CM | POA: Diagnosis present

## 2016-02-22 DIAGNOSIS — A419 Sepsis, unspecified organism: Secondary | ICD-10-CM

## 2016-02-22 LAB — COMPREHENSIVE METABOLIC PANEL
ALT: 28 U/L (ref 14–54)
AST: 54 U/L — AB (ref 15–41)
Albumin: 1.7 g/dL — ABNORMAL LOW (ref 3.5–5.0)
Alkaline Phosphatase: 48 U/L (ref 38–126)
Anion gap: 11 (ref 5–15)
BUN: 12 mg/dL (ref 6–20)
CO2: 25 mmol/L (ref 22–32)
CREATININE: 0.58 mg/dL (ref 0.44–1.00)
Calcium: 7.9 mg/dL — ABNORMAL LOW (ref 8.9–10.3)
Chloride: 104 mmol/L (ref 101–111)
GFR calc Af Amer: >60 mL/min (ref >60)
GLUCOSE: 207 mg/dL — AB (ref 65–99)
Potassium: 4 mmol/L (ref 3.5–5.1)
Sodium: 140 mmol/L (ref 135–145)
TOTAL PROTEIN: 5.5 g/dL — AB (ref 6.5–8.1)
Total Bilirubin: 0.7 mg/dL (ref 0.3–1.2)

## 2016-02-22 LAB — CBC
HEMATOCRIT: 30 % — AB (ref 36.0–46.0)
Hemoglobin: 9 g/dL — ABNORMAL LOW (ref 12.0–15.0)
MCH: 25.4 pg — ABNORMAL LOW (ref 26.0–34.0)
MCHC: 30 g/dL (ref 30.0–36.0)
MCV: 84.7 fL (ref 78.0–100.0)
PLATELETS: 310 10*3/uL (ref 150–400)
RBC: 3.54 MIL/uL — ABNORMAL LOW (ref 3.87–5.11)
RDW: 18 % — AB (ref 11.5–15.5)
WBC: 13.7 10*3/uL — AB (ref 4.0–10.5)

## 2016-02-22 LAB — GLUCOSE, CAPILLARY
GLUCOSE-CAPILLARY: 307 mg/dL — AB (ref 65–99)
Glucose-Capillary: 181 mg/dL — ABNORMAL HIGH (ref 65–99)
Glucose-Capillary: 138 mg/dL — ABNORMAL HIGH (ref 65–99)
Glucose-Capillary: 301 mg/dL — ABNORMAL HIGH (ref 65–99)

## 2016-02-22 LAB — TROPONIN I
TROPONIN I: 0.04 ng/mL — AB (ref <0.031)
TROPONIN I: 0.03 ng/mL (ref <0.031)

## 2016-02-22 LAB — CK: Total CK: 2032 U/L — ABNORMAL HIGH (ref 38–234)

## 2016-02-22 MED ORDER — SODIUM CHLORIDE 0.9 % IV SOLN
INTRAVENOUS | Status: DC
  Administered 2016-02-22 (×2): via INTRAVENOUS

## 2016-02-22 MED ORDER — GLUCERNA SHAKE PO LIQD: 237.0000 mL | Freq: Two times a day (BID) | ORAL | Status: DC

## 2016-02-22 MED ORDER — FAMOTIDINE 20 MG PO TABS
20.0000 mg | ORAL_TABLET | Freq: Two times a day (BID) | ORAL | Status: DC
  Administered 2016-02-22 – 2016-02-23 (×4): 20 mg via ORAL
  Filled 2016-02-22 (×4): qty 1

## 2016-02-22 MED ORDER — MYCOPHENOLATE MOFETIL 250 MG PO CAPS
1000.0000 mg | ORAL_CAPSULE | Freq: Two times a day (BID) | ORAL | Status: DC
Start: 2016-02-22 — End: 2016-02-22
  Filled 2016-02-22: qty 4

## 2016-02-22 MED ORDER — INSULIN ASPART 100 UNIT/ML ~~LOC~~ SOLN
0.0000 [IU] | Freq: Three times a day (TID) | SUBCUTANEOUS | Status: DC
  Administered 2016-02-22: 2 [IU] via SUBCUTANEOUS
  Administered 2016-02-22: 1 [IU] via SUBCUTANEOUS
  Administered 2016-02-22: 7 [IU] via SUBCUTANEOUS

## 2016-02-22 MED ORDER — ALBUTEROL SULFATE (2.5 MG/3ML) 0.083% IN NEBU: 2.5000 mg | INHALATION_SOLUTION | RESPIRATORY_TRACT | Status: DC | PRN

## 2016-02-22 MED ORDER — POLYETHYLENE GLYCOL 3350 17 G PO PACK: 17.0000 g | PACK | Freq: Every day | ORAL | Status: DC | PRN

## 2016-02-22 MED ORDER — MYCOPHENOLATE MOFETIL 250 MG PO CAPS
500.0000 mg | ORAL_CAPSULE | Freq: Two times a day (BID) | ORAL | Status: DC
  Administered 2016-02-22 – 2016-02-26 (×9): 500 mg via ORAL
  Filled 2016-02-22 (×10): qty 2

## 2016-02-22 MED ORDER — ACETAMINOPHEN 650 MG RE SUPP: 650.0000 mg | Freq: Four times a day (QID) | RECTAL | Status: DC | PRN

## 2016-02-22 MED ORDER — MOMETASONE FURO-FORMOTEROL FUM 100-5 MCG/ACT IN AERO
2.0000 | INHALATION_SPRAY | Freq: Two times a day (BID) | RESPIRATORY_TRACT | Status: DC
  Administered 2016-02-22 – 2016-02-26 (×8): 2 via RESPIRATORY_TRACT
  Filled 2016-02-22 (×2): qty 8.8

## 2016-02-22 MED ORDER — PREDNISONE 10 MG PO TABS
30.0000 mg | ORAL_TABLET | Freq: Two times a day (BID) | ORAL | Status: DC
  Administered 2016-02-22 – 2016-02-26 (×9): 30 mg via ORAL
  Filled 2016-02-22 (×9): qty 1

## 2016-02-22 MED ORDER — ACETAMINOPHEN 325 MG PO TABS
650.0000 mg | ORAL_TABLET | Freq: Four times a day (QID) | ORAL | Status: DC | PRN
  Administered 2016-02-23 – 2016-02-25 (×3): 650 mg via ORAL
  Filled 2016-02-22 (×3): qty 2

## 2016-02-22 MED ORDER — MYCOPHENOLATE MOFETIL 500 MG PO TABS
500.0000 mg | ORAL_TABLET | Freq: Two times a day (BID) | ORAL | Status: DC
  Filled 2016-02-22: qty 1

## 2016-02-22 MED ORDER — ENOXAPARIN SODIUM 40 MG/0.4ML ~~LOC~~ SOLN
40.0000 mg | SUBCUTANEOUS | Status: DC
  Administered 2016-02-22 – 2016-02-26 (×5): 40 mg via SUBCUTANEOUS
  Filled 2016-02-22 (×5): qty 0.4

## 2016-02-22 NOTE — Progress Notes (Signed)
[facility]  Patient Status: Active (receiving services up to time of hospitalization)  [facility] is providing the following services: RN and PT  If patient discharges after hours, please call [phone].   Makayla Vasquez 02/22/2016, 9:49 AM

## 2016-02-22 NOTE — Evaluation (Signed)
Physical Therapy Evaluation Patient Details Name: Makayla Vasquez MRN: [id number] DOB: 05-Jul-1981 Today's Date: 02/22/2016   History of Present Illness  Makayla Vasquez is a 35 y.o. year-old female admitted on 02/21/2016. The patient is currently on day 2 of Zosyn for r/u sepsis vs. Addisonian crisis. Currently unable to fully rule out infection. WBC 13.7. Afebrile. Renal fx stable.   Clinical Impression  Pt admitted with above diagnosis. Pt currently with functional limitations due to the deficits listed below (see PT Problem List). Pt was able to get OOB and ambulate some with PT with min guard assist and some instability noted. Pt does not have 24 hour care and has difficulty per pt report wheeling her manual wheelchair around home.  Requests an electric wheelchair that is portable and will fold to put in her trunk.  MD: if you agree that pt needs electric  Wheelchair to incr mobility in home and community as pt with poor endurance, then please request from insurance company.  Will follow acutely.    Pt will benefit from skilled PT to increase their independence and safety with mobility to allow discharge to the venue listed below.      Follow Up Recommendations Home health PT;Supervision for mobility/OOB    Equipment Recommendations  Other (comment) (pt requests portable electric wheelchair)    Recommendations for Other Services       Precautions / Restrictions Precautions Precautions: Fall Restrictions Weight Bearing Restrictions: No      Mobility  Bed Mobility Overal bed mobility: Modified Independent Bed Mobility: Supine to Sit     Supine to sit: Modified independent (Device/Increase time) Sit to supine: Modified independent (Device/Increase time)   General bed mobility comments: Pt performed from flat surface with heavy reliance on rail,  pt required self assistance to loft B LEs into bed.    Transfers Overall transfer level: Needs assistance Equipment used: Rolling walker  (2 wheeled);None Transfers: Sit to/from Stand Sit to Stand: Supervision         General transfer comment: Pt able to place right sock on but not left sock.  Pt unsteady during movement and required cues for hand placement.  Pt wanted to get on 3N1 therefore assisted pt to 3N1.  Pt had BM.  Asssit to clean pt.    Ambulation/Gait Ambulation/Gait assistance: Min guard;Min assist Ambulation Distance (Feet): 45 Feet Assistive device: Rolling walker (2 wheeled) Gait Pattern/deviations: Step-through pattern;Decreased stride length;Shuffle;Drifts right/left Gait velocity: decreased Gait velocity interpretation: Below normal speed for age/gender General Gait Details: Patient with slow gait. Pt required cues for turning and backing to maintain safety.  Pt definitely needed to use hands for support with gait.    Stairs            Wheelchair Mobility    Modified Rankin (Stroke Patients Only)       Balance Overall balance assessment: Needs assistance;History of Falls Sitting-balance support: No upper extremity supported;Feet supported Sitting balance-Leahy Scale: Good     Standing balance support: During functional activity;Single extremity supported Standing balance-Leahy Scale: Fair Standing balance comment: can stand statically without UE support.              High level balance activites: Direction changes;Turns;Backward walking High Level Balance Comments: min guard assist for stability with challenges             Pertinent Vitals/Pain Pain Assessment: No/denies painVSSwith sats 95% on 4LO2.      Home Living Family/patient expects to be discharged to:: Private  residence Living Arrangements: Spouse/significant other;Children Available Help at Discharge: Family;Available PRN/intermittently Type of Home: Apartment Home Access: Stairs to enter Entrance Stairs-Rails: Right Entrance Stairs-Number of Steps: 2 Home Layout: One level Home Equipment: Walker - 2  wheels;Shower seat;Wheelchair - manual (home O2) Additional Comments: boyfriend works first shift per pt so she will be alone at times.    Prior Function Level of Independence: Independent with assistive device(s);Needs assistance   Gait / Transfers Assistance Needed: Reports she has been walking with no assistive device versus RW.  Uses w/c at times for housekeeping.  ADL's / Homemaking Assistance Needed: Patient independent with bathing, dressing and does housekeeping from w/c. Boyfriend assists with meals and housekeeping.  Comments: Playing with son. getting on the fllor.     Hand Dominance   Dominant Hand: Right    Extremity/Trunk Assessment   Upper Extremity Assessment: Defer to OT evaluation           Lower Extremity Assessment: RLE deficits/detail;LLE deficits/detail RLE Deficits / Details: grossly 3/5 LLE Deficits / Details: grossly 3-/5  Cervical / Trunk Assessment: Normal  Communication   Communication: No difficulties  Cognition Arousal/Alertness: Awake/alert Behavior During Therapy: WFL for tasks assessed/performed Overall Cognitive Status: Within Functional Limits for tasks assessed                      General Comments      Exercises General Exercises - Lower Extremity Ankle Circles/Pumps: AROM;Both;10 reps;Seated Long Arc Quad: AROM;Both;10 reps;Seated      Assessment/Plan    PT Assessment Patient needs continued PT services  PT Diagnosis Difficulty walking;Generalized weakness;Acute pain   PT Problem List Decreased strength;Decreased activity tolerance;Decreased balance;Decreased mobility;Decreased knowledge of use of DME;Decreased safety awareness;Decreased knowledge of precautions  PT Treatment Interventions DME instruction;Gait training;Functional mobility training;Therapeutic activities;Therapeutic exercise;Balance training;Patient/family education   PT Goals (Current goals can be found in the Care Plan section) Acute Rehab PT  Goals Patient Stated Goal: to go home PT Goal Formulation: With patient Time For Goal Achievement: 03/07/16 Potential to Achieve Goals: Good    Frequency Min 3X/week   Barriers to discharge Decreased caregiver support      Co-evaluation               End of Session Equipment Utilized During Treatment: Gait belt;Oxygen Activity Tolerance: Patient limited by fatigue;Patient limited by pain Patient left: in bed;with call bell/phone within reach;with bed alarm set Nurse Communication: Mobility status         Time: 5852-7782 PT Time Calculation (min) (ACUTE ONLY): 30 min   Charges:   PT Evaluation $PT Eval Moderate Complexity: 1 Procedure PT Treatments $Gait Training: 8-22 mins   PT G CodesBerline Lopes Mar 11, 2016, 3:48 PM [employer] Acute Rehabilitation [phone] (pager)

## 2016-02-22 NOTE — H&P (Signed)
[facility] Admission History and Physical Service Pager: [phone]  Patient name: Makayla Vasquez Medical record number: [id number] Date of birth: 1981/02/04 Age: 35 y.o. Gender: female  Primary Care Provider: Nobie Putnam, DO Consultants: None Code Status: Full  Chief Complaint: nausea, vomiting, diarrhea, abdominal pain, coughing  Assessment and Plan: [name] is a 35 y.o. female presenting with nausea, vomiting, diarrhea, abdominal pain, and cough. Concern for sepsis. PMH is significant for HFrEF (EF 40-45%), T2DM, congenital AV block with pace maker, and interstitial lung disease on chronic prednisone and immunosuppressive therapy.   SIRS/Sepsis: Patient recently discharged from [facility] on 5/3 for suspected myositis (possibly statin induced vs steroid induced) and was initially thought to be septic. Was seen in [place] ED on evening of 5/4 for nausea, vomiting, diarrhea, abdominal pain, shortness of breath and cough. There were concerns for sepsis as patient was febrile, hypotensive, and tachypneic. Qsofa score of 2. Patient continues to be hypotensive after appropriate volume resuscitation (Total of 2.75 L of NS bolus given in ED - 42m/kg with additional boluses). Of note, patient did not take any of her medication yesterday, including Prednisone which could've caused her BP to drop even further. Lactic acid initially 2.21 but now normal and no other evidence of end-organ damage. Per Epic chart review, patient appears to have documented hypotension at baseline. In terms of looking for a source of infection, could possibly have pyelonephritis as UA is has small leukocytes and many bacteria; however dirty catch with 6-30 squamous epithelial cells. + suprapubic and diffuse low back pain on exam, no dysuria. Patient does have a history of severe pyelonephritis. Potential pulmonary infectious process although unlikely as productive cough and need for  continuous home O2 is chronic. CXR without focal consolidation however does show possible interstitial edema. Potential GI infectious process as patient has had nausea, vomiting, and diarrhea x 1 day. Likely viral gastroenteritis vs C. Diff. Given her high dose and prolonged use, her abdominal pain, nausea, and vomiting could also be secondary to stopping prednisone as well. CT abdomen showing no acute bowel inflammation and normal appendix. No suspicion of cholecystitis as patient has had her gallbladder removed. Patient is currently afebrile and has leukocytosis of 14.2. Zosyn initiated in ED due to concerns for intraabdominal pathology.  - Admit to [facility] attending Dr. CErin Hearing- Vital signs per floor protocol - Continue IV Zosyn (5/4 -  ) - F/u blood and urine cx - Enteric Precautions - C. Diff PCR - GI Path panel  - Ova and parasite test - Will hold off on Zofran as QTc is prolonged at 480 on most recent EKG - mIVF until PO intake improves - Continuous cardiac monitoring and pulse ox - Supplemental O2 - Tylenol PRN for fever - AM CBC and CMP  Elevated CK/Myalgias/LE Weakness: Had suspected viral myositis vs statin induced myopathy vs steroid induced myopathy at last hospitalization with TSH wnl. CK was high then but downtrended after statin was removed. Was sent home with instructions not to continue statin. Has also had this similar presentation in the past for suspected steroid induced myositis and went to CIR. This hospitalization, patient had initial Troponin of 0.09 and CK of 3265.  - Trend Troponins - AM CK - IVF as above - PT/OT consulted - continue to hold statin - patient may benefit from an EMG in the future.   HFrEF (EF 40-45%): CXR with signs of possible pulmonary edema although lung exam is unremarkable  for crackles. No clinical signs of fluid overload.  - Daily weights and strict I/O - Holding home Bisprolol, Cozaar, and Lasix in the setting of hypotension    Interstitial Lung Disease, on chronic prednisone and immunosuppressive therapy: On 4L O2 at home  - supplemental O2, to keep O2 > 92% - continue home Cellcept 500 mg BID, per Pulm during last admission, after acute infection this should be titrated up as outpatient to wean off prednisone  -diuresis when BP will tolerate after sepsis resolves  -s/p Hydrocortisone 100 mg IV x1, then prednisone 30 mg PO BID  - cont Dulera BID and Albuterol PRN  - Will discuss titrating home Prednisone down when patient is stable  T2DM: CBG 119 on admission. At rehab discharge in March, patient was instructed to d/c insulin use as had episodes of hypoglycemia as steroid dose was lowered.  -hold home metformin  -sensitive SSI  -CBGs AC/HS  -anticipate CBGs to rise with stress dose steroids   Congenital AVN Block s/p atrial pacer (placed at age 47yo):  - on telemetry    FEN/GI: full liquid diet until GI symptoms imprve, then advance Prophylaxis: Lovenox  Disposition: Admit to FPTS  History of Present Illness:  Makayla Vasquez is a 35 y.o. female presenting with cough, nausea, vomiting, diarrhea, and abdominal pain.  Patient states that she began "coughing up yellow stuff" yesterday. No runny nose, sore throat, fever, but was just "wasn't feeling good". Per patient the "yellow stuff" she was coughing up is typical for her but yesterday seemed to be more pronounced. No hemoptysis.   Additionally yesterday afternoon she began having nausea, vomiting and diarrhea. Had 5 loose and runny stools. No hematochezia. Had emesis x 3 described as "orange liquid". Poor appetite, has not had any PO intake for the last 24 hours.   Of note, patient missed all of her medications yesterday, including Prednisone, because she wasn't feeling good. Her legs still hurt and feel weak about the same as when she was recently in the hospital. Continues to have the same amount of dyspnea as she does at baseline. She had a  cough at her last admission for which she was recently discharged, she notes her cough and sputum production now is stable from then (however at last admission denied sputum production).   Denies headache, dizziness, chest pain, swelling, numbness or tingling, dysuria, different urine colors or odors, or flank pain , vaginal discharge, pruritus. Some dyspsnea but not more than usual.  In the [facility] ED she was noted to be hypotensive to 83/37, tachypenic in the 30s, and febrile to 100.7. Due to concerns for intraabdominal pathology a CT of the abdomen/pelvis was performed and negative. She received > 30cc/kg fluid bolus and started on Zosyn. She was also given a dose of hydrocortisone.    Review Of Systems: Per HPI with the following additions: see HPI Otherwise the remainder of the systems were negative.  Patient Active Problem List   Diagnosis Date Noted  . Sepsis ([place]) 02/16/2016  . Cough   . Myalgia and myositis   . Interstitial lung disease ([place])   . Steroid-induced hyperglycemia   . Slow transit constipation   . Acute on chronic systolic CHF (congestive heart failure) ([place])   . Type 2 diabetes mellitus with complication, with long-term current use of insulin ([place])   . Anemia of chronic disease   . Bipolar affective disorder in remission ([place])   . Debility 12/20/2015  . Generalized weakness   .  Lower extremity weakness   . Acute on chronic congestive heart failure ([place])   . AV block   . Acute blood loss anemia   . Supplemental oxygen dependent   . Tachypnea   . 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 Steroid myopathy   . Palpitations   . Complete heart block ([place])   . Cardiac pacemaker in situ   . Diarrhea 12/14/2015  . Viral gastroenteritis 12/14/2015  . Lower limb pain, anterior   . Calf pain   . Adrenal insufficiency ([place])   . Acute on chronic diastolic CHF (congestive heart failure) ([place])   . Arterial hypotension   . SOB (shortness of breath)   . Lupus (systemic lupus  erythematosus) ([place])   . Congenital heart block   . Chronic diastolic congestive heart failure ([place])   . Hypotension 07/08/2015  . Fatigue 06/05/2015  . Nausea with vomiting 06/05/2015  . Hyperlipidemia associated with type 2 diabetes mellitus ([place]) 05/04/2015  . Polyarthralgia 04/26/2015  . Rhinosinusitis 04/19/2015  . Screening for cervical cancer 01/19/2015  . Onychomycosis of right great toe 12/14/2014  . Discoloration of skin 12/01/2014  . Lip abscess 11/02/2014  . CHF (congestive heart failure) ([place])   . Diabetes mellitus type 2 in obese ([place])   . Hyperglycemia 10/24/2014  . Dehydration   . Sinus tachycardia ([place])   . Chronic respiratory failure with hypoxia ([place]) 08/07/2014  . Atrial tachycardia ([place]) 07/26/2014  . Insertion of implantable subdermal contraceptive 07/12/2014  . ILD (interstitial lung disease) ([place]) 06/19/2014  . Pulmonary infiltrates 06/17/2014  . Dyspnea 06/16/2014  . Hypoxemia 06/16/2014  . Congestive dilated cardiomyopathy ([place]) 05/31/2014  . Nonallergic rhinitis 05/22/2014  . Vaginal itching 05/22/2014  . Shortness of breath 05/08/2014  . Sterilization consult 04/20/2014  . Breast tenderness 01/18/2014  . Carpal tunnel syndrome 09/10/2012  . Constipation 03/03/2012  . Chronic systolic heart failure ([place]) 02/26/2012  . Chest pain, unspecified 12/29/2011  . Post-nasal drip 10/24/2011  . Contraception 01/29/2011  . Heart palpitations   . PPM-Medtronic   . Congenital complete AV block   . Bipolar affective disorder ([place])   . Obesity   . GERD (gastroesophageal reflux disease)   . Hypertension 01/05/2011  . THYROGLOSSAL DUCT CYST 07/18/2010  . UNSPECIFIED CARDIAC DYSRHYTHMIA 05/07/2010  . RH FACTOR, NEGATIVE 04/08/2010  . Mild intermittent asthma without complication 85/11/7739  . 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 HEART BLOCK 12/17/2006  . IRRITABLE BOWEL SYNDROME 12/17/2006    Past Medical History: Past Medical History  Diagnosis Date  .  Cardiac pacemaker     a. Since age 61 in 75. b. Upgrade to BiV in 2013.  . Congenital complete AV block   . Obesity   . GERD (gastroesophageal reflux disease)   . Asymptomatic LV dysfunction     a. Echo in Dec 2011 with EF 35 to 40%. Felt to be due to paced rhythm. b. EF 25-30% in 07/2014.  . Seizures ([place])     as a child- from high fever  . Anxiety   . Bipolar affective disorder ([place])   . Depression     bipolar  . Carpal tunnel syndrome of right wrist   . Asthma     seasonal allergies   . Arthritis     rheumatoid arthritis- mild, no rheumatology care   . Hypertension   . Pneumonitis     a. a/w hypoxia - inflammatory - large workup 07/2014.  [name] Sinus tachycardia ([place])   . Diabetes mellitus  without complication ([place])   . Presence of permanent cardiac pacemaker   . CHF (congestive heart failure) ([place])   . Lupus ([place])   . Lupus (systemic lupus erythematosus) ([place])     Past Surgical History: Past Surgical History  Procedure Laterality Date  . Throat surgery  1994    s/p laser treatment  . Cesarean section    . Insert / replace / remove pacemaker      2001  . Cholecystectomy    . Iud removal  11/03/2011    Procedure: INTRAUTERINE DEVICE (IUD) REMOVAL;  Surgeon: [name], MD;  Location: [facility];  Service: Gynecology;  Laterality: N/A;  . Cyst excision  12/10/2012    THYROID  . Thyroglossal duct cyst N/A 12/10/2012    Procedure: REVISION OF THYROGLOSSAL DUCT CYST EXCISION;  Surgeon: [name], MD;  Location: [facility];  Service: ENT;  Laterality: N/A;  Revision of Thyroglossal Duct Cyst Excision  . Nasal fracture surgery      /w plate   . Carpal tunnel with cubital tunnel Right 07/26/2013    Procedure: RIGHT LIMITED OPEN CARPAL TUNNEL RELEASE ,  RIGHT CUBITAL TUNNEL RELEASE, INSITU VERSES ULNAR NERVE DECOMPRESSION AND ANTERIOR TRANSPOSITION;  Surgeon: [name], MD;  Location: [facility];  Service: Orthopedics;  Laterality: Right;  . Video bronchoscopy Bilateral 06/19/2014     Procedure: VIDEO BRONCHOSCOPY WITHOUT FLUORO;  Surgeon: Brand Males, MD;  Location: [place] [facility];  Service: Cardiopulmonary;  Laterality: Bilateral;  . Bi-ventricular pacemaker upgrade N/A 03/08/2012    Procedure: BI-VENTRICULAR PACEMAKER UPGRADE;  Surgeon: [name], MD;  Location: [facility] CATH LAB;  Service: Cardiovascular;  Laterality: N/A;  . Atrial tach ablation N/A 08/14/2014    Procedure: ATRIAL TACH ABLATION;  Surgeon: [name], MD;  Location: [facility] CATH LAB;  Service: Cardiovascular;  Laterality: N/A;  . Right heart catheterization N/A 10/26/2014    Procedure: RIGHT HEART CATH;  Surgeon: [name], MD;  Location: [facility] CATH LAB;  Service: Cardiovascular;  Laterality: N/A;    Social History: Social History  Substance Use Topics  . Smoking status: Former Smoker -- 0.25 packs/day for .5 years    Types: Cigarettes    Quit date: 07/25/1996  . Smokeless tobacco: Never Used  . Alcohol Use: No   Please also refer to relevant sections of EMR.  Family History: Family History  Problem Relation Age of Onset  . Heart disease Mother     CHF (no details)  . Hypertension Mother   . Heart disease Father     Murmur  . Heart disease Sister 53     No details.  History of a pacemaker  . Lupus Mother      Allergies and Medications: Allergies  Allergen Reactions  . 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 Sertraline Hcl Hives  . Tape Other (See Comments)    Burns skin   No current facility-administered medications on file prior to encounter.   Current Outpatient Prescriptions on File Prior to Encounter  Medication Sig Dispense Refill  . albuterol (PROVENTIL HFA;VENTOLIN HFA) 108 (90 BASE) MCG/ACT inhaler Inhale 2 puffs into the lungs every 4 (four) hours as needed for wheezing or shortness of breath. 1 each 1  . bisoprolol (ZEBETA) 5 MG tablet Take 0.5 tablets (2.5 mg total) by mouth daily. 15 tablet 6   . budesonide-formoterol (SYMBICORT) 80-4.5 MCG/ACT inhaler Inhale 2 puffs into the lungs 2 (two)  times daily as needed (for shortness of breath). 1 Inhaler 6  . calcium-vitamin D (OSCAL WITH D) 500-200 MG-UNIT per tablet Take 1 tablet by mouth daily with breakfast.     . camphor-menthol (SARNA) lotion Apply topically 2 (two) times daily. (Patient taking differently: Apply 1 application topically 2 (two) times daily as needed for itching. ) 222 mL 0  . etonogestrel (NEXPLANON) 68 MG IMPL implant 1 each by Subdermal route once.    . famotidine (PEPCID) 20 MG tablet Take 1 tablet (20 mg total) by mouth 2 (two) times daily. (Patient taking differently: Take 20 mg by mouth daily. ) 60 tablet 1  . fluocinonide (LIDEX) 0.05 % external solution 1 APPLICATION APPLY ON THE SKIN AS DIRECTED APPLY TO AFFECTED AREAS DAILY ON SCALP AS NEEDED FOR ITCHING.  5  . fluticasone (FLONASE) 50 MCG/ACT nasal spray Place 2 sprays into both nostrils daily. 16 g 6  . furosemide (LASIX) 20 MG tablet Take 20 mg by mouth daily.  5  . loratadine (CLARITIN) 10 MG tablet Take 1 tablet (10 mg total) by mouth daily. (Patient taking differently: Take 10 mg by mouth daily as needed. ) 30 tablet 11  . losartan (COZAAR) 25 MG tablet Take 0.5 tablets (12.5 mg total) by mouth daily. 15 tablet 6  . metFORMIN (GLUCOPHAGE) 1000 MG tablet TAKE 1 TABLET BY MOUTH TWICE A DAY WITH A MEAL 30 tablet 11  . mycophenolate (CELLCEPT) 500 MG tablet Take 500 mg by mouth 2 (two) times daily.    . polyethylene glycol (MIRALAX / GLYCOLAX) packet Take 17 g by mouth daily after supper. (Patient taking differently: Take 17 g by mouth daily as needed for mild constipation. ) 14 each 0  . predniSONE (DELTASONE) 10 MG tablet Take 30 mg by mouth 2 (two) times daily.    [name] senna-docusate (SENOKOT-S) 8.6-50 MG tablet Take 3 tablets by mouth 2 (two) times daily. (Patient taking differently: Take 3 tablets by mouth 2 (two) times daily as needed for mild constipation. )  100 tablet 0  . Blood Glucose Monitoring Suppl (ONE TOUCH ULTRA 2) w/Device KIT Check blood sugar before meals and at bedtim 1 each 0  . glucose blood (ONE TOUCH ULTRA TEST) test strip Check blood sugar daily before breakfast 100 each 0    Objective: BP 90/59 mmHg  Pulse 77  Temp(Src) 97.2 F (36.2 C) (Rectal)  Resp 33  Ht 5' 5"  (1.651 m)  Wt 158 lb 15.2 oz (72.1 kg)  BMI 26.45 kg/m2  SpO2 99% Exam: General: lying in bed, NAD, non-toxic appearing, very sleepy Eyes: EOMI. PERRL.  ENTM: Oropharynx clear. Poor dentition.  Neck: Full ROM.  Cardiovascular: RRR. No murmur appreciated.  Respiratory: CTAB. Mildly increased WOB. On 4L [place].  Abdomen: +BS, soft, ND, mildly TTP in suprapubic area  MSK: Limited ROM of LE Skin: Warm and dry.  Neuro: Alert and oriented. CN II-XII grossly intact. Sensation in all extremities intact. Strength 5/5 in UE. Strength 3+ in hips bilaterally,  4+/5 in dorsiflexion/planter flexion bilaterally (possible poor effort from patient).   Psych: Flat affect/sleepy. Answers questions and follows commands.   Labs and Imaging: CBC BMET   Recent Labs Lab 02/21/16 1700  WBC 14.2*  HGB 11.2*  HCT 36.7  PLT 369    Recent Labs Lab 02/21/16 1700  NA 137  K 4.1  CL 101  CO2 27  BUN 11  CREATININE 0.64  GLUCOSE 119*  CALCIUM 8.7*    CK 3265  Troponin 0.09  Lactic acid 2.21 >0.89  U/A: small LE, trace Hgb, 6-30 squams  EKG: HR 72, atrial paced, no significant change. QTc 482.    Dg Chest 1 View  02/21/2016  CLINICAL DATA:  Shortness of breath, chest pain, potential sepsis EXAM: CHEST 1 VIEW COMPARISON:  02/16/2016 FINDINGS: Cardiomediastinal silhouette is stable. Three leads cardiac pacemaker is unchanged in position. Central mild vascular congestion and mild interstitial prominence bilaterally suspicious for interstitial edema. No segmental consolidation. IMPRESSION: Three leads cardiac pacemaker is unchanged in position. Central mild vascular  congestion and mild interstitial prominence bilaterally suspicious for interstitial edema. No segmental consolidation. Electronically Signed   By: Lahoma Crocker M.D.   On: 02/21/2016 16:44   Ct Abdomen Pelvis W Contrast  02/21/2016  CLINICAL DATA:  Abdominal pain, nausea, vomiting and diarrhea. Sepsis. Lupus. EXAM: CT ABDOMEN AND PELVIS WITH CONTRAST TECHNIQUE: Multidetector CT imaging of the abdomen and pelvis was performed using the standard protocol following bolus administration of intravenous contrast. CONTRAST:  12m ISOVUE-300 IOPAMIDOL (ISOVUE-300) INJECTION 61% COMPARISON:  12/14/2015 CT abdomen/ pelvis. FINDINGS: Lower chest: Trace bilateral pleural effusions. Pacemaker lead tips are seen in the right atrium, right ventricular apex and coronary sinus. Trace pericardial fluid/thickening. Extensive ground-glass attenuation and peribronchovascular and subpleural reticulation at both lung bases, not appreciably changed. Hepatobiliary: Normal liver with no liver mass. Cholecystectomy. No biliary ductal dilatation. Pancreas: Normal, with no mass or duct dilation. Spleen: Normal size. No mass. Adrenals/Urinary Tract: Normal adrenals. Stable fullness of the central renal collecting systems bilaterally without overt hydronephrosis. Normal caliber ureters. No contour deforming renal masses. Normal bladder. Stomach/Bowel: Grossly normal stomach. Normal caliber small bowel with no small bowel wall thickening. Normal appendix. Oral contrast progresses to the distal small bowel. Normal large bowel with no diverticulosis, large bowel wall thickening or pericolonic fat stranding. Vascular/Lymphatic: Normal caliber abdominal aorta. Patent portal, splenic, hepatic and renal veins. No pathologically enlarged lymph nodes in the abdomen or pelvis. Reproductive: Grossly normal uterus.  No adnexal mass. Other: No pneumoperitoneum. Trace free fluid in the pelvic cul-de-sac. No focal intra-abdominal fluid collections.  Musculoskeletal: No aggressive appearing focal osseous lesions. Stable mild T11 compression deformity. IMPRESSION: 1. No evidence of bowel obstruction or acute bowel inflammation. Normal appendix. 2. 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 Electronically Signed   By: JIlona SorrelM.D.   On: 02/21/2016 19:29     CCarlyle Dolly MD 02/22/2016, 2:52 AM PGY-1, CFrieslandIntern pager: [phone] text pages welcome  Upper Level Addendum:  I have seen and evaluated this patient along with Dr. GJuanito Doomand reviewed the above note, making necessary revisions in purple.   CArchie Patten MD CColdwaterResident, PGY-2

## 2016-02-22 NOTE — Progress Notes (Signed)
Initial Nutrition Assessment  DOCUMENTATION CODES:   Not applicable  INTERVENTION:  -Glucerna BID. Each supplement provides 220 kcals and 10 grams of protein. -Continue to monitor for nutritional needs.    NUTRITION DIAGNOSIS:   Increased nutrient needs related to chronic illness as evidenced by estimated needs.  GOAL:   Patient will meet greater than or equal to 90% of their needs  MONITOR:   PO intake, Supplement acceptance, Labs, Diet advancement, Weight trends, Skin, I & O's  REASON FOR ASSESSMENT:   Malnutrition Screening Tool    ASSESSMENT:   35 y.o. female presenting with nausea, vomiting, diarrhea, abdominal pain, and cough. Concern for sepsis. PMH is significant for HFrEF (EF 40-45%), T2DM, congenital AV block with pace maker, and interstitial lung disease on chronic prednisone and immunosuppressive therapy.   Pt seen for MST.  Pt reports poor appetite PTA and decreased intake over the past 2 months. Per chart, PO is 100%.  Pt has experienced 8.7% weight loss in the past 2.5 months which is significant for time frame but no depletion noted during NFPE.   Pt requests Glucerna. Will order BID.   Labs reviewed; 138-278, Ca 7.9.  Meds reviewed.   Diet Order:  Diet full liquid Room service appropriate?: Yes; Fluid consistency:: Thin  Skin:  Reviewed, no issues  Last BM:  5/4  Height:   Ht Readings from Last 1 Encounters:  02/22/16 5\' 5"  (1.651 m)    Weight:   Wt Readings from Last 1 Encounters:  02/22/16 158 lb 15.2 oz (72.1 kg)    Ideal Body Weight:  56.8 kg  BMI:  Body mass index is 26.45 kg/(m^2).  Estimated Nutritional Needs:   Kcal:  1750- 1950 (25-28 kcal/kg)  Protein:  80-90 (1.2 g/kg)  Fluid:  1.7-1.9 L  EDUCATION NEEDS:   No education needs identified at this time

## 2016-02-22 NOTE — Progress Notes (Signed)
[facility] PCP Visit - Progress Note  I have seen the patient and discussed current hospitalization with [facility]. She seems to be in good spirits but still feels ill now with re-admission within 24 hours. 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 Prior to recent initial hospitalization she had missed a few doses of her chronic Prednisone prior to illness, likely inciting factor, and then again yesterday when she was discharged she stated that she felt too ill to take any of her medications including Prednisone and missed the entire day. Her symptoms with abdominal pain, n/v, hypotension seem consistent as well. However, it is challenging to rule out acute infection as well when presenting. Regarding her chronic prednisone use, other than missed doses, unsure if any further work-up would be beneficial, given tertiary problem should not be deficient in mineralocorticoid (also labs do not reflect this), but perhaps consider plasma renin activity PRA level, also considered potential injury to adrenal glands (noted to be normal on recent CT). Can consider discussing case with Endocrinology to see if perhaps alternative steroid regimen would reduce incidence of Addisonian crises.   I agree with the current hospital plan outlined by the primary team of [facility] Teaching [facility] and want to thank them for their continued excellent care and efforts in caring for my patient. I will anticipate to follow-up with the patient in the clinic after discharge from hospital.  Saralyn Pilar, DO [facility], PGY-3

## 2016-02-22 NOTE — Progress Notes (Signed)
Pharmacy Antibiotic Follow-up Note  Makayla Vasquez is a 35 y.o. year-old female admitted on 02/21/2016.  The patient is currently on day 2 of Zosyn for r/u sepsis vs. Addisonian crisis. Currently unable to fully rule out infection.  WBC 13.7. Afebrile. Renal fx stable.    Assessment/Plan: Zosyn 3.375g IV q8h (4 hour infusion). Pharmacy to sign off as no further dosage adjustments necessary Please consider stopping antibiotics if infection is ruled out.     Temp (24hrs), Avg:98 F (36.7 C), Min:97 F (36.1 C), Max:100.7 F (38.2 C)   Recent Labs Lab 02/16/16 0012 02/16/16 1149 02/17/16 0631 02/21/16 1700 02/22/16 0558  WBC 8.0 6.1 4.4 14.2* 13.7*     Recent Labs Lab 02/16/16 1149 02/17/16 0631 02/18/16 0900 02/21/16 1700 02/22/16 0558  CREATININE 0.46 0.53 0.70 0.64 0.58   Estimated Creatinine Clearance: 97.6 mL/min (by C-G formula based on Cr of 0.58).    Allergies  Allergen Reactions  . 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 Sertraline Hcl Hives  . Tape Other (See Comments)    Burns skin   Antimicrobials this admission: 5/4 Zosyn >>   Microbiology results: 5/4 BCx: sent 5/4 UCx: sent   Thank you for allowing pharmacy to be a part of this patient's care.  Vinnie Level, PharmD., BCPS Clinical Pharmacist Pager [phone]

## 2016-02-23 DIAGNOSIS — R0602 Shortness of breath: Secondary | ICD-10-CM

## 2016-02-23 DIAGNOSIS — I5032 Chronic diastolic (congestive) heart failure: Secondary | ICD-10-CM

## 2016-02-23 LAB — BLOOD CULTURE ID PANEL (REFLEXED)
ACINETOBACTER BAUMANNII: NOT DETECTED
CANDIDA ALBICANS: NOT DETECTED
CARBAPENEM RESISTANCE: NOT DETECTED
Candida glabrata: NOT DETECTED
Candida krusei: NOT DETECTED
Candida parapsilosis: NOT DETECTED
Candida tropicalis: NOT DETECTED
ENTEROBACTER CLOACAE COMPLEX: NOT DETECTED
ENTEROBACTERIACEAE SPECIES: NOT DETECTED
ENTEROCOCCUS SPECIES: NOT DETECTED
Escherichia coli: NOT DETECTED
HAEMOPHILUS INFLUENZAE: NOT DETECTED
Klebsiella oxytoca: NOT DETECTED
Klebsiella pneumoniae: NOT DETECTED
Listeria monocytogenes: NOT DETECTED
METHICILLIN RESISTANCE: DETECTED — AB
NEISSERIA MENINGITIDIS: NOT DETECTED
PSEUDOMONAS AERUGINOSA: NOT DETECTED
Proteus species: NOT DETECTED
STAPHYLOCOCCUS AUREUS BCID: NOT DETECTED
STAPHYLOCOCCUS SPECIES: DETECTED — AB
STREPTOCOCCUS AGALACTIAE: NOT DETECTED
STREPTOCOCCUS PNEUMONIAE: NOT DETECTED
STREPTOCOCCUS SPECIES: NOT DETECTED
Serratia marcescens: NOT DETECTED
Streptococcus pyogenes: NOT DETECTED
VANCOMYCIN RESISTANCE: NOT DETECTED

## 2016-02-23 LAB — GLUCOSE, CAPILLARY
GLUCOSE-CAPILLARY: 394 mg/dL — AB (ref 65–99)
GLUCOSE-CAPILLARY: 231 mg/dL — AB (ref 65–99)
Glucose-Capillary: 367 mg/dL — ABNORMAL HIGH (ref 65–99)
Glucose-Capillary: 344 mg/dL — ABNORMAL HIGH (ref 65–99)
Glucose-Capillary: 252 mg/dL — ABNORMAL HIGH (ref 65–99)

## 2016-02-23 LAB — CBC
HCT: 28.7 % — ABNORMAL LOW (ref 36.0–46.0)
HEMOGLOBIN: 8.7 g/dL — AB (ref 12.0–15.0)
MCH: 25.7 pg — AB (ref 26.0–34.0)
MCHC: 30.3 g/dL (ref 30.0–36.0)
MCV: 84.9 fL (ref 78.0–100.0)
PLATELETS: 317 10*3/uL (ref 150–400)
RBC: 3.38 MIL/uL — ABNORMAL LOW (ref 3.87–5.11)
RDW: 18.3 % — AB (ref 11.5–15.5)
WBC: 6.6 10*3/uL (ref 4.0–10.5)

## 2016-02-23 LAB — CK
Total CK: 878 U/L — ABNORMAL HIGH (ref 38–234)
Total CK: 738 U/L — ABNORMAL HIGH (ref 38–234)

## 2016-02-23 MED ORDER — INSULIN ASPART 100 UNIT/ML ~~LOC~~ SOLN
0.0000 [IU] | Freq: Three times a day (TID) | SUBCUTANEOUS | Status: DC
  Administered 2016-02-23: 11 [IU] via SUBCUTANEOUS
  Administered 2016-02-23: 5 [IU] via SUBCUTANEOUS
  Administered 2016-02-23: 15 [IU] via SUBCUTANEOUS
  Administered 2016-02-24: 8 [IU] via SUBCUTANEOUS
  Administered 2016-02-24: 11 [IU] via SUBCUTANEOUS
  Administered 2016-02-24: 8 [IU] via SUBCUTANEOUS
  Administered 2016-02-25: 15 [IU] via SUBCUTANEOUS
  Administered 2016-02-25: 8 [IU] via SUBCUTANEOUS
  Administered 2016-02-25 – 2016-02-26 (×2): 11 [IU] via SUBCUTANEOUS
  Administered 2016-02-26: 15 [IU] via SUBCUTANEOUS
  Administered 2016-02-26: 11 [IU] via SUBCUTANEOUS

## 2016-02-23 MED ORDER — INSULIN ASPART 100 UNIT/ML ~~LOC~~ SOLN
5.0000 [IU] | Freq: Once | SUBCUTANEOUS | Status: AC
  Administered 2016-02-23: 5 [IU] via SUBCUTANEOUS

## 2016-02-23 MED ORDER — PIPERACILLIN-TAZOBACTAM 3.375 G IVPB
3.3750 g | Freq: Three times a day (TID) | INTRAVENOUS | Status: DC
  Administered 2016-02-23 – 2016-02-24 (×3): 3.375 g via INTRAVENOUS
  Filled 2016-02-23 (×4): qty 50

## 2016-02-23 MED ORDER — FUROSEMIDE 40 MG PO TABS
20.0000 mg | ORAL_TABLET | Freq: Every day | ORAL | Status: DC
  Administered 2016-02-23 – 2016-02-26 (×4): 20 mg via ORAL
  Filled 2016-02-23 (×4): qty 1

## 2016-02-23 MED ORDER — INSULIN ASPART 100 UNIT/ML ~~LOC~~ SOLN
0.0000 [IU] | Freq: Every day | SUBCUTANEOUS | Status: DC
  Administered 2016-02-23: 2 [IU] via SUBCUTANEOUS
  Administered 2016-02-24: 3 [IU] via SUBCUTANEOUS
  Administered 2016-02-25: 2 [IU] via SUBCUTANEOUS

## 2016-02-23 MED ORDER — BISOPROLOL FUMARATE 5 MG PO TABS
5.0000 mg | ORAL_TABLET | Freq: Every day | ORAL | Status: DC
  Administered 2016-02-23 – 2016-02-26 (×4): 5 mg via ORAL
  Filled 2016-02-23 (×4): qty 1

## 2016-02-23 NOTE — Progress Notes (Addendum)
Pharmacy Antibiotic Follow-up Note  Makayla Vasquez is a 35 y.o. year-old female admitted on 02/21/2016.  The patient is currently on day 2 of Zosyn for r/o sepsis vs. Addisonian crisis. WBC 13.7. Afebrile. Renal fx stable.   Lab called reporting BCID which shows 1/2 blood cultures growing CONS with mec A (methicillin resistance gene).  **Original report that was released in micro had Staph Aureus detected, no methicillin resistance. However after calling micro back, they said that original call was correct and the report has been updated to reflect this**  Assessment/Plan: Zosyn 3.375g IV q8h (4 hour infusion).  BCID results were discussed with Dr. Jonathon Jordan and since pt remains afeb and culture results could be contamination, decision was made to hold on starting Vancomycin for now.   Temp (24hrs), Avg:97.6 F (36.4 C), Min:97.3 F (36.3 C), Max:98.3 F (36.8 C)   Recent Labs Lab 02/16/16 1149 02/17/16 0631 02/21/16 1700 02/22/16 0558  WBC 6.1 4.4 14.2* 13.7*     Recent Labs Lab 02/16/16 1149 02/17/16 0631 02/18/16 0900 02/21/16 1700 02/22/16 0558  CREATININE 0.46 0.53 0.70 0.64 0.58   Estimated Creatinine Clearance: 97.6 mL/min (by C-G formula based on Cr of 0.58).    Allergies  Allergen Reactions  . 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 Sertraline Hcl Hives  . Tape Other (See Comments)    Burns skin   Antimicrobials this admission: 5/4 Zosyn >>   Microbiology results: 5/4 BCx: sent 5/4 UCx: sent   Thank you for allowing pharmacy to be a part of this patient's care.  Vinnie Level, PharmD., BCPS Clinical Pharmacist Pager [phone]

## 2016-02-23 NOTE — Progress Notes (Signed)
Family Medicine Teaching Service Daily Progress Note Intern Pager: [phone]  Patient name: Makayla Vasquez Medical record number: [id number] Date of birth: 09/28/1981 Age: 35 y.o. Gender: female  Primary Care Provider: Saralyn Pilar, DO Consultants: None Code Status: Full   Pt Overview and Major Events to Date:  5/5: Admitted to FPTS  Assessment and Plan: JMYA ULIANO is a 35 y.o. female presenting with nausea, vomiting, diarrhea, abdominal pain, and cough. Concern for sepsis. PMH is significant for HFrEF (EF 40-45%), T2DM, congenital AV block with pace maker, and interstitial lung disease on chronic prednisone and immunosuppressive therapy.   SIRS/Sepsis: Patient no longer meeting sepsis criteria and GI symptoms have resolved. Has been afebrile since admission with stable vitals, continues to have slightly elevated respiratory rates though in the 20's. Stable on IV Zosyn. WBC 6.6 now. Given her high dose and prolonged use of steroids, her symptoms were likely secondary to stopping prednisone. Urine cx too young to read still. 1 of 2 blood cx grew MRSA.  - Vital signs per floor protocol - DC IV Zosyn (5/4 -5/6)  - Discussed positive MRSA in blood cx with the team, decision was made start Vancomycin per pharmacy for bacteremia - F/u urine cx - Repeat blood cx tomorrow - Enteric Precautions  - C. Diff PCR, GI Path panel, and Ova and parasite tests still need to be collected - DC mIVF as PO intake is back to baseline - Continuous cardiac monitoring and pulse ox - Tylenol PRN for fever  Elevated CK/Myalgias/LE Weakness: Had suspected viral myositis vs statin induced myopathy vs steroid induced myopathy at last hospitalization with TSH wnl. CK was high then but downtrended after statin was removed. Was sent home with instructions not to continue statin. Has also had this similar presentation in the past for suspected steroid induced myositis and went to CIR. This hospitalization,  patient had initial Troponin of 0.09 and CK of 3265. Troponin and CK down trending, now 878. 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  - PT/OT consulted: HH PT, supervision for mobility/OOB - continue to hold statin - Continue to trend CK - patient may benefit from an EMG in the future.  - Will need to set patient up with a rheumatologist who can address possible idiopathic inflammatory myopathies  HFrEF (EF 40-45%): CXR with signs of possible pulmonary edema although lung exam is unremarkable for crackles. No clinical signs of fluid overload. Has gained 8 lbs from 5/4 to 5/5.  - Daily weights and strict I/O - Holding home Cozaar to avoid hypotension  - Resume home Bisoprolol and Lasix today and consider resuming Cozaar later today or tomorrow if BP remains normal  Interstitial Lung Disease, on chronic prednisone and immunosuppressive therapy: On 4L O2 at home. Attempted to call patient's rhematologist, Dr. Elmer Ramp. Office stated they have never seen her before.  - supplemental O2, to keep O2 > 92% - continue home Cellcept 500 mg BID, per Pulm during last admission, after acute infection this should be titrated up as outpatient to wean off prednisone  -diuresis when BP will tolerate - Prednisone 30 mg PO BID  - cont Dulera BID and Albuterol PRN  - Will discuss titrating home Prednisone down    T2DM:. At rehab discharge in March, patient was instructed to d/c insulin use as had episodes of hypoglycemia as steroid dose was lowered.  -hold home metformin  -Change to moderate SSI  as CBGs have been in the high 300s -CBGs AC/HS  -anticipate CBGs to rise with stress dose steroids   Congenital AVN Block s/p atrial pacer (placed at age 65yo):  - on telemetry   FEN/GI: full liquid diet until GI symptoms improve, then advance Prophylaxis: Lovenox  Disposition: continue to monitor   Subjective:  Patient still  having significant leg pain today but other than that, no complaints. Appetite is back to baseline, she even ordered a pizza last night. No nausea, vomiting, and diarrhea.   Objective: Temp:  [97.2 F (36.2 C)-98.3 F (36.8 C)] 97.2 F (36.2 C) (05/06 0412) Pulse Rate:  [59-96] 60 (05/06 0600) Resp:  [7-45] 28 (05/06 0600) BP: (84-113)/(36-67) 113/67 mmHg (05/06 0400) SpO2:  [93 %-100 %] 98 % (05/06 0600) Physical Exam: General: lying in bed, NAD, non-toxic appearing, very awake this morning and pleasant Cardiovascular: RRR. No murmur appreciated Respiratory: CTAB. Mildly increased WOB. On 4L .  Abdomen: +BS, soft, ND, non tender to palpation  Extremities: no edema Neuro: Alert and oriented. Sensation in all extremities intact. Strength 5/5 in UE. Strength 3+ in hips bilaterally, 4+/5 in dorsiflexion/planter flexion bilaterally   Laboratory:  Recent Labs Lab 02/21/16 1700 02/22/16 0558 02/23/16 0612  WBC 14.2* 13.7* 6.6  HGB 11.2* 9.0* 8.7*  HCT 36.7 30.0* 28.7*  PLT 369 310 317    Recent Labs Lab 02/17/16 0631 02/18/16 0900 02/21/16 1700 02/22/16 0558  NA 138 141 137 140  K 3.1* 4.6 4.1 4.0  CL 106 111 101 104  CO2 24 22 27 25   BUN 8 10 11 12   CREATININE 0.53 0.70 0.64 0.58  CALCIUM 8.1* 8.2* 8.7* 7.9*  PROT 5.5*  --  6.4* 5.5*  BILITOT 0.8  --  0.8 0.7  ALKPHOS 42  --  59 48  ALT 20  --  30 28  AST 40  --  74* 54*  GLUCOSE 170* 248* 119* 207*    Imaging/Diagnostic Tests: Ct Chest W Contrast  02/22/2016  CLINICAL DATA:  Chest tightness with shortness of breath and fatigue EXAM: CT CHEST WITH CONTRAST TECHNIQUE: Multidetector CT imaging of the chest was performed during intravenous contrast administration. CONTRAST:  75 mL Isovue-300 nonionic COMPARISON:  Chest CT December 10, 2015; Feb 21, 2016 FINDINGS: Mediastinum/Lymph Nodes: Thyroid is mildly prominent without focal demonstrable lesion. There are several mildly prominent lymph nodes. There is a focal  right peritracheal lymph node measuring 1.7 x 1.2 cm. There are several prominent left paratracheal lymph nodes, largest measuring 2.0 x 1.3 cm. There is an enlarged right hilar lymph node measuring 2.5 x 1.7 cm. There is a sub- carinal lymph node measuring 2.2 x 1.7 cm. There is mild cardiomegaly. There is no appreciable pericardial thickening. Pacemaker leads are attached to the right atrium, right ventricle, and left ventricle. There is no appreciable thoracic aortic aneurysm or dissection. No major vessel pulmonary embolus is evident on this study. Visualized great vessels appear unremarkable. Note that the left and right common carotid arteries arise as a common trunk, an anatomic variant. Lungs/Pleura: There is diffuse interstitial and patchy alveolar opacity throughout the lungs bilaterally. There is considerably less edema currently compared to the prior study from 2.5 months prior. There is a well-defined nodular opacity in the posterior segment of the left lower lobe measuring 5 x 5 mm which may have been obscured by edema on the prior study. Note that there is lower lobe bronchiectatic change bilaterally. Upper abdomen: There are probable parapelvic  cysts in the left kidney; left kidney incompletely visualized. Gallbladder absent. Visualized upper abdominal structures otherwise appear unremarkable. Musculoskeletal: There is a limbus type vertebra at T10. No blastic or lytic bone lesions. IMPRESSION: Interstitial and patchy alveolar opacity, felt to most likely represent pulmonary edema. There is cardiomegaly. There is felt to be a degree of congestive heart failure. Note that a degree of superimposed pneumonia cannot be excluded on this study. Adenopathy which may well be reactive in etiology. Gallbladder absent. Electronically Signed   By: Bretta Bang III M.D.   On: 02/22/2016 17:13    Beaulah Dinning, MD 02/23/2016, 6:59 AM PGY-1, [facility] FPTS Intern pager: [phone],  text pages welcome

## 2016-02-24 DIAGNOSIS — R7881 Bacteremia: Secondary | ICD-10-CM | POA: Diagnosis present

## 2016-02-24 LAB — BASIC METABOLIC PANEL
ANION GAP: 12 (ref 5–15)
Anion gap: 11 (ref 5–15)
BUN: 11 mg/dL (ref 6–20)
BUN: 8 mg/dL (ref 6–20)
CO2: 22 mmol/L (ref 22–32)
CO2: 24 mmol/L (ref 22–32)
Calcium: 8.6 mg/dL — ABNORMAL LOW (ref 8.9–10.3)
Calcium: 8.7 mg/dL — ABNORMAL LOW (ref 8.9–10.3)
Chloride: 104 mmol/L (ref 101–111)
Chloride: 101 mmol/L (ref 101–111)
Creatinine, Ser: 0.6 mg/dL (ref 0.44–1.00)
Creatinine, Ser: 0.7 mg/dL (ref 0.44–1.00)
GFR calc Af Amer: >60 mL/min (ref >60)
GFR calc Af Amer: >60 mL/min (ref >60)
GFR calc non Af Amer: >60 mL/min (ref >60)
GLUCOSE: 403 mg/dL — AB (ref 65–99)
Glucose, Bld: 333 mg/dL — ABNORMAL HIGH (ref 65–99)
POTASSIUM: 4.1 mmol/L (ref 3.5–5.1)
Potassium: 4.6 mmol/L (ref 3.5–5.1)
Sodium: 137 mmol/L (ref 135–145)
Sodium: 137 mmol/L (ref 135–145)

## 2016-02-24 LAB — IRON AND TIBC
Iron: 80 ug/dL (ref 28–170)
Saturation Ratios: 41 % — ABNORMAL HIGH (ref 10.4–31.8)
TIBC: 193 ug/dL — ABNORMAL LOW (ref 250–450)
UIBC: 113 ug/dL

## 2016-02-24 LAB — CBC
HCT: 23.2 % — ABNORMAL LOW (ref 36.0–46.0)
HEMOGLOBIN: 6.9 g/dL — AB (ref 12.0–15.0)
MCH: 25.7 pg — ABNORMAL LOW (ref 26.0–34.0)
MCHC: 29.7 g/dL — ABNORMAL LOW (ref 30.0–36.0)
MCV: 86.6 fL (ref 78.0–100.0)
Platelets: 392 10*3/uL (ref 150–400)
RBC: 2.68 MIL/uL — AB (ref 3.87–5.11)
RDW: 18.3 % — ABNORMAL HIGH (ref 11.5–15.5)
WBC: 7.4 10*3/uL (ref 4.0–10.5)

## 2016-02-24 LAB — GLUCOSE, CAPILLARY
GLUCOSE-CAPILLARY: 295 mg/dL — AB (ref 65–99)
GLUCOSE-CAPILLARY: 290 mg/dL — AB (ref 65–99)
Glucose-Capillary: 346 mg/dL — ABNORMAL HIGH (ref 65–99)
Glucose-Capillary: 280 mg/dL — ABNORMAL HIGH (ref 65–99)

## 2016-02-24 LAB — RETICULOCYTES
RBC.: 3.6 MIL/uL — ABNORMAL LOW (ref 3.87–5.11)
Retic Count, Absolute: 90 10*3/uL (ref 19.0–186.0)
Retic Ct Pct: 2.5 % (ref 0.4–3.1)

## 2016-02-24 LAB — HEMOGLOBIN AND HEMATOCRIT, BLOOD
HCT: 38.3 % (ref 36.0–46.0)
Hemoglobin: 11.3 g/dL — ABNORMAL LOW (ref 12.0–15.0)

## 2016-02-24 LAB — VITAMIN B12: VITAMIN B 12: 212 pg/mL (ref 180–914)

## 2016-02-24 LAB — PREPARE RBC (CROSSMATCH)

## 2016-02-24 LAB — FOLATE: Folate: 6.9 ng/mL (ref >5.9)

## 2016-02-24 LAB — FERRITIN: Ferritin: 658 ng/mL — ABNORMAL HIGH (ref 11–307)

## 2016-02-24 LAB — CK: CK TOTAL: 525 U/L — AB (ref 38–234)

## 2016-02-24 MED ORDER — PANTOPRAZOLE SODIUM 40 MG PO TBEC
40.0000 mg | DELAYED_RELEASE_TABLET | Freq: Every day | ORAL | Status: DC
  Administered 2016-02-24 – 2016-02-26 (×3): 40 mg via ORAL
  Filled 2016-02-24 (×3): qty 1

## 2016-02-24 MED ORDER — LOSARTAN POTASSIUM 25 MG PO TABS
12.5000 mg | ORAL_TABLET | Freq: Every day | ORAL | Status: DC
  Administered 2016-02-24 – 2016-02-26 (×3): 12.5 mg via ORAL
  Filled 2016-02-24 (×3): qty 0.5

## 2016-02-24 MED ORDER — SODIUM CHLORIDE 0.9 % IV SOLN
Freq: Once | INTRAVENOUS | Status: AC
  Administered 2016-02-24: 12:00:00 via INTRAVENOUS

## 2016-02-24 MED ORDER — VANCOMYCIN HCL IN DEXTROSE 750-5 MG/150ML-% IV SOLN
750.0000 mg | Freq: Three times a day (TID) | INTRAVENOUS | Status: DC
  Administered 2016-02-24: 750 mg via INTRAVENOUS
  Filled 2016-02-24 (×2): qty 150

## 2016-02-24 NOTE — Progress Notes (Signed)
Family Medicine Teaching Service Daily Progress Note Intern Pager: [phone]  Patient name: Makayla Vasquez Medical record number: [id number] Date of birth: 01-Aug-1981 Age: 35 y.o. Gender: female  Primary Care Provider: Saralyn Pilar, DO Consultants: None Code Status: Full   Pt Overview and Major Events to Date:  5/5: Admitted to FPTS  Assessment and Plan: Makayla Vasquez is a 35 y.o. female presenting with nausea, vomiting, diarrhea, abdominal pain, and cough. Concern for sepsis. PMH is significant for HFrEF (EF 40-45%), T2DM, congenital AV block with pace maker, and interstitial lung disease on chronic prednisone and immunosuppressive therapy.   SIRS/Sepsis: Initiially concerned with presenting symptoms / vitals. She has stabilized, though grew out staph species resistant to Methicillin. Report was corrected and it is NOT Staph aureus. No leukocytosis, afebrile. Species of 1/2 initial cultures pending. Repeat blood cultures also pending.  - Vital signs per floor protocol - Zosyn changed back to Vanc.  - Urine Cx 40K GNR.  - Enteric Precautions  - C. Diff PCR, GI Path panel, and Ova and parasite tests still need to be collected. No stool yet.  - DC mIVF as PO intake is back to baseline - Continuous cardiac monitoring and pulse ox - Tylenol PRN if fever.   Anemia - Chronically Anemic Hgb 9-10. Has been downtrending the last two days. No melena or BRBPR. Less likely dilutional given 2 pt. Drop since yesterday. Pt. Asymptomatic at this time.  - Iron panel.  - B12, Folate.  - Transfuse 1 unit this am.  - F/U PM H/H.  - HemOccult stool card.  - Risk for GI ulceration / bleeding with prednisone.  - Start PPI, has been on pepcid.   Elevated CK/Myalgias/LE Weakness: Had suspected viral myositis vs statin induced myopathy vs steroid induced myopathy at last hospitalization with TSH wnl. CK was high then but downtrended after statin was removed. Was sent home with instructions not to  continue statin. Has also had this similar presentation in the past for suspected steroid induced myositis and went to CIR. This hospitalization, patient had initial Troponin of 0.09 and CK of 3265. Troponin and CK down trending, now 878. 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  - PT/OT consulted: HH PT, supervision for mobility/OOB - continue to hold statin - Continue to trend CK - Referral to Rheum Specialist on D/C with regard to Rheum Myositis workup.   HFrEF (EF 40-45%): CXR with signs of possible pulmonary edema although lung exam is unremarkable for crackles. No clinical signs of fluid overload. Has gained 3Lbs up from 5/5-5/6. +600cc - Daily weights and strict I/O - Resume Cozaar.  - Resume home Bisoprolol and Lasix  Interstitial Lung Disease, on chronic prednisone and immunosuppressive therapy: On 4L O2 at home. Attempted to call patient's rhematologist, Dr. Elmer Ramp. Office stated they have never seen her before.  - supplemental O2, to keep O2 > 92% - continue home Cellcept 500 mg BID, per Pulm during last admission, after acute infection this should be titrated up as outpatient to wean off prednisone  - cont Dulera BID and Albuterol PRN  - prednisone 30mg  BID.   T2DM:. At rehab discharge in March, patient was instructed to d/c insulin use as had episodes of hypoglycemia as steroid dose was lowered.  -hold home metformin  -Change to moderate SSI as CBGs have been in the high 300s -CBGs AC/HS   Congenital AVN Block s/p atrial  pacer (placed at age 26yo):  - on telemetry   FEN/GI: full liquid diet until GI symptoms improve, then advance Prophylaxis: Lovenox  Disposition: continue to monitor   Subjective:  Feeling the same this morning. Not worse with anemai, no SOB, dizziness, or feeling cold. Explained findings at length and goals for today. She has not had any further, nausea, diarrhea. Some slight  abdominal pain. No LE edema.   Objective: Temp:  [97.3 F (36.3 C)-98.4 F (36.9 C)] 97.6 F (36.4 C) (05/07 0800) Pulse Rate:  [59-60] 60 (05/07 0800) Resp:  [11-40] 29 (05/07 0800) BP: (122-140)/(65-80) 137/74 mmHg (05/07 0800) SpO2:  [94 %-100 %] 99 % (05/07 0800) Weight:  [161 lb 13.1 oz (73.4 kg)] 161 lb 13.1 oz (73.4 kg) (05/06 1200) Physical Exam: General: lying in bed, NAD, non-toxic appearing, laying flat in bed.  Cardiovascular: RRR. No murmur appreciated Respiratory: CTAB. Appropriate rate, unlabored. O2 3.5L Abdomen: +BS, soft, ND, non tender to palpation  Extremities: Trace, LE edema.  Neuro: Alert and oriented. Sensation in all extremities intact. Strength 5/5 in UE. Strength 3+ in hips bilaterally, 4+/5 in dorsiflexion/planter flexion bilaterally   Laboratory:  Recent Labs Lab 02/22/16 0558 02/23/16 0612 02/24/16 0601  WBC 13.7* 6.6 7.4  HGB 9.0* 8.7* 6.9*  HCT 30.0* 28.7* 23.2*  PLT 310 317 392    Recent Labs Lab 02/21/16 1700 02/22/16 0558 02/24/16 0601  NA 137 140 137  K 4.1 4.0 4.6  CL 101 104 104  CO2 27 25 22   BUN 11 12 11   CREATININE 0.64 0.58 0.60  CALCIUM 8.7* 7.9* 8.6*  PROT 6.4* 5.5*  --   BILITOT 0.8 0.7  --   ALKPHOS 59 48  --   ALT 30 28  --   AST 74* 54*  --   GLUCOSE 119* 207* 333*   Blood Cx 5/4  1/2 - + for Staph Species (corrected from staph aureus)- It is Methicillin resistant.   Blood Cx 5/7 - pending.   Anti Jo +   Imaging/Diagnostic Tests: No results found.  , MD 02/24/2016, 8:52 AM PGY-2,  [facility] FPTS Intern pager: [phone], text pages welcome

## 2016-02-24 NOTE — Evaluation (Signed)
Occupational Therapy Evaluation Patient Details Name: Makayla Vasquez MRN: [id number] DOB: 1981/06/14 Today's Date: 02/24/2016    History of Present Illness Makayla Vasquez is a 35 y.o. year-old female admitted on 02/21/2016. The patient is currently on day 2 of Zosyn for r/u sepsis vs. Addisonian crisis. Currently unable to fully rule out infection. WBC 13.7. Afebrile. Renal fx stable.    Clinical Impression   Pt admitted with above. She demonstrates the below listed deficits and will benefit from continued OT to maximize safety and independence with BADLs.  Pt with self limiting behaviors.   She requires assist with ADL and reports she required variable amount of assist PTA due to weakness and fatigue.  She does not have 24 hour assist.  She may benefit from [facility] at discharge.       Follow Up Recommendations  Home health OT;Supervision/Assistance - 24 hour ; HHa[facility].    Equipment Recommendations  None recommended by OT    Recommendations for Other Services       Precautions / Restrictions Precautions Precautions: Fall      Mobility Bed Mobility Overal bed mobility: Modified Independent                Transfers                 General transfer comment: Pt refused     Balance                                            ADL Overall ADL's : Needs assistance/impaired Eating/Feeding: Independent   Grooming: Wash/dry hands;Wash/dry face;Oral care;Brushing hair;Set up;Bed level   Upper Body Bathing: Set up;Bed level   Lower Body Bathing: Minimal assistance;Bed level                         General ADL Comments: Pt adamantly refused OOB activity.  She initially agreed x 3 to get OOB, but each time therapist initiated activity, pt stated "this is why I don't like PT/OT"  "It's too early, I don't get up until after 12"     Vision     Perception     Praxis      Pertinent Vitals/Pain Pain Assessment: No/denies pain      Hand Dominance Right   Extremity/Trunk Assessment Upper Extremity Assessment Upper Extremity Assessment: Generalized weakness   Lower Extremity Assessment Lower Extremity Assessment: Defer to PT evaluation   Cervical / Trunk Assessment Cervical / Trunk Assessment: Normal   Communication Communication Communication: No difficulties   Cognition Arousal/Alertness: Awake/alert Behavior During Therapy: WFL for tasks assessed/performed Overall Cognitive Status: Within Functional Limits for tasks assessed                     General Comments       Exercises       Shoulder Instructions      Home Living Family/patient expects to be discharged to:: Private residence Living Arrangements: Spouse/significant other;Children Available Help at Discharge: Family;Available PRN/intermittently Type of Home: Apartment Home Access: Stairs to enter Entrance Stairs-Number of Steps: 2 Entrance Stairs-Rails: Right Home Layout: One level     Bathroom Shower/Tub: Chief Strategy Officer: Standard Bathroom Accessibility: Yes   Home Equipment: Environmental consultant - 2 wheels;Wheelchair - manual;Tub bench   Additional Comments: boyfriend works first shift per pt so  she will be alone at times.      Prior Functioning/Environment Level of Independence: Needs assistance  Gait / Transfers Assistance Needed: Pt reports she is able to ambulate household distances, but at times becomes fatigued requiring use of w/c  ADL's / Homemaking Assistance Needed: Pt reports that she requires assist intermittently _ 50% of the time due to weakness and fatigue Communication / Swallowing Assistance Needed: no difficulties      OT Diagnosis: Generalized weakness   OT Problem List: Decreased strength;Decreased activity tolerance;Impaired balance (sitting and/or standing);Decreased safety awareness;Decreased knowledge of use of DME or AE;Cardiopulmonary status limiting activity   OT  Treatment/Interventions: Self-care/ADL training;Therapeutic exercise;DME and/or AE instruction;Therapeutic activities;Patient/family education;Balance training    OT Goals(Current goals can be found in the care plan section) Acute Rehab OT Goals Patient Stated Goal: to go home OT Goal Formulation: With patient Time For Goal Achievement: 03/02/16 Potential to Achieve Goals: Good ADL Goals Pt Will Perform Grooming: with modified independence;standing Pt Will Perform Upper Body Bathing: with modified independence;sitting Pt Will Perform Lower Body Bathing: with modified independence;sit to/from stand Pt Will Perform Upper Body Dressing: with modified independence;sitting Pt Will Perform Lower Body Dressing: with modified independence;sit to/from stand Pt Will Transfer to Toilet: with modified independence;ambulating;regular height toilet;bedside commode;grab bars Pt Will Perform Toileting - Clothing Manipulation and hygiene: with modified independence;sit to/from stand Pt Will Perform Tub/Shower Transfer: Tub transfer;with modified independence;ambulating;tub bench;rolling walker  OT Frequency: Min 2X/week   Barriers to D/C: Decreased caregiver support          Co-evaluation              End of Session Equipment Utilized During Treatment: Oxygen  Activity Tolerance: Other (comment) (self limiting behaviors ) Patient left: in bed;with call bell/phone within reach   Time: 1023-1035 OT Time Calculation (min): 12 min Charges:  OT General Charges $OT Visit: 1 Procedure OT Evaluation $OT Eval Moderate Complexity: 1 Procedure G-Codes:    Charnita Trudel M 03/01/16, 11:18 AM

## 2016-02-24 NOTE — Progress Notes (Signed)
ANTIBIOTIC CONSULT NOTE - INITIAL  Pharmacy Consult for Vancomycin Indication: bacteremia  Allergies  Allergen Reactions  . 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 Sertraline Hcl Hives  . Tape Other (See Comments)    Burns skin    Patient Measurements: Height: 5\' 5"  (165.1 cm) Weight: 161 lb 13.1 oz (73.4 kg) IBW/kg (Calculated) : 57 Adjusted Body Weight:   Vital Signs: Temp: 97.6 F (36.4 C) (05/07 0800) Temp Source: Oral (05/07 0800) BP: 137/74 mmHg (05/07 0800) Pulse Rate: 60 (05/07 0800) Intake/Output from previous day: 05/06 0701 - 05/07 0700 In: 600 [P.O.:600] Out: 1200 [Urine:1200] Intake/Output from this shift:    Labs:  Recent Labs  02/21/16 1700 02/22/16 0558 02/23/16 0612 02/24/16 0601  WBC 14.2* 13.7* 6.6 7.4  HGB 11.2* 9.0* 8.7* 6.9*  PLT 369 310 317 392  CREATININE 0.64 0.58  --  0.60   Estimated Creatinine Clearance: 98.5 mL/min (by C-G formula based on Cr of 0.6). No results for input(s): VANCOTROUGH, VANCOPEAK, VANCORANDOM, GENTTROUGH, GENTPEAK, GENTRANDOM, TOBRATROUGH, TOBRAPEAK, TOBRARND, AMIKACINPEAK, AMIKACINTROU, AMIKACIN in the last 72 hours.   Microbiology: Recent Results (from the past 720 hour(s))  Culture, blood (Routine x 2)     Status: None   Collection Time: 02/16/16 12:12 AM  Result Value Ref Range Status   Specimen Description BLOOD LEFT WRIST  Final   Special Requests BOTTLES DRAWN AEROBIC AND ANAEROBIC  Final   Culture   Final    NO GROWTH 5 DAYS Performed at [facility]    Report Status 02/21/2016 FINAL  Final  Culture, blood (Routine x 2)     Status: None   Collection Time: 02/16/16 12:27 AM  Result Value Ref Range Status   Specimen Description BLOOD RIGHT WRIST  Final   Special Requests BOTTLES DRAWN AEROBIC AND ANAEROBIC  Final   Culture   Final    NO GROWTH 5 DAYS Performed at [facility]    Report Status 02/21/2016  FINAL  Final  Urine culture     Status: None   Collection Time: 02/16/16  2:05 AM  Result Value Ref Range Status   Specimen Description URINE, CLEAN CATCH  Final   Special Requests NONE  Final   Culture   Final    NO GROWTH 1 DAY Performed at [facility]    Report Status 02/17/2016 FINAL  Final  Respiratory virus panel     Status: None   Collection Time: 02/16/16  5:37 AM  Result Value Ref Range Status   Respiratory Syncytial Virus A Negative Negative Final   Respiratory Syncytial Virus B Negative Negative Final   Influenza A Negative Negative Final   Influenza B Negative Negative Final   Parainfluenza 1 Negative Negative Final   Parainfluenza 2 Negative Negative Final   Parainfluenza 3 Negative Negative Final   Metapneumovirus Negative Negative Final   Rhinovirus Negative Negative Final   Adenovirus Negative Negative Final    Comment: (NOTE) Performed At: [facility] [address] [place], [place] [id number] Mila Homer MD QM:[id number]   MRSA PCR Screening     Status: Abnormal   Collection Time: 02/16/16  8:45 AM  Result Value Ref Range Status   MRSA by PCR POSITIVE (A) NEGATIVE Final    Comment:        The GeneXpert MRSA Assay (FDA approved for NASAL specimens only), is one component of a comprehensive MRSA  colonization surveillance program. It is not intended to diagnose MRSA infection nor to guide or monitor treatment for MRSA infections. RESULT CALLED TO, READ BACK BY AND VERIFIED WITH: SHERRY DOTY RN AT 1637 02/16/16 BY WOOLLENK   Blood Culture (routine x 2)     Status: None (Preliminary result)   Collection Time: 02/21/16  5:00 PM  Result Value Ref Range Status   Specimen Description BLOOD LEFT ANTECUBITAL  Final   Special Requests BOTTLES DRAWN AEROBIC AND ANAEROBIC 5 CC EA  Final   Culture  Setup Time   Final    GRAM POSITIVE COCCI IN CLUSTERS AEROBIC BOTTLE ONLY Organism ID to follow CRITICAL RESULT CALLED TO, READ BACK BY AND  VERIFIED WITH: KAREN AMEND @0333  02/23/16 MKELLY    Culture   Final    TOO YOUNG TO READ Performed at [facility]    Report Status PENDING  Incomplete  Blood Culture ID Panel (Reflexed)     Status: Abnormal   Collection Time: 02/21/16  5:00 PM  Result Value Ref Range Status   Enterococcus species NOT DETECTED NOT DETECTED Final   Vancomycin resistance NOT DETECTED NOT DETECTED Final   Listeria monocytogenes NOT DETECTED NOT DETECTED Final   Staphylococcus species DETECTED (A) NOT DETECTED Corrected    Comment: CORRECTED RESULTS CALLED TO07/04/17 ARMEND,PHARM D AT 0741 02/23/16 BY M KELLY. RESULTS PREVIOUSLY REPORTED AS STAPH AUREUS CORRECTED ON 05/06 AT 07/06: PREVIOUSLY REPORTED AS DETECTED CORRECTED RESULTS CALLED [id number] D AT Frederich Chick 02/23/16 BY M KELLY. RESULTS PREVIOUSLY REPORTED AS STAPH AUREUS, CORRECTED ON 05/06 AT 0747: PREVIOUSLY REPORTED AS NOT DETECTED    Staphylococcus aureus NOT DETECTED NOT DETECTED Corrected    Comment: CORRECTED ON 05/06 AT 0747: PREVIOUSLY REPORTED AS DETECTED KAREN AMEND @0335  02/23/16 MKELLY   Methicillin resistance DETECTED (A) NOT DETECTED Corrected    Comment: CRITICAL RESULT CALLED TO, READ BACK BY AND VERIFIED WITH: C AMEND,PHARM D AT 0741 02/23/16 BY M KELLY CORRECTED ON 05/06 AT 04/24/16: PREVIOUSLY REPORTED AS DETECTED, CORRECTED ON 05/06 AT 0747: PREVIOUSLY REPORTED AS NOT DETECTED    Streptococcus species NOT DETECTED NOT DETECTED Final   Streptococcus agalactiae NOT DETECTED NOT DETECTED Final   Streptococcus pneumoniae NOT DETECTED NOT DETECTED Final   Streptococcus pyogenes NOT DETECTED NOT DETECTED Final   Acinetobacter baumannii NOT DETECTED NOT DETECTED Final   Enterobacteriaceae species NOT DETECTED NOT DETECTED Final   Enterobacter cloacae complex NOT DETECTED NOT DETECTED Final   Escherichia coli NOT DETECTED NOT DETECTED Final   Klebsiella oxytoca NOT DETECTED NOT DETECTED Final   Klebsiella pneumoniae NOT DETECTED NOT  DETECTED Final   Proteus species NOT DETECTED NOT DETECTED Final   Serratia marcescens NOT DETECTED NOT DETECTED Final   Carbapenem resistance NOT DETECTED NOT DETECTED Final   Haemophilus influenzae NOT DETECTED NOT DETECTED Final   Neisseria meningitidis NOT DETECTED NOT DETECTED Final   Pseudomonas aeruginosa NOT DETECTED NOT DETECTED Final   Candida albicans NOT DETECTED NOT DETECTED Final   Candida glabrata NOT DETECTED NOT DETECTED Final   Candida krusei NOT DETECTED NOT DETECTED Final   Candida parapsilosis NOT DETECTED NOT DETECTED Final   Candida tropicalis NOT DETECTED NOT DETECTED Final    Comment: Performed at [facility]  Blood Culture (routine x 2)     Status: None (Preliminary result)   Collection Time: 02/21/16  5:05 PM  Result Value Ref Range Status   Specimen Description BLOOD RIGHT ANTECUBITAL  Final   Special Requests  BOTTLES DRAWN AEROBIC AND ANAEROBIC 5 CC  Final   Culture   Final    NO GROWTH 2 DAYS Performed at [facility]    Report Status PENDING  Incomplete  Urine culture     Status: Abnormal (Preliminary result)   Collection Time: 02/21/16  5:35 PM  Result Value Ref Range Status   Specimen Description URINE, CLEAN CATCH  Final   Special Requests NONE  Final   Culture 40,000 COLONIES/mL GRAM NEGATIVE RODS (A)  Final   Report Status PENDING  Incomplete    Medical History: Past Medical History  Diagnosis Date  . Cardiac pacemaker     a. Since age 71 in 81. b. Upgrade to BiV in 2013.  . Congenital complete AV block   . Obesity   . GERD (gastroesophageal reflux disease)   . Asymptomatic LV dysfunction     a. Echo in Dec 2011 with EF 35 to 40%. Felt to be due to paced rhythm. b. EF 25-30% in 07/2014.  . Seizures (HCC)     as a child- from high fever  . Anxiety   . Bipolar affective disorder (HCC)   . Depression     bipolar  . Carpal tunnel syndrome of right wrist   . Asthma     seasonal allergies   . Arthritis     rheumatoid  arthritis- mild, no rheumatology care   . Hypertension   . Pneumonitis     a. a/w hypoxia - inflammatory - large workup 07/2014.  [name] Sinus tachycardia (HCC)   . Diabetes mellitus without complication (HCC)   . Presence of permanent cardiac pacemaker   . CHF (congestive heart failure) (HCC)   . Lupus (HCC)   . Lupus (systemic lupus erythematosus) (HCC)     Medications:  Scheduled:  . sodium chloride   Intravenous Once  . bisoprolol  5 mg Oral Daily  . enoxaparin (LOVENOX) injection  40 mg Subcutaneous Q24H  . feeding supplement (GLUCERNA SHAKE)  237 mL Oral BID BM  . furosemide  20 mg Oral Daily  . insulin aspart  0-15 Units Subcutaneous TID WC  . insulin aspart  0-5 Units Subcutaneous QHS  . losartan  12.5 mg Oral Daily  . mometasone-formoterol  2 puff Inhalation BID  . mycophenolate  500 mg Oral BID  . pantoprazole  40 mg Oral Daily  . predniSONE  30 mg Oral BID   Assessment: 35yo female with Staph species that is methicillin resistant in 1/2 cx from 5/4.  Pt has been on Zosyn due to presumed contaminant (likely CNS).  Per d/w MD- due to hypotension on admission,  Vancomycin is to be started.  Zosyn was stopped and repeat cx drawn today.  Pt is afebrile, WBC wnl, and normotensive for the last 24hr.  Good UOP.  Goal of Therapy:  Vancomycin trough level 15-20 mcg/ml  Plan:  Vancomycin 750mg  IV q8 F/U cx results Watch renal fxn and check Vanc Trough when appropriate  , PharmD Clinical Pharmacist [facility]

## 2016-02-25 ENCOUNTER — Telehealth: Payer: Self-pay | Admitting: *Deleted

## 2016-02-25 DIAGNOSIS — R531 Weakness: Secondary | ICD-10-CM

## 2016-02-25 DIAGNOSIS — M791 Myalgia: Secondary | ICD-10-CM

## 2016-02-25 DIAGNOSIS — J849 Interstitial pulmonary disease, unspecified: Secondary | ICD-10-CM

## 2016-02-25 DIAGNOSIS — M329 Systemic lupus erythematosus, unspecified: Secondary | ICD-10-CM

## 2016-02-25 DIAGNOSIS — I5032 Chronic diastolic (congestive) heart failure: Secondary | ICD-10-CM | POA: Insufficient documentation

## 2016-02-25 DIAGNOSIS — M609 Myositis, unspecified: Secondary | ICD-10-CM

## 2016-02-25 LAB — CBC
HCT: 33.6 % — ABNORMAL LOW (ref 36.0–46.0)
Hemoglobin: 10 g/dL — ABNORMAL LOW (ref 12.0–15.0)
MCH: 25.3 pg — ABNORMAL LOW (ref 26.0–34.0)
MCHC: 29.8 g/dL — ABNORMAL LOW (ref 30.0–36.0)
MCV: 84.8 fL (ref 78.0–100.0)
PLATELETS: 325 10*3/uL (ref 150–400)
RBC: 3.96 MIL/uL (ref 3.87–5.11)
RDW: 17.5 % — AB (ref 11.5–15.5)
WBC: 9.7 10*3/uL (ref 4.0–10.5)

## 2016-02-25 LAB — TYPE AND SCREEN
ABO/RH(D): O NEG
ANTIBODY SCREEN: NEGATIVE
Unit division: 0

## 2016-02-25 LAB — GLUCOSE, CAPILLARY
GLUCOSE-CAPILLARY: 287 mg/dL — AB (ref 65–99)
Glucose-Capillary: 347 mg/dL — ABNORMAL HIGH (ref 65–99)
Glucose-Capillary: 378 mg/dL — ABNORMAL HIGH (ref 65–99)
Glucose-Capillary: 206 mg/dL — ABNORMAL HIGH (ref 65–99)

## 2016-02-25 LAB — CULTURE, BLOOD (ROUTINE X 2)

## 2016-02-25 LAB — URINE CULTURE

## 2016-02-25 LAB — TROPONIN I: Troponin I: 0.03 ng/mL (ref <0.031)

## 2016-02-25 LAB — CK: Total CK: 556 U/L — ABNORMAL HIGH (ref 38–234)

## 2016-02-25 MED ORDER — PANTOPRAZOLE SODIUM 40 MG PO TBEC: 40.0000 mg | DELAYED_RELEASE_TABLET | Freq: Every day | ORAL | Status: DC

## 2016-02-25 NOTE — Progress Notes (Signed)
10:56 AM Infection Prevention notified on call phone by Molli Hazard in the lab of positive urine culture for Carbapenem Resistant Enterobacteriacae. Randel Pigg, RN and explained the need for strict contact precautions and UV light disinfection upon discharge or room transfer.  Amanda Pea, RN,BSN Infection Prevention

## 2016-02-25 NOTE — Discharge Instructions (Signed)
You were admitted to the hospital for concerns that you had an infection. Likely you did not have an infection. Your symptoms were likely due to not being on Prednisone for a couple days. Please continue to take 30 mg of Prednisone twice daily. Additionally, please go to your follow up appointments listed above.  It was a pleasure caring for you, take care!   Prednisone tablets What is this medicine? PREDNISONE (PRED ni sone) is a corticosteroid. It is commonly used to treat inflammation of the skin, joints, lungs, and other organs. Common conditions treated include asthma, allergies, and arthritis. It is also used for other conditions, such as blood disorders and diseases of the adrenal glands. This medicine may be used for other purposes; ask your health care provider or pharmacist if you have questions. What should I tell my health care provider before I take this medicine? They need to know if you have any of these conditions: -Cushing's syndrome -diabetes -glaucoma -heart disease -high blood pressure -infection (especially a virus infection such as chickenpox, cold sores, or herpes) -kidney disease -liver disease -mental illness -myasthenia gravis -osteoporosis -seizures -stomach or intestine problems -thyroid disease -an unusual or allergic reaction to lactose, prednisone, other medicines, foods, dyes, or preservatives -pregnant or trying to get pregnant -breast-feeding How should I use this medicine? Take this medicine by mouth with a glass of water. Follow the directions on the prescription label. Take this medicine with food. If you are taking this medicine once a day, take it in the morning. Do not take more medicine than you are told to take. Do not suddenly stop taking your medicine because you may develop a severe reaction. Your doctor will tell you how much medicine to take. If your doctor wants you to stop the medicine, the dose may be slowly lowered over time to avoid any  side effects. Talk to your pediatrician regarding the use of this medicine in children. Special care may be needed. Overdosage: If you think you have taken too much of this medicine contact a poison control center or emergency room at once. NOTE: This medicine is only for you. Do not share this medicine with others. What if I miss a dose? If you miss a dose, take it as soon as you can. If it is almost time for your next dose, talk to your doctor or health care professional. You may need to miss a dose or take an extra dose. Do not take double or extra doses without advice. What may interact with this medicine? Do not take this medicine with any of the following medications: -metyrapone -mifepristone This medicine may also interact with the following medications: -aminoglutethimide -amphotericin B -aspirin and aspirin-like medicines -barbiturates -certain medicines for diabetes, like glipizide or glyburide -cholestyramine -cholinesterase inhibitors -cyclosporine -digoxin -diuretics -ephedrine -female hormones, like estrogens and birth control pills -isoniazid -ketoconazole -NSAIDS, medicines for pain and inflammation, like ibuprofen or naproxen -phenytoin -rifampin -toxoids -vaccines -warfarin This list may not describe all possible interactions. Give your health care provider a list of all the medicines, herbs, non-prescription drugs, or dietary supplements you use. Also tell them if you smoke, drink alcohol, or use illegal drugs. Some items may interact with your medicine. What should I watch for while using this medicine? Visit your doctor or health care professional for regular checks on your progress. If you are taking this medicine over a prolonged period, carry an identification card with your name and address, the type and dose of your medicine, and  your doctor's name and address. This medicine may increase your risk of getting an infection. Tell your doctor or health care  professional if you are around anyone with measles or chickenpox, or if you develop sores or blisters that do not heal properly. If you are going to have surgery, tell your doctor or health care professional that you have taken this medicine within the last twelve months. Ask your doctor or health care professional about your diet. You may need to lower the amount of salt you eat. This medicine may affect blood sugar levels. If you have diabetes, check with your doctor or health care professional before you change your diet or the dose of your diabetic medicine. What side effects may I notice from receiving this medicine? Side effects that you should report to your doctor or health care professional as soon as possible: -allergic reactions like skin rash, itching or hives, swelling of the face, lips, or tongue -changes in emotions or moods -changes in vision -depressed mood -eye pain -fever or chills, cough, sore throat, pain or difficulty passing urine -increased thirst -swelling of ankles, feet Side effects that usually do not require medical attention (report to your doctor or health care professional if they continue or are bothersome): -confusion, excitement, restlessness -headache -nausea, vomiting -skin problems, acne, thin and shiny skin -trouble sleeping -weight gain This list may not describe all possible side effects. Call your doctor for medical advice about side effects. You may report side effects to FDA at 1-800-FDA-1088. Where should I keep my medicine? Keep out of the reach of children. Store at room temperature between 15 and 30 degrees C (59 and 86 degrees F). Protect from light. Keep container tightly closed. Throw away any unused medicine after the expiration date. NOTE: This sheet is a summary. It may not cover all possible information. If you have questions about this medicine, talk to your doctor, pharmacist, or health care provider.    2016, [employer]. (2011-05-22 10:57:14)

## 2016-02-25 NOTE — Care Management Important Message (Signed)
Important Message  Patient Details  Name: NELLE SAYED MRN: [id number] Date of Birth: 1981/07/20   Medicare Important Message Given:  Yes    Amiria Orrison T, RN 02/25/2016, 1:53 PM

## 2016-02-25 NOTE — Discharge Summary (Signed)
Makayla Vasquez Discharge Summary  Patient name: Makayla Vasquez Medical record number: [id number] Date of birth: 09-May-1981 Age: 35 y.o. Gender: female Date of Admission: 02/21/2016  Date of Discharge: 02/26/2016 Admitting Physician: Lind Covert, MD  Primary Care Provider: Nobie Putnam, DO Consultants: None  Indication for Hospitalization: concern for sepsis  Discharge Diagnoses/Problem List:  Patient Active Problem List   Diagnosis Date Noted  . Chronic diastolic congestive heart failure ([place])   . Bacteremia 02/24/2016  . Myalgia and myositis   . Type 2 diabetes mellitus with complication, with long-term current use of insulin ([place])   . Anemia of chronic disease   . Bipolar affective disorder in remission ([place])   . Generalized weakness   . Acute blood loss anemia   . Supplemental oxygen dependent   . Cardiac pacemaker in situ   . Adrenal insufficiency ([place])   . Acute on chronic diastolic CHF (congestive heart failure) ([place])   . Lupus (systemic lupus erythematosus) ([place])   . Hypotension 07/08/2015  . Hyperlipidemia associated with type 2 diabetes mellitus ([place]) 05/04/2015  . Screening for cervical cancer 01/19/2015  . CHF (congestive heart failure) ([place])   . Chronic respiratory failure with hypoxia ([place]) 08/07/2014  . Insertion of implantable subdermal contraceptive 07/12/2014  . ILD (interstitial lung disease) ([place]) 06/19/2014  . Congestive dilated cardiomyopathy ([place]) 05/31/2014  . Vaginal itching 05/22/2014  . Chronic systolic heart failure ([place]) 02/26/2012  . PPM-[employer]tr[employer]   . Congenital complete AV block   . GERD (gastroesophageal reflux disease)   . THYROGLOSSAL DUCT CYST 07/18/2010  . RH FACTOR, NEGATIVE 04/08/2010  . Mild intermittent asthma without complication 30/86/5784  . POLYCYSTIC OVARY 12/17/2006    Disposition: Home  Discharge Condition: stable  Discharge Exam: see previous progress note  Brief Vasquez  Course:  Makayla Vasquez is a 35 y.o. female who presented with weakness, cough, and myalgias. There was also concern for sepsis. PMH is significant for HFrEF (EF 40-45%), T2DM, congenital AV block with pace maker, and interstitial lung disease on chronic prednisone and immunosuppressive therapy.   Concerns for Sepsis:  Patient met SIRs criteria on admit. Qsofa score of 2. UA was concerning for infectious process positive nitrites, trace leukocytes, 6-30 WBC, and many bacteria; however dirty catch with 6-30 squamous epithelial cells. CXR without focal consolidation. Patient was afebrile and without leukocytosis. IV Zosyn initiated on 5/4 but was stopped on 5/6 due to low suspicion of infection as patient was afebrile with stable vital signs and no leukocytosis. Urine cx had no growth x 3 days. 1 of 2 blood cx grew MR staph epi (likely contaminant).   Elevated CK/Myalgias/LE Weakness: Had suspected viral myositis vs statin induced myopathy vs steroid induced myopathy at last hospitalization. CK was high then but downtrended after statin was removed. Was sent home with instructions not to continue statin. Has also had this similar presentation in the past for suspected steroid induced myositis and went to CIR. Prior to this hospitalization, patient did not take her 30 mg BID of Prednisone for a couple days. She had an initial Troponin of 0.09 and CK of 3265. Troponin and CK down trended after she was placed back on her steroid and given 24 hours of fluids. 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 PT/OT consulted. Physical therapy recommended home health PT. On day of discharge, patient continued to have lower extremity myalgias. She will  need rheumatology follow up outpatient.   Interstitial Lung Disease, on chronic prednisone and immunosuppressive therapy:  On 4L O2 at home and remained on supplemental O2 at 3-4 L while  hospitalized. Home Cellcept 500 mg BID was continued as well as Dulera BID and Albuterol PRN. Patient's steroids were dosed as above.   All other chronic medical conditions stable throughout admission and managed with home regimens.   Issues for Follow Up:  1. Rheumatology: Will need to follow up with prednisone dose as well as  + anti JO-1 antibodies causing autoimmune myositis. 2. Pulmonology: Will need to follow up with interstitial lung disease. Pulmonologist also may change Prednisone dose. It would be ideal if pulmonologist speaks with rheumatology (and vice versa) as patient has received conflicting messages about her Prednisone dose in the past 3.  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, would suggest not resuming statin therapy  Significant Procedures: none  Significant Labs and Imaging:   Recent Labs Lab 02/23/16 0612 02/24/16 0601 02/24/16 1556 02/25/16 0822 02/26/16 0835  WBC 6.6 7.4  --  9.7  --   HGB 8.7* 6.9* 11.3* 10.0* 11.2*  HCT 28.7* 23.2* 38.3 33.6* 36.8  PLT 317 392  --  325  --     Recent Labs Lab 02/21/16 1700 02/22/16 0558 02/24/16 0601 02/24/16 1106  NA 137 140 137 137  K 4.1 4.0 4.6 4.1  CL 101 104 104 101  CO2 27 25 22 24   GLUCOSE 119* 207* 333* 403*  BUN 11 12 11 8   CREATININE 0.64 0.58 0.60 0.70  CALCIUM 8.7* 7.9* 8.6* 8.7*  ALKPHOS 59 48  --   --   AST 74* 54*  --   --   ALT 30 28  --   --   ALBUMIN 2.4* 1.7*  --   --     No results found.   Results/Tests Pending at Time of Discharge: none  Discharge Medications:    Medication List    TAKE these medications        albuterol 108 (90 Base) MCG/ACT inhaler  Commonly known as:  PROVENTIL HFA;VENTOLIN HFA  Inhale 2 puffs into the lungs every 4 (four) hours as needed for wheezing or shortness of breath.     bisoprolol 5 MG tablet  Commonly known as:  ZEBETA  Take 0.5 tablets (2.5 mg total) by mouth daily.     budesonide-formoterol 80-4.5 MCG/ACT inhaler  Commonly  known as:  SYMBICORT  Inhale 2 puffs into the lungs 2 (two) times daily as needed (for shortness of breath).     calcium-vitamin D 500-200 MG-UNIT tablet  Commonly known as:  OSCAL WITH D  Take 1 tablet by mouth daily with breakfast.     camphor-menthol lotion  Commonly known as:  SARNA  Apply topically 2 (two) times daily.     famotidine 20 MG tablet  Commonly known as:  PEPCID  Take 1 tablet (20 mg total) by mouth 2 (two) times daily.     fluocinonide 0.05 % external solution  Commonly known as:  LIDEX  1 APPLICATION APPLY ON THE SKIN AS DIRECTED APPLY TO AFFECTED AREAS DAILY ON SCALP AS NEEDED FOR ITCHING.     fluticasone 50 MCG/ACT nasal spray  Commonly known as:  FLONASE  Place 2 sprays into both nostrils daily.     furosemide 20 MG tablet  Commonly known as:  LASIX  Take 20 mg by mouth daily.     glucose blood test strip  Commonly known as:  ONE TOUCH ULTRA TEST  Check blood sugar daily before breakfast     loratadine 10 MG tablet  Commonly known as:  CLARITIN  Take 1 tablet (10 mg total) by mouth daily.     losartan 25 MG tablet  Commonly known as:  COZAAR  Take 0.5 tablets (12.5 mg total) by mouth daily.     metFORMIN 1000 MG tablet  Commonly known as:  GLUCOPHAGE  TAKE 1 TABLET BY MOUTH TWICE A DAY WITH A MEAL     mycophenolate 500 MG tablet  Commonly known as:  CELLCEPT  Take 500 mg by mouth 2 (two) times daily.     NEXPLANON 68 MG Impl implant  Generic drug:  etonogestrel  1 each by Subdermal route once.     ONE TOUCH ULTRA 2 w/Device Kit  Check blood sugar before meals and at bedtim     pantoprazole 40 MG tablet  Commonly known as:  PROTONIX  Take 1 tablet (40 mg total) by mouth daily.     polyethylene glycol packet  Commonly known as:  MIRALAX / GLYCOLAX  Take 17 g by mouth daily after supper.     predniSONE 10 MG tablet  Commonly known as:  DELTASONE  Take 30 mg by mouth 2 (two) times daily.     senna-docusate 8.6-50 MG tablet  Commonly  known as:  Senokot-S  Take 3 tablets by mouth 2 (two) times daily.        Discharge Instructions: Please refer to Patient Instructions section of EMR for full details.  Patient was counseled important signs and symptoms that should prompt return to medical care, changes in medications, dietary instructions, activity restrictions, and follow up appointments.   Follow-Up Appointments: Follow-up Information    Follow up with Nobie Putnam, DO On 02/28/2016.   Specialty:  Osteopathic Medicine   Why:  Vasquez follow up at 4:00 PM   Contact information:   [place] [address] [phone]       Follow up with [facility]. Go on 03/04/2016.   Specialty:  Rheumatology   Why:  rheumatology follow up at 3:00 PM   Contact information:   [place]  [address] [phone]       Follow up with [facility] On 03/03/2016.   Specialty:  Pulmonology   Why:  pulmonology follow up at 3:15 PM   Contact information:   [place], MD 02/27/2016, 10:08 PM PGY-1, [place]

## 2016-02-25 NOTE — Progress Notes (Signed)
Family Medicine Teaching Service Daily Progress Note Intern Pager: [phone]  Patient name: Makayla Vasquez Medical record number: [id number] Date of birth: 12-02-1980 Age: 35 y.o. Gender: female  Primary Care Provider: Saralyn Pilar, DO Consultants: None Code Status: Full   Pt Overview and Major Events to Date:  5/5: Admitted to FPTS  Assessment and Plan: Makayla Vasquez is a 35 y.o. female presenting with nausea, vomiting, diarrhea, abdominal pain, and cough. Concern for sepsis. PMH is significant for HFrEF (EF 40-45%), T2DM, congenital AV block with pace maker, and interstitial lung disease on chronic prednisone and immunosuppressive therapy.   SIRS/Sepsis: Initially concerned with presenting symptoms / vitals. She has stabilized, though grew out staph species resistant to Methicillin. Report was corrected and it is NOT Staph aureus. No leukocytosis, afebrile. Species of 1/2 initial cultures pending. Repeat blood NGTD. Zosyn (5/4-5/7), vital signs stable after patient has been off Zosyn for 24 hours.  - Vital signs per floor protocol - Urine Cx 40K E.Coli (sensitive to CTX) and 40K ENTEROBACTER CLOACAE (resitant to CTX). Pt not symptomatic, no need for treatment at this time. - Discharge today  Anemia - Chronically Anemic Hgb 9-10. Has been downtrending the last two days. No melena or BRBPR. Less likely dilutional given 2 pt. Drop since yesterday. Pt. Asymptomatic at this time. B12, Folate normal. Iron panel, Iron normal at 80, TIBC decreased at 193, ferritin high at 658. Was transfused 1 U yesterday. Hgb 10 today - HemOccult stool card.  - Risk for GI ulceration / bleeding with prednisone, continue pepcid.   Elevated CK/Myalgias/LE Weakness: Had suspected viral myositis vs statin induced myopathy vs steroid induced myopathy at last hospitalization with TSH wnl. CK was high then but downtrended after statin was removed. Was sent home with instructions not to continue statin. Has also  had this similar presentation in the past for suspected steroid induced myositis and went to CIR. This hospitalization, patient had initial Troponin of 0.09 and CK of 3265. Troponin and CK down trending, now 878. 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  - PT/OT consulted: HH PT, supervision for mobility/OOB - continue to hold statin - Referral to Rheum Specialist on D/C with regard to Rheum Myositis workup.   HFrEF (EF 40-45%): CXR with signs of possible pulmonary edema although lung exam is unremarkable for crackles. No clinical signs of fluid overload. Has gained 3Lbs up from 5/5-5/6. +600cc - Daily weights and strict I/O - Continue home Cozaar, Bisoprolol and Lasix  Interstitial Lung Disease, on chronic prednisone and immunosuppressive therapy: On 4L O2 at home. Attempted to call patient's rhematologist, Dr. Elmer Ramp. Office stated they have never seen her before.  - supplemental O2, to keep O2 > 92% - continue home Cellcept 500 mg BID, per Pulm during last admission, after acute infection this should be titrated up as outpatient to wean off prednisone  - cont Dulera BID and Albuterol PRN  - prednisone 30mg  BID.   T2DM: At rehab discharge in March, patient was instructed to d/c insulin use as had episodes of hypoglycemia as steroid dose was lowered.  -hold home metformin  -Change to moderate SSI as CBGs have been in the high 300s -CBGs AC/HS   Congenital AVN Block s/p atrial pacer (placed at age 62yo):  - on telemetry   FEN/GI: full liquid diet until GI symptoms improve, then advance Prophylaxis: Lovenox  Disposition: continue to monitor   Subjective:  No chest pain, SOB, dizziness, or feeling cold. She has not had any further, nausea, diarrhea. Still no BM. Continues to have bilateral thigh pain.   Objective: Temp:  [97.5 F (36.4 C)-98.7 F (37.1 C)] 98.3 F (36.8 C) (05/08 0340) Pulse Rate:   [59-98] 82 (05/07 1935) Resp:  [16-29] 25 (05/08 0000) BP: (107-153)/(58-90) 128/77 mmHg (05/08 0000) SpO2:  [93 %-99 %] 96 % (05/08 0000) Physical Exam: General: lying in bed, NAD, non-toxic appearing, laying flat in bed.  Cardiovascular: RRR. No murmur appreciated Respiratory: CTAB. Appropriate rate, unlabored. O2 3.5L Abdomen: +BS, soft, ND, non tender to palpation  Extremities: Trace, LE edema.  Neuro: Alert and oriented. Sensation in all extremities intact. Strength 5/5 in UE. Strength 3+ in hips bilaterally, 4+/5 in dorsiflexion/planter flexion bilaterally   Laboratory:  Recent Labs Lab 02/23/16 0612 02/24/16 0601 02/24/16 1556 02/25/16 0822  WBC 6.6 7.4  --  9.7  HGB 8.7* 6.9* 11.3* 10.0*  HCT 28.7* 23.2* 38.3 33.6*  PLT 317 392  --  325    Recent Labs Lab 02/21/16 1700 02/22/16 0558 02/24/16 0601 02/24/16 1106  NA 137 140 137 137  K 4.1 4.0 4.6 4.1  CL 101 104 104 101  CO2 27 25 22 24   BUN 11 12 11 8   CREATININE 0.64 0.58 0.60 0.70  CALCIUM 8.7* 7.9* 8.6* 8.7*  PROT 6.4* 5.5*  --   --   BILITOT 0.8 0.7  --   --   ALKPHOS 59 48  --   --   ALT 30 28  --   --   AST 74* 54*  --   --   GLUCOSE 119* 207* 333* 403*   Blood Cx 5/4  1/2 - + for Staph Species (corrected from staph aureus)- It is Methicillin resistant.   Blood Cx 5/7 - pending.   Anti Jo +   Imaging/Diagnostic Tests: No results found.  , MD 02/25/2016, 6:47 AM PGY-1, [facility] FPTS Intern pager: [phone], text pages welcome

## 2016-02-25 NOTE — Progress Notes (Signed)
Physical Therapy Treatment Patient Details Name: Makayla Vasquez MRN: [id number] DOB: Apr 24, 1981 Today's Date: 02/25/2016    History of Present Illness Makayla Vasquez is a 35 y.o. year-old female admitted on 02/21/2016. The patient is currently on day 2 of Zosyn for r/u sepsis vs. Addisonian crisis. Currently unable to fully rule out infection. WBC 13.7. Afebrile. Renal fx stable.     PT Comments    Pt admitted with above diagnosis. Pt currently with functional limitations due to balance and endurance deficits. Initially, pt declining to ambulate but with max encouragement, pt agreed.  Pt was able to ambulate in room with O2 at 4L.  Pt unsteady but did not want to use RW.   Poor safety awareness overall and pt instructed to use RW at all times at home.  Pt will benefit from skilled PT to increase their independence and safety with mobility to allow discharge to the venue listed below.    Follow Up Recommendations  Home health PT;Supervision for mobility/OOB     Equipment Recommendations  Other (comment) (pt requests portable electric wheelchair)    Recommendations for Other Services       Precautions / Restrictions Precautions Precautions: Fall Precaution Comments: Pt not receptive to using RW Restrictions Weight Bearing Restrictions: No    Mobility  Bed Mobility Overal bed mobility: Modified Independent Bed Mobility: Supine to Sit     Supine to sit: Modified independent (Device/Increase time) Sit to supine: Modified independent (Device/Increase time)   General bed mobility comments: Pt performed from flat surface with heavy reliance on rail,  pt required self assistance to loft B LEs into bed.    Transfers Overall transfer level: Needs assistance Equipment used: None Transfers: Sit to/from Stand Sit to Stand: Min guard         General transfer comment: Pt with saturated diaper and 2 pads with urine.  Pt had a clean diaper and agreed to change it.  Pt put diaper on in  supine up to above knees.  Pt then put on gown in sitting.  Needed assist to steady pt initially upon standing. Steadied pt while she pulled diaper up and assisted pt to secure diaper.    Ambulation/Gait Ambulation/Gait assistance: Min guard;Min assist Ambulation Distance (Feet): 50 Feet Assistive device: 1 person hand held assist Gait Pattern/deviations: Step-through pattern;Decreased stride length;Shuffle;Drifts right/left Gait velocity: decreased Gait velocity interpretation: Below normal speed for age/gender General Gait Details: Patient with slow gait and unsteady gait.  Pt needed HHA for balance and took laps in room due to O2 had to be kept in place at 4L to keep sats >90%.  Pt required cues for turning and backing to maintain safety.     Stairs            Wheelchair Mobility    Modified Rankin (Stroke Patients Only)       Balance Overall balance assessment: Needs assistance;History of Falls Sitting-balance support: No upper extremity supported;Feet supported Sitting balance-Leahy Scale: Good     Standing balance support: Single extremity supported;During functional activity Standing balance-Leahy Scale: Poor Standing balance comment: relies on at least one UE support for balance              High level balance activites: Direction changes;Turns;Sudden stops High Level Balance Comments: min to min guard assist for balance with challenges.     Cognition Arousal/Alertness: Awake/alert Behavior During Therapy: WFL for tasks assessed/performed Overall Cognitive Status: Within Functional Limits for tasks assessed  Exercises General Exercises - Lower Extremity Ankle Circles/Pumps: AROM;Both;10 reps;Seated Long Arc Quad: AROM;Both;10 reps;Seated    General Comments        Pertinent Vitals/Pain Pain Assessment: Faces Faces Pain Scale: Hurts little more Pain Location: back of bil knees Pain Descriptors / Indicators:  Aching;Grimacing;Guarding Pain Intervention(s): Limited activity within patient's tolerance;Monitored during session;Repositioned  VSS on 4LO2.    Home Living                      Prior Function            PT Goals (current goals can now be found in the care plan section) Progress towards PT goals: Progressing toward goals    Frequency  Min 3X/week    PT Plan Current plan remains appropriate    Co-evaluation             End of Session Equipment Utilized During Treatment: Gait belt;Oxygen Activity Tolerance: Patient limited by fatigue;Patient limited by pain Patient left: in bed;with call bell/phone within reach;with bed alarm set     Time: 1349-1415 PT Time Calculation (min) (ACUTE ONLY): 26 min  Charges:  $Gait Training: 8-22 mins $Therapeutic Exercise: 8-22 mins                    G Codes:      [name] 03-07-16, 4:25 PM Cyd Hostler,PT Acute Rehabilitation [phone] (pager)

## 2016-02-25 NOTE — Progress Notes (Signed)
CRITICAL VALUE ALERT  Critical value received:  Urine culture +CRE  Date of notification:  02/25/16   Time of notification:  1131  Critical value read back:Yes.    Nurse who received alert:  Villa Herb RN  MD notified (1st page):  Dr.Gambino  Time of first page:  1140  MD notified (2nd page):  Time of second page:  Responding MD:  Dr. Jonathon Jordan  Time MD responded:

## 2016-02-25 NOTE — Telephone Encounter (Signed)
Makayla Vasquez b calling from [facility] to get authorization for the pt to have more visits at the rheumatologist. [name], CMA

## 2016-02-25 NOTE — Progress Notes (Signed)
Inpatient Diabetes Program Recommendations  AACE/ADA: New Consensus Statement on Inpatient Glycemic Control (2015)  Target Ranges:  Prepandial:   less than 140 mg/dL      Peak postprandial:   less than 180 mg/dL (1-2 hours)      Critically ill patients:  140 - 180 mg/dL   Review of Glycemic ControlResults for TAWNEE, CLEGG (MRN [id number]) as of 02/25/2016 11:46  Ref. Range 02/24/2016 08:20 02/24/2016 12:59 02/24/2016 16:59 02/24/2016 20:36 02/25/2016 08:15  Glucose-Capillary Latest Ref Range: 65-99 mg/dL 937 (H) 169 (H) 678 (H) 290 (H) 347 (H)  Results for ZULEYMA, SCHARF (MRN [id number]) as of 02/25/2016 11:46  Ref. Range 12/21/2015 05:30  Hemoglobin A1C Latest Ref Range: 4.8-5.6 % 6.5 (H)   Diabetes history: Type 2 diabetes Outpatient Diabetes medications: Metformin 1000 mg bid,  Current orders for Inpatient glycemic control:  Novolog moderate tid with meals and HS, Prednisone 30 mg bid  Inpatient Diabetes Program Recommendations:    Note that patient on stress steroids.  CBG's are elevated with steroids use however A1C showed good control of blood sugars in March of 2017.  While on stress steroids, consider adding NPH 10 units bid.   Will follow.  Thanks, Beryl Meager, RN, BC-ADM Inpatient Diabetes Coordinator Pager [phone] (8a-5p)

## 2016-02-26 ENCOUNTER — Inpatient Hospital Stay: Payer: Medicare Other | Admitting: Internal Medicine

## 2016-02-26 LAB — HEMOGLOBIN AND HEMATOCRIT, BLOOD
HCT: 36.8 % (ref 36.0–46.0)
HEMOGLOBIN: 11.2 g/dL — AB (ref 12.0–15.0)

## 2016-02-26 LAB — CULTURE, BLOOD (ROUTINE X 2): CULTURE: NO GROWTH

## 2016-02-26 LAB — CK: Total CK: 645 U/L — ABNORMAL HIGH (ref 38–234)

## 2016-02-26 LAB — GLUCOSE, CAPILLARY
GLUCOSE-CAPILLARY: 320 mg/dL — AB (ref 65–99)
GLUCOSE-CAPILLARY: 370 mg/dL — AB (ref 65–99)
Glucose-Capillary: 330 mg/dL — ABNORMAL HIGH (ref 65–99)

## 2016-02-26 MED ORDER — IOPAMIDOL (ISOVUE-300) INJECTION 61%: 75.0000 mL | Freq: Once | INTRAVENOUS | Status: DC | PRN

## 2016-02-26 NOTE — Progress Notes (Signed)
Family Medicine Teaching Service Daily Progress Note Intern Pager: [phone]  Patient name: Makayla Vasquez Medical record number: [id number] Date of birth: 10-24-1980 Age: 35 y.o. Gender: female  Primary Care Provider: Saralyn Pilar, DO Consultants: None Code Status: Full   Pt Overview and Major Events to Date:  5/5: Admitted to FPTS  Assessment and Plan: DERHONDA EASTLICK is a 35 y.o. female presenting with nausea, vomiting, diarrhea, abdominal pain, and cough. Concern for sepsis. PMH is significant for HFrEF (EF 40-45%), T2DM, congenital AV block with pace maker, and interstitial lung disease on chronic prednisone and immunosuppressive therapy.   SIRS/Sepsis: Initially concerned with presenting symptoms / vitals. She has stabilized, though grew out staph species resistant to Methicillin. Report was corrected and it is NOT Staph aureus. No leukocytosis, afebrile. Species of 1/2 initial cultures pending. Repeat blood NGTD. Zosyn (5/4-5/7), vital signs stable after patient has been off Zosyn for 24 hours.  - Vital signs per floor protocol - Urine Cx 40K E.Coli (sensitive to CTX) and 40K ENTEROBACTER CLOACAE (resitant to CTX). Pt not symptomatic, no need for treatment at this time. - Discharge today  Anemia - Chronically Anemic Hgb 9-10. Has been downtrending the last two days. No melena or BRBPR. Less likely dilutional given 2 pt. Drop since yesterday. Pt. Asymptomatic at this time. B12, Folate normal. Iron panel, Iron normal at 80, TIBC decreased at 193, ferritin high at 658. Was transfused 1 U yesterday. Hgb 10 today - HemOccult stool card.  - Risk for GI ulceration / bleeding with prednisone, continue pepcid.   Elevated CK/Myalgias/LE Weakness: Had suspected viral myositis vs statin induced myopathy vs steroid induced myopathy at last hospitalization with TSH wnl. CK was high then but downtrended after statin was removed. Was sent home with instructions not to continue statin. Has also  had this similar presentation in the past for suspected steroid induced myositis and went to CIR. This hospitalization, patient had initial Troponin of 0.09 and CK of 3265. Troponin and CK down trending, now 878. 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  - PT/OT consulted: HH PT, supervision for mobility/OOB - continue to hold statin - Referral to Rheum Specialist on D/C with regard to Rheum Myositis workup.   HFrEF (EF 40-45%): CXR with signs of possible pulmonary edema although lung exam is unremarkable for crackles. No clinical signs of fluid overload. Has gained 3Lbs up from 5/5-5/6. +600cc - Daily weights and strict I/O - Continue home Cozaar, Bisoprolol and Lasix  Interstitial Lung Disease, on chronic prednisone and immunosuppressive therapy: On 4L O2 at home. Attempted to call patient's rhematologist, Dr. Elmer Ramp. Office stated they have never seen her before.  - supplemental O2, to keep O2 > 92% - continue home Cellcept 500 mg BID, per Pulm during last admission, after acute infection this should be titrated up as outpatient to wean off prednisone  - cont Dulera BID and Albuterol PRN  - prednisone 30mg  BID.   T2DM: At rehab discharge in March, patient was instructed to d/c insulin use as had episodes of hypoglycemia as steroid dose was lowered.  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 She has not had any further, nausea, diarrhea. Still no BM. Continues to have bilateral thigh pain.   Objective: Temp:  [98.2 F (36.8 C)-99 F (37.2 C)] 98.5 F (36.9 C) (05/09 0355) Pulse Rate:  [60-70] 60  (05/08 1726) Resp:  [17-37] 29 (05/09 0355) BP: (101-144)/(55-77) 116/67 mmHg (05/09 0355) SpO2:  [95 %-100 %] 98 % (05/09 0355) Weight:  [160 lb 11.5 oz (72.9 kg)] 160 lb 11.5 oz (72.9 kg) (05/08 1500) Physical Exam: General: lying in bed, NAD, non-toxic appearing, laying flat in bed.  Cardiovascular: RRR. No murmur appreciated Respiratory: CTAB. Appropriate rate, unlabored. O2 3.5L Abdomen: +BS, soft, ND, non tender to palpation  Extremities: Trace, LE edema.  Neuro: Alert and oriented. Sensation in all extremities intact. Strength 5/5 in UE. Strength 3+ in hips bilaterally, 4+/5 in dorsiflexion/planter flexion bilaterally   Laboratory:  Recent Labs Lab 02/23/16 0612 02/24/16 0601 02/24/16 1556 02/25/16 0822  WBC 6.6 7.4  --  9.7  HGB 8.7* 6.9* 11.3* 10.0*  HCT 28.7* 23.2* 38.3 33.6*  PLT 317 392  --  325    Recent Labs Lab 02/21/16 1700 02/22/16 0558 02/24/16 0601 02/24/16 1106  NA 137 140 137 137  K 4.1 4.0 4.6 4.1  CL 101 104 104 101  CO2 27 25 22 24   BUN 11 12 11 8   CREATININE 0.64 0.58 0.60 0.70  CALCIUM 8.7* 7.9* 8.6* 8.7*  PROT 6.4* 5.5*  --   --   BILITOT 0.8 0.7  --   --   ALKPHOS 59 48  --   --   ALT 30 28  --   --   AST 74* 54*  --   --   GLUCOSE 119* 207* 333* 403*   Blood Cx 5/4  1/2 - + for Staph Species (corrected from staph aureus)- It is Methicillin resistant.   Blood Cx 5/7 - pending.   Anti Jo +   Imaging/Diagnostic Tests: No results found.  , MD 02/26/2016, 6:52 AM PGY-1, [place] [facility] FPTS Intern pager: [phone], text pages welcome

## 2016-02-26 NOTE — Progress Notes (Signed)
Patient examined this morning, chest pain has resolved. No signs of ACS. Patient stable for discharge.   Anders Simmonds, MD [facility], PGY-1 FPTS Intern pager: [phone], text pages welcome

## 2016-02-26 NOTE — Progress Notes (Signed)
Patient being discharge home. Patient given discharge instructions. Patient informed of follow up appts. Patient verbalized understanding. Patient discharge home with boyfriend.

## 2016-02-26 NOTE — Progress Notes (Addendum)
Have notified attending that patient had mid chest pain, pain was reproducible on exam, with no associated dyspnea.   Patient is now resting comfortably but, due to the hour, will hold discharge home until tomorrow per MD.

## 2016-02-26 NOTE — Progress Notes (Signed)
Inpatient Diabetes Program Recommendations  AACE/ADA: New Consensus Statement on Inpatient Glycemic Control (2015)  Target Ranges:  Prepandial:   less than 140 mg/dL      Peak postprandial:   less than 180 mg/dL (1-2 hours)      Critically ill patients:  140 - 180 mg/dL   Review of Glycemic ControlResults for Makayla Vasquez, Makayla Vasquez (MRN [id number]) as of 02/26/2016 10:57  Ref. Range 02/25/2016 08:15 02/25/2016 12:06 02/25/2016 17:25 02/25/2016 22:22 02/26/2016 08:56  Glucose-Capillary Latest Ref Range: 65-99 mg/dL 856 (H) 314 (H) 970 (H) 206 (H) 330 (H)    Diabetes history: Type 2 diabetes Outpatient Diabetes medications: Metformin 1000 mg bid,  Current orders for Inpatient glycemic control:  Novolog moderate tid with meals and HS, Prednisone 30 mg bid  Inpatient Diabetes Program Recommendations:    Note that patient on stress steroids. CBG's are elevated with steroids use however A1C showed good control of blood sugars in March of 2017. While on stress steroids, consider adding NPH 10 units bid.   Thanks, Beryl Meager, RN, BC-ADM Inpatient Diabetes Coordinator Pager [phone] (8a-5p)

## 2016-02-26 NOTE — Telephone Encounter (Signed)
Spoke with a lady at [facility] and she was able to finish the referral for karen. Deseree Bruna Potter, CMA

## 2016-02-26 NOTE — Telephone Encounter (Signed)
I would like to provide verbal authorization for patient to get more visits to follow-up with Rheumatology.  Do you have any phone contact info for Clydie Braun at [facility]? Otherwise, could you please notify them that I will provide this authorization. I am unsure if they need a referral or any other orders.  Saralyn Pilar, DO [facility] Family Medicine, PGY-3

## 2016-02-28 ENCOUNTER — Inpatient Hospital Stay: Payer: Medicare Other | Admitting: Family Medicine

## 2016-02-28 ENCOUNTER — Encounter: Payer: Self-pay | Admitting: Family Medicine

## 2016-02-29 ENCOUNTER — Telehealth: Payer: Self-pay | Admitting: *Deleted

## 2016-02-29 DIAGNOSIS — L930 Discoid lupus erythematosus: Secondary | ICD-10-CM | POA: Diagnosis not present

## 2016-02-29 DIAGNOSIS — T380X5A Adverse effect of glucocorticoids and synthetic analogues, initial encounter: Principal | ICD-10-CM

## 2016-02-29 DIAGNOSIS — J45909 Unspecified asthma, uncomplicated: Secondary | ICD-10-CM | POA: Diagnosis not present

## 2016-02-29 DIAGNOSIS — J849 Interstitial pulmonary disease, unspecified: Secondary | ICD-10-CM | POA: Diagnosis not present

## 2016-02-29 DIAGNOSIS — I5020 Unspecified systolic (congestive) heart failure: Secondary | ICD-10-CM | POA: Diagnosis not present

## 2016-02-29 DIAGNOSIS — E119 Type 2 diabetes mellitus without complications: Secondary | ICD-10-CM | POA: Diagnosis not present

## 2016-02-29 DIAGNOSIS — F419 Anxiety disorder, unspecified: Secondary | ICD-10-CM | POA: Diagnosis not present

## 2016-02-29 DIAGNOSIS — M069 Rheumatoid arthritis, unspecified: Secondary | ICD-10-CM | POA: Diagnosis not present

## 2016-02-29 DIAGNOSIS — I10 Essential (primary) hypertension: Secondary | ICD-10-CM | POA: Diagnosis not present

## 2016-02-29 DIAGNOSIS — E669 Obesity, unspecified: Secondary | ICD-10-CM | POA: Diagnosis not present

## 2016-02-29 DIAGNOSIS — R739 Hyperglycemia, unspecified: Secondary | ICD-10-CM

## 2016-02-29 LAB — CULTURE, BLOOD (ROUTINE X 2)
CULTURE: NO GROWTH
Culture: NO GROWTH

## 2016-02-29 MED ORDER — INSULIN NPH (HUMAN) (ISOPHANE) 100 UNIT/ML ~~LOC~~ SUSP: 5.0000 [IU] | Freq: Two times a day (BID) | SUBCUTANEOUS | Status: DC

## 2016-02-29 NOTE — Telephone Encounter (Signed)
Patient states she recently got out of the hospital and her blood sugar has been remaining over 300. And just now when she tried to check it the meter just said high and didn't give her a number. Patient unsure of how much insulin she is supposed to take.

## 2016-02-29 NOTE — Telephone Encounter (Signed)
Called patient back. Recently discharged from hospital on 02/26/16 with variety of issues, this was re-admission, concern for sepsis but ultimately urine no growth, primary issue with muscle myopathy with steroid induced vs statin induced. Significant concern now with positive anti-jo ab with autoimmune myositis, she was arranged close follow-up to re-establish with Rheumatology, her chronic prednisone dose was adjusted and increased to now Prednisone 30mg  BID for interim until sees Rheum on Monday 5/15.  Now, she calls regarding elevated CBGs reading "high" and >300, previously she had DM2 from chronic steroids but well controlled on metformin (she had been on lantus in the past when higher doses steroid), Lantus was DC'd in 12/2015 due to hypoglycemia. She has limited Lantus supply at home remaining and unsure if it is expired. In hospital, Diabetes coordinator evaluated her prior to discharge and recommended NPH insulin 10 units BID while on stress dose steroids.  Called patient back, and discussed this all with her, and decision to start new rx NPH insulin 5-10 units BID with meals, she can pick up today and do one dose this afternoon with food, start 5 units BID, for few days, may titrate up to 10 units if needed CBG >250 persistently. Only to use while on high dose prednisone, soon to see Rheum and maybe reducing dose, follow-up with 7-10 as needed regarding insulin titration.  Return criteria given, but currently without nausea, vomiting, fever, abdominal pain or other new symptoms, mainly complains of polyuria. Advised to stay well hydrated.  [place], DO [facility] Family Medicine, PGY-3

## 2016-03-03 ENCOUNTER — Inpatient Hospital Stay: Payer: Medicare Other | Admitting: Adult Health

## 2016-03-03 DIAGNOSIS — Z79899 Other long term (current) drug therapy: Secondary | ICD-10-CM | POA: Diagnosis not present

## 2016-03-03 DIAGNOSIS — Z87891 Personal history of nicotine dependence: Secondary | ICD-10-CM | POA: Diagnosis not present

## 2016-03-03 DIAGNOSIS — I442 Atrioventricular block, complete: Secondary | ICD-10-CM | POA: Diagnosis not present

## 2016-03-03 DIAGNOSIS — Z888 Allergy status to other drugs, medicaments and biological substances status: Secondary | ICD-10-CM | POA: Diagnosis not present

## 2016-03-03 DIAGNOSIS — Z950 Presence of cardiac pacemaker: Secondary | ICD-10-CM | POA: Diagnosis not present

## 2016-03-03 DIAGNOSIS — D8989 Other specified disorders involving the immune mechanism, not elsewhere classified: Secondary | ICD-10-CM | POA: Diagnosis not present

## 2016-03-03 DIAGNOSIS — R634 Abnormal weight loss: Secondary | ICD-10-CM | POA: Diagnosis not present

## 2016-03-03 DIAGNOSIS — R29898 Other symptoms and signs involving the musculoskeletal system: Secondary | ICD-10-CM | POA: Diagnosis not present

## 2016-03-03 DIAGNOSIS — J849 Interstitial pulmonary disease, unspecified: Secondary | ICD-10-CM | POA: Diagnosis not present

## 2016-03-04 ENCOUNTER — Telehealth: Payer: Self-pay | Admitting: *Deleted

## 2016-03-04 DIAGNOSIS — L930 Discoid lupus erythematosus: Secondary | ICD-10-CM | POA: Diagnosis not present

## 2016-03-04 DIAGNOSIS — E119 Type 2 diabetes mellitus without complications: Secondary | ICD-10-CM | POA: Diagnosis not present

## 2016-03-04 DIAGNOSIS — J45909 Unspecified asthma, uncomplicated: Secondary | ICD-10-CM | POA: Diagnosis not present

## 2016-03-04 DIAGNOSIS — I5020 Unspecified systolic (congestive) heart failure: Secondary | ICD-10-CM | POA: Diagnosis not present

## 2016-03-04 DIAGNOSIS — I10 Essential (primary) hypertension: Secondary | ICD-10-CM | POA: Diagnosis not present

## 2016-03-04 DIAGNOSIS — M069 Rheumatoid arthritis, unspecified: Secondary | ICD-10-CM | POA: Diagnosis not present

## 2016-03-04 NOTE — Telephone Encounter (Signed)
Onalee Hua, Physical Therapist with [facility] need verbal orders to continue physical therapy two times a week for 4 weeks. Please give him a call at [phone].  Clovis Pu, RN

## 2016-03-04 NOTE — Telephone Encounter (Signed)
Halford Decamp PT back at [facility] (# provided) and left a voicemail providing my verbal authorization to proceed with PT 2 x a week for 4 weeks. No call back received. Should be no further action then.  Saralyn Pilar, DO [facility] Family Medicine, PGY-3

## 2016-03-05 DIAGNOSIS — L930 Discoid lupus erythematosus: Secondary | ICD-10-CM | POA: Diagnosis not present

## 2016-03-05 DIAGNOSIS — J45909 Unspecified asthma, uncomplicated: Secondary | ICD-10-CM | POA: Diagnosis not present

## 2016-03-05 DIAGNOSIS — M069 Rheumatoid arthritis, unspecified: Secondary | ICD-10-CM | POA: Diagnosis not present

## 2016-03-05 DIAGNOSIS — E119 Type 2 diabetes mellitus without complications: Secondary | ICD-10-CM | POA: Diagnosis not present

## 2016-03-05 DIAGNOSIS — I5020 Unspecified systolic (congestive) heart failure: Secondary | ICD-10-CM | POA: Diagnosis not present

## 2016-03-05 DIAGNOSIS — I10 Essential (primary) hypertension: Secondary | ICD-10-CM | POA: Diagnosis not present

## 2016-03-06 DIAGNOSIS — L930 Discoid lupus erythematosus: Secondary | ICD-10-CM | POA: Diagnosis not present

## 2016-03-06 DIAGNOSIS — J45909 Unspecified asthma, uncomplicated: Secondary | ICD-10-CM | POA: Diagnosis not present

## 2016-03-06 DIAGNOSIS — M069 Rheumatoid arthritis, unspecified: Secondary | ICD-10-CM | POA: Diagnosis not present

## 2016-03-06 DIAGNOSIS — I5020 Unspecified systolic (congestive) heart failure: Secondary | ICD-10-CM | POA: Diagnosis not present

## 2016-03-06 DIAGNOSIS — E119 Type 2 diabetes mellitus without complications: Secondary | ICD-10-CM | POA: Diagnosis not present

## 2016-03-06 DIAGNOSIS — I10 Essential (primary) hypertension: Secondary | ICD-10-CM | POA: Diagnosis not present

## 2016-03-07 DIAGNOSIS — M069 Rheumatoid arthritis, unspecified: Secondary | ICD-10-CM | POA: Diagnosis not present

## 2016-03-07 DIAGNOSIS — I5020 Unspecified systolic (congestive) heart failure: Secondary | ICD-10-CM | POA: Diagnosis not present

## 2016-03-07 DIAGNOSIS — E119 Type 2 diabetes mellitus without complications: Secondary | ICD-10-CM | POA: Diagnosis not present

## 2016-03-07 DIAGNOSIS — I10 Essential (primary) hypertension: Secondary | ICD-10-CM | POA: Diagnosis not present

## 2016-03-07 DIAGNOSIS — J45909 Unspecified asthma, uncomplicated: Secondary | ICD-10-CM | POA: Diagnosis not present

## 2016-03-07 DIAGNOSIS — L930 Discoid lupus erythematosus: Secondary | ICD-10-CM | POA: Diagnosis not present

## 2016-03-10 DIAGNOSIS — E119 Type 2 diabetes mellitus without complications: Secondary | ICD-10-CM | POA: Diagnosis not present

## 2016-03-10 DIAGNOSIS — I10 Essential (primary) hypertension: Secondary | ICD-10-CM | POA: Diagnosis not present

## 2016-03-10 DIAGNOSIS — I5020 Unspecified systolic (congestive) heart failure: Secondary | ICD-10-CM | POA: Diagnosis not present

## 2016-03-10 DIAGNOSIS — L930 Discoid lupus erythematosus: Secondary | ICD-10-CM | POA: Diagnosis not present

## 2016-03-10 DIAGNOSIS — M069 Rheumatoid arthritis, unspecified: Secondary | ICD-10-CM | POA: Diagnosis not present

## 2016-03-10 DIAGNOSIS — J45909 Unspecified asthma, uncomplicated: Secondary | ICD-10-CM | POA: Diagnosis not present

## 2016-03-11 DIAGNOSIS — M069 Rheumatoid arthritis, unspecified: Secondary | ICD-10-CM | POA: Diagnosis not present

## 2016-03-11 DIAGNOSIS — E119 Type 2 diabetes mellitus without complications: Secondary | ICD-10-CM | POA: Diagnosis not present

## 2016-03-11 DIAGNOSIS — L930 Discoid lupus erythematosus: Secondary | ICD-10-CM | POA: Diagnosis not present

## 2016-03-11 DIAGNOSIS — I10 Essential (primary) hypertension: Secondary | ICD-10-CM | POA: Diagnosis not present

## 2016-03-11 DIAGNOSIS — J45909 Unspecified asthma, uncomplicated: Secondary | ICD-10-CM | POA: Diagnosis not present

## 2016-03-11 DIAGNOSIS — I5020 Unspecified systolic (congestive) heart failure: Secondary | ICD-10-CM | POA: Diagnosis not present

## 2016-03-12 DIAGNOSIS — I10 Essential (primary) hypertension: Secondary | ICD-10-CM | POA: Diagnosis not present

## 2016-03-12 DIAGNOSIS — I5020 Unspecified systolic (congestive) heart failure: Secondary | ICD-10-CM | POA: Diagnosis not present

## 2016-03-12 DIAGNOSIS — E119 Type 2 diabetes mellitus without complications: Secondary | ICD-10-CM | POA: Diagnosis not present

## 2016-03-12 DIAGNOSIS — J45909 Unspecified asthma, uncomplicated: Secondary | ICD-10-CM | POA: Diagnosis not present

## 2016-03-12 DIAGNOSIS — M069 Rheumatoid arthritis, unspecified: Secondary | ICD-10-CM | POA: Diagnosis not present

## 2016-03-12 DIAGNOSIS — L930 Discoid lupus erythematosus: Secondary | ICD-10-CM | POA: Diagnosis not present

## 2016-03-14 ENCOUNTER — Telehealth: Payer: Self-pay | Admitting: *Deleted

## 2016-03-14 DIAGNOSIS — J45909 Unspecified asthma, uncomplicated: Secondary | ICD-10-CM | POA: Diagnosis not present

## 2016-03-14 DIAGNOSIS — E119 Type 2 diabetes mellitus without complications: Secondary | ICD-10-CM | POA: Diagnosis not present

## 2016-03-14 DIAGNOSIS — I5020 Unspecified systolic (congestive) heart failure: Secondary | ICD-10-CM | POA: Diagnosis not present

## 2016-03-14 DIAGNOSIS — M069 Rheumatoid arthritis, unspecified: Secondary | ICD-10-CM | POA: Diagnosis not present

## 2016-03-14 DIAGNOSIS — I10 Essential (primary) hypertension: Secondary | ICD-10-CM | POA: Diagnosis not present

## 2016-03-14 DIAGNOSIS — L930 Discoid lupus erythematosus: Secondary | ICD-10-CM | POA: Diagnosis not present

## 2016-03-14 NOTE — Telephone Encounter (Signed)
Called patient, spoke with [place]. She states CBGs 300-400s, asymptomatic. Still on Prednisone chronic taper now 40mg  daily, using Novolin 70/30 10units BID but difficulty as she cannot draw it up herself, needs others to do this. Requesting to return to Lantus for now, still has some and refills. Advised that she may start 15 units daily Lantus, STOP Novolin, titrate up lantus by 1 units if fasting AM cbg >250. Caution when reduce prednisone down to 20 or less, may need to reduce lantus by 1 if < 150, otherwise max titrate up lantus 25 then follow-up.  Also notified Home [facility] with this info.  [facility], DO [facility], PGY-3

## 2016-03-14 NOTE — Telephone Encounter (Signed)
Pt does not want to change her meds to the novolog, she would like to stay on Lantus but needs to know what dose Dr. Althea Charon wants her to take. Please call her with this info. Makayla Vasquez, Maryjo Rochester, CMA

## 2016-03-14 NOTE — Telephone Encounter (Signed)
Donita from [facility] calls, wanted to advise PCP of pt's fasting blood sugar are running in high 300s. [phone]

## 2016-03-18 ENCOUNTER — Encounter: Payer: Self-pay | Admitting: Family Medicine

## 2016-03-18 ENCOUNTER — Ambulatory Visit (INDEPENDENT_AMBULATORY_CARE_PROVIDER_SITE_OTHER): Payer: Medicare Other | Admitting: Family Medicine

## 2016-03-18 VITALS — BP 101/68 | HR 89 | Temp 97.9°F | Wt 145.0 lb

## 2016-03-18 DIAGNOSIS — J9611 Chronic respiratory failure with hypoxia: Secondary | ICD-10-CM

## 2016-03-18 DIAGNOSIS — M791 Myalgia: Secondary | ICD-10-CM

## 2016-03-18 DIAGNOSIS — M609 Myositis, unspecified: Secondary | ICD-10-CM

## 2016-03-18 DIAGNOSIS — J849 Interstitial pulmonary disease, unspecified: Secondary | ICD-10-CM

## 2016-03-18 DIAGNOSIS — M069 Rheumatoid arthritis, unspecified: Secondary | ICD-10-CM | POA: Diagnosis not present

## 2016-03-18 DIAGNOSIS — L930 Discoid lupus erythematosus: Secondary | ICD-10-CM | POA: Diagnosis not present

## 2016-03-18 DIAGNOSIS — E118 Type 2 diabetes mellitus with unspecified complications: Secondary | ICD-10-CM

## 2016-03-18 DIAGNOSIS — I10 Essential (primary) hypertension: Secondary | ICD-10-CM | POA: Diagnosis not present

## 2016-03-18 DIAGNOSIS — I5020 Unspecified systolic (congestive) heart failure: Secondary | ICD-10-CM | POA: Diagnosis not present

## 2016-03-18 DIAGNOSIS — Z794 Long term (current) use of insulin: Secondary | ICD-10-CM

## 2016-03-18 DIAGNOSIS — IMO0001 Reserved for inherently not codable concepts without codable children: Secondary | ICD-10-CM

## 2016-03-18 DIAGNOSIS — J45909 Unspecified asthma, uncomplicated: Secondary | ICD-10-CM | POA: Diagnosis not present

## 2016-03-18 DIAGNOSIS — D8989 Other specified disorders involving the immune mechanism, not elsewhere classified: Secondary | ICD-10-CM

## 2016-03-18 DIAGNOSIS — E119 Type 2 diabetes mellitus without complications: Secondary | ICD-10-CM | POA: Diagnosis not present

## 2016-03-18 LAB — POCT GLYCOSYLATED HEMOGLOBIN (HGB A1C): Hemoglobin A1C: 11.1

## 2016-03-18 MED ORDER — OXYCODONE-ACETAMINOPHEN 5-325 MG PO TABS: 1.0000 | ORAL_TABLET | Freq: Four times a day (QID) | ORAL | Status: DC | PRN

## 2016-03-18 NOTE — Progress Notes (Signed)
Subjective:    Patient ID: Makayla Vasquez, female    DOB: October 19, 1981, 35 y.o.   MRN: [id number]  Makayla Vasquez is a 35 y.o. female presenting on 03/18/2016 for Follow-up  HPI   Hospitalized 02/21/16 to 02/26/16 for Generalized weakness, myalgias, and cough with initial concern for sepsis. Initially treated with antibiotics for potential urinary source, given chronic ILD no evidence of PNA. Given afebrile, urine culture no growth antibiotics with IV Zosyn were discontinued, also BCx 1/2 grew MR staph epi likely contaminant.  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 Additionally concern for rheumatological etiology given prior dx SLE vs overlapping syndrome. Prior CIR and PT rehab. - Since last hospitalization, patient has now established with Rheumatologist (Dr Maurine Minister Ang) in [place] on 03/03/16. At this visit and review of prior labs diagnosed with anti-synthetase syndrome (with anti Jo-1 antibody and chronic interstitial lung disease), with elevated CK. Treated with CellCept will titrate up to 1g BID while gradually tapering prednisone, currently on 40mg  daily for this week then reduce to 30mg  daily for 2 weeks, taper q 2 weeks by 10mg . If not tolerating CellCept high dose in future can transition to Myfortic (due to less GI side effects) - remain off atorvastatin, given likely statin myopathy - Continued weight loss down to 145 lb today from 150s - Denies any fall/injury or trauma, joint pain redness or effusion, rash  Chronic Interstitial Lung Disease chronic resp failure Known ILD on 2-4L O2 at home, followed by Pulmonology. No significant problems with respiratory status during recent hospitalization, tolerated home O2, and steroid taper was prolonged. - Today with home O2 on 2L. Requests  portable O2 concentrator from pulmonology. - Admits some cough but non productive - Denies fever, sinus pain/pressure  DM2, chronic steroid use: - Reports concerns about Novolin 70/30 insulin 10u BID because she needs assistance to draw the medication up to administer it and has missed several doses. CBGs elevated 300-400s and "high". She checks CBG 2x daily and PRN if feels elevated sugar. - Has Lantus at home and would like to resume this - Currently taking Prednisone 40mg  this week, then will taper to 30mg  x 2 weeks, then 20mg  for 2 weeks, then 10mg  indefinitely unless changed - Elevated CBGs increased urinary frequency and urgency with some incontinence, improved when CBGs lower Admits to elevated blood sugars while on higher dose prednison - Denies hypoglycemia  Social History  Substance Use Topics  . Smoking status: Former Smoker -- 0.25 packs/day for .5 years    Types: Cigarettes    Quit date: 07/25/1996  . Smokeless tobacco: Never Used  . Alcohol Use: No    Review of Systems Per HPI unless specifically indicated above     Objective:    BP 101/68 mmHg  Pulse 89  Temp(Src) 97.9 F (36.6 C) (Oral)  Wt 145 lb (65.772 kg)  Wt Readings from Last 3 Encounters:  03/18/16 145 lb (65.772 kg)  02/26/16 159 lb 13.3 oz (72.5 kg)  02/20/16 165 lb 2 oz (74.9 kg)    Physical Exam  Constitutional: She is oriented to person, place, and time. She appears well-developed and well-nourished. No distress.  Chronically ill and thin appearing, currently well, comfortable, cooperative.  HENT:  Head: Normocephalic and atraumatic.  Mouth/Throat: Oropharynx is clear and moist. No oropharyngeal exudate.  [place] O2 in place. oropharynx clear.  Neck: Normal range of motion. Neck supple. No thyromegaly present.  Cardiovascular: Normal rate, regular rhythm, normal heart sounds and intact distal pulses.   No murmur heard. Pulmonary/Chest: Effort normal. No respiratory distress. She has no wheezes.  She has rales (mild bibasilar crackles stable from previous).  Musculoskeletal: She exhibits no edema or tenderness.  Stable significant generalized muscle atrophy, especially bilateral lower extremities. Lower extremity muscle str symmetrical 5/5  Lymphadenopathy:    She has no cervical adenopathy.  Neurological: She is alert and oriented to person, place, and time.  Skin: Skin is warm and dry. No rash noted. She is not diaphoretic.  Psychiatric: She has a normal mood and affect.  Nursing note and vitals reviewed.      Assessment & Plan:   Problem List Items Addressed This Visit    Type 2 diabetes mellitus with complication, with long-term current use of insulin (HCC) - Primary    A1c back up to 11, from 6.5, likely given high dose resumed prednisone chronically, now gradual taper. Also non-adherence to some insulin (did have issues with hypoglycemia before, now persistent hyperglycemia)  Plan: 1. Discontinue Novolin 70/30 - unable to draw up and administer without assistance, has had missed doses 2. Start Lantus 15u daily - titration instructions up by +1 unit every day if AM fasting CBG >250 persistently, caution once prednisone taper down to 20mg  daily (and especially 10mg  daily) may need to reduce dose if CBG fasting <150. High risk hypoglycemia. 3. 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 - Remain off atorvastatin - Tapering prednisone slowly - Rheum following, increasing CellCept - Refilled Percocet today given acute on chronic pain, ideally if pain improved by controlling underlying etiology may not need narcotics in future      Relevant Medications   oxyCODONE-acetaminophen (PERCOCET/ROXICET) 5-325 MG tablet   ILD (interstitial lung disease) (HCC)    Stable after  hospitalization. On home 2-4L O2 supplemental. Lungs with some stable bibasilar coarse sounds similar to baseline without new focal crackles or signs/symptoms of PNA. Continue treating underlying ILD/autoimmune etiology with chronic prednisone taper and increase CellCept per Rheum Follow-up with pulmonology, will need portable O2 concentrator, consider future lung biopsy      Chronic respiratory failure with hypoxia (HCC)   Antisynthetase syndrome (HCC)    Likely rheumatological/autoimmune etiology for constellation of symptoms. Followed by Rheumatology, now at [facility] Dr Ang Continue to gradual titrate up CellCept per rheum Slow taper down Prednisone as planned, 40mg  daily for this week, then 30mg  x 2 weeks, then down by 10 q 2 weeks until stable at 10, unclear if plan to remain at 10 or taper off. Concern with prior adrenal insufficiency off prednisone in past.      Relevant Medications   oxyCODONE-acetaminophen (PERCOCET/ROXICET) 5-325 MG tablet         Meds ordered this encounter  Medications  . oxyCODONE-acetaminophen (PERCOCET/ROXICET) 5-325 MG tablet    Sig: Take 1 tablet by mouth every 6 (six) hours as needed for severe pain.    Dispense:  60 tablet    Refill:  0      Follow up plan: Return in about 4 weeks (around 04/15/2016) for diabetes.  [facility], DO [facility] Family Medicine, PGY-3

## 2016-03-18 NOTE — Assessment & Plan Note (Addendum)
Stable after hospitalization. On home 2-4L O2 supplemental. Lungs with some stable bibasilar coarse sounds similar to baseline without new focal crackles or signs/symptoms of PNA. Continue treating underlying ILD/autoimmune etiology with chronic prednisone taper and increase CellCept per Rheum Follow-up with pulmonology, will need portable O2 concentrator, consider future lung biopsy

## 2016-03-18 NOTE — Patient Instructions (Signed)
Thank you for coming in to clinic today.  1. For Diabetes - A1c went up from 6 to 11, likely with increased prednisone use - Start Lantus insulin 15 units daily around 10am everyday, if fasting blood sugar in morning is >250 then increase dose by +1 unit until you reach 25 to 30 units - Caution with prednisone taper down to 20mg  then 10mg , if your blood sugar drops to < 150s then may need to reduce Lantus dose by 1 unit each day until stable - When blood sugar is better controlled, you should have less urinary frequency, if not improved may referral to Urology let know  2. Rheumatology - Follow-up with Dr Ang as planned, continue plan with prednisone and CellCept medications  3. Follow-up with Pulmology doctor, ask them about the rx for portable O2 concentrator  Please schedule a follow-up appointment with Dr Korea in 4-6 weeks for Diabetes follow-up, sooner if needed. Bring CBG Log and Blood sugar readings.  If you have any other questions or concerns, please feel free to call the clinic to contact me. You may also schedule an earlier appointment if necessary.  However, if your symptoms get significantly worse, please go to the Emergency Department to seek immediate medical attention.  Maurine Minister, DO [facility] Family Medicine

## 2016-03-18 NOTE — Assessment & Plan Note (Addendum)
Likely rheumatological/autoimmune etiology for constellation of symptoms. Followed by Rheumatology, now at [facility] Dr Ang Continue to gradual titrate up CellCept per rheum Slow taper down Prednisone as planned, 40mg  daily for this week, then 30mg  x 2 weeks, then down by 10 q 2 weeks until stable at 10, unclear if plan to remain at 10 or taper off. Concern with prior adrenal insufficiency off prednisone in past.

## 2016-03-18 NOTE — Assessment & Plan Note (Signed)
A1c back up to 11, from 6.5, likely given high dose resumed prednisone chronically, now gradual taper. Also non-adherence to some insulin (did have issues with hypoglycemia before, now persistent hyperglycemia)  Plan: 1. Discontinue Novolin 70/30 - unable to draw up and administer without assistance, has had missed doses 2. Start Lantus 15u daily - titration instructions up by +1 unit every day if AM fasting CBG >250 persistently, caution once prednisone taper down to 20mg  daily (and especially 10mg  daily) may need to reduce dose if CBG fasting <150. High risk hypoglycemia. 3. Follow-up 4-6 weeks, monitor A1c

## 2016-03-18 NOTE — Assessment & Plan Note (Addendum)
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 - Remain off atorvastatin - Tapering prednisone slowly - Rheum following, increasing CellCept - Refilled Percocet today given acute on chronic pain, ideally if pain improved by controlling underlying etiology may not need narcotics in future

## 2016-03-19 DIAGNOSIS — J45909 Unspecified asthma, uncomplicated: Secondary | ICD-10-CM | POA: Diagnosis not present

## 2016-03-19 DIAGNOSIS — L930 Discoid lupus erythematosus: Secondary | ICD-10-CM | POA: Diagnosis not present

## 2016-03-19 DIAGNOSIS — I5020 Unspecified systolic (congestive) heart failure: Secondary | ICD-10-CM | POA: Diagnosis not present

## 2016-03-19 DIAGNOSIS — I10 Essential (primary) hypertension: Secondary | ICD-10-CM | POA: Diagnosis not present

## 2016-03-19 DIAGNOSIS — E119 Type 2 diabetes mellitus without complications: Secondary | ICD-10-CM | POA: Diagnosis not present

## 2016-03-19 DIAGNOSIS — M069 Rheumatoid arthritis, unspecified: Secondary | ICD-10-CM | POA: Diagnosis not present

## 2016-03-20 DIAGNOSIS — M069 Rheumatoid arthritis, unspecified: Secondary | ICD-10-CM | POA: Diagnosis not present

## 2016-03-20 DIAGNOSIS — I10 Essential (primary) hypertension: Secondary | ICD-10-CM | POA: Diagnosis not present

## 2016-03-20 DIAGNOSIS — L930 Discoid lupus erythematosus: Secondary | ICD-10-CM | POA: Diagnosis not present

## 2016-03-20 DIAGNOSIS — E119 Type 2 diabetes mellitus without complications: Secondary | ICD-10-CM | POA: Diagnosis not present

## 2016-03-20 DIAGNOSIS — J45909 Unspecified asthma, uncomplicated: Secondary | ICD-10-CM | POA: Diagnosis not present

## 2016-03-20 DIAGNOSIS — I5020 Unspecified systolic (congestive) heart failure: Secondary | ICD-10-CM | POA: Diagnosis not present

## 2016-03-21 ENCOUNTER — Telehealth: Payer: Self-pay | Admitting: *Deleted

## 2016-03-21 DIAGNOSIS — J45909 Unspecified asthma, uncomplicated: Secondary | ICD-10-CM | POA: Diagnosis not present

## 2016-03-21 DIAGNOSIS — I10 Essential (primary) hypertension: Secondary | ICD-10-CM | POA: Diagnosis not present

## 2016-03-21 DIAGNOSIS — M5416 Radiculopathy, lumbar region: Secondary | ICD-10-CM | POA: Diagnosis not present

## 2016-03-21 DIAGNOSIS — G6289 Other specified polyneuropathies: Secondary | ICD-10-CM | POA: Diagnosis not present

## 2016-03-21 DIAGNOSIS — G7249 Other inflammatory and immune myopathies, not elsewhere classified: Secondary | ICD-10-CM | POA: Diagnosis not present

## 2016-03-21 DIAGNOSIS — I5020 Unspecified systolic (congestive) heart failure: Secondary | ICD-10-CM | POA: Diagnosis not present

## 2016-03-21 DIAGNOSIS — L930 Discoid lupus erythematosus: Secondary | ICD-10-CM | POA: Diagnosis not present

## 2016-03-21 DIAGNOSIS — G729 Myopathy, unspecified: Secondary | ICD-10-CM | POA: Diagnosis not present

## 2016-03-21 DIAGNOSIS — M069 Rheumatoid arthritis, unspecified: Secondary | ICD-10-CM | POA: Diagnosis not present

## 2016-03-21 DIAGNOSIS — E119 Type 2 diabetes mellitus without complications: Secondary | ICD-10-CM | POA: Diagnosis not present

## 2016-03-21 NOTE — Telephone Encounter (Signed)
Donita from [facility] is needing a verbal order to increase patient's home visits to weekly.  Please contact her at [phone] and it is ok to leave a message on this number.  Jazmin Hartsell,CMA

## 2016-03-21 NOTE — Telephone Encounter (Signed)
Called Donita back, today 03/21/16, gave verbal order authorization for increasing [facility] RN visits weekly.  Saralyn Pilar, DO [facility] Family Medicine, PGY-3

## 2016-03-23 ENCOUNTER — Other Ambulatory Visit: Payer: Self-pay | Admitting: Physical Medicine and Rehabilitation

## 2016-03-24 DIAGNOSIS — J45909 Unspecified asthma, uncomplicated: Secondary | ICD-10-CM | POA: Diagnosis not present

## 2016-03-24 DIAGNOSIS — I10 Essential (primary) hypertension: Secondary | ICD-10-CM | POA: Diagnosis not present

## 2016-03-24 DIAGNOSIS — M069 Rheumatoid arthritis, unspecified: Secondary | ICD-10-CM | POA: Diagnosis not present

## 2016-03-24 DIAGNOSIS — I5020 Unspecified systolic (congestive) heart failure: Secondary | ICD-10-CM | POA: Diagnosis not present

## 2016-03-24 DIAGNOSIS — E119 Type 2 diabetes mellitus without complications: Secondary | ICD-10-CM | POA: Diagnosis not present

## 2016-03-24 DIAGNOSIS — L930 Discoid lupus erythematosus: Secondary | ICD-10-CM | POA: Diagnosis not present

## 2016-03-25 ENCOUNTER — Other Ambulatory Visit: Payer: Self-pay | Admitting: Family Medicine

## 2016-03-26 ENCOUNTER — Telehealth: Payer: Self-pay | Admitting: *Deleted

## 2016-03-26 NOTE — Telephone Encounter (Signed)
Makayla Vasquez, Physical Therapist with [facility] called to request an extension in his orders for 2 times a week for 3 more weeks.  The current orders end this week.  Patient has been making good progress.  Patient is walking without an assistance device.  Endurance is improving along with strength 4-5.  However hip extension is still weak; when doing squats patient's leg will give out.  Please give him a call at [phone], secure voicemail.  Clovis Pu, RN

## 2016-03-27 DIAGNOSIS — L930 Discoid lupus erythematosus: Secondary | ICD-10-CM | POA: Diagnosis not present

## 2016-03-27 DIAGNOSIS — I5020 Unspecified systolic (congestive) heart failure: Secondary | ICD-10-CM | POA: Diagnosis not present

## 2016-03-27 DIAGNOSIS — J45909 Unspecified asthma, uncomplicated: Secondary | ICD-10-CM | POA: Diagnosis not present

## 2016-03-27 DIAGNOSIS — I10 Essential (primary) hypertension: Secondary | ICD-10-CM | POA: Diagnosis not present

## 2016-03-27 DIAGNOSIS — M069 Rheumatoid arthritis, unspecified: Secondary | ICD-10-CM | POA: Diagnosis not present

## 2016-03-27 DIAGNOSIS — E119 Type 2 diabetes mellitus without complications: Secondary | ICD-10-CM | POA: Diagnosis not present

## 2016-03-27 NOTE — Telephone Encounter (Signed)
Makayla Vasquez (PT) back, reviewed prior message, I agree patient is making very good progress, and provided verbal order authorization to extend [facility] PT to 2 times a week for 3 more weeks.  Saralyn Pilar, DO [facility] Family Medicine, PGY-3

## 2016-03-31 ENCOUNTER — Other Ambulatory Visit: Payer: Self-pay | Admitting: *Deleted

## 2016-03-31 NOTE — Telephone Encounter (Signed)
Received a call from [facility] stating that they are the new pharmacy for patient.  They needed the PCP to complete fax prescription form with directions, quantity and refills.  Please sign and fax form back to [facility].  Fax form placed in provider box for review.  Clovis Pu, RN

## 2016-04-01 DIAGNOSIS — M069 Rheumatoid arthritis, unspecified: Secondary | ICD-10-CM | POA: Diagnosis not present

## 2016-04-01 DIAGNOSIS — E119 Type 2 diabetes mellitus without complications: Secondary | ICD-10-CM | POA: Diagnosis not present

## 2016-04-01 DIAGNOSIS — I5020 Unspecified systolic (congestive) heart failure: Secondary | ICD-10-CM | POA: Diagnosis not present

## 2016-04-01 DIAGNOSIS — I10 Essential (primary) hypertension: Secondary | ICD-10-CM | POA: Diagnosis not present

## 2016-04-01 DIAGNOSIS — J45909 Unspecified asthma, uncomplicated: Secondary | ICD-10-CM | POA: Diagnosis not present

## 2016-04-01 DIAGNOSIS — L930 Discoid lupus erythematosus: Secondary | ICD-10-CM | POA: Diagnosis not present

## 2016-04-01 NOTE — Telephone Encounter (Signed)
I am covering for Dr. Althea Charon. He will return 6/19. Her medications are complex with multiple prescribers at times prescribing possibly duplicate therapy (famotidine and pantoprazole). Since these are not urgent refill requests, I will have him address this on his return.

## 2016-04-02 ENCOUNTER — Telehealth: Payer: Self-pay | Admitting: Internal Medicine

## 2016-04-02 DIAGNOSIS — I10 Essential (primary) hypertension: Secondary | ICD-10-CM | POA: Diagnosis not present

## 2016-04-02 DIAGNOSIS — J9611 Chronic respiratory failure with hypoxia: Secondary | ICD-10-CM

## 2016-04-02 DIAGNOSIS — L930 Discoid lupus erythematosus: Secondary | ICD-10-CM | POA: Diagnosis not present

## 2016-04-02 DIAGNOSIS — E119 Type 2 diabetes mellitus without complications: Secondary | ICD-10-CM | POA: Diagnosis not present

## 2016-04-02 DIAGNOSIS — J45909 Unspecified asthma, uncomplicated: Secondary | ICD-10-CM | POA: Diagnosis not present

## 2016-04-02 DIAGNOSIS — I5020 Unspecified systolic (congestive) heart failure: Secondary | ICD-10-CM | POA: Diagnosis not present

## 2016-04-02 DIAGNOSIS — M069 Rheumatoid arthritis, unspecified: Secondary | ICD-10-CM | POA: Diagnosis not present

## 2016-04-02 NOTE — Telephone Encounter (Signed)
Spoke with Makayla Vasquez, requesting a POC order be sent to [facility].  Makayla Vasquez states she currently wears 3-4lpm with exertion.    CY please advise if you're ok with placing this order.  Thanks!  Last ov: 01/07/16 with Noe Gens Next ov: 04/08/16  Allergies  Allergen Reactions  . 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 Sertraline Hcl Hives  . Tape Other (See Comments)    Burns skin   Current Outpatient Prescriptions on File Prior to Visit  Medication Sig Dispense Refill  . albuterol (PROVENTIL HFA;VENTOLIN HFA) 108 (90 BASE) MCG/ACT inhaler Inhale 2 puffs into the lungs every 4 (four) hours as needed for wheezing or shortness of breath. 1 each 1  . bisoprolol (ZEBETA) 5 MG tablet Take 0.5 tablets (2.5 mg total) by mouth daily. 15 tablet 6  . Blood Glucose Monitoring Suppl (ONE TOUCH ULTRA 2) w/Device KIT Check blood sugar before meals and at bedtim 1 each 0  . budesonide-formoterol (SYMBICORT) 80-4.5 MCG/ACT inhaler Inhale 2 puffs into the lungs 2 (two) times daily as needed (for shortness of breath). 1 Inhaler 6  . calcium-vitamin D (OSCAL WITH D) 500-200 MG-UNIT per tablet Take 1 tablet by mouth daily with breakfast.     . camphor-menthol (SARNA) lotion Apply topically 2 (two) times daily. (Patient taking differently: Apply 1 application topically 2 (two) times daily as needed for itching. ) 222 mL 0  . etonogestrel (NEXPLANON) 68 MG IMPL implant 1 each by Subdermal route once.    . famotidine (PEPCID) 20 MG tablet Take 1 tablet (20 mg total) by mouth 2 (two) times daily. (Patient taking differently: Take 20 mg by mouth daily. ) 60 tablet 1  . fluocinonide (LIDEX) 0.05 % external solution 1 APPLICATION APPLY ON THE SKIN AS DIRECTED APPLY TO AFFECTED AREAS DAILY ON SCALP AS NEEDED FOR ITCHING.  5  . fluticasone (FLONASE) 50 MCG/ACT nasal spray Place 2 sprays into both nostrils daily. 16 g 6  . furosemide (LASIX) 20 MG tablet Take 20 mg by  mouth daily.  5  . glucose blood (ONE TOUCH ULTRA TEST) test strip Check blood sugar daily before breakfast 100 each 0  . insulin NPH Human (HUMULIN N,NOVOLIN N) 100 UNIT/ML injection Inject 0.05-0.1 mLs (5-10 Units total) into the skin 2 (two) times daily before a meal. Start 5 units. Only use while high dose prednisone. 10 mL 0  . loratadine (CLARITIN) 10 MG tablet Take 1 tablet (10 mg total) by mouth daily. (Patient taking differently: Take 10 mg by mouth daily as needed. ) 30 tablet 11  . losartan (COZAAR) 25 MG tablet Take 0.5 tablets (12.5 mg total) by mouth daily. 15 tablet 6  . metFORMIN (GLUCOPHAGE) 1000 MG tablet TAKE 1 TABLET BY MOUTH TWICE A DAY WITH A MEAL 30 tablet 11  . mycophenolate (CELLCEPT) 500 MG tablet Take 500 mg by mouth 2 (two) times daily.    [name] oxyCODONE-acetaminophen (PERCOCET/ROXICET) 5-325 MG tablet Take 1 tablet by mouth every 6 (six) hours as needed for severe pain. 60 tablet 0  . pantoprazole (PROTONIX) 40 MG tablet Take 1 tablet (40 mg total) by mouth daily. 30 tablet 3  . polyethylene glycol (MIRALAX / GLYCOLAX) packet Take 17 g by mouth daily after supper. (Patient taking differently: Take 17 g by mouth daily as needed for mild constipation. ) 14 each 0  . predniSONE (DELTASONE) 10 MG tablet Take 30 mg by mouth 2 (  two) times daily.    [name] senna-docusate (SENOKOT-S) 8.6-50 MG tablet Take 3 tablets by mouth 2 (two) times daily. (Patient taking differently: Take 3 tablets by mouth 2 (two) times daily as needed for mild constipation. ) 100 tablet 0   No current facility-administered medications on file prior to visit.

## 2016-04-03 NOTE — Telephone Encounter (Signed)
Ok to order- her DME evaluate for portable oxygen concentrator 3-5 L/min

## 2016-04-03 NOTE — Telephone Encounter (Signed)
Order sent to [facility]  Pt aware and states nothing further needed   

## 2016-04-07 ENCOUNTER — Other Ambulatory Visit: Payer: Self-pay | Admitting: Family Medicine

## 2016-04-07 ENCOUNTER — Encounter: Payer: Medicare Other | Admitting: Family Medicine

## 2016-04-07 DIAGNOSIS — E669 Obesity, unspecified: Principal | ICD-10-CM

## 2016-04-07 DIAGNOSIS — E1169 Type 2 diabetes mellitus with other specified complication: Secondary | ICD-10-CM

## 2016-04-07 MED ORDER — PANTOPRAZOLE SODIUM 40 MG PO TBEC: 40.0000 mg | DELAYED_RELEASE_TABLET | Freq: Every day | ORAL | Status: DC

## 2016-04-07 MED ORDER — FUROSEMIDE 20 MG PO TABS: 20.0000 mg | ORAL_TABLET | Freq: Every day | ORAL | Status: DC

## 2016-04-07 MED ORDER — METFORMIN HCL 1000 MG PO TABS: ORAL_TABLET | ORAL | Status: DC

## 2016-04-07 MED ORDER — LORATADINE 10 MG PO TABS: 10.0000 mg | ORAL_TABLET | Freq: Every day | ORAL | Status: DC

## 2016-04-07 MED ORDER — BISOPROLOL FUMARATE 5 MG PO TABS: 2.5000 mg | ORAL_TABLET | Freq: Every day | ORAL | Status: DC

## 2016-04-07 MED ORDER — SENNOSIDES-DOCUSATE SODIUM 8.6-50 MG PO TABS: 3.0000 | ORAL_TABLET | Freq: Two times a day (BID) | ORAL | Status: DC | PRN

## 2016-04-07 MED ORDER — INSULIN GLARGINE 100 UNIT/ML SOLOSTAR PEN: 15.0000 [IU] | PEN_INJECTOR | Freq: Every day | SUBCUTANEOUS | Status: DC

## 2016-04-07 MED ORDER — LOSARTAN POTASSIUM 25 MG PO TABS: 12.5000 mg | ORAL_TABLET | Freq: Every day | ORAL | Status: DC

## 2016-04-07 NOTE — Telephone Encounter (Signed)
Patient has switched pharmacy to [facility]. All refills sent to this pharmacy today 6/19 via e-script, also faxed them copy of rx update. Discontinued Famotidine, continue Pantoprazole.  Saralyn Pilar, DO [facility], PGY-3

## 2016-04-07 NOTE — Telephone Encounter (Signed)
All up to date rx sent e-script to [facility] in [place]. Completed the paperwork with up to date rx to be faxed back to them today 04/07/16. Discontinued Famotidine, Continue Pantoprazole. Meds not refilled (rx by specialist - Prednisone taper and Mycophenalate)  Saralyn Pilar, DO [facility], PGY-3

## 2016-04-08 ENCOUNTER — Telehealth: Payer: Self-pay | Admitting: Cardiology

## 2016-04-08 ENCOUNTER — Ambulatory Visit (INDEPENDENT_AMBULATORY_CARE_PROVIDER_SITE_OTHER): Payer: Medicare Other | Admitting: *Deleted

## 2016-04-08 ENCOUNTER — Telehealth: Payer: Self-pay | Admitting: *Deleted

## 2016-04-08 ENCOUNTER — Ambulatory Visit: Payer: Medicare Other | Admitting: Internal Medicine

## 2016-04-08 DIAGNOSIS — M069 Rheumatoid arthritis, unspecified: Secondary | ICD-10-CM | POA: Diagnosis not present

## 2016-04-08 DIAGNOSIS — I10 Essential (primary) hypertension: Secondary | ICD-10-CM | POA: Diagnosis not present

## 2016-04-08 DIAGNOSIS — I442 Atrioventricular block, complete: Secondary | ICD-10-CM | POA: Diagnosis not present

## 2016-04-08 DIAGNOSIS — E119 Type 2 diabetes mellitus without complications: Secondary | ICD-10-CM | POA: Diagnosis not present

## 2016-04-08 DIAGNOSIS — J45909 Unspecified asthma, uncomplicated: Secondary | ICD-10-CM | POA: Diagnosis not present

## 2016-04-08 DIAGNOSIS — I5020 Unspecified systolic (congestive) heart failure: Secondary | ICD-10-CM | POA: Diagnosis not present

## 2016-04-08 DIAGNOSIS — L930 Discoid lupus erythematosus: Secondary | ICD-10-CM | POA: Diagnosis not present

## 2016-04-08 NOTE — Telephone Encounter (Signed)
Called to speak with Pharmacist at [facility] to clarify the losartan dosage.  Per Dr. Althea Charon; patient was discharged on the 12.5 mg dosage 02/2016.  Patient has an upcoming appointment Thursday 04/10/16 with cardiologist.  Clovis Pu, RN

## 2016-04-08 NOTE — Telephone Encounter (Signed)
Appropriate instructions are Losartan 25mg  tablets, take 0.5 tab for dose 12.5mg  daily. I attempted to call [facility], had difficulty getting through.  Could you contact them and update this rx information? Thank you.  , DO [facility], PGY-3

## 2016-04-08 NOTE — Telephone Encounter (Signed)
LMOVM reminding pt to send remote transmission.   

## 2016-04-08 NOTE — Telephone Encounter (Signed)
Pharmacist from [facility] called needing clarification in the directions of Losartan.  Rx sent on 04/07/16 stated Take 0.5 tablets (12.5 mg total) by mouth daily; previous direction was take 1 tab BID.  Please give them a call at [phone].  Clovis Pu, RN

## 2016-04-09 DIAGNOSIS — M069 Rheumatoid arthritis, unspecified: Secondary | ICD-10-CM | POA: Diagnosis not present

## 2016-04-09 DIAGNOSIS — L930 Discoid lupus erythematosus: Secondary | ICD-10-CM | POA: Diagnosis not present

## 2016-04-09 DIAGNOSIS — E119 Type 2 diabetes mellitus without complications: Secondary | ICD-10-CM | POA: Diagnosis not present

## 2016-04-09 DIAGNOSIS — I5020 Unspecified systolic (congestive) heart failure: Secondary | ICD-10-CM | POA: Diagnosis not present

## 2016-04-09 DIAGNOSIS — I10 Essential (primary) hypertension: Secondary | ICD-10-CM | POA: Diagnosis not present

## 2016-04-09 DIAGNOSIS — J45909 Unspecified asthma, uncomplicated: Secondary | ICD-10-CM | POA: Diagnosis not present

## 2016-04-09 LAB — CUP PACEART REMOTE DEVICE CHECK
Battery Voltage: 2.97 V
Brady Statistic AP VP Percent: 5.02 %
Brady Statistic RA Percent Paced: 5.04 %
Brady Statistic RV Percent Paced: 99.48 %
Date Time Interrogation Session: 20170620190418
Implantable Lead Implant Date: 19971017
Implantable Lead Location: 753859
Implantable Lead Model: 4196
Lead Channel Impedance Value: 247 Ohm
Lead Channel Impedance Value: 323 Ohm
Lead Channel Impedance Value: 361 Ohm
Lead Channel Impedance Value: 399 Ohm
Lead Channel Impedance Value: 570 Ohm
Lead Channel Pacing Threshold Amplitude: 0.5 V
Lead Channel Pacing Threshold Pulse Width: 0.4 ms
Lead Channel Sensing Intrinsic Amplitude: 4.5 mV
Lead Channel Setting Pacing Amplitude: 2 V
Lead Channel Setting Pacing Pulse Width: 0.4 ms
Lead Channel Setting Sensing Sensitivity: 0.9 mV
MDC IDC LEAD IMPLANT DT: 19971017
MDC IDC LEAD IMPLANT DT: 20130520
MDC IDC LEAD LOCATION: 753858
MDC IDC LEAD LOCATION: 753860
MDC IDC MSMT BATTERY REMAINING LONGEVITY: 40 mo
MDC IDC MSMT LEADCHNL LV IMPEDANCE VALUE: 342 Ohm
MDC IDC MSMT LEADCHNL LV PACING THRESHOLD AMPLITUDE: 0.75 V
MDC IDC MSMT LEADCHNL LV PACING THRESHOLD PULSEWIDTH: 0.4 ms
MDC IDC MSMT LEADCHNL RA IMPEDANCE VALUE: 285 Ohm
MDC IDC MSMT LEADCHNL RA PACING THRESHOLD PULSEWIDTH: 0.4 ms
MDC IDC MSMT LEADCHNL RA SENSING INTR AMPL: 4.5 mV
MDC IDC MSMT LEADCHNL RV IMPEDANCE VALUE: 209 Ohm
MDC IDC MSMT LEADCHNL RV IMPEDANCE VALUE: 228 Ohm
MDC IDC MSMT LEADCHNL RV PACING THRESHOLD AMPLITUDE: 1.375 V
MDC IDC MSMT LEADCHNL RV SENSING INTR AMPL: 6.125 mV
MDC IDC MSMT LEADCHNL RV SENSING INTR AMPL: 6.125 mV
MDC IDC SET LEADCHNL LV PACING AMPLITUDE: 1.75 V
MDC IDC SET LEADCHNL LV PACING PULSEWIDTH: 0.4 ms
MDC IDC SET LEADCHNL RV PACING AMPLITUDE: 2 V
MDC IDC STAT BRADY AP VS PERCENT: 0.02 %
MDC IDC STAT BRADY AS VP PERCENT: 94.46 %
MDC IDC STAT BRADY AS VS PERCENT: 0.5 %

## 2016-04-09 NOTE — Progress Notes (Signed)
Remote pacemaker transmission.   

## 2016-04-10 ENCOUNTER — Encounter: Payer: Medicare Other | Admitting: Physical Medicine & Rehabilitation

## 2016-04-10 DIAGNOSIS — E119 Type 2 diabetes mellitus without complications: Secondary | ICD-10-CM | POA: Diagnosis not present

## 2016-04-10 DIAGNOSIS — M069 Rheumatoid arthritis, unspecified: Secondary | ICD-10-CM | POA: Diagnosis not present

## 2016-04-10 DIAGNOSIS — I10 Essential (primary) hypertension: Secondary | ICD-10-CM | POA: Diagnosis not present

## 2016-04-10 DIAGNOSIS — J45909 Unspecified asthma, uncomplicated: Secondary | ICD-10-CM | POA: Diagnosis not present

## 2016-04-10 DIAGNOSIS — L930 Discoid lupus erythematosus: Secondary | ICD-10-CM | POA: Diagnosis not present

## 2016-04-10 DIAGNOSIS — I5020 Unspecified systolic (congestive) heart failure: Secondary | ICD-10-CM | POA: Diagnosis not present

## 2016-04-11 ENCOUNTER — Encounter: Payer: Self-pay | Admitting: Cardiology

## 2016-04-14 ENCOUNTER — Ambulatory Visit (INDEPENDENT_AMBULATORY_CARE_PROVIDER_SITE_OTHER): Payer: Medicare Other | Admitting: Family Medicine

## 2016-04-14 VITALS — BP 108/78 | HR 110 | Temp 97.9°F | Wt 153.0 lb

## 2016-04-14 DIAGNOSIS — B351 Tinea unguium: Secondary | ICD-10-CM

## 2016-04-14 DIAGNOSIS — R350 Frequency of micturition: Secondary | ICD-10-CM | POA: Diagnosis not present

## 2016-04-14 DIAGNOSIS — N644 Mastodynia: Secondary | ICD-10-CM | POA: Diagnosis not present

## 2016-04-14 DIAGNOSIS — E118 Type 2 diabetes mellitus with unspecified complications: Secondary | ICD-10-CM | POA: Diagnosis not present

## 2016-04-14 DIAGNOSIS — Z794 Long term (current) use of insulin: Secondary | ICD-10-CM

## 2016-04-14 LAB — POCT UA - MICROSCOPIC ONLY

## 2016-04-14 LAB — POCT URINALYSIS DIPSTICK
Bilirubin, UA: NEGATIVE
GLUCOSE UA: 500
Ketones, UA: NEGATIVE
LEUKOCYTES UA: NEGATIVE
Nitrite, UA: NEGATIVE
PROTEIN UA: NEGATIVE
Spec Grav, UA: 1.015
UROBILINOGEN UA: 0.2
pH, UA: 6.5

## 2016-04-14 NOTE — Progress Notes (Signed)
Subjective:    Patient ID: Makayla Vasquez, female    DOB: 10-20-1981, 35 y.o.   MRN: [id number]  Makayla Vasquez is a 35 y.o. female presenting on 04/14/2016 for UTI Symptoms / Breast Pain  HPI   URINARY FREQUENCY / UTI SYMPTOMS - Reports increased urinary frequency and nocturia x 1 week, not seem to be improving. Not tried any medicines for it. Feels similar to prior UTI. Does admit CBGs improved control on lower prednisone down to 20mg  daily for 1 month then 10mg  for 1 month. - Admits urinary odor Denies dysuria, hematuria or urine color change, abdominal pain, flank pain, fevers/chills  RIGHT BREAST PAIN, without lump or mass: - Reports Right sided generalized breast tenderness, no mass or lump, abnormal nipple discharge. Present for months and thinks it is due to significant weight loss, but recently started to significantly worsen recently. Requests referral to [facility]. - No known family history of breast cancer  ONYCHOMYCOSIS: Prior history with R-great toenail thickened onychomycosis back in 11/2014, trial of topical anti-fungal otc unsuccesful, attempted rx strength however not covered by ins patient unable to afford. No other treatment, now reports all toenails thickened and brittle, she lost her 5th digit toenails. - Denies any foot ulceration or numbness, tingling  Social History  Substance Use Topics  . Smoking status: Former Smoker -- 0.25 packs/day for .5 years    Types: Cigarettes    Quit date: 07/25/1996  . Smokeless tobacco: Never Used  . Alcohol Use: No    Review of Systems Per HPI unless specifically indicated above     Objective:    BP 108/78 mmHg  Pulse 110  Temp(Src) 97.9 F (36.6 C) (Oral)  Wt 153 lb (69.4 kg)  SpO2 90%  Wt Readings from Last 3 Encounters:  04/14/16 153 lb (69.4 kg)  03/18/16 145 lb (65.772 kg)  02/26/16 159 lb 13.3 oz (72.5 kg)    Physical Exam  Constitutional: She appears well-developed and well-nourished. No  distress.  Chronically ill appearing, currently well, comfortable, cooperative  HENT:  Head: Normocephalic and atraumatic.  Mouth/Throat: Oropharynx is clear and moist.  Supplemental O2 portable in place  Cardiovascular: Normal heart sounds.   No murmur heard. Tachycardic  Pulmonary/Chest: Effort normal.  Abdominal: Soft. Bowel sounds are normal. She exhibits no distension and no mass. There is no tenderness. There is no rebound and no guarding.  Musculoskeletal: She exhibits no edema.  No back pain or CVAT  Neurological: She is alert.  Skin: Skin is warm and dry. She is not diaphoretic.  Feet with dry thickened toenails, missing 5th digit toenail on each side. Some callus formation. No ulceration. No sign of infection  Right Breast - Deferred exam today.  Nursing note and vitals reviewed.   DM FOOT EXAM - Bilateral toenail onychomycosis with thick brittle toenails, missing bilateral 5th toenails. Some dry callus skin. No ulcerations. Intact sensation to monofilament bilaterally     Assessment & Plan:   Problem List Items Addressed This Visit    Type 2 diabetes mellitus with complication, with long-term current use of insulin (HCC)    Not due for A1c today, seems CBGs gradual improved control now tapered down chronic prednisone per rheum to 20mg  daily x 1 month then 10mg  for 1 month from now. - DM foot exam today normal except some dry callus skin with onychomycosis, referral to Podiatry - Follow-up 2 months for A1c, titrate insulin as taper prednisone  Relevant Orders   Ambulatory referral to Podiatry   Onychomycosis    Bilateral worsening onychomycosis and some brittle toenails with loss of 5th digit toenail, concern in DM patient, prior failed topical anti fungal therapy - Referral to Podiatry      Relevant Orders   Ambulatory referral to Podiatry   Breast pain, right    Generalized Right sided breast pain, attributed to recent dramatic weight loss, without mass,  lump or red flag symptoms. Patient concerned, deferred breast exam requested imaging today. Unlikely malignancy given lack of history. - Ordered R limited breast US at breast center, follow-up      Relevant Orders   US BREAST LTD UNI RIGHT INC AXILLA    Other Visit Diagnoses    Urinary frequency    -  Primary    Unlikely UTI, unremarkable UA, some RBCs, likely freq due to DM with 500 glucose, check urine culture, follow-up    Relevant Orders    POCT urinalysis dipstick (Completed)    POCT UA - Microscopic Only (Completed)    Urine culture       No orders of the defined types were placed in this encounter.      Follow up plan: Return in about 2 months (around 06/14/2016) for physical, , diabetes.   Saralyn Pilar, DO [facility], PGY-3

## 2016-04-15 ENCOUNTER — Encounter: Payer: Self-pay | Admitting: Internal Medicine

## 2016-04-15 ENCOUNTER — Ambulatory Visit (INDEPENDENT_AMBULATORY_CARE_PROVIDER_SITE_OTHER): Payer: Medicare Other | Admitting: Internal Medicine

## 2016-04-15 ENCOUNTER — Encounter: Payer: Self-pay | Admitting: Family Medicine

## 2016-04-15 VITALS — BP 118/76 | HR 91 | Ht 65.0 in | Wt 153.8 lb

## 2016-04-15 DIAGNOSIS — I5022 Chronic systolic (congestive) heart failure: Secondary | ICD-10-CM | POA: Diagnosis not present

## 2016-04-15 DIAGNOSIS — J9611 Chronic respiratory failure with hypoxia: Secondary | ICD-10-CM

## 2016-04-15 DIAGNOSIS — D8989 Other specified disorders involving the immune mechanism, not elsewhere classified: Secondary | ICD-10-CM | POA: Diagnosis not present

## 2016-04-15 NOTE — Assessment & Plan Note (Signed)
Bilateral worsening onychomycosis and some brittle toenails with loss of 5th digit toenail, concern in DM patient, prior failed topical anti fungal therapy - Referral to Podiatry

## 2016-04-15 NOTE — Patient Instructions (Signed)
Thank you for coming in to clinic today.  1. No sign of UTI. Checking Urine Culture 2. Referral to [facility] for podiatrist 3. Ordered R breast ultrasound  Please schedule a follow-up appointment with PCP in 2 months for physical, diabetes A1c   If you have any other questions or concerns, please feel free to call the clinic to contact me. You may also schedule an earlier appointment if necessary.  However, if your symptoms get significantly worse, please go to the Emergency Department to seek immediate medical attention.  Saralyn Pilar, DO [facility]

## 2016-04-15 NOTE — Assessment & Plan Note (Signed)
Now using continuous oxygen since hospital stay, prescribed by PCP. She has trouble with a weight of portable tank and asks for a portable oxygen concentrator Plan-DME advanced to evaluate for portable oxygen concentrator 2-4 L/m

## 2016-04-15 NOTE — Progress Notes (Signed)
02/23/14- 36 yoF former smoker Referred by Dr Bridgett Larsson for allergy consult.  C/o itchy, watery eyes, sinus congestion, headaches, prodcough with green mucous X several years. Mother and son are here She had been going to allergist at [facility] for allergic rhinitis, itching eyes and chest tightness for years as an adult. Typically worse in spring and summer, with occasional colds and with exposure to house dust. Daily wheeze.She tried allergy shots for 2 months, 2 years ago but had strong local reactions. 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 Mother has hx allergies. CXR 5/ 8 /14 IMPRESSION:  No active disease. No significant change.  Original Report Authenticated By: Lahoma Crocker, M.D.  04/06/14- 67 yoF former smoker Referred by Dr Bridgett Larsson for allergy consult.  C/o itchy, watery eyes, sinus congestion, headaches, prodcough with green mucous X several years. Mother and son are here FOLLOWS FOR: Allergies have been doing well until outdoors with trees,etc. Allergy profile 02/23/2014-negative with total IgE 22.9. She does give history of allergy shots in the past, stopped because of large local reactions. Past history suggests stronger allergic reactions when she was younger. We can retest her if there is a question She still reports some itching in her throat and cough sometimes green sputum  06/27/14 [facility] follow up  Returns for follow up from recent hospital stay.  Reports breathing is doing well today. Is suppose to be on O2 at 4lm however did not bring O2 b/c   portable tank at home is malfunctioned. DME contacted and at home to fix system. Advised on importance of O2 use.  Says were very low off Oxygen - sats 54%-83% on room air unpon entering the exam room. Sats increased 90% on 4l/m  Admitted last week for significant hypoxia   Patient presented with hypoxemia of unknown etiology, requiring 4L of oxygen. Notably, she was hospitalized from 8/11-8/14 with a  similar presentation and was thought to have a viral pneumonitis complicated by atelectasis and mild edema vs. an autoimmune process and was discharged home on a course of levaquin and prednisone.  Her CT scan from 8/28 showed bilateral progressive lower peribronchial infiltrates with groundglass changes and nodular patter consistent with acute pneumonitis of unclear etiology. During her last admission, she was found to have a positive ANA, mildly elevated ESR, non-reactive HIV, and negative PPD. Also during her last hospitalization, cardiology saw her for a repeat echo that showed a decrease in EF (25-30%, from baseline 30-25% in 2013).  Viral panel was positive for rhinovirus previous admission but neg for this admit .  ESR was elevated 62 , decreased today at 55.  Pulmonology was consulted and did a BAL on 9/1 that showed 60% macrophages and 30% PMNs. Thus far, her AFB smear, fungal smear, legionella Ab, pneumocystis smear, ACE, and sputum culture are negative. Fungal neg , bal cx neg w/ non path oral flora , legionella neg  Has family hx of Lupus.  Anti smith ab neg and anti dsDNA neg  HSP panel was neg.  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 Status: Edited Related Problem: ILD (interstitial lung disease)    #1 bilateral progressive lower lobe peribronchial infiltrates with groundglass changes and nodular pattern consistent with acute pneumonitis of unclear etiology.  Note previous serologic studies show a speckled positive ANA 1-80 pattern and no evidence of  allergy factors or elevated IgE. This inflammatory pneumonitis is not yet discerned as to etiology therefore bronchoscopy would be indicated and will be pursued  Previous viral panel + for rhinovirus  BAL cx neg to date.  HSP neg .  Will cont on steroid challenge with close  follow up  Case discussed with Dr. Young  Clinically she appears stable , will have her back in office with follow up cxr in 1 week as cxr w/ worse aeration today  Plan  Taper Prednisone 40mg daily and hold at this dose.  Continue on Oxygen 4l/m continuous flow 24/7 .  Labs and chest xray today .  Follow up Dr. Young In 1 weeks and As needed  Please contact office for sooner follow up if symptoms do not improve or worsen or seek emergency care    07/06/14-32 yoF former smoker originaly referred for allergy evaluation, rhinitis, asthmatic bronchitis, then hosp for ?ALI with lower zone pneumonitis/ BAL and special studies NEG,. Acute on Chronic respiratory failure. Pacemaker for AV block  O2 4L/ [facility] FOLLOWS FOR: Pt c/o DOE, prod cough with clear mucus and chest tightness when coughing. Pt last saw TP on 06/27/2014 for HFU .   08/07/14- 33 yoF former smoker originaly referred for allergy evaluation, rhinitis, asthmatic bronchitis, then hosp for ?ALI with lower zone pneumonitis/ BAL and special studies NEG,. Acute on Chronic respiratory failure. Pacemaker for AV block  O2 4L/ [facility]      Husband and son are here Continues oxygen but feels comfortable at rest taking it off for short intervals. As instructed, prednisone is now down to 20 mg daily for the last 2 weeks. She feels stable, not worse. Coughs a lot, clear mucus. Denies fever, blood, purulent sputum, edema, adenopathy. Pending cardiac ablation seeking better control of heart rate. CXR 07/06/14 IMPRESSION:  1. No change in aeration to the lungs compared with the previous  exam.  Electronically Signed  By: Taylor Stroud M.D.  On: 07/06/2014 15:56  10/08/14-  33 yoF former smoker originaly referred for allergy evaluation, rhinitis, asthmatic bronchitis, then hosp for ?ALI with lower zone pneumonitis/ BAL and special studies NEG,. Acute on Chronic respiratory failure. Pacemaker for AV block  O2 4L/ [facility]. Has continued prednisone 20 mg  daily. FOLLOW FOR:  hypoxemia; breathing doing fine, wants to get off oxygen; no complaints  Arrived 78% on 2L, 90% on 4L..  Husband and son here. Cough with clear phlegm. Feels better with no acute events. CXR 09/18/14  IMPRESSION: Bibasilar pulmonary infiltrates, improved from 08/07/2014. Electronically Signed  By: Thomas Register  On: 09/18/2014 13:35   12/11/14- 33 yoF former smoker originaly referred for allergy evaluation, rhinitis, asthmatic bronchitis, then hosp for ?ALI with lower zone pneumonitis/ BAL and special studies NEG,. Acute on Chronic respiratory failure/ Hypoxemia,. Pacemaker for AV block  O2 4L/ [facility]. Has continued prednisone 10 mg daily. FOLLOWS FOR: Pt states her breathing is doing well overall and continues to wear O2 through [facility]. CT max fac 10/30/14 IMPRESSION: 32 x 13 x 10 mm abscess in the upper lip area centrally. Inflamed/hyperplastic neck nodes bilaterally. No significant dental disease or sinus disease. Electronically Signed  By: Mark Gallerani M.D.  On: 10/30/2014 14:31  02/09/15- 33 yoF former smoker originaly referred for allergy evaluation, rhinitis, asthmatic bronchitis, then hosp for ?ALI with lower zone pneumonitis/ BAL and special studies NEG,. Acute on Chronic respiratory failure/ Hypoxemia,.DM Pacemaker for AV block  O2 2L/ [facility]. Has continued prednisone 10 mg daily. FOLLOWS FOR:   Pt states she is doing well; Wears O2 as needed during the day and at night. DME -[facility]. Husband and son here Continues prednisone 10 mg daily She says she is doing "great" with little cough and no acute events. Her oxygen at 2 L/m prevents her return to work but she hopes to get back quickly. Walk test on room air: 44/22/16: 98%, 92%, 91%, 90% at rest on room air. Pulse rate ranged 74-107. CXR 12/11/14-- I reviewed images. Coarse prominent lung markings in lower third bilaterally. Significantly improved from acute picture. IMPRESSION: 1. Persistent lung base  opacity similar to prior studies. The etiology of this is unclear, but given the lack of symptoms, it is likely chronic. 2. No convincing acute cardiopulmonary disease. Electronically Signed  By: Lajean Manes M.D.  On: 12/11/2014 13:36  04/19/15- 60 yoF former smoker originaly referred for allergy evaluation, rhinitis, asthmatic bronchitis, then hosp for ?ALI with lower zone pneumonitis/ BAL and special studies NEG,. Acute on Chronic respiratory failure/ Hypoxemia,.DM Pacemaker for AV block  O2 2L/ [facility]. FOLLOWS FOR Pt states she is doing well. Wears O2 2L as needed during the day and at night. DME- [facility] Now prednisone 5 mg every other day. Notices a little postnasal drip but denies cough, wheeze, chest pain. CXR 02/09/15-  IMPRESSION: Chronic changes at the lung bases. There is no active cardiopulmonary disease. Electronically Signed  By: David Martinique M.D.  On: 02/09/2015 15:08  08/20/15- 24 yoF former smoker originaly referred for allergy evaluation, rhinitis, asthmatic bronchitis, then hosp 2015 for ?ALI with lower zone pneumonitis/ BAL and special studies NEG,. Lupus,  Acute on Chronic respiratory failure/ Hypoxemia,.DM Pacemaker for AV block  O2 2L/ [facility]. Follows for: Chronic Respiratory Failure with Hypoxia. Pt c/o wet cough with thick clear mucus. Pt denies any wheeze or increased SOB.  Since last here she has felt breathing stable. Nasal stuffiness and postnasal drainage. Minor dry cough. Continuous oxygen 2 L/[facility]. Denies chest pain, nodes, fever, sweat, rash. Knees gets sore. Being followed for lupus on CellCept and daily maintenance prednisone 10 mg by rheumatologist/ Dr. Kandice Robinsons. I reviewed CT chest images with her at this visit CT chest  07/08/15 IMPRESSION: Study is limited by patient body habitus and by bilateral lower lobe consolidation. No pulmonary emboli detected but evaluation of third order branches particularly in the lower lobes is limited. Severe  bilaterally symmetric ground-glass consolidation. No significant pleural effusion. Appearance is similar to 06/16/2014. The apparent chronic nature and absence of pleural fluid suggests that this may not be due to pulmonary edema. Consider possibility of chronic lung disease including various entities related to interstitial pneumonitis as well as autoimmune disorders. Electronically Signed  By: Skipper Cliche M.D.  On: 07/08/2015 07:34  04/15/2016-33 yoF former smoker originaly referred for allergy evaluation, rhinitis, asthmatic bronchitis, then hosp 2015 for ?ALI with lower zone pneumonitis/ BAL and special studies NEG,. Lupus,  Acute on Chronic respiratory failure/ Hypoxemia,.DM Pacemaker for AV block   O2 2L/ [facility]. LOV 3/20- NP-  Continued prednisone, added flonase and claritin for allergic rhinitis. FOLLOW FOR: breathing is doing well except when it is very hot.  Wants to get a portable concentrator. O2 sat today 96% on 2L Hosp 02/21/16- Sepsis,  Continued Cellcept and prednisone per Rheumatology, + anti JO-1 antibodies for auto-immune myositis Notices a little cough at night, nonproductive. Still easy dyspnea on exertion especially if she doesn't use her oxygen. Overall feels much better than when she was in hospital in May. CT chest 02/22/2016 IMPRESSION:  Interstitial and patchy alveolar opacity, felt to most likely represent pulmonary edema. There is cardiomegaly. There is felt to be a degree of congestive heart failure. Note that a degree of superimposed pneumonia cannot be excluded on this study. Adenopathy which may well be reactive in etiology. Gallbladder absent. Electronically Signed  By: Lowella Grip III M.D.  On: 02/22/2016 17:13  ROS-see HPI Constitutional:   + weight loss, night sweats, fevers, chills, fatigue, lassitude. HEENT:   , difficulty swallowing, tooth/dental problems, sore throat,       sneezing, itching, ear ache, nasal congestion, + post  nasal drip,  CV:  No-   chest pain, orthopnea, PND, swelling in lower extremities, anasarca,                                                      dizziness, palpitations Resp: + shortness of breath with exertion or at rest.              productive cough, + non-productive cough,  No- coughing up of blood.               No- wheezing.   Skin: No-   rash or lesions. GI:   heartburn, indigestion, abdominal pain, nausea, vomiting,  GU:  MS:  No-   joint pain or swelling.   Neuro-     nothing unusual Psych:  No- change in mood or affect. No depression or anxiety.  No memory loss.  OBJ- Physical Exam   O2 sat 98% on 2 L General- Alert, Oriented, Affect-appropriate, Distress- none acute,  Skin- +acanthotic hyperpigmentation around neck Lymphadenopathy- none Head- atraumatic            Eyes- Gross vision intact, PERRLA, conjunctivae and secretions clear            Ears- Hearing, canals-normal            Nose-  no-Septal dev, mucus, polyps, erosion, perforation             Throat- Mallampati III , mucosa clear , drainage- none, tonsils- atrophic Neck- flexible , trachea midline, no stridor , thyroid nl, carotid no bruit Chest - symmetrical excursion , unlabored           Heart/CV- RRR , no murmur , no gallop  , no rub, nl s1 s2                           - JVD- none , edema- none, stasis changes- none, varices- none           Lung- clear to P&A diminished in bases, wheeze- none, cough-none , dullness-none, rub- none.           Chest wall-  Abd-  Br/ Gen/ Rectal- Not done, not indicated Extrem- cyanosis- none, clubbing, none, atrophy- none, strength- nl Neuro- grossly intact to observation

## 2016-04-15 NOTE — Assessment & Plan Note (Signed)
Rheumatologist in [facility] is managing both CellCept and prednisone. I clarified this with her to make sure she did not have too many cooks.

## 2016-04-15 NOTE — Patient Instructions (Signed)
Your Rheumatologist at [facility] is in charge of managing your prednisone and your Cellcept.  Order- DME [facility]- please evaluate for portable O2 concentrator 2-4 L/ m for dx chronic hypoxic respiratory failure, Lupus  Please call as needed

## 2016-04-15 NOTE — Assessment & Plan Note (Signed)
Not due for A1c today, seems CBGs gradual improved control now tapered down chronic prednisone per rheum to 20mg  daily x 1 month then 10mg  for 1 month from now. - DM foot exam today normal except some dry callus skin with onychomycosis, referral to Podiatry - Follow-up 2 months for A1c, titrate insulin as taper prednisone

## 2016-04-15 NOTE — Assessment & Plan Note (Signed)
CT chest in May at time of hospital stay showed fluid overload/edema which is not evident on clinical exam at this visit. She has lost some weight, some of which may be water weight.

## 2016-04-15 NOTE — Assessment & Plan Note (Signed)
Generalized Right sided breast pain, attributed to recent dramatic weight loss, without mass, lump or red flag symptoms. Patient concerned, deferred breast exam requested imaging today. Unlikely malignancy given lack of history. - Ordered R limited breast US at breast center, follow-up

## 2016-04-16 ENCOUNTER — Telehealth: Payer: Self-pay | Admitting: Family Medicine

## 2016-04-16 LAB — URINE CULTURE

## 2016-04-16 NOTE — Telephone Encounter (Signed)
Last office visit 04/14/16 for urinary frequency. UA not suggestive of UTI. Checked Urine culture, results today with only 9k CFU and insignificant growth. Negative for urinary tract infection. Suspected urinary symptoms due to elevated CBGs which can cause irritation and frequency. Continue to treat Diabetes. No further treatment at this time. Also on 6/26 ordered Right breast US, follow-up with this in future, patient should be contacted to schedule.  Attempted to call patient, unable to reach and no voicemail. If calls back, please let her know urine culture negative.  Saralyn Pilar, DO [facility], PGY-3

## 2016-04-17 ENCOUNTER — Encounter: Payer: Medicare Other | Attending: Physical Medicine & Rehabilitation | Admitting: Physical Medicine & Rehabilitation

## 2016-04-17 DIAGNOSIS — J45909 Unspecified asthma, uncomplicated: Secondary | ICD-10-CM | POA: Diagnosis not present

## 2016-04-17 DIAGNOSIS — J9691 Respiratory failure, unspecified with hypoxia: Secondary | ICD-10-CM | POA: Insufficient documentation

## 2016-04-17 DIAGNOSIS — I110 Hypertensive heart disease with heart failure: Secondary | ICD-10-CM | POA: Insufficient documentation

## 2016-04-17 DIAGNOSIS — I5020 Unspecified systolic (congestive) heart failure: Secondary | ICD-10-CM | POA: Diagnosis not present

## 2016-04-17 DIAGNOSIS — I10 Essential (primary) hypertension: Secondary | ICD-10-CM | POA: Diagnosis not present

## 2016-04-17 DIAGNOSIS — M329 Systemic lupus erythematosus, unspecified: Secondary | ICD-10-CM | POA: Insufficient documentation

## 2016-04-17 DIAGNOSIS — R269 Unspecified abnormalities of gait and mobility: Secondary | ICD-10-CM | POA: Insufficient documentation

## 2016-04-17 DIAGNOSIS — E119 Type 2 diabetes mellitus without complications: Secondary | ICD-10-CM | POA: Diagnosis not present

## 2016-04-17 DIAGNOSIS — R000 Tachycardia, unspecified: Secondary | ICD-10-CM | POA: Insufficient documentation

## 2016-04-17 DIAGNOSIS — I5022 Chronic systolic (congestive) heart failure: Secondary | ICD-10-CM | POA: Insufficient documentation

## 2016-04-17 DIAGNOSIS — E1142 Type 2 diabetes mellitus with diabetic polyneuropathy: Secondary | ICD-10-CM | POA: Insufficient documentation

## 2016-04-17 DIAGNOSIS — A419 Sepsis, unspecified organism: Secondary | ICD-10-CM | POA: Insufficient documentation

## 2016-04-17 DIAGNOSIS — G720 Drug-induced myopathy: Secondary | ICD-10-CM | POA: Insufficient documentation

## 2016-04-17 DIAGNOSIS — M069 Rheumatoid arthritis, unspecified: Secondary | ICD-10-CM | POA: Diagnosis not present

## 2016-04-17 DIAGNOSIS — L930 Discoid lupus erythematosus: Secondary | ICD-10-CM | POA: Diagnosis not present

## 2016-04-17 DIAGNOSIS — Z09 Encounter for follow-up examination after completed treatment for conditions other than malignant neoplasm: Secondary | ICD-10-CM | POA: Insufficient documentation

## 2016-04-25 ENCOUNTER — Telehealth: Payer: Self-pay | Admitting: Internal Medicine

## 2016-04-25 DIAGNOSIS — J9611 Chronic respiratory failure with hypoxia: Secondary | ICD-10-CM

## 2016-04-25 NOTE — Telephone Encounter (Signed)
Per 04/15/16 OV: Patient Instructions       Your Rheumatologist at [facility] is in charge of managing your prednisone and your Cellcept.  Order- DME [facility]- please evaluate for portable O2 concentrator 2-4 L/ m for dx chronic hypoxic respiratory failure, Lupus  Please call as needed  --  Called spoke with pt. She is requesting to change her DME from [facility] to [facility] for her O2. Dr. Maple Hudson please advise if okay? thanks

## 2016-04-28 DIAGNOSIS — Z79899 Other long term (current) drug therapy: Secondary | ICD-10-CM | POA: Diagnosis not present

## 2016-04-28 DIAGNOSIS — D8989 Other specified disorders involving the immune mechanism, not elsewhere classified: Secondary | ICD-10-CM | POA: Diagnosis not present

## 2016-04-28 NOTE — Telephone Encounter (Signed)
Called spoke with pt. Informed her of CY's recs. She voiced understanding and had no further questions. Order has been placed. Nothing further needed.

## 2016-04-28 NOTE — Telephone Encounter (Signed)
Ok to work with [facility] to make this change

## 2016-04-29 ENCOUNTER — Telehealth: Payer: Self-pay | Admitting: Internal Medicine

## 2016-04-29 DIAGNOSIS — J9611 Chronic respiratory failure with hypoxia: Secondary | ICD-10-CM

## 2016-04-29 NOTE — Telephone Encounter (Signed)
Spoke with Marchelle Folks at [facility], states that pt's 02 order needs to be placed with the new 02 template.  This has been placed.  Nothing further needed.

## 2016-05-13 ENCOUNTER — Telehealth: Payer: Self-pay | Admitting: Internal Medicine

## 2016-05-13 DIAGNOSIS — IMO0001 Reserved for inherently not codable concepts without codable children: Secondary | ICD-10-CM

## 2016-05-13 DIAGNOSIS — D8989 Other specified disorders involving the immune mechanism, not elsewhere classified: Secondary | ICD-10-CM

## 2016-05-13 NOTE — Telephone Encounter (Signed)
Pt would like a refill on her percocet and for someone to call her when it is ready to be picked up. ep

## 2016-05-14 ENCOUNTER — Ambulatory Visit (HOSPITAL_COMMUNITY)
Admission: RE | Admit: 2016-05-14 | Discharge: 2016-05-14 | Disposition: A | Discharge status: Home / Self Care | Payer: Medicare Other | Source: Ambulatory Visit | Attending: Cardiology | Admitting: Cardiology

## 2016-05-14 ENCOUNTER — Encounter (HOSPITAL_COMMUNITY): Payer: Self-pay

## 2016-05-14 VITALS — BP 94/60 | HR 114 | Wt 146.2 lb

## 2016-05-14 DIAGNOSIS — Z87891 Personal history of nicotine dependence: Secondary | ICD-10-CM | POA: Diagnosis not present

## 2016-05-14 DIAGNOSIS — Z950 Presence of cardiac pacemaker: Secondary | ICD-10-CM | POA: Insufficient documentation

## 2016-05-14 DIAGNOSIS — I5032 Chronic diastolic (congestive) heart failure: Secondary | ICD-10-CM

## 2016-05-14 DIAGNOSIS — I5042 Chronic combined systolic (congestive) and diastolic (congestive) heart failure: Secondary | ICD-10-CM | POA: Diagnosis present

## 2016-05-14 DIAGNOSIS — I110 Hypertensive heart disease with heart failure: Secondary | ICD-10-CM | POA: Diagnosis not present

## 2016-05-14 DIAGNOSIS — Z7951 Long term (current) use of inhaled steroids: Secondary | ICD-10-CM | POA: Diagnosis not present

## 2016-05-14 DIAGNOSIS — I5022 Chronic systolic (congestive) heart failure: Secondary | ICD-10-CM

## 2016-05-14 DIAGNOSIS — J849 Interstitial pulmonary disease, unspecified: Secondary | ICD-10-CM | POA: Diagnosis not present

## 2016-05-14 DIAGNOSIS — Z7952 Long term (current) use of systemic steroids: Secondary | ICD-10-CM | POA: Insufficient documentation

## 2016-05-14 DIAGNOSIS — R531 Weakness: Secondary | ICD-10-CM | POA: Insufficient documentation

## 2016-05-14 DIAGNOSIS — E119 Type 2 diabetes mellitus without complications: Secondary | ICD-10-CM | POA: Insufficient documentation

## 2016-05-14 DIAGNOSIS — M329 Systemic lupus erythematosus, unspecified: Secondary | ICD-10-CM | POA: Insufficient documentation

## 2016-05-14 DIAGNOSIS — Z79899 Other long term (current) drug therapy: Secondary | ICD-10-CM | POA: Insufficient documentation

## 2016-05-14 DIAGNOSIS — Z794 Long term (current) use of insulin: Secondary | ICD-10-CM | POA: Insufficient documentation

## 2016-05-14 DIAGNOSIS — M069 Rheumatoid arthritis, unspecified: Secondary | ICD-10-CM | POA: Diagnosis not present

## 2016-05-14 DIAGNOSIS — Z8249 Family history of ischemic heart disease and other diseases of the circulatory system: Secondary | ICD-10-CM | POA: Insufficient documentation

## 2016-05-14 DIAGNOSIS — I428 Other cardiomyopathies: Secondary | ICD-10-CM | POA: Diagnosis not present

## 2016-05-14 DIAGNOSIS — R000 Tachycardia, unspecified: Secondary | ICD-10-CM | POA: Diagnosis not present

## 2016-05-14 DIAGNOSIS — Q246 Congenital heart block: Secondary | ICD-10-CM | POA: Insufficient documentation

## 2016-05-14 DIAGNOSIS — K219 Gastro-esophageal reflux disease without esophagitis: Secondary | ICD-10-CM | POA: Diagnosis not present

## 2016-05-14 MED ORDER — FUROSEMIDE 20 MG PO TABS: 20.0000 mg | ORAL_TABLET | ORAL | 5 refills | Status: DC

## 2016-05-14 MED ORDER — OXYCODONE-ACETAMINOPHEN 5-325 MG PO TABS: 1.0000 | ORAL_TABLET | Freq: Four times a day (QID) | ORAL | 0 refills | Status: DC | PRN

## 2016-05-14 NOTE — Telephone Encounter (Signed)
Will refill once. Patient has not been seen since 5/30 for this problem and needs to be seen again before will write another refill. Patient ideally should have pain controlled by rheumatologist. If requests pain again, will discuss this with her.

## 2016-05-14 NOTE — Patient Instructions (Addendum)
Take your Lasix (Furosemide) every other day   Your physician has requested that you have an echocardiogram. Echocardiography is a painless test that uses sound waves to create images of your heart. It provides your doctor with information about the size and shape of your heart and how well your heart's chambers and valves are working. This procedure takes approximately one hour. There are no restrictions for this procedure.  We will contact you in 3 months to schedule your next appointment.

## 2016-05-15 NOTE — Progress Notes (Signed)
Patient ID: Makayla Vasquez, female   DOB: 1981/04/26, 35 y.o.   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   She has a h/o congenital HB and underwent first PPM at age 35. In 2011 had ECHO with EF 35-40%. LV dysfunction felt to be due to RV pacing so upgraded to CRT-P. Recent echo in 10/15 with EF 25%. She has had worsening HF symptoms and referred for evaluation for advanced HF therapies. She also has concomitant lung disease and this has limited b-blocker dosing.  She follows with Dr. Annamaria Vasquez for her lung disease. She is a former smoker.  Admitted in 8/15 and 9/15 with worsening respiratory status and severe hypoxia. Had extensive w/u. Thought to have a viral pneumonitis complicated by atelectasis and mild edema vs. an autoimmune process. Discharged home on a course of levaquin and prednisone.   She was offered a lung biopsy for further diagnosis versus empiric treatment with steroids. She opted for empiric steroid treatment. She is followed by a rheumatologist and is on Cellcept and prednisone taper. 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  She is on oxygen and night and with activity.   Last echo in 1/17 showed EF 40-45%, mild LVH, diffuse hypokinesis.    Admission in 5/17 for ?sepsis and management of interstitial lung disease.   She returns for follow up. 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  She denies orthopnea, PND, chest pain.  She does not get short of breath but not very active because of leg weakness => walks only short distances.  Weight is down 11 lbs.  She has not taken her cardiac meds today.  I am not sure she is very compliant with them.   Optivol: fluid index > threshold for a long period but recently < threshold with  impedance trending up.   ECG (3/17) with NSR, BiV pacing rate 95  PFTs (12/15) showed a severe restrictive defect concerning for an interstitial process.   EP study 10/15 for persistent tachycardia and found to have sinus tachycardia.   06/16/14 CT scan showed bilateral progressive lower peribronchial infiltrates with groundglass changes and nodular patter consistent with acute pneumonitis of unclear etiology.  06/20/14 BAL showed 60% macrophages and 30% PMNs. Her AFB smear, fungal smear, legionella Ab, pneumocystis smear, ACE, and sputum culture are negative. Her AFB, legionella, fungal, and BAL culture as well as her fungal Ab, hypersensitivity pneumonitis panel were all negative Serology: DsDNA, RF, ANCA, HSP all negative  RHC 10/26/2014  RA = 2 RV = 27/1/5 PA = 29/7 (18) PCW = 4 Fick cardiac output/index = 4.5/2.2 PVR = 3.0 WU FA sat = 98% PA sat = 64%, 61%  Labs (10/15): TSH normal K 3.6, creatinine 1.1 Labs (10/31/2014) L k 3.9 Creatinine 1.06  Labs 07/23/2015: K 3.8 Creatinine 0.82  Labs (3/17): K 4, creatinine 0.6 Labs (5/17): K 4.1, creatinine 0.7  Review of Systems: All systems reviewed and negative except as per HPI.   Past Medical History:  Diagnosis Date  . Anxiety   . Arthritis    rheumatoid arthritis- mild, no rheumatology care   . Asthma    seasonal allergies   . Asymptomatic LV dysfunction    a. Echo in Dec 2011 with EF 35 to 40%. Felt to be due  to paced rhythm. b. EF 25-30% in 07/2014.  . Bipolar affective disorder ([place])   . Cardiac pacemaker    a. Since age 17 in 65. b. Upgrade to BiV in 2013.  [name] Carpal tunnel syndrome of right wrist   . CHF (congestive heart failure) ([place])   . Congenital complete AV block   . Depression    bipolar  . Diabetes mellitus without complication ([place])   . GERD (gastroesophageal reflux disease)   . Hypertension   . Lupus ([place])   . Lupus (systemic lupus erythematosus) ([place])   . Obesity   . Pneumonitis    a. a/w hypoxia -  inflammatory - large workup 07/2014.  . Presence of permanent cardiac pacemaker   . Seizures ([place])    as a child- from high fever  . Sinus tachycardia ([place])     Current Outpatient Prescriptions  Medication Sig Dispense Refill  . albuterol (PROVENTIL HFA;VENTOLIN HFA) 108 (90 BASE) MCG/ACT inhaler Inhale 2 puffs into the lungs every 4 (four) hours as needed for wheezing or shortness of breath. 1 each 1  . bisoprolol (ZEBETA) 5 MG tablet Take 0.5 tablets (2.5 mg total) by mouth daily. 15 tablet 6  . Blood Glucose Monitoring Suppl (ONE TOUCH ULTRA 2) w/Device KIT Check blood sugar before meals and at bedtim 1 each 0  . budesonide-formoterol (SYMBICORT) 80-4.5 MCG/ACT inhaler Inhale 2 puffs into the lungs 2 (two) times daily as needed (for shortness of breath). 1 Inhaler 6  . calcium-vitamin D (OSCAL WITH D) 500-200 MG-UNIT per tablet Take 1 tablet by mouth daily with breakfast. Reported on 04/15/2016    . camphor-menthol (SARNA) lotion Apply topically 2 (two) times daily. 222 mL 0  . etonogestrel (NEXPLANON) 68 MG IMPL implant 1 each by Subdermal route once.    . fluocinonide (LIDEX) 0.05 % external solution 1 APPLICATION APPLY ON THE SKIN AS DIRECTED APPLY TO AFFECTED AREAS DAILY ON SCALP AS NEEDED FOR ITCHING.  5  . fluticasone (FLONASE) 50 MCG/ACT nasal spray Place 2 sprays into both nostrils daily. 16 g 6  . furosemide (LASIX) 20 MG tablet Take 1 tablet (20 mg total) by mouth every other day. 30 tablet 5  . glucose blood (ONE TOUCH ULTRA TEST) test strip Check blood sugar daily before breakfast 100 each 0  . Insulin Glargine (LANTUS SOLOSTAR) 100 UNIT/ML Solostar Pen Inject 15 Units into the skin daily. 15 mL 5  . loratadine (CLARITIN) 10 MG tablet Take 1 tablet (10 mg total) by mouth daily. 30 tablet 5  . losartan (COZAAR) 25 MG tablet Take 0.5 tablets (12.5 mg total) by mouth daily. 15 tablet 6  . metFORMIN (GLUCOPHAGE) 1000 MG tablet TAKE 1 TABLET BY MOUTH TWICE A DAY WITH A MEAL 60 tablet  5  . mycophenolate (CELLCEPT) 500 MG tablet Take 750 mg by mouth 2 (two) times daily.    . pantoprazole (PROTONIX) 40 MG tablet Take 1 tablet (40 mg total) by mouth daily. 30 tablet 5  . polyethylene glycol (MIRALAX / GLYCOLAX) packet Take 17 g by mouth daily after supper. (Patient taking differently: Take 17 g by mouth daily as needed for mild constipation. ) 14 each 0  . predniSONE (DELTASONE) 20 MG tablet Take 40 mg by mouth daily with breakfast.    . senna-docusate (SENOKOT-S) 8.6-50 MG tablet Take 3 tablets by mouth 2 (two) times daily as needed for mild constipation. 100 tablet 0  . oxyCODONE-acetaminophen (PERCOCET/ROXICET) 5-325 MG tablet Take 1 tablet by mouth  every 6 (six) hours as needed for severe pain. 60 tablet 0   No current facility-administered medications for this encounter.     Allergies  Allergen Reactions  . 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 Sertraline Hcl Hives  . Tape Other (See Comments)    Burns skin      Social History   Social History  . Marital status: Single    Spouse name: N/A  . Number of children: 1  . Years of education: N/A   Occupational History  . CNA    Social History Main Topics  . Smoking status: Former Smoker    Packs/day: 0.25    Years: 0.50    Types: Cigarettes    Quit date: 07/25/1996  . Smokeless tobacco: Never Used  . Alcohol use No  . Drug use: No  . Sexual activity: Yes    Birth control/ protection: Implant, IUD     Comment: placed 04/2011   Other Topics Concern  . Not on file   Social History Narrative   Works at [employer].        Family History  Problem Relation Age of Onset  . Heart disease Mother     CHF (no details)  . Hypertension Mother   . Heart disease Father     Murmur  . Heart disease Sister 49     No details.  History of a pacemaker  . Lupus Mother     Vitals:   05/14/16 1049  BP: 94/60  Pulse: (!) 114  SpO2: 94%   Weight: 146 lb 4 oz (66.3 kg)    PHYSICAL EXAM: General:  NAD HEENT: normal Neck: supple. JVP ~6-7.  Carotids 2+ bilat; no bruits. No lymphadenopathy or thryomegaly appreciated. Cor: PMI nonpalpable. Mildly tachy, regular. No rubs, gallops or murmurs. Lungs: CTAB Abdomen: obese soft, nontender, nondistended. No hepatosplenomegaly. No bruits or masses. Good bowel sounds. Extremities: no cyanosis, clubbing, rash, no edema Neuro: alert & oriented x 3, cranial nerves grossly intact. moves all 4 extremities w/o difficulty. Affect pleasant.  ASSESSMENT & PLAN: 1) Chronic systolic HF:  Echo 04/9149 EF 20-25%, echo 1/17 improved with EF 40-45%. Nonischemic cardiomyopathy.  Medtronic CRT-P device.  Not volume overloaded on exam.  No shortness of breath but activity is limited by leg weakness. She does not seem very compliant with her meds.  Suspect volume overloaded suggested by Optivol that ended a week or so ago may have been due to not taking Lasix. - Continue Lasix at least every other day.  - Continue bisoprolol 2.5 qam and losartan 12.5 qpm.  No BP room for titration.  - Echo to reassess EF and to screen for pulmonary hypertension.  2) Anti-synthetase syndrome with interstitial lung disease: She is followed by rheumatology and is on Cellcept and prednisone.   3) Congenital AVN block s/p pacer: Has EP followup.  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   5) Sinus tachycardia: Mild, on bisoprolol.    Followup in 3 months.   Loralie Champagne 05/15/2016

## 2016-05-19 ENCOUNTER — Ambulatory Visit: Payer: Medicare Other | Admitting: Sports Medicine

## 2016-05-21 NOTE — Telephone Encounter (Signed)
Pt informed. Deseree Blount, CMA  

## 2016-05-22 ENCOUNTER — Other Ambulatory Visit: Payer: Self-pay | Admitting: Family Medicine

## 2016-05-22 DIAGNOSIS — I5032 Chronic diastolic (congestive) heart failure: Secondary | ICD-10-CM

## 2016-05-26 ENCOUNTER — Other Ambulatory Visit: Payer: Self-pay

## 2016-05-26 ENCOUNTER — Ambulatory Visit (HOSPITAL_COMMUNITY): Payer: Medicare Other | Attending: Ophthalmology

## 2016-05-26 DIAGNOSIS — I5022 Chronic systolic (congestive) heart failure: Secondary | ICD-10-CM

## 2016-05-26 DIAGNOSIS — E119 Type 2 diabetes mellitus without complications: Secondary | ICD-10-CM | POA: Diagnosis not present

## 2016-05-26 DIAGNOSIS — I371 Nonrheumatic pulmonary valve insufficiency: Secondary | ICD-10-CM | POA: Insufficient documentation

## 2016-05-26 DIAGNOSIS — E785 Hyperlipidemia, unspecified: Secondary | ICD-10-CM | POA: Insufficient documentation

## 2016-05-26 DIAGNOSIS — I071 Rheumatic tricuspid insufficiency: Secondary | ICD-10-CM | POA: Diagnosis not present

## 2016-05-26 DIAGNOSIS — I340 Nonrheumatic mitral (valve) insufficiency: Secondary | ICD-10-CM | POA: Insufficient documentation

## 2016-05-26 DIAGNOSIS — Z87891 Personal history of nicotine dependence: Secondary | ICD-10-CM | POA: Diagnosis not present

## 2016-05-26 DIAGNOSIS — I5042 Chronic combined systolic (congestive) and diastolic (congestive) heart failure: Secondary | ICD-10-CM | POA: Diagnosis not present

## 2016-05-26 DIAGNOSIS — I517 Cardiomegaly: Secondary | ICD-10-CM | POA: Insufficient documentation

## 2016-05-26 DIAGNOSIS — I5032 Chronic diastolic (congestive) heart failure: Secondary | ICD-10-CM | POA: Diagnosis not present

## 2016-05-26 DIAGNOSIS — I509 Heart failure, unspecified: Secondary | ICD-10-CM | POA: Diagnosis present

## 2016-05-28 DIAGNOSIS — Z87891 Personal history of nicotine dependence: Secondary | ICD-10-CM | POA: Diagnosis not present

## 2016-05-28 DIAGNOSIS — D8989 Other specified disorders involving the immune mechanism, not elsewhere classified: Secondary | ICD-10-CM | POA: Diagnosis not present

## 2016-05-28 DIAGNOSIS — J849 Interstitial pulmonary disease, unspecified: Secondary | ICD-10-CM | POA: Diagnosis not present

## 2016-05-28 DIAGNOSIS — R5381 Other malaise: Secondary | ICD-10-CM | POA: Diagnosis not present

## 2016-05-28 DIAGNOSIS — M3320 Polymyositis, organ involvement unspecified: Secondary | ICD-10-CM | POA: Diagnosis not present

## 2016-05-28 DIAGNOSIS — Z79899 Other long term (current) drug therapy: Secondary | ICD-10-CM | POA: Diagnosis not present

## 2016-06-04 ENCOUNTER — Ambulatory Visit (INDEPENDENT_AMBULATORY_CARE_PROVIDER_SITE_OTHER): Payer: Medicare Other | Admitting: Podiatry

## 2016-06-04 ENCOUNTER — Encounter: Payer: Self-pay | Admitting: Podiatry

## 2016-06-04 ENCOUNTER — Encounter: Payer: Self-pay | Admitting: Internal Medicine

## 2016-06-04 DIAGNOSIS — E1149 Type 2 diabetes mellitus with other diabetic neurological complication: Secondary | ICD-10-CM | POA: Diagnosis not present

## 2016-06-04 DIAGNOSIS — B351 Tinea unguium: Secondary | ICD-10-CM | POA: Diagnosis not present

## 2016-06-04 MED ORDER — NONFORMULARY OR COMPOUNDED ITEM: 2 refills | Status: DC

## 2016-06-04 NOTE — Progress Notes (Signed)
   Subjective:    Patient ID: Makayla Vasquez, female    DOB: Jun 28, 1981, 35 y.o.   MRN: [id number]  HPI  35 year old female presents the office today for concerns of her toenails peeling as well as discoloration to the toenails. Denies any pain to the mid this time. Denies any redness or drainage or any issues. She said no recent treatment for this. Denies any open sores.   Review of Systems  All other systems reviewed and are negative.      Objective:   Physical Exam General: AAO x3, NAD  Dermatological: Nails are discolored, dystrophic and brittle. They're not elongated this time. There is no tenderness the nails there is no surrounding redness or drainage or any signs of infection. There is no open sores or pre-ulcer lesions.   Vascular: Dorsalis Pedis artery and Posterior Tibial artery pedal pulses are 2/4 bilateral with immedate capillary fill time. There is no pain with calf compression, swelling, warmth, erythema.   Neruologic: Decreased sensation with Dorann Ou monofilament,.  Musculoskeletal: There is no gross deformity to bilateral lower extremities. There is decrease MMT 5/5 with a right side somewhat worse in the left. She states that overall she has weakness in her body this is not new.  Gait: Unassisted, Nonantalgic.     Assessment & Plan:  35 year old female onychodystrophy, likely onychomycosis -Treatment options discussed including all alternatives, risks, and complications -Etiology of symptoms were discussed -I discussed various treatment options. This time we'll start with a topical treatment for onychomycosis. I ordered compound cream for her. Discussed duration of treatment as well as success rate, side effects. -Follow-up in 3-4 months for diabetic foot evaluation or sooner if any issues are to arise.  Ovid Curd, DPM

## 2016-06-08 LAB — HM DIABETES EYE EXAM

## 2016-06-16 ENCOUNTER — Other Ambulatory Visit: Payer: Self-pay | Admitting: Family Medicine

## 2016-06-16 DIAGNOSIS — Z794 Long term (current) use of insulin: Secondary | ICD-10-CM | POA: Diagnosis not present

## 2016-06-16 DIAGNOSIS — E119 Type 2 diabetes mellitus without complications: Secondary | ICD-10-CM | POA: Diagnosis not present

## 2016-06-16 DIAGNOSIS — N644 Mastodynia: Secondary | ICD-10-CM

## 2016-06-16 LAB — HM DIABETES EYE EXAM

## 2016-06-18 IMAGING — CT CT ANGIO CHEST
1 of 2 series · 18 of 31 positions shown · IV contrast (OMNIPAQUE 350)
Comparison: 05/26/2014 radiographs, 05/08/14 radiograph

CLINICAL DATA: Shortness of breath, hypoxia, cough, has failed
outpatient pneumonia treatment

EXAM:
CT ANGIOGRAPHY CHEST WITH CONTRAST
TECHNIQUE: Multidetector CT imaging of the chest was performed using the
standard protocol during bolus administration of intravenous
contrast. Multiplanar CT image reconstructions and MIPs were
obtained to evaluate the vascular anatomy.
CONTRAST:  100mL OMNIPAQUE IOHEXOL 350 MG/ML SOLN

[Series 11: thins for pacs · axial · 0.74mm/px · z∈[+1193,+1368]mm · 18 of 195 slices shown]
[im 10/195  lung]
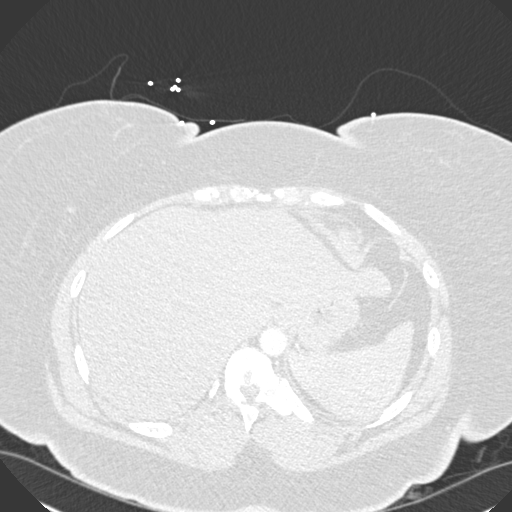
[im 20/195  mediastinal]
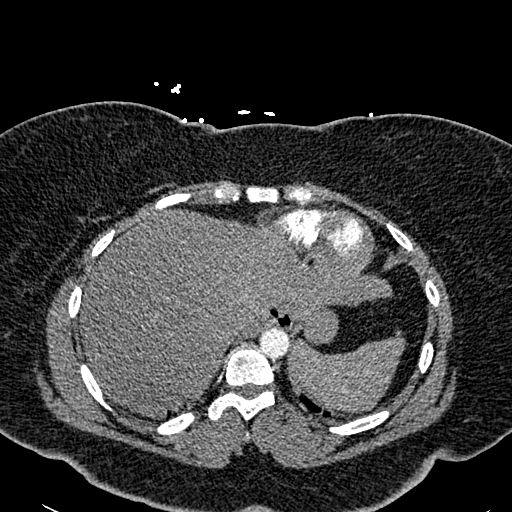
[im 30/195  lung]
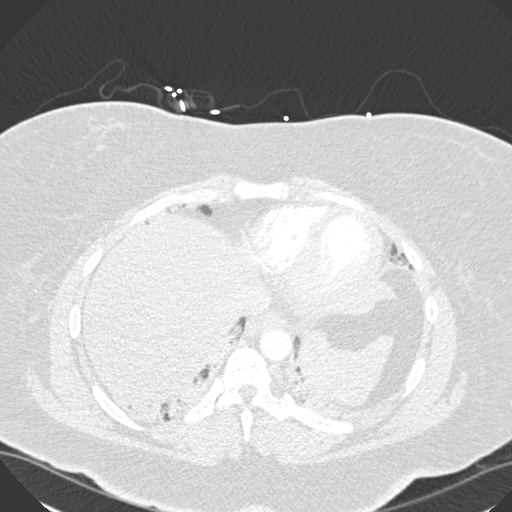
[im 49/195  mediastinal]
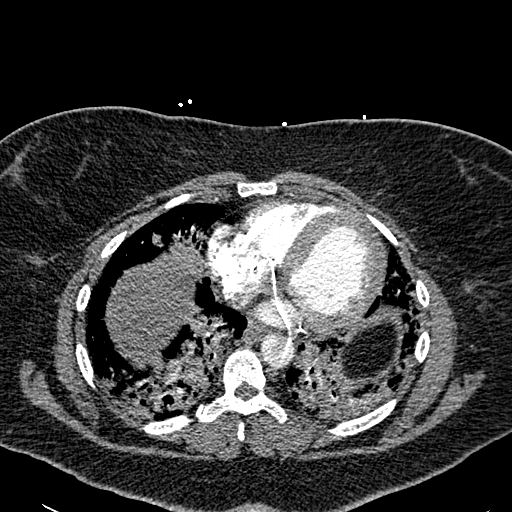
[im 59/195  lung]
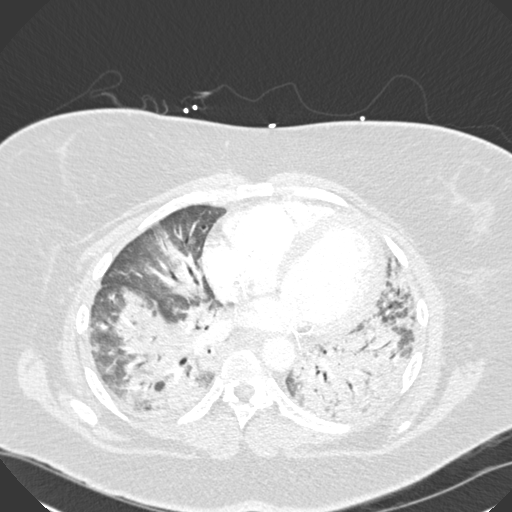
[im 65/195  mediastinal]
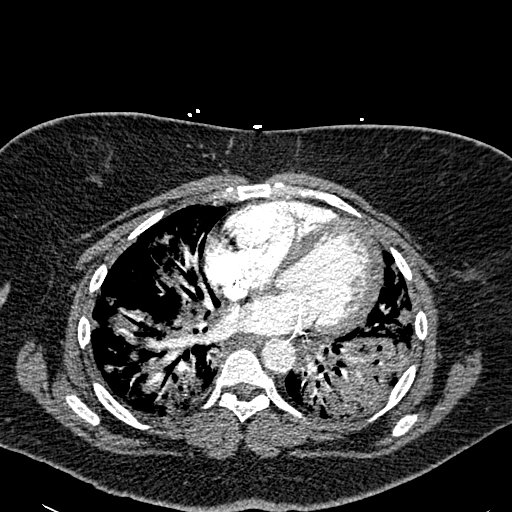
[im 68/195  lung]
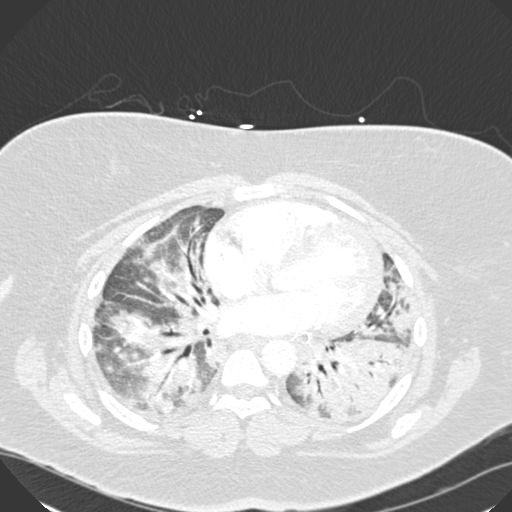
[im 78/195  mediastinal]
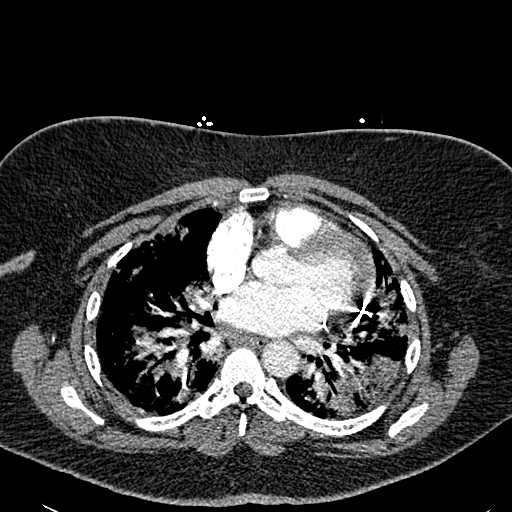
[im 88/195  lung]
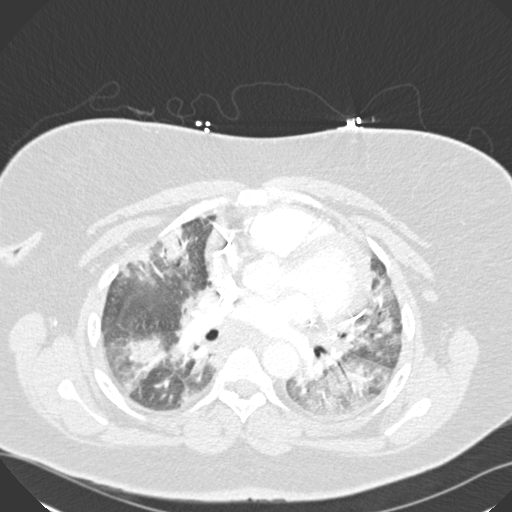
[im 107/195  mediastinal]
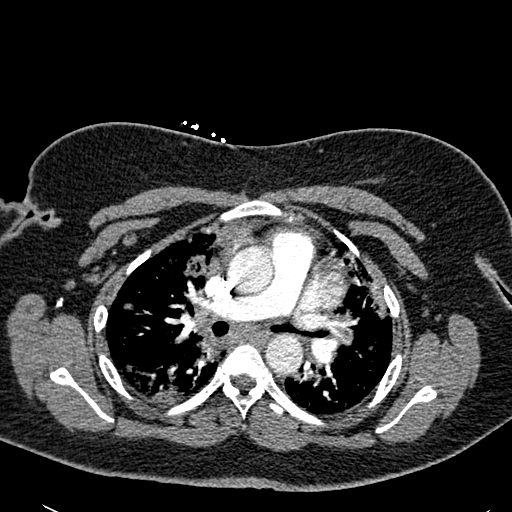
[im 117/195  lung]
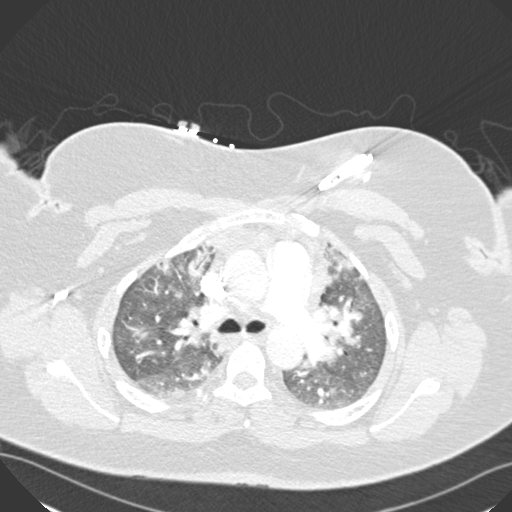
[im 127/195  mediastinal]
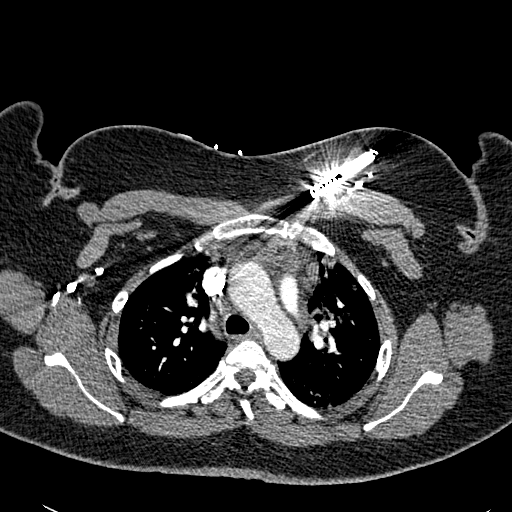
[im 130/195  lung]
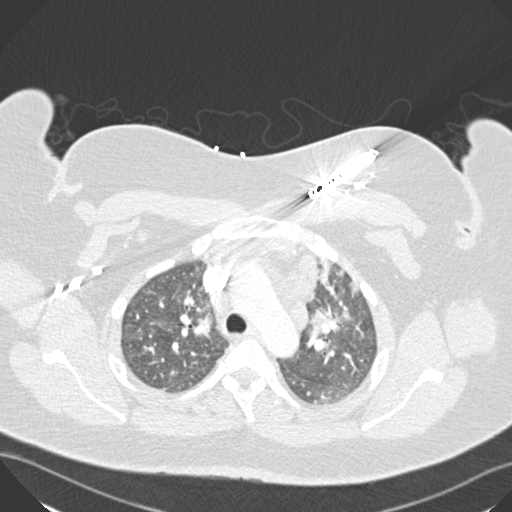
[im 136/195  mediastinal]
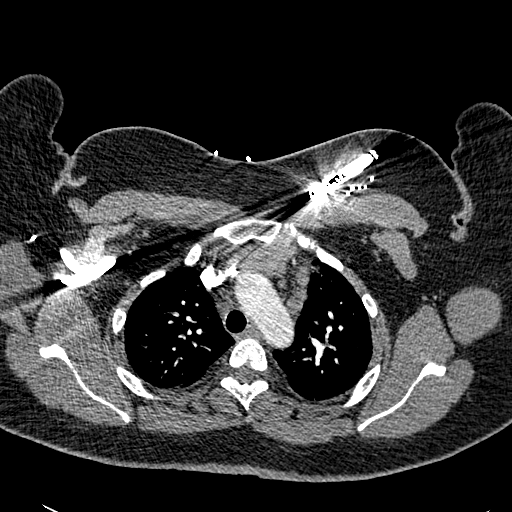
[im 146/195  lung]
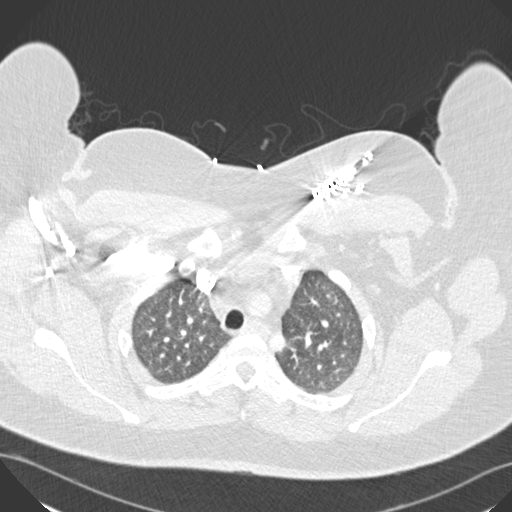
[im 165/195  mediastinal]
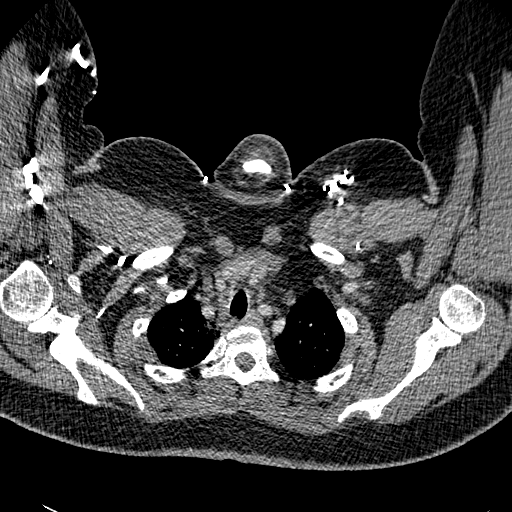
[im 175/195  lung]
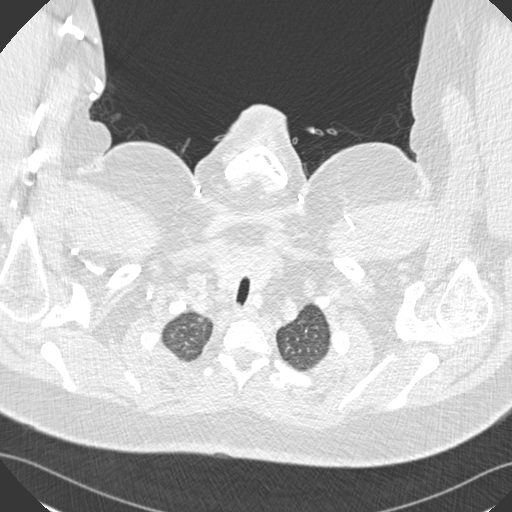
[im 185/195  mediastinal]
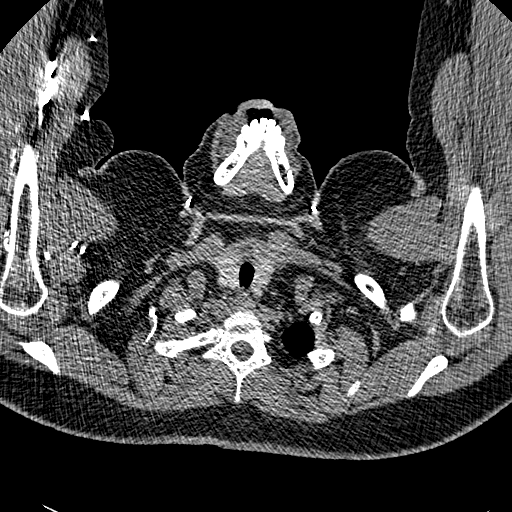

[18 of 31 positions shown; findings below may reference images not displayed]

FINDINGS: Lower lobe pulmonary arteries are somewhat obscured by extensive
bilateral lower lobe consolidation. There are no filling defects
appreciated within the pulmonary arterial system. The thoracic aorta
shows no dissection or dilatation there is a cardiac pacing device
noted. There is no discrete enlarged lymph node, although lymphoid
tissue in the subcarinal region and right hilum is mildly prominent.
There is no significant pleural effusion. There is no significant
pericardial effusion.

There are extensive bilateral pulmonary parenchymal opacities. There
is relative sparing of the lung apices. Multifocal opacity is
identified bilaterally, most severely in the lower lobes, but also
involving the right middle lobe and the mid to inferior aspects of
both upper lobes. Consolidation is most severe at the lung bases
were there are dense air bronchograms. In the upper lobes and in the
mid lung zones, there is a somewhat nodular component to the
infiltrate, with 1 area in the medial right upper lobe measuring 17
x 9 mm showing evidence of possible cavitation. This is seen on
image 30 series 12.

Images through the upper abdomen are unremarkable. There are no
acute musculoskeletal findings.

Review of the MIP images confirms the above findings.
IMPRESSION: Severe bilateral multifocal lung opacities, concerning for severe
pneumonia. There is evidence of a developing cavitary lesion in the
right upper lobe as described above.

Although lower lobe pulmonary arteries are evaluated in somewhat
limited detail due to the extensive consolidation of lung bases,
there is no evidence of filling defect within the pulmonary arterial
system appreciated to suggest pulmonary embolus.

## 2016-06-18 IMAGING — CR DG CHEST 2V
2 series · 2 of 2 positions shown · non-contrast
Comparison: 05/08/2014

CLINICAL DATA: Shortness of breath and hypoxia.

EXAM:
CHEST  2 VIEW

[w chest pa]
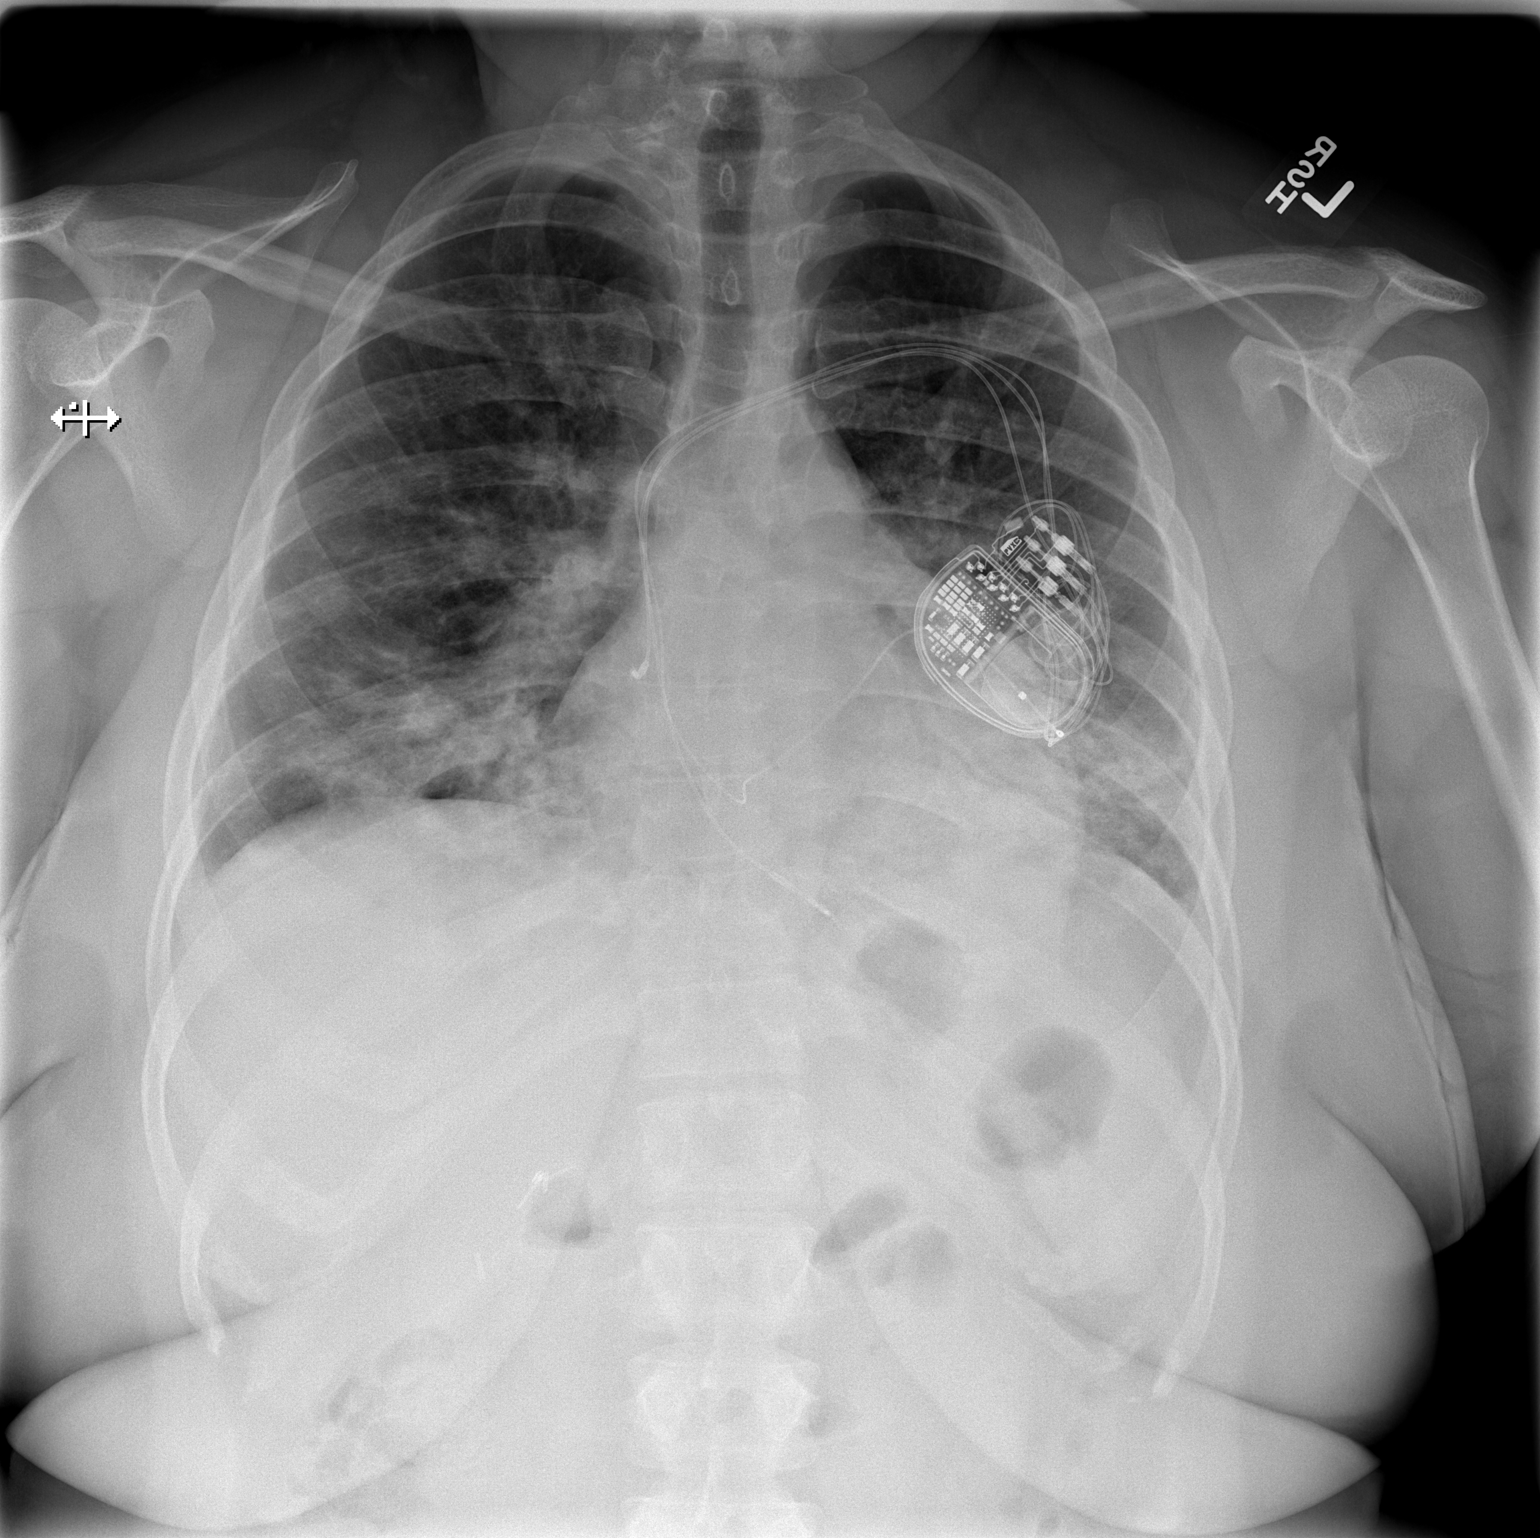

[w chest lat]
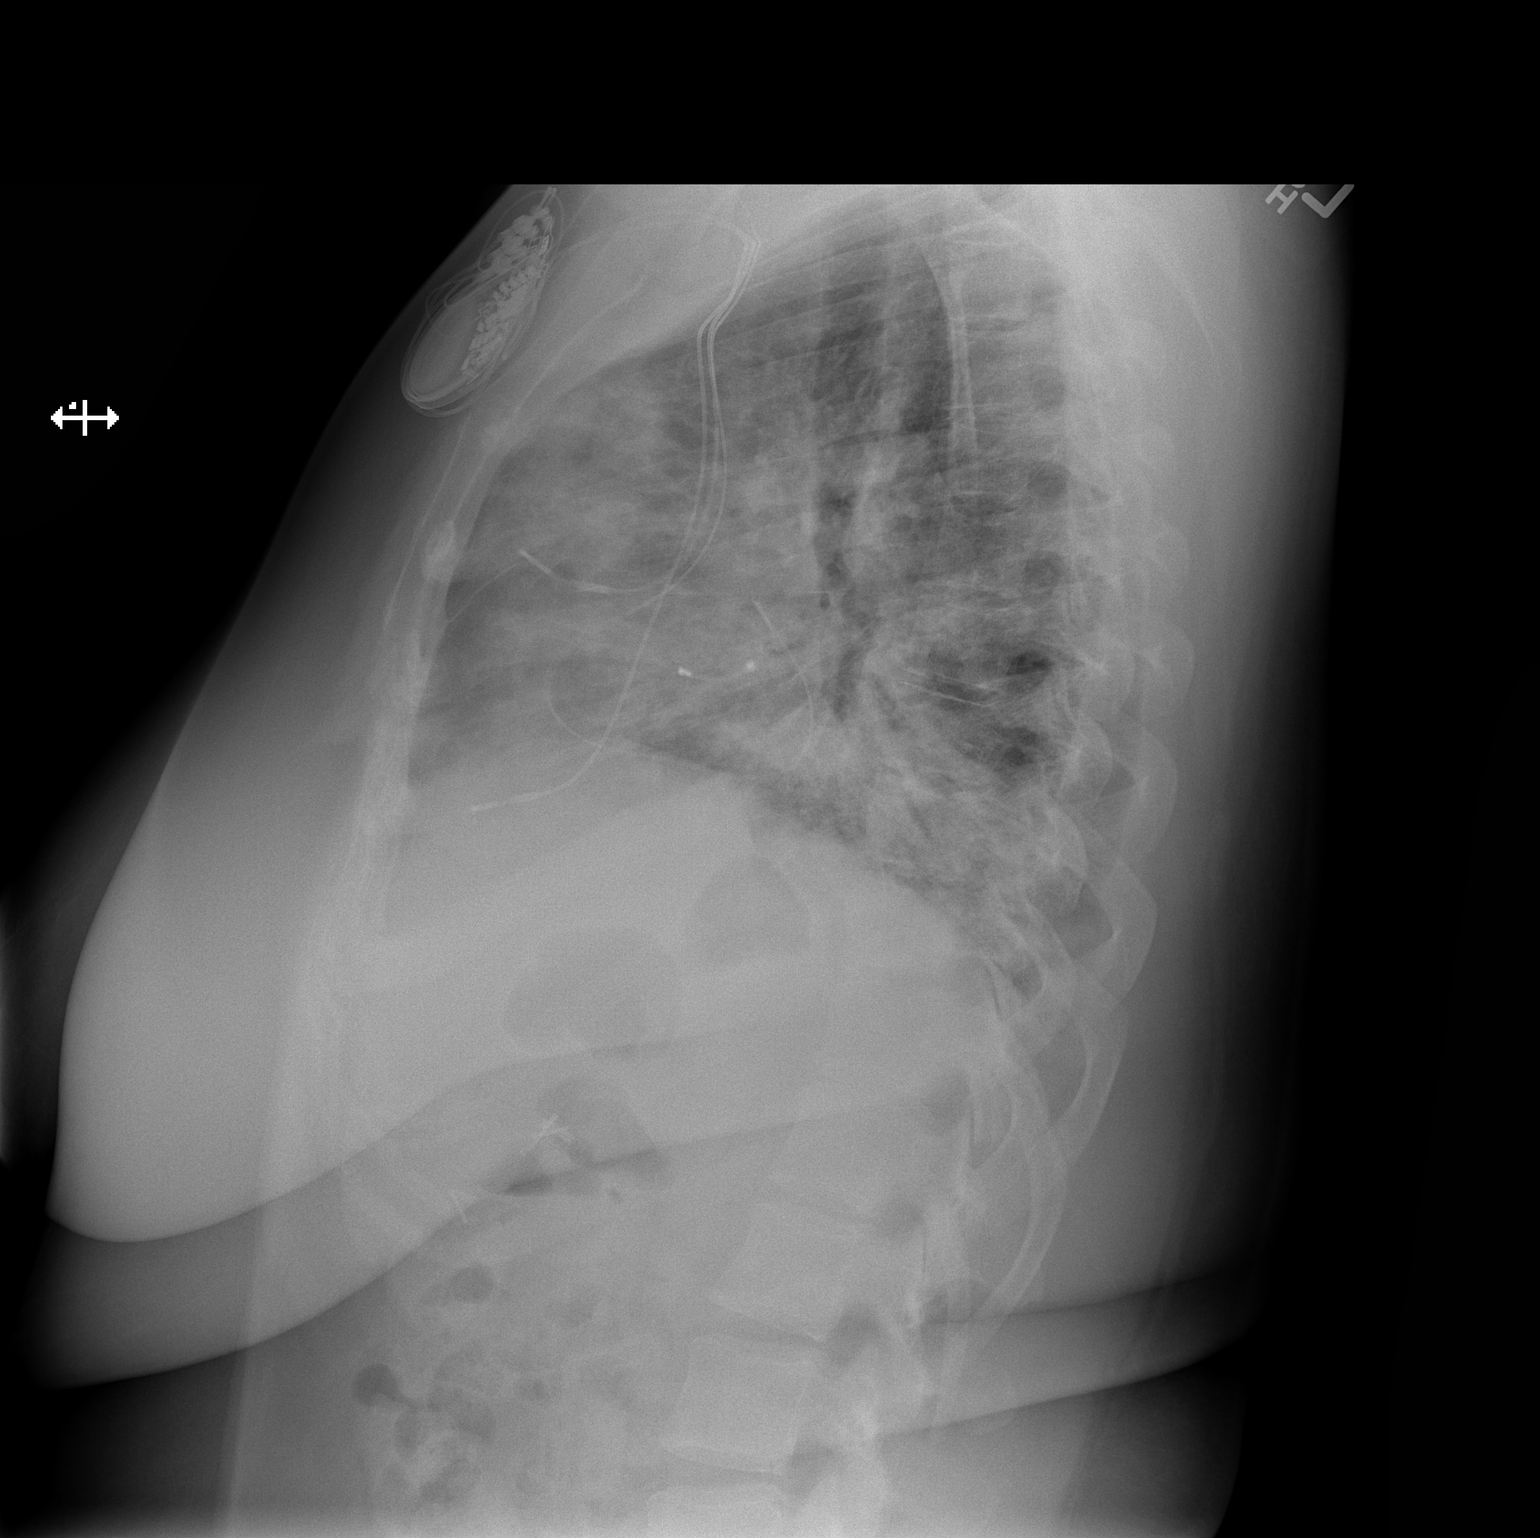

[2 of 2 positions shown; findings below may reference images not displayed]

FINDINGS: Left-sided 3 lead pacemaker remains in place. Cardiac silhouette
remains mildly enlarged. Patchy opacities in the mid and lower lungs
bilaterally do not appear significantly changed. No pleural effusion
or pneumothorax is identified. Right upper quadrant abdominal
surgical clips are noted. No acute osseous abnormality is seen.
IMPRESSION: Unchanged bilateral lung opacities consistent with pneumonia.

## 2016-06-20 ENCOUNTER — Other Ambulatory Visit: Payer: Self-pay

## 2016-06-20 ENCOUNTER — Other Ambulatory Visit: Payer: Self-pay | Admitting: Internal Medicine

## 2016-06-20 ENCOUNTER — Other Ambulatory Visit: Payer: Self-pay | Admitting: Family Medicine

## 2016-06-20 DIAGNOSIS — N644 Mastodynia: Secondary | ICD-10-CM

## 2016-06-21 IMAGING — CR DG CHEST 1V PORT
1 series · 1 of 1 positions shown · non-contrast
Comparison: May 26, 2014 chest radiograph and chest CT

CLINICAL DATA: Congestion

EXAM:
PORTABLE CHEST - 1 VIEW

[portable]
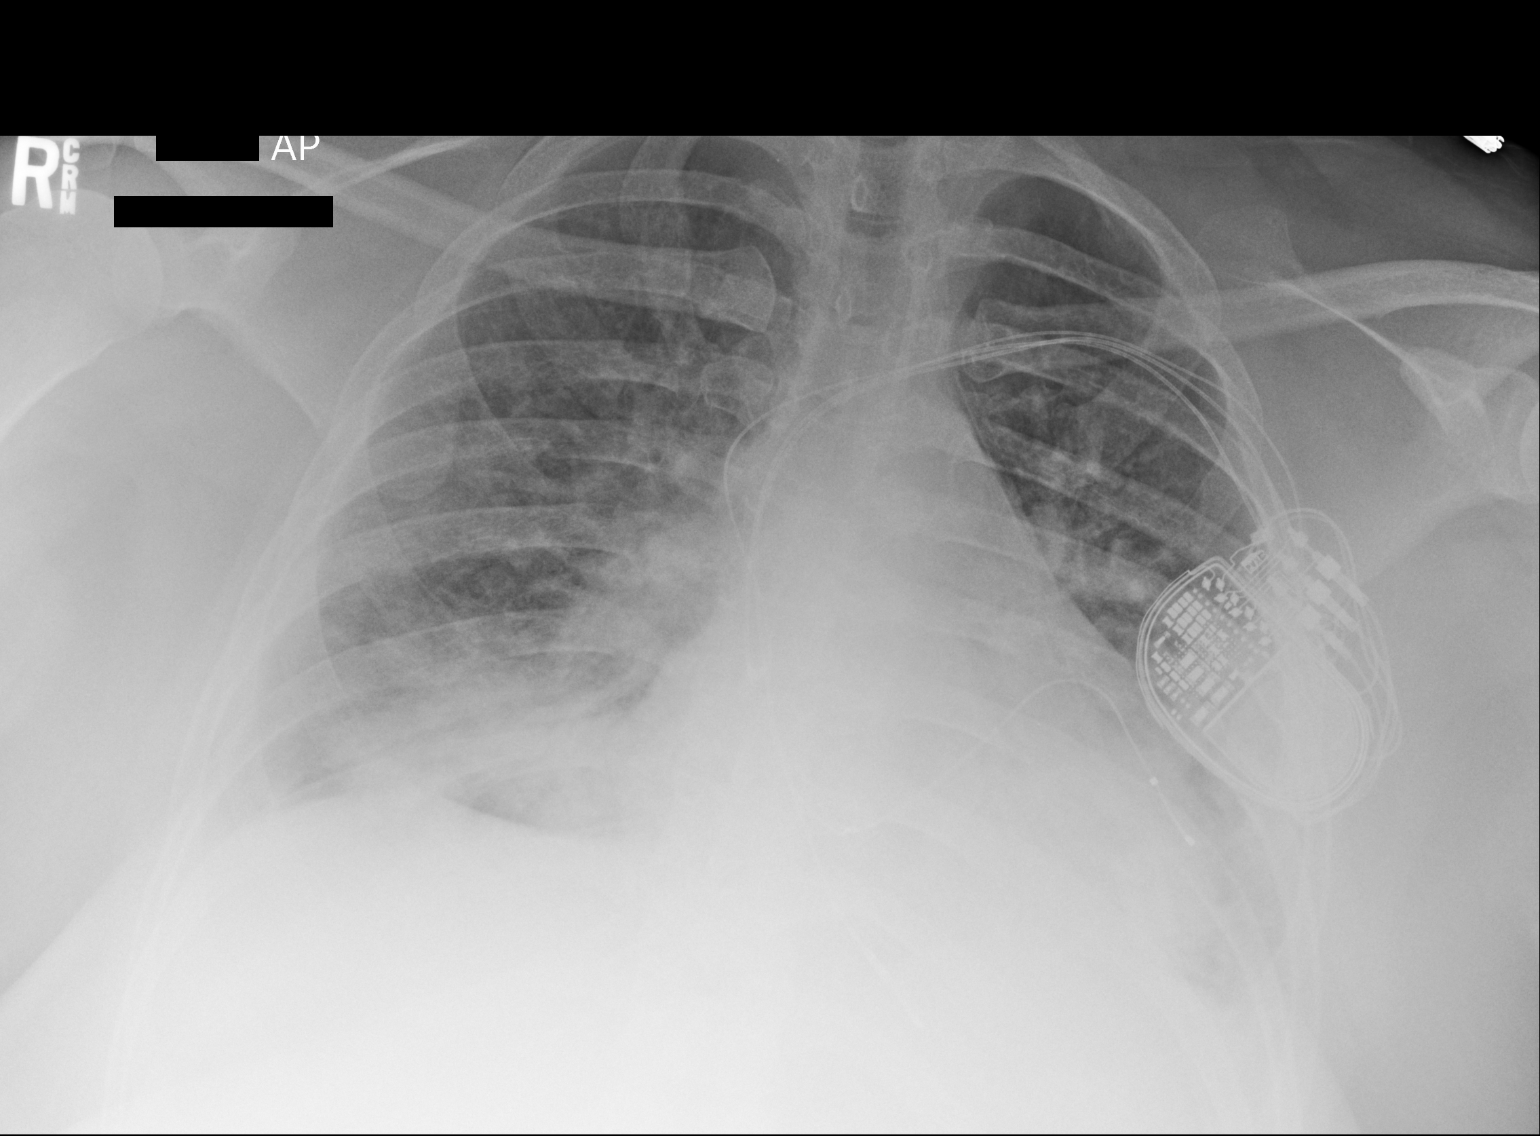

[1 of 1 positions shown; findings below may reference images not displayed]

FINDINGS: There is increase consolidation in both lung bases. There is more
subtle patchy infiltrate in both upper lobes, better seen on CT.
Heart is enlarged with pulmonary vascularity normal. No adenopathy.
Pacemaker leads are attached to the left and right ventricle.
IMPRESSION: Increase in bibasilar consolidation. More subtle patchy infiltrate
in the upper lobes is better seen on recent CT. Stable cardiomegaly.

## 2016-06-24 IMAGING — CR DG CHEST 1V PORT
1 series · 1 of 1 positions shown · non-contrast
Comparison: Portable chest x-ray May 31, 2014

CLINICAL DATA: Pneumonia

EXAM:
PORTABLE CHEST - 1 VIEW

[AP]
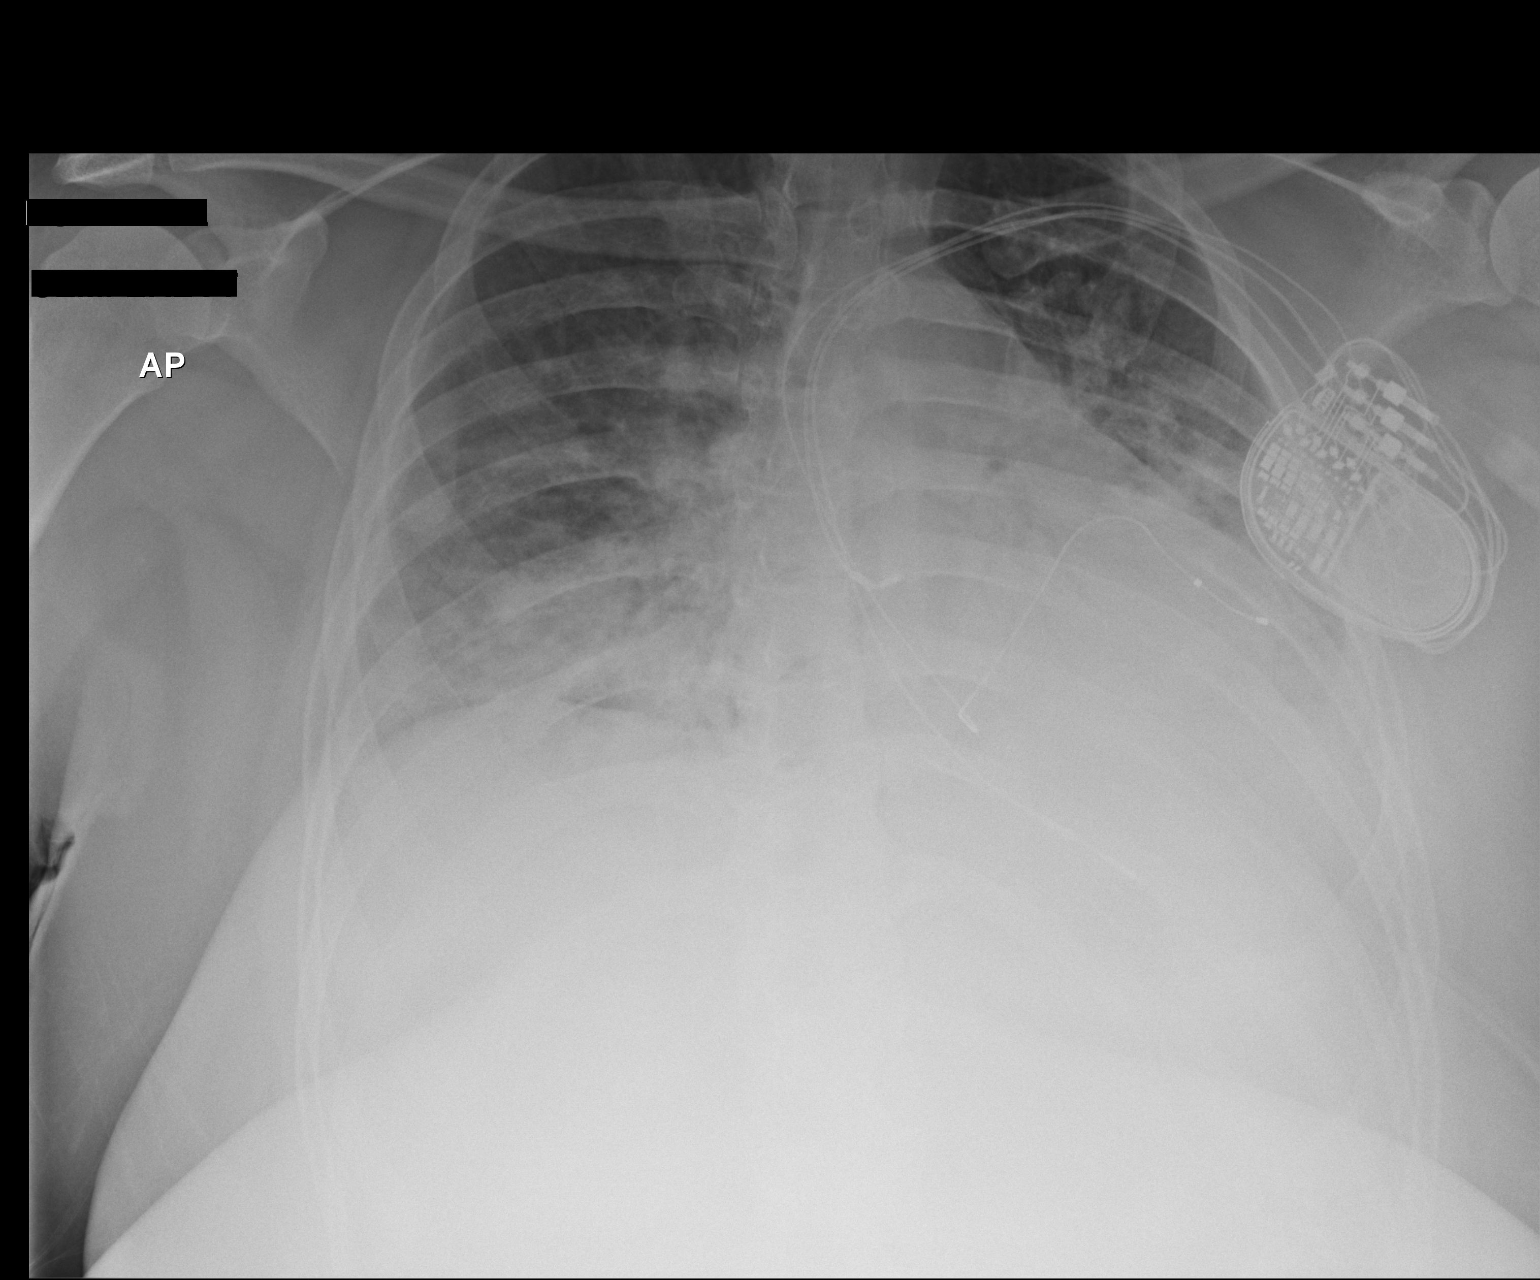

[1 of 1 positions shown; findings below may reference images not displayed]

FINDINGS: The lungs are adequately inflated. There remain patchy interstitial
and alveolar densities bilaterally. The left hemidiaphragm is nearly
totally obscured and there is increased obscuration of the right
hemidiaphragm. The cardiac silhouette remains enlarged. The
pulmonary vascularity is indistinct. The permanent pacemaker is
unchanged in position. The bony structures are unremarkable.
IMPRESSION: Patchy interstitial and alveolar filling processes are consistent
with clinical pneumonia. An element of CHF is not excluded. There
has been slight deterioration since yesterday's study. A followup PA
and lateral chest x-ray would be useful when the patient can
tolerate the procedure.

## 2016-06-25 ENCOUNTER — Other Ambulatory Visit: Payer: Medicare Other

## 2016-06-27 DIAGNOSIS — Z79899 Other long term (current) drug therapy: Secondary | ICD-10-CM | POA: Diagnosis not present

## 2016-06-27 DIAGNOSIS — M3322 Polymyositis with myopathy: Secondary | ICD-10-CM | POA: Diagnosis not present

## 2016-06-27 DIAGNOSIS — M358 Other specified systemic involvement of connective tissue: Secondary | ICD-10-CM | POA: Diagnosis not present

## 2016-07-08 DIAGNOSIS — M3322 Polymyositis with myopathy: Secondary | ICD-10-CM | POA: Diagnosis not present

## 2016-07-08 DIAGNOSIS — M358 Other specified systemic involvement of connective tissue: Secondary | ICD-10-CM | POA: Diagnosis not present

## 2016-07-08 DIAGNOSIS — Z79899 Other long term (current) drug therapy: Secondary | ICD-10-CM | POA: Diagnosis not present

## 2016-07-09 NOTE — Progress Notes (Signed)
   [facility] Phone: [phone]   Date of Visit: 07/10/2016   HPI:  Makayla Vasquez is a 35 y.o. female presenting to clinic today for same day appointment. PCP: Tarri Abernethy, MD Concerns today include:  - sore throat, rhinorrhea, cough with clear think with green sometimes for almost a week - no fevers, some chills intermittently  - reports always has muscle aches; not really associated with the new symptoms - symptoms are stable and not worsening - has not tried anything for symptoms  - does have some sinus pressure  - no ear pain  - staying hydrated - no dysphagia - everyone in the house has a cough - no flu shot this season    - no difficulty breathing   ROS: See HPI.  PMFSH:  Asthma ILD/ Chronic Respiratory Failure with Hypoxia Congenital complete AV Block with pacemaker HFrEF Adrenal Insufficiency  PHYSICAL EXAM: BP 116/79   Pulse 80   Temp 98.2 F (36.8 C) (Oral)   Wt 151 lb (68.5 kg)   SpO2 100%   BMI 25.13 kg/m  GEN: NAD, non-toxic appearing  HEENT: no cervical lymphadenopathy, OP without erythema, swelling or exudates. TMs wnl bilaterally. Mild maxillary sinus tenderness to percussion bilaterally.   CV: RRR, no murmurs, rubs, or gallops PULM: CTAB, normal effort SKIN: No rash or cyanosis; warm and well-perfused EXTR: No lower extremity edema or calf tenderness PSYCH: Mood and affect euthymic, normal rate and volume of speech NEURO: Awake, alert, no focal deficits grossly, normal speech  ASSESSMENT/PLAN:  Viral URI: Symptoms consistent with viral URI.  - symptomatic treatment  - strict return precautions discussed.    Palma Holter, MD PGY 2 [facility]

## 2016-07-10 ENCOUNTER — Ambulatory Visit (INDEPENDENT_AMBULATORY_CARE_PROVIDER_SITE_OTHER): Payer: Medicare Other | Admitting: Internal Medicine

## 2016-07-10 ENCOUNTER — Encounter: Payer: Self-pay | Admitting: Internal Medicine

## 2016-07-10 VITALS — BP 116/79 | HR 80 | Temp 98.2°F | Wt 151.0 lb

## 2016-07-10 DIAGNOSIS — B349 Viral infection, unspecified: Secondary | ICD-10-CM

## 2016-07-10 NOTE — Patient Instructions (Signed)
I think you have a viral upper respiratory infection. This will slowly resolve over the next week or two. Please return to clinic if you symptoms worsen suddenly.

## 2016-07-20 IMAGING — CR DG CHEST 2V
2 series · 2 of 2 positions shown · non-contrast
Comparison: Plain film of the chest 06/02/2014. CT chest
06/16/2014.

CLINICAL DATA: Diabetic patient.  Complete AV block.

EXAM:
CHEST  2 VIEW

[view not recorded (1 of 2)]
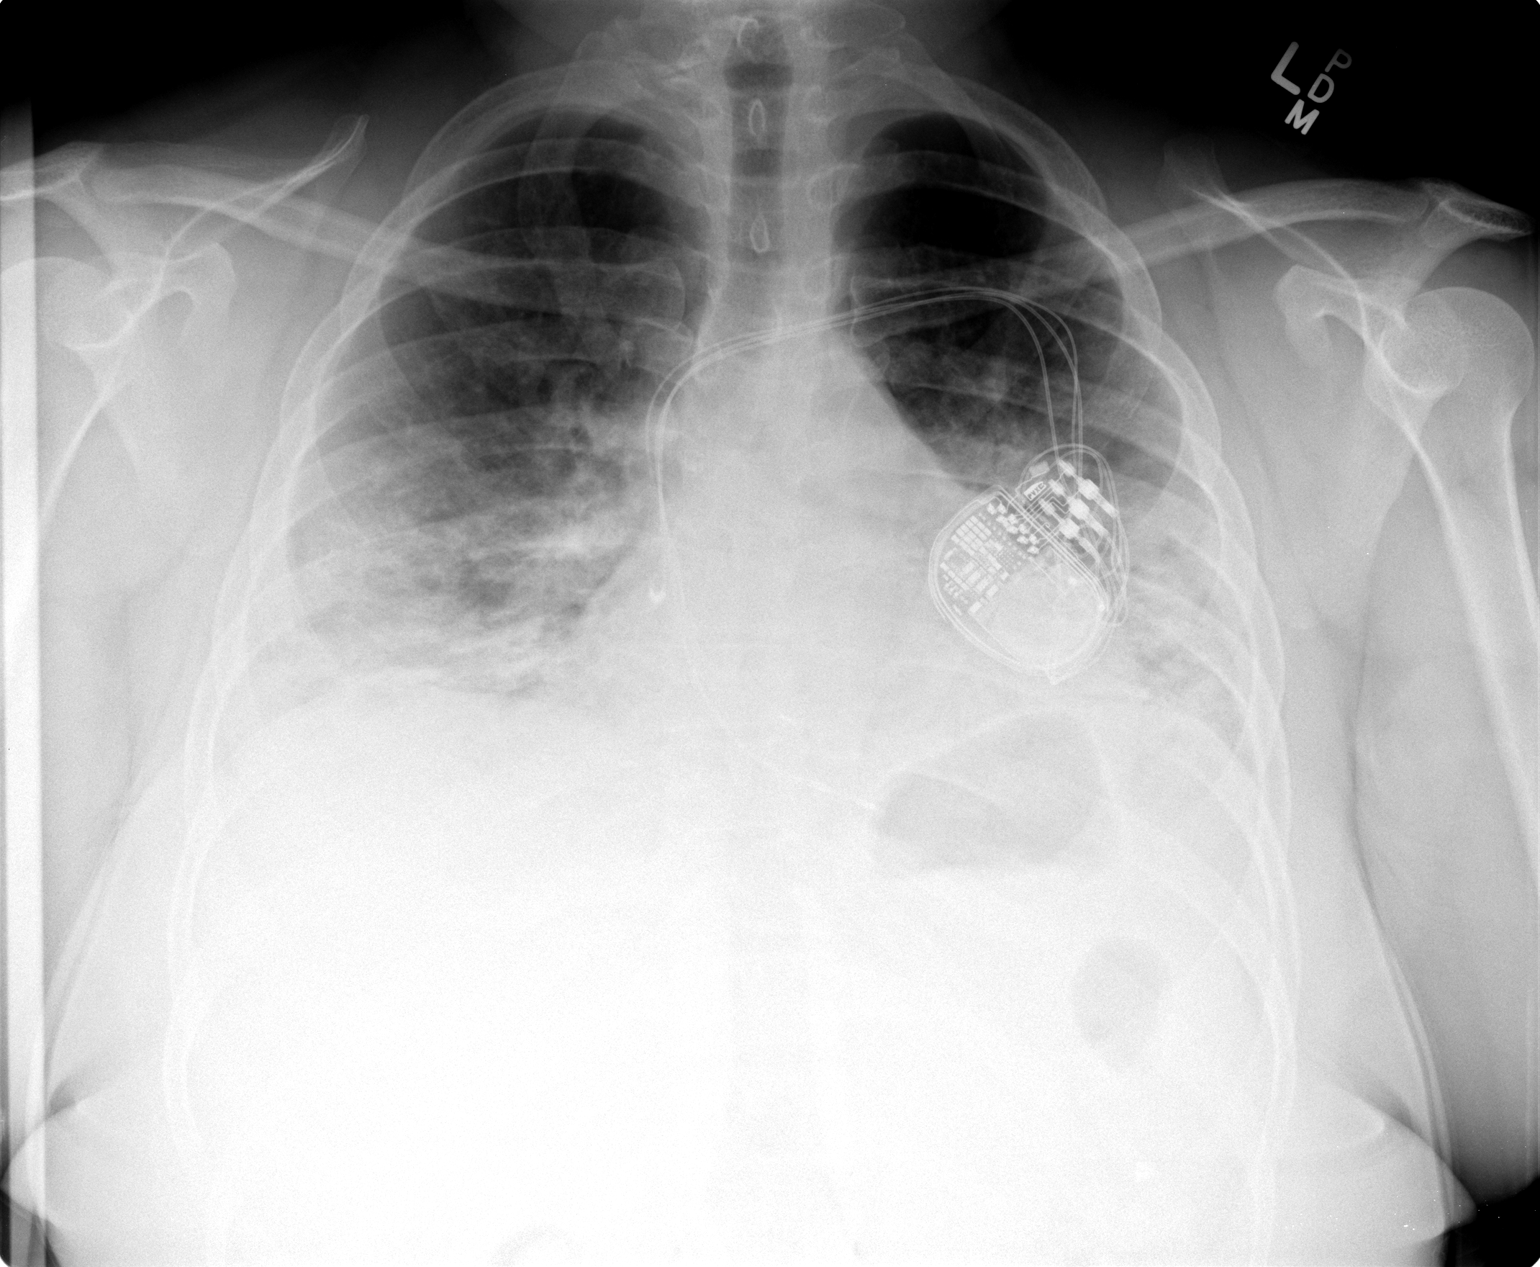

[view not recorded (2 of 2)]
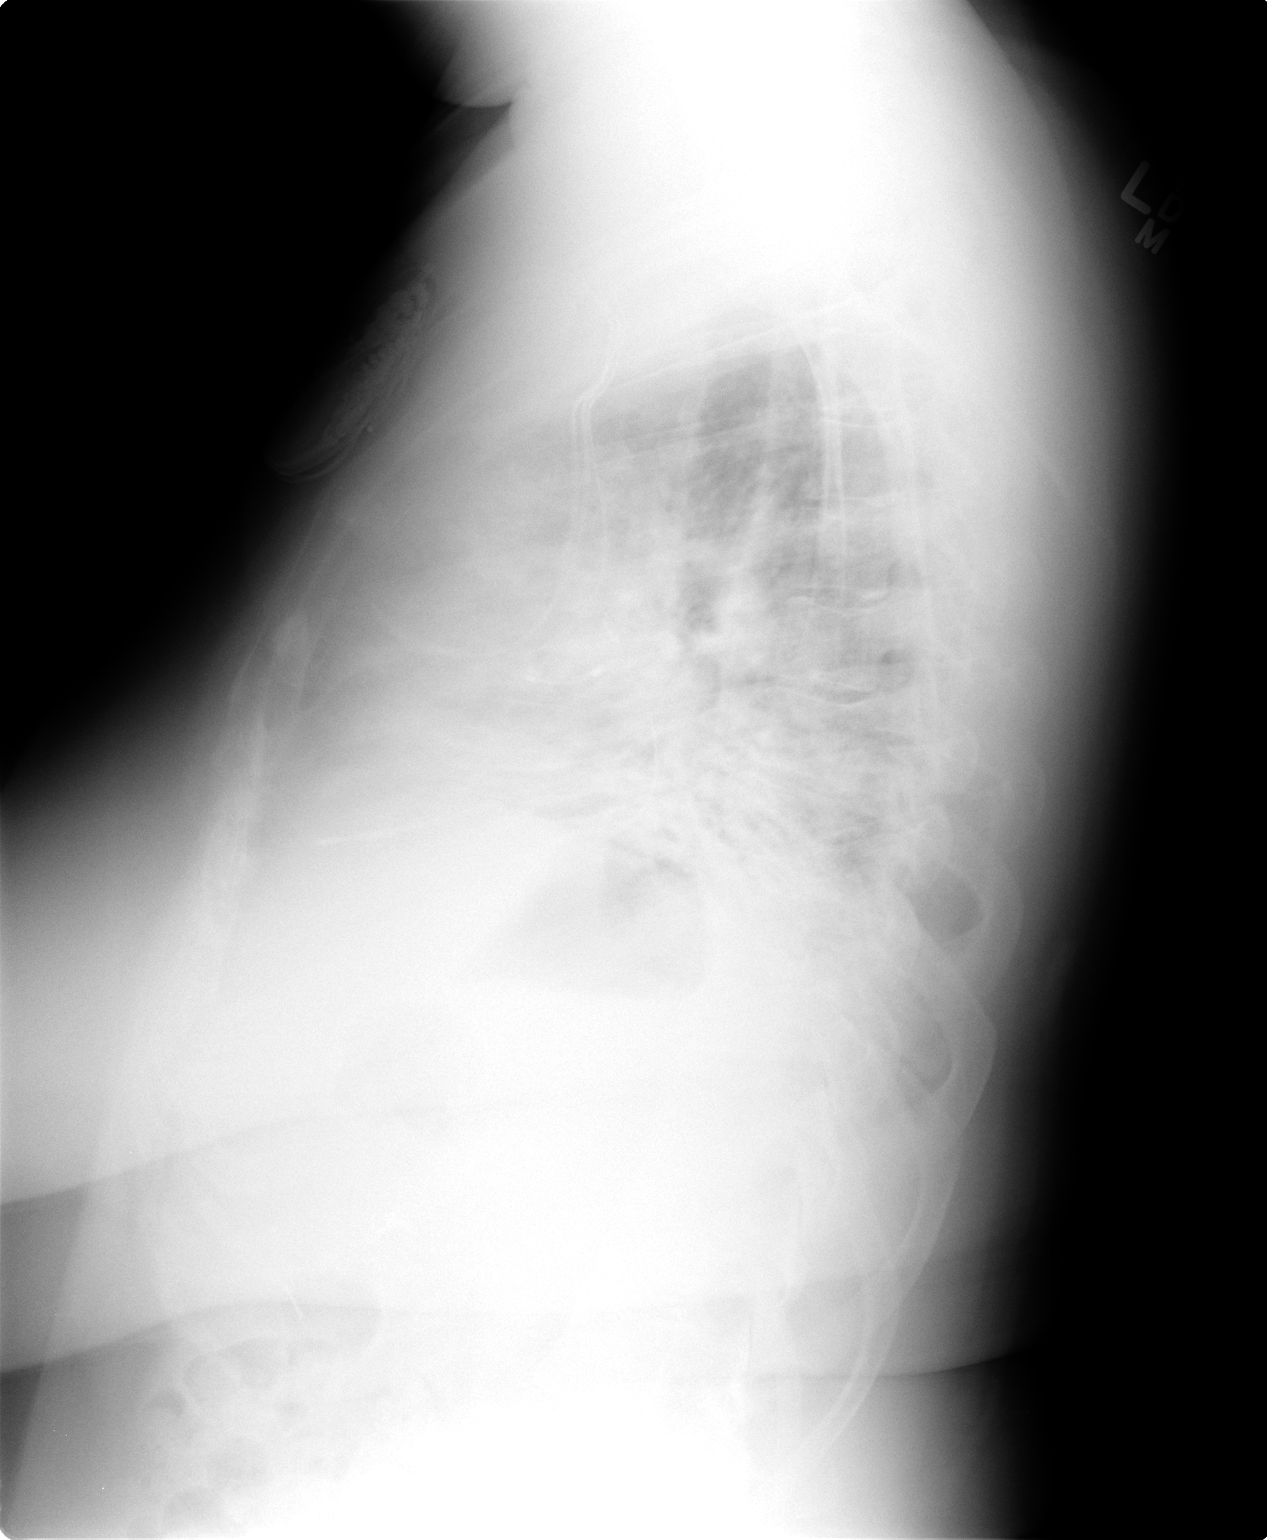

[2 of 2 positions shown; findings below may reference images not displayed]

FINDINGS: Extensive bilateral airspace disease seen on the prior exams
persists. Aeration appears worsened since the prior plain film. Lung
volumes are low. Heart size is enlarged. Pacing device in place.
There is no pneumothorax or pleural effusion.
IMPRESSION: Extensive bilateral airspace disease appears worsened since the most
recent plain films compatible with persistent pulmonary edema and/or
pneumonia.

## 2016-07-22 ENCOUNTER — Ambulatory Visit
Admission: RE | Admit: 2016-07-22 | Discharge: 2016-07-22 | Disposition: A | Discharge status: Home / Self Care | Payer: Medicare Other | Source: Ambulatory Visit | Attending: Family Medicine | Admitting: Family Medicine

## 2016-07-22 DIAGNOSIS — N644 Mastodynia: Secondary | ICD-10-CM

## 2016-07-24 ENCOUNTER — Ambulatory Visit (INDEPENDENT_AMBULATORY_CARE_PROVIDER_SITE_OTHER): Payer: Medicare Other | Admitting: Family Medicine

## 2016-07-24 ENCOUNTER — Encounter: Payer: Self-pay | Admitting: Family Medicine

## 2016-07-24 VITALS — BP 131/78 | HR 107 | Temp 97.5°F | Wt 142.0 lb

## 2016-07-24 DIAGNOSIS — J069 Acute upper respiratory infection, unspecified: Secondary | ICD-10-CM | POA: Diagnosis present

## 2016-07-24 DIAGNOSIS — B9789 Other viral agents as the cause of diseases classified elsewhere: Secondary | ICD-10-CM

## 2016-07-24 MED ORDER — OXYMETAZOLINE HCL 0.05 % NA SOLN: 1.0000 | Freq: Two times a day (BID) | NASAL | 0 refills | Status: DC

## 2016-07-24 MED ORDER — FLUTICASONE PROPIONATE 50 MCG/ACT NA SUSP: 2.0000 | Freq: Every day | NASAL | 6 refills | Status: DC

## 2016-07-24 NOTE — Patient Instructions (Signed)
You have a cold.   Start the afrin nasal spray today and use for 3 days. Do not use this for more than 3 days.  Start the flonase today. You can use this long term.  You can also try the broncolin for the cough.  Let us know if you arent getting better.  Take care,  Dr Jimmey Ralph

## 2016-07-24 NOTE — Progress Notes (Signed)
    Subjective:  Makayla Vasquez is a 35 y.o. female who presents to the [facility] today with a chief complaint of cough.   HPI:  Cough Symptoms started 2 weeks ago. Associated with runny nose. Cough is sometimes productive of green phlegm. Some facial pain. Some sore throat. Some ear pain. Several sick contacts at home. She has a history of interstitial lung disease and is currently on chronic prednisone and cellcept. She reports that she was taken off the albuterol and symbicort. Denies any fevers or shortness of breath. Is at her baseline oxygen usage.   ROS: Per HPI  PMH: Smoking history reviewed.    Objective:  Physical Exam: BP 131/78   Pulse (!) 107   Temp 97.5 F (36.4 C) (Oral)   Wt 142 lb (64.4 kg)   SpO2 97% Comment: on 3L of oxygen  BMI 23.63 kg/m   Gen: NAD, resting comfortably, [place] in place. Speaking in full sentences.  HEENT: Right TM with clear effusion. Left TM clear. Nasal turbinates boggy and erythematous bilaterally with clear discharge. OP clear.  CV: Mildly tachycardic. Regular rhythm with no murmurs appreciated Pulm: [place] in place. NWOB, Diminished breath sounds but CTAB with no crackles, wheezes, or rhonchi MSK: no edema, cyanosis, or clubbing noted Skin: warm, dry Neuro: grossly normal, moves all extremities Psych: Normal affect and thought content  Assessment/Plan:  Viral URI with Cough No signs of bacterial infection or pneumonia. Patient has a history of underlying ILD, however her respiratory status is stable on her home amount of O2 and she has no worrying signs or symptoms. Will treat symptomatically with flonase and a 3 day course of afrin nasal spray. Return precautions reviewed. Follow up as needed.   Katina Degree. Jimmey Ralph, MD [facility] Resident PGY-3 07/24/2016 11:03 AM

## 2016-07-29 IMAGING — CR DG CHEST 2V
2 series · 2 of 2 positions shown · non-contrast
Comparison: 06/27/2014

CLINICAL DATA: Low oxygen levels

EXAM:
CHEST  2 VIEW

[view not recorded (1 of 2)]
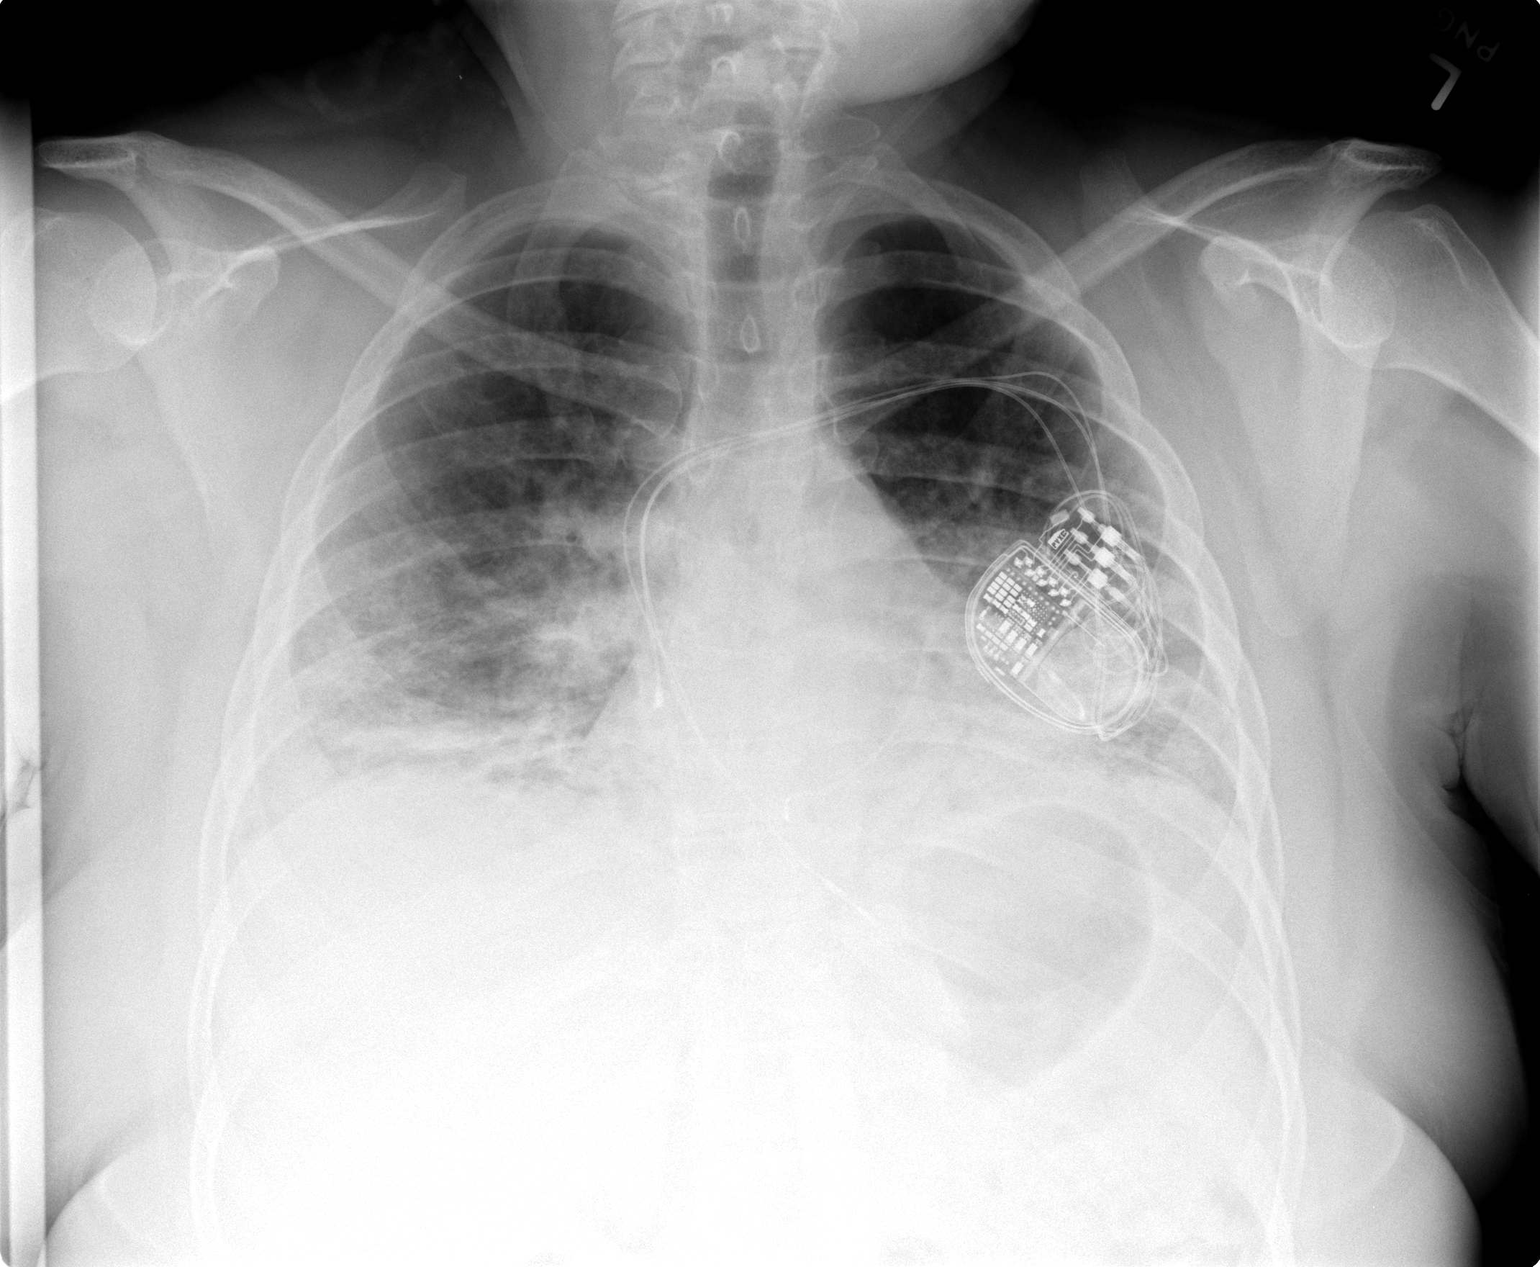

[view not recorded (2 of 2)]
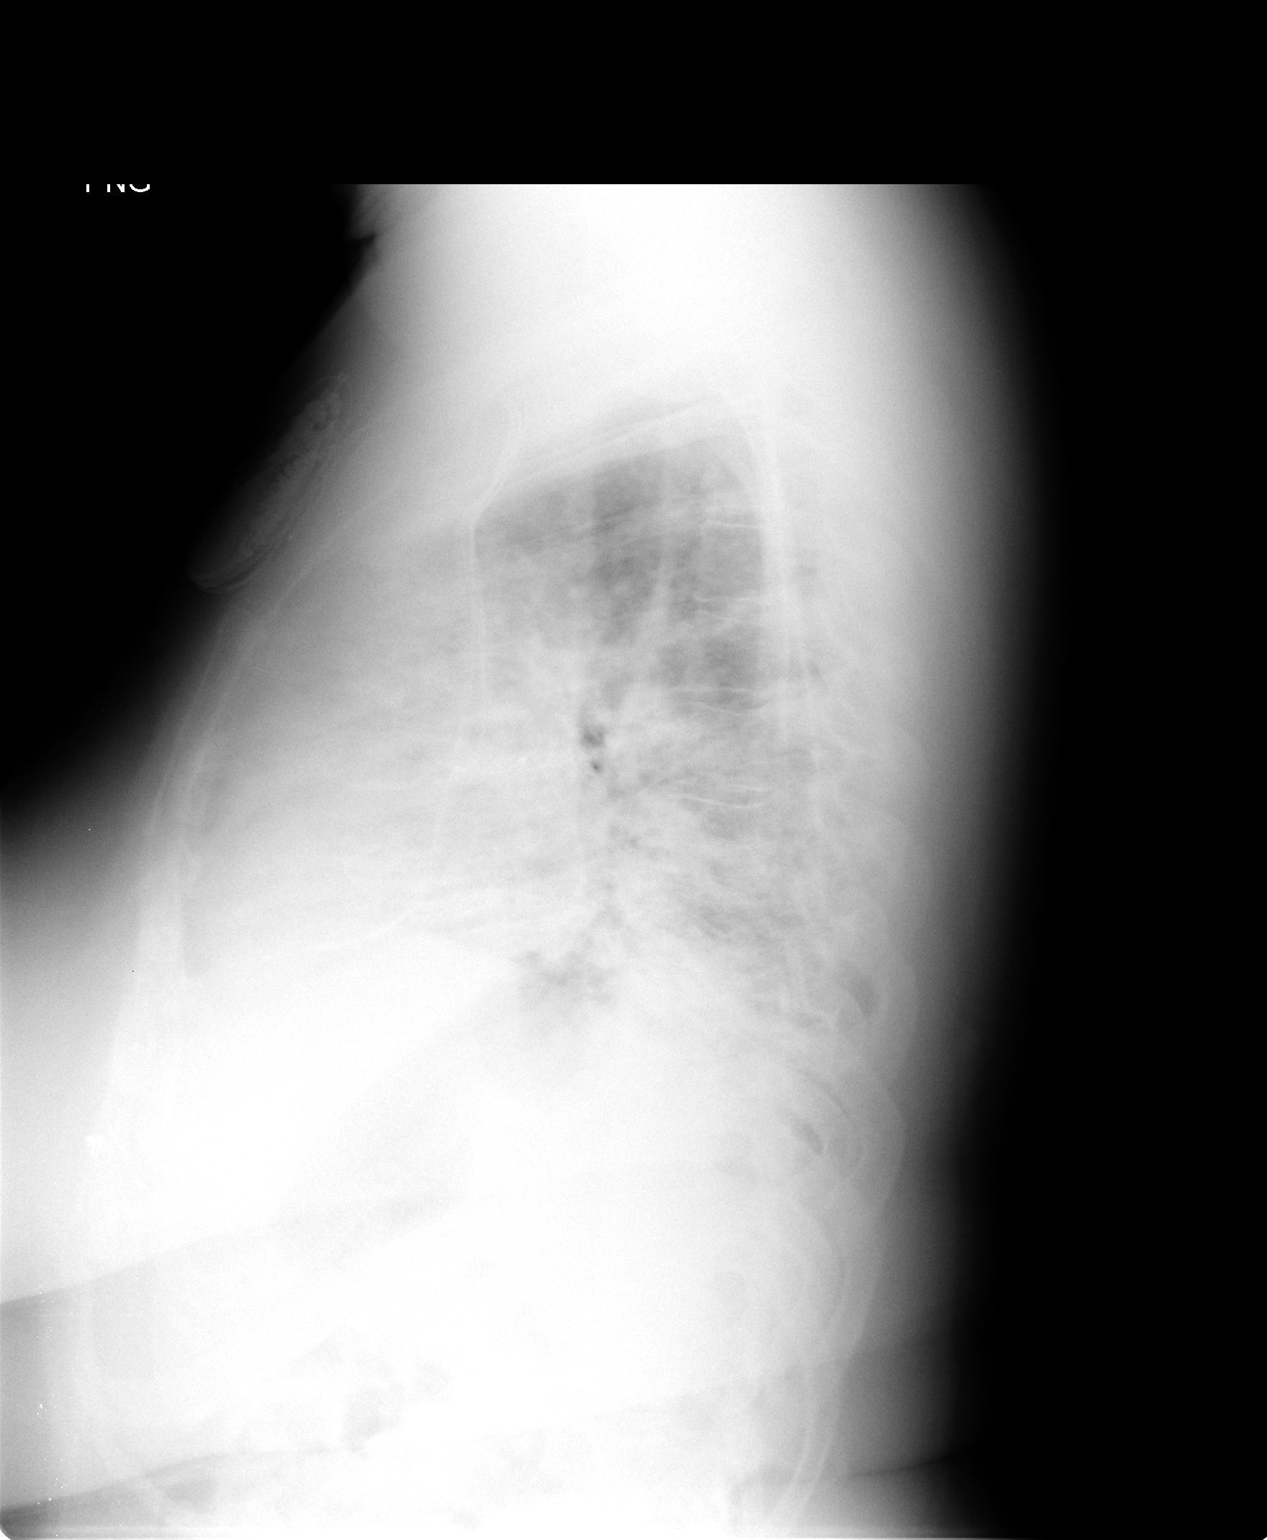

[2 of 2 positions shown; findings below may reference images not displayed]

FINDINGS: There is a left chest wall ICD with leads in the right atrial
appendage and right ventricle. The heart size appears normal.
Atelectasis is noted in both lung bases. Bilateral airspace
opacities appear lower lobe predominant and appear are unchanged
from previous exam.
IMPRESSION: 1. No change in aeration to the lungs compared with the previous
exam.

## 2016-08-08 DIAGNOSIS — Z794 Long term (current) use of insulin: Secondary | ICD-10-CM | POA: Diagnosis not present

## 2016-08-08 DIAGNOSIS — M609 Myositis, unspecified: Secondary | ICD-10-CM | POA: Diagnosis not present

## 2016-08-08 DIAGNOSIS — Z7951 Long term (current) use of inhaled steroids: Secondary | ICD-10-CM | POA: Diagnosis not present

## 2016-08-08 DIAGNOSIS — J849 Interstitial pulmonary disease, unspecified: Secondary | ICD-10-CM | POA: Diagnosis not present

## 2016-08-08 DIAGNOSIS — Z87891 Personal history of nicotine dependence: Secondary | ICD-10-CM | POA: Diagnosis not present

## 2016-08-08 DIAGNOSIS — Z79899 Other long term (current) drug therapy: Secondary | ICD-10-CM | POA: Diagnosis not present

## 2016-08-08 DIAGNOSIS — D8989 Other specified disorders involving the immune mechanism, not elsewhere classified: Secondary | ICD-10-CM | POA: Diagnosis not present

## 2016-08-08 DIAGNOSIS — M6281 Muscle weakness (generalized): Secondary | ICD-10-CM | POA: Diagnosis not present

## 2016-08-15 ENCOUNTER — Encounter: Payer: Self-pay | Admitting: Obstetrics & Gynecology

## 2016-08-15 ENCOUNTER — Ambulatory Visit (INDEPENDENT_AMBULATORY_CARE_PROVIDER_SITE_OTHER): Payer: Medicare Other | Admitting: Obstetrics & Gynecology

## 2016-08-15 VITALS — BP 114/82 | HR 113 | Wt 148.7 lb

## 2016-08-15 DIAGNOSIS — Z3046 Encounter for surveillance of implantable subdermal contraceptive: Secondary | ICD-10-CM | POA: Diagnosis not present

## 2016-08-15 DIAGNOSIS — Z3202 Encounter for pregnancy test, result negative: Secondary | ICD-10-CM | POA: Diagnosis not present

## 2016-08-15 DIAGNOSIS — Z30017 Encounter for initial prescription of implantable subdermal contraceptive: Secondary | ICD-10-CM

## 2016-08-15 LAB — POCT PREGNANCY, URINE: PREG TEST UR: NEGATIVE

## 2016-08-15 MED ORDER — ETONOGESTREL 68 MG ~~LOC~~ IMPL
68.0000 mg | DRUG_IMPLANT | Freq: Once | SUBCUTANEOUS | Status: AC
  Administered 2016-08-15: 68 mg via SUBCUTANEOUS

## 2016-08-15 MED ORDER — ETONOGESTREL 68 MG ~~LOC~~ IMPL: 68.0000 mg | DRUG_IMPLANT | Freq: Once | SUBCUTANEOUS | Status: AC

## 2016-08-15 NOTE — Patient Instructions (Signed)
Etonogestrel implant What is this medicine? ETONOGESTREL (et oh noe JES trel) is a contraceptive (birth control) device. It is used to prevent pregnancy. It can be used for up to 3 years. This medicine may be used for other purposes; ask your health care provider or pharmacist if you have questions. What should I tell my health care provider before I take this medicine? They need to know if you have any of these conditions: -abnormal vaginal bleeding -blood vessel disease or blood clots -cancer of the breast, cervix, or liver -depression -diabetes -gallbladder disease -headaches -heart disease or recent heart attack -high blood pressure -high cholesterol -kidney disease -liver disease -renal disease -seizures -tobacco smoker -an unusual or allergic reaction to etonogestrel, other hormones, anesthetics or antiseptics, medicines, foods, dyes, or preservatives -pregnant or trying to get pregnant -breast-feeding How should I use this medicine? This device is inserted just under the skin on the inner side of your upper arm by a health care professional. Talk to your pediatrician regarding the use of this medicine in children. Special care may be needed. Overdosage: If you think you have taken too much of this medicine contact a poison control center or emergency room at once. NOTE: This medicine is only for you. Do not share this medicine with others. What if I miss a dose? This does not apply. What may interact with this medicine? Do not take this medicine with any of the following medications: -amprenavir -bosentan -fosamprenavir This medicine may also interact with the following medications: -barbiturate medicines for inducing sleep or treating seizures -certain medicines for fungal infections like ketoconazole and itraconazole -griseofulvin -medicines to treat seizures like carbamazepine, felbamate, oxcarbazepine, phenytoin,  topiramate -modafinil -phenylbutazone -rifampin -some medicines to treat HIV infection like atazanavir, indinavir, lopinavir, nelfinavir, tipranavir, ritonavir -St. John's wort This list may not describe all possible interactions. Give your health care provider a list of all the medicines, herbs, non-prescription drugs, or dietary supplements you use. Also tell them if you smoke, drink alcohol, or use illegal drugs. Some items may interact with your medicine. What should I watch for while using this medicine? This product does not protect you against HIV infection (AIDS) or other sexually transmitted diseases. You should be able to feel the implant by pressing your fingertips over the skin where it was inserted. Contact your doctor if you cannot feel the implant, and use a non-hormonal birth control method (such as condoms) until your doctor confirms that the implant is in place. If you feel that the implant may have broken or become bent while in your arm, contact your healthcare provider. What side effects may I notice from receiving this medicine? Side effects that you should report to your doctor or health care professional as soon as possible: -allergic reactions like skin rash, itching or hives, swelling of the face, lips, or tongue -breast lumps -changes in emotions or moods -depressed mood -heavy or prolonged menstrual bleeding -pain, irritation, swelling, or bruising at the insertion site -scar at site of insertion -signs of infection at the insertion site such as fever, and skin redness, pain or discharge -signs of pregnancy -signs and symptoms of a blood clot such as breathing problems; changes in vision; chest pain; severe, sudden headache; pain, swelling, warmth in the leg; trouble speaking; sudden numbness or weakness of the face, arm or leg -signs and symptoms of liver injury like dark yellow or brown urine; general ill feeling or flu-like symptoms; light-colored stools; loss of  appetite; nausea; right upper belly   pain; unusually weak or tired; yellowing of the eyes or skin -unusual vaginal bleeding, discharge -signs and symptoms of a stroke like changes in vision; confusion; trouble speaking or understanding; severe headaches; sudden numbness or weakness of the face, arm or leg; trouble walking; dizziness; loss of balance or coordination Side effects that usually do not require medical attention (Report these to your doctor or health care professional if they continue or are bothersome.): -acne -back pain -breast pain -changes in weight -dizziness -general ill feeling or flu-like symptoms -headache -irregular menstrual bleeding -nausea -sore throat -vaginal irritation or inflammation This list may not describe all possible side effects. Call your doctor for medical advice about side effects. You may report side effects to FDA at 1-800-FDA-1088. Where should I keep my medicine? This drug is given in a hospital or clinic and will not be stored at home. NOTE: This sheet is a summary. It may not cover all possible information. If you have questions about this medicine, talk to your doctor, pharmacist, or health care provider.    2016, Elsevier/Gold Standard. (2014-07-21 14:07:06)  

## 2016-08-15 NOTE — Addendum Note (Signed)
Addended by: Garret Reddish on: 08/15/2016 10:20 AM   Modules accepted: Orders

## 2016-08-15 NOTE — Progress Notes (Signed)
     GYNECOLOGY CLINIC PROCEDURE NOTE  Makayla Vasquez is a 35 y.o. G1P1001 with multiple cardiac problems here for Nexplanon removal and insertion.  Last pap smear was earlier this year at an outside facility and was normal.  No other gynecologic concerns.  Nexplanon Removal and Insertion  Patient identified, informed consent performed, consent signed.   Patient does understand that irregular bleeding is a very common side effect of this medication. She was advised to have backup contraception for one week after replacement of the implant. Pregnancy test in clinic today was negative.  Appropriate time out taken. Implanon site identified. Area prepped in usual sterile fashon. One ml of 1% lidocaine was used to anesthetize the area at the distal end of the implant. A small stab incision was made right beside the implant on the distal portion. The Nexplanon rod was grasped using hemostats and removed without difficulty. There was minimal blood loss. There were no complications. Area was then injected with 3 ml of 1 % lidocaine. She was re-prepped with betadine, Nexplanon removed from packaging, Device confirmed in needle, then inserted full length of needle and withdrawn per handbook instructions. Nexplanon was able to palpated in the patient's arm; patient palpated the insert herself.  There was minimal blood loss. Patient insertion site covered with guaze and a pressure bandage to reduce any bruising. The patient tolerated the procedure well and was given post procedure instructions.  She was advised to have backup contraception for one week.     Jaynie Collins, MD, FACOG Attending Obstetrician & Gynecologist, [facility]

## 2016-08-28 DIAGNOSIS — R262 Difficulty in walking, not elsewhere classified: Secondary | ICD-10-CM | POA: Diagnosis not present

## 2016-08-30 IMAGING — CR DG CHEST 2V
2 series · 2 of 2 positions shown · non-contrast
Comparison: July 06, 2014

CLINICAL DATA: Interstitial lung disease, chronic respiratory
failure.

EXAM:
CHEST  2 VIEW

[view not recorded (1 of 2)]
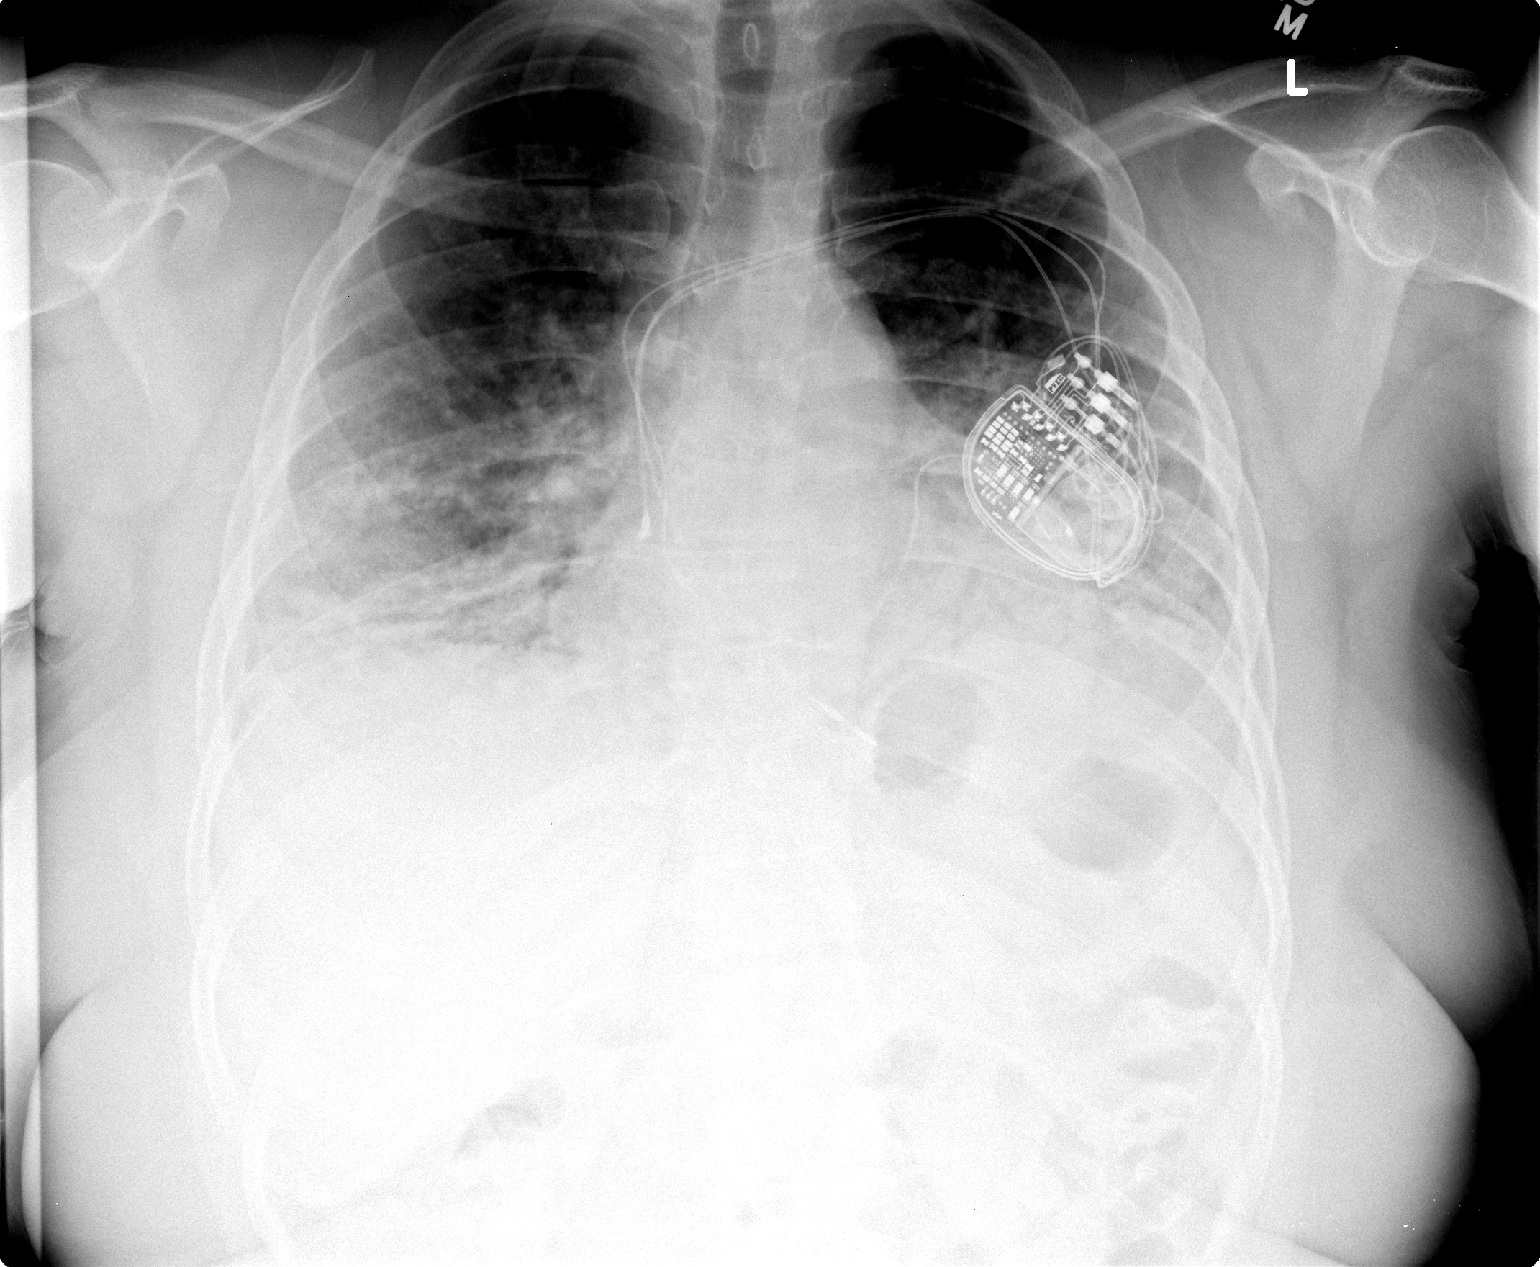

[view not recorded (2 of 2)]
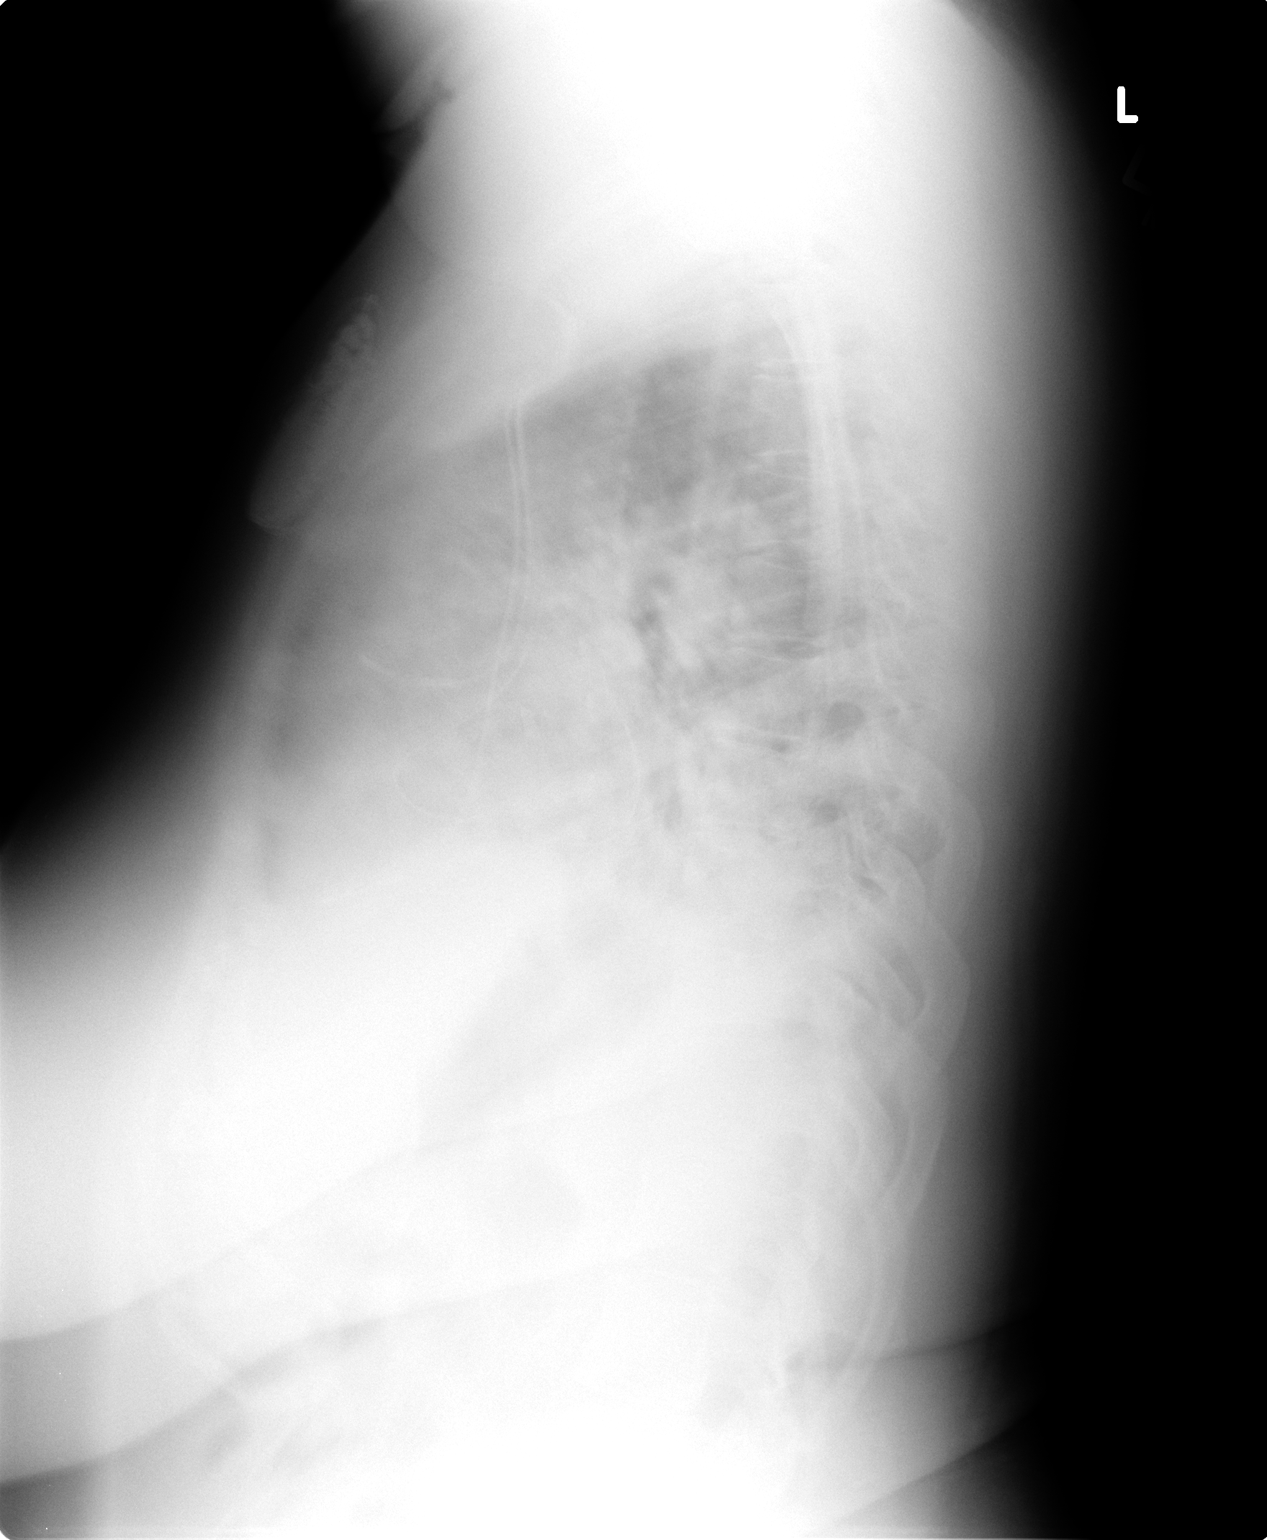

[2 of 2 positions shown; findings below may reference images not displayed]

FINDINGS: The heart size and mediastinal contours are stable. Cardiomegaly
with cardiac pacemaker is unchanged. Consolidation of bilateral lung
bases are unchanged. The visualized skeletal structures are stable.
IMPRESSION: Consolidation of bilateral lung bases are unchanged compared to
previous exam.

## 2016-09-19 ENCOUNTER — Ambulatory Visit: Payer: Medicare Other | Admitting: Internal Medicine

## 2016-09-23 ENCOUNTER — Ambulatory Visit (INDEPENDENT_AMBULATORY_CARE_PROVIDER_SITE_OTHER): Payer: Medicare Other | Admitting: Internal Medicine

## 2016-09-23 ENCOUNTER — Encounter (HOSPITAL_COMMUNITY): Payer: Self-pay | Admitting: Emergency Medicine

## 2016-09-23 ENCOUNTER — Inpatient Hospital Stay: Admission: AD | Admit: 2016-09-23 | Payer: Medicare Other | Source: Ambulatory Visit | Admitting: Family Medicine

## 2016-09-23 ENCOUNTER — Inpatient Hospital Stay (HOSPITAL_COMMUNITY): Payer: Medicare Other

## 2016-09-23 ENCOUNTER — Encounter: Payer: Self-pay | Admitting: Internal Medicine

## 2016-09-23 ENCOUNTER — Inpatient Hospital Stay (HOSPITAL_COMMUNITY)
Admission: EM | Admit: 2016-09-23 | Discharge: 2016-09-27 | DRG: 637 | Disposition: A | Discharge status: Home / Self Care | Payer: Medicare Other | Attending: Family Medicine | Admitting: Family Medicine

## 2016-09-23 ENCOUNTER — Other Ambulatory Visit: Payer: Self-pay

## 2016-09-23 VITALS — BP 122/90 | HR 123 | Temp 98.0°F | Wt 144.0 lb

## 2016-09-23 DIAGNOSIS — Z8249 Family history of ischemic heart disease and other diseases of the circulatory system: Secondary | ICD-10-CM | POA: Diagnosis not present

## 2016-09-23 DIAGNOSIS — Z87891 Personal history of nicotine dependence: Secondary | ICD-10-CM

## 2016-09-23 DIAGNOSIS — K5900 Constipation, unspecified: Secondary | ICD-10-CM | POA: Diagnosis present

## 2016-09-23 DIAGNOSIS — J9611 Chronic respiratory failure with hypoxia: Secondary | ICD-10-CM | POA: Diagnosis present

## 2016-09-23 DIAGNOSIS — R739 Hyperglycemia, unspecified: Secondary | ICD-10-CM | POA: Diagnosis not present

## 2016-09-23 DIAGNOSIS — M069 Rheumatoid arthritis, unspecified: Secondary | ICD-10-CM | POA: Diagnosis present

## 2016-09-23 DIAGNOSIS — E669 Obesity, unspecified: Secondary | ICD-10-CM

## 2016-09-23 DIAGNOSIS — Z8679 Personal history of other diseases of the circulatory system: Secondary | ICD-10-CM | POA: Diagnosis not present

## 2016-09-23 DIAGNOSIS — I420 Dilated cardiomyopathy: Secondary | ICD-10-CM | POA: Diagnosis present

## 2016-09-23 DIAGNOSIS — R1084 Generalized abdominal pain: Secondary | ICD-10-CM | POA: Diagnosis not present

## 2016-09-23 DIAGNOSIS — R824 Acetonuria: Secondary | ICD-10-CM

## 2016-09-23 DIAGNOSIS — R5383 Other fatigue: Secondary | ICD-10-CM

## 2016-09-23 DIAGNOSIS — I110 Hypertensive heart disease with heart failure: Secondary | ICD-10-CM | POA: Diagnosis present

## 2016-09-23 DIAGNOSIS — Q246 Congenital heart block: Secondary | ICD-10-CM | POA: Diagnosis not present

## 2016-09-23 DIAGNOSIS — Z7952 Long term (current) use of systemic steroids: Secondary | ICD-10-CM

## 2016-09-23 DIAGNOSIS — E785 Hyperlipidemia, unspecified: Secondary | ICD-10-CM | POA: Diagnosis present

## 2016-09-23 DIAGNOSIS — D638 Anemia in other chronic diseases classified elsewhere: Secondary | ICD-10-CM | POA: Diagnosis present

## 2016-09-23 DIAGNOSIS — E1169 Type 2 diabetes mellitus with other specified complication: Secondary | ICD-10-CM | POA: Diagnosis not present

## 2016-09-23 DIAGNOSIS — I5022 Chronic systolic (congestive) heart failure: Secondary | ICD-10-CM | POA: Diagnosis present

## 2016-09-23 DIAGNOSIS — R358 Other polyuria: Secondary | ICD-10-CM | POA: Diagnosis not present

## 2016-09-23 DIAGNOSIS — R3589 Other polyuria: Secondary | ICD-10-CM

## 2016-09-23 DIAGNOSIS — E11649 Type 2 diabetes mellitus with hypoglycemia without coma: Secondary | ICD-10-CM | POA: Diagnosis present

## 2016-09-23 DIAGNOSIS — Z950 Presence of cardiac pacemaker: Secondary | ICD-10-CM

## 2016-09-23 DIAGNOSIS — M329 Systemic lupus erythematosus, unspecified: Secondary | ICD-10-CM | POA: Diagnosis present

## 2016-09-23 DIAGNOSIS — Z794 Long term (current) use of insulin: Secondary | ICD-10-CM | POA: Diagnosis not present

## 2016-09-23 DIAGNOSIS — E1165 Type 2 diabetes mellitus with hyperglycemia: Secondary | ICD-10-CM | POA: Diagnosis not present

## 2016-09-23 DIAGNOSIS — R918 Other nonspecific abnormal finding of lung field: Secondary | ICD-10-CM | POA: Diagnosis not present

## 2016-09-23 DIAGNOSIS — Z9981 Dependence on supplemental oxygen: Secondary | ICD-10-CM

## 2016-09-23 DIAGNOSIS — K219 Gastro-esophageal reflux disease without esophagitis: Secondary | ICD-10-CM | POA: Diagnosis present

## 2016-09-23 DIAGNOSIS — J849 Interstitial pulmonary disease, unspecified: Secondary | ICD-10-CM | POA: Diagnosis present

## 2016-09-23 DIAGNOSIS — R109 Unspecified abdominal pain: Secondary | ICD-10-CM | POA: Diagnosis not present

## 2016-09-23 DIAGNOSIS — E118 Type 2 diabetes mellitus with unspecified complications: Secondary | ICD-10-CM

## 2016-09-23 LAB — URINALYSIS, ROUTINE W REFLEX MICROSCOPIC
Bacteria, UA: NONE SEEN
Bilirubin Urine: NEGATIVE
Hgb urine dipstick: NEGATIVE
Ketones, ur: 20 mg/dL — AB
Leukocytes, UA: NEGATIVE
Nitrite: NEGATIVE
PH: 6 (ref 5.0–8.0)
Protein, ur: NEGATIVE mg/dL
Specific Gravity, Urine: 1.024 (ref 1.005–1.030)

## 2016-09-23 LAB — I-STAT ARTERIAL BLOOD GAS, ED
Acid-base deficit: 2 mmol/L (ref 0.0–2.0)
Bicarbonate: 22.1 mmol/L (ref 20.0–28.0)
O2 Saturation: 99 %
PCO2 ART: 34.9 mmHg (ref 32.0–48.0)
PH ART: 7.41 (ref 7.350–7.450)
Patient temperature: 98.6
TCO2: 23 mmol/L (ref 0–100)
pO2, Arterial: 161 mmHg — ABNORMAL HIGH (ref 83.0–108.0)

## 2016-09-23 LAB — BASIC METABOLIC PANEL
Anion gap: 10 (ref 5–15)
BUN: 12 mg/dL (ref 6–20)
CHLORIDE: 104 mmol/L (ref 101–111)
CO2: 20 mmol/L — AB (ref 22–32)
CREATININE: 0.78 mg/dL (ref 0.44–1.00)
Calcium: 9.2 mg/dL (ref 8.9–10.3)
GFR calc Af Amer: >60 mL/min (ref >60)
GFR calc non Af Amer: >60 mL/min (ref >60)
GLUCOSE: 586 mg/dL — AB (ref 65–99)
Potassium: 4.6 mmol/L (ref 3.5–5.1)
SODIUM: 134 mmol/L — AB (ref 135–145)

## 2016-09-23 LAB — CBC
HCT: 40.2 % (ref 36.0–46.0)
Hemoglobin: 13 g/dL (ref 12.0–15.0)
MCH: 28.3 pg (ref 26.0–34.0)
MCHC: 32.3 g/dL (ref 30.0–36.0)
MCV: 87.4 fL (ref 78.0–100.0)
PLATELETS: 273 10*3/uL (ref 150–400)
RBC: 4.6 MIL/uL (ref 3.87–5.11)
RDW: 15.1 % (ref 11.5–15.5)
WBC: 7.3 10*3/uL (ref 4.0–10.5)

## 2016-09-23 LAB — CBG MONITORING, ED
Glucose-Capillary: 309 mg/dL — ABNORMAL HIGH (ref 65–99)
Glucose-Capillary: 463 mg/dL — ABNORMAL HIGH (ref 65–99)
Glucose-Capillary: 557 mg/dL (ref 65–99)

## 2016-09-23 LAB — GLUCOSE, POCT (MANUAL RESULT ENTRY): POC GLUCOSE: 511 mg/dL — AB (ref 70–99)

## 2016-09-23 LAB — PHOSPHORUS: PHOSPHORUS: 3.5 mg/dL (ref 2.5–4.6)

## 2016-09-23 LAB — I-STAT BETA HCG BLOOD, ED (MC, WL, AP ONLY): I-stat hCG, quantitative: <5 m[IU]/mL (ref <5)

## 2016-09-23 LAB — POCT GLYCOSYLATED HEMOGLOBIN (HGB A1C): Hemoglobin A1C: >15

## 2016-09-23 LAB — MAGNESIUM: MAGNESIUM: 1.7 mg/dL (ref 1.7–2.4)

## 2016-09-23 MED ORDER — BISOPROLOL FUMARATE 5 MG PO TABS
2.5000 mg | ORAL_TABLET | Freq: Every day | ORAL | Status: DC
  Administered 2016-09-24 – 2016-09-25 (×2): 2.5 mg via ORAL
  Filled 2016-09-23: qty 0.5
  Filled 2016-09-23 (×2): qty 1
  Filled 2016-09-23: qty 0.5

## 2016-09-23 MED ORDER — SENNOSIDES-DOCUSATE SODIUM 8.6-50 MG PO TABS
3.0000 | ORAL_TABLET | Freq: Two times a day (BID) | ORAL | Status: DC | PRN
  Filled 2016-09-23: qty 3

## 2016-09-23 MED ORDER — DEXTROSE 50 % IV SOLN: 25.0000 mL | INTRAVENOUS | Status: DC | PRN

## 2016-09-23 MED ORDER — LOSARTAN POTASSIUM 25 MG PO TABS
12.5000 mg | ORAL_TABLET | Freq: Every day | ORAL | Status: DC
  Filled 2016-09-23 (×3): qty 0.5

## 2016-09-23 MED ORDER — ACETAMINOPHEN 650 MG RE SUPP: 650.0000 mg | Freq: Four times a day (QID) | RECTAL | Status: DC | PRN

## 2016-09-23 MED ORDER — INSULIN REGULAR BOLUS VIA INFUSION
0.0000 [IU] | Freq: Three times a day (TID) | INTRAVENOUS | Status: DC
  Filled 2016-09-23: qty 10

## 2016-09-23 MED ORDER — SODIUM CHLORIDE 0.9 % IV SOLN
INTRAVENOUS | Status: DC
  Administered 2016-09-23: 2.5 [IU]/h via INTRAVENOUS
  Filled 2016-09-23: qty 2.5

## 2016-09-23 MED ORDER — MYCOPHENOLATE MOFETIL 250 MG PO CAPS
500.0000 mg | ORAL_CAPSULE | Freq: Two times a day (BID) | ORAL | Status: DC
  Filled 2016-09-23 (×2): qty 2

## 2016-09-23 MED ORDER — PANTOPRAZOLE SODIUM 40 MG PO TBEC
40.0000 mg | DELAYED_RELEASE_TABLET | Freq: Every day | ORAL | Status: DC
Start: 2016-09-24 — End: 2016-09-27
  Administered 2016-09-24 – 2016-09-27 (×4): 40 mg via ORAL
  Filled 2016-09-23 (×4): qty 1

## 2016-09-23 MED ORDER — POLYETHYLENE GLYCOL 3350 17 G PO PACK: 17.0000 g | PACK | Freq: Every day | ORAL | Status: DC | PRN

## 2016-09-23 MED ORDER — SODIUM CHLORIDE 0.9% FLUSH
3.0000 mL | Freq: Two times a day (BID) | INTRAVENOUS | Status: DC
  Administered 2016-09-24 – 2016-09-27 (×5): 3 mL via INTRAVENOUS

## 2016-09-23 MED ORDER — DEXTROSE-NACL 5-0.45 % IV SOLN
INTRAVENOUS | Status: DC
  Administered 2016-09-24 (×2): via INTRAVENOUS

## 2016-09-23 MED ORDER — ACETAMINOPHEN 325 MG PO TABS
650.0000 mg | ORAL_TABLET | Freq: Four times a day (QID) | ORAL | Status: DC | PRN
  Administered 2016-09-24: 650 mg via ORAL
  Filled 2016-09-23: qty 2

## 2016-09-23 MED ORDER — SODIUM CHLORIDE 0.9 % IV BOLUS (SEPSIS)
500.0000 mL | Freq: Once | INTRAVENOUS | Status: AC
Start: 2016-09-23 — End: 2016-09-23
  Administered 2016-09-23: 500 mL via INTRAVENOUS

## 2016-09-23 MED ORDER — ENOXAPARIN SODIUM 40 MG/0.4ML ~~LOC~~ SOLN
40.0000 mg | SUBCUTANEOUS | Status: DC
  Administered 2016-09-24 – 2016-09-27 (×4): 40 mg via SUBCUTANEOUS
  Filled 2016-09-23 (×4): qty 0.4

## 2016-09-23 MED ORDER — SODIUM CHLORIDE 0.9 % IV SOLN: INTRAVENOUS | Status: DC

## 2016-09-23 MED ORDER — FUROSEMIDE 20 MG PO TABS
20.0000 mg | ORAL_TABLET | Freq: Every day | ORAL | Status: DC
  Administered 2016-09-24 – 2016-09-27 (×4): 20 mg via ORAL
  Filled 2016-09-23 (×4): qty 1

## 2016-09-23 NOTE — ED Notes (Signed)
Paged admitting doctor about Pt being on Tele bed do a shift in her potassium if or when she gets insulin.  MD said Med-Surg bed is ok.

## 2016-09-23 NOTE — ED Triage Notes (Signed)
Pt states "I was sent here by my doctor to get admitted for high blood sugar". Pt states "I feel alright just sleepy". Pt hx of lung disease. Pt on 3L [place], is on home O2.

## 2016-09-23 NOTE — H&P (Signed)
[facility] Admission History and Physical Service Pager: [phone]  Patient name: Makayla Vasquez Medical record number: [id number] Date of birth: 04-03-1981 Age: 35 y.o. Gender: female  Primary Care Provider: Adin Hector, MD Consultants: None Code Status: Full code  Chief Complaint: hyperglycemia  Assessment and Plan: Makayla Vasquez is a 35 y.o. female presenting with hyperglycemia . PMH is significant for T2DM,  congenital heart block with BV pacemaker, chronic systolic heart failure. Anti-synthetase syndrome with interstitial lung disease   Type 2 diabetic with severe hyperglycemia:  No evidence of DKA, suspicious for HHS however hyperglycemia would be on the lower range of what is typically expected, and patient does not appear overly dehydrated. Because of this would not fully classify patient within the "HHS" category. CBG in clinic to 511 up to 586 in ED.  Bicarb 22, anion gap of 10, normal mg2+ and phosphorus. UA showed >20 ketones.  Endorses increased urination for past three weeks and occasional blurry vision, as well as diffuse abdominal pain.  Takes 26U lantus and metformin 1016m BID at home. Hgb A1C today was >15.  Given history of heart failure, plan to gently hydrate patient with 1.5 MIVF and attempt to bring down glucose with glucomander.  K+ wnl at 4.6.   - Admit to family medicine teaching service; attending physician Dr. NNori Riis - IV insulin w/ glucomander - closely monitor K+ >> preemptively replete 40 mEq K+ - gently hydrate @1 .5x MIVF - ABG normal - Restart home insulin regimen once able to transition off of Glucomander  Abdominal pain: Diffuse abdominal pain without peritoneal signs or guarding.  No leukocytosis or systemic signs. Last BM was two days ago.  Denies NVD or sick contacts. At this time unknown etiology, however likely 2/2 combination of hyperglycemia and constipation, but cannot rule out pancreatitis, colitis,  cholecystitis, etc. - f/u KUB - Obtain lipase - continue to monitor and currently holding on pain medication - senna PRN, miralax BID PRN and   Chronic systolic heart failure/congestive dilated cardiomyopathy: Due to NICM last seen by Dr. MAundra Dubinon 05/15/16.  Last echo was 8/17 and showed 50-55% EF and normal systolic and diastolic function of the LV.  Does not appear fluid overloaded on exam, no peripheral edema, no bibasilar lung crackles. No reports of pressure-like chest pain or worsening shortness of breath. - Strict I/O - Obtain EKG - Obtain troponins - daily weights - Continue home lasix 221mOral   Congenital complete AV block, with pacemaker in situ: Pacemaker in place. No issues of late. - Monitor  Anti-synthetase syndrome with ILD:  [facility] Takes 17.46m62maily prednisone and requies 2-4L O2 at home.  Currently on 3L [place] satting 100%.  [facility] [place]- Continue prednisone - continue cellcept - continue home O2 - albuterol PRN  HTN: Stable.  Takes coreg 12.46mg46mily and zebeta 2.5 mg daily. - continue home regimen  Bipolar affective disorder: On no medications for this at this time. - Monitor   FEN/GI: NPO/1.5 MIVF Prophylaxis: lovenox  Disposition: admit to SDU under attending NealNori Riisstory of Present Illness:  Makayla Vasquez 35 y57. female presenting with hyperglycemia after being seen in clinic.   Patient with complex medical history including, but not limited to anti-synthetase syndrome on chronic prednisone with chronic interstitial lung disease on home O2, congenital heart block s/p pacemaker insertion, systolic CHF and  T2DM who presents from clinic with hyperglycemia. Seen in the office by Dr. Avon Gully today and noted to have CBG to 511.  Reports increased urination past 3 weeks so much that she has had to wear briefs.  Feels like she is getting up every hour to use the  restroom. Urine does not appear concentrated.  No changes in her insulin regimen recently. Has been diabetic for 2-3 years and takes  26U lantus and 1059m BID metformin daily.  She receives no sliding scale or mealtime coverage.  Of note, she is also on chronic prednisone 17.513mdaily for her anti-synthetase syndrome by her rheumatologist.   In addition to increased urination, she has diffuse abdominal pain rated 6/10.  Last BM was two days ago.  She is not able to take anything to relieve the pain.  Endorses CP and heart racing with exertion and denies any SOB. Takes protonix at home and apparently ran out of the medication and has had some chest discomfort since then.  No swelling of her ankles or feet and denies NVD.  Does have chills occassionally, but denies fevers. Endorses blurry vision associated with high blood sugars, but no vision changes.    Review Of Systems: Per HPI with the following additions:  ROS  Patient Active Problem List   Diagnosis Date Noted  . Hyperglycemia 09/23/2016  . Breast pain, right 04/14/2016  . Chronic diastolic congestive heart failure (HCLakewood Shores  . Bacteremia 02/24/2016  . Myalgia and myositis   . Type 2 diabetes mellitus with complication, with long-term current use of insulin (HCTumalo  . Anemia of chronic disease   . Bipolar affective disorder in remission (HCRiner  . Generalized weakness   . Acute blood loss anemia   . Cardiac pacemaker in situ   . Adrenal insufficiency (HCMonessen  . Acute on chronic diastolic CHF (congestive heart failure) (HCTamalpais-Homestead [place]  . Antisynthetase syndrome (HCBoiling Springs  . Hypotension 07/08/2015  . Hyperlipidemia associated with type 2 diabetes mellitus (HCGaylesville07/15/2016  . Screening for cervical cancer 01/19/2015  . Onychomycosis 12/14/2014  . CHF (congestive heart failure) (HCTen Mile [place]  . Chronic respiratory failure with hypoxia (HCHaralson10/19/2015  . Insertion of implantable subdermal contraceptive 07/12/2014  . ILD (interstitial lung disease) (HCFairview 06/19/2014  . Congestive dilated cardiomyopathy (HCDelhi08/09/2014  . Vaginal itching 05/22/2014  . Chronic systolic heart failure (HCOsborne05/06/2012  . PPM-[employer]   . Congenital complete AV block   . GERD (gastroesophageal reflux disease)   . THYROGLOSSAL DUCT CYST 07/18/2010  . RH FACTOR, NEGATIVE 04/08/2010  . Mild intermittent asthma without complication [id number]. POLYCYSTIC OVARY 12/17/2006    Past Medical History: Past Medical History:  Diagnosis Date  . Anxiety   . Arthritis    rheumatoid arthritis- mild, no rheumatology care   . Asthma    seasonal allergies   . Asymptomatic LV dysfunction    a. Echo in Dec 2011 with EF 35 to 40%. Felt to be due to paced rhythm. b. EF 25-30% in 07/2014.  . Bipolar affective disorder (HCEast Tawas  . Cardiac pacemaker    a. Since age [age]n 1971b. Upgrade to BiV in 2013.  . 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 CHF (congestive heart failure) (HCGeorgetown  . Congenital complete AV block   . Depression    bipolar  . Diabetes mellitus without complication (HCLauderdale Lakes  . GERD (gastroesophageal reflux disease)   . Hypertension   .  Lupus   . Lupus (systemic lupus erythematosus) ([place])   . Obesity   . Pneumonitis    a. a/w hypoxia - inflammatory - large workup 07/2014.  . Presence of permanent cardiac pacemaker   . Seizures ([place])    as a child- from high fever  . Sinus tachycardia     Past Surgical History: Past Surgical History:  Procedure Laterality Date  . ATRIAL TACH ABLATION N/A 08/14/2014   Procedure: ATRIAL TACH ABLATION;  Surgeon: [name], MD;  Location[facility] C[facility] LAB;  Service: Cardiovascular;  Laterality: N/A;  . BI-VENTRICULAR PACEMAKER UPGRADE N/A 03/08/2012   Procedure: BI-VENTRICULAR PACEMAKER UPGRADE;  Surgeon: [name], MD;  Location: [facility] CATH LAB;  Service: Cardiovascular;  Laterality: N/A;  . CARPAL TUNNEL WITH CUBITAL TUNNEL Right 07/26/2013   Procedure: RIGHT LIMITED OPEN CARPAL TUNNEL RELEASE ,  RIGHT CUBITAL  TUNNEL RELEASE, INSITU VERSES ULNAR NERVE DECOMPRESSION AND ANTERIOR TRANSPOSITION;  Surgeon: [name], MD;  Location: [place];  Service: Orthopedics;  Laterality: Right;  . CESAREAN SECTION    . CHOLECYSTECTOMY    . CYST EXCISION  12/10/2012   THYROID  . INSERT / REPLACE / REMOVE PACEMAKER     2001  . IUD REMOVAL  11/03/2011   Procedure: INTRAUTERINE DEVICE (IUD) REMOVAL;  Surgeon: [name], MD;  Location: [place] [facility];  Service: Gynecology;  Laterality: N/A;  . NASAL FRACTURE SURGERY     /w plate   . RIGHT HEART CATHETERIZATION N/A 10/26/2014   Procedure: RIGHT HEART CATH;  Surgeon: [name], MD;  [facility] CATH LAB;  Service: Cardiovascular;  Laterality: N/A;  . THROAT SURGERY  1994   s/p laser treatment  . THYROGLOSSAL DUCT CYST N/A 12/10/2012   Procedure: REVISION OF THYROGLOSSAL DUCT CYST EXCISION;  Surgeon: [name], MD;  Location: [place];  Service: ENT;  Laterality: N/A;  Revision of Thyroglossal Duct Cyst Excision  . VIDEO BRONCHOSCOPY Bilateral 06/19/2014   Procedure: VIDEO BRONCHOSCOPY WITHOUT FLUORO;  Surgeon: Brand Males, MD;  Location: [place];  Service: Cardiopulmonary;  Laterality: Bilateral;    Social History: Social History  Substance Use Topics  . Smoking status: Former Smoker    Packs/day: 0.25    Years: 0.50    Types: Cigarettes    Quit date: 07/25/1996  . Smokeless tobacco: Never Used  . Alcohol use No   Please also refer to relevant sections of EMR.  Family History: Family History  Problem Relation Age of Onset  . Heart disease Mother     CHF (no details)  . Hypertension Mother   . Lupus Mother   . Heart disease Father     Murmur  . Heart disease Sister 70     No details.  History of a pacemaker     Allergies and Medications: Allergies  Allergen Reactions  . 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 Sertraline Hcl Hives  . Tape Other (See Comments)     Burns skin   No current facility-administered medications on file prior to encounter.    Current Outpatient Prescriptions on File Prior to Encounter  Medication Sig Dispense Refill  . bisoprolol (ZEBETA) 5 MG tablet Take 0.5 tablets (2.5 mg total) by mouth daily. 15 tablet 6  . calcium-vitamin D (OSCAL WITH D) 500-200 MG-UNIT per tablet Take 1 tablet by mouth daily with breakfast. Reported on 04/15/2016    . fluocinonide (LIDEX) 0.05 % external  solution 1 APPLICATION APPLY ON THE SKIN AS DIRECTED APPLY TO AFFECTED AREAS DAILY ON SCALP AS NEEDED FOR ITCHING.  5  . fluticasone (FLONASE) 50 MCG/ACT nasal spray Place 2 sprays into both nostrils daily. 16 g 6  . furosemide (LASIX) 20 MG tablet TAKE 1 TABLET BY MOUTH EVERY DAY 30 tablet 5  . Insulin Glargine (LANTUS SOLOSTAR) 100 UNIT/ML Solostar Pen Inject 15 Units into the skin daily. 15 mL 5  . losartan (COZAAR) 25 MG tablet Take 0.5 tablets (12.5 mg total) by mouth daily. 15 tablet 6  . metFORMIN (GLUCOPHAGE) 1000 MG tablet TAKE 1 TABLET BY MOUTH TWICE A DAY WITH A MEAL 60 tablet 5  . mycophenolate (CELLCEPT) 500 MG tablet Take 750 mg by mouth 2 (two) times daily.    [name] oxymetazoline (AFRIN) 0.05 % nasal spray Place 1 spray into both nostrils 2 (two) times daily. DO NOT USE for more than 3 days. 30 mL 0  . pantoprazole (PROTONIX) 40 MG tablet Take 1 tablet (40 mg total) by mouth daily. 30 tablet 5  . polyethylene glycol (MIRALAX / GLYCOLAX) packet Take 17 g by mouth daily after supper. (Patient taking differently: Take 17 g by mouth daily as needed for mild constipation. ) 14 each 0  . predniSONE (DELTASONE) 5 MG tablet Take 17.5 mg by mouth daily with breakfast.     . senna-docusate (SENOKOT-S) 8.6-50 MG tablet Take 3 tablets by mouth 2 (two) times daily as needed for mild constipation. 100 tablet 0  . Blood Glucose Monitoring Suppl (ONE TOUCH ULTRA 2) w/Device KIT Check blood sugar before meals and at bedtim 1 each 0  . camphor-menthol (SARNA)  lotion Apply topically 2 (two) times daily. (Patient not taking: Reported on 09/23/2016) 222 mL 0  . furosemide (LASIX) 20 MG tablet Take 1 tablet (20 mg total) by mouth every other day. 30 tablet 5  . loratadine (CLARITIN) 10 MG tablet Take 1 tablet (10 mg total) by mouth daily. (Patient not taking: Reported on 09/23/2016) 30 tablet 5  . NONFORMULARY OR COMPOUNDED ITEM [facility]:  Onychomycosis Nail Lacquer - Fluconazole 2%, Terbinafine 1%, apply to affected area daily. (Patient not taking: Reported on 09/23/2016) 120 each 2  . oxyCODONE-acetaminophen (PERCOCET/ROXICET) 5-325 MG tablet Take 1 tablet by mouth every 6 (six) hours as needed for severe pain. (Patient not taking: Reported on 09/23/2016) 60 tablet 0    Objective: BP 113/77   Pulse 84   Temp 97.6 F (36.4 C) (Oral)   Resp 18   SpO2 99%  Exam: General: 35yo F sitting up in bed appearing comfortable with [place] in place Eyes: non-injected, EOMI, PERRL ENTM: MMM, clear oropharynx Neck: supple, no LAD; surgical scar noted along the midline at base of neck Cardiovascular: RRR, no murmurs, palpable pulses Respiratory: NWOB, CTABL, no wheezing or rhonchi Gastrointestinal: soft, mild diffuse tenderness without guarding and no peritoneal signs present MSK: no deformities, able to move all extremities Derm: warm and dry, no rashes noted Neuro: AAOx3 Psych: Normal mood and affect  Labs and Imaging: CBC BMET   Recent Labs Lab 09/23/16 1724  WBC 7.3  HGB 13.0  HCT 40.2  PLT 273    Recent Labs Lab 09/23/16 1724  NA 134*  K 4.6  CL 104  CO2 20*  BUN 12  CREATININE 0.78  GLUCOSE 586*  CALCIUM 9.2     Eloise Levels, MD 09/23/2016, 8:01 PM PGY-1, [place] Intern pager: [phone], text pages welcome  Upper Level Addendum:  I have seen and evaluated this patient along with Dr. Rosalyn Gess and reviewed the above note, making necessary revisions in red.   Elberta Leatherwood, MD,MS,  PGY3 09/24/2016 1:15  AM

## 2016-09-23 NOTE — Patient Instructions (Addendum)
It was nice meeting you today Ms. Spoon!  PLEASE GO DIRECTLY TO THE EMERGENCY ROOM. They will likely give you IV fluids and perform blood tests, and then admit you to the hospital.   Be well,  Dr. Natale Milch

## 2016-09-23 NOTE — Assessment & Plan Note (Addendum)
CBG 511 in office today. Patient's tachycardic to 123, and reporting abdominal pain, blurred vision, increased urinary frequency, and somnolence. A1C >15. Spoke with patient regarding inpatient admission vs going to ED vs outpatient management with adjustments in insulin and close follow-up. Patient concerned about managing hyperglycemia and making insulin adjustments at home, and prefers to be directly admitted. Hospital called for direct admit, however no beds available, so patient was sent directly to ED.  - Sent to ED for hyperglycemia  - Anticipate patient will need much higher dose of insulin long-term (currently only on 26U Lantus qd and metformin 1000mg  BID). Patient will also need to be strongly encouraged to closely follow up in clinic, including appointments with pharmacist Dr. for better DM control

## 2016-09-23 NOTE — ED Provider Notes (Signed)
[facility] DEPT Provider Note   CSN: [id number] Arrival date & time: 09/23/16  1703     History   Chief Complaint Chief Complaint  Patient presents with  . Hyperglycemia    HPI Makayla Vasquez is a 35 y.o. 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 Chart review reveals that she was seen by Dr. Avon Gully at the [facility] today, with complaints of her CBGs running "high" for the last 2 wks associated with increased urinary frequency/polyuria, fatigue, intermittent blurry vision, and intermittent generalized abd pain. CBG 511 in the office, HgbA1C >15 in the office. Currently on Lantus 26U QD and Metformin 1052m BID. Per notes from office visit, discussion was had regarding inpatient admission (direct admit) vs ED management vs close outpatient management with insulin adjustments, and pt preferred direct admit since she was concerned about her ability to manage insulin changes at home; no beds available so direct admit cancelled and pt sent to ED for fluids and admission.   Patient is able to acknowledge all of the above as being accurate as far as why she is here today. She describes her abdominal pain is 6/10 intermittent stabbing generalized nonradiating abdominal pain with no known aggravating or alleviating factors given that she has not tried anything for it recently. She states that the blurred vision is intermittent, and denies any loss of vision. Reports that mostly she just feels very tired and fatigued and is frustrated with the polyuria. Hasn't tried anything specific for her symptoms aside from being  compliant with her diabetes medicines. She is on chronic prednisone for anti-synthetase syndrome. She is not on any other diabetes meds aside from those mentioned previously.   She denies any fevers, chills, CP, SOB, N/V/D/C, hematuria, dysuria, vaginal bleeding/discharge, myalgias, arthralgias, numbness, tingling, weakness, lightheadedness, or any other complaints at this time.    The history is provided by the patient and medical records. No language interpreter was used.  Hyperglycemia  Blood sugar level PTA:  511 at PCPs office Severity:  Severe Onset quality:  Gradual Duration:  2 weeks Timing:  Constant Progression:  Unchanged Chronicity:  Recurrent Diabetes status:  Controlled with oral medications and controlled with insulin Current diabetic therapy:  Metformin 10046mBID and Lantus 26U qd Context: not change in medication and not noncompliance   Relieved by:  Nothing Ineffective treatments:  None tried Associated symptoms: abdominal pain (intermittent, generalized ), blurred vision, fatigue and polyuria   Associated symptoms: no chest pain, no confusion, no dizziness, no dysuria, no fever, no nausea, no shortness of breath, no vomiting and no weakness   Risk factors: recent steroid use     Past Medical History:  Diagnosis Date  . Anxiety   . Arthritis    rheumatoid arthritis- mild, no rheumatology care   . Asthma    seasonal allergies   . Asymptomatic LV dysfunction    a. Echo in Dec 2011 with EF 35 to 40%. Felt to be due to paced rhythm. b. EF 25-30% in 07/2014.  . [place] ([place]  . Cardiac pacemaker    a. Since age [age] 1940b. Upgrade to BiV in 2013.  . 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 CHF (congestive heart failure) ([place])   . Congenital complete AV block   . Depression    bipolar  . Diabetes mellitus without complication ([place])   . GERD (gastroesophageal reflux disease)   . Hypertension   . Lupus   . Lupus (systemic lupus  erythematosus) ([place])   . Obesity   . Pneumonitis    a. a/w hypoxia - inflammatory - large workup 07/2014.  . Presence of permanent cardiac pacemaker   . Seizures ([place])    as a child- from high fever  . Sinus tachycardia     Patient Active Problem List   Diagnosis Date Noted  . Breast pain, right 04/14/2016  . Chronic diastolic congestive heart failure ([place])   . Bacteremia 02/24/2016  . Myalgia and myositis   . Type 2 diabetes mellitus with complication, with long-term current use of insulin ([place])   . Anemia of chronic disease   . Bipolar affective disorder in remission ([place])   . Generalized weakness   . Acute blood loss anemia   . Cardiac pacemaker in situ   . Adrenal insufficiency ([place])   . Acute on chronic diastolic CHF (congestive heart failure) ([place])   . Antisynthetase syndrome ([place])   . Hypotension 07/08/2015  . Hyperlipidemia associated with type 2 diabetes mellitus ([place]) 05/04/2015  . Screening for cervical cancer 01/19/2015  . Onychomycosis 12/14/2014  . CHF (congestive heart failure) ([place])   . Chronic respiratory failure with hypoxia ([place]) 08/07/2014  . Insertion of implantable subdermal contraceptive 07/12/2014  . ILD (interstitial lung disease) ([place]) 06/19/2014  . Congestive dilated cardiomyopathy ([place]) 05/31/2014  . Vaginal itching 05/22/2014  . Chronic systolic heart failure ([place]) 02/26/2012  . PPM-[employer]tr[employer]   . Congenital complete AV block   . GERD (gastroesophageal reflux disease)   . THYROGLOSSAL DUCT CYST 07/18/2010  . RH FACTOR, NEGATIVE 04/08/2010  . Mild intermittent asthma without complication 41/96/2229  . POLYCYSTIC OVARY 12/17/2006    Past Surgical History:  Procedure Laterality Date  . ATRIAL TACH ABLATION N/A 08/14/2014   Procedure: ATRIAL TACH ABLATION;  Surgeon: [name], MD;  Location: [facility] CATH LAB;  Service: Cardiovascular;  Laterality: N/A;  . BI-VENTRICULAR PACEMAKER UPGRADE N/A 03/08/2012   Procedure: BI-VENTRICULAR PACEMAKER  UPGRADE;  Surgeon: [name], MD;  Location: [facility] CATH LAB;  Service: Cardiovascular;  Laterality: N/A;  . CARPAL TUNNEL WITH CUBITAL TUNNEL Right 07/26/2013   Procedure: RIGHT LIMITED OPEN CARPAL TUNNEL RELEASE ,  RIGHT CUBITAL TUNNEL RELEASE, INSITU VERSES ULNAR NERVE DECOMPRESSION AND ANTERIOR TRANSPOSITION;  Surgeon: [name], MD;  Location: [facility];  Service: Orthopedics;  Laterality: Right;  . CESAREAN SECTION    . CHOLECYSTECTOMY    . CYST EXCISION  12/10/2012   THYROID  . INSERT / REPLACE / REMOVE PACEMAKER     2001  . IUD REMOVAL  11/03/2011   Procedure: INTRAUTERINE DEVICE (IUD) REMOVAL;  Surgeon: [name], MD;  Location: [facility];  Service: Gynecology;  Laterality: N/A;  . NASAL FRACTURE SURGERY     /w plate   . RIGHT HEART CATHETERIZATION N/A 10/26/2014   Procedure: RIGHT HEART CATH;  Surgeon: [name], MD;  Location: [facility] CATH LAB;  Service: Cardiovascular;  Laterality: N/A;  . THROAT SURGERY  1994   s/p laser treatment  . THYROGLOSSAL DUCT CYST N/A 12/10/2012   Procedure: REVISION OF THYROGLOSSAL DUCT CYST EXCISION;  Surgeon: [name], MD;  Location: [facility];  Service: ENT;  Laterality: N/A;  Revision of Thyroglossal Duct Cyst Excision  . VIDEO BRONCHOSCOPY Bilateral 06/19/2014   Procedure: VIDEO BRONCHOSCOPY WITHOUT FLUORO;  Surgeon: Brand Males, MD;  Location: [facility];  Service: Cardiopulmonary;  Laterality: Bilateral;    OB History    Gravida Para Term Preterm AB Living   1 1 1     1    SAB TAB Ectopic Multiple Live Births                   Home Medications    Prior to Admission medications   Medication Sig Start Date End Date Taking? Authorizing Provider  bisoprolol (ZEBETA) 5 MG tablet Take 0.5 tablets (2.5 mg total) by mouth daily. 04/07/16   [name], DO  Blood Glucose Monitoring Suppl (ONE TOUCH ULTRA 2) w/Device KIT Check blood sugar before meals and at bedtim 12/27/15   [name], PA-C  calcium-vitamin D (OSCAL WITH D)  500-200 MG-UNIT per tablet Take 1 tablet by mouth daily with breakfast. Reported on 04/15/2016    Historical Provider, MD  camphor-menthol Timoteo Ace) lotion Apply topically 2 (two) times daily. 12/26/15   [name], PA-C  fluocinonide (LIDEX) 0.05 % external solution 1 APPLICATION APPLY ON THE SKIN AS DIRECTED APPLY TO AFFECTED AREAS DAILY ON SCALP AS NEEDED FOR ITCHING. 09/28/15   Historical Provider, MD  fluticasone (FLONASE) 50 MCG/ACT nasal spray Place 2 sprays into both nostrils daily. 07/24/16   Vivi Barrack, MD  furosemide (LASIX) 20 MG tablet Take 1 tablet (20 mg total) by mouth every other day. 05/14/16   [name], MD  furosemide (LASIX) 20 MG tablet TAKE 1 TABLET BY MOUTH EVERY DAY 05/22/16   Verner Mould, MD  glucose blood (ONE TOUCH ULTRA TEST) test strip Check blood sugar daily before breakfast 10/25/14   Leone Brand, MD  Insulin Glargine (LANTUS SOLOSTAR) 100 UNIT/ML Solostar Pen Inject 15 Units into the skin daily. 04/07/16   [name], DO  loratadine (CLARITIN) 10 MG tablet Take 1 tablet (10 mg total) by mouth daily. Patient not taking: Reported on 08/15/2016 04/07/16   [name], DO  losartan (COZAAR) 25 MG tablet Take 0.5 tablets (12.5 mg total) by mouth daily. 04/07/16   [name], DO  metFORMIN (GLUCOPHAGE) 1000 MG tablet TAKE 1 TABLET BY MOUTH TWICE A DAY WITH A MEAL 04/07/16   [name], DO  mycophenolate (CELLCEPT) 500 MG tablet Take 750 mg by mouth 2 (two) times daily.    Historical Provider, MD  NONFORMULARY OR COMPOUNDED [place]:  Onychomycosis Nail Lacquer - Fluconazole 2%, Terbinafine 1%, apply to affected area daily. 06/04/16   Trula Slade, DPM  oxyCODONE-acetaminophen (PERCOCET/ROXICET) 5-325 MG tablet Take 1 tablet by mouth every 6 (six) hours as needed for severe pain. 05/14/16   Verner Mould, MD  oxymetazoline (AFRIN) 0.05 % nasal spray Place 1 spray into both nostrils 2  (two) times daily. DO NOT USE for more than 3 days. 07/24/16   Vivi Barrack, MD  pantoprazole (PROTONIX) 40 MG tablet Take 1 tablet (40 mg total) by mouth daily. 04/07/16   [name], DO  polyethylene glycol (MIRALAX / GLYCOLAX) packet Take 17 g by mouth daily after supper. Patient taking differently: Take 17 g by mouth daily as needed for mild constipation.  12/26/15   [name], PA-C  predniSONE (DELTASONE) 20 MG tablet Take 40 mg by mouth daily with breakfast.  Historical Provider, MD  senna-docusate (SENOKOT-S) 8.6-50 MG tablet Take 3 tablets by mouth 2 (two) times daily as needed for mild constipation. 04/07/16   [name], DO    Family History Family History  Problem Relation Age of Onset  . Heart disease Mother     CHF (no details)  . Hypertension Mother   . Lupus Mother   . Heart disease Father     Murmur  . Heart disease Sister 72     No details.  History of a pacemaker    Social History Social History  Substance Use Topics  . Smoking status: Former Smoker    Packs/day: 0.25    Years: 0.50    Types: Cigarettes    Quit date: 07/25/1996  . Smokeless tobacco: Never Used  . Alcohol use No     Allergies   Atorvastatin; Sertraline hcl; and Tape   Review of Systems Review of Systems  Constitutional: Positive for fatigue. Negative for chills and fever.  Eyes: Positive for blurred vision and visual disturbance (intermittent blurred vision).  Respiratory: Negative for shortness of breath.   Cardiovascular: Negative for chest pain.  Gastrointestinal: Positive for abdominal pain (intermittent, generalized ). Negative for constipation, diarrhea, nausea and vomiting.  Endocrine: Positive for polyuria.  Genitourinary: Negative for dysuria, hematuria, vaginal bleeding and vaginal discharge.  Musculoskeletal: Negative for arthralgias and myalgias.  Skin: Negative for color change.  Allergic/Immunologic: Positive for immunocompromised state (DM2).   Neurological: Negative for dizziness, weakness, light-headedness and numbness.  Psychiatric/Behavioral: Negative for confusion.   10 Systems reviewed and are negative for acute change except as noted in the HPI.   Physical Exam Updated Vital Signs BP 123/78   Pulse 100   Temp 97.6 F (36.4 C) (Oral)   Resp 22   SpO2 100%   Physical Exam  Constitutional: She is oriented to person, place, and time. Vital signs are normal. She appears well-developed and well-nourished.  Non-toxic appearance. No distress.  Afebrile, nontoxic, NAD, appears older than stated age, on home O2  HENT:  Head: Normocephalic and atraumatic.  Mouth/Throat: Oropharynx is clear and moist and mucous membranes are normal.  Eyes: Conjunctivae and EOM are normal. Right eye exhibits no discharge. Left eye exhibits no discharge.  Neck: Normal range of motion. Neck supple.  Cardiovascular: Normal rate, regular rhythm, normal heart sounds and intact distal pulses.  Exam reveals no gallop and no friction rub.   No murmur heard. Pulmonary/Chest: Effort normal and breath sounds normal. No respiratory distress. She has no decreased breath sounds. She has no wheezes. She has no rhonchi. She has no rales.  Abdominal: Soft. Normal appearance and bowel sounds are normal. She exhibits no distension. There is no tenderness. There is no rigidity, no rebound, no guarding, no CVA tenderness, no tenderness at McBurney's point and negative Murphy's sign.  Soft, nondistended, +BS throughout, with no focal areas of tenderness, no r/g/r, neg murphy's, neg mcburney's, no CVA TTP   Musculoskeletal: Normal range of motion.  MAE x4 Strength and sensation grossly intact in all extremities Distal pulses intact Gait steady  Neurological: She is alert and oriented to person, place, and time. She has normal strength. No sensory deficit.  Skin: Skin is warm, dry and intact. No rash noted.  Psychiatric: She has a normal mood and affect.  Nursing  note and vitals reviewed.    ED Treatments / Results  Labs (all labs ordered are listed, but only abnormal results are displayed) Labs Reviewed  BASIC  METABOLIC PANEL - Abnormal; Notable for the following:       Result Value   Sodium 134 (*)    CO2 20 (*)    Glucose, Bld 586 (*)    All other components within normal limits  URINALYSIS, ROUTINE W REFLEX MICROSCOPIC - Abnormal; Notable for the following:    Color, Urine STRAW (*)    Glucose, UA >=500 (*)    Ketones, ur 20 (*)    Squamous Epithelial / LPF 0-5 (*)    All other components within normal limits  CBG MONITORING, ED - Abnormal; Notable for the following:    Glucose-Capillary 557 (*)    All other components within normal limits  CBC  MAGNESIUM  PHOSPHORUS    EKG  EKG Interpretation None       Radiology No results found.  Procedures Procedures (including critical care time)  Medications Ordered in ED Medications  sodium chloride 0.9 % bolus 500 mL (not administered)     Initial Impression / Assessment and Plan / ED Course  I have reviewed the triage vital signs and the nursing notes.  Pertinent labs & imaging results that were available during my care of the patient were reviewed by me and considered in my medical decision making (see chart for details).  Clinical Course     35 y.o. female here with hyperglycemia x2 wks with associated generalized intermittent abd pain, intermittent blurred vision, urine freq, and fatigue. Sent here for admission by PCP. On exam, no focal abdominal tenderness, nonperitoneal, VSS. CBG 557 here, U/A with +gluc, few ketones, nitrite and leuk neg, 0-5 squamous and WBC/RBCs, doubt UTI given this. BMP still in process, will await values of this to see if she's truly in DKA vs just having few ketones in urine but not truly acidotic; will hold off on starting glucostabilizer just yet. Will add-on iStat ABG, and Mg and Phos levels. Pt with CHF and multiple other cardiopulmonary and  medical comorbids, so want to be gentle with fluids, will give 56m bolus but would opt to not give much more than that for now. Will consult family medicine for admission.   6:50 PM BMP with Na 134 which corrects; bicarb very slightly low at 20, no anion gap, and glucose 586; doesn't appear she's in frank DKA, but could be headed that way. Awaiting return page from family medicine service, and remainder of work up, so will continue to hold on starting gFlorencefor now, or other insulins. Will reassess shortly.   6:56 PM Dr. DToya [facility] returning page and will admit. Holding orders placed. Of note, Mg, Phos, and ABG still not yet done, and advised admitting team that these are ordered and pending. Please see their notes for further documentation of care. I appreciate their help with this pleasant pt's care. Pt stable at time of admission.   Final Clinical Impressions(s) / ED Diagnoses   Final diagnoses:  Type 2 diabetes mellitus with hyperglycemia, with long-term current use of insulin (HCC)  Ketonuria  Polyuria  Intermittent abdominal pain  Fatigue, unspecified type    New Prescriptions New Prescriptions   No medications on file     MZacarias Pontes PA-C 09/23/16 [place] MD 09/25/16 [phone]

## 2016-09-23 NOTE — Progress Notes (Signed)
CALL PAGER [phone] for any questions or notifications regarding this patient  FMTS Attending Note: Denny Levy MD  Please see full H&P for details. Ms. Spada is very complicated clinically. She is admitted after presenting to [facility] this afternoon with chief complaint of increasing urinary frequency. Found to have CBG > 500.Attemoted direct admit but due to lack of bed availability she was sent through ED. I have discussed her case with Dr. Wende Mott, and I have reviewed her chart. She was seen and examined in clinic this afternoon by Dr. Deirdre Priest and Dr. Natale Milch. Dr Deirdre Priest discussed her with me prior to sending her to the ED.  1. DM with Hgb A1C. 15. Hyperglycemic with CBG > 500, no increase in her anion gap which is 10.  CO2 is down a small amount (20 ) and potassium is normal at 4.6. We will use glucomander to see if we can get her CBG down to a more reasonable level. Monitor potassium. 2. Complex CV history so we will need to be cautious with her fluids. We agreed to start her at 1 1/2 maintenance with NS and continue her lasix. It seems she takes her home lasix QOD so for now we will plan to at least do QD and will likely need additional doses for diuresis.Will check Magnesium level, EKG is ordered, cycle at least one set of troponins. Given her pacer it is hard to know if her tachycardia is related to relative intravascular depletion related to hyperglycemia, or if this is her baseline.  Her cardiac history includes: A... chronic systolic heart failure due to NICM EF 25%, 2. B.. congenital high-grade heart block (mother has SLE) s/p CRT-P.  She  underwent first PPM at age 38. In 2011 had ECHO with EF 35-40%. LV dysfunction felt to be due to RV pacing so upgraded to CRT-P. Echo in 10/15 with EF 25%.: ECHO 03/2016 EF50-55%.  3. PULMONARY:autoimmne lung disease followed by Dr. Maple Hudson.  She is a former smoker. Her PFT in 2015 showed: Severe restriction and decrease in DLCO  Dx interstitial lung  disease, CT showed severe bilateral mid and lower lung  ground glass opacities c/w ILD or autoimmune lung disease. 4.RHEUMATOLOGY: Antisynthetase syndrome followed at [facility] on cellcept, prednisone and recent treatment with Rituximab.

## 2016-09-23 NOTE — Progress Notes (Signed)
   Subjective:   Patient: Makayla Vasquez       Birthdate: [birth year]       MRN: [id number]      HPI  Makayla Vasquez is a 35 y.o. female presenting for same day appointment for high blood sugar.   Patient with history of poorly controlled Type II DM. She reports that for the past two weeks, her blood sugar has read "HIGH" every time she measures it. Patient also reports greatly increased urinary frequency, blurry vision, abdominal pain, and somnolence. She is taking metformin 1000mg  BID as well as Lantus 26U Lantus qd. She is not on any sliding scale or mealtime coverage insulin.   Of note, patient has anti-synthetase syndrome, for which she is on prednisone 17.5mg  qd prescribed by her rheumatologist. Patient is also only O2 for interstitial lung disease.   Smoking status reviewed.   Review of Systems See HPI.     Objective:  Physical Exam  Constitutional: She is oriented to person, place, and time.  Sitting on bed in NAD  HENT:  Slightly dry mucous membranes  Eyes: Conjunctivae and EOM are normal. Right eye exhibits no discharge. Left eye exhibits no discharge.  Cardiovascular: Regular rhythm.   Tachycardic  Pulmonary/Chest: Effort normal. No respiratory distress.  Shiner in place  Neurological: She is alert and oriented to person, place, and time.  Skin: Skin is warm and dry.  Psychiatric: Affect and judgment normal.      Assessment & Plan:  Type 2 diabetes mellitus with complication, with long-term current use of insulin (HCC) CBG 511 in office today. Patient's tachycardic to 123, and reporting abdominal pain, blurred vision, increased urinary frequency, and somnolence. A1C >15. Spoke with patient regarding inpatient admission vs going to ED vs outpatient management with adjustments in insulin and close follow-up. Patient concerned about managing hyperglycemia and making insulin adjustments at home, and prefers to be directly admitted. Hospital called for direct admit, however no beds  available, so patient was sent directly to ED.  - Sent to ED for hyperglycemia  - Anticipate patient will need much higher dose of insulin long-term (currently only on 26U Lantus qd and metformin 1000mg  BID). Patient will also need to be strongly encouraged to closely follow up in clinic, including appointments with pharmacist Dr. for better DM control   , MD, MPH PGY-2 [facility] Pager [phone]

## 2016-09-23 NOTE — ED Notes (Signed)
PA made aware of critical glucose of 586

## 2016-09-24 ENCOUNTER — Inpatient Hospital Stay (HOSPITAL_COMMUNITY): Payer: Medicare Other

## 2016-09-24 ENCOUNTER — Encounter (HOSPITAL_COMMUNITY): Payer: Self-pay | Admitting: *Deleted

## 2016-09-24 LAB — CBC
HCT: 35.3 % — ABNORMAL LOW (ref 36.0–46.0)
HEMATOCRIT: 36.5 % (ref 36.0–46.0)
Hemoglobin: 11.5 g/dL — ABNORMAL LOW (ref 12.0–15.0)
Hemoglobin: 11.1 g/dL — ABNORMAL LOW (ref 12.0–15.0)
MCH: 27.5 pg (ref 26.0–34.0)
MCH: 27.8 pg (ref 26.0–34.0)
MCHC: 31.5 g/dL (ref 30.0–36.0)
MCHC: 31.4 g/dL (ref 30.0–36.0)
MCV: 87.3 fL (ref 78.0–100.0)
MCV: 88.5 fL (ref 78.0–100.0)
PLATELETS: 237 10*3/uL (ref 150–400)
PLATELETS: 229 10*3/uL (ref 150–400)
RBC: 4.18 MIL/uL (ref 3.87–5.11)
RBC: 3.99 MIL/uL (ref 3.87–5.11)
RDW: 15.4 % (ref 11.5–15.5)
RDW: 15.4 % (ref 11.5–15.5)
WBC: 6.9 10*3/uL (ref 4.0–10.5)
WBC: 5.2 10*3/uL (ref 4.0–10.5)

## 2016-09-24 LAB — MRSA PCR SCREENING: MRSA by PCR: NEGATIVE

## 2016-09-24 LAB — BASIC METABOLIC PANEL
ANION GAP: 8 (ref 5–15)
ANION GAP: 8 (ref 5–15)
Anion gap: 10 (ref 5–15)
BUN: 8 mg/dL (ref 6–20)
BUN: 7 mg/dL (ref 6–20)
BUN: 11 mg/dL (ref 6–20)
CALCIUM: 8.9 mg/dL (ref 8.9–10.3)
CHLORIDE: 107 mmol/L (ref 101–111)
CO2: 22 mmol/L (ref 22–32)
CO2: 22 mmol/L (ref 22–32)
CO2: 20 mmol/L — AB (ref 22–32)
CREATININE: 0.41 mg/dL — AB (ref 0.44–1.00)
Calcium: 8.7 mg/dL — ABNORMAL LOW (ref 8.9–10.3)
Calcium: 8.6 mg/dL — ABNORMAL LOW (ref 8.9–10.3)
Chloride: 107 mmol/L (ref 101–111)
Chloride: 106 mmol/L (ref 101–111)
Creatinine, Ser: 0.46 mg/dL (ref 0.44–1.00)
Creatinine, Ser: 0.58 mg/dL (ref 0.44–1.00)
GFR calc Af Amer: >60 mL/min (ref >60)
GFR calc Af Amer: >60 mL/min (ref >60)
GFR calc non Af Amer: >60 mL/min (ref >60)
GFR calc non Af Amer: >60 mL/min (ref >60)
GLUCOSE: 99 mg/dL (ref 65–99)
GLUCOSE: 223 mg/dL — AB (ref 65–99)
GLUCOSE: 451 mg/dL — AB (ref 65–99)
POTASSIUM: 3.5 mmol/L (ref 3.5–5.1)
POTASSIUM: 4.3 mmol/L (ref 3.5–5.1)
Potassium: 3.3 mmol/L — ABNORMAL LOW (ref 3.5–5.1)
Sodium: 139 mmol/L (ref 135–145)
Sodium: 137 mmol/L (ref 135–145)
Sodium: 134 mmol/L — ABNORMAL LOW (ref 135–145)

## 2016-09-24 LAB — GLUCOSE, CAPILLARY
GLUCOSE-CAPILLARY: 138 mg/dL — AB (ref 65–99)
Glucose-Capillary: 233 mg/dL — ABNORMAL HIGH (ref 65–99)
Glucose-Capillary: 91 mg/dL (ref 65–99)
Glucose-Capillary: 127 mg/dL — ABNORMAL HIGH (ref 65–99)
Glucose-Capillary: 170 mg/dL — ABNORMAL HIGH (ref 65–99)
Glucose-Capillary: 211 mg/dL — ABNORMAL HIGH (ref 65–99)
Glucose-Capillary: 210 mg/dL — ABNORMAL HIGH (ref 65–99)
Glucose-Capillary: 182 mg/dL — ABNORMAL HIGH (ref 65–99)
Glucose-Capillary: 110 mg/dL — ABNORMAL HIGH (ref 65–99)
Glucose-Capillary: 164 mg/dL — ABNORMAL HIGH (ref 65–99)
Glucose-Capillary: 446 mg/dL — ABNORMAL HIGH (ref 65–99)
Glucose-Capillary: 396 mg/dL — ABNORMAL HIGH (ref 65–99)
Glucose-Capillary: 227 mg/dL — ABNORMAL HIGH (ref 65–99)

## 2016-09-24 LAB — COMPREHENSIVE METABOLIC PANEL
ALBUMIN: 3 g/dL — AB (ref 3.5–5.0)
ALT: 16 U/L (ref 14–54)
ANION GAP: 11 (ref 5–15)
AST: 17 U/L (ref 15–41)
Alkaline Phosphatase: 71 U/L (ref 38–126)
BILIRUBIN TOTAL: 0.7 mg/dL (ref 0.3–1.2)
BUN: 10 mg/dL (ref 6–20)
CO2: 21 mmol/L — ABNORMAL LOW (ref 22–32)
Calcium: 8.9 mg/dL (ref 8.9–10.3)
Chloride: 105 mmol/L (ref 101–111)
Creatinine, Ser: 0.61 mg/dL (ref 0.44–1.00)
Glucose, Bld: 337 mg/dL — ABNORMAL HIGH (ref 65–99)
POTASSIUM: 3.6 mmol/L (ref 3.5–5.1)
Sodium: 137 mmol/L (ref 135–145)
TOTAL PROTEIN: 6.8 g/dL (ref 6.5–8.1)

## 2016-09-24 LAB — CBG MONITORING, ED: Glucose-Capillary: 243 mg/dL — ABNORMAL HIGH (ref 65–99)

## 2016-09-24 LAB — TROPONIN I

## 2016-09-24 LAB — LIPASE, BLOOD: Lipase: 22 U/L (ref 11–51)

## 2016-09-24 LAB — TSH: TSH: 1.071 u[IU]/mL (ref 0.350–4.500)

## 2016-09-24 MED ORDER — MYCOPHENOLATE MOFETIL 250 MG PO CAPS
750.0000 mg | ORAL_CAPSULE | Freq: Two times a day (BID) | ORAL | Status: DC
  Administered 2016-09-24 – 2016-09-27 (×7): 750 mg via ORAL
  Filled 2016-09-24 (×10): qty 3

## 2016-09-24 MED ORDER — INSULIN ASPART 100 UNIT/ML ~~LOC~~ SOLN
0.0000 [IU] | Freq: Three times a day (TID) | SUBCUTANEOUS | Status: DC
  Administered 2016-09-24: 3 [IU] via SUBCUTANEOUS
  Administered 2016-09-24: 15 [IU] via SUBCUTANEOUS
  Administered 2016-09-25 (×3): 11 [IU] via SUBCUTANEOUS

## 2016-09-24 MED ORDER — INSULIN GLARGINE 100 UNIT/ML ~~LOC~~ SOLN
18.0000 [IU] | Freq: Every day | SUBCUTANEOUS | Status: DC
  Administered 2016-09-24: 18 [IU] via SUBCUTANEOUS
  Filled 2016-09-24 (×2): qty 0.18

## 2016-09-24 MED ORDER — POTASSIUM CHLORIDE CRYS ER 20 MEQ PO TBCR
40.0000 meq | EXTENDED_RELEASE_TABLET | Freq: Two times a day (BID) | ORAL | Status: AC
  Administered 2016-09-24 (×2): 40 meq via ORAL
  Filled 2016-09-24 (×2): qty 2

## 2016-09-24 MED ORDER — LIVING WELL WITH DIABETES BOOK
Freq: Once | Status: AC
  Administered 2016-09-24: 15:00:00
  Filled 2016-09-24 (×2): qty 1

## 2016-09-24 MED ORDER — PREDNISONE 10 MG PO TABS
17.5000 mg | ORAL_TABLET | Freq: Every day | ORAL | Status: DC
  Administered 2016-09-24 – 2016-09-27 (×4): 17.5 mg via ORAL
  Filled 2016-09-24 (×2): qty 1
  Filled 2016-09-24: qty 2
  Filled 2016-09-24: qty 1
  Filled 2016-09-24: qty 2

## 2016-09-24 MED ORDER — SODIUM CHLORIDE 0.9 % IV SOLN
INTRAVENOUS | Status: DC
  Administered 2016-09-24: 10:00:00 via INTRAVENOUS

## 2016-09-24 NOTE — ED Notes (Signed)
Patient transported to X-ray 

## 2016-09-24 NOTE — Discharge Summary (Signed)
[facility] Discharge Summary  Patient name: Makayla Vasquez Medical record number: [id number] Date of birth: 1981/03/19 Age: 35 y.o. Gender: female Date of Admission: 09/23/2016  Date of Discharge: 09/27/2016 Admitting Physician: Dickie La, MD  Primary Care Provider: Adin Hector, MD Consultants: None  Indication for Hospitalization: Hyperglycemia  Discharge Diagnoses/Problem List:  Active Problems:   Hyperglycemia   History of heart failure   Long term (current) use of systemic steroids    Disposition: Discharge home  Discharge Condition: Stable, improved  Discharge Exam:  General: 35yo F sitting up in bed appearing comfortable with [place] in place ENTM: MMM Cardiovascular: RRR, no murmurs, palpable pulses Respiratory: NWOB, CTABL, no wheezing or rhonchi Gastrointestinal: soft, mild diffuse tenderness without guarding and no peritoneal signs present MSK: no deformities, able to move all extremities Derm: warm and dry, no rashes noted Neuro: AAOx3 Psych: Normal mood and affect  Brief Hospital Course:  Patient presented to clinic and was found to have CBG greater than 500 with a chief complaint of increasing urinary frequency. She was directly admitted to [facility] for treatment of hyperglycemia.  Her A1c was found to be greater than 15 and her sugars were 586 in the emergency department.  She had no increase in anion gap which was 10 and her CO2 was down a small amount 20 and potassium was normal at 4.6.  Given her history of heart failure she was started on 1.5 maintenance IV fluids and also started on insulin drip overnight.  Her magnesium was normal as well as EKG and she had negative troponin. Her sugars managed to come down to appropriate levels the following morning and she was started on 2/3 of her home dose of Lantus as well as a moderate sliding-scale insulin and was started on a carb modified diet. The next morning her sugar was 340 and  she required 15 units NovoLog over the previous day and her Lantus was increased to 25 units and she was also started on 5 units meal coverage in addition to her moderate insulin sliding scale.  Her sugars remained elevated the following day and her Lantus was increased to 30 units daily and it was increased to 8 units daily and continued with a moderate sliding scale. At same night moderate sliding scale was increased to resistance sliding scale.  On the morning of discharge her morning sugar was 293 and upon chart review patient appeared to have not received her 30 units Lantus the previous day.  Safety zone report was charted.  Patient was able to repeat back her current regimen and was discharged with plan to take 30 units Lantus daily with solostar pen and 8 units NovoLog with meals 3 times daily using Flexpen.  Additionally her blood pressures remained low throughout admission and her blood pressure medications were held.  I've advised patient to continue holding with these medications until she follows up with physician at family [place] on 09/29/2016.   Issues for Follow Up:  1. Hyperglycemia: Patient with A1c greater than 15 presented to [facility] for hyperglycemia. Sugars remained largely in the mid 200s to 300s during admission. Plan was to continue patient with 30 units Lantus and NovoLog 8 units with meals 3 times a day.  Patient was unfortunately not administered Lantus the day prior to discharge.  Please discuss insulin regimen with patient and get a feel for whether or not she understands signs and symptoms of hypoglycemia/hyperglycemia. 2. Hypertension: Patient with a history  of hypertension taking bisoprolol 2.5 mg daily and Cozaar. Patient had soft pressures throughout admission averaging mid 90s over mid 15s.  Blood pressures were held for the majority of admission, and recommended the patient continue holding medication until visit.   Significant Procedures: none  Significant Labs and  Imaging:   Recent Labs Lab 09/23/16 1724 09/23/16 2228 09/24/16 0448  WBC 7.3 6.9 5.2  HGB 13.0 11.5* 11.1*  HCT 40.2 36.5 35.3*  PLT 273 237 229    Recent Labs Lab 09/23/16 2105 09/23/16 2228  09/24/16 0930 09/24/16 1643 09/25/16 0520 09/25/16 1636 09/26/16 1033  NA  --  137  < > 137 134* 136 135 136  K  --  3.6  < > 3.5 4.3 3.6 3.9 3.2*  CL  --  105  < > 107 106 106 104 103  CO2  --  21*  < > 22 20* 22 22 24   GLUCOSE  --  337*  < > 223* 451* 340* 248* 327*  BUN  --  10  < > 7 11 11 8 12   CREATININE  --  0.61  < > 0.46 0.58 0.53 0.68 0.70  CALCIUM  --  8.9  < > 8.7* 8.6* 8.8* 8.4* 8.5*  MG 1.7  --   --   --   --   --   --   --   PHOS 3.5  --   --   --   --   --   --   --   ALKPHOS  --  71  --   --   --   --   --   --   AST  --  17  --   --   --   --   --   --   ALT  --  16  --   --   --   --   --   --   ALBUMIN  --  3.0*  --   --   --   --   --   --   < > = values in this interval not displayed.   Results/Tests Pending at Time of Discharge: None  Discharge Medications:    Medication List    STOP taking these medications   bisoprolol 5 MG tablet Commonly known as:  ZEBETA   losartan 25 MG tablet Commonly known as:  COZAAR   metFORMIN 1000 MG tablet Commonly known as:  GLUCOPHAGE   oxymetazoline 0.05 % nasal spray Commonly known as:  AFRIN     TAKE these medications   calcium-vitamin D 500-200 MG-UNIT tablet Commonly known as:  OSCAL WITH D Take 1 tablet by mouth daily with breakfast. Reported on 04/15/2016   camphor-menthol lotion Commonly known as:  SARNA Apply topically 2 (two) times daily.   fluocinonide 0.05 % external solution Commonly known as:  LIDEX 1 APPLICATION APPLY ON THE SKIN AS DIRECTED APPLY TO AFFECTED AREAS DAILY ON SCALP AS NEEDED FOR ITCHING.   fluticasone 50 MCG/ACT nasal spray Commonly known as:  FLONASE Place 2 sprays into both nostrils daily.   furosemide 20 MG tablet Commonly known as:  LASIX Take 1 tablet (20 mg  total) by mouth every other day. What changed:  Another medication with the same name was removed. Continue taking this medication, and follow the directions you see here.   insulin aspart 100 UNIT/ML FlexPen Commonly known as:  NOVOLOG Inject 8 Units into the skin 3 (three)  times daily with meals.   Insulin Glargine 100 UNIT/ML Solostar Pen Commonly known as:  LANTUS SOLOSTAR Inject 30 Units into the skin daily. What changed:  how much to take   loratadine 10 MG tablet Commonly known as:  CLARITIN Take 1 tablet (10 mg total) by mouth daily.   mycophenolate 500 MG tablet Commonly known as:  CELLCEPT Take 750 mg by mouth 2 (two) times daily.   NONFORMULARY OR COMPOUNDED ITEM [facility]:  Onychomycosis Nail Lacquer - Fluconazole 2%, Terbinafine 1%, apply to affected area daily.   ONE TOUCH ULTRA 2 w/Device Kit Check blood sugar before meals and at bedtim   oxyCODONE-acetaminophen 5-325 MG tablet Commonly known as:  PERCOCET/ROXICET Take 1 tablet by mouth every 6 (six) hours as needed for severe pain.   pantoprazole 40 MG tablet Commonly known as:  PROTONIX Take 1 tablet (40 mg total) by mouth daily.   polyethylene glycol packet Commonly known as:  MIRALAX / GLYCOLAX Take 17 g by mouth daily after supper. What changed:  when to take this  reasons to take this   predniSONE 5 MG tablet Commonly known as:  DELTASONE Take 17.5 mg by mouth daily with breakfast.   senna-docusate 8.6-50 MG tablet Commonly known as:  Senokot-S Take 3 tablets by mouth 2 (two) times daily as needed for mild constipation.   Vitamin D (Ergocalciferol) 50000 units Caps capsule Commonly known as:  DRISDOL Take 50,000 Units by mouth every 7 (seven) days.       Discharge Instructions: Please refer to Patient Instructions section of EMR for full details.  Patient was counseled important signs and symptoms that should prompt return to medical care, changes in medications, dietary  instructions, activity restrictions, and follow up appointments.   Follow-Up Appointments:   Eloise Levels, MD 09/27/2016, 3:17 PM PGY-1, [place]

## 2016-09-24 NOTE — Progress Notes (Signed)
Patients CBG was 446. Dr. Cathlean Cower notified new orders received.

## 2016-09-24 NOTE — Plan of Care (Signed)
Problem: Nutritional: Goal: Maintenance of adequate nutrition will improve Outcome: Progressing Discussed with patient NPO status due to insulin drip with teach back displayed

## 2016-09-24 NOTE — Progress Notes (Signed)
Inpatient Diabetes Program Recommendations  AACE/ADA: New Consensus Statement on Inpatient Glycemic Control (2015)  Target Ranges:  Prepandial:   less than 140 mg/dL      Peak postprandial:   less than 180 mg/dL (1-2 hours)      Critically ill patients:  140 - 180 mg/dL   Lab Results  Component Value Date   GLUCAP 210 (H) 09/24/2016   HGBA1C >15.0 09/23/2016    Review of Glycemic Control Results for Makayla Vasquez, Makayla Vasquez (MRN [id number]) as of 09/24/2016 10:10  Ref. Range 09/24/2016 05:45 09/24/2016 06:56 09/24/2016 08:06 09/24/2016 09:10  Glucose-Capillary Latest Ref Range: 65 - 99 mg/dL 794 (H) 801 (H) 655 (H) 210 (H)   Diabetes history: DM2 Outpatient Diabetes medications: Lantus 26 units (clarified with patient) + Metformin 1 gm bid Current orders for Inpatient glycemic control: Lantus 18 + Novolog correction 0-15 tid  Inpatient Diabetes Program Recommendations:    Spoke with RN Selena Batten and patient to receive Lantus 2 hrs and cover CBG when IV insulin drip discontinued. Patient may also need Novolog meal coverage 3-4 units tid if eats 50% and titration in Lantus to keep CBGs< 180. Spoke with patient @ bedside. Patient states "I forget my insulin sometimes". Reviewed with patient importance of medication compliance along with reviewed A1c with risks of elevated CBGs. Patient states she notices her CBGs elevated post meals but has not been taking log of CBGs to her physician office visits. Reviewed importance of taking log of CBGs or meter to office visits for review for medication adjustments. Ordered Living Well With Diabetes and nurses, please assist patient with education videos.  Thank you, Billy Fischer. Dashun Borre, RN, MSN, CDE Inpatient Glycemic Control Team Team Pager [phone] (8am-5pm) 09/24/2016 10:18 AM

## 2016-09-24 NOTE — Progress Notes (Signed)
Family Medicine Teaching Service Daily Progress Note Intern Pager: [phone]  Patient name: Makayla Vasquez Medical record number: [id number] Date of birth: Jul 15, 1981 Age: 35 y.o. Gender: female  Primary Care Provider: Tarri Abernethy, MD Consultants: None Code Status: Full code  Pt Overview and Major Events to Date:  1. Admit to FMTS strart insulin drip 2. Stop insulin drip  Assessment and Plan: Type 2 diabetic with severe hyperglycemia:  No evidence of DKA, suspicious for HHS however hyperglycemia would be on the lower range of what is typically expected, and patient does not appear overly dehydrated. Because of this would not fully classify patient within the "HHS" category. CBG in clinic to 511 up to 586 in ED.  Bicarb 22, anion gap of 10, normal mg2+ and phosphorus. UA showed >20 ketones.  Endorses increased urination for past three weeks and occasional blurry vision, as well as diffuse abdominal pain.  Takes 26U lantus and metformin 1000mg  BID at home. Hgb A1C today was >15.  Given history of heart failure, plan to gently hydrate patient with 1.5 MIVF and attempt to bring down glucose with glucomander.  K+ wnl at 4.6.  Able to transition off of insulin drip this morning.  Start on Lantus 2/3 home dose at 18U and mSSI - closely monitor K+ >> preemptively replete 40 mEq K+ - lantus 18U and mSSI - gently hydrate @MIVF   - ABG normal  Abdominal pain: Diffuse abdominal pain without peritoneal signs or guarding.  No leukocytosis or systemic signs. Last BM was two days ago.  Denies NVD or sick contacts. Lipase and LFT and KUB negative. 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 on 05/15/16.  Last echo was 8/17 and showed 50-55% EF and normal systolic and diastolic function of the LV.  Does not appear fluid overloaded on exam, no peripheral  edema, no bibasilar lung crackles. No reports of pressure-like chest pain or worsening shortness of breath. Troponin <0.03.  - Strict I/O-- no I/O recorded - daily weights - Continue home lasix 20mg  Oral   Congenital complete AV block, with pacemaker in situ: Pacemaker in place. No issues of late.  HR 79 this morning.  - Monitor   Anti-synthetase syndrome with ILD:  Followed by rhematologist at [facility].  Takes 17.5mg  daily prednisone and requies 2-4L O2 at home.  Currently on 3L [place] satting 100%.  Receiving chemotherapy at [facility] and her last dose was reportedly in [place].  - Continue prednisone - continue cellcept - continue home O2 - albuterol PRN  HTN:  Takes coreg 12.5mg  daily and zebeta 2.5 mg daily.  95/71 this AM.  - holding coreg for morning  Bipolar affective disorder: On no medications for this at this time. - Monitor   FEN/GI: NPO/1.5 MIVF Prophylaxis: lovenox  Disposition: discharge home when stable  Subjective:  Patient feels well this morning, but has a HA.  Abdominal pain improved.  Did not have any BMs. Denies NVD or CP.   Objective: Temp:  [97.5 F (36.4 C)-98 F (36.7 C)] 97.5 F (36.4 C) (12/06 0158) Pulse Rate:  [74-123] 78 (12/06 0600) Resp:  [18-27] 26 (12/06 0600) BP: (96-129)/(66-92) 102/66 (12/06 0600) SpO2:  [85 %-100 %] 100 % (12/06 0600) Weight:  [142 lb 6.7 oz (64.6 kg)-144 lb (65.3 kg)] 142 lb 6.7 oz (64.6 kg) (12/06 0158) Physical Exam: General: 35yo F sitting  up in bed appearing comfortable with [place] in place Eyes: non-injected, EOMI, PERRL ENTM: MMM, clear oropharynx Neck: supple, no LAD Cardiovascular: RRR, no murmurs, palpable pulses Respiratory: NWOB, CTABL, no wheezing or rhonchi Gastrointestinal: soft, mild diffuse tenderness without guarding and no peritoneal signs present MSK: no deformities, able to move all extremities Derm: warm and dry, no rashes noted Neuro: AAOx3 Psych: Normal mood and affect  Laboratory:  Recent  Labs Lab 09/23/16 1724 09/23/16 2228 09/24/16 0448  WBC 7.3 6.9 5.2  HGB 13.0 11.5* 11.1*  HCT 40.2 36.5 35.3*  PLT 273 237 229    Recent Labs Lab 09/23/16 1724 09/23/16 2228 09/24/16 0448  NA 134* 137 139  K 4.6 3.6 3.3*  CL 104 105 107  CO2 20* 21* 22  BUN 12 10 8   CREATININE 0.78 0.61 0.41*  CALCIUM 9.2 8.9 8.9  PROT  --  6.8  --   BILITOT  --  0.7  --   ALKPHOS  --  71  --   ALT  --  16  --   AST  --  17  --   GLUCOSE 586* 337* 99    Imaging/Diagnostic Tests: Dg Chest 2 View  Result Date: 09/23/2016 CLINICAL DATA:  Hyperglycemia for 2 weeks. History of diabetes and heart failure. EXAM: CHEST  2 VIEW COMPARISON:  Chest radiograph Feb 21, 2016 and chest CT Feb 22, 2016 FINDINGS: Cardiomediastinal silhouette is normal. Coarsened pulmonary interstitium with faint alveolar airspace opacities. No pleural effusion. No pneumothorax. LEFT cardiac pacemaker in situ. Soft tissue planes and included osseous structures are nonsuspicious. Surgical clips in the included right abdomen compatible with cholecystectomy. IMPRESSION: Interstitial and to lesser extent alveolar airspace opacities concerning for pneumonia, less likely pulmonary edema. Chronic inflammatory process is possible. Electronically Signed   By: Feb 24, 2016 M.D.   On: 09/23/2016 22:35   Abd 1 View (kub)  Result Date: 09/24/2016 CLINICAL DATA:  Abdominal pain intermittently for 2-3 weeks. EXAM: ABDOMEN - 1 VIEW COMPARISON:  CT 02/21/2016 FINDINGS: The bowel gas pattern is normal. No radio-opaque calculi or other significant radiographic abnormality are seen. IMPRESSION: Negative. Electronically Signed   By: 04/22/2016 M.D.   On: 09/24/2016 01:22    14/03/2016, MD 09/24/2016, 8:09 AM PGY-1, [facility] Family Medicine FPTS Intern pager: [phone], text pages welcome

## 2016-09-24 NOTE — Progress Notes (Signed)
Family medicine teaching service paged when patient arrived to the floor

## 2016-09-25 DIAGNOSIS — Z8679 Personal history of other diseases of the circulatory system: Secondary | ICD-10-CM

## 2016-09-25 DIAGNOSIS — R109 Unspecified abdominal pain: Secondary | ICD-10-CM

## 2016-09-25 DIAGNOSIS — R739 Hyperglycemia, unspecified: Secondary | ICD-10-CM

## 2016-09-25 DIAGNOSIS — K5900 Constipation, unspecified: Secondary | ICD-10-CM

## 2016-09-25 LAB — BASIC METABOLIC PANEL
ANION GAP: 8 (ref 5–15)
ANION GAP: 9 (ref 5–15)
BUN: 11 mg/dL (ref 6–20)
BUN: 8 mg/dL (ref 6–20)
CHLORIDE: 106 mmol/L (ref 101–111)
CHLORIDE: 104 mmol/L (ref 101–111)
CO2: 22 mmol/L (ref 22–32)
CO2: 22 mmol/L (ref 22–32)
CREATININE: 0.53 mg/dL (ref 0.44–1.00)
CREATININE: 0.68 mg/dL (ref 0.44–1.00)
Calcium: 8.8 mg/dL — ABNORMAL LOW (ref 8.9–10.3)
Calcium: 8.4 mg/dL — ABNORMAL LOW (ref 8.9–10.3)
GFR calc non Af Amer: >60 mL/min (ref >60)
GFR calc non Af Amer: >60 mL/min (ref >60)
Glucose, Bld: 340 mg/dL — ABNORMAL HIGH (ref 65–99)
Glucose, Bld: 248 mg/dL — ABNORMAL HIGH (ref 65–99)
POTASSIUM: 3.6 mmol/L (ref 3.5–5.1)
Potassium: 3.9 mmol/L (ref 3.5–5.1)
SODIUM: 136 mmol/L (ref 135–145)
SODIUM: 135 mmol/L (ref 135–145)

## 2016-09-25 LAB — GLUCOSE, CAPILLARY
GLUCOSE-CAPILLARY: 318 mg/dL — AB (ref 65–99)
GLUCOSE-CAPILLARY: 301 mg/dL — AB (ref 65–99)
GLUCOSE-CAPILLARY: 319 mg/dL — AB (ref 65–99)
GLUCOSE-CAPILLARY: 70 mg/dL (ref 65–99)
Glucose-Capillary: 58 mg/dL — ABNORMAL LOW (ref 65–99)
Glucose-Capillary: 66 mg/dL (ref 65–99)
Glucose-Capillary: 96 mg/dL (ref 65–99)

## 2016-09-25 MED ORDER — INSULIN ASPART 100 UNIT/ML ~~LOC~~ SOLN
5.0000 [IU] | Freq: Three times a day (TID) | SUBCUTANEOUS | Status: DC
  Administered 2016-09-25 – 2016-09-26 (×3): 5 [IU] via SUBCUTANEOUS

## 2016-09-25 MED ORDER — INSULIN ASPART 100 UNIT/ML ~~LOC~~ SOLN
0.0000 [IU] | Freq: Three times a day (TID) | SUBCUTANEOUS | Status: DC
  Administered 2016-09-26: 5 [IU] via SUBCUTANEOUS
  Administered 2016-09-26: 11 [IU] via SUBCUTANEOUS
  Administered 2016-09-26: 5 [IU] via SUBCUTANEOUS

## 2016-09-25 MED ORDER — POLYETHYLENE GLYCOL 3350 17 G PO PACK
17.0000 g | PACK | Freq: Every day | ORAL | Status: DC
  Administered 2016-09-25 – 2016-09-26 (×2): 17 g via ORAL
  Filled 2016-09-25 (×2): qty 1

## 2016-09-25 MED ORDER — INSULIN ASPART 100 UNIT/ML ~~LOC~~ SOLN: 0.0000 [IU] | Freq: Three times a day (TID) | SUBCUTANEOUS | Status: DC

## 2016-09-25 MED ORDER — INSULIN ASPART 100 UNIT/ML ~~LOC~~ SOLN
0.0000 [IU] | Freq: Three times a day (TID) | SUBCUTANEOUS | Status: DC
  Administered 2016-09-25: 15 [IU] via SUBCUTANEOUS

## 2016-09-25 MED ORDER — INSULIN GLARGINE 100 UNIT/ML ~~LOC~~ SOLN
25.0000 [IU] | Freq: Every day | SUBCUTANEOUS | Status: DC
  Administered 2016-09-25: 25 [IU] via SUBCUTANEOUS
  Filled 2016-09-25 (×3): qty 0.25

## 2016-09-25 NOTE — Progress Notes (Signed)
Inpatient Diabetes Program Recommendations  AACE/ADA: New Consensus Statement on Inpatient Glycemic Control (2015)  Target Ranges:  Prepandial:   less than 140 mg/dL      Peak postprandial:   less than 180 mg/dL (1-2 hours)      Critically ill patients:  140 - 180 mg/dL   Lab Results  Component Value Date   GLUCAP 227 (H) 09/24/2016   HGBA1C >15.0 09/23/2016    Review of Glycemic Control Results for DAJANAE, BROPHY (MRN [id number]) as of 09/25/2016 06:46  Ref. Range 09/24/2016 11:25 09/24/2016 12:33 09/24/2016 16:35 09/24/2016 18:20 09/24/2016 20:53  Glucose-Capillary Latest Ref Range: 65 - 99 mg/dL 664 (H) 403 (H) 474 (H) 396 (H) 227 (H)   Diabetes history: DM2 Outpatient Diabetes medications: Lantus 26 units (clarified with patient) + Metformin 1 gm bid Current orders for Inpatient glycemic control: Lantus 18 + Novolog correction 0-15 tid  Inpatient Diabetes Program Recommendations:    -Increase Lantus to 26 units -Add Novolog 4-5 units meal coverage tid if eats 50%  Thank you, Darel Hong E. Vishruth Seoane, RN, MSN, CDE Inpatient Glycemic Control Team Team Pager [phone] (8am-5pm) 09/25/2016 6:50 AM

## 2016-09-25 NOTE — Progress Notes (Signed)
Pt  Transferred to 5W-08 via wheelchair with NA and niece. Belongings with niece.

## 2016-09-25 NOTE — Progress Notes (Signed)
Hypoglycemic Event  CBG: 58  Treatment: 15 GM carbohydrate snack (4oz soda)  Symptoms: Sweaty, Shaky and Hungry  Follow-up CBG: Time:2046 CBG Result:70  Possible Reasons for Event: inadequate meal intake  Comments/MD notified:Family Medicine Teaching Service paged [phone]    Waynette Buttery

## 2016-09-25 NOTE — Progress Notes (Signed)
Contacted 5W to give hand off report for patient, left on hold. Providing report to pm nurse for hand off report to 5W RN

## 2016-09-25 NOTE — Progress Notes (Signed)
Report called to [place], Charity fundraiser. All questions answered to receiving RN's satisfaction.

## 2016-09-25 NOTE — Progress Notes (Signed)
Received report from Tiaram Rn in 4E to transfer to [id number].

## 2016-09-25 NOTE — Progress Notes (Signed)
Family Medicine Teaching Service Daily Progress Note Intern Pager: [phone]  Patient name: Makayla Vasquez Medical record number: [id number] Date of birth: November 20, 1980 Age: 35 y.o. Gender: female  Primary Care Provider: Tarri Abernethy, MD Consultants: None Code Status: Full code  Pt Overview and Major Events to Date:  1. Admit to FMTS strart insulin drip 2. Stop insulin drip  Assessment and Plan: Type 2 diabetic with severe hyperglycemia:  Takes 26U lantus and metformin 1000mg  BID at home. Hgb A1C today was >15.  Transitioned off insulin drip yesterday and started on 18U lantus with mSSI. CBG this AM was 340. 15U novolog yesterday.  Increased lantus to 25U.  K+ this AM was 3.6.  - add 5U novolog w/ meals - increased lantus to 25U and mSSI - carb modified diet - recommend checking sugars 4x daily when discharged.   Abdominal pain: Diffuse abdominal pain without peritoneal signs or guarding.  No leukocytosis or systemic signs. Last BM was two days ago.  Denies NVD or sick contacts. Lipase and LFT and KUB negative.  Last BM 4 days ago. 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 on 05/15/16.  Last echo was 8/17 and showed 50-55% EF and normal systolic and diastolic function of the LV.  Does not appear fluid overloaded on exam, no peripheral edema, no bibasilar lung crackles. No reports of pressure-like chest pain or worsening shortness of breath. Troponin <0.03.  On lasix 20mg  PO daily and had 2.4L UOP yesterday. Weight ?increased 7 pounds this AM.  - recheck weight - d/c cardiac monitor - Strict I/O - daily weights - Continue home lasix 20mg  Oral   Congenital complete AV block, with pacemaker in situ: Pacemaker in place. No issues of late.  HR 79 this morning.  - Monitor   Anti-synthetase syndrome with ILD:  Followed  by rhematologist at [facility].  Takes 17.5mg  daily prednisone and requies 2-4L O2 at home.  Currently on 3L [place] satting 100%.  Receiving chemotherapy at [facility] and her last dose was reportedly in [place].  - Continue prednisone - continue cellcept - continue home O2 - albuterol PRN  HTN:  Takes coreg 12.5mg  daily and zebeta 2.5 mg daily.  96/58 this AM.  - holding coreg for morning  Bipolar affective disorder: On no medications for this at this time. - Monitor   FEN/GI: carb modified diet Prophylaxis: lovenox  Disposition: discharge home when stable  Subjective:  Patient feels well this morning. Abdominal pain improved.  Did not have any BMs. Denies NVD or CP.   Objective: Temp:  [98 F (36.7 C)-99.1 F (37.3 C)] 99 F (37.2 C) (12/07 0810) Pulse Rate:  [59-86] 71 (12/07 0810) Resp:  [14-29] 29 (12/07 0810) BP: (86-102)/(47-73) 96/58 (12/07 0810) SpO2:  [99 %-100 %] 99 % (12/07 0810) Weight:  [149 lb 7.6 oz (67.8 kg)] 149 lb 7.6 oz (67.8 kg) (12/07 0806) Physical Exam: General: 35yo F sitting up in bed appearing comfortable with [place] in place ENTM: MMM Cardiovascular: RRR, no murmurs, palpable pulses Respiratory: NWOB, CTABL, no wheezing or rhonchi Gastrointestinal: soft, mild diffuse tenderness without guarding and no peritoneal signs present MSK: no deformities, able to move all extremities Derm: warm and dry, no rashes noted Neuro: AAOx3 Psych: Normal mood and affect  Laboratory:  Recent Labs Lab 09/23/16 1724 09/23/16 2228 09/24/16 0448  WBC  7.3 6.9 5.2  HGB 13.0 11.5* 11.1*  HCT 40.2 36.5 35.3*  PLT 273 237 229    Recent Labs Lab 09/23/16 2228  09/24/16 0930 09/24/16 1643 09/25/16 0520  NA 137  < > 137 134* 136  K 3.6  < > 3.5 4.3 3.6  CL 105  < > 107 106 106  CO2 21*  < > 22 20* 22  BUN 10  < > 7 11 11   CREATININE 0.61  < > 0.46 0.58 0.53  CALCIUM 8.9  < > 8.7* 8.6* 8.8*  PROT 6.8  --   --   --   --   BILITOT 0.7  --   --   --   --   ALKPHOS 71  --    --   --   --   ALT 16  --   --   --   --   AST 17  --   --   --   --   GLUCOSE 337*  < > 223* 451* 340*  < > = values in this interval not displayed.  Imaging/Diagnostic Tests: Dg Chest 2 View  Result Date: 09/23/2016 CLINICAL DATA:  Hyperglycemia for 2 weeks. History of diabetes and heart failure. EXAM: CHEST  2 VIEW COMPARISON:  Chest radiograph Feb 21, 2016 and chest CT Feb 22, 2016 FINDINGS: Cardiomediastinal silhouette is normal. Coarsened pulmonary interstitium with faint alveolar airspace opacities. No pleural effusion. No pneumothorax. LEFT cardiac pacemaker in situ. Soft tissue planes and included osseous structures are nonsuspicious. Surgical clips in the included right abdomen compatible with cholecystectomy. IMPRESSION: Interstitial and to lesser extent alveolar airspace opacities concerning for pneumonia, less likely pulmonary edema. Chronic inflammatory process is possible. Electronically Signed   By: Feb 24, 2016 M.D.   On: 09/23/2016 22:35   Abd 1 View (kub)  Result Date: 09/24/2016 CLINICAL DATA:  Abdominal pain intermittently for 2-3 weeks. EXAM: ABDOMEN - 1 VIEW COMPARISON:  CT 02/21/2016 FINDINGS: The bowel gas pattern is normal. No radio-opaque calculi or other significant radiographic abnormality are seen. IMPRESSION: Negative. Electronically Signed   By: 04/22/2016 M.D.   On: 09/24/2016 01:22    14/03/2016, MD 09/25/2016, 8:19 AM PGY-1, [place] [facility] FPTS Intern pager: [phone], text pages welcome

## 2016-09-26 ENCOUNTER — Other Ambulatory Visit: Payer: Self-pay | Admitting: *Deleted

## 2016-09-26 DIAGNOSIS — Z794 Long term (current) use of insulin: Secondary | ICD-10-CM

## 2016-09-26 DIAGNOSIS — Z7952 Long term (current) use of systemic steroids: Secondary | ICD-10-CM

## 2016-09-26 DIAGNOSIS — E118 Type 2 diabetes mellitus with unspecified complications: Secondary | ICD-10-CM

## 2016-09-26 DIAGNOSIS — E1165 Type 2 diabetes mellitus with hyperglycemia: Principal | ICD-10-CM

## 2016-09-26 LAB — GLUCOSE, CAPILLARY
GLUCOSE-CAPILLARY: 226 mg/dL — AB (ref 65–99)
GLUCOSE-CAPILLARY: 250 mg/dL — AB (ref 65–99)
GLUCOSE-CAPILLARY: 338 mg/dL — AB (ref 65–99)

## 2016-09-26 LAB — BASIC METABOLIC PANEL
ANION GAP: 9 (ref 5–15)
BUN: 12 mg/dL (ref 6–20)
CALCIUM: 8.5 mg/dL — AB (ref 8.9–10.3)
CO2: 24 mmol/L (ref 22–32)
CREATININE: 0.7 mg/dL (ref 0.44–1.00)
Chloride: 103 mmol/L (ref 101–111)
GFR calc non Af Amer: >60 mL/min (ref >60)
Glucose, Bld: 327 mg/dL — ABNORMAL HIGH (ref 65–99)
Potassium: 3.2 mmol/L — ABNORMAL LOW (ref 3.5–5.1)
SODIUM: 136 mmol/L (ref 135–145)

## 2016-09-26 MED ORDER — POTASSIUM CHLORIDE CRYS ER 20 MEQ PO TBCR
40.0000 meq | EXTENDED_RELEASE_TABLET | Freq: Two times a day (BID) | ORAL | Status: AC
  Administered 2016-09-26: 40 meq via ORAL
  Filled 2016-09-26: qty 2

## 2016-09-26 MED ORDER — INSULIN GLARGINE 100 UNIT/ML ~~LOC~~ SOLN
30.0000 [IU] | Freq: Every day | SUBCUTANEOUS | Status: DC
  Administered 2016-09-27: 30 [IU] via SUBCUTANEOUS
  Filled 2016-09-26: qty 0.3

## 2016-09-26 MED ORDER — INSULIN ASPART 100 UNIT/ML ~~LOC~~ SOLN
8.0000 [IU] | Freq: Three times a day (TID) | SUBCUTANEOUS | Status: DC
  Administered 2016-09-26 – 2016-09-27 (×4): 8 [IU] via SUBCUTANEOUS

## 2016-09-26 MED ORDER — INSULIN ASPART 100 UNIT/ML ~~LOC~~ SOLN
0.0000 [IU] | Freq: Three times a day (TID) | SUBCUTANEOUS | Status: DC
  Administered 2016-09-27: 11 [IU] via SUBCUTANEOUS

## 2016-09-26 NOTE — Progress Notes (Signed)
Family Medicine Teaching Service Daily Progress Note Intern Pager: [phone]  Patient name: Makayla Vasquez Medical record number: [id number] Date of birth: 1981-09-26 Age: 35 y.o. Gender: female  Primary Care Provider: Tarri Abernethy, MD Consultants: None Code Status: Full code  Pt Overview and Major Events to Date:  1. Admit to FMTS strart insulin drip 2. Stop insulin drip  Assessment and Plan: Type 2 diabetic with severe hyperglycemia:  Takes 26U lantus and metformin 1000mg  BID at home. Hgb A1C today was >15.  Transitioned off insulin drip yesterday and started on 18U lantus with mSSI. CBG this AM was 340. 15U novolog yesterday.  Increased lantus to 25U yesterday and was started on rSSI and 5U novolog with meals. Patient did have several episodes of hypoglycemia last night.   K+ this AM was 3.4.  Required total of 93 U insulin past 24 hours. Patient apparently received both mSSI and rSSI totaling 30U in one dose yesterday that would explain acute drop in sugar.  - kdur 40 BID one dose - increase lantus to 30U and mSSI and increase meal coverage to 8U novolog  - carb modified diet - recommend checking sugars 4x daily when discharged.  - repeat BMP  Abdominal pain, improving: Diffuse abdominal pain without peritoneal signs or guarding.  No leukocytosis or systemic signs. Last BM was two days ago.  Denies NVD or sick contacts. Lipase and LFT and KUB negative.  Last BM yesterday. Pain is improved.  - continue to monitor and currently holding on pain medication - senna PRN, miralax BID scheduled  Chronic systolic heart failure/congestive dilated cardiomyopathy, stable: Due to NICM last seen by Dr. on 05/15/16.  Last echo was 8/17 and showed 50-55% EF and normal systolic and diastolic function of the LV.  Does not appear fluid overloaded on exam, no peripheral edema, no bibasilar lung crackles. No reports of pressure-like chest pain or worsening shortness of breath. Troponin <0.03.   On lasix 20mg  PO daily and had 2L UOP yesterday.  - Strict I/O - daily weights - Continue home lasix 20mg  Oral   Congenital complete AV block, with pacemaker in situ: Pacemaker in place. No issues of late.  HR 73 this morning.  - Monitor   Anti-synthetase syndrome with ILD:  Followed by [facility]hematologist at [facility].  Takes 17.5mg  daily prednisone and requies 2-4L O2 at home.  Currently on 3L [place] satting 100%.  Receiving chemotherapy at [facility] and her last dose was reportedly in [place].  - Continue prednisone - continue cellcept - continue home O2 - albuterol PRN  HTN:  Takes cozaar 12.5mg  daily and zebeta 2.5 mg daily.  91/53 this AM.  - continue to hold cozaar and BB  Bipolar affective disorder: On no medications for this at this time. - Monitor   FEN/GI: carb modified diet  Prophylaxis: lovenox  Disposition: discharge home when stable  Subjective:  Patient feels well this morning. Had BM yesterday and abdominal pain further improving.  Denies NVD or CP.   Objective: Temp:  [97.6 F (36.4 C)-99.3 F (37.4 C)] 98.5 F (36.9 C) (12/08 0551) Pulse Rate:  [70-82] 73 (12/08 0551) Resp:  [17-30] 17 (12/08 0551) BP: (82-106)/(53-63) 91/53 (12/08 0551) SpO2:  [98 %-100 %] 100 % (12/08 0551) Weight:  [144 lb 2.9 oz (65.4 kg)-150 lb 9.2 oz (68.3 kg)] 144 lb 2.9 oz (65.4 kg) (12/07 2116) Physical Exam: General: 35yo F sitting up in bed appearing comfortable with Green in place ENTM: MMM Cardiovascular: RRR, no  murmurs, palpable pulses Respiratory: NWOB, CTABL, no wheezing or rhonchi Gastrointestinal: soft, mild diffuse tenderness without guarding and no peritoneal signs present MSK: no deformities, able to move all extremities Derm: warm and dry, no rashes noted Neuro: AAOx3 Psych: Normal mood and affect  Laboratory:  Recent Labs Lab 09/23/16 1724 09/23/16 2228 09/24/16 0448  WBC 7.3 6.9 5.2  HGB 13.0 11.5* 11.1*  HCT 40.2 36.5 35.3*  PLT 273 237 229    Recent Labs Lab  09/23/16 2228  09/24/16 1643 09/25/16 0520 09/25/16 1636  NA 137  < > 134* 136 135  K 3.6  < > 4.3 3.6 3.9  CL 105  < > 106 106 104  CO2 21*  < > 20* 22 22  BUN 10  < > 11 11 8   CREATININE 0.61  < > 0.58 0.53 0.68  CALCIUM 8.9  < > 8.6* 8.8* 8.4*  PROT 6.8  --   --   --   --   BILITOT 0.7  --   --   --   --   ALKPHOS 71  --   --   --   --   ALT 16  --   --   --   --   AST 17  --   --   --   --   GLUCOSE 337*  < > 451* 340* 248*  < > = values in this interval not displayed.  Imaging/Diagnostic Tests: Dg Chest 2 View  Result Date: 09/23/2016 CLINICAL DATA:  Hyperglycemia for 2 weeks. History of diabetes and heart failure. EXAM: CHEST  2 VIEW COMPARISON:  Chest radiograph Feb 21, 2016 and chest CT Feb 22, 2016 FINDINGS: Cardiomediastinal silhouette is normal. Coarsened pulmonary interstitium with faint alveolar airspace opacities. No pleural effusion. No pneumothorax. LEFT cardiac pacemaker in situ. Soft tissue planes and included osseous structures are nonsuspicious. Surgical clips in the included right abdomen compatible with cholecystectomy. IMPRESSION: Interstitial and to lesser extent alveolar airspace opacities concerning for pneumonia, less likely pulmonary edema. Chronic inflammatory process is possible. Electronically Signed   By: Feb 24, 2016 M.D.   On: 09/23/2016 22:35   Abd 1 View (kub)  Result Date: 09/24/2016 CLINICAL DATA:  Abdominal pain intermittently for 2-3 weeks. EXAM: ABDOMEN - 1 VIEW COMPARISON:  CT 02/21/2016 FINDINGS: The bowel gas pattern is normal. No radio-opaque calculi or other significant radiographic abnormality are seen. IMPRESSION: Negative. Electronically Signed   By: 04/22/2016 M.D.   On: 09/24/2016 01:22    14/03/2016, MD 09/26/2016, 8:39 AM PGY-1, [place] [facility] FPTS Intern pager: [phone], text pages welcome

## 2016-09-26 NOTE — Care Management Note (Signed)
Case Management Note  Patient Details  Name: Makayla Vasquez MRN: [id number] Date of Birth: 01-23-1981  Subjective/Objective:   Presenting with hyperglycemia . PMH is significant for T2DM,  congenital heart block with BV pacemaker, chronic systolic heart failure. Anti-synthetase syndromewith interstitial lung disease. From home with family. Independent with ADL's, no DME usage.      PCP: Brayton Caves  Action/Plan: Plan is for possible d/c today.  Expected Discharge Date:  09/27/16               Expected Discharge Plan:  Home/Self Care  In-House Referral:     Discharge planning Services  CM Consult  Post Acute Care Choice:  Resumption of Svcs/PTA Provider Choice offered to:  Patient  DME Arranged:  Oxygen DME Agency:  Patsy Lager  HH Arranged:    HH Agency:     Status of Service:  Completed, signed off  If discussed at Long Length of Stay Meetings, dates discussed:    Additional Comments:  Epifanio Lesches, RN 09/26/2016, 11:45 AM

## 2016-09-26 NOTE — Progress Notes (Signed)
Inpatient Diabetes Program Recommendations  AACE/ADA: New Consensus Statement on Inpatient Glycemic Control (2015)  Target Ranges:  Prepandial:   less than 140 mg/dL      Peak postprandial:   less than 180 mg/dL (1-2 hours)      Critically ill patients:  140 - 180 mg/dL   Lab Results  Component Value Date   GLUCAP 226 (H) 09/26/2016   HGBA1C >15.0 09/23/2016   Results for AISA, SCHOEPPNER (MRN [id number]) as of 09/26/2016 11:46  Ref. Range 09/25/2016 12:10 09/25/2016 16:08 09/25/2016 20:12 09/25/2016 20:16 09/25/2016 20:46 09/25/2016 21:19 09/26/2016 08:41  Glucose-Capillary Latest Ref Range: 65 - 99 mg/dL 683 (H) 419 (H) 66 58 (L) 70 96 226 (H)     Diabetes history:DM2  Outpatient Diabetes medications: Lantus 26 units (clarified with patient) + Metformin 1 gm bid  Current orders for Inpatient glycemic control: Lantus 25units qday,  Novolog correction 0-15units tid, Novolog 5 units tid  * prednisone 17.5mg  qam   Inpatient Diabetes Program Recommendations:  Patient experienced a low blood sugar yesterday after patient received Novolog 15units (0-20 unit scale) plus Novolog 11 units (0-15 unit scale)  and Novolog 5 units all at 1800 last evening.   Agree with current orders for blood sugar management.  Susette Racer, RN, BA, MHA, CDE Diabetes Coordinator Inpatient Diabetes Program  [phone] (Team Pager) [phone] [facility] Ed Office) 09/26/2016 12:11 PM

## 2016-09-27 DIAGNOSIS — E669 Obesity, unspecified: Secondary | ICD-10-CM

## 2016-09-27 DIAGNOSIS — E1169 Type 2 diabetes mellitus with other specified complication: Secondary | ICD-10-CM

## 2016-09-27 LAB — GLUCOSE, CAPILLARY: Glucose-Capillary: 293 mg/dL — ABNORMAL HIGH (ref 65–99)

## 2016-09-27 MED ORDER — INSULIN ASPART 100 UNIT/ML FLEXPEN: 8.0000 [IU] | PEN_INJECTOR | Freq: Three times a day (TID) | SUBCUTANEOUS | 11 refills | Status: DC

## 2016-09-27 MED ORDER — INSULIN GLARGINE 100 UNIT/ML SOLOSTAR PEN: 30.0000 [IU] | PEN_INJECTOR | Freq: Every day | SUBCUTANEOUS | 5 refills | Status: DC

## 2016-09-27 MED ORDER — POTASSIUM CHLORIDE CRYS ER 20 MEQ PO TBCR
40.0000 meq | EXTENDED_RELEASE_TABLET | Freq: Once | ORAL | Status: AC
  Administered 2016-09-27: 40 meq via ORAL
  Filled 2016-09-27: qty 2

## 2016-09-27 NOTE — Discharge Instructions (Signed)
You were admitted to the hospital for high blood sugars. We feel that we now have an idea of how much insulin he should be needing a daily basis. We will you to take 30 units of Lantus daily, and 8 units of NovoLog 3 times daily with meals.  Your blood pressure has also been low during this admission and I want to do not take any of her medications until Monday when to follow-up at clinic.

## 2016-09-27 NOTE — Progress Notes (Signed)
[name] to be D/C'd Home per MD order.  Discussed with the patient and all questions fully answered.  VSS, Skin clean, dry and intact without evidence of skin break down, no evidence of skin tears noted. IV catheter discontinued intact. Site without signs and symptoms of complications. Dressing and pressure applied.  An After Visit Summary was printed and given to the patient. Patient received prescription.  D/c education completed with patient/family including follow up instructions, medication list, d/c activities limitations if indicated, with other d/c instructions as indicated by MD - patient able to verbalize understanding, all questions fully answered.   Patient instructed to return to ED, call 911, or call MD for any changes in condition.   Patient declined wheelchair and walked, and D/C home via private auto.  Makayla Vasquez 09/27/2016 12:44 PM

## 2016-09-27 NOTE — Progress Notes (Signed)
Family Medicine Teaching Service Daily Progress Note Intern Pager: [phone]  Patient name: Makayla Vasquez Medical record number: [id number] Date of birth: Apr 09, 1981 Age: 35 y.o. Gender: female  Primary Care Provider: Tarri Abernethy, MD Consultants: None Code Status: Full code  Pt Overview and Major Events to Date:  1. Admit to FMTS strart insulin drip 2. Stop insulin drip  Assessment and Plan: Type 2 diabetic with severe hyperglycemia:  On 12/7 required total of 93 U insulin and patient was supposed to be started on 30U lantus and 8U TID with meals with mSSI.  She likely missed her lantus dose yesterday.  Started rSSI last night.  Sugar this morning was 293. K was 3.2 this morning.  - kdur 40 once - discharge on 30U lantus and novolog 8U TID with meals - carb modified diet - recommend checking sugars 4x daily when discharged.   Abdominal pain, improving: Diffuse abdominal pain without peritoneal signs or guarding.  No leukocytosis or systemic signs. Last BM was two days ago.  Denies NVD or sick contacts. Lipase and LFT and KUB negative.  Last BM two days ago - continue to monitor and currently holding on pain medication - senna PRN, miralax BID scheduled  Chronic systolic heart failure/congestive dilated cardiomyopathy, stable: Does not appear fluid overloaded on exam, no peripheral edema, no bibasilar lung crackles.  - Strict I/O - daily weights - Continue home lasix 20mg  Oral   Congenital complete AV block, with pacemaker in situ: Pacemaker in place. No issues of late.  HR 73 this morning.  - Monitor   Anti-synthetase syndrome with ILD:  Followed by rhematologist at [facility].  Takes 17.5mg  daily prednisone and requies 2-4L O2 at home.  Currently on 3L [place] satting 100%.  Receiving chemotherapy at [facility] and her last dose was reportedly in [place].  - Continue prednisone - continue cellcept - continue home O2 - albuterol PRN  HTN:  Takes cozaar 12.5mg  daily and zebeta 2.5 mg  daily.  91/53 this AM.  - continue to hold cozaar and BB  Bipolar affective disorder: On no medications for this at this time. - Monitor   FEN/GI: carb modified diet  Prophylaxis: lovenox  Disposition: discharge home when stable  Subjective:  Patient feels well this morning. Denies abd pain, NVD, CP or SOB.   Objective: Temp:  [98.2 F (36.8 C)-98.3 F (36.8 C)] 98.2 F (36.8 C) (12/09 0650) Pulse Rate:  [42-74] 74 (12/09 0650) Resp:  [16-17] 16 (12/09 0650) BP: (85-101)/(56-61) 97/56 (12/09 0650) SpO2:  [92 %-100 %] 100 % (12/09 0650) Physical Exam: General: 35yo F sitting up in bed appearing comfortable with [place] in place ENTM: MMM Cardiovascular: RRR, no murmurs, palpable pulses Respiratory: NWOB, CTABL, no wheezing or rhonchi Gastrointestinal: soft, mild diffuse tenderness without guarding and no peritoneal signs present MSK: no deformities, able to move all extremities Derm: warm and dry, no rashes noted Neuro: AAOx3 Psych: Normal mood and affect  Laboratory:  Recent Labs Lab 09/23/16 1724 09/23/16 2228 09/24/16 0448  WBC 7.3 6.9 5.2  HGB 13.0 11.5* 11.1*  HCT 40.2 36.5 35.3*  PLT 273 237 229    Recent Labs Lab 09/23/16 2228  09/25/16 0520 09/25/16 1636 09/26/16 1033  NA 137  < > 136 135 136  K 3.6  < > 3.6 3.9 3.2*  CL 105  < > 106 104 103  CO2 21*  < > 22 22 24   BUN 10  < > 11 8 12   CREATININE 0.61  < >  0.53 0.68 0.70  CALCIUM 8.9  < > 8.8* 8.4* 8.5*  PROT 6.8  --   --   --   --   BILITOT 0.7  --   --   --   --   ALKPHOS 71  --   --   --   --   ALT 16  --   --   --   --   AST 17  --   --   --   --   GLUCOSE 337*  < > 340* 248* 327*  < > = values in this interval not displayed.  Imaging/Diagnostic Tests: Dg Chest 2 View  Result Date: 09/23/2016 CLINICAL DATA:  Hyperglycemia for 2 weeks. History of diabetes and heart failure. EXAM: CHEST  2 VIEW COMPARISON:  Chest radiograph Feb 21, 2016 and chest CT Feb 22, 2016 FINDINGS: Cardiomediastinal  silhouette is normal. Coarsened pulmonary interstitium with faint alveolar airspace opacities. No pleural effusion. No pneumothorax. LEFT cardiac pacemaker in situ. Soft tissue planes and included osseous structures are nonsuspicious. Surgical clips in the included right abdomen compatible with cholecystectomy. IMPRESSION: Interstitial and to lesser extent alveolar airspace opacities concerning for pneumonia, less likely pulmonary edema. Chronic inflammatory process is possible. Electronically Signed   By: Awilda Metro M.D.   On: 09/23/2016 22:35   Abd 1 View (kub)  Result Date: 09/24/2016 CLINICAL DATA:  Abdominal pain intermittently for 2-3 weeks. EXAM: ABDOMEN - 1 VIEW COMPARISON:  CT 02/21/2016 FINDINGS: The bowel gas pattern is normal. No radio-opaque calculi or other significant radiographic abnormality are seen. IMPRESSION: Negative. Electronically Signed   By: Ellery Plunk M.D.   On: 09/24/2016 01:22    Renne Musca, MD 09/27/2016, 10:45 AM PGY-1, [facility] FPTS Intern pager: [phone], text pages welcome

## 2016-09-29 ENCOUNTER — Encounter: Payer: Self-pay | Admitting: Family Medicine

## 2016-09-29 ENCOUNTER — Ambulatory Visit (INDEPENDENT_AMBULATORY_CARE_PROVIDER_SITE_OTHER): Payer: Medicare Other | Admitting: Family Medicine

## 2016-09-29 DIAGNOSIS — Z23 Encounter for immunization: Secondary | ICD-10-CM

## 2016-09-29 DIAGNOSIS — I5022 Chronic systolic (congestive) heart failure: Secondary | ICD-10-CM | POA: Diagnosis not present

## 2016-09-29 MED ORDER — LOSARTAN POTASSIUM 25 MG PO TABS: 12.5000 mg | ORAL_TABLET | Freq: Every day | ORAL | Status: DC

## 2016-09-29 NOTE — Progress Notes (Signed)
    Subjective:  Makayla Vasquez is a 35 y.o. female who presents to the [facility] today for a hospital followup.  HPI: Diabetes - Has been compliant on insulin regimen as instructed on hospital discharge: 30U lantus daily, 8U novolog TID with meals.  - Morning fasting readings the last 2 days have been 285 and 251. Afternoon and bedtime sugars have been 293-446. - Has had no episodes of hypoglycemia. Denies sweating, HA, palpitations. - Denies polyuria or polydipsia  H/o chronic systolic heart failure and congestive dilated cardiomyopathy with hypotension during hospital stay  - Has not been taking home bisoprolol or losartan since hospital discharge as instructed   ROS: Per HPI  PMH: Smoking history reviewed.   Objective:  Physical Exam: BP 104/70 (BP Location: Left Arm, Patient Position: Sitting, Cuff Size: Normal)   Pulse 77   Temp 97.4 F (36.3 C) (Oral)   Ht 5\' 5"  (1.651 m)   Wt 145 lb 6.4 oz (66 kg)   BMI 24.20 kg/m   Gen: NAD, resting comfortably with nasal cannula in place CV: RRR with no murmurs appreciated Pulm: NWOB on O2 supplementation via nasal cannula, CTAB with no crackles, wheezes, or rhonchi MSK: no edema, cyanosis, or clubbing noted Skin: warm, dry Neuro: grossly normal, moves all extremities Psych: Normal affect and thought content   Assessment/Plan:  Type 2 diabetes mellitus with hyperglycemia, with long-term current use of insulin (HCC) Poorly controlled given elevated morning fasting BG. Instructed patient to increase home lantus by 1U each day that >150. Continue 8U novolog with meals Instructed patient on signs and symptoms of hypoglycemia. Follow up in 2 weeks with PCP or pharmacy clinic  Chronic systolic heart failure [facility]) Patient has had recent worsening of heart failure so would benefit from restarting losartan 12.5mg  daily given improvement in BP. Since BP still on lower side, will hold off restarting bisoprolol until next visit in 2 weeks and  reevaluate at that time.  Health maintenance Flu shot given today.  [facility], DO PGY-1, [place] [facility] 09/29/2016 9:25 AM

## 2016-09-29 NOTE — Assessment & Plan Note (Addendum)
Patient has had recent worsening of heart failure so would benefit from restarting losartan 12.5mg  daily given improvement in BP. Since BP still on lower side, will hold off restarting bisoprolol until next visit in 2 weeks and reevaluate at that time.

## 2016-09-29 NOTE — Assessment & Plan Note (Addendum)
Poorly controlled given elevated morning fasting BG. Instructed patient to increase home lantus by 1U each day that >150. Continue 8U novolog with meals Instructed patient on signs and symptoms of hypoglycemia. Follow up in 2 weeks with PCP or pharmacy clinic

## 2016-09-29 NOTE — Patient Instructions (Addendum)
It was great to meet you today  For your diabetes  - Please increase your daily lantus by 1 unit that your morning fasting blood sugar reading is greater than 150. Keep doing the 8 units of novolog with each meal, three times a day.  - Be on the lookout and do not increase your insulin if you have signs of hypoglycemia  For your heart failure - Please resume taking your losartan 12.5mg  daily but do not start back your home bisoprolol at this time.  Thank you for getting your flu shot today!  Please make an appointment to be seen by your PCP or our clinical pharmacist Dr. Raymondo Band in 2 weeks to see how your sugars are doing. We can also discuss if we need to start back your bisoprolol at that time.  Take care and seek immediate care sooner if you develop any concerns.   Dr. Leland Her, DO [facility]

## 2016-09-30 ENCOUNTER — Telehealth: Payer: Self-pay | Admitting: *Deleted

## 2016-10-02 ENCOUNTER — Telehealth: Payer: Self-pay | Admitting: Internal Medicine

## 2016-10-02 NOTE — Telephone Encounter (Signed)
Returned patient's call regarding medication changes. Patient was wondering if she should continue taking calcium supplements. Discussed all of patient's medications, and recommended continuing calcium supplements. Stressed that patient should not be taking bisoprolol right now, and we will reevaluate resuming that at her appointment in four days. Patient voiced understanding.   Tarri Abernethy, MD, MPH PGY-2 [facility] Pager [phone]

## 2016-10-02 NOTE — Telephone Encounter (Signed)
Patient came by office & would like doctor to give her a call in regards to her medication. Please give her a call at [phone].

## 2016-10-06 ENCOUNTER — Ambulatory Visit (INDEPENDENT_AMBULATORY_CARE_PROVIDER_SITE_OTHER): Payer: Medicare Other | Admitting: Family Medicine

## 2016-10-06 DIAGNOSIS — E1169 Type 2 diabetes mellitus with other specified complication: Secondary | ICD-10-CM

## 2016-10-06 DIAGNOSIS — E669 Obesity, unspecified: Secondary | ICD-10-CM

## 2016-10-06 MED ORDER — INSULIN GLARGINE 100 UNIT/ML SOLOSTAR PEN: 35.0000 [IU] | PEN_INJECTOR | Freq: Every day | SUBCUTANEOUS | 5 refills | Status: DC

## 2016-10-06 NOTE — Patient Instructions (Signed)
It was a pleasure seeing you today in our clinic. Today we discussed your diabetes. Here is the treatment plan we have discussed and agreed upon together:   - I would begin taking 35 units every morning of your Lantus (long-acting insulin).  - If you experience any lows in your blood sugar or feelings of rapid heart rate, sweatiness, or overall feeling ill then you can go back to the original dose of your Lantus. - Come back and see [place] in 2 months.

## 2016-10-06 NOTE — Assessment & Plan Note (Signed)
Improved: Patient is here for a hospital follow-up regarding her diabetes. She endorses good compliance with her injectable insulin regimen that she was recently placed on. Morning glucose levels continued to be in the high 100s to 200s. - Increase Lantus from 30 units daily to 35 units daily - Return precautions discussed as well as signs and symptoms of hypoglycemia. - Follow-up with PCP in 2 months when patient is due for A1c recheck.

## 2016-10-06 NOTE — Progress Notes (Signed)
   HPI  CC: Hospital follow-up and diabetes Patient is here for hospital follow-up and follow-up on her diabetes. She states she has been doing well with her new medication regimen and has been charting her CBGs at least 3 times daily every day since discharge. She denies any symptoms of hypoglycemia. Her high CBG was 414 but she states that that was after "eating something that was labeled sugar-free when it wasn't really sugarfree". She denies any symptoms of vision changes, headache, nausea, vomiting, diarrhea, or dysuria. She says that her CBGs typically run in the high 100s and 200s. She is very agreeable with making changes to her regimen today.  Review of Systems    See HPI for ROS. All other systems reviewed and are negative.  CC, SH/smoking status, and VS noted  Objective: BP 119/80 (BP Location: Left Arm, Patient Position: Sitting, Cuff Size: Normal)   Pulse 95   Temp 98.4 F (36.9 C) (Oral)   Wt 144 lb (65.3 kg)   SpO2 92%   BMI 23.96 kg/m  Gen: NAD, alert, cooperative, and pleasant. HEENT: NCAT, EOMI, PERRL CV: RRR, no murmur Resp: CTAB, no wheezes, non-labored, nasal cannula in place Ext: No edema, warm Neuro: Alert and oriented, Speech clear, No gross deficits  Assessment and plan:  Diabetes mellitus type 2 in obese [facility]) Improved: Patient is here for a hospital follow-up regarding her diabetes. She endorses good compliance with her injectable insulin regimen that she was recently placed on. Morning glucose levels continued to be in the high 100s to 200s. - Increase Lantus from 30 units daily to 35 units daily - Return precautions discussed as well as signs and symptoms of hypoglycemia. - Follow-up with PCP in 2 months when patient is due for A1c recheck.   Meds ordered this encounter  Medications  . Insulin Glargine (LANTUS SOLOSTAR) 100 UNIT/ML Solostar Pen    Sig: Inject 35 Units into the skin daily.    Dispense:  15 mL    Refill:  5     Kathee Delton,  MD,MS,  PGY3 10/06/2016 5:57 PM

## 2016-10-11 IMAGING — DX DG CHEST 2V
2 series · 2 of 2 positions shown · non-contrast
Comparison: 08/07/2014.

CLINICAL DATA: Shortness of breath.

EXAM:
CHEST  2 VIEW

[chest pa]
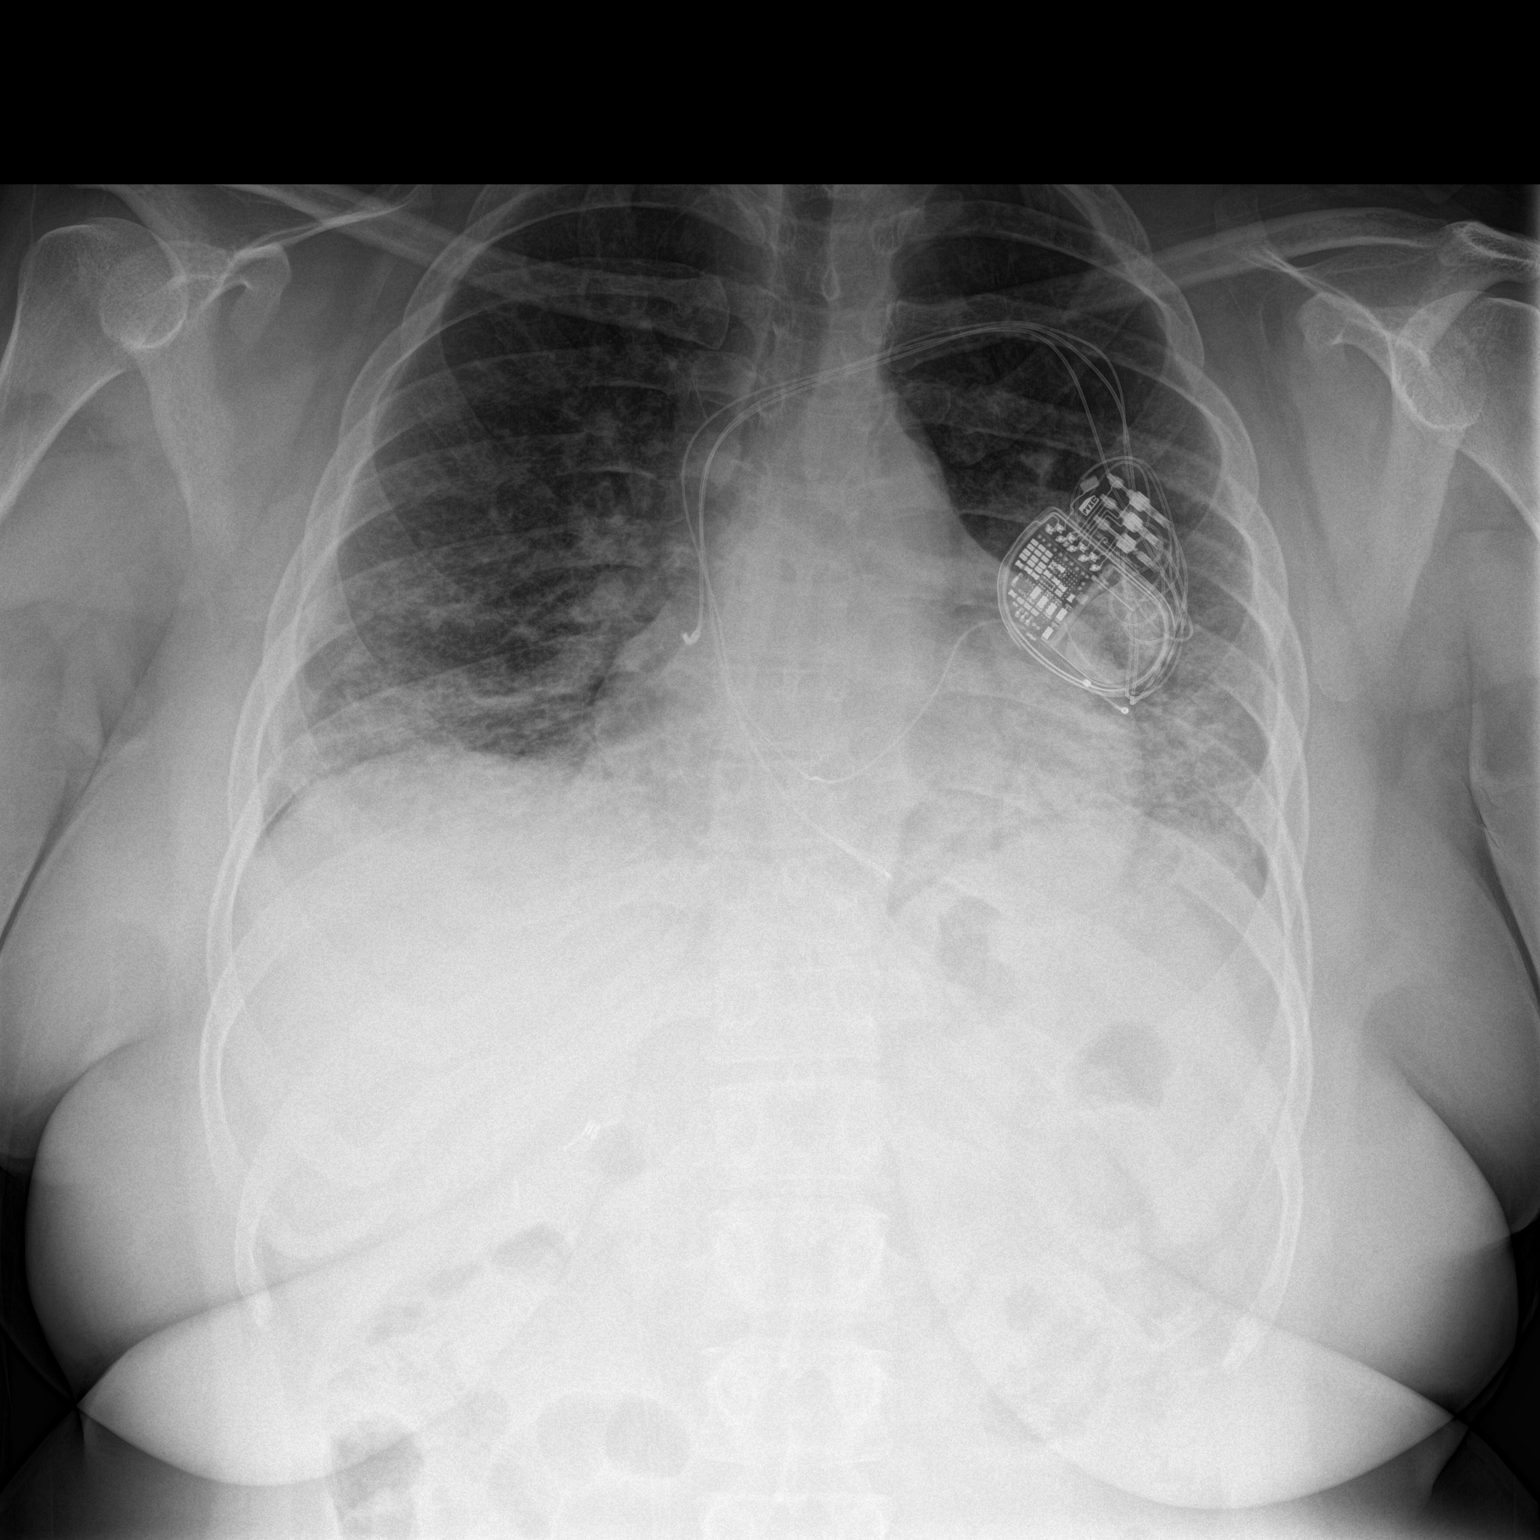

[chest lat]
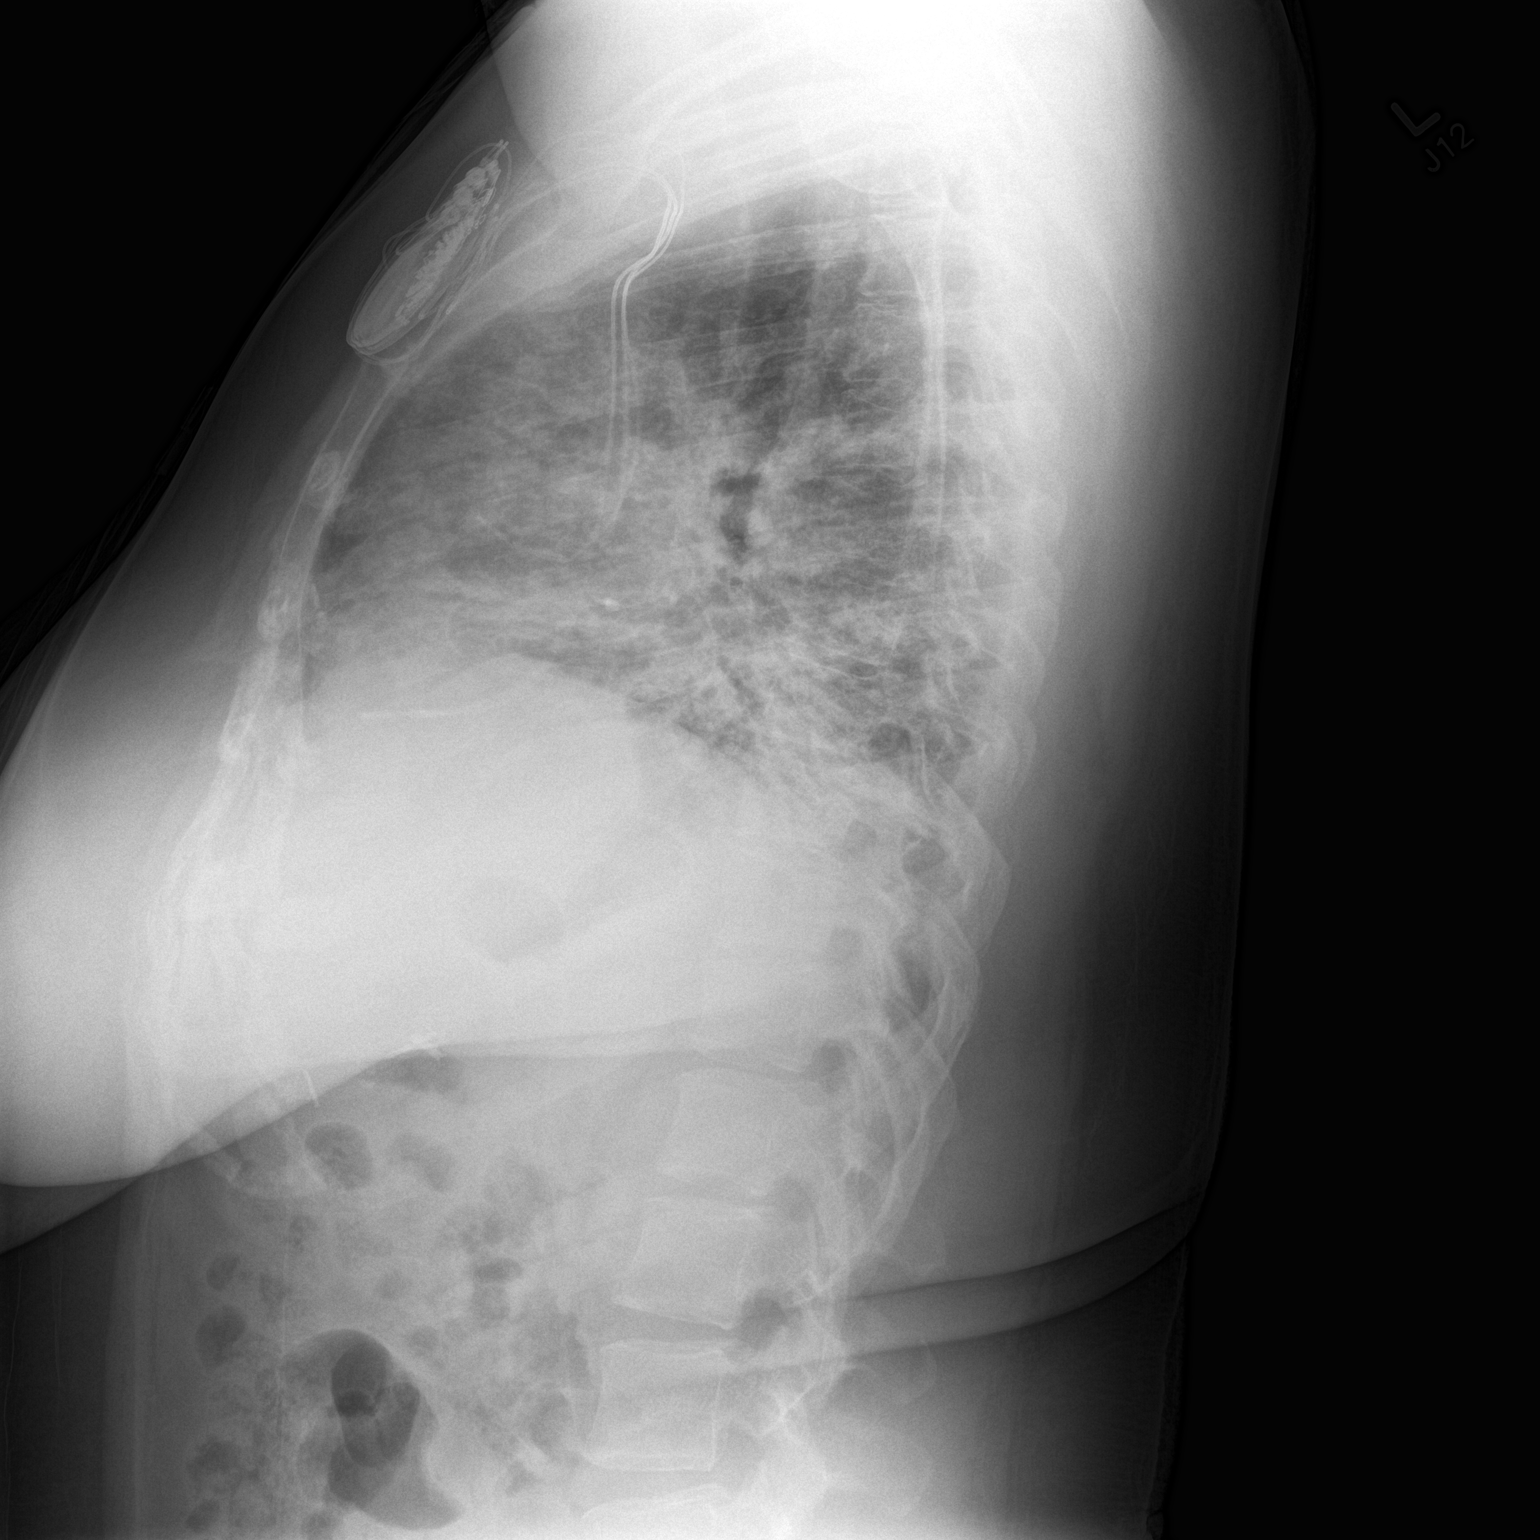

[2 of 2 positions shown; findings below may reference images not displayed]

FINDINGS: Mediastinum hilar structures normal. Heart size normal. Cardiac
pacer and lead tips in the right atrium right ventricle. Bibasilar
pulmonary infiltrates are noted. These improved slightly. No pleural
effusion or pneumothorax.
IMPRESSION: Bibasilar pulmonary infiltrates, improved from 08/07/2014.

## 2016-10-15 ENCOUNTER — Telehealth: Payer: Self-pay | Admitting: Internal Medicine

## 2016-10-15 NOTE — Telephone Encounter (Signed)
Spoke with mandy at [place]- [place] the treating practitioner must list in office note why pt must have a POC and cannot tolerate tanks.    Pt has a pending 10/27/2016 office visit, or June 2017 office visit note can be addended.    CY please advise if this can be placed in upcoming office visit note, or if you would like to addend June 2017 note.  Thanks!

## 2016-10-16 ENCOUNTER — Ambulatory Visit (INDEPENDENT_AMBULATORY_CARE_PROVIDER_SITE_OTHER): Payer: Medicare Other | Admitting: Family Medicine

## 2016-10-16 ENCOUNTER — Encounter: Payer: Self-pay | Admitting: Family Medicine

## 2016-10-16 DIAGNOSIS — M25561 Pain in right knee: Secondary | ICD-10-CM | POA: Diagnosis not present

## 2016-10-16 DIAGNOSIS — D8989 Other specified disorders involving the immune mechanism, not elsewhere classified: Secondary | ICD-10-CM

## 2016-10-16 DIAGNOSIS — G8929 Other chronic pain: Secondary | ICD-10-CM | POA: Diagnosis not present

## 2016-10-16 MED ORDER — OXYCODONE-ACETAMINOPHEN 5-325 MG PO TABS: 1.0000 | ORAL_TABLET | Freq: Four times a day (QID) | ORAL | 0 refills | Status: DC | PRN

## 2016-10-16 NOTE — Patient Instructions (Signed)
Thank you for coming in today, it was so nice to see you! Today we talked about:    Knee pain: Your pain is likely a flair up from your lupus. Please take the percocet only as needed with extreme pain. Otherwise, try Tylenol for pain. If your pain does not improve or gets worse over the next couple weeks, you may want to see your PCP.   If you have any questions or concerns, please do not hesitate to call the office at [phone]. You can also message me directly via MyChart.   Sincerely,  Anders Simmonds, MD

## 2016-10-16 NOTE — Progress Notes (Signed)
Subjective:    Patient ID: Makayla Vasquez , female   DOB: 09-15-1981 , 35 y.o..   MRN: [id number]  HPI  Makayla Vasquez is A 35 year old female past medical history of SLE on chronic steroids, dilated cardiomyopathy and congestive heart failure with pacemaker, type 2 diabetes, chronic respiratory failure on 4 L of oxygen here for  Chief Complaint  Patient presents with  . Knee Pain   Patient Is here for a same day visit. She states that she has had knee pain for the last year or so. She notes that over the last couple weeks her pain has been progressively worse. She notes that she typically takes Percocet for her knee pain which improves her pain. Last time she had a take Percocet was about 3-4 months ago. She has been able to walk but it is painful. Denies any trauma to the area. Those that she has been ambulating a lot over the last couple weeks just because she is getting ready for the holiday season. Denies any swelling or erythema. Admits to some locking and popping of her right knee. She has since she has followed up with the orthopedic specialists in the past but cannot remember the last time she saw them. Denies any weakness, numbness, tingling in the area.   Of note, she was diagnosed with Lupus in 05/2015. Follows with rheumatology. Last time she saw her rheumatologist was about 2-3 months ago.  Review of Systems: Per HPI. All other systems reviewed and are negative.  Medications: reviewed   Social Hx:  reports that she quit smoking about 20 years ago. Her smoking use included Cigarettes. She has a 0.12 pack-year smoking history. She has never used smokeless tobacco.   Objective:   BP 108/82   Pulse (!) 115   Temp 98.5 F (36.9 C) (Oral)   Ht 5\' 5"  (1.651 m)   Wt 154 lb (69.9 kg)   SpO2 99% Comment: @3L   BMI 25.63 kg/m  Physical Exam  Gen: NAD, alert, cooperative with exam, well-appearing HEENT: NCAT, PERRL, clear conjunctiva, oropharynx clear, supple neck, Nasal cannula  in place  Cardiac:Tachycardic rate with regular rhythm capillary refill brisk  Respiratory: non-labored breathing, nasal cannula with oxygen in place Psych: good insight, normal mood and affect Neuro: Normal gait MSK: Right Knee: Normal to inspection with no erythema or effusion or obvious bony abnormalities. Palpation with no warmth. Positive medial and lateral joint line tenderness and patellar tenderness.  ROM full in flexion and extension and lower leg rotation. Positive patellar crepitus Ligaments with solid consistent endpoints including ACL, PCL, LCL, MCL. Negative Mcmurray's Patellar and quadriceps tendons unremarkable. 5/5 Hamstring and quadriceps strength  Neurovascularly intact    Left Knee: Normal to inspection with no erythema or effusion or obvious bony abnormalities. Palpation normal with no warmth, joint line tenderness, patellar tenderness, or condyle tenderness. ROM full in flexion and extension and lower leg rotation. Ligaments with solid consistent endpoints including ACL, PCL, LCL, MCL. Non painful patellar compression. Patellar glide with crepitus. 5/5 Hamstring and quadriceps strength  Neurovascularly intact  Assessment & Plan:  Right knee pain Acute on chronic. 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 No gross abnormalities on knee exam besides crepitus and tenderness to palpation. No signs of gout or septic joint. Patient also prone to fractures due to chronic steroid use however she is ambulating without difficulty and there is no known trauma. Would not advise NSAIDs at this time due  to patient's cardiac history.  -Advised patient to try Tylenol as needed for pain.  -Prescription for Percocet 5-325 mg #30 pills with no refills given for severe pain.  -Continue to follow up with rheumatology.  -Consider knee XRAY and/or orthopedic referral if pain persists or worsens.  - Follow up with PCP as  needed   Anders Simmonds, MD [facility], PGY-2

## 2016-10-16 NOTE — Assessment & Plan Note (Addendum)
Acute on chronic. 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 No gross abnormalities on knee exam besides crepitus and tenderness to palpation. No signs of gout or septic joint. Patient also prone to fractures due to chronic steroid use however she is ambulating without difficulty and there is no known trauma. Would not advise NSAIDs at this time due to patient's cardiac history.  -Advised patient to try Tylenol as needed for pain.  -Prescription for Percocet 5-325 mg #30 pills with no refills given for severe pain.  -Continue to follow up with rheumatology.  -Consider knee XRAY and/or orthopedic referral if pain persists or worsens.  - Follow up with PCP as needed

## 2016-10-17 ENCOUNTER — Ambulatory Visit: Payer: Medicare Other | Attending: Physician Assistant | Admitting: Rehabilitation

## 2016-10-21 NOTE — Telephone Encounter (Signed)
CY please advise. thanks 

## 2016-10-24 NOTE — Telephone Encounter (Signed)
CY - please advise. Thanks! 

## 2016-10-24 NOTE — Telephone Encounter (Signed)
I will try to remember to address at 1/8 ov note

## 2016-10-25 ENCOUNTER — Encounter (HOSPITAL_COMMUNITY): Payer: Self-pay | Admitting: Emergency Medicine

## 2016-10-25 ENCOUNTER — Ambulatory Visit (HOSPITAL_COMMUNITY)
Admission: EM | Admit: 2016-10-25 | Discharge: 2016-10-25 | Disposition: A | Discharge status: Home / Self Care | Payer: Medicare Other | Attending: Family Medicine | Admitting: Family Medicine

## 2016-10-25 DIAGNOSIS — J208 Acute bronchitis due to other specified organisms: Secondary | ICD-10-CM

## 2016-10-25 MED ORDER — MOXIFLOXACIN HCL 400 MG PO TABS: 400.0000 mg | ORAL_TABLET | Freq: Every day | ORAL | 0 refills | Status: DC

## 2016-10-25 MED ORDER — IPRATROPIUM BROMIDE 0.06 % NA SOLN: 2.0000 | Freq: Four times a day (QID) | NASAL | 1 refills | Status: DC

## 2016-10-25 MED ORDER — GUAIFENESIN-CODEINE 100-10 MG/5ML PO SYRP: 10.0000 mL | ORAL_SOLUTION | Freq: Four times a day (QID) | ORAL | 0 refills | Status: DC | PRN

## 2016-10-25 NOTE — ED Triage Notes (Signed)
The patient presented to the [facility] with a complaint of cough and congestion x 6 days.

## 2016-10-25 NOTE — ED Provider Notes (Addendum)
Makayla Vasquez    CSN: [id number] Arrival date & time: 10/25/16  1310     History   Chief Complaint No chief complaint on file.   HPI Makayla Vasquez is a 36 y.o. female.   The history is provided by the patient.  Cough  Cough characteristics:  Productive Sputum characteristics:  Green Severity:  Moderate Onset quality:  Gradual Duration:  6 days Progression:  Unchanged Chronicity:  Recurrent Smoker: no   Context comment:  Chronic lung disease Relieved by:  None tried Worsened by:  Nothing Ineffective treatments:  None tried Associated symptoms: fever, rhinorrhea, shortness of breath and sinus congestion   Associated symptoms: no chills     Past Medical History:  Diagnosis Date  . Anxiety   . Arthritis    rheumatoid arthritis- mild, no rheumatology care   . Asthma    seasonal allergies   . Asymptomatic LV dysfunction    a. Echo in Dec 2011 with EF 35 to 40%. Felt to be due to paced rhythm. b. EF 25-30% in 07/2014.  . Bipolar affective disorder ([place])   . Cardiac pacemaker    a. Since age 27 in 71. b. Upgrade to BiV in 2013.  [name] Carpal tunnel syndrome of right wrist   . CHF (congestive heart failure) ([place])   . Congenital complete AV block   . Depression    bipolar  . Diabetes mellitus without complication ([place])   . GERD (gastroesophageal reflux disease)   . Hypertension   . Lupus   . Lupus (systemic lupus erythematosus) ([place])   . Obesity   . Pneumonitis    a. a/w hypoxia - inflammatory - large workup 07/2014.  . Presence of permanent cardiac pacemaker   . Seizures ([place])    as a child- from high fever  . Sinus tachycardia     Patient Active Problem List   Diagnosis Date Noted  . Right knee pain 10/16/2016  . Long term (current) use of systemic steroids   . History of heart failure   . Hyperglycemia 09/23/2016  . Breast pain, right 04/14/2016  . Chronic diastolic congestive heart failure ([place])   . Bacteremia 02/24/2016  . Myalgia and  myositis   . Type 2 diabetes mellitus with hyperglycemia, with long-term current use of insulin ([place])   . Anemia of chronic disease   . Bipolar affective disorder in remission ([place])   . Generalized weakness   . Acute blood loss anemia   . Cardiac pacemaker in situ   . Adrenal insufficiency ([place])   . Acute on chronic diastolic CHF (congestive heart failure) ([place])   . 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 Hypotension 07/08/2015  . Hyperlipidemia associated with type 2 diabetes mellitus ([place]) 05/04/2015  . Screening for cervical cancer 01/19/2015  . Onychomycosis 12/14/2014  . CHF (congestive heart failure) ([place])   . Diabetes mellitus type 2 in obese ([place])   . Chronic respiratory failure with hypoxia ([place]) 08/07/2014  . Insertion of implantable subdermal contraceptive 07/12/2014  . ILD (interstitial lung disease) ([place]) 06/19/2014  . Congestive dilated cardiomyopathy ([place]) 05/31/2014  . Vaginal itching 05/22/2014  . Constipated 03/03/2012  . Chronic systolic heart failure ([place]) 02/26/2012  . Intermittent abdominal pain 02/18/2012  . PPM-Medtronic   . Congenital complete AV block   . GERD (gastroesophageal reflux disease)   . THYROGLOSSAL DUCT CYST 07/18/2010  . RH FACTOR, NEGATIVE 04/08/2010  . Mild intermittent asthma without complication 83/25/4982  . POLYCYSTIC OVARY 12/17/2006  Past Surgical History:  Procedure Laterality Date  . ATRIAL TACH ABLATION N/A 08/14/2014   Procedure: ATRIAL TACH ABLATION;  Surgeon: [name], MD;  Location: [facility] CATH LAB;  Service: Cardiovascular;  Laterality: N/A;  . BI-VENTRICULAR PACEMAKER UPGRADE N/A 03/08/2012   Procedure: BI-VENTRICULAR PACEMAKER UPGRADE;  Surgeon: [name], MD;  Location: [facility] CATH LAB;  Service: Cardiovascular;  Laterality: N/A;  . CARPAL TUNNEL WITH CUBITAL TUNNEL Right 07/26/2013   Procedure: RIGHT LIMITED OPEN CARPAL TUNNEL RELEASE ,  RIGHT CUBITAL TUNNEL RELEASE, INSITU VERSES ULNAR NERVE DECOMPRESSION AND ANTERIOR  TRANSPOSITION;  Surgeon: [name], MD;  Location: [place];  Service: Orthopedics;  Laterality: Right;  . CESAREAN SECTION    . CHOLECYSTECTOMY    . CYST EXCISION  12/10/2012   THYROID  . INSERT / REPLACE / REMOVE PACEMAKER     2001  . IUD REMOVAL  11/03/2011   Procedure: INTRAUTERINE DEVICE (IUD) REMOVAL;  Surgeon: [name], MD;  Location: [facility];  Service: Gynecology;  Laterality: N/A;  . NASAL FRACTURE SURGERY     /w plate   . RIGHT HEART CATHETERIZATION N/A 10/26/2014   Procedure: RIGHT HEART CATH;  Surgeon: [name], MD;  Location[facility] C[facility] LAB;  Service: Cardiovascular;  Laterality: N/A;  . THROAT SURGERY  1994   s/p laser treatment  . THYROGLOSSAL DUCT CYST N/A 12/10/2012   Procedure: REVISION OF THYROGLOSSAL DUCT CYST EXCISION;  Surgeon: [name], MD;  Location: [place];  Service: ENT;  Laterality: N/A;  Revision of Thyroglossal Duct Cyst Excision  . VIDEO BRONCHOSCOPY Bilateral 06/19/2014   Procedure: VIDEO BRONCHOSCOPY WITHOUT FLUORO;  Surgeon: Brand Males, MD;  Location: [place];  Service: Cardiopulmonary;  Laterality: Bilateral;    OB History    Gravida Para Term Preterm AB Living   1 1 1     1    SAB TAB Ectopic Multiple Live Births                   Home Medications    Prior to Admission medications   Medication Sig Start Date End Date Taking? Authorizing Provider  Blood Glucose Monitoring Suppl (ONE TOUCH ULTRA 2) w/Device KIT Check blood sugar before meals and at bedtim 12/27/15   [name], PA-C  calcium-vitamin D (OSCAL WITH D) 500-200 MG-UNIT per tablet Take 1 tablet by mouth daily with breakfast. Reported on 04/15/2016    Historical Provider, MD  camphor-menthol Timoteo Ace) lotion Apply topically 2 (two) times daily. Patient not taking: Reported on 09/23/2016 12/26/15   [name], PA-C  fluocinonide (LIDEX) 0.05 % external solution 1 APPLICATION APPLY ON THE SKIN AS DIRECTED APPLY TO AFFECTED AREAS DAILY ON SCALP AS NEEDED FOR ITCHING.  09/28/15   Historical Provider, MD  fluticasone (FLONASE) 50 MCG/ACT nasal spray Place 2 sprays into both nostrils daily. 07/24/16   Vivi Barrack, MD  furosemide (LASIX) 20 MG tablet Take 1 tablet (20 mg total) by mouth every other day. 05/14/16   [name], MD  insulin aspart (NOVOLOG) 100 UNIT/ML FlexPen Inject 8 Units into the skin 3 (three) times daily with meals. 09/27/16   Eloise Levels, MD  Insulin Glargine (LANTUS SOLOSTAR) 100 UNIT/ML Solostar Pen Inject 35 Units into the skin daily. 10/06/16   Elberta Leatherwood, MD  loratadine (CLARITIN) 10 MG tablet Take 1 tablet (10 mg total) by mouth daily. Patient not taking: Reported on 09/23/2016 04/07/16   [name], DO  losartan (COZAAR)  25 MG tablet Take 0.5 tablets (12.5 mg total) by mouth daily. 09/29/16   Elsia Nigel Sloop, DO  mycophenolate (CELLCEPT) 500 MG tablet Take 750 mg by mouth 2 (two) times daily.    Historical Provider, MD  NONFORMULARY OR COMPOUNDED [place]:  Onychomycosis Nail Lacquer - Fluconazole 2%, Terbinafine 1%, apply to affected area daily. Patient not taking: Reported on 09/23/2016 06/04/16   Trula Slade, DPM  oxyCODONE-acetaminophen (PERCOCET/ROXICET) 5-325 MG tablet Take 1 tablet by mouth every 6 (six) hours as needed for severe pain. 10/16/16   Carlyle Dolly, MD  pantoprazole (PROTONIX) 40 MG tablet Take 1 tablet (40 mg total) by mouth daily. 04/07/16   [name], DO  polyethylene glycol (MIRALAX / GLYCOLAX) packet Take 17 g by mouth daily after supper. Patient taking differently: Take 17 g by mouth daily as needed for mild constipation.  12/26/15   [name], PA-C  predniSONE (DELTASONE) 5 MG tablet Take 17.5 mg by mouth daily with breakfast.     Historical Provider, MD  senna-docusate (SENOKOT-S) 8.6-50 MG tablet Take 3 tablets by mouth 2 (two) times daily as needed for mild constipation. 04/07/16   [name], DO  Vitamin D, Ergocalciferol, (DRISDOL) 50000  units CAPS capsule Take 50,000 Units by mouth every 7 (seven) days.    Historical Provider, MD    Family History Family History  Problem Relation Age of Onset  . Heart disease Mother     CHF (no details)  . Hypertension Mother   . Lupus Mother   . Heart disease Father     Murmur  . Heart disease Sister 38     No details.  History of a pacemaker    Social History Social History  Substance Use Topics  . Smoking status: Former Smoker    Packs/day: 0.25    Years: 0.50    Types: Cigarettes    Quit date: 07/25/1996  . Smokeless tobacco: Never Used  . Alcohol use No     Allergies   Atorvastatin; Sertraline hcl; and Tape   Review of Systems Review of Systems  Constitutional: Positive for fever. Negative for chills.  HENT: Positive for congestion, postnasal drip and rhinorrhea.   Respiratory: Positive for cough and shortness of breath.   Cardiovascular: Negative.   Gastrointestinal: Negative.   All other systems reviewed and are negative.    Physical Exam Triage Vital Signs ED Triage Vitals  Enc Vitals Group     BP      Pulse      Resp      Temp      Temp src      SpO2      Weight      Height      Head Circumference      Peak Flow      Pain Score      Pain Loc      Pain Edu?      Excl. in [place]?    No data found.   Updated Vital Signs There were no vitals taken for this visit.  Visual Acuity Right Eye Distance:   Left Eye Distance:   Bilateral Distance:    Right Eye Near:   Left Eye Near:    Bilateral Near:     Physical Exam  Constitutional: She is oriented to person, place, and time. She appears well-developed and well-nourished. No distress.  HENT:  Right Ear: External ear normal.  Left Ear: External ear  normal.  Nose: Mucosal edema and rhinorrhea present.  Mouth/Throat: Posterior oropharyngeal erythema present.  Neck: Normal range of motion. Neck supple.  Cardiovascular: Normal rate, regular rhythm, normal heart sounds and intact distal  pulses.   Pulmonary/Chest: Effort normal. She has decreased breath sounds. She has rhonchi.  On chronic home O2  Lymphadenopathy:    She has no cervical adenopathy.  Neurological: She is alert and oriented to person, place, and time.  Skin: Skin is warm and dry.  Nursing note and vitals reviewed.    UC Treatments / Results  Labs (all labs ordered are listed, but only abnormal results are displayed) Labs Reviewed - No data to display  EKG  EKG Interpretation None       Radiology No results found.  Procedures Procedures (including critical care time)  Medications Ordered in UC Medications - No data to display   Initial Impression / Assessment and Plan / UC Course  I have reviewed the triage vital signs and the nursing notes.  Pertinent labs & imaging results that were available during my care of the patient were reviewed by me and considered in my medical decision making (see chart for details).  Clinical Course       Final Clinical Impressions(s) / UC Diagnoses   Final diagnoses:  None    New Prescriptions New Prescriptions   No medications on file     Billy Fischer, MD 10/25/16 [place], MD 10/25/16 1530

## 2016-10-25 NOTE — Discharge Instructions (Signed)
Take all of medicine, drink lots of fluids, see your doctor if further problems °

## 2016-10-26 ENCOUNTER — Telehealth (HOSPITAL_COMMUNITY): Payer: Self-pay | Admitting: *Deleted

## 2016-10-26 NOTE — Telephone Encounter (Signed)
Rx for Avelox not available on pt's formulary.  Notified Dr. Artis Flock - V.O. Levaquin 500mg , take 1 tab PO QD, qty #7, no refills, in lieu of Avelox Rx.  Pharmacist verbalized understanding.

## 2016-10-27 ENCOUNTER — Ambulatory Visit: Payer: Medicare Other | Admitting: Internal Medicine

## 2016-10-28 NOTE — Telephone Encounter (Signed)
Pt canceled her appointment on 10/27/16 due to not feeling well. OV was rescheduled to 12/18/16.

## 2016-10-29 NOTE — Telephone Encounter (Signed)
Called Mandy with [facility] to see if appt scheduled for 12/18/2016 will be okay or if they need the notes now.  Per Angelica Chessman, okay to wait until March appt but the patient is not able to get POC until this note is done.   Please advise Dr Maple Hudson if you want to wait until March or if you are able to addend the note from June 2017 so that the patient can be set up with POC now. Thanks.

## 2016-10-30 NOTE — Telephone Encounter (Signed)
Ok to keep appointment in March. We can address O2 needs then

## 2016-10-30 NOTE — Telephone Encounter (Signed)
Called [facility] and spoke with [place]. She is aware that CY would like to wait until March to address this. Nothing further was needed.

## 2016-11-01 ENCOUNTER — Inpatient Hospital Stay (HOSPITAL_COMMUNITY)
Admission: EM | Admit: 2016-11-01 | Discharge: 2016-11-04 | DRG: 871 | Disposition: A | Discharge status: Home Health | Payer: Medicare Other | Attending: Family Medicine | Admitting: Family Medicine

## 2016-11-01 ENCOUNTER — Encounter (HOSPITAL_COMMUNITY): Payer: Self-pay

## 2016-11-01 ENCOUNTER — Emergency Department (HOSPITAL_COMMUNITY): Payer: Medicare Other

## 2016-11-01 DIAGNOSIS — R5381 Other malaise: Secondary | ICD-10-CM | POA: Diagnosis not present

## 2016-11-01 DIAGNOSIS — R05 Cough: Secondary | ICD-10-CM

## 2016-11-01 DIAGNOSIS — I5042 Chronic combined systolic (congestive) and diastolic (congestive) heart failure: Secondary | ICD-10-CM | POA: Diagnosis present

## 2016-11-01 DIAGNOSIS — E1165 Type 2 diabetes mellitus with hyperglycemia: Secondary | ICD-10-CM | POA: Diagnosis present

## 2016-11-01 DIAGNOSIS — J45909 Unspecified asthma, uncomplicated: Secondary | ICD-10-CM | POA: Diagnosis present

## 2016-11-01 DIAGNOSIS — Z79624 Long term (current) use of inhibitors of nucleotide synthesis: Secondary | ICD-10-CM

## 2016-11-01 DIAGNOSIS — A419 Sepsis, unspecified organism: Principal | ICD-10-CM | POA: Diagnosis present

## 2016-11-01 DIAGNOSIS — J189 Pneumonia, unspecified organism: Secondary | ICD-10-CM

## 2016-11-01 DIAGNOSIS — D72829 Elevated white blood cell count, unspecified: Secondary | ICD-10-CM

## 2016-11-01 DIAGNOSIS — F3170 Bipolar disorder, currently in remission, most recent episode unspecified: Secondary | ICD-10-CM | POA: Diagnosis present

## 2016-11-01 DIAGNOSIS — Z7952 Long term (current) use of systemic steroids: Secondary | ICD-10-CM

## 2016-11-01 DIAGNOSIS — Z950 Presence of cardiac pacemaker: Secondary | ICD-10-CM

## 2016-11-01 DIAGNOSIS — IMO0002 Reserved for concepts with insufficient information to code with codable children: Secondary | ICD-10-CM

## 2016-11-01 DIAGNOSIS — Z87891 Personal history of nicotine dependence: Secondary | ICD-10-CM | POA: Diagnosis not present

## 2016-11-01 DIAGNOSIS — Z794 Long term (current) use of insulin: Secondary | ICD-10-CM

## 2016-11-01 DIAGNOSIS — I959 Hypotension, unspecified: Secondary | ICD-10-CM | POA: Diagnosis present

## 2016-11-01 DIAGNOSIS — Z79899 Other long term (current) drug therapy: Secondary | ICD-10-CM

## 2016-11-01 DIAGNOSIS — E785 Hyperlipidemia, unspecified: Secondary | ICD-10-CM | POA: Diagnosis present

## 2016-11-01 DIAGNOSIS — M329 Systemic lupus erythematosus, unspecified: Secondary | ICD-10-CM | POA: Diagnosis not present

## 2016-11-01 DIAGNOSIS — I4581 Long QT syndrome: Secondary | ICD-10-CM

## 2016-11-01 DIAGNOSIS — E872 Acidosis, unspecified: Secondary | ICD-10-CM

## 2016-11-01 DIAGNOSIS — J159 Unspecified bacterial pneumonia: Secondary | ICD-10-CM | POA: Diagnosis present

## 2016-11-01 DIAGNOSIS — R509 Fever, unspecified: Secondary | ICD-10-CM

## 2016-11-01 DIAGNOSIS — Z9981 Dependence on supplemental oxygen: Secondary | ICD-10-CM | POA: Diagnosis not present

## 2016-11-01 DIAGNOSIS — I110 Hypertensive heart disease with heart failure: Secondary | ICD-10-CM | POA: Diagnosis present

## 2016-11-01 DIAGNOSIS — Z7951 Long term (current) use of inhaled steroids: Secondary | ICD-10-CM | POA: Diagnosis not present

## 2016-11-01 DIAGNOSIS — R058 Other specified cough: Secondary | ICD-10-CM

## 2016-11-01 DIAGNOSIS — J849 Interstitial pulmonary disease, unspecified: Secondary | ICD-10-CM | POA: Diagnosis present

## 2016-11-01 DIAGNOSIS — K219 Gastro-esophageal reflux disease without esophagitis: Secondary | ICD-10-CM | POA: Diagnosis present

## 2016-11-01 LAB — CBC WITH DIFFERENTIAL/PLATELET
BASOS PCT: 1 %
Basophils Absolute: 0.1 10*3/uL (ref 0.0–0.1)
EOS ABS: 0.2 10*3/uL (ref 0.0–0.7)
Eosinophils Relative: 2 %
HCT: 36.1 % (ref 36.0–46.0)
Hemoglobin: 11.4 g/dL — ABNORMAL LOW (ref 12.0–15.0)
Lymphocytes Relative: 14 %
Lymphs Abs: 1.7 10*3/uL (ref 0.7–4.0)
MCH: 27.1 pg (ref 26.0–34.0)
MCHC: 31.6 g/dL (ref 30.0–36.0)
MCV: 85.7 fL (ref 78.0–100.0)
Monocytes Absolute: 0.4 10*3/uL (ref 0.1–1.0)
Monocytes Relative: 3 %
NEUTROS ABS: 10 10*3/uL — AB (ref 1.7–7.7)
NEUTROS PCT: 80 %
Platelets: 483 10*3/uL — ABNORMAL HIGH (ref 150–400)
RBC: 4.21 MIL/uL (ref 3.87–5.11)
RDW: 15.5 % (ref 11.5–15.5)
WBC: 12.4 10*3/uL — AB (ref 4.0–10.5)

## 2016-11-01 LAB — I-STAT CG4 LACTIC ACID, ED
LACTIC ACID, VENOUS: 1.44 mmol/L (ref 0.5–1.9)
LACTIC ACID, VENOUS: 1.21 mmol/L (ref 0.5–1.9)
Lactic Acid, Venous: 3.18 mmol/L (ref 0.5–1.9)

## 2016-11-01 LAB — PROTIME-INR
INR: 1.29
Prothrombin Time: 16.2 seconds — ABNORMAL HIGH (ref 11.4–15.2)

## 2016-11-01 LAB — INFLUENZA PANEL BY PCR (TYPE A & B)
INFLAPCR: NEGATIVE
INFLBPCR: NEGATIVE

## 2016-11-01 LAB — COMPREHENSIVE METABOLIC PANEL WITH GFR
ALT: 21 U/L (ref 14–54)
AST: 57 U/L — ABNORMAL HIGH (ref 15–41)
Albumin: 2.5 g/dL — ABNORMAL LOW (ref 3.5–5.0)
Alkaline Phosphatase: 70 U/L (ref 38–126)
Anion gap: 16 — ABNORMAL HIGH (ref 5–15)
BUN: 6 mg/dL (ref 6–20)
CO2: 23 mmol/L (ref 22–32)
Calcium: 9.3 mg/dL (ref 8.9–10.3)
Chloride: 97 mmol/L — ABNORMAL LOW (ref 101–111)
Creatinine, Ser: 0.68 mg/dL (ref 0.44–1.00)
GFR calc Af Amer: >60 mL/min (ref >60)
GFR calc non Af Amer: >60 mL/min (ref >60)
Glucose, Bld: 181 mg/dL — ABNORMAL HIGH (ref 65–99)
Potassium: 4 mmol/L (ref 3.5–5.1)
Sodium: 136 mmol/L (ref 135–145)
Total Bilirubin: 0.8 mg/dL (ref 0.3–1.2)
Total Protein: 7.8 g/dL (ref 6.5–8.1)

## 2016-11-01 LAB — APTT: aPTT: 44 seconds — ABNORMAL HIGH (ref 24–36)

## 2016-11-01 LAB — PROCALCITONIN: Procalcitonin: 1.91 ng/mL

## 2016-11-01 MED ORDER — PIPERACILLIN-TAZOBACTAM 3.375 G IVPB
3.3750 g | Freq: Three times a day (TID) | INTRAVENOUS | Status: DC
  Administered 2016-11-02 – 2016-11-03 (×4): 3.375 g via INTRAVENOUS
  Filled 2016-11-01 (×6): qty 50

## 2016-11-01 MED ORDER — BENZONATATE 100 MG PO CAPS
100.0000 mg | ORAL_CAPSULE | Freq: Once | ORAL | Status: AC
  Administered 2016-11-01: 100 mg via ORAL
  Filled 2016-11-01: qty 1

## 2016-11-01 MED ORDER — VANCOMYCIN HCL IN DEXTROSE 1-5 GM/200ML-% IV SOLN
1000.0000 mg | Freq: Once | INTRAVENOUS | Status: AC
  Administered 2016-11-01: 1000 mg via INTRAVENOUS
  Filled 2016-11-01: qty 200

## 2016-11-01 MED ORDER — ENOXAPARIN SODIUM 40 MG/0.4ML ~~LOC~~ SOLN
40.0000 mg | SUBCUTANEOUS | Status: DC
  Administered 2016-11-02 – 2016-11-04 (×3): 40 mg via SUBCUTANEOUS
  Filled 2016-11-01 (×3): qty 0.4

## 2016-11-01 MED ORDER — ONDANSETRON HCL 4 MG PO TABS: 4.0000 mg | ORAL_TABLET | Freq: Four times a day (QID) | ORAL | Status: DC | PRN

## 2016-11-01 MED ORDER — CALCIUM CARBONATE-VITAMIN D 500-200 MG-UNIT PO TABS
1.0000 | ORAL_TABLET | Freq: Every day | ORAL | Status: DC
  Administered 2016-11-02 – 2016-11-04 (×3): 1 via ORAL
  Filled 2016-11-01 (×3): qty 1

## 2016-11-01 MED ORDER — SODIUM CHLORIDE 0.9 % IV BOLUS (SEPSIS)
1000.0000 mL | Freq: Once | INTRAVENOUS | Status: AC
  Administered 2016-11-01: 1000 mL via INTRAVENOUS

## 2016-11-01 MED ORDER — PANTOPRAZOLE SODIUM 40 MG PO TBEC
40.0000 mg | DELAYED_RELEASE_TABLET | Freq: Every day | ORAL | Status: DC
  Administered 2016-11-02 – 2016-11-04 (×3): 40 mg via ORAL
  Filled 2016-11-01 (×3): qty 1

## 2016-11-01 MED ORDER — ACETAMINOPHEN 325 MG PO TABS: 650.0000 mg | ORAL_TABLET | Freq: Four times a day (QID) | ORAL | Status: DC | PRN

## 2016-11-01 MED ORDER — INSULIN ASPART 100 UNIT/ML ~~LOC~~ SOLN
0.0000 [IU] | SUBCUTANEOUS | Status: DC
  Administered 2016-11-02: 2 [IU] via SUBCUTANEOUS
  Administered 2016-11-02: 1 [IU] via SUBCUTANEOUS
  Administered 2016-11-02 – 2016-11-03 (×3): 2 [IU] via SUBCUTANEOUS
  Administered 2016-11-03: 3 [IU] via SUBCUTANEOUS
  Filled 2016-11-01: qty 1

## 2016-11-01 MED ORDER — VANCOMYCIN HCL IN DEXTROSE 750-5 MG/150ML-% IV SOLN
750.0000 mg | Freq: Three times a day (TID) | INTRAVENOUS | Status: DC
  Administered 2016-11-02 – 2016-11-03 (×4): 750 mg via INTRAVENOUS
  Filled 2016-11-01 (×7): qty 150

## 2016-11-01 MED ORDER — POLYETHYLENE GLYCOL 3350 17 G PO PACK
17.0000 g | PACK | Freq: Every day | ORAL | Status: DC | PRN
  Filled 2016-11-01: qty 1

## 2016-11-01 MED ORDER — ONDANSETRON HCL 4 MG/2ML IJ SOLN: 4.0000 mg | Freq: Four times a day (QID) | INTRAMUSCULAR | Status: DC | PRN

## 2016-11-01 MED ORDER — PREDNISONE 5 MG PO TABS
17.5000 mg | ORAL_TABLET | Freq: Every day | ORAL | Status: DC
  Administered 2016-11-02: 17.5 mg via ORAL
  Filled 2016-11-01: qty 1

## 2016-11-01 MED ORDER — OXYCODONE-ACETAMINOPHEN 5-325 MG PO TABS
1.0000 | ORAL_TABLET | Freq: Four times a day (QID) | ORAL | Status: DC | PRN
  Administered 2016-11-01 – 2016-11-03 (×2): 1 via ORAL
  Filled 2016-11-01 (×2): qty 1

## 2016-11-01 MED ORDER — SODIUM CHLORIDE 0.9% FLUSH
3.0000 mL | Freq: Two times a day (BID) | INTRAVENOUS | Status: DC
  Administered 2016-11-03 – 2016-11-04 (×2): 3 mL via INTRAVENOUS

## 2016-11-01 MED ORDER — VITAMIN D (ERGOCALCIFEROL) 1.25 MG (50000 UNIT) PO CAPS
50000.0000 [IU] | ORAL_CAPSULE | ORAL | Status: DC
  Administered 2016-11-03: 50000 [IU] via ORAL
  Filled 2016-11-01: qty 1

## 2016-11-01 MED ORDER — PIPERACILLIN-TAZOBACTAM 3.375 G IVPB 30 MIN
3.3750 g | Freq: Once | INTRAVENOUS | Status: AC
  Administered 2016-11-01: 3.375 g via INTRAVENOUS
  Filled 2016-11-01: qty 50

## 2016-11-01 MED ORDER — IPRATROPIUM BROMIDE 0.06 % NA SOLN
2.0000 | Freq: Four times a day (QID) | NASAL | Status: DC
  Administered 2016-11-02 – 2016-11-04 (×8): 2 via NASAL
  Filled 2016-11-01: qty 15

## 2016-11-01 MED ORDER — SODIUM CHLORIDE 0.9 % IV SOLN
INTRAVENOUS | Status: DC
  Administered 2016-11-01 – 2016-11-03 (×3): via INTRAVENOUS
  Administered 2016-11-03: 125 mL/h via INTRAVENOUS

## 2016-11-01 MED ORDER — ACETAMINOPHEN 650 MG RE SUPP: 650.0000 mg | Freq: Four times a day (QID) | RECTAL | Status: DC | PRN

## 2016-11-01 NOTE — ED Provider Notes (Signed)
[facility] DEPT Provider Note  CSN: [id number] Arrival date & time: 11/01/16  1708   History   Chief Complaint Chief Complaint  Patient presents with  . shortness of breath/weakness    HPI Makayla Vasquez is a 36 y.o. female.  The history is provided by the patient and medical records. No language interpreter was used.  Illness  This is a new problem. The current episode started more than 1 week ago. The problem occurs constantly. The problem has been gradually worsening. Associated symptoms include shortness of breath. Pertinent negatives include no chest pain, no abdominal pain and no headaches. Nothing aggravates the symptoms. Nothing relieves the symptoms. She has tried nothing for the symptoms.    Past Medical History:  Diagnosis Date  . Anxiety   . Arthritis    rheumatoid arthritis- mild, no rheumatology care   . Asthma    seasonal allergies   . Asymptomatic LV dysfunction    a. Echo in Dec 2011 with EF 35 to 40%. Felt to be due to paced rhythm. b. EF 25-30% in 07/2014.  . Bipolar affective disorder ([place])   . Cardiac pacemaker    a. Since age 3 in 50. b. Upgrade to BiV in 2013.  [name] Carpal tunnel syndrome of right wrist   . CHF (congestive heart failure) ([place])   . Congenital complete AV block   . Depression    bipolar  . Diabetes mellitus without complication ([place])   . GERD (gastroesophageal reflux disease)   . Hypertension   . Lupus   . Lupus (systemic lupus erythematosus) ([place])   . Obesity   . Pneumonitis    a. a/w hypoxia - inflammatory - large workup 07/2014.  . Presence of permanent cardiac pacemaker   . Seizures ([place])    as a child- from high fever  . Sinus tachycardia    Patient Active Problem List   Diagnosis Date Noted  . Right knee pain 10/16/2016  . Long term (current) use of systemic steroids   . History of heart failure   . Hyperglycemia 09/23/2016  . Breast pain, right 04/14/2016  . Chronic diastolic congestive heart failure ([place])   .  Bacteremia 02/24/2016  . Myalgia and myositis   . Type 2 diabetes mellitus with hyperglycemia, with long-term current use of insulin ([place])   . Anemia of chronic disease   . Bipolar affective disorder in remission ([place])   . Generalized weakness   . Acute blood loss anemia   . Cardiac pacemaker in situ   . Adrenal insufficiency ([place])   . Acute on chronic diastolic CHF (congestive heart failure) ([place])   . 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 Hypotension 07/08/2015  . Hyperlipidemia associated with type 2 diabetes mellitus ([place]) 05/04/2015  . Screening for cervical cancer 01/19/2015  . Onychomycosis 12/14/2014  . CHF (congestive heart failure) ([place])   . Diabetes mellitus type 2 in obese ([place])   . Chronic respiratory failure with hypoxia ([place]) 08/07/2014  . Insertion of implantable subdermal contraceptive 07/12/2014  . ILD (interstitial lung disease) ([place]) 06/19/2014  . Congestive dilated cardiomyopathy ([place]) 05/31/2014  . Vaginal itching 05/22/2014  . Constipated 03/03/2012  . Chronic systolic heart failure ([place]) 02/26/2012  . Intermittent abdominal pain 02/18/2012  . PPM-Medtronic   . Congenital complete AV block   . GERD (gastroesophageal reflux disease)   . THYROGLOSSAL DUCT CYST 07/18/2010  . RH FACTOR, NEGATIVE 04/08/2010  . Mild intermittent asthma without complication 77/41/2878  . POLYCYSTIC OVARY 12/17/2006  Past Surgical History:  Procedure Laterality Date  . ATRIAL TACH ABLATION N/A 08/14/2014   Procedure: ATRIAL TACH ABLATION;  Surgeon: [name], MD;  Location: [facility] CATH LAB;  Service: Cardiovascular;  Laterality: N/A;  . BI-VENTRICULAR PACEMAKER UPGRADE N/A 03/08/2012   Procedure: BI-VENTRICULAR PACEMAKER UPGRADE;  Surgeon: [name], MD;  Location: [facility] CATH LAB;  Service: Cardiovascular;  Laterality: N/A;  . CARPAL TUNNEL WITH CUBITAL TUNNEL Right 07/26/2013   Procedure: RIGHT LIMITED OPEN CARPAL TUNNEL RELEASE ,  RIGHT CUBITAL TUNNEL RELEASE, INSITU VERSES ULNAR  NERVE DECOMPRESSION AND ANTERIOR TRANSPOSITION;  Surgeon: [name], MD;  Location: [facility];  Service: Orthopedics;  Laterality: Right;  . CESAREAN SECTION    . CHOLECYSTECTOMY    . CYST EXCISION  12/10/2012   THYROID  . INSERT / REPLACE / REMOVE PACEMAKER     2001  . IUD REMOVAL  11/03/2011   Procedure: INTRAUTERINE DEVICE (IUD) REMOVAL;  Surgeon: [name], MD;  Location: [facility];  Service: Gynecology;  Laterality: N/A;  . NASAL FRACTURE SURGERY     /w plate   . RIGHT HEART CATHETERIZATION N/A 10/26/2014   Procedure: RIGHT HEART CATH;  Surgeon: [name], MD;  Location[facility] LAB;  Service: Cardiovascular;  Laterality: N/A;  . THROAT SURGERY  1994   s/p laser treatment  . THYROGLOSSAL DUCT CYST N/A 12/10/2012   Procedure: REVISION OF THYROGLOSSAL DUCT CYST EXCISION;  Surgeon: [name], MD;  Location: [facility];  Service: ENT;  Laterality: N/A;  Revision of Thyroglossal Duct Cyst Excision  . VIDEO BRONCHOSCOPY Bilateral 06/19/2014   Procedure: VIDEO BRONCHOSCOPY WITHOUT FLUORO;  Surgeon: Brand Males, MD;  Location: [facility];  Service: Cardiopulmonary;  Laterality: Bilateral;   OB History    Gravida Para Term Preterm AB Living   1 1 1     1    SAB TAB Ectopic Multiple Live Births                  Home Medications    Prior to Admission medications   Medication Sig Start Date End Date Taking? Authorizing Provider  Blood Glucose Monitoring Suppl (ONE TOUCH ULTRA 2) w/Device KIT Check blood sugar before meals and at bedtim 12/27/15   [name], PA-C  calcium-vitamin D (OSCAL WITH D) 500-200 MG-UNIT per tablet Take 1 tablet by mouth daily with breakfast. Reported on 04/15/2016    Historical Provider, MD  camphor-menthol Timoteo Ace) lotion Apply topically 2 (two) times daily. Patient not taking: Reported on 09/23/2016 12/26/15   [name], PA-C  fluocinonide (LIDEX) 0.05 % external solution 1 APPLICATION APPLY ON THE SKIN AS DIRECTED APPLY TO AFFECTED AREAS DAILY ON SCALP  AS NEEDED FOR ITCHING. 09/28/15   Historical Provider, MD  fluticasone (FLONASE) 50 MCG/ACT nasal spray Place 2 sprays into both nostrils daily. 07/24/16   Vivi Barrack, MD  furosemide (LASIX) 20 MG tablet Take 1 tablet (20 mg total) by mouth every other day. 05/14/16   [name], MD  guaiFENesin-codeine [facility]) 100-10 MG/5ML syrup Take 10 mLs by mouth 4 (four) times daily as needed for cough. 10/25/16   Billy Fischer, MD  insulin aspart (NOVOLOG) 100 UNIT/ML FlexPen Inject 8 Units into the skin 3 (three) times daily with meals. 09/27/16   Eloise Levels, MD  Insulin Glargine (LANTUS SOLOSTAR) 100 UNIT/ML Solostar Pen Inject 35 Units into the skin daily. 10/06/16   Elberta Leatherwood, MD  ipratropium (ATROVENT) 0.06 % nasal  spray Place 2 sprays into both nostrils 4 (four) times daily. 10/25/16   Billy Fischer, MD  loratadine (CLARITIN) 10 MG tablet Take 1 tablet (10 mg total) by mouth daily. Patient not taking: Reported on 09/23/2016 04/07/16   [name], DO  losartan (COZAAR) 25 MG tablet Take 0.5 tablets (12.5 mg total) by mouth daily. 09/29/16   Elsia Nigel Sloop, DO  moxifloxacin (AVELOX) 400 MG tablet Take 1 tablet (400 mg total) by mouth daily. One tab daily 10/25/16   Billy Fischer, MD  mycophenolate (CELLCEPT) 500 MG tablet Take 750 mg by mouth 2 (two) times daily.    Historical Provider, MD  NONFORMULARY OR COMPOUNDED [place]:  Onychomycosis Nail Lacquer - Fluconazole 2%, Terbinafine 1%, apply to affected area daily. Patient not taking: Reported on 09/23/2016 06/04/16   Trula Slade, DPM  oxyCODONE-acetaminophen (PERCOCET/ROXICET) 5-325 MG tablet Take 1 tablet by mouth every 6 (six) hours as needed for severe pain. 10/16/16   Carlyle Dolly, MD  pantoprazole (PROTONIX) 40 MG tablet Take 1 tablet (40 mg total) by mouth daily. 04/07/16   [name], DO  polyethylene glycol (MIRALAX / GLYCOLAX) packet Take 17 g by mouth daily after supper. Patient  taking differently: Take 17 g by mouth daily as needed for mild constipation.  12/26/15   [name], PA-C  predniSONE (DELTASONE) 5 MG tablet Take 17.5 mg by mouth daily with breakfast.     Historical Provider, MD  senna-docusate (SENOKOT-S) 8.6-50 MG tablet Take 3 tablets by mouth 2 (two) times daily as needed for mild constipation. 04/07/16   [name], DO  Vitamin D, Ergocalciferol, (DRISDOL) 50000 units CAPS capsule Take 50,000 Units by mouth every 7 (seven) days.    Historical Provider, MD   Family History Family History  Problem Relation Age of Onset  . Heart disease Mother     CHF (no details)  . Hypertension Mother   . Lupus Mother   . Heart disease Father     Murmur  . Heart disease Sister 72     No details.  History of a pacemaker   Social History Social History  Substance Use Topics  . Smoking status: Former Smoker    Packs/day: 0.25    Years: 0.50    Types: Cigarettes    Quit date: 07/25/1996  . Smokeless tobacco: Never Used  . Alcohol use No    Allergies   Atorvastatin; Sertraline hcl; and Tape   Review of Systems Review of Systems  Constitutional: Positive for appetite change (decreased), chills, fatigue and fever.  HENT: Positive for congestion.   Respiratory: Positive for cough (productive w/ yellow sputum) and shortness of breath. Negative for wheezing.   Cardiovascular: Negative for chest pain.  Gastrointestinal: Positive for nausea and vomiting (NBNB). Negative for abdominal pain and diarrhea.  Genitourinary: Negative for dysuria, frequency and hematuria.  Neurological: Negative for headaches.  All other systems reviewed and are negative.   Physical Exam Updated Vital Signs BP 99/65   Pulse (!) 131   Temp 99.1 F (37.3 C)   Resp (!) 36   Ht 5' 5"  (1.651 m)   Wt 66.7 kg   SpO2 99%   BMI 24.46 kg/m   Physical Exam  Constitutional: She is oriented to person, place, and time. No distress.  Young AA female  HENT:  Head:  Normocephalic and atraumatic.  Eyes: EOM are normal. Pupils are equal, round, and reactive to light.  Neck:  Normal range of motion. Neck supple.  Cardiovascular: Regular rhythm and normal heart sounds.  Tachycardia present.   Pulmonary/Chest: No respiratory distress.  Tachypnea, mild increased work of breathing, coarse breath sounds bilateral bases, maintaining sats in the mid 90's on [place]  Abdominal: Soft. Bowel sounds are normal. She exhibits no distension. There is no tenderness.  Musculoskeletal: Normal range of motion.  Neurological: She is alert and oriented to person, place, and time.  Skin: Skin is warm and dry. Capillary refill takes less than 2 seconds. She is not diaphoretic.  Nursing note and vitals reviewed.   ED Treatments / Results  Labs (all labs ordered are listed, but only abnormal results are displayed) Labs Reviewed  COMPREHENSIVE METABOLIC PANEL - Abnormal; Notable for the following:       Result Value   Chloride 97 (*)    Glucose, Bld 181 (*)    Albumin 2.5 (*)    AST 57 (*)    Anion gap 16 (*)    All other components within normal limits  CBC WITH DIFFERENTIAL/PLATELET - Abnormal; Notable for the following:    WBC 12.4 (*)    Hemoglobin 11.4 (*)    Platelets 483 (*)    Neutro Abs 10.0 (*)    All other components within normal limits  I-STAT CG4 LACTIC ACID, ED - Abnormal; Notable for the following:    Lactic Acid, Venous 3.18 (*)    All other components within normal limits  CULTURE, BLOOD (ROUTINE X 2)  CULTURE, BLOOD (ROUTINE X 2)  URINE CULTURE  URINALYSIS, ROUTINE W REFLEX MICROSCOPIC  INFLUENZA PANEL BY PCR (TYPE A & B, H1N1)    EKG  EKG Interpretation  Date/Time:  Saturday November 01 2016 17:43:58 EST Ventricular Rate:  138 PR Interval:    QRS Duration: 119 QT Interval:  332 QTC Calculation: 504 R Axis:   -154 Text Interpretation:  Right and left arm electrode reversal, interpretation assumes no reversal Sinus tachycardia Nonspecific  intraventricular conduction delay Inferolateral infarct, old Probable anteroseptal infarct, recent Similar to prior tracing. No STEMI.  Confirmed by LONG MD, JOSHUA [phone]) on 11/01/2016 5:51:45 PM      Radiology Dg Chest 2 View  Result Date: 11/01/2016 CLINICAL DATA:  Acute onset of fever, hypoxia and shortness of breath. Assess loss productive cough. Initial encounter. EXAM: CHEST  2 VIEW COMPARISON:  Chest radiograph performed 09/23/2016 FINDINGS: The lungs are well-aerated. Vascular congestion is noted. Increased interstitial markings may reflect mild interstitial edema. Given the patient's symptoms, pneumonia could have a similar appearance. There is no evidence of pleural effusion or pneumothorax. The heart is borderline normal in size. A pacemaker is noted overlying the left chest wall, with leads ending overlying the right atrium, right ventricle and coronary sinus. No acute osseous abnormalities are seen. Clips are noted within the right upper quadrant, reflecting prior cholecystectomy. IMPRESSION: Vascular congestion noted. Increased interstitial markings may reflect mild interstitial edema. Given the patient's symptoms, pneumonia could have a similar appearance. Electronically Signed   By: Garald Balding M.D.   On: 11/01/2016 19:05    Procedures Procedures (including critical care time)  Medications Ordered in ED Medications  vancomycin (VANCOCIN) IVPB 1000 mg/200 mL premix (not administered)  sodium chloride 0.9 % bolus 1,000 mL (not administered)  sodium chloride 0.9 % bolus 1,000 mL (1,000 mLs Intravenous New Bag/Given 11/01/16 1815)  piperacillin-tazobactam (ZOSYN) IVPB 3.375 g (3.375 g Intravenous New Bag/Given 11/01/16 1905)    Initial Impression / Assessment and Plan /  ED Course  I have reviewed the triage vital signs and the nursing notes.  Clinical Course   36 y.o. female with above stated PMHx, HPI, and physical. PMHx of asthma, AV block (s/p PPM), CHF (EF 50-55% 08/17,  lasix), HTN, IDDM, & Lupus (Cellcept & prednisone). Symptom onset of her 1.5 weeks. Fever, chills, productive cough with yellow sputum, nausea, nonbloody bilious emesis.  HR 140's, tachypneic, temp 99.21F. Concern for sepsis. Blood & urine cx's obtained. Given IVF's. CXR showing increased interstitial lung markings which may reflect interstitial edema versus pneumonia. Patient covered with broad-spectrum antibiotics including vancomycin and Zosyn. Labs notable for leukocytosis and lactic acid of 3.  Laboratory and imaging results were personally reviewed by myself and used in the medical decision making of this patient's treatment and disposition.   Pt admitted to family medicine for further evaluation and management of sepsis or presumed pulmonary source. Pt understands and agrees with the plan and has no further questions or concerns.   Pt care discussed with and followed by my attending, Dr. Karn Cassis, MD Pager [phone]  ED Disposition    ED Disposition Condition Comment   Admit  The patient appears reasonably stabilized for admission considering the current resources, flow, and capabilities available in the ED at this time, and I doubt any other [facility] requiring further screening and/or treatment in the ED prior to admission is  present.       Final Clinical Impressions(s) / ED Diagnoses   Final diagnoses:  Fever in adult  Productive cough  On Cellcept therapy  On prednisone therapy  Community acquired pneumonia  Sepsis ([place])  Lactic acidosis  Leukocytosis    New Prescriptions New Prescriptions   No medications on file     Mayer Camel, MD 11/01/16 [place], MD 11/02/16 1158

## 2016-11-01 NOTE — H&P (Signed)
[facility] Admission History and Physical Service Pager: [phone]  Patient name: Makayla Vasquez Medical record number: [id number] Date of birth: 1981-05-09 Age: 36 y.o. Gender: female  Primary Care Provider: Adin Hector, MD Consultants: None  Code Status: Full  Chief Complaint: Cough and weakness  Assessment and Plan: [name] is a 37 y.o. female presenting with productive cough and dyspnea. PMH is significant for HFpEF (EF 50-55% 05/2016), congenital AV block (s/p BV pacemaker), DM on insulin, Lupus (on cellcept and prednisone), HTN, and asthma.  #Community-acquired pneumonia meeting sepsis criteria: Acute, stable. Patient presenting with history of URI 2 weeks ago with increased oxygen requirement, purulent productive cough. Chest x-ray shows interstitial findings concerning for possible pneumonia. Patient is afebrile on presentation but tachycardic to 137, with a mild leukocytosis to 12.4 and a lactic acidosis to 3.8. Code sepsis was called. Blood cultures and urine cultures were obtained. Patient was started on vanc/zosyn for suspected CAP. Does have known ILD but CXR with increased interstitial markings. No increase from home O2 requirements (2-4L for ILD), stable on 3 L [place]. Patient's blood pressures have been around 100/60s, was given 2 L bolus in ED. Patient has coarse breath sounds on exam but does not appear fluid overloaded, without edema. --Will admit to SDU given persistent tachycardia, attending Dr. Erin Hearing --Vanc/zosyn initiated in ED for CAP (day 1, 1/13>) to cover for pseudomonas and MRSA given chronic lung disease  --Would de-escalate to single agent like levaquin due to medication compliance issues if improved 1/14 --Trending lactic acid --Blood cultures 2 pending --UA and urine culture pending --Influenza panel negative; d/c droplet precautions --Continuous pulse ox --Zofran PRN --Protonix 40 mg daily --AM CBC and  BMET --Incentive spirometry --Procalcitonin  #HFpEF with congenital AV block and BV pacemaker: Chronic, stable. Patient's last echo on 05/2017 with EF of 50-55%. Patient tachycardic on arrival to ED with low BPs. EKG consistent with sinus tach without ST changes or T-wave abnormalities. Does not appear to be in fluid overload at this time. Have bolused in ED for concerns of sepsis in the setting of a CAP. --Holding home Lasix 20 mg daily given low BP --Holding home Cozaar 25 mg daily given low BP  #Diabetes mellitus with insulin dependence: Chronic, poorly controlled. Patient takes Lantus 35 units in AM and NovoLog 8 units TID with meals at home. Patient has not taken these medications and past week due to low blood sugars secondary to poor PO intake. Patient endorses CBGs ranging 70-100. Last A1c on 09/2017 was >15. Was admitted for hyperglycemia > 500 December 2017. --Holding home Lantus 35 units daily and NovoLog 8 units 3 times daily with meals given decreased PO intake --Sensitive sliding scale; increase as needed --CBG every 4 hours, will adjust regimen as needed  #Lupus: Chronic. Patient on cellcept and prednisone at home. Patient endorses not taking these medications over the past week due to decreased PO intake/feelling overwhelmed by changes in medications after last hospitalization. Will restart prednisone now. Takes Percocet 5-325 mg every 6 hours for pain when necessary at home. --Home prednisone 5 mg daily --Holding cellcept for now during acute infection, as PNA is a rare side effect of this medication --Home Percocet 5-325 mg every 6 hours for pain when necessary  #Hypertension: Chronic, currently hypotensive. Patient takes Cozaar at home, but has not taken this medication the last week. --Holding home Cozaar 25 mg daily given low BP  #Asthma: Chronic, stable. Patient wheezing on exam. We'll  continue to monitor for signs of asthma exacerbation. --Duonebs q2h prn  FEN/GI:  Prior modified diet, MIVF and bolus as needed Prophylaxis: Lovenox SQ  Disposition: Pending improvement of suspected pneumonia  History of Present Illness:  Makayla Vasquez is a 36 y.o. female presenting with productive cough and dyspnea. PMH is significant for HFpEF (EF 50-55% 05/2016), congenital AV block (s/p BV pacemaker), DM on insulin, Lupus (on cellcept and prednisone), HTN, and asthma.  Onset 2 weeks ago when patient experienced URI symptoms including productive sore throat, cough, congestion, runny nose. Patient states she continues to cough up clear phlegm. Other signs and symptoms include night sweats and chills but patient uncertain about fever stating she is unable to take her temperature. She went to urgent care last Saturday and was given Robitussin with codeine, nasal spray, and Levaquin which she did not take (says no one explained why she needed antibiotics, so she was not comfortable taking it). Patient continues to feel fatigued with muscle aches and states "all I want to do is sleep." She denies symptoms of chest pain, palpitations, nausea or vomiting, diarrhea. Patient is on home oxygen at 2-4 L [place]. She also has decreased PO intake but is able to drink liquids. She states she was not taking several of her medications including her cellcept, prednisone, Cozaar, and insulin (states she has been checking her morning sugars which have been under 100 but no lower than 70). She has a 16-year-old son at home who has experienced similar URI symptoms earlier this week.  Review Of Systems: Complete ROS performed. Refer to HPI for pertinent.  Review of Systems  Constitutional: Positive for chills, diaphoresis and malaise/fatigue. Negative for fever.  HENT: Positive for congestion and sore throat.   Respiratory: Positive for cough, sputum production and shortness of breath. Negative for hemoptysis and wheezing.   Cardiovascular: Negative for chest pain, palpitations and leg swelling.   Gastrointestinal: Positive for nausea. Negative for abdominal pain, diarrhea and vomiting.  Genitourinary: Negative for dysuria, frequency and urgency.  Musculoskeletal: Positive for myalgias. Negative for neck pain.  Skin: Negative for rash.  Neurological: Positive for weakness. Negative for dizziness and headaches.  Psychiatric/Behavioral: Negative for depression and substance abuse.    Patient Active Problem List   Diagnosis Date Noted  . Right knee pain 10/16/2016  . Long term (current) use of systemic steroids   . History of heart failure   . Hyperglycemia 09/23/2016  . Breast pain, right 04/14/2016  . Chronic diastolic congestive heart failure ([place])   . Bacteremia 02/24/2016  . Myalgia and myositis   . Type 2 diabetes mellitus with hyperglycemia, with long-term current use of insulin ([place])   . Anemia of chronic disease   . Bipolar affective disorder in remission ([place])   . Generalized weakness   . Acute blood loss anemia   . Cardiac pacemaker in situ   . Adrenal insufficiency ([place])   . Acute on chronic diastolic CHF (congestive heart failure) ([place])   . 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 Hypotension 07/08/2015  . Hyperlipidemia associated with type 2 diabetes mellitus ([place]) 05/04/2015  . Screening for cervical cancer 01/19/2015  . Onychomycosis 12/14/2014  . CHF (congestive heart failure) ([place])   . Diabetes mellitus type 2 in obese ([place])   . Chronic respiratory failure with hypoxia ([place]) 08/07/2014  . Insertion of implantable subdermal contraceptive 07/12/2014  . ILD (interstitial lung disease) ([place]) 06/19/2014  . Congestive dilated cardiomyopathy ([place]) 05/31/2014  . Vaginal itching 05/22/2014  .  Constipated 03/03/2012  . Chronic systolic heart failure ([place]) 02/26/2012  . Intermittent abdominal pain 02/18/2012  . PPM-Medtronic   . Congenital complete AV block   . GERD (gastroesophageal reflux disease)   . THYROGLOSSAL DUCT CYST 07/18/2010  . RH FACTOR, NEGATIVE  04/08/2010  . Mild intermittent asthma without complication 59/56/3875  . POLYCYSTIC OVARY 12/17/2006    Past Medical History: Past Medical History:  Diagnosis Date  . Anxiety   . Arthritis    rheumatoid arthritis- mild, no rheumatology care   . Asthma    seasonal allergies   . Asymptomatic LV dysfunction    a. Echo in Dec 2011 with EF 35 to 40%. Felt to be due to paced rhythm. b. EF 25-30% in 07/2014.  . Bipolar affective disorder ([place])   . Cardiac pacemaker    a. Since age 68 in 27. b. Upgrade to BiV in 2013.  [name] Carpal tunnel syndrome of right wrist   . CHF (congestive heart failure) ([place])   . Congenital complete AV block   . Depression    bipolar  . Diabetes mellitus without complication ([place])   . GERD (gastroesophageal reflux disease)   . Hypertension   . Lupus   . Lupus (systemic lupus erythematosus) ([place])   . Obesity   . Pneumonitis    a. a/w hypoxia - inflammatory - large workup 07/2014.  . Presence of permanent cardiac pacemaker   . Seizures ([place])    as a child- from high fever  . Sinus tachycardia     Past Surgical History: Past Surgical History:  Procedure Laterality Date  . ATRIAL TACH ABLATION N/A 08/14/2014   Procedure: ATRIAL TACH ABLATION;  Surgeon: [name], MD;  Location: [facility] CATH LAB;  Service: Cardiovascular;  Laterality: N/A;  . BI-VENTRICULAR PACEMAKER UPGRADE N/A 03/08/2012   Procedure: BI-VENTRICULAR PACEMAKER UPGRADE;  Surgeon: [name], MD;  Location: [facility] CATH LAB;  Service: Cardiovascular;  Laterality: N/A;  . CARPAL TUNNEL WITH CUBITAL TUNNEL Right 07/26/2013   Procedure: RIGHT LIMITED OPEN CARPAL TUNNEL RELEASE ,  RIGHT CUBITAL TUNNEL RELEASE, INSITU VERSES ULNAR NERVE DECOMPRESSION AND ANTERIOR TRANSPOSITION;  Surgeon: [name], MD;  Location: [facility];  Service: Orthopedics;  Laterality: Right;  . CESAREAN SECTION    . CHOLECYSTECTOMY    . CYST EXCISION  12/10/2012   THYROID  . INSERT / REPLACE / REMOVE PACEMAKER     2001   . IUD REMOVAL  11/03/2011   Procedure: INTRAUTERINE DEVICE (IUD) REMOVAL;  Surgeon: [name], MD;  Location: [facility];  Service: Gynecology;  Laterality: N/A;  . NASAL FRACTURE SURGERY     /w plate   . RIGHT HEART CATHETERIZATION N/A 10/26/2014   Procedure: RIGHT HEART CATH;  Surgeon: [name], MD;  Location: [facility] CATH LAB;  Service: Cardiovascular;  Laterality: N/A;  . THROAT SURGERY  1994   s/p laser treatment  . THYROGLOSSAL DUCT CYST N/A 12/10/2012   Procedure: REVISION OF THYROGLOSSAL DUCT CYST EXCISION;  Surgeon: [name], MD;  Location: [facility];  Service: ENT;  Laterality: N/A;  Revision of Thyroglossal Duct Cyst Excision  . VIDEO BRONCHOSCOPY Bilateral 06/19/2014   Procedure: VIDEO BRONCHOSCOPY WITHOUT FLUORO;  Surgeon: Brand Males, MD;  Location: [facility];  Service: Cardiopulmonary;  Laterality: Bilateral;    Social History: Social History  Substance Use Topics  . Smoking status: Former Smoker    Packs/day: 0.25    Years: 0.50    Types: Cigarettes    Quit date:  07/25/1996  . Smokeless tobacco: Never Used  . Alcohol use No   Additional social history: lives at home with 17 yo son significant other. Denies smoking, drugs, EtOH. Please also refer to relevant sections of EMR.  Family History: Family History  Problem Relation Age of Onset  . Heart disease Mother     CHF (no details)  . Hypertension Mother   . Lupus Mother   . Heart disease Father     Murmur  . Heart disease Sister 39     No details.  History of a pacemaker   Allergies and Medications: Allergies  Allergen Reactions  . 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 Sertraline Hcl Hives  . Tape Other (See Comments)    Burns skin   No current facility-administered medications on file prior to encounter.    Current Outpatient Prescriptions on File Prior to Encounter  Medication Sig Dispense Refill  . Blood Glucose  Monitoring Suppl (ONE TOUCH ULTRA 2) w/Device KIT Check blood sugar before meals and at bedtim 1 each 0  . calcium-vitamin D (OSCAL WITH D) 500-200 MG-UNIT per tablet Take 1 tablet by mouth daily with breakfast. Reported on 04/15/2016    . camphor-menthol (SARNA) lotion Apply topically 2 (two) times daily. (Patient not taking: Reported on 09/23/2016) 222 mL 0  . fluocinonide (LIDEX) 0.05 % external solution 1 APPLICATION APPLY ON THE SKIN AS DIRECTED APPLY TO AFFECTED AREAS DAILY ON SCALP AS NEEDED FOR ITCHING.  5  . fluticasone (FLONASE) 50 MCG/ACT nasal spray Place 2 sprays into both nostrils daily. 16 g 6  . furosemide (LASIX) 20 MG tablet Take 1 tablet (20 mg total) by mouth every other day. 30 tablet 5  . guaiFENesin-codeine (ROBITUSSIN AC) 100-10 MG/5ML syrup Take 10 mLs by mouth 4 (four) times daily as needed for cough. 180 mL 0  . insulin aspart (NOVOLOG) 100 UNIT/ML FlexPen Inject 8 Units into the skin 3 (three) times daily with meals. 15 mL 11  . Insulin Glargine (LANTUS SOLOSTAR) 100 UNIT/ML Solostar Pen Inject 35 Units into the skin daily. 15 mL 5  . ipratropium (ATROVENT) 0.06 % nasal spray Place 2 sprays into both nostrils 4 (four) times daily. 15 mL 1  . loratadine (CLARITIN) 10 MG tablet Take 1 tablet (10 mg total) by mouth daily. (Patient not taking: Reported on 09/23/2016) 30 tablet 5  . losartan (COZAAR) 25 MG tablet Take 0.5 tablets (12.5 mg total) by mouth daily.    [name] moxifloxacin (AVELOX) 400 MG tablet Take 1 tablet (400 mg total) by mouth daily. One tab daily 7 tablet 0  . mycophenolate (CELLCEPT) 500 MG tablet Take 750 mg by mouth 2 (two) times daily.    . NONFORMULARY OR COMPOUNDED ITEM [facility]:  Onychomycosis Nail Lacquer - Fluconazole 2%, Terbinafine 1%, apply to affected area daily. (Patient not taking: Reported on 09/23/2016) 120 each 2  . oxyCODONE-acetaminophen (PERCOCET/ROXICET) 5-325 MG tablet Take 1 tablet by mouth every 6 (six) hours as needed for severe pain.  30 tablet 0  . pantoprazole (PROTONIX) 40 MG tablet Take 1 tablet (40 mg total) by mouth daily. 30 tablet 5  . polyethylene glycol (MIRALAX / GLYCOLAX) packet Take 17 g by mouth daily after supper. (Patient taking differently: Take 17 g by mouth daily as needed for mild constipation. ) 14 each 0  . predniSONE (DELTASONE) 5 MG tablet Take 17.5 mg by mouth daily with breakfast.     .  senna-docusate (SENOKOT-S) 8.6-50 MG tablet Take 3 tablets by mouth 2 (two) times daily as needed for mild constipation. 100 tablet 0  . Vitamin D, Ergocalciferol, (DRISDOL) 50000 units CAPS capsule Take 50,000 Units by mouth every 7 (seven) days.      Objective: BP 99/65   Pulse (!) 131   Temp 99.1 F (37.3 C)   Resp (!) 36   Ht 5' 5"  (1.651 m)   Wt 147 lb (66.7 kg)   SpO2 99%   BMI 24.46 kg/m  Exam: General: well nourished, well developed, in no acute distress with non-toxic appearance, video-chatting with sister on phone HEENT: normocephalic, atraumatic, moist mucous membranes, nasal drainage Neck: supple, non-tender without lymphadenopathy CV: tachycardic with regular rhythm without murmurs, rubs, or gallops Lungs: Coarse breath sounds bilaterally with wheezes but normal work of breathing on 3 L [place], productive cough present Abdomen: soft, non-tender, no masses or organomegaly palpable, normoactive bowel sounds Skin: warm, dry, no rashes or lesions, cap refill < 2 seconds Extremities: warm and well perfused, normal tone, no edema appreciated   Labs and Imaging: CBC BMET   Recent Labs Lab 11/01/16 1754  WBC 12.4*  HGB 11.4*  HCT 36.1  PLT 483*    Recent Labs Lab 11/01/16 1754  NA 136  K 4.0  CL 97*  CO2 23  BUN 6  CREATININE 0.68  GLUCOSE 181*  CALCIUM 9.3     UA: pending UCx: pending BCx: pending Lactic acid: 3.18 Influenza: pending  DG Chest 2 View (11/01/2016) FINDINGS: The lungs are well-aerated. Vascular congestion is noted. Increased interstitial markings may reflect  mild interstitial edema. Given the patient's symptoms, pneumonia could have a similar appearance. There is no evidence of pleural effusion or pneumothorax. The heart is borderline normal in size. A pacemaker is noted overlying the left chest wall, with leads ending overlying the right atrium, right ventricle and coronary sinus. No acute osseous abnormalities are seen. Clips are noted within the right upper quadrant, reflecting prior cholecystectomy.  IMPRESSION: Vascular congestion noted. Increased interstitial markings may reflect mild interstitial edema. Given the patient's symptoms, pneumonia could have a similar appearance.  Langston Bing, DO 11/01/2016, 8:11 PM PGY-1, [place] Intern pager: [phone], text pages welcome  I have seen and evaluated this patient along with Dr. Yisroel Ramming and reviewed the above note, making necessary revisions in red.   Olene Floss, MD, PGY-2 11/02/2016 2:41 AM

## 2016-11-01 NOTE — ED Notes (Signed)
[facility] contacted to activate Code Sepsis 

## 2016-11-01 NOTE — ED Triage Notes (Signed)
Patient here with increasing congestion, weakness and cough with tachycardia and low sats x 1 week. sats 89% on 2l on arrival, slightly diaphoretic. Patient states her BS has been running lower than normal this past week as well. States seen at [facility] last Saturday and treated with cough medication

## 2016-11-01 NOTE — Progress Notes (Signed)
Pharmacy Antibiotic Note  Makayla Vasquez is a 36 y.o. female admitted on 11/01/2016 with pneumonia.  Pharmacy has been consulted for Vancomycin / Zosyn dosing.  Received 1 st doses in ED at 8 pm  Plan: Zosyn 3.375 grams iv Q 8 hours - 4 hr infusion next at 4 am Vancomycin 750 mg iv Q 8 hours Follow up cultures, Scr, progress  Height: 5\' 5"  (165.1 cm) Weight: 147 lb (66.7 kg) IBW/kg (Calculated) : 57  Temp (24hrs), Avg:99.1 F (37.3 C), Min:99.1 F (37.3 C), Max:99.1 F (37.3 C)   Recent Labs Lab 11/01/16 1754 11/01/16 1803 11/01/16 2039  WBC 12.4*  --   --   CREATININE 0.68  --   --   LATICACIDVEN  --  3.18* 1.44    Estimated Creatinine Clearance: 87.5 mL/min (by C-G formula based on SCr of 0.68 mg/dL).    Allergies  Allergen Reactions  . 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 Sertraline Hcl Hives  . Tape Other (See Comments)    Burns skin    Thank you  2040, PharmD [phone]  11/01/2016 9:33 PM

## 2016-11-02 DIAGNOSIS — A419 Sepsis, unspecified organism: Principal | ICD-10-CM

## 2016-11-02 DIAGNOSIS — R509 Fever, unspecified: Secondary | ICD-10-CM

## 2016-11-02 LAB — URINALYSIS, ROUTINE W REFLEX MICROSCOPIC
Bilirubin Urine: NEGATIVE
GLUCOSE, UA: NEGATIVE mg/dL
HGB URINE DIPSTICK: NEGATIVE
KETONES UR: NEGATIVE mg/dL
LEUKOCYTES UA: NEGATIVE
Nitrite: NEGATIVE
PROTEIN: NEGATIVE mg/dL
Specific Gravity, Urine: 1.004 — ABNORMAL LOW (ref 1.005–1.030)
pH: 7 (ref 5.0–8.0)

## 2016-11-02 LAB — GLUCOSE, CAPILLARY
GLUCOSE-CAPILLARY: 94 mg/dL (ref 65–99)
GLUCOSE-CAPILLARY: 114 mg/dL — AB (ref 65–99)
Glucose-Capillary: 151 mg/dL — ABNORMAL HIGH (ref 65–99)
Glucose-Capillary: 162 mg/dL — ABNORMAL HIGH (ref 65–99)
Glucose-Capillary: 202 mg/dL — ABNORMAL HIGH (ref 65–99)
Glucose-Capillary: 99 mg/dL (ref 65–99)

## 2016-11-02 LAB — BASIC METABOLIC PANEL
Anion gap: 9 (ref 5–15)
Anion gap: 11 (ref 5–15)
BUN: <5 mg/dL — ABNORMAL LOW (ref 6–20)
CHLORIDE: 108 mmol/L (ref 101–111)
CO2: 23 mmol/L (ref 22–32)
CO2: 22 mmol/L (ref 22–32)
Calcium: 8.3 mg/dL — ABNORMAL LOW (ref 8.9–10.3)
Calcium: 8.6 mg/dL — ABNORMAL LOW (ref 8.9–10.3)
Chloride: 107 mmol/L (ref 101–111)
Creatinine, Ser: 0.45 mg/dL (ref 0.44–1.00)
Creatinine, Ser: 0.59 mg/dL (ref 0.44–1.00)
GFR calc Af Amer: >60 mL/min (ref >60)
GFR calc Af Amer: >60 mL/min (ref >60)
GFR calc non Af Amer: >60 mL/min (ref >60)
Glucose, Bld: 119 mg/dL — ABNORMAL HIGH (ref 65–99)
Glucose, Bld: 126 mg/dL — ABNORMAL HIGH (ref 65–99)
POTASSIUM: 3.2 mmol/L — AB (ref 3.5–5.1)
Potassium: 3.3 mmol/L — ABNORMAL LOW (ref 3.5–5.1)
Sodium: 140 mmol/L (ref 135–145)
Sodium: 140 mmol/L (ref 135–145)

## 2016-11-02 LAB — CBC
HEMATOCRIT: 31.3 % — AB (ref 36.0–46.0)
Hemoglobin: 9.5 g/dL — ABNORMAL LOW (ref 12.0–15.0)
MCH: 26.7 pg (ref 26.0–34.0)
MCHC: 30.4 g/dL (ref 30.0–36.0)
MCV: 87.9 fL (ref 78.0–100.0)
Platelets: 389 10*3/uL (ref 150–400)
RBC: 3.56 MIL/uL — ABNORMAL LOW (ref 3.87–5.11)
RDW: 15.6 % — AB (ref 11.5–15.5)
WBC: 7.2 10*3/uL (ref 4.0–10.5)

## 2016-11-02 LAB — CBG MONITORING, ED: Glucose-Capillary: 144 mg/dL — ABNORMAL HIGH (ref 65–99)

## 2016-11-02 LAB — MRSA PCR SCREENING: MRSA by PCR: NEGATIVE

## 2016-11-02 MED ORDER — GUAIFENESIN-DM 100-10 MG/5ML PO SYRP
5.0000 mL | ORAL_SOLUTION | ORAL | Status: DC | PRN
  Administered 2016-11-02 – 2016-11-04 (×4): 5 mL via ORAL
  Filled 2016-11-02 (×7): qty 5

## 2016-11-02 MED ORDER — IPRATROPIUM-ALBUTEROL 0.5-2.5 (3) MG/3ML IN SOLN: 3.0000 mL | RESPIRATORY_TRACT | Status: DC | PRN

## 2016-11-02 MED ORDER — SODIUM CHLORIDE 0.9 % IV BOLUS (SEPSIS)
1000.0000 mL | Freq: Once | INTRAVENOUS | Status: AC
  Administered 2016-11-02: 1000 mL via INTRAVENOUS

## 2016-11-02 MED ORDER — PREDNISONE 20 MG PO TABS
30.0000 mg | ORAL_TABLET | Freq: Every day | ORAL | Status: DC
  Administered 2016-11-03: 30 mg via ORAL
  Filled 2016-11-02: qty 1

## 2016-11-02 MED ORDER — PREDNISONE 5 MG PO TABS
17.5000 mg | ORAL_TABLET | Freq: Once | ORAL | Status: AC
  Administered 2016-11-02: 16:00:00 17.5 mg via ORAL
  Filled 2016-11-02: qty 1

## 2016-11-02 MED ORDER — POTASSIUM CHLORIDE CRYS ER 20 MEQ PO TBCR
40.0000 meq | EXTENDED_RELEASE_TABLET | Freq: Once | ORAL | Status: AC
Start: 2016-11-02 — End: 2016-11-02
  Administered 2016-11-02: 40 meq via ORAL
  Filled 2016-11-02 (×2): qty 2

## 2016-11-02 NOTE — Progress Notes (Signed)
Family Medicine Teaching Service Daily Progress Note Intern Pager: [phone]  Patient name: Makayla Vasquez Medical record number: [id number] Date of birth: 05/28/1981 Age: 36 y.o. Gender: female  Primary Care Provider: Tarri Abernethy, MD Consultants: None  Code Status: Full    Assessment and Plan: Makayla Vasquez is a 36 y.o. female with a past medical history significant for HFpEF (EF 50-55% 05/2016), congenital AV block (s/p BV pacemaker), DM on insulin, Lupus (on cellcept and prednisone), HTN, and asthma who presented with productive cough and dyspnea concerning for pneumonia.  #CAP, acute, improving Patient w/ recent history of URI 2 weeks ago with increased oxygen requirement and purulent productive cough. Chest x-ray findings concerning for pneumonia. Patient's tachycardia has improved since admission down from 130's to low 100's,. Patient has been afebrile, cough is still present but slightly improved. Procalcitonin is 1.9 suggesting infectious process that would warrant antibiotics. Lactic acid has trended down from 3.8 on admission to 1.21. Tachypnea is still present but has improved. Given patient ILD history and clinical presentation, improvement on antibiotics has been appropriate. Blood pressure have improved as well with IVF. Blood culture have showed no growth to date. Influenza panel was negative as well.  --Will hold transition to Levaquin 750 mg q8, patient with prolonged QTc --Continue Vanc/zosyn Day 2, (start 1/13) --Repeat morning EKG --F/u on urine culture --Continuouspulse ox --Zofran PRN --Protonix 40 mg daily --Follow up on amCBC and BMP --Incentive spirometry   #HFpEF with congenital AV block and BV pacemaker Patient's last echo on 05/2017 with EF of 50-55%. Patient tachycardic on arrival to ED with low BPs. . Does not appear to be in fluid overload on exams/p bolus in ED.  --Holding home Lasix 20 mg daily given low BP --Holding home Cozaar 25 mg daily  given low BP  #Diabetes mellitus with insulin dependence, poorly controlled Home regimen Lantus 35 units in AM and NovoLog 8 units TIDwith meals at home. Last A1c on 09/2017 was >15 with recent admission in December 2017 for hyperglycemia. --Holding home Lantus 35 units daily and NovoLog 8 units 3 times daily with meals given decreased POintake --Sensitive sliding scale, titrate accordingly --Continue CBG  #Lupus, chronic Patient on cellcept and prednisone at home. Patient endorses not taking these medications over the past week due to decreased POintake/feelling overwhelmed by changes in medications after last hospitalization.  --Increase prednisone home dose of 17.5 mg daily to 30 mg daily --Continue to hold cellcept in the setting of infection --Continue Percocet 5-325 mg q6 hr prn  #Hypertension, improving BP 116/74. Patient takes Cozaar at home, but has not taken this medication the last week. --Holding home Cozaar 25 mg daily given, will restart as needed --Continue to monitor BP  #Asthma, chronic No wheezing noted this morning on exam. Will continue current regimen. --Duonebs q2h prn  FEN/GI: Prior modified diet, MIVF and bolus as needed Prophylaxis: Lovenox SQ  Disposition: Pending improvement of suspected pneumonia   Subjective:  Patient is feeling better this morning, except for some mild left side chest pain. Patient denies shortness of breath, nausea, vomiting or abdominal pain.  Objective: Temp:  [97.7 F (36.5 C)-99.6 F (37.6 C)] 99.6 F (37.6 C) (01/14 1234) Pulse Rate:  [91-137] 110 (01/14 1000) Resp:  [15-45] 27 (01/14 1000) BP: (87-116)/(49-88) 116/74 (01/14 1000) SpO2:  [89 %-100 %] 95 % (01/14 1000) Weight:  [147 lb (66.7 kg)-149 lb 4 oz (67.7 kg)] 149 lb 4 oz (67.7 kg) (01/14 0440) Physical Exam:  General Appearance: patient is in no acute distress, cooperative, particpate in exam Lungs:  Normal expansion.  Clear to auscultation.  No rales,  rhonchi, or wheezing. Mild left side intercostals pain Heart:  Heart sounds, normal S1, S2. RRR without murmur, gallop or rub. Abdomen:  Soft, non-tender, normal bowel sounds; no bruits, organomegaly or masses. Musculoskeletal:  Spine range of motion normal. Muscular strength intact. Neurologic:  Alert and oriented x 3, gait normal., reflexes normal and symmetric, strength and  sensation grossly normal Psych exam: Alert,oriented, in NAD with a full range of affect, normal behavior and no psychotic features  Laboratory:  Recent Labs Lab 11/01/16 1754 11/02/16 0722  WBC 12.4* 7.2  HGB 11.4* 9.5*  HCT 36.1 31.3*  PLT 483* 389    Recent Labs Lab 11/01/16 1754 11/02/16 0722  NA 136 140  K 4.0 3.2*  CL 97* 108  CO2 23 23  BUN 6 <5*  CREATININE 0.68 0.45  CALCIUM 9.3 8.3*  PROT 7.8  --   BILITOT 0.8  --   ALKPHOS 70  --   ALT 21  --   AST 57*  --   GLUCOSE 181* 119*   Lactic Acid: 3.18-->1.44-->1.21 Procalcitonin:1.91  Imaging/Diagnostic Tests: Dg Chest 2 View  Result Date: 11/01/2016 CLINICAL DATA:  Acute onset of fever, hypoxia and shortness of breath. Assess loss productive cough. Initial encounter. EXAM: CHEST  2 VIEW COMPARISON:  Chest radiograph performed 09/23/2016 FINDINGS: The lungs are well-aerated. Vascular congestion is noted. Increased interstitial markings may reflect mild interstitial edema. Given the patient's symptoms, pneumonia could have a similar appearance. There is no evidence of pleural effusion or pneumothorax. The heart is borderline normal in size. A pacemaker is noted overlying the left chest wall, with leads ending overlying the right atrium, right ventricle and coronary sinus. No acute osseous abnormalities are seen. Clips are noted within the right upper quadrant, reflecting prior cholecystectomy. IMPRESSION: Vascular congestion noted. Increased interstitial markings may reflect mild interstitial edema. Given the patient's symptoms, pneumonia could  have a similar appearance. Electronically Signed   By: Roanna Raider M.D.   On: 11/01/2016 19:05    Lovena Neighbours, MD 11/02/2016, 2:42 PM PGY-1, [place] [facility]PT[facility] Intern pager: [phone], text pages welcome

## 2016-11-03 DIAGNOSIS — J159 Unspecified bacterial pneumonia: Secondary | ICD-10-CM

## 2016-11-03 DIAGNOSIS — IMO0002 Reserved for concepts with insufficient information to code with codable children: Secondary | ICD-10-CM

## 2016-11-03 DIAGNOSIS — D72829 Elevated white blood cell count, unspecified: Secondary | ICD-10-CM

## 2016-11-03 DIAGNOSIS — M329 Systemic lupus erythematosus, unspecified: Secondary | ICD-10-CM

## 2016-11-03 DIAGNOSIS — Z7952 Long term (current) use of systemic steroids: Secondary | ICD-10-CM

## 2016-11-03 DIAGNOSIS — Z79899 Other long term (current) drug therapy: Secondary | ICD-10-CM

## 2016-11-03 DIAGNOSIS — E872 Acidosis, unspecified: Secondary | ICD-10-CM

## 2016-11-03 DIAGNOSIS — Z79624 Long term (current) use of inhibitors of nucleotide synthesis: Secondary | ICD-10-CM

## 2016-11-03 LAB — BASIC METABOLIC PANEL
ANION GAP: 9 (ref 5–15)
BUN: <5 mg/dL — ABNORMAL LOW (ref 6–20)
CALCIUM: 8.3 mg/dL — AB (ref 8.9–10.3)
CO2: 22 mmol/L (ref 22–32)
Chloride: 108 mmol/L (ref 101–111)
Creatinine, Ser: 0.48 mg/dL (ref 0.44–1.00)
GLUCOSE: 207 mg/dL — AB (ref 65–99)
POTASSIUM: 3.8 mmol/L (ref 3.5–5.1)
Sodium: 139 mmol/L (ref 135–145)

## 2016-11-03 LAB — GLUCOSE, CAPILLARY
GLUCOSE-CAPILLARY: 174 mg/dL — AB (ref 65–99)
GLUCOSE-CAPILLARY: 234 mg/dL — AB (ref 65–99)
GLUCOSE-CAPILLARY: 231 mg/dL — AB (ref 65–99)
Glucose-Capillary: 185 mg/dL — ABNORMAL HIGH (ref 65–99)
Glucose-Capillary: 184 mg/dL — ABNORMAL HIGH (ref 65–99)

## 2016-11-03 LAB — URINE CULTURE

## 2016-11-03 LAB — MAGNESIUM: Magnesium: 1.7 mg/dL (ref 1.7–2.4)

## 2016-11-03 MED ORDER — MAGNESIUM SULFATE 2 GM/50ML IV SOLN
2.0000 g | Freq: Once | INTRAVENOUS | Status: AC
  Administered 2016-11-03: 2 g via INTRAVENOUS
  Filled 2016-11-03: qty 50

## 2016-11-03 MED ORDER — INSULIN ASPART 100 UNIT/ML ~~LOC~~ SOLN
0.0000 [IU] | Freq: Three times a day (TID) | SUBCUTANEOUS | Status: DC
  Administered 2016-11-03 (×2): 3 [IU] via SUBCUTANEOUS

## 2016-11-03 MED ORDER — PREDNISONE 50 MG PO TABS
50.0000 mg | ORAL_TABLET | Freq: Every day | ORAL | Status: DC
  Administered 2016-11-04: 50 mg via ORAL
  Filled 2016-11-03: qty 1

## 2016-11-03 MED ORDER — AMOXICILLIN-POT CLAVULANATE 875-125 MG PO TABS
1.0000 | ORAL_TABLET | Freq: Two times a day (BID) | ORAL | Status: DC
  Administered 2016-11-03 – 2016-11-04 (×3): 1 via ORAL
  Filled 2016-11-03 (×3): qty 1

## 2016-11-03 NOTE — Progress Notes (Signed)
Family Medicine Teaching Service Daily Progress Note Intern Pager: [phone]  Patient name: Makayla Vasquez Medical record number: [id number] Date of birth: [birth year] Age: 36 y.o. Gender: female  Primary Care Provider: Tarri Abernethy, MD Consultants: None  Code Status: Full  Assessment and Plan: Makayla Vasquez is a 36 y.o. female with a past medical history significant for HFpEF (EF 50-55% 05/2016), congenital AV block (s/p BV pacemaker), DM on insulin, Lupus (on cellcept and prednisone), HTN, and asthma who presented with productive cough and dyspnea concerning for pneumonia.  #Community-acquired pneumonia meeting sepsis criteria: Acute, improved. No longer in sepsis. Patient w/ recent history of URI 2 weeks ago with increased oxygen requirement and purulent productive cough. Chest x-ray findings concerning for pneumonia. Patient's tachycardia resolved. Patient remains afebrile, cough is still present but slightly improved. Procalcitonin is 1.9 suggesting infectious etiology warranting antibiotics. Lactic acidosis resolved. Tachypnea is still present but has improved. Given patient ILD history and clinical presentation, improvement on antibiotics has been appropriate. Blood pressure stable with IVF. BCx NGTD. Influenza negative.  --Will transition from Vanc/zosyn (1/13>1/15), to Augmentin 875-125 mg BID (1/15>, day 3 of 14) --Keep oxygen saturation equal to or >88%, to wean to home O2 (2-4 L) --F/u on urine culture --Continuouspulse ox --Protonix 40 mg daily --Incentive spirometry  #HFpEF with congenital AV block and BV pacemaker: Chronic, stable. Patient's last echo on 05/2017 with EF of 50-55%. Patient BP 90-100s/50-70s. Continues not to be in fluid overload on exam. --Holding home Lasix 20 mg daily given low BP --Holding home Cozaar 25 mg daily given low BP  #Prolonged QTc: Acute, worsening. Patient has worsening QTc 541>577 but denies using Levaquin prescribed by urgent care last  week. Meds reviewed with pharmacy and are not found to be source. --Will avoid medications which prolonged QTc  #Diabetes mellitus with insulin dependence: Chronic, oorly controlled. Home regimen Lantus 35 units in AM and NovoLog 8 units TIDwith meals at home. Last A1c on 09/2017 was >15 with recent admission 09/2016 for hyperglycemia. CBG between high 100s to low 200s. --Holding home Lantus 35 units daily and NovoLog 8 units 3 times daily with meals given decreased POintake --Sensitive sliding scale, titrate accordingly --Continue CBG  #Lupus: Chronic, stable. Patient on cellcept and prednisone at home. Patient endorses not taking these medications over the past week due to decreased POintake/feelling overwhelmed by changes in medications after last hospitalization.  --Prednisone 30 mg QD (home dose 15 mg QD) --Continue to hold cellcept in the setting of infection --Continue Percocet 5-325 mg q6h PRN  #Hypertension: Chronic, improving. BP 90-100s/50-70s. Patient takes Cozaar at home, but has not taken this medication the week prior to admission. --Holding home Cozaar 25 mg daily given, will restart as needed --Continue to monitor BP  #Asthma: Chronic, stable. No wheezing on exam. Will continue current regimen. --Duonebs q2h PRN  FEN/GI: Carb modified diet, MIVF and bolus as needed Prophylaxis: Lovenox SQ  Disposition: Pending improvement of suspected pneumonia  Subjective:  Patient states she feels about the same as yesterday. Endorses diffuse leg pain bilaterally which she states is chronic and usually on the onset of a lupus flare. Patient denies shortness of breath, chest pain, nausea or vomiting. Patient also endorses palpitations which is chronic for her intermittently.  Objective: Temp:  [97.2 F (36.2 C)-99.6 F (37.6 C)] 97.8 F (36.6 C) (01/15 0733) Pulse Rate:  [81-118] 97 (01/15 0600) Resp:  [23-56] 32 (01/15 0600) BP: (80-116)/(53-77) 103/72 (01/15 0600) SpO2:   [88 %-100 %]  88 % (01/15 0600) Physical Exam: General: well nourished, well developed, in no acute distress with non-toxic appearance HEENT: normocephalic, atraumatic, moist mucous membranes CV: regular rate and rhythm without murmurs, rubs, or gallops Lungs: tachypnic, clear to auscultation bilaterally with normal work of breathing on 5 L [place] Abdomen: soft, non-tender, normoactive bowel sounds Skin: warm, dry, no rashes or lesions, cap refill < 2 seconds Extremities: warm and well perfused, normal tone  Laboratory:  Recent Labs Lab 11/01/16 1754 11/02/16 0722  WBC 12.4* 7.2  HGB 11.4* 9.5*  HCT 36.1 31.3*  PLT 483* 389    Recent Labs Lab 11/01/16 1754 11/02/16 0722 11/02/16 1946 11/03/16 0215  NA 136 140 140 139  K 4.0 3.2* 3.3* 3.8  CL 97* 108 107 108  CO2 23 23 22 22   BUN 6 <5* <5* <5*  CREATININE 0.68 0.45 0.59 0.48  CALCIUM 9.3 8.3* 8.6* 8.3*  PROT 7.8  --   --   --   BILITOT 0.8  --   --   --   ALKPHOS 70  --   --   --   ALT 21  --   --   --   AST 57*  --   --   --   GLUCOSE 181* 119* 126* 207*   UA: negative UCx: pending BCx: NGTD Influenza: pending Lactic Acid: 3.18>1.44>1.21 Procalcitonin:1.91  Imaging/Diagnostic Tests: DG Chest 2 View (11/01/2016) FINDINGS: The lungs are well-aerated. Vascular congestion is noted. Increased interstitial markings may reflect mild interstitial edema. Given the patient's symptoms, pneumonia could have a similar appearance. There is no evidence of pleural effusion or pneumothorax. The heart is borderline normal in size. A pacemaker is noted overlying the left chest wall, with leads ending overlying the right atrium, right ventricle and coronary sinus. No acute osseous abnormalities are seen. Clips are noted within the right upper quadrant, reflecting prior cholecystectomy.  IMPRESSION: Vascular congestion noted. Increased interstitial markings may reflect mild interstitial edema. Given the patient's symptoms, pneumonia  could have a similar appearance.    11/03/2016, DO 11/03/2016, 7:58 AM PGY-1, [facility] FPTS Intern pager: [phone], text pages welcome

## 2016-11-03 NOTE — Discharge Summary (Signed)
[facility] Discharge Summary  Patient name: Makayla Vasquez Medical record number: [id number] Date of birth: 26-Nov-1980 Age: 36 y.o. Gender: female Date of Admission: 11/01/2016  Date of Discharge: 11/04/2016 Admitting Physician: Makayla Covert, MD  Primary Care Provider: Adin Hector, MD Consultants: None  Indication for Hospitalization: Community-acquired pneumonia with increased oxygen requirement meeting sepsis criteria  Discharge Diagnoses/Problem List:  Community-acquired pneumonia meeting sepsis criteria HFpEF with congenital AV block and BV pacemaker Prolonged QTc  Interstitial lung disease with chronic oxygen use Diabetes mellitus with insulin dependence Lupus Hypertension Asthma  Disposition: Home  Discharge Condition: Stable, improved  Discharge Exam:  General: well nourished, well developed, in no acute distress with non-toxic appearance HEENT: normocephalic, atraumatic, moist mucous membranes CV: regular rate and rhythm without murmurs, rubs, or gallops Lungs: clear to auscultation bilaterally with normal work of breathing on 4 L [place], no cough present Abdomen: soft, non-tender, normoactive bowel sounds Skin: warm, dry, no rashes or lesions, cap refill < 2 seconds Extremities: warm and well perfused, normal tone  Brief Hospital Course:  Makayla Vasquez a 36 y.o.female with a past medical history significant for HFpEF (EF 50-55% 05/2016), congenital AV block (s/p BV pacemaker), DM on insulin, Lupus (on cellceptand prednisone), HTN, and asthma who presented with productive cough and dyspnea concerning for pneumonia.  Sepsis/Pneumonia: patient presented with gradual worsening of purulent productive cough, dyspnea, and increased oxygen requirement from baseline (2-4 L [place] at home) meeting sepsis criteria with a lactic acidosis concerning for CAP. Chest x-ray showed increased interstitial markings. She was started on IV vancomycin  and Zosyn in ED. She was continued on Vanc and Zosyn for 2 days with significant improvement in her symptoms. She was transitioned to PO Augmentin instead of Levaquin due to QTc prolongation noted on her EKG although this could be due to her pacemaker. Patient was weaned to home O2 and discharged home on Augmentin for 11 more days to complete a 14 day course given her immune status.   Lupus: On CellCept and prednisone at home that she did not take for 1 week prior to admission. Cellcept was held given infectious state. Prednisone was restarted at home dose and then increase to stress dose. Patient was discharged on stress dose steroid for 3 more days with the instruction to resume her home dose. CellCept was held on discharge with a plan to resume at her follow-up in clinic.   Hypertension: Remained low normotensive without antihypertensive medication during this hospitalization. Furosemide and losartan were held on discharge with a plan to resume at follow-up based on her blood pressures.   Issues for Follow Up:  1. Pneumonia: Discharged on Augmentin 875-125 mg BID for 11 more days to complete 14 day course (1/15> 1/26) given her immune status.  2. Lupus: patient was not taking CellCept or prednisone for 1 week prior to admission. Prednisone was restarted and increased for stress dose adjustment at 50 mg QD. Patient was instructed to complete this course for 3 days then restart home dose 15 mg QD. CellCept was held given infectious state. Patient instructed to restart CellCept after PCP follow-up. 3. Bilateral leg weakness: discharge home with home PT per PT recommendation.  She has wheelchair and walker at home for ambulation.  4. Hypertension: patient remained low normotensive during hospitalization. Lasix and losartan were held this hospitalization. Assess blood pressure and resume as needed. Also recommend repeating BMP.   Significant Procedures: None  Significant Labs and Imaging:  Recent  Labs Lab 11/01/16 1754 11/02/16 0722  WBC 12.4* 7.2  HGB 11.4* 9.5*  HCT 36.1 31.3*  PLT 483* 389    Recent Labs Lab 11/01/16 1754 11/02/16 0722 11/02/16 1946 11/03/16 0215 11/03/16 1029 11/04/16 0500  NA 136 140 140 139  --  142  K 4.0 3.2* 3.3* 3.8  --  3.2*  CL 97* 108 107 108  --  112*  CO2 23 23 22 22   --  23  GLUCOSE 181* 119* 126* 207*  --  131*  BUN 6 <5* <5* <5*  --  5*  CREATININE 0.68 0.45 0.59 0.48  --  0.46  CALCIUM 9.3 8.3* 8.6* 8.3*  --  8.3*  MG  --   --   --   --  1.7  --   ALKPHOS 70  --   --   --   --   --   AST 57*  --   --   --   --   --   ALT 21  --   --   --   --   --   ALBUMIN 2.5*  --   --   --   --   --    UA: Negative UCx: NGTD BCx: NGTD Influenza: Negative Lactic Acid: 3.18>1.44>1.21 Procalcitonin:1.91  DG Chest 2 View (11/01/2016) FINDINGS: The lungs are well-aerated. Vascular congestion is noted. Increased interstitial markings may reflect mild interstitial edema. Given the patient's symptoms, pneumonia could have a similar appearance. There is no evidence of pleural effusion or pneumothorax. The heart is borderline normal in size. A pacemaker is noted overlying the left chest wall, with leads ending overlying the right atrium, right ventricle and coronary sinus. No acute osseous abnormalities are seen. Clips are noted within the right upper quadrant, reflecting prior cholecystectomy.  IMPRESSION: Vascular congestion noted. Increased interstitial markings may reflect mild interstitial edema. Given the patient's symptoms, pneumonia could have a similar appearance.  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 with elevated CK on atrovastatin 40   Sertraline Hcl Hives   Tape Other (See Comments)   Burns skin      Medication List    STOP taking these medications   camphor-menthol  lotion Commonly known as:  SARNA   fluticasone 50 MCG/ACT nasal spray Commonly known as:  FLONASE   furosemide 20 MG tablet Commonly known as:  LASIX   loratadine 10 MG tablet Commonly known as:  CLARITIN   losartan 25 MG tablet Commonly known as:  COZAAR   moxifloxacin 400 MG tablet Commonly known as:  AVELOX   mycophenolate 500 MG tablet Commonly known as:  CELLCEPT   oxyCODONE-acetaminophen 5-325 MG tablet Commonly known as:  PERCOCET/ROXICET     TAKE these medications   amoxicillin-clavulanate 875-125 MG tablet Commonly known as:  AUGMENTIN Take 1 tablet by mouth every 12 (twelve) hours.   calcium-vitamin D 500-200 MG-UNIT tablet Commonly known as:  OSCAL WITH D Take 1 tablet by mouth daily with breakfast. Reported on 04/15/2016   fluocinonide 0.05 % external solution Commonly known as:  LIDEX 1 APPLICATION APPLY ON THE SKIN AS DIRECTED APPLY TO AFFECTED AREAS DAILY ON SCALP AS NEEDED FOR ITCHING.   guaiFENesin-codeine 100-10 MG/5ML syrup Commonly known as:  ROBITUSSIN AC Take 10 mLs by mouth 4 (four) times daily as needed for  cough.   insulin aspart 100 UNIT/ML FlexPen Commonly known as:  NOVOLOG Inject 8 Units into the skin 3 (three) times daily with meals.   Insulin Glargine 100 UNIT/ML Solostar Pen Commonly known as:  LANTUS SOLOSTAR Inject 35 Units into the skin daily. What changed:  when to take this   ipratropium 0.06 % nasal spray Commonly known as:  ATROVENT Place 2 sprays into both nostrils 4 (four) times daily.   NONFORMULARY OR COMPOUNDED ITEM [facility]:  Onychomycosis Nail Lacquer - Fluconazole 2%, Terbinafine 1%, apply to affected area daily.   ONE TOUCH ULTRA 2 w/Device Kit Check blood sugar before meals and at bedtim What changed:  additional instructions   pantoprazole 40 MG tablet Commonly known as:  PROTONIX Take 1 tablet (40 mg total) by mouth daily.   polyethylene glycol packet Commonly known as:  MIRALAX /  GLYCOLAX Take 17 g by mouth daily after supper.   predniSONE 5 MG tablet Commonly known as:  DELTASONE Take 17.5 mg by mouth daily with breakfast. What changed:  Another medication with the same name was added. Make sure you understand how and when to take each.   predniSONE 50 MG tablet Commonly known as:  DELTASONE Take 1 tab daily for 3 days What changed:  You were already taking a medication with the same name, and this prescription was added. Make sure you understand how and when to take each.   senna-docusate 8.6-50 MG tablet Commonly known as:  Senokot-S Take 3 tablets by mouth 2 (two) times daily as needed for mild constipation.   Vitamin D (Ergocalciferol) 50000 units Caps capsule Commonly known as:  DRISDOL Take 50,000 Units by mouth every 7 (seven) days.       Discharge Instructions: Please refer to Patient Instructions section of EMR for full details.  Patient was counseled important signs and symptoms that should prompt return to medical care, changes in medications, dietary instructions, activity restrictions, and follow up appointments.   Follow-Up Appointments: Follow-up Information    Makayla Hector, MD. Go on 11/06/2016.   Specialty:  Family Medicine Why:  Go to appointment at 3:45 PM. Contact information: [place] [address] [facility].   Why:  For home health PT. They will call you in 1-2 days to schedule your first home visit. Contact information: [address] [place] [address] [phone]           Mercy Riding, MD 11/04/2016, 4:38 PM PGY-1, [facility]

## 2016-11-04 ENCOUNTER — Telehealth: Payer: Self-pay | Admitting: Student

## 2016-11-04 DIAGNOSIS — R5381 Other malaise: Secondary | ICD-10-CM

## 2016-11-04 DIAGNOSIS — M320 Drug-induced systemic lupus erythematosus: Secondary | ICD-10-CM

## 2016-11-04 DIAGNOSIS — J189 Pneumonia, unspecified organism: Secondary | ICD-10-CM

## 2016-11-04 DIAGNOSIS — I4581 Long QT syndrome: Secondary | ICD-10-CM

## 2016-11-04 LAB — BASIC METABOLIC PANEL
ANION GAP: 7 (ref 5–15)
BUN: 5 mg/dL — ABNORMAL LOW (ref 6–20)
CHLORIDE: 112 mmol/L — AB (ref 101–111)
CO2: 23 mmol/L (ref 22–32)
CREATININE: 0.46 mg/dL (ref 0.44–1.00)
Calcium: 8.3 mg/dL — ABNORMAL LOW (ref 8.9–10.3)
GFR calc non Af Amer: >60 mL/min (ref >60)
Glucose, Bld: 131 mg/dL — ABNORMAL HIGH (ref 65–99)
POTASSIUM: 3.2 mmol/L — AB (ref 3.5–5.1)
SODIUM: 142 mmol/L (ref 135–145)

## 2016-11-04 LAB — GLUCOSE, CAPILLARY
GLUCOSE-CAPILLARY: 95 mg/dL (ref 65–99)
Glucose-Capillary: 215 mg/dL — ABNORMAL HIGH (ref 65–99)

## 2016-11-04 MED ORDER — LOSARTAN POTASSIUM 25 MG PO TABS: 25.0000 mg | ORAL_TABLET | Freq: Every day | ORAL | 1 refills | Status: DC

## 2016-11-04 MED ORDER — PREDNISONE 50 MG PO TABS: ORAL_TABLET | ORAL | 0 refills | Status: DC

## 2016-11-04 MED ORDER — POTASSIUM CHLORIDE CRYS ER 20 MEQ PO TBCR
40.0000 meq | EXTENDED_RELEASE_TABLET | Freq: Once | ORAL | Status: AC
  Administered 2016-11-04: 40 meq via ORAL
  Filled 2016-11-04: qty 2

## 2016-11-04 MED ORDER — AMOXICILLIN-POT CLAVULANATE 875-125 MG PO TABS: 1.0000 | ORAL_TABLET | Freq: Two times a day (BID) | ORAL | 0 refills | Status: AC

## 2016-11-04 NOTE — Telephone Encounter (Signed)
There was some confusion about her blood pressure medication up on discharge. The idea was to hold the blood pressure medications (losartan and furosemide) due to low normal blood pressures during her hospitalization. However, prescription for losartan was sent to her pharmacy. By the time we called the pharmacy, patient had already picked her losartan. We called the patient multiple times and she finally picker her phone. I advised patient not to take a losartan until she follow up in clinic. I also advised her to pay attention to the losartan dose as she was on 12.5 mg before this hospital admissions and the new prescription was for 25 mg daily. I advised her to keep those medications aside. She could use half a tablet of the new prescription if the PCP decides to resume it at a follow-up. Patient voiced understanding and agreed to do as advised. Patient had no further questions.

## 2016-11-04 NOTE — Evaluation (Signed)
Physical Therapy Evaluation Patient Details Name: Makayla Vasquez MRN: [id number] DOB: 24-Jul-1981 Today's Date: 11/04/2016   History of Present Illness  Pt is a 36 yo female admitted with sepsis. She has h/o pacer and Lupus, supplemental oxygen use  Clinical Impression  Pt's mobility limited by baseline SOB and high HR.  She was willing to mobilize with PT although she reported feeling anxiousness with going home today.  Pt has all needed DME.  Oxygen saturation levels low with basic mobility including while on supplemental oxygen.  If pt remains within hospital, will continue to follow for progress and discharge planning.    Follow Up Recommendations Home health PT;Supervision/Assistance - 24 hour    Equipment Recommendations  None recommended by PT    Recommendations for Other Services       Precautions / Restrictions Precautions Precautions: Fall Restrictions Weight Bearing Restrictions: No      Mobility  Bed Mobility Overal bed mobility: Independent             General bed mobility comments: sit to/from supine  Transfers Overall transfer level: Needs assistance Equipment used: Rolling walker (2 wheeled) Transfers: Sit to/from [employer] Sit to Stand: Min guard Stand pivot transfers: Min guard       General transfer comment: stand pivot bed to [facility]  Ambulation/Gait Ambulation/Gait assistance: Min guard Ambulation Distance (Feet): 25 Feet Assistive device: Rolling walker (2 wheeled) (4L supplemental oxygen) Gait Pattern/deviations: Shuffle;Trunk flexed     General Gait Details: head flexed, minimal environmental scanning, standing rest break to manage SOB  Stairs            Wheelchair Mobility    Modified Rankin (Stroke Patients Only)       Balance Overall balance assessment: Needs assistance Sitting-balance support: No upper extremity supported;Feet supported Sitting balance-Leahy Scale: Fair Sitting balance - Comments: able  to perform perianal hygeine   Standing balance support: Bilateral upper extremity supported Standing balance-Leahy Scale: Fair Standing balance comment: reliance on external support                             Pertinent Vitals/Pain Pain Assessment: No/denies pain    Home Living Family/patient expects to be discharged to:: Private residence Living Arrangements: Spouse/significant other;Children Available Help at Discharge: Family;Personal care attendant;Available 24 hours/day (near 24 hrs/day) Type of Home: Apartment Home Access: Stairs to enter Entrance Stairs-Rails: Left Entrance Stairs-Number of Steps: 2 Home Layout: One level Home Equipment: Walker - 2 wheels;Wheelchair - manual;Tub bench Additional Comments: aid assists with bathing, dressing and meals    Prior Function Level of Independence: Needs assistance   Gait / Transfers Assistance Needed: short distance gait with RW and up to 4L oxygen, aid for ADLs  ADL's / Homemaking Assistance Needed: aid assists with bathing and dressing        Hand Dominance   Dominant Hand: Right    Extremity/Trunk Assessment   Upper Extremity Assessment Upper Extremity Assessment: Overall WFL for tasks assessed    Lower Extremity Assessment Lower Extremity Assessment: Overall WFL for tasks assessed (able to don socks and undergarment in sitting supervision)    Cervical / Trunk Assessment Cervical / Trunk Assessment: Kyphotic  Communication   Communication: No difficulties  Cognition Arousal/Alertness: Awake/alert Behavior During Therapy: Restless;WFL for tasks assessed/performed (pt reports anxiousness related to discharge home today) Overall Cognitive Status: Within Functional Limits for tasks assessed  General Comments General comments (skin integrity, edema, etc.): pre-exercise HR 118 bpm, 85% O2 sats on 2L via , post gait HR 132 bpm, O2 sats decr to 65% on 4L O2. Bed very wet with  urine upon arrival (DUE 3/4, able to hold basic conversation during mobility.)    Exercises     Assessment/Plan    PT Assessment Patient needs continued PT services  PT Problem List Decreased strength;Decreased activity tolerance;Decreased balance;Decreased mobility;Cardiopulmonary status limiting activity          PT Treatment Interventions DME instruction;Gait training;Stair training;Functional mobility training;Therapeutic activities;Therapeutic exercise;Balance training;Patient/family education    PT Goals (Current goals can be found in the Care Plan section)  Acute Rehab PT Goals Patient Stated Goal: return home PT Goal Formulation: With patient Time For Goal Achievement: 11/13/16 Potential to Achieve Goals: Good    Frequency Min 3X/week   Barriers to discharge        Co-evaluation               End of Session Equipment Utilized During Treatment: Gait belt Activity Tolerance: Patient limited by fatigue Patient left: in chair;with call bell/phone within reach Nurse Communication: Mobility status         Time: 1035-1101 PT Time Calculation (min) (ACUTE ONLY): 26 min   Charges:   PT Evaluation $PT Eval Low Complexity: 1 Procedure     PT G CodesCarrie Vasquez, PT [phone]     Makayla Vasquez 11/04/2016, 2:32 PM

## 2016-11-04 NOTE — Progress Notes (Signed)
Transitions of Care Pharmacy Note  Plan:  Educated on restart of losartan, increase in prednisone x3 days then return to PTA dose, duration of augmentin, and cellcept on hold until f/u with PCP.  Addressed concerns regarding addition of tumeric to medication regimen. Recommended to patient that she speaks with PCP and provider following her for lupus before adding tumeric to med list.  Addressed concerns regarding restart of insulin when she goes home - advised that she takes novolog as normal with meals and return to normal lantus regimen tomorrow AM.  Advised patient to consider a pill box to help with adherence.    --------------------------------------------- Makayla Vasquez is an 36 y.o. female who presents with a chief complaint of CAP . In anticipation of discharge, pharmacy has reviewed this patient's prior to admission medication history, as well as current inpatient medications listed per the [facility]  Current medication indications, dosing, frequency, and notable side effects reviewed with patient. patient verbalized understanding of current inpatient medication regimen and is aware that the After Visit Summary when presented, will represent the most accurate medication list at discharge.   Makayla Vasquez expressed concerns regarding insulin regimen, prednisone stress dose duration, addition of tumeric to med list.    Assessment: Understanding of regimen: fair Understanding of indications: fair Potential of compliance: fair Barriers to Obtaining Medications: No  Patient instructed to contact inpatient pharmacy team with further questions or concerns if needed.    Time spent preparing for discharge counseling: 10  Time spent counseling patient: 30   Thank you for allowing pharmacy to be a part of this patient's care.  Allena Katz, Pharm.D. PGY1 Pharmacy Resident 1/16/20184:39 PM Pager [phone]

## 2016-11-04 NOTE — Discharge Instructions (Signed)
Your admitted for pneumonia with a increased oxygen requirement. We treated you with antibiotics IV and switched you to oral antibiotics. Her symptoms improved on these medications and we were able to wean her oxygen to your baseline home dose. It is important that you complete the antibiotic Augmentin for the full 14 days (complete 11 more days, 1 tablet twice daily).  We started you on losartan for your blood pressure. It is important that you do not take K-dur (potassium pill) while taking this medication until you are evaluated at your PCP follow-up. Please also stopped your Lasix until you are seen at PCP follow-up.  Please hold your home dose prednisone 15 mg daily and take the 50 mg prednisone daily for 3 days total. When you complete this you can restart her home dose 15 mg daily. Restart her CellCept after you were seen by her PCP.  PCP hospital follow-up is scheduled on 11/06/2016 at 3:45 PM with Dr. Natale Milch at [facility] clinic. Make sure you come to this appointment.   Community-Acquired Pneumonia, Adult Introduction Pneumonia is an infection of the lungs. One type of pneumonia can happen while a person is in a hospital. A different type can happen when a person is not in a hospital (community-acquired pneumonia). It is easy for this kind to spread from person to person. It can spread to you if you breathe near an infected person who coughs or sneezes. Some symptoms include:  A dry cough.  A wet (productive) cough.  Fever.  Sweating.  Chest pain. Follow these instructions at home:  Take over-the-counter and prescription medicines only as told by your doctor.  Only take cough medicine if you are losing sleep.  If you were prescribed an antibiotic medicine, take it as told by your doctor. Do not stop taking the antibiotic even if you start to feel better.  Sleep with your head and neck raised (elevated). You can do this by putting a few pillows under your head, or you can sleep in a  recliner.  Do not use tobacco products. These include cigarettes, chewing tobacco, and e-cigarettes. If you need help quitting, ask your doctor.  Drink enough water to keep your pee (urine) clear or pale yellow. A shot (vaccine) can help prevent pneumonia. Shots are often suggested for:  People older than 37 years of age.  People older than 37 years of age:  Who are having cancer treatment.  Who have long-term (chronic) lung disease.  Who have problems with their body's defense system (immune system). You may also prevent pneumonia if you take these actions:  Get the flu (influenza) shot every year.  Go to the dentist as often as told.  Wash your hands often. If soap and water are not available, use hand sanitizer. Contact a doctor if:  You have a fever.  You lose sleep because your cough medicine does not help. Get help right away if:  You are short of breath and it gets worse.  You have more chest pain.  Your sickness gets worse. This is very serious if:  You are an older adult.  Your body's defense system is weak.  You cough up blood. This information is not intended to replace advice given to you by your health care provider. Make sure you discuss any questions you have with your health care provider. Document Released: 03/24/2008 Document Revised: 03/13/2016 Document Reviewed: 01/31/2015  2017 Elsevier

## 2016-11-04 NOTE — Care Management Note (Signed)
Case Management Note  Patient Details  Name: Makayla Vasquez MRN: [id number] Date of Birth: [birth year]  Subjective/Objective:                 Spoke with patient at the bedside. She lives at home, has WC and walker. Patient states she has used [facility] in the past and would like to use them again. CM requested HH PT order from MD, referral placed to [facility], clinical liaison from [facility].    Action/Plan:  No other CM needs identified.   Expected Discharge Date:  11/04/16               Expected Discharge Plan:  Home w Home Health Services  In-House Referral:     Discharge planning Services  CM Consult  Post Acute Care Choice:    Choice offered to:  Patient  DME Arranged:    DME Agency:     HH Arranged:  PT HH Agency:  [facility]  Status of Service:  Completed, signed off  If discussed at Long Length of Stay Meetings, dates discussed:    Additional Comments:  Lawerance Sabal, RN 11/04/2016, 11:36 AM

## 2016-11-06 ENCOUNTER — Inpatient Hospital Stay: Payer: Medicare Other | Admitting: Internal Medicine

## 2016-11-06 LAB — CULTURE, BLOOD (ROUTINE X 2)
CULTURE: NO GROWTH
Culture: NO GROWTH

## 2016-11-10 ENCOUNTER — Telehealth: Payer: Self-pay | Admitting: *Deleted

## 2016-11-10 NOTE — Telephone Encounter (Addendum)
Transitional Care Post-Discharge Follow-Up Phone Call:   Admit date: 11/01/2016 Discharge date: 11/04/2016  Discharge Disposition: Home  Best patient contact number: [phone] Emergency contact(s):  PCP: Natale Milch  Principal Discharge Diagnosis: CAP with increased oxygen requirements meeting sepsis criteria  Reason for Chronic Case Management: Co-morbidities as follows:  Congenital AV Block, Pacemaker, Interstitial Lung Disease, Lupus, IDT2DM, HTN, Asthma; 3 ED visits and 2 admissions in last 6 months.  Post-discharge Communication:   Call Completed: Yes, with patient   Interpreter Needed: No   Please check all that apply:  X Patient is caring for self at home with assistance from nurse aid 7 days per week ? Patient has caregiver. If so, name and best contact number:  ? Patient is knowledgeable of his/her condition(s) and/or treatment.  ? Family and/or caregiver is knowledgeable of patient's condition(s) and/or treatment.   Medication Reconciliation:  X Medication list reviewed with patient. Currently only taking Augmentin, prednisone and Insulins. Stopped losartan, lasix and K-Dur as instructed. X Patient has all discharge medications Yes, patient uses [facility] [address] X Patient has O2/CPAP ordered? If so, name of company that supplies: Receives Oxygen from [place]. Currently using 2-4 liters via [place]  Activities of Daily Living:  ? Independent  X Needs assist (describe) Has assistance with bathing and dressing from nurse aid ([employer] ? [facility] (describe)   Community resources in place for patient:  ? None  X Home Health If so, name of agency: [employer] ? [facility] If so, name of Care Manager and contact number:   ? Assisted Living  ? Hospice  ? Support Group    Topics discussed:  Home Environment: Patient lives in first floor apt with 2 other adults and 1 child  Home DME: Patient currently using walker. Also has w/c if needed.  Transportation:  Barriers? Patient has own car and denies transportation issues.   Food/Nutrition: (ability to afford, access, use of any community resources) Denies food insecurity.  Identified Barriers: None at present  Questions/concerns: Patient states she's, "Alright, but feeling weak." States breathing is, "good." Using walker to ambulate. Taking prednisone 17 mg daily. States AM (1100) fasting BS ranging 168-200 since hospital discharge.            TOC appointment rescheduled due to inclement weather on 11/06/2016. New appt is tomorrow, 11/11/16 at 1015 with PCP  L. Leward Quan, RN, BSN

## 2016-11-11 ENCOUNTER — Encounter: Payer: Self-pay | Admitting: Internal Medicine

## 2016-11-11 ENCOUNTER — Telehealth: Payer: Self-pay | Admitting: *Deleted

## 2016-11-11 ENCOUNTER — Ambulatory Visit (INDEPENDENT_AMBULATORY_CARE_PROVIDER_SITE_OTHER): Payer: Medicare Other | Admitting: Internal Medicine

## 2016-11-11 VITALS — BP 108/60 | HR 114 | Temp 97.7°F | Ht 65.0 in | Wt 146.0 lb

## 2016-11-11 DIAGNOSIS — I10 Essential (primary) hypertension: Secondary | ICD-10-CM

## 2016-11-11 DIAGNOSIS — J159 Unspecified bacterial pneumonia: Secondary | ICD-10-CM | POA: Diagnosis not present

## 2016-11-11 DIAGNOSIS — Z79899 Other long term (current) drug therapy: Secondary | ICD-10-CM | POA: Diagnosis not present

## 2016-11-11 DIAGNOSIS — Z794 Long term (current) use of insulin: Secondary | ICD-10-CM

## 2016-11-11 DIAGNOSIS — E1165 Type 2 diabetes mellitus with hyperglycemia: Secondary | ICD-10-CM | POA: Diagnosis not present

## 2016-11-11 NOTE — Telephone Encounter (Signed)
Spoke with Marchelle Folks at [place] [phone] regarding portable oxygen for this patient. States waiting on OV notes from [employer]. Patient had appt with them but had to cancel. Next appt is on 12/18/2016. Patient and PCP made aware. Kinnie Feil, RN, BSN

## 2016-11-11 NOTE — Patient Instructions (Addendum)
It was nice seeing you again today Ms. Mondesir!  Please start taking your CellCept again. It is also important to keep taking the Augmentin until you have finished all the pills.   Please start taking your Lantus 35 units every day. If you start to have low blood sugars, please let us know.   We will see you back in one month to see how you are doing.   If you have any questions or concerns, please feel free to call the clinic.   Be well,  Dr. Natale Milch

## 2016-11-11 NOTE — Assessment & Plan Note (Signed)
Has completed 7 of 14 day course of Augmentin. No complaints of coughing or SOB today and lungs CTAB.  - Complete remainder of Augmentin course

## 2016-11-11 NOTE — Assessment & Plan Note (Signed)
Continue current regimen

## 2016-11-11 NOTE — Telephone Encounter (Signed)
Please disregard this encounter.

## 2016-11-11 NOTE — Progress Notes (Signed)
TRANSITION OF CARE VISIT   Primary Care Physician (PCP): Carmine Savoy [facility]                                                   [address]                                                   [place], [place] [address]                                                   [place]  Date of Admission: 11/01/16  Date of Discharge: 11/04/16  Discharged from: [facility]  Discharge Diagnosis: Community-acquired pneumonia  Summary of Admission:  Makayla Vasquez a 36 y.o.female with a past medical history significant for HFpEF (EF 50-55% 05/2016), congenital AV block (s/p BV pacemaker), DM on insulin, Lupus (on cellceptand prednisone), HTN, and asthma who presented with productive cough and dyspnea concerning for pneumonia.  Sepsis/Pneumonia: patient presented with gradual worsening of purulent productive cough, dyspnea, and increased oxygen requirement from baseline (2-4 L [place] at home) meeting sepsis criteria with a lactic acidosis concerning for CAP. Chest x-ray showed increased interstitial markings. She was started on IV vancomycin and Zosyn in ED. She was continued on Vanc and Zosyn for 2 days with significant improvement in her symptoms. She was transitioned to PO Augmentin instead of Levaquin due to QTc prolongation noted on her EKG although this could be due to her pacemaker. Patient was weaned to home O2 and discharged home on Augmentin for 11 more days to complete a 14 day course given her immune status.   Lupus: On CellCept and prednisone at home that she did not take for 1 week prior to admission. Cellcept was held given infectious state. Prednisone was restarted at home dose and then increase to stress dose. Patient was discharged on stress dose steroid for 3 more days with the instruction to resume her home dose. CellCept was held on discharge with a plan to resume at  her follow-up in clinic.   Hypertension: Remained low normotensive without antihypertensive medication during this hospitalization. Furosemide and losartan were held on discharge with a plan to resume at follow-up based on her blood pressures.   TODAY's VISIT  Patient/Caregiver self-reported problems/concerns: fatigue but no other complaints.   MEDICATIONS  Medication Reconciliation conducted with patient/caregiver? Yes  New medications prescribed/discontinued upon discharge? Yes - Augmentin started, Cellcept held, prednisone dose changed.  Patient's prednisone dose is now back to her prior dose.  Patient has 7 days of Augmentin left. Patient to resume Cellcept today.   Barriers identified related to medications: None. Patient has her medications, has transportation, has food, and has DME.   LABS  Lab Reviewed Yes.  BMP also repeated per discharge recommendation.   PHYSICAL EXAM:  Physical Exam  Constitutional: She is oriented to person, place, and time and well-developed, well-nourished, and in no distress.  Sitting in wheelchair wearing O2   HENT:  Head: Normocephalic and atraumatic.  Mouth/Throat: Oropharynx is clear and moist. No oropharyngeal exudate.  Eyes: Conjunctivae and EOM are normal. Pupils are equal, round, and reactive to light. Right eye exhibits no discharge. Left eye exhibits no discharge.  Neck: Normal range of motion. Neck supple.  Cardiovascular: Normal rate, regular rhythm and normal heart sounds.   No murmur heard. Pulmonary/Chest: Effort normal and breath sounds normal. No respiratory distress. She has no wheezes.  Abdominal: Soft. Bowel sounds are normal. She exhibits no distension. There is no tenderness.  Lymphadenopathy:    She has no cervical adenopathy.  Neurological: She is alert and oriented to person, place, and time.  Skin: Skin is warm and dry.  Psychiatric: Affect and judgment normal.   ASSESSMENT: Community acquired bacterial  pneumonia Has completed 7 of 14 day course of Augmentin. No complaints of coughing or SOB today and lungs CTAB.  - Complete remainder of Augmentin course  Type 2 diabetes mellitus with hyperglycemia, with long-term current use of insulin (HCC) Continue current regimen.   On Cellcept therapy Resume today now that patient is back on original dose of prednisone.    PATIENT EDUCATION PROVIDED: See AVS   FOLLOW-UP (Include any further testing or referrals): Return in one month.

## 2016-11-11 NOTE — Assessment & Plan Note (Signed)
Resume today now that patient is back on original dose of prednisone.

## 2016-11-12 LAB — BASIC METABOLIC PANEL WITH GFR
BUN: 11 mg/dL (ref 7–25)
CO2: 24 mmol/L (ref 20–31)
Calcium: 8.8 mg/dL (ref 8.6–10.2)
Chloride: 105 mmol/L (ref 98–110)
Creat: 0.42 mg/dL — ABNORMAL LOW (ref 0.50–1.10)
GFR, Est Non African American: >89 mL/min (ref >=60)
Glucose, Bld: 145 mg/dL — ABNORMAL HIGH (ref 65–99)
POTASSIUM: 3.6 mmol/L (ref 3.5–5.3)
SODIUM: 141 mmol/L (ref 135–146)

## 2016-11-16 IMAGING — CR DG CHEST 1V PORT
1 series · 1 of 1 positions shown · non-contrast
Comparison: 09/18/2014 and earlier.

CLINICAL DATA: 33-year-old female with shortness of breath and
dizziness beginning at 9299 hrs. Hyperglycemia. Initial encounter.

EXAM:
PORTABLE CHEST - 1 VIEW

[AP]
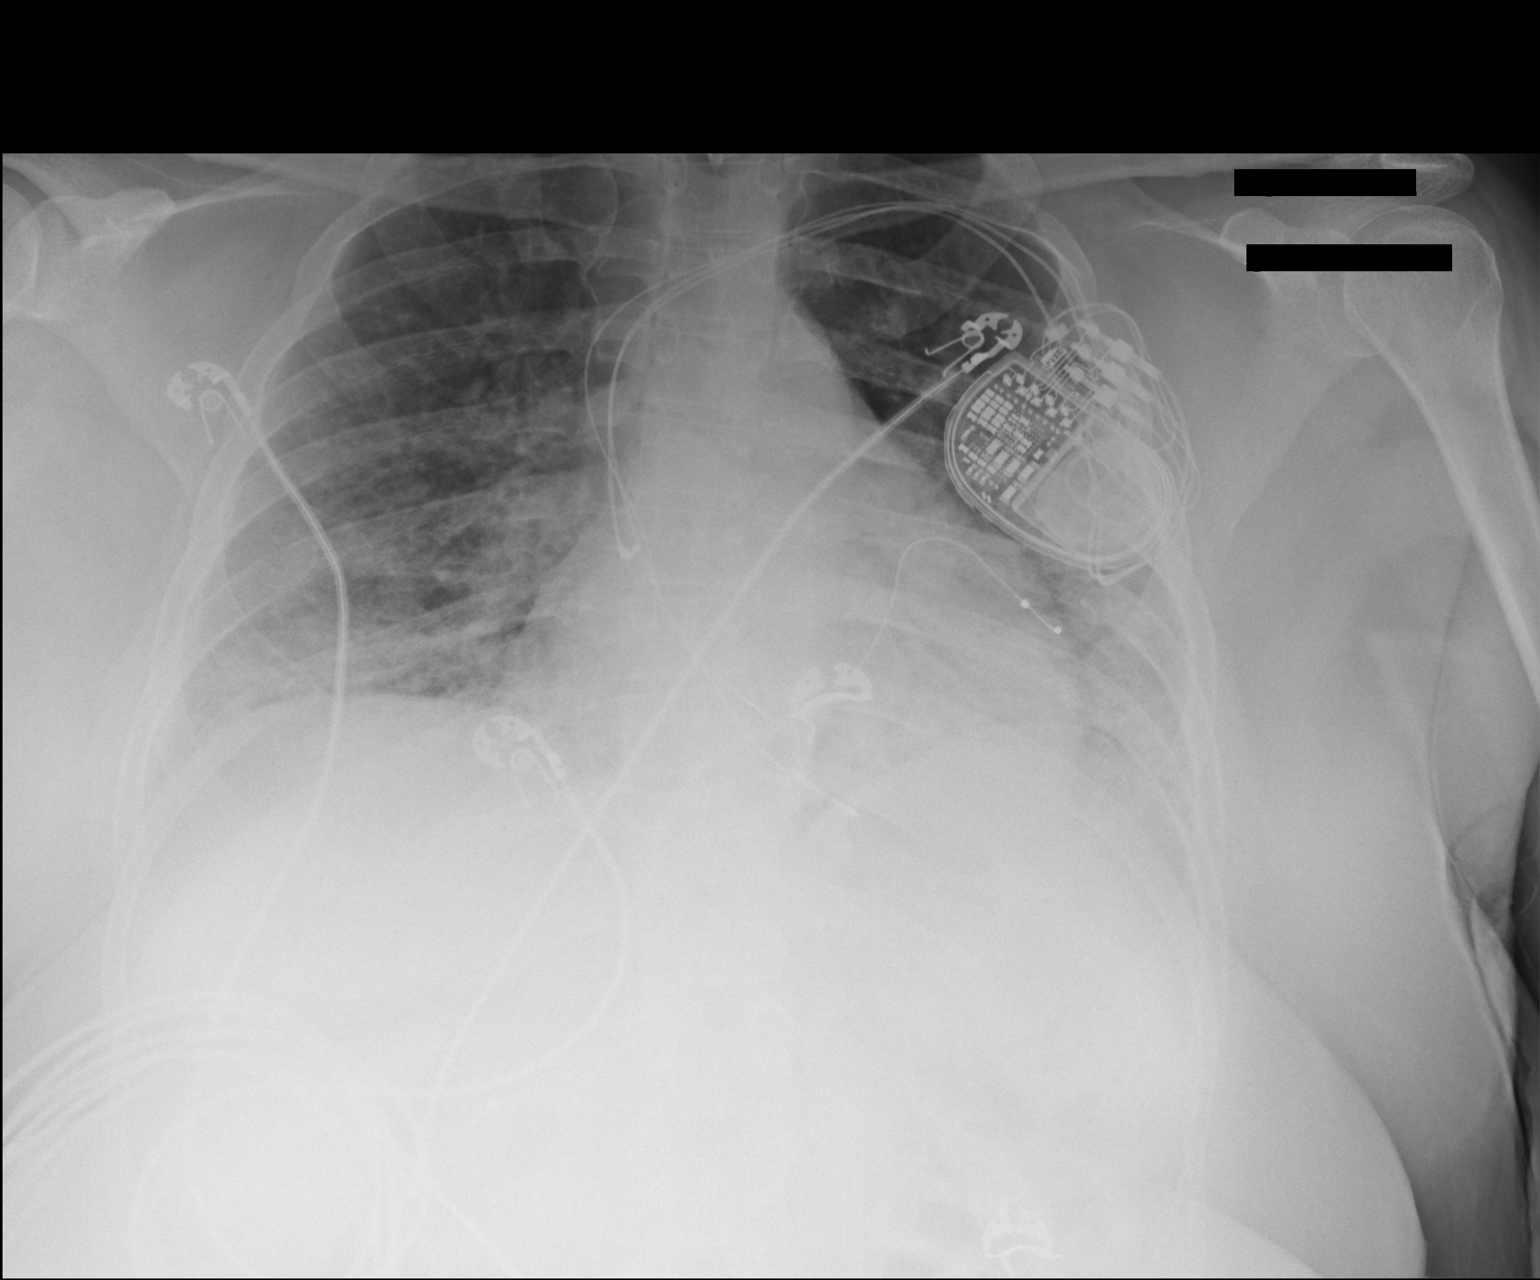

[1 of 1 positions shown; findings below may reference images not displayed]

FINDINGS: Portable AP semi upright view at 8323 hrs. Continued low lung
volumes. Stable cardiac size and mediastinal contours. Left chest
cardiac AICD/pacemaker re- identified. Continued basilar predominant
increased interstitial opacity, not significantly changed from
prior. No pneumothorax. No pleural effusion or consolidation
identified. Visualized tracheal air column is within normal limits.
IMPRESSION: Continued basilar predominant patchy and interstitial opacity as
seen on prior studies since May 2014 (see also chest CTA report
06/16/2014). No new cardiopulmonary abnormality identified.

## 2016-11-17 ENCOUNTER — Telehealth: Payer: Self-pay | Admitting: Internal Medicine

## 2016-11-17 NOTE — Telephone Encounter (Signed)
Pt would like a referral to [facility] for leg pain. ep

## 2016-11-18 NOTE — Telephone Encounter (Signed)
Attempted to call pt no voicemail. Will try again tomorrow. Deseree Bruna Potter, CMA

## 2016-11-19 NOTE — Telephone Encounter (Signed)
Patient has an appt scheduled for 12/12/16. Maryjean Morn, CMA

## 2016-11-22 IMAGING — CT CT MAXILLOFACIAL W/ CM
3 series · 15 of 47 positions shown, 18 images · IV contrast (Iodine)
Comparison: None.

CLINICAL DATA: Diffuse facial swelling with nasal pain for 3-4
days.

EXAM:
CT MAXILLOFACIAL WITH CONTRAST
TECHNIQUE: Multidetector CT imaging of the maxillofacial structures was
performed with intravenous contrast. Multiplanar CT image
reconstructions were also generated. A small metallic BB was placed
on the right temple in order to reliably differentiate right from
left.
CONTRAST:  80mL OMNIPAQUE IOHEXOL 300 MG/ML  SOLN

[Series 201: facial bones · axial · 0.43mm/px · z∈[+17,+145]mm · 9 of 76 slices shown, 12 images]
[im 6/76  brain]
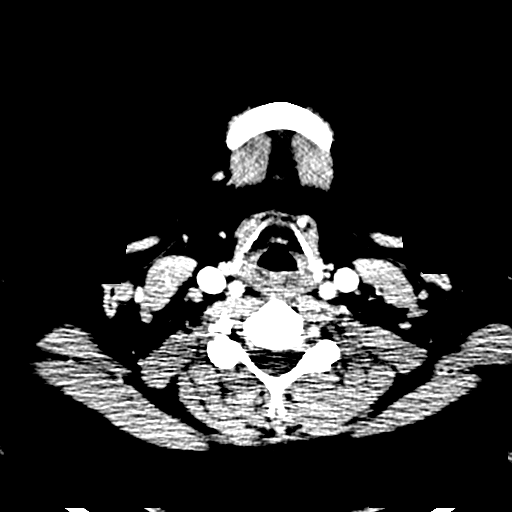
[im 6/76  bone]
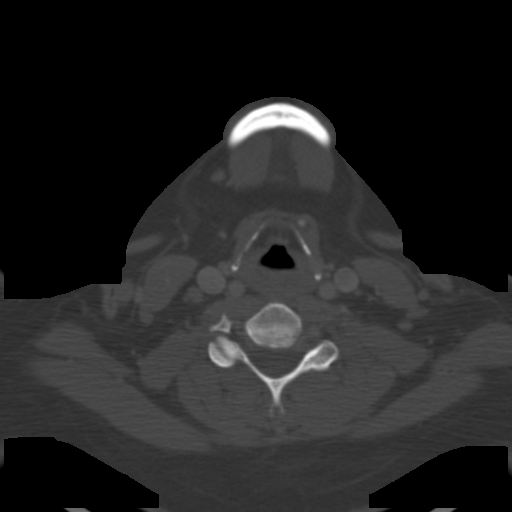
[im 13/76  bone]
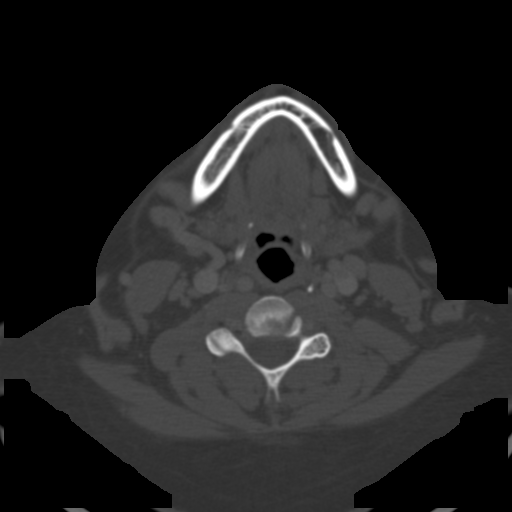
[im 21/76  bone]
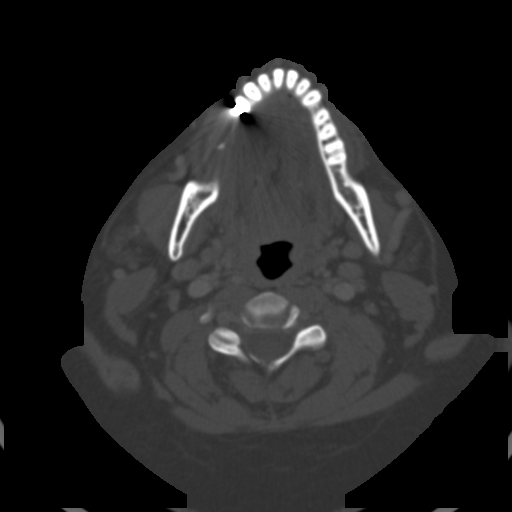
[im 29/76  bone]
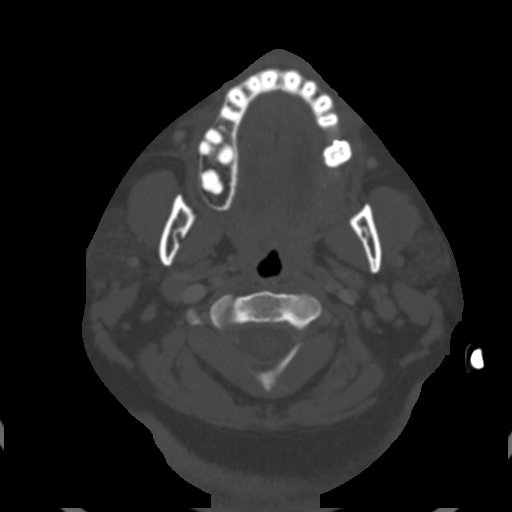
[im 39/76  brain]
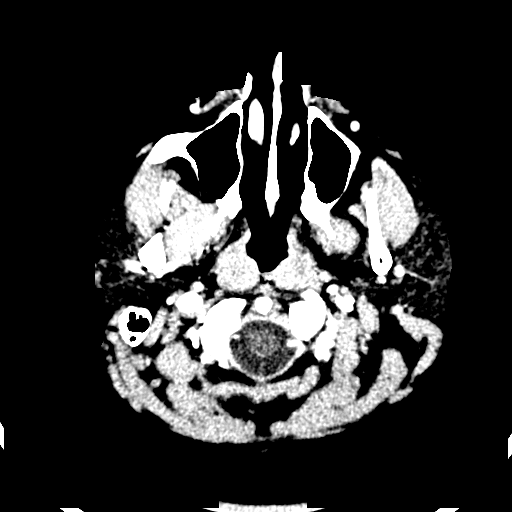
[im 39/76  bone]
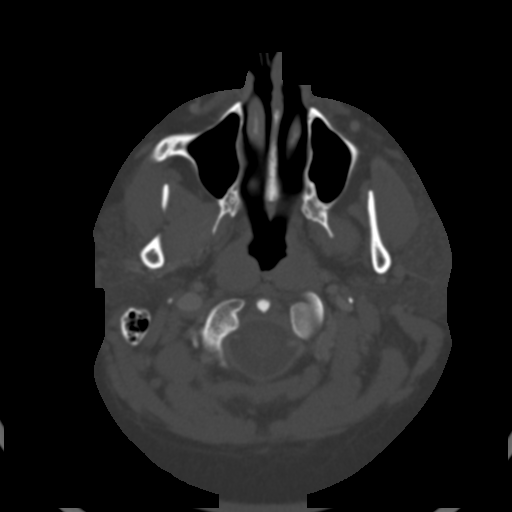
[im 47/76  bone]
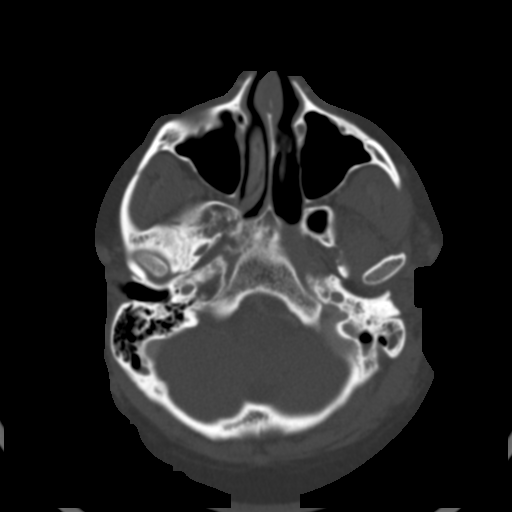
[im 55/76  bone]
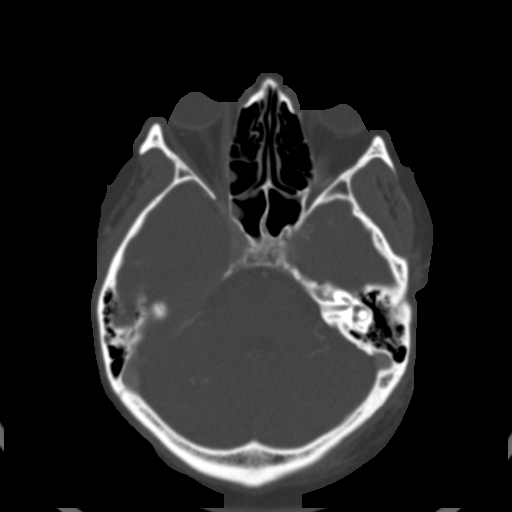
[im 63/76  bone]
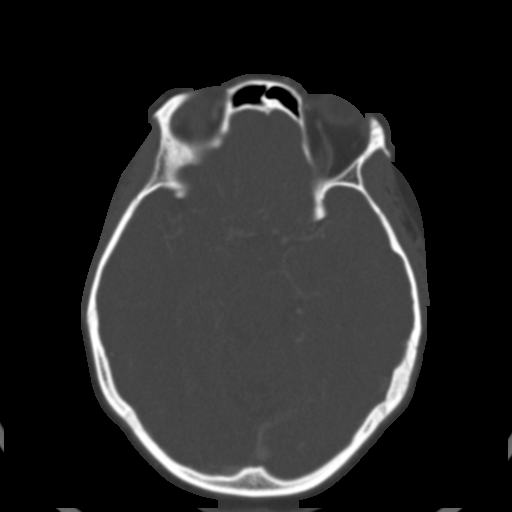
[im 70/76  brain]
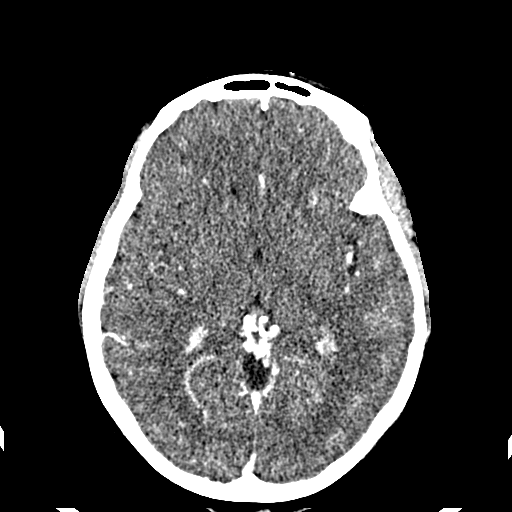
[im 70/76  bone]
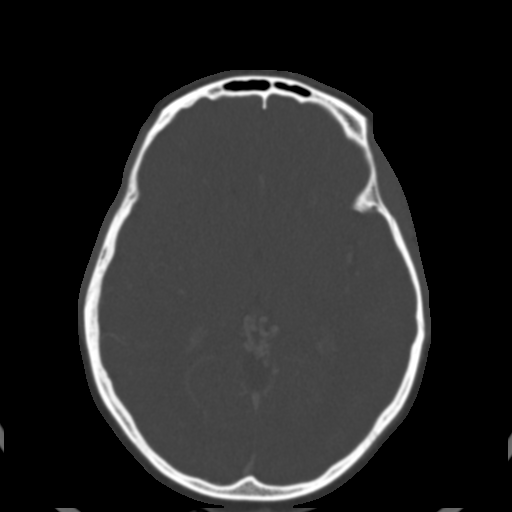

[Series 208: st cor · coronal · 0.43mm/px · 3 of 104 slices shown]
[im 35/104  bone]
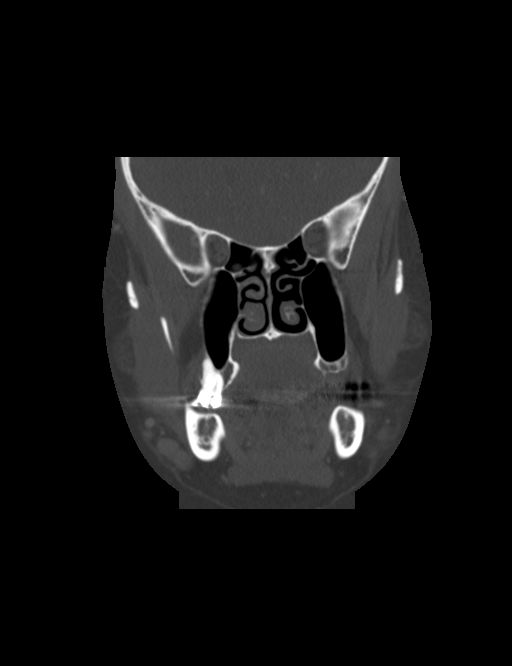
[im 46/104  bone]
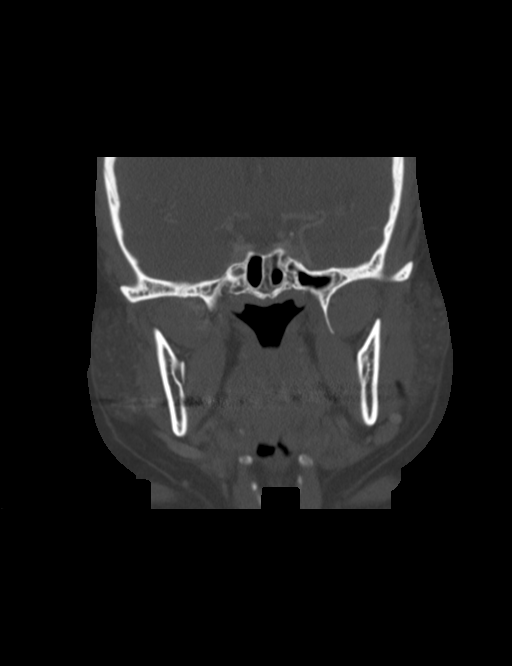
[im 58/104  bone]
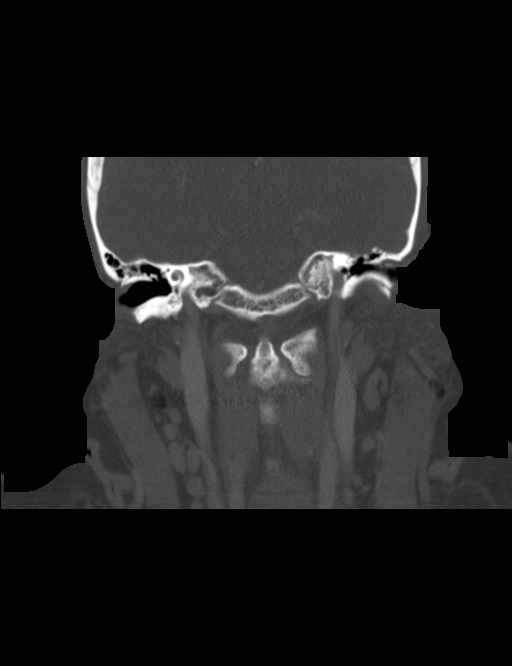

[Series 209: st sag · sagittal · 0.43mm/px · 3 of 98 slices shown]
[im 33/98  bone]
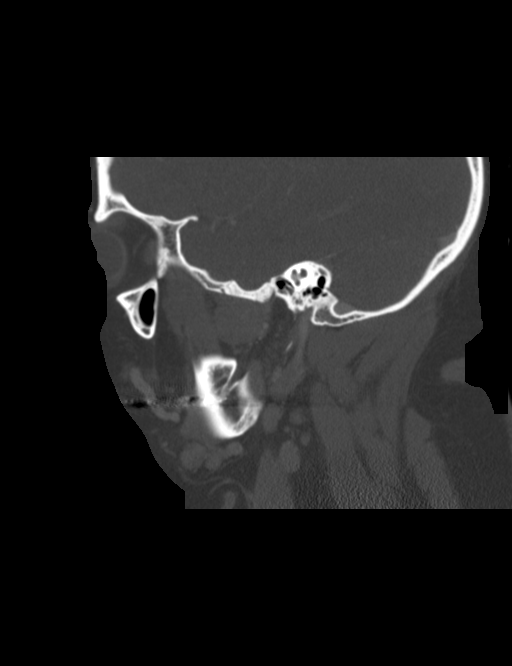
[im 49/98  bone]
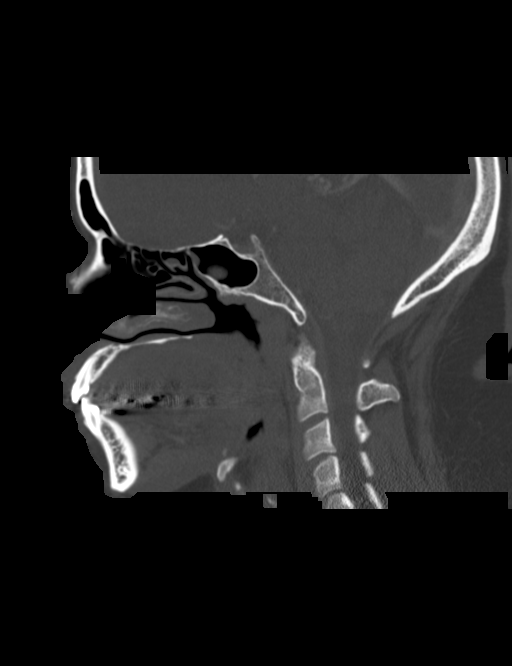
[im 65/98  bone]
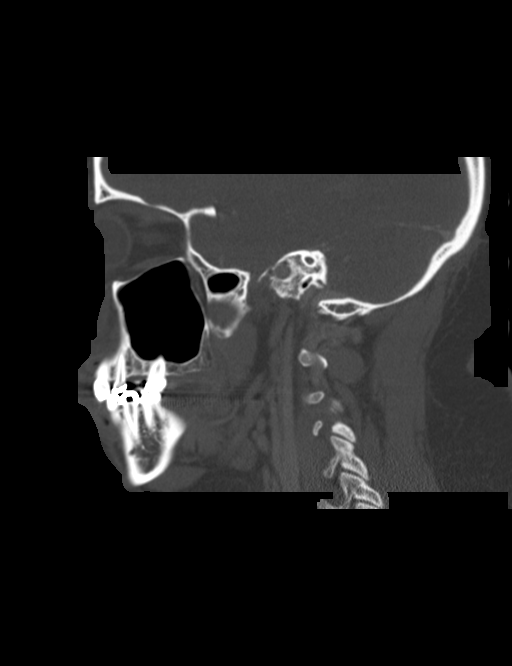

[15 of 47 positions shown; findings below may reference images not displayed]

FINDINGS: There is a 32 x 13 x 10 mm abscess the in the subcutaneous tissues
overlying the maxilla anteriorly in the midline of the upper lip. I
do not see any significant underlying dental disease. There is a
small cavity involving the left central incisor but no bone abscess.

No dental disease is identified except for small mucous retention
cyst or polyp in the right half of the sphenoid sinus.

The tongue base and floor of the mouth are unremarkable. The larynx
is normal. Mildly prominent tonsils. There are scattered bilateral
inflammatory/hyperplastic neck nodes bilaterally.

The visualized brain is unremarkable.
IMPRESSION: 32 x 13 x 10 mm abscess in the upper lip area centrally.

Inflamed/hyperplastic neck nodes bilaterally.

No significant dental disease or sinus disease.

## 2016-11-24 ENCOUNTER — Ambulatory Visit (INDEPENDENT_AMBULATORY_CARE_PROVIDER_SITE_OTHER): Payer: Medicare Other | Admitting: Internal Medicine

## 2016-11-24 ENCOUNTER — Encounter: Payer: Self-pay | Admitting: Internal Medicine

## 2016-11-24 ENCOUNTER — Ambulatory Visit (INDEPENDENT_AMBULATORY_CARE_PROVIDER_SITE_OTHER)
Admission: RE | Admit: 2016-11-24 | Discharge: 2016-11-24 | Disposition: A | Discharge status: Home / Self Care | Payer: Medicare Other | Source: Ambulatory Visit | Attending: Internal Medicine | Admitting: Internal Medicine

## 2016-11-24 VITALS — BP 120/72 | HR 99 | Ht 65.0 in | Wt 146.0 lb

## 2016-11-24 DIAGNOSIS — R05 Cough: Secondary | ICD-10-CM | POA: Diagnosis not present

## 2016-11-24 DIAGNOSIS — J4520 Mild intermittent asthma, uncomplicated: Secondary | ICD-10-CM | POA: Diagnosis not present

## 2016-11-24 DIAGNOSIS — I420 Dilated cardiomyopathy: Secondary | ICD-10-CM | POA: Diagnosis not present

## 2016-11-24 DIAGNOSIS — J849 Interstitial pulmonary disease, unspecified: Secondary | ICD-10-CM | POA: Diagnosis not present

## 2016-11-24 DIAGNOSIS — J9611 Chronic respiratory failure with hypoxia: Secondary | ICD-10-CM

## 2016-11-24 MED ORDER — GLYCOPYRROLATE-FORMOTEROL 9-4.8 MCG/ACT IN AERO: 2.0000 | INHALATION_SPRAY | Freq: Two times a day (BID) | RESPIRATORY_TRACT | 0 refills | Status: DC

## 2016-11-24 NOTE — Assessment & Plan Note (Signed)
I don't think the radiologist compared far enough back in assessing chest x-ray. Interstitial prominence has been present over the past year allowing for technique. Plan-update CXR

## 2016-11-24 NOTE — Progress Notes (Signed)
HPI F former smoker originaly referred for allergy evaluation, rhinitis, asthmatic bronchitis, then hosp 2015 for ?ALI with lower zone pneumonitis/ BAL and special studies NEG,. Lupus,  Acute on Chronic respiratory failure/ Hypoxemia,.DM Pacemaker for AV block  She tried allergy shots for 2 months,  but had strong local reactions. Allergy profile 02/23/2014-negative with total IgE 22.9 HSP panel was neg.  Positive ANA, a speckled positive ANA 1-80 pattern, mildly elevated ESR, non-reactive HIV, and negative PPD EF (25-30%, from baseline 30-25% in 2013).  2015- CXR-bilateral progressive lower lobe peribronchial infiltrates with groundglass changes and nodular pattern consistent with acute pneumonitis of unclear etiology Walk test on room air: 44/22/16: 98%, 92%, 91%, 90% at rest on room air. CT chest 06/28/2015-Severe bilaterally symmetric ground-glass consolidation.  -------------------------------------------------------------------------------------------------------------------------  04/15/2016-33 yoF former smoker originaly referred for allergy evaluation, rhinitis, asthmatic bronchitis, then hosp 2015 for ?ALI with lower zone pneumonitis/ BAL and special studies NEG,. Lupus,  Acute on Chronic respiratory failure/ Hypoxemia,.DM Pacemaker for AV block   O2 2L/ [facility]. LOV 3/20- NP-  Continued prednisone, added flonase and claritin for allergic rhinitis. FOLLOW FOR: breathing is doing well except when it is very hot.  Wants to get a portable concentrator. O2 sat today 96% on 2L Hosp 02/21/16- Sepsis,  Continued Cellcept and prednisone per Rheumatology, + anti JO-1 antibodies for auto-immune myositis Notices a little cough at night, nonproductive. Still easy dyspnea on exertion especially if she doesn't use her oxygen. Overall feels much better than when she was in hospital in May. CT chest 02/22/2016 IMPRESSION: Interstitial and patchy alveolar opacity, felt to most likely represent  pulmonary edema. There is cardiomegaly. There is felt to be a degree of congestive heart failure. Note that a degree of superimposed pneumonia cannot be excluded on this study. Adenopathy which may well be reactive in etiology. Gallbladder absent. Electronically Signed  By: Lowella Grip III M.D.  On: 02/22/2016 17:13  11/24/2016-35 yoF former smoker originaly referred for allergy evaluation, rhinitis, asthmatic bronchitis, then hosp 2015 for ?ALI with lower zone pneumonitis/ BAL and special studies NEG,. Lupus,  Acute on Chronic respiratory failure/ Hypoxemia,.DM Pacemaker for AV block  O2 2-4  L/[facility] 6 month follow up. Had pneumonia previously but still has a cough.  Semi-productive cough.  She has lost considerable weight since first met several years ago. Hospitalized again for pneumonia. Reports only clear mucus and some chilling without fever or purulent discharge, suggesting possibly viral etiology. Cough remains productive. Runny nose. She wanted reassurance that she didn't have pneumonia.  Chest x-ray reviewed and compared with priors. I think the pattern of interstitial prominence has been stable over the past year. It is nonspecific for vascular congestion versus interstitial pneumonia but may just reflect scarring. CXR 11/01/2016 IMPRESSION: Vascular congestion noted. Increased interstitial markings may reflect mild interstitial edema. Given the patient's symptoms, pneumonia could have a similar appearance.  ROS-see HPI Constitutional:   + weight loss, night sweats, fevers, + chills, fatigue, lassitude. HEENT:   , difficulty swallowing, tooth/dental problems, sore throat,       sneezing, itching, ear ache, nasal congestion, + post nasal drip,  CV:  No-   chest pain, orthopnea, PND, swelling in lower extremities, anasarca,  dizziness, palpitations Resp: + shortness of breath with exertion or at rest.             +  productive cough, + non-productive cough,  No- coughing up of blood.               No- wheezing.   Skin: No-   rash or lesions. GI:   heartburn, indigestion, abdominal pain, nausea, vomiting,  GU:  MS:  No-   joint pain or swelling.   Neuro-     nothing unusual Psych:  No- change in mood or affect. No depression or anxiety.  No memory loss.  OBJ- Physical Exam   O2 sat 98% on 2 L   multiple facial piercings, + wheelchair. 4 children in room. General- Alert, Oriented, Affect-appropriate, Distress- none acute,  Skin- +acanthotic hyperpigmentation around neck Lymphadenopathy- none Head- atraumatic            Eyes- Gross vision intact, PERRLA, conjunctivae and secretions clear            Ears- Hearing, canals-normal            Nose-  no-Septal dev, mucus, polyps, erosion, perforation             Throat- Mallampati III , mucosa clear , drainage- none, tonsils- atrophic Neck- flexible , trachea midline, no stridor , thyroid nl, carotid no bruit Chest - symmetrical excursion , unlabored           Heart/CV- RRR , no murmur , no gallop  , no rub, nl s1 s2                           - JVD- none , edema- none, stasis changes- none, varices- none           Lung- clear to P&A diminished in bases, wheeze- none, cough-none , dullness-none, rub- none.           Chest wall-  Abd-  Br/ Gen/ Rectal- Not done, not indicated Extrem- cyanosis- none, clubbing, none, atrophy- none, strength- nl Neuro- grossly intact to observation

## 2016-11-24 NOTE — Assessment & Plan Note (Signed)
Not sure how much reactive airways disease she has at this time.   plan-therapeutic trial of Bevespi inhaler, CXR

## 2016-11-24 NOTE — Assessment & Plan Note (Signed)
She has not had recent evaluation. Question if she has interstitial edema of chronic heart failure, or scarring. She will want follow-up with cardiology.

## 2016-11-24 NOTE — Patient Instructions (Signed)
Order- DME [facility] evaluate for portable O2 concentrator 3 L pulse  Dx chronic hypoxic respiratory failure   Order- CXR    Dx chronic respiratory failure with hypoxia  Sample Bevespi inhaler   Inhale 2 puffs twice daily until sample runs out. See if it helps our breathing while you finish recovering from the pneumonia.   Please call as needed

## 2016-11-24 NOTE — Assessment & Plan Note (Signed)
We need to have DME evaluate for lighter portable oxygen system, probably portable concentrator

## 2016-11-26 ENCOUNTER — Other Ambulatory Visit: Payer: Self-pay

## 2016-11-26 DIAGNOSIS — I5022 Chronic systolic (congestive) heart failure: Secondary | ICD-10-CM

## 2016-11-27 ENCOUNTER — Encounter (HOSPITAL_COMMUNITY): Payer: Self-pay

## 2016-11-27 ENCOUNTER — Encounter: Payer: Self-pay | Admitting: Internal Medicine

## 2016-11-27 ENCOUNTER — Ambulatory Visit (HOSPITAL_COMMUNITY)
Admission: RE | Admit: 2016-11-27 | Discharge: 2016-11-27 | Disposition: A | Discharge status: Home / Self Care | Payer: Medicare Other | Source: Ambulatory Visit | Attending: Cardiology | Admitting: Cardiology

## 2016-11-27 VITALS — BP 115/75 | HR 98 | Wt 143.8 lb

## 2016-11-27 DIAGNOSIS — J849 Interstitial pulmonary disease, unspecified: Secondary | ICD-10-CM | POA: Insufficient documentation

## 2016-11-27 DIAGNOSIS — F419 Anxiety disorder, unspecified: Secondary | ICD-10-CM | POA: Insufficient documentation

## 2016-11-27 DIAGNOSIS — R000 Tachycardia, unspecified: Secondary | ICD-10-CM | POA: Insufficient documentation

## 2016-11-27 DIAGNOSIS — I429 Cardiomyopathy, unspecified: Secondary | ICD-10-CM | POA: Diagnosis not present

## 2016-11-27 DIAGNOSIS — Z888 Allergy status to other drugs, medicaments and biological substances status: Secondary | ICD-10-CM | POA: Diagnosis not present

## 2016-11-27 DIAGNOSIS — R918 Other nonspecific abnormal finding of lung field: Secondary | ICD-10-CM | POA: Insufficient documentation

## 2016-11-27 DIAGNOSIS — M329 Systemic lupus erythematosus, unspecified: Secondary | ICD-10-CM | POA: Diagnosis not present

## 2016-11-27 DIAGNOSIS — F319 Bipolar disorder, unspecified: Secondary | ICD-10-CM | POA: Diagnosis not present

## 2016-11-27 DIAGNOSIS — Z794 Long term (current) use of insulin: Secondary | ICD-10-CM | POA: Insufficient documentation

## 2016-11-27 DIAGNOSIS — E669 Obesity, unspecified: Secondary | ICD-10-CM | POA: Diagnosis not present

## 2016-11-27 DIAGNOSIS — M069 Rheumatoid arthritis, unspecified: Secondary | ICD-10-CM | POA: Insufficient documentation

## 2016-11-27 DIAGNOSIS — R9431 Abnormal electrocardiogram [ECG] [EKG]: Secondary | ICD-10-CM | POA: Insufficient documentation

## 2016-11-27 DIAGNOSIS — Z950 Presence of cardiac pacemaker: Secondary | ICD-10-CM | POA: Insufficient documentation

## 2016-11-27 DIAGNOSIS — I4581 Long QT syndrome: Secondary | ICD-10-CM | POA: Diagnosis not present

## 2016-11-27 DIAGNOSIS — J45909 Unspecified asthma, uncomplicated: Secondary | ICD-10-CM | POA: Diagnosis not present

## 2016-11-27 DIAGNOSIS — K219 Gastro-esophageal reflux disease without esophagitis: Secondary | ICD-10-CM | POA: Insufficient documentation

## 2016-11-27 DIAGNOSIS — Z87891 Personal history of nicotine dependence: Secondary | ICD-10-CM | POA: Diagnosis not present

## 2016-11-27 DIAGNOSIS — I110 Hypertensive heart disease with heart failure: Secondary | ICD-10-CM | POA: Diagnosis not present

## 2016-11-27 DIAGNOSIS — I5022 Chronic systolic (congestive) heart failure: Secondary | ICD-10-CM | POA: Insufficient documentation

## 2016-11-27 DIAGNOSIS — E119 Type 2 diabetes mellitus without complications: Secondary | ICD-10-CM | POA: Diagnosis not present

## 2016-11-27 DIAGNOSIS — Q246 Congenital heart block: Secondary | ICD-10-CM | POA: Diagnosis not present

## 2016-11-27 DIAGNOSIS — I5032 Chronic diastolic (congestive) heart failure: Secondary | ICD-10-CM | POA: Insufficient documentation

## 2016-11-27 MED ORDER — FUROSEMIDE 20 MG PO TABS: 20.0000 mg | ORAL_TABLET | ORAL | 3 refills | Status: DC

## 2016-11-27 MED ORDER — BISOPROLOL FUMARATE 5 MG PO TABS: 2.5000 mg | ORAL_TABLET | ORAL | 3 refills | Status: DC

## 2016-11-27 MED ORDER — LOSARTAN POTASSIUM 25 MG PO TABS: 12.5000 mg | ORAL_TABLET | Freq: Every evening | ORAL | 3 refills | Status: DC

## 2016-11-27 NOTE — Progress Notes (Signed)
Patient ID: Makayla Vasquez, female   DOB: 04/08/1981, 36 y.o.   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   She has a h/o congenital HB and underwent first PPM at age 57. In 2011 had ECHO with EF 35-40%. LV dysfunction felt to be due to RV pacing so upgraded to CRT-P. Echo in 10/15 with EF 25%.  She also has concomitant lung disease and this has limited b-blocker dosing.  She follows with Dr. Annamaria Boots for her lung disease. She is a former smoker.  Admitted in 8/15 and 9/15 with worsening respiratory status and severe hypoxia. Had extensive w/u. Thought to have a viral pneumonitis complicated by atelectasis and mild edema vs. an autoimmune process. Discharged home on a course of levaquin and prednisone.   She was offered a lung biopsy for further diagnosis versus empiric treatment with steroids. She opted for empiric steroid treatment. She is followed by a rheumatologist and is on Cellcept and prednisone taper. 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  She is on oxygen and night and with activity.   Last echo in 1/17 showed EF 40-45%, mild LVH, diffuse hypokinesis.    Admission in 5/17 for ?sepsis and management of interstitial lung disease.    Most recent echo in 8/17 with EF improved to 50-55%, PASP 48 mmHg.   She was admitted with PNA in 1/18.  Lasix, losartan, and bisoprolol stopped.   She returns for follow up. Legs weak, likely related to her anti-synthetase syndrome.  She denies orthopnea, PND, chest pain.  She does not get short of breath but not very active because of leg weakness => walks only short distances.  Weight is down 3 lbs.  Recent CXR with increased interstitial markings.    ECG: NSR, BiV pacing  Optivol: fluid index > threshold with down-trending  impedance.  PFTs (12/15) showed a severe restrictive defect concerning for an interstitial process.   EP study 10/15 for persistent tachycardia and found to have sinus tachycardia.   06/16/14 CT scan showed bilateral progressive lower peribronchial infiltrates with groundglass changes and nodular patter consistent with acute pneumonitis of unclear etiology.  06/20/14 BAL showed 60% macrophages and 30% PMNs. Her AFB smear, fungal smear, legionella Ab, pneumocystis smear, ACE, and sputum culture are negative. Her AFB, legionella, fungal, and BAL culture as well as her fungal Ab, hypersensitivity pneumonitis panel were all negative Serology: DsDNA, RF, ANCA, HSP all negative  RHC 10/26/2014  RA = 2 RV = 27/1/5 PA = 29/7 (18) PCW = 4 Fick cardiac output/index = 4.5/2.2 PVR = 3.0 WU FA sat = 98% PA sat = 64%, 61%  Labs (10/15): TSH normal K 3.6, creatinine 1.1 Labs (10/31/2014) L k 3.9 Creatinine 1.06  Labs 07/23/2015: K 3.8 Creatinine 0.82  Labs (3/17): K 4, creatinine 0.6 Labs (5/17): K 4.1, creatinine 0.7 Labs (1/18): K 3.6, creatinine 0.42  Review of Systems: All systems reviewed and negative except as per HPI.   Past Medical History:  Diagnosis Date  . Anxiety   . Arthritis    rheumatoid arthritis- mild, no rheumatology care   . Asthma    seasonal allergies   . Asymptomatic LV dysfunction    a. Echo in Dec 2011 with EF 35 to 40%. Felt to be due to paced rhythm. b. EF 25-30% in  07/2014.  . Bipolar affective disorder ([place])   . Cardiac pacemaker    a. Since age 32 in 40. b. Upgrade to BiV in 2013.  [name] Carpal tunnel syndrome of right wrist   . CHF (congestive heart failure) ([place])   . Congenital complete AV block   . Depression    bipolar  . Diabetes mellitus without complication ([place])   . GERD (gastroesophageal reflux disease)   . Hypertension   . Lupus   . Lupus (systemic lupus erythematosus) ([place])   . Obesity   . Pneumonitis    a. a/w hypoxia - inflammatory - large  workup 07/2014.  . Presence of permanent cardiac pacemaker   . Seizures ([place])    as a child- from high fever  . Sinus tachycardia     Current Outpatient Prescriptions  Medication Sig Dispense Refill  . Blood Glucose Monitoring Suppl (ONE TOUCH ULTRA 2) w/Device KIT Check blood sugar before meals and at bedtim (Patient taking differently: Check blood sugar before meals and at bedtime) 1 each 0  . fluocinonide (LIDEX) 0.05 % external solution 1 APPLICATION APPLY ON THE SKIN AS DIRECTED APPLY TO AFFECTED AREAS DAILY ON SCALP AS NEEDED FOR ITCHING.  5  . Glycopyrrolate-Formoterol (BEVESPI AEROSPHERE) 9-4.8 MCG/ACT AERO Inhale 2 puffs into the lungs 2 (two) times daily. 1 Inhaler 0  . insulin aspart (NOVOLOG) 100 UNIT/ML FlexPen Inject 8 Units into the skin 3 (three) times daily with meals. 15 mL 11  . Insulin Glargine (LANTUS SOLOSTAR) 100 UNIT/ML Solostar Pen Inject 35 Units into the skin daily. (Patient taking differently: Inject 35 Units into the skin every morning. ) 15 mL 5  . ipratropium (ATROVENT) 0.06 % nasal spray Place 2 sprays into both nostrils 4 (four) times daily. 15 mL 1  . pantoprazole (PROTONIX) 40 MG tablet Take 1 tablet (40 mg total) by mouth daily. 30 tablet 5  . predniSONE (DELTASONE) 5 MG tablet Take 17.5 mg by mouth daily with breakfast.     . bisoprolol (ZEBETA) 5 MG tablet Take 0.5 tablets (2.5 mg total) by mouth every morning. 15 tablet 3  . furosemide (LASIX) 20 MG tablet Take 1 tablet (20 mg total) by mouth every other day. 15 tablet 3  . losartan (COZAAR) 25 MG tablet Take 0.5 tablets (12.5 mg total) by mouth every evening. 15 tablet 3   No current facility-administered medications for this encounter.     Allergies  Allergen Reactions  . 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 Sertraline Hcl Hives  . Tape Other (See Comments)    Burns skin      Social History   Social History  . Marital  status: Single    Spouse name: N/A  . Number of children: 1  . Years of education: N/A   Occupational History  . CNA    Social History Main Topics  . Smoking status: Former Smoker    Packs/day: 0.25    Years: 0.50    Types: Cigarettes    Quit date: 07/25/1996  . Smokeless tobacco: Never Used  . Alcohol use No  . Drug use: No  . Sexual activity: Yes    Birth control/ protection: Implant, IUD     Comment: placed 04/2011   Other Topics Concern  . Not on file   Social History Narrative   Works at [employer].        Family History  Problem Relation  Age of Onset  . Heart disease Mother     CHF (no details)  . Hypertension Mother   . Lupus Mother   . Heart disease Father     Murmur  . Heart disease Sister 25     No details.  History of a pacemaker    Vitals:   11/27/16 1141  BP: 115/75  Pulse: 98  SpO2: 99%  Weight: 143 lb 12 oz (65.2 kg)    PHYSICAL EXAM: General:  NAD HEENT: normal Neck: supple. JVP ~8.  Carotids 2+ bilat; no bruits. No lymphadenopathy or thryomegaly appreciated. Cor: PMI nonpalpable. Regular rate/rhythm. No rubs, gallops or murmurs. Lungs: CTAB Abdomen: obese soft, nontender, nondistended. No hepatosplenomegaly. No bruits or masses. Good bowel sounds. Extremities: no cyanosis, clubbing, rash, no edema Neuro: alert & oriented x 3, cranial nerves grossly intact. moves all 4 extremities w/o difficulty. Affect pleasant.  ASSESSMENT & PLAN: 1) Chronic systolic HF:  Echo 05/3381 EF 20-25%, echo 1/17 improved with EF 40-45% and echo in 8/17 with EF up to 50-55%. Nonischemic cardiomyopathy.  Medtronic CRT-P device.  Mild volume overload on exam and also on Optivol.  She has been off her cardiac meds since 1/18.  - Restart Lasix 20 mg every other day.  BMET/BNP in 10 days.  - Restart  bisoprolol 2.5 qam and losartan 12.5 qpm.   2) Anti-synthetase syndrome with interstitial lung disease: Suspect this also causes the persistent weakness.  She is followed by  rheumatology and is on Cellcept and prednisone.   3) Congenital AVN block s/p pacer: Has EP followup.   Followup in 4 months.   Loralie Champagne 11/27/2016

## 2016-11-27 NOTE — Patient Instructions (Signed)
Start Furosemide (Lasix) 20 mg daily FOR 3 DAYS ONLY, then take 20 mg every other day   Start Bisoprolol 2.5 mg (1/2 tab) every AM  Start Losartan 12.5 mg (1/2 tab) every evening   Labs in 10 days  Your physician recommends that you schedule a follow-up appointment in: 4 months

## 2016-12-03 ENCOUNTER — Telehealth: Payer: Self-pay | Admitting: Internal Medicine

## 2016-12-03 DIAGNOSIS — J9611 Chronic respiratory failure with hypoxia: Secondary | ICD-10-CM

## 2016-12-03 NOTE — Telephone Encounter (Signed)
Patient calling checking on status of POC. She can be reached at [phone] -pr

## 2016-12-03 NOTE — Telephone Encounter (Signed)
lmtcb for Mandy.  

## 2016-12-04 NOTE — Telephone Encounter (Signed)
Makayla Vasquez is returning phone call

## 2016-12-04 NOTE — Telephone Encounter (Signed)
ATC Mandy at [facility] at time of call.   I also contacted patient to inform her that [employer] is needing a bit more of information and they will reach out to her once Insurance has approved her POC.   Triage to call [employer](Mandy) again on 12-05-16

## 2016-12-08 ENCOUNTER — Telehealth: Payer: Self-pay

## 2016-12-08 ENCOUNTER — Inpatient Hospital Stay (HOSPITAL_COMMUNITY): Admission: RE | Admit: 2016-12-08 | Payer: Medicare Other | Source: Ambulatory Visit

## 2016-12-08 NOTE — Telephone Encounter (Signed)
She had asked for a ligher portable O2 system, but I realize now I don't have documentation that she actually needs Portable O2 currently.  Please put [employer] order on hold and schedule patient for PFT and 6 minute walk test on room air for dx chronic respiratory failure with hypoxia.

## 2016-12-08 NOTE — Telephone Encounter (Signed)
lmtcb x1 for pt to schedule appointments.

## 2016-12-08 NOTE — Telephone Encounter (Signed)
Spoke with Angelica Chessman with [employer], who states that pt OV notes from 11-24-16 needs to states why pt needs POC.  CY pt advise. Thanks.

## 2016-12-08 NOTE — Telephone Encounter (Signed)
Pt returned call from someone telling her she needs to have her A1C checked. She called back to let whomever called know that she has an appt on Friday 2/23 with the Dr. And to get her A1C checked. Sunday Spillers, CMA

## 2016-12-09 ENCOUNTER — Encounter (HOSPITAL_COMMUNITY): Payer: Self-pay

## 2016-12-09 ENCOUNTER — Inpatient Hospital Stay (HOSPITAL_COMMUNITY)
Admission: EM | Admit: 2016-12-09 | Discharge: 2016-12-12 | DRG: 690 | Disposition: A | Discharge status: Home / Self Care | Payer: Medicare Other | Attending: Internal Medicine | Admitting: Internal Medicine

## 2016-12-09 ENCOUNTER — Emergency Department (HOSPITAL_COMMUNITY): Payer: Medicare Other

## 2016-12-09 DIAGNOSIS — Z79899 Other long term (current) drug therapy: Secondary | ICD-10-CM

## 2016-12-09 DIAGNOSIS — I509 Heart failure, unspecified: Secondary | ICD-10-CM

## 2016-12-09 DIAGNOSIS — Z9981 Dependence on supplemental oxygen: Secondary | ICD-10-CM | POA: Diagnosis not present

## 2016-12-09 DIAGNOSIS — I950 Idiopathic hypotension: Secondary | ICD-10-CM | POA: Diagnosis not present

## 2016-12-09 DIAGNOSIS — R0682 Tachypnea, not elsewhere classified: Secondary | ICD-10-CM | POA: Diagnosis not present

## 2016-12-09 DIAGNOSIS — Z7951 Long term (current) use of inhaled steroids: Secondary | ICD-10-CM | POA: Diagnosis not present

## 2016-12-09 DIAGNOSIS — I428 Other cardiomyopathies: Secondary | ICD-10-CM | POA: Diagnosis present

## 2016-12-09 DIAGNOSIS — M069 Rheumatoid arthritis, unspecified: Secondary | ICD-10-CM | POA: Diagnosis present

## 2016-12-09 DIAGNOSIS — I110 Hypertensive heart disease with heart failure: Secondary | ICD-10-CM | POA: Diagnosis present

## 2016-12-09 DIAGNOSIS — R531 Weakness: Secondary | ICD-10-CM | POA: Diagnosis not present

## 2016-12-09 DIAGNOSIS — Z794 Long term (current) use of insulin: Secondary | ICD-10-CM

## 2016-12-09 DIAGNOSIS — N3001 Acute cystitis with hematuria: Secondary | ICD-10-CM | POA: Diagnosis not present

## 2016-12-09 DIAGNOSIS — J849 Interstitial pulmonary disease, unspecified: Secondary | ICD-10-CM | POA: Diagnosis present

## 2016-12-09 DIAGNOSIS — N390 Urinary tract infection, site not specified: Secondary | ICD-10-CM | POA: Diagnosis not present

## 2016-12-09 DIAGNOSIS — I5032 Chronic diastolic (congestive) heart failure: Secondary | ICD-10-CM | POA: Diagnosis not present

## 2016-12-09 DIAGNOSIS — E1165 Type 2 diabetes mellitus with hyperglycemia: Secondary | ICD-10-CM

## 2016-12-09 DIAGNOSIS — N342 Other urethritis: Secondary | ICD-10-CM | POA: Diagnosis not present

## 2016-12-09 DIAGNOSIS — Z832 Family history of diseases of the blood and blood-forming organs and certain disorders involving the immune mechanism: Secondary | ICD-10-CM

## 2016-12-09 DIAGNOSIS — M329 Systemic lupus erythematosus, unspecified: Secondary | ICD-10-CM | POA: Diagnosis present

## 2016-12-09 DIAGNOSIS — I5033 Acute on chronic diastolic (congestive) heart failure: Secondary | ICD-10-CM | POA: Diagnosis not present

## 2016-12-09 DIAGNOSIS — J84115 Respiratory bronchiolitis interstitial lung disease: Secondary | ICD-10-CM | POA: Diagnosis not present

## 2016-12-09 DIAGNOSIS — E119 Type 2 diabetes mellitus without complications: Secondary | ICD-10-CM | POA: Diagnosis not present

## 2016-12-09 DIAGNOSIS — K219 Gastro-esophageal reflux disease without esophagitis: Secondary | ICD-10-CM | POA: Diagnosis present

## 2016-12-09 DIAGNOSIS — I5022 Chronic systolic (congestive) heart failure: Secondary | ICD-10-CM | POA: Diagnosis present

## 2016-12-09 DIAGNOSIS — Z950 Presence of cardiac pacemaker: Secondary | ICD-10-CM

## 2016-12-09 DIAGNOSIS — I5030 Unspecified diastolic (congestive) heart failure: Secondary | ICD-10-CM | POA: Diagnosis not present

## 2016-12-09 DIAGNOSIS — Z7952 Long term (current) use of systemic steroids: Secondary | ICD-10-CM

## 2016-12-09 DIAGNOSIS — Z87891 Personal history of nicotine dependence: Secondary | ICD-10-CM | POA: Diagnosis not present

## 2016-12-09 DIAGNOSIS — Z9049 Acquired absence of other specified parts of digestive tract: Secondary | ICD-10-CM

## 2016-12-09 DIAGNOSIS — E669 Obesity, unspecified: Secondary | ICD-10-CM

## 2016-12-09 DIAGNOSIS — E1169 Type 2 diabetes mellitus with other specified complication: Secondary | ICD-10-CM

## 2016-12-09 DIAGNOSIS — R079 Chest pain, unspecified: Secondary | ICD-10-CM | POA: Diagnosis not present

## 2016-12-09 LAB — MAGNESIUM: Magnesium: 1.8 mg/dL (ref 1.7–2.4)

## 2016-12-09 LAB — URINALYSIS, ROUTINE W REFLEX MICROSCOPIC
Glucose, UA: NEGATIVE mg/dL
Ketones, ur: 20 mg/dL — AB
Nitrite: POSITIVE — AB
Protein, ur: 30 mg/dL — AB
Specific Gravity, Urine: 1.028 (ref 1.005–1.030)
pH: 5 (ref 5.0–8.0)

## 2016-12-09 LAB — BASIC METABOLIC PANEL
Anion gap: 12 (ref 5–15)
BUN: 10 mg/dL (ref 6–20)
CO2: 22 mmol/L (ref 22–32)
Calcium: 8.9 mg/dL (ref 8.9–10.3)
Chloride: 101 mmol/L (ref 101–111)
Creatinine, Ser: 0.65 mg/dL (ref 0.44–1.00)
GFR calc Af Amer: >60 mL/min (ref >60)
GFR calc non Af Amer: >60 mL/min (ref >60)
Glucose, Bld: 160 mg/dL — ABNORMAL HIGH (ref 65–99)
Potassium: 4.5 mmol/L (ref 3.5–5.1)
Sodium: 135 mmol/L (ref 135–145)

## 2016-12-09 LAB — CBC WITH DIFFERENTIAL/PLATELET
Basophils Absolute: 0 10*3/uL (ref 0.0–0.1)
Basophils Relative: 0 %
Eosinophils Absolute: 0.1 10*3/uL (ref 0.0–0.7)
Eosinophils Relative: 1 %
HCT: 35.4 % — ABNORMAL LOW (ref 36.0–46.0)
Hemoglobin: 10.9 g/dL — ABNORMAL LOW (ref 12.0–15.0)
Lymphocytes Relative: 19 %
Lymphs Abs: 2 10*3/uL (ref 0.7–4.0)
MCH: 25.2 pg — ABNORMAL LOW (ref 26.0–34.0)
MCHC: 30.8 g/dL (ref 30.0–36.0)
MCV: 81.9 fL (ref 78.0–100.0)
Monocytes Absolute: 0.6 10*3/uL (ref 0.1–1.0)
Monocytes Relative: 6 %
Neutro Abs: 7.9 10*3/uL — ABNORMAL HIGH (ref 1.7–7.7)
Neutrophils Relative %: 74 %
Platelets: 410 10*3/uL — ABNORMAL HIGH (ref 150–400)
RBC: 4.32 MIL/uL (ref 3.87–5.11)
RDW: 17.3 % — ABNORMAL HIGH (ref 11.5–15.5)
WBC: 10.6 10*3/uL — ABNORMAL HIGH (ref 4.0–10.5)

## 2016-12-09 LAB — BRAIN NATRIURETIC PEPTIDE: B Natriuretic Peptide: 21.9 pg/mL (ref 0.0–100.0)

## 2016-12-09 LAB — TROPONIN I: Troponin I: 0.05 ng/mL (ref <0.03)

## 2016-12-09 LAB — GLUCOSE, CAPILLARY: Glucose-Capillary: 106 mg/dL — ABNORMAL HIGH (ref 65–99)

## 2016-12-09 LAB — MRSA PCR SCREENING: MRSA by PCR: NEGATIVE

## 2016-12-09 LAB — I-STAT BETA HCG BLOOD, ED (MC, WL, AP ONLY): I-stat hCG, quantitative: <5 m[IU]/mL (ref <5)

## 2016-12-09 MED ORDER — ONDANSETRON HCL 4 MG/2ML IJ SOLN: 4.0000 mg | Freq: Four times a day (QID) | INTRAMUSCULAR | Status: DC | PRN

## 2016-12-09 MED ORDER — SODIUM CHLORIDE 0.9 % IV SOLN: 250.0000 mL | INTRAVENOUS | Status: DC | PRN

## 2016-12-09 MED ORDER — INSULIN ASPART 100 UNIT/ML ~~LOC~~ SOLN
8.0000 [IU] | Freq: Three times a day (TID) | SUBCUTANEOUS | Status: DC
  Administered 2016-12-10 – 2016-12-12 (×6): 8 [IU] via SUBCUTANEOUS

## 2016-12-09 MED ORDER — ZOLPIDEM TARTRATE 5 MG PO TABS: 5.0000 mg | ORAL_TABLET | Freq: Every evening | ORAL | Status: DC | PRN

## 2016-12-09 MED ORDER — DEXTROSE 5 % IV SOLN
1.0000 g | INTRAVENOUS | Status: DC
  Administered 2016-12-09 – 2016-12-11 (×3): 1 g via INTRAVENOUS
  Filled 2016-12-09 (×3): qty 10

## 2016-12-09 MED ORDER — NITROGLYCERIN 0.4 MG SL SUBL: 0.4000 mg | SUBLINGUAL_TABLET | SUBLINGUAL | Status: DC | PRN

## 2016-12-09 MED ORDER — PANTOPRAZOLE SODIUM 40 MG PO TBEC
40.0000 mg | DELAYED_RELEASE_TABLET | Freq: Every day | ORAL | Status: DC
  Administered 2016-12-10 – 2016-12-12 (×3): 40 mg via ORAL
  Filled 2016-12-09 (×3): qty 1

## 2016-12-09 MED ORDER — SODIUM CHLORIDE 0.9 % IV BOLUS (SEPSIS)
1000.0000 mL | Freq: Once | INTRAVENOUS | Status: AC
  Administered 2016-12-09: 1000 mL via INTRAVENOUS

## 2016-12-09 MED ORDER — ONDANSETRON HCL 4 MG/2ML IJ SOLN
4.0000 mg | Freq: Once | INTRAMUSCULAR | Status: AC
  Administered 2016-12-09: 4 mg via INTRAVENOUS
  Filled 2016-12-09: qty 2

## 2016-12-09 MED ORDER — SODIUM CHLORIDE 0.9 % IV SOLN
INTRAVENOUS | Status: AC
  Administered 2016-12-09: 20:00:00 via INTRAVENOUS

## 2016-12-09 MED ORDER — PREDNISONE 5 MG PO TABS
17.5000 mg | ORAL_TABLET | Freq: Every day | ORAL | Status: DC
  Administered 2016-12-10 – 2016-12-12 (×3): 17.5 mg via ORAL
  Filled 2016-12-09 (×3): qty 1

## 2016-12-09 MED ORDER — IPRATROPIUM BROMIDE 0.06 % NA SOLN
2.0000 | Freq: Every day | NASAL | Status: DC | PRN
  Filled 2016-12-09: qty 15

## 2016-12-09 MED ORDER — MAGNESIUM OXIDE 400 (241.3 MG) MG PO TABS
200.0000 mg | ORAL_TABLET | Freq: Every day | ORAL | Status: DC
  Administered 2016-12-09 – 2016-12-12 (×4): 200 mg via ORAL
  Filled 2016-12-09 (×4): qty 1

## 2016-12-09 MED ORDER — SODIUM CHLORIDE 0.9% FLUSH: 3.0000 mL | INTRAVENOUS | Status: DC | PRN

## 2016-12-09 MED ORDER — ENOXAPARIN SODIUM 40 MG/0.4ML ~~LOC~~ SOLN
40.0000 mg | SUBCUTANEOUS | Status: DC
  Administered 2016-12-10 – 2016-12-12 (×2): 40 mg via SUBCUTANEOUS
  Filled 2016-12-09 (×3): qty 0.4

## 2016-12-09 MED ORDER — ACETAMINOPHEN 325 MG PO TABS: 650.0000 mg | ORAL_TABLET | ORAL | Status: DC | PRN

## 2016-12-09 MED ORDER — LOSARTAN POTASSIUM 25 MG PO TABS
12.5000 mg | ORAL_TABLET | Freq: Every evening | ORAL | Status: DC
  Administered 2016-12-09: 12.5 mg via ORAL
  Filled 2016-12-09 (×2): qty 1

## 2016-12-09 MED ORDER — ALPRAZOLAM 0.25 MG PO TABS
0.2500 mg | ORAL_TABLET | Freq: Two times a day (BID) | ORAL | Status: DC | PRN
  Administered 2016-12-12: 0.25 mg via ORAL
  Filled 2016-12-09: qty 1

## 2016-12-09 MED ORDER — BISOPROLOL FUMARATE 5 MG PO TABS
2.5000 mg | ORAL_TABLET | ORAL | Status: DC
  Administered 2016-12-10: 2.5 mg via ORAL
  Filled 2016-12-09 (×2): qty 1

## 2016-12-09 MED ORDER — SODIUM CHLORIDE 0.9% FLUSH
3.0000 mL | Freq: Two times a day (BID) | INTRAVENOUS | Status: DC
  Administered 2016-12-10 – 2016-12-12 (×3): 3 mL via INTRAVENOUS

## 2016-12-09 MED ORDER — INSULIN GLARGINE 100 UNIT/ML ~~LOC~~ SOLN
35.0000 [IU] | Freq: Every morning | SUBCUTANEOUS | Status: DC
  Administered 2016-12-10 – 2016-12-11 (×2): 35 [IU] via SUBCUTANEOUS
  Filled 2016-12-09 (×3): qty 0.35

## 2016-12-09 MED ORDER — MYCOPHENOLATE MOFETIL 250 MG PO CAPS
750.0000 mg | ORAL_CAPSULE | Freq: Two times a day (BID) | ORAL | Status: DC
  Administered 2016-12-09 – 2016-12-12 (×6): 750 mg via ORAL
  Filled 2016-12-09 (×6): qty 3

## 2016-12-09 NOTE — ED Triage Notes (Signed)
CO SOB and generalized weakness for the past 3 days. N/V for the past 3 days. Hy of CHF, diabetes, asthma. sts her chest hurts from vomiting so much.

## 2016-12-09 NOTE — Consult Note (Signed)
CARDIOLOGY CONSULT NOTE   Patient ID: Makayla Vasquez MRN: [id number] DOB/AGE: 1981/08/17 36 y.o.  Admit date: 12/09/2016  Primary Physician   Adin Hector, MD Primary Cardiologist   Dr Aundra Dubin 05/15/2016 Reason for Consultation   CHF Requesting MD: Dr Kohut  BUL:[id number] Makayla Vasquez is a 36 y.o. 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 Home O2. Lung disease has limited b-blocker dosing. Legs weak, likely related to her anti-synthetase syndrome. Followed by Rheumatology.  Echo in 05/2016 showed EF 50-55%, mild LVH    Admission in 5/17 for ?sepsis and management of interstitial lung disease.   Optivol: fluid index > threshold with impedance trending down.   ECG (3/17) with NSR, BiV pacing rate 95  PFTs (12/15) showed a severe restrictive defect concerning for an interstitial process.   EP study 10/15 for persistent tachycardia and found to have sinus tachycardia.   06/16/14 CT scan showed bilateral progressive lower peribronchial infiltrates with groundglass changes and nodular patter consistent with acute pneumonitis of unclear etiology.  06/20/14 BAL showed 60% macrophages and 30% PMNs. Her AFB smear, fungal smear, legionella Ab, pneumocystis smear, ACE, and sputum culture are negative. Her AFB, legionella, fungal, and BAL culture as well as her fungal Ab, hypersensitivity pneumonitis panel were all negative Serology: DsDNA, RF, ANCA, HSP all negative  RHC 10/26/2014  RA = 2 RV = 27/1/5 PA = 29/7 (18) PCW = 4 Fick cardiac output/index = 4.5/2.2 PVR = 3.0 WU FA sat = 98% PA sat = 64%, 61%  11/27/2016 office visit, had been off Lasix, restarted at 20 mg qod, BMET 10 days, bisoprolol 2.5 mg and losartan 12.5 mg restarted.   12/09/2016 ER visit for SOB,  Vomiting, chest pain from this and weakness. Cards asked to see.  Pt has not been able to weigh daily, scales are broken. She does not feel like she has gained any weight. No LE edema, no orthopnea or PND. She developed increased cough, productive of whitish sputum. She has felt like she was having fevers, did not check. She was coughing so hard, she was vomiting, so has not been eating. She was drinking water, about 2 Makayla/day.   The last 2 days, she has not been able to eat at all. She was feeling worse, abdominal pain, ?from vomiting, so came to the ER. She has no dysuria but her abdominal pain includes her lower abdomen.   She feels her HR increase when she is vomiting or coughing hard, but it is regular.     Past Medical History:  Diagnosis Date  . Anxiety   . Arthritis    rheumatoid arthritis- mild, no rheumatology care   . Asthma    seasonal allergies   . Asymptomatic LV dysfunction    a. Echo in Dec 2011 with EF 35 to 40%. Felt to be due to paced rhythm. b. EF 25-30% in 07/2014.  . Bipolar affective disorder ([place])   . Cardiac pacemaker    a. Since age 28 in 29. b. Upgrade to BiV in 2013.  [name] Carpal tunnel syndrome of right wrist   . CHF (congestive heart failure) ([place])   . Congenital complete AV block   . Depression    bipolar  . Diabetes mellitus without complication ([place])   . GERD (  gastroesophageal reflux disease)   . Hypertension   . Lupus   . Lupus (systemic lupus erythematosus) ([place])   . Obesity   . Pneumonitis    a. a/w hypoxia - inflammatory - large workup 07/2014.  . Presence of permanent cardiac pacemaker   . Seizures ([place])    as a child- from high fever  . Sinus tachycardia      Past Surgical History:  Procedure Laterality Date  . ATRIAL TACH ABLATION N/A 08/14/2014   Procedure: ATRIAL TACH ABLATION;  Surgeon: [name], MD;  Location: [facility] CATH LAB;  Service: Cardiovascular;  Laterality: N/A;  . BI-VENTRICULAR PACEMAKER UPGRADE N/A 03/08/2012    Procedure: BI-VENTRICULAR PACEMAKER UPGRADE;  Surgeon: [name], MD;  Location: [facility] CATH LAB;  Service: Cardiovascular;  Laterality: N/A;  . CARPAL TUNNEL WITH CUBITAL TUNNEL Right 07/26/2013   Procedure: RIGHT LIMITED OPEN CARPAL TUNNEL RELEASE ,  RIGHT CUBITAL TUNNEL RELEASE, INSITU VERSES ULNAR NERVE DECOMPRESSION AND ANTERIOR TRANSPOSITION;  Surgeon: [name], MD;  Location: [place];  Service: Orthopedics;  Laterality: Right;  . CESAREAN SECTION    . CHOLECYSTECTOMY    . CYST EXCISION  12/10/2012   THYROID  . INSERT / REPLACE / REMOVE PACEMAKER     2001  . IUD REMOVAL  11/03/2011   Procedure: INTRAUTERINE DEVICE (IUD) REMOVAL;  Surgeon: [name], MD;  Location: [facility];  Service: Gynecology;  Laterality: N/A;  . NASAL FRACTURE SURGERY     /w plate   . RIGHT HEART CATHETERIZATION N/A 10/26/2014   Procedure: RIGHT HEART CATH;  Surgeon: [name], MD;  Location: [facility] CATH LAB;  Service: Cardiovascular;  Laterality: N/A;  . THROAT SURGERY  1994   s/p laser treatment  . THYROGLOSSAL DUCT CYST N/A 12/10/2012   Procedure: REVISION OF THYROGLOSSAL DUCT CYST EXCISION;  Surgeon: [name], MD;  Location: [place];  Service: ENT;  Laterality: N/A;  Revision of Thyroglossal Duct Cyst Excision  . VIDEO BRONCHOSCOPY Bilateral 06/19/2014   Procedure: VIDEO BRONCHOSCOPY WITHOUT FLUORO;  Surgeon: Brand Males, MD;  Location: [place];  Service: Cardiopulmonary;  Laterality: Bilateral;    Allergies  Allergen Reactions  . 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 Sertraline Hcl Hives  . Tape Other (See Comments)    Burns skin    I have reviewed the patient's current medications Medication Sig  bisoprolol (ZEBETA) 5 MG tablet Take 0.5 tablets (2.5 mg total) by mouth every morning.  Blood Glucose Monitoring Suppl (ONE TOUCH ULTRA 2) w/Device KIT Check blood sugar before meals and at bedtim Patient taking  differently: Check blood sugar before meals and at bedtime  fluocinonide (LIDEX) 0.05 % external solution 1 APPLICATION APPLY ON THE SKIN AS DIRECTED APPLY TO AFFECTED AREAS DAILY ON SCALP AS NEEDED FOR ITCHING.  furosemide (LASIX) 20 MG tablet Take 1 tablet (20 mg total) by mouth every other day.  Glycopyrrolate-Formoterol (BEVESPI AEROSPHERE) 9-4.8 MCG/ACT AERO Inhale 2 puffs into the lungs 2 (two) times daily.  insulin aspart (NOVOLOG) 100 UNIT/ML FlexPen Inject 8 Units into the skin 3 (three) times daily with meals.  Insulin Glargine (LANTUS SOLOSTAR) 100 UNIT/ML Solostar Pen Inject 35 Units into the skin daily. Patient taking differently: Inject 35 Units into the skin every morning.   ipratropium (ATROVENT) 0.06 % nasal spray Place 2 sprays into both nostrils 4 (four) times daily.  losartan (COZAAR) 25 MG tablet Take 0.5 tablets (12.5  mg total) by mouth every evening.  pantoprazole (PROTONIX) 40 MG tablet Take 1 tablet (40 mg total) by mouth daily.  predniSONE (DELTASONE) 5 MG tablet Take 17.5 mg by mouth daily with breakfast.      Social History   Social History  . Marital status: Single    Spouse name: N/A  . Number of children: 1  . Years of education: N/A   Occupational History  . CNA    Social History Main Topics  . Smoking status: Former Smoker    Packs/day: 0.25    Years: 0.50    Types: Cigarettes    Quit date: 07/25/1996  . Smokeless tobacco: Never Used  . Alcohol use No  . Drug use: No  . Sexual activity: Yes    Birth control/ protection: Implant, IUD     Comment: placed 04/2011   Other Topics Concern  . Not on file   Social History Narrative   Works at [employer].      Family Status  Relation Status  . Mother Alive  . Father Alive  . Sister Alive   Family History  Problem Relation Age of Onset  . Heart disease Mother     CHF (no details)  . Hypertension Mother   . Lupus Mother   . Heart disease Father     Murmur  . Heart disease Sister 15     No  details.  History of a pacemaker     ROS:  Full 14 point review of systems complete and found to be negative unless listed above.  Physical Exam: Blood pressure (!) 128/47, pulse 82, temperature 98.8 F (37.1 C), temperature source Oral, resp. rate (!) 28, weight 138 lb 1 oz (62.6 kg), last menstrual period 12/09/2016, SpO2 100 %.  General: Well developed, well nourished, female in no acute distress Head: Eyes PERRLA, No xanthomas.   Normocephalic and atraumatic, oropharynx without edema or exudate. Dentition: poor, piercings without signs of infection. Lungs: decreased BS bases but clear Heart: HRRR S1 S2, no rub/gallop, no murmur. pulses are 2+ all 4 extrem.   Neck: No carotid bruits. No lymphadenopathy.  JVD not elevated Abdomen: Bowel sounds present, abdomen soft and diffusely tender without masses or hernias noted. Msk:  No spine or cva tenderness. No weakness, no joint deformities or effusions. Extremities: No clubbing or cyanosis. No edema.  Neuro: Alert and oriented X 3. No focal deficits noted. Psych:  Good affect, responds appropriately Skin: No rashes or lesions noted.  Labs:   Lab Results  Component Value Date   WBC 10.6 (H) 12/09/2016   HGB 10.9 (Makayla) 12/09/2016   HCT 35.4 (Makayla) 12/09/2016   MCV 81.9 12/09/2016   PLT 410 (H) 12/09/2016     Recent Labs Lab 12/09/16 1510  NA 135  K 4.5  CL 101  CO2 22  BUN 10  CREATININE 0.65  CALCIUM 8.9  GLUCOSE 160*   Magnesium  Date Value Ref Range Status  12/09/2016 1.8 1.7 - 2.4 mg/dL Final    Recent Labs  12/09/16 1510  TROPONINI 0.05*   B Natriuretic Peptide  Date/Time Value Ref Range Status  12/18/2015 02:45 PM 345.4 (H) 0.0 - 100.0 pg/mL Final    Echo: 05/26/2016 - Left ventricle: The cavity size was normal. There was mild focal   basal hypertrophy of the septum. Systolic function was normal.   The estimated ejection fraction was in the range of 50% to 55%.   Wall motion was normal; there were no  regional  wall motion   abnormalities. Left ventricular diastolic function parameters   were normal. - Aortic valve: Transvalvular velocity was within the normal range.   There was no stenosis. There was no regurgitation. - Mitral valve: Transvalvular velocity was within the normal range.   There was no evidence for stenosis. There was mild regurgitation. - Right ventricle: The cavity size was normal. Wall thickness was   normal. Systolic function was normal. - Tricuspid valve: There was mild regurgitation. - Pulmonic valve: There was mild regurgitation. - Pulmonary arteries: Systolic pressure was mildly to moderately   increased. PA peak pressure: 48 mm Hg (S).  ECG:  12/09/2016 ISinus rhythm  ntermittent paced beats  Cath: see HPI  Radiology:  Dg Chest 2 View Result Date: 12/09/2016 CLINICAL DATA:  Dyspnea.  Midline chest pain.  Congenital AV block. EXAM: CHEST  2 VIEW COMPARISON:  11/24/2016. FINDINGS: The heart is enlarged. Pacemaker leads are attached the RIGHT atrium, RIGHT ventricle, and coronary sinus. Interstitial prominence bilaterally is redemonstrated, slightly worse, likely edema. No appreciable airspace consolidation or pleural effusion. IMPRESSION: Slight worsening aeration.  Correlate clinically for CHF. Electronically Signed   By: Staci Righter M.D.   On: 12/09/2016 15:48    ASSESSMENT AND PLAN:   The patient was seen today by Dr Harrington Challenger, the patient evaluated and the data reviewed.   Principal Problem:   UTI (urinary tract infection) - ck culture - Pharmacy recommends starting w/ Rocephin, Will start and wait until cx results are back  Active Problems:   ILD (interstitial lung disease) ([place]) - cough has been worse than usual, CXR w/ slightly worsening aeration but no overload on exam.  - follow, ABX may help    CHF (congestive heart failure) ([place]) - need daily weights, strict I/O   Patient may be a little dry   - Hold home Lasix for now - mild hydration as urine is  hemoconcentrated  Reassess in AM      Type 2 diabetes mellitus with hyperglycemia, with long-term current use of insulin (HCC) - SSI, ck A1c    On prednisone therapy - follow, continue Cellcept as well   SignedLenoard Aden 12/09/2016 6:54 PM Beeper [phone]  Pt seen and examined  I agree with findings as noted above by R Barrett  I have amended to reflect my findings Pt is a 36 yo with NICM, AV block , probable autoimmune lung dz  Presetns with complaints of abdominal pain, n/V   Overall feeling bad.  Breathing is stable  Has not had much t oeat On exam:  JVP is normal  Lungs CTA  Cardiac Exam  RRR  Soft S3   Ext without edema Labs signif for normal WBC  UA with evid o UTI  Reviewed with pharmacy  Will begin IV Abx Give small amount of IV fluid  Pt may be a little dry  Hold lasix     Dorris Carnes

## 2016-12-09 NOTE — ED Notes (Signed)
Spoke with lab about adding on the magnesium and trop 1  Lab mentioned something about a CHL machine, not sure what that is and she stated she will get to it later

## 2016-12-09 NOTE — Progress Notes (Signed)
Patient arrived to 3E11 from [facility]. A&O x4. VSS. V-paced on telemetry. No skin breakdown or wounds. No complaints of pain. Patient oriented to room and call system.

## 2016-12-09 NOTE — ED Provider Notes (Signed)
[facility] DEPT Provider Note   CSN: [id number] Arrival date & time: 12/09/16  1407  By signing my name below, I, Sonum Patel, attest that this documentation has been prepared under the direction and in the presence of Virgel Manifold, MD. Electronically Signed: Sonum Patel, Education administrator. 12/09/16. 2:48 PM.  History   Chief Complaint Chief Complaint  Patient presents with  . Shortness of Breath    The history is provided by the patient. No language interpreter was used.     HPI Comments: Makayla Vasquez is a 36 y.o. female who presents to the Emergency Department complaining of gradually worsened generalized weakness for the past 3 days. She has associated SOB, subjective fever, nausea, vomiting, cough, back pain, chest pain, and slight dysuria for the same period of time. She further notes feeling heart-racing palpitations. She has sick contacts with flu symptoms. She has a history of congenital complete AV block, CHF, and has a permanent pacemaker which was last replaced in 2004. She denies SOB, lightheadedness.   Past Medical History:  Diagnosis Date  . Anxiety   . Arthritis    rheumatoid arthritis- mild, no rheumatology care   . Asthma    seasonal allergies   . Asymptomatic LV dysfunction    a. Echo in Dec 2011 with EF 35 to 40%. Felt to be due to paced rhythm. b. EF 25-30% in 07/2014.  . Bipolar affective disorder ([place])   . Cardiac pacemaker    a. Since age 65 in 93. b. Upgrade to BiV in 2013.  [name] Carpal tunnel syndrome of right wrist   . CHF (congestive heart failure) ([place])   . Congenital complete AV block   . Depression    bipolar  . Diabetes mellitus without complication ([place])   . GERD (gastroesophageal reflux disease)   . Hypertension   . Lupus   . Lupus (systemic lupus erythematosus) ([place])   . Obesity   . Pneumonitis    a. a/w hypoxia - inflammatory - large workup 07/2014.  . Presence of permanent cardiac pacemaker   . Seizures ([place])    as a child- from high fever   . Sinus tachycardia     Patient Active Problem List   Diagnosis Date Noted  . 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 Community acquired pneumonia   . Physical deconditioning   . Lactic acidosis   . Leukocytosis   . On Cellcept therapy   . On prednisone therapy   . Systemic lupus erythematosus ([place])   . Fever in adult   . Community acquired bacterial pneumonia 11/01/2016  . Right knee pain 10/16/2016  . Long term (current) use of systemic steroids   . History of heart failure   . Hyperglycemia 09/23/2016  . Breast pain, right 04/14/2016  . Chronic diastolic congestive heart failure ([place])   . Bacteremia 02/24/2016  . Sepsis ([place]) 02/16/2016  . Myalgia and myositis   . Type 2 diabetes mellitus with hyperglycemia, with long-term current use of insulin ([place])   . Anemia of chronic disease   . Bipolar affective disorder in remission ([place])   . Generalized weakness   . Acute blood loss anemia   . Cardiac pacemaker in situ   . Adrenal insufficiency ([place])   . Acute on chronic diastolic CHF (congestive heart failure) ([place])   . 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 Hypotension 07/08/2015  . Hyperlipidemia associated with type 2 diabetes mellitus ([place]) 05/04/2015  . Screening for cervical cancer 01/19/2015  . Onychomycosis 12/14/2014  .  CHF (congestive heart failure) ([place])   . Diabetes mellitus type 2 in obese ([place])   . Chronic respiratory failure with hypoxia ([place]) 08/07/2014  . Insertion of implantable subdermal contraceptive 07/12/2014  . ILD (interstitial lung disease) ([place]) 06/19/2014  . Congestive dilated cardiomyopathy ([place]) 05/31/2014  . Vaginal itching 05/22/2014  . Constipated 03/03/2012  . Chronic systolic heart failure ([place]) 02/26/2012  . Intermittent abdominal pain 02/18/2012  . PPM-Medtronic   . Congenital complete AV block   . GERD (gastroesophageal reflux disease)   . THYROGLOSSAL DUCT CYST 07/18/2010  . RH FACTOR, NEGATIVE 04/08/2010  . Mild intermittent asthma  without complication 56/31/4970  . POLYCYSTIC OVARY 12/17/2006    Past Surgical History:  Procedure Laterality Date  . ATRIAL TACH ABLATION N/A 08/14/2014   Procedure: ATRIAL TACH ABLATION;  Surgeon: [name], MD;  Location: [facility] CATH LAB;  Service: Cardiovascular;  Laterality: N/A;  . BI-VENTRICULAR PACEMAKER UPGRADE N/A 03/08/2012   Procedure: BI-VENTRICULAR PACEMAKER UPGRADE;  Surgeon: [name], MD;  Location: [facility] CATH LAB;  Service: Cardiovascular;  Laterality: N/A;  . CARPAL TUNNEL WITH CUBITAL TUNNEL Right 07/26/2013   Procedure: RIGHT LIMITED OPEN CARPAL TUNNEL RELEASE ,  RIGHT CUBITAL TUNNEL RELEASE, INSITU VERSES ULNAR NERVE DECOMPRESSION AND ANTERIOR TRANSPOSITION;  Surgeon: [name], MD;  Location: [facility];  Service: Orthopedics;  Laterality: Right;  . CESAREAN SECTION    . CHOLECYSTECTOMY    . CYST EXCISION  12/10/2012   THYROID  . INSERT / REPLACE / REMOVE PACEMAKER     2001  . IUD REMOVAL  11/03/2011   Procedure: INTRAUTERINE DEVICE (IUD) REMOVAL;  Surgeon: [name], MD;  Location: [facility];  Service: Gynecology;  Laterality: N/A;  . NASAL FRACTURE SURGERY     /w plate   . RIGHT HEART CATHETERIZATION N/A 10/26/2014   Procedure: RIGHT HEART CATH;  Surgeon: [name], MD;  Location: [facility] CATH LAB;  Service: Cardiovascular;  Laterality: N/A;  . THROAT SURGERY  1994   s/p laser treatment  . THYROGLOSSAL DUCT CYST N/A 12/10/2012   Procedure: REVISION OF THYROGLOSSAL DUCT CYST EXCISION;  Surgeon: [name], MD;  Location: [place];  Service: ENT;  Laterality: N/A;  Revision of Thyroglossal Duct Cyst Excision  . VIDEO BRONCHOSCOPY Bilateral 06/19/2014   Procedure: VIDEO BRONCHOSCOPY WITHOUT FLUORO;  Surgeon: Brand Males, MD;  Location: [facility];  Service: Cardiopulmonary;  Laterality: Bilateral;    OB History    Gravida Para Term Preterm AB Living   1 1 1     1    SAB TAB Ectopic Multiple Live Births                   Home Medications    Prior to  Admission medications   Medication Sig Start Date End Date Taking? Authorizing Provider  bisoprolol (ZEBETA) 5 MG tablet Take 0.5 tablets (2.5 mg total) by mouth every morning. 11/27/16   [name], MD  Blood Glucose Monitoring Suppl (ONE TOUCH ULTRA 2) w/Device KIT Check blood sugar before meals and at bedtim Patient taking differently: Check blood sugar before meals and at bedtime 12/27/15   [name], PA-C  fluocinonide (LIDEX) 0.05 % external solution 1 APPLICATION APPLY ON THE SKIN AS DIRECTED APPLY TO AFFECTED AREAS DAILY ON SCALP AS NEEDED FOR ITCHING. 09/28/15   Historical Provider, MD  furosemide (LASIX) 20 MG tablet Take 1 tablet (20 mg total) by mouth every other day. 11/27/16 02/25/17  [name], MD  Glycopyrrolate-Formoterol (BEVESPI AEROSPHERE) 9-4.8 MCG/ACT AERO Inhale 2 puffs into the lungs 2 (two) times daily. 11/24/16   Deneise Lever, MD  insulin aspart (NOVOLOG) 100 UNIT/ML FlexPen Inject 8 Units into the skin 3 (three) times daily with meals. 09/27/16   Eloise Levels, MD  Insulin Glargine (LANTUS SOLOSTAR) 100 UNIT/ML Solostar Pen Inject 35 Units into the skin daily. Patient taking differently: Inject 35 Units into the skin every morning.  10/06/16   Elberta Leatherwood, MD  ipratropium (ATROVENT) 0.06 % nasal spray Place 2 sprays into both nostrils 4 (four) times daily. 10/25/16   Billy Fischer, MD  losartan (COZAAR) 25 MG tablet Take 0.5 tablets (12.5 mg total) by mouth every evening. 11/27/16 02/25/17  [name], MD  pantoprazole (PROTONIX) 40 MG tablet Take 1 tablet (40 mg total) by mouth daily. 04/07/16   [name], DO  predniSONE (DELTASONE) 5 MG tablet Take 17.5 mg by mouth daily with breakfast.     Historical Provider, MD    Family History Family History  Problem Relation Age of Onset  . Heart disease Mother     CHF (no details)  . Hypertension Mother   . Lupus Mother   . Heart disease Father     Murmur  . Heart disease Sister 48     No details.   History of a pacemaker    Social History Social History  Substance Use Topics  . Smoking status: Former Smoker    Packs/day: 0.25    Years: 0.50    Types: Cigarettes    Quit date: 07/25/1996  . Smokeless tobacco: Never Used  . Alcohol use No     Allergies   Atorvastatin; Sertraline hcl; and Tape   Review of Systems Review of Systems  A complete 10 system review of systems was obtained and all systems are negative except as noted in the HPI and PMH.    Physical Exam Updated Vital Signs BP (!) 128/47   Pulse 82   Temp 98.8 F (37.1 C) (Oral)   Resp (!) 28   SpO2 100% Comment: 3L  Physical Exam  Constitutional: She is oriented to person, place, and time. She appears well-developed and well-nourished. No distress.  Tired appearing but non toxic.   HENT:  Head: Normocephalic and atraumatic.  Eyes: EOM are normal.  Neck: Normal range of motion.  Cardiovascular: Regular rhythm and normal heart sounds.  Tachycardia present.   Pulmonary/Chest: Effort normal and breath sounds normal. Tachypnea noted. No respiratory distress. She has no decreased breath sounds. She has no wheezes. She has no rhonchi. She has no rales.  Abdominal: Soft. She exhibits no distension. There is tenderness (mild, diffuse). There is no rebound.  Musculoskeletal: Normal range of motion.  Neurological: She is alert and oriented to person, place, and time.  Skin: Skin is warm and dry.  Psychiatric: She has a normal mood and affect. Judgment normal.  Nursing note and vitals reviewed.    ED Treatments / Results  DIAGNOSTIC STUDIES: Oxygen Saturation is 100% on [place] 3L/min, normal by my interpretation.    COORDINATION OF CARE: 2:32 PM Discussed treatment plan with pt at bedside and pt agreed to plan.   Labs (all labs ordered are listed, but only abnormal results are displayed) Labs Reviewed  CBC WITH DIFFERENTIAL/PLATELET - Abnormal; Notable for the following:       Result Value   WBC 10.6 (*)     Hemoglobin 10.9 (*)    HCT  35.4 (*)    MCH 25.2 (*)    RDW 17.3 (*)    Platelets 410 (*)    Neutro Abs 7.9 (*)    All other components within normal limits  BASIC METABOLIC PANEL - Abnormal; Notable for the following:    Glucose, Bld 160 (*)    All other components within normal limits  URINALYSIS, ROUTINE W REFLEX MICROSCOPIC - Abnormal; Notable for the following:    Color, Urine AMBER (*)    APPearance HAZY (*)    Hgb urine dipstick SMALL (*)    Bilirubin Urine SMALL (*)    Ketones, ur 20 (*)    Protein, ur 30 (*)    Nitrite POSITIVE (*)    Leukocytes, UA TRACE (*)    Bacteria, UA MANY (*)    Squamous Epithelial / LPF 0-5 (*)    All other components within normal limits  TROPONIN I - Abnormal; Notable for the following:    Troponin I 0.05 (*)    All other components within normal limits  COMPREHENSIVE METABOLIC PANEL - Abnormal; Notable for the following:    Glucose, Bld 133 (*)    Calcium 8.3 (*)    Total Protein 6.3 (*)    Albumin 2.3 (*)    All other components within normal limits  GLUCOSE, CAPILLARY - Abnormal; Notable for the following:    Glucose-Capillary 106 (*)    All other components within normal limits  GLUCOSE, CAPILLARY - Abnormal; Notable for the following:    Glucose-Capillary 111 (*)    All other components within normal limits  GLUCOSE, CAPILLARY - Abnormal; Notable for the following:    Glucose-Capillary 240 (*)    All other components within normal limits  GLUCOSE, CAPILLARY - Abnormal; Notable for the following:    Glucose-Capillary 203 (*)    All other components within normal limits  GLUCOSE, CAPILLARY - Abnormal; Notable for the following:    Glucose-Capillary 189 (*)    All other components within normal limits  GLUCOSE, CAPILLARY - Abnormal; Notable for the following:    Glucose-Capillary 112 (*)    All other components within normal limits  GLUCOSE, CAPILLARY - Abnormal; Notable for the following:    Glucose-Capillary 183 (*)    All  other components within normal limits  GLUCOSE, CAPILLARY - Abnormal; Notable for the following:    Glucose-Capillary 210 (*)    All other components within normal limits  GLUCOSE, CAPILLARY - Abnormal; Notable for the following:    Glucose-Capillary 125 (*)    All other components within normal limits  CBC - Abnormal; Notable for the following:    RBC 3.74 (*)    Hemoglobin 9.2 (*)    HCT 31.4 (*)    MCH 24.6 (*)    MCHC 29.3 (*)    RDW 17.2 (*)    All other components within normal limits  BASIC METABOLIC PANEL - Abnormal; Notable for the following:    Potassium 3.2 (*)    Glucose, Bld 178 (*)    Calcium 8.6 (*)    All other components within normal limits  GLUCOSE, CAPILLARY - Abnormal; Notable for the following:    Glucose-Capillary 106 (*)    All other components within normal limits  MRSA PCR SCREENING  MAGNESIUM  BRAIN NATRIURETIC PEPTIDE  OCCULT BLOOD X 1 CARD TO LAB, STOOL  GLUCOSE, CAPILLARY  I-STAT BETA HCG BLOOD, ED (MC, [facility], AP ONLY)    EKG  EKG Interpretation  Date/Time:  Tuesday December 09 2016 15:10:20 EST Ventricular Rate:  81 PR Interval:    QRS Duration: 87 QT Interval:  595 QTC Calculation: 683 R Axis:   93 Text Interpretation:  Atrial-sensed ventricular-paced complexes No further rhythm analysis attempted due to paced rhythm Borderline right axis deviation Probable LVH with secondary repol abnrm ST elevation, consider inferior injury Prolonged QT interval Confirmed by [facility] MD, JULIE ([id number]) on 12/10/2016 11:27:46 AM       Radiology No results found.  Procedures Procedures (including critical care time)  Medications Ordered in ED Medications - No data to display   Initial Impression / Assessment and Plan / ED Course  I have reviewed the triage vital signs and the nursing notes.  Pertinent labs & imaging results that were available during my care of the patient were reviewed by me and considered in my medical decision making (see chart  for details).     35yF with n/v. She does have fatigue/weakness and dyspnea but to some degree this is baseline. Hx of congential complete HB s/p PM. Also connective tissue disorder and underlying lung disease with baseline oxygen usage. She feels like her dyspnea is near her baseline. She is fatigued but is not normally very active on her better days. She doesn't weigh herself regularly because her scale broke. If anything she feels like her weight might be down a little. 143 lbs a couple weeks ago during. Will weigh today. Check BNP  Final Clinical Impressions(s) / ED Diagnoses   Final diagnoses:  Diabetes mellitus type 2 in obese (HCC)  Acute on chronic diastolic CHF (congestive heart failure) (HCC)    New Prescriptions New Prescriptions   No medications on file   I personally preformed the services scribed in my presence. The recorded information has been reviewed is accurate. Virgel Manifold, MD.    Virgel Manifold, MD 12/24/16 1027

## 2016-12-10 DIAGNOSIS — I950 Idiopathic hypotension: Secondary | ICD-10-CM

## 2016-12-10 DIAGNOSIS — I5030 Unspecified diastolic (congestive) heart failure: Secondary | ICD-10-CM

## 2016-12-10 LAB — COMPREHENSIVE METABOLIC PANEL
ALT: 25 U/L (ref 14–54)
ANION GAP: 11 (ref 5–15)
AST: 35 U/L (ref 15–41)
Albumin: 2.3 g/dL — ABNORMAL LOW (ref 3.5–5.0)
Alkaline Phosphatase: 52 U/L (ref 38–126)
BILIRUBIN TOTAL: 1.2 mg/dL (ref 0.3–1.2)
BUN: 9 mg/dL (ref 6–20)
CO2: 22 mmol/L (ref 22–32)
Calcium: 8.3 mg/dL — ABNORMAL LOW (ref 8.9–10.3)
Chloride: 102 mmol/L (ref 101–111)
Creatinine, Ser: 0.74 mg/dL (ref 0.44–1.00)
GFR calc Af Amer: >60 mL/min (ref >60)
Glucose, Bld: 133 mg/dL — ABNORMAL HIGH (ref 65–99)
POTASSIUM: 3.7 mmol/L (ref 3.5–5.1)
Sodium: 135 mmol/L (ref 135–145)
TOTAL PROTEIN: 6.3 g/dL — AB (ref 6.5–8.1)

## 2016-12-10 LAB — GLUCOSE, CAPILLARY
GLUCOSE-CAPILLARY: 111 mg/dL — AB (ref 65–99)
GLUCOSE-CAPILLARY: 240 mg/dL — AB (ref 65–99)
Glucose-Capillary: 203 mg/dL — ABNORMAL HIGH (ref 65–99)
Glucose-Capillary: 189 mg/dL — ABNORMAL HIGH (ref 65–99)

## 2016-12-10 MED ORDER — INSULIN ASPART 100 UNIT/ML ~~LOC~~ SOLN: 0.0000 [IU] | Freq: Every day | SUBCUTANEOUS | Status: DC

## 2016-12-10 MED ORDER — OXYCODONE-ACETAMINOPHEN 5-325 MG PO TABS
1.0000 | ORAL_TABLET | ORAL | Status: DC | PRN
Start: 2016-12-10 — End: 2016-12-12
  Administered 2016-12-10: 1 via ORAL
  Filled 2016-12-10: qty 1

## 2016-12-10 MED ORDER — SODIUM CHLORIDE 0.9 % IV BOLUS (SEPSIS)
250.0000 mL | Freq: Once | INTRAVENOUS | Status: AC
  Administered 2016-12-10: 250 mL via INTRAVENOUS

## 2016-12-10 MED ORDER — INSULIN ASPART 100 UNIT/ML ~~LOC~~ SOLN
0.0000 [IU] | Freq: Three times a day (TID) | SUBCUTANEOUS | Status: DC
  Administered 2016-12-10 – 2016-12-11 (×3): 3 [IU] via SUBCUTANEOUS
  Administered 2016-12-11: 2 [IU] via SUBCUTANEOUS

## 2016-12-10 NOTE — Progress Notes (Signed)
Patient refused bed alarm. Will continue to monitor patient. 

## 2016-12-10 NOTE — Progress Notes (Signed)
Advanced Heart Failure Rounding Note   Subjective:    Evaluated in the HF clinic 11/27/16. Mild volume overload so low dose lasix added. Also bisoprolol and losartan.   Admitted with increased dyspnea, vomiting, and chest pain. Prior to admit N/V x2 days. Weight down from baseline ~144-146 pounds--->137 pounds. Poor po intake. Diuretics held on admit and given IV fluids.  BNP was not elevated.   Denies SOB/Dizziness. Appetite ok.   ECHO 05/2016 EF 50-55%   Objective:   Weight Range:  Vital Signs:   Temp:  [98.3 F (36.8 C)-103 F (39.4 C)] 98.8 F (37.1 C) (02/21 0724) Pulse Rate:  [48-96] 86 (02/21 0909) Resp:  [18-28] 18 (02/21 0724) BP: (70-128)/(40-90) 89/53 (02/21 0909) SpO2:  [92 %-100 %] 100 % (02/21 0724) Weight:  [136 lb 9.6 oz (62 kg)-138 lb 1 oz (62.6 kg)] 137 lb 9.6 oz (62.4 kg) (02/21 0521) Last BM Date: 12/08/16  Weight change: Filed Weights   12/09/16 1718 12/09/16 2116 12/10/16 0521  Weight: 138 lb 1 oz (62.6 kg) 136 lb 9.6 oz (62 kg) 137 lb 9.6 oz (62.4 kg)    Intake/Output:   Intake/Output Summary (Last 24 hours) at 12/10/16 0923 Last data filed at 12/10/16 0904  Gross per 24 hour  Intake             1135 ml  Output              200 ml  Net              935 ml     Physical Exam: Orthostatics  Sitting 90/53 Standing 88/54   General:  Well appearing. No resp difficulty. In bed  HEENT: normal Neck: supple. JVP flat  . Carotids 2+ bilat; no bruits. No lymphadenopathy or thryomegaly appreciated. Cor: PMI nondisplaced. Regular rate & rhythm. No rubs, gallops or murmurs. Lungs: clear Abdomen: soft, nontender, nondistended. No hepatosplenomegaly. No bruits or masses. Good bowel sounds. Extremities: no cyanosis, clubbing, rash, edema Neuro: alert & orientedx3, cranial nerves grossly intact. moves all 4 extremities w/o difficulty. Affect pleasant  Telemetry: V paced 90s   Labs: Basic Metabolic Panel:  Recent Labs Lab 12/09/16 1510  12/10/16 0545  NA 135 135  K 4.5 3.7  CL 101 102  CO2 22 22  GLUCOSE 160* 133*  BUN 10 9  CREATININE 0.65 0.74  CALCIUM 8.9 8.3*  MG 1.8  --     Liver Function Tests:  Recent Labs Lab 12/10/16 0545  AST 35  ALT 25  ALKPHOS 52  BILITOT 1.2  PROT 6.3*  ALBUMIN 2.3*   No results for input(s): LIPASE, AMYLASE in the last 168 hours. No results for input(s): AMMONIA in the last 168 hours.  CBC:  Recent Labs Lab 12/09/16 1510  WBC 10.6*  NEUTROABS 7.9*  HGB 10.9*  HCT 35.4*  MCV 81.9  PLT 410*    Cardiac Enzymes:  Recent Labs Lab 12/09/16 1510  TROPONINI 0.05*    BNP: BNP (last 3 results)  Recent Labs  12/18/15 1445 02/16/16 1149 12/09/16 1510  BNP 345.4* 56.9 21.9    ProBNP (last 3 results) No results for input(s): PROBNP in the last 8760 hours.    Other results:  Imaging: Dg Chest 2 View  Result Date: 12/09/2016 CLINICAL DATA:  Dyspnea.  Midline chest pain.  Congenital AV block. EXAM: CHEST  2 VIEW COMPARISON:  11/24/2016. FINDINGS: The heart is enlarged. Pacemaker leads are attached the RIGHT atrium, RIGHT ventricle,  and coronary sinus. Interstitial prominence bilaterally is redemonstrated, slightly worse, likely edema. No appreciable airspace consolidation or pleural effusion. IMPRESSION: Slight worsening aeration.  Correlate clinically for CHF. Electronically Signed   By: Elsie Stain M.D.   On: 12/09/2016 15:48      Medications:     Scheduled Medications: . bisoprolol  2.5 mg Oral BH-q7a  . cefTRIAXone (ROCEPHIN)  IV  1 g Intravenous Q24H  . enoxaparin (LOVENOX) injection  40 mg Subcutaneous Q24H  . insulin aspart  8 Units Subcutaneous TID WC  . insulin glargine  35 Units Subcutaneous q morning - 10a  . losartan  12.5 mg Oral QPM  . magnesium oxide  200 mg Oral Daily  . mycophenolate  750 mg Oral BID  . pantoprazole  40 mg Oral Daily  . predniSONE  17.5 mg Oral Q breakfast  . sodium chloride flush  3 mL Intravenous Q12H      Infusions:   PRN Medications:  sodium chloride, acetaminophen, ALPRAZolam, ipratropium, nitroGLYCERIN, ondansetron (ZOFRAN) IV, oxyCODONE-acetaminophen, sodium chloride flush, zolpidem   Assessment/Plan/Discussion:  1. Chronic Diastolic Heart Failure  Volume status low in the setting of N/V. Hold diuretics. Allow weight to drift up.  SBP soft. She is not orthostatic. Hold bb. Continue arb for now. Hold for SBP < 85.  2. UTI--UA --> on ceftriaxone.  3. ILD- on 2 liters oxygen chronically 4. DM Type II- per primary team.   Length of Stay: 1   Amy Clegg  NP-C  12/10/2016, 9:23 AM  Advanced Heart Failure Team Pager [phone] (M-F; 7a - 4p)  Please contact [facility] for night-coverage after hours (4p -7a ) and weekends on [email or web address]  Patient seen with NP, agree with the above note.  In hospital with UTI, nausea, vomiting.  BP down in this setting.  Continue losartan, continue hold Lasix and bisoprolol.   Will get echo this admission.   We will see her shortly after discharge and try to get her back on her meds.   [name] 12/10/2016 10:59 AM

## 2016-12-10 NOTE — Progress Notes (Signed)
At 0824, BP 80/47, HR 96.  Pt informed me that she was given BP meds by PM nurse. AT 0640, bisoprolol 2.5 mg was given. Cardiology notified.  Orders received to give bolus.   Frequent VS dinemap set up, VS documented in Doc flowsheets. After receiving bolus, VS showed minimal BP improvement.  Cardiology aware.

## 2016-12-10 NOTE — Progress Notes (Signed)
Orthostatic VS taken, and documented in Doc flowsheets, with the following results: 0941, lying = 84/49, HR 84 0942, sitting = 84/49, HR 84 0943, standing = 89/53, HR 83 Cardiology aware.

## 2016-12-10 NOTE — Telephone Encounter (Signed)
lmomtcb x 2--need to schedule the pt for 6 min walk test and PFT.

## 2016-12-11 ENCOUNTER — Inpatient Hospital Stay (HOSPITAL_COMMUNITY): Payer: Medicare Other

## 2016-12-11 DIAGNOSIS — I509 Heart failure, unspecified: Secondary | ICD-10-CM

## 2016-12-11 LAB — GLUCOSE, CAPILLARY
Glucose-Capillary: 112 mg/dL — ABNORMAL HIGH (ref 65–99)
Glucose-Capillary: 183 mg/dL — ABNORMAL HIGH (ref 65–99)
Glucose-Capillary: 210 mg/dL — ABNORMAL HIGH (ref 65–99)
Glucose-Capillary: 125 mg/dL — ABNORMAL HIGH (ref 65–99)

## 2016-12-11 LAB — ECHOCARDIOGRAM COMPLETE
HEIGHTINCHES: 65 in
WEIGHTICAEL: 2240 [oz_av]

## 2016-12-11 LAB — OCCULT BLOOD X 1 CARD TO LAB, STOOL: FECAL OCCULT BLD: NEGATIVE

## 2016-12-11 NOTE — Progress Notes (Signed)
  Echocardiogram 2D Echocardiogram has been performed.  Janalyn Harder 12/11/2016, 2:57 PM

## 2016-12-11 NOTE — Consult Note (Signed)
   [facility] Inpatient Consult   12/11/2016  Makayla Vasquez 11-14-1980 [id number]   Patient evaluated for community based chronic disease management services with [facility] Care Management Program as a benefit of patient's Medicare Insurance with HF, and multiple admissions in the past 6 months.   Spoke with patient at bedside to explain [facility] Care Management services. Chart reviewed and patient was admitted with increased dyspnea, vomiting, and chest pain. Prior to admit N/V x2 days. Weight down from baseline ~144-146 pounds--->137 pounds. Poor po intake. Diuretics held on admit and given IV fluids per HF MD notes. Patient denies any trouble with transportation, she gets follow up calls from her MD office and endorses Dr. Brayton Caves as her primary care provider. She states, "It's this time of year when my respiratory gets worst and with my allergies.   Consent form signed and [facility] Care Management folder given with contact information.   Patient will receive post hospital discharge call and will be evaluated for monthly home visits for assessments and disease process education.  Left contact information and [facility] literature at bedside. Of note, [facility] Care Management services does not replace or interfere with any services that are arranged by inpatient case management or social work.  For additional questions or referrals please contact:    Charlesetta Shanks, RN BSN CCM [facility]  [phone] business mobile phone Toll free office [phone]

## 2016-12-12 ENCOUNTER — Other Ambulatory Visit: Payer: Self-pay | Admitting: *Deleted

## 2016-12-12 ENCOUNTER — Other Ambulatory Visit (HOSPITAL_COMMUNITY): Payer: Medicare Other

## 2016-12-12 ENCOUNTER — Ambulatory Visit: Payer: Medicare Other | Admitting: Internal Medicine

## 2016-12-12 DIAGNOSIS — N342 Other urethritis: Secondary | ICD-10-CM

## 2016-12-12 DIAGNOSIS — J849 Interstitial pulmonary disease, unspecified: Secondary | ICD-10-CM

## 2016-12-12 DIAGNOSIS — I5032 Chronic diastolic (congestive) heart failure: Secondary | ICD-10-CM

## 2016-12-12 LAB — GLUCOSE, CAPILLARY
GLUCOSE-CAPILLARY: 76 mg/dL (ref 65–99)
Glucose-Capillary: 106 mg/dL — ABNORMAL HIGH (ref 65–99)

## 2016-12-12 LAB — CBC
HCT: 31.4 % — ABNORMAL LOW (ref 36.0–46.0)
Hemoglobin: 9.2 g/dL — ABNORMAL LOW (ref 12.0–15.0)
MCH: 24.6 pg — AB (ref 26.0–34.0)
MCHC: 29.3 g/dL — AB (ref 30.0–36.0)
MCV: 84 fL (ref 78.0–100.0)
PLATELETS: 368 10*3/uL (ref 150–400)
RBC: 3.74 MIL/uL — AB (ref 3.87–5.11)
RDW: 17.2 % — ABNORMAL HIGH (ref 11.5–15.5)
WBC: 5.3 10*3/uL (ref 4.0–10.5)

## 2016-12-12 LAB — BASIC METABOLIC PANEL
Anion gap: 8 (ref 5–15)
BUN: 8 mg/dL (ref 6–20)
CHLORIDE: 109 mmol/L (ref 101–111)
CO2: 23 mmol/L (ref 22–32)
CREATININE: 0.46 mg/dL (ref 0.44–1.00)
Calcium: 8.6 mg/dL — ABNORMAL LOW (ref 8.9–10.3)
GFR calc Af Amer: >60 mL/min (ref >60)
GLUCOSE: 178 mg/dL — AB (ref 65–99)
POTASSIUM: 3.2 mmol/L — AB (ref 3.5–5.1)
SODIUM: 140 mmol/L (ref 135–145)

## 2016-12-12 MED ORDER — CEFUROXIME AXETIL 500 MG PO TABS: 500.0000 mg | ORAL_TABLET | Freq: Two times a day (BID) | ORAL | 0 refills | Status: DC

## 2016-12-12 MED ORDER — CEFUROXIME AXETIL 500 MG PO TABS: 500.0000 mg | ORAL_TABLET | Freq: Two times a day (BID) | ORAL | Status: DC

## 2016-12-12 MED ORDER — FUROSEMIDE 20 MG PO TABS: 20.0000 mg | ORAL_TABLET | ORAL | 3 refills | Status: DC

## 2016-12-12 NOTE — Discharge Summary (Signed)
Advanced Heart Failure Team  Discharge Summary   Patient ID: Makayla Vasquez MRN: [id number], DOB/AGE: Apr 04, 1981 36 y.o. Admit date: 12/09/2016 D/C date:     12/12/2016   Primary Discharge Diagnoses:  1. UTI 2. Chronic Diastolic Heart Failure 3. ILD  Hospital Course:   Makayla Vasquez is a 36 y.o. 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 Home O2.   Admitted with increased dyspnea, vomiting and weakness. Diuretics and bb held on admit due to hypotension. Urine + UTI. Treated ceftriaxone and transitioned to ceftin. Plan to continue for a total of 7 days. Considered cipro but due to prolonged QT this was not selected. She improved with antibiotics and was able to tolerate a diet.   From HF perspective she was stable. SBP remained soft so bb was held Continue low dose losartan. She will restart lasix on 12/14/16. Plan to follow up in the HF clinic next week. Plan to consider restarting bisoprolol at that time.   ECHO 12/11/2016 -->EF 45-50%    Discharge Weight: 141 pounds Discharge Vitals: Blood pressure (!) 96/55, pulse 80, temperature 98.6 F (37 C), temperature source Oral, resp. rate 18, height 5\' 5"  (1.651 m), weight 141 lb (64 kg), last menstrual period 12/09/2016, SpO2 98 %.  Labs: Lab Results  Component Value Date   WBC 10.6 (H) 12/09/2016   HGB 10.9 (L) 12/09/2016   HCT 35.4 (L) 12/09/2016   MCV 81.9 12/09/2016   PLT 410 (H) 12/09/2016    Recent Labs Lab 12/10/16 0545  NA 135  K 3.7  CL 102  CO2 22  BUN 9  CREATININE 0.74  CALCIUM 8.3*  PROT 6.3*  BILITOT 1.2  ALKPHOS 52  ALT 25  AST 35  GLUCOSE 133*   Lab Results  Component Value Date   CHOL 200 10/24/2014   HDL 31 (L) 10/24/2014   LDLCALC 136 (H) 10/24/2014   TRIG 163 (H) 10/24/2014   BNP  (last 3 results)  Recent Labs  12/18/15 1445 02/16/16 1149 12/09/16 1510  BNP 345.4* 56.9 21.9    ProBNP (last 3 results) No results for input(s): PROBNP in the last 8760 hours.   Diagnostic Studies/Procedures   No results found.  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   fluocinonide 0.05 % external solution Commonly known as:  LIDEX 1 APPLICATION APPLY ON THE SKIN AS DIRECTED APPLY TO AFFECTED AREAS DAILY ON SCALP AS NEEDED FOR ITCHING.   furosemide 20 MG tablet Commonly known as:  LASIX Take 1 tablet (20 mg total) by mouth every other day. Start taking on:  12/14/2016   insulin aspart 100 UNIT/ML FlexPen Commonly known as:  NOVOLOG Inject 8 Units into the skin 3 (three) times daily with meals.  Insulin Glargine 100 UNIT/ML Solostar Pen Commonly known as:  LANTUS SOLOSTAR Inject 35 Units into the skin daily. What changed:  when to take this   ipratropium 0.06 % nasal spray Commonly known as:  ATROVENT Place 2 sprays into both nostrils 4 (four) times daily. What changed:  when to take this  reasons to take this   losartan 25 MG tablet Commonly known as:  COZAAR Take 0.5 tablets (12.5 mg total) by mouth every evening.   mycophenolate 500 MG tablet Commonly known as:  CELLCEPT Take 750 mg by mouth 2 (two) times daily.   pantoprazole 40 MG tablet Commonly known as:  PROTONIX Take 1 tablet (40 mg total) by mouth daily.   predniSONE 5 MG tablet Commonly known as:  DELTASONE Take 17.5 mg by  mouth daily with breakfast.       Disposition   The patient will be discharged in stable condition to home. Discharge Instructions    (HEART FAILURE PATIENTS) Call MD:  Anytime you have any of the following symptoms: 1) 3 pound weight gain in 24 hours or 5 pounds in 1 week 2) shortness of breath, with or without a dry hacking cough 3) swelling in the hands, feet or stomach 4) if you have to sleep on extra pillows at night in order to breathe.    Complete by:  As directed    AMB Referral to [facility] Care Management    Complete by:  As directed    Reason for consult:  Post hospital follow up   Diagnoses of:   Heart Failure COPD/ Pneumonia     Expected date of contact:  1-3 days (reserved for hospital discharges)   Please assign to community nurse for transition of care calls and assess for home visits.  Questions please call:   Charlesetta Shanks, RN BSN CCM [facility]  [phone] business mobile phone Toll free office [phone]   Diet - low sodium heart healthy    Complete by:  As directed    Heart Failure patients record your daily weight using the same scale at the same time of day    Complete by:  As directed    Increase activity slowly    Complete by:  As directed      Follow-up Information    Tonye Becket, NP Follow up on 12/18/2016.   Specialty:  Cardiology Why:  at 1200. Heart Failure Clinic. Garage Code 5001  Contact information: [address] [place] [address] [phone]             Duration of Discharge Encounter: Greater than 35 minutes   Signed, Keon Pender Np-C  12/12/2016, 10:00 AM

## 2016-12-12 NOTE — Progress Notes (Signed)
Call placed to CCMD to notify of telemetry monitoring d/c.    Pt is c/a/ox3, vitals are within patients acceptable/normal limits, though patient has continued to refuse her cardiac meds x at least 48 hours.   Patient reports no complaints, expresses desire to leave the hospital to be home with her son.   Pt advised to call a ride for pick-up, as discharge orders/papers are prepared.

## 2016-12-12 NOTE — Telephone Encounter (Signed)
Called and spoke with pt and she is currently in the hospital so the order has been placed to have the PFT done in the hospital along with the 6 min walk due to our next aval being in April.

## 2016-12-12 NOTE — Progress Notes (Signed)
Page to A.Clegg,NP to inquire about xannax rx pt is requesting...  #[phone]

## 2016-12-12 NOTE — Progress Notes (Signed)
Patient ID: EDOM SCHMUHL, female   DOB: 05-Feb-1981, 37 y.o.   MRN: [id number]    Advanced Heart Failure Rounding Note   Subjective:    Evaluated in the HF clinic 11/27/16. Mild volume overload so low dose lasix added. Also bisoprolol and losartan.   Admitted with increased dyspnea, vomiting, and chest pain. Prior to admit N/V x2 days. Weight down from baseline ~144-146 pounds--->137 pounds. Poor po intake. Diuretics held on admit and given IV fluids.  BNP was not elevated.  She was found to have a UTI and ceftriaxone begun.   Denies SOB/Dizziness. Appetite ok. No dysuria.  Feels great.   ECHO this admission with stable EF 45-50%.    Objective:   Weight Range:  Vital Signs:   Temp:  [97.8 F (36.6 C)-98.6 F (37 C)] 98.6 F (37 C) (02/23 0524) Pulse Rate:  [80-90] 80 (02/23 0524) Resp:  [18] 18 (02/23 0524) BP: (89-96)/(55-61) 96/55 (02/23 0524) SpO2:  [97 %-98 %] 98 % (02/23 0524) Weight:  [141 lb (64 kg)] 141 lb (64 kg) (02/23 0524) Last BM Date: 12/11/16  Weight change: Filed Weights   12/10/16 0521 12/11/16 0400 12/12/16 0524  Weight: 137 lb 9.6 oz (62.4 kg) 140 lb (63.5 kg) 141 lb (64 kg)    Intake/Output:   Intake/Output Summary (Last 24 hours) at 12/12/16 0939 Last data filed at 12/12/16 0835  Gross per 24 hour  Intake              633 ml  Output              701 ml  Net              -68 ml     Physical Exam:  General:  Well appearing. No resp difficulty. In bed  HEENT: normal Neck: supple. JVP flat  . Carotids 2+ bilat; no bruits. No lymphadenopathy or thryomegaly appreciated. Cor: PMI nondisplaced. Regular rate & rhythm. No rubs, gallops or murmurs. Lungs: clear Abdomen: soft, nontender, nondistended. No hepatosplenomegaly. No bruits or masses. Good bowel sounds. Extremities: no cyanosis, clubbing, rash, edema Neuro: alert & orientedx3, cranial nerves grossly intact. moves all 4 extremities w/o difficulty. Affect pleasant  Telemetry: V paced 90s    Labs: Basic Metabolic Panel:  Recent Labs Lab 12/09/16 1510 12/10/16 0545  NA 135 135  K 4.5 3.7  CL 101 102  CO2 22 22  GLUCOSE 160* 133*  BUN 10 9  CREATININE 0.65 0.74  CALCIUM 8.9 8.3*  MG 1.8  --     Liver Function Tests:  Recent Labs Lab 12/10/16 0545  AST 35  ALT 25  ALKPHOS 52  BILITOT 1.2  PROT 6.3*  ALBUMIN 2.3*   No results for input(s): LIPASE, AMYLASE in the last 168 hours. No results for input(s): AMMONIA in the last 168 hours.  CBC:  Recent Labs Lab 12/09/16 1510  WBC 10.6*  NEUTROABS 7.9*  HGB 10.9*  HCT 35.4*  MCV 81.9  PLT 410*    Cardiac Enzymes:  Recent Labs Lab 12/09/16 1510  TROPONINI 0.05*    BNP: BNP (last 3 results)  Recent Labs  12/18/15 1445 02/16/16 1149 12/09/16 1510  BNP 345.4* 56.9 21.9    ProBNP (last 3 results) No results for input(s): PROBNP in the last 8760 hours.    Other results:  Imaging: No results found.   Medications:     Scheduled Medications: . bisoprolol  2.5 mg Oral BH-q7a  . enoxaparin (LOVENOX) injection  40 mg Subcutaneous Q24H  . insulin aspart  0-5 Units Subcutaneous QHS  . insulin aspart  0-9 Units Subcutaneous TID WC  . insulin aspart  8 Units Subcutaneous TID WC  . insulin glargine  35 Units Subcutaneous q morning - 10a  . losartan  12.5 mg Oral QPM  . magnesium oxide  200 mg Oral Daily  . mycophenolate  750 mg Oral BID  . pantoprazole  40 mg Oral Daily  . predniSONE  17.5 mg Oral Q breakfast  . sodium chloride flush  3 mL Intravenous Q12H    Infusions:   PRN Medications: sodium chloride, acetaminophen, ALPRAZolam, ipratropium, nitroGLYCERIN, ondansetron (ZOFRAN) IV, oxyCODONE-acetaminophen, sodium chloride flush, zolpidem   Assessment/Plan/Discussion:   1. Chronic primarily diastolic HF: Echo with stable EF, 45-50%.  SBP in 90s which is her baseline.   - Continue losartan, restart home Lasix after discharge.  Will restart low dose bisoprolol at clinic  followup if BP remains stable.  2. UTI: Transition to po cefuroxime (avoid cipro with long QT), continue for 1 week of treatment.  Will get CBC today pre-discharge.  3. ILD: on 2 liters oxygen chronically 4. Disposition: BP stable, resolved nausea/vomiting.  May go home to complete 1 week treatment with po cefuroxime for UTI.  Followup in HF clinic 2 wks with NP.   Length of Stay: 3   [name]   12/12/2016, 9:39 AM  Advanced Heart Failure Team Pager [phone] (M-F; 7a - 4p)  Please contact [facility] for night-coverage after hours (4p -7a ) and weekends on [email or web address]

## 2016-12-15 ENCOUNTER — Encounter: Payer: Self-pay | Admitting: Internal Medicine

## 2016-12-15 ENCOUNTER — Ambulatory Visit (INDEPENDENT_AMBULATORY_CARE_PROVIDER_SITE_OTHER): Payer: Medicare Other | Admitting: Internal Medicine

## 2016-12-15 ENCOUNTER — Other Ambulatory Visit: Payer: Self-pay

## 2016-12-15 VITALS — BP 102/58 | HR 113 | Temp 98.1°F | Wt 146.6 lb

## 2016-12-15 DIAGNOSIS — R5381 Other malaise: Secondary | ICD-10-CM | POA: Diagnosis present

## 2016-12-15 DIAGNOSIS — I5022 Chronic systolic (congestive) heart failure: Secondary | ICD-10-CM

## 2016-12-15 DIAGNOSIS — N342 Other urethritis: Secondary | ICD-10-CM

## 2016-12-15 NOTE — Progress Notes (Signed)
Subjective:   Patient: Makayla Vasquez       Birthdate: [birth year]       MRN: [id number]      HPI  Makayla Vasquez is a 36 y.o. female presenting for hospital follow-up and to discuss leg pain.   Recent admission Patient was recently admitted to [facility] by the heart failure service from 2/20-2/23. She presented with increased dyspnea, vomiting, and weakness. She was found to have UA positive for UTI, and was started on IV CTX. She continued to improve and was transitioned to PO ceftin prior to discharge. Her HF remained stable throughout admission.   Since discharge, patient reports no complications. She says she still has a cough but is back to her baseline amount of supplemental O2. Prior to admission she was using 2-4L home O2, and today in office she is using 3L. She resumed Lasix yesterday as she was instructed to, and has been holding bisoprolol as instructed as well. She has two more days of ceftin before she will complete the course. Patient has appointment with HF clinic in three days.   Leg pain Patient reports chronic leg pain. She describes the pain as feeling like her leg "is half," or as if "the bones are cracked." This pain is present when laying or sitting. Also reports pain when standing, and says she is unable to stand for greater than five minutes before her legs "give out." Patient cannot specify the nature of the pain. She points to her upper R leg when speaking of the pain, but says that it is "all over" when asked to locate the pain. She was previously taking Percocet for this pain however says she has run out. She has not tried anything else for pain. She is followed by rheumatology.   Smoking status reviewed. Patient is former smoker (quit 1997).   Review of Systems See HPI.     Objective:  Physical Exam  Constitutional:  Chronically ill-appearing female sitting in wheelchair  HENT:  Head: Normocephalic and atraumatic.  Eyes: Conjunctivae and EOM are normal. Right eye  exhibits no discharge. Left eye exhibits no discharge.  Cardiovascular: Normal rate, regular rhythm and normal heart sounds.   No murmur heard. Pulmonary/Chest: Effort normal and breath sounds normal. No respiratory distress. She has no wheezes.  Abdominal: Soft. Bowel sounds are normal. She exhibits no distension.  Musculoskeletal:  Patient in wheelchair so unable to assess gait. Reported mild TTP of upper legs diffusely. No obvious bony abnormalities or asymmetry.   Skin: Skin is warm. No erythema.  Psychiatric: Affect and judgment normal.     Assessment & Plan:  Chronic systolic heart failure (HCC) Doing well since discharge three days ago.  - Continue Lasix, losartan - Hold bisoprolol until cleared by HF clinic - F/u in heart failure clinic on 03/01 with Amy Clegg  UTI (urinary tract infection) Doing well since discharge three days ago. Has two days left of abx.  - Continue ceftin for two additional days to complete course  Physical deconditioning Suspect that patient's pain may be due in part to weakness as a result of physical deconditioning, as pain worse with standing and also reporting that her legs give out after standing for as much as five minutes. 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 Patient is already followed by rheumatology. No signs of infection or fracture on exam today.  - Referral placed for PT - Patient can continue taking Tylenol PRN - Patient not requesting  Percocet today, however could consider refilling if pain worsens    Tarri Abernethy, MD, MPH PGY-2 [facility] Pager [phone]

## 2016-12-15 NOTE — Patient Outreach (Signed)
[facility]) Care Management  12/15/16  Makayla Vasquez 08-Jun-1981 [id number]  Successful outreach completed with patient. Patient unable to talk right now due to being at the doctor's office. However, she is agreeable to outreach tomorrow.  RNCM will attempt to reach patient again for [facility] tomorrow.  Turkey R. Juell Radney, RN, BSN, CCM [facility] Care Management Coordinator [phone]

## 2016-12-15 NOTE — Assessment & Plan Note (Signed)
Doing well since discharge three days ago.  - Continue Lasix, losartan - Hold bisoprolol until cleared by HF clinic - F/u in heart failure clinic on 03/01 with [facility]

## 2016-12-15 NOTE — Patient Instructions (Signed)
It was nice seeing you again today Makayla Vasquez!  I have placed a referral for you to physical therapy. You should be contacted with the date and time of your appointment soon.   Please be sure to go to your appointment at the heart failure clinic on March 1 at noon.   I will see you back in about a month.   If you have any questions or concerns, please feel free to call the clinic.   Be well,  Dr. Natale Milch

## 2016-12-15 NOTE — Assessment & Plan Note (Signed)
Doing well since discharge three days ago. Has two days left of abx.  - Continue ceftin for two additional days to complete course

## 2016-12-15 NOTE — Assessment & Plan Note (Signed)
Suspect that patient's pain may be due in part to weakness as a result of physical deconditioning, as pain worse with standing and also reporting that her legs give out after standing for as much as five minutes. 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 Patient is already followed by rheumatology. No signs of infection or fracture on exam today.  - Referral placed for PT - Patient can continue taking Tylenol PRN - Patient not requesting Percocet today, however could consider refilling if pain worsens

## 2016-12-16 ENCOUNTER — Other Ambulatory Visit: Payer: Self-pay

## 2016-12-16 NOTE — Patient Outreach (Signed)
[facility]) Care Management  12/16/16  Makayla Vasquez [birth year] [id number]  RNCM attempted to reach patient without success. Left HIPAA compliant voicemail with RNCM contact information and invited callback.  Turkey R. Brock Larmon, RN, BSN, CCM [facility] Care Management Coordinator [phone]

## 2016-12-17 ENCOUNTER — Ambulatory Visit: Payer: Self-pay

## 2016-12-18 ENCOUNTER — Ambulatory Visit: Payer: Medicare Other | Admitting: Internal Medicine

## 2016-12-18 ENCOUNTER — Ambulatory Visit (HOSPITAL_COMMUNITY)
Admit: 2016-12-18 | Discharge: 2016-12-18 | Disposition: A | Discharge status: Home / Self Care | Payer: Medicare Other | Attending: Internal Medicine | Admitting: Internal Medicine

## 2016-12-18 ENCOUNTER — Encounter (HOSPITAL_COMMUNITY): Payer: Self-pay

## 2016-12-18 VITALS — BP 104/62 | HR 108 | Wt 151.2 lb

## 2016-12-18 DIAGNOSIS — I110 Hypertensive heart disease with heart failure: Secondary | ICD-10-CM | POA: Diagnosis not present

## 2016-12-18 DIAGNOSIS — I5022 Chronic systolic (congestive) heart failure: Secondary | ICD-10-CM | POA: Insufficient documentation

## 2016-12-18 DIAGNOSIS — F319 Bipolar disorder, unspecified: Secondary | ICD-10-CM | POA: Diagnosis not present

## 2016-12-18 DIAGNOSIS — E669 Obesity, unspecified: Secondary | ICD-10-CM | POA: Insufficient documentation

## 2016-12-18 DIAGNOSIS — J45909 Unspecified asthma, uncomplicated: Secondary | ICD-10-CM | POA: Diagnosis not present

## 2016-12-18 DIAGNOSIS — F419 Anxiety disorder, unspecified: Secondary | ICD-10-CM | POA: Diagnosis not present

## 2016-12-18 DIAGNOSIS — E119 Type 2 diabetes mellitus without complications: Secondary | ICD-10-CM | POA: Insufficient documentation

## 2016-12-18 DIAGNOSIS — K219 Gastro-esophageal reflux disease without esophagitis: Secondary | ICD-10-CM | POA: Insufficient documentation

## 2016-12-18 DIAGNOSIS — F329 Major depressive disorder, single episode, unspecified: Secondary | ICD-10-CM | POA: Insufficient documentation

## 2016-12-18 DIAGNOSIS — I429 Cardiomyopathy, unspecified: Secondary | ICD-10-CM | POA: Diagnosis not present

## 2016-12-18 DIAGNOSIS — R000 Tachycardia, unspecified: Secondary | ICD-10-CM | POA: Diagnosis not present

## 2016-12-18 DIAGNOSIS — Z794 Long term (current) use of insulin: Secondary | ICD-10-CM | POA: Diagnosis not present

## 2016-12-18 DIAGNOSIS — J849 Interstitial pulmonary disease, unspecified: Secondary | ICD-10-CM | POA: Diagnosis not present

## 2016-12-18 DIAGNOSIS — I5032 Chronic diastolic (congestive) heart failure: Secondary | ICD-10-CM

## 2016-12-18 DIAGNOSIS — Z950 Presence of cardiac pacemaker: Secondary | ICD-10-CM | POA: Diagnosis not present

## 2016-12-18 DIAGNOSIS — Z87891 Personal history of nicotine dependence: Secondary | ICD-10-CM | POA: Diagnosis not present

## 2016-12-18 DIAGNOSIS — N390 Urinary tract infection, site not specified: Secondary | ICD-10-CM | POA: Insufficient documentation

## 2016-12-18 DIAGNOSIS — M069 Rheumatoid arthritis, unspecified: Secondary | ICD-10-CM | POA: Insufficient documentation

## 2016-12-18 DIAGNOSIS — M329 Systemic lupus erythematosus, unspecified: Secondary | ICD-10-CM | POA: Diagnosis not present

## 2016-12-18 MED ORDER — BISOPROLOL FUMARATE 5 MG PO TABS: 2.5000 mg | ORAL_TABLET | Freq: Every day | ORAL | 6 refills | Status: DC

## 2016-12-18 NOTE — Patient Instructions (Signed)
START Bisoprolol 2.5 mg, one half tab daily at bedtime   Your physician recommends that you schedule a follow-up appointment in: 3 months with Amy Clegg, NP-C    Do the following things EVERYDAY: 1) Weigh yourself in the morning before breakfast. Write it down and keep it in a log. 2) Take your medicines as prescribed 3) Eat low salt foods-Limit salt (sodium) to 2000 mg per day.  4) Stay as active as you can everyday 5) Limit all fluids for the day to less than 2 liters

## 2016-12-18 NOTE — Progress Notes (Signed)
Patient ID: Makayla Vasquez, female   DOB: 11-Dec-1980, 36 y.o.   MRN: [id number] PCP: Dr Natale Milch Cardiology: Dr Shirlee Latch Pulmonary: Dr Ang [facility] and Dr Maple Hudson.   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   She has a h/o congenital HB and underwent first PPM at age 36. In 2011 had ECHO with EF 35-40%. LV dysfunction felt to be due to RV pacing so upgraded to CRT-P. Echo in 10/15 with EF 25%.  She also has concomitant lung disease and this has limited b-blocker dosing.  She follows with Dr. Maple Hudson for her lung disease.Admitted in 37/15 and 9/15 with worsening respiratory status and severe hypoxia. Had extensive w/u. Thought to have a viral pneumonitis complicated by atelectasis and mild edema vs. an autoimmune process. Discharged home on a course of levaquin and prednisone.   She was offered a lung biopsy for further diagnosis versus empiric treatment with steroids. She opted for empiric steroid treatment. She is followed by a rheumatologist and is on Cellcept and prednisone taper. 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  She is on oxygen and night and with activity.   She returns for post hospital follow up. Admitted with hypotension and UTI. Completing antibiotics. Overall feeling ok. Ongoing SOB with exertion but this is her baseline.  Denies PND/Orthopnea. No fever or chills. Taking all medications.   PFTs (12/15) showed a severe restrictive defect concerning for an interstitial process.   EP study 10/15 for persistent tachycardia and found to have sinus tachycardia.   06/16/14 CT scan showed bilateral progressive lower peribronchial infiltrates with groundglass changes and nodular patter consistent with acute pneumonitis of unclear etiology.  06/20/14 BAL showed 60% macrophages and 30% PMNs. Her AFB smear, fungal smear, legionella Ab,  pneumocystis smear, ACE, and sputum culture are negative. Her AFB, legionella, fungal, and BAL culture as well as her fungal Ab, hypersensitivity pneumonitis panel were all negative Serology: DsDNA, RF, ANCA, HSP all negative  ECHO  1/17 showed EF 40-45%, mild LVH, diffuse hypokinesis.  8/17 with EF improved to 50-55%, PASP 48 mmHg.   RHC 10/26/2014  RA = 2 RV = 27/1/5 PA = 29/7 (18) PCW = 4 Fick cardiac output/index = 4.5/2.2 PVR = 3.0 WU FA sat = 98% PA sat = 64%, 61%  Labs (10/15): TSH normal K 3.6, creatinine 1.1 Labs (10/31/2014) L k 3.9 Creatinine 1.06  Labs 07/23/2015: K 3.8 Creatinine 0.82  Labs (3/17): K 4, creatinine 0.6 Labs (5/17): K 4.1, creatinine 0.7 Labs (1/18): K 3.6, creatinine 0.42  Review of Systems: All systems reviewed and negative except as per HPI.   Past Medical History:  Diagnosis Date  . Anxiety   . Arthritis    rheumatoid arthritis- mild, no rheumatology care   . Asthma    seasonal allergies   . Asymptomatic LV dysfunction    a. Echo in Dec 2011 with EF 35 to 40%. Felt to be due to paced rhythm. b. EF 25-30% in 07/2014.  . Bipolar affective disorder (HCC)   . Cardiac pacemaker    a. Since age 36 in 31. b. Upgrade to BiV in 36.  [name] Carpal tunnel syndrome of right wrist   . CHF (congestive heart failure) (HCC)   . Congenital complete AV block   . Depression    bipolar  . Diabetes mellitus without complication (HCC)   .  GERD (gastroesophageal reflux disease)   . Hypertension   . Lupus   . Lupus (systemic lupus erythematosus) (HCC)   . Obesity   . Pneumonitis    a. a/w hypoxia - inflammatory - large workup 07/2014.  . Presence of permanent cardiac pacemaker   . Seizures (HCC)    as a child- from high fever  . Sinus tachycardia     Current Outpatient Prescriptions  Medication Sig Dispense Refill  . cefUROXime (CEFTIN) 500 MG tablet Take 1 tablet (500 mg total) by mouth 2 (two) times daily with a meal. 8 tablet 0  . fluocinonide  (LIDEX) 0.05 % external solution 1 APPLICATION APPLY ON THE SKIN AS DIRECTED APPLY TO AFFECTED AREAS DAILY ON SCALP AS NEEDED FOR ITCHING.  5  . furosemide (LASIX) 20 MG tablet Take 1 tablet (20 mg total) by mouth every other day. 15 tablet 3  . insulin aspart (NOVOLOG) 100 UNIT/ML FlexPen Inject 8 Units into the skin 3 (three) times daily with meals. 15 mL 11  . Insulin Glargine (LANTUS SOLOSTAR) 100 UNIT/ML Solostar Pen Inject 35 Units into the skin daily. (Patient taking differently: Inject 35 Units into the skin every morning. ) 15 mL 5  . ipratropium (ATROVENT) 0.06 % nasal spray Place 2 sprays into both nostrils 4 (four) times daily. (Patient taking differently: Place 2 sprays into both nostrils daily as needed for rhinitis. ) 15 mL 1  . losartan (COZAAR) 25 MG tablet Take 0.5 tablets (12.5 mg total) by mouth every evening. 15 tablet 3  . mycophenolate (CELLCEPT) 500 MG tablet Take 750 mg by mouth 2 (two) times daily.    . pantoprazole (PROTONIX) 40 MG tablet Take 1 tablet (40 mg total) by mouth daily. 30 tablet 5  . predniSONE (DELTASONE) 5 MG tablet Take 17.5 mg by mouth daily with breakfast.      No current facility-administered medications for this encounter.     Allergies  Allergen Reactions  . 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 Sertraline Hcl Hives  . Tape Other (See Comments)    Burns skin      Social History   Social History  . Marital status: Single    Spouse name: N/A  . Number of children: 1  . Years of education: N/A   Occupational History  . CNA    Social History Main Topics  . Smoking status: Former Smoker    Packs/day: 0.25    Years: 0.50    Types: Cigarettes    Quit date: 07/25/1996  . Smokeless tobacco: Never Used  . Alcohol use No  . Drug use: No  . Sexual activity: Yes    Birth control/ protection: Implant, IUD     Comment: placed 04/2011   Other Topics Concern  . Not on  file   Social History Narrative   Works at [employer].        Family History  Problem Relation Age of Onset  . Heart disease Mother     CHF (no details)  . Hypertension Mother   . Lupus Mother   . Heart disease Father     Murmur  . Heart disease Sister 40     No details.  History of a pacemaker    Vitals:   12/18/16 1235 12/18/16 1238  BP: 104/62   Pulse: (!) 108   SpO2: 96% 96%  Weight: 151 lb 3.2 oz (68.6 kg)  PHYSICAL EXAM: General:  NAD. Arrived in a wheel chair HEENT: normal Neck: supple. JVP 5-6   Carotids 2+ bilat; no bruits. No lymphadenopathy or thryomegaly appreciated. Cor: PMI nonpalpable. Tachy Regular rate/rhythm. No rubs, gallops or murmurs. Lungs: CTAB.  Abdomen: Soft, NT, ND. No hepatosplenomegaly. No bruits or masses. Good bowel sounds. Extremities: no cyanosis, clubbing, rash, no edema Neuro: alert & oriented x 3, cranial nerves grossly intact. moves all 4 extremities w/o difficulty. Affect pleasant.  EKG -Vpaced 127 BPM    ASSESSMENT & PLAN: 1) Chronic systolic HF:  Echo 05/2014 EF 20-25%, echo 1/17 improved with EF 40-45% and echo in 8/17 with EF up to 50-55%. Nonischemic cardiomyopathy.  Medtronic CRT-P device.  - Continue Lasix 20 mg every other day.   - Restart  bisoprolol 2.5 qam  - Continue losartan 12.5 qpm.   2) Anti-synthetase syndrome with interstitial lung disease: Suspect this also causes the persistent weakness.  She is followed by rheumatology and is on Cellcept and prednisone.  Also [facility]. Pulmonlogy  3) Congenital AVN block s/p pacer: Has EP followup.  4) UTI- recent hospitalization- Completing antibiotic course.   Follow up 4 months   Amy Clegg NP_C  12/18/2016

## 2016-12-19 ENCOUNTER — Encounter (HOSPITAL_COMMUNITY): Payer: Medicare Other

## 2016-12-23 ENCOUNTER — Ambulatory Visit (HOSPITAL_COMMUNITY)
Admission: RE | Admit: 2016-12-23 | Discharge: 2016-12-23 | Disposition: A | Discharge status: Home / Self Care | Payer: Medicare Other | Source: Ambulatory Visit | Attending: Internal Medicine | Admitting: Internal Medicine

## 2016-12-23 DIAGNOSIS — J9611 Chronic respiratory failure with hypoxia: Secondary | ICD-10-CM | POA: Diagnosis not present

## 2016-12-23 LAB — PULMONARY FUNCTION TEST
DL/VA % pred: 50 %
DL/VA: 2.49 ml/min/mmHg/L
DLCO UNC % PRED: 21 %
DLCO UNC: 5.48 ml/min/mmHg
FEF 25-75 PRE: 3.16 L/s
FEF 25-75 Post: 2.69 L/sec
FEF2575-%CHANGE-POST: -14 %
FEF2575-%PRED-PRE: 104 %
FEF2575-%Pred-Post: 88 %
FEV1-%Change-Post: -4 %
FEV1-%PRED-POST: 58 %
FEV1-%Pred-Pre: 61 %
FEV1-POST: 1.58 L
FEV1-Pre: 1.66 L
FEV1FVC-%CHANGE-POST: 8 %
FEV1FVC-%Pred-Pre: 108 %
FEV6-%CHANGE-POST: -7 %
FEV6-%PRED-POST: 50 %
FEV6-%PRED-PRE: 54 %
FEV6-PRE: 1.72 L
FEV6-Post: 1.6 L
FEV6FVC-%Change-Post: 0 %
FEV6FVC-%PRED-PRE: 101 %
FEV6FVC-%Pred-Post: 102 %
FVC-%Change-Post: -12 %
FVC-%Pred-Post: 49 %
FVC-%Pred-Pre: 56 %
FVC-Post: 1.6 L
FVC-Pre: 1.82 L
POST FEV1/FVC RATIO: 99 %
POST FEV6/FVC RATIO: 100 %
Pre FEV1/FVC ratio: 91 %
Pre FEV6/FVC Ratio: 100 %
RV % PRED: 54 %
RV: 0.86 L
TLC % pred: 49 %
TLC: 2.57 L

## 2016-12-23 MED ORDER — ALBUTEROL SULFATE (2.5 MG/3ML) 0.083% IN NEBU
2.5000 mg | INHALATION_SOLUTION | Freq: Once | RESPIRATORY_TRACT | Status: AC
  Administered 2016-12-23: 2.5 mg via RESPIRATORY_TRACT

## 2016-12-24 ENCOUNTER — Ambulatory Visit: Payer: Medicare Other | Admitting: Physical Therapy

## 2016-12-29 ENCOUNTER — Ambulatory Visit: Payer: Medicare Other | Admitting: Physical Therapy

## 2017-01-02 DIAGNOSIS — Z794 Long term (current) use of insulin: Secondary | ICD-10-CM | POA: Diagnosis not present

## 2017-01-02 DIAGNOSIS — M79605 Pain in left leg: Secondary | ICD-10-CM | POA: Diagnosis not present

## 2017-01-02 DIAGNOSIS — J849 Interstitial pulmonary disease, unspecified: Secondary | ICD-10-CM | POA: Diagnosis not present

## 2017-01-02 DIAGNOSIS — R748 Abnormal levels of other serum enzymes: Secondary | ICD-10-CM | POA: Diagnosis not present

## 2017-01-02 DIAGNOSIS — Z79899 Other long term (current) drug therapy: Secondary | ICD-10-CM | POA: Diagnosis not present

## 2017-01-02 DIAGNOSIS — M79604 Pain in right leg: Secondary | ICD-10-CM | POA: Diagnosis not present

## 2017-01-02 DIAGNOSIS — D8989 Other specified disorders involving the immune mechanism, not elsewhere classified: Secondary | ICD-10-CM | POA: Diagnosis not present

## 2017-01-02 DIAGNOSIS — Z87891 Personal history of nicotine dependence: Secondary | ICD-10-CM | POA: Diagnosis not present

## 2017-01-02 DIAGNOSIS — R531 Weakness: Secondary | ICD-10-CM | POA: Diagnosis not present

## 2017-01-02 DIAGNOSIS — M609 Myositis, unspecified: Secondary | ICD-10-CM | POA: Diagnosis not present

## 2017-01-03 IMAGING — CR DG CHEST 2V
2 series · 2 of 2 positions shown · non-contrast
Comparison: 10/24/2014

CLINICAL DATA: Routine. No complaints. Hx of HTN, diabetic, CHF,
asthma, pneumonitis, pacemaker insertion.

EXAM:
CHEST  2 VIEW

[view not recorded (1 of 2)]
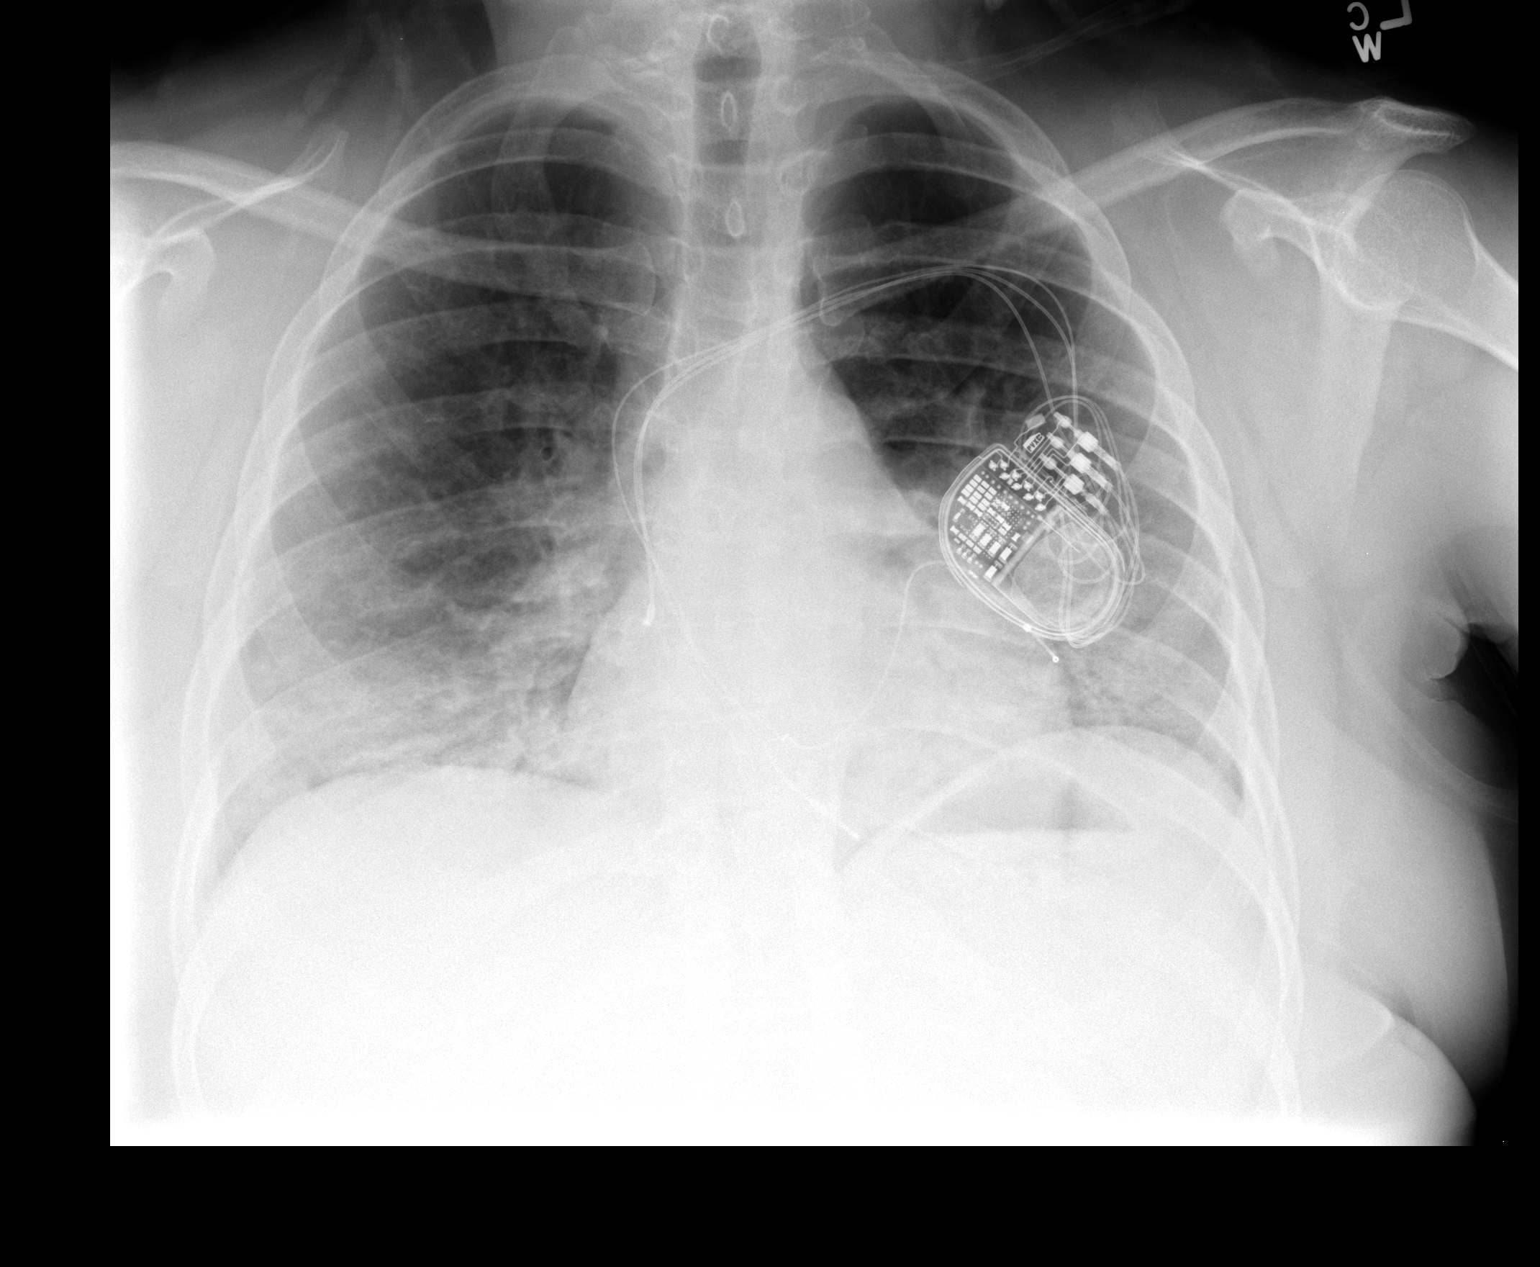

[view not recorded (2 of 2)]
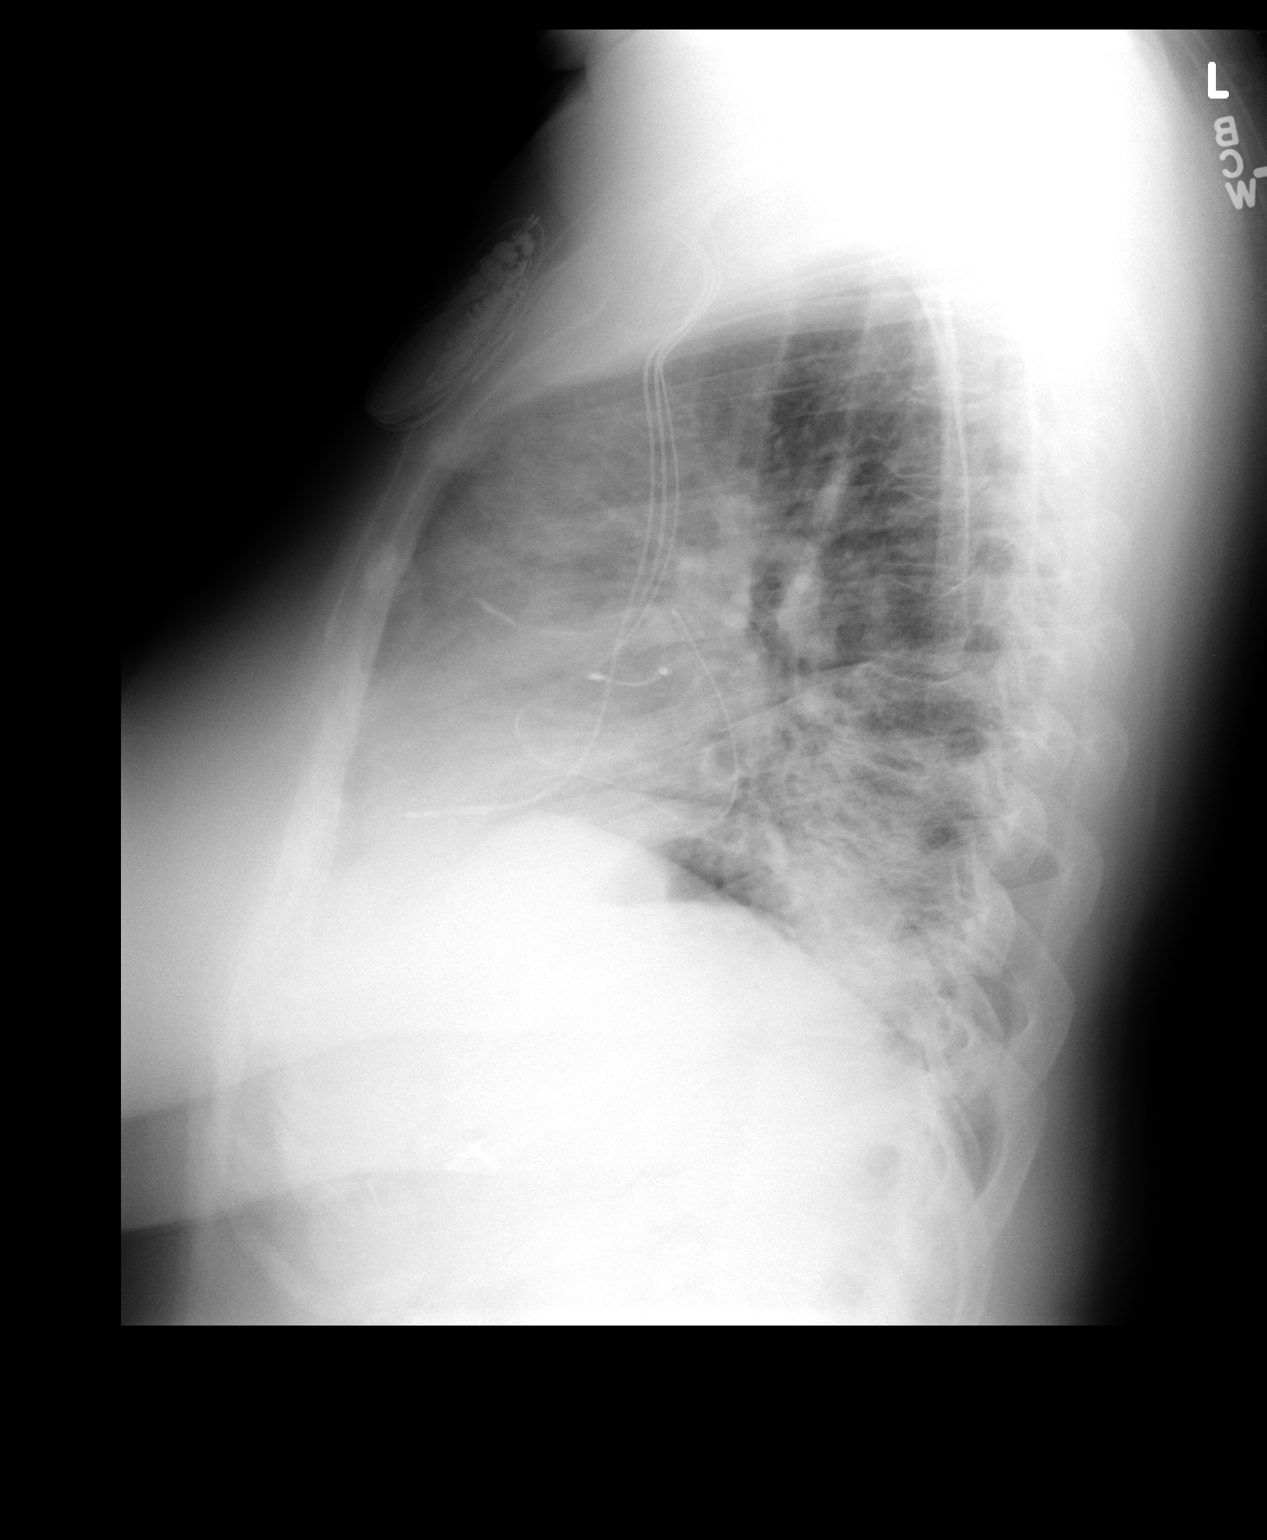

[2 of 2 positions shown; findings below may reference images not displayed]

FINDINGS: Cardiac silhouette normal in size and configuration. Biventricular
pacemaker is well positioned. No mediastinal or hilar masses or
evidence of adenopathy.

There is opacity in both lower lobes best seen on the lateral view,
stable from prior exams. Given the lack of symptoms and the lack of
change prior studies, this is all presumed to be chronic. No focal
lung consolidation is seen to suggest pneumonia. No pulmonary edema.
No pleural effusion or pneumothorax.

Bony thorax is unremarkable.
IMPRESSION: 1. Persistent lung base opacity similar to prior studies. The
etiology of this is unclear, but given the lack of symptoms, it is
likely chronic.
2. No convincing acute cardiopulmonary disease.

## 2017-01-04 ENCOUNTER — Inpatient Hospital Stay (HOSPITAL_COMMUNITY)
Admission: EM | Admit: 2017-01-04 | Discharge: 2017-01-07 | DRG: 872 | Disposition: A | Discharge status: Home / Self Care | Payer: Medicare Other | Attending: Internal Medicine | Admitting: Internal Medicine

## 2017-01-04 ENCOUNTER — Emergency Department (HOSPITAL_COMMUNITY): Payer: Medicare Other

## 2017-01-04 ENCOUNTER — Encounter (HOSPITAL_COMMUNITY): Payer: Self-pay | Admitting: Emergency Medicine

## 2017-01-04 ENCOUNTER — Telehealth: Payer: Self-pay | Admitting: Internal Medicine

## 2017-01-04 DIAGNOSIS — M329 Systemic lupus erythematosus, unspecified: Secondary | ICD-10-CM | POA: Diagnosis present

## 2017-01-04 DIAGNOSIS — Z8249 Family history of ischemic heart disease and other diseases of the circulatory system: Secondary | ICD-10-CM

## 2017-01-04 DIAGNOSIS — N390 Urinary tract infection, site not specified: Secondary | ICD-10-CM | POA: Diagnosis not present

## 2017-01-04 DIAGNOSIS — Q246 Congenital heart block: Secondary | ICD-10-CM | POA: Diagnosis not present

## 2017-01-04 DIAGNOSIS — Z9981 Dependence on supplemental oxygen: Secondary | ICD-10-CM

## 2017-01-04 DIAGNOSIS — R109 Unspecified abdominal pain: Secondary | ICD-10-CM | POA: Diagnosis not present

## 2017-01-04 DIAGNOSIS — N3000 Acute cystitis without hematuria: Secondary | ICD-10-CM

## 2017-01-04 DIAGNOSIS — I110 Hypertensive heart disease with heart failure: Secondary | ICD-10-CM | POA: Diagnosis present

## 2017-01-04 DIAGNOSIS — I5022 Chronic systolic (congestive) heart failure: Secondary | ICD-10-CM | POA: Diagnosis present

## 2017-01-04 DIAGNOSIS — N12 Tubulo-interstitial nephritis, not specified as acute or chronic: Secondary | ICD-10-CM | POA: Diagnosis present

## 2017-01-04 DIAGNOSIS — J45909 Unspecified asthma, uncomplicated: Secondary | ICD-10-CM | POA: Diagnosis present

## 2017-01-04 DIAGNOSIS — Z9049 Acquired absence of other specified parts of digestive tract: Secondary | ICD-10-CM

## 2017-01-04 DIAGNOSIS — R0902 Hypoxemia: Secondary | ICD-10-CM | POA: Diagnosis not present

## 2017-01-04 DIAGNOSIS — E119 Type 2 diabetes mellitus without complications: Secondary | ICD-10-CM | POA: Diagnosis present

## 2017-01-04 DIAGNOSIS — A419 Sepsis, unspecified organism: Secondary | ICD-10-CM | POA: Diagnosis not present

## 2017-01-04 DIAGNOSIS — R1110 Vomiting, unspecified: Secondary | ICD-10-CM | POA: Diagnosis present

## 2017-01-04 DIAGNOSIS — A4181 Sepsis due to Enterococcus: Secondary | ICD-10-CM

## 2017-01-04 DIAGNOSIS — K219 Gastro-esophageal reflux disease without esophagitis: Secondary | ICD-10-CM | POA: Diagnosis present

## 2017-01-04 DIAGNOSIS — I5023 Acute on chronic systolic (congestive) heart failure: Secondary | ICD-10-CM | POA: Diagnosis present

## 2017-01-04 DIAGNOSIS — Z950 Presence of cardiac pacemaker: Secondary | ICD-10-CM

## 2017-01-04 DIAGNOSIS — E2740 Unspecified adrenocortical insufficiency: Secondary | ICD-10-CM | POA: Diagnosis present

## 2017-01-04 DIAGNOSIS — D8989 Other specified disorders involving the immune mechanism, not elsewhere classified: Secondary | ICD-10-CM | POA: Diagnosis not present

## 2017-01-04 DIAGNOSIS — Z87891 Personal history of nicotine dependence: Secondary | ICD-10-CM

## 2017-01-04 DIAGNOSIS — I420 Dilated cardiomyopathy: Secondary | ICD-10-CM | POA: Diagnosis present

## 2017-01-04 DIAGNOSIS — M069 Rheumatoid arthritis, unspecified: Secondary | ICD-10-CM | POA: Diagnosis present

## 2017-01-04 DIAGNOSIS — E669 Obesity, unspecified: Secondary | ICD-10-CM | POA: Diagnosis not present

## 2017-01-04 DIAGNOSIS — R05 Cough: Secondary | ICD-10-CM | POA: Diagnosis not present

## 2017-01-04 DIAGNOSIS — E1169 Type 2 diabetes mellitus with other specified complication: Secondary | ICD-10-CM | POA: Diagnosis not present

## 2017-01-04 DIAGNOSIS — R112 Nausea with vomiting, unspecified: Secondary | ICD-10-CM | POA: Diagnosis present

## 2017-01-04 DIAGNOSIS — D638 Anemia in other chronic diseases classified elsewhere: Secondary | ICD-10-CM | POA: Diagnosis not present

## 2017-01-04 DIAGNOSIS — R918 Other nonspecific abnormal finding of lung field: Secondary | ICD-10-CM | POA: Diagnosis present

## 2017-01-04 DIAGNOSIS — J9611 Chronic respiratory failure with hypoxia: Secondary | ICD-10-CM | POA: Diagnosis present

## 2017-01-04 DIAGNOSIS — J9610 Chronic respiratory failure, unspecified whether with hypoxia or hypercapnia: Secondary | ICD-10-CM

## 2017-01-04 DIAGNOSIS — E860 Dehydration: Secondary | ICD-10-CM | POA: Diagnosis present

## 2017-01-04 DIAGNOSIS — D649 Anemia, unspecified: Secondary | ICD-10-CM | POA: Diagnosis present

## 2017-01-04 DIAGNOSIS — R6510 Systemic inflammatory response syndrome (SIRS) of non-infectious origin without acute organ dysfunction: Secondary | ICD-10-CM | POA: Diagnosis present

## 2017-01-04 DIAGNOSIS — G43A1 Cyclical vomiting, intractable: Secondary | ICD-10-CM | POA: Diagnosis not present

## 2017-01-04 DIAGNOSIS — A4159 Other Gram-negative sepsis: Secondary | ICD-10-CM | POA: Diagnosis not present

## 2017-01-04 DIAGNOSIS — K2970 Gastritis, unspecified, without bleeding: Secondary | ICD-10-CM | POA: Diagnosis not present

## 2017-01-04 DIAGNOSIS — R0602 Shortness of breath: Secondary | ICD-10-CM | POA: Diagnosis not present

## 2017-01-04 LAB — I-STAT BETA HCG BLOOD, ED (MC, WL, AP ONLY): I-stat hCG, quantitative: <5 m[IU]/mL (ref <5)

## 2017-01-04 LAB — COMPREHENSIVE METABOLIC PANEL
ALBUMIN: 2.6 g/dL — AB (ref 3.5–5.0)
ALT: 18 U/L (ref 14–54)
AST: 39 U/L (ref 15–41)
Alkaline Phosphatase: 50 U/L (ref 38–126)
Anion gap: 8 (ref 5–15)
BILIRUBIN TOTAL: 0.9 mg/dL (ref 0.3–1.2)
BUN: 8 mg/dL (ref 6–20)
CO2: 22 mmol/L (ref 22–32)
Calcium: 8.1 mg/dL — ABNORMAL LOW (ref 8.9–10.3)
Chloride: 105 mmol/L (ref 101–111)
Creatinine, Ser: 0.55 mg/dL (ref 0.44–1.00)
GFR calc Af Amer: >60 mL/min (ref >60)
GFR calc non Af Amer: >60 mL/min (ref >60)
GLUCOSE: 205 mg/dL — AB (ref 65–99)
POTASSIUM: 4.3 mmol/L (ref 3.5–5.1)
SODIUM: 135 mmol/L (ref 135–145)
Total Protein: 6.8 g/dL (ref 6.5–8.1)

## 2017-01-04 LAB — CBC WITH DIFFERENTIAL/PLATELET
BASOS ABS: 0 10*3/uL (ref 0.0–0.1)
BASOS PCT: 0 %
EOS ABS: 0 10*3/uL (ref 0.0–0.7)
Eosinophils Relative: 0 %
HEMATOCRIT: 31.1 % — AB (ref 36.0–46.0)
Hemoglobin: 9.7 g/dL — ABNORMAL LOW (ref 12.0–15.0)
Lymphocytes Relative: 10 %
Lymphs Abs: 0.9 10*3/uL (ref 0.7–4.0)
MCH: 24.4 pg — ABNORMAL LOW (ref 26.0–34.0)
MCHC: 31.2 g/dL (ref 30.0–36.0)
MCV: 78.3 fL (ref 78.0–100.0)
MONO ABS: 0.2 10*3/uL (ref 0.1–1.0)
Monocytes Relative: 2 %
NEUTROS ABS: 8 10*3/uL — AB (ref 1.7–7.7)
Neutrophils Relative %: 88 %
Platelets: 374 10*3/uL (ref 150–400)
RBC: 3.97 MIL/uL (ref 3.87–5.11)
RDW: 17.8 % — AB (ref 11.5–15.5)
WBC: 9.1 10*3/uL (ref 4.0–10.5)

## 2017-01-04 LAB — URINALYSIS, ROUTINE W REFLEX MICROSCOPIC
Bilirubin Urine: NEGATIVE
Glucose, UA: 50 mg/dL — AB
Ketones, ur: 20 mg/dL — AB
NITRITE: NEGATIVE
PROTEIN: NEGATIVE mg/dL
Specific Gravity, Urine: 1.018 (ref 1.005–1.030)
pH: 5 (ref 5.0–8.0)

## 2017-01-04 LAB — CK: CK TOTAL: 3548 U/L — AB (ref 38–234)

## 2017-01-04 LAB — LIPASE, BLOOD: LIPASE: 12 U/L (ref 11–51)

## 2017-01-04 LAB — I-STAT TROPONIN, ED: Troponin i, poc: 0.01 ng/mL (ref 0.00–0.08)

## 2017-01-04 LAB — I-STAT CG4 LACTIC ACID, ED
Lactic Acid, Venous: 0.97 mmol/L (ref 0.5–1.9)
Lactic Acid, Venous: 0.84 mmol/L (ref 0.5–1.9)

## 2017-01-04 MED ORDER — DEXTROSE 5 % IV SOLN
1.0000 g | Freq: Three times a day (TID) | INTRAVENOUS | Status: DC
  Administered 2017-01-05 – 2017-01-06 (×4): 1 g via INTRAVENOUS
  Filled 2017-01-04 (×6): qty 1

## 2017-01-04 MED ORDER — METHYLPREDNISOLONE SODIUM SUCC 40 MG IJ SOLR
40.0000 mg | Freq: Every day | INTRAMUSCULAR | Status: DC
  Administered 2017-01-04 – 2017-01-07 (×4): 40 mg via INTRAVENOUS
  Filled 2017-01-04 (×4): qty 1

## 2017-01-04 MED ORDER — SODIUM CHLORIDE 0.9 % IV BOLUS (SEPSIS)
1000.0000 mL | Freq: Once | INTRAVENOUS | Status: AC
  Administered 2017-01-04: 1000 mL via INTRAVENOUS

## 2017-01-04 MED ORDER — DEXTROSE 5 % IV SOLN
1.0000 g | INTRAVENOUS | Status: AC
  Administered 2017-01-04: 1 g via INTRAVENOUS
  Filled 2017-01-04: qty 1

## 2017-01-04 MED ORDER — LACTATED RINGERS IV SOLN: INTRAVENOUS | Status: DC

## 2017-01-04 MED ORDER — INSULIN GLARGINE 100 UNIT/ML ~~LOC~~ SOLN
35.0000 [IU] | Freq: Every morning | SUBCUTANEOUS | Status: DC
  Filled 2017-01-04: qty 0.35

## 2017-01-04 MED ORDER — IOPAMIDOL (ISOVUE-300) INJECTION 61%
INTRAVENOUS | Status: AC
  Administered 2017-01-04: 100 mL via INTRAVENOUS
  Filled 2017-01-04: qty 100

## 2017-01-04 MED ORDER — ACETAMINOPHEN 650 MG RE SUPP: 650.0000 mg | Freq: Four times a day (QID) | RECTAL | Status: DC | PRN

## 2017-01-04 MED ORDER — ACETAMINOPHEN 325 MG PO TABS
650.0000 mg | ORAL_TABLET | Freq: Four times a day (QID) | ORAL | Status: DC | PRN
  Administered 2017-01-05: 650 mg via ORAL
  Filled 2017-01-04: qty 2

## 2017-01-04 MED ORDER — MYCOPHENOLATE MOFETIL 250 MG PO CAPS
1000.0000 mg | ORAL_CAPSULE | Freq: Two times a day (BID) | ORAL | Status: DC
  Administered 2017-01-04 – 2017-01-07 (×6): 1000 mg via ORAL
  Filled 2017-01-04 (×6): qty 4

## 2017-01-04 MED ORDER — MORPHINE SULFATE (PF) 4 MG/ML IV SOLN
4.0000 mg | Freq: Once | INTRAVENOUS | Status: AC
Start: 2017-01-04 — End: 2017-01-04
  Administered 2017-01-04: 4 mg via INTRAVENOUS
  Filled 2017-01-04: qty 1

## 2017-01-04 MED ORDER — PANTOPRAZOLE SODIUM 40 MG PO TBEC
40.0000 mg | DELAYED_RELEASE_TABLET | Freq: Every day | ORAL | Status: DC
Start: 2017-01-05 — End: 2017-01-07
  Administered 2017-01-05 – 2017-01-07 (×3): 40 mg via ORAL
  Filled 2017-01-04 (×3): qty 1

## 2017-01-04 MED ORDER — ENOXAPARIN SODIUM 40 MG/0.4ML ~~LOC~~ SOLN
40.0000 mg | Freq: Every day | SUBCUTANEOUS | Status: DC
  Administered 2017-01-04 – 2017-01-06 (×3): 40 mg via SUBCUTANEOUS
  Filled 2017-01-04 (×3): qty 0.4

## 2017-01-04 NOTE — ED Notes (Signed)
Bed: WA21 Expected date:  Expected time:  Means of arrival:  Comments: Dehydration, hypotensive

## 2017-01-04 NOTE — ED Provider Notes (Addendum)
[facility]-EMERGENCY DEPT Provider Note   CSN: [id number] Arrival date & time: 01/04/17  1634     History   Chief Complaint Chief Complaint  Patient presents with  . Dehydration  . Hypotension  . Emesis    HPI Makayla Vasquez is a 36 y.o. female.  HPI  35-yr-old F with extensive PMHx as below here with n/v/d. Pt reports that she ate at [employer] four days ago. Approx 1-2 hours later, she developed profuse watery vomiting and nausea with epigastric abd pain. She saw her PCP 2 days ago and was told to drink fluids at home. Since then, she has had profuse, persistent n/v and has been unable to eat or drink. She has not had any diarrhea. Her pain is a cramping, aching epigastric pain worse with eating and palpation. No alleviating factors. Has not taken many meds at home as she has vomited them up.  Past Medical History:  Diagnosis Date  . Anxiety   . Arthritis    rheumatoid arthritis- mild, no rheumatology care   . Asthma    seasonal allergies   . Asymptomatic LV dysfunction    a. Echo in Dec 2011 with EF 35 to 40%. Felt to be due to paced rhythm. b. EF 25-30% in 07/2014.  . Bipolar affective disorder (HCC)   . Cardiac pacemaker    a. Since age 78 in 75. b. Upgrade to BiV in 2013.  [name] Carpal tunnel syndrome of right wrist   . CHF (congestive heart failure) (HCC)   . Congenital complete AV block   . Depression    bipolar  . Diabetes mellitus without complication (HCC)   . GERD (gastroesophageal reflux disease)   . Hypertension   . Lupus   . Lupus (systemic lupus erythematosus) (HCC)   . Obesity   . Pneumonitis    a. a/w hypoxia - inflammatory - large workup 07/2014.  . Presence of permanent cardiac pacemaker   . Seizures (HCC)    as a child- from high fever  . Sinus tachycardia     Patient Active Problem List   Diagnosis Date Noted  . Nausea & vomiting 01/04/2017  . UTI (urinary tract infection) 12/09/2016  . 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 Community acquired  pneumonia   . Physical deconditioning   . Lactic acidosis   . Leukocytosis   . On Cellcept therapy   . On prednisone therapy   . Systemic lupus erythematosus (HCC)   . Fever in adult   . Community acquired bacterial pneumonia 11/01/2016  . Right knee pain 10/16/2016  . Long term (current) use of systemic steroids   . History of heart failure   . Hyperglycemia 09/23/2016  . Breast pain, right 04/14/2016  . Chronic diastolic congestive heart failure (HCC)   . Bacteremia 02/24/2016  . SIRS (systemic inflammatory response syndrome) (HCC) 02/22/2016  . Sepsis (HCC) 02/16/2016  . Myalgia and myositis   . Type 2 diabetes mellitus with hyperglycemia, with long-term current use of insulin (HCC)   . Anemia of chronic disease   . Bipolar affective disorder in remission (HCC)   . Generalized weakness   . Acute blood loss anemia   . Cardiac pacemaker in situ   . Adrenal insufficiency (HCC)   . Acute on chronic diastolic CHF (congestive heart failure) (HCC)   . Antisynthetase syndrome (HCC)   . Idiopathic hypotension 07/08/2015  . Hyperlipidemia associated with type 2 diabetes mellitus (HCC) 05/04/2015  . Screening for cervical cancer 01/19/2015  .  Onychomycosis 12/14/2014  . CHF (congestive heart failure) (HCC)   . Diabetes mellitus type 2 in obese (HCC)   . Chronic respiratory failure with hypoxia (HCC) 08/07/2014  . Insertion of implantable subdermal contraceptive 07/12/2014  . ILD (interstitial lung disease) (HCC) 06/19/2014  . Congestive dilated cardiomyopathy (HCC) 05/31/2014  . Vaginal itching 05/22/2014  . Constipated 03/03/2012  . Chronic systolic heart failure (HCC) 02/26/2012  . Intermittent abdominal pain 02/18/2012  . PPM-[employer]tr[employer]   . Congenital complete AV block   . GERD (gastroesophageal reflux disease)   . THYROGLOSSAL DUCT CYST 07/18/2010  . RH FACTOR, NEGATIVE 04/08/2010  . Mild intermittent asthma without complication 01/11/2009  . POLYCYSTIC OVARY 12/17/2006     Past Surgical History:  Procedure Laterality Date  . ATRIAL TACH ABLATION N/A 08/14/2014   Procedure: ATRIAL TACH ABLATION;  Surgeon: Marinus Maw, MD;  Location: [facility] CATH LAB;  Service: Cardiovascular;  Laterality: N/A;  . BI-VENTRICULAR PACEMAKER UPGRADE N/A 03/08/2012   Procedure: BI-VENTRICULAR PACEMAKER UPGRADE;  Surgeon: Marinus Maw, MD;  Location: [facility] CATH LAB;  Service: Cardiovascular;  Laterality: N/A;  . CARPAL TUNNEL WITH CUBITAL TUNNEL Right 07/26/2013   Procedure: RIGHT LIMITED OPEN CARPAL TUNNEL RELEASE ,  RIGHT CUBITAL TUNNEL RELEASE, INSITU VERSES ULNAR NERVE DECOMPRESSION AND ANTERIOR TRANSPOSITION;  Surgeon: Dominica Severin, MD;  Location: [facility] OR;  Service: Orthopedics;  Laterality: Right;  . CESAREAN SECTION    . CHOLECYSTECTOMY    . CYST EXCISION  12/10/2012   THYROID  . INSERT / REPLACE / REMOVE PACEMAKER     2001  . IUD REMOVAL  11/03/2011   Procedure: INTRAUTERINE DEVICE (IUD) REMOVAL;  Surgeon: Myra C. Marice Potter, MD;  Location: [facility] ORS;  Service: Gynecology;  Laterality: N/A;  . NASAL FRACTURE SURGERY     /w plate   . RIGHT HEART CATHETERIZATION N/A 10/26/2014   Procedure: RIGHT HEART CATH;  Surgeon: Dolores Patty, MD;  Location: [facility] CATH LAB;  Service: Cardiovascular;  Laterality: N/A;  . THROAT SURGERY  1994   s/p laser treatment  . THYROGLOSSAL DUCT CYST N/A 12/10/2012   Procedure: REVISION OF THYROGLOSSAL DUCT CYST EXCISION;  Surgeon: Serena Colonel, MD;  Location: [facility] OR;  Service: ENT;  Laterality: N/A;  Revision of Thyroglossal Duct Cyst Excision  . VIDEO BRONCHOSCOPY Bilateral 06/19/2014   Procedure: VIDEO BRONCHOSCOPY WITHOUT FLUORO;  Surgeon: Kalman Shan, MD;  Location: [facility] ENDOSCOPY;  Service: Cardiopulmonary;  Laterality: Bilateral;    OB History    Gravida Para Term Preterm AB Living   1 1 1     1    SAB TAB Ectopic Multiple Live Births                   Home Medications    Prior to Admission medications   Medication Sig Start Date End Date  Taking? Authorizing Provider  bisoprolol (ZEBETA) 5 MG tablet Take 0.5 tablets (2.5 mg total) by mouth daily. 12/18/16  Yes Amy D Clegg, NP  fluocinonide (LIDEX) 0.05 % external solution 1 APPLICATION APPLY ON THE SKIN AS DIRECTED APPLY TO AFFECTED AREAS DAILY ON SCALP AS NEEDED FOR ITCHING. 09/28/15  Yes Historical Provider, MD  furosemide (LASIX) 20 MG tablet Take 1 tablet (20 mg total) by mouth every other day. 12/14/16 03/14/17 Yes Amy D Clegg, NP  insulin aspart (NOVOLOG) 100 UNIT/ML FlexPen Inject 8 Units into the skin 3 (three) times daily with meals. 09/27/16  Yes Renne Musca, MD  Insulin Glargine (LANTUS SOLOSTAR) 100 UNIT/ML Solostar  Pen Inject 35 Units into the skin daily. Patient taking differently: Inject 35 Units into the skin every morning.  10/06/16  Yes Kathee Delton, MD  losartan (COZAAR) 25 MG tablet Take 0.5 tablets (12.5 mg total) by mouth every evening. Patient taking differently: Take 12.5 mg by mouth daily.  11/27/16 02/25/17 Yes Laurey Morale, MD  mycophenolate (CELLCEPT) 500 MG tablet Take 1,000 mg by mouth 2 (two) times daily.    Yes Historical Provider, MD  pantoprazole (PROTONIX) 40 MG tablet Take 1 tablet (40 mg total) by mouth daily. 04/07/16  Yes Alexander Fredric Mare, DO  predniSONE (DELTASONE) 5 MG tablet Take 15 mg by mouth daily with breakfast.    Yes Historical Provider, MD  Vitamin D, Ergocalciferol, (DRISDOL) 50000 units CAPS capsule Take 50,000 Units by mouth once a week. 09/27/16  Yes Historical Provider, MD  cefUROXime (CEFTIN) 500 MG tablet Take 1 tablet (500 mg total) by mouth 2 (two) times daily with a meal. Patient not taking: Reported on 01/04/2017 12/12/16   Amy D Clegg, NP  ipratropium (ATROVENT) 0.06 % nasal spray Place 2 sprays into both nostrils 4 (four) times daily. Patient not taking: Reported on 01/04/2017 10/25/16   Linna Hoff, MD    Family History Family History  Problem Relation Age of Onset  . Heart disease Mother     CHF (no details)  .  Hypertension Mother   . Lupus Mother   . Heart disease Father     Murmur  . Heart disease Sister 77     No details.  History of a pacemaker    Social History Social History  Substance Use Topics  . Smoking status: Former Smoker    Packs/day: 0.25    Years: 0.50    Types: Cigarettes    Quit date: 07/25/1996  . Smokeless tobacco: Never Used  . Alcohol use No     Allergies   Atorvastatin; Sertraline hcl; and Tape   Review of Systems Review of Systems  Constitutional: Positive for fatigue. Negative for chills and fever.  HENT: Negative for congestion and rhinorrhea.   Eyes: Negative for visual disturbance.  Respiratory: Negative for cough, shortness of breath and wheezing.   Cardiovascular: Negative for chest pain and leg swelling.  Gastrointestinal: Positive for abdominal pain, nausea and vomiting. Negative for diarrhea.  Genitourinary: Negative for dysuria and flank pain.  Musculoskeletal: Negative for neck pain and neck stiffness.  Skin: Negative for rash and wound.  Allergic/Immunologic: Negative for immunocompromised state.  Neurological: Positive for light-headedness. Negative for syncope, weakness and headaches.  All other systems reviewed and are negative.    Physical Exam Updated Vital Signs BP (!) 89/60   Pulse 73   Temp 97.7 F (36.5 C) (Oral)   Resp 12   Ht 5\' 5"  (1.651 m)   Wt 151 lb (68.5 kg)   LMP 12/09/2016   SpO2 99%   BMI 25.13 kg/m   Physical Exam  Constitutional: She is oriented to person, place, and time. She appears well-developed and well-nourished. She appears distressed.  HENT:  Head: Normocephalic and atraumatic.  Markedly dry MM  Eyes: Conjunctivae are normal.  Neck: Neck supple.  Cardiovascular: Normal rate, regular rhythm and normal heart sounds.  Exam reveals no friction rub.   No murmur heard. Pulmonary/Chest: Effort normal and breath sounds normal. No respiratory distress. She has no wheezes. She has no rales.  Abdominal:  She exhibits no distension. Bowel sounds are increased. There is generalized tenderness. There  is no rigidity, no rebound, no guarding and no CVA tenderness.  Musculoskeletal: She exhibits no edema.  Neurological: She is alert and oriented to person, place, and time. She exhibits normal muscle tone.  Skin: Skin is warm. Capillary refill takes less than 2 seconds.  Psychiatric: She has a normal mood and affect.  Nursing note and vitals reviewed.    ED Treatments / Results  Labs (all labs ordered are listed, but only abnormal results are displayed) Labs Reviewed  CBC WITH DIFFERENTIAL/PLATELET - Abnormal; Notable for the following:       Result Value   Hemoglobin 9.7 (*)    HCT 31.1 (*)    MCH 24.4 (*)    RDW 17.8 (*)    Neutro Abs 8.0 (*)    All other components within normal limits  COMPREHENSIVE METABOLIC PANEL - Abnormal; Notable for the following:    Glucose, Bld 205 (*)    Calcium 8.1 (*)    Albumin 2.6 (*)    All other components within normal limits  URINALYSIS, ROUTINE W REFLEX MICROSCOPIC - Abnormal; Notable for the following:    Color, Urine AMBER (*)    APPearance CLOUDY (*)    Glucose, UA 50 (*)    Hgb urine dipstick SMALL (*)    Ketones, ur 20 (*)    Leukocytes, UA SMALL (*)    Bacteria, UA MANY (*)    Squamous Epithelial / LPF 6-30 (*)    All other components within normal limits  CK - Abnormal; Notable for the following:    Total CK 3,548 (*)    All other components within normal limits  MRSA PCR SCREENING  URINE CULTURE  CULTURE, BLOOD (ROUTINE X 2)  CULTURE, BLOOD (ROUTINE X 2)  LIPASE, BLOOD  BASIC METABOLIC PANEL  CBC  TSH  MAGNESIUM  HEPATIC FUNCTION PANEL  I-STAT CG4 LACTIC ACID, ED  I-STAT BETA HCG BLOOD, ED ([facility], [facility], [facility] ONLY)  I-STAT CG4 LACTIC ACID, ED  Rosezena Sensor, ED    EKG  EKG Interpretation None       Radiology Dg Chest 2 View  Result Date: 01/04/2017 CLINICAL DATA:  Cough for 2 weeks. EXAM: CHEST  2 VIEW COMPARISON:   12/09/2016 FINDINGS: Pacemaker remains in place with unchanged appearance of right atrium, right ventricle, and coronary sinus leads. The cardiac silhouette remains mildly enlarged. There is increased pulmonary vascular congestion with diffuse interstitial and patchy airspace opacities bilaterally. No pleural effusion or pneumothorax is identified. No acute osseous abnormality is seen. Gaseous distension of bowel loops in the upper abdomen is incompletely evaluated. IMPRESSION: Increasing bilateral lung opacities concerning for edema. Electronically Signed   By: Sebastian Ache M.D.   On: 01/04/2017 18:05   Ct Abdomen Pelvis W Contrast  Result Date: 01/04/2017 CLINICAL DATA:  Abdominal pain and vomiting for several days. EXAM: CT ABDOMEN AND PELVIS WITH CONTRAST TECHNIQUE: Multidetector CT imaging of the abdomen and pelvis was performed using the standard protocol following bolus administration of intravenous contrast. 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 No acute focal infiltrate. The lung bases are otherwise normal. Hepatobiliary: Low-attenuation adjacent to the falciform ligament on image 22 of series 2 not significantly changed since May 2014, likely focal fatty deposition. No suspicious liver masses are identified. The patient is status post cholecystectomy. The  common bile duct is normal. Prominence of intrahepatic bile ducts is unchanged, likely due to previous cholecystectomy. The portal vein is patent. Pancreas: Unremarkable. No pancreatic ductal dilatation or surrounding inflammatory changes. Spleen: Normal in size without focal abnormality. Adrenals/Urinary Tract: Mild prominence of the bilateral renal collecting systems is stable, possibly from previous obstruction or reflux. No acute  hydronephrosis identified. No stones or suspicious masses. No ureterectasis or ureteral stones. The bladder is normal. Stomach/Bowel: The stomach is normal in appearance. The small bowel is normal as well. The colon and appendix are unremarkable. Vascular/Lymphatic: Mild atherosclerotic change in the right common iliac artery. No other atherosclerotic change identified. The abdominal aorta is normal in caliber. Branching vessels are unremarkable. No adenopathy. Reproductive: Uterus and bilateral adnexa are unremarkable. Other: No free air or free fluid. Musculoskeletal: Mild sclerosis in the femoral heads suggest mild AVN. Loss of height in the central T11 vertebral body is stable. No acute bony abnormalities. IMPRESSION: 1. No cause for acute abdominal pain identified. 2. 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 3. Minimal atherosclerotic change in the right common iliac artery. 4. 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 Electronically Signed   By: Gerome Sam III M.D   On: 01/04/2017 20:00    Procedures Procedures (including critical care time)  Medications Ordered in ED Medications  ceFEPIme (MAXIPIME) 1 g in dextrose 5 % 50 mL IVPB (not administered)  mycophenolate (CELLCEPT) capsule 1,000 mg (1,000 mg Oral Given 01/04/17 2337)  insulin glargine (LANTUS) injection 35 Units (not administered)  pantoprazole (PROTONIX) EC tablet 40 mg (not administered)  methylPREDNISolone sodium succinate (SOLU-MEDROL) 40 mg/mL injection 40 mg (40 mg Intravenous Given 01/04/17 2337)  acetaminophen (TYLENOL) tablet 650 mg (not administered)    Or  acetaminophen (TYLENOL) suppository 650 mg (not administered)  enoxaparin (LOVENOX) injection 40 mg (40 mg Subcutaneous Given 01/04/17 2337)  sodium chloride 0.9 % bolus 1,000 mL (0 mLs Intravenous Stopped 01/04/17 1957)  morphine 4 MG/ML injection 4 mg (4 mg Intravenous Given 01/04/17 1724)  iopamidol (ISOVUE-300) 61 % injection (100 mLs  Intravenous Contrast Given 01/04/17 1926)  ceFEPIme (MAXIPIME) 1 g in dextrose 5 % 50 mL IVPB (0 g Intravenous Stopped 01/04/17 2106)     Initial Impression / Assessment and Plan / ED Course  I have reviewed the triage vital signs and the nursing notes.  Pertinent labs & imaging results that were available during my care of the patient were reviewed by me and considered in my medical decision making (see chart for details).  Clinical Course as of Jan 05 241  Sun Jan 04, 2017  1852 CK Total: Caprice Red [CI]    Clinical Course User Index [CI] Shaune Pollack, MD     36 yo F with h/o dilated CM, ILD on chronic immunosuppression here with n/v/d x several days, now hypotension (en route, resolved here). Baseline BP 80-90, however, and pt has a well documented h/o CHF, fluid overload so I am hesitant to activate full 30 cc/kg bolus. On arrival, pt systemically well appearing but does appear intravascularly dry. No LE edema. Suspect viral GI illness, possible UTI now with dehydration. Will give pt gentle fluids given her h/o CHF, though last echo does appear to have improved EF.  Pt BP remains soft but near baseline after fluids. LA normal which is reasuring for absence of septic shock, however, and I do not suspect her relative hypotension is 2/2 systemic infection. UA c/f UTI, however, and will start  on Cefepime given her immune suppressed status. I discussed with Cardiology who recommends Hospitalist admit for primary tam and will be available as consult. Will admit for management of dehydration, n/v, and UTI.   Final Clinical Impressions(s) / ED Diagnoses   Final diagnoses:  Dehydration  Acute cystitis without hematuria    New Prescriptions Current Discharge Medication List       Shaune Pollack, MD 01/05/17 [id number]    Shaune Pollack, MD 01/05/17 [phone]

## 2017-01-04 NOTE — H&P (Signed)
History and Physical    Makayla Vasquez:[id number] DOB: Oct 11, 1981 DOA: 01/04/2017  PCP: Tarri Abernethy, MD  Patient coming from: Home.  Chief Complaint: Nausea vomiting.  HPI: Makayla Vasquez is a 36 y.o. 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 Patient started developing the symptoms 4 days ago after patient had some food at a fast food center. Patient had gone to her PCP 2 days later and was told she was dehydrated. Patient did not take her Lasix on antihypertensive since visiting her PCP. Due to persistent symptoms patient presented to the ER. Denies any chest pain or shortness of breath. Has not had any diarrhea.  ED Course: CT of the abdomen and pelvis was unremarkable. UA shows features concerning for UTI. Patient was given 1 L fluid bolus for hypotension. Blood cultures were obtained and started on antibiotics. Cardiology was consulted by ER physician and they will be seeing patient in consult.  Review of Systems: As per HPI, rest all negative.   Past Medical History:  Diagnosis Date  . Anxiety   . Arthritis    rheumatoid arthritis- mild, no rheumatology care   . Asthma    seasonal allergies   . Asymptomatic LV dysfunction    a. Echo in Dec 2011 with EF 35 to 40%. Felt to be due to paced rhythm. b. EF 25-30% in 07/2014.  . Bipolar affective disorder (HCC)   . Cardiac pacemaker    a. Since age 71 in 56. b. Upgrade to BiV in 2013.  [name] Carpal tunnel syndrome of right wrist   . CHF (congestive heart failure) (HCC)   . Congenital complete AV block   . Depression    bipolar  . Diabetes mellitus without complication (HCC)   . GERD (gastroesophageal reflux disease)   . Hypertension   . Lupus   . Lupus  (systemic lupus erythematosus) (HCC)   . Obesity   . Pneumonitis    a. a/w hypoxia - inflammatory - large workup 07/2014.  . Presence of permanent cardiac pacemaker   . Seizures (HCC)    as a child- from high fever  . Sinus tachycardia     Past Surgical History:  Procedure Laterality Date  . ATRIAL TACH ABLATION N/A 08/14/2014   Procedure: ATRIAL TACH ABLATION;  Surgeon: Marinus Maw, MD;  Location: [facility] CATH LAB;  Service: Cardiovascular;  Laterality: N/A;  . BI-VENTRICULAR PACEMAKER UPGRADE N/A 03/08/2012   Procedure: BI-VENTRICULAR PACEMAKER UPGRADE;  Surgeon: Marinus Maw, MD;  Location: [facility] CATH LAB;  Service: Cardiovascular;  Laterality: N/A;  . CARPAL TUNNEL WITH CUBITAL TUNNEL Right 07/26/2013   Procedure: RIGHT LIMITED OPEN CARPAL TUNNEL RELEASE ,  RIGHT CUBITAL TUNNEL RELEASE, INSITU VERSES ULNAR NERVE DECOMPRESSION AND ANTERIOR TRANSPOSITION;  Surgeon: Dominica Severin, MD;  Location: [facility] OR;  Service: Orthopedics;  Laterality: Right;  . CESAREAN SECTION    . CHOLECYSTECTOMY    . CYST EXCISION  12/10/2012   THYROID  . INSERT / REPLACE / REMOVE PACEMAKER     2001  . IUD REMOVAL  11/03/2011   Procedure: INTRAUTERINE DEVICE (IUD) REMOVAL;  Surgeon: Myra C. Marice Potter, MD;  Location: [facility] ORS;  Service: Gynecology;  Laterality: N/A;  . NASAL FRACTURE SURGERY     /w plate   . RIGHT HEART CATHETERIZATION N/A  10/26/2014   Procedure: RIGHT HEART CATH;  Surgeon: Dolores Patty, MD;  Location: [facility] CATH LAB;  Service: Cardiovascular;  Laterality: N/A;  . THROAT SURGERY  1994   s/p laser treatment  . THYROGLOSSAL DUCT CYST N/A 12/10/2012   Procedure: REVISION OF THYROGLOSSAL DUCT CYST EXCISION;  Surgeon: Serena Colonel, MD;  Location: [facility] OR;  Service: ENT;  Laterality: N/A;  Revision of Thyroglossal Duct Cyst Excision  . VIDEO BRONCHOSCOPY Bilateral 06/19/2014   Procedure: VIDEO BRONCHOSCOPY WITHOUT FLUORO;  Surgeon: Kalman Shan, MD;  Location: [facility] ENDOSCOPY;  Service: Cardiopulmonary;   Laterality: Bilateral;     reports that she quit smoking about 20 years ago. Her smoking use included Cigarettes. She has a 0.12 pack-year smoking history. She has never used smokeless tobacco. She reports that she does not drink alcohol or use drugs.  Allergies  Allergen Reactions  . 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 Sertraline Hcl Hives  . Tape Other (See Comments)    Burns skin    Family History  Problem Relation Age of Onset  . Heart disease Mother     CHF (no details)  . Hypertension Mother   . Lupus Mother   . Heart disease Father     Murmur  . Heart disease Sister 18     No details.  History of a pacemaker    Prior to Admission medications   Medication Sig Start Date End Date Taking? Authorizing Provider  bisoprolol (ZEBETA) 5 MG tablet Take 0.5 tablets (2.5 mg total) by mouth daily. 12/18/16  Yes Amy D Clegg, NP  fluocinonide (LIDEX) 0.05 % external solution 1 APPLICATION APPLY ON THE SKIN AS DIRECTED APPLY TO AFFECTED AREAS DAILY ON SCALP AS NEEDED FOR ITCHING. 09/28/15  Yes Historical Provider, MD  furosemide (LASIX) 20 MG tablet Take 1 tablet (20 mg total) by mouth every other day. 12/14/16 03/14/17 Yes Amy D Clegg, NP  insulin aspart (NOVOLOG) 100 UNIT/ML FlexPen Inject 8 Units into the skin 3 (three) times daily with meals. 09/27/16  Yes Renne Musca, MD  Insulin Glargine (LANTUS SOLOSTAR) 100 UNIT/ML Solostar Pen Inject 35 Units into the skin daily. Patient taking differently: Inject 35 Units into the skin every morning.  10/06/16  Yes Kathee Delton, MD  losartan (COZAAR) 25 MG tablet Take 0.5 tablets (12.5 mg total) by mouth every evening. Patient taking differently: Take 12.5 mg by mouth daily.  11/27/16 02/25/17 Yes Laurey Morale, MD  mycophenolate (CELLCEPT) 500 MG tablet Take 1,000 mg by mouth 2 (two) times daily.    Yes Historical Provider, MD  pantoprazole (PROTONIX) 40 MG tablet Take 1  tablet (40 mg total) by mouth daily. 04/07/16  Yes Alexander Fredric Mare, DO  predniSONE (DELTASONE) 5 MG tablet Take 15 mg by mouth daily with breakfast.    Yes Historical Provider, MD  Vitamin D, Ergocalciferol, (DRISDOL) 50000 units CAPS capsule Take 50,000 Units by mouth once a week. 09/27/16  Yes Historical Provider, MD  cefUROXime (CEFTIN) 500 MG tablet Take 1 tablet (500 mg total) by mouth 2 (two) times daily with a meal. Patient not taking: Reported on 01/04/2017 12/12/16   Amy D Clegg, NP  ipratropium (ATROVENT) 0.06 % nasal spray Place 2 sprays into both nostrils 4 (four) times daily. Patient not taking: Reported on 01/04/2017 10/25/16   Linna Hoff, MD    Physical Exam: Vitals:   01/04/17 2030 01/04/17 2100 01/04/17  2130 01/04/17 2200  BP: (!) 86/44 (!) 66/48 (!) 86/49 (!) 85/49  Pulse: 92 94 90 93  Resp: (!) 43 (!) 29 (!) 44 (!) 39  Temp:      TempSrc:      SpO2: 97% 99% 97% 97%  Weight:      Height:          Constitutional: Moderately built and nourished. Vitals:   01/04/17 2030 01/04/17 2100 01/04/17 2130 01/04/17 2200  BP: (!) 86/44 (!) 66/48 (!) 86/49 (!) 85/49  Pulse: 92 94 90 93  Resp: (!) 43 (!) 29 (!) 44 (!) 39  Temp:      TempSrc:      SpO2: 97% 99% 97% 97%  Weight:      Height:       Eyes: Anicteric no pallor. ENMT: No discharge from the ears eyes nose and mouth. Neck: No mass felt. No neck rigidity. Respiratory: No rhonchi or crepitations. Cardiovascular: S1-S2 and no murmurs appreciated. Abdomen: Soft nontender bowel sounds present. No guarding or rigidity. Musculoskeletal: No edema. No joint effusion. Skin: No rash. Skin appears warm. Neurologic: Alert awake oriented to time place and person. Moves all extremities. Psychiatric: Appears normal. Normal affect.   Labs on Admission: I have personally reviewed following labs and imaging studies  CBC:  Recent Labs Lab 01/04/17 1723  WBC 9.1  NEUTROABS 8.0*  HGB 9.7*  HCT 31.1*  MCV 78.3  PLT  374   Basic Metabolic Panel:  Recent Labs Lab 01/04/17 1723  NA 135  K 4.3  CL 105  CO2 22  GLUCOSE 205*  BUN 8  CREATININE 0.55  CALCIUM 8.1*   GFR: Estimated Creatinine Clearance: 94.5 mL/min (by C-G formula based on SCr of 0.55 mg/dL). Liver Function Tests:  Recent Labs Lab 01/04/17 1723  AST 39  ALT 18  ALKPHOS 50  BILITOT 0.9  PROT 6.8  ALBUMIN 2.6*    Recent Labs Lab 01/04/17 1727  LIPASE 12   No results for input(s): AMMONIA in the last 168 hours. Coagulation Profile: No results for input(s): INR, PROTIME in the last 168 hours. Cardiac Enzymes:  Recent Labs Lab 01/04/17 1723  CKTOTAL 3,548*   BNP (last 3 results) No results for input(s): PROBNP in the last 8760 hours. HbA1C: No results for input(s): HGBA1C in the last 72 hours. CBG: No results for input(s): GLUCAP in the last 168 hours. Lipid Profile: No results for input(s): CHOL, HDL, LDLCALC, TRIG, CHOLHDL, LDLDIRECT in the last 72 hours. Thyroid Function Tests: No results for input(s): TSH, T4TOTAL, FREET4, T3FREE, THYROIDAB in the last 72 hours. Anemia Panel: No results for input(s): VITAMINB12, FOLATE, FERRITIN, TIBC, IRON, RETICCTPCT in the last 72 hours. Urine analysis:    Component Value Date/Time   COLORURINE AMBER (A) 01/04/2017 1836   APPEARANCEUR CLOUDY (A) 01/04/2017 1836   LABSPEC 1.018 01/04/2017 1836   PHURINE 5.0 01/04/2017 1836   GLUCOSEU 50 (A) 01/04/2017 1836   HGBUR SMALL (A) 01/04/2017 1836   HGBUR negative 05/21/2010 0912   BILIRUBINUR NEGATIVE 01/04/2017 1836   BILIRUBINUR NEG 04/14/2016 1622   KETONESUR 20 (A) 01/04/2017 1836   PROTEINUR NEGATIVE 01/04/2017 1836   UROBILINOGEN 0.2 04/14/2016 1622   UROBILINOGEN 1.0 07/08/2015 0346   NITRITE NEGATIVE 01/04/2017 1836   LEUKOCYTESUR SMALL (A) 01/04/2017 1836   Sepsis Labs: @LABRCNTIP (procalcitonin:4,lacticidven:4) )No results found for this or any previous visit (from the past 240 hour(s)).   Radiological  Exams on Admission: Dg Chest 2 View  Result Date: 01/04/2017 CLINICAL DATA:  Cough for 2 weeks. EXAM: CHEST  2 VIEW COMPARISON:  12/09/2016 FINDINGS: Pacemaker remains in place with unchanged appearance of right atrium, right ventricle, and coronary sinus leads. The cardiac silhouette remains mildly enlarged. There is increased pulmonary vascular congestion with diffuse interstitial and patchy airspace opacities bilaterally. No pleural effusion or pneumothorax is identified. No acute osseous abnormality is seen. Gaseous distension of bowel loops in the upper abdomen is incompletely evaluated. IMPRESSION: Increasing bilateral lung opacities concerning for edema. Electronically Signed   By: Sebastian Ache M.D.   On: 01/04/2017 18:05   Ct Abdomen Pelvis W Contrast  Result Date: 01/04/2017 CLINICAL DATA:  Abdominal pain and vomiting for several days. EXAM: CT ABDOMEN AND PELVIS WITH CONTRAST TECHNIQUE: Multidetector CT imaging of the abdomen and pelvis was performed using the standard protocol following bolus administration of intravenous contrast. 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 No acute focal infiltrate. The lung bases are otherwise normal. Hepatobiliary: Low-attenuation adjacent to the falciform ligament on image 22 of series 2 not significantly changed since May 2014, likely focal fatty deposition. No suspicious liver masses are identified. The patient is status post cholecystectomy. The common bile duct is normal. Prominence of intrahepatic bile ducts is unchanged, likely due to previous cholecystectomy. The portal vein is patent. Pancreas: Unremarkable. No pancreatic ductal dilatation or surrounding inflammatory changes. Spleen: Normal in size without focal abnormality. Adrenals/Urinary  Tract: Mild prominence of the bilateral renal collecting systems is stable, possibly from previous obstruction or reflux. No acute hydronephrosis identified. No stones or suspicious masses. No ureterectasis or ureteral stones. The bladder is normal. Stomach/Bowel: The stomach is normal in appearance. The small bowel is normal as well. The colon and appendix are unremarkable. Vascular/Lymphatic: Mild atherosclerotic change in the right common iliac artery. No other atherosclerotic change identified. The abdominal aorta is normal in caliber. Branching vessels are unremarkable. No adenopathy. Reproductive: Uterus and bilateral adnexa are unremarkable. Other: No free air or free fluid. Musculoskeletal: Mild sclerosis in the femoral heads suggest mild AVN. Loss of height in the central T11 vertebral body is stable. No acute bony abnormalities. IMPRESSION: 1. No cause for acute abdominal pain identified. 2. 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 3. Minimal atherosclerotic change in the right common iliac artery. 4. 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 Electronically Signed   By: Gerome Sam III M.D   On: 01/04/2017 20:00    EKG: Independently reviewed. Paced rhythm.  Assessment/Plan Active Problems:   Congenital complete AV block   Congestive dilated cardiomyopathy (HCC)   Chronic respiratory failure with hypoxia (HCC)   Diabetes mellitus type 2 in obese (HCC)   Antisynthetase syndrome (HCC)   Adrenal insufficiency (HCC)   Anemia of chronic disease   SIRS (systemic inflammatory response syndrome) (HCC)   UTI (urinary tract infection)   Nausea & vomiting    1. SIRS/possible developing sepsis - from UTI. Patient is placed on empiric antibiotics. Follow cultures. Patient did receive 1 L fluid bolus will hold off any further fluids given the history of nonischemic cardiomyopathy. 2. Nausea vomiting could be from UTI - closely observe. Abdomen is  benign. 3. Anti-synthetase syndrome - patient is on CellCept and prednisone. Since patient's oral intake is not reliable I have placed patient  on IV Solu-Medrol until patient can take reliably oral prednisone to avoid adrenal insufficiency. 4. Nonischemic cardiomyopathy with chronic respiratory failure - holding of Lasix and beta blocker and Cozaar due to low normal blood pressure. 5. Diabetes mellitus type 2 - continue long-acting insulin closely follow CBGs since patient is in poor oral intake. 6. Chronic anemia - follow CBC. 7. 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   DVT prophylaxis: Lovenox. Code Status: Full code.  Family Communication: Discussed with patient.  Disposition Plan: Home.  Consults called: ER physician discussed with cardiologist.  Admission status: Inpatient.    Eduard Clos MD [facility] Pager [phone].  If 7PM-7AM, please contact night-coverage [email or web address] Password [facility]  01/04/2017, 10:37 PM

## 2017-01-04 NOTE — ED Triage Notes (Signed)
Per [facility] patient from home for abd pain and vomiting since Wed after eating at [facility]. Patient went to PCP on Friday for vomiting/dehydration and instructed patient to go home and oral hydrate. Patient unable to keep oral fluids down.  Patient denies diarrhea. Per EMS patient was hypotensive on seen 70/40's.

## 2017-01-04 NOTE — Progress Notes (Signed)
Pharmacy Antibiotic Note  Makayla Vasquez is a 36 y.o. female admitted on 01/04/2017 with N/V and abdominal pain. Urinalysis is positive. Pharmacy is consulted for Cefepime dosing for UTI. Patient is immunocompromised d/t Lupus and Cellcept/prednisone therapy. During recent admission she was treated for UTI with Rocephin and transitioned to Ceftin for discharge. Cultures remained negative.     Plan: Start Cefepime 1g IV q8h.  Follow up renal fxn, culture results, and clinical course. Advocate for de-escalation as soon as appropriate.    Height: 5\' 5"  (165.1 cm) Weight: 151 lb (68.5 kg) IBW/kg (Calculated) : 57  Temp (24hrs), Avg:99.2 F (37.3 C), Min:99.2 F (37.3 C), Max:99.2 F (37.3 C)   Recent Labs Lab 01/04/17 1723 01/04/17 1734  WBC 9.1  --   CREATININE 0.55  --   LATICACIDVEN  --  0.97    Estimated Creatinine Clearance: 94.5 mL/min (by C-G formula based on SCr of 0.55 mg/dL).    Allergies  Allergen Reactions  . 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 Sertraline Hcl Hives  . Tape Other (See Comments)    Burns skin    Antimicrobials this admission: Cefepime 3/18 >>   Dose adjustments this admission:   Microbiology results: UCx: ordered   Thank you for allowing pharmacy to be a part of this patient's care.  4/18, PharmD, pager [phone]. 01/04/2017,7:39 PM.

## 2017-01-04 NOTE — ED Notes (Signed)
Report given to [facility] and pt ready for transport

## 2017-01-04 NOTE — ED Notes (Signed)
ED Provider at bedside. 

## 2017-01-04 NOTE — Telephone Encounter (Signed)
[facility] After Hours Telephone Line   Patient's friend calling for her, however patient present in background to answer questions. For the past five days, patient has had worsening abdominal pain and vomiting. She has also not had a bowel movement since this time. She said she ate at [employer] five days ago prior to symptom onset. She is able to drink minimal amount of fluids. Is not tolerating solid PO at all. She reports orange vomitus and says she has noticed some blood in it. Denies eating or drinking anything orange. She has a history of diabetes and says she has been taking her insulin as prescribed, however her blood sugar has been lower than usual since her symptoms began. Concern for dehydration as patient with minimal PO intake for 5 days. Combination of vomiting and abdominal pain in patient with diabetes also concerning for possible diabetic gastroparesis. Recommended patient come to ED to evaluate hydration status and hypoglycemia. Patient and her friend voiced understanding and appreciation.   Tarri Abernethy, MD, MPH PGY-2 [facility] Pager [phone]

## 2017-01-05 ENCOUNTER — Encounter (HOSPITAL_COMMUNITY): Payer: Self-pay | Admitting: Radiology

## 2017-01-05 ENCOUNTER — Inpatient Hospital Stay (HOSPITAL_COMMUNITY): Payer: Medicare Other

## 2017-01-05 ENCOUNTER — Ambulatory Visit: Payer: Medicare Other | Attending: Physician Assistant | Admitting: Rehabilitation

## 2017-01-05 DIAGNOSIS — E1169 Type 2 diabetes mellitus with other specified complication: Secondary | ICD-10-CM

## 2017-01-05 DIAGNOSIS — E669 Obesity, unspecified: Secondary | ICD-10-CM

## 2017-01-05 DIAGNOSIS — A419 Sepsis, unspecified organism: Secondary | ICD-10-CM

## 2017-01-05 DIAGNOSIS — D8989 Other specified disorders involving the immune mechanism, not elsewhere classified: Secondary | ICD-10-CM

## 2017-01-05 DIAGNOSIS — Q246 Congenital heart block: Secondary | ICD-10-CM

## 2017-01-05 DIAGNOSIS — I5022 Chronic systolic (congestive) heart failure: Secondary | ICD-10-CM

## 2017-01-05 DIAGNOSIS — J9611 Chronic respiratory failure with hypoxia: Secondary | ICD-10-CM

## 2017-01-05 LAB — RESPIRATORY PANEL BY PCR
Adenovirus: NOT DETECTED
BORDETELLA PERTUSSIS-RVPCR: NOT DETECTED
Chlamydophila pneumoniae: NOT DETECTED
Coronavirus 229E: NOT DETECTED
Coronavirus HKU1: NOT DETECTED
Coronavirus NL63: NOT DETECTED
Coronavirus OC43: NOT DETECTED
INFLUENZA B-RVPPCR: NOT DETECTED
Influenza A: NOT DETECTED
MYCOPLASMA PNEUMONIAE-RVPPCR: NOT DETECTED
Metapneumovirus: NOT DETECTED
Parainfluenza Virus 1: NOT DETECTED
Parainfluenza Virus 2: NOT DETECTED
Parainfluenza Virus 3: NOT DETECTED
Parainfluenza Virus 4: NOT DETECTED
RESPIRATORY SYNCYTIAL VIRUS-RVPPCR: NOT DETECTED
Rhinovirus / Enterovirus: NOT DETECTED

## 2017-01-05 LAB — BASIC METABOLIC PANEL
ANION GAP: 7 (ref 5–15)
BUN: 8 mg/dL (ref 6–20)
CALCIUM: 8.2 mg/dL — AB (ref 8.9–10.3)
CHLORIDE: 108 mmol/L (ref 101–111)
CO2: 22 mmol/L (ref 22–32)
Creatinine, Ser: 0.42 mg/dL — ABNORMAL LOW (ref 0.44–1.00)
GFR calc non Af Amer: >60 mL/min (ref >60)
GLUCOSE: 177 mg/dL — AB (ref 65–99)
Potassium: 4.1 mmol/L (ref 3.5–5.1)
Sodium: 137 mmol/L (ref 135–145)

## 2017-01-05 LAB — PROCALCITONIN: PROCALCITONIN: 0.46 ng/mL

## 2017-01-05 LAB — HEPATIC FUNCTION PANEL
ALBUMIN: 2.6 g/dL — AB (ref 3.5–5.0)
ALK PHOS: 47 U/L (ref 38–126)
ALT: 18 U/L (ref 14–54)
AST: 31 U/L (ref 15–41)
Bilirubin, Direct: 0.2 mg/dL (ref 0.1–0.5)
Indirect Bilirubin: 0.5 mg/dL (ref 0.3–0.9)
Total Bilirubin: 0.7 mg/dL (ref 0.3–1.2)
Total Protein: 6.8 g/dL (ref 6.5–8.1)

## 2017-01-05 LAB — CBC
HCT: 26.6 % — ABNORMAL LOW (ref 36.0–46.0)
HEMOGLOBIN: 8.2 g/dL — AB (ref 12.0–15.0)
MCH: 24.6 pg — AB (ref 26.0–34.0)
MCHC: 30.8 g/dL (ref 30.0–36.0)
MCV: 79.6 fL (ref 78.0–100.0)
Platelets: 307 10*3/uL (ref 150–400)
RBC: 3.34 MIL/uL — AB (ref 3.87–5.11)
RDW: 17.9 % — ABNORMAL HIGH (ref 11.5–15.5)
WBC: 6 10*3/uL (ref 4.0–10.5)

## 2017-01-05 LAB — BRAIN NATRIURETIC PEPTIDE: B Natriuretic Peptide: 54.4 pg/mL (ref 0.0–100.0)

## 2017-01-05 LAB — GLUCOSE, CAPILLARY
GLUCOSE-CAPILLARY: 198 mg/dL — AB (ref 65–99)
GLUCOSE-CAPILLARY: 268 mg/dL — AB (ref 65–99)
GLUCOSE-CAPILLARY: 337 mg/dL — AB (ref 65–99)
Glucose-Capillary: 328 mg/dL — ABNORMAL HIGH (ref 65–99)

## 2017-01-05 LAB — MAGNESIUM: Magnesium: 1.8 mg/dL (ref 1.7–2.4)

## 2017-01-05 LAB — MRSA PCR SCREENING: MRSA by PCR: NEGATIVE

## 2017-01-05 LAB — TSH: TSH: 0.946 u[IU]/mL (ref 0.350–4.500)

## 2017-01-05 MED ORDER — POTASSIUM CHLORIDE IN NACL 20-0.9 MEQ/L-% IV SOLN
INTRAVENOUS | Status: DC
  Administered 2017-01-05: 15:00:00 via INTRAVENOUS
  Filled 2017-01-05 (×2): qty 1000

## 2017-01-05 MED ORDER — SODIUM CHLORIDE 0.9 % IV SOLN: INTRAVENOUS | Status: DC

## 2017-01-05 MED ORDER — INSULIN GLARGINE 100 UNIT/ML ~~LOC~~ SOLN
20.0000 [IU] | Freq: Every morning | SUBCUTANEOUS | Status: DC
  Administered 2017-01-05 – 2017-01-06 (×2): 20 [IU] via SUBCUTANEOUS
  Filled 2017-01-05 (×3): qty 0.2

## 2017-01-05 MED ORDER — INSULIN ASPART 100 UNIT/ML ~~LOC~~ SOLN
0.0000 [IU] | Freq: Every day | SUBCUTANEOUS | Status: DC
  Administered 2017-01-05: 4 [IU] via SUBCUTANEOUS

## 2017-01-05 MED ORDER — INSULIN ASPART 100 UNIT/ML ~~LOC~~ SOLN
0.0000 [IU] | Freq: Three times a day (TID) | SUBCUTANEOUS | Status: DC
  Administered 2017-01-05: 5 [IU] via SUBCUTANEOUS
  Administered 2017-01-05: 7 [IU] via SUBCUTANEOUS
  Administered 2017-01-06: 5 [IU] via SUBCUTANEOUS
  Administered 2017-01-06 – 2017-01-07 (×2): 2 [IU] via SUBCUTANEOUS

## 2017-01-05 NOTE — Progress Notes (Addendum)
PROGRESS NOTE  Makayla Vasquez DDU:[id number] DOB: [birth year] DOA: 01/04/2017 PCP: Tarri Abernethy, MD  Brief History:  36 y.o. 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 The patient went to see her rheumatologist on 01/02/2017 at which time, her CellCept was increased to 1000 mg twice a day and prednisone decreased to 15 mg daily. The patient states that her shortness of breath is a little worse than usual in the past week, and she has a worsening cough with clear to white sputum. She normally is on home oxygen 2 L at rest, 4 L with activity. She has had some subjective chills. She denies any diarrhea, hematochezia, melena, dysuria, hematuria. She complains of some epigastric abdominal pain. She denies any worsening edema or orthopnea. She does not weigh herself. She states that her leg weakness and muscle pain is the same as usual. In the emergency department, the patient was noted to be hypotensive with blood pressure 77/32 with low-grade temperature 99.81F. The patient was fluid resuscitated and started on cefepime.  Assessment/Plan: Sepsis -Source is likely urine although cannot rule out pulmonary source -Continue cefepime pending culture data -Lactic acid peaked at 0.97 -Judicious IV fluids -Stress dosing steroids  Pyuria -UA with TNTC WBC -Suspect pyelonephritis clinically -Continue cefepime pending culture data  Pulmonary infiltrates -Chest x-ray shows increased bilateral interstitial and airspace opacities -Viral respiratory panel -Procalcitonin -BNP -Continue cefepime -CT chest without contrast  Intractable nausea and vomiting -Vomiting appears to have improved -Likely  related to UTI/pyelonephritis -Normal LFTs and lipase -01/04/2017 CT abdomen and pelvis--chronic reticular nodular changes at the lung bases. No acute findings -Clear liquid diet -Judicious IV fluids  Chronic respiratory failure with hypoxia -Presently stable on 3 L nasal cannula -At home--2 L at rest, 4 L with activity -Flutter valve -pt has hx of ILD, follows Dr. Jetty Duhamel  Diabetes mellitus type 2 -Check Hemoglobin A1c -Reduced dose Lantus -NovoLog sliding scale  Nonischemic cardiomyopathy/chronic systolic heart failure -12/11/2016 echo EF 45-50%, moderate TR, mild-to-moderate MR, PASP 38 -Holding furosemide -12/12/2017 discharge weight 144 pounds -Restart bisoprolol once blood pressure improves  Anti-synthetase syndrome -CPK 3548 -Continue mycophenolate -am CK     Disposition Plan:   Home in 2-3days  Family Communication:  No Family at bedside  Consultants:  none  Code Status:  FULL  DVT Prophylaxis:  [place] Lovenox   Procedures: As Listed in Progress Note Above  Antibiotics: None    Subjective: Patient denies any fevers, chills, chest pain. She complains of shortness of breath and coughing with occasional sputum production of white sputum. Denies any hemoptysis. She states that vomiting has improved. Complains of epigastric abdominal pain which is slightly improved. Denies any dysuria, hematuria, headache, neck pain.   Objective: Vitals:   01/05/17 0528 01/05/17 0600 01/05/17 0700 01/05/17 0800  BP:  (!) 101/56 100/64 103/64  Pulse:  63 73 82  Resp:  (!) 29 17 16   Temp: (!) 96.9 F (36.1 C)   97.4 F (36.3 C)  TempSrc: Axillary   Oral  SpO2:  99% 100% 96%  Weight:      Height:        Intake/Output Summary (Last 24 hours) at 01/05/17 0916 Last data filed at 01/05/17 0500  Gross per  24 hour  Intake             2600 ml  Output              650 ml  Net             1950 ml   Weight change:  Exam:   General:  Pt is alert, follows commands  appropriately, not in acute distress  HEENT: No icterus, No thrush, No neck mass, [place]/AT  Cardiovascular: RRR, S1/S2, no rubs, no gallops  Respiratory: Scattered bilateral rales. No wheezing.   Abdomen: Soft/+BS, epigastric  tender, non distended, no guarding  Extremities: No edema, No lymphangitis, No petechiae, No rashes, no synovitis   Data Reviewed: I have personally reviewed following labs and imaging studies Basic Metabolic Panel:  Recent Labs Lab 01/04/17 1723 01/05/17 0327  NA 135 137  K 4.3 4.1  CL 105 108  CO2 22 22  GLUCOSE 205* 177*  BUN 8 8  CREATININE 0.55 0.42*  CALCIUM 8.1* 8.2*  MG  --  1.8   Liver Function Tests:  Recent Labs Lab 01/04/17 1723 01/05/17 0327  AST 39 31  ALT 18 18  ALKPHOS 50 47  BILITOT 0.9 0.7  PROT 6.8 6.8  ALBUMIN 2.6* 2.6*    Recent Labs Lab 01/04/17 1727  LIPASE 12   No results for input(s): AMMONIA in the last 168 hours. Coagulation Profile: No results for input(s): INR, PROTIME in the last 168 hours. CBC:  Recent Labs Lab 01/04/17 1723 01/05/17 0327  WBC 9.1 6.0  NEUTROABS 8.0*  --   HGB 9.7* 8.2*  HCT 31.1* 26.6*  MCV 78.3 79.6  PLT 374 307   Cardiac Enzymes:  Recent Labs Lab 01/04/17 1723  CKTOTAL 3,548*   BNP: Invalid input(s): POCBNP CBG:  Recent Labs Lab 01/05/17 0822  GLUCAP 198*   HbA1C: No results for input(s): HGBA1C in the last 72 hours. Urine analysis:    Component Value Date/Time   COLORURINE AMBER (A) 01/04/2017 1836   APPEARANCEUR CLOUDY (A) 01/04/2017 1836   LABSPEC 1.018 01/04/2017 1836   PHURINE 5.0 01/04/2017 1836   GLUCOSEU 50 (A) 01/04/2017 1836   HGBUR SMALL (A) 01/04/2017 1836   HGBUR negative 05/21/2010 0912   BILIRUBINUR NEGATIVE 01/04/2017 1836   BILIRUBINUR NEG 04/14/2016 1622   KETONESUR 20 (A) 01/04/2017 1836   PROTEINUR NEGATIVE 01/04/2017 1836   UROBILINOGEN 0.2 04/14/2016 1622   UROBILINOGEN 1.0 07/08/2015 0346   NITRITE NEGATIVE 01/04/2017 1836    LEUKOCYTESUR SMALL (A) 01/04/2017 1836   Sepsis Labs: @LABRCNTIP (procalcitonin:4,lacticidven:4) ) Recent Results (from the past 240 hour(s))  MRSA PCR Screening     Status: None   Collection Time: 01/04/17 11:05 PM  Result Value Ref Range Status   MRSA by PCR NEGATIVE NEGATIVE Final    Comment:        The GeneXpert MRSA Assay (FDA approved for NASAL specimens only), is one component of a comprehensive MRSA colonization surveillance program. It is not intended to diagnose MRSA infection nor to guide or monitor treatment for MRSA infections.      Scheduled Meds: . ceFEPime (MAXIPIME) IV  1 g Intravenous Q8H  . enoxaparin (LOVENOX) injection  40 mg Subcutaneous QHS  . insulin glargine  35 Units Subcutaneous q morning - 10a  . methylPREDNISolone (SOLU-MEDROL) injection  40 mg Intravenous Daily  . mycophenolate  1,000 mg Oral BID  . pantoprazole  40 mg Oral Daily   Continuous Infusions:  Procedures/Studies: Dg  Chest 2 View  Result Date: 01/04/2017 CLINICAL DATA:  Cough for 2 weeks. EXAM: CHEST  2 VIEW COMPARISON:  12/09/2016 FINDINGS: Pacemaker remains in place with unchanged appearance of right atrium, right ventricle, and coronary sinus leads. The cardiac silhouette remains mildly enlarged. There is increased pulmonary vascular congestion with diffuse interstitial and patchy airspace opacities bilaterally. No pleural effusion or pneumothorax is identified. No acute osseous abnormality is seen. Gaseous distension of bowel loops in the upper abdomen is incompletely evaluated. IMPRESSION: Increasing bilateral lung opacities concerning for edema. Electronically Signed   By: Sebastian Ache M.D.   On: 01/04/2017 18:05   Dg Chest 2 View  Result Date: 12/09/2016 CLINICAL DATA:  Dyspnea.  Midline chest pain.  Congenital AV block. EXAM: CHEST  2 VIEW COMPARISON:  11/24/2016. FINDINGS: The heart is enlarged. Pacemaker leads are attached the RIGHT atrium, RIGHT ventricle, and coronary sinus.  Interstitial prominence bilaterally is redemonstrated, slightly worse, likely edema. No appreciable airspace consolidation or pleural effusion. IMPRESSION: Slight worsening aeration.  Correlate clinically for CHF. Electronically Signed   By: Elsie Stain M.D.   On: 12/09/2016 15:48   Ct Abdomen Pelvis W Contrast  Result Date: 01/04/2017 CLINICAL DATA:  Abdominal pain and vomiting for several days. EXAM: CT ABDOMEN AND PELVIS WITH CONTRAST TECHNIQUE: Multidetector CT imaging of the abdomen and pelvis was performed using the standard protocol following bolus administration of intravenous contrast. 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 No acute focal infiltrate. The lung bases are otherwise normal. Hepatobiliary: Low-attenuation adjacent to the falciform ligament on image 22 of series 2 not significantly changed since May 2014, likely focal fatty deposition. No suspicious liver masses are identified. The patient is status post cholecystectomy. The common bile duct is normal. Prominence of intrahepatic bile ducts is unchanged, likely due to previous cholecystectomy. The portal vein is patent. Pancreas: Unremarkable. No pancreatic ductal dilatation or surrounding inflammatory changes. Spleen: Normal in size without focal abnormality. Adrenals/Urinary Tract: Mild prominence of the bilateral renal collecting systems is stable, possibly from previous obstruction or reflux. No acute hydronephrosis identified. No stones or suspicious masses. No ureterectasis or ureteral stones. The bladder is normal. Stomach/Bowel: The stomach is normal in appearance. The small bowel is normal as well. The colon and appendix are unremarkable. Vascular/Lymphatic: Mild atherosclerotic change in the right common iliac artery.  No other atherosclerotic change identified. The abdominal aorta is normal in caliber. Branching vessels are unremarkable. No adenopathy. Reproductive: Uterus and bilateral adnexa are unremarkable. Other: No free air or free fluid. Musculoskeletal: Mild sclerosis in the femoral heads suggest mild AVN. Loss of height in the central T11 vertebral body is stable. No acute bony abnormalities. IMPRESSION: 1. No cause for acute abdominal pain identified. 2. 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 3. Minimal atherosclerotic change in the right common iliac artery. 4. 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 Electronically Signed   By: Gerome Sam III M.D   On: 01/04/2017 20:00    Matthe Sloane, DO  [facility] Pager [phone]  If 7PM-7AM, please contact night-coverage [email or web address] Password [email or web address] 01/05/2017, 9:16 AM   LOS: 1 day

## 2017-01-05 NOTE — Care Management Note (Signed)
Case Management Note  Patient Details  Name: FLORETTA PETRO MRN: [id number] Date of Birth: [birth year]  Subjective/Objective:     hypotension               Action/Plan:Date:  January 05, 2017 Chart reviewed for concurrent status and case management needs. Will continue to follow patient progress. Discharge Planning: following for needs Expected discharge date: [id number] Marcelle Smiling, BSN, [place], [place]   [phone]   Expected Discharge Date:                  Expected Discharge Plan:  Home/Self Care  In-House Referral:     Discharge planning Services     Post Acute Care Choice:    Choice offered to:     DME Arranged:    DME Agency:     HH Arranged:    HH Agency:     Status of Service:  In process, will continue to follow  If discussed at Long Length of Stay Meetings, dates discussed:    Additional Comments:  Golda Acre, RN 01/05/2017, 10:55 AM

## 2017-01-06 DIAGNOSIS — A4181 Sepsis due to Enterococcus: Secondary | ICD-10-CM

## 2017-01-06 DIAGNOSIS — R1110 Vomiting, unspecified: Secondary | ICD-10-CM

## 2017-01-06 DIAGNOSIS — E860 Dehydration: Secondary | ICD-10-CM

## 2017-01-06 DIAGNOSIS — G43A1 Cyclical vomiting, intractable: Secondary | ICD-10-CM

## 2017-01-06 DIAGNOSIS — J9610 Chronic respiratory failure, unspecified whether with hypoxia or hypercapnia: Secondary | ICD-10-CM

## 2017-01-06 DIAGNOSIS — R0902 Hypoxemia: Secondary | ICD-10-CM

## 2017-01-06 LAB — GLUCOSE, CAPILLARY
Glucose-Capillary: 115 mg/dL — ABNORMAL HIGH (ref 65–99)
Glucose-Capillary: 197 mg/dL — ABNORMAL HIGH (ref 65–99)
Glucose-Capillary: 254 mg/dL — ABNORMAL HIGH (ref 65–99)
Glucose-Capillary: 149 mg/dL — ABNORMAL HIGH (ref 65–99)

## 2017-01-06 LAB — CK: CK TOTAL: 895 U/L — AB (ref 38–234)

## 2017-01-06 LAB — HEMOGLOBIN A1C
HEMOGLOBIN A1C: 6.8 % — AB (ref 4.8–5.6)
MEAN PLASMA GLUCOSE: 148 mg/dL

## 2017-01-06 MED ORDER — VANCOMYCIN HCL IN DEXTROSE 750-5 MG/150ML-% IV SOLN
750.0000 mg | Freq: Three times a day (TID) | INTRAVENOUS | Status: DC
  Administered 2017-01-07: 750 mg via INTRAVENOUS
  Filled 2017-01-06 (×2): qty 150

## 2017-01-06 MED ORDER — VANCOMYCIN HCL IN DEXTROSE 1-5 GM/200ML-% IV SOLN
1000.0000 mg | INTRAVENOUS | Status: AC
  Administered 2017-01-06: 1000 mg via INTRAVENOUS
  Filled 2017-01-06: qty 200

## 2017-01-06 NOTE — Progress Notes (Signed)
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 The patient went to see her rheumatologist on 01/02/2017 at which time, her CellCept was increased to 1000 mg twice a day and prednisone decreased to 15 mg daily. The patient states that her shortness of breath is a little worse than usual in the past week, and she has a worsening cough with clear to white sputum. She normally is on home oxygen 2 L at rest, 4 L with activity. She has had some subjective chills. She denies any diarrhea, hematochezia, melena, dysuria, hematuria. She complains of some epigastric abdominal pain. She denies any worsening edema or orthopnea. She does not weigh herself. She states that her leg weakness and muscle pain is the same as usual. In the emergency department, the patient was noted to be hypotensive with blood pressure 77/32 with low-grade temperature 99.21F. The patient was fluid resuscitated and started on cefepime.  Assessment/Plan: Sepsis -Source is likely urine -Continue cefepime pending culture data-->d/c cefepime -start vancomycin 3/20 -Lactic acid peaked at 0.97 -Judicious IV fluids -Stress dosing steroids  Pyelonephritis -UA with TNTC WBC -Suspect pyelonephritis clinically based upon renal system prominence and back pain (improving) and N/V -preliminary urine culture--Enterococcus -start vancomycin  Pulmonary infiltrates -Chest x-ray shows increased bilateral interstitial and airspace opacities -Viral respiratory panel--neg -Procalcitonin--0.46 -BNP--54.4 -discontinue  cefepime -CT chest without contrast--diffuse reticulonodular interstitial coarsening with basilar groundglass opacities similar to 02/22/2016  Intractable nausea and vomiting -Vomiting  Improved-->tolerating diet  -related to UTI/pyelonephritis -Normal LFTs and lipase -01/04/2017 CT abdomen and pelvis--chronic reticular nodular changes at the lung bases. No acute findings -Clear liquid diet-->soft diet -Judicious IV fluids  Chronic respiratory failure with hypoxia -Presently stable on 2 L nasal cannula -At home--2 L at rest, 4 L with activity -Flutter valve -pt has hx of ILD, follows Dr. Jetty Duhamel  Diabetes mellitus type 2 -Check Hemoglobin A1c--6.8 -Reduced dose Lantus--continue -NovoLog sliding scale  Nonischemic cardiomyopathy/chronic systolic heart failure -12/11/2016 echo EF 45-50%, moderate TR, mild-to-moderate MR, PASP 38 -Holding furosemide -12/12/2017 discharge weight 144 pounds -Restart bisoprolol once blood pressure improves  Anti-synthetase syndrome -CPK 3548-->895 -Continue mycophenolate -am CK     Disposition Plan:   Home in 1-2 days; already has transfer order to tele Family Communication:  No Family at bedside  Consultants:  none  Code Status:  FULL  DVT Prophylaxis:  [place] Lovenox   Procedures: As Listed in Progress Note Above  Antibiotics: Vancomycin 3/20>>> Cefepime 01/04/17>>>     Subjective: Patient denies fevers, chills, headache, chest pain, dyspnea, nausea, vomiting, diarrhea, abdominal pain, dysuria, hematuria, hematochezia, and melena.    Objective: Vitals:   01/06/17 1100 01/06/17 1200 01/06/17 1300 01/06/17 1600  BP:      Pulse: 81 78 90   Resp: (!) 37 (!) 37 (!) 31   Temp:  98.5 F (36.9 C)  97.9 F (36.6 C)  TempSrc:  Oral  Oral  SpO2: 98% 97% 94%   Weight:      Height:        Intake/Output Summary (Last 24 hours) at 01/06/17 1739 Last data filed at 01/06/17 1300  Gross per 24  hour  Intake              1450 ml  Output              700 ml  Net              750 ml   Weight change: -0.793 kg (-1 lb 12 oz) Exam:   General:  Pt is alert, follows commands appropriately, not in acute distress  HEENT: No icterus, No thrush, No neck mass, [place]/AT  Cardiovascular: RRR, S1/S2, no rubs, no gallops  Respiratory: Bibasilar crackles. No wheezing. Good air movement.  Abdomen: Soft/+BS, non tender, non distended, no guarding  Extremities: No edema, No lymphangitis, No petechiae, No rashes, no synovitis   Data Reviewed: I have personally reviewed following labs and imaging studies Basic Metabolic Panel:  Recent Labs Lab 01/04/17 1723 01/05/17 0327  NA 135 137  K 4.3 4.1  CL 105 108  CO2 22 22  GLUCOSE 205* 177*  BUN 8 8  CREATININE 0.55 0.42*  CALCIUM 8.1* 8.2*  MG  --  1.8   Liver Function Tests:  Recent Labs Lab 01/04/17 1723 01/05/17 0327  AST 39 31  ALT 18 18  ALKPHOS 50 47  BILITOT 0.9 0.7  PROT 6.8 6.8  ALBUMIN 2.6* 2.6*    Recent Labs Lab 01/04/17 1727  LIPASE 12   No results for input(s): AMMONIA in the last 168 hours. Coagulation Profile: No results for input(s): INR, PROTIME in the last 168 hours. CBC:  Recent Labs Lab 01/04/17 1723 01/05/17 0327  WBC 9.1 6.0  NEUTROABS 8.0*  --   HGB 9.7* 8.2*  HCT 31.1* 26.6*  MCV 78.3 79.6  PLT 374 307   Cardiac Enzymes:  Recent Labs Lab 01/04/17 1723 01/06/17 0356  CKTOTAL 3,548* 895*   BNP: Invalid input(s): POCBNP CBG:  Recent Labs Lab 01/05/17 1605 01/05/17 2217 01/06/17 0801 01/06/17 1211 01/06/17 1647  GLUCAP 337* 328* 115* 197* 254*   HbA1C:  Recent Labs  01/05/17 1011  HGBA1C 6.8*   Urine analysis:    Component Value Date/Time   COLORURINE AMBER (A) 01/04/2017 1836   APPEARANCEUR CLOUDY (A) 01/04/2017 1836   LABSPEC 1.018 01/04/2017 1836   PHURINE 5.0 01/04/2017 1836   GLUCOSEU 50 (A) 01/04/2017 1836   HGBUR SMALL (A) 01/04/2017 1836   HGBUR negative 05/21/2010  0912   BILIRUBINUR NEGATIVE 01/04/2017 1836   BILIRUBINUR NEG 04/14/2016 1622   KETONESUR 20 (A) 01/04/2017 1836   PROTEINUR NEGATIVE 01/04/2017 1836   UROBILINOGEN 0.2 04/14/2016 1622   UROBILINOGEN 1.0 07/08/2015 0346   NITRITE NEGATIVE 01/04/2017 1836   LEUKOCYTESUR SMALL (A) 01/04/2017 1836   Sepsis Labs: @LABRCNTIP (procalcitonin:4,lacticidven:4) ) Recent Results (from the past 240 hour(s))  Urine culture     Status: Abnormal (Preliminary result)   Collection Time: 01/04/17  6:36 PM  Result Value Ref Range Status   Specimen Description URINE, CLEAN CATCH  Final   Special Requests NONE  Final   Culture >=100,000 COLONIES/mL ENTEROBACTER SPECIES (A)  Final   Report Status PENDING  Incomplete  Blood culture (routine x 2)     Status: None (Preliminary result)   Collection Time: 01/04/17  7:06 PM  Result Value Ref Range Status   Specimen Description BLOOD LEFT AC  Final   Special Requests BOTTLES DRAWN AEROBIC AND ANAEROBIC 5CC  Final   Culture   Final    NO GROWTH 1 DAY Performed at [facility], [address], [place], [place] [address]    Report Status PENDING  Incomplete  Blood culture (routine x 2)     Status: None (Preliminary result)   Collection Time: 01/04/17  7:11 PM  Result Value Ref Range Status   Specimen Description BLOOD LEFT WRIST  Final   Special Requests BOTTLES DRAWN AEROBIC AND ANAEROBIC 5CC  Final   Culture   Final    NO GROWTH 1 DAY Performed at [facility], [address] [place], [place] [address]    Report Status PENDING  Incomplete  MRSA PCR Screening     Status: None   Collection Time: 01/04/17 11:05 PM  Result Value Ref Range Status   MRSA by PCR NEGATIVE NEGATIVE Final    Comment:        The GeneXpert MRSA Assay (FDA approved for NASAL specimens only), is one component of a comprehensive MRSA colonization surveillance program. It is not intended to diagnose MRSA infection nor to guide or monitor treatment for MRSA  infections.   Respiratory Panel by PCR     Status: None   Collection Time: 01/05/17 11:00 AM  Result Value Ref Range Status   Adenovirus NOT DETECTED NOT DETECTED Final   Coronavirus 229E NOT DETECTED NOT DETECTED Final   Coronavirus HKU1 NOT DETECTED NOT DETECTED Final   Coronavirus NL63 NOT DETECTED NOT DETECTED Final   Coronavirus OC43 NOT DETECTED NOT DETECTED Final   Metapneumovirus NOT DETECTED NOT DETECTED Final   Rhinovirus / Enterovirus NOT DETECTED NOT DETECTED Final   Influenza A NOT DETECTED NOT DETECTED Final   Influenza B NOT DETECTED NOT DETECTED Final   Parainfluenza Virus 1 NOT DETECTED NOT DETECTED Final   Parainfluenza Virus 2 NOT DETECTED NOT DETECTED Final   Parainfluenza Virus 3 NOT DETECTED NOT DETECTED Final   Parainfluenza Virus 4 NOT DETECTED NOT DETECTED Final   Respiratory Syncytial Virus NOT DETECTED NOT DETECTED Final   Bordetella pertussis NOT DETECTED NOT DETECTED Final   Chlamydophila pneumoniae NOT DETECTED NOT DETECTED Final   Mycoplasma pneumoniae NOT DETECTED NOT DETECTED Final    Comment: Performed at [facility], [address], [place], [place] [address]     Scheduled Meds: . enoxaparin (LOVENOX) injection  40 mg Subcutaneous QHS  . insulin aspart  0-5 Units Subcutaneous QHS  . insulin aspart  0-9 Units Subcutaneous TID WC  . insulin glargine  20 Units Subcutaneous q morning - 10a  . methylPREDNISolone (SOLU-MEDROL) injection  40 mg Intravenous Daily  . mycophenolate  1,000 mg Oral BID  . pantoprazole  40 mg Oral Daily  . [START ON 01/07/2017] vancomycin  750 mg Intravenous Q8H   Continuous Infusions: . 0.9 % NaCl with KCl 20 mEq / L 75 mL/hr at 01/06/17 1300    Procedures/Studies: Dg Chest 2 View  Result Date: 01/04/2017 CLINICAL DATA:  Cough for 2 weeks. EXAM: CHEST  2 VIEW COMPARISON:  12/09/2016 FINDINGS: Pacemaker remains in place with unchanged appearance of right atrium, right ventricle, and coronary sinus leads. The  cardiac silhouette remains mildly enlarged. There is increased pulmonary vascular congestion with diffuse interstitial and patchy airspace opacities bilaterally. No pleural effusion or pneumothorax is identified. No acute osseous abnormality is seen. Gaseous distension of bowel loops in the upper abdomen is incompletely evaluated. IMPRESSION: Increasing bilateral lung opacities concerning for edema. Electronically Signed   By: Sebastian Ache M.D.   On: 01/04/2017 18:05   Dg Chest 2 View  Result Date: 12/09/2016 CLINICAL DATA:  Dyspnea.  Midline chest pain.  Congenital AV block. EXAM: CHEST  2 VIEW COMPARISON:  11/24/2016. FINDINGS: The heart is enlarged. Pacemaker leads are attached the RIGHT atrium, RIGHT ventricle, and coronary sinus. Interstitial prominence bilaterally is redemonstrated, slightly worse, likely edema. No appreciable airspace consolidation or pleural effusion. IMPRESSION: Slight worsening aeration.  Correlate clinically for CHF. Electronically Signed   By: Elsie Stain M.D.   On: 12/09/2016 15:48   Ct Chest Wo Contrast  Result Date: 01/05/2017 CLINICAL DATA:  Worsening in shortness of breath and cough. EXAM: CT CHEST WITHOUT CONTRAST TECHNIQUE: Multidetector CT imaging of the chest was performed following the standard protocol without IV contrast. COMPARISON:  CT chest examinations dating back to 05/26/2014. FINDINGS: Cardiovascular: Heart is enlarged.  No pericardial effusion. Mediastinum/Nodes: Low internal jugular lymph nodes measure up to 9 mm on the right, similar. Mediastinal lymph nodes measure up to 1.4 cm in the high right paratracheal station, stable. Hilar regions cannot be evaluated without IV contrast. No axillary adenopathy. Esophagus is grossly unremarkable. Lungs/Pleura: Image quality is degraded by respiratory motion. Fairly diffuse reticulonodular interstitial coarsening with basilar ground-glass, similar to 02/22/2016. 6 mm nodule in the inferior left lower lobe is  unchanged from 12/10/2015. No pleural fluid. Airway is unremarkable. Upper Abdomen: Visualized portions of the liver, adrenal glands and right kidney are unremarkable. There is parenchymal calcification and possible stone on the left. Visualized portions of the spleen, pancreas, stomach and bowel are grossly unremarkable. Musculoskeletal: No worrisome lytic or sclerotic lesions. IMPRESSION: 1. Pulmonary parenchymal pattern of reticulonodular coarsening and basilar predominant ground-glass is stable from 12/10/2015 and may represent post infectious/postinflammatory scarring. Sarcoid is not excluded. 2. Borderline enlarged mediastinal lymph nodes, similar. 3. 6 mm left lower lobe nodule, stable for 1 year. Additional follow-up CT chest without contrast could be performed in 1 year, as clinically indicated. This recommendation follows the consensus statement: Guidelines for Management of Small Pulmonary Nodules Detected on CT Images: From the Fleischner Society 2017; Radiology 2017; 284:228-243. 4. Possible left renal stone. Electronically Signed   By: Leanna Battles M.D.   On: 01/05/2017 12:28   Ct Abdomen Pelvis W Contrast  Result Date: 01/04/2017 CLINICAL DATA:  Abdominal pain and vomiting for several days. EXAM: CT ABDOMEN AND PELVIS WITH CONTRAST TECHNIQUE: Multidetector CT imaging of the abdomen and pelvis was performed using the standard protocol following bolus administration of intravenous contrast. 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 No acute focal infiltrate. The lung bases are otherwise normal. Hepatobiliary: Low-attenuation adjacent to the falciform ligament on image 22 of series 2 not significantly changed since May 2014, likely focal fatty deposition. No suspicious liver masses are  identified. The patient is status post cholecystectomy. The common bile duct is normal. Prominence of intrahepatic bile ducts is unchanged, likely due to previous cholecystectomy. The portal vein is patent. Pancreas: Unremarkable. No pancreatic ductal dilatation or surrounding inflammatory changes. Spleen: Normal in size without focal abnormality. Adrenals/Urinary Tract: Mild prominence of the bilateral renal collecting systems is stable, possibly from previous obstruction or reflux. No acute hydronephrosis identified. No stones or suspicious masses. No ureterectasis or ureteral stones. The bladder is normal. Stomach/Bowel: The stomach is normal in appearance. The small bowel is normal as well. The colon and appendix are unremarkable. Vascular/Lymphatic: Mild atherosclerotic change in the right common  iliac artery. No other atherosclerotic change identified. The abdominal aorta is normal in caliber. Branching vessels are unremarkable. No adenopathy. Reproductive: Uterus and bilateral adnexa are unremarkable. Other: No free air or free fluid. Musculoskeletal: Mild sclerosis in the femoral heads suggest mild AVN. Loss of height in the central T11 vertebral body is stable. No acute bony abnormalities. IMPRESSION: 1. No cause for acute abdominal pain identified. 2. 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 3. Minimal atherosclerotic change in the right common iliac artery. 4. 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 Electronically Signed   By: Gerome Sam III M.D   On: 01/04/2017 20:00    Beila Purdie, DO  [facility] Pager [phone]  If 7PM-7AM, please contact night-coverage [email or web address] Password [email or web address] 01/06/2017, 5:39 PM   LOS: 2 days

## 2017-01-06 NOTE — Consult Note (Signed)
   [facility] Inpatient Consult   01/06/2017  DIEGO DELANCEY 1980/12/29 [id number]    Ms. Cirilo recently active with [facility] Care Management program. Please see chart review then notes for patient outreach details. Currently on stepdown/ICU unit. Will follow up at bedside at later time. Will make inpatient RNCM aware that [facility] Care Management is active.    Raiford Noble, MSN-Ed, RN,BSN [facility] Liaison [phone]

## 2017-01-06 NOTE — Progress Notes (Signed)
Pharmacy Antibiotic Note  Makayla Vasquez is a 36 y.o. female admitted on 01/04/2017 with N/V and abdominal pain. Urinalysis is positive. Pharmacy is consulted for Cefepime dosing for UTI. Patient is immunocompromised d/t Lupus and Cellcept/prednisone therapy. During recent admission she was treated for UTI with Rocephin and transitioned to Ceftin for discharge. On 3/20 Urine culture = + enterobacter species (sensitivities still pending).  Pharmacy has been consulted to dose Vancomycin.    Plan: Cefepime has been d/c'ed Vancomycin 1gm IV x 1 dose followed by 750mg  IV q8h Follow up renal fxn, culture results, and clinical course.   Height: 5\' 5"  (165.1 cm) Weight: 149 lb 4 oz (67.7 kg) IBW/kg (Calculated) : 57  Temp (24hrs), Avg:98 F (36.7 C), Min:97.5 F (36.4 C), Max:98.5 F (36.9 C)   Recent Labs Lab 01/04/17 1723 01/04/17 1734 01/04/17 2003 01/05/17 0327  WBC 9.1  --   --  6.0  CREATININE 0.55  --   --  0.42*  LATICACIDVEN  --  0.97 0.84  --     Estimated Creatinine Clearance: 87.5 mL/min (A) (by C-G formula based on SCr of 0.42 mg/dL (L)).    Allergies  Allergen Reactions  . 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 Sertraline Hcl Hives  . Tape Other (See Comments)    Burns skin    Antimicrobials this admission: Cefepime 3/18 >> 3/20 Vanc 3/20 >>   Dose adjustments this admission:   Microbiology results: UCx: + Enterobacter species BCx: sent MRSA PCR = negative  Thank you for allowing pharmacy to be a part of this patient's care.  4/20, PharmD  01/06/2017,3:13 PM.

## 2017-01-06 NOTE — Progress Notes (Signed)
Patient transferred from ICU. VSS, tele monitor applied. Denies any needs at this time. Agree with previous RN assessment. Will continue to monitor.  

## 2017-01-07 DIAGNOSIS — A4159 Other Gram-negative sepsis: Secondary | ICD-10-CM

## 2017-01-07 DIAGNOSIS — D638 Anemia in other chronic diseases classified elsewhere: Secondary | ICD-10-CM

## 2017-01-07 LAB — BASIC METABOLIC PANEL
Anion gap: 7 (ref 5–15)
BUN: 11 mg/dL (ref 6–20)
CALCIUM: 8.8 mg/dL — AB (ref 8.9–10.3)
CO2: 25 mmol/L (ref 22–32)
Chloride: 108 mmol/L (ref 101–111)
Creatinine, Ser: 0.6 mg/dL (ref 0.44–1.00)
GFR calc Af Amer: >60 mL/min (ref >60)
GFR calc non Af Amer: >60 mL/min (ref >60)
GLUCOSE: 84 mg/dL (ref 65–99)
Potassium: 3.3 mmol/L — ABNORMAL LOW (ref 3.5–5.1)
Sodium: 140 mmol/L (ref 135–145)

## 2017-01-07 LAB — CBC
HCT: 28.7 % — ABNORMAL LOW (ref 36.0–46.0)
HEMOGLOBIN: 8.7 g/dL — AB (ref 12.0–15.0)
MCH: 24.4 pg — AB (ref 26.0–34.0)
MCHC: 30.3 g/dL (ref 30.0–36.0)
MCV: 80.4 fL (ref 78.0–100.0)
PLATELETS: 399 10*3/uL (ref 150–400)
RBC: 3.57 MIL/uL — AB (ref 3.87–5.11)
RDW: 17.7 % — ABNORMAL HIGH (ref 11.5–15.5)
WBC: 7 10*3/uL (ref 4.0–10.5)

## 2017-01-07 LAB — GLUCOSE, CAPILLARY
GLUCOSE-CAPILLARY: 73 mg/dL (ref 65–99)
GLUCOSE-CAPILLARY: 159 mg/dL — AB (ref 65–99)

## 2017-01-07 LAB — PROCALCITONIN: Procalcitonin: 0.11 ng/mL

## 2017-01-07 LAB — CK: CK TOTAL: 726 U/L — AB (ref 38–234)

## 2017-01-07 LAB — URINE CULTURE

## 2017-01-07 MED ORDER — INSULIN GLARGINE 100 UNIT/ML ~~LOC~~ SOLN
18.0000 [IU] | Freq: Every morning | SUBCUTANEOUS | Status: DC
  Administered 2017-01-07: 18 [IU] via SUBCUTANEOUS
  Filled 2017-01-07: qty 0.18

## 2017-01-07 MED ORDER — DEXTROSE 5 % IV SOLN
2.0000 g | Freq: Two times a day (BID) | INTRAVENOUS | Status: DC
  Filled 2017-01-07: qty 2

## 2017-01-07 MED ORDER — SULFAMETHOXAZOLE-TRIMETHOPRIM 800-160 MG PO TABS
1.0000 | ORAL_TABLET | Freq: Two times a day (BID) | ORAL | Status: DC
  Administered 2017-01-07: 1 via ORAL
  Filled 2017-01-07: qty 1

## 2017-01-07 MED ORDER — INSULIN GLARGINE 100 UNIT/ML SOLOSTAR PEN: 25.0000 [IU] | PEN_INJECTOR | Freq: Every day | SUBCUTANEOUS | 5 refills | Status: DC

## 2017-01-07 MED ORDER — SULFAMETHOXAZOLE-TRIMETHOPRIM 800-160 MG PO TABS: 1.0000 | ORAL_TABLET | Freq: Two times a day (BID) | ORAL | 0 refills | Status: DC

## 2017-01-07 NOTE — Care Management Note (Signed)
Case Management Note  Patient Details  Name: Makayla Vasquez MRN: [id number] Date of Birth: 1981-07-11  Subjective/Objective:  36 y/o f admitted w/n/v. From home. No CM needs.                  Action/Plan:d/c home.   Expected Discharge Date:  01/07/17               Expected Discharge Plan:  Home/Self Care  In-House Referral:     Discharge planning Services     Post Acute Care Choice:    Choice offered to:     DME Arranged:    DME Agency:     HH Arranged:    HH Agency:     Status of Service:  Completed, signed off  If discussed at [employer] of Stay Meetings, dates discussed:    Additional Comments:  Lanier Clam, RN 01/07/2017, 10:30 AM

## 2017-01-07 NOTE — Progress Notes (Signed)
Inpatient Diabetes Program Recommendations  AACE/ADA: New Consensus Statement on Inpatient Glycemic Control (2015)  Target Ranges:  Prepandial:   less than 140 mg/dL      Peak postprandial:   less than 180 mg/dL (1-2 hours)      Critically ill patients:  140 - 180 mg/dL   Results for AZIAH, KAISER (MRN [id number]) as of 01/07/2017 09:10  Ref. Range 01/06/2017 08:01 01/06/2017 12:11 01/06/2017 16:47 01/06/2017 19:44  Glucose-Capillary Latest Ref Range: 65 - 99 mg/dL 732 (H)  0 units Novolog 197 (H)  2 units Novolog 254 (H)  5 units Novolog 149 (H)   Results for LATROYA, NG (MRN [id number]) as of 01/07/2017 09:10  Ref. Range 01/07/2017 08:00  Glucose-Capillary Latest Ref Range: 65 - 99 mg/dL 73    Home DM Meds: Lantus 35 units daily       Novolog 8 units TID  Current Insulin Orders: Lantus 20 units daily      Novolog Sensitive Correction Scale/ SSI (0-9 units) TID AC + HS          MD- Note AM CBG down to 73 mg/dl.  Per MD notes from yesterday, pt now tolerating solid PO diet.    Please consider the following recs:  1. Decrease Lantus slightly to 18 units daily  2. Start low dose Novolog Meal Coverage: Novolog 3 units TID with meals (hold if pt eats <50% of meal)      --Will follow patient during hospitalization--  Ambrose Finland RN, MSN, CDE Diabetes Coordinator Inpatient Glycemic Control Team Team Pager: [phone] (8a-5p)

## 2017-01-07 NOTE — Discharge Summary (Signed)
Discharge Summary  Makayla Vasquez KDX:[id number] DOB: 1981-02-28  PCP: Adin Hector, MD  Admit date: 01/04/2017 Discharge date: 01/07/2017  Time spent: 25 minutes   Recommendations for Outpatient Follow-up:  1. Patient will follow-up with her PCP in a few weeks. 2. New medication: Bactrim DS 1 by mouth twice a day 5 days  3. Medication change: Patient's appetite not get full strength. Have advised her decrease Lantus to 25 units daily at bedtime and trend back up to original dose once her appetite is back to baseline.  Discharge Diagnoses:  Active Hospital Problems   Diagnosis Date Noted  . Sepsis due to Enterococcus ([place]) 01/06/2017  . Chronic respiratory failure ([place])   . Sepsis due to undetermined organism ([place]) 01/05/2017  . Nausea & vomiting 01/04/2017  . UTI (urinary tract infection) 12/09/2016  . SIRS (systemic inflammatory response syndrome) ([place]) 02/22/2016  . Anemia of chronic disease   . Adrenal insufficiency ([place])   . 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 Diabetes mellitus type 2 in obese ([place])   . Chronic respiratory failure with hypoxia ([place]) 08/07/2014  . Congestive dilated cardiomyopathy ([place]) 05/31/2014  . Chronic systolic heart failure ([place]) 02/26/2012  . Congenital complete AV block     Resolved Hospital Problems   Diagnosis Date Noted Date Resolved  No resolved problems to display.    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 The patient states that her shortness of breath is a little worse than usual in the past week, and she has a worsening cough with clear to white sputum. She normally is on home oxygen 2 L at rest, 4 L with activity. She has had some subjective chills. She denies any diarrhea, hematochezia, melena, dysuria, hematuria. She complains of some epigastric abdominal pain. She denies any worsening edema or orthopnea. She does not weigh herself. She states that her leg weakness and muscle pain is the same as usual. In the emergency department, the patient was noted to be hypotensive with blood pressure 77/32with low-grade temperature 99.77F. The patient was fluid resuscitated and started on cefepime  Hospital Course:  Active Problems:   Congenital complete AV block   Congestive dilated cardiomyopathy/Chronic systolic heart failure [facility]): Echocardiogram done February 2018 noted EF of 45-50 percent with moderate TR and mild to moderate MR. Restarted the Zocor on discharge not a blood pressure is more stable.    Diabetes mellitus type 2 in obese [facility]): Stable. A1c at 6.8 noting excellent control. With initial nausea and vomiting, Lantus normally at 35 units was decreased down to 18 units. Her appetite is slowly improving although not back yet at baseline. Advised her on discharge to decrease Lantus down to 25 units and then increased back up a sugars trended up and her appetite resume back to normal.    Antisynthetase  syndrome [facility]): CPK 3548--> 726 on day of discharge. -Continue mycophenolate    Adrenal insufficiency (HCC)   Anemia of chronic disease: Stable.    Nausea & vomiting: Felt to be secondary sepsis. Patient's diet advanced which she tolerated well. IV fluids for hydration.    Sepsis  due to Enterobacter [facility]), with urinary source: Patient met criteria given tachycardia, tachypnea, urinary source, white blood cell count. Initially placed on broad-spectrum antibiotics. Blood cultures no growth to date but urine cultures came back for Enterobacter. Sensitivities back on 3/21 and patient changed over to by mouth Bactrim. She tolerated this well and felt to be stable.    Chronic respiratory failure (HCC) with hypoxia: Patient normally on 2 L nasal cannula at rest, 4 L with activity. Oxygen saturations stayed stable at this rate during hospitalization. Pulmonary infiltrates noted on chest x-ray, viral respiratory panel negative. Pro-calcitonin initially elevated at 0.46, but quickly came down. BNP normal. CT of the chest without contrast noted interstitial coarsening but opacities similar to CT done in May 2017.   Procedures:  None   Consultations:  None  Discharge Exam: BP (!) 103/58 (BP Location: Right Arm)   Pulse 89   Temp 98.3 F (36.8 C) (Oral)   Resp 18   Ht 5' 5"  (1.651 m)   Wt 65.3 kg (143 lb 15.4 oz)   LMP 12/09/2016 Comment: negative HCG blood test 01-04-2017  SpO2 95%   BMI 23.96 kg/m   General:  Alert and oriented 3, no acute distress Cardiovascular: regular rate and rhythm, T5-T7, 2/6 systolic ejection murmur  Respiratory: decreased breath sounds throughout   Discharge Instructions You were cared for by a hospitalist during your hospital stay. If you have any questions about your discharge medications or the care you received while you were in the hospital after you are discharged, you can call the unit and asked to speak with the hospitalist on call if the hospitalist that took care of you is not available. Once you are discharged, your primary care physician will handle any further medical issues. Please note that NO REFILLS for any discharge medications will be authorized once you are discharged, as it is imperative that you return to your primary care  physician (or establish a relationship with a primary care physician if you do not have one) for your aftercare needs so that they can reassess your need for medications and monitor your lab values.  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   fluocinonide 0.05 % external solution Commonly known as:  LIDEX 1 APPLICATION APPLY ON THE SKIN AS DIRECTED APPLY TO AFFECTED AREAS DAILY ON SCALP AS NEEDED FOR ITCHING.   furosemide 20 MG tablet Commonly known as:  LASIX Take 1 tablet (20 mg total) by mouth every other day.  insulin aspart 100 UNIT/ML FlexPen Commonly known as:  NOVOLOG Inject 8 Units into the skin 3 (three) times daily with meals.   Insulin Glargine 100 UNIT/ML Solostar Pen Commonly known as:  LANTUS SOLOSTAR Inject 25 Units into the skin daily. What changed:  how much to take   ipratropium 0.06 % nasal spray Commonly known as:  ATROVENT Place 2 sprays into both nostrils 4 (four) times daily.   losartan 25 MG tablet Commonly known as:  COZAAR Take 0.5 tablets (12.5 mg total) by mouth every evening. What changed:  when to take this   mycophenolate 500 MG tablet Commonly known as:  CELLCEPT Take 1,000 mg by mouth 2 (two) times daily.   pantoprazole 40 MG tablet Commonly known as:  PROTONIX Take 1 tablet (40 mg total) by mouth daily.   predniSONE 5 MG tablet Commonly known as:  DELTASONE Take 15 mg by mouth daily  with breakfast.   sulfamethoxazole-trimethoprim 800-160 MG tablet Commonly known as:  BACTRIM DS,SEPTRA DS Take 1 tablet by mouth 2 (two) times daily.   Vitamin D (Ergocalciferol) 50000 units Caps capsule Commonly known as:  DRISDOL Take 50,000 Units by mouth once a week.      Allergies  Allergen Reactions  . 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 Sertraline Hcl Hives  . Tape Other (See Comments)    Burns skin      The results of significant diagnostics from this hospitalization (including imaging, microbiology, ancillary and laboratory) are listed below for reference.    Significant Diagnostic Studies: Dg Chest 2 View  Result Date: 01/04/2017 CLINICAL DATA:  Cough for 2 weeks. EXAM: CHEST  2 VIEW COMPARISON:  12/09/2016 FINDINGS: Pacemaker remains in place with unchanged appearance of right atrium, right ventricle, and coronary sinus leads. The cardiac silhouette remains mildly enlarged. There is increased pulmonary vascular congestion with diffuse interstitial and patchy airspace opacities bilaterally. No pleural effusion or pneumothorax is identified. No acute osseous abnormality is seen. Gaseous distension of bowel loops in the upper abdomen is incompletely evaluated. IMPRESSION: Increasing bilateral lung opacities concerning for edema. Electronically Signed   By: Logan Bores M.D.   On: 01/04/2017 18:05   Dg Chest 2 View  Result Date: 12/09/2016 CLINICAL DATA:  Dyspnea.  Midline chest pain.  Congenital AV block. EXAM: CHEST  2 VIEW COMPARISON:  11/24/2016. FINDINGS: The heart is enlarged. Pacemaker leads are attached the RIGHT atrium, RIGHT ventricle, and coronary sinus. Interstitial prominence bilaterally is redemonstrated, slightly worse, likely edema. No appreciable airspace consolidation or pleural effusion. IMPRESSION: Slight worsening aeration.  Correlate clinically for CHF. Electronically Signed   By:  Staci Righter M.D.   On: 12/09/2016 15:48   Ct Chest Wo Contrast  Result Date: 01/05/2017 CLINICAL DATA:  Worsening in shortness of breath and cough. EXAM: CT CHEST WITHOUT CONTRAST TECHNIQUE: Multidetector CT imaging of the chest was performed following the standard protocol without IV contrast. COMPARISON:  CT chest examinations dating back to 05/26/2014. FINDINGS: Cardiovascular: Heart is enlarged.  No pericardial effusion. Mediastinum/Nodes: Low internal jugular lymph nodes measure up to 9 mm on the right, similar. Mediastinal lymph nodes measure up to 1.4 cm in the high right paratracheal station, stable. Hilar regions cannot be evaluated without IV contrast. No axillary adenopathy. Esophagus is grossly unremarkable. Lungs/Pleura: Image quality is degraded by respiratory motion. Fairly diffuse reticulonodular interstitial coarsening with basilar ground-glass, similar to 02/22/2016. 6 mm nodule in the  inferior left lower lobe is unchanged from 12/10/2015. No pleural fluid. Airway is unremarkable. Upper Abdomen: Visualized portions of the liver, adrenal glands and right kidney are unremarkable. There is parenchymal calcification and possible stone on the left. Visualized portions of the spleen, pancreas, stomach and bowel are grossly unremarkable. Musculoskeletal: No worrisome lytic or sclerotic lesions. IMPRESSION: 1. Pulmonary parenchymal pattern of reticulonodular coarsening and basilar predominant ground-glass is stable from 12/10/2015 and may represent post infectious/postinflammatory scarring. Sarcoid is not excluded. 2. Borderline enlarged mediastinal lymph nodes, similar. 3. 6 mm left lower lobe nodule, stable for 1 year. Additional follow-up CT chest without contrast could be performed in 1 year, as clinically indicated. This recommendation follows the consensus statement: Guidelines for Management of Small Pulmonary Nodules Detected on CT Images: From the Fleischner Society 2017; Radiology 2017;  [id number]. 4. Possible left renal stone. Electronically Signed   By: Lorin Picket M.D.   On: 01/05/2017 12:28   Ct Abdomen Pelvis W Contrast  Result Date: 01/04/2017 CLINICAL DATA:  Abdominal pain and vomiting for several days. EXAM: CT ABDOMEN AND PELVIS WITH CONTRAST TECHNIQUE: Multidetector CT imaging of the abdomen and pelvis was performed using the standard protocol following bolus administration of intravenous contrast. 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 No acute focal infiltrate. The lung bases are otherwise normal. Hepatobiliary: Low-attenuation adjacent to the falciform ligament on image 22 of series 2 not significantly changed since May 2014, likely focal fatty deposition. No suspicious liver masses are identified. The patient is status post cholecystectomy. The common bile duct is normal. Prominence of intrahepatic bile ducts is unchanged, likely due to previous cholecystectomy. The portal vein is patent. Pancreas: Unremarkable. No pancreatic ductal dilatation or surrounding inflammatory changes. Spleen: Normal in size without focal abnormality. Adrenals/Urinary Tract: Mild prominence of the bilateral renal collecting systems is stable, possibly from previous obstruction or reflux. No acute hydronephrosis identified. No stones or suspicious masses. No ureterectasis or ureteral stones. The bladder is normal. Stomach/Bowel: The stomach is normal in appearance. The small bowel is normal as well. The colon and appendix are unremarkable. Vascular/Lymphatic: Mild atherosclerotic change in the right common iliac artery. No other atherosclerotic change identified. The abdominal aorta is normal in caliber. Branching vessels are unremarkable. No adenopathy. Reproductive: Uterus and  bilateral adnexa are unremarkable. Other: No free air or free fluid. Musculoskeletal: Mild sclerosis in the femoral heads suggest mild AVN. Loss of height in the central T11 vertebral body is stable. No acute bony abnormalities. IMPRESSION: 1. No cause for acute abdominal pain identified. 2. 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 3. Minimal atherosclerotic change in the right common iliac artery. 4. 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 Electronically Signed   By: Dorise Bullion III M.D   On: 01/04/2017 20:00    Microbiology: Recent Results (from the past 240 hour(s))  Urine culture     Status: Abnormal   Collection Time: 01/04/17  6:36 PM  Result Value Ref Range Status   Specimen Description URINE, CLEAN CATCH  Final   Special Requests NONE  Final   Culture >=100,000 COLONIES/mL ENTEROBACTER SPECIES (A)  Final   Report Status 01/07/2017 FINAL  Final   Organism ID, Bacteria ENTEROBACTER SPECIES (A)  Final      Susceptibility   Enterobacter species -  MIC*    CEFAZOLIN >=64 RESISTANT Resistant     CEFTRIAXONE >=64 RESISTANT Resistant     CIPROFLOXACIN <=0.25 SENSITIVE Sensitive     GENTAMICIN <=1 SENSITIVE Sensitive     IMIPENEM 1 SENSITIVE Sensitive     NITROFURANTOIN 64 INTERMEDIATE Intermediate     TRIMETH/SULFA <=20 SENSITIVE Sensitive     PIP/TAZO >=128 RESISTANT Resistant     * >=100,000 COLONIES/mL ENTEROBACTER SPECIES  Blood culture (routine x 2)     Status: None (Preliminary result)   Collection Time: 01/04/17  7:06 PM  Result Value Ref Range Status   Specimen Description BLOOD LEFT AC  Final   Special Requests BOTTLES DRAWN AEROBIC AND ANAEROBIC 5CC  Final   Culture   Final    NO GROWTH 2 DAYS Performed at [facility], [place] [address] [place], [place] [address]    Report Status PENDING  Incomplete  Blood culture (routine x 2)     Status: None (Preliminary result)   Collection Time: 01/04/17  7:11 PM  Result Value Ref Range Status     Specimen Description BLOOD LEFT WRIST  Final   Special Requests BOTTLES DRAWN AEROBIC AND ANAEROBIC 5CC  Final   Culture   Final    NO GROWTH 2 DAYS Performed at [facility], [place] [address], [place], [place] [address]    Report Status PENDING  Incomplete  MRSA PCR Screening     Status: None   Collection Time: 01/04/17 11:05 PM  Result Value Ref Range Status   MRSA by PCR NEGATIVE NEGATIVE Final    Comment:        The GeneXpert MRSA Assay (FDA approved for NASAL specimens only), is one component of a comprehensive MRSA colonization surveillance program. It is not intended to diagnose MRSA infection nor to guide or monitor treatment for MRSA infections.   Respiratory Panel by PCR     Status: None   Collection Time: 01/05/17 11:00 AM  Result Value Ref Range Status   Adenovirus NOT DETECTED NOT DETECTED Final   Coronavirus 229E NOT DETECTED NOT DETECTED Final   Coronavirus HKU1 NOT DETECTED NOT DETECTED Final   Coronavirus NL63 NOT DETECTED NOT DETECTED Final   Coronavirus OC43 NOT DETECTED NOT DETECTED Final   Metapneumovirus NOT DETECTED NOT DETECTED Final   Rhinovirus / Enterovirus NOT DETECTED NOT DETECTED Final   Influenza A NOT DETECTED NOT DETECTED Final   Influenza B NOT DETECTED NOT DETECTED Final   Parainfluenza Virus 1 NOT DETECTED NOT DETECTED Final   Parainfluenza Virus 2 NOT DETECTED NOT DETECTED Final   Parainfluenza Virus 3 NOT DETECTED NOT DETECTED Final   Parainfluenza Virus 4 NOT DETECTED NOT DETECTED Final   Respiratory Syncytial Virus NOT DETECTED NOT DETECTED Final   Bordetella pertussis NOT DETECTED NOT DETECTED Final   Chlamydophila pneumoniae NOT DETECTED NOT DETECTED Final   Mycoplasma pneumoniae NOT DETECTED NOT DETECTED Final    Comment: Performed at [facility], [place] [address], [place], [place] [address]     Labs: Basic Metabolic Panel:  Recent Labs Lab 01/04/17 1723 01/05/17 0327 01/07/17 0513  NA 135 137 140  K 4.3 4.1  3.3*  CL 105 108 108  CO2 22 22 25   GLUCOSE 205* 177* 84  BUN 8 8 11   CREATININE 0.55 0.42* 0.60  CALCIUM 8.1* 8.2* 8.8*  MG  --  1.8  --    Liver Function Tests:  Recent Labs Lab 01/04/17 1723 01/05/17 0327  AST 39 31  ALT 18 18  ALKPHOS 50 47  BILITOT 0.9 0.7  PROT 6.8 6.8  ALBUMIN 2.6* 2.6*    Recent Labs Lab 01/04/17 1727  LIPASE 12   No results for input(s): AMMONIA in the last 168 hours. CBC:  Recent Labs Lab 01/04/17 1723 01/05/17 0327 01/07/17 0513  WBC 9.1 6.0 7.0  NEUTROABS 8.0*  --   --   HGB 9.7* 8.2* 8.7*  HCT 31.1* 26.6* 28.7*  MCV 78.3 79.6 80.4  PLT 374 307 399   Cardiac Enzymes:  Recent Labs Lab 01/04/17 1723 01/06/17 0356 01/07/17 0513  CKTOTAL 3,548* 895* 726*   BNP: BNP (last 3 results)  Recent Labs  02/16/16 1149 12/09/16 1510 01/05/17 0327  BNP 56.9 21.9 54.4    ProBNP (last 3 results) No results for input(s): PROBNP in the last 8760 hours.  CBG:  Recent Labs Lab 01/06/17 1211 01/06/17 1647 01/06/17 1944 01/07/17 0800 01/07/17 1227  GLUCAP 197* 254* 149* 73 159*       Signed:  Annita Brod, MD [facility] 01/07/2017, 5:33 PM

## 2017-01-08 ENCOUNTER — Other Ambulatory Visit: Payer: Self-pay

## 2017-01-10 LAB — CULTURE, BLOOD (ROUTINE X 2)
CULTURE: NO GROWTH
Culture: NO GROWTH

## 2017-01-12 ENCOUNTER — Telehealth: Payer: Self-pay | Admitting: Psychology

## 2017-01-12 NOTE — Telephone Encounter (Signed)
Patient left a VM for me to call her back.  She was seen in 2010 for one visit as best I can tell.  I called her back and left a VM.

## 2017-01-13 NOTE — Patient Outreach (Signed)
[facility]) Care Management  Late entry for 01/08/2017  Makayla Vasquez 20-Nov-1980 [id number]  Successful outreach completed with patient. Patient identity verified.  RNCM provided education about [facility] services and patient was agreeable to outreach. Patient verbalized understanding of her hospitalization and stated that she has all of her medications. RNCM reviewed medications with patient and she has no questions or concerns. Patient was started on Bactrim at dc and patient verbalized she has this and has already started it.  Patient reported that she is currently wearing oxygen at 2lpm at all times. She stated that she is normally on it as needed, but since dc she is to wear it all the time. She does report that she continues to have a little shortness of breath with exertion only.    Patient stated that she is weighing herself daily, She reported her weight today was 132 lbs. (Her dc weight was 143 lbs 15.4 oz). She verbalized understanding of when to call her doctor for weight gain. She currently denies shortness of breath when sleeping and does not require extra pillows.   Patient also complains of chronic pain in bilateral legs. She currently rates pain 5/10. She reported that she was prescribed some nerve pain medicine for them at her doctor's appointment but she has not yet started taking it. She reported she was given a prescription of  Gabapentin 300 mg and Loratadine 10 mg at a follow up appointment. Patient confirms she has transportation for her appointments.   Patient has no other questions or concerns at present time. It was difficult to talk with patient during call due to a lot of noise and people talking in the background. Patient was agreeable with a home visit next week to complete assessment. RNCM provided patient with contact information and invited to call with any questions or concerns.   Plan: continue to follow for transition of care and home visit  scheduled for next week.  [place] R. Turhan Chill, RN, BSN, CCM [facility] Care Management Coordinator [phone]

## 2017-01-14 ENCOUNTER — Other Ambulatory Visit: Payer: Self-pay

## 2017-01-14 ENCOUNTER — Inpatient Hospital Stay: Payer: Medicare Other | Admitting: Internal Medicine

## 2017-01-15 NOTE — Telephone Encounter (Signed)
Patient called back.  She reported she did not receive my VM on the 26th.  Discussed reason for appointment.  She reports she has anxiety all of the time, that Xanax has been effective, and that her physician will not prescribe it for her.  I explained the role of the mood clinic in finding out more about her anxiety and identifying reasonable treatment options.  Xanax may or may not be one of those options.  I asked if she were interested in an appointment.  She reiterated that Xanax is what has worked for her.  She uses nonpharm approaches currently but it is not enough.  She elected to schedule an appointment.  First available was 03/25/17 at 9:00.  I told her the following: -  If she is unable to make the appointment she needs to call me. -  If she misses the appointment without a phone call, I won't be able to schedule her back in my clinic. She voiced an understanding and was able to repeat the appointment date and time back to me.

## 2017-01-16 ENCOUNTER — Other Ambulatory Visit: Payer: Self-pay | Admitting: *Deleted

## 2017-01-16 NOTE — Patient Outreach (Signed)
Transition of care call. Pt is doing well. She denies SOB today. She has not weighed today and I have encouraged her to do so EVERYDAY as this in her basic self monitoring action to identify fluid weight gain.  I have educated her on our [facility] team suggestion that I follow her for thr ReDS Fluid Volume Monitor vest. I told her how the vest works and asked if she has any medical devices implanted in the right chest. She reports her pacemaker is on the left. She agreed to participate. I also told her [place], RN, would still be her assigned case manager and we would be coordinating our efforts together.  I scheduled her for a home visit on Thursday, April 5th at 1:00 pm. I will advise [place] of this date and time, so that she may plan on joining [place] if her schedule allows for that.  Almetta Lovely [facility] Care Manager [phone]

## 2017-01-19 ENCOUNTER — Other Ambulatory Visit: Payer: Self-pay | Admitting: Pharmacist

## 2017-01-19 NOTE — Patient Outreach (Signed)
Makayla Vasquez was referred to pharmacy for medication managment. Per referral, patient has questions about potential medication interactions and medication adherence. Left a HIPAA compliant message on the patient's voicemail. If have not heard from patient by 01/21/17, will give her another call at that time.  Duanne Moron, PharmD, [facility] Clinical Pharmacist [facility] Care Management [phone]

## 2017-01-21 ENCOUNTER — Ambulatory Visit: Payer: Self-pay | Admitting: Pharmacist

## 2017-01-22 ENCOUNTER — Ambulatory Visit: Payer: Self-pay | Admitting: *Deleted

## 2017-01-23 ENCOUNTER — Other Ambulatory Visit: Payer: Self-pay | Admitting: Pharmacist

## 2017-01-23 ENCOUNTER — Other Ambulatory Visit: Payer: Self-pay

## 2017-01-23 DIAGNOSIS — M609 Myositis, unspecified: Secondary | ICD-10-CM | POA: Diagnosis not present

## 2017-01-23 DIAGNOSIS — Z79899 Other long term (current) drug therapy: Secondary | ICD-10-CM | POA: Diagnosis not present

## 2017-01-23 DIAGNOSIS — D8989 Other specified disorders involving the immune mechanism, not elsewhere classified: Secondary | ICD-10-CM | POA: Diagnosis not present

## 2017-01-23 NOTE — Patient Outreach (Signed)
[facility]) Care Management  01/23/17  Makayla Vasquez 1980/12/28 [id number]  Attempted to reach patient without success. Left HIPAA compliant voicemail with RNCM contact information and invited callback.  [place] R. Jovonda Selner, RN, BSN, CCM [facility] Care Management Coordinator [phone]

## 2017-01-23 NOTE — Patient Outreach (Signed)
Makayla Vasquez was referred to pharmacy for medication managment. Per referral, patient has questions about potential medication interactions and medication adherence. Called and spoke with patient. HIPAA identifiers verified and verbal consent received.  Makayla Vasquez reports that she would like to verify whether her current medications interact with either gabapentin or loratadine. Request to review with patient her current medication list. Offer to call patient back when she is at home. However, patient reports that she would like to do it now as she does not have her medications in front of her to verify dosages, but knows all of the names of all of her medications. Based on the medication list provided by the patient, note potential interaction between the patient's Atrovent nasal spray and loratadine. Let patient know about the potential interaction based on these two medications both having anticholingeric properties. Counsel patient on symptoms that she would want to watch out for if taking these two medications in combination. Patient reports that she is not currently taking the Atrovent and that she rarely takes this medication. Also counsel patient about potential sedation with gabapentin and potential contribution to sedation of taking loratadine at the same time. Patient verbalizes understanding and states that she is planning to start the gabapentin by taking it first only at bedtime.   Makayla Vasquez reports that she is currently not taking her bisoprolol as she has misplaced her bottle and is not sure when her next refill is due. Advise patient that she does not need to find the bottle in order to obtain a refill, but that she can instead call her pharmacy to request the refill/ask about purchasing just enough medication to last until refill is due. Counsel patient on the importance of medication adherence. Makayla Vasquez reports that she will call her pharmacy today.  Further counsel patient on the importance  of medication adherence and strategies. Makayla Vasquez reports that she has tried using a pillbox in the past, but that this strategy did not work for her. Discuss with patient the use of a phone alarm to help her to remember to take her medications. Patient reports that she has just started using this strategy.  Makayla Vasquez reports that she has no further medication questions/concerns at this time. Confirm that patient has my phone number.  Will close pharmacy episode at this time.  Duanne Moron, PharmD, [facility] Clinical Pharmacist [facility] Care Management [phone]

## 2017-01-24 ENCOUNTER — Other Ambulatory Visit: Payer: Self-pay | Admitting: *Deleted

## 2017-01-24 NOTE — Patient Outreach (Signed)
01/22/17 Went to pt home for initial visit and ReDS Fluid Volume Monitoring vest reading. Pt was not at home. I left a [facility] introductory folder and my card with her niece. I will reach out again next week to see if we can reschedule with her primary care manager, Nigel Mormon, RN.  Almetta [facility] Care Manager [phone]

## 2017-01-28 ENCOUNTER — Other Ambulatory Visit: Payer: Self-pay

## 2017-01-28 NOTE — Patient Outreach (Signed)
[facility]) Care Management  01/28/17  CINTHYA BORS 04/23/1981 [id number]  Attempted to reach patient without success. Left HIPAA compliant voicemail with RNCM contact information and invited a callback.  Turkey R. Maely Clements, RN, BSN, CCM [facility] Care Management Coordinator [phone]

## 2017-01-29 ENCOUNTER — Encounter: Payer: Self-pay | Admitting: Internal Medicine

## 2017-01-29 ENCOUNTER — Ambulatory Visit (INDEPENDENT_AMBULATORY_CARE_PROVIDER_SITE_OTHER): Payer: Medicare Other | Admitting: Internal Medicine

## 2017-01-29 DIAGNOSIS — E1165 Type 2 diabetes mellitus with hyperglycemia: Secondary | ICD-10-CM

## 2017-01-29 DIAGNOSIS — Z794 Long term (current) use of insulin: Secondary | ICD-10-CM | POA: Diagnosis not present

## 2017-01-29 DIAGNOSIS — N390 Urinary tract infection, site not specified: Secondary | ICD-10-CM

## 2017-01-29 NOTE — Progress Notes (Signed)
   Subjective:   Patient: Makayla Vasquez       Birthdate: Dec 20, 1980       MRN: [id number]      HPI  Makayla Vasquez is a 36 y.o. female presenting for hospital follow-up.   Patient admitted from 3/18-3/21. She presented to ED with nausea and vomiting, and was found to have hypotension to 77/32. She was subsequently admitted for fluid resuscitation and sepsis work-up, as she also had tachycardia, tachypnea, elevated WBC, and possible urinary infectious source. She was started on IV antibiotics. Urine cultures positive for Enterobacter, so patient was transitioned to Bactrim. Blood cultures with no growth. When symptoms and vital signs had improved, she was discharged with outpatient follow-up. She was also advised to decrease insulin from 35U to 25U until her appetite returned to usual.   Since discharge, patient says she has been feeling well. She completed course of Bactrim as prescribed. She is still only taking 25U insulin, but says her blood sugars have been well-controlled. Does report hyperglycemia when she goes on "food binges" and "eats what I'm not supposed to eat." She is back to using supplemental O2 intermittently, and is on 2L. She says that she knows when to put her oxygen back on because she begins coughing. She only wears oxygen sometimes at night, and when she does wear it, says that the cannula has often fallen off during the night when she wakes up in the morning. [facility] is coming to her house every other week. Still has home health aid at house. She picked up all medication refills yesterday and is now taking all prescribed medications.   Patient is reporting runny nose that she attributes to allergies. She denies fevers, sore throat. Endorses cough but this is not new. Has not tried any medications for this, including the loratadine and Atrovent she is prescribed.   Smoking status reviewed. Patient is former smoker.   Review of Systems See HPI.     Objective:  Physical Exam    Constitutional: She is oriented to person, place, and time and well-developed, well-nourished, and in no distress.  HENT:  Head: Normocephalic and atraumatic.  Mouth/Throat: Oropharynx is clear and moist. No oropharyngeal exudate.  Clear nasal discharge present in nares  Eyes: Conjunctivae and EOM are normal. Pupils are equal, round, and reactive to light. Right eye exhibits no discharge. Left eye exhibits no discharge.  Cardiovascular: Normal rate, regular rhythm and normal heart sounds.   No murmur heard. Pulmonary/Chest: Effort normal and breath sounds normal. No respiratory distress. She has no wheezes.  Neurological: She is alert and oriented to person, place, and time.  Skin: Skin is warm and dry.  Psychiatric: Affect and judgment normal.      Assessment & Plan:  UTI (urinary tract infection) Doing well since discharge on 3/21. Completed 5d course of Bactrim.   Type 2 diabetes mellitus with hyperglycemia, with long-term current use of insulin (HCC) Still taking Lantus 25U as she was on hospital discharged. Did not increase to pre-admission dose of 35U. As A1C during admission was 6.8 and patient reporting mostly at goal blood sugars, can continue on 25U for now. Will consider increasing back to 35U if A1C elevated at next visit. Continues Novolog as previously prescribed.  - F/u in three months. Repeat A1C at that time.    Tarri Abernethy, MD, MPH PGY-2 [facility] Pager [phone]

## 2017-01-29 NOTE — Assessment & Plan Note (Signed)
Doing well since discharge on 3/21. Completed 5d course of Bactrim.

## 2017-01-29 NOTE — Assessment & Plan Note (Signed)
Still taking Lantus 25U as she was on hospital discharged. Did not increase to pre-admission dose of 35U. As A1C during admission was 6.8 and patient reporting mostly at goal blood sugars, can continue on 25U for now. Will consider increasing back to 35U if A1C elevated at next visit. Continues Novolog as previously prescribed.  - F/u in three months. Repeat A1C at that time.

## 2017-01-29 NOTE — Patient Instructions (Addendum)
It was nice seeing you again today Ms. Stricker!  To help with your runny nose, you can use either the Afrin nasal spray, or the Claritin (loratadine). Claritin may make you a little sleepy. Use either medication as directed on the packaging.   I will see you back in three months, or sooner if you have any concerns.   If you have any questions or concerns, please feel free to call the clinic.   Be well,  Dr. Natale Milch

## 2017-02-06 ENCOUNTER — Other Ambulatory Visit: Payer: Self-pay

## 2017-02-06 NOTE — Patient Outreach (Signed)
[facility]) Care Management  02/06/17  Makayla Vasquez 05-16-1981 [id number]  Successful outreach completed with patient. Patient identification verified.  Patient stated that she has generally been doing ok. She stated that she has been having diarrhea since last night but no other complaints.  Patient is continuing to weigh herself. Today's weight was 146 lbs. She denies any swelling or shortness of breath.   Patient stated she has been doing well and has no questions or concerns at present. Encouraged to call with any needs and patient verbalized understanding.  Plan: Will follow up in next 1-2 weeks telephonically and schedule a follow up home visit at that time.  Turkey R. Dinna Severs, RN, BSN, CCM [facility] Care Management Coordinator [phone]

## 2017-02-20 ENCOUNTER — Other Ambulatory Visit: Payer: Self-pay

## 2017-02-20 NOTE — Patient Outreach (Signed)
[facility]) Care Management  02/20/17  NIURKA BENECKE 1981/07/10 [id number]  Successful outreach completed with patient. She was only able to speak briefly due to being at her son's ballgame.  She currently denies any concerns or needs. She denies any shortness of breath or chest pain and continues to deny any swelling.   Plan: Home visit scheduled to follow up.  Turkey R. Serenah Mill, RN, BSN, CCM [facility] Care Management Coordinator [phone]

## 2017-02-24 ENCOUNTER — Ambulatory Visit: Payer: Medicare Other

## 2017-02-26 ENCOUNTER — Other Ambulatory Visit: Payer: Self-pay

## 2017-02-26 NOTE — Patient Outreach (Addendum)
[facility]) Care Management   02/26/2017  Makayla Vasquez Jul 24, 1981 [id number]  Makayla Vasquez is an 36 y.o. female  Subjective: Home visit completed with patient.  Objective:  Blood pressure 130/82, pulse 91, weight 146 lb 9.6 oz (66.5 kg), SpO2 96 %. ROS  Physical Exam  Constitutional: She is oriented to person, place, and time.  Cardiovascular: Normal rate, regular rhythm and normal heart sounds.   Respiratory: Effort normal and breath sounds normal.  Neurological: She is alert and oriented to person, place, and time.  Skin: Skin is warm and dry.    Encounter Medications:   Outpatient Encounter Prescriptions as of 02/26/2017  Medication Sig Note  . bisoprolol (ZEBETA) 5 MG tablet Take 0.5 tablets (2.5 mg total) by mouth daily.   . fluocinonide (LIDEX) 0.05 % external solution 1 APPLICATION APPLY ON THE SKIN AS DIRECTED APPLY TO AFFECTED AREAS DAILY ON SCALP AS NEEDED FOR ITCHING.   . furosemide (LASIX) 20 MG tablet Take 1 tablet (20 mg total) by mouth every other day.   . insulin aspart (NOVOLOG) 100 UNIT/ML FlexPen Inject 8 Units into the skin 3 (three) times daily with meals.   . Insulin Glargine (LANTUS SOLOSTAR) 100 UNIT/ML Solostar Pen Inject 25 Units into the skin daily.   [name] ipratropium (ATROVENT) 0.06 % nasal spray Place 2 sprays into both nostrils 4 (four) times daily. (Patient not taking: Reported on 01/23/2017) 01/23/2017: Takes as needed  . losartan (COZAAR) 25 MG tablet Take 0.5 tablets (12.5 mg total) by mouth every evening. (Patient taking differently: Take 12.5 mg by mouth daily. )   . mycophenolate (CELLCEPT) 500 MG tablet Take 1,000 mg by mouth 2 (two) times daily.    . pantoprazole (PROTONIX) 40 MG tablet Take 1 tablet (40 mg total) by mouth daily.   . predniSONE (DELTASONE) 5 MG tablet Take 15 mg by mouth daily with breakfast.  01/08/2017: Patient taking 30 mg daily as of today  . Vitamin D, Ergocalciferol, (DRISDOL) 50000 units CAPS capsule Take  50,000 Units by mouth once a week. 01/04/2017: Tuesday    No facility-administered encounter medications on file as of 02/26/2017.     Functional Status:   In your present state of health, do you have any difficulty performing the following activities: 01/14/2017 01/05/2017  Hearing? N N  Vision? N N  Difficulty concentrating or making decisions? N N  Walking or climbing stairs? N N  Dressing or bathing? N N  Doing errands, shopping? Y N  Preparing Food and eating ? N -  Using the Toilet? N -  In the past six months, have you accidently leaked urine? Y -  Do you have problems with loss of bowel control? N -  Managing your Medications? Y -  Managing your Finances? N -  Housekeeping or managing your Housekeeping? Y -  Some recent data might be hidden    Fall/Depression Screening:    Fall Risk  01/14/2017 12/15/2016 01/09/2016  Falls in the past year? Yes No Yes  Number falls in past yr: 1 - 1  Injury with Fall? No - No  Risk Factor Category  High Fall Risk - -  Risk for fall due to : History of fall(s) - Impaired balance/gait  Follow up Education provided;Falls prevention discussed - -   PHQ 2/9 Scores 01/29/2017 01/14/2017 12/15/2016 11/11/2016 10/16/2016 09/29/2016 08/15/2016  PHQ - 2 Score 0 2 0 0 0 0 2  PHQ- 9 Score - 9 - - - -  5    Assessment:   Patient stated she has been doing well. Denies any chest pain or shortness of breath. For most of visit, patient has been up cleaning and sweeping and she is expecting an inspection from landlord today. Upon assessment, her SpO2 was 91% on O2 at 3lpm. Encouraged patient to take in a few deep breaths and her SpO2 increased to 96%. She does have a cough she stated is from her allergies because she has been outside a lot recently. RNCM provided education about heart failure safety zones and educated patient about when to call her doctor. Encouraged to monitory weights, breathing and edema closely. No edema noted to bilateral lower extremities.  She stated she has not been weighing daily because it never changes, so she is weighing at least once a week. Her reported weight this week is 146.6 lbs.  Patient stated she has not been checking her sugar 3 times a day, but tries to and does check it at least once a day. She has not yet checked it today. She reported that her blood sugar was 346 yesterday, non-fasting. She stated that she has been feeling depressed and has been eating a lot of sweets. She stated that her last A1C was 6.8 (01/05/2017) and that her blood sugars normally range between the 130's to 150's. She stated she will do better with the sweets.  She reported that she is trying to find a new place to live and that has been difficult for her. She wants out of her home she is now and has to find a new place by a certain date before she has to renew her lease at home she is in. This has been causing a lot of stress for her. She has found a house she is interested in, but is not sure it will work out. She also stated she will have to have her own stove and fridge for the home she is interested in, but will depend on if she is able to get it or not. She just wants a yard and more space for her son. She plans to start fresh in new home with all new furniture.  Patient has no other complaints or concerns. RNCM finished up visit so patient could finish preparing for her inspection.   Plan: Will follow up with patient within next month. She was instructed to inform RNCM if she does move prior to next outreach.  Turkey R. Babacar Haycraft, RN, BSN, CCM [facility] Care Management Coordinator [phone]

## 2017-03-03 ENCOUNTER — Ambulatory Visit (INDEPENDENT_AMBULATORY_CARE_PROVIDER_SITE_OTHER): Payer: Medicare Other | Admitting: *Deleted

## 2017-03-03 DIAGNOSIS — Z111 Encounter for screening for respiratory tuberculosis: Secondary | ICD-10-CM

## 2017-03-03 NOTE — Progress Notes (Signed)
   PPD placed Left Forearm.  Pt to return 03/05/17 for reading.  Pt tolerated intradermal injection. Clovis Pu, RN

## 2017-03-04 IMAGING — CR DG CHEST 2V
2 series · 2 of 2 positions shown · non-contrast
Comparison: PA and lateral chest x-ray dated December 11, 2014 and
October 24, 2014.

CLINICAL DATA: Routine examination, history of atrial ventricular
block treated with permanent pacemaker, hypertension, CHF,
pneumonitis, and diabetes

EXAM:
CHEST  2 VIEW

[view not recorded (1 of 2)]
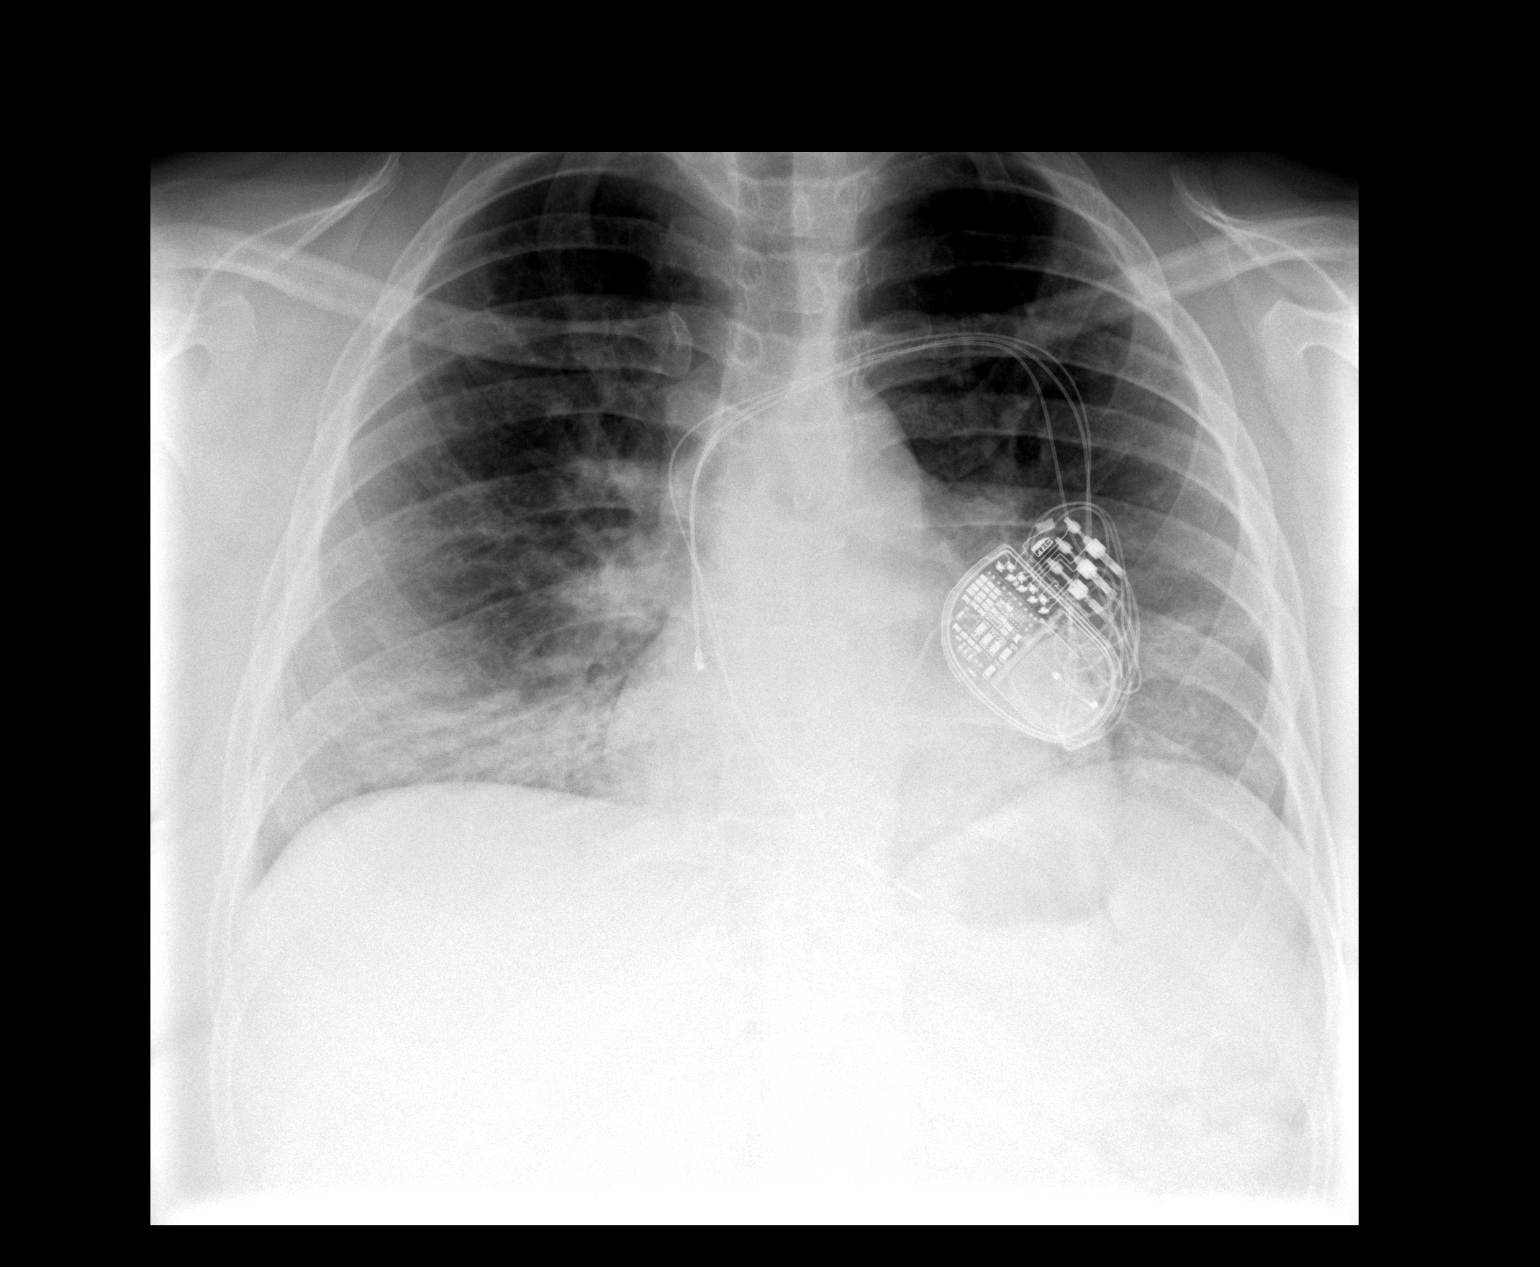

[view not recorded (2 of 2)]
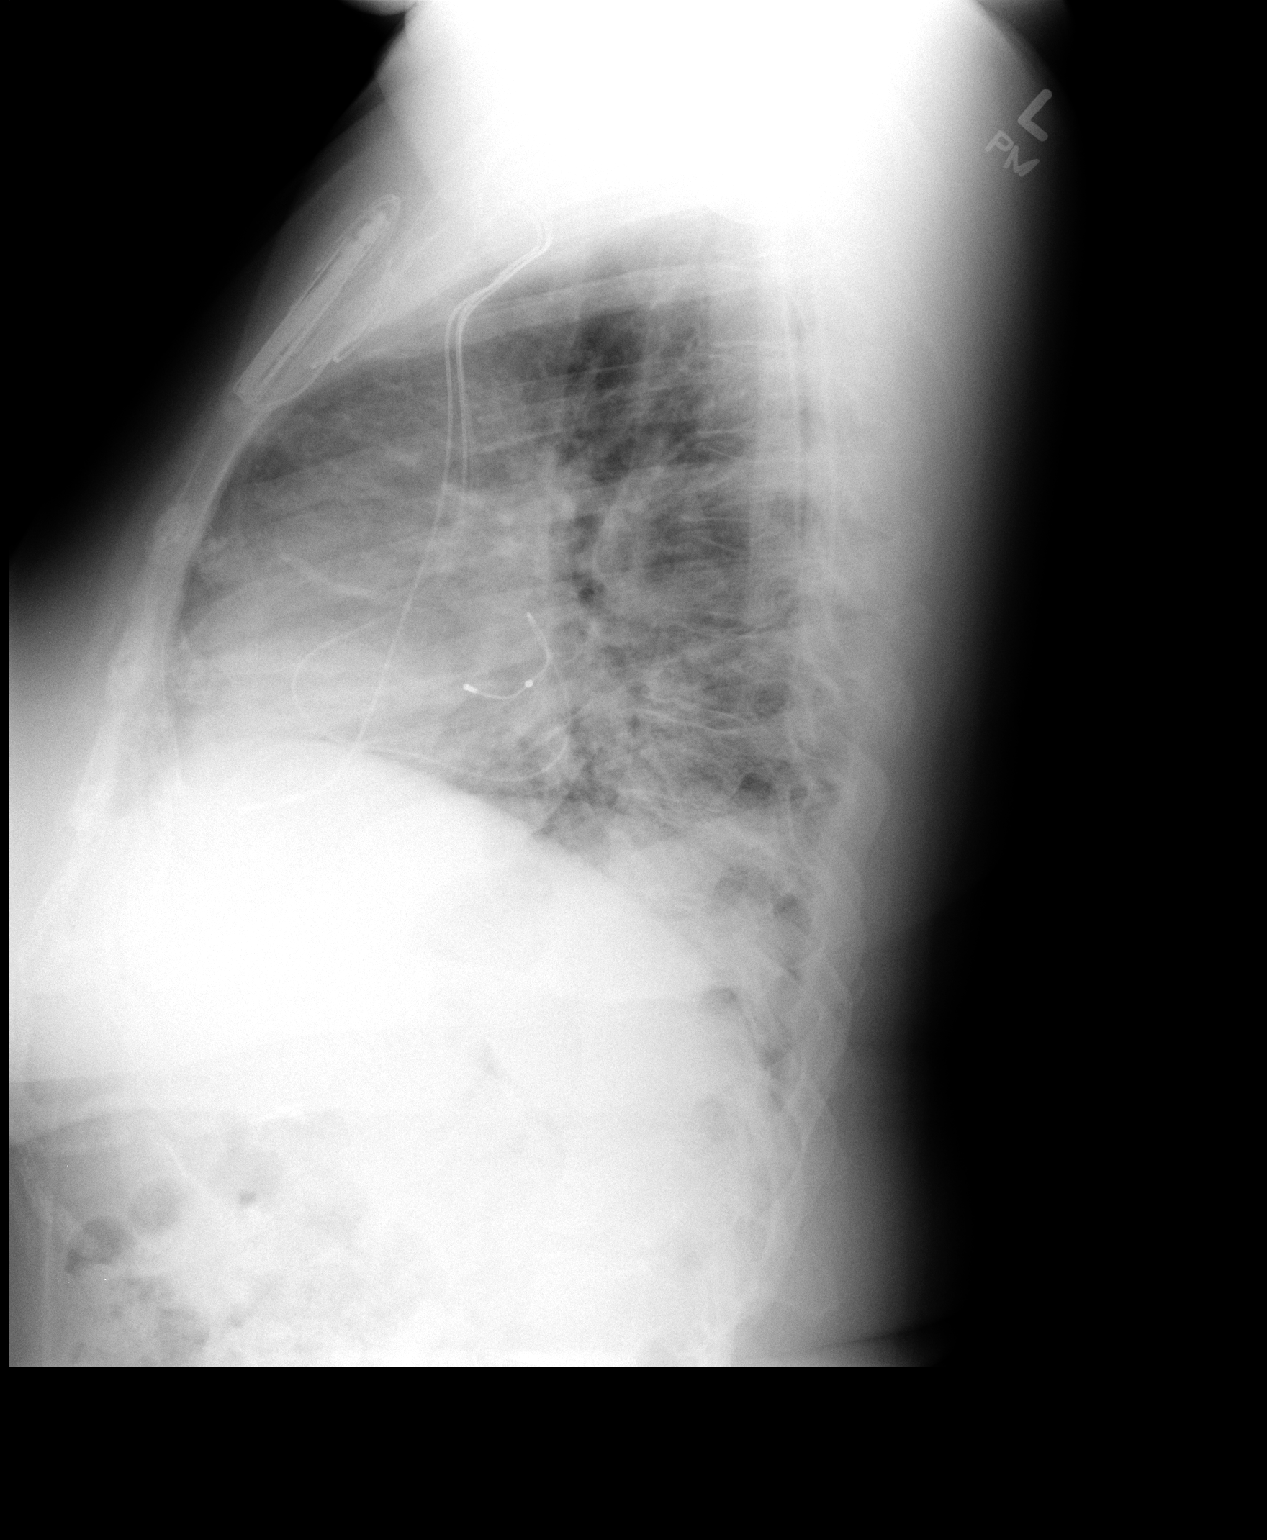

[2 of 2 positions shown; findings below may reference images not displayed]

FINDINGS: The lungs are adequately inflated. The interstitial markings remain
increased at the lung bases. The heart and pulmonary vascularity are
normal. The permanent pacemaker is unchanged in position. The
mediastinum is normal in width. There is no pleural effusion. The
bony thorax is unremarkable.
IMPRESSION: Chronic changes at the lung bases. There is no active
cardiopulmonary disease.

## 2017-03-05 ENCOUNTER — Ambulatory Visit (INDEPENDENT_AMBULATORY_CARE_PROVIDER_SITE_OTHER): Payer: Medicare Other | Admitting: *Deleted

## 2017-03-05 ENCOUNTER — Encounter: Payer: Self-pay | Admitting: *Deleted

## 2017-03-05 DIAGNOSIS — Z111 Encounter for screening for respiratory tuberculosis: Secondary | ICD-10-CM

## 2017-03-05 LAB — TB SKIN TEST
INDURATION: 0 mm
TB SKIN TEST: NEGATIVE

## 2017-03-05 NOTE — Progress Notes (Signed)
   PPD Reading Note PPD read and results entered in EpicCare. Result: 0 mm induration. Interpretation: Negative If test not read within 48-72 hours of initial placement, patient advised to repeat in other arm 1-3 weeks after this test. Allergic reaction: no  Martin, Tamika L, RN  

## 2017-03-09 ENCOUNTER — Telehealth: Payer: Self-pay | Admitting: Internal Medicine

## 2017-03-09 NOTE — Telephone Encounter (Signed)
Spoke with KW who spoke with Shanda Bumps who gave the ok for pt to come in tomorrow at 3pm to see TP. Pt was contacted and she agreed to the appt. The appt was made, she had no further questions. Nothing further is needed.

## 2017-03-09 NOTE — Telephone Encounter (Signed)
CY  Please Advise-  Pt called wanting to schedule an appt with you. You do not have any open slots or nor does either of the NP's. Pt is concerned that she feels like her lungs are filling up with fluid. She is coughing with some mucus, not much color as of yet,some sob,some chest tightness,slightly wheezing,denies fever.  Please advise if when can fit pt in or what would you suggest.

## 2017-03-10 ENCOUNTER — Ambulatory Visit (INDEPENDENT_AMBULATORY_CARE_PROVIDER_SITE_OTHER): Payer: Medicare Other | Admitting: Adult Health

## 2017-03-10 ENCOUNTER — Ambulatory Visit: Payer: Medicare Other | Admitting: Family Medicine

## 2017-03-10 ENCOUNTER — Encounter: Payer: Self-pay | Admitting: Adult Health

## 2017-03-10 ENCOUNTER — Ambulatory Visit (INDEPENDENT_AMBULATORY_CARE_PROVIDER_SITE_OTHER)
Admission: RE | Admit: 2017-03-10 | Discharge: 2017-03-10 | Disposition: A | Discharge status: Home / Self Care | Payer: Medicare Other | Source: Ambulatory Visit | Attending: Adult Health | Admitting: Adult Health

## 2017-03-10 VITALS — BP 90/64 | HR 106 | Ht 65.0 in | Wt 147.6 lb

## 2017-03-10 DIAGNOSIS — J4520 Mild intermittent asthma, uncomplicated: Secondary | ICD-10-CM | POA: Diagnosis not present

## 2017-03-10 DIAGNOSIS — J9611 Chronic respiratory failure with hypoxia: Secondary | ICD-10-CM

## 2017-03-10 DIAGNOSIS — J849 Interstitial pulmonary disease, unspecified: Secondary | ICD-10-CM | POA: Diagnosis not present

## 2017-03-10 DIAGNOSIS — R0602 Shortness of breath: Secondary | ICD-10-CM | POA: Diagnosis not present

## 2017-03-10 DIAGNOSIS — R05 Cough: Secondary | ICD-10-CM | POA: Diagnosis not present

## 2017-03-10 MED ORDER — BENZONATATE 200 MG PO CAPS: 200.0000 mg | ORAL_CAPSULE | Freq: Three times a day (TID) | ORAL | 1 refills | Status: DC | PRN

## 2017-03-10 NOTE — Progress Notes (Signed)
@Patient  ID: Makayla Vasquez, female    DOB: 11/05/1980, 36 y.o.   MRN: [id number]  Chief Complaint  Patient presents with  . 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   TEST  CT chest 12/2016 >IMPRESSION: 1. Pulmonary parenchymal pattern of reticulonodular coarsening and basilar predominant ground-glass is stable from 12/10/2015 , 6 mm LLL nodule  Allergy profile 02/23/2014-negative with total IgE 22.9 HSP panel was neg.  Positive ANA, a speckled positive ANA 1-80 pattern, mildly elevated ESR, non-reactive HIV, and negative PPD EF (25-30%, from baseline 30-25% in 2013).  2015- CXR-bilateral progressive lower lobe peribronchial infiltrates with groundglass changes and nodular pattern consistent with acute pneumonitis of unclear etiology Walk test on room air: 44/22/16: 98%, 92%, 91%, 90% at rest on room air. CT chest 06/28/2015-Severe bilaterally symmetric ground-glass consolidation.   03/10/2017 Acute OV : Cough  Pt presents for an acute office visit . Complains of 2 weeks of cough with clear mucus. No increased dyspnea. Has sinus /nasal drainage . No sinus pain /pressure. No fever. Denies wheezing , edema , or fever. Cough is aggravating , coughs so hard she gets strangled.  On Oxygen 3l/m . No drop in O2 sats at home . Good appetite w/ no n/vd.   Remains on Cellcept and Prednisone 33m daily . [facility]   Allergies  Allergen Reactions  . 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 Sertraline Hcl Hives  . Tape Other (See Comments)    Burns skin    Immunization History    Administered Date(s) Administered  . Influenza Split 12/25/2011, 09/27/2012  . Influenza,inj,Quad PF,36+ Mos 10/28/2013, 08/07/2014, 09/29/2016  . Influenza-Unspecified 08/20/2015  . PPD Test 05/18/2012, 09/10/2012, 03/27/2014, 02/12/2015, 03/03/2017  . Pneumococcal Polysaccharide-23 02/16/2015  . Td 03/20/2006    Past Medical History:  Diagnosis Date  . Anxiety   . Arthritis    rheumatoid arthritis- mild, no rheumatology care   . Asthma    seasonal allergies   . Asymptomatic LV dysfunction    a. Echo in Dec 2011 with EF 35 to 40%. Felt to be due to paced rhythm. b. EF 25-30% in 07/2014.  . Bipolar affective disorder ([place]   . Cardiac pacemaker    a. Since age [age]in 12 b. Upgrade to BiV in 2013.  .Marland KitchenCarpal tunnel syndrome of right wrist   . CHF (congestive heart failure) ([place]   . Congenital complete AV block   . Depression    bipolar  . Diabetes mellitus without complication ([place]   . GERD (gastroesophageal reflux disease)   . Hypertension   . Lupus   . Lupus (systemic lupus erythematosus) ([place]   . Obesity   . Pneumonitis    a. a/w hypoxia - inflammatory - large workup 07/2014.  . Presence of permanent cardiac pacemaker   . Seizures ([place]    as a child- from high fever  . Sinus tachycardia     Tobacco History: History  Smoking Status  . Former Smoker  . Packs/day: 0.25  . Years: 0.50  . Types: Cigarettes  . Quit date: 07/25/1996  Smokeless Tobacco  . Never Used   Counseling  given: Not Answered   Outpatient Encounter Prescriptions as of 03/10/2017  Medication Sig  . bisoprolol (ZEBETA) 5 MG tablet Take 0.5 tablets (2.5 mg total) by mouth daily.  . fluocinonide (LIDEX) 0.05 % external solution 1 APPLICATION APPLY ON THE SKIN AS DIRECTED APPLY TO AFFECTED AREAS DAILY ON SCALP AS NEEDED FOR ITCHING.  . furosemide (LASIX) 20 MG tablet Take 1 tablet (20 mg total) by mouth every other day.  . insulin aspart (NOVOLOG) 100 UNIT/ML FlexPen Inject 8 Units into the  skin 3 (three) times daily with meals.  . Insulin Glargine (LANTUS SOLOSTAR) 100 UNIT/ML Solostar Pen Inject 25 Units into the skin daily.  [name] ipratropium (ATROVENT) 0.06 % nasal spray Place 2 sprays into both nostrils 4 (four) times daily.  [name] losartan (COZAAR) 25 MG tablet Take 0.5 tablets (12.5 mg total) by mouth every evening. (Patient taking differently: Take 12.5 mg by mouth daily. )  . mycophenolate (CELLCEPT) 500 MG tablet Take 1,000 mg by mouth 2 (two) times daily.   . pantoprazole (PROTONIX) 40 MG tablet Take 1 tablet (40 mg total) by mouth daily.  . predniSONE (DELTASONE) 5 MG tablet Take 30 mg by mouth daily with breakfast.   . tacrolimus (PROGRAF) 1 MG capsule Take 1 mg by mouth 2 (two) times daily.  . Vitamin D, Ergocalciferol, (DRISDOL) 50000 units CAPS capsule Take 50,000 Units by mouth once a week.  . benzonatate (TESSALON) 200 MG capsule Take 1 capsule (200 mg total) by mouth 3 (three) times daily as needed for cough.   No facility-administered encounter medications on file as of 03/10/2017.      Review of Systems  Constitutional:   No  weight loss, night sweats,  Fevers, chills, fatigue, or  lassitude.  HEENT:   No headaches,  Difficulty swallowing,  Tooth/dental problems, or  Sore throat,                No sneezing, itching, ear ache,  +nasal congestion, post nasal drip,   CV:  No chest pain,  Orthopnea, PND, swelling in lower extremities, anasarca, dizziness, palpitations, syncope.   GI  No heartburn, indigestion, abdominal pain, nausea, vomiting, diarrhea, change in bowel habits, loss of appetite, bloody stools.   Resp:    No chest wall deformity  Skin: no rash or lesions.  GU: no dysuria, change in color of urine, no urgency or frequency.  No flank pain, no hematuria   MS:  No joint pain or swelling.  No decreased range of motion.  No back pain.    Physical Exam  BP 90/64 (BP Location: Left Arm, Cuff Size: Normal)   Pulse (!) 106   Ht 5' 5"  (1.651 m)   Wt  147 lb 9.6 oz (67 kg)   SpO2 97%   BMI 24.56 kg/m   GEN: A/Ox3; pleasant , NAD, On O2 .    HEENT:  [place]/AT,  EACs-clear, TMs-wnl, NOSE-clear, THROAT-clear, no lesions, no postnasal drip or exudate noted.   NECK:  Supple w/ fair ROM; no JVD; normal carotid impulses w/o bruits; no thyromegaly or nodules palpated; no lymphadenopathy.    RESP  Clear  P & A; w/o, wheezes/ rales/ or rhonchi. no accessory muscle use, no dullness to percussion  CARD:  RRR, no m/r/g, no peripheral edema, pulses intact, no cyanosis or clubbing.  GI:   Soft & nt; nml bowel sounds; no organomegaly or masses detected.   Musco: Warm bil, no deformities or joint swelling noted.   Neuro: alert,  no focal deficits noted.    Skin: Warm, no lesions or rashes    Lab Results:  Imaging: Dg Chest 2 View  Result Date: 03/10/2017 CLINICAL DATA:  Increased short of breath, cough and congestion EXAM: CHEST  2 VIEW COMPARISON:  CT 01/05/2017, CT 02/22/2016 FINDINGS: LEFT-sided pacemaker with multiple continuous leads overlies normal cardiac silhouette. There is fine reticular nodular pattern throughout the lungs similar to comparison CT. No focal infiltrate. No pneumothorax. IMPRESSION: 1. No acute findings. 2. Fine reticulonodular pattern throughout the lungs corresponds to comparison CT exams. Electronically Signed   By: Suzy Bouchard M.D.   On: 03/10/2017 15:52     Assessment & Plan:   Chronic respiratory failure with hypoxia (HCC) Compensated on O2 w/ no desats on 3lm .   ILD (interstitial lung disease) ([place]) Doubt increased flare no changes on CXR  Suspect cough is from [place] . No change in pred dose at 32m daily.  Cont w/ follow up with rheumatology .   Mild intermittent asthma without complication Stable , cough ? AR   Plan  Patient Instructions  Restart Claritin 184m daily .  Restart Flonase 2 puffs .daily.  Delsym 2 tsp Twice daily  As needed  Cough .  Tessalon Three times a day  As needed  Cough    Please contact office for sooner follow up if symptoms do not improve or worsen or seek emergency care  Follow up with Dr. YoAnnamaria BootsIn 3 months and As needed          TaRexene EdisonNP 03/10/2017

## 2017-03-10 NOTE — Assessment & Plan Note (Signed)
Compensated on O2 w/ no desats on 3lm .

## 2017-03-10 NOTE — Patient Instructions (Signed)
Restart Claritin 10mg . daily .  Restart Flonase 2 puffs .daily.  Delsym 2 tsp Twice daily  As needed  Cough .  Tessalon Three times a day  As needed  Cough  Please contact office for sooner follow up if symptoms do not improve or worsen or seek emergency care  Follow up with Dr.  In 3 months and As needed

## 2017-03-10 NOTE — Assessment & Plan Note (Signed)
Doubt increased flare no changes on CXR  Suspect cough is from AR . No change in pred dose at 30mg  daily.  Cont w/ follow up with rheumatology .

## 2017-03-10 NOTE — Assessment & Plan Note (Signed)
Stable , cough ? AR   Plan  Patient Instructions  Restart Claritin 10mg . daily .  Restart Flonase 2 puffs .daily.  Delsym 2 tsp Twice daily  As needed  Cough .  Tessalon Three times a day  As needed  Cough  Please contact office for sooner follow up if symptoms do not improve or worsen or seek emergency care  Follow up with Dr.  In 3 months and As needed

## 2017-03-13 ENCOUNTER — Other Ambulatory Visit: Payer: Self-pay | Admitting: Family Medicine

## 2017-03-23 ENCOUNTER — Ambulatory Visit (HOSPITAL_COMMUNITY)
Admission: RE | Admit: 2017-03-23 | Discharge: 2017-03-23 | Disposition: A | Discharge status: Home / Self Care | Payer: Medicare Other | Source: Ambulatory Visit | Attending: Cardiology | Admitting: Cardiology

## 2017-03-23 ENCOUNTER — Encounter (HOSPITAL_COMMUNITY): Payer: Self-pay

## 2017-03-23 VITALS — BP 124/80 | HR 116 | Wt 147.0 lb

## 2017-03-23 DIAGNOSIS — M069 Rheumatoid arthritis, unspecified: Secondary | ICD-10-CM | POA: Insufficient documentation

## 2017-03-23 DIAGNOSIS — Z794 Long term (current) use of insulin: Secondary | ICD-10-CM | POA: Diagnosis not present

## 2017-03-23 DIAGNOSIS — I428 Other cardiomyopathies: Secondary | ICD-10-CM | POA: Diagnosis not present

## 2017-03-23 DIAGNOSIS — Z87891 Personal history of nicotine dependence: Secondary | ICD-10-CM | POA: Diagnosis not present

## 2017-03-23 DIAGNOSIS — I110 Hypertensive heart disease with heart failure: Secondary | ICD-10-CM | POA: Insufficient documentation

## 2017-03-23 DIAGNOSIS — M329 Systemic lupus erythematosus, unspecified: Secondary | ICD-10-CM | POA: Diagnosis not present

## 2017-03-23 DIAGNOSIS — I5032 Chronic diastolic (congestive) heart failure: Secondary | ICD-10-CM

## 2017-03-23 DIAGNOSIS — Z950 Presence of cardiac pacemaker: Secondary | ICD-10-CM | POA: Diagnosis not present

## 2017-03-23 DIAGNOSIS — Z9981 Dependence on supplemental oxygen: Secondary | ICD-10-CM | POA: Diagnosis not present

## 2017-03-23 DIAGNOSIS — Z79899 Other long term (current) drug therapy: Secondary | ICD-10-CM | POA: Insufficient documentation

## 2017-03-23 DIAGNOSIS — K219 Gastro-esophageal reflux disease without esophagitis: Secondary | ICD-10-CM | POA: Insufficient documentation

## 2017-03-23 DIAGNOSIS — E119 Type 2 diabetes mellitus without complications: Secondary | ICD-10-CM | POA: Insufficient documentation

## 2017-03-23 DIAGNOSIS — Q246 Congenital heart block: Secondary | ICD-10-CM

## 2017-03-23 DIAGNOSIS — Z888 Allergy status to other drugs, medicaments and biological substances status: Secondary | ICD-10-CM | POA: Insufficient documentation

## 2017-03-23 DIAGNOSIS — J849 Interstitial pulmonary disease, unspecified: Secondary | ICD-10-CM | POA: Insufficient documentation

## 2017-03-23 DIAGNOSIS — R000 Tachycardia, unspecified: Secondary | ICD-10-CM | POA: Insufficient documentation

## 2017-03-23 DIAGNOSIS — I5022 Chronic systolic (congestive) heart failure: Secondary | ICD-10-CM | POA: Insufficient documentation

## 2017-03-23 DIAGNOSIS — Z8249 Family history of ischemic heart disease and other diseases of the circulatory system: Secondary | ICD-10-CM | POA: Diagnosis not present

## 2017-03-23 LAB — BASIC METABOLIC PANEL
Anion gap: 11 (ref 5–15)
BUN: 12 mg/dL (ref 6–20)
CHLORIDE: 103 mmol/L (ref 101–111)
CO2: 25 mmol/L (ref 22–32)
CREATININE: 0.59 mg/dL (ref 0.44–1.00)
Calcium: 9.2 mg/dL (ref 8.9–10.3)
GFR calc Af Amer: >60 mL/min (ref >60)
GFR calc non Af Amer: >60 mL/min (ref >60)
Glucose, Bld: 126 mg/dL — ABNORMAL HIGH (ref 65–99)
POTASSIUM: 3.6 mmol/L (ref 3.5–5.1)
Sodium: 139 mmol/L (ref 135–145)

## 2017-03-23 MED ORDER — LOSARTAN POTASSIUM 25 MG PO TABS: 12.5000 mg | ORAL_TABLET | Freq: Every day | ORAL | 3 refills | Status: DC

## 2017-03-23 MED ORDER — BISOPROLOL FUMARATE 5 MG PO TABS: 2.5000 mg | ORAL_TABLET | Freq: Every day | ORAL | 3 refills | Status: DC

## 2017-03-23 NOTE — Patient Instructions (Signed)
Labs today (will call for abnormal results, otherwise no news is good news)  START Bisoprolol 2.5 mg (0.5 Tablet) Once Daily in the AM.  START Losartan 12.5 mg (0.5 Tablet) Once Daily at bedtime.   Follow up in 6 weeks.

## 2017-03-23 NOTE — Progress Notes (Signed)
Patient ID: Makayla Vasquez, female   DOB: 02/26/1981, 36 y.o.   MRN: [id number] PCP: Dr Natale Milch Cardiology: Dr Shirlee Latch Pulmonary: Dr [facility] and Dr Maple Hudson.   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   She has a h/o congenital HB and underwent first PPM at age 28. In 2011 had ECHO with EF 35-40%. LV dysfunction felt to be due to RV pacing so upgraded to CRT-P. Echo in 10/15 with EF 25%.  She also has concomitant lung disease and this has limited b-blocker dosing.  She follows with Dr. Maple Hudson for her lung disease.Admitted in 8/15 and 9/15 with worsening respiratory status and severe hypoxia. Had extensive w/u. Thought to have a viral pneumonitis complicated by atelectasis and mild edema vs. an autoimmune process. Discharged home on a course of levaquin and prednisone.   She was offered a lung biopsy for further diagnosis versus empiric treatment with steroids. She opted for empiric steroid treatment. She is followed by a rheumatologist and is on Cellcept and prednisone taper. 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  She is on oxygen and night and with activity.   She returns today for HF follow up. Overall feeling well. Continues to wear home 02 at 2L. No SOB with walking into clinic. No SOB with stairs. Taking all medications, says that when she takes her losartan in the morning her BP stops to the 97-98 range. She does feel "bad" when it is that low, but does not feel dizzy or lightheaded. Weights at home 145-148 pounds. Eating some high sodium foods, drinking more than 2L a day.   PFTs (12/15) showed a severe restrictive defect concerning for an interstitial process.   EP study 10/15 for persistent tachycardia and found to have sinus tachycardia.   06/16/14 CT scan showed bilateral progressive lower peribronchial infiltrates with  groundglass changes and nodular patter consistent with acute pneumonitis of unclear etiology.  06/20/14 BAL showed 60% macrophages and 30% PMNs. Her AFB smear, fungal smear, legionella Ab, pneumocystis smear, ACE, and sputum culture are negative. Her AFB, legionella, fungal, and BAL culture as well as her fungal Ab, hypersensitivity pneumonitis panel were all negative Serology: DsDNA, RF, ANCA, HSP all negative  ECHO  1/17 showed EF 40-45%, mild LVH, diffuse hypokinesis.  8/17 with EF improved to 50-55%, PASP 48 mmHg.  2/18 EF 45-50%   RHC 10/26/2014  RA = 2 RV = 27/1/5 PA = 29/7 (18) PCW = 4 Fick cardiac output/index = 4.5/2.2 PVR = 3.0 WU FA sat = 98% PA sat = 64%, 61%  Labs (10/15): TSH normal K 3.6, creatinine 1.1 Labs (10/31/2014) L k 3.9 Creatinine 1.06  Labs 07/23/2015: K 3.8 Creatinine 0.82  Labs (3/17): K 4, creatinine 0.6 Labs (5/17): K 4.1, creatinine 0.7 Labs (1/18): K 3.6, creatinine 0.42  Review of Systems: All systems reviewed and negative except as per HPI.   Past Medical History:  Diagnosis Date  . Anxiety   . Arthritis    rheumatoid arthritis- mild, no rheumatology care   . Asthma    seasonal allergies   . Asymptomatic LV dysfunction    a. Echo in Dec 2011 with EF 35 to 40%. Felt to be due to paced rhythm. b. EF 25-30% in 07/2014.  . Bipolar affective disorder (HCC)   . Cardiac pacemaker    a. Since age 72 in 55.  b. Upgrade to BiV in 2013.  [name] Carpal tunnel syndrome of right wrist   . CHF (congestive heart failure) (HCC)   . Congenital complete AV block   . Depression    bipolar  . Diabetes mellitus without complication (HCC)   . GERD (gastroesophageal reflux disease)   . Hypertension   . Lupus   . Lupus (systemic lupus erythematosus) (HCC)   . Obesity   . Pneumonitis    a. a/w hypoxia - inflammatory - large workup 07/2014.  . Presence of permanent cardiac pacemaker   . Seizures (HCC)    as a child- from high fever  . Sinus tachycardia      Current Outpatient Prescriptions  Medication Sig Dispense Refill  . benzonatate (TESSALON) 200 MG capsule Take 1 capsule (200 mg total) by mouth 3 (three) times daily as needed for cough. 30 capsule 1  . fluocinonide (LIDEX) 0.05 % external solution 1 APPLICATION APPLY ON THE SKIN AS DIRECTED APPLY TO AFFECTED AREAS DAILY ON SCALP AS NEEDED FOR ITCHING.  5  . furosemide (LASIX) 20 MG tablet Take 1 tablet (20 mg total) by mouth every other day. 15 tablet 3  . insulin aspart (NOVOLOG) 100 UNIT/ML FlexPen Inject 8 Units into the skin 3 (three) times daily with meals. 15 mL 11  . Insulin Glargine (LANTUS SOLOSTAR) 100 UNIT/ML Solostar Pen Inject 25 Units into the skin daily. 15 mL 5  . ipratropium (ATROVENT) 0.06 % nasal spray Place 2 sprays into both nostrils 4 (four) times daily. 15 mL 1  . losartan (COZAAR) 25 MG tablet Take 0.5 tablets (12.5 mg total) by mouth every evening. (Patient taking differently: Take 12.5 mg by mouth daily. ) 15 tablet 3  . mycophenolate (CELLCEPT) 500 MG tablet Take 1,000 mg by mouth 2 (two) times daily.     . pantoprazole (PROTONIX) 40 MG tablet Take 1 tablet (40 mg total) by mouth daily. 30 tablet 5  . pantoprazole (PROTONIX) 40 MG tablet TAKE 1 TABLET (40 MG TOTAL) BY MOUTH DAILY. 30 tablet 2  . predniSONE (DELTASONE) 5 MG tablet Take 30 mg by mouth daily with breakfast.     . tacrolimus (PROGRAF) 1 MG capsule Take 1 mg by mouth 2 (two) times daily.    . Vitamin D, Ergocalciferol, (DRISDOL) 50000 units CAPS capsule Take 50,000 Units by mouth once a week.  1   No current facility-administered medications for this encounter.     Allergies  Allergen Reactions  . 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 Sertraline Hcl Hives  . Tape Other (See Comments)    Burns skin      Social History   Social History  . Marital status: Single    Spouse name: N/A  . Number of children: 1  . Years of  education: N/A   Occupational History  . CNA    Social History Main Topics  . Smoking status: Former Smoker    Packs/day: 0.25    Years: 0.50    Types: Cigarettes    Quit date: 07/25/1996  . Smokeless tobacco: Never Used  . Alcohol use No  . Drug use: No  . Sexual activity: Yes    Birth control/ protection: Implant, IUD     Comment: placed 04/2011   Other Topics Concern  . Not on file   Social History Narrative   Works at [employer].        Family  History  Problem Relation Age of Onset  . Heart disease Mother        CHF (no details)  . Hypertension Mother   . Lupus Mother   . Heart disease Father        Murmur  . Heart disease Sister 35        No details.  History of a pacemaker    Vitals:   03/23/17 1038  BP: 124/80  Pulse: (!) 116  SpO2: 98%  Weight: 147 lb (66.7 kg)    PHYSICAL EXAM: General:  Female, NAD. Walked into clinic without difficulty.  HEENT: normal Neck: supple. No JVP. Carotids 2+ bilat; no bruits. No lymphadenopathy or thryomegaly appreciated. Cor: PMI nonpalpable. Tachycardiac. Normal rhythm.  No rubs, gallops or murmurs. Lungs: Clear bilaterally. Normal effort. Wearing 02.  Abdomen: Soft, NT, ND. No hepatosplenomegaly. No bruits or masses. + bowel sounds.  Extremities: no cyanosis, clubbing, rash. Warm. No edema.  Neuro: alert & oriented x 3, cranial nerves grossly intact. moves all 4 extremities w/o difficulty. Affect pleasant.   ASSESSMENT & PLAN: 1) Chronic systolic HF:  Echo 05/2014 EF 20-25%, echo 1/17 improved with EF 40-45% and echo in 8/17 with EF up to 50-55%. Nonischemic cardiomyopathy.  Medtronic CRT-P device.  - NYHA II, limited by interstitial lung disease - Volume status stable. Continue 20mg  Lasix every other day.  - HR elevated today. She says she has not taken any bisoprolol in many weeks. Restart bisprolol 2.5 mg in the am. Will see her in 6 weeks, may need to increase to 5mg . Her resting HR today is 116.  - Advised her to  take her losartan 12.5 mg at night.  2) Anti-synthetase syndrome with interstitial lung disease:  -  She is followed by rheumatology and is on Cellcept and prednisone.  - Also [facility] at  [facility]  3) Congenital AVN block s/p pacer:  - Follows with Dr. . Has not been seen since 2016.Needs to follow up.   Follow up in 6 weeks as we need to follow her tachycardia. BMET today.   Ladona Ridgel NP_C  03/23/2017

## 2017-03-24 ENCOUNTER — Ambulatory Visit: Payer: Self-pay

## 2017-03-25 ENCOUNTER — Ambulatory Visit: Payer: Medicare Other | Admitting: Psychology

## 2017-03-26 ENCOUNTER — Encounter (HOSPITAL_COMMUNITY): Payer: Medicare Other

## 2017-03-30 ENCOUNTER — Encounter: Payer: Self-pay | Admitting: Student

## 2017-03-30 ENCOUNTER — Ambulatory Visit (INDEPENDENT_AMBULATORY_CARE_PROVIDER_SITE_OTHER): Payer: Medicare Other | Admitting: Student

## 2017-03-30 VITALS — BP 100/60 | HR 84 | Temp 98.0°F | Ht 65.0 in | Wt 147.6 lb

## 2017-03-30 DIAGNOSIS — J0110 Acute frontal sinusitis, unspecified: Secondary | ICD-10-CM

## 2017-03-30 MED ORDER — AMOXICILLIN-POT CLAVULANATE 875-125 MG PO TABS: 1.0000 | ORAL_TABLET | Freq: Two times a day (BID) | ORAL | 0 refills | Status: DC

## 2017-03-30 NOTE — Progress Notes (Signed)
Subjective:    Makayla Vasquez is a 36 y.o. old female here for nasal discharge and congestion  HPI Nasal discharge: for 7 days. She described the nasal discharge as greenish in color. She also feels some pressure like pain between her eyes. Reports cough with greenish phlegm for 7 days as well. Denies hemoptysis. She reports "funny sensation" in her throat. She has history of allergies and uses Flonase. She is also on 3 L oxygen for ILD. No increased oxygen requirement. She denies fever, chills, dyspnea, chest pain, orthopnea, PND or edema. Denies nausea or vomiting.  She reports loose stools 2 a day. Denies blood in stool. Eating and drinking as usual. Denies sick contact or new medicine. Denies smoking cigarettes.   PMH/Problem List: has POLYCYSTIC OVARY; Mild intermittent asthma without complication; RH FACTOR, NEGATIVE; THYROGLOSSAL DUCT CYST; PPM-Medtronic; Congenital complete AV block; GERD (gastroesophageal reflux disease); Intermittent abdominal pain; Chronic systolic heart failure (HCC); Constipated; Acute sinusitis; Vaginal itching; Congestive dilated cardiomyopathy (HCC); ILD (interstitial lung disease) (HCC); Insertion of implantable subdermal contraceptive; Chronic respiratory failure with hypoxia (HCC); Dehydration; Diabetes mellitus type 2 in obese [facility]); CHF (congestive heart failure) (HCC); Onychomycosis; Screening for cervical cancer; Hyperlipidemia associated with type 2 diabetes mellitus (HCC); Idiopathic hypotension; Antisynthetase syndrome (HCC); Adrenal insufficiency (HCC); Acute on chronic diastolic CHF (congestive heart failure) (HCC); Cardiac pacemaker in situ; Acute pyelonephritis; Generalized weakness; Acute blood loss anemia; Type 2 diabetes mellitus with hyperglycemia, with long-term current use of insulin (HCC); Anemia of chronic disease; Bipolar affective disorder in remission (HCC); Sepsis (HCC); Myalgia and myositis; SIRS (systemic inflammatory response syndrome) (HCC);  Bacteremia; Chronic diastolic congestive heart failure (HCC); Breast pain, right; Hyperglycemia; History of heart failure; Long term (current) use of systemic steroids; Right knee pain; Community acquired bacterial pneumonia; Fever in adult; Lactic acidosis; Leukocytosis; On Cellcept therapy; On prednisone therapy; Systemic lupus erythematosus (HCC); Prolonged QT syndrome; Community acquired pneumonia; Physical deconditioning; UTI (urinary tract infection); Nausea & vomiting; Sepsis due to undetermined organism (HCC); Chronic respiratory failure (HCC); and Sepsis due to Enterococcus [facility]) on her problem list.   has a past medical history of Anxiety; Arthritis; Asthma; Asymptomatic LV dysfunction; Bipolar affective disorder (HCC); Cardiac pacemaker; Carpal tunnel syndrome of right wrist; CHF (congestive heart failure) (HCC); Congenital complete AV block; Depression; Diabetes mellitus without complication (HCC); GERD (gastroesophageal reflux disease); Hypertension; Lupus; Lupus (systemic lupus erythematosus) (HCC); Obesity; Pneumonitis; Presence of permanent cardiac pacemaker; Seizures (HCC); and Sinus tachycardia.  FH:  Family History  Problem Relation Age of Onset  . Heart disease Mother        CHF (no details)  . Hypertension Mother   . Lupus Mother   . Heart disease Father        Murmur  . Heart disease Sister 65        No details.  History of a pacemaker    SH Social History  Substance Use Topics  . Smoking status: Former Smoker    Packs/day: 0.25    Years: 0.50    Types: Cigarettes    Quit date: 07/25/1996  . Smokeless tobacco: Never Used  . Alcohol use No    Review of Systems Review of systems negative except for pertinent positives and negatives in history of present illness above.     Objective:     Vitals:   03/30/17 1016  BP: 100/60  Pulse: 84  Temp: 98 F (36.7 C)  TempSrc: Oral  SpO2: 98%  Weight: 147 lb 9.6 oz (67 kg)  Height: 5'  5" (1.651 m)    Physical  Exam GEN: appears well, no apparent distress. Head: tenderness to palpation over frontal or maxillary sinuses bilaterally. Transillumination test is negative.  Eyes: conjunctiva without injection, sclera anicteric Ears: external ear and ear canal normal Nares: Some rhinorrhea and significant erythema of nasal mucosa Oropharynx: mmm without erythema or exudation HEM: negative for cervical or periauricular lymphadenopathies CVS: RRR, nl S1&S2, no murmurs, no edema RESP: On 3 L by [place] (baseline), speaks in full sentence, no IWOB, good air movement bilaterally, CTAB GI: BS present & normal, soft, NTND MSK: no focal tenderness or notable swelling SKIN: no apparent skin lesion NEURO: alert and oiented appropriately, no gross defecits  PSYCH: euthymic mood with congruent affect    Assessment and Plan:  Acute sinusitis Patient's presentation concerning for acute sinusitis. She has greenish nasal discharge. She is also tender to palpation over frontal or maxillary sinuses bilaterally. However, she have symptoms only for 7 days now. Part of her symptoms could also be from oxygen by [place]. Oropharynx and cardiopulmonary exam within normal limits. No sings of fluid overload. She is sating at 98% on her home oxygen.  Gave a prescription for Augmentin and advised her to fill this only if no improvement in her symptoms in the next 3 days. I also recommended some conservative managements including steam inhalation, humidifiers and proper use of Flonase nasal spray.   Return if symptoms worsen or fail to improve.  Almon Hercules, MD 03/30/17 Pager: [phone]

## 2017-03-30 NOTE — Assessment & Plan Note (Signed)
Patient's presentation concerning for acute sinusitis. She has greenish nasal discharge. She is also tender to palpation over frontal or maxillary sinuses bilaterally. However, she have symptoms only for 7 days now. Part of her symptoms could also be from oxygen by [place]. Oropharynx and cardiopulmonary exam within normal limits. No sings of fluid overload. She is sating at 98% on her home oxygen.  Gave a prescription for Augmentin and advised her to fill this only if no improvement in her symptoms in the next 3 days. I also recommended some conservative managements including steam inhalation, humidifiers and proper use of Flonase nasal spray.

## 2017-03-30 NOTE — Patient Instructions (Signed)
It was great seeing you today! We have addressed the following issues today 1. Nasal discharge/congestion: I have sent a prescription for Augmentin (on antibiotic) to your pharmacy. You can fill this prescription if you symptoms won't improve in 3 days and start taking. I also recommend using the Flonase as we discussed. Sometimes steam inhalation and humidifier could help with congestion.   If we did any lab work today, and the results require attention, either me or my nurse will get in touch with you. If everything is normal, you will get a letter in mail and a message via . If you don't hear from [place] in two weeks, please give [place] a call. Otherwise, we look forward to seeing you again at your next visit. If you have any questions or concerns before then, please call the clinic at [phone].  Please bring all your medications to every doctors visit  Sign up for My Chart to have easy access to your labs results, and communication with your Primary care physician.    Please check-out at the front desk before leaving the clinic.    Take Care,   Dr. Alanda Slim

## 2017-04-01 ENCOUNTER — Other Ambulatory Visit: Payer: Self-pay | Admitting: Internal Medicine

## 2017-04-08 ENCOUNTER — Other Ambulatory Visit: Payer: Self-pay | Admitting: Family Medicine

## 2017-04-08 DIAGNOSIS — E669 Obesity, unspecified: Principal | ICD-10-CM

## 2017-04-08 DIAGNOSIS — E1169 Type 2 diabetes mellitus with other specified complication: Secondary | ICD-10-CM

## 2017-04-09 NOTE — Telephone Encounter (Signed)
Losartan & Furosemide need to be refilled by HF clinic.  Other refills need to be submitted through the family medicine clinic.  Dr. Althea Charon is no longer this patient's PCP.

## 2017-04-10 DIAGNOSIS — Z79899 Other long term (current) drug therapy: Secondary | ICD-10-CM | POA: Diagnosis not present

## 2017-04-10 DIAGNOSIS — M3320 Polymyositis, organ involvement unspecified: Secondary | ICD-10-CM | POA: Diagnosis not present

## 2017-04-10 DIAGNOSIS — D8989 Other specified disorders involving the immune mechanism, not elsewhere classified: Secondary | ICD-10-CM | POA: Diagnosis not present

## 2017-04-14 ENCOUNTER — Ambulatory Visit (INDEPENDENT_AMBULATORY_CARE_PROVIDER_SITE_OTHER): Payer: Medicare Other | Admitting: Podiatry

## 2017-04-14 ENCOUNTER — Encounter: Payer: Self-pay | Admitting: Podiatry

## 2017-04-14 DIAGNOSIS — S91209A Unspecified open wound of unspecified toe(s) with damage to nail, initial encounter: Secondary | ICD-10-CM

## 2017-04-14 DIAGNOSIS — E1149 Type 2 diabetes mellitus with other diabetic neurological complication: Secondary | ICD-10-CM | POA: Diagnosis not present

## 2017-04-14 DIAGNOSIS — B351 Tinea unguium: Secondary | ICD-10-CM

## 2017-04-14 NOTE — Progress Notes (Signed)
   Subjective:    Patient ID: Makayla Vasquez, female    DOB: 1981-08-18, 36 y.o.   MRN: [id number]  HPI This patient presents today concerned about the hallux toenails which are beginning to slough from the underlying nailbed. Patient recalls direct injury to the right great toe approximately 2 weeks ago resulting in a loosing of the toenail. Patient said that she did not have any self treatment or professional treatment. She also noticed approximately the same time. The left hallux toenail was partially sloughing without any history of direct injury. Patient is diabetic and denies history of foot ulceration, claudication or amputation Patient denies history of pain, numbness, tingling Patient states she smoked briefly for several months at age 67 Patient's mothers treatment today  Review of Systems  All other systems reviewed and are negative.      Objective:   Physical Exam  Patient using nasal oxygen She dates she is 5 foot 5 inches weighs approximately 147 pounds  Vascular: No edema bilateral DP and PT pulses 2/4 bilaterally Capillary reflex immediate bilaterally  Neurological: Sensation to 10 g monofilament wire intact 9/9 bilaterally Vibratory sensation reactive bilaterally Ankle reflexes reactive bilaterally  Dermatological: No open skin lesions bilaterally Right hallux is partially detaching for underlying nailbed with evidence of dried blood. There is no surrounding erythema, edema or drainage Left hallux nail is partially lifting from the underlying nailbed  Musculoskeletal: Manual motor testing is a flexion, plantar flexion, inversion, eversion 5/5 bilaterally Stable gait      Assessment & Plan:   Assessment: Satisfactory neurovascular status Partial traumatic nail avulsion right hallux Mycotic left hallux toenail  Plan: Debridement of right and left hallux toenails mechanically and electrically without any bleeding  Reappoint at patient's request

## 2017-04-14 NOTE — Patient Instructions (Signed)

## 2017-04-24 ENCOUNTER — Other Ambulatory Visit: Payer: Self-pay

## 2017-04-24 NOTE — Patient Outreach (Signed)
[facility]) Care Management  04/24/17  Makayla Vasquez 09/21/1981 [id number]  Successful outreach completed with patient. Patient identification verified.  Patient stated that she has been doing good. She stated that her sinuses and allergies have caused her to be short of breath a few times, but otherwise has been doing well. She stated she had a follow up at for her heart failure in June and it went really well also.  Plan: Follow up with a home visit within next 2-3 weeks.  Turkey R. Markis Langland, RN, BSN, CCM [facility] Care Management Coordinator [phone]

## 2017-05-04 ENCOUNTER — Ambulatory Visit (HOSPITAL_COMMUNITY)
Admission: RE | Admit: 2017-05-04 | Discharge: 2017-05-04 | Disposition: A | Discharge status: Home / Self Care | Payer: Medicare Other | Source: Ambulatory Visit | Attending: Cardiology | Admitting: Cardiology

## 2017-05-04 ENCOUNTER — Encounter (HOSPITAL_COMMUNITY): Payer: Self-pay

## 2017-05-04 VITALS — BP 102/70 | HR 96 | Wt 155.0 lb

## 2017-05-04 DIAGNOSIS — J9610 Chronic respiratory failure, unspecified whether with hypoxia or hypercapnia: Secondary | ICD-10-CM | POA: Insufficient documentation

## 2017-05-04 DIAGNOSIS — J9611 Chronic respiratory failure with hypoxia: Secondary | ICD-10-CM | POA: Diagnosis not present

## 2017-05-04 DIAGNOSIS — I429 Cardiomyopathy, unspecified: Secondary | ICD-10-CM | POA: Insufficient documentation

## 2017-05-04 DIAGNOSIS — J849 Interstitial pulmonary disease, unspecified: Secondary | ICD-10-CM | POA: Diagnosis not present

## 2017-05-04 DIAGNOSIS — R918 Other nonspecific abnormal finding of lung field: Secondary | ICD-10-CM | POA: Insufficient documentation

## 2017-05-04 DIAGNOSIS — K219 Gastro-esophageal reflux disease without esophagitis: Secondary | ICD-10-CM | POA: Insufficient documentation

## 2017-05-04 DIAGNOSIS — F419 Anxiety disorder, unspecified: Secondary | ICD-10-CM | POA: Diagnosis not present

## 2017-05-04 DIAGNOSIS — Z794 Long term (current) use of insulin: Secondary | ICD-10-CM | POA: Insufficient documentation

## 2017-05-04 DIAGNOSIS — F319 Bipolar disorder, unspecified: Secondary | ICD-10-CM | POA: Diagnosis not present

## 2017-05-04 DIAGNOSIS — E119 Type 2 diabetes mellitus without complications: Secondary | ICD-10-CM | POA: Insufficient documentation

## 2017-05-04 DIAGNOSIS — E669 Obesity, unspecified: Secondary | ICD-10-CM | POA: Insufficient documentation

## 2017-05-04 DIAGNOSIS — Z87891 Personal history of nicotine dependence: Secondary | ICD-10-CM | POA: Insufficient documentation

## 2017-05-04 DIAGNOSIS — I110 Hypertensive heart disease with heart failure: Secondary | ICD-10-CM | POA: Diagnosis not present

## 2017-05-04 DIAGNOSIS — M329 Systemic lupus erythematosus, unspecified: Secondary | ICD-10-CM | POA: Diagnosis not present

## 2017-05-04 DIAGNOSIS — I5022 Chronic systolic (congestive) heart failure: Secondary | ICD-10-CM | POA: Diagnosis not present

## 2017-05-04 DIAGNOSIS — M069 Rheumatoid arthritis, unspecified: Secondary | ICD-10-CM | POA: Insufficient documentation

## 2017-05-04 DIAGNOSIS — J45909 Unspecified asthma, uncomplicated: Secondary | ICD-10-CM | POA: Insufficient documentation

## 2017-05-04 DIAGNOSIS — Z950 Presence of cardiac pacemaker: Secondary | ICD-10-CM | POA: Diagnosis not present

## 2017-05-04 NOTE — Patient Instructions (Addendum)
STOP Losartan.  Take lasix 20 mg tablet once today, then resume tomorrow as normal.  Follow up 3 months with Amy Clegg NP-C.  Take all medication as prescribed the day of your appointment. Bring all medications with you to your appointment.  Do the following things EVERYDAY: 1) Weigh yourself in the morning before breakfast. Write it down and keep it in a log. 2) Take your medicines as prescribed 3) Eat low salt foods-Limit salt (sodium) to 2000 mg per day.  4) Stay as active as you can everyday 5) Limit all fluids for the day to less than 2 liters

## 2017-05-04 NOTE — Progress Notes (Signed)
Patient ID: Makayla Vasquez, female   DOB: 26-Jun-1981, 36 y.o.   MRN: [id number] PCP: Dr Natale Milch Cardiology: Dr Shirlee Latch Pulmonary: Dr [facility] and Dr Maple Hudson.   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   She has a h/o congenital HB and underwent first PPM at age 36. In 2011 had ECHO with EF 35-40%. LV dysfunction felt to be due to RV pacing so upgraded to CRT-P. Echo in 10/15 with EF 25%.  She also has concomitant lung disease and this has limited b-blocker dosing.  She follows with Dr. Maple Hudson for her lung disease.Admitted in 8/15 and 9/15 with worsening respiratory status and severe hypoxia. Had extensive w/u. Thought to have a viral pneumonitis complicated by atelectasis and mild edema vs. an autoimmune process. Discharged home on a course of levaquin and prednisone.   She was offered a lung biopsy for further diagnosis versus empiric treatment with steroids. She opted for empiric steroid treatment. She is followed by a rheumatologist and is on Cellcept and prednisone taper. 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  She is on oxygen and night and with activity.   She returns for HF follow up. Last visit low dose bisoprolol was added.  2 weeks ago losartan was stopped due to soft BP. On 2-3 liters. SOB with inclines. SOB with ADLs. Uses electric cart at the grocery store if she feels bad. Weight at home 150-155 pounds. Sleeps on 2 pillows chronically. Eating chips every day.   PFTs (12/15) showed a severe restrictive defect concerning for an interstitial process.   EP study 10/15 for persistent tachycardia and found to have sinus tachycardia.   06/16/14 CT scan showed bilateral progressive lower peribronchial infiltrates with groundglass changes and nodular patter consistent with acute pneumonitis of unclear etiology.  06/20/14 BAL showed  60% macrophages and 30% PMNs. Her AFB smear, fungal smear, legionella Ab, pneumocystis smear, ACE, and sputum culture are negative. Her AFB, legionella, fungal, and BAL culture as well as her fungal Ab, hypersensitivity pneumonitis panel were all negative Serology: DsDNA, RF, ANCA, HSP all negative  ECHO  1/17 showed EF 40-45%, mild LVH, diffuse hypokinesis.  8/17 with EF improved to 50-55%, PASP 48 mmHg.   RHC 10/26/2014  RA = 2 RV = 27/1/5 PA = 29/7 (18) PCW = 4 Fick cardiac output/index = 4.5/2.2 PVR = 3.0 WU FA sat = 98% PA sat = 64%, 61%  Labs (10/15): TSH normal K 3.6, creatinine 1.1 Labs (10/31/2014) L k 3.9 Creatinine 1.06  Labs 07/23/2015: K 3.8 Creatinine 0.82  Labs (3/17): K 4, creatinine 0.6 Labs (5/17): K 4.1, creatinine 0.7 Labs (1/18): K 3.6, creatinine 0.42  Review of Systems: All systems reviewed and negative except as per HPI.   Past Medical History:  Diagnosis Date  . Anxiety   . Arthritis    rheumatoid arthritis- mild, no rheumatology care   . Asthma    seasonal allergies   . Asymptomatic LV dysfunction    a. Echo in Dec 2011 with EF 35 to 40%. Felt to be due to paced rhythm. b. EF 25-30% in 07/2014.  . Bipolar affective disorder (HCC)   . Cardiac pacemaker    a. Since age 36 in 37. b. Upgrade to BiV in 2013.  [name] Carpal tunnel syndrome of right wrist   . CHF (congestive heart failure) (HCC)   .  Congenital complete AV block   . Depression    bipolar  . Diabetes mellitus without complication (HCC)   . GERD (gastroesophageal reflux disease)   . Hypertension   . Lupus   . Lupus (systemic lupus erythematosus) (HCC)   . Obesity   . Pneumonitis    a. a/w hypoxia - inflammatory - large workup 07/2014.  . Presence of permanent cardiac pacemaker   . Seizures (HCC)    as a child- from high fever  . Sinus tachycardia     Current Outpatient Prescriptions  Medication Sig Dispense Refill  . amoxicillin-clavulanate (AUGMENTIN) 875-125 MG tablet Take 1  tablet by mouth 2 (two) times daily. 20 tablet 0  . benzonatate (TESSALON) 200 MG capsule Take 1 capsule (200 mg total) by mouth 3 (three) times daily as needed for cough. 30 capsule 1  . bisoprolol (ZEBETA) 5 MG tablet Take 0.5 tablets (2.5 mg total) by mouth daily. Take in AM 15 tablet 3  . fluocinonide (LIDEX) 0.05 % external solution 1 APPLICATION APPLY ON THE SKIN AS DIRECTED APPLY TO AFFECTED AREAS DAILY ON SCALP AS NEEDED FOR ITCHING.  5  . furosemide (LASIX) 20 MG tablet Take 1 tablet (20 mg total) by mouth every other day. 15 tablet 3  . insulin aspart (NOVOLOG) 100 UNIT/ML FlexPen Inject 8 Units into the skin 3 (three) times daily with meals. 15 mL 11  . Insulin Glargine (LANTUS SOLOSTAR) 100 UNIT/ML Solostar Pen Inject 25 Units into the skin daily. 15 mL 5  . ipratropium (ATROVENT) 0.06 % nasal spray Place 2 sprays into both nostrils 4 (four) times daily. 15 mL 1  . mycophenolate (CELLCEPT) 500 MG tablet Take 1,000 mg by mouth 2 (two) times daily.     . pantoprazole (PROTONIX) 40 MG tablet Take 1 tablet (40 mg total) by mouth daily. 30 tablet 5  . pantoprazole (PROTONIX) 40 MG tablet TAKE 1 TABLET (40 MG TOTAL) BY MOUTH DAILY. 30 tablet 2  . predniSONE (DELTASONE) 5 MG tablet Take 30 mg by mouth daily with breakfast.     . tacrolimus (PROGRAF) 1 MG capsule Take 1 mg by mouth 2 (two) times daily.    . Vitamin D, Ergocalciferol, (DRISDOL) 50000 units CAPS capsule Take 50,000 Units by mouth once a week.  1  . losartan (COZAAR) 25 MG tablet Take 0.5 tablets (12.5 mg total) by mouth every evening. (Patient not taking: Reported on 05/04/2017) 15 tablet 3   No current facility-administered medications for this encounter.     Allergies  Allergen Reactions  . 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 Sertraline Hcl Hives  . Tape Other (See Comments)    Burns skin      Social History   Social History  . Marital status:  Single    Spouse name: N/A  . Number of children: 1  . Years of education: N/A   Occupational History  . CNA    Social History Main Topics  . Smoking status: Former Smoker    Packs/day: 0.25    Years: 0.50    Types: Cigarettes    Quit date: 07/25/1996  . Smokeless tobacco: Never Used  . Alcohol use No  . Drug use: No  . Sexual activity: Yes    Birth control/ protection: Implant, IUD     Comment: placed 04/2011   Other Topics Concern  . Not on file   Social History Narrative  Works at [employer].        Family History  Problem Relation Age of Onset  . Heart disease Mother        CHF (no details)  . Hypertension Mother   . Lupus Mother   . Heart disease Father        Murmur  . Heart disease Sister 19        No details.  History of a pacemaker    Vitals:   05/04/17 1025  BP: 102/70  Pulse: 96  SpO2: 96%  Weight: 155 lb (70.3 kg)    PHYSICAL EXAM: General:  Well appearing. No resp difficulty. Ambulated in the clinic with oxygen HEENT: normal Neck: supple. JVP 9-10. Carotids 2+ bilat; no bruits. No lymphadenopathy or thryomegaly appreciated. Cor: PMI nondisplaced. Regular rate & rhythm. No rubs, gallops or murmurs. Lungs: clear. On 2 liters.  Abdomen: soft, nontender, nondistended. No hepatosplenomegaly. No bruits or masses. Good bowel sounds. Extremities: no cyanosis, clubbing, rash, edema Neuro: alert & orientedx3, cranial nerves grossly intact. moves all 4 extremities w/o difficulty. Affect pleasant    ASSESSMENT & PLAN: 1) Chronic systolic HF:  Echo 05/2014 EF 20-25%, echo 1/17 improved with EF 40-45% and echo in 8/17 with EF up to 50-55%. Most recent ECHO  EF 45-50%. Nonischemic cardiomyopathy.  Medtronic CRT-P device.   - Volume status mildly elevated. Asked her to take an extra lasix today. Suspect this is related to high salt food intake.   - Continue   bisoprolol 2.5 qam  Stop losartan.    2) Anti-synthetase syndrome with interstitial lung disease:  Suspect this also causes the persistent weakness.  Per Rheumatology.---> Cellcept and prednisone.  Also [facility]. Pulmonlogy  3) Congenital AVN block s/p pacer: Has EP followup 4) Chronic Respiratory Failure- on 2 liters oxygen chronically.Sats stable.     Follow up in 3 months. Reinforced low salt diet and limiting fluid intake to< 2 liters per day. Provided with low salt food hand out.   Island Dohmen NP_C  05/04/2017

## 2017-05-11 ENCOUNTER — Other Ambulatory Visit: Payer: Self-pay

## 2017-05-11 NOTE — Patient Outreach (Signed)
[facility]) Care Management  05/11/17  Makayla Vasquez 04/14/1981 [id number]  Successful outreach completed with patient. Patient identification verified.   Patient stated she has been doing well and has no concerns at present. Stated her only need is a blood pressure cuff at present. RNCM to assist patient with obtaining a blood pressure cuff so that she can self-monitor her blood pressures.  Plan: RNCM to assist with BP cuff and home visit scheduled for later this week to follow up with patient as previously discussed.  Turkey R. Lesley Galentine, RN, BSN, CCM [facility] Care Management Coordinator [phone]

## 2017-05-12 ENCOUNTER — Ambulatory Visit: Payer: Medicare Other | Admitting: Family Medicine

## 2017-05-13 ENCOUNTER — Encounter: Payer: Self-pay | Admitting: Student in an Organized Health Care Education/Training Program

## 2017-05-13 ENCOUNTER — Ambulatory Visit (INDEPENDENT_AMBULATORY_CARE_PROVIDER_SITE_OTHER): Payer: Medicare Other | Admitting: Student in an Organized Health Care Education/Training Program

## 2017-05-13 VITALS — BP 110/80 | HR 99 | Temp 98.1°F | Wt 154.0 lb

## 2017-05-13 DIAGNOSIS — N926 Irregular menstruation, unspecified: Secondary | ICD-10-CM

## 2017-05-13 DIAGNOSIS — N941 Unspecified dyspareunia: Secondary | ICD-10-CM | POA: Diagnosis not present

## 2017-05-13 DIAGNOSIS — E669 Obesity, unspecified: Secondary | ICD-10-CM | POA: Diagnosis not present

## 2017-05-13 DIAGNOSIS — Z114 Encounter for screening for human immunodeficiency virus [HIV]: Secondary | ICD-10-CM | POA: Diagnosis not present

## 2017-05-13 DIAGNOSIS — E1169 Type 2 diabetes mellitus with other specified complication: Secondary | ICD-10-CM

## 2017-05-13 LAB — POCT WET PREP (WET MOUNT)
CLUE CELLS WET PREP WHIFF POC: POSITIVE
TRICHOMONAS WET PREP HPF POC: ABSENT

## 2017-05-13 LAB — POCT GLYCOSYLATED HEMOGLOBIN (HGB A1C): Hemoglobin A1C: 9.7

## 2017-05-13 MED ORDER — METFORMIN HCL 500 MG PO TABS: 1000.0000 mg | ORAL_TABLET | Freq: Two times a day (BID) | ORAL | 3 refills | Status: DC

## 2017-05-13 MED ORDER — NORGESTIMATE-ETH ESTRADIOL 0.25-35 MG-MCG PO TABS: 1.0000 | ORAL_TABLET | Freq: Every day | ORAL | 11 refills | Status: DC

## 2017-05-13 NOTE — Patient Instructions (Addendum)
It was a pleasure seeing you today in our clinic. Today we discussed diabetes and vaginal bleeding. Here is the treatment plan we have discussed and agreed upon together:  - I will call you with the results from your test - we will use a oral contraceptive pill to help normalize your menstrual cycles. I sent this to your pharmacy  Diabetes Your diabetes is not well controlled Your goal is to have an A1c < 7.0, and iti s  9.7 today Medicine Changes: We added metformin to your medication regimen. - start with 500 mg daily and gradually titrate up your metformin to 1000 mg twice daily as we discussed - if your morning fasting blood glucose is less than 100 then reduce your dose to 20 units of lantus that day Homework:  Please schedule an appointment with me to go over diabetes dietary changes!   Come back to see [place] in: 3 months   Our clinic's number is [phone]. Please call with questions or concerns about what we discussed today.  Be well, Dr. Mosetta Putt

## 2017-05-13 NOTE — Progress Notes (Signed)
   CC: vaginal bleeding  HPI: Makayla Vasquez is a 36 y.o. female with PMH significant for interstitial lung disease, insulin dependent diabetes, PCOS, history of irregular menses, who presents to [facility] today irregular menses and diabetes follow up. Also requests STI screening.  Diabetes - HbA1c 9.7 from 6.8 - 25u Lantus qd - 8u Novolog TID with meals - previously was on metformin 1000 mg BID but reports this was stopped after a hospitalization - AM fasting glucose - no blurry vision - no nocturia, +polyphagia - no paresthesias - reports she has been stress eating due to recent relationship issues - denies hypoglycemic symptoms  Vaginal spotting - nexplanon x1 year, also hx of PCOS - not every day but most days - intermittent over two months - no hematochezia or melena - reports her menses have always been irregular - dyspareunia x 2 months - denies abnormal vaginal discharge, no urinary symptoms, no vaginal pruritis - patient does not smoke and denies history of clotting  Review of Symptoms:  See HPI for ROS.   CC, SH/smoking status, and VS noted.  Objective: BP 110/80   Pulse 99   Temp 98.1 F (36.7 C) (Oral)   Wt 154 lb (69.9 kg)   LMP 05/12/2017   BMI 25.63 kg/m  GEN: NAD, alert, cooperative, and pleasant. GI: soft, non-tender, non-distended, no hepatosplenomegaly SKIN: warm and dry, no rashes or lesions GU: normal appearing vagina, +blood noted from cervical os, no cervical discharge, no lesions, no cervical motion tenderness NEURO: II-XII grossly intact, normal gait, peripheral sensation intact PSYCH: AAOx3, appropriate affect  Assessment and plan:  No problem-specific Assessment & Plan notes found for this encounter.   Orders Placed This Encounter  Procedures  . RPR  . HIV antibody  . HgB A1c  . POCT Wet Prep [facility])    Meds ordered this encounter  Medications  . metFORMIN (GLUCOPHAGE) 500 MG tablet    Sig: Take 2 tablets (1,000 mg total) by  mouth 2 (two) times daily with a meal.    Dispense:  180 tablet    Refill:  3  . norgestimate-ethinyl estradiol (SPRINTEC 28) 0.25-35 MG-MCG tablet    Sig: Take 1 tablet by mouth daily.    Dispense:  1 Package    Refill:  11     Howard Pouch, MD,MS,  PGY2 05/14/2017 5:44 AM

## 2017-05-14 ENCOUNTER — Other Ambulatory Visit: Payer: Self-pay | Admitting: Student in an Organized Health Care Education/Training Program

## 2017-05-14 ENCOUNTER — Other Ambulatory Visit: Payer: Self-pay

## 2017-05-14 DIAGNOSIS — N941 Unspecified dyspareunia: Secondary | ICD-10-CM | POA: Insufficient documentation

## 2017-05-14 DIAGNOSIS — N926 Irregular menstruation, unspecified: Secondary | ICD-10-CM | POA: Insufficient documentation

## 2017-05-14 LAB — RPR: RPR Ser Ql: NONREACTIVE

## 2017-05-14 LAB — HIV ANTIBODY (ROUTINE TESTING W REFLEX): HIV Screen 4th Generation wRfx: NONREACTIVE

## 2017-05-14 MED ORDER — METRONIDAZOLE 500 MG PO TABS: 500.0000 mg | ORAL_TABLET | Freq: Two times a day (BID) | ORAL | 0 refills | Status: AC

## 2017-05-14 NOTE — Assessment & Plan Note (Signed)
-   2 months duration - STI screening today: Wet prep/ GC/Chla, HIV, RPR

## 2017-05-14 NOTE — Progress Notes (Signed)
Called patient with results of wet prep which were notable for BV. She has had this on previous wet preps. Given that she is symptomatic with dyspareunia it is reasonable to treat. Sent metronidazole to her pharmacy. She is onboard with this plan. Disclosed the HIV and RPR results which were negative.

## 2017-05-14 NOTE — Assessment & Plan Note (Signed)
-   poorly controlled with A1c 9.7 from 6.8 - restart metformin at 500 mg daily and titrate up to 1000 mg BID, patient given instructions - decrease lantus dose to 20u in the AM if fasting glucose is <100 - patient verbalizes understanding of this plan through teachback - patient to schedule f/u visit for diabetes diet counseling

## 2017-05-14 NOTE — Assessment & Plan Note (Signed)
-   hx PCOS, patient says menses have never been regular - pt with nexplanon - started OCP this visit

## 2017-05-14 NOTE — Patient Outreach (Signed)
[facility]) Care Management   05/14/2017  Makayla Vasquez 05/15/1981 [id number]  Home visit completed with patient.   Makayla Vasquez is an 36 y.o. female  Subjective: "I have been doing pretty good."  Objective:  Blood pressure 138/72, pulse 78, weight 154 lb (69.9 kg), last menstrual period 05/12/2017, SpO2 96 %.  ROS  Physical Exam  Encounter Medications:   Outpatient Encounter Prescriptions as of 05/14/2017  Medication Sig Note  . bisoprolol (ZEBETA) 5 MG tablet Take 0.5 tablets (2.5 mg total) by mouth daily. Take in AM   . furosemide (LASIX) 20 MG tablet Take 1 tablet (20 mg total) by mouth every other day.   . insulin aspart (NOVOLOG) 100 UNIT/ML FlexPen Inject 8 Units into the skin 3 (three) times daily with meals.   . Insulin Glargine (LANTUS SOLOSTAR) 100 UNIT/ML Solostar Pen Inject 25 Units into the skin daily.   [name] ipratropium (ATROVENT) 0.06 % nasal spray Place 2 sprays into both nostrils 4 (four) times daily. 01/23/2017: Takes as needed  . metFORMIN (GLUCOPHAGE) 500 MG tablet Take 2 tablets (1,000 mg total) by mouth 2 (two) times daily with a meal.   . mycophenolate (CELLCEPT) 500 MG tablet Take 1,000 mg by mouth 2 (two) times daily.    . norgestimate-ethinyl estradiol (SPRINTEC 28) 0.25-35 MG-MCG tablet Take 1 tablet by mouth daily.   . pantoprazole (PROTONIX) 40 MG tablet TAKE 1 TABLET (40 MG TOTAL) BY MOUTH DAILY.   [name] predniSONE (DELTASONE) 5 MG tablet Take 30 mg by mouth daily with breakfast.  05/14/2017: Patient taking 20 mg daily  . Vitamin D, Ergocalciferol, (DRISDOL) 50000 units CAPS capsule Take 50,000 Units by mouth once a week. 01/04/2017: Tuesday   . amoxicillin-clavulanate (AUGMENTIN) 875-125 MG tablet Take 1 tablet by mouth 2 (two) times daily. (Patient not taking: Reported on 05/14/2017)   . benzonatate (TESSALON) 200 MG capsule Take 1 capsule (200 mg total) by mouth 3 (three) times daily as needed for cough. (Patient not taking: Reported on  05/14/2017)   . fluocinonide (LIDEX) 0.05 % external solution 1 APPLICATION APPLY ON THE SKIN AS DIRECTED APPLY TO AFFECTED AREAS DAILY ON SCALP AS NEEDED FOR ITCHING.   . pantoprazole (PROTONIX) 40 MG tablet Take 1 tablet (40 mg total) by mouth daily.   . tacrolimus (PROGRAF) 1 MG capsule Take 1 mg by mouth 2 (two) times daily.    No facility-administered encounter medications on file as of 05/14/2017.     Functional Status:   In your present state of health, do you have any difficulty performing the following activities: 01/14/2017 01/05/2017  Hearing? N N  Vision? N N  Difficulty concentrating or making decisions? N N  Walking or climbing stairs? N N  Dressing or bathing? N N  Doing errands, shopping? Y N  Preparing Food and eating ? N -  Using the Toilet? N -  In the past six months, have you accidently leaked urine? Y -  Do you have problems with loss of bowel control? N -  Managing your Medications? Y -  Managing your Finances? N -  Housekeeping or managing your Housekeeping? Y -  Some recent data might be hidden    Fall/Depression Screening:    Fall Risk  03/03/2017 01/14/2017 12/15/2016  Falls in the past year? No Yes No  Number falls in past yr: - 1 -  Injury with Fall? - No -  Risk Factor Category  - High Fall Risk -  Risk  for fall due to : - History of fall(s) -  Follow up - Education provided;Falls prevention discussed -   PHQ 2/9 Scores 05/13/2017 03/03/2017 01/29/2017 01/14/2017 12/15/2016 11/11/2016 10/16/2016  PHQ - 2 Score 0 0 0 2 0 0 0  PHQ- 9 Score - - - 9 - - -    Assessment:  Patient stated she has been doing really good. She denies any shortness of breath or chest pains. She currently denies any edema in her hands, feet or ankles. Patient stated that he has been weighing and her last weight was 154 lbs.   Patient stated that her blood pressure has been a little high, but she has not been able to check it because she does not have a BP cuff. RNCM provided patient  with a BP cuff at today's visit and demonstrated how to use it. Encouraged patient to check it at least daily and as needed and write it down for her doctor. Patient verbalized understanding.  Patient has no other questions or concerns. We reviewed the heart failure zones and patient is currently in the green zone.   Patient did state that she would be moving as soon as she has money saved. She stated that she has found a new place, but is trying to get the money to make the down payment. RNCM encouraged her to let RNCM know if she has a change of address and she verbalized understanding.  Plan: Patient to check her blood pressure daily and RNCM will follow up with patient within next month.  Encouraged patient to call with any concerns or needs and she verbalized understanding.  Turkey R. Troyce Gieske, RN, BSN, CCM [facility] Care Management Coordinator [phone]

## 2017-05-15 ENCOUNTER — Encounter: Payer: Self-pay | Admitting: Student in an Organized Health Care Education/Training Program

## 2017-05-15 ENCOUNTER — Other Ambulatory Visit: Payer: Self-pay | Admitting: Student in an Organized Health Care Education/Training Program

## 2017-05-15 DIAGNOSIS — E1169 Type 2 diabetes mellitus with other specified complication: Secondary | ICD-10-CM

## 2017-05-15 DIAGNOSIS — E669 Obesity, unspecified: Principal | ICD-10-CM

## 2017-05-15 LAB — GC/CHLAMYDIA PROBE AMP (~~LOC~~) NOT AT ARMC
Chlamydia: NEGATIVE
NEISSERIA GONORRHEA: NEGATIVE

## 2017-05-18 ENCOUNTER — Other Ambulatory Visit: Payer: Self-pay | Admitting: Family Medicine

## 2017-05-18 IMAGING — DX DG CHEST 2V
2 series · 2 of 2 positions shown · non-contrast
Comparison: PA and lateral chest x-ray February 09, 2015

CLINICAL DATA: One week of left shoulder pain ; history of asthma,
heart block, CHF, and diabetes.

EXAM:
CHEST  2 VIEW

[chest pa]
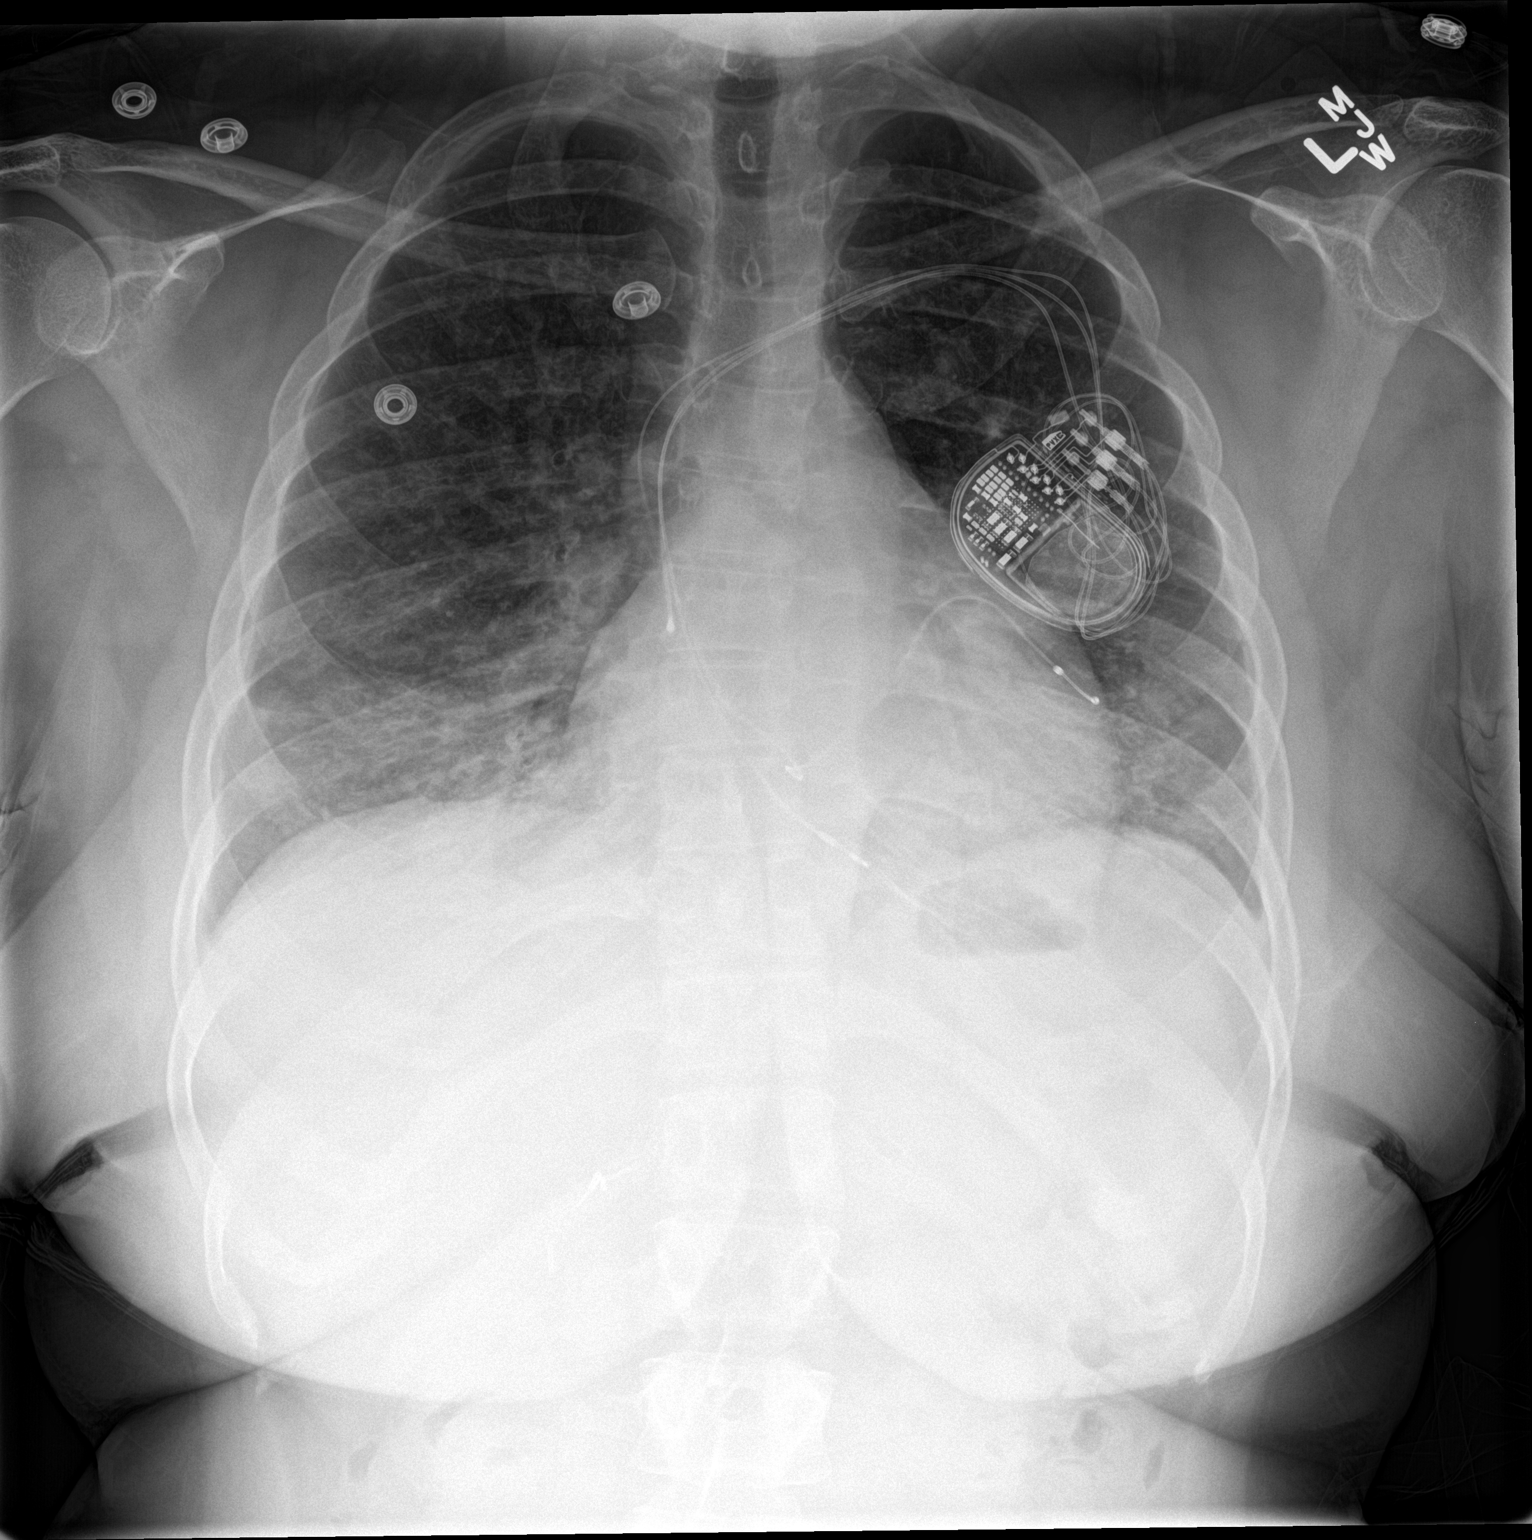

[chest lat]
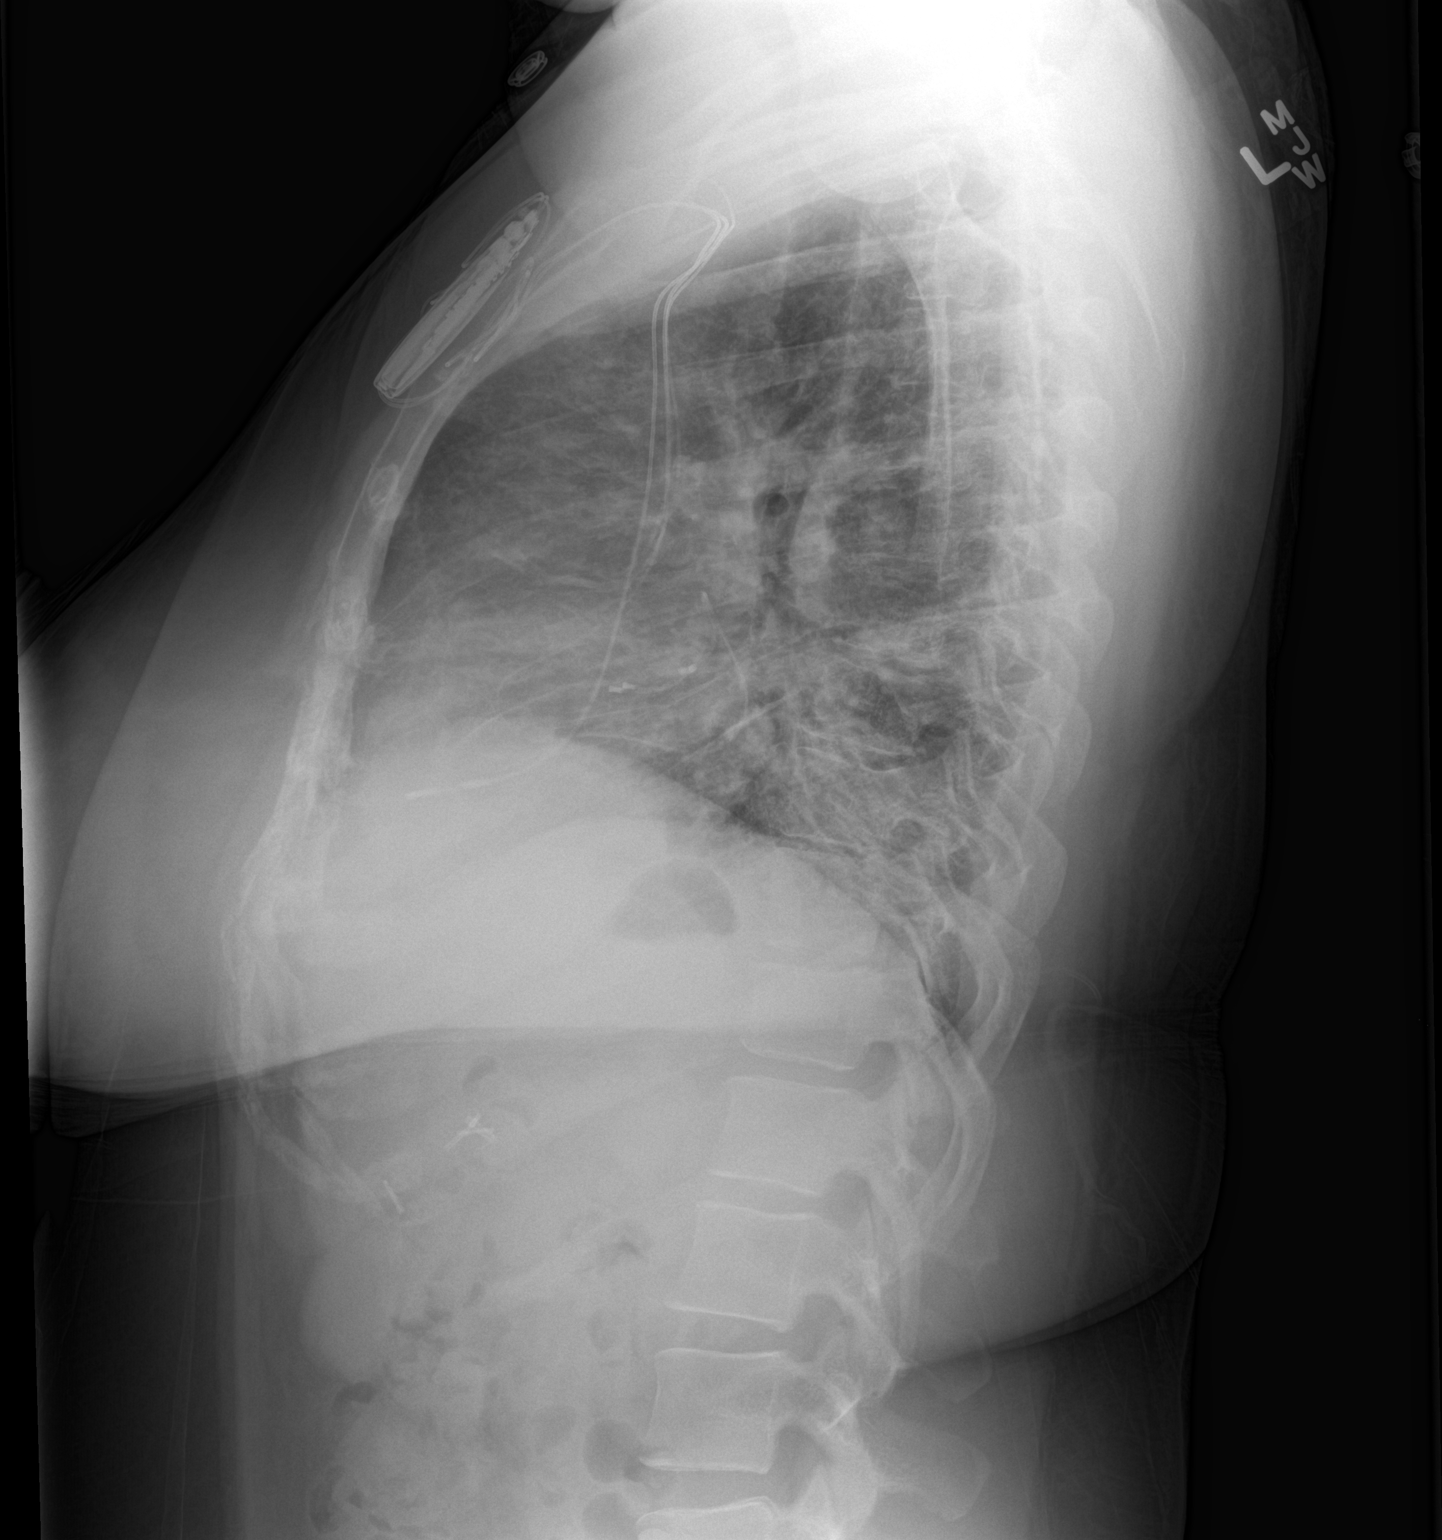

[2 of 2 positions shown; findings below may reference images not displayed]

FINDINGS: The lungs are adequately inflated. The interstitial markings are
increased bilaterally but are relatively stable. The cardiac
silhouette is top-normal in size. The central pulmonary vascularity
is prominent though stable. The permanent pacemaker is unchanged in
appearance. There is no pleural effusion.
IMPRESSION: Chronically increased interstitial markings are again demonstrated.
There are slightly more conspicuous today. There may be superimposed
low-grade CHF. There is no alveolar pneumonia.

## 2017-05-19 ENCOUNTER — Other Ambulatory Visit: Payer: Self-pay | Admitting: Cardiology

## 2017-05-26 ENCOUNTER — Ambulatory Visit: Payer: Medicare Other | Admitting: Internal Medicine

## 2017-05-28 ENCOUNTER — Ambulatory Visit: Payer: Medicare Other | Admitting: Obstetrics & Gynecology

## 2017-06-10 ENCOUNTER — Ambulatory Visit: Payer: Self-pay

## 2017-06-16 ENCOUNTER — Other Ambulatory Visit: Payer: Self-pay

## 2017-06-16 NOTE — Patient Outreach (Incomplete)
[facility]) Care Management  06/16/17  Makayla Vasquez 15-Jan-1981 [id number]  Successful outreach completed with patient.

## 2017-06-26 ENCOUNTER — Encounter: Payer: Self-pay | Admitting: Internal Medicine

## 2017-06-26 ENCOUNTER — Ambulatory Visit (INDEPENDENT_AMBULATORY_CARE_PROVIDER_SITE_OTHER): Payer: Medicare Other | Admitting: Internal Medicine

## 2017-06-26 VITALS — BP 132/84 | HR 92 | Temp 98.6°F | Ht 65.0 in | Wt 160.6 lb

## 2017-06-26 DIAGNOSIS — B9789 Other viral agents as the cause of diseases classified elsewhere: Secondary | ICD-10-CM | POA: Diagnosis not present

## 2017-06-26 DIAGNOSIS — J069 Acute upper respiratory infection, unspecified: Secondary | ICD-10-CM | POA: Diagnosis present

## 2017-06-26 MED ORDER — FLUTICASONE PROPIONATE 50 MCG/ACT NA SUSP: 2.0000 | Freq: Every day | NASAL | 6 refills | Status: DC

## 2017-06-26 MED ORDER — AMOXICILLIN-POT CLAVULANATE 875-125 MG PO TABS: 1.0000 | ORAL_TABLET | Freq: Two times a day (BID) | ORAL | 0 refills | Status: DC

## 2017-06-26 MED ORDER — LORATADINE 10 MG PO TABS: 10.0000 mg | ORAL_TABLET | Freq: Every day | ORAL | 11 refills | Status: DC

## 2017-06-26 NOTE — Patient Instructions (Addendum)
I have sent in Augmentin to your pharmacy. Start taking this antibiotic if you are not feeling better in 3 days. I have also prescribed Claritin and Flonase which can help with your congestion. I would recommend warm liquids and a use of a humidifier as well. Follow up if symptoms do not improve with these treatments or if you develop fevers, significant SOB, etc.

## 2017-06-26 NOTE — Progress Notes (Signed)
Subjective:    Makayla Vasquez - 36 y.o. female MRN [id number]  Date of birth: 05/28/1981  HPI  Makayla Vasquez is here for cough and congestion. Reports about 7 days of significant nasal congestion and clear drainage. She has also developed a cough and has sensation of stuff dripping down the back of her throat. She reports a lot of facial pressure across her forehead and cheeks. She has a history of allergies, was previously using Claritin, but has not been taking medication recently. She is on 3L O2 for ILD. No increase in oxygen requirement. No fever, chills, SOB, chest pain, nausea or vomiting. Denies sick contact.    -  reports that she quit smoking about 20 years ago. Her smoking use included Cigarettes. She has a 0.12 pack-year smoking history. She has never used smokeless tobacco. - Review of Systems: Per HPI. - Past Medical History: Patient Active Problem List   Diagnosis Date Noted  . Dyspareunia, female 05/14/2017  . Irregular menses 05/14/2017  . 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 On Cellcept therapy   . On prednisone therapy   . Systemic lupus erythematosus (HCC)   . Long term (current) use of systemic steroids   . Chronic diastolic congestive heart failure (HCC)   . Anemia of chronic disease   . Bipolar affective disorder in remission (HCC)   . Cardiac pacemaker in situ   . Adrenal insufficiency (HCC)   . Antisynthetase syndrome (HCC)   . Idiopathic hypotension 07/08/2015  . Hyperlipidemia associated with type 2 diabetes mellitus (HCC) 05/04/2015  . Diabetes mellitus type 2 in obese (HCC)   . Chronic respiratory failure with hypoxia (HCC) 08/07/2014  . Insertion of implantable subdermal contraceptive 07/12/2014  . ILD (interstitial lung disease) (HCC) 06/19/2014  . Congestive dilated cardiomyopathy (HCC) 05/31/2014  . Chronic systolic heart failure (HCC) 02/26/2012  . PPM-Medtronic   . Congenital complete AV block   . GERD (gastroesophageal reflux disease)   .  THYROGLOSSAL DUCT CYST 07/18/2010  . POLYCYSTIC OVARY 12/17/2006   - Medications: reviewed and updated   Objective:   Physical Exam BP 132/84   Pulse 92   Temp 98.6 F (37 C) (Oral)   Ht 5\' 5"  (1.651 m)   Wt 160 lb 9.6 oz (72.8 kg)   SpO2 94%   BMI 26.73 kg/m  Gen: NAD, alert, cooperative with exam HEENT: NCAT, PERRL, clear conjunctiva, oropharynx erythematous without tonsillar exudates or hypertrophy, nasal turbinates edematous and erythematous, supple neck, TMs normal bilaterally, TTP over frontal sinuses  CV: RRR, good S1/S2, no murmur, no edema, capillary refill brisk  Resp: CTABL, no wheezes, non-labored, on 3L O2 by [place] (baseline), speaks in full sentences, good air movement throughout     Assessment & Plan:   1. Viral URI with cough Suspect viral URI with postnasal drip as cause of symptoms. However, patient may be developing acute bacterial sinusitis given time course with worsening of symptoms and frontal sinus tenderness. Have recommended she start treatment with abx if not improving in 3 days. Uncontrolled allergic rhinitis may also be contributing and will treat with anti-histamine and nasal steroid. Have also recommended conservative managements such as hot beverages and humidifier. Doubt CAP given clear lung exam, afebrile, and no increase in oxygen requirement from baseline. Return precautions discussed.  - amoxicillin-clavulanate (AUGMENTIN) 875-125 MG tablet; Take 1 tablet by mouth 2 (two) times daily.  Dispense: 20 tablet; Refill: 0 - fluticasone (FLONASE) 50 MCG/ACT nasal spray; Place 2 sprays into  both nostrils daily.  Dispense: 16 g; Refill: 6 - loratadine (CLARITIN) 10 MG tablet; Take 1 tablet (10 mg total) by mouth daily.  Dispense: 30 tablet; Refill: 11   [name], D.O. 06/26/2017, 1:52 PM PGY-3, [facility]

## 2017-07-07 ENCOUNTER — Other Ambulatory Visit: Payer: Self-pay | Admitting: Family Medicine

## 2017-07-08 ENCOUNTER — Ambulatory Visit: Payer: Medicare Other | Admitting: *Deleted

## 2017-07-09 ENCOUNTER — Telehealth (HOSPITAL_COMMUNITY): Payer: Self-pay | Admitting: *Deleted

## 2017-07-09 DIAGNOSIS — E119 Type 2 diabetes mellitus without complications: Secondary | ICD-10-CM | POA: Diagnosis not present

## 2017-07-09 DIAGNOSIS — Z794 Long term (current) use of insulin: Secondary | ICD-10-CM | POA: Diagnosis not present

## 2017-07-09 NOTE — Telephone Encounter (Signed)
Advanced Heart Failure Triage Encounter  Patient Name: Makayla Vasquez   Date of Call: 07/09/2017  Problem:  Patient called c/o cramps in hands. Patient was taken off potassium last year during hospital stay. Last K-3.6 (04/02/17). Patient wants to restart potassium.   Plan:  Scheduled lab visit tomorrow to check K. Routed to [place] for review. Patient aware we will determine course of action based off of labs tomorrow.   Modesta Messing, CMA

## 2017-07-10 ENCOUNTER — Ambulatory Visit (HOSPITAL_COMMUNITY)
Admission: RE | Admit: 2017-07-10 | Discharge: 2017-07-10 | Disposition: A | Discharge status: Home / Self Care | Payer: Medicare Other | Source: Ambulatory Visit | Attending: Cardiology | Admitting: Cardiology

## 2017-07-10 ENCOUNTER — Telehealth (HOSPITAL_COMMUNITY): Payer: Self-pay | Admitting: *Deleted

## 2017-07-10 DIAGNOSIS — I5032 Chronic diastolic (congestive) heart failure: Secondary | ICD-10-CM

## 2017-07-10 LAB — BRAIN NATRIURETIC PEPTIDE: B NATRIURETIC PEPTIDE 5: 44 pg/mL (ref 0.0–100.0)

## 2017-07-10 LAB — BASIC METABOLIC PANEL
Anion gap: 8 (ref 5–15)
BUN: 10 mg/dL (ref 6–20)
CO2: 25 mmol/L (ref 22–32)
CREATININE: 0.69 mg/dL (ref 0.44–1.00)
Calcium: 9.1 mg/dL (ref 8.9–10.3)
Chloride: 106 mmol/L (ref 101–111)
GFR calc non Af Amer: >60 mL/min (ref >60)
Glucose, Bld: 147 mg/dL — ABNORMAL HIGH (ref 65–99)
POTASSIUM: 3.8 mmol/L (ref 3.5–5.1)
SODIUM: 139 mmol/L (ref 135–145)

## 2017-07-10 MED ORDER — POTASSIUM CHLORIDE CRYS ER 20 MEQ PO TBCR: 20.0000 meq | EXTENDED_RELEASE_TABLET | Freq: Every day | ORAL | 3 refills | Status: DC

## 2017-07-10 NOTE — Telephone Encounter (Signed)
-----   Message from Laurey Morale, MD sent at 07/10/2017  1:44 PM EDT ----- Labs ok

## 2017-07-15 ENCOUNTER — Ambulatory Visit (INDEPENDENT_AMBULATORY_CARE_PROVIDER_SITE_OTHER): Payer: Medicare Other | Admitting: Internal Medicine

## 2017-07-15 ENCOUNTER — Ambulatory Visit (HOSPITAL_COMMUNITY)
Admission: RE | Admit: 2017-07-15 | Discharge: 2017-07-15 | Disposition: A | Discharge status: Home / Self Care | Payer: Medicare Other | Source: Ambulatory Visit | Attending: Family Medicine | Admitting: Family Medicine

## 2017-07-15 ENCOUNTER — Encounter: Payer: Self-pay | Admitting: Internal Medicine

## 2017-07-15 VITALS — BP 118/74 | HR 102 | Temp 98.2°F | Ht 65.0 in | Wt 157.0 lb

## 2017-07-15 DIAGNOSIS — R05 Cough: Secondary | ICD-10-CM | POA: Insufficient documentation

## 2017-07-15 DIAGNOSIS — Z79899 Other long term (current) drug therapy: Secondary | ICD-10-CM | POA: Diagnosis not present

## 2017-07-15 DIAGNOSIS — J984 Other disorders of lung: Secondary | ICD-10-CM | POA: Diagnosis not present

## 2017-07-15 DIAGNOSIS — M3320 Polymyositis, organ involvement unspecified: Secondary | ICD-10-CM | POA: Diagnosis not present

## 2017-07-15 DIAGNOSIS — Z950 Presence of cardiac pacemaker: Secondary | ICD-10-CM | POA: Diagnosis not present

## 2017-07-15 DIAGNOSIS — R0981 Nasal congestion: Secondary | ICD-10-CM

## 2017-07-15 DIAGNOSIS — R059 Cough, unspecified: Secondary | ICD-10-CM

## 2017-07-15 MED ORDER — BENZONATATE 200 MG PO CAPS: 200.0000 mg | ORAL_CAPSULE | Freq: Three times a day (TID) | ORAL | 1 refills | Status: DC | PRN

## 2017-07-15 NOTE — Assessment & Plan Note (Addendum)
Significantly immunocompromised individual,currently on 4L - states she uses between 2- 4L of oxygen normally. Patient was ambulated on 4 L maintaining sats btw 94-100 with exertion. Given patient does not feel much improved since 9/7 after seeing Dr.Wallace and taking Augmentin for sinus infection, given rhonchi heard in left chest - will obtain chest xray, CBC, BMET to rule out pneumonia. Other likely causes of ongoing congestion include seasonal allergies causing post-nasal drip/irritation from the nasal cannula. Patient confirms that nasal cannula does cause irritation. Hx of CHF thought no signs of fluid overload on exam/ takes lasix's as prescribed. Less likely concern for blood clot, given lack of leg swelling and more likely infectious/allergic symptoms  - Encourage patient to really try the Claritin as discussed previously to see if this helps with symptoms  - Encouraged patient to follow up with pulmonlogist in the near future to discuss possibly treatment for [place] irritation - Patient to follow up in 1-2 days at our clinic  - Discussed red flags for patient to go to ED

## 2017-07-15 NOTE — Patient Instructions (Signed)
Going to get some labs on oh that hopefully I will have him back in a couple of hours. I want you to go over to the hospital immediately to get a chest x-ray completed. I want you to follow-up in 1- 2 days. Please also discuss with your pulmonologist, it may be that the oxygen is causing irritation. I will let you know the results of your labs as soon as possible

## 2017-07-15 NOTE — Progress Notes (Signed)
   [facility] Noralee Chars, MD Phone: [phone]  Reason For Visit: Follow up for Sinus Congestion and Cough    # Patient with pmhx significant for SLE, ILD, CHF presenting with continued sinus infection. Patient has been having sinus congestion, with runny nose. She states she has a cough that is much worse for patient. Patient indicates sputum production which is clear. Patient received Augmentin from Dr. Earlene Plater and took this without issue. However she states that her symptoms still have not improved that much. Patient took Claritin per Dr. Philis Pique prescription, however only took 1 day of medication. She was also prescribe flonase but did not take that as she states it makes her nasal passages burn. Patient has been taking her lasix. Patient denies any hx of blood clots. States she only has chest pain when she coughs. Patient denies any fevers or chills, nause or vomiting. She states she has been feeling more fatigued then normal for the past couple of months. Patient has not seen her Pulmonlogist in about 6 months as she missed her appointment.   Past Medical History Reviewed problem list.  Medications- reviewed and updated No additions to family history Social history- patient is a non smoker  Objective: BP 118/74   Pulse (!) 102   Temp 98.2 F (36.8 C) (Oral)   Ht 5\' 5"  (1.651 m)   Wt 157 lb (71.2 kg)   SpO2 100% Comment: on 4 liters  BMI 26.13 kg/m  Gen: NAD, alert, cooperative with exam HEENT: Normal    Neck: No masses palpated. No lymphadenopathy    Ears: Tympanic membranes intact, normal light reflex, no erythema, no bulging    Eyes: PERRLA, EOMI    Nose: nasal turbinates with nasal congestion    Throat: post-nasal drip/irriation noted.  Cardio: regular rate and rhythm, S1S2 heard, no murmurs appreciated Pulm: clear to auscultation bilaterally, slight rhonchi noted on left side  Extremities: warm, well perfused, No edema, cyanosis or clubbing;      Assessment/Plan: See problem based a/p  Congestion of nasal sinus Significantly immunocompromised individual,currently on 4L - states she uses between 2- 4L of oxygen normally. Patient was ambulated on 4 L maintaining sats btw 94-100 with exertion. Given patient does not feel much improved since 9/7 after seeing Dr.Wallace and taking Augmentin for sinus infection, given rhonchi heard in left chest - will obtain chest xray, CBC, BMET to rule out pneumonia. Other likely causes of ongoing congestion include seasonal allergies causing post-nasal drip/irritation from the nasal cannula. Patient confirms that nasal cannula does cause irritation. Hx of CHF thought no signs of fluid overload on exam/ takes lasix's as prescribed. Less likely concern for blood clot, given lack of leg swelling and more likely infectious/allergic symptoms  - Encourage patient to really try the Claritin as discussed previously to see if this helps with symptoms  - Encouraged patient to follow up with pulmonlogist in the near future to discuss possibly treatment for [place] irritation - Patient to follow up in 1-2 days at our clinic  - Discussed red flags for patient to go to ED

## 2017-07-16 ENCOUNTER — Telehealth: Payer: Self-pay | Admitting: Internal Medicine

## 2017-07-16 LAB — BASIC METABOLIC PANEL
BUN/Creatinine Ratio: 14 (ref 9–23)
BUN: 9 mg/dL (ref 6–20)
CALCIUM: 9.2 mg/dL (ref 8.7–10.2)
CHLORIDE: 100 mmol/L (ref 96–106)
CO2: 25 mmol/L (ref 20–29)
Creatinine, Ser: 0.66 mg/dL (ref 0.57–1.00)
GFR calc Af Amer: 131 mL/min/{1.73_m2} (ref >59)
GFR calc non Af Amer: 113 mL/min/{1.73_m2} (ref >59)
GLUCOSE: 113 mg/dL — AB (ref 65–99)
POTASSIUM: 4.2 mmol/L (ref 3.5–5.2)
Sodium: 138 mmol/L (ref 134–144)

## 2017-07-16 LAB — CBC WITH DIFFERENTIAL/PLATELET
BASOS ABS: 0 10*3/uL (ref 0.0–0.2)
Basos: 0 %
EOS (ABSOLUTE): 0.1 10*3/uL (ref 0.0–0.4)
Eos: 2 %
Hematocrit: 35.4 % (ref 34.0–46.6)
Hemoglobin: 11.8 g/dL (ref 11.1–15.9)
LYMPHS ABS: 1.3 10*3/uL (ref 0.7–3.1)
Lymphs: 26 %
MCH: 25.5 pg — ABNORMAL LOW (ref 26.6–33.0)
MCHC: 33.3 g/dL (ref 31.5–35.7)
MCV: 77 fL — ABNORMAL LOW (ref 79–97)
MONOS ABS: 0.4 10*3/uL (ref 0.1–0.9)
Monocytes: 7 %
Neutrophils Absolute: 3.4 10*3/uL (ref 1.4–7.0)
Neutrophils: 65 %
Platelets: 369 10*3/uL (ref 150–379)
RBC: 4.62 x10E6/uL (ref 3.77–5.28)
RDW: 16.1 % — ABNORMAL HIGH (ref 12.3–15.4)
WBC: 5.2 10*3/uL (ref 3.4–10.8)

## 2017-07-16 NOTE — Telephone Encounter (Signed)
The patient today she states that she has improved significantly. Chest x-ray and lab work came back completely normal. She is continuing to have cough, however has improved. Discussed with patient to continue Claritin. Follow-up tomorrow for reevaluation unless she feels completely well

## 2017-07-30 ENCOUNTER — Encounter (HOSPITAL_COMMUNITY): Payer: Medicare Other

## 2017-07-30 DIAGNOSIS — M3320 Polymyositis, organ involvement unspecified: Secondary | ICD-10-CM | POA: Diagnosis not present

## 2017-07-30 DIAGNOSIS — Z79899 Other long term (current) drug therapy: Secondary | ICD-10-CM | POA: Diagnosis not present

## 2017-07-30 IMAGING — CR DG CHEST 2V
2 series · 2 of 2 positions shown · non-contrast
Comparison: 04/25/2015, multiple priors

CLINICAL DATA: Productive cough and shortness of breath for several
days.

EXAM:
CHEST  2 VIEW

[chest ap]
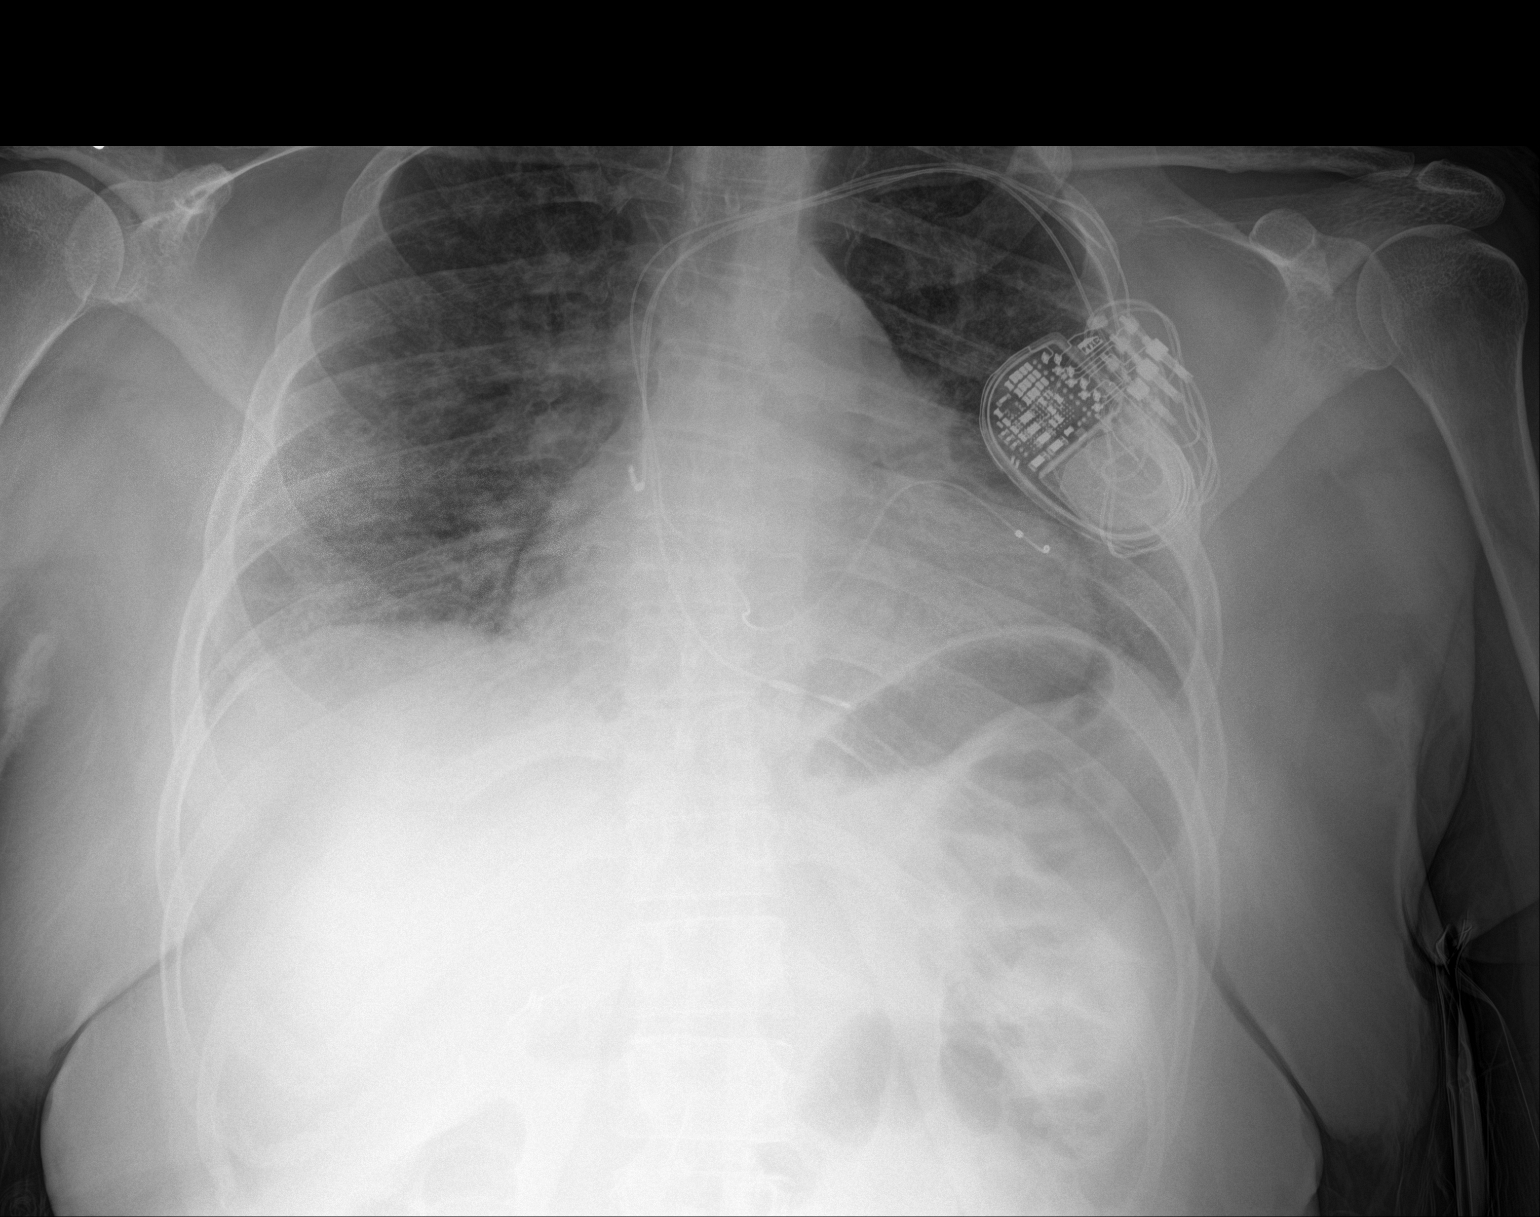

[chest lat]
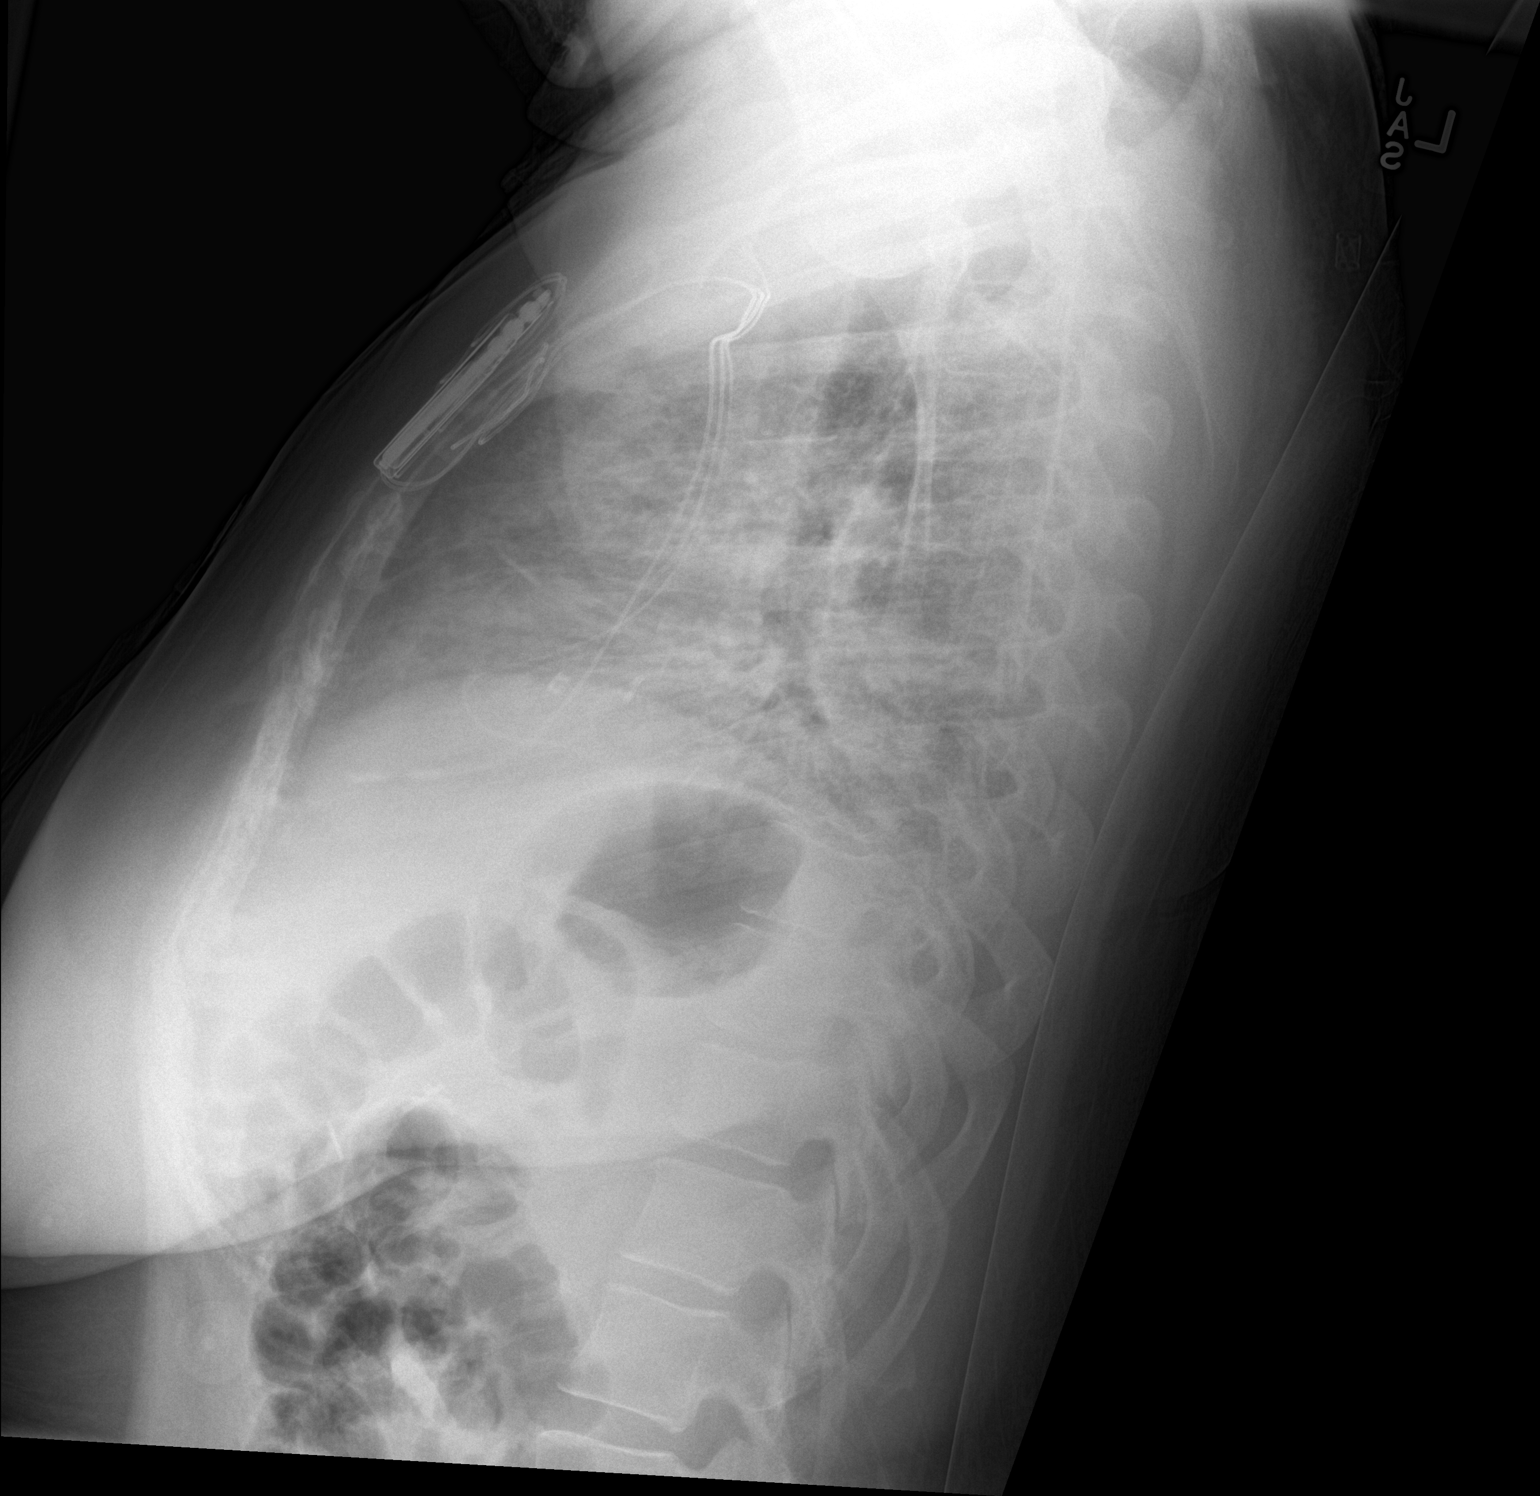

[2 of 2 positions shown; findings below may reference images not displayed]

FINDINGS: Multi lead left-sided pacemaker remains in place. Mild cardiomegaly,
increased from prior. Chronically increased interstitial markings
are again seen, question progressed from prior exam. No confluent
airspace disease, pleural effusion, or pneumothorax.
IMPRESSION: Chronic increased interstitial markings, however slightly progressed
from prior exam. Increased mild cardiomegaly. CHF superimposed on
chronic lung disease is considered.

## 2017-07-31 ENCOUNTER — Encounter: Payer: Self-pay | Admitting: *Deleted

## 2017-07-31 IMAGING — CT CT ANGIO CHEST
2 of 9 series · 18 of 46 positions shown · IV contrast (APPLIED)
Comparison: 07/07/2015, 06/16/2014

CLINICAL DATA: Shortness of breath, Chest pain, and Cough for the
last 2 weeks. D-dimer is 2.68. History of hypertension and pacemaker

EXAM:
CT ANGIOGRAPHY CHEST WITH CONTRAST
TECHNIQUE: Multidetector CT imaging of the chest was performed using the
standard protocol during bolus administration of intravenous
contrast. Multiplanar CT image reconstructions and MIPs were
obtained to evaluate the vascular anatomy.
CONTRAST:  80mL OMNIPAQUE IOHEXOL 350 MG/ML SOLN

[Series 6: thins · axial · 0.59mm/px · z∈[+1134,+1354]mm · 15 of 243 slices shown]
[im 12/243  lung]
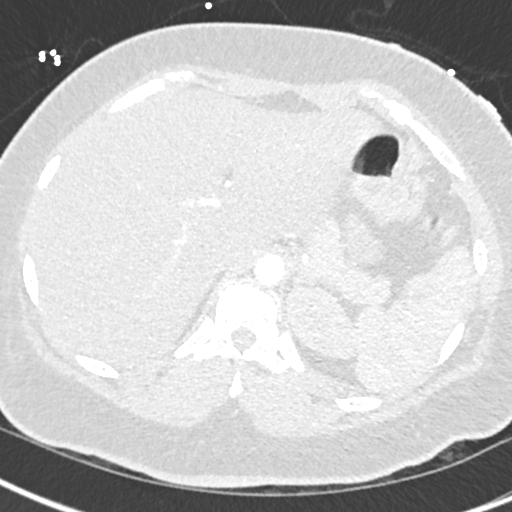
[im 34/243  soft-tissue]
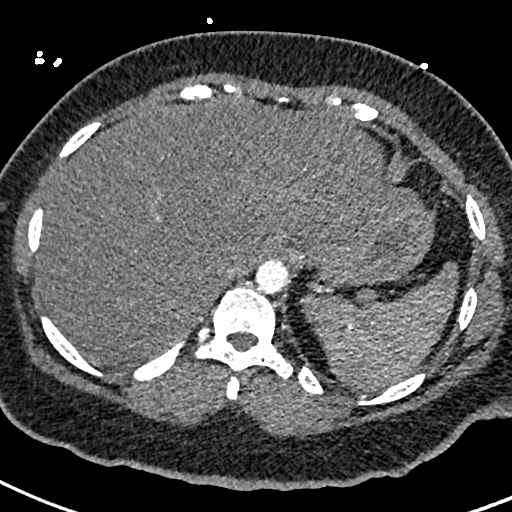
[im 45/243  lung]
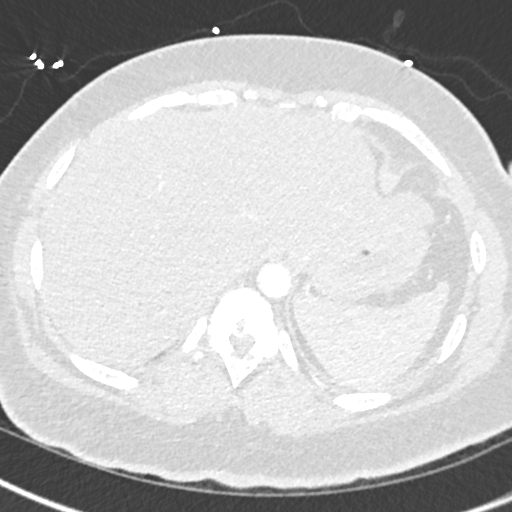
[im 56/243  soft-tissue]
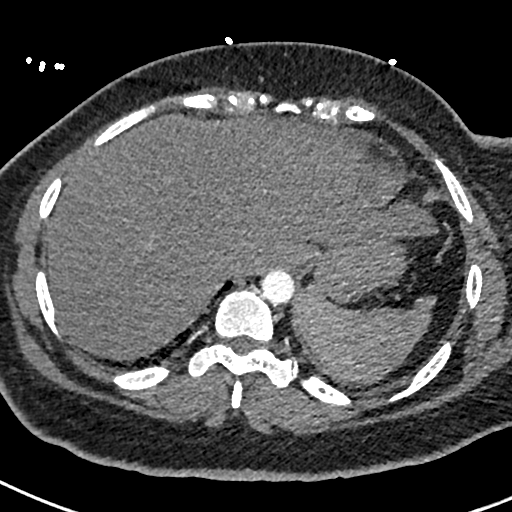
[im 78/243  lung]
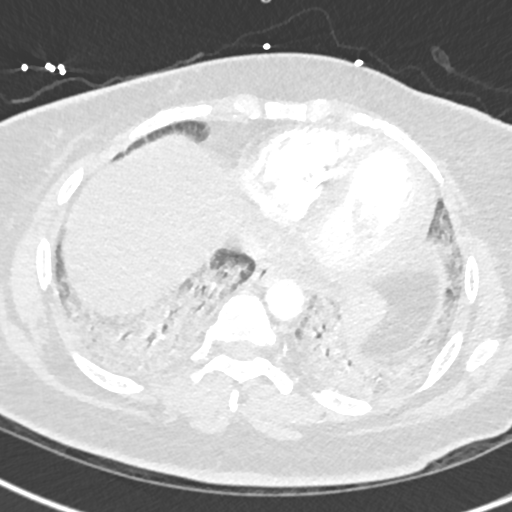
[im 89/243  soft-tissue]
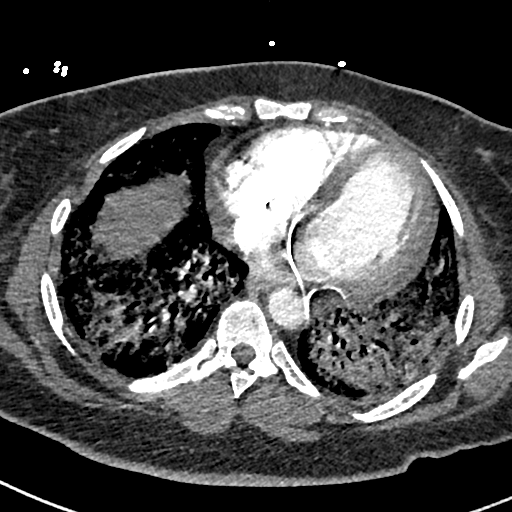
[im 111/243  lung]
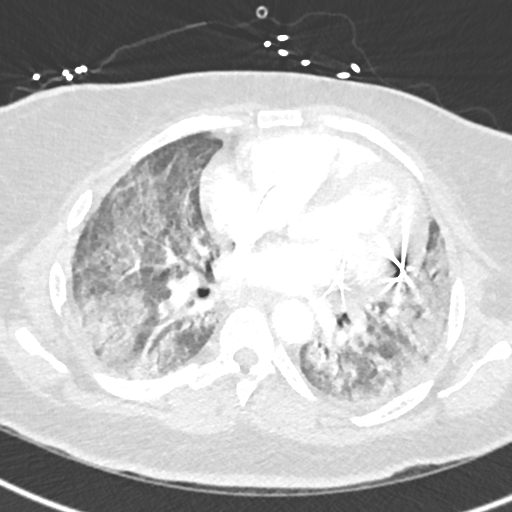
[im 122/243  soft-tissue]
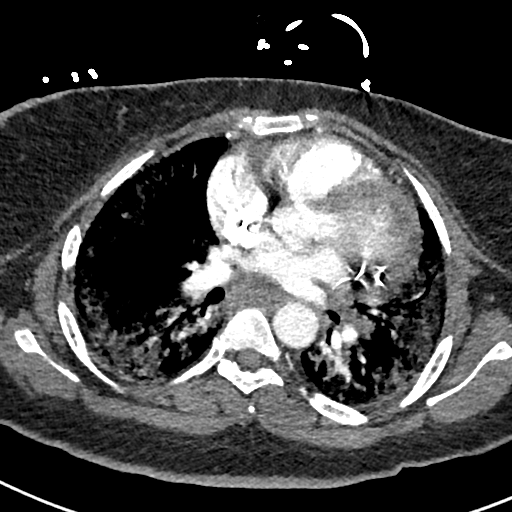
[im 133/243  lung]
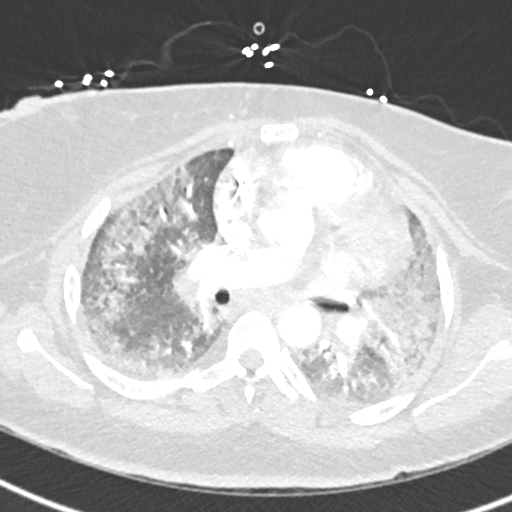
[im 155/243  soft-tissue]
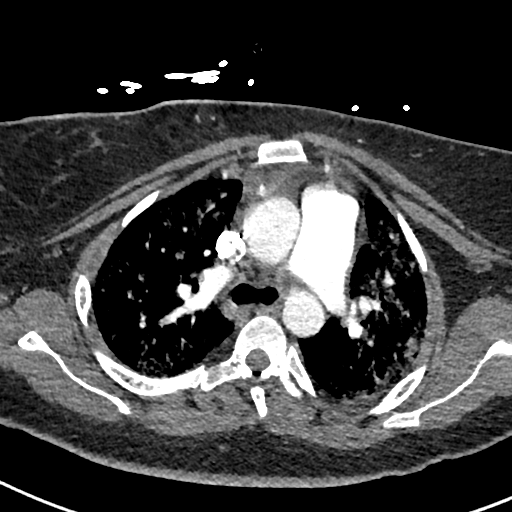
[im 166/243  lung]
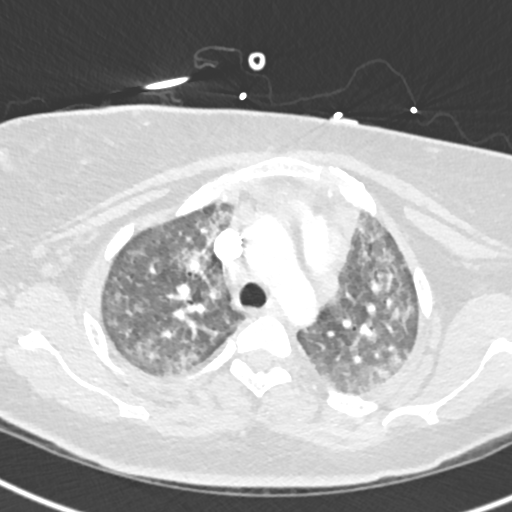
[im 188/243  soft-tissue]
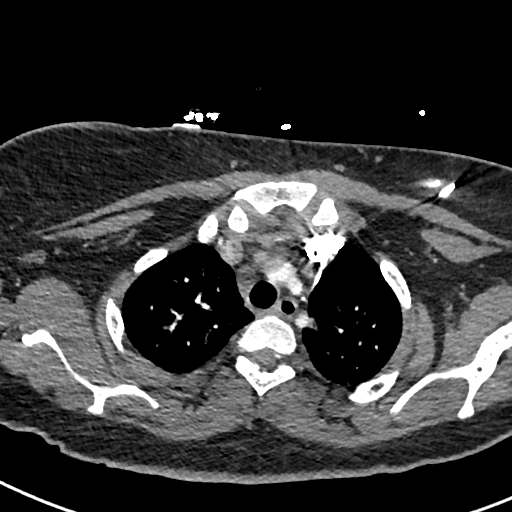
[im 199/243  lung]
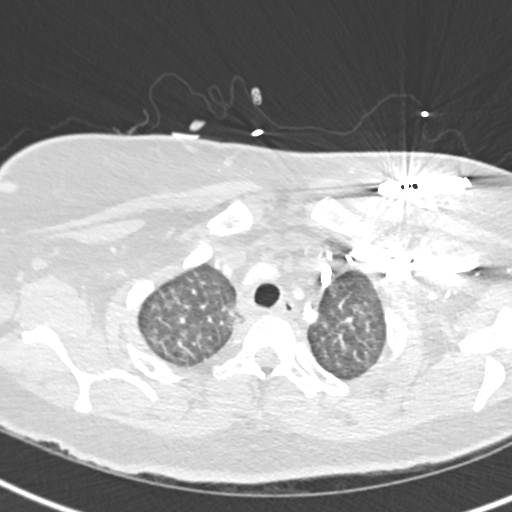
[im 210/243  soft-tissue]
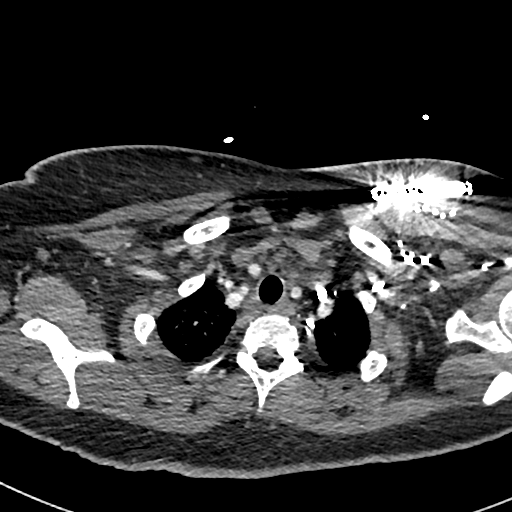
[im 232/243  lung]
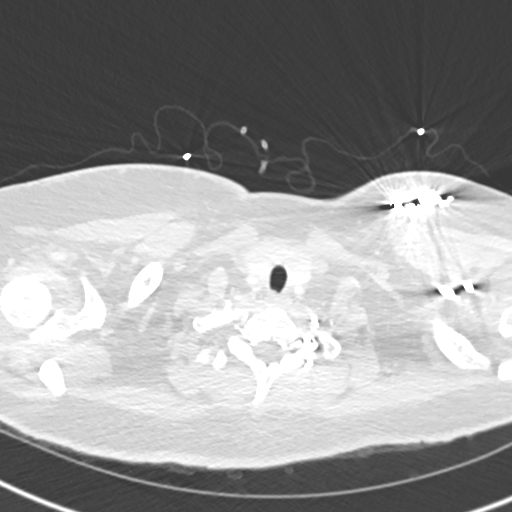

[Series 8: coronal mpr · coronal · 0.50mm/px · 3 of 114 slices shown]
[im 29/114  soft-tissue]
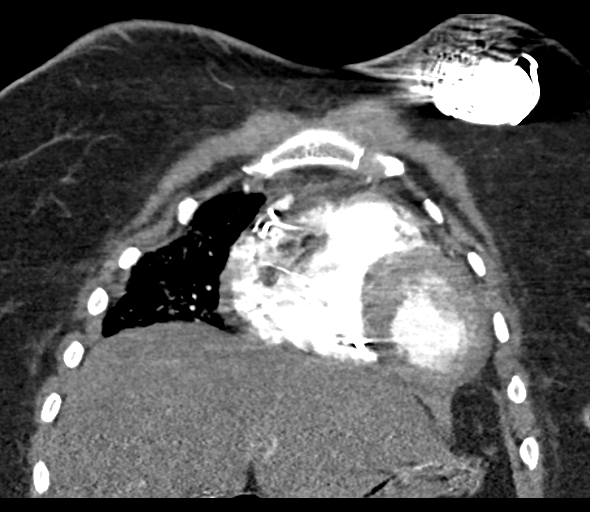
[im 57/114  soft-tissue]
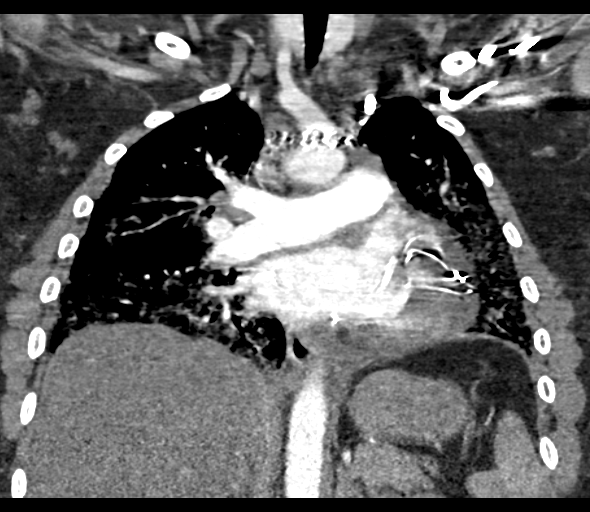
[im 85/114  soft-tissue]
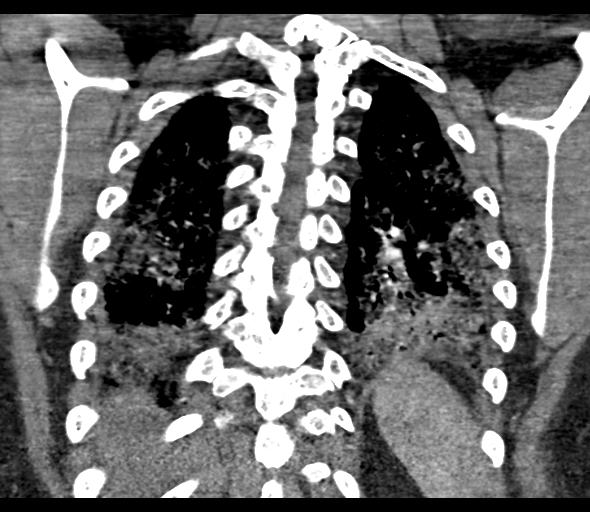

[18 of 46 positions shown; findings below may reference images not displayed]

FINDINGS: Mediastinum/Nodes: Cardiac enlargement. Minimal pericardial
thickening or effusion. No filling defects in the pulmonary arterial
system. Evaluation of the third order branches in the bilateral
lower lobes is limited by both patient body habitus and significant
lower lobe consolidation. No evidence of aortic dissection or
dilatation. Cardiac pacer noted.

No defined lymph nodes identified but there is soft tissue right
hilar and sub carinal fullness which may relate to lymphoid tissue.

Lungs/Pleura: Diffuse severe ground-glass attenuation relatively
bilaterally symmetric, arm most severe in the lower lobes and
relatively sparing the upper lobes. Air bronchograms extend from the
perihilar regions to the periphery. There is at most trace pleural
fluid

Upper abdomen: Partially visualized small upper pole left renal
calculus

Musculoskeletal: No acute findings

Review of the MIP images confirms the above findings.
IMPRESSION: Study is limited by patient body habitus and by bilateral lower lobe
consolidation. No pulmonary emboli detected but evaluation of third
order branches particularly in the lower lobes is limited.

Severe bilaterally symmetric ground-glass consolidation. No
significant pleural effusion. Appearance is similar to 06/16/2014.
The apparent chronic nature and absence of pleural fluid suggests
that this may not be due to pulmonary edema. Consider possibility of
chronic lung disease including various entities related to
interstitial pneumonitis as well as autoimmune disorders.

## 2017-08-02 ENCOUNTER — Ambulatory Visit (HOSPITAL_COMMUNITY)
Admission: EM | Admit: 2017-08-02 | Discharge: 2017-08-02 | Disposition: A | Discharge status: Home / Self Care | Payer: Medicare Other | Attending: Internal Medicine | Admitting: Internal Medicine

## 2017-08-02 ENCOUNTER — Encounter (HOSPITAL_COMMUNITY): Payer: Self-pay | Admitting: Emergency Medicine

## 2017-08-02 DIAGNOSIS — J069 Acute upper respiratory infection, unspecified: Secondary | ICD-10-CM | POA: Diagnosis not present

## 2017-08-02 DIAGNOSIS — B9789 Other viral agents as the cause of diseases classified elsewhere: Secondary | ICD-10-CM | POA: Diagnosis not present

## 2017-08-02 MED ORDER — IPRATROPIUM-ALBUTEROL 0.5-2.5 (3) MG/3ML IN SOLN
RESPIRATORY_TRACT | Status: AC
  Filled 2017-08-02: qty 3

## 2017-08-02 MED ORDER — PREDNISONE 20 MG PO TABS: 60.0000 mg | ORAL_TABLET | Freq: Every day | ORAL | 0 refills | Status: DC

## 2017-08-02 MED ORDER — BENZONATATE 200 MG PO CAPS: 200.0000 mg | ORAL_CAPSULE | Freq: Three times a day (TID) | ORAL | 1 refills | Status: DC | PRN

## 2017-08-02 MED ORDER — IPRATROPIUM-ALBUTEROL 0.5-2.5 (3) MG/3ML IN SOLN
3.0000 mL | Freq: Once | RESPIRATORY_TRACT | Status: AC
  Administered 2017-08-02: 3 mL via RESPIRATORY_TRACT

## 2017-08-02 NOTE — Discharge Instructions (Addendum)
No danger signs on exam today.  No clear cut indication for antibiotics.  Most bothersome symptom is the coughing spasms.  Prescription for benzonatate cough perles sent to the pharmacy with a single dose of prednisone 60 mg.  DuoNeb breathing treatment given at the urgent care today.  Please follow up with your primary care provider in a couple of days to discuss next steps.

## 2017-08-02 NOTE — ED Triage Notes (Signed)
Pt here for URI sx with cough and congestion; pt has home O2 for interstitial lung disease at 2L/min

## 2017-08-02 NOTE — ED Provider Notes (Signed)
[facility]    CSN: [id number] Arrival date & time: 08/02/17  1206     History   Chief Complaint Chief Complaint  Patient presents with  . Cough  . URI    HPI Makayla Vasquez is a 36 y.o. female. She presents today with irritated coughing spells, sometimes coughing so hard that she throws up. Little bit of runny nose, head congestion.  No fever.  No headache, some bodyaches including chest achiness, sometimes with coughing.  Does not want to go to ED for further evaluation of chest achiness.  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  Other dx include lupus and DM.   Had a CXR for similar sx's in late 06/2017 which was unremarkable.       HPI  Past Medical History:  Diagnosis Date  . Anxiety   . Arthritis    rheumatoid arthritis- mild, no rheumatology care   . Asthma    seasonal allergies   . Asymptomatic LV dysfunction    a. Echo in Dec 2011 with EF 35 to 40%. Felt to be due to paced rhythm. b. EF 25-30% in 07/2014.  . Bipolar affective disorder (HCC)   . Cardiac pacemaker    a. Since age 40 in 74. b. Upgrade to BiV in 2013.  [name] Carpal tunnel syndrome of right wrist   . CHF (congestive heart failure) (HCC)   . Congenital complete AV block   . Depression    bipolar  . Diabetes mellitus without complication (HCC)   . GERD (gastroesophageal reflux disease)   . Hypertension   . Lupus   . Lupus (systemic lupus erythematosus) (HCC)   . Obesity   . Pneumonitis    a. a/w hypoxia - inflammatory - large workup 07/2014.  . Presence of permanent cardiac pacemaker   . Seizures (HCC)    as a child- from high fever  . Sinus tachycardia     Patient Active Problem List   Diagnosis Date Noted  . Congestion of nasal sinus 07/15/2017  . Dyspareunia, female 05/14/2017  . Irregular menses 05/14/2017  . 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 On Cellcept therapy   . On prednisone therapy   .  Systemic lupus erythematosus (HCC)   . Long term (current) use of systemic steroids   . Chronic diastolic congestive heart failure (HCC)   . Cough   . Anemia of chronic disease   . Bipolar affective disorder in remission (HCC)   . Cardiac pacemaker in situ   . Adrenal insufficiency (HCC)   . Antisynthetase syndrome (HCC)   . Idiopathic hypotension 07/08/2015  . Hyperlipidemia associated with type 2 diabetes mellitus (HCC) 05/04/2015  . Diabetes mellitus type 2 in obese (HCC)   . Chronic respiratory failure with hypoxia (HCC) 08/07/2014  . Insertion of implantable subdermal contraceptive 07/12/2014  . ILD (interstitial lung disease) (HCC) 06/19/2014  . Congestive dilated cardiomyopathy (HCC) 05/31/2014  . Chronic systolic heart failure (HCC) 02/26/2012  . PPM-Medtronic   . Congenital complete AV block   . GERD (gastroesophageal reflux disease)   . THYROGLOSSAL DUCT CYST 07/18/2010  . POLYCYSTIC OVARY 12/17/2006    Past Surgical History:  Procedure Laterality Date  . ATRIAL TACH ABLATION N/A 08/14/2014   Procedure: ATRIAL TACH ABLATION;  Surgeon: Marinus Maw, MD;  Location: [facility] CATH LAB;  Service: Cardiovascular;  Laterality: N/A;  . BI-VENTRICULAR PACEMAKER UPGRADE N/A 03/08/2012   Procedure: BI-VENTRICULAR  PACEMAKER UPGRADE;  Surgeon: Marinus Maw, MD;  Location: [facility] CATH LAB;  Service: Cardiovascular;  Laterality: N/A;  . CARPAL TUNNEL WITH CUBITAL TUNNEL Right 07/26/2013   Procedure: RIGHT LIMITED OPEN CARPAL TUNNEL RELEASE ,  RIGHT CUBITAL TUNNEL RELEASE, INSITU VERSES ULNAR NERVE DECOMPRESSION AND ANTERIOR TRANSPOSITION;  Surgeon: Dominica Severin, MD;  Location: [facility] OR;  Service: Orthopedics;  Laterality: Right;  . CESAREAN SECTION    . CHOLECYSTECTOMY    . CYST EXCISION  12/10/2012   THYROID  . INSERT / REPLACE / REMOVE PACEMAKER     2001  . IUD REMOVAL  11/03/2011   Procedure: INTRAUTERINE DEVICE (IUD) REMOVAL;  Surgeon: Myra C. Marice Potter, MD;  Location: [facility] ORS;  Service:  Gynecology;  Laterality: N/A;  . NASAL FRACTURE SURGERY     /w plate   . RIGHT HEART CATHETERIZATION N/A 10/26/2014   Procedure: RIGHT HEART CATH;  Surgeon: Dolores Patty, MD;  Location: [facility] CATH LAB;  Service: Cardiovascular;  Laterality: N/A;  . THROAT SURGERY  1994   s/p laser treatment  . THYROGLOSSAL DUCT CYST N/A 12/10/2012   Procedure: REVISION OF THYROGLOSSAL DUCT CYST EXCISION;  Surgeon: Serena Colonel, MD;  Location: [facility] OR;  Service: ENT;  Laterality: N/A;  Revision of Thyroglossal Duct Cyst Excision  . VIDEO BRONCHOSCOPY Bilateral 06/19/2014   Procedure: VIDEO BRONCHOSCOPY WITHOUT FLUORO;  Surgeon: Kalman Shan, MD;  Location: [facility] ENDOSCOPY;  Service: Cardiopulmonary;  Laterality: Bilateral;    OB History    Gravida Para Term Preterm AB Living   1 1 1     1    SAB TAB Ectopic Multiple Live Births                   Home Medications    Prior to Admission medications   Medication Sig Start Date End Date Taking? Authorizing Provider  amoxicillin-clavulanate (AUGMENTIN) 875-125 MG tablet Take 1 tablet by mouth 2 (two) times daily. 06/26/17   Arvilla Market, DO  benzonatate (TESSALON) 200 MG capsule Take 1 capsule (200 mg total) by mouth 3 (three) times daily as needed for cough. 08/02/17   Eustace Moore, MD  bisoprolol (ZEBETA) 5 MG tablet Take 0.5 tablets (2.5 mg total) by mouth daily. Take in AM 03/23/17   Little Ishikawa, NP  fluocinonide (LIDEX) 0.05 % external solution 1 APPLICATION APPLY ON THE SKIN AS DIRECTED APPLY TO AFFECTED AREAS DAILY ON SCALP AS NEEDED FOR ITCHING. 09/28/15   [provider]  fluticasone (FLONASE) 50 MCG/ACT nasal spray Place 2 sprays into both nostrils daily. 06/26/17   Arvilla Market, DO  furosemide (LASIX) 20 MG tablet TAKE 1 TABLET BY MOUTH EVERY DAY 05/15/17   Marquette Saa, MD  insulin aspart (NOVOLOG) 100 UNIT/ML FlexPen Inject 8 Units into the skin 3 (three) times daily with meals. 09/27/16   Renne Musca, MD  ipratropium (ATROVENT) 0.06 % nasal spray Place 2 sprays into both nostrils 4 (four) times daily. 10/25/16   Linna Hoff, MD  LANTUS SOLOSTAR 100 UNIT/ML Solostar Pen INJECT 15 UNITS SUBCUTANEOUSLY ONCE DAILY 05/15/17   Marquette Saa, MD  loratadine (CLARITIN) 10 MG tablet Take 1 tablet (10 mg total) by mouth daily. 06/26/17   Arvilla Market, DO  metFORMIN (GLUCOPHAGE) 500 MG tablet Take 2 tablets (1,000 mg total) by mouth 2 (two) times daily with a meal. 05/13/17   Howard Pouch, MD  mycophenolate (CELLCEPT) 500 MG tablet Take 1,000 mg by mouth 2 (two)  times daily.     [provider]  norgestimate-ethinyl estradiol (SPRINTEC 28) 0.25-35 MG-MCG tablet Take 1 tablet by mouth daily. 05/13/17   Howard Pouch, MD  pantoprazole (PROTONIX) 40 MG tablet Take 1 tablet (40 mg total) by mouth daily. 04/07/16   Karamalegos, Netta Neat, DO  pantoprazole (PROTONIX) 40 MG tablet TAKE 1 TABLET (40 MG TOTAL) BY MOUTH DAILY. 03/13/17   Marquette Saa, MD  pantoprazole (PROTONIX) 40 MG tablet TAKE 1 TABLET (40MG  TOTAL) BY MOUTH DAILY 05/15/17   05/17/17, MD  potassium chloride SA (K-DUR,KLOR-CON) 20 MEQ tablet Take 1 tablet (20 mEq total) by mouth daily. 07/10/17   07/12/17, MD  predniSONE (DELTASONE) 5 MG tablet Take 30 mg by mouth daily with breakfast.     [provider]  tacrolimus (PROGRAF) 1 MG capsule Take 1 mg by mouth 2 (two) times daily. 01/26/17   Ang, 03/28/17, MD  Vitamin D, Ergocalciferol, (DRISDOL) 50000 units CAPS capsule Take 50,000 Units by mouth once a week. 09/27/16   [provider]    Family History Family History  Problem Relation Age of Onset  . Heart disease Mother        CHF (no details)  . Hypertension Mother   . Lupus Mother   . Heart disease Father        Murmur  . Heart disease Sister 40        No details.  History of a pacemaker    Social History Social History  Substance Use Topics  .  Smoking status: Former Smoker    Packs/day: 0.25    Years: 0.50    Types: Cigarettes    Quit date: 07/25/1996  . Smokeless tobacco: Never Used  . Alcohol use No     Allergies   Atorvastatin; Sertraline hcl; and Tape   Review of Systems Review of Systems  All other systems reviewed and are negative.    Physical Exam Triage Vital Signs ED Triage Vitals  Enc Vitals Group     BP 08/02/17 1230 127/75     Pulse Rate 08/02/17 1230 (!) 59     Resp 08/02/17 1230 18     Temp 08/02/17 1230 98.2 F (36.8 C)     Temp src --      SpO2 08/02/17 1230 100 %     Weight --      Height --      Pain Score 08/02/17 1231 6     Pain Loc --    Updated Vital Signs BP 127/75 (BP Location: Right Arm)   Pulse (!) 59   Temp 98.2 F (36.8 C)   Resp 18   SpO2 100%   Physical Exam  Constitutional: She is oriented to person, place, and time. No distress.  The patient appears chronically Sitting up on the end of the exam table, wearing home O2  HENT:  Head: Atraumatic.  Bilateral TMs are minimally dull/translucent, no erythema No nasal congestion, the nasal mucosal surfaces are somewhat eroded-appearing, and the patient says this is chronic Throat is pink, with some clear postnasal drainage evident  Eyes:  Conjugate gaze observed, no eye redness/discharge  Neck: Neck supple.  Cardiovascular: Normal rate and regular rhythm.   Pulmonary/Chest: No respiratory distress. She has no wheezes. She has no rales.  Coarse but symmetric breath sounds throughout Not coughing during exam  Abdominal: She exhibits no distension.  Musculoskeletal: Normal range of motion. She exhibits no edema.  Neurological: She  is alert and oriented to person, place, and time.  Skin: Skin is warm and dry.  Nursing note and vitals reviewed.    UC Treatments / Results   Procedures Procedures (including critical care time)  Medications Ordered in UC Medications  ipratropium-albuterol (DUONEB) 0.5-2.5 (3) MG/3ML  nebulizer solution 3 mL (3 mLs Nebulization Given 08/02/17 1302)     Final Clinical Impressions(s) / UC Diagnoses   Final diagnoses:  Viral URI with cough   No danger signs on exam today.  No clear cut indication for antibiotics.  Most bothersome symptom is the coughing spasms.  Prescription for benzonatate cough perles sent to the pharmacy with a single dose of prednisone 60 mg.  DuoNeb breathing treatment given at the urgent care today.  Please follow up with your primary care provider in a couple of days to discuss next steps.  New Prescriptions Meds ordered this encounter  Medications  . benzonatate (TESSALON) 200 MG capsule    Sig: Take 1 capsule (200 mg total) by mouth 3 (three) times daily as needed for cough.    Dispense:  30 capsule    Refill:  1  . predniSONE (DELTASONE) 20 MG tablet    Sig: Take 3 tablets (60 mg total) by mouth daily.    Dispense:  3 tablet    Refill:  0     Controlled Substance Prescriptions [place] Controlled Substance Registry consulted? No   Eustace Moore, MD 08/04/17 [phone]

## 2017-08-03 ENCOUNTER — Encounter (HOSPITAL_COMMUNITY): Payer: Medicare Other

## 2017-08-06 DIAGNOSIS — R29898 Other symptoms and signs involving the musculoskeletal system: Secondary | ICD-10-CM | POA: Diagnosis not present

## 2017-08-06 DIAGNOSIS — R0602 Shortness of breath: Secondary | ICD-10-CM | POA: Diagnosis not present

## 2017-08-06 DIAGNOSIS — G729 Myopathy, unspecified: Secondary | ICD-10-CM | POA: Diagnosis not present

## 2017-08-06 DIAGNOSIS — R748 Abnormal levels of other serum enzymes: Secondary | ICD-10-CM | POA: Diagnosis not present

## 2017-08-06 DIAGNOSIS — R05 Cough: Secondary | ICD-10-CM | POA: Diagnosis not present

## 2017-08-06 DIAGNOSIS — Z79899 Other long term (current) drug therapy: Secondary | ICD-10-CM | POA: Diagnosis not present

## 2017-08-06 DIAGNOSIS — D8989 Other specified disorders involving the immune mechanism, not elsewhere classified: Secondary | ICD-10-CM | POA: Diagnosis not present

## 2017-08-06 DIAGNOSIS — J849 Interstitial pulmonary disease, unspecified: Secondary | ICD-10-CM | POA: Diagnosis not present

## 2017-08-07 ENCOUNTER — Telehealth: Payer: Self-pay | Admitting: Internal Medicine

## 2017-08-07 NOTE — Telephone Encounter (Signed)
Pt called because she needs a urgent referral to have a mammogram done. She said that she needs to be screened for cancer. jw

## 2017-08-10 NOTE — Telephone Encounter (Signed)
Patient has appointment scheduled for tomorrow with PCP

## 2017-08-11 ENCOUNTER — Ambulatory Visit: Payer: Medicare Other | Admitting: Internal Medicine

## 2017-08-12 ENCOUNTER — Other Ambulatory Visit: Payer: Self-pay | Admitting: Family Medicine

## 2017-08-13 ENCOUNTER — Other Ambulatory Visit: Payer: Self-pay | Admitting: Internal Medicine

## 2017-08-14 ENCOUNTER — Ambulatory Visit (HOSPITAL_COMMUNITY)
Admission: RE | Admit: 2017-08-14 | Discharge: 2017-08-14 | Disposition: A | Discharge status: Home / Self Care | Payer: Medicare Other | Source: Ambulatory Visit | Attending: Internal Medicine | Admitting: Internal Medicine

## 2017-08-14 ENCOUNTER — Encounter (HOSPITAL_COMMUNITY): Payer: Self-pay

## 2017-08-14 VITALS — BP 116/74 | HR 113 | Wt 153.6 lb

## 2017-08-14 DIAGNOSIS — J9611 Chronic respiratory failure with hypoxia: Secondary | ICD-10-CM | POA: Diagnosis not present

## 2017-08-14 DIAGNOSIS — Z8249 Family history of ischemic heart disease and other diseases of the circulatory system: Secondary | ICD-10-CM | POA: Insufficient documentation

## 2017-08-14 DIAGNOSIS — Q246 Congenital heart block: Secondary | ICD-10-CM

## 2017-08-14 DIAGNOSIS — Z888 Allergy status to other drugs, medicaments and biological substances status: Secondary | ICD-10-CM | POA: Insufficient documentation

## 2017-08-14 DIAGNOSIS — Z79899 Other long term (current) drug therapy: Secondary | ICD-10-CM | POA: Insufficient documentation

## 2017-08-14 DIAGNOSIS — I5022 Chronic systolic (congestive) heart failure: Secondary | ICD-10-CM

## 2017-08-14 DIAGNOSIS — J849 Interstitial pulmonary disease, unspecified: Secondary | ICD-10-CM

## 2017-08-14 DIAGNOSIS — E119 Type 2 diabetes mellitus without complications: Secondary | ICD-10-CM | POA: Diagnosis not present

## 2017-08-14 DIAGNOSIS — E669 Obesity, unspecified: Secondary | ICD-10-CM | POA: Insufficient documentation

## 2017-08-14 DIAGNOSIS — Z87891 Personal history of nicotine dependence: Secondary | ICD-10-CM | POA: Diagnosis not present

## 2017-08-14 DIAGNOSIS — K219 Gastro-esophageal reflux disease without esophagitis: Secondary | ICD-10-CM | POA: Insufficient documentation

## 2017-08-14 DIAGNOSIS — Z6841 Body Mass Index (BMI) 40.0 and over, adult: Secondary | ICD-10-CM | POA: Insufficient documentation

## 2017-08-14 DIAGNOSIS — R000 Tachycardia, unspecified: Secondary | ICD-10-CM | POA: Diagnosis not present

## 2017-08-14 DIAGNOSIS — I110 Hypertensive heart disease with heart failure: Secondary | ICD-10-CM | POA: Insufficient documentation

## 2017-08-14 DIAGNOSIS — F319 Bipolar disorder, unspecified: Secondary | ICD-10-CM | POA: Diagnosis not present

## 2017-08-14 DIAGNOSIS — J9610 Chronic respiratory failure, unspecified whether with hypoxia or hypercapnia: Secondary | ICD-10-CM | POA: Insufficient documentation

## 2017-08-14 DIAGNOSIS — Z950 Presence of cardiac pacemaker: Secondary | ICD-10-CM | POA: Diagnosis not present

## 2017-08-14 DIAGNOSIS — Z794 Long term (current) use of insulin: Secondary | ICD-10-CM | POA: Diagnosis not present

## 2017-08-14 DIAGNOSIS — M329 Systemic lupus erythematosus, unspecified: Secondary | ICD-10-CM | POA: Diagnosis not present

## 2017-08-14 NOTE — Patient Instructions (Addendum)
No changes at this time.  Follow up 6 months with echocardiogram and Dr. Shirlee Latch. We will call you closer to this time, or you may call our office to schedule 1 month before you are due to be seen.  Take all medication as prescribed the day of your appointment. Bring all medications with you to your appointment.  Do the following things EVERYDAY: 1) Weigh yourself in the morning before breakfast. Write it down and keep it in a log. 2) Take your medicines as prescribed 3) Eat low salt foods-Limit salt (sodium) to 2000 mg per day.  4) Stay as active as you can everyday 5) Limit all fluids for the day to less than 2 liters

## 2017-08-14 NOTE — Progress Notes (Addendum)
Patient ID: Makayla Vasquez, female   DOB: 04-Oct-1981, 36 y.o.   MRN: [id number] PCP: Dr Natale Milch Cardiology: Dr Shirlee Latch Pulmonary: Dr Ang Lifescape and Dr Maple Hudson.   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   She has a h/o congenital HB and underwent first PPM at age 85. In 2011 had ECHO with EF 35-40%. LV dysfunction felt to be due to RV pacing so upgraded to CRT-P. Echo in 10/15 with EF 25%.  She also has concomitant lung disease and this has limited b-blocker dosing.  She follows with Dr. Maple Hudson for her lung disease.Admitted in 8/15 and 9/15 with worsening respiratory status and severe hypoxia. Had extensive w/u. Thought to have a viral pneumonitis complicated by atelectasis and mild edema vs. an autoimmune process. Discharged home on a course of levaquin and prednisone.   She was offered a lung biopsy for further diagnosis versus empiric treatment with steroids. She opted for empiric steroid treatment. She is followed by a rheumatologist and is on Cellcept and prednisone taper. 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  She is on oxygen and night and with activity.   Returns today for HF follow up. She is feeling ok, tells me that yesterday she was diagnosed with the flu. She is not on Tamiflu as she was told he was 48 hours out from symptoms. She is not SOB with walking around her home. She denies orthopnea, PND and chest pain. She has not taken her medications today.   PFTs (12/15) showed a severe restrictive defect concerning for an interstitial process.   EP study 10/15 for persistent tachycardia and found to have sinus tachycardia.   06/16/14 CT scan showed bilateral progressive lower peribronchial infiltrates with groundglass changes and nodular patter consistent with acute pneumonitis of unclear etiology.  06/20/14 BAL showed 60%  macrophages and 30% PMNs. Her AFB smear, fungal smear, legionella Ab, pneumocystis smear, ACE, and sputum culture are negative. Her AFB, legionella, fungal, and BAL culture as well as her fungal Ab, hypersensitivity pneumonitis panel were all negative Serology: DsDNA, RF, ANCA, HSP all negative  ECHO  1/17 showed EF 40-45%, mild LVH, diffuse hypokinesis.  8/17 with EF improved to 50-55%, PASP 48 mmHg.   RHC 10/26/2014  RA = 2 RV = 27/1/5 PA = 29/7 (18) PCW = 4 Fick cardiac output/index = 4.5/2.2 PVR = 3.0 WU FA sat = 98% PA sat = 64%, 61%  Labs (10/15): TSH normal K 3.6, creatinine 1.1 Labs (10/31/2014) L k 3.9 Creatinine 1.06  Labs 07/23/2015: K 3.8 Creatinine 0.82  Labs (3/17): K 4, creatinine 0.6 Labs (5/17): K 4.1, creatinine 0.7 Labs (1/18): K 3.6, creatinine 0.42  Review of Systems: All systems reviewed and negative except as per HPI.   Past Medical History:  Diagnosis Date  . Anxiety   . Arthritis    rheumatoid arthritis- mild, no rheumatology care   . Asthma    seasonal allergies   . Asymptomatic LV dysfunction    a. Echo in Dec 2011 with EF 35 to 40%. Felt to be due to paced rhythm. b. EF 25-30% in 07/2014.  . Bipolar affective disorder (HCC)   . Cardiac pacemaker    a. Since age 13 in 52. b. Upgrade to BiV in 2013.  [name] Carpal tunnel syndrome of right wrist   . CHF (congestive heart failure) (HCC)   .  Congenital complete AV block   . Depression    bipolar  . Diabetes mellitus without complication (HCC)   . GERD (gastroesophageal reflux disease)   . Hypertension   . Lupus   . Lupus (systemic lupus erythematosus) (HCC)   . Obesity   . Pneumonitis    a. a/w hypoxia - inflammatory - large workup 07/2014.  . Presence of permanent cardiac pacemaker   . Seizures (HCC)    as a child- from high fever  . Sinus tachycardia     Current Outpatient Prescriptions  Medication Sig Dispense Refill  . amoxicillin-clavulanate (AUGMENTIN) 875-125 MG tablet Take 1 tablet  by mouth 2 (two) times daily. 20 tablet 0  . benzonatate (TESSALON) 200 MG capsule Take 1 capsule (200 mg total) by mouth 3 (three) times daily as needed for cough. 30 capsule 1  . bisoprolol (ZEBETA) 5 MG tablet Take 0.5 tablets (2.5 mg total) by mouth daily. Take in AM 15 tablet 3  . EASY TOUCH PEN NEEDLES 31G X 8 MM MISC USE AS DIRECTED 100 each 11  . fluocinonide (LIDEX) 0.05 % external solution 1 APPLICATION APPLY ON THE SKIN AS DIRECTED APPLY TO AFFECTED AREAS DAILY ON SCALP AS NEEDED FOR ITCHING.  5  . fluticasone (FLONASE) 50 MCG/ACT nasal spray Place 2 sprays into both nostrils daily. 16 g 6  . fluticasone (FLONASE) 50 MCG/ACT nasal spray INSTILL TWO SPRAYS INTO BOTH NOSTRILS DAILY. 16 g 5  . furosemide (LASIX) 20 MG tablet TAKE 1 TABLET BY MOUTH EVERY DAY 90 tablet 3  . insulin aspart (NOVOLOG) 100 UNIT/ML FlexPen Inject 8 Units into the skin 3 (three) times daily with meals. 15 mL 11  . ipratropium (ATROVENT) 0.06 % nasal spray Place 2 sprays into both nostrils 4 (four) times daily. 15 mL 1  . LANTUS SOLOSTAR 100 UNIT/ML Solostar Pen INJECT 15 UNITS SUBCUTANEOUSLY ONCE DAILY 15 mL 3  . loratadine (CLARITIN) 10 MG tablet Take 1 tablet (10 mg total) by mouth daily. 30 tablet 11  . metFORMIN (GLUCOPHAGE) 500 MG tablet Take 2 tablets (1,000 mg total) by mouth 2 (two) times daily with a meal. 180 tablet 3  . mycophenolate (CELLCEPT) 500 MG tablet Take 1,000 mg by mouth 2 (two) times daily.     . norgestimate-ethinyl estradiol (SPRINTEC 28) 0.25-35 MG-MCG tablet Take 1 tablet by mouth daily. 1 Package 11  . pantoprazole (PROTONIX) 40 MG tablet TAKE 1 TABLET (40MG  TOTAL) BY MOUTH DAILY 90 tablet 3  . potassium chloride SA (K-DUR,KLOR-CON) 20 MEQ tablet Take 1 tablet (20 mEq total) by mouth daily. 30 tablet 3  . predniSONE (DELTASONE) 20 MG tablet Take 3 tablets (60 mg total) by mouth daily. 3 tablet 0  . predniSONE (DELTASONE) 5 MG tablet Take 30 mg by mouth daily with breakfast.     .  tacrolimus (PROGRAF) 1 MG capsule Take 1.5 mg by mouth 2 (two) times daily.     . Vitamin D, Ergocalciferol, (DRISDOL) 50000 units CAPS capsule Take 50,000 Units by mouth once a week.  1   No current facility-administered medications for this encounter.     Allergies  Allergen Reactions  . 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 Sertraline Hcl Hives  . Tape Other (See Comments)    Burns skin      Social History   Social History  . Marital status: Single    Spouse name: N/A  .  Number of children: 1  . Years of education: N/A   Occupational History  . CNA    Social History Main Topics  . Smoking status: Former Smoker    Packs/day: 0.25    Years: 0.50    Types: Cigarettes    Quit date: 07/25/1996  . Smokeless tobacco: Never Used  . Alcohol use No  . Drug use: No  . Sexual activity: Yes    Birth control/ protection: Implant, IUD     Comment: placed 04/2011   Other Topics Concern  . Not on file   Social History Narrative   Works at [employer].        Family History  Problem Relation Age of Onset  . Heart disease Mother        CHF (no details)  . Hypertension Mother   . Lupus Mother   . Heart disease Father        Murmur  . Heart disease Sister 32        No details.  History of a pacemaker    Vitals:   08/14/17 0900  BP: 116/74  Pulse: (!) 113  SpO2: (!) 87%  Weight: 153 lb 9.6 oz (69.7 kg)    PHYSICAL EXAM: General: Chronically ill appearing. NAD.  HEENT: Normal, wearing oxygen.  Neck: Supple. JVP 5-6. Carotids 2+ bilat; no bruits. No thyromegaly or nodule noted. Cor: PMI nondisplaced. RRR, No M/G/R noted Lungs: Diminished in all lobes, normal effort.  Abdomen: Soft, non-tender, non-distended, no HSM. No bruits or masses. +BS  Extremities: No cyanosis, clubbing, or rash. R and LLE no edema.  Neuro: Alert & orientedx3, cranial nerves grossly intact. moves all 4 extremities w/o  difficulty. Affect pleasant  Optivol: Fluid below threshold, thoracic impedance high.    ASSESSMENT & PLAN: 1) Chronic systolic HF:  Echo 05/2014 EF 20-25%, echo 1/17 improved with EF 40-45% and echo in 8/17 with EF up to 50-55%.  ECHO 11/2016  EF 45-50%. Nonischemic cardiomyopathy.  Medtronic CRT-P device.   - NYHA III, limited by chronic ILD - Volume stable on exam.  - She has not taken her medications today, tachycardiac and is + flu. Keep Bisoprolol at 2.5 mg daily. - Continue lasix 20 gm daily.  - Will need repeat echo in 6 months.    2) Anti-synthetase syndrome with interstitial lung disease: Suspect this also causes the persistent weakness.  - Follows with [facility]. Basilia Jumbo at [facility].  - Last visit last week, prednisone decreased, tacrolimis increased. - Continues on Cellcept.   3) Congenital AVN block s/p pacer:  - Follows with Dr. Ladona Ridgel.   4) Chronic Respiratory Failure - On 2-3L. Oxygen saturations lower today likely in setting of + flu.   Follow up in 6 months with an Echo.      Little Ishikawa NP-C 08/14/2017

## 2017-08-18 ENCOUNTER — Ambulatory Visit: Payer: Medicare Other | Admitting: Internal Medicine

## 2017-08-20 ENCOUNTER — Ambulatory Visit (HOSPITAL_COMMUNITY)
Admission: RE | Admit: 2017-08-20 | Discharge: 2017-08-20 | Disposition: A | Discharge status: Home / Self Care | Payer: Medicare Other | Source: Ambulatory Visit | Attending: Family Medicine | Admitting: Family Medicine

## 2017-08-20 ENCOUNTER — Encounter: Payer: Self-pay | Admitting: Student in an Organized Health Care Education/Training Program

## 2017-08-20 ENCOUNTER — Ambulatory Visit (INDEPENDENT_AMBULATORY_CARE_PROVIDER_SITE_OTHER): Payer: Medicare Other | Admitting: Student in an Organized Health Care Education/Training Program

## 2017-08-20 VITALS — BP 102/64 | HR 82 | Temp 98.1°F | Ht 65.0 in | Wt 157.2 lb

## 2017-08-20 DIAGNOSIS — J069 Acute upper respiratory infection, unspecified: Secondary | ICD-10-CM | POA: Diagnosis not present

## 2017-08-20 DIAGNOSIS — J849 Interstitial pulmonary disease, unspecified: Secondary | ICD-10-CM | POA: Diagnosis not present

## 2017-08-20 DIAGNOSIS — I517 Cardiomegaly: Secondary | ICD-10-CM | POA: Insufficient documentation

## 2017-08-20 DIAGNOSIS — R05 Cough: Secondary | ICD-10-CM | POA: Diagnosis not present

## 2017-08-20 MED ORDER — OSELTAMIVIR PHOSPHATE 75 MG PO CAPS: 75.0000 mg | ORAL_CAPSULE | Freq: Every day | ORAL | 0 refills | Status: DC

## 2017-08-20 NOTE — Progress Notes (Signed)
CC: Rhinorrhea/congestion  HPI: Makayla Vasquez is a 36 y.o. female with PMH significant for ILD, diabetes who presents to [facility] today with rhinorrhea and cough of over two weeks duration.   Patient was in her usual state of health until about 3 weeks ago when she developed rhinorrhea, cough and cough spasms. No fevers that she knows of. No wheezing. She usually uses between 2-4L O2 at baseline, however has been using 3-4L over last two weeks. She was seen at [facility] on 10/18 and an RVP was collected at that time, found to be rhino/enterovirus positive. CXR was negative at that time for focal consolidation. Of note patient is chronically immunosuppressed with cellcept, prednisone. She is also diabetic. She has not gotten a flu shot this year, she says because she has been feeling sick for most of the last month.   Over the past week, her symptoms have continued, and she additionally developed muscle aches one week ago. She has also had diarrhea for the last three days.  She follows with rheumatology for ILD.  Review of Symptoms:  See HPI for ROS.   CC, SH/smoking status, and VS noted.  Objective: BP 102/64   Pulse 82   Temp 98.1 F (36.7 C) (Oral)   Ht 5\' 5"  (1.651 m)   Wt 157 lb 3.2 oz (71.3 kg)   LMP 08/14/2017 (Approximate)   SpO2 96%   BMI 26.16 kg/m  GEN: NAD, sits in chair, O2 by [place] in place EYE: no conjunctival injection or pallor ENMT: no rhinorrhea, oropharynx clear NECK: full ROM RESPIRATORY: +shallow breath sounds, CTA bil, no W/R/R, decreased breath sounds at bilateral bases, good effort, comfortable work of breathing, speaks in full sentences CV: RRR, no m/r/g GI: soft, non-tender, non-distended SKIN: warm and dry, no rashes or lesions  Assessment and plan:  Acute upper respiratory infection Rhino/enterovirus positive 2 weeks ago with CXR negative for acute infiltrate. However over last week developed worsened symptoms with new myalgias and diarrhea. No  fevers.  Her presentation is not a home run diagnosis for flu, and symptoms have been going on for more than 48 hours. However patient is at high risk for severe complications from flu given her multiple co morbidities including ILD and T2DM and so it is reasonable to treat with tamiflu 75 mg daily x5 days.  She was hospitalized last year for PNA. No fevers, however with cellcept she may not launch an immune response as expected. Will obtain CXR.  Close follow up in 1 week, or sooner if needed. Return precautions including increased oxygen requirement were discussed.    Orders Placed This Encounter  Procedures  . DG Chest 2 View    Standing Status:   Future    Standing Expiration Date:   10/20/2018    Order Specific Question:   Reason for Exam (SYMPTOM  OR DIAGNOSIS REQUIRED)    Answer:   cough    Order Specific Question:   Is patient pregnant?    Answer:   No    Order Specific Question:   Preferred imaging location?    Answer:   [facility]    Order Specific Question:   Radiology Contrast Protocol - do NOT remove file path    Answer:   [email or web address]    Meds ordered this encounter  Medications  . oseltamivir (TAMIFLU) 75 MG capsule    Sig: Take 1 capsule (75 mg total) by mouth daily.    Dispense:  5 capsule  Refill:  0     Howard Pouch, MD,MS,  PGY2 08/20/2017 11:20 AM

## 2017-08-20 NOTE — Patient Instructions (Signed)
It was a pleasure seeing you today in our clinic. Today we discussed your symptoms. Here is the treatment plan we have discussed and agreed upon together:   It is likely a virus causing your symptoms. However given that you have intersitial lung disease, you are high risk for having complications from flu. Therefore, we will go ahead and treat for flu.  Additionally, you are at risk for developing pneumonia. I think it would be reasonable to check a chest Xray. Please go to the hospital to have this completed.   If you develop worsening oxygen requirement, high fevers, or worsening symptoms, these would be reasons for you to come back in.  Please schedule a follow up visit in 1 week.  Our clinic's number is [phone]. Please call with questions or concerns about what we discussed today.  Be well, Dr. Mosetta Putt

## 2017-08-20 NOTE — Assessment & Plan Note (Signed)
Rhino/enterovirus positive 2 weeks ago with CXR negative for acute infiltrate. However over last week developed worsened symptoms with new myalgias and diarrhea. No fevers.  Her presentation is not a home run diagnosis for flu, and symptoms have been going on for more than 48 hours. However patient is at high risk for severe complications from flu given her multiple co morbidities including ILD and T2DM and so it is reasonable to treat with tamiflu 75 mg daily x5 days.  She was hospitalized last year for PNA. No fevers, however with cellcept she may not launch an immune response as expected. Will obtain CXR.  Close follow up in 1 week, or sooner if needed. Return precautions including increased oxygen requirement were discussed.

## 2017-08-24 ENCOUNTER — Ambulatory Visit (INDEPENDENT_AMBULATORY_CARE_PROVIDER_SITE_OTHER): Payer: Medicare Other | Admitting: Internal Medicine

## 2017-08-24 ENCOUNTER — Encounter: Payer: Self-pay | Admitting: Internal Medicine

## 2017-08-24 VITALS — BP 128/80 | Temp 97.9°F | Wt 160.0 lb

## 2017-08-24 DIAGNOSIS — E118 Type 2 diabetes mellitus with unspecified complications: Secondary | ICD-10-CM

## 2017-08-24 DIAGNOSIS — E669 Obesity, unspecified: Secondary | ICD-10-CM

## 2017-08-24 DIAGNOSIS — E1169 Type 2 diabetes mellitus with other specified complication: Secondary | ICD-10-CM | POA: Diagnosis not present

## 2017-08-24 DIAGNOSIS — Z711 Person with feared health complaint in whom no diagnosis is made: Secondary | ICD-10-CM

## 2017-08-24 LAB — POCT GLYCOSYLATED HEMOGLOBIN (HGB A1C): HEMOGLOBIN A1C: 8.3

## 2017-08-24 MED ORDER — METFORMIN HCL 1000 MG PO TABS: 1000.0000 mg | ORAL_TABLET | Freq: Two times a day (BID) | ORAL | 3 refills | Status: DC

## 2017-08-24 NOTE — Patient Instructions (Signed)
It was nice seeing you again today Makayla Vasquez!  Your A1C is still higher than it should be, meaning that your blood sugar is consistently higher than it should be. Please increase Lantus to 27 units a day. Also, increase your metformin to 1000mg  twice a day. I have sent in a new metformin prescription for the 1000mg  tablets.   I will see you back in 3 months to check your A1C again.   If you have any questions or concerns, please feel free to call the clinic.   Be well,  Dr. 

## 2017-08-24 NOTE — Progress Notes (Signed)
   Subjective:   Patient: Makayla Vasquez       Birthdate: 05-17-1981       MRN: [id number]      HPI  Makayla Vasquez is a 36 y.o. female presenting for f/u of Type 2 DM and to discuss mammogram.   Type II DM Last A1C 9.7 in 07/18. Patient was instructed to increase metformin from 500mg  BID to 1000mg  BID at that appointment, however has not done so. She said she was unaware she was supposed to make this change. Has been taking Lantus 25U daily, and Novolog 8U with meals. If blood sugar >300, she takes an additional 2U of Novolog. Patient's lowest blood sugar is 120 when fasting; highest has been 383. Normally gets measurements between 160-180. Patient is only eating two meals per day as part of a nutrition plan.   Possible mammogram Patient had mammogram performed in 07/2016 for bilateral breast pain x6 mo. This showed no evidence of malignancy in either breast. Screening mammogram at age 40 was recommended. Patient wondering today whether she needs another mammogram, as she was told by her rheumatologist that erythema on her knuckles was a sign of possible breath cancer and she should be screened. Patient denies any breast masses. Does still have bilateral pain but this is unchanged.    Smoking status reviewed. Patient is former smoker.   Review of Systems See HPI.     Objective:  Physical Exam  Constitutional: She is oriented to person, place, and time and well-developed, well-nourished, and in no distress.  HENT:  Head: Normocephalic and atraumatic.  Eyes: Conjunctivae and EOM are normal. Right eye exhibits no discharge. Left eye exhibits no discharge.  Cardiovascular: Normal rate.  Pulmonary/Chest: Effort normal. No respiratory distress.  Using portable oxygen  Neurological: She is alert and oriented to person, place, and time.  Skin: Skin is warm and dry.  Psychiatric: Affect and judgment normal.      Assessment & Plan:  Diabetes mellitus type 2 in obese (HCC) A1C improved from  last visit at 8.3 today, however still not at goal. No hypoglycemia at home, however average blood sugars remain above goal. Will increase metformin to 1000mg  BID as planned at last visit, and also increase Lantus to 27U. Continue Novolog with meals as is.  - F/u in 3 months for repeat A1C  Concern about breast cancer in female without diagnosis Patient wanting mammogram because she was told by her rheumatologist that erythema on her knuckles can be sign of breast cancer. Personally could not find any data regarding this. Precepted with Drs. 08/2016 and 40 who also are unaware of this. Patient only 36yo, and had negative mammogram last year with f/u not recommended until age 31yo. Discussed with patient, who was okay with waiting until 36yo for repeat mammogram.  - Repeat screening mammogram at age 57yo   Precepted with Dr. Pollie Meyer.   36yo, MD, MPH PGY-3 36yo Family Medicine Pager [phone]

## 2017-08-25 ENCOUNTER — Encounter: Payer: Self-pay | Admitting: Internal Medicine

## 2017-08-25 DIAGNOSIS — Z711 Person with feared health complaint in whom no diagnosis is made: Secondary | ICD-10-CM | POA: Insufficient documentation

## 2017-08-25 NOTE — Assessment & Plan Note (Signed)
Patient wanting mammogram because she was told by her rheumatologist that erythema on her knuckles can be sign of breast cancer. Personally could not find any data regarding this. Precepted with Drs. Gwendolyn Grant and Pollie Meyer who also are unaware of this. Patient only 36yo, and had negative mammogram last year with f/u not recommended until age 55yo. Discussed with patient, who was okay with waiting until 36yo for repeat mammogram.  - Repeat screening mammogram at age 22yo

## 2017-08-25 NOTE — Assessment & Plan Note (Signed)
A1C improved from last visit at 8.3 today, however still not at goal. No hypoglycemia at home, however average blood sugars remain above goal. Will increase metformin to 1000mg  BID as planned at last visit, and also increase Lantus to 27U. Continue Novolog with meals as is.  - F/u in 3 months for repeat A1C

## 2017-08-26 DIAGNOSIS — Z5181 Encounter for therapeutic drug level monitoring: Secondary | ICD-10-CM | POA: Diagnosis not present

## 2017-08-26 DIAGNOSIS — M3320 Polymyositis, organ involvement unspecified: Secondary | ICD-10-CM | POA: Diagnosis not present

## 2017-08-26 DIAGNOSIS — Z79899 Other long term (current) drug therapy: Secondary | ICD-10-CM | POA: Diagnosis not present

## 2017-08-27 ENCOUNTER — Ambulatory Visit: Payer: Medicare Other | Admitting: Student in an Organized Health Care Education/Training Program

## 2017-08-27 NOTE — Progress Notes (Deleted)
   CC: ***  HPI: Makayla Vasquez is a 36 y.o. female with PMH significant for *** who presents to [facility] today with *** of *** duration.   Overdue health maintenance ***  Review of Symptoms:  See HPI for ROS.   CC, SH/smoking status, and VS noted.  Objective: LMP 08/14/2017 (Approximate)  GEN: NAD, alert, cooperative, and pleasant.*** EYE: no conjunctival injection, pupils equally round and reactive to light ENMT: normal tympanic light reflex, no nasal polyps,no rhinorrhea, no pharyngeal erythema or exudates NECK: full ROM, no thyromegaly RESPIRATORY: clear to auscultation bilaterally with no wheezes, rhonchi or rales, good effort CV: RRR, no m/r/g, no peripheral edema GI: soft, non-tender, non-distended, no hepatosplenomegaly SKIN: warm and dry, no rashes or lesions NEURO: II-XII grossly intact, normal gait, peripheral sensation intact PSYCH: AAOx3, appropriate affect  Flu Vaccine: *** Tdap Vaccine: *** - every 63yrs - (<3 lifetime doses or unknown): all wounds -- look up need for Tetanus IG - (>=3 lifetime doses): clean/minor wound if >29yrs from previous; all other wounds if >20yrs from previous Zoster Vaccine: *** (those >50yo, once) Pneumonia Vaccine: *** (those w/ risk factors) - (<3yr) Both: Immunocompromised, cochlear implant, CSF leak, asplenic, sickle cell, Chronic Renal Failure - (<76yr) PPSV-23 only: Heart dz, lung disease, DM, tobacco abuse, alcoholism, cirrhosis/liver disease. - (>65yr): PPSV13 then PPSV23 in 6-12mths;  - (>71yr): repeat PPSV23 once if pt received prior to 36yo and 73yrs have passed  Assessment and plan:  No problem-specific Assessment & Plan notes found for this encounter.   No orders of the defined types were placed in this encounter.   No orders of the defined types were placed in this encounter.    Howard Pouch, MD,MS,  PGY2 08/27/2017 5:23 AM

## 2017-08-31 ENCOUNTER — Other Ambulatory Visit: Payer: Self-pay | Admitting: Internal Medicine

## 2017-09-01 ENCOUNTER — Other Ambulatory Visit: Payer: Self-pay

## 2017-09-01 NOTE — Patient Outreach (Signed)
[facility]) Care Management  09/01/17  Makayla Vasquez [birth year] [id number]  Successful outreach completed with patient. Patient identification verified.  RNCM advised calling to follow up and patient stated that she has been doing fine except for recently being diagnosed with the flu. She stated that she has just completed tamiflu. However, she stated that she is doing ok now.  Patient reported that she has been doing fine and does not have any case management needs. Patient did have a follow up with her cardiologist for follow up of her heart failure on 08/14/2017 and everything went well. It was noted that her volume was stable and no changes were made to her medications. She was tachycardic at the visit, but had also not taken her medications yet and had the flu.She was scheduled to follow up in 6 months at which time she will have an echocardiogram (her last one in 11/2016 showed EF of 45-50%.  RNCM to perform case closure and discussed this with patient. She was agreeable to closure at this time as she has no needs. RNCM encouraged patient to contact [facility] if her situation changes or if she feels she would benefit from additional services in the future and patient verbalized understanding.  RNCM to send letter to PCP to advise of case closure and will send case to [facility] CMA for closure.  Turkey R. Kieon Lawhorn, RN, BSN, CCM [facility] Care Management Coordinator [phone]

## 2017-09-03 DIAGNOSIS — Z79899 Other long term (current) drug therapy: Secondary | ICD-10-CM | POA: Diagnosis not present

## 2017-09-03 DIAGNOSIS — R0602 Shortness of breath: Secondary | ICD-10-CM | POA: Diagnosis not present

## 2017-09-03 DIAGNOSIS — M6281 Muscle weakness (generalized): Secondary | ICD-10-CM | POA: Diagnosis not present

## 2017-09-03 DIAGNOSIS — D8989 Other specified disorders involving the immune mechanism, not elsewhere classified: Secondary | ICD-10-CM | POA: Diagnosis not present

## 2017-09-03 DIAGNOSIS — Z7952 Long term (current) use of systemic steroids: Secondary | ICD-10-CM | POA: Diagnosis not present

## 2017-09-03 DIAGNOSIS — R05 Cough: Secondary | ICD-10-CM | POA: Diagnosis not present

## 2017-09-03 DIAGNOSIS — M722 Plantar fascial fibromatosis: Secondary | ICD-10-CM | POA: Diagnosis not present

## 2017-09-12 ENCOUNTER — Other Ambulatory Visit: Payer: Self-pay

## 2017-09-12 ENCOUNTER — Encounter (HOSPITAL_COMMUNITY): Payer: Self-pay | Admitting: *Deleted

## 2017-09-12 ENCOUNTER — Ambulatory Visit (HOSPITAL_COMMUNITY)
Admission: EM | Admit: 2017-09-12 | Discharge: 2017-09-12 | Disposition: A | Discharge status: Home / Self Care | Payer: Medicare Other | Attending: Internal Medicine | Admitting: Internal Medicine

## 2017-09-12 DIAGNOSIS — J849 Interstitial pulmonary disease, unspecified: Secondary | ICD-10-CM

## 2017-09-12 DIAGNOSIS — R0602 Shortness of breath: Secondary | ICD-10-CM

## 2017-09-12 HISTORY — DX: Interstitial pulmonary disease, unspecified: J84.9

## 2017-09-12 MED ORDER — GUAIFENESIN-CODEINE 100-10 MG/5ML PO SOLN: 5.0000 mL | ORAL | 0 refills | Status: DC | PRN

## 2017-09-12 NOTE — ED Triage Notes (Signed)
Pt has interstitial lung disease; uses 4L O2.  C/O cough, congestion, decreased O2 sats over past month; was told by providers to go to ED for decreased O2 levels prn, "but I don't want to go there".  Unsure if fevers - pt is on Cellcept.  States home O2 sats have been in "80s", normally runs in "90s".

## 2017-09-12 NOTE — Discharge Instructions (Addendum)
Long standing air hunger, feels bad and no easy answer -> might ask your ung doctor for thoughts about pulmonary rehab, helium/oxygen mixtures, phenergan, acupuncture, other strategies for managing air hunger.  No signs of sinus infection, ear infection, pneumonia, bronchitis on physical exam today.  Prescription for small amount of codeine cough syrup today for cough.  Use sparingly for worst coughing.  Followup with lung doctor on 12/11 (or sooner if possible).

## 2017-09-12 NOTE — ED Provider Notes (Signed)
[facility]    CSN: [id number] Arrival date & time: 09/12/17  1535     History   Chief Complaint Chief Complaint  Patient presents with  . Cough  . Generalized Body Aches    HPI Makayla Vasquez is a 36 y.o. female.   She has multiple medical problems including ILD, chronically on oxygen.  She has a lot of air hunger, not a lot that makes it feel better.  Was diagnosed in the last couple of weeks with flu, treated with Tamiflu, not a lot of improvement.  Is tapering her chronic steroids.  Most bothered by hard coughing, clear, sometimes blood-streaked rhinorrhea, and the observation that her oxygen saturation drops intermittently at home.  No fever, no malaise.  Is a little bit achy.  HPI  Past Medical History:  Diagnosis Date  . Anxiety   . Arthritis    rheumatoid arthritis- mild, no rheumatology care   . Asthma    seasonal allergies   . Asymptomatic LV dysfunction    a. Echo in Dec 2011 with EF 35 to 40%. Felt to be due to paced rhythm. b. EF 25-30% in 07/2014.  . Bipolar affective disorder (HCC)   . Cardiac pacemaker    a. Since age 81 in 86. b. Upgrade to BiV in 2013.  [name] Carpal tunnel syndrome of right wrist   . CHF (congestive heart failure) (HCC)   . Congenital complete AV block   . Depression    bipolar  . Diabetes mellitus without complication (HCC)   . GERD (gastroesophageal reflux disease)   . Hypertension   . Interstitial lung disease (HCC)   . Lupus   . Lupus (systemic lupus erythematosus) (HCC)   . Obesity   . Pneumonitis    a. a/w hypoxia - inflammatory - large workup 07/2014.  . Presence of permanent cardiac pacemaker   . Seizures (HCC)    as a child- from high fever  . Sinus tachycardia     Patient Active Problem List   Diagnosis Date Noted  . Concern about breast cancer in female without diagnosis 08/25/2017  . Acute upper respiratory infection 08/20/2017  . Dyspareunia, female 05/14/2017  . 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  On Cellcept therapy   . On prednisone therapy   . Systemic lupus erythematosus (HCC)   . Long term (current) use of systemic steroids   . Chronic diastolic congestive heart failure (HCC)   . Anemia of chronic disease   . Bipolar affective disorder in remission (HCC)   . Cardiac pacemaker in situ   . Adrenal insufficiency (HCC)   . Antisynthetase syndrome (HCC)   . Idiopathic hypotension 07/08/2015  . Hyperlipidemia associated with type 2 diabetes mellitus (HCC) 05/04/2015  . Diabetes mellitus type 2 in obese (HCC)   . Chronic respiratory failure with hypoxia (HCC) 08/07/2014  . Insertion of implantable subdermal contraceptive 07/12/2014  . ILD (interstitial lung disease) (HCC) 06/19/2014  . Congestive dilated cardiomyopathy (HCC) 05/31/2014  . Chronic systolic heart failure (HCC) 02/26/2012  . PPM-Medtronic   . Congenital complete AV block   . GERD (gastroesophageal reflux disease)   . THYROGLOSSAL DUCT CYST 07/18/2010  . POLYCYSTIC OVARY 12/17/2006    Past Surgical History:  Procedure Laterality Date  . ATRIAL TACH ABLATION N/A 08/14/2014   Procedure: ATRIAL TACH ABLATION;  Surgeon: Marinus Maw, MD;  Location[facility] CATH LAB;  Service: Cardiovascular;  Laterality: N/A;  . BI-VENTRICULAR PACEMAKER UPGRADE N/A 03/08/2012   Procedure:  BI-VENTRICULAR PACEMAKER UPGRADE;  Surgeon: Marinus Maw, MD;  Location: [facility] CATH LAB;  Service: Cardiovascular;  Laterality: N/A;  . CARPAL TUNNEL WITH CUBITAL TUNNEL Right 07/26/2013   Procedure: RIGHT LIMITED OPEN CARPAL TUNNEL RELEASE ,  RIGHT CUBITAL TUNNEL RELEASE, INSITU VERSES ULNAR NERVE DECOMPRESSION AND ANTERIOR TRANSPOSITION;  Surgeon: Dominica Severin, MD;  Location: [facility] OR;  Service: Orthopedics;  Laterality: Right;  . CESAREAN SECTION    . CHOLECYSTECTOMY    . CYST EXCISION  12/10/2012   THYROID  . INSERT / REPLACE / REMOVE PACEMAKER     2001  . IUD REMOVAL  11/03/2011   Procedure: INTRAUTERINE DEVICE (IUD) REMOVAL;  Surgeon: Myra C. Marice Potter,  MD;  Location: [facility] ORS;  Service: Gynecology;  Laterality: N/A;  . NASAL FRACTURE SURGERY     /w plate   . RIGHT HEART CATHETERIZATION N/A 10/26/2014   Procedure: RIGHT HEART CATH;  Surgeon: Dolores Patty, MD;  Location: [facility] CATH LAB;  Service: Cardiovascular;  Laterality: N/A;  . THROAT SURGERY  1994   s/p laser treatment  . THYROGLOSSAL DUCT CYST N/A 12/10/2012   Procedure: REVISION OF THYROGLOSSAL DUCT CYST EXCISION;  Surgeon: Serena Colonel, MD;  Location: [facility] OR;  Service: ENT;  Laterality: N/A;  Revision of Thyroglossal Duct Cyst Excision  . VIDEO BRONCHOSCOPY Bilateral 06/19/2014   Procedure: VIDEO BRONCHOSCOPY WITHOUT FLUORO;  Surgeon: Kalman Shan, MD;  Location: [facility] ENDOSCOPY;  Service: Cardiopulmonary;  Laterality: Bilateral;    OB History    Gravida Para Term Preterm AB Living   1 1 1     1    SAB TAB Ectopic Multiple Live Births                   Home Medications    Prior to Admission medications   Medication Sig Start Date End Date Taking? Authorizing Provider  bisoprolol (ZEBETA) 5 MG tablet Take 0.5 tablets (2.5 mg total) by mouth daily. Take in AM 03/23/17  Yes 05/23/17, NP  Etonogestrel [facility]) Inject into the skin.   Yes [provider]  furosemide (LASIX) 20 MG tablet TAKE 1 TABLET BY MOUTH EVERY DAY 05/15/17  Yes 05/17/17, MD  insulin aspart (NOVOLOG) 100 UNIT/ML FlexPen Inject 8 Units into the skin 3 (three) times daily with meals. 09/27/16  Yes 14/9/17, MD  LANTUS SOLOSTAR 100 UNIT/ML Solostar Pen INJECT 15 UNITS SUBCUTANEOUSLY ONCE DAILY 05/15/17  Yes 05/17/17, MD  loratadine (CLARITIN) 10 MG tablet Take 1 tablet (10 mg total) by mouth daily. 06/26/17  Yes 08/26/17, DO  metFORMIN (GLUCOPHAGE) 1000 MG tablet Take 1 tablet (1,000 mg total) 2 (two) times daily with a meal by mouth. 08/24/17  Yes 13/5/18, MD  mycophenolate (CELLCEPT) 500 MG tablet Take 1,000 mg by mouth 2 (two)  times daily.    Yes [provider]  OXYGEN Inhale 4 L into the lungs.   Yes [provider]  pantoprazole (PROTONIX) 40 MG tablet TAKE 1 TABLET (40MG  TOTAL) BY MOUTH DAILY 05/15/17  Yes , MD  potassium chloride SA (K-DUR,KLOR-CON) 20 MEQ tablet Take 1 tablet (20 mEq total) by mouth daily. 07/10/17  Yes Marquette Saa, MD  predniSONE (DELTASONE) 5 MG tablet Take 30 mg by mouth daily with breakfast.    Yes [provider]  tacrolimus (PROGRAF) 1 MG capsule Take 1.5 mg by mouth 2 (two) times daily.  01/26/17  Yes Ang, Laurey Morale, MD  Vitamin D, Ergocalciferol, (DRISDOL) 50000 units CAPS capsule Take 50,000 Units by mouth once a week. 09/27/16  Yes [provider]  amoxicillin-clavulanate (AUGMENTIN) 875-125 MG tablet Take 1 tablet by mouth 2 (two) times daily. 06/26/17   Arvilla Market, DO  benzonatate (TESSALON) 200 MG capsule Take 1 capsule (200 mg total) by mouth 3 (three) times daily as needed for cough. 08/02/17   Eustace Moore, MD  EASY TOUCH PEN NEEDLES 31G X 8 MM MISC USE AS DIRECTED 08/14/17   Marquette Saa, MD  fluocinonide (LIDEX) 0.05 % external solution 1 APPLICATION APPLY ON THE SKIN AS DIRECTED APPLY TO AFFECTED AREAS DAILY ON SCALP AS NEEDED FOR ITCHING. 09/28/15   [provider]  fluticasone (FLONASE) 50 MCG/ACT nasal spray Place 2 sprays into both nostrils daily. 06/26/17   Arvilla Market, DO  fluticasone (FLONASE) 50 MCG/ACT nasal spray INSTILL TWO SPRAYS INTO BOTH NOSTRILS DAILY. 08/13/17   Marquette Saa, MD  ipratropium (ATROVENT) 0.06 % nasal spray Place 2 sprays into both nostrils 4 (four) times daily. 10/25/16   Linna Hoff, MD  norgestimate-ethinyl estradiol (SPRINTEC 28) 0.25-35 MG-MCG tablet Take 1 tablet by mouth daily. 05/13/17   Howard Pouch, MD  oseltamivir (TAMIFLU) 75 MG capsule Take 1 capsule (75 mg total) by mouth daily. 08/20/17   Howard Pouch, MD  predniSONE  (DELTASONE) 20 MG tablet Take 3 tablets (60 mg total) by mouth daily. 08/02/17   Eustace Moore, MD    Family History Family History  Problem Relation Age of Onset  . Heart disease Mother        CHF (no details)  . Hypertension Mother   . Lupus Mother   . Heart disease Father        Murmur  . Heart disease Sister 102        No details.  History of a pacemaker    Social History Social History   Tobacco Use  . Smoking status: Former Smoker    Packs/day: 0.25    Years: 0.50    Pack years: 0.12    Types: Cigarettes    Last attempt to quit: 07/25/1996    Years since quitting: 21.1  . Smokeless tobacco: Never Used  Substance Use Topics  . Alcohol use: No  . Drug use: No     Allergies   Atorvastatin; Sertraline hcl; and Tape   Review of Systems Review of Systems  All other systems reviewed and are negative.    Physical Exam Triage Vital Signs ED Triage Vitals  Enc Vitals Group     BP 09/12/17 1611 128/62     Pulse Rate 09/12/17 1611 (!) 117     Resp 09/12/17 1611 (!) 24     Temp 09/12/17 1611 98.4 F (36.9 C)     Temp Source 09/12/17 1611 Oral     SpO2 09/12/17 1611 96 %     Weight --      Height --      Pain Score 09/12/17 1612 7     Pain Loc --    Updated Vital Signs BP 128/62   Pulse (!) 117   Temp 98.4 F (36.9 C) (Oral)   Resp (!) 24   LMP 08/14/2017 (Approximate)   SpO2 96%   Physical Exam  Constitutional: She is oriented to person, place, and time. No distress.  Sitting up on the exam table, able to speak in sentences, not splinting  HENT:  Head: Atraumatic.  Bilateral  TMs are translucent, no erythema Mild nasal congestion, no excoriation visible Throat is very minimally injected  Eyes:  Conjugate gaze observed, no eye redness/discharge  Neck: Neck supple.  Cardiovascular: Normal rate and regular rhythm.  Pulmonary/Chest: No respiratory distress. She has no wheezes.  Symmetric breath sounds, good air movement, fine crackles throughout    Abdominal: She exhibits no distension.  Musculoskeletal: Normal range of motion. She exhibits no edema.  Neurological: She is alert and oriented to person, place, and time.  Skin: Skin is warm and dry.  Nursing note and vitals reviewed.    UC Treatments / Results   Procedures Procedures (including critical care time) None today  Final Clinical Impressions(s) / UC Diagnoses   Final diagnoses:  Shortness of breath  ILD (interstitial lung disease) (HCC)   Long standing air hunger, feels bad and no easy answer -> might ask your ung doctor for thoughts about pulmonary rehab, helium/oxygen mixtures, phenergan, acupuncture, other strategies for managing air hunger.  No signs of sinus infection, ear infection, pneumonia, bronchitis on physical exam today.  Prescription for small amount of codeine cough syrup today for cough.  Use sparingly for worst coughing.  Followup with lung doctor on 12/11 (or sooner if possible).    ED Discharge Orders        Ordered    guaiFENesin-codeine 100-10 MG/5ML syrup  Every 4 hours PRN     09/12/17 1814       Controlled Substance Prescriptions [place] Controlled Substance Registry consulted? Yes, I have consulted the [place] Controlled Substances Registry for this patient, and feel the risk/benefit ratio today is favorable for proceeding with this prescription for a controlled substance.   Eustace Moore, MD 09/13/17 2049

## 2017-09-12 NOTE — ED Triage Notes (Signed)
Had CXR 2 wks ago - no pneumonia noted then.

## 2017-09-22 ENCOUNTER — Other Ambulatory Visit: Payer: Self-pay

## 2017-09-22 ENCOUNTER — Ambulatory Visit (INDEPENDENT_AMBULATORY_CARE_PROVIDER_SITE_OTHER): Payer: Medicare Other | Admitting: Internal Medicine

## 2017-09-22 ENCOUNTER — Encounter: Payer: Self-pay | Admitting: Internal Medicine

## 2017-09-22 DIAGNOSIS — R05 Cough: Secondary | ICD-10-CM | POA: Diagnosis present

## 2017-09-22 DIAGNOSIS — Z79899 Other long term (current) drug therapy: Secondary | ICD-10-CM | POA: Diagnosis not present

## 2017-09-22 DIAGNOSIS — M3320 Polymyositis, organ involvement unspecified: Secondary | ICD-10-CM | POA: Diagnosis not present

## 2017-09-22 DIAGNOSIS — R059 Cough, unspecified: Secondary | ICD-10-CM

## 2017-09-22 MED ORDER — BENZONATATE 200 MG PO CAPS: 200.0000 mg | ORAL_CAPSULE | Freq: Three times a day (TID) | ORAL | 1 refills | Status: DC | PRN

## 2017-09-22 NOTE — Progress Notes (Signed)
   Subjective:   Patient: Makayla Vasquez       Birthdate: [birth year]       MRN: [id number]      HPI  GRAZIA TAFFE is a 36 y.o. female presenting for cough.   Cough Patient last seen for this issue at urgent care on 08/24/17. Was prescribed Hycodan cough syrup and discharged with instructions to f/u with her pulmonologist.  Since then, patient thinks cough has worsened. She never picked up cough syrup, as this was too expensive. Did have Tessalon perles from a prior visit which she said were helpful, however she has run out. Says that now she coughs so hard at times that she vomits. Endorses production of clear sputum with no blood. Denies difficulty breathing. Has had to increase her oxygen from baseline 2L up to 4L at time when she is coughing very badly. She says she does this to help herself calm down rather than because she feels she cannot breathe. Endorses some wheezing. Denies sore throat but does endorse itchy throat. Started taking Claritin last week but doesn't think this has helped much. Endorses decreased appetite. Says she is unsure if she has had any sick contacts or not. Endorses chills but no fever. Endorses nasal congestion.  Smoking status reviewed. Patient is former smoker.   Review of Systems See HPI.     Objective:  Physical Exam  Constitutional: She is oriented to person, place, and time and well-developed, well-nourished, and in no distress.  HENT:  Head: Normocephalic and atraumatic.  Nose: Nose normal.  Mouth/Throat: Oropharynx is clear and moist. No oropharyngeal exudate.  Cardiovascular: Normal rate, regular rhythm and normal heart sounds.  No murmur heard. Pulmonary/Chest:  Normal WOB on 2L [place]. Able to speak in full sentences. Lungs CTAB, no wheezing. Good air movement bilaterally.   Neurological: She is alert and oriented to person, place, and time.  Skin: Skin is warm and dry.  Psychiatric: Affect and judgment normal.   Assessment & Plan:   Cough Ongoing for >1 month now. Accompanying nasal congestion suggestive of viral etiology. Would expect to have improved by now, however patient is immunocompromised, so could just be taking longer to clear from her system. Less concern for PNA, as patient afebrile and lungs clear. Less likely allergies, as patient taking Claritin with no improvement. O2 sat 93% today and patient with normal WOB on her baseline 2L O2 and lungs CTAB. Encouraging that she is only increasing O2 due to anxiety rather than SOB, and that she denies any SOB. As Tessalon perles have been helpful in past, will refill this. Encouraged patient to keep f/u appt with pulmonology next week.   Tarri Abernethy, MD, MPH PGY-3 [facility] Pager [phone]

## 2017-09-22 NOTE — Patient Instructions (Addendum)
It was nice seeing you again today Ms. Ripp!  You can continue taking one Tessalon perle up to every 8 hours as needed. You can also try eating honey, either a teaspoonful alone or mixed into a warm beverage, 3-4 times a day to help with cough. Propping yourself up onto a couple of pillows at night may also help prevent coughing while sleeping.   Please be sure to go to your pulmonology appointment on 09/29/17.   If you have any questions or concerns, please feel free to call the clinic.   Be well,  Dr. Natale Milch

## 2017-09-22 NOTE — Assessment & Plan Note (Addendum)
Ongoing for >1 month now. Accompanying nasal congestion suggestive of viral etiology. Would expect to have improved by now, however patient is immunocompromised, so could just be taking longer to clear from her system. Less concern for PNA, as patient afebrile and lungs clear. Less likely allergies, as patient taking Claritin with no improvement. O2 sat 93% today and patient with normal WOB on her baseline 2L O2 and lungs CTAB. Encouraging that she is only increasing O2 due to anxiety rather than SOB, and that she denies any SOB. As Tessalon perles have been helpful in past, will refill this. Encouraged patient to keep f/u appt with pulmonology next week.

## 2017-09-29 ENCOUNTER — Encounter (HOSPITAL_COMMUNITY): Payer: Self-pay | Admitting: *Deleted

## 2017-09-29 ENCOUNTER — Other Ambulatory Visit: Payer: Self-pay | Admitting: Family Medicine

## 2017-10-01 ENCOUNTER — Encounter (HOSPITAL_COMMUNITY): Payer: Self-pay | Admitting: Emergency Medicine

## 2017-10-01 ENCOUNTER — Emergency Department (HOSPITAL_COMMUNITY): Payer: Medicare Other

## 2017-10-01 ENCOUNTER — Emergency Department (HOSPITAL_COMMUNITY)
Admission: EM | Admit: 2017-10-01 | Discharge: 2017-10-02 | Disposition: A | Discharge status: Home / Self Care | Payer: Medicare Other | Attending: Emergency Medicine | Admitting: Emergency Medicine

## 2017-10-01 DIAGNOSIS — R112 Nausea with vomiting, unspecified: Secondary | ICD-10-CM | POA: Diagnosis not present

## 2017-10-01 DIAGNOSIS — J45909 Unspecified asthma, uncomplicated: Secondary | ICD-10-CM | POA: Diagnosis not present

## 2017-10-01 DIAGNOSIS — R05 Cough: Secondary | ICD-10-CM | POA: Diagnosis not present

## 2017-10-01 DIAGNOSIS — E860 Dehydration: Secondary | ICD-10-CM | POA: Diagnosis not present

## 2017-10-01 DIAGNOSIS — Z79899 Other long term (current) drug therapy: Secondary | ICD-10-CM | POA: Insufficient documentation

## 2017-10-01 DIAGNOSIS — Q246 Congenital heart block: Secondary | ICD-10-CM | POA: Insufficient documentation

## 2017-10-01 DIAGNOSIS — I5042 Chronic combined systolic (congestive) and diastolic (congestive) heart failure: Secondary | ICD-10-CM | POA: Diagnosis not present

## 2017-10-01 DIAGNOSIS — J069 Acute upper respiratory infection, unspecified: Secondary | ICD-10-CM | POA: Diagnosis not present

## 2017-10-01 DIAGNOSIS — R197 Diarrhea, unspecified: Secondary | ICD-10-CM | POA: Diagnosis not present

## 2017-10-01 DIAGNOSIS — Z794 Long term (current) use of insulin: Secondary | ICD-10-CM | POA: Diagnosis not present

## 2017-10-01 DIAGNOSIS — J111 Influenza due to unidentified influenza virus with other respiratory manifestations: Secondary | ICD-10-CM | POA: Diagnosis not present

## 2017-10-01 DIAGNOSIS — I110 Hypertensive heart disease with heart failure: Secondary | ICD-10-CM | POA: Insufficient documentation

## 2017-10-01 DIAGNOSIS — E119 Type 2 diabetes mellitus without complications: Secondary | ICD-10-CM | POA: Insufficient documentation

## 2017-10-01 DIAGNOSIS — R0602 Shortness of breath: Secondary | ICD-10-CM | POA: Diagnosis not present

## 2017-10-01 DIAGNOSIS — Z87891 Personal history of nicotine dependence: Secondary | ICD-10-CM | POA: Diagnosis not present

## 2017-10-01 DIAGNOSIS — Z950 Presence of cardiac pacemaker: Secondary | ICD-10-CM | POA: Diagnosis not present

## 2017-10-01 LAB — URINALYSIS, ROUTINE W REFLEX MICROSCOPIC
Bilirubin Urine: NEGATIVE
Glucose, UA: NEGATIVE mg/dL
Hgb urine dipstick: NEGATIVE
KETONES UR: 5 mg/dL — AB
Leukocytes, UA: NEGATIVE
Nitrite: NEGATIVE
PROTEIN: 30 mg/dL — AB
Specific Gravity, Urine: 1.031 — ABNORMAL HIGH (ref 1.005–1.030)
pH: 5 (ref 5.0–8.0)

## 2017-10-01 LAB — COMPREHENSIVE METABOLIC PANEL
ALBUMIN: 3.3 g/dL — AB (ref 3.5–5.0)
ALK PHOS: 62 U/L (ref 38–126)
ALT: 30 U/L (ref 14–54)
AST: 43 U/L — ABNORMAL HIGH (ref 15–41)
Anion gap: 8 (ref 5–15)
BUN: 11 mg/dL (ref 6–20)
CHLORIDE: 106 mmol/L (ref 101–111)
CO2: 26 mmol/L (ref 22–32)
CREATININE: 0.62 mg/dL (ref 0.44–1.00)
Calcium: 8.9 mg/dL (ref 8.9–10.3)
GFR calc Af Amer: >60 mL/min (ref >60)
GFR calc non Af Amer: >60 mL/min (ref >60)
GLUCOSE: 160 mg/dL — AB (ref 65–99)
Potassium: 3.8 mmol/L (ref 3.5–5.1)
SODIUM: 140 mmol/L (ref 135–145)
Total Bilirubin: 0.8 mg/dL (ref 0.3–1.2)
Total Protein: 7.7 g/dL (ref 6.5–8.1)

## 2017-10-01 LAB — C DIFFICILE QUICK SCREEN W PCR REFLEX
C DIFFICILE (CDIFF) TOXIN: NEGATIVE
C DIFFICLE (CDIFF) ANTIGEN: NEGATIVE
C Diff interpretation: NOT DETECTED

## 2017-10-01 LAB — CBC
HCT: 39.2 % (ref 36.0–46.0)
HEMOGLOBIN: 12.2 g/dL (ref 12.0–15.0)
MCH: 25.8 pg — AB (ref 26.0–34.0)
MCHC: 31.1 g/dL (ref 30.0–36.0)
MCV: 82.9 fL (ref 78.0–100.0)
PLATELETS: 407 10*3/uL — AB (ref 150–400)
RBC: 4.73 MIL/uL (ref 3.87–5.11)
RDW: 16.7 % — ABNORMAL HIGH (ref 11.5–15.5)
WBC: 8.7 10*3/uL (ref 4.0–10.5)

## 2017-10-01 LAB — I-STAT BETA HCG BLOOD, ED (MC, WL, AP ONLY): I-stat hCG, quantitative: <5 m[IU]/mL (ref <5)

## 2017-10-01 LAB — I-STAT TROPONIN, ED: Troponin i, poc: 0 ng/mL (ref 0.00–0.08)

## 2017-10-01 LAB — LIPASE, BLOOD: LIPASE: 19 U/L (ref 11–51)

## 2017-10-01 LAB — CK: Total CK: 2842 U/L — ABNORMAL HIGH (ref 38–234)

## 2017-10-01 MED ORDER — PREDNISONE 10 MG PO TABS: 20.0000 mg | ORAL_TABLET | Freq: Every day | ORAL | 0 refills | Status: AC

## 2017-10-01 NOTE — ED Notes (Signed)
Pt attempted to provide stool specimen but was unable to at this time 

## 2017-10-01 NOTE — ED Notes (Signed)
[facility] called for transport.  

## 2017-10-01 NOTE — ED Provider Notes (Signed)
[facility]-EMERGENCY DEPT Provider Note   CSN: [id number] Arrival date & time: 10/01/17  1212     History   Chief Complaint Chief Complaint  Patient presents with  . Diarrhea  . muscle weakness  . Cough    HPI Makayla Vasquez is a 36 y.o. female with a past medical history of ILD, CHF, pacemaker, HTN, DM, lupus, who presents to ED for evaluation of generalized bodyaches, productive cough, diarrhea, nausea, abdominal pain, URI symptoms for the past 3 weeks.  She reports 3-4 episodes of watery diarrhea per day.  She denies any hematochezia, melena, hematemesis.  She is on 2-4 L of oxygen via nasal cannula at home as needed.  She has been on 2 L nasal cannula at all times for the past 2 weeks due to her shortness of breath.  She takes her home steroids, CellCept, insulin, metformin, Tessalon Perles and other chronic medications daily as prescribed.  She was seen and evaluated at the urgent care center twice as well as by her primary care provider since symptoms began.  She was unable to get her prescription for cough syrup filled due to cost.  Of note, their notes state that this could most likely be an influenza-like illness that is just taking her longer to clear up due to her immune compromised state.  They are also concerned about electrolyte abnormalities from her diarrhea as well as possible C. difficile colitis due to the medications that she is on.   HPI  Past Medical History:  Diagnosis Date  . Anxiety   . Arthritis    rheumatoid arthritis- mild, no rheumatology care   . Asthma    seasonal allergies   . Asymptomatic LV dysfunction    a. Echo in Dec 2011 with EF 35 to 40%. Felt to be due to paced rhythm. b. EF 25-30% in 07/2014.  . Bipolar affective disorder (HCC)   . Cardiac pacemaker    a. Since age 34 in 50. b. Upgrade to BiV in 2013.  [name] Carpal tunnel syndrome of right wrist   . CHF (congestive heart failure) (HCC)   . Congenital complete AV block    . Depression    bipolar  . Diabetes mellitus without complication (HCC)   . GERD (gastroesophageal reflux disease)   . Hypertension   . Interstitial lung disease (HCC)   . Lupus   . Lupus (systemic lupus erythematosus) (HCC)   . Obesity   . Pneumonitis    a. a/w hypoxia - inflammatory - large workup 07/2014.  . Presence of permanent cardiac pacemaker   . Seizures (HCC)    as a child- from high fever  . Sinus tachycardia     Patient Active Problem List   Diagnosis Date Noted  . Concern about breast cancer in female without diagnosis 08/25/2017  . Acute upper respiratory infection 08/20/2017  . Dyspareunia, female 05/14/2017  . 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 On Cellcept therapy   . On prednisone therapy   . Systemic lupus erythematosus (HCC)   . Long term (current) use of systemic steroids   . Chronic diastolic congestive heart failure (HCC)   . Cough   . Anemia of chronic disease   . Bipolar affective disorder in remission (HCC)   . Cardiac pacemaker in situ   . Adrenal insufficiency (HCC)   . Antisynthetase syndrome (HCC)   . Idiopathic hypotension 07/08/2015  . Hyperlipidemia associated with type 2 diabetes mellitus (HCC) 05/04/2015  . Diabetes  mellitus type 2 in obese (HCC)   . Chronic respiratory failure with hypoxia (HCC) 08/07/2014  . Insertion of implantable subdermal contraceptive 07/12/2014  . ILD (interstitial lung disease) (HCC) 06/19/2014  . Congestive dilated cardiomyopathy (HCC) 05/31/2014  . Chronic systolic heart failure (HCC) 02/26/2012  . PPM-Medtronic   . Congenital complete AV block   . GERD (gastroesophageal reflux disease)   . THYROGLOSSAL DUCT CYST 07/18/2010  . POLYCYSTIC OVARY 12/17/2006    Past Surgical History:  Procedure Laterality Date  . ATRIAL TACH ABLATION N/A 08/14/2014   Procedure: ATRIAL TACH ABLATION;  Surgeon: Marinus MawGregg W Taylor, MD;  [facility];  Service: Cardiovascular;  Laterality: N/A;  . BI-VENTRICULAR  PACEMAKER UPGRADE N/A 03/08/2012   Procedure: BI-VENTRICULAR PACEMAKER UPGRADE;  Surgeon: Marinus MawGregg W Taylor, MD;  [facility];  Service: Cardiovascular;  Laterality: N/A;  . CARPAL TUNNEL WITH CUBITAL TUNNEL Right 07/26/2013   Procedure: RIGHT LIMITED OPEN CARPAL TUNNEL RELEASE ,  RIGHT CUBITAL TUNNEL RELEASE, INSITU VERSES ULNAR NERVE DECOMPRESSION AND ANTERIOR TRANSPOSITION;  Surgeon: Dominica SeverinWilliam Gramig, MD;  Location: [facility] OR;  Service: Orthopedics;  Laterality: Right;  . CESAREAN SECTION    . CHOLECYSTECTOMY    . CYST EXCISION  12/10/2012   THYROID  . INSERT / REPLACE / REMOVE PACEMAKER     2001  . IUD REMOVAL  11/03/2011   Procedure: INTRAUTERINE DEVICE (IUD) REMOVAL;  Surgeon: Myra C. Marice Potterove, MD;  Location: [facility] ORS;  Service: Gynecology;  Laterality: N/A;  . NASAL FRACTURE SURGERY     /w plate   . RIGHT HEART CATHETERIZATION N/A 10/26/2014   Procedure: RIGHT HEART CATH;  Surgeon: Dolores Pattyaniel R Bensimhon, MD;  [facility];  Service: Cardiovascular;  Laterality: N/A;  . THROAT SURGERY  1994   s/p laser treatment  . THYROGLOSSAL DUCT CYST N/A 12/10/2012   Procedure: REVISION OF THYROGLOSSAL DUCT CYST EXCISION;  Surgeon: Serena ColonelJefry Rosen, MD;  [facility];  Service: ENT;  Laterality: N/A;  Revision of Thyroglossal Duct Cyst Excision  . VIDEO BRONCHOSCOPY Bilateral 06/19/2014   Procedure: VIDEO BRONCHOSCOPY WITHOUT FLUORO;  Surgeon: Kalman ShanMurali Ramaswamy, MD;  [facility];  Service: Cardiopulmonary;  Laterality: Bilateral;    OB History    Gravida Para Term Preterm AB Living   1 1 1     1    SAB TAB Ectopic Multiple Live Births                   Home Medications    Prior to Admission medications   Medication Sig Start Date End Date Taking? Authorizing Provider  benzonatate (TESSALON) 200 MG capsule Take 1 capsule (200 mg total) by mouth 3 (three) times daily as needed for cough. 09/22/17  Yes Marquette SaaLancaster, Abigail Joseph, MD  bisoprolol (ZEBETA) 5 MG tablet Take 0.5 tablets (2.5 mg  total) by mouth daily. Take in AM 03/23/17  Yes Little IshikawaSmith, Erin E, NP  Etonogestrel [facility](NEXPLANON [place]) Inject into the skin.   Yes [provider]  furosemide (LASIX) 20 MG tablet TAKE 1 TABLET BY MOUTH EVERY DAY 05/15/17  Yes Marquette SaaLancaster, Abigail Joseph, MD  guaiFENesin-codeine 100-10 MG/5ML syrup Take 5 mLs by mouth every 4 (four) hours as needed for cough. 09/12/17  Yes Eustace MooreMurray, Laura W, MD  LANTUS SOLOSTAR 100 UNIT/ML Solostar Pen INJECT 15 UNITS SUBCUTANEOUSLY ONCE DAILY 05/15/17  Yes Marquette SaaLancaster, Abigail Joseph, MD  loratadine (CLARITIN) 10 MG tablet Take 1 tablet (10 mg total) by mouth daily. 06/26/17  Yes Arvilla MarketWallace, Catherine Lauren,  DO  metFORMIN (GLUCOPHAGE) 1000 MG tablet Take 1 tablet (1,000 mg total) 2 (two) times daily with a meal by mouth. 08/24/17  Yes Marquette Saa, MD  mycophenolate (CELLCEPT) 500 MG tablet Take 1,000 mg by mouth 2 (two) times daily.    Yes [provider]  NOVOLOG FLEXPEN 100 UNIT/ML FlexPen INJECT 8 UNITS INTO THE SKIN 3 (THREE) TIMES DAILY WITH MEALS. 09/30/17  Yes Marquette Saa, MD  OXYGEN Inhale 4 L into the lungs.   Yes [provider]  pantoprazole (PROTONIX) 40 MG tablet TAKE 1 TABLET (40MG  TOTAL) BY MOUTH DAILY 05/15/17  Yes 05/17/17, MD  potassium chloride SA (K-DUR,KLOR-CON) 20 MEQ tablet Take 1 tablet (20 mEq total) by mouth daily. 07/10/17  Yes 07/12/17, MD  tacrolimus (PROGRAF) 1 MG capsule Take 2 mg by mouth 2 (two) times daily.  01/26/17  Yes Ang, 03/28/17, MD  Vitamin D, Ergocalciferol, (DRISDOL) 50000 units CAPS capsule Take 50,000 Units by mouth once a week. 09/27/16  Yes [provider]  amoxicillin-clavulanate (AUGMENTIN) 875-125 MG tablet Take 1 tablet by mouth 2 (two) times daily. Patient not taking: Reported on 10/01/2017 06/26/17   08/26/17, DO  calcium-vitamin D (OSCAL WITH D) 500-200 MG-UNIT TABS tablet Take 1 tablet by mouth 2 (two) times daily with a meal. 09/03/17    [provider]  EASY TOUCH PEN NEEDLES 31G X 8 MM MISC USE AS DIRECTED 08/14/17   08/16/17, MD  fluticasone [facility]) 50 MCG/ACT nasal spray Place 2 sprays into both nostrils daily. Patient not taking: Reported on 10/01/2017 06/26/17   08/26/17, DO  fluticasone (FLONASE) 50 MCG/ACT nasal spray INSTILL TWO SPRAYS INTO BOTH NOSTRILS DAILY. Patient not taking: Reported on 10/01/2017 08/13/17   08/15/17, MD  ipratropium (ATROVENT) 0.06 % nasal spray Place 2 sprays into both nostrils 4 (four) times daily. Patient not taking: Reported on 10/01/2017 10/25/16   12/23/16, MD  norgestimate-ethinyl estradiol (SPRINTEC 28) 0.25-35 MG-MCG tablet Take 1 tablet by mouth daily. Patient not taking: Reported on 10/01/2017 05/13/17   05/15/17, MD  oseltamivir (TAMIFLU) 75 MG capsule Take 1 capsule (75 mg total) by mouth daily. Patient not taking: Reported on 10/01/2017 08/20/17   13/1/18, MD  predniSONE (DELTASONE) 10 MG tablet Take 2 tablets (20 mg total) by mouth daily for 8 days. 10/01/17 10/09/17  10/11/17, PA-C    Family History Family History  Problem Relation Age of Onset  . Heart disease Mother        CHF (no details)  . Hypertension Mother   . Lupus Mother   . Heart disease Father        Murmur  . Heart disease Sister 50        No details.  History of a pacemaker    Social History Social History   Tobacco Use  . Smoking status: Former Smoker    Packs/day: 0.25    Years: 0.50    Pack years: 0.12    Types: Cigarettes    Last attempt to quit: 07/25/1996    Years since quitting: 21.2  . Smokeless tobacco: Never Used  Substance Use Topics  . Alcohol use: No  . Drug use: No     Allergies   Atorvastatin; Sertraline hcl; and Tape   Review of Systems Review of Systems  Constitutional: Positive for appetite change and chills. Negative for fever.  HENT: Positive for congestion and  rhinorrhea. Negative for ear  pain, sneezing and sore throat.   Eyes: Negative for photophobia and visual disturbance.  Respiratory: Positive for cough and shortness of breath. Negative for chest tightness and wheezing.   Cardiovascular: Negative for chest pain and palpitations.  Gastrointestinal: Positive for abdominal pain, diarrhea and nausea. Negative for blood in stool, constipation and vomiting.  Genitourinary: Negative for dysuria, flank pain, hematuria, urgency, vaginal bleeding and vaginal discharge.  Musculoskeletal: Positive for myalgias.  Skin: Negative for rash.  Neurological: Negative for dizziness, weakness and light-headedness.     Physical Exam Updated Vital Signs BP (!) 95/59 (BP Location: Left Arm)   Pulse 85   Temp 98.2 F (36.8 C) (Oral)   Resp 17   SpO2 100%   Physical Exam  Constitutional: She appears well-developed and well-nourished. No distress.  Nontoxic appearing and in no acute distress. Wearing 2L O2 via nasal cannula here. Speaking in complete sentences.  HENT:  Head: Normocephalic and atraumatic.  Nose: Nose normal.  Eyes: Conjunctivae and EOM are normal. Right eye exhibits no discharge. Left eye exhibits no discharge. No scleral icterus.  Neck: Normal range of motion. Neck supple.  Cardiovascular: Normal rate, regular rhythm, normal heart sounds and intact distal pulses. Exam reveals no gallop and no friction rub.  No murmur heard. Pulmonary/Chest: Effort normal and breath sounds normal. No respiratory distress.  Abdominal: Soft. Bowel sounds are normal. She exhibits no distension. There is no tenderness. There is no guarding.  Normal bowel sounds with no abdominal tenderness to palpation.  Musculoskeletal: Normal range of motion. She exhibits no edema.  Neurological: She is alert. She exhibits normal muscle tone. Coordination normal.  Skin: Skin is warm and dry. No rash noted.  Psychiatric: She has a normal mood and affect.  Nursing note and vitals reviewed.    ED  Treatments / Results  Labs (all labs ordered are listed, but only abnormal results are displayed) Labs Reviewed  COMPREHENSIVE METABOLIC PANEL - Abnormal; Notable for the following components:      Result Value   Glucose, Bld 160 (*)    Albumin 3.3 (*)    AST 43 (*)    All other components within normal limits  CBC - Abnormal; Notable for the following components:   MCH 25.8 (*)    RDW 16.7 (*)    Platelets 407 (*)    All other components within normal limits  URINALYSIS, ROUTINE W REFLEX MICROSCOPIC - Abnormal; Notable for the following components:   Specific Gravity, Urine 1.031 (*)    Ketones, ur 5 (*)    Protein, ur 30 (*)    Bacteria, UA FEW (*)    Squamous Epithelial / LPF 0-5 (*)    All other components within normal limits  CK - Abnormal; Notable for the following components:   Total CK 2,842 (*)    All other components within normal limits  C DIFFICILE QUICK SCREEN W PCR REFLEX  GASTROINTESTINAL PANEL BY PCR, STOOL (REPLACES STOOL CULTURE)  LIPASE, BLOOD  I-STAT BETA HCG BLOOD, ED ([facility], [facility], [facility] ONLY)  I-STAT TROPONIN, ED    EKG  EKG Interpretation  Date/Time:  Thursday October 01 2017 13:54:48 EST Ventricular Rate:  91 PR Interval:    QRS Duration: 148 QT Interval:  443 QTC Calculation: 546 R Axis:   -135 Text Interpretation:  Sinus rhythm Nonspecific intraventricular conduction delay Consider inferior infarct Anterolateral infarct, old Baseline wander in lead(s) V2 Since last tracing rate slower Otherwise no significant change Confirmed  by Melene Plan [phone]) on 10/01/2017 1:57:07 PM       Radiology Dg Chest 2 View  Result Date: 10/01/2017 CLINICAL DATA:  Flu-like symptoms for the past week. Intermittently productive cough. History of asthma, CHF, former smoker. EXAM: CHEST  2 VIEW COMPARISON:  Chest x-ray of August 20, 2017 FINDINGS: The lungs are adequately inflated. The interstitial markings remain increased. There is no alveolar infiltrate or pleural  effusion. The cardiac silhouette is top-normal in size but stable. The pulmonary vascularity is in boards centrally. The ICD is in stable position. There is no acute bony abnormality. IMPRESSION: Chronic pulmonary fibrotic changes. Possible superimposed acute bronchitis but no focal pneumonia. Low-grade compensated CHF is suspected. Electronically Signed   By: David  [place] M.D.   On: 10/01/2017 16:37   Dg Abdomen 1 View  Result Date: 10/01/2017 CLINICAL DATA:  Diarrhea.  Abdominal pain . EXAM: ABDOMEN - 1 VIEW COMPARISON:  CT 01/04/2017.  Abdomen 09/24/2016 . FINDINGS: Soft tissue structures are unremarkable. Dilated loops of small bowel noted. Air-filled colon is noted. Differential diagnosis includes adynamic ileus and bowel obstruction, possibly partial small bowel obstruction. Follow-up exam suggested to demonstrate resolution. No free air identified. Pelvic calcifications most likely phleboliths. No acute bony abnormality. IMPRESSION: Dilated loops of small bowel noted. Air-filled nondilated colon noted. Differential diagnosis includes adynamic ileus and bowel obstruction, possibly partial small-bowel obstruction. Follow-up exam suggested to demonstrate resolution. Electronically Signed   By: Maisie Fus  Register   On: 10/01/2017 16:58    Procedures Procedures (including critical care time)  Medications Ordered in ED Medications - No data to display   Initial Impression / Assessment and Plan / ED Course  I have reviewed the triage vital signs and the nursing notes.  Pertinent labs & imaging results that were available during my care of the patient were reviewed by me and considered in my medical decision making (see chart for details).     Patient, with a past medical history of ILD, CHF, hypertension, diabetes, lupus, presents to ED for evaluation of generalized body aches, productive cough, diarrhea, abdominal pain and URI symptoms for the past 3 weeks.  Evaluated by urgent care x2 and  primary care provider for similar symptoms.  She is now on 2 L of nasal cannula oxygen for the past 2 weeks due to her shortness of breath.  She is usually on 2-4 L at home as needed.  She reports compliance with her home steroids, CellCept, insulin, metformin and tacrolimus.  Here her lungs are clear to auscultation bilaterally.  She is on oxygen at 3 L.  She is satting at 99-100%.  She denies any vomiting.  Chest x-ray showed chronic changes due to her ILD.  CBC, CMP unremarkable.  Lipase normal.  Her CK was elevated to 2800.  With further chart review this has been elevated from a week ago when it was 1900.  Urinalysis with no evidence of UTI.  Troponin negative x1.  EKG with tracing similar to previous.  Her abdominal x-ray did show possible small bowel obstruction.  However, patient denies any constipation or vomiting.  She was able to produce a stool sample here in the ED which was negative for C. difficile.  She has been tolerating p.o. intake with no vomiting here in the ED.  I suspect that this is actually an enteritis. I spoke to her your rheumatologist Dr. Basilia Jumbo at [facility] who recommends that we recheck her CK and check her stool culture.  States that most  likely this is a flareup of her myositis, or a viral illness that is difficult for her to cleared based on her chronic medical conditions.  The arranged appointment with her in about a week which I told the patient about.  They recommend that we increase patient's dose of prednisone to 20 mg daily until evaluated by rheumatology in a week.  She remains in no acute distress on today's ED visit.  I did tell her that she should follow-up with the results of her stool culture when available and to follow-up with her primary care provider as well. I have low suspicion for acute intraabdominal or cardiac cause of her symptoms based on today's lab work and imaging.  Patient appears stable for discharge at this time.  Strict return precautions  given.  Patient discussed with Dr. Fayrene Fearing, who is in agreement with plan.  Portions of this note were generated with Scientist, clinical (histocompatibility and immunogenetics). Dictation errors may occur despite best attempts at proofreading.   Final Clinical Impressions(s) / ED Diagnoses   Final diagnoses:  Viral upper respiratory tract infection    ED Discharge Orders        Ordered    predniSONE (DELTASONE) 10 MG tablet  Daily     10/01/17 1955       Dietrich Pates, [place] 10/01/17 2016    Rolland Porter, MD 10/09/17 0003

## 2017-10-01 NOTE — ED Triage Notes (Signed)
Patient adds that she has lupus and having muscle weakness and her doctor in [place] told her to come to ED.

## 2017-10-01 NOTE — Discharge Instructions (Signed)
Please read the attached information regarding your condition. Increase your prednisone dose to 20 mg once daily until evaluated by your rheumatologist. Your rheumatologist has an appointment scheduled for you on October 08, 2017 at 3:30 PM. Wear oxygen as needed. Return to ED for worsening symptoms, chest pain, trouble breathing, vomiting, vomiting or coughing up blood.

## 2017-10-01 NOTE — ED Triage Notes (Signed)
Per [facility] patient from home for flu like symptoms for week. Patient reports intermittent productive cough with clear sputum.  Patient on O2 at home as needed and was on it when EMS arrived to home. 98% on 3 liters. Vitals: 104/68, 98HR, 18R, CBG 164.

## 2017-10-02 DIAGNOSIS — B999 Unspecified infectious disease: Secondary | ICD-10-CM | POA: Diagnosis not present

## 2017-10-02 DIAGNOSIS — R112 Nausea with vomiting, unspecified: Secondary | ICD-10-CM | POA: Diagnosis not present

## 2017-10-02 DIAGNOSIS — J80 Acute respiratory distress syndrome: Secondary | ICD-10-CM | POA: Diagnosis not present

## 2017-10-02 LAB — GASTROINTESTINAL PANEL BY PCR, STOOL (REPLACES STOOL CULTURE)

## 2017-10-08 DIAGNOSIS — M6281 Muscle weakness (generalized): Secondary | ICD-10-CM | POA: Diagnosis not present

## 2017-10-08 DIAGNOSIS — M3320 Polymyositis, organ involvement unspecified: Secondary | ICD-10-CM | POA: Diagnosis not present

## 2017-10-08 DIAGNOSIS — R109 Unspecified abdominal pain: Secondary | ICD-10-CM | POA: Diagnosis not present

## 2017-10-08 DIAGNOSIS — R197 Diarrhea, unspecified: Secondary | ICD-10-CM | POA: Diagnosis not present

## 2017-10-08 DIAGNOSIS — R110 Nausea: Secondary | ICD-10-CM | POA: Diagnosis not present

## 2017-10-08 DIAGNOSIS — Z79899 Other long term (current) drug therapy: Secondary | ICD-10-CM | POA: Diagnosis not present

## 2017-10-08 DIAGNOSIS — K6389 Other specified diseases of intestine: Secondary | ICD-10-CM | POA: Diagnosis not present

## 2017-10-16 ENCOUNTER — Other Ambulatory Visit (HOSPITAL_COMMUNITY): Payer: Self-pay

## 2017-10-16 MED ORDER — BISOPROLOL FUMARATE 5 MG PO TABS: 2.5000 mg | ORAL_TABLET | Freq: Every day | ORAL | 6 refills | Status: DC

## 2017-10-21 ENCOUNTER — Telehealth (HOSPITAL_COMMUNITY): Payer: Self-pay | Admitting: Pharmacist

## 2017-10-21 MED ORDER — CARVEDILOL 3.125 MG PO TABS: 3.1250 mg | ORAL_TABLET | Freq: Two times a day (BID) | ORAL | 3 refills | Status: DC

## 2017-10-21 NOTE — Telephone Encounter (Signed)
Since bisoprolol on backorder until at least February, per discussion with Dr. Shirlee Latch will switch to carvedilol 3.125 mg BID in the meantime. Rx sent to [facility] on [address]. Patient aware and agreeable to change.   Makayla Vasquez. Bonnye Fava, PharmD, BCPS, CPP Clinical Pharmacist Pager: [phone] Phone: [phone] 10/21/2017 12:19 PM

## 2017-10-23 ENCOUNTER — Ambulatory Visit (INDEPENDENT_AMBULATORY_CARE_PROVIDER_SITE_OTHER): Payer: Medicare Other

## 2017-10-23 ENCOUNTER — Encounter (HOSPITAL_COMMUNITY): Payer: Self-pay | Admitting: *Deleted

## 2017-10-23 ENCOUNTER — Ambulatory Visit (HOSPITAL_COMMUNITY)
Admission: EM | Admit: 2017-10-23 | Discharge: 2017-10-23 | Disposition: A | Discharge status: Home / Self Care | Payer: Medicare Other

## 2017-10-23 DIAGNOSIS — M25561 Pain in right knee: Secondary | ICD-10-CM | POA: Diagnosis not present

## 2017-10-23 MED ORDER — METHYLPREDNISOLONE SODIUM SUCC 125 MG IJ SOLR
125.0000 mg | Freq: Once | INTRAMUSCULAR | Status: AC
  Administered 2017-10-23: 125 mg via INTRAMUSCULAR

## 2017-10-23 MED ORDER — METHYLPREDNISOLONE SODIUM SUCC 125 MG IJ SOLR
INTRAMUSCULAR | Status: AC
  Filled 2017-10-23: qty 2

## 2017-10-23 NOTE — Discharge Instructions (Addendum)
You were given a steroid shot today (Solu-Medrol) to help with pain in your right knee. Recommend wear knee sleeve for support. Elevate leg and alternate ice and heat as needed for comfort. May also take Tylenol 1000mg  every 8 hours as needed for pain. Recommend call your Orthopedic who you have seen in the past ([facility]) today to schedule appointment for further evaluation.

## 2017-10-23 NOTE — ED Triage Notes (Signed)
Patient reports right knee pain x 1 week. States that she can walk on right leg but has discomfort with bending. Denies injury.

## 2017-10-23 NOTE — ED Provider Notes (Signed)
[facility]    CSN: [id number] Arrival date & time: 10/23/17  1007     History   Chief Complaint Chief Complaint  Patient presents with  . Knee Pain    HPI Makayla Vasquez is a 37 y.o. female.   37 year old female presents with right knee pain for over 1 week. Woke up with pain and has noticed more difficulty and more pain with bending knee, especially in lateral area. Some swelling present. No distinct injury or trauma. Has not taken any additional medication for pain. No previous history of injury or arthritis in right knee. Currently on Prednisone 20mg  daily for breathing issues and recent URI. Also has history of heart failure, HTN, insulin- dependent DM, lupus, chronic kidney disease and bipolar disorder. On multiple medications for management.    The history is provided by the patient.    Past Medical History:  Diagnosis Date  . Anxiety   . Arthritis    rheumatoid arthritis- mild, no rheumatology care   . Asthma    seasonal allergies   . Asymptomatic LV dysfunction    a. Echo in Dec 2011 with EF 35 to 40%. Felt to be due to paced rhythm. b. EF 25-30% in 07/2014.  . Bipolar affective disorder (HCC)   . Cardiac pacemaker    a. Since age 36 in 49. b. Upgrade to BiV in 2013.  2014 Carpal tunnel syndrome of right wrist   . CHF (congestive heart failure) (HCC)   . Congenital complete AV block   . Depression    bipolar  . Diabetes mellitus without complication (HCC)   . GERD (gastroesophageal reflux disease)   . Hypertension   . Interstitial lung disease (HCC)   . Lupus   . Lupus (systemic lupus erythematosus) (HCC)   . Obesity   . Pneumonitis    a. a/w hypoxia - inflammatory - large workup 07/2014.  . Presence of permanent cardiac pacemaker   . Seizures (HCC)    as a child- from high fever  . Sinus tachycardia     Patient Active Problem List   Diagnosis Date Noted  . Concern about breast cancer in female without diagnosis 08/25/2017  . Acute upper  respiratory infection 08/20/2017  . Dyspareunia, female 05/14/2017  . 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 On Cellcept therapy   . On prednisone therapy   . Systemic lupus erythematosus (HCC)   . Long term (current) use of systemic steroids   . Chronic diastolic congestive heart failure (HCC)   . Cough   . Anemia of chronic disease   . Bipolar affective disorder in remission (HCC)   . Cardiac pacemaker in situ   . Adrenal insufficiency (HCC)   . Antisynthetase syndrome (HCC)   . Idiopathic hypotension 07/08/2015  . Hyperlipidemia associated with type 2 diabetes mellitus (HCC) 05/04/2015  . Diabetes mellitus type 2 in obese (HCC)   . Chronic respiratory failure with hypoxia (HCC) 08/07/2014  . Insertion of implantable subdermal contraceptive 07/12/2014  . ILD (interstitial lung disease) (HCC) 06/19/2014  . Congestive dilated cardiomyopathy (HCC) 05/31/2014  . Chronic systolic heart failure (HCC) 02/26/2012  . PPM-Medtronic   . Congenital complete AV block   . GERD (gastroesophageal reflux disease)   . THYROGLOSSAL DUCT CYST 07/18/2010  . POLYCYSTIC OVARY 12/17/2006    Past Surgical History:  Procedure Laterality Date  . ATRIAL TACH ABLATION N/A 08/14/2014   Procedure: ATRIAL TACH ABLATION;  Surgeon: 08/16/2014, MD;  Location: [facility]  CATH LAB;  Service: Cardiovascular;  Laterality: N/A;  . BI-VENTRICULAR PACEMAKER UPGRADE N/A 03/08/2012   Procedure: BI-VENTRICULAR PACEMAKER UPGRADE;  Surgeon: Marinus Maw, MD;  Location: [facility] CATH LAB;  Service: Cardiovascular;  Laterality: N/A;  . CARPAL TUNNEL WITH CUBITAL TUNNEL Right 07/26/2013   Procedure: RIGHT LIMITED OPEN CARPAL TUNNEL RELEASE ,  RIGHT CUBITAL TUNNEL RELEASE, INSITU VERSES ULNAR NERVE DECOMPRESSION AND ANTERIOR TRANSPOSITION;  Surgeon: Dominica Severin, MD;  Location: [facility] OR;  Service: Orthopedics;  Laterality: Right;  . CESAREAN SECTION    . CHOLECYSTECTOMY    . CYST EXCISION  12/10/2012   THYROID  . INSERT / REPLACE / REMOVE  PACEMAKER     2001  . IUD REMOVAL  11/03/2011   Procedure: INTRAUTERINE DEVICE (IUD) REMOVAL;  Surgeon: Myra C. Marice Potter, MD;  Location: [facility] ORS;  Service: Gynecology;  Laterality: N/A;  . NASAL FRACTURE SURGERY     /w plate   . RIGHT HEART CATHETERIZATION N/A 10/26/2014   Procedure: RIGHT HEART CATH;  Surgeon: Dolores Patty, MD;  Location: [facility] CATH LAB;  Service: Cardiovascular;  Laterality: N/A;  . THROAT SURGERY  1994   s/p laser treatment  . THYROGLOSSAL DUCT CYST N/A 12/10/2012   Procedure: REVISION OF THYROGLOSSAL DUCT CYST EXCISION;  Surgeon: Serena Colonel, MD;  Location: [facility] OR;  Service: ENT;  Laterality: N/A;  Revision of Thyroglossal Duct Cyst Excision  . VIDEO BRONCHOSCOPY Bilateral 06/19/2014   Procedure: VIDEO BRONCHOSCOPY WITHOUT FLUORO;  Surgeon: Kalman Shan, MD;  Location: [facility] ENDOSCOPY;  Service: Cardiopulmonary;  Laterality: Bilateral;    OB History    Gravida Para Term Preterm AB Living   1 1 1     1    SAB TAB Ectopic Multiple Live Births                   Home Medications    Prior to Admission medications   Medication Sig Start Date End Date Taking? Authorizing Provider  benzonatate (TESSALON) 200 MG capsule Take 1 capsule (200 mg total) by mouth 3 (three) times daily as needed for cough. 09/22/17  Yes Marquette Saa, MD  bisoprolol (ZEBETA) 5 MG tablet Take 5 mg by mouth daily.   Yes [provider]  calcium-vitamin D (OSCAL WITH D) 500-200 MG-UNIT TABS tablet Take 1 tablet by mouth 2 (two) times daily with a meal. 09/03/17  Yes [provider]  furosemide (LASIX) 20 MG tablet TAKE 1 TABLET BY MOUTH EVERY DAY 05/15/17  Yes Marquette Saa, MD  LANTUS SOLOSTAR 100 UNIT/ML Solostar Pen INJECT 15 UNITS SUBCUTANEOUSLY ONCE DAILY 05/15/17  Yes Marquette Saa, MD  loratadine (CLARITIN) 10 MG tablet Take 1 tablet (10 mg total) by mouth daily. 06/26/17  Yes Arvilla Market, DO  metFORMIN (GLUCOPHAGE) 1000 MG tablet  Take 1 tablet (1,000 mg total) 2 (two) times daily with a meal by mouth. 08/24/17  Yes Marquette Saa, MD  mycophenolate (CELLCEPT) 500 MG tablet Take 1,000 mg by mouth 2 (two) times daily.    Yes [provider]  NOVOLOG FLEXPEN 100 UNIT/ML FlexPen INJECT 8 UNITS INTO THE SKIN 3 (THREE) TIMES DAILY WITH MEALS. 09/30/17  Yes Marquette Saa, MD  OXYGEN Inhale 4 L into the lungs.   Yes [provider]  pantoprazole (PROTONIX) 40 MG tablet TAKE 1 TABLET (40MG  TOTAL) BY MOUTH DAILY 05/15/17  Yes Marquette Saa, MD  potassium chloride SA (K-DUR,KLOR-CON) 20 MEQ tablet Take 1 tablet (20 mEq total) by  mouth daily. 07/10/17  Yes Laurey Morale, MD  tacrolimus (PROGRAF) 1 MG capsule Take 2 mg by mouth 2 (two) times daily.  01/26/17  Yes Ang, Lynne Leader, MD  Vitamin D, Ergocalciferol, (DRISDOL) 50000 units CAPS capsule Take 50,000 Units by mouth once a week. 09/27/16  Yes [provider]  carvedilol (COREG) 3.125 MG tablet Take 1 tablet (3.125 mg total) by mouth 2 (two) times daily. 10/21/17 01/19/18  Laurey Morale, MD  EASY TOUCH PEN NEEDLES 31G X 8 MM MISC USE AS DIRECTED 08/14/17   Marquette Saa, MD  Etonogestrel [facility]) Inject into the skin.    [provider]    Family History Family History  Problem Relation Age of Onset  . Heart disease Mother        CHF (no details)  . Hypertension Mother   . Lupus Mother   . Heart disease Father        Murmur  . Heart disease Sister 33        No details.  History of a pacemaker    Social History Social History   Tobacco Use  . Smoking status: Former Smoker    Packs/day: 0.25    Years: 0.50    Pack years: 0.12    Types: Cigarettes    Last attempt to quit: 07/25/1996    Years since quitting: 21.2  . Smokeless tobacco: Never Used  Substance Use Topics  . Alcohol use: No  . Drug use: No     Allergies   Atorvastatin; Sertraline hcl; and Tape   Review of  Systems Review of Systems  Constitutional: Negative for activity change, appetite change, chills, fatigue and fever.  HENT: Negative for congestion, sore throat and trouble swallowing.   Respiratory: Positive for cough and wheezing. Negative for shortness of breath and stridor.   Cardiovascular: Negative for chest pain.  Gastrointestinal: Negative for diarrhea, nausea and vomiting.  Musculoskeletal: Positive for arthralgias, joint swelling and myalgias.  Skin: Negative for color change, rash and wound.  Allergic/Immunologic: Positive for immunocompromised state.  Neurological: Negative for dizziness, tremors, seizures, syncope, weakness, numbness and headaches.     Physical Exam Triage Vital Signs ED Triage Vitals  Enc Vitals Group     BP --      Pulse Rate 10/23/17 1054 97     Resp 10/23/17 1054 (!) 23     Temp 10/23/17 1054 98 F (36.7 C)     Temp Source 10/23/17 1054 Oral     SpO2 --      Weight --      Height --      Head Circumference --      Peak Flow --      Pain Score 10/23/17 1051 6     Pain Loc --      Pain Edu? --      Excl. in GC? --    No data found.  Updated Vital Signs Pulse 97   Temp 98 F (36.7 C) (Oral)   Resp (!) 23   Visual Acuity Right Eye Distance:   Left Eye Distance:   Bilateral Distance:    Right Eye Near:   Left Eye Near:    Bilateral Near:     Physical Exam  Constitutional: She is oriented to person, place, and time. She appears well-developed and well-nourished. She is cooperative. No distress. Nasal cannula in place.  Patient on portable oxygen.   HENT:  Head: Normocephalic and atraumatic.  Eyes:  Conjunctivae and EOM are normal.  Cardiovascular: Normal rate.  Pulmonary/Chest: Tachypnea noted. No respiratory distress.  Musculoskeletal: Normal range of motion. She exhibits tenderness.       Right knee: She exhibits swelling. She exhibits no effusion, no ecchymosis, no deformity, no laceration, no erythema, normal alignment, no LCL  laxity and normal patellar mobility. Tenderness found. Lateral joint line tenderness noted.       Legs: Has full range of motion of right knee but with pain with full extension. Slightly tender with mild swelling present on lateral aspect of patella. No bruising seen. Good distal pulses and sensation. No neuro deficits noted.   Neurological: She is alert and oriented to person, place, and time. She has normal strength. No sensory deficit.  Skin: Skin is warm and dry. No rash noted. No erythema.  Psychiatric: She has a normal mood and affect. Her behavior is normal.     UC Treatments / Results  Labs (all labs ordered are listed, but only abnormal results are displayed) Labs Reviewed - No data to display  EKG  EKG Interpretation None       Radiology Dg Knee Complete 4 Views Right  Result Date: 10/23/2017 CLINICAL DATA:  Right knee pain for 1 week EXAM: RIGHT KNEE - COMPLETE 4+ VIEW COMPARISON:  None. FINDINGS: No evidence of fracture, dislocation, or joint effusion. No evidence of arthropathy or other focal bone abnormality. Soft tissues are unremarkable. IMPRESSION: Negative. Electronically Signed   By: Charlett Nose M.D.   On: 10/23/2017 11:47    Procedures Procedures (including critical care time)  Medications Ordered in UC Medications  methylPREDNISolone sodium succinate (SOLU-MEDROL) 125 mg/2 mL injection 125 mg (125 mg Intramuscular Given 10/23/17 1241)     Initial Impression / Assessment and Plan / UC Course  I have reviewed the triage vital signs and the nursing notes.  Pertinent labs & imaging results that were available during my care of the patient were reviewed by me and considered in my medical decision making (see chart for details).    Reviewed negative x-ray results with patient. Since history of kidney disease, no oral NSAIDs recommended. Gave SoluMedrol 125mg  IM now to help with pain. Wear knee sleeve for support. Elevate leg and alternate ice and heat as needed  for comfort. May also take Tylenol 1000mg  every 8 hours as needed for pain. Recommend call her Orthopedic who she has seen in the past ([facility]) today to schedule appointment for further evaluation.    Final Clinical Impressions(s) / UC Diagnoses   Final diagnoses:  Acute pain of right knee    ED Discharge Orders    None       Controlled Substance Prescriptions [place] Controlled Substance Registry consulted? Not Applicable   , NP 10/24/17 [phone]

## 2017-10-28 IMAGING — DX DG CHEST 2V
2 series · 2 of 2 positions shown · non-contrast
Comparison: 07/08/2015

CLINICAL DATA: Shortness of breath

EXAM:
CHEST  2 VIEW

[chest pa]
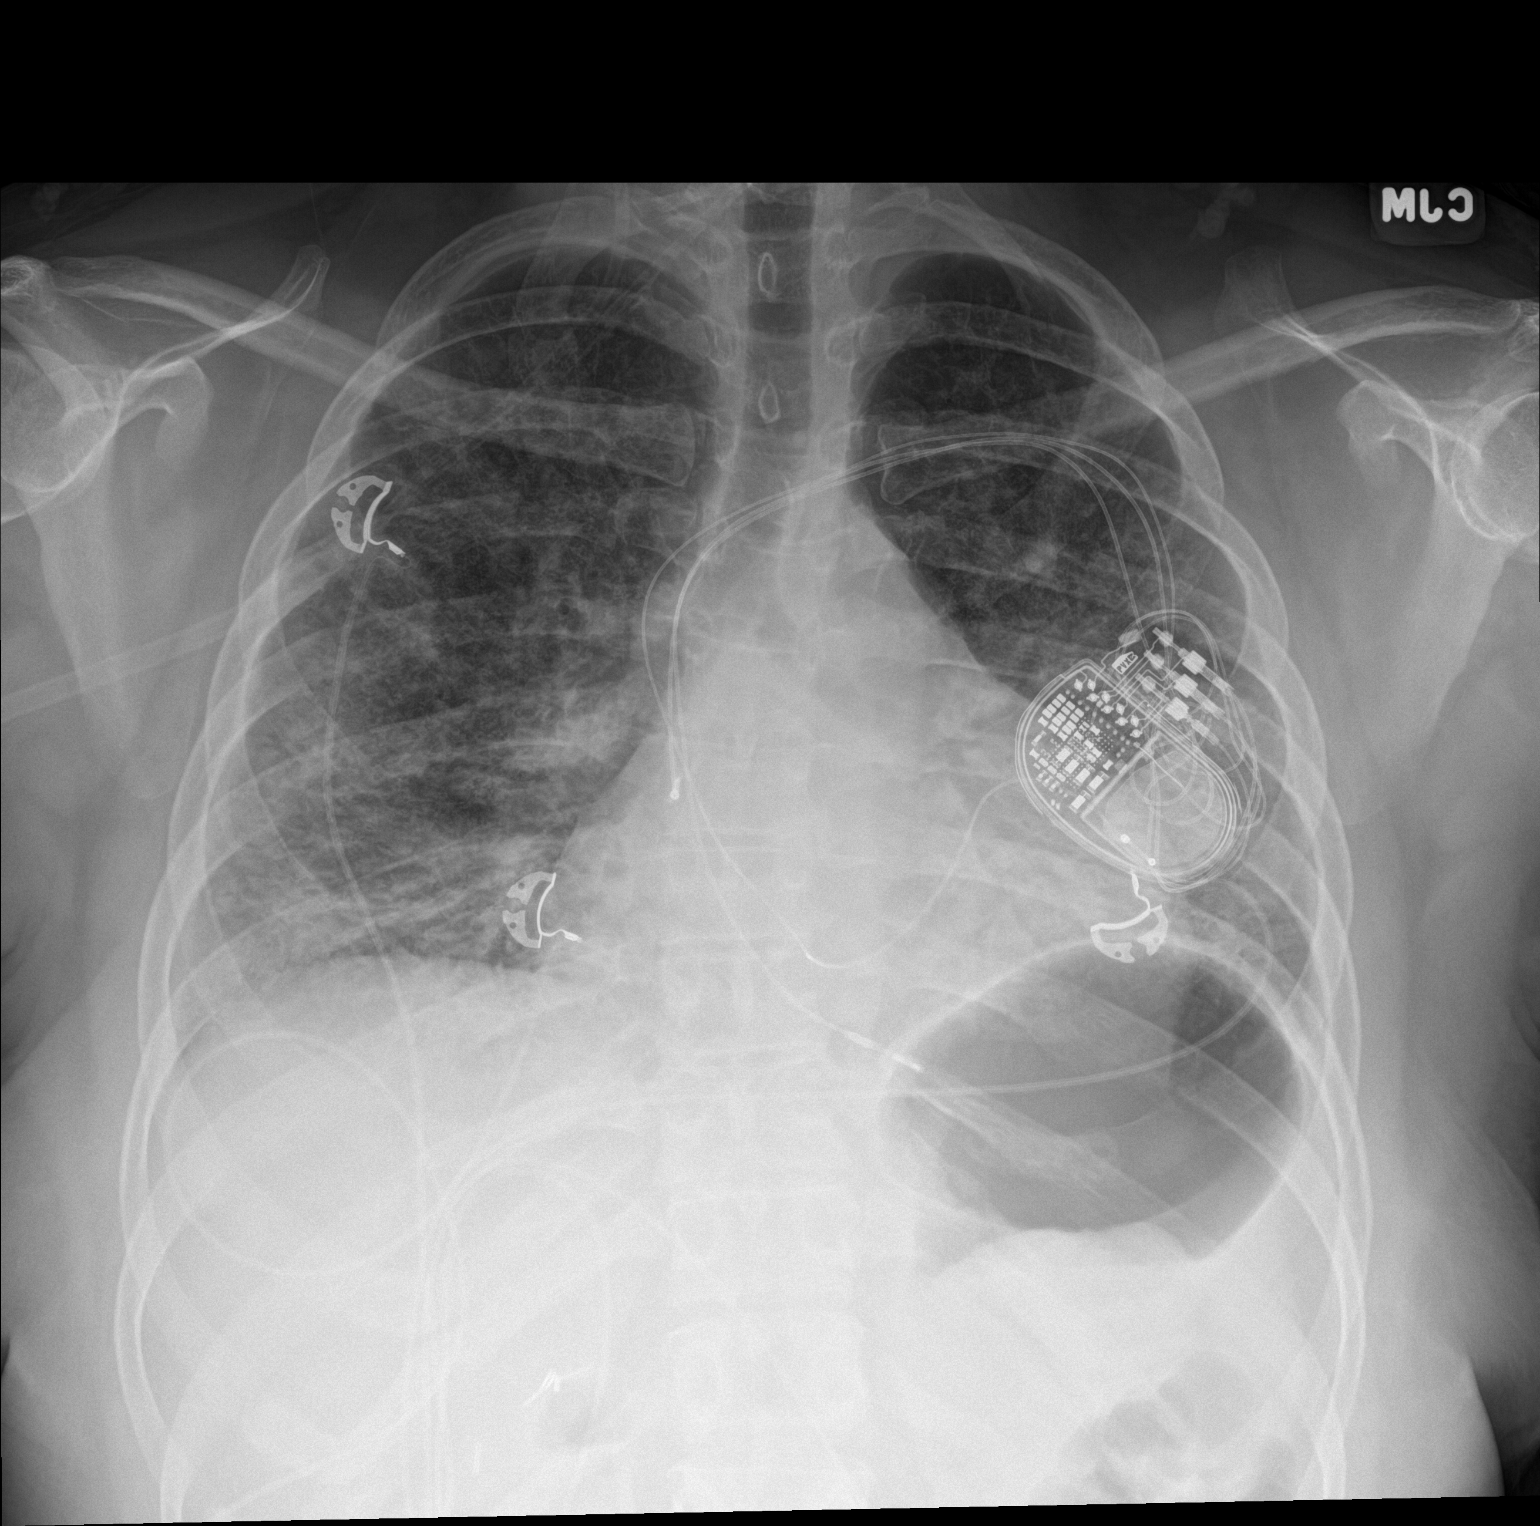

[chest lat]
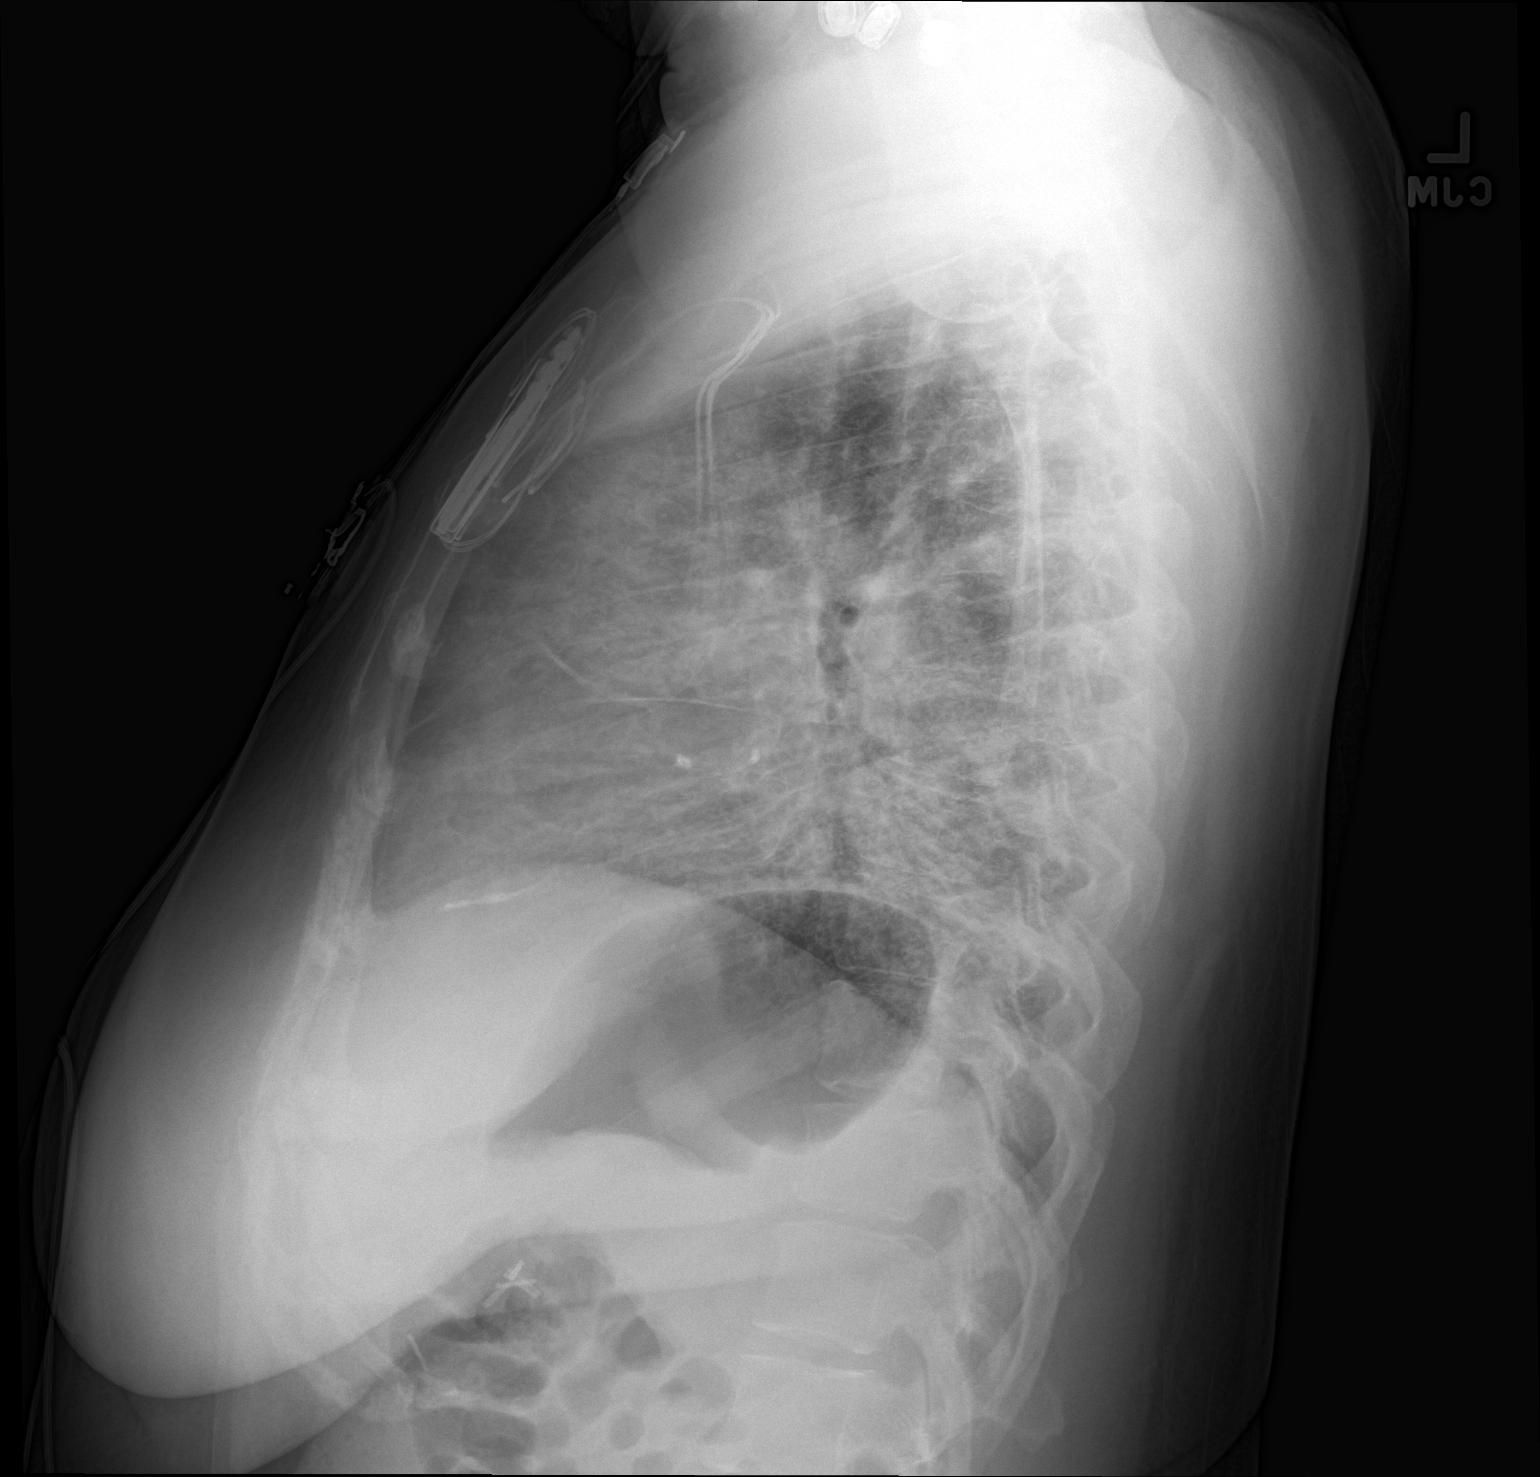

[2 of 2 positions shown; findings below may reference images not displayed]

FINDINGS: Left chest wall ICD is noted with leads in the right atrial
appendage, coronary sinus and right ventricle. There is moderate
cardiac enlargement noted. Bibasilar atelectasis. A moderate
interstitial edema is noted.
IMPRESSION: 1. Cardiac enlargement and moderate interstitial edema consistent
with CHF.

## 2017-10-29 ENCOUNTER — Telehealth (HOSPITAL_COMMUNITY): Payer: Self-pay | Admitting: *Deleted

## 2017-10-29 NOTE — Telephone Encounter (Signed)
Advanced Heart Failure Triage Encounter  Patient Name: Makayla Vasquez  Date of Call: 10/29/17  Problem:  Patient called requested for an appointment to be seen.  She complains of having heart palpitations along with a dry cough.  She said this has been going on for several weeks now.  She stated she is not experiencing any other symptoms and wants to be seen as soon as possible.    Plan:  I have scheduled patient in the APP clinic for next week. Patient was very grateful and no further questions.   Georgina Peer, RN

## 2017-10-30 DIAGNOSIS — Z79899 Other long term (current) drug therapy: Secondary | ICD-10-CM | POA: Diagnosis not present

## 2017-10-30 DIAGNOSIS — K582 Mixed irritable bowel syndrome: Secondary | ICD-10-CM | POA: Diagnosis not present

## 2017-10-30 DIAGNOSIS — D509 Iron deficiency anemia, unspecified: Secondary | ICD-10-CM | POA: Diagnosis not present

## 2017-10-30 DIAGNOSIS — K219 Gastro-esophageal reflux disease without esophagitis: Secondary | ICD-10-CM | POA: Diagnosis not present

## 2017-11-02 ENCOUNTER — Telehealth (HOSPITAL_COMMUNITY): Payer: Self-pay

## 2017-11-02 NOTE — Telephone Encounter (Signed)
CHF Clinic appointment reminder call placed to patient for upcoming follow up.  Does understand purpose of this appointment and where CHF Clinic is located? Yes  How is patient feeling? Still c/o palpitations and dry couhg, no swelling, fever, chills, or issues otherwise noted.  Does patient have all of their medications? Yes  Patient also reminded to take all medications as prescribed on the day of his/her appointment and to bring all medications to this appointment.  Advised to call our office for tardiness or cancellations/rescheduling needs.  Makayla Vasquez, Bettina Gavia

## 2017-11-03 ENCOUNTER — Encounter (HOSPITAL_COMMUNITY): Payer: Self-pay

## 2017-11-03 ENCOUNTER — Ambulatory Visit (HOSPITAL_COMMUNITY)
Admission: RE | Admit: 2017-11-03 | Discharge: 2017-11-03 | Disposition: A | Discharge status: Home / Self Care | Payer: Medicare Other | Source: Ambulatory Visit | Attending: Cardiology | Admitting: Cardiology

## 2017-11-03 VITALS — BP 138/88 | HR 112 | Wt 154.2 lb

## 2017-11-03 DIAGNOSIS — Z79899 Other long term (current) drug therapy: Secondary | ICD-10-CM | POA: Insufficient documentation

## 2017-11-03 DIAGNOSIS — E669 Obesity, unspecified: Secondary | ICD-10-CM | POA: Diagnosis not present

## 2017-11-03 DIAGNOSIS — Z6825 Body mass index (BMI) 25.0-25.9, adult: Secondary | ICD-10-CM | POA: Insufficient documentation

## 2017-11-03 DIAGNOSIS — Z87891 Personal history of nicotine dependence: Secondary | ICD-10-CM | POA: Diagnosis not present

## 2017-11-03 DIAGNOSIS — I110 Hypertensive heart disease with heart failure: Secondary | ICD-10-CM | POA: Insufficient documentation

## 2017-11-03 DIAGNOSIS — J9610 Chronic respiratory failure, unspecified whether with hypoxia or hypercapnia: Secondary | ICD-10-CM | POA: Diagnosis not present

## 2017-11-03 DIAGNOSIS — M069 Rheumatoid arthritis, unspecified: Secondary | ICD-10-CM | POA: Diagnosis not present

## 2017-11-03 DIAGNOSIS — Z95810 Presence of automatic (implantable) cardiac defibrillator: Secondary | ICD-10-CM | POA: Diagnosis not present

## 2017-11-03 DIAGNOSIS — K219 Gastro-esophageal reflux disease without esophagitis: Secondary | ICD-10-CM | POA: Insufficient documentation

## 2017-11-03 DIAGNOSIS — Z794 Long term (current) use of insulin: Secondary | ICD-10-CM | POA: Diagnosis not present

## 2017-11-03 DIAGNOSIS — Z9109 Other allergy status, other than to drugs and biological substances: Secondary | ICD-10-CM | POA: Diagnosis not present

## 2017-11-03 DIAGNOSIS — Z888 Allergy status to other drugs, medicaments and biological substances status: Secondary | ICD-10-CM | POA: Diagnosis not present

## 2017-11-03 DIAGNOSIS — I5032 Chronic diastolic (congestive) heart failure: Secondary | ICD-10-CM | POA: Diagnosis not present

## 2017-11-03 DIAGNOSIS — Z9889 Other specified postprocedural states: Secondary | ICD-10-CM | POA: Insufficient documentation

## 2017-11-03 DIAGNOSIS — I5022 Chronic systolic (congestive) heart failure: Secondary | ICD-10-CM | POA: Insufficient documentation

## 2017-11-03 DIAGNOSIS — Z8269 Family history of other diseases of the musculoskeletal system and connective tissue: Secondary | ICD-10-CM | POA: Insufficient documentation

## 2017-11-03 DIAGNOSIS — I429 Cardiomyopathy, unspecified: Secondary | ICD-10-CM | POA: Diagnosis not present

## 2017-11-03 DIAGNOSIS — Q246 Congenital heart block: Secondary | ICD-10-CM | POA: Diagnosis not present

## 2017-11-03 DIAGNOSIS — Z9981 Dependence on supplemental oxygen: Secondary | ICD-10-CM | POA: Diagnosis not present

## 2017-11-03 DIAGNOSIS — Z8249 Family history of ischemic heart disease and other diseases of the circulatory system: Secondary | ICD-10-CM | POA: Insufficient documentation

## 2017-11-03 DIAGNOSIS — I442 Atrioventricular block, complete: Secondary | ICD-10-CM | POA: Diagnosis not present

## 2017-11-03 DIAGNOSIS — M329 Systemic lupus erythematosus, unspecified: Secondary | ICD-10-CM | POA: Insufficient documentation

## 2017-11-03 DIAGNOSIS — J9611 Chronic respiratory failure with hypoxia: Secondary | ICD-10-CM | POA: Diagnosis not present

## 2017-11-03 DIAGNOSIS — J45909 Unspecified asthma, uncomplicated: Secondary | ICD-10-CM | POA: Insufficient documentation

## 2017-11-03 DIAGNOSIS — J849 Interstitial pulmonary disease, unspecified: Secondary | ICD-10-CM | POA: Insufficient documentation

## 2017-11-03 DIAGNOSIS — E119 Type 2 diabetes mellitus without complications: Secondary | ICD-10-CM | POA: Insufficient documentation

## 2017-11-03 MED ORDER — FUROSEMIDE 20 MG PO TABS: 20.0000 mg | ORAL_TABLET | Freq: Every day | ORAL | 3 refills | Status: DC

## 2017-11-03 NOTE — Patient Instructions (Addendum)
Take Lasix 40 mg (2 tabs) once daily today and tomorrow. Then take 20 mg (1 tab) once daily every day.  Will schedule you for an echocardiogram at [facility]. Address: [address] ([address] Floor), [place], [place] [address]  Phone: [phone] They will contact you by phone to schedule.  Follow up 4 weeks with Amy Clegg NP-C.  Take all medication as prescribed the day of your appointment. Bring all medications with you to your appointment.  Do the following things EVERYDAY: 1) Weigh yourself in the morning before breakfast. Write it down and keep it in a log. 2) Take your medicines as prescribed 3) Eat low salt foods-Limit salt (sodium) to 2000 mg per day.  4) Stay as active as you can everyday 5) Limit all fluids for the day to less than 2 liters

## 2017-11-03 NOTE — Progress Notes (Signed)
Patient ID: JOHNANNA BAKKE, female   DOB: May 02, 1981, 37 y.o.   MRN: [id number] PCP: Dr Natale Milch Cardiology: Dr Shirlee Latch Pulmonary: Dr Ang [facility] and Dr Maple Hudson.   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   She has a h/o congenital HB and underwent first PPM at age 37. In 2011 had ECHO with EF 35-40%. LV dysfunction felt to be due to RV pacing so upgraded to CRT-P. Echo in 10/15 with EF 25%.  She also has concomitant lung disease and this has limited b-blocker dosing.  She follows with Dr. Maple Hudson for her lung disease.Admitted in 8/15 and 9/15 with worsening respiratory status and severe hypoxia. Had extensive w/u. Thought to have a viral pneumonitis complicated by atelectasis and mild edema vs. an autoimmune process. Discharged home on a course of levaquin and prednisone.   She was offered a lung biopsy for further diagnosis versus empiric treatment with steroids. She opted for empiric steroid treatment. She is followed by a rheumatologist and is on Cellcept and prednisone taper. 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  She is on oxygen and night and with activity.   Today she returns for HF follow up. Complaining of increasing shortness of breath and palpitations.  Over the past week she has had palpitations and noticed shortness of breath. Coughing up clear sputum.  Increased SOB with exertion. Denies PND. Sleeps on 2 pillows. No fever or chills. Using 2-4 liters oxygen a day. Appetite ok.  Taking all medications.   PFTs (12/15) showed a severe restrictive defect concerning for an interstitial process.   EP study 10/15 for persistent tachycardia and found to have sinus tachycardia.   06/16/14 CT scan showed bilateral progressive lower peribronchial infiltrates with groundglass changes and nodular patter consistent with acute pneumonitis of  unclear etiology.  06/20/14 BAL showed 60% macrophages and 30% PMNs. Her AFB smear, fungal smear, legionella Ab, pneumocystis smear, ACE, and sputum culture are negative. Her AFB, legionella, fungal, and BAL culture as well as her fungal Ab, hypersensitivity pneumonitis panel were all negative Serology: DsDNA, RF, ANCA, HSP all negative  ECHO  1/17 showed EF 40-45%, mild LVH, diffuse hypokinesis.  8/17 with EF improved to 50-55%, PASP 48 mmHg.  12/11/2016 EF 45-50% RV ok.   RHC 10/26/2014  RA = 2 RV = 27/1/5 PA = 29/7 (18) PCW = 4 Fick cardiac output/index = 4.5/2.2 PVR = 3.0 WU FA sat = 98% PA sat = 64%, 61%  Labs (10/15): TSH normal K 3.6, creatinine 1.1 Labs (10/31/2014) L k 3.9 Creatinine 1.06  Labs 07/23/2015: K 3.8 Creatinine 0.82  Labs (3/17): K 4, creatinine 0.6 Labs (5/17): K 4.1, creatinine 0.7 Labs (1/18): K 3.6, creatinine 0.42 Labs (10/01/2017): K 3.8 Creatinine 0.62   Review of Systems: All systems reviewed and negative except as per HPI.   Past Medical History:  Diagnosis Date  . Anxiety   . Arthritis    rheumatoid arthritis- mild, no rheumatology care   . Asthma    seasonal allergies   . Asymptomatic LV dysfunction    a. Echo in Dec 2011 with EF 35 to 40%. Felt to be due to paced rhythm. b. EF 25-30% in 07/2014.  . Bipolar affective disorder (HCC)   . Cardiac pacemaker    a. Since age 37 in 55. b. Upgrade to BiV in 2013.  [name] Carpal tunnel  syndrome of right wrist   . CHF (congestive heart failure) (HCC)   . Congenital complete AV block   . Depression    bipolar  . Diabetes mellitus without complication (HCC)   . GERD (gastroesophageal reflux disease)   . Hypertension   . Interstitial lung disease (HCC)   . Lupus   . Lupus (systemic lupus erythematosus) (HCC)   . Obesity   . Pneumonitis    a. a/w hypoxia - inflammatory - large workup 07/2014.  . Presence of permanent cardiac pacemaker   . Seizures (HCC)    as a child- from high fever  . Sinus  tachycardia     Current Outpatient Medications  Medication Sig Dispense Refill  . benzonatate (TESSALON) 200 MG capsule Take 1 capsule (200 mg total) by mouth 3 (three) times daily as needed for cough. 30 capsule 1  . bisoprolol (ZEBETA) 5 MG tablet Take 2.5 mg by mouth daily.     . calcium-vitamin D (OSCAL WITH D) 500-200 MG-UNIT TABS tablet Take 1 tablet by mouth 2 (two) times daily with a meal.  11  . dicyclomine (BENTYL) 20 MG tablet Take 20 mg by mouth every 6 (six) hours.    [name] EASY TOUCH PEN NEEDLES 31G X 8 MM MISC USE AS DIRECTED 100 each 11  . Etonogestrel (NEXPLANON Stokes) Inject into the skin.    . furosemide (LASIX) 20 MG tablet TAKE 1 TABLET BY MOUTH EVERY DAY 90 tablet 3  . LANTUS SOLOSTAR 100 UNIT/ML Solostar Pen INJECT 15 UNITS SUBCUTANEOUSLY ONCE DAILY 15 mL 3  . loratadine (CLARITIN) 10 MG tablet Take 1 tablet (10 mg total) by mouth daily. 30 tablet 11  . metFORMIN (GLUCOPHAGE) 1000 MG tablet Take 1 tablet (1,000 mg total) 2 (two) times daily with a meal by mouth. 180 tablet 3  . mycophenolate (CELLCEPT) 500 MG tablet Take 1,000 mg by mouth 2 (two) times daily.     [name] NOVOLOG FLEXPEN 100 UNIT/ML FlexPen INJECT 8 UNITS INTO THE SKIN 3 (THREE) TIMES DAILY WITH MEALS. 15 pen 5  . OXYGEN Inhale 4 L into the lungs.    . pantoprazole (PROTONIX) 40 MG tablet TAKE 1 TABLET (40MG  TOTAL) BY MOUTH DAILY 90 tablet 3  . potassium chloride SA (K-DUR,KLOR-CON) 20 MEQ tablet Take 1 tablet (20 mEq total) by mouth daily. 30 tablet 3  . ranitidine (ZANTAC) 150 MG tablet Take 150 mg by mouth 2 (two) times daily.    . tacrolimus (PROGRAF) 1 MG capsule Take 2 mg by mouth 2 (two) times daily.     . Vitamin D, Ergocalciferol, (DRISDOL) 50000 units CAPS capsule Take 50,000 Units by mouth once a week.  1   No current facility-administered medications for this encounter.     Allergies  Allergen Reactions  . Atorvastatin Other (See Comments)    Possible statin induced myopathy with elevated CK on  atrovastatin 40  . Sertraline Hcl Hives  . Tape Other (See Comments)    Burns skin      Social History   Socioeconomic History  . Marital status: Single    Spouse name: Not on file  . Number of children: 1  . Years of education: Not on file  . Highest education level: Not on file  Social Needs  . Financial resource strain: Not on file  . Food insecurity - worry: Not on file  . Food insecurity - inability: Not on file  . Transportation needs - medical: Not on file  .  Transportation needs - non-medical: Not on file  Occupational History  . Occupation: CNA  Tobacco Use  . Smoking status: Former Smoker    Packs/day: 0.25    Years: 0.50    Pack years: 0.12    Types: Cigarettes    Last attempt to quit: 07/25/1996    Years since quitting: 21.2  . Smokeless tobacco: Never Used  Substance and Sexual Activity  . Alcohol use: No  . Drug use: No  . Sexual activity: Yes    Birth control/protection: Implant  Other Topics Concern  . Not on file  Social History Narrative   Works at [employer].        Family History  Problem Relation Age of Onset  . Heart disease Mother        CHF (no details)  . Hypertension Mother   . Lupus Mother   . Heart disease Father        Murmur  . Heart disease Sister 84        No details.  History of a pacemaker    Vitals:   11/03/17 1013  BP: 138/88  Pulse: (!) 112  SpO2: (!) 89%  Weight: 154 lb 3.2 oz (69.9 kg)    PHYSICAL EXAM: \General: Appears chronically ill. No resp difficulty. Walked slowly in the clinic.  HEENT: normal Neck: supple. JVP 9-10  Carotids 2+ bilat; no bruits. No lymphadenopathy or thryomegaly appreciated. Cor: PMI nondisplaced. Regular rate & rhythm. No rubs, gallops or murmurs. Lungs: crackles in the bases.  Abdomen: soft, nontender, nondistended. No hepatosplenomegaly. No bruits or masses. Good bowel sounds. Extremities: no cyanosis, clubbing, rash, edema Neuro: alert & orientedx3, cranial nerves grossly intact.  moves all 4 extremities w/o difficulty. Affect pleasant    Optivol: well above fluid index for > 4 weeks. No A fib No VT.  ASSESSMENT & PLAN: 1) Chronic systolic HF:  Echo 05/2014 EF 20-25%, echo 1/17 improved with EF 40-45% and echo in 8/17 with EF up to 50-55%.  ECHO 11/2016  EF 45-50%. Nonischemic cardiomyopathy.  Medtronic CRT-P device.  I reviewed and discussed optivol results.  - NYHA III, limited by chronic ILD - Volume status elevated. Increase lasix to 40 mg daily x 2 days then she will continue 20 mg daily.  - Keep Bisoprolol at 2.5 mg daily.  2) Anti-synthetase syndrome with interstitial lung disease: Suspect this also causes the persistent weakness.  - Follows with Dr. Basilia Jumbo at [facility].  - Remains on 10 mg prednisone daily, tacrolimis increased. - Continues on Cellcept.   3) Congenital AVN block s/p pacer:  - Follows with Dr. Ladona Ridgel.   4) Chronic Respiratory Failure On 2-4 liters oxygen.  O2 sats 89% on 2 liters. Hopefully will improve with diuresis.   Check BMET next visit. Follow up in 4 weeks.   Amy Clegg NP-C 11/03/2017

## 2017-11-09 ENCOUNTER — Ambulatory Visit (HOSPITAL_COMMUNITY): Payer: Medicare Other | Attending: Internal Medicine

## 2017-11-09 DIAGNOSIS — J849 Interstitial pulmonary disease, unspecified: Secondary | ICD-10-CM | POA: Diagnosis not present

## 2017-11-09 DIAGNOSIS — I5032 Chronic diastolic (congestive) heart failure: Secondary | ICD-10-CM | POA: Diagnosis not present

## 2017-11-09 DIAGNOSIS — E119 Type 2 diabetes mellitus without complications: Secondary | ICD-10-CM | POA: Diagnosis not present

## 2017-11-09 DIAGNOSIS — I429 Cardiomyopathy, unspecified: Secondary | ICD-10-CM | POA: Insufficient documentation

## 2017-11-09 DIAGNOSIS — M329 Systemic lupus erythematosus, unspecified: Secondary | ICD-10-CM | POA: Diagnosis not present

## 2017-11-09 LAB — ECHOCARDIOGRAM COMPLETE
Ao-asc: 33 cm
CHL CUP PV REG GRAD DIAS: 17 mmHg
CHL CUP REG VEL DIAS: 205 cm/s
CHL CUP RV SYS PRESS: 39 mmHg
E/e' ratio: 11.59
EWDT: 153 ms
FS: 13 % — AB (ref 28–44)
IV/PV OW: 0.9
LA ID, A-P, ES: 30 mm
LA diam index: 1.66 cm/m2
LA vol A4C: 30.4 ml
LA vol: 30.7 mL
LAVOLIN: 17 mL/m2
LDCA: 3.46 cm2
LEFT ATRIUM END SYS DIAM: 30 mm
LV E/e'average: 11.59
LV PW d: 10.6 mm — AB (ref 0.6–1.1)
LV TDI E'MEDIAL: 6.96
LVEEMED: 11.59
LVELAT: 5.48 cm/s
LVOT SV: 55 mL
LVOT VTI: 15.9 cm
LVOT diameter: 21 mm
LVOTPV: 103 cm/s
Lateral S' vel: 9.36 cm/s
MV Dec: 153
MV pk A vel: 68.4 m/s
MVPKEVEL: 63.5 m/s
PISA EROA: 0.23 cm2
RV TAPSE: 19.2 mm
Reg peak vel: 301 cm/s
TDI e' lateral: 5.48
TR max vel: 301 cm/s
VTI: 172 cm

## 2017-11-19 ENCOUNTER — Telehealth (HOSPITAL_COMMUNITY): Payer: Self-pay | Admitting: *Deleted

## 2017-11-19 MED ORDER — CARVEDILOL 3.125 MG PO TABS: 3.1250 mg | ORAL_TABLET | Freq: Two times a day (BID) | ORAL | 3 refills | Status: DC

## 2017-11-19 NOTE — Telephone Encounter (Signed)
Received fax from [facility], Bisoprolol 5 mg and 10 mg tabs are on backorder and they are not able to get the medication.  Per Tonye Becket, NP ok to change pt from bisoprolol to Carvedilol 3.125 mg BID, new rx sent in

## 2017-11-23 ENCOUNTER — Other Ambulatory Visit: Payer: Self-pay | Admitting: Internal Medicine

## 2017-11-25 ENCOUNTER — Other Ambulatory Visit (HOSPITAL_COMMUNITY): Payer: Self-pay | Admitting: Cardiology

## 2017-11-26 ENCOUNTER — Encounter (HOSPITAL_COMMUNITY): Payer: Self-pay | Admitting: Emergency Medicine

## 2017-11-26 DIAGNOSIS — M791 Myalgia, unspecified site: Secondary | ICD-10-CM | POA: Insufficient documentation

## 2017-11-26 DIAGNOSIS — J849 Interstitial pulmonary disease, unspecified: Secondary | ICD-10-CM | POA: Diagnosis not present

## 2017-11-26 DIAGNOSIS — Z7952 Long term (current) use of systemic steroids: Secondary | ICD-10-CM | POA: Diagnosis not present

## 2017-11-26 DIAGNOSIS — R29898 Other symptoms and signs involving the musculoskeletal system: Secondary | ICD-10-CM | POA: Diagnosis not present

## 2017-11-26 DIAGNOSIS — Z5321 Procedure and treatment not carried out due to patient leaving prior to being seen by health care provider: Secondary | ICD-10-CM | POA: Diagnosis not present

## 2017-11-26 DIAGNOSIS — R05 Cough: Secondary | ICD-10-CM | POA: Diagnosis not present

## 2017-11-26 DIAGNOSIS — R070 Pain in throat: Secondary | ICD-10-CM | POA: Insufficient documentation

## 2017-11-26 DIAGNOSIS — Z87891 Personal history of nicotine dependence: Secondary | ICD-10-CM | POA: Diagnosis not present

## 2017-11-26 DIAGNOSIS — R7989 Other specified abnormal findings of blood chemistry: Secondary | ICD-10-CM | POA: Diagnosis not present

## 2017-11-26 DIAGNOSIS — R0902 Hypoxemia: Secondary | ICD-10-CM | POA: Diagnosis not present

## 2017-11-26 DIAGNOSIS — Z79899 Other long term (current) drug therapy: Secondary | ICD-10-CM | POA: Diagnosis not present

## 2017-11-26 DIAGNOSIS — M3320 Polymyositis, organ involvement unspecified: Secondary | ICD-10-CM | POA: Diagnosis not present

## 2017-11-26 DIAGNOSIS — D8989 Other specified disorders involving the immune mechanism, not elsewhere classified: Secondary | ICD-10-CM | POA: Diagnosis not present

## 2017-11-26 DIAGNOSIS — R0981 Nasal congestion: Secondary | ICD-10-CM | POA: Diagnosis not present

## 2017-11-26 DIAGNOSIS — B349 Viral infection, unspecified: Secondary | ICD-10-CM | POA: Diagnosis not present

## 2017-11-26 NOTE — ED Triage Notes (Signed)
Pt states she has had congestion, cough, sore throat, and body aches x 3 days. Pt was seen at her PCP and sent here due to her low O2 saturation of 88% on her home O2. She normally wears 2L [place] at home for interstitial lung disease but has been using 4L when she has episodes of coughing. Denies fevers but states she has had chills at home.

## 2017-11-27 ENCOUNTER — Emergency Department (HOSPITAL_COMMUNITY)
Admission: EM | Admit: 2017-11-27 | Discharge: 2017-11-27 | Disposition: A | Discharge status: Home / Self Care | Payer: Medicare Other | Attending: Emergency Medicine | Admitting: Emergency Medicine

## 2017-11-27 ENCOUNTER — Ambulatory Visit (HOSPITAL_COMMUNITY)
Admission: EM | Admit: 2017-11-27 | Discharge: 2017-11-27 | Disposition: A | Discharge status: Nursing Home (Includes ICF) | Payer: Medicare Other | Source: Home / Self Care

## 2017-11-27 NOTE — ED Notes (Signed)
Called in lobby to trreatment room no answer

## 2017-11-27 NOTE — ED Notes (Signed)
Patient deferred to the [facility] ED per Alysia Penna, PA

## 2017-11-27 NOTE — ED Notes (Signed)
Patient came to nurse first asking about wait time, she stated that her son was seen in Peds and they told her when she came back to the waiting room she wouldn't have to wait that long because she was an acuity 2. I told her I wasn't sure why they would tell her that because they can see how long our wait time is. I explained to her there were still several acuity 2's that had been here longer than her how you get moved to the back is based on several things not just wait time or acuity

## 2017-12-01 ENCOUNTER — Emergency Department (HOSPITAL_COMMUNITY): Payer: Medicare Other

## 2017-12-01 ENCOUNTER — Emergency Department (HOSPITAL_COMMUNITY)
Admission: EM | Admit: 2017-12-01 | Discharge: 2017-12-01 | Disposition: A | Discharge status: Home / Self Care | Payer: Medicare Other | Attending: Emergency Medicine | Admitting: Emergency Medicine

## 2017-12-01 ENCOUNTER — Encounter (HOSPITAL_COMMUNITY): Payer: Self-pay | Admitting: *Deleted

## 2017-12-01 DIAGNOSIS — Z79899 Other long term (current) drug therapy: Secondary | ICD-10-CM | POA: Diagnosis not present

## 2017-12-01 DIAGNOSIS — Z87891 Personal history of nicotine dependence: Secondary | ICD-10-CM | POA: Diagnosis not present

## 2017-12-01 DIAGNOSIS — E119 Type 2 diabetes mellitus without complications: Secondary | ICD-10-CM | POA: Insufficient documentation

## 2017-12-01 DIAGNOSIS — Z7984 Long term (current) use of oral hypoglycemic drugs: Secondary | ICD-10-CM | POA: Insufficient documentation

## 2017-12-01 DIAGNOSIS — R0602 Shortness of breath: Secondary | ICD-10-CM | POA: Diagnosis not present

## 2017-12-01 DIAGNOSIS — R079 Chest pain, unspecified: Secondary | ICD-10-CM | POA: Diagnosis not present

## 2017-12-01 DIAGNOSIS — J181 Lobar pneumonia, unspecified organism: Secondary | ICD-10-CM | POA: Diagnosis not present

## 2017-12-01 DIAGNOSIS — J189 Pneumonia, unspecified organism: Secondary | ICD-10-CM | POA: Diagnosis not present

## 2017-12-01 DIAGNOSIS — I5042 Chronic combined systolic (congestive) and diastolic (congestive) heart failure: Secondary | ICD-10-CM | POA: Diagnosis not present

## 2017-12-01 DIAGNOSIS — R197 Diarrhea, unspecified: Secondary | ICD-10-CM | POA: Diagnosis not present

## 2017-12-01 DIAGNOSIS — I110 Hypertensive heart disease with heart failure: Secondary | ICD-10-CM | POA: Diagnosis not present

## 2017-12-01 DIAGNOSIS — R0981 Nasal congestion: Secondary | ICD-10-CM | POA: Diagnosis not present

## 2017-12-01 LAB — I-STAT TROPONIN, ED: TROPONIN I, POC: 0.01 ng/mL (ref 0.00–0.08)

## 2017-12-01 LAB — COMPREHENSIVE METABOLIC PANEL
ALBUMIN: 2.9 g/dL — AB (ref 3.5–5.0)
ALT: 20 U/L (ref 14–54)
AST: 28 U/L (ref 15–41)
Alkaline Phosphatase: 55 U/L (ref 38–126)
Anion gap: 12 (ref 5–15)
BUN: 7 mg/dL (ref 6–20)
CHLORIDE: 101 mmol/L (ref 101–111)
CO2: 24 mmol/L (ref 22–32)
CREATININE: 0.67 mg/dL (ref 0.44–1.00)
Calcium: 8.6 mg/dL — ABNORMAL LOW (ref 8.9–10.3)
GFR calc Af Amer: >60 mL/min (ref >60)
GFR calc non Af Amer: >60 mL/min (ref >60)
GLUCOSE: 133 mg/dL — AB (ref 65–99)
POTASSIUM: 3.6 mmol/L (ref 3.5–5.1)
Sodium: 137 mmol/L (ref 135–145)
Total Bilirubin: 0.7 mg/dL (ref 0.3–1.2)
Total Protein: 6.6 g/dL (ref 6.5–8.1)

## 2017-12-01 LAB — CBC WITH DIFFERENTIAL/PLATELET
Basophils Absolute: 0 10*3/uL (ref 0.0–0.1)
Basophils Relative: 0 %
Eosinophils Absolute: 0.3 10*3/uL (ref 0.0–0.7)
Eosinophils Relative: 4 %
HCT: 37.2 % (ref 36.0–46.0)
HEMOGLOBIN: 11 g/dL — AB (ref 12.0–15.0)
LYMPHS ABS: 1.3 10*3/uL (ref 0.7–4.0)
LYMPHS PCT: 18 %
MCH: 24.7 pg — AB (ref 26.0–34.0)
MCHC: 29.6 g/dL — ABNORMAL LOW (ref 30.0–36.0)
MCV: 83.6 fL (ref 78.0–100.0)
MONOS PCT: 7 %
Monocytes Absolute: 0.5 10*3/uL (ref 0.1–1.0)
NEUTROS PCT: 71 %
Neutro Abs: 5 10*3/uL (ref 1.7–7.7)
Platelets: 310 10*3/uL (ref 150–400)
RBC: 4.45 MIL/uL (ref 3.87–5.11)
RDW: 15.6 % — ABNORMAL HIGH (ref 11.5–15.5)
WBC: 7 10*3/uL (ref 4.0–10.5)

## 2017-12-01 LAB — URINALYSIS, ROUTINE W REFLEX MICROSCOPIC
Bilirubin Urine: NEGATIVE
GLUCOSE, UA: NEGATIVE mg/dL
Ketones, ur: NEGATIVE mg/dL
Nitrite: NEGATIVE
PROTEIN: NEGATIVE mg/dL
Specific Gravity, Urine: 1.019 (ref 1.005–1.030)
pH: 5 (ref 5.0–8.0)

## 2017-12-01 LAB — BRAIN NATRIURETIC PEPTIDE: B Natriuretic Peptide: 27.4 pg/mL (ref 0.0–100.0)

## 2017-12-01 LAB — INFLUENZA PANEL BY PCR (TYPE A & B)
INFLAPCR: NEGATIVE
INFLBPCR: NEGATIVE

## 2017-12-01 LAB — I-STAT BETA HCG BLOOD, ED (MC, WL, AP ONLY): I-stat hCG, quantitative: <5 m[IU]/mL (ref <5)

## 2017-12-01 MED ORDER — SODIUM CHLORIDE 0.9 % IV SOLN
1.0000 g | Freq: Once | INTRAVENOUS | Status: AC
  Administered 2017-12-01: 1 g via INTRAVENOUS
  Filled 2017-12-01: qty 10

## 2017-12-01 MED ORDER — IOPAMIDOL (ISOVUE-370) INJECTION 76%
INTRAVENOUS | Status: AC
  Administered 2017-12-01: 80 mL
  Filled 2017-12-01: qty 100

## 2017-12-01 MED ORDER — AMOXICILLIN-POT CLAVULANATE 875-125 MG PO TABS: 1.0000 | ORAL_TABLET | Freq: Two times a day (BID) | ORAL | 0 refills | Status: DC

## 2017-12-01 MED ORDER — DOXYCYCLINE HYCLATE 100 MG PO CAPS: 100.0000 mg | ORAL_CAPSULE | Freq: Two times a day (BID) | ORAL | 0 refills | Status: AC

## 2017-12-01 MED ORDER — BENZONATATE 100 MG PO CAPS: 100.0000 mg | ORAL_CAPSULE | Freq: Three times a day (TID) | ORAL | 0 refills | Status: DC

## 2017-12-01 NOTE — ED Notes (Signed)
Pt reports being on [place] 2L at home by MD. Pt has taken self to 4 L [place] r/t coughing x1 week. Pt placed on 4 L  in room.

## 2017-12-01 NOTE — ED Notes (Addendum)
Pt reports cough, aches, N, V, and D for about a week. Droplet precautions.

## 2017-12-01 NOTE — ED Notes (Signed)
ED Provider at bedside. 

## 2017-12-01 NOTE — ED Provider Notes (Signed)
[facility] EMERGENCY DEPARTMENT Provider Note   CSN: [id number] Arrival date & time: 12/01/17  1735     History   Chief Complaint Chief Complaint  Patient presents with  . Shortness of Breath    HPI Makayla Vasquez is a 37 y.o. female.  HPI   Makayla Vasquez is a 37 y.o. female, with a history of interstitial lung disease, CHF, lupus, DM, chronic supplemental O2, presenting to the ED with shortness of breath for the last week. Accompanied by nonproductive cough, chills, nausea, vomiting, diarrhea, nasal congestion, and body aches.    Denies fever, chest pain, abdominal pain, rash, neck pain/stiffness, sore throat, or any other complaints.    Past Medical History:  Diagnosis Date  . Anxiety   . Arthritis    rheumatoid arthritis- mild, no rheumatology care   . Asthma    seasonal allergies   . Asymptomatic LV dysfunction    a. Echo in Dec 2011 with EF 35 to 40%. Felt to be due to paced rhythm. b. EF 25-30% in 07/2014.  . Bipolar affective disorder (HCC)   . Cardiac pacemaker    a. Since age 53 in 66. b. Upgrade to BiV in 2013.  [name] Carpal tunnel syndrome of right wrist   . CHF (congestive heart failure) (HCC)   . Congenital complete AV block   . Depression    bipolar  . Diabetes mellitus without complication (HCC)   . GERD (gastroesophageal reflux disease)   . Hypertension   . Interstitial lung disease (HCC)   . Lupus   . Lupus (systemic lupus erythematosus) (HCC)   . Obesity   . Pneumonitis    a. a/w hypoxia - inflammatory - large workup 07/2014.  . Presence of permanent cardiac pacemaker   . Seizures (HCC)    as a child- from high fever  . Sinus tachycardia     Patient Active Problem List   Diagnosis Date Noted  . Concern about breast cancer in female without diagnosis 08/25/2017  . Acute upper respiratory infection 08/20/2017  . Dyspareunia, female 05/14/2017  . 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 On Cellcept therapy   . On prednisone  therapy   . Systemic lupus erythematosus (HCC)   . Long term (current) use of systemic steroids   . Chronic diastolic congestive heart failure (HCC)   . Cough   . Anemia of chronic disease   . Bipolar affective disorder in remission (HCC)   . Cardiac pacemaker in situ   . Adrenal insufficiency (HCC)   . Antisynthetase syndrome (HCC)   . Idiopathic hypotension 07/08/2015  . Hyperlipidemia associated with type 2 diabetes mellitus (HCC) 05/04/2015  . Diabetes mellitus type 2 in obese (HCC)   . Chronic respiratory failure with hypoxia (HCC) 08/07/2014  . Insertion of implantable subdermal contraceptive 07/12/2014  . ILD (interstitial lung disease) (HCC) 06/19/2014  . Congestive dilated cardiomyopathy (HCC) 05/31/2014  . Chronic systolic heart failure (HCC) 02/26/2012  . PPM-Medtronic   . Congenital complete AV block   . GERD (gastroesophageal reflux disease)   . THYROGLOSSAL DUCT CYST 07/18/2010  . POLYCYSTIC OVARY 12/17/2006    Past Surgical History:  Procedure Laterality Date  . ATRIAL TACH ABLATION N/A 08/14/2014   Procedure: ATRIAL TACH ABLATION;  Surgeon: Marinus Maw, MD;  Location: [facility] CATH LAB;  Service: Cardiovascular;  Laterality: N/A;  . BI-VENTRICULAR PACEMAKER UPGRADE N/A 03/08/2012   Procedure: BI-VENTRICULAR PACEMAKER UPGRADE;  Surgeon: Marinus Maw, MD;  Location: [facility]  CATH LAB;  Service: Cardiovascular;  Laterality: N/A;  . CARPAL TUNNEL WITH CUBITAL TUNNEL Right 07/26/2013   Procedure: RIGHT LIMITED OPEN CARPAL TUNNEL RELEASE ,  RIGHT CUBITAL TUNNEL RELEASE, INSITU VERSES ULNAR NERVE DECOMPRESSION AND ANTERIOR TRANSPOSITION;  Surgeon: Dominica Severin, MD;  Location: [facility] OR;  Service: Orthopedics;  Laterality: Right;  . CESAREAN SECTION    . CHOLECYSTECTOMY    . CYST EXCISION  12/10/2012   THYROID  . INSERT / REPLACE / REMOVE PACEMAKER     2001  . IUD REMOVAL  11/03/2011   Procedure: INTRAUTERINE DEVICE (IUD) REMOVAL;  Surgeon: Myra C. Marice Potter, MD;  Location: [facility] ORS;   Service: Gynecology;  Laterality: N/A;  . NASAL FRACTURE SURGERY     /w plate   . RIGHT HEART CATHETERIZATION N/A 10/26/2014   Procedure: RIGHT HEART CATH;  Surgeon: Dolores Patty, MD;  Location: [facility] CATH LAB;  Service: Cardiovascular;  Laterality: N/A;  . THROAT SURGERY  1994   s/p laser treatment  . THYROGLOSSAL DUCT CYST N/A 12/10/2012   Procedure: REVISION OF THYROGLOSSAL DUCT CYST EXCISION;  Surgeon: Serena Colonel, MD;  Location: [facility] OR;  Service: ENT;  Laterality: N/A;  Revision of Thyroglossal Duct Cyst Excision  . VIDEO BRONCHOSCOPY Bilateral 06/19/2014   Procedure: VIDEO BRONCHOSCOPY WITHOUT FLUORO;  Surgeon: Kalman Shan, MD;  Location: [facility] ENDOSCOPY;  Service: Cardiopulmonary;  Laterality: Bilateral;    OB History    Gravida Para Term Preterm AB Living   1 1 1     1    SAB TAB Ectopic Multiple Live Births                   Home Medications    Prior to Admission medications   Medication Sig Start Date End Date Taking? Authorizing Provider  amoxicillin-clavulanate (AUGMENTIN) 875-125 MG tablet Take 1 tablet by mouth every 12 (twelve) hours. 12/01/17   Hugh Kamara C, PA-C  benzonatate (TESSALON) 100 MG capsule Take 1 capsule (100 mg total) by mouth every 8 (eight) hours. 12/01/17   Ediberto Sens C, PA-C  benzonatate (TESSALON) 200 MG capsule Take 1 capsule (200 mg total) by mouth 3 (three) times daily as needed for cough. 09/22/17   Marquette Saa, MD  calcium-vitamin D (OSCAL WITH D) 500-200 MG-UNIT TABS tablet Take 1 tablet by mouth 2 (two) times daily with a meal. 09/03/17   [provider]  carvedilol (COREG) 3.125 MG tablet Take 1 tablet (3.125 mg total) by mouth 2 (two) times daily. 11/19/17 02/17/18  Clegg, Amy D, NP  dicyclomine (BENTYL) 20 MG tablet Take 20 mg by mouth every 6 (six) hours.    [provider]  doxycycline (VIBRAMYCIN) 100 MG capsule Take 1 capsule (100 mg total) by mouth 2 (two) times daily for 7 days. 12/01/17 12/08/17  Lizmarie Witters, Hillard Danker,  PA-C  EASY TOUCH PEN NEEDLES 31G X 8 MM MISC USE AS DIRECTED 08/14/17   Marquette Saa, MD  Etonogestrel [facility]) Inject into the skin.    [provider]  furosemide (LASIX) 20 MG tablet Take 1 tablet (20 mg total) by mouth daily. 11/03/17   Tonye Becket D, NP  KLOR-CON M20 20 MEQ tablet TAKE 1 TABLET BY MOUTH EVERY DAY 11/26/17   Bensimhon, Bevelyn Buckles, MD  LANTUS SOLOSTAR 100 UNIT/ML Solostar Pen INJECT 15 UNITS SUBCUTANEOUSLY ONCE DAILY 05/15/17   Marquette Saa, MD  loratadine (CLARITIN) 10 MG tablet Take 1 tablet (10 mg total) by mouth daily. 06/26/17  Arvilla Market, DO  metFORMIN (GLUCOPHAGE) 1000 MG tablet Take 1 tablet (1,000 mg total) 2 (two) times daily with a meal by mouth. 08/24/17   Marquette Saa, MD  mycophenolate (CELLCEPT) 500 MG tablet Take 1,000 mg by mouth 2 (two) times daily.     [provider]  NOVOLOG FLEXPEN 100 UNIT/ML FlexPen INJECT 8 UNITS INTO THE SKIN 3 (THREE) TIMES DAILY WITH MEALS. 09/30/17   Marquette Saa, MD  OXYGEN Inhale 4 L into the lungs.    [provider]  pantoprazole (PROTONIX) 40 MG tablet TAKE 1 TABLET (40MG  TOTAL) BY MOUTH DAILY 05/15/17   05/17/17, MD  ranitidine (ZANTAC) 150 MG tablet Take 150 mg by mouth 2 (two) times daily.    [provider]  tacrolimus (PROGRAF) 1 MG capsule Take 2 mg by mouth 2 (two) times daily.  01/26/17   Ang, 03/28/17, MD  Vitamin D, Ergocalciferol, (DRISDOL) 50000 units CAPS capsule Take 50,000 Units by mouth once a week. 09/27/16   [provider]    Family History Family History  Problem Relation Age of Onset  . Heart disease Mother        CHF (no details)  . Hypertension Mother   . Lupus Mother   . Heart disease Father        Murmur  . Heart disease Sister 68        No details.  History of a pacemaker    Social History Social History   Tobacco Use  . Smoking status: Former Smoker    Packs/day:  0.25    Years: 0.50    Pack years: 0.12    Types: Cigarettes    Last attempt to quit: 07/25/1996    Years since quitting: 21.3  . Smokeless tobacco: Never Used  Substance Use Topics  . Alcohol use: No  . Drug use: No     Allergies   Atorvastatin; Sertraline hcl; and Tape   Review of Systems Review of Systems  Constitutional: Positive for chills. Negative for diaphoresis and fever.  HENT: Positive for congestion. Negative for sore throat and trouble swallowing.   Respiratory: Positive for cough and shortness of breath.   Cardiovascular: Negative for chest pain, palpitations and leg swelling.  Gastrointestinal: Positive for diarrhea, nausea and vomiting. Negative for abdominal pain.  Genitourinary: Negative for decreased urine volume.  Musculoskeletal: Negative for neck pain and neck stiffness.  Skin: Negative for rash.  Neurological: Negative for dizziness, syncope, light-headedness and headaches.  All other systems reviewed and are negative.    Physical Exam Updated Vital Signs BP 135/78 (BP Location: Right Arm)   Temp 98.1 F (36.7 C) (Oral)   Resp 16   SpO2 94%   Physical Exam  Constitutional: She appears well-developed and well-nourished. No distress.  HENT:  Head: Normocephalic and atraumatic.  Eyes: Conjunctivae are normal.  Neck: Neck supple.  Cardiovascular: Normal rate, regular rhythm, normal heart sounds and intact distal pulses.  Pulmonary/Chest: Effort normal and breath sounds normal. No respiratory distress.  No increased work of breathing.  Patient speaks in full sentences without difficulty.  Abdominal: Soft. There is no tenderness. There is no guarding.  Musculoskeletal: She exhibits no edema.  Lymphadenopathy:    She has no cervical adenopathy.  Neurological: She is alert.  Skin: Skin is warm and dry. She is not diaphoretic.  Psychiatric: She has a normal mood and affect. Her behavior is normal.  Nursing note and vitals reviewed.  ED  Treatments / Results  Labs (all labs ordered are listed, but only abnormal results are displayed) Labs Reviewed  COMPREHENSIVE METABOLIC PANEL - Abnormal; Notable for the following components:      Result Value   Glucose, Bld 133 (*)    Calcium 8.6 (*)    Albumin 2.9 (*)    All other components within normal limits  CBC WITH DIFFERENTIAL/PLATELET - Abnormal; Notable for the following components:   Hemoglobin 11.0 (*)    MCH 24.7 (*)    MCHC 29.6 (*)    RDW 15.6 (*)    All other components within normal limits  URINALYSIS, ROUTINE W REFLEX MICROSCOPIC - Abnormal; Notable for the following components:   Hgb urine dipstick MODERATE (*)    Leukocytes, UA TRACE (*)    Bacteria, UA FEW (*)    Squamous Epithelial / LPF 0-5 (*)    All other components within normal limits  BRAIN NATRIURETIC PEPTIDE  INFLUENZA PANEL BY PCR (TYPE A & B)  TACROLIMUS LEVEL  MYCOPHENOLIC ACID (CELLCEPT)  I-STAT TROPONIN, ED  I-STAT BETA HCG BLOOD, ED ([facility], [facility], [facility] ONLY)    EKG  EKG Interpretation  Date/Time:  Tuesday December 01 2017 08:23:26 EST Ventricular Rate:  119 PR Interval:    QRS Duration: 126 QT Interval:  370 QTC Calculation: 520 R Axis:   -124 Text Interpretation:  Ventricular-paced rhythm Abnormal ECG No significant change since last tracing Confirmed by Drema Pry [phone]) on 12/01/2017 8:28:36 AM       Radiology Dg Chest 2 View  Result Date: 12/01/2017 CLINICAL DATA:  Shortness of breath and chest pain EXAM: CHEST  2 VIEW COMPARISON:  10/01/2017 FINDINGS: Cardiac shadow is stable. Pacing device is again noted. Patchy fibrotic changes are again identified bilaterally stable from the previous study. No new infiltrate or effusion is seen. No bony abnormality is noted. IMPRESSION: Chronic changes without acute abnormality. Electronically Signed   By: Alcide Clever M.D.   On: 12/01/2017 11:09   Ct Angio Chest Pe W And/or Wo Contrast  Result Date: 12/01/2017 CLINICAL DATA:  Shortness of  Breath EXAM: CT ANGIOGRAPHY CHEST WITH CONTRAST TECHNIQUE: Multidetector CT imaging of the chest was performed using the standard protocol during bolus administration of intravenous contrast. Multiplanar CT image reconstructions and MIPs were obtained to evaluate the vascular anatomy. CONTRAST:  80 mL ISOVUE-370 IOPAMIDOL (ISOVUE-370) INJECTION 76% COMPARISON:  Chest CT January 05, 2017 and chest radiograph December 01, 2017 and Feb 22, 2016 FINDINGS: Cardiovascular: There is no demonstrable pulmonary embolus. There is no appreciable thoracic aortic aneurysm or dissection. Pericardium is not appreciably thickened. There is no pericardial effusion. There is prominence of the main pulmonary outflow tract measuring 3.1 cm, felt to represent a degree of pulmonary arterial hypertension. Pacemaker leads are attached the right atrium, right ventricle, and coronary sinus. Mediastinum/Nodes: Visualized thyroid appears unremarkable. There is a right paratracheal lymph node measuring 1.4 x 1.3 cm. There is a right hilar lymph node measuring 1.3 x 1.3 cm. There is an aortopulmonary window lymph node measuring 1.7 x 1.3 cm. There is a left hilar measuring 1.2 x 1.3 cm. There is a subcarinal lymph node measuring 1.3 x 1.2 cm. No esophageal lesions are evident. Lungs/Pleura: As was noted previously, there is diffuse reticulonodular interstitial disease throughout the lungs diffusely with multiple ground-glass type opacities, primarily in the lingula and lower lobes bilaterally. There is more confluent opacity in the posterior segment of the right upper lobe. A focal nodular opacity  in the posterior segment of the left lower lobe measuring 6 x 6 mm is again noted and is stable, seen on axial slice 91 series 8. There is no appreciable pleural effusion. Upper Abdomen: There is reflux of contrast into the inferior vena cava and hepatic veins. Visualized upper abdominal structures otherwise appear unremarkable. Musculoskeletal: No blastic  or lytic bone lesions are evident. Review of the MIP images confirms the above findings. IMPRESSION: 1. No demonstrable pulmonary embolus. No thoracic aortic aneurysm or dissection. 2. There remains widespread reticulonodular interstitial disease with areas of ground-glass opacity, primarily in the lower lobes and lingular regions. These changes are similar compared to prior studies. Differential considerations include chronic post infectious/postinflammatory scarring, chronic allergic type reaction, a degree of chronic congestive heart failure with associated chronic inflammatory type change in the lungs, and sarcoidosis. 3. Stable 6 mm nodular opacity left lower lobe. This lesion has remained stable since 2017 is felt to have benign etiology. 4. Focal airspace consolidation posterior segment right upper lobe. Acute focus of pneumonia questioned. 5. Areas of adenopathy which could well have reactive etiology given the parenchymal lung changes. Adenopathy secondary to sarcoidosis is a consideration. 6. Prominence of the main pulmonary outflow tract, felt to represent a degree of pulmonary arterial hypertension. 7. Reflux of contrast into the inferior vena cava and hepatic veins, a finding felt to be indicative of increase in right heart pressure. Electronically Signed   By: Bretta Bang III M.D.   On: 12/01/2017 12:24    Procedures Procedures (including critical care time)  Medications Ordered in ED Medications  iopamidol (ISOVUE-370) 76 % injection (80 mLs  Contrast Given 12/01/17 1150)  cefTRIAXone (ROCEPHIN) 1 g in sodium chloride 0.9 % 100 mL IVPB (0 g Intravenous Stopped 12/01/17 1437)     Initial Impression / Assessment and Plan / ED Course  I have reviewed the triage vital signs and the nursing notes.  Pertinent labs & imaging results that were available during my care of the patient were reviewed by me and considered in my medical decision making (see chart for details).  Clinical Course  as of Dec 01 1516  Tue Dec 01, 2017  1336 Discussed imaging and lab results with patient. Patient states she "feels good." No longer feels short of breath.   [SJ]  1345 Spoke with Christiane Ha, pharamacist, for assistance with patient's CAP regimen in light of her QT prolongation.   [SJ]  1400 Christiane Ha recommends combination therapy of Augmentin and doxycycline for 7 days due to the patient's comorbidities.  [SJ]    Clinical Course User Index [SJ] Amauri Keefe C, PA-C    Patient presents with complaint of shortness of breath and cough. Patient is nontoxic appearing, afebrile, not tachycardic, not tachypneic, not hypotensive, maintains excellent SPO2 on home O2, and is in no apparent distress.  Evidence of right lung consolidation, gives suspicion for CAP. Influenza negative. Patient ambulated on her home 2 L/min supplemental O2 without increased work of breathing or vital sign abnormality. Tolerating p.o. No instances of vomiting or diarrhea here in the ED.  Patient's QT prolongation was taken into account when deciding her medication regimen. Prograf and Cellcept lab levels pending at discharge.  The patient was given instructions for home care as well as return precautions. Patient voices understanding of these instructions, accepts the plan, and is comfortable with discharge.  Findings and plan of care discussed with Drema Pry, MD. Dr. Eudelia Bunch personally evaluated and examined this patient.   Vitals:  12/01/17 1315 12/01/17 1400 12/01/17 1500 12/01/17 1520  BP: 108/71 (!) 117/93  99/65  Pulse: 63 64 63 65  Resp: (!) 24 (!) 21 18 17   Temp:      TempSrc:      SpO2: 100% 98% 98% 98%     Final Clinical Impressions(s) / ED Diagnoses   Final diagnoses:  SOB (shortness of breath)  Community acquired pneumonia of right upper lobe of lung [facility])    ED Discharge Orders        Ordered    amoxicillin-clavulanate (AUGMENTIN) 875-125 MG tablet  Every 12 hours     12/01/17 1504     doxycycline (VIBRAMYCIN) 100 MG capsule  2 times daily     12/01/17 1504    benzonatate (TESSALON) 100 MG capsule  Every 8 hours     12/01/17 1504       Anselm Pancoast, PA-C 12/01/17 1538    Nira Conn, MD 12/05/17 1821

## 2017-12-01 NOTE — ED Triage Notes (Signed)
Pt reports congestion and cough to the point that she vomits. Pt reports nasal congestion as well. Pt states that this has been ongoing for 1 week. Pt reports using 2L of O2 but increase O2 when she has "coughing spells" pt was seen recently for same

## 2017-12-01 NOTE — Discharge Instructions (Signed)
There is evidence of possible pneumonia on the CT scan. Please take all of your antibiotics until finished!   You may develop abdominal discomfort or diarrhea from the antibiotic.  You may help offset this with probiotics which you can buy or get in yogurt. Do not eat or take the probiotics until 2 hours after your antibiotic.  Follow up with your primary care provider on this matter.  Return to the ED should symptoms worsen.

## 2017-12-02 ENCOUNTER — Encounter: Payer: Self-pay | Admitting: Cardiology

## 2017-12-02 ENCOUNTER — Encounter (HOSPITAL_COMMUNITY): Payer: Self-pay

## 2017-12-02 ENCOUNTER — Encounter: Payer: Self-pay | Admitting: Adult Health

## 2017-12-02 ENCOUNTER — Ambulatory Visit (HOSPITAL_COMMUNITY)
Admission: RE | Admit: 2017-12-02 | Discharge: 2017-12-02 | Disposition: A | Discharge status: Home / Self Care | Payer: Medicare Other | Source: Ambulatory Visit | Attending: Cardiology | Admitting: Cardiology

## 2017-12-02 ENCOUNTER — Ambulatory Visit (INDEPENDENT_AMBULATORY_CARE_PROVIDER_SITE_OTHER): Payer: Medicare Other | Admitting: Adult Health

## 2017-12-02 VITALS — BP 100/62 | HR 90 | Wt 157.0 lb

## 2017-12-02 DIAGNOSIS — Z9981 Dependence on supplemental oxygen: Secondary | ICD-10-CM | POA: Insufficient documentation

## 2017-12-02 DIAGNOSIS — Z794 Long term (current) use of insulin: Secondary | ICD-10-CM | POA: Insufficient documentation

## 2017-12-02 DIAGNOSIS — I5022 Chronic systolic (congestive) heart failure: Secondary | ICD-10-CM | POA: Insufficient documentation

## 2017-12-02 DIAGNOSIS — Z87891 Personal history of nicotine dependence: Secondary | ICD-10-CM | POA: Diagnosis not present

## 2017-12-02 DIAGNOSIS — Z8249 Family history of ischemic heart disease and other diseases of the circulatory system: Secondary | ICD-10-CM | POA: Diagnosis not present

## 2017-12-02 DIAGNOSIS — J9610 Chronic respiratory failure, unspecified whether with hypoxia or hypercapnia: Secondary | ICD-10-CM | POA: Insufficient documentation

## 2017-12-02 DIAGNOSIS — I110 Hypertensive heart disease with heart failure: Secondary | ICD-10-CM | POA: Diagnosis not present

## 2017-12-02 DIAGNOSIS — Z95810 Presence of automatic (implantable) cardiac defibrillator: Secondary | ICD-10-CM | POA: Insufficient documentation

## 2017-12-02 DIAGNOSIS — I5032 Chronic diastolic (congestive) heart failure: Secondary | ICD-10-CM | POA: Diagnosis not present

## 2017-12-02 DIAGNOSIS — J9611 Chronic respiratory failure with hypoxia: Secondary | ICD-10-CM

## 2017-12-02 DIAGNOSIS — M1990 Unspecified osteoarthritis, unspecified site: Secondary | ICD-10-CM | POA: Insufficient documentation

## 2017-12-02 DIAGNOSIS — J181 Lobar pneumonia, unspecified organism: Secondary | ICD-10-CM

## 2017-12-02 DIAGNOSIS — Z888 Allergy status to other drugs, medicaments and biological substances status: Secondary | ICD-10-CM | POA: Insufficient documentation

## 2017-12-02 DIAGNOSIS — I429 Cardiomyopathy, unspecified: Secondary | ICD-10-CM | POA: Diagnosis not present

## 2017-12-02 DIAGNOSIS — Z79899 Other long term (current) drug therapy: Secondary | ICD-10-CM | POA: Insufficient documentation

## 2017-12-02 DIAGNOSIS — Z8269 Family history of other diseases of the musculoskeletal system and connective tissue: Secondary | ICD-10-CM | POA: Diagnosis not present

## 2017-12-02 DIAGNOSIS — E119 Type 2 diabetes mellitus without complications: Secondary | ICD-10-CM | POA: Insufficient documentation

## 2017-12-02 DIAGNOSIS — J189 Pneumonia, unspecified organism: Secondary | ICD-10-CM

## 2017-12-02 DIAGNOSIS — K219 Gastro-esophageal reflux disease without esophagitis: Secondary | ICD-10-CM | POA: Diagnosis not present

## 2017-12-02 DIAGNOSIS — M069 Rheumatoid arthritis, unspecified: Secondary | ICD-10-CM | POA: Insufficient documentation

## 2017-12-02 DIAGNOSIS — G5601 Carpal tunnel syndrome, right upper limb: Secondary | ICD-10-CM | POA: Diagnosis not present

## 2017-12-02 DIAGNOSIS — J45909 Unspecified asthma, uncomplicated: Secondary | ICD-10-CM | POA: Diagnosis not present

## 2017-12-02 DIAGNOSIS — M329 Systemic lupus erythematosus, unspecified: Secondary | ICD-10-CM | POA: Insufficient documentation

## 2017-12-02 DIAGNOSIS — Z91048 Other nonmedicinal substance allergy status: Secondary | ICD-10-CM | POA: Diagnosis not present

## 2017-12-02 DIAGNOSIS — E669 Obesity, unspecified: Secondary | ICD-10-CM | POA: Diagnosis not present

## 2017-12-02 DIAGNOSIS — J849 Interstitial pulmonary disease, unspecified: Secondary | ICD-10-CM | POA: Diagnosis not present

## 2017-12-02 DIAGNOSIS — Z9889 Other specified postprocedural states: Secondary | ICD-10-CM | POA: Diagnosis not present

## 2017-12-02 NOTE — Progress Notes (Signed)
@Patient  ID: Makayla Vasquez, female    DOB: [birth year], 37 y.o.   MRN: [id number]  Chief Complaint  Patient presents with  . 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   TEST  CT chest 12/2016 >IMPRESSION: 1. Pulmonary parenchymal pattern of reticulonodular coarsening and basilar predominant ground-glass is stable from 12/10/2015 , 6 mm LLL nodule  Allergy profile 02/23/2014-negative with total IgE 22.9 HSP panel was neg.  Positive ANA, a speckled positive ANA 1-80 pattern, mildly elevated ESR, non-reactive HIV, and negative PPD EF (25-30%, from baseline 30-25% in 2013). 2015- CXR-bilateral progressive lower lobe peribronchial infiltrates with groundglass changes and nodular pattern consistent with acute pneumonitis of unclear etiology Walk test on room air: 44/22/16: 98%, 92%, 91%, 90% at rest on room air. CT chest 06/28/2015-Severe bilaterally symmetric ground-glass consolidation.  12/02/2017 Follow up ; ILD , O2 RF and PNA  Patient returns for a  50-monthh follow-up.  She was last seen in May 2018. She was seen in the emergency room yesterday and found to have a right upper lobe pneumonia CT chest was negative for PE.  Noted chronic interstitial lung disease..  New right upper lobe focal airspace consolidation.  Patient was started on doxycycline and Augmentin.  Patient says she feels about the same.  She is seen slightly decreased cough and congestion.  Appetite is low with no nausea vomiting or diarrhea.  [facility] (anti-synthet[facility] syndrome (2014) (anti-Jo-1 antibody,) myositis on  CellCept and prednisone 10 mg. And Tacrolimius    She remains on oxygen 2 L at rest and 4 L with activity.  She is followed by Cardiology for CHF /CM . Has office visit with them today .     Allergies  Allergen Reactions  . 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 Sertraline Hcl Hives  . Tape Other (See Comments)    Burns skin    Immunization History  Administered Date(s) Administered  . Influenza Split 12/25/2011, 09/27/2012  . Influenza,inj,Quad PF,6+ Mos 10/28/2013, 08/07/2014, 09/29/2016  . Influenza-Unspecified 08/20/2015  . PPD Test 05/18/2012, 09/10/2012, 03/27/2014, 02/12/2015, 03/03/2017  . Pneumococcal Polysaccharide-23 02/16/2015  . Td 03/20/2006    Past Medical History:  Diagnosis Date  . Anxiety   . Arthritis    rheumatoid arthritis- mild, no rheumatology care   . Asthma    seasonal allergies   . Asymptomatic LV dysfunction    a. Echo in Dec 2011 with EF 35 to 40%. Felt to be due to paced rhythm. b. EF 25-30% in 07/2014.  . Bipolar affective disorder ([place]   . Cardiac pacemaker    a. Since age [age]in 139 b. Upgrade to BiV in 2013.  .Marland KitchenCarpal tunnel syndrome of right wrist   . CHF (congestive heart failure) ([place]   . Congenital complete AV block   . Depression    bipolar  . Diabetes mellitus without complication ([place]   . GERD (gastroesophageal reflux disease)   . Hypertension   . Interstitial lung disease ([place]   . Lupus   . Lupus (systemic lupus erythematosus) ([place]   . Obesity   . Pneumonitis    a.  a/w hypoxia - inflammatory - large workup 07/2014.  . Presence of permanent cardiac pacemaker   . Seizures ([place])    as a child- from high fever  . Sinus tachycardia     Tobacco History: Social History   Tobacco Use  Smoking Status Former Smoker  . Packs/day: 0.25  . Years: 0.50  . Pack years: 0.12  . Types: Cigarettes  . Last attempt to quit: 07/25/1996  . Years since quitting: 21.3  Smokeless Tobacco Never Used    Counseling given: Not Answered   Outpatient Encounter Medications as of 12/02/2017  Medication Sig  . amoxicillin-clavulanate (AUGMENTIN) 875-125 MG tablet Take 1 tablet by mouth every 12 (twelve) hours.  . benzonatate (TESSALON) 100 MG capsule Take 1 capsule (100 mg total) by mouth every 8 (eight) hours.  . calcium-vitamin D (OSCAL WITH D) 500-200 MG-UNIT TABS tablet Take 1 tablet by mouth 2 (two) times daily with a meal.  . carvedilol (COREG) 3.125 MG tablet Take 1 tablet (3.125 mg total) by mouth 2 (two) times daily.  [name] dicyclomine (BENTYL) 20 MG tablet Take 20 mg by mouth every 6 (six) hours.  [name] doxycycline (VIBRAMYCIN) 100 MG capsule Take 1 capsule (100 mg total) by mouth 2 (two) times daily for 7 days.  [name] EASY TOUCH PEN NEEDLES 31G X 8 MM MISC USE AS DIRECTED  . Etonogestrel (NEXPLANON [place]) Inject into the skin.  . furosemide (LASIX) 20 MG tablet Take 1 tablet (20 mg total) by mouth daily.  [name] KLOR-CON M20 20 MEQ tablet TAKE 1 TABLET BY MOUTH EVERY DAY  . LANTUS SOLOSTAR 100 UNIT/ML Solostar Pen INJECT 15 UNITS SUBCUTANEOUSLY ONCE DAILY  . loratadine (CLARITIN) 10 MG tablet Take 1 tablet (10 mg total) by mouth daily.  . mycophenolate (CELLCEPT) 500 MG tablet Take 1,000 mg by mouth 2 (two) times daily.   [name] NOVOLOG FLEXPEN 100 UNIT/ML FlexPen INJECT 8 UNITS INTO THE SKIN 3 (THREE) TIMES DAILY WITH MEALS.  [name] OXYGEN Inhale 4 L into the lungs.  . pantoprazole (PROTONIX) 40 MG tablet TAKE 1 TABLET (40MG TOTAL) BY MOUTH DAILY  . ranitidine (ZANTAC) 150 MG tablet Take 150 mg by mouth 2 (two) times daily.  . tacrolimus (PROGRAF) 1 MG capsule Take 2 mg by mouth 2 (two) times daily.   . Vitamin D, Ergocalciferol, (DRISDOL) 50000 units CAPS capsule Take 50,000 Units by mouth once a week.  . metFORMIN (GLUCOPHAGE) 1000 MG tablet Take 1 tablet (1,000 mg total) 2 (two) times daily with a meal by mouth. (Patient not taking: Reported on 12/02/2017)  . [DISCONTINUED] benzonatate (TESSALON) 200 MG  capsule Take 1 capsule (200 mg total) by mouth 3 (three) times daily as needed for cough.   No facility-administered encounter medications on file as of 12/02/2017.      Review of Systems  Constitutional:   No  weight loss, night sweats,  Fevers, chills, '+fatigue, or  lassitude.  HEENT:   No headaches,  Difficulty swallowing,  Tooth/dental problems, or  Sore throat,                No sneezing, itching, ear ache, + nasal congestion, post nasal drip,   CV:  No chest pain,  Orthopnea, PND, swelling in lower extremities, anasarca, dizziness, palpitations, syncope.   GI  No heartburn, indigestion, abdominal pain, nausea, vomiting, diarrhea, change in bowel habits, loss of appetite, bloody stools.   Resp:  .  No wheezing.  No chest wall deformity  Skin: no rash or  lesions.  GU: no dysuria, change in color of urine, no urgency or frequency.  No flank pain, no hematuria   MS:  No joint pain or swelling.  No decreased range of motion.  No back pain.    Physical Exam  BP (!) 98/56 (BP Location: Right Arm, Cuff Size: Normal)   Ht 5' 5"  (1.651 m)   Wt 156 lb 12.8 oz (71.1 kg)   SpO2 98%   BMI 26.09 kg/m   GEN: A/Ox3; pleasant , NAD, chronically ill-appearing on oxygen   HEENT:  [place]/AT,  EACs-clear, TMs-wnl, NOSE-clear, THROAT-clear, no lesions, no postnasal drip or exudate noted. Poor dentition   NECK:  Supple w/ fair ROM; no JVD; normal carotid impulses w/o bruits; no thyromegaly or nodules palpated; no lymphadenopathy.    RESP decreased breath sounds in the bases  no accessory muscle use, no dullness to percussion  CARD:  RRR, no m/r/g, no peripheral edema, pulses intact, no cyanosis or clubbing.  GI:   Soft & nt; nml bowel sounds; no organomegaly or masses detected.   Musco: Warm bil, no deformities or joint swelling noted.   Neuro: alert, no focal deficits noted.    Skin: Warm, no lesions or rashes     BMET   BNPlImaging: Dg Chest 2 View  Result Date:  12/01/2017 CLINICAL DATA:  Shortness of breath and chest pain EXAM: CHEST  2 VIEW COMPARISON:  10/01/2017 FINDINGS: Cardiac shadow is stable. Pacing device is again noted. Patchy fibrotic changes are again identified bilaterally stable from the previous study. No new infiltrate or effusion is seen. No bony abnormality is noted. IMPRESSION: Chronic changes without acute abnormality. Electronically Signed   By: Inez Catalina M.D.   On: 12/01/2017 11:09   Ct Angio Chest Pe W And/or Wo Contrast  Result Date: 12/01/2017 CLINICAL DATA:  Shortness of Breath EXAM: CT ANGIOGRAPHY CHEST WITH CONTRAST TECHNIQUE: Multidetector CT imaging of the chest was performed using the standard protocol during bolus administration of intravenous contrast. Multiplanar CT image reconstructions and MIPs were obtained to evaluate the vascular anatomy. CONTRAST:  80 mL ISOVUE-370 IOPAMIDOL (ISOVUE-370) INJECTION 76% COMPARISON:  Chest CT January 05, 2017 and chest radiograph December 01, 2017 and Feb 22, 2016 FINDINGS: Cardiovascular: There is no demonstrable pulmonary embolus. There is no appreciable thoracic aortic aneurysm or dissection. Pericardium is not appreciably thickened. There is no pericardial effusion. There is prominence of the main pulmonary outflow tract measuring 3.1 cm, felt to represent a degree of pulmonary arterial hypertension. Pacemaker leads are attached the right atrium, right ventricle, and coronary sinus. Mediastinum/Nodes: Visualized thyroid appears unremarkable. There is a right paratracheal lymph node measuring 1.4 x 1.3 cm. There is a right hilar lymph node measuring 1.3 x 1.3 cm. There is an aortopulmonary window lymph node measuring 1.7 x 1.3 cm. There is a left hilar measuring 1.2 x 1.3 cm. There is a subcarinal lymph node measuring 1.3 x 1.2 cm. No esophageal lesions are evident. Lungs/Pleura: As was noted previously, there is diffuse reticulonodular interstitial disease throughout the lungs diffusely with  multiple ground-glass type opacities, primarily in the lingula and lower lobes bilaterally. There is more confluent opacity in the posterior segment of the right upper lobe. A focal nodular opacity in the posterior segment of the left lower lobe measuring 6 x 6 mm is again noted and is stable, seen on axial slice 91 series 8. There is no appreciable pleural effusion. Upper Abdomen: There is reflux of contrast into the inferior vena  cava and hepatic veins. Visualized upper abdominal structures otherwise appear unremarkable. Musculoskeletal: No blastic or lytic bone lesions are evident. Review of the MIP images confirms the above findings. IMPRESSION: 1. No demonstrable pulmonary embolus. No thoracic aortic aneurysm or dissection. 2. There remains widespread reticulonodular interstitial disease with areas of ground-glass opacity, primarily in the lower lobes and lingular regions. These changes are similar compared to prior studies. Differential considerations include chronic post infectious/postinflammatory scarring, chronic allergic type reaction, a degree of chronic congestive heart failure with associated chronic inflammatory type change in the lungs, and sarcoidosis. 3. Stable 6 mm nodular opacity left lower lobe. This lesion has remained stable since 2017 is felt to have benign etiology. 4. Focal airspace consolidation posterior segment right upper lobe. Acute focus of pneumonia questioned. 5. Areas of adenopathy which could well have reactive etiology given the parenchymal lung changes. Adenopathy secondary to sarcoidosis is a consideration. 6. Prominence of the main pulmonary outflow tract, felt to represent a degree of pulmonary arterial hypertension. 7. Reflux of contrast into the inferior vena cava and hepatic veins, a finding felt to be indicative of increase in right heart pressure. Electronically Signed   By: Lowella Grip III M.D.   On: 12/01/2017 12:24     Assessment & Plan:   No  problem-specific Assessment & Plan notes found for this encounter.     Rexene Edison, NP 12/02/2017

## 2017-12-02 NOTE — Assessment & Plan Note (Signed)
Continue on oxygen 

## 2017-12-02 NOTE — Progress Notes (Signed)
Patient ID: Makayla Vasquez, female   DOB: 1981/07/22, 37 y.o.   MRN: [id number] PCP: Dr Natale Milch Cardiology: Dr Shirlee Latch Pulmonary: Dr [facility] and Dr Maple Hudson.   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   She has a h/o congenital HB and underwent first PPM at age 37. In 2011 had ECHO with EF 35-40%. LV dysfunction felt to be due to RV pacing so upgraded to CRT-P. Echo in 10/15 with EF 25%.  She also has concomitant lung disease and this has limited b-blocker dosing.  She follows with Dr. Maple Hudson for her lung disease.Admitted in 8/15 and 9/15 with worsening respiratory status and severe hypoxia. Had extensive w/u. Thought to have a viral pneumonitis complicated by atelectasis and mild edema vs. an autoimmune process. Discharged home on a course of levaquin and prednisone.   She was offered a lung biopsy for further diagnosis versus empiric treatment with steroids. She opted for empiric steroid treatment. She is followed by a rheumatologist and is on Cellcept and prednisone taper. 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  She is on oxygen and night and with activity.   Today she returns for HF follow up. Earlier this morning she was seen by pulmonary and started on antibiotics. Says she feels awful. Complaining of fatigue. She has not taken any medications for the last 2 days. Remains SOB with exertion. Denies PND/Orthopnea. Continues to wear 3-4 liters oxygen. Appetite fair. No chills.   PFTs (12/15) showed a severe restrictive defect concerning for an interstitial process.   EP study 10/15 for persistent tachycardia and found to have sinus tachycardia.   06/16/14 CT scan showed bilateral progressive lower peribronchial infiltrates with groundglass changes and nodular patter consistent with acute pneumonitis of unclear etiology.  06/20/14 BAL  showed 60% macrophages and 30% PMNs. Her AFB smear, fungal smear, legionella Ab, pneumocystis smear, ACE, and sputum culture are negative. Her AFB, legionella, fungal, and BAL culture as well as her fungal Ab, hypersensitivity pneumonitis panel were all negative Serology: DsDNA, RF, ANCA, HSP all negative  ECHO  1/17 showed EF 40-45%, mild LVH, diffuse hypokinesis.  8/17 with EF improved to 50-55%, PASP 48 mmHg.  12/11/2016 EF 45-50% RV ok.   RHC 10/26/2014  RA = 2 RV = 27/1/5 PA = 29/7 (18) PCW = 4 Fick cardiac output/index = 4.5/2.2 PVR = 3.0 WU FA sat = 98% PA sat = 64%, 61%  Labs (10/15): TSH normal K 3.6, creatinine 1.1 Labs (10/31/2014) L k 3.9 Creatinine 1.06  Labs 07/23/2015: K 3.8 Creatinine 0.82  Labs (3/17): K 4, creatinine 0.6 Labs (5/17): K 4.1, creatinine 0.7 Labs (1/18): K 3.6, creatinine 0.42 Labs (10/01/2017): K 3.8 Creatinine 0.62  Labs (12/01/2017): K 3.6 Creatinine 0.67  Review of Systems: All systems reviewed and negative except as per HPI.   Past Medical History:  Diagnosis Date  . Anxiety   . Arthritis    rheumatoid arthritis- mild, no rheumatology care   . Asthma    seasonal allergies   . Asymptomatic LV dysfunction    a. Echo in Dec 2011 with EF 35 to 40%. Felt to be due to paced rhythm. b. EF 25-30% in 07/2014.  . Bipolar affective disorder (HCC)   . Cardiac pacemaker    a. Since age 37 in 84. b. Upgrade to BiV in 2013.  [name] Carpal tunnel syndrome  of right wrist   . CHF (congestive heart failure) (HCC)   . Congenital complete AV block   . Depression    bipolar  . Diabetes mellitus without complication (HCC)   . GERD (gastroesophageal reflux disease)   . Hypertension   . Interstitial lung disease (HCC)   . Lupus   . Lupus (systemic lupus erythematosus) (HCC)   . Obesity   . Pneumonitis    a. a/w hypoxia - inflammatory - large workup 07/2014.  . Presence of permanent cardiac pacemaker   . Seizures (HCC)    as a child- from high fever  .  Sinus tachycardia     Current Outpatient Medications  Medication Sig Dispense Refill  . amoxicillin-clavulanate (AUGMENTIN) 875-125 MG tablet Take 1 tablet by mouth every 12 (twelve) hours. 14 tablet 0  . benzonatate (TESSALON) 100 MG capsule Take 1 capsule (100 mg total) by mouth every 8 (eight) hours. 21 capsule 0  . calcium-vitamin D (OSCAL WITH D) 500-200 MG-UNIT TABS tablet Take 1 tablet by mouth 2 (two) times daily with a meal.  11  . carvedilol (COREG) 3.125 MG tablet Take 1 tablet (3.125 mg total) by mouth 2 (two) times daily. 60 tablet 3  . dicyclomine (BENTYL) 20 MG tablet Take 20 mg by mouth every 6 (six) hours.    [name] doxycycline (VIBRAMYCIN) 100 MG capsule Take 1 capsule (100 mg total) by mouth 2 (two) times daily for 7 days. 14 capsule 0  . EASY TOUCH PEN NEEDLES 31G X 8 MM MISC USE AS DIRECTED 100 each 11  . Etonogestrel (NEXPLANON [place]) Inject into the skin.    . furosemide (LASIX) 20 MG tablet Take 1 tablet (20 mg total) by mouth daily. 90 tablet 3  . KLOR-CON M20 20 MEQ tablet TAKE 1 TABLET BY MOUTH EVERY DAY 30 tablet 3  . LANTUS SOLOSTAR 100 UNIT/ML Solostar Pen INJECT 15 UNITS SUBCUTANEOUSLY ONCE DAILY 15 mL 3  . loratadine (CLARITIN) 10 MG tablet Take 1 tablet (10 mg total) by mouth daily. 30 tablet 11  . metFORMIN (GLUCOPHAGE) 1000 MG tablet Take 1 tablet (1,000 mg total) 2 (two) times daily with a meal by mouth. 180 tablet 3  . mycophenolate (CELLCEPT) 500 MG tablet Take 1,000 mg by mouth 2 (two) times daily.     [name] NOVOLOG FLEXPEN 100 UNIT/ML FlexPen INJECT 8 UNITS INTO THE SKIN 3 (THREE) TIMES DAILY WITH MEALS. 15 pen 5  . OXYGEN Inhale 4 L into the lungs.    . pantoprazole (PROTONIX) 40 MG tablet TAKE 1 TABLET (40MG  TOTAL) BY MOUTH DAILY 90 tablet 3  . ranitidine (ZANTAC) 150 MG tablet Take 150 mg by mouth 2 (two) times daily.    . tacrolimus (PROGRAF) 1 MG capsule Take 2 mg by mouth 2 (two) times daily.     . Vitamin D, Ergocalciferol, (DRISDOL) 50000 units CAPS capsule  Take 50,000 Units by mouth once a week.  1   No current facility-administered medications for this encounter.     Allergies  Allergen Reactions  . 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 Sertraline Hcl Hives  . Tape Other (See Comments)    Burns skin      Social History   Socioeconomic History  . Marital status: Single    Spouse name: Not on file  . Number of children: 1  . Years of education: Not on file  . Highest education level:  Not on file  Social Needs  . Financial resource strain: Not on file  . Food insecurity - worry: Not on file  . Food insecurity - inability: Not on file  . Transportation needs - medical: Not on file  . Transportation needs - non-medical: Not on file  Occupational History  . Occupation: CNA  Tobacco Use  . Smoking status: Former Smoker    Packs/day: 0.25    Years: 0.50    Pack years: 0.12    Types: Cigarettes    Last attempt to quit: 07/25/1996    Years since quitting: 21.3  . Smokeless tobacco: Never Used  Substance and Sexual Activity  . Alcohol use: No  . Drug use: No  . Sexual activity: Yes    Birth control/protection: Implant  Other Topics Concern  . Not on file  Social History Narrative   Works at [employer].        Family History  Problem Relation Age of Onset  . Heart disease Mother        CHF (no details)  . Hypertension Mother   . Lupus Mother   . Heart disease Father        Murmur  . Heart disease Sister 51        No details.  History of a pacemaker    Vitals:   12/02/17 1220  BP: 100/62  Pulse: 90  SpO2: 97%  Weight: 157 lb (71.2 kg)    PHYSICAL EXAM: General:  Appears chronically ill and fatigued. No resp difficulty HEENT: normal Neck: supple. no JVD. Carotids 2+ bilat; no bruits. No lymphadenopathy or thryomegaly appreciated. Cor: PMI nondisplaced. Regular rate & rhythm. No rubs, gallops or murmurs. Lungs: decreased lung sounds  throughout on 4 liters oxygen.  Abdomen: soft, nontender, nondistended. No hepatosplenomegaly. No bruits or masses. Good bowel sounds. Extremities: no cyanosis, clubbing, rash, edema Neuro: alert & orientedx3, cranial nerves grossly intact. moves all 4 extremities w/o difficulty. Affect pleasant   Optivol: well above fluid index for > 4 weeks. No A fib No VT.  ASSESSMENT & PLAN: 1) Chronic systolic HF:  Echo 05/2014 EF 20-25%, echo 1/17 improved with EF 40-45% and echo in 8/17 with EF up to 50-55%.  ECHO 11/2016  EF 45-50%. Nonischemic cardiomyopathy.  Medtronic CRT-P device.  Optivol discussed and reviewed. Fluid trending up and impedance is going down.  Limited by ILD. NYHA III. Volume status trending up in the setting of missed medications.  - Increase lasix to 40 mg daily x 2 days then back to 20 mg daily.  - Keep Bisoprolol at 2.5 mg daily. - Renal function stable on 12/01/2017   2) Anti-synthetase syndrome with interstitial lung disease: Suspect this also causes the persistent weakness.  - Follows with Dr. Basilia Jumbo at [facility]  - Remains on 10 mg prednisone daily, tacrolimis increased. - Continues on Cellcept.   3) Congenital AVN block s/p pacer:  - Follows with Dr. Ladona Ridgel.   4) Chronic Respiratory Failure Followed by pulmonary. Sats ok today.  On 2-4 liters oxygen.    5) Pneumonia Saw Pulmonary earlier today and was started on antibiotics.   Follow up in 8 weeks with Dr Shirlee Latch.   Tearah Saulsbury NP-C 12/02/2017

## 2017-12-02 NOTE — Assessment & Plan Note (Signed)
Right upper lobe pneumonia noted on CT chest.  Patient is to continue on her antibiotics with Augmentin and doxycycline.  She is advised to take with food and eat yogurt.  She will return here in 2 weeks for a follow-up chest x-ray for clearance.  Plan  Patient Instructions  Finish Augmentin and Doxycycline as directed.  Mucinex DM Twice daily  As needed  Cough/congestion  Fluids and rest  Tylenol As needed   Take antibiotics with food . Eat yogurt.  Continue on Oxygen 2l/m rest and 4l/m with activity .  Follow up with Dr. Maple Hudson  In 2 weeks with chest xray and As needed   Please contact office for sooner follow up if symptoms do not improve or worsen or seek emergency care

## 2017-12-02 NOTE — Patient Instructions (Signed)
INCREASE Lasix to 40 mg daily for TWO days, then reduce back to normal daily dose of 20 mg once daily.  Follow up 8 weeks with Dr. Shirlee Latch.  ________________________________________________________ [place] Code: 1100  Take all medication as prescribed the day of your appointment. Bring all medications with you to your appointment.  Do the following things EVERYDAY: 1) Weigh yourself in the morning before breakfast. Write it down and keep it in a log. 2) Take your medicines as prescribed 3) Eat low salt foods-Limit salt (sodium) to 2000 mg per day.  4) Stay as active as you can everyday 5) Limit all fluids for the day to less than 2 liters

## 2017-12-02 NOTE — Assessment & Plan Note (Signed)
Stable changes on CT chest  patient does have an acute pneumonia currently on antibiotics. Patient is continue on her current oxygen level.  On return can consider setting up PFT for later this summer

## 2017-12-02 NOTE — Patient Instructions (Addendum)
Finish Augmentin and Doxycycline as directed.  Mucinex DM Twice daily  As needed  Cough/congestion  Fluids and rest  Tylenol As needed   Take antibiotics with food . Eat yogurt.  Continue on Oxygen 2l/m rest and 4l/m with activity .  Follow up with Dr. Maple Hudson  In 2 weeks with chest xray and As needed   Please contact office for sooner follow up if symptoms do not improve or worsen or seek emergency care

## 2017-12-03 LAB — MYCOPHENOLIC ACID (CELLCEPT)
MPA Glucuronide: NOT DETECTED ug/mL (ref 15–125)
MPA: 0.4 ug/mL — ABNORMAL LOW (ref 1.0–3.5)

## 2017-12-03 LAB — TACROLIMUS LEVEL: TACROLIMUS (FK506) - LABCORP: 1.1 ng/mL — AB (ref 2.0–20.0)

## 2017-12-09 ENCOUNTER — Ambulatory Visit: Payer: Medicare Other | Admitting: Internal Medicine

## 2017-12-10 ENCOUNTER — Other Ambulatory Visit: Payer: Self-pay | Admitting: Student in an Organized Health Care Education/Training Program

## 2017-12-14 ENCOUNTER — Encounter (HOSPITAL_COMMUNITY): Payer: Self-pay | Admitting: Emergency Medicine

## 2017-12-14 ENCOUNTER — Encounter: Payer: Self-pay | Admitting: Family Medicine

## 2017-12-14 ENCOUNTER — Other Ambulatory Visit: Payer: Self-pay

## 2017-12-14 ENCOUNTER — Ambulatory Visit (INDEPENDENT_AMBULATORY_CARE_PROVIDER_SITE_OTHER): Payer: Medicare Other | Admitting: Family Medicine

## 2017-12-14 VITALS — BP 84/40 | HR 113 | Temp 97.6°F | Ht 65.0 in | Wt 149.0 lb

## 2017-12-14 DIAGNOSIS — E119 Type 2 diabetes mellitus without complications: Secondary | ICD-10-CM | POA: Diagnosis not present

## 2017-12-14 DIAGNOSIS — R109 Unspecified abdominal pain: Secondary | ICD-10-CM | POA: Diagnosis not present

## 2017-12-14 DIAGNOSIS — Z79899 Other long term (current) drug therapy: Secondary | ICD-10-CM | POA: Insufficient documentation

## 2017-12-14 DIAGNOSIS — Z950 Presence of cardiac pacemaker: Secondary | ICD-10-CM | POA: Insufficient documentation

## 2017-12-14 DIAGNOSIS — I5032 Chronic diastolic (congestive) heart failure: Secondary | ICD-10-CM | POA: Diagnosis not present

## 2017-12-14 DIAGNOSIS — R0981 Nasal congestion: Secondary | ICD-10-CM | POA: Diagnosis not present

## 2017-12-14 DIAGNOSIS — J101 Influenza due to other identified influenza virus with other respiratory manifestations: Secondary | ICD-10-CM | POA: Diagnosis not present

## 2017-12-14 DIAGNOSIS — Z87891 Personal history of nicotine dependence: Secondary | ICD-10-CM | POA: Diagnosis not present

## 2017-12-14 DIAGNOSIS — Z794 Long term (current) use of insulin: Secondary | ICD-10-CM | POA: Diagnosis not present

## 2017-12-14 DIAGNOSIS — J984 Other disorders of lung: Secondary | ICD-10-CM | POA: Diagnosis not present

## 2017-12-14 DIAGNOSIS — J45909 Unspecified asthma, uncomplicated: Secondary | ICD-10-CM | POA: Diagnosis not present

## 2017-12-14 DIAGNOSIS — I9589 Other hypotension: Secondary | ICD-10-CM

## 2017-12-14 DIAGNOSIS — J181 Lobar pneumonia, unspecified organism: Secondary | ICD-10-CM | POA: Diagnosis not present

## 2017-12-14 DIAGNOSIS — J189 Pneumonia, unspecified organism: Secondary | ICD-10-CM

## 2017-12-14 DIAGNOSIS — I110 Hypertensive heart disease with heart failure: Secondary | ICD-10-CM | POA: Diagnosis not present

## 2017-12-14 DIAGNOSIS — R1010 Upper abdominal pain, unspecified: Secondary | ICD-10-CM | POA: Diagnosis not present

## 2017-12-14 DIAGNOSIS — I959 Hypotension, unspecified: Secondary | ICD-10-CM

## 2017-12-14 DIAGNOSIS — R000 Tachycardia, unspecified: Secondary | ICD-10-CM | POA: Diagnosis not present

## 2017-12-14 LAB — COMPREHENSIVE METABOLIC PANEL
ALK PHOS: 62 U/L (ref 38–126)
ALT: 27 U/L (ref 14–54)
AST: 40 U/L (ref 15–41)
Albumin: 3.2 g/dL — ABNORMAL LOW (ref 3.5–5.0)
Anion gap: 13 (ref 5–15)
BILIRUBIN TOTAL: 0.7 mg/dL (ref 0.3–1.2)
BUN: 11 mg/dL (ref 6–20)
CALCIUM: 8.5 mg/dL — AB (ref 8.9–10.3)
CO2: 19 mmol/L — ABNORMAL LOW (ref 22–32)
Chloride: 102 mmol/L (ref 101–111)
Creatinine, Ser: 0.77 mg/dL (ref 0.44–1.00)
Glucose, Bld: 234 mg/dL — ABNORMAL HIGH (ref 65–99)
Potassium: 3.5 mmol/L (ref 3.5–5.1)
Sodium: 134 mmol/L — ABNORMAL LOW (ref 135–145)
Total Protein: 7.2 g/dL (ref 6.5–8.1)

## 2017-12-14 LAB — URINALYSIS, ROUTINE W REFLEX MICROSCOPIC
BILIRUBIN URINE: NEGATIVE
Glucose, UA: NEGATIVE mg/dL
Ketones, ur: NEGATIVE mg/dL
LEUKOCYTES UA: NEGATIVE
Nitrite: NEGATIVE
PH: 5 (ref 5.0–8.0)
Protein, ur: 30 mg/dL — AB
SPECIFIC GRAVITY, URINE: 1.024 (ref 1.005–1.030)

## 2017-12-14 LAB — CBC
HCT: 36.9 % (ref 36.0–46.0)
Hemoglobin: 11.3 g/dL — ABNORMAL LOW (ref 12.0–15.0)
MCH: 25.3 pg — ABNORMAL LOW (ref 26.0–34.0)
MCHC: 30.6 g/dL (ref 30.0–36.0)
MCV: 82.7 fL (ref 78.0–100.0)
PLATELETS: 315 10*3/uL (ref 150–400)
RBC: 4.46 MIL/uL (ref 3.87–5.11)
RDW: 15.7 % — AB (ref 11.5–15.5)
WBC: 7.3 10*3/uL (ref 4.0–10.5)

## 2017-12-14 LAB — I-STAT BETA HCG BLOOD, ED (MC, WL, AP ONLY)

## 2017-12-14 LAB — LIPASE, BLOOD: Lipase: 28 U/L (ref 11–51)

## 2017-12-14 NOTE — Progress Notes (Signed)
End of Mood Disorder Clinic - Review  Last seen in [facility] in 2010.  She was referred back in Feb 2018 and appt made for June 2018 -- but she did not keep appointment.  Does not need any further follow up as not on any high dose psychiatric medications.  Continue to FU with PCP for any further mood disorder issues.    Renold Don, MD [facility]

## 2017-12-14 NOTE — ED Triage Notes (Signed)
Pt sent from PCP for hypotension, states BP was 84/40. Pt c/o generalized fatigue, nausea/vomiting and abdominal pain x 1 week. Normotensive in triage.

## 2017-12-14 NOTE — Progress Notes (Addendum)
Subjective:    Patient ID: Makayla Vasquez , female   DOB: 12-02-1980 , 37 y.o..   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 cough and congestion x 5 days    1. Cough and congestion after RUL Pneumonia: Patient notes that she is continued to have cough and congestion since she was diagnosed with community acquired pneumonia on February 12.  She notes that she was given a prescription for Augmentin and doxycycline, her last doses of these were yesterday.  She notes that if she feels the exact same as when she was diagnosed with pneumonia. She has not had any worsening shortness of breath or chest pain. She has been using 3 L of O2, has not needed to go up at all. Notes that she has been sneezing more and feeling "chills" but no fever. Has not tried any OTC meds. Has felt lightheadedness and dizziness. Has been drinking some liquids but overall her appetite is down. Has been taking her Lasix. Endorses fatigue and low energy.   Review of Systems: Per HPI.   Past Medical History: Patient Active Problem List   Diagnosis Date Noted  . Concern about breast cancer in female without diagnosis 08/25/2017  . Acute upper respiratory infection 08/20/2017  . Dyspareunia, female 05/14/2017  . 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 On Cellcept therapy   . On prednisone therapy   . Systemic lupus erythematosus (HCC)   . Long term (current) use of systemic steroids   . Chronic diastolic congestive heart failure (HCC)   . Cough   . Anemia of chronic disease   . Bipolar affective disorder in remission (HCC)   . Cardiac pacemaker in situ   . Adrenal insufficiency (HCC)   . Antisynthetase syndrome  (HCC)   . Idiopathic hypotension 07/08/2015  . Hyperlipidemia associated with type 2 diabetes mellitus (HCC) 05/04/2015  . Diabetes mellitus type 2 in obese (HCC)   . Chronic respiratory failure with hypoxia (HCC) 08/07/2014  . Insertion of implantable subdermal contraceptive 07/12/2014  . ILD (interstitial lung disease) (HCC) 06/19/2014  . Congestive dilated cardiomyopathy (HCC) 05/31/2014  . Pneumonia 05/09/2014  . Chronic systolic heart failure (HCC) 02/26/2012  . PPM-Medtronic   . Congenital complete AV block   . GERD (gastroesophageal reflux disease)   . THYROGLOSSAL DUCT CYST 07/18/2010  . POLYCYSTIC OVARY 12/17/2006    Medications: reviewed   Social Hx:  reports that she quit smoking about 21 years ago. Her smoking use included cigarettes. She has a 0.12 pack-year smoking history. she has never used smokeless tobacco.   Objective:   BP (!) 84/40   Pulse (!) 113   Temp 97.6 F (36.4 C) (Oral)   Ht 5\' 5"  (1.651 m)   Wt 149 lb (67.6 kg)   SpO2 93% Comment: ON 3 LITERS  BMI 24.79 kg/m  Physical Exam  Gen: NAD, alert, cooperative with exam, appears ill but non toxic HEENT: NCAT, PERRL, clear conjunctiva, oropharynx clear, supple neck Cardiac: Tachycardic rate and regular rhythm, normal S1/S2, no murmur, no edema, capillary refill brisk  Respiratory: No increased work of breathing, mild rhonchorous sounds bilaterally but no crackles or wheezing.  Nasal cannula in  place Gastrointestinal: soft, non tender, non distended, bowel sounds present Skin: no rashes, normal turgor  Neurological: no gross deficits.   Assessment & Plan:   1. Symptomatic hypotension: Patient has multiple medical conditions including dilated cardiomyopathy with a pacemaker, interstitial lung disease with chronic respiratory failure on supplemental oxygen, SLE, and anti-synthetase syndrome.  Initially presented for persistent cough and congestion in setting of CAP, however on exam she does have  hypotension to 84/40 initially, 85/50 on recheck, endorsing lightheadedness and dizziness.  She is also tachycardic to 113 with O2 at 93% on home 3 L nasal cannula. Weight loss (157 lbs on 2/13 and 149 lbs today). She is afebrile but wonder if she is septic from unresolved right upper lobe pneumonia (? Immunosuppressive's suppressing fever) versus dehydration (?over diuresed with Lasix in the setting of poor p.o. Intake).  Discussed patient with Dr. Deirdre Priest and the plan is as follows; -Sending patient to emergency department for further assessment  2.  Cough and congestion in the setting of right upper lobe pneumonia: Recently diagnosed with RUL pneumonia on December 01, 2017.  She has completed her antibiotic course of doxycycline and Augmentin yesterday.  Patient has some mild rhonchorous breath sounds bilaterally but no distinct crackles or wheezing.  She is afebrile although she is on immunosuppressive's.  Unclear if patient has worsening/unresolved infection which is causing her vital signs to change in unresolved symptoms or if there is something else going on - Sending patient to ED as above  Makayla Simmonds, MD [facility], PGY-3

## 2017-12-15 ENCOUNTER — Emergency Department (HOSPITAL_COMMUNITY): Payer: Medicare Other

## 2017-12-15 ENCOUNTER — Emergency Department (HOSPITAL_COMMUNITY)
Admission: EM | Admit: 2017-12-15 | Discharge: 2017-12-15 | Disposition: A | Discharge status: Home / Self Care | Payer: Medicare Other | Attending: Emergency Medicine | Admitting: Emergency Medicine

## 2017-12-15 DIAGNOSIS — J101 Influenza due to other identified influenza virus with other respiratory manifestations: Secondary | ICD-10-CM | POA: Diagnosis not present

## 2017-12-15 DIAGNOSIS — R109 Unspecified abdominal pain: Secondary | ICD-10-CM | POA: Diagnosis not present

## 2017-12-15 DIAGNOSIS — J984 Other disorders of lung: Secondary | ICD-10-CM | POA: Diagnosis not present

## 2017-12-15 LAB — CBG MONITORING, ED: GLUCOSE-CAPILLARY: 112 mg/dL — AB (ref 65–99)

## 2017-12-15 LAB — INFLUENZA PANEL BY PCR (TYPE A & B)
Influenza A By PCR: POSITIVE — AB
Influenza B By PCR: NEGATIVE

## 2017-12-15 MED ORDER — OSELTAMIVIR PHOSPHATE 75 MG PO CAPS
75.0000 mg | ORAL_CAPSULE | Freq: Once | ORAL | Status: AC
  Administered 2017-12-15: 75 mg via ORAL
  Filled 2017-12-15: qty 1

## 2017-12-15 MED ORDER — OSELTAMIVIR PHOSPHATE 75 MG PO CAPS: 75.0000 mg | ORAL_CAPSULE | Freq: Two times a day (BID) | ORAL | 0 refills | Status: DC

## 2017-12-15 MED ORDER — SODIUM CHLORIDE 0.9 % IV BOLUS (SEPSIS)
500.0000 mL | Freq: Once | INTRAVENOUS | Status: AC
  Administered 2017-12-15: 500 mL via INTRAVENOUS

## 2017-12-15 MED ORDER — IOPAMIDOL (ISOVUE-370) INJECTION 76%
INTRAVENOUS | Status: AC
  Administered 2017-12-15: 100 mL
  Filled 2017-12-15: qty 100

## 2017-12-15 NOTE — ED Notes (Signed)
Patient transported to CT 

## 2017-12-15 NOTE — ED Notes (Signed)
PT states understanding of care given, follow up care, and medication prescribed. PT ambulated from ED to car with a steady gait. 

## 2017-12-15 NOTE — Discharge Instructions (Signed)
It was my pleasure taking care of you today!   Please take Tamiflu as directed.   Please keep your scheduled appointment with your pulmonologist.   Return to ER immediately for new or worsening symptoms, any additional concerns. It is important that you return if you are having to increase your oxygen requirement as well.

## 2017-12-15 NOTE — ED Provider Notes (Signed)
[facility] EMERGENCY DEPARTMENT Provider Note   CSN: [id number] Arrival date & time: 12/14/17  1810     History   Chief Complaint Chief Complaint  Patient presents with  . Abdominal Pain    HPI ANGLINA Vasquez is a 37 y.o. female.  The history is provided by the patient and medical records. No language interpreter was used.  Abdominal Pain   Associated symptoms include nausea. Pertinent negatives include fever and vomiting.  Makayla Vasquez is a 37 y.o. female  with a PMH of interstitial lung disease, diabetes, lupus who presents to the Emergency Department complaining of upper abdominal pain for the last week.  Associated symptoms include productive cough, congestion and weakness.  She was seen on 2/2 for similar symptoms where she was diagnosed with pneumonia.  She reports taking all antibiotics as prescribed until completion and was feeling better.  Day or 2 after antibiotic course completed, her symptoms returned.  She went to see her primary care doctor today and was noted to be hypotensive.  She was sent to the emergency department for further evaluation. She is on 2-3L o2 at home and has not had to increase o2 recently.   Past Medical History:  Diagnosis Date  . Anxiety   . Arthritis    rheumatoid arthritis- mild, no rheumatology care   . Asthma    seasonal allergies   . Asymptomatic LV dysfunction    a. Echo in Dec 2011 with EF 35 to 40%. Felt to be due to paced rhythm. b. EF 25-30% in 07/2014.  . Bipolar affective disorder (HCC)   . Cardiac pacemaker    a. Since age 68 in 49. b. Upgrade to BiV in 2013.  [name] Carpal tunnel syndrome of right wrist   . CHF (congestive heart failure) (HCC)   . Congenital complete AV block   . Depression    bipolar  . Diabetes mellitus without complication (HCC)   . GERD (gastroesophageal reflux disease)   . Hypertension   . Interstitial lung disease (HCC)   . Lupus   . Lupus (systemic lupus erythematosus) (HCC)   .  Obesity   . Pneumonitis    a. a/w hypoxia - inflammatory - large workup 07/2014.  . Presence of permanent cardiac pacemaker   . Seizures (HCC)    as a child- from high fever  . Sinus tachycardia     Patient Active Problem List   Diagnosis Date Noted  . Concern about breast cancer in female without diagnosis 08/25/2017  . Acute upper respiratory infection 08/20/2017  . Dyspareunia, female 05/14/2017  . 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 On Cellcept therapy   . On prednisone therapy   . Systemic lupus erythematosus (HCC)   . Long term (current) use of systemic steroids   . Chronic diastolic congestive heart failure (HCC)   . Cough   . Anemia of chronic disease   . Bipolar affective disorder in remission (HCC)   . Cardiac pacemaker in situ   . Adrenal insufficiency (HCC)   . Antisynthetase syndrome (HCC)   . Idiopathic hypotension 07/08/2015  . Hyperlipidemia associated with type 2 diabetes mellitus (HCC) 05/04/2015  . Diabetes mellitus type 2 in obese (HCC)   . Chronic respiratory failure with hypoxia (HCC) 08/07/2014  . Insertion of implantable subdermal contraceptive 07/12/2014  . ILD (interstitial lung disease) (HCC) 06/19/2014  . Congestive dilated cardiomyopathy (HCC) 05/31/2014  . Pneumonia 05/09/2014  . Chronic systolic heart failure (HCC) 02/26/2012  .  PPM-Medtronic   . Congenital complete AV block   . GERD (gastroesophageal reflux disease)   . THYROGLOSSAL DUCT CYST 07/18/2010  . POLYCYSTIC OVARY 12/17/2006    Past Surgical History:  Procedure Laterality Date  . ATRIAL TACH ABLATION N/A 08/14/2014   Procedure: ATRIAL TACH ABLATION;  Surgeon: Marinus Maw, MD;  Location: [facility] CATH LAB;  Service: Cardiovascular;  Laterality: N/A;  . BI-VENTRICULAR PACEMAKER UPGRADE N/A 03/08/2012   Procedure: BI-VENTRICULAR PACEMAKER UPGRADE;  Surgeon: Marinus Maw, MD;  Location: [facility] CATH LAB;  Service: Cardiovascular;  Laterality: N/A;  . CARPAL TUNNEL WITH CUBITAL TUNNEL Right  07/26/2013   Procedure: RIGHT LIMITED OPEN CARPAL TUNNEL RELEASE ,  RIGHT CUBITAL TUNNEL RELEASE, INSITU VERSES ULNAR NERVE DECOMPRESSION AND ANTERIOR TRANSPOSITION;  Surgeon: Dominica Severin, MD;  Location: [facility] OR;  Service: Orthopedics;  Laterality: Right;  . CESAREAN SECTION    . CHOLECYSTECTOMY    . CYST EXCISION  12/10/2012   THYROID  . INSERT / REPLACE / REMOVE PACEMAKER     2001  . IUD REMOVAL  11/03/2011   Procedure: INTRAUTERINE DEVICE (IUD) REMOVAL;  Surgeon: Myra C. Marice Potter, MD;  Location: [facility] ORS;  Service: Gynecology;  Laterality: N/A;  . NASAL FRACTURE SURGERY     /w plate   . RIGHT HEART CATHETERIZATION N/A 10/26/2014   Procedure: RIGHT HEART CATH;  Surgeon: Dolores Patty, MD;  Location: [facility] CATH LAB;  Service: Cardiovascular;  Laterality: N/A;  . THROAT SURGERY  1994   s/p laser treatment  . THYROGLOSSAL DUCT CYST N/A 12/10/2012   Procedure: REVISION OF THYROGLOSSAL DUCT CYST EXCISION;  Surgeon: Serena Colonel, MD;  Location: [facility] OR;  Service: ENT;  Laterality: N/A;  Revision of Thyroglossal Duct Cyst Excision  . VIDEO BRONCHOSCOPY Bilateral 06/19/2014   Procedure: VIDEO BRONCHOSCOPY WITHOUT FLUORO;  Surgeon: Kalman Shan, MD;  Location: [facility] ENDOSCOPY;  Service: Cardiopulmonary;  Laterality: Bilateral;    OB History    Gravida Para Term Preterm AB Living   1 1 1     1    SAB TAB Ectopic Multiple Live Births                   Home Medications    Prior to Admission medications   Medication Sig Start Date End Date Taking? Authorizing Provider  benzonatate (TESSALON) 100 MG capsule Take 1 capsule (100 mg total) by mouth every 8 (eight) hours. 12/01/17  Yes Joy, Shawn C, PA-C  calcium-vitamin D (OSCAL WITH D) 500-200 MG-UNIT TABS tablet Take 1 tablet by mouth 2 (two) times daily with a meal. 09/03/17  Yes [provider]  carvedilol (COREG) 3.125 MG tablet Take 1 tablet (3.125 mg total) by mouth 2 (two) times daily. 11/19/17 02/17/18 Yes Clegg, Amy D, NP  dicyclomine  (BENTYL) 20 MG tablet Take 20 mg by mouth every 6 (six) hours.   Yes [provider]  Etonogestrel (NEXPLANON [place]) Inject into the skin once.    Yes [provider]  furosemide (LASIX) 20 MG tablet Take 1 tablet (20 mg total) by mouth daily. 11/03/17  Yes Clegg, Amy D, NP  KLOR-CON M20 20 MEQ tablet TAKE 1 TABLET BY MOUTH EVERY DAY 11/26/17  Yes Bensimhon, Bevelyn Buckles, MD  LANTUS SOLOSTAR 100 UNIT/ML Solostar Pen INJECT 15 UNITS SUBCUTANEOUSLY ONCE DAILY Patient taking differently: INJECT 27 UNITS SUBCUTANEOUSLY ONCE DAILY 05/15/17  Yes Marquette Saa, MD  loratadine (CLARITIN) 10 MG tablet Take 1 tablet (10 mg total) by mouth daily. 06/26/17  Yes Arvilla Market, DO  metFORMIN (GLUCOPHAGE) 1000 MG tablet Take 1 tablet (1,000 mg total) 2 (two) times daily with a meal by mouth. 08/24/17  Yes Marquette Saa, MD  mycophenolate (CELLCEPT) 500 MG tablet Take 1,000 mg by mouth 2 (two) times daily.    Yes [provider]  NOVOLOG FLEXPEN 100 UNIT/ML FlexPen INJECT 8 UNITS INTO THE SKIN 3 (THREE) TIMES DAILY WITH MEALS. 09/30/17  Yes Marquette Saa, MD  pantoprazole (PROTONIX) 40 MG tablet TAKE 1 TABLET (40MG  TOTAL) BY MOUTH DAILY 05/15/17  Yes 05/17/17, MD  ranitidine (ZANTAC) 150 MG tablet Take 150 mg by mouth 2 (two) times daily.   Yes [provider]  tacrolimus (PROGRAF) 0.5 MG capsule Take 1.5 mg by mouth 2 (two) times daily. 11/27/17  Yes [provider]  Vitamin D, Ergocalciferol, (DRISDOL) 50000 units CAPS capsule Take 50,000 Units by mouth once a week. 09/27/16  Yes [provider]  amoxicillin-clavulanate (AUGMENTIN) 875-125 MG tablet Take 1 tablet by mouth every 12 (twelve) hours. Patient not taking: Reported on 12/15/2017 12/01/17   01/29/18, PA-C  EASY TOUCH PEN NEEDLES 31G X 8 MM MISC USE AS DIRECTED 08/14/17   08/16/17, MD  oseltamivir (TAMIFLU) 75 MG capsule Take 1 capsule  (75 mg total) by mouth every 12 (twelve) hours. You received first dose in the Emergency Department tonight. 12/15/17   Korine Winton, 12/17/17, PA-C  OXYGEN Inhale 4 L into the lungs.    [provider]    Family History Family History  Problem Relation Age of Onset  . Heart disease Mother        CHF (no details)  . Hypertension Mother   . Lupus Mother   . Heart disease Father        Murmur  . Heart disease Sister 64        No details.  History of a pacemaker    Social History Social History   Tobacco Use  . Smoking status: Former Smoker    Packs/day: 0.25    Years: 0.50    Pack years: 0.12    Types: Cigarettes    Last attempt to quit: 07/25/1996    Years since quitting: 21.4  . Smokeless tobacco: Never Used  Substance Use Topics  . Alcohol use: No  . Drug use: No     Allergies   Atorvastatin; Sertraline hcl; and Tape   Review of Systems Review of Systems  Constitutional: Negative for chills and fever.  HENT: Positive for congestion. Negative for sore throat.   Respiratory: Positive for cough. Negative for shortness of breath and wheezing.   Cardiovascular: Negative for chest pain, palpitations and leg swelling.  Gastrointestinal: Positive for abdominal pain and nausea. Negative for vomiting.  Neurological: Positive for weakness.  All other systems reviewed and are negative.    Physical Exam Updated Vital Signs BP 111/72 (BP Location: Right Arm)   Pulse 100   Temp 98.5 F (36.9 C) (Oral)   Resp 16   SpO2 98%   Physical Exam  Constitutional: She is oriented to person, place, and time. She appears well-developed and well-nourished. No distress.  Non-toxic appearing.  HENT:  Head: Normocephalic and atraumatic.  Cardiovascular: Normal heart sounds.  No murmur heard. Regular rate and rhythm on exam.  Pulmonary/Chest: Effort normal. No respiratory distress.  Rhonchorous breath sounds bilaterally.  No crackles or wheezing appreciated.  On 2 L O2 which  is baseline oxygen requirement -  speaking in full sentences without difficulty.  Abdominal: Soft. She exhibits no distension. There is tenderness.  Tenderness to palpation of the upper abdomen without rebound or guarding.  Negative Murphy's.  Neurological: She is alert and oriented to person, place, and time.  Skin: Skin is warm and dry.  Nursing note and vitals reviewed.    ED Treatments / Results  Labs (all labs ordered are listed, but only abnormal results are displayed) Labs Reviewed  COMPREHENSIVE METABOLIC PANEL - Abnormal; Notable for the following components:      Result Value   Sodium 134 (*)    CO2 19 (*)    Glucose, Bld 234 (*)    Calcium 8.5 (*)    Albumin 3.2 (*)    All other components within normal limits  CBC - Abnormal; Notable for the following components:   Hemoglobin 11.3 (*)    MCH 25.3 (*)    RDW 15.7 (*)    All other components within normal limits  URINALYSIS, ROUTINE W REFLEX MICROSCOPIC - Abnormal; Notable for the following components:   APPearance HAZY (*)    Hgb urine dipstick LARGE (*)    Protein, ur 30 (*)    Bacteria, UA RARE (*)    Squamous Epithelial / LPF 0-5 (*)    All other components within normal limits  INFLUENZA PANEL BY PCR (TYPE A & B) - Abnormal; Notable for the following components:   Influenza A By PCR POSITIVE (*)    All other components within normal limits  CBG MONITORING, ED - Abnormal; Notable for the following components:   Glucose-Capillary 112 (*)    All other components within normal limits  LIPASE, BLOOD  I-STAT BETA HCG BLOOD, ED ([facility], [facility], [facility] ONLY)    EKG  EKG Interpretation None       Radiology Ct Angio Chest/abd/pel For Dissection W And/or Wo Contrast  Result Date: 12/15/2017 CLINICAL DATA:  37 y/o F; hypotension, generalized fatigue, nausea, vomiting, and abdominal pain for 1 week. History of pneumonitis, lupus, interstitial lung disease, hypertension, GERD, diabetes, av block, CHF, asthma, cardiac  catheterization, cholecystectomy, C-section, and pacemaker. EXAM: CT ANGIOGRAPHY CHEST, ABDOMEN AND PELVIS TECHNIQUE: Multidetector CT imaging through the chest, abdomen and pelvis was performed using the standard protocol during bolus administration of intravenous contrast. Multiplanar reconstructed images and MIPs were obtained and reviewed to evaluate the vascular anatomy. CONTRAST:  ISOVUE-370 IOPAMIDOL (ISOVUE-370) INJECTION 76% COMPARISON:  12/01/2017 CT chest.  01/04/2017 CT abdomen and pelvis. FINDINGS: CTA CHEST FINDINGS Cardiovascular: Preferential opacification of the thoracic aorta. No evidence of thoracic aortic aneurysm or dissection. Main pulmonary artery measures 3.5 cm. 3 lead pacemaker noted. Mild cardiomegaly. No pericardial effusion. Satisfactory opacification of the pulmonary arteries. No pulmonary embolus identified. Mediastinum/Nodes: Stable paratracheal, hilar, AP window lymphadenopathy measuring up to 13 x 17 mm in AP window (series 7, image 47). Lungs/Pleura: Stable diffuse reticulonodular interstitial disease with ground-glass opacities greatest in the lower lobes in the lingula is similar to prior studies. Interval resolution of right upper lobe posterior segment consolidation. No new airspace opacity identified. Stable subcentimeter nodule within the posterior basal segment of left lower lobe (series 8, image 103). No pleural effusion or pneumothorax. Musculoskeletal: No acute fracture. Review of the MIP images confirms the above findings. CTA ABDOMEN AND PELVIS FINDINGS VASCULAR Aorta: Normal caliber aorta without aneurysm, dissection, vasculitis or significant stenosis. Celiac: Patent without evidence of aneurysm, dissection, vasculitis or significant stenosis. SMA: Patent without evidence of aneurysm, dissection, vasculitis or significant  stenosis. Renals: Both renal arteries are patent without evidence of aneurysm, dissection, vasculitis, fibromuscular dysplasia or significant  stenosis. IMA: Patent without evidence of aneurysm, dissection, vasculitis or significant stenosis. Inflow: Patent without evidence of aneurysm, dissection, vasculitis or significant stenosis. Veins: No obvious venous abnormality within the limitations of this arterial phase study. Review of the MIP images confirms the above findings. NON-VASCULAR Hepatobiliary: Focal fatty deposition along falciform ligament is stable. Cholecystectomy. No significant biliary ductal dilatation. Pancreas: Unremarkable. No pancreatic ductal dilatation or surrounding inflammatory changes. Spleen: Normal in size without focal abnormality. Adrenals/Urinary Tract: Adrenal glands are unremarkable. Kidneys are normal, without renal calculi, focal lesion, or hydronephrosis. Bladder is unremarkable. Stomach/Bowel: Stomach is within normal limits. Appendix appears normal. No evidence of bowel wall thickening, distention, or inflammatory changes. Lymphatic: No significant vascular findings are present. No enlarged abdominal or pelvic lymph nodes. Reproductive: Uterus and bilateral adnexa are unremarkable. Other: No abdominal wall hernia or abnormality. No abdominopelvic ascites. Musculoskeletal: Sacralization of L5 vertebral body. Faint sclerosis and femoral heads may represent mild avascular necrosis, no subchondral fracture or collapse. T10 vertebral body segmentation anomaly is stable. No acute fracture. Review of the MIP images confirms the above findings. IMPRESSION: 1. No acute process identified.  No acute aortic abnormality. 2. Stable chronic reticulonodular interstitial lung disease, likely related to history of lupus. Interval resolution of right upper lobe consolidation. Stable mediastinal adenopathy, likely reactive. 3. Enlarged main pulmonary artery may represent pulmonary artery hypertension. Electronically Signed   By: Mitzi Hansen M.D.   On: 12/15/2017 03:17    Procedures Procedures (including critical care  time)  Medications Ordered in ED Medications  sodium chloride 0.9 % bolus 500 mL (0 mLs Intravenous Stopped 12/15/17 0239)  iopamidol (ISOVUE-370) 76 % injection (100 mLs  Contrast Given 12/15/17 0243)  oseltamivir (TAMIFLU) capsule 75 mg (75 mg Oral Given 12/15/17 0443)     Initial Impression / Assessment and Plan / ED Course  I have reviewed the triage vital signs and the nursing notes.  Pertinent labs & imaging results that were available during my care of the patient were reviewed by me and considered in my medical decision making (see chart for details).    Makayla Vasquez is a 37 y.o. female who presents to ED for cough, congestion, abdominal pain, nausea and fatigue.  Where she was diagnosed with pneumonia and started on orthotics.  She reports feeling improved, but shortly after antibiotic completion, symptoms returned.  She was seen by PCP today who noticed hypotension and sent to ER for further workup.  Upon initial arrival, patient was hypotensive and tachycardic, however by the time she was placed in room and evaluated, blood pressure improved.  She was given 500 cc fluid and heart rate now in the 80s.  She is afebrile with normal white count.  Given multiple comorbidities, CT was obtained to assess for dissection/PE and further characterization of lungs.  CT without any acute findings.  Influenza test positive.  Was started on Tamiflu.  Patient reevaluated and hemodynamically stable.  She is ambulatory on her home oxygen requirement.  Mom feels she is safe for discharge, but spoke with patient at length about return precautions and low threshold for returning, especially if she is having to increase her oxygen requirement.  She has follow-up with pulmonology in 2 days and was encouraged to keep this appointment.  Again discussed return precautions and all questions were answered.  Patient discussed with Dr. Blinda Leatherwood who agrees with treatment plan.  Final Clinical Impressions(s) / ED  Diagnoses   Final diagnoses:  Influenza A    ED Discharge Orders        Ordered    oseltamivir (TAMIFLU) 75 MG capsule  Every 12 hours     12/15/17 0419       Asya Derryberry, Chase Picket, PA-C 12/15/17 1540    Gilda Crease, MD 12/15/17 [phone]

## 2017-12-17 ENCOUNTER — Encounter: Payer: Self-pay | Admitting: Adult Health

## 2017-12-17 ENCOUNTER — Ambulatory Visit (INDEPENDENT_AMBULATORY_CARE_PROVIDER_SITE_OTHER): Payer: Medicare Other | Admitting: Adult Health

## 2017-12-17 DIAGNOSIS — J9611 Chronic respiratory failure with hypoxia: Secondary | ICD-10-CM

## 2017-12-17 DIAGNOSIS — J111 Influenza due to unidentified influenza virus with other respiratory manifestations: Secondary | ICD-10-CM | POA: Diagnosis not present

## 2017-12-17 DIAGNOSIS — J849 Interstitial pulmonary disease, unspecified: Secondary | ICD-10-CM

## 2017-12-17 DIAGNOSIS — J181 Lobar pneumonia, unspecified organism: Secondary | ICD-10-CM

## 2017-12-17 DIAGNOSIS — J189 Pneumonia, unspecified organism: Secondary | ICD-10-CM

## 2017-12-17 MED ORDER — LEVALBUTEROL HCL 0.63 MG/3ML IN NEBU
0.6300 mg | INHALATION_SOLUTION | Freq: Once | RESPIRATORY_TRACT | Status: AC
  Administered 2017-12-17: 0.63 mg via RESPIRATORY_TRACT

## 2017-12-17 NOTE — Progress Notes (Signed)
@Patient  ID: Makayla Vasquez, female    DOB: 1981/04/15, 37 y.o.   MRN: [id number]  Chief Complaint  Patient presents with  . 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   TEST  CT chest 12/2016 >IMPRESSION: 1. Pulmonary parenchymal pattern of reticulonodular coarsening and basilar predominant ground-glass is stable from 12/10/2015, 6 mm LLL nodule Allergy profile 02/23/2014-negative with total IgE 22.9 HSP panel was neg.  Positive ANA, a speckled positive ANA 1-80 pattern, mildly elevated ESR, non-reactive HIV, and negative PPD EF (25-30%, from baseline 30-25% in 2013). 2015- CXR-bilateral progressive lower lobe peribronchial infiltrates with groundglass changes and nodular pattern consistent with acute pneumonitis of unclear etiology Walk test on room air: 44/22/16: 98%, 92%, 91%, 90% at rest on room air. CT chest 06/28/2015-Severe bilaterally symmetric ground-glass consolidation.  12/17/2017 Follow up : PNA , Flu and ILD, O2 RF  Patient presents for a 2-week follow-up she was seen last visit after an emergency room visit for pneumonia.  She was started on Augmentin and doxycycline patient says she was starting to feel better with decreased cough and congestion.  However she started to become acutely ill 4 days ago.  She went to the emergency room on February 26 and found to have influenza A.  A CT chest that showed resolved pneumonia.  Patient was started on Tamiflu.  Patient says that her fever has resolved.  She continues to have cough she is currently using Mucinex and Tessalon.  She is drinking and eating.  Appetite has been down.  She denies any nausea vomiting or  diarrhea. She remains on oxygen 2 L at rest and 4 L with activity.     Allergies  Allergen Reactions  . 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 Sertraline Hcl Hives  . Tape Other (See Comments)    Burns skin    Immunization History  Administered Date(s) Administered  . Influenza Split 12/25/2011, 09/27/2012  . Influenza,inj,Quad PF,6+ Mos 10/28/2013, 08/07/2014, 09/29/2016  . Influenza-Unspecified 08/20/2015  . PPD Test 05/18/2012, 09/10/2012, 03/27/2014, 02/12/2015, 03/03/2017  . Pneumococcal Polysaccharide-23 02/16/2015  . Td 03/20/2006    Past Medical History:  Diagnosis Date  . Anxiety   . Arthritis    rheumatoid arthritis- mild, no rheumatology care   . Asthma    seasonal allergies   . Asymptomatic LV dysfunction    a. Echo in Dec 2011 with EF 35 to 40%. Felt to be due to paced rhythm. b. EF 25-30% in 07/2014.  . Bipolar affective disorder ([place])   . Cardiac pacemaker    a. Since age 44 in 34. b. Upgrade to BiV in 2013.  [name] Carpal tunnel syndrome of right wrist   . CHF (congestive heart failure) ([place])   . Congenital complete AV block   . Depression    bipolar  . Diabetes mellitus without complication ([place])   . GERD (gastroesophageal reflux disease)   . Hypertension   . Interstitial lung disease ([place])   . Lupus   . Lupus (systemic lupus erythematosus) ([place])   . Obesity   . Pneumonitis    a. a/w hypoxia - inflammatory - large workup 07/2014.  . Presence of permanent cardiac  pacemaker   . Seizures ([place])    as a child- from high fever  . Sinus tachycardia     Tobacco History: Social History   Tobacco Use  Smoking Status Former Smoker  . Packs/day: 0.25  . Years: 0.50  . Pack years: 0.12  . Types: Cigarettes  . Last attempt to quit: 07/25/1996  . Years since quitting: 21.4  Smokeless Tobacco Never Used   Counseling given: Not Answered   Outpatient Encounter Medications as of  12/17/2017  Medication Sig  . benzonatate (TESSALON) 100 MG capsule Take 1 capsule (100 mg total) by mouth every 8 (eight) hours.  . calcium-vitamin D (OSCAL WITH D) 500-200 MG-UNIT TABS tablet Take 1 tablet by mouth 2 (two) times daily with a meal.  . carvedilol (COREG) 3.125 MG tablet Take 1 tablet (3.125 mg total) by mouth 2 (two) times daily.  [name] dicyclomine (BENTYL) 20 MG tablet Take 20 mg by mouth every 6 (six) hours.  [name] EASY TOUCH PEN NEEDLES 31G X 8 MM MISC USE AS DIRECTED  . Etonogestrel (NEXPLANON [place]) Inject into the skin once.   . furosemide (LASIX) 20 MG tablet Take 1 tablet (20 mg total) by mouth daily.  [name] KLOR-CON M20 20 MEQ tablet TAKE 1 TABLET BY MOUTH EVERY DAY  . LANTUS SOLOSTAR 100 UNIT/ML Solostar Pen INJECT 15 UNITS SUBCUTANEOUSLY ONCE DAILY (Patient taking differently: INJECT 27 UNITS SUBCUTANEOUSLY ONCE DAILY)  . loratadine (CLARITIN) 10 MG tablet Take 1 tablet (10 mg total) by mouth daily.  . metFORMIN (GLUCOPHAGE) 1000 MG tablet Take 1 tablet (1,000 mg total) 2 (two) times daily with a meal by mouth.  . mycophenolate (CELLCEPT) 500 MG tablet Take 1,000 mg by mouth 2 (two) times daily.   [name] NOVOLOG FLEXPEN 100 UNIT/ML FlexPen INJECT 8 UNITS INTO THE SKIN 3 (THREE) TIMES DAILY WITH MEALS.  [name] oseltamivir (TAMIFLU) 75 MG capsule Take 1 capsule (75 mg total) by mouth every 12 (twelve) hours. You received first dose in the Emergency Department tonight.  . OXYGEN Inhale 4 L into the lungs.  . pantoprazole (PROTONIX) 40 MG tablet TAKE 1 TABLET (40MG TOTAL) BY MOUTH DAILY  . ranitidine (ZANTAC) 150 MG tablet Take 150 mg by mouth 2 (two) times daily.  . tacrolimus (PROGRAF) 0.5 MG capsule Take 1.5 mg by mouth 2 (two) times daily.  . Vitamin D, Ergocalciferol, (DRISDOL) 50000 units CAPS capsule Take 50,000 Units by mouth once a week.  . [DISCONTINUED] amoxicillin-clavulanate (AUGMENTIN) 875-125 MG tablet Take 1 tablet by mouth every 12 (twelve) hours. (Patient not taking: Reported on  12/15/2017)   No facility-administered encounter medications on file as of 12/17/2017.      Review of Systems  Constitutional:   No  weight loss, night sweats,  Fevers, chills,  +fatigue, or  lassitude.  HEENT:   No headaches,  Difficulty swallowing,  Tooth/dental problems, or  Sore throat,                No sneezing, itching, ear ache, nasal congestion, post nasal drip,   CV:  No chest pain,  Orthopnea, PND, swelling in lower extremities, anasarca, dizziness, palpitations, syncope.   GI  No heartburn, indigestion, abdominal pain, nausea, vomiting, diarrhea, change in bowel habits, loss of appetite, bloody stools.   Resp: .  No chest wall deformity  Skin: no rash or lesions.  GU: no dysuria, change in color of urine, no urgency or frequency.  No flank pain, no hematuria   MS:  No  joint pain or swelling.  No decreased range of motion.  No back pain.    Physical Exam  BP 130/80 (BP Location: Right Arm, Cuff Size: Normal)   Pulse 86   Temp 98.1 F (36.7 C) (Oral)   Ht 5' 5"  (1.651 m)   Wt 149 lb 3.2 oz (67.7 kg)   SpO2 90%   BMI 24.83 kg/m   GEN: A/Ox3; pleasant , NAD, chronically ill-appearing on oxygen   HEENT:  [place]/AT,  EACs-clear, TMs-wnl, NOSE-clear, THROAT-clear, no lesions, no postnasal drip or exudate noted.   NECK:  Supple w/ fair ROM; no JVD; normal carotid impulses w/o bruits; no thyromegaly or nodules palpated; no lymphadenopathy.    RESP bibasilar crackles . no accessory muscle use, no dullness to percussion  CARD:  RRR, no m/r/g, no peripheral edema, pulses intact, no cyanosis or clubbing.  GI:   Soft & nt; nml bowel sounds; no organomegaly or masses detected.   Musco: Warm bil, no deformities or joint swelling noted.   Neuro: alert, no focal deficits noted.    Skin: Warm, no lesions or rashes    Lab Results:  CBC    Component Value Date/Time   WBC 7.3 12/14/2017 1936   RBC 4.46 12/14/2017 1936   HGB 11.3 (L) 12/14/2017 1936   HGB 11.8  07/15/2017 1522   HCT 36.9 12/14/2017 1936   HCT 35.4 07/15/2017 1522   PLT 315 12/14/2017 1936   PLT 369 07/15/2017 1522   MCV 82.7 12/14/2017 1936   MCV 77 (L) 07/15/2017 1522   MCH 25.3 (L) 12/14/2017 1936   MCHC 30.6 12/14/2017 1936   RDW 15.7 (H) 12/14/2017 1936   RDW 16.1 (H) 07/15/2017 1522   LYMPHSABS 1.3 12/01/2017 1036   LYMPHSABS 1.3 07/15/2017 1522   MONOABS 0.5 12/01/2017 1036   EOSABS 0.3 12/01/2017 1036   EOSABS 0.1 07/15/2017 1522   BASOSABS 0.0 12/01/2017 1036   BASOSABS 0.0 07/15/2017 1522    BMET    Component Value Date/Time   NA 134 (L) 12/14/2017 1936   NA 138 07/15/2017 1522   K 3.5 12/14/2017 1936   CL 102 12/14/2017 1936   CO2 19 (L) 12/14/2017 1936   GLUCOSE 234 (H) 12/14/2017 1936   BUN 11 12/14/2017 1936   BUN 9 07/15/2017 1522   CREATININE 0.77 12/14/2017 1936   CREATININE 0.42 (L) 11/11/2016 1056   CALCIUM 8.5 (L) 12/14/2017 1936   GFRNONAA >60 12/14/2017 1936   GFRNONAA >89 11/11/2016 1056   GFRAA >60 12/14/2017 1936   GFRAA >89 11/11/2016 1056    BNP    Component Value Date/Time   BNP 27.4 12/01/2017 1036    ProBNP    Component Value Date/Time   PROBNP 11.0 07/06/2014 1431    Imaging: Dg Chest 2 View  Result Date: 12/01/2017 CLINICAL DATA:  Shortness of breath and chest pain EXAM: CHEST  2 VIEW COMPARISON:  10/01/2017 FINDINGS: Cardiac shadow is stable. Pacing device is again noted. Patchy fibrotic changes are again identified bilaterally stable from the previous study. No new infiltrate or effusion is seen. No bony abnormality is noted. IMPRESSION: Chronic changes without acute abnormality. Electronically Signed   By: Inez Catalina M.D.   On: 12/01/2017 11:09   Ct Angio Chest Pe W And/or Wo Contrast  Result Date: 12/01/2017 CLINICAL DATA:  Shortness of Breath EXAM: CT ANGIOGRAPHY CHEST WITH CONTRAST TECHNIQUE: Multidetector CT imaging of the chest was performed using the standard protocol during bolus administration of  intravenous  contrast. Multiplanar CT image reconstructions and MIPs were obtained to evaluate the vascular anatomy. CONTRAST:  80 mL ISOVUE-370 IOPAMIDOL (ISOVUE-370) INJECTION 76% COMPARISON:  Chest CT January 05, 2017 and chest radiograph December 01, 2017 and Feb 22, 2016 FINDINGS: Cardiovascular: There is no demonstrable pulmonary embolus. There is no appreciable thoracic aortic aneurysm or dissection. Pericardium is not appreciably thickened. There is no pericardial effusion. There is prominence of the main pulmonary outflow tract measuring 3.1 cm, felt to represent a degree of pulmonary arterial hypertension. Pacemaker leads are attached the right atrium, right ventricle, and coronary sinus. Mediastinum/Nodes: Visualized thyroid appears unremarkable. There is a right paratracheal lymph node measuring 1.4 x 1.3 cm. There is a right hilar lymph node measuring 1.3 x 1.3 cm. There is an aortopulmonary window lymph node measuring 1.7 x 1.3 cm. There is a left hilar measuring 1.2 x 1.3 cm. There is a subcarinal lymph node measuring 1.3 x 1.2 cm. No esophageal lesions are evident. Lungs/Pleura: As was noted previously, there is diffuse reticulonodular interstitial disease throughout the lungs diffusely with multiple ground-glass type opacities, primarily in the lingula and lower lobes bilaterally. There is more confluent opacity in the posterior segment of the right upper lobe. A focal nodular opacity in the posterior segment of the left lower lobe measuring 6 x 6 mm is again noted and is stable, seen on axial slice 91 series 8. There is no appreciable pleural effusion. Upper Abdomen: There is reflux of contrast into the inferior vena cava and hepatic veins. Visualized upper abdominal structures otherwise appear unremarkable. Musculoskeletal: No blastic or lytic bone lesions are evident. Review of the MIP images confirms the above findings. IMPRESSION: 1. No demonstrable pulmonary embolus. No thoracic aortic aneurysm or  dissection. 2. There remains widespread reticulonodular interstitial disease with areas of ground-glass opacity, primarily in the lower lobes and lingular regions. These changes are similar compared to prior studies. Differential considerations include chronic post infectious/postinflammatory scarring, chronic allergic type reaction, a degree of chronic congestive heart failure with associated chronic inflammatory type change in the lungs, and sarcoidosis. 3. Stable 6 mm nodular opacity left lower lobe. This lesion has remained stable since 2017 is felt to have benign etiology. 4. Focal airspace consolidation posterior segment right upper lobe. Acute focus of pneumonia questioned. 5. Areas of adenopathy which could well have reactive etiology given the parenchymal lung changes. Adenopathy secondary to sarcoidosis is a consideration. 6. Prominence of the main pulmonary outflow tract, felt to represent a degree of pulmonary arterial hypertension. 7. Reflux of contrast into the inferior vena cava and hepatic veins, a finding felt to be indicative of increase in right heart pressure. Electronically Signed   By: Lowella Grip III M.D.   On: 12/01/2017 12:24   Ct Angio Chest/abd/pel For Dissection W And/or Wo Contrast  Result Date: 12/15/2017 CLINICAL DATA:  37 y/o F; hypotension, generalized fatigue, nausea, vomiting, and abdominal pain for 1 week. History of pneumonitis, lupus, interstitial lung disease, hypertension, GERD, diabetes, av block, CHF, asthma, cardiac catheterization, cholecystectomy, C-section, and pacemaker. EXAM: CT ANGIOGRAPHY CHEST, ABDOMEN AND PELVIS TECHNIQUE: Multidetector CT imaging through the chest, abdomen and pelvis was performed using the standard protocol during bolus administration of intravenous contrast. Multiplanar reconstructed images and MIPs were obtained and reviewed to evaluate the vascular anatomy. CONTRAST:  173m ISOVUE-370 IOPAMIDOL (ISOVUE-370) INJECTION 76% COMPARISON:   12/01/2017 CT chest.  01/04/2017 CT abdomen and pelvis. FINDINGS: CTA CHEST FINDINGS Cardiovascular: Preferential opacification of the thoracic aorta. No evidence of thoracic  aortic aneurysm or dissection. Main pulmonary artery measures 3.5 cm. 3 lead pacemaker noted. Mild cardiomegaly. No pericardial effusion. Satisfactory opacification of the pulmonary arteries. No pulmonary embolus identified. Mediastinum/Nodes: Stable paratracheal, hilar, AP window lymphadenopathy measuring up to 13 x 17 mm in AP window (series 7, image 47). Lungs/Pleura: Stable diffuse reticulonodular interstitial disease with ground-glass opacities greatest in the lower lobes in the lingula is similar to prior studies. Interval resolution of right upper lobe posterior segment consolidation. No new airspace opacity identified. Stable subcentimeter nodule within the posterior basal segment of left lower lobe (series 8, image 103). No pleural effusion or pneumothorax. Musculoskeletal: No acute fracture. Review of the MIP images confirms the above findings. CTA ABDOMEN AND PELVIS FINDINGS VASCULAR Aorta: Normal caliber aorta without aneurysm, dissection, vasculitis or significant stenosis. Celiac: Patent without evidence of aneurysm, dissection, vasculitis or significant stenosis. SMA: Patent without evidence of aneurysm, dissection, vasculitis or significant stenosis. Renals: Both renal arteries are patent without evidence of aneurysm, dissection, vasculitis, fibromuscular dysplasia or significant stenosis. IMA: Patent without evidence of aneurysm, dissection, vasculitis or significant stenosis. Inflow: Patent without evidence of aneurysm, dissection, vasculitis or significant stenosis. Veins: No obvious venous abnormality within the limitations of this arterial phase study. Review of the MIP images confirms the above findings. NON-VASCULAR Hepatobiliary: Focal fatty deposition along falciform ligament is stable. Cholecystectomy. No significant  biliary ductal dilatation. Pancreas: Unremarkable. No pancreatic ductal dilatation or surrounding inflammatory changes. Spleen: Normal in size without focal abnormality. Adrenals/Urinary Tract: Adrenal glands are unremarkable. Kidneys are normal, without renal calculi, focal lesion, or hydronephrosis. Bladder is unremarkable. Stomach/Bowel: Stomach is within normal limits. Appendix appears normal. No evidence of bowel wall thickening, distention, or inflammatory changes. Lymphatic: No significant vascular findings are present. No enlarged abdominal or pelvic lymph nodes. Reproductive: Uterus and bilateral adnexa are unremarkable. Other: No abdominal wall hernia or abnormality. No abdominopelvic ascites. Musculoskeletal: Sacralization of L5 vertebral body. Faint sclerosis and femoral heads may represent mild avascular necrosis, no subchondral fracture or collapse. T10 vertebral body segmentation anomaly is stable. No acute fracture. Review of the MIP images confirms the above findings. IMPRESSION: 1. No acute process identified.  No acute aortic abnormality. 2. Stable chronic reticulonodular interstitial lung disease, likely related to history of lupus. Interval resolution of right upper lobe consolidation. Stable mediastinal adenopathy, likely reactive. 3. Enlarged main pulmonary artery may represent pulmonary artery hypertension. Electronically Signed   By: Kristine Garbe M.D.   On: 12/15/2017 03:17     Assessment & Plan:   Pneumonia RUL PNA resolved after abx  CT chest shows clearance   Plan   no further abx .    Influenza Newly dx Influenza - improving on Tamiflu  O2 sats are adequate on chronic home O2 .  She is tolerating food/fluids  WOB at baseline  Will continue on OP treatment plan  xopenex neb x 1 for increased cough -suspect post viral/infectious  cough   Plan  Patient Instructions  Finish Tamiflu as directed.  Mucinex DM Twice daily  As needed  Cough/congestion    Tessalon  perles As needed  Cough .  Fluids and rest  Tylenol As needed   Continue on Oxygen 2l/m rest and 4l/m with activity .  Follow up with Dr. Annamaria Boots  In 6-8 weeks with  and As needed   Please contact office for sooner follow up if symptoms do not improve or worsen or seek emergency care        ILD (interstitial lung disease) (  [place]) Currently stable - recent tough time with PNA and Influenza.  She is managing on current regimen .  Follow up in 6 weeks and As needed    Chronic respiratory failure with hypoxia ([place]) Cont on O2      Dagny Fiorentino, NP 12/17/2017

## 2017-12-17 NOTE — Assessment & Plan Note (Signed)
Cont on O2 .  

## 2017-12-17 NOTE — Patient Instructions (Addendum)
Finish Tamiflu as directed.  Mucinex DM Twice daily  As needed  Cough/congestion  Tessalon  perles As needed  Cough .  Fluids and rest  Tylenol As needed   Continue on Oxygen 2l/m rest and 4l/m with activity .  Follow up with Dr. Maple Hudson  In 6-8 weeks with  and As needed   Please contact office for sooner follow up if symptoms do not improve or worsen or seek emergency care

## 2017-12-17 NOTE — Assessment & Plan Note (Addendum)
Newly dx Influenza - improving on Tamiflu  O2 sats are adequate on chronic home O2 .  She is tolerating food/fluids  WOB at baseline  Will continue on OP treatment plan  xopenex neb x 1 for increased cough -suspect post viral/infectious  cough   Plan  Patient Instructions  Finish Tamiflu as directed.  Mucinex DM Twice daily  As needed  Cough/congestion  Tessalon  perles As needed  Cough .  Fluids and rest  Tylenol As needed   Continue on Oxygen 2l/m rest and 4l/m with activity .  Follow up with Dr. Maple Hudson  In 6-8 weeks with  and As needed   Please contact office for sooner follow up if symptoms do not improve or worsen or seek emergency care

## 2017-12-17 NOTE — Assessment & Plan Note (Signed)
RUL PNA resolved after abx  CT chest shows clearance   Plan   no further abx .

## 2017-12-17 NOTE — Assessment & Plan Note (Signed)
Currently stable - recent tough time with PNA and Influenza.  She is managing on current regimen .  Follow up in 6 weeks and As needed

## 2017-12-17 NOTE — Addendum Note (Signed)
Addended by: Boone Master E on: 12/17/2017 12:58 PM   Modules accepted: Orders

## 2018-01-01 IMAGING — DX DG CHEST 2V
2 series · 2 of 2 positions shown · non-contrast
Comparison: Most recent chest imaging radiographs 10/05/2015, CT
07/08/2015 reviewed.

CLINICAL DATA: Shortness of breath, cough, chest congestion and
CHF. Initial encounter.

EXAM:
CHEST  2 VIEW

[chest lat]
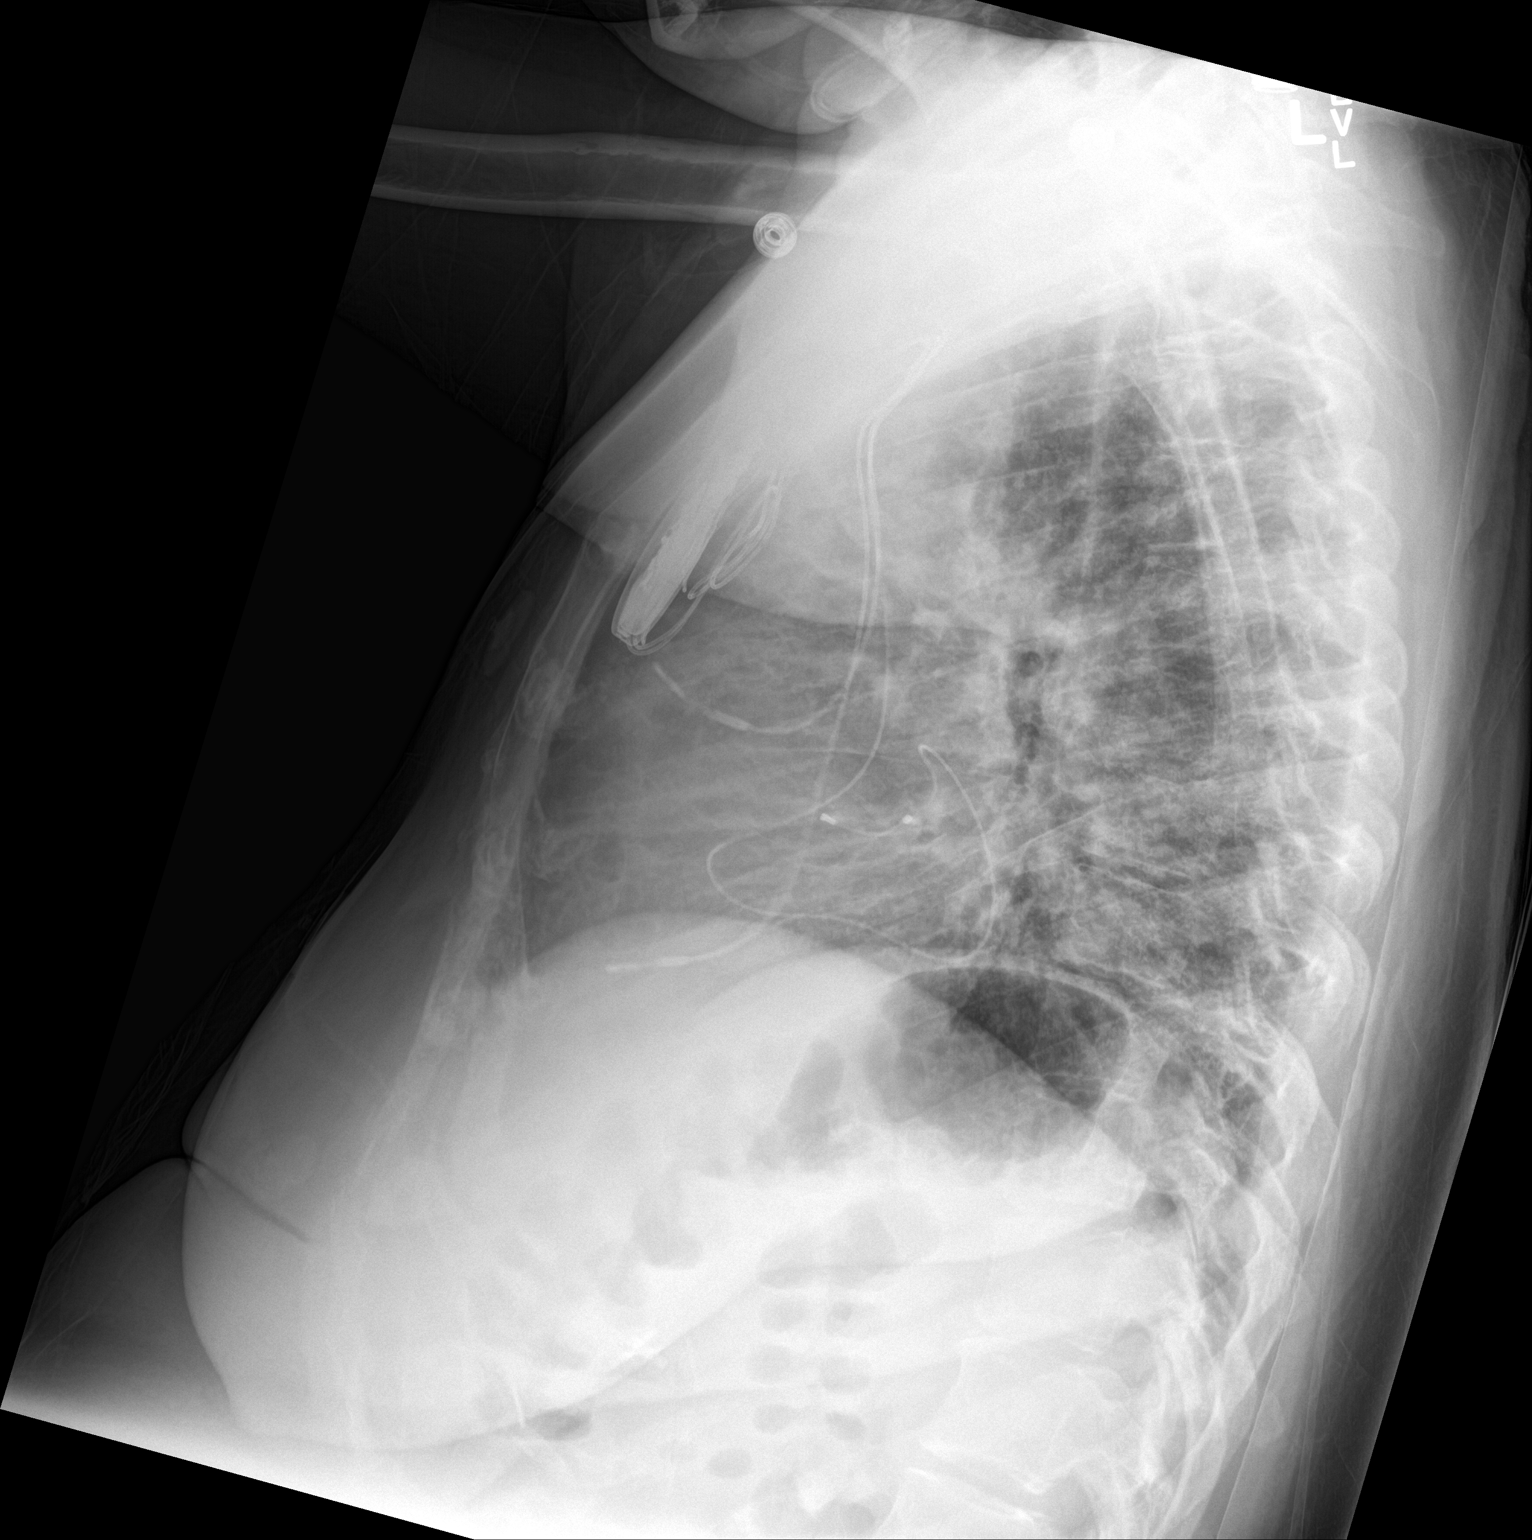

[chest ap]
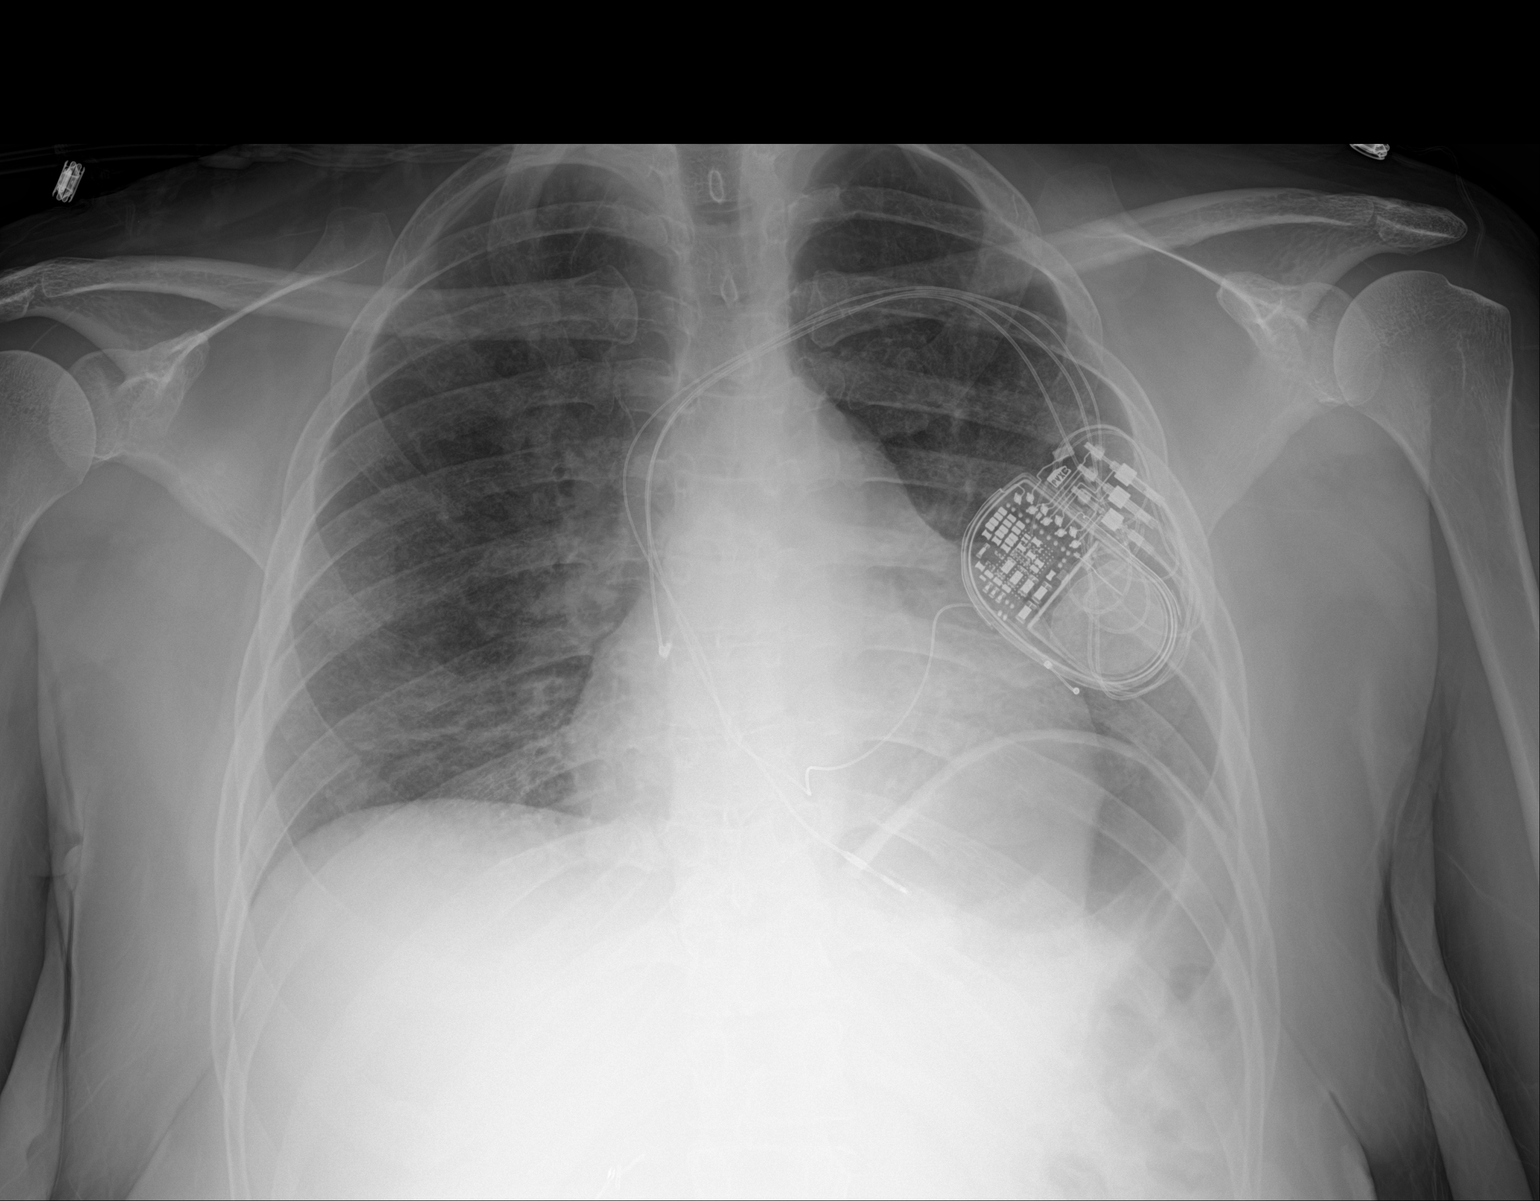

[2 of 2 positions shown; findings below may reference images not displayed]

FINDINGS: Left-sided pacemaker remains in place. Cardiomegaly, with mild
improvement from prior. Persistent but improved central pulmonary
edema. No consolidation to suggest pneumonia. No pleural effusion or
pneumothorax. Osseous structures are unchanged.
IMPRESSION: Mild CHF which appears chronic, with improvement compared to prior
exam.

## 2018-01-06 IMAGING — CT CT ABD-PELV W/ CM
2 of 4 series · 15 of 46 positions shown, 17 images · IV contrast (100 ML OMNI 300)
Comparison: Prior CT from 02/25/2013.

CLINICAL DATA: Initial evaluation for generalized abdominal pain.

EXAM:
CT ABDOMEN AND PELVIS WITH CONTRAST
TECHNIQUE: Multidetector CT imaging of the abdomen and pelvis was performed
using the standard protocol following bolus administration of
intravenous contrast.
CONTRAST:  100mL OMNIPAQUE IOHEXOL 300 MG/ML  SOLN

[Series 2: abd/pel with · axial · 0.68mm/px · z∈[-154,+241]mm · 12 of 93 slices shown, 14 images]
[im 7/93  soft-tissue]
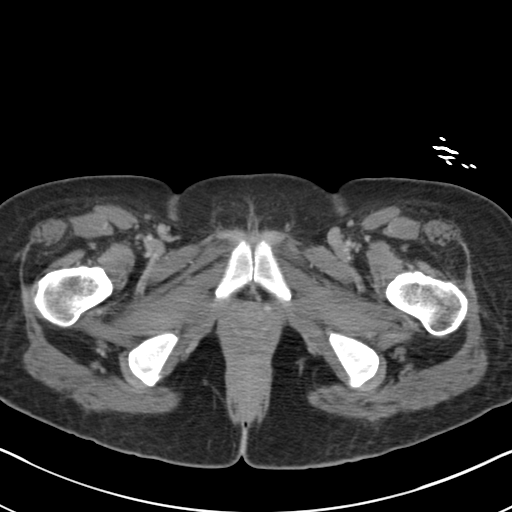
[im 7/93  bone]
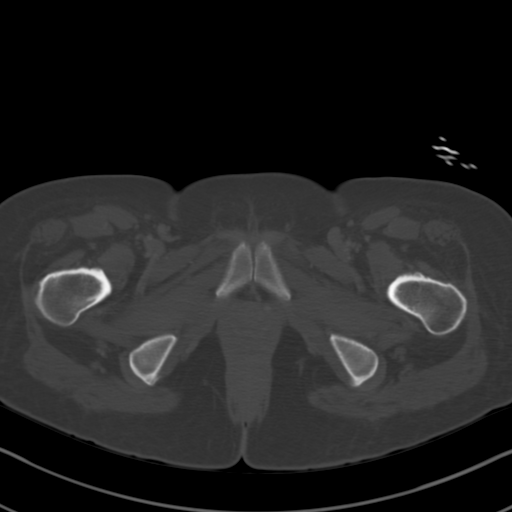
[im 14/93  soft-tissue]
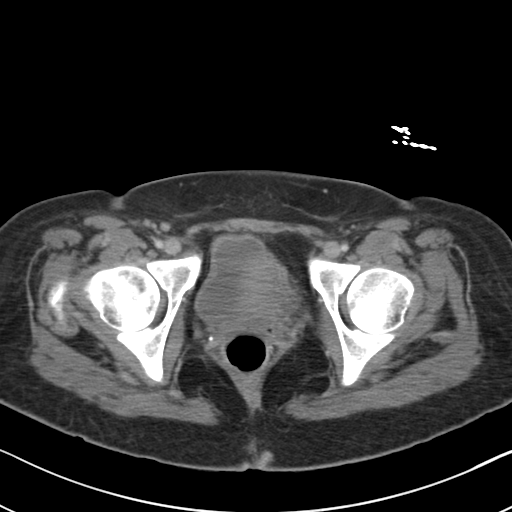
[im 21/93  soft-tissue]
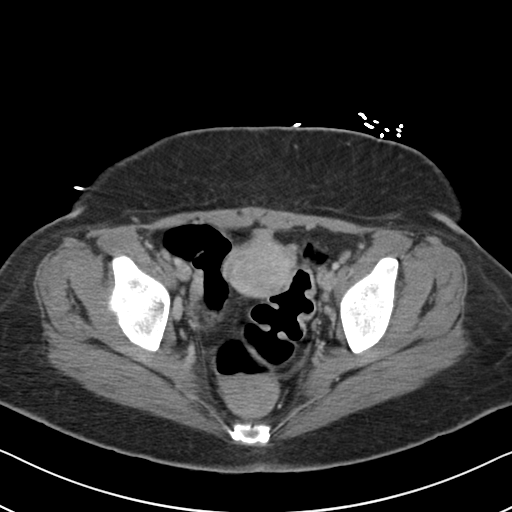
[im 28/93  soft-tissue]
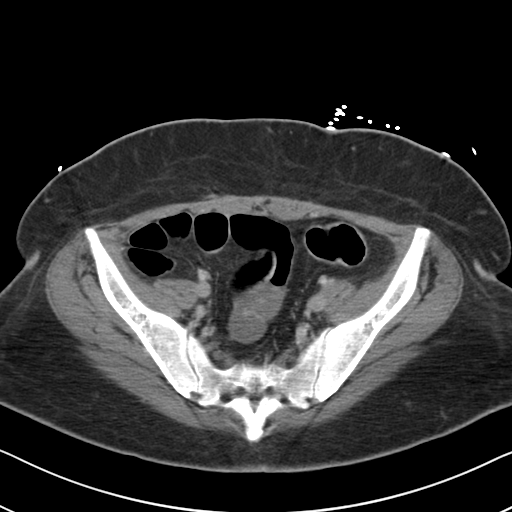
[im 35/93  soft-tissue]
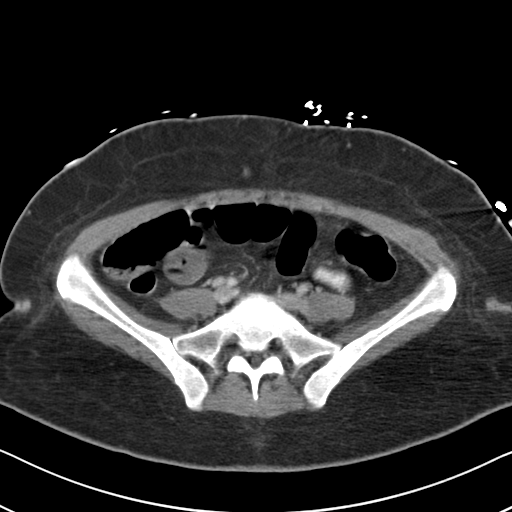
[im 41/93  soft-tissue]
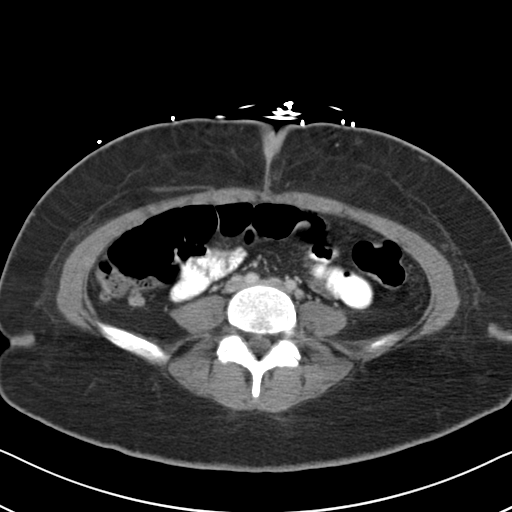
[im 52/93  soft-tissue]
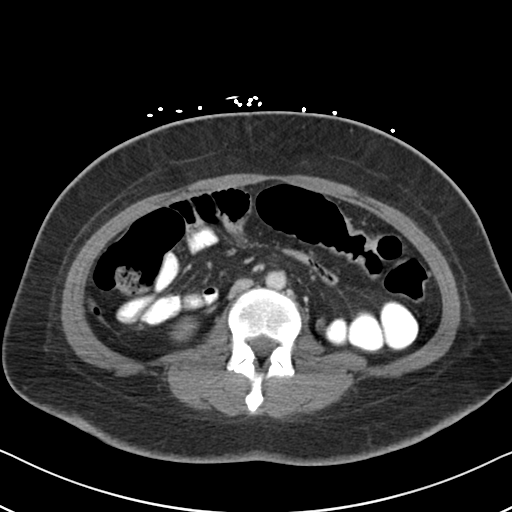
[im 58/93  soft-tissue]
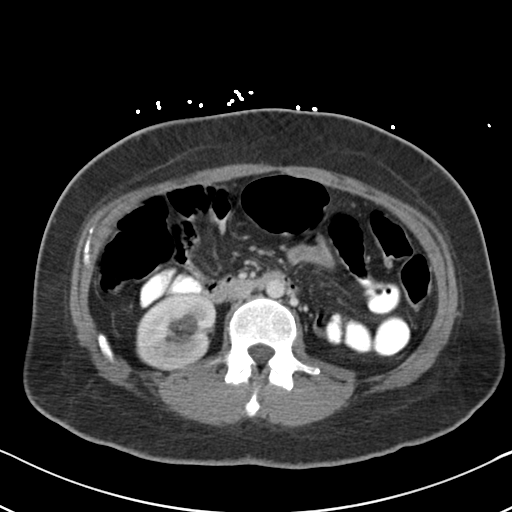
[im 65/93  soft-tissue]
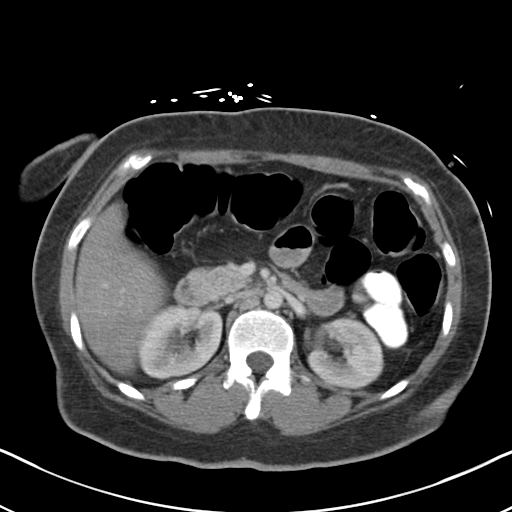
[im 65/93  bone]
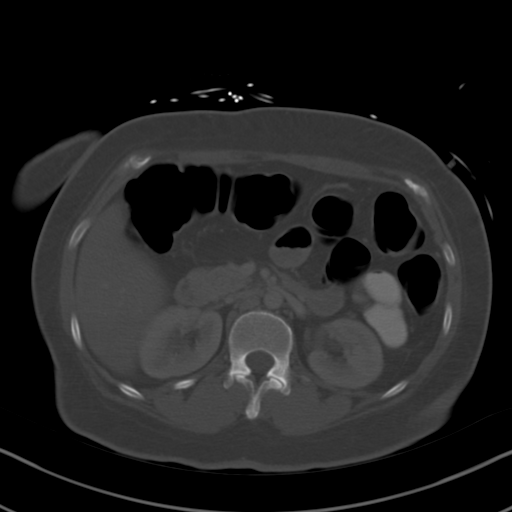
[im 72/93  soft-tissue]
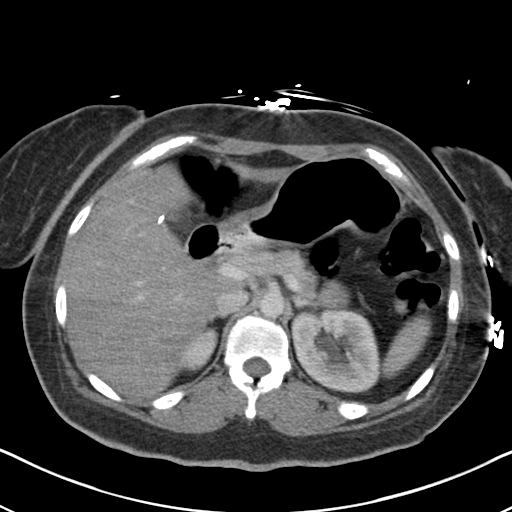
[im 79/93  soft-tissue]
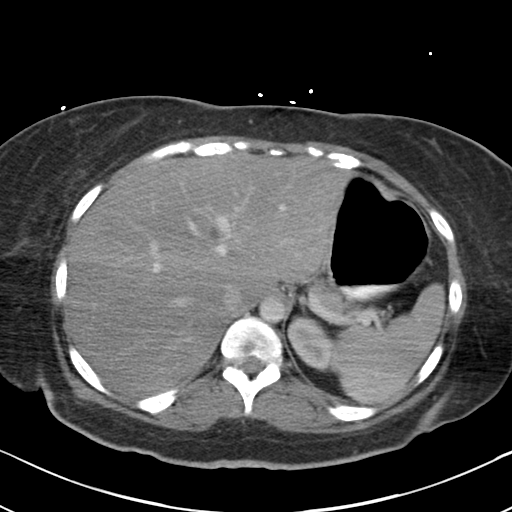
[im 86/93  soft-tissue]
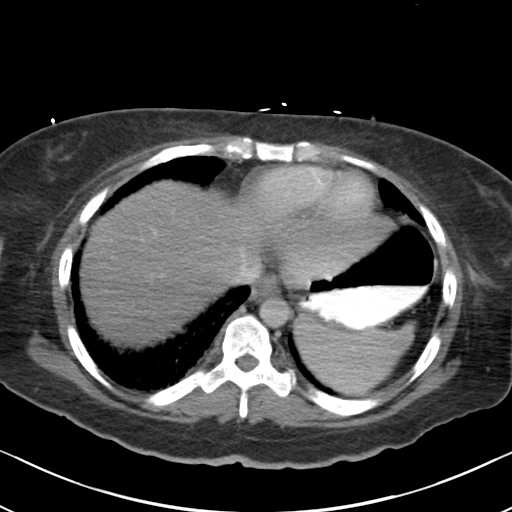

[Series 4: coronal a/|p · coronal · 0.64mm/px · 3 of 75 slices shown]
[im 25/75  soft-tissue]
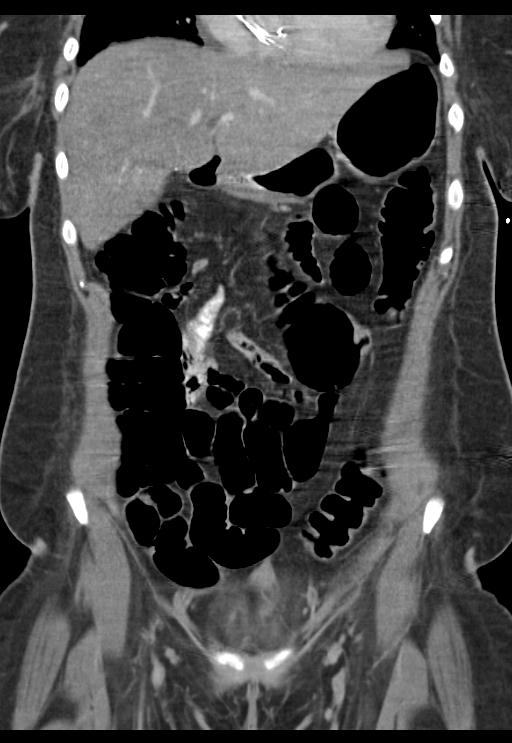
[im 33/75  soft-tissue]
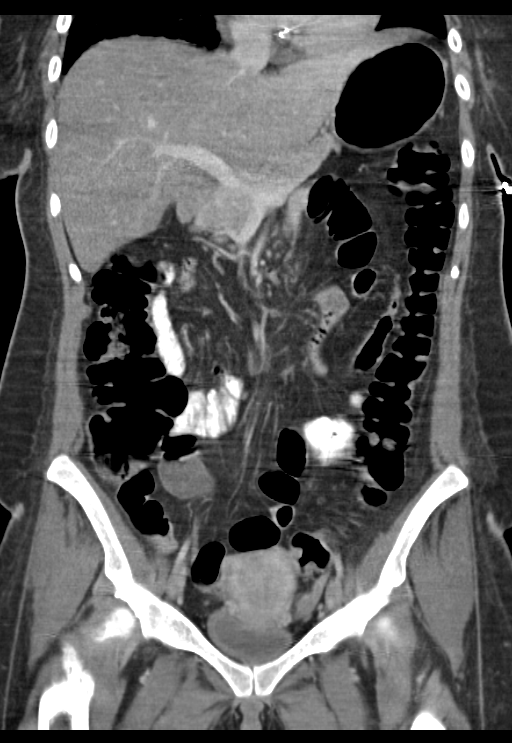
[im 42/75  soft-tissue]
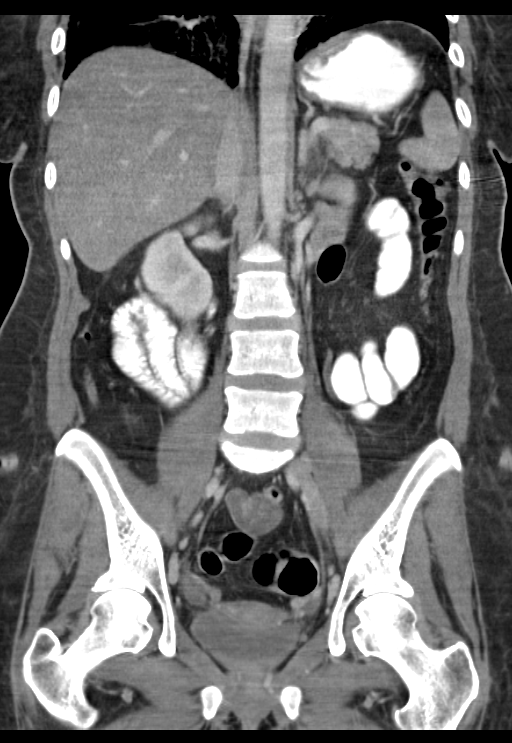

[15 of 46 positions shown; findings below may reference images not displayed]

FINDINGS: There are extensive ground-glass opacities with air bronchograms
throughout the partially visualized lungs, highly suspicious for
possible infection. A component of pulmonary edema may be present as
well, although no significant pleural effusion identified. Pacemaker
electrodes partially visualized. No pericardial effusion.

Liver demonstrates a normal contrast enhanced appearance.
Gallbladder absent. No biliary dilatation. Spleen, adrenal glands,
and pancreas demonstrate a normal contrast enhanced appearance.

Kidneys are equal in size with symmetric enhancement. No
nephrolithiasis or hydronephrosis. No focal enhancing renal mass.

Stomach within normal limits. No evidence for bowel obstruction. No
wall thickening, mucosal enhancement, or inflammatory fat stranding
seen about the bowels. Appendix well visualized in the right abdomen
and is of normal caliber and appearance without associated
inflammatory changes to suggest acute appendicitis. Fluid level
present within the lumen of the distal colon/rectum, which may
reflect active diarrheal illness.

Bladder partially distended but grossly unremarkable. Uterus within
normal limits. Ovaries normal for patient age.

No free air or fluid. No pathologically enlarged intra-abdominal
pelvic lymph nodes. Normal intravascular enhancement seen throughout
the intra-abdominal aorta and its branch vessels.

No acute osseous abnormality. No worrisome lytic or blastic osseous
lesions. Butterfly vertebra at T11 noted.
IMPRESSION: 1. Fluid level within the lumen of the distal colon, suggesting
possible active diarrheal illness. No CT evidence for other acute
intra-abdominal or pelvic process.
2. Extensive ground-glass opacities seen throughout the partially
visualized lung bases. Findings may reflect sequela of infection or
possibly edema.
3. Status post cholecystectomy.

## 2018-01-08 ENCOUNTER — Ambulatory Visit: Payer: Medicare Other | Admitting: Obstetrics & Gynecology

## 2018-01-10 IMAGING — DX DG LUMBAR SPINE COMPLETE 4+V
5 series · 5 of 5 positions shown · non-contrast
Comparison: CT, 12/14/2015

CLINICAL DATA: Lower extremity weakness for 2 months.

EXAM:
LUMBAR SPINE - COMPLETE 4+ VIEW

[l-spine ap (1 of 2)]
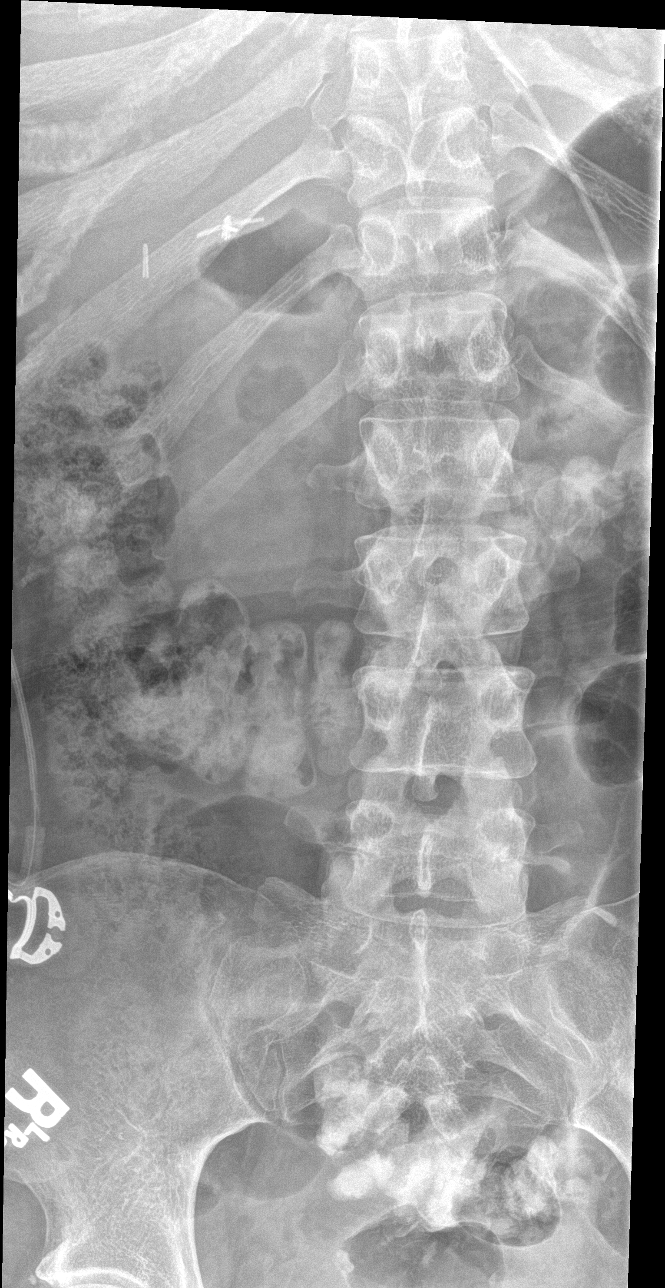

[l-spine ap (2 of 2)]
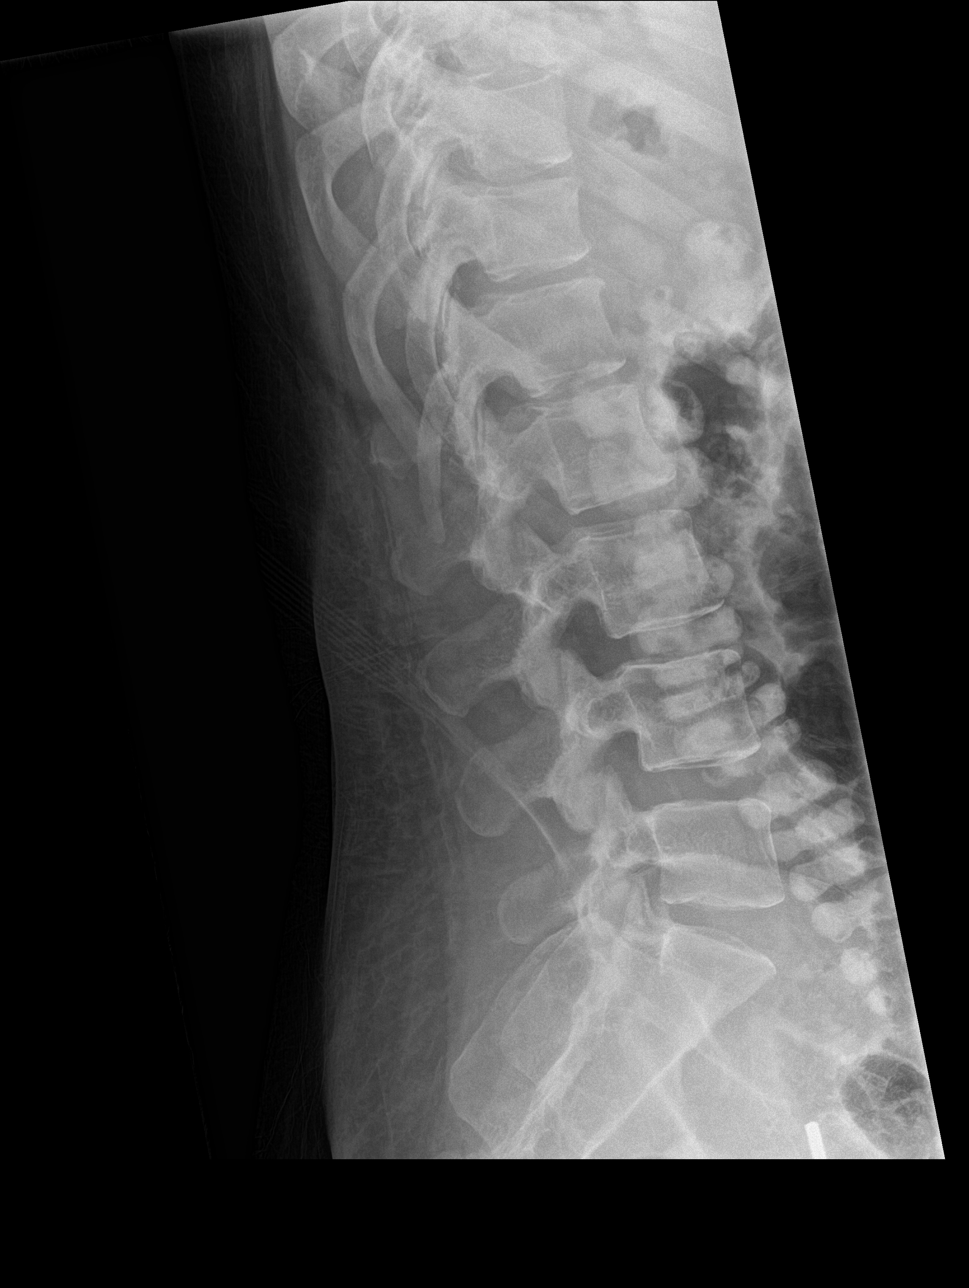

[l-spine obl (1 of 2)]
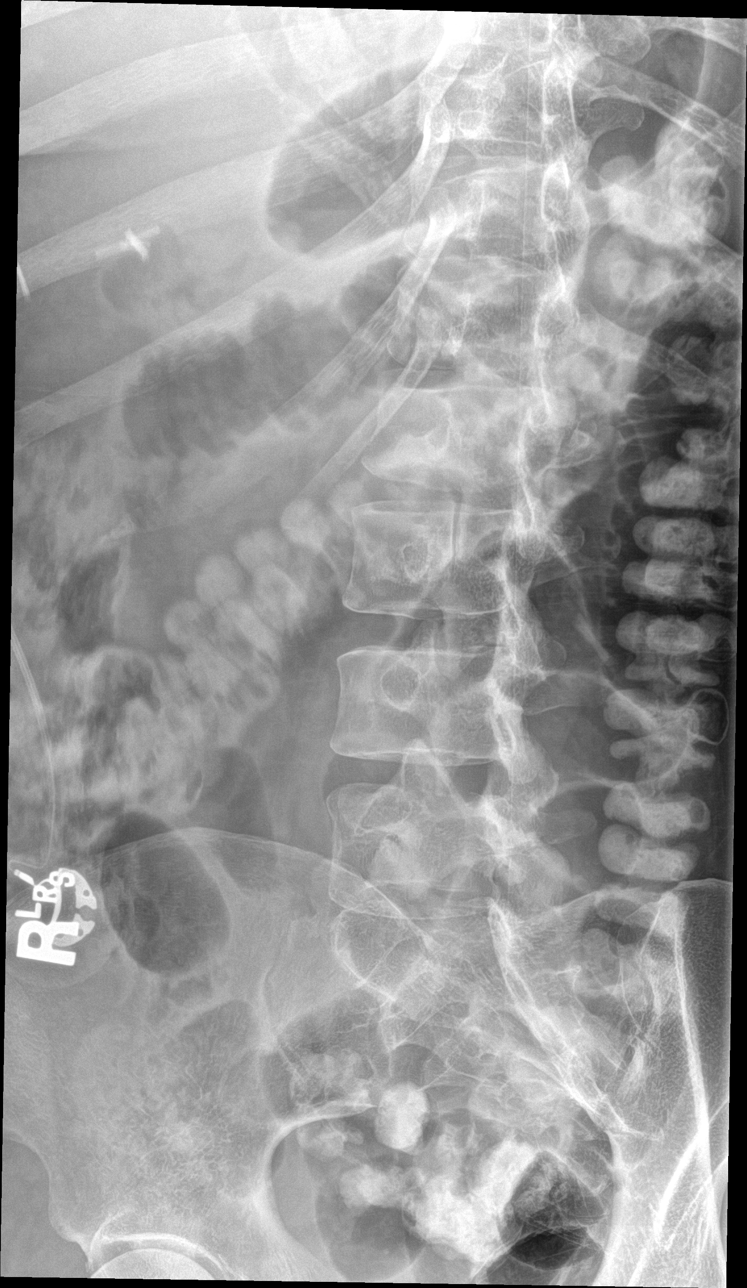

[l-spine spot]
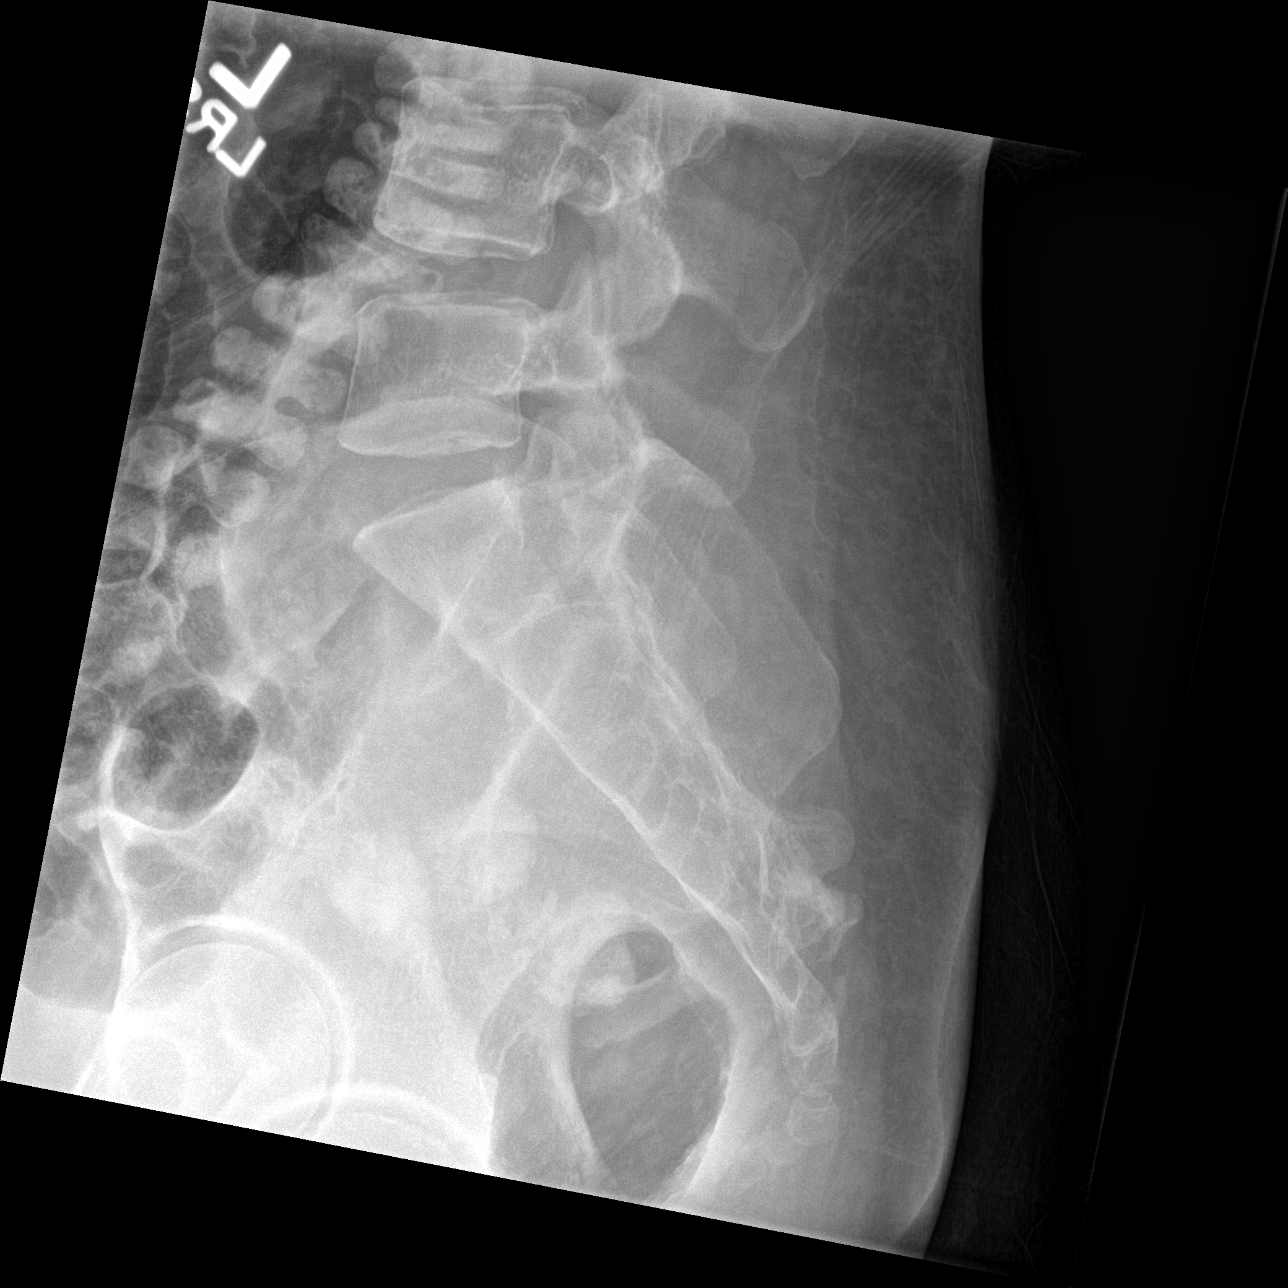

[l-spine obl (2 of 2)]
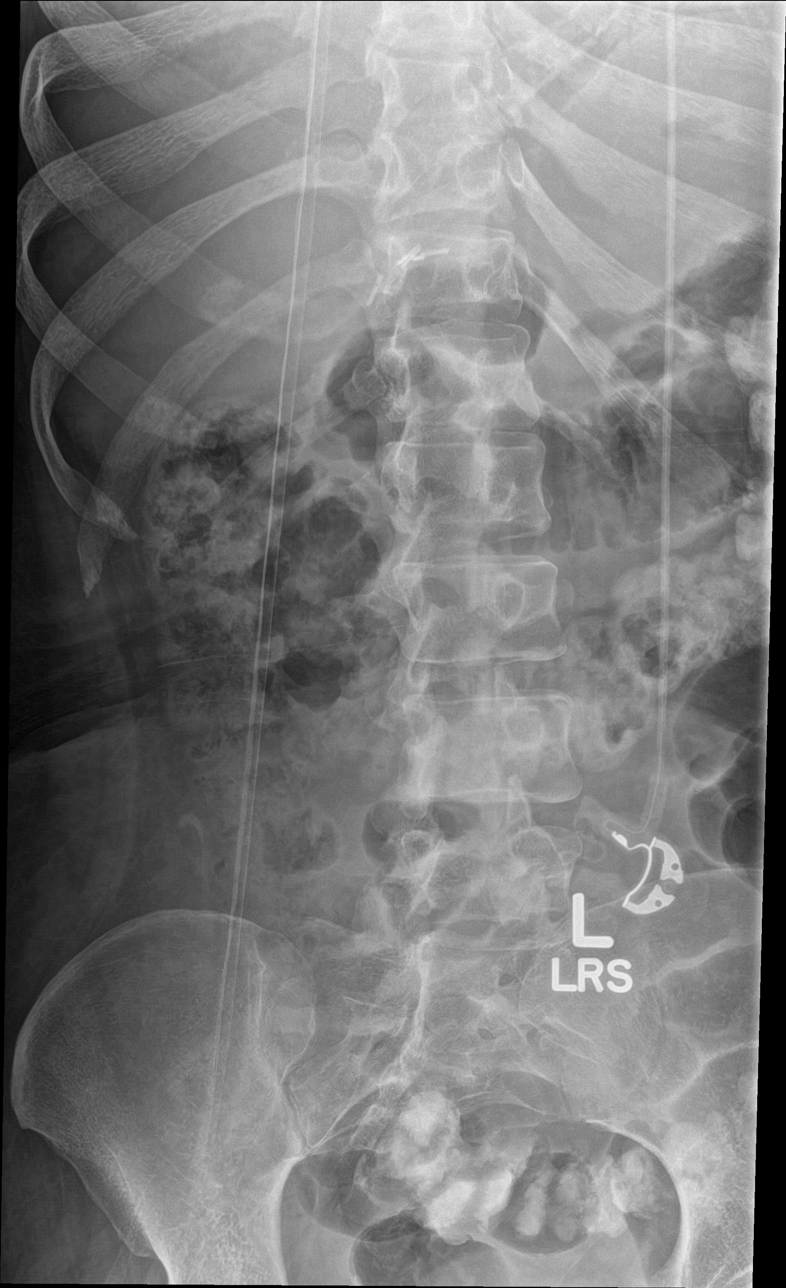

[5 of 5 positions shown; findings below may reference images not displayed]

FINDINGS: L5 is a transitional lumbosacral vertebrae. There is no fracture
spondylolisthesis. There are no bone lesions. No degenerative
changes. Mild straightening of the normal lumbar lordosis unchanged
from the CT. Soft tissues are unremarkable.
IMPRESSION: 1. No fracture or acute finding.
2. Mild straightening of the normal lumbar lordosis and transitional
lumbosacral vertebrae. Otherwise unremarkable.

## 2018-01-11 IMAGING — CR DG CHEST 2V
2 series · 2 of 2 positions shown · non-contrast
Comparison: 12/18/2015

CLINICAL DATA: Recent admit for UTI

EXAM:
CHEST  2 VIEW

[chest pa]
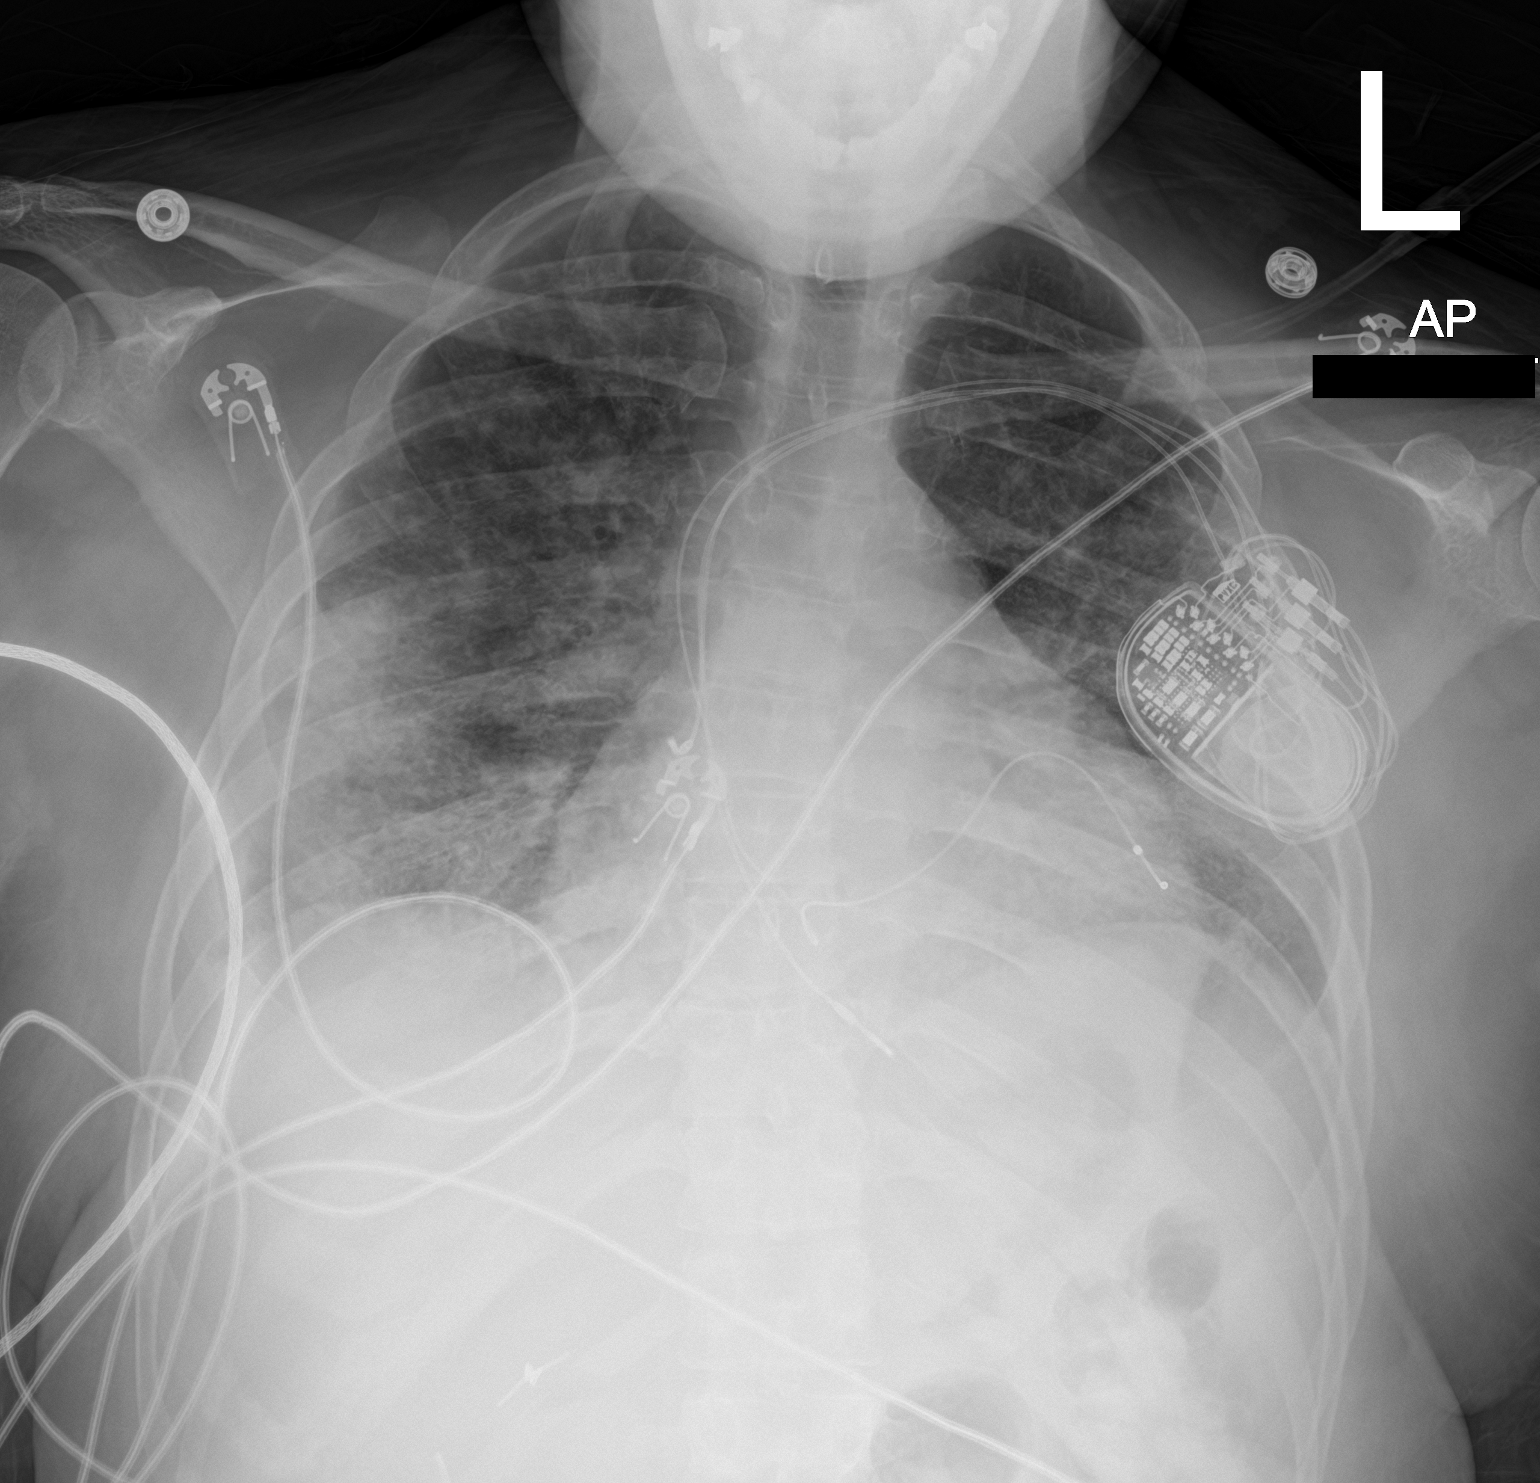

[chest lat]
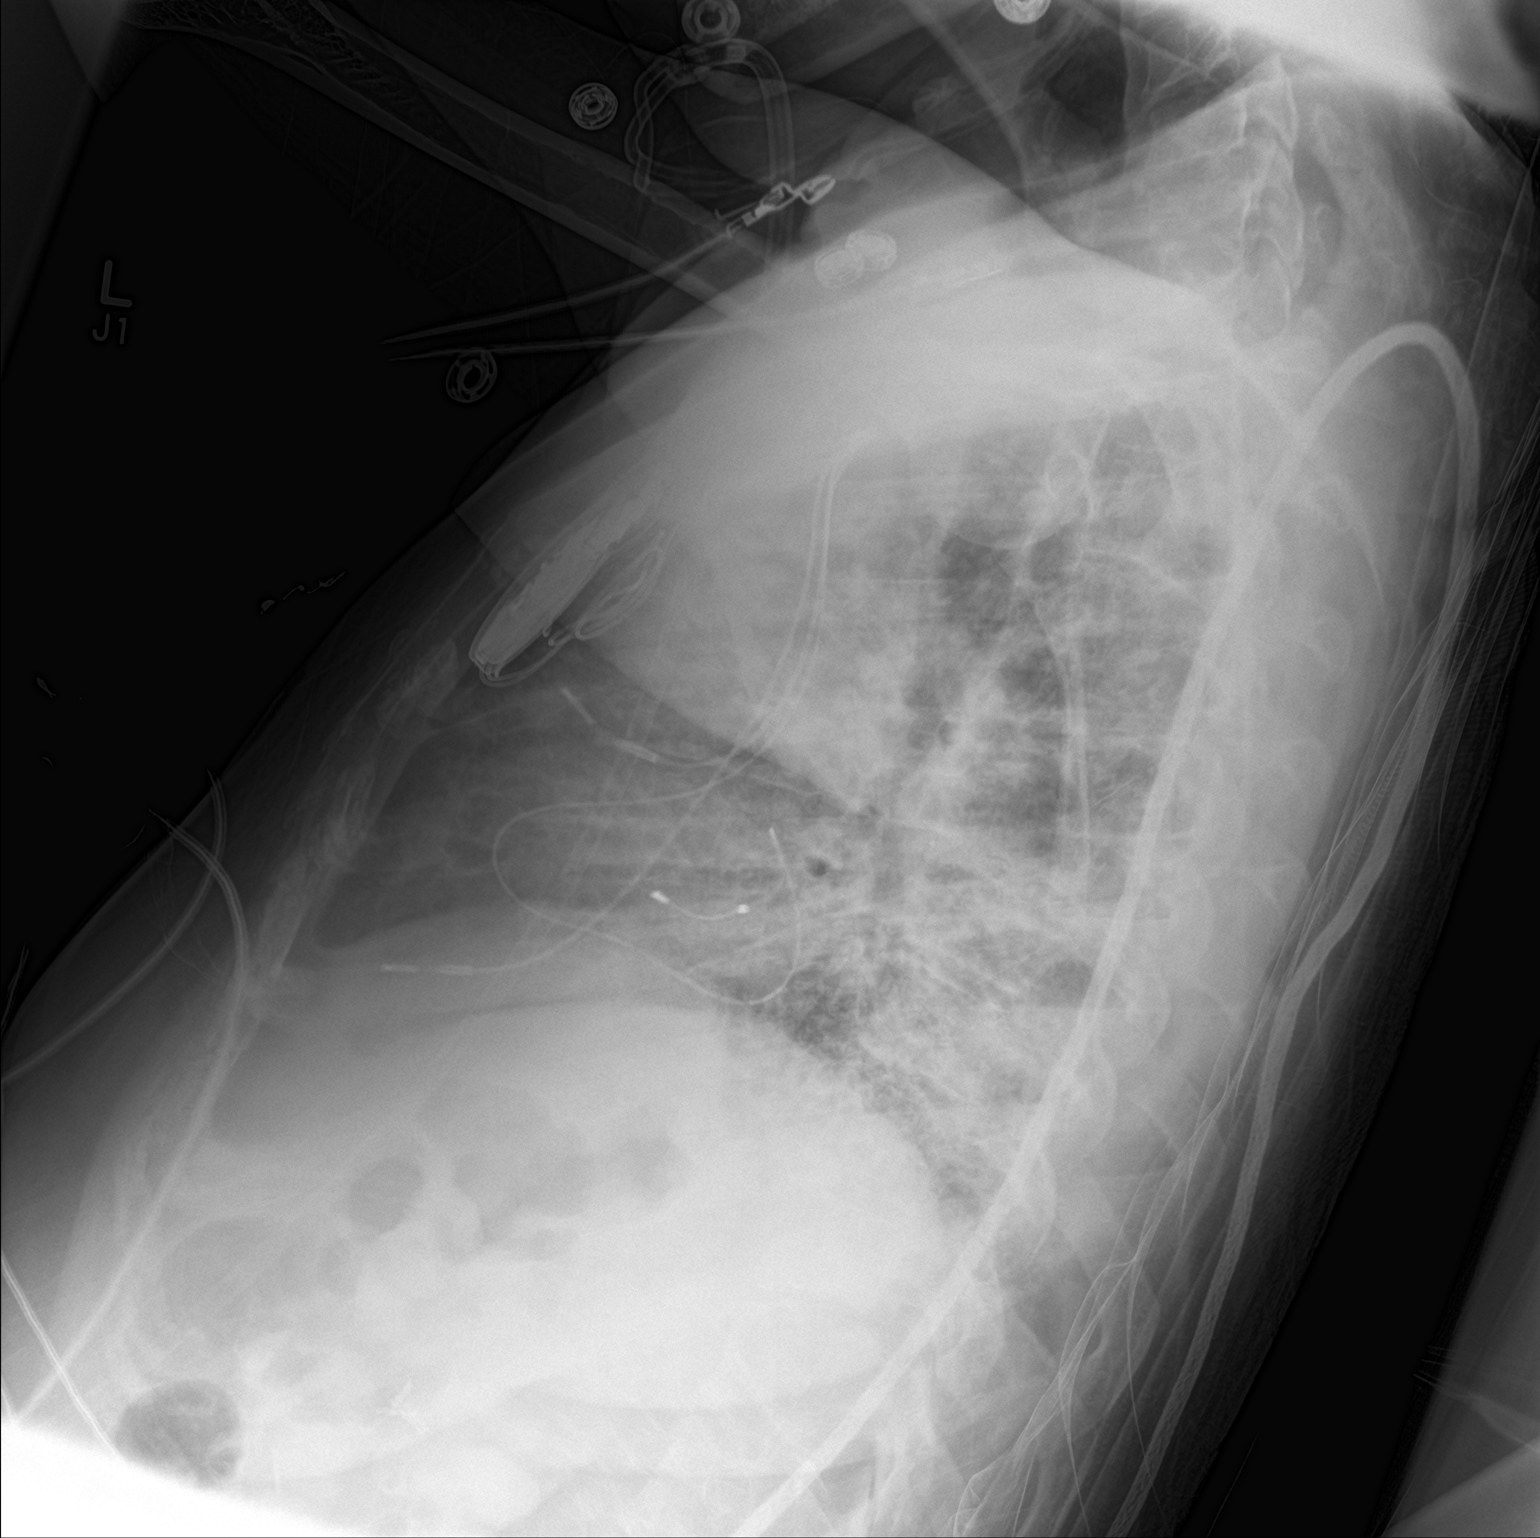

[2 of 2 positions shown; findings below may reference images not displayed]

FINDINGS: Cardiac shadow is mildly enlarged. A pacing device is again seen.
Patchy infiltrates are again noted throughout both lungs although
some mild improved aeration is seen. The infiltrative changes are
noted worst in the right lung base. No acute bony abnormality is
noted.
IMPRESSION: Slight improvement in bilateral infiltrates

## 2018-01-25 ENCOUNTER — Encounter (HOSPITAL_COMMUNITY): Payer: Medicare Other | Admitting: Cardiology

## 2018-02-01 ENCOUNTER — Other Ambulatory Visit: Payer: Self-pay | Admitting: Student in an Organized Health Care Education/Training Program

## 2018-02-01 ENCOUNTER — Encounter (HOSPITAL_COMMUNITY): Payer: Medicare Other

## 2018-02-08 ENCOUNTER — Ambulatory Visit (INDEPENDENT_AMBULATORY_CARE_PROVIDER_SITE_OTHER): Payer: Medicare Other | Admitting: Obstetrics & Gynecology

## 2018-02-08 ENCOUNTER — Encounter: Payer: Self-pay | Admitting: Obstetrics & Gynecology

## 2018-02-08 VITALS — BP 110/61 | HR 104 | Ht 65.0 in | Wt 147.5 lb

## 2018-02-08 DIAGNOSIS — N921 Excessive and frequent menstruation with irregular cycle: Secondary | ICD-10-CM | POA: Diagnosis not present

## 2018-02-08 DIAGNOSIS — E282 Polycystic ovarian syndrome: Secondary | ICD-10-CM | POA: Diagnosis not present

## 2018-02-08 DIAGNOSIS — N939 Abnormal uterine and vaginal bleeding, unspecified: Secondary | ICD-10-CM | POA: Diagnosis present

## 2018-02-08 DIAGNOSIS — Z978 Presence of other specified devices: Secondary | ICD-10-CM | POA: Diagnosis not present

## 2018-02-08 DIAGNOSIS — Z975 Presence of (intrauterine) contraceptive device: Secondary | ICD-10-CM

## 2018-02-08 MED ORDER — NAPROXEN 500 MG PO TABS: 500.0000 mg | ORAL_TABLET | Freq: Two times a day (BID) | ORAL | 2 refills | Status: DC

## 2018-02-08 NOTE — Progress Notes (Signed)
GYNECOLOGY OFFICE VISIT NOTE  History:  37 y.o. G1P1001 here today reporting spotting over the last month; Nexplanon in place since 07/2016.  Complicated medical history as below. She denies any abnormal vaginal discharge, pelvic pain or other concerns.   Past Medical History:  Diagnosis Date  . Anxiety   . Arthritis    rheumatoid arthritis- mild, no rheumatology care   . Asthma    seasonal allergies   . Asymptomatic LV dysfunction    a. Echo in Dec 2011 with EF 35 to 40%. Felt to be due to paced rhythm. b. EF 25-30% in 07/2014.  . Bipolar affective disorder (HCC)   . Cardiac pacemaker    a. Since age 33 in 68. b. Upgrade to BiV in 2013.  [name] Carpal tunnel syndrome of right wrist   . CHF (congestive heart failure) (HCC)   . Congenital complete AV block   . Depression    bipolar  . Diabetes mellitus without complication (HCC)   . GERD (gastroesophageal reflux disease)   . Hypertension   . Interstitial lung disease (HCC)   . Lupus (HCC)   . Lupus (systemic lupus erythematosus) (HCC)   . Obesity   . Pneumonitis    a. a/w hypoxia - inflammatory - large workup 07/2014.  . Presence of permanent cardiac pacemaker   . Seizures (HCC)    as a child- from high fever  . Sinus tachycardia     Past Surgical History:  Procedure Laterality Date  . ATRIAL TACH ABLATION N/A 08/14/2014   Procedure: ATRIAL TACH ABLATION;  Surgeon: Marinus Maw, MD;  Location: [facility] C[facility] LAB;  Service: Cardiovascular;  Laterality: N/A;  . BI-VENTRICULAR PACEMAKER UPGRADE N/A 03/08/2012   Procedure: BI-VENTRICULAR PACEMAKER UPGRADE;  Surgeon: Marinus Maw, MD;  Location: [facility] CATH LAB;  Service: Cardiovascular;  Laterality: N/A;  . CARPAL TUNNEL WITH CUBITAL TUNNEL Right 07/26/2013   Procedure: RIGHT LIMITED OPEN CARPAL TUNNEL RELEASE ,  RIGHT CUBITAL TUNNEL RELEASE, INSITU VERSES ULNAR NERVE DECOMPRESSION AND ANTERIOR TRANSPOSITION;  Surgeon: Dominica Severin, MD;  Location: [facility] OR;  Service: Orthopedics;   Laterality: Right;  . CESAREAN SECTION    . CHOLECYSTECTOMY    . CYST EXCISION  12/10/2012   THYROID  . INSERT / REPLACE / REMOVE PACEMAKER     2001  . IUD REMOVAL  11/03/2011   Procedure: INTRAUTERINE DEVICE (IUD) REMOVAL;  Surgeon: Myra C. Marice Potter, MD;  Location: [facility] ORS;  Service: Gynecology;  Laterality: N/A;  . NASAL FRACTURE SURGERY     /w plate   . RIGHT HEART CATHETERIZATION N/A 10/26/2014   Procedure: RIGHT HEART CATH;  Surgeon: Dolores Patty, MD;  Location: [facility] CATH LAB;  Service: Cardiovascular;  Laterality: N/A;  . THROAT SURGERY  1994   s/p laser treatment  . THYROGLOSSAL DUCT CYST N/A 12/10/2012   Procedure: REVISION OF THYROGLOSSAL DUCT CYST EXCISION;  Surgeon: Serena Colonel, MD;  Location: [facility] OR;  Service: ENT;  Laterality: N/A;  Revision of Thyroglossal Duct Cyst Excision  . VIDEO BRONCHOSCOPY Bilateral 06/19/2014   Procedure: VIDEO BRONCHOSCOPY WITHOUT FLUORO;  Surgeon: Kalman Shan, MD;  Location: [facility] ENDOSCOPY;  Service: Cardiopulmonary;  Laterality: Bilateral;    The following portions of the patient's history were reviewed and updated as appropriate: allergies, current medications, past family history, past medical history, past social history, past surgical history and problem list.   Health Maintenance:  Normal pap in 01/19/2015.   Review of Systems:  Pertinent items noted in HPI and  remainder of comprehensive ROS otherwise negative.  Objective:  Physical Exam BP 110/61   Pulse (!) 104   Ht 5\' 5"  (1.651 m)   Wt 147 lb 8 oz (66.9 kg)   BMI 24.55 kg/m  CONSTITUTIONAL: No acute distress.  CARDIOVASCULAR: Normal heart rate noted RESPIRATORY: Effort and breath sounds normal, no problems with respiration noted ABDOMEN: Soft, no distention noted.   PELVIC: Deferred MUSCULOSKELETAL: Normal range of motion. No edema noted.  Labs and Imaging No results found.  Assessment & Plan:  1. Abnormal uterine bleeding (AUB) 2. Breakthrough bleeding on Nexplanon 3.  POLYCYSTIC OVARY Likely just breakthrough bleeding on Nexplanon; but will obtain ultrasound to evaluate for any new structural anomalies.  Offered pap smear today and cultures, she declined this today. Will give Naproxen for seven days; reevaluate in 2 weeks. Will do pap smear then. Bleeding precautions reviewed.  - PELVIC COMPLETE WITH TRANSVAGINAL; Future - naproxen (NAPROSYN) 500 MG tablet; Take 1 tablet (500 mg total) by mouth 2 (two) times daily with a meal. For seven days  Dispense: 20 tablet; Refill: 2 Routine preventative health maintenance measures emphasized. Please refer to After Visit Summary for other counseling recommendations.   Return in about 2 weeks (around 02/22/2018) for Follow up ultrasound, annual pap smear.   Total face-to-face time with patient: 15 minutes.  Over 50% of encounter was spent on counseling and coordination of care.   04/24/2018, MD, FACOG Obstetrician & Gynecologist, [facility] for [facility]

## 2018-02-08 NOTE — Patient Instructions (Signed)

## 2018-02-11 ENCOUNTER — Encounter: Payer: Self-pay | Admitting: Internal Medicine

## 2018-02-11 ENCOUNTER — Other Ambulatory Visit: Payer: Self-pay | Admitting: Internal Medicine

## 2018-02-11 ENCOUNTER — Other Ambulatory Visit (HOSPITAL_COMMUNITY)
Admission: RE | Admit: 2018-02-11 | Discharge: 2018-02-11 | Disposition: A | Discharge status: Home / Self Care | Payer: Medicare Other | Source: Ambulatory Visit | Attending: Family Medicine | Admitting: Family Medicine

## 2018-02-11 ENCOUNTER — Ambulatory Visit (INDEPENDENT_AMBULATORY_CARE_PROVIDER_SITE_OTHER): Payer: Medicare Other | Admitting: Internal Medicine

## 2018-02-11 ENCOUNTER — Other Ambulatory Visit: Payer: Self-pay

## 2018-02-11 VITALS — BP 100/60 | HR 105 | Temp 98.4°F | Wt 145.0 lb

## 2018-02-11 DIAGNOSIS — N939 Abnormal uterine and vaginal bleeding, unspecified: Secondary | ICD-10-CM

## 2018-02-11 DIAGNOSIS — Z124 Encounter for screening for malignant neoplasm of cervix: Secondary | ICD-10-CM | POA: Insufficient documentation

## 2018-02-11 LAB — POCT URINE PREGNANCY: Preg Test, Ur: NEGATIVE

## 2018-02-11 LAB — POCT HEMOGLOBIN: Hemoglobin: 10.1 g/dL — AB (ref 12.2–16.2)

## 2018-02-11 NOTE — Progress Notes (Signed)
   [facility] Phone: [phone]  Subjective:  Makayla Vasquez is a 37 year old female presenting to clinic for a pap smear. She states she is also having some spotting for the last month. She has had a Nexplanon since 06/2014. The spotting is new- she has not had any issues with this previously. She is being seen by her OB/GYN for work-up of the irregular bleeding. They recommended that she have a pap smear done, but she wanted to have this done in our clinic. She does not want to discuss the irregular bleeding today and has a follow-up appointment scheduled with her OB/GYN in the next couple of weeks for this. She just wants to have the pap done today.  ROS: See HPI for pertinent positives and negatives  Past Medical History- chronic diastolic CHF, dilated cardiomyopathy, congential complete AV block with pacemaker, prolonged QT, ILD, T2DM, PCOS, SLE, bipolar disorder  Family history reviewed for today's visit. No changes.  Social history- patient is a former smoker, quit in 1997  Objective: BP 100/60   Pulse (!) 105   Temp 98.4 F (36.9 C) (Oral)   Wt 145 lb (65.8 kg)   LMP 01/18/2018   SpO2 93% Comment: 2L O2  BMI 24.13 kg/m  Gen: NAD, alert, cooperative with exam GU: external genitalia normal in appearance, no vaginal lesions, vaginal walls normal, small amount of white discharge present, cervix normal, no CMT  Assessment/Plan: Encounter for Pap: - pap performed today  - will call patient with results  Abnormal Uterine Bleeding: Having spotting on Nexplanon. Following with her OB/GYN for this and does not want to discuss this in detail today. - urine pregnancy test negative - POC Hgb 10.1  - f/u with gyn   Willadean Carol, MD PGY-3

## 2018-02-11 NOTE — Patient Instructions (Signed)
It was so nice to meet you!  We did you pap today. I will call you with those results. Please make sure you follow-up with your OB/GYN regarding your bleeding.  -Dr. Nancy Marus

## 2018-02-14 DIAGNOSIS — N939 Abnormal uterine and vaginal bleeding, unspecified: Secondary | ICD-10-CM | POA: Insufficient documentation

## 2018-02-14 NOTE — Assessment & Plan Note (Signed)
Having spotting on Nexplanon. Following with her OB/GYN for this and does not want to discuss this in detail today. - urine pregnancy test negative - POC Hgb 10.1  - f/u with gyn

## 2018-02-14 NOTE — Assessment & Plan Note (Signed)
-   pap performed today  - will call patient with results

## 2018-02-15 ENCOUNTER — Ambulatory Visit (HOSPITAL_COMMUNITY)
Admission: RE | Admit: 2018-02-15 | Discharge: 2018-02-15 | Disposition: A | Discharge status: Home / Self Care | Payer: Medicare Other | Source: Ambulatory Visit | Attending: Obstetrics & Gynecology | Admitting: Obstetrics & Gynecology

## 2018-02-15 DIAGNOSIS — E282 Polycystic ovarian syndrome: Secondary | ICD-10-CM | POA: Diagnosis not present

## 2018-02-15 DIAGNOSIS — N939 Abnormal uterine and vaginal bleeding, unspecified: Secondary | ICD-10-CM | POA: Diagnosis not present

## 2018-02-15 LAB — CYTOLOGY - PAP
ADEQUACY: ABSENT
Diagnosis: NEGATIVE
HPV: NOT DETECTED

## 2018-02-16 DIAGNOSIS — D8989 Other specified disorders involving the immune mechanism, not elsewhere classified: Secondary | ICD-10-CM | POA: Diagnosis not present

## 2018-02-16 DIAGNOSIS — R51 Headache: Secondary | ICD-10-CM | POA: Diagnosis not present

## 2018-02-16 DIAGNOSIS — I5022 Chronic systolic (congestive) heart failure: Secondary | ICD-10-CM | POA: Diagnosis not present

## 2018-02-16 DIAGNOSIS — R0981 Nasal congestion: Secondary | ICD-10-CM | POA: Diagnosis not present

## 2018-02-16 DIAGNOSIS — R000 Tachycardia, unspecified: Secondary | ICD-10-CM | POA: Diagnosis not present

## 2018-02-16 DIAGNOSIS — J849 Interstitial pulmonary disease, unspecified: Secondary | ICD-10-CM | POA: Diagnosis not present

## 2018-02-16 DIAGNOSIS — Z79899 Other long term (current) drug therapy: Secondary | ICD-10-CM | POA: Diagnosis not present

## 2018-02-16 DIAGNOSIS — Z888 Allergy status to other drugs, medicaments and biological substances status: Secondary | ICD-10-CM | POA: Diagnosis not present

## 2018-02-17 ENCOUNTER — Telehealth: Payer: Self-pay

## 2018-02-17 NOTE — Telephone Encounter (Signed)
-----   Message from Campbell Stall, MD sent at 02/17/2018  2:02 PM EDT ----- Please let Ms. Rossano know that her pap smear did not show any cervical cancer. Thanks!

## 2018-02-17 NOTE — Telephone Encounter (Signed)
Pt informed of normal pap results.  ?

## 2018-02-23 ENCOUNTER — Other Ambulatory Visit: Payer: Self-pay

## 2018-02-23 ENCOUNTER — Ambulatory Visit (HOSPITAL_COMMUNITY)
Admission: RE | Admit: 2018-02-23 | Discharge: 2018-02-23 | Disposition: A | Discharge status: Home / Self Care | Payer: Medicare Other | Source: Ambulatory Visit | Attending: Cardiology | Admitting: Cardiology

## 2018-02-23 VITALS — BP 107/71 | HR 132 | Wt 143.0 lb

## 2018-02-23 DIAGNOSIS — K219 Gastro-esophageal reflux disease without esophagitis: Secondary | ICD-10-CM | POA: Diagnosis not present

## 2018-02-23 DIAGNOSIS — M329 Systemic lupus erythematosus, unspecified: Secondary | ICD-10-CM | POA: Insufficient documentation

## 2018-02-23 DIAGNOSIS — E669 Obesity, unspecified: Secondary | ICD-10-CM | POA: Diagnosis not present

## 2018-02-23 DIAGNOSIS — Z794 Long term (current) use of insulin: Secondary | ICD-10-CM | POA: Diagnosis not present

## 2018-02-23 DIAGNOSIS — I442 Atrioventricular block, complete: Secondary | ICD-10-CM

## 2018-02-23 DIAGNOSIS — Q246 Congenital heart block: Secondary | ICD-10-CM | POA: Insufficient documentation

## 2018-02-23 DIAGNOSIS — I5022 Chronic systolic (congestive) heart failure: Secondary | ICD-10-CM | POA: Insufficient documentation

## 2018-02-23 DIAGNOSIS — Z950 Presence of cardiac pacemaker: Secondary | ICD-10-CM | POA: Diagnosis not present

## 2018-02-23 DIAGNOSIS — F419 Anxiety disorder, unspecified: Secondary | ICD-10-CM | POA: Diagnosis not present

## 2018-02-23 DIAGNOSIS — M069 Rheumatoid arthritis, unspecified: Secondary | ICD-10-CM | POA: Diagnosis not present

## 2018-02-23 DIAGNOSIS — Z7952 Long term (current) use of systemic steroids: Secondary | ICD-10-CM | POA: Insufficient documentation

## 2018-02-23 DIAGNOSIS — J849 Interstitial pulmonary disease, unspecified: Secondary | ICD-10-CM | POA: Insufficient documentation

## 2018-02-23 DIAGNOSIS — I517 Cardiomegaly: Secondary | ICD-10-CM | POA: Diagnosis not present

## 2018-02-23 DIAGNOSIS — Z87891 Personal history of nicotine dependence: Secondary | ICD-10-CM | POA: Insufficient documentation

## 2018-02-23 DIAGNOSIS — R918 Other nonspecific abnormal finding of lung field: Secondary | ICD-10-CM | POA: Insufficient documentation

## 2018-02-23 DIAGNOSIS — I110 Hypertensive heart disease with heart failure: Secondary | ICD-10-CM | POA: Insufficient documentation

## 2018-02-23 DIAGNOSIS — J9611 Chronic respiratory failure with hypoxia: Secondary | ICD-10-CM

## 2018-02-23 DIAGNOSIS — I429 Cardiomyopathy, unspecified: Secondary | ICD-10-CM | POA: Diagnosis not present

## 2018-02-23 DIAGNOSIS — E119 Type 2 diabetes mellitus without complications: Secondary | ICD-10-CM | POA: Diagnosis not present

## 2018-02-23 DIAGNOSIS — F319 Bipolar disorder, unspecified: Secondary | ICD-10-CM | POA: Diagnosis not present

## 2018-02-23 DIAGNOSIS — R0602 Shortness of breath: Secondary | ICD-10-CM | POA: Diagnosis not present

## 2018-02-23 DIAGNOSIS — J45909 Unspecified asthma, uncomplicated: Secondary | ICD-10-CM | POA: Diagnosis not present

## 2018-02-23 DIAGNOSIS — Z79899 Other long term (current) drug therapy: Secondary | ICD-10-CM | POA: Insufficient documentation

## 2018-02-23 MED ORDER — FUROSEMIDE 40 MG PO TABS: 40.0000 mg | ORAL_TABLET | Freq: Every day | ORAL | 3 refills | Status: DC

## 2018-02-23 MED ORDER — POTASSIUM CHLORIDE CRYS ER 20 MEQ PO TBCR: 40.0000 meq | EXTENDED_RELEASE_TABLET | Freq: Every day | ORAL | 3 refills | Status: DC

## 2018-02-23 NOTE — Patient Instructions (Addendum)
Increase Furosemide 40 mg (1 tab) in AM, then 20 mg (1/2 tab) in PM for 4 days  -Then 40 mg (1 tab) daily  Increase Potassium 40 meq (2 tabs) daily  Chest x-ray today.  Follow up with Dr.McLean in 4 months.

## 2018-02-24 NOTE — Progress Notes (Signed)
Patient ID: Makayla Vasquez, female   DOB: 25-Mar-1981, 37 y.o.   MRN: [id number] PCP: Dr Blythe [facility] Cardiology: Dr Shirlee Latch  HPI: Makayla Vasquez is 37 y.o. 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   She has a h/o congenital HB and underwent first PPM at age 89. In 2011 had ECHO with EF 35-40%. LV dysfunction felt to be due to RV pacing so upgraded to CRT-P. Echo in 10/15 with EF 25%.  She also has concomitant lung disease and this has limited b-blocker dosing.  She follows with Dr. Maple Hudson for her lung disease. She is a former smoker.  Admitted in 8/15 and 9/15 with worsening respiratory status and severe hypoxia. Had extensive w/u. Thought to have a viral pneumonitis complicated by atelectasis and mild edema vs. an autoimmune process. Discharged home on a course of levaquin and prednisone.   She was offered a lung biopsy for further diagnosis versus empiric treatment with steroids. She opted for empiric steroid treatment. She is followed by a rheumatologist and is on Cellcept, prednisone, and tacrolimus.  She is on oxygen and night and with activity.   Admission in 5/17 for ?sepsis and management of interstitial lung disease.     She was admitted with PNA in 1/18.  Lasix, losartan, and bisoprolol stopped.   Last echo in 1/19 showed EF 45-50%, with mild to moderately decreased RV systolic function.    She returns for followup of CHF and ILD.  She has been doing poorly recently. She has been coughing and occasionally vomiting.  She is wheezing and finds it hard to catch her breath at times.  No fever.  She saw pulmonary last week and was started on amoxicillin for sinusitis.  She has not felt much better on antibiotics.  ECG (personally reviewed): sinus tachy, BiV pacing  Medtronic device interrogation: Optivol with fluid index > threshold since 3/19, decreased impedance, minimal  activity, > 99% BiV pacing.   REDS vest reading today 46%  PFTs (12/15) showed a severe restrictive defect concerning for an interstitial process.   EP study 10/15 for persistent tachycardia and found to have sinus tachycardia.   06/16/14 CT scan showed bilateral progressive lower peribronchial infiltrates with groundglass changes and nodular patter consistent with acute pneumonitis of unclear etiology.  06/20/14 BAL showed 60% macrophages and 30% PMNs. Her AFB smear, fungal smear, legionella Ab, pneumocystis smear, ACE, and sputum culture are negative. Her AFB, legionella, fungal, and BAL culture as well as her fungal Ab, hypersensitivity pneumonitis panel were all negative Serology: DsDNA, RF, ANCA, HSP all negative  RHC 10/26/2014  RA = 2 RV = 27/1/5 PA = 29/7 (18) PCW = 4 Fick cardiac output/index = 4.5/2.2 PVR = 3.0 WU FA sat = 98% PA sat = 64%, 61%  Labs (10/15): TSH normal K 3.6, creatinine 1.1 Labs (10/31/2014) L k 3.9 Creatinine 1.06  Labs 07/23/2015: K 3.8 Creatinine 0.82  Labs (3/17): K 4, creatinine 0.6 Labs (5/17): K 4.1, creatinine 0.7 Labs (1/18): K 3.6, creatinine 0.42 Labs (2/19): K 3.5, creatinine 0.77  Review of Systems: All systems reviewed and negative except as per HPI.   Past Medical History:  Diagnosis Date  . Anxiety   . Arthritis    rheumatoid arthritis- mild, no rheumatology care   . Asthma    seasonal allergies   . Asymptomatic LV dysfunction    a. Echo in Dec 2011 with  EF 35 to 40%. Felt to be due to paced rhythm. b. EF 25-30% in 07/2014.  . Bipolar affective disorder (HCC)   . Cardiac pacemaker    a. Since age 74 in 62. b. Upgrade to BiV in 2013.  [name] Carpal tunnel syndrome of right wrist   . CHF (congestive heart failure) (HCC)   . Congenital complete AV block   . Depression    bipolar  . Diabetes mellitus without complication (HCC)   . GERD (gastroesophageal reflux disease)   . Hypertension   . Interstitial lung disease (HCC)   . Lupus  (HCC)   . Lupus (systemic lupus erythematosus) (HCC)   . Obesity   . Pneumonitis    a. a/w hypoxia - inflammatory - large workup 07/2014.  . Presence of permanent cardiac pacemaker   . Seizures (HCC)    as a child- from high fever  . Sinus tachycardia     Current Outpatient Medications  Medication Sig Dispense Refill  . benzonatate (TESSALON) 100 MG capsule Take 1 capsule (100 mg total) by mouth every 8 (eight) hours. 21 capsule 0  . calcium-vitamin D (OSCAL WITH D) 500-200 MG-UNIT TABS tablet Take 1 tablet by mouth 2 (two) times daily with a meal.  11  . carvedilol (COREG) 3.125 MG tablet Take 1 tablet (3.125 mg total) by mouth 2 (two) times daily. 60 tablet 3  . dicyclomine (BENTYL) 20 MG tablet Take 20 mg by mouth every 6 (six) hours.    [name] EASY TOUCH PEN NEEDLES 31G X 8 MM MISC USE AS DIRECTED 100 each 11  . Etonogestrel (NEXPLANON [place]) Inject into the skin once.     . furosemide (LASIX) 40 MG tablet Take 1 tablet (40 mg total) by mouth daily. 30 tablet 3  . LANTUS SOLOSTAR 100 UNIT/ML Solostar Pen INJECT 15 UNITS SUBCUTANEOUSLY ONCE DAILY (Patient taking differently: INJECT 27 UNITS SUBCUTANEOUSLY ONCE DAILY) 15 mL 3  . loratadine (CLARITIN) 10 MG tablet Take 1 tablet (10 mg total) by mouth daily. 30 tablet 11  . metFORMIN (GLUCOPHAGE) 1000 MG tablet Take 1 tablet (1,000 mg total) 2 (two) times daily with a meal by mouth. 180 tablet 3  . metFORMIN (GLUCOPHAGE) 1000 MG tablet Take 1 tablet (1,000 mg total) by mouth 2 (two) times daily with a meal. 90 tablet 3  . mycophenolate (CELLCEPT) 500 MG tablet Take 1,000 mg by mouth 2 (two) times daily.     . naproxen (NAPROSYN) 500 MG tablet Take 1 tablet (500 mg total) by mouth 2 (two) times daily with a meal. For seven days 20 tablet 2  . NOVOLOG FLEXPEN 100 UNIT/ML FlexPen INJECT 8 UNITS INTO THE SKIN 3 (THREE) TIMES DAILY WITH MEALS. 15 pen 5  . OXYGEN Inhale 4 L into the lungs.    . pantoprazole (PROTONIX) 40 MG tablet TAKE 1 TABLET (40MG   TOTAL) BY MOUTH DAILY 90 tablet 3  . potassium chloride SA (KLOR-CON M20) 20 MEQ tablet Take 2 tablets (40 mEq total) by mouth daily. 60 tablet 3  . ranitidine (ZANTAC) 150 MG tablet Take 150 mg by mouth 2 (two) times daily.    . tacrolimus (PROGRAF) 0.5 MG capsule Take 1.5 mg by mouth 2 (two) times daily.    . Vitamin D, Ergocalciferol, (DRISDOL) 50000 units CAPS capsule Take 50,000 Units by mouth once a week.  1   No current facility-administered medications for this encounter.     Allergies  Allergen Reactions  . Atorvastatin Other (See Comments)  Possible statin induced myopathy with elevated CK on atrovastatin 40  . Sertraline Hcl Hives  . Tape Other (See Comments)    Burns skin      Social History   Socioeconomic History  . Marital status: Single    Spouse name: Not on file  . Number of children: 1  . Years of education: Not on file  . Highest education level: Not on file  Occupational History  . Occupation: CNA  Social Needs  . Financial resource strain: Not on file  . Food insecurity:    Worry: Not on file    Inability: Not on file  . Transportation needs:    Medical: Not on file    Non-medical: Not on file  Tobacco Use  . Smoking status: Former Smoker    Packs/day: 0.25    Years: 0.50    Pack years: 0.12    Types: Cigarettes    Last attempt to quit: 07/25/1996    Years since quitting: 21.6  . Smokeless tobacco: Never Used  Substance and Sexual Activity  . Alcohol use: No  . Drug use: No  . Sexual activity: Yes    Birth control/protection: Implant  Lifestyle  . Physical activity:    Days per week: Not on file    Minutes per session: Not on file  . Stress: Not on file  Relationships  . Social connections:    Talks on phone: Not on file    Gets together: Not on file    Attends religious service: Not on file    Active member of club or organization: Not on file    Attends meetings of clubs or organizations: Not on file    Relationship status: Not  on file  . Intimate partner violence:    Fear of current or ex partner: Not on file    Emotionally abused: Not on file    Physically abused: Not on file    Forced sexual activity: Not on file  Other Topics Concern  . Not on file  Social History Narrative   Works at [employer].        Family History  Problem Relation Age of Onset  . Heart disease Mother        CHF (no details)  . Hypertension Mother   . Lupus Mother   . Heart disease Father        Murmur  . Heart disease Sister 15        No details.  History of a pacemaker    Vitals:   02/23/18 1506  BP: 107/71  Pulse: (!) 132  SpO2: 94%  Weight: 143 lb (64.9 kg)    PHYSICAL EXAM: General: NAD Neck: No JVD, no thyromegaly or thyroid nodule.  Lungs: Occasional rhonchi CV: Nondisplaced PMI.  Heart mildly tachy regular S1/S2, no S3/S4, no murmur.  No peripheral edema.  No carotid bruit.  Normal pedal pulses.  Abdomen: Soft, nontender, no hepatosplenomegaly, no distention.  Skin: Intact without lesions or rashes.  Neurologic: Alert and oriented x 3.  Psych: Normal affect. Extremities: No clubbing or cyanosis.  HEENT: Normal.   ASSESSMENT & PLAN: 1) Chronic systolic HF:  Echo 05/2014 EF 20-25%, echo 1/19 improved with EF 45-50% and mild-moderately decreased systolic function. Nonischemic cardiomyopathy.  Medtronic CRT-P device.  Optivol and REDS vest both suggest volume overload to explain her increased dyspnea, but on exam she does not look markedly volume overloaded and weight is actually down.  Given worsening dyspnea, I will treat  for volume overload. - Lasix 40 qam/20 qpm x 4 days then 40 mg daily after that.  Increase KCl to 40 mEq daily.  - Continue low dose Coreg.  - Device interrogation shows appropriate BiV pacing.    - BMET 1 week.  2) Anti-synthetase syndrome with interstitial lung disease: Suspect this also causes the persistent weakness.  She is followed by rheumatology and is on Cellcept, tacrolimus, and  prednisone.   3) Congenital AVN block s/p pacer: Has EP followup.  4) Dyspnea: As above, increased dyspnea may be fluid related rather than due to intrinsic lung disease.  - Increasing Lasix as above.  - I will get a CXR PA/lateral.   Followup next week with NP/PA.   [name] 02/24/2018

## 2018-03-04 ENCOUNTER — Encounter: Payer: Self-pay | Admitting: Cardiology

## 2018-03-04 ENCOUNTER — Encounter (HOSPITAL_COMMUNITY): Payer: Self-pay

## 2018-03-04 ENCOUNTER — Ambulatory Visit (HOSPITAL_COMMUNITY)
Admission: RE | Admit: 2018-03-04 | Discharge: 2018-03-04 | Disposition: A | Discharge status: Home / Self Care | Payer: Medicare Other | Source: Ambulatory Visit | Attending: Internal Medicine | Admitting: Internal Medicine

## 2018-03-04 VITALS — BP 112/84 | HR 95 | Wt 144.6 lb

## 2018-03-04 DIAGNOSIS — F319 Bipolar disorder, unspecified: Secondary | ICD-10-CM | POA: Insufficient documentation

## 2018-03-04 DIAGNOSIS — I5022 Chronic systolic (congestive) heart failure: Secondary | ICD-10-CM | POA: Diagnosis not present

## 2018-03-04 DIAGNOSIS — Z794 Long term (current) use of insulin: Secondary | ICD-10-CM | POA: Diagnosis not present

## 2018-03-04 DIAGNOSIS — M069 Rheumatoid arthritis, unspecified: Secondary | ICD-10-CM | POA: Diagnosis not present

## 2018-03-04 DIAGNOSIS — F419 Anxiety disorder, unspecified: Secondary | ICD-10-CM | POA: Diagnosis not present

## 2018-03-04 DIAGNOSIS — Z8249 Family history of ischemic heart disease and other diseases of the circulatory system: Secondary | ICD-10-CM | POA: Diagnosis not present

## 2018-03-04 DIAGNOSIS — Z87891 Personal history of nicotine dependence: Secondary | ICD-10-CM | POA: Insufficient documentation

## 2018-03-04 DIAGNOSIS — Z7952 Long term (current) use of systemic steroids: Secondary | ICD-10-CM | POA: Insufficient documentation

## 2018-03-04 DIAGNOSIS — K219 Gastro-esophageal reflux disease without esophagitis: Secondary | ICD-10-CM | POA: Insufficient documentation

## 2018-03-04 DIAGNOSIS — I5032 Chronic diastolic (congestive) heart failure: Secondary | ICD-10-CM | POA: Diagnosis not present

## 2018-03-04 DIAGNOSIS — I110 Hypertensive heart disease with heart failure: Secondary | ICD-10-CM | POA: Diagnosis not present

## 2018-03-04 DIAGNOSIS — D8989 Other specified disorders involving the immune mechanism, not elsewhere classified: Secondary | ICD-10-CM | POA: Insufficient documentation

## 2018-03-04 DIAGNOSIS — E119 Type 2 diabetes mellitus without complications: Secondary | ICD-10-CM | POA: Diagnosis not present

## 2018-03-04 DIAGNOSIS — I428 Other cardiomyopathies: Secondary | ICD-10-CM | POA: Diagnosis not present

## 2018-03-04 DIAGNOSIS — Z888 Allergy status to other drugs, medicaments and biological substances status: Secondary | ICD-10-CM | POA: Diagnosis not present

## 2018-03-04 DIAGNOSIS — J849 Interstitial pulmonary disease, unspecified: Secondary | ICD-10-CM | POA: Diagnosis not present

## 2018-03-04 DIAGNOSIS — Q246 Congenital heart block: Secondary | ICD-10-CM | POA: Insufficient documentation

## 2018-03-04 DIAGNOSIS — Z8269 Family history of other diseases of the musculoskeletal system and connective tissue: Secondary | ICD-10-CM | POA: Diagnosis not present

## 2018-03-04 DIAGNOSIS — J45909 Unspecified asthma, uncomplicated: Secondary | ICD-10-CM | POA: Diagnosis not present

## 2018-03-04 DIAGNOSIS — R0600 Dyspnea, unspecified: Secondary | ICD-10-CM

## 2018-03-04 DIAGNOSIS — Z79899 Other long term (current) drug therapy: Secondary | ICD-10-CM | POA: Insufficient documentation

## 2018-03-04 DIAGNOSIS — M329 Systemic lupus erythematosus, unspecified: Secondary | ICD-10-CM | POA: Diagnosis not present

## 2018-03-04 DIAGNOSIS — Z9981 Dependence on supplemental oxygen: Secondary | ICD-10-CM | POA: Diagnosis not present

## 2018-03-04 DIAGNOSIS — Z950 Presence of cardiac pacemaker: Secondary | ICD-10-CM | POA: Insufficient documentation

## 2018-03-04 LAB — BASIC METABOLIC PANEL
Anion gap: 8 (ref 5–15)
BUN: 9 mg/dL (ref 6–20)
CHLORIDE: 106 mmol/L (ref 101–111)
CO2: 28 mmol/L (ref 22–32)
Calcium: 8.9 mg/dL (ref 8.9–10.3)
Creatinine, Ser: 0.6 mg/dL (ref 0.44–1.00)
GFR calc Af Amer: >60 mL/min (ref >60)
GFR calc non Af Amer: >60 mL/min (ref >60)
GLUCOSE: 124 mg/dL — AB (ref 65–99)
POTASSIUM: 3.9 mmol/L (ref 3.5–5.1)
Sodium: 142 mmol/L (ref 135–145)

## 2018-03-04 NOTE — Progress Notes (Signed)
Patient ID: Makayla Vasquez, female   DOB: [birth year], 37 y.o.   MRN: [id number] PCP: Dr Blythe [facility]: Dr Shirlee Latch  HPI: Makayla Vasquez is 37 y.o. 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   She has a h/o congenital HB and underwent first PPM at age 84. In 2011 had ECHO with EF 35-40%. LV dysfunction felt to be due to RV pacing so upgraded to CRT-P. Echo in 10/15 with EF 25%.  She also has concomitant lung disease and this has limited b-blocker dosing.  She follows with Dr. Maple Hudson for her lung disease. She is a former smoker.  Admitted in 8/15 and 9/15 with worsening respiratory status and severe hypoxia. Had extensive w/u. Thought to have a viral pneumonitis complicated by atelectasis and mild edema vs. an autoimmune process. Discharged home on a course of levaquin and prednisone.   She was offered a lung biopsy for further diagnosis versus empiric treatment with steroids. She opted for empiric steroid treatment. She is followed by a rheumatologist and is on Cellcept, prednisone, and tacrolimus.  She is on oxygen and night and with activity.   Admission in 5/17 for ?sepsis and management of interstitial lung disease.     She was admitted with PNA in 1/18.  Lasix, losartan, and bisoprolol stopped.   Last echo in 1/19 showed EF 45-50%, with mild to moderately decreased RV systolic function.    She presents today for HF follow up. Last week she saw Dr Shirlee Latch and lasix was increased for a few days for volume overload. CXR showed no pneumonia. Today, she feels better. SOB has improved. She is mainly limited by fatigue. She has a productive cough with clear sputum that occurs after nasal drainage. She did not urinate much with increased lasix. Denies fever or chills. Denies orthopnea, PND, or edema. She had some generalized chest soreness this morning, but no other CP. She has  dizziness with moving her head quickly, but none with sitting to standing. Compliant with medications. Weights stable at home 140 lbs.   Medtronic device interrogation: Optivol with fluid index above threshold since early March, thoracic impedence below threshold, active <1 hour/day, bi-V pacing nearly at 100%. Personally reviewed.   Reds Vest: 51%  PFTs (12/15) showed a severe restrictive defect concerning for an interstitial process.   EP study 10/15 for persistent tachycardia and found to have sinus tachycardia.   06/16/14 CT scan showed bilateral progressive lower peribronchial infiltrates with groundglass changes and nodular patter consistent with acute pneumonitis of unclear etiology.  06/20/14 BAL showed 60% macrophages and 30% PMNs. Her AFB smear, fungal smear, legionella Ab, pneumocystis smear, ACE, and sputum culture are negative. Her AFB, legionella, fungal, and BAL culture as well as her fungal Ab, hypersensitivity pneumonitis panel were all negative Serology: DsDNA, RF, ANCA, HSP all negative  RHC 10/26/2014  RA = 2 RV = 27/1/5 PA = 29/7 (18) PCW = 4 Fick cardiac output/index = 4.5/2.2 PVR = 3.0 WU FA sat = 98% PA sat = 64%, 61%  Labs (10/15): TSH normal K 3.6, creatinine 1.1 Labs (10/31/2014) L k 3.9 Creatinine 1.06  Labs 07/23/2015: K 3.8 Creatinine 0.82  Labs (3/17): K 4, creatinine 0.6 Labs (5/17): K 4.1, creatinine 0.7 Labs (1/18): K 3.6, creatinine 0.42 Labs (2/19): K 3.5, creatinine 0.77  Review of systems complete and found to be negative unless listed in HPI.   Past Medical  History:  Diagnosis Date  . Anxiety   . Arthritis    rheumatoid arthritis- mild, no rheumatology care   . Asthma    seasonal allergies   . Asymptomatic LV dysfunction    a. Echo in Dec 2011 with EF 35 to 40%. Felt to be due to paced rhythm. b. EF 25-30% in 07/2014.  . Bipolar affective disorder (HCC)   . Cardiac pacemaker    a. Since age 50 in 27. b. Upgrade to BiV in 2013.  [name] Carpal  tunnel syndrome of right wrist   . CHF (congestive heart failure) (HCC)   . Congenital complete AV block   . Depression    bipolar  . Diabetes mellitus without complication (HCC)   . GERD (gastroesophageal reflux disease)   . Hypertension   . Interstitial lung disease (HCC)   . Lupus (HCC)   . Lupus (systemic lupus erythematosus) (HCC)   . Obesity   . Pneumonitis    a. a/w hypoxia - inflammatory - large workup 07/2014.  . Presence of permanent cardiac pacemaker   . Seizures (HCC)    as a child- from high fever  . Sinus tachycardia     Current Outpatient Medications  Medication Sig Dispense Refill  . benzonatate (TESSALON) 100 MG capsule Take 1 capsule (100 mg total) by mouth every 8 (eight) hours. 21 capsule 0  . calcium-vitamin D (OSCAL WITH D) 500-200 MG-UNIT TABS tablet Take 1 tablet by mouth 2 (two) times daily with a meal.  11  . carvedilol (COREG) 3.125 MG tablet Take 1 tablet (3.125 mg total) by mouth 2 (two) times daily. 60 tablet 3  . dicyclomine (BENTYL) 20 MG tablet Take 20 mg by mouth every 6 (six) hours.    [name] EASY TOUCH PEN NEEDLES 31G X 8 MM MISC USE AS DIRECTED 100 each 11  . Etonogestrel (NEXPLANON [place]) Inject into the skin once.     . furosemide (LASIX) 40 MG tablet Take 1 tablet (40 mg total) by mouth daily. (Patient taking differently: Take 20 mg by mouth daily. ) 30 tablet 3  . LANTUS SOLOSTAR 100 UNIT/ML Solostar Pen INJECT 15 UNITS SUBCUTANEOUSLY ONCE DAILY (Patient taking differently: INJECT 27 UNITS SUBCUTANEOUSLY ONCE DAILY) 15 mL 3  . loratadine (CLARITIN) 10 MG tablet Take 1 tablet (10 mg total) by mouth daily. 30 tablet 11  . metFORMIN (GLUCOPHAGE) 1000 MG tablet Take 1 tablet (1,000 mg total) 2 (two) times daily with a meal by mouth. 180 tablet 3  . metFORMIN (GLUCOPHAGE) 1000 MG tablet Take 1 tablet (1,000 mg total) by mouth 2 (two) times daily with a meal. 90 tablet 3  . mycophenolate (CELLCEPT) 500 MG tablet Take 1,000 mg by mouth 2 (two) times daily.      . naproxen (NAPROSYN) 500 MG tablet Take 1 tablet (500 mg total) by mouth 2 (two) times daily with a meal. For seven days 20 tablet 2  . NOVOLOG FLEXPEN 100 UNIT/ML FlexPen INJECT 8 UNITS INTO THE SKIN 3 (THREE) TIMES DAILY WITH MEALS. 15 pen 5  . OXYGEN Inhale 4 L into the lungs.    . pantoprazole (PROTONIX) 40 MG tablet TAKE 1 TABLET (40MG  TOTAL) BY MOUTH DAILY 90 tablet 3  . potassium chloride SA (KLOR-CON M20) 20 MEQ tablet Take 2 tablets (40 mEq total) by mouth daily. 60 tablet 3  . predniSONE (DELTASONE) 50 MG tablet Take 25 mg by mouth daily with breakfast.    . ranitidine (ZANTAC) 150 MG tablet Take  150 mg by mouth 2 (two) times daily.    . tacrolimus (PROGRAF) 0.5 MG capsule Take 1.5 mg by mouth 2 (two) times daily.    . Vitamin D, Ergocalciferol, (DRISDOL) 50000 units CAPS capsule Take 50,000 Units by mouth once a week.  1   No current facility-administered medications for this encounter.     Allergies  Allergen Reactions  . 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 Sertraline Hcl Hives  . Tape Other (See Comments)    Burns skin      Social History   Socioeconomic History  . Marital status: Single    Spouse name: Not on file  . Number of children: 1  . Years of education: Not on file  . Highest education level: Not on file  Occupational History  . Occupation: CNA  Social Needs  . Financial resource strain: Not on file  . Food insecurity:    Worry: Not on file    Inability: Not on file  . Transportation needs:    Medical: Not on file    Non-medical: Not on file  Tobacco Use  . Smoking status: Former Smoker    Packs/day: 0.25    Years: 0.50    Pack years: 0.12    Types: Cigarettes    Last attempt to quit: 07/25/1996    Years since quitting: 21.6  . Smokeless tobacco: Never Used  Substance and Sexual Activity  . Alcohol use: No  . Drug use: No  . Sexual activity: Yes    Birth  control/protection: Implant  Lifestyle  . Physical activity:    Days per week: Not on file    Minutes per session: Not on file  . Stress: Not on file  Relationships  . Social connections:    Talks on phone: Not on file    Gets together: Not on file    Attends religious service: Not on file    Active member of club or organization: Not on file    Attends meetings of clubs or organizations: Not on file    Relationship status: Not on file  . Intimate partner violence:    Fear of current or ex partner: Not on file    Emotionally abused: Not on file    Physically abused: Not on file    Forced sexual activity: Not on file  Other Topics Concern  . Not on file  Social History Narrative   Works at [employer].        Family History  Problem Relation Age of Onset  . Heart disease Mother        CHF (no details)  . Hypertension Mother   . Lupus Mother   . Heart disease Father        Murmur  . Heart disease Sister 72        No details.  History of a pacemaker    Vitals:   03/04/18 1209  BP: 112/84  Pulse: 95  SpO2: 92%  Weight: 144 lb 9.6 oz (65.6 kg)   Wt Readings from Last 3 Encounters:  03/04/18 144 lb 9.6 oz (65.6 kg)  02/23/18 143 lb (64.9 kg)  02/11/18 145 lb (65.8 kg)    PHYSICAL EXAM: General: Well appearing. No resp difficulty. HEENT: Normal Neck: Supple. JVP ~7. Carotids 2+ bilat; no bruits. No thyromegaly or nodule noted. Cor: PMI nondisplaced. RRR, No M/G/R noted Lungs: CTAB, normal effort. On 2 L [place]  Abdomen: Soft, non-tender, non-distended, no HSM. No bruits or masses. +BS  Extremities: No cyanosis, clubbing, or rash. R and LLE no edema.  Neuro: Alert & orientedx3, cranial nerves grossly intact. moves all 4 extremities w/o difficulty. Affect pleasant  ASSESSMENT & PLAN: 1) Chronic systolic HF:  Echo 05/2014 EF 20-25%, echo 1/19 improved with EF 45-50% and mild-moderately decreased systolic function. Nonischemic cardiomyopathy.  Medtronic CRT-P device. - NYHA  III, Volume status appears stable on exam despite elevation on Optivol and ReDs vest of 51% - Continue lasix 20 mg daily. BMET today - Continue low dose Coreg.  - Device interrogation shows appropriate BiV pacing.    - Discussed with Dr Shirlee Latch - will keep lasix at 20 mg daily for now. Likely RedsVest is not accurate with her ILD. Will follow up in 2-3 weeks and if she feels worse, would need to set up for RHC to assess volume. 2) Anti-synthetase syndrome with interstitial lung disease: Suspect this also causes the persistent weakness.  She is followed by rheumatology and is on Cellcept, tacrolimus, and prednisone.  Wears 2-4 L O2  3) Congenital AVN block s/p pacer: Has EP followup. No change.  4) Dyspnea:  - Improved. - Continue lasix 20 mg daily. BMET today  BMET Follow up in 2-3 weeks. If she feels worse, would need to set up for RHC to assess volume.   Alford Highland 03/04/2018   Greater than 50% of the 25 minute visit was spent in counseling/coordination of care regarding disease state education, salt/fluid restriction, sliding scale diuretics, and medication compliance.

## 2018-03-04 NOTE — Patient Instructions (Signed)
No changes to medication at this time.  Routine lab work today. Will notify you of abnormal results, otherwise no news is good news!  Follow up 2-3 weeks.  _____________________________________________________________ [place] Code: 1300  Take all medication as prescribed the day of your appointment. Bring all medications with you to your appointment.  Do the following things EVERYDAY: 1) Weigh yourself in the morning before breakfast. Write it down and keep it in a log. 2) Take your medicines as prescribed 3) Eat low salt foods-Limit salt (sodium) to 2000 mg per day.  4) Stay as active as you can everyday 5) Limit all fluids for the day to less than 2 liters

## 2018-03-04 NOTE — Progress Notes (Signed)
ReDS Vest - 03/04/18 1200      ReDS Vest   MR   Moderate    Fitting Posture  Standing    Height Marker  Short    Ruler Value  19.5    Center [place]  Aligned    ReDS Value  51

## 2018-03-08 DIAGNOSIS — Z79899 Other long term (current) drug therapy: Secondary | ICD-10-CM | POA: Diagnosis not present

## 2018-03-10 ENCOUNTER — Encounter: Payer: Self-pay | Admitting: Obstetrics & Gynecology

## 2018-03-10 ENCOUNTER — Ambulatory Visit (INDEPENDENT_AMBULATORY_CARE_PROVIDER_SITE_OTHER): Payer: Medicare Other | Admitting: Obstetrics & Gynecology

## 2018-03-10 ENCOUNTER — Other Ambulatory Visit: Payer: Self-pay

## 2018-03-10 VITALS — BP 118/80 | HR 125 | Wt 139.0 lb

## 2018-03-10 DIAGNOSIS — N921 Excessive and frequent menstruation with irregular cycle: Secondary | ICD-10-CM

## 2018-03-10 DIAGNOSIS — Z978 Presence of other specified devices: Secondary | ICD-10-CM

## 2018-03-10 DIAGNOSIS — Z975 Presence of (intrauterine) contraceptive device: Principal | ICD-10-CM

## 2018-03-10 NOTE — Progress Notes (Signed)
GYNECOLOGY OFFICE VISIT NOTE  History:  37 y.o. G1P1001 here today for follow up after evaluation for breakthrough bleeding on Nexplanon. She was given Naproxen for treatment, also pelvic ultrasound was ordered.  Was supposed to come back to discuss results and have annual pap smear. Since last visit, she reports resolution of symptoms. Pap was done by her PCP last month and was normal. She denies any abnormal vaginal discharge, bleeding, pelvic pain or other concerns.   Past Medical History:  Diagnosis Date  . Anxiety   . Arthritis    rheumatoid arthritis- mild, no rheumatology care   . Asthma    seasonal allergies   . Asymptomatic LV dysfunction    a. Echo in Dec 2011 with EF 35 to 40%. Felt to be due to paced rhythm. b. EF 25-30% in 07/2014.  . Bipolar affective disorder (HCC)   . Cardiac pacemaker    a. Since age 38 in 46. b. Upgrade to BiV in 2013.  [name] Carpal tunnel syndrome of right wrist   . CHF (congestive heart failure) (HCC)   . Congenital complete AV block   . Depression    bipolar  . Diabetes mellitus without complication (HCC)   . GERD (gastroesophageal reflux disease)   . Hypertension   . Interstitial lung disease (HCC)   . Lupus (HCC)   . Lupus (systemic lupus erythematosus) (HCC)   . Obesity   . Pneumonitis    a. a/w hypoxia - inflammatory - large workup 07/2014.  . Presence of permanent cardiac pacemaker   . Seizures (HCC)    as a child- from high fever  . Sinus tachycardia     Past Surgical History:  Procedure Laterality Date  . ATRIAL TACH ABLATION N/A 08/14/2014   Procedure: ATRIAL TACH ABLATION;  Surgeon: Marinus Maw, MD;  Location: [facility] CATH LAB;  Service: Cardiovascular;  Laterality: N/A;  . BI-VENTRICULAR PACEMAKER UPGRADE N/A 03/08/2012   Procedure: BI-VENTRICULAR PACEMAKER UPGRADE;  Surgeon: Marinus Maw, MD;  Location: [facility] CATH LAB;  Service: Cardiovascular;  Laterality: N/A;  . CARPAL TUNNEL WITH CUBITAL TUNNEL Right 07/26/2013   Procedure:  RIGHT LIMITED OPEN CARPAL TUNNEL RELEASE ,  RIGHT CUBITAL TUNNEL RELEASE, INSITU VERSES ULNAR NERVE DECOMPRESSION AND ANTERIOR TRANSPOSITION;  Surgeon: Dominica Severin, MD;  Location: [facility] OR;  Service: Orthopedics;  Laterality: Right;  . CESAREAN SECTION    . CHOLECYSTECTOMY    . CYST EXCISION  12/10/2012   THYROID  . INSERT / REPLACE / REMOVE PACEMAKER     2001  . IUD REMOVAL  11/03/2011   Procedure: INTRAUTERINE DEVICE (IUD) REMOVAL;  Surgeon: Myra C. Marice Potter, MD;  Location: [facility] ORS;  Service: Gynecology;  Laterality: N/A;  . NASAL FRACTURE SURGERY     /w plate   . RIGHT HEART CATHETERIZATION N/A 10/26/2014   Procedure: RIGHT HEART CATH;  Surgeon: Dolores Patty, MD;  Location: [facility] CATH LAB;  Service: Cardiovascular;  Laterality: N/A;  . THROAT SURGERY  1994   s/p laser treatment  . THYROGLOSSAL DUCT CYST N/A 12/10/2012   Procedure: REVISION OF THYROGLOSSAL DUCT CYST EXCISION;  Surgeon: Serena Colonel, MD;  Location: [facility] OR;  Service: ENT;  Laterality: N/A;  Revision of Thyroglossal Duct Cyst Excision  . VIDEO BRONCHOSCOPY Bilateral 06/19/2014   Procedure: VIDEO BRONCHOSCOPY WITHOUT FLUORO;  Surgeon: Kalman Shan, MD;  Location[facility] ENDOSCOPY;  Service: Cardiopulmonary;  Laterality: Bilateral;    The following portions of the patient's history were reviewed and updated as appropriate: allergies,  current medications, past family history, past medical history, past social history, past surgical history and problem list.   Health Maintenance:  Normal pap and negative HRHPV on 02/11/2018.  Review of Systems:  Pertinent items noted in HPI and remainder of comprehensive ROS otherwise negative.  Objective:  Physical Exam BP 118/80   Pulse (!) 125   Wt 139 lb (63 kg)   BMI 23.13 kg/m  CONSTITUTIONAL: Well-developed, well-nourished female in no acute distress.  SKIN: Skin is warm and dry. No rash noted. Not diaphoretic. No erythema. No pallor. NEUROLOGIC: Alert and oriented to person, place, and  time. Normal reflexes, muscle tone coordination. No cranial nerve deficit noted. PSYCHIATRIC: Normal mood and affect. Normal behavior. Normal judgment and thought content. CARDIOVASCULAR: Normal heart rate noted RESPIRATORY: Effort and breath sounds normal, no problems with respiration noted ABDOMEN: Soft, no distention noted.   PELVIC: Deferred MUSCULOSKELETAL: Normal range of motion. No edema noted.  Labs and Imaging Result Date: 02/15/2018 CLINICAL DATA:  Abnormal uterine bleeding. Polycystic ovary syndrome. Nexplanon. EXAM: TRANSABDOMINAL AND TRANSVAGINAL ULTRASOUND OF PELVIS TECHNIQUE: Both transabdominal and transvaginal ultrasound examinations of the pelvis were performed. Transabdominal technique was performed for global imaging of the pelvis including uterus, ovaries, adnexal regions, and pelvic cul-de-sac. It was necessary to proceed with endovaginal exam following the transabdominal exam to visualize the endometrial thickness and ovaries. COMPARISON:  None FINDINGS: Uterus Measurements: 8.7 x 3.5 x 4.3 cm. Retroflexed. No fibroids or other mass visualized. Endometrium Thickness: 6 mm.  No focal abnormality visualized. Right ovary Measurements: 2.4 x 1.4 x 1.4 cm. A few tiny follicles are seen. No ovarian or adnexal mass identified. Left ovary Measurements: 2.0 x 1.7 x 1.6 cm. A few tiny follicles are seen. An ovoid cystic structure is seen along the inferior margin of the ovary which measures 2.8 cm and has benign features. This could represent a follicular cyst, paraovarian cyst, or small hydrosalpinx. Other findings No abnormal free fluid. IMPRESSION: 2.8 cm benign-appearing ovoid cystic structure along the inferior margin of the left ovary. Differential diagnosis includes follicular cyst, paraovarian cyst, or small hydrosalpinx. Otherwise unremarkable exam. Electronically Signed   By: Myles Rosenthal M.D.   On: 02/15/2018 13:42    Assessment & Plan:  1. Breakthrough bleeding on  Nexplanon Ultrasound results discussed, no follow up needed at this point. Resolved. Continue Naproxen as needed.  Routine preventative health maintenance measures emphasized. Please refer to After Visit Summary for other counseling recommendations.   Return if symptoms worsen or fail to improve.   Total face-to-face time with patient: 10 minutes.  Over 50% of encounter was spent on counseling and coordination of care.   Jaynie Collins, MD, FACOG Obstetrician & Gynecologist, [facility] for [employer], [facility]

## 2018-03-10 NOTE — Patient Instructions (Signed)
Return to clinic for any scheduled appointments or for any gynecologic concerns as needed.   

## 2018-03-11 IMAGING — CR DG CHEST 2V
2 series · 2 of 2 positions shown · non-contrast
Comparison: Chest radiograph December 19, 2015

CLINICAL DATA: Generalized body aches, cough and fever for 1 week.
History of hypertension, diabetes, lupus.

EXAM:
CHEST  2 VIEW

[w chest lat]
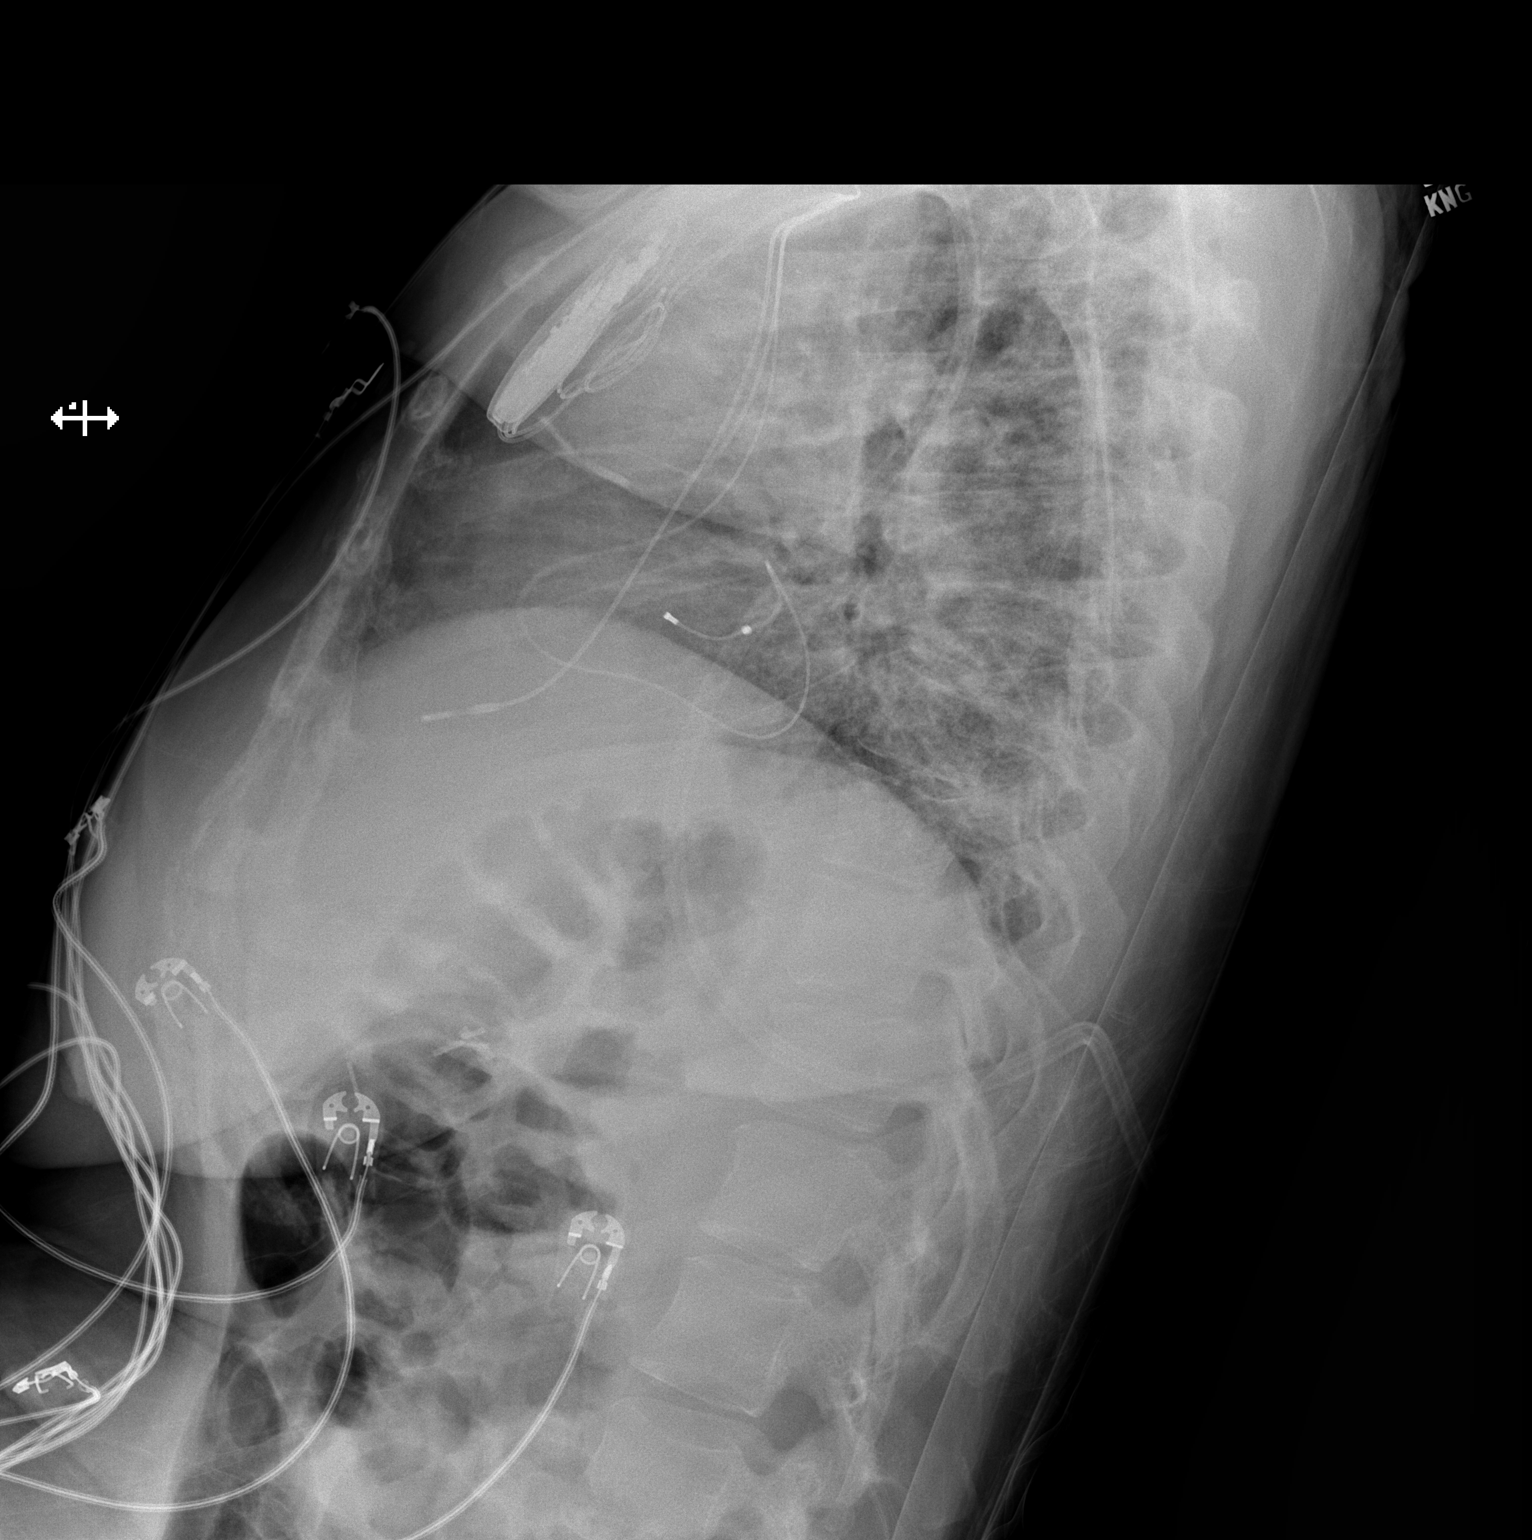

[x chest ap]
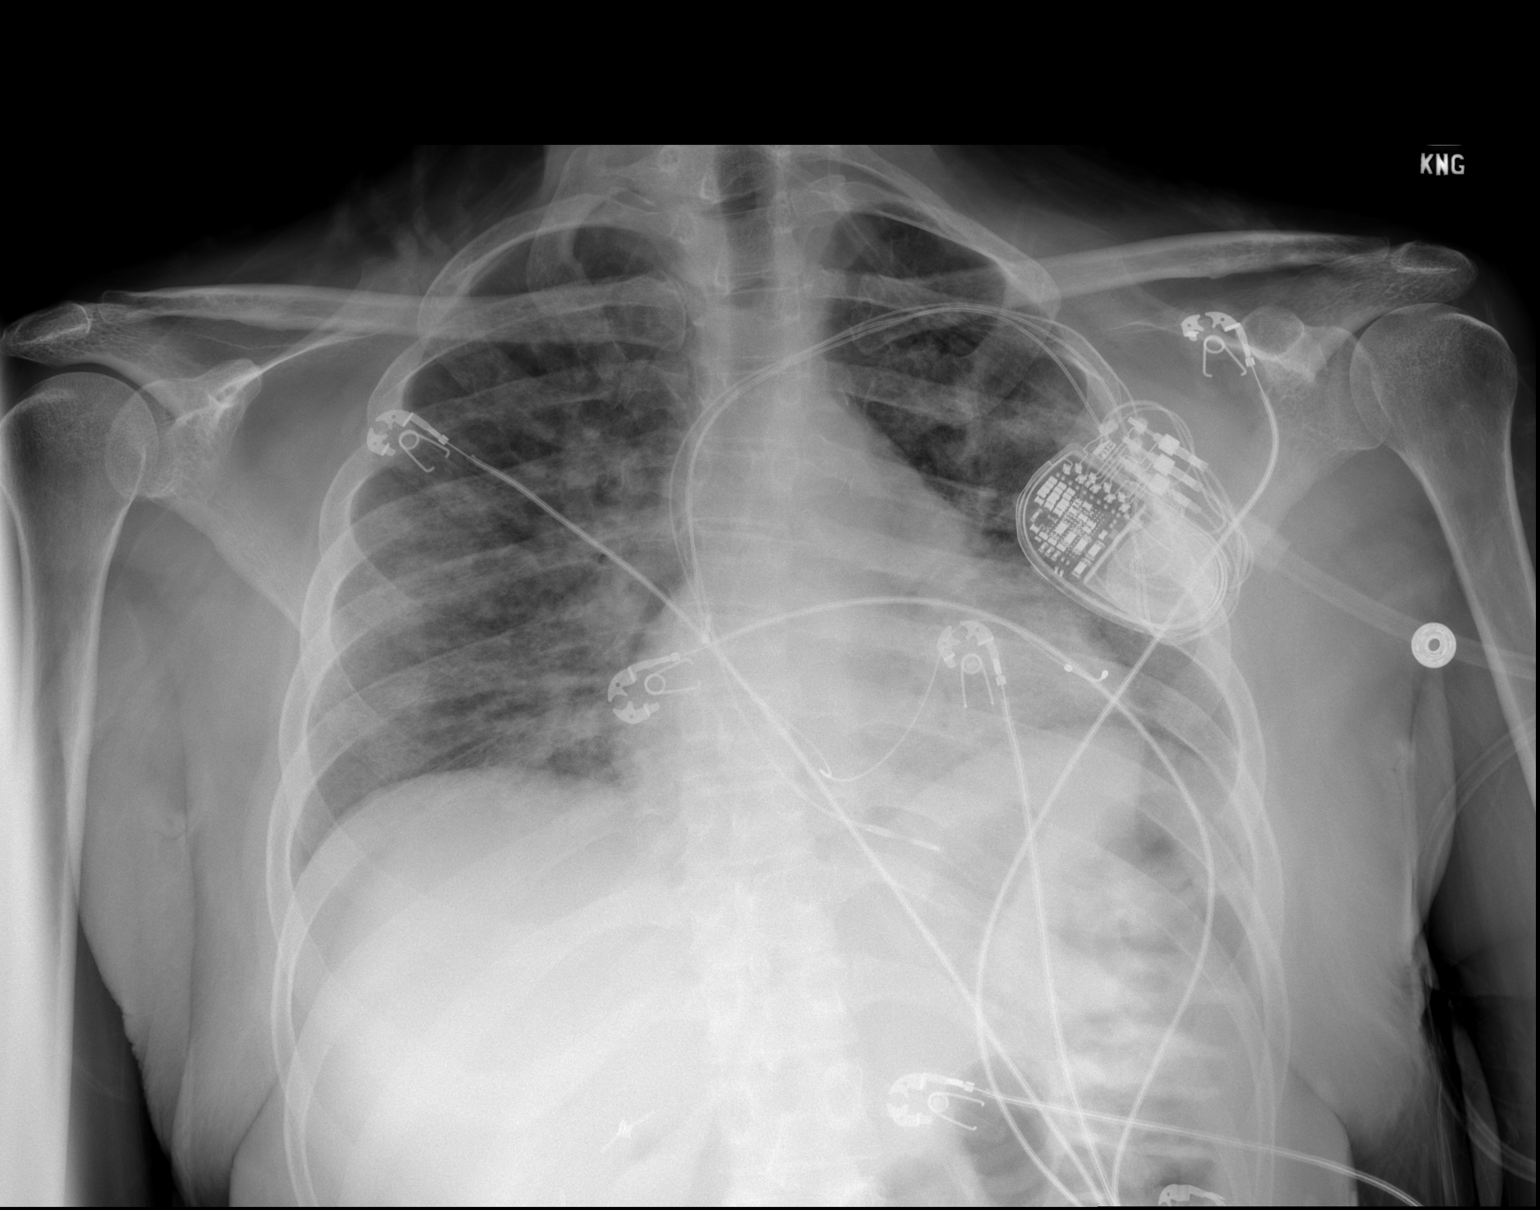

[2 of 2 positions shown; findings below may reference images not displayed]

FINDINGS: Cardiomediastinal silhouette is normal for this low inspiratory
portable examination with crowded vascular markings. Pulmonary
vascular congestion without pleural effusion or focal consolidation.
Mild interstitial prominence. LEFT cardiac pacemaker in situ. No
pneumothorax. Soft tissue planes and included osseous structures are
nonsuspicious. Surgical clips in the included right abdomen
compatible with cholecystectomy.
IMPRESSION: Pulmonary vascular congestion and mild interstitial prominence
suggesting early pulmonary edema without focal consolidation.

## 2018-03-16 IMAGING — CT CT ABD-PELV W/ CM
2 of 4 series · 15 of 46 positions shown, 17 images · IV contrast (ISOVUE)
Comparison: 12/14/2015 CT abdomen/ pelvis.

CLINICAL DATA: Abdominal pain, nausea, vomiting and diarrhea.
Sepsis. Lupus.

EXAM:
CT ABDOMEN AND PELVIS WITH CONTRAST
TECHNIQUE: Multidetector CT imaging of the abdomen and pelvis was performed
using the standard protocol following bolus administration of
intravenous contrast.
CONTRAST:  100mL [id number] IOPAMIDOL ([id number]) INJECTION 61%

[Series 2: abd/pel with · axial · 0.79mm/px · z∈[-264,+201]mm · 12 of 105 slices shown, 14 images]
[im 6/105  soft-tissue]
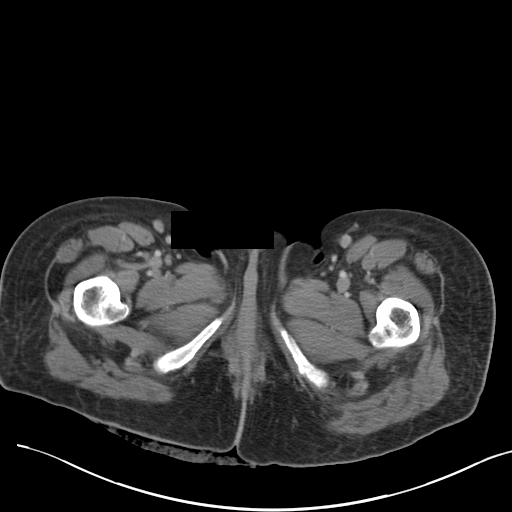
[im 6/105  bone]
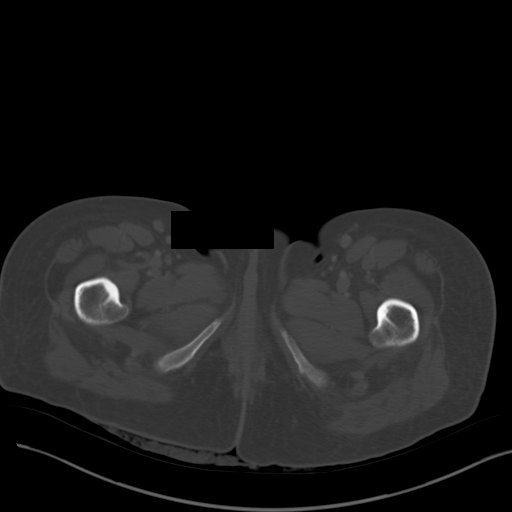
[im 16/105  soft-tissue]
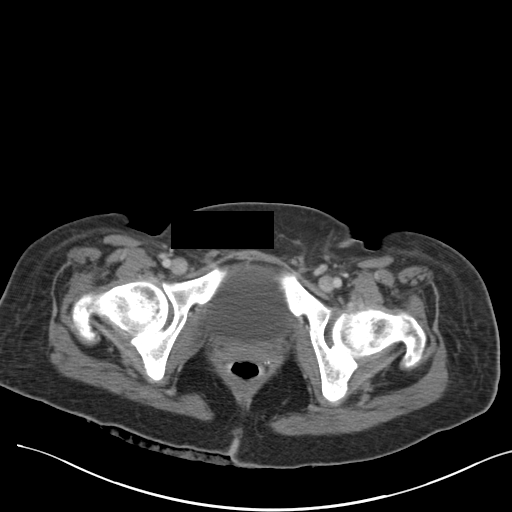
[im 21/105  soft-tissue]
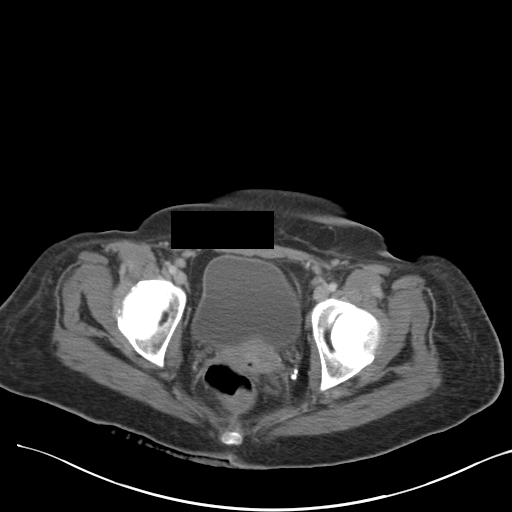
[im 32/105  soft-tissue]
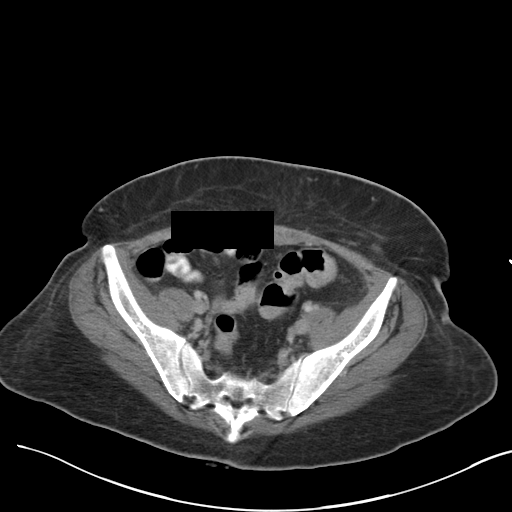
[im 42/105  soft-tissue]
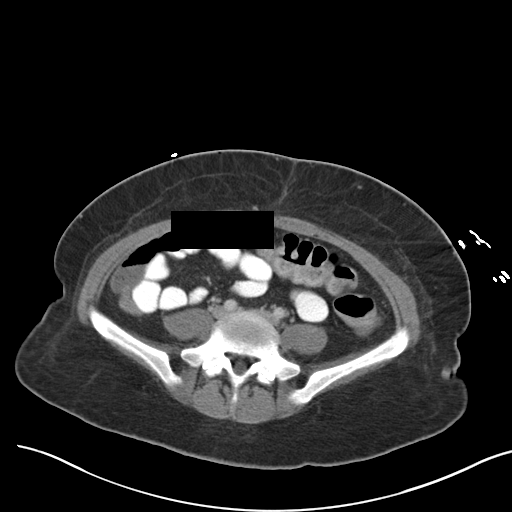
[im 47/105  soft-tissue]
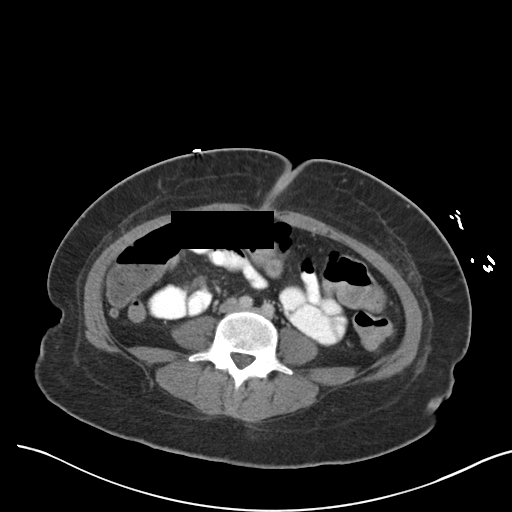
[im 58/105  soft-tissue]
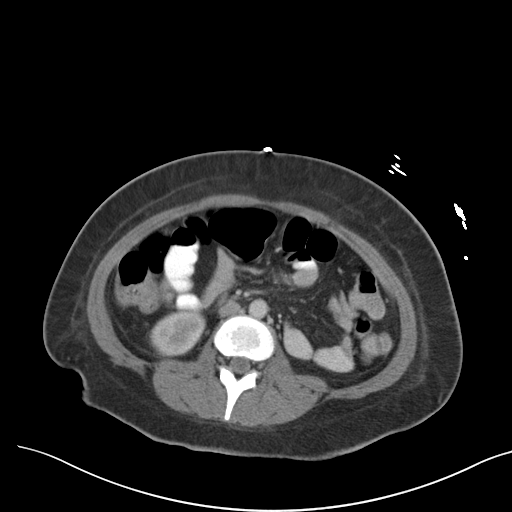
[im 63/105  soft-tissue]
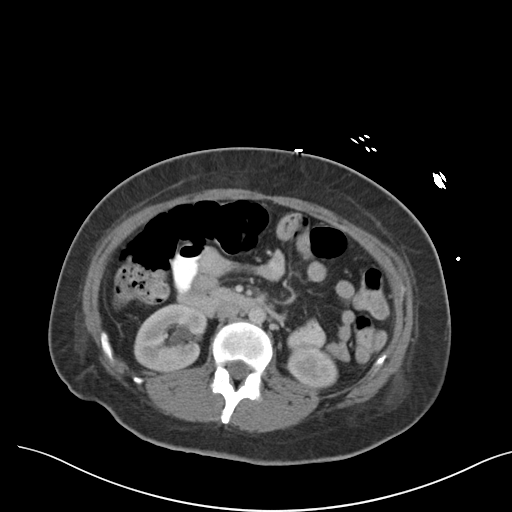
[im 73/105  soft-tissue]
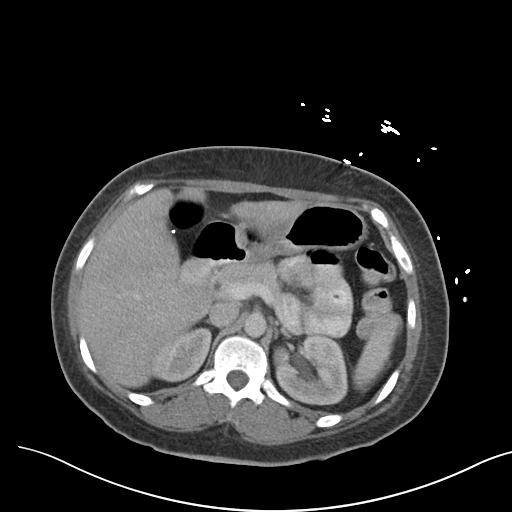
[im 73/105  bone]
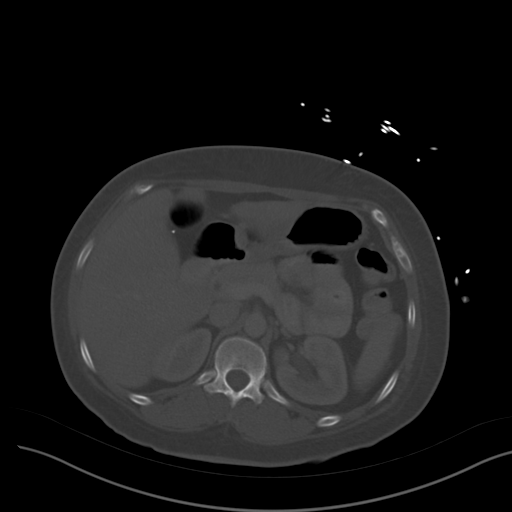
[im 84/105  soft-tissue]
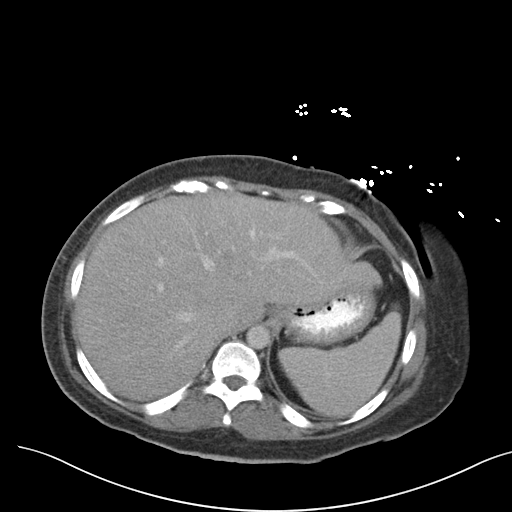
[im 89/105  soft-tissue]
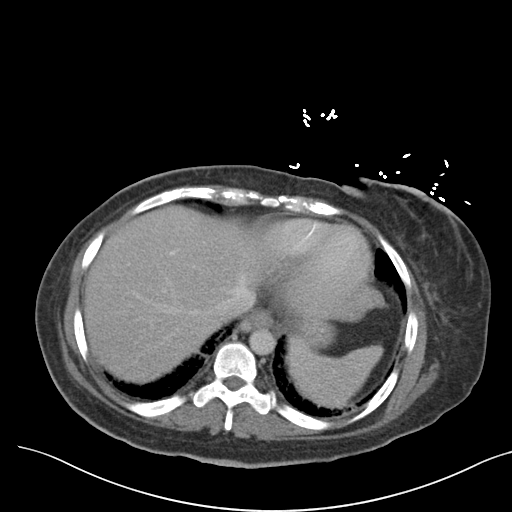
[im 99/105  soft-tissue]
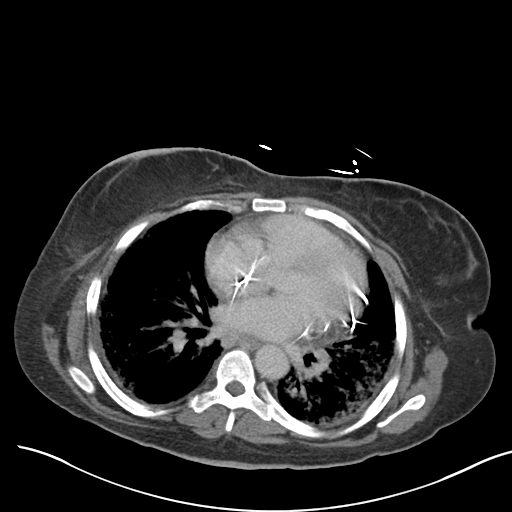

[Series 5: coronal a/|p · coronal · 0.69mm/px · 3 of 123 slices shown]
[im 41/123  soft-tissue]
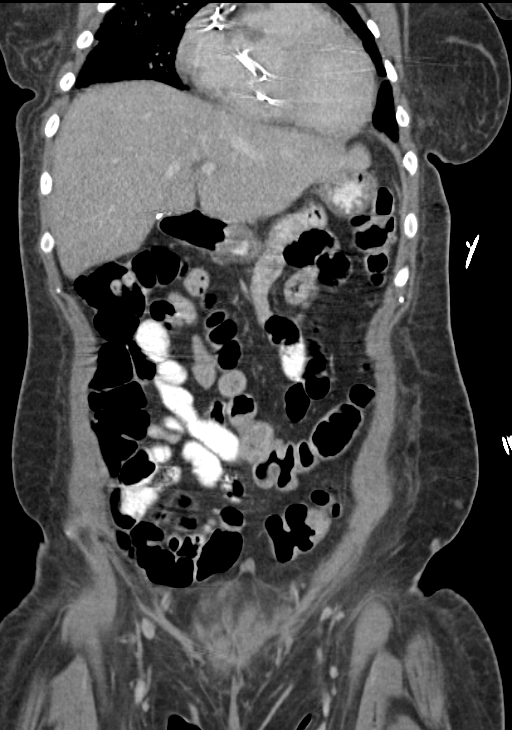
[im 55/123  soft-tissue]
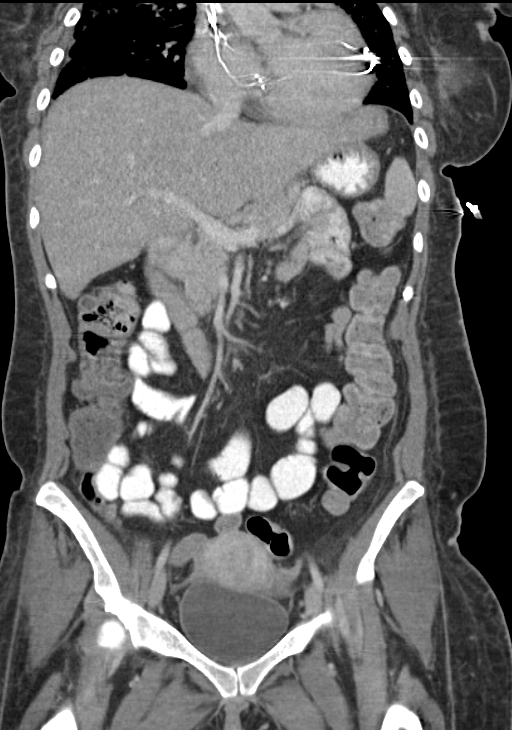
[im 68/123  soft-tissue]
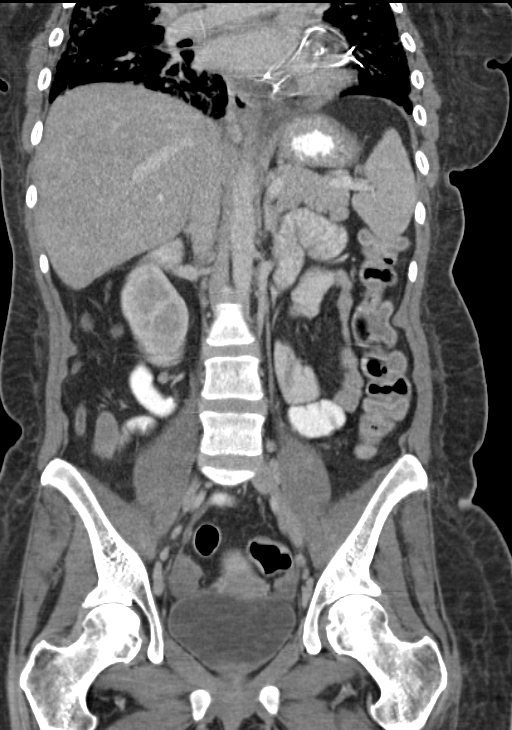

[15 of 46 positions shown; findings below may reference images not displayed]

FINDINGS: Lower chest: Trace bilateral pleural effusions. Pacemaker lead tips
are seen in the right atrium, right ventricular apex and coronary
sinus. Trace pericardial fluid/thickening. Extensive ground-glass
attenuation and peribronchovascular and subpleural reticulation at
both lung bases, not appreciably changed.

Hepatobiliary: Normal liver with no liver mass. Cholecystectomy. No
biliary ductal dilatation.

Pancreas: Normal, with no mass or duct dilation.

Spleen: Normal size. No mass.

Adrenals/Urinary Tract: Normal adrenals. Stable fullness of the
central renal collecting systems bilaterally without overt
hydronephrosis. Normal caliber ureters. No contour deforming renal
masses. Normal bladder.

Stomach/Bowel: Grossly normal stomach. Normal caliber small bowel
with no small bowel wall thickening. Normal appendix. Oral contrast
progresses to the distal small bowel. Normal large bowel with no
diverticulosis, large bowel wall thickening or pericolonic fat
stranding.

Vascular/Lymphatic: Normal caliber abdominal aorta. Patent portal,
splenic, hepatic and renal veins. No pathologically enlarged lymph
nodes in the abdomen or pelvis.

Reproductive: Grossly normal uterus.  No adnexal mass.

Other: No pneumoperitoneum. Trace free fluid in the pelvic
cul-de-sac. No focal intra-abdominal fluid collections.

Musculoskeletal: No aggressive appearing focal osseous lesions.
Stable mild T11 compression deformity.
IMPRESSION: 1. No evidence of bowel obstruction or acute bowel inflammation.
Normal appendix.
2. No appreciable change in extensive ground-glass attenuation and
reticulation at both lung bases, incompletely evaluated on this
scan, presumably the chronic sequela of lupus pneumonitis given the
similar findings have been present back to 05/26/2014 chest CT.

## 2018-03-16 IMAGING — DX DG CHEST 1V
1 series · 1 of 1 positions shown · non-contrast
Comparison: 02/16/2016

CLINICAL DATA: Shortness of breath, chest pain, potential sepsis

EXAM:
CHEST 1 VIEW

[chest ap]
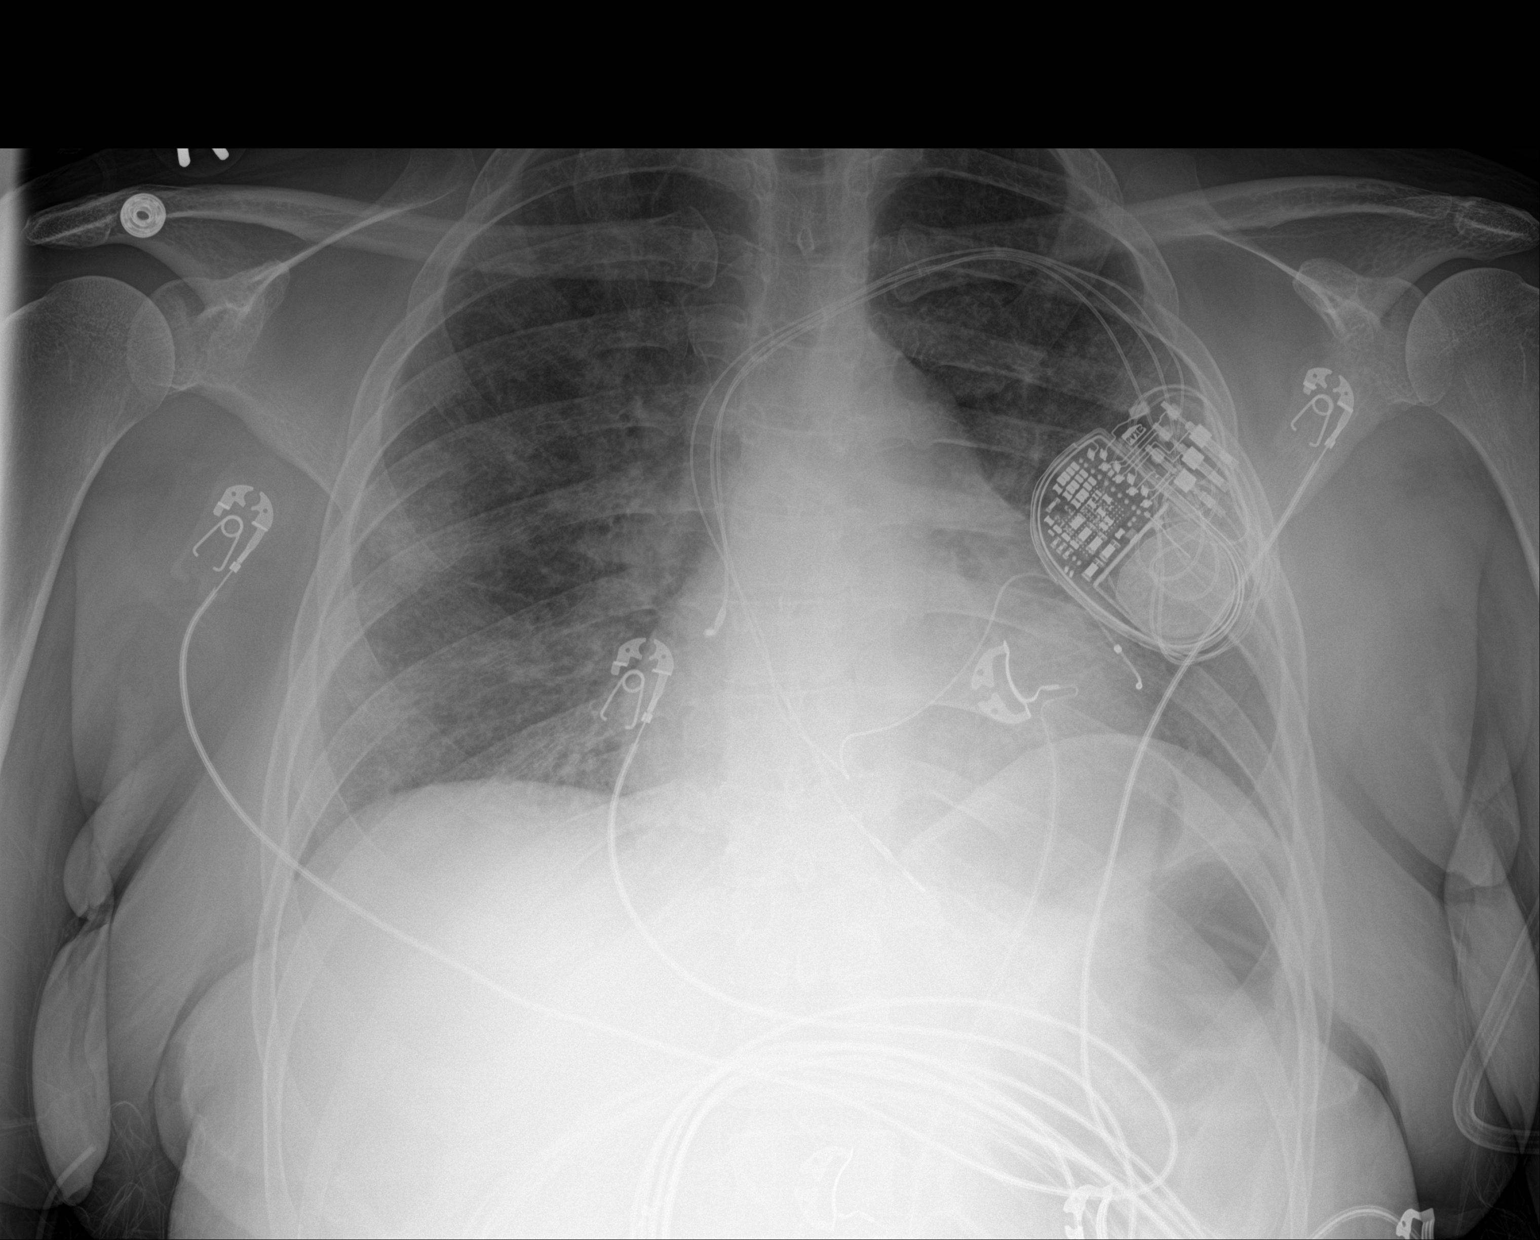

[1 of 1 positions shown; findings below may reference images not displayed]

FINDINGS: Cardiomediastinal silhouette is stable. Three leads cardiac
pacemaker is unchanged in position. Central mild vascular congestion
and mild interstitial prominence bilaterally suspicious for
interstitial edema. No segmental consolidation.
IMPRESSION: Three leads cardiac pacemaker is unchanged in position. Central mild
vascular congestion and mild interstitial prominence bilaterally
suspicious for interstitial edema. No segmental consolidation.

## 2018-03-17 IMAGING — CT CT CHEST W/ CM
2 of 5 series · 12 of 36 positions shown, 14 images · IV contrast (Iodine)
Comparison: Chest CT [DATE] [DATE], [DATE]; [DATE] [DATE], [DATE]

CLINICAL DATA: Chest tightness with shortness of breath and fatigue

EXAM:
CT CHEST WITH CONTRAST
TECHNIQUE: Multidetector CT imaging of the chest was performed during
intravenous contrast administration.
CONTRAST:  75 mL Nsovue-ELL nonionic

[Series 206: axial mprs · axial · 0.68mm/px · z∈[+242,+437]mm · 7 of 106 slices shown, 9 images]
[im 14/106  mediastinal]
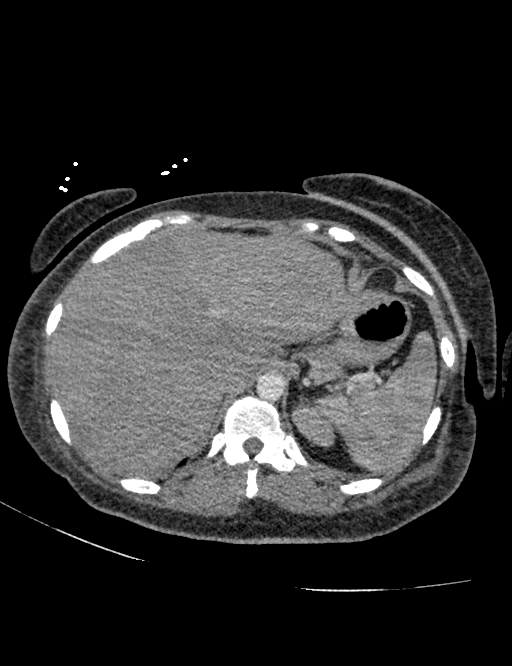
[im 14/106  lung]
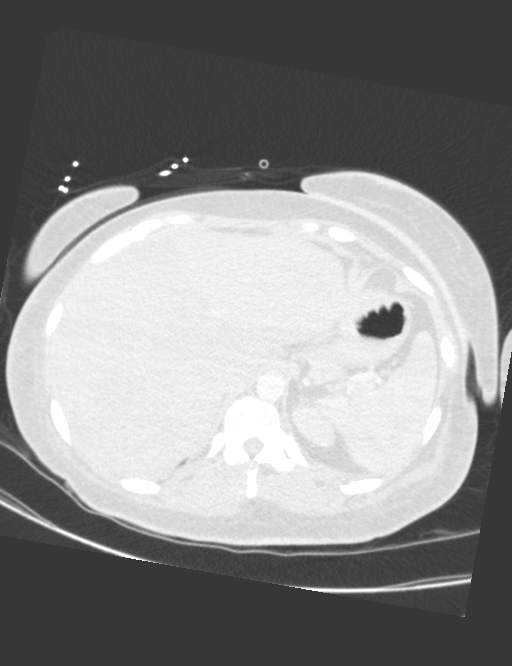
[im 27/106  lung]
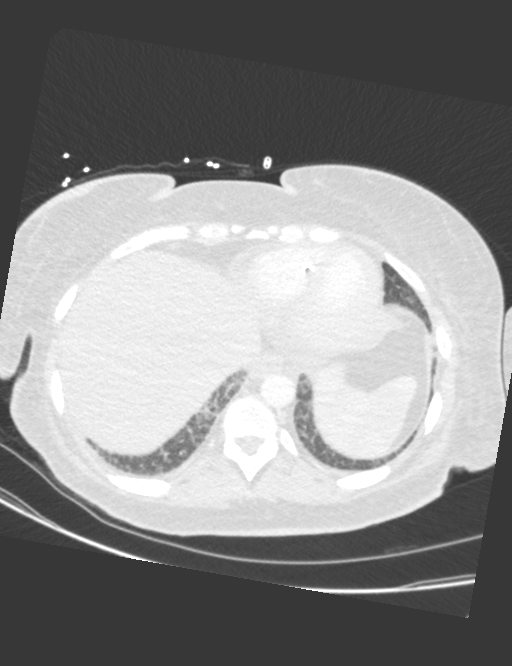
[im 40/106  lung]
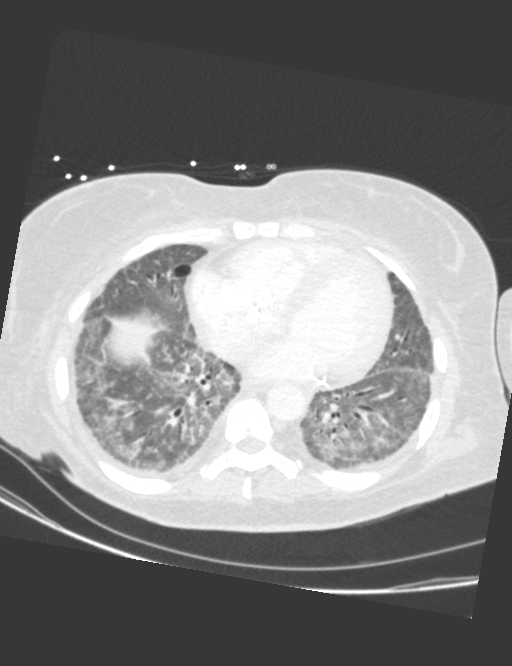
[im 53/106  lung]
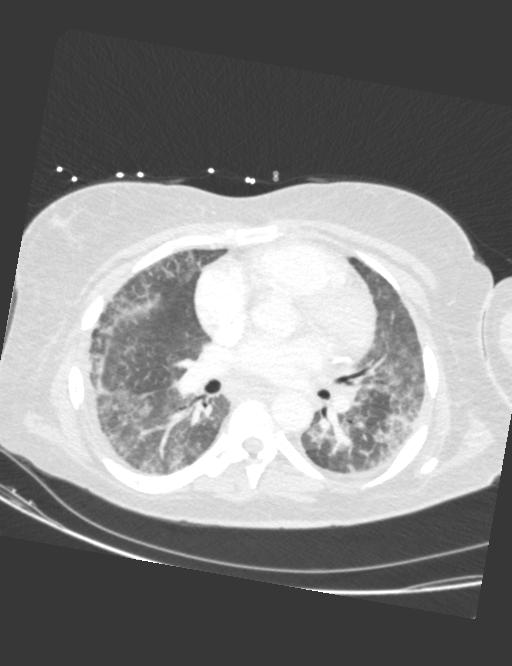
[im 66/106  mediastinal]
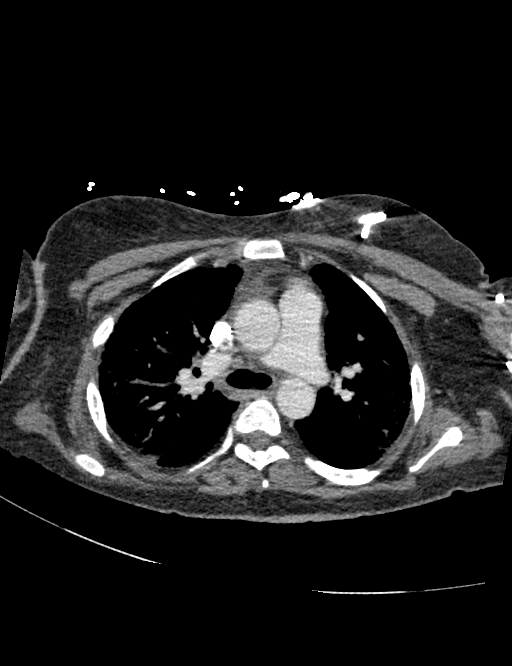
[im 66/106  lung]
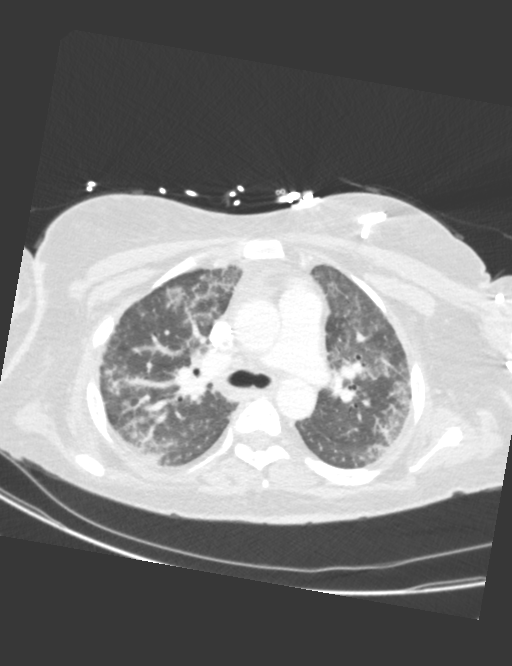
[im 79/106  lung]
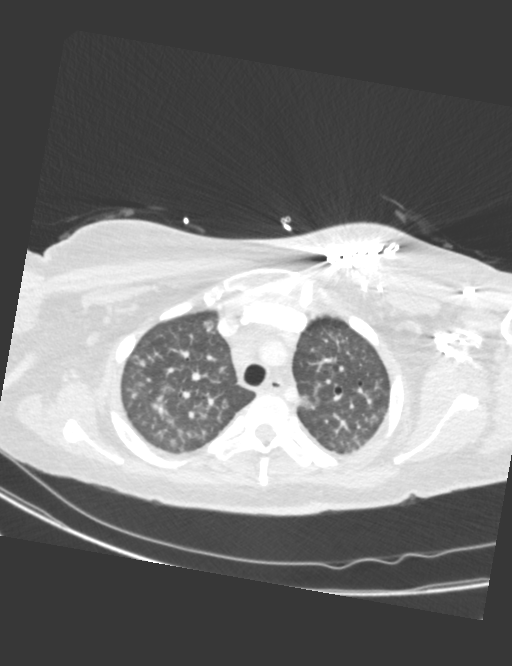
[im 92/106  lung]
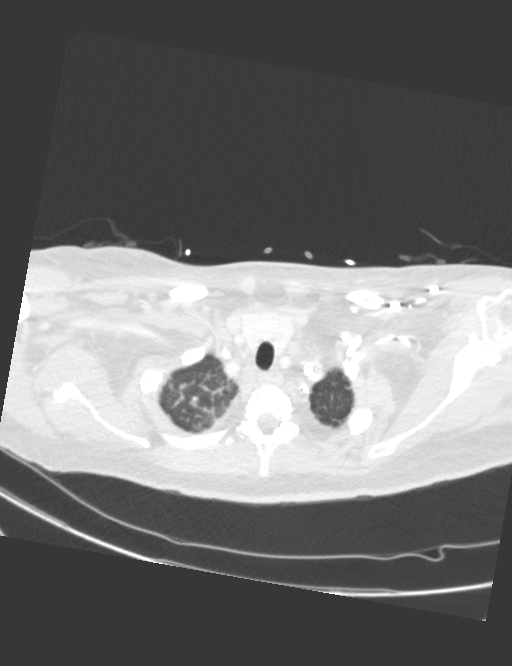

[Series 207: lung mprs · axial · 0.68mm/px · z∈[+242,+372]mm · 5 of 106 slices shown]
[im 14/106  lung]
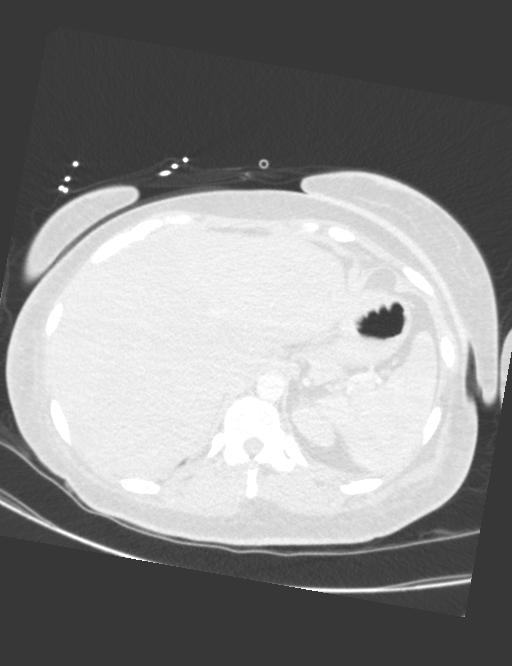
[im 27/106  lung]
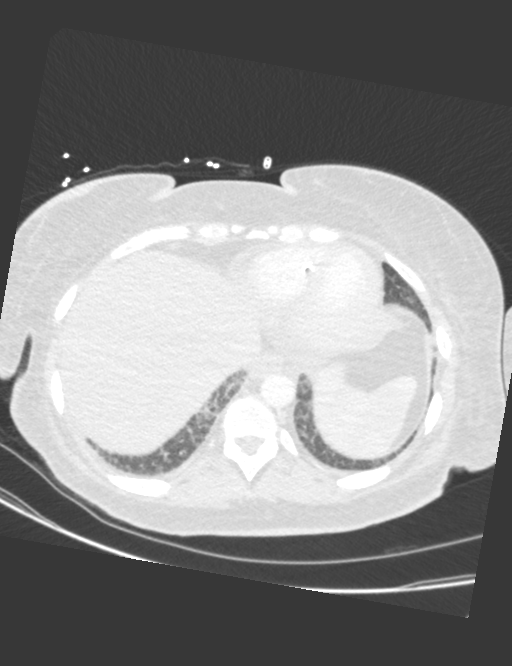
[im 40/106  lung]
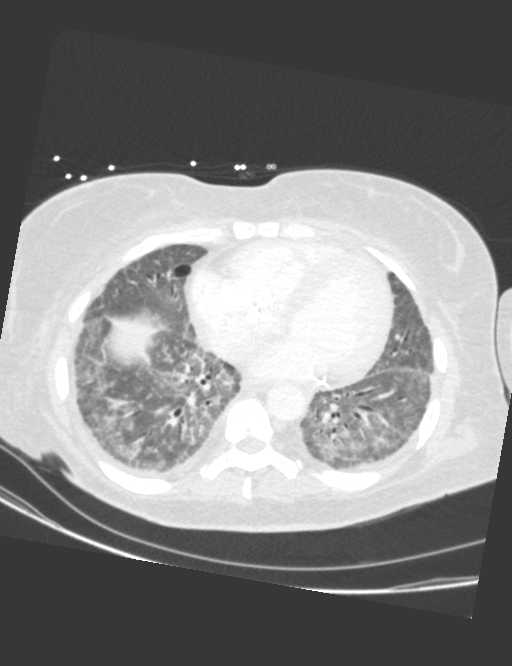
[im 53/106  lung]
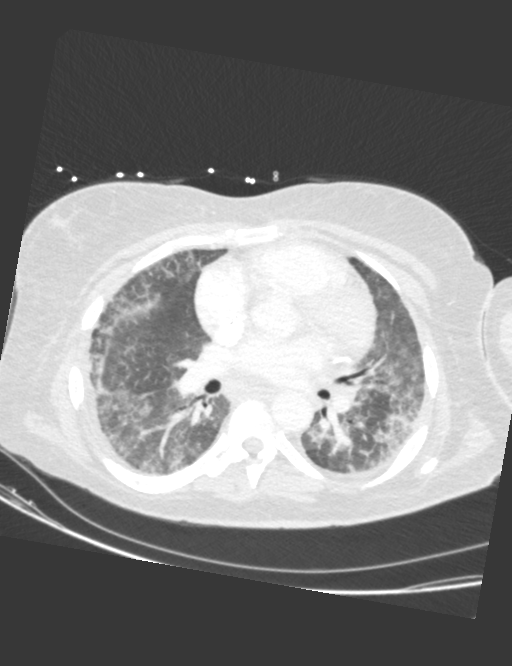
[im 66/106  lung]
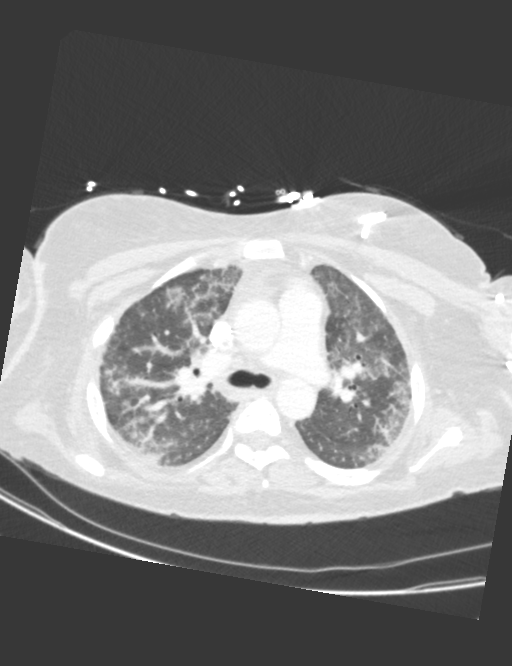

[12 of 36 positions shown; findings below may reference images not displayed]

FINDINGS: Mediastinum/Lymph Nodes: Thyroid is mildly prominent without focal
demonstrable lesion. There are several mildly prominent lymph nodes.
There is a focal right peritracheal lymph node measuring 1.7 x
cm. There are several prominent left paratracheal lymph nodes,
largest measuring 2.0 x 1.3 cm. There is an enlarged right hilar
lymph node measuring 2.5 x 1.7 cm. There is a sub- carinal lymph
node measuring 2.2 x 1.7 cm.

There is mild cardiomegaly. There is no appreciable pericardial
thickening. Pacemaker leads are attached to the right atrium, right
ventricle, and left ventricle. There is no appreciable thoracic
aortic aneurysm or dissection. No major vessel pulmonary embolus is
evident on this study. Visualized great vessels appear unremarkable.
Note that the left and right common carotid arteries arise as a
common trunk, an anatomic variant.

Lungs/Pleura: There is diffuse interstitial and patchy alveolar
opacity throughout the lungs bilaterally. There is considerably less
edema currently compared to the prior study from 2.5 months prior.
There is a well-defined nodular opacity in the posterior segment of
the left lower lobe measuring 5 x 5 mm which may have been obscured
by edema on the prior study. Note that there is lower lobe
bronchiectatic change bilaterally.

Upper abdomen: There are probable parapelvic cysts in the left
kidney; left kidney incompletely visualized. Gallbladder absent.
Visualized upper abdominal structures otherwise appear unremarkable.

Musculoskeletal: There is a limbus type vertebra at T10. No blastic
or lytic bone lesions.
IMPRESSION: Interstitial and patchy alveolar opacity, felt to most likely
represent pulmonary edema. There is cardiomegaly. There is felt to
be a degree of congestive heart failure. Note that a degree of
superimposed pneumonia cannot be excluded on this study.

Adenopathy which may well be reactive in etiology.

Gallbladder absent.

## 2018-03-19 ENCOUNTER — Encounter: Payer: Medicare Other | Admitting: Obstetrics and Gynecology

## 2018-03-19 ENCOUNTER — Other Ambulatory Visit: Payer: Medicare Other

## 2018-03-24 ENCOUNTER — Other Ambulatory Visit: Payer: Self-pay

## 2018-03-24 ENCOUNTER — Ambulatory Visit (HOSPITAL_COMMUNITY)
Admission: RE | Admit: 2018-03-24 | Discharge: 2018-03-24 | Disposition: A | Discharge status: Home / Self Care | Payer: Medicare Other | Source: Ambulatory Visit | Attending: Internal Medicine | Admitting: Internal Medicine

## 2018-03-24 VITALS — BP 120/82 | HR 140 | Wt 140.6 lb

## 2018-03-24 DIAGNOSIS — J9610 Chronic respiratory failure, unspecified whether with hypoxia or hypercapnia: Secondary | ICD-10-CM | POA: Insufficient documentation

## 2018-03-24 DIAGNOSIS — Z87891 Personal history of nicotine dependence: Secondary | ICD-10-CM | POA: Diagnosis not present

## 2018-03-24 DIAGNOSIS — J9611 Chronic respiratory failure with hypoxia: Secondary | ICD-10-CM

## 2018-03-24 DIAGNOSIS — Z9981 Dependence on supplemental oxygen: Secondary | ICD-10-CM | POA: Diagnosis not present

## 2018-03-24 DIAGNOSIS — I428 Other cardiomyopathies: Secondary | ICD-10-CM | POA: Insufficient documentation

## 2018-03-24 DIAGNOSIS — Z794 Long term (current) use of insulin: Secondary | ICD-10-CM | POA: Insufficient documentation

## 2018-03-24 DIAGNOSIS — I5022 Chronic systolic (congestive) heart failure: Secondary | ICD-10-CM

## 2018-03-24 DIAGNOSIS — I110 Hypertensive heart disease with heart failure: Secondary | ICD-10-CM | POA: Insufficient documentation

## 2018-03-24 DIAGNOSIS — Z950 Presence of cardiac pacemaker: Secondary | ICD-10-CM | POA: Diagnosis not present

## 2018-03-24 DIAGNOSIS — Z888 Allergy status to other drugs, medicaments and biological substances status: Secondary | ICD-10-CM | POA: Insufficient documentation

## 2018-03-24 DIAGNOSIS — Z7952 Long term (current) use of systemic steroids: Secondary | ICD-10-CM | POA: Diagnosis not present

## 2018-03-24 DIAGNOSIS — Z8249 Family history of ischemic heart disease and other diseases of the circulatory system: Secondary | ICD-10-CM | POA: Insufficient documentation

## 2018-03-24 DIAGNOSIS — M069 Rheumatoid arthritis, unspecified: Secondary | ICD-10-CM | POA: Insufficient documentation

## 2018-03-24 DIAGNOSIS — Z8269 Family history of other diseases of the musculoskeletal system and connective tissue: Secondary | ICD-10-CM | POA: Diagnosis not present

## 2018-03-24 DIAGNOSIS — E119 Type 2 diabetes mellitus without complications: Secondary | ICD-10-CM | POA: Insufficient documentation

## 2018-03-24 DIAGNOSIS — I479 Paroxysmal tachycardia, unspecified: Secondary | ICD-10-CM | POA: Diagnosis not present

## 2018-03-24 DIAGNOSIS — M329 Systemic lupus erythematosus, unspecified: Secondary | ICD-10-CM | POA: Insufficient documentation

## 2018-03-24 DIAGNOSIS — J849 Interstitial pulmonary disease, unspecified: Secondary | ICD-10-CM | POA: Diagnosis not present

## 2018-03-24 DIAGNOSIS — K219 Gastro-esophageal reflux disease without esophagitis: Secondary | ICD-10-CM | POA: Insufficient documentation

## 2018-03-24 DIAGNOSIS — Z79899 Other long term (current) drug therapy: Secondary | ICD-10-CM | POA: Diagnosis not present

## 2018-03-24 NOTE — Patient Instructions (Signed)
No changes to medication at this time.  Will refer you to electrophysiology at [facility]. Address: [address] ([address] Floor), [name], [place] [address]  Phone: [phone]  Follow up 2 months with Dr. Shirlee Latch.  Take all medication as prescribed the day of your appointment. Bring all medications with you to your appointment.  Do the following things EVERYDAY: 1) Weigh yourself in the morning before breakfast. Write it down and keep it in a log. 2) Take your medicines as prescribed 3) Eat low salt foods-Limit salt (sodium) to 2000 mg per day.  4) Stay as active as you can everyday 5) Limit all fluids for the day to less than 2 liters

## 2018-03-24 NOTE — Progress Notes (Signed)
Patient ID: Makayla Vasquez, female   DOB: 01-Aug-1981, 37 y.o.   MRN: [id number] PCP: Dr Blythe [facility] Cardiology: Dr Shirlee Latch  HPI: Ms Fait is 37 y.o. 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   She has a h/o congenital HB and underwent first PPM at age 23. In 2011 had ECHO with EF 35-40%. LV dysfunction felt to be due to RV pacing so upgraded to CRT-P. Echo in 10/15 with EF 25%.  She also has concomitant lung disease and this has limited b-blocker dosing.  She follows with Dr. Maple Hudson for her lung disease. She is a former smoker.  Admitted in 8/15 and 9/15 with worsening respiratory status and severe hypoxia. Had extensive w/u. Thought to have a viral pneumonitis complicated by atelectasis and mild edema vs. an autoimmune process. Discharged home on a course of levaquin and prednisone.   She was offered a lung biopsy for further diagnosis versus empiric treatment with steroids. She opted for empiric steroid treatment. She is followed by a rheumatologist and is on Cellcept, prednisone, and tacrolimus.  She is on oxygen and night and with activity.   Admission in 5/17 for ?sepsis and management of interstitial lung disease.     She was admitted with PNA in 1/18.  Lasix, losartan, and bisoprolol stopped.   Today she returns for HF follow up. Overall feeling fair. Ongoing dyspnea with exertion. She continues to use oxygen. Denies PND/Orthopnea. Appetite ok. No fever or chills. Weight at home has been stable. Taking all medications but only able to take 1/2 carvedilol due to hypotension.   Medtronic device interrogation: Activity ~1hour. 100% BiV pacing. Fluid well below threshold. No VT.   PFTs (12/15) showed a severe restrictive defect concerning for an interstitial process.   EP study 10/15 for persistent tachycardia and found to have sinus tachycardia.   06/16/14 CT scan showed  bilateral progressive lower peribronchial infiltrates with groundglass changes and nodular patter consistent with acute pneumonitis of unclear etiology.  06/20/14 BAL showed 60% macrophages and 30% PMNs. Her AFB smear, fungal smear, legionella Ab, pneumocystis smear, ACE, and sputum culture are negative. Her AFB, legionella, fungal, and BAL culture as well as her fungal Ab, hypersensitivity pneumonitis panel were all negative Serology: DsDNA, RF, ANCA, HSP all negative  Echo in 1/19 showed EF 45-50%, with mild to moderately decreased RV systolic function  RHC 10/26/2014  RA = 2 RV = 27/1/5 PA = 29/7 (18) PCW = 4 Fick cardiac output/index = 4.5/2.2 PVR = 3.0 WU FA sat = 98% PA sat = 64%, 61%  Labs (10/15): TSH normal K 3.6, creatinine 1.1 Labs (10/31/2014) L k 3.9 Creatinine 1.06  Labs 07/23/2015: K 3.8 Creatinine 0.82  Labs (3/17): K 4, creatinine 0.6 Labs (5/17): K 4.1, creatinine 0.7 Labs (1/18): K 3.6, creatinine 0.42 Labs (2/19): K 3.5, creatinine 0.77  Review of systems complete and found to be negative unless listed in HPI.   Past Medical History:  Diagnosis Date  . Anxiety   . Arthritis    rheumatoid arthritis- mild, no rheumatology care   . Asthma    seasonal allergies   . Asymptomatic LV dysfunction    a. Echo in Dec 2011 with EF 35 to 40%. Felt to be due to paced rhythm. b. EF 25-30% in 07/2014.  . Bipolar affective disorder (HCC)   . Cardiac pacemaker    a. Since age 37 in  1997. b. Upgrade to BiV in 2013.  [name] Carpal tunnel syndrome of right wrist   . CHF (congestive heart failure) (HCC)   . Congenital complete AV block   . Depression    bipolar  . Diabetes mellitus without complication (HCC)   . GERD (gastroesophageal reflux disease)   . Hypertension   . Interstitial lung disease (HCC)   . Lupus (HCC)   . Lupus (systemic lupus erythematosus) (HCC)   . Obesity   . Pneumonitis    a. a/w hypoxia - inflammatory - large workup 07/2014.  . Presence of permanent  cardiac pacemaker   . Seizures (HCC)    as a child- from high fever  . Sinus tachycardia     Current Outpatient Medications  Medication Sig Dispense Refill  . benzonatate (TESSALON) 100 MG capsule Take 1 capsule (100 mg total) by mouth every 8 (eight) hours. 21 capsule 0  . calcium-vitamin D (OSCAL WITH D) 500-200 MG-UNIT TABS tablet Take 1 tablet by mouth 2 (two) times daily with a meal.  11  . carvedilol (COREG) 3.125 MG tablet Take 1 tablet (3.125 mg total) by mouth 2 (two) times daily. 60 tablet 3  . dicyclomine (BENTYL) 20 MG tablet Take 20 mg by mouth every 6 (six) hours.    [name] EASY TOUCH PEN NEEDLES 31G X 8 MM MISC USE AS DIRECTED 100 each 11  . Etonogestrel (NEXPLANON [place]) Inject into the skin once.     . furosemide (LASIX) 40 MG tablet Take 1 tablet (40 mg total) by mouth daily. (Patient taking differently: Take 20 mg by mouth daily. ) 30 tablet 3  . LANTUS SOLOSTAR 100 UNIT/ML Solostar Pen INJECT 15 UNITS SUBCUTANEOUSLY ONCE DAILY (Patient taking differently: INJECT 27 UNITS SUBCUTANEOUSLY ONCE DAILY) 15 mL 3  . loratadine (CLARITIN) 10 MG tablet Take 1 tablet (10 mg total) by mouth daily. 30 tablet 11  . metFORMIN (GLUCOPHAGE) 1000 MG tablet Take 1 tablet (1,000 mg total) by mouth 2 (two) times daily with a meal. 90 tablet 3  . mycophenolate (CELLCEPT) 500 MG tablet Take 1,000 mg by mouth 2 (two) times daily.     . naproxen (NAPROSYN) 500 MG tablet Take 1 tablet (500 mg total) by mouth 2 (two) times daily with a meal. For seven days 20 tablet 2  . NOVOLOG FLEXPEN 100 UNIT/ML FlexPen INJECT 8 UNITS INTO THE SKIN 3 (THREE) TIMES DAILY WITH MEALS. 15 pen 5  . OXYGEN Inhale 4 L into the lungs.    . pantoprazole (PROTONIX) 40 MG tablet TAKE 1 TABLET (40MG  TOTAL) BY MOUTH DAILY 90 tablet 3  . potassium chloride SA (KLOR-CON M20) 20 MEQ tablet Take 2 tablets (40 mEq total) by mouth daily. 60 tablet 3  . predniSONE (DELTASONE) 50 MG tablet Take 25 mg by mouth daily with breakfast.    .  ranitidine (ZANTAC) 150 MG tablet Take 150 mg by mouth 2 (two) times daily.    . tacrolimus (PROGRAF) 0.5 MG capsule Take 1.5 mg by mouth 2 (two) times daily.    . Vitamin D, Ergocalciferol, (DRISDOL) 50000 units CAPS capsule Take 50,000 Units by mouth once a week.  1   No current facility-administered medications for this encounter.     Allergies  Allergen Reactions  . 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 Sertraline Hcl Hives  . Tape Other (See Comments)    Burns skin  Social History   Socioeconomic History  . Marital status: Single    Spouse name: Not on file  . Number of children: 1  . Years of education: Not on file  . Highest education level: Not on file  Occupational History  . Occupation: CNA  Social Needs  . Financial resource strain: Not on file  . Food insecurity:    Worry: Not on file    Inability: Not on file  . Transportation needs:    Medical: Not on file    Non-medical: Not on file  Tobacco Use  . Smoking status: Former Smoker    Packs/day: 0.25    Years: 0.50    Pack years: 0.12    Types: Cigarettes    Last attempt to quit: 07/25/1996    Years since quitting: 21.6  . Smokeless tobacco: Never Used  Substance and Sexual Activity  . Alcohol use: No  . Drug use: No  . Sexual activity: Yes    Birth control/protection: Implant  Lifestyle  . Physical activity:    Days per week: Not on file    Minutes per session: Not on file  . Stress: Not on file  Relationships  . Social connections:    Talks on phone: Not on file    Gets together: Not on file    Attends religious service: Not on file    Active member of club or organization: Not on file    Attends meetings of clubs or organizations: Not on file    Relationship status: Not on file  . Intimate partner violence:    Fear of current or ex partner: Not on file    Emotionally abused: Not on file    Physically abused: Not on  file    Forced sexual activity: Not on file  Other Topics Concern  . Not on file  Social History Narrative   Works at [employer].        Family History  Problem Relation Age of Onset  . Heart disease Mother        CHF (no details)  . Hypertension Mother   . Lupus Mother   . Heart disease Father        Murmur  . Heart disease Sister 52        No details.  History of a pacemaker    Vitals:   03/24/18 1116  BP: 120/82  Pulse: (!) 140  SpO2: 100%  Weight: 140 lb 9.6 oz (63.8 kg)   Wt Readings from Last 3 Encounters:  03/24/18 140 lb 9.6 oz (63.8 kg)  03/10/18 139 lb (63 kg)  03/04/18 144 lb 9.6 oz (65.6 kg)    PHYSICAL EXAM: General:  Appears chronically ill. No resp difficulty HEENT: normal Neck: supple. no JVD. Carotids 2+ bilat; no bruits. No lymphadenopathy or thryomegaly appreciated. Cor: PMI nondisplaced. Tachy regular rate & rhythm. No rubs, gallops or murmurs. Lungs: clear on 2 liters oxygen.  Abdomen: soft, nontender, nondistended. No hepatosplenomegaly. No bruits or masses. Good bowel sounds. Extremities: no cyanosis, clubbing, rash, edema Neuro: alert & orientedx3, cranial nerves grossly intact. moves all 4 extremities w/o difficulty. Affect pleasant  EKG: V paced 132 bpm  ASSESSMENT & PLAN: 1) Chronic systolic HF:  Echo 05/2014 EF 20-25%, echo 1/19 improved with EF 45-50% and mild-moderately decreased systolic function. Nonischemic cardiomyopathy.  Medtronic CRT-P device. - NYHA III. Volume status stable.  - Continue lasix 20 mg daily.  - Continue low dose Coreg- She is actually only taking  1/2 of the 3.125 mg tablet.   - I considered corlanor for tachycardia however EF 40-45% so this will not be an option.   2) Anti-synthetase syndrome with interstitial lung disease: Suspect this also causes the persistent weakness.  She is followed by rheumatology and is on Cellcept, tacrolimus, and prednisone.  Wears 2-4 L O2  3) Congenital AVN block s/p pacer: Refer back  to Dr Ladona Ridgel for ongoing tachycardia.   4) Chronic Respiratory Failure On chronic oxgyen. Followed by pulmonary.   Discussed with Dr Shirlee Latch.  Follow up 2 months with Dr Shirlee Latch.  Referred back to Dr Ladona Ridgel tachycardia.   Kieran Nachtigal NP-C  03/24/2018

## 2018-03-25 DIAGNOSIS — J069 Acute upper respiratory infection, unspecified: Secondary | ICD-10-CM | POA: Diagnosis not present

## 2018-03-25 DIAGNOSIS — J329 Chronic sinusitis, unspecified: Secondary | ICD-10-CM | POA: Diagnosis not present

## 2018-03-25 DIAGNOSIS — R748 Abnormal levels of other serum enzymes: Secondary | ICD-10-CM | POA: Diagnosis not present

## 2018-03-25 DIAGNOSIS — Z79899 Other long term (current) drug therapy: Secondary | ICD-10-CM | POA: Diagnosis not present

## 2018-03-25 DIAGNOSIS — D8989 Other specified disorders involving the immune mechanism, not elsewhere classified: Secondary | ICD-10-CM | POA: Diagnosis not present

## 2018-03-25 DIAGNOSIS — J849 Interstitial pulmonary disease, unspecified: Secondary | ICD-10-CM | POA: Diagnosis not present

## 2018-03-25 DIAGNOSIS — M609 Myositis, unspecified: Secondary | ICD-10-CM | POA: Diagnosis not present

## 2018-04-01 ENCOUNTER — Encounter: Payer: Self-pay | Admitting: Internal Medicine

## 2018-04-01 ENCOUNTER — Ambulatory Visit (INDEPENDENT_AMBULATORY_CARE_PROVIDER_SITE_OTHER): Payer: Medicare Other | Admitting: Internal Medicine

## 2018-04-01 VITALS — BP 114/72 | HR 84 | Ht 65.0 in | Wt 137.0 lb

## 2018-04-01 DIAGNOSIS — Z950 Presence of cardiac pacemaker: Secondary | ICD-10-CM | POA: Diagnosis not present

## 2018-04-01 DIAGNOSIS — I442 Atrioventricular block, complete: Secondary | ICD-10-CM | POA: Diagnosis not present

## 2018-04-01 DIAGNOSIS — I5022 Chronic systolic (congestive) heart failure: Secondary | ICD-10-CM

## 2018-04-01 DIAGNOSIS — R000 Tachycardia, unspecified: Secondary | ICD-10-CM | POA: Diagnosis not present

## 2018-04-01 LAB — CUP PACEART INCLINIC DEVICE CHECK
Date Time Interrogation Session: 20190613124621
Implantable Lead Implant Date: 19971017
Implantable Lead Implant Date: 20130520
Implantable Lead Location: 753859
Implantable Lead Model: 4196
Implantable Pulse Generator Implant Date: 20130520
Lead Channel Setting Pacing Amplitude: 1.75 V
Lead Channel Setting Pacing Amplitude: 2 V
Lead Channel Setting Pacing Amplitude: 2 V
Lead Channel Setting Pacing Pulse Width: 0.4 ms
Lead Channel Setting Pacing Pulse Width: 0.4 ms
Lead Channel Setting Sensing Sensitivity: 0.9 mV
MDC IDC LEAD IMPLANT DT: 19971017
MDC IDC LEAD LOCATION: 753858
MDC IDC LEAD LOCATION: 753860

## 2018-04-01 NOTE — Patient Instructions (Signed)

## 2018-04-01 NOTE — Progress Notes (Addendum)
HPI Makayla Vasquez returns today after a long absence from our arrhythmia clinic. She is a pleasant 37 yo woman with a h/o congenital CHB, s/p PPM insertion. In the interim, she has been found to have interstitial lung disease with an anti-synthetase syndrome. She is now on chronic oxygen therapy. She is only taking her coreg a couple of times a week. Her pressures tend to run on the low side. Repeat echo showed that her EF was 50%. Her weight is down 50 lbs since I saw her last. Allergies  Allergen Reactions  . 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 Sertraline Hcl Hives  . Tape Other (See Comments)    Burns skin     Current Outpatient Medications  Medication Sig Dispense Refill  . benzonatate (TESSALON) 100 MG capsule Take 1 capsule (100 mg total) by mouth every 8 (eight) hours. 21 capsule 0  . calcium-vitamin D (OSCAL WITH D) 500-200 MG-UNIT TABS tablet Take 1 tablet by mouth 2 (two) times daily with a meal.  11  . carvedilol (COREG) 3.125 MG tablet Take 1 tablet (3.125 mg total) by mouth 2 (two) times daily. 60 tablet 3  . dicyclomine (BENTYL) 20 MG tablet Take 20 mg by mouth every 6 (six) hours.    [name] EASY TOUCH PEN NEEDLES 31G X 8 MM MISC USE AS DIRECTED 100 each 11  . Etonogestrel (NEXPLANON [place]) Inject into the skin once.     . furosemide (LASIX) 40 MG tablet Take 1 tablet (40 mg total) by mouth daily. (Patient taking differently: Take 20 mg by mouth daily. ) 30 tablet 3  . LANTUS SOLOSTAR 100 UNIT/ML Solostar Pen INJECT 15 UNITS SUBCUTANEOUSLY ONCE DAILY (Patient taking differently: INJECT 27 UNITS SUBCUTANEOUSLY ONCE DAILY) 15 mL 3  . loratadine (CLARITIN) 10 MG tablet Take 1 tablet (10 mg total) by mouth daily. 30 tablet 11  . metFORMIN (GLUCOPHAGE) 1000 MG tablet Take 1 tablet (1,000 mg total) by mouth 2 (two) times daily with a meal. 90 tablet 3  . mycophenolate (CELLCEPT) 500 MG tablet Take 1,000 mg by mouth  2 (two) times daily.     [name] NOVOLOG FLEXPEN 100 UNIT/ML FlexPen INJECT 8 UNITS INTO THE SKIN 3 (THREE) TIMES DAILY WITH MEALS. 15 pen 5  . OXYGEN Inhale 4 L into the lungs.    . pantoprazole (PROTONIX) 40 MG tablet TAKE 1 TABLET (40MG  TOTAL) BY MOUTH DAILY 90 tablet 3  . potassium chloride SA (KLOR-CON M20) 20 MEQ tablet Take 2 tablets (40 mEq total) by mouth daily. 60 tablet 3  . predniSONE (DELTASONE) 50 MG tablet Take 25 mg by mouth daily with breakfast.    . ranitidine (ZANTAC) 150 MG tablet Take 150 mg by mouth 2 (two) times daily.    . tacrolimus (PROGRAF) 0.5 MG capsule Take 1.5 mg by mouth 2 (two) times daily.    . Vitamin D, Ergocalciferol, (DRISDOL) 50000 units CAPS capsule Take 50,000 Units by mouth once a week.  1   No current facility-administered medications for this visit.      Past Medical History:  Diagnosis Date  . Anxiety   . Arthritis    rheumatoid arthritis- mild, no rheumatology care   . Asthma    seasonal allergies   . Asymptomatic LV dysfunction    a. Echo in Dec 2011 with EF 35 to 40%. Felt to be due to paced rhythm. b.  EF 25-30% in 07/2014.  . Bipolar affective disorder (HCC)   . Cardiac pacemaker    a. Since age 47 in 5. b. Upgrade to BiV in 2013.  [name] Carpal tunnel syndrome of right wrist   . CHF (congestive heart failure) (HCC)   . Congenital complete AV block   . Depression    bipolar  . Diabetes mellitus without complication (HCC)   . GERD (gastroesophageal reflux disease)   . Hypertension   . Interstitial lung disease (HCC)   . Lupus (HCC)   . Lupus (systemic lupus erythematosus) (HCC)   . Obesity   . Pneumonitis    a. a/w hypoxia - inflammatory - large workup 07/2014.  . Presence of permanent cardiac pacemaker   . Seizures (HCC)    as a child- from high fever  . Sinus tachycardia     ROS:   All systems reviewed and negative except as noted in the HPI.   Past Surgical History:  Procedure Laterality Date  . ATRIAL TACH ABLATION N/A  08/14/2014   Procedure: ATRIAL TACH ABLATION;  Surgeon: Marinus Maw, MD;  Location[facility] CATH LAB;  Service: Cardiovascular;  Laterality: N/A;  . BI-VENTRICULAR PACEMAKER UPGRADE N/A 03/08/2012   Procedure: BI-VENTRICULAR PACEMAKER UPGRADE;  Surgeon: Marinus Maw, MD;  Location: [facility] CATH LAB;  Service: Cardiovascular;  Laterality: N/A;  . CARPAL TUNNEL WITH CUBITAL TUNNEL Right 07/26/2013   Procedure: RIGHT LIMITED OPEN CARPAL TUNNEL RELEASE ,  RIGHT CUBITAL TUNNEL RELEASE, INSITU VERSES ULNAR NERVE DECOMPRESSION AND ANTERIOR TRANSPOSITION;  Surgeon: Dominica Severin, MD;  Location: [facility] OR;  Service: Orthopedics;  Laterality: Right;  . CESAREAN SECTION    . CHOLECYSTECTOMY    . CYST EXCISION  12/10/2012   THYROID  . INSERT / REPLACE / REMOVE PACEMAKER     2001  . IUD REMOVAL  11/03/2011   Procedure: INTRAUTERINE DEVICE (IUD) REMOVAL;  Surgeon: Myra C. Marice Potter, MD;  Location: [facility] ORS;  Service: Gynecology;  Laterality: N/A;  . NASAL FRACTURE SURGERY     /w plate   . RIGHT HEART CATHETERIZATION N/A 10/26/2014   Procedure: RIGHT HEART CATH;  Surgeon: Dolores Patty, MD;  Location: [facility] CATH LAB;  Service: Cardiovascular;  Laterality: N/A;  . THROAT SURGERY  1994   s/p laser treatment  . THYROGLOSSAL DUCT CYST N/A 12/10/2012   Procedure: REVISION OF THYROGLOSSAL DUCT CYST EXCISION;  Surgeon: Serena Colonel, MD;  Location[facility] OR;  Service: ENT;  Laterality: N/A;  Revision of Thyroglossal Duct Cyst Excision  . VIDEO BRONCHOSCOPY Bilateral 06/19/2014   Procedure: VIDEO BRONCHOSCOPY WITHOUT FLUORO;  Surgeon: Kalman Shan, MD;  Location: [facility] ENDOSCOPY;  Service: Cardiopulmonary;  Laterality: Bilateral;     Family History  Problem Relation Age of Onset  . Heart disease Mother        CHF (no details)  . Hypertension Mother   . Lupus Mother   . Heart disease Father        Murmur  . Heart disease Sister 61        No details.  History of a pacemaker     Social History   Socioeconomic History  .  Marital status: Single    Spouse name: Not on file  . Number of children: 1  . Years of education: Not on file  . Highest education level: Not on file  Occupational History  . Occupation: CNA  Social Needs  . Financial resource strain: Not on file  . Food insecurity:  Worry: Not on file    Inability: Not on file  . Transportation needs:    Medical: Not on file    Non-medical: Not on file  Tobacco Use  . Smoking status: Former Smoker    Packs/day: 0.25    Years: 0.50    Pack years: 0.12    Types: Cigarettes    Last attempt to quit: 07/25/1996    Years since quitting: 21.6  . Smokeless tobacco: Never Used  Substance and Sexual Activity  . Alcohol use: No  . Drug use: No  . Sexual activity: Yes    Birth control/protection: Implant  Lifestyle  . Physical activity:    Days per week: Not on file    Minutes per session: Not on file  . Stress: Not on file  Relationships  . Social connections:    Talks on phone: Not on file    Gets together: Not on file    Attends religious service: Not on file    Active member of club or organization: Not on file    Attends meetings of clubs or organizations: Not on file    Relationship status: Not on file  . Intimate partner violence:    Fear of current or ex partner: Not on file    Emotionally abused: Not on file    Physically abused: Not on file    Forced sexual activity: Not on file  Other Topics Concern  . Not on file  Social History Narrative   Works at [employer].       Ht 5\' 5"  (1.651 m)   Wt 137 lb (62.1 kg)   BMI 22.80 kg/m   Physical Exam:  Chronically ill appearing 37 yo woman, NAD HEENT: Unremarkable Neck:  6 cm JVD, no thyromegally Lymphatics:  No adenopathy Back:  No CVA tenderness Lungs:  Scattered rales in the bases. HEART:  Regular tachy rhythm, no murmurs, no rubs, no clicks Abd:  soft, positive bowel sounds, no organomegally, no rebound, no guarding Ext:  2 plus pulses, no edema, no cyanosis, no  clubbing Skin:  No rashes no nodules Neuro:  CN II through XII intact, motor grossly intact  EKG - none  DEVICE  Normal device function.  See PaceArt for details.   Assess/Plan: 1. CHB - she is asymptomatic, s/p PPM insertion.  2. PPM - her Medtronic Biv PPM is working normally. Leads look good. She has about 4months on the device. 3. Sinus tachy vs atrial tachy - I suspect the former due to her lung disease. She requires 4 liters. I wonder if she would tolerate toprol over coreg. I will defer the switching of the meds to Dr. 13month. 4. Chronic systolic heart failure - her pumping function has improved with medical therapy. She will continue her current meds until we hear differently from Dr. Shirlee Latch.  Shirlee Latch.D.

## 2018-04-07 ENCOUNTER — Encounter (HOSPITAL_COMMUNITY): Payer: Self-pay | Admitting: Emergency Medicine

## 2018-04-07 ENCOUNTER — Other Ambulatory Visit: Payer: Self-pay

## 2018-04-07 ENCOUNTER — Inpatient Hospital Stay (HOSPITAL_COMMUNITY)
Admission: EM | Admit: 2018-04-07 | Discharge: 2018-04-09 | DRG: 193 | Disposition: A | Discharge status: Home / Self Care | Payer: Medicare Other | Attending: Family Medicine | Admitting: Family Medicine

## 2018-04-07 ENCOUNTER — Emergency Department (HOSPITAL_COMMUNITY): Payer: Medicare Other

## 2018-04-07 DIAGNOSIS — I2781 Cor pulmonale (chronic): Secondary | ICD-10-CM | POA: Diagnosis present

## 2018-04-07 DIAGNOSIS — Z91048 Other nonmedicinal substance allergy status: Secondary | ICD-10-CM

## 2018-04-07 DIAGNOSIS — Z79899 Other long term (current) drug therapy: Secondary | ICD-10-CM

## 2018-04-07 DIAGNOSIS — J9621 Acute and chronic respiratory failure with hypoxia: Secondary | ICD-10-CM | POA: Diagnosis not present

## 2018-04-07 DIAGNOSIS — K219 Gastro-esophageal reflux disease without esophagitis: Secondary | ICD-10-CM | POA: Diagnosis present

## 2018-04-07 DIAGNOSIS — J9611 Chronic respiratory failure with hypoxia: Secondary | ICD-10-CM | POA: Diagnosis present

## 2018-04-07 DIAGNOSIS — E785 Hyperlipidemia, unspecified: Secondary | ICD-10-CM | POA: Diagnosis present

## 2018-04-07 DIAGNOSIS — Z950 Presence of cardiac pacemaker: Secondary | ICD-10-CM | POA: Diagnosis present

## 2018-04-07 DIAGNOSIS — R0600 Dyspnea, unspecified: Secondary | ICD-10-CM | POA: Diagnosis not present

## 2018-04-07 DIAGNOSIS — R0602 Shortness of breath: Secondary | ICD-10-CM | POA: Diagnosis not present

## 2018-04-07 DIAGNOSIS — E2740 Unspecified adrenocortical insufficiency: Secondary | ICD-10-CM | POA: Diagnosis present

## 2018-04-07 DIAGNOSIS — J849 Interstitial pulmonary disease, unspecified: Secondary | ICD-10-CM | POA: Diagnosis present

## 2018-04-07 DIAGNOSIS — M329 Systemic lupus erythematosus, unspecified: Secondary | ICD-10-CM | POA: Diagnosis present

## 2018-04-07 DIAGNOSIS — Z888 Allergy status to other drugs, medicaments and biological substances status: Secondary | ICD-10-CM

## 2018-04-07 DIAGNOSIS — E1169 Type 2 diabetes mellitus with other specified complication: Secondary | ICD-10-CM | POA: Diagnosis present

## 2018-04-07 DIAGNOSIS — J440 Chronic obstructive pulmonary disease with acute lower respiratory infection: Secondary | ICD-10-CM | POA: Diagnosis not present

## 2018-04-07 DIAGNOSIS — I10 Essential (primary) hypertension: Secondary | ICD-10-CM | POA: Diagnosis not present

## 2018-04-07 DIAGNOSIS — Z794 Long term (current) use of insulin: Secondary | ICD-10-CM

## 2018-04-07 DIAGNOSIS — I110 Hypertensive heart disease with heart failure: Secondary | ICD-10-CM | POA: Diagnosis present

## 2018-04-07 DIAGNOSIS — Z8249 Family history of ischemic heart disease and other diseases of the circulatory system: Secondary | ICD-10-CM

## 2018-04-07 DIAGNOSIS — I5022 Chronic systolic (congestive) heart failure: Secondary | ICD-10-CM | POA: Diagnosis present

## 2018-04-07 DIAGNOSIS — R911 Solitary pulmonary nodule: Secondary | ICD-10-CM | POA: Diagnosis present

## 2018-04-07 DIAGNOSIS — Y95 Nosocomial condition: Secondary | ICD-10-CM | POA: Diagnosis present

## 2018-04-07 DIAGNOSIS — Z87891 Personal history of nicotine dependence: Secondary | ICD-10-CM

## 2018-04-07 DIAGNOSIS — F419 Anxiety disorder, unspecified: Secondary | ICD-10-CM | POA: Diagnosis present

## 2018-04-07 DIAGNOSIS — J189 Pneumonia, unspecified organism: Secondary | ICD-10-CM | POA: Diagnosis not present

## 2018-04-07 DIAGNOSIS — Z9981 Dependence on supplemental oxygen: Secondary | ICD-10-CM

## 2018-04-07 DIAGNOSIS — R000 Tachycardia, unspecified: Secondary | ICD-10-CM | POA: Diagnosis not present

## 2018-04-07 DIAGNOSIS — Z7952 Long term (current) use of systemic steroids: Secondary | ICD-10-CM

## 2018-04-07 DIAGNOSIS — F3170 Bipolar disorder, currently in remission, most recent episode unspecified: Secondary | ICD-10-CM | POA: Diagnosis not present

## 2018-04-07 DIAGNOSIS — R05 Cough: Secondary | ICD-10-CM | POA: Diagnosis not present

## 2018-04-07 HISTORY — DX: Cor pulmonale (chronic): I27.81

## 2018-04-07 LAB — CBC
HEMATOCRIT: 40.4 % (ref 36.0–46.0)
Hemoglobin: 11.8 g/dL — ABNORMAL LOW (ref 12.0–15.0)
MCH: 23.4 pg — ABNORMAL LOW (ref 26.0–34.0)
MCHC: 29.2 g/dL — ABNORMAL LOW (ref 30.0–36.0)
MCV: 80 fL (ref 78.0–100.0)
Platelets: 336 10*3/uL (ref 150–400)
RBC: 5.05 MIL/uL (ref 3.87–5.11)
RDW: 17 % — ABNORMAL HIGH (ref 11.5–15.5)
WBC: 13.8 10*3/uL — AB (ref 4.0–10.5)

## 2018-04-07 LAB — I-STAT BETA HCG BLOOD, ED (MC, WL, AP ONLY): I-stat hCG, quantitative: <5 m[IU]/mL (ref <5)

## 2018-04-07 LAB — I-STAT TROPONIN, ED: TROPONIN I, POC: 0.01 ng/mL (ref 0.00–0.08)

## 2018-04-07 MED ORDER — VANCOMYCIN HCL IN DEXTROSE 1-5 GM/200ML-% IV SOLN
1000.0000 mg | Freq: Once | INTRAVENOUS | Status: AC
  Administered 2018-04-08: 1000 mg via INTRAVENOUS
  Filled 2018-04-07: qty 200

## 2018-04-07 MED ORDER — PIPERACILLIN-TAZOBACTAM 3.375 G IVPB 30 MIN
3.3750 g | Freq: Once | INTRAVENOUS | Status: AC
  Administered 2018-04-08: 3.375 g via INTRAVENOUS
  Filled 2018-04-07: qty 50

## 2018-04-07 MED ORDER — SODIUM CHLORIDE 0.9 % IV BOLUS
250.0000 mL | Freq: Once | INTRAVENOUS | Status: AC
  Administered 2018-04-08: 250 mL via INTRAVENOUS

## 2018-04-07 NOTE — ED Triage Notes (Signed)
Pt brought in by EMS due to shortness of breath and fast heart rate. Pt has had SOB and cough for 2 weeks and recently felt more SOB today. Fire reports initial sats in 70's on 4L O2, which is what she wears at home. EMS gave 10mg  albuterol, 0.5 atrovent, 125mg  Solumedrol. Pt takes a beta blocker but has not taken any in 4 days due to coughing it up. Pt currently on non  rebreather

## 2018-04-07 NOTE — ED Provider Notes (Signed)
[facility] EMERGENCY DEPARTMENT Provider Note   CSN: [id number] Arrival date & time: 04/07/18  2237     History   Chief Complaint Chief Complaint  Patient presents with  . Shortness of Breath    HPI Makayla Vasquez is a 37 y.o. female.  37 yo F with a chief complaint of shortness of breath.  This been going on for the past couple weeks.  Cough congestion and subjective chills.  Denies sick contacts.  Has had no improvement with breathing treatments at home.  Feels that her heart is been racing on her.  Worsening today and so came to the emergency department.  Denies history of PE or DVT.  Has a history of interstitial lung disease and lupus is on 4 L oxygen at home.  Per EMS the patient was satting 70% on 4 L.  The history is provided by the patient.  Shortness of Breath  This is a new problem. The average episode lasts 2 days. The problem occurs continuously.The current episode started more than 2 days ago. The problem has not changed since onset.Pertinent negatives include no fever, no headaches, no rhinorrhea, no wheezing, no chest pain and no vomiting. She has tried nothing for the symptoms. The treatment provided no relief. She has had prior hospitalizations. She has had prior ED visits. Associated medical issues include COPD, chronic lung disease and heart failure. Associated medical issues do not include past MI.    Past Medical History:  Diagnosis Date  . Anxiety   . Arthritis    rheumatoid arthritis- mild, no rheumatology care   . Asthma    seasonal allergies   . Asymptomatic LV dysfunction    a. Echo in Dec 2011 with EF 35 to 40%. Felt to be due to paced rhythm. b. EF 25-30% in 07/2014.  . Bipolar affective disorder (HCC)   . Cardiac pacemaker    a. Since age 32 in 13. b. Upgrade to BiV in 2013.  [name] Carpal tunnel syndrome of right wrist   . CHF (congestive heart failure) (HCC)   . Congenital complete AV block   . Depression    bipolar  .  Diabetes mellitus without complication (HCC)   . GERD (gastroesophageal reflux disease)   . Hypertension   . Interstitial lung disease (HCC)   . Lupus (HCC)   . Lupus (systemic lupus erythematosus) (HCC)   . Obesity   . Pneumonitis    a. a/w hypoxia - inflammatory - large workup 07/2014.  . Presence of permanent cardiac pacemaker   . Seizures (HCC)    as a child- from high fever  . Sinus tachycardia     Patient Active Problem List   Diagnosis Date Noted  . Abnormal uterine bleeding 02/14/2018  . Concern about breast cancer in female without diagnosis 08/25/2017  . Dyspareunia, female 05/14/2017  . 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 On Cellcept therapy   . On prednisone therapy   . Systemic lupus erythematosus (HCC)   . Long term (current) use of systemic steroids   . Chronic diastolic congestive heart failure (HCC)   . Cough   . Anemia of chronic disease   . Bipolar affective disorder in remission (HCC)   . Cardiac pacemaker in situ   . Adrenal insufficiency (HCC)   . Antisynthetase syndrome (HCC)   . Idiopathic hypotension 07/08/2015  . Hyperlipidemia associated with type 2 diabetes mellitus (HCC) 05/04/2015  . Screening for cervical cancer 01/19/2015  . Diabetes mellitus  type 2 in obese (HCC)   . Chronic respiratory failure with hypoxia (HCC) 08/07/2014  . Insertion of implantable subdermal contraceptive 07/12/2014  . ILD (interstitial lung disease) (HCC) 06/19/2014  . Congestive dilated cardiomyopathy (HCC) 05/31/2014  . Chronic systolic heart failure (HCC) 02/26/2012  . PPM-Medtronic   . Congenital complete AV block   . GERD (gastroesophageal reflux disease)   . THYROGLOSSAL DUCT CYST 07/18/2010  . POLYCYSTIC OVARY 12/17/2006    Past Surgical History:  Procedure Laterality Date  . ATRIAL TACH ABLATION N/A 08/14/2014   Procedure: ATRIAL TACH ABLATION;  Surgeon: Marinus Maw, MD;  Location: [facility] CATH LAB;  Service: Cardiovascular;  Laterality: N/A;  .  BI-VENTRICULAR PACEMAKER UPGRADE N/A 03/08/2012   Procedure: BI-VENTRICULAR PACEMAKER UPGRADE;  Surgeon: Marinus Maw, MD;  Location: [facility] CATH LAB;  Service: Cardiovascular;  Laterality: N/A;  . CARPAL TUNNEL WITH CUBITAL TUNNEL Right 07/26/2013   Procedure: RIGHT LIMITED OPEN CARPAL TUNNEL RELEASE ,  RIGHT CUBITAL TUNNEL RELEASE, INSITU VERSES ULNAR NERVE DECOMPRESSION AND ANTERIOR TRANSPOSITION;  Surgeon: Dominica Severin, MD;  Location: [facility] OR;  Service: Orthopedics;  Laterality: Right;  . CESAREAN SECTION    . CHOLECYSTECTOMY    . CYST EXCISION  12/10/2012   THYROID  . INSERT / REPLACE / REMOVE PACEMAKER     2001  . IUD REMOVAL  11/03/2011   Procedure: INTRAUTERINE DEVICE (IUD) REMOVAL;  Surgeon: Myra C. Marice Potter, MD;  Location: [facility] ORS;  Service: Gynecology;  Laterality: N/A;  . NASAL FRACTURE SURGERY     /w plate   . RIGHT HEART CATHETERIZATION N/A 10/26/2014   Procedure: RIGHT HEART CATH;  Surgeon: Dolores Patty, MD;  Location: [facility] CATH LAB;  Service: Cardiovascular;  Laterality: N/A;  . THROAT SURGERY  1994   s/p laser treatment  . THYROGLOSSAL DUCT CYST N/A 12/10/2012   Procedure: REVISION OF THYROGLOSSAL DUCT CYST EXCISION;  Surgeon: Serena Colonel, MD;  Location: [facility] OR;  Service: ENT;  Laterality: N/A;  Revision of Thyroglossal Duct Cyst Excision  . VIDEO BRONCHOSCOPY Bilateral 06/19/2014   Procedure: VIDEO BRONCHOSCOPY WITHOUT FLUORO;  Surgeon: Kalman Shan, MD;  Location: [name] ENDOSCOPY;  Service: Cardiopulmonary;  Laterality: Bilateral;     OB History    Gravida  1   Para  1   Term  1   Preterm      AB      Living  1     SAB      TAB      Ectopic      Multiple      Live Births               Home Medications    Prior to Admission medications   Medication Sig Start Date End Date Taking? Authorizing Provider  calcium-vitamin D (OSCAL WITH D) 500-200 MG-UNIT TABS tablet Take 1 tablet by mouth 2 (two) times daily with a meal. 09/03/17   [provider]    carvedilol (COREG) 3.125 MG tablet Take 1 tablet (3.125 mg total) by mouth 2 (two) times daily. 11/19/17 02/24/19  Clegg, Amy D, NP  dicyclomine (BENTYL) 20 MG tablet Take 20 mg by mouth every 6 (six) hours.    [provider]  EASY TOUCH PEN NEEDLES 31G X 8 MM MISC USE AS DIRECTED 08/14/17   Marquette Saa, MD  Etonogestrel [facility]) Inject into the skin once.     [provider]  furosemide (LASIX) 40 MG tablet Take 1 tablet (40 mg  total) by mouth daily. Patient taking differently: Take 20 mg by mouth daily.  02/23/18   Laurey Morale, MD  LANTUS SOLOSTAR 100 UNIT/ML Solostar Pen INJECT 15 UNITS SUBCUTANEOUSLY ONCE DAILY Patient taking differently: INJECT 27 UNITS SUBCUTANEOUSLY ONCE DAILY 05/15/17   Marquette Saa, MD  loratadine (CLARITIN) 10 MG tablet Take 1 tablet (10 mg total) by mouth daily. 06/26/17   Arvilla Market, DO  metFORMIN (GLUCOPHAGE) 1000 MG tablet Take 1 tablet (1,000 mg total) by mouth 2 (two) times daily with a meal. 02/01/18   Marquette Saa, MD  mycophenolate (CELLCEPT) 500 MG tablet Take 1,000 mg by mouth 2 (two) times daily.     [provider]  NOVOLOG FLEXPEN 100 UNIT/ML FlexPen INJECT 8 UNITS INTO THE SKIN 3 (THREE) TIMES DAILY WITH MEALS. 09/30/17   Marquette Saa, MD  OXYGEN Inhale 4 L into the lungs.    [provider]  pantoprazole (PROTONIX) 40 MG tablet TAKE 1 TABLET (40MG  TOTAL) BY MOUTH DAILY 05/15/17   Marquette Saa, MD  potassium chloride SA (KLOR-CON M20) 20 MEQ tablet Take 2 tablets (40 mEq total) by mouth daily. 02/23/18   Laurey Morale, MD  predniSONE (DELTASONE) 50 MG tablet Take 25 mg by mouth daily with breakfast.    [provider]  ranitidine (ZANTAC) 150 MG tablet Take 150 mg by mouth 2 (two) times daily.    [provider]  sulfamethoxazole-trimethoprim (BACTRIM,SEPTRA) 400-80 MG tablet Take 1 tablet by mouth every other day.  03/11/18 06/09/18  [provider]  tacrolimus (PROGRAF) 0.5 MG capsule Take 1.5 mg by mouth 2 (two) times daily. 11/27/17   [provider]    Family History Family History  Problem Relation Age of Onset  . Heart disease Mother        CHF (no details)  . Hypertension Mother   . Lupus Mother   . Heart disease Father        Murmur  . Heart disease Sister 33        No details.  History of a pacemaker    Social History Social History   Tobacco Use  . Smoking status: Former Smoker    Packs/day: 0.25    Years: 0.50    Pack years: 0.12    Types: Cigarettes    Last attempt to quit: 07/25/1996    Years since quitting: 21.7  . Smokeless tobacco: Never Used  Substance Use Topics  . Alcohol use: No  . Drug use: No     Allergies   Atorvastatin; Sertraline hcl; and Tape   Review of Systems Review of Systems  Constitutional: Negative for chills and fever.  HENT: Negative for congestion and rhinorrhea.   Eyes: Negative for redness and visual disturbance.  Respiratory: Positive for shortness of breath. Negative for wheezing.   Cardiovascular: Negative for chest pain and palpitations.  Gastrointestinal: Negative for nausea and vomiting.  Genitourinary: Negative for dysuria and urgency.  Musculoskeletal: Negative for arthralgias and myalgias.  Skin: Negative for pallor and wound.  Neurological: Negative for dizziness and headaches.     Physical Exam Updated Vital Signs BP 99/67   Pulse (!) 110   Temp 99.5 F (37.5 C)   Resp (!) 26   Ht 5\' 4"  (1.626 m)   Wt 62.1 kg (137 lb)   SpO2 99%   BMI 23.52 kg/m   Physical Exam  Constitutional: She is oriented to person, place, and time. She appears well-developed and  well-nourished. No distress.  HENT:  Head: Normocephalic and atraumatic.  Eyes: Pupils are equal, round, and reactive to light. EOM are normal.  Neck: Normal range of motion. Neck supple. No JVD present.  JVP just above the clavicles bilaterally    Cardiovascular: Normal rate and regular rhythm. Exam reveals no gallop and no friction rub.  No murmur heard. Pulmonary/Chest: Effort normal. She has no wheezes. She has no rales.  Tachypnea  Abdominal: Soft. She exhibits no distension. There is no tenderness.  Musculoskeletal: She exhibits no edema or tenderness.  Neurological: She is alert and oriented to person, place, and time.  Skin: Skin is warm. She is diaphoretic.  Psychiatric: She has a normal mood and affect. Her behavior is normal.  Nursing note and vitals reviewed.    ED Treatments / Results  Labs (all labs ordered are listed, but only abnormal results are displayed) Labs Reviewed  BASIC METABOLIC PANEL - Abnormal; Notable for the following components:      Result Value   Glucose, Bld 219 (*)    Calcium 8.8 (*)    All other components within normal limits  CBC - Abnormal; Notable for the following components:   WBC 13.8 (*)    Hemoglobin 11.8 (*)    MCH 23.4 (*)    MCHC 29.2 (*)    RDW 17.0 (*)    All other components within normal limits  CULTURE, BLOOD (ROUTINE X 2)  CULTURE, BLOOD (ROUTINE X 2)  BRAIN NATRIURETIC PEPTIDE  I-STAT TROPONIN, ED  I-STAT BETA HCG BLOOD, ED ([facility], [facility], [facility] ONLY)  I-STAT CG4 LACTIC ACID, ED  I-STAT CG4 LACTIC ACID, ED    EKG EKG Interpretation  Date/Time:  Wednesday April 07 2018 22:49:01 EDT Ventricular Rate:  146 PR Interval:    QRS Duration: 99 QT Interval:  305 QTC Calculation: 476 R Axis:   -165 Text Interpretation:  Sinus tachycardia Probable left atrial enlargement Low voltage, precordial leads Anterolateral Q wave, probably normal for age No significant change since last tracing Confirmed by Melene Plan [phone]) on 04/07/2018 11:05:06 PM   Radiology Ct Angio Chest Pe W And/or Wo Contrast  Result Date: 04/08/2018 CLINICAL DATA:  Dyspnea and tachycardia x2 weeks with cough. EXAM: CT ANGIOGRAPHY CHEST WITH CONTRAST TECHNIQUE: Multidetector CT imaging of the chest was  performed using the standard protocol during bolus administration of intravenous contrast. Multiplanar CT image reconstructions and MIPs were obtained to evaluate the vascular anatomy. CONTRAST:  ISOVUE-370 IOPAMIDOL (ISOVUE-370) INJECTION 76% COMPARISON:  12/01/2017 CT, 02/22/2016 CT, 04/07/2018 CXR FINDINGS: Cardiovascular: Stable cardiomegaly. Dilatation of the main pulmonary artery to 3.5 cm compatible with chronic pulmonary hypertension. Satisfactory opacification of the pulmonary arteries the segmental level without pulmonary embolus. Nonaneurysmal atherosclerotic aorta. No pericardial effusion or thickening. Right atrial, coronary sinus and right ventricular pacing leads noted with anterior left chest wall pacemaker apparatus noted. Mediastinum/Nodes: The visualized thyroid gland is unremarkable. Prevascular and paratracheal mildly enlarged lymph nodes are noted up to 1.1 cm short axis, nonspecific but may represent reactive adenopathy. No axillary or hilar adenopathy. Patent trachea and mainstem bronchi. Nondistended esophagus. Lungs/Pleura: Redemonstration of diffuse reticulonodular interstitial lung disease with superimposed multiple ground-glass opacities throughout both lungs. Subpleural Kerley B lines are suggested as well. Findings would be in keeping with a degree of chronic congestive heart failure. Superimposed pneumonia would be difficult to entirely exclude. Stable 6 mm left lower lobe pulmonary nodule dating back to 2017. Upper Abdomen: No acute abnormality. Musculoskeletal: No aggressive osseous lesions.  Review of the MIP images confirms the above findings. IMPRESSION: 1. Redemonstration of cardiomegaly with diffuse pulmonary vascular congestion, interstitial edema and superimposed ground-glass opacities. Findings likely reflect component of chronic CHF. Superimposed pneumonia would be difficult to entirely exclude. 2. Stable 6 mm left lower lobe pulmonary nodule dating back to 2017  favoring a benign etiology. 3. Dilated main pulmonary artery consistent with chronic pulmonary hypertension. No acute pulmonary embolus. Electronically Signed   By: Tollie Eth M.D.   On: 04/08/2018 02:05   Dg Chest Port 1 View  Result Date: 04/07/2018 CLINICAL DATA:  Shortness of breath and fast heart rate. Cough for 2 weeks. More short of breath today. EXAM: PORTABLE CHEST 1 VIEW COMPARISON:  02/23/2018 FINDINGS: Cardiac pacemaker. Shallow inspiration. Mild cardiac enlargement without vascular congestion. There is basilar interstitial infiltration or edema which appears to be improving since previous study. No pleural effusions. No pneumothorax. Mediastinal contours appear intact. IMPRESSION: Improving interstitial infiltration or edema in the lung bases since previous study. Mild cardiac enlargement without vascular congestion. Electronically Signed   By: Burman Nieves M.D.   On: 04/07/2018 23:30    Procedures Procedures (including critical care time)  Medications Ordered in ED Medications  iopamidol (ISOVUE-370) 76 % injection (has no administration in time range)  sodium chloride 0.9 % bolus 250 mL (0 mLs Intravenous Stopped 04/08/18 0125)  vancomycin (VANCOCIN) IVPB 1000 mg/200 mL premix (0 mg Intravenous Stopped 04/08/18 0226)  piperacillin-tazobactam (ZOSYN) IVPB 3.375 g (0 g Intravenous Stopped 04/08/18 0125)  iopamidol (ISOVUE-370) 76 % injection 100 mL (100 mLs Intravenous Contrast Given 04/08/18 0136)     Initial Impression / Assessment and Plan / ED Course  I have reviewed the triage vital signs and the nursing notes.  Pertinent labs & imaging results that were available during my care of the patient were reviewed by me and considered in my medical decision making (see chart for details).     37 yo F with a chief complaint of shortness of breath.  Patient has some significant tachypnea diaphoresis on my initial exam.  Heart rate into the 140s.  With her history of heart failure  she is given a small fluid bolus she did not appear fluid overloaded.  Chest x-ray reviewed by me with interstitial lung disease with some improvement of aeration from prior.  With no specific cause found a CT scan to rule out PE was ordered.  This was negative for PE.  There is no specific finding of pneumonia but it could not be ruled out.  She was given broad-spectrum antibiotics that she is chronically immunosuppressed.  Patient is doing much better on reassessment heart rate is still tachycardic.  She is back on her 4 L of oxygen at home.  I discussed with her PCP for admission.  CRITICAL CARE Performed by: Rae Roam   Total critical care time: 80 minutes  Critical care time was exclusive of separately billable procedures and treating other patients.  Critical care was necessary to treat or prevent imminent or life-threatening deterioration.  Critical care was time spent personally by me on the following activities: development of treatment plan with patient and/or surrogate as well as nursing, discussions with consultants, evaluation of patient's response to treatment, examination of patient, obtaining history from patient or surrogate, ordering and performing treatments and interventions, ordering and review of laboratory studies, ordering and review of radiographic studies, pulse oximetry and re-evaluation of patient's condition.  The patients results and plan were reviewed and discussed.  Any x-rays performed were independently reviewed by myself.   Differential diagnosis were considered with the presenting HPI.  Medications  iopamidol (ISOVUE-370) 76 % injection (has no administration in time range)  sodium chloride 0.9 % bolus 250 mL (0 mLs Intravenous Stopped 04/08/18 0125)  vancomycin (VANCOCIN) IVPB 1000 mg/200 mL premix (0 mg Intravenous Stopped 04/08/18 0226)  piperacillin-tazobactam (ZOSYN) IVPB 3.375 g (0 g Intravenous Stopped 04/08/18 0125)  iopamidol (ISOVUE-370)  76 % injection 100 mL (100 mLs Intravenous Contrast Given 04/08/18 0136)    Vitals:   04/08/18 0000 04/08/18 0030 04/08/18 0045 04/08/18 0115  BP: 108/77 111/78 104/77 99/67  Pulse: (!) 128 (!) 121 (!) 115 (!) 110  Resp: (!) 30 (!) 26    Temp:      SpO2: 99% 98% 99% 99%  Weight:      Height:        Final diagnoses:  HCAP (healthcare-associated pneumonia)    Admission/ observation were discussed with the admitting physician, patient and/or family and they are comfortable with the plan.   Final Clinical Impressions(s) / ED Diagnoses   Final diagnoses:  HCAP (healthcare-associated pneumonia)    ED Discharge Orders    None       Melene Plan, DO 04/08/18 0240

## 2018-04-08 ENCOUNTER — Other Ambulatory Visit: Payer: Self-pay

## 2018-04-08 ENCOUNTER — Encounter (HOSPITAL_COMMUNITY): Payer: Self-pay | Admitting: Family Medicine

## 2018-04-08 ENCOUNTER — Ambulatory Visit: Payer: Medicare Other | Admitting: Adult Health

## 2018-04-08 ENCOUNTER — Emergency Department (HOSPITAL_COMMUNITY): Payer: Medicare Other

## 2018-04-08 DIAGNOSIS — Z9981 Dependence on supplemental oxygen: Secondary | ICD-10-CM | POA: Diagnosis not present

## 2018-04-08 DIAGNOSIS — R0600 Dyspnea, unspecified: Secondary | ICD-10-CM | POA: Diagnosis not present

## 2018-04-08 DIAGNOSIS — F3170 Bipolar disorder, currently in remission, most recent episode unspecified: Secondary | ICD-10-CM | POA: Diagnosis present

## 2018-04-08 DIAGNOSIS — Z8249 Family history of ischemic heart disease and other diseases of the circulatory system: Secondary | ICD-10-CM | POA: Diagnosis not present

## 2018-04-08 DIAGNOSIS — J849 Interstitial pulmonary disease, unspecified: Secondary | ICD-10-CM | POA: Diagnosis not present

## 2018-04-08 DIAGNOSIS — Z7952 Long term (current) use of systemic steroids: Secondary | ICD-10-CM | POA: Diagnosis not present

## 2018-04-08 DIAGNOSIS — I110 Hypertensive heart disease with heart failure: Secondary | ICD-10-CM | POA: Diagnosis present

## 2018-04-08 DIAGNOSIS — K219 Gastro-esophageal reflux disease without esophagitis: Secondary | ICD-10-CM | POA: Diagnosis present

## 2018-04-08 DIAGNOSIS — I2781 Cor pulmonale (chronic): Secondary | ICD-10-CM

## 2018-04-08 DIAGNOSIS — Z87891 Personal history of nicotine dependence: Secondary | ICD-10-CM | POA: Diagnosis not present

## 2018-04-08 DIAGNOSIS — E1169 Type 2 diabetes mellitus with other specified complication: Secondary | ICD-10-CM | POA: Diagnosis present

## 2018-04-08 DIAGNOSIS — E2740 Unspecified adrenocortical insufficiency: Secondary | ICD-10-CM | POA: Diagnosis not present

## 2018-04-08 DIAGNOSIS — J9611 Chronic respiratory failure with hypoxia: Secondary | ICD-10-CM

## 2018-04-08 DIAGNOSIS — J189 Pneumonia, unspecified organism: Secondary | ICD-10-CM | POA: Diagnosis present

## 2018-04-08 DIAGNOSIS — Z950 Presence of cardiac pacemaker: Secondary | ICD-10-CM

## 2018-04-08 DIAGNOSIS — F419 Anxiety disorder, unspecified: Secondary | ICD-10-CM | POA: Diagnosis present

## 2018-04-08 DIAGNOSIS — Z79899 Other long term (current) drug therapy: Secondary | ICD-10-CM | POA: Diagnosis not present

## 2018-04-08 DIAGNOSIS — Y95 Nosocomial condition: Secondary | ICD-10-CM | POA: Diagnosis present

## 2018-04-08 DIAGNOSIS — M329 Systemic lupus erythematosus, unspecified: Secondary | ICD-10-CM | POA: Diagnosis present

## 2018-04-08 DIAGNOSIS — E785 Hyperlipidemia, unspecified: Secondary | ICD-10-CM | POA: Diagnosis present

## 2018-04-08 DIAGNOSIS — Z888 Allergy status to other drugs, medicaments and biological substances status: Secondary | ICD-10-CM | POA: Diagnosis not present

## 2018-04-08 DIAGNOSIS — J9621 Acute and chronic respiratory failure with hypoxia: Secondary | ICD-10-CM | POA: Diagnosis present

## 2018-04-08 DIAGNOSIS — R0602 Shortness of breath: Secondary | ICD-10-CM | POA: Diagnosis not present

## 2018-04-08 DIAGNOSIS — R000 Tachycardia, unspecified: Secondary | ICD-10-CM | POA: Diagnosis not present

## 2018-04-08 DIAGNOSIS — J440 Chronic obstructive pulmonary disease with acute lower respiratory infection: Secondary | ICD-10-CM | POA: Diagnosis present

## 2018-04-08 DIAGNOSIS — R911 Solitary pulmonary nodule: Secondary | ICD-10-CM | POA: Diagnosis present

## 2018-04-08 DIAGNOSIS — Z794 Long term (current) use of insulin: Secondary | ICD-10-CM | POA: Diagnosis not present

## 2018-04-08 DIAGNOSIS — Z91048 Other nonmedicinal substance allergy status: Secondary | ICD-10-CM | POA: Diagnosis not present

## 2018-04-08 DIAGNOSIS — M3213 Lung involvement in systemic lupus erythematosus: Secondary | ICD-10-CM | POA: Diagnosis not present

## 2018-04-08 DIAGNOSIS — I5022 Chronic systolic (congestive) heart failure: Secondary | ICD-10-CM | POA: Diagnosis present

## 2018-04-08 HISTORY — DX: Cor pulmonale (chronic): I27.81

## 2018-04-08 LAB — BASIC METABOLIC PANEL
ANION GAP: 14 (ref 5–15)
Anion gap: 11 (ref 5–15)
BUN: 12 mg/dL (ref 6–20)
BUN: 12 mg/dL (ref 6–20)
CALCIUM: 8.8 mg/dL — AB (ref 8.9–10.3)
CHLORIDE: 101 mmol/L (ref 101–111)
CO2: 22 mmol/L (ref 22–32)
CO2: 21 mmol/L — ABNORMAL LOW (ref 22–32)
Calcium: 8.4 mg/dL — ABNORMAL LOW (ref 8.9–10.3)
Chloride: 102 mmol/L (ref 101–111)
Creatinine, Ser: 0.79 mg/dL (ref 0.44–1.00)
Creatinine, Ser: 0.9 mg/dL (ref 0.44–1.00)
GFR calc non Af Amer: >60 mL/min (ref >60)
GFR calc non Af Amer: >60 mL/min (ref >60)
Glucose, Bld: 219 mg/dL — ABNORMAL HIGH (ref 65–99)
Glucose, Bld: 393 mg/dL — ABNORMAL HIGH (ref 65–99)
POTASSIUM: 4.4 mmol/L (ref 3.5–5.1)
Potassium: 3.8 mmol/L (ref 3.5–5.1)
SODIUM: 138 mmol/L (ref 135–145)
Sodium: 133 mmol/L — ABNORMAL LOW (ref 135–145)

## 2018-04-08 LAB — GLUCOSE, CAPILLARY
GLUCOSE-CAPILLARY: 381 mg/dL — AB (ref 65–99)
GLUCOSE-CAPILLARY: 277 mg/dL — AB (ref 65–99)
GLUCOSE-CAPILLARY: 212 mg/dL — AB (ref 65–99)
GLUCOSE-CAPILLARY: 176 mg/dL — AB (ref 65–99)
Glucose-Capillary: 294 mg/dL — ABNORMAL HIGH (ref 65–99)

## 2018-04-08 LAB — CBC
HEMATOCRIT: 35.5 % — AB (ref 36.0–46.0)
HEMOGLOBIN: 10.6 g/dL — AB (ref 12.0–15.0)
MCH: 24 pg — ABNORMAL LOW (ref 26.0–34.0)
MCHC: 29.9 g/dL — ABNORMAL LOW (ref 30.0–36.0)
MCV: 80.3 fL (ref 78.0–100.0)
Platelets: 273 10*3/uL (ref 150–400)
RBC: 4.42 MIL/uL (ref 3.87–5.11)
RDW: 16.8 % — ABNORMAL HIGH (ref 11.5–15.5)
WBC: 7.7 10*3/uL (ref 4.0–10.5)

## 2018-04-08 LAB — BRAIN NATRIURETIC PEPTIDE: B Natriuretic Peptide: 15.7 pg/mL (ref 0.0–100.0)

## 2018-04-08 LAB — I-STAT CG4 LACTIC ACID, ED: LACTIC ACID, VENOUS: 1.3 mmol/L (ref 0.5–1.9)

## 2018-04-08 LAB — MRSA PCR SCREENING: MRSA by PCR: NEGATIVE

## 2018-04-08 MED ORDER — INSULIN GLARGINE 100 UNIT/ML ~~LOC~~ SOLN
14.0000 [IU] | Freq: Every day | SUBCUTANEOUS | Status: DC
  Administered 2018-04-08 – 2018-04-09 (×2): 14 [IU] via SUBCUTANEOUS
  Filled 2018-04-08 (×2): qty 0.14

## 2018-04-08 MED ORDER — MYCOPHENOLATE MOFETIL 250 MG PO CAPS
1000.0000 mg | ORAL_CAPSULE | Freq: Two times a day (BID) | ORAL | Status: DC
  Administered 2018-04-08 – 2018-04-09 (×3): 1000 mg via ORAL
  Filled 2018-04-08 (×3): qty 4

## 2018-04-08 MED ORDER — PREDNISONE 10 MG PO TABS
10.0000 mg | ORAL_TABLET | Freq: Every day | ORAL | Status: DC
  Administered 2018-04-08: 10 mg via ORAL
  Filled 2018-04-08: qty 1

## 2018-04-08 MED ORDER — PREDNISONE 20 MG PO TABS
30.0000 mg | ORAL_TABLET | Freq: Every day | ORAL | Status: DC
  Administered 2018-04-09: 30 mg via ORAL
  Filled 2018-04-08: qty 1

## 2018-04-08 MED ORDER — PREDNISONE 20 MG PO TABS: 20.0000 mg | ORAL_TABLET | Freq: Every day | ORAL | Status: DC

## 2018-04-08 MED ORDER — IBUPROFEN 400 MG PO TABS
400.0000 mg | ORAL_TABLET | Freq: Four times a day (QID) | ORAL | Status: DC | PRN
  Administered 2018-04-08: 400 mg via ORAL
  Filled 2018-04-08: qty 1

## 2018-04-08 MED ORDER — IPRATROPIUM-ALBUTEROL 0.5-2.5 (3) MG/3ML IN SOLN: 3.0000 mL | RESPIRATORY_TRACT | Status: DC | PRN

## 2018-04-08 MED ORDER — ENOXAPARIN SODIUM 40 MG/0.4ML ~~LOC~~ SOLN
40.0000 mg | SUBCUTANEOUS | Status: DC
  Administered 2018-04-08: 40 mg via SUBCUTANEOUS
  Filled 2018-04-08: qty 0.4

## 2018-04-08 MED ORDER — INSULIN ASPART 100 UNIT/ML ~~LOC~~ SOLN
0.0000 [IU] | Freq: Three times a day (TID) | SUBCUTANEOUS | Status: DC
  Administered 2018-04-08: 9 [IU] via SUBCUTANEOUS
  Administered 2018-04-08: 3 [IU] via SUBCUTANEOUS
  Administered 2018-04-08: 5 [IU] via SUBCUTANEOUS
  Administered 2018-04-09: 2 [IU] via SUBCUTANEOUS
  Administered 2018-04-09: 5 [IU] via SUBCUTANEOUS

## 2018-04-08 MED ORDER — RAMELTEON 8 MG PO TABS
8.0000 mg | ORAL_TABLET | Freq: Every day | ORAL | Status: DC
  Filled 2018-04-08: qty 1

## 2018-04-08 MED ORDER — ACETAMINOPHEN 325 MG PO TABS: 650.0000 mg | ORAL_TABLET | Freq: Four times a day (QID) | ORAL | Status: DC | PRN

## 2018-04-08 MED ORDER — TACROLIMUS 1 MG PO CAPS
1.5000 mg | ORAL_CAPSULE | Freq: Two times a day (BID) | ORAL | Status: DC
  Administered 2018-04-08 – 2018-04-09 (×3): 1.5 mg via ORAL
  Filled 2018-04-08 (×3): qty 1

## 2018-04-08 MED ORDER — FAMOTIDINE 20 MG PO TABS
20.0000 mg | ORAL_TABLET | Freq: Two times a day (BID) | ORAL | Status: DC
  Administered 2018-04-08 – 2018-04-09 (×3): 20 mg via ORAL
  Filled 2018-04-08 (×3): qty 1

## 2018-04-08 MED ORDER — CARVEDILOL 3.125 MG PO TABS
3.1250 mg | ORAL_TABLET | Freq: Two times a day (BID) | ORAL | Status: DC
  Administered 2018-04-08 – 2018-04-09 (×3): 3.125 mg via ORAL
  Filled 2018-04-08 (×3): qty 1

## 2018-04-08 MED ORDER — UREA 10 % EX LOTN
TOPICAL_LOTION | Freq: Every day | CUTANEOUS | Status: DC
  Administered 2018-04-08 – 2018-04-09 (×2): via TOPICAL
  Filled 2018-04-08: qty 237

## 2018-04-08 MED ORDER — IOPAMIDOL (ISOVUE-370) INJECTION 76%
100.0000 mL | Freq: Once | INTRAVENOUS | Status: AC | PRN
  Administered 2018-04-08: 100 mL via INTRAVENOUS

## 2018-04-08 MED ORDER — DICYCLOMINE HCL 20 MG PO TABS
20.0000 mg | ORAL_TABLET | Freq: Four times a day (QID) | ORAL | Status: DC
  Administered 2018-04-08 – 2018-04-09 (×3): 20 mg via ORAL
  Filled 2018-04-08 (×3): qty 1

## 2018-04-08 MED ORDER — SENNOSIDES-DOCUSATE SODIUM 8.6-50 MG PO TABS: 1.0000 | ORAL_TABLET | Freq: Every evening | ORAL | Status: DC | PRN

## 2018-04-08 MED ORDER — PREDNISONE 20 MG PO TABS
20.0000 mg | ORAL_TABLET | Freq: Once | ORAL | Status: AC
  Administered 2018-04-08: 20 mg via ORAL
  Filled 2018-04-08: qty 2

## 2018-04-08 MED ORDER — SODIUM CHLORIDE 0.9 % IV SOLN
INTRAVENOUS | Status: DC | PRN
  Administered 2018-04-08: 17:00:00 via INTRAVENOUS

## 2018-04-08 MED ORDER — FUROSEMIDE 20 MG PO TABS
20.0000 mg | ORAL_TABLET | Freq: Every day | ORAL | Status: DC
  Administered 2018-04-08 – 2018-04-09 (×2): 20 mg via ORAL
  Filled 2018-04-08 (×2): qty 1

## 2018-04-08 MED ORDER — IOPAMIDOL (ISOVUE-370) INJECTION 76%
INTRAVENOUS | Status: AC
  Filled 2018-04-08: qty 100

## 2018-04-08 MED ORDER — LORATADINE 10 MG PO TABS
10.0000 mg | ORAL_TABLET | Freq: Every day | ORAL | Status: DC
  Administered 2018-04-08 – 2018-04-09 (×2): 10 mg via ORAL
  Filled 2018-04-08 (×2): qty 1

## 2018-04-08 MED ORDER — ACETAMINOPHEN 650 MG RE SUPP: 650.0000 mg | Freq: Four times a day (QID) | RECTAL | Status: DC | PRN

## 2018-04-08 MED ORDER — PIPERACILLIN-TAZOBACTAM 3.375 G IVPB
3.3750 g | Freq: Three times a day (TID) | INTRAVENOUS | Status: DC
  Administered 2018-04-08: 3.375 g via INTRAVENOUS
  Filled 2018-04-08 (×4): qty 50

## 2018-04-08 NOTE — Progress Notes (Signed)
ANTIBIOTIC CONSULT NOTE - INITIAL  Pharmacy Consult for Zosyn Indication: HCAP  Allergies  Allergen Reactions  . 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 Sertraline Hcl Hives  . Tape Other (See Comments)    Burns skin    Patient Measurements: Height: 5\' 5"  (165.1 cm) Weight: 136 lb 11 oz (62 kg) IBW/kg (Calculated) : 57   Vital Signs: Temp: 98.6 F (37 C) (06/20 0530) Temp Source: Oral (06/20 0530) BP: 111/70 (06/20 0933) Pulse Rate: 88 (06/20 0933) Intake/Output from previous day: 06/19 0701 - 06/20 0700 In: 451.2 [IV Piggyback:451.2] Out: -  Intake/Output from this shift: No intake/output data recorded.  Labs: Recent Labs    04/07/18 2304 04/08/18 0745  WBC 13.8* 7.7  HGB 11.8* 10.6*  PLT 336 273  CREATININE 0.79 0.90   Estimated Creatinine Clearance: 77 mL/min (by C-G formula based on SCr of 0.9 mg/dL). No results for input(s): VANCOTROUGH, VANCOPEAK, VANCORANDOM, GENTTROUGH, GENTPEAK, GENTRANDOM, TOBRATROUGH, TOBRAPEAK, TOBRARND, AMIKACINPEAK, AMIKACINTROU, AMIKACIN in the last 72 hours.   Microbiology: Recent Results (from the past 720 hour(s))  MRSA PCR Screening     Status: None   Collection Time: 04/08/18  6:38 AM  Result Value Ref Range Status   MRSA by PCR NEGATIVE NEGATIVE Final    Comment:        The GeneXpert MRSA Assay (FDA approved for NASAL specimens only), is one component of a comprehensive MRSA colonization surveillance program. It is not intended to diagnose MRSA infection nor to guide or monitor treatment for MRSA infections. Performed at [facility], [address], [place], [place]     Medical History: Past Medical History:  Diagnosis Date  . Anxiety   . Arthritis    rheumatoid arthritis- mild, no rheumatology care   . Asthma    seasonal allergies   . Asymptomatic LV dysfunction    a. Echo in Dec 2011 with EF 35 to 40%. Felt to be due  to paced rhythm. b. EF 25-30% in 07/2014.  . Bipolar affective disorder (HCC)   . Cardiac pacemaker    a. Since age 20 in 36. b. Upgrade to BiV in 2013.  2014 Carpal tunnel syndrome of right wrist   . CHF (congestive heart failure) (HCC)   . Congenital complete AV block   . Cor pulmonale, chronic (HCC) 04/08/2018  . Depression    bipolar  . Diabetes mellitus without complication (HCC)   . GERD (gastroesophageal reflux disease)   . Hypertension   . Interstitial lung disease (HCC)   . Lupus (HCC)   . Lupus (systemic lupus erythematosus) (HCC)   . Obesity   . Pneumonitis    a. a/w hypoxia - inflammatory - large workup 07/2014.  . Presence of permanent cardiac pacemaker   . Seizures (HCC)    as a child- from high fever  . Sinus tachycardia      Assessment: 37 yo female to be continued on zosyn for HCAP. Pharmacy asked to dose. CrCl >60 ml/min   Plan:  Zosyn 3.375gm IV q8h Monitor for clinical course and de-escalation to PO  Daana Petrasek A. [address], PharmD, BCPS Clinical Pharmacist [place] Pager: [phone]   04/08/2018,10:56 AM

## 2018-04-08 NOTE — H&P (Addendum)
Family Medicine Teaching [facility] Admission History and Physical Service Pager: [phone]  Patient name: Makayla Vasquez Medical record number: [id number] Date of birth: 10-23-1980 Age: 37 y.o. Gender: female  Primary Care Provider: Marquette Saa, MD Consultants: None Code Status: Full  Chief Complaint: Cough and SOB  Assessment and Plan: Makayla Vasquez is a 37 y.o. female presenting with cough and SOB. PMH is significant for SLE, ILD, HFrEF s/p BiV PPM, T2DM, HLD, HTN, Bipolar Disorder, and GERD  Cough and SOB. Acute on Chronic. Multiple etiologies considered including CHF exacerbation, PNA, PE, and worsening ILD. CHF exacerbation appears less likely as patient is euvolemic, no crackles or rales on exam, stable on 4L of O2 and BNP of 15.7.  PE was considered but Wells' score of 1.5 making her low risk. ED did perform an chest CTA (due to tachycardia and dyspnea) that showed findings consistent with chronic CHF and no pulmonary embolus. Patient did present with increased oxygen requirement of 4L and had a desaturation into the 70s during transport to hospital. Patient has been afebrile but WBC is elevated at 13.8 and consistent tachycardia >110s. Patient presentation, history, and work up most consistent with PNA as CTA could not definitively rule it out due to chronic IDL. Unlikely patient is septic as her lactic acid is normal. She received Vanc and Zosyn in the ED after blood cultures were drawn. Patient states she has been taking her medications for her SLE and IDL as prescribed. Patient with CURB-65 criteria at 1; low risk. - Admit to Med-Surg, attending Dr. Gwendolyn Grant - continuous pulse ox - cont oxygen therapy - F/u BCx - Trend Lactic Acid - Trend WBC - CBC and BMP in am - Cont Zosyn, dosing per pharm - Daily weights, I/Os - duonebs prn q4  - incentive spiometry  HFrEF s/p BiV PPM.Chronic. Appears stable. Upon chart review it appears she has some baseline sinus  tachycardia. On Coreg and Lasix at home. EKG significant for  is unchanged from previous. Last clinic weight 145 lbs, noted to be 137 lbs in the ED also indicating no signs of volume overload.  - Cont Coreg 3.125 BID - Cont Lasix 20mg  daily  SLE/IDL. Chronic. Stable. Patient is on cellcept and prednisone at home. She states she has been compliant in taking these medications.Tacrolimus, Prednisone, Mycophenolate. Patient has long history of interstitial lung disease and stable chronic 7mm lung nodule seen on CTA. Last note in chart is from Rheumatology in 2016. - Cont Tacrolimus at home dose  - Cont Mycophenolate 1000mg  BID - Cont Prednisone 10mg  daily - Mycophenolate level pending - Tacrolimus level pending  T2DM. Chronic. Not well controlled. On home Lantus 27U and Novolog 8U TID with meals. Last HgbA1c was 08/24/2017 at 8.3%. CBG on admission was 219. - monitor CBGs - sSSI, will titrate up and add Lantus as needed.   HTN. Chronic. Stable. SBP range since admission 87-111 and DBP 67-84. - cont Coreg and Lasix at home doses - monitor vitals  GERD. Chronic. Stable. Takes Protonix at home. - Holding Protonix due to prolonged QTc  FEN/GI: Heart-Healthy Carb Mod Prophylaxis: Enoxaparin  Disposition: inpatient  History of Present Illness:  Makayla Vasquez is a 37 y.o. female presenting with cough for 2 weeks productive at times whitish clear phlegm. SOB began this evening on home O2 so she increased her oxygen to 3L which is above her normal resting requirement of 2L. She states she had a desat into 84s in EMS, but  she knew she wasn't getting good oxygen b/c her hands had been turning blue that day. She states her appetite has been depressed but she is still eating, just not eating or drinking much for past "week or so". Patient is unsure if she has had viral illness, her 31-month old nephew has been sick and she has been around him recently. No fever at home. Back pain. Patient also has some  znxiety. On 2-4L of oxygen at home. On 4L in the ED.  Review Of Systems: Per HPI with the following additions:   Review of Systems  Constitutional: Positive for malaise/fatigue. Negative for chills and fever.  HENT: Positive for sore throat. Negative for congestion.        Rhinorrhea  Respiratory: Positive for cough and shortness of breath. Negative for hemoptysis and wheezing.   Cardiovascular: Positive for palpitations. Negative for chest pain.  Gastrointestinal: Positive for abdominal pain, diarrhea and nausea. Negative for constipation and vomiting.  Genitourinary: Negative for dysuria, frequency and urgency.  Musculoskeletal: Positive for back pain.  Psychiatric/Behavioral: The patient is nervous/anxious.     Patient Active Problem List   Diagnosis Date Noted  . Abnormal uterine bleeding 02/14/2018  . Concern about breast cancer in female without diagnosis 08/25/2017  . Dyspareunia, female 05/14/2017  . 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 On Cellcept therapy   . On prednisone therapy   . Systemic lupus erythematosus (HCC)   . Long term (current) use of systemic steroids   . Chronic diastolic congestive heart failure (HCC)   . Cough   . Anemia of chronic disease   . Bipolar affective disorder in remission (HCC)   . Cardiac pacemaker in situ   . Adrenal insufficiency (HCC)   . Antisynthetase syndrome (HCC)   . Idiopathic hypotension 07/08/2015  . Hyperlipidemia associated with type 2 diabetes mellitus (HCC) 05/04/2015  . Screening for cervical cancer 01/19/2015  . Diabetes mellitus type 2 in obese (HCC)   . Chronic respiratory failure with hypoxia (HCC) 08/07/2014  . Insertion of implantable subdermal contraceptive 07/12/2014  . ILD (interstitial lung disease) (HCC) 06/19/2014  . Congestive dilated cardiomyopathy (HCC) 05/31/2014  . Chronic systolic heart failure (HCC) 02/26/2012  . PPM-Medtronic   . Congenital complete AV block   . GERD (gastroesophageal reflux  disease)   . THYROGLOSSAL DUCT CYST 07/18/2010  . POLYCYSTIC OVARY 12/17/2006    Past Medical History: Past Medical History:  Diagnosis Date  . Anxiety   . Arthritis    rheumatoid arthritis- mild, no rheumatology care   . Asthma    seasonal allergies   . Asymptomatic LV dysfunction    a. Echo in Dec 2011 with EF 35 to 40%. Felt to be due to paced rhythm. b. EF 25-30% in 07/2014.  . Bipolar affective disorder (HCC)   . Cardiac pacemaker    a. Since age 78 in 52. b. Upgrade to BiV in 2013.  [name] Carpal tunnel syndrome of right wrist   . CHF (congestive heart failure) (HCC)   . Congenital complete AV block   . Depression    bipolar  . Diabetes mellitus without complication (HCC)   . GERD (gastroesophageal reflux disease)   . Hypertension   . Interstitial lung disease (HCC)   . Lupus (HCC)   . Lupus (systemic lupus erythematosus) (HCC)   . Obesity   . Pneumonitis    a. a/w hypoxia - inflammatory - large workup 07/2014.  . Presence of permanent cardiac pacemaker   . Seizures (  HCC)    as a child- from high fever  . Sinus tachycardia     Past Surgical History: Past Surgical History:  Procedure Laterality Date  . ATRIAL TACH ABLATION N/A 08/14/2014   Procedure: ATRIAL TACH ABLATION;  Surgeon: Marinus Maw, MD;  Location[facility] CATH LAB;  Service: Cardiovascular;  Laterality: N/A;  . BI-VENTRICULAR PACEMAKER UPGRADE N/A 03/08/2012   Procedure: BI-VENTRICULAR PACEMAKER UPGRADE;  Surgeon: Marinus Maw, MD;  Location: [facility] CATH LAB;  Service: Cardiovascular;  Laterality: N/A;  . CARPAL TUNNEL WITH CUBITAL TUNNEL Right 07/26/2013   Procedure: RIGHT LIMITED OPEN CARPAL TUNNEL RELEASE ,  RIGHT CUBITAL TUNNEL RELEASE, INSITU VERSES ULNAR NERVE DECOMPRESSION AND ANTERIOR TRANSPOSITION;  Surgeon: Dominica Severin, MD;  Location: [facility] OR;  Service: Orthopedics;  Laterality: Right;  . CESAREAN SECTION    . CHOLECYSTECTOMY    . CYST EXCISION  12/10/2012   THYROID  . INSERT / REPLACE / REMOVE  PACEMAKER     2001  . IUD REMOVAL  11/03/2011   Procedure: INTRAUTERINE DEVICE (IUD) REMOVAL;  Surgeon: Myra C. Marice Potter, MD;  Location: [facility] ORS;  Service: Gynecology;  Laterality: N/A;  . NASAL FRACTURE SURGERY     /w plate   . RIGHT HEART CATHETERIZATION N/A 10/26/2014   Procedure: RIGHT HEART CATH;  Surgeon: Dolores Patty, MD;  Location: [facility] CATH LAB;  Service: Cardiovascular;  Laterality: N/A;  . THROAT SURGERY  1994   s/p laser treatment  . THYROGLOSSAL DUCT CYST N/A 12/10/2012   Procedure: REVISION OF THYROGLOSSAL DUCT CYST EXCISION;  Surgeon: Serena Colonel, MD;  Location: [facility] OR;  Service: ENT;  Laterality: N/A;  Revision of Thyroglossal Duct Cyst Excision  . VIDEO BRONCHOSCOPY Bilateral 06/19/2014   Procedure: VIDEO BRONCHOSCOPY WITHOUT FLUORO;  Surgeon: Kalman Shan, MD;  Location: [facility] ENDOSCOPY;  Service: Cardiopulmonary;  Laterality: Bilateral;    Social History: Social History   Tobacco Use  . Smoking status: Former Smoker    Packs/day: 0.25    Years: 0.50    Pack years: 0.12    Types: Cigarettes    Last attempt to quit: 07/25/1996    Years since quitting: 21.7  . Smokeless tobacco: Never Used  Substance Use Topics  . Alcohol use: No  . Drug use: No   Additional social history: Please also refer to relevant sections of EMR.  Family History: Family History  Problem Relation Age of Onset  . Heart disease Mother        CHF (no details)  . Hypertension Mother   . Lupus Mother   . Heart disease Father        Murmur  . Heart disease Sister 50        No details.  History of a pacemaker    Allergies and Medications: Allergies  Allergen Reactions  . 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 Sertraline Hcl Hives  . Tape Other (See Comments)    Burns skin   No current facility-administered medications on file prior to encounter.    Current Outpatient Medications on File Prior to Encounter   Medication Sig Dispense Refill  . calcium-vitamin D (OSCAL WITH D) 500-200 MG-UNIT TABS tablet Take 1 tablet by mouth 2 (two) times daily with a meal.  11  . carvedilol (COREG) 3.125 MG tablet Take 1 tablet (3.125 mg total) by mouth 2 (two) times daily. 60 tablet 3  . dicyclomine (BENTYL) 20 MG tablet Take  20 mg by mouth every 6 (six) hours.    [name] EASY TOUCH PEN NEEDLES 31G X 8 MM MISC USE AS DIRECTED 100 each 11  . Etonogestrel (NEXPLANON [place]) Inject into the skin once.     . furosemide (LASIX) 40 MG tablet Take 1 tablet (40 mg total) by mouth daily. (Patient taking differently: Take 20 mg by mouth daily. ) 30 tablet 3  . LANTUS SOLOSTAR 100 UNIT/ML Solostar Pen INJECT 15 UNITS SUBCUTANEOUSLY ONCE DAILY (Patient taking differently: INJECT 27 UNITS SUBCUTANEOUSLY ONCE DAILY) 15 mL 3  . loratadine (CLARITIN) 10 MG tablet Take 1 tablet (10 mg total) by mouth daily. 30 tablet 11  . metFORMIN (GLUCOPHAGE) 1000 MG tablet Take 1 tablet (1,000 mg total) by mouth 2 (two) times daily with a meal. 90 tablet 3  . mycophenolate (CELLCEPT) 500 MG tablet Take 1,000 mg by mouth 2 (two) times daily.     [name] NOVOLOG FLEXPEN 100 UNIT/ML FlexPen INJECT 8 UNITS INTO THE SKIN 3 (THREE) TIMES DAILY WITH MEALS. 15 pen 5  . OXYGEN Inhale 4 L into the lungs.    . pantoprazole (PROTONIX) 40 MG tablet TAKE 1 TABLET (40MG  TOTAL) BY MOUTH DAILY 90 tablet 3  . potassium chloride SA (KLOR-CON M20) 20 MEQ tablet Take 2 tablets (40 mEq total) by mouth daily. 60 tablet 3  . predniSONE (DELTASONE) 50 MG tablet Take 25 mg by mouth daily with breakfast.    . ranitidine (ZANTAC) 150 MG tablet Take 150 mg by mouth 2 (two) times daily.    sulfamethoxazole-trimethoprim (BACTRIM,SEPTRA) 400-80 MG tablet Take 1 tablet by mouth every other day.    . tacrolimus (PROGRAF) 0.5 MG capsule Take 1.5 mg by mouth 2 (two) times daily.      Objective: BP 99/67   Pulse (!) 110   Temp 99.5 F (37.5 C)   Resp (!) 26   Ht 5\' 4"  (1.626 m)   Wt  137 lb (62.1 kg)   SpO2 99%   BMI 23.52 kg/m  Exam: Gen: Alert and Oriented x 3, NAD HEENT: Normocephalic, atraumatic, PERRLA, EOM, non-erythematous pharyngeal mucosa, no exudates Neck: trachea midline, no thyroidmegaly, no LAD CV: RRR, no murmurs, normal S1, S2 split, +2 pulses dorsalis pedis bilaterally Resp: Diminished breath sounds diffusely bilat, no wheezing, rales, or rhonchi, comfortable work of breathing on 4L Abd: non-distended, mild tender to palpation in LUQ, soft, +bs in all four quadrants, no hepatosplenomegaly MSK: Moves all four extremities Ext: no clubbing, cyanosis, or edema Neuro: CN II-XII intact, no focal or gross deficits Skin: warm, dry, intact, no rashes Psych: appropriate behavior, mood  Labs and Imaging: CBC BMET  Recent Labs  Lab 04/07/18 2304  WBC 13.8*  HGB 11.8*  HCT 40.4  PLT 336   Recent Labs  Lab 04/07/18 2304  NA 138  K 3.8  CL 102  CO2 22  BUN 12  CREATININE 0.79  GLUCOSE 219*  CALCIUM 8.8*     Tropoin I-stat: 0.01 Beta hCG: <5.0 BNP Lactic Acid: 1.30 BCx: pending  Chest CTA IMPRESSION: 1. Redemonstration of cardiomegaly with diffuse pulmonary vascular congestion, interstitial edema and superimposed ground-glass opacities. Findings likely reflect component of chronic CHF. Superimposed pneumonia would be difficult to entirely exclude. 2. Stable 6 mm left lower lobe pulmonary nodule dating back to 2017 favoring a benign etiology. 3. Dilated main pulmonary artery consistent with chronic pulmonary hypertension. No acute pulmonary embolus.  CXR -1 view IMPRESSION: Improving interstitial infiltration or edema in the  lung bases since previous study. Mild cardiac enlargement without vascular congestion.  Arlyce Harman, DO 04/08/2018, 2:30 AM PGY-1, [facility] Family Medicine FPTS Intern pager: [phone], text pages welcome  UPPER LEVEL ADDENDUM  I have read the above note and made revisions highlighted in  blue.  Noralee Chars, MD, PGY-3 [facility]

## 2018-04-08 NOTE — ED Notes (Signed)
Patient transported to CT 

## 2018-04-08 NOTE — Plan of Care (Signed)
  Problem: Education: Goal: Knowledge of General Education information will improve Outcome: Progressing   Problem: Clinical Measurements: Goal: Ability to maintain clinical measurements within normal limits will improve Outcome: Progressing   Problem: Clinical Measurements: Goal: Respiratory complications will improve Outcome: Progressing   

## 2018-04-09 ENCOUNTER — Other Ambulatory Visit: Payer: Self-pay | Admitting: Family Medicine

## 2018-04-09 DIAGNOSIS — J849 Interstitial pulmonary disease, unspecified: Secondary | ICD-10-CM

## 2018-04-09 DIAGNOSIS — Z79899 Other long term (current) drug therapy: Secondary | ICD-10-CM

## 2018-04-09 DIAGNOSIS — I2781 Cor pulmonale (chronic): Secondary | ICD-10-CM

## 2018-04-09 DIAGNOSIS — M3213 Lung involvement in systemic lupus erythematosus: Secondary | ICD-10-CM

## 2018-04-09 DIAGNOSIS — E2740 Unspecified adrenocortical insufficiency: Secondary | ICD-10-CM

## 2018-04-09 LAB — MYCOPHENOLIC ACID (CELLCEPT)
MPA GLUCURONIDE: 69 ug/mL (ref 15–125)
MPA: 1.4 ug/mL (ref 1.0–3.5)

## 2018-04-09 LAB — BASIC METABOLIC PANEL
ANION GAP: 9 (ref 5–15)
BUN: 17 mg/dL (ref 6–20)
CHLORIDE: 105 mmol/L (ref 101–111)
CO2: 25 mmol/L (ref 22–32)
Calcium: 9.3 mg/dL (ref 8.9–10.3)
Creatinine, Ser: 0.77 mg/dL (ref 0.44–1.00)
GFR calc non Af Amer: >60 mL/min (ref >60)
Glucose, Bld: 143 mg/dL — ABNORMAL HIGH (ref 65–99)
Potassium: 3.8 mmol/L (ref 3.5–5.1)
Sodium: 139 mmol/L (ref 135–145)

## 2018-04-09 LAB — CBC
HCT: 33.3 % — ABNORMAL LOW (ref 36.0–46.0)
HEMOGLOBIN: 9.9 g/dL — AB (ref 12.0–15.0)
MCH: 23.6 pg — ABNORMAL LOW (ref 26.0–34.0)
MCHC: 29.7 g/dL — ABNORMAL LOW (ref 30.0–36.0)
MCV: 79.5 fL (ref 78.0–100.0)
Platelets: 277 10*3/uL (ref 150–400)
RBC: 4.19 MIL/uL (ref 3.87–5.11)
RDW: 16.7 % — ABNORMAL HIGH (ref 11.5–15.5)
WBC: 7.8 10*3/uL (ref 4.0–10.5)

## 2018-04-09 LAB — C-REACTIVE PROTEIN: CRP: 4.3 mg/dL — ABNORMAL HIGH (ref <1.0)

## 2018-04-09 LAB — GLUCOSE, CAPILLARY
Glucose-Capillary: 162 mg/dL — ABNORMAL HIGH (ref 65–99)
Glucose-Capillary: 265 mg/dL — ABNORMAL HIGH (ref 65–99)

## 2018-04-09 LAB — SEDIMENTATION RATE: SED RATE: 59 mm/h — AB (ref 0–22)

## 2018-04-09 MED ORDER — CEFDINIR 300 MG PO CAPS
300.0000 mg | ORAL_CAPSULE | Freq: Two times a day (BID) | ORAL | Status: DC
  Administered 2018-04-09: 300 mg via ORAL
  Filled 2018-04-09: qty 1

## 2018-04-09 MED ORDER — CARVEDILOL 3.125 MG PO TABS: 3.1250 mg | ORAL_TABLET | Freq: Two times a day (BID) | ORAL | 1 refills | Status: DC

## 2018-04-09 MED ORDER — CEFDINIR 300 MG PO CAPS: 300.0000 mg | ORAL_CAPSULE | Freq: Two times a day (BID) | ORAL | 0 refills | Status: AC

## 2018-04-09 MED ORDER — FUROSEMIDE 20 MG PO TABS: 20.0000 mg | ORAL_TABLET | Freq: Every day | ORAL | 0 refills | Status: DC

## 2018-04-09 MED ORDER — PREDNISONE 20 MG PO TABS: 20.0000 mg | ORAL_TABLET | Freq: Two times a day (BID) | ORAL | 0 refills | Status: AC

## 2018-04-09 MED ORDER — RAMELTEON 8 MG PO TABS: 8.0000 mg | ORAL_TABLET | Freq: Every day | ORAL | 0 refills | Status: DC

## 2018-04-09 MED ORDER — PREDNISONE 10 MG PO TABS: 10.0000 mg | ORAL_TABLET | Freq: Three times a day (TID) | ORAL | 1 refills | Status: DC

## 2018-04-09 MED ORDER — PREDNISONE 50 MG PO TABS: ORAL_TABLET | ORAL | 0 refills | Status: DC

## 2018-04-09 NOTE — Progress Notes (Signed)
Family Medicine Teaching Service Daily Progress Note Intern Pager: [phone]  Patient name: Makayla Vasquez Medical record number: [id number] Date of birth: [birth year] Age: 37 y.o. Gender: female  Primary Care Provider: Verner Mould, MD Consultants: None Code Status: Full  Pt Overview and Major Events to Date:  Makayla Vasquez is a 37 y.o. female presenting with cough and SOB. PMH is significant for SLE, ILD, HFrEF s/p BiV PPM, T2DM, HLD, HTN, Bipolar Disorder, and GERD  Assessment and Plan:  Cough and SOB. Acute on Chronic. Patient did present with increased oxygen requirement of 4L but since admission has had O2 sats >92%. Patient has been afebrile and WBC is at  7.8. She received Vanc and Zosyn in the ED after blood cultures were drawn. Patient states she has been taking her medications for her SLE and IDL as prescribed. Patient with CURB-65 criteria at 1; low risk. MRSA PCR negative. BCx still pending. - continuous pulse ox - cont home oxygen 2L - Trend WBC - CBC and BMP in am - Discont Zosyn (6/20-21), start Cefdinir (6/21-24) for a total of 5 days of antibiotics - Daily weights, I/Os - duonebs prn q4  - incentive spiometry  HFrEF s/p BiV PPM.Chronic. Appears stable. Upon chart review it appears she has some baseline sinus tachycardia. On Coreg and Lasix at home. EKG significant for  is unchanged from previous. Last clinic weight 145 lbs, noted to be 137 lbs in the ED also indicating no signs of volume overload.  - Cont Coreg 3.125 BID - Cont Lasix 48m daily  SLE/IDL. Chronic. Stable. Patient is on cellcept and prednisone at home. She states she has been compliant in taking these medications.Tacrolimus, Prednisone, Mycophenolate. Patient has long history of interstitial lung disease and stable chronic 660mlung nodule seen on CTA. Last note in chart is from Rheumatology in 2016. - Cont Tacrolimus at home dose  - Cont Mycophenolate 100072mID - Cont Prednisone 4m18maily - Mycophenolate level pending - Tacrolimus level pending  T2DM. Chronic. Not well controlled. On home Lantus 27U and Novolog 8U TID with meals. Last HgbA1c was 08/24/2017 at 8.3%. CBG on admission was 219. - monitor CBGs - sSSI, will titrate up and add Lantus as needed.   HTN. Chronic. Stable. SBP range 100-111 and DBP 61-73 past 24 hours. - cont Coreg and Lasix at home doses - monitor vitals  GERD. Chronic. Stable. Takes Protonix at home. - Holding Protonix due to prolonged QTc  FEN/GI: Heart-Healthy Carb Modified PPx: Enoxaparin  Disposition: home  Subjective:  Patient states she feels well. Her cough has improved and she denies any fever, chills, SOB, or chest pain this am. She is agreeable to discharge home today.  Objective: Temp:  [97.7 F (36.5 C)-98 F (36.7 C)] 98 F (36.7 C) (06/21 0500) Pulse Rate:  [69-88] 85 (06/21 0500) Resp:  [16] 16 (06/21 0500) BP: (100-111)/(61-73) 106/70 (06/21 0500) SpO2:  [98 %-100 %] 98 % (06/21 0500) Weight:  [138 lb 7.2 oz (62.8 kg)] 138 lb 7.2 oz (62.8 kg) (06/21 0500) Physical Exam: Gen: Alert and Oriented x 3, NAD HEENT: Normocephalic, atraumatic, PERRLA, EOMI CV: RRR, no murmurs, normal S1, S2 split, +2 pulses dorsalis pedis bilaterally Resp: CTAB, no wheezing, rales, or rhonchi, comfortable work of breathing Abd: non-distended, non-tender, soft, +bs in all four quadrants MSK: Moves all four extremities Ext: no clubbing, cyanosis, or edema Skin: warm, dry, intact, no rashes  Laboratory: Recent Labs  Lab 04/07/18 2304 04/08/18 0745  04/09/18 0432  WBC 13.8* 7.7 7.8  HGB 11.8* 10.6* 9.9*  HCT 40.4 35.5* 33.3*  PLT 336 273 277   Recent Labs  Lab 04/07/18 2304 04/08/18 0745 04/09/18 0432  NA 138 133* 139  K 3.8 4.4 3.8  CL 102 101 105  CO2 22 21* 25  BUN _0 CREATININE 0.79 0.90 0.77  CALCIUM 8.8* 8.4* 9.3  GLUCOSE 219* 393* 143*   Tropoin I-stat: 0.01 Beta hCG: <5.0 BNP: 15.7 Lactic Acid:  1.30 BCx: pending Tacrolimus level: pending Mycophenolic Acid: pending A4/G4: pending ESR: 59 CRP: 4.3  Imaging/Diagnostic Tests:  Chest CTA IMPRESSION: 1. Redemonstration of cardiomegaly with diffuse pulmonary vascular congestion, interstitial edema and superimposed ground-glass opacities. Findings likely reflect component of chronic CHF. Superimposed pneumonia would be difficult to entirely exclude. 2. Stable 6 mm left lower lobe pulmonary nodule dating back to 2017 favoring a benign etiology. 3. Dilated main pulmonary artery consistent with chronic pulmonary hypertension. No acute pulmonary embolus.  CXR -1 view IMPRESSION: Improving interstitial infiltration or edema in the lung bases since previous study. Mild cardiac enlargement without vascular congestion.  Nuala Alpha, DO 04/09/2018, 7:27 AM PGY-1, [place] Intern pager: [phone], text pages welcome

## 2018-04-09 NOTE — Progress Notes (Signed)
Inpatient Diabetes Program Recommendations  AACE/ADA: New Consensus Statement on Inpatient Glycemic Control (2019)  Target Ranges:  Prepandial:   less than 140 mg/dL      Peak postprandial:   less than 180 mg/dL (1-2 hours)      Critically ill patients:  140 - 180 mg/dL   Results for Makayla Vasquez, Makayla Vasquez (MRN [id number]) as of 04/09/2018 11:21  Ref. Range 04/08/2018 07:37 04/08/2018 12:21 04/08/2018 17:50 04/08/2018 21:17 04/09/2018 07:44  Glucose-Capillary Latest Ref Range: 65 - 99 mg/dL 115 (H) 726 (H) 203 (H) 176 (H) 162 (H)    Review of Glycemic Control  Outpatient Diabetes medications: Lantus 25 units daily, Novolog 8 units TID with meals, Metformin 1000 mg BID Current orders for Inpatient glycemic control: Lantus 14 units daily, Novolog 0-9 units TID with meals; Prednisone 30 mg QAM  Inpatient Diabetes Program Recommendations: Correction (SSI): Please consider ordering Novolog 0-5 units QHS for bedtime correction. Insulin - Meal Coverage: Please consider odering Novolog 2 units TID with meals for meal coverage if patient is eating at least 50% of meals.  Thanks, [name], RN, MSN, CDE Diabetes Coordinator Inpatient Diabetes Program [phone] (Team Pager from 8am to 5pm)

## 2018-04-09 NOTE — Discharge Instructions (Signed)
You were hospitalized with concern for Pneumonia. You were started on IV antibiotics and you improved greatly. Please continue taking the antibiotic for the full course and ensure you are seen in the clinic for your follow up appointment.   Community-Acquired Pneumonia, Adult Pneumonia is an infection of the lungs. There are different types of pneumonia. One type can develop while a person is in a hospital. A different type, called community-acquired pneumonia, develops in people who are not, or have not recently been, in the hospital or other health care facility. What are the causes? Pneumonia may be caused by bacteria, viruses, or funguses. Community-acquired pneumonia is often caused by Streptococcus pneumonia bacteria. These bacteria are often passed from one person to another by breathing in droplets from the cough or sneeze of an infected person. What increases the risk? The condition is more likely to develop in:  People who havechronic diseases, such as chronic obstructive pulmonary disease (COPD), asthma, congestive heart failure, cystic fibrosis, diabetes, or kidney disease.  People who haveearly-stage or late-stage HIV.  People who havesickle cell disease.  People who havehad their spleen removed (splenectomy).  People who havepoor Administrator.  People who havemedical conditions that increase the risk of breathing in (aspirating) secretions their own mouth and nose.  People who havea weakened immune system (immunocompromised).  People who smoke.  People whotravel to areas where pneumonia-causing germs commonly exist.  People whoare around animal habitats or animals that have pneumonia-causing germs, including birds, bats, rabbits, cats, and farm animals.  What are the signs or symptoms? Symptoms of this condition include:  Adry cough.  A wet (productive) cough.  Fever.  Sweating.  Chest pain, especially when breathing deeply or coughing.  Rapid  breathing or difficulty breathing.  Shortness of breath.  Shaking chills.  Fatigue.  Muscle aches.  How is this diagnosed? Your health care provider will take a medical history and perform a physical exam. You may also have other tests, including:  Imaging studies of your chest, including X-rays.  Tests to check your blood oxygen level and other blood gases.  Other tests on blood, mucus (sputum), fluid around your lungs (pleural fluid), and urine.  If your pneumonia is severe, other tests may be done to identify the specific cause of your illness. How is this treated? The type of treatment that you receive depends on many factors, such as the cause of your pneumonia, the medicines you take, and other medical conditions that you have. For most adults, treatment and recovery from pneumonia may occur at home. In some cases, treatment must happen in a hospital. Treatment may include:  Antibiotic medicines, if the pneumonia was caused by bacteria.  Antiviral medicines, if the pneumonia was caused by a virus.  Medicines that are given by mouth or through an IV tube.  Oxygen.  Respiratory therapy.  Although rare, treating severe pneumonia may include:  Mechanical ventilation. This is done if you are not breathing well on your own and you cannot maintain a safe blood oxygen level.  Thoracentesis. This procedureremoves fluid around one lung or both lungs to help you breathe better.  Follow these instructions at home:  Take over-the-counter and prescription medicines only as told by your health care provider. ? Only takecough medicine if you are losing sleep. Understand that cough medicine can prevent your bodys natural ability to remove mucus from your lungs. ? If you were prescribed an antibiotic medicine, take it as told by your health care provider. Do not stop  taking the antibiotic even if you start to feel better.  Sleep in a semi-upright position at night. Try sleeping in  a reclining chair, or place a few pillows under your head.  Do not use tobacco products, including cigarettes, chewing tobacco, and e-cigarettes. If you need help quitting, ask your health care provider.  Drink enough water to keep your urine clear or pale yellow. This will help to thin out mucus secretions in your lungs. How is this prevented? There are ways that you can decrease your risk of developing community-acquired pneumonia. Consider getting a pneumococcal vaccine if:  You are older than 38 years of age.  You are older than 38 years of age and are undergoing cancer treatment, have chronic lung disease, or have other medical conditions that affect your immune system. Ask your health care provider if this applies to you.  There are different types and schedules of pneumococcal vaccines. Ask your health care provider which vaccination option is best for you. You may also prevent community-acquired pneumonia if you take these actions:  Get an influenza vaccine every year. Ask your health care provider which type of influenza vaccine is best for you.  Go to the dentist on a regular basis.  Wash your hands often. Use hand sanitizer if soap and water are not available.  Contact a health care provider if:  You have a fever.  You are losing sleep because you cannot control your cough with cough medicine. Get help right away if:  You have worsening shortness of breath.  You have increased chest pain.  Your sickness becomes worse, especially if you are an older adult or have a weakened immune system.  You cough up blood. This information is not intended to replace advice given to you by your health care provider. Make sure you discuss any questions you have with your health care provider. Document Released: 10/06/2005 Document Revised: 02/14/2016 Document Reviewed: 01/31/2015 Elsevier Interactive Patient Education  [employer].

## 2018-04-09 NOTE — Progress Notes (Signed)
[name] to be D/C'd Home per MD order.  Discussed with the patient and all questions fully answered.  VSS, Skin clean, dry and intact without evidence of skin break down, no evidence of skin tears noted. IV catheter discontinued intact. Site without signs and symptoms of complications. Dressing and pressure applied.  An After Visit Summary was printed and given to the patient. Patient received prescription.  D/c education completed with patient/family including follow up instructions, medication list, d/c activities limitations if indicated, with other d/c instructions as indicated by MD - patient able to verbalize understanding, all questions fully answered.   Patient instructed to return to ED, call 911, or call MD for any changes in condition.   Patient escorted via WC, and D/C home via private auto.  Caren Hazy 04/09/2018 1:33 PM

## 2018-04-09 NOTE — Progress Notes (Signed)
Chaplain Note:  Request was for Advanced Directive.  Patient wanted to understand more about AD and I spent time going over the documents and purpose. She said she will think about it and if she wants to complete one she will ask the nurse to contact our office.   Makayla Vasquez  Director Spiritual Care

## 2018-04-09 NOTE — Discharge Summary (Signed)
[facility] Discharge Summary  Patient name: Makayla Vasquez Medical record number: [id number] Date of birth: 1980/11/01 Age: 37 y.o. Gender: female Date of Admission: 04/07/2018  Date of Discharge: 04/09/2018 Admitting Physician: Alveda Reasons, MD  Primary Care Provider: Verner Mould, MD Consultants: None  Indication for Hospitalization:   PNA  Discharge Diagnoses/Problem List:   PNA HFrEF s/p BiVentricular Pacemaker SLE Interstitial Lung Disease T2DM HTN HLD GERD  Disposition: home  Discharge Condition: medically stable  Discharge Exam:   Gen: Alert and Oriented x 3, NAD HEENT: Normocephalic, atraumatic, PERRLA, EOMI CV: RRR, no murmurs, normal S1, S2 split, +2 pulses dorsalis pedis bilaterally Resp: CTAB, no wheezing, rales, or rhonchi, comfortable work of breathing Abd: non-distended, non-tender, soft, +bs in all four quadrants MSK: Moves all four extremities Ext: no clubbing, cyanosis, or edema Skin: warm, dry, intact, no rashes  Brief Hospital Course:  Ms Ludene Stokke is a 37y/o female with PMH of SLE, ILD, HFrEF s/p BiV PPM, T2DM, HLD, HTN, Bipolar Disorder, and GERD who presented to the ED with tachycardia, tachypnea, and a new oxygen requirement. She has been dealing with a cough and congestion for about 2 weeks but on the morning of 6/29 she became increasingly short of breath. She is on home oxygen (2L at baseline) and she increased it to 4L with no help. Per EMS she had a desat into the 70s on 4L. Upon arrival to the ED she was started on Vanc and Zosyn and she was worked up for a pulmonary emboli vs PNA. She had a CTA of the chest that ruled out a PE but could not rule out a PNA. Given her elevated WBC, clinical presentation, and history she was admitted and watched overnight. She clinically improved on Zosyn and her WBC normalized, she was able to breath comfortably on 2L and her tachycardia resolved. Her blood cultures  were negative at 24 hours and she was converted to oral antibiotics and watched for 24 hours on oral Cefdinir. She did well on Cefdinir and was discharged home with close follow up.  Her episode is likely multifactorial as she likely had either a viral or bacterial infection that caused an acute decompensation in her already poor lungs. It is also possible her episode could have been caused by a rapid steroid taper.  Issues for Follow Up:  1. While in the hospital we increased her Prednisone to 10m daily. On discharge I started her on a taper, please ensure she is following the taper appropriately. 2. She was started on Cefdinir and will complete a total of 5 days of antibiotics. Please ensure she finishes taking Cefdinir. 3. Question if anxiety played a role in her condition. I suggest a GAD-7 to see if her anxiety is well controlled.  Significant Procedures:   None  Significant Labs and Imaging:  Recent Labs  Lab 04/07/18 2304 04/08/18 0745 04/09/18 0432  WBC 13.8* 7.7 7.8  HGB 11.8* 10.6* 9.9*  HCT 40.4 35.5* 33.3*  PLT 336 273 277   Recent Labs  Lab 04/07/18 2304 04/08/18 0745 04/09/18 0432  NA 138 133* 139  K 3.8 4.4 3.8  CL 102 101 105  CO2 22 21* 25  GLUCOSE 219* 393* 143*  BUN 12 12 17   CREATININE 0.79 0.90 0.77  CALCIUM 8.8* 8.4* 9.3    Tropoin I-stat: 0.01 Beta hCG: <5.0 BNP: 15.7 Lactic Acid: 1.30 BCx: pending Tacrolimus level: 3.7 Mycophenolic Acid: 1.4 CV5/I4 132/27 ESR: 59 CRP:  4.3  Chest CTA IMPRESSION: 1. Redemonstration of cardiomegaly with diffuse pulmonary vascular congestion, interstitial edema and superimposed ground-glass opacities. Findings likely reflect component of chronic CHF. Superimposed pneumonia would be difficult to entirely exclude. 2. Stable 6 mm left lower lobe pulmonary nodule dating back to 2017 favoring a benign etiology. 3. Dilated main pulmonary artery consistent with chronic pulmonary hypertension. No acute pulmonary  embolus.  CXR -1 view IMPRESSION: Improving interstitial infiltration or edema in the lung bases since previous study. Mild cardiac enlargement without vascular congestion.   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   carvedilol 3.125 MG tablet Commonly known as:  COREG Take 1 tablet (3.125 mg total) by mouth 2 (two) times daily with a meal. What changed:  when to take this   cefdinir 300 MG capsule Commonly known as:  OMNICEF Take 1 capsule (300 mg total) by mouth every 12 (twelve) hours for 4 days.   dicyclomine 20 MG tablet Commonly known as:  BENTYL Take 20 mg by mouth every 6 (six) hours.   EASY TOUCH PEN NEEDLES 31G X 8 MM Misc Generic drug:  Insulin Pen Needle USE AS DIRECTED   furosemide 20 MG tablet Commonly known as:  LASIX Take 1 tablet (20 mg total) by mouth daily. What changed:    medication strength  how much to take   LANTUS SOLOSTAR 100 UNIT/ML Solostar Pen Generic drug:  Insulin Glargine INJECT 15 UNITS SUBCUTANEOUSLY ONCE DAILY What changed:  See the new instructions.   metFORMIN 1000 MG tablet Commonly known as:  GLUCOPHAGE Take 1 tablet (1,000 mg total) by mouth 2 (two) times daily with a meal.   mycophenolate 500 MG tablet Commonly known as:  CELLCEPT Take 1,000 mg by mouth 2 (two) times daily.   NEXPLANON [place] Inject 1  application into the skin once.   NOVOLOG FLEXPEN 100 UNIT/ML FlexPen Generic drug:  insulin aspart INJECT 8 UNITS INTO THE SKIN 3 (THREE) TIMES DAILY WITH MEALS.   OXYGEN Inhale 4 L into the lungs.   potassium chloride SA 20 MEQ tablet Commonly known as:  KLOR-CON M20 Take 2 tablets (40 mEq total) by mouth daily.   predniSONE 10 MG tablet Commonly known as:  DELTASONE Take 1 tablet (10 mg total) by mouth 3 (three) times daily for 36 doses. After 7/12 take 2 tab/day for 7 days, then 1 tab/day. What changed:    medication strength  when to take this  additional instructions   predniSONE 50 MG tablet Commonly known as:  DELTASONE Please take 1 tab per day for 7 days, ending on 6/28 What changed:  You were already taking a medication with the same name, and this prescription was added. Make sure you understand how and when to take each.   predniSONE 20 MG tablet Commonly known as:  DELTASONE Take 1 tablet (20 mg total) by mouth 2 (two) times daily with a meal for 7 days. What changed:  You were already taking a medication with the same name, and this prescription was added. Make sure you understand how and when to take each.   ramelteon 8 MG tablet Commonly known as:  ROZEREM Take 1 tablet (8 mg total) by mouth at bedtime.   tacrolimus 0.5 MG capsule Commonly known as:  PROGRAF Take 1.5 mg by mouth 2 (two) times daily.       Discharge Instructions: Please refer to Patient Instructions section of EMR for full details.  Patient was counseled important signs and symptoms that should prompt return to medical care, changes in medications, dietary instructions, activity restrictions, and follow up appointments.   Follow-Up Appointments: Follow-up Information    Verner Mould, MD. Go on 04/12/2018.   Specialty:  Family Medicine Why:  Please ensure you arrive 15 minutes early for your 3:10pm appointment with Dr. Avon Gully. Contact information: [place] [address] [phone]           Nuala Alpha, DO 04/11/2018, 11:23 PM PGY-1, [place]

## 2018-04-10 LAB — TACROLIMUS LEVEL: Tacrolimus (FK506) - LabCorp: 3.7 ng/mL (ref 2.0–20.0)

## 2018-04-10 LAB — C4 COMPLEMENT: Complement C4, Body Fluid: 27 mg/dL (ref 14–44)

## 2018-04-10 LAB — C3 COMPLEMENT: C3 Complement: 132 mg/dL (ref 82–167)

## 2018-04-12 ENCOUNTER — Encounter: Payer: Self-pay | Admitting: Internal Medicine

## 2018-04-12 ENCOUNTER — Other Ambulatory Visit: Payer: Self-pay

## 2018-04-12 ENCOUNTER — Ambulatory Visit (INDEPENDENT_AMBULATORY_CARE_PROVIDER_SITE_OTHER): Payer: Medicare Other | Admitting: Internal Medicine

## 2018-04-12 VITALS — BP 106/66 | HR 108 | Temp 98.9°F | Ht 65.0 in | Wt 141.0 lb

## 2018-04-12 DIAGNOSIS — E118 Type 2 diabetes mellitus with unspecified complications: Secondary | ICD-10-CM | POA: Diagnosis present

## 2018-04-12 DIAGNOSIS — J189 Pneumonia, unspecified organism: Secondary | ICD-10-CM

## 2018-04-12 LAB — POCT GLYCOSYLATED HEMOGLOBIN (HGB A1C): Hemoglobin A1C: 7.6 % — AB (ref 4.0–5.6)

## 2018-04-12 MED ORDER — TRAZODONE HCL 50 MG PO TABS: 50.0000 mg | ORAL_TABLET | Freq: Every evening | ORAL | 3 refills | Status: DC | PRN

## 2018-04-12 NOTE — Assessment & Plan Note (Signed)
Continuing to improve since discharge. Still continuing abx course and prednisone taper. O2 sat 97% on baseline of 2L O2 by [place]. Lungs CTAB. Continue cefdinir to complete 5d course. Continue prednisone taper. Clarified taper schedule with patient. F?u in one month to ensure continued improvement.

## 2018-04-12 NOTE — Patient Instructions (Signed)
It was nice seeing you again today Makayla Vasquez!  Please continue taking cefdinir (antibiotic) until you have completed all the pills.   Follow the schedule below for your prednisone taper: - Keep taking the 50 mg prednisone tablets until you have taken them all, then... - Take two 20 mg tablets (40 mg total) for one week, then... - Take three 10 mg tablets (30 mg total) for one week, then... - Take two 10 mg tablets (20 mg total) for one week, then... - Take one 10 mg tablet (10 mg total) for one week  You can take trazodone as needed for sleep.   We will see you back in about a month to make sure you are continuing to improve.   If you have any questions or concerns, please feel free to call the clinic.   Be well,  Dr. Natale Milch

## 2018-04-12 NOTE — Progress Notes (Signed)
   Subjective:   Patient: Makayla Vasquez       Birthdate: January 17, 1981       MRN: [id number]      HPI  Makayla Vasquez is a 37 y.o. female presenting for hospital f/u.   Patient admitted from 06/19-06/21 for CAP. She presented with tachycardia, tachypnea, and new )2 requirement after cough x2w. CTA chest to rule out PE in ED revealed PNA. Patient was started on vanc/zosyn then transitioned to zosyn alone. When she was able to wean to her baseline O2 requirement of 2L, she was transitioned to cefdinir. As she continued to improve, she was discharged with cefdinir to complete 5d course, as well as prednisone taper.  Patient reports doing well since discharge. Says she feels as if her cough is improving. Has 2d of cefdinir left. Is continuing prednisone taper as instructed. Says that her breathing is improving. Denies fevers. Is eating and drinking normally.   Smoking status reviewed. Patient is former smoker.   Review of Systems See HPI.     Objective:  Physical Exam  Constitutional: She is oriented to person, place, and time. She appears well-developed and well-nourished. No distress.  HENT:  Head: Normocephalic and atraumatic.  Cardiovascular: Normal rate, regular rhythm and normal heart sounds.  No murmur heard. Pulmonary/Chest: Effort normal and breath sounds normal. No respiratory distress. She has no wheezes. She has no rales.  Wearing portable O2 by nasal cannula. Normal WOB on RA. Able to speak in full sentences.   Neurological: She is alert and oriented to person, place, and time.  Psychiatric: She has a normal mood and affect. Her behavior is normal.     Assessment & Plan:  PNA (pneumonia) Continuing to improve since discharge. Still continuing abx course and prednisone taper. O2 sat 97% on baseline of 2L O2 by [place]. Lungs CTAB. Continue cefdinir to complete 5d course. Continue prednisone taper. Clarified taper schedule with patient. F?u in one month to ensure continued improvement.      Tarri Abernethy, MD, MPH PGY-3 [facility] Pager [phone]

## 2018-04-13 ENCOUNTER — Telehealth: Payer: Self-pay | Admitting: *Deleted

## 2018-04-13 LAB — CULTURE, BLOOD (ROUTINE X 2)
Culture: NO GROWTH
Culture: NO GROWTH
Special Requests: ADEQUATE

## 2018-04-13 NOTE — Telephone Encounter (Signed)
Pharmacy needs clarification on prednisone 10mg .    The directions state Take 1 tablet (10 mg total) by mouth 3 (three) times daily for 36 doses. After 7/12 take 2 tab/day for 7 days, then 1 tab/day.  But she was only provided with 18 tablets which is not enough.    Will forward to provider. [place], 9/12, CMA

## 2018-04-14 MED ORDER — PREDNISONE 10 MG PO TABS: ORAL_TABLET | ORAL | 0 refills | Status: DC

## 2018-04-14 NOTE — Telephone Encounter (Signed)
Returned pharmacy call regarding clarification for prednisone prescription. Patient should have been prescribed 42 10mg  tabs to complete taper as prescribed. Left detailed message on pharmacy voicemail regarding this, but also sent in prescription electronically (patient was only prescribed 18 tabs).   , MD, MPH PGY-3 [facility] Pager [phone]

## 2018-04-20 ENCOUNTER — Other Ambulatory Visit: Payer: Self-pay | Admitting: Cardiology

## 2018-04-23 ENCOUNTER — Other Ambulatory Visit: Payer: Self-pay | Admitting: Internal Medicine

## 2018-04-23 DIAGNOSIS — E669 Obesity, unspecified: Principal | ICD-10-CM

## 2018-04-23 DIAGNOSIS — E1169 Type 2 diabetes mellitus with other specified complication: Secondary | ICD-10-CM

## 2018-05-01 ENCOUNTER — Other Ambulatory Visit: Payer: Self-pay | Admitting: Family Medicine

## 2018-05-06 ENCOUNTER — Ambulatory Visit: Payer: Medicare Other | Admitting: Internal Medicine

## 2018-05-06 DIAGNOSIS — M609 Myositis, unspecified: Secondary | ICD-10-CM | POA: Diagnosis not present

## 2018-05-06 DIAGNOSIS — R109 Unspecified abdominal pain: Secondary | ICD-10-CM | POA: Diagnosis not present

## 2018-05-06 DIAGNOSIS — J329 Chronic sinusitis, unspecified: Secondary | ICD-10-CM | POA: Diagnosis not present

## 2018-05-06 DIAGNOSIS — J849 Interstitial pulmonary disease, unspecified: Secondary | ICD-10-CM | POA: Diagnosis not present

## 2018-05-06 DIAGNOSIS — M25562 Pain in left knee: Secondary | ICD-10-CM | POA: Diagnosis not present

## 2018-05-06 DIAGNOSIS — J069 Acute upper respiratory infection, unspecified: Secondary | ICD-10-CM | POA: Diagnosis not present

## 2018-05-06 DIAGNOSIS — M6080 Other myositis, unspecified site: Secondary | ICD-10-CM | POA: Diagnosis not present

## 2018-05-06 DIAGNOSIS — R110 Nausea: Secondary | ICD-10-CM | POA: Diagnosis not present

## 2018-05-06 DIAGNOSIS — R768 Other specified abnormal immunological findings in serum: Secondary | ICD-10-CM | POA: Diagnosis not present

## 2018-05-06 DIAGNOSIS — I501 Left ventricular failure: Secondary | ICD-10-CM | POA: Diagnosis not present

## 2018-05-06 DIAGNOSIS — D8989 Other specified disorders involving the immune mechanism, not elsewhere classified: Secondary | ICD-10-CM | POA: Diagnosis not present

## 2018-05-06 DIAGNOSIS — G8929 Other chronic pain: Secondary | ICD-10-CM | POA: Diagnosis not present

## 2018-05-06 DIAGNOSIS — G629 Polyneuropathy, unspecified: Secondary | ICD-10-CM | POA: Diagnosis not present

## 2018-05-06 DIAGNOSIS — M25561 Pain in right knee: Secondary | ICD-10-CM | POA: Diagnosis not present

## 2018-05-06 DIAGNOSIS — Z79899 Other long term (current) drug therapy: Secondary | ICD-10-CM | POA: Diagnosis not present

## 2018-05-06 DIAGNOSIS — R000 Tachycardia, unspecified: Secondary | ICD-10-CM | POA: Diagnosis not present

## 2018-05-06 DIAGNOSIS — R197 Diarrhea, unspecified: Secondary | ICD-10-CM | POA: Diagnosis not present

## 2018-05-08 NOTE — Progress Notes (Signed)
   Subjective   Patient ID: JACKSON FETTERS    DOB: 18-Jan-1981, 37 y.o. female   MRN: [id number]  CC: "Follow-up for pneumonia"  HPI: DEVYNN HESSLER is a 37 y.o. female who presents to clinic today for the following:  Community acquired pneumonia: Patient seen on 04/12/2018 for follow-up after admission for CAP.  She completed the 5-day steroid burst and a course of Cefdinir.  Patient states she has been titrating her home oxygen between 2-4 L daily.  She continues to have a nonproductive cough at times, but otherwise denies fevers or chills, worsening shortness of breath from baseline, chest pain, wheeze.  She feels that she may be having some postnasal drip from her allergies may be contributing to her cough.  She is expected to see her pulmonologist Dr. Maple Hudson in 3 weeks.  Patient is currently on chronic steroids at a dose of 10 mg of prednisone daily and will decrease to 5 mg daily next week as recommended by her rheumatologist, Dr. Allena Napoleon.  ROS: see HPI for pertinent.  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  Surgical history pacemaker and ablation, cholecystectomy, C-section, nasal, thyroglossal ductal cyst.  Family history heart disease, lupus, HTN.  Smoking status reviewed. Medications reviewed.  Objective   BP 100/60   Pulse 97   Temp 98.2 F (36.8 C) (Oral)   Ht 5\' 5"  (1.651 m)   Wt 141 lb (64 kg)   SpO2 93%   BMI 23.46 kg/m  Vitals and nursing note reviewed.  General: well nourished, well developed, NAD with non-toxic appearance HEENT: normocephalic, atraumatic, moist mucous membranes Neck: supple, non-tender without lymphadenopathy Cardiovascular: regular rate and rhythm without murmurs, rubs, or gallops Lungs: clear to auscultation bilaterally with normal work of breathing on 4L with O2 concentrator Skin: warm, dry, no rashes  or lesions, cap refill < 2 seconds Extremities: warm and well perfused, normal tone, no edema  Assessment & Plan   Community acquired pneumonia Acute.  Resolved.  Patient tolerating 4L [place].   - Follow-up with pulmonology in 1 month  No orders of the defined types were placed in this encounter.  No orders of the defined types were placed in this encounter.   , DO [facility], PGY-3 05/10/2018, 3:34 PM

## 2018-05-09 ENCOUNTER — Other Ambulatory Visit: Payer: Self-pay | Admitting: Internal Medicine

## 2018-05-10 ENCOUNTER — Ambulatory Visit (INDEPENDENT_AMBULATORY_CARE_PROVIDER_SITE_OTHER): Payer: Medicare Other | Admitting: Family Medicine

## 2018-05-10 ENCOUNTER — Other Ambulatory Visit: Payer: Self-pay

## 2018-05-10 ENCOUNTER — Encounter: Payer: Self-pay | Admitting: Family Medicine

## 2018-05-10 VITALS — BP 100/60 | HR 97 | Temp 98.2°F | Ht 65.0 in | Wt 141.0 lb

## 2018-05-10 DIAGNOSIS — J189 Pneumonia, unspecified organism: Secondary | ICD-10-CM | POA: Diagnosis not present

## 2018-05-10 NOTE — Telephone Encounter (Signed)
Called patient and she stated that she has not requested an RX for Prednisone so she will call the pharmacy to inquire why they requested it.  Glennie Hawk, CMA .

## 2018-05-10 NOTE — Telephone Encounter (Signed)
Patient should be receiving this medication from Rheumatologist. I reviewed their last note.  Durward Parcel, DO [facility], PGY-3

## 2018-05-10 NOTE — Assessment & Plan Note (Signed)
Acute.  Resolved.  Patient tolerating 4L [place].   - Follow-up with pulmonology in 1 month

## 2018-05-10 NOTE — Patient Instructions (Signed)
Thank you for coming in to see [place] today. Please see below to review our plan for today's visit.  You seemed to have cleared the pneumonia.  Continue following her pulmonologist rheumatologist recommendations.  My recommendation for your cough is to use a tablespoon of honey 3 times daily as needed.  This may take quite a while to resolve especially given her postnasal drip.  You can consider over-the-counter fluticasone for allergies if they continue to bother you, otherwise I will see you in 2 months.  Please call the clinic at [phone] if your symptoms worsen or you have any concerns. It was our pleasure to serve you.  Durward Parcel, DO [facility], PGY-3

## 2018-05-25 ENCOUNTER — Ambulatory Visit (HOSPITAL_COMMUNITY)
Admission: RE | Admit: 2018-05-25 | Discharge: 2018-05-25 | Disposition: A | Discharge status: Home / Self Care | Payer: Medicare Other | Source: Ambulatory Visit | Attending: Cardiology | Admitting: Cardiology

## 2018-05-25 VITALS — BP 113/72 | HR 111 | Wt 139.2 lb

## 2018-05-25 DIAGNOSIS — Z79899 Other long term (current) drug therapy: Secondary | ICD-10-CM | POA: Insufficient documentation

## 2018-05-25 DIAGNOSIS — I2781 Cor pulmonale (chronic): Secondary | ICD-10-CM | POA: Diagnosis not present

## 2018-05-25 DIAGNOSIS — F419 Anxiety disorder, unspecified: Secondary | ICD-10-CM | POA: Insufficient documentation

## 2018-05-25 DIAGNOSIS — M069 Rheumatoid arthritis, unspecified: Secondary | ICD-10-CM | POA: Diagnosis not present

## 2018-05-25 DIAGNOSIS — I429 Cardiomyopathy, unspecified: Secondary | ICD-10-CM | POA: Insufficient documentation

## 2018-05-25 DIAGNOSIS — E119 Type 2 diabetes mellitus without complications: Secondary | ICD-10-CM | POA: Diagnosis not present

## 2018-05-25 DIAGNOSIS — J45909 Unspecified asthma, uncomplicated: Secondary | ICD-10-CM | POA: Diagnosis not present

## 2018-05-25 DIAGNOSIS — Z87891 Personal history of nicotine dependence: Secondary | ICD-10-CM | POA: Insufficient documentation

## 2018-05-25 DIAGNOSIS — I5022 Chronic systolic (congestive) heart failure: Secondary | ICD-10-CM | POA: Diagnosis not present

## 2018-05-25 DIAGNOSIS — F319 Bipolar disorder, unspecified: Secondary | ICD-10-CM | POA: Diagnosis not present

## 2018-05-25 DIAGNOSIS — K219 Gastro-esophageal reflux disease without esophagitis: Secondary | ICD-10-CM | POA: Insufficient documentation

## 2018-05-25 DIAGNOSIS — M329 Systemic lupus erythematosus, unspecified: Secondary | ICD-10-CM | POA: Insufficient documentation

## 2018-05-25 DIAGNOSIS — Z950 Presence of cardiac pacemaker: Secondary | ICD-10-CM | POA: Insufficient documentation

## 2018-05-25 DIAGNOSIS — E669 Obesity, unspecified: Secondary | ICD-10-CM | POA: Insufficient documentation

## 2018-05-25 DIAGNOSIS — Z794 Long term (current) use of insulin: Secondary | ICD-10-CM | POA: Insufficient documentation

## 2018-05-25 DIAGNOSIS — Q246 Congenital heart block: Secondary | ICD-10-CM | POA: Diagnosis not present

## 2018-05-25 DIAGNOSIS — I479 Paroxysmal tachycardia, unspecified: Secondary | ICD-10-CM | POA: Diagnosis not present

## 2018-05-25 DIAGNOSIS — J849 Interstitial pulmonary disease, unspecified: Secondary | ICD-10-CM | POA: Diagnosis not present

## 2018-05-25 DIAGNOSIS — I110 Hypertensive heart disease with heart failure: Secondary | ICD-10-CM | POA: Insufficient documentation

## 2018-05-25 MED ORDER — METOPROLOL SUCCINATE ER 25 MG PO TB24: 25.0000 mg | ORAL_TABLET | Freq: Two times a day (BID) | ORAL | 11 refills | Status: DC

## 2018-05-25 NOTE — Patient Instructions (Signed)
STOP Coreg START Toprol XL 25 mg, one tab twice a day   Your physician recommends that you schedule a follow-up appointment in: 3 months with Dr Shirlee Latch   Do the following things EVERYDAY: 1) Weigh yourself in the morning before breakfast. Write it down and keep it in a log. 2) Take your medicines as prescribed 3) Eat low salt foods-Limit salt (sodium) to 2000 mg per day.  4) Stay as active as you can everyday 5) Limit all fluids for the day to less than 2 liters

## 2018-05-26 ENCOUNTER — Other Ambulatory Visit (HOSPITAL_COMMUNITY): Payer: Self-pay | Admitting: Cardiology

## 2018-05-26 NOTE — Progress Notes (Signed)
Patient ID: Makayla Vasquez, female   DOB: 1981/10/18, 37 y.o.   MRN: [id number] PCP: Dr Makayla Vasquez Cardiology: Dr Makayla Vasquez  HPI: [name] is 37 y.o. 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   She has a h/o congenital HB and underwent first PPM at age 1. In 2011 had ECHO with EF 35-40%. LV dysfunction felt to be due to RV pacing so upgraded to CRT-P. Echo in 10/15 with EF 25%.  She also has concomitant lung disease and this has limited b-blocker dosing.  She follows with Dr. Maple Vasquez for her lung disease. She is a former smoker.  Admitted in 8/15 and 9/15 with worsening respiratory status and severe hypoxia. Had extensive w/u. Thought to have a viral pneumonitis complicated by atelectasis and mild edema vs. an autoimmune process. Discharged home on a course of levaquin and prednisone.   She was offered a lung biopsy for further diagnosis versus empiric treatment with steroids. She opted for empiric steroid treatment. She is followed by a rheumatologist and is on Makayla Vasquez, prednisone, and Makayla Vasquez.  She is on oxygen and night and with activity.   Admission in 38/17 for ?sepsis and management of interstitial lung disease.     She was admitted with PNA in 1/18.  Lasix, losartan, and bisoprolol stopped.   Last echo in 1/19 showed EF 45-50%, with mild to moderately decreased RV systolic function and moderate MR.    37 returns for followup of CHF and ILD. She is symptomatically stable.  No dyspnea walking on flat ground.  No lightheadedness. She has occasional palpitations.  No chest pain.  No orthopnea/PND.   Medtronic device interrogation: Optivol with fluid index > threshold but impedance rising.   PFTs (12/15) showed a severe restrictive defect concerning for an interstitial process.   EP study 10/15 for persistent tachycardia and found to have sinus tachycardia.   06/16/14  CT scan showed bilateral progressive lower peribronchial infiltrates with groundglass changes and nodular patter consistent with acute pneumonitis of unclear etiology.  06/20/14 BAL showed 60% macrophages and 30% PMNs. Her AFB smear, fungal smear, legionella Ab, pneumocystis smear, ACE, and sputum culture are negative. Her AFB, legionella, fungal, and BAL culture as well as her fungal Ab, hypersensitivity pneumonitis panel were all negative Serology: DsDNA, RF, ANCA, HSP all negative  RHC 10/26/2014  RA = 2 RV = 27/1/5 PA = 29/7 (18) PCW = 4 Fick cardiac output/index = 4.5/2.2 PVR = 3.0 WU FA sat = 98% PA sat = 64%, 61%  Labs (10/15): TSH normal K 3.6, creatinine 1.1 Labs (10/31/2014) L k 3.9 Creatinine 1.06  Labs 07/23/2015: K 3.8 Creatinine 0.82  Labs (3/17): K 4, creatinine 0.6 Labs (5/17): K 4.1, creatinine 0.7 Labs (1/18): K 3.6, creatinine 0.42 Labs (2/19): K 3.5, creatinine 0.77 Labs (6/19): K 3.8c, creatinine 0.77  Review of Systems: All systems reviewed and negative except as per HPI.   Past Medical History:  Diagnosis Date  . Anxiety   . Arthritis    rheumatoid arthritis- mild, no rheumatology care   . Asthma    seasonal allergies   . Asymptomatic LV dysfunction    a. Echo in Dec 2011 with EF 35 to 40%. Felt to be due to paced rhythm. b. EF 25-30% in 07/2014.  . Bipolar affective disorder (HCC)   . Cardiac pacemaker    a. Since age 37 in 73. b. Upgrade to  BiV in 37/2013.  [name] Carpal tunnel syndrome of right wrist   . CHF (congestive heart failure) (HCC)   . Congenital complete AV block   . Cor pulmonale, chronic (HCC) 04/08/2018  . Depression    bipolar  . Diabetes mellitus without complication (HCC)   . GERD (gastroesophageal reflux disease)   . Hypertension   . Interstitial lung disease (HCC)   . Lupus (HCC)   . Lupus (systemic lupus erythematosus) (HCC)   . Obesity   . Pneumonitis    a. a/w hypoxia - inflammatory - large workup 07/2014.  . Presence of permanent  cardiac pacemaker   . Seizures (HCC)    as a child- from high fever  . Sinus tachycardia     Current Outpatient Medications  Medication Sig Dispense Refill  . calcium-vitamin D (OSCAL WITH D) 500-200 MG-UNIT TABS tablet Take 1 tablet by mouth 2 (two) times daily with a meal.  11  . dicyclomine (Makayla Vasquez) 20 MG tablet Take 20 mg by mouth every 6 (six) hours.    [name] EASY TOUCH PEN NEEDLES 31G X 8 MM MISC USE AS DIRECTED 100 each 11  . Makayla (NEXPLANON [place]) Inject 1 application into the skin once.     . furosemide (LASIX) 20 MG tablet TAKE 1 TABLET BY MOUTH EVERY DAY 90 tablet 3  . Makayla Vasquez 100 UNIT/ML Vasquez Pen INJECT 15 UNITS SUBCUTANEOUSLY EVERY DAY 15 pen 2  . metFORMIN (GLUCOPHAGE) 1000 MG tablet Take 1 tablet (1,000 mg total) by mouth 2 (two) times daily with a meal. 90 tablet 3  . mycophenolate (Makayla Vasquez) 500 MG tablet Take 1,000 mg by mouth 2 (two) times daily.     [name] NOVOLOG FLEXPEN 100 UNIT/ML FlexPen INJECT 8 UNITS INTO THE SKIN 3 (THREE) TIMES DAILY WITH MEALS. 15 pen 5  . OXYGEN Inhale 4 L into the lungs.    . potassium chloride SA (KLOR-CON M20) 20 MEQ tablet Take 2 tablets (40 mEq total) by mouth daily. 60 tablet 3  . predniSONE (DELTASONE) 10 MG tablet Take 3 tabs (30 mg total) for 7 days, then 2 tabs ( 20 mg total) for 7 days, then 1 tab (10 mg total) for 7 days. 42 tablet 0  . Makayla Vasquez (PROGRAF) 0.5 MG capsule Take 1.5 mg by mouth 2 (two) times daily.    . traZODone (DESYREL) 50 MG tablet Take 1 tablet (50 mg total) by mouth at bedtime as needed for sleep. 30 tablet 3  . metoprolol succinate (TOPROL XL) 25 MG 24 hr tablet Take 1 tablet (25 mg total) by mouth 2 (two) times daily. 60 tablet 11   No current facility-administered medications for this encounter.     Allergies  Allergen Reactions  . 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 Sertraline Hcl Hives  . Tape Other (See Comments)     Burns skin      Social History   Socioeconomic History  . Marital status: Single    Spouse name: Not on file  . Number of children: 1  . Years of education: Not on file  . Highest education level: Not on file  Occupational History  . Occupation: CNA  Social Needs  . Financial resource strain: Not on file  . Food insecurity:    Worry: Not on file    Inability: Not on file  . Transportation needs:    Medical: Not on file  Non-medical: Not on file  Tobacco Use  . Smoking status: Former Smoker    Packs/day: 0.25    Years: 0.50    Pack years: 0.12    Types: Cigarettes    Last attempt to quit: 07/25/1996    Years since quitting: 21.8  . Smokeless tobacco: Never Used  Substance and Sexual Activity  . Alcohol use: No  . Drug use: No  . Sexual activity: Yes    Birth control/protection: Implant  Lifestyle  . Physical activity:    Days per week: Not on file    Minutes per session: Not on file  . Stress: Not on file  Relationships  . Social connections:    Talks on phone: Not on file    Gets together: Not on file    Attends religious service: Not on file    Active member of club or organization: Not on file    Attends meetings of clubs or organizations: Not on file    Relationship status: Not on file  . Intimate partner violence:    Fear of current or ex partner: Not on file    Emotionally abused: Not on file    Physically abused: Not on file    Forced sexual activity: Not on file  Other Topics Concern  . Not on file  Social History Narrative   Works at [employer].        Family History  Problem Relation Age of Onset  . Heart disease Mother        CHF (no details)  . Hypertension Mother   . Lupus Mother   . Heart disease Father        Murmur  . Heart disease Sister 36        No details.  History of a pacemaker    Vitals:   05/25/18 1450  BP: 113/72  Pulse: (!) 111  SpO2: 95%  Weight: 139 lb 3.2 oz (63.1 kg)    PHYSICAL EXAM: General: NAD Neck: No  JVD, no thyromegaly or thyroid nodule.  Lungs: Clear to auscultation bilaterally with normal respiratory effort. CV: Nondisplaced PMI.  Heart mildly tachy, regular S1/S2, no S3/S4, no murmur.  No peripheral edema.  No carotid bruit.  Normal pedal pulses.  Abdomen: Soft, nontender, no hepatosplenomegaly, no distention.  Skin: Intact without lesions or rashes.  Neurologic: Alert and oriented x 3.  Psych: Normal affect. Extremities: No clubbing or cyanosis.  HEENT: Normal.   ASSESSMENT & PLAN: 1) Chronic systolic HF:  Echo 05/2014 EF 20-25%, echo 1/19 improved with EF 45-50% and mild-moderately decreased systolic function. Nonischemic cardiomyopathy.  Medtronic CRT-P device.  She does not look volume overloaded on exam.  - Continue Lasix 20 mg daily.   - She can only tolerate a very low dose of Coreg.  Stop Coreg and start Toprol XL 25 mg bid to see if this controls HR better.    2) Anti-synthetase syndrome with interstitial lung disease: Suspect this also causes the persistent weakness.  She is followed by rheumatology and is on Makayla Vasquez, Makayla Vasquez, and prednisone.   3) Congenital AVN block s/p pacer: Has EP followup.   Followup in 3 months.   [name] 05/26/2018

## 2018-05-31 ENCOUNTER — Ambulatory Visit: Payer: Medicare Other | Admitting: Internal Medicine

## 2018-06-09 ENCOUNTER — Other Ambulatory Visit: Payer: Self-pay | Admitting: Cardiology

## 2018-06-12 ENCOUNTER — Other Ambulatory Visit: Payer: Self-pay

## 2018-06-12 ENCOUNTER — Emergency Department (HOSPITAL_COMMUNITY)
Admission: EM | Admit: 2018-06-12 | Discharge: 2018-06-12 | Disposition: A | Discharge status: Home / Self Care | Payer: Medicare Other | Attending: Emergency Medicine | Admitting: Emergency Medicine

## 2018-06-12 ENCOUNTER — Emergency Department (HOSPITAL_COMMUNITY): Payer: Medicare Other

## 2018-06-12 ENCOUNTER — Encounter (HOSPITAL_COMMUNITY): Payer: Self-pay | Admitting: *Deleted

## 2018-06-12 DIAGNOSIS — Z87891 Personal history of nicotine dependence: Secondary | ICD-10-CM | POA: Insufficient documentation

## 2018-06-12 DIAGNOSIS — J45909 Unspecified asthma, uncomplicated: Secondary | ICD-10-CM | POA: Diagnosis not present

## 2018-06-12 DIAGNOSIS — R05 Cough: Secondary | ICD-10-CM

## 2018-06-12 DIAGNOSIS — R059 Cough, unspecified: Secondary | ICD-10-CM

## 2018-06-12 DIAGNOSIS — I10 Essential (primary) hypertension: Secondary | ICD-10-CM | POA: Insufficient documentation

## 2018-06-12 DIAGNOSIS — R0602 Shortness of breath: Secondary | ICD-10-CM | POA: Diagnosis not present

## 2018-06-12 LAB — BASIC METABOLIC PANEL
ANION GAP: 12 (ref 5–15)
BUN: 14 mg/dL (ref 6–20)
CO2: 24 mmol/L (ref 22–32)
Calcium: 9.2 mg/dL (ref 8.9–10.3)
Chloride: 103 mmol/L (ref 98–111)
Creatinine, Ser: 0.92 mg/dL (ref 0.44–1.00)
GFR calc Af Amer: >60 mL/min (ref >60)
GLUCOSE: 134 mg/dL — AB (ref 70–99)
POTASSIUM: 4.1 mmol/L (ref 3.5–5.1)
Sodium: 139 mmol/L (ref 135–145)

## 2018-06-12 LAB — CBC
HEMATOCRIT: 38.7 % (ref 36.0–46.0)
Hemoglobin: 11.4 g/dL — ABNORMAL LOW (ref 12.0–15.0)
MCH: 24.6 pg — ABNORMAL LOW (ref 26.0–34.0)
MCHC: 29.5 g/dL — ABNORMAL LOW (ref 30.0–36.0)
MCV: 83.4 fL (ref 78.0–100.0)
PLATELETS: 318 10*3/uL (ref 150–400)
RBC: 4.64 MIL/uL (ref 3.87–5.11)
RDW: 15.3 % (ref 11.5–15.5)
WBC: 7.8 10*3/uL (ref 4.0–10.5)

## 2018-06-12 LAB — I-STAT TROPONIN, ED: Troponin i, poc: 0.04 ng/mL (ref 0.00–0.08)

## 2018-06-12 MED ORDER — AZITHROMYCIN 250 MG PO TABS: 250.0000 mg | ORAL_TABLET | Freq: Every day | ORAL | 0 refills | Status: DC

## 2018-06-12 NOTE — Discharge Instructions (Addendum)
You were evaluated in the Emergency Department and after careful evaluation, we did not find any emergent condition requiring admission or further testing in the hospital.  Your symptoms today seem to be due to possible pneumonia.  Please take the antibiotics as directed and follow-up with your primary care provider early next week.  Please return to the Emergency Department if you experience any worsening of your condition.  We encourage you to follow up with a primary care provider.  Thank you for allowing [place] to be a part of your care.

## 2018-06-12 NOTE — ED Triage Notes (Signed)
Pt reports she has had a cough for 1-2 weeks . Pt uses a concentrator for Nasal O2  And Pt reports O2 sats drop .   O2 sats 95 % . Pt o2 dependent at 2-4 liters.

## 2018-06-12 NOTE — ED Provider Notes (Signed)
[facility] Emergency Department Provider Note MRN:  [id number]  Arrival date & time: 06/12/18     Chief Complaint   Cough  History of Present Illness   Makayla Vasquez is a 37 y.o. year-old female with a history of interstitial lung disease, CHF, recurrent pneumonia, lupus presenting to the ED with chief complaint of cough.  Patient endorsing 2 to 3 weeks of persistent dry cough, occasionally followed by posttussive emesis.  Nonbloody nonbilious emesis.  Cough is getting worse, now accompanied by occasional shortness of breath, with bilateral rib soreness due to cough.  Has been using increasing amounts of oxygen at home, her baseline is 2 to 4 L via nasal cannula.  Denies headaches or vision changes, no chest pain, no fevers or chills, no abdominal pain, no dysuria.  Review of Systems  A complete 10 system review of systems was obtained and all systems are negative except as noted in the HPI and PMH.   Patient's Health History    Past Medical History:  Diagnosis Date  . Anxiety   . Arthritis    rheumatoid arthritis- mild, no rheumatology care   . Asthma    seasonal allergies   . Asymptomatic LV dysfunction    a. Echo in Dec 2011 with EF 35 to 40%. Felt to be due to paced rhythm. b. EF 25-30% in 07/2014.  . Bipolar affective disorder (HCC)   . Cardiac pacemaker    a. Since age 40 in 74. b. Upgrade to BiV in 2013.  [name] Carpal tunnel syndrome of right wrist   . CHF (congestive heart failure) (HCC)   . Congenital complete AV block   . Cor pulmonale, chronic (HCC) 04/08/2018  . Depression    bipolar  . Diabetes mellitus without complication (HCC)   . GERD (gastroesophageal reflux disease)   . Hypertension   . Interstitial lung disease (HCC)   . Lupus (HCC)   . Lupus (systemic lupus erythematosus) (HCC)   . Obesity   . Pneumonitis    a. a/w hypoxia - inflammatory - large workup 07/2014.  . Presence of permanent cardiac pacemaker   . Seizures (HCC)    as a  child- from high fever  . Sinus tachycardia     Past Surgical History:  Procedure Laterality Date  . ATRIAL TACH ABLATION N/A 08/14/2014   Procedure: ATRIAL TACH ABLATION;  Surgeon: Marinus Maw, MD;  Location: [facility] CATH LAB;  Service: Cardiovascular;  Laterality: N/A;  . BI-VENTRICULAR PACEMAKER UPGRADE N/A 03/08/2012   Procedure: BI-VENTRICULAR PACEMAKER UPGRADE;  Surgeon: Marinus Maw, MD;  Location: [facility] CATH LAB;  Service: Cardiovascular;  Laterality: N/A;  . CARPAL TUNNEL WITH CUBITAL TUNNEL Right 07/26/2013   Procedure: RIGHT LIMITED OPEN CARPAL TUNNEL RELEASE ,  RIGHT CUBITAL TUNNEL RELEASE, INSITU VERSES ULNAR NERVE DECOMPRESSION AND ANTERIOR TRANSPOSITION;  Surgeon: Dominica Severin, MD;  Location: [facility] OR;  Service: Orthopedics;  Laterality: Right;  . CESAREAN SECTION    . CHOLECYSTECTOMY    . CYST EXCISION  12/10/2012   THYROID  . INSERT / REPLACE / REMOVE PACEMAKER     2001  . IUD REMOVAL  11/03/2011   Procedure: INTRAUTERINE DEVICE (IUD) REMOVAL;  Surgeon: Myra C. Marice Potter, MD;  Location: [facility] ORS;  Service: Gynecology;  Laterality: N/A;  . NASAL FRACTURE SURGERY     /w plate   . RIGHT HEART CATHETERIZATION N/A 10/26/2014   Procedure: RIGHT HEART CATH;  Surgeon: Dolores Patty, MD;  Location: [facility] CATH LAB;  Service: Cardiovascular;  Laterality: N/A;  . THROAT SURGERY  1994   s/p laser treatment  . THYROGLOSSAL DUCT CYST N/A 12/10/2012   Procedure: REVISION OF THYROGLOSSAL DUCT CYST EXCISION;  Surgeon: Serena Colonel, MD;  Location: [facility] OR;  Service: ENT;  Laterality: N/A;  Revision of Thyroglossal Duct Cyst Excision  . VIDEO BRONCHOSCOPY Bilateral 06/19/2014   Procedure: VIDEO BRONCHOSCOPY WITHOUT FLUORO;  Surgeon: Kalman Shan, MD;  Location: [facility] ENDOSCOPY;  Service: Cardiopulmonary;  Laterality: Bilateral;    Family History  Problem Relation Age of Onset  . Heart disease Mother        CHF (no details)  . Hypertension Mother   . Lupus Mother   . Heart disease Father        Murmur    . Heart disease Sister 57        No details.  History of a pacemaker    Social History   Socioeconomic History  . Marital status: Single    Spouse name: Not on file  . Number of children: 1  . Years of education: Not on file  . Highest education level: Not on file  Occupational History  . Occupation: CNA  Social Needs  . Financial resource strain: Not on file  . Food insecurity:    Worry: Not on file    Inability: Not on file  . Transportation needs:    Medical: Not on file    Non-medical: Not on file  Tobacco Use  . Smoking status: Former Smoker    Packs/day: 0.25    Years: 0.50    Pack years: 0.12    Types: Cigarettes    Last attempt to quit: 07/25/1996    Years since quitting: 21.8  . Smokeless tobacco: Never Used  Substance and Sexual Activity  . Alcohol use: No  . Drug use: No  . Sexual activity: Yes    Birth control/protection: Implant  Lifestyle  . Physical activity:    Days per week: Not on file    Minutes per session: Not on file  . Stress: Not on file  Relationships  . Social connections:    Talks on phone: Not on file    Gets together: Not on file    Attends religious service: Not on file    Active member of club or organization: Not on file    Attends meetings of clubs or organizations: Not on file    Relationship status: Not on file  . Intimate partner violence:    Fear of current or ex partner: Not on file    Emotionally abused: Not on file    Physically abused: Not on file    Forced sexual activity: Not on file  Other Topics Concern  . Not on file  Social History Narrative   Works at [employer].       Physical Exam  Vital Signs and Nursing Notes reviewed Vitals:   06/12/18 1246 06/12/18 1256  BP: 119/79   Pulse: 69   Resp: 20   Temp: 98.7 F (37.1 C)   SpO2: 96% 94%    CONSTITUTIONAL: Well-appearing, NAD NEURO:  Alert and oriented x 3, no focal deficits EYES:  eyes equal and reactive ENT/NECK:  no LAD, no JVD CARDIO: Regular rate,  well-perfused, normal S1 and S2 PULM:  CTAB no wheezing or rhonchi GI/GU:  normal bowel sounds, non-distended, non-tender MSK/SPINE:  No gross deformities, no edema SKIN:  no rash, atraumatic PSYCH:  Appropriate speech and behavior  Diagnostic and Interventional  Summary    EKG Interpretation  Date/Time:  Saturday June 12 2018 12:46:50 EDT Ventricular Rate:  70 PR Interval:    QRS Duration: 90 QT Interval:  442 QTC Calculation: 477 R Axis:   57 Text Interpretation:  Ventricular-paced rhythm Abnormal ECG Confirmed by Kennis Carina [phone]) on 06/12/2018 3:43:22 PM      Labs Reviewed  CBC - Abnormal; Notable for the following components:      Result Value   Hemoglobin 11.4 (*)    MCH 24.6 (*)    MCHC 29.5 (*)    All other components within normal limits  BASIC METABOLIC PANEL - Abnormal; Notable for the following components:   Glucose, Bld 134 (*)    All other components within normal limits  I-STAT TROPONIN, ED    DG Chest 2 View  Final Result      Medications - No data to display   Procedures Critical Care  ED Course and Medical Decision Making  I have reviewed the triage vital signs and the nursing notes.  Pertinent labs & imaging results that were available during my care of the patient were reviewed by me and considered in my medical decision making (see below for details).    Persistent cough with posttussive emesis in this 37 year old female with history of interstitial lung disease, chronic O2 requirement.  Clinical concern for pneumonia.  Chest pain is bilateral on the lateral sides of the ribs consistent with soreness related to coughing.  Little to no concern for ACS.  Not tachycardic, satting well on room air, no signs of DVT, little to no concern for PE at this time.  Chest x-ray with no obvious focal process but patient also with likely mildly dehydrated, possibly masking a pneumonia, which she clinically appears to have.  Given this and her chronic medical  problems, will cover with azithromycin and encourage prompt PCP follow-up.  After the discussed management above, the patient was determined to be safe for discharge.  The patient was in agreement with this plan and all questions regarding their care were answered.  ED return precautions were discussed and the patient will return to the ED with any significant worsening of condition.  Elmer Sow. Pilar Plate, MD [facility] [email or web address]  Final Clinical Impressions(s) / ED Diagnoses     ICD-10-CM   1. Cough R05 DG Chest 2 View    DG Chest 2 View    ED Discharge Orders         Ordered    azithromycin (ZITHROMAX) 250 MG tablet  Daily     06/12/18 1543             Sabas Sous, MD 06/12/18 1546

## 2018-06-17 ENCOUNTER — Ambulatory Visit: Payer: Medicare Other | Admitting: Family Medicine

## 2018-06-18 ENCOUNTER — Other Ambulatory Visit (INDEPENDENT_AMBULATORY_CARE_PROVIDER_SITE_OTHER): Payer: Medicare Other

## 2018-06-18 ENCOUNTER — Ambulatory Visit (INDEPENDENT_AMBULATORY_CARE_PROVIDER_SITE_OTHER): Payer: Medicare Other | Admitting: Primary Care

## 2018-06-18 ENCOUNTER — Encounter: Payer: Self-pay | Admitting: Primary Care

## 2018-06-18 VITALS — BP 100/60 | HR 103 | Temp 97.1°F | Ht 65.0 in | Wt 134.4 lb

## 2018-06-18 DIAGNOSIS — R058 Other specified cough: Secondary | ICD-10-CM

## 2018-06-18 DIAGNOSIS — I5022 Chronic systolic (congestive) heart failure: Secondary | ICD-10-CM

## 2018-06-18 DIAGNOSIS — R05 Cough: Secondary | ICD-10-CM

## 2018-06-18 DIAGNOSIS — J9611 Chronic respiratory failure with hypoxia: Secondary | ICD-10-CM

## 2018-06-18 LAB — CBC WITH DIFFERENTIAL/PLATELET
BASOS PCT: 1.1 % (ref 0.0–3.0)
Basophils Absolute: 0.1 10*3/uL (ref 0.0–0.1)
EOS PCT: 4.6 % (ref 0.0–5.0)
Eosinophils Absolute: 0.3 10*3/uL (ref 0.0–0.7)
HCT: 33.7 % — ABNORMAL LOW (ref 36.0–46.0)
Hemoglobin: 10.7 g/dL — ABNORMAL LOW (ref 12.0–15.0)
LYMPHS ABS: 1.4 10*3/uL (ref 0.7–4.0)
Lymphocytes Relative: 23.1 % (ref 12.0–46.0)
MCHC: 31.7 g/dL (ref 30.0–36.0)
MCV: 79.2 fl (ref 78.0–100.0)
MONO ABS: 0.5 10*3/uL (ref 0.1–1.0)
Monocytes Relative: 7.9 % (ref 3.0–12.0)
NEUTROS ABS: 3.8 10*3/uL (ref 1.4–7.7)
NEUTROS PCT: 63.3 % (ref 43.0–77.0)
PLATELETS: 359 10*3/uL (ref 150.0–400.0)
RBC: 4.26 Mil/uL (ref 3.87–5.11)
RDW: 16.4 % — AB (ref 11.5–15.5)
WBC: 6 10*3/uL (ref 4.0–10.5)

## 2018-06-18 LAB — POCT EXHALED NITRIC OXIDE: FENO LEVEL (PPB): 11

## 2018-06-18 MED ORDER — BENZONATATE 200 MG PO CAPS: 200.0000 mg | ORAL_CAPSULE | Freq: Three times a day (TID) | ORAL | 1 refills | Status: DC | PRN

## 2018-06-18 MED ORDER — AZELASTINE-FLUTICASONE 137-50 MCG/ACT NA SUSP: NASAL | 1 refills | Status: DC

## 2018-06-18 NOTE — Assessment & Plan Note (Signed)
Follows with Rheumatology Continues prednsione 7.5mg , Prograf, Cellcept, Bactrim MWF

## 2018-06-18 NOTE — Assessment & Plan Note (Signed)
Continues to use home oxygen, baseline 2-4L  O2 sat 96% today

## 2018-06-18 NOTE — Assessment & Plan Note (Signed)
Not currently on therapy Recommended omeprazole bid before meals

## 2018-06-18 NOTE — Progress Notes (Signed)
@Patient  ID: , female    DOB: 1981/10/03, 37 y.o.   MRN: [id number]  Chief Complaint  Patient presents with  . Acute Visit    pt stated pneumonia last saturday (ED), cough with clear to yellow mucus for 3 weeks    Referring provider: Monday, DO  HPI: 37 year old female, former smoker. 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 Patient of Dr. 20, last seen by TP on 12/17/2017. Recent ED visit for cought  ED course: Patient presented to ED on 08/24 with dry cough x 2-3 weeks. Associated posttussive emesis. Occasional shortness of breath and rib pain. Using increased oxygen, baseline is 2-4L [place]. VSS. Trop negative. CXR with no obvious focal process. No clinical concern for DVT/PE. Covered with azithromycin and follow up with PCP.   06/18/2018 Patient presents today for acute visit, complains of cough for 3 weeks. Described as coughing fits, typically dry but occasionally she will bring up yellow mucus. Associated nasal drainage. She has not taken Flonase, claritin or cough medication. Gets heartburn symptoms once a month if she eats something spicy. Takes bentyl three times a day. Using 4L oxygen, which is top of her baseline. Takes 7.5mg  prednisone daily per rheumatology. Reports that she's lost weight. No leg swelling. Taking lasix 40mg  daily, increased 1 month ago by cardiologist Dr. 06/20/2018. Has a follow up in September. Afebrile.    Allergies  Allergen Reactions  . 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 Sertraline Hcl Hives  . Tape Other (See Comments)    Burns skin    Immunization History  Administered Date(s) Administered  . Influenza Split 12/25/2011, 09/27/2012  . Influenza,inj,Quad PF,6+ Mos 10/28/2013, 08/07/2014, 09/29/2016  . Influenza-Unspecified 08/20/2015  . PPD Test 05/18/2012, 09/10/2012, 03/27/2014,  02/12/2015, 03/03/2017  . Pneumococcal Polysaccharide-23 02/16/2015  . Td 03/20/2006    Past Medical History:  Diagnosis Date  . Anxiety   . Arthritis    rheumatoid arthritis- mild, no rheumatology care   . Asthma    seasonal allergies   . Asymptomatic LV dysfunction    a. Echo in Dec 2011 with EF 35 to 40%. Felt to be due to paced rhythm. b. EF 25-30% in 07/2014.  . Bipolar affective disorder (HCC)   . Cardiac pacemaker    a. Since age 70 in 4. b. Upgrade to BiV in 2013.  4 Carpal tunnel syndrome of right wrist   . CHF (congestive heart failure) (HCC)   . Congenital complete AV block   . Cor pulmonale, chronic (HCC) 04/08/2018  . Depression    bipolar  . Diabetes mellitus without complication (HCC)   . GERD (gastroesophageal reflux disease)   . Hypertension   . Interstitial lung disease (HCC)   . Lupus (HCC)   . Lupus (systemic lupus erythematosus) (HCC)   . Obesity   . Pneumonitis    a. a/w hypoxia - inflammatory - large workup 07/2014.  . Presence of permanent cardiac pacemaker   . Seizures (HCC)    as a child- from high fever  . Sinus tachycardia     Tobacco History: Social History   Tobacco Use  Smoking Status Former Smoker  . Packs/day: 0.25  . Years: 0.50  . Pack years: 0.12  . Types: Cigarettes  . Last attempt to quit: 07/25/1996  . Years since quitting: 21.9  Smokeless Tobacco Never Used  Counseling given: Not Answered   Outpatient Medications Prior to Visit  Medication Sig Dispense Refill  . azithromycin (ZITHROMAX) 250 MG tablet Take 1 tablet (250 mg total) by mouth daily. Take first 2 tablets together, then 1 every day until finished. 6 tablet 0  . calcium-vitamin D (OSCAL WITH D) 500-200 MG-UNIT TABS tablet Take 1 tablet by mouth 2 (two) times daily with a meal.  11  . dicyclomine (BENTYL) 20 MG tablet Take 20 mg by mouth every 6 (six) hours.    [name] EASY TOUCH PEN NEEDLES 31G X 8 MM MISC USE AS DIRECTED 100 each 11  . Etonogestrel (NEXPLANON  Strong) Inject 1 application into the skin once.     . furosemide (LASIX) 20 MG tablet TAKE 1 TABLET BY MOUTH EVERY DAY 90 tablet 3  . LANTUS SOLOSTAR 100 UNIT/ML Solostar Pen INJECT 15 UNITS SUBCUTANEOUSLY EVERY DAY 15 pen 2  . metFORMIN (GLUCOPHAGE) 1000 MG tablet Take 1 tablet (1,000 mg total) by mouth 2 (two) times daily with a meal. 90 tablet 3  . metoprolol succinate (TOPROL XL) 25 MG 24 hr tablet Take 1 tablet (25 mg total) by mouth 2 (two) times daily. 60 tablet 11  . mycophenolate (CELLCEPT) 500 MG tablet Take 1,000 mg by mouth 2 (two) times daily.     [name] NOVOLOG FLEXPEN 100 UNIT/ML FlexPen INJECT 8 UNITS INTO THE SKIN 3 (THREE) TIMES DAILY WITH MEALS. 15 pen 5  . OXYGEN Inhale 4 L into the lungs.    . potassium chloride SA (K-DUR,KLOR-CON) 20 MEQ tablet TAKE 2 TABLETS BY MOUTH ONCE DAILY 60 tablet 3  . predniSONE (DELTASONE) 10 MG tablet Take 3 tabs (30 mg total) for 7 days, then 2 tabs ( 20 mg total) for 7 days, then 1 tab (10 mg total) for 7 days. 42 tablet 0  . sulfamethoxazole-trimethoprim (BACTRIM DS,SEPTRA DS) 800-160 MG tablet Take 1 tablet by mouth 3 (three) times a week. Monday, Wednesday and Friday    . tacrolimus (PROGRAF) 0.5 MG capsule Take 1.5 mg by mouth 2 (two) times daily.    . traZODone (DESYREL) 50 MG tablet Take 1 tablet (50 mg total) by mouth at bedtime as needed for sleep. 30 tablet 3   No facility-administered medications prior to visit.     Review of Systems  Review of Systems  Constitutional: Negative.   HENT: Positive for postnasal drip and rhinorrhea. Negative for sinus pressure and sinus pain.   Respiratory: Positive for cough. Negative for wheezing.   Cardiovascular: Negative.     Physical Exam  BP 100/60 (BP Location: Left Arm, Cuff Size: Normal)   Pulse (!) 103   Temp (!) 97.1 F (36.2 C)   Ht 5\' 5"  (1.651 m)   Wt 134 lb 6.4 oz (61 kg)   SpO2 96%   BMI 22.37 kg/m    Physical Exam  Constitutional: She is oriented to person, place, and time.  She appears well-developed and well-nourished. No distress.  HENT:  Head: Normocephalic and atraumatic.  Eyes: Pupils are equal, round, and reactive to light. EOM are normal.  Neck: Normal range of motion. Neck supple.  Cardiovascular: Regular rhythm.  No leg swelling   Pulmonary/Chest: Effort normal. She has no wheezes. She has no rales.  CTA, 96% 4L [place]  Neurological: She is alert and oriented to person, place, and time.  Skin: Skin is warm and dry.  Psychiatric: She has a normal mood and affect. Her behavior is normal. Judgment and thought  content normal.     Lab Results:  CBC    Component Value Date/Time   WBC 7.8 06/12/2018 1442   RBC 4.64 06/12/2018 1442   HGB 11.4 (L) 06/12/2018 1442   HGB 11.8 07/15/2017 1522   HCT 38.7 06/12/2018 1442   HCT 35.4 07/15/2017 1522   PLT 318 06/12/2018 1442   PLT 369 07/15/2017 1522   MCV 83.4 06/12/2018 1442   MCV 77 (L) 07/15/2017 1522   MCH 24.6 (L) 06/12/2018 1442   MCHC 29.5 (L) 06/12/2018 1442   RDW 15.3 06/12/2018 1442   RDW 16.1 (H) 07/15/2017 1522   LYMPHSABS 1.3 12/01/2017 1036   LYMPHSABS 1.3 07/15/2017 1522   MONOABS 0.5 12/01/2017 1036   EOSABS 0.3 12/01/2017 1036   EOSABS 0.1 07/15/2017 1522   BASOSABS 0.0 12/01/2017 1036   BASOSABS 0.0 07/15/2017 1522    BMET    Component Value Date/Time   NA 139 06/12/2018 1442   NA 138 07/15/2017 1522   K 4.1 06/12/2018 1442   CL 103 06/12/2018 1442   CO2 24 06/12/2018 1442   GLUCOSE 134 (H) 06/12/2018 1442   BUN 14 06/12/2018 1442   BUN 9 07/15/2017 1522   CREATININE 0.92 06/12/2018 1442   CREATININE 0.42 (L) 11/11/2016 1056   CALCIUM 9.2 06/12/2018 1442   GFRNONAA >60 06/12/2018 1442   GFRNONAA >89 11/11/2016 1056   GFRAA >60 06/12/2018 1442   GFRAA >89 11/11/2016 1056    BNP    Component Value Date/Time   BNP 15.7 04/07/2018 2334    ProBNP    Component Value Date/Time   PROBNP 11.0 07/06/2014 1431    Imaging: Dg Chest 2 View  Result Date:  06/12/2018 CLINICAL DATA:  Productive cough for 10 days. EXAM: CHEST - 2 VIEW COMPARISON:  Chest CT 04/08/2018 and chest radiograph 02/23/2018 FINDINGS: Diffuse parenchymal lung densities are unchanged from the most recent comparison imaging. Heart size is within normal limits and stable. Again noted is a biventricular cardiac pacemaker. No large pleural effusions. Bony structures are unremarkable. Negative for a pneumothorax. IMPRESSION: Chronic parenchymal lung densities bilaterally. No significant change from the recent comparison imaging. Stable appearance of the cardiac pacemaker. Electronically Signed   By: Richarda Overlie M.D.   On: 06/12/2018 15:12     Assessment & Plan:   Chronic respiratory failure with hypoxia (HCC) Continues to use home oxygen, baseline 2-4L  O2 sat 96% today   GERD (gastroesophageal reflux disease) Not currently on therapy Recommended omeprazole bid before meals   Systemic lupus erythematosus (HCC) Follows with Rheumatology Continues prednsione 7.5mg , Prograf, Cellcept, Bactrim MWF  Upper airway cough syndrome Dry irritating cough x 3 weeks. Little production.  CXR with no focal consolidation, diffuse parenchymal lung densities unchanged from previous  FENO 11 Checking sputum culture and cbc with diff today Recommend delsym twice a day, tessalon perles TID, GERD therapy and Dymista nasal spray  FU in 2 weeks      Glenford Bayley, NP 06/18/2018

## 2018-06-18 NOTE — Assessment & Plan Note (Addendum)
Dry irritating cough x 3 weeks. Little production.  CXR with no focal consolidation, diffuse parenchymal lung densities unchanged from previous  FENO 11 Checking sputum culture and cbc with diff today Recommend delsym twice a day, tessalon perles TID, GERD therapy and Dymista nasal spray  FU in 2 weeks

## 2018-06-18 NOTE — Patient Instructions (Addendum)
Checking sputum culture and labs today   Cough: Delsym cough syrup twice a day (samples given, pick up over the counter) Tessalon perle TID for cough (Sent to pharmacy)   Nasal congestion: Prescription nasal spray (sent to pharmacy)  GERD: Omeprazole twice a day before meals (over the counter)  Follow up: 2 weeks with Makayla Sandy, NP

## 2018-06-28 ENCOUNTER — Encounter (HOSPITAL_COMMUNITY): Payer: Medicare Other | Admitting: Cardiology

## 2018-07-01 ENCOUNTER — Telehealth: Payer: Self-pay

## 2018-07-01 ENCOUNTER — Other Ambulatory Visit (HOSPITAL_COMMUNITY): Payer: Self-pay | Admitting: Cardiology

## 2018-07-01 ENCOUNTER — Encounter: Payer: Medicare Other | Admitting: *Deleted

## 2018-07-01 NOTE — Telephone Encounter (Signed)
LMOVM reminding pt to send remote transmission.   

## 2018-07-01 NOTE — Progress Notes (Deleted)
@Patient  ID: , female    DOB: 06-04-1981, 37 y.o.   MRN: [id number]  No chief complaint on file.   Referring provider: [id number], DO  HPI:  37 year old female, former smoker. 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 Patient of Dr. 20, last seen by TP on 12/17/2017. Recent ED visit for cough  ED course: Patient presented to ED on 08/24 with dry cough x 2-3 weeks. Associated posttussive emesis. Occasional shortness of breath and rib pain. Using increased oxygen, baseline is 2-4L [place]. VSS. Trop negative. CXR with no obvious focal process. No clinical concern for DVT/PE. Covered with azithromycin and follow up with PCP.   06/18/2018 Patient presents today for acute visit, complains of cough for 3 weeks. Described as coughing fits, typically dry but occasionally she will bring up yellow mucus. Associated nasal drainage. She has not taken Flonase, claritin or cough medication. Gets heartburn symptoms once a month if she eats something spicy. Takes bentyl three times a day. Using 4L oxygen, which is top of her baseline. Takes 7.5mg  prednisone daily per rheumatology. Reports that she's lost weight. No leg swelling. Taking lasix 40mg  daily, increased 1 month ago by cardiologist Dr. 06/20/2018. Has a follow up in September. Afebrile.    Started on omeprazole BID WBC wnl FENO 11 Ordered sputum culture  CXR 08/24- no focal consolidation, pleural effusion. Diffuse parenchymal lung densities are unchanged from the most recent comparison   Allergies  Allergen Reactions  . 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 Sertraline Hcl Hives  . Tape Other (See Comments)    Burns skin    Immunization History  Administered Date(s) Administered  . Influenza Split 12/25/2011, 09/27/2012  . Influenza,inj,Quad PF,6+ Mos 10/28/2013, 08/07/2014,  09/29/2016  . Influenza-Unspecified 08/20/2015  . PPD Test 05/18/2012, 09/10/2012, 03/27/2014, 02/12/2015, 03/03/2017  . Pneumococcal Polysaccharide-23 02/16/2015  . Td 03/20/2006    Past Medical History:  Diagnosis Date  . Anxiety   . Arthritis    rheumatoid arthritis- mild, no rheumatology care   . Asthma    seasonal allergies   . Asymptomatic LV dysfunction    a. Echo in Dec 2011 with EF 35 to 40%. Felt to be due to paced rhythm. b. EF 25-30% in 07/2014.  . Bipolar affective disorder (HCC)   . Cardiac pacemaker    a. Since age 3 in 80. b. Upgrade to BiV in 2013.  4 Carpal tunnel syndrome of right wrist   . CHF (congestive heart failure) (HCC)   . Congenital complete AV block   . Cor pulmonale, chronic (HCC) 04/08/2018  . Depression    bipolar  . Diabetes mellitus without complication (HCC)   . GERD (gastroesophageal reflux disease)   . Hypertension   . Interstitial lung disease (HCC)   . Lupus (HCC)   . Lupus (systemic lupus erythematosus) (HCC)   . Obesity   . Pneumonitis    a. a/w hypoxia - inflammatory - large workup 07/2014.  . Presence of permanent cardiac pacemaker   . Seizures (HCC)    as a child- from high fever  . Sinus tachycardia     Tobacco History: Social History   Tobacco Use  Smoking Status Former Smoker  . Packs/day: 0.25  . Years: 0.50  . Pack years: 0.12  . Types: Cigarettes  . Last attempt to quit: 07/25/1996  . Years  since quitting: 21.9  Smokeless Tobacco Never Used   Counseling given: Not Answered   Outpatient Medications Prior to Visit  Medication Sig Dispense Refill  . Azelastine-Fluticasone 137-50 MCG/ACT SUSP 1 spray per nostril twice a day 1 Bottle 1  . azithromycin (ZITHROMAX) 250 MG tablet Take 1 tablet (250 mg total) by mouth daily. Take first 2 tablets together, then 1 every day until finished. 6 tablet 0  . benzonatate (TESSALON) 200 MG capsule Take 1 capsule (200 mg total) by mouth 3 (three) times daily as needed for  cough. 30 capsule 1  . calcium-vitamin D (OSCAL WITH D) 500-200 MG-UNIT TABS tablet Take 1 tablet by mouth 2 (two) times daily with a meal.  11  . dicyclomine (BENTYL) 20 MG tablet Take 20 mg by mouth every 6 (six) hours.    [name] EASY TOUCH PEN NEEDLES 31G X 8 MM MISC USE AS DIRECTED 100 each 11  . Etonogestrel (NEXPLANON [place]) Inject 1 application into the skin once.     . furosemide (LASIX) 20 MG tablet TAKE 1 TABLET BY MOUTH EVERY DAY 90 tablet 3  . LANTUS SOLOSTAR 100 UNIT/ML Solostar Pen INJECT 15 UNITS SUBCUTANEOUSLY EVERY DAY 15 pen 2  . metFORMIN (GLUCOPHAGE) 1000 MG tablet Take 1 tablet (1,000 mg total) by mouth 2 (two) times daily with a meal. 90 tablet 3  . metoprolol succinate (TOPROL XL) 25 MG 24 hr tablet Take 1 tablet (25 mg total) by mouth 2 (two) times daily. 60 tablet 11  . mycophenolate (CELLCEPT) 500 MG tablet Take 1,000 mg by mouth 2 (two) times daily.     [name] NOVOLOG FLEXPEN 100 UNIT/ML FlexPen INJECT 8 UNITS INTO THE SKIN 3 (THREE) TIMES DAILY WITH MEALS. 15 pen 5  . OXYGEN Inhale 4 L into the lungs.    . potassium chloride SA (K-DUR,KLOR-CON) 20 MEQ tablet TAKE 2 TABLETS BY MOUTH ONCE DAILY 60 tablet 3  . predniSONE (DELTASONE) 10 MG tablet Take 3 tabs (30 mg total) for 7 days, then 2 tabs ( 20 mg total) for 7 days, then 1 tab (10 mg total) for 7 days. 42 tablet 0  . sulfamethoxazole-trimethoprim (BACTRIM DS,SEPTRA DS) 800-160 MG tablet Take 1 tablet by mouth 3 (three) times a week. Monday, Wednesday and Friday    . tacrolimus (PROGRAF) 0.5 MG capsule Take 1.5 mg by mouth 2 (two) times daily.    . traZODone (DESYREL) 50 MG tablet Take 1 tablet (50 mg total) by mouth at bedtime as needed for sleep. 30 tablet 3   No facility-administered medications prior to visit.       Review of Systems  Review of Systems   Physical Exam  There were no vitals taken for this visit. Physical Exam   Lab Results:  CBC    Component Value Date/Time   WBC 6.0 06/18/2018 1018   RBC  4.26 06/18/2018 1018   HGB 10.7 (L) 06/18/2018 1018   HGB 11.8 07/15/2017 1522   HCT 33.7 (L) 06/18/2018 1018   HCT 35.4 07/15/2017 1522   PLT 359.0 06/18/2018 1018   PLT 369 07/15/2017 1522   MCV 79.2 06/18/2018 1018   MCV 77 (L) 07/15/2017 1522   MCH 24.6 (L) 06/12/2018 1442   MCHC 31.7 06/18/2018 1018   RDW 16.4 (H) 06/18/2018 1018   RDW 16.1 (H) 07/15/2017 1522   LYMPHSABS 1.4 06/18/2018 1018   LYMPHSABS 1.3 07/15/2017 1522   MONOABS 0.5 06/18/2018 1018   EOSABS 0.3 06/18/2018 1018  EOSABS 0.1 07/15/2017 1522   BASOSABS 0.1 06/18/2018 1018   BASOSABS 0.0 07/15/2017 1522    BMET    Component Value Date/Time   NA 139 06/12/2018 1442   NA 138 07/15/2017 1522   K 4.1 06/12/2018 1442   CL 103 06/12/2018 1442   CO2 24 06/12/2018 1442   GLUCOSE 134 (H) 06/12/2018 1442   BUN 14 06/12/2018 1442   BUN 9 07/15/2017 1522   CREATININE 0.92 06/12/2018 1442   CREATININE 0.42 (L) 11/11/2016 1056   CALCIUM 9.2 06/12/2018 1442   GFRNONAA >60 06/12/2018 1442   GFRNONAA >89 11/11/2016 1056   GFRAA >60 06/12/2018 1442   GFRAA >89 11/11/2016 1056    BNP    Component Value Date/Time   BNP 15.7 04/07/2018 2334    ProBNP    Component Value Date/Time   PROBNP 11.0 07/06/2014 1431    Imaging: Dg Chest 2 View  Result Date: 06/12/2018 CLINICAL DATA:  Productive cough for 10 days. EXAM: CHEST - 2 VIEW COMPARISON:  Chest CT 04/08/2018 and chest radiograph 02/23/2018 FINDINGS: Diffuse parenchymal lung densities are unchanged from the most recent comparison imaging. Heart size is within normal limits and stable. Again noted is a biventricular cardiac pacemaker. No large pleural effusions. Bony structures are unremarkable. Negative for a pneumothorax. IMPRESSION: Chronic parenchymal lung densities bilaterally. No significant change from the recent comparison imaging. Stable appearance of the cardiac pacemaker. Electronically Signed   By: Richarda Overlie M.D.   On: 06/12/2018 15:12      Assessment & Plan:   No problem-specific Assessment & Plan notes found for this encounter.     Glenford Bayley, NP 07/01/2018

## 2018-07-02 ENCOUNTER — Encounter: Payer: Self-pay | Admitting: Cardiology

## 2018-07-02 ENCOUNTER — Ambulatory Visit: Payer: Medicare Other | Admitting: Pulmonary Disease

## 2018-07-02 ENCOUNTER — Ambulatory Visit: Payer: Medicare Other | Admitting: Primary Care

## 2018-07-02 NOTE — Progress Notes (Deleted)
@Patient  ID: Makayla Vasquez, female    DOB: 09-23-1981, 37 y.o.   MRN: [id number]  No chief complaint on file.   Referring provider: Delphos Bing, DO  HPI:  37 year old female former smoker followed in our office for ILD, Asthmatic bronchitis, and Lupus   PMH: ILD, Lupus, Cardiomyopathy, congenital heartblock, s/p pacemaker placement  Smoker/ Smoking History: Former smoker. 1 pack year.  Maintenance:  Cellcept, prograf, 7.74m prednisone Pt of: Dr. YAnnamaria Boots  Recent [facility] Encounters:   ED course: Patient presented to ED on 08/24 with dry cough x 2-3 weeks. Associated posttussive emesis. Occasional shortness of breath and rib pain. Using increased oxygen, baseline is 2-4L [place]. VSS. Trop negative. CXR with no obvious focal process. No clinical concern for DVT/PE. Covered with azithromycin and follow up with PCP.   06/18/2018- OV - BW  Patient presenting today for acute visit complains of cough for 3 weeks.  Coughing.  Also gets heartburn symptoms.  Associated with nasal drainage.  Take 7.5 mg of prednisone daily per rheumatology. Plan: Continue home O2 2 to 4 L, start omeprazole twice daily, continue follow-up with rheumatology, continue prednisone 7.5, Prograf, CellCept, sputum culture, CBC with differential today, chest x-ray  07/02/2018  - Visit   HPI  Tests:   06/18/18 - FENO - 11  06/18/2018-sputum culture >>> 06/18/2018-CBC with differential - mild anemia   06/12/2018-chest x-ray-Chronic parenchymal lung densities bilaterally, no acute changes   11/09/2017- 50 percent EF, echocardiogram PA peak pressure 39, grade 1 diastolic dysfunction percent  12/23/2016-pulmonary function test- severe restriction, severe diffusion defect, mild obstruction, no BD response  Chart Review:     Specialty Problems      Pulmonary Problems   ILD (interstitial lung disease) ([facility]    [facility] # ILD with other  overlapping syndrome - +ANA SSA JO-1, ESR 55, CRP 73, normal CBC, neg RF, normal ACE & ANCA, ILD dx 2014, no biopsy, off of prednisone chronically - History with hand rash 03/2015 w/o ulceration or Raynauds, positive SICCA symptoms - Now back on prednisone 10 daily,  Tolerating Cellcept [id number] (decreased due to stomach irritation) Plan:  - Continue prednisone 10 daily x 3 months - Increase CellCept to [id number] - Increase activity, strengthening - Follow-up 4 weeks      Chronic respiratory failure with hypoxia (HCC)    Walk test on room air: 02/09/15: 98%, 92%, 91%, 90% at rest on room air. Pulse rate ranged 74-107.      Cor pulmonale, chronic (HCC)   Upper airway cough syndrome      Allergies  Allergen Reactions  . 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 Sertraline Hcl Hives  . Tape Other (See Comments)    Burns skin    Immunization History  Administered Date(s) Administered  . Influenza Split 12/25/2011, 09/27/2012  . Influenza,inj,Quad PF,6+ Mos 10/28/2013, 08/07/2014, 09/29/2016  . Influenza-Unspecified 08/20/2015  . PPD Test 05/18/2012, 09/10/2012, 03/27/2014, 02/12/2015, 03/03/2017  . Pneumococcal Polysaccharide-23 02/16/2015  . Td 03/20/2006    Past Medical History:  Diagnosis Date  . Anxiety   . Arthritis    rheumatoid arthritis- mild, no rheumatology care   . Asthma    seasonal allergies   . Asymptomatic LV dysfunction    a. Echo in Dec 2011 with EF 35 to 40%. Felt to be due to paced rhythm. b.  EF 25-30% in 07/2014.  . Bipolar affective disorder ([place])   . Cardiac pacemaker    a. Since age 51 in 43. b. Upgrade to BiV in 2013.  [name] Carpal tunnel syndrome of right wrist   . CHF (congestive heart failure) ([place])   . Congenital complete AV block   . Cor pulmonale, chronic ([place]) 04/08/2018  . Depression    bipolar  . Diabetes mellitus without complication ([place])   . GERD (gastroesophageal  reflux disease)   . Hypertension   . Interstitial lung disease ([place])   . Lupus ([place])   . Lupus (systemic lupus erythematosus) ([place])   . Obesity   . Pneumonitis    a. a/w hypoxia - inflammatory - large workup 07/2014.  . Presence of permanent cardiac pacemaker   . Seizures ([place])    as a child- from high fever  . Sinus tachycardia     Tobacco History: Social History   Tobacco Use  Smoking Status Former Smoker  . Packs/day: 0.25  . Years: 0.50  . Pack years: 0.12  . Types: Cigarettes  . Last attempt to quit: 07/25/1996  . Years since quitting: 21.9  Smokeless Tobacco Never Used   Counseling given: Not Answered   Outpatient Encounter Medications as of 07/02/2018  Medication Sig  . Azelastine-Fluticasone 137-50 MCG/ACT SUSP 1 spray per nostril twice a day  . azithromycin (ZITHROMAX) 250 MG tablet Take 1 tablet (250 mg total) by mouth daily. Take first 2 tablets together, then 1 every day until finished.  . benzonatate (TESSALON) 200 MG capsule Take 1 capsule (200 mg total) by mouth 3 (three) times daily as needed for cough.  . calcium-vitamin D (OSCAL WITH D) 500-200 MG-UNIT TABS tablet Take 1 tablet by mouth 2 (two) times daily with a meal.  . dicyclomine (BENTYL) 20 MG tablet Take 20 mg by mouth every 6 (six) hours.  [name] EASY TOUCH PEN NEEDLES 31G X 8 MM MISC USE AS DIRECTED  . Etonogestrel (NEXPLANON [place]) Inject 1 application into the skin once.   . furosemide (LASIX) 20 MG tablet TAKE 1 TABLET BY MOUTH EVERY DAY  . LANTUS SOLOSTAR 100 UNIT/ML Solostar Pen INJECT 15 UNITS SUBCUTANEOUSLY EVERY DAY  . metFORMIN (GLUCOPHAGE) 1000 MG tablet Take 1 tablet (1,000 mg total) by mouth 2 (two) times daily with a meal.  . metoprolol succinate (TOPROL XL) 25 MG 24 hr tablet Take 1 tablet (25 mg total) by mouth 2 (two) times daily.  . mycophenolate (CELLCEPT) 500 MG tablet Take 1,000 mg by mouth 2 (two) times daily.   [name] NOVOLOG FLEXPEN 100 UNIT/ML FlexPen INJECT 8 UNITS INTO THE SKIN 3  (THREE) TIMES DAILY WITH MEALS.  [name] OXYGEN Inhale 4 L into the lungs.  . potassium chloride SA (K-DUR,KLOR-CON) 20 MEQ tablet TAKE 2 TABLETS BY MOUTH ONCE DAILY  . predniSONE (DELTASONE) 10 MG tablet Take 3 tabs (30 mg total) for 7 days, then 2 tabs ( 20 mg total) for 7 days, then 1 tab (10 mg total) for 7 days.  [name] sulfamethoxazole-trimethoprim (BACTRIM DS,SEPTRA DS) 800-160 MG tablet Take 1 tablet by mouth 3 (three) times a week. Monday, Wednesday and Friday  . tacrolimus (PROGRAF) 0.5 MG capsule Take 1.5 mg by mouth 2 (two) times daily.  . traZODone (DESYREL) 50 MG tablet Take 1 tablet (50 mg total) by mouth at bedtime as needed for sleep.   No facility-administered encounter medications on file as of 07/02/2018.      Review of Systems  Review of Systems   Physical Exam  There were no vitals taken for this visit.  Wt Readings from Last 5 Encounters:  06/18/18 134 lb 6.4 oz (61 kg)  05/25/18 139 lb 3.2 oz (63.1 kg)  05/10/18 141 lb (64 kg)  04/12/18 141 lb (64 kg)  04/09/18 138 lb 7.2 oz (62.8 kg)     Physical Exam    Lab Results:  CBC    Component Value Date/Time   WBC 6.0 06/18/2018 1018   RBC 4.26 06/18/2018 1018   HGB 10.7 (L) 06/18/2018 1018   HGB 11.8 07/15/2017 1522   HCT 33.7 (L) 06/18/2018 1018   HCT 35.4 07/15/2017 1522   PLT 359.0 06/18/2018 1018   PLT 369 07/15/2017 1522   MCV 79.2 06/18/2018 1018   MCV 77 (L) 07/15/2017 1522   MCH 24.6 (L) 06/12/2018 1442   MCHC 31.7 06/18/2018 1018   RDW 16.4 (H) 06/18/2018 1018   RDW 16.1 (H) 07/15/2017 1522   LYMPHSABS 1.4 06/18/2018 1018   LYMPHSABS 1.3 07/15/2017 1522   MONOABS 0.5 06/18/2018 1018   EOSABS 0.3 06/18/2018 1018   EOSABS 0.1 07/15/2017 1522   BASOSABS 0.1 06/18/2018 1018   BASOSABS 0.0 07/15/2017 1522    BMET    Component Value Date/Time   NA 139 06/12/2018 1442   NA 138 07/15/2017 1522   K 4.1 06/12/2018 1442   CL 103 06/12/2018 1442   CO2 24 06/12/2018 1442   GLUCOSE 134 (H)  06/12/2018 1442   BUN 14 06/12/2018 1442   BUN 9 07/15/2017 1522   CREATININE 0.92 06/12/2018 1442   CREATININE 0.42 (L) 11/11/2016 1056   CALCIUM 9.2 06/12/2018 1442   GFRNONAA >60 06/12/2018 1442   GFRNONAA >89 11/11/2016 1056   GFRAA >60 06/12/2018 1442   GFRAA >89 11/11/2016 1056    BNP    Component Value Date/Time   BNP 15.7 04/07/2018 2334    ProBNP    Component Value Date/Time   PROBNP 11.0 07/06/2014 1431    Imaging: Dg Chest 2 View  Result Date: 06/12/2018 CLINICAL DATA:  Productive cough for 10 days. EXAM: CHEST - 2 VIEW COMPARISON:  Chest CT 04/08/2018 and chest radiograph 02/23/2018 FINDINGS: Diffuse parenchymal lung densities are unchanged from the most recent comparison imaging. Heart size is within normal limits and stable. Again noted is a biventricular cardiac pacemaker. No large pleural effusions. Bony structures are unremarkable. Negative for a pneumothorax. IMPRESSION: Chronic parenchymal lung densities bilaterally. No significant change from the recent comparison imaging. Stable appearance of the cardiac pacemaker. Electronically Signed   By: Markus Daft M.D.   On: 06/12/2018 15:12      Assessment & Plan:     No problem-specific Assessment & Plan notes found for this encounter.     Lauraine Rinne, NP 07/02/2018

## 2018-07-08 ENCOUNTER — Ambulatory Visit: Payer: Medicare Other | Admitting: Family Medicine

## 2018-07-08 ENCOUNTER — Ambulatory Visit: Payer: Medicare Other | Admitting: Primary Care

## 2018-07-08 NOTE — Progress Notes (Deleted)
   Subjective   Patient ID: Makayla Vasquez    DOB: 08-26-1981, 37 y.o. female   MRN: [id number]  CC: "***"  HPI: Makayla Vasquez is a 37 y.o. female who presents to clinic today for the following:  ***: ***  ***Last seen by me in July for community-acquired pneumonia follow-up.  Seen in ED in August for dry cough, posttussive emesis, nonbilious emesis, and hypotension.  She was using increased oxygen levels at home from 2-4L.  There was concern for pneumonia.  It is a throat and instructed to follow-up.  Subsequently followed up with [facility] pulmonology.  ROS: see HPI for pertinent.  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  Surgical history pacemaker and ablation, cholecystectomy, C-section, nasal, thyroglossal ductal cyst.  Family history heart disease, lupus, HTN. Smoking status reviewed. Medications reviewed.  Objective   There were no vitals taken for this visit. Vitals and nursing note reviewed.  General: well nourished, well developed, NAD with non-toxic appearance HEENT: normocephalic, atraumatic, moist mucous membranes Neck: supple, non-tender without lymphadenopathy Cardiovascular: regular rate and rhythm without murmurs, rubs, or gallops Lungs: clear to auscultation bilaterally with normal work of breathing Abdomen: soft, non-tender, non-distended, normoactive bowel sounds Skin: warm, dry, no rashes or lesions, cap refill < 2 seconds Extremities: warm and well perfused, normal tone, no edema  Assessment & Plan   No problem-specific Assessment & Plan notes found for this encounter.  No orders of the defined types were placed in this encounter.  No orders of the defined types were placed in this encounter.   Durward Parcel, DO [facility], PGY-3 07/08/2018, 8:17 AM

## 2018-07-12 ENCOUNTER — Ambulatory Visit: Payer: Medicare Other | Admitting: Family Medicine

## 2018-07-12 NOTE — Progress Notes (Deleted)
   Subjective   Patient ID: Makayla Vasquez    DOB: 09/23/1981, 37 y.o. female   MRN: [id number]  CC: "***"  HPI: Makayla Vasquez is a 37 y.o. female who presents to clinic today for the following:  ***: ***  ***Last seen by me in July for community-acquired pneumonia follow-up.  Seen in ED in August for dry cough, posttussive emesis, nonbilious emesis, and hypotension.  She was using increased oxygen levels at home from 2-4L.  There was concern for pneumonia.  It is a throat and instructed to follow-up.  Subsequently followed up with [facility] pulmonology.  ROS: see HPI for pertinent.  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  Surgical history pacemaker and ablation, cholecystectomy, C-section, nasal, thyroglossal ductal cyst.  Family history heart disease, lupus, HTN. Smoking status reviewed. Medications reviewed.  Objective   There were no vitals taken for this visit. Vitals and nursing note reviewed.  General: well nourished, well developed, NAD with non-toxic appearance HEENT: normocephalic, atraumatic, moist mucous membranes Neck: supple, non-tender without lymphadenopathy Cardiovascular: regular rate and rhythm without murmurs, rubs, or gallops Lungs: clear to auscultation bilaterally with normal work of breathing Abdomen: soft, non-tender, non-distended, normoactive bowel sounds Skin: warm, dry, no rashes or lesions, cap refill < 2 seconds Extremities: warm and well perfused, normal tone, no edema  Assessment & Plan   No problem-specific Assessment & Plan notes found for this encounter.  No orders of the defined types were placed in this encounter.  No orders of the defined types were placed in this encounter.   Durward Parcel, DO [facility], PGY-3 07/12/2018, 10:18 AM

## 2018-07-13 ENCOUNTER — Ambulatory Visit: Payer: Medicare Other | Admitting: Primary Care

## 2018-07-13 NOTE — Progress Notes (Deleted)
@Patient  ID: , female    DOB: 1981/03/16, 37 y.o.   MRN: [id number]  No chief complaint on file.   Referring provider: [id number], DO  HPI: 37 year old female, former smoker. 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 Patient of Dr. 20. Recent ED visit for cough  ED course: Patient presented to ED on 08/24 with dry cough x 2-3 weeks. Associated posttussive emesis. Occasional shortness of breath and rib pain. Using increased oxygen, baseline is 2-4L [place]. VSS. Trop negative. CXR with no obvious focal process. No clinical concern for DVT/PE. Covered with azithromycin and follow up with PCP.   06/18/2018 Patient presents today for acute visit, complains of cough for 3 weeks. Described as coughing fits, typically dry but occasionally she will bring up yellow mucus. Associated nasal drainage. She has not taken Flonase, claritin or cough medication. Gets heartburn symptoms once a month if she eats something spicy. Takes bentyl three times a day. Using 4L oxygen, which is top of her baseline. Takes 7.5mg  prednisone daily per rheumatology. Reports that she's lost weight. No leg swelling. Taking lasix 40mg  daily, increased 1 month ago by cardiologist Dr. 06/20/2018. Has a follow up in September. She was started on omeprazole twice daily, WBC normal. FENO 11. Checking sputum cultures.    07/13/2018 Patient presents today for follow-up visit. She has cancelled this apt twice on 09/13 and 09/18.      Testing reviewed: CXR 08/24- no focal consolidation, pleural effusion. Diffuse parenchymal lung densities are unchanged from the most recent comparison     Allergies  Allergen Reactions  . 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 Sertraline Hcl Hives  . Tape Other (See Comments)    Burns skin    Immunization History  Administered Date(s)  Administered  . Influenza Split 12/25/2011, 09/27/2012  . Influenza,inj,Quad PF,6+ Mos 10/28/2013, 08/07/2014, 09/29/2016  . Influenza-Unspecified 08/20/2015  . PPD Test 05/18/2012, 09/10/2012, 03/27/2014, 02/12/2015, 03/03/2017  . Pneumococcal Polysaccharide-23 02/16/2015  . Td 03/20/2006    Past Medical History:  Diagnosis Date  . Anxiety   . Arthritis    rheumatoid arthritis- mild, no rheumatology care   . Asthma    seasonal allergies   . Asymptomatic LV dysfunction    a. Echo in Dec 2011 with EF 35 to 40%. Felt to be due to paced rhythm. b. EF 25-30% in 07/2014.  . Bipolar affective disorder (HCC)   . Cardiac pacemaker    a. Since age 1 in 75. b. Upgrade to BiV in 2013.  4 Carpal tunnel syndrome of right wrist   . CHF (congestive heart failure) (HCC)   . Congenital complete AV block   . Cor pulmonale, chronic (HCC) 04/08/2018  . Depression    bipolar  . Diabetes mellitus without complication (HCC)   . GERD (gastroesophageal reflux disease)   . Hypertension   . Interstitial lung disease (HCC)   . Lupus (HCC)   . Lupus (systemic lupus erythematosus) (HCC)   . Obesity   . Pneumonitis    a. a/w hypoxia - inflammatory - large workup 07/2014.  . Presence of permanent cardiac pacemaker   . Seizures (HCC)    as a child- from high fever  . Sinus tachycardia     Tobacco History: Social History   Tobacco Use  Smoking Status Former Smoker  . Packs/day: 0.25  . Years: 0.50  .  Pack years: 0.12  . Types: Cigarettes  . Last attempt to quit: 07/25/1996  . Years since quitting: 21.9  Smokeless Tobacco Never Used   Counseling given: Not Answered   Outpatient Medications Prior to Visit  Medication Sig Dispense Refill  . Azelastine-Fluticasone 137-50 MCG/ACT SUSP 1 spray per nostril twice a day 1 Bottle 1  . azithromycin (ZITHROMAX) 250 MG tablet Take 1 tablet (250 mg total) by mouth daily. Take first 2 tablets together, then 1 every day until finished. 6 tablet 0  .  benzonatate (TESSALON) 200 MG capsule Take 1 capsule (200 mg total) by mouth 3 (three) times daily as needed for cough. 30 capsule 1  . calcium-vitamin D (OSCAL WITH D) 500-200 MG-UNIT TABS tablet Take 1 tablet by mouth 2 (two) times daily with a meal.  11  . dicyclomine (BENTYL) 20 MG tablet Take 20 mg by mouth every 6 (six) hours.    [name] EASY TOUCH PEN NEEDLES 31G X 8 MM MISC USE AS DIRECTED 100 each 11  . Etonogestrel (NEXPLANON [place]) Inject 1 application into the skin once.     . furosemide (LASIX) 20 MG tablet TAKE 1 TABLET BY MOUTH EVERY DAY 90 tablet 3  . LANTUS SOLOSTAR 100 UNIT/ML Solostar Pen INJECT 15 UNITS SUBCUTANEOUSLY EVERY DAY 15 pen 2  . metFORMIN (GLUCOPHAGE) 1000 MG tablet Take 1 tablet (1,000 mg total) by mouth 2 (two) times daily with a meal. 90 tablet 3  . metoprolol succinate (TOPROL XL) 25 MG 24 hr tablet Take 1 tablet (25 mg total) by mouth 2 (two) times daily. 60 tablet 11  . mycophenolate (CELLCEPT) 500 MG tablet Take 1,000 mg by mouth 2 (two) times daily.     [name] NOVOLOG FLEXPEN 100 UNIT/ML FlexPen INJECT 8 UNITS INTO THE SKIN 3 (THREE) TIMES DAILY WITH MEALS. 15 pen 5  . OXYGEN Inhale 4 L into the lungs.    . potassium chloride SA (K-DUR,KLOR-CON) 20 MEQ tablet TAKE 2 TABLETS BY MOUTH ONCE DAILY 60 tablet 3  . predniSONE (DELTASONE) 10 MG tablet Take 3 tabs (30 mg total) for 7 days, then 2 tabs ( 20 mg total) for 7 days, then 1 tab (10 mg total) for 7 days. 42 tablet 0  . sulfamethoxazole-trimethoprim (BACTRIM DS,SEPTRA DS) 800-160 MG tablet Take 1 tablet by mouth 3 (three) times a week. Monday, Wednesday and Friday    . tacrolimus (PROGRAF) 0.5 MG capsule Take 1.5 mg by mouth 2 (two) times daily.    . traZODone (DESYREL) 50 MG tablet Take 1 tablet (50 mg total) by mouth at bedtime as needed for sleep. 30 tablet 3   No facility-administered medications prior to visit.       Review of Systems  Review of Systems   Physical Exam  There were no vitals taken for this  visit. Physical Exam   Lab Results:  CBC    Component Value Date/Time   WBC 6.0 06/18/2018 1018   RBC 4.26 06/18/2018 1018   HGB 10.7 (L) 06/18/2018 1018   HGB 11.8 07/15/2017 1522   HCT 33.7 (L) 06/18/2018 1018   HCT 35.4 07/15/2017 1522   PLT 359.0 06/18/2018 1018   PLT 369 07/15/2017 1522   MCV 79.2 06/18/2018 1018   MCV 77 (L) 07/15/2017 1522   MCH 24.6 (L) 06/12/2018 1442   MCHC 31.7 06/18/2018 1018   RDW 16.4 (H) 06/18/2018 1018   RDW 16.1 (H) 07/15/2017 1522   LYMPHSABS 1.4 06/18/2018 1018   LYMPHSABS  1.3 07/15/2017 1522   MONOABS 0.5 06/18/2018 1018   EOSABS 0.3 06/18/2018 1018   EOSABS 0.1 07/15/2017 1522   BASOSABS 0.1 06/18/2018 1018   BASOSABS 0.0 07/15/2017 1522    BMET    Component Value Date/Time   NA 139 06/12/2018 1442   NA 138 07/15/2017 1522   K 4.1 06/12/2018 1442   CL 103 06/12/2018 1442   CO2 24 06/12/2018 1442   GLUCOSE 134 (H) 06/12/2018 1442   BUN 14 06/12/2018 1442   BUN 9 07/15/2017 1522   CREATININE 0.92 06/12/2018 1442   CREATININE 0.42 (L) 11/11/2016 1056   CALCIUM 9.2 06/12/2018 1442   GFRNONAA >60 06/12/2018 1442   GFRNONAA >89 11/11/2016 1056   GFRAA >60 06/12/2018 1442   GFRAA >89 11/11/2016 1056    BNP    Component Value Date/Time   BNP 15.7 04/07/2018 2334    ProBNP    Component Value Date/Time   PROBNP 11.0 07/06/2014 1431    Imaging: No results found.   Assessment & Plan:   No problem-specific Assessment & Plan notes found for this encounter.     Glenford Bayley, NP 07/13/2018

## 2018-07-14 ENCOUNTER — Emergency Department (HOSPITAL_COMMUNITY): Payer: Medicare Other

## 2018-07-14 ENCOUNTER — Inpatient Hospital Stay (HOSPITAL_COMMUNITY)
Admission: EM | Admit: 2018-07-14 | Discharge: 2018-07-17 | DRG: 871 | Disposition: A | Discharge status: Home / Self Care | Payer: Medicare Other | Attending: Family Medicine | Admitting: Family Medicine

## 2018-07-14 ENCOUNTER — Other Ambulatory Visit: Payer: Self-pay

## 2018-07-14 ENCOUNTER — Encounter (HOSPITAL_COMMUNITY): Payer: Self-pay | Admitting: Emergency Medicine

## 2018-07-14 DIAGNOSIS — Z9981 Dependence on supplemental oxygen: Secondary | ICD-10-CM

## 2018-07-14 DIAGNOSIS — J849 Interstitial pulmonary disease, unspecified: Secondary | ICD-10-CM

## 2018-07-14 DIAGNOSIS — D509 Iron deficiency anemia, unspecified: Secondary | ICD-10-CM | POA: Diagnosis present

## 2018-07-14 DIAGNOSIS — J9601 Acute respiratory failure with hypoxia: Secondary | ICD-10-CM

## 2018-07-14 DIAGNOSIS — K219 Gastro-esophageal reflux disease without esophagitis: Secondary | ICD-10-CM | POA: Diagnosis present

## 2018-07-14 DIAGNOSIS — I509 Heart failure, unspecified: Secondary | ICD-10-CM

## 2018-07-14 DIAGNOSIS — R000 Tachycardia, unspecified: Secondary | ICD-10-CM | POA: Diagnosis not present

## 2018-07-14 DIAGNOSIS — J189 Pneumonia, unspecified organism: Secondary | ICD-10-CM | POA: Diagnosis not present

## 2018-07-14 DIAGNOSIS — E538 Deficiency of other specified B group vitamins: Secondary | ICD-10-CM | POA: Diagnosis present

## 2018-07-14 DIAGNOSIS — Z888 Allergy status to other drugs, medicaments and biological substances status: Secondary | ICD-10-CM

## 2018-07-14 DIAGNOSIS — I5043 Acute on chronic combined systolic (congestive) and diastolic (congestive) heart failure: Secondary | ICD-10-CM | POA: Diagnosis not present

## 2018-07-14 DIAGNOSIS — M069 Rheumatoid arthritis, unspecified: Secondary | ICD-10-CM | POA: Diagnosis present

## 2018-07-14 DIAGNOSIS — I2781 Cor pulmonale (chronic): Secondary | ICD-10-CM | POA: Diagnosis not present

## 2018-07-14 DIAGNOSIS — J9611 Chronic respiratory failure with hypoxia: Secondary | ICD-10-CM | POA: Diagnosis present

## 2018-07-14 DIAGNOSIS — I2729 Other secondary pulmonary hypertension: Secondary | ICD-10-CM | POA: Diagnosis present

## 2018-07-14 DIAGNOSIS — Q246 Congenital heart block: Secondary | ICD-10-CM

## 2018-07-14 DIAGNOSIS — R0689 Other abnormalities of breathing: Secondary | ICD-10-CM | POA: Diagnosis not present

## 2018-07-14 DIAGNOSIS — Z794 Long term (current) use of insulin: Secondary | ICD-10-CM

## 2018-07-14 DIAGNOSIS — Z87891 Personal history of nicotine dependence: Secondary | ICD-10-CM

## 2018-07-14 DIAGNOSIS — E785 Hyperlipidemia, unspecified: Secondary | ICD-10-CM | POA: Diagnosis present

## 2018-07-14 DIAGNOSIS — D649 Anemia, unspecified: Secondary | ICD-10-CM | POA: Diagnosis not present

## 2018-07-14 DIAGNOSIS — Z950 Presence of cardiac pacemaker: Secondary | ICD-10-CM | POA: Diagnosis not present

## 2018-07-14 DIAGNOSIS — E1169 Type 2 diabetes mellitus with other specified complication: Secondary | ICD-10-CM | POA: Diagnosis present

## 2018-07-14 DIAGNOSIS — N390 Urinary tract infection, site not specified: Secondary | ICD-10-CM | POA: Diagnosis not present

## 2018-07-14 DIAGNOSIS — B961 Klebsiella pneumoniae [K. pneumoniae] as the cause of diseases classified elsewhere: Secondary | ICD-10-CM | POA: Diagnosis not present

## 2018-07-14 DIAGNOSIS — Z9114 Patient's other noncompliance with medication regimen: Secondary | ICD-10-CM

## 2018-07-14 DIAGNOSIS — R0602 Shortness of breath: Secondary | ICD-10-CM | POA: Diagnosis not present

## 2018-07-14 DIAGNOSIS — I4581 Long QT syndrome: Secondary | ICD-10-CM | POA: Diagnosis present

## 2018-07-14 DIAGNOSIS — Z23 Encounter for immunization: Secondary | ICD-10-CM

## 2018-07-14 DIAGNOSIS — A419 Sepsis, unspecified organism: Secondary | ICD-10-CM | POA: Diagnosis not present

## 2018-07-14 DIAGNOSIS — E2740 Unspecified adrenocortical insufficiency: Secondary | ICD-10-CM | POA: Diagnosis present

## 2018-07-14 DIAGNOSIS — I959 Hypotension, unspecified: Secondary | ICD-10-CM | POA: Diagnosis present

## 2018-07-14 DIAGNOSIS — R112 Nausea with vomiting, unspecified: Secondary | ICD-10-CM | POA: Diagnosis not present

## 2018-07-14 DIAGNOSIS — M329 Systemic lupus erythematosus, unspecified: Secondary | ICD-10-CM | POA: Diagnosis not present

## 2018-07-14 DIAGNOSIS — I340 Nonrheumatic mitral (valve) insufficiency: Secondary | ICD-10-CM | POA: Diagnosis not present

## 2018-07-14 DIAGNOSIS — D638 Anemia in other chronic diseases classified elsewhere: Secondary | ICD-10-CM | POA: Diagnosis present

## 2018-07-14 DIAGNOSIS — I420 Dilated cardiomyopathy: Secondary | ICD-10-CM | POA: Diagnosis not present

## 2018-07-14 DIAGNOSIS — R0902 Hypoxemia: Secondary | ICD-10-CM | POA: Diagnosis not present

## 2018-07-14 DIAGNOSIS — A4159 Other Gram-negative sepsis: Secondary | ICD-10-CM | POA: Diagnosis present

## 2018-07-14 DIAGNOSIS — Z79899 Other long term (current) drug therapy: Secondary | ICD-10-CM

## 2018-07-14 DIAGNOSIS — B9789 Other viral agents as the cause of diseases classified elsewhere: Secondary | ICD-10-CM | POA: Diagnosis present

## 2018-07-14 DIAGNOSIS — I110 Hypertensive heart disease with heart failure: Secondary | ICD-10-CM | POA: Diagnosis present

## 2018-07-14 DIAGNOSIS — A414 Sepsis due to anaerobes: Secondary | ICD-10-CM | POA: Diagnosis present

## 2018-07-14 DIAGNOSIS — R05 Cough: Secondary | ICD-10-CM | POA: Diagnosis not present

## 2018-07-14 LAB — URINALYSIS, ROUTINE W REFLEX MICROSCOPIC
Bilirubin Urine: NEGATIVE
GLUCOSE, UA: NEGATIVE mg/dL
Ketones, ur: 5 mg/dL — AB
LEUKOCYTES UA: NEGATIVE
Nitrite: NEGATIVE
Protein, ur: NEGATIVE mg/dL
SPECIFIC GRAVITY, URINE: 1.012 (ref 1.005–1.030)
pH: 5 (ref 5.0–8.0)

## 2018-07-14 LAB — INFLUENZA PANEL BY PCR (TYPE A & B)
INFLAPCR: NEGATIVE
Influenza B By PCR: NEGATIVE

## 2018-07-14 LAB — BRAIN NATRIURETIC PEPTIDE: B Natriuretic Peptide: 259.5 pg/mL — ABNORMAL HIGH (ref 0.0–100.0)

## 2018-07-14 LAB — CBC WITH DIFFERENTIAL/PLATELET
Abs Immature Granulocytes: 0.1 10*3/uL (ref 0.0–0.1)
Basophils Absolute: 0 10*3/uL (ref 0.0–0.1)
Basophils Relative: 0 %
EOS ABS: 0.1 10*3/uL (ref 0.0–0.7)
Eosinophils Relative: 1 %
HEMATOCRIT: 31 % — AB (ref 36.0–46.0)
HEMOGLOBIN: 9 g/dL — AB (ref 12.0–15.0)
Immature Granulocytes: 1 %
LYMPHS ABS: 0.8 10*3/uL (ref 0.7–4.0)
LYMPHS PCT: 9 %
MCH: 24.3 pg — AB (ref 26.0–34.0)
MCHC: 29 g/dL — ABNORMAL LOW (ref 30.0–36.0)
MCV: 83.8 fL (ref 78.0–100.0)
MONO ABS: 0.5 10*3/uL (ref 0.1–1.0)
Monocytes Relative: 5 %
Neutro Abs: 8.2 10*3/uL — ABNORMAL HIGH (ref 1.7–7.7)
Neutrophils Relative %: 84 %
Platelets: 370 10*3/uL (ref 150–400)
RBC: 3.7 MIL/uL — ABNORMAL LOW (ref 3.87–5.11)
RDW: 15.8 % — AB (ref 11.5–15.5)
WBC: 9.8 10*3/uL (ref 4.0–10.5)

## 2018-07-14 LAB — GLUCOSE, CAPILLARY: Glucose-Capillary: 127 mg/dL — ABNORMAL HIGH (ref 70–99)

## 2018-07-14 LAB — COMPREHENSIVE METABOLIC PANEL
ALK PHOS: 52 U/L (ref 38–126)
ALT: 19 U/L (ref 0–44)
AST: 35 U/L (ref 15–41)
Albumin: 2.7 g/dL — ABNORMAL LOW (ref 3.5–5.0)
Anion gap: 11 (ref 5–15)
BUN: 8 mg/dL (ref 6–20)
CALCIUM: 7.9 mg/dL — AB (ref 8.9–10.3)
CHLORIDE: 106 mmol/L (ref 98–111)
CO2: 23 mmol/L (ref 22–32)
CREATININE: 0.69 mg/dL (ref 0.44–1.00)
GFR calc Af Amer: >60 mL/min (ref >60)
Glucose, Bld: 140 mg/dL — ABNORMAL HIGH (ref 70–99)
Potassium: 3.5 mmol/L (ref 3.5–5.1)
Sodium: 140 mmol/L (ref 135–145)
Total Bilirubin: 0.9 mg/dL (ref 0.3–1.2)
Total Protein: 6.3 g/dL — ABNORMAL LOW (ref 6.5–8.1)

## 2018-07-14 LAB — PROTIME-INR
INR: 1.08
Prothrombin Time: 13.9 seconds (ref 11.4–15.2)

## 2018-07-14 LAB — I-STAT BETA HCG BLOOD, ED (MC, WL, AP ONLY)

## 2018-07-14 LAB — TROPONIN I
TROPONIN I: 0.03 ng/mL — AB (ref <0.03)
Troponin I: 0.04 ng/mL (ref <0.03)

## 2018-07-14 LAB — I-STAT CG4 LACTIC ACID, ED: Lactic Acid, Venous: 1.65 mmol/L (ref 0.5–1.9)

## 2018-07-14 MED ORDER — ACETAMINOPHEN 325 MG PO TABS: 650.0000 mg | ORAL_TABLET | Freq: Four times a day (QID) | ORAL | Status: DC | PRN

## 2018-07-14 MED ORDER — ENOXAPARIN SODIUM 40 MG/0.4ML ~~LOC~~ SOLN
40.0000 mg | SUBCUTANEOUS | Status: DC
  Administered 2018-07-14: 40 mg via SUBCUTANEOUS
  Filled 2018-07-14: qty 0.4

## 2018-07-14 MED ORDER — FUROSEMIDE 10 MG/ML IJ SOLN
20.0000 mg | Freq: Once | INTRAMUSCULAR | Status: AC
  Administered 2018-07-14: 20 mg via INTRAVENOUS
  Filled 2018-07-14: qty 2

## 2018-07-14 MED ORDER — INSULIN GLARGINE 100 UNIT/ML ~~LOC~~ SOLN
10.0000 [IU] | Freq: Every day | SUBCUTANEOUS | Status: DC
  Administered 2018-07-15 – 2018-07-17 (×3): 10 [IU] via SUBCUTANEOUS
  Filled 2018-07-14 (×4): qty 0.1

## 2018-07-14 MED ORDER — PIPERACILLIN-TAZOBACTAM 3.375 G IVPB 30 MIN
3.3750 g | Freq: Once | INTRAVENOUS | Status: AC
  Administered 2018-07-14: 3.375 g via INTRAVENOUS
  Filled 2018-07-14: qty 50

## 2018-07-14 MED ORDER — INSULIN ASPART 100 UNIT/ML ~~LOC~~ SOLN
0.0000 [IU] | Freq: Three times a day (TID) | SUBCUTANEOUS | Status: DC
  Administered 2018-07-15: 3 [IU] via SUBCUTANEOUS
  Administered 2018-07-16: 2 [IU] via SUBCUTANEOUS
  Administered 2018-07-17: 3 [IU] via SUBCUTANEOUS
  Administered 2018-07-17: 7 [IU] via SUBCUTANEOUS

## 2018-07-14 MED ORDER — DICYCLOMINE HCL 20 MG PO TABS
20.0000 mg | ORAL_TABLET | Freq: Four times a day (QID) | ORAL | Status: DC | PRN
  Administered 2018-07-14: 20 mg via ORAL
  Filled 2018-07-14 (×2): qty 1

## 2018-07-14 MED ORDER — ACETAMINOPHEN 325 MG PO TABS
650.0000 mg | ORAL_TABLET | Freq: Once | ORAL | Status: AC
  Administered 2018-07-14: 650 mg via ORAL
  Filled 2018-07-14: qty 2

## 2018-07-14 MED ORDER — INSULIN GLARGINE 100 UNIT/ML ~~LOC~~ SOLN: 10.0000 [IU] | Freq: Every day | SUBCUTANEOUS | Status: DC

## 2018-07-14 MED ORDER — ACETAMINOPHEN 650 MG RE SUPP: 650.0000 mg | Freq: Four times a day (QID) | RECTAL | Status: DC | PRN

## 2018-07-14 MED ORDER — PREDNISONE 5 MG PO TABS
7.5000 mg | ORAL_TABLET | Freq: Every day | ORAL | Status: DC
  Administered 2018-07-14 – 2018-07-15 (×2): 7.5 mg via ORAL
  Filled 2018-07-14 (×2): qty 1

## 2018-07-14 MED ORDER — FUROSEMIDE 10 MG/ML IJ SOLN: 20.0000 mg | Freq: Once | INTRAMUSCULAR | Status: DC

## 2018-07-14 MED ORDER — GUAIFENESIN-DM 100-10 MG/5ML PO SYRP
5.0000 mL | ORAL_SOLUTION | ORAL | Status: DC | PRN
  Administered 2018-07-14 – 2018-07-17 (×3): 5 mL via ORAL
  Filled 2018-07-14 (×3): qty 5

## 2018-07-14 MED ORDER — VANCOMYCIN HCL IN DEXTROSE 1-5 GM/200ML-% IV SOLN
1000.0000 mg | Freq: Once | INTRAVENOUS | Status: AC
  Administered 2018-07-14: 1000 mg via INTRAVENOUS
  Filled 2018-07-14: qty 200

## 2018-07-14 MED ORDER — PIPERACILLIN-TAZOBACTAM 3.375 G IVPB
3.3750 g | Freq: Three times a day (TID) | INTRAVENOUS | Status: DC
  Administered 2018-07-15 – 2018-07-17 (×7): 3.375 g via INTRAVENOUS
  Filled 2018-07-14 (×9): qty 50

## 2018-07-14 MED ORDER — SODIUM CHLORIDE 0.9 % IV BOLUS
250.0000 mL | Freq: Once | INTRAVENOUS | Status: AC
  Administered 2018-07-14: 250 mL via INTRAVENOUS

## 2018-07-14 MED ORDER — VANCOMYCIN HCL 500 MG IV SOLR
500.0000 mg | Freq: Three times a day (TID) | INTRAVENOUS | Status: DC
  Administered 2018-07-15 – 2018-07-16 (×4): 500 mg via INTRAVENOUS
  Filled 2018-07-14 (×5): qty 500

## 2018-07-14 NOTE — ED Notes (Signed)
ED Provider at bedside. 

## 2018-07-14 NOTE — Progress Notes (Signed)
Pharmacy Antibiotic Note  Makayla Vasquez is a 37 y.o. female admitted on 07/14/2018 with sepsis.  Pharmacy has been consulted for zosyn and vancomycin dosing. WBC 9.8, Tmax 101.4,  CrCl ~85.8  Plan: Vancomycin 1000mg  x1, then 500 Q8H Zosyn 3.375 Q8H F/u LOT/de-escalation, cultures, renal fxn   Height: 5\' 5"  (165.1 cm) Weight: 130 lb (59 kg) IBW/kg (Calculated) : 57  Temp (24hrs), Avg:101.4 F (38.6 C), Min:101.4 F (38.6 C), Max:101.4 F (38.6 C)  Recent Labs  Lab 07/14/18 1530 07/14/18 1549  WBC 9.8  --   CREATININE 0.69  --   LATICACIDVEN  --  1.65    Estimated Creatinine Clearance: 85.8 mL/min (by C-G formula based on SCr of 0.69 mg/dL).    Allergies  Allergen Reactions  . 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 Sertraline Hcl Hives  . Tape Other (See Comments)    Burns skin    Antimicrobials this admission: vanc 9/25 >>  zosyn 9/25 >>   Dose adjustments this admission:   Microbiology results:   Thank you for allowing pharmacy to be a part of this patient's care.  10/25, PharmD PGY1 Pharmacy Resident 07/14/2018    9:11 PM

## 2018-07-14 NOTE — ED Provider Notes (Signed)
[facility] EMERGENCY DEPARTMENT Provider Note   CSN: [id number] Arrival date & time: 07/14/18  1507     History   Chief Complaint Chief Complaint  Patient presents with  . Shortness of Breath    HPI Makayla Vasquez is a 37 y.o. female.  HPI Patient with history of chronic oxygen dependence on 2 to 4 L of supplemental oxygen via nasal cannula.  She presents with acute worsening of shortness of breath starting last night.  Associated with nonproductive cough and chills.  States she has chronic abdominal pain which is unchanged.  Admits to nausea.  No new lower extremity swelling or pain. Past Medical History:  Diagnosis Date  . Anxiety   . Arthritis    rheumatoid arthritis- mild, no rheumatology care   . Asthma    seasonal allergies   . Asymptomatic LV dysfunction    a. Echo in Dec 2011 with EF 35 to 40%. Felt to be due to paced rhythm. b. EF 25-30% in 07/2014.  . Bipolar affective disorder (HCC)   . Cardiac pacemaker    a. Since age 37 in 33. b. Upgrade to BiV in 2013.  [name] Carpal tunnel syndrome of right wrist   . CHF (congestive heart failure) (HCC)   . Congenital complete AV block   . Cor pulmonale, chronic (HCC) 04/08/2018  . Depression    bipolar  . Diabetes mellitus without complication (HCC)   . GERD (gastroesophageal reflux disease)   . Hypertension   . Interstitial lung disease (HCC)   . Lupus (HCC)   . Lupus (systemic lupus erythematosus) (HCC)   . Obesity   . Pneumonitis    a. a/w hypoxia - inflammatory - large workup 07/2014.  . Presence of permanent cardiac pacemaker   . Seizures (HCC)    as a child- from high fever  . Sinus tachycardia     Patient Active Problem List   Diagnosis Date Noted  . UTI due to Klebsiella species   . Sepsis (HCC) 07/14/2018  . Upper airway cough syndrome 06/18/2018  . Cor pulmonale, chronic (HCC) 04/08/2018  . Abnormal uterine bleeding 02/14/2018  . Dyspareunia, female 05/14/2017  . 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 On Cellcept therapy   . Systemic lupus erythematosus (HCC)   . Long term (current) use of systemic steroids   . Chronic diastolic congestive heart failure (HCC)   . Interstitial lung disease (HCC)   . Anemia   . Bipolar affective disorder in remission (HCC)   . Acute on chronic congestive heart failure (HCC)   . 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 Adrenal insufficiency (HCC)   . Antisynthetase syndrome (HCC)   . Idiopathic hypotension 07/08/2015  . Hyperlipidemia associated with type 2 diabetes mellitus (HCC) 05/04/2015  . Diabetes mellitus type 2 in obese (HCC)   . Chronic respiratory failure with hypoxia (HCC) 08/07/2014  . Acute respiratory failure with hypoxia (HCC) 06/19/2014  . ILD (interstitial lung disease) (HCC) 06/19/2014  . Congestive dilated cardiomyopathy (HCC) 05/31/2014  . Chronic systolic heart failure (HCC) 02/26/2012  . PPM-Medtronic   . Congenital complete AV block   . GERD (gastroesophageal reflux disease)   . 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 Polycystic ovaries 12/17/2006    Past Surgical History:  Procedure Laterality Date  . ATRIAL TACH ABLATION N/A 08/14/2014   Procedure: ATRIAL TACH ABLATION;  Surgeon: Marinus Maw, MD;  Location: [facility] CATH LAB;  Service: Cardiovascular;  Laterality: N/A;  .  BI-VENTRICULAR PACEMAKER UPGRADE N/A 03/08/2012   Procedure: BI-VENTRICULAR PACEMAKER UPGRADE;  Surgeon: Marinus Maw, MD;  Location: [facility] CATH LAB;  Service: Cardiovascular;  Laterality: N/A;  . CARPAL TUNNEL WITH CUBITAL TUNNEL Right 07/26/2013   Procedure: RIGHT LIMITED OPEN CARPAL TUNNEL RELEASE ,  RIGHT CUBITAL TUNNEL RELEASE, INSITU VERSES ULNAR NERVE DECOMPRESSION AND ANTERIOR TRANSPOSITION;  Surgeon: Dominica Severin, MD;  Location: [facility] OR;  Service: Orthopedics;  Laterality: Right;  . CESAREAN SECTION    . CHOLECYSTECTOMY    . CYST EXCISION  12/10/2012   THYROID  . INSERT / REPLACE / REMOVE PACEMAKER      2001  . IUD REMOVAL  11/03/2011   Procedure: INTRAUTERINE DEVICE (IUD) REMOVAL;  Surgeon: Myra C. Marice Potter, MD;  Location: [facility] ORS;  Service: Gynecology;  Laterality: N/A;  . NASAL FRACTURE SURGERY     /w plate   . RIGHT HEART CATHETERIZATION N/A 10/26/2014   Procedure: RIGHT HEART CATH;  Surgeon: Dolores Patty, MD;  Location: [facility] CATH LAB;  Service: Cardiovascular;  Laterality: N/A;  . THROAT SURGERY  1994   s/p laser treatment  . THYROGLOSSAL DUCT CYST N/A 12/10/2012   Procedure: REVISION OF THYROGLOSSAL DUCT CYST EXCISION;  Surgeon: Serena Colonel, MD;  Location: [facility] OR;  Service: ENT;  Laterality: N/A;  Revision of Thyroglossal Duct Cyst Excision  . VIDEO BRONCHOSCOPY Bilateral 06/19/2014   Procedure: VIDEO BRONCHOSCOPY WITHOUT FLUORO;  Surgeon: Kalman Shan, MD;  Location: [facility] ENDOSCOPY;  Service: Cardiopulmonary;  Laterality: Bilateral;     OB History    Gravida  1   Para  1   Term  1   Preterm      AB      Living  1     SAB      TAB      Ectopic      Multiple      Live Births               Home Medications    Prior to Admission medications   Medication Sig Start Date End Date Taking? Authorizing Provider  dicyclomine (BENTYL) 20 MG tablet Take 20 mg by mouth every 6 (six) hours.   Yes [provider]  Etonogestrel (NEXPLANON [place]) Inject 1 application into the skin once.    Yes [provider]  furosemide (LASIX) 20 MG tablet TAKE 1 TABLET BY MOUTH EVERY DAY Patient taking differently: Take 20 mg by mouth daily.  05/03/18  Yes Wendee Beavers, DO  LANTUS SOLOSTAR 100 UNIT/ML Solostar Pen INJECT 15 UNITS SUBCUTANEOUSLY EVERY DAY Patient taking differently: Inject 15 Units into the skin daily.  04/26/18  Yes Wendee Beavers, DO  metFORMIN (GLUCOPHAGE) 1000 MG tablet Take 1 tablet (1,000 mg total) by mouth 2 (two) times daily with a meal. 02/01/18  Yes Marquette Saa, MD  metoprolol succinate (TOPROL XL) 25 MG 24 hr tablet Take 1 tablet  (25 mg total) by mouth 2 (two) times daily. 05/25/18 05/25/19 Yes Laurey Morale, MD  mycophenolate (CELLCEPT) 500 MG tablet Take 1,000 mg by mouth 2 (two) times daily.    Yes [provider]  NOVOLOG FLEXPEN 100 UNIT/ML FlexPen INJECT 8 UNITS INTO THE SKIN 3 (THREE) TIMES DAILY WITH MEALS. Patient taking differently: Inject 8 Units into the skin 3 (three) times daily with meals.  09/30/17  Yes Marquette Saa, MD  OXYGEN Inhale 4 L into the lungs.   Yes [provider]  potassium chloride SA (K-DUR,KLOR-CON)  20 MEQ tablet TAKE 2 TABLETS BY MOUTH ONCE DAILY Patient taking differently: Take 40 mEq by mouth daily.  05/27/18  Yes Laurey Morale, MD  predniSONE (DELTASONE) 10 MG tablet Take 3 tabs (30 mg total) for 7 days, then 2 tabs ( 20 mg total) for 7 days, then 1 tab (10 mg total) for 7 days. Patient taking differently: Take 7.5 mg by mouth daily.  04/14/18  Yes Marquette Saa, MD  sulfamethoxazole-trimethoprim (BACTRIM DS,SEPTRA DS) 800-160 MG tablet Take 1 tablet by mouth every other day.    Yes [provider]  tacrolimus (PROGRAF) 0.5 MG capsule Take 1.5 mg by mouth 2 (two) times daily. 11/27/17  Yes [provider]  Azelastine-Fluticasone 137-50 MCG/ACT SUSP 1 spray per nostril twice a day Patient not taking: Reported on 07/14/2018 06/18/18   Glenford Bayley, NP  benzonatate (TESSALON) 200 MG capsule Take 1 capsule (200 mg total) by mouth 3 (three) times daily as needed for cough. Patient not taking: Reported on 07/14/2018 06/18/18   Glenford Bayley, NP  EASY TOUCH PEN NEEDLES 31G X 8 MM MISC USE AS DIRECTED 08/14/17   Marquette Saa, MD  traZODone (DESYREL) 50 MG tablet Take 1 tablet (50 mg total) by mouth at bedtime as needed for sleep. Patient not taking: Reported on 07/14/2018 04/12/18   Marquette Saa, MD    Family History Family History  Problem Relation Age of Onset  . Heart disease Mother        CHF (no  details)  . Hypertension Mother   . Lupus Mother   . Heart disease Father        Murmur  . Heart disease Sister 86        No details.  History of a pacemaker    Social History Social History   Tobacco Use  . Smoking status: Former Smoker    Packs/day: 0.25    Years: 0.50    Pack years: 0.12    Types: Cigarettes    Last attempt to quit: 07/25/1996    Years since quitting: 21.9  . Smokeless tobacco: Never Used  Substance Use Topics  . Alcohol use: No  . Drug use: No     Allergies   Atorvastatin; Sertraline hcl; and Tape   Review of Systems Review of Systems  Constitutional: Positive for chills, fatigue and fever.  HENT: Negative for sore throat.   Eyes: Negative for visual disturbance.  Respiratory: Positive for cough and shortness of breath.   Cardiovascular: Negative for chest pain.  Gastrointestinal: Positive for abdominal pain and nausea. Negative for constipation, diarrhea and vomiting.  Genitourinary: Negative for dysuria, flank pain and frequency.  Musculoskeletal: Negative for back pain, myalgias and neck pain.  Skin: Negative for rash and wound.  Neurological: Negative for dizziness, weakness, light-headedness, numbness and headaches.  All other systems reviewed and are negative.    Physical Exam Updated Vital Signs BP 139/74 (BP Location: Right Arm)   Pulse 61   Temp 98 F (36.7 C) (Oral)   Resp 18   Ht 5\' 5"  (1.651 m)   Wt 63.8 kg   SpO2 96%   BMI 23.41 kg/m   Physical Exam  Constitutional: She is oriented to person, place, and time. She appears well-developed and well-nourished. No distress.  Chronically ill-appearing  HENT:  Head: Normocephalic and atraumatic.  Mouth/Throat: Oropharynx is clear and moist. No oropharyngeal exudate.  Eyes: Pupils are equal, round, and reactive to light. EOM are normal.  Neck: Normal range of motion. Neck supple.  Cardiovascular: Regular rhythm.  Tachycardia  Pulmonary/Chest:  Tachypnea.  Mildly increased  respiratory effort.  Poor air movement.  Diminished breath sounds bilateral bases.  Abdominal: Soft. Bowel sounds are normal. There is tenderness. There is no rebound and no guarding.  Musculoskeletal: Normal range of motion. She exhibits no edema or tenderness.  No lower extremity swelling, asymmetry or tenderness.  2+ pulses all extremities.  Neurological: She is alert and oriented to person, place, and time.  Moves all extremity's without focal deficit.  Sensation fully intact.  Skin: Skin is warm and dry. Capillary refill takes less than 2 seconds. No rash noted. She is not diaphoretic. No erythema.  Psychiatric: She has a normal mood and affect. Her behavior is normal.  Nursing note and vitals reviewed.    ED Treatments / Results  Labs (all labs ordered are listed, but only abnormal results are displayed) Labs Reviewed  URINE CULTURE - Abnormal; Notable for the following components:      Result Value   Culture   (*)    Value: >=100,000 COLONIES/mL KLEBSIELLA PNEUMONIAE SUSCEPTIBILITIES TO FOLLOW Performed at [facility], [address], [place], [place] [address]    All other components within normal limits  RESPIRATORY PANEL BY PCR - Abnormal; Notable for the following components:   Rhinovirus / Enterovirus DETECTED (*)    All other components within normal limits  COMPREHENSIVE METABOLIC PANEL - Abnormal; Notable for the following components:   Glucose, Bld 140 (*)    Calcium 7.9 (*)    Total Protein 6.3 (*)    Albumin 2.7 (*)    All other components within normal limits  CBC WITH DIFFERENTIAL/PLATELET - Abnormal; Notable for the following components:   RBC 3.70 (*)    Hemoglobin 9.0 (*)    HCT 31.0 (*)    MCH 24.3 (*)    MCHC 29.0 (*)    RDW 15.8 (*)    Neutro Abs 8.2 (*)    All other components within normal limits  URINALYSIS, ROUTINE W REFLEX MICROSCOPIC - Abnormal; Notable for the following components:   Hgb urine dipstick LARGE (*)    Ketones, ur 5 (*)     RBC / HPF >50 (*)    Bacteria, UA MANY (*)    All other components within normal limits  BRAIN NATRIURETIC PEPTIDE - Abnormal; Notable for the following components:   B Natriuretic Peptide 259.5 (*)    All other components within normal limits  TROPONIN I - Abnormal; Notable for the following components:   Troponin I 0.03 (*)    All other components within normal limits  VITAMIN B12 - Abnormal; Notable for the following components:   Vitamin B-12 116 (*)    All other components within normal limits  IRON AND TIBC - Abnormal; Notable for the following components:   Iron 13 (*)    TIBC 224 (*)    Saturation Ratios 6 (*)    All other components within normal limits  RETICULOCYTES - Abnormal; Notable for the following components:   RBC. 3.44 (*)    All other components within normal limits  BASIC METABOLIC PANEL - Abnormal; Notable for the following components:   Potassium 3.3 (*)    Glucose, Bld 149 (*)    Calcium 7.7 (*)    All other components within normal limits  CBC - Abnormal; Notable for the following components:   RBC 3.44 (*)    Hemoglobin 8.4 (*)  HCT 28.2 (*)    MCH 24.4 (*)    MCHC 29.8 (*)    RDW 15.7 (*)    All other components within normal limits  TROPONIN I - Abnormal; Notable for the following components:   Troponin I 0.04 (*)    All other components within normal limits  TROPONIN I - Abnormal; Notable for the following components:   Troponin I 0.04 (*)    All other components within normal limits  GLUCOSE, CAPILLARY - Abnormal; Notable for the following components:   Glucose-Capillary 127 (*)    All other components within normal limits  GLUCOSE, CAPILLARY - Abnormal; Notable for the following components:   Glucose-Capillary 139 (*)    All other components within normal limits  GLUCOSE, CAPILLARY - Abnormal; Notable for the following components:   Glucose-Capillary 218 (*)    All other components within normal limits  CBC - Abnormal; Notable for  the following components:   RBC 3.35 (*)    Hemoglobin 8.1 (*)    HCT 27.4 (*)    MCH 24.2 (*)    MCHC 29.6 (*)    All other components within normal limits  BASIC METABOLIC PANEL - Abnormal; Notable for the following components:   Glucose, Bld 176 (*)    Calcium 8.1 (*)    All other components within normal limits  HEMOGLOBIN A1C - Abnormal; Notable for the following components:   Hgb A1c MFr Bld 6.6 (*)    All other components within normal limits  GLUCOSE, CAPILLARY - Abnormal; Notable for the following components:   Glucose-Capillary 189 (*)    All other components within normal limits  GLUCOSE, CAPILLARY - Abnormal; Notable for the following components:   Glucose-Capillary 157 (*)    All other components within normal limits  CBC - Abnormal; Notable for the following components:   RBC 3.28 (*)    Hemoglobin 7.9 (*)    HCT 27.2 (*)    MCH 24.1 (*)    MCHC 29.0 (*)    All other components within normal limits  BASIC METABOLIC PANEL - Abnormal; Notable for the following components:   Glucose, Bld 256 (*)    Calcium 8.6 (*)    All other components within normal limits  CULTURE, BLOOD (ROUTINE X 2)  CULTURE, BLOOD (ROUTINE X 2)  MRSA PCR SCREENING  PROTIME-INR  INFLUENZA PANEL BY PCR (TYPE A & B)  FERRITIN  HIV ANTIBODY (ROUTINE TESTING W REFLEX)  TROPONIN I  FOLATE  GLUCOSE, CAPILLARY  I-STAT CG4 LACTIC ACID, ED  I-STAT BETA HCG BLOOD, ED ([facility], [facility], [facility] ONLY)    EKG EKG Interpretation  Date/Time:  Wednesday July 14 2018 15:11:25 EDT Ventricular Rate:  126 PR Interval:    QRS Duration: 108 QT Interval:  414 QTC Calculation: 600 R Axis:   -148 Text Interpretation:  Sinus tachycardia Right ventricular hypertrophy Inferior infarct, old Anterolateral infarct, age indeterminate Prolonged QT interval Confirmed by Jacalyn Lefevre [phone]) on 07/15/2018 1:25:54 PM   Radiology No results found.  Procedures Procedures (including critical care time)  Medications  Ordered in ED Medications  dicyclomine (BENTYL) tablet 20 mg (20 mg Oral Given 07/14/18 2211)  enoxaparin (LOVENOX) injection 40 mg (40 mg Subcutaneous Not Given 07/16/18 2136)  acetaminophen (TYLENOL) tablet 650 mg (has no administration in time range)    Or  acetaminophen (TYLENOL) suppository 650 mg (has no administration in time range)  insulin aspart (novoLOG) injection 0-9 Units (0 Units Subcutaneous Not Given 07/16/18 1835)  insulin glargine (LANTUS) injection 10 Units (10 Units Subcutaneous Given 07/16/18 0951)  piperacillin-tazobactam (ZOSYN) IVPB 3.375 g (3.375 g Intravenous New Bag/Given 07/17/18 0251)  guaiFENesin-dextromethorphan (ROBITUSSIN DM) 100-10 MG/5ML syrup 5 mL (5 mLs Oral Given 07/17/18 0423)  potassium chloride SA (K-DUR,KLOR-CON) CR tablet 40 mEq (40 mEq Oral Given 07/16/18 0951)  hydrocortisone sodium succinate (SOLU-CORTEF) 100 MG injection 50 mg (50 mg Intravenous Given 07/16/18 2345)  sodium chloride 0.9 % bolus 250 mL (0 mLs Intravenous Stopped 07/14/18 1906)  piperacillin-tazobactam (ZOSYN) IVPB 3.375 g (0 g Intravenous Stopped 07/14/18 1906)  vancomycin (VANCOCIN) IVPB 1000 mg/200 mL premix (0 mg Intravenous Stopped 07/14/18 1941)  acetaminophen (TYLENOL) tablet 650 mg (650 mg Oral Given 07/14/18 1830)  furosemide (LASIX) injection 20 mg (20 mg Intravenous Given 07/14/18 2209)  Influenza vac split quadrivalent PF (FLUARIX) injection 0.5 mL (0.5 mLs Intramuscular Given 07/16/18 0952)  furosemide (LASIX) injection 20 mg (20 mg Intravenous Given 07/15/18 1137)  LORazepam (ATIVAN) tablet 0.5 mg (0.5 mg Oral Given 07/16/18 1355)  LORazepam (ATIVAN) tablet 0.5 mg (0.5 mg Oral Given 07/16/18 1845)     Initial Impression / Assessment and Plan / ED Course  I have reviewed the triage vital signs and the nursing notes.  Pertinent labs & imaging results that were available during my care of the patient were reviewed by me and considered in my medical decision making (see chart for  details).     Patient presents with increasing shortness of breath.  On chronic oxygen supplementation. Question to me acquired pneumonia.  Given history of cardiomyopathy, given fluids judiciously.  Covered with broad-spectrum antibiotics.  Discussed with family medicine teaching service and will see patient in the emergency department and admit. Final Clinical Impressions(s) / ED Diagnoses   Final diagnoses:  Community acquired pneumonia, unspecified laterality  Acute on chronic congestive heart failure, unspecified heart failure type [facility])    ED Discharge Orders    None       Loren Racer, MD 07/17/18 [phone]

## 2018-07-14 NOTE — ED Notes (Signed)
ED Provider at bedside. 

## 2018-07-14 NOTE — ED Notes (Signed)
Patient transported to X-ray 

## 2018-07-14 NOTE — ED Triage Notes (Signed)
Arrived by EMS. Onset one day ago developed shortness of breath with cough, dizziness and light headed. Symptoms continued today. Alert answering and following commands appropriate.

## 2018-07-14 NOTE — H&P (Addendum)
Family Medicine Teaching [facility] Admission History and Physical Service Pager: [phone]  Patient name: Makayla Vasquez Medical record number: [id number] Date of birth: December 13, 1980 Age: 37 y.o. Gender: female  Primary Care Provider: Wendee Beavers, DO Consultants: none Code Status: FULL   Chief Complaint: shortness of breath  Assessment and Plan: Makayla Vasquez is a 37 y.o. female presenting with shortness of breath and found to have sepsis. PMH is significant for Interstitial lung disease on 2-4L home O2, congenital complete AV block, CHF, cardiomyopathy, T2DM, adrenal insufficiency, lupus, cor pulmonale, GERD, bipolar, prolonged QT syndrome.  Sepsis of unknown source: On arrival to the ED patient febrile to 101.4, tachycardic to 125 and tachypneic with a respiratory rate of 34.  Patient's blood pressure 105/79.  Patient soon became hypotensive to 89/56.  Sepsis protocol was initiated but given patient's cardiac history only given 250 cc bolus in ED.  Patient also started on Vanco and Zosyn for antibiotics.  Patient only reporting recent cold-like symptoms for the past 3 to 4 days along with worsening shortness of breath that began last night.  CXR with pulmonary edema superimposed on chronic interstitial lung disease.  No signs of pneumonia on CXR.  Patient reports that she does not know if she has been having fevers but she has been having chills for the past few days.  Patient denies dysuria, vomiting, diarrhea.  No other signs of infection.  Patient denies any recent tick bites.  Patient does report that she has been around some sick children.  Differential includes: pneumonia (patient recently treated for CAP in July which she reports completely resolved) although there are no signs of pneumonia on CXR or viral URI given recent sick contacts.  LA 1.65 in ED. Blood cultures and urine culture collected.  Influenza negative.  Could be related to patient not taking her beta-blocker causing  tachycardia.  Could also be related to pulmonary edema from fluid overload causing patient to be tachypneic and short of breath.  Patient does have hypotension in her history and she is not very far off of her baseline. Well's score 1.5 for tachycardia.  -Admit to stepdown, attending Dr. Lum Babe -Monitor vitals per unit -Follow-up blood cultures and urine culture -Continue Vanc and Zosyn for now pending clinical improvement -Give 250 cc bolus prn hypotension -Consider stress dose of prednisone if patient does not improve  Acute on chronic HFpEF: BNP 259. Last echo 11/09/2017 with EF of 50%.  Mitral valve with moderate regurgitation and PA peak pressure of 39 mmHg.  CXR with pulmonary edema superimposed on ILD.  Patient with diffuse crackles on exam.  Increased shortness of breath upon laying down.  JVP noted on exam.  Patient with no significant lower extremity edema. -Cardiology consulted in ED, appreciate recommendations -We will give Lasix 20 mg IV x1 per cardiology -Monitor vitals closely after giving Lasix in setting of possible sepsis -Repeat echo -A.m. EKG -Trend troponins -Daily weights/ I's and O's  Interstitial lung disease on 2-4L home O2  Pulmonary HTN  Cor pulmonale: Patient with significant lung disease.  Reports that she is on 7.5 mg prednisone daily as well as 2 to 4 L of home oxygen.  Patient reports that she uses 4 L at night when sleeping and during the day usually uses 2 L but will sometimes require 4.  Patient does not have any inhalers at home.  Shortness of breath worse upon laying per patient. -Monitor respiratory status -Consider stress dose steroids if patient does  not improve. -Consider pulmonology consult  Congenital complete AV block  prolonged QT syndrome   cardiomyopathy:  -Cardiology consult in ED, appreciate recommendations -EKG stable from prior -Avoid QT prolonging drugs -Monitor on telemetry -Holding metoprolol in setting of hypotension but may restart  given patient's tachycardia  Anemia: Hgb 9.0 in ED. BL ~ 10.5.  - Anemia panel  T2DM: Last hemoglobin A1c 7.6 on 04/12/2018.  Patient on Lantus 15 units and NovoLog 8 units 3 times daily with meals.  Patient reports compliance. -Repeat A1c -Lantus 10 units with sensitive sliding scale  adrenal insufficiency: Patient on 7.5 mg prednisone daily.  Consider stress to steroids if patient does not improve.  Lupus: Patient on tacrolimus and cellcept for immunosuppression given lupus.  Patient also reports joint pain throughout with her lupus.  In the setting of possible sepsis consulted pharmacy who recommended holding tacrolimus during acute illness. -Restart tacrolimus, cellcept as soon as patient is clinically improving and no longer septic -Continue Bentyl for muscle spasms -Patient reports that she was on Bactrim for prophylaxis but that she has not been taking this for the past few weeks  GERD, chronic, stable:  -Monitor -Patient does not appear to be on PPI, consider starting PPI in setting of chronic prednisone use  Bipolar, stable: -Patient not currently on any medications  FEN/GI: Heart healthy/ Carb modified Prophylaxis: Lovenox  Disposition: Admit to stepdown  History of Present Illness:  Makayla Vasquez is a 37 y.o. female presenting with shortness of breath. Felt more short of breath when laying down. On 4L at night, and sometimes 2 during the day. Has interstitial. No chest pain. She reports a cough for a week and a half but not coughing anything up. She has been having chills for 3 days or maybe longer.  She reports being around children relatives who had cold-like symptoms.  Patient reports that she started having cold-like symptoms 3 to 4 days ago.  Shortness of breath only started last night.  No new leg swelling.  Patient reports that she was out of her medications for 2 to 3 days but she has taken everything today.  Review Of Systems: Per HPI with the following additions:    Review of Systems  Constitutional: Positive for chills. Negative for fever.  HENT: Positive for congestion.   Eyes: Negative for blurred vision and double vision.  Respiratory: Positive for cough, shortness of breath and wheezing. Negative for sputum production.   Cardiovascular: Negative for chest pain and leg swelling.  Gastrointestinal: Positive for abdominal pain, diarrhea and nausea. Negative for blood in stool, constipation and vomiting.  Genitourinary: Negative for dysuria and frequency.  Musculoskeletal: Positive for joint pain and myalgias.  Skin: Negative for rash.  Neurological: Positive for dizziness and headaches.  Endo/Heme/Allergies: Negative for polydipsia.    Patient Active Problem List   Diagnosis Date Noted  . Upper airway cough syndrome 06/18/2018  . Cor pulmonale, chronic (HCC) 04/08/2018  . Abnormal uterine bleeding 02/14/2018  . Dyspareunia, female 05/14/2017  . 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 On Cellcept therapy   . Systemic lupus erythematosus (HCC)   . Long term (current) use of systemic steroids   . Chronic diastolic congestive heart failure (HCC)   . Anemia of chronic disease   . Bipolar affective disorder in remission (HCC)   . 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 Adrenal insufficiency (HCC)   . Antisynthetase syndrome (HCC)   . Idiopathic hypotension 07/08/2015  . Hyperlipidemia associated  with type 2 diabetes mellitus (HCC) 05/04/2015  . Diabetes mellitus type 2 in obese (HCC)   . Chronic respiratory failure with hypoxia (HCC) 08/07/2014  . ILD (interstitial lung disease) (HCC) 06/19/2014  . Congestive dilated cardiomyopathy (HCC) 05/31/2014  . Chronic systolic heart failure (HCC) 02/26/2012  . PPM-Medtronic   . Congenital complete AV block   . GERD (gastroesophageal reflux disease)   . 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 Polycystic ovaries 12/17/2006    Past Medical History: Past Medical History:   Diagnosis Date  . Anxiety   . Arthritis    rheumatoid arthritis- mild, no rheumatology care   . Asthma    seasonal allergies   . Asymptomatic LV dysfunction    a. Echo in Dec 2011 with EF 35 to 40%. Felt to be due to paced rhythm. b. EF 25-30% in 07/2014.  . Bipolar affective disorder (HCC)   . Cardiac pacemaker    a. Since age 15 in 99. b. Upgrade to BiV in 2013.  [name] Carpal tunnel syndrome of right wrist   . CHF (congestive heart failure) (HCC)   . Congenital complete AV block   . Cor pulmonale, chronic (HCC) 04/08/2018  . Depression    bipolar  . Diabetes mellitus without complication (HCC)   . GERD (gastroesophageal reflux disease)   . Hypertension   . Interstitial lung disease (HCC)   . Lupus (HCC)   . Lupus (systemic lupus erythematosus) (HCC)   . Obesity   . Pneumonitis    a. a/w hypoxia - inflammatory - large workup 07/2014.  . Presence of permanent cardiac pacemaker   . Seizures (HCC)    as a child- from high fever  . Sinus tachycardia     Past Surgical History: Past Surgical History:  Procedure Laterality Date  . ATRIAL TACH ABLATION N/A 08/14/2014   Procedure: ATRIAL TACH ABLATION;  Surgeon: Marinus Maw, MD;  Location: [facility] CATH LAB;  Service: Cardiovascular;  Laterality: N/A;  . BI-VENTRICULAR PACEMAKER UPGRADE N/A 03/08/2012   Procedure: BI-VENTRICULAR PACEMAKER UPGRADE;  Surgeon: Marinus Maw, MD;  Location: [facility] CATH LAB;  Service: Cardiovascular;  Laterality: N/A;  . CARPAL TUNNEL WITH CUBITAL TUNNEL Right 07/26/2013   Procedure: RIGHT LIMITED OPEN CARPAL TUNNEL RELEASE ,  RIGHT CUBITAL TUNNEL RELEASE, INSITU VERSES ULNAR NERVE DECOMPRESSION AND ANTERIOR TRANSPOSITION;  Surgeon: Dominica Severin, MD;  Location: [facility] OR;  Service: Orthopedics;  Laterality: Right;  . CESAREAN SECTION    . CHOLECYSTECTOMY    . CYST EXCISION  12/10/2012   THYROID  . INSERT / REPLACE / REMOVE PACEMAKER     2001  . IUD REMOVAL  11/03/2011   Procedure: INTRAUTERINE DEVICE (IUD)  REMOVAL;  Surgeon: Myra C. Marice Potter, MD;  Location: [facility] ORS;  Service: Gynecology;  Laterality: N/A;  . NASAL FRACTURE SURGERY     /w plate   . RIGHT HEART CATHETERIZATION N/A 10/26/2014   Procedure: RIGHT HEART CATH;  Surgeon: Dolores Patty, MD;  Location[facility] CATH LAB;  Service: Cardiovascular;  Laterality: N/A;  . THROAT SURGERY  1994   s/p laser treatment  . THYROGLOSSAL DUCT CYST N/A 12/10/2012   Procedure: REVISION OF THYROGLOSSAL DUCT CYST EXCISION;  Surgeon: Serena Colonel, MD;  Location: [facility] OR;  Service: ENT;  Laterality: N/A;  Revision of Thyroglossal Duct Cyst Excision  . VIDEO BRONCHOSCOPY Bilateral 06/19/2014   Procedure: VIDEO BRONCHOSCOPY WITHOUT FLUORO;  Surgeon: Kalman Shan, MD;  Location: [facility] ENDOSCOPY;  Service: Cardiopulmonary;  Laterality: Bilateral;  Social History: Social History   Tobacco Use  . Smoking status: Former Smoker    Packs/day: 0.25    Years: 0.50    Pack years: 0.12    Types: Cigarettes    Last attempt to quit: 07/25/1996    Years since quitting: 21.9  . Smokeless tobacco: Never Used  Substance Use Topics  . Alcohol use: No  . Drug use: No   Additional social history: Former smoker, reports drinking alcoholic beverage 1 time per year and denies illicit drug use Please also refer to relevant sections of EMR.  Family History: Family History  Problem Relation Age of Onset  . Heart disease Mother        CHF (no details)  . Hypertension Mother   . Lupus Mother   . Heart disease Father        Murmur  . Heart disease Sister 61        No details.  History of a pacemaker    Allergies and Medications: Allergies  Allergen Reactions  . 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 Sertraline Hcl Hives  . Tape Other (See Comments)    Burns skin   No current facility-administered medications on file prior to encounter.    Current Outpatient Medications on File Prior to Encounter   Medication Sig Dispense Refill  . dicyclomine (BENTYL) 20 MG tablet Take 20 mg by mouth every 6 (six) hours.    . Etonogestrel (NEXPLANON [place]) Inject 1 application into the skin once.     . furosemide (LASIX) 20 MG tablet TAKE 1 TABLET BY MOUTH EVERY DAY (Patient taking differently: Take 20 mg by mouth daily. ) 90 tablet 3  . LANTUS SOLOSTAR 100 UNIT/ML Solostar Pen INJECT 15 UNITS SUBCUTANEOUSLY EVERY DAY (Patient taking differently: Inject 15 Units into the skin daily. ) 15 pen 2  . metFORMIN (GLUCOPHAGE) 1000 MG tablet Take 1 tablet (1,000 mg total) by mouth 2 (two) times daily with a meal. 90 tablet 3  . metoprolol succinate (TOPROL XL) 25 MG 24 hr tablet Take 1 tablet (25 mg total) by mouth 2 (two) times daily. 60 tablet 11  . mycophenolate (CELLCEPT) 500 MG tablet Take 1,000 mg by mouth 2 (two) times daily.     [name] NOVOLOG FLEXPEN 100 UNIT/ML FlexPen INJECT 8 UNITS INTO THE SKIN 3 (THREE) TIMES DAILY WITH MEALS. (Patient taking differently: Inject 8 Units into the skin 3 (three) times daily with meals. ) 15 pen 5  . OXYGEN Inhale 4 L into the lungs.    . potassium chloride SA (K-DUR,KLOR-CON) 20 MEQ tablet TAKE 2 TABLETS BY MOUTH ONCE DAILY (Patient taking differently: Take 40 mEq by mouth daily. ) 60 tablet 3  . predniSONE (DELTASONE) 10 MG tablet Take 3 tabs (30 mg total) for 7 days, then 2 tabs ( 20 mg total) for 7 days, then 1 tab (10 mg total) for 7 days. (Patient taking differently: Take 7.5 mg by mouth daily. ) 42 tablet 0  . sulfamethoxazole-trimethoprim (BACTRIM DS,SEPTRA DS) 800-160 MG tablet Take 1 tablet by mouth every other day.     . tacrolimus (PROGRAF) 0.5 MG capsule Take 1.5 mg by mouth 2 (two) times daily.    . Azelastine-Fluticasone 137-50 MCG/ACT SUSP 1 spray per nostril twice a day (Patient not taking: Reported on 07/14/2018) 1 Bottle 1  . benzonatate (TESSALON) 200 MG capsule Take 1 capsule (200 mg total) by mouth 3 (  three) times daily as needed for cough. (Patient not  taking: Reported on 07/14/2018) 30 capsule 1  . EASY TOUCH PEN NEEDLES 31G X 8 MM MISC USE AS DIRECTED 100 each 11  . traZODone (DESYREL) 50 MG tablet Take 1 tablet (50 mg total) by mouth at bedtime as needed for sleep. (Patient not taking: Reported on 07/14/2018) 30 tablet 3    Objective: BP 92/61   Pulse (!) 117   Temp (!) 101.4 F (38.6 C) (Oral)   Resp (!) 48   Ht 5\' 5"  (1.651 m)   Wt 59 kg   SpO2 100%   BMI 21.63 kg/m  Exam: General: NAD, pleasant, laying in bed on side Eyes: PERRL, EOMI, no conjunctival pallor or injection ENTM: Moist mucous membranes, no pharyngeal erythema or exudate Neck: Supple, JVP Cardiovascular: RRR, no m/r/g, no LE edema Respiratory: crackles in BL bases, normal work of breathing on 4 L [place] Gastrointestinal: soft, nontender, nondistended MSK: moves 4 extremities equally Derm: no rashes appreciated Neuro: CN II-XII grossly intact Psych: AOx3, appropriate affect  Labs and Imaging: CBC BMET  Recent Labs  Lab 07/14/18 1530  WBC 9.8  HGB 9.0*  HCT 31.0*  PLT 370   Recent Labs  Lab 07/14/18 1530  NA 140  K 3.5  CL 106  CO2 23  BUN 8  CREATININE 0.69  GLUCOSE 140*  CALCIUM 7.9*     BNP 259 trop 0.03 LA 1.65  Dg Chest 2 View  Result Date: 07/14/2018 CLINICAL DATA:  Shortness of breath with cough, dizziness and lightheadedness. EXAM: CHEST - 2 VIEW COMPARISON:  06/12/2018 and CT chest 04/08/2018. FINDINGS: Trachea is midline. Pacemaker lead tips project over the right atrium, right ventricle and coronary sinus. Heart is enlarged. Mild diffuse mixed interstitial and airspace opacification appears slightly worsened from 06/12/2018. No definite pleural fluid. IMPRESSION: Suspect pulmonary edema superimposed on chronic interstitial lung disease. Electronically Signed   By: Leanna Battles M.D.   On: 07/14/2018 16:40     Narya Beavin, [place], DO 07/14/2018, 5:59 PM PGY-2,  Family Medicine FPTS Intern pager: [phone], text pages  welcome

## 2018-07-15 ENCOUNTER — Inpatient Hospital Stay (HOSPITAL_COMMUNITY): Payer: Medicare Other

## 2018-07-15 ENCOUNTER — Other Ambulatory Visit: Payer: Self-pay

## 2018-07-15 DIAGNOSIS — J849 Interstitial pulmonary disease, unspecified: Secondary | ICD-10-CM

## 2018-07-15 DIAGNOSIS — I509 Heart failure, unspecified: Secondary | ICD-10-CM

## 2018-07-15 DIAGNOSIS — I340 Nonrheumatic mitral (valve) insufficiency: Secondary | ICD-10-CM

## 2018-07-15 DIAGNOSIS — J9601 Acute respiratory failure with hypoxia: Secondary | ICD-10-CM

## 2018-07-15 DIAGNOSIS — D649 Anemia, unspecified: Secondary | ICD-10-CM

## 2018-07-15 LAB — RESPIRATORY PANEL BY PCR
ADENOVIRUS-RVPPCR: NOT DETECTED
Bordetella pertussis: NOT DETECTED
CORONAVIRUS OC43-RVPPCR: NOT DETECTED
Chlamydophila pneumoniae: NOT DETECTED
Coronavirus 229E: NOT DETECTED
Coronavirus HKU1: NOT DETECTED
Coronavirus NL63: NOT DETECTED
Influenza A: NOT DETECTED
Influenza B: NOT DETECTED
METAPNEUMOVIRUS-RVPPCR: NOT DETECTED
Mycoplasma pneumoniae: NOT DETECTED
PARAINFLUENZA VIRUS 1-RVPPCR: NOT DETECTED
PARAINFLUENZA VIRUS 3-RVPPCR: NOT DETECTED
Parainfluenza Virus 2: NOT DETECTED
Parainfluenza Virus 4: NOT DETECTED
RESPIRATORY SYNCYTIAL VIRUS-RVPPCR: NOT DETECTED
RHINOVIRUS / ENTEROVIRUS - RVPPCR: DETECTED — AB

## 2018-07-15 LAB — TROPONIN I
TROPONIN I: 0.04 ng/mL — AB (ref <0.03)
Troponin I: <0.03 ng/mL (ref <0.03)

## 2018-07-15 LAB — RETICULOCYTES
RBC.: 3.44 MIL/uL — ABNORMAL LOW (ref 3.87–5.11)
Retic Count, Absolute: 61.9 10*3/uL (ref 19.0–186.0)
Retic Ct Pct: 1.8 % (ref 0.4–3.1)

## 2018-07-15 LAB — FOLATE: Folate: 10.4 ng/mL (ref >5.9)

## 2018-07-15 LAB — CBC
HCT: 28.2 % — ABNORMAL LOW (ref 36.0–46.0)
HEMOGLOBIN: 8.4 g/dL — AB (ref 12.0–15.0)
MCH: 24.4 pg — AB (ref 26.0–34.0)
MCHC: 29.8 g/dL — ABNORMAL LOW (ref 30.0–36.0)
MCV: 82 fL (ref 78.0–100.0)
Platelets: 323 10*3/uL (ref 150–400)
RBC: 3.44 MIL/uL — AB (ref 3.87–5.11)
RDW: 15.7 % — ABNORMAL HIGH (ref 11.5–15.5)
WBC: 7.4 10*3/uL (ref 4.0–10.5)

## 2018-07-15 LAB — GLUCOSE, CAPILLARY
Glucose-Capillary: 97 mg/dL (ref 70–99)
Glucose-Capillary: 139 mg/dL — ABNORMAL HIGH (ref 70–99)
Glucose-Capillary: 218 mg/dL — ABNORMAL HIGH (ref 70–99)
Glucose-Capillary: 189 mg/dL — ABNORMAL HIGH (ref 70–99)

## 2018-07-15 LAB — BASIC METABOLIC PANEL
ANION GAP: 9 (ref 5–15)
BUN: 8 mg/dL (ref 6–20)
CHLORIDE: 106 mmol/L (ref 98–111)
CO2: 24 mmol/L (ref 22–32)
Calcium: 7.7 mg/dL — ABNORMAL LOW (ref 8.9–10.3)
Creatinine, Ser: 0.6 mg/dL (ref 0.44–1.00)
GFR calc non Af Amer: >60 mL/min (ref >60)
Glucose, Bld: 149 mg/dL — ABNORMAL HIGH (ref 70–99)
Potassium: 3.3 mmol/L — ABNORMAL LOW (ref 3.5–5.1)
Sodium: 139 mmol/L (ref 135–145)

## 2018-07-15 LAB — FERRITIN: Ferritin: 124 ng/mL (ref 11–307)

## 2018-07-15 LAB — VITAMIN B12: VITAMIN B 12: 116 pg/mL — AB (ref 180–914)

## 2018-07-15 LAB — ECHOCARDIOGRAM COMPLETE
Height: 65 in
Weight: 2194.02 oz

## 2018-07-15 LAB — IRON AND TIBC
IRON: 13 ug/dL — AB (ref 28–170)
SATURATION RATIOS: 6 % — AB (ref 10.4–31.8)
TIBC: 224 ug/dL — ABNORMAL LOW (ref 250–450)
UIBC: 211 ug/dL

## 2018-07-15 LAB — HIV ANTIBODY (ROUTINE TESTING W REFLEX): HIV Screen 4th Generation wRfx: NONREACTIVE

## 2018-07-15 LAB — MRSA PCR SCREENING: MRSA by PCR: NEGATIVE

## 2018-07-15 MED ORDER — HYDROCORTISONE NA SUCCINATE PF 100 MG IJ SOLR
50.0000 mg | Freq: Four times a day (QID) | INTRAMUSCULAR | Status: DC
  Administered 2018-07-15 – 2018-07-17 (×7): 50 mg via INTRAVENOUS
  Filled 2018-07-15 (×7): qty 2

## 2018-07-15 MED ORDER — INFLUENZA VAC SPLIT QUAD 0.5 ML IM SUSY
0.5000 mL | PREFILLED_SYRINGE | INTRAMUSCULAR | Status: AC
  Administered 2018-07-16: 0.5 mL via INTRAMUSCULAR
  Filled 2018-07-15: qty 0.5

## 2018-07-15 MED ORDER — POTASSIUM CHLORIDE CRYS ER 20 MEQ PO TBCR
40.0000 meq | EXTENDED_RELEASE_TABLET | Freq: Every day | ORAL | Status: DC
  Administered 2018-07-15 – 2018-07-17 (×3): 40 meq via ORAL
  Filled 2018-07-15 (×3): qty 2

## 2018-07-15 MED ORDER — FUROSEMIDE 10 MG/ML IJ SOLN
20.0000 mg | Freq: Once | INTRAMUSCULAR | Status: AC
  Administered 2018-07-15: 20 mg via INTRAVENOUS
  Filled 2018-07-15: qty 2

## 2018-07-15 NOTE — Progress Notes (Signed)
Family Medicine Teaching Service Daily Progress Note Intern Pager: [phone]  Patient name: Makayla Vasquez Medical record number: [id number] Date of birth: 03/04/1981 Age: 37 y.o. Gender: female  Primary Care Provider: Wendee Beavers, DO Consultants: cards Code Status: Full code  Pt Overview and Major Events to Date:  Hospital Day 1 Admitted: 07/14/2018  Assessment and Plan: Makayla Vasquez is a 37 y.o. female presenting with shortness of breath and found to have sepsis. PMH is significant for Interstitial lung disease on 2-4L home O2, congenital complete AV block, CHF, cardiomyopathy, T2DM, adrenal insufficiency, lupus, cor pulmonale, GERD, bipolar, prolonged QT syndrome.  Sepsis of unknown source Upon admission, febrile to 101.4, tachycardia to 125, tachypnea to 34.  Blood pressure 105/79.  289/56.  Lactic acid 1.65 status post 250 cc bolus and started on pink/Zosyn.  Patient reports positive sick contacts with cold-like symptoms for 3 to 4 days. Chest x-ray significant for chronic ILD and pulmonary edema status post 20 mg Lasix x1.  No pneumonia.  Influenza negative.  White count this morning 7.4 UTI found to have many bacteria.  Urine cultures pending Patient denies any other sources or signs of infection.  Patient with increased risk factors in the setting of chronic immunosuppression.  Follow blood cultures and urine culture  Continue Vanc & Zosyn can discontinue with clinical improvement  Can consider stress testing prednisone without improvement  RVP pending  Patient found to have bacteria with hemoglobinuria and ketones 5, many bacteria.  Urine culture pending  MRSA PCR negative  Acute on chronic HFpEF BNP 259. Last echo 11/09/2017 with EF of 50%.  Mitral valve with moderate regurgitation and PA peak pressure of 39 mmHg.  CXR with pulmonary edema superimposed on ILD.  Patient with diffuse crackles on exam.  Increased shortness of breath upon laying down.  JVP noted on  exam.  Patient with no significant lower extremity edema.  Cardiology consulted in ED, appreciate recommendations  Status post IV Lasix 20 mg, vitals remained stable  Consider daily dosing pending patient's clinical status  AM EKG pending read   Follow-up on echo  Troponins: 0.04 > 0.03 >.0.03  BNP 259.5  Strict I's and O's  Anemia Hemoglobin 8.4, baseline around 10.  Anemia panel significant for iron deficiency.  Vitamin B12 116.  Start B12 supplements and iron after acute illness  Interstitial lung disease on 2-4L home O2  Pulmonary HTN  Cor pulmonale Patient reports that she is on 7-1/2 mg prednisone daily at home.  She also uses 2 to 4 L of oxygen.  She typically uses 4 L at night with sleeping and 2 L during the day.  She will sometimes have to titrate it 4 L during the day.  She reports orthopnea.  This morning, patient was seen on 3 L D satting to 80s.  She was increased to 4 L and improved to 90s.    Continue to monitor respiratory status -likely need to maintain patient on 4 L   Titrate oxygen as needed to keep sats greater than 92%  Consider pulmonary consult  Consider stress dose steroids if patient does not improve  Congenital complete AV block  prolonged QT syndrome   cardiomyopathy  Cardiology following  Held metoprolol in setting of hypotension, patient's heart rate within normal limits  EKG, prolonged QT syndrome  Avoid QT prolonging drugs  PTT 13.9, INR 1.08  T2DM  Last hemoglobin A1c 7.6 on 04/12/2018.  Patient on Lantus 15 units and NovoLog 8 units  3 times daily with meals.  Patient reports compliance.  Fasting glucose 149 this morning  Follow-up repeat A1c  Lantus 10 units nightly with sensitive sliding scale 3 times daily WC  Adrenal insufficiency , chronic  stable  Consider stress test as above  Lupus, chronic  stable Patient on tacrolimus for immunosuppression given lupus.  Patient also reports joint pain throughout with  her lupus.  In the setting of possible sepsis consulted pharmacy who recommended holding tacrolimus during acute illness.  Holding tacrolimus in setting of possible sepsis.  Will restart as soon his clinical improvement is seen  Continue Bentyl for muscle spasms.  Reports being on Bactrim for prophylaxis though she has not been taking this for the last few weeks  GERD, chronic  stable Patient does not appear to be on PPI at home  Consider starting PPI in setting of chronic prednisone use-Monitor  Bipolar, chronic  stable  Patient not currently on any medications  F: s/p 250cc bolus 9/25 E: replete PRN N: PO, Heart Healhty  GI ppx: none, avoid zofran DVT ppx: Lovenox  Disposition: Admit to stepdown  Continuous Infusions: . piperacillin-tazobactam (ZOSYN)  IV 3.375 g (07/15/18 0231)  . vancomycin 500 mg (07/15/18 0232)   PRN Meds: acetaminophen **OR** acetaminophen, dicyclomine, guaiFENesin-dextromethorphan  ================================================= ================================================= Subjective:  Patient reports doing well this morning.  Echo techs in the room during interview  Objective: Temp:  [97.7 F (36.5 C)-101.4 F (38.6 C)] 97.7 F (36.5 C) (09/26 0730) Pulse Rate:  [84-125] 84 (09/26 0730) Resp:  [0-48] 14 (09/26 0730) BP: (88-120)/(54-90) 97/62 (09/26 0730) SpO2:  [88 %-100 %] 96 % (09/26 0730) Weight:  [59 kg-63 kg] 62.2 kg (09/26 0500) Intake/Output 09/25 0701 - 09/26 0700 In: 1850 [P.O.:700; I.V.:500; IV Piggyback:650] Out: 1300 [Urine:1300]  Physical Exam:  Gen: NAD, alert, non-toxic, well-nourished, well-appearing, laying comfortably on left side HEENT: Normocephaic, atraumatic. Clear conjuctiva, no scleral icterus and injection.  CV: Regular rate and rhythm.  Normal S1-S2.  No bilateral lower extremity edema. Resp: Clear to auscultation bilaterally.  No wheezing, rales, abnormal lung sounds.  No increased work of  breathing appreciated. Abd: Nontender and nondistended on palpation to all 4 quadrants.  Positive bowel sounds. Psych: Cooperative with exam. Pleasant. Makes eye contact. Extremities: Full ROM   Laboratory: Recent Labs  Lab 07/14/18 1530 07/15/18 0325  WBC 9.8 7.4  HGB 9.0* 8.4*  HCT 31.0* 28.2*  PLT 370 323   Recent Labs  Lab 07/14/18 1530 07/15/18 0325  NA 140 139  K 3.5 3.3*  CL 106 106  CO2 23 24  BUN 8 8  CREATININE 0.69 0.60  CALCIUM 7.9* 7.7*  PROT 6.3*  --   BILITOT 0.9  --   ALKPHOS 52  --   ALT 19  --   AST 35  --   GLUCOSE 140* 149*   Future Labs:  AM CBC, BMP, and A1C   Imaging/Diagnostic Tests: Dg Chest 2 View  Result Date: 07/14/2018 CLINICAL DATA:  Shortness of breath with cough, dizziness and lightheadedness. EXAM: CHEST - 2 VIEW COMPARISON:  06/12/2018 and CT chest 04/08/2018. FINDINGS: Trachea is midline. Pacemaker lead tips project over the right atrium, right ventricle and coronary sinus. Heart is enlarged. Mild diffuse mixed interstitial and airspace opacification appears slightly worsened from 06/12/2018. No definite pleural fluid. IMPRESSION: Suspect pulmonary edema superimposed on chronic interstitial lung disease. Electronically Signed   By: Leanna Battles M.D.   On: 07/14/2018 16:40  Melene Plan, MD 07/15/2018, 8:07 AM PGY-1, [facility] FPTS Intern pager: [phone], text pages welcome

## 2018-07-15 NOTE — Progress Notes (Signed)
  Echocardiogram 2D Echocardiogram has been performed.  Leta Jungling M 07/15/2018, 9:55 AM

## 2018-07-16 DIAGNOSIS — N390 Urinary tract infection, site not specified: Secondary | ICD-10-CM

## 2018-07-16 DIAGNOSIS — B9689 Other specified bacterial agents as the cause of diseases classified elsewhere: Secondary | ICD-10-CM

## 2018-07-16 DIAGNOSIS — B961 Klebsiella pneumoniae [K. pneumoniae] as the cause of diseases classified elsewhere: Secondary | ICD-10-CM

## 2018-07-16 DIAGNOSIS — A419 Sepsis, unspecified organism: Principal | ICD-10-CM

## 2018-07-16 LAB — BASIC METABOLIC PANEL
Anion gap: 9 (ref 5–15)
BUN: 9 mg/dL (ref 6–20)
CALCIUM: 8.1 mg/dL — AB (ref 8.9–10.3)
CO2: 26 mmol/L (ref 22–32)
Chloride: 100 mmol/L (ref 98–111)
Creatinine, Ser: 0.61 mg/dL (ref 0.44–1.00)
GFR calc Af Amer: >60 mL/min (ref >60)
GLUCOSE: 176 mg/dL — AB (ref 70–99)
Potassium: 4 mmol/L (ref 3.5–5.1)
SODIUM: 135 mmol/L (ref 135–145)

## 2018-07-16 LAB — GLUCOSE, CAPILLARY: GLUCOSE-CAPILLARY: 157 mg/dL — AB (ref 70–99)

## 2018-07-16 LAB — HEMOGLOBIN A1C
Hgb A1c MFr Bld: 6.6 % — ABNORMAL HIGH (ref 4.8–5.6)
Mean Plasma Glucose: 142.72 mg/dL

## 2018-07-16 LAB — CBC
HCT: 27.4 % — ABNORMAL LOW (ref 36.0–46.0)
Hemoglobin: 8.1 g/dL — ABNORMAL LOW (ref 12.0–15.0)
MCH: 24.2 pg — AB (ref 26.0–34.0)
MCHC: 29.6 g/dL — AB (ref 30.0–36.0)
MCV: 81.8 fL (ref 78.0–100.0)
Platelets: 351 10*3/uL (ref 150–400)
RBC: 3.35 MIL/uL — AB (ref 3.87–5.11)
RDW: 15.5 % (ref 11.5–15.5)
WBC: 5 10*3/uL (ref 4.0–10.5)

## 2018-07-16 MED ORDER — LORAZEPAM 0.5 MG PO TABS
0.5000 mg | ORAL_TABLET | Freq: Once | ORAL | Status: AC
  Administered 2018-07-16: 0.5 mg via ORAL
  Filled 2018-07-16 (×2): qty 1

## 2018-07-16 MED ORDER — LORAZEPAM 0.5 MG PO TABS
0.5000 mg | ORAL_TABLET | Freq: Once | ORAL | Status: AC | PRN
  Administered 2018-07-16: 0.5 mg via ORAL

## 2018-07-16 NOTE — Progress Notes (Signed)
Family Medicine Teaching Service Daily Progress Note Intern Pager: [phone]  Patient name: Makayla Vasquez Medical record number: [id number] Date of birth: Oct 20, 1981 Age: 37 y.o. Gender: female  Primary Care Provider: Wendee Beavers, DO Consultants: Cardiology Code Status: Full code  Pt Overview and Major Events to Date:  Admitted: 07/14/2018 Hospital Day: 3   Assessment and Plan: Makayla Vasquez a 37 y.o.femalepresenting with shortness of breath and presented septic. PMH is significant forlupus, interstitial lung disease on 2-4L home O2, congenital complete AV block, CHF, cardiomyopathy, T2DM, adrenal insufficiency, cor pulmonale, GERD, bipolar, prolonged QT syndrome.  #Sepsis Criteria, likely 2/2 to UTI Patient remains afebrile, with normal vital signs.  Patient's blood pressure is soft, however she reports that her typical numbers are 90 systolic.  RVP positive for rhinovirus.  Chest x-ray with chronic ILD and pulmonary edema, no pneumonia.  UA found to have bacteria.  Urine culture with E. Coli.  MRSA negative  Continue to follow blood cultures.  Waiting on urine culture sensitivities.  Discontinued vancomycin.  Keep Zosyn as patient has history of resistant enterococcus.  Patient was stress dosed prednisone overnight for low blood pressures.  She continues to be stable this morning.  #Acute on chronic Systolic HF, Nonischemic cardiomyopathy. S/p medtronic CRT-P device On admission, BNP 259.  Echocardiogram yesterday shows ejection fraction 55% with no wall motion abnormalities, grade 1 diastolic dysfunction.  She is status post Lasix IV 20 mg.  Her troponins trended flat at 0.04> 0.03> 0.03.  Patient reports that she is feeling back to baseline today.  Her most recent cardio outpatient follow-up, patient is on Lasix 20 mg daily.  She was recently stopped on Coreg and started on Toprol-XL 25 mg twice daily for better heart rate control.   Titrate nasal cannula as needed,  keep patient saturations greater than 92%  Dosing lasix daily   #Iron deficiency anemia, chronic Hemoglobin is 8.1 this morning down from 8.4 yesterday.  Patient has iron deficiency per anemia panel.  Her vitamin B12 is also low at 116.    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  Followed by rheumatology.  On CellCept, tacrolimus, prednisone cream.  Today she feels that her breathing is back at baseline.  Titrate oxygen as needed to keep sats greater than 92%  #Congenital complete AV blockcardiomyopathy Follows with Dr. Shirlee Latch and cardiology she is status post pacer and has EP follow-up.  Held metoprolol in setting of hypotension, patient's heart rate within normal limits  EKG, prolonged QT syndrome  #Prolong QT Syndrome  Avoid QT prolonging drugs  #T2DM A1C 6.6  Fasting glucose 176 this morning  Follow-up repeat A1c  Lantus 10 units nightly with sensitive sliding scale 3 times daily WC  #Adrenal insufficiency ,chronic  stable  Monitor  #Lupus, chronic  stable Patient on tacrolimus for immunosuppression given lupus. Patient also reports joint pain throughout with her lupus. In the setting of possible sepsis consulted pharmacy who recommended holding tacrolimus during acute illness.  Holding tacrolimus in setting of infection.  Will restart as soon his clinical improvement is seen  Continue Bentyl for muscle spasms.  Reports being on Bactrim for prophylaxis though she has not been taking this for the last few weeks  #GERD,chronic  stable Patient does not appear to be on PPI at home  Consider starting  PPI in setting of chronic prednisone use  #Bipolar,chronic  stable  Patient not currently on any medications  E: replete PRN N: PO, Heart Healhty  GI ppx: none, avoid zofran DVT  ppx: Lovenox  Disposition:Admit to stepdown   Medications: Scheduled Meds: . enoxaparin (LOVENOX) injection  40 mg Subcutaneous Q24H  . hydrocortisone sod succinate (SOLU-CORTEF) inj  50 mg Intravenous Q6H  . Influenza vac split quadrivalent PF  0.5 mL Intramuscular Tomorrow-1000  . insulin aspart  0-9 Units Subcutaneous TID WC  . insulin glargine  10 Units Subcutaneous Daily  . potassium chloride SA  40 mEq Oral Daily   Continuous Infusions: . piperacillin-tazobactam (ZOSYN)  IV 3.375 g (07/16/18 0222)  . vancomycin 500 mg (07/16/18 0643)   PRN Meds: acetaminophen **OR** acetaminophen, dicyclomine, guaiFENesin-dextromethorphan  ================================================= ================================================= Subjective:  Patient reports doing well overnight.  She reports that she has back at her baseline of breathing.  She is sitting comfortably in bed this morning.  Objective: Temp:  [97.3 F (36.3 C)-98.2 F (36.8 C)] 97.6 F (36.4 C) (09/27 0825) Pulse Rate:  [68-94] 78 (09/27 0825) Resp:  [14-18] 18 (09/27 0825) BP: (90-116)/(59-82) 111/82 (09/27 0825) SpO2:  [92 %-99 %] 92 % (09/27 0825) Weight:  [62.2 kg] 62.2 kg (09/27 0600) Intake/Output 09/26 0701 - 09/27 0700 In: 637.4 [P.O.:480; IV Piggyback:157.4] Out: 600 [Urine:600]  Physical Exam:  Gen: NAD, alert, non-toxic, well-nourished, well-appearing, sitting comfortably  Skin: No obvious rashes, lesions or trauma. Warn and dry. HEENT: NCAT. Clear conjuctiva, no scleral icterus. No conjuctival pallor or injection. Moist mucous membranes. Neck: No masses or LAD. Soft. No JVD.  CV: No bilateral lower extremity edema Resp: Mild bibasilar crackles. no increased work of breathing Abd: NTND on palpation to all 4 quadrants.  Positive bowel sounds. Psych: Cooperative with exam. Pleasant. Makes eye contact. Extremities: Moves all extremities spontaneously    Laboratory: Recent Labs  Lab  07/14/18 1530 07/15/18 0325 07/16/18 0358  WBC 9.8 7.4 5.0  HGB 9.0* 8.4* 8.1*  HCT 31.0* 28.2* 27.4*  PLT 370 323 351   Recent Labs  Lab 07/14/18 1530 07/15/18 0325 07/16/18 0358  NA 140 139 135  K 3.5 3.3* 4.0  CL 106 106 100  CO2 23 24 26   BUN 8 8 9   CREATININE 0.69 0.60 0.61  CALCIUM 7.9* 7.7* 8.1*  PROT 6.3*  --   --   BILITOT 0.9  --   --   ALKPHOS 52  --   --   ALT 19  --   --   AST 35  --   --   GLUCOSE 140* 149* 176*   Future Labs:  CBC daily   Imaging/Diagnostic Tests: Dg Chest 2 View  Result Date: 07/14/2018 CLINICAL DATA:  Shortness of breath with cough, dizziness and lightheadedness. EXAM: CHEST - 2 VIEW COMPARISON:  06/12/2018 and CT chest 04/08/2018. FINDINGS: Trachea is midline. Pacemaker lead tips project over the right atrium, right ventricle and coronary sinus. Heart is enlarged. Mild diffuse mixed interstitial and airspace opacification appears slightly worsened from 06/12/2018. No definite pleural fluid. IMPRESSION: Suspect pulmonary edema superimposed on chronic interstitial lung disease. Electronically Signed   By: 04/10/2018 M.D.   On: 07/14/2018 16:40      Leanna Battles, MD 07/16/2018, 8:47 AM PGY-1, [place] [facility] FPTS Intern pager: [phone], text pages welcome

## 2018-07-16 NOTE — Care Management Note (Signed)
Case Management Note  Patient Details  Name: Makayla Vasquez MRN: [id number] Date of Birth: 02/15/1981  Subjective/Objective:  From home, presents with sepsis, acute/chronic HF, iron def anemia, ILD, prolonged QT syndrome, anti-synthetase syndrome, adrenal insuff bipolar.                   Action/Plan: NCM will follow for transition of care needs.   Expected Discharge Date:  07/15/18               Expected Discharge Plan:     In-House Referral:     Discharge planning Services  CM Consult  Post Acute Care Choice:    Choice offered to:     DME Arranged:    DME Agency:     HH Arranged:    HH Agency:     Status of Service:  In process, will continue to follow  If discussed at Long Length of Stay Meetings, dates discussed:    Additional Comments:  Leone Haven, RN 07/16/2018, 4:37 PM

## 2018-07-16 NOTE — Plan of Care (Signed)
Discussed plan of care for the evening with patient.  Patient is calm and cooperative.emphasized using the call button if assistance is needed.  Good teach back displayed.

## 2018-07-17 ENCOUNTER — Telehealth: Payer: Self-pay | Admitting: Family Medicine

## 2018-07-17 LAB — URINE CULTURE

## 2018-07-17 LAB — CBC
HEMATOCRIT: 27.2 % — AB (ref 36.0–46.0)
HEMOGLOBIN: 7.9 g/dL — AB (ref 12.0–15.0)
MCH: 24.1 pg — AB (ref 26.0–34.0)
MCHC: 29 g/dL — AB (ref 30.0–36.0)
MCV: 82.9 fL (ref 78.0–100.0)
Platelets: 341 10*3/uL (ref 150–400)
RBC: 3.28 MIL/uL — ABNORMAL LOW (ref 3.87–5.11)
RDW: 15.3 % (ref 11.5–15.5)
WBC: 7.3 10*3/uL (ref 4.0–10.5)

## 2018-07-17 LAB — BASIC METABOLIC PANEL
Anion gap: 7 (ref 5–15)
BUN: 11 mg/dL (ref 6–20)
CO2: 26 mmol/L (ref 22–32)
CREATININE: 0.72 mg/dL (ref 0.44–1.00)
Calcium: 8.6 mg/dL — ABNORMAL LOW (ref 8.9–10.3)
Chloride: 104 mmol/L (ref 98–111)
GFR calc Af Amer: >60 mL/min (ref >60)
GFR calc non Af Amer: >60 mL/min (ref >60)
Glucose, Bld: 256 mg/dL — ABNORMAL HIGH (ref 70–99)
Potassium: 4.1 mmol/L (ref 3.5–5.1)
Sodium: 137 mmol/L (ref 135–145)

## 2018-07-17 LAB — GLUCOSE, CAPILLARY
Glucose-Capillary: 268 mg/dL — ABNORMAL HIGH (ref 70–99)
Glucose-Capillary: 311 mg/dL — ABNORMAL HIGH (ref 70–99)

## 2018-07-17 MED ORDER — CEPHALEXIN 500 MG PO CAPS: 500.0000 mg | ORAL_CAPSULE | Freq: Two times a day (BID) | ORAL | 0 refills | Status: AC

## 2018-07-17 MED ORDER — CEPHALEXIN 500 MG PO CAPS
500.0000 mg | ORAL_CAPSULE | Freq: Two times a day (BID) | ORAL | Status: DC
  Administered 2018-07-17: 500 mg via ORAL
  Filled 2018-07-17: qty 1

## 2018-07-17 NOTE — Discharge Instructions (Signed)
It has been a pleasure taking care of you! You were admitted due to a bacterial urinary tract infection. We have treated you with antibiotics. With that your symptoms improved to the point we think it is safe to let you go home and follow up with your primary care doctor.   We are discharging you on antibiotic keflex 500mg  twice a day that you need to continue taking after you leave the hospital, your last dose will be on 07/20/18.  There could be some changes made to your home medications during this hospitalization. Please, make sure to read the directions before you take them. The names and directions on how to take these medications are found on this discharge paper under medication section.  Please follow up at your primary care doctor's office. The address, date and time are found on the discharge paper under follow up section.     Urinary Tract Infection, Adult A urinary tract infection (UTI) is an infection of any part of the urinary tract. The urinary tract includes the:  Kidneys.  Ureters.  Bladder.  Urethra.  These organs make, store, and get rid of pee (urine) in the body. Follow these instructions at home:  Take over-the-counter and prescription medicines only as told by your doctor.  If you were prescribed an antibiotic medicine, take it as told by your doctor. Do not stop taking the antibiotic even if you start to feel better.  Avoid the following drinks: ? Alcohol. ? Caffeine. ? Tea. ? Carbonated drinks.  Drink enough fluid to keep your pee clear or pale yellow.  Keep all follow-up visits as told by your doctor. This is important.  Make sure to: ? Empty your bladder often and completely. Do not to hold pee for long periods of time. ? Empty your bladder before and after sex. ? Wipe from front to back after a bowel movement if you are female. Use each tissue one time when you wipe. Contact a doctor if:  You have back pain.  You have a fever.  You feel  sick to your stomach (nauseous).  You throw up (vomit).  Your symptoms do not get better after 3 days.  Your symptoms go away and then come back. Get help right away if:  You have very bad back pain.  You have very bad lower belly (abdominal) pain.  You are throwing up and cannot keep down any medicines or water. This information is not intended to replace advice given to you by your health care provider. Make sure you discuss any questions you have with your health care provider. Document Released: 03/24/2008 Document Revised: 03/13/2016 Document Reviewed: 08/27/2015 Elsevier Interactive Patient Education  13/04/2015.

## 2018-07-17 NOTE — Progress Notes (Signed)
Family Medicine Teaching Service Daily Progress Note Intern Pager: [phone]  Patient name: Makayla Vasquez Medical record number: [id number] Date of birth: [birth year] Age: 37 y.o. Gender: female  Primary Care Provider: Wendee Beavers, DO Consultants: Cardiology Code Status: Full code  Pt Overview and Major Events to Date:  Admitted: 07/14/2018  Assessment and Plan: SIMONNE BOULOS a 37 y.o.femalepresenting with shortness of breath and presented septic. PMH is significant forlupus, interstitial lung disease on 2-4L home O2, congenital complete AV block, CHF, cardiomyopathy, T2DM, adrenal insufficiency, cor pulmonale, GERD, bipolar, prolonged QT syndrome.  #Sepsis Criteria, likely 2/2 to UTI Patient remains afebrile, with normal vital signs.  Patient's blood pressure is 130s/60s this AM 9/28.  RVP positive for rhinovirus.  Chest x-ray with chronic ILD and pulmonary edema, no pneumonia.  UA found to have bacteria.  Urine culture with Klebsiella. MRSA negative. Blood Cx NGTD. Urine culture show sensitivities to Cephalosporins.  Keflex 500 BID for 4 days 9/28 - 10/01 (total 7 day course)  Discontinued vancomycin.  d/c Zosyn  #Acute on chronic Systolic HF, Nonischemic cardiomyopathy. S/p medtronic CRT-P device On admission, BNP 259.  Echo 9/26 shows EF 55% with no wall motion abnormalities, grade 1 diastolic dysfunction.  She is status post Lasix IV 20 mg x2.  Her troponins trended flat at 0.04> 0.03> 0.03.  Patient reports that she is feeling back to baseline today.  Her most recent cardio outpatient follow-up, patient is on Lasix 20 mg daily.  She was recently stopped on Coreg and started on Toprol-XL 25 mg twice daily for better heart rate control.   Titrate nasal cannula as needed, keep patient saturations greater than 92%  #Iron deficiency anemia, chronic: Hgb 8.1 > 7.9 9/28. Patient has iron deficiency per anemia panel. Vitamin B12 is also low at 116.    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  Followed by rheumatology. On CellCept, tacrolimus, prednisone cream.  Today she feels that her breathing is back at baseline.  Titrate oxygen as neededto keep sats greater than 92%  #Congenital complete AV blockcardiomyopathy Follows with Dr. Shirlee Latch and cardiology she is status post pacer and has EP follow-up.  Metoprolol held in setting of hypotension. EKG shows prolonged QT syndrome -Hold metoprolol  #Prolong QT Syndrome  Avoid QT prolonging drugs  #T2DM: A1C 6.6%  Fasting glucose 268 9/28  Lantus 10 units nightly with sensitive sliding scale 3 times daily WC  #Adrenal insufficiency,chronic  stable  Monitor  #Lupus, chronic  stable Patient on tacrolimus for immunosuppression given lupus. Patient also reports joint pain throughout with her lupus. In the setting of possible sepsis consulted pharmacy who recommended holding tacrolimus during acute illness.  Start Tacrolimus at follow up appt. Will restart as soon his clinical improvement is seen  Continue Bentyl for muscle spasms.  Reports being on Bactrim for prophylaxis though she has not been taking this for the last few weeks  #GERD,chronicstable Patient does not appear to be on PPI at home  Consider starting PPI in setting of chronic prednisone use  #Bipolar,chronic stable  Patient not currently on any medications  E: replete PRN N: PO, Heart Healhty  GI KPT:WSFK, avoid zofran DVT ppx: Lovenox  Disposition:Admit to stepdown  Subjective:  Patient seen this morning resting in bed.  She states she feels much better but  is not quite back to her baseline with breathing.  She states she feels comfortable leaving the hospital and going home.  She denies headaches, chest pain, worsening shortness of  breath, fevers, and abdominal pain.   Current Facility-Administered Medications:  .  acetaminophen (TYLENOL) tablet 650 mg, 650 mg, Oral, Q6H PRN **OR** acetaminophen (TYLENOL) suppository 650 mg, 650 mg, Rectal, Q6H PRN, Talbert Forest, [place], DO .  cephALEXin (KEFLEX) capsule 500 mg, 500 mg, Oral, Q12H, Peggyann Shoals C, DO .  dicyclomine (BENTYL) tablet 20 mg, 20 mg, Oral, Q6H PRN, Shirley, [place], DO, 20 mg at 07/14/18 2211 .  enoxaparin (LOVENOX) injection 40 mg, 40 mg, Subcutaneous, Q24H, Shirley, [place], DO, 40 mg at 07/14/18 2211 .  guaiFENesin-dextromethorphan (ROBITUSSIN DM) 100-10 MG/5ML syrup 5 mL, 5 mL, Oral, Q4H PRN, Lum Babe, Kehinde T, MD, 5 mL at 07/17/18 0423 .  hydrocortisone sodium succinate (SOLU-CORTEF) 100 MG injection 50 mg, 50 mg, Intravenous, Q6H, Sandre Kitty, MD, 50 mg at 07/16/18 2345 .  insulin aspart (novoLOG) injection 0-9 Units, 0-9 Units, Subcutaneous, TID WC, Shirley, [place], DO, 2 Units at 07/16/18 0951 .  insulin glargine (LANTUS) injection 10 Units, 10 Units, Subcutaneous, Daily, Shirley, [place], DO, 10 Units at 07/16/18 0951 .  potassium chloride SA (K-DUR,KLOR-CON) CR tablet 40 mEq, 40 mEq, Oral, Daily, Talbert Forest, [place], DO, 40 mEq at 07/16/18 0951   Objective: Temp:  [97.6 F (36.4 C)-98.6 F (37 C)] 97.8 F (36.6 C) (09/28 0725) Pulse Rate:  [60-78] 60 (09/28 0725) Resp:  [18-20] 18 (09/28 0725) BP: (111-141)/(69-82) 141/78 (09/28 0725) SpO2:  [92 %-99 %] 97 % (09/28 0725) Weight:  [63.8 kg] 63.8 kg (09/28 0500)  Physical Exam  Constitutional: She is oriented to person, place, and time. She appears well-developed and well-nourished. She appears ill.  HENT:  Head: Normocephalic.  Eyes: EOM are normal.  Cardiovascular: Normal rate, regular rhythm and normal heart sounds.  Pulmonary/Chest: Effort normal and breath sounds normal.  Abdominal: Soft. Bowel sounds are normal. There is no tenderness.  Musculoskeletal: Normal range of motion.       Right  lower leg: Normal. She exhibits no edema.       Left lower leg: Normal. She exhibits no edema.  Neurological: She is alert and oriented to person, place, and time.  Skin: Skin is warm. Capillary refill takes less than 2 seconds.   Laboratory: Recent Labs  Lab 07/15/18 0325 07/16/18 0358 07/17/18 0217  WBC 7.4 5.0 7.3  HGB 8.4* 8.1* 7.9*  HCT 28.2* 27.4* 27.2*  PLT 323 351 341   Recent Labs  Lab 07/14/18 1530 07/15/18 0325 07/16/18 0358 07/17/18 0217  NA 140 139 135 137  K 3.5 3.3* 4.0 4.1  CL 106 106 100 104  CO2 23 24 26 26   BUN 8 8 9 11   CREATININE 0.69 0.60 0.61 0.72  CALCIUM 7.9* 7.7* 8.1* 8.6*  PROT 6.3*  --   --   --   BILITOT 0.9  --   --   --   ALKPHOS 52  --   --   --   ALT 19  --   --   --   AST 35  --   --   --   GLUCOSE 140* 149* 176* 256*   Imaging/Diagnostic Tests: Dg Chest 2 View  Result Date: 07/14/2018 CLINICAL DATA:  Shortness of breath with cough, dizziness and lightheadedness. EXAM: CHEST - 2 VIEW COMPARISON:  06/12/2018 and CT chest 04/08/2018. FINDINGS: Trachea is midline. Pacemaker  lead tips project over the right atrium, right ventricle and coronary sinus. Heart is enlarged. Mild diffuse mixed interstitial and airspace opacification appears slightly worsened from 06/12/2018. No definite pleural fluid. IMPRESSION: Suspect pulmonary edema superimposed on chronic interstitial lung disease. Electronically Signed   By: Leanna Battles M.D.   On: 07/14/2018 16:40     Dollene Cleveland, DO 07/17/2018, 8:10 AM PGY-1, [facility] FPTS Intern pager: [phone], text pages welcome

## 2018-07-17 NOTE — Plan of Care (Signed)
Discussed plan of care for the evening with patient.  Patient is feeling depressed because she is going to miss her son's football game tomorrow.  I suggested someone call from the game and allow her to listen or Facetime the game.  This RN encouraged patient to keep a positive attitude.  Patient is discouraged by the length of time she has been in the hospital.  Hopefully she will receive good news about being discharged soon.  Patient displayed good teach back.

## 2018-07-17 NOTE — Discharge Summary (Signed)
Family Medicine [facility] Discharge Summary  Patient name: Makayla Vasquez Medical record number: [id number] Date of birth: [birth year] Age: 37 y.o. Gender: female Date of Admission: 07/14/2018  Date of Discharge: 07/17/18 Admitting Physician: Doreene Eland, MD  Primary Care Provider: Wendee Beavers, DO Consultants: Cardiology (curbside)  Indication for Hospitalization: Sepsis  Discharge Diagnoses/Problem List:  Patient Active Problem List   Diagnosis Date Noted  . UTI due to Klebsiella species   . Sepsis (HCC) 07/14/2018  . Upper airway cough syndrome 06/18/2018  . Cor pulmonale, chronic (HCC) 04/08/2018  . Abnormal uterine bleeding 02/14/2018  . Dyspareunia, female 05/14/2017  . 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 On Cellcept therapy   . Systemic lupus erythematosus (HCC)   . Long term (current) use of systemic steroids   . Chronic diastolic congestive heart failure (HCC)   . Interstitial lung disease (HCC)   . Anemia   . Bipolar affective disorder in remission (HCC)   . Acute on chronic congestive heart failure (HCC)   . 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 Adrenal insufficiency (HCC)   . Antisynthetase syndrome (HCC)   . Idiopathic hypotension 07/08/2015  . Hyperlipidemia associated with type 2 diabetes mellitus (HCC) 05/04/2015  . Diabetes mellitus type 2 in obese (HCC)   . Chronic respiratory failure with hypoxia (HCC) 08/07/2014  . Acute respiratory failure with hypoxia (HCC) 06/19/2014  . ILD (interstitial lung disease) (HCC) 06/19/2014  . Congestive dilated cardiomyopathy (HCC) 05/31/2014  . Chronic systolic heart failure (HCC) 02/26/2012  . PPM-Medtronic   . Congenital complete AV block   . GERD (gastroesophageal reflux disease)   . 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 Polycystic ovaries 12/17/2006   Disposition: home  Discharge Condition: improved, stable  Discharge Exam:  Per Dr. Ewell Poe note on day of  discharge  Constitutional: She is oriented to person, place, and time. She appears well-developed and well-nourished. She appears ill.  HENT:  Head: Normocephalic.  Eyes: EOM are normal.  Cardiovascular: Normal rate, regular rhythm and normal heart sounds.  Pulmonary/Chest: Effort normal and breath sounds normal.  Abdominal: Soft. Bowel sounds are normal. There is no tenderness.  Musculoskeletal: Normal range of motion.       Right lower leg: Normal. She exhibits no edema.       Left lower leg: Normal. She exhibits no edema.  Neurological: She is alert and oriented to person, place, and time.  Skin: Skin is warm. Capillary refill takes less than 2 seconds.   Brief Hospital Course:  Makayla Vasquez is a 37 year old female with past medical history as listed above who presented with shortness of breath and found to have sepsis.  She reported that she had been having upper respiratory symptoms for 3 to 4 days PTA. Upon arrival to the ED, patient was febrile to 101.4, tachycardic, tachypneic and hypotensive to 89/56 (which patient later reports is her baseline blood pressure).  She was started on vancomycin and Zosyn.  Patient's urine culture was found to have gram-negative bacteria and patient was kept on Zosyn as she had a history of resistant enterococcus.  Ultimately culture had final growth with Klebsiella sensitive to Keflex.  She was discharged with p.o. Keflex twice daily through 10/1 to complete a 7-day course. Patient's admission was also significant for BNP of 259.  Her troponins trended flat.  She had a follow-up echo on 9/26 which showed ejection fraction 55% with no wall motion abnormalities and grade 1 diastolic dysfunction.  Patient was also found to have iron deficiency anemia and low vitamin B12.  Her tacrolimus and CellCept were held on admission, and should be restarted once the infection has cleared.  The remainder of the patient's chronic comorbidities were addressed and treated during  this admission.  No changes to medication were made.  Issues for Follow Up:  1. Breathing status after discharge. Any increases in O2 requirement.  2. Patient experienced a panic attack in the hospital and is interested in treatment for anxiety. 3. Restart tacrolimus and CellCept once infection has cleared (last day abx course 10/1) 4. Restart iron and B12 augmentation once infection has cleared. 5. Patient should take Keflex 500 mg twice daily from 9/28-10/1. Any UTI symptoms.  Significant Procedures: Echocardiogram Impressions: - When compared to the prior study from 11/07/2017 LVEF has   improved, now 55%. There is moderate mitral and tricuspid   regurgitation and moderate pulmonary hypertension. RVSP 60 mmHg.   Significant Labs and Imaging:  Recent Labs  Lab 07/15/18 0325 07/16/18 0358 07/17/18 0217  WBC 7.4 5.0 7.3  HGB 8.4* 8.1* 7.9*  HCT 28.2* 27.4* 27.2*  PLT 323 351 341   Recent Labs  Lab 07/14/18 1530 07/15/18 0325 07/16/18 0358 07/17/18 0217  NA 140 139 135 137  K 3.5 3.3* 4.0 4.1  CL 106 106 100 104  CO2 23 24 26 26   GLUCOSE 140* 149* 176* 256*  BUN 8 8 9 11   CREATININE 0.69 0.60 0.61 0.72  CALCIUM 7.9* 7.7* 8.1* 8.6*  ALKPHOS 52  --   --   --   AST 35  --   --   --   ALT 19  --   --   --   ALBUMIN 2.7*  --   --   --    Discharge Medications:  Allergies as of 07/17/2018      Reactions   Atorvastatin Other (See Comments)   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    cephALEXin 500 MG capsule Commonly known as:  KEFLEX Take 1 capsule (500 mg total) by mouth every 12 (twelve) hours for 4 days.   dicyclomine 20 MG tablet Commonly known as:  BENTYL Take 20 mg by mouth every 6 (six) hours.   EASY TOUCH PEN NEEDLES 31G X 8 MM Misc Generic drug:  Insulin Pen Needle USE AS DIRECTED   furosemide 20 MG tablet Commonly known as:  LASIX TAKE 1 TABLET BY MOUTH EVERY DAY   LANTUS SOLOSTAR 100 UNIT/ML Solostar Pen Generic drug:  Insulin Glargine INJECT 15 UNITS SUBCUTANEOUSLY EVERY DAY What changed:  See the new instructions.   metFORMIN 1000 MG tablet Commonly known as:  GLUCOPHAGE Take 1 tablet (1,000 mg total) by mouth 2 (two) times daily with a meal.   metoprolol succinate 25 MG 24 hr tablet Commonly known as:  TOPROL-XL Take 1 tablet (25 mg total) by mouth 2 (two) times daily.   mycophenolate 500 MG tablet  Commonly known as:  CELLCEPT Take 1,000 mg by mouth 2 (two) times daily.   NEXPLANON  Inject 1 application into the skin once.   NOVOLOG FLEXPEN 100 UNIT/ML FlexPen Generic drug:  insulin aspart INJECT 8 UNITS INTO THE SKIN 3 (THREE) TIMES DAILY WITH MEALS. What changed:  See the new instructions.   OXYGEN Inhale 4 L into the lungs.   potassium chloride SA 20 MEQ tablet Commonly known as:  K-DUR,KLOR-CON TAKE 2 TABLETS BY MOUTH ONCE DAILY   predniSONE 10 MG tablet Commonly known as:  DELTASONE Take 3 tabs (30 mg total) for 7 days, then 2 tabs ( 20 mg total) for 7 days, then 1 tab (10 mg total) for 7 days. What changed:    how much to take  how to take this  when to take this  additional instructions   tacrolimus 0.5 MG capsule Commonly known as:  PROGRAF Take 1.5 mg by mouth 2 (two) times daily.   traZODone 50 MG tablet Commonly known as:  DESYREL Take 1 tablet (50 mg total) by mouth at bedtime as needed for sleep.       Discharge Instructions: Please refer to Patient Instructions section of EMR for full  details.  Patient was counseled important signs and symptoms that should prompt return to medical care, changes in medications, dietary instructions, activity restrictions, and follow up appointments.   Follow-Up Appointments:   Future Appointments  Date Time Provider Department Center  07/21/2018  8:30 AM ACCESS TO CARE POOL [facility]  08/02/2018  9:30 AM [facility] PA/NP [facility] None  08/30/2018 10:00 AM Laurey Morale, MD [facility] None     Melene Plan, MD 07/17/2018, 9:50 AM PGY-1, [facility]

## 2018-07-17 NOTE — Telephone Encounter (Signed)
**  After Hours/ Emergency Line Call**  Received a call to report that Makayla Vasquez requesting callback to reach patient.  Patient's mother answered phone and stated patient was endorsing vomiting, hands turning blue, and back pain. Per mother patient has been SOB as well. Symptoms began around 30 min ago.  Denying chest pain.  Patient recently discharged this morning from hospital from what appears to be Sepsis with acute on chronic systolic HF. Recommended that patient come to emergency department as she has had continued shortness of breath.  Red flags discussed.  Will forward to PCP.  Oralia Manis, DO PGY-2, [facility] 07/17/2018 11:31 PM

## 2018-07-17 NOTE — Discharge Summary (Signed)
I will cosign resident's note once completed. Patient stable at discharge.

## 2018-07-19 ENCOUNTER — Telehealth: Payer: Self-pay | Admitting: Family Medicine

## 2018-07-19 LAB — CULTURE, BLOOD (ROUTINE X 2)
CULTURE: NO GROWTH
CULTURE: NO GROWTH

## 2018-07-19 LAB — GLUCOSE, CAPILLARY: Glucose-Capillary: 180 mg/dL — ABNORMAL HIGH (ref 70–99)

## 2018-07-19 NOTE — Telephone Encounter (Signed)
Contacted patient following after our call the day of discharge for sepsis secondary to urinary infection and acute on chronic heart failure.  Patient reporting vomiting and some increased shortness of breath which is now resolved.  She does have a mild nonproductive cough at times but is otherwise at her baseline per patient.  She has a hospital follow-up scheduled on 07/21/2018.  Red flags discussed.  Durward Parcel, DO [facility], PGY-3

## 2018-07-21 ENCOUNTER — Ambulatory Visit: Payer: Medicare Other

## 2018-07-21 DIAGNOSIS — F411 Generalized anxiety disorder: Secondary | ICD-10-CM | POA: Insufficient documentation

## 2018-07-21 NOTE — Progress Notes (Deleted)
   Subjective   Patient ID: Makayla Vasquez    DOB: 1981/08/22, 37 y.o. female   MRN: [id number]  CC: "***"  HPI: Makayla Vasquez is a 37 y.o. female who presents to clinic today for the following:  ***: ***  ***Patient is presenting today for hospital follow-up she presented to [facility] ED with SOB meeting sepsis.  Urine culture revealed gram-negative bacteria system with Klebsiella sensitive to Keflex.  She did undergo a TTE on 9/26 showing EF 55% with no wall motion abnormalities and G1DD.  Patient completed course of Keflex yesterday.  She remains asymptomatic and denies fevers or chills, dysuria, urinary frequency urgency, flank pain.  ROS: see HPI for pertinent.  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  Surgical history pacemaker and ablation, cholecystectomy, C-section, nasal, thyroglossal ductal cyst.  Family history heart disease, lupus, HTN. Smoking status reviewed. Medications reviewed.  Objective   There were no vitals taken for this visit. Vitals and nursing note reviewed.  General: well nourished, well developed, NAD with non-toxic appearance HEENT: normocephalic, atraumatic, moist mucous membranes Neck: supple, non-tender without lymphadenopathy Cardiovascular: regular rate and rhythm without murmurs, rubs, or gallops Lungs: clear to auscultation bilaterally with normal work of breathing Abdomen: soft, non-tender, non-distended, normoactive bowel sounds Skin: warm, dry, no rashes or lesions, cap refill < 2 seconds Extremities: warm and well perfused, normal tone, no edema  Assessment & Plan   No problem-specific Assessment & Plan notes found for this encounter.  No orders of the defined types were placed in this encounter.  No orders of the defined types were placed in this encounter.   Durward Parcel, DO [facility], PGY-3 07/21/2018, 8:08 AM

## 2018-07-22 ENCOUNTER — Ambulatory Visit: Payer: Medicare Other | Admitting: Family Medicine

## 2018-07-22 NOTE — Progress Notes (Deleted)
   Subjective:   Patient ID: Makayla Vasquez    DOB: 09-05-1981, 37 y.o. female   MRN: [id number]  CC: ***  HPI: Makayla Vasquez is a 37 y.o. female who presents to clinic today ***. Problems discussed today are as follows:  ROS: See HPI for pertinent ROS.  PMFSH: Pertinent past medical, surgical, family, and social history were reviewed and updated as appropriate. Smoking status reviewed. Medications reviewed. Objective:   There were no vitals taken for this visit. Vitals and nursing note reviewed.  General: well nourished, well developed, in no acute distress with non-toxic appearance HEENT: NCAT, EOMI, PERRL, MMM, oropharynx clear Neck: supple, non-tender, normal ROM, no LAD  CV: RRR no MRG  Lungs: CTAB, normal effort  Abdomen: soft, NTND, no masses or organomegaly, +bs  Skin: warm, dry, no rashes or lesions, cap refill < 2 seconds Extremities: warm and well perfused, normal tone Neuro: alert, oriented x3, no focal deficits   Assessment & Plan:   No problem-specific Assessment & Plan notes found for this encounter.  No orders of the defined types were placed in this encounter.  No orders of the defined types were placed in this encounter.   Freddrick March, MD [facility], PGY-3 07/22/2018 10:47 AM

## 2018-07-24 ENCOUNTER — Emergency Department (HOSPITAL_COMMUNITY): Payer: Medicare Other

## 2018-07-24 ENCOUNTER — Inpatient Hospital Stay (HOSPITAL_COMMUNITY)
Admission: EM | Admit: 2018-07-24 | Discharge: 2018-07-26 | DRG: 602 | Disposition: A | Discharge status: Home / Self Care | Payer: Medicare Other | Attending: Family Medicine | Admitting: Family Medicine

## 2018-07-24 ENCOUNTER — Other Ambulatory Visit: Payer: Self-pay

## 2018-07-24 ENCOUNTER — Encounter (HOSPITAL_COMMUNITY): Payer: Self-pay

## 2018-07-24 DIAGNOSIS — Z9981 Dependence on supplemental oxygen: Secondary | ICD-10-CM

## 2018-07-24 DIAGNOSIS — E872 Acidosis: Secondary | ICD-10-CM | POA: Diagnosis present

## 2018-07-24 DIAGNOSIS — L03113 Cellulitis of right upper limb: Secondary | ICD-10-CM

## 2018-07-24 DIAGNOSIS — E1169 Type 2 diabetes mellitus with other specified complication: Secondary | ICD-10-CM | POA: Diagnosis present

## 2018-07-24 DIAGNOSIS — Z794 Long term (current) use of insulin: Secondary | ICD-10-CM | POA: Diagnosis not present

## 2018-07-24 DIAGNOSIS — F3170 Bipolar disorder, currently in remission, most recent episode unspecified: Secondary | ICD-10-CM | POA: Diagnosis present

## 2018-07-24 DIAGNOSIS — I2781 Cor pulmonale (chronic): Secondary | ICD-10-CM | POA: Diagnosis present

## 2018-07-24 DIAGNOSIS — Z9049 Acquired absence of other specified parts of digestive tract: Secondary | ICD-10-CM

## 2018-07-24 DIAGNOSIS — Z8249 Family history of ischemic heart disease and other diseases of the circulatory system: Secondary | ICD-10-CM | POA: Diagnosis not present

## 2018-07-24 DIAGNOSIS — E46 Unspecified protein-calorie malnutrition: Secondary | ICD-10-CM | POA: Diagnosis present

## 2018-07-24 DIAGNOSIS — M25531 Pain in right wrist: Secondary | ICD-10-CM

## 2018-07-24 DIAGNOSIS — M069 Rheumatoid arthritis, unspecified: Secondary | ICD-10-CM | POA: Diagnosis present

## 2018-07-24 DIAGNOSIS — D649 Anemia, unspecified: Secondary | ICD-10-CM | POA: Diagnosis present

## 2018-07-24 DIAGNOSIS — E2740 Unspecified adrenocortical insufficiency: Secondary | ICD-10-CM | POA: Diagnosis present

## 2018-07-24 DIAGNOSIS — K219 Gastro-esophageal reflux disease without esophagitis: Secondary | ICD-10-CM | POA: Diagnosis present

## 2018-07-24 DIAGNOSIS — I420 Dilated cardiomyopathy: Secondary | ICD-10-CM | POA: Diagnosis present

## 2018-07-24 DIAGNOSIS — J45909 Unspecified asthma, uncomplicated: Secondary | ICD-10-CM | POA: Diagnosis present

## 2018-07-24 DIAGNOSIS — Z888 Allergy status to other drugs, medicaments and biological substances status: Secondary | ICD-10-CM

## 2018-07-24 DIAGNOSIS — Q246 Congenital heart block: Secondary | ICD-10-CM

## 2018-07-24 DIAGNOSIS — Z7952 Long term (current) use of systemic steroids: Secondary | ICD-10-CM

## 2018-07-24 DIAGNOSIS — I2729 Other secondary pulmonary hypertension: Secondary | ICD-10-CM | POA: Diagnosis present

## 2018-07-24 DIAGNOSIS — I081 Rheumatic disorders of both mitral and tricuspid valves: Secondary | ICD-10-CM | POA: Diagnosis present

## 2018-07-24 DIAGNOSIS — Z950 Presence of cardiac pacemaker: Secondary | ICD-10-CM | POA: Diagnosis not present

## 2018-07-24 DIAGNOSIS — E861 Hypovolemia: Secondary | ICD-10-CM | POA: Diagnosis present

## 2018-07-24 DIAGNOSIS — I5043 Acute on chronic combined systolic (congestive) and diastolic (congestive) heart failure: Secondary | ICD-10-CM | POA: Diagnosis present

## 2018-07-24 DIAGNOSIS — J9611 Chronic respiratory failure with hypoxia: Secondary | ICD-10-CM | POA: Diagnosis present

## 2018-07-24 DIAGNOSIS — Z832 Family history of diseases of the blood and blood-forming organs and certain disorders involving the immune mechanism: Secondary | ICD-10-CM

## 2018-07-24 DIAGNOSIS — E785 Hyperlipidemia, unspecified: Secondary | ICD-10-CM | POA: Diagnosis present

## 2018-07-24 DIAGNOSIS — M329 Systemic lupus erythematosus, unspecified: Secondary | ICD-10-CM | POA: Diagnosis present

## 2018-07-24 DIAGNOSIS — Z7951 Long term (current) use of inhaled steroids: Secondary | ICD-10-CM

## 2018-07-24 DIAGNOSIS — M79601 Pain in right arm: Secondary | ICD-10-CM | POA: Diagnosis not present

## 2018-07-24 DIAGNOSIS — IMO0002 Reserved for concepts with insufficient information to code with codable children: Secondary | ICD-10-CM

## 2018-07-24 DIAGNOSIS — M25539 Pain in unspecified wrist: Secondary | ICD-10-CM

## 2018-07-24 DIAGNOSIS — R05 Cough: Secondary | ICD-10-CM | POA: Diagnosis not present

## 2018-07-24 DIAGNOSIS — R0602 Shortness of breath: Secondary | ICD-10-CM | POA: Diagnosis not present

## 2018-07-24 DIAGNOSIS — Z87891 Personal history of nicotine dependence: Secondary | ICD-10-CM

## 2018-07-24 DIAGNOSIS — Z79899 Other long term (current) drug therapy: Secondary | ICD-10-CM

## 2018-07-24 DIAGNOSIS — Z91048 Other nonmedicinal substance allergy status: Secondary | ICD-10-CM

## 2018-07-24 DIAGNOSIS — F411 Generalized anxiety disorder: Secondary | ICD-10-CM | POA: Diagnosis present

## 2018-07-24 DIAGNOSIS — Z6823 Body mass index (BMI) 23.0-23.9, adult: Secondary | ICD-10-CM

## 2018-07-24 LAB — COMPREHENSIVE METABOLIC PANEL
ALBUMIN: 3.2 g/dL — AB (ref 3.5–5.0)
ALT: 26 U/L (ref 0–44)
ANION GAP: 8 (ref 5–15)
AST: 31 U/L (ref 15–41)
Alkaline Phosphatase: 48 U/L (ref 38–126)
BILIRUBIN TOTAL: 0.8 mg/dL (ref 0.3–1.2)
BUN: 7 mg/dL (ref 6–20)
CHLORIDE: 107 mmol/L (ref 98–111)
CO2: 25 mmol/L (ref 22–32)
Calcium: 9 mg/dL (ref 8.9–10.3)
Creatinine, Ser: 0.61 mg/dL (ref 0.44–1.00)
GFR calc Af Amer: >60 mL/min (ref >60)
GFR calc non Af Amer: >60 mL/min (ref >60)
Glucose, Bld: 123 mg/dL — ABNORMAL HIGH (ref 70–99)
POTASSIUM: 3.6 mmol/L (ref 3.5–5.1)
SODIUM: 140 mmol/L (ref 135–145)
TOTAL PROTEIN: 7.5 g/dL (ref 6.5–8.1)

## 2018-07-24 LAB — URINALYSIS, ROUTINE W REFLEX MICROSCOPIC
Bilirubin Urine: NEGATIVE
Glucose, UA: NEGATIVE mg/dL
HGB URINE DIPSTICK: NEGATIVE
Ketones, ur: NEGATIVE mg/dL
Leukocytes, UA: NEGATIVE
NITRITE: NEGATIVE
Protein, ur: NEGATIVE mg/dL
SPECIFIC GRAVITY, URINE: 1.025 (ref 1.005–1.030)
pH: 6 (ref 5.0–8.0)

## 2018-07-24 LAB — CBC WITH DIFFERENTIAL/PLATELET
ABS IMMATURE GRANULOCYTES: 0 10*3/uL (ref 0.0–0.1)
BASOS ABS: 0.1 10*3/uL (ref 0.0–0.1)
Basophils Relative: 1 %
Eosinophils Absolute: 0.3 10*3/uL (ref 0.0–0.7)
Eosinophils Relative: 3 %
HEMATOCRIT: 32.4 % — AB (ref 36.0–46.0)
HEMOGLOBIN: 9.3 g/dL — AB (ref 12.0–15.0)
Immature Granulocytes: 0 %
LYMPHS ABS: 1 10*3/uL (ref 0.7–4.0)
LYMPHS PCT: 10 %
MCH: 23.8 pg — AB (ref 26.0–34.0)
MCHC: 28.7 g/dL — ABNORMAL LOW (ref 30.0–36.0)
MCV: 83.1 fL (ref 78.0–100.0)
Monocytes Absolute: 0.7 10*3/uL (ref 0.1–1.0)
Monocytes Relative: 7 %
NEUTROS ABS: 7.6 10*3/uL (ref 1.7–7.7)
Neutrophils Relative %: 79 %
Platelets: 461 10*3/uL — ABNORMAL HIGH (ref 150–400)
RBC: 3.9 MIL/uL (ref 3.87–5.11)
RDW: 16.2 % — ABNORMAL HIGH (ref 11.5–15.5)
WBC: 9.6 10*3/uL (ref 4.0–10.5)

## 2018-07-24 LAB — I-STAT CG4 LACTIC ACID, ED
LACTIC ACID, VENOUS: 2.28 mmol/L — AB (ref 0.5–1.9)
LACTIC ACID, VENOUS: 0.82 mmol/L (ref 0.5–1.9)

## 2018-07-24 LAB — GLUCOSE, CAPILLARY: GLUCOSE-CAPILLARY: 104 mg/dL — AB (ref 70–99)

## 2018-07-24 LAB — I-STAT BETA HCG BLOOD, ED (MC, WL, AP ONLY): I-stat hCG, quantitative: <5 m[IU]/mL (ref <5)

## 2018-07-24 LAB — TROPONIN I: Troponin I: 0.03 ng/mL (ref <0.03)

## 2018-07-24 MED ORDER — INSULIN ASPART 100 UNIT/ML ~~LOC~~ SOLN
0.0000 [IU] | Freq: Three times a day (TID) | SUBCUTANEOUS | Status: DC
  Administered 2018-07-25 (×2): 2 [IU] via SUBCUTANEOUS
  Administered 2018-07-26: 1 [IU] via SUBCUTANEOUS

## 2018-07-24 MED ORDER — ACETAMINOPHEN 325 MG PO TABS
650.0000 mg | ORAL_TABLET | Freq: Once | ORAL | Status: AC
  Administered 2018-07-24: 650 mg via ORAL
  Filled 2018-07-24: qty 2

## 2018-07-24 MED ORDER — ENSURE ENLIVE PO LIQD
237.0000 mL | Freq: Two times a day (BID) | ORAL | Status: DC
  Administered 2018-07-25 – 2018-07-26 (×2): 237 mL via ORAL

## 2018-07-24 MED ORDER — SODIUM CHLORIDE 0.9 % IV SOLN
INTRAVENOUS | Status: AC
  Administered 2018-07-24: 23:00:00 via INTRAVENOUS

## 2018-07-24 MED ORDER — ACETAMINOPHEN 325 MG PO TABS
650.0000 mg | ORAL_TABLET | Freq: Four times a day (QID) | ORAL | Status: DC | PRN
  Administered 2018-07-25 (×3): 650 mg via ORAL
  Filled 2018-07-24 (×3): qty 2

## 2018-07-24 MED ORDER — TRAZODONE HCL 50 MG PO TABS
50.0000 mg | ORAL_TABLET | Freq: Every evening | ORAL | Status: DC | PRN
  Administered 2018-07-24: 50 mg via ORAL
  Filled 2018-07-24: qty 1

## 2018-07-24 MED ORDER — ENOXAPARIN SODIUM 30 MG/0.3ML ~~LOC~~ SOLN
30.0000 mg | SUBCUTANEOUS | Status: DC
  Administered 2018-07-25: 30 mg via SUBCUTANEOUS
  Filled 2018-07-24: qty 0.3

## 2018-07-24 MED ORDER — VANCOMYCIN HCL IN DEXTROSE 1-5 GM/200ML-% IV SOLN
1000.0000 mg | Freq: Two times a day (BID) | INTRAVENOUS | Status: DC
  Administered 2018-07-24: 1000 mg via INTRAVENOUS
  Filled 2018-07-24: qty 200

## 2018-07-24 MED ORDER — ACETAMINOPHEN 650 MG RE SUPP: 650.0000 mg | Freq: Four times a day (QID) | RECTAL | Status: DC | PRN

## 2018-07-24 NOTE — ED Notes (Signed)
Admitting, MD at bedside.  

## 2018-07-24 NOTE — ED Notes (Signed)
Bero, MD at bedside.  

## 2018-07-24 NOTE — H&P (Signed)
[facility] Admission History and Physical Service Pager: [phone]  Patient name: Makayla Vasquez Medical record number: [id number] Date of birth: 1981/09/21 Age: 37 y.o. Gender: female  Primary Care Provider: Pioneer Bing, DO Consultants: None Code Status: FULL   Chief Complaint: Right wrist and hand pain radiating up her arm, cough  Assessment and Plan: [name] is a 37 y.o. 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  #Right hand and wrist pain, subacute, worsening Patient presents today with right hand and wrist pain radiating up her arm.  Patient reports that she has had the symptoms for the past 2 weeks.  Patient reports that symptoms or present at last admission on 9/25.  She reports that pain and redness are worsening the past few days.  Patient denies taking any medication for pain control.  She just completed a course of Keflex for urinary tract infection.  Patient also endorses nausea and intermittent vomiting.  She also had diarrhea 4 episodes daily for the past 2 days.  Both nausea and diarrhea seems to be chronic for the past 2 to 3 weeks.  On admission to the ED there is mild erythema noted on her right wrist lateral arm and forearm.However, pain is mostly located to her right wrist.  Patient has good range of motion at the shoulder level and is not tender to palpation throughout her arm but has limited range of motion due to pain at her wrist.  No swelling noted.  On admission patient was febrile with a temperature of 102.0 F with associated tachycardia HR 133 and a lactic acid 2.28.  Initial blood pressure was 118/86.  Patient did not have  any tachypnea. Given redness and pain to her right wrist and arm patient was considered septic and was started on vancomycin.  She did not have any leukocytosis WBC 9.6.  Chest x-ray was negative for any acute pulmonary process and did not show any signs of volume overload.  Lactic acid resolved quickly with IV fluid, likely due to decreased p.o. intake in the past few days with patient reporting decreased fluid intake. She is also on lasix 20 mg daily. Upon further examination of her wrist and hand minimal suspicion for cellulitis, findings are more consistent with possible inflammatory process in the wrist and hand given patient history of autoimmune disorder. Could also consider possible septic joint though patient did not have any swelling and pain has significantly improved with Tylenol.  Patient also treated recently for Klebsiella UTI and just recently completed antibiotic course. This could be another possible source of infection explaining sepsis picture on admission. qSOFA score 0.  Based on my assessment there is not an obvious source of infection, quickly resolving the acid and normal WBC with normal BP and RR makes it make it less likely to be having active ongoing infectious process requiring antibiotic. Increase HR could be secondary to mild hypovolemia and not taking beta blocker today. Will discontinue vancomycin and follow-up on cultures.  Low threshold to restart if patient decompensates.  Patient also already received 1 dose.  If cellulitis suspected given lack of purulence would probably use cefazolin unless there is concern for MRSA given patient recent hospitalization. --Admit  to FMTS, admitting physician Dr. Erin Hearing --Admit to telemetry --Discontinue vancomycin --Follow-up on blood and urine cultures (urine collected after started vancomycin) --Follow-up on a.m. CBC and BMP --Order ESR and CRP --Acetaminophen 650 mg every 6 as needed --Monitor vitals  --NS 100 cc/hr for 12  hours  #Cough, chronic, not improving Patient was recently admitted and had a RVP which showed Rhinovirus. She reports cough has not improved much since discharge from hospital a week ago (9/28). She has increased her O2 to 5L at home. Normally patient is between 2-4L. CXR did not show any signs of vascular congestion, lung exam without crackles and no LE edema. Patient does also have ILD with poor lung function. Cough symptoms reported which based on chart review have been going on for about 2 months are likely secondary current viral infection in the setting of likely worsening lung function with possible progression of her ILD. On my exam, no crackles, wheezing or rhonchi and patient is satting 98% on her home 4L. Echocardiogram on 9/26 did also show RSVP 60 mm Hg which is very likely contributing given RVSP was 39 mm Hg.  --Continue to monitor respiratory status. --Will hold off on breathing treatment for now but would consider if acutely worsened --Resume prednisone after r/o infx   Acute on chronic HFpEF, Patient had an echo during he recent admission which showed an EF 55% with moderate mitral and tricuspid regurgitation and increase RSVP 60 mm Hg. No clinical evidence of volume overload on admission. CXR was also negative for vascular congestion. Trop 0.03. Patient appeared dry on exam and has had diarrhea and vomiting the past few days. Lactic acidosis on admission mostly from decrease volume given quick resolution with IVF bolus.  --NS @ 100 cc/hr  For 12 hours -- daily weights --Strict I/o --Hold lasix and reassess volume status --Trend troponins  #Interstitial lung disease on 2-4L home O2  Pulmonary HTN  Cor pulmonale: Patient with significant lung disease.  Reports that she is on 7.5 mg prednisone daily as well as 2 to 4 L of home oxygen.  Patient reports that she uses 4 L at night when sleeping and during the day usually uses 2 L but will sometimes require 4. Patient has been  requiring 5L in the past few days she mostly attribute that increase to the cough. Patient does not have any inhalers at home.  Shortness of breath worse upon laying per patient. --Monitor respiratory status --Consider stress dose steroids --Patient will to follow up with pulm  #Congenital complete AV block  prolonged QT syndrome   cardiomyopathy:  EKG showed paced rhythm. Trop 0.03. No chest pain reported. --Will admit to telemetry --Avoid QT prolonging drugs --Holding metoprolol in setting of mild hypotension. will likely resume tomorrow once volume status improved tachycardia has improved <100 with IVF.  #Anemia, chronic, improving Anemia panel during previous admission 10 days ago showed evidence of B12 and iron deficiency. Baseline hemoglobin ~10, on admission today 9.3 much improved since discharged a week ago (9/28) 7.9.  --Follow up on CBC --Will continue B12 and iron supplementation  #T2DM, well controlled Last hemoglobin A1c 6.6 on 07/16/2018.  Patient on Lantus 15 units and NovoLog 8 units 3 times daily with meals.  Patient reports compliance. On admission, BG is 123. Patient reports most of her BG at home have been in the low 100's. Given possible infection, will not be aggressive in our glycemic control --Start patient on sensitive SSI  --Resume home regimen once clinically  appropriate  #Adrenal insufficiency Patient on 7.5 mg prednisone daily.  Consider stress to steroids if patient does not improve.   #Lupus Patient on tacrolimus and cellcept for immunosuppression given lupus.  Patient also reports joint pain throughout with her lupus.Will hold tacrolimus in the setting of possible infection --Restart tacrolimus, cellcept when clinically appropriate  --Continue Bentyl for muscle spasms  #Protein calorie malnutrition Albumin on admission 3.2 likely in the setting of poor po intake in the past few days.  #GERD, chronic, stable:  -Monitor -Patient does not appear to be  on PPI, consider starting PPI in setting of chronic prednisone use  #Bipolar, stable: -Patient not currently on any medications  FEN/GI: Heart healthy/ Carb modified Prophylaxis: Lovenox  Disposition: Admit to Telemetry  History of Present Illness:  Makayla Vasquez is a 37 y.o. female with a past medical history significant for Interstitial lung disease on 2-4L home O2, congenital complete AV block, CHF, cardiomyopathy, T2DM, adrenal insufficiency, lupus, cor pulmonale, GERD, bipolar, prolonged QT syndrome who presented today complaining of right wrist and hand pain as well as cough. Patient reports since discharge from the hospital she has not felt well. She continue to be short of breath requiring increase oxygen on 5L from 4L. She also had intermittent nausea and non bloody non bilious emesis. For the past few days. She reports pain in her wrist and redness were present during her previous admission but has worsened in the past days. She has not taken any medications to control her pain. She has limited range of motion at the wrist and is tender to palpation. She reports that pain shoots up her arm to her shoulder. She also just completed her course of keflex for UTI yesterday ( took last pill yesterday after she lost the bottle, but course completed on 10/1 as scheduled on discharge). Patient also reports diarrhea for the past two days she reports about 4 episodes daily. She has had poor po intake due to lack of appetite. She denies any fever or chills, chest pain, dizziness. In the ED, patient received IVF bolus and was started on vancomycin after she was found to be febrile to 102 F, tachycardic with a lactic acid of 2.8. WBC 9.6. LA quickly trended dow to 0.82. Trop was 0.03 and EKG was unremarkable. Patient was not toxic appearing. Teaching service was consulted for admission for questionable right arm cellulitis.  Review Of Systems: Per HPI with the following additions:   Review of Systems   Constitutional: Negative for fever.  HENT: Positive for congestion.   Eyes: Negative for blurred vision and double vision.  Respiratory: Positive for cough and shortness of breath. Negative for sputum production.   Cardiovascular: Negative for chest pain and leg swelling.  Gastrointestinal: Positive for diarrhea, nausea and vomiting. Negative for blood in stool and constipation.  Genitourinary: Negative for dysuria and frequency.  Musculoskeletal: Positive for joint pain and myalgias.  Skin: Negative for rash.  Endo/Heme/Allergies: Positive for polydipsia.    Patient Active Problem List   Diagnosis Date Noted  . Right arm pain 07/24/2018  . Anxiety state 07/21/2018  . UTI due to Klebsiella species   . Sepsis due to Klebsiella ([place]) 07/14/2018  . Upper airway cough syndrome 06/18/2018  . Cor pulmonale, chronic ([place]) 04/08/2018  . Abnormal uterine bleeding 02/14/2018  . Dyspareunia, female 05/14/2017  . 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 On Cellcept therapy   . Systemic lupus erythematosus ([place])   . Long term (current) use of  systemic steroids   . Chronic diastolic congestive heart failure ([place])   . Interstitial lung disease ([place])   . Anemia   . Bipolar affective disorder in remission ([place])   . Acute on chronic congestive heart failure ([place])   . 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 Adrenal insufficiency ([place])   . 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 Idiopathic hypotension 07/08/2015  . Hyperlipidemia associated with type 2 diabetes mellitus ([place]) 05/04/2015  . Diabetes mellitus type 2 in obese ([place])   . Chronic respiratory failure with hypoxia ([place]) 08/07/2014  . Acute respiratory failure with hypoxia ([place]) 06/19/2014  . ILD (interstitial lung disease) ([place]) 06/19/2014  . Congestive dilated cardiomyopathy ([place]) 05/31/2014  . Chronic systolic heart failure ([place]) 02/26/2012  . PPM-Medtronic   . Congenital complete AV block   . GERD (gastroesophageal  reflux disease)   . 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 Polycystic ovaries 12/17/2006    Past Medical History: Past Medical History:  Diagnosis Date  . Anxiety   . Arthritis    rheumatoid arthritis- mild, no rheumatology care   . Asthma    seasonal allergies   . Asymptomatic LV dysfunction    a. Echo in Dec 2011 with EF 35 to 40%. Felt to be due to paced rhythm. b. EF 25-30% in 07/2014.  . Bipolar affective disorder ([place])   . Cardiac pacemaker    a. Since age 11 in 81. b. Upgrade to BiV in 2013.  [name] Carpal tunnel syndrome of right wrist   . CHF (congestive heart failure) ([place])   . Congenital complete AV block   . Cor pulmonale, chronic ([place]) 04/08/2018  . Depression    bipolar  . Diabetes mellitus without complication ([place])   . GERD (gastroesophageal reflux disease)   . Hypertension   . Interstitial lung disease ([place])   . Lupus ([place])   . Lupus (systemic lupus erythematosus) ([place])   . Obesity   . Pneumonitis    a. a/w hypoxia - inflammatory - large workup 07/2014.  . Presence of permanent cardiac pacemaker   . Seizures ([place])    as a child- from high fever  . Sinus tachycardia     Past Surgical History: Past Surgical History:  Procedure Laterality Date  . ATRIAL TACH ABLATION N/A 08/14/2014   Procedure: ATRIAL TACH ABLATION;  Surgeon: [name], MD;  Location: [facility] CATH LAB;  Service: Cardiovascular;  Laterality: N/A;  . BI-VENTRICULAR PACEMAKER UPGRADE N/A 03/08/2012   Procedure: BI-VENTRICULAR PACEMAKER UPGRADE;  Surgeon: [name], MD;  Location: [facility] CATH LAB;  Service: Cardiovascular;  Laterality: N/A;  . CARPAL TUNNEL WITH CUBITAL TUNNEL Right 07/26/2013   Procedure: RIGHT LIMITED OPEN CARPAL TUNNEL RELEASE ,  RIGHT CUBITAL TUNNEL RELEASE, INSITU VERSES ULNAR NERVE DECOMPRESSION AND ANTERIOR TRANSPOSITION;  Surgeon: [name], MD;  Location: [facility];  Service: Orthopedics;  Laterality: Right;  . CESAREAN SECTION    . CHOLECYSTECTOMY    . CYST EXCISION   12/10/2012   THYROID  . INSERT / REPLACE / REMOVE PACEMAKER     2001  . IUD REMOVAL  11/03/2011   Procedure: INTRAUTERINE DEVICE (IUD) REMOVAL;  Surgeon: [name], MD;  Location: [facility];  Service: Gynecology;  Laterality: N/A;  . NASAL FRACTURE SURGERY     /w plate   . RIGHT HEART CATHETERIZATION N/A 10/26/2014   Procedure: RIGHT HEART CATH;  Surgeon: [name], MD;  Location: [facility] CATH LAB;  Service: Cardiovascular;  Laterality: N/A;  . THROAT SURGERY  1994   s/p laser treatment  . THYROGLOSSAL DUCT CYST N/A 12/10/2012   Procedure: REVISION OF THYROGLOSSAL DUCT CYST EXCISION;  Surgeon: [name], MD;  Location: [facility];  Service: ENT;  Laterality: N/A;  Revision of Thyroglossal Duct Cyst Excision  . VIDEO BRONCHOSCOPY Bilateral 06/19/2014   Procedure: VIDEO BRONCHOSCOPY WITHOUT FLUORO;  Surgeon: Brand Males, MD;  Location: [facility];  Service: Cardiopulmonary;  Laterality: Bilateral;    Social History: Social History   Tobacco Use  . Smoking status: Former Smoker    Packs/day: 0.25    Years: 0.50    Pack years: 0.12    Types: Cigarettes    Last attempt to quit: 07/25/1996    Years since quitting: 22.0  . Smokeless tobacco: Never Used  Substance Use Topics  . Alcohol use: No  . Drug use: No   Additional social history: Former smoker, reports drinking alcoholic beverage 1 time per year and denies illicit drug use Please also refer to relevant sections of EMR.  Family History: Family History  Problem Relation Age of Onset  . Heart disease Mother        CHF (no details)  . Hypertension Mother   . Lupus Mother   . Heart disease Father        Murmur  . Heart disease Sister 74        No details.  History of a pacemaker    Allergies and Medications: Allergies  Allergen Reactions  . 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 Sertraline Hcl Hives  . Tape Other (See Comments)    Burns skin    No current facility-administered medications on file prior to encounter.    Current Outpatient Medications on File Prior to Encounter  Medication Sig Dispense Refill  . dicyclomine (BENTYL) 20 MG tablet Take 20 mg by mouth 4 (four) times daily -  before meals and at bedtime.     . Etonogestrel (NEXPLANON [place]) Inject 1 application into the skin once.     . furosemide (LASIX) 20 MG tablet TAKE 1 TABLET BY MOUTH EVERY DAY (Patient taking differently: Take 20 mg by mouth daily. ) 90 tablet 3  . LANTUS SOLOSTAR 100 UNIT/ML Solostar Pen INJECT 15 UNITS SUBCUTANEOUSLY EVERY DAY (Patient taking differently: Inject 15 Units into the skin daily as needed (High blood sugar). ) 15 pen 2  . metFORMIN (GLUCOPHAGE) 1000 MG tablet Take 1 tablet (1,000 mg total) by mouth 2 (two) times daily with a meal. 90 tablet 3  . metoprolol succinate (TOPROL XL) 25 MG 24 hr tablet Take 1 tablet (25 mg total) by mouth 2 (two) times daily. (Patient taking differently: Take 25 mg by mouth daily. ) 60 tablet 11  . mycophenolate (CELLCEPT) 500 MG tablet Take 1,000 mg by mouth 2 (two) times daily.     [name] NOVOLOG FLEXPEN 100 UNIT/ML FlexPen INJECT 8 UNITS INTO THE SKIN 3 (THREE) TIMES DAILY WITH MEALS. (Patient taking differently: Inject 8 Units into the skin 3 (three) times daily with meals. ) 15 pen 5  . OXYGEN Inhale 2-4 L into the lungs.     . potassium chloride SA (K-DUR,KLOR-CON) 20 MEQ tablet TAKE 2 TABLETS BY MOUTH ONCE DAILY (Patient taking differently: Take 40 mEq by mouth daily. ) 60 tablet 3  . predniSONE (DELTASONE) 10 MG tablet Take 3 tabs (30 mg total) for 7 days, then 2  tabs ( 20 mg total) for 7 days, then 1 tab (10 mg total) for 7 days. (Patient taking differently: Take 7.5 mg by mouth daily. ) 42 tablet 0  . tacrolimus (PROGRAF) 0.5 MG capsule Take 1.5 mg by mouth 2 (two) times daily.    . Azelastine-Fluticasone 137-50 MCG/ACT SUSP 1 spray per nostril twice a day (Patient not taking: Reported on 07/14/2018) 1 Bottle  1  . benzonatate (TESSALON) 200 MG capsule Take 1 capsule (200 mg total) by mouth 3 (three) times daily as needed for cough. (Patient not taking: Reported on 07/14/2018) 30 capsule 1  . fluticasone (FLONASE) 50 MCG/ACT nasal spray Place 1 spray into both nostrils daily as needed for allergies or rhinitis.    [name] traZODone (DESYREL) 50 MG tablet Take 1 tablet (50 mg total) by mouth at bedtime as needed for sleep. 30 tablet 3    Objective: BP (!) 119/53   Pulse 95   Temp (S) 99.5 F (37.5 C) (Oral)   Resp 14   Ht 5' 5" (1.651 m)   Wt 63 kg   SpO2 97%   BMI 23.13 kg/m        Physical Exam: General: NAD, pleasant, laying in bed on side Eyes: PERRL, EOMI, no conjunctival pallor or injection ENTM: Moist mucous membranes, no pharyngeal erythema or exudate Neck: Supple, no JVP, Full ROM Cardiovascular: Regular rhythm, tachycardic no m/r/g, no LE edema Respiratory: CTAB, no crackles, wheezes normal work of breathing on 4 L [place] Gastrointestinal: soft, nontender, nondistended MSK: moves 4 extremities equally, tender to palpation around right wrist, mildly erythematous and warm. Limited ROM at wrist. Mild tenderness and stiffness reported in all digits at PIP and DIP. No nodules or swelling noted. Left arm, wrist and hand with normal exam. Skin: no rashes appreciated, Mild erythema noted on the lateral aspect of right arm and forearm. Neuro: CN II-XII grossly intact Psych: AOx3, appropriate affect  Labs and Imaging: CBC BMET  Recent Labs  Lab 07/24/18 1524  WBC 9.6  HGB 9.3*  HCT 32.4*  PLT 461*   Recent Labs  Lab 07/24/18 1524  NA 140  K 3.6  CL 107  CO2 25  BUN 7  CREATININE 0.61  GLUCOSE 123*  CALCIUM 9.0      trop 0.03 LA 2.28>0.82  Dg Chest 2 View  Result Date: 07/24/2018 CLINICAL DATA:  Cough, shortness of breath and fever in a patient with a history of interstitial lung disease. EXAM: CHEST - 2 VIEW COMPARISON:  PA and lateral chest 07/14/2018, 06/12/2018 and  12/01/2017. CT chest 04/08/2017. FINDINGS: Prominence of the pulmonary station and diffuse hazy opacities consistent with chronic interstitial lung disease are unchanged in appearance. No pneumothorax or pleural effusion. Heart size is upper normal. No acute or focal bony abnormality. Pacing device is noted. IMPRESSION: Lung disease. No acute abnormality in patient with chronic interstitial Electronically Signed   By: Inge Rise M.D.   On: 07/24/2018 16:10      Marjie Skiff, MD 07/24/2018, 9:36 PM PGY-3, [place]

## 2018-07-24 NOTE — ED Triage Notes (Signed)
Pt endorses pain from her fingers in her right hand to her right shoulder with shob and palpitations. Pt wears spo2 2-4L at all times for interstitial lung disease. Febrile at 102 and tachy.

## 2018-07-24 NOTE — ED Provider Notes (Signed)
[facility] EMERGENCY DEPARTMENT Provider Note   CSN: [id number] Arrival date & time: 07/24/18  1450     History   Chief Complaint Chief Complaint  Patient presents with  . Arm Pain  . Fever  . Shortness of Breath    HPI Makayla Vasquez is a 37 y.o. female.  HPI Patient presents to the emergency department with shortness of breath with cough and arm pain.  The patient states that her arm is been bothering her for about a week but got worse over the last few days.  The patient states that she does have pain with range of motion and there is some redness noted to her forearm and wrist.  Patient states that movement and palpation makes the pain worse.  Patient states that she did not take any medications prior to arrival for symptoms.  Patient states that she finished her antibiotics from her admission.  The patient denies chest pain, , headache,blurred vision, neck pain, fever,  weakness, numbness, dizziness, anorexia, edema, abdominal pain, nausea, vomiting, diarrhea, rash, back pain, dysuria, hematemesis, bloody stool, near syncope, or syncope. Past Medical History:  Diagnosis Date  . Anxiety   . Arthritis    rheumatoid arthritis- mild, no rheumatology care   . Asthma    seasonal allergies   . Asymptomatic LV dysfunction    a. Echo in Dec 2011 with EF 35 to 40%. Felt to be due to paced rhythm. b. EF 25-30% in 07/2014.  . Bipolar affective disorder (HCC)   . Cardiac pacemaker    a. Since age 41 in 43. b. Upgrade to BiV in 2013.  [name] Carpal tunnel syndrome of right wrist   . CHF (congestive heart failure) (HCC)   . Congenital complete AV block   . Cor pulmonale, chronic (HCC) 04/08/2018  . Depression    bipolar  . Diabetes mellitus without complication (HCC)   . GERD (gastroesophageal reflux disease)   . Hypertension   . Interstitial lung disease (HCC)   . Lupus (HCC)   . Lupus (systemic lupus erythematosus) (HCC)   . Obesity   . Pneumonitis    a. a/w  hypoxia - inflammatory - large workup 07/2014.  . Presence of permanent cardiac pacemaker   . Seizures (HCC)    as a child- from high fever  . Sinus tachycardia     Patient Active Problem List   Diagnosis Date Noted  . Anxiety state 07/21/2018  . UTI due to Klebsiella species   . Sepsis due to Klebsiella (HCC) 07/14/2018  . Upper airway cough syndrome 06/18/2018  . Cor pulmonale, chronic (HCC) 04/08/2018  . Abnormal uterine bleeding 02/14/2018  . Dyspareunia, female 05/14/2017  . 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 On Cellcept therapy   . Systemic lupus erythematosus (HCC)   . Long term (current) use of systemic steroids   . Chronic diastolic congestive heart failure (HCC)   . Interstitial lung disease (HCC)   . Anemia   . Bipolar affective disorder in remission (HCC)   . Acute on chronic congestive heart failure (HCC)   . 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 Adrenal insufficiency (HCC)   . Antisynthetase syndrome (HCC)   . Idiopathic hypotension 07/08/2015  . Hyperlipidemia associated with type 2 diabetes mellitus (HCC) 05/04/2015  . Diabetes mellitus type 2 in obese (HCC)   . Chronic respiratory failure with hypoxia (HCC) 08/07/2014  . Acute respiratory failure with hypoxia (HCC) 06/19/2014  . ILD (interstitial  lung disease) (HCC) 06/19/2014  . Congestive dilated cardiomyopathy (HCC) 05/31/2014  . Chronic systolic heart failure (HCC) 02/26/2012  . PPM-Medtronic   . Congenital complete AV block   . GERD (gastroesophageal reflux disease)   . 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 Polycystic ovaries 12/17/2006    Past Surgical History:  Procedure Laterality Date  . ATRIAL TACH ABLATION N/A 08/14/2014   Procedure: ATRIAL TACH ABLATION;  Surgeon: Marinus Maw, MD;  Location: [facility] CATH LAB;  Service: Cardiovascular;  Laterality: N/A;  . BI-VENTRICULAR PACEMAKER UPGRADE N/A 03/08/2012   Procedure: BI-VENTRICULAR PACEMAKER UPGRADE;  Surgeon: Marinus Maw, MD;  Location: [facility] CATH LAB;  Service: Cardiovascular;  Laterality: N/A;  . CARPAL TUNNEL WITH CUBITAL TUNNEL Right 07/26/2013   Procedure: RIGHT LIMITED OPEN CARPAL TUNNEL RELEASE ,  RIGHT CUBITAL TUNNEL RELEASE, INSITU VERSES ULNAR NERVE DECOMPRESSION AND ANTERIOR TRANSPOSITION;  Surgeon: Dominica Severin, MD;  Location: [facility] OR;  Service: Orthopedics;  Laterality: Right;  . CESAREAN SECTION    . CHOLECYSTECTOMY    . CYST EXCISION  12/10/2012   THYROID  . INSERT / REPLACE / REMOVE PACEMAKER     2001  . IUD REMOVAL  11/03/2011   Procedure: INTRAUTERINE DEVICE (IUD) REMOVAL;  Surgeon: Myra C. Marice Potter, MD;  Location: [facility] ORS;  Service: Gynecology;  Laterality: N/A;  . NASAL FRACTURE SURGERY     /w plate   . RIGHT HEART CATHETERIZATION N/A 10/26/2014   Procedure: RIGHT HEART CATH;  Surgeon: Dolores Patty, MD;  Location: [facility] CATH LAB;  Service: Cardiovascular;  Laterality: N/A;  . THROAT SURGERY  1994   s/p laser treatment  . THYROGLOSSAL DUCT CYST N/A 12/10/2012   Procedure: REVISION OF THYROGLOSSAL DUCT CYST EXCISION;  Surgeon: Serena Colonel, MD;  Location: [facility] OR;  Service: ENT;  Laterality: N/A;  Revision of Thyroglossal Duct Cyst Excision  . VIDEO BRONCHOSCOPY Bilateral 06/19/2014   Procedure: VIDEO BRONCHOSCOPY WITHOUT FLUORO;  Surgeon: Kalman Shan, MD;  Location: [facility] ENDOSCOPY;  Service: Cardiopulmonary;  Laterality: Bilateral;     OB History    Gravida  1   Para  1   Term  1   Preterm      AB      Living  1     SAB      TAB      Ectopic      Multiple      Live Births               Home Medications    Prior to Admission medications   Medication Sig Start Date End Date Taking? Authorizing Provider  dicyclomine (BENTYL) 20 MG tablet Take 20 mg by mouth 4 (four) times daily -  before meals and at bedtime.    Yes [provider]  Etonogestrel (NEXPLANON [place]) Inject 1 application into the skin once.    Yes [provider]  furosemide (LASIX) 20  MG tablet TAKE 1 TABLET BY MOUTH EVERY DAY Patient taking differently: Take 20 mg by mouth daily.  05/03/18  Yes Wendee Beavers, DO  LANTUS SOLOSTAR 100 UNIT/ML Solostar Pen INJECT 15 UNITS SUBCUTANEOUSLY EVERY DAY Patient taking differently: Inject 15 Units into the skin daily as needed (High blood sugar).  04/26/18  Yes Wendee Beavers, DO  metFORMIN (GLUCOPHAGE) 1000 MG tablet Take 1 tablet (1,000 mg total) by mouth 2 (two) times daily with a meal. 02/01/18  Yes Marquette Saa, MD  metoprolol succinate (TOPROL XL) 25 MG 24  hr tablet Take 1 tablet (25 mg total) by mouth 2 (two) times daily. Patient taking differently: Take 25 mg by mouth daily.  05/25/18 05/25/19 Yes Laurey Morale, MD  mycophenolate (CELLCEPT) 500 MG tablet Take 1,000 mg by mouth 2 (two) times daily.    Yes [provider]  NOVOLOG FLEXPEN 100 UNIT/ML FlexPen INJECT 8 UNITS INTO THE SKIN 3 (THREE) TIMES DAILY WITH MEALS. Patient taking differently: Inject 8 Units into the skin 3 (three) times daily with meals.  09/30/17  Yes Marquette Saa, MD  OXYGEN Inhale 2-4 L into the lungs.    Yes [provider]  potassium chloride SA (K-DUR,KLOR-CON) 20 MEQ tablet TAKE 2 TABLETS BY MOUTH ONCE DAILY Patient taking differently: Take 40 mEq by mouth daily.  05/27/18  Yes Laurey Morale, MD  predniSONE (DELTASONE) 10 MG tablet Take 3 tabs (30 mg total) for 7 days, then 2 tabs ( 20 mg total) for 7 days, then 1 tab (10 mg total) for 7 days. Patient taking differently: Take 7.5 mg by mouth daily.  04/14/18  Yes Marquette Saa, MD  tacrolimus (PROGRAF) 0.5 MG capsule Take 1.5 mg by mouth 2 (two) times daily. 11/27/17  Yes [provider]  Azelastine-Fluticasone 137-50 MCG/ACT SUSP 1 spray per nostril twice a day Patient not taking: Reported on 07/14/2018 06/18/18   Glenford Bayley, NP  benzonatate (TESSALON) 200 MG capsule Take 1 capsule (200 mg total) by mouth 3 (three) times daily as  needed for cough. Patient not taking: Reported on 07/14/2018 06/18/18   Glenford Bayley, NP  fluticasone [facility]) 50 MCG/ACT nasal spray Place 1 spray into both nostrils daily as needed for allergies or rhinitis.    [provider]  traZODone (DESYREL) 50 MG tablet Take 1 tablet (50 mg total) by mouth at bedtime as needed for sleep. 04/12/18   Marquette Saa, MD    Family History Family History  Problem Relation Age of Onset  . Heart disease Mother        CHF (no details)  . Hypertension Mother   . Lupus Mother   . Heart disease Father        Murmur  . Heart disease Sister 17        No details.  History of a pacemaker    Social History Social History   Tobacco Use  . Smoking status: Former Smoker    Packs/day: 0.25    Years: 0.50    Pack years: 0.12    Types: Cigarettes    Last attempt to quit: 07/25/1996    Years since quitting: 22.0  . Smokeless tobacco: Never Used  Substance Use Topics  . Alcohol use: No  . Drug use: No     Allergies   Atorvastatin; Sertraline hcl; and Tape   Review of Systems Review of Systems  All other systems negative except as documented in the HPI. All pertinent positives and negatives as reviewed in the HPI. Physical Exam Updated Vital Signs BP 100/61   Pulse 98   Temp (S) 99.5 F (37.5 C) (Oral)   Resp (!) 24   Ht 5\' 5"  (1.651 m)   Wt 63 kg   SpO2 99%   BMI 23.13 kg/m   Physical Exam  Constitutional: She is oriented to person, place, and time. She appears well-developed and well-nourished. No distress.  HENT:  Head: Normocephalic and atraumatic.  Mouth/Throat: Oropharynx is clear and moist.  Eyes: Pupils are equal, round, and  reactive to light.  Neck: Normal range of motion. Neck supple.  Cardiovascular: Normal rate, regular rhythm and normal heart sounds. Exam reveals no gallop and no friction rub.  No murmur heard. Pulmonary/Chest: Effort normal and breath sounds normal. No respiratory distress. She  has no wheezes.  Abdominal: Soft. Bowel sounds are normal. She exhibits no distension. There is no tenderness.  Neurological: She is alert and oriented to person, place, and time. She exhibits normal muscle tone. Coordination normal.  Skin: Skin is warm and dry. Capillary refill takes less than 2 seconds. No rash noted. No erythema.  Psychiatric: She has a normal mood and affect. Her behavior is normal.  Nursing note and vitals reviewed.    ED Treatments / Results  Labs (all labs ordered are listed, but only abnormal results are displayed) Labs Reviewed  COMPREHENSIVE METABOLIC PANEL - Abnormal; Notable for the following components:      Result Value   Glucose, Bld 123 (*)    Albumin 3.2 (*)    All other components within normal limits  CBC WITH DIFFERENTIAL/PLATELET - Abnormal; Notable for the following components:   Hemoglobin 9.3 (*)    HCT 32.4 (*)    MCH 23.8 (*)    MCHC 28.7 (*)    RDW 16.2 (*)    Platelets 461 (*)    All other components within normal limits  TROPONIN I - Abnormal; Notable for the following components:   Troponin I 0.03 (*)    All other components within normal limits  I-STAT CG4 LACTIC ACID, ED - Abnormal; Notable for the following components:   Lactic Acid, Venous 2.28 (*)    All other components within normal limits  URINALYSIS, ROUTINE W REFLEX MICROSCOPIC  I-STAT BETA HCG BLOOD, ED ([facility], [facility], [facility] ONLY)  I-STAT CG4 LACTIC ACID, ED    EKG None  Radiology Dg Chest 2 View  Result Date: 07/24/2018 CLINICAL DATA:  Cough, shortness of breath and fever in a patient with a history of interstitial lung disease. EXAM: CHEST - 2 VIEW COMPARISON:  PA and lateral chest 07/14/2018, 06/12/2018 and 12/01/2017. CT chest 04/08/2017. FINDINGS: Prominence of the pulmonary station and diffuse hazy opacities consistent with chronic interstitial lung disease are unchanged in appearance. No pneumothorax or pleural effusion. Heart size is upper normal. No acute or focal  bony abnormality. Pacing device is noted. IMPRESSION: Lung disease. No acute abnormality in patient with chronic interstitial Electronically Signed   By: Drusilla Kanner M.D.   On: 07/24/2018 16:10    Procedures Procedures (including critical care time)  Medications Ordered in ED Medications  vancomycin (VANCOCIN) IVPB 1000 mg/200 mL premix (1,000 mg Intravenous New Bag/Given 07/24/18 1855)  acetaminophen (TYLENOL) tablet 650 mg (650 mg Oral Given 07/24/18 1526)     Initial Impression / Assessment and Plan / ED Course  I have reviewed the triage vital signs and the nursing notes.  Pertinent labs & imaging results that were available during my care of the patient were reviewed by me and considered in my medical decision making (see chart for details).     Patient will be admitted to the hospital by the family practice residents.  The patient will start on antibiotics for what looks like a cellulitis of the wrist and forearm.  Patient does have multiple symptoms and this could represent another etiology as well as far as the fevers concern.  She does have a cough and a recent pneumonia and hospitalization.  The patient has been stable otherwise  here in the emergency department.  Final Clinical Impressions(s) / ED Diagnoses   Final diagnoses:  None    ED Discharge Orders    None       Charlestine Night, PA-C 07/24/18 2000    Sabas Sous, MD 07/24/18 2244

## 2018-07-24 NOTE — ED Notes (Signed)
ED Provider at bedside. 

## 2018-07-24 NOTE — Progress Notes (Signed)
Pharmacy Antibiotic Note  Makayla Vasquez is a 37 y.o. female admitted on 07/24/2018 with cellulitis.  Pharmacy has been consulted for vancomycin dosing.  Plan: Vancomycin 1000mg  IV q12h Monitor LOT, cultures, renal funx  Height: 5\' 5"  (165.1 cm) Weight: 139 lb (63 kg) IBW/kg (Calculated) : 57  Temp (24hrs), Avg:100.8 F (38.2 C), Min:99.5 F (37.5 C), Max:102 F (38.9 C)  Recent Labs  Lab 07/24/18 1524 07/24/18 1532 07/24/18 1725  WBC 9.6  --   --   CREATININE 0.61  --   --   LATICACIDVEN  --  2.28* 0.82    Estimated Creatinine Clearance: 85.8 mL/min (by C-G formula based on SCr of 0.61 mg/dL).    Allergies  Allergen Reactions  . 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 Sertraline Hcl Hives  . Tape Other (See Comments)    Burns skin    Antimicrobials this admission: 10/5 Vancomycin >>  Dose adjustments this admission: none  Microbiology results: none Thank you for allowing pharmacy to be a part of this patient's care.  09/23/18, PharmD, BCPS Clinical Pharmacist [phone] Please check AMION for all [facility] Pharm[facility] numbers 07/24/2018

## 2018-07-24 NOTE — ED Notes (Signed)
Attempted report 

## 2018-07-24 NOTE — ED Notes (Signed)
PA at bedside.

## 2018-07-25 ENCOUNTER — Inpatient Hospital Stay (HOSPITAL_COMMUNITY): Payer: Medicare Other

## 2018-07-25 DIAGNOSIS — M25531 Pain in right wrist: Secondary | ICD-10-CM

## 2018-07-25 DIAGNOSIS — M329 Systemic lupus erythematosus, unspecified: Secondary | ICD-10-CM

## 2018-07-25 DIAGNOSIS — M79601 Pain in right arm: Secondary | ICD-10-CM

## 2018-07-25 LAB — CBC
HEMATOCRIT: 25.7 % — AB (ref 36.0–46.0)
Hemoglobin: 7.5 g/dL — ABNORMAL LOW (ref 12.0–15.0)
MCH: 24.1 pg — ABNORMAL LOW (ref 26.0–34.0)
MCHC: 29.2 g/dL — ABNORMAL LOW (ref 30.0–36.0)
MCV: 82.6 fL (ref 78.0–100.0)
Platelets: 333 10*3/uL (ref 150–400)
RBC: 3.11 MIL/uL — AB (ref 3.87–5.11)
RDW: 16.1 % — ABNORMAL HIGH (ref 11.5–15.5)
WBC: 6.8 10*3/uL (ref 4.0–10.5)

## 2018-07-25 LAB — BASIC METABOLIC PANEL
Anion gap: 10 (ref 5–15)
BUN: 7 mg/dL (ref 6–20)
CO2: 24 mmol/L (ref 22–32)
CREATININE: 0.55 mg/dL (ref 0.44–1.00)
Calcium: 8.1 mg/dL — ABNORMAL LOW (ref 8.9–10.3)
Chloride: 104 mmol/L (ref 98–111)
GFR calc Af Amer: >60 mL/min (ref >60)
GLUCOSE: 102 mg/dL — AB (ref 70–99)
POTASSIUM: 3.7 mmol/L (ref 3.5–5.1)
Sodium: 138 mmol/L (ref 135–145)

## 2018-07-25 LAB — C-REACTIVE PROTEIN: CRP: 8.2 mg/dL — ABNORMAL HIGH (ref <1.0)

## 2018-07-25 LAB — TROPONIN I

## 2018-07-25 LAB — GLUCOSE, CAPILLARY
GLUCOSE-CAPILLARY: 190 mg/dL — AB (ref 70–99)
GLUCOSE-CAPILLARY: 117 mg/dL — AB (ref 70–99)
Glucose-Capillary: 159 mg/dL — ABNORMAL HIGH (ref 70–99)
Glucose-Capillary: 267 mg/dL — ABNORMAL HIGH (ref 70–99)

## 2018-07-25 LAB — CK: Total CK: 1254 U/L — ABNORMAL HIGH (ref 38–234)

## 2018-07-25 LAB — SEDIMENTATION RATE: Sed Rate: 75 mm/hr — ABNORMAL HIGH (ref 0–22)

## 2018-07-25 MED ORDER — ACETAMINOPHEN 650 MG RE SUPP: 650.0000 mg | Freq: Four times a day (QID) | RECTAL | Status: DC

## 2018-07-25 MED ORDER — ENOXAPARIN SODIUM 40 MG/0.4ML ~~LOC~~ SOLN
40.0000 mg | SUBCUTANEOUS | Status: DC
  Administered 2018-07-26: 40 mg via SUBCUTANEOUS
  Filled 2018-07-25: qty 0.4

## 2018-07-25 MED ORDER — GUAIFENESIN-DM 100-10 MG/5ML PO SYRP
5.0000 mL | ORAL_SOLUTION | ORAL | Status: DC | PRN
  Administered 2018-07-25 (×2): 5 mL via ORAL
  Filled 2018-07-25 (×2): qty 5

## 2018-07-25 MED ORDER — GABAPENTIN 300 MG PO CAPS
300.0000 mg | ORAL_CAPSULE | Freq: Once | ORAL | Status: AC
  Administered 2018-07-25: 300 mg via ORAL
  Filled 2018-07-25: qty 1

## 2018-07-25 MED ORDER — ACETAMINOPHEN 325 MG PO TABS
650.0000 mg | ORAL_TABLET | Freq: Four times a day (QID) | ORAL | Status: DC
  Administered 2018-07-25 – 2018-07-26 (×4): 650 mg via ORAL
  Filled 2018-07-25 (×5): qty 2

## 2018-07-25 NOTE — Progress Notes (Signed)
Pt is asymptomatic. Will continue to monitor. 

## 2018-07-25 NOTE — Progress Notes (Signed)
Family Medicine Teaching Service Daily Progress Note Intern Pager: [phone]  Patient name: Makayla Vasquez Medical record number: [id number] Date of birth: [birth year] Age: 37 y.o. Gender: female  Primary Care Provider: Estero Bing, DO Consultants: None Code Status: Full   Pt Overview and Major Events to Date:  Admitted 10/6  Assessment and Plan: Makayla Vasquez is a 37 y.o. 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  #Right hand and wrist pain, subacute, improving This morning WBC 6.8, afebrile with HR 85. BP 92/58 and O2 sat 100% on home 4L. Pain well controlled with acetaminophen. CRP is 8.2 expected given history of autoimmune disease. Most recent CRP 4.3 back in June. Possible flare up of lupus causing inflammation in wrist and hand. Less suspicious for cellulitis. Patient has received one dose of vancomycin. UA was negative . --Follow-up on blood and urine cultures (urine collected after started vancomycin) --F/u on ESR  --Continue acetaminophen 650 mg every 6 as needed --Monitor vitals  --NS 100 cc/hr for 12 hours  #Cough, chronic, not improving Likely secondary to viral infection in the setting of worsening pulmonary function given ILD and worsening RVSP seen on echo a week ago. Currently O2 sat 100% on Home oxygen of 4L. CXR w/o any new changes. No sign of volume overload on lung exam. --Continue to monitor respiratory status. --Ambulate with pulse oximetry --Will hold off on breathing treatment for now but would consider if acutely worsened --Resume prednisone after r/o infx   Acute on chronic HFpEF, Patient had an echo during he recent admission which showed an EF  55% with moderate mitral and tricuspid regurgitation and increase RSVP 60 mm Hg. No clinical evidence of volume overload on admission. CXR was also negative for vascular congestion. Trop 0.03. Troponin are down trending 0.03--> <0.03x2. Weight stable since last admission. --NS @ 100 cc/hr  for 12 hours --Daily weights --Strict I/o --Hold lasix and reassess volume status --Continue to trend troponin  #Interstitial lung disease on 2-4L home O2  Pulmonary HTN  Cor pulmonale: Patient with significant lung disease.  Reports that she is on 7.5 mg prednisone daily as well as 2 to 4 L of home oxygen.  Patient reports that she uses 4 L at night when sleeping and during the day usually uses 2 L but will sometimes require 4. Patient has been requiring 5L in the past few days she mostly attribute that increase to the cough. Patient does not have any inhalers at home.  Shortness of breath worse upon laying per patient. --Monitor respiratory status --Consider stress dose steroids --Patient will to follow up with pulm  #Congenital complete AV block  prolonged QT syndrome   cardiomyopathy:  EKG showed paced rhythm. Trop 0.03. No chest pain reported. --Will admit to telemetry --Avoid QT prolonging drugs --Holding metoprolol in setting of mild hypotension.   #Anemia, chronic, improving Anemia panel during previous admission 10 days ago showed evidence of B12 and iron deficiency. Baseline hemoglobin ~10, on admission today 9.3 much improved since discharged a week ago (9/28) 7.9. This am hemoglobin is 7.5. Could be dilutional. --Monitor Hemoglobin --Will continue B12 and iron supplementation when clinically appropriate  #T2DM, well controlled Last hemoglobin A1c 6.6 on 07/16/2018.  Patient on Lantus 15 units and NovoLog 8 units 3 times daily with meals.  Patient reports compliance. On admission, BG is 123. Patient reports most of her BG at home have been in the low 100's. Given possible infection, will  not be aggressive in our glycemic control. Am BG 102 --Start patient on sensitive SSI  --Resume home regimen once clinically appropriate  #Adrenal insufficiency Patient on 7.5 mg prednisone daily.  Consider stress to steroids if patient does not improve.   #Lupus Patient on tacrolimus and cellcept for immunosuppression given lupus.  Patient also reports joint pain throughout with her lupus.Will hold tacrolimus in the setting of possible infection --Restart tacrolimus, cellcept when clinically appropriate  --Continue Bentyl for muscle spasms  #Protein calorie malnutrition Albumin on admission 3.2 likely in the setting of poor po intake in the past few days.  #GERD, chronic, stable:  -Monitor -Patient does not appear to be on PPI, consider starting PPI in setting of chronic prednisone use  #Bipolar, stable: -Patient not currently on any medications  FEN/GI: Heart healthy/ Carb modified Prophylaxis: Lovenox  FEN/GI: Heart healthy/ Carb modified PPx: Lovenox  Disposition: Pending clinical improvement  Subjective:  Patient feeling better this morning. Pain in her wrist is well controlled with tylenol. She continue to complain of cough, but denies any fever or chills.  Objective: Temp:  [97.9 F (36.6 C)-102 F (38.9 C)] 98.2 F (36.8 C) (10/06 0634) Pulse Rate:  [85-133] 85 (10/06 0634) Resp:  [14-24] 18 (10/06 0634) BP: (92-126)/(53-88) 92/58 (10/06 0634) SpO2:  [96 %-100 %] 100 % (10/06 0634) Weight:  [63 kg] 63 kg (10/06 0458)  Physical Exam: General: NAD, pleasant, laying in bed on side Eyes: PERRL, EOMI, no conjunctival pallor or injection ENTM: Moist mucous membranes, no pharyngeal erythema or exudate Neck: Supple, no JVP, Full ROM Cardiovascular: Regular rhythm, tachycardic no m/r/g, no LE edema Respiratory: CTAB, no crackles, wheezes normal work of breathing on 4 L [place] [facility]: soft, nontender, nondistended MSK: moves 4 extremities equally, tender  to palpation around right wrist, mildly erythematous and warm. Limited ROM at wrist. Mild tenderness and stiffness reported in all digits at PIP and DIP. No nodules or swelling noted. Left arm, wrist and hand with normal exam. Skin: no rashes appreciated, Mild erythema noted on the lateral aspect of right arm and forearm. Neuro: CN II-XII grossly intact Psych: AOx3, appropriate affect   Laboratory: Recent Labs  Lab 07/24/18 1524 07/25/18 0557  WBC 9.6 6.8  HGB 9.3* 7.5*  HCT 32.4* 25.7*  PLT 461* 333   Recent Labs  Lab 07/24/18 1524  NA 140  K 3.6  CL 107  CO2 25  BUN 7  CREATININE 0.61  CALCIUM 9.0  PROT 7.5  BILITOT 0.8  ALKPHOS 48  ALT 26  AST 31  GLUCOSE 123*   Trop: 0.03-->>( <0.03x2) CRP: 8.2  Imaging/Diagnostic Tests: Dg Chest 2 View  Result Date: 07/24/2018 CLINICAL DATA:  Cough, shortness of breath and fever in a patient with a history of interstitial lung disease. EXAM: CHEST - 2 VIEW COMPARISON:  PA and lateral chest 07/14/2018, 06/12/2018 and 12/01/2017. CT chest 04/08/2017. FINDINGS: Prominence of the pulmonary station and diffuse hazy opacities consistent with chronic interstitial lung disease are unchanged in appearance. No pneumothorax or pleural effusion. Heart size is upper normal. No acute or focal bony abnormality. Pacing device is noted. IMPRESSION: Lung disease. No acute abnormality in patient with chronic interstitial Electronically Signed   By: Inge Rise M.D.  On: 07/24/2018 16:10    Marjie Skiff, MD 07/25/2018, 7:55 AM PGY-3, [place] Intern pager: [phone], text pages welcome

## 2018-07-25 NOTE — Progress Notes (Signed)
Pt is asymptomatic. Will continue to monitor.

## 2018-07-25 NOTE — Progress Notes (Signed)
Pt refusing to walk at this time  Will try again later  Pt aware of needing o2 saturation

## 2018-07-25 NOTE — Progress Notes (Signed)
X-ray technician at bedside  Educated pt on test. Pt understanding

## 2018-07-26 LAB — CBC WITH DIFFERENTIAL/PLATELET
Abs Immature Granulocytes: 0 10*3/uL (ref 0.0–0.1)
Basophils Absolute: 0 10*3/uL (ref 0.0–0.1)
Basophils Relative: 1 %
EOS ABS: 0.5 10*3/uL (ref 0.0–0.7)
EOS PCT: 8 %
HEMATOCRIT: 26.5 % — AB (ref 36.0–46.0)
HEMOGLOBIN: 7.5 g/dL — AB (ref 12.0–15.0)
IMMATURE GRANULOCYTES: 0 %
LYMPHS PCT: 14 %
Lymphs Abs: 0.8 10*3/uL (ref 0.7–4.0)
MCH: 23.5 pg — ABNORMAL LOW (ref 26.0–34.0)
MCHC: 28.3 g/dL — AB (ref 30.0–36.0)
MCV: 83.1 fL (ref 78.0–100.0)
MONOS PCT: 6 %
Monocytes Absolute: 0.4 10*3/uL (ref 0.1–1.0)
Neutro Abs: 4.1 10*3/uL (ref 1.7–7.7)
Neutrophils Relative %: 71 %
Platelets: 382 10*3/uL (ref 150–400)
RBC: 3.19 MIL/uL — AB (ref 3.87–5.11)
RDW: 15.9 % — AB (ref 11.5–15.5)
WBC: 5.8 10*3/uL (ref 4.0–10.5)

## 2018-07-26 LAB — GLUCOSE, CAPILLARY
GLUCOSE-CAPILLARY: 97 mg/dL (ref 70–99)
Glucose-Capillary: 130 mg/dL — ABNORMAL HIGH (ref 70–99)

## 2018-07-26 MED ORDER — ADULT MULTIVITAMIN W/MINERALS CH
1.0000 | ORAL_TABLET | Freq: Every day | ORAL | Status: DC
  Administered 2018-07-26: 1 via ORAL
  Filled 2018-07-26: qty 1

## 2018-07-26 MED ORDER — GABAPENTIN 300 MG PO CAPS
300.0000 mg | ORAL_CAPSULE | Freq: Once | ORAL | Status: DC
  Filled 2018-07-26: qty 1

## 2018-07-26 MED ORDER — PREDNISONE 5 MG PO TABS
7.5000 mg | ORAL_TABLET | Freq: Every day | ORAL | Status: DC
  Administered 2018-07-26: 7.5 mg via ORAL
  Filled 2018-07-26: qty 2

## 2018-07-26 MED ORDER — TACROLIMUS 1 MG PO CAPS
1.5000 mg | ORAL_CAPSULE | Freq: Two times a day (BID) | ORAL | Status: DC
  Administered 2018-07-26: 1.5 mg via ORAL
  Filled 2018-07-26 (×3): qty 1

## 2018-07-26 MED ORDER — PREDNISONE 2.5 MG PO TABS: ORAL_TABLET | ORAL | 0 refills | Status: DC

## 2018-07-26 MED ORDER — MYCOPHENOLATE MOFETIL 250 MG PO CAPS
1000.0000 mg | ORAL_CAPSULE | Freq: Two times a day (BID) | ORAL | Status: DC
  Administered 2018-07-26: 1000 mg via ORAL
  Filled 2018-07-26 (×2): qty 4

## 2018-07-26 MED ORDER — GUAIFENESIN-DM 100-10 MG/5ML PO SYRP: 5.0000 mL | ORAL_SOLUTION | ORAL | 0 refills | Status: DC | PRN

## 2018-07-26 NOTE — Discharge Summary (Signed)
[facility] Discharge Summary  Patient name: Makayla Vasquez Medical record number: [id number] Date of birth: Apr 01, 1981 Age: 37 y.o. Gender: female Date of Admission: 07/24/2018  Date of Discharge: 07/26/2018 Admitting Physician: Lind Covert, MD  Primary Care Provider: [place] Bing, DO Consultants: none  Indication for Hospitalization: right wrist pain  Discharge Diagnoses/Problem List:  exacerbation on rheumatological condition  Disposition: discharge to home with rheum f/u Oct 20th  Discharge Condition: improved  Discharge Exam:  General: resting comfortably sitting up in bed Heart: RRR, no murmurs appreciated Lungs: CTAB, no increased WOB. On 4L [place] GI: abd soft, non-distended, non-tender to palpation MSK: PIP and DIP deformities bilaterally. Tenderness to palpation on right hand Derm: no erythema on right wrist. No rashes appreciated. Neuro: alert and oriented Psych: flat affect, calm, cooperative  Brief Hospital Course:  Patient presented with fever, right wrist pain with erythema, swelling and decreased ROM 2/2 pain. Received one dose of vancomycin in ED. Xray imaging was negative for fractures or arthritic changes. Her pain was controlled with medication. Wrist pain and redness improved throughout admission. Her WBC remained wnl for entire admission. Blood pressures were low normal for patient with systolics in 36I and diastolics in 68E- her home BP medication was held. Stable throughout admission. Patient had increased labs for inflammatory markers (CRP, ESR and CK). She remained afebrile and wrist pain was presumed non-infectious so restarted rheumatologic medications. At time of discharge, patient was on home medications and home oxygen requirement with improved pain in wrist and no redness or swelling. She was also given a prednisone burst of 48m daily x 5 days which will need to be tapered. She was discharged with instructions to  follow up with rheumatology on Oct 20th.  Issues for Follow Up:  1. Patient will need f/u outpatient for rheum management of Lupus 2. Patient will need f/u with PCP to discuss restarting metoprolol which was held inpatient due to low blood pressures. 3. Patient will need f/u with PCP due to significant downtrend of weight in the past few years. (currently 64kg) 4. Patient will need f/u with PCP for her prednisone burst and taper. She was prescribed 56mdaily x 5 days starting 10/7, then 4046m3days, then 34m38m days, then 20mg81mdays, then home dose of 7.5mg/d33m  Significant Procedures: none  Significant Labs and Imaging:  Recent Labs  Lab 07/24/18 1524 07/25/18 0557 07/26/18 0509  WBC 9.6 6.8 5.8  HGB 9.3* 7.5* 7.5*  HCT 32.4* 25.7* 26.5*  PLT 461* 333 382   Recent Labs  Lab 07/24/18 1524 07/25/18 0557  NA 140 138  K 3.6 3.7  CL 107 104  CO2 25 24  GLUCOSE 123* 102*  BUN 7 7  CREATININE 0.61 0.55  CALCIUM 9.0 8.1*  ALKPHOS 48  --   AST 31  --   ALT 26  --   ALBUMIN 3.2*  --     CK 1,254 troponins neg CRP 8.2 ESR 75  Results/Tests Pending at Time of Discharge: none  Discharge Medications:  Allergies as of 07/26/2018      Reactions   Atorvastatin Other (See Comments)   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   fluticasone 50 MCG/ACT nasal spray Commonly known as:  FLONASE Place 1 spray into both nostrils daily as needed for allergies or rhinitis.   furosemide 20 MG  tablet Commonly known as:  LASIX TAKE 1 TABLET BY MOUTH EVERY DAY   guaiFENesin-dextromethorphan 100-10 MG/5ML syrup Commonly known as:  ROBITUSSIN DM Take 5 mLs by mouth every 4 (four) hours as needed for cough.   LANTUS SOLOSTAR 100 UNIT/ML Solostar Pen Generic drug:  Insulin Glargine INJECT 15 UNITS SUBCUTANEOUSLY EVERY DAY What changed:  See the new instructions.   metFORMIN 1000 MG tablet Commonly known as:  GLUCOPHAGE Take 1 tablet (1,000 mg total) by mouth 2 (two) times daily with a meal.   mycophenolate 500 MG tablet Commonly known as:  CELLCEPT Take 1,000 mg by mouth 2 (two) times daily.   NEXPLANON [place] Inject 1 application into the skin once.   NOVOLOG FLEXPEN 100 UNIT/ML FlexPen Generic drug:  insulin aspart INJECT 8 UNITS INTO THE SKIN 3 (THREE) TIMES DAILY WITH MEALS. What changed:  See the new instructions.   OXYGEN Inhale 2-4 L into the lungs.   potassium chloride SA 20 MEQ tablet Commonly known as:  K-DUR,KLOR-CON TAKE 2 TABLETS BY MOUTH ONCE DAILY   predniSONE 2.5 MG tablet Commonly known as:  DELTASONE Take 90m (5 tablets) per day for 5 days.Then take 458m(4 tablets) per day for 3 days.Then take 307m3 tablets) per day for 3 days.Then take 65m36m tablets) per day for 3 days.Then take your home dose of 7.5mg/79m continuously. What changed:    medication strength  additional instructions   tacrolimus 0.5 MG capsule Commonly known as:  PROGRAF Take 1.5 mg by mouth 2 (two) times daily.   traZODone 50 MG tablet Commonly known as:  DESYREL Take 1 tablet (50 mg total) by mouth at bedtime as needed for sleep.       Discharge Instructions: Please refer to Patient Instructions section of EMR for full details.  Patient was counseled important signs and symptoms that should prompt return to medical care, changes in medications, dietary instructions, activity restrictions, and follow up appointments.   Follow-Up Appointments: 1. Rheumatology Oct  20th 2. Follow up with PCP  AnderRicharda Osmond10/04/2018, 3:22 PM PGY-1, [facility]

## 2018-07-26 NOTE — Progress Notes (Signed)
Discharge to home, d/c instructions and follow up appointments discussed with patient, verbalized understanding.  No complaints of any discomfort. Awaiting for her transport.

## 2018-07-26 NOTE — Progress Notes (Signed)
Pt discharge to home per MD order in stable condition.

## 2018-07-26 NOTE — Evaluation (Signed)
Occupational Therapy Evaluation Patient Details Name: Makayla Vasquez MRN: [id number] DOB: 10-19-1981 Today's Date: 07/26/2018    History of Present Illness Patient is a 37 y/o female presenting to [facility] ED on 07/24/18 with primary complaints fo R wrist and hand pain and cough. Chest x-ray was negative for any acute pulmonary process and did not show any signs of volume overload. X-ray R wrist negative.  Past medical history significant for ILD on 2-4L home O2, congenital complete AV block, HFpEF, cardiomyopathy, T2DM, adrenal insufficiency, lupus, cor pulmonale, GERD, bipolar, prolonged QT syndrome.   Clinical Impression   PTA, pt was living her son and was independent with ADLs, IADLs, and driving. Pt requiring Min A for UB dressing, supervision for LB ADLs, and supervision for functional mobility. Pt with limited right wrist movements due to pain and pt presenting with decreased functional use of RUE; pt reporting pain at right wrist (dominant RUE) for two weeks. Pt would benefit from further acute OT to address functional use of RUE during ADLs and facilitate safe dc. Recommend dc to home once medically stable per physician.     Follow Up Recommendations  No OT follow up;Supervision - Intermittent    Equipment Recommendations  None recommended by OT    Recommendations for Other Services       Precautions / Restrictions Precautions Precautions: Fall Restrictions Weight Bearing Restrictions: No      Mobility Bed Mobility Overal bed mobility: Needs Assistance Bed Mobility: Supine to Sit     Supine to sit: Modified independent (Device/Increase time)     General bed mobility comments: increased time. decreased functional use of RUE and presents with gaurding  Transfers Overall transfer level: Needs assistance Equipment used: None Transfers: Sit to/from Stand Sit to Stand: Supervision         General transfer comment: for safety and immediate standing balance    Balance  Overall balance assessment: Mild deficits observed, not formally tested                                         ADL either performed or assessed with clinical judgement   ADL Overall ADL's : Needs assistance/impaired Eating/Feeding: Set up;Supervision/ safety;Sitting   Grooming: Set up;Supervision/safety;Sitting;Wash/dry face   Upper Body Bathing: Set up;Supervision/ safety;Sitting   Lower Body Bathing: Set up;Supervison/ safety;Sit to/from stand   Upper Body Dressing : Minimal assistance;Sitting Upper Body Dressing Details (indicate cue type and reason): Educating pt on doning RUE first. Min A for assisting second gown over RUE.  Lower Body Dressing: Set up;Supervision/safety;Sit to/from stand Lower Body Dressing Details (indicate cue type and reason): Pt donning socks with one handed techniques. Supervision for safety Toilet Transfer: Supervision/safety;Set up;Ambulation           Functional mobility during ADLs: Min guard General ADL Comments: Pt limited by pain at R wrist     Vision Baseline Vision/History: Wears glasses Wears Glasses: At all times Patient Visual Report: No change from baseline       Perception     Praxis      Pertinent Vitals/Pain Pain Assessment: Faces Faces Pain Scale: Hurts even more Pain Location: Right wrist Pain Descriptors / Indicators: Discomfort;Grimacing;Guarding;Constant Pain Intervention(s): Limited activity within patient's tolerance;Monitored during session;Repositioned     Hand Dominance Right   Extremity/Trunk Assessment Upper Extremity Assessment Upper Extremity Assessment: RUE deficits/detail RUE Deficits / Details: Decreased ROM at shoulder  and elbow due to pain at wrist. No ROM at wrist due to pain. Able to perform finger opposition with significant time. RUE Coordination: decreased fine motor;decreased gross motor   Lower Extremity Assessment Lower Extremity Assessment: Overall WFL for tasks assessed    Cervical / Trunk Assessment Cervical / Trunk Assessment: Normal   Communication Communication Communication: No difficulties   Cognition Arousal/Alertness: Awake/alert Behavior During Therapy: WFL for tasks assessed/performed Overall Cognitive Status: Within Functional Limits for tasks assessed                                 General Comments: Feel at baseline.    General Comments  use of 4L supplemental O2 for mobility; O2 sats dropping to ~76% with mobility with HR up to 130 - patient asymptomatic and able to maintain conversation throughout; 95% O2 at rest prior to session, rebound to 90% with rest at end of session    Exercises Exercises: Hand exercises;General Upper Extremity General Exercises - Upper Extremity Shoulder Flexion: AROM;Right;5 reps;Seated(Limited ROM due to pain at wrist) Shoulder Extension: AROM;Right;5 reps;Seated(Limited ROM due to pain at wrist) Elbow Flexion: AROM;Right;5 reps;Seated(Limited ROM due to pain at wrist) Elbow Extension: AROM;Right;5 reps;Seated(Limited ROM due to pain at wrist) Digit Composite Flexion: AROM;Right;5 reps(Limited ROM due to pain at wrist) Composite Extension: AROM;Right;5 reps;Seated(Limited ROM due to pain at wrist) Hand Exercises Opposition: AROM;Right;Seated(2 reps)   Shoulder Instructions      Home Living Family/patient expects to be discharged to:: Private residence Living Arrangements: Children Available Help at Discharge: Family;Available PRN/intermittently Type of Home: Apartment Home Access: Stairs to enter Entrance Stairs-Number of Steps: 2 Entrance Stairs-Rails: Left Home Layout: One level     Bathroom Shower/Tub: Chief Strategy Officer: Standard     Home Equipment: Environmental consultant - 2 wheels;Shower seat;Wheelchair - manual          Prior Functioning/Environment Level of Independence: Independent        Comments: ADLs, IADLs, driving, and caring for her son.        OT Problem  List: Decreased knowledge of precautions;Impaired UE functional use;Pain      OT Treatment/Interventions: Self-care/ADL training;Therapeutic exercise;Energy conservation;DME and/or AE instruction;Therapeutic activities;Patient/family education    OT Goals(Current goals can be found in the care plan section) Acute Rehab OT Goals Patient Stated Goal: return home, improve R wrist pain OT Goal Formulation: With patient Time For Goal Achievement: 08/09/18 Potential to Achieve Goals: Good ADL Goals Pt Will Perform Upper Body Dressing: with modified independence;sitting Pt/caregiver will Perform Home Exercise Program: Increased ROM;Right Upper extremity;Independently Additional ADL Goal #1: Pt will perform ADLs at independent level  OT Frequency: Min 2X/week   Barriers to D/C:            Co-evaluation              AM-PAC PT "6 Clicks" Daily Activity     Outcome Measure Help from another person eating meals?: None Help from another person taking care of personal grooming?: None Help from another person toileting, which includes using toliet, bedpan, or urinal?: None Help from another person bathing (including washing, rinsing, drying)?: None Help from another person to put on and taking off regular upper body clothing?: A Little Help from another person to put on and taking off regular lower body clothing?: None 6 Click Score: 23   End of Session Equipment Utilized During Treatment: Gait belt;Oxygen(4L) Nurse Communication: Mobility status  Activity Tolerance: Patient tolerated treatment well Patient left: in chair;with call bell/phone within reach;with family/visitor present  OT Visit Diagnosis: Unsteadiness on feet (R26.81);Other abnormalities of gait and mobility (R26.89);Muscle weakness (generalized) (M62.81);Pain Pain - Right/Left: Right Pain - part of body: Hand                Time: 4332-9518 OT Time Calculation (min): 14 min Charges:  OT General Charges $OT Visit: 1  Visit OT Evaluation $OT Eval Low Complexity: 1 Low  Karlena Luebke MSOT, OTR/L Acute Rehab Pager: [phone] Office: [phone]  Theodoro Grist Novelle Addair 07/26/2018, 12:47 PM

## 2018-07-26 NOTE — Evaluation (Signed)
Physical Therapy Evaluation Patient Details Name: Makayla Vasquez MRN: [id number] DOB: 03-Dec-1980 Today's Date: 07/26/2018   History of Present Illness  Patient is a 37 y/o female presenting to [facility] ED on 07/24/18 with primary complaints fo R wrist and hand pain and cough. Chest x-ray was negative for any acute pulmonary process and did not show any signs of volume overload. X-ray R wrist negative.  Past medical history significant for ILD on 2-4L home O2, congenital complete AV block, HFpEF, cardiomyopathy, T2DM, adrenal insufficiency, lupus, cor pulmonale, GERD, bipolar, prolonged QT syndrome.    Clinical Impression  Ms. Mccaffrey is a pleasant 37 y/o female admitted with the above listed diagnosis. Patient reports that prior to admission she was Ind with all aspects of mobility and ADLs. Patient today requiring general supervision level assist for general safety, does not require physical assistance for steadying or mobility. See O2/HR during mobility in general comments. No further acute PT needs identified. Patient able to ambulate with nursing staff. PT to sign off.      Follow Up Recommendations No PT follow up    Equipment Recommendations  None recommended by PT    Recommendations for Other Services       Precautions / Restrictions Precautions Precautions: Fall Restrictions Weight Bearing Restrictions: No      Mobility  Bed Mobility               General bed mobility comments: sitting EOB  Transfers Overall transfer level: Needs assistance Equipment used: None Transfers: Sit to/from Stand Sit to Stand: Supervision         General transfer comment: for safety and immediate standing balance  Ambulation/Gait Ambulation/Gait assistance: Min guard;Supervision Gait Distance (Feet): 200 Feet Assistive device: None Gait Pattern/deviations: Step-through pattern;Decreased stride length;Trunk flexed;Narrow base of support Gait velocity: decreased   General Gait Details:  use of 4L supplemental O2 for mobility; O2 sats dropping to ~76% with mobility with HR up to 130 - patient asymptomatic and able to maintain conversation throughout  [employer] Mobility    Modified Rankin (Stroke Patients Only)       Balance Overall balance assessment: Mild deficits observed, not formally tested                                           Pertinent Vitals/Pain Pain Assessment: Faces Faces Pain Scale: Hurts even more Pain Location: Right wrist Pain Descriptors / Indicators: Discomfort;Grimacing;Guarding;Constant Pain Intervention(s): Limited activity within patient's tolerance;Monitored during session    Home Living Family/patient expects to be discharged to:: Private residence Living Arrangements: Children Available Help at Discharge: Family;Available PRN/intermittently Type of Home: Apartment Home Access: Stairs to enter Entrance Stairs-Rails: Left Entrance Stairs-Number of Steps: 2 Home Layout: One level Home Equipment: Walker - 2 wheels;Shower seat;Wheelchair - manual      Prior Function Level of Independence: Independent               Hand Dominance   Dominant Hand: Right    Extremity/Trunk Assessment   Upper Extremity Assessment Upper Extremity Assessment: Defer to OT evaluation    Lower Extremity Assessment Lower Extremity Assessment: Overall WFL for tasks assessed    Cervical / Trunk Assessment Cervical / Trunk Assessment: Normal  Communication   Communication: No difficulties  Cognition Arousal/Alertness: Awake/alert Behavior During Therapy: WFL for tasks  assessed/performed Overall Cognitive Status: Within Functional Limits for tasks assessed                                        General Comments General comments (skin integrity, edema, etc.): use of 4L supplemental O2 for mobility; O2 sats dropping to ~76% with mobility with HR up to 130 - patient asymptomatic and  able to maintain conversation throughout; 95% O2 at rest prior to session, rebound to 90% with rest at end of session    Exercises     Assessment/Plan    PT Assessment Patent does not need any further PT services  PT Problem List         PT Treatment Interventions      PT Goals (Current goals can be found in the Care Plan section)  Acute Rehab PT Goals Patient Stated Goal: return home, improve R wrist pain PT Goal Formulation: With patient Time For Goal Achievement: 07/29/18 Potential to Achieve Goals: Good    Frequency     Barriers to discharge        Co-evaluation               AM-PAC PT "6 Clicks" Daily Activity  Outcome Measure Difficulty turning over in bed (including adjusting bedclothes, sheets and blankets)?: A Little Difficulty moving from lying on back to sitting on the side of the bed? : A Little Difficulty sitting down on and standing up from a chair with arms (e.g., wheelchair, bedside commode, etc,.)?: A Little Help needed moving to and from a bed to chair (including a wheelchair)?: A Little Help needed walking in hospital room?: A Little Help needed climbing 3-5 steps with a railing? : A Little 6 Click Score: 18    End of Session Equipment Utilized During Treatment: Gait belt;Oxygen(4L) Activity Tolerance: Patient tolerated treatment well Patient left: in chair;with call bell/phone within reach;with family/visitor present Nurse Communication: Mobility status PT Visit Diagnosis: Other abnormalities of gait and mobility (R26.89)    Time: 1117-3567 PT Time Calculation (min) (ACUTE ONLY): 14 min   Charges:   PT Evaluation $PT Eval Moderate Complexity: 1 Mod         Kipp Laurence, PT, DPT Supplemental Physical Therapist 07/26/18 10:59 AM Pager: [phone] Office: [phone]

## 2018-07-26 NOTE — Discharge Instructions (Signed)
You were admitted for wrist pain. We believe based on imaging and lab results that the pain is due to a flair of your inflammatory disorder. We have prescribed a prednisone burst and taper to treat this with the following decrease in doses beginning 10/7.  Take 50mg  (5 tablets) per day for 5 days. Then take 40mg  (4 tablets) per day for 3 days. Then take 30mg  (3 tablets) per day for 3 days. Then take 20mg  (2 tablets) per day for 3 days. Then take your home dose of 7.5mg /day continuously.  Please follow up with your PCP and rheumatologist regarding this hospitalization.  Secondly, due to your lower blood pressures while you were admitted, we discontinued your blood pressure medication and would like for you to get your BP checked at your PCP for follow up and discussion about continuing that medication.

## 2018-07-26 NOTE — Consult Note (Signed)
   [facility] Inpatient Consult   07/26/2018  Makayla Vasquez 03-Jun-1981 [id number]  Patient was assessed for [facility] for community services. Patient was previously active with [facility].  Met with patient and her mother at bedside regarding being restarted with [facility] services. There is an active consent on file. Patient denies any issues for [facility] follow up.  She drives, she has no issues currently with her medications and no needs for home visits.  Patient could benefit from [facility] calls follow up. Patient has multiple co-morbidities.  Of note, [facility] Care Management services does not replace or interfere with any services that are arranged by inpatient case management or social work. For additional questions or referrals please contact:  Natividad Brood, RN BSN [facility] Liaison  [phone] business mobile phone Toll free office [phone]

## 2018-07-26 NOTE — Progress Notes (Signed)
Initial Nutrition Assessment  DOCUMENTATION CODES:   Not applicable  INTERVENTION:   -D/c Ensure Enlive po BID, each supplement provides 350 kcal and 20 grams of protein -MVI with minerals daily  NUTRITION DIAGNOSIS:   Increased nutrient needs related to acute illness(cellulitis) as evidenced by estimated needs.  GOAL:   Patient will meet greater than or equal to 90% of their needs  MONITOR:   PO intake, Supplement acceptance, Labs, Weight trends, Skin, I & O's  REASON FOR ASSESSMENT:   Malnutrition Screening Tool    ASSESSMENT:   Makayla Vasquez is a 37 y.o. 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  Pt admitted with subacute rt hand and wrist pain; questionable cellulitis.   Spoke with pt at bedside, who reports good appetite- consumed 100% of breakfast this morning. She shares that she usually only consumes 1-2 meals per day at home and is very careful about what she eats to help control CBGS. Meals typically consist of a meat and two vegetables, which pt prepares herself. Noted meal completion 100%.   Reviewed wt hx; noted UBW is between 65-70 kg. Per wt hx, pt has experienced a 8.2% wt loss over the past year, which is not significant for time frame. Pt endorses a small amount of weight loss, which she attributes ti better eating habits and DM control. She reports medication compliance. She checks CBGS at least BID and reports readings between 95-150. She rarely uses her nighttime insulin, as her CBGS rarely are over 150. She reports she has always been slender ("I've always been thin and bony").   Discussed with pt importance of good nutritional intake and glycemic  control to promote healing. Pt grateful for visit, however denies further questions related to nutrition and DM at this time.   Last Hgb A1c: 6.6 (07/16/09). PTA DM medications are 15 units insulin glargine daily, 1000 mg metformin BID.   Labs reviewed: CBGS: 97-267 (inpatient orders for glycemic control are 0-9 units insulin aspart TID with meals).   NUTRITION - FOCUSED PHYSICAL EXAM:    Diet Order:   Diet Order            Diet heart healthy/carb modified Room service appropriate? Yes; Fluid consistency: Thin  Diet effective now              EDUCATION NEEDS:   Education needs have been addressed  Skin:  Skin Assessment: Reviewed RN Assessment  Last BM:  07/24/18  Height:   Ht Readings from Last 1 Encounters:  07/24/18 5\' 5"  (1.651 m)    Weight:   Wt Readings from Last 1 Encounters:  07/26/18 64 kg    Ideal Body Weight:  56.8 kg  BMI:  Body mass index is 23.48 kg/m.  Estimated Nutritional Needs:   Kcal:  1700-1900  Protein:  85-100 grams  Fluid:  1.7-1.9 L    Bensen Chadderdon A. 09/25/18, RD, LDN, CDE Pager: [phone] After hours Pager: [phone]

## 2018-07-27 ENCOUNTER — Other Ambulatory Visit: Payer: Self-pay

## 2018-07-27 NOTE — Progress Notes (Signed)
Advanced Heart Failure Clinic Note  Patient ID: Makayla Vasquez, female   DOB: 1980/11/04, 37 y.o.   MRN: [id number] PCP: Dr Blythe [facility]: Dr Shirlee Latch  HPI: Makayla Vasquez is 37 y.o. 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   She has a h/o congenital HB and underwent first PPM at age 41. In 2011 had ECHO with EF 35-40%. LV dysfunction felt to be due to RV pacing so upgraded to CRT-P. Echo in 10/15 with EF 25%.  She also has concomitant lung disease and this has limited b-blocker dosing.  She follows with Dr. Maple Hudson for her lung disease. She is a former smoker.  Admitted in 8/15 and 9/15 with worsening respiratory status and severe hypoxia. Had extensive w/u. Thought to have a viral pneumonitis complicated by atelectasis and mild edema vs. an autoimmune process. Discharged home on a course of levaquin and prednisone.   She was offered a lung biopsy for further diagnosis versus empiric treatment with steroids. She opted for empiric steroid treatment. She is followed by a rheumatologist and is on Cellcept, prednisone, and tacrolimus.  She is on oxygen and night and with activity.   Admission in 5/17 for ?sepsis and management of interstitial lung disease.     She was admitted with PNA in 1/18.  Lasix, losartan, and bisoprolol stopped.   Last echo in 1/19 showed EF 45-50%, with mild to moderately decreased RV systolic function and moderate MR.    Admitted 10/5-10/7/19 with wrist pain, treated with prednisone. Has f/u with rheumatology. Toprol DCd with BP 90s.   She returns today for post hospital follow up. Overall doing okay. Right wrist is still painful, but she sees rheumatology on Thursday. Has to take breaks in grocery due to SOB, but no problems walking from waiting room. Sometimes SOB when talking a lot. She has a productive cough with green sputum, just started this  morning. She typically has a chronic dry cough. No fever or chills. Wears 2-4 L O2 at all times. No edema, orthopnea, or PND. No dizziness or CP. She has been fatigued. Weights stable 137-138 lbs. Taking all medications. Tries to limit fluid and salt intake, but had pinto beans and chicken neck this morning.   PFTs (12/15) showed a severe restrictive defect concerning for an interstitial process.   EP study 10/15 for persistent tachycardia and found to have sinus tachycardia.   06/16/14 CT scan showed bilateral progressive lower peribronchial infiltrates with groundglass changes and nodular patter consistent with acute pneumonitis of unclear etiology.  06/20/14 BAL showed 60% macrophages and 30% PMNs. Her AFB smear, fungal smear, legionella Ab, pneumocystis smear, ACE, and sputum culture are negative. Her AFB, legionella, fungal, and BAL culture as well as her fungal Ab, hypersensitivity pneumonitis panel were all negative Serology: DsDNA, RF, ANCA, HSP all negative  RHC 10/26/2014  RA = 2 RV = 27/1/5 PA = 29/7 (18) PCW = 4 Fick cardiac output/index = 4.5/2.2 PVR = 3.0 WU FA sat = 98% PA sat = 64%, 61%  Labs (10/15): TSH normal K 3.6, creatinine 1.1 Labs (10/31/2014) L k 3.9 Creatinine 1.06  Labs 07/23/2015: K 3.8 Creatinine 0.82  Labs (3/17): K 4, creatinine 0.6 Labs (5/17): K 4.1, creatinine 0.7 Labs (1/18): K 3.6, creatinine 0.42 Labs (2/19): K 3.5, creatinine 0.77 Labs (6/19): K 3.8c, creatinine 0.77  Review of systems complete and found to be negative unless listed  in HPI.   Past Medical History:  Diagnosis Date  . Anxiety   . Arthritis    rheumatoid arthritis- mild, no rheumatology care   . Asthma    seasonal allergies   . Asymptomatic LV dysfunction    a. Echo in Dec 2011 with EF 35 to 40%. Felt to be due to paced rhythm. b. EF 25-30% in 07/2014.  . Bipolar affective disorder (HCC)   . Cardiac pacemaker    a. Since age 45 in 71. b. Upgrade to BiV in 2013.  [name] Carpal  tunnel syndrome of right wrist   . CHF (congestive heart failure) (HCC)   . Congenital complete AV block   . Cor pulmonale, chronic (HCC) 04/08/2018  . Depression    bipolar  . Diabetes mellitus without complication (HCC)   . GERD (gastroesophageal reflux disease)   . Hypertension   . Interstitial lung disease (HCC)   . Lupus (HCC)   . Lupus (systemic lupus erythematosus) (HCC)   . Obesity   . Pneumonitis    a. a/w hypoxia - inflammatory - large workup 07/2014.  . Presence of permanent cardiac pacemaker   . Seizures (HCC)    as a child- from high fever  . Sinus tachycardia     Current Outpatient Medications  Medication Sig Dispense Refill  . dicyclomine (BENTYL) 20 MG tablet Take 20 mg by mouth 4 (four) times daily -  before meals and at bedtime.     . Etonogestrel (NEXPLANON Mc[place]ain) Inject 1 application into the skin once.     . furosemide (LASIX) 20 MG tablet TAKE 1 TABLET BY MOUTH EVERY DAY (Patient taking differently: Take 20 mg by mouth daily. ) 90 tablet 3  . guaiFENesin-dextromethorphan (ROBITUSSIN DM) 100-10 MG/5ML syrup Take 5 mLs by mouth every 4 (four) hours as needed for cough. 118 mL 0  . LANTUS SOLOSTAR 100 UNIT/ML Solostar Pen INJECT 15 UNITS SUBCUTANEOUSLY EVERY DAY (Patient taking differently: Inject 15 Units into the skin daily as needed (High blood sugar). ) 15 pen 2  . metFORMIN (GLUCOPHAGE) 1000 MG tablet Take 1 tablet (1,000 mg total) by mouth 2 (two) times daily with a meal. 90 tablet 3  . mycophenolate (CELLCEPT) 500 MG tablet Take 1,000 mg by mouth 2 (two) times daily.     [name] NOVOLOG FLEXPEN 100 UNIT/ML FlexPen INJECT 8 UNITS INTO THE SKIN 3 (THREE) TIMES DAILY WITH MEALS. (Patient taking differently: Inject 8 Units into the skin 3 (three) times daily with meals. ) 15 pen 5  . OXYGEN Inhale 2-4 L into the lungs.     . potassium chloride SA (K-DUR,KLOR-CON) 20 MEQ tablet TAKE 2 TABLETS BY MOUTH ONCE DAILY (Patient taking differently: Take 20 mEq by mouth once. ) 60  tablet 3  . predniSONE (DELTASONE) 2.5 MG tablet Take 50mg  (5 tablets) per day for 5 days.Then take 40mg  (4 tablets) per day for 3 days.Then take 30mg  (3 tablets) per day for 3 days.Then take 20mg  (2 tablets) per day for 3 days.Then take your home dose of 7.5mg /day continuously. 52 tablet 0  . tacrolimus (PROGRAF) 0.5 MG capsule Take 1.5 mg by mouth 2 (two) times daily.    . traZODone (DESYREL) 50 MG tablet Take 1 tablet (50 mg total) by mouth at bedtime as needed for sleep. 30 tablet 3  . fluticasone (FLONASE) 50 MCG/ACT nasal spray Place 1 spray into both nostrils daily as needed for allergies or rhinitis.    . furosemide (LASIX) 40 MG tablet  TAKE 1 TABLET BY MOUTH ONCE DAILY (Patient not taking: Reported on 08/02/2018) 30 tablet 5  . metoprolol succinate (TOPROL XL) 25 MG 24 hr tablet Take 1 tablet (25 mg total) by mouth at bedtime. 30 tablet 11   No current facility-administered medications for this encounter.     Allergies  Allergen Reactions  . 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 Sertraline Hcl Hives  . Tape Other (See Comments)    Burns skin      Social History   Socioeconomic History  . Marital status: Single    Spouse name: Not on file  . Number of children: 1  . Years of education: Not on file  . Highest education level: Not on file  Occupational History  . Occupation: CNA  Social Needs  . Financial resource strain: Not on file  . Food insecurity:    Worry: Not on file    Inability: Not on file  . Transportation needs:    Medical: Not on file    Non-medical: Not on file  Tobacco Use  . Smoking status: Former Smoker    Packs/day: 0.25    Years: 0.50    Pack years: 0.12    Types: Cigarettes    Last attempt to quit: 07/25/1996    Years since quitting: 22.0  . Smokeless tobacco: Never Used  Substance and Sexual Activity  . Alcohol use: No  . Drug use: No  . Sexual activity: Yes    Birth  control/protection: Implant  Lifestyle  . Physical activity:    Days per week: Not on file    Minutes per session: Not on file  . Stress: Not on file  Relationships  . Social connections:    Talks on phone: Not on file    Gets together: Not on file    Attends religious service: Not on file    Active member of club or organization: Not on file    Attends meetings of clubs or organizations: Not on file    Relationship status: Not on file  . Intimate partner violence:    Fear of current or ex partner: Not on file    Emotionally abused: Not on file    Physically abused: Not on file    Forced sexual activity: Not on file  Other Topics Concern  . Not on file  Social History Narrative   Works at [employer].        Family History  Problem Relation Age of Onset  . Heart disease Mother        CHF (no details)  . Hypertension Mother   . Lupus Mother   . Heart disease Father        Murmur  . Heart disease Sister 80        No details.  History of a pacemaker    Vitals:   08/02/18 0950  BP: 140/82  Pulse: 80  SpO2: 95%  Weight: 63.2 kg (139 lb 6.4 oz)   Wt Readings from Last 3 Encounters:  08/02/18 63.2 kg (139 lb 6.4 oz)  07/26/18 64 kg (141 lb 1.6 oz)  07/17/18 63.8 kg (140 lb 10.5 oz)    PHYSICAL EXAM: General: No resp difficulty. HEENT: Normal Neck: Supple. JVP flat. Carotids 2+ bilat; no bruits. No thyromegaly or nodule noted. Cor: PMI nondisplaced. RRR, No M/G/R noted Lungs: CTAB, normal effort. No wheezing. + dry cough. On 2 L O2 Abdomen:  Soft, non-tender, non-distended, no HSM. No bruits or masses. +BS  Extremities: No cyanosis, clubbing, or rash. R and LLE no edema.  Neuro: Alert & orientedx3, cranial nerves grossly intact. moves all 4 extremities w/o difficulty. Affect pleasant  EKG: atrial sensed, ventricular paced with occasional PVCs 80 bpm. Personally reviewed.   ASSESSMENT & PLAN: 1) Chronic systolic HF:  Echo 05/2014 EF 20-25%, echo 1/19 improved with EF  45-50% and mild-moderately decreased systolic function. Nonischemic cardiomyopathy.  Medtronic CRT-P device.  NYHA class III. Volume stable on exam. - Continue Lasix 20 mg daily.   - Restart Toprol XL 25 mg daily (previously on 25 mg BID) 2) Anti-synthetase syndrome with interstitial lung disease: Suspect this also causes the persistent weakness.  She is followed by rheumatology and is on Cellcept, tacrolimus, and prednisone.  Sees rheumatology Thursday. 3) Congenital AVN block s/p pacer: Follows with Dr Ladona Ridgel 4) Cough - Has chronic cough, but had productive green sputum starting today. No fever or chills. Lungs are clear. Encouraged her to follow up with PCP if she continues to have green sputum or if she develops fever/chills.   Restart Toprol 25 mg daily Keep follow up with Dr Shirlee Latch 11/11.  Alford Highland, NP 08/02/2018  Greater than 50% of the 25 minute visit was spent in counseling/coordination of care regarding disease state education, salt/fluid restriction, sliding scale diuretics, and medication compliance.

## 2018-07-29 ENCOUNTER — Other Ambulatory Visit (HOSPITAL_COMMUNITY): Payer: Self-pay | Admitting: Cardiology

## 2018-08-02 ENCOUNTER — Ambulatory Visit (HOSPITAL_COMMUNITY)
Admission: RE | Admit: 2018-08-02 | Discharge: 2018-08-02 | Disposition: A | Discharge status: Home / Self Care | Payer: Medicare Other | Source: Ambulatory Visit | Attending: Cardiology | Admitting: Cardiology

## 2018-08-02 VITALS — BP 140/82 | HR 80 | Wt 139.4 lb

## 2018-08-02 DIAGNOSIS — J9611 Chronic respiratory failure with hypoxia: Secondary | ICD-10-CM

## 2018-08-02 DIAGNOSIS — Z794 Long term (current) use of insulin: Secondary | ICD-10-CM | POA: Insufficient documentation

## 2018-08-02 DIAGNOSIS — R05 Cough: Secondary | ICD-10-CM

## 2018-08-02 DIAGNOSIS — Q246 Congenital heart block: Secondary | ICD-10-CM | POA: Diagnosis not present

## 2018-08-02 DIAGNOSIS — E119 Type 2 diabetes mellitus without complications: Secondary | ICD-10-CM | POA: Diagnosis not present

## 2018-08-02 DIAGNOSIS — G5601 Carpal tunnel syndrome, right upper limb: Secondary | ICD-10-CM | POA: Insufficient documentation

## 2018-08-02 DIAGNOSIS — R059 Cough, unspecified: Secondary | ICD-10-CM

## 2018-08-02 DIAGNOSIS — Z950 Presence of cardiac pacemaker: Secondary | ICD-10-CM | POA: Diagnosis not present

## 2018-08-02 DIAGNOSIS — I110 Hypertensive heart disease with heart failure: Secondary | ICD-10-CM | POA: Insufficient documentation

## 2018-08-02 DIAGNOSIS — I5022 Chronic systolic (congestive) heart failure: Secondary | ICD-10-CM | POA: Insufficient documentation

## 2018-08-02 DIAGNOSIS — J849 Interstitial pulmonary disease, unspecified: Secondary | ICD-10-CM | POA: Insufficient documentation

## 2018-08-02 DIAGNOSIS — Z87891 Personal history of nicotine dependence: Secondary | ICD-10-CM | POA: Diagnosis not present

## 2018-08-02 DIAGNOSIS — Z79899 Other long term (current) drug therapy: Secondary | ICD-10-CM | POA: Insufficient documentation

## 2018-08-02 DIAGNOSIS — K219 Gastro-esophageal reflux disease without esophagitis: Secondary | ICD-10-CM | POA: Diagnosis not present

## 2018-08-02 DIAGNOSIS — F319 Bipolar disorder, unspecified: Secondary | ICD-10-CM | POA: Diagnosis not present

## 2018-08-02 DIAGNOSIS — Z8249 Family history of ischemic heart disease and other diseases of the circulatory system: Secondary | ICD-10-CM | POA: Insufficient documentation

## 2018-08-02 MED ORDER — METOPROLOL SUCCINATE ER 25 MG PO TB24: 25.0000 mg | ORAL_TABLET | Freq: Every day | ORAL | 11 refills | Status: DC

## 2018-08-02 NOTE — Patient Instructions (Signed)
RESTART Toprol 25 mg, one tab daily at bedtime  Keep your follow up as scheduled in 4 weeks with Dr Shirlee Latch   Do the following things EVERYDAY: 1) Weigh yourself in the morning before breakfast. Write it down and keep it in a log. 2) Take your medicines as prescribed 3) Eat low salt foods-Limit salt (sodium) to 2000 mg per day.  4) Stay as active as you can everyday 5) Limit all fluids for the day to less than 2 liters

## 2018-08-05 DIAGNOSIS — I5020 Unspecified systolic (congestive) heart failure: Secondary | ICD-10-CM | POA: Diagnosis not present

## 2018-08-05 DIAGNOSIS — R197 Diarrhea, unspecified: Secondary | ICD-10-CM | POA: Diagnosis not present

## 2018-08-05 DIAGNOSIS — Z87440 Personal history of urinary (tract) infections: Secondary | ICD-10-CM | POA: Diagnosis not present

## 2018-08-05 DIAGNOSIS — R21 Rash and other nonspecific skin eruption: Secondary | ICD-10-CM | POA: Diagnosis not present

## 2018-08-05 DIAGNOSIS — Z79899 Other long term (current) drug therapy: Secondary | ICD-10-CM | POA: Diagnosis not present

## 2018-08-05 DIAGNOSIS — J849 Interstitial pulmonary disease, unspecified: Secondary | ICD-10-CM | POA: Diagnosis not present

## 2018-08-05 DIAGNOSIS — M25531 Pain in right wrist: Secondary | ICD-10-CM | POA: Diagnosis not present

## 2018-08-05 DIAGNOSIS — D8989 Other specified disorders involving the immune mechanism, not elsewhere classified: Secondary | ICD-10-CM | POA: Diagnosis not present

## 2018-08-05 DIAGNOSIS — Z8619 Personal history of other infectious and parasitic diseases: Secondary | ICD-10-CM | POA: Diagnosis not present

## 2018-08-05 DIAGNOSIS — M6019 Interstitial myositis, multiple sites: Secondary | ICD-10-CM | POA: Diagnosis not present

## 2018-08-05 DIAGNOSIS — R110 Nausea: Secondary | ICD-10-CM | POA: Diagnosis not present

## 2018-08-05 DIAGNOSIS — Z9981 Dependence on supplemental oxygen: Secondary | ICD-10-CM | POA: Diagnosis not present

## 2018-08-05 DIAGNOSIS — R109 Unspecified abdominal pain: Secondary | ICD-10-CM | POA: Diagnosis not present

## 2018-08-06 DIAGNOSIS — M6019 Interstitial myositis, multiple sites: Secondary | ICD-10-CM | POA: Diagnosis not present

## 2018-08-06 DIAGNOSIS — D8989 Other specified disorders involving the immune mechanism, not elsewhere classified: Secondary | ICD-10-CM | POA: Diagnosis not present

## 2018-08-06 DIAGNOSIS — Z79899 Other long term (current) drug therapy: Secondary | ICD-10-CM | POA: Diagnosis not present

## 2018-08-09 ENCOUNTER — Ambulatory Visit: Payer: Medicare Other | Admitting: Family Medicine

## 2018-08-18 ENCOUNTER — Other Ambulatory Visit: Payer: Self-pay

## 2018-08-18 MED ORDER — TRAZODONE HCL 50 MG PO TABS: 50.0000 mg | ORAL_TABLET | Freq: Every evening | ORAL | 3 refills | Status: DC | PRN

## 2018-08-19 ENCOUNTER — Other Ambulatory Visit: Payer: Self-pay

## 2018-08-19 MED ORDER — TRAZODONE HCL 50 MG PO TABS: 50.0000 mg | ORAL_TABLET | Freq: Every evening | ORAL | 3 refills | Status: DC | PRN

## 2018-08-27 ENCOUNTER — Ambulatory Visit: Payer: Medicare Other | Admitting: Pulmonary Disease

## 2018-08-30 ENCOUNTER — Encounter (HOSPITAL_COMMUNITY): Payer: Self-pay | Admitting: Cardiology

## 2018-08-30 ENCOUNTER — Ambulatory Visit (HOSPITAL_COMMUNITY)
Admission: RE | Admit: 2018-08-30 | Discharge: 2018-08-30 | Disposition: A | Discharge status: Home / Self Care | Payer: Medicare Other | Source: Ambulatory Visit | Attending: Cardiology | Admitting: Cardiology

## 2018-08-30 ENCOUNTER — Other Ambulatory Visit: Payer: Self-pay

## 2018-08-30 VITALS — BP 91/50 | HR 108 | Wt 130.0 lb

## 2018-08-30 DIAGNOSIS — E119 Type 2 diabetes mellitus without complications: Secondary | ICD-10-CM | POA: Diagnosis not present

## 2018-08-30 DIAGNOSIS — I442 Atrioventricular block, complete: Secondary | ICD-10-CM | POA: Insufficient documentation

## 2018-08-30 DIAGNOSIS — M609 Myositis, unspecified: Secondary | ICD-10-CM | POA: Diagnosis not present

## 2018-08-30 DIAGNOSIS — J9611 Chronic respiratory failure with hypoxia: Secondary | ICD-10-CM

## 2018-08-30 DIAGNOSIS — J849 Interstitial pulmonary disease, unspecified: Secondary | ICD-10-CM | POA: Diagnosis not present

## 2018-08-30 DIAGNOSIS — I110 Hypertensive heart disease with heart failure: Secondary | ICD-10-CM | POA: Insufficient documentation

## 2018-08-30 DIAGNOSIS — Z794 Long term (current) use of insulin: Secondary | ICD-10-CM | POA: Diagnosis not present

## 2018-08-30 DIAGNOSIS — Z87891 Personal history of nicotine dependence: Secondary | ICD-10-CM | POA: Diagnosis not present

## 2018-08-30 DIAGNOSIS — F319 Bipolar disorder, unspecified: Secondary | ICD-10-CM | POA: Insufficient documentation

## 2018-08-30 DIAGNOSIS — Z888 Allergy status to other drugs, medicaments and biological substances status: Secondary | ICD-10-CM | POA: Diagnosis not present

## 2018-08-30 DIAGNOSIS — Z4509 Encounter for adjustment and management of other cardiac device: Secondary | ICD-10-CM | POA: Insufficient documentation

## 2018-08-30 DIAGNOSIS — I5022 Chronic systolic (congestive) heart failure: Secondary | ICD-10-CM | POA: Diagnosis not present

## 2018-08-30 DIAGNOSIS — D649 Anemia, unspecified: Secondary | ICD-10-CM | POA: Insufficient documentation

## 2018-08-30 DIAGNOSIS — K219 Gastro-esophageal reflux disease without esophagitis: Secondary | ICD-10-CM | POA: Insufficient documentation

## 2018-08-30 DIAGNOSIS — J45909 Unspecified asthma, uncomplicated: Secondary | ICD-10-CM | POA: Diagnosis not present

## 2018-08-30 DIAGNOSIS — I428 Other cardiomyopathies: Secondary | ICD-10-CM | POA: Diagnosis not present

## 2018-08-30 DIAGNOSIS — Z7952 Long term (current) use of systemic steroids: Secondary | ICD-10-CM | POA: Insufficient documentation

## 2018-08-30 DIAGNOSIS — Z8249 Family history of ischemic heart disease and other diseases of the circulatory system: Secondary | ICD-10-CM | POA: Insufficient documentation

## 2018-08-30 DIAGNOSIS — M069 Rheumatoid arthritis, unspecified: Secondary | ICD-10-CM | POA: Insufficient documentation

## 2018-08-30 DIAGNOSIS — I5032 Chronic diastolic (congestive) heart failure: Secondary | ICD-10-CM | POA: Diagnosis not present

## 2018-08-30 DIAGNOSIS — F419 Anxiety disorder, unspecified: Secondary | ICD-10-CM | POA: Insufficient documentation

## 2018-08-30 DIAGNOSIS — E669 Obesity, unspecified: Secondary | ICD-10-CM | POA: Diagnosis not present

## 2018-08-30 LAB — CBC
HCT: 40.1 % (ref 36.0–46.0)
Hemoglobin: 10.8 g/dL — ABNORMAL LOW (ref 12.0–15.0)
MCH: 22.1 pg — ABNORMAL LOW (ref 26.0–34.0)
MCHC: 26.9 g/dL — AB (ref 30.0–36.0)
MCV: 82 fL (ref 80.0–100.0)
Platelets: 355 10*3/uL (ref 150–400)
RBC: 4.89 MIL/uL (ref 3.87–5.11)
RDW: 17.5 % — ABNORMAL HIGH (ref 11.5–15.5)
WBC: 5.1 10*3/uL (ref 4.0–10.5)
nRBC: 0 % (ref 0.0–0.2)

## 2018-08-30 LAB — IRON AND TIBC
IRON: 49 ug/dL (ref 28–170)
SATURATION RATIOS: 13 % (ref 10.4–31.8)
TIBC: 371 ug/dL (ref 250–450)
UIBC: 322 ug/dL

## 2018-08-30 LAB — FERRITIN: Ferritin: 39 ng/mL (ref 11–307)

## 2018-08-30 NOTE — Patient Instructions (Signed)
Labs today We will only contact you if something comes back abnormal or we need to make some changes. Otherwise no news is good news!  Your physician has requested that you have an echocardiogram. Echocardiography is a painless test that uses sound waves to create images of your heart. It provides your doctor with information about the size and shape of your heart and how well your heart's chambers and valves are working. This procedure takes approximately one hour. There are no restrictions for this procedure.  Your physician recommends that you schedule a follow-up appointment in: 3 months with Dr. Shirlee Latch

## 2018-08-30 NOTE — Progress Notes (Signed)
Patient ID: Makayla Vasquez, female   DOB: June 24, 1981, 37 y.o.   MRN: [id number] PCP: Wendee Beavers, DO Cardiology: Dr Shirlee Latch  HPI: Ms Hechler is 37 y.o. 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   She has a h/o congenital HB and underwent first PPM at age 1. In 2011 had ECHO with EF 35-40%. LV dysfunction felt to be due to RV pacing so upgraded to CRT-P. Echo in 10/15 with EF 25%.  She also has concomitant lung disease and this has limited b-blocker dosing.  She follows with Dr. Maple Hudson for her lung disease. She is a former smoker.  Admitted in 8/15 and 9/15 with worsening respiratory status and severe hypoxia. Had extensive w/u. Thought to have a viral pneumonitis complicated by atelectasis and mild edema vs. an autoimmune process. Discharged home on a course of levaquin and prednisone.   She was offered a lung biopsy for further diagnosis versus empiric treatment with steroids. She opted for empiric steroid treatment. She is followed by a rheumatologist and is on Cellcept, prednisone, and tacrolimus.  She is on oxygen and night and with activity.   Admission in 5/17 for ?sepsis and management of interstitial lung disease.     She was admitted with PNA in 1/18.  Lasix, losartan, and bisoprolol stopped.   Last echo in 1/19 showed EF 45-50%, with mild to moderately decreased RV systolic function and moderate MR.    She returns for followup of CHF and ILD. She has stable dyspnea with moderate exertion, notes it doing chores around the house.  She wears oxygen at all times. She has a chronic cough.  Occasional palpitations.  Occasional lightheadedness with standing.    Medtronic device interrogation: Fluid index recently > threshold but now back to baseline.  90+% BiV pacing.  1 episode of possible atrial fibrillation lasting for about an hour.    PFTs (12/15) showed a severe  restrictive defect concerning for an interstitial process.   EP study 10/15 for persistent tachycardia and found to have sinus tachycardia.   06/16/14 CT scan showed bilateral progressive lower peribronchial infiltrates with groundglass changes and nodular patter consistent with acute pneumonitis of unclear etiology.  06/20/14 BAL showed 60% macrophages and 30% PMNs. Her AFB smear, fungal smear, legionella Ab, pneumocystis smear, ACE, and sputum culture are negative. Her AFB, legionella, fungal, and BAL culture as well as her fungal Ab, hypersensitivity pneumonitis panel were all negative Serology: DsDNA, RF, ANCA, HSP all negative.  Anti-Jo-1 Ab positive.   RHC 10/26/2014  RA = 2 RV = 27/1/5 PA = 29/7 (18) PCW = 4 Fick cardiac output/index = 4.5/2.2 PVR = 3.0 WU FA sat = 98% PA sat = 64%, 61%  Labs (10/15): TSH normal K 3.6, creatinine 1.1 Labs (10/31/2014) L k 3.9 Creatinine 1.06  Labs 07/23/2015: K 3.8 Creatinine 0.82  Labs (3/17): K 4, creatinine 0.6 Labs (5/17): K 4.1, creatinine 0.7 Labs (1/18): K 3.6, creatinine 0.42 Labs (2/19): K 3.5, creatinine 0.77 Labs (6/19): K 3.8c, creatinine 0.77 Labs (10/19): hgb 7.5, CK 1254, K 3.7, creatinine 0.55  ECG (personally reviewed): NSR, BiV pacing  Review of Systems: All systems reviewed and negative except as per HPI.   Past Medical History:  Diagnosis Date  . Anxiety   . Arthritis    rheumatoid arthritis- mild, no rheumatology care   . Asthma    seasonal allergies   .  Asymptomatic LV dysfunction    a. Echo in Dec 2011 with EF 35 to 40%. Felt to be due to paced rhythm. b. EF 25-30% in 07/2014.  . Bipolar affective disorder (HCC)   . Cardiac pacemaker    a. Since age 37 in 49. b. Upgrade to BiV in 2013.  [name] Carpal tunnel syndrome of right wrist   . CHF (congestive heart failure) (HCC)   . Congenital complete AV block   . Cor pulmonale, chronic (HCC) 04/08/2018  . Depression    bipolar  . Diabetes mellitus without complication  (HCC)   . GERD (gastroesophageal reflux disease)   . Hypertension   . Interstitial lung disease (HCC)   . Lupus (HCC)   . Lupus (systemic lupus erythematosus) (HCC)   . Obesity   . Pneumonitis    a. a/w hypoxia - inflammatory - large workup 07/2014.  . Presence of permanent cardiac pacemaker   . Seizures (HCC)    as a child- from high fever  . Sinus tachycardia     Current Outpatient Medications  Medication Sig Dispense Refill  . dicyclomine (BENTYL) 20 MG tablet Take 20 mg by mouth 4 (four) times daily -  before meals and at bedtime.     . Etonogestrel (NEXPLANON [place]) Inject 1 application into the skin once.     . fluticasone (FLONASE) 50 MCG/ACT nasal spray Place 1 spray into both nostrils daily as needed for allergies or rhinitis.    . furosemide (LASIX) 20 MG tablet TAKE 1 TABLET BY MOUTH EVERY DAY 90 tablet 3  . furosemide (LASIX) 40 MG tablet TAKE 1 TABLET BY MOUTH ONCE DAILY 30 tablet 5  . guaiFENesin-dextromethorphan (ROBITUSSIN DM) 100-10 MG/5ML syrup Take 5 mLs by mouth every 4 (four) hours as needed for cough. 118 mL 0  . LANTUS SOLOSTAR 100 UNIT/ML Solostar Pen INJECT 15 UNITS SUBCUTANEOUSLY EVERY DAY (Patient taking differently: Inject 15 Units into the skin daily as needed (High blood sugar). ) 15 pen 2  . metFORMIN (GLUCOPHAGE) 1000 MG tablet Take 1 tablet (1,000 mg total) by mouth 2 (two) times daily with a meal. 90 tablet 3  . metoprolol succinate (TOPROL XL) 25 MG 24 hr tablet Take 1 tablet (25 mg total) by mouth at bedtime. 30 tablet 11  . mycophenolate (CELLCEPT) 500 MG tablet Take 1,000 mg by mouth 2 (two) times daily.     [name] NOVOLOG FLEXPEN 100 UNIT/ML FlexPen INJECT 8 UNITS INTO THE SKIN 3 (THREE) TIMES DAILY WITH MEALS. (Patient taking differently: Inject 8 Units into the skin 3 (three) times daily with meals. ) 15 pen 5  . OXYGEN Inhale 2-4 L into the lungs.     . potassium chloride SA (K-DUR,KLOR-CON) 20 MEQ tablet Take 20 mEq by mouth daily.    . predniSONE  (DELTASONE) 2.5 MG tablet Take 50mg  (5 tablets) per day for 5 days.Then take 40mg  (4 tablets) per day for 3 days.Then take 30mg  (3 tablets) per day for 3 days.Then take 20mg  (2 tablets) per day for 3 days.Then take your home dose of 7.5mg /day continuously. 52 tablet 0  . tacrolimus (PROGRAF) 0.5 MG capsule Take 1.5 mg by mouth 2 (two) times daily.    . traZODone (DESYREL) 50 MG tablet Take 1 tablet (50 mg total) by mouth at bedtime as needed for sleep. 30 tablet 3   No current facility-administered medications for this encounter.     Allergies  Allergen Reactions  . Atorvastatin Other (See Comments)  Possible statin induced myopathy with elevated CK on atrovastatin 40  . Sertraline Hcl Hives  . Tape Other (See Comments)    Burns skin      Social History   Socioeconomic History  . Marital status: Single    Spouse name: Not on file  . Number of children: 1  . Years of education: Not on file  . Highest education level: Not on file  Occupational History  . Occupation: CNA  Social Needs  . Financial resource strain: Not on file  . Food insecurity:    Worry: Not on file    Inability: Not on file  . Transportation needs:    Medical: Not on file    Non-medical: Not on file  Tobacco Use  . Smoking status: Former Smoker    Packs/day: 0.25    Years: 0.50    Pack years: 0.12    Types: Cigarettes    Last attempt to quit: 07/25/1996    Years since quitting: 22.1  . Smokeless tobacco: Never Used  Substance and Sexual Activity  . Alcohol use: No  . Drug use: No  . Sexual activity: Yes    Birth control/protection: Implant  Lifestyle  . Physical activity:    Days per week: Not on file    Minutes per session: Not on file  . Stress: Not on file  Relationships  . Social connections:    Talks on phone: Not on file    Gets together: Not on file    Attends religious service: Not on file    Active member of club or organization: Not on file    Attends meetings of clubs or  organizations: Not on file    Relationship status: Not on file  . Intimate partner violence:    Fear of current or ex partner: Not on file    Emotionally abused: Not on file    Physically abused: Not on file    Forced sexual activity: Not on file  Other Topics Concern  . Not on file  Social History Narrative   Works at [employer].        Family History  Problem Relation Age of Onset  . Heart disease Mother        CHF (no details)  . Hypertension Mother   . Lupus Mother   . Heart disease Father        Murmur  . Heart disease Sister 21        No details.  History of a pacemaker    Vitals:   08/30/18 1008  BP: (!) 91/50  Pulse: (!) 108  SpO2: 94%  Weight: 59 kg (130 lb)    PHYSICAL EXAM: General: NAD Neck: No JVD, no thyromegaly or thyroid nodule.  Lungs: Occasional crackles CV: Nondisplaced PMI.  Heart mildly tachy, regular S1/S2, no S3/S4, no murmur.  No peripheral edema.  No carotid bruit.  Normal pedal pulses.  Abdomen: Soft, nontender, no hepatosplenomegaly, no distention.  Skin: Intact without lesions or rashes.  Neurologic: Alert and oriented x 3.  Psych: Normal affect. Extremities: No clubbing or cyanosis.  HEENT: Normal.   ASSESSMENT & PLAN: 1) Chronic systolic HF:  Echo 05/2014 EF 20-25%, echo 1/19 improved with EF 45-50% and mild-moderately decreased systolic function. Nonischemic cardiomyopathy.  Medtronic CRT-P device, BiV pacing > 90% of the time on interrogation today.  She does not look volume overloaded on exam.  Optivol suggested recent volume overload but now back to baseline.  - Continue Lasix 20  mg daily.   - Continue Toprol XL 25 mg qhs.  - BP is low, she is unlikely to tolerate additional BP-active medications.   - Repeat echo in 1/20.  2) Anti-synthetase syndrome with interstitial lung disease: This is associated with elevated ant-Jo1 Ab, ILD, and myositis.  CK has been persistently elevated.  She is followed by rheumatology and is on Cellcept,  tacrolimus, and prednisone.   3) Congenital AVN block s/p pacer: Has EP followup.  4) Anemia: Check CBC today and send Fe studies.   5)?Atrial fibrillation: There was an episode of possible atrial fibrillation noted on device, lasted for about 1 hour, no recurrence. There is no evidence for anticoagulating such a low atrial fibrillation burden.  Will continue to monitor.   Followup in  1/20 with echo.   [name] 08/30/2018

## 2018-09-02 ENCOUNTER — Other Ambulatory Visit (HOSPITAL_COMMUNITY): Payer: Self-pay

## 2018-09-02 MED ORDER — POTASSIUM CHLORIDE CRYS ER 20 MEQ PO TBCR: 20.0000 meq | EXTENDED_RELEASE_TABLET | Freq: Every day | ORAL | 11 refills | Status: DC

## 2018-09-02 NOTE — Progress Notes (Signed)
@Patient  ID: Makayla Vasquez, female    DOB: 11/11/1980, 37 y.o.   MRN: [id number]  Chief Complaint  Patient presents with  . Follow-up    sob     Referring provider: [place] Bing, DO  HPI:  37 year old female former smoker followed in our office for  ILD, asthmatic bronchitis, upper airway cough syndrome    PMH: lupus, cardiomyopathy, congenital complete heart block s/p pacemaker placement.  Smoker/ Smoking History: Former Smoker  Maintenance:  Cellcept, Prograf, Prednisone 7.27m daily  Pt of: Makayla Vasquez Recent [facility] Encounters:   06/18/2018- office visit- [facility] Patient was recently seen in the emergency room with a dry cough.  Patient was started on azithromycin and told to follow-up with PCP.  Patient presents today with complaints of cough for 3 weeks.  Reports she typically gets symptoms after eating something spicy.  Cough is typically dry.  Occasionally she will bring up yellow mucus.  Patient continues to take prednisone 7.5 mg daily per rheumatology.  Patient continues to be on CellCept as well as Prograf.  Taking 40 mg Lasix daily this is managed by Makayla. MAundra Dubinher cardiologist. Plan: Continue home O2 baseline 2 to 4 L, start omeprazole for acid reflux, continue follow-up with rheumatology, continue prednisone 7.5 mg daily, continue Prograf, continue CellCept, continue Bactrim Monday Wednesday Friday, FeNO today is 11, chest x-ray today unchanged from previous, sputum culture, CBC with differential today follow-up in 2 weeks   09/03/2018  - Visit   37year old female patient presenting today for follow-up visit.  Patient arrived to our office on 4 L pulse via her POC oxygen saturations were 70% heart rate was 121.  Patient reports that she traveled all the way to our office on room air and just started her 4 L via POC about 10 minutes ago.  Patient did not think that it would be a problem going from 4 L continuous at her home to room air.  Patient reports that she  is had progressive increased shortness of breath and dyspnea over the last 2 to 3 months.  Patient has been on 4 L continuous at home for the last 2 to 3 months.  Patient reports that her cardiologist stated she needed to follow[place] with [place]  Patient is also had for the last 1 week cold-like symptoms, subjective fevers, cough with green thick yellow productive mucus.  She continues to have daily epigastric pain and acid reflux.  Patient never started omeprazole she was previously instructed in our office in August/2019 by Makayla Vasquez.     Tests:  07/24/2018-chest x-ray-lung disease no acute abnormality, diffuse hazy opacities consistent with chronic interstitial lung disease which is unchanged   04/08/18-CT Angio- stable 6 mm left lower lobe pulmonary nodule dating back to 2017, dilated main pulmonary artery, redemonstration of cardia megaly and diffuse pulmonary vascular congestion findings likely reflect component of chronic CHF  07/15/18-echocardiogram-LV ejection fraction 509% grade 1 diastolic dysfunction, PA P pressure elevated at 60   FENO:  No results found for: NITRICOXIDE  PFT: PFT Results Latest Ref Rng & Units 12/23/2016 09/26/2014  FVC-Pre L 1.82 1.61  FVC-Predicted Pre % 56 45  FVC-Post L 1.60 1.65  FVC-Predicted Post % 49 47  Pre FEV1/FVC % % 91 94  Post FEV1/FCV % % 99 94  FEV1-Pre L 1.66 1.52  FEV1-Predicted Pre % 61 51  FEV1-Post L 1.58 1.56  DLCO UNC% % 21 32  DLCO COR %Predicted % 50  70  TLC L 2.57 3.52  TLC % Predicted % 49 64  RV % Predicted % 54 117    Imaging: No results found.  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 Presenting today for swelling and persistent pain in her R wrist.  Appears swollen arm and elevated CK related to recent flare of disease. Improved w/prednisone and currently tapering, right wrist remains exquisitely tender and painful. Given her immunosuppression, have concern for osteo in the wrist.   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 Other etiology. Check CK given strong correlation w/disease.  -CK today -continue prednisone taper plan  -Continue with Cellcept 1000 mg BID and Tacrolimus 1.5 mg BID  Right wrist pain and swelling (new to this examiner) As above.  -xray wrist (preference to complete at outside facility), encouraged to complete asap -would expect to see signs of osteo given long standing symptoms  -If xray unremarkable will order MRI to evaluate for osteomyelitits   High risk medication use -recent infection/sepsis and [facility]. Has resolution of symptoms, but had interruption in immunosuppression -recent outside labs reviewed -cont CBC LFT Cr q3 months  ILD (interstitial lung disease) ([place]) Pt reporting persistent SOB and using more O2 w/exertion. Chronic cough + sputum production. No concern for ILD flare. Following w/pulm in [place]. Wearing 5L O2 at visit today  High risk medication use -cont w/labs (CBC LFT Cr) q3 months -encouraged to resume bactrim MWF   Plan:  Return prn pending labs and imaging. Min return in 3 month for follow up if not sooner.     Specialty Problems      Pulmonary Problems   Acute respiratory failure with hypoxia (HCC)   ILD (interstitial lung disease) [facility])    Rheumatology (Makayla Vasquez, [facility]) Last seen 08/16/15 # ILD with other overlapping syndrome - +ANA SSA JO-1, ESR 55, CRP 73, normal CBC, neg RF, normal ACE & ANCA, ILD dx 2014, no biopsy, off of prednisone chronically - History with hand rash 03/2015 w/o ulceration or Raynauds, positive SICCA  symptoms - Now back on prednisone 10 daily,  Tolerating Cellcept 584m BID (decreased due to stomach irritation) Plan:  - Continue prednisone 10 daily x 3 months - Increase CellCept to 7544mBID - Increase activity, strengthening - Follow-up 4 weeks      Chronic respiratory failure with hypoxia (HCC)    Walk test on room air: 02/09/15: 98%, 92%, 91%, 90% at rest on room air. Pulse rate ranged 74-107.      Interstitial lung disease (HCC)   Upper airway cough syndrome      Allergies  Allergen Reactions  . 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 Sertraline Hcl Hives  . Tape Other (See Comments)    Burns skin    Immunization History  Administered Date(s) Administered  . Influenza Split 12/25/2011, 09/27/2012  . Influenza,inj,Quad PF,6+ Mos 10/28/2013, 08/07/2014, 09/29/2016, 07/16/2018  . Influenza-Unspecified 08/20/2015  . PPD Test 05/18/2012, 09/10/2012, 03/27/2014, 02/12/2015, 03/03/2017  . Pneumococcal Polysaccharide-23 02/16/2015  . Td 03/20/2006    Past Medical History:  Diagnosis Date  . Anxiety   . Arthritis    rheumatoid  arthritis- mild, no rheumatology care   . Asthma    seasonal allergies   . Asymptomatic LV dysfunction    a. Echo in Dec 2011 with EF 35 to 40%. Felt to be due to paced rhythm. b. EF 25-30% in 07/2014.  . Bipolar affective disorder ([place])   . Cardiac pacemaker    a. Since age 1 in 72. b. Upgrade to BiV in 2013.  [name] Carpal tunnel syndrome of right wrist   . CHF (congestive heart failure) ([place])   . Congenital complete AV block   . Cor pulmonale, chronic ([place]) 04/08/2018  . Depression    bipolar  . Diabetes mellitus without complication ([place])   . GERD (gastroesophageal reflux disease)   . Hypertension   . Interstitial lung disease ([place])   . Lupus ([place])   . Lupus (systemic lupus erythematosus) ([place])   . Obesity   . Pneumonitis    a. a/w hypoxia - inflammatory - large  workup 07/2014.  . Presence of permanent cardiac pacemaker   . Seizures ([place])    as a child- from high fever  . Sinus tachycardia     Tobacco History: Social History   Tobacco Use  Smoking Status Former Smoker  . Packs/day: 0.25  . Years: 0.50  . Pack years: 0.12  . Types: Cigarettes  . Last attempt to quit: 07/25/1996  . Years since quitting: 22.1  Smokeless Tobacco Never Used   Counseling given: Yes  Continue to not smoke  Outpatient Encounter Medications as of 09/03/2018  Medication Sig  . dicyclomine (BENTYL) 20 MG tablet Take 20 mg by mouth 4 (four) times daily -  before meals and at bedtime.   . Etonogestrel (NEXPLANON [place]) Inject 1 application into the skin once.   . fluticasone (FLONASE) 50 MCG/ACT nasal spray Place 1 spray into both nostrils daily as needed for allergies or rhinitis.  . furosemide (LASIX) 40 MG tablet TAKE 1 TABLET BY MOUTH ONCE DAILY  . guaiFENesin-dextromethorphan (ROBITUSSIN DM) 100-10 MG/5ML syrup Take 5 mLs by mouth every 4 (four) hours as needed for cough.  [name] LANTUS SOLOSTAR 100 UNIT/ML Solostar Pen INJECT 15 UNITS SUBCUTANEOUSLY EVERY DAY (Patient taking differently: Inject 15 Units into the skin daily as needed (High blood sugar). )  . metFORMIN (GLUCOPHAGE) 1000 MG tablet Take 1 tablet (1,000 mg total) by mouth 2 (two) times daily with a meal.  . metoprolol succinate (TOPROL XL) 25 MG 24 hr tablet Take 1 tablet (25 mg total) by mouth at bedtime.  . mycophenolate (CELLCEPT) 500 MG tablet Take 1,000 mg by mouth 2 (two) times daily.   [name] NOVOLOG FLEXPEN 100 UNIT/ML FlexPen INJECT 8 UNITS INTO THE SKIN 3 (THREE) TIMES DAILY WITH MEALS. (Patient taking differently: Inject 8 Units into the skin 3 (three) times daily with meals. )  . OXYGEN Inhale 2-4 L into the lungs.   . potassium chloride SA (K-DUR,KLOR-CON) 20 MEQ tablet Take 1 tablet (20 mEq total) by mouth daily.  . predniSONE (DELTASONE) 2.5 MG tablet Take 50m (5 tablets) per day for 5 days.Then  take 464m(4 tablets) per day for 3 days.Then take 3092m3 tablets) per day for 3 days.Then take 42m21m tablets) per day for 3 days.Then take your home dose of 7.5mg/22m continuously.  . tacrolimus (PROGRAF) 0.5 MG capsule Take 1.5 mg by mouth 2 (two) times daily.  . traZODone (DESYREL) 50 MG tablet Take 1 tablet (50 mg total) by mouth at bedtime as needed for sleep.  .[name]  amoxicillin-clavulanate (AUGMENTIN) 875-125 MG tablet Take 1 tablet by mouth 2 (two) times daily.  [name] omeprazole (PRILOSEC) 20 MG capsule Take 1 capsule (20 mg total) by mouth daily.  . predniSONE (DELTASONE) 10 MG tablet 4 tabs for 2 days, then 3 tabs for 2 days, 2 tabs for 2 days, then 1 tab for 2 days, then stop  . [DISCONTINUED] furosemide (LASIX) 20 MG tablet TAKE 1 TABLET BY MOUTH EVERY DAY (Patient not taking: Reported on 09/03/2018)   No facility-administered encounter medications on file as of 09/03/2018.      Review of Systems  Review of Systems  Constitutional: Positive for fatigue. Negative for chills, fever and unexpected weight change.  HENT: Positive for congestion. Negative for ear pain, postnasal drip, sinus pressure and sinus pain.   Respiratory: Positive for cough (productive yellow / green mucous ) and shortness of breath. Negative for chest tightness and wheezing.   Cardiovascular: Negative for chest pain and palpitations.  Gastrointestinal: Negative for blood in stool, diarrhea, nausea and vomiting.       +gerd  +epigastric pain  Genitourinary: Negative for dysuria, frequency and urgency.  Musculoskeletal: Negative for arthralgias.  Skin: Negative for color change.  Allergic/Immunologic: Negative for environmental allergies and food allergies.  Neurological: Negative for dizziness, light-headedness and headaches.  Psychiatric/Behavioral: Negative for dysphoric mood. The patient is not nervous/anxious.   All other systems reviewed and are negative.    Physical Exam  BP 98/66   Pulse (!) 110   Ht  5' 5"  (1.651 m)   Wt 135 lb 6.4 oz (61.4 kg)   SpO2 100%   BMI 22.53 kg/m   Wt Readings from Last 5 Encounters:  09/03/18 135 lb 6.4 oz (61.4 kg)  08/30/18 130 lb (59 kg)  08/02/18 139 lb 6.4 oz (63.2 kg)  07/26/18 141 lb 1.6 oz (64 kg)  07/17/18 140 lb 10.5 oz (63.8 kg)    Physical Exam  Constitutional: She is oriented to person, place, and time and well-developed, well-nourished, and in no distress. No distress.  HENT:  Head: Normocephalic and atraumatic.  Right Ear: Hearing, external ear and ear canal normal.  Left Ear: Hearing, external ear and ear canal normal.  Nose: Mucosal edema present. Right sinus exhibits no maxillary sinus tenderness and no frontal sinus tenderness. Left sinus exhibits no maxillary sinus tenderness and no frontal sinus tenderness.  Mouth/Throat: Uvula is midline and oropharynx is clear and moist. No oropharyngeal exudate.  + TMs with effusions bilaterally  Eyes: Pupils are equal, round, and reactive to light.  Neck: Normal range of motion. Neck supple. No JVD present.  Cardiovascular: Normal rate, regular rhythm and normal heart sounds.  Pulmonary/Chest: Effort normal. No accessory muscle usage. No respiratory distress. She has no decreased breath sounds. She has wheezes. She has no rhonchi.  Bibasilar crackles  Abdominal: Soft. Bowel sounds are normal. There is no tenderness.  Musculoskeletal: Normal range of motion. She exhibits no edema.  Lymphadenopathy:    She has no cervical adenopathy.  Neurological: She is alert and oriented to person, place, and time. Gait normal.  Skin: Skin is warm and dry. She is not diaphoretic. No erythema.  Psychiatric: Mood, memory and judgment normal. She has a flat affect.  Nursing note and vitals reviewed.    Lab Results:  CBC    Component Value Date/Time   WBC 5.1 08/30/2018 1019   RBC 4.89 08/30/2018 1019   HGB 10.8 (L) 08/30/2018 1019   HGB 11.8 07/15/2017 1522  HCT 40.1 08/30/2018 1019   HCT 35.4  07/15/2017 1522   PLT 355 08/30/2018 1019   PLT 369 07/15/2017 1522   MCV 82.0 08/30/2018 1019   MCV 77 (L) 07/15/2017 1522   MCH 22.1 (L) 08/30/2018 1019   MCHC 26.9 (L) 08/30/2018 1019   RDW 17.5 (H) 08/30/2018 1019   RDW 16.1 (H) 07/15/2017 1522   LYMPHSABS 0.8 07/26/2018 0509   LYMPHSABS 1.3 07/15/2017 1522   MONOABS 0.4 07/26/2018 0509   EOSABS 0.5 07/26/2018 0509   EOSABS 0.1 07/15/2017 1522   BASOSABS 0.0 07/26/2018 0509   BASOSABS 0.0 07/15/2017 1522    BMET    Component Value Date/Time   NA 138 07/25/2018 0557   NA 138 07/15/2017 1522   K 3.7 07/25/2018 0557   CL 104 07/25/2018 0557   CO2 24 07/25/2018 0557   GLUCOSE 102 (H) 07/25/2018 0557   BUN 7 07/25/2018 0557   BUN 9 07/15/2017 1522   CREATININE 0.55 07/25/2018 0557   CREATININE 0.42 (L) 11/11/2016 1056   CALCIUM 8.1 (L) 07/25/2018 0557   GFRNONAA >60 07/25/2018 0557   GFRNONAA >89 11/11/2016 1056   GFRAA >60 07/25/2018 0557   GFRAA >89 11/11/2016 1056    BNP    Component Value Date/Time   BNP 259.5 (H) 07/14/2018 1535    ProBNP    Component Value Date/Time   PROBNP 11.0 07/06/2014 1431      Assessment & Plan:   37 year old female patient completing follow-up with our office today.  Unfortunately the patient has not been managing her oxygen well has been traveling and running errands on room air.  Patient did not think that this would have any effect on her breathing.  I have reviewed this with the patient we have also coordinated and contacted [employer] so that a tank was dropped off so the patient was discharged from our office on 4 L continuous which is what she needed with exertion.  Will place order to reevaluate interstitial lung disease with high-res CT, pulmonary function test, 6-minute walk.  We will have patient follow-up with [place] in 4 to 6 weeks as well as with Makayla. Janee Morn first available which should be start of 2020.  ILD (interstitial lung disease) (HCC) Prednisone 72m tablet  >>>4  tabs for 2 days, then 3 tabs for 2 days, 2 tabs for 2 days, then 1 tab for 2 days, then resume 7.5 mg daily dose >>>take with food  >>>take in the morning   Augmentin >>> Take 1 875-125 mg tablet every 12 hours for the next 7 days >>> Take with food   Hold Monday Wednesday Friday Bactrim until completing Augmentin dose then resume Monday Wednesday Friday Bactrim  We will send you home with a [employer] oxygen tank so that you can be on 4 L continuous >>> Continue 2 L - 4 L continuous to maintain oxygen saturations greater than 90%  We will order a high-res CT to further evaluate your interstitial lung disease   We will order a pulmonary function test   We will order a 6 min walk   I have also placed an order for omeprazole 20 mg tablets >>>Take one 20 mg tablet 30 minutes to an hour before your first meal the day  >>> Review GERD literature below  Follow-up in 4 to 6 weeks to ensure symptoms are improving >>> With PFT >>> Complete 6-minute walk before this appt   Follow-up with Makayla. YAnnamaria Bootsin one of his first  available slots in January/2020  Chronic respiratory failure with hypoxia ([place]) We will send you home with a [employer] oxygen tank so that you can be on 4 L continuous >>> Continue 2 L - 4 L continuous to maintain oxygen saturations greater than 90%  Follow-up with Makayla. Annamaria Vasquez in one of his first available slots in January/2020  Lupus [facility]) Continue follow-up with rheumatology  Continue CellCept Continue Continue daily 7.5 mg of prednisone after completing prednisone taper  GERD (gastroesophageal reflux disease) I have also placed an order for omeprazole 20 mg tablets >>>Take one 20 mg tablet 30 minutes to an hour before your first meal the day  >>> Review GERD literature below  Follow-up with Makayla. Annamaria Vasquez in one of his first available slots in [place], NP 09/03/2018

## 2018-09-03 ENCOUNTER — Ambulatory Visit (INDEPENDENT_AMBULATORY_CARE_PROVIDER_SITE_OTHER): Payer: Medicare Other | Admitting: Pulmonary Disease

## 2018-09-03 ENCOUNTER — Encounter: Payer: Self-pay | Admitting: Pulmonary Disease

## 2018-09-03 VITALS — BP 98/66 | HR 110 | Ht 65.0 in | Wt 135.4 lb

## 2018-09-03 DIAGNOSIS — M329 Systemic lupus erythematosus, unspecified: Secondary | ICD-10-CM | POA: Diagnosis not present

## 2018-09-03 DIAGNOSIS — J9611 Chronic respiratory failure with hypoxia: Secondary | ICD-10-CM | POA: Diagnosis not present

## 2018-09-03 DIAGNOSIS — J849 Interstitial pulmonary disease, unspecified: Secondary | ICD-10-CM | POA: Diagnosis not present

## 2018-09-03 DIAGNOSIS — K219 Gastro-esophageal reflux disease without esophagitis: Secondary | ICD-10-CM

## 2018-09-03 MED ORDER — AMOXICILLIN-POT CLAVULANATE 875-125 MG PO TABS: 1.0000 | ORAL_TABLET | Freq: Two times a day (BID) | ORAL | 0 refills | Status: DC

## 2018-09-03 MED ORDER — OMEPRAZOLE 20 MG PO CPDR: 20.0000 mg | DELAYED_RELEASE_CAPSULE | Freq: Every day | ORAL | 4 refills | Status: DC

## 2018-09-03 MED ORDER — PREDNISONE 10 MG PO TABS: ORAL_TABLET | ORAL | 0 refills | Status: DC

## 2018-09-03 NOTE — Assessment & Plan Note (Signed)
Continue follow-up with rheumatology  Continue CellCept Continue Continue daily 7.5 mg of prednisone after completing prednisone taper

## 2018-09-03 NOTE — Assessment & Plan Note (Signed)
Prednisone 10mg  tablet  >>>4 tabs for 2 days, then 3 tabs for 2 days, 2 tabs for 2 days, then 1 tab for 2 days, then resume 7.5 mg daily dose >>>take with food  >>>take in the morning   Augmentin >>> Take 1 875-125 mg tablet every 12 hours for the next 7 days >>> Take with food   Hold Monday Wednesday Friday Bactrim until completing Augmentin dose then resume Monday Wednesday Friday Bactrim  We will send you home with a [employer] oxygen tank so that you can be on 4 L continuous >>> Continue 2 L - 4 L continuous to maintain oxygen saturations greater than 90%  We will order a high-res CT to further evaluate your interstitial lung disease   We will order a pulmonary function test   We will order a 6 min walk   I have also placed an order for omeprazole 20 mg tablets >>>Take one 20 mg tablet 30 minutes to an hour before your first meal the day  >>> Review GERD literature below  Follow-up in 4 to 6 weeks to ensure symptoms are improving >>> With PFT >>> Complete 6-minute walk before this appt   Follow-up with Dr. Monday in one of his first available slots in January/2020

## 2018-09-03 NOTE — Assessment & Plan Note (Signed)
We will send you home with a [employer] oxygen tank so that you can be on 4 L continuous >>> Continue 2 L - 4 L continuous to maintain oxygen saturations greater than 90%  Follow-up with Dr. Maple Hudson in one of his first available slots in January/2020

## 2018-09-03 NOTE — Assessment & Plan Note (Signed)
I have also placed an order for omeprazole 20 mg tablets >>>Take one 20 mg tablet 30 minutes to an hour before your first meal the day  >>> Review GERD literature below  Follow-up with Dr. Maple Hudson in one of his first available slots in January/2020

## 2018-09-03 NOTE — Patient Instructions (Addendum)
Prednisone 10mg  tablet  >>>4 tabs for 2 days, then 3 tabs for 2 days, 2 tabs for 2 days, then 1 tab for 2 days, then resume 7.5 mg daily dose >>>take with food  >>>take in the morning   Augmentin >>> Take 1 875-125 mg tablet every 12 hours for the next 7 days >>> Take with food   Hold Monday Wednesday Friday Bactrim until completing Augmentin dose then resume Monday Wednesday Friday Bactrim  We will send you home with a [facility] oxygen tank so that you can be on 4 L continuous >>> Continue 2 L - 4 L continuous to maintain oxygen saturations greater than 90%  We will order a high-res CT to further evaluate your interstitial lung disease   We will order a pulmonary function test   We will order a 6 min walk   I have also placed an order for omeprazole 20 mg tablets >>>Take one 20 mg tablet 30 minutes to an hour before your first meal the day  >>> Review GERD literature below    Follow-up in 4 to 6 weeks to ensure symptoms are improving >>> With PFT >>> Complete 6-minute walk before this appt    Follow-up with Dr. Maple Hudson in one of his first available slots in January/2020     November/2019 we will be moving! We will no longer be at our [place] location.  Be on the look out for a post card/mailer to let you know we have officially moved.  Our new address and phone number will be:  [address] [place], [place] [address] Telephone number: [phone]  It is flu season:   >>>Remember to be washing your hands regularly, using hand sanitizer, be careful to use around herself with has contact with people who are sick will increase her chances of getting sick yourself. >>> Best ways to protect herself from the flu: Receive the yearly flu vaccine, practice good hand hygiene washing with soap and also using hand sanitizer when available, eat a nutritious meals, get adequate rest, hydrate appropriately   Please contact the office if your symptoms worsen or you have concerns  that you are not improving.   Thank you for choosing [facility] for your healthcare, and for allowing [place] to partner with you on your healthcare journey. I am thankful to be able to provide care to you today.   Elisha Headland FNP-C    Gastroesophageal Reflux Disease, Adult Normally, food travels down the esophagus and stays in the stomach to be digested. If a person has gastroesophageal reflux disease (GERD), food and stomach acid move back up into the esophagus. When this happens, the esophagus becomes sore and swollen (inflamed). Over time, GERD can make small holes (ulcers) in the lining of the esophagus. Follow these instructions at home: Diet  Follow a diet as told by your doctor. You may need to avoid foods and drinks such as: ? Coffee and tea (with or without caffeine). ? Drinks that contain alcohol. ? Energy drinks and sports drinks. ? Carbonated drinks or sodas. ? Chocolate and cocoa. ? Peppermint and mint flavorings. ? Garlic and onions. ? Horseradish. ? Spicy and acidic foods, such as peppers, chili powder, curry powder, vinegar, hot sauces, and BBQ sauce. ? Citrus fruit juices and citrus fruits, such as oranges, lemons, and limes. ? Tomato-based foods, such as red sauce, chili, salsa, and pizza with red sauce. ? Fried and fatty foods, such as donuts, french fries, potato chips, and  high-fat dressings. ? High-fat meats, such as hot dogs, rib eye steak, sausage, ham, and bacon. ? High-fat dairy items, such as whole milk, butter, and cream cheese.  Eat small meals often. Avoid eating large meals.  Avoid drinking large amounts of liquid with your meals.  Avoid eating meals during the 2-3 hours before bedtime.  Avoid lying down right after you eat.  Do not exercise right after you eat. General instructions  Pay attention to any changes in your symptoms.  Take over-the-counter and prescription medicines only as told by your doctor. Do not take aspirin,  ibuprofen, or other NSAIDs unless your doctor says it is okay.  Do not use any tobacco products, including cigarettes, chewing tobacco, and e-cigarettes. If you need help quitting, ask your doctor.  Wear loose clothes. Do not wear anything tight around your waist.  Raise (elevate) the head of your bed about 6 inches (15 cm).  Try to lower your stress. If you need help doing this, ask your doctor.  If you are overweight, lose an amount of weight that is healthy for you. Ask your doctor about a safe weight loss goal.  Keep all follow-up visits as told by your doctor. This is important. Contact a doctor if:  You have new symptoms.  You lose weight and you do not know why it is happening.  You have trouble swallowing, or it hurts to swallow.  You have wheezing or a cough that keeps happening.  Your symptoms do not get better with treatment.  You have a hoarse voice. Get help right away if:  You have pain in your arms, neck, jaw, teeth, or back.  You feel sweaty, dizzy, or light-headed.  You have chest pain or shortness of breath.  You throw up (vomit) and your throw up looks like blood or coffee grounds.  You pass out (faint).  Your poop (stool) is bloody or black.  You cannot swallow, drink, or eat. This information is not intended to replace advice given to you by your health care provider. Make sure you discuss any questions you have with your health care provider. Document Released: 03/24/2008 Document Revised: 03/13/2016 Document Reviewed: 01/31/2015 Elsevier Interactive Patient Education  2018 [employer].    Food Choices for Gastroesophageal Reflux Disease, Adult When you have gastroesophageal reflux disease (GERD), the foods you eat and your eating habits are very important. Choosing the right foods can help ease your discomfort. What guidelines do I need to follow?  Choose fruits, vegetables, whole grains, and low-fat dairy products.  Choose low-fat meat,  fish, and poultry.  Limit fats such as oils, salad dressings, butter, nuts, and avocado.  Keep a food diary. This helps you identify foods that cause symptoms.  Avoid foods that cause symptoms. These may be different for everyone.  Eat small meals often instead of 3 large meals a day.  Eat your meals slowly, in a place where you are relaxed.  Limit fried foods.  Cook foods using methods other than frying.  Avoid drinking alcohol.  Avoid drinking large amounts of liquids with your meals.  Avoid bending over or lying down until 2-3 hours after eating. What foods are not recommended? These are some foods and drinks that may make your symptoms worse: Vegetables Tomatoes. Tomato juice. Tomato and spaghetti sauce. Chili peppers. Onion and garlic. Horseradish. Fruits Oranges, grapefruit, and lemon (fruit and juice). Meats High-fat meats, fish, and poultry. This includes hot dogs, ribs, ham, sausage, salami, and bacon. Dairy Whole milk  and chocolate milk. Sour cream. Cream. Butter. Ice cream. Cream cheese. Drinks Coffee and tea. Bubbly (carbonated) drinks or energy drinks. Condiments Hot sauce. Barbecue sauce. Sweets/Desserts Chocolate and cocoa. Donuts. Peppermint and spearmint. Fats and Oils High-fat foods. This includes [place] fries and potato chips. Other Vinegar. Strong spices. This includes black pepper, white pepper, red pepper, cayenne, curry powder, cloves, ginger, and chili powder. The items listed above may not be a complete list of foods and drinks to avoid. Contact your dietitian for more information. This information is not intended to replace advice given to you by your health care provider. Make sure you discuss any questions you have with your health care provider. Document Released: 04/06/2012 Document Revised: 03/13/2016 Document Reviewed: 08/10/2013 Elsevier Interactive Patient Education  2017 [employer].

## 2018-09-09 ENCOUNTER — Ambulatory Visit (INDEPENDENT_AMBULATORY_CARE_PROVIDER_SITE_OTHER): Payer: Medicare Other

## 2018-09-09 DIAGNOSIS — Q246 Congenital heart block: Secondary | ICD-10-CM | POA: Diagnosis not present

## 2018-09-10 ENCOUNTER — Encounter: Payer: Self-pay | Admitting: Cardiology

## 2018-09-10 NOTE — Progress Notes (Signed)
Remote pacemaker transmission.   

## 2018-09-21 ENCOUNTER — Ambulatory Visit (INDEPENDENT_AMBULATORY_CARE_PROVIDER_SITE_OTHER)
Admission: RE | Admit: 2018-09-21 | Discharge: 2018-09-21 | Disposition: A | Discharge status: Home / Self Care | Payer: Medicare Other | Source: Ambulatory Visit | Attending: Pulmonary Disease | Admitting: Pulmonary Disease

## 2018-09-21 DIAGNOSIS — R599 Enlarged lymph nodes, unspecified: Secondary | ICD-10-CM | POA: Diagnosis not present

## 2018-09-21 DIAGNOSIS — J849 Interstitial pulmonary disease, unspecified: Secondary | ICD-10-CM

## 2018-09-23 NOTE — Progress Notes (Signed)
High-res CT results are comparable to previous scans.  Keep follow-up with our office.  Continue forward with pulmonary function test as well as 6-minute walk.  Ensure patient also has scheduled follow-up with Dr. Maple Hudson, if not please schedule.  Okay to switch appointments with me to Dr. Maple Hudson if there is availability within that timeframe.   IMPRESSION: 1. Pulmonary parenchymal pattern of nodularity appears to be perilymphatic in distribution and is suspicious for sarcoid. Findings are similar to prior exams. Mediastinal and suspected hilar adenopathy with narrowing of the left mainstem bronchus, as before. 2. Enlarged pulmonary arteries, indicative of pulmonary arterial Hypertension.   Elisha Headland, FNP

## 2018-09-25 ENCOUNTER — Other Ambulatory Visit: Payer: Self-pay | Admitting: Family Medicine

## 2018-09-27 ENCOUNTER — Other Ambulatory Visit (HOSPITAL_COMMUNITY): Payer: Self-pay | Admitting: Adult Health

## 2018-09-30 ENCOUNTER — Ambulatory Visit: Payer: Medicare Other | Admitting: Internal Medicine

## 2018-10-05 ENCOUNTER — Encounter (INDEPENDENT_AMBULATORY_CARE_PROVIDER_SITE_OTHER): Payer: Medicare Other

## 2018-10-05 DIAGNOSIS — J849 Interstitial pulmonary disease, unspecified: Secondary | ICD-10-CM

## 2018-10-06 ENCOUNTER — Encounter: Payer: Self-pay | Admitting: Internal Medicine

## 2018-10-06 ENCOUNTER — Ambulatory Visit: Payer: Medicare Other

## 2018-10-06 ENCOUNTER — Ambulatory Visit (INDEPENDENT_AMBULATORY_CARE_PROVIDER_SITE_OTHER): Payer: Medicare Other | Admitting: Internal Medicine

## 2018-10-06 VITALS — BP 110/78 | HR 140 | Temp 99.0°F | Ht 65.0 in | Wt 133.0 lb

## 2018-10-06 DIAGNOSIS — J9611 Chronic respiratory failure with hypoxia: Secondary | ICD-10-CM | POA: Diagnosis not present

## 2018-10-06 DIAGNOSIS — J849 Interstitial pulmonary disease, unspecified: Secondary | ICD-10-CM

## 2018-10-06 DIAGNOSIS — J45901 Unspecified asthma with (acute) exacerbation: Secondary | ICD-10-CM | POA: Diagnosis not present

## 2018-10-06 LAB — POCT INFLUENZA A/B
INFLUENZA B, POC: NEGATIVE
Influenza A, POC: NEGATIVE

## 2018-10-06 MED ORDER — LEVALBUTEROL HCL 0.63 MG/3ML IN NEBU
0.6300 mg | INHALATION_SOLUTION | RESPIRATORY_TRACT | Status: AC
  Administered 2018-10-06: 0.63 mg via RESPIRATORY_TRACT

## 2018-10-06 MED ORDER — PROMETHAZINE-CODEINE 6.25-10 MG/5ML PO SYRP: 5.0000 mL | ORAL_SOLUTION | Freq: Four times a day (QID) | ORAL | 0 refills | Status: DC | PRN

## 2018-10-06 MED ORDER — AZITHROMYCIN 250 MG PO TABS: ORAL_TABLET | ORAL | 0 refills | Status: DC

## 2018-10-06 NOTE — Progress Notes (Signed)
HPI F former smoker originaly referred for allergy evaluation, rhinitis, asthmatic bronchitis, then hosp 2015 for ?ALI with lower zone pneumonitis/ BAL and special studies NEG,. 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 Allergy profile 02/23/2014-negative with total IgE 22.9 HSP panel was neg.  Positive ANA, a speckled positive ANA 1-80 pattern, mildly elevated ESR, non-reactive HIV, and negative PPD EF (25-30%, from baseline 30-25% in 2013).  2015- CXR-bilateral progressive lower lobe peribronchial infiltrates with groundglass changes and nodular pattern consistent with acute pneumonitis of unclear etiology Walk test on room air: 44/22/16: 98%, 92%, 91%, 90% at rest on room air. CT chest 06/28/2015-Severe bilaterally symmetric ground-glass consolidation.  ------------------------------------------------------------------------------------------------------------------------ 11/24/2016-35 yoF former smoker originaly referred for allergy evaluation, rhinitis, asthmatic bronchitis, then hosp 2015 for ?ALI with lower zone pneumonitis/ BAL and special studies NEG,. Lupus,  Acute on Chronic respiratory failure/ Hypoxemia,.DM Pacemaker for AV block  O2 2-4  L/[facility] 6 month follow up. Had pneumonia previously but still has a cough.  Semi-productive cough.  She has lost considerable weight since first met several years ago. Hospitalized again for pneumonia. Reports only clear mucus and some chilling without fever or purulent discharge, suggesting possibly viral etiology. Cough remains productive. Runny nose. She wanted reassurance that she didn't have pneumonia.  Chest x-ray reviewed and compared with priors. I think the pattern of interstitial prominence has been stable over the  past year. It is nonspecific for vascular congestion versus interstitial pneumonia but may just reflect scarring. CXR 11/01/2016 IMPRESSION: Vascular congestion noted. Increased interstitial markings may reflect mild interstitial edema. Given the patient's symptoms, pneumonia could have a similar appearance.  10/06/2018- 50 yoF former smoker originaly referred for allergy evaluation, rhinitis, asthmatic bronchitis, then hosp 2015 for ?ALI with lower zone pneumonitis/ BAL and special studies NEG,. Lupus/ followed at [facility] for anti-synthetase syndrome, ILD, Acute on Chronic respiratory failure/ Hypoxemia,.DM Pacemaker for AV block , Pulm HTN, DM2,  chronic dilated cardiomyopathy O2 2-4  L/[facility]  POC -----For a week  she has had chest and head congestion.  Maintenance prednisone 10 mg daily.  Recently finished amoxicillin.  Now acute illness starting 10 days ago with sudden onset, low-grade fever mild chilling, myalgias, watery phlegm and rhinorrhea.  She has persistent nausea and loose stools. She has a pulse portable oxygen concentrator.  At last visit [facility] was asked to provide her with portable tanks for continuous flow at 4 L but she never received those. Rapid Flu test Nasal Swab-negative CT chestHR/ 09/21/18- IMPRESSION: 1. Pulmonary parenchymal pattern of nodularity appears to be perilymphatic in distribution and is suspicious for sarcoid. Findings are similar to prior exams. Mediastinal and suspected hilar adenopathy with narrowing of the left mainstem bronchus, as before. 2. Enlarged pulmonary arteries, indicative of pulmonary arterial hypertension.  ROS-see HPI   + = positive Constitutional:   + weight loss, night sweats, +fevers, + chills, fatigue, lassitude. HEENT:   , difficulty swallowing, tooth/dental problems, sore throat,       sneezing, itching, ear ache, nasal congestion, + post nasal drip,  CV:  No-   chest pain, orthopnea, PND, swelling in lower extremities, anasarca,  dizziness, palpitations Resp: + shortness of breath with exertion or at rest.             + productive cough, + non-productive cough,  No- coughing up of blood.               No- wheezing.   Skin: No-   rash or lesions. GI:   heartburn, indigestion, abdominal pain, nausea, vomiting,  GU:  MS:  No-   joint pain or swelling.   Neuro-     nothing unusual Psych:  No- change in mood or affect. No depression or anxiety.  No memory loss.  OBJ- Physical Exam   desaturated to 80% on 4 L pulse on arrival and was switched to our tank General- Alert, Oriented, Affect-appropriate, +wearing mask  Skin- +acanthotic hyperpigmentation around neck, +icthyotic scaling on calves Lymphadenopathy- none Head- atraumatic            Eyes- Gross vision intact, PERRLA, conjunctivae and secretions clear            Ears- Hearing, canals-normal            Nose-  no-Septal dev, mucus, polyps, erosion, perforation + snffling            Throat- Mallampati III , mucosa clear , drainage- none, tonsils- atrophic Neck- flexible , trachea midline, no stridor , thyroid nl, carotid no bruit Chest - symmetrical excursion , unlabored           Heart/CV- RRR , no murmur , no gallop  , no rub, nl s1 s2                           - JVD- none , edema- none, stasis changes- none, varices- none           Lung- +few crackles in bases, wheeze- none, cough+  , dullness-none, rub- none.           Chest wall-  + pacemaker Abd-  Br/ Gen/ Rectal- Not done, not indicated Extrem- cyanosis- none, clubbing, none, atrophy- none, strength- nl Neuro- grossly intact to observation

## 2018-10-06 NOTE — Assessment & Plan Note (Signed)
Acute exacerbation consistent with viral respiratory infection.  She is immunocompromised and at risk for rapid progression during the holidays, so I have chosen to give her a course of azithromycin, hoping to suppress a bacterial infection.  Oxygenation is down.  We will send her home with [facility] portable tank and ask again for [facility] to provide portable oxygen capable of giving her 4 L sustained continuous flow.

## 2018-10-06 NOTE — Assessment & Plan Note (Signed)
Lupus with anti-synthetase syndrome.  Chronic immunosuppressive therapy with prednisone and CellCept being managed by Rheumatology at [facility]

## 2018-10-06 NOTE — Patient Instructions (Signed)
Order- lab- ACE level     Dx sarcoid  Order- Flu swab    Dx asthmatic bronchitis exacerbation  Order- neb xop 0.63  Script sent for Zpak antibiotic, and for cough syrup  Order- DME [facility]- please provide portable O2 tanks to allow 4L continous during exacerbations, also Oxymiser.    Dx acute and chronic hypoxic respiratory failure

## 2018-10-07 ENCOUNTER — Telehealth: Payer: Self-pay | Admitting: Internal Medicine

## 2018-10-07 LAB — ANGIOTENSIN CONVERTING ENZYME: ANGIOTENSIN-CONVERTING ENZYME: 46 U/L (ref 9–67)

## 2018-10-07 MED ORDER — PROMETHAZINE-CODEINE 6.25-10 MG/5ML PO SYRP: 5.0000 mL | ORAL_SOLUTION | Freq: Four times a day (QID) | ORAL | 0 refills | Status: DC | PRN

## 2018-10-07 NOTE — Telephone Encounter (Signed)
CY Please advise on an alternative. Thanks

## 2018-10-07 NOTE — Telephone Encounter (Signed)
Called patient to see if there is another local pharmacy near her that we can send Rx to. LMTCB

## 2018-10-07 NOTE — Telephone Encounter (Signed)
No [facility] within 20 [facility] radius of patient [facility] Rx-pt decided to use [facility] [address] location; I was able to give Rx verbally via phone call. Pt is aware and nothing more needed at this time.

## 2018-10-07 NOTE — Telephone Encounter (Signed)
Suggest send to another pharmacy chain

## 2018-10-08 ENCOUNTER — Other Ambulatory Visit: Payer: Self-pay | Admitting: Cardiology

## 2018-10-15 ENCOUNTER — Ambulatory Visit: Payer: Medicare Other

## 2018-10-15 ENCOUNTER — Ambulatory Visit: Payer: Medicare Other | Admitting: Pulmonary Disease

## 2018-10-17 IMAGING — DX DG CHEST 2V
2 series · 2 of 2 positions shown · non-contrast
Comparison: Chest radiograph February 21, 2016 and chest CT February 22, 2016

CLINICAL DATA: Hyperglycemia for 2 weeks. History of diabetes and
heart failure.

EXAM:
CHEST  2 VIEW

[chest pa]
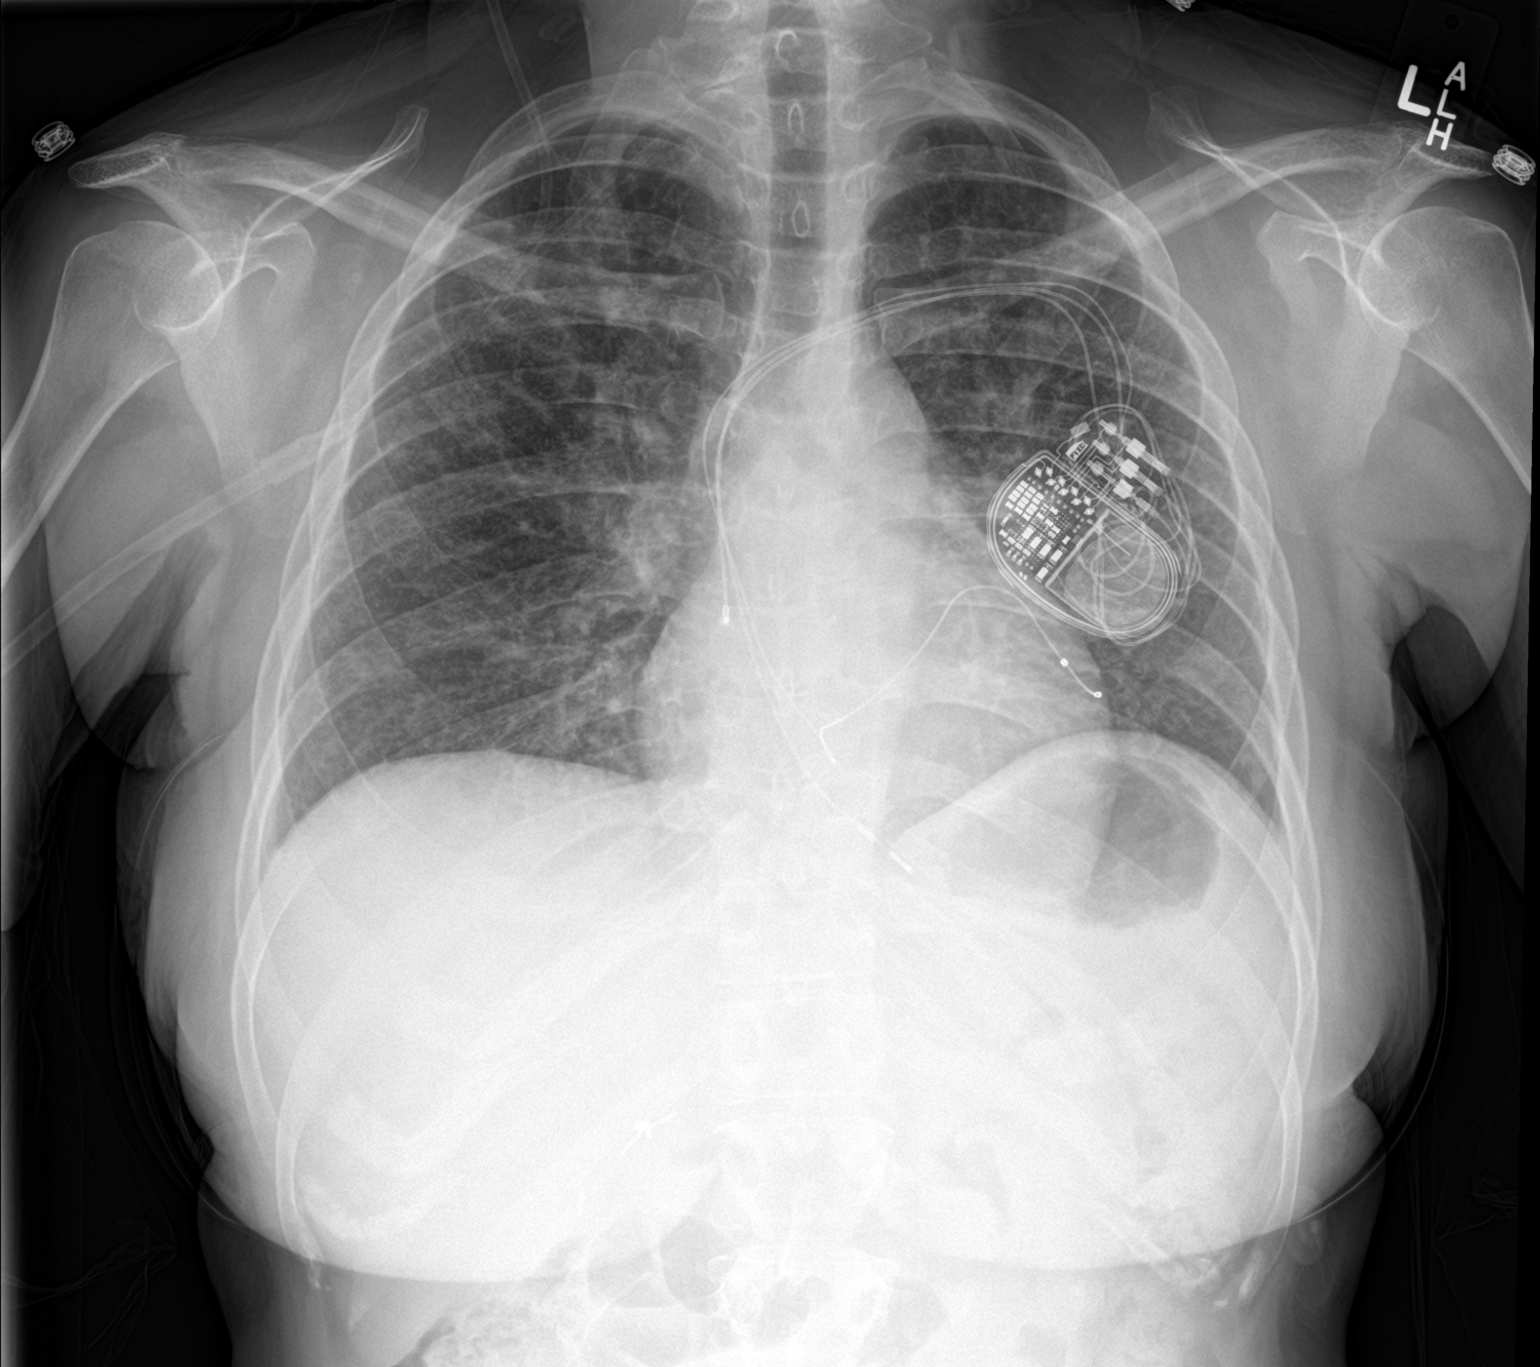

[chest lat]
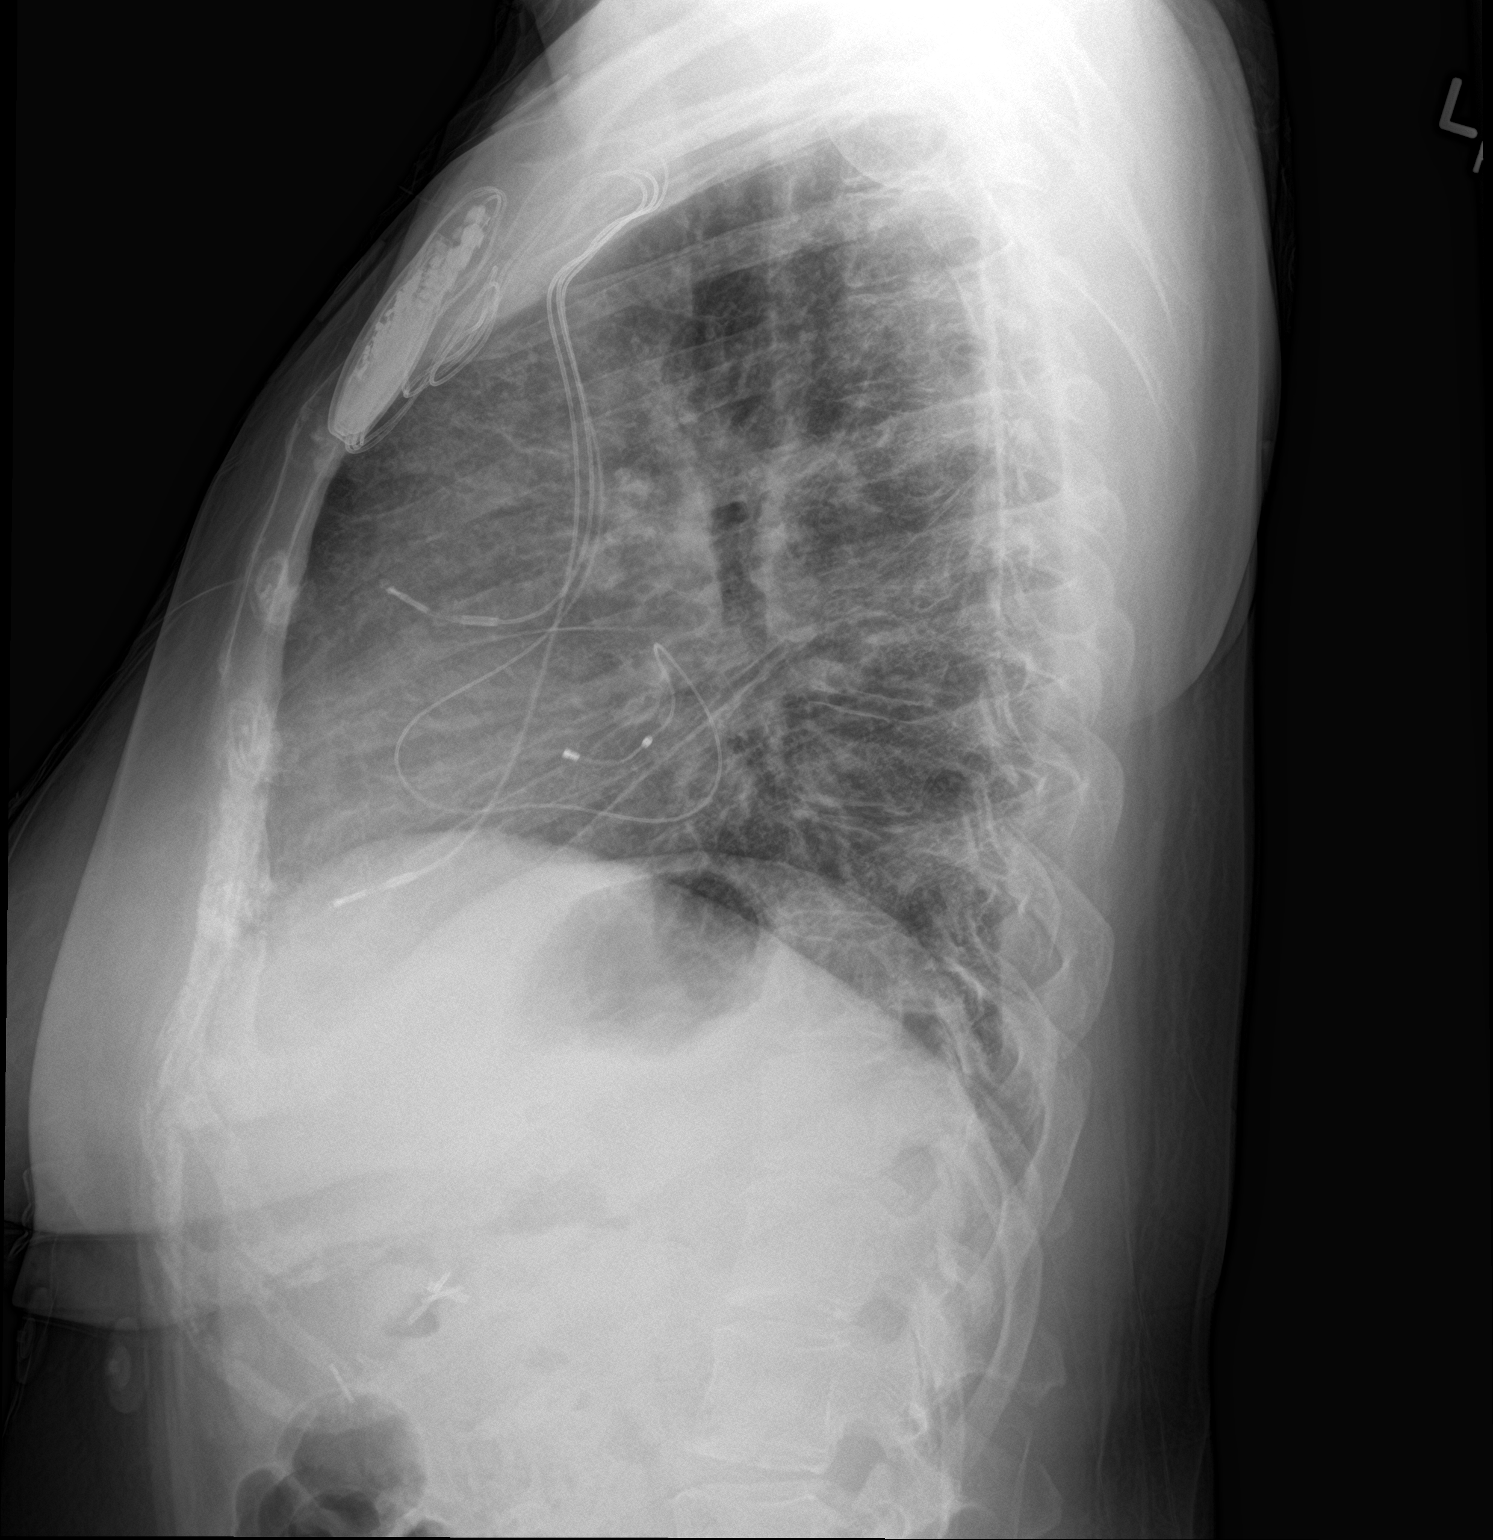

[2 of 2 positions shown; findings below may reference images not displayed]

FINDINGS: Cardiomediastinal silhouette is normal. Coarsened pulmonary
interstitium with faint alveolar airspace opacities. No pleural
effusion. No pneumothorax. LEFT cardiac pacemaker in situ. Soft
tissue planes and included osseous structures are nonsuspicious.
Surgical clips in the included right abdomen compatible with
cholecystectomy.
IMPRESSION: Interstitial and to lesser extent alveolar airspace opacities
concerning for pneumonia, less likely pulmonary edema. Chronic
inflammatory process is possible.

## 2018-10-18 IMAGING — DX DG ABDOMEN 1V
1 series · 1 of 1 positions shown · non-contrast
Comparison: CT 02/21/2016

CLINICAL DATA: Abdominal pain intermittently for 2-3 weeks.

EXAM:
ABDOMEN - 1 VIEW

[abdomen kub]
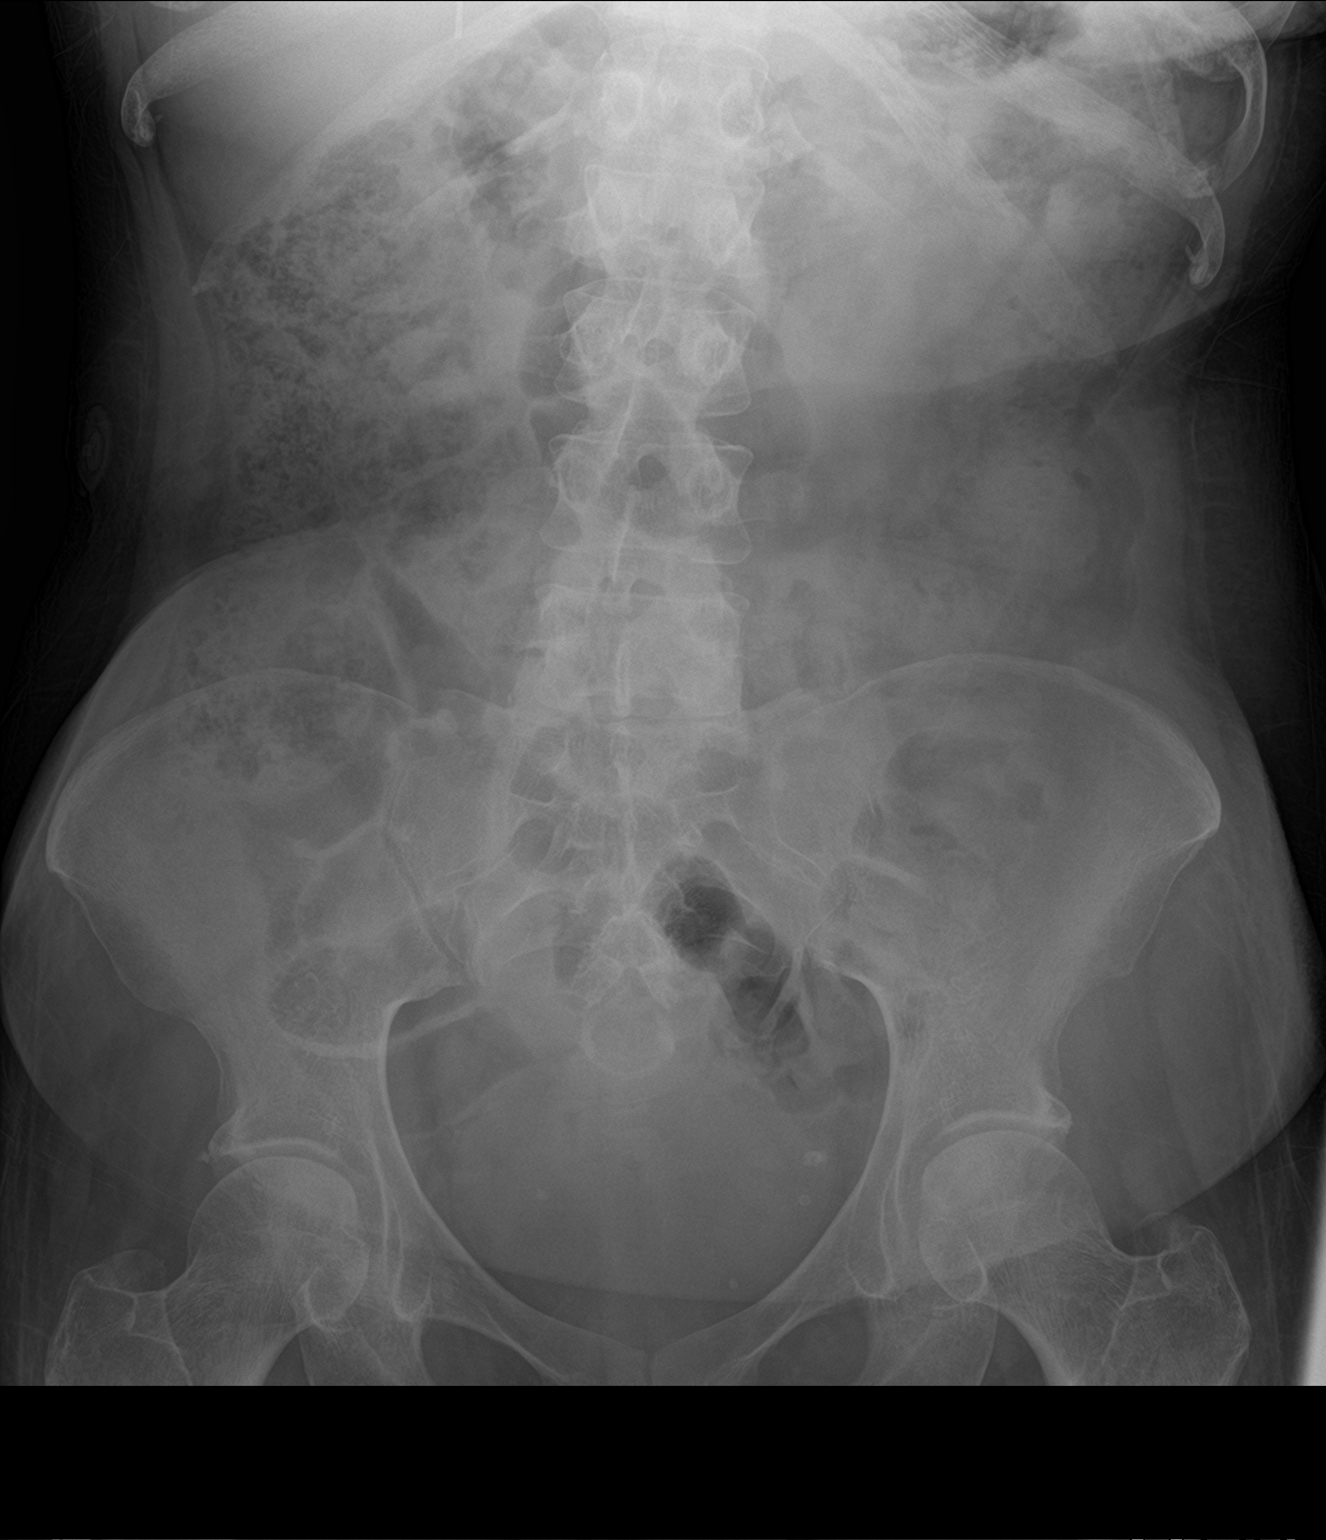

[1 of 1 positions shown; findings below may reference images not displayed]

FINDINGS: The bowel gas pattern is normal. No radio-opaque calculi or other
significant radiographic abnormality are seen.
IMPRESSION: Negative.

## 2018-10-22 ENCOUNTER — Other Ambulatory Visit: Payer: Self-pay

## 2018-10-22 MED ORDER — METFORMIN HCL 1000 MG PO TABS: 1000.0000 mg | ORAL_TABLET | Freq: Two times a day (BID) | ORAL | 3 refills | Status: DC

## 2018-10-24 ENCOUNTER — Other Ambulatory Visit: Payer: Self-pay

## 2018-10-24 ENCOUNTER — Observation Stay (HOSPITAL_COMMUNITY)
Admission: EM | Admit: 2018-10-24 | Discharge: 2018-10-25 | Disposition: A | Discharge status: Home / Self Care | Payer: Medicare Other | Attending: Family Medicine | Admitting: Family Medicine

## 2018-10-24 ENCOUNTER — Encounter (HOSPITAL_COMMUNITY): Payer: Self-pay

## 2018-10-24 ENCOUNTER — Emergency Department (HOSPITAL_COMMUNITY): Payer: Medicare Other

## 2018-10-24 DIAGNOSIS — E785 Hyperlipidemia, unspecified: Secondary | ICD-10-CM | POA: Diagnosis not present

## 2018-10-24 DIAGNOSIS — I420 Dilated cardiomyopathy: Secondary | ICD-10-CM | POA: Insufficient documentation

## 2018-10-24 DIAGNOSIS — D649 Anemia, unspecified: Secondary | ICD-10-CM | POA: Diagnosis not present

## 2018-10-24 DIAGNOSIS — Z794 Long term (current) use of insulin: Secondary | ICD-10-CM | POA: Insufficient documentation

## 2018-10-24 DIAGNOSIS — M25531 Pain in right wrist: Secondary | ICD-10-CM | POA: Insufficient documentation

## 2018-10-24 DIAGNOSIS — E1169 Type 2 diabetes mellitus with other specified complication: Secondary | ICD-10-CM | POA: Diagnosis not present

## 2018-10-24 DIAGNOSIS — A419 Sepsis, unspecified organism: Secondary | ICD-10-CM | POA: Diagnosis not present

## 2018-10-24 DIAGNOSIS — I5042 Chronic combined systolic (congestive) and diastolic (congestive) heart failure: Secondary | ICD-10-CM | POA: Diagnosis not present

## 2018-10-24 DIAGNOSIS — J849 Interstitial pulmonary disease, unspecified: Secondary | ICD-10-CM | POA: Diagnosis not present

## 2018-10-24 DIAGNOSIS — R52 Pain, unspecified: Secondary | ICD-10-CM | POA: Diagnosis not present

## 2018-10-24 DIAGNOSIS — F319 Bipolar disorder, unspecified: Secondary | ICD-10-CM | POA: Insufficient documentation

## 2018-10-24 DIAGNOSIS — T380X5A Adverse effect of glucocorticoids and synthetic analogues, initial encounter: Secondary | ICD-10-CM | POA: Diagnosis not present

## 2018-10-24 DIAGNOSIS — E669 Obesity, unspecified: Secondary | ICD-10-CM

## 2018-10-24 DIAGNOSIS — Q246 Congenital heart block: Secondary | ICD-10-CM | POA: Insufficient documentation

## 2018-10-24 DIAGNOSIS — R069 Unspecified abnormalities of breathing: Secondary | ICD-10-CM | POA: Diagnosis not present

## 2018-10-24 DIAGNOSIS — E099 Drug or chemical induced diabetes mellitus without complications: Secondary | ICD-10-CM | POA: Diagnosis present

## 2018-10-24 DIAGNOSIS — M3213 Lung involvement in systemic lupus erythematosus: Secondary | ICD-10-CM

## 2018-10-24 DIAGNOSIS — J45909 Unspecified asthma, uncomplicated: Secondary | ICD-10-CM | POA: Insufficient documentation

## 2018-10-24 DIAGNOSIS — M329 Systemic lupus erythematosus, unspecified: Secondary | ICD-10-CM | POA: Insufficient documentation

## 2018-10-24 DIAGNOSIS — I110 Hypertensive heart disease with heart failure: Secondary | ICD-10-CM | POA: Insufficient documentation

## 2018-10-24 DIAGNOSIS — R0902 Hypoxemia: Secondary | ICD-10-CM | POA: Diagnosis not present

## 2018-10-24 DIAGNOSIS — J189 Pneumonia, unspecified organism: Principal | ICD-10-CM | POA: Diagnosis present

## 2018-10-24 DIAGNOSIS — R9431 Abnormal electrocardiogram [ECG] [EKG]: Secondary | ICD-10-CM | POA: Diagnosis not present

## 2018-10-24 DIAGNOSIS — J984 Other disorders of lung: Secondary | ICD-10-CM | POA: Diagnosis not present

## 2018-10-24 DIAGNOSIS — R0689 Other abnormalities of breathing: Secondary | ICD-10-CM | POA: Diagnosis not present

## 2018-10-24 DIAGNOSIS — D899 Disorder involving the immune mechanism, unspecified: Secondary | ICD-10-CM | POA: Diagnosis not present

## 2018-10-24 DIAGNOSIS — Z8249 Family history of ischemic heart disease and other diseases of the circulatory system: Secondary | ICD-10-CM | POA: Insufficient documentation

## 2018-10-24 DIAGNOSIS — K219 Gastro-esophageal reflux disease without esophagitis: Secondary | ICD-10-CM | POA: Insufficient documentation

## 2018-10-24 DIAGNOSIS — D849 Immunodeficiency, unspecified: Secondary | ICD-10-CM | POA: Diagnosis present

## 2018-10-24 DIAGNOSIS — E46 Unspecified protein-calorie malnutrition: Secondary | ICD-10-CM | POA: Diagnosis not present

## 2018-10-24 DIAGNOSIS — Z9981 Dependence on supplemental oxygen: Secondary | ICD-10-CM | POA: Diagnosis not present

## 2018-10-24 DIAGNOSIS — M069 Rheumatoid arthritis, unspecified: Secondary | ICD-10-CM | POA: Diagnosis not present

## 2018-10-24 DIAGNOSIS — F419 Anxiety disorder, unspecified: Secondary | ICD-10-CM | POA: Insufficient documentation

## 2018-10-24 DIAGNOSIS — R000 Tachycardia, unspecified: Secondary | ICD-10-CM | POA: Diagnosis not present

## 2018-10-24 DIAGNOSIS — Z79899 Other long term (current) drug therapy: Secondary | ICD-10-CM | POA: Insufficient documentation

## 2018-10-24 DIAGNOSIS — Z950 Presence of cardiac pacemaker: Secondary | ICD-10-CM | POA: Diagnosis not present

## 2018-10-24 DIAGNOSIS — R509 Fever, unspecified: Secondary | ICD-10-CM | POA: Diagnosis not present

## 2018-10-24 DIAGNOSIS — K5900 Constipation, unspecified: Secondary | ICD-10-CM | POA: Diagnosis not present

## 2018-10-24 DIAGNOSIS — Z7952 Long term (current) use of systemic steroids: Secondary | ICD-10-CM

## 2018-10-24 DIAGNOSIS — J9611 Chronic respiratory failure with hypoxia: Secondary | ICD-10-CM | POA: Diagnosis present

## 2018-10-24 DIAGNOSIS — Z87891 Personal history of nicotine dependence: Secondary | ICD-10-CM | POA: Insufficient documentation

## 2018-10-24 DIAGNOSIS — R1084 Generalized abdominal pain: Secondary | ICD-10-CM | POA: Diagnosis not present

## 2018-10-24 DIAGNOSIS — Z6822 Body mass index (BMI) 22.0-22.9, adult: Secondary | ICD-10-CM | POA: Insufficient documentation

## 2018-10-24 LAB — CK: Total CK: 147 U/L (ref 38–234)

## 2018-10-24 LAB — CBC WITH DIFFERENTIAL/PLATELET
ABS IMMATURE GRANULOCYTES: 0.03 10*3/uL (ref 0.00–0.07)
BASOS PCT: 0 %
Basophils Absolute: 0 10*3/uL (ref 0.0–0.1)
Eosinophils Absolute: 0.1 10*3/uL (ref 0.0–0.5)
Eosinophils Relative: 1 %
HCT: 35.3 % — ABNORMAL LOW (ref 36.0–46.0)
Hemoglobin: 9.7 g/dL — ABNORMAL LOW (ref 12.0–15.0)
Immature Granulocytes: 0 %
Lymphocytes Relative: 13 %
Lymphs Abs: 1.2 10*3/uL (ref 0.7–4.0)
MCH: 22.3 pg — AB (ref 26.0–34.0)
MCHC: 27.5 g/dL — ABNORMAL LOW (ref 30.0–36.0)
MCV: 81.1 fL (ref 80.0–100.0)
MONO ABS: 0.7 10*3/uL (ref 0.1–1.0)
Monocytes Relative: 8 %
NEUTROS ABS: 7.1 10*3/uL (ref 1.7–7.7)
NEUTROS PCT: 78 %
NRBC: 0 % (ref 0.0–0.2)
Platelets: 294 10*3/uL (ref 150–400)
RBC: 4.35 MIL/uL (ref 3.87–5.11)
RDW: 17.5 % — ABNORMAL HIGH (ref 11.5–15.5)
WBC: 9.2 10*3/uL (ref 4.0–10.5)

## 2018-10-24 LAB — COMPREHENSIVE METABOLIC PANEL
ALT: 11 U/L (ref 0–44)
ANION GAP: 10 (ref 5–15)
AST: 19 U/L (ref 15–41)
Albumin: 3.1 g/dL — ABNORMAL LOW (ref 3.5–5.0)
Alkaline Phosphatase: 57 U/L (ref 38–126)
BILIRUBIN TOTAL: 0.4 mg/dL (ref 0.3–1.2)
BUN: 10 mg/dL (ref 6–20)
CO2: 23 mmol/L (ref 22–32)
Calcium: 8.4 mg/dL — ABNORMAL LOW (ref 8.9–10.3)
Chloride: 104 mmol/L (ref 98–111)
Creatinine, Ser: 0.73 mg/dL (ref 0.44–1.00)
GFR calc Af Amer: >60 mL/min (ref >60)
Glucose, Bld: 135 mg/dL — ABNORMAL HIGH (ref 70–99)
POTASSIUM: 3.6 mmol/L (ref 3.5–5.1)
Sodium: 137 mmol/L (ref 135–145)
TOTAL PROTEIN: 7 g/dL (ref 6.5–8.1)

## 2018-10-24 LAB — I-STAT CG4 LACTIC ACID, ED
LACTIC ACID, VENOUS: 2.37 mmol/L — AB (ref 0.5–1.9)
Lactic Acid, Venous: 1.13 mmol/L (ref 0.5–1.9)

## 2018-10-24 LAB — BASIC METABOLIC PANEL
Anion gap: 8 (ref 5–15)
BUN: 9 mg/dL (ref 6–20)
CO2: 28 mmol/L (ref 22–32)
Calcium: 8 mg/dL — ABNORMAL LOW (ref 8.9–10.3)
Chloride: 102 mmol/L (ref 98–111)
Creatinine, Ser: 0.72 mg/dL (ref 0.44–1.00)
GFR calc non Af Amer: >60 mL/min (ref >60)
Glucose, Bld: 148 mg/dL — ABNORMAL HIGH (ref 70–99)
Potassium: 3.6 mmol/L (ref 3.5–5.1)
Sodium: 138 mmol/L (ref 135–145)

## 2018-10-24 LAB — GLUCOSE, CAPILLARY
Glucose-Capillary: 92 mg/dL (ref 70–99)
Glucose-Capillary: 97 mg/dL (ref 70–99)
Glucose-Capillary: 133 mg/dL — ABNORMAL HIGH (ref 70–99)
Glucose-Capillary: 176 mg/dL — ABNORMAL HIGH (ref 70–99)

## 2018-10-24 LAB — CBC
HEMATOCRIT: 27.7 % — AB (ref 36.0–46.0)
HEMOGLOBIN: 7.9 g/dL — AB (ref 12.0–15.0)
MCH: 22.8 pg — ABNORMAL LOW (ref 26.0–34.0)
MCHC: 28.5 g/dL — ABNORMAL LOW (ref 30.0–36.0)
MCV: 79.8 fL — ABNORMAL LOW (ref 80.0–100.0)
Platelets: 274 10*3/uL (ref 150–400)
RBC: 3.47 MIL/uL — ABNORMAL LOW (ref 3.87–5.11)
RDW: 17.7 % — ABNORMAL HIGH (ref 11.5–15.5)
WBC: 6.6 10*3/uL (ref 4.0–10.5)
nRBC: 0 % (ref 0.0–0.2)

## 2018-10-24 LAB — INFLUENZA PANEL BY PCR (TYPE A & B)
Influenza A By PCR: NEGATIVE
Influenza B By PCR: NEGATIVE

## 2018-10-24 LAB — URINALYSIS, ROUTINE W REFLEX MICROSCOPIC
Bilirubin Urine: NEGATIVE
Glucose, UA: NEGATIVE mg/dL
Hgb urine dipstick: NEGATIVE
Ketones, ur: NEGATIVE mg/dL
Leukocytes, UA: NEGATIVE
Nitrite: NEGATIVE
Protein, ur: NEGATIVE mg/dL
Specific Gravity, Urine: 1.023 (ref 1.005–1.030)
pH: 5 (ref 5.0–8.0)

## 2018-10-24 LAB — BRAIN NATRIURETIC PEPTIDE: B NATRIURETIC PEPTIDE 5: 41.4 pg/mL (ref 0.0–100.0)

## 2018-10-24 LAB — MAGNESIUM: Magnesium: 1.2 mg/dL — ABNORMAL LOW (ref 1.7–2.4)

## 2018-10-24 LAB — I-STAT BETA HCG BLOOD, ED (MC, WL, AP ONLY): I-stat hCG, quantitative: <5 m[IU]/mL (ref <5)

## 2018-10-24 LAB — I-STAT TROPONIN, ED: Troponin i, poc: 0.01 ng/mL (ref 0.00–0.08)

## 2018-10-24 MED ORDER — FUROSEMIDE 40 MG PO TABS
40.0000 mg | ORAL_TABLET | Freq: Every day | ORAL | Status: DC
  Administered 2018-10-24 – 2018-10-25 (×2): 40 mg via ORAL
  Filled 2018-10-24 (×2): qty 1

## 2018-10-24 MED ORDER — DICYCLOMINE HCL 20 MG PO TABS
20.0000 mg | ORAL_TABLET | Freq: Three times a day (TID) | ORAL | Status: DC
  Administered 2018-10-24 – 2018-10-25 (×7): 20 mg via ORAL
  Filled 2018-10-24 (×6): qty 1

## 2018-10-24 MED ORDER — ACETAMINOPHEN 650 MG RE SUPP: 650.0000 mg | Freq: Four times a day (QID) | RECTAL | Status: DC | PRN

## 2018-10-24 MED ORDER — DOXYCYCLINE HYCLATE 100 MG PO TABS
100.0000 mg | ORAL_TABLET | Freq: Two times a day (BID) | ORAL | Status: DC
  Administered 2018-10-24 – 2018-10-25 (×3): 100 mg via ORAL
  Filled 2018-10-24 (×3): qty 1

## 2018-10-24 MED ORDER — ACETAMINOPHEN 325 MG PO TABS: 650.0000 mg | ORAL_TABLET | Freq: Four times a day (QID) | ORAL | Status: DC | PRN

## 2018-10-24 MED ORDER — PREDNISONE 20 MG PO TABS
30.0000 mg | ORAL_TABLET | Freq: Every day | ORAL | Status: DC
  Administered 2018-10-25: 30 mg via ORAL
  Filled 2018-10-24: qty 1

## 2018-10-24 MED ORDER — POLYETHYLENE GLYCOL 3350 17 G PO PACK
17.0000 g | PACK | Freq: Every day | ORAL | Status: DC
  Filled 2018-10-24 (×2): qty 1

## 2018-10-24 MED ORDER — INSULIN ASPART 100 UNIT/ML ~~LOC~~ SOLN
0.0000 [IU] | Freq: Three times a day (TID) | SUBCUTANEOUS | Status: DC
  Administered 2018-10-25: 8 [IU] via SUBCUTANEOUS
  Administered 2018-10-25: 3 [IU] via SUBCUTANEOUS

## 2018-10-24 MED ORDER — SODIUM CHLORIDE 0.9 % IV SOLN
1.0000 g | Freq: Once | INTRAVENOUS | Status: AC
  Administered 2018-10-24: 1 g via INTRAVENOUS
  Filled 2018-10-24: qty 10

## 2018-10-24 MED ORDER — PANTOPRAZOLE SODIUM 40 MG PO TBEC
40.0000 mg | DELAYED_RELEASE_TABLET | Freq: Every day | ORAL | Status: DC
  Administered 2018-10-24 – 2018-10-25 (×2): 40 mg via ORAL
  Filled 2018-10-24 (×2): qty 1

## 2018-10-24 MED ORDER — ACETAMINOPHEN 500 MG PO TABS
1000.0000 mg | ORAL_TABLET | Freq: Once | ORAL | Status: AC
  Administered 2018-10-24: 1000 mg via ORAL
  Filled 2018-10-24: qty 2

## 2018-10-24 MED ORDER — SODIUM CHLORIDE 0.9 % IV SOLN
INTRAVENOUS | Status: DC
  Administered 2018-10-24: 03:00:00 via INTRAVENOUS

## 2018-10-24 MED ORDER — ENOXAPARIN SODIUM 40 MG/0.4ML ~~LOC~~ SOLN
40.0000 mg | SUBCUTANEOUS | Status: DC
  Administered 2018-10-24 – 2018-10-25 (×2): 40 mg via SUBCUTANEOUS
  Filled 2018-10-24 (×2): qty 0.4

## 2018-10-24 MED ORDER — PREDNISONE 20 MG PO TABS
20.0000 mg | ORAL_TABLET | ORAL | Status: AC
  Administered 2018-10-24: 20 mg via ORAL
  Filled 2018-10-24: qty 1

## 2018-10-24 MED ORDER — ONDANSETRON HCL 4 MG/2ML IJ SOLN: 4.0000 mg | Freq: Four times a day (QID) | INTRAMUSCULAR | Status: DC | PRN

## 2018-10-24 MED ORDER — GUAIFENESIN-DM 100-10 MG/5ML PO SYRP
5.0000 mL | ORAL_SOLUTION | ORAL | Status: DC | PRN
  Filled 2018-10-24: qty 5

## 2018-10-24 MED ORDER — ONDANSETRON HCL 4 MG PO TABS: 4.0000 mg | ORAL_TABLET | Freq: Four times a day (QID) | ORAL | Status: DC | PRN

## 2018-10-24 MED ORDER — CEFDINIR 300 MG PO CAPS
300.0000 mg | ORAL_CAPSULE | Freq: Two times a day (BID) | ORAL | Status: DC
  Administered 2018-10-24 – 2018-10-25 (×3): 300 mg via ORAL
  Filled 2018-10-24 (×4): qty 1

## 2018-10-24 MED ORDER — GUAIFENESIN-CODEINE 100-10 MG/5ML PO SOLN: 10.0000 mL | ORAL | Status: DC | PRN

## 2018-10-24 MED ORDER — ALBUTEROL SULFATE (2.5 MG/3ML) 0.083% IN NEBU
2.5000 mg | INHALATION_SOLUTION | Freq: Once | RESPIRATORY_TRACT | Status: AC
  Administered 2018-10-24: 2.5 mg via RESPIRATORY_TRACT
  Filled 2018-10-24: qty 3

## 2018-10-24 MED ORDER — SODIUM CHLORIDE 0.9 % IV SOLN
INTRAVENOUS | Status: DC
  Administered 2018-10-25: 03:00:00 via INTRAVENOUS

## 2018-10-24 MED ORDER — SODIUM CHLORIDE 0.9 % IV SOLN
500.0000 mg | Freq: Once | INTRAVENOUS | Status: AC
  Administered 2018-10-24: 500 mg via INTRAVENOUS
  Filled 2018-10-24: qty 500

## 2018-10-24 MED ORDER — PREDNISONE 10 MG PO TABS: 10.0000 mg | ORAL_TABLET | Freq: Every day | ORAL | Status: DC

## 2018-10-24 MED ORDER — METOPROLOL TARTRATE 12.5 MG HALF TABLET
12.5000 mg | ORAL_TABLET | Freq: Two times a day (BID) | ORAL | Status: DC
  Administered 2018-10-24 – 2018-10-25 (×2): 12.5 mg via ORAL
  Filled 2018-10-24 (×3): qty 1

## 2018-10-24 MED ORDER — GUAIFENESIN-CODEINE 100-10 MG/5ML PO SOLN
10.0000 mL | ORAL | Status: AC
  Administered 2018-10-24: 10 mL via ORAL
  Filled 2018-10-24: qty 10

## 2018-10-24 MED ORDER — MAGNESIUM SULFATE 2 GM/50ML IV SOLN
2.0000 g | Freq: Once | INTRAVENOUS | Status: AC
  Administered 2018-10-24: 2 g via INTRAVENOUS
  Filled 2018-10-24: qty 50

## 2018-10-24 NOTE — H&P (Signed)
[facility] Admission History and Physical Service Pager: [phone]  Patient name: Makayla Vasquez Medical record number: [id number] Date of birth: 11-Aug-1981 Age: 38 y.o. Gender: female  Primary Care Provider: [place] Bing, DO Consultants: none Code Status: full  Chief Complaint: shortness of breath  Assessment and Plan: ZIARA THELANDER is a 38 y.o. female presenting with shortness of breath . PMH is significant for SLE, anti-synthetase syndrome, myositis, anxiety, rheumatoid arthritis, asthma, CHF (EF 25-30% in 2015 with pacemaker insertion in 1997), IDDM, interstitial lung disease, and hypertension  SOB- likely secondary to CAP, initially met sepsis criteria with fever, tachycardia, hypotension. CXR demonstrates mild superimposed right mid lung and left midlung aispace opacity suspicious for pneumonia. Flu testing was negative. Initial lactic acid elevated to 2.37 but resolved to 1.13. No leukocytosis. Patient on 4L of O2 at home and is at her baseline oxygen requirement. Suspect more of a vital etiology but given complicated history and poor lung function, radiology suspicion for consolidation on CXR would treat with antibiotics. CTX given in ED. She was recently treated with azithromycin 12/18 by pulmonology. Additionally other etiologies were considered for SOB, patient has hx of heart failure but does not appear to be in exacerbation, not fluid overloaded on exam or CXR, BNP 41.4. Troponin negative, EKG sinus tach no acute ST changes. Hemoglobin stable at 9.7. Unlikely to PE, wells score 1.5 for tachycardia which is low risk. -place in observation, med-surg, attending Dr. McDiarmid -continue supplemental O2, goal SpO2>90% -vitals per floor -blood cx collected in ED, follow cx data -patient received dose of CTX in ED, transition to oral antibiotics as able -continuous pulse ox -gentle fluids given low BP, careful with hx CHF -zofran prn  nausea -robitussin DM prn for cough  Right Wrist pain- patient complaining of right wrist pain, admitted in October for wrist pain/fever given antibiotics briefly and thought to be arthritis flare, rheumatology called by ED and they recommended x-ray, x-ray normal. No erythema or joint swelling appreciated on exam.  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 last echo 06/2018 with EF 76%, normal systolic function, L4YT. On metoprolol succinate 25 mg daily.  -strict I/Os -monitor fluids status given fluid resuscitation in ED, may need additional diuretics -continue home metoprolol as tartrate 12.5 BID  SLE/anti-synthetase syndrome- patient follows at [facility], on tacrolimus and cellcept. ED provider had spoken with her rheumatologist who recommended holding medications in setting of infection. -hold tacro and cellcept due to pneumonia -continue prednisone 10 mg daily, stress dose if needed  Interstitial lung disease with chronic respiratory failure- follows with pulmonology, on 4L O2 at home.  -continue home oxygen  T2DM- chronic, stable. Patient on metformin 1000 mg BID, novolog 8 units w/ meals and lantus 15u daily at home. Last A1C 9/27 6.6 -CBGs AC/HS, moderate SSI  Anemia- chronic, stable, baseline appears to be 9-10, 9.7 on admission. -monitor CBC  GERD- on PPI daily -protonix daily  Constipation- chronic -miralax daily  Protein calorie malnutrition- albumin 3.1 on admit -nutrition to see  FEN/GI: carb modified heart healthy diet Prophylaxis: lovenox  Disposition: place in observation, anticipate d/c within 24-48 hours  History of Present Illness:  Makayla Vasquez is a 38 y.o. female presenting with SOB.  She reports 1 week of cough, fevers, chills, clear sputum with cough. She felt she was not improving and was persistently SOB. She  called her  rheumatologist who recommended ED evaluation given her complicated medical history. She is on 4L O2 at home and has not needed to increase this. She reports nausea and vomiting. She is unable to eat much, poor appetite, but reports good fluid intake. Her 62 year old son may have URI symptoms currently. She has pain with coughing, no chest pressure.   Review Of Systems: Per HPI with the following additions:   Review of Systems  Constitutional: Positive for chills, fever and malaise/fatigue.  HENT: Positive for sore throat. Negative for congestion and nosebleeds.   Eyes: Negative for blurred vision, double vision and photophobia.  Respiratory: Positive for cough, sputum production and shortness of breath. Negative for hemoptysis and wheezing.   Cardiovascular: Negative for chest pain, palpitations and leg swelling.  Gastrointestinal: Positive for constipation, nausea and vomiting. Negative for abdominal pain and diarrhea.  Genitourinary: Negative for dysuria.  Musculoskeletal: Positive for myalgias.  Skin: Negative for rash.  Neurological: Negative for focal weakness and headaches.  Psychiatric/Behavioral: Negative for substance abuse.    Patient Active Problem List   Diagnosis Date Noted  . CAP (community acquired pneumonia) 10/24/2018  . Right wrist pain 07/24/2018  . Anxiety state 07/21/2018  . Upper airway cough syndrome 06/18/2018  . Cor pulmonale, chronic ([place]) 04/08/2018  . Abnormal uterine bleeding 02/14/2018  . Dyspareunia, female 05/14/2017  . 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 On Cellcept therapy   . Lupus ([place])   . Long term (current) use of systemic steroids   . Chronic diastolic congestive heart failure ([place])   . Interstitial lung disease ([place])   . Anemia   . Bipolar affective disorder in remission ([place])   . Acute on chronic congestive heart failure ([place])   . 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 Adrenal insufficiency ([place])   .  Antisynthetase syndrome ([place])   . Idiopathic hypotension 07/08/2015  . Hyperlipidemia associated with type 2 diabetes mellitus ([place]) 05/04/2015  . Diabetes mellitus type 2 in obese ([place])   . Chronic respiratory failure with hypoxia ([place]) 08/07/2014  . Acute respiratory failure with hypoxia ([place]) 06/19/2014  . ILD (interstitial lung disease) ([place]) 06/19/2014  . Congestive dilated cardiomyopathy ([place]) 05/31/2014  . Chronic systolic heart failure ([place]) 02/26/2012  . PPM-Medtronic   . Congenital complete AV block   . GERD (gastroesophageal reflux disease)   . 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 Polycystic ovaries 12/17/2006    Past Medical History: Past Medical History:  Diagnosis Date  . Anxiety   . Arthritis    rheumatoid arthritis- mild, no rheumatology care   . Asthma    seasonal allergies   . Asymptomatic LV dysfunction    a. Echo in Dec 2011 with EF 35 to 40%. Felt to be due to paced rhythm. b. EF 25-30% in 07/2014.  . Bipolar affective disorder ([place])   . Cardiac pacemaker    a. Since age 53 in 74. b. Upgrade to BiV in 2013.  [name] Carpal tunnel syndrome of right wrist   . CHF (congestive heart failure) ([place])   . Congenital complete AV block   . Cor pulmonale, chronic ([place]) 04/08/2018  . Depression    bipolar  . Diabetes mellitus without complication ([place])   . GERD (gastroesophageal reflux disease)   . Hypertension   . Interstitial lung disease ([place])   . Lupus ([place])   . Lupus (systemic lupus erythematosus) ([place])   . Obesity   . Pneumonitis  a. a/w hypoxia - inflammatory - large workup 07/2014.  . Presence of permanent cardiac pacemaker   . Seizures ([place])    as a child- from high fever  . Sinus tachycardia     Past Surgical History: Past Surgical History:  Procedure Laterality Date  . ATRIAL TACH ABLATION N/A 08/14/2014   Procedure: ATRIAL TACH ABLATION;  Surgeon: [name], MD;  Location: [facility] CATH LAB;  Service: Cardiovascular;  Laterality: N/A;  .  BI-VENTRICULAR PACEMAKER UPGRADE N/A 03/08/2012   Procedure: BI-VENTRICULAR PACEMAKER UPGRADE;  Surgeon: [name], MD;  Location: [facility] CATH LAB;  Service: Cardiovascular;  Laterality: N/A;  . CARPAL TUNNEL WITH CUBITAL TUNNEL Right 07/26/2013   Procedure: RIGHT LIMITED OPEN CARPAL TUNNEL RELEASE ,  RIGHT CUBITAL TUNNEL RELEASE, INSITU VERSES ULNAR NERVE DECOMPRESSION AND ANTERIOR TRANSPOSITION;  Surgeon: [name], MD;  Location: [place] [facility];  Service: Orthopedics;  Laterality: Right;  . CESAREAN SECTION    . CHOLECYSTECTOMY    . CYST EXCISION  12/10/2012   THYROID  . INSERT / REPLACE / REMOVE PACEMAKER     2001  . IUD REMOVAL  11/03/2011   Procedure: INTRAUTERINE DEVICE (IUD) REMOVAL;  Surgeon: [name], MD;  Location: [facility];  Service: Gynecology;  Laterality: N/A;  . NASAL FRACTURE SURGERY     /w plate   . RIGHT HEART CATHETERIZATION N/A 10/26/2014   Procedure: RIGHT HEART CATH;  Surgeon: [name], MD;  Location: [facility] CATH LAB;  Service: Cardiovascular;  Laterality: N/A;  . THROAT SURGERY  1994   s/p laser treatment  . THYROGLOSSAL DUCT CYST N/A 12/10/2012   Procedure: REVISION OF THYROGLOSSAL DUCT CYST EXCISION;  Surgeon: [name], MD;  Location: [facility];  Service: ENT;  Laterality: N/A;  Revision of Thyroglossal Duct Cyst Excision  . VIDEO BRONCHOSCOPY Bilateral 06/19/2014   Procedure: VIDEO BRONCHOSCOPY WITHOUT FLUORO;  Surgeon: Brand Males, MD;  Location: [facility];  Service: Cardiopulmonary;  Laterality: Bilateral;    Social History: Social History   Tobacco Use  . Smoking status: Former Smoker    Packs/day: 0.25    Years: 0.50    Pack years: 0.12    Types: Cigarettes    Last attempt to quit: 07/25/1996    Years since quitting: 22.2  . Smokeless tobacco: Never Used  Substance Use Topics  . Alcohol use: No  . Drug use: No   Additional social history: lives with 71 yo son  Please also refer to relevant sections of EMR.  Family History: Family History   Problem Relation Age of Onset  . Heart disease Mother        CHF (no details)  . Hypertension Mother   . Lupus Mother   . Heart disease Father        Murmur  . Heart disease Sister 83        No details.  History of a pacemaker   Allergies and Medications: Allergies  Allergen Reactions  . 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 Sertraline Hcl Hives  . Tape Other (See Comments)    Burns skin   No current facility-administered medications on file prior to encounter.    Current Outpatient Medications on File Prior to Encounter  Medication Sig Dispense Refill  . azithromycin (ZITHROMAX) 250 MG tablet 2 today then one daily 6 tablet 0  . dicyclomine (BENTYL) 20 MG tablet Take 20 mg by mouth 4 (four) times  daily -  before meals and at bedtime.     . Etonogestrel (NEXPLANON [place]) Inject 1 application into the skin once.     . fluticasone (FLONASE) 50 MCG/ACT nasal spray Place 1 spray into both nostrils daily as needed for allergies or rhinitis.    . furosemide (LASIX) 40 MG tablet TAKE 1 TABLET BY MOUTH ONCE DAILY 30 tablet 5  . guaiFENesin-dextromethorphan (ROBITUSSIN DM) 100-10 MG/5ML syrup Take 5 mLs by mouth every 4 (four) hours as needed for cough. 118 mL 0  . LANTUS SOLOSTAR 100 UNIT/ML Solostar Pen INJECT 15 UNITS SUBCUTANEOUSLY EVERY DAY (Patient taking differently: Inject 15 Units into the skin daily as needed (High blood sugar). ) 15 pen 2  . metFORMIN (GLUCOPHAGE) 1000 MG tablet Take 1 tablet (1,000 mg total) by mouth 2 (two) times daily with a meal. 90 tablet 3  . metoprolol succinate (TOPROL XL) 25 MG 24 hr tablet Take 1 tablet (25 mg total) by mouth at bedtime. 30 tablet 11  . mycophenolate (CELLCEPT) 500 MG tablet Take 1,000 mg by mouth 2 (two) times daily.     [name] NOVOLOG FLEXPEN 100 UNIT/ML FlexPen INJECT 8 UNITS INTO THE SKIN 3 (THREE) TIMES DAILY WITH MEALS. (Patient taking differently: Inject 8 Units into  the skin 3 (three) times daily with meals. ) 15 pen 5  . omeprazole (PRILOSEC) 20 MG capsule Take 1 capsule (20 mg total) by mouth daily. 30 capsule 4  . OXYGEN Inhale 2-4 L into the lungs.     . potassium chloride SA (K-DUR,KLOR-CON) 20 MEQ tablet Take 1 tablet (20 mEq total) by mouth daily. 30 tablet 11  . predniSONE (DELTASONE) 10 MG tablet 4 tabs for 2 days, then 3 tabs for 2 days, 2 tabs for 2 days, then 1 tab for 2 days, then stop 20 tablet 0  . promethazine-codeine (PHENERGAN WITH CODEINE) 6.25-10 MG/5ML syrup Take 5 mLs by mouth every 6 (six) hours as needed for cough. 180 mL 0  . tacrolimus (PROGRAF) 0.5 MG capsule Take 1.5 mg by mouth 2 (two) times daily.    . traZODone (DESYREL) 50 MG tablet TAKE 1 TABLET (50 MG TOTAL) BY MOUTH AT BEDTIME AS NEEDED FOR SLEEP. 90 tablet 2    Objective: BP 98/67   Pulse 85   Temp 98.1 F (36.7 C) (Oral)   Resp (!) 25   Ht 5' 5"  (1.651 m)   Wt 61.2 kg   SpO2 100%   BMI 22.47 kg/m  Exam: General: thin, well appearing, in NAD, [place] in place Eyes: EOMI ENTM: MMM, no erythema or exudate in posterior oropharynx Neck: supple, non-tender, no LAD Cardiovascular: RRR, no MRG, +2 radial and DP pulses bilaterally  Respiratory: CTAB. No increased work of breathing. Gastrointestinal: soft, non-tender, non-distended, +BS MSK: thin extremities, no edema or cyanosis noted, no joint swelling Derm: no rashes or lesions appreciated Neuro: no focal deficits Psych: AOx3, mood and affect normal  Labs and Imaging: CBC BMET  Recent Labs  Lab 10/24/18 0131  WBC 9.2  HGB 9.7*  HCT 35.3*  PLT 294   Recent Labs  Lab 10/24/18 0131  NA 137  K 3.6  CL 104  CO2 23  BUN 10  CREATININE 0.73  GLUCOSE 135*  CALCIUM 8.4*     Lactic Acid, Venous    Component Value Date/Time   LATICACIDVEN 1.13 10/24/2018 0408   BNP    Component Value Date/Time   BNP 41.4 10/24/2018 0136    Dg Chest  2 View  Result Date: 10/24/2018 CLINICAL DATA:  Acute onset of  shortness of breath. Fever, chills and body aches. EXAM: CHEST - 2 VIEW COMPARISON:  Chest radiograph 07/24/2018, and high-resolution CT of the chest performed 09/21/2018 FINDINGS: The lungs are well-aerated. 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 There may be mild superimposed right peripheral mid lung and left midlung airspace opacity, which could reflect pneumonia. There is no evidence of pleural effusion or pneumothorax. The heart is borderline normal in size. A pacemaker is noted at the left chest wall, with leads ending at the right atrium, right ventricle and coronary sinus. No acute osseous abnormalities are seen. IMPRESSION: 1. 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 2. Suspect mild superimposed right peripheral mid lung and left midlung airspace opacity, which could reflect pneumonia. Electronically Signed   By: Garald Balding M.D.   On: 10/24/2018 02:46   Dg Wrist Complete Right  Result Date: 10/24/2018 CLINICAL DATA:  Acute onset of right wrist pain.  Initial encounter. EXAM: RIGHT WRIST - COMPLETE 3+ VIEW COMPARISON:  Right wrist radiographs performed 07/25/2018 FINDINGS: There is no evidence of fracture or dislocation. The carpal rows are intact, and demonstrate normal alignment. The joint spaces are preserved. No significant soft tissue abnormalities are seen. A peripheral IV catheter is noted overlying the radial aspect of the wrist. IMPRESSION: No evidence of fracture or dislocation. Electronically Signed   By: Garald Balding M.D.   On: 10/24/2018 02:47     Steve Rattler, DO 10/24/2018, 5:30 AM PGY-3, [place] [facility] pager: [phone], text pages welcome

## 2018-10-24 NOTE — ED Provider Notes (Signed)
[facility] Provider Note   CSN: [id number] Arrival date & time: 10/24/18  0103     History   Chief Complaint Chief Complaint  Patient presents with  . Shortness of Breath    HPI Makayla Vasquez is a [age] y.o. female with a hx of anti-synthetase syndrome, myositis, anxiety, rheumatoid arthritis, asthma, CHF (EF 25-30% in 2015 with pacemaker insertion in 1997), IDDM, interstitial lung disease, hypertension, lupus presents to the Emergency Department complaining of gradual, persistent, progressively worsening fevers, chills, cough, congestion, shortness of breath onset approximately 1 week ago.  Patient also reports associated nausea and intermittent emesis.  And has not measured her fever at home.  Patient reports worsening shortness of breath when she lies flat, sitting up does help.  She is on chronic oxygen at 4 L/min via nasal cannula.  She reports she has not increased this.  No evaluation prior to arrival.  Patient denies headache, neck pain, abdominal pain, syncope, dysuria.  Patient does not regularly menstruate.  No known sick contacts.  Pt is taking Cellcept and Prograf  The history is provided by the patient and medical records. No language interpreter was used.    Past Medical History:  Diagnosis Date  . Anxiety   . Arthritis    rheumatoid arthritis- mild, no rheumatology care   . Asthma    seasonal allergies   . Asymptomatic LV dysfunction    a. Echo in Dec 2011 with EF 35 to 40%. Felt to be due to paced rhythm. b. EF 25-30% in 07/2014.  . Bipolar affective disorder (HCC)   . Cardiac pacemaker    a. Since age 38 in [id number]. b. Upgrade to BiV in 2013.  [name]. Carpal tunnel syndrome of right wrist   . CHF (congestive heart failure) (HCC)   . Congenital complete AV block   . Cor pulmonale, chronic (HCC) 04/08/2018  . Depression    bipolar  . Diabetes mellitus without complication (HCC)   . GERD (gastroesophageal reflux disease)   .  Hypertension   . Interstitial lung disease (HCC)   . Lupus (HCC)   . Lupus (systemic lupus erythematosus) (HCC)   . Obesity   . Pneumonitis    a. a/w hypoxia - inflammatory - large workup 07/2014.  . Presence of permanent cardiac pacemaker   . Seizures (HCC)    as a child- from high fever  . Sinus tachycardia     Patient Active Problem List   Diagnosis Date Noted  . CAP (community acquired pneumonia) 10/24/2018  . Right wrist pain 07/24/2018  . Anxiety state 07/21/2018  . UTI due to Klebsiella species   . Sepsis due to Klebsiella (HCC) 07/14/2018  . Upper airway cough syndrome 06/18/2018  . Cor pulmonale, chronic (HCC) 04/08/2018  . Abnormal uterine bleeding 02/14/2018  . Dyspareunia, female 05/14/2017  . 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 On Cellcept therapy   . Lupus (HCC)   . Long term (current) use of systemic steroids   . Chronic diastolic congestive heart failure (HCC)   . Interstitial lung disease (HCC)   . Anemia   . Bipolar affective disorder in remission (HCC)   . Acute on chronic congestive heart failure (HCC)   . 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 Adrenal insufficiency (HCC)   . Antisynthetase syndrome (HCC)   . Idiopathic hypotension 07/08/2015  . Hyperlipidemia associated with type 2 diabetes mellitus (HCC) 05/04/2015  . Diabetes mellitus type 2 in  obese (HCC)   . Chronic respiratory failure with hypoxia (HCC) 08/07/2014  . Acute respiratory failure with hypoxia (HCC) 06/19/2014  . ILD (interstitial lung disease) (HCC) 06/19/2014  . Congestive dilated cardiomyopathy (HCC) 05/31/2014  . Chronic systolic heart failure (HCC) 02/26/2012  . PPM-Medtronic   . Congenital complete AV block   . GERD (gastroesophageal reflux disease)   . 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 Polycystic ovaries 12/17/2006    Past Surgical History:  Procedure Laterality Date  . ATRIAL TACH ABLATION N/A 08/14/2014   Procedure: ATRIAL TACH ABLATION;   Surgeon: Marinus Maw, MD;  Location: [facility] CATH LAB;  Service: Cardiovascular;  Laterality: N/A;  . BI-VENTRICULAR PACEMAKER UPGRADE N/A 03/08/2012   Procedure: BI-VENTRICULAR PACEMAKER UPGRADE;  Surgeon: Marinus Maw, MD;  Location: [facility] CATH LAB;  Service: Cardiovascular;  Laterality: N/A;  . CARPAL TUNNEL WITH CUBITAL TUNNEL Right 07/26/2013   Procedure: RIGHT LIMITED OPEN CARPAL TUNNEL RELEASE ,  RIGHT CUBITAL TUNNEL RELEASE, INSITU VERSES ULNAR NERVE DECOMPRESSION AND ANTERIOR TRANSPOSITION;  Surgeon: Dominica Severin, MD;  Location: [facility] OR;  Service: Orthopedics;  Laterality: Right;  . CESAREAN SECTION    . CHOLECYSTECTOMY    . CYST EXCISION  12/10/2012   THYROID  . INSERT / REPLACE / REMOVE PACEMAKER     2001  . IUD REMOVAL  11/03/2011   Procedure: INTRAUTERINE DEVICE (IUD) REMOVAL;  Surgeon: Myra C. Marice Potter, MD;  Location: [facility] ORS;  Service: Gynecology;  Laterality: N/A;  . NASAL FRACTURE SURGERY     /w plate   . RIGHT HEART CATHETERIZATION N/A 10/26/2014   Procedure: RIGHT HEART CATH;  Surgeon: Dolores Patty, MD;  Location[facility] LAB;  Service: Cardiovascular;  Laterality: N/A;  . THROAT SURGERY  1994   s/p laser treatment  . THYROGLOSSAL DUCT CYST N/A 12/10/2012   Procedure: REVISION OF THYROGLOSSAL DUCT CYST EXCISION;  Surgeon: Serena Colonel, MD;  Location: [facility] OR;  Service: ENT;  Laterality: N/A;  Revision of Thyroglossal Duct Cyst Excision  . VIDEO BRONCHOSCOPY Bilateral 06/19/2014   Procedure: VIDEO BRONCHOSCOPY WITHOUT FLUORO;  Surgeon: Kalman Shan, MD;  Location: [facility] ENDOSCOPY;  Service: Cardiopulmonary;  Laterality: Bilateral;     OB History    Gravida  1   Para  1   Term  1   Preterm      AB      Living  1     SAB      TAB      Ectopic      Multiple      Live Births               Home Medications    Prior to Admission medications   Medication Sig Start Date End Date Taking? Authorizing Provider  azithromycin (ZITHROMAX) 250 MG tablet 2 today then  one daily 10/06/18   Jetty Duhamel D, MD  dicyclomine (BENTYL) 20 MG tablet Take 20 mg by mouth 4 (four) times daily -  before meals and at bedtime.     [provider]  Etonogestrel (NEXPLANON [place]) Inject 1 application into the skin once.     [provider]  fluticasone (FLONASE) 50 MCG/ACT nasal spray Place 1 spray into both nostrils daily as needed for allergies or rhinitis.    [provider]  furosemide (LASIX) 40 MG tablet TAKE 1 TABLET BY MOUTH ONCE DAILY 07/29/18   Laurey Morale, MD  guaiFENesin-dextromethorphan [facility] DM) 100-10 MG/5ML syrup Take 5 mLs by mouth every  4 (four) hours as needed for cough. 07/26/18   Anderson, Chelsey L, DO  LANTUS SOLOSTAR 100 UNIT/ML Solostar Pen INJECT 15 UNITS SUBCUTANEOUSLY EVERY DAY Patient taking differently: Inject 15 Units into the skin daily as needed (High blood sugar).  04/26/18   Wendee Beavers, DO  metFORMIN (GLUCOPHAGE) 1000 MG tablet Take 1 tablet (1,000 mg total) by mouth 2 (two) times daily with a meal. 10/22/18   Wendee Beavers, DO  metoprolol succinate (TOPROL XL) 25 MG 24 hr tablet Take 1 tablet (25 mg total) by mouth at bedtime. 08/02/18 08/02/19  Alford Highland, NP  mycophenolate (CELLCEPT) 500 MG tablet Take 1,000 mg by mouth 2 (two) times daily.     [provider]  NOVOLOG FLEXPEN 100 UNIT/ML FlexPen INJECT 8 UNITS INTO THE SKIN 3 (THREE) TIMES DAILY WITH MEALS. Patient taking differently: Inject 8 Units into the skin 3 (three) times daily with meals.  09/30/17   Marquette Saa, MD  omeprazole (PRILOSEC) 20 MG capsule Take 1 capsule (20 mg total) by mouth daily. 09/03/18   Coral Ceo, NP  OXYGEN Inhale 2-4 L into the lungs.     [provider]  potassium chloride SA (K-DUR,KLOR-CON) 20 MEQ tablet Take 1 tablet (20 mEq total) by mouth daily. 09/02/18   Bensimhon, Bevelyn Buckles, MD  predniSONE (DELTASONE) 10 MG tablet 4 tabs for 2 days, then 3 tabs for 2 days, 2 tabs for 2  days, then 1 tab for 2 days, then stop 09/03/18   Coral Ceo, NP  promethazine-codeine (PHENERGAN WITH CODEINE) 6.25-10 MG/5ML syrup Take 5 mLs by mouth every 6 (six) hours as needed for cough. 10/07/18   Jetty Duhamel D, MD  tacrolimus (PROGRAF) 0.5 MG capsule Take 1.5 mg by mouth 2 (two) times daily. 11/27/17   [provider]  traZODone (DESYREL) 50 MG tablet TAKE 1 TABLET (50 MG TOTAL) BY MOUTH AT BEDTIME AS NEEDED FOR SLEEP. 09/27/18   Wendee Beavers, DO    Family History Family History  Problem Relation Age of Onset  . Heart disease Mother        CHF (no details)  . Hypertension Mother   . Lupus Mother   . Heart disease Father        Murmur  . Heart disease Sister 62        No details.  History of a pacemaker    Social History Social History   Tobacco Use  . Smoking status: Former Smoker    Packs/day: 0.25    Years: 0.50    Pack years: 0.12    Types: Cigarettes    Last attempt to quit: 07/25/1996    Years since quitting: 22.2  . Smokeless tobacco: Never Used  Substance Use Topics  . Alcohol use: No  . Drug use: No     Allergies   Atorvastatin; Sertraline hcl; and Tape   Review of Systems Review of Systems  Constitutional: Positive for chills, fatigue and fever. Negative for appetite change, diaphoresis and unexpected weight change.  HENT: Positive for congestion, postnasal drip, rhinorrhea, sinus pressure and sore throat. Negative for ear discharge, ear pain and mouth sores.   Eyes: Negative for visual disturbance.  Respiratory: Positive for cough, chest tightness, shortness of breath and wheezing. Negative for stridor.   Cardiovascular: Negative for chest pain, palpitations and leg swelling.  Gastrointestinal: Negative for abdominal pain, constipation, diarrhea, nausea and vomiting.  Endocrine: Negative for polydipsia, polyphagia and polyuria.  Genitourinary:  Negative for dysuria, frequency, hematuria and urgency.  Musculoskeletal: Negative for  arthralgias, back pain, myalgias and neck stiffness.  Skin: Negative for rash.  Allergic/Immunologic: Negative for immunocompromised state.  Neurological: Negative for syncope, light-headedness, numbness and headaches.  Hematological: Negative for adenopathy. Does not bruise/bleed easily.  Psychiatric/Behavioral: Negative for sleep disturbance. The patient is not nervous/anxious.   All other systems reviewed and are negative.    Physical Exam Updated Vital Signs Ht 5\' 5"  (1.651 m)   Wt 61.2 kg   BMI 22.47 kg/m   Physical Exam Vitals signs and nursing note reviewed.  Constitutional:      General: She is not in acute distress.    Appearance: She is well-developed. She is not diaphoretic.     Comments: Awake, alert,  Chronically ill-appearing  HENT:     Head: Normocephalic and atraumatic.     Mouth/Throat:     Pharynx: No oropharyngeal exudate.  Eyes:     General: No scleral icterus.    Conjunctiva/sclera: Conjunctivae normal.  Neck:     Musculoskeletal: Normal range of motion and neck supple.  Cardiovascular:     Rate and Rhythm: Regular rhythm. Tachycardia present.     Pulses:          Radial pulses are 2+ on the right side and 2+ on the left side.  Pulmonary:     Effort: No respiratory distress.     Breath sounds: Decreased breath sounds (throughout) and wheezing ( Throughout) present. No rhonchi.     Comments: Mild increased work of breathing Abdominal:     General: Bowel sounds are normal.     Palpations: Abdomen is soft. There is no mass.     Tenderness: There is no abdominal tenderness. There is no guarding or rebound.  Musculoskeletal: Normal range of motion.     Right lower leg: Edema ( trace, nonpitting) present.     Left lower leg: Edema (trace, nonpitting) present.  Skin:    General: Skin is warm and dry.  Neurological:     Mental Status: She is alert.     Comments: Speech is clear and goal oriented Moves extremities without ataxia      ED Treatments  / Results  Labs (all labs ordered are listed, but only abnormal results are displayed) Labs Reviewed  COMPREHENSIVE METABOLIC PANEL - Abnormal; Notable for the following components:      Result Value   Glucose, Bld 135 (*)    Calcium 8.4 (*)    Albumin 3.1 (*)    All other components within normal limits  CBC WITH DIFFERENTIAL/PLATELET - Abnormal; Notable for the following components:   Hemoglobin 9.7 (*)    HCT 35.3 (*)    MCH 22.3 (*)    MCHC 27.5 (*)    RDW 17.5 (*)    All other components within normal limits  URINALYSIS, ROUTINE W REFLEX MICROSCOPIC - Abnormal; Notable for the following components:   APPearance HAZY (*)    All other components within normal limits  MAGNESIUM - Abnormal; Notable for the following components:   Magnesium 1.2 (*)    All other components within normal limits  I-STAT CG4 LACTIC ACID, ED - Abnormal; Notable for the following components:   Lactic Acid, Venous 2.37 (*)    All other components within normal limits  CULTURE, BLOOD (ROUTINE X 2)  CULTURE, BLOOD (ROUTINE X 2)  INFLUENZA PANEL BY PCR (TYPE A & B)  BRAIN NATRIURETIC PEPTIDE  I-STAT TROPONIN, ED  I-STAT  BETA HCG BLOOD, ED ([facility], [facility], [facility] ONLY)  I-STAT CG4 LACTIC ACID, ED    EKG EKG Interpretation  Date/Time:  Sunday October 24 2018 01:28:12 EST Ventricular Rate:  109 PR Interval:    QRS Duration: 113 QT Interval:  369 QTC Calculation: 497 R Axis:   -138 Text Interpretation:  Sinus tachycardia Anterolateral infarct, old No significant change since last tracing Confirmed by Drema Pry [phone]) on 10/24/2018 1:48:20 AM   Radiology Dg Chest 2 View  Result Date: 10/24/2018 CLINICAL DATA:  Acute onset of shortness of breath. Fever, chills and body aches. EXAM: CHEST - 2 VIEW COMPARISON:  Chest radiograph 07/24/2018, and high-resolution CT of the chest performed 09/21/2018 FINDINGS: The lungs are well-aerated. 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 There may be mild superimposed right peripheral mid lung and left midlung airspace opacity, which could reflect pneumonia. There is no evidence of pleural effusion or pneumothorax. The heart is borderline normal in size. A pacemaker is noted at the left chest wall, with leads ending at the right atrium, right ventricle and coronary sinus. No acute osseous abnormalities are seen. IMPRESSION: 1. 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 2. Suspect mild superimposed right peripheral mid lung and left midlung airspace opacity, which could reflect pneumonia. Electronically Signed   By: Roanna Raider M.D.   On: 10/24/2018 02:46   Dg Wrist Complete Right  Result Date: 10/24/2018 CLINICAL DATA:  Acute onset of right wrist pain.  Initial encounter. EXAM: RIGHT WRIST - COMPLETE 3+ VIEW COMPARISON:  Right wrist radiographs performed 07/25/2018 FINDINGS: There is no evidence of fracture or dislocation. The carpal rows are intact, and demonstrate normal alignment. The joint spaces are preserved. No significant soft tissue abnormalities are seen. A peripheral IV catheter is noted overlying the radial aspect of the wrist. IMPRESSION: No evidence of fracture or dislocation. Electronically Signed   By: Roanna Raider M.D.   On: 10/24/2018 02:47    Procedures Procedures (including critical care time)  Medications Ordered in ED Medications  0.9 %  sodium chloride infusion ( Intravenous New Bag/Given 10/24/18 0246)  azithromycin (ZITHROMAX) 500 mg in sodium chloride 0.9 % 250 mL IVPB (500 mg Intravenous New Bag/Given 10/24/18 0345)  acetaminophen (TYLENOL) tablet 1,000 mg (1,000 mg Oral Given 10/24/18 0158)  magnesium sulfate IVPB 2 g 50 mL (2 g Intravenous New Bag/Given 10/24/18 0322)  cefTRIAXone (ROCEPHIN) 1 g in sodium chloride 0.9 % 100 mL IVPB (1 g Intravenous New Bag/Given 10/24/18 0346)    albuterol (PROVENTIL) (2.5 MG/3ML) 0.083% nebulizer solution 2.5 mg (2.5 mg Nebulization Given 10/24/18 0325)     Initial Impression / Assessment and Plan / ED Course  I have reviewed the triage vital signs and the nursing notes.  Pertinent labs & imaging results that were available during my care of the patient were reviewed by me and considered in my medical decision making (see chart for details).  Clinical Course as of Oct 24 437  Sun Oct 24, 2018  0144 Requested that I call her rheumatologist for further direction upon her arrival here in the emergency department.  Discussed the situation with Cheral Bay, MD, pt's rheumatologist, who did not recommend anything specific for this work-up.  He does report that patient has been complaining of right wrist pain for some time and was supposed to obtain x-ray looking for osteomyelitis but had not yet  done so and is immunocompromised due to her immunosuppressive medications.   [HM]    Clinical Course User Index [HM] Meggin Ola, Boyd Kerbs    Patient presents with influenza-like illness including fevers, cough, nasal congestion, sore throat.  In addition she has had vomiting and diarrhea for several days.  She reports she cannot keep anything down.  Patient is immunocompromised as she takes immunosuppressive's for her chronic conditions.  She does have a history of congestive heart failure.  Mild wheezing on exam.  Albuterol given.  Patient is without leukocytosis but does have elevated lactic acid.  Serum creatinine is within normal limits.  Hemoglobin appears to be largely baseline.  Patient is febrile on arrival.  In addition she is tachypneic.  Troponin is negative and BNP is within normal limits.  Chest x-ray shows likely pneumonia.  Suspect influenza-like illness and pneumonia other cause of her symptoms.  Less likely to be CHF exacerbation at this time.  Patient will need admission for IV antibiotics due to her immunocompromise  state  The patient was discussed with and seen by Dr. Eudelia Bunch who agrees with the treatment plan.   Final Clinical Impressions(s) / ED Diagnoses   Final diagnoses:  Community acquired pneumonia, unspecified laterality  Sepsis without acute organ dysfunction, due to unspecified organism [facility])  Fever, unspecified fever cause    ED Discharge Orders    None       Mardene Sayer Boyd Kerbs 10/24/18 9528    Nira Conn, MD 10/24/18 0630

## 2018-10-24 NOTE — ED Triage Notes (Signed)
Pt arrived via [facility]; pt from [facility] with  c/o increased SOB x 1 wk, fever, chills, and body aches x 3 days; abd distended and pt w/ complete AV block; EMS reports clr lung sounds; 140/80 after 64mL NS, initially 98/40, CBG 232, Pt 95-96% on 4L O2 which she normally wears; Pt rec'd 4mg  of zofran

## 2018-10-24 NOTE — Discharge Summary (Signed)
[facility] Discharge Summary  Patient name: Makayla Vasquez Medical record number: [id number] Date of birth: 10/23/1980 Age: 38 y.o. 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 female in NAD Cardio: RRR no m/r/g Lungs: No wheezing, no crackles, transient rhonchi that resolved with cough, no increased work of breathing, good air movement throughout, on 4 L O2 per [place] Abdomen: Soft, non-tender to palpation, positive bowel sounds Skin: warm and dry Extremities: No edema  Brief Hospital Course:  Makayla Vasquez is a 38 y.o. female who presented with shortness of breath . PMH is significant for SLE, anti-synthetase syndrome, myositis,anxiety, rheumatoid arthritis, asthma, CHF(EF 25-30% in 2015 with pacemaker insertion in 1997),IDDM, interstitial lung disease, and hypertension.  Her hospital course is outlined below.  Patient met sepsis criteria with fever, tachycardia, hypotension on admission.  Chest x-ray suggestive of mild superimposed right midlung and left midlung pneumonia.  Her flu test was negative.  Her initial lactic acid was 2.37 but improved with fluid resuscitation.  White blood cell count was normal.  She did not have an increased oxygen requirement, and was stable on 4 L O2 per nasal cannula, her home dose, throughout her hospitalization.  She received ceftriaxone and  azithromycin in the ED.  She remained afebrile throughout the remainder of her hospitalization and white blood cell count remained within normal limits.  She was transitioned to p.o. cefdinir and was monitored for 24 hours.  She remained afebrile with stable vital signs and on her home O2 of 4 L.  She was discharged home to complete a total of 7 days of antibiotics and will finish her cefdinir on 10/29/2018.  She was also discharged home to complete a 5-day course of prednisone stress dose to 30 mg daily, which she will complete on 10/29/2018.  She will then return to her home dose of prednisone 10 mg daily.  Per rheumatology recommendation, the patient's tacrolimus and CellCept were held due to her pneumonia.  She was continued on her home prednisone 10 mg daily and due to reassuring overall appearance was not stress dosed.  She was given instructions to resume tacrolimus and CellCept on discharge.  Patient reported right wrist pain on admission, per rheumatology recommendation she received an x-ray which was negative.  Patient did not show any swelling at that joint during her admission.  Pain resolved during admission.  Issues for Follow Up:  1. Need to ensure that symptoms have resolved and patient has completed a course of antibiotics as well as stress dose steroids.  Ensure that patient is restarted home dose of prednisone 10 mg daily. 2. Ensure that patient has resumed tacrolimus and CellCept treatment.  Significant Procedures: None  Significant Labs and Imaging:  Recent Labs  Lab 10/24/18 0131 10/24/18 0734 10/25/18 0534  WBC 9.2 6.6 5.3  HGB 9.7* 7.9* 8.4*  HCT 35.3* 27.7* 28.5*  PLT 294 274 291   Recent Labs  Lab 10/24/18 0131 10/24/18 0734 10/25/18 0534  NA 137 138 QUESTIONABLE RESULTS, RECOMMEND RECOLLECT  TO VERIFY  K 3.6 3.6 QUESTIONABLE RESULTS, RECOMMEND RECOLLECT TO VERIFY  CL 104 102 QUESTIONABLE RESULTS, RECOMMEND RECOLLECT TO VERIFY  CO2 23 28 QUESTIONABLE RESULTS,  RECOMMEND RECOLLECT TO VERIFY  GLUCOSE 135* 148* QUESTIONABLE RESULTS, RECOMMEND RECOLLECT TO VERIFY  BUN 10 9 QUESTIONABLE RESULTS, RECOMMEND RECOLLECT TO VERIFY  CREATININE 0.73 0.72 QUESTIONABLE RESULTS, RECOMMEND RECOLLECT TO VERIFY  CALCIUM 8.4* 8.0* QUESTIONABLE RESULTS, RECOMMEND RECOLLECT TO VERIFY  MG 1.2*  --   --   ALKPHOS 57  --   --   AST 19  --   --   ALT 11  --   --   ALBUMIN 3.1*  --   --     Dg Chest 2 View  Result Date: 10/24/2018 CLINICAL DATA:  Acute onset of shortness of breath. Fever, chills and body aches. EXAM: CHEST - 2 VIEW COMPARISON:  Chest radiograph 07/24/2018, and high-resolution CT of the chest performed 09/21/2018 FINDINGS: The lungs are well-aerated. 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 There may be mild superimposed right peripheral mid lung and left midlung airspace opacity, which could reflect pneumonia. There is no evidence of pleural effusion or pneumothorax. The heart is borderline normal in size. A pacemaker is noted at the left chest wall, with leads ending at the right atrium, right ventricle and coronary sinus. No acute osseous abnormalities are seen. IMPRESSION: 1. 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 2. Suspect mild superimposed right peripheral mid lung and left midlung airspace opacity, which could reflect pneumonia. Electronically Signed   By: Garald Balding M.D.   On: 10/24/2018 02:46   Dg Wrist Complete Right  Result Date: 10/24/2018 CLINICAL DATA:  Acute onset of right wrist pain.  Initial encounter. EXAM: RIGHT WRIST - COMPLETE 3+ VIEW COMPARISON:  Right wrist radiographs performed 07/25/2018 FINDINGS: There is no evidence of fracture or dislocation. The carpal rows are intact, and demonstrate normal alignment. The joint spaces are preserved. No significant soft tissue  abnormalities are seen. A peripheral IV catheter is noted overlying the radial aspect of the wrist. IMPRESSION: No evidence of fracture or dislocation. Electronically Signed   By: Garald Balding M.D.   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   dicyclomine 20 MG tablet Commonly known as:  BENTYL Take 20 mg by mouth 4 (four) times daily -  before meals and at bedtime.   fluticasone 50 MCG/ACT nasal spray Commonly known as:  FLONASE Place 1 spray into both nostrils daily as needed for allergies or rhinitis.   furosemide 40 MG tablet Commonly known as:  LASIX TAKE 1 TABLET BY MOUTH ONCE DAILY   guaiFENesin-dextromethorphan 100-10 MG/5ML syrup Commonly known  as:  ROBITUSSIN DM Take 5 mLs by mouth every 4 (four) hours as needed for cough.   LANTUS SOLOSTAR 100 UNIT/ML Solostar Pen Generic drug:  Insulin Glargine INJECT 15 UNITS SUBCUTANEOUSLY EVERY DAY What changed:  See the new instructions.   metFORMIN 1000 MG tablet Commonly known as:  GLUCOPHAGE Take 1 tablet (1,000 mg total) by mouth 2 (two) times daily with a meal.   metoprolol succinate 25 MG 24 hr tablet Commonly known as:  TOPROL XL Take 1 tablet (25 mg total) by mouth at bedtime.   mycophenolate 500 MG tablet Commonly known as:  CELLCEPT Take 1,000 mg by mouth 2 (two) times daily.    NEXPLANON [place] Inject 1 application into the skin once.   NOVOLOG FLEXPEN 100 UNIT/ML FlexPen Generic drug:  insulin aspart INJECT 8 UNITS INTO THE SKIN 3 (THREE) TIMES DAILY WITH MEALS. What changed:  See the new instructions.   omeprazole 20 MG capsule Commonly known as:  PRILOSEC Take 1 capsule (20 mg total) by mouth daily.   OXYGEN Inhale 2-4 L into the lungs.   potassium chloride SA 20 MEQ tablet Commonly known as:  K-DUR,KLOR-CON Take 1 tablet (20 mEq total) by mouth daily.   predniSONE 10 MG tablet Commonly known as:  DELTASONE Take 10 mg by mouth daily with breakfast. What changed:  Another medication with the same name was changed. Make sure you understand how and when to take each.   predniSONE 10 MG tablet Commonly known as:  DELTASONE Take 3 tablets (30 mg total) by mouth daily with breakfast. Start taking on:  October 26, 2018 What changed:    how much to take  how to take this  when to take this  additional instructions   tacrolimus 0.5 MG capsule Commonly known as:  PROGRAF Take 1.5 mg by mouth 2 (two) times daily.   traZODone 50 MG tablet Commonly known as:  DESYREL TAKE 1 TABLET (50 MG TOTAL) BY MOUTH AT BEDTIME AS NEEDED FOR SLEEP.       Discharge Instructions: Please refer to Patient Instructions section of EMR for full details.  Patient was counseled important signs and symptoms that should prompt return to medical care, changes in medications, dietary instructions, activity restrictions, and follow up appointments.   Follow-Up Appointments: Follow-up Information    [facility]. Go on 10/29/2018.   Why:  Appointment is at 9:10 AM. Please arrive 15 mins early and bring meds. Contact information: [address]          Cleophas Dunker, DO 10/25/2018, 4:43 PM PGY-1, [place]

## 2018-10-25 DIAGNOSIS — T380X5A Adverse effect of glucocorticoids and synthetic analogues, initial encounter: Secondary | ICD-10-CM

## 2018-10-25 DIAGNOSIS — J9611 Chronic respiratory failure with hypoxia: Secondary | ICD-10-CM | POA: Diagnosis not present

## 2018-10-25 DIAGNOSIS — Z7952 Long term (current) use of systemic steroids: Secondary | ICD-10-CM | POA: Diagnosis not present

## 2018-10-25 DIAGNOSIS — M3213 Lung involvement in systemic lupus erythematosus: Secondary | ICD-10-CM | POA: Diagnosis not present

## 2018-10-25 DIAGNOSIS — E099 Drug or chemical induced diabetes mellitus without complications: Secondary | ICD-10-CM | POA: Diagnosis not present

## 2018-10-25 DIAGNOSIS — J849 Interstitial pulmonary disease, unspecified: Secondary | ICD-10-CM | POA: Diagnosis not present

## 2018-10-25 DIAGNOSIS — D899 Disorder involving the immune mechanism, unspecified: Secondary | ICD-10-CM | POA: Diagnosis not present

## 2018-10-25 DIAGNOSIS — J189 Pneumonia, unspecified organism: Secondary | ICD-10-CM | POA: Diagnosis not present

## 2018-10-25 LAB — BASIC METABOLIC PANEL

## 2018-10-25 LAB — CBC
HCT: 28.5 % — ABNORMAL LOW (ref 36.0–46.0)
Hemoglobin: 8.4 g/dL — ABNORMAL LOW (ref 12.0–15.0)
MCH: 23.5 pg — ABNORMAL LOW (ref 26.0–34.0)
MCHC: 29.5 g/dL — ABNORMAL LOW (ref 30.0–36.0)
MCV: 79.8 fL — ABNORMAL LOW (ref 80.0–100.0)
PLATELETS: 291 10*3/uL (ref 150–400)
RBC: 3.57 MIL/uL — ABNORMAL LOW (ref 3.87–5.11)
RDW: 17.5 % — ABNORMAL HIGH (ref 11.5–15.5)
WBC: 5.3 10*3/uL (ref 4.0–10.5)
nRBC: 0 % (ref 0.0–0.2)

## 2018-10-25 LAB — GLUCOSE, CAPILLARY
GLUCOSE-CAPILLARY: 104 mg/dL — AB (ref 70–99)
Glucose-Capillary: 152 mg/dL — ABNORMAL HIGH (ref 70–99)
Glucose-Capillary: 266 mg/dL — ABNORMAL HIGH (ref 70–99)

## 2018-10-25 MED ORDER — PREDNISONE 10 MG PO TABS: 30.0000 mg | ORAL_TABLET | Freq: Every day | ORAL | 0 refills | Status: DC

## 2018-10-25 MED ORDER — CEFDINIR 300 MG PO CAPS: 300.0000 mg | ORAL_CAPSULE | Freq: Two times a day (BID) | ORAL | 0 refills | Status: AC

## 2018-10-25 NOTE — Progress Notes (Addendum)
Family Medicine Teaching Service Daily Progress Note Intern Pager: [phone]  Patient name: Makayla Vasquez Medical record number: [id number] Date of birth: [birth year] Age: 38 y.o. Gender: female  Primary Care Provider: Wendee Beavers, DO Consultants: None Code Status: Full  Pt Overview and Major Events to Date:  1/5 Admitted to FPTS  Assessment and Plan: Makayla Vasquez is a 38 y.o. female presenting with shortness of breath . PMH is significant for SLE, anti-synthetase syndrome, myositis,anxiety, rheumatoid arthritis, asthma, CHF(EF 25-30% in 2015 with pacemaker insertion in 1997),IDDM, interstitial lung disease, and hypertension  SOB Likely secondary to CAP versus viral URI.  Patient remains on her home 4 L of oxygen per nasal cannula.  Lung exam this morning stable from yesterday, clear with good air movement, occasional rhonchi that clears with cough.  Has remained afebrile with WBC 5.3.  Patient was transitioned to oral cefdinir yesterday following receiving 1 dose of ceftriaxone in the ED.  She is overall very well-appearing and has no complaints.  Blood culture no growth greater than 24 hours. -continue supplemental O2, goal SpO2>90% -Follow-up blood culture -S/p ceftriaxone x1 - also received doxy q12 yesterday and 1 dose today, but no need to continue while on omnicef - will stress dose pred to 30mg  x 5 days (1/5- ) -Continue cefdinir p.o. (1/5-) for 7 day total course -continuous pulse ox -d/c IV fluids given stable BP and tolerating PO -zofran prn nausea -robitussin DM prn for cough  Right Wrist pain Rheumo consulted in ED, x-ray negative.  Pain this AM is minimal. 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 last echo 06/2018 with EF 55%, normal systolic function, G1DD. On metoprolol succinate 25 mg daily.  -strict I/Os -monitor fluids status given fluid  resuscitation in ED, may need additional diuretics -continue home metoprolol as tartrate 12.5 BID  SLE/anti-synthetase syndrome- patient follows at [facility], on tacrolimus and cellcept. ED provider had spoken with her rheumatologist who recommended holding medications in setting of infection. -hold tacro and cellcept due to pneumonia, will restart on d/c -stress dose pred to 30mg  QD x 5 days then return to home pred 10mg  QD  Interstitial lung disease with chronic respiratory failure- follows with pulmonology, on 4L O2 at home.  -continue home oxygen  T2DM- chronic, stable. Patient on metformin 1000 mg BID, novolog 8 units w/ meals and lantus 15u daily at home. Last A1C 9/27 6.6.  CBG this AM 104. -CBGs AC/HS, moderate SSI  Anemia- chronic, stable, baseline appears to be 9-10, was 7.9 yesterday, now 8.4. -monitor CBC  GERD- on PPI daily -protonix daily  Constipation- chronic -miralax daily  Protein calorie malnutrition- albumin 3.1 on admit -nutrition to see  FEN/GI: Heart healthy carb modified PPx: Lovenox  Disposition: Likely discharge today home  Subjective:  States that she is feeling well this morning.  States that her breathing is stable.  Has no complaints.  Only notes minimal pain in her right wrist, that is not bothering her.  States that she would like to go home later today.  Objective: Temp:  [97.4 F (36.3 C)-97.8 F (36.6 C)] 97.8 F (36.6 C) (01/05 2045) Pulse Rate:  [75-95] 95 (01/05 2045) Resp:  [16-20] 20 (01/05 2045) BP: (88-124)/(63-84) 124/84 (01/05 2045) SpO2:  [98 %-100 %] 98 % (01/05 2045)  Physical Exam: General: 38 y.o. female in NAD Cardio: RRR no m/r/g Lungs: No wheezing,  no crackles, transient rhonchi that resolved with cough, no increased work of breathing, good air movement throughout, on 4 L O2 per [place] Abdomen: Soft, non-tender to palpation, positive bowel sounds Skin: warm and dry Extremities: No  edema   Laboratory: Recent Labs  Lab 10/24/18 0131 10/24/18 0734 10/25/18 0534  WBC 9.2 6.6 5.3  HGB 9.7* 7.9* 8.4*  HCT 35.3* 27.7* 28.5*  PLT 294 274 291   Recent Labs  Lab 10/24/18 0131 10/24/18 0734 10/25/18 0534  NA 137 138 PENDING  K 3.6 3.6 PENDING  CL 104 102 PENDING  CO2 23 28 PENDING  BUN 10 9 12   CREATININE 0.73 0.72 0.64  CALCIUM 8.4* 8.0* PENDING  PROT 7.0  --   --   BILITOT 0.4  --   --   ALKPHOS 57  --   --   ALT 11  --   --   AST 19  --   --   GLUCOSE 135* 148* 117*     Imaging/Diagnostic Tests: No results found.  Meccariello, Solmon Ice, DO 10/25/2018, 7:47 AM PGY-1, [facility] FPTS Intern pager: [phone], text pages welcome

## 2018-10-25 NOTE — Care Management Obs Status (Signed)
MEDICARE OBSERVATION STATUS NOTIFICATION   Patient Details  Name: Makayla Vasquez MRN: [id number] Date of Birth: 1981-02-28   Medicare Observation Status Notification Given:  Yes    Colleen Can RN, BSN, NCM-BC, ACM-RN [phone] 10/25/2018, 1:52 PM

## 2018-10-25 NOTE — Discharge Instructions (Signed)
You were admitted to the hospital because you have pneumonia and a viral infection.  You were treated with antibiotics and should continue to take the antibiotics for 5 more days.  You should also continue to take the higher dose of prednisone for the next three days, and then return to your home dose of prednisone 10mg  daily.  Should your breathing worsen, you start to have fevers, or you have chest pain, please call your doctor.

## 2018-10-26 ENCOUNTER — Other Ambulatory Visit (HOSPITAL_COMMUNITY): Payer: Self-pay | Admitting: Student in an Organized Health Care Education/Training Program

## 2018-10-29 ENCOUNTER — Encounter: Payer: Self-pay | Admitting: Family Medicine

## 2018-10-29 ENCOUNTER — Ambulatory Visit (INDEPENDENT_AMBULATORY_CARE_PROVIDER_SITE_OTHER): Payer: Medicare Other | Admitting: Family Medicine

## 2018-10-29 VITALS — BP 99/65 | HR 107 | Temp 98.1°F | Wt 142.4 lb

## 2018-10-29 DIAGNOSIS — J189 Pneumonia, unspecified organism: Secondary | ICD-10-CM

## 2018-10-29 DIAGNOSIS — Z09 Encounter for follow-up examination after completed treatment for conditions other than malignant neoplasm: Secondary | ICD-10-CM | POA: Diagnosis not present

## 2018-10-29 DIAGNOSIS — J181 Lobar pneumonia, unspecified organism: Secondary | ICD-10-CM | POA: Diagnosis not present

## 2018-10-29 DIAGNOSIS — R0981 Nasal congestion: Secondary | ICD-10-CM | POA: Insufficient documentation

## 2018-10-29 LAB — CULTURE, BLOOD (ROUTINE X 2)
CULTURE: NO GROWTH
Culture: NO GROWTH

## 2018-10-29 NOTE — Assessment & Plan Note (Signed)
Patient requesting allergy referral.  She is apparently seen allergy in the past but was unable to continue to go because she had Medicaid.  Per request replaced referral for allergy.

## 2018-10-29 NOTE — Patient Instructions (Signed)
It was great meeting you today!  I am sorry had such a hard time with your pneumonia in the hospital.  Your O2 saturation is looking really good and your exam does not have any findings consistent with pneumonia.  Continue to finish your course of antibiotics and steroids.  I will place a referral to an allergist for you to continue see them for your chronic congestion.  As we discussed your cough will probably last for 2-3 more weeks.  If it does not resolve by time.  You can come back and see [place] and we can reevaluate.

## 2018-10-29 NOTE — Assessment & Plan Note (Signed)
Patient doing well and recovering well from CAP.  To complete her course of cefdinir and steroids.  No wheezes or consolidations noted on exam.

## 2018-10-29 NOTE — Progress Notes (Signed)
   HPI 38 year old female who presents hospital follow-up.  Patient was bitten on 10/24/2018 due to shortness of breath.  Her chest x-ray was suggestive of a mild superimposed right midlung and left midlung pneumonia.  Patient initially started on ceftriaxone and azithromycin.  She was transitioned to cefdinir.  Patient also on steroids in the hospital.  She has completed 5-day course of this.  Patient is on last day of steroid course and has 2 more days of cefdinir.  Patient states that she is doing well.  She is on her usual home oxygen.  She does still have a cough.  No other symptoms at this time.  CC: Hospital follow-up  ROS:   Review of Systems See HPI for ROS.   CC, SH/smoking status, and VS noted  Objective: BP 99/65   Pulse (!) 107   Temp 98.1 F (36.7 C) (Oral)   Wt 64.6 kg   SpO2 99%   BMI 23.70 kg/m  Gen: Pleasant African-American female, resting comfortably on 4 L of oxygen, no distress CV: RRR, no murmur Resp: Lungs clear to auscultation bilaterally, no wheezes, no accessory muscle use, nonlabored Abd: SNTND, BS present, no guarding or organomegaly Ext: No edema, warm Neuro: Alert and oriented, Speech clear, No gross deficits   Assessment and plan:  CAP (community acquired pneumonia) Patient doing well and recovering well from CAP.  To complete her course of cefdinir and steroids.  No wheezes or consolidations noted on exam.  Chronic nasal congestion Patient requesting allergy referral.  She is apparently seen allergy in the past but was unable to continue to go because she had Medicaid.  Per request replaced referral for allergy.   Orders Placed This Encounter  Procedures  . Ambulatory referral to Allergy    Referral Priority:   Routine    Referral Type:   Allergy Testing    Referral Reason:   Specialty Services Required    Requested Specialty:   Allergy    Number of Visits Requested:   1    No orders of the defined types were placed in this  encounter.    Myrene Buddy MD PGY-2 Family Medicine Resident  10/29/2018 4:07 PM

## 2018-11-02 ENCOUNTER — Other Ambulatory Visit: Payer: Self-pay | Admitting: Family Medicine

## 2018-11-05 LAB — CUP PACEART REMOTE DEVICE CHECK
Battery Remaining Longevity: 16 mo
Battery Voltage: 2.89 V
Brady Statistic AP VS Percent: 0.01 %
Brady Statistic AS VP Percent: 95.29 %
Brady Statistic AS VS Percent: 1.85 %
Brady Statistic RA Percent Paced: 2.84 %
Brady Statistic RV Percent Paced: 96.86 %
Date Time Interrogation Session: 20191122020127
Implantable Lead Implant Date: 19971017
Implantable Lead Implant Date: 19971017
Implantable Lead Implant Date: 20130520
Implantable Lead Location: 753858
Implantable Lead Location: 753859
Implantable Lead Location: 753860
Implantable Lead Model: 4196
Implantable Pulse Generator Implant Date: 20130520
Lead Channel Impedance Value: 228 Ohm
Lead Channel Impedance Value: 247 Ohm
Lead Channel Impedance Value: 266 Ohm
Lead Channel Impedance Value: 304 Ohm
Lead Channel Impedance Value: 399 Ohm
Lead Channel Impedance Value: 437 Ohm
Lead Channel Impedance Value: 494 Ohm
Lead Channel Impedance Value: 722 Ohm
Lead Channel Pacing Threshold Amplitude: 0.5 V
Lead Channel Pacing Threshold Amplitude: 0.875 V
Lead Channel Pacing Threshold Amplitude: 1.375 V
Lead Channel Pacing Threshold Pulse Width: 0.4 ms
Lead Channel Pacing Threshold Pulse Width: 0.4 ms
Lead Channel Pacing Threshold Pulse Width: 0.4 ms
Lead Channel Sensing Intrinsic Amplitude: 5.25 mV
Lead Channel Sensing Intrinsic Amplitude: 5.25 mV
Lead Channel Sensing Intrinsic Amplitude: 6.875 mV
Lead Channel Sensing Intrinsic Amplitude: 6.875 mV
Lead Channel Setting Pacing Amplitude: 2 V
Lead Channel Setting Pacing Amplitude: 2 V
Lead Channel Setting Pacing Amplitude: 2 V
Lead Channel Setting Pacing Pulse Width: 0.4 ms
Lead Channel Setting Sensing Sensitivity: 0.9 mV
MDC IDC MSMT LEADCHNL LV IMPEDANCE VALUE: 437 Ohm
MDC IDC SET LEADCHNL LV PACING PULSEWIDTH: 0.4 ms
MDC IDC STAT BRADY AP VP PERCENT: 2.85 %

## 2018-11-12 DIAGNOSIS — Z794 Long term (current) use of insulin: Secondary | ICD-10-CM | POA: Diagnosis not present

## 2018-11-12 DIAGNOSIS — H25043 Posterior subcapsular polar age-related cataract, bilateral: Secondary | ICD-10-CM | POA: Diagnosis not present

## 2018-11-12 DIAGNOSIS — E119 Type 2 diabetes mellitus without complications: Secondary | ICD-10-CM | POA: Diagnosis not present

## 2018-11-12 DIAGNOSIS — H2513 Age-related nuclear cataract, bilateral: Secondary | ICD-10-CM | POA: Diagnosis not present

## 2018-11-12 DIAGNOSIS — Z7984 Long term (current) use of oral hypoglycemic drugs: Secondary | ICD-10-CM | POA: Diagnosis not present

## 2018-11-12 LAB — HM DIABETES EYE EXAM

## 2018-11-23 ENCOUNTER — Ambulatory Visit (INDEPENDENT_AMBULATORY_CARE_PROVIDER_SITE_OTHER): Payer: Medicare HMO | Admitting: Family Medicine

## 2018-11-23 ENCOUNTER — Other Ambulatory Visit: Payer: Self-pay

## 2018-11-23 VITALS — BP 124/70 | HR 112 | Temp 98.2°F | Wt 136.0 lb

## 2018-11-23 DIAGNOSIS — R509 Fever, unspecified: Secondary | ICD-10-CM

## 2018-11-23 LAB — POCT INFLUENZA A/B
INFLUENZA A, POC: NEGATIVE
Influenza B, POC: NEGATIVE

## 2018-11-23 MED ORDER — AMOXICILLIN-POT CLAVULANATE 875-125 MG PO TABS: 1.0000 | ORAL_TABLET | Freq: Two times a day (BID) | ORAL | 0 refills | Status: AC

## 2018-11-23 MED ORDER — BENZONATATE 100 MG PO CAPS: 100.0000 mg | ORAL_CAPSULE | Freq: Two times a day (BID) | ORAL | 0 refills | Status: DC | PRN

## 2018-11-23 MED ORDER — ALBUTEROL SULFATE HFA 108 (90 BASE) MCG/ACT IN AERS: 2.0000 | INHALATION_SPRAY | RESPIRATORY_TRACT | 0 refills | Status: DC | PRN

## 2018-11-23 NOTE — Patient Instructions (Signed)
It was great to meet you today! Thank you for letting me participate in your care!  Today, we discussed your current cough, congestion, and fever. I will call you with results of the flu swab. I will also give you a prescription for an antibiotic but I want you to not fill it yet. If you are still feeling poorly (worsening breathing, cough, continued fever) by Friday then please start the antibiotic. I have sent in Tessalon Perles to help with cough and an albuterol to help with breathing and cough as well.   Be well, Jules Schick, DO PGY-2, [facility]

## 2018-11-23 NOTE — Progress Notes (Signed)
Subjective: Chief Complaint  Patient presents with  . Cough    HPI: Makayla Vasquez is a 38 y.o. presenting to clinic today to discuss the following:  Cough Patient with known interstitial lung disease and smoker presents today for a productive cough with greenish phlegm that is thick, subjective fever, congestion, decreased appetite for 1 day. She states she has felt like this before and she had PNA last time. She is not SOB, is not waking up in the night SOB, is still walking around and not requiring more O2 when doing so and is not requiring more O2 at night. She is on 4L at home normally. She states she is still able to eat and drink but her appetite is down. She denies nausea, vomiting, abdominal pain, SOB, difficulty breathing, diarrhea, or constipation.  ROS noted in HPI.   Past Medical, Surgical, Social, and Family History Reviewed & Updated per EMR.   Pertinent Historical Findings include:   Social History   Tobacco Use  Smoking Status Former Smoker  . Packs/day: 0.25  . Years: 0.50  . Pack years: 0.12  . Types: Cigarettes  . Last attempt to quit: 07/25/1996  . Years since quitting: 22.3  Smokeless Tobacco Never Used    Objective: BP 124/70   Pulse (!) 112   Temp 98.2 F (36.8 C) (Oral)   Wt 136 lb (61.7 kg)   SpO2 97%   BMI 22.63 kg/m  Vitals and nursing notes reviewed  Physical Exam Gen: Alert and Oriented x 3, NAD HEENT: Normocephalic, atraumatic, PERRLA, EOMI, TM visible with good light reflex, swollen, erythematous turbinates, non-erythematous pharyngeal mucosa, no exudates Neck: supple, no LAD CV: RRR, no murmurs, normal S1, S2 split Resp: NWOB on 4L O2NC, diffuse mild wheezing with crackles bilaterally in all lung fields Ext: no clubbing, cyanosis, or edema Skin: warm, dry, intact, no rashes  Results for orders placed or performed in visit on 11/23/18 (from the past 72 hour(s))  Influenza A/B     Status: None   Collection Time: 11/23/18 10:35  AM  Result Value Ref Range   Influenza A, POC Negative Negative   Influenza B, POC Negative Negative    Assessment/Plan:  Fever Etiology considered includes Acute exacerbation of Interstitial lung disease, Acute on chronic CHF exacerbation, PNA. CHF unlikely as no SOB and no signs of volume overload. PNA less likely as she has no focal findings on exam, fever responding to Tylenol, and no new oxygen requirement.  Most likely acute exacerbation of her interstitial lung disease caused by upper respiratory virus given symptoms. Flu POCT was negative. - Supportive care with Tylenol and/or Motrin - Tessalon perles at night to help with cough to improve sleep - Robitussin DM to help clear lungs - Augmentin to cover her given her comorbidities and history of PNA.  If her condition does not improve and she returns to clinic I would order a CXR.   PATIENT EDUCATION PROVIDED: See AVS    Diagnosis and plan along with any newly prescribed medication(s) were discussed in detail with this patient today. The patient verbalized understanding and agreed with the plan. Patient advised if symptoms worsen return to clinic or ER.   Health Maintainance:   Orders Placed This Encounter  Procedures  . Influenza A/B    Meds ordered this encounter  Medications  . benzonatate (TESSALON) 100 MG capsule    Sig: Take 1 capsule (100 mg total) by mouth 2 (two) times daily as needed  for cough.    Dispense:  20 capsule    Refill:  0  . albuterol (PROVENTIL HFA;VENTOLIN HFA) 108 (90 Base) MCG/ACT inhaler    Sig: Inhale 2 puffs into the lungs every 4 (four) hours as needed for wheezing or shortness of breath.    Dispense:  1 Inhaler    Refill:  0  . amoxicillin-clavulanate (AUGMENTIN) 875-125 MG tablet    Sig: Take 1 tablet by mouth 2 (two) times daily for 10 days.    Dispense:  20 tablet    Refill:  0     Jules Schick, DO 11/23/2018, 10:19 AM PGY-2 [facility]

## 2018-11-25 IMAGING — DX DG CHEST 2V
4 series · 4 of 4 positions shown · non-contrast
Comparison: Chest radiograph performed 09/23/2016

CLINICAL DATA: Acute onset of fever, hypoxia and shortness of
breath. Assess loss productive cough. Initial encounter.

EXAM:
CHEST  2 VIEW

[chest lat (1 of 2)]
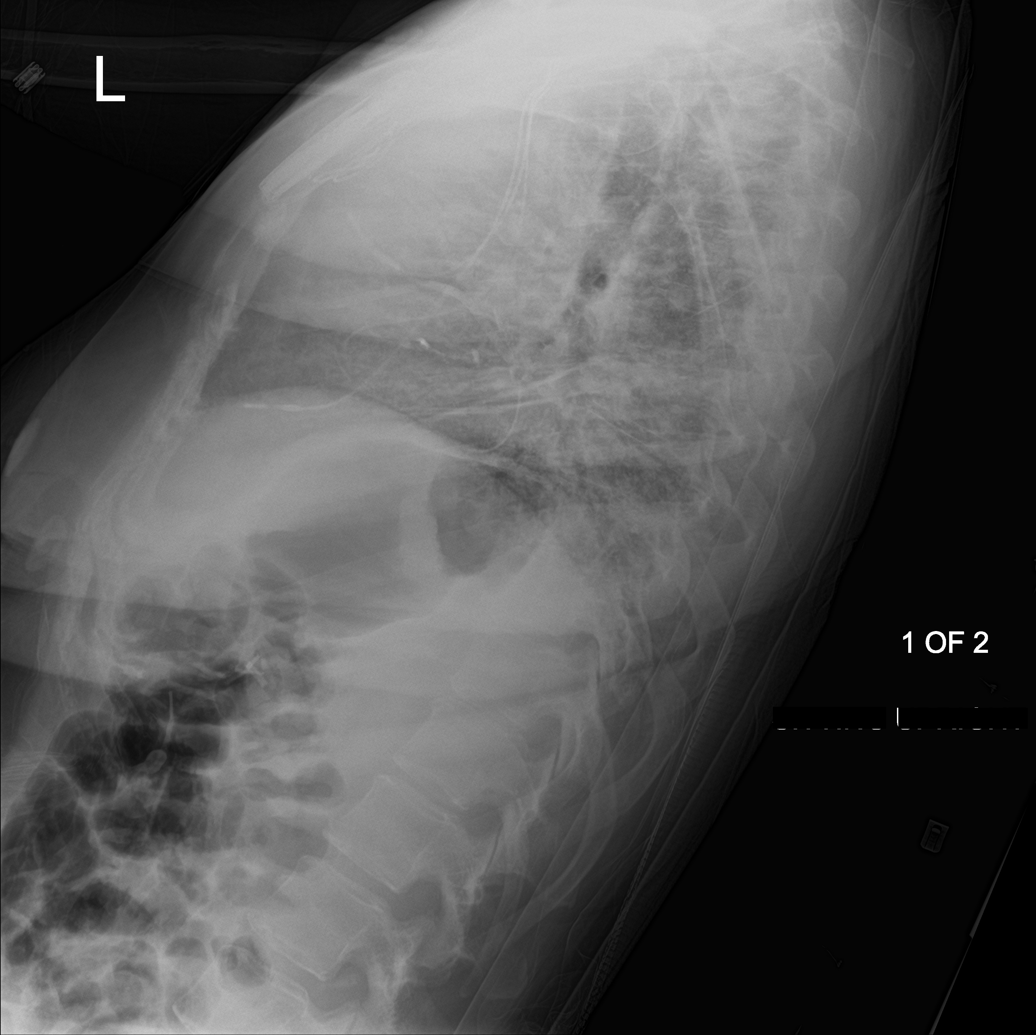

[chest ap (1 of 2)]
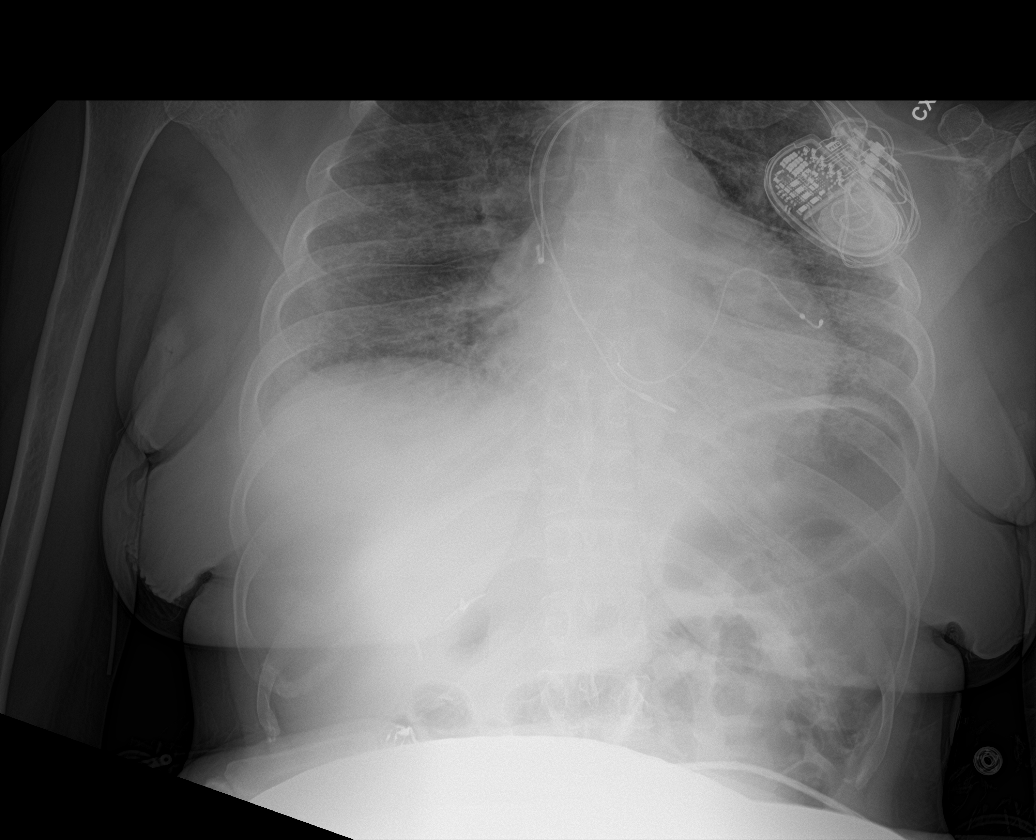

[chest lat (2 of 2)]
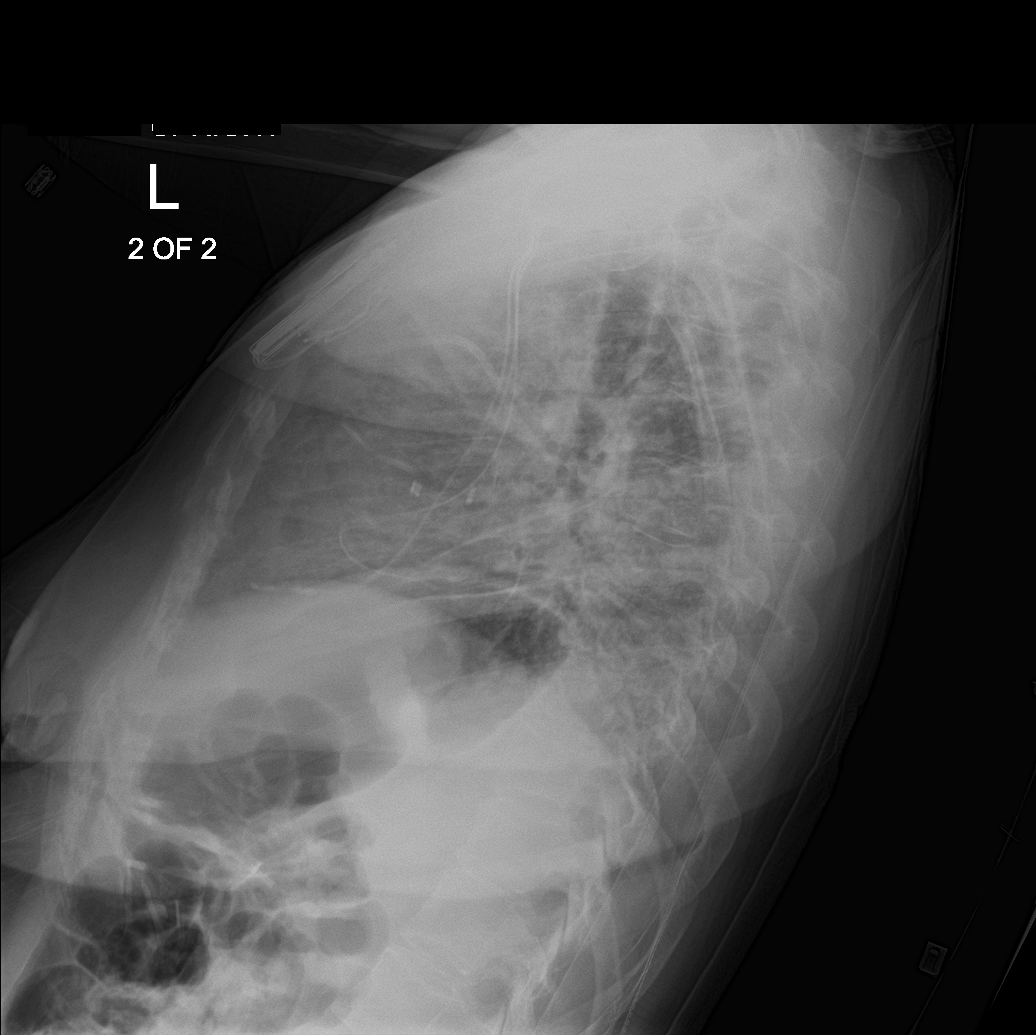

[chest ap (2 of 2)]
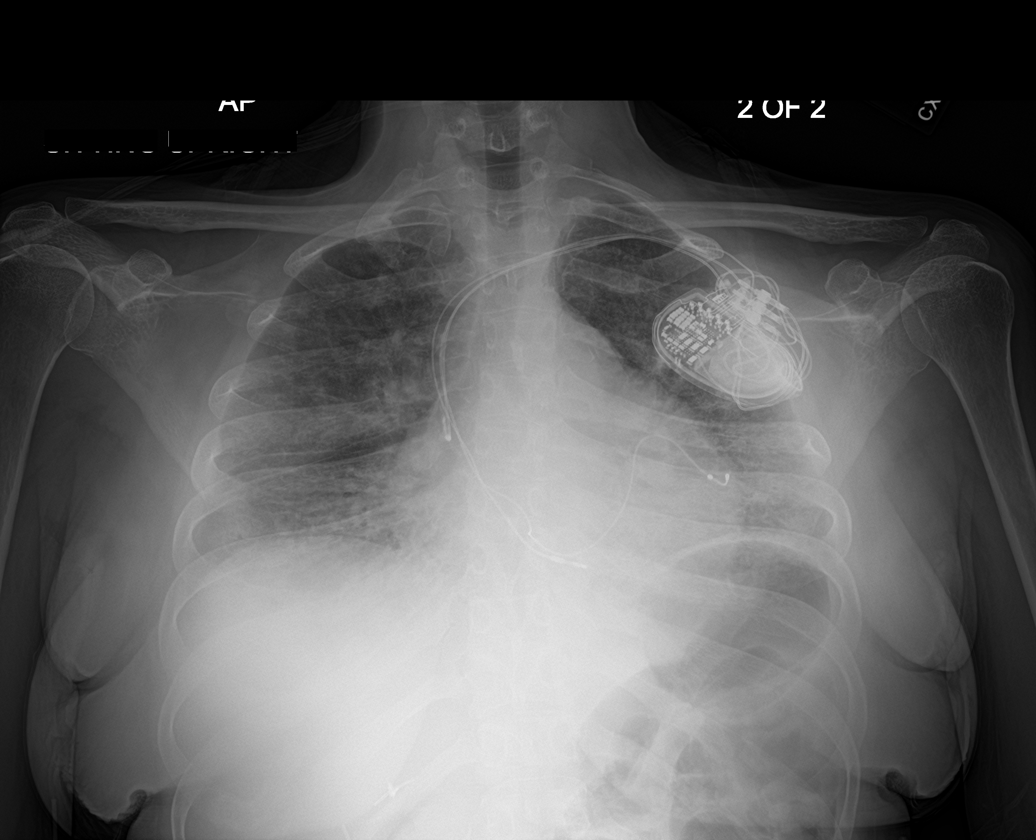

[4 of 4 positions shown; findings below may reference images not displayed]

FINDINGS: The lungs are well-aerated. Vascular congestion is noted. Increased
interstitial markings may reflect mild interstitial edema. Given the
patient's symptoms, pneumonia could have a similar appearance. There
is no evidence of pleural effusion or pneumothorax.

The heart is borderline normal in size. A pacemaker is noted
overlying the left chest wall, with leads ending overlying the right
atrium, right ventricle and coronary sinus. No acute osseous
abnormalities are seen. Clips are noted within the right upper
quadrant, reflecting prior cholecystectomy.
IMPRESSION: Vascular congestion noted. Increased interstitial markings may
reflect mild interstitial edema. Given the patient's symptoms,
pneumonia could have a similar appearance.

## 2018-11-26 NOTE — Assessment & Plan Note (Signed)
Etiology considered includes Acute exacerbation of Interstitial lung disease, Acute on chronic CHF exacerbation, PNA. CHF unlikely as no SOB and no signs of volume overload. PNA less likely as she has no focal findings on exam, fever responding to Tylenol, and no new oxygen requirement.  Most likely acute exacerbation of her interstitial lung disease caused by upper respiratory virus given symptoms. Flu POCT was negative. - Supportive care with Tylenol and/or Motrin - Tessalon perles at night to help with cough to improve sleep - Robitussin DM to help clear lungs - Augmentin to cover her given her comorbidities and history of PNA.  If her condition does not improve and she returns to clinic I would order a CXR.

## 2018-11-30 ENCOUNTER — Other Ambulatory Visit: Payer: Self-pay

## 2018-11-30 ENCOUNTER — Ambulatory Visit (HOSPITAL_COMMUNITY)
Admission: RE | Admit: 2018-11-30 | Discharge: 2018-11-30 | Disposition: A | Discharge status: Home / Self Care | Payer: Medicare HMO | Source: Ambulatory Visit | Attending: Cardiology | Admitting: Cardiology

## 2018-11-30 ENCOUNTER — Ambulatory Visit (HOSPITAL_BASED_OUTPATIENT_CLINIC_OR_DEPARTMENT_OTHER)
Admission: RE | Admit: 2018-11-30 | Discharge: 2018-11-30 | Disposition: A | Discharge status: Home / Self Care | Payer: Medicare HMO | Source: Ambulatory Visit | Attending: Cardiology | Admitting: Cardiology

## 2018-11-30 VITALS — BP 108/82 | HR 105 | Wt 141.0 lb

## 2018-11-30 DIAGNOSIS — I428 Other cardiomyopathies: Secondary | ICD-10-CM | POA: Diagnosis not present

## 2018-11-30 DIAGNOSIS — J849 Interstitial pulmonary disease, unspecified: Secondary | ICD-10-CM | POA: Insufficient documentation

## 2018-11-30 DIAGNOSIS — Z792 Long term (current) use of antibiotics: Secondary | ICD-10-CM | POA: Diagnosis not present

## 2018-11-30 DIAGNOSIS — I5022 Chronic systolic (congestive) heart failure: Secondary | ICD-10-CM

## 2018-11-30 DIAGNOSIS — K219 Gastro-esophageal reflux disease without esophagitis: Secondary | ICD-10-CM | POA: Insufficient documentation

## 2018-11-30 DIAGNOSIS — Q246 Congenital heart block: Secondary | ICD-10-CM | POA: Insufficient documentation

## 2018-11-30 DIAGNOSIS — M609 Myositis, unspecified: Secondary | ICD-10-CM | POA: Insufficient documentation

## 2018-11-30 DIAGNOSIS — I110 Hypertensive heart disease with heart failure: Secondary | ICD-10-CM | POA: Diagnosis present

## 2018-11-30 DIAGNOSIS — Z8249 Family history of ischemic heart disease and other diseases of the circulatory system: Secondary | ICD-10-CM | POA: Insufficient documentation

## 2018-11-30 DIAGNOSIS — Z794 Long term (current) use of insulin: Secondary | ICD-10-CM | POA: Insufficient documentation

## 2018-11-30 DIAGNOSIS — I420 Dilated cardiomyopathy: Secondary | ICD-10-CM | POA: Diagnosis not present

## 2018-11-30 DIAGNOSIS — Z950 Presence of cardiac pacemaker: Secondary | ICD-10-CM | POA: Insufficient documentation

## 2018-11-30 DIAGNOSIS — I340 Nonrheumatic mitral (valve) insufficiency: Secondary | ICD-10-CM | POA: Diagnosis not present

## 2018-11-30 DIAGNOSIS — M069 Rheumatoid arthritis, unspecified: Secondary | ICD-10-CM | POA: Diagnosis not present

## 2018-11-30 DIAGNOSIS — Z87891 Personal history of nicotine dependence: Secondary | ICD-10-CM | POA: Diagnosis not present

## 2018-11-30 DIAGNOSIS — Z7952 Long term (current) use of systemic steroids: Secondary | ICD-10-CM | POA: Insufficient documentation

## 2018-11-30 DIAGNOSIS — Z79899 Other long term (current) drug therapy: Secondary | ICD-10-CM | POA: Insufficient documentation

## 2018-11-30 DIAGNOSIS — Z888 Allergy status to other drugs, medicaments and biological substances status: Secondary | ICD-10-CM | POA: Insufficient documentation

## 2018-11-30 DIAGNOSIS — E119 Type 2 diabetes mellitus without complications: Secondary | ICD-10-CM | POA: Insufficient documentation

## 2018-11-30 DIAGNOSIS — I272 Pulmonary hypertension, unspecified: Secondary | ICD-10-CM | POA: Insufficient documentation

## 2018-11-30 DIAGNOSIS — Z882 Allergy status to sulfonamides status: Secondary | ICD-10-CM | POA: Insufficient documentation

## 2018-11-30 LAB — ECHOCARDIOGRAM COMPLETE: Weight: 2256 oz

## 2018-11-30 MED ORDER — BISOPROLOL FUMARATE 5 MG PO TABS: 2.5000 mg | ORAL_TABLET | Freq: Every day | ORAL | Status: DC

## 2018-11-30 NOTE — Patient Instructions (Addendum)
BEGIN taking Bisoprolol 2.5 mg (0.5 tab) every night.  Follow up in 2 weeks in the Pharmacy clinic for medication titration.   Your physician recommends that you schedule a follow-up appointment in: 2 MONTHS

## 2018-11-30 NOTE — Progress Notes (Signed)
Patient ID: Makayla Vasquez, female   DOB: 1980/11/15, 38 y.o.   MRN: [id number] PCP: Wendee Beavers, DO Cardiology: Dr Shirlee Latch  HPI: Makayla Vasquez is 38 y.o. 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   She has a h/o congenital HB and underwent first PPM at age 20. In 2011 had ECHO with EF 35-40%. LV dysfunction felt to be due to RV pacing so upgraded to CRT-P. Echo in 10/15 with EF 25%.  She also has concomitant lung disease and this has limited b-blocker dosing.  She follows with Dr. Maple Hudson for her lung disease. She is a former smoker.  Admitted in 8/15 and 9/15 with worsening respiratory status and severe hypoxia. Had extensive w/u. Thought to have a viral pneumonitis complicated by atelectasis and mild edema vs. an autoimmune process. Discharged home on a course of levaquin and prednisone.   She was offered a lung biopsy for further diagnosis versus empiric treatment with steroids. She opted for empiric steroid treatment. She is followed by a rheumatologist and is on Cellcept, prednisone, and tacrolimus.  She is on oxygen and night and with activity.   Admission in 5/17 for ?sepsis and management of interstitial lung disease.     She was admitted with PNA in 1/18.  Lasix, losartan, and bisoprolol stopped.   Echo in 1/19 showed EF 45-50%, with mild to moderately decreased RV systolic function and moderate MR.  Echo was done today and reviewed, EF 35-40% with normal RV size and mildly decreased RV systolic function.   She returns for followup of CHF and ILD. She has stable dyspnea with moderate exertion, notes it doing chores around the house.  She wears oxygen at all times. No orthopnea/PND. She has stopped her cardiac meds in the past because of low BP. It appears that her BP will get down into the 90s but she does not get lightheaded.    Medtronic device interrogation: Fluid  index < threshold with stable thoracic impedance.  She had an episode of about 50 minutes of possible atrial fibrillation several weeks ago.     PFTs (12/15) showed a severe restrictive defect concerning for an interstitial process.   EP study 10/15 for persistent tachycardia and found to have sinus tachycardia.   06/16/14 CT scan showed bilateral progressive lower peribronchial infiltrates with groundglass changes and nodular patter consistent with acute pneumonitis of unclear etiology.  06/20/14 BAL showed 60% macrophages and 30% PMNs. Her AFB smear, fungal smear, legionella Ab, pneumocystis smear, ACE, and sputum culture are negative. Her AFB, legionella, fungal, and BAL culture as well as her fungal Ab, hypersensitivity pneumonitis panel were all negative Serology: DsDNA, RF, ANCA, HSP all negative.  Anti-Jo-1 Ab positive.   RHC 10/26/2014  RA = 2 RV = 27/1/5 PA = 29/7 (18) PCW = 4 Fick cardiac output/index = 4.5/2.2 PVR = 3.0 WU FA sat = 98% PA sat = 64%, 61%  Labs (10/15): TSH normal K 3.6, creatinine 1.1 Labs (10/31/2014) L k 3.9 Creatinine 1.06  Labs 07/23/2015: K 3.8 Creatinine 0.82  Labs (3/17): K 4, creatinine 0.6 Labs (5/17): K 4.1, creatinine 0.7 Labs (1/18): K 3.6, creatinine 0.42 Labs (2/19): K 3.5, creatinine 0.77 Labs (6/19): K 3.8c, creatinine 0.77 Labs (10/19): hgb 7.5, CK 1254, K 3.7, creatinine 0.55 Labs (1/20): K 3.6, creatinine 0.72  Review of Systems: All systems reviewed and negative except as per HPI.  Past Medical History:  Diagnosis Date  . Anxiety   . Arthritis    rheumatoid arthritis- mild, no rheumatology care   . Asthma    seasonal allergies   . Asymptomatic LV dysfunction    a. Echo in Dec 2011 with EF 35 to 40%. Felt to be due to paced rhythm. b. EF 25-30% in 07/2014.  . Bipolar affective disorder (HCC)   . Cardiac pacemaker    a. Since age 38 in [id number]. b. Upgrade to BiV in 2013.  Makayla Vasquez. Carpal tunnel syndrome of right wrist   . CHF (congestive  heart failure) (HCC)   . Congenital complete AV block   . Cor pulmonale, chronic (HCC) 04/08/2018  . Depression    bipolar  . Diabetes mellitus without complication (HCC)   . GERD (gastroesophageal reflux disease)   . Hypertension   . Interstitial lung disease (HCC)   . Lupus (HCC)   . Lupus (systemic lupus erythematosus) (HCC)   . Obesity   . Pneumonitis    a. a/w hypoxia - inflammatory - large workup 07/2014.  . Presence of permanent cardiac pacemaker   . Seizures (HCC)    as a child- from high fever  . Sinus tachycardia     Current Outpatient Medications  Medication Sig Dispense Refill  . albuterol (PROVENTIL HFA;VENTOLIN HFA) 108 (90 Base) MCG/ACT inhaler Inhale 2 puffs into the lungs every 4 (four) hours as needed for wheezing or shortness of breath. 1 Inhaler 0  . amoxicillin-clavulanate (AUGMENTIN) 875-125 MG tablet Take 1 tablet by mouth 2 (two) times daily for 10 days. 20 tablet 0  . benzonatate (TESSALON) 100 MG capsule Take 1 capsule (100 mg total) by mouth 2 (two) times daily as needed for cough. 20 capsule 0  . dextromethorphan-guaiFENesin (ROBITUSSIN-DM) 10-100 MG/5ML liquid TAKE 5 MLS BY MOUTH EVERY 4 (FOUR) HOURS AS NEEDED FOR COUGH. 118 mL 0  . dicyclomine (BENTYL) 20 MG tablet Take 20 mg by mouth 4 (four) times daily -  before meals and at bedtime.     . Etonogestrel (NEXPLANON [place]) Inject 1 application into the skin once.     . furosemide (LASIX) 40 MG tablet TAKE 1 TABLET BY MOUTH ONCE DAILY 30 tablet 5  . LANTUS SOLOSTAR 100 UNIT/ML Solostar Pen INJECT 15 UNITS SUBCUTANEOUSLY EVERY DAY (Patient taking differently: Inject 15 Units into the skin daily. ) 15 pen 2  . metFORMIN (GLUCOPHAGE) 1000 MG tablet Take 1 tablet (1,000 mg total) by mouth 2 (two) times daily with a meal. 90 tablet 3  . mycophenolate (CELLCEPT) 500 MG tablet Take 1,000 mg by mouth 2 (two) times daily.     Makayla Vasquez. NOVOLOG FLEXPEN 100 UNIT/ML FlexPen INJECT 8 UNITS INTO THE SKIN 3 TIMES DAILY WITH MEALS.  15 pen 4  . omeprazole (PRILOSEC) 20 MG capsule Take 1 capsule (20 mg total) by mouth daily. 30 capsule 4  . OXYGEN Inhale 2-4 L into the lungs.     . potassium chloride SA (K-DUR,KLOR-CON) 20 MEQ tablet Take 1 tablet (20 mEq total) by mouth daily. 30 tablet 11  . predniSONE (DELTASONE) 10 MG tablet Take 10 mg by mouth daily with breakfast.    . tacrolimus (PROGRAF) 0.5 MG capsule Take 1.5 mg by mouth 2 (two) times daily.    . traZODone (DESYREL) 50 MG tablet TAKE 1 TABLET (50 MG TOTAL) BY MOUTH AT BEDTIME AS NEEDED FOR SLEEP. 90 tablet 2   Current Facility-Administered Medications  Medication Dose Route Frequency Provider Last  Rate Last Dose  . bisoprolol (ZEBETA) tablet 2.5 mg  2.5 mg Oral QHS Laurey Morale, MD        Allergies  Allergen Reactions  . 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 Zoloft [Sertraline Hcl] Hives  . Sertraline Hcl Hives  . Tape Other (See Comments)    Burns skin      Social History   Socioeconomic History  . Marital status: Single    Spouse name: Not on file  . Number of children: 1  . Years of education: Not on file  . Highest education level: Not on file  Occupational History  . Occupation: CNA  Social Needs  . Financial resource strain: Not hard at all  . Food insecurity:    Worry: Never true    Inability: Never true  . Transportation needs:    Medical: No    Non-medical: No  Tobacco Use  . Smoking status: Former Smoker    Packs/day: 0.25    Years: 0.50    Pack years: 0.12    Types: Cigarettes    Last attempt to quit: 07/25/1996    Years since quitting: 22.3  . Smokeless tobacco: Never Used  Substance and Sexual Activity  . Alcohol use: No  . Drug use: No  . Sexual activity: Yes    Birth control/protection: Implant  Lifestyle  . Physical activity:    Days per week: 0 days    Minutes per session: 0 min  . Stress: To some extent  Relationships  . Social  connections:    Talks on phone: More than three times a week    Gets together: Twice a week    Attends religious service: 1 to 4 times per year    Active member of club or organization: No    Attends meetings of clubs or organizations: Never    Relationship status: Never married  . Intimate partner violence:    Fear of current or ex partner: No    Emotionally abused: No    Physically abused: No    Forced sexual activity: No  Other Topics Concern  . Not on file  Social History Narrative   Works at [employer].        Family History  Problem Relation Age of Onset  . Heart disease Mother        CHF (no details)  . Hypertension Mother   . Lupus Mother   . Heart disease Father        Murmur  . Heart disease Sister 71        No details.  History of a pacemaker    Vitals:   11/30/18 1051  BP: 108/82  Pulse: (!) 105  SpO2: 94%  Weight: 64 kg (141 lb)    PHYSICAL EXAM: General: NAD Neck: No JVD, no thyromegaly or thyroid nodule.  Lungs: Distant breath sounds.  CV: Nondisplaced PMI.  Heart mildly tachy, regular S1/S2, no S3/S4, no murmur.  No peripheral edema.  No carotid bruit.  Normal pedal pulses.  Abdomen: Soft, nontender, no hepatosplenomegaly, no distention.  Skin: Intact without lesions or rashes.  Neurologic: Alert and oriented x 3.  Psych: Normal affect. Extremities: No clubbing or cyanosis.  HEENT: Normal.   ASSESSMENT & PLAN: 1) Chronic systolic HF:  Echo 05/2014 EF 20-25%, echo 1/19 improved with EF 45-50% and mild-moderately decreased systolic function. Echo was done today and reviewed, EF 35-40% with  mild RV systolic dysfunction.  Nonischemic cardiomyopathy.  Medtronic CRT-P device, BiV pacing > 90% of the time on interrogation today.  She does not look volume overloaded on exam or by Optivol. She is not currently on any cardiac meds due to "low BP."  NYHA class II-III symptoms.  - Continue Lasix 40 mg daily.   - I will start her on bisoprolol 2.5 mg qhs. Would  tolerate SBP >/= 85 as long as she is not symptomatic.  - Followup in 2 wks in pharmacy clinic.  If SBP >/= 85, would start spironolactone 12.5 daily.  She can have a couple of appointments in pharmacy clinic to titrate meds then I will see her back in 2 months.  2) Anti-synthetase syndrome with interstitial lung disease: This is associated with elevated ant-Jo1 Ab, ILD, and myositis.  CK has been persistently elevated.  She is followed by rheumatology and is on Cellcept, tacrolimus, and prednisone.   3) Congenital AVN block s/p pacer: Has EP followup.  4)?Atrial fibrillation: There was an episode of possible atrial fibrillation noted on device, lasted for < 1 hour. There is no evidence for anticoagulating such a low atrial fibrillation burden.  Will continue to monitor.   [name] 11/30/2018

## 2018-11-30 NOTE — Progress Notes (Signed)
  Echocardiogram 2D Echocardiogram has been performed.  Tye Savoy 11/30/2018, 11:02 AM

## 2018-12-01 ENCOUNTER — Encounter: Payer: Self-pay | Admitting: Allergy

## 2018-12-01 ENCOUNTER — Ambulatory Visit (INDEPENDENT_AMBULATORY_CARE_PROVIDER_SITE_OTHER): Payer: Medicare HMO | Admitting: Allergy

## 2018-12-01 VITALS — BP 88/62 | HR 101 | Temp 97.8°F | Resp 16 | Ht 65.0 in | Wt 141.0 lb

## 2018-12-01 DIAGNOSIS — H1013 Acute atopic conjunctivitis, bilateral: Secondary | ICD-10-CM | POA: Diagnosis not present

## 2018-12-01 DIAGNOSIS — J3089 Other allergic rhinitis: Secondary | ICD-10-CM

## 2018-12-01 MED ORDER — AZELASTINE-FLUTICASONE 137-50 MCG/ACT NA SUSP: 2.0000 | NASAL | 5 refills | Status: DC

## 2018-12-01 NOTE — Progress Notes (Signed)
New Patient Note  RE: Makayla Vasquez MRN: [id number] DOB: 12-05-1980 Date of Office Visit: 12/01/2018  Referring provider: Wendee BeaversMcMullen, David J, DO Primary care provider: Wendee BeaversMcMullen, David J, DO  Chief Complaint: nasal congestion  History of present illness: Makayla Vasquez is a 38 y.o. female presenting today for consultation for nasal congestion.  Referral requested by Dr. Zerita BoersMcMullan.  She has a complex medical history including interstitial lung disease on chronic supplemental oxygen, heart disease with dilated cardiomyopathy and congestive heart failure, lupus, diabetes, history of adrenal insufficiency, hyperlipidemia.  She is followed by pulmonary, cardiology, rheumatology.    She presents today as she states she has been having chronic nasal congestion.  In addition to her nasal congestion she reports significant nasal drainage with postnasal drip and throat clearing.  She also reports sneezing, watery eyes as well.  Symptoms are year-round.  She states she used Flonase but it was not effective.  She also used to take an antihistamine but states she was taking off during a hospitalization for PNA about 6 months ago.   She states she was never put back on.  She has had allergy testing done previously and states she did undergo allergen immunotherapy for about 4 months within the past 5-10 years.  She states she stopped as it was hurting her arm.  This was done with Dr. Corinda GublerLeBauer.  She also per review of records had a serum IgE environmental panel done in 2015 that was negative.  She is on chronic O2 therapy 2-4L since 2014 for her pulmonary disease including interstitial lung disease.  She is managed by Dr. Maple HudsonYoung.   She does report that the oxygen does dry her nose quite a bit but she denies using any nasal saline or any other nasal sprays at this time.  She reports history of eczema with moisturization.   She denies a history of food allergy.  Review of systems: Review of Systems    Constitutional: Negative for chills, fever and malaise/fatigue.  HENT: Positive for congestion. Negative for ear discharge, nosebleeds, sinus pain and sore throat.   Eyes: Negative for pain, discharge and redness.  Respiratory: Positive for cough and shortness of breath.   Cardiovascular: Negative for chest pain.  Gastrointestinal: Negative for abdominal pain, constipation, diarrhea, nausea and vomiting.  Skin: Negative for itching and rash.  Neurological: Negative for headaches.    All other systems negative unless noted above in HPI  Past medical history: Past Medical History:  Diagnosis Date  . Anxiety   . Arthritis    rheumatoid arthritis- mild, no rheumatology care   . Asthma    seasonal allergies   . Asymptomatic LV dysfunction    a. Echo in Dec 2011 with EF 35 to 40%. Felt to be due to paced rhythm. b. EF 25-30% in 07/2014.  . Bipolar affective disorder (HCC)   . Cardiac pacemaker    a. Since age 38 in [id number]. b. Upgrade to BiV in 2013.  [name]. Carpal tunnel syndrome of right wrist   . CHF (congestive heart failure) (HCC)   . Congenital complete AV block   . Cor pulmonale, chronic (HCC) 04/08/2018  . Depression    bipolar  . Diabetes mellitus without complication (HCC)   . GERD (gastroesophageal reflux disease)   . Hypertension   . Interstitial lung disease (HCC)   . Lupus (HCC)   . Lupus (systemic lupus erythematosus) (HCC)   . Obesity   . Pneumonitis    a. a/w  hypoxia - inflammatory - large workup 07/2014.  . Presence of permanent cardiac pacemaker   . Seizures (HCC)    as a child- from high fever  . Sinus tachycardia     Past surgical history: Past Surgical History:  Procedure Laterality Date  . ATRIAL TACH ABLATION N/A 08/14/2014   Procedure: ATRIAL TACH ABLATION;  Surgeon: Marinus Maw, MD;  Location[facility] C[facility] LAB;  Service: Cardiovascular;  Laterality: N/A;  . BI-VENTRICULAR PACEMAKER UPGRADE N/A 03/08/2012   Procedure: BI-VENTRICULAR PACEMAKER UPGRADE;   Surgeon: Marinus Maw, MD;  Location: [facility] CATH LAB;  Service: Cardiovascular;  Laterality: N/A;  . CARPAL TUNNEL WITH CUBITAL TUNNEL Right 07/26/2013   Procedure: RIGHT LIMITED OPEN CARPAL TUNNEL RELEASE ,  RIGHT CUBITAL TUNNEL RELEASE, INSITU VERSES ULNAR NERVE DECOMPRESSION AND ANTERIOR TRANSPOSITION;  Surgeon: Dominica Severin, MD;  Location: [facility] OR;  Service: Orthopedics;  Laterality: Right;  . CESAREAN SECTION    . CHOLECYSTECTOMY    . CYST EXCISION  12/10/2012   THYROID  . INSERT / REPLACE / REMOVE PACEMAKER     2001  . IUD REMOVAL  11/03/2011   Procedure: INTRAUTERINE DEVICE (IUD) REMOVAL;  Surgeon: Myra C. Marice Potter, MD;  Location: [facility] ORS;  Service: Gynecology;  Laterality: N/A;  . NASAL FRACTURE SURGERY     /w plate   . RIGHT HEART CATHETERIZATION N/A 10/26/2014   Procedure: RIGHT HEART CATH;  Surgeon: Dolores Patty, MD;  Location: [facility] CATH LAB;  Service: Cardiovascular;  Laterality: N/A;  . THROAT SURGERY  1994   s/p laser treatment  . THYROGLOSSAL DUCT CYST N/A 12/10/2012   Procedure: REVISION OF THYROGLOSSAL DUCT CYST EXCISION;  Surgeon: Serena Colonel, MD;  Location: [facility] OR;  Service: ENT;  Laterality: N/A;  Revision of Thyroglossal Duct Cyst Excision  . VIDEO BRONCHOSCOPY Bilateral 06/19/2014   Procedure: VIDEO BRONCHOSCOPY WITHOUT FLUORO;  Surgeon: Kalman Shan, MD;  Location[facility] ENDOSCOPY;  Service: Cardiopulmonary;  Laterality: Bilateral;    Family history:  Family History  Problem Relation Age of Onset  . Heart disease Mother        CHF (no details)  . Hypertension Mother   . Lupus Mother   . Heart disease Father        Murmur  . Heart disease Sister 83        No details.  History of a pacemaker    Social history: She lives in an apartment with gas and electric heating and central cooling.  There is concern for water damage or mildew in the home.  She works as a Lawyer. Tobacco Use  . Smoking status: Former Smoker    Packs/day: 0.25    Years: 0.50    Pack years: 0.12     Types: Cigarettes    Last attempt to quit: 07/25/1996    Years since quitting: 22.3  . Smokeless tobacco: Never Used    Medication List: Allergies as of 12/01/2018      Reactions   Atorvastatin Other (See Comments)   Possible statin induced myopathy with elevated CK on atrovastatin 40   Sertraline Hcl Hives   Tape Other (See Comments)   Burns skin      Medication List       Accurate as of December 01, 2018  1:51 PM. Always use your most recent med list.        albuterol 108 (90 Base) MCG/ACT inhaler Commonly known as:  PROVENTIL HFA;VENTOLIN HFA Inhale 2 puffs into the lungs every 4 (four)  hours as needed for wheezing or shortness of breath.   amoxicillin-clavulanate 875-125 MG tablet Commonly known as:  AUGMENTIN Take 1 tablet by mouth 2 (two) times daily for 10 days.   Azelastine-Fluticasone 137-50 MCG/ACT Susp Commonly known as:  DYMISTA Place 2 sprays into both nostrils 1 day or 1 dose.   benzonatate 100 MG capsule Commonly known as:  TESSALON Take 1 capsule (100 mg total) by mouth 2 (two) times daily as needed for cough.   bisoprolol 5 MG tablet Commonly known as:  ZEBETA Take 5 mg by mouth daily.   dextromethorphan-guaiFENesin 10-100 MG/5ML liquid Commonly known as:  ROBITUSSIN-DM TAKE 5 MLS BY MOUTH EVERY 4 (FOUR) HOURS AS NEEDED FOR COUGH.   dicyclomine 20 MG tablet Commonly known as:  BENTYL Take 20 mg by mouth 4 (four) times daily -  before meals and at bedtime.   furosemide 40 MG tablet Commonly known as:  LASIX TAKE 1 TABLET BY MOUTH ONCE DAILY   LANTUS SOLOSTAR 100 UNIT/ML Solostar Pen Generic drug:  Insulin Glargine INJECT 15 UNITS SUBCUTANEOUSLY EVERY DAY   metFORMIN 1000 MG tablet Commonly known as:  GLUCOPHAGE Take 1 tablet (1,000 mg total) by mouth 2 (two) times daily with a meal.   metoprolol succinate 100 MG 24 hr tablet Commonly known as:  TOPROL-XL Take by mouth.   mycophenolate 500 MG tablet Commonly known as:  CELLCEPT Take  1,000 mg by mouth 2 (two) times daily.   NEXPLANON [place] Inject 1 application into the skin once.   NOVOLOG FLEXPEN 100 UNIT/ML FlexPen Generic drug:  insulin aspart INJECT 8 UNITS INTO THE SKIN 3 TIMES DAILY WITH MEALS.   omeprazole 20 MG capsule Commonly known as:  PRILOSEC Take 1 capsule (20 mg total) by mouth daily.   OXYGEN Inhale 2-4 L into the lungs.   potassium chloride SA 20 MEQ tablet Commonly known as:  K-DUR,KLOR-CON Take 1 tablet (20 mEq total) by mouth daily.   predniSONE 10 MG tablet Commonly known as:  DELTASONE Take 10 mg by mouth daily with breakfast.   tacrolimus 0.5 MG capsule Commonly known as:  PROGRAF Take 1.5 mg by mouth 2 (two) times daily.   traZODone 50 MG tablet Commonly known as:  DESYREL TAKE 1 TABLET (50 MG TOTAL) BY MOUTH AT BEDTIME AS NEEDED FOR SLEEP.       Known medication allergies: Allergies  Allergen Reactions  . 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 Sertraline Hcl Hives  . Tape Other (See Comments)    Burns skin     Physical examination: Blood pressure (!) 88/62, pulse (!) 101, temperature 97.8 F (36.6 C), temperature source Oral, resp. rate 16, height 5\' 5"  (1.651 m), weight 141 lb (64 kg), SpO2 98 %.  General: Alert, interactive, in no acute distress, wearing nasal cannula. HEENT: PERRLA, TMs pearly gray, turbinates minimally edematous with clear discharge, post-pharynx non erythematous. Neck: Supple without lymphadenopathy. Lungs: Clear to auscultation without wheezing, rhonchi or rales. {no increased work of breathing.   coughing bouts throughout encounter CV: Normal S1, S2 without murmurs. Abdomen: Nondistended, nontender. Skin: Warm and dry, without lesions or rashes. Extremities:  No clubbing, cyanosis or edema. Neuro:   Grossly intact.  Diagnositics/Labs:  Allergy testing: Deferred due to patient's respiratory status  Assessment and  plan:   Chronic rhinitis and conjunctivitis  - will obtain environmental allergy profile today  - trial dymista 1 spray each nostril  twice a day.  This is a combination nasal spray with Flonase + Astelin (nasal antihistamine).  This helps with both nasal congestion and drainage.  Sample provided today.   - use nasal spray with proper technique demonstrated today (point tip of bottle towards eye on same side as nostril)  - try Xyzal 5mg  daily - this is a long-acting antihistamine similar to zyrtec you have used in the past.  Sample of Xyzal provided today  - recommend use of nasal saline spray to help keep the nose moisturized especially with oxygen use  Follow-up 3-4 months or sooner if needed  I appreciate the opportunity to take part in [place] care. Please do not hesitate to contact me with questions.  Sincerely,   Margo Aye, MD Allergy/Immunology [facility]

## 2018-12-01 NOTE — Patient Instructions (Addendum)
Allergies   - will obtain environmental allergy profile today  - trial dymista 1 spray each nostril twice a day.  This is a combination nasal spray with Flonase + Astelin (nasal antihistamine).  This helps with both nasal congestion and drainage.  Sample provided today.   - use nasal spray with proper technique demonstrated today (point tip of bottle towards eye on same side as nostril)  - try Xyzal 5mg  daily - this is a long-acting antihistamine similar to zyrtec you have used in the past.  Sample of Xyzal provided today  - recommend use of nasal saline spray to help keep the nose moisturized especially with oxygen use  Follow-up 3-4 months or sooner if needed

## 2018-12-04 LAB — ALLERGENS W/TOTAL IGE AREA 2
Alternaria Alternata IgE: <0.1 kU/L
Aspergillus Fumigatus IgE: <0.1 kU/L
Bermuda Grass IgE: <0.1 kU/L
Cat Dander IgE: <0.1 kU/L
Cedar, Mountain IgE: <0.1 kU/L
Cladosporium Herbarum IgE: <0.1 kU/L
Cockroach, German IgE: <0.1 kU/L
Cottonwood IgE: <0.1 kU/L
D Farinae IgE: <0.1 kU/L
D Pteronyssinus IgE: <0.1 kU/L
Dog Dander IgE: <0.1 kU/L
Elm, American IgE: <0.1 kU/L
IgE (Immunoglobulin E), Serum: 16 IU/mL (ref 6–495)
Johnson Grass IgE: <0.1 kU/L
Maple/Box Elder IgE: <0.1 kU/L
Mouse Urine IgE: <0.1 kU/L
Oak, White IgE: <0.1 kU/L
Pecan, Hickory IgE: <0.1 kU/L
Penicillium Chrysogen IgE: <0.1 kU/L
Pigweed, Rough IgE: <0.1 kU/L
Ragweed, Short IgE: <0.1 kU/L
Sheep Sorrel IgE Qn: <0.1 kU/L
Timothy Grass IgE: <0.1 kU/L
White Mulberry IgE: <0.1 kU/L

## 2018-12-06 ENCOUNTER — Other Ambulatory Visit (HOSPITAL_COMMUNITY): Payer: Self-pay

## 2018-12-06 MED ORDER — BISOPROLOL FUMARATE 5 MG PO TABS: 5.0000 mg | ORAL_TABLET | Freq: Every day | ORAL | 1 refills | Status: DC

## 2018-12-11 ENCOUNTER — Other Ambulatory Visit: Payer: Self-pay | Admitting: Family Medicine

## 2018-12-16 ENCOUNTER — Ambulatory Visit (INDEPENDENT_AMBULATORY_CARE_PROVIDER_SITE_OTHER): Payer: Medicare HMO | Admitting: *Deleted

## 2018-12-16 DIAGNOSIS — Q246 Congenital heart block: Secondary | ICD-10-CM | POA: Diagnosis not present

## 2018-12-18 LAB — CUP PACEART REMOTE DEVICE CHECK
Battery Remaining Longevity: 13 mo
Brady Statistic AP VP Percent: 2.13 %
Brady Statistic AP VS Percent: 0.01 %
Brady Statistic AS VP Percent: 96.81 %
Brady Statistic AS VS Percent: 1.05 %
Brady Statistic RA Percent Paced: 2.13 %
Brady Statistic RV Percent Paced: 98.45 %
Date Time Interrogation Session: 20200228015913
Implantable Lead Implant Date: 19971017
Implantable Lead Implant Date: 19971017
Implantable Lead Implant Date: 20130520
Implantable Lead Location: 753858
Implantable Lead Location: 753859
Implantable Lead Location: 753860
Implantable Lead Model: 4196
Implantable Pulse Generator Implant Date: 20130520
Lead Channel Impedance Value: 228 Ohm
Lead Channel Impedance Value: 247 Ohm
Lead Channel Impedance Value: 266 Ohm
Lead Channel Impedance Value: 285 Ohm
Lead Channel Impedance Value: 380 Ohm
Lead Channel Impedance Value: 399 Ohm
Lead Channel Impedance Value: 418 Ohm
Lead Channel Impedance Value: 456 Ohm
Lead Channel Impedance Value: 665 Ohm
Lead Channel Pacing Threshold Amplitude: 0.5 V
Lead Channel Pacing Threshold Amplitude: 0.875 V
Lead Channel Pacing Threshold Amplitude: 1.25 V
Lead Channel Pacing Threshold Pulse Width: 0.4 ms
Lead Channel Pacing Threshold Pulse Width: 0.4 ms
Lead Channel Sensing Intrinsic Amplitude: 4.125 mV
Lead Channel Sensing Intrinsic Amplitude: 6.625 mV
Lead Channel Setting Pacing Amplitude: 2 V
Lead Channel Setting Pacing Amplitude: 2 V
Lead Channel Setting Pacing Amplitude: 2 V
Lead Channel Setting Pacing Pulse Width: 0.4 ms
Lead Channel Setting Pacing Pulse Width: 0.4 ms
Lead Channel Setting Sensing Sensitivity: 0.9 mV
MDC IDC MSMT BATTERY VOLTAGE: 2.88 V
MDC IDC MSMT LEADCHNL RA SENSING INTR AMPL: 4.125 mV
MDC IDC MSMT LEADCHNL RV PACING THRESHOLD PULSEWIDTH: 0.4 ms
MDC IDC MSMT LEADCHNL RV SENSING INTR AMPL: 6.625 mV

## 2018-12-18 IMAGING — DX DG CHEST 2V
2 series · 2 of 2 positions shown · non-contrast
Comparison: November 01, 2016

CLINICAL DATA: Cough and dyspnea for 2 weeks

EXAM:
CHEST  2 VIEW

[chest pa]
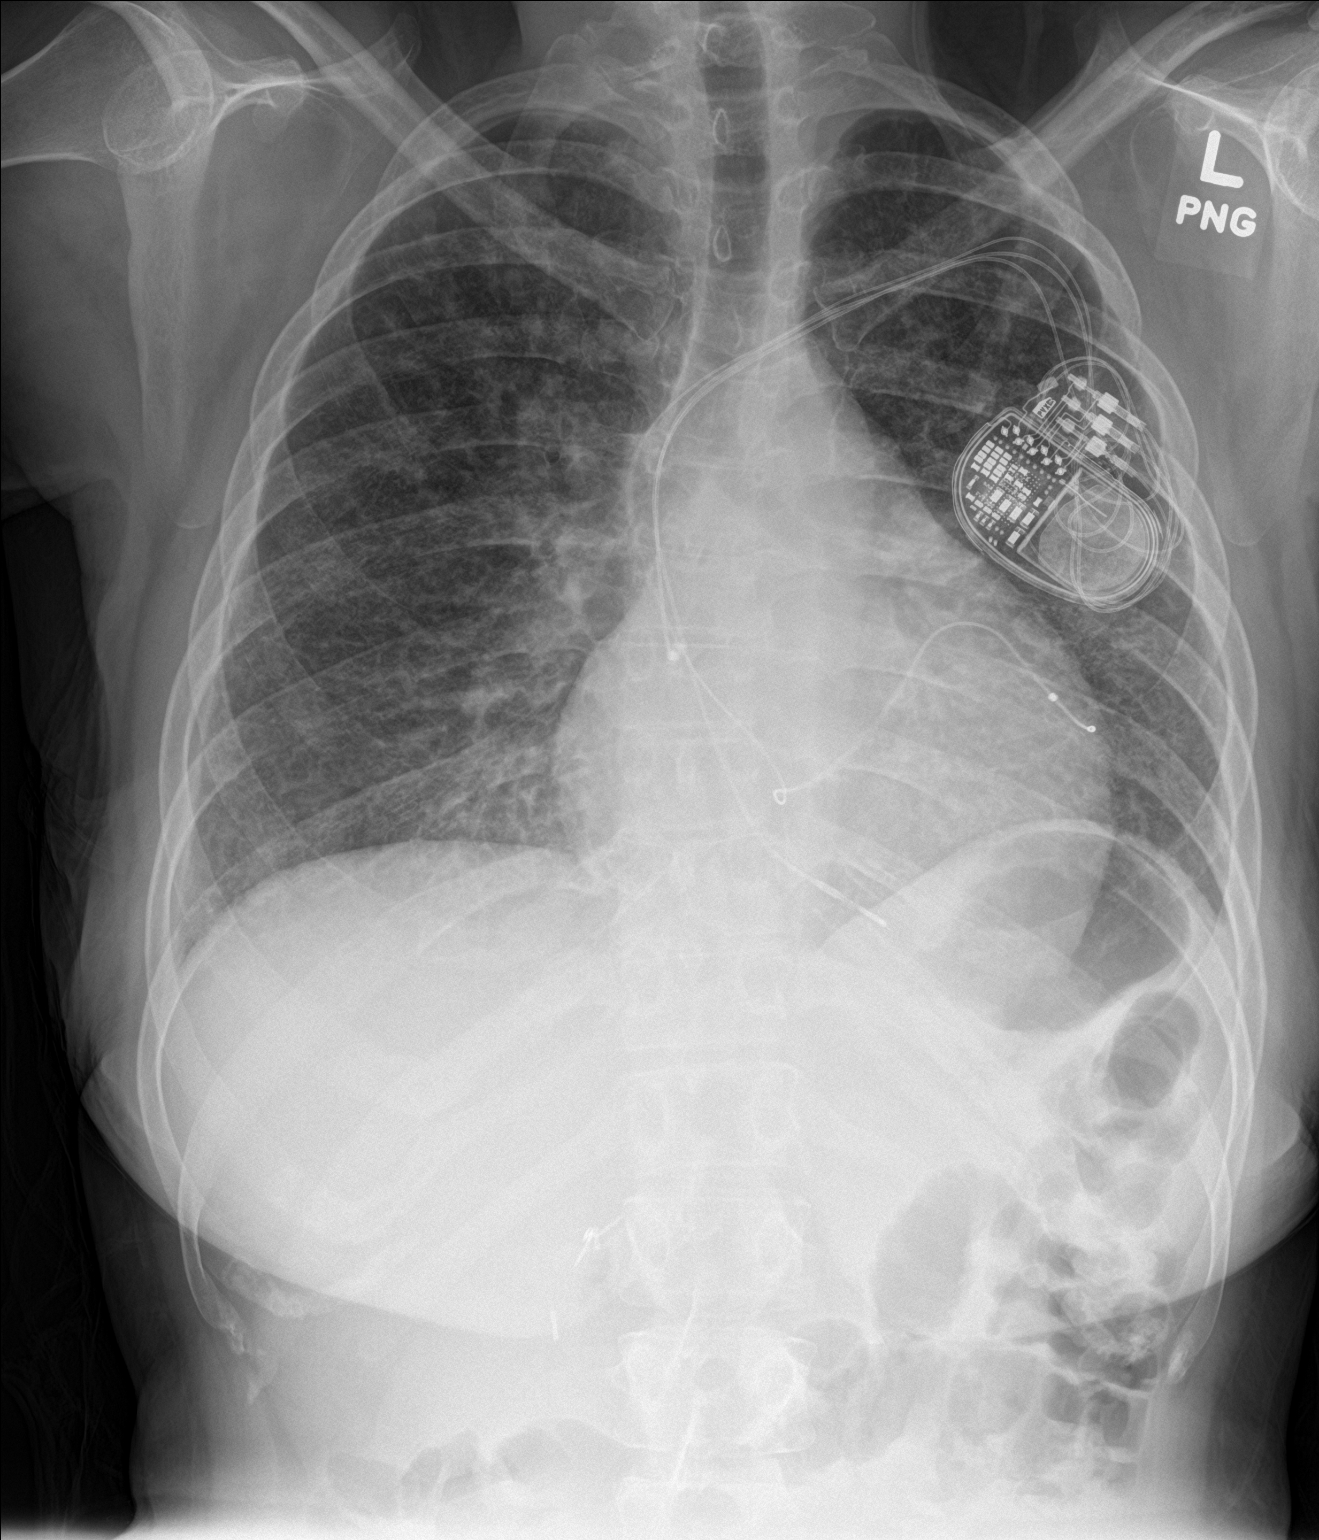

[chest lat]
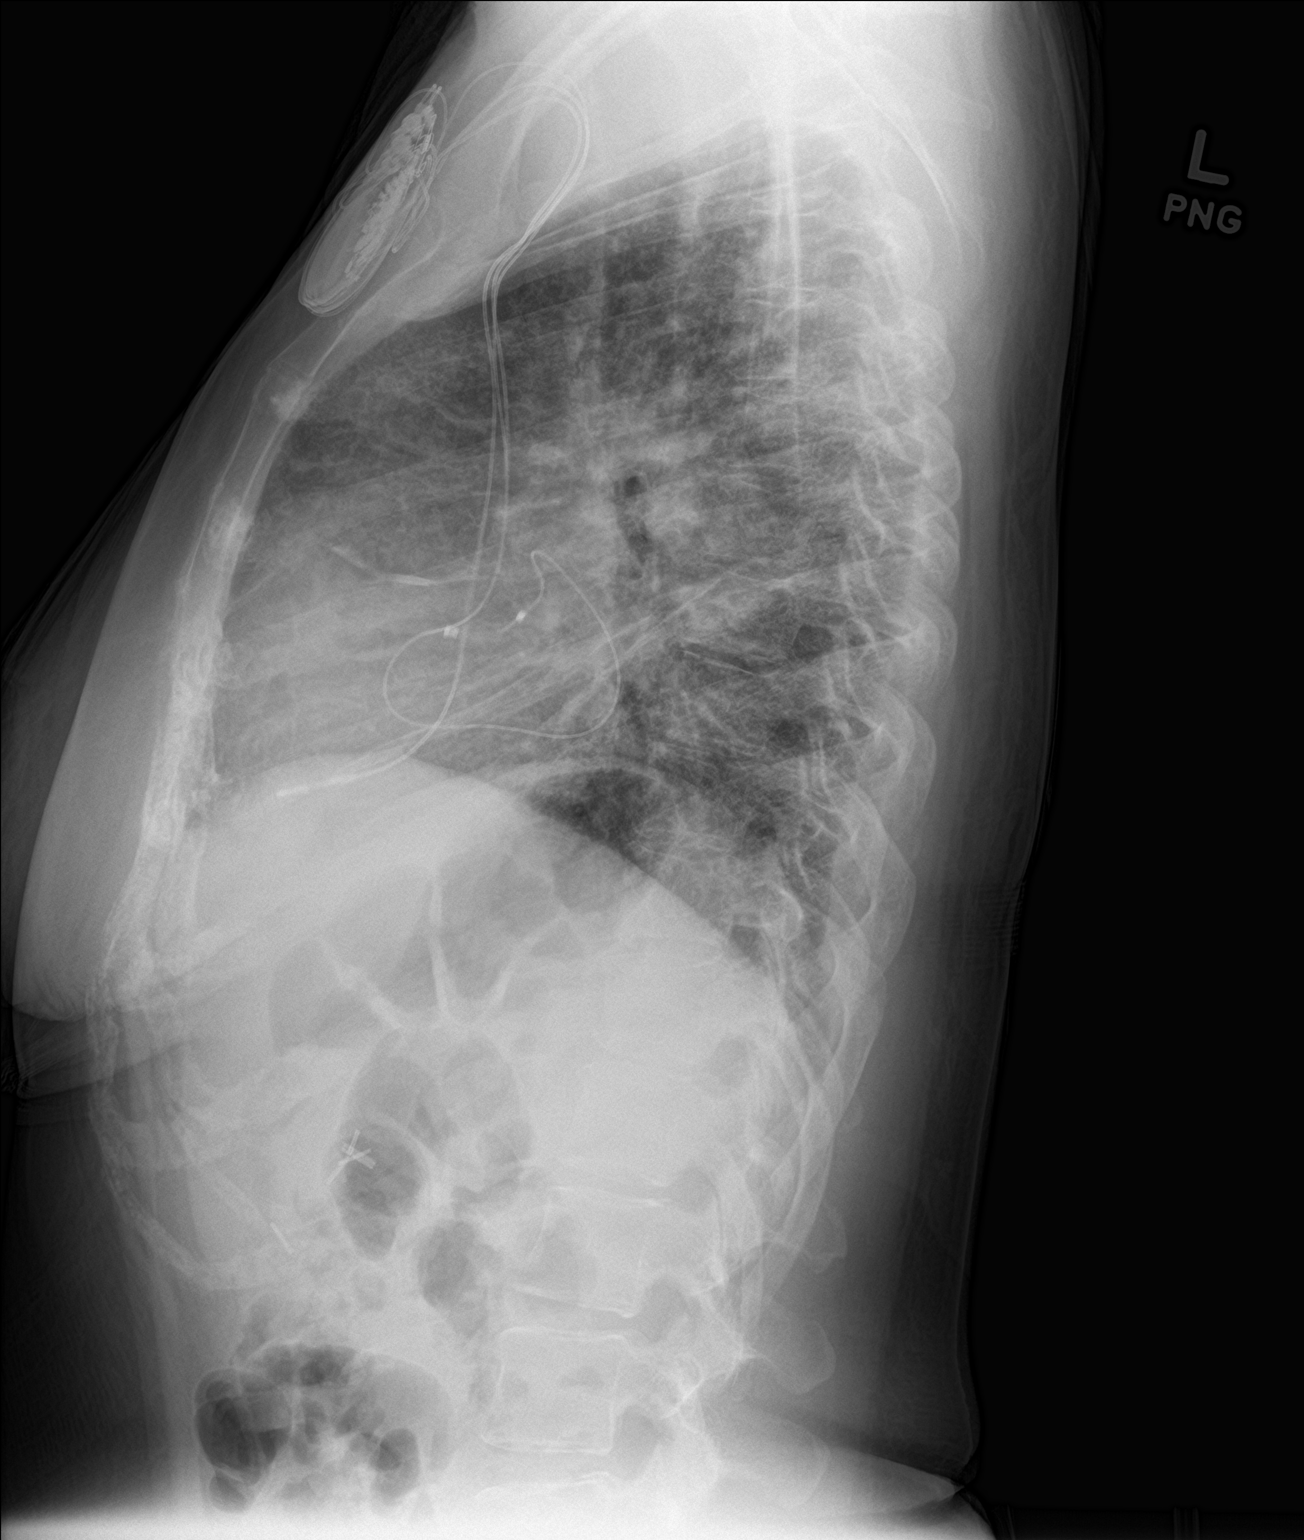

[2 of 2 positions shown; findings below may reference images not displayed]

FINDINGS: There is persistent cardiomegaly with pulmonary venous hypertension.
Pacemaker leads are attached to the right atrium, right ventricle,
and coronary sinus regions. There is interstitial prominence
bilaterally, likely edema. There is no airspace consolidation or
appreciable pleural effusion. No bone lesions. No evident
adenopathy.
IMPRESSION: Findings concerning for a degree of congestive heart failure.
Atypical pneumonia is a differential consideration, however. Both
pneumonia and congestive heart failure may present currently.
Appearance is similar to recent prior study.

These results will be called to the ordering clinician or
representative by the Radiologist Assistant, and communication
documented in the PACS or zVision Dashboard.

## 2018-12-22 ENCOUNTER — Encounter: Payer: Self-pay | Admitting: Cardiology

## 2018-12-22 NOTE — Progress Notes (Signed)
Remote pacemaker transmission.   

## 2018-12-27 ENCOUNTER — Encounter (HOSPITAL_COMMUNITY): Payer: Self-pay | Admitting: Emergency Medicine

## 2018-12-27 ENCOUNTER — Emergency Department (HOSPITAL_COMMUNITY)
Admission: EM | Admit: 2018-12-27 | Discharge: 2018-12-27 | Disposition: A | Discharge status: Home / Self Care | Payer: Medicare HMO | Attending: Emergency Medicine | Admitting: Emergency Medicine

## 2018-12-27 ENCOUNTER — Emergency Department (HOSPITAL_COMMUNITY): Payer: Medicare HMO

## 2018-12-27 ENCOUNTER — Inpatient Hospital Stay (HOSPITAL_COMMUNITY)
Admission: RE | Admit: 2018-12-27 | Discharge: 2018-12-27 | Disposition: A | Discharge status: Home / Self Care | Payer: Medicare HMO | Source: Ambulatory Visit

## 2018-12-27 ENCOUNTER — Other Ambulatory Visit: Payer: Self-pay

## 2018-12-27 DIAGNOSIS — J069 Acute upper respiratory infection, unspecified: Secondary | ICD-10-CM | POA: Insufficient documentation

## 2018-12-27 DIAGNOSIS — F319 Bipolar disorder, unspecified: Secondary | ICD-10-CM | POA: Diagnosis not present

## 2018-12-27 DIAGNOSIS — Z794 Long term (current) use of insulin: Secondary | ICD-10-CM | POA: Diagnosis not present

## 2018-12-27 DIAGNOSIS — Z79899 Other long term (current) drug therapy: Secondary | ICD-10-CM | POA: Diagnosis not present

## 2018-12-27 DIAGNOSIS — B9789 Other viral agents as the cause of diseases classified elsewhere: Secondary | ICD-10-CM

## 2018-12-27 DIAGNOSIS — Z9049 Acquired absence of other specified parts of digestive tract: Secondary | ICD-10-CM | POA: Insufficient documentation

## 2018-12-27 DIAGNOSIS — J849 Interstitial pulmonary disease, unspecified: Secondary | ICD-10-CM | POA: Insufficient documentation

## 2018-12-27 DIAGNOSIS — I110 Hypertensive heart disease with heart failure: Secondary | ICD-10-CM | POA: Insufficient documentation

## 2018-12-27 DIAGNOSIS — R109 Unspecified abdominal pain: Secondary | ICD-10-CM | POA: Insufficient documentation

## 2018-12-27 DIAGNOSIS — J45909 Unspecified asthma, uncomplicated: Secondary | ICD-10-CM | POA: Insufficient documentation

## 2018-12-27 DIAGNOSIS — I5032 Chronic diastolic (congestive) heart failure: Secondary | ICD-10-CM | POA: Insufficient documentation

## 2018-12-27 DIAGNOSIS — F419 Anxiety disorder, unspecified: Secondary | ICD-10-CM | POA: Insufficient documentation

## 2018-12-27 DIAGNOSIS — J988 Other specified respiratory disorders: Secondary | ICD-10-CM

## 2018-12-27 DIAGNOSIS — R0602 Shortness of breath: Secondary | ICD-10-CM | POA: Diagnosis not present

## 2018-12-27 DIAGNOSIS — E119 Type 2 diabetes mellitus without complications: Secondary | ICD-10-CM | POA: Diagnosis not present

## 2018-12-27 DIAGNOSIS — R000 Tachycardia, unspecified: Secondary | ICD-10-CM | POA: Diagnosis not present

## 2018-12-27 DIAGNOSIS — Z950 Presence of cardiac pacemaker: Secondary | ICD-10-CM | POA: Insufficient documentation

## 2018-12-27 DIAGNOSIS — Z87891 Personal history of nicotine dependence: Secondary | ICD-10-CM | POA: Diagnosis not present

## 2018-12-27 DIAGNOSIS — R0600 Dyspnea, unspecified: Secondary | ICD-10-CM | POA: Diagnosis not present

## 2018-12-27 LAB — URINALYSIS, ROUTINE W REFLEX MICROSCOPIC
Bilirubin Urine: NEGATIVE
Glucose, UA: NEGATIVE mg/dL
Ketones, ur: NEGATIVE mg/dL
Leukocytes,Ua: NEGATIVE
Nitrite: NEGATIVE
Protein, ur: NEGATIVE mg/dL
RBC / HPF: >50 RBC/hpf — ABNORMAL HIGH (ref 0–5)
Specific Gravity, Urine: 1.008 (ref 1.005–1.030)
pH: 5 (ref 5.0–8.0)

## 2018-12-27 LAB — COMPREHENSIVE METABOLIC PANEL
ALT: 6 U/L (ref 0–44)
AST: 15 U/L (ref 15–41)
Albumin: 3.5 g/dL (ref 3.5–5.0)
Alkaline Phosphatase: 61 U/L (ref 38–126)
Anion gap: 10 (ref 5–15)
BUN: 14 mg/dL (ref 6–20)
CO2: 24 mmol/L (ref 22–32)
Calcium: 8.9 mg/dL (ref 8.9–10.3)
Chloride: 102 mmol/L (ref 98–111)
Creatinine, Ser: 0.84 mg/dL (ref 0.44–1.00)
GFR calc Af Amer: >60 mL/min (ref >60)
GFR calc non Af Amer: >60 mL/min (ref >60)
Glucose, Bld: 133 mg/dL — ABNORMAL HIGH (ref 70–99)
Potassium: 4 mmol/L (ref 3.5–5.1)
Sodium: 136 mmol/L (ref 135–145)
Total Bilirubin: 0.6 mg/dL (ref 0.3–1.2)
Total Protein: 7.3 g/dL (ref 6.5–8.1)

## 2018-12-27 LAB — CBC WITH DIFFERENTIAL/PLATELET
Abs Immature Granulocytes: 0.03 10*3/uL (ref 0.00–0.07)
Basophils Absolute: 0 10*3/uL (ref 0.0–0.1)
Basophils Relative: 0 %
Eosinophils Absolute: 0.1 10*3/uL (ref 0.0–0.5)
Eosinophils Relative: 1 %
HCT: 34 % — ABNORMAL LOW (ref 36.0–46.0)
Hemoglobin: 9.8 g/dL — ABNORMAL LOW (ref 12.0–15.0)
Immature Granulocytes: 0 %
Lymphocytes Relative: 11 %
Lymphs Abs: 0.9 10*3/uL (ref 0.7–4.0)
MCH: 23.2 pg — ABNORMAL LOW (ref 26.0–34.0)
MCHC: 28.8 g/dL — ABNORMAL LOW (ref 30.0–36.0)
MCV: 80.4 fL (ref 80.0–100.0)
Monocytes Absolute: 0.4 10*3/uL (ref 0.1–1.0)
Monocytes Relative: 5 %
Neutro Abs: 6.8 10*3/uL (ref 1.7–7.7)
Neutrophils Relative %: 83 %
Platelets: 334 10*3/uL (ref 150–400)
RBC: 4.23 MIL/uL (ref 3.87–5.11)
RDW: 16.6 % — ABNORMAL HIGH (ref 11.5–15.5)
WBC: 8.3 10*3/uL (ref 4.0–10.5)
nRBC: 0 % (ref 0.0–0.2)

## 2018-12-27 LAB — LACTIC ACID, PLASMA: Lactic Acid, Venous: 1.3 mmol/L (ref 0.5–1.9)

## 2018-12-27 LAB — MRSA PCR SCREENING: MRSA by PCR: NEGATIVE

## 2018-12-27 LAB — INFLUENZA PANEL BY PCR (TYPE A & B)
Influenza A By PCR: NEGATIVE
Influenza B By PCR: NEGATIVE

## 2018-12-27 LAB — LIPASE, BLOOD: Lipase: 28 U/L (ref 11–51)

## 2018-12-27 LAB — BRAIN NATRIURETIC PEPTIDE: B Natriuretic Peptide: 23.9 pg/mL (ref 0.0–100.0)

## 2018-12-27 MED ORDER — VANCOMYCIN HCL IN DEXTROSE 1-5 GM/200ML-% IV SOLN
1000.0000 mg | Freq: Once | INTRAVENOUS | Status: AC
  Administered 2018-12-27: 1000 mg via INTRAVENOUS
  Filled 2018-12-27: qty 200

## 2018-12-27 MED ORDER — ALBUTEROL SULFATE (2.5 MG/3ML) 0.083% IN NEBU
5.0000 mg | INHALATION_SOLUTION | Freq: Once | RESPIRATORY_TRACT | Status: AC
  Administered 2018-12-27: 5 mg via RESPIRATORY_TRACT
  Filled 2018-12-27: qty 6

## 2018-12-27 MED ORDER — SODIUM CHLORIDE 0.9 % IV SOLN
2.0000 g | Freq: Once | INTRAVENOUS | Status: AC
  Administered 2018-12-27: 2 g via INTRAVENOUS
  Filled 2018-12-27: qty 20

## 2018-12-27 MED ORDER — PREDNISONE 20 MG PO TABS: 40.0000 mg | ORAL_TABLET | Freq: Every day | ORAL | 0 refills | Status: AC

## 2018-12-27 MED ORDER — IOHEXOL 350 MG/ML SOLN
100.0000 mL | Freq: Once | INTRAVENOUS | Status: AC | PRN
  Administered 2018-12-27: 58 mL via INTRAVENOUS

## 2018-12-27 MED ORDER — VANCOMYCIN HCL IN DEXTROSE 750-5 MG/150ML-% IV SOLN
750.0000 mg | Freq: Two times a day (BID) | INTRAVENOUS | Status: DC
  Filled 2018-12-27: qty 150

## 2018-12-27 MED ORDER — SODIUM CHLORIDE 0.9 % IV SOLN
1000.0000 mL | INTRAVENOUS | Status: DC
  Administered 2018-12-27: 1000 mL via INTRAVENOUS

## 2018-12-27 MED ORDER — SODIUM CHLORIDE 0.9 % IV SOLN
500.0000 mg | Freq: Once | INTRAVENOUS | Status: AC
  Administered 2018-12-27: 500 mg via INTRAVENOUS
  Filled 2018-12-27: qty 500

## 2018-12-27 NOTE — ED Notes (Signed)
Patient transported to CT 

## 2018-12-27 NOTE — ED Provider Notes (Signed)
[facility] EMERGENCY DEPARTMENT Provider Note   CSN: [id number] Arrival date & time: 12/27/18  1454    History   Chief Complaint Chief Complaint  Patient presents with  . Shortness of Breath    HPI Makayla Vasquez is a 38 y.o. female with history of lupus, CHF, diabetes, GERD, chronic cor pulmonale, pacemaker who presents with a one-month history of shortness of breath, nasal congestion, generalized weakness and a few day history of worsening shortness of breath, subjective fever, abdominal pain, and back pain.  Patient reports she had influenza around a month ago and has not fully recovered.  She was started on Augmentin by her doctor recently, but has not had any improvement.  She reports she has had intermittent generalized abdominal pain for the past few days.  She has had some diarrhea and nausea, but no vomiting.  She denies any bloody stools.  She denies any change in urination.  Patient is on 2 L of oxygen chronically, however has had to be on 4 L for the past few days.  She denies any new leg pain or swelling.  She denies any recent travel, but has been around her son who is had a cold.  She denies any saddle anesthesia, bowel or bladder incontinence, numbness or tingling.     HPI  Past Medical History:  Diagnosis Date  . Anxiety   . Arthritis    rheumatoid arthritis- mild, no rheumatology care   . Asthma    seasonal allergies   . Asymptomatic LV dysfunction    a. Echo in Dec 2011 with EF 35 to 40%. Felt to be due to paced rhythm. b. EF 25-30% in 07/2014.  . Bipolar affective disorder (HCC)   . Cardiac pacemaker    a. Since age 62 in 69. b. Upgrade to BiV in 2013.  [name] Carpal tunnel syndrome of right wrist   . CHF (congestive heart failure) (HCC)   . Congenital complete AV block   . Cor pulmonale, chronic (HCC) 04/08/2018  . Depression    bipolar  . Diabetes mellitus without complication (HCC)   . GERD (gastroesophageal reflux disease)   .  Hypertension   . Interstitial lung disease (HCC)   . Lupus (HCC)   . Lupus (systemic lupus erythematosus) (HCC)   . Obesity   . Pneumonitis    a. a/w hypoxia - inflammatory - large workup 07/2014.  . Presence of permanent cardiac pacemaker   . Seizures (HCC)    as a child- from high fever  . Sinus tachycardia     Patient Active Problem List   Diagnosis Date Noted  . Chronic nasal congestion 10/29/2018  . CAP (community acquired pneumonia) 10/24/2018  . Immunosuppression (HCC) 10/24/2018  . 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 Diabetes mellitus due to therapeutic use of corticosteroid (HCC) 10/24/2018  . Right wrist pain 07/24/2018  . Anxiety state 07/21/2018  . Upper airway cough syndrome 06/18/2018  . Cor pulmonale, chronic (HCC) 04/08/2018  . Abnormal uterine bleeding 02/14/2018  . Dyspareunia, female 05/14/2017  . Sepsis without acute organ dysfunction (HCC) 01/05/2017  . 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 On Cellcept therapy   . Lupus (HCC)   . Fever   . Long term (current) use of systemic steroids   . Chronic diastolic congestive heart failure (HCC)   . Interstitial lung disease (HCC)   . Anemia   . Bipolar affective disorder in remission (HCC)   .  Acute on chronic congestive heart failure (HCC)   . 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 Adrenal insufficiency (HCC)   . Antisynthetase syndrome (HCC)   . Idiopathic hypotension 07/08/2015  . Hyperlipidemia associated with type 2 diabetes mellitus (HCC) 05/04/2015  . Diabetes mellitus type 2 in obese (HCC)   . Chronic respiratory failure with hypoxia (HCC) 08/07/2014  . Acute respiratory failure with hypoxia (HCC) 06/19/2014  . Congestive dilated cardiomyopathy (HCC) 05/31/2014  . Chronic systolic heart failure (HCC) 02/26/2012  . PPM-Medtronic   . Congenital complete AV block   . GERD (gastroesophageal reflux disease)   . 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 Polycystic ovaries 12/17/2006    Past Surgical History:  Procedure Laterality Date  . ATRIAL TACH ABLATION N/A 08/14/2014   Procedure: ATRIAL TACH ABLATION;  Surgeon: Marinus Maw, MD;  Location: [facility] CATH LAB;  Service: Cardiovascular;  Laterality: N/A;  . BI-VENTRICULAR PACEMAKER UPGRADE N/A 03/08/2012   Procedure: BI-VENTRICULAR PACEMAKER UPGRADE;  Surgeon: Marinus Maw, MD;  Location: [facility] CATH LAB;  Service: Cardiovascular;  Laterality: N/A;  . CARPAL TUNNEL WITH CUBITAL TUNNEL Right 07/26/2013   Procedure: RIGHT LIMITED OPEN CARPAL TUNNEL RELEASE ,  RIGHT CUBITAL TUNNEL RELEASE, INSITU VERSES ULNAR NERVE DECOMPRESSION AND ANTERIOR TRANSPOSITION;  Surgeon: Dominica Severin, MD;  Location: [facility] OR;  Service: Orthopedics;  Laterality: Right;  . CESAREAN SECTION    . CHOLECYSTECTOMY    . CYST EXCISION  12/10/2012   THYROID  . INSERT / REPLACE / REMOVE PACEMAKER     2001  . IUD REMOVAL  11/03/2011   Procedure: INTRAUTERINE DEVICE (IUD) REMOVAL;  Surgeon: Myra C. Marice Potter, MD;  Location: [facility] ORS;  Service: Gynecology;  Laterality: N/A;  . NASAL FRACTURE SURGERY     /w plate   . RIGHT HEART CATHETERIZATION N/A 10/26/2014   Procedure: RIGHT HEART CATH;  Surgeon: Dolores Patty, MD;  Location: [facility] CATH LAB;  Service: Cardiovascular;  Laterality: N/A;  . THROAT SURGERY  1994   s/p laser treatment  . THYROGLOSSAL DUCT CYST N/A 12/10/2012   Procedure: REVISION OF THYROGLOSSAL DUCT CYST EXCISION;  Surgeon: Serena Colonel, MD;  Location: [facility] OR;  Service: ENT;  Laterality: N/A;  Revision of Thyroglossal Duct Cyst Excision  . VIDEO BRONCHOSCOPY Bilateral 06/19/2014   Procedure: VIDEO BRONCHOSCOPY WITHOUT FLUORO;  Surgeon: Kalman Shan, MD;  Location: [facility] ENDOSCOPY;  Service: Cardiopulmonary;  Laterality: Bilateral;     OB History    Gravida  1   Para  1   Term  1   Preterm      AB      Living  1     SAB      TAB      Ectopic      Multiple      Live Births                Home Medications    Prior to Admission medications   Medication Sig Start Date End Date Taking? Authorizing Provider  albuterol (PROVENTIL HFA;VENTOLIN HFA) 108 (90 Base) MCG/ACT inhaler INHALE 2 PUFFS INTO THE LUNGS EVERY 4 HOURS AS NEEDED FOR WHEEZING OR SHORTNESS OF BREATH. Patient taking differently: Inhale 2 puffs into the lungs every 4 (four) hours as needed for wheezing or shortness of breath.  12/13/18  Yes Wendee Beavers, DO  Azelastine-Fluticasone (DYMISTA) 137-50 MCG/ACT SUSP Place 2 sprays into both nostrils 1 day or 1 dose. 12/01/18  Yes Padgett, Pilar Grammes,  MD  bisoprolol (ZEBETA) 5 MG tablet Take 1 tablet (5 mg total) by mouth daily. 12/06/18  Yes Laurey Morale, MD  dextromethorphan-guaiFENesin (ROBITUSSIN-DM) 10-100 MG/5ML liquid TAKE 5 MLS BY MOUTH EVERY 4 (FOUR) HOURS AS NEEDED FOR COUGH. Patient taking differently: Take 5 mLs by mouth every 4 (four) hours as needed for cough.  10/27/18  Yes Wendee Beavers, DO  Etonogestrel (NEXPLANON Boyd) Inject 1 application into the skin once.    Yes [provider]  furosemide (LASIX) 40 MG tablet TAKE 1 TABLET BY MOUTH ONCE DAILY Patient taking differently: Take 40 mg by mouth daily.  07/29/18  Yes Laurey Morale, MD  LANTUS SOLOSTAR 100 UNIT/ML Solostar Pen INJECT 15 UNITS SUBCUTANEOUSLY EVERY DAY Patient taking differently: Inject 15 Units into the skin daily.  04/26/18  Yes Wendee Beavers, DO  metFORMIN (GLUCOPHAGE) 1000 MG tablet Take 1 tablet (1,000 mg total) by mouth 2 (two) times daily with a meal. 10/22/18  Yes Wendee Beavers, DO  mycophenolate (CELLCEPT) 500 MG tablet Take 1,000 mg by mouth 2 (two) times daily.    Yes [provider]  NOVOLOG FLEXPEN 100 UNIT/ML FlexPen INJECT 8 UNITS INTO THE SKIN 3 TIMES DAILY WITH MEALS. Patient taking differently: Inject 8 Units into the skin 3 (three) times daily with meals.  11/02/18  Yes Wendee Beavers, DO  omeprazole (PRILOSEC) 20 MG capsule Take 1  capsule (20 mg total) by mouth daily. 09/03/18  Yes Coral Ceo, NP  OXYGEN Inhale 2-4 L into the lungs.    Yes [provider]  potassium chloride SA (K-DUR,KLOR-CON) 20 MEQ tablet Take 1 tablet (20 mEq total) by mouth daily. 09/02/18  Yes Bensimhon, Bevelyn Buckles, MD  predniSONE (DELTASONE) 10 MG tablet Take 10 mg by mouth daily with breakfast.   Yes [provider]  tacrolimus (PROGRAF) 0.5 MG capsule Take 1.5 mg by mouth 2 (two) times daily. 11/27/17  Yes [provider]  traZODone (DESYREL) 50 MG tablet TAKE 1 TABLET (50 MG TOTAL) BY MOUTH AT BEDTIME AS NEEDED FOR SLEEP. 09/27/18  Yes Wendee Beavers, DO  benzonatate (TESSALON) 100 MG capsule Take 1 capsule (100 mg total) by mouth 2 (two) times daily as needed for cough. Patient not taking: Reported on 12/27/2018 11/23/18   Arlyce Harman, DO  predniSONE (DELTASONE) 20 MG tablet Take 2 tablets (40 mg total) by mouth daily for 3 days. 12/27/18 12/30/18  Emi Holes, PA-C    Family History Family History  Problem Relation Age of Onset  . Heart disease Mother        CHF (no details)  . Hypertension Mother   . Lupus Mother   . Heart disease Father        Murmur  . Heart disease Sister 31        No details.  History of a pacemaker    Social History Social History   Tobacco Use  . Smoking status: Former Smoker    Packs/day: 0.25    Years: 0.50    Pack years: 0.12    Types: Cigarettes    Last attempt to quit: 07/25/1996    Years since quitting: 22.4  . Smokeless tobacco: Never Used  Substance Use Topics  . Alcohol use: No  . Drug use: No     Allergies   Atorvastatin; Sertraline hcl; and Tape   Review of Systems Review of Systems  Constitutional: Positive for fatigue and fever. Negative for chills.  HENT:  Positive for congestion and rhinorrhea. Negative for facial swelling and sore throat.   Respiratory: Positive for cough and shortness of breath.   Cardiovascular: Negative for chest pain.    Gastrointestinal: Positive for abdominal pain, diarrhea and nausea. Negative for blood in stool and vomiting.  Genitourinary: Negative for dysuria.  Musculoskeletal: Positive for back pain.  Skin: Negative for rash and wound.  Neurological: Positive for weakness. Negative for headaches.  Psychiatric/Behavioral: The patient is not nervous/anxious.      Physical Exam Updated Vital Signs BP 106/72   Pulse (!) 101   Temp 98.5 F (36.9 C) (Oral)   Resp 19   Ht  (1.651 m)   Wt 62.1 kg   LMP  (LMP Unknown)   SpO2 99%   BMI 22.80 kg/m   Physical Exam Vitals signs and nursing note reviewed.  Constitutional:      General: She is not in acute distress.    Appearance: She is well-developed. She is not diaphoretic.  HENT:     Head: Normocephalic and atraumatic.     Mouth/Throat:     Pharynx: No oropharyngeal exudate.  Eyes:     General: No scleral icterus.       Right eye: No discharge.        Left eye: No discharge.     Conjunctiva/sclera: Conjunctivae normal.     Pupils: Pupils are equal, round, and reactive to light.  Neck:     Musculoskeletal: Normal range of motion and neck supple.     Thyroid: No thyromegaly.  Cardiovascular:     Rate and Rhythm: Regular rhythm. Tachycardia present.     Heart sounds: Normal heart sounds. No murmur. No friction rub. No gallop.   Pulmonary:     Effort: Pulmonary effort is normal. Tachypnea present. No respiratory distress.     Breath sounds: No stridor. Decreased breath sounds (bilateral bases) present. No wheezing or rales.  Abdominal:     General: Bowel sounds are normal. There is no distension.     Palpations: Abdomen is soft.     Tenderness: There is no abdominal tenderness. There is no guarding or rebound.  Lymphadenopathy:     Cervical: No cervical adenopathy.  Skin:    General: Skin is warm and dry.     Coloration: Skin is not pale.     Findings: No rash.  Neurological:     Mental Status: She is alert.     Coordination:  Coordination normal.     Comments: 5/5 strength and normal sensation to bilateral lower extremities      ED Treatments / Results  Labs (all labs ordered are listed, but only abnormal results are displayed) Labs Reviewed  COMPREHENSIVE METABOLIC PANEL - Abnormal; Notable for the following components:      Result Value   Glucose, Bld 133 (*)    All other components within normal limits  CBC WITH DIFFERENTIAL/PLATELET - Abnormal; Notable for the following components:   Hemoglobin 9.8 (*)    HCT 34.0 (*)    MCH 23.2 (*)    MCHC 28.8 (*)    RDW 16.6 (*)    All other components within normal limits  URINALYSIS, ROUTINE W REFLEX MICROSCOPIC - Abnormal; Notable for the following components:   Color, Urine STRAW (*)    APPearance HAZY (*)    Hgb urine dipstick LARGE (*)    RBC / HPF >50 (*)    Bacteria, UA FEW (*)    All other components within  normal limits  MRSA PCR SCREENING  CULTURE, BLOOD (ROUTINE X 2)  CULTURE, BLOOD (ROUTINE X 2)  LACTIC ACID, PLASMA  LIPASE, BLOOD  INFLUENZA PANEL BY PCR (TYPE A & B)  BRAIN NATRIURETIC PEPTIDE    EKG EKG Interpretation  Date/Time:  Monday December 27 2018 15:03:15 EDT Ventricular Rate:  108 PR Interval:    QRS Duration: 116 QT Interval:  375 QTC Calculation: 503 R Axis:   -157 Text Interpretation:  Sinus tachycardia Nonspecific intraventricular conduction delay Anterolateral infarct, age indeterminate Confirmed by Virgina NorfolkAdam, [place] [phone]([id number]) on 12/27/2018 3:52:50 PM   Radiology Ct Angio Chest Pe W And/or Wo Contrast  Addendum Date: 12/27/2018   ADDENDUM REPORT: 12/27/2018 18:26 ADDENDUM: 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 Electronically Signed   By: Paulina FusiMark  Shogry M.D.   On: 12/27/2018 18:26   Result Date: 12/27/2018 CLINICAL DATA:  Abdominal pain and back pain over the last several days. Shortness of breath. Recent influenza. EXAM: CT ANGIOGRAPHY CHEST WITH CONTRAST TECHNIQUE: Multidetector CT imaging of the chest was performed using the  standard protocol during bolus administration of intravenous contrast. Multiplanar CT image reconstructions and MIPs were obtained to evaluate the vascular anatomy. CONTRAST:  58mL OMNIPAQUE IOHEXOL 350 MG/ML SOLN COMPARISON:  Chest radiography same day. Chest radiography 10/24/2018. Chest CT 09/21/2018 FINDINGS: Cardiovascular: Pulmonary arterial opacification is satisfactory. No pulmonary emboli are seen. There is also good systemic arterial opacification. No evidence of dissection. There is cardiomegaly. Pacemaker leads in place. Mediastinum/Nodes: No hilar lymphadenopathy. No paratracheal lymphadenopathy. Lungs/Pleura: Chronic interstitial lung disease as described previously on high-resolution exam. Possible sarcoid. Allowing for breathing motion, the pattern is similar without evidence of acute infectious consolidation, lobar collapse or pleural effusion. Certainly there could be low level inflammatory change. Upper Abdomen: See results of abdominal CT. Musculoskeletal: Mild spinal curvature. Congenital anomaly of T12, butter fly vertebra . No acute bone finding. Review of the MIP images confirms the above findings. IMPRESSION: No sign of pulmonary emboli. No acute aortic finding. Cardiomegaly and pacemaker. Chronic interstitial lung disease, not visibly changed since the study of December. That high-resolution examination favored sarcoid. I do not see acute infiltrate, collapse or effusion. Electronically Signed: By: Paulina FusiMark  Shogry M.D. On: 12/27/2018 18:20   Dg Chest Port 1 View  Result Date: 12/27/2018 CLINICAL DATA:  Cough and shortness of breath. EXAM: PORTABLE CHEST 1 VIEW COMPARISON:  October 24, 2018 FINDINGS: Stable cardiomegaly. Stable pacemaker. The hila and mediastinum are normal. Diffuse interstitial opacities are similar since previous studies. A recent high-resolution CT scan suggested sarcoidosis. No other acute interval changes. IMPRESSION: 1. No interval change in cardiomegaly or diffuse  interstitial opacities. 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 Recommend clinical correlation. No acute change. Electronically Signed   By: Gerome Samavid  Williams III M.D   On: 12/27/2018 16:11   Ct Renal Stone Study  Result Date: 12/27/2018 CLINICAL DATA:  Abdominal and back pain over the last several days. Assess for renal stone disease. EXAM: CT ABDOMEN AND PELVIS WITHOUT CONTRAST TECHNIQUE: Multidetector CT imaging of the abdomen and pelvis was performed following the standard protocol without IV contrast. COMPARISON:  01/04/2017 FINDINGS: Lower chest: See results of chest CT. Chronic interstitial lung disease. Cardiomegaly and pacemaker. Hepatobiliary: No liver parenchymal abnormality seen. Previous cholecystectomy. Pancreas: Normal Spleen: Normal Adrenals/Urinary Tract: Adrenal glands are normal. Kidneys are normal. No cyst, mass, stone or hydronephrosis. No stone in the bladder. Stomach/Bowel: No abnormal bowel finding by CT.  Normal appendix. Vascular/Lymphatic: Mild aortoiliac atherosclerosis. No  aneurysm. IVC is normal. No retroperitoneal adenopathy. Reproductive: Normal Other: No free fluid or air. Musculoskeletal: No acute lumbar or pelvic finding. IMPRESSION: Negative abdominal study. No evidence of urinary tract stone disease or other cause of lumbar region back pain. Electronically Signed   By: Paulina Fusi M.D.   On: 12/27/2018 18:24    Procedures Procedures (including critical care time)  Medications Ordered in ED Medications  0.9 %  sodium chloride infusion (1,000 mLs Intravenous New Bag/Given 12/27/18 1545)  vancomycin (VANCOCIN) IVPB 1000 mg/200 mL premix (0 mg Intravenous Stopped 12/27/18 1714)  cefTRIAXone (ROCEPHIN) 2 g in sodium chloride 0.9 % 100 mL IVPB (0 g Intravenous Stopped 12/27/18 1714)  azithromycin (ZITHROMAX) 500 mg in sodium chloride 0.9 % 250 mL IVPB (500 mg Intravenous New Bag/Given 12/27/18 1725)  albuterol (PROVENTIL) (2.5 MG/3ML) 0.083% nebulizer  solution 5 mg (5 mg Nebulization Given 12/27/18 1627)  iohexol (OMNIPAQUE) 350 MG/ML injection 100 mL (58 mLs Intravenous Contrast Given 12/27/18 1756)     Initial Impression / Assessment and Plan / ED Course  I have reviewed the triage vital signs and the nursing notes.  Pertinent labs & imaging results that were available during my care of the patient were reviewed by me and considered in my medical decision making (see chart for details).        Patient with chronic lung disease and recent influenza 1 month ago presenting with increased oxygen requirement at home and shortness of breath.  Patient initially meeting sepsis criteria, with antibiotics and fluids initiated. Patient reports she is typically tachycardic.  She has no leukocytosis.  CMP unremarkable.  CT angios of the chest shows chronic findings only, no PE or other acute findings.  CT renal stone study is negative.  Influenza negative.  Patient is requiring only 2 L, her baseline, in the ED.  She is feeling improved after albuterol nebulizer in the ED.  Patient has inhaler at home.  UA shows large hematuria and patient is spotting, which is normal for her.  No signs of new illness.  Blood cultures pending.  Will increase prednisone for the next 3 days to 40 mg from patient's baseline of 10 mg with close follow-up to PCP.  Strict return precautions discussed including worsening shortness of breath, chest pain, worsening abdominal pain, persistent fever 100.4.  She understands and agrees with plan.  Patient vital stable and discharged in satisfactory condition.  Patient also evaluated by my attending, Dr. Lockie Mola, who guided the patient's management and agrees with plan.  Final Clinical Impressions(s) / ED Diagnoses   Final diagnoses:  Shortness of breath  Viral respiratory illness  Chronic interstitial lung disease [facility])    ED Discharge Orders         Ordered    predniSONE (DELTASONE) 20 MG tablet  Daily     12/27/18 1903            Emi Holes, PA-C 12/27/18 2028    Virgina Norfolk, DO 12/28/18 0116

## 2018-12-27 NOTE — Discharge Instructions (Addendum)
Complete your antibiotics at home.  Take prednisone 40 mg for the next 3 days.  Use albuterol inhaler every 4-6 hours as needed for shortness of breath, chest tightness, or wheezing.  Recommend using a heating pad on your back, alternating 20 minutes on, 20 minutes off.  You can take Tylenol as prescribed over-the-counter, as needed for pain.  Please see your doctor in 2 to 3 days for recheck.  Please return to the emergency department if you develop any new or worsening symptoms.

## 2018-12-27 NOTE — ED Provider Notes (Signed)
Medical screening examination/treatment/procedure(s) were conducted as a shared visit with non-physician practitioner(s) and myself.  I personally evaluated the patient during the encounter. Briefly, the patient is a 38 y.o. female with history of heart block status post pacemaker, interstitial lung disease, lupus who presents to the ED with shortness of breath.  Patient with mild tachycardia, stable on 2 L of oxygen.  Patient states that she is been on Augmentin and steroids for recent infection but noticed some increased shortness of breath, increased need for oxygen at 4 L more often than not.  She has had cough.  Denies any chest pain.  Has had some flank pain in the abdomen area as well.  Denies any history of kidney stones.  Overall patient is well-appearing.  Clear breath sounds.  No signs of volume overload.  Infectious work-up was started by my physician assistant and IV antibiotics were given in case that this was a failure of outpatient treatment.  However, work-up overall unremarkable.  CT scan of the chest showed no PE, no infection.  Abdominal CT showed no acute process including no kidney stones.  No signs of urinary tract infection.  Patient with normal influenza testing.  Normal lactic acid.  No leukocytosis.  No significant anemia, electrolyte abnormality.  Patient overall likely with resolving infectious process.  Recommend follow-up with primary care doctor.  Given return precautions and discharged from ED in good condition.   This chart was dictated using voice recognition software.  Despite best efforts to proofread,  errors can occur which can change the documentation meaning.     EKG Interpretation  Date/Time:  Monday December 27 2018 15:03:15 EDT Ventricular Rate:  108 PR Interval:    QRS Duration: 116 QT Interval:  375 QTC Calculation: 503 R Axis:   -157 Text Interpretation:  Sinus tachycardia Nonspecific intraventricular conduction delay Anterolateral infarct, age indeterminate  Confirmed by Virgina Norfolk [phone]) on 12/27/2018 3:52:50 PM           Virgina Norfolk, DO 12/27/18 2025

## 2018-12-27 NOTE — ED Notes (Signed)
Called [facility] 

## 2018-12-27 NOTE — ED Triage Notes (Signed)
Pt arrives via EMS from home with abd and back pain for 2-3 days. Pt endorsed SOB, cough and runny nose for about a month. Pt states she had the flu a month ago and hasn't recovered. Hx of lupus, CHF, pacemaker.

## 2018-12-27 NOTE — Progress Notes (Signed)
Pharmacy Antibiotic Note  Makayla Vasquez is a 38 y.o. female admitted on 12/27/2018 with sepsis.  Pharmacy has been consulted for pneumonia dosing.  Plan:  Give vancomycin 1g IV x 1, then start vancomycin 750mg  IV Q12h Monitor clinical picture, renal function, vanc levels prn F/U C&S, abx deescalation / LOT  Height: 5\' 5"  (165.1 cm) Weight: 137 lb (62.1 kg) IBW/kg (Calculated) : 57  Temp (24hrs), Avg:98.5 F (36.9 C), Min:98.5 F (36.9 C), Max:98.5 F (36.9 C)  No results for input(s): WBC, CREATININE, LATICACIDVEN, VANCOTROUGH, VANCOPEAK, VANCORANDOM, GENTTROUGH, GENTPEAK, GENTRANDOM, TOBRATROUGH, TOBRAPEAK, TOBRARND, AMIKACINPEAK, AMIKACINTROU, AMIKACIN in the last 168 hours.  CrCl cannot be calculated (Patient's most recent lab result is older than the maximum 21 days allowed.).    Allergies  Allergen Reactions  . 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 Sertraline Hcl Hives  . Tape Other (See Comments)    Burns skin    Thank you for allowing pharmacy to be a part of this patient's care.  Makayla Vasquez 12/27/2018 3:16 PM

## 2018-12-30 DIAGNOSIS — T380X5D Adverse effect of glucocorticoids and synthetic analogues, subsequent encounter: Secondary | ICD-10-CM | POA: Diagnosis not present

## 2018-12-30 DIAGNOSIS — G720 Drug-induced myopathy: Secondary | ICD-10-CM | POA: Diagnosis not present

## 2018-12-30 DIAGNOSIS — R52 Pain, unspecified: Secondary | ICD-10-CM | POA: Diagnosis not present

## 2018-12-30 DIAGNOSIS — M6019 Interstitial myositis, multiple sites: Secondary | ICD-10-CM | POA: Diagnosis not present

## 2018-12-30 DIAGNOSIS — J849 Interstitial pulmonary disease, unspecified: Secondary | ICD-10-CM | POA: Diagnosis not present

## 2018-12-30 DIAGNOSIS — Z79899 Other long term (current) drug therapy: Secondary | ICD-10-CM | POA: Diagnosis not present

## 2018-12-30 DIAGNOSIS — Q249 Congenital malformation of heart, unspecified: Secondary | ICD-10-CM | POA: Diagnosis not present

## 2018-12-30 DIAGNOSIS — D8989 Other specified disorders involving the immune mechanism, not elsewhere classified: Secondary | ICD-10-CM | POA: Diagnosis not present

## 2018-12-30 DIAGNOSIS — R748 Abnormal levels of other serum enzymes: Secondary | ICD-10-CM | POA: Diagnosis not present

## 2018-12-31 ENCOUNTER — Encounter: Payer: Self-pay | Admitting: Family Medicine

## 2019-01-01 ENCOUNTER — Telehealth (HOSPITAL_BASED_OUTPATIENT_CLINIC_OR_DEPARTMENT_OTHER): Payer: Self-pay | Admitting: Emergency Medicine

## 2019-01-01 LAB — CULTURE, BLOOD (ROUTINE X 2)
Culture: NO GROWTH
Special Requests: ADEQUATE

## 2019-01-01 NOTE — Telephone Encounter (Signed)
Received call from micro lab. Consulted with Dr. Manus Gunning who advised pt should return to ED to have [facility] redrawn. Will have day shift charge nurse make contact with pt.

## 2019-01-02 IMAGING — DX DG CHEST 2V
2 series · 2 of 2 positions shown · non-contrast
Comparison: 11/24/2016.

CLINICAL DATA: Dyspnea.  Midline chest pain.  Congenital AV block.

EXAM:
CHEST  2 VIEW

[chest lat]
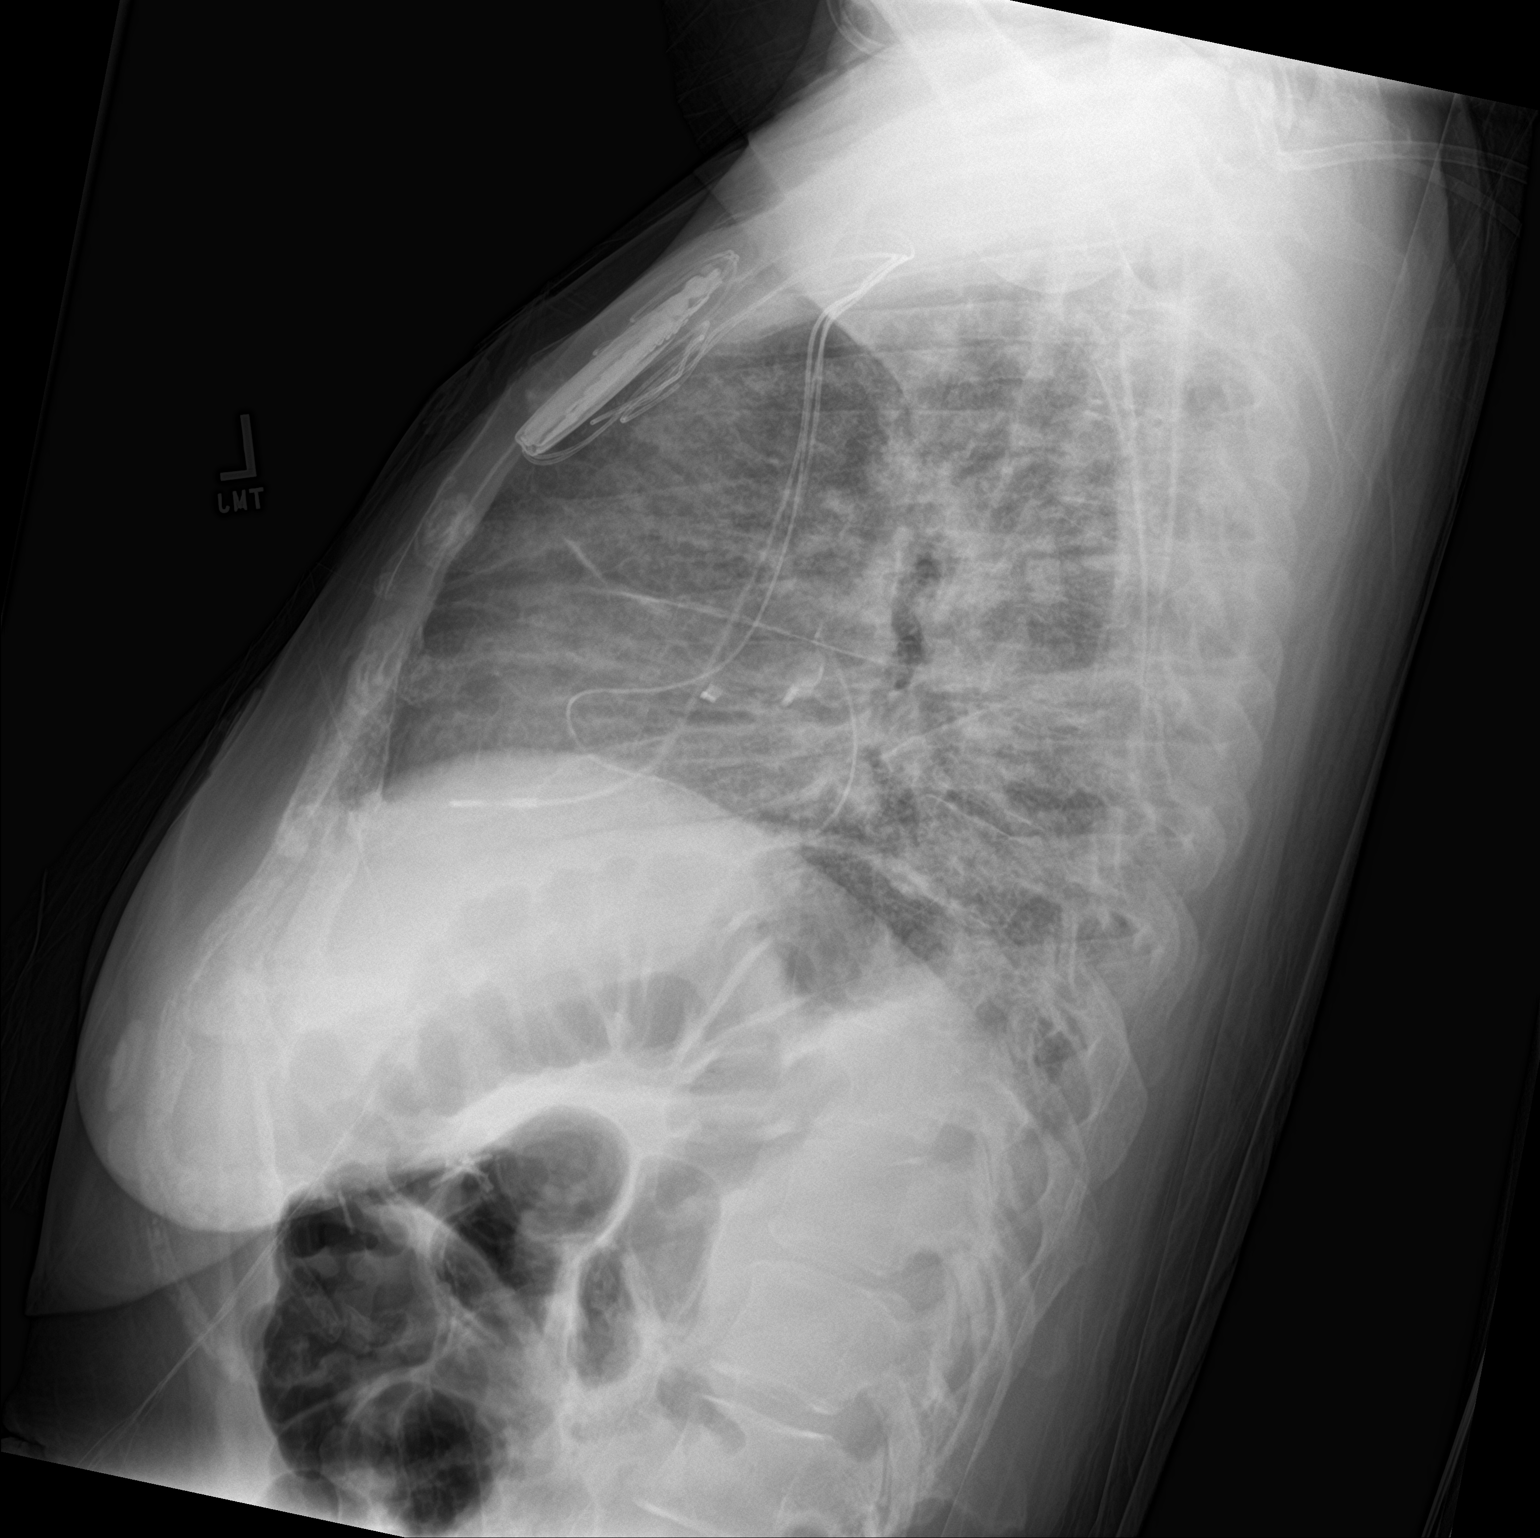

[chest ap]
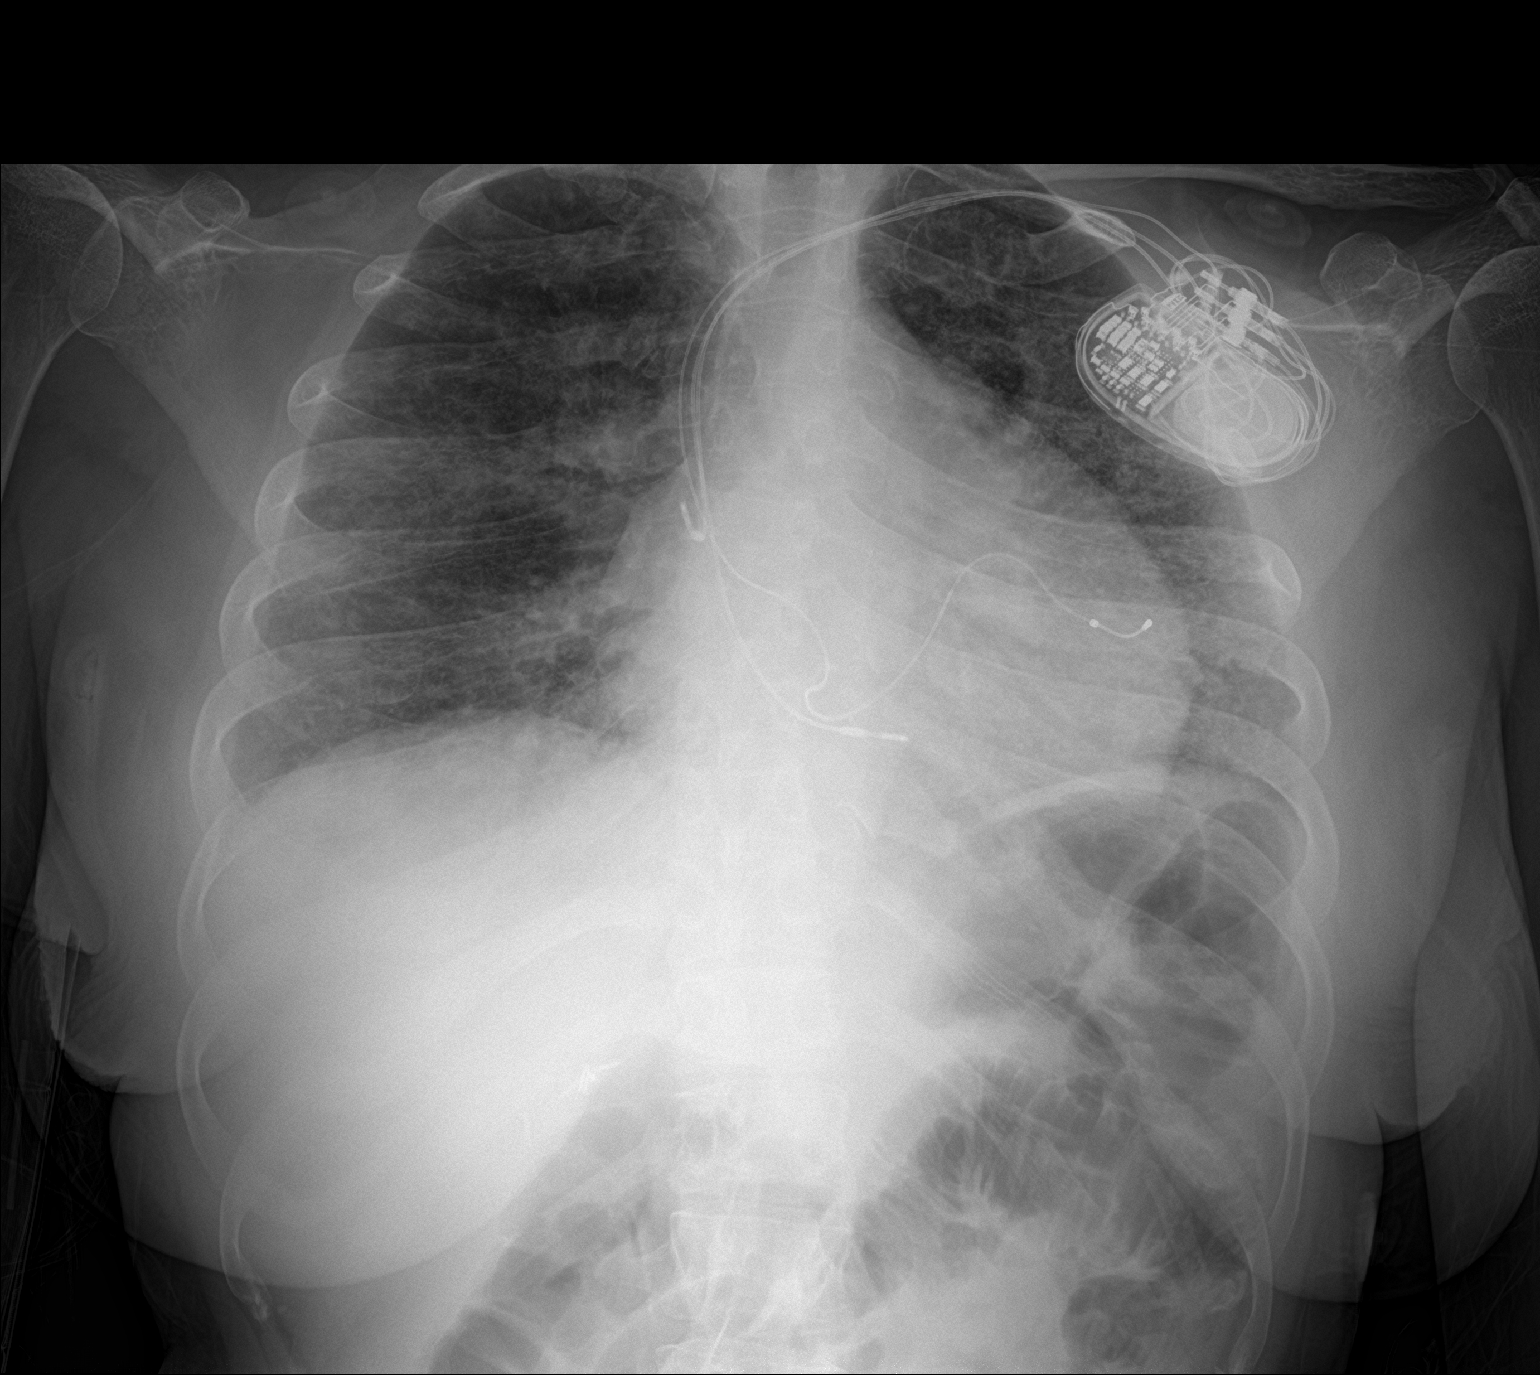

[2 of 2 positions shown; findings below may reference images not displayed]

FINDINGS: The heart is enlarged. Pacemaker leads are attached the RIGHT
atrium, RIGHT ventricle, and coronary sinus. Interstitial prominence
bilaterally is redemonstrated, slightly worse, likely edema. No
appreciable airspace consolidation or pleural effusion.
IMPRESSION: Slight worsening aeration.  Correlate clinically for CHF.

## 2019-01-04 LAB — CULTURE, BLOOD (ROUTINE X 2): Special Requests: ADEQUATE

## 2019-01-05 ENCOUNTER — Telehealth: Payer: Self-pay

## 2019-01-05 ENCOUNTER — Other Ambulatory Visit: Payer: Self-pay | Admitting: Family Medicine

## 2019-01-05 DIAGNOSIS — E669 Obesity, unspecified: Principal | ICD-10-CM

## 2019-01-05 DIAGNOSIS — E1169 Type 2 diabetes mellitus with other specified complication: Secondary | ICD-10-CM

## 2019-01-05 NOTE — Telephone Encounter (Signed)
+   BC 1/4 bottles. Likely contaminant per [employer]  Pt already called

## 2019-01-10 ENCOUNTER — Ambulatory Visit: Payer: Medicare Other | Admitting: Internal Medicine

## 2019-01-14 ENCOUNTER — Other Ambulatory Visit: Payer: Self-pay

## 2019-01-14 ENCOUNTER — Telehealth (INDEPENDENT_AMBULATORY_CARE_PROVIDER_SITE_OTHER): Payer: Medicare HMO | Admitting: Family Medicine

## 2019-01-14 DIAGNOSIS — B9789 Other viral agents as the cause of diseases classified elsewhere: Secondary | ICD-10-CM

## 2019-01-14 DIAGNOSIS — J069 Acute upper respiratory infection, unspecified: Secondary | ICD-10-CM

## 2019-01-14 MED ORDER — IPRATROPIUM BROMIDE 0.06 % NA SOLN: 2.0000 | Freq: Four times a day (QID) | NASAL | 12 refills | Status: DC

## 2019-01-14 MED ORDER — ALBUTEROL SULFATE HFA 108 (90 BASE) MCG/ACT IN AERS: INHALATION_SPRAY | RESPIRATORY_TRACT | 0 refills | Status: DC

## 2019-01-14 MED ORDER — SALINE SPRAY 0.65 % NA SOLN: 2.0000 | NASAL | 0 refills | Status: DC | PRN

## 2019-01-14 NOTE — Progress Notes (Signed)
[facility] Telemedicine Visit  Patient consented to have visit conducted via telephone.  Encounter participants: Patient: Makayla Vasquez  Provider: Garnette Gunner  Others (if applicable):   Chief Complaint: cough and runny nose  HPI:  Cough: Patient complains of chills, nasal congestion, productive cough, rhinorrhea scratchy throat and scratchy throat.  Symptoms began 1 week ago.  The cough is non-productive, with shortness of breath and wheeze during the cough and is aggravated by  walking to fast Associated symptoms include:throat pain. Patient do not have new pets. Patient does have a history of asthma. Patient does have a history of environmental allergens. Patient has not recent travel. Patient does not have a history of smoking. Patient  does not have previous Chest X-ray. Patient does not have had a PPD done. Patient uses O2 2-4 L [place]. Patient has not tried her home albuterol inhaler. Sleeps with 1 pillow at night.   ROS: No swelling in legs, No SOB, No F/C/N/V/D, no PND  Pertinent PMHx: CHF, cor pulmonale, ILD, Asthma, SLE induced lung disease, Heart burn, Chronic nasal congestion  Assessment/Plan: Patient with viral URI type symptoms with poor underlying lung function. Symptoms sound mild over the phone. Will treat symptomatically. Strict return precautions to go to ED or to call back given. Patient to call back if not improved in 1-2 days.   Time spent on phone with patient: 10 minutes

## 2019-01-17 ENCOUNTER — Inpatient Hospital Stay (HOSPITAL_COMMUNITY): Admission: RE | Admit: 2019-01-17 | Payer: Medicare HMO | Source: Ambulatory Visit

## 2019-01-27 ENCOUNTER — Other Ambulatory Visit: Payer: Self-pay | Admitting: Pulmonary Disease

## 2019-01-27 DIAGNOSIS — K219 Gastro-esophageal reflux disease without esophagitis: Secondary | ICD-10-CM

## 2019-01-28 IMAGING — CR DG CHEST 2V
2 series · 2 of 2 positions shown · non-contrast
Comparison: 12/09/2016

CLINICAL DATA: Cough for 2 weeks.

EXAM:
CHEST  2 VIEW

[w chest lat]
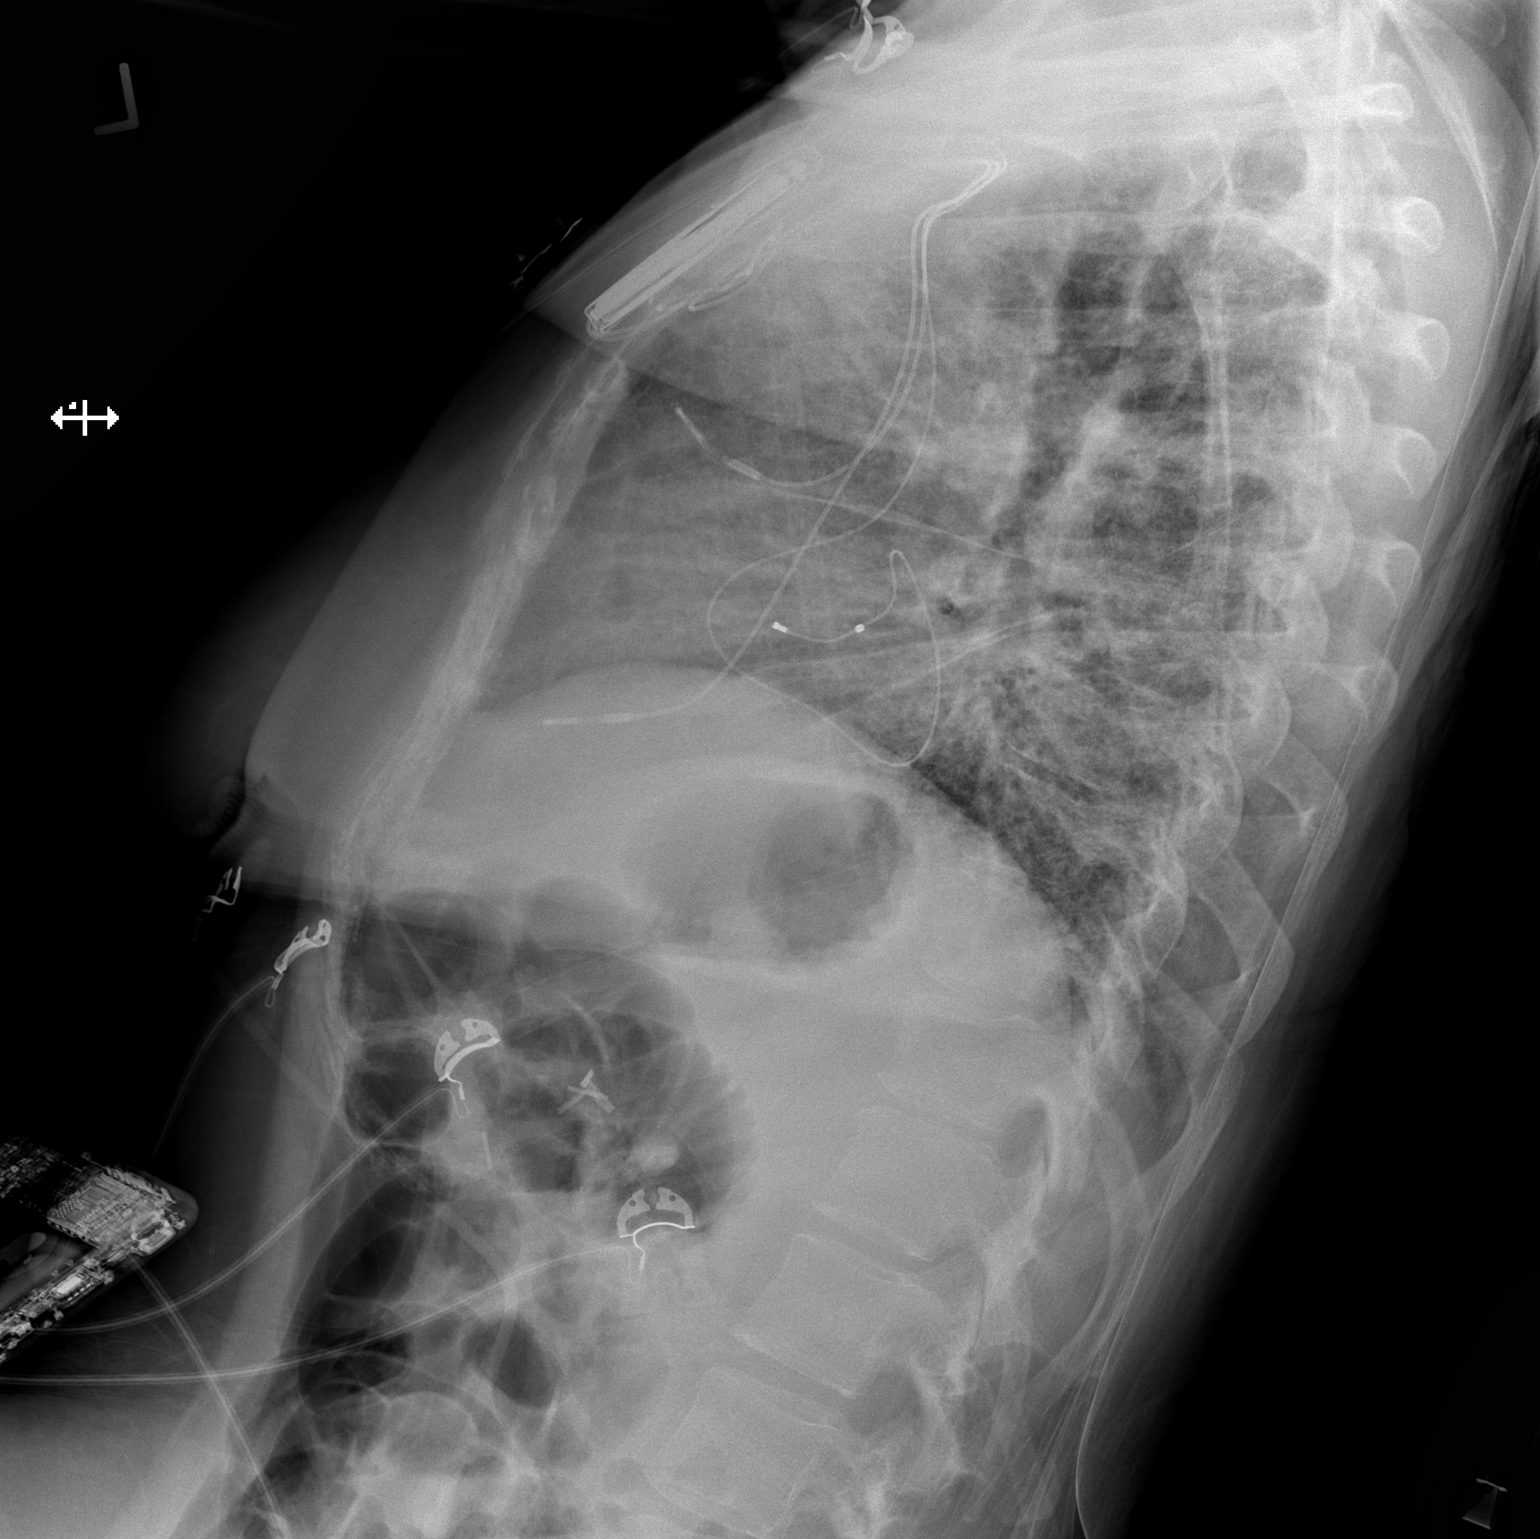

[x chest ap]
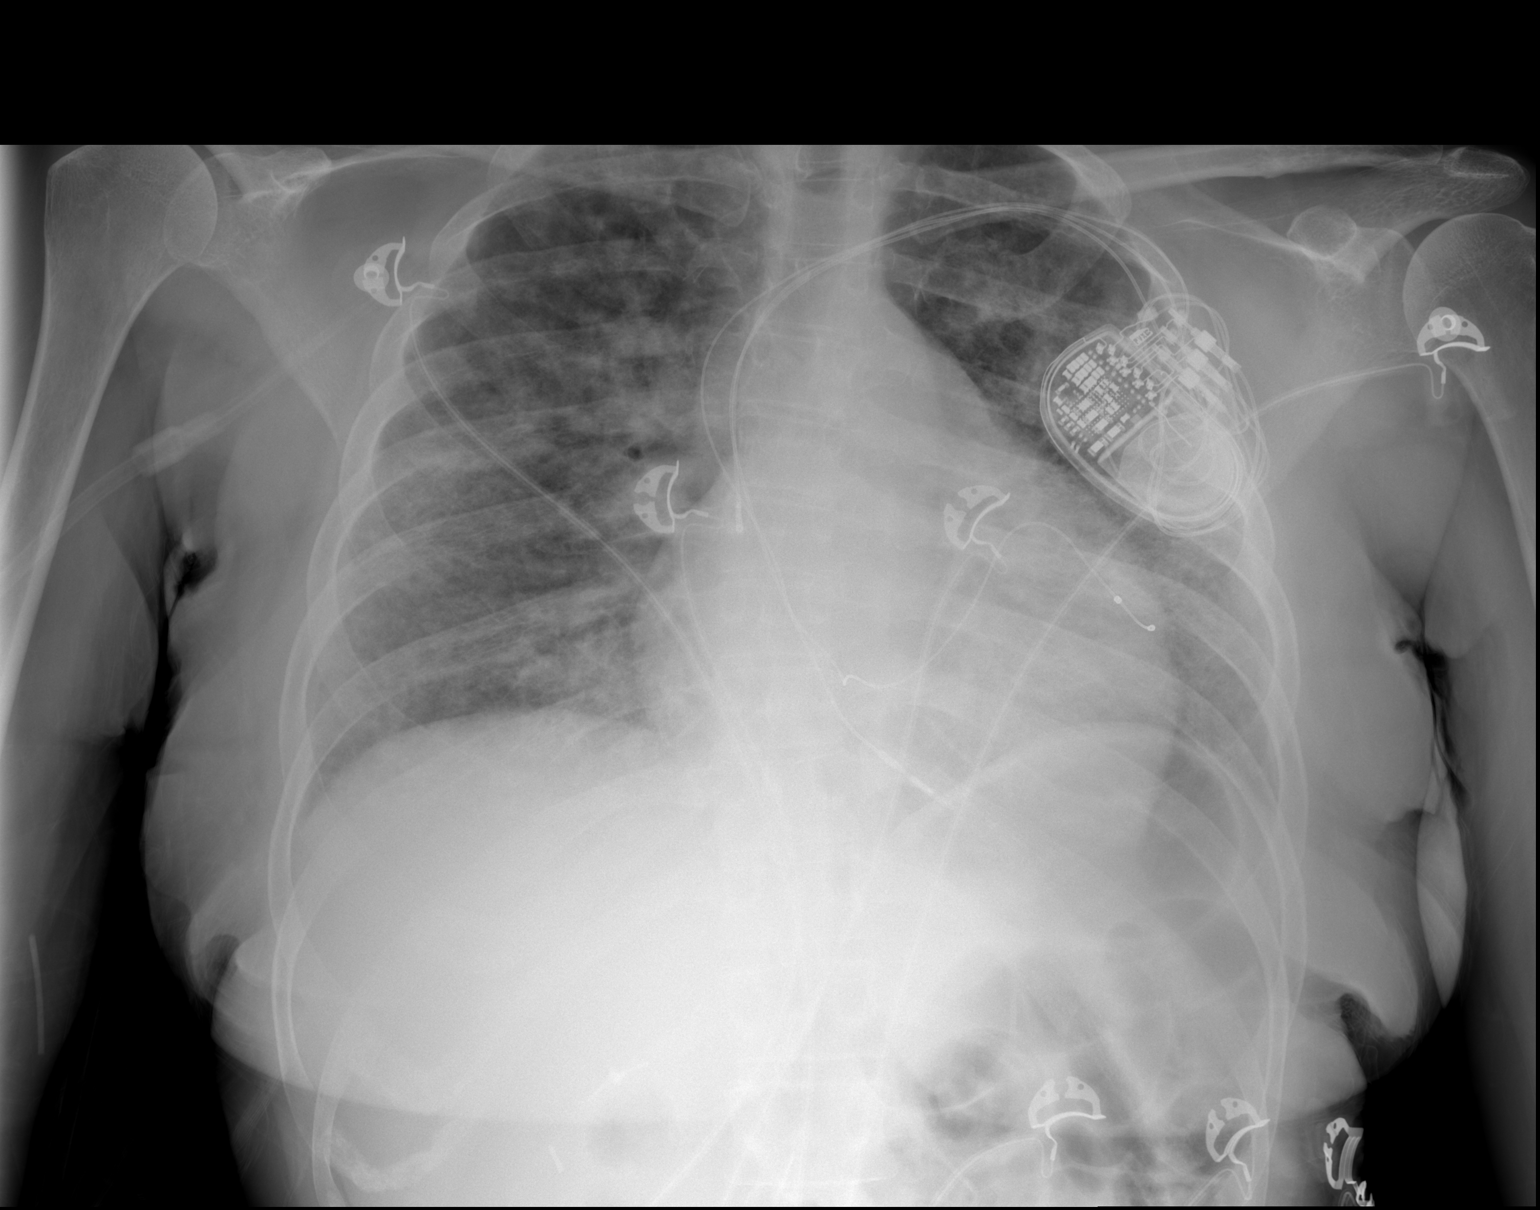

[2 of 2 positions shown; findings below may reference images not displayed]

FINDINGS: Pacemaker remains in place with unchanged appearance of right
atrium, right ventricle, and coronary sinus leads. The cardiac
silhouette remains mildly enlarged. There is increased pulmonary
vascular congestion with diffuse interstitial and patchy airspace
opacities bilaterally. No pleural effusion or pneumothorax is
identified. No acute osseous abnormality is seen. Gaseous distension
of bowel loops in the upper abdomen is incompletely evaluated.
IMPRESSION: Increasing bilateral lung opacities concerning for edema.

## 2019-01-28 IMAGING — CT CT ABD-PELV W/ CM
2 of 4 series · 15 of 46 positions shown, 17 images · IV contrast (ISOVUE)
Comparison: [DATE] [DATE], [DATE], [DATE] [DATE], [DATE] and February 25, 2013

CLINICAL DATA: Abdominal pain and vomiting for several days.

EXAM:
CT ABDOMEN AND PELVIS WITH CONTRAST
TECHNIQUE: Multidetector CT imaging of the abdomen and pelvis was performed
using the standard protocol following bolus administration of
intravenous contrast.
CONTRAST:  1 [id number] IOPAMIDOL ([id number]-[id number]) INJECTION 61%

[Series 2: abd/pel with · axial · 0.78mm/px · z∈[-512,-67]mm · 12 of 99 slices shown, 14 images]
[im 5/99  soft-tissue]
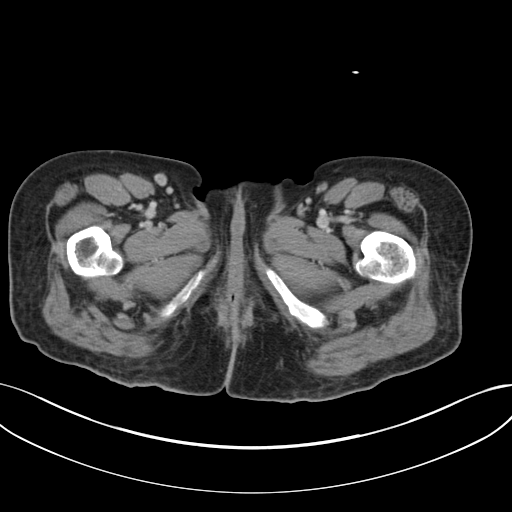
[im 5/99  bone]
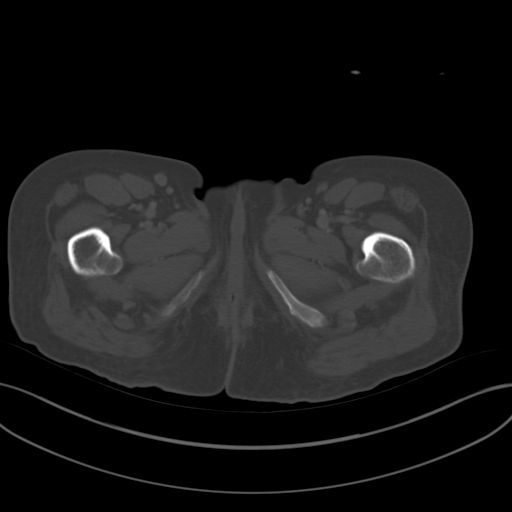
[im 15/99  soft-tissue]
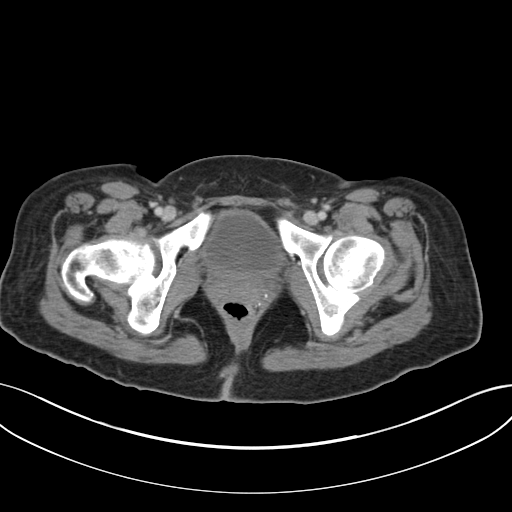
[im 20/99  soft-tissue]
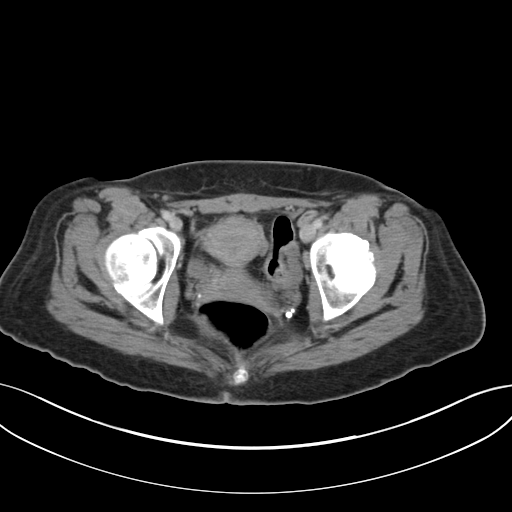
[im 30/99  soft-tissue]
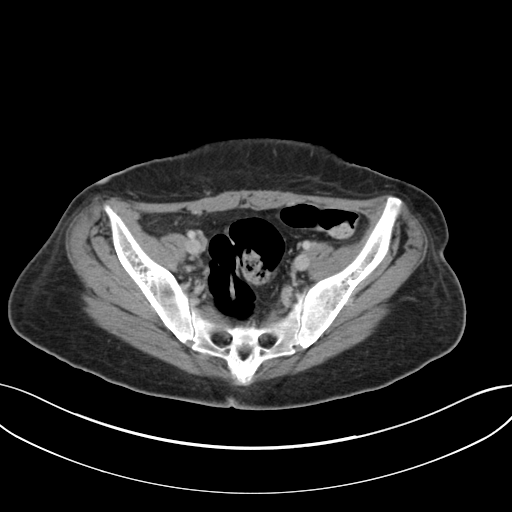
[im 40/99  soft-tissue]
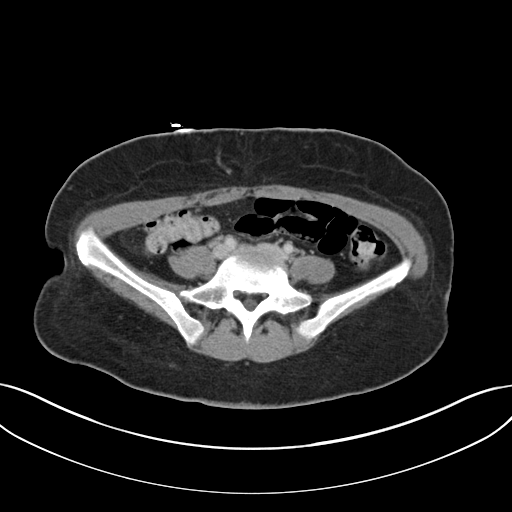
[im 45/99  soft-tissue]
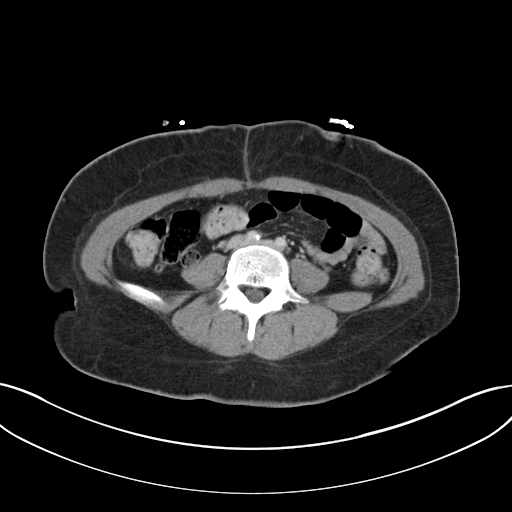
[im 54/99  soft-tissue]
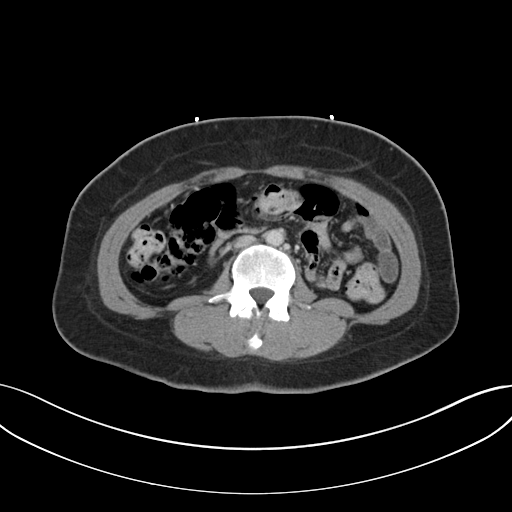
[im 59/99  soft-tissue]
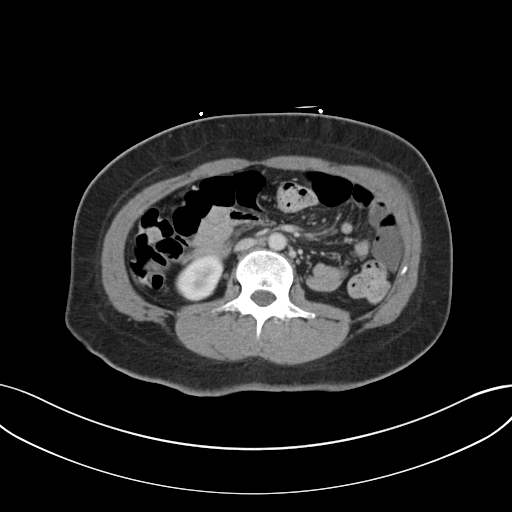
[im 69/99  soft-tissue]
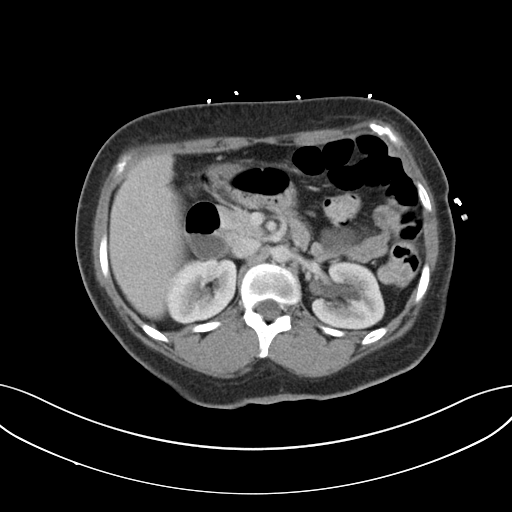
[im 69/99  bone]
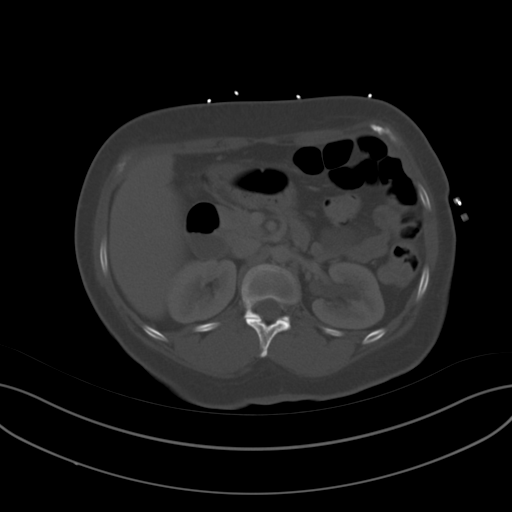
[im 79/99  soft-tissue]
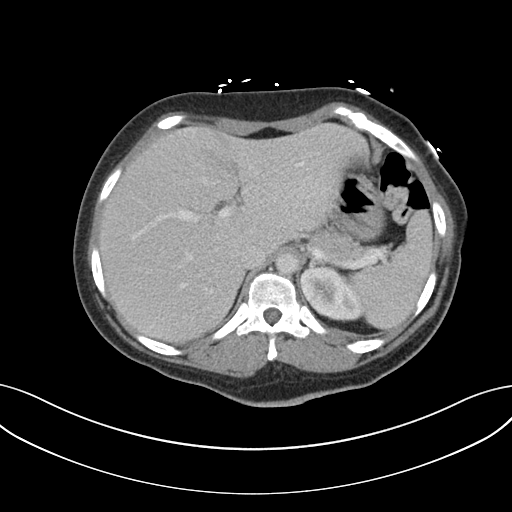
[im 84/99  soft-tissue]
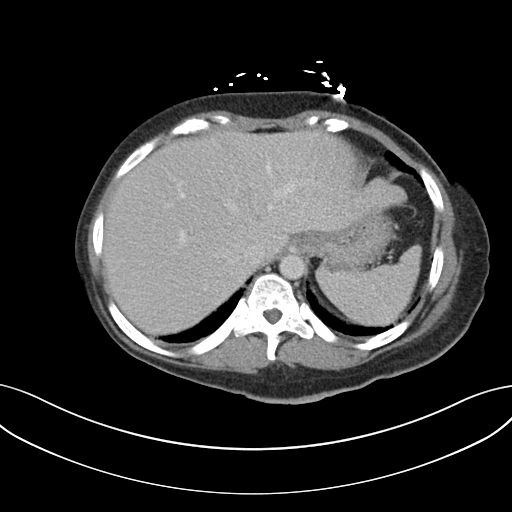
[im 94/99  soft-tissue]
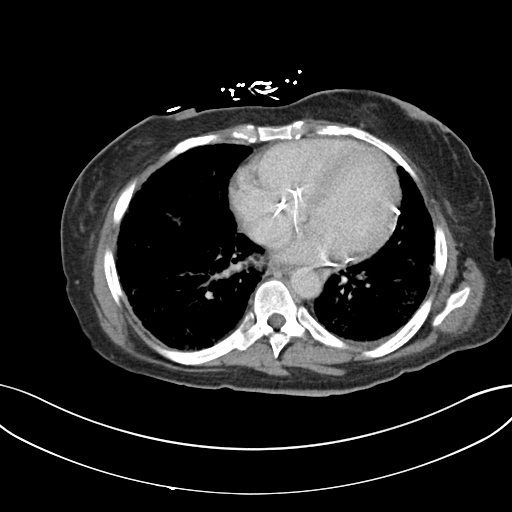

[Series 3: coronal a/|p · coronal · 0.74mm/px · 3 of 131 slices shown]
[im 44/131  soft-tissue]
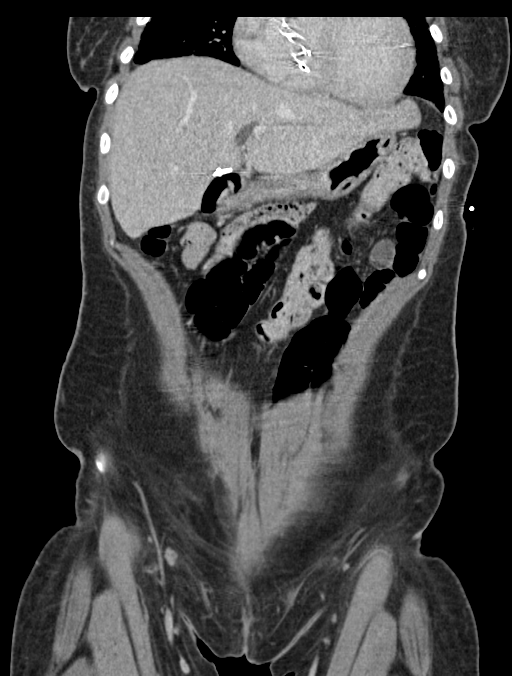
[im 58/131  soft-tissue]
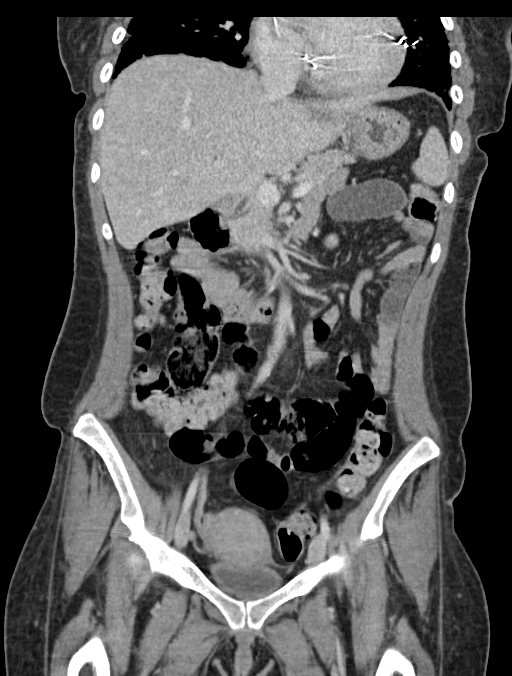
[im 73/131  soft-tissue]
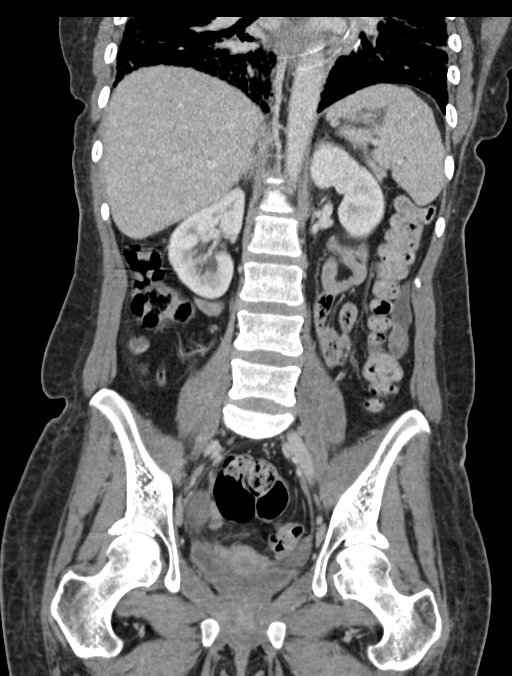

[15 of 46 positions shown; findings below may reference images not displayed]

FINDINGS: Lower chest: Chronic reticular changes in the lung bases presumably
from the patient's reported history of lupus pneumonitis. No acute
focal infiltrate. The lung bases are otherwise normal.

Hepatobiliary: Low-attenuation adjacent to the falciform ligament on
image 22 of series 2 not significantly changed since February 2013,
likely focal fatty deposition. No suspicious liver masses are
identified. The patient is status post cholecystectomy. The common
bile duct is normal. Prominence of intrahepatic bile ducts is
unchanged, likely due to previous cholecystectomy. The portal vein
is patent.

Pancreas: Unremarkable. No pancreatic ductal dilatation or
surrounding inflammatory changes.

Spleen: Normal in size without focal abnormality.

Adrenals/Urinary Tract: Mild prominence of the bilateral renal
collecting systems is stable, possibly from previous obstruction or
reflux. No acute hydronephrosis identified. No stones or suspicious
masses. No ureterectasis or ureteral stones. The bladder is normal.

Stomach/Bowel: The stomach is normal in appearance. The small bowel
is normal as well. The colon and appendix are unremarkable.

Vascular/Lymphatic: Mild atherosclerotic change in the right common
iliac artery. No other atherosclerotic change identified. The
abdominal aorta is normal in caliber. Branching vessels are
unremarkable. No adenopathy.

Reproductive: Uterus and bilateral adnexa are unremarkable.

Other: No free air or free fluid.

Musculoskeletal: Mild sclerosis in the femoral heads suggest mild
AVN. Loss of height in the central T11 vertebral body is stable. No
acute bony abnormalities.
IMPRESSION: 1. No cause for acute abdominal pain identified.
2. Chronic lupus related changes in the lung bases.
3. Minimal atherosclerotic change in the right common iliac artery.
4. Probable mild AVN in the femoral heads bilaterally, unchanged.

## 2019-01-29 ENCOUNTER — Other Ambulatory Visit: Payer: Self-pay | Admitting: Family Medicine

## 2019-01-29 DIAGNOSIS — J069 Acute upper respiratory infection, unspecified: Secondary | ICD-10-CM

## 2019-01-29 DIAGNOSIS — B9789 Other viral agents as the cause of diseases classified elsewhere: Principal | ICD-10-CM

## 2019-01-29 IMAGING — CT CT CHEST W/O CM
2 of 4 series · 15 of 36 positions shown, 18 images · non-contrast
Comparison: CT chest examinations dating back to 05/26/2014.

CLINICAL DATA: Worsening in shortness of breath and cough.

EXAM:
CT CHEST WITHOUT CONTRAST
TECHNIQUE: Multidetector CT imaging of the chest was performed following the
standard protocol without IV contrast.

[Series 2: thorax · axial · 0.77mm/px · z∈[-214,+22]mm · 12 of 138 slices shown, 15 images]
[im 10/138  mediastinal]
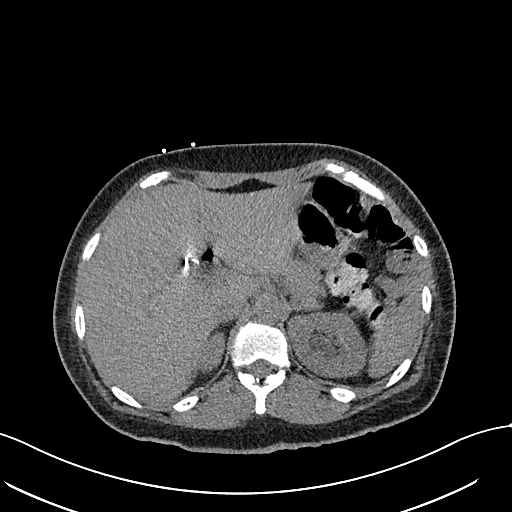
[im 10/138  lung]
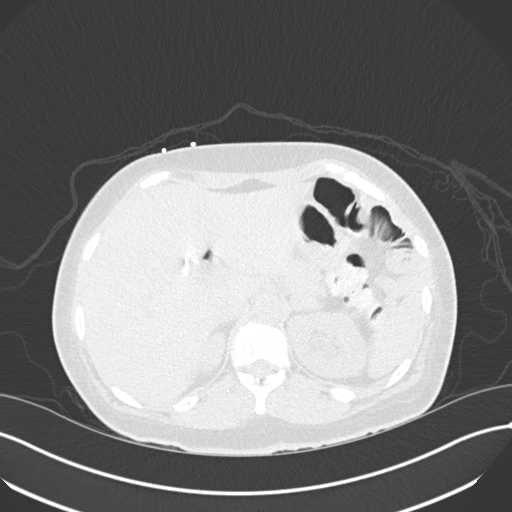
[im 20/138  lung]
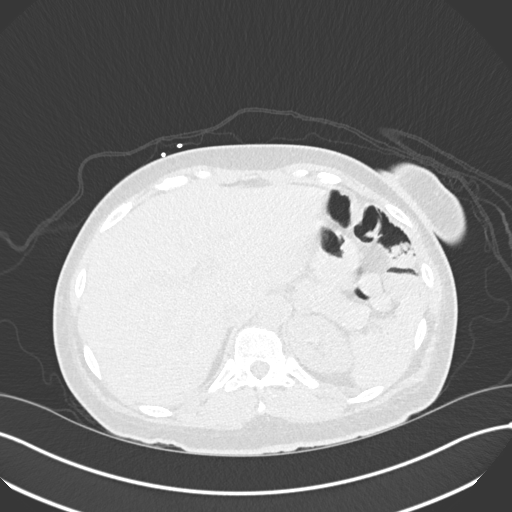
[im 30/138  lung]
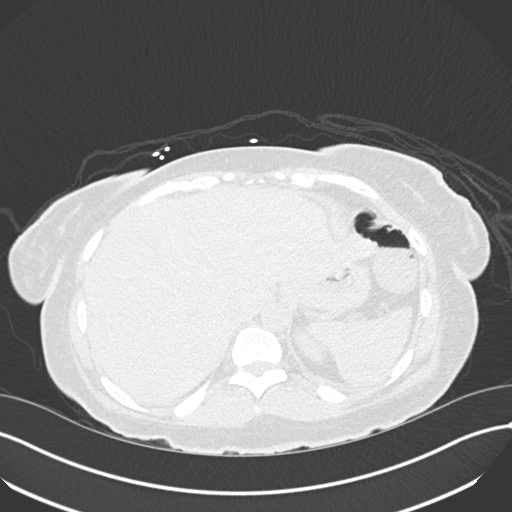
[im 40/138  lung]
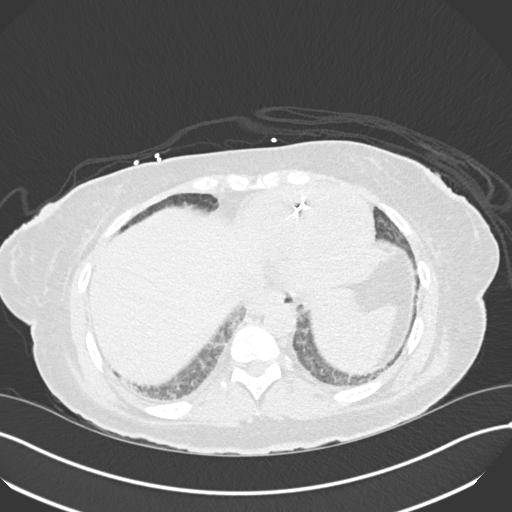
[im 49/138  mediastinal]
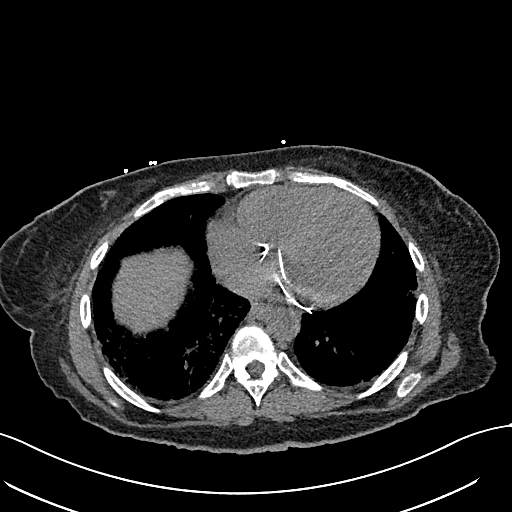
[im 49/138  lung]
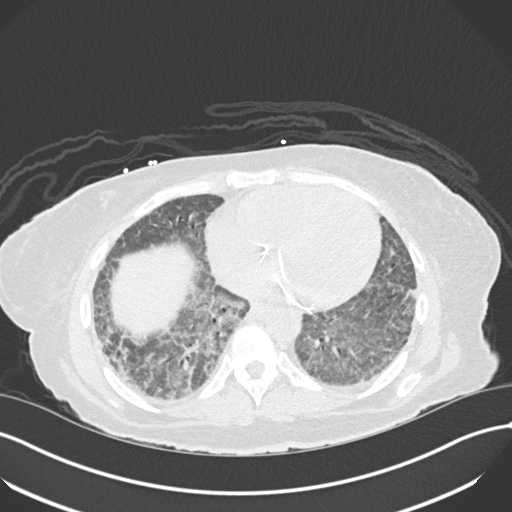
[im 59/138  lung]
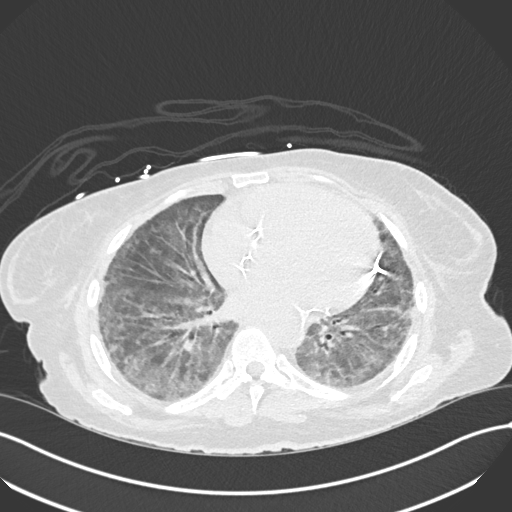
[im 79/138  lung]
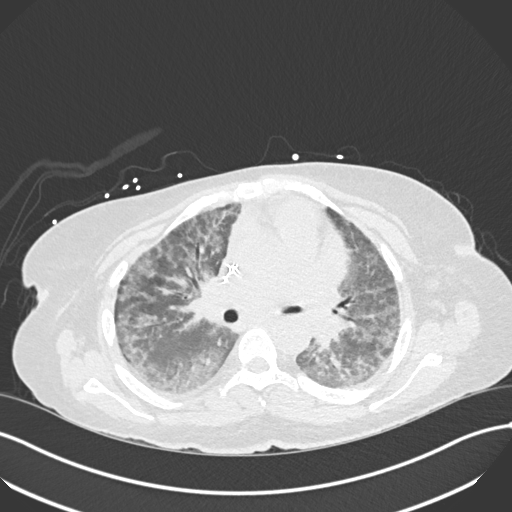
[im 89/138  lung]
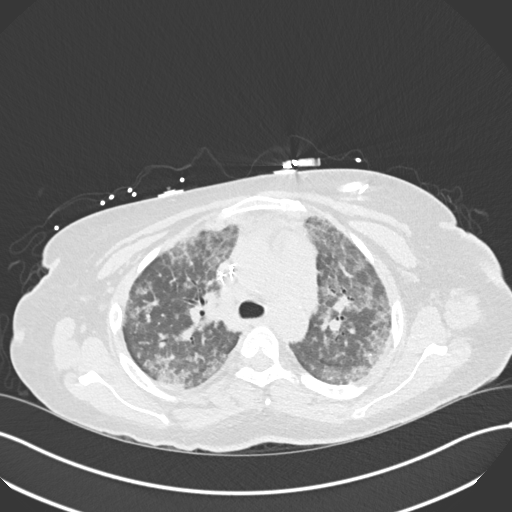
[im 98/138  mediastinal]
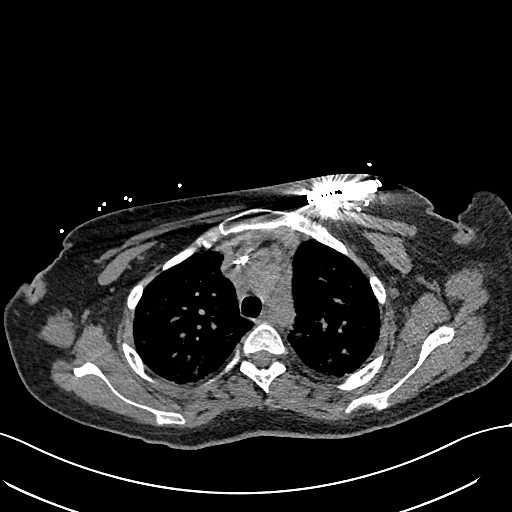
[im 98/138  lung]
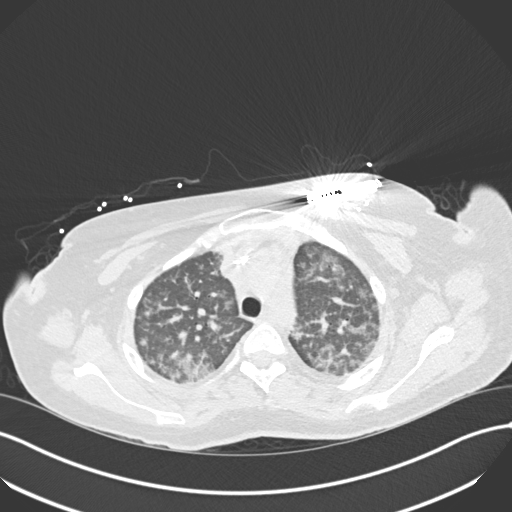
[im 108/138  lung]
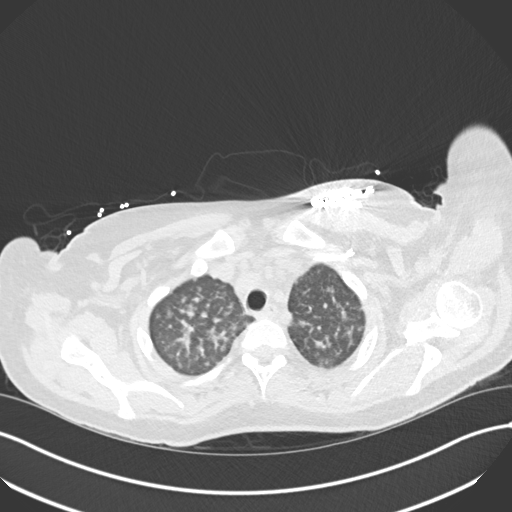
[im 118/138  lung]
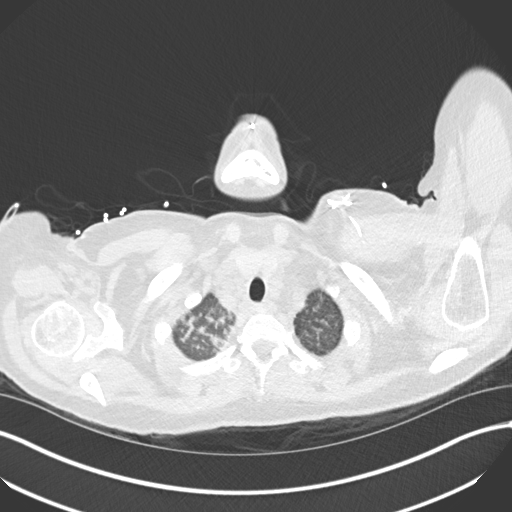
[im 128/138  lung]
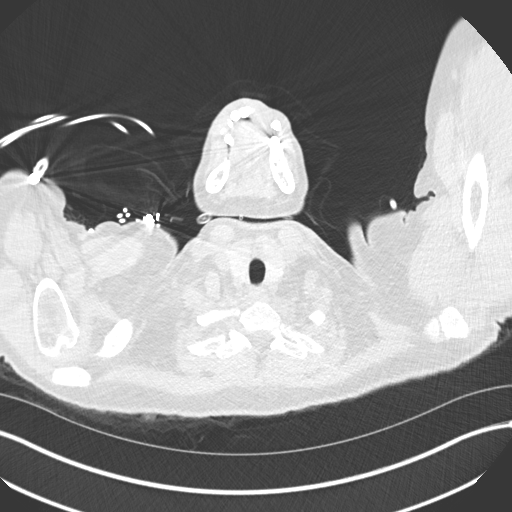

[Series 6: coronal · coronal · 0.53mm/px · 3 of 123 slices shown]
[im 25/123  lung]
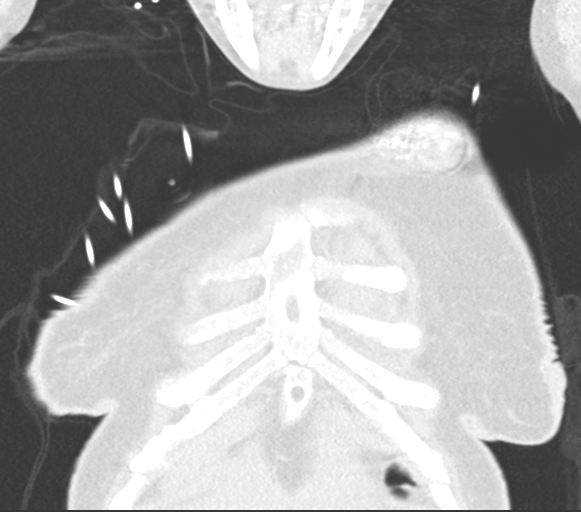
[im 49/123  lung]
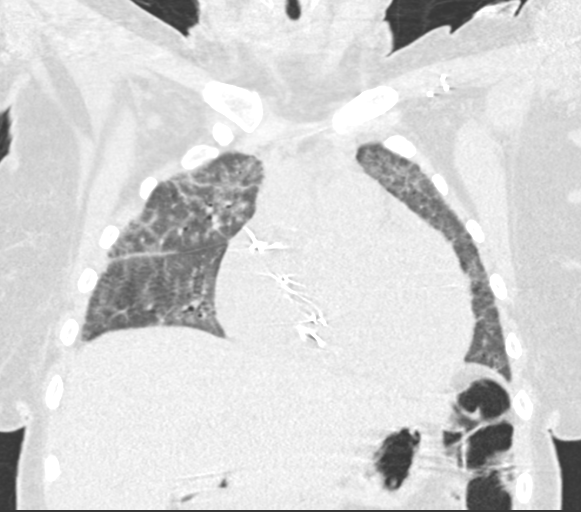
[im 74/123  lung]
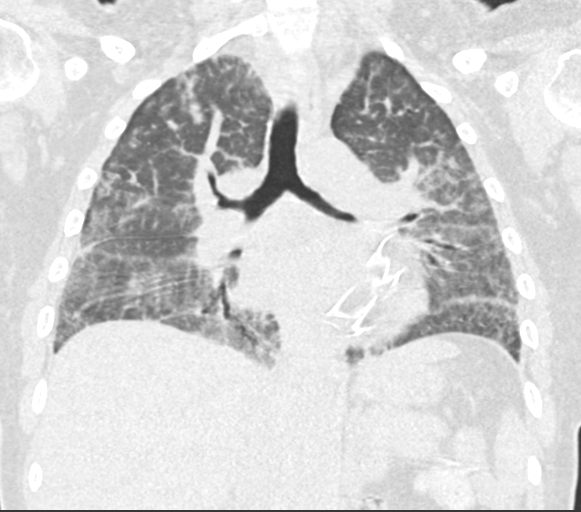

[15 of 36 positions shown; findings below may reference images not displayed]

FINDINGS: Cardiovascular: Heart is enlarged.  No pericardial effusion.

Mediastinum/Nodes: Low internal jugular lymph nodes measure up to 9
mm on the right, similar. Mediastinal lymph nodes measure up to
cm in the high right paratracheal station, stable. Hilar regions
cannot be evaluated without IV contrast. No axillary adenopathy.
Esophagus is grossly unremarkable.

Lungs/Pleura: Image quality is degraded by respiratory motion.
Fairly diffuse reticulonodular interstitial coarsening with basilar
ground-glass, similar to 02/22/2016. 6 mm nodule in the inferior
left lower lobe is unchanged from 12/10/2015. No pleural fluid.
Airway is unremarkable.

Upper Abdomen: Visualized portions of the liver, adrenal glands and
right kidney are unremarkable. There is parenchymal calcification
and possible stone on the left. Visualized portions of the spleen,
pancreas, stomach and bowel are grossly unremarkable.

Musculoskeletal: No worrisome lytic or sclerotic lesions.
IMPRESSION: 1. Pulmonary parenchymal pattern of reticulonodular coarsening and
basilar predominant ground-glass is stable from 12/10/2015 and may
represent post infectious/postinflammatory scarring. Sarcoid is not
excluded.
2. Borderline enlarged mediastinal lymph nodes, similar.
3. 6 mm left lower lobe nodule, stable for 1 year. Additional
follow-up CT chest without contrast could be performed in 1 year, as
clinically indicated. This recommendation follows the consensus
statement: Guidelines for Management of Small Pulmonary Nodules
Detected on CT Images: From the [HOSPITAL] 4260; Radiology
4. Possible left renal stone.

## 2019-01-31 ENCOUNTER — Other Ambulatory Visit (HOSPITAL_COMMUNITY): Payer: Self-pay | Admitting: Cardiology

## 2019-02-04 ENCOUNTER — Emergency Department (HOSPITAL_COMMUNITY)
Admission: EM | Admit: 2019-02-04 | Discharge: 2019-02-05 | Disposition: A | Discharge status: Home / Self Care | Payer: Medicare HMO | Attending: Emergency Medicine | Admitting: Emergency Medicine

## 2019-02-04 ENCOUNTER — Encounter (HOSPITAL_COMMUNITY): Payer: Self-pay | Admitting: *Deleted

## 2019-02-04 ENCOUNTER — Other Ambulatory Visit: Payer: Self-pay

## 2019-02-04 ENCOUNTER — Telehealth (INDEPENDENT_AMBULATORY_CARE_PROVIDER_SITE_OTHER): Payer: Medicare HMO | Admitting: Family Medicine

## 2019-02-04 ENCOUNTER — Emergency Department (HOSPITAL_COMMUNITY): Payer: Medicare HMO

## 2019-02-04 DIAGNOSIS — Z79899 Other long term (current) drug therapy: Secondary | ICD-10-CM | POA: Diagnosis not present

## 2019-02-04 DIAGNOSIS — Z794 Long term (current) use of insulin: Secondary | ICD-10-CM | POA: Diagnosis not present

## 2019-02-04 DIAGNOSIS — J302 Other seasonal allergic rhinitis: Secondary | ICD-10-CM

## 2019-02-04 DIAGNOSIS — Z950 Presence of cardiac pacemaker: Secondary | ICD-10-CM | POA: Diagnosis not present

## 2019-02-04 DIAGNOSIS — M069 Rheumatoid arthritis, unspecified: Secondary | ICD-10-CM | POA: Diagnosis not present

## 2019-02-04 DIAGNOSIS — M3210 Systemic lupus erythematosus, organ or system involvement unspecified: Secondary | ICD-10-CM | POA: Insufficient documentation

## 2019-02-04 DIAGNOSIS — J4541 Moderate persistent asthma with (acute) exacerbation: Secondary | ICD-10-CM | POA: Diagnosis not present

## 2019-02-04 DIAGNOSIS — E119 Type 2 diabetes mellitus without complications: Secondary | ICD-10-CM | POA: Diagnosis not present

## 2019-02-04 DIAGNOSIS — F319 Bipolar disorder, unspecified: Secondary | ICD-10-CM | POA: Insufficient documentation

## 2019-02-04 DIAGNOSIS — Z87891 Personal history of nicotine dependence: Secondary | ICD-10-CM | POA: Diagnosis not present

## 2019-02-04 DIAGNOSIS — R05 Cough: Secondary | ICD-10-CM | POA: Insufficient documentation

## 2019-02-04 DIAGNOSIS — R059 Cough, unspecified: Secondary | ICD-10-CM

## 2019-02-04 DIAGNOSIS — R0602 Shortness of breath: Secondary | ICD-10-CM | POA: Diagnosis present

## 2019-02-04 DIAGNOSIS — I509 Heart failure, unspecified: Secondary | ICD-10-CM | POA: Insufficient documentation

## 2019-02-04 DIAGNOSIS — Z20828 Contact with and (suspected) exposure to other viral communicable diseases: Secondary | ICD-10-CM | POA: Insufficient documentation

## 2019-02-04 LAB — CBC
HCT: 34 % — ABNORMAL LOW (ref 36.0–46.0)
Hemoglobin: 9.9 g/dL — ABNORMAL LOW (ref 12.0–15.0)
MCH: 23.6 pg — ABNORMAL LOW (ref 26.0–34.0)
MCHC: 29.1 g/dL — ABNORMAL LOW (ref 30.0–36.0)
MCV: 81.1 fL (ref 80.0–100.0)
Platelets: 331 10*3/uL (ref 150–400)
RBC: 4.19 MIL/uL (ref 3.87–5.11)
RDW: 16.6 % — ABNORMAL HIGH (ref 11.5–15.5)
WBC: 6.7 10*3/uL (ref 4.0–10.5)
nRBC: 0 % (ref 0.0–0.2)

## 2019-02-04 LAB — BASIC METABOLIC PANEL
Anion gap: 12 (ref 5–15)
BUN: 14 mg/dL (ref 6–20)
CO2: 21 mmol/L — ABNORMAL LOW (ref 22–32)
Calcium: 8.3 mg/dL — ABNORMAL LOW (ref 8.9–10.3)
Chloride: 107 mmol/L (ref 98–111)
Creatinine, Ser: 0.87 mg/dL (ref 0.44–1.00)
GFR calc Af Amer: >60 mL/min (ref >60)
GFR calc non Af Amer: >60 mL/min (ref >60)
Glucose, Bld: 160 mg/dL — ABNORMAL HIGH (ref 70–99)
Potassium: 4.1 mmol/L (ref 3.5–5.1)
Sodium: 140 mmol/L (ref 135–145)

## 2019-02-04 LAB — FERRITIN: Ferritin: 23 ng/mL (ref 11–307)

## 2019-02-04 LAB — C-REACTIVE PROTEIN: CRP: 1.7 mg/dL — ABNORMAL HIGH (ref <1.0)

## 2019-02-04 LAB — PROCALCITONIN: Procalcitonin: <0.1 ng/mL

## 2019-02-04 LAB — LACTATE DEHYDROGENASE: LDH: 315 U/L — ABNORMAL HIGH (ref 98–192)

## 2019-02-04 LAB — I-STAT BETA HCG BLOOD, ED (MC, WL, AP ONLY): I-stat hCG, quantitative: <5 m[IU]/mL (ref <5)

## 2019-02-04 LAB — TRIGLYCERIDES: Triglycerides: 183 mg/dL — ABNORMAL HIGH (ref <150)

## 2019-02-04 MED ORDER — FLUTICASONE PROPIONATE 50 MCG/ACT NA SUSP: 2.0000 | Freq: Every day | NASAL | 6 refills | Status: DC

## 2019-02-04 MED ORDER — DOXYCYCLINE HYCLATE 100 MG PO TABS: 100.0000 mg | ORAL_TABLET | Freq: Two times a day (BID) | ORAL | 0 refills | Status: DC

## 2019-02-04 NOTE — ED Triage Notes (Signed)
Pt c/o cough, nasal drainage, chills, headache, and shortness of breath x 1 week. No fevers. Pt wears 2-4 liters at home oxygen. No acute distress.

## 2019-02-04 NOTE — Progress Notes (Signed)
[facility] Telemedicine Visit  Patient consented to have virtual visit. Method of visit: Telephone  Encounter participants: Patient: Makayla Vasquez - located at home Provider: Renold Don - located at office Others (if applicable): n/a   Chief Complaint: cough and sinus symptoms  HPI:  Patient has been complaining of cough as well as sinus drainage and congestion for about the past 1/2 weeks.  Started initially as cough but the very next day began noticing sinus congestion, drainage, itchy throat.  She reports that she has what she describes as "allergic asthma" that occurs this time every year when the pollen comes out.  She has not tried albuterol.  She has been using nasal saline over-the-counter spray without much relief.  No fevers or chills.  No abdominal pain.  No nausea or vomiting.  No undue fatigue.  Does use O2 chronically and has been using 4 L.  Her baseline is 2 to 4 L.  She had no lower extremity edema.  Cough is dry nonproductive.  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  Exam: Gen:  Patient awake, alert, fully conversant, oriented x 4 Auditory:  Hearing normal Resp:  Good strong voice.  Speaking in full sentences.  Did have one prolonged episode of coughing during exam.  No respiratory distress.  No audible wheezing over the phone  Psych:  Linear and coherent thought process.  No flight of ideas.    Assessment/Plan:  1. Asthma exacerbation: -Plan to treat symptoms multifactorially. -She is to start using her albuterol inhaler which she has not tried yet despite her symptoms. -Use this every 4-6 hours through the weekend. -She has fairly persistent dry cough as evident during exam today.  However she is had no fevers or chills or other indicators of coronavirus. Cough non-productive.  No LE  edema or weight gain suggestive of CHF.  -  Symptoms been going on for a week and a half and I do not suspect any acute/sudden worsening.  She states that her symptoms have been about the same for that same period of time. -Plan to treat as asthma exacerbation.  Holding the prednisone.  Will use Flonase for sinus symptoms as well as doxycycline to cover any sinus or respiratory issues. -Follow-up with [place] Monday by phone or in person if she does not have any further improvement. -Patient agreed with plan and appreciative of call.  Time spent on phone with patient: 21 minutes

## 2019-02-04 NOTE — ED Provider Notes (Signed)
Medical screening examination/treatment/procedure(s) were conducted as a shared visit with non-physician practitioner(s) and myself.  I personally evaluated the patient during the encounter.  Clinical Impression:   Final diagnoses:  Shortness of breath  Cough    The patient is a 38 year old female, she has a known history of lupus, she is also a diabetic and taking immunosuppressant medications.  She presents with a complaint of chills, shortness of breath which is been going on for 2 weeks but slightly worsening and a complaint of a slightly sore throat.  On my exam she has clear heart and lung sounds, some slight rales but these clear with deep breathing.  She is in no distress and speaks in full sentences.  In fact she is on the phone when I come into the room with the family member and is speaking in full sentences without any problems.  She has no edema of her legs, no tachycardia on my exam though she was slightly tachycardic on arrival.  The patient is usually on 2 to 4 L at home on oxygen.  The patient's risk for coronavirus is higher, will also make sure she does not have a blood clot given her hypercoagulable state.  ED ECG REPORT  I personally interpreted this EKG   Date: 02/07/2019   Rate: 96  Rhythm: atrial sensed ventricular paced  QRS Axis: indeterminate  Intervals: wide QRS  ST/T Wave abnormalities: nonspecific ST changes  Conduction Disutrbances:paced QRS  Narrative Interpretation:   Old EKG Reviewed: unchanged    Eber Hong, MD 02/07/19 [id number]

## 2019-02-04 NOTE — ED Provider Notes (Signed)
[facility] EMERGENCY DEPARTMENT Provider Note   CSN: [id number] Arrival date & time: 02/04/19  1940    History   Chief Complaint Chief Complaint  Patient presents with  . Shortness of Breath    HPI Makayla Vasquez is a 38 y.o. female with PMHx RA, CHF, congenital complete AV block with pacemaker, and lupus who presents to the ED complaining of dry cough and shortness of breath x 2 weeks, worsening over the last 3 days. Pt also complains of chills, nasal drainage, and a sore throat x 2 weeks. No contact with COVID 19 positive patient. No recent travel. Pt states she has been trying to stay home as much as possible during this pandemic but has had visitors come in and out of her home, mostly family. She has been evaluated twice via telemedicine over the past 2 weeks; supportive care was suggesting during first encounter and second encounter suggested use of albuterol inhaler and flonase/doxycycline to cover for any sinus or respiratory issues. Pt reports her rheumatologist does not like her to be on antibiotics so she did not take the doxy. Denies fever, chest pain, abdominal pain, or any other associated symptoms.        Past Medical History:  Diagnosis Date  . Anxiety   . Arthritis    rheumatoid arthritis- mild, no rheumatology care   . Asthma    seasonal allergies   . Asymptomatic LV dysfunction    a. Echo in Dec 2011 with EF 35 to 40%. Felt to be due to paced rhythm. b. EF 25-30% in 07/2014.  . Bipolar affective disorder (HCC)   . Cardiac pacemaker    a. Since age 85 in 13. b. Upgrade to BiV in 2013.  [name] Carpal tunnel syndrome of right wrist   . CHF (congestive heart failure) (HCC)   . Congenital complete AV block   . Cor pulmonale, chronic (HCC) 04/08/2018  . Depression    bipolar  . Diabetes mellitus without complication (HCC)   . GERD (gastroesophageal reflux disease)   . Hypertension   . Interstitial lung disease (HCC)   . Lupus (HCC)   . Lupus  (systemic lupus erythematosus) (HCC)   . Obesity   . Pneumonitis    a. a/w hypoxia - inflammatory - large workup 07/2014.  . Presence of permanent cardiac pacemaker   . Seizures (HCC)    as a child- from high fever  . Sinus tachycardia     Patient Active Problem List   Diagnosis Date Noted  . Chronic nasal congestion 10/29/2018  . CAP (community acquired pneumonia) 10/24/2018  . Immunosuppression (HCC) 10/24/2018  . 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 Diabetes mellitus due to therapeutic use of corticosteroid (HCC) 10/24/2018  . Right wrist pain 07/24/2018  . Anxiety state 07/21/2018  . Upper airway cough syndrome 06/18/2018  . Cor pulmonale, chronic (HCC) 04/08/2018  . Abnormal uterine bleeding 02/14/2018  . Dyspareunia, female 05/14/2017  . Sepsis without acute organ dysfunction (HCC) 01/05/2017  . 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 On Cellcept therapy   . Lupus (HCC)   . Fever   . Long term (current) use of systemic steroids   . Chronic diastolic congestive heart failure (HCC)   . Interstitial lung disease (HCC)   . Anemia   . Bipolar affective disorder in remission (HCC)   . Acute on chronic congestive heart failure (HCC)   . 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  Adrenal insufficiency (HCC)   . Antisynthetase syndrome (HCC)   . Idiopathic hypotension 07/08/2015  . Hyperlipidemia associated with type 2 diabetes mellitus (HCC) 05/04/2015  . Diabetes mellitus type 2 in obese (HCC)   . Chronic respiratory failure with hypoxia (HCC) 08/07/2014  . Acute respiratory failure with hypoxia (HCC) 06/19/2014  . Congestive dilated cardiomyopathy (HCC) 05/31/2014  . Chronic systolic heart failure (HCC) 02/26/2012  . PPM-Medtronic   . Congenital complete AV block   . GERD (gastroesophageal reflux disease)   . 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 Polycystic ovaries 12/17/2006    Past Surgical History:   Procedure Laterality Date  . ATRIAL TACH ABLATION N/A 08/14/2014   Procedure: ATRIAL TACH ABLATION;  Surgeon: Marinus MawGregg W Taylor, MD;  [facility];  Service: Cardiovascular;  Laterality: N/A;  . BI-VENTRICULAR PACEMAKER UPGRADE N/A 03/08/2012   Procedure: BI-VENTRICULAR PACEMAKER UPGRADE;  Surgeon: Marinus MawGregg W Taylor, MD;  [facility];  Service: Cardiovascular;  Laterality: N/A;  . CARPAL TUNNEL WITH CUBITAL TUNNEL Right 07/26/2013   Procedure: RIGHT LIMITED OPEN CARPAL TUNNEL RELEASE ,  RIGHT CUBITAL TUNNEL RELEASE, INSITU VERSES ULNAR NERVE DECOMPRESSION AND ANTERIOR TRANSPOSITION;  Surgeon: Dominica SeverinWilliam Gramig, MD;  Location: [facility] OR;  Service: Orthopedics;  Laterality: Right;  . CESAREAN SECTION    . CHOLECYSTECTOMY    . CYST EXCISION  12/10/2012   THYROID  . INSERT / REPLACE / REMOVE PACEMAKER     2001  . IUD REMOVAL  11/03/2011   Procedure: INTRAUTERINE DEVICE (IUD) REMOVAL;  Surgeon: Myra C. Marice Potterove, MD;  Location: [facility] ORS;  Service: Gynecology;  Laterality: N/A;  . NASAL FRACTURE SURGERY     /w plate   . RIGHT HEART CATHETERIZATION N/A 10/26/2014   Procedure: RIGHT HEART CATH;  Surgeon: Dolores Pattyaniel R Bensimhon, MD;  [facility];  Service: Cardiovascular;  Laterality: N/A;  . THROAT SURGERY  1994   s/p laser treatment  . THYROGLOSSAL DUCT CYST N/A 12/10/2012   Procedure: REVISION OF THYROGLOSSAL DUCT CYST EXCISION;  Surgeon: Serena ColonelJefry Rosen, MD;  [facility];  Service: ENT;  Laterality: N/A;  Revision of Thyroglossal Duct Cyst Excision  . VIDEO BRONCHOSCOPY Bilateral 06/19/2014   Procedure: VIDEO BRONCHOSCOPY WITHOUT FLUORO;  Surgeon: Kalman ShanMurali Ramaswamy, MD;  [facility];  Service: Cardiopulmonary;  Laterality: Bilateral;     OB History    Gravida  1   Para  1   Term  1   Preterm      AB      Living  1     SAB      TAB      Ectopic      Multiple      Live Births               Home Medications    Prior to Admission medications   Medication Sig  Start Date End Date Taking? Authorizing Provider  albuterol (PROVENTIL HFA;VENTOLIN HFA) 108 (90 Base) MCG/ACT inhaler INHALE 2 PUFFS BY MOUTH EVERY 4 HOURS AS NEEDED FOR WHEEZE OR FOR SHORTNESS OF BREATH Patient taking differently: Inhale 2 puffs into the lungs every 4 (four) hours as needed for wheezing or shortness of breath.  01/31/19  Yes Wendee BeaversMcMullen, David J, DO  bisoprolol (ZEBETA) 5 MG tablet Take 1 tablet (5 mg total) by mouth daily. Patient taking differently: Take 5 mg by mouth at bedtime.  12/06/18  Yes Laurey MoraleMcLean, Dalton S, MD  dextromethorphan-guaiFENesin (ROBITUSSIN-DM) 10-100 MG/5ML liquid TAKE 5 MLS BY  MOUTH EVERY 4 (FOUR) HOURS AS NEEDED FOR COUGH. Patient taking differently: Take 5 mLs by mouth every 4 (four) hours as needed for cough.  10/27/18  Yes Wendee Beavers, DO  Etonogestrel (NEXPLANON [place]) Inject 1 Device into the skin once. Every 3 years   Yes [provider]  furosemide (LASIX) 40 MG tablet TAKE 1 TABLET BY MOUTH ONCE DAILY Patient taking differently: Take 40 mg by mouth daily.  07/29/18  Yes Laurey Morale, MD  ipratropium (ATROVENT) 0.06 % nasal spray Place 2 sprays into both nostrils 4 (four) times daily. Patient taking differently: Place 2 sprays into both nostrils 4 (four) times daily as needed for rhinitis.  01/14/19  Yes Garnette Gunner, MD  LANTUS SOLOSTAR 100 UNIT/ML Solostar Pen INJECT 15 UNITS SUBCUTANEOUSLY EVERY DAY Patient taking differently: Inject 15 Units into the skin daily.  01/05/19  Yes Wendee Beavers, DO  metFORMIN (GLUCOPHAGE) 1000 MG tablet Take 1 tablet (1,000 mg total) by mouth 2 (two) times daily with a meal. 10/22/18  Yes Wendee Beavers, DO  mycophenolate (CELLCEPT) 500 MG tablet Take 1,000 mg by mouth 2 (two) times daily.    Yes [provider]  NOVOLOG FLEXPEN 100 UNIT/ML FlexPen INJECT 8 UNITS INTO THE SKIN 3 TIMES DAILY WITH MEALS. Patient taking differently: Inject 8 Units into the skin 3 (three) times daily with meals.   11/02/18  Yes Wendee Beavers, DO  omeprazole (PRILOSEC) 20 MG capsule TAKE 1 CAPSULE BY MOUTH EVERY DAY Patient taking differently: Take 20 mg by mouth daily before breakfast.  02/01/19  Yes Young, Clinton D, MD  OXYGEN Inhale 2-4 L into the lungs continuous.    Yes [provider]  potassium chloride SA (K-DUR,KLOR-CON) 20 MEQ tablet Take 1 tablet (20 mEq total) by mouth daily. 02/01/19  Yes Bensimhon, Bevelyn Buckles, MD  predniSONE (DELTASONE) 10 MG tablet Take 10 mg by mouth daily with breakfast.   Yes [provider]  sodium chloride (OCEAN) 0.65 % SOLN nasal spray Place 2 sprays into both nostrils as needed for congestion. 01/14/19  Yes Garnette Gunner, MD  tacrolimus (PROGRAF) 0.5 MG capsule Take 1.5 mg by mouth 2 (two) times daily. 11/27/17  Yes [provider]  traZODone (DESYREL) 50 MG tablet TAKE 1 TABLET (50 MG TOTAL) BY MOUTH AT BEDTIME AS NEEDED FOR SLEEP. 09/27/18  Yes Wendee Beavers, DO  Azelastine-Fluticasone (DYMISTA) 137-50 MCG/ACT SUSP Place 2 sprays into both nostrils 1 day or 1 dose. Patient not taking: Reported on 02/04/2019 12/01/18   Marcelyn Bruins, MD  benzonatate (TESSALON) 100 MG capsule Take 1 capsule (100 mg total) by mouth 2 (two) times daily as needed for cough. Patient not taking: Reported on 02/04/2019 11/23/18   Arlyce Harman, DO  doxycycline (VIBRA-TABS) 100 MG tablet Take 1 tablet (100 mg total) by mouth 2 (two) times daily. 02/04/19   Tobey Grim, MD  fluticasone (FLONASE) 50 MCG/ACT nasal spray Place 2 sprays into both nostrils daily. Patient not taking: Reported on 02/04/2019 02/04/19   Tobey Grim, MD    Family History Family History  Problem Relation Age of Onset  . Heart disease Mother        CHF (no details)  . Hypertension Mother   . Lupus Mother   . Heart disease Father        Murmur  . Heart disease Sister 63        No details.  History of a pacemaker  Social History Social History   Tobacco Use   . Smoking status: Former Smoker    Packs/day: 0.25    Years: 0.50    Pack years: 0.12    Types: Cigarettes    Last attempt to quit: 07/25/1996    Years since quitting: 22.5  . Smokeless tobacco: Never Used  Substance Use Topics  . Alcohol use: No  . Drug use: No     Allergies   Atorvastatin; Sertraline hcl; and Tape   Review of Systems Review of Systems  Constitutional: Positive for chills. Negative for fever.  HENT: Positive for rhinorrhea and sore throat. Negative for trouble swallowing and voice change.   Eyes: Negative for redness.  Respiratory: Positive for cough and shortness of breath.   Cardiovascular: Negative for chest pain.  Gastrointestinal: Negative for abdominal pain, nausea and vomiting.  Genitourinary: Negative for dysuria and frequency.  Musculoskeletal: Negative for myalgias.  Skin: Negative for rash.  Allergic/Immunologic: Positive for immunocompromised state.     Physical Exam Updated Vital Signs BP 120/85   Pulse 88   Temp 98.1 F (36.7 C) (Oral)   Resp 17   Ht 5\' 5"  (1.651 m)   Wt 66.7 kg   SpO2 99%   BMI 24.46 kg/m   Physical Exam Vitals signs and nursing note reviewed.  Constitutional:      Appearance: She is not ill-appearing or diaphoretic.  HENT:     Head: Normocephalic and atraumatic.     Mouth/Throat:     Mouth: Mucous membranes are moist.     Pharynx: No pharyngeal swelling or oropharyngeal exudate.  Eyes:     Conjunctiva/sclera: Conjunctivae normal.  Neck:     Musculoskeletal: Neck supple.  Cardiovascular:     Rate and Rhythm: Normal rate and regular rhythm.     Comments: Pacemaker Pulmonary:     Effort: Pulmonary effort is normal. No tachypnea or respiratory distress.     Breath sounds: Normal breath sounds. No decreased breath sounds, wheezing, rhonchi or rales.  Chest:     Chest wall: No tenderness.  Abdominal:     Palpations: Abdomen is soft.     Tenderness: There is no abdominal tenderness.  Musculoskeletal:      Right lower leg: No edema.     Left lower leg: No edema.  Skin:    General: Skin is warm and dry.  Neurological:     Mental Status: She is alert.      ED Treatments / Results  Labs (all labs ordered are listed, but only abnormal results are displayed) Labs Reviewed  BASIC METABOLIC PANEL - Abnormal; Notable for the following components:      Result Value   CO2 21 (*)    Glucose, Bld 160 (*)    Calcium 8.3 (*)    All other components within normal limits  CBC - Abnormal; Notable for the following components:   Hemoglobin 9.9 (*)    HCT 34.0 (*)    MCH 23.6 (*)    MCHC 29.1 (*)    RDW 16.6 (*)    All other components within normal limits  LACTATE DEHYDROGENASE - Abnormal; Notable for the following components:   LDH 315 (*)    All other components within normal limits  SARS CORONAVIRUS 2 (HOSPITAL ORDER, PERFORMED IN [facility])  FERRITIN  TRIGLYCERIDES  FIBRINOGEN  C-REACTIVE PROTEIN  PROCALCITONIN  I-STAT BETA HCG BLOOD, ED ([facility], [facility], [facility] ONLY)    EKG None  Radiology Dg Chest  Portable 1 View  Result Date: 02/04/2019 CLINICAL DATA:  Cough with shortness of breath EXAM: PORTABLE CHEST 1 VIEW COMPARISON:  12/27/2018 FINDINGS: Bilateral mild interstitial and alveolar airspace opacities. No pleural effusion or pneumothorax. Stable cardiomediastinal silhouette. No acute osseous abnormality. IMPRESSION: 1. No acute cardiopulmonary disease. 2. Stable chronic interstitial lung disease. Electronically Signed   By: Elige Ko   On: 02/04/2019 21:25    Procedures Procedures (including critical care time)  Medications Ordered in ED Medications - No data to display   Initial Impression / Assessment and Plan / ED Course  I have reviewed the triage vital signs and the nursing notes.  Pertinent labs & imaging results that were available during my care of the patient were reviewed by me and considered in my medical decision making (see chart for details).  Pt is  a 38 year old female with extensive PMHx with increased oxygen demand for the past 3 days. Baseline 2 L oxygen but has had to increase to 4 L. Pt not tachycardic or tachypneic on exam although initially presented mildly tachycardic. LCTAB. CXR in triage with chronic interstitial lung disease but no acute changes and CBC without leukocytosis. Pt afebrile in the ED as well. Given immunocompromised state and symptoms concerning for COVID 19 will obtain swab in ED. CTA chest to rule out PE/to evaluate COVID findings as CXR negative; cannot PERC rule out patient with initial tachycardia; if all is negative anticipate dispo home as pt appears well.   Makayla Vasquez was evaluated in Emergency Department on 02/04/2019 for the symptoms described in the history of present illness. She was evaluated in the context of the global COVID-19 pandemic, which necessitated consideration that the patient might be at risk for infection with the SARS-CoV-2 virus that causes COVID-19. Institutional protocols and algorithms that pertain to the evaluation of patients at risk for COVID-19 are in a state of rapid change based on information released by regulatory bodies including the CDC and federal and state organizations. These policies and algorithms were followed during the patient's care in the ED.  11:13 PM At shift change case signed out to Dr. Eudelia Bunch who will interpret labs and imaging and dispo patient accordingly.         Final Clinical Impressions(s) / ED Diagnoses   Final diagnoses:  None    ED Discharge Orders    None       Tanda Rockers, PA-C 02/04/19 2320    Eber Hong, MD 02/07/19 1209

## 2019-02-04 NOTE — ED Notes (Signed)
Patient transported to CT 

## 2019-02-05 ENCOUNTER — Other Ambulatory Visit (HOSPITAL_COMMUNITY): Payer: Medicare HMO

## 2019-02-05 ENCOUNTER — Emergency Department (HOSPITAL_COMMUNITY): Payer: Medicare HMO

## 2019-02-05 DIAGNOSIS — R0602 Shortness of breath: Secondary | ICD-10-CM | POA: Diagnosis not present

## 2019-02-05 LAB — BRAIN NATRIURETIC PEPTIDE: B Natriuretic Peptide: 47.2 pg/mL (ref 0.0–100.0)

## 2019-02-05 LAB — SARS CORONAVIRUS 2 BY RT PCR (HOSPITAL ORDER, PERFORMED IN ~~LOC~~ HOSPITAL LAB): SARS Coronavirus 2: NEGATIVE

## 2019-02-05 LAB — FIBRINOGEN: Fibrinogen: 565 mg/dL — ABNORMAL HIGH (ref 210–475)

## 2019-02-05 MED ORDER — ALBUTEROL SULFATE HFA 108 (90 BASE) MCG/ACT IN AERS
6.0000 | INHALATION_SPRAY | Freq: Once | RESPIRATORY_TRACT | Status: AC
  Administered 2019-02-05: 02:00:00 6 via RESPIRATORY_TRACT
  Filled 2019-02-05: qty 6.7

## 2019-02-05 MED ORDER — IOHEXOL 350 MG/ML SOLN
100.0000 mL | Freq: Once | INTRAVENOUS | Status: AC | PRN
  Administered 2019-02-05: 100 mL via INTRAVENOUS

## 2019-02-05 NOTE — ED Provider Notes (Signed)
I assumed care of this patient from Dr. Hyacinth Meeker and Tanda Rockers, PA-C at 0000.  Please see their note for further details of Hx, PE.  Briefly patient is a 38 y.o. female who presented with shortness of breath and cough.  Chest x-ray with interstitial lung markings similar to prior.  Currently satting well on her home oxygen.  Pending COVID-19 testing and CTA to rule out PEs.  If studies negative the plan is to discharge the patient home  CTA without evidence of PE or pneumonia.  Revealed chronic interstitial lung disease.  Patient tested negative for COVID-19.  BNP was added and within normal limits.  Patient was able to ambulate on her home oxygen without the satting or becoming short of breath.  The patient appears reasonably screened and/or stabilized for discharge and I doubt any other medical condition or other [facility] requiring further screening, evaluation, or treatment in the ED at this time prior to discharge.  The patient is safe for discharge with strict return precautions.  Disposition: Discharge  Condition: Good  I have discussed the results, Dx and Tx plan with the patient who expressed understanding and agree(s) with the plan. Discharge instructions discussed at great length. The patient was given strict return precautions who verbalized understanding of the instructions. No further questions at time of discharge.    ED Discharge Orders    None       Follow Up: Primary care provider  Schedule an appointment as soon as possible for a visit  If symptoms do not improve or  worsen        Fusae Florio, Amadeo Garnet, MD 02/05/19 [phone]

## 2019-02-05 NOTE — ED Notes (Signed)
Pt ambulated around nursing station w/ steady gait.  O2 dropped to 91% on 4L of O2 via [place] and HR increased to 110.  Pt reported feeling at her baseline during ambulation.  Upon return to room pt began coughing.  Dr. Eudelia Bunch at bedside aware of findings during ambulation.

## 2019-02-07 ENCOUNTER — Other Ambulatory Visit: Payer: Self-pay

## 2019-02-07 ENCOUNTER — Encounter (HOSPITAL_COMMUNITY): Payer: Self-pay

## 2019-02-07 ENCOUNTER — Encounter (HOSPITAL_COMMUNITY): Payer: Medicare HMO | Admitting: Cardiology

## 2019-02-07 ENCOUNTER — Ambulatory Visit (HOSPITAL_COMMUNITY)
Admission: RE | Admit: 2019-02-07 | Discharge: 2019-02-07 | Disposition: A | Discharge status: Home / Self Care | Payer: Medicare HMO | Source: Ambulatory Visit | Attending: Cardiology | Admitting: Cardiology

## 2019-02-07 VITALS — BP 90/62

## 2019-02-07 DIAGNOSIS — I5022 Chronic systolic (congestive) heart failure: Secondary | ICD-10-CM | POA: Diagnosis not present

## 2019-02-07 DIAGNOSIS — J9611 Chronic respiratory failure with hypoxia: Secondary | ICD-10-CM

## 2019-02-07 NOTE — Progress Notes (Signed)
Spoke to pt to review after visit summary. Pt verbalized understanding. Appointment made. Pt denied needs for refills. Mychart message sent

## 2019-02-07 NOTE — Addendum Note (Signed)
Encounter addended by: Nicole Cella, RN on: 02/07/2019 2:52 PM  Actions taken: Clinical Note Signed

## 2019-02-07 NOTE — Patient Instructions (Addendum)
Follow up in 6-8 weeks with the Advanced Practice Providers. This is scheduled for June 2nd at 10:00 am   Referred to [facility] for [employer]. They will contact you for set up.

## 2019-02-07 NOTE — Progress Notes (Signed)
Heart Failure TeleHealth Note  Due to national recommendations of social distancing due to COVID 19, Audio/video telehealth visit is felt to be most appropriate for this patient at this time.  See MyChart message from today for patient consent regarding telehealth for [facility].  Date:  02/07/2019   ID:  Makayla Vasquez, DOB 1981-07-23, MRN [id number]  Location: Home  Provider location: [facility] Type of Visit: Established patient   PCP:  Wendee Beavers, DO  Cardiologist:  No primary care provider on file. Primary HF: Dr Shirlee Latch   Chief Complaint: Heart Failure   History of Present Illness: Makayla Vasquez is a 38 y.o. 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   She has a h/o congenital HB and underwent first PPM at age 60. In 2011 had ECHO with EF 35-40%. LV dysfunction felt to be due to RV pacing so upgraded to CRT-P. Echo in 10/15 with EF 25%.  She also has concomitant lung disease and this has limited b-blocker dosing.  She follows with Dr. Maple Hudson for her lung disease. She is a former smoker.  Admitted in 8/15 and 9/15 with worsening respiratory status and severe hypoxia. Had extensive w/u. Thought to have a viral pneumonitis complicated by atelectasis and mild edema vs. an autoimmune process. Discharged home on a course of levaquin and prednisone.   She was offered a lung biopsy for further diagnosis versus empiric treatment with steroids. She opted for empiric steroid treatment. She is followed by a rheumatologist and is on Cellcept, prednisone, and tacrolimus.  She is on oxygen and night and with activity.   Admission in 5/17 for ?sepsis and management of interstitial lung disease.     She was admitted with PNA in 1/18.  Lasix, losartan, and bisoprolol stopped.   Echo in 1/19 showed EF 45-50%, with mild to  moderately decreased RV systolic function and moderate MR.  Echo was done today and reviewed, EF 35-40% with normal RV size and mildly decreased RV systolic function.   She presents via Health and safety inspector for a telehealth visit today.  She was evaluated at [facility] ED on 02/04/19 for increased SOB. CT of chest and CXR ok. COVID 19 was negative. She was discharged the same day. Overall all feeling ok. Remains SOB with exertion but this is her baseline. Denies PND/Orthopnea. She remains on 4 liters  oxygen . Appetite ok. No fever or chills. She does not have a working scale.  Taking all medications. Lives alone. She has to rely on her sister for groceries.    she denies symptoms worrisome for COVID 19. Had negative COVID test on 02/05/19.   Past Medical History:  Diagnosis Date   Anxiety    Arthritis    rheumatoid arthritis- mild, no rheumatology care    Asthma    seasonal allergies    Asymptomatic LV dysfunction    a. Echo in Dec 2011 with EF 35 to 40%. Felt to be due to paced rhythm. b. EF 25-30% in 07/2014.   Bipolar affective disorder [facility])    Cardiac pacemaker    a. Since age 68 in 76. b. Upgrade to BiV in 2013.   Carpal tunnel syndrome of right wrist    CHF (congestive heart failure) (HCC)    Congenital complete AV block    Cor pulmonale, chronic (HCC) 04/08/2018  Depression    bipolar   Diabetes mellitus without complication (HCC)    GERD (gastroesophageal reflux disease)    Hypertension    Interstitial lung disease (HCC)    Lupus (HCC)    Lupus (systemic lupus erythematosus) (HCC)    Obesity    Pneumonitis    a. a/w hypoxia - inflammatory - large workup 07/2014.   Presence of permanent cardiac pacemaker    Seizures (HCC)    as a child- from high fever   Sinus tachycardia    Past Surgical History:  Procedure Laterality Date   ATRIAL TACH ABLATION N/A 08/14/2014   Procedure: ATRIAL TACH ABLATION;  Surgeon: Marinus MawGregg W Taylor, MD;  [facility];   Service: Cardiovascular;  Laterality: N/A;   BI-VENTRICULAR PACEMAKER UPGRADE N/A 03/08/2012   Procedure: BI-VENTRICULAR PACEMAKER UPGRADE;  Surgeon: Marinus MawGregg W Taylor, MD;  [facility];  Service: Cardiovascular;  Laterality: N/A;   CARPAL TUNNEL WITH CUBITAL TUNNEL Right 07/26/2013   Procedure: RIGHT LIMITED OPEN CARPAL TUNNEL RELEASE ,  RIGHT CUBITAL TUNNEL RELEASE, INSITU VERSES ULNAR NERVE DECOMPRESSION AND ANTERIOR TRANSPOSITION;  Surgeon: Dominica SeverinWilliam Gramig, MD;  Location: [facility] OR;  Service: Orthopedics;  Laterality: Right;   CESAREAN SECTION     CHOLECYSTECTOMY     CYST EXCISION  12/10/2012   THYROID   INSERT / REPLACE / REMOVE PACEMAKER     2001   IUD REMOVAL  11/03/2011   Procedure: INTRAUTERINE DEVICE (IUD) REMOVAL;  Surgeon: Hollie SalkMyra C. Marice Potterove, MD;  Location: [facility] ORS;  Service: Gynecology;  Laterality: N/A;   NASAL FRACTURE SURGERY     /w plate    RIGHT HEART CATHETERIZATION N/A 10/26/2014   Procedure: RIGHT HEART CATH;  Surgeon: Dolores Pattyaniel R Bensimhon, MD;  [facility];  Service: Cardiovascular;  Laterality: N/A;   THROAT SURGERY  1994   s/p laser treatment   THYROGLOSSAL DUCT CYST N/A 12/10/2012   Procedure: REVISION OF THYROGLOSSAL DUCT CYST EXCISION;  Surgeon: Serena ColonelJefry Rosen, MD;  [facility];  Service: ENT;  Laterality: N/A;  Revision of Thyroglossal Duct Cyst Excision   VIDEO BRONCHOSCOPY Bilateral 06/19/2014   Procedure: VIDEO BRONCHOSCOPY WITHOUT FLUORO;  Surgeon: Kalman ShanMurali Ramaswamy, MD;  [facility];  Service: Cardiopulmonary;  Laterality: Bilateral;     Current Outpatient Medications  Medication Sig Dispense Refill   albuterol (PROVENTIL HFA;VENTOLIN HFA) 108 (90 Base) MCG/ACT inhaler INHALE 2 PUFFS BY MOUTH EVERY 4 HOURS AS NEEDED FOR WHEEZE OR FOR SHORTNESS OF BREATH (Patient taking differently: Inhale 2 puffs into the lungs every 4 (four) hours as needed for wheezing or shortness of breath. ) 6.7 Inhaler 0   bisoprolol (ZEBETA) 5 MG tablet Take 1  tablet (5 mg total) by mouth daily. (Patient taking differently: Take 5 mg by mouth at bedtime. ) 90 tablet 1   dextromethorphan-guaiFENesin (ROBITUSSIN-DM) 10-100 MG/5ML liquid TAKE 5 MLS BY MOUTH EVERY 4 (FOUR) HOURS AS NEEDED FOR COUGH. (Patient taking differently: Take 5 mLs by mouth every 4 (four) hours as needed for cough. ) 118 mL 0   Etonogestrel (NEXPLANON [place]) Inject 1 Device into the skin once. Every 3 years     fluticasone (FLONASE) 50 MCG/ACT nasal spray Place 2 sprays into both nostrils daily. 16 g 6   furosemide (LASIX) 40 MG tablet TAKE 1 TABLET BY MOUTH ONCE DAILY (Patient taking differently: Take 40 mg by mouth daily. ) 30 tablet 5   ipratropium (ATROVENT) 0.06 % nasal spray Place 2 sprays into both nostrils 4 (  four) times daily. (Patient taking differently: Place 2 sprays into both nostrils 4 (four) times daily as needed for rhinitis. ) 15 mL 12   LANTUS SOLOSTAR 100 UNIT/ML Solostar Pen INJECT 15 UNITS SUBCUTANEOUSLY EVERY DAY (Patient taking differently: Inject 15 Units into the skin daily. ) 15 pen 1   metFORMIN (GLUCOPHAGE) 1000 MG tablet Take 1 tablet (1,000 mg total) by mouth 2 (two) times daily with a meal. 90 tablet 3   mycophenolate (CELLCEPT) 500 MG tablet Take 1,000 mg by mouth 2 (two) times daily.      NOVOLOG FLEXPEN 100 UNIT/ML FlexPen INJECT 8 UNITS INTO THE SKIN 3 TIMES DAILY WITH MEALS. (Patient taking differently: Inject 8 Units into the skin 3 (three) times daily with meals. ) 15 pen 4   omeprazole (PRILOSEC) 20 MG capsule TAKE 1 CAPSULE BY MOUTH EVERY DAY (Patient taking differently: Take 20 mg by mouth daily before breakfast. ) 90 capsule 0   OXYGEN Inhale 2-4 L into the lungs continuous.      potassium chloride SA (K-DUR,KLOR-CON) 20 MEQ tablet Take 1 tablet (20 mEq total) by mouth daily. 90 tablet 3   predniSONE (DELTASONE) 10 MG tablet Take 10 mg by mouth daily with breakfast.     sodium chloride (OCEAN) 0.65 % SOLN nasal spray Place 2 sprays into  both nostrils as needed for congestion. 1 Bottle 0   tacrolimus (PROGRAF) 0.5 MG capsule Take 1.5 mg by mouth 2 (two) times daily.     traZODone (DESYREL) 50 MG tablet TAKE 1 TABLET (50 MG TOTAL) BY MOUTH AT BEDTIME AS NEEDED FOR SLEEP. 90 tablet 2   Azelastine-Fluticasone (DYMISTA) 137-50 MCG/ACT SUSP Place 2 sprays into both nostrils 1 day or 1 dose. (Patient not taking: Reported on 02/04/2019) 1 Bottle 5   benzonatate (TESSALON) 100 MG capsule Take 1 capsule (100 mg total) by mouth 2 (two) times daily as needed for cough. (Patient not taking: Reported on 02/04/2019) 20 capsule 0   doxycycline (VIBRA-TABS) 100 MG tablet Take 1 tablet (100 mg total) by mouth 2 (two) times daily. (Patient not taking: Reported on 02/07/2019) 20 tablet 0   No current facility-administered medications for this encounter.     Allergies:   Atorvastatin; Sertraline hcl; and Tape   Social History:  The patient  reports that she quit smoking about 22 years ago. Her smoking use included cigarettes. She has a 0.13 pack-year smoking history. She has never used smokeless tobacco. She reports that she does not drink alcohol or use drugs.   Family History:  The patient's family history includes Heart disease in her father and mother; Heart disease (age of onset: 51) in her sister; Hypertension in her mother; Lupus in her mother.   ROS:  Please see the history of present illness.   All other systems are personally reviewed and negative.   Exam:  Tele Health Call; Exam is subjective  General:  Speaks in full sentences. No resp difficulty. Lungs: Normal respiratory effort with conversation. On 4 liters  Abdomen: Non-distended per patient report Extremities: Pt denies edema. Neuro: Alert & oriented x 3.   Recent Labs: 10/24/2018: Magnesium 1.2 12/27/2018: ALT 6 02/04/2019: B Natriuretic Peptide 47.2; BUN 14; Creatinine, Ser 0.87; Hemoglobin 9.9; Platelets 331; Potassium 4.1; Sodium 140  Personally reviewed   Wt Readings  from Last 3 Encounters:  02/04/19 66.7 kg (147 lb)  12/27/18 62.1 kg (137 lb)  12/01/18 64 kg (141 lb)    Vitals:   02/07/19 1427  BP: 90/62      ASSESSMENT AND PLAN:  1) Chronic systolic HF:  Echo 05/2014 EF 20-25%, echo 1/19 improved with EF 45-50% and mild-moderately decreased systolic function. Echo 11/2018 EF 35-40% with mild RV systolic dysfunction.  Nonischemic cardiomyopathy.   -NYHA IIIb Chronically . Continue to home oxygen.   - Volume status sound stable. Continue Lasix 40 mg daily.   -  Continue bisoprolol 5 mg qhs. BP has been low. Would not start spiro at this time.  Would tolerate SBP >/= 85 as long as she is not symptomatic.  2) Anti-synthetase syndrome with interstitial lung disease: This is associated with elevated ant-Jo1 Ab, ILD, and myositis.  CK has been persistently elevated.  She is followed by rheumatology and is on Cellcept, tacrolimus, and prednisone.    3) Congenital AVN block s/p pacer: Has EP followup.  4)?Atrial fibrillation:  She will send device  Transmission today.     COVID screen The patient does not have any symptoms that suggest any further testing/ screening at this time.  Social distancing reinforced today.  Patient Risk: After full review of this patients clinical status, I feel that they are at moderate risk for cardiac decompensation at this time.  Relevant cardiac medications were reviewed at length with the patient today. The patient does not have concerns regarding their medications at this time.    The following changes were made today:  None  Recommended follow-up:  Follow up in 6-8 weeks. Referred to HFSW for Moms Meals.   Today, I have spent 15 minutes with the patient with telehealth technology discussing the above issues .    Waneta Martins, NP  02/07/2019 2:30 PM  [facility] [address] Heart and Vascular [place] [address] [phone] (office) [phone] (fax)

## 2019-02-08 ENCOUNTER — Telehealth (HOSPITAL_COMMUNITY): Payer: Self-pay | Admitting: Licensed Clinical Social Worker

## 2019-02-08 NOTE — Telephone Encounter (Signed)
CSW consulted to speak with pt regarding potential food insecurities.  Pt reports that she currently has sufficient food at this time but that it can sometimes be difficult to get food since she relies on others to help her with her shopping.  Pt reports no financial concerns with getting groceries so CSW discussed possibility of ordering online groceries.  Pt has easy access to the internet and feels confident she would be able to navigate this on her own.  CSW will continue to follow and assist as needed  Burna Sis, LCSW Clinical Social Worker [facility] Desk#: [phone] Cell#: [phone]

## 2019-02-09 ENCOUNTER — Other Ambulatory Visit: Payer: Self-pay | Admitting: *Deleted

## 2019-02-09 NOTE — Patient Outreach (Signed)
[facility]) Care Management  02/09/2019  Makayla Vasquez Sep 30, 1981 [id number]   Initial telephone screening call   Referral source: Insurance plan Referral reason : Tier 5, multiple co morbidities   Admission 10/24/18  ED visits : 3/9, 4/17.   Chart reviewed for history: Chronic systolic heart failure , interstital lung disease, Lupus, congential heart block, CRT-P, DM A1c 6.6 9/27,     Outreach call to patient , explained reason for the call and [facility] care management services.   Patient discussed her recent visit on ED related to complaint of cough, she reports that it is a chronic cough she thinks related to pollen now. She denies increased shortness of breath , fever, discussed negative Covid 19 test.  She further discussed chronic conditions Heart failure  She reports taking medications daily, denies increase in shortness of breath or swelling. Patient reports weighing about 2 times a week, she states now that her scales are not working she needs new batteries and will have someone pick them up for her. Discussed worsening symptoms of heart failure and how tracking weights daily help with identifying sudden weight gain of 2 pounds in a day and 5 in a week.  Diabetes Patient reports checking blood sugar about 1 to 2 times a day blood sugar today was 143, she is unsure of her A1c result  , and taking Lantus daily and novolog 3 times a day. Patient reports using a relion meter.Patient denies having symptoms of low blood sugar when reviewed , voiced understanding of how to treat.  Anti-sythetase syndrome /Lupus  Patient discussed followed by MD at [facility] , no recent flare ups. Interstitial lung disease Patient followed by Dr.Young, she wears oxygen at 2 to 4 liters at all times  . Social  Patient lives at home with her 51 year old son. Patient discussed being independent with daily care, she drives herself to appointments usually. Patient has a walker for use when  she has a flair up with lupus. Patient reports having a supportive sister that does shopping for her, patient  is not going out now due to covid restrictions and chronic conditions.  Medications  Patient reports managing daily medications,  taking from the bottles daily, discussed pill packaging states that may not work for due to her medications changes frequently at times .She gets medication through mail order from [facility] and locally at [facility].    Advanced Directive Patient does not have, living will or HCOPA, she is agreeable to receiving information.   Consent  Explained and offered [facility] care management services , for management of complex medical conditions ,patient is eligible for services  recent ED visit , patient is agreeable to telephonic follow up.    Plan  Will place [facility] complex care management referral for assistance in  management of chronic medical conditions to follow treatment plans. Will send successful outreach letter, with Advanced directive packet and emmi handout sent .    Egbert Garibaldi, RN, [facility] Care Management,Care Management Coordinator  [phone]- Mobile [phone]- Toll Free Main Office

## 2019-02-14 ENCOUNTER — Other Ambulatory Visit: Payer: Self-pay | Admitting: *Deleted

## 2019-02-14 NOTE — Patient Outreach (Signed)
[facility]) Care Management  02/14/2019  Makayla Vasquez [birth year] [id number]   Referral received from care manager requesting assistance with complex management of chronic medical conditions.  Per chart, she has history of diabetes (A1C = 6.6 in September 2019), congestive heart failure, cardiomyopathy, lupus, interstitial lung disease, GERD, and bipolar.  Call placed to member for telephone assessment, no answer.  HIPAA compliant voice message left.  Unsuccessful outreach letter sent, will follow up within the next 3-4 business days.  Kemper Durie, [place], MSN [facility] Care Management  [facility] Manager [phone]

## 2019-02-17 ENCOUNTER — Other Ambulatory Visit: Payer: Self-pay | Admitting: *Deleted

## 2019-02-17 NOTE — Patient Outreach (Signed)
[facility]) Care Management  02/17/2019  Makayla Vasquez 09/29/1981 [id number]   Second attempt made to contact member for telephone assessment.  Female answering phone stated she was the member, but call abruptly ended after this care manager introduced self.  Call placed back to member, no answer.  HIPAA compliant voice message left.  Will await call back, if no call back will follow up within the next 3-4 business days.  Kemper Durie, [place], MSN [facility] Care Management  [facility] Manager [phone]

## 2019-02-22 ENCOUNTER — Ambulatory Visit: Payer: Self-pay | Admitting: *Deleted

## 2019-02-23 ENCOUNTER — Other Ambulatory Visit: Payer: Self-pay | Admitting: *Deleted

## 2019-02-23 NOTE — Patient Outreach (Signed)
[facility]) Care Management  02/23/2019  SRISHTI LABRAKE December 05, 1980 [id number]   Third attempt made to contact member for telephone assessment.  This care manager introduced self, female answering phone stated something inaudible and call abruptly ended.  This is the second time call was ended in this manner after this care manager introduced self.  Will await call back, if no call back by 5/11 will close case due to inability to engage/establish contact.  Kemper Durie, [place], MSN [facility] Care Management  [facility] Manager [phone]

## 2019-02-24 ENCOUNTER — Emergency Department (HOSPITAL_COMMUNITY)
Admission: EM | Admit: 2019-02-24 | Discharge: 2019-02-24 | Disposition: A | Discharge status: Home / Self Care | Payer: Medicare HMO | Attending: Emergency Medicine | Admitting: Emergency Medicine

## 2019-02-24 ENCOUNTER — Encounter (HOSPITAL_COMMUNITY): Payer: Self-pay

## 2019-02-24 ENCOUNTER — Other Ambulatory Visit: Payer: Self-pay

## 2019-02-24 ENCOUNTER — Other Ambulatory Visit: Payer: Self-pay | Admitting: Family Medicine

## 2019-02-24 ENCOUNTER — Emergency Department (HOSPITAL_COMMUNITY): Payer: Medicare HMO

## 2019-02-24 DIAGNOSIS — F319 Bipolar disorder, unspecified: Secondary | ICD-10-CM | POA: Diagnosis not present

## 2019-02-24 DIAGNOSIS — Z87891 Personal history of nicotine dependence: Secondary | ICD-10-CM | POA: Diagnosis not present

## 2019-02-24 DIAGNOSIS — R110 Nausea: Secondary | ICD-10-CM | POA: Insufficient documentation

## 2019-02-24 DIAGNOSIS — Z20828 Contact with and (suspected) exposure to other viral communicable diseases: Secondary | ICD-10-CM | POA: Insufficient documentation

## 2019-02-24 DIAGNOSIS — M3210 Systemic lupus erythematosus, organ or system involvement unspecified: Secondary | ICD-10-CM | POA: Insufficient documentation

## 2019-02-24 DIAGNOSIS — E119 Type 2 diabetes mellitus without complications: Secondary | ICD-10-CM | POA: Diagnosis not present

## 2019-02-24 DIAGNOSIS — I10 Essential (primary) hypertension: Secondary | ICD-10-CM | POA: Diagnosis not present

## 2019-02-24 DIAGNOSIS — R05 Cough: Secondary | ICD-10-CM | POA: Diagnosis present

## 2019-02-24 DIAGNOSIS — Z79899 Other long term (current) drug therapy: Secondary | ICD-10-CM | POA: Insufficient documentation

## 2019-02-24 DIAGNOSIS — Z8709 Personal history of other diseases of the respiratory system: Secondary | ICD-10-CM | POA: Diagnosis not present

## 2019-02-24 DIAGNOSIS — R0602 Shortness of breath: Secondary | ICD-10-CM | POA: Insufficient documentation

## 2019-02-24 DIAGNOSIS — Z950 Presence of cardiac pacemaker: Secondary | ICD-10-CM | POA: Diagnosis not present

## 2019-02-24 DIAGNOSIS — B9789 Other viral agents as the cause of diseases classified elsewhere: Principal | ICD-10-CM

## 2019-02-24 DIAGNOSIS — R10817 Generalized abdominal tenderness: Secondary | ICD-10-CM | POA: Diagnosis not present

## 2019-02-24 DIAGNOSIS — Z7984 Long term (current) use of oral hypoglycemic drugs: Secondary | ICD-10-CM | POA: Insufficient documentation

## 2019-02-24 DIAGNOSIS — J069 Acute upper respiratory infection, unspecified: Secondary | ICD-10-CM

## 2019-02-24 DIAGNOSIS — R059 Cough, unspecified: Secondary | ICD-10-CM

## 2019-02-24 LAB — CBC WITH DIFFERENTIAL/PLATELET
Abs Immature Granulocytes: 0.03 10*3/uL (ref 0.00–0.07)
Basophils Absolute: 0 10*3/uL (ref 0.0–0.1)
Basophils Relative: 0 %
Eosinophils Absolute: 0.1 10*3/uL (ref 0.0–0.5)
Eosinophils Relative: 1 %
HCT: 32.5 % — ABNORMAL LOW (ref 36.0–46.0)
Hemoglobin: 9.2 g/dL — ABNORMAL LOW (ref 12.0–15.0)
Immature Granulocytes: 0 %
Lymphocytes Relative: 17 %
Lymphs Abs: 1.2 10*3/uL (ref 0.7–4.0)
MCH: 23.6 pg — ABNORMAL LOW (ref 26.0–34.0)
MCHC: 28.3 g/dL — ABNORMAL LOW (ref 30.0–36.0)
MCV: 83.3 fL (ref 80.0–100.0)
Monocytes Absolute: 0.3 10*3/uL (ref 0.1–1.0)
Monocytes Relative: 5 %
Neutro Abs: 5.6 10*3/uL (ref 1.7–7.7)
Neutrophils Relative %: 77 %
Platelets: 265 10*3/uL (ref 150–400)
RBC: 3.9 MIL/uL (ref 3.87–5.11)
RDW: 16.6 % — ABNORMAL HIGH (ref 11.5–15.5)
WBC: 7.3 10*3/uL (ref 4.0–10.5)
nRBC: 0 % (ref 0.0–0.2)

## 2019-02-24 LAB — SARS CORONAVIRUS 2 BY RT PCR (HOSPITAL ORDER, PERFORMED IN ~~LOC~~ HOSPITAL LAB): SARS Coronavirus 2: NEGATIVE

## 2019-02-24 LAB — COMPREHENSIVE METABOLIC PANEL
ALT: 9 U/L (ref 0–44)
AST: 16 U/L (ref 15–41)
Albumin: 3 g/dL — ABNORMAL LOW (ref 3.5–5.0)
Alkaline Phosphatase: 68 U/L (ref 38–126)
Anion gap: 13 (ref 5–15)
BUN: 9 mg/dL (ref 6–20)
CO2: 21 mmol/L — ABNORMAL LOW (ref 22–32)
Calcium: 8.5 mg/dL — ABNORMAL LOW (ref 8.9–10.3)
Chloride: 104 mmol/L (ref 98–111)
Creatinine, Ser: 0.82 mg/dL (ref 0.44–1.00)
GFR calc Af Amer: >60 mL/min (ref >60)
GFR calc non Af Amer: >60 mL/min (ref >60)
Glucose, Bld: 169 mg/dL — ABNORMAL HIGH (ref 70–99)
Potassium: 4.3 mmol/L (ref 3.5–5.1)
Sodium: 138 mmol/L (ref 135–145)
Total Bilirubin: 0.2 mg/dL — ABNORMAL LOW (ref 0.3–1.2)
Total Protein: 6.8 g/dL (ref 6.5–8.1)

## 2019-02-24 LAB — LIPASE, BLOOD: Lipase: 42 U/L (ref 11–51)

## 2019-02-24 LAB — LACTIC ACID, PLASMA
Lactic Acid, Venous: 4.6 mmol/L (ref 0.5–1.9)
Lactic Acid, Venous: 1.9 mmol/L (ref 0.5–1.9)

## 2019-02-24 LAB — I-STAT BETA HCG BLOOD, ED (MC, WL, AP ONLY): I-stat hCG, quantitative: <5 m[IU]/mL (ref <5)

## 2019-02-24 LAB — TROPONIN I: Troponin I: <0.03 ng/mL (ref <0.03)

## 2019-02-24 LAB — BRAIN NATRIURETIC PEPTIDE: B Natriuretic Peptide: 132.5 pg/mL — ABNORMAL HIGH (ref 0.0–100.0)

## 2019-02-24 MED ORDER — SODIUM CHLORIDE 0.9 % IV BOLUS
500.0000 mL | Freq: Once | INTRAVENOUS | Status: AC
  Administered 2019-02-24: 500 mL via INTRAVENOUS

## 2019-02-24 MED ORDER — METHYLPREDNISOLONE SODIUM SUCC 125 MG IJ SOLR
125.0000 mg | Freq: Once | INTRAMUSCULAR | Status: AC
  Administered 2019-02-24: 125 mg via INTRAVENOUS
  Filled 2019-02-24: qty 2

## 2019-02-24 MED ORDER — BENZONATATE 100 MG PO CAPS: 100.0000 mg | ORAL_CAPSULE | Freq: Three times a day (TID) | ORAL | 0 refills | Status: DC

## 2019-02-24 NOTE — ED Triage Notes (Signed)
Pt had cough 4x days, increasing today in severity. 2-4L O2 at home for ISLD. Dropping into the 80's with coughing fits.

## 2019-02-24 NOTE — ED Provider Notes (Signed)
[facility]   CSN: [id number] Arrival date & time: 02/24/19  0122    History   Chief Complaint Chief Complaint  Patient presents with   Cough    HPI Makayla Vasquez is a 38 y.o. female has been history of asthma, cardiac pacemaker, congenital complete AV block, hypertension, interstitial lung disease, lupus brought in by EMS for evaluation of progressively worsening shortness of breath.  Patient was seen in the emergency department on 02/04/2019 for evaluation of shortness of breath.  At that time, her work-up was unremarkable.  She was discharged home.  Patient states that she was improving slightly but then over the last week or so, her symptoms began coming again.  She states that her last 2 days, the shortness of breath and cough have progressively worsened.  She states that she will get a coughing fit and feel like she cannot catch her breath.  She reports she is on 2-4 L of oxygen at home at baseline.  She has had to periodically increase it to 5 L to help with difficulty breathing. She reports yesterday she bumped up to 5 L but today she has been using her normal amount. She has been using her inhalers with no improvement in symptoms.  She has had some generalized abdominal pain with no focal point.  She also reports some nausea but denies any vomiting.  Patient states she will occasionally have some chest pain.  She states that pain feels like a sharp pain on the left side of her chest.  He reports it is slightly worsened by coughing. No pain with deep inspiration.  Patient states she has not had any fever at home.  Cough is productive of phlegm.  No hemoptysis.  No known sick contacts.  No travel.  No known COVID-19 exposure.  Patient denies any leg swelling, recent travel, recent surgeries, history of OCPs, history of hospitalizations.     The history is provided by the patient.    Past Medical History:  Diagnosis Date   Anxiety      Arthritis    rheumatoid arthritis- mild, no rheumatology care    Asthma    seasonal allergies    Asymptomatic LV dysfunction    a. Echo in Dec 2011 with EF 35 to 40%. Felt to be due to paced rhythm. b. EF 25-30% in 07/2014.   Bipolar affective disorder [facility](HCC)    Cardiac pacemaker    a. Since age 38 in [address]. b. Upgrade to BiV in 2013.   Carpal tunnel syndrome of right wrist    CHF (congestive heart failure) (HCC)    Congenital complete AV block    Cor pulmonale, chronic (HCC) 04/08/2018   Depression    bipolar   Diabetes mellitus without complication (HCC)    GERD (gastroesophageal reflux disease)    Hypertension    Interstitial lung disease (HCC)    Lupus (HCC)    Lupus (systemic lupus erythematosus) (HCC)    Obesity    Pneumonitis    a. a/w hypoxia - inflammatory - large workup 07/2014.   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) 10/24/2018  Diabetes mellitus due to therapeutic use of corticosteroid (HCC) 10/24/2018   Right wrist pain 07/24/2018   Anxiety state 07/21/2018   Upper airway cough syndrome 06/18/2018   Cor pulmonale, chronic (HCC) 04/08/2018   Abnormal uterine bleeding 02/14/2018   Dyspareunia, female 05/14/2017   Sepsis without acute organ dysfunction (HCC) 01/05/2017   Prolonged QT syndrome 11/04/2016   On Cellcept therapy    Lupus (HCC)    Fever    Long term (current) use of systemic steroids    Chronic diastolic congestive heart failure (HCC)    Interstitial lung disease (HCC)    Anemia    Bipolar affective disorder in remission (HCC)    Acute on chronic congestive heart failure (HCC)     Cardiac resynchronization therapy pacemaker (CRT-P) in place    Adrenal insufficiency (HCC)    Antisynthetase syndrome (HCC)    Idiopathic hypotension 07/08/2015   Hyperlipidemia associated with type 2 diabetes mellitus (HCC) 05/04/2015   Diabetes mellitus type 2 in obese (HCC)    Chronic respiratory failure with hypoxia (HCC) 08/07/2014   Acute respiratory failure with hypoxia (HCC) 06/19/2014   Congestive dilated cardiomyopathy (HCC) 05/31/2014   Chronic systolic heart failure (HCC) 02/26/2012   PPM-Medtronic    Congenital complete AV block    GERD (gastroesophageal reflux disease)    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   Polycystic ovaries 12/17/2006    Past Surgical History:  Procedure Laterality Date   ATRIAL TACH ABLATION N/A 08/14/2014   Procedure: ATRIAL TACH ABLATION;  Surgeon: Marinus Maw, MD;  Location: [facility] CATH LAB;  Service: Cardiovascular;  Laterality: N/A;   BI-VENTRICULAR PACEMAKER UPGRADE N/A 03/08/2012   Procedure: BI-VENTRICULAR PACEMAKER UPGRADE;  Surgeon: Marinus Maw, MD;  Location: [facility] CATH LAB;  Service: Cardiovascular;  Laterality: N/A;   CARPAL TUNNEL WITH CUBITAL TUNNEL Right 07/26/2013   Procedure: RIGHT LIMITED OPEN CARPAL TUNNEL RELEASE ,  RIGHT CUBITAL TUNNEL RELEASE, INSITU VERSES ULNAR NERVE DECOMPRESSION AND ANTERIOR TRANSPOSITION;  Surgeon: Dominica Severin, MD;  Location: [facility] OR;  Service: Orthopedics;  Laterality: Right;   CESAREAN SECTION     CHOLECYSTECTOMY     CYST EXCISION  12/10/2012   THYROID   INSERT / REPLACE / REMOVE PACEMAKER     2001   IUD REMOVAL  11/03/2011   Procedure: INTRAUTERINE DEVICE (IUD) REMOVAL;  Surgeon: Hollie Salk C. Marice Potter, MD;  Location: [facility] ORS;  Service: Gynecology;  Laterality: N/A;   NASAL FRACTURE SURGERY     /w plate    RIGHT HEART CATHETERIZATION N/A 10/26/2014   Procedure: RIGHT HEART CATH;  Surgeon: Dolores Patty, MD;  Location: [facility] CATH LAB;  Service: Cardiovascular;  Laterality: N/A;   THROAT  SURGERY  1994   s/p laser treatment   THYROGLOSSAL DUCT CYST N/A 12/10/2012   Procedure: REVISION OF THYROGLOSSAL DUCT CYST EXCISION;  Surgeon: Serena Colonel, MD;  Location: [facility] OR;  Service: ENT;  Laterality: N/A;  Revision of Thyroglossal Duct Cyst Excision   VIDEO BRONCHOSCOPY Bilateral 06/19/2014   Procedure: VIDEO BRONCHOSCOPY WITHOUT FLUORO;  Surgeon: Kalman Shan, MD;  Location: [facility] ENDOSCOPY;  Service: Cardiopulmonary;  Laterality: Bilateral;     OB History    Gravida  1   Para  1   Term  1   Preterm      AB      Living  1     SAB      TAB      Ectopic      Multiple  Live Births               Home Medications    Prior to Admission medications   Medication Sig Start Date End Date Taking? Authorizing Provider  albuterol (PROVENTIL HFA;VENTOLIN HFA) 108 (90 Base) MCG/ACT inhaler INHALE 2 PUFFS BY MOUTH EVERY 4 HOURS AS NEEDED FOR WHEEZE OR FOR SHORTNESS OF BREATH Patient taking differently: Inhale 2 puffs into the lungs every 4 (four) hours as needed for wheezing or shortness of breath.  01/31/19   Wendee BeaversMcMullen, David J, DO  Azelastine-Fluticasone (DYMISTA) 137-50 MCG/ACT SUSP Place 2 sprays into both nostrils 1 day or 1 dose. Patient not taking: Reported on 02/04/2019 12/01/18   Marcelyn BruinsPadgett, Shaylar Patricia, MD  benzonatate (TESSALON) 100 MG capsule Take 1 capsule (100 mg total) by mouth every 8 (eight) hours. 02/24/19   Maxwell CaulLayden, [place] Mikami A, PA-C  bisoprolol (ZEBETA) 5 MG tablet Take 1 tablet (5 mg total) by mouth daily. Patient taking differently: Take 5 mg by mouth at bedtime.  12/06/18   Laurey MoraleMcLean, Dalton S, MD  dextromethorphan-guaiFENesin (ROBITUSSIN-DM) 10-100 MG/5ML liquid TAKE 5 MLS BY MOUTH EVERY 4 (FOUR) HOURS AS NEEDED FOR COUGH. Patient taking differently: Take 5 mLs by mouth every 4 (four) hours as needed for cough.  10/27/18   Wendee BeaversMcMullen, David J, DO  doxycycline (VIBRA-TABS) 100 MG tablet Take 1 tablet (100 mg total) by mouth 2 (two) times daily. Patient not  taking: Reported on 02/07/2019 02/04/19   Tobey GrimWalden, Jeffrey H, MD  Etonogestrel [facility](NEXPLANON [place]) Inject 1 Device into the skin once. Every 3 years    [provider]  fluticasone (FLONASE) 50 MCG/ACT nasal spray Place 2 sprays into both nostrils daily. 02/04/19   Tobey GrimWalden, Jeffrey H, MD  furosemide (LASIX) 40 MG tablet TAKE 1 TABLET BY MOUTH ONCE DAILY Patient taking differently: Take 40 mg by mouth daily.  07/29/18   Laurey MoraleMcLean, Dalton S, MD  ipratropium (ATROVENT) 0.06 % nasal spray Place 2 sprays into both nostrils 4 (four) times daily. Patient taking differently: Place 2 sprays into both nostrils 4 (four) times daily as needed for rhinitis.  01/14/19   Garnette Gunnerhompson, Aaron B, MD  LANTUS SOLOSTAR 100 UNIT/ML Solostar Pen INJECT 15 UNITS SUBCUTANEOUSLY EVERY DAY Patient taking differently: Inject 15 Units into the skin daily.  01/05/19   Wendee BeaversMcMullen, David J, DO  metFORMIN (GLUCOPHAGE) 1000 MG tablet Take 1 tablet (1,000 mg total) by mouth 2 (two) times daily with a meal. 10/22/18   Wendee BeaversMcMullen, David J, DO  mycophenolate (CELLCEPT) 500 MG tablet Take 1,000 mg by mouth 2 (two) times daily.     [provider]  NOVOLOG FLEXPEN 100 UNIT/ML FlexPen INJECT 8 UNITS INTO THE SKIN 3 TIMES DAILY WITH MEALS. Patient taking differently: Inject 8 Units into the skin 3 (three) times daily with meals.  11/02/18   Wendee BeaversMcMullen, David J, DO  omeprazole (PRILOSEC) 20 MG capsule TAKE 1 CAPSULE BY MOUTH EVERY DAY Patient taking differently: Take 20 mg by mouth daily before breakfast.  02/01/19   Jetty DuhamelYoung, Clinton D, MD  OXYGEN Inhale 2-4 L into the lungs continuous.     [provider]  potassium chloride SA (K-DUR,KLOR-CON) 20 MEQ tablet Take 1 tablet (20 mEq total) by mouth daily. 02/01/19   Bensimhon, Bevelyn Bucklesaniel R, MD  predniSONE (DELTASONE) 10 MG tablet Take 10 mg by mouth daily with breakfast.    [provider]  sodium chloride (OCEAN) 0.65 % SOLN nasal spray Place 2 sprays into both nostrils as needed for  congestion. 01/14/19   Garnette Gunner, MD  tacrolimus (PROGRAF) 0.5 MG capsule Take 1.5 mg by mouth 2 (two) times daily. 11/27/17   [provider]  traZODone (DESYREL) 50 MG tablet TAKE 1 TABLET (50 MG TOTAL) BY MOUTH AT BEDTIME AS NEEDED FOR SLEEP. 09/27/18   Wendee Beavers, DO    Family History Family History  Problem Relation Age of Onset   Heart disease Mother        CHF (no details)   Hypertension Mother    Lupus Mother    Heart disease Father        Murmur   Heart disease Sister 25        No details.  History of a pacemaker    Social History Social History   Tobacco Use   Smoking status: Former Smoker    Packs/day: 0.25    Years: 0.50    Pack years: 0.12    Types: Cigarettes    Last attempt to quit: 07/25/1996    Years since quitting: 22.6   Smokeless tobacco: Never Used  Substance Use Topics   Alcohol use: No   Drug use: No     Allergies   Atorvastatin; Sertraline hcl; and Tape   Review of Systems Review of Systems  Constitutional: Negative for fever.  Respiratory: Positive for cough and shortness of breath.   Cardiovascular: Negative for chest pain.  Gastrointestinal: Positive for abdominal pain and nausea. Negative for vomiting.  Genitourinary: Negative for dysuria and hematuria.  Neurological: Negative for headaches.  All other systems reviewed and are negative.    Physical Exam Updated Vital Signs BP 113/78 (BP Location: Right Arm)    Pulse 85    Temp 98.7 F (37.1 C) (Oral)    Resp 17    Ht  (1.651 m)    Wt 66.7 kg    SpO2 95%    BMI 24.46 kg/m   Physical Exam Vitals signs and nursing note reviewed.  Constitutional:      Appearance: Normal appearance. She is well-developed.  HENT:     Head: Normocephalic and atraumatic.  Eyes:     General: Lids are normal.     Conjunctiva/sclera: Conjunctivae normal.     Pupils: Pupils are equal, round, and reactive to light.  Neck:     Musculoskeletal: Full passive range of  motion without pain.  Cardiovascular:     Rate and Rhythm: Normal rate and regular rhythm.     Pulses: Normal pulses.          Radial pulses are 2+ on the right side and 2+ on the left side.       Dorsalis pedis pulses are 2+ on the right side and 2+ on the left side.     Heart sounds: Normal heart sounds. No murmur. No friction rub. No gallop.   Pulmonary:     Effort: Pulmonary effort is normal.     Breath sounds: Rhonchi present.     Comments: Mild rhonchi with course cough noted. No evidence of distress.  Able to speak in full sentences without any difficulty. Abdominal:     Palpations: Abdomen is soft. Abdomen is not rigid.     Tenderness: There is generalized abdominal tenderness. There is no guarding.     Comments: Abdomen soft, nondistended.  Mild generalized tenderness.  No focal point.  No rigidity, guarding.  Musculoskeletal: Normal range of motion.     Comments: Lateral lower extremities are symmetric in appearance without  any overlying warmth, erythema, edema.  Skin:    General: Skin is warm and dry.     Capillary Refill: Capillary refill takes less than 2 seconds.  Neurological:     Mental Status: She is alert and oriented to person, place, and time.  Psychiatric:        Speech: Speech normal.      ED Treatments / Results  Labs (all labs ordered are listed, but only abnormal results are displayed) Labs Reviewed  COMPREHENSIVE METABOLIC PANEL - Abnormal; Notable for the following components:      Result Value   CO2 21 (*)    Glucose, Bld 169 (*)    Calcium 8.5 (*)    Albumin 3.0 (*)    Total Bilirubin 0.2 (*)    All other components within normal limits  CBC WITH DIFFERENTIAL/PLATELET - Abnormal; Notable for the following components:   Hemoglobin 9.2 (*)    HCT 32.5 (*)    MCH 23.6 (*)    MCHC 28.3 (*)    RDW 16.6 (*)    All other components within normal limits  LACTIC ACID, PLASMA - Abnormal; Notable for the following components:   Lactic Acid, Venous 4.6  (*)    All other components within normal limits  BRAIN NATRIURETIC PEPTIDE - Abnormal; Notable for the following components:   B Natriuretic Peptide 132.5 (*)    All other components within normal limits  SARS CORONAVIRUS 2 (HOSPITAL ORDER, PERFORMED IN [facility])  LIPASE, BLOOD  LACTIC ACID, PLASMA  TROPONIN I  I-STAT BETA HCG BLOOD, ED ([facility], [facility], [facility] ONLY)    EKG EKG Interpretation  Date/Time:  Thursday Feb 24 2019 02:30:07 EDT Ventricular Rate:  90 PR Interval:    QRS Duration: 126 QT Interval:  413 QTC Calculation: 506 R Axis:   -139 Text Interpretation:  Age not entered, assumed to be  38 years old for purpose of ECG interpretation Sinus rhythm Ventricular bigeminy Borderline short PR interval Nonspecific intraventricular conduction delay Consider anterior infarct No significant change since last tracing Confirmed by Marily Memos [id number]) on 02/24/2019 4:02:32 AM   Radiology Dg Chest Portable 1 View  Result Date: 02/24/2019 CLINICAL DATA:  Shortness of breath EXAM: PORTABLE CHEST 1 VIEW COMPARISON:  02/04/2019 FINDINGS: Left pacer remains in place, unchanged. Cardiomegaly. Stable chronic interstitial prominence throughout the lungs. No definite acute process. No effusions or acute bony abnormality. IMPRESSION: Cardiomegaly. Stable chronic interstitial lung disease. Electronically Signed   By: Charlett Nose M.D.   On: 02/24/2019 02:22    Procedures Procedures (including critical care time)  Medications Ordered in ED Medications  methylPREDNISolone sodium succinate (SOLU-MEDROL) 125 mg/2 mL injection 125 mg (125 mg Intravenous Given 02/24/19 0223)  sodium chloride 0.9 % bolus 500 mL (500 mLs Intravenous New Bag/Given 02/24/19 0451)     Initial Impression / Assessment and Plan / ED Course  I have reviewed the triage vital signs and the nursing notes.  Pertinent labs & imaging results that were available during my care of the patient were reviewed by me and  considered in my medical decision making (see chart for details).        38 year old female who comes in for evaluation of cough and shortness of breath.  She reports is been ongoing for the last 2 weeks.  She states that worsened over the last few days.  Seen here on 02/04/2019.  Work-up at that time was unremarkable.  Comes back in today because she states  she continues to have symptoms.  She is on 2-4 L O2 at home.  Patient reports that she has had to intermittently increase it to 5 L.  She reports yesterday, she bumped it up to 5 L but states that today, she was able to be at her baseline.  She states she has not had a fever.  Denies any known sick contacts.  Denies any known COVID-19 exposure.  Patient is afebrile, non-toxic appearing, sitting comfortably on examination table. Vital signs reviewed and stable.  Patient satting well on her baseline home oxygen of 3 L.  On exam, she does have some coarse rhonchi with some coughing.  No evidence of respiratory distress.  Abdomen exam is benign.  Check labs, chest x-ray.  Will give course of steroids and albuterol inhaler and reassess.  Review of patient's records here in the emergency department.  She was seen here on 02/14/19.  At that time, she had a work-up as well as CTA of chest which was negative for PE.  Patient ambulated without any desaturation and was discharged home.  Initial lactic acid is slightly elevated at 4.  Troponin negative.  Lipase unremarkable.  I-STAT beta negative.  CBC shows no evidence of leukocytosis.  Hemoglobin is 9.2.  Review of her records show that this is consistent with baseline.  CMP shows no acute abnormalities.  P slightly elevated at 132.  Chest x-ray negative for any acute infectious etiology.  No evidence of interstitial edema.  COVID-19 testing is negative.  Vitals remained stable while here in the ED.  Patient with no evidence of hypoxia or tachycardia.  I reviewed patient's record.  She had a CT of chest when she  was last here on 02/14/19.  No evidence of PE at that time.  Patient with no tachycardia or hypoxia here in ED.  No indication to repeat CTA here today. Discussed patient with Dr. Clayborne Dana who agrees with plan.  She is ambulated in the ED with normal baseline O2 with O2 sats stable.  No evidence of hypoxia.  Reevaluation.  Patient reports shortness of breath has improved but she states she is still coughing.  Repeat exam shows improvement in rhonchi.  She seems to have opened up.  O2 sats are stable.  Vitals are otherwise stable.  At this time, do not feel that admission is warranted.  She is not hypoxic here in the ED.  Discussed with patient regarding outcome supportive care measures. At this time, patient exhibits no emergent life-threatening condition that require further evaluation in ED or admission. Patient had ample opportunity for questions and discussion. All patient's questions were answered with full understanding. Strict return precautions discussed. Patient expresses understanding and agreement to plan.   Portions of this note were generated with Scientist, clinical (histocompatibility and immunogenetics). Dictation errors may occur despite best attempts at proofreading.    Final Clinical Impressions(s) / ED Diagnoses   Final diagnoses:  Cough  Shortness of breath    ED Discharge Orders         Ordered    benzonatate (TESSALON) 100 MG capsule  Every 8 hours     02/24/19 0658           Maxwell Caul, PA-C 02/24/19 [id number]    Marily Memos, MD 02/25/19 [phone]

## 2019-02-24 NOTE — ED Notes (Signed)
Discharge instructions reviewed. Pt verbalized understanding. IV removed and pt wheeled to waiting room

## 2019-02-24 NOTE — Discharge Instructions (Signed)
Follow-up with your primary care doctor.  Take Tessalon Perles as directed.  Return emergency department for any worsening symptoms, difficulty breathing, chest pain, fevers or any other worsening or concerning symptoms.

## 2019-02-24 NOTE — ED Notes (Signed)
Patient ambulated in the hall with home O2 amount. Patients sats stayed above 95% while walking.

## 2019-03-01 ENCOUNTER — Other Ambulatory Visit: Payer: Self-pay | Admitting: *Deleted

## 2019-03-01 NOTE — Patient Outreach (Signed)
[facility]) Care Management  03/01/2019  IFE CRAPSER Nov 19, 1980 [id number]   No response from member after multiple unsuccessful outreach attempts and letter sent.  Will close case at this time due to inability to maintain contact.  Will notify member and primary MD of case closure.   Kemper Durie, [place], MSN [facility] Care Management  [facility] Manager [phone]

## 2019-03-02 ENCOUNTER — Other Ambulatory Visit: Payer: Self-pay

## 2019-03-02 ENCOUNTER — Encounter: Payer: Self-pay | Admitting: Allergy

## 2019-03-02 ENCOUNTER — Ambulatory Visit (INDEPENDENT_AMBULATORY_CARE_PROVIDER_SITE_OTHER): Payer: Medicare HMO | Admitting: Allergy

## 2019-03-02 DIAGNOSIS — J310 Chronic rhinitis: Secondary | ICD-10-CM

## 2019-03-02 MED ORDER — IPRATROPIUM BROMIDE 0.06 % NA SOLN: NASAL | 5 refills | Status: DC

## 2019-03-02 NOTE — Patient Instructions (Addendum)
Nonallergic rhinitis  - environmental allergy panel is negative thus symptoms are non-allergic in nature  - Discussed today given that environmental panel is negative that her symptoms are driven in a nonallergic fashion.  Discussed that things such as house dust and visible pollens are inhalants that get into the airway via the nose and the mouth and can cause very similar symptoms as if she had allergic driven symptoms including nasal congestion, drainage, sneezing, cough.  These particles such as house dust are irritants to the airway leading to symptoms.  Because of this she may not have much benefit from antihistamine use thus I have advised that she can stop the Xyzal.  Since she has not noticed any significant improvement with use of Dymista we will change to a different nasal spray below.  - Start nasal Atrovent 2 sprays each nostril for nasal congestion and drainage that can be used up to 3-4 times a day as needed.  - use nasal spray with proper technique demonstrated today (point tip of bottle towards eye on same side as nostril)  - recommend use of nasal saline spray to help keep the nose moisturized especially with oxygen use  Follow-up 3-4 months or sooner if needed

## 2019-03-02 NOTE — Progress Notes (Signed)
RE: Makayla Vasquez MRN: [id number] DOB: 01/03/1981 Date of Telemedicine Visit: 03/02/2019  Referring provider: Wendee Beavers, DO Primary care provider: Wendee Beavers, DO  Chief Complaint: Follow-up (Refills)   Telemedicine Follow Up Visit via Telephone: I connected with Makayla Vasquez for a follow up on 03/02/19 by telephone and verified that I am speaking with the correct person using two identifiers.   I discussed the limitations, risks, security and privacy concerns of performing an evaluation and management service by telephone and the availability of in person appointments. I also discussed with the patient that there may be a patient responsible charge related to this service. The patient expressed understanding and agreed to proceed.  Patient is at home.  Provider is at the office.  Visit start time: 1101 Visit end time: 1115 Insurance consent/check in by: Marlene Bast Medical consent and medical assistant/nurse: Rhae Hammock S  History of Present Illness: She is a 38 y.o. female, who is being followed for non-allergic rhinitis. Her previous allergy office visit was on 12/01/18 with Dr. Delorse Lek.   She states that she is not sure she can tell a difference in her allergy type symptoms including nasal congestion and drainage.  She does not feel that the Xyzal works any better than Zyrtec.  She also is not sure if the Dymista has been providing her any control in her nasal symptoms either.  She states she is doing Dymista 1 spray twice a day as directed as well as doing Xyzal daily.  We did obtain environmental panel at her visit and it came back negative with a normal total IgE level.   Assessment and Plan: Makayla Vasquez is a 38 y.o. female with:   Nonallergic rhinitis  - environmental allergy panel is negative thus symptoms are non-allergic in nature  - Discussed today given that environmental panel is negative that her symptoms are driven in a nonallergic fashion.  Discussed that things  such as house dust and visible pollens are inhalants that get into the airway via the nose and the mouth and can cause very similar symptoms as if she had allergic driven symptoms including nasal congestion, drainage, sneezing, cough.  These particles such as house dust are irritants to the airway leading to symptoms.  Because of this she may not have much benefit from antihistamine use thus I have advised that she can stop the Xyzal.  Since she has not noticed any significant improvement with use of Dymista we will change to a different nasal spray below.  - Start nasal Atrovent 2 sprays each nostril for nasal congestion and drainage that can be used up to 3-4 times a day as needed.  - use nasal spray with proper technique demonstrated today (point tip of bottle towards eye on same side as nostril)  - recommend use of nasal saline spray to help keep the nose moisturized especially with oxygen use  Follow-up 3-4 months or sooner if needed  Diagnostics: Environmental allergen IgE panel performed 12/01/18 is negative and an total IgE 16.    Medication List:  Current Outpatient Medications  Medication Sig Dispense Refill  . albuterol (VENTOLIN HFA) 108 (90 Base) MCG/ACT inhaler Inhale 2 puffs into the lungs every 4 (four) hours as needed for wheezing or shortness of breath. 6.7 Inhaler 0  . Azelastine-Fluticasone (DYMISTA) 137-50 MCG/ACT SUSP Place 2 sprays into both nostrils 1 day or 1 dose. 1 Bottle 5  . benzonatate (TESSALON) 100 MG capsule Take 1 capsule (100 mg total) by mouth  every 8 (eight) hours. 21 capsule 0  . bisoprolol (ZEBETA) 5 MG tablet Take 1 tablet (5 mg total) by mouth daily. (Patient taking differently: Take 5 mg by mouth at bedtime. ) 90 tablet 1  . dextromethorphan-guaiFENesin (ROBITUSSIN-DM) 10-100 MG/5ML liquid TAKE 5 MLS BY MOUTH EVERY 4 (FOUR) HOURS AS NEEDED FOR COUGH. (Patient taking differently: Take 5 mLs by mouth every 4 (four) hours as needed for cough. ) 118 mL 0  .  Etonogestrel (NEXPLANON [place]) Inject 1 Device into the skin once. Every 3 years    . fluticasone (FLONASE) 50 MCG/ACT nasal spray Place 2 sprays into both nostrils daily. 16 g 6  . furosemide (LASIX) 40 MG tablet TAKE 1 TABLET BY MOUTH ONCE DAILY (Patient taking differently: Take 40 mg by mouth daily. ) 30 tablet 5  . LANTUS SOLOSTAR 100 UNIT/ML Solostar Pen INJECT 15 UNITS SUBCUTANEOUSLY EVERY DAY (Patient taking differently: Inject 15 Units into the skin daily. ) 15 pen 1  . metFORMIN (GLUCOPHAGE) 1000 MG tablet Take 1 tablet (1,000 mg total) by mouth 2 (two) times daily with a meal. 90 tablet 3  . mycophenolate (CELLCEPT) 500 MG tablet Take 1,000 mg by mouth 2 (two) times daily.     [name] NOVOLOG FLEXPEN 100 UNIT/ML FlexPen INJECT 8 UNITS INTO THE SKIN 3 TIMES DAILY WITH MEALS. (Patient taking differently: Inject 8 Units into the skin 3 (three) times daily with meals. ) 15 pen 4  . omeprazole (PRILOSEC) 20 MG capsule TAKE 1 CAPSULE BY MOUTH EVERY DAY (Patient taking differently: Take 20 mg by mouth daily before breakfast. ) 90 capsule 0  . OXYGEN Inhale 2-4 L into the lungs continuous.     . potassium chloride SA (K-DUR,KLOR-CON) 20 MEQ tablet Take 1 tablet (20 mEq total) by mouth daily. 90 tablet 3  . predniSONE (DELTASONE) 10 MG tablet Take 10 mg by mouth daily with breakfast.    . tacrolimus (PROGRAF) 0.5 MG capsule Take 1.5 mg by mouth 2 (two) times daily.    . traZODone (DESYREL) 50 MG tablet TAKE 1 TABLET (50 MG TOTAL) BY MOUTH AT BEDTIME AS NEEDED FOR SLEEP. 90 tablet 2  . ipratropium (ATROVENT) 0.06 % nasal spray 2 sprays in each nostril 3-4 times daily as needed. 15 mL 5   No current facility-administered medications for this visit.    Allergies: Allergies  Allergen Reactions  . 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 Sertraline Hcl Hives  . Tape Other (See Comments)    "Burns" the skin   I reviewed her past medical  history, social history, family history, and environmental history and no significant changes have been reported from previous visit on 12/01/2018.  Review of Systems  Constitutional: Negative for chills and fever.  HENT: Positive for congestion, rhinorrhea and sneezing.   Eyes: Negative for pain, discharge and itching.  Respiratory: Positive for cough and shortness of breath.   Cardiovascular: Negative for chest pain.  Gastrointestinal: Negative for vomiting.  Skin: Negative for rash.  Neurological: Negative for headaches.  Remainder of ROS is negative unless noted in the HPI  Objective: Physical Exam Not obtained as encounter was done via telephone.   Previous notes and tests were reviewed.  I discussed the assessment and treatment plan with the patient. The patient was provided an opportunity to ask questions and all were answered. The patient agreed with the plan and demonstrated an understanding of the  instructions.   The patient was advised to call back or seek an in-person evaluation if the symptoms worsen or if the condition fails to improve as anticipated.  I provided 14 minutes of non-face-to-face time during this encounter.  It was my pleasure to participate in C[place] Oberman's care today. Please feel free to contact me with any questions or concerns.   Sincerely,  Zameria Vogl Larose Hires, MD

## 2019-03-11 ENCOUNTER — Other Ambulatory Visit: Payer: Self-pay | Admitting: *Deleted

## 2019-03-15 ENCOUNTER — Telehealth: Payer: Self-pay | Admitting: Internal Medicine

## 2019-03-15 MED ORDER — BD SWAB SINGLE USE REGULAR PADS: 1.0000 "application " | MEDICATED_PAD | Freq: Four times a day (QID) | 2 refills | Status: AC

## 2019-03-15 MED ORDER — ACCU-CHEK AVIVA VI SOLN: 1.0000 "application " | Freq: Four times a day (QID) | 0 refills | Status: DC

## 2019-03-15 NOTE — Telephone Encounter (Signed)
New message    Spoke w/pt about appt on 06.23.20 with Dr. Ladona Ridgel. Pt was changed to a virtual visit on 05.28.20. Pt will do video visit. Pt smart phone number is listed in appt notes.      Virtual Visit Pre-Appointment Phone Call  "(Name), I am calling you today to discuss your upcoming appointment. We are currently trying to limit exposure to the virus that causes COVID-19 by seeing patients at home rather than in the office."  1. "What is the BEST phone number to call the day of the visit?" - include this in appointment notes  2. Do you have or have access to (through a family member/friend) a smartphone with video capability that we can use for your visit?" a. If yes - list this number in appt notes as cell (if different from BEST phone #) and list the appointment type as a VIDEO visit in appointment notes b. If no - list the appointment type as a PHONE visit in appointment notes  3. Confirm consent - "In the setting of the current Covid19 crisis, you are scheduled for a (phone or video) visit with your provider on (date) at (time).  Just as we do with many in-office visits, in order for you to participate in this visit, we must obtain consent.  If you'd like, I can send this to your mychart (if signed up) or email for you to review.  Otherwise, I can obtain your verbal consent now.  All virtual visits are billed to your insurance company just like a normal visit would be.  By agreeing to a virtual visit, we'd like you to understand that the technology does not allow for your provider to perform an examination, and thus may limit your provider's ability to fully assess your condition. If your provider identifies any concerns that need to be evaluated in person, we will make arrangements to do so.  Finally, though the technology is pretty good, we cannot assure that it will always work on either your or our end, and in the setting of a video visit, we may have to convert it to a phone-only  visit.  In either situation, we cannot ensure that we have a secure connection.  Are you willing to proceed?" STAFF: Did the patient verbally acknowledge consent to telehealth visit? Document YES/NO here: YES  4. Advise patient to be prepared - "Two hours prior to your appointment, go ahead and check your blood pressure, pulse, oxygen saturation, and your weight (if you have the equipment to check those) and write them all down. When your visit starts, your provider will ask you for this information. If you have an Apple Watch or Kardia device, please plan to have heart rate information ready on the day of your appointment. Please have a pen and paper handy nearby the day of the visit as well."  5. Give patient instructions for MyChart download to smartphone OR Doximity/Doxy.me as below if video visit (depending on what platform provider is using)  6. Inform patient they will receive a phone call 15 minutes prior to their appointment time (may be from unknown caller ID) so they should be prepared to answer    TELEPHONE CALL NOTE  Makayla Vasquez has been deemed a candidate for a follow-up tele-health visit to limit community exposure during the Covid-19 pandemic. I spoke with the patient via phone to ensure availability of phone/video source, confirm preferred email & phone number, and discuss instructions and expectations.  I reminded  Makayla Vasquez to be prepared with any vital sign and/or heart rhythm information that could potentially be obtained via home monitoring, at the time of her visit. I reminded Makayla Vasquez to expect a phone call prior to her visit.  Ashland Toniann Fail 03/15/2019 11:46 AM   INSTRUCTIONS FOR DOWNLOADING THE MYCHART APP TO SMARTPHONE  - The patient must first make sure to have activated MyChart and know their login information - If Apple, go to [employer] and type in MyChart in the search bar and download the app. If Android, ask patient to go to [employer] and  type in [place] in the search bar and download the app. The app is free but as with any other app downloads, their phone may require them to verify saved payment information or Apple/Android password.  - The patient will need to then log into the app with their MyChart username and password, and select [facility] as their healthcare provider to link the account. When it is time for your visit, go to the MyChart app, find appointments, and click Begin Video Visit. Be sure to Select Allow for your device to access the Microphone and Camera for your visit. You will then be connected, and your provider will be with you shortly.  **If they have any issues connecting, or need assistance please contact MyChart service desk [phone])**  **If using a computer, in order to ensure the best quality for their visit they will need to use either of the following Internet Browsers: [employer] or Google Chrome**  IF USING DOXIMITY or DOXY.ME - The patient will receive a link just prior to their visit by text.     FULL LENGTH CONSENT FOR TELE-HEALTH VISIT   I hereby voluntarily request, consent and authorize [facility] and its employed or contracted physicians, physician assistants, nurse practitioners or other licensed health care professionals (the Practitioner), to provide me with telemedicine health care services (the Services") as deemed necessary by the treating Practitioner. I acknowledge and consent to receive the Services by the Practitioner via telemedicine. I understand that the telemedicine visit will involve communicating with the Practitioner through live audiovisual communication technology and the disclosure of certain medical information by electronic transmission. I acknowledge that I have been given the opportunity to request an in-person assessment or other available alternative prior to the telemedicine visit and am voluntarily participating in the telemedicine  visit.  I understand that I have the right to withhold or withdraw my consent to the use of telemedicine in the course of my care at any time, without affecting my right to future care or treatment, and that the Practitioner or I may terminate the telemedicine visit at any time. I understand that I have the right to inspect all information obtained and/or recorded in the course of the telemedicine visit and may receive copies of available information for a reasonable fee.  I understand that some of the potential risks of receiving the Services via telemedicine include:   Delay or interruption in medical evaluation due to technological equipment failure or disruption;  Information transmitted may not be sufficient (e.g. poor resolution of images) to allow for appropriate medical decision making by the Practitioner; and/or   In rare instances, security protocols could fail, causing a breach of personal health information.  Furthermore, I acknowledge that it is my responsibility to provide information about my medical history, conditions and care that is complete and accurate to the best of my ability.  I acknowledge that Practitioner's advice, recommendations, and/or decision may be based on factors not within their control, such as incomplete or inaccurate data provided by me or distortions of diagnostic images or specimens that may result from electronic transmissions. I understand that the practice of medicine is not an exact science and that Practitioner makes no warranties or guarantees regarding treatment outcomes. I acknowledge that I will receive a copy of this consent concurrently upon execution via email to the email address I last provided but may also request a printed copy by calling the office of [facility].    I understand that my insurance will be billed for this visit.   I have read or had this consent read to me.  I understand the contents of this consent, which adequately explains  the benefits and risks of the Services being provided via telemedicine.   I have been provided ample opportunity to ask questions regarding this consent and the Services and have had my questions answered to my satisfaction.  I give my informed consent for the services to be provided through the use of telemedicine in my medical care  By participating in this telemedicine visit I agree to the above.

## 2019-03-17 ENCOUNTER — Telehealth (INDEPENDENT_AMBULATORY_CARE_PROVIDER_SITE_OTHER): Payer: Medicare HMO | Admitting: Internal Medicine

## 2019-03-17 ENCOUNTER — Other Ambulatory Visit: Payer: Self-pay

## 2019-03-17 ENCOUNTER — Telehealth: Payer: Self-pay | Admitting: Internal Medicine

## 2019-03-17 ENCOUNTER — Ambulatory Visit (INDEPENDENT_AMBULATORY_CARE_PROVIDER_SITE_OTHER): Payer: Medicare HMO | Admitting: *Deleted

## 2019-03-17 DIAGNOSIS — I5022 Chronic systolic (congestive) heart failure: Secondary | ICD-10-CM | POA: Diagnosis not present

## 2019-03-17 DIAGNOSIS — R0600 Dyspnea, unspecified: Secondary | ICD-10-CM

## 2019-03-17 DIAGNOSIS — I442 Atrioventricular block, complete: Secondary | ICD-10-CM

## 2019-03-17 DIAGNOSIS — Z950 Presence of cardiac pacemaker: Secondary | ICD-10-CM

## 2019-03-17 LAB — CUP PACEART REMOTE DEVICE CHECK
Battery Remaining Longevity: 9 mo
Battery Voltage: 2.86 V
Brady Statistic AP VP Percent: 1.51 %
Brady Statistic AP VS Percent: 0.01 %
Brady Statistic AS VP Percent: 95.27 %
Brady Statistic AS VS Percent: 3.21 %
Brady Statistic RA Percent Paced: 1.52 %
Brady Statistic RV Percent Paced: 95.55 %
Date Time Interrogation Session: 20200528143121
Implantable Lead Implant Date: 19971017
Implantable Lead Implant Date: 19971017
Implantable Lead Implant Date: 20130520
Implantable Lead Location: 753858
Implantable Lead Location: 753859
Implantable Lead Location: 753860
Implantable Lead Model: 4196
Implantable Pulse Generator Implant Date: 20130520
Lead Channel Impedance Value: 228 Ohm
Lead Channel Impedance Value: 247 Ohm
Lead Channel Impedance Value: 266 Ohm
Lead Channel Impedance Value: 285 Ohm
Lead Channel Impedance Value: 361 Ohm
Lead Channel Impedance Value: 418 Ohm
Lead Channel Impedance Value: 418 Ohm
Lead Channel Impedance Value: 456 Ohm
Lead Channel Impedance Value: 665 Ohm
Lead Channel Pacing Threshold Amplitude: 0.5 V
Lead Channel Pacing Threshold Amplitude: 0.75 V
Lead Channel Pacing Threshold Amplitude: 1.375 V
Lead Channel Pacing Threshold Pulse Width: 0.4 ms
Lead Channel Pacing Threshold Pulse Width: 0.4 ms
Lead Channel Pacing Threshold Pulse Width: 0.4 ms
Lead Channel Sensing Intrinsic Amplitude: 21.25 mV
Lead Channel Sensing Intrinsic Amplitude: 4.5 mV
Lead Channel Setting Pacing Amplitude: 1.75 V
Lead Channel Setting Pacing Amplitude: 2 V
Lead Channel Setting Pacing Amplitude: 2 V
Lead Channel Setting Pacing Pulse Width: 0.4 ms
Lead Channel Setting Pacing Pulse Width: 0.4 ms
Lead Channel Setting Sensing Sensitivity: 0.9 mV

## 2019-03-17 NOTE — Telephone Encounter (Signed)
Informed that device battery life is 9 months from ERI and that monthly remote battery checks have been scheduled.

## 2019-03-17 NOTE — Telephone Encounter (Signed)
Follow Up: ° ° ° ° °Returning a call from today. °

## 2019-03-17 NOTE — Progress Notes (Signed)
Electrophysiology TeleHealth Note   Due to national recommendations of social distancing due to COVID 19, an audio/video telehealth visit is felt to be most appropriate for this patient at this time.  See MyChart message from today for the patient's consent to telehealth for [facility].   Date:  03/17/2019   ID:  Makayla Vasquez, DOB 1981-09-16, MRN [id number]  Location: patient's home  Provider location: [address], [place]  Evaluation Performed: Follow-up visit  PCP:  Wendee Beavers, DO  Cardiologist:  No primary care provider on file. [place]. Electrophysiologist:  Dr Ladona Ridgel  Chief Complaint:  "I'm doing ok. "  History of Present Illness:    Makayla Vasquez is a 38 y.o. female who presents via audio/video conferencing for a telehealth visit today. She has a h/o chronic systolic heart  Since last being seen in our clinic, the patient reports doing very well.  Today, she denies symptoms of palpitations, chest pain, shortness of breath,  lower extremity edema, dizziness, presyncope, or syncope.  The patient is otherwise without complaint today.  The patient denies symptoms of fevers, chills, cough, or new SOB worrisome for COVID 19.  Past Medical History:  Diagnosis Date  . Anxiety   . Arthritis    rheumatoid arthritis- mild, no rheumatology care   . Asthma    seasonal allergies   . Asymptomatic LV dysfunction    a. Echo in Dec 2011 with EF 35 to 40%. Felt to be due to paced rhythm. b. EF 25-30% in 07/2014.  . Bipolar affective disorder (HCC)   . Cardiac pacemaker    a. Since age 48 in 24. b. Upgrade to BiV in 2013.  [name] Carpal tunnel syndrome of right wrist   . CHF (congestive heart failure) (HCC)   . Congenital complete AV block   . Cor pulmonale, chronic (HCC) 04/08/2018  . Depression    bipolar  . Diabetes mellitus without complication (HCC)   . GERD (gastroesophageal reflux disease)   . Hypertension   . Interstitial lung disease (HCC)   .  Lupus (HCC)   . Lupus (systemic lupus erythematosus) (HCC)   . Obesity   . Pneumonitis    a. a/w hypoxia - inflammatory - large workup 07/2014.  . Presence of permanent cardiac pacemaker   . Seizures (HCC)    as a child- from high fever  . Sinus tachycardia     Past Surgical History:  Procedure Laterality Date  . ATRIAL TACH ABLATION N/A 08/14/2014   Procedure: ATRIAL TACH ABLATION;  Surgeon: Marinus Maw, MD;  Location: [facility] CATH LAB;  Service: Cardiovascular;  Laterality: N/A;  . BI-VENTRICULAR PACEMAKER UPGRADE N/A 03/08/2012   Procedure: BI-VENTRICULAR PACEMAKER UPGRADE;  Surgeon: Marinus Maw, MD;  Location: [facility] CATH LAB;  Service: Cardiovascular;  Laterality: N/A;  . CARPAL TUNNEL WITH CUBITAL TUNNEL Right 07/26/2013   Procedure: RIGHT LIMITED OPEN CARPAL TUNNEL RELEASE ,  RIGHT CUBITAL TUNNEL RELEASE, INSITU VERSES ULNAR NERVE DECOMPRESSION AND ANTERIOR TRANSPOSITION;  Surgeon: Dominica Severin, MD;  Location: [facility] OR;  Service: Orthopedics;  Laterality: Right;  . CESAREAN SECTION    . CHOLECYSTECTOMY    . CYST EXCISION  12/10/2012   THYROID  . INSERT / REPLACE / REMOVE PACEMAKER     2001  . IUD REMOVAL  11/03/2011   Procedure: INTRAUTERINE DEVICE (IUD) REMOVAL;  Surgeon: Myra C. Marice Potter, MD;  Location: [facility] ORS;  Service: Gynecology;  Laterality: N/A;  . NASAL FRACTURE SURGERY     /  w plate   . RIGHT HEART CATHETERIZATION N/A 10/26/2014   Procedure: RIGHT HEART CATH;  Surgeon: Dolores Patty, MD;  Location: [facility] CATH LAB;  Service: Cardiovascular;  Laterality: N/A;  . THROAT SURGERY  1994   s/p laser treatment  . THYROGLOSSAL DUCT CYST N/A 12/10/2012   Procedure: REVISION OF THYROGLOSSAL DUCT CYST EXCISION;  Surgeon: Serena Colonel, MD;  Location: [facility] OR;  Service: ENT;  Laterality: N/A;  Revision of Thyroglossal Duct Cyst Excision  . VIDEO BRONCHOSCOPY Bilateral 06/19/2014   Procedure: VIDEO BRONCHOSCOPY WITHOUT FLUORO;  Surgeon: Kalman Shan, MD;  Location: [facility] ENDOSCOPY;  Service:  Cardiopulmonary;  Laterality: Bilateral;    Current Outpatient Medications  Medication Sig Dispense Refill  . albuterol (VENTOLIN HFA) 108 (90 Base) MCG/ACT inhaler Inhale 2 puffs into the lungs every 4 (four) hours as needed for wheezing or shortness of breath. 6.7 Inhaler 0  . Alcohol Swabs (B-D SINGLE USE SWABS REGULAR) PADS 1 application by Does not apply route 4 (four) times daily. 120 each 2  . Azelastine-Fluticasone (DYMISTA) 137-50 MCG/ACT SUSP Place 2 sprays into both nostrils 1 day or 1 dose. 1 Bottle 5  . benzonatate (TESSALON) 100 MG capsule Take 1 capsule (100 mg total) by mouth every 8 (eight) hours. 21 capsule 0  . bisoprolol (ZEBETA) 5 MG tablet Take 1 tablet (5 mg total) by mouth daily. (Patient taking differently: Take 5 mg by mouth at bedtime. ) 90 tablet 1  . Blood Glucose Calibration (ACCU-CHEK AVIVA) SOLN 1 application by In Vitro route 4 (four) times daily. 1 each 0  . dextromethorphan-guaiFENesin (ROBITUSSIN-DM) 10-100 MG/5ML liquid TAKE 5 MLS BY MOUTH EVERY 4 (FOUR) HOURS AS NEEDED FOR COUGH. (Patient taking differently: Take 5 mLs by mouth every 4 (four) hours as needed for cough. ) 118 mL 0  . Etonogestrel (NEXPLANON ) Inject 1 Device into the skin once. Every 3 years    . fluticasone (FLONASE) 50 MCG/ACT nasal spray Place 2 sprays into both nostrils daily. 16 g 6  . furosemide (LASIX) 40 MG tablet TAKE 1 TABLET BY MOUTH ONCE DAILY (Patient taking differently: Take 40 mg by mouth daily. ) 30 tablet 5  . ipratropium (ATROVENT) 0.06 % nasal spray 2 sprays in each nostril 3-4 times daily as needed. 15 mL 5  . LANTUS SOLOSTAR 100 UNIT/ML Solostar Pen INJECT 15 UNITS SUBCUTANEOUSLY EVERY DAY (Patient taking differently: Inject 15 Units into the skin daily. ) 15 pen 1  . metFORMIN (GLUCOPHAGE) 1000 MG tablet Take 1 tablet (1,000 mg total) by mouth 2 (two) times daily with a meal. 90 tablet 3  . mycophenolate (CELLCEPT) 500 MG tablet Take 1,000 mg by mouth 2 (two) times daily.      [name] NOVOLOG FLEXPEN 100 UNIT/ML FlexPen INJECT 8 UNITS INTO THE SKIN 3 TIMES DAILY WITH MEALS. (Patient taking differently: Inject 8 Units into the skin 3 (three) times daily with meals. ) 15 pen 4  . omeprazole (PRILOSEC) 20 MG capsule TAKE 1 CAPSULE BY MOUTH EVERY DAY (Patient taking differently: Take 20 mg by mouth daily before breakfast. ) 90 capsule 0  . OXYGEN Inhale 2-4 L into the lungs continuous.     . potassium chloride SA (K-DUR,KLOR-CON) 20 MEQ tablet Take 1 tablet (20 mEq total) by mouth daily. 90 tablet 3  . predniSONE (DELTASONE) 10 MG tablet Take 10 mg by mouth daily with breakfast.    . tacrolimus (PROGRAF) 0.5 MG capsule Take 1.5 mg by mouth 2 (two) times daily.    [name]  traZODone (DESYREL) 50 MG tablet TAKE 1 TABLET (50 MG TOTAL) BY MOUTH AT BEDTIME AS NEEDED FOR SLEEP. 90 tablet 2   No current facility-administered medications for this visit.     Allergies:   Atorvastatin; Sertraline hcl; and Tape   Social History:  The patient  reports that she quit smoking about 22 years ago. Her smoking use included cigarettes. She has a 0.13 pack-year smoking history. She has never used smokeless tobacco. She reports that she does not drink alcohol or use drugs.   Family History:  The patient's  family history includes Heart disease in her father and mother; Heart disease (age of onset: 6434) in her sister; Hypertension in her mother; Lupus in her mother.   ROS:  Please see the history of present illness.   All other systems are personally reviewed and negative.    Exam:    Vital Signs:  P - 95, BP - 97/77, oxygen sat on 4 liters - 97%  Well appearing, alert and conversant, regular work of breathing,  good skin color, wearing oxygen Eyes- anicteric, neuro- grossly intact, skin- no apparent rash or lesions or cyanosis, mouth- oral mucosa is pink   Labs/Other Tests and Data Reviewed:    Recent Labs: 10/24/2018: Magnesium 1.2 02/24/2019: ALT 9; B Natriuretic Peptide 132.5; BUN 9;  Creatinine, Ser 0.82; Hemoglobin 9.2; Platelets 265; Potassium 4.3; Sodium 138   Wt Readings from Last 3 Encounters:  02/24/19 147 lb (66.7 kg)  02/04/19 147 lb (66.7 kg)  12/27/18 137 lb (62.1 kg)     Other studies personally reviewed: Additional studies/ records that were reviewed today include: Last device remote is reviewed from [employer] PDF dated 12/17/18 which reveals normal device function, no arrhythmias    ASSESSMENT & PLAN:    1.  CHB - she is stable s/p PPM insertion. 2. PPM - her medtronic DDD PM is working normally. 3. Chronic systolic heart failure - her symptoms are class 2. She will continue her currentmeds. 4. COVID 19 screen The patient denies symptoms of COVID 19 at this time.  The importance of social distancing was discussed today.  Follow-up:  6 months Next remote: 3 months  Current medicines are reviewed at length with the patient today.   The patient does not have concerns regarding her medicines.  The following changes were made today:  none  Labs/ tests ordered today include: none No orders of the defined types were placed in this encounter.    Patient Risk:  after full review of this patients clinical status, I feel that they are at moderate risk at this time.  Today, I have spent 15 minutes with the patient with telehealth technology discussing all of the above.    Signed, Lewayne BuntingGregg Jamileth Putzier, MD  03/17/2019 12:26 PM     [facility] [address] [place] [address] [phone] (office) [phone] (fax)

## 2019-03-17 NOTE — Telephone Encounter (Signed)
LMOM. Pt to be notified that device 9 months from ERI . Remote monthly battery checks scheduled.

## 2019-03-18 ENCOUNTER — Other Ambulatory Visit: Payer: Self-pay | Admitting: Family Medicine

## 2019-03-18 ENCOUNTER — Other Ambulatory Visit: Payer: Self-pay

## 2019-03-18 MED ORDER — BLOOD GLUCOSE METER KIT: PACK | 0 refills | Status: AC

## 2019-03-18 MED ORDER — ACCU-CHEK AVIVA VI SOLN: 1.0000 "application " | Freq: Four times a day (QID) | 0 refills | Status: AC

## 2019-03-22 ENCOUNTER — Other Ambulatory Visit: Payer: Self-pay

## 2019-03-22 ENCOUNTER — Ambulatory Visit (HOSPITAL_COMMUNITY)
Admission: RE | Admit: 2019-03-22 | Discharge: 2019-03-22 | Disposition: A | Discharge status: Home / Self Care | Payer: Medicare HMO | Source: Ambulatory Visit | Attending: Adult Health | Admitting: Adult Health

## 2019-03-22 ENCOUNTER — Encounter (HOSPITAL_COMMUNITY): Payer: Self-pay | Admitting: *Deleted

## 2019-03-22 ENCOUNTER — Other Ambulatory Visit: Payer: Self-pay | Admitting: Family Medicine

## 2019-03-22 ENCOUNTER — Other Ambulatory Visit (HOSPITAL_COMMUNITY): Payer: Self-pay | Admitting: Cardiology

## 2019-03-22 VITALS — BP 115/83 | Wt 137.0 lb

## 2019-03-22 DIAGNOSIS — I5022 Chronic systolic (congestive) heart failure: Secondary | ICD-10-CM | POA: Diagnosis not present

## 2019-03-22 DIAGNOSIS — J9611 Chronic respiratory failure with hypoxia: Secondary | ICD-10-CM

## 2019-03-22 DIAGNOSIS — R0600 Dyspnea, unspecified: Secondary | ICD-10-CM

## 2019-03-22 DIAGNOSIS — J849 Interstitial pulmonary disease, unspecified: Secondary | ICD-10-CM

## 2019-03-22 NOTE — Progress Notes (Signed)
Heart Failure TeleHealth Note  Due to national recommendations of social distancing due to [place] 19, Audio/video telehealth visit is felt to be most appropriate for this patient at this time.  See MyChart message from today for patient consent regarding telehealth for [facility].  Date:  03/22/2019   ID:  Makayla Vasquez, DOB 1981/01/02, MRN [id number]  Location: Home  Provider location: [place] [facility] Type of Visit: Established patient   PCP:  Watson Bing, DO  Cardiologist:  No primary care provider on file. Primary HF: Dr Aundra Dubin  Pulmonary: Dr Annamaria Boots   Chief Complaint: Heart Failure   History of Present Illness: Makayla Vasquez is a 38 y.o. 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  She has a h/o congenital HB and underwent first PPM at age 89. In 2011 had ECHO with EF 35-40%. LV dysfunction felt to be due to RV pacing so upgraded to CRT-P. Echo in 10/15 with EF 25%. She also has concomitant lung disease and this has limited b-blocker dosing.  She follows with Dr. Annamaria Boots for her lung disease. She is a former smoker. Admitted in 8/15 and 9/15 with worsening respiratory status and severe hypoxia. Had extensive w/u. Thought to have a viral pneumonitis complicated by atelectasis and mild edema vs. an autoimmune process. Discharged home on a course of levaquin and prednisone.   She was offered a lung biopsy for further diagnosis versus empiric treatment with steroids. She opted for empiric steroid treatment. She is followed by a rheumatologist and is on Cellcept, prednisone, and tacrolimus. She is on oxygen and night and with activity.   Admission in 5/17 for ?sepsis and management of interstitial lung disease.   She was admitted with PNA in 1/18. Lasix, losartan, and bisoprolol stopped.   Echo in1/19 showed EF  45-50%, with mild to moderately decreased RV systolic function and moderate MR.Echo was done today and reviewed, EF 35-40% with normal RV size and mildly decreased RV systolic function.   She presents via Engineer, civil (consulting) for a telehealth visit today.   Overall feeling fair. She has an ongoing cough. She remains SOB with exertion. She remains on 2-4 liters oxygen. Denies PND/Orthopnea. Appetite ok. No fever or chills. Weight at home 137 pounds. Taking all medications.  she denies symptoms worrisome for COVID 19.   Past Medical History:  Diagnosis Date  . Anxiety   . Arthritis    rheumatoid arthritis- mild, no rheumatology care   . Asthma    seasonal allergies   . Asymptomatic LV dysfunction    a. Echo in Dec 2011 with EF 35 to 40%. Felt to be due to paced rhythm. b. EF 25-30% in 07/2014.  . Bipolar affective disorder ([place])   . Cardiac pacemaker    a. Since age 89 in 57. b. Upgrade to BiV in 2013.  [name] Carpal tunnel syndrome of right wrist   . CHF (congestive heart failure) ([place])   . Congenital complete AV block   . Cor pulmonale, chronic ([place]) 04/08/2018  . Depression    bipolar  . Diabetes mellitus without complication ([place])   . GERD (gastroesophageal reflux disease)   . Hypertension   . Interstitial lung disease ([place])   . Lupus ([place])   . Lupus (systemic lupus erythematosus) ([place])   . Obesity   . Pneumonitis    a. a/w  hypoxia - inflammatory - large workup 07/2014.  . Presence of permanent cardiac pacemaker   . Seizures ([place])    as a child- from high fever  . Sinus tachycardia    Past Surgical History:  Procedure Laterality Date  . ATRIAL TACH ABLATION N/A 08/14/2014   Procedure: ATRIAL TACH ABLATION;  Surgeon: [name], MD;  Location[facility] CATH LAB;  Service: Cardiovascular;  Laterality: N/A;  . BI-VENTRICULAR PACEMAKER UPGRADE N/A 03/08/2012   Procedure: BI-VENTRICULAR PACEMAKER UPGRADE;  Surgeon: [name], MD;  Location: [facility] CATH LAB;  Service:  Cardiovascular;  Laterality: N/A;  . CARPAL TUNNEL WITH CUBITAL TUNNEL Right 07/26/2013   Procedure: RIGHT LIMITED OPEN CARPAL TUNNEL RELEASE ,  RIGHT CUBITAL TUNNEL RELEASE, INSITU VERSES ULNAR NERVE DECOMPRESSION AND ANTERIOR TRANSPOSITION;  Surgeon: [name], MD;  Location: [facility];  Service: Orthopedics;  Laterality: Right;  . CESAREAN SECTION    . CHOLECYSTECTOMY    . CYST EXCISION  12/10/2012   THYROID  . INSERT / REPLACE / REMOVE PACEMAKER     2001  . IUD REMOVAL  11/03/2011   Procedure: INTRAUTERINE DEVICE (IUD) REMOVAL;  Surgeon: [name], MD;  Location: [facility]S;  Service: Gynecology;  Laterality: N/A;  . NASAL FRACTURE SURGERY     /w plate   . RIGHT HEART CATHETERIZATION N/A 10/26/2014   Procedure: RIGHT HEART CATH;  Surgeon: [name], MD;  Location: [facility] CATH LAB;  Service: Cardiovascular;  Laterality: N/A;  . THROAT SURGERY  1994   s/p laser treatment  . THYROGLOSSAL DUCT CYST N/A 12/10/2012   Procedure: REVISION OF THYROGLOSSAL DUCT CYST EXCISION;  Surgeon: Makayla Gala, MD;  Location: [place];  Service: ENT;  Laterality: N/A;  Revision of Thyroglossal Duct Cyst Excision  . VIDEO BRONCHOSCOPY Bilateral 06/19/2014   Procedure: VIDEO BRONCHOSCOPY WITHOUT FLUORO;  Surgeon: Brand Males, MD;  Location: [facility];  Service: Cardiopulmonary;  Laterality: Bilateral;     Current Outpatient Medications  Medication Sig Dispense Refill  . albuterol (VENTOLIN HFA) 108 (90 Base) MCG/ACT inhaler Inhale 2 puffs into the lungs every 4 (four) hours as needed for wheezing or shortness of breath. 6.7 Inhaler 0  . Alcohol Swabs (B-D SINGLE USE SWABS REGULAR) PADS 1 application by Does not apply route 4 (four) times daily. 120 each 2  . Azelastine-Fluticasone (DYMISTA) 137-50 MCG/ACT SUSP Place 2 sprays into both nostrils 1 day or 1 dose. 1 Bottle 5  . benzonatate (TESSALON) 100 MG capsule Take 1 capsule (100 mg total) by mouth every 8 (eight) hours. 21 capsule 0  . bisoprolol  (ZEBETA) 5 MG tablet Take 1 tablet (5 mg total) by mouth daily. (Patient taking differently: Take 5 mg by mouth at bedtime. ) 90 tablet 1  . Blood Glucose Calibration (ACCU-CHEK AVIVA) SOLN 1 application by In Vitro route 4 (four) times daily. 1 each 0  . blood glucose meter kit and supplies Dispense based on patient and insurance preference. Use up to four times daily as directed. (FOR ICD-10 E10.9, E11.9). 1 each 0  . dextromethorphan-guaiFENesin (ROBITUSSIN-DM) 10-100 MG/5ML liquid TAKE 5 MLS BY MOUTH EVERY 4 (FOUR) HOURS AS NEEDED FOR COUGH. (Patient taking differently: Take 5 mLs by mouth every 4 (four) hours as needed for cough. ) 118 mL 0  . Etonogestrel (NEXPLANON [place]) Inject 1 Device into the skin once. Every 3 years    . fluticasone (FLONASE) 50 MCG/ACT nasal spray Place 2 sprays into both nostrils daily. 16 g 6  .  furosemide (LASIX) 40 MG tablet TAKE 1 TABLET BY MOUTH ONCE DAILY (Patient taking differently: Take 40 mg by mouth daily. ) 30 tablet 5  . ipratropium (ATROVENT) 0.06 % nasal spray 2 sprays in each nostril 3-4 times daily as needed. 15 mL 5  . LANTUS SOLOSTAR 100 UNIT/ML Solostar Pen INJECT 15 UNITS SUBCUTANEOUSLY EVERY DAY (Patient taking differently: Inject 15 Units into the skin daily. ) 15 pen 1  . metFORMIN (GLUCOPHAGE) 1000 MG tablet Take 1 tablet (1,000 mg total) by mouth 2 (two) times daily with a meal. 90 tablet 3  . mycophenolate (CELLCEPT) 500 MG tablet Take 1,000 mg by mouth 2 (two) times daily.     [name] NOVOLOG FLEXPEN 100 UNIT/ML FlexPen INJECT 8 UNITS INTO THE SKIN 3 TIMES DAILY WITH MEALS. (Patient taking differently: Inject 8 Units into the skin 3 (three) times daily with meals. ) 15 pen 4  . omeprazole (PRILOSEC) 20 MG capsule TAKE 1 CAPSULE BY MOUTH EVERY DAY (Patient taking differently: Take 20 mg by mouth daily before breakfast. ) 90 capsule 0  . OXYGEN Inhale 2-4 L into the lungs continuous.     . potassium chloride SA (K-DUR,KLOR-CON) 20 MEQ tablet Take 1 tablet  (20 mEq total) by mouth daily. 90 tablet 3  . predniSONE (DELTASONE) 10 MG tablet Take 10 mg by mouth daily with breakfast.    . tacrolimus (PROGRAF) 0.5 MG capsule Take 1.5 mg by mouth 2 (two) times daily.    . traZODone (DESYREL) 50 MG tablet TAKE 1 TABLET (50 MG TOTAL) BY MOUTH AT BEDTIME AS NEEDED FOR SLEEP. 90 tablet 2   No current facility-administered medications for this encounter.     Allergies:   Atorvastatin; Sertraline hcl; and Tape   Social History:  The patient  reports that she quit smoking about 22 years ago. Her smoking use included cigarettes. She has a 0.13 pack-year smoking history. She has never used smokeless tobacco. She reports that she does not drink alcohol or use drugs.   Family History:  The patient's family history includes Heart disease in her father and mother; Heart disease (age of onset: 55) in her sister; Hypertension in her mother; Lupus in her mother.   ROS:  Please see the history of present illness.   All other systems are personally reviewed and negative.   Exam:  Tele Health Call; Exam is subjective  General:  Speaks in full sentences. No resp difficulty. Lungs: Normal respiratory effort with conversation.  Abdomen: Non-distended per patient report Extremities: Pt denies edema. Neuro: Alert & oriented x 3.   Recent Labs: 10/24/2018: Magnesium 1.2 02/24/2019: ALT 9; B Natriuretic Peptide 132.5; BUN 9; Creatinine, Ser 0.82; Hemoglobin 9.2; Platelets 265; Potassium 4.3; Sodium 138  Personally reviewed   Wt Readings from Last 3 Encounters:  03/22/19 62.1 kg (137 lb)  02/24/19 66.7 kg (147 lb)  02/04/19 66.7 kg (147 lb)      ASSESSMENT AND PLAN:   1) Chronic systolic HF: Echo 04/3531 EF 20-25%, echo 1/19 improved with EF 45-50% and mild-moderately decreased systolic function. Echo 11/2018 EF 35-40% with mild RV systolic dysfunction.Nonischemic cardiomyopathy.  -NYHA IIIb chronically.  Continue to home oxygen.  - Volume status stable. Continue  current dose of lasix.  - Continue bisoprolol 5 mg qhs. Would tolerate SBP >/= 85 as long as she is not symptomatic. 2) Anti-synthetase syndrome with interstitial lung disease: This is associated with elevated ant-Jo1 Ab, ILD, and myositis. CK has been persistently elevated. She is  followed by rheumatology and is on Cellcept, tacrolimus, and prednisone.  She has follow up with Dr Annamaria Boots next week.   3) Congenital AVN block s/p pacer:She had follow up with Dr Lovena Le last week.    COVID screen The patient does not have any symptoms that suggest any further testing/ screening at this time.  Social distancing reinforced today.  Patient Risk: After full review of this patients clinical status, I feel that they are at moderate risk for cardiac decompensation at this time.  Relevant cardiac medications were reviewed at length with the patient today. The patient does not have concerns regarding their medications at this time.   The following changes were made today:  nonr Recommended follow-up: Dr Aundra Dubin in 4 months   Today, I have spent 11  minutes with the patient with telehealth technology discussing the above issues .    Jeanmarie Hubert, NP  03/22/2019 10:09 AM  [place] [address] [facility] [place] [address] [phone] (office) [phone] (fax)

## 2019-03-22 NOTE — Progress Notes (Signed)
Sherald Hess, NP  Noralee Space, RN        Follow up with Dr Shirlee Latch in 4 months    Message to schedulers, AVS sent via [facility]

## 2019-03-22 NOTE — Patient Instructions (Signed)
Continue current medications  Your physician recommends that you schedule a follow-up appointment in: 4 months, OUR OFFICE WILL CALL YOU TO SCHEDULE  If you have any questions or concerns before your next appointment please send [place] a message through [place] or call our office at [phone].

## 2019-03-22 NOTE — Addendum Note (Signed)
Encounter addended by: Noralee Space, RN on: 03/22/2019 10:28 AM  Actions taken: Clinical Note Signed

## 2019-03-24 ENCOUNTER — Encounter: Payer: Self-pay | Admitting: Cardiology

## 2019-03-24 NOTE — Progress Notes (Signed)
Remote pacemaker transmission.   

## 2019-03-31 ENCOUNTER — Encounter: Payer: Self-pay | Admitting: Internal Medicine

## 2019-03-31 ENCOUNTER — Ambulatory Visit (INDEPENDENT_AMBULATORY_CARE_PROVIDER_SITE_OTHER): Payer: Medicare HMO | Admitting: Internal Medicine

## 2019-03-31 ENCOUNTER — Other Ambulatory Visit: Payer: Self-pay

## 2019-03-31 VITALS — BP 100/60 | HR 98 | Temp 97.9°F | Ht 65.0 in | Wt 145.6 lb

## 2019-03-31 DIAGNOSIS — D869 Sarcoidosis, unspecified: Secondary | ICD-10-CM

## 2019-03-31 DIAGNOSIS — J9621 Acute and chronic respiratory failure with hypoxia: Secondary | ICD-10-CM | POA: Diagnosis not present

## 2019-03-31 DIAGNOSIS — M3213 Lung involvement in systemic lupus erythematosus: Secondary | ICD-10-CM

## 2019-03-31 DIAGNOSIS — J9611 Chronic respiratory failure with hypoxia: Secondary | ICD-10-CM

## 2019-03-31 MED ORDER — BREO ELLIPTA 100-25 MCG/INH IN AEPB: 1.0000 | INHALATION_SPRAY | Freq: Every day | RESPIRATORY_TRACT | 0 refills | Status: DC

## 2019-03-31 NOTE — Progress Notes (Signed)
Patient seen in the office today and instructed on use of Breo.  Patient expressed understanding and demonstrated technique.  

## 2019-03-31 NOTE — Progress Notes (Signed)
HPI F former smoker originaly referred for allergy evaluation, rhinitis, asthmatic bronchitis, then hosp 2015 for ?ALI with lower zone pneumonitis/ BAL and special studies NEG,. 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 Allergy profile 02/23/2014-negative with total IgE 22.9 HSP panel was neg.  Positive ANA, a speckled positive ANA 1-80 pattern, mildly elevated ESR, non-reactive HIV, and negative PPD EF (25-30%, from baseline 30-25% in 2013).  2015- CXR-bilateral progressive lower lobe peribronchial infiltrates with groundglass changes and nodular pattern consistent with acute pneumonitis of unclear etiology Walk test on room air: 44/22/16: 98%, 92%, 91%, 90% at rest on room air. CT chest 06/28/2015-Severe bilaterally symmetric ground-glass consolidation.  ----------------------------------------------------------------------------------------------------------------  10/06/2018- 37 yoF former smoker originaly referred for allergy evaluation, rhinitis, asthmatic bronchitis, then hosp 2015 for ?ALI with lower zone pneumonitis/ BAL and special studies NEG,. Lupus/ followed at [facility] for anti-synthetase syndrome, ILD, Acute on Chronic respiratory failure/ Hypoxemia,.DM Pacemaker for AV block , Pulm HTN, DM2,  chronic dilated cardiomyopathy O2 2-4  L/[facility]  POC -----For a week  she has had chest and head congestion.  Maintenance prednisone 10 mg daily.  Recently finished amoxicillin.  Now acute illness starting 10 days ago with sudden onset, low-grade fever mild chilling, myalgias, watery phlegm and rhinorrhea.  She has persistent nausea and loose stools. She has a pulse portable oxygen concentrator.  At last visit [facility] was asked to provide her with portable tanks for  continuous flow at 4 L but she never received those. Rapid Flu test Nasal Swab-negative CT chestHR/ 09/21/18- IMPRESSION: 1. Pulmonary parenchymal pattern of nodularity appears to be perilymphatic in distribution and is suspicious for sarcoid. Findings are similar to prior exams. Mediastinal and suspected hilar adenopathy with narrowing of the left mainstem bronchus, as before. 2. Enlarged pulmonary arteries, indicative of pulmonary arterial Hypertension.  03/31/2019- 60 yoF former smoker originaly referred for allergy evaluation, rhinitis, asthmatic bronchitis, then hosp 2015 for ?ALI with lower zone pneumonitis/ BAL and special studies NEG,. Lupus/ followed at [facility] for anti-synthetase syndrome, ILD, Acute on Chronic respiratory failure/ Hypoxemia,.DM Pacemaker for AV block , Pulm HTN, DM2,  chronic dilated cardiomyopathy O2 2-4  L/[facility]  POC -----pt states her breathing is as baseline, currently on O2 3L, taking amoxicillin d/t dental work Variable cough. Panic attacks. Tussive mid-chest pains, non-exertional. CTa x2 neg for PE. CT ?'d Sarcoid- (possibly Lupus changes) iOnly occ rescue albuterol. CTachest PE- 02/05/2019-  IMPRESSION: No evidence of pulmonary emboli. 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 No new focal abnormality is seen.  ROS-see HPI   + = positive Constitutional:   + weight loss, night sweats, +fevers, + chills, fatigue, lassitude. HEENT:   , difficulty swallowing, tooth/dental problems, sore throat,       sneezing, itching, ear ache, nasal congestion, + post nasal drip,  CV:  + chest pain, orthopnea, PND, swelling in lower extremities, anasarca,                                                    dizziness, palpitations Resp: + shortness of breath with exertion or at rest.             +  productive cough, + non-productive cough,  No- coughing up of blood.               No- wheezing.   Skin: No-   rash or  lesions. GI:   heartburn, indigestion, abdominal pain, nausea, vomiting,  GU:  MS:  No-   joint pain or swelling.   Neuro-     nothing unusual Psych:  No- change in mood or affect. No depression or anxiety.  No memory loss.  OBJ- Physical Exam  96% sat on O2 4L General- Alert, Oriented, Affect-appropriate, +wearing mask  Skin- +acanthotic hyperpigmentation around neck, +icthyotic scaling on calves Lymphadenopathy- none Head- atraumatic            Eyes- Gross vision intact, PERRLA, conjunctivae and secretions clear            Ears- Hearing, canals-normal            Nose-  no-Septal dev, mucus, polyps, erosion, perforation + snffling            Throat- Mallampati III , mucosa clear , drainage- none, tonsils- atrophic Neck- flexible , trachea midline, no stridor , thyroid nl, carotid no bruit Chest - symmetrical excursion , unlabored           Heart/CV- RRR , no murmur , no gallop  , no rub, nl s1 s2                           - JVD- none , edema- none, stasis changes- none, varices- none           Lung- +few crackles in bases, wheeze+ R apex, cough+  , dullness-none, rub- none. Deep breath > cough           Chest wall-  + pacemaker Abd-  Br/ Gen/ Rectal- Not done, not indicated Extrem- cyanosis- none, clubbing, none, atrophy- none, strength- nl Neuro- grossly intact to observation

## 2019-03-31 NOTE — Patient Instructions (Addendum)
Sample Breo maintenance inhaler     Inhale 1 puff, then rinse mouth, once daily   Try this for a week and see if it helps with cough and shortness of breath  Order- DME [facility]- please evaluate for lighter portable O2 2-4 L/ min, POC or tank    Dx chronic respiratory failure with hypoxia  Order lab- Angiotensin Converting Enzyme level (ACE)   Dx Sarcoid  Please call as needed

## 2019-04-01 LAB — ANGIOTENSIN CONVERTING ENZYME: Angiotensin-Converting Enzyme: 33 U/L (ref 9–67)

## 2019-04-03 IMAGING — DX DG CHEST 2V
2 series · 2 of 2 positions shown · non-contrast
Comparison: CT 01/05/2017, CT 02/22/2016

CLINICAL DATA: Increased short of breath, cough and congestion

EXAM:
CHEST  2 VIEW

[chest pa]
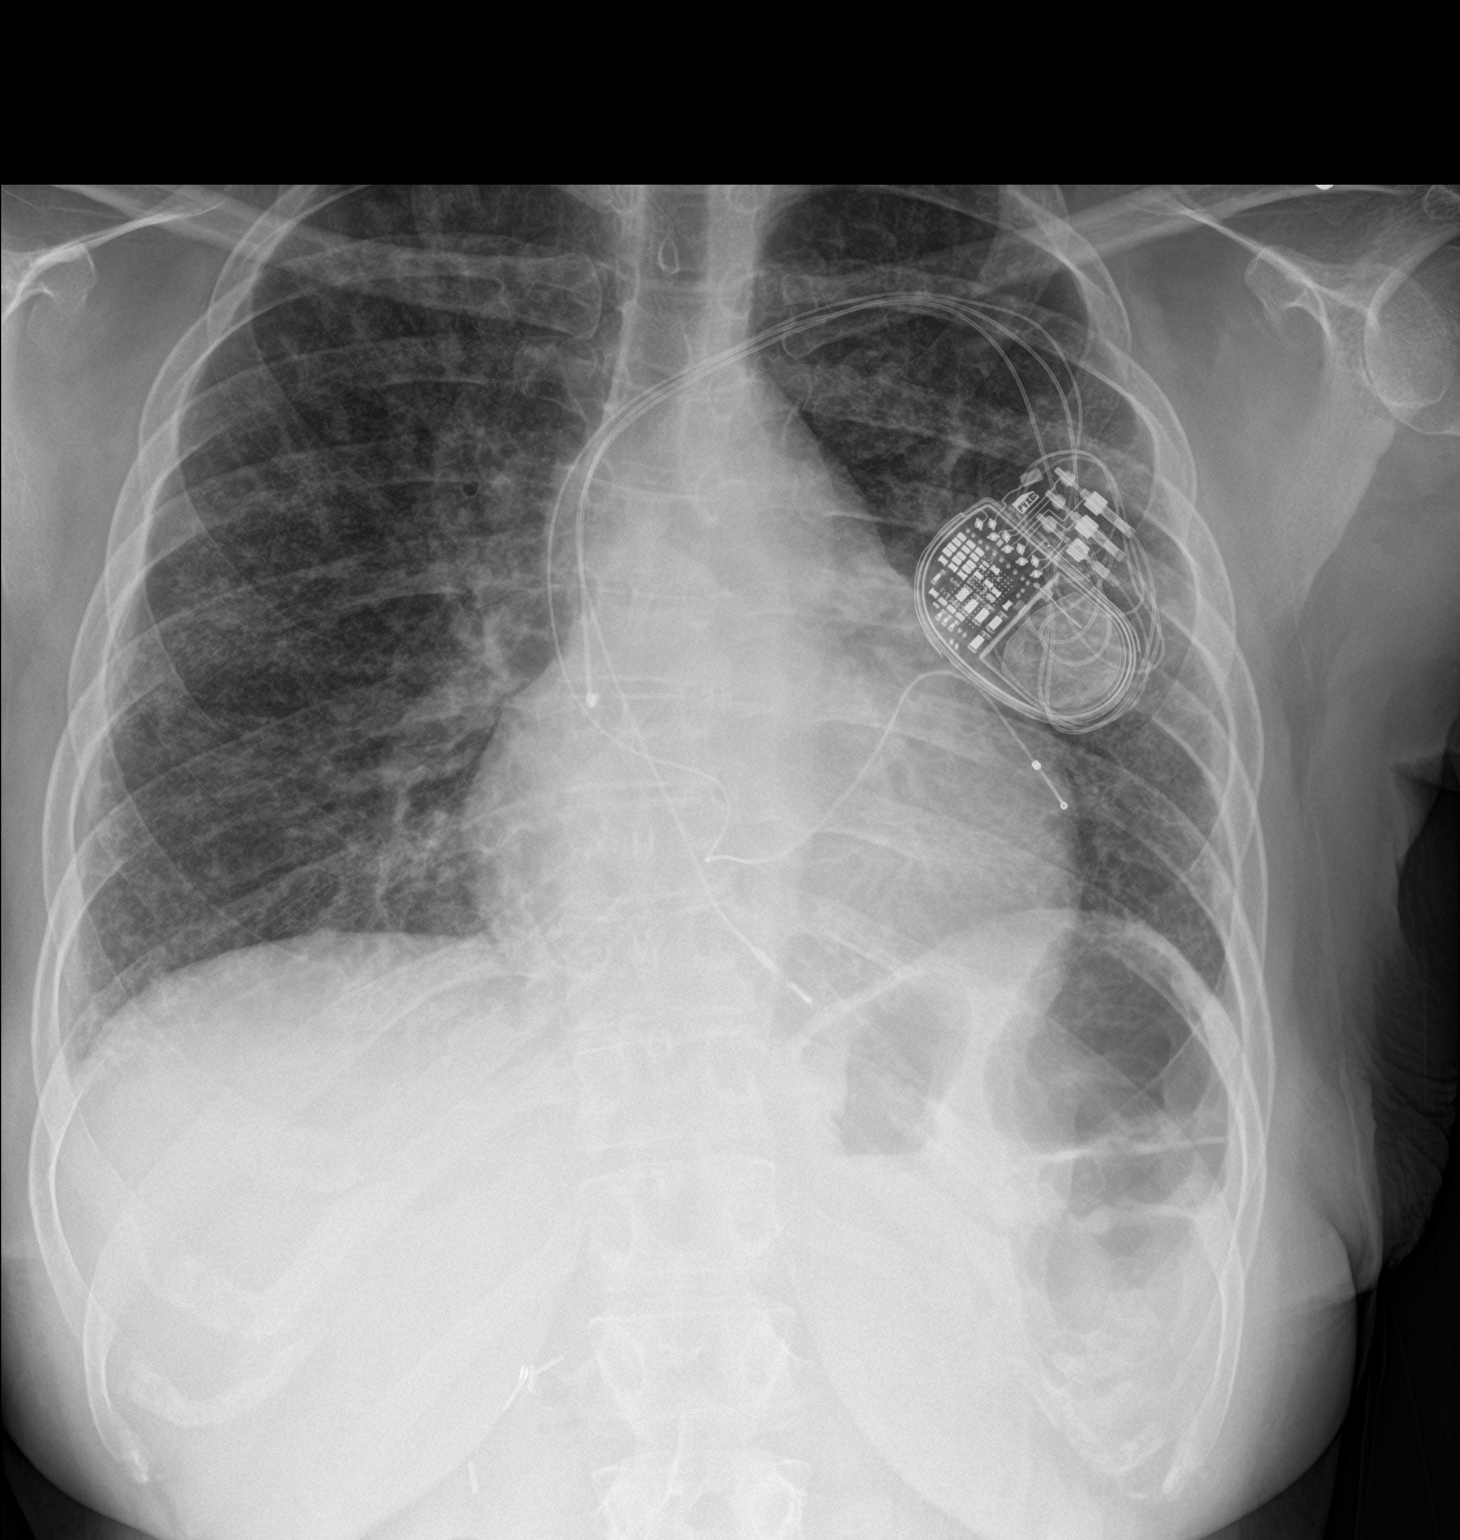

[chest lat]
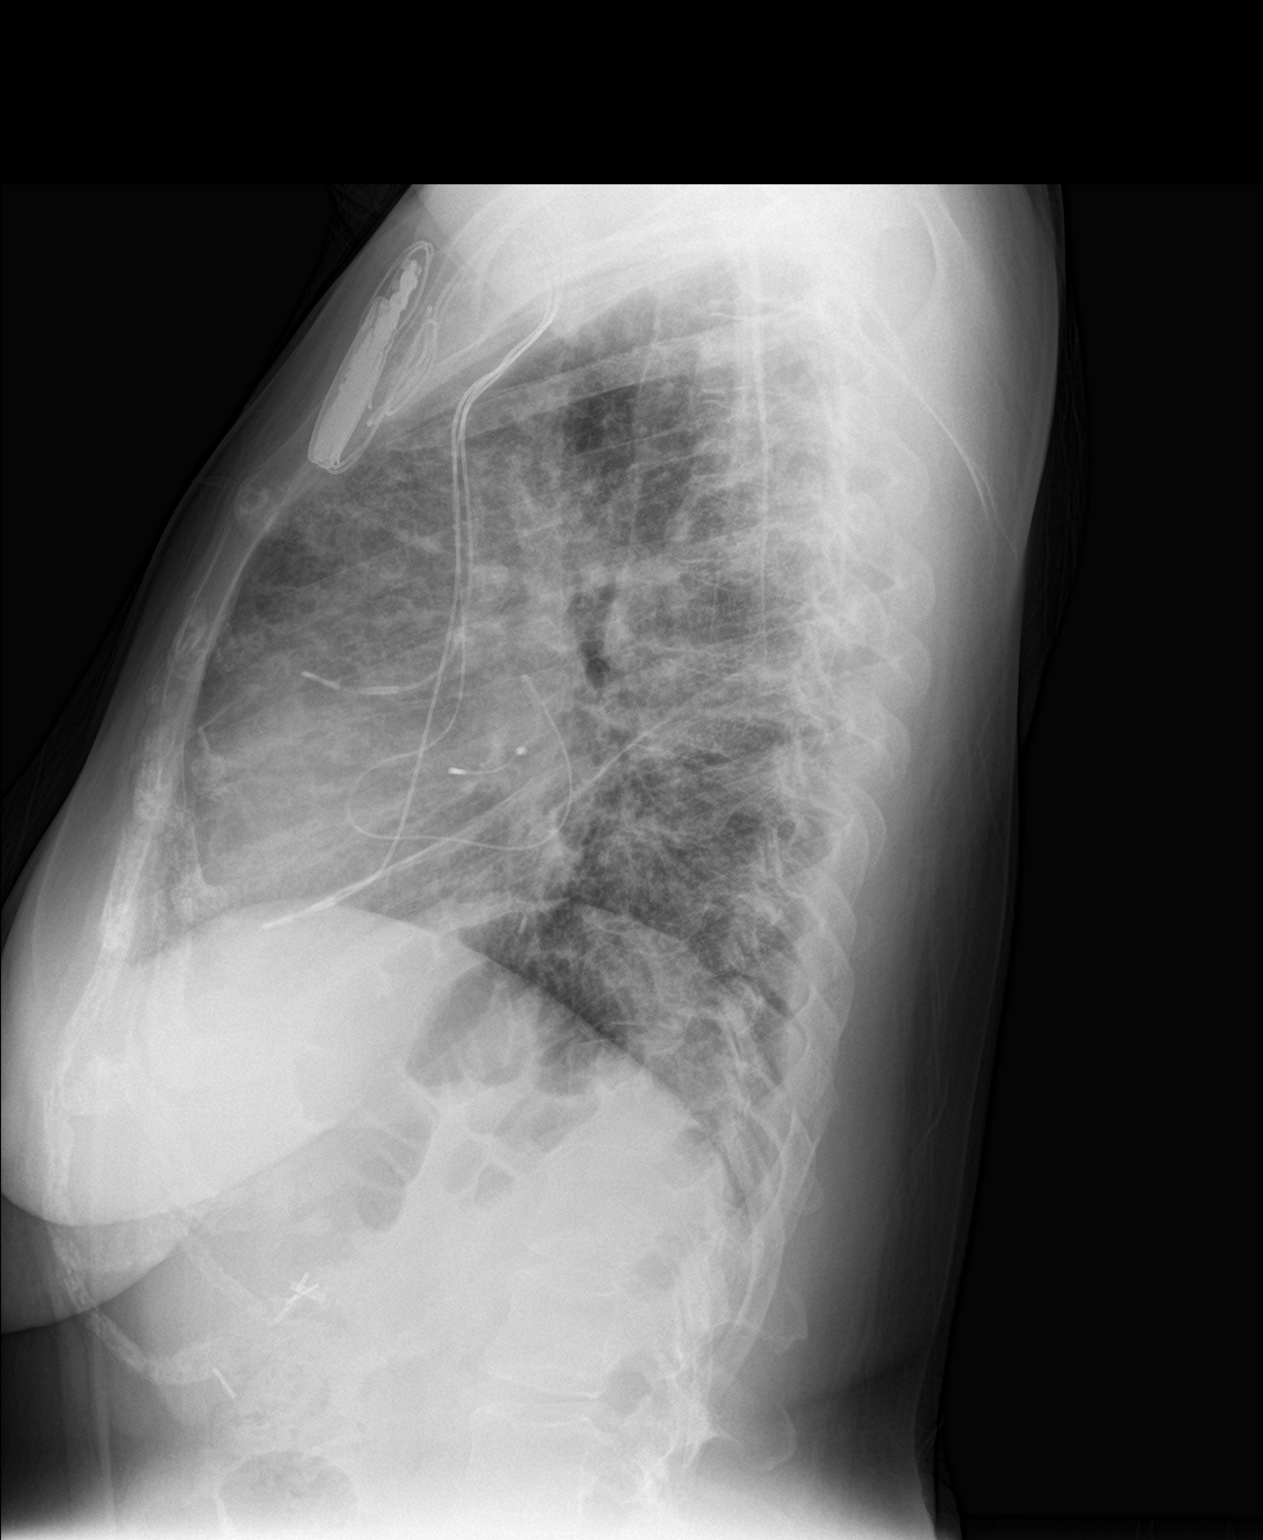

[2 of 2 positions shown; findings below may reference images not displayed]

FINDINGS: LEFT-sided pacemaker with multiple continuous leads overlies normal
cardiac silhouette. There is fine reticular nodular pattern
throughout the lungs similar to comparison CT. No focal infiltrate.
No pneumothorax.
IMPRESSION: 1. No acute findings.
2. Fine reticulonodular pattern throughout the lungs corresponds to
comparison CT exams.

## 2019-04-12 ENCOUNTER — Encounter: Payer: Medicare Other | Admitting: Internal Medicine

## 2019-04-18 ENCOUNTER — Ambulatory Visit (INDEPENDENT_AMBULATORY_CARE_PROVIDER_SITE_OTHER): Payer: Medicare HMO | Admitting: *Deleted

## 2019-04-18 DIAGNOSIS — I5022 Chronic systolic (congestive) heart failure: Secondary | ICD-10-CM

## 2019-04-18 DIAGNOSIS — Q246 Congenital heart block: Secondary | ICD-10-CM

## 2019-04-19 ENCOUNTER — Telehealth: Payer: Self-pay | Admitting: *Deleted

## 2019-04-19 ENCOUNTER — Telehealth (INDEPENDENT_AMBULATORY_CARE_PROVIDER_SITE_OTHER): Payer: Medicare HMO | Admitting: Family Medicine

## 2019-04-19 ENCOUNTER — Other Ambulatory Visit: Payer: Self-pay | Admitting: *Deleted

## 2019-04-19 ENCOUNTER — Telehealth: Payer: Self-pay

## 2019-04-19 ENCOUNTER — Ambulatory Visit: Payer: Medicare HMO | Admitting: Internal Medicine

## 2019-04-19 ENCOUNTER — Other Ambulatory Visit: Payer: Self-pay

## 2019-04-19 DIAGNOSIS — Z20822 Contact with and (suspected) exposure to covid-19: Secondary | ICD-10-CM

## 2019-04-19 DIAGNOSIS — J4541 Moderate persistent asthma with (acute) exacerbation: Secondary | ICD-10-CM

## 2019-04-19 MED ORDER — PREDNISONE 20 MG PO TABS: 40.0000 mg | ORAL_TABLET | Freq: Every day | ORAL | 0 refills | Status: AC

## 2019-04-19 MED ORDER — FLUTICASONE PROPIONATE 50 MCG/ACT NA SUSP: 2.0000 | Freq: Every day | NASAL | 6 refills | Status: DC

## 2019-04-19 NOTE — Telephone Encounter (Signed)
LM to call back @ [phone] M-F 7a-7p to schedule covid testing. Order placed  

## 2019-04-19 NOTE — Assessment & Plan Note (Signed)
Acute exacerbation: -Plan to treat symptoms multifactorially. -She has fairly persistent dry cough per her report.  With chills, body aches, worsening of cough and recent travel to beach, will obtain covid-19 testing. -Cough non-productive. No LE edema or weight gain suggestive of CHF.  -Plan to treat as asthma exacerbation with prednisone burst for 5 days.  Will use Flonase for sinus symptoms. -Follow-up with [place] by phone or in person if she does not have any further improvement. -Patient agreed with plan and appreciative of call - staff message placed for COVID 19 drive up testing, patient to social distance, wash hands and wear a mask

## 2019-04-19 NOTE — Assessment & Plan Note (Deleted)
Acute exacerbation: -Plan to treat symptoms multifactorially. -She has fairly persistent dry cough per her report.  With chills, body aches, worsening of cough and recent travel to beach, will obtain covid-19 testing. -Cough non-productive. No LE edema or weight gain suggestive of CHF.  -Plan to treat as asthma exacerbation with prednisone burst for 5 days.  Will use Flonase for sinus symptoms. -Follow-up with [place] by phone or in person if she does not have any further improvement. -Patient agreed with plan and appreciative of call

## 2019-04-19 NOTE — Telephone Encounter (Signed)
-----   Message from Martinique Shirley, DO sent at 04/19/2019 10:32 AM EDT ----- COVID Drive-Up Test Referral Criteria  Patient age: 38 y.o.  Symptoms: Cough, Shortness of breath, Chills, Muscle pain, and Sore throat  Underlying Conditions: Heart failure, HTN, and Cancer, transplant, HIV with low CD4, prolonged steroid use or other immune deficiency  Is the patient a first responder? No  Does the patient live or work in a high risk or high density environment: No  Is the patient a COVID convalescent patient who is 14-28 days symptom-free and interested in donating plasma for use as a therapeutic product? No

## 2019-04-19 NOTE — Telephone Encounter (Signed)
Spoke with patient to remind of missed remote transmission 

## 2019-04-19 NOTE — Progress Notes (Addendum)
[place] Telemedicine Visit  Patient consented to have virtual visit. Method of visit: Video was attempted, but technology challenges prevented patient from using video, so visit was conducted via telephone.  Encounter participants: Patient: Makayla Vasquez - located at home Provider: [place] Gabino Hagin - located at home Others (if applicable): n/a  Chief Complaint: cough and drainage with some chills and muscle aches  HPI:  Patient has been complaining of cough as well as sinus drainage and congestion. Cough is worse when her air conditioner comes on. The other symptoms have been present for 4 days, and states that her symptoms from April never completely resolved. Patient went to the beach, but not into the water, she has been back for 1 week. She has had chills, no fevers, no worse shortness of breath- patient's normal.  She has had some sore throat like its dry, thinks its due to the cough. Body has also been aching, and that has been happening for 3 days. She has no known contacts to covid, but states her son did go to [employer]. She has not tried albuterol.   No fevers. No abdominal pain. No nausea or vomiting. No undue fatigue. Does use O2 chronically and has been using 3 L.  Her baseline is 2 to 4 L. She is also on 10mg  prednisone daily. She had no lower extremity edema and denies any weight gain. Cough is dry nonproductive.   ROS: per HPI  Pertinent PMHx: history of chronic respiratory failure and chronic heart failure as well as anti-sythetase syndrome for which she is followed by Rheumatology in [place].  Exam:  Respiratory: able to talk in full sentences  Assessment/Plan:  Moderate persistent asthma with acute exacerbation Acute exacerbation: -Plan to treat symptoms multifactorially. -She has fairly persistent dry cough per her report.  With chills, body aches, worsening of cough and recent travel to beach, will obtain covid-19 testing. -Cough  non-productive. No LE edema or weight gain suggestive of CHF.  -Plan to treat as asthma exacerbation with prednisone burst for 5 days.  Will use Flonase for sinus symptoms. -Follow-up with [place] by phone or in person if she does not have any further improvement. -Patient agreed with plan and appreciative of call - staff message placed for COVID 19 drive up testing, patient to social distance, wash hands and wear a mask   Time spent during visit with patient: 14 minutes

## 2019-04-20 LAB — CUP PACEART REMOTE DEVICE CHECK
Battery Remaining Longevity: 8 mo
Battery Voltage: 2.85 V
Brady Statistic AP VP Percent: 0.56 %
Brady Statistic AP VS Percent: 0.01 %
Brady Statistic AS VP Percent: 92.07 %
Brady Statistic AS VS Percent: 7.37 %
Brady Statistic RA Percent Paced: 0.56 %
Brady Statistic RV Percent Paced: 91.32 %
Date Time Interrogation Session: 20200630161253
Implantable Lead Implant Date: 19971017
Implantable Lead Implant Date: 19971017
Implantable Lead Implant Date: 20130520
Implantable Lead Location: 753858
Implantable Lead Location: 753859
Implantable Lead Location: 753860
Implantable Lead Model: 4196
Implantable Pulse Generator Implant Date: 20130520
Lead Channel Impedance Value: 228 Ohm
Lead Channel Impedance Value: 247 Ohm
Lead Channel Impedance Value: 285 Ohm
Lead Channel Impedance Value: 304 Ohm
Lead Channel Impedance Value: 380 Ohm
Lead Channel Impedance Value: 418 Ohm
Lead Channel Impedance Value: 437 Ohm
Lead Channel Impedance Value: 475 Ohm
Lead Channel Impedance Value: 684 Ohm
Lead Channel Pacing Threshold Amplitude: 0.5 V
Lead Channel Pacing Threshold Amplitude: 0.875 V
Lead Channel Pacing Threshold Amplitude: 1.375 V
Lead Channel Pacing Threshold Pulse Width: 0.4 ms
Lead Channel Pacing Threshold Pulse Width: 0.4 ms
Lead Channel Pacing Threshold Pulse Width: 0.4 ms
Lead Channel Sensing Intrinsic Amplitude: 4.625 mV
Lead Channel Sensing Intrinsic Amplitude: 4.625 mV
Lead Channel Sensing Intrinsic Amplitude: 6.125 mV
Lead Channel Sensing Intrinsic Amplitude: 6.125 mV
Lead Channel Setting Pacing Amplitude: 2 V
Lead Channel Setting Pacing Amplitude: 2 V
Lead Channel Setting Pacing Amplitude: 2 V
Lead Channel Setting Pacing Pulse Width: 0.4 ms
Lead Channel Setting Pacing Pulse Width: 0.4 ms
Lead Channel Setting Sensing Sensitivity: 0.9 mV

## 2019-04-26 ENCOUNTER — Other Ambulatory Visit: Payer: Self-pay | Admitting: *Deleted

## 2019-04-26 DIAGNOSIS — Z20822 Contact with and (suspected) exposure to covid-19: Secondary | ICD-10-CM

## 2019-04-27 NOTE — Addendum Note (Signed)
Addended by: Douglass Rivers D on: 04/27/2019 02:26 PM   Modules accepted: Level of Service

## 2019-04-27 NOTE — Progress Notes (Signed)
Remote pacemaker transmission.   

## 2019-05-02 ENCOUNTER — Other Ambulatory Visit: Payer: Self-pay | Admitting: Internal Medicine

## 2019-05-02 DIAGNOSIS — K219 Gastro-esophageal reflux disease without esophagitis: Secondary | ICD-10-CM

## 2019-05-02 LAB — NOVEL CORONAVIRUS, NAA: SARS-CoV-2, NAA: NOT DETECTED

## 2019-05-11 ENCOUNTER — Telehealth: Payer: Self-pay | Admitting: Family Medicine

## 2019-05-11 NOTE — Telephone Encounter (Signed)
Pt would like to have another referral to [facility].

## 2019-05-12 ENCOUNTER — Ambulatory Visit: Payer: Medicare HMO | Admitting: Family Medicine

## 2019-05-12 ENCOUNTER — Ambulatory Visit (INDEPENDENT_AMBULATORY_CARE_PROVIDER_SITE_OTHER): Payer: Medicare HMO | Admitting: Family Medicine

## 2019-05-12 ENCOUNTER — Other Ambulatory Visit: Payer: Self-pay

## 2019-05-12 ENCOUNTER — Other Ambulatory Visit (HOSPITAL_COMMUNITY)
Admission: RE | Admit: 2019-05-12 | Discharge: 2019-05-12 | Disposition: A | Discharge status: Home / Self Care | Payer: Medicare HMO | Source: Ambulatory Visit | Attending: Family Medicine | Admitting: Family Medicine

## 2019-05-12 VITALS — BP 112/72 | HR 120

## 2019-05-12 DIAGNOSIS — Z113 Encounter for screening for infections with a predominantly sexual mode of transmission: Secondary | ICD-10-CM

## 2019-05-12 LAB — POCT WET PREP (WET MOUNT)
Clue Cells Wet Prep Whiff POC: NEGATIVE
Trichomonas Wet Prep HPF POC: ABSENT

## 2019-05-12 NOTE — Patient Instructions (Signed)
It was great meeting you today!  We performed a wet prep which was negative for yeast, BV, trichomonas.  We will await the gonorrhea and chlamydia tests come back.  Once that test comes back we can discuss options.

## 2019-05-13 ENCOUNTER — Other Ambulatory Visit: Payer: Self-pay | Admitting: Family Medicine

## 2019-05-13 DIAGNOSIS — M25531 Pain in right wrist: Secondary | ICD-10-CM

## 2019-05-13 LAB — CERVICOVAGINAL ANCILLARY ONLY
Chlamydia: NEGATIVE
Neisseria Gonorrhea: NEGATIVE

## 2019-05-13 NOTE — Progress Notes (Signed)
Patient left message requesting referral to [place]

## 2019-05-15 DIAGNOSIS — Z113 Encounter for screening for infections with a predominantly sexual mode of transmission: Secondary | ICD-10-CM | POA: Insufficient documentation

## 2019-05-15 NOTE — Assessment & Plan Note (Signed)
Suspect this is cause of CT changes ?'d as Sarcoid, Plan- ACE level

## 2019-05-15 NOTE — Progress Notes (Signed)
   HPI 38 year old female presents for STD screen.  Patient states that she has had foul odor for the last 1 to 2 weeks.  Has not had any unprotected sexual encounters recently.  She has had no other symptoms, including with itching, purulent discharge, or pain.  CC: STD screening   ROS:   Review of Systems See HPI for ROS.   CC, SH/smoking status, and VS noted  Objective: BP 112/72   Pulse (!) 120   SpO2 21%  Gen: 38-year-old African-American female, no acute distress, resting comfortably CV: RRR, no murmur Resp: CTAB, no wheezes, non-labored Neuro: Alert and oriented, Speech clear, No gross deficits GU: Speculum easily inserted.  No discharge appreciated, no malodor appreciated.   Assessment and plan:  Screen for STD (sexually transmitted disease) Wet prep negative.  GC chlamydia negative.  RPR, HIV, hep C all pending.   Orders Placed This Encounter  Procedures  . HIV antibody (with reflex)  . RPR  . Hepatitis c antibody (reflex)  . POCT Wet Prep [facility])    No orders of the defined types were placed in this encounter.    Guadalupe Dawn MD PGY-3 Family Medicine Resident  05/15/2019 11:53 PM

## 2019-05-15 NOTE — Assessment & Plan Note (Signed)
Continues O2 dependent Plan- [facility] assess for lighter portable O2

## 2019-05-15 NOTE — Assessment & Plan Note (Signed)
Wet prep negative.  GC chlamydia negative.  RPR, HIV, hep C all pending.

## 2019-05-18 ENCOUNTER — Ambulatory Visit (INDEPENDENT_AMBULATORY_CARE_PROVIDER_SITE_OTHER): Payer: Medicare HMO | Admitting: *Deleted

## 2019-05-18 DIAGNOSIS — I4581 Long QT syndrome: Secondary | ICD-10-CM

## 2019-05-18 DIAGNOSIS — Q246 Congenital heart block: Secondary | ICD-10-CM

## 2019-05-18 LAB — CUP PACEART REMOTE DEVICE CHECK
Battery Remaining Longevity: 7 mo
Battery Voltage: 2.85 V
Brady Statistic AP VP Percent: 0.22 %
Brady Statistic AP VS Percent: 0 %
Brady Statistic AS VP Percent: 95.28 %
Brady Statistic AS VS Percent: 4.5 %
Brady Statistic RA Percent Paced: 0.22 %
Brady Statistic RV Percent Paced: 94.87 %
Date Time Interrogation Session: 20200729154352
Implantable Lead Implant Date: 19971017
Implantable Lead Implant Date: 19971017
Implantable Lead Implant Date: 20130520
Implantable Lead Location: 753858
Implantable Lead Location: 753859
Implantable Lead Location: 753860
Implantable Lead Model: 4196
Implantable Pulse Generator Implant Date: 20130520
Lead Channel Impedance Value: 228 Ohm
Lead Channel Impedance Value: 247 Ohm
Lead Channel Impedance Value: 266 Ohm
Lead Channel Impedance Value: 285 Ohm
Lead Channel Impedance Value: 399 Ohm
Lead Channel Impedance Value: 437 Ohm
Lead Channel Impedance Value: 437 Ohm
Lead Channel Impedance Value: 456 Ohm
Lead Channel Impedance Value: 684 Ohm
Lead Channel Pacing Threshold Amplitude: 0.5 V
Lead Channel Pacing Threshold Amplitude: 0.5 V
Lead Channel Pacing Threshold Amplitude: 0.75 V
Lead Channel Pacing Threshold Pulse Width: 0.4 ms
Lead Channel Pacing Threshold Pulse Width: 0.4 ms
Lead Channel Pacing Threshold Pulse Width: 0.4 ms
Lead Channel Sensing Intrinsic Amplitude: 13 mV
Lead Channel Sensing Intrinsic Amplitude: 13 mV
Lead Channel Sensing Intrinsic Amplitude: 4.25 mV
Lead Channel Sensing Intrinsic Amplitude: 4.25 mV
Lead Channel Setting Pacing Amplitude: 1.75 V
Lead Channel Setting Pacing Amplitude: 2 V
Lead Channel Setting Pacing Amplitude: 2 V
Lead Channel Setting Pacing Pulse Width: 0.4 ms
Lead Channel Setting Pacing Pulse Width: 0.4 ms
Lead Channel Setting Sensing Sensitivity: 0.9 mV

## 2019-05-26 ENCOUNTER — Telehealth: Payer: Self-pay | Admitting: Internal Medicine

## 2019-05-26 NOTE — Telephone Encounter (Signed)
New Message    Pt is calling about results from her device   Please call back

## 2019-05-27 NOTE — Progress Notes (Signed)
Remote pacemaker transmission.   

## 2019-05-27 NOTE — Telephone Encounter (Signed)
Unable to leave VM, mailbox is full.

## 2019-05-31 NOTE — Telephone Encounter (Signed)
Attempted to call patient, unable to leave message.

## 2019-06-07 ENCOUNTER — Other Ambulatory Visit: Payer: Self-pay

## 2019-06-07 ENCOUNTER — Encounter: Payer: Self-pay | Admitting: Adult Health

## 2019-06-07 ENCOUNTER — Ambulatory Visit (INDEPENDENT_AMBULATORY_CARE_PROVIDER_SITE_OTHER): Payer: Medicare HMO | Admitting: Adult Health

## 2019-06-07 DIAGNOSIS — D8989 Other specified disorders involving the immune mechanism, not elsewhere classified: Secondary | ICD-10-CM | POA: Diagnosis not present

## 2019-06-07 DIAGNOSIS — J849 Interstitial pulmonary disease, unspecified: Secondary | ICD-10-CM

## 2019-06-07 DIAGNOSIS — J209 Acute bronchitis, unspecified: Secondary | ICD-10-CM

## 2019-06-07 DIAGNOSIS — J9611 Chronic respiratory failure with hypoxia: Secondary | ICD-10-CM | POA: Diagnosis not present

## 2019-06-07 MED ORDER — AMOXICILLIN-POT CLAVULANATE 875-125 MG PO TABS: 1.0000 | ORAL_TABLET | Freq: Two times a day (BID) | ORAL | 0 refills | Status: AC

## 2019-06-07 MED ORDER — BREO ELLIPTA 100-25 MCG/INH IN AEPB: 1.0000 | INHALATION_SPRAY | Freq: Every day | RESPIRATORY_TRACT | 5 refills | Status: AC

## 2019-06-07 MED ORDER — BREO ELLIPTA 100-25 MCG/INH IN AEPB: 1.0000 | INHALATION_SPRAY | Freq: Every day | RESPIRATORY_TRACT | 0 refills | Status: DC

## 2019-06-07 NOTE — Assessment & Plan Note (Signed)
Patient has underlying anti-synthetase syndrome.  She has ILD on CT chest.  And is oxygen dependent.  Most recent CT chest April 2020 showed stable ILD changes.  Last PFTs were in 2018.  Will need PFTs on return.  She will need pre-screening COVID testing,  Plan  Patient Instructions  Restart BREO 1 puff daily , rinse after use .  Augmentin 875mg  Twice daily  For 1 week, take with food Mucinex DM Twice daily  As needed  Cough/congestion .  Sputum culture today .  Fluids and rest  Continue on Oxygen 2l/m rest and 4l/m with activity .  Follow up with Dr. Annamaria Boots  In 6 weeks and As needed   Please contact office for sooner follow up if symptoms do not improve or worsen or seek emergency care

## 2019-06-07 NOTE — Progress Notes (Signed)
_0  ID: Makayla Vasquez, female    DOB: October 18, 1981, 38 y.o.   MRN: [id number]  Chief Complaint  Patient presents with  . Acute Visit    Cough     Referring provider: Nuala Alpha, DO  HPI: 38 year old female former smoker followed for asthmatic bronchitis, allergic rhinitis, ILD Patient has a complicated medical history with anti-synthetase syndrome (diagnosed 2014) followed by rheumatology at [facility] (anti-Jo-1 antibody, ILD, myositis and elevated CK-very responsive to steroids) Medical history includes diabetes, bipolar disorder, cardiomyopathy, AV block status post pacemaker, pulmonary hypertension  TEST/EVENTS :  CTA chest 11/2017 : Stable chronic reticulonodular interstitial lung disease.  CT a chest April 2020 showed chronic interstitial changes with nodularity similar to previous exam.  Stable mediastinal adenopathy negative PE  ECHO 10/2017 : Pulmonary arteries: PA peak pressure: 39 mm Hg (S). (unchanged from prior echo) Echo February 2020 showed EF 35 to 40%, left ventricle hypokinesis, pulmonary artery pressure 31 mmHg.,  Grade 1 diastolic dysfunction  PFT (12/23/2016): FVC 56%, FEV1/FVC= 108%, TLC 49% and DLCO 50% - consistent with restrictive lung disease   06/07/2019 Acute OV : Cough  Patient presents for an acute office visit.  Patient complains of 2 weeks of cough congestion Thick yellow to tan mucus sinus drainage.  She was tried on Brio last visit.  Says that she believes this helped her breathing however only had a sample.  She denies any fever, chest pain, orthopnea or increased leg swelling.  She is prone to recurrent upper respiratory infections.  Says she has not been on any recent antibiotics.  She has not taken any over-the-counter medications. Appetite is good  Patient has a very complicated medical history including an anti-synthetase syndrome on CellCept, Prograf and prednisone 7.5 mg she is followed closely at [facility].  She has known interstitial lung disease.  She is on oxygen 2 L at rest 4 L with activity.   Allergies  Allergen Reactions  . 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 Sertraline Hcl Hives  . Tape Other (See Comments)    "Burns" the skin    Immunization History  Administered Date(s) Administered  . Influenza Split 12/25/2011, 09/27/2012  . Influenza,inj,Quad PF,6+ Mos 10/28/2013, 08/07/2014, 09/29/2016, 07/16/2018  . Influenza-Unspecified 08/20/2015  . PPD Test 05/18/2012, 09/10/2012, 03/27/2014, 02/12/2015, 03/03/2017  . Pneumococcal Polysaccharide-23 02/16/2015  . Td 03/20/2006    Past Medical History:  Diagnosis Date  . Anxiety   . Arthritis    rheumatoid arthritis- mild, no rheumatology care   . Asthma    seasonal allergies   . Asymptomatic LV dysfunction    a. Echo in Dec 2011 with EF 35 to 40%. Felt to be due to paced rhythm. b. EF 25-30% in 07/2014.  . Bipolar affective disorder ([place])   . Cardiac pacemaker    a. Since age 52 in 38. b. Upgrade to BiV in 2013.  [name] Carpal tunnel syndrome of right wrist   . CHF (congestive heart failure) ([place])   . Congenital complete AV block   . Cor pulmonale, chronic ([place]) 04/08/2018  . Depression    bipolar  . Diabetes mellitus without complication ([place])   . GERD (gastroesophageal reflux disease)   . Hypertension   . Interstitial lung disease ([place])   . Lupus ([place])   . Lupus (systemic lupus erythematosus) ([place])   . Obesity   . Pneumonitis  a. a/w hypoxia - inflammatory - large workup 07/2014.  . Presence of permanent cardiac pacemaker   . Seizures ([place])    as a child- from high fever  . Sinus tachycardia     Tobacco History: Social History   Tobacco Use  Smoking Status Former Smoker  . Packs/day: 0.25  . Years: 0.50  . Pack years: 0.12  . Types: Cigarettes  . Quit date: 07/25/1996  . Years since quitting: 22.8  Smokeless Tobacco Never  Used   Counseling given: Not Answered   Outpatient Medications Prior to Visit  Medication Sig Dispense Refill  . albuterol (VENTOLIN HFA) 108 (90 Base) MCG/ACT inhaler Inhale 2 puffs into the lungs every 4 (four) hours as needed for wheezing or shortness of breath. 6.7 Inhaler 0  . Alcohol Swabs (B-D SINGLE USE SWABS REGULAR) PADS 1 application by Does not apply route 4 (four) times daily. 120 each 2  . amoxicillin (AMOXIL) 250 MG capsule Take 250 mg by mouth 3 (three) times daily. Pt reports taking it 03/31/2019 after dental work    . Azelastine-Fluticasone (DYMISTA) 137-50 MCG/ACT SUSP Place 2 sprays into both nostrils 1 day or 1 dose. 1 Bottle 5  . benzonatate (TESSALON) 100 MG capsule Take 1 capsule (100 mg total) by mouth every 8 (eight) hours. 21 capsule 0  . bisoprolol (ZEBETA) 5 MG tablet Take 1 tablet (5 mg total) by mouth daily. (Patient taking differently: Take 5 mg by mouth at bedtime. ) 90 tablet 1  . Blood Glucose Calibration (ACCU-CHEK AVIVA) SOLN 1 application by In Vitro route 4 (four) times daily. 1 each 0  . blood glucose meter kit and supplies Dispense based on patient and insurance preference. Use up to four times daily as directed. (FOR ICD-10 E10.9, E11.9). 1 each 0  . dextromethorphan-guaiFENesin (ROBITUSSIN-DM) 10-100 MG/5ML liquid TAKE 5 MLS BY MOUTH EVERY 4 (FOUR) HOURS AS NEEDED FOR COUGH. (Patient taking differently: Take 5 mLs by mouth every 4 (four) hours as needed for cough. ) 118 mL 0  . Etonogestrel (NEXPLANON [place]) Inject 1 Device into the skin once. Every 3 years    . fluticasone (FLONASE) 50 MCG/ACT nasal spray Place 2 sprays into both nostrils daily. 16 g 6  . furosemide (LASIX) 40 MG tablet TAKE 1 TABLET BY MOUTH ONCE DAILY 30 tablet 5  . ipratropium (ATROVENT) 0.06 % nasal spray 2 sprays in each nostril 3-4 times daily as needed. 15 mL 5  . LANTUS SOLOSTAR 100 UNIT/ML Solostar Pen INJECT 15 UNITS SUBCUTANEOUSLY EVERY DAY (Patient taking differently: Inject 15  Units into the skin daily. ) 15 pen 1  . metFORMIN (GLUCOPHAGE) 1000 MG tablet TAKE 1 TABLET BY MOUTH TWICE DAILY WITH A MEAL 90 tablet 2  . mycophenolate (CELLCEPT) 500 MG tablet Take 1,000 mg by mouth 2 (two) times daily.     [name] NOVOLOG FLEXPEN 100 UNIT/ML FlexPen INJECT 8 UNITS INTO THE SKIN 3 TIMES DAILY WITH MEALS. (Patient taking differently: Inject 8 Units into the skin 3 (three) times daily with meals. ) 15 pen 4  . omeprazole (PRILOSEC) 20 MG capsule TAKE 1 CAPSULE BY MOUTH EVERY DAY 90 capsule 1  . OXYGEN Inhale 2-4 L into the lungs continuous.     . potassium chloride SA (K-DUR,KLOR-CON) 20 MEQ tablet Take 1 tablet (20 mEq total) by mouth daily. 90 tablet 3  . predniSONE (DELTASONE) 10 MG tablet Take 10 mg by mouth daily with breakfast.    . tacrolimus (PROGRAF) 0.5 MG  capsule Take 1.5 mg by mouth 2 (two) times daily.    . traZODone (DESYREL) 50 MG tablet TAKE 1 TABLET (50 MG TOTAL) BY MOUTH AT BEDTIME AS NEEDED FOR SLEEP. 90 tablet 2  . fluticasone furoate-vilanterol (BREO ELLIPTA) 100-25 MCG/INH AEPB Inhale 1 puff into the lungs daily. 1 each 0   No facility-administered medications prior to visit.      Review of Systems:   Constitutional:   No  weight loss, night sweats,  Fevers, chills, fatigue, or  lassitude.  HEENT:   No headaches,  Difficulty swallowing,  Tooth/dental problems, or  Sore throat,                No sneezing, itching, ear ache, nasal congestion, post nasal drip,   CV:  No chest pain,  Orthopnea, PND, swelling in lower extremities, anasarca, dizziness, palpitations, syncope.   GI  No heartburn, indigestion, abdominal pain, nausea, vomiting, diarrhea, change in bowel habits, loss of appetite, bloody stools.   Resp: No shortness of breath with exertion or at rest.  No excess mucus, no productive cough,  No non-productive cough,  No coughing up of blood.  No change in color of mucus.  No wheezing.  No chest wall deformity  Skin: no rash or lesions.  GU: no  dysuria, change in color of urine, no urgency or frequency.  No flank pain, no hematuria   MS:  No joint pain or swelling.  No decreased range of motion.  No back pain.    Physical Exam  BP 110/68 (BP Location: Left Arm, Patient Position: Sitting, Cuff Size: Normal)   Pulse 81   Temp 98.3 F (36.8 C) (Oral)   Ht _0  (1.651 m)   Wt 143 lb (64.9 kg)   SpO2 95%   BMI 23.80 kg/m   GEN: A/Ox3; pleasant , NAD, chronically ill-appearing on oxygen   HEENT:  [place]/AT,  , NOSE-clear, THROAT-clear, no lesions, no postnasal drip or exudate noted.   NECK:  Supple w/ fair ROM; no JVD; normal carotid impulses w/o bruits; no thyromegaly or nodules palpated; no lymphadenopathy.    RESP few scattered rhonchi   no accessory muscle use, no dullness to percussion  CARD:  RRR, no m/r/g, no peripheral edema, pulses intact, no cyanosis or clubbing.  GI:   Soft & nt; nml bowel sounds; no organomegaly or masses detected.   Musco: Warm bil, no deformities or joint swelling noted.   Neuro: alert, no focal deficits noted.    Skin: Warm, no lesions or rashes    Lab Results:  CBC    Component Value Date/Time   WBC 7.3 02/24/2019 0155   RBC 3.90 02/24/2019 0155   HGB 9.2 (L) 02/24/2019 0155   HGB 11.8 07/15/2017 1522   HCT 32.5 (L) 02/24/2019 0155   HCT 35.4 07/15/2017 1522   PLT 265 02/24/2019 0155   PLT 369 07/15/2017 1522   MCV 83.3 02/24/2019 0155   MCV 77 (L) 07/15/2017 1522   MCH 23.6 (L) 02/24/2019 0155   MCHC 28.3 (L) 02/24/2019 0155   RDW 16.6 (H) 02/24/2019 0155   RDW 16.1 (H) 07/15/2017 1522   LYMPHSABS 1.2 02/24/2019 0155   LYMPHSABS 1.3 07/15/2017 1522   MONOABS 0.3 02/24/2019 0155   EOSABS 0.1 02/24/2019 0155   EOSABS 0.1 07/15/2017 1522   BASOSABS 0.0 02/24/2019 0155   BASOSABS 0.0 07/15/2017 1522    BMET    Component Value Date/Time   NA 138 02/24/2019 0155  NA 138 07/15/2017 1522   K 4.3 02/24/2019 0155   CL 104 02/24/2019 0155   CO2 21 (L) 02/24/2019 0155    GLUCOSE 169 (H) 02/24/2019 0155   BUN 9 02/24/2019 0155   BUN 9 07/15/2017 1522   CREATININE 0.82 02/24/2019 0155   CREATININE 0.42 (L) 11/11/2016 1056   CALCIUM 8.5 (L) 02/24/2019 0155   GFRNONAA >60 02/24/2019 0155   GFRNONAA >89 11/11/2016 1056   GFRAA >60 02/24/2019 0155   GFRAA >89 11/11/2016 1056    BNP    Component Value Date/Time   BNP 132.5 (H) 02/24/2019 0217    ProBNP    Component Value Date/Time   PROBNP 11.0 07/06/2014 1431    Imaging: No results found.    PFT Results Latest Ref Rng & Units 12/23/2016 09/26/2014  FVC-Pre L 1.82 1.61  FVC-Predicted Pre % 56 45  FVC-Post L 1.60 1.65  FVC-Predicted Post % 49 47  Pre FEV1/FVC % % 91 94  Post FEV1/FCV % % 99 94  FEV1-Pre L 1.66 1.52  FEV1-Predicted Pre % 61 51  FEV1-Post L 1.58 1.56  DLCO UNC% % 21 32  DLCO COR %Predicted % 50 70  TLC L 2.57 3.52  TLC % Predicted % 49 64  RV % Predicted % 54 117    No results found for: NITRICOXIDE      Assessment & Plan:   Acute bronchitis Acute asthmatic bronchitis exacerbation. We will restart Breo once daily.  Patient is immunosuppressed.  Will check sputum culture.  Plan  Patient Instructions  Restart BREO 1 puff daily , rinse after use .  Augmentin 853m Twice daily  For 1 week, take with food Mucinex DM Twice daily  As needed  Cough/congestion .  Sputum culture today .  Fluids and rest  Continue on Oxygen 2l/m rest and 4l/m with activity .  Follow up with Dr. YAnnamaria Boots In 6 weeks and As needed   Please contact office for sooner follow up if symptoms do not improve or worsen or seek emergency care         Interstitial lung disease ([facility] Patient has underlying anti-synthetase syndrome.  She has ILD on CT chest.  And is oxygen dependent.  Most recent CT chest April 2020 showed stable ILD changes.  Last PFTs were in 2018.  Will need PFTs on return.  She will need pre-screening COVID testing,  Plan  Patient Instructions  Restart BREO 1 puff daily ,  rinse after use .  Augmentin 8742mTwice daily  For 1 week, take with food Mucinex DM Twice daily  As needed  Cough/congestion .  Sputum culture today .  Fluids and rest  Continue on Oxygen 2l/m rest and 4l/m with activity .  Follow up with Dr. YoAnnamaria BootsIn 6 weeks and As needed   Please contact office for sooner follow up if symptoms do not improve or worsen or seek emergency care         Chronic respiratory failure with [place] are at [place].  Continue on oxygen 2 L at rest and 4 L with activity.  Plan  Patient Instructions  Restart BREO 1 puff daily , rinse after use .  Augmentin 87555mwice daily  For 1 week, take with food Mucinex DM Twice daily  As needed  Cough/congestion .  Sputum culture today .  Fluids and rest  Continue on Oxygen 2l/m rest and 4l/m with activity .  Follow up with Dr. YouAnnamaria Boots  In 6 weeks and As needed   Please contact office for sooner follow up if symptoms do not improve or worsen or seek emergency care         Antisynthetase syndrome [facility]) Continue follow-up with [facility] and current regimen     Rexene Edison, NP 06/07/2019

## 2019-06-07 NOTE — Assessment & Plan Note (Signed)
Oxygen demands are at baseline.  Continue on oxygen 2 L at rest and 4 L with activity.  Plan  Patient Instructions  Restart BREO 1 puff daily , rinse after use .  Augmentin 875mg  Twice daily  For 1 week, take with food Mucinex DM Twice daily  As needed  Cough/congestion .  Sputum culture today .  Fluids and rest  Continue on Oxygen 2l/m rest and 4l/m with activity .  Follow up with Dr. Annamaria Boots  In 6 weeks and As needed   Please contact office for sooner follow up if symptoms do not improve or worsen or seek emergency care

## 2019-06-07 NOTE — Assessment & Plan Note (Signed)
Acute asthmatic bronchitis exacerbation. We will restart Breo once daily.  Patient is immunosuppressed.  Will check sputum culture.  Plan  Patient Instructions  Restart BREO 1 puff daily , rinse after use .  Augmentin 875mg  Twice daily  For 1 week, take with food Mucinex DM Twice daily  As needed  Cough/congestion .  Sputum culture today .  Fluids and rest  Continue on Oxygen 2l/m rest and 4l/m with activity .  Follow up with Dr. Annamaria Boots  In 6 weeks and As needed   Please contact office for sooner follow up if symptoms do not improve or worsen or seek emergency care

## 2019-06-07 NOTE — Assessment & Plan Note (Signed)
Continue follow-up with [facility] and current regimen

## 2019-06-07 NOTE — Patient Instructions (Signed)
Restart BREO 1 puff daily , rinse after use .  Augmentin 875mg  Twice daily  For 1 week, take with food Mucinex DM Twice daily  As needed  Cough/congestion .  Sputum culture today .  Fluids and rest  Continue on Oxygen 2l/m rest and 4l/m with activity .  Follow up with Dr. Annamaria Boots  In 6 weeks and As needed   Please contact office for sooner follow up if symptoms do not improve or worsen or seek emergency care

## 2019-06-08 ENCOUNTER — Ambulatory Visit: Payer: Medicare HMO | Admitting: Family Medicine

## 2019-06-09 ENCOUNTER — Encounter: Payer: Self-pay | Admitting: Family Medicine

## 2019-06-09 ENCOUNTER — Other Ambulatory Visit: Payer: Self-pay

## 2019-06-09 ENCOUNTER — Ambulatory Visit (INDEPENDENT_AMBULATORY_CARE_PROVIDER_SITE_OTHER): Payer: Medicare HMO | Admitting: Family Medicine

## 2019-06-09 VITALS — BP 100/60 | HR 122 | Ht 65.0 in | Wt 146.1 lb

## 2019-06-09 DIAGNOSIS — R05 Cough: Secondary | ICD-10-CM | POA: Diagnosis not present

## 2019-06-09 DIAGNOSIS — E099 Drug or chemical induced diabetes mellitus without complications: Secondary | ICD-10-CM

## 2019-06-09 DIAGNOSIS — T380X5A Adverse effect of glucocorticoids and synthetic analogues, initial encounter: Secondary | ICD-10-CM

## 2019-06-09 DIAGNOSIS — R059 Cough, unspecified: Secondary | ICD-10-CM

## 2019-06-09 LAB — POCT GLYCOSYLATED HEMOGLOBIN (HGB A1C): HbA1c, POC (controlled diabetic range): 7.1 % — AB (ref 0.0–7.0)

## 2019-06-09 NOTE — Patient Instructions (Signed)
Acute Bronchitis, Adult Acute bronchitis is when air tubes (bronchi) in the lungs suddenly get swollen. The condition can make it hard to breathe. It can also cause these symptoms:  A cough.  Coughing up clear, yellow, or green mucus.  Wheezing.  Chest congestion.  Shortness of breath.  A fever.  Body aches.  Chills.  A sore throat. Follow these instructions at home:  Medicines  Take over-the-counter and prescription medicines only as told by your doctor.  If you were prescribed an antibiotic medicine, take it as told by your doctor. Do not stop taking the antibiotic even if you start to feel better. General instructions  Rest.  Drink enough fluids to keep your pee (urine) pale yellow.  Avoid smoking and secondhand smoke. If you smoke and you need help quitting, ask your doctor. Quitting will help your lungs heal faster.  Use an inhaler, cool mist vaporizer, or humidifier as told by your doctor.  Keep all follow-up visits as told by your doctor. This is important. How is this prevented? To lower your risk of getting this condition again:  Wash your hands often with soap and water. If you cannot use soap and water, use hand sanitizer.  Avoid contact with people who have cold symptoms.  Try not to touch your hands to your mouth, nose, or eyes.  Make sure to get the flu shot every year. Contact a doctor if:  Your symptoms do not get better in 2 weeks. Get help right away if:  You cough up blood.  You have chest pain.  You have very bad shortness of breath.  You become dehydrated.  You faint (pass out) or keep feeling like you are going to pass out.  You keep throwing up (vomiting).  You have a very bad headache.  Your fever or chills gets worse. This information is not intended to replace advice given to you by your health care provider. Make sure you discuss any questions you have with your health care provider. Document Released: 03/24/2008 Document  Revised: 09/18/2017 Document Reviewed: 03/26/2016 Elsevier Patient Education  2020 Elsevier Inc.  

## 2019-06-09 NOTE — Progress Notes (Deleted)
Date of Visit: 06/09/2019   CC:  Drainage  HPI:  Makayla Vasquez is a 38 year old female with past history of T2DM, CHF, adrenal insuffiencey and lupus presenting with a 1.5 week history of drainage. Deni Other sx?  Fever, cough, facial fullness, eye pain, ear pain, trigger Past hx? Med review? [place]? Help? Aggressor?  HM: Foot exam? A1C  ROS: See HPI.  [place]: ***  PHYSICAL EXAM: There were no vitals taken for this visit. Gen: *** HEENT: *** Heart: *** Lungs: *** Abd: Neuro: *** Ext: ***  ASSESSMENT/PLAN:  Health maintenance:  -***  No problem-specific Assessment & Plan notes found for this encounter.  FOLLOW UP: Follow up in *** for ***  Vernice Jefferson MS3, Medical Student

## 2019-06-11 NOTE — Progress Notes (Signed)
     Subjective: Chief Complaint  Patient presents with  . Follow-up     HPI: Makayla Vasquez is a 38 y.o. presenting to clinic today to discuss the following:  Cough Patient endorses 1 1/2 weeks of productive cough with white thick phelgm that is worse at night. She endorses no fever, no SOB, no chest pain, no sinus pressure. She does have chills, congestion, and headaches. She is typically on 2L of O2 while at rest and needs 4L while being active. She has no new oxygen requirement. She was recently seen by her pulmonologist and diagnosed with acute bronchitis. She also has a history of interstitial lung disease.  ROS noted in HPI.   Past Medical, Surgical, Social, and Family History Reviewed & Updated per EMR.   Pertinent Historical Findings include:   Social History   Tobacco Use  Smoking Status Former Smoker  . Packs/day: 0.25  . Years: 0.50  . Pack years: 0.12  . Types: Cigarettes  . Quit date: 07/25/1996  . Years since quitting: 22.8  Smokeless Tobacco Never Used   Objective: BP 100/60   Pulse (!) 122   Ht 5\' 5"  (1.651 m)   Wt 146 lb 2 oz (66.3 kg)   SpO2 93%   BMI 24.32 kg/m  Vitals and nursing notes reviewed  Physical Exam Gen: Alert and Oriented x 3, NAD HEENT: Normocephalic, atraumatic Neck: no LAD CV: RRR, no murmurs, normal S1, S2 split Resp: decreased air movement symmetrical bilaterally, CTAB, no wheezing, rales, or rhonchi, comfortable work of breathing, on 2L of [place] Ext: no clubbing, cyanosis, or edema Skin: warm, dry, intact, no rashes  Results for orders placed or performed in visit on 06/09/19 (from the past 72 hour(s))  HgB A1c     Status: Abnormal   Collection Time: 06/09/19  2:05 PM  Result Value Ref Range   Hemoglobin A1C     HbA1c POC (<> result, manual entry)     HbA1c, POC (prediabetic range)     HbA1c, POC (controlled diabetic range) 7.1 (A) 0.0 - 7.0 %   *Note: Due to a large number of results and/or encounters for the requested  time period, some results have not been displayed. A complete set of results can be found in Results Review.    Assessment/Plan:  Cough I suspect most likely a viral bronchitis however given her history and chronic lung condition agree with pulmonology assessment and plan. - Cont Abx started by pulm - Agree with starting Breo - F/u if symptoms worsen   PATIENT EDUCATION PROVIDED: See AVS    Diagnosis and plan along with any newly prescribed medication(s) were discussed in detail with this patient today. The patient verbalized understanding and agreed with the plan. Patient advised if symptoms worsen return to clinic or ER.    Orders Placed This Encounter  Procedures  . HgB A1c    No orders of the defined types were placed in this encounter.   Harolyn Rutherford, DO 06/11/2019, 11:59 PM PGY-3 [place]

## 2019-06-12 NOTE — Assessment & Plan Note (Signed)
I suspect most likely a viral bronchitis however given her history and chronic lung condition agree with pulmonology assessment and plan. - Cont Abx started by pulm - Agree with starting Breo - F/u if symptoms worsen

## 2019-06-20 ENCOUNTER — Ambulatory Visit (INDEPENDENT_AMBULATORY_CARE_PROVIDER_SITE_OTHER): Payer: Medicare HMO | Admitting: *Deleted

## 2019-06-20 DIAGNOSIS — Z950 Presence of cardiac pacemaker: Secondary | ICD-10-CM

## 2019-06-20 LAB — CUP PACEART REMOTE DEVICE CHECK
Battery Remaining Longevity: 6 mo
Battery Voltage: 2.84 V
Brady Statistic AP VP Percent: 1.01 %
Brady Statistic AP VS Percent: 0.01 %
Brady Statistic AS VP Percent: 95.29 %
Brady Statistic AS VS Percent: 3.69 %
Brady Statistic RA Percent Paced: 1.02 %
Brady Statistic RV Percent Paced: 94.69 %
Date Time Interrogation Session: 20200831122529
Implantable Lead Implant Date: 19971017
Implantable Lead Implant Date: 19971017
Implantable Lead Implant Date: 20130520
Implantable Lead Location: 753858
Implantable Lead Location: 753859
Implantable Lead Location: 753860
Implantable Lead Model: 4196
Implantable Pulse Generator Implant Date: 20130520
Lead Channel Impedance Value: 228 Ohm
Lead Channel Impedance Value: 228 Ohm
Lead Channel Impedance Value: 266 Ohm
Lead Channel Impedance Value: 266 Ohm
Lead Channel Impedance Value: 380 Ohm
Lead Channel Impedance Value: 399 Ohm
Lead Channel Impedance Value: 399 Ohm
Lead Channel Impedance Value: 437 Ohm
Lead Channel Impedance Value: 627 Ohm
Lead Channel Pacing Threshold Amplitude: 0.5 V
Lead Channel Pacing Threshold Amplitude: 0.75 V
Lead Channel Pacing Threshold Amplitude: 1.25 V
Lead Channel Pacing Threshold Pulse Width: 0.4 ms
Lead Channel Pacing Threshold Pulse Width: 0.4 ms
Lead Channel Pacing Threshold Pulse Width: 0.4 ms
Lead Channel Sensing Intrinsic Amplitude: 13 mV
Lead Channel Sensing Intrinsic Amplitude: 13 mV
Lead Channel Sensing Intrinsic Amplitude: 4 mV
Lead Channel Sensing Intrinsic Amplitude: 4 mV
Lead Channel Setting Pacing Amplitude: 1.75 V
Lead Channel Setting Pacing Amplitude: 2 V
Lead Channel Setting Pacing Amplitude: 2 V
Lead Channel Setting Pacing Pulse Width: 0.4 ms
Lead Channel Setting Pacing Pulse Width: 0.4 ms
Lead Channel Setting Sensing Sensitivity: 0.9 mV

## 2019-06-25 ENCOUNTER — Emergency Department (HOSPITAL_COMMUNITY)
Admission: EM | Admit: 2019-06-25 | Discharge: 2019-06-25 | Disposition: A | Discharge status: Home / Self Care | Payer: Medicare HMO | Attending: Emergency Medicine | Admitting: Emergency Medicine

## 2019-06-25 ENCOUNTER — Emergency Department (HOSPITAL_COMMUNITY): Payer: Medicare HMO

## 2019-06-25 ENCOUNTER — Encounter (HOSPITAL_COMMUNITY): Payer: Self-pay

## 2019-06-25 ENCOUNTER — Other Ambulatory Visit: Payer: Self-pay

## 2019-06-25 DIAGNOSIS — I5022 Chronic systolic (congestive) heart failure: Secondary | ICD-10-CM | POA: Insufficient documentation

## 2019-06-25 DIAGNOSIS — B9789 Other viral agents as the cause of diseases classified elsewhere: Secondary | ICD-10-CM

## 2019-06-25 DIAGNOSIS — J45909 Unspecified asthma, uncomplicated: Secondary | ICD-10-CM | POA: Diagnosis not present

## 2019-06-25 DIAGNOSIS — J988 Other specified respiratory disorders: Secondary | ICD-10-CM

## 2019-06-25 DIAGNOSIS — E119 Type 2 diabetes mellitus without complications: Secondary | ICD-10-CM | POA: Insufficient documentation

## 2019-06-25 DIAGNOSIS — Z20828 Contact with and (suspected) exposure to other viral communicable diseases: Secondary | ICD-10-CM | POA: Diagnosis not present

## 2019-06-25 DIAGNOSIS — R0602 Shortness of breath: Secondary | ICD-10-CM | POA: Diagnosis present

## 2019-06-25 DIAGNOSIS — F17210 Nicotine dependence, cigarettes, uncomplicated: Secondary | ICD-10-CM | POA: Diagnosis not present

## 2019-06-25 DIAGNOSIS — Z79899 Other long term (current) drug therapy: Secondary | ICD-10-CM | POA: Diagnosis not present

## 2019-06-25 DIAGNOSIS — B349 Viral infection, unspecified: Secondary | ICD-10-CM | POA: Diagnosis not present

## 2019-06-25 LAB — COMPREHENSIVE METABOLIC PANEL
ALT: 7 U/L (ref 0–44)
AST: 16 U/L (ref 15–41)
Albumin: 3.2 g/dL — ABNORMAL LOW (ref 3.5–5.0)
Alkaline Phosphatase: 63 U/L (ref 38–126)
Anion gap: 8 (ref 5–15)
BUN: 9 mg/dL (ref 6–20)
CO2: 26 mmol/L (ref 22–32)
Calcium: 8.6 mg/dL — ABNORMAL LOW (ref 8.9–10.3)
Chloride: 104 mmol/L (ref 98–111)
Creatinine, Ser: 0.88 mg/dL (ref 0.44–1.00)
GFR calc Af Amer: >60 mL/min (ref >60)
GFR calc non Af Amer: >60 mL/min (ref >60)
Glucose, Bld: 146 mg/dL — ABNORMAL HIGH (ref 70–99)
Potassium: 3.7 mmol/L (ref 3.5–5.1)
Sodium: 138 mmol/L (ref 135–145)
Total Bilirubin: 0.6 mg/dL (ref 0.3–1.2)
Total Protein: 6.6 g/dL (ref 6.5–8.1)

## 2019-06-25 LAB — CBC WITH DIFFERENTIAL/PLATELET
Abs Immature Granulocytes: 0.03 10*3/uL (ref 0.00–0.07)
Basophils Absolute: 0 10*3/uL (ref 0.0–0.1)
Basophils Relative: 1 %
Eosinophils Absolute: 0.3 10*3/uL (ref 0.0–0.5)
Eosinophils Relative: 4 %
HCT: 29.6 % — ABNORMAL LOW (ref 36.0–46.0)
Hemoglobin: 8.1 g/dL — ABNORMAL LOW (ref 12.0–15.0)
Immature Granulocytes: 0 %
Lymphocytes Relative: 16 %
Lymphs Abs: 1.1 10*3/uL (ref 0.7–4.0)
MCH: 20.1 pg — ABNORMAL LOW (ref 26.0–34.0)
MCHC: 27.4 g/dL — ABNORMAL LOW (ref 30.0–36.0)
MCV: 73.6 fL — ABNORMAL LOW (ref 80.0–100.0)
Monocytes Absolute: 0.6 10*3/uL (ref 0.1–1.0)
Monocytes Relative: 9 %
Neutro Abs: 5 10*3/uL (ref 1.7–7.7)
Neutrophils Relative %: 70 %
Platelets: 401 10*3/uL — ABNORMAL HIGH (ref 150–400)
RBC: 4.02 MIL/uL (ref 3.87–5.11)
RDW: 16.8 % — ABNORMAL HIGH (ref 11.5–15.5)
WBC: 7.1 10*3/uL (ref 4.0–10.5)
nRBC: 0 % (ref 0.0–0.2)

## 2019-06-25 LAB — TROPONIN I (HIGH SENSITIVITY)
Troponin I (High Sensitivity): 17 ng/L (ref <18)
Troponin I (High Sensitivity): 15 ng/L (ref <18)

## 2019-06-25 LAB — SARS CORONAVIRUS 2 BY RT PCR (HOSPITAL ORDER, PERFORMED IN ~~LOC~~ HOSPITAL LAB): SARS Coronavirus 2: NEGATIVE

## 2019-06-25 LAB — D-DIMER, QUANTITATIVE: D-Dimer, Quant: 0.46 ug/mL-FEU (ref 0.00–0.50)

## 2019-06-25 LAB — I-STAT BETA HCG BLOOD, ED (MC, WL, AP ONLY): I-stat hCG, quantitative: <5 m[IU]/mL (ref <5)

## 2019-06-25 LAB — BRAIN NATRIURETIC PEPTIDE: B Natriuretic Peptide: 74.7 pg/mL (ref 0.0–100.0)

## 2019-06-25 MED ORDER — ALBUTEROL SULFATE HFA 108 (90 BASE) MCG/ACT IN AERS
8.0000 | INHALATION_SPRAY | Freq: Once | RESPIRATORY_TRACT | Status: AC
  Administered 2019-06-25: 8 via RESPIRATORY_TRACT
  Filled 2019-06-25: qty 6.7

## 2019-06-25 MED ORDER — SODIUM CHLORIDE 0.9 % IV BOLUS
500.0000 mL | Freq: Once | INTRAVENOUS | Status: AC
  Administered 2019-06-25: 500 mL via INTRAVENOUS

## 2019-06-25 MED ORDER — IPRATROPIUM-ALBUTEROL 0.5-2.5 (3) MG/3ML IN SOLN
3.0000 mL | Freq: Once | RESPIRATORY_TRACT | Status: AC
  Administered 2019-06-25: 3 mL via RESPIRATORY_TRACT
  Filled 2019-06-25: qty 3

## 2019-06-25 NOTE — Discharge Instructions (Signed)
If you develop fever, coughing up blood, worsening shortness of breath, chest pain, or any other new/concerning symptoms then return to the ER for evaluation.  Use the albuterol inhaler 2 puffs every 4 hours as needed for cough and/or shortness of breath.

## 2019-06-25 NOTE — ED Provider Notes (Signed)
[facility] EMERGENCY DEPARTMENT Provider Note   CSN: [id number] Arrival date & time: 06/25/19  1540     History   Chief Complaint No chief complaint on file.   HPI Makayla Vasquez is a 38 y.o. female.     HPI  38 year old female with cough and shortness of breath.  Cough has had yellow sputum and overall this started about 2 weeks ago.  About 3 weeks ago her pulmonologist diagnosed her with bronchitis.  This seems to be a recurrent issue for her today.  She is chronically on 2-4 L and has had to bump it up to the 4 because of the progressively worsening shortness of breath.  Today it seemed too much so she came to the hospital.  No chest pain or leg swelling.  No fevers.  No recent sick contacts.  Does have some posttussive emesis and occasional headache with coughing.  Past Medical History:  Diagnosis Date  . Anxiety   . Arthritis    rheumatoid arthritis- mild, no rheumatology care   . Asthma    seasonal allergies   . Asymptomatic LV dysfunction    a. Echo in Dec 2011 with EF 35 to 40%. Felt to be due to paced rhythm. b. EF 25-30% in 07/2014.  . Bipolar affective disorder ([place])   . Cardiac pacemaker    a. Since age 67 in 47. b. Upgrade to BiV in 2013.  [name] Carpal tunnel syndrome of right wrist   . CHF (congestive heart failure) ([place])   . Congenital complete AV block   . Cor pulmonale, chronic ([place]) 04/08/2018  . Depression    bipolar  . Diabetes mellitus without complication ([place])   . GERD (gastroesophageal reflux disease)   . Hypertension   . Interstitial lung disease ([place])   . Lupus ([place])   . Lupus (systemic lupus erythematosus) ([place])   . Obesity   . Pneumonitis    a. a/w hypoxia - inflammatory - large workup 07/2014.  . Presence of permanent cardiac pacemaker   . Seizures ([place])    as a child- from high fever  . Sinus tachycardia     Patient Active Problem List   Diagnosis Date Noted  . Acute bronchitis 06/07/2019  . Screen for STD (sexually  transmitted disease) 05/15/2019  . Moderate persistent asthma with acute exacerbation 04/19/2019  . Chronic nasal congestion 10/29/2018  . CAP (community acquired pneumonia) 10/24/2018  . Immunosuppression ([place]) 10/24/2018  . 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 Diabetes mellitus due to therapeutic use of corticosteroid ([place]) 10/24/2018  . Right wrist pain 07/24/2018  . Anxiety state 07/21/2018  . Upper airway cough syndrome 06/18/2018  . Cor pulmonale, chronic ([place]) 04/08/2018  . Abnormal uterine bleeding 02/14/2018  . Dyspareunia, female 05/14/2017  . Sepsis without acute organ dysfunction ([place]) 01/05/2017  . 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 On Cellcept therapy   . Lupus ([place])   . Fever   . Long term (current) use of systemic steroids   . Chronic diastolic congestive heart failure ([place])   . Cough   . Interstitial lung disease ([place])   . Anemia   . Bipolar affective disorder in remission ([place])   . Acute on chronic congestive heart failure ([place])   . 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 Adrenal insufficiency ([place])   . 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 Idiopathic hypotension 07/08/2015  . Hyperlipidemia associated with type 2 diabetes  mellitus ([place]) 05/04/2015  . Diabetes mellitus type 2 in obese ([place])   . Chronic respiratory failure with hypoxia ([place]) 08/07/2014  . Acute respiratory failure with hypoxia ([place]) 06/19/2014  . Congestive dilated cardiomyopathy ([place]) 05/31/2014  . Chronic systolic heart failure ([place]) 02/26/2012  . PPM-Medtronic   . Congenital complete AV block   . GERD (gastroesophageal reflux disease)   . 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 Polycystic ovaries 12/17/2006    Past Surgical History:  Procedure Laterality Date  . ATRIAL TACH ABLATION N/A 08/14/2014   Procedure: ATRIAL TACH ABLATION;  Surgeon: [name], MD;  Location: [facility] CATH LAB;  Service: Cardiovascular;  Laterality:  N/A;  . BI-VENTRICULAR PACEMAKER UPGRADE N/A 03/08/2012   Procedure: BI-VENTRICULAR PACEMAKER UPGRADE;  Surgeon: [name], MD;  Location: [facility] C[facility] LAB;  Service: Cardiovascular;  Laterality: N/A;  . CARPAL TUNNEL WITH CUBITAL TUNNEL Right 07/26/2013   Procedure: RIGHT LIMITED OPEN CARPAL TUNNEL RELEASE ,  RIGHT CUBITAL TUNNEL RELEASE, INSITU VERSES ULNAR NERVE DECOMPRESSION AND ANTERIOR TRANSPOSITION;  Surgeon: [name], MD;  Location: [facility];  Service: Orthopedics;  Laterality: Right;  . CESAREAN SECTION    . CHOLECYSTECTOMY    . CYST EXCISION  12/10/2012   THYROID  . INSERT / REPLACE / REMOVE PACEMAKER     2001  . IUD REMOVAL  11/03/2011   Procedure: INTRAUTERINE DEVICE (IUD) REMOVAL;  Surgeon: [name], MD;  Location: [facility];  Service: Gynecology;  Laterality: N/A;  . NASAL FRACTURE SURGERY     /w plate   . RIGHT HEART CATHETERIZATION N/A 10/26/2014   Procedure: RIGHT HEART CATH;  Surgeon: [name], MD;  Location: [facility] CATH LAB;  Service: Cardiovascular;  Laterality: N/A;  . THROAT SURGERY  1994   s/p laser treatment  . THYROGLOSSAL DUCT CYST N/A 12/10/2012   Procedure: REVISION OF THYROGLOSSAL DUCT CYST EXCISION;  Surgeon: [name], MD;  Location: [facility];  Service: ENT;  Laterality: N/A;  Revision of Thyroglossal Duct Cyst Excision  . VIDEO BRONCHOSCOPY Bilateral 06/19/2014   Procedure: VIDEO BRONCHOSCOPY WITHOUT FLUORO;  Surgeon: Brand Males, MD;  Location: [facility];  Service: Cardiopulmonary;  Laterality: Bilateral;     OB History    Gravida  1   Para  1   Term  1   Preterm      AB      Living  1     SAB      TAB      Ectopic      Multiple      Live Births               Home Medications    Prior to Admission medications   Medication Sig Start Date End Date Taking? Authorizing Provider  albuterol (VENTOLIN HFA) 108 (90 Base) MCG/ACT inhaler Inhale 2 puffs into the lungs every 4 (four) hours as needed for wheezing or shortness of  breath. 02/25/19   Caroline More, DO  Alcohol Swabs (B-D SINGLE USE SWABS REGULAR) PADS 1 application by Does not apply route 4 (four) times daily. 03/15/19   Belle Bing, DO  amoxicillin (AMOXIL) 250 MG capsule Take 250 mg by mouth 3 (three) times daily. Pt reports taking it 03/31/2019 after dental work    [provider]  Azelastine-Fluticasone (DYMISTA) 137-50 MCG/ACT SUSP Place 2 sprays into both nostrils 1 day or 1 dose. 12/01/18   Kennith Gain, MD  benzonatate (TESSALON) 100 MG capsule Take 1 capsule (  100 mg total) by mouth every 8 (eight) hours. 02/24/19   Volanda Napoleon, PA-C  bisoprolol (ZEBETA) 5 MG tablet Take 1 tablet (5 mg total) by mouth daily. Patient taking differently: Take 5 mg by mouth at bedtime.  12/06/18   [name], MD  Blood Glucose Calibration (ACCU-CHEK AVIVA) SOLN 1 application by In Vitro route 4 (four) times daily. 03/18/19   [place] Bing, DO  blood glucose meter kit and supplies Dispense based on patient and insurance preference. Use up to four times daily as directed. (FOR ICD-10 E10.9, E11.9). 03/18/19   [place] Bing, DO  dextromethorphan-guaiFENesin (ROBITUSSIN-DM) 10-100 MG/5ML liquid TAKE 5 MLS BY MOUTH EVERY 4 (FOUR) HOURS AS NEEDED FOR COUGH. Patient taking differently: Take 5 mLs by mouth every 4 (four) hours as needed for cough.  10/27/18   Rolette Bing, DO  Etonogestrel (NEXPLANON [place]) Inject 1 Device into the skin once. Every 3 years    [provider]  fluticasone (FLONASE) 50 MCG/ACT nasal spray Place 2 sprays into both nostrils daily. 04/19/19   Shirley, [place], DO  fluticasone furoate-vilanterol (BREO ELLIPTA) 100-25 MCG/INH AEPB Inhale 1 puff into the lungs daily. 06/07/19   Parrett, Tammy S, NP  fluticasone furoate-vilanterol (BREO ELLIPTA) 100-25 MCG/INH AEPB Inhale 1 puff into the lungs daily. 06/07/19   Baird Lyons D, MD  furosemide (LASIX) 40 MG tablet TAKE 1 TABLET BY MOUTH ONCE DAILY 03/23/19    [name], MD  ipratropium (ATROVENT) 0.06 % nasal spray 2 sprays in each nostril 3-4 times daily as needed. 03/02/19   Kennith Gain, MD  LANTUS SOLOSTAR 100 UNIT/ML Solostar Pen INJECT Corn DAY Patient taking differently: Inject 15 Units into the skin daily.  01/05/19   Prien Bing, DO  metFORMIN (GLUCOPHAGE) 1000 MG tablet TAKE 1 TABLET BY MOUTH TWICE DAILY WITH A MEAL 03/22/19   Rudy Bing, DO  mycophenolate (CELLCEPT) 500 MG tablet Take 1,000 mg by mouth 2 (two) times daily.     [provider]  NOVOLOG FLEXPEN 100 UNIT/ML FlexPen INJECT 8 UNITS INTO THE SKIN 3 TIMES DAILY WITH MEALS. Patient taking differently: Inject 8 Units into the skin 3 (three) times daily with meals.  11/02/18   [place] Bing, DO  omeprazole (PRILOSEC) 20 MG capsule TAKE 1 CAPSULE BY MOUTH EVERY DAY 05/02/19   Baird Lyons D, MD  OXYGEN Inhale 2-4 L into the lungs continuous.     [provider]  potassium chloride SA (K-DUR,KLOR-CON) 20 MEQ tablet Take 1 tablet (20 mEq total) by mouth daily. 02/01/19   Bensimhon, Shaune Pascal, MD  predniSONE (DELTASONE) 10 MG tablet Take 10 mg by mouth daily with breakfast.    [provider]  tacrolimus (PROGRAF) 0.5 MG capsule Take 1.5 mg by mouth 2 (two) times daily. 11/27/17   [provider]  traZODone (DESYREL) 50 MG tablet TAKE 1 TABLET (50 MG TOTAL) BY MOUTH AT BEDTIME AS NEEDED FOR SLEEP. 09/27/18    Bing, DO    Family History Family History  Problem Relation Age of Onset  . Heart disease Mother        CHF (no details)  . Hypertension Mother   . Lupus Mother   . Heart disease Father        Murmur  . Heart disease Sister 110        No details.  History of a pacemaker    Social History Social History  Tobacco Use  . Smoking status: Former Smoker    Packs/day: 0.25    Years: 0.50    Pack years: 0.12    Types: Cigarettes    Quit date: 07/25/1996    Years since  quitting: 22.9  . Smokeless tobacco: Never Used  Substance Use Topics  . Alcohol use: No  . Drug use: No     Allergies   Atorvastatin, Sertraline hcl, and Tape   Review of Systems Review of Systems  Constitutional: Negative for fever.  Respiratory: Positive for cough and shortness of breath.   Cardiovascular: Negative for chest pain and leg swelling.  Gastrointestinal: Positive for vomiting (post tussive).  Neurological: Positive for headaches.  All other systems reviewed and are negative.    Physical Exam Updated Vital Signs BP (!) 123/94   Pulse 99   Temp 98.7 F (37.1 C) (Oral)   Resp (!) 0   Ht _0  (1.676 m)   Wt 66.2 kg   SpO2 100%   BMI 23.57 kg/m   Physical Exam Vitals signs and nursing note reviewed.  Constitutional:      General: She is not in acute distress.    Appearance: She is well-developed. She is not diaphoretic.  HENT:     Head: Normocephalic and atraumatic.     Right Ear: External ear normal.     Left Ear: External ear normal.     Nose: Nose normal.  Eyes:     General:        Right eye: No discharge.        Left eye: No discharge.  Cardiovascular:     Rate and Rhythm: Regular rhythm. Tachycardia present.     Heart sounds: Normal heart sounds.  Pulmonary:     Effort: Tachypnea present. No accessory muscle usage.     Breath sounds: Examination of the right-lower field reveals decreased breath sounds. Examination of the left-lower field reveals decreased breath sounds. Decreased breath sounds present. No wheezing or rales.  Abdominal:     Palpations: Abdomen is soft.     Tenderness: There is no abdominal tenderness.  Musculoskeletal:     Right lower leg: No edema.     Left lower leg: No edema.  Skin:    General: Skin is warm and dry.  Neurological:     Mental Status: She is alert.  Psychiatric:        Mood and Affect: Mood is not anxious.      ED Treatments / Results  Labs (all labs ordered are listed, but only abnormal  results are displayed) Labs Reviewed  COMPREHENSIVE METABOLIC PANEL - Abnormal; Notable for the following components:      Result Value   Glucose, Bld 146 (*)    Calcium 8.6 (*)    Albumin 3.2 (*)    All other components within normal limits  CBC WITH DIFFERENTIAL/PLATELET - Abnormal; Notable for the following components:   Hemoglobin 8.1 (*)    HCT 29.6 (*)    MCV 73.6 (*)    MCH 20.1 (*)    MCHC 27.4 (*)    RDW 16.8 (*)    Platelets 401 (*)    All other components within normal limits  SARS CORONAVIRUS 2 (HOSPITAL ORDER, [facility])  D-DIMER, QUANTITATIVE (NOT AT [facility])  BRAIN NATRIURETIC PEPTIDE  I-STAT BETA HCG BLOOD, ED ([facility], [facility], [facility] ONLY)  TROPONIN I (HIGH SENSITIVITY)  TROPONIN I (HIGH SENSITIVITY)    EKG EKG Interpretation  Date/Time:  Saturday June 25 2019 15:49:55 EDT Ventricular Rate:  125 PR Interval:    QRS Duration: 94 QT Interval:  332 QTC Calculation: 479 R Axis:   -140 Text Interpretation:  Sinus tachycardia Anterolateral infarct, age indeterminate PVCs no longer present compared to May 2020 Confirmed by Sherwood Gambler [phone]) on 06/25/2019 3:52:31 PM   Radiology Dg Chest Portable 1 View  Result Date: 06/25/2019 CLINICAL DATA:  Cough, dyspnea EXAM: PORTABLE CHEST 1 VIEW COMPARISON:  02/24/2019 FINDINGS: Left-sided implanted cardiac device remains. Stable cardiomediastinal contours. Chronic interstitial changes throughout both lungs. No superimposed focal airspace opacity identified. No pleural effusion or pneumothorax. IMPRESSION: Chronic interstitial lung changes. No superimposed airspace consolidation identified. Electronically Signed   By: Davina Poke M.D.   On: 06/25/2019 16:52    Procedures Procedures (including critical care time)  Medications Ordered in ED Medications  albuterol (VENTOLIN HFA) 108 (90 Base) MCG/ACT inhaler 8 puff (8 puffs Inhalation Given 06/25/19 1629)  sodium chloride 0.9 % bolus 500 mL (0 mLs  Intravenous Stopped 06/25/19 1832)  ipratropium-albuterol (DUONEB) 0.5-2.5 (3) MG/3ML nebulizer solution 3 mL (3 mLs Nebulization Given 06/25/19 1921)  sodium chloride 0.9 % bolus 500 mL (0 mLs Intravenous Stopped 06/25/19 2044)     Initial Impression / Assessment and Plan / ED Course  I have reviewed the triage vital signs and the nursing notes.  Pertinent labs & imaging results that were available during my care of the patient were reviewed by me and considered in my medical decision making (see chart for details).        No clear etiology for her shortness of breath.  Given the productive cough, likely a viral illness.  Coronavirus testing is negative.  My suspicion for pneumonia is pretty low given negative chest x-ray.  Her labs are reassuring including normal WBC, troponins negative x2, negative d-dimer and negative BNP.  My suspicion for PE is pretty low.  Suspicion for ACS is quite low.  Otherwise, she is feeling subjectively better with a little fluids and albuterol.  I advised her to use albuterol at home.  She is chronically on steroids but I am not so convinced this is an acute asthma/COPD exacerbation that she would need a change in her steroids.  Follow-up with her PCP.  We discussed return precautions.  Makayla Vasquez was evaluated in Emergency Department on 06/25/2019 for the symptoms described in the history of present illness. She was evaluated in the context of the global COVID-19 pandemic, which necessitated consideration that the patient might be at risk for infection with the SARS-CoV-2 virus that causes COVID-19. Institutional protocols and algorithms that pertain to the evaluation of patients at risk for COVID-19 are in a state of rapid change based on information released by regulatory bodies including the CDC and federal and state organizations. These policies and algorithms were followed during the patient's care in the ED.   Final Clinical Impressions(s) / ED Diagnoses    Final diagnoses:  Viral respiratory illness    ED Discharge Orders    None       Sherwood Gambler, MD 06/25/19 2100

## 2019-06-25 NOTE — ED Notes (Signed)
Pt dc'd home w/all belongings, pt a/o x4  

## 2019-06-25 NOTE — ED Triage Notes (Signed)
Pt presents with a productive cough with white/yellow sputum, SOB, headache and vomiting from coughing x2 weeks.

## 2019-06-25 NOTE — ED Notes (Signed)
Patient denies pain and is resting comfortably.  

## 2019-06-25 NOTE — ED Notes (Signed)
No pain

## 2019-06-25 NOTE — ED Notes (Signed)
Sl headache  No other complaints

## 2019-06-29 ENCOUNTER — Encounter: Payer: Self-pay | Admitting: Cardiology

## 2019-06-29 NOTE — Progress Notes (Signed)
Remote pacemaker transmission.   

## 2019-07-07 ENCOUNTER — Ambulatory Visit: Payer: Medicare HMO | Admitting: Allergy

## 2019-07-14 ENCOUNTER — Ambulatory Visit (INDEPENDENT_AMBULATORY_CARE_PROVIDER_SITE_OTHER): Payer: Medicare HMO | Admitting: Allergy

## 2019-07-14 ENCOUNTER — Encounter: Payer: Self-pay | Admitting: Allergy

## 2019-07-14 ENCOUNTER — Other Ambulatory Visit: Payer: Self-pay

## 2019-07-14 VITALS — BP 122/82 | HR 98 | Temp 97.3°F | Resp 18

## 2019-07-14 DIAGNOSIS — J310 Chronic rhinitis: Secondary | ICD-10-CM | POA: Diagnosis not present

## 2019-07-14 MED ORDER — CARBINOXAMINE MALEATE 6 MG PO TABS: 6.0000 mg | ORAL_TABLET | Freq: Two times a day (BID) | ORAL | 1 refills | Status: DC

## 2019-07-14 NOTE — Progress Notes (Signed)
Follow-up Note  RE: Makayla Vasquez MRN: [id number] DOB: [birth year] Date of Office Visit: 07/14/2019   History of present illness: Makayla Vasquez is a 38 y.o. female presenting today for follow-up of non-allergic rhinitis.  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    She was last seen in the office on 03/02/2019 by myself.  She was seen in the ED about 3 weeks ago for URI symptoms.  She has negative Covid testing.  She was treated with albuterol/duoneb, IVF.  CXR was unrevealing.     She states she is still struggling with nasal drainage.  She states none of the nose sprays work and burn her nose.  Long-acting antihistamines also have not worked to help decrease her nasal symptoms.  She does use constant supplemental O2.    Review of systems: Review of Systems  Constitutional: Positive for malaise/fatigue. Negative for chills and fever.  HENT: Negative for congestion, ear discharge, nosebleeds and sore throat.   Eyes: Negative for pain, discharge and redness.  Respiratory: Positive for cough and shortness of breath.   Cardiovascular: Negative for chest pain.  Gastrointestinal: Negative for abdominal pain, constipation, diarrhea, nausea and vomiting.  Musculoskeletal: Negative for joint pain.  Skin: Negative for itching and rash.  Neurological: Negative for headaches.    All other systems negative unless noted above in HPI  Past medical/social/surgical/family history have been reviewed and are unchanged unless specifically indicated below.  No changes  Medication List: Allergies as of 07/14/2019      Reactions   Atorvastatin Other (See Comments)   Possible statin-induced myopathy with elevated CK if on Lipitor 40 mg   Sertraline Hcl Hives   Tape Other (See Comments)   "Burns" the skin      Medication List       Accurate as of July 14, 2019  3:52 PM. If you have any questions, ask your nurse or doctor.        STOP taking these medications   amoxicillin 250 MG capsule Commonly known as: AMOXIL Stopped by: Raivyn Kabler Charmian Muff, MD   Azelastine-Fluticasone [phone] MCG/ACT Susp Commonly known as: Dymista Stopped by: Maliaka Brasington Charmian Muff, MD   benzonatate 100 MG capsule Commonly known as: TESSALON Stopped by: Malerie Eakins Charmian Muff, MD   fluticasone 50 MCG/ACT nasal spray Commonly known as: FLONASE Stopped by: Neviah Braud Charmian Muff, MD   ipratropium 0.06 % nasal spray Commonly known as: ATROVENT Stopped by: Addaline Peplinski Charmian Muff, MD     TAKE these medications   Accu-Chek Aviva Soln 1 application by In Vitro route 4 (four) times daily.   albuterol 108 (90 Base) MCG/ACT inhaler Commonly known as: VENTOLIN HFA Inhale 2 puffs into the lungs every 4 (four) hours as needed for wheezing or shortness of breath.   B-D SINGLE USE SWABS REGULAR Pads 1 application by Does not apply route 4 (four) times daily.   bisoprolol 5 MG tablet Commonly known as: ZEBETA Take 1 tablet (5 mg total) by mouth daily. What changed: when to take this   blood glucose meter kit and supplies Dispense based on patient and insurance preference. Use up to four times daily as directed. (FOR ICD-10 E10.9, E11.9).   Breo Ellipta 100-25 MCG/INH Aepb Generic drug: fluticasone furoate-vilanterol Inhale 1 puff into the lungs daily.   Breo Ellipta 100-25 MCG/INH Aepb Generic drug: fluticasone furoate-vilanterol  Inhale 1 puff into the lungs daily.   dextromethorphan-guaiFENesin 10-100 MG/5ML liquid Commonly known as: ROBITUSSIN-DM TAKE 5 MLS BY MOUTH EVERY 4 (FOUR) HOURS AS NEEDED FOR COUGH. What changed: See the new instructions.   furosemide 40 MG tablet Commonly known as: LASIX TAKE 1 TABLET BY MOUTH ONCE DAILY   Lantus SoloStar 100 UNIT/ML Solostar Pen  Generic drug: Insulin Glargine INJECT 15 UNITS SUBCUTANEOUSLY EVERY DAY What changed: See the new instructions.   metFORMIN 1000 MG tablet Commonly known as: GLUCOPHAGE TAKE 1 TABLET BY MOUTH TWICE DAILY WITH A MEAL   mycophenolate 500 MG tablet Commonly known as: CELLCEPT Take 1,000 mg by mouth 2 (two) times daily.   NEXPLANON [place] Inject 1 Device into the skin once. Every 3 years   NovoLOG FlexPen 100 UNIT/ML FlexPen Generic drug: insulin aspart INJECT 8 UNITS INTO THE SKIN 3 TIMES DAILY WITH MEALS. What changed: See the new instructions.   omeprazole 20 MG capsule Commonly known as: PRILOSEC TAKE 1 CAPSULE BY MOUTH EVERY DAY   OXYGEN Inhale 2-4 L into the lungs continuous.   potassium chloride SA 20 MEQ tablet Commonly known as: K-DUR Take 1 tablet (20 mEq total) by mouth daily.   predniSONE 10 MG tablet Commonly known as: DELTASONE Take 10 mg by mouth daily with breakfast.   tacrolimus 0.5 MG capsule Commonly known as: PROGRAF Take 1.5 mg by mouth 2 (two) times daily.   traZODone 50 MG tablet Commonly known as: DESYREL TAKE 1 TABLET (50 MG TOTAL) BY MOUTH AT BEDTIME AS NEEDED FOR SLEEP.       Known medication allergies: Allergies  Allergen Reactions  . 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 Sertraline Hcl Hives  . Tape Other (See Comments)    "Burns" the skin     Physical examination: Blood pressure 122/82, pulse 98, temperature (!) 97.3 F (36.3 C), temperature source Temporal, resp. rate 18, SpO2 99 %.  General: Alert, interactive, in no acute distress, [place] in place. HEENT: PERRLA, TMs pearly gray, turbinates minimally edematous with clear discharge, post-pharynx non erythematous. Neck: Supple without lymphadenopathy. Lungs: Clear to auscultation without wheezing, rhonchi or rales. {no increased work of breathing. CV: Normal S1, S2 without murmurs. Abdomen: Nondistended,  nontender. Skin: Warm and dry, without lesions or rashes. Extremities:  No clubbing, cyanosis or edema. Neuro:   Grossly intact.  Diagnositics/Labs: None today  Assessment and plan:   Nonallergic rhinitis  - environmental allergy panel is negative thus symptoms are non-allergic in nature  - Discussed today given that environmental panel is negative thus her symptoms are driven in a nonallergic fashion.  Discussed that things such as house dust and visible pollens are inhalants that get into the airway via the nose and the mouth and can cause very similar symptoms as if she had allergic driven symptoms including nasal congestion, drainage, sneezing, cough.  These particles such as house dust are irritants to the airway leading to symptoms.    - will have you try Ryvent '6mg'$  2 times a day.   This may be helpful in non-allergic rhinitis.  Try Ryvent sample first over next few days to complete samples as above and if this is effective let us know and can have the prescription filled.    If Ryvent is not effective let us know then will have you try a month trial of Singulair.    - recommend use of nasal saline  spray to help keep the nose moisturized especially with oxygen use  Follow-up 4-6 months or sooner if needed  I appreciate the opportunity to take part in [place] care. Please do not hesitate to contact me with questions.  Sincerely,   Prudy Feeler, MD Allergy/Immunology [facility]

## 2019-07-14 NOTE — Patient Instructions (Addendum)
Nonallergic rhinitis  - environmental allergy panel is negative thus symptoms are non-allergic in nature  - Discussed today given that environmental panel is negative thus her symptoms are driven in a nonallergic fashion.  Discussed that things such as house dust and visible pollens are inhalants that get into the airway via the nose and the mouth and can cause very similar symptoms as if she had allergic driven symptoms including nasal congestion, drainage, sneezing, cough.  These particles such as house dust are irritants to the airway leading to symptoms.    - will have you try Ryvent 6mg  2 times a day.   This may be helpful in non-allergic rhinitis.  Try Ryvent sample first over next few days to complete samples as above and if this is effective let us know and can have the prescription filled.    If Ryvent is not effective let us know then will have you try a month trial of Singulair.    - recommend use of nasal saline spray to help keep the nose moisturized especially with oxygen use  Follow-up 4-6 months or sooner if needed

## 2019-07-19 ENCOUNTER — Other Ambulatory Visit: Payer: Self-pay

## 2019-07-19 ENCOUNTER — Encounter (HOSPITAL_COMMUNITY): Payer: Self-pay | Admitting: *Deleted

## 2019-07-19 ENCOUNTER — Ambulatory Visit (INDEPENDENT_AMBULATORY_CARE_PROVIDER_SITE_OTHER): Payer: Medicare HMO | Admitting: Internal Medicine

## 2019-07-19 ENCOUNTER — Encounter: Payer: Self-pay | Admitting: Internal Medicine

## 2019-07-19 VITALS — BP 118/60 | HR 78 | Temp 97.4°F | Ht 65.0 in | Wt 141.0 lb

## 2019-07-19 DIAGNOSIS — J9611 Chronic respiratory failure with hypoxia: Secondary | ICD-10-CM | POA: Diagnosis not present

## 2019-07-19 DIAGNOSIS — J849 Interstitial pulmonary disease, unspecified: Secondary | ICD-10-CM | POA: Diagnosis not present

## 2019-07-19 DIAGNOSIS — R0981 Nasal congestion: Secondary | ICD-10-CM

## 2019-07-19 DIAGNOSIS — M3213 Lung involvement in systemic lupus erythematosus: Secondary | ICD-10-CM | POA: Diagnosis not present

## 2019-07-19 DIAGNOSIS — M329 Systemic lupus erythematosus, unspecified: Secondary | ICD-10-CM | POA: Diagnosis not present

## 2019-07-19 DIAGNOSIS — D8989 Other specified disorders involving the immune mechanism, not elsewhere classified: Secondary | ICD-10-CM

## 2019-07-19 MED ORDER — TRELEGY ELLIPTA 100-62.5-25 MCG/INH IN AEPB: 1.0000 | INHALATION_SPRAY | Freq: Every day | RESPIRATORY_TRACT | 0 refills | Status: DC

## 2019-07-19 NOTE — Patient Instructions (Addendum)
Order- referral to Pulmonary Rehab--   Lupus, chronic respiratory failure with hypoxia  Order- referral to Dr Chase Caller- Consult for interstitial lung disease, Lupus  Order- DME [facility]   Please add humidifier to home O2 concentrator  Sample x 2 Trelegy inhaler    Inhale 1 puff, then rinse mouth, once daily. Try this instead of Breo, for comparison  Try otc nasal spray  Nasalcrom/ Cromol/ cromolyn   See if it slows the post-nasal drip.  Try otc antihistamine Claritin/ loratadine     See if it slows the drainage.

## 2019-07-19 NOTE — Assessment & Plan Note (Signed)
Flonase burns- she blames old fracture repair. Plan- for drainage, try Nasalcrom, claritin

## 2019-07-19 NOTE — Assessment & Plan Note (Signed)
We discussed trail of Trelegy to see if it would have any effect on sense of chest congestion with productive cough. Plan- samples Trelegy instead of Breo, refer Pulm Rehab, refer to Dr Chase Caller for her concerns about ILD

## 2019-07-19 NOTE — Progress Notes (Signed)
HPI F former smoker originaly referred for allergy evaluation, rhinitis, asthmatic bronchitis, then hosp 2015 for ?ALI with lower zone pneumonitis/ BAL and special studies NEG,. 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 Allergy profile 02/23/2014-negative with total IgE 22.9 HSP panel was neg.  Positive ANA, a speckled positive ANA 1-80 pattern, mildly elevated ESR, non-reactive HIV, and negative PPD EF (25-30%, from baseline 30-25% in 2013).  2015- CXR-bilateral progressive lower lobe peribronchial infiltrates with groundglass changes and nodular pattern consistent with acute pneumonitis of unclear etiology Walk test on room air: 44/22/16: 98%, 92%, 91%, 90% at rest on room air. CT chest 06/28/2015-Severe bilaterally symmetric ground-glass consolidation. ACE level 03/31/2019- 33 wnl -------------------------------------------------------------------------------------------------------------  03/31/2019- 37 yoF former smoker originaly referred for allergy evaluation, rhinitis, asthmatic bronchitis, then hosp 2015 for ?ALI with lower zone pneumonitis/ BAL and special studies NEG,. Lupus/ followed at [facility] for anti-synthetase syndrome, ILD, (anti-Jo-1 antibody, ILD, myositis and elevated CK-very responsive to steroids)Acute on Chronic respiratory failure/ Hypoxemia,.DM Pacemaker for AV block , Pulm HTN, DM2,  chronic dilated cardiomyopathy O2 2-4  L/[employer]  POC -----pt states her breathing is as baseline, currently on O2 3L, taking amoxicillin d/t dental work Variable cough. Panic attacks. Tussive mid-chest pains, non-exertional. CTa x2 neg for PE. CT ?'d Sarcoid- (possibly Lupus changes) iOnly occ  rescue albuterol. CTa chest PE- 02/05/2019-  IMPRESSION: No evidence of pulmonary emboli. 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 No new focal abnormality is seen.  07/19/2019- 64 yoF former smoker originaly referred for allergy evaluation, rhinitis, asthmatic bronchitis, then hosp 2015 for ?ALI with lower zone pneumonitis/ BAL and special studies NEG,. Lupus/ followed at [facility] for anti-synthetase syndrome, ILD, (anti-Jo-1 antibody, ILD, myositis and elevated CK-very responsive to steroids)Acute on Chronic respiratory failure/ Hypoxemia,  Pacemaker for AV block , Pulm HTN, DM2,  chronic dilated cardiomyopathy O2 2-4  L/[facility]  POC Saw TP 8/18.  ED visit for viral pattern 9/5. Progressive SOB>> continuous 4L O2.  SARS neg 9/5    BNP 74.7      D-dimer 0.46   Hgb 8.1    WBC 7,100 Prednisone 10 mg daily, Breo 100, albuterol hfa O2 2-4L -----pt states breathing varies; currently on O2 4L continuous 24/7; reports having sinus/bronchitis-like symptoms C/o persistent malaise, nasal and chest congestion with white mucus, some plugs from nose and chest. O2 concentrator is drying.  No fever, purulence, or blood. Declines flu vax now.  Flonase burns in nose where she had a splint placed after nasal fx.  She only sees Rheum at [facility], pulmonary is here. She asks about management for her ILD, which has been stable on imaging.  Interested in Pulmonary Rehab Breo ineffectve. CXR 06/25/2019- IMPRESSION: Chronic interstitial lung changes. No superimposed airspace consolidation identified.  ROS-see HPI   + = positive Constitutional:   + weight loss, night sweats, +fevers, + chills, fatigue, lassitude. HEENT:   , difficulty swallowing, tooth/dental problems, sore throat,       sneezing, itching, ear ache, nasal congestion, + post nasal drip,  CV:  + chest pain, orthopnea, PND, swelling in lower extremities, anasarca,  dizziness, palpitations Resp: + shortness of breath with exertion or at rest.             + productive cough, + non-productive cough,  No- coughing up of blood.               No- wheezing.   Skin: No-   rash or lesions. GI:   heartburn, indigestion, abdominal pain, nausea, vomiting,  GU:  MS:  No-   joint pain or swelling.   Neuro-     nothing unusual Psych:  No- change in mood or affect. No depression or anxiety.  No memory loss.  OBJ- Physical Exam  96% sat on O2 4L General- Alert, Oriented, Affect-appropriate, +wearing mask , not toxic Skin- +acanthotic hyperpigmentation around neck, +icthyotic scaling on calves Lymphadenopathy- none Head- atraumatic            Eyes- Gross vision intact, PERRLA, conjunctivae and secretions clear            Ears- Hearing, canals-normal            Nose-  no-Septal dev, mucus, polyps, erosion, perforation + snffling            Throat- Mallampati III , mucosa clear , drainage- none, tonsils- atrophic Neck- flexible , trachea midline, no stridor , thyroid nl, carotid no bruit Chest - symmetrical excursion , unlabored           Heart/CV- RRR , no murmur , no gallop  , no rub, nl s1 s2                           - JVD- none , edema- none, stasis changes- none, varices- none           Lung- +few crackles in bases, wheeze+ R apex, cough+  , dullness-none, rub- none. Deep breath > cough           Chest wall-  + pacemaker Abd-  Br/ Gen/ Rectal- Not done, not indicated Extrem- cyanosis- none, clubbing, none, atrophy- none, strength- nl Neuro- grossly intact to observation

## 2019-07-19 NOTE — Assessment & Plan Note (Signed)
Continues O2 2-4L.  [employer] to add humidifier to concentrator for comfort.

## 2019-07-19 NOTE — Assessment & Plan Note (Signed)
We will ask Dr Chase Caller to review her ILD status, attributed to Lupus/ anti-synthetase syndrome.

## 2019-07-19 NOTE — Progress Notes (Signed)
Received referral from Dr. Annamaria Boots for this pt to participate in Pulmonary Rehab with the diagnosis of Chronic Respiratory Failure with Hypoxia.  Pt has history of ILD,pulm HTN and Lupus. Clinical review of pt follow up appt on 07/19/19 Pulmonary office note.  Pt with Covid Risk Score 5.  Pt appropriate for scheduling for Pulmonary rehab.  Will forward to support staff for scheduling and verification of insurance eligibility/benefits with pt  consent. Cherre Huger, BSN Cardiac and Training and development officer

## 2019-07-19 NOTE — Assessment & Plan Note (Signed)
Continues to be followed by Rheumatology at [facility]. Very complex medical history.

## 2019-07-20 ENCOUNTER — Telehealth (HOSPITAL_COMMUNITY): Payer: Self-pay

## 2019-07-20 NOTE — Telephone Encounter (Signed)
Pt insurance is active and benefits verified through [facility] Co-pay 0, DED 0/0 met, out of pocket $6,700/$370.31 met, co-insurance 0. no pre-authorization required, REF# [id number]

## 2019-07-21 ENCOUNTER — Telehealth (HOSPITAL_COMMUNITY): Payer: Self-pay

## 2019-07-21 ENCOUNTER — Ambulatory Visit (INDEPENDENT_AMBULATORY_CARE_PROVIDER_SITE_OTHER): Payer: Medicare HMO | Admitting: *Deleted

## 2019-07-21 DIAGNOSIS — I442 Atrioventricular block, complete: Secondary | ICD-10-CM

## 2019-07-23 LAB — CUP PACEART REMOTE DEVICE CHECK
Battery Remaining Longevity: 5 mo
Battery Voltage: 2.83 V
Brady Statistic AP VP Percent: 0.38 %
Brady Statistic AP VS Percent: 0.01 %
Brady Statistic AS VP Percent: 98.14 %
Brady Statistic AS VS Percent: 1.48 %
Brady Statistic RA Percent Paced: 0.38 %
Brady Statistic RV Percent Paced: 98.02 %
Date Time Interrogation Session: 20201002143453
Implantable Lead Implant Date: 19971017
Implantable Lead Implant Date: 19971017
Implantable Lead Implant Date: 20130520
Implantable Lead Location: 753858
Implantable Lead Location: 753859
Implantable Lead Location: 753860
Implantable Lead Model: 4196
Implantable Pulse Generator Implant Date: 20130520
Lead Channel Impedance Value: 209 Ohm
Lead Channel Impedance Value: 228 Ohm
Lead Channel Impedance Value: 247 Ohm
Lead Channel Impedance Value: 266 Ohm
Lead Channel Impedance Value: 418 Ohm
Lead Channel Impedance Value: 418 Ohm
Lead Channel Impedance Value: 418 Ohm
Lead Channel Impedance Value: 456 Ohm
Lead Channel Impedance Value: 703 Ohm
Lead Channel Pacing Threshold Amplitude: 0.625 V
Lead Channel Pacing Threshold Amplitude: 0.75 V
Lead Channel Pacing Threshold Amplitude: 1.5 V
Lead Channel Pacing Threshold Pulse Width: 0.4 ms
Lead Channel Pacing Threshold Pulse Width: 0.4 ms
Lead Channel Pacing Threshold Pulse Width: 0.4 ms
Lead Channel Sensing Intrinsic Amplitude: 11.375 mV
Lead Channel Sensing Intrinsic Amplitude: 11.375 mV
Lead Channel Sensing Intrinsic Amplitude: 4.125 mV
Lead Channel Sensing Intrinsic Amplitude: 4.125 mV
Lead Channel Setting Pacing Amplitude: 1.75 V
Lead Channel Setting Pacing Amplitude: 2 V
Lead Channel Setting Pacing Amplitude: 2.25 V
Lead Channel Setting Pacing Pulse Width: 0.4 ms
Lead Channel Setting Pacing Pulse Width: 0.4 ms
Lead Channel Setting Sensing Sensitivity: 0.9 mV

## 2019-07-28 ENCOUNTER — Encounter: Payer: Self-pay | Admitting: Cardiology

## 2019-07-28 NOTE — Progress Notes (Signed)
Remote pacemaker transmission.   

## 2019-07-28 NOTE — Addendum Note (Signed)
Addended by: Tiajuana Amass on: 07/28/2019 09:19 AM   Modules accepted: Level of Service

## 2019-08-03 ENCOUNTER — Ambulatory Visit (HOSPITAL_COMMUNITY)
Admission: RE | Admit: 2019-08-03 | Discharge: 2019-08-03 | Disposition: A | Discharge status: Home / Self Care | Payer: Medicare HMO | Source: Ambulatory Visit | Attending: Adult Health | Admitting: Adult Health

## 2019-08-03 ENCOUNTER — Telehealth (HOSPITAL_COMMUNITY): Payer: Self-pay

## 2019-08-03 ENCOUNTER — Other Ambulatory Visit: Payer: Self-pay

## 2019-08-03 ENCOUNTER — Encounter (HOSPITAL_COMMUNITY): Payer: Self-pay

## 2019-08-03 VITALS — BP 100/65 | Wt 141.4 lb

## 2019-08-03 DIAGNOSIS — Z794 Long term (current) use of insulin: Secondary | ICD-10-CM | POA: Insufficient documentation

## 2019-08-03 DIAGNOSIS — Z9981 Dependence on supplemental oxygen: Secondary | ICD-10-CM | POA: Diagnosis not present

## 2019-08-03 DIAGNOSIS — M069 Rheumatoid arthritis, unspecified: Secondary | ICD-10-CM | POA: Insufficient documentation

## 2019-08-03 DIAGNOSIS — R9431 Abnormal electrocardiogram [ECG] [EKG]: Secondary | ICD-10-CM | POA: Diagnosis not present

## 2019-08-03 DIAGNOSIS — R000 Tachycardia, unspecified: Secondary | ICD-10-CM | POA: Diagnosis not present

## 2019-08-03 DIAGNOSIS — J45909 Unspecified asthma, uncomplicated: Secondary | ICD-10-CM | POA: Insufficient documentation

## 2019-08-03 DIAGNOSIS — I110 Hypertensive heart disease with heart failure: Secondary | ICD-10-CM | POA: Diagnosis not present

## 2019-08-03 DIAGNOSIS — Z7951 Long term (current) use of inhaled steroids: Secondary | ICD-10-CM | POA: Diagnosis not present

## 2019-08-03 DIAGNOSIS — Z7952 Long term (current) use of systemic steroids: Secondary | ICD-10-CM | POA: Insufficient documentation

## 2019-08-03 DIAGNOSIS — I428 Other cardiomyopathies: Secondary | ICD-10-CM | POA: Diagnosis not present

## 2019-08-03 DIAGNOSIS — K219 Gastro-esophageal reflux disease without esophagitis: Secondary | ICD-10-CM | POA: Insufficient documentation

## 2019-08-03 DIAGNOSIS — Z8249 Family history of ischemic heart disease and other diseases of the circulatory system: Secondary | ICD-10-CM | POA: Diagnosis not present

## 2019-08-03 DIAGNOSIS — I5023 Acute on chronic systolic (congestive) heart failure: Secondary | ICD-10-CM | POA: Insufficient documentation

## 2019-08-03 DIAGNOSIS — Z87891 Personal history of nicotine dependence: Secondary | ICD-10-CM | POA: Diagnosis not present

## 2019-08-03 DIAGNOSIS — Z950 Presence of cardiac pacemaker: Secondary | ICD-10-CM | POA: Insufficient documentation

## 2019-08-03 DIAGNOSIS — Z79899 Other long term (current) drug therapy: Secondary | ICD-10-CM | POA: Diagnosis not present

## 2019-08-03 DIAGNOSIS — E119 Type 2 diabetes mellitus without complications: Secondary | ICD-10-CM | POA: Diagnosis not present

## 2019-08-03 DIAGNOSIS — E669 Obesity, unspecified: Secondary | ICD-10-CM | POA: Diagnosis not present

## 2019-08-03 DIAGNOSIS — J849 Interstitial pulmonary disease, unspecified: Secondary | ICD-10-CM | POA: Insufficient documentation

## 2019-08-03 LAB — BASIC METABOLIC PANEL
Anion gap: 13 (ref 5–15)
BUN: 9 mg/dL (ref 6–20)
CO2: 22 mmol/L (ref 22–32)
Calcium: 8.8 mg/dL — ABNORMAL LOW (ref 8.9–10.3)
Chloride: 103 mmol/L (ref 98–111)
Creatinine, Ser: 0.95 mg/dL (ref 0.44–1.00)
GFR calc Af Amer: >60 mL/min (ref >60)
GFR calc non Af Amer: >60 mL/min (ref >60)
Glucose, Bld: 142 mg/dL — ABNORMAL HIGH (ref 70–99)
Potassium: 3.9 mmol/L (ref 3.5–5.1)
Sodium: 138 mmol/L (ref 135–145)

## 2019-08-03 LAB — CBC
HCT: 32.4 % — ABNORMAL LOW (ref 36.0–46.0)
Hemoglobin: 8.6 g/dL — ABNORMAL LOW (ref 12.0–15.0)
MCH: 19 pg — ABNORMAL LOW (ref 26.0–34.0)
MCHC: 26.5 g/dL — ABNORMAL LOW (ref 30.0–36.0)
MCV: 71.7 fL — ABNORMAL LOW (ref 80.0–100.0)
Platelets: 420 10*3/uL — ABNORMAL HIGH (ref 150–400)
RBC: 4.52 MIL/uL (ref 3.87–5.11)
RDW: 17.3 % — ABNORMAL HIGH (ref 11.5–15.5)
WBC: 7.5 10*3/uL (ref 4.0–10.5)
nRBC: 0 % (ref 0.0–0.2)

## 2019-08-03 MED ORDER — FUROSEMIDE 40 MG PO TABS: ORAL_TABLET | ORAL | 5 refills | Status: DC

## 2019-08-03 NOTE — Telephone Encounter (Signed)
Pt left vm that she was having problems at home and needed an appt.   When called, pt endorsed that she in short of breath at rest and with activity.  No chest pain. She notes her HR can go up to 130 with activity.  She is taking all medications as prescribed and monitoring diet.  She is outstanding for f/u this month.  Will reach out to office staff if patient can be seen today

## 2019-08-03 NOTE — Patient Instructions (Addendum)
INCREASE Furosemide  to 40 mg twice a day for 3 days, then resume 40 mg daily thereafter  Labs today We will only contact you if something comes back abnormal or we need to make some changes. Otherwise no news is good news!  Your physician recommends that you schedule a follow-up appointment in: 1 week  in the Advanced Practitioners (PA/NP) Clinic    Do the following things EVERYDAY: 1) Weigh yourself in the morning before breakfast. Write it down and keep it in a log. 2) Take your medicines as prescribed 3) Eat low salt foods-Limit salt (sodium) to 2000 mg per day.  4) Stay as active as you can everyday 5) Limit all fluids for the day to less than 2 liters   At the [facility], you and your health needs are our priority. As part of our continuing mission to provide you with exceptional heart care, we have created designated Provider Care Teams. These Care Teams include your primary Cardiologist (physician) and Advanced Practice Providers (APPs- Physician Assistants and Nurse Practitioners) who all work together to provide you with the care you need, when you need it.   You may see any of the following providers on your designated Care Team at your next follow up: [name] Dr Glori Bickers . Dr Loralie Champagne . Darrick Grinder, NP . Lyda Jester, PA   Please be sure to bring in all your medications bottles to every appointment.

## 2019-08-03 NOTE — Telephone Encounter (Signed)
Pt to be seen by NP/PA today in clinic for symptoms. Pt aware of time and gate code.

## 2019-08-03 NOTE — Progress Notes (Addendum)
Advanced Heart Failure Clinic Note   Referring Physician: PCP: Nuala Alpha, DO PCP-Cardiologist: Dr. Aundra Dubin    HPI:  Makayla Vasquez is a 38 y.o. 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  She has a h/o congenital HB and underwent first PPM at age 1. In 2011 had ECHO with EF 35-40%. LV dysfunction felt to be due to RV pacing so upgraded to CRT-P. Echo in 10/15 with EF 25%. She also has concomitant lung disease and this has limited b-blocker dosing.  She follows with Dr. Annamaria Boots for her lung disease. She is a former smoker. Admitted in 8/15 and 9/15 with worsening respiratory status and severe hypoxia. Had extensive w/u. Thought to have a viral pneumonitis complicated by atelectasis and mild edema vs. an autoimmune process. Discharged home on a course of levaquin and prednisone.   She was offered a lung biopsy for further diagnosis versus empiric treatment with steroids. She opted for empiric steroid treatment. She is followed by a rheumatologist and is on Cellcept, prednisone, and tacrolimus. She is on oxygen and night and with activity.   Admission in 5/17 for ?sepsis and management of interstitial lung disease.   She was admitted with PNA in 1/18. Lasix, losartan, and bisoprolol stopped.   Echo in1/19 showed EF 45-50%, with mild to moderately decreased RV systolic function and moderate MR.Echo was done today and reviewed, EF 35-40% with normal RV size and mildly decreased RV systolic function.  Her last visit was a telehealt visit, 03/22/19 due to covid pandemic. At that time, she reported stable NYHA class III symptoms. No increased supplemental O2 requirements and wt was stable. Plan was to f/u again in 4 months.   Today, pt called triage w/ complaints of worsening dyspnea over the last 3 weeks. However she has been able to keep her  supplemental O2 between 2-4 L/min which is her usual baseline. She notes some wheezing and productive cough w/ thick white sputum. Has had some chills but no fever. No sick contacts. No LEE. Not checking home wts due to scale being broken. She is compliant w/ her lasix for the most part but reports that occasionally she may skip a day then take double the following day. She reports good UOP. EKG today shows sinus tach 114 bpm. She admits that she did not take her bisoprolol dose yesterday due to BP being low. Usually takes it at night. BP 100/62 today, SBP usually in the 110s. Denies syncope/ near syncope. No h/o blood clots. Denies recent prolonged travel and no associated pleuritic CP. She also reports h/o anemia but not on supplemental Fe. Optival today in clinic shows reduced thoracic impedence c/w volume overload. Wt is up 4 lb from 137 to 141 lb.     Review of Systems: [y] = yes, [ ] = no   General: Weight gain [ ]; Weight loss [ ]; Anorexia [ ]; Fatigue [ ]; Fever [ ]; Chills [ ]; Weakness [ ]  Cardiac: Chest pain/pressure [ ]; Resting SOB [Y ]; Exertional SOB [ ]; Orthopnea [ ]; Pedal Edema [ ]; Palpitations [ ]; Syncope [ ]; Presyncope [ ]; Paroxysmal nocturnal dyspnea[ ]  Pulmonary: Cough [ ]; Wheezing[ ]; Hemoptysis[ ]; Sputum [ ]; Snoring [ ]  GI: Vomiting[ ]; Dysphagia[ ]; Melena[ ]; Hematochezia [ ]; Heartburn[ ]; Abdominal pain [ ]; Constipation [ ]; Diarrhea [ ];  BRBPR [ ]  GU: Hematuria[ ]; Dysuria [ ]; Nocturia[ ]  Vascular: Pain in legs with walking [ ]; Pain in feet with lying flat [ ]; Non-healing sores [ ]; Stroke [ ]; TIA [ ]; Slurred speech [ ];  Neuro: Headaches[ ]; Vertigo[ ]; Seizures[ ]; Paresthesias[ ];Blurred vision [ ]; Diplopia [ ]; Vision changes [ ]  Ortho/Skin: Arthritis [ ]; Joint pain [ ]; Muscle pain [ ]; Joint swelling [ ]; Back Pain [ ]; Rash [ ]  Psych: Depression[ ]; Anxiety[ ]  Heme: Bleeding problems [ ]; Clotting disorders [ ]; Anemia [ ]  Endocrine:  Diabetes [ ]; Thyroid dysfunction[ ]   Past Medical History:  Diagnosis Date  . Anxiety   . Arthritis    rheumatoid arthritis- mild, no rheumatology care   . Asthma    seasonal allergies   . Asymptomatic LV dysfunction    a. Echo in Dec 2011 with EF 35 to 40%. Felt to be due to paced rhythm. b. EF 25-30% in 07/2014.  . Bipolar affective disorder ([place])   . Cardiac pacemaker    a. Since age 34 in 51. b. Upgrade to BiV in 2013.  [name] Carpal tunnel syndrome of right wrist   . CHF (congestive heart failure) ([place])   . Congenital complete AV block   . Cor pulmonale, chronic ([place]) 04/08/2018  . Depression    bipolar  . Diabetes mellitus without complication ([place])   . GERD (gastroesophageal reflux disease)   . Hypertension   . Interstitial lung disease ([place])   . Lupus ([place])   . Lupus (systemic lupus erythematosus) ([place])   . Obesity   . Pneumonitis    a. a/w hypoxia - inflammatory - large workup 07/2014.  . Presence of permanent cardiac pacemaker   . Seizures ([place])    as a child- from high fever  . Sinus tachycardia     Current Outpatient Medications  Medication Sig Dispense Refill  . albuterol (VENTOLIN HFA) 108 (90 Base) MCG/ACT inhaler Inhale 2 puffs into the lungs every 4 (four) hours as needed for wheezing or shortness of breath. 6.7 Inhaler 0  . Alcohol Swabs (B-D SINGLE USE SWABS REGULAR) PADS 1 application by Does not apply route 4 (four) times daily. 120 each 2  . bisoprolol (ZEBETA) 5 MG tablet Take 1 tablet (5 mg total) by mouth daily. (Patient taking differently: Take 5 mg by mouth at bedtime. ) 90 tablet 1  . Blood Glucose Calibration (ACCU-CHEK AVIVA) SOLN 1 application by In Vitro route 4 (four) times daily. 1 each 0  . blood glucose meter kit and supplies Dispense based on patient and insurance preference. Use up to four times daily as directed. (FOR ICD-10 E10.9, E11.9). 1 each 0  . dextromethorphan-guaiFENesin (ROBITUSSIN-DM) 10-100 MG/5ML liquid TAKE 5 MLS BY MOUTH  EVERY 4 (FOUR) HOURS AS NEEDED FOR COUGH. (Patient taking differently: Take 5 mLs by mouth every 4 (four) hours as needed for cough. ) 118 mL 0  . Etonogestrel (NEXPLANON [place]) Inject 1 Device into the skin once. Every 3 years    . fluticasone furoate-vilanterol (BREO ELLIPTA) 100-25 MCG/INH AEPB Inhale 1 puff into the lungs daily. 1 each 5  . Fluticasone-Umeclidin-Vilant (TRELEGY ELLIPTA) 100-62.5-25 MCG/INH AEPB Inhale 1 puff into the lungs daily. 2 each 0  . furosemide (LASIX) 40 MG tablet TAKE 1 TABLET BY MOUTH ONCE DAILY 30 tablet 5  . LANTUS SOLOSTAR 100 UNIT/ML Solostar Pen INJECT 15 UNITS SUBCUTANEOUSLY EVERY DAY (  Patient taking differently: Inject 15 Units into the skin daily. ) 15 pen 1  . metFORMIN (GLUCOPHAGE) 1000 MG tablet TAKE 1 TABLET BY MOUTH TWICE DAILY WITH A MEAL 90 tablet 2  . mycophenolate (CELLCEPT) 500 MG tablet Take 1,000 mg by mouth 2 (two) times daily.     [name] NOVOLOG FLEXPEN 100 UNIT/ML FlexPen INJECT 8 UNITS INTO THE SKIN 3 TIMES DAILY WITH MEALS. (Patient taking differently: Inject 8 Units into the skin 3 (three) times daily with meals. ) 15 pen 4  . omeprazole (PRILOSEC) 20 MG capsule TAKE 1 CAPSULE BY MOUTH EVERY DAY 90 capsule 1  . OXYGEN Inhale 2-4 L into the lungs continuous.     . potassium chloride SA (K-DUR,KLOR-CON) 20 MEQ tablet Take 1 tablet (20 mEq total) by mouth daily. 90 tablet 3  . predniSONE (DELTASONE) 10 MG tablet Take 10 mg by mouth daily with breakfast.    . tacrolimus (PROGRAF) 0.5 MG capsule Take 1.5 mg by mouth 2 (two) times daily.    . traZODone (DESYREL) 50 MG tablet TAKE 1 TABLET (50 MG TOTAL) BY MOUTH AT BEDTIME AS NEEDED FOR SLEEP. 90 tablet 2  . Carbinoxamine Maleate (RYVENT) 6 MG TABS Take 6 mg by mouth 2 (two) times daily. (Patient not taking: Reported on 08/03/2019) 180 tablet 1   No current facility-administered medications for this encounter.     Allergies  Allergen Reactions  . Atorvastatin Other (See Comments)    Possible  statin-induced myopathy with elevated CK if on Lipitor 40 mg  . Sertraline Hcl Hives  . Tape Other (See Comments)    "Burns" the skin      Social History   Socioeconomic History  . Marital status: Single    Spouse name: Not on file  . Number of children: 1  . Years of education: Not on file  . Highest education level: Not on file  Occupational History  . Occupation: CNA  Social Needs  . Financial resource strain: Not hard at all  . Food insecurity    Worry: Never true    Inability: Never true  . Transportation needs    Medical: No    Non-medical: No  Tobacco Use  . Smoking status: Former Smoker    Packs/day: 0.25    Years: 0.50    Pack years: 0.12    Types: Cigarettes    Quit date: 07/25/1996    Years since quitting: 23.0  . Smokeless tobacco: Never Used  Substance and Sexual Activity  . Alcohol use: No  . Drug use: No  . Sexual activity: Yes    Birth control/protection: Implant  Lifestyle  . Physical activity    Days per week: 0 days    Minutes per session: 0 min  . Stress: To some extent  Relationships  . Social connections    Talks on phone: More than three times a week    Gets together: Twice a week    Attends religious service: 1 to 4 times per year    Active member of club or organization: No    Attends meetings of clubs or organizations: Never    Relationship status: Never married  . Intimate partner violence    Fear of current or ex partner: No    Emotionally abused: No    Physically abused: No    Forced sexual activity: No  Other Topics Concern  . Not on file  Social History Narrative   Works at [employer].  Family History  Problem Relation Age of Onset  . Heart disease Mother        CHF (no details)  . Hypertension Mother   . Lupus Mother   . Heart disease Father        Murmur  . Heart disease Sister 29        No details.  History of a pacemaker    Vitals:   08/03/19 1447  BP: 100/65  Weight: 64.1 kg (141 lb 6.4 oz)      PHYSICAL EXAM: PHYSICAL EXAM: General:  Chronically ill appearing, young AAF. No respiratory difficulty. On supplemental O2 via [place] HEENT: normal Neck: supple. no JVD. Carotids 2+ bilat; no bruits. No lymphadenopathy or thyromegaly appreciated. Cor: PMI nondisplaced. Regular rhythm tachy rate. No rubs, gallops or murmurs. Lungs: clear Abdomen: soft, nontender, nondistended. No hepatosplenomegaly. No bruits or masses. Good bowel sounds. Extremities: no cyanosis, clubbing, rash, edema Neuro: alert & oriented x 3, cranial nerves grossly intact. moves all 4 extremities w/o difficulty. Affect pleasant.  ECG: Sinus tach 114 bpm   ASSESSMENT & PLAN:  1) Acute on Chronic systolic HF: Echo 05/5026 EF 20-25%, echo 1/19 improved with EF 45-50% and mild-moderately decreased systolic function. Echo2/2020 EF 74-12%INOM mild RV systolic dysfunction.Nonischemic cardiomyopathy. -NYHA IIIb chronically.  On home oxygen. - Volume up based on Optivol (decreased thoracic impedence), wt is up 4 lb from 137>>141 lb. symptomatic w/ increased dyspnea.   -Increase Lasix to 40 mg bid x 3 days, then return to 40 mg once daily.  -Check BMP today and again in 1 week on return clinic f/u  -Continuebisoprolol17m qhs. - Would tolerate SBP >/= 85 as long as she is not symptomatic.  2) Anti-synthetase syndrome with interstitial lung disease: This is associated with elevated ant-Jo1 Ab, ILD, and myositis. CK has been persistently elevated. She is followed by pulmonology and rheumatology and is on Cellcept, tacrolimus, and prednisone.   3) Congenital AVN block s/p pacer: followed by Dr TLovena Le  4. Sinus Tachycardia: suspect rebound tachycardia after holding last dose of bisoprolol. Usually takes at night but missed dose last night.  - She was advised to continue to take as directed. Per previous clinic notes, ok to tolerate SBP >85 as long as she is asymptomatic (we are also increasing diuretics).  - no h/o  blood clots and no recent prolonged travel, LEE or CP, doubt PE.  - volume overload may also be contributing. See above.  - has h/o anemia. Will check CBC today - Will also check BMP  -Will plan to f/u again in 1 week. Pt advised to notify [place] condition worsens. If she has persistent tachycardia on return visit despite improved compliance w/  blocker, consider checking d-dimer and TSH.    BLyda Jester PA-C 08/03/19

## 2019-08-05 ENCOUNTER — Telehealth (HOSPITAL_COMMUNITY): Payer: Self-pay

## 2019-08-08 IMAGING — DX DG CHEST 2V
2 series · 2 of 2 positions shown · non-contrast
Comparison: Radiographs 03/10/2017.  CT 01/05/2017

CLINICAL DATA: Productive cough for 2 weeks.  History of lupus.

EXAM:
CHEST  2 VIEW

[chest pa]
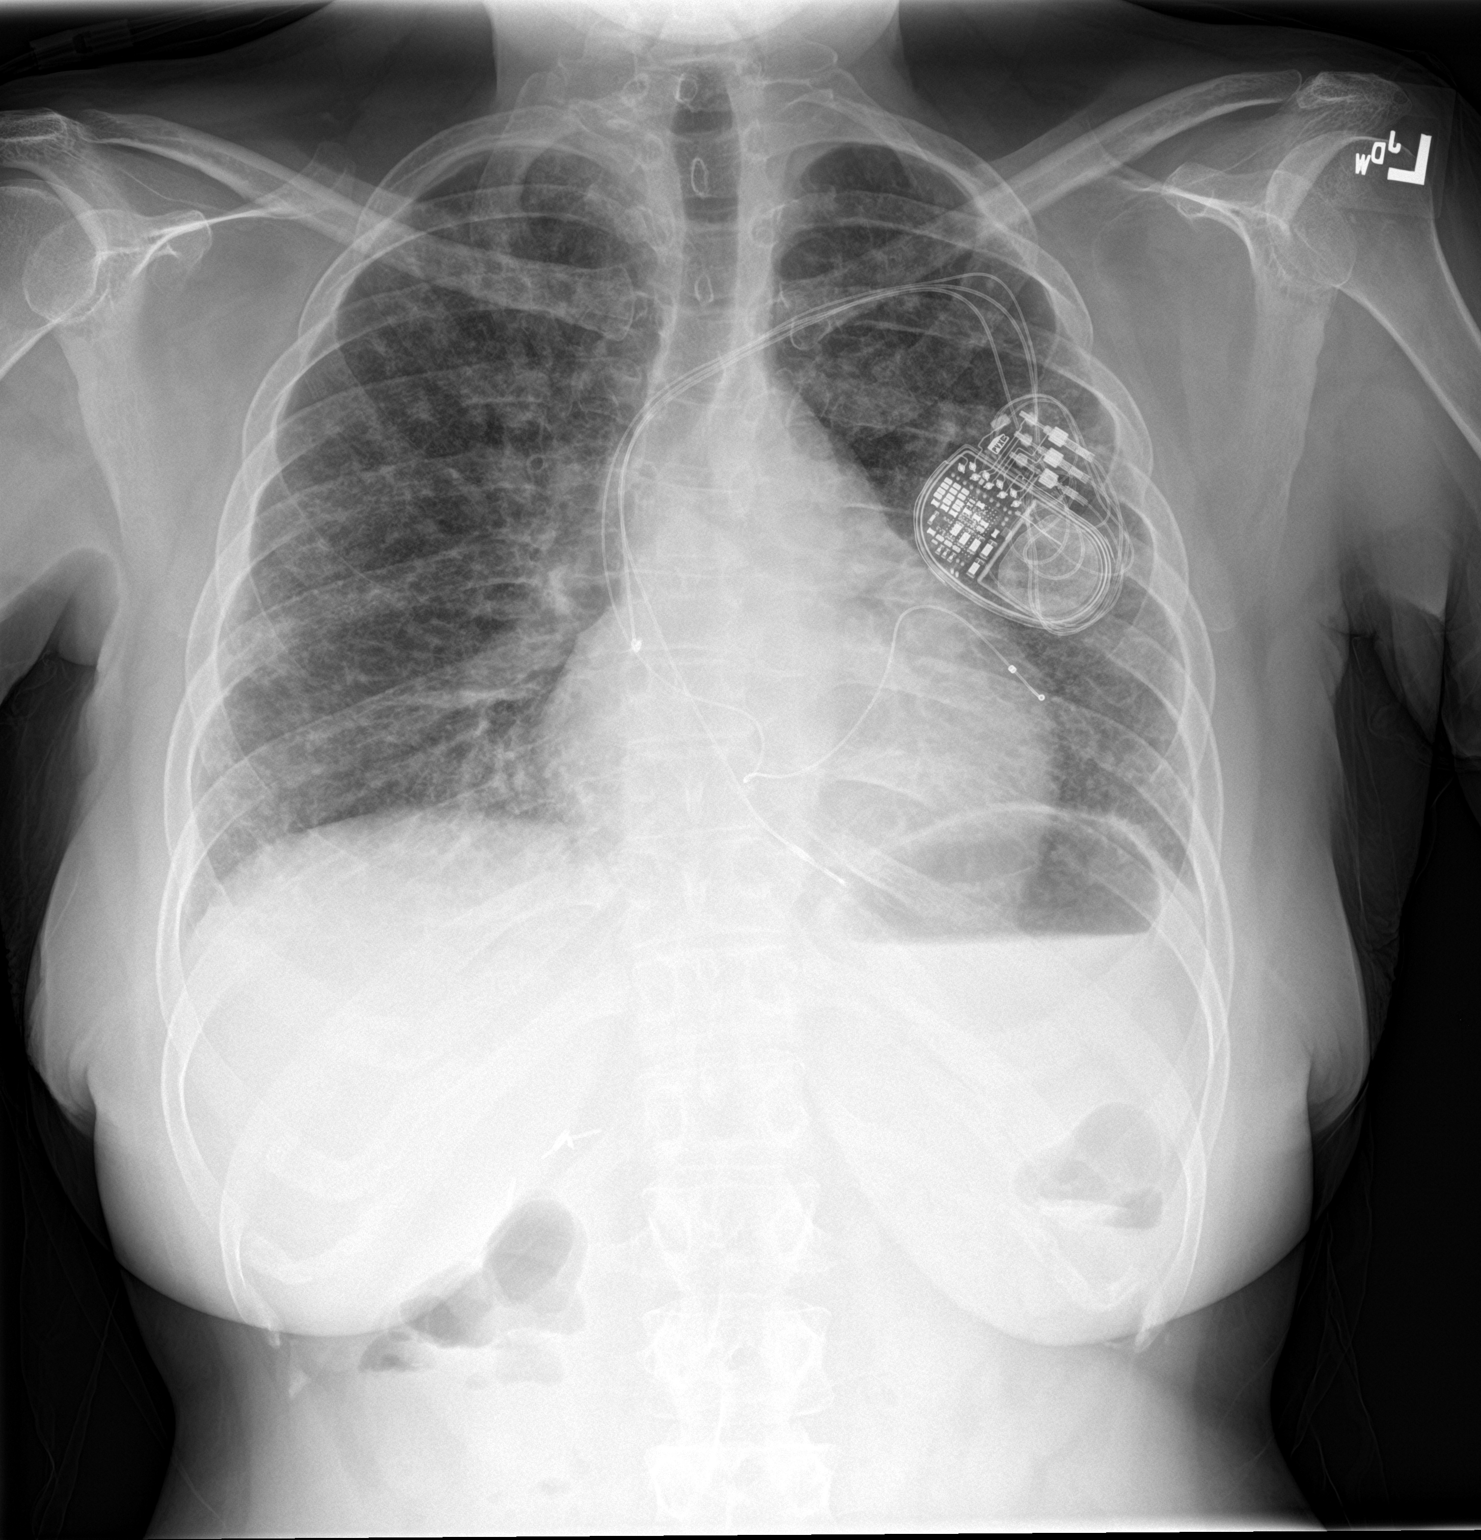

[chest lat]
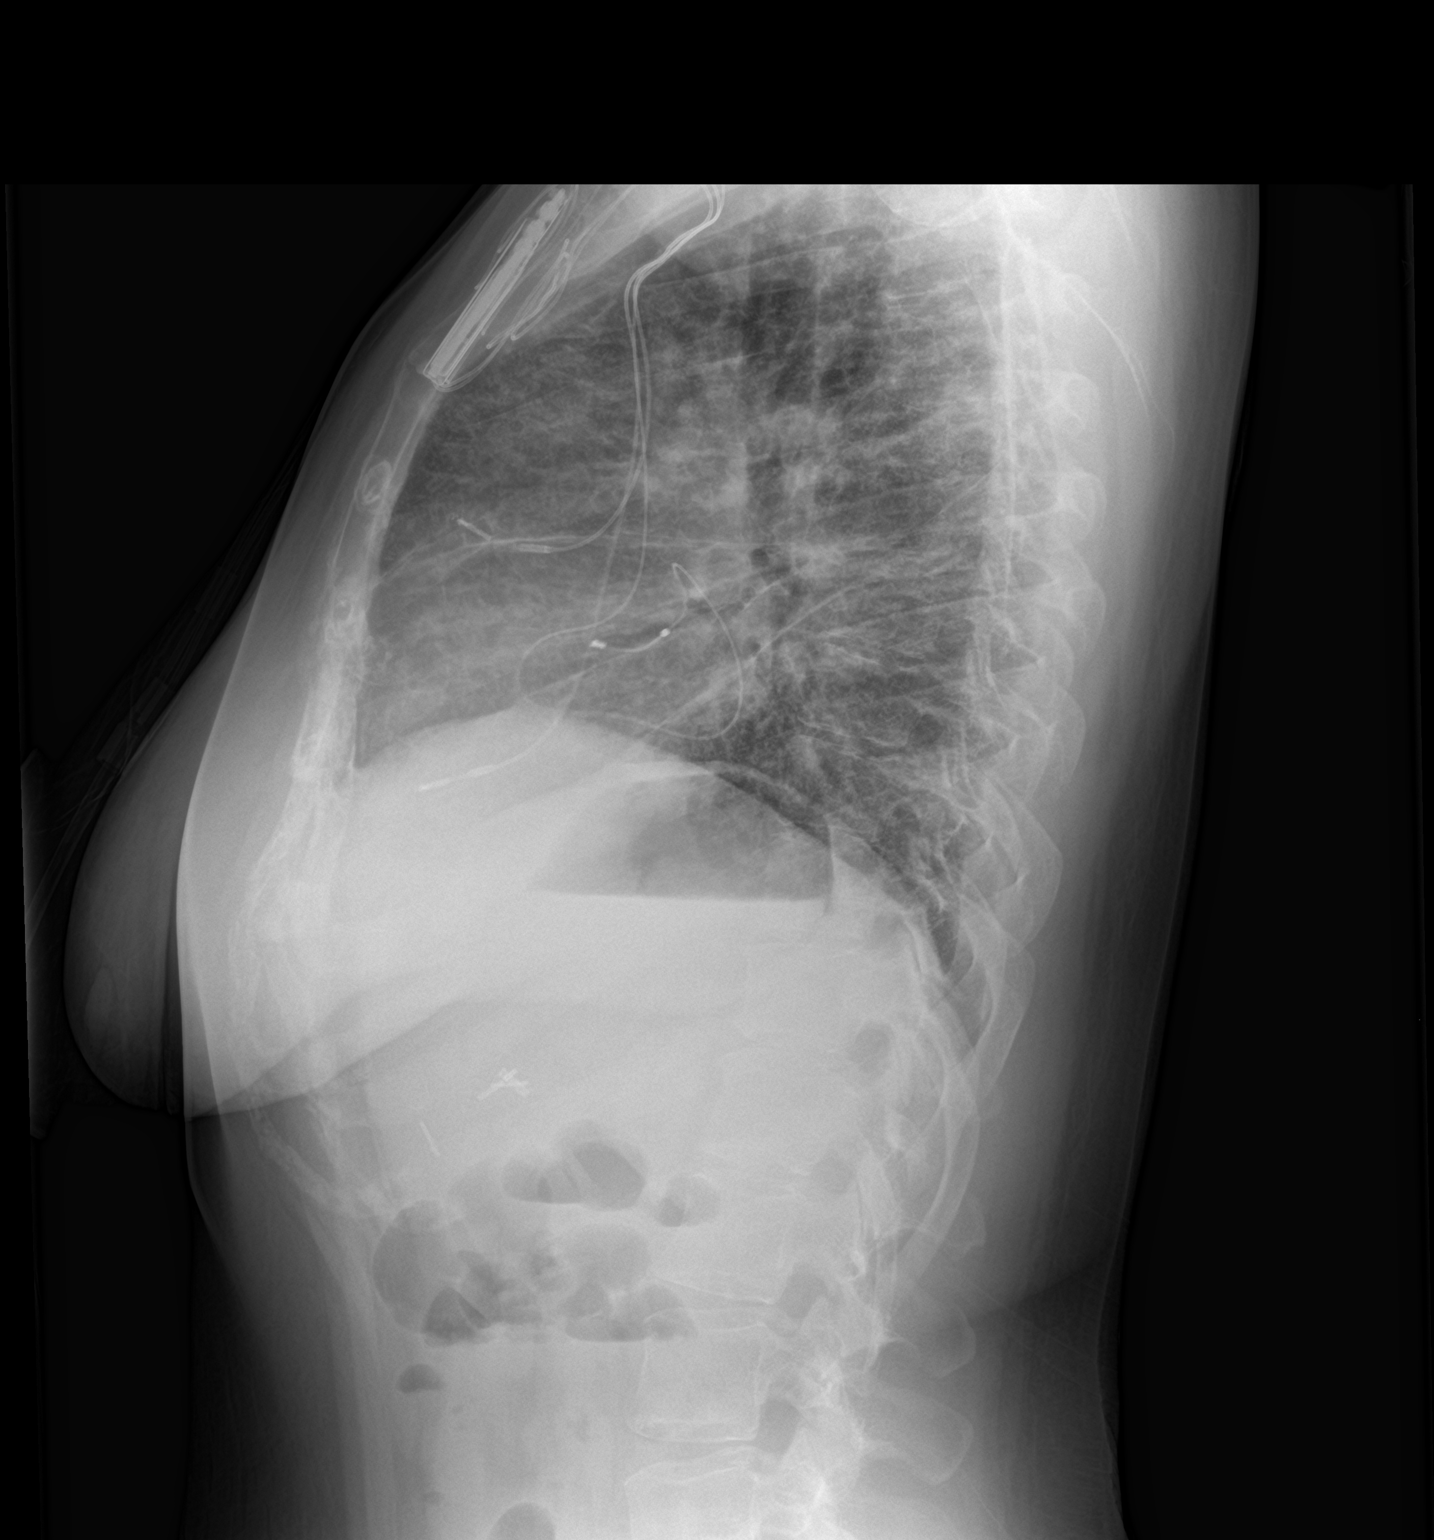

[2 of 2 positions shown; findings below may reference images not displayed]

FINDINGS: Left-sided pacemaker remains in place. Normal heart size and
mediastinal contours. Diffuse reticular opacities throughout both
lungs is similar to prior radiographs and previously characterized
by CT. No significant change. No superimposed consolidation. No
pleural effusion. No pneumothorax. No acute osseous abnormalities
are seen.
IMPRESSION: 1. No acute abnormality.
2. Stable chronic lung disease compared to prior radiographs and CT.

## 2019-08-09 ENCOUNTER — Other Ambulatory Visit: Payer: Self-pay

## 2019-08-09 ENCOUNTER — Encounter (HOSPITAL_COMMUNITY): Payer: Self-pay

## 2019-08-09 ENCOUNTER — Ambulatory Visit (HOSPITAL_COMMUNITY)
Admission: RE | Admit: 2019-08-09 | Discharge: 2019-08-09 | Disposition: A | Discharge status: Home / Self Care | Payer: Medicare HMO | Source: Ambulatory Visit | Attending: Adult Health | Admitting: Adult Health

## 2019-08-09 VITALS — BP 110/60 | HR 72 | Wt 139.2 lb

## 2019-08-09 DIAGNOSIS — K219 Gastro-esophageal reflux disease without esophagitis: Secondary | ICD-10-CM | POA: Insufficient documentation

## 2019-08-09 DIAGNOSIS — Z87891 Personal history of nicotine dependence: Secondary | ICD-10-CM | POA: Diagnosis not present

## 2019-08-09 DIAGNOSIS — I5032 Chronic diastolic (congestive) heart failure: Secondary | ICD-10-CM

## 2019-08-09 DIAGNOSIS — Z79899 Other long term (current) drug therapy: Secondary | ICD-10-CM | POA: Insufficient documentation

## 2019-08-09 DIAGNOSIS — I110 Hypertensive heart disease with heart failure: Secondary | ICD-10-CM | POA: Diagnosis not present

## 2019-08-09 DIAGNOSIS — Z9981 Dependence on supplemental oxygen: Secondary | ICD-10-CM | POA: Insufficient documentation

## 2019-08-09 DIAGNOSIS — J849 Interstitial pulmonary disease, unspecified: Secondary | ICD-10-CM | POA: Diagnosis not present

## 2019-08-09 DIAGNOSIS — E119 Type 2 diabetes mellitus without complications: Secondary | ICD-10-CM | POA: Insufficient documentation

## 2019-08-09 DIAGNOSIS — I428 Other cardiomyopathies: Secondary | ICD-10-CM | POA: Insufficient documentation

## 2019-08-09 DIAGNOSIS — R0600 Dyspnea, unspecified: Secondary | ICD-10-CM | POA: Diagnosis not present

## 2019-08-09 DIAGNOSIS — M069 Rheumatoid arthritis, unspecified: Secondary | ICD-10-CM | POA: Insufficient documentation

## 2019-08-09 DIAGNOSIS — Z8249 Family history of ischemic heart disease and other diseases of the circulatory system: Secondary | ICD-10-CM | POA: Insufficient documentation

## 2019-08-09 DIAGNOSIS — M609 Myositis, unspecified: Secondary | ICD-10-CM | POA: Diagnosis not present

## 2019-08-09 DIAGNOSIS — R0609 Other forms of dyspnea: Secondary | ICD-10-CM

## 2019-08-09 DIAGNOSIS — I5022 Chronic systolic (congestive) heart failure: Secondary | ICD-10-CM | POA: Insufficient documentation

## 2019-08-09 DIAGNOSIS — Z794 Long term (current) use of insulin: Secondary | ICD-10-CM | POA: Diagnosis not present

## 2019-08-09 DIAGNOSIS — J45909 Unspecified asthma, uncomplicated: Secondary | ICD-10-CM | POA: Diagnosis not present

## 2019-08-09 LAB — BASIC METABOLIC PANEL
Anion gap: 13 (ref 5–15)
BUN: 13 mg/dL (ref 6–20)
CO2: 25 mmol/L (ref 22–32)
Calcium: 9.3 mg/dL (ref 8.9–10.3)
Chloride: 100 mmol/L (ref 98–111)
Creatinine, Ser: 0.95 mg/dL (ref 0.44–1.00)
GFR calc Af Amer: >60 mL/min (ref >60)
GFR calc non Af Amer: >60 mL/min (ref >60)
Glucose, Bld: 135 mg/dL — ABNORMAL HIGH (ref 70–99)
Potassium: 4.4 mmol/L (ref 3.5–5.1)
Sodium: 138 mmol/L (ref 135–145)

## 2019-08-09 MED ORDER — FUROSEMIDE 40 MG PO TABS: 40.0000 mg | ORAL_TABLET | Freq: Every day | ORAL | 3 refills | Status: DC

## 2019-08-09 MED ORDER — BISOPROLOL FUMARATE 5 MG PO TABS: 5.0000 mg | ORAL_TABLET | Freq: Every day | ORAL | 1 refills | Status: DC

## 2019-08-09 NOTE — Progress Notes (Signed)
Advanced Heart Failure Clinic Note   PCP: Nuala Alpha, DO PCP-Cardiologist: Dr. Aundra Dubin    HPI: Makayla Vasquez is a 38 y.o. 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  She has a h/o congenital HB and underwent first PPM at age 74. In 2011 had ECHO with EF 35-40%. LV dysfunction felt to be due to RV pacing so upgraded to CRT-P. Echo in 10/15 with EF 25%. She also has concomitant lung disease and this has limited b-blocker dosing.  She follows with Dr. Annamaria Boots for her lung disease. She is a former smoker. Admitted in 8/15 and 9/15 with worsening respiratory status and severe hypoxia. Had extensive w/u. Thought to have a viral pneumonitis complicated by atelectasis and mild edema vs. an autoimmune process. Discharged home on a course of levaquin and prednisone.   She was offered a lung biopsy for further diagnosis versus empiric treatment with steroids. She opted for empiric steroid treatment. She is followed by a rheumatologist and is on Cellcept, prednisone, and tacrolimus. She is on oxygen and night and with activity.   Admission in 5/17 for ?sepsis and management of interstitial lung disease.   She was admitted with PNA in 1/18. Lasix, losartan, and bisoprolol stopped.   Echo in1/19 showed EF 45-50%, with mild to moderately decreased RV systolic function and moderate MR.Echo was done today and reviewed, EF 35-40% with normal RV size and mildly decreased RV systolic function.  Today she returns for HF follow up. Last week she was seen in the HF clinic and was instructed to increase lasix for a few days due to volume overload.  Overall feeling a little better. Remains SOB with exertion but this is her baseline. Denies PND/Orthopnea.Remains on oxygen continuously. Appetite ok. No fever or chills. Not able to weigh because she doesn't have a  scale. Taking all medications but ran out of bisoprolol a few days ago.    Past Medical History:  Diagnosis Date   Anxiety    Arthritis    rheumatoid arthritis- mild, no rheumatology care    Asthma    seasonal allergies    Asymptomatic LV dysfunction    a. Echo in Dec 2011 with EF 35 to 40%. Felt to be due to paced rhythm. b. EF 25-30% in 07/2014.   Bipolar affective disorder [facility])    Cardiac pacemaker    a. Since age 39 in 85. b. Upgrade to BiV in 2013.   Carpal tunnel syndrome of right wrist    CHF (congestive heart failure) (HCC)    Congenital complete AV block    Cor pulmonale, chronic ([place]) 04/08/2018   Depression    bipolar   Diabetes mellitus without complication (HCC)    GERD (gastroesophageal reflux disease)    Hypertension    Interstitial lung disease ([place])    Lupus (HCC)    Lupus (systemic lupus erythematosus) ([place])    Obesity    Pneumonitis    a. a/w hypoxia - inflammatory - large workup 07/2014.   Presence of permanent cardiac pacemaker    Seizures ([place])    as a child- from high fever   Sinus tachycardia     Current Outpatient Medications  Medication Sig Dispense Refill   albuterol (VENTOLIN HFA) 108 (90 Base) MCG/ACT inhaler Inhale 2 puffs into the lungs every 4 (four) hours as needed for wheezing or shortness of breath. 6.7  Inhaler 0   Alcohol Swabs (B-D SINGLE USE SWABS REGULAR) PADS 1 application by Does not apply route 4 (four) times daily. 120 each 2   bisoprolol (ZEBETA) 5 MG tablet Take 1 tablet (5 mg total) by mouth daily. (Patient taking differently: Take 5 mg by mouth at bedtime. ) 90 tablet 1   Blood Glucose Calibration (ACCU-CHEK AVIVA) SOLN 1 application by In Vitro route 4 (four) times daily. 1 each 0   blood glucose meter kit and supplies Dispense based on patient and insurance preference. Use up to four times daily as directed. (FOR ICD-10 E10.9, E11.9). 1 each 0   dextromethorphan-guaiFENesin (ROBITUSSIN-DM) 10-100  MG/5ML liquid TAKE 5 MLS BY MOUTH EVERY 4 (FOUR) HOURS AS NEEDED FOR COUGH. (Patient taking differently: Take 5 mLs by mouth every 4 (four) hours as needed for cough. ) 118 mL 0   Etonogestrel (NEXPLANON [place]) Inject 1 Device into the skin once. Every 3 years     fluticasone furoate-vilanterol (BREO ELLIPTA) 100-25 MCG/INH AEPB Inhale 1 puff into the lungs daily. 1 each 5   Fluticasone-Umeclidin-Vilant (TRELEGY ELLIPTA) 100-62.5-25 MCG/INH AEPB Inhale 1 puff into the lungs daily. 2 each 0   furosemide (LASIX) 40 MG tablet Take 1 tablet (40 mg total) by mouth 2 (two) times daily for 3 days, THEN 1 tablet (40 mg total) daily. 30 tablet 5   LANTUS SOLOSTAR 100 UNIT/ML Solostar Pen INJECT 15 UNITS SUBCUTANEOUSLY EVERY DAY (Patient taking differently: Inject 15 Units into the skin daily. ) 15 pen 1   metFORMIN (GLUCOPHAGE) 1000 MG tablet TAKE 1 TABLET BY MOUTH TWICE DAILY WITH A MEAL 90 tablet 2   mycophenolate (CELLCEPT) 500 MG tablet Take 1,000 mg by mouth 2 (two) times daily.      NOVOLOG FLEXPEN 100 UNIT/ML FlexPen INJECT 8 UNITS INTO THE SKIN 3 TIMES DAILY WITH MEALS. (Patient taking differently: Inject 8 Units into the skin 3 (three) times daily with meals. ) 15 pen 4   omeprazole (PRILOSEC) 20 MG capsule TAKE 1 CAPSULE BY MOUTH EVERY DAY 90 capsule 1   OXYGEN Inhale 2-4 L into the lungs continuous.      potassium chloride SA (K-DUR,KLOR-CON) 20 MEQ tablet Take 1 tablet (20 mEq total) by mouth daily. 90 tablet 3   predniSONE (DELTASONE) 10 MG tablet Take 10 mg by mouth daily with breakfast.     tacrolimus (PROGRAF) 0.5 MG capsule Take 1.5 mg by mouth 2 (two) times daily.     traZODone (DESYREL) 50 MG tablet TAKE 1 TABLET (50 MG TOTAL) BY MOUTH AT BEDTIME AS NEEDED FOR SLEEP. 90 tablet 2   No current facility-administered medications for this encounter.     Allergies  Allergen Reactions   Atorvastatin Other (See Comments)    Possible statin-induced myopathy with elevated CK if on  Lipitor 40 mg   Sertraline Hcl Hives   Tape Other (See Comments)    "Burns" the skin      Social History   Socioeconomic History   Marital status: Single    Spouse name: Not on file   Number of children: 1   Years of education: Not on file   Highest education level: Not on file  Occupational History   Occupation: CNA  Social Designer, fashion/clothing strain: Not hard at all   Food insecurity    Worry: Never true    Inability: Never true   Transportation needs    Medical: No    Non-medical: No  Tobacco  Use   Smoking status: Former Smoker    Packs/day: 0.25    Years: 0.50    Pack years: 0.12    Types: Cigarettes    Quit date: 07/25/1996    Years since quitting: 23.0   Smokeless tobacco: Never Used  Substance and Sexual Activity   Alcohol use: No   Drug use: No   Sexual activity: Yes    Birth control/protection: Implant  Lifestyle   Physical activity    Days per week: 0 days    Minutes per session: 0 min   Stress: To some extent  Relationships   Social connections    Talks on phone: More than three times a week    Gets together: Twice a week    Attends religious service: 1 to 4 times per year    Active member of club or organization: No    Attends meetings of clubs or organizations: Never    Relationship status: Never married   Intimate partner violence    Fear of current or ex partner: No    Emotionally abused: No    Physically abused: No    Forced sexual activity: No  Other Topics Concern   Not on file  Social History Narrative   Works at [employer].        Family History  Problem Relation Age of Onset   Heart disease Mother        CHF (no details)   Hypertension Mother    Lupus Mother    Heart disease Father        Murmur   Heart disease Sister 79        No details.  History of a pacemaker    Vitals:   08/09/19 1417  BP: 110/60  Pulse: 72  SpO2: 96%  Weight: 63.1 kg (139 lb 3.2 oz)    Wt Readings from Last 3  Encounters:  08/09/19 63.1 kg (139 lb 3.2 oz)  08/03/19 64.1 kg (141 lb 6.4 oz)  07/19/19 64 kg (141 lb)    PHYSICAL EXAM: General: Appears chronically ill. No resp difficulty HEENT: normal Neck: supple. no JVD. Carotids 2+ bilat; no bruits. No lymphadenopathy or thryomegaly appreciated. Cor: PMI nondisplaced. Regular rate & rhythm. No rubs, gallops or murmurs. Lungs: clear on 2 liters oxygen.  Abdomen: soft, nontender, nondistended. No hepatosplenomegaly. No bruits or masses. Good bowel sounds. Extremities: no cyanosis, clubbing, rash, edema Neuro: alert & orientedx3, cranial nerves grossly intact. moves all 4 extremities w/o difficulty. Affect pleasant   ASSESSMENT & PLAN:  1) Chronic systolic HF: Echo 02/6255 EF 20-25%, echo 1/19 improved with EF 45-50% and mild-moderately decreased systolic function. Echo2/2020 EF 38-93%TDSK mild RV systolic dysfunction.Nonischemic cardiomyopathy. -NYHA IIIb chronically.  On home oxygen. - Volume status appears stable despite Optivol Reading. Continue lasix 40 mg daily.  - Check BMET today.  -Continuebisoprolol77m qhs. Refill bisoprolol .She has been out of bisoprolol for a few days. We discussed the importance of refilling her medications.  - Would not add spiro or losartan given her ongoing hypotension when she is taking bisoprolol.   - Will see if we can get a scale delivered to her house.   2) Anti-synthetase syndrome with interstitial lung disease: This is associated with elevated ant-Jo1 Ab, ILD, and myositis. CK has been persistently elevated. She is followed by pulmonology and rheumatology and is on Cellcept, tacrolimus, and prednisone.   3) Congenital AVN block s/p pacer: followed by Dr TLovena Le  Follow up 3 months with  Dr Aundra Dubin. Greater than 50% of the (total minutes 25) visit spent in counseling/coordination of care regarding the above. Discussed Optivol and  Medications.        Darrick Grinder, NP 08/09/19

## 2019-08-09 NOTE — Patient Instructions (Signed)
Lab work done today. We will notify you of any abnormal lab work. No news is good news!   REFILLED Bisoprolol  DECREASE Furosemide to 40mg  daily.  Please follow up with the Advanced Heart Failure Clinic 3 months.  At the [facility], you and your health needs are our priority. As part of our continuing mission to provide you with exceptional heart care, we have created designated Provider Care Teams. These Care Teams include your primary Cardiologist (physician) and Advanced Practice Providers (APPs- Physician Assistants and Nurse Practitioners) who all work together to provide you with the care you need, when you need it.   You may see any of the following providers on your designated Care Team at your next follow up: [name] Dr Glori Bickers . Dr Loralie Champagne . Darrick Grinder, NP . Lyda Jester, PA   Please be sure to bring in all your medications bottles to every appointment.

## 2019-08-11 ENCOUNTER — Other Ambulatory Visit: Payer: Self-pay

## 2019-08-11 ENCOUNTER — Ambulatory Visit (INDEPENDENT_AMBULATORY_CARE_PROVIDER_SITE_OTHER): Payer: Medicare HMO | Admitting: Family Medicine

## 2019-08-11 ENCOUNTER — Encounter: Payer: Self-pay | Admitting: Family Medicine

## 2019-08-11 VITALS — BP 100/60 | HR 97 | Ht 65.0 in | Wt 138.5 lb

## 2019-08-11 DIAGNOSIS — D649 Anemia, unspecified: Secondary | ICD-10-CM

## 2019-08-11 MED ORDER — FERROUS SULFATE 325 (65 FE) MG PO TBEC: 325.0000 mg | DELAYED_RELEASE_TABLET | Freq: Every day | ORAL | 1 refills | Status: AC

## 2019-08-11 NOTE — Progress Notes (Signed)
Subjective: Chief Complaint  Patient presents with  . Medication Refill    HPI: Makayla Vasquez is a 38 y.o. presenting to clinic today to discuss the following:  Anemia Patient states she was told she had "low iron" and was anemic. Unclear how this information was obtained as I saw no recent iron studies in her records. She overall is having some difficulty breathing but this is baseline for her. She is on her home oxygen at regular dose with normal vitals. She is willing to take iron supplementation if she needs it. No additional fatigue.  ROS noted in HPI.    Social History   Tobacco Use  Smoking Status Former Smoker  . Packs/day: 0.25  . Years: 0.50  . Pack years: 0.12  . Types: Cigarettes  . Quit date: 07/25/1996  . Years since quitting: 23.0  Smokeless Tobacco Never Used    Objective: BP 100/60   Pulse 97   Ht 5\' 5"  (1.651 m)   Wt 138 lb 8 oz (62.8 kg)   SpO2 100%   BMI 23.05 kg/m  Vitals and nursing notes reviewed  Physical Exam Gen: Alert and Oriented x 3, NAD CV: RRR, no murmurs, normal S1, S2 split Resp: diffuse poor airflow with diffuse wheezing, no crackles Ext: no clubbing, cyanosis, or edema Skin: warm, dry, intact, no rashes  Results for orders placed or performed during the hospital encounter of 08/09/19 (from the past 72 hour(s))  Basic Metabolic Panel (BMET)     Status: Abnormal   Collection Time: 08/09/19  2:37 PM  Result Value Ref Range   Sodium 138 135 - 145 mmol/L   Potassium 4.4 3.5 - 5.1 mmol/L   Chloride 100 98 - 111 mmol/L   CO2 25 22 - 32 mmol/L   Glucose, Bld 135 (H) 70 - 99 mg/dL   BUN 13 6 - 20 mg/dL   Creatinine, Ser 08/11/19 0.44 - 1.00 mg/dL   Calcium 9.3 8.9 - 0.71 mg/dL   GFR calc non Af Amer >60 >60 mL/min   GFR calc Af Amer >60 >60 mL/min   Anion gap 13 5 - 15    Comment: Performed at [facility], [address], [place], [place]   *Note: Due to a large number of results and/or encounters for the  requested time period, some results have not been displayed. A complete set of results can be found in Results Review.    Assessment/Plan:  Anemia CBC and Iron studies consistent with a mix of anemia of chronic disease with iron deficiency as Iron sat is 5%. For patient difficulty breathing I advised her to keep pulmonology appointment today and follow up with [id number] if needed - Started oral iron 325mg  daily in am - f/u labs in 2 months   PATIENT EDUCATION PROVIDED: See AVS    Diagnosis and plan along with any newly prescribed medication(s) were discussed in detail with this patient today. The patient verbalized understanding and agreed with the plan. Patient advised if symptoms worsen return to clinic or ER.    Orders Placed This Encounter  Procedures  . Ferritin  . Vitamin B12  . Folate  . CBC    Standing Status:   Future    Standing Expiration Date:   10/11/2019  . Iron and TIBC    Meds ordered this encounter  Medications  . ferrous sulfate 325 (65 FE) MG EC tablet    Sig: Take 1 tablet (325 mg  total) by mouth daily with breakfast.    Dispense:  90 tablet    Refill:  [place], DO 08/11/2019, 3:12 PM PGY-3 [place]

## 2019-08-11 NOTE — Patient Instructions (Addendum)
It was great to see you today! Thank you for letting me participate in your care!  Today, we discussed your low hemoglobin also known as anemia. I am getting some more labs and if abnormal I will call you. I have sent the iron pills to the pharmacy. Please take as directed.  Be well, Harolyn Rutherford, DO PGY-3, [facility]

## 2019-08-12 LAB — VITAMIN B12: Vitamin B-12: 302 pg/mL (ref 232–1245)

## 2019-08-12 LAB — IRON AND TIBC
Iron Saturation: 5 % — CL (ref 15–55)
Iron: 19 ug/dL — ABNORMAL LOW (ref 27–159)
Total Iron Binding Capacity: 407 ug/dL (ref 250–450)
UIBC: 388 ug/dL (ref 131–425)

## 2019-08-12 LAB — FOLATE: Folate: 10.5 ng/mL (ref >3.0)

## 2019-08-12 LAB — FERRITIN: Ferritin: 43 ng/mL (ref 15–150)

## 2019-08-15 ENCOUNTER — Encounter: Payer: Self-pay | Admitting: Internal Medicine

## 2019-08-15 ENCOUNTER — Other Ambulatory Visit: Payer: Self-pay | Admitting: Family Medicine

## 2019-08-15 ENCOUNTER — Ambulatory Visit (INDEPENDENT_AMBULATORY_CARE_PROVIDER_SITE_OTHER): Payer: Medicare HMO | Admitting: Internal Medicine

## 2019-08-15 ENCOUNTER — Other Ambulatory Visit: Payer: Self-pay

## 2019-08-15 DIAGNOSIS — M3213 Lung involvement in systemic lupus erythematosus: Secondary | ICD-10-CM

## 2019-08-15 DIAGNOSIS — D649 Anemia, unspecified: Secondary | ICD-10-CM | POA: Diagnosis not present

## 2019-08-15 DIAGNOSIS — R0981 Nasal congestion: Secondary | ICD-10-CM | POA: Diagnosis not present

## 2019-08-15 DIAGNOSIS — Z23 Encounter for immunization: Secondary | ICD-10-CM | POA: Diagnosis not present

## 2019-08-15 DIAGNOSIS — J9611 Chronic respiratory failure with hypoxia: Secondary | ICD-10-CM | POA: Diagnosis not present

## 2019-08-15 MED ORDER — LORATADINE 10 MG PO TABS: 10.0000 mg | ORAL_TABLET | Freq: Every day | ORAL | 11 refills | Status: AC

## 2019-08-15 MED ORDER — CROMOLYN SODIUM 5.2 MG/ACT NA AERS: 1.0000 | INHALATION_SPRAY | Freq: Four times a day (QID) | NASAL | 12 refills | Status: AC

## 2019-08-15 NOTE — Patient Instructions (Addendum)
Scripts sent for nasalcrom nasal spray and for loratadine antihistamine. Hope these can help your runny nose  Continue oxygen and your other meds as before.  You can keep the December appointment  Flu vax standard

## 2019-08-15 NOTE — Progress Notes (Signed)
HPI F former smoker originaly referred for allergy evaluation, rhinitis, asthmatic bronchitis, then hosp 2015 for ?ALI with lower zone pneumonitis/ BAL and special studies NEG,. Lupus, folowed at [facility] Rheum for Anti-Synthetase syndrome,  (anti-Jo-1 antibody, ILD, myositis and elevated CK-very responsive to steroids) Acute on Chronic respiratory failure/ Hypoxemia, ILD, DM2,  Pacemaker for AV block, pulmonary hypertension, chronic dilated cardiomyopathy She tried allergy shots for 2 months,  but had strong local reactions. Allergy profile 02/23/2014-negative with total IgE 22.9 HSP panel was neg.  Positive ANA, a speckled positive ANA 1-80 pattern, mildly elevated ESR, non-reactive HIV, and negative PPD EF (25-30%, from baseline 30-25% in 2013).  2015- CXR-bilateral progressive lower lobe peribronchial infiltrates with groundglass changes and nodular pattern consistent with acute pneumonitis of unclear etiology Walk test on room air: 44/22/16: 98%, 92%, 91%, 90% at rest on room air. CT chest 06/28/2015-Severe bilaterally symmetric ground-glass consolidation. ACE level 03/31/2019- 33 wnl --------------------------------------------------------------------------------------------------------  07/19/2019- 38 yoF former smoker originaly referred for allergy evaluation, rhinitis, asthmatic bronchitis, then hosp 2015 for ?ALI with lower zone pneumonitis/ BAL and special studies NEG,. Lupus/ followed at [facility] for anti-synthetase syndrome, ILD, (anti-Jo-1 antibody, ILD, myositis and elevated CK-very responsive to steroids)Acute on Chronic respiratory failure/ Hypoxemia,  Pacemaker for AV block , Pulm HTN, DM2,  chronic dilated cardiomyopathy O2 2-4  L/[facility]  POC Saw TP 8/18.  ED visit for viral pattern 9/5. Progressive SOB>> continuous 4L O2.  SARS neg 9/5    BNP 74.7      D-dimer 0.46   Hgb 8.1    WBC 7,100 Prednisone 10 mg daily, Breo 100, albuterol hfa O2 2-4L -----pt states breathing varies; currently  on O2 4L continuous 24/7; reports having sinus/bronchitis-like symptoms C/o persistent malaise, nasal and chest congestion with white mucus, some plugs from nose and chest. O2 concentrator is drying.  No fever, purulence, or blood. Declines flu vax now.  Flonase burns in nose where she had a splint placed after nasal fx.  She only sees Rheum at [facility], pulmonary is here. She asks about management for her ILD, which has been stable on imaging.  Interested in Pulmonary Rehab Breo ineffectve. CXR 06/25/2019- IMPRESSION: Chronic interstitial lung changes. No superimposed airspace consolidation identified.  08/15/2019- 19 yoF former smoker originaly referred for allergy evaluation, rhinitis, asthmatic bronchitis, then hosp 2015 for ?ALI with lower zone pneumonitis/ BAL and special studies NEG,. Lupus/ followed at [facility] for anti-synthetase syndrome, ILD, (anti-Jo-1 antibody, ILD, myositis and elevated CK-very responsive to steroids) Acute on Chronic respiratory failure/ Hypoxemia,  Pacemaker for AV block , Pulm HTN, DM2,  chronic dilated cardiomyopathy O2 2-4  L/[employer]  POC -----pt on O2 4L continuous, DME: [employer]; pt reports increased DOE, congestion, runny nose d/t seasonal allergies At last visit we referred to [facility], added humidifier to her O2 concentrator, suggested nasalcrom and claritin and referred to Dr Chase Caller for review of her ILD status since she is not seeing [facility] pulmonary for this.  Anemia being managed - Hgb 8.6. Appointment with Dr Chase Caller being rescheduled. Denies acute change or new problems.  ROS-see HPI   + = positive Constitutional:   + weight loss, night sweats, +fevers, + chills, fatigue, lassitude. HEENT:   , difficulty swallowing, tooth/dental problems, sore throat,       sneezing, itching, ear ache, nasal congestion, + post nasal drip,  CV:  + chest pain, orthopnea, PND, swelling in lower extremities, anasarca,  dizziness, palpitations Resp: + shortness of breath with exertion or at rest.             + productive cough, + non-productive cough,  No- coughing up of blood.               No- wheezing.   Skin: No-   rash or lesions. GI:   heartburn, indigestion, abdominal pain, nausea, vomiting,  GU:  MS:  No-   joint pain or swelling.   Neuro-     nothing unusual Psych:  No- change in mood or affect. No depression or anxiety.  No memory loss.  OBJ- Physical Exam  100% sat on O2 4L General- Alert, Oriented, Affect-appropriate, +wearing mask , not toxic Skin- +acanthotic hyperpigmentation around neck, +icthyotic scaling on calves Lymphadenopathy- none Head- atraumatic            Eyes- Gross vision intact, PERRLA, conjunctivae and secretions clear            Ears- Hearing, canals-normal            Nose-  no-Septal dev, mucus, polyps, erosion, perforation, + snffling            Throat- Mallampati III , mucosa clear , drainage- none, tonsils- atrophic Neck- flexible , trachea midline, no stridor , thyroid nl, carotid no bruit Chest - symmetrical excursion , unlabored           Heart/CV- RRR , no murmur , no gallop  , no rub, nl s1 s2                           - JVD- none , edema- none, stasis changes- none, varices- none           Lung- +few crackles in bases, wheeze- none, cough+  , dullness-none, rub- none. Deep breath > cough           Chest wall-  + pacemaker Abd-  Br/ Gen/ Rectal- Not done, not indicated Extrem- cyanosis- none, clubbing, none, atrophy- none, strength- nl Neuro- grossly intact to observation

## 2019-08-15 NOTE — Assessment & Plan Note (Signed)
CBC and Iron studies consistent with a mix of anemia of chronic disease with iron deficiency as Iron sat is 5%. For patient difficulty breathing I advised her to keep pulmonology appointment today and follow up with [place] if needed - Started oral iron 325mg  daily in am - f/u labs in 2 months

## 2019-08-21 NOTE — Assessment & Plan Note (Signed)
Explained how anemia aggravates low oxygen levels. She continues to work with her other providers for this.

## 2019-08-21 NOTE — Assessment & Plan Note (Signed)
She is comfortable on 4 L O2 with little change from day to day.

## 2019-08-21 NOTE — Assessment & Plan Note (Signed)
Ok to use nasalcrom, claritin

## 2019-08-21 NOTE — Assessment & Plan Note (Signed)
Some interstitial changes.  Plan- reschedule referral for consideration of ILD by Dr Chase Caller

## 2019-08-22 ENCOUNTER — Ambulatory Visit (INDEPENDENT_AMBULATORY_CARE_PROVIDER_SITE_OTHER): Payer: Medicare HMO | Admitting: *Deleted

## 2019-08-22 DIAGNOSIS — I5032 Chronic diastolic (congestive) heart failure: Secondary | ICD-10-CM

## 2019-08-22 DIAGNOSIS — I420 Dilated cardiomyopathy: Secondary | ICD-10-CM

## 2019-08-22 LAB — CUP PACEART REMOTE DEVICE CHECK
Battery Remaining Longevity: 4 mo
Battery Voltage: 2.83 V
Brady Statistic AP VP Percent: 0.37 %
Brady Statistic AP VS Percent: 0 %
Brady Statistic AS VP Percent: 98.78 %
Brady Statistic AS VS Percent: 0.85 %
Brady Statistic RA Percent Paced: 0.37 %
Brady Statistic RV Percent Paced: 98.78 %
Date Time Interrogation Session: 20201102092158
Implantable Lead Implant Date: 19971017
Implantable Lead Implant Date: 19971017
Implantable Lead Implant Date: 20130520
Implantable Lead Location: 753858
Implantable Lead Location: 753859
Implantable Lead Location: 753860
Implantable Lead Model: 4196
Implantable Pulse Generator Implant Date: 20130520
Lead Channel Impedance Value: 209 Ohm
Lead Channel Impedance Value: 228 Ohm
Lead Channel Impedance Value: 247 Ohm
Lead Channel Impedance Value: 266 Ohm
Lead Channel Impedance Value: 399 Ohm
Lead Channel Impedance Value: 418 Ohm
Lead Channel Impedance Value: 437 Ohm
Lead Channel Impedance Value: 456 Ohm
Lead Channel Impedance Value: 703 Ohm
Lead Channel Pacing Threshold Amplitude: 0.625 V
Lead Channel Pacing Threshold Amplitude: 0.75 V
Lead Channel Pacing Threshold Amplitude: 1.375 V
Lead Channel Pacing Threshold Pulse Width: 0.4 ms
Lead Channel Pacing Threshold Pulse Width: 0.4 ms
Lead Channel Pacing Threshold Pulse Width: 0.4 ms
Lead Channel Sensing Intrinsic Amplitude: 11.375 mV
Lead Channel Sensing Intrinsic Amplitude: 11.375 mV
Lead Channel Sensing Intrinsic Amplitude: 4 mV
Lead Channel Sensing Intrinsic Amplitude: 4 mV
Lead Channel Setting Pacing Amplitude: 1.75 V
Lead Channel Setting Pacing Amplitude: 2 V
Lead Channel Setting Pacing Amplitude: 2 V
Lead Channel Setting Pacing Pulse Width: 0.4 ms
Lead Channel Setting Pacing Pulse Width: 0.4 ms
Lead Channel Setting Sensing Sensitivity: 0.9 mV

## 2019-08-24 ENCOUNTER — Telehealth: Payer: Self-pay | Admitting: Obstetrics & Gynecology

## 2019-08-24 NOTE — Telephone Encounter (Signed)
A call was placed to Makayla Vasquez to inform her about her appointment, and how important it is to have on her mask when she arrives. I had to leave her a voice message to call us back so that we might discuss this with her.

## 2019-08-25 ENCOUNTER — Ambulatory Visit (INDEPENDENT_AMBULATORY_CARE_PROVIDER_SITE_OTHER): Payer: Medicare HMO | Admitting: Obstetrics & Gynecology

## 2019-08-25 ENCOUNTER — Other Ambulatory Visit: Payer: Self-pay

## 2019-08-25 ENCOUNTER — Encounter: Payer: Self-pay | Admitting: Obstetrics & Gynecology

## 2019-08-25 VITALS — BP 102/73 | HR 111 | Ht 65.0 in | Wt 144.0 lb

## 2019-08-25 DIAGNOSIS — Z3046 Encounter for surveillance of implantable subdermal contraceptive: Secondary | ICD-10-CM

## 2019-08-25 MED ORDER — ETONOGESTREL 68 MG ~~LOC~~ IMPL
68.0000 mg | DRUG_IMPLANT | Freq: Once | SUBCUTANEOUS | Status: AC
  Administered 2019-08-25: 14:00:00 68 mg via SUBCUTANEOUS

## 2019-08-25 NOTE — Patient Instructions (Signed)
Nexplanon Instructions After Insertion  Keep bandage clean and dry for 24 hours  May use ice/Tylenol/Ibuprofen for soreness or pain  If you develop fever, drainage or increased warmth from incision site-contact office immediately   

## 2019-08-25 NOTE — Progress Notes (Signed)
     GYNECOLOGY OFFICE PROCEDURE NOTE  Makayla Vasquez is a 38 y.o. G1P1001 here for Nexplanon removal and reinsertion.  Last pap smear was on 02/11/2018 and was normal.  No other gynecologic concerns.  Nexplanon Removal and Reinsertion Patient identified, informed consent performed, consent signed.   Patient does understand that irregular bleeding is a very common side effect of this medication. She was advised to have backup contraception for one week after replacement of the implant. Pregnancy test in clinic today was negative.  Appropriate time out taken. Nexplanon site identified in right arm.  Area prepped in usual sterile fashon. One ml of 1% lidocaine was used to anesthetize the area at the distal end of the implant. A small stab incision was made right beside the implant on the distal portion. The Nexplanon rod was grasped using hemostats and removed without difficulty. There was minimal blood loss. There were no complications. Area was then injected with 3 ml of 1 % lidocaine. She was re-prepped with betadine, Nexplanon removed from packaging, Device confirmed in needle, then inserted full length of needle and withdrawn per handbook instructions. Nexplanon was able to palpated in the patient's arm; patient palpated the insert herself.  There was minimal blood loss. Patient insertion site covered with gauze and a pressure bandage to reduce any bruising. The patient tolerated the procedure well and was given post procedure instructions.  She was advised to have backup contraception for one week.      Verita Schneiders, MD, [place] [facility] [employer], [place]

## 2019-08-25 NOTE — Addendum Note (Signed)
Addended by: Bethanne Ginger on: 08/25/2019 05:27 PM   Modules accepted: Orders

## 2019-08-26 ENCOUNTER — Other Ambulatory Visit: Payer: Self-pay

## 2019-08-30 ENCOUNTER — Ambulatory Visit (HOSPITAL_COMMUNITY): Payer: Medicare HMO

## 2019-08-30 ENCOUNTER — Telehealth (HOSPITAL_COMMUNITY): Payer: Self-pay | Admitting: Family Medicine

## 2019-09-05 ENCOUNTER — Ambulatory Visit (HOSPITAL_COMMUNITY): Payer: Medicare HMO

## 2019-09-05 ENCOUNTER — Ambulatory Visit (HOSPITAL_COMMUNITY): Payer: Self-pay | Admitting: *Deleted

## 2019-09-05 NOTE — Progress Notes (Signed)
This was the 2nd no show by patient for walk test/ orientation to pulmonary rehab.  Patient did not call to cancel.  Her referral is being closed.

## 2019-09-08 ENCOUNTER — Telehealth: Payer: Self-pay | Admitting: Internal Medicine

## 2019-09-08 MED ORDER — PREDNISONE 10 MG PO TABS: 10.0000 mg | ORAL_TABLET | Freq: Every day | ORAL | 5 refills | Status: DC

## 2019-09-08 NOTE — Telephone Encounter (Signed)
Call returned to patient, requesting refill of prednisone.   CY please advise if okay to refill prednisone. Thanks.

## 2019-09-08 NOTE — Telephone Encounter (Signed)
Refill for prednisone sent to preferred pharmacy. Called and spoke with pt letting her know this had been done and pt verbalized understanding. Nothing further needed.

## 2019-09-08 NOTE — Telephone Encounter (Signed)
Ok to refill as before 

## 2019-09-13 IMAGING — DX DG CHEST 2V
2 series · 2 of 2 positions shown · non-contrast
Comparison: 07/15/2017 and prior radiographs.  01/05/2017 CT

CLINICAL DATA: Cough and acute upper respiratory infection

EXAM:
CHEST  2 VIEW

[w chest pa]
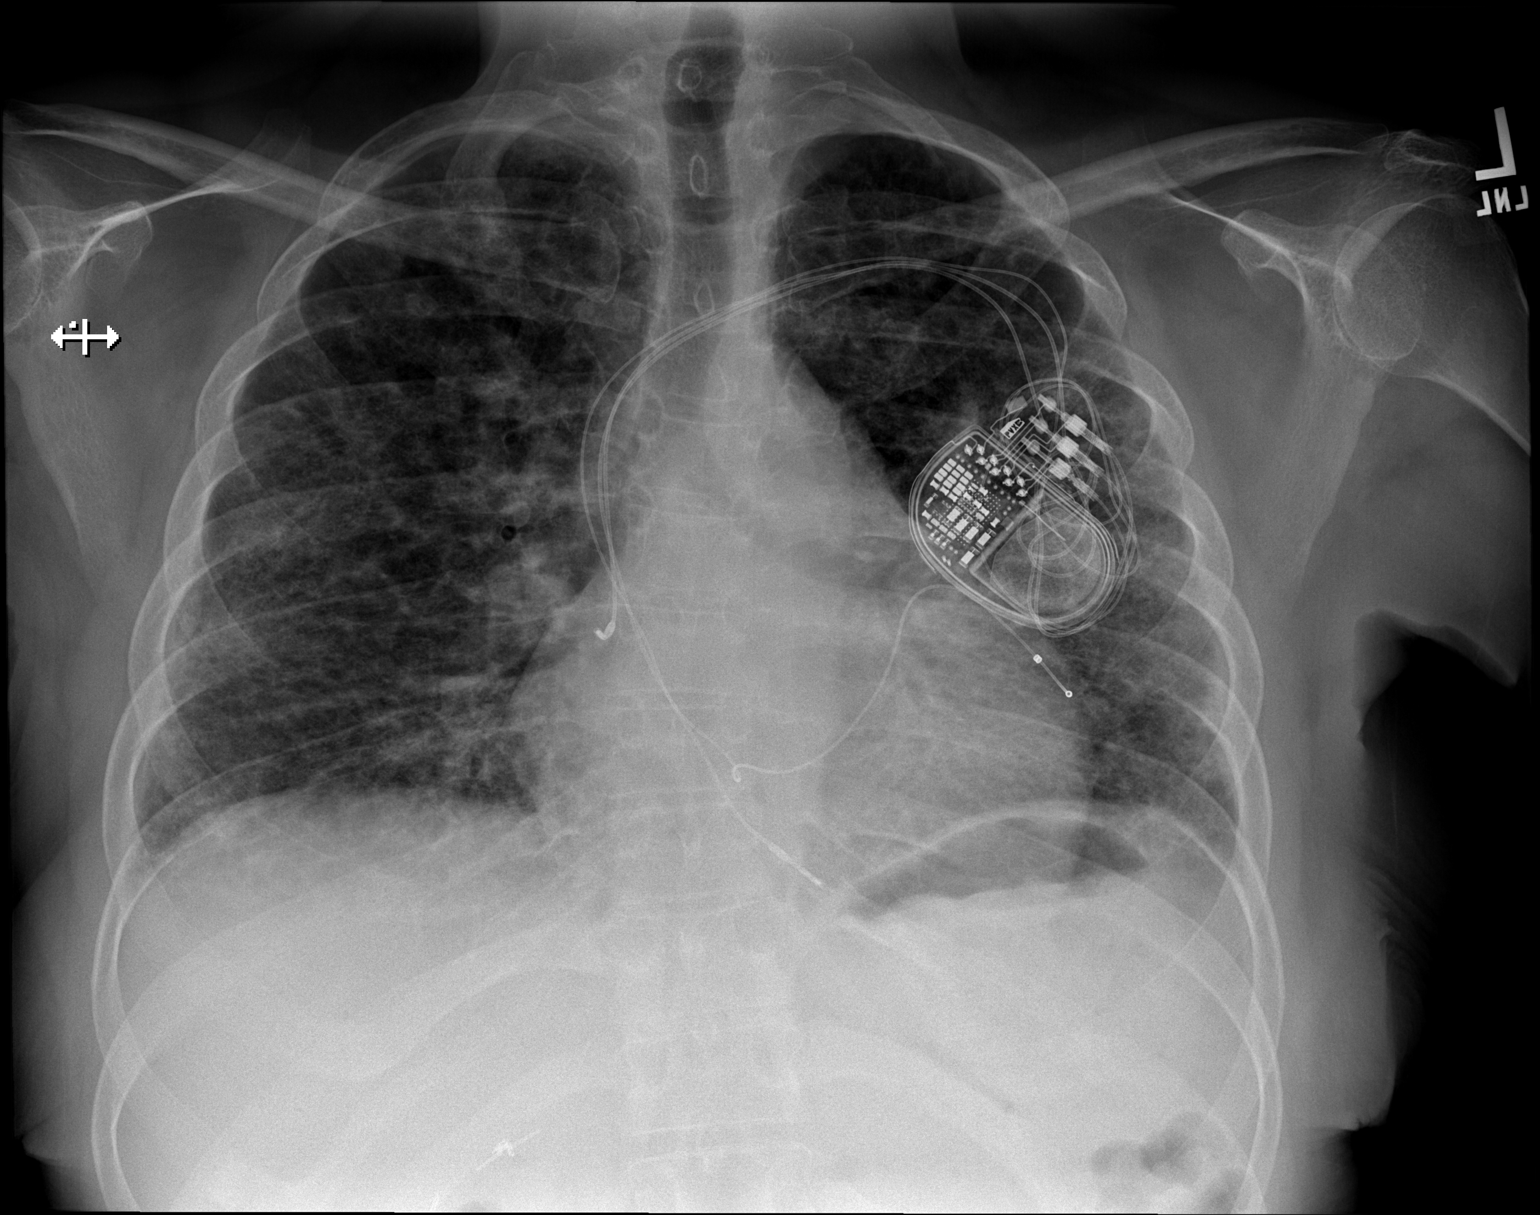

[w chest lat]
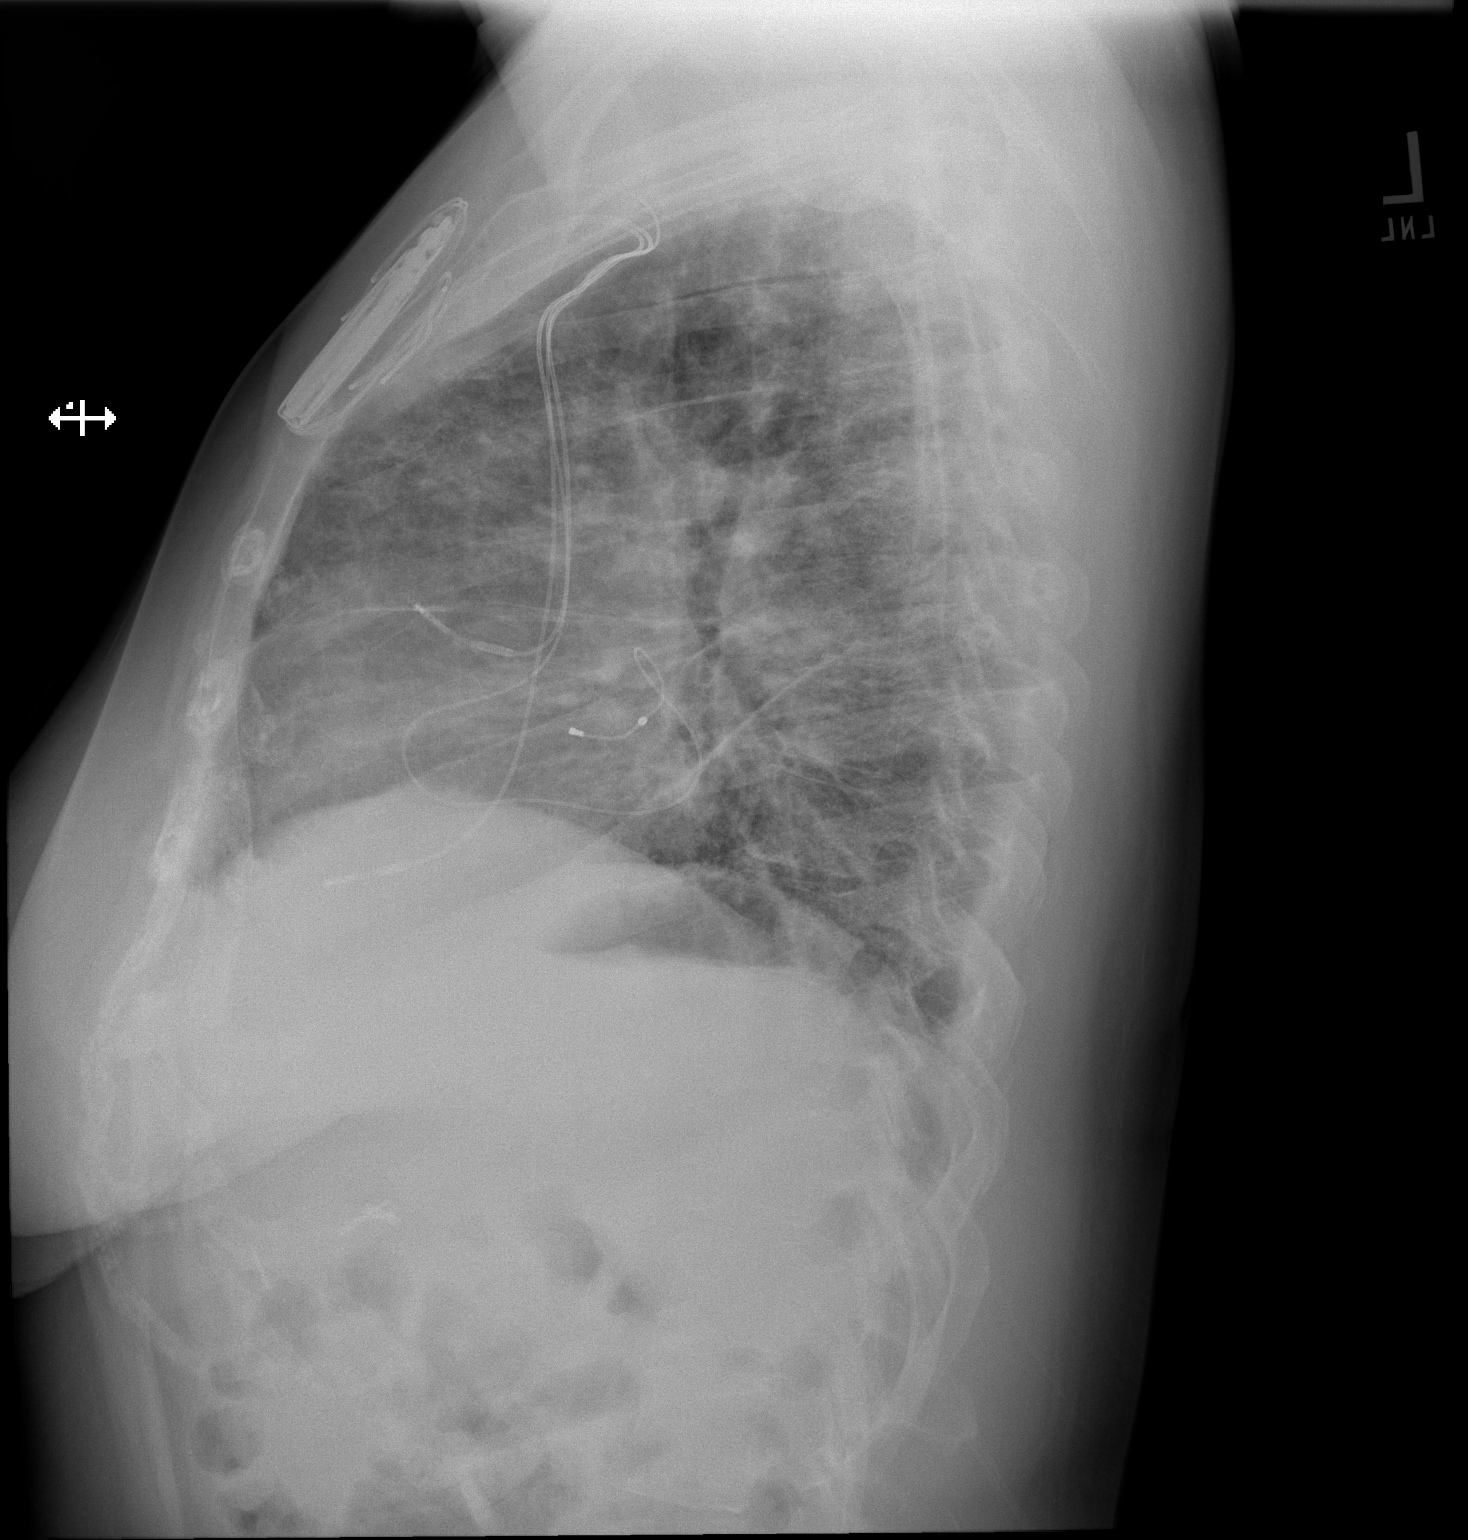

[2 of 2 positions shown; findings below may reference images not displayed]

FINDINGS: Cardiomegaly and left-sided pacemaker again noted.

Bilateral interstitial opacities are unchanged.

There is no evidence of focal airspace disease, pulmonary edema,
suspicious pulmonary nodule/mass, pleural effusion, or pneumothorax.
No acute bony abnormalities are identified.
IMPRESSION: 1. No evidence of acute intracranial abnormality
2. Chronic interstitial lung disease
3. Cardiomegaly

## 2019-09-14 NOTE — Progress Notes (Signed)
Remote pacemaker transmission.   

## 2019-09-20 ENCOUNTER — Encounter: Payer: Self-pay | Admitting: *Deleted

## 2019-09-21 ENCOUNTER — Telehealth: Payer: Self-pay | Admitting: Internal Medicine

## 2019-09-21 NOTE — Telephone Encounter (Signed)
Patient is calling wanting results from pace maker. Please advise.

## 2019-09-21 NOTE — Telephone Encounter (Signed)
Spoke with patient. Advised she is scheduled for next monthly battery check on 09/22/19. Pt requested results from unscheduled 09/19/19 transmission, specifically her OptiVol status. Advised that as of 11/30, her thoracic impedance has been back at baseline since 09/12/19. Remaining battery longevity estimated at 3 months. Pt verbalizes understanding, agrees to send tomorrow as scheduled.

## 2019-09-22 ENCOUNTER — Ambulatory Visit (INDEPENDENT_AMBULATORY_CARE_PROVIDER_SITE_OTHER): Payer: Medicare HMO | Admitting: *Deleted

## 2019-09-22 DIAGNOSIS — I442 Atrioventricular block, complete: Secondary | ICD-10-CM

## 2019-09-22 LAB — CUP PACEART REMOTE DEVICE CHECK
Battery Remaining Longevity: 4 mo
Battery Voltage: 2.82 V
Brady Statistic AP VP Percent: 0.05 %
Brady Statistic AP VS Percent: 0.01 %
Brady Statistic AS VP Percent: 99.67 %
Brady Statistic AS VS Percent: 0.27 %
Brady Statistic RA Percent Paced: 0.06 %
Brady Statistic RV Percent Paced: 99.47 %
Date Time Interrogation Session: 20201203083056
Implantable Lead Implant Date: 19971017
Implantable Lead Implant Date: 19971017
Implantable Lead Implant Date: 20130520
Implantable Lead Location: 753858
Implantable Lead Location: 753859
Implantable Lead Location: 753860
Implantable Lead Model: 4196
Implantable Pulse Generator Implant Date: 20130520
Lead Channel Impedance Value: 228 Ohm
Lead Channel Impedance Value: 247 Ohm
Lead Channel Impedance Value: 266 Ohm
Lead Channel Impedance Value: 285 Ohm
Lead Channel Impedance Value: 418 Ohm
Lead Channel Impedance Value: 437 Ohm
Lead Channel Impedance Value: 456 Ohm
Lead Channel Impedance Value: 494 Ohm
Lead Channel Impedance Value: 722 Ohm
Lead Channel Pacing Threshold Amplitude: 0.625 V
Lead Channel Pacing Threshold Amplitude: 1 V
Lead Channel Pacing Threshold Amplitude: 1.5 V
Lead Channel Pacing Threshold Pulse Width: 0.4 ms
Lead Channel Pacing Threshold Pulse Width: 0.4 ms
Lead Channel Pacing Threshold Pulse Width: 0.4 ms
Lead Channel Sensing Intrinsic Amplitude: 11.375 mV
Lead Channel Sensing Intrinsic Amplitude: 11.375 mV
Lead Channel Sensing Intrinsic Amplitude: 4.625 mV
Lead Channel Sensing Intrinsic Amplitude: 4.625 mV
Lead Channel Setting Pacing Amplitude: 2 V
Lead Channel Setting Pacing Amplitude: 2 V
Lead Channel Setting Pacing Amplitude: 2.25 V
Lead Channel Setting Pacing Pulse Width: 0.4 ms
Lead Channel Setting Pacing Pulse Width: 0.4 ms
Lead Channel Setting Sensing Sensitivity: 0.9 mV

## 2019-10-03 ENCOUNTER — Ambulatory Visit: Payer: Medicare HMO | Admitting: Internal Medicine

## 2019-10-03 ENCOUNTER — Encounter: Payer: Self-pay | Admitting: Family Medicine

## 2019-10-03 ENCOUNTER — Other Ambulatory Visit: Payer: Self-pay

## 2019-10-03 ENCOUNTER — Ambulatory Visit (INDEPENDENT_AMBULATORY_CARE_PROVIDER_SITE_OTHER): Payer: Medicare HMO | Admitting: Family Medicine

## 2019-10-03 VITALS — BP 112/68 | HR 97 | Wt 142.0 lb

## 2019-10-03 DIAGNOSIS — E669 Obesity, unspecified: Secondary | ICD-10-CM | POA: Diagnosis not present

## 2019-10-03 DIAGNOSIS — E1169 Type 2 diabetes mellitus with other specified complication: Secondary | ICD-10-CM | POA: Diagnosis not present

## 2019-10-03 DIAGNOSIS — R0602 Shortness of breath: Secondary | ICD-10-CM | POA: Diagnosis not present

## 2019-10-03 LAB — POCT GLYCOSYLATED HEMOGLOBIN (HGB A1C): HbA1c, POC (controlled diabetic range): 7 % (ref 0.0–7.0)

## 2019-10-03 NOTE — Patient Instructions (Signed)
It was great to see you today! Thank you for letting me participate in your care!  Today, we discussed your shortness of breath and I am getting some labs and a chest x-ray. If you have an increased and sustained oxygen requirement please go to the Emergency Department.  Be well, Makayla Rutherford, DO PGY-3, [facility]

## 2019-10-03 NOTE — Progress Notes (Signed)
Subjective: Chief Complaint  Patient presents with  . Follow-up     HPI: Makayla Vasquez is a 38 y.o. presenting to clinic today to discuss the following:  Shortness of Breath Makayla Vasquez is a 38y/o female with PMH of interstitial lung disease, CHF, and complete AV block who is well known to me. She presents to clinic today for increased shortness of breath, increased oxygen requirement, and reporting "something just does not feel right". She states over the past 3-4 days at times she has gotten more out of breath than usual as her activity level has not changed. She is usually between 2-4L of home O2 but lately at times has needed 5L. Mostly however she is comfortable at 2L and is on 3L and breathing comfortably in our appointment. She has a productive cough with thick whitish phelgm and discharge, no blood. Overall, she has felt fatigued and had chills recently but no fever, nausea, abdominal pain, or rash. No known COVID exposure.     ROS noted in HPI.    Social History   Tobacco Use  Smoking Status Former Smoker  . Packs/day: 0.25  . Years: 0.50  . Pack years: 0.12  . Types: Cigarettes  . Quit date: 07/25/1996  . Years since quitting: 23.2  Smokeless Tobacco Never Used   Objective: BP 112/68   Pulse 97   Wt 142 lb (64.4 kg)   SpO2 98%   BMI 23.63 kg/m  Vitals and nursing notes reviewed  Physical Exam Gen: Alert and Oriented x 3, NAD CV: RRR, no murmurs, normal S1, S2 split Resp: poor air movement diffusely, no wheezing, no rhonchi, no crackles Ext: no clubbing, cyanosis, or edema, intact peripheral pulses Skin: warm, dry, intact, no rashes  Results for orders placed or performed in visit on 10/03/19 (from the past 72 hour(s))  HgB A1c     Status: None   Collection Time: 10/03/19  4:13 PM  Result Value Ref Range   Hemoglobin A1C     HbA1c POC (<> result, manual entry)     HbA1c, POC (prediabetic range)     HbA1c, POC (controlled diabetic range) 7.0 0.0 -  7.0 %  CBC with Differential     Status: Abnormal   Collection Time: 10/03/19  4:54 PM  Result Value Ref Range   WBC 10.6 3.4 - 10.8 x10E3/uL   RBC 4.83 3.77 - 5.28 x10E6/uL   Hemoglobin 9.6 (L) 11.1 - 15.9 g/dL   Hematocrit 77.8 (L) 24.2 - 46.6 %   MCV 70 (L) 79 - 97 fL   MCH 19.9 (L) 26.6 - 33.0 pg   MCHC 28.6 (L) 31.5 - 35.7 g/dL   RDW 35.3 (H) 61.4 - 43.1 %   Platelets 459 (H) 150 - 450 x10E3/uL   Neutrophils 84 Not Estab. %   Lymphs 10 Not Estab. %   Monocytes 3 Not Estab. %   Eos 1 Not Estab. %   Basos 1 Not Estab. %   Neutrophils Absolute 9.0 (H) 1.4 - 7.0 x10E3/uL   Lymphocytes Absolute 1.1 0.7 - 3.1 x10E3/uL   Monocytes Absolute 0.3 0.1 - 0.9 x10E3/uL   EOS (ABSOLUTE) 0.1 0.0 - 0.4 x10E3/uL   Basophils Absolute 0.1 0.0 - 0.2 x10E3/uL   Immature Granulocytes 1 Not Estab. %   Immature Grans (Abs) 0.1 0.0 - 0.1 x10E3/uL  Basic Metabolic Panel     Status: Abnormal   Collection Time: 10/03/19  4:54 PM  Result Value Ref  Range   Glucose 152 (H) 65 - 99 mg/dL   BUN 12 6 - 20 mg/dL   Creatinine, Ser 0.79 0.57 - 1.00 mg/dL   GFR calc non Af Amer 95 >59 mL/min/1.73   GFR calc Af Amer 109 >59 mL/min/1.73   BUN/Creatinine Ratio 15 9 - 23   Sodium 143 134 - 144 mmol/L   Potassium 5.2 3.5 - 5.2 mmol/L   Chloride 100 96 - 106 mmol/L   CO2 27 20 - 29 mmol/L   Calcium 9.7 8.7 - 10.2 mg/dL  Brain natriuretic peptide     Status: None (Preliminary result)   Collection Time: 10/03/19  4:54 PM  Result Value Ref Range   BNP WILL FOLLOW    *Note: Due to a large number of results and/or encounters for the requested time period, some results have not been displayed. A complete set of results can be found in Results Review.    Assessment/Plan:  Shortness of breath Given her advanced interstitial lung disease and history I am concerned about multiple causes of her new onset SOB. Differential remains broad including worsening CHF (acute on chronic exacerbation), progressive worsening  interstitial lung disease, CAP, vs viral PNA.  Given her stable vitals and overall well appearance I do not think she requires hospital admission at this time as I am hoping to treat her outpatient. Want to avoid COVID exposure for her as much as possible. - Chest X-ray 2 view - BNP to check for acute exacerbation of CHF - CBC and BMP to help determine if there is an active infection - Will call patient once x-ray is in and plan for close follow up  PATIENT EDUCATION PROVIDED: See AVS    Diagnosis and plan along with any newly prescribed medication(s) were discussed in detail with this patient today. The patient verbalized understanding and agreed with the plan. Patient advised if symptoms worsen return to clinic or ER.    Orders Placed This Encounter  Procedures  . DG Chest 2 View    Standing Status:   Future    Standing Expiration Date:   12/03/2020    Order Specific Question:   Reason for Exam (SYMPTOM  OR DIAGNOSIS REQUIRED)    Answer:   short of breath    Order Specific Question:   Is patient pregnant?    Answer:   No    Order Specific Question:   Preferred imaging location?    Answer:   [facility]    Order Specific Question:   Radiology Contrast Protocol - do NOT remove file path    Answer:   [email or web address]  . CBC with Differential  . Basic Metabolic Panel  . Brain natriuretic peptide  . HgB A1c     Harolyn Rutherford, DO 10/03/2019, 4:37 PM PGY-3 [place]

## 2019-10-04 ENCOUNTER — Ambulatory Visit
Admission: RE | Admit: 2019-10-04 | Discharge: 2019-10-04 | Disposition: A | Discharge status: Home / Self Care | Payer: Medicare HMO | Source: Ambulatory Visit | Attending: Family Medicine | Admitting: Family Medicine

## 2019-10-04 DIAGNOSIS — R0602 Shortness of breath: Secondary | ICD-10-CM

## 2019-10-04 LAB — BRAIN NATRIURETIC PEPTIDE: BNP: 50.7 pg/mL (ref 0.0–100.0)

## 2019-10-04 LAB — CBC WITH DIFFERENTIAL/PLATELET
Basophils Absolute: 0.1 10*3/uL (ref 0.0–0.2)
Basos: 1 %
EOS (ABSOLUTE): 0.1 10*3/uL (ref 0.0–0.4)
Eos: 1 %
Hematocrit: 33.6 % — ABNORMAL LOW (ref 34.0–46.6)
Hemoglobin: 9.6 g/dL — ABNORMAL LOW (ref 11.1–15.9)
Immature Grans (Abs): 0.1 10*3/uL (ref 0.0–0.1)
Immature Granulocytes: 1 %
Lymphocytes Absolute: 1.1 10*3/uL (ref 0.7–3.1)
Lymphs: 10 %
MCH: 19.9 pg — ABNORMAL LOW (ref 26.6–33.0)
MCHC: 28.6 g/dL — ABNORMAL LOW (ref 31.5–35.7)
MCV: 70 fL — ABNORMAL LOW (ref 79–97)
Monocytes Absolute: 0.3 10*3/uL (ref 0.1–0.9)
Monocytes: 3 %
Neutrophils Absolute: 9 10*3/uL — ABNORMAL HIGH (ref 1.4–7.0)
Neutrophils: 84 %
Platelets: 459 10*3/uL — ABNORMAL HIGH (ref 150–450)
RBC: 4.83 x10E6/uL (ref 3.77–5.28)
RDW: 18.5 % — ABNORMAL HIGH (ref 11.7–15.4)
WBC: 10.6 10*3/uL (ref 3.4–10.8)

## 2019-10-04 LAB — BASIC METABOLIC PANEL
BUN/Creatinine Ratio: 15 (ref 9–23)
BUN: 12 mg/dL (ref 6–20)
CO2: 27 mmol/L (ref 20–29)
Calcium: 9.7 mg/dL (ref 8.7–10.2)
Chloride: 100 mmol/L (ref 96–106)
Creatinine, Ser: 0.79 mg/dL (ref 0.57–1.00)
GFR calc Af Amer: 109 mL/min/{1.73_m2} (ref >59)
GFR calc non Af Amer: 95 mL/min/{1.73_m2} (ref >59)
Glucose: 152 mg/dL — ABNORMAL HIGH (ref 65–99)
Potassium: 5.2 mmol/L (ref 3.5–5.2)
Sodium: 143 mmol/L (ref 134–144)

## 2019-10-04 NOTE — Assessment & Plan Note (Addendum)
Given her advanced interstitial lung disease and history I am concerned about multiple causes of her new onset SOB. Differential remains broad including worsening CHF (acute on chronic exacerbation), progressive worsening interstitial lung disease, CAP, vs viral PNA.  Given her stable vitals and overall well appearance I do not think she requires hospital admission at this time as I am hoping to treat her outpatient. Want to avoid COVID exposure for her as much as possible. - Chest X-ray 2 view - BNP to check for acute exacerbation of CHF - CBC and BMP to help determine if there is an active infection - Will call patient once x-ray is in and plan for close follow up

## 2019-10-05 ENCOUNTER — Encounter: Payer: Self-pay | Admitting: Family Medicine

## 2019-10-06 ENCOUNTER — Telehealth: Payer: Self-pay

## 2019-10-06 NOTE — Telephone Encounter (Signed)
Called and informed patient of negative results.  Makayla Vasquez, [place]

## 2019-10-15 ENCOUNTER — Other Ambulatory Visit (HOSPITAL_COMMUNITY): Payer: Self-pay | Admitting: Cardiology

## 2019-10-17 ENCOUNTER — Other Ambulatory Visit: Payer: Self-pay

## 2019-10-17 ENCOUNTER — Ambulatory Visit (INDEPENDENT_AMBULATORY_CARE_PROVIDER_SITE_OTHER): Payer: Medicare HMO | Admitting: Family Medicine

## 2019-10-17 ENCOUNTER — Encounter: Payer: Self-pay | Admitting: Family Medicine

## 2019-10-17 DIAGNOSIS — R093 Abnormal sputum: Secondary | ICD-10-CM | POA: Insufficient documentation

## 2019-10-17 DIAGNOSIS — R112 Nausea with vomiting, unspecified: Secondary | ICD-10-CM

## 2019-10-17 MED ORDER — BENZONATATE 100 MG PO CAPS: 100.0000 mg | ORAL_CAPSULE | Freq: Three times a day (TID) | ORAL | 0 refills | Status: AC | PRN

## 2019-10-17 NOTE — Assessment & Plan Note (Signed)
Emesis persisting for >1 month. Patient able to drink fluids without emesis. Denies hematemesis.  -BMP to check electrolyte levels

## 2019-10-17 NOTE — Assessment & Plan Note (Addendum)
Patient reports increased sputum production, denies hemoptysis. Recent CXR with no signs of new infiltrates, denies sick contacts, no fever, + chills.  Temp 98.6 in office today, Patient using 4 liters oxygen with 99% O2 sats, Walk test with oxygen sat 97%.  -CBC to check for leukocytosis  -ambulation with pulse ox, saturations decreased to 97%  -recommend follow up with pulmonologist  -Prescribed Tessalon Perles

## 2019-10-17 NOTE — Progress Notes (Signed)
  Patient Name: Makayla Vasquez Date of Birth: 11-20-1980 Date of Visit: 10/17/19 PCP: Nuala Alpha, DO  Chief Complaint: f/u for increased SOB  Subjective: Makayla Vasquez is a 38 y.o. with medical history significant for CHF, cor pulmonale, lupus, T2DM, ILD, GERD, and  HLD presenting today for follow up for increased sputum production and increasing SOB.   Makayla Vasquez states she has continued to have increased sputum production since her previous visit with Dr. Garlan Fillers.  Patient states that she does not recall having hemoptysis and has not noticed any change in the appearance of her sputum.  She reports that she normally uses 2 L of home oxygen, but has 2 increased to 5 L frequently.  Patient also states that she received a call from her rheumatologist after reviewing last visit lab work and there was some concerned of need for repeat lab work concerning patient CellCept medication.  I reassured patient that her previous lab work did not appear to have signs of an infection, however we could repeat these given that her symptoms have not improved since the last visit.  Patient expressed understanding.  She denies presence of fever but does endorse chills and emesis whenever she eats solid foods.    I have reviewed the patient's medical, surgical, family, and social history as appropriate.  Vitals:   10/17/19 0854  BP: 104/68  Pulse: (!) 115  SpO2: 99%    Physical Exam:   General: Alert and cooperative and appears to be in no acute distress HEENT: Neck non-tender without lymphadenopathy, no supraclavicular retractions  Cardio: Normal S1 and S2, no S3 or S4. Rhythm is regular. Hyperdynamic s1, no friction rub, patient with known pacemaker  Pulm: Clear to auscultation bilaterally, no crackles, wheezing, diminished breath sounds bilaterally, restricted airway movements, no subcostal retractions, patient on 4 liters [place] with home oxygen supply. Normal respiratory effort Abdomen: Bowel sounds  normal. Abdomen soft and non-tender.  Extremities: No peripheral edema. Warm/ well perfused.   Neuro: alert and oriented x4   Assessment & Plan:   Emesis Emesis persisting for >1 month. Patient able to drink fluids without emesis. Denies hematemesis.  -BMP to check electrolyte levels   Increased sputum production Patient reports increased sputum production, denies hemoptysis. Recent CXR with no signs of new infiltrates, denies sick contacts, no fever, + chills.  Temp 98.6 in office today, Patient using 4 liters oxygen with 99% O2 sats, Walk test with oxygen sat 97%.  -CBC to check for leukocytosis  -ambulation with pulse ox, saturations decreased to 97%  -recommend follow up with pulmonologist  -Prescribed Tessalon Perles  Return to care in 3 weeks.   Stark Klein, MD  Family Medicine  PGY1

## 2019-10-17 NOTE — Patient Instructions (Addendum)
Thank you for allowing [place] to take part in your care today! You were seen in order to follow up on increased SOB.   Our plan for today is:   -Prescribed a medicine to help with your cough. -We will be checking your blood work for electrolyte changes due to your vomiting and for any signs of infection. -I will notify you of any abnormal results -If you continue to have worsening shortness of breath despite the use of supplemental oxygen, please go to urgent care or seek further evaluation in the emergency department. -I also recommend following up with your pulmonologist as soon as possible.   Cough, Adult Coughing is a reflex that clears your throat and your airways (respiratory system). Coughing helps to heal and protect your lungs. It is normal to cough occasionally, but a cough that happens with other symptoms or lasts a long time may be a sign of a condition that needs treatment. An acute cough may only last 2-3 weeks, while a chronic cough may last 8 or more weeks. Coughing is commonly caused by:  Infection of the respiratory systemby viruses or bacteria.  Breathing in substances that irritate your lungs.  Allergies.  Asthma.  Mucus that runs down the back of your throat (postnasal drip).  Smoking.  Acid backing up from the stomach into the esophagus (gastroesophageal reflux).  Certain medicines.  Chronic lung problems.  Other medical conditions such as heart failure or a blood clot in the lung (pulmonary embolism). Follow these instructions at home: Medicines  Take over-the-counter and prescription medicines only as told by your health care provider.  Talk with your health care provider before you take a cough suppressant medicine. Lifestyle   Avoid cigarette smoke. Do not use any products that contain nicotine or tobacco, such as cigarettes, e-cigarettes, and chewing tobacco. If you need help quitting, ask your health care provider.  Drink enough fluid to keep your  urine pale yellow.  Avoid caffeine.  Do not drink alcohol if your health care provider tells you not to drink. General instructions   Pay close attention to changes in your cough. Tell your health care provider about them.  Always cover your mouth when you cough.  Avoid things that make you cough, such as perfume, candles, cleaning products, or campfire or tobacco smoke.  If the air is dry, use a cool mist vaporizer or humidifier in your bedroom or your home to help loosen secretions.  If your cough is worse at night, try to sleep in a semi-upright position.  Rest as needed.  Keep all follow-up visits as told by your health care provider. This is important. Contact a health care provider if you:  Have new symptoms.  Cough up pus.  Have a cough that does not get better after 2-3 weeks or gets worse.  Cannot control your cough with cough suppressant medicines and you are losing sleep.  Have pain that gets worse or pain that is not helped with medicine.  Have a fever.  Have unexplained weight loss.  Have night sweats. Get help right away if:  You cough up blood.  You have difficulty breathing.  Your heartbeat is very fast. These symptoms may represent a serious problem that is an emergency. Do not wait to see if the symptoms will go away. Get medical help right away. Call your local emergency services (911 in the [place] Do not drive yourself to the hospital. Summary  Coughing is a reflex that clears your throat and  your airways. It is normal to cough occasionally, but a cough that happens with other symptoms or lasts a long time may be a sign of a condition that needs treatment.  Take over-the-counter and prescription medicines only as told by your health care provider.  Always cover your mouth when you cough.  Contact a health care provider if you have new symptoms or a cough that does not get better after 2-3 weeks or gets worse. This information is not intended  to replace advice given to you by your health care provider. Make sure you discuss any questions you have with your health care provider. Document Released: 04/04/2011 Document Revised: 10/25/2018 Document Reviewed: 10/25/2018 Elsevier Patient Education  2020 [employer].

## 2019-10-18 LAB — BASIC METABOLIC PANEL
BUN/Creatinine Ratio: 13 (ref 9–23)
BUN: 11 mg/dL (ref 6–20)
CO2: 23 mmol/L (ref 20–29)
Calcium: 9.1 mg/dL (ref 8.7–10.2)
Chloride: 102 mmol/L (ref 96–106)
Creatinine, Ser: 0.83 mg/dL (ref 0.57–1.00)
GFR calc Af Amer: 103 mL/min/{1.73_m2} (ref >59)
GFR calc non Af Amer: 89 mL/min/{1.73_m2} (ref >59)
Glucose: 138 mg/dL — ABNORMAL HIGH (ref 65–99)
Potassium: 4.5 mmol/L (ref 3.5–5.2)
Sodium: 142 mmol/L (ref 134–144)

## 2019-10-18 LAB — CBC
Hematocrit: 30.6 % — ABNORMAL LOW (ref 34.0–46.6)
Hemoglobin: 8.7 g/dL — ABNORMAL LOW (ref 11.1–15.9)
MCH: 19.9 pg — ABNORMAL LOW (ref 26.6–33.0)
MCHC: 28.4 g/dL — ABNORMAL LOW (ref 31.5–35.7)
MCV: 70 fL — ABNORMAL LOW (ref 79–97)
Platelets: 398 10*3/uL (ref 150–450)
RBC: 4.38 x10E6/uL (ref 3.77–5.28)
RDW: 18.1 % — ABNORMAL HIGH (ref 11.7–15.4)
WBC: 5.7 10*3/uL (ref 3.4–10.8)

## 2019-10-18 NOTE — Progress Notes (Signed)
Monthly battery check  

## 2019-10-19 ENCOUNTER — Encounter: Payer: Self-pay | Admitting: Family Medicine

## 2019-10-23 ENCOUNTER — Other Ambulatory Visit: Payer: Self-pay

## 2019-10-23 ENCOUNTER — Encounter (HOSPITAL_COMMUNITY): Payer: Self-pay | Admitting: Emergency Medicine

## 2019-10-23 ENCOUNTER — Inpatient Hospital Stay (HOSPITAL_COMMUNITY)
Admission: EM | Admit: 2019-10-23 | Discharge: 2019-11-02 | DRG: 177 | Disposition: A | Discharge status: Home / Self Care | Payer: Medicare HMO | Attending: Family Medicine | Admitting: Family Medicine

## 2019-10-23 ENCOUNTER — Emergency Department (HOSPITAL_COMMUNITY): Payer: Medicare HMO

## 2019-10-23 DIAGNOSIS — Z950 Presence of cardiac pacemaker: Secondary | ICD-10-CM

## 2019-10-23 DIAGNOSIS — I110 Hypertensive heart disease with heart failure: Secondary | ICD-10-CM | POA: Diagnosis present

## 2019-10-23 DIAGNOSIS — U071 COVID-19: Principal | ICD-10-CM

## 2019-10-23 DIAGNOSIS — I479 Paroxysmal tachycardia, unspecified: Secondary | ICD-10-CM

## 2019-10-23 DIAGNOSIS — I5023 Acute on chronic systolic (congestive) heart failure: Secondary | ICD-10-CM | POA: Diagnosis present

## 2019-10-23 DIAGNOSIS — K219 Gastro-esophageal reflux disease without esophagitis: Secondary | ICD-10-CM | POA: Diagnosis present

## 2019-10-23 DIAGNOSIS — N179 Acute kidney failure, unspecified: Secondary | ICD-10-CM | POA: Diagnosis present

## 2019-10-23 DIAGNOSIS — Z87891 Personal history of nicotine dependence: Secondary | ICD-10-CM

## 2019-10-23 DIAGNOSIS — F3170 Bipolar disorder, currently in remission, most recent episode unspecified: Secondary | ICD-10-CM | POA: Diagnosis present

## 2019-10-23 DIAGNOSIS — J449 Chronic obstructive pulmonary disease, unspecified: Secondary | ICD-10-CM | POA: Diagnosis present

## 2019-10-23 DIAGNOSIS — E669 Obesity, unspecified: Secondary | ICD-10-CM | POA: Diagnosis present

## 2019-10-23 DIAGNOSIS — Z79899 Other long term (current) drug therapy: Secondary | ICD-10-CM

## 2019-10-23 DIAGNOSIS — J1282 Pneumonia due to coronavirus disease 2019: Secondary | ICD-10-CM | POA: Diagnosis present

## 2019-10-23 DIAGNOSIS — I2729 Other secondary pulmonary hypertension: Secondary | ICD-10-CM | POA: Diagnosis present

## 2019-10-23 DIAGNOSIS — J9611 Chronic respiratory failure with hypoxia: Secondary | ICD-10-CM

## 2019-10-23 DIAGNOSIS — D8989 Other specified disorders involving the immune mechanism, not elsewhere classified: Secondary | ICD-10-CM

## 2019-10-23 DIAGNOSIS — IMO0002 Reserved for concepts with insufficient information to code with codable children: Secondary | ICD-10-CM | POA: Diagnosis present

## 2019-10-23 DIAGNOSIS — R0602 Shortness of breath: Secondary | ICD-10-CM

## 2019-10-23 DIAGNOSIS — Z8249 Family history of ischemic heart disease and other diseases of the circulatory system: Secondary | ICD-10-CM

## 2019-10-23 DIAGNOSIS — Q246 Congenital heart block: Secondary | ICD-10-CM

## 2019-10-23 DIAGNOSIS — Z7952 Long term (current) use of systemic steroids: Secondary | ICD-10-CM

## 2019-10-23 DIAGNOSIS — E876 Hypokalemia: Secondary | ICD-10-CM | POA: Diagnosis present

## 2019-10-23 DIAGNOSIS — M329 Systemic lupus erythematosus, unspecified: Secondary | ICD-10-CM | POA: Diagnosis present

## 2019-10-23 DIAGNOSIS — B9561 Methicillin susceptible Staphylococcus aureus infection as the cause of diseases classified elsewhere: Secondary | ICD-10-CM | POA: Diagnosis present

## 2019-10-23 DIAGNOSIS — J9621 Acute and chronic respiratory failure with hypoxia: Secondary | ICD-10-CM | POA: Diagnosis present

## 2019-10-23 DIAGNOSIS — R7881 Bacteremia: Secondary | ICD-10-CM | POA: Diagnosis present

## 2019-10-23 DIAGNOSIS — I420 Dilated cardiomyopathy: Secondary | ICD-10-CM | POA: Diagnosis present

## 2019-10-23 DIAGNOSIS — M069 Rheumatoid arthritis, unspecified: Secondary | ICD-10-CM | POA: Diagnosis present

## 2019-10-23 DIAGNOSIS — J440 Chronic obstructive pulmonary disease with acute lower respiratory infection: Secondary | ICD-10-CM | POA: Diagnosis present

## 2019-10-23 DIAGNOSIS — A4101 Sepsis due to Methicillin susceptible Staphylococcus aureus: Secondary | ICD-10-CM | POA: Diagnosis not present

## 2019-10-23 DIAGNOSIS — E2740 Unspecified adrenocortical insufficiency: Secondary | ICD-10-CM

## 2019-10-23 DIAGNOSIS — Z7984 Long term (current) use of oral hypoglycemic drugs: Secondary | ICD-10-CM

## 2019-10-23 DIAGNOSIS — E785 Hyperlipidemia, unspecified: Secondary | ICD-10-CM | POA: Diagnosis present

## 2019-10-23 DIAGNOSIS — M609 Myositis, unspecified: Secondary | ICD-10-CM | POA: Diagnosis present

## 2019-10-23 DIAGNOSIS — Z832 Family history of diseases of the blood and blood-forming organs and certain disorders involving the immune mechanism: Secondary | ICD-10-CM

## 2019-10-23 DIAGNOSIS — E871 Hypo-osmolality and hyponatremia: Secondary | ICD-10-CM | POA: Diagnosis present

## 2019-10-23 DIAGNOSIS — F3175 Bipolar disorder, in partial remission, most recent episode depressed: Secondary | ICD-10-CM

## 2019-10-23 DIAGNOSIS — E1169 Type 2 diabetes mellitus with other specified complication: Secondary | ICD-10-CM | POA: Diagnosis present

## 2019-10-23 LAB — CBC WITH DIFFERENTIAL/PLATELET
Abs Immature Granulocytes: 0.03 10*3/uL (ref 0.00–0.07)
Basophils Absolute: 0.1 10*3/uL (ref 0.0–0.1)
Basophils Relative: 1 %
Eosinophils Absolute: 0.3 10*3/uL (ref 0.0–0.5)
Eosinophils Relative: 4 %
HCT: 35.9 % — ABNORMAL LOW (ref 36.0–46.0)
Hemoglobin: 9.9 g/dL — ABNORMAL LOW (ref 12.0–15.0)
Immature Granulocytes: 0 %
Lymphocytes Relative: 17 %
Lymphs Abs: 1.3 10*3/uL (ref 0.7–4.0)
MCH: 20.4 pg — ABNORMAL LOW (ref 26.0–34.0)
MCHC: 27.6 g/dL — ABNORMAL LOW (ref 30.0–36.0)
MCV: 74 fL — ABNORMAL LOW (ref 80.0–100.0)
Monocytes Absolute: 0.5 10*3/uL (ref 0.1–1.0)
Monocytes Relative: 7 %
Neutro Abs: 5.3 10*3/uL (ref 1.7–7.7)
Neutrophils Relative %: 71 %
Platelets: 444 10*3/uL — ABNORMAL HIGH (ref 150–400)
RBC: 4.85 MIL/uL (ref 3.87–5.11)
RDW: 18.5 % — ABNORMAL HIGH (ref 11.5–15.5)
WBC: 7.4 10*3/uL (ref 4.0–10.5)
nRBC: 0 % (ref 0.0–0.2)

## 2019-10-23 LAB — FIBRINOGEN: Fibrinogen: 585 mg/dL — ABNORMAL HIGH (ref 210–475)

## 2019-10-23 LAB — D-DIMER, QUANTITATIVE: D-Dimer, Quant: 1.75 ug/mL-FEU — ABNORMAL HIGH (ref 0.00–0.50)

## 2019-10-23 LAB — POC SARS CORONAVIRUS 2 AG -  ED: SARS Coronavirus 2 Ag: POSITIVE — AB

## 2019-10-23 MED ORDER — SODIUM CHLORIDE 0.9 % IV BOLUS
500.0000 mL | Freq: Once | INTRAVENOUS | Status: AC
  Administered 2019-10-24: 500 mL via INTRAVENOUS

## 2019-10-23 MED ORDER — ACETAMINOPHEN 500 MG PO TABS
1000.0000 mg | ORAL_TABLET | Freq: Once | ORAL | Status: AC
  Administered 2019-10-24: 1000 mg via ORAL
  Filled 2019-10-23: qty 2

## 2019-10-23 NOTE — ED Provider Notes (Signed)
[facility] EMERGENCY DEPARTMENT Provider Note   CSN: [id number] Arrival date & time: 10/23/19  2234     History Chief Complaint  Patient presents with  . Shortness of Breath    Makayla Vasquez is a 39 y.o. female.  Patient with PMH of lupus, CHF, interstitial lung disease, DM, HTN, asthma, and seizures with pacemaker presents to the ED with chief complaint of SOB.  She states that the symptoms have been gradually worsening for about a week.  She was seen by PCP on 12/28 for vomiting.  States that she has had some persistent nausea.  Has been holding cellcept x 2 weeks and prednisone x 1 day.  No recent COVID tests or known exposures.  Wears 4L [place] daily.  Reports increasing SOB and cough.  Noted to be mildly febrile at 100.1 in triage.  The history is provided by the patient. No language interpreter was used.       Past Medical History:  Diagnosis Date  . Anxiety   . Arthritis    rheumatoid arthritis- mild, no rheumatology care   . Asthma    seasonal allergies   . Asymptomatic LV dysfunction    a. Echo in Dec 2011 with EF 35 to 40%. Felt to be due to paced rhythm. b. EF 25-30% in 07/2014.  . Bipolar affective disorder ([place])   . Cardiac pacemaker    a. Since age 60 in 18. b. Upgrade to BiV in 2013.  [name] Carpal tunnel syndrome of right wrist   . CHF (congestive heart failure) ([place])   . Congenital complete AV block   . Cor pulmonale, chronic ([place]) 04/08/2018  . Depression    bipolar  . Diabetes mellitus without complication ([place])   . GERD (gastroesophageal reflux disease)   . Hypertension   . Interstitial lung disease ([place])   . Lupus ([place])   . Lupus (systemic lupus erythematosus) ([place])   . Obesity   . Pneumonitis    a. a/w hypoxia - inflammatory - large workup 07/2014.  . Presence of permanent cardiac pacemaker   . Seizures ([place])    as a child- from high fever  . Sinus tachycardia     Patient Active Problem List   Diagnosis Date Noted  . Increased  sputum production 10/17/2019  . Acute bronchitis 06/07/2019  . Screen for STD (sexually transmitted disease) 05/15/2019  . Moderate persistent asthma with acute exacerbation 04/19/2019  . Chronic nasal congestion 10/29/2018  . CAP (community acquired pneumonia) 10/24/2018  . Immunosuppression ([place]) 10/24/2018  . 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 Diabetes mellitus due to therapeutic use of corticosteroid ([place]) 10/24/2018  . Right wrist pain 07/24/2018  . Anxiety state 07/21/2018  . Upper airway cough syndrome 06/18/2018  . Cor pulmonale, chronic ([place]) 04/08/2018  . Abnormal uterine bleeding 02/14/2018  . Dyspareunia, female 05/14/2017  . Sepsis without acute organ dysfunction ([place]) 01/05/2017  . Emesis 01/04/2017  . 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 On Cellcept therapy   . Lupus ([place])   . Fever   . Long term (current) use of systemic steroids   . Chronic diastolic congestive heart failure ([place])   . Cough   . Interstitial lung disease ([place])   . Anemia   . Bipolar affective disorder in remission ([place])   . Acute on chronic congestive heart failure ([place])   . 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 Adrenal insufficiency ([place])   .  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 Idiopathic hypotension 07/08/2015  . Hyperlipidemia associated with type 2 diabetes mellitus ([place]) 05/04/2015  . Diabetes mellitus type 2 in obese ([place])   . Chronic respiratory failure with hypoxia ([place]) 08/07/2014  . Acute respiratory failure with hypoxia ([place]) 06/19/2014  . Congestive dilated cardiomyopathy ([place]) 05/31/2014  . Shortness of breath 05/08/2014  . Chronic systolic heart failure ([place]) 02/26/2012  . PPM-Medtronic   . Congenital complete AV block   . GERD (gastroesophageal reflux disease)   . 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 Polycystic ovaries 12/17/2006    Past Surgical History:  Procedure Laterality Date  . ATRIAL TACH  ABLATION N/A 08/14/2014   Procedure: ATRIAL TACH ABLATION;  Surgeon: [name], MD;  Location[facility] C[facility] LAB;  Service: Cardiovascular;  Laterality: N/A;  . BI-VENTRICULAR PACEMAKER UPGRADE N/A 03/08/2012   Procedure: BI-VENTRICULAR PACEMAKER UPGRADE;  Surgeon: [name], MD;  Location: [facility] CATH LAB;  Service: Cardiovascular;  Laterality: N/A;  . CARPAL TUNNEL WITH CUBITAL TUNNEL Right 07/26/2013   Procedure: RIGHT LIMITED OPEN CARPAL TUNNEL RELEASE ,  RIGHT CUBITAL TUNNEL RELEASE, INSITU VERSES ULNAR NERVE DECOMPRESSION AND ANTERIOR TRANSPOSITION;  Surgeon: [name], MD;  Location: [facility];  Service: Orthopedics;  Laterality: Right;  . CESAREAN SECTION    . CHOLECYSTECTOMY    . CYST EXCISION  12/10/2012   THYROID  . INSERT / REPLACE / REMOVE PACEMAKER     2001  . IUD REMOVAL  11/03/2011   Procedure: INTRAUTERINE DEVICE (IUD) REMOVAL;  Surgeon: [name], MD;  Location: [facility];  Service: Gynecology;  Laterality: N/A;  . NASAL FRACTURE SURGERY     /w plate   . RIGHT HEART CATHETERIZATION N/A 10/26/2014   Procedure: RIGHT HEART CATH;  Surgeon: [name], MD;  Location: [facility] CATH LAB;  Service: Cardiovascular;  Laterality: N/A;  . THROAT SURGERY  1994   s/p laser treatment  . THYROGLOSSAL DUCT CYST N/A 12/10/2012   Procedure: REVISION OF THYROGLOSSAL DUCT CYST EXCISION;  Surgeon: [name], MD;  Location: [facility];  Service: ENT;  Laterality: N/A;  Revision of Thyroglossal Duct Cyst Excision  . VIDEO BRONCHOSCOPY Bilateral 06/19/2014   Procedure: VIDEO BRONCHOSCOPY WITHOUT FLUORO;  Surgeon: Brand Males, MD;  Location: [place] [facility];  Service: Cardiopulmonary;  Laterality: Bilateral;     OB History    Gravida  1   Para  1   Term  1   Preterm      AB      Living  1     SAB      TAB      Ectopic      Multiple      Live Births              Family History  Problem Relation Age of Onset  . Heart disease Mother        CHF (no details)  . Hypertension  Mother   . Lupus Mother   . Heart disease Father        Murmur  . Heart disease Sister 2        No details.  History of a pacemaker    Social History   Tobacco Use  . Smoking status: Former Smoker    Packs/day: 0.25    Years: 0.50    Pack years: 0.12    Types: Cigarettes    Quit date: 07/25/1996    Years since quitting: 23.2  . Smokeless tobacco: Never  Used  Substance Use Topics  . Alcohol use: No  . Drug use: No    Home Medications Prior to Admission medications   Medication Sig Start Date End Date Taking? Authorizing Provider  albuterol (VENTOLIN HFA) 108 (90 Base) MCG/ACT inhaler Inhale 2 puffs into the lungs every 4 (four) hours as needed for wheezing or shortness of breath. 02/25/19   Caroline More, DO  Alcohol Swabs (B-D SINGLE USE SWABS REGULAR) PADS 1 application by Does not apply route 4 (four) times daily. 03/15/19   [place] Bing, DO  benzonatate (TESSALON PERLES) 100 MG capsule Take 1 capsule (100 mg total) by mouth 3 (three) times daily as needed for cough. 10/17/19   [place] Klein, MD  bisoprolol (ZEBETA) 5 MG tablet Take 1 tablet (5 mg total) by mouth daily. 08/09/19   Clegg, Amy D, NP  Blood Glucose Calibration (ACCU-CHEK AVIVA) SOLN 1 application by In Vitro route 4 (four) times daily. 03/18/19   [place] Bing, DO  blood glucose meter kit and supplies Dispense based on patient and insurance preference. Use up to four times daily as directed. (FOR ICD-10 E10.9, E11.9). 03/18/19   [place] Bing, DO  cromolyn (NASALCROM) 5.2 MG/ACT nasal spray Place 1 spray into both nostrils 4 (four) times daily. 08/15/19   Baird Lyons D, MD  dextromethorphan-guaiFENesin (ROBITUSSIN-DM) 10-100 MG/5ML liquid TAKE 5 MLS BY MOUTH EVERY 4 (FOUR) HOURS AS NEEDED FOR COUGH. Patient taking differently: Take 5 mLs by mouth every 4 (four) hours as needed for cough.  10/27/18   [place] Bing, DO  Etonogestrel (NEXPLANON [place]) Inject 1 Device into the skin once. Every 3 years     [provider]  ferrous sulfate 325 (65 FE) MG EC tablet Take 1 tablet (325 mg total) by mouth daily with breakfast. 08/11/19 02/07/20  Lockamy, Timothy, DO  fluticasone furoate-vilanterol (BREO ELLIPTA) 100-25 MCG/INH AEPB Inhale 1 puff into the lungs daily. 06/07/19   Baird Lyons D, MD  Fluticasone-Umeclidin-Vilant (TRELEGY ELLIPTA) 100-62.5-25 MCG/INH AEPB Inhale 1 puff into the lungs daily. Patient not taking: Reported on 08/15/2019 07/19/19   Baird Lyons D, MD  furosemide (LASIX) 40 MG tablet TAKE 1 TABLET BY MOUTH EVERY DAY 10/17/19   [name], MD  LANTUS SOLOSTAR 100 UNIT/ML Solostar Pen INJECT [place] DAY Patient taking differently: Inject 15 Units into the skin daily.  01/05/19    [place] Bing, DO  loratadine (CLARITIN) 10 MG tablet Take 1 tablet (10 mg total) by mouth daily. 08/15/19   Baird Lyons D, MD  metFORMIN (GLUCOPHAGE) 1000 MG tablet TAKE 1 TABLET BY MOUTH TWICE DAILY WITH A MEAL 03/22/19   [place] Bing, DO  mycophenolate (CELLCEPT) 500 MG tablet Take 1,000 mg by mouth 2 (two) times daily.     [provider]  NOVOLOG FLEXPEN 100 UNIT/ML FlexPen INJECT 8 UNITS INTO THE SKIN 3 TIMES DAILY WITH MEALS. Patient taking differently: Inject 8 Units into the skin 3 (three) times daily with meals.  11/02/18   [place] Bing, DO  omeprazole (PRILOSEC) 20 MG capsule TAKE 1 CAPSULE BY MOUTH EVERY DAY 05/02/19   Baird Lyons D, MD  OXYGEN Inhale 2-4 L into the lungs continuous.     [provider]  potassium chloride SA (K-DUR,KLOR-CON) 20 MEQ tablet Take 1 tablet (20 mEq total) by mouth daily. 02/01/19   Bensimhon, Shaune Pascal, MD  predniSONE (DELTASONE) 10 MG tablet Take 1 tablet (10 mg total) by mouth daily with  breakfast. 09/08/19   Baird Lyons D, MD  tacrolimus (PROGRAF) 0.5 MG capsule Take 1.5 mg by mouth 2 (two) times daily. 11/27/17   [provider]  traZODone (DESYREL) 50 MG tablet TAKE 1 TABLET (50 MG  TOTAL) BY MOUTH AT BEDTIME AS NEEDED FOR SLEEP. 09/27/18   [place] Bing, DO    Allergies    Atorvastatin, Sertraline hcl, and Tape  Review of Systems   Review of Systems  All other systems reviewed and are negative.   Physical Exam Updated Vital Signs BP 102/75   Pulse (!) 122   Temp 100.1 F (37.8 C) (Tympanic)   Resp (!) 22   Ht 5' 5" (1.651 m)   Wt 69 kg   SpO2 100%   BMI 25.31 kg/m   Physical Exam Vitals and nursing note reviewed.  Constitutional:      General: She is not in acute distress.    Appearance: She is well-developed.     Comments: On 4L [place]   HENT:     Head: Normocephalic and atraumatic.  Eyes:     Conjunctiva/sclera: Conjunctivae normal.  Cardiovascular:     Rate and Rhythm: Regular rhythm.     Heart sounds: No murmur.     Comments: Tachycardic Intact distal pulses Pulmonary:     Effort: Pulmonary effort is normal. No respiratory distress.     Breath sounds: Normal breath sounds.     Comments: Diminished in left lower lobe Otherwise clear Abdominal:     Palpations: Abdomen is soft.     Tenderness: There is no abdominal tenderness.  Musculoskeletal:        General: Normal range of motion.     Cervical back: Neck supple.     Comments: No peripheral edema  Skin:    General: Skin is warm and dry.  Neurological:     General: No focal deficit present.     Mental Status: She is alert and oriented to person, place, and time.  Psychiatric:        Mood and Affect: Mood normal.        Behavior: Behavior normal.     ED Results / Procedures / Treatments   Labs (all labs ordered are listed, but only abnormal results are displayed) Labs Reviewed  SARS CORONAVIRUS 2 (TAT 6-24 HRS)  CULTURE, BLOOD (ROUTINE X 2)  CULTURE, BLOOD (ROUTINE X 2)  LACTIC ACID, PLASMA  LACTIC ACID, PLASMA  CBC WITH DIFFERENTIAL/PLATELET  COMPREHENSIVE METABOLIC PANEL  D-DIMER, QUANTITATIVE (NOT AT [facility])  PROCALCITONIN  LACTATE DEHYDROGENASE  FERRITIN    TRIGLYCERIDES  FIBRINOGEN  C-REACTIVE PROTEIN  BRAIN NATRIURETIC PEPTIDE  I-STAT BETA HCG BLOOD, ED ([facility], [facility], [facility] ONLY)  POC SARS CORONAVIRUS 2 AG -  ED    EKG None ED ECG REPORT  I personally interpreted this EKG   Date: 10/24/2019   Rate: 122  Rhythm: sinus tachycardia  QRS Axis: indeterminate  Intervals: PR prolonged  ST/T Wave abnormalities: normal  Conduction Disutrbances:none  Narrative Interpretation: More tachycardic  Old EKG Reviewed: changes noted   Radiology DG Chest Port 1 View  Result Date: 10/23/2019 CLINICAL DATA:  Initial evaluation for acute shortness of breath. EXAM: PORTABLE CHEST 1 VIEW COMPARISON:  Prior radiograph from 10/04/2019. FINDINGS: Left-sided pacemaker/AICD in place, stable. Cardiomegaly, unchanged. Mediastinal silhouette within normal limits. Lung volumes are reduced. Extensive chronic coarsening of the interstitial markings with fibrotic changes, relatively unchanged. No definite superimposed focal airspace disease. No overt pulmonary edema. No pleural effusion. No  pneumothorax. Osseous structures unchanged. IMPRESSION: 1. Stable appearance of the chest with extensive chronic coarsening of the interstitial markings and fibrotic lung changes. No definite superimposed active cardiopulmonary process identified. 2. Stable cardiomegaly without overt pulmonary edema. Electronically Signed   By: Jeannine Boga M.D.   On: 10/23/2019 23:07    Procedures .Critical Care Performed by: Montine Circle, PA-C Authorized by: Montine Circle, PA-C   Critical care provider statement:    Critical care time (minutes):  32   Critical care was necessary to treat or prevent imminent or life-threatening deterioration of the following conditions:  Respiratory failure   Critical care was time spent personally by me on the following activities:  Discussions with consultants, evaluation of patient's response to treatment, examination of patient, ordering and performing  treatments and interventions, ordering and review of laboratory studies, ordering and review of radiographic studies, pulse oximetry, re-evaluation of patient's condition, obtaining history from patient or surrogate and review of old charts    Medications Ordered in ED Medications - No data to display  ED Course  I have reviewed the triage vital signs and the nursing notes.  Pertinent labs & imaging results that were available during my care of the patient were reviewed by me and considered in my medical decision making (see chart for details).    MDM Rules/Calculators/A&P                      Patient with cough, fever, and nausea.  Hasn't been feeling well for about a week, but has had worsening SOB and cough.  EMS reports 88% on RA, but patient wears 4L [place].  She is immunocompromised due to her taking Cellcept and prednisone.  No recent COVID tests.  I do have concern for COVID.    POC positive for covid.  Given multiple co morbidities will need admission.  Currently requiring 4L [place].  Tachypneic to the low 30s, mildly increased work of breathing, but not in distress.  Discussed case with Dr. Christy Gentles, who agrees with plan for admission.  Appreciate Dr. Chauncey Reading and [facility] for admitting.  Makayla Vasquez was evaluated in Emergency Department on 10/23/2019 for the symptoms described in the history of present illness. She was evaluated in the context of the global COVID-19 pandemic, which necessitated consideration that the patient might be at risk for infection with the SARS-CoV-2 virus that causes COVID-19. Institutional protocols and algorithms that pertain to the evaluation of patients at risk for COVID-19 are in a state of rapid change based on information released by regulatory bodies including the CDC and federal and state organizations. These policies and algorithms were followed during the patient's care in the ED.  Final Clinical Impression(s) / ED Diagnoses Final  diagnoses:  COVID-19  SOB (shortness of breath)    Rx / DC Orders ED Discharge Orders    None       Montine Circle, PA-C 10/24/19 0038    [name], MD 10/24/19 [phone]

## 2019-10-23 NOTE — ED Triage Notes (Signed)
Pt transported from home by EMS with c/o shob, cough, pt was seen by MD for same, pt did not pick up meds prescribed from pharmacy yet, pt states she has not taken Zebeta for several days because she was not home, was told not to take Celcept and has not taken steroids today.  Pt c/o pain to legs and pain to chest with deep inspiration.

## 2019-10-24 DIAGNOSIS — R0602 Shortness of breath: Secondary | ICD-10-CM | POA: Diagnosis present

## 2019-10-24 DIAGNOSIS — E669 Obesity, unspecified: Secondary | ICD-10-CM | POA: Diagnosis present

## 2019-10-24 DIAGNOSIS — A4101 Sepsis due to Methicillin susceptible Staphylococcus aureus: Secondary | ICD-10-CM | POA: Diagnosis not present

## 2019-10-24 DIAGNOSIS — M069 Rheumatoid arthritis, unspecified: Secondary | ICD-10-CM | POA: Diagnosis present

## 2019-10-24 DIAGNOSIS — E876 Hypokalemia: Secondary | ICD-10-CM | POA: Diagnosis present

## 2019-10-24 DIAGNOSIS — Q246 Congenital heart block: Secondary | ICD-10-CM | POA: Diagnosis not present

## 2019-10-24 DIAGNOSIS — I340 Nonrheumatic mitral (valve) insufficiency: Secondary | ICD-10-CM | POA: Diagnosis not present

## 2019-10-24 DIAGNOSIS — R7881 Bacteremia: Secondary | ICD-10-CM | POA: Diagnosis present

## 2019-10-24 DIAGNOSIS — I110 Hypertensive heart disease with heart failure: Secondary | ICD-10-CM | POA: Diagnosis present

## 2019-10-24 DIAGNOSIS — J449 Chronic obstructive pulmonary disease, unspecified: Secondary | ICD-10-CM | POA: Diagnosis not present

## 2019-10-24 DIAGNOSIS — Z8249 Family history of ischemic heart disease and other diseases of the circulatory system: Secondary | ICD-10-CM | POA: Diagnosis not present

## 2019-10-24 DIAGNOSIS — J9611 Chronic respiratory failure with hypoxia: Secondary | ICD-10-CM | POA: Diagnosis not present

## 2019-10-24 DIAGNOSIS — Z832 Family history of diseases of the blood and blood-forming organs and certain disorders involving the immune mechanism: Secondary | ICD-10-CM | POA: Diagnosis not present

## 2019-10-24 DIAGNOSIS — I5023 Acute on chronic systolic (congestive) heart failure: Secondary | ICD-10-CM | POA: Diagnosis present

## 2019-10-24 DIAGNOSIS — I420 Dilated cardiomyopathy: Secondary | ICD-10-CM | POA: Diagnosis present

## 2019-10-24 DIAGNOSIS — N179 Acute kidney failure, unspecified: Secondary | ICD-10-CM | POA: Diagnosis present

## 2019-10-24 DIAGNOSIS — I2729 Other secondary pulmonary hypertension: Secondary | ICD-10-CM | POA: Diagnosis present

## 2019-10-24 DIAGNOSIS — M609 Myositis, unspecified: Secondary | ICD-10-CM | POA: Diagnosis present

## 2019-10-24 DIAGNOSIS — E871 Hypo-osmolality and hyponatremia: Secondary | ICD-10-CM | POA: Diagnosis present

## 2019-10-24 DIAGNOSIS — F3170 Bipolar disorder, currently in remission, most recent episode unspecified: Secondary | ICD-10-CM | POA: Diagnosis present

## 2019-10-24 DIAGNOSIS — J1282 Pneumonia due to coronavirus disease 2019: Secondary | ICD-10-CM | POA: Diagnosis present

## 2019-10-24 DIAGNOSIS — E1169 Type 2 diabetes mellitus with other specified complication: Secondary | ICD-10-CM | POA: Diagnosis present

## 2019-10-24 DIAGNOSIS — M329 Systemic lupus erythematosus, unspecified: Secondary | ICD-10-CM | POA: Diagnosis present

## 2019-10-24 DIAGNOSIS — J9621 Acute and chronic respiratory failure with hypoxia: Secondary | ICD-10-CM | POA: Diagnosis present

## 2019-10-24 DIAGNOSIS — I361 Nonrheumatic tricuspid (valve) insufficiency: Secondary | ICD-10-CM | POA: Diagnosis not present

## 2019-10-24 DIAGNOSIS — E785 Hyperlipidemia, unspecified: Secondary | ICD-10-CM | POA: Diagnosis present

## 2019-10-24 DIAGNOSIS — B9561 Methicillin susceptible Staphylococcus aureus infection as the cause of diseases classified elsewhere: Secondary | ICD-10-CM | POA: Diagnosis present

## 2019-10-24 DIAGNOSIS — J440 Chronic obstructive pulmonary disease with acute lower respiratory infection: Secondary | ICD-10-CM | POA: Diagnosis present

## 2019-10-24 DIAGNOSIS — U071 COVID-19: Principal | ICD-10-CM

## 2019-10-24 DIAGNOSIS — K219 Gastro-esophageal reflux disease without esophagitis: Secondary | ICD-10-CM | POA: Diagnosis present

## 2019-10-24 LAB — LIPID PANEL
Cholesterol: 156 mg/dL (ref 0–200)
HDL: 26 mg/dL — ABNORMAL LOW (ref >40)
LDL Cholesterol: 108 mg/dL — ABNORMAL HIGH (ref 0–99)
Total CHOL/HDL Ratio: 6 RATIO
Triglycerides: 112 mg/dL (ref <150)
VLDL: 22 mg/dL (ref 0–40)

## 2019-10-24 LAB — URINALYSIS, ROUTINE W REFLEX MICROSCOPIC
Bilirubin Urine: NEGATIVE
Glucose, UA: NEGATIVE mg/dL
Ketones, ur: NEGATIVE mg/dL
Nitrite: NEGATIVE
Protein, ur: NEGATIVE mg/dL
RBC / HPF: >50 RBC/hpf — ABNORMAL HIGH (ref 0–5)
Specific Gravity, Urine: 1.006 (ref 1.005–1.030)
pH: 6 (ref 5.0–8.0)

## 2019-10-24 LAB — COMPREHENSIVE METABOLIC PANEL
ALT: 11 U/L (ref 0–44)
AST: 19 U/L (ref 15–41)
Albumin: 3.4 g/dL — ABNORMAL LOW (ref 3.5–5.0)
Alkaline Phosphatase: 63 U/L (ref 38–126)
Anion gap: 10 (ref 5–15)
BUN: 14 mg/dL (ref 6–20)
CO2: 23 mmol/L (ref 22–32)
Calcium: 8.5 mg/dL — ABNORMAL LOW (ref 8.9–10.3)
Chloride: 100 mmol/L (ref 98–111)
Creatinine, Ser: 1.04 mg/dL — ABNORMAL HIGH (ref 0.44–1.00)
GFR calc Af Amer: >60 mL/min (ref >60)
GFR calc non Af Amer: >60 mL/min (ref >60)
Glucose, Bld: 167 mg/dL — ABNORMAL HIGH (ref 70–99)
Potassium: 4.1 mmol/L (ref 3.5–5.1)
Sodium: 133 mmol/L — ABNORMAL LOW (ref 135–145)
Total Bilirubin: 0.5 mg/dL (ref 0.3–1.2)
Total Protein: 7.6 g/dL (ref 6.5–8.1)

## 2019-10-24 LAB — CREATININE, SERUM
Creatinine, Ser: 0.9 mg/dL (ref 0.44–1.00)
GFR calc Af Amer: >60 mL/min (ref >60)
GFR calc non Af Amer: >60 mL/min (ref >60)

## 2019-10-24 LAB — BRAIN NATRIURETIC PEPTIDE: B Natriuretic Peptide: 133.7 pg/mL — ABNORMAL HIGH (ref 0.0–100.0)

## 2019-10-24 LAB — CBC
HCT: 32.5 % — ABNORMAL LOW (ref 36.0–46.0)
Hemoglobin: 8.7 g/dL — ABNORMAL LOW (ref 12.0–15.0)
MCH: 20.1 pg — ABNORMAL LOW (ref 26.0–34.0)
MCHC: 26.8 g/dL — ABNORMAL LOW (ref 30.0–36.0)
MCV: 75.2 fL — ABNORMAL LOW (ref 80.0–100.0)
Platelets: 378 10*3/uL (ref 150–400)
RBC: 4.32 MIL/uL (ref 3.87–5.11)
RDW: 18.5 % — ABNORMAL HIGH (ref 11.5–15.5)
WBC: 7.3 10*3/uL (ref 4.0–10.5)
nRBC: 0 % (ref 0.0–0.2)

## 2019-10-24 LAB — CBG MONITORING, ED
Glucose-Capillary: 182 mg/dL — ABNORMAL HIGH (ref 70–99)
Glucose-Capillary: 286 mg/dL — ABNORMAL HIGH (ref 70–99)
Glucose-Capillary: 168 mg/dL — ABNORMAL HIGH (ref 70–99)
Glucose-Capillary: 173 mg/dL — ABNORMAL HIGH (ref 70–99)

## 2019-10-24 LAB — PROCALCITONIN: Procalcitonin: <0.1 ng/mL

## 2019-10-24 LAB — LACTATE DEHYDROGENASE: LDH: 405 U/L — ABNORMAL HIGH (ref 98–192)

## 2019-10-24 LAB — I-STAT BETA HCG BLOOD, ED (MC, WL, AP ONLY): I-stat hCG, quantitative: <5 m[IU]/mL (ref <5)

## 2019-10-24 LAB — FERRITIN: Ferritin: 19 ng/mL (ref 11–307)

## 2019-10-24 LAB — TRIGLYCERIDES: Triglycerides: 153 mg/dL — ABNORMAL HIGH (ref <150)

## 2019-10-24 LAB — C-REACTIVE PROTEIN: CRP: 1.8 mg/dL — ABNORMAL HIGH (ref <1.0)

## 2019-10-24 LAB — LACTIC ACID, PLASMA
Lactic Acid, Venous: 1.7 mmol/L (ref 0.5–1.9)
Lactic Acid, Venous: 1.1 mmol/L (ref 0.5–1.9)

## 2019-10-24 MED ORDER — ALBUTEROL SULFATE HFA 108 (90 BASE) MCG/ACT IN AERS
2.0000 | INHALATION_SPRAY | RESPIRATORY_TRACT | Status: DC | PRN
  Administered 2019-10-25: 2 via RESPIRATORY_TRACT
  Filled 2019-10-24: qty 6.7

## 2019-10-24 MED ORDER — LORATADINE 10 MG PO TABS
10.0000 mg | ORAL_TABLET | Freq: Every day | ORAL | Status: DC
  Administered 2019-10-24 – 2019-11-02 (×10): 10 mg via ORAL
  Filled 2019-10-24 (×10): qty 1

## 2019-10-24 MED ORDER — INSULIN ASPART 100 UNIT/ML ~~LOC~~ SOLN
0.0000 [IU] | SUBCUTANEOUS | Status: DC
  Administered 2019-10-24: 2 [IU] via SUBCUTANEOUS
  Administered 2019-10-24: 5 [IU] via SUBCUTANEOUS
  Administered 2019-10-24 (×2): 2 [IU] via SUBCUTANEOUS
  Administered 2019-10-25 (×2): 5 [IU] via SUBCUTANEOUS
  Administered 2019-10-25 (×2): 2 [IU] via SUBCUTANEOUS
  Administered 2019-10-25: 5 [IU] via SUBCUTANEOUS
  Administered 2019-10-25: 2 [IU] via SUBCUTANEOUS
  Administered 2019-10-26: 5 [IU] via SUBCUTANEOUS
  Administered 2019-10-26 (×2): 2 [IU] via SUBCUTANEOUS
  Administered 2019-10-26: 1 [IU] via SUBCUTANEOUS
  Administered 2019-10-26: 7 [IU] via SUBCUTANEOUS
  Administered 2019-10-26: 1 [IU] via SUBCUTANEOUS
  Administered 2019-10-27 (×2): 5 [IU] via SUBCUTANEOUS
  Administered 2019-10-27: 3 [IU] via SUBCUTANEOUS

## 2019-10-24 MED ORDER — FERROUS SULFATE 325 (65 FE) MG PO TABS
325.0000 mg | ORAL_TABLET | Freq: Every day | ORAL | Status: DC
  Administered 2019-10-24 – 2019-11-02 (×8): 325 mg via ORAL
  Filled 2019-10-24 (×8): qty 1

## 2019-10-24 MED ORDER — BISOPROLOL FUMARATE 5 MG PO TABS
5.0000 mg | ORAL_TABLET | Freq: Every day | ORAL | Status: DC
  Administered 2019-10-24 – 2019-10-27 (×2): 5 mg via ORAL
  Filled 2019-10-24 (×6): qty 1

## 2019-10-24 MED ORDER — FLUTICASONE FUROATE-VILANTEROL 100-25 MCG/INH IN AEPB
1.0000 | INHALATION_SPRAY | Freq: Every day | RESPIRATORY_TRACT | Status: DC
  Administered 2019-10-26 – 2019-11-02 (×8): 1 via RESPIRATORY_TRACT
  Filled 2019-10-24 (×2): qty 28

## 2019-10-24 MED ORDER — SODIUM CHLORIDE 0.9 % IV SOLN
100.0000 mg | Freq: Every day | INTRAVENOUS | Status: AC
  Administered 2019-10-25 – 2019-10-28 (×4): 100 mg via INTRAVENOUS
  Filled 2019-10-24 (×5): qty 20

## 2019-10-24 MED ORDER — PANTOPRAZOLE SODIUM 40 MG PO TBEC
40.0000 mg | DELAYED_RELEASE_TABLET | Freq: Every day | ORAL | Status: DC
  Administered 2019-10-24 – 2019-11-02 (×10): 40 mg via ORAL
  Filled 2019-10-24 (×10): qty 1

## 2019-10-24 MED ORDER — DEXAMETHASONE 6 MG PO TABS
6.0000 mg | ORAL_TABLET | ORAL | Status: DC
  Administered 2019-10-24 – 2019-11-01 (×9): 6 mg via ORAL
  Filled 2019-10-24 (×4): qty 1
  Filled 2019-10-24: qty 2
  Filled 2019-10-24 (×4): qty 1

## 2019-10-24 MED ORDER — POLYETHYLENE GLYCOL 3350 17 G PO PACK: 17.0000 g | PACK | Freq: Every day | ORAL | Status: DC | PRN

## 2019-10-24 MED ORDER — DEXAMETHASONE SODIUM PHOSPHATE 10 MG/ML IJ SOLN
6.0000 mg | Freq: Once | INTRAMUSCULAR | Status: AC
  Administered 2019-10-24: 6 mg via INTRAVENOUS
  Filled 2019-10-24: qty 1

## 2019-10-24 MED ORDER — TACROLIMUS 1 MG PO CAPS
1.5000 mg | ORAL_CAPSULE | Freq: Two times a day (BID) | ORAL | Status: DC
  Administered 2019-10-24 – 2019-10-25 (×3): 1.5 mg via ORAL
  Filled 2019-10-24 (×7): qty 1

## 2019-10-24 MED ORDER — ACETAMINOPHEN 325 MG PO TABS: 650.0000 mg | ORAL_TABLET | Freq: Four times a day (QID) | ORAL | Status: DC | PRN

## 2019-10-24 MED ORDER — SODIUM CHLORIDE 0.9 % IV SOLN
200.0000 mg | Freq: Once | INTRAVENOUS | Status: AC
  Administered 2019-10-24: 200 mg via INTRAVENOUS
  Filled 2019-10-24: qty 200

## 2019-10-24 MED ORDER — ENOXAPARIN SODIUM 40 MG/0.4ML ~~LOC~~ SOLN
40.0000 mg | Freq: Every day | SUBCUTANEOUS | Status: DC
  Administered 2019-10-24 – 2019-11-02 (×10): 40 mg via SUBCUTANEOUS
  Filled 2019-10-24 (×10): qty 0.4

## 2019-10-24 MED ORDER — CROMOLYN SODIUM 5.2 MG/ACT NA AERS: 1.0000 | INHALATION_SPRAY | Freq: Four times a day (QID) | NASAL | Status: DC

## 2019-10-24 MED ORDER — FUROSEMIDE 10 MG/ML IJ SOLN
40.0000 mg | Freq: Two times a day (BID) | INTRAMUSCULAR | Status: AC
  Administered 2019-10-24 (×2): 40 mg via INTRAVENOUS
  Filled 2019-10-24 (×2): qty 4

## 2019-10-24 MED ORDER — FUROSEMIDE 10 MG/ML IJ SOLN: 40.0000 mg | Freq: Two times a day (BID) | INTRAMUSCULAR | Status: DC

## 2019-10-24 MED ORDER — TRAZODONE HCL 50 MG PO TABS
50.0000 mg | ORAL_TABLET | Freq: Every evening | ORAL | Status: DC | PRN
  Administered 2019-11-01: 50 mg via ORAL
  Filled 2019-10-24: qty 1

## 2019-10-24 NOTE — H&P (Addendum)
[facility] Admission History and Physical Service Pager: [phone]  Patient name: Makayla Vasquez Medical record number: [id number] Date of birth: 1981/03/02 Age: 39 y.o. Gender: female  Primary Care Provider: Nuala Alpha, DO Consultants: None Code Status: Full Preferred Emergency Contact: [name], [name], [phone]  Chief Complaint: extreme fatigue  Assessment and Plan: Makayla Vasquez is a 39 y.o. female presenting with COVID-19. PMH is significant for lupus, antisynthetase syndrome (anti-Jo-1 antibody, interstitial lung disease, myositis and elevated CK), acute on chronic respiratory failure/hypoxemia, pacemaker for congenital AV block, pulmonary hypertension, chronic dilated cardiomyopathy, T2DM, GERD, HLD.  COVID-19  acute on chronic respiratory failure  hypoxemia Patient presenting with fatigue, decreased appetite, increased SOB, cough, diarrhea.  Complaints of SOB and increased sputum occurring for approximately 1 month at least.  At baseline she has chronic interstitial lung disease and a baseline oxygen requirement of 2 to 4 L at home.  Covid positive in the ED where she received half a liter bolus, 1 dose of Decadron, remdesivir started.  CXR showed no acute infectious changes, no pulmonary edema, just findings consistent with her chronic lung disease.  Labs were largely unrevealing with some mild elevation of inflammatory markers: no leukocytosis, mild hyponatremia to 133, creatinine mildly elevated to 1.04 (baseline 0.7 -0.9), BNP mildly elevated to 133.7 (was 52 weeks ago in December), LDH elevated at 405, triglycerides elevated 153, ferritin 19, CRP 1.8, lactic acid 1.7, pro-calcitonin WNL at <0.10, D-dimer only elevated at 1.75, fibrinogen 585.  Blood cultures obtained in the ED.  Patient has acute on chronic respiratory failure/hypoxemia with ILD and is on oxygen at baseline 2 to 4 L via nasal cannula.  She is mildly tachycardic to the  110s, tachypneic from the 30s to 40s, but satting in the mid 90s on 4 L nasal cannula.  No evidence of volume overload.  Overall her condition is stable at this time, however due to her significant comorbidities, admission is indicated for monitoring to prevent precipitous deterioration.  Patient is followed by Dr. Baird Lyons at [facility] pulmonology.  - Admit to FPTS, medical telemetry, attending Dr. Andria Frames - Airborne and contact precautions - Continuous cardiac monitoring and pulse oximetry - Remdesivir infusion per pharmacy consult x5 days - Decadron 6 mg p.o. daily for 10 days - Oxygen therapy via [place] with goal greater than 90% SpO2 while awake - Heart healthy, carb modified diet -AM CRP, BMP, CBC  Lupus  Anti-Synthetase Syndrome (anti-Jo-1 antibody, interstitial lung disease, myositis, and elevated CK) Patient is followed by Tonna Corner at [facility] rheumatology.  CK is chronically elevated and is sensitive to steroids.  Home medications include CellCept, tacrolimus, prednisone 10 mg.  Patient says she has not taken her CellCept since last week due to not feeling well. -Continue holding CellCept at this time -Hold prednisone for Decadron treatment -Continue tacrolimus  HFrEF d/t NICM   Initial Echo 05/2014 EF 20-25%, echo 1/19 improved with EF 45-50% and mild-moderately decreased systolic function. Echo 11/2018 EF 35-40% with mild RV systolic dysfunction. NYHA IIIb chronically.  On home oxygen. Home medications include bisoprolol 5 mg daily, Lasix 40 mg daily, potassium.  Patient was recently to begin cardiac and pulmonary rehab, she is followed by the heart failure clinic.  Does not appear volume overloaded on exam, no pitting edema, no crackles.  CXR shows no pulmonary edema -Continue bisoprolol 5 mg daily -Hold Lasix and potassium -Daily weights -Daily BMP  Congenital AV block s/p pacemaker Congenital heart block underwent  first PPM at age 4 (mother had SLE). 2011 had echo with EF  35 to 40%.  The dysfunction was thought to be due to RV pacing so upgraded to CRT-P.  Patient follows with Dr. Lovena Le in EP.  Last battery change was in 2004, according to notes patient is due for a battery change.  ILD Former smoker.  Initially patient was referred to asthma and allergy for allergy concerns in 2015.  However she was admitted in August and September 2015 with worsening respiratory status: severe hypoxia with extensive work-up. She was thought to have a viral pneumonitis at that time complicated by atelectasis and mild edema versus autoimmune process.  She was offered a lung biopsy for her diagnosis versus empiric steroid treatment, and she opted for treatment with steroids.  She is followed by Dr. Baird Lyons at [facility] pulmonology. Home medications albuterol 2 puffs every 4 hours as needed, cromolyn, prednisone 10 mg daily, Breo Ellipta.  Patient is on 2 to 4 L oxygen via [place] at baseline. -Continue home medications  T2DM Last A1c 7.0 on October 03, 2019.  Home medications include Lantus 15 units nightly, Metformin 1000 mg twice daily, NovoLog 8 units 3 times daily before every meal.  CBG was sub 200 in the ED. -Hold home medications -Sensitive sliding scale insulin -Monitor blood glucose and after 24 hours assess basal insulin needs in the hospital  GERD Home medications includes omeprazole. -Pantoprazole on formulary  HLD Last lipid profile was in 2016: Total cholesterol 200, HDL 31, LDL 136, triglycerides 163.  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 -Obtain lipid panel  FEN/GI: Heart healthy carb modified diet, pantoprazole Prophylaxis: Lovenox  Disposition: Admit to med telemetry pending further work-up and observation  History of Present Illness:  Makayla Vasquez is a 39 y.o. female presenting with extreme fatigue and diminished appetite. She has had worsened shortness of breath beyond her  baseline with increased cough since just after Christmas.  She also had some nausea and diarrhea since after Christmas.  Patient was seen in our clinic on 12/28 for similar symptoms.  She also endorses coughing up thick clear mucus, no streaks of blood, and having sharp chest pain while coughing only.  She reports diffuse, mild tenderness in her abdomen.  She has had chills and body aches, but no fevers.  She was with her child, who is 68 years old, last week.  Her last contact with him was on Friday before he went to his father's house.  She has no known sick contacts.  Review Of Systems: Per HPI with the following additions:   Review of Systems  Constitutional: Positive for chills and malaise/fatigue. Negative for fever.  HENT: Negative for sore throat.   Respiratory: Positive for cough, sputum production and shortness of breath. Negative for hemoptysis and wheezing.   Cardiovascular: Positive for chest pain (Pleuritic).  Gastrointestinal: Positive for abdominal pain, diarrhea and nausea. Negative for vomiting.  Genitourinary: Negative for dysuria.  Musculoskeletal: Positive for myalgias.  Neurological: Positive for headaches.    Patient Active Problem List   Diagnosis Date Noted  . Increased sputum production 10/17/2019  . Acute bronchitis 06/07/2019  . Screen for STD (sexually transmitted disease) 05/15/2019  . Moderate persistent asthma with acute exacerbation 04/19/2019  . Chronic nasal congestion 10/29/2018  . CAP (community acquired pneumonia) 10/24/2018  . Immunosuppression ([place]) 10/24/2018  . Pulmonary disease due to systemic lupus erythematosus (SLE) (  [place]) 10/24/2018  . Diabetes mellitus due to therapeutic use of corticosteroid ([place]) 10/24/2018  . Right wrist pain 07/24/2018  . Anxiety state 07/21/2018  . Upper airway cough syndrome 06/18/2018  . Cor pulmonale, chronic ([place]) 04/08/2018  . Abnormal uterine bleeding 02/14/2018  . Dyspareunia, female 05/14/2017  . Sepsis  without acute organ dysfunction ([place]) 01/05/2017  . Emesis 01/04/2017  . 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 On Cellcept therapy   . Lupus ([place])   . Fever   . Long term (current) use of systemic steroids   . Chronic diastolic congestive heart failure ([place])   . Cough   . Interstitial lung disease ([place])   . Anemia   . Bipolar affective disorder in remission ([place])   . Acute on chronic congestive heart failure ([place])   . 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 Adrenal insufficiency ([place])   . Antisynthetase syndrome ([place])   . Idiopathic hypotension 07/08/2015  . Hyperlipidemia associated with type 2 diabetes mellitus ([place]) 05/04/2015  . Diabetes mellitus type 2 in obese ([place])   . Chronic respiratory failure with hypoxia ([place]) 08/07/2014  . Acute respiratory failure with hypoxia ([place]) 06/19/2014  . Congestive dilated cardiomyopathy ([place]) 05/31/2014  . Shortness of breath 05/08/2014  . Chronic systolic heart failure ([place]) 02/26/2012  . PPM-Medtronic   . Congenital complete AV block   . GERD (gastroesophageal reflux disease)   . 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 Polycystic ovaries 12/17/2006    Past Medical History: Past Medical History:  Diagnosis Date  . Anxiety   . Arthritis    rheumatoid arthritis- mild, no rheumatology care   . Asthma    seasonal allergies   . Asymptomatic LV dysfunction    a. Echo in Dec 2011 with EF 35 to 40%. Felt to be due to paced rhythm. b. EF 25-30% in 07/2014.  . Bipolar affective disorder ([place])   . Cardiac pacemaker    a. Since age 89 in 38. b. Upgrade to BiV in 2013.  [name] Carpal tunnel syndrome of right wrist   . CHF (congestive heart failure) ([place])   . Congenital complete AV block   . Cor pulmonale, chronic ([place]) 04/08/2018  . Depression    bipolar  . Diabetes mellitus without complication ([place])   . GERD (gastroesophageal reflux disease)   . Hypertension   . Interstitial lung disease ([place])   . Lupus ([place])   .  Lupus (systemic lupus erythematosus) ([place])   . Obesity   . Pneumonitis    a. a/w hypoxia - inflammatory - large workup 07/2014.  . Presence of permanent cardiac pacemaker   . Seizures ([place])    as a child- from high fever  . Sinus tachycardia     Past Surgical History: Past Surgical History:  Procedure Laterality Date  . ATRIAL TACH ABLATION N/A 08/14/2014   Procedure: ATRIAL TACH ABLATION;  Surgeon: [name], MD;  Location: [facility] CATH LAB;  Service: Cardiovascular;  Laterality: N/A;  . BI-VENTRICULAR PACEMAKER UPGRADE N/A 03/08/2012   Procedure: BI-VENTRICULAR PACEMAKER UPGRADE;  Surgeon: [name], MD;  Location: [facility] CATH LAB;  Service: Cardiovascular;  Laterality: N/A;  . CARPAL TUNNEL WITH CUBITAL TUNNEL Right 07/26/2013   Procedure: RIGHT LIMITED OPEN CARPAL TUNNEL RELEASE ,  RIGHT CUBITAL TUNNEL RELEASE, INSITU VERSES ULNAR NERVE DECOMPRESSION AND ANTERIOR TRANSPOSITION;  Surgeon: [name], MD;  Location: [facility];  Service: Orthopedics;  Laterality: Right;  . CESAREAN SECTION    . CHOLECYSTECTOMY    .  CYST EXCISION  12/10/2012   THYROID  . INSERT / REPLACE / REMOVE PACEMAKER     2001  . IUD REMOVAL  11/03/2011   Procedure: INTRAUTERINE DEVICE (IUD) REMOVAL;  Surgeon: [name], MD;  Location: [facility];  Service: Gynecology;  Laterality: N/A;  . NASAL FRACTURE SURGERY     /w plate   . RIGHT HEART CATHETERIZATION N/A 10/26/2014   Procedure: RIGHT HEART CATH;  Surgeon: [name], MD;  Location: [facility] CATH LAB;  Service: Cardiovascular;  Laterality: N/A;  . THROAT SURGERY  1994   s/p laser treatment  . THYROGLOSSAL DUCT CYST N/A 12/10/2012   Procedure: REVISION OF THYROGLOSSAL DUCT CYST EXCISION;  Surgeon: [name], MD;  Location: [facility];  Service: ENT;  Laterality: N/A;  Revision of Thyroglossal Duct Cyst Excision  . VIDEO BRONCHOSCOPY Bilateral 06/19/2014   Procedure: VIDEO BRONCHOSCOPY WITHOUT FLUORO;  Surgeon: Brand Males, MD;  Location: [facility];   Service: Cardiopulmonary;  Laterality: Bilateral;    Social History: Social History   Tobacco Use  . Smoking status: Former Smoker    Packs/day: 0.25    Years: 0.50    Pack years: 0.12    Types: Cigarettes    Quit date: 07/25/1996    Years since quitting: 23.2  . Smokeless tobacco: Never Used  Substance Use Topics  . Alcohol use: No  . Drug use: No   Additional social history:  Please also refer to relevant sections of EMR.  Family History: Family History  Problem Relation Age of Onset  . Heart disease Mother        CHF (no details)  . Hypertension Mother   . Lupus Mother   . Heart disease Father        Murmur  . Heart disease Sister 13        No details.  History of a pacemaker    Allergies and Medications: Allergies  Allergen Reactions  . 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 Sertraline Hcl Hives  . Tape Other (See Comments)    "Burns" the skin   No current facility-administered medications on file prior to encounter.   Current Outpatient Medications on File Prior to Encounter  Medication Sig Dispense Refill  . albuterol (VENTOLIN HFA) 108 (90 Base) MCG/ACT inhaler Inhale 2 puffs into the lungs every 4 (four) hours as needed for wheezing or shortness of breath. 6.7 Inhaler 0  . Alcohol Swabs (B-D SINGLE USE SWABS REGULAR) PADS 1 application by Does not apply route 4 (four) times daily. 120 each 2  . benzonatate (TESSALON PERLES) 100 MG capsule Take 1 capsule (100 mg total) by mouth 3 (three) times daily as needed for cough. 20 capsule 0  . bisoprolol (ZEBETA) 5 MG tablet Take 1 tablet (5 mg total) by mouth daily. 90 tablet 1  . Blood Glucose Calibration (ACCU-CHEK AVIVA) SOLN 1 application by In Vitro route 4 (four) times daily. 1 each 0  . blood glucose meter kit and supplies Dispense based on patient and insurance preference. Use up to four times daily as directed. (FOR ICD-10 E10.9,  E11.9). 1 each 0  . cromolyn (NASALCROM) 5.2 MG/ACT nasal spray Place 1 spray into both nostrils 4 (four) times daily. 26 mL 12  . dextromethorphan-guaiFENesin (ROBITUSSIN-DM) 10-100 MG/5ML liquid TAKE 5 MLS BY MOUTH EVERY 4 (FOUR) HOURS AS NEEDED FOR COUGH. (Patient taking differently: Take 5 mLs by mouth every 4 (  four) hours as needed for cough. ) 118 mL 0  . Etonogestrel (NEXPLANON [place]) Inject 1 Device into the skin once. Every 3 years    . ferrous sulfate 325 (65 FE) MG EC tablet Take 1 tablet (325 mg total) by mouth daily with breakfast. 90 tablet 1  . fluticasone furoate-vilanterol (BREO ELLIPTA) 100-25 MCG/INH AEPB Inhale 1 puff into the lungs daily. 1 each 5  . Fluticasone-Umeclidin-Vilant (TRELEGY ELLIPTA) 100-62.5-25 MCG/INH AEPB Inhale 1 puff into the lungs daily. (Patient not taking: Reported on 08/15/2019) 2 each 0  . furosemide (LASIX) 40 MG tablet TAKE 1 TABLET BY MOUTH EVERY DAY 90 tablet 1  . LANTUS SOLOSTAR 100 UNIT/ML Solostar Pen INJECT 15 UNITS SUBCUTANEOUSLY EVERY DAY (Patient taking differently: Inject 15 Units into the skin daily. ) 15 pen 1  . loratadine (CLARITIN) 10 MG tablet Take 1 tablet (10 mg total) by mouth daily. 30 tablet 11  . metFORMIN (GLUCOPHAGE) 1000 MG tablet TAKE 1 TABLET BY MOUTH TWICE DAILY WITH A MEAL 90 tablet 2  . mycophenolate (CELLCEPT) 500 MG tablet Take 1,000 mg by mouth 2 (two) times daily.     [name] NOVOLOG FLEXPEN 100 UNIT/ML FlexPen INJECT 8 UNITS INTO THE SKIN 3 TIMES DAILY WITH MEALS. (Patient taking differently: Inject 8 Units into the skin 3 (three) times daily with meals. ) 15 pen 4  . omeprazole (PRILOSEC) 20 MG capsule TAKE 1 CAPSULE BY MOUTH EVERY DAY 90 capsule 1  . OXYGEN Inhale 2-4 L into the lungs continuous.     . potassium chloride SA (K-DUR,KLOR-CON) 20 MEQ tablet Take 1 tablet (20 mEq total) by mouth daily. 90 tablet 3  . predniSONE (DELTASONE) 10 MG tablet Take 1 tablet (10 mg total) by mouth daily with breakfast. 30 tablet 5  .  tacrolimus (PROGRAF) 0.5 MG capsule Take 1.5 mg by mouth 2 (two) times daily.    . traZODone (DESYREL) 50 MG tablet TAKE 1 TABLET (50 MG TOTAL) BY MOUTH AT BEDTIME AS NEEDED FOR SLEEP. 90 tablet 2    Objective: BP 113/80   Pulse (!) 119   Temp 100.1 F (37.8 C) (Tympanic)   Resp (!) 38   Ht 5' 5"  (1.651 m)   Wt 69 kg   SpO2 100%   BMI 25.31 kg/m  Exam: General: pleasant, thin woman, resting in bed comfortably, WNWD, NAD Eyes: Anicteric sclerae, without conjunctival injection ENTM: Clear oropharynx, moist mucous membranes Neck: Supple, no LAD Cardiovascular: Regular rate and rhythm, no M/R/G Respiratory: Clear to auscultation bilaterally, increased work of breathing with tachypnea, no accessory muscles, able to speak in full sentences Gastrointestinal: Soft, diffusely tender to palpation, nondistended MSK: No edema or ecchymosis Derm: No lesions Neuro: No focal deficits Psych: Reported good mood, normal affect  Labs and Imaging: CBC BMET  Recent Labs  Lab 10/23/19 2300  WBC 7.4  HGB 9.9*  HCT 35.9*  PLT 444*   Recent Labs  Lab 10/23/19 2300  NA 133*  K 4.1  CL 100  CO2 23  BUN 14  CREATININE 1.04*  GLUCOSE 167*  CALCIUM 8.5*     EKG: Sinus tachycardia Anterolateral infarct, age indeterminate Prolonged QT interval Baseline wander in lead(s) I III aVL No significant change was found Confirmed by Ezequiel Essex [phone]) on 10/23/2019 10:53:13 PM  DG Chest Port 1 View  Result Date: 10/23/2019 CLINICAL DATA:  Initial evaluation for acute shortness of breath. EXAM: PORTABLE CHEST 1 VIEW COMPARISON:  Prior radiograph from 10/04/2019. FINDINGS: Left-sided pacemaker/AICD  in place, stable. Cardiomegaly, unchanged. Mediastinal silhouette within normal limits. Lung volumes are reduced. Extensive chronic coarsening of the interstitial markings with fibrotic changes, relatively unchanged. No definite superimposed focal airspace disease. No overt pulmonary edema. No pleural  effusion. No pneumothorax. Osseous structures unchanged. IMPRESSION: 1. Stable appearance of the chest with extensive chronic coarsening of the interstitial markings and fibrotic lung changes. No definite superimposed active cardiopulmonary process identified. 2. Stable cardiomegaly without overt pulmonary edema. Electronically Signed   By: Jeannine Boga M.D.   On: 10/23/2019 23:07    Gladys Damme, MD 10/24/2019, 12:41 AM PGY-1, [facility] pager: [phone], text pages welcome  Attestation I have separately seen and examined the patient.  I have discussed the findings and exam with Dr. Chauncey Reading and agree with the above note. My changes are highlighted in blue.    Addison Naegeli, MD PGY-2 [facility] residency program

## 2019-10-24 NOTE — ED Notes (Signed)
Breakfast ordered 

## 2019-10-24 NOTE — ED Notes (Signed)
Pt's dinner tray arrived, brought tray into pt's room.

## 2019-10-24 NOTE — ED Notes (Signed)
ED TO INPATIENT HANDOFF REPORT  ED Nurse Name and Phone #: Marye Round [phone]  S Name/Age/Gender Makayla Vasquez 39 y.o. female Room/Bed: 007C/007C  Code Status   Code Status: Full Code  Home/SNF/Other Home Patient oriented to: self, place, time and situation Is this baseline? Yes   Triage Complete: Triage complete  Chief Complaint COVID-19 [U07.1]  Triage Note Pt transported from home by EMS with c/o shob, cough, pt was seen by MD for same, pt did not pick up meds prescribed from pharmacy yet, pt states she has not taken Zebeta for several days because she was not home, was told not to take Celcept and has not taken steroids today.  Pt c/o pain to legs and pain to chest with deep inspiration.     Allergies Allergies  Allergen Reactions  . 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 Sertraline Hcl Hives  . Tape Other (See Comments)    "Burns" the skin    Level of Care/Admitting Diagnosis ED Disposition    ED Disposition Condition [facility] Area: [place] [[id number]]  Level of Care: Telemetry Medical [104]  Covid Evaluation: Confirmed COVID Positive  Diagnosis: [id number] [[id number]]  Admitting Physician: Gladys Damme [[id number]]  Attending Physician: Zenia Resides [[id number]]  Estimated length of stay: 3 - 4 days  Certification:: I certify this patient will need inpatient services for at least 2 midnights       B Medical/Surgery History Past Medical History:  Diagnosis Date  . Anxiety   . Arthritis    rheumatoid arthritis- mild, no rheumatology care   . Asthma    seasonal allergies   . Asymptomatic LV dysfunction    a. Echo in Dec 2011 with EF 35 to 40%. Felt to be due to paced rhythm. b. EF 25-30% in 07/2014.  . Bipolar affective disorder ([place])   . Cardiac pacemaker    a. Since age 9 in 50. b. Upgrade to BiV in 2013.  [name] Carpal tunnel syndrome of  right wrist   . CHF (congestive heart failure) ([place])   . Congenital complete AV block   . Cor pulmonale, chronic ([place]) 04/08/2018  . Depression    bipolar  . Diabetes mellitus without complication ([place])   . GERD (gastroesophageal reflux disease)   . Hypertension   . Interstitial lung disease ([place])   . Lupus ([place])   . Lupus (systemic lupus erythematosus) ([place])   . Obesity   . Pneumonitis    a. a/w hypoxia - inflammatory - large workup 07/2014.  . Presence of permanent cardiac pacemaker   . Seizures ([place])    as a child- from high fever  . Sinus tachycardia    Past Surgical History:  Procedure Laterality Date  . ATRIAL TACH ABLATION N/A 08/14/2014   Procedure: ATRIAL TACH ABLATION;  Surgeon: [name], MD;  Location: [facility] CATH LAB;  Service: Cardiovascular;  Laterality: N/A;  . BI-VENTRICULAR PACEMAKER UPGRADE N/A 03/08/2012   Procedure: BI-VENTRICULAR PACEMAKER UPGRADE;  Surgeon: [name], MD;  Location: [facility] CATH LAB;  Service: Cardiovascular;  Laterality: N/A;  . CARPAL TUNNEL WITH CUBITAL TUNNEL Right 07/26/2013   Procedure: RIGHT LIMITED OPEN CARPAL TUNNEL RELEASE ,  RIGHT CUBITAL TUNNEL RELEASE, INSITU VERSES ULNAR NERVE DECOMPRESSION AND ANTERIOR TRANSPOSITION;  Surgeon: [name], MD;  Location: [place];  Service: Orthopedics;  Laterality: Right;  . CESAREAN SECTION    .  CHOLECYSTECTOMY    . CYST EXCISION  12/10/2012   THYROID  . INSERT / REPLACE / REMOVE PACEMAKER     2001  . IUD REMOVAL  11/03/2011   Procedure: INTRAUTERINE DEVICE (IUD) REMOVAL;  Surgeon: [name], MD;  Location: [facility];  Service: Gynecology;  Laterality: N/A;  . NASAL FRACTURE SURGERY     /w plate   . RIGHT HEART CATHETERIZATION N/A 10/26/2014   Procedure: RIGHT HEART CATH;  Surgeon: [name], MD;  Location: [facility] CATH LAB;  Service: Cardiovascular;  Laterality: N/A;  . THROAT SURGERY  1994   s/p laser treatment  . THYROGLOSSAL DUCT CYST N/A 12/10/2012   Procedure: REVISION OF  THYROGLOSSAL DUCT CYST EXCISION;  Surgeon: Makayla Gala, MD;  Location: [place];  Service: ENT;  Laterality: N/A;  Revision of Thyroglossal Duct Cyst Excision  . VIDEO BRONCHOSCOPY Bilateral 06/19/2014   Procedure: VIDEO BRONCHOSCOPY WITHOUT FLUORO;  Surgeon: Brand Males, MD;  Location: [place];  Service: Cardiopulmonary;  Laterality: Bilateral;     A IV Location/Drains/Wounds Patient Lines/Drains/Airways Status   Active Line/Drains/Airways    Name:   Placement date:   Placement time:   Site:   Days:   Peripheral IV 10/23/19 Left Antecubital   10/23/19    2250    Antecubital   1          Intake/Output Last 24 hours No intake or output data in the 24 hours ending 10/24/19 2236  Labs/Imaging Results for orders placed or performed during the hospital encounter of 10/23/19 (from the past 48 hour(s))  Lactic acid, plasma     Status: None   Collection Time: 10/23/19 11:00 PM  Result Value Ref Range   Lactic Acid, Venous 1.7 0.5 - 1.9 mmol/L    Comment: Performed at [facility] [address] [place], [place] [address]  Blood Culture (routine x 2)     Status: None (Preliminary result)   Collection Time: 10/23/19 11:00 PM   Specimen: BLOOD  Result Value Ref Range   Specimen Description BLOOD LEFT ANTECUBITAL    Special Requests      BOTTLES DRAWN AEROBIC AND ANAEROBIC Blood Culture adequate volume   Culture      NO GROWTH < 24 HOURS Performed at [facility], [place] [address], [place], [place] [address]    Report Status PENDING   CBC WITH DIFFERENTIAL     Status: Abnormal   Collection Time: 10/23/19 11:00 PM  Result Value Ref Range   WBC 7.4 4.0 - 10.5 K/uL   RBC 4.85 3.87 - 5.11 MIL/uL   Hemoglobin 9.9 (L) 12.0 - 15.0 g/dL   HCT 35.9 (L) 36.0 - 46.0 %   MCV 74.0 (L) 80.0 - 100.0 fL   MCH 20.4 (L) 26.0 - 34.0 pg   MCHC 27.6 (L) 30.0 - 36.0 g/dL   RDW 18.5 (H) 11.5 - 15.5 %   Platelets 444 (H) 150 - 400 K/uL   nRBC 0.0 0.0 - 0.2 %   Neutrophils Relative %  71 %   Neutro Abs 5.3 1.7 - 7.7 K/uL   Lymphocytes Relative 17 %   Lymphs Abs 1.3 0.7 - 4.0 K/uL   Monocytes Relative 7 %   Monocytes Absolute 0.5 0.1 - 1.0 K/uL   Eosinophils Relative 4 %   Eosinophils Absolute 0.3 0.0 - 0.5 K/uL   Basophils Relative 1 %   Basophils Absolute 0.1 0.0 - 0.1 K/uL   Immature Granulocytes 0 %  Abs Immature Granulocytes 0.03 0.00 - 0.07 K/uL    Comment: Performed at [facility], [place] [address] [place], [place] [address]  Comprehensive metabolic panel     Status: Abnormal   Collection Time: 10/23/19 11:00 PM  Result Value Ref Range   Sodium 133 (L) 135 - 145 mmol/L   Potassium 4.1 3.5 - 5.1 mmol/L   Chloride 100 98 - 111 mmol/L   CO2 23 22 - 32 mmol/L   Glucose, Bld 167 (H) 70 - 99 mg/dL   BUN 14 6 - 20 mg/dL   Creatinine, Ser 1.04 (H) 0.44 - 1.00 mg/dL   Calcium 8.5 (L) 8.9 - 10.3 mg/dL   Total Protein 7.6 6.5 - 8.1 g/dL   Albumin 3.4 (L) 3.5 - 5.0 g/dL   AST 19 15 - 41 U/L   ALT 11 0 - 44 U/L   Alkaline Phosphatase 63 38 - 126 U/L   Total Bilirubin 0.5 0.3 - 1.2 mg/dL   GFR calc non Af Amer >60 >60 mL/min   GFR calc Af Amer >60 >60 mL/min   Anion gap 10 5 - 15    Comment: Performed at [facility], [place] [address], [place], [place] [address]  D-dimer, quantitative     Status: Abnormal   Collection Time: 10/23/19 11:00 PM  Result Value Ref Range   D-Dimer, Quant 1.75 (H) 0.00 - 0.50 ug/mL-FEU    Comment: (NOTE) At the manufacturer cut-off of 0.50 ug/mL FEU, this assay has been documented to exclude PE with a sensitivity and negative predictive value of 97 to 99%.  At this time, this assay has not been approved by the FDA to exclude DVT/VTE. Results should be correlated with clinical presentation. Performed at [facility], [place] [address] [place], [place] [address]   Procalcitonin     Status: None   Collection Time: 10/23/19 11:00 PM  Result Value Ref Range   Procalcitonin <0.10 ng/mL    Comment:         Interpretation: PCT (Procalcitonin) <= 0.5 ng/mL: Systemic infection (sepsis) is not likely. Local bacterial infection is possible. (NOTE)       Sepsis PCT Algorithm           Lower Respiratory Tract                                      Infection PCT Algorithm    ----------------------------     ----------------------------         PCT < 0.25 ng/mL                PCT < 0.10 ng/mL         Strongly encourage             Strongly discourage   discontinuation of antibiotics    initiation of antibiotics    ----------------------------     -----------------------------       PCT 0.25 - 0.50 ng/mL            PCT 0.10 - 0.25 ng/mL               OR       >80% decrease in PCT            Discourage initiation of  antibiotics      Encourage discontinuation           of antibiotics    ----------------------------     -----------------------------         PCT >= 0.50 ng/mL              PCT 0.26 - 0.50 ng/mL               AND        <80% decrease in PCT             Encourage initiation of                                             antibiotics       Encourage continuation           of antibiotics    ----------------------------     -----------------------------        PCT >= 0.50 ng/mL                  PCT > 0.50 ng/mL               AND         increase in PCT                  Strongly encourage                                      initiation of antibiotics    Strongly encourage escalation           of antibiotics                                     -----------------------------                                           PCT <= 0.25 ng/mL                                                 OR                                        > 80% decrease in PCT                                     Discontinue / Do not initiate                                             antibiotics Performed at [facility], [place] [address], [place], [place] [address]    Lactate dehydrogenase     Status: Abnormal   Collection Time: 10/23/19  11:00 PM  Result Value Ref Range   LDH 405 (H) 98 - 192 U/L    Comment: Performed at [facility] [address], [place], [place] [address]  Ferritin     Status: None   Collection Time: 10/23/19 11:00 PM  Result Value Ref Range   Ferritin 19 11 - 307 ng/mL    Comment: Performed at [facility] [address] [place], [place] [address]  Triglycerides     Status: Abnormal   Collection Time: 10/23/19 11:00 PM  Result Value Ref Range   Triglycerides 153 (H) <150 mg/dL    Comment: Performed at [facility] [address], [place], [place] [address]  Fibrinogen     Status: Abnormal   Collection Time: 10/23/19 11:00 PM  Result Value Ref Range   Fibrinogen 585 (H) 210 - 475 mg/dL    Comment: Performed at [facility] [address], [place], [place] [address]  C-reactive protein     Status: Abnormal   Collection Time: 10/23/19 11:00 PM  Result Value Ref Range   CRP 1.8 (H) <1.0 mg/dL    Comment: Performed at [facility], [place] [address], [place], [place] [address]  Brain natriuretic peptide     Status: Abnormal   Collection Time: 10/23/19 11:00 PM  Result Value Ref Range   B Natriuretic Peptide 133.7 (H) 0.0 - 100.0 pg/mL    Comment: Performed at [facility] [address], [place], [place] [address]  I-Stat beta hCG blood, ED     Status: None   Collection Time: 10/23/19 11:23 PM  Result Value Ref Range   I-stat hCG, quantitative <5.0 <5 mIU/mL   Comment 3            Comment:   GEST. AGE      CONC.  (mIU/mL)   <=1 WEEK        5 - 50     2 WEEKS       50 - 500     3 WEEKS       100 - 10,000     4 WEEKS     1,000 - 30,000        FEMALE AND NON-PREGNANT FEMALE:     LESS THAN 5 mIU/mL   POC SARS Coronavirus 2 Ag-ED - Nasal Swab (BD Veritor Kit)     Status: Abnormal   Collection Time: 10/23/19 11:31 PM  Result Value Ref Range   SARS Coronavirus 2 Ag POSITIVE (A)  NEGATIVE    Comment: (NOTE) SARS-CoV-2 antigen PRESENT. Positive results indicate the presence of viral antigens, but clinical correlation with patient history and other diagnostic information is necessary to determine patient infection status.  Positive results do not rule out bacterial infection or co-infection  with other viruses. False positive results are rare but can occur, and confirmatory RT-PCR testing may be appropriate in some circumstances. The expected result is Negative. Fact Sheet for Patients: PodPark.tn Fact Sheet for Providers: GiftContent.is  This test is not yet approved or cleared by the [place] FDA and  has been authorized for detection and/or diagnosis of SARS-CoV-2 by FDA under an Emergency Use Authorization (EUA).  This EUA will remain in effect (meaning this test can be used) for the duration of  the COVID-19 declaration under Section 564(b)(1) of the Act, 21 U.S.C. section 360bbb-3(b)(1), unless the a uthorization is terminated or revoked sooner.   CBG monitoring, ED  Status: Abnormal   Collection Time: 10/24/19  4:47 AM  Result Value Ref Range   Glucose-Capillary 182 (H) 70 - 99 mg/dL  Lactic acid, plasma     Status: None   Collection Time: 10/24/19  4:52 AM  Result Value Ref Range   Lactic Acid, Venous 1.1 0.5 - 1.9 mmol/L    Comment: Performed at [facility] [address] [place], [place] [address]  Blood Culture (routine x 2)     Status: None (Preliminary result)   Collection Time: 10/24/19  4:52 AM   Specimen: BLOOD RIGHT WRIST  Result Value Ref Range   Specimen Description BLOOD RIGHT WRIST    Special Requests      BOTTLES DRAWN AEROBIC AND ANAEROBIC Blood Culture adequate volume Performed at [facility], [place] [address], [place], [place] [address]    Culture PENDING    Report Status PENDING   CBC     Status: Abnormal   Collection Time: 10/24/19  4:52 AM   Result Value Ref Range   WBC 7.3 4.0 - 10.5 K/uL   RBC 4.32 3.87 - 5.11 MIL/uL   Hemoglobin 8.7 (L) 12.0 - 15.0 g/dL    Comment: Reticulocyte Hemoglobin testing may be clinically indicated, consider ordering this additional test [id number]    HCT 32.5 (L) 36.0 - 46.0 %   MCV 75.2 (L) 80.0 - 100.0 fL   MCH 20.1 (L) 26.0 - 34.0 pg   MCHC 26.8 (L) 30.0 - 36.0 g/dL   RDW 18.5 (H) 11.5 - 15.5 %   Platelets 378 150 - 400 K/uL   nRBC 0.0 0.0 - 0.2 %    Comment: Performed at [facility], [place] [address], [place], [place] [address]  Creatinine, serum     Status: None   Collection Time: 10/24/19  4:52 AM  Result Value Ref Range   Creatinine, Ser 0.90 0.44 - 1.00 mg/dL   GFR calc non Af Amer >60 >60 mL/min   GFR calc Af Amer >60 >60 mL/min    Comment: Performed at [facility] [address], [place],  [address]  Lipid panel     Status: Abnormal   Collection Time: 10/24/19  4:52 AM  Result Value Ref Range   Cholesterol 156 0 - 200 mg/dL   Triglycerides 112 <150 mg/dL   HDL 26 (L) >40 mg/dL   Total CHOL/HDL Ratio 6.0 RATIO   VLDL 22 0 - 40 mg/dL   LDL Cholesterol 108 (H) 0 - 99 mg/dL    Comment:        Total Cholesterol/HDL:CHD Risk Coronary Heart Disease Risk Table                     Men   Women  1/2 Average Risk   3.4   3.3  Average Risk       5.0   4.4  2 X Average Risk   9.6   7.1  3 X Average Risk  23.4   11.0        Use the calculated Patient Ratio above and the CHD Risk Table to determine the patient's CHD Risk.        ATP III CLASSIFICATION (LDL):  <100     mg/dL   Optimal  100-129  mg/dL   Near or Above                    Optimal  130-159  mg/dL   Borderline  160-189  mg/dL   High  >190     mg/dL   Very High Performed at [facility] [address], [place], [place] [address]   CBG monitoring, ED     Status: Abnormal   Collection Time: 10/24/19 12:06 PM  Result Value Ref Range   Glucose-Capillary 286 (H) 70 - 99 mg/dL  CBG  monitoring, ED     Status: Abnormal   Collection Time: 10/24/19  4:34 PM  Result Value Ref Range   Glucose-Capillary 168 (H) 70 - 99 mg/dL   Comment 1 Document in Chart   Urinalysis, Routine w reflex microscopic     Status: Abnormal   Collection Time: 10/24/19  4:44 PM  Result Value Ref Range   Color, Urine STRAW (A) YELLOW   APPearance HAZY (A) CLEAR   Specific Gravity, Urine 1.006 1.005 - 1.030   pH 6.0 5.0 - 8.0   Glucose, UA NEGATIVE NEGATIVE mg/dL   Hgb urine dipstick LARGE (A) NEGATIVE   Bilirubin Urine NEGATIVE NEGATIVE   Ketones, ur NEGATIVE NEGATIVE mg/dL   Protein, ur NEGATIVE NEGATIVE mg/dL   Nitrite NEGATIVE NEGATIVE   Leukocytes,Ua LARGE (A) NEGATIVE   RBC / HPF >50 (H) 0 - 5 RBC/hpf   WBC, UA 0-5 0 - 5 WBC/hpf   Bacteria, UA FEW (A) NONE SEEN   Squamous Epithelial / LPF 0-5 0 - 5   Mucus PRESENT     Comment: Performed at [facility], [address] [place], [place] [address]  CBG monitoring, ED     Status: Abnormal   Collection Time: 10/24/19  8:35 PM  Result Value Ref Range   Glucose-Capillary 173 (H) 70 - 99 mg/dL   Comment 1 Document in Chart    *Note: Due to a large number of results and/or encounters for the requested time period, some results have not been displayed. A complete set of results can be found in Results Review.   DG Chest Port 1 View  Result Date: 10/23/2019 CLINICAL DATA:  Initial evaluation for acute shortness of breath. EXAM: PORTABLE CHEST 1 VIEW COMPARISON:  Prior radiograph from 10/04/2019. FINDINGS: Left-sided pacemaker/AICD in place, stable. Cardiomegaly, unchanged. Mediastinal silhouette within normal limits. Lung volumes are reduced. Extensive chronic coarsening of the interstitial markings with fibrotic changes, relatively unchanged. No definite superimposed focal airspace disease. No overt pulmonary edema. No pleural effusion. No pneumothorax. Osseous structures unchanged. IMPRESSION: 1. Stable appearance of the chest with  extensive chronic coarsening of the interstitial markings and fibrotic lung changes. No definite superimposed active cardiopulmonary process identified. 2. Stable cardiomegaly without overt pulmonary edema. Electronically Signed   By: Jeannine Boga M.D.   On: 10/23/2019 23:07    Pending Labs Unresulted Labs (From admission, onward)    Start     Ordered   10/31/19 0500  Creatinine, serum  (enoxaparin (LOVENOX)    CrCl >/= 30 ml/min)  Weekly,   R    Comments: while on enoxaparin therapy    10/24/19 0256   10/25/19 0500  CBC with Differential/Platelet  Daily,   R     10/24/19 0256   10/25/19 0500  Comprehensive metabolic panel  Daily,   R     10/24/19 0256   10/25/19 0500  C-reactive protein  Daily,   R     10/24/19 0256          Vitals/Pain Today's Vitals   10/24/19 2037 10/24/19 2100 10/24/19 2122 10/24/19  2130  BP:  92/75  96/76  Pulse:  95  95  Resp:  (!) 38  (!) 27  Temp:      TempSrc:      SpO2:  100%  99%  Weight:      Height:      PainSc: 0-No pain  0-No pain     Isolation Precautions Airborne and Contact precautions  Medications Medications  remdesivir 200 mg in sodium chloride 0.9% 250 mL IVPB (0 mg Intravenous Stopped 10/24/19 0344)    Followed by  remdesivir 100 mg in sodium chloride 0.9 % 100 mL IVPB (has no administration in time range)  bisoprolol (ZEBETA) tablet 5 mg (5 mg Oral Given 10/24/19 1022)  traZODone (DESYREL) tablet 50 mg (has no administration in time range)  pantoprazole (PROTONIX) EC tablet 40 mg (40 mg Oral Given 10/24/19 1020)  ferrous sulfate tablet 325 mg (325 mg Oral Given 10/24/19 0839)  tacrolimus (PROGRAF) capsule 1.5 mg (1.5 mg Oral Given 10/24/19 1019)  albuterol (VENTOLIN HFA) 108 (90 Base) MCG/ACT inhaler 2 puff (has no administration in time range)  fluticasone furoate-vilanterol (BREO ELLIPTA) 100-25 MCG/INH 1 puff (1 puff Inhalation Not Given 10/24/19 0855)  loratadine (CLARITIN) tablet 10 mg (10 mg Oral Given 10/24/19 1020)   enoxaparin (LOVENOX) injection 40 mg (40 mg Subcutaneous Given 10/24/19 1020)  dexamethasone (DECADRON) tablet 6 mg (has no administration in time range)  insulin aspart (novoLOG) injection 0-9 Units (2 Units Subcutaneous Given 10/24/19 2122)  acetaminophen (TYLENOL) tablet 650 mg (has no administration in time range)  polyethylene glycol (MIRALAX / GLYCOLAX) packet 17 g (has no administration in time range)  acetaminophen (TYLENOL) tablet 1,000 mg (1,000 mg Oral Given 10/24/19 0009)  sodium chloride 0.9 % bolus 500 mL (0 mLs Intravenous Stopped 10/24/19 0554)  dexamethasone (DECADRON) injection 6 mg (6 mg Intravenous Given 10/24/19 0203)  furosemide (LASIX) injection 40 mg (40 mg Intravenous Given 10/24/19 1836)    Mobility walks     Focused Assessments Pulmonary Assessment Handoff:  Lung sounds:   O2 Device: Nasal Cannula O2 Flow Rate (L/min): 4 L/min      R Recommendations: See Admitting Provider Note  Report given to:   Additional Notes:

## 2019-10-24 NOTE — ED Provider Notes (Signed)
Patient seen/examined in the Emergency Department in conjunction with Midlevel Provider [facility] Patient reports increased cough and shortness of breath for the past 2 days Exam : Alert, appears mildly tachypneic and tachycardic Plan: patient will be admitted for COVID-19.  Patient is high risk due to underlying health conditions   Zadie Rhine, MD 10/24/19 [id number]

## 2019-10-25 ENCOUNTER — Encounter (HOSPITAL_COMMUNITY): Payer: Self-pay | Admitting: Family Medicine

## 2019-10-25 LAB — COMPREHENSIVE METABOLIC PANEL
ALT: 11 U/L (ref 0–44)
AST: 17 U/L (ref 15–41)
Albumin: 2.9 g/dL — ABNORMAL LOW (ref 3.5–5.0)
Alkaline Phosphatase: 60 U/L (ref 38–126)
Anion gap: 10 (ref 5–15)
BUN: 24 mg/dL — ABNORMAL HIGH (ref 6–20)
CO2: 25 mmol/L (ref 22–32)
Calcium: 8.1 mg/dL — ABNORMAL LOW (ref 8.9–10.3)
Chloride: 101 mmol/L (ref 98–111)
Creatinine, Ser: 1.02 mg/dL — ABNORMAL HIGH (ref 0.44–1.00)
GFR calc Af Amer: >60 mL/min (ref >60)
GFR calc non Af Amer: >60 mL/min (ref >60)
Glucose, Bld: 285 mg/dL — ABNORMAL HIGH (ref 70–99)
Potassium: 4 mmol/L (ref 3.5–5.1)
Sodium: 136 mmol/L (ref 135–145)
Total Bilirubin: 0.7 mg/dL (ref 0.3–1.2)
Total Protein: 6.8 g/dL (ref 6.5–8.1)

## 2019-10-25 LAB — CBC WITH DIFFERENTIAL/PLATELET
Abs Immature Granulocytes: 0.04 10*3/uL (ref 0.00–0.07)
Basophils Absolute: 0 10*3/uL (ref 0.0–0.1)
Basophils Relative: 0 %
Eosinophils Absolute: 0 10*3/uL (ref 0.0–0.5)
Eosinophils Relative: 0 %
HCT: 30 % — ABNORMAL LOW (ref 36.0–46.0)
Hemoglobin: 8.5 g/dL — ABNORMAL LOW (ref 12.0–15.0)
Immature Granulocytes: 0 %
Lymphocytes Relative: 10 %
Lymphs Abs: 1 10*3/uL (ref 0.7–4.0)
MCH: 20.5 pg — ABNORMAL LOW (ref 26.0–34.0)
MCHC: 28.3 g/dL — ABNORMAL LOW (ref 30.0–36.0)
MCV: 72.5 fL — ABNORMAL LOW (ref 80.0–100.0)
Monocytes Absolute: 0.3 10*3/uL (ref 0.1–1.0)
Monocytes Relative: 3 %
Neutro Abs: 8.5 10*3/uL — ABNORMAL HIGH (ref 1.7–7.7)
Neutrophils Relative %: 87 %
Platelets: 362 10*3/uL (ref 150–400)
RBC: 4.14 MIL/uL (ref 3.87–5.11)
RDW: 18.4 % — ABNORMAL HIGH (ref 11.5–15.5)
WBC: 9.8 10*3/uL (ref 4.0–10.5)
nRBC: 0 % (ref 0.0–0.2)

## 2019-10-25 LAB — BLOOD GAS, ARTERIAL
Acid-Base Excess: 1.9 mmol/L (ref 0.0–2.0)
Bicarbonate: 25.8 mmol/L (ref 20.0–28.0)
Drawn by: 519031
FIO2: 36
O2 Saturation: 98.6 %
Patient temperature: 36.5
pCO2 arterial: 38.5 mmHg (ref 32.0–48.0)
pH, Arterial: 7.438 (ref 7.350–7.450)
pO2, Arterial: 108 mmHg (ref 83.0–108.0)

## 2019-10-25 LAB — GLUCOSE, CAPILLARY
Glucose-Capillary: 157 mg/dL — ABNORMAL HIGH (ref 70–99)
Glucose-Capillary: 268 mg/dL — ABNORMAL HIGH (ref 70–99)
Glucose-Capillary: 261 mg/dL — ABNORMAL HIGH (ref 70–99)
Glucose-Capillary: 254 mg/dL — ABNORMAL HIGH (ref 70–99)
Glucose-Capillary: 153 mg/dL — ABNORMAL HIGH (ref 70–99)
Glucose-Capillary: 198 mg/dL — ABNORMAL HIGH (ref 70–99)

## 2019-10-25 LAB — C-REACTIVE PROTEIN: CRP: 2.8 mg/dL — ABNORMAL HIGH (ref <1.0)

## 2019-10-25 IMAGING — CR DG CHEST 2V
2 series · 2 of 2 positions shown · non-contrast
Comparison: Chest x-ray of August 20, 2017

CLINICAL DATA: Flu-like symptoms for the past week. Intermittently
productive cough. History of asthma, CHF, former smoker.

EXAM:
CHEST  2 VIEW

[w chest lat]
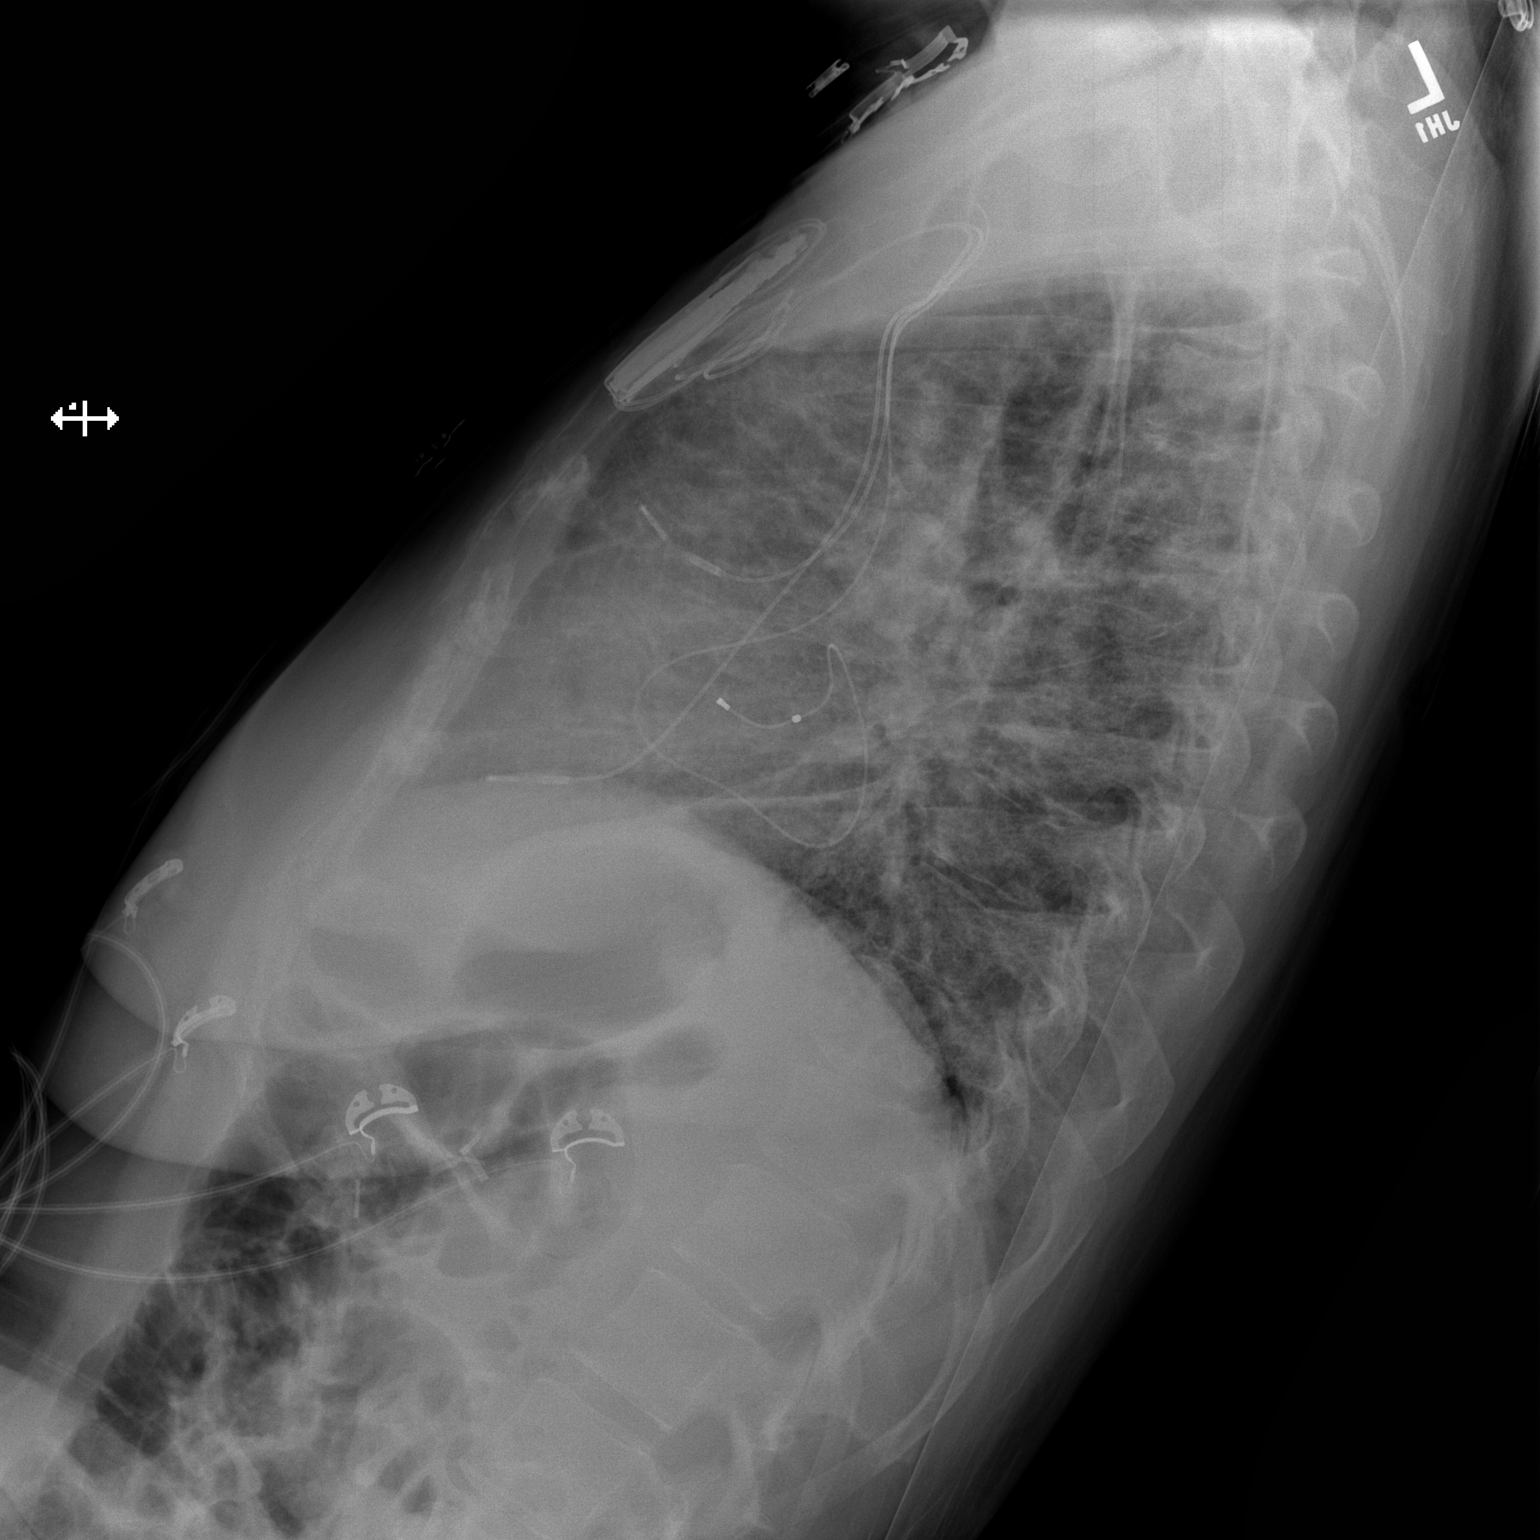

[x chest ap]
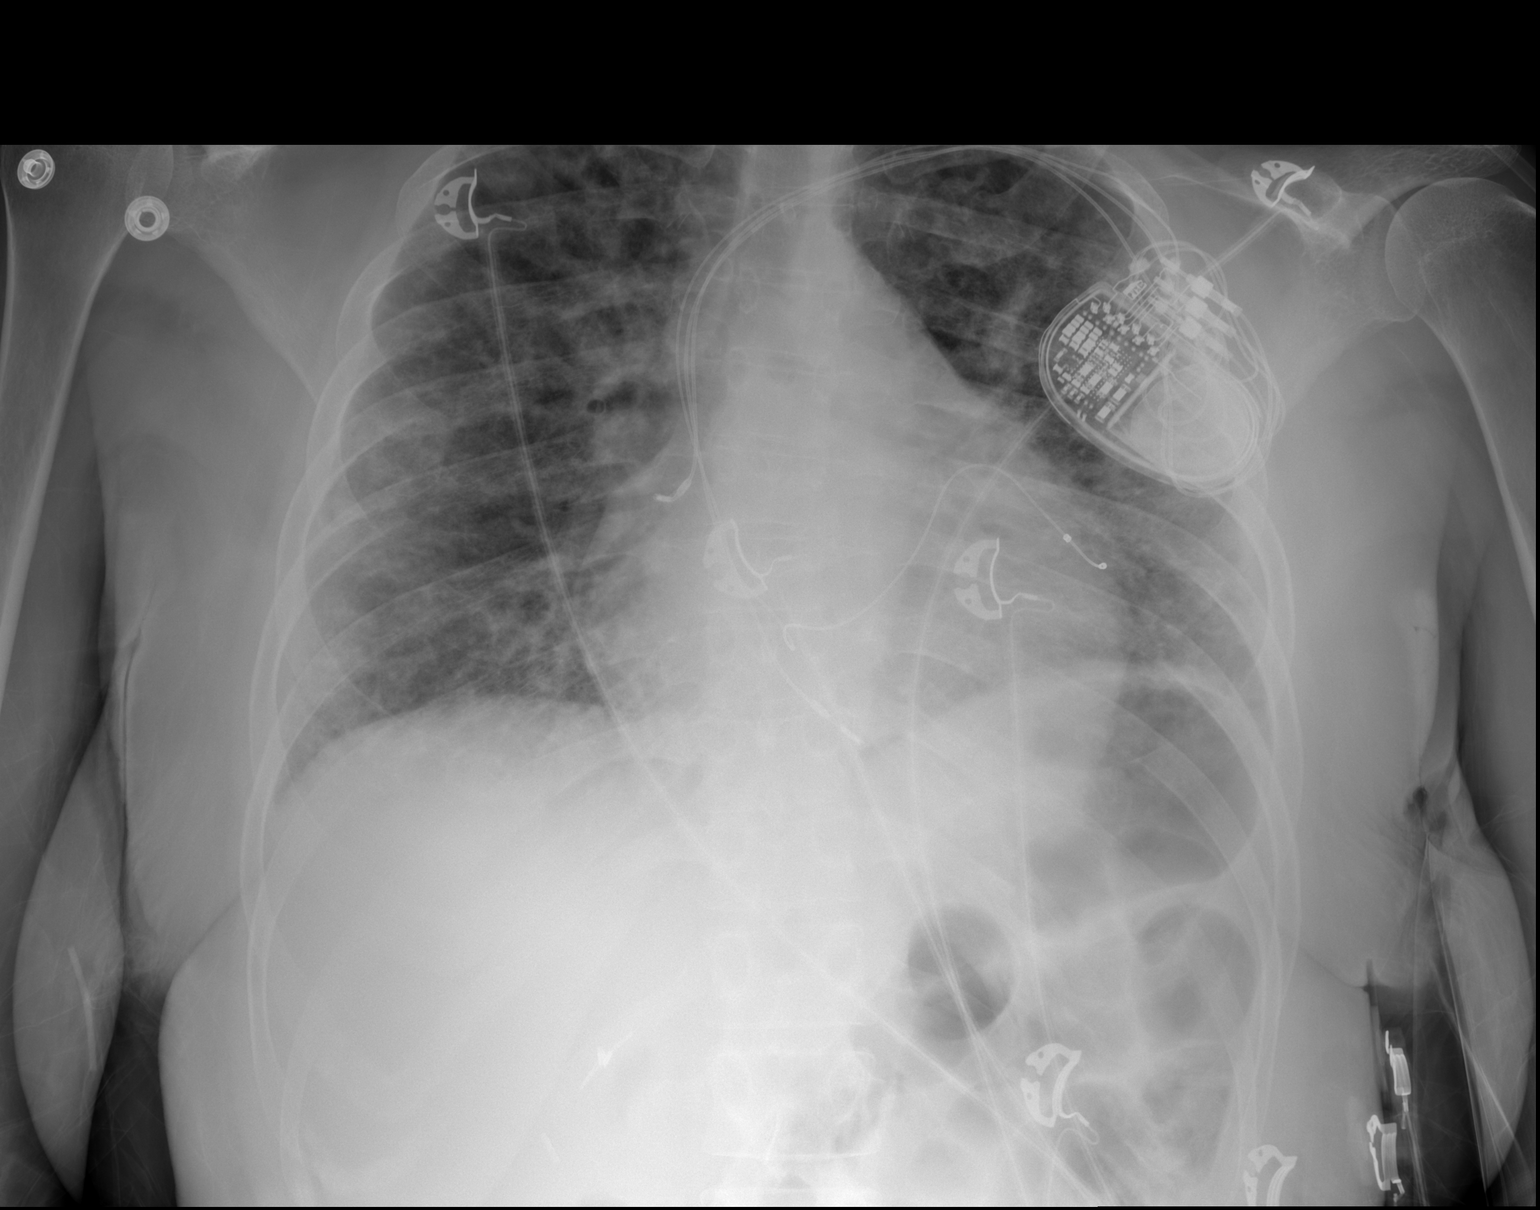

[2 of 2 positions shown; findings below may reference images not displayed]

FINDINGS: The lungs are adequately inflated. The interstitial markings remain
increased. There is no alveolar infiltrate or pleural effusion. The
cardiac silhouette is top-normal in size but stable. The pulmonary
vascularity is in boards centrally. The ICD is in stable position.
There is no acute bony abnormality.
IMPRESSION: Chronic pulmonary fibrotic changes. Possible superimposed acute
bronchitis but no focal pneumonia. Low-grade compensated CHF is
suspected.

## 2019-10-25 IMAGING — DX DG ABDOMEN 1V
1 series · 1 of 1 positions shown · non-contrast
Comparison: CT 01/04/2017.  Abdomen 09/24/2016 .

CLINICAL DATA: Diarrhea.  Abdominal pain .

EXAM:
ABDOMEN - 1 VIEW

[abdomen kub]
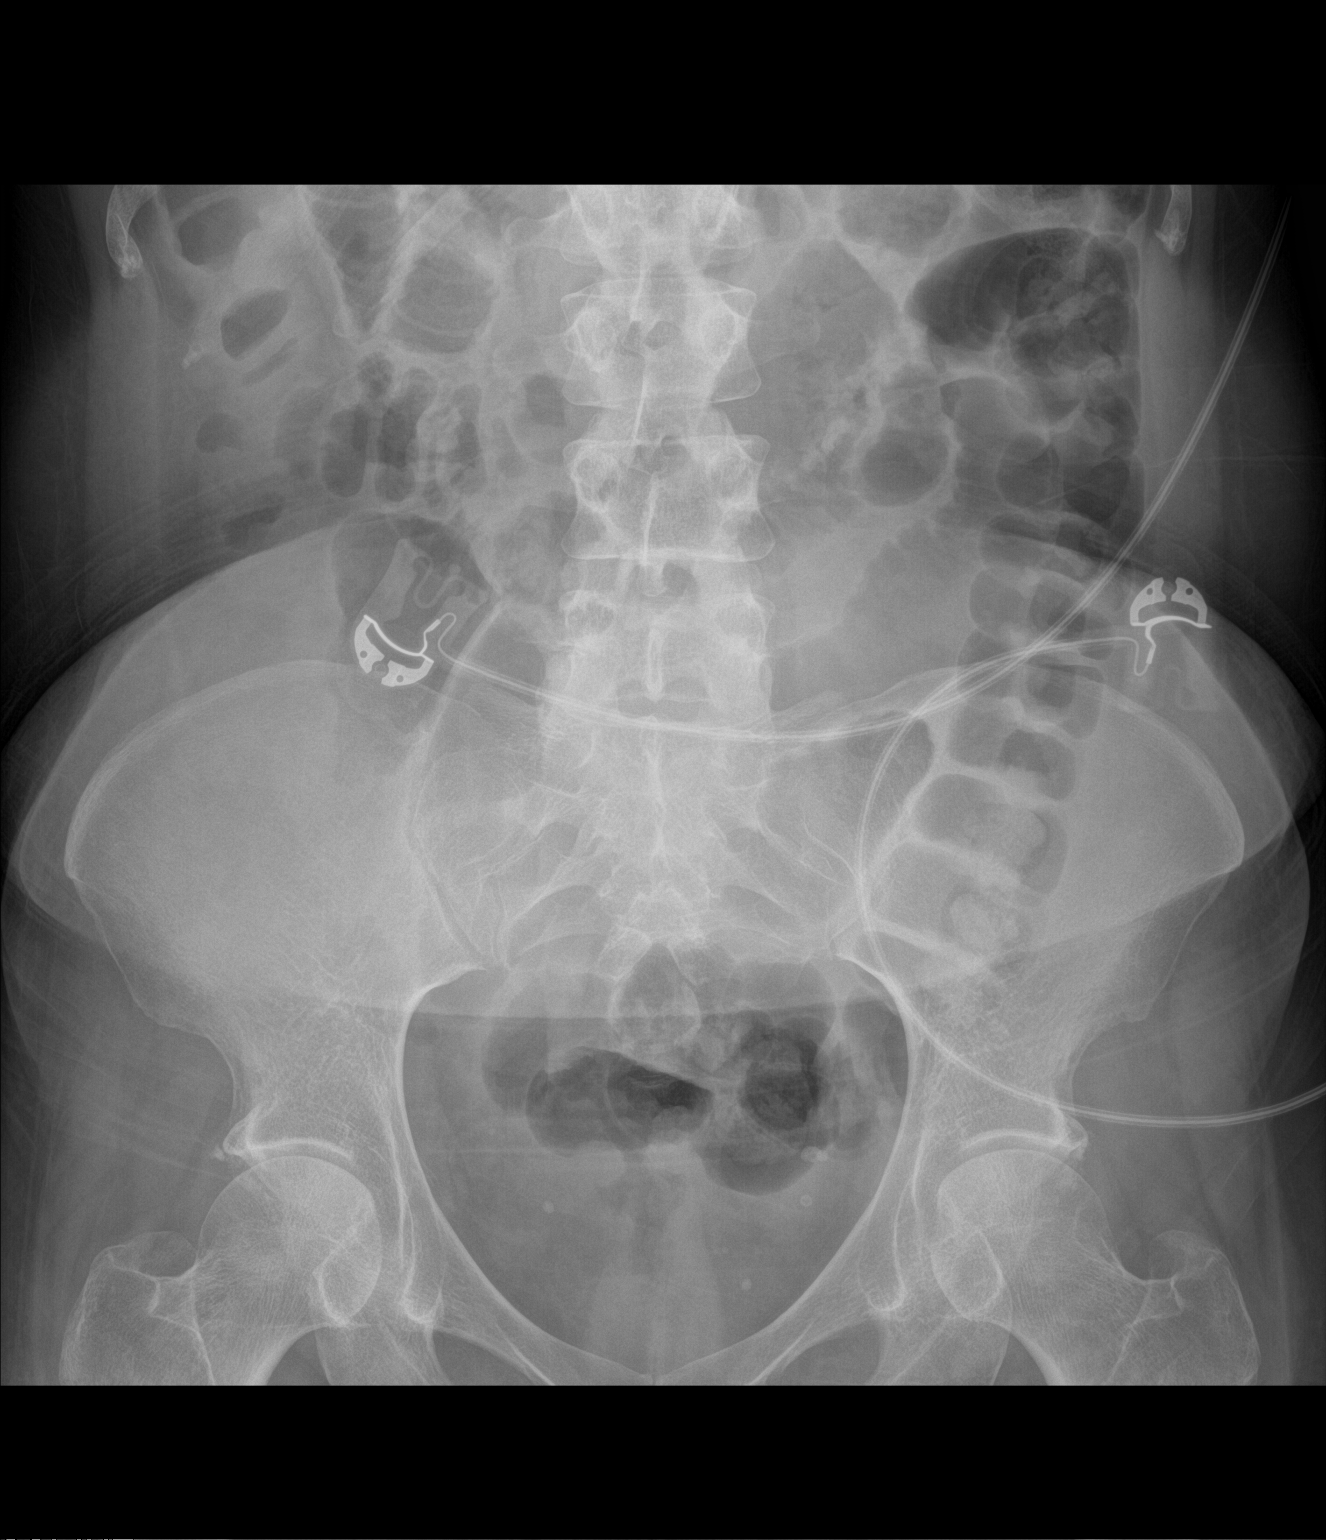

[1 of 1 positions shown; findings below may reference images not displayed]

FINDINGS: Soft tissue structures are unremarkable. Dilated loops of small
bowel noted. Air-filled colon is noted. Differential diagnosis
includes adynamic ileus and bowel obstruction, possibly partial
small bowel obstruction. Follow-up exam suggested to demonstrate
resolution. No free air identified. Pelvic calcifications most
likely phleboliths. No acute bony abnormality.
IMPRESSION: Dilated loops of small bowel noted. Air-filled nondilated colon
noted. Differential diagnosis includes adynamic ileus and bowel
obstruction, possibly partial small-bowel obstruction. Follow-up
exam suggested to demonstrate resolution.

## 2019-10-25 MED ORDER — INSULIN GLARGINE 100 UNIT/ML ~~LOC~~ SOLN
10.0000 [IU] | Freq: Every day | SUBCUTANEOUS | Status: DC
  Administered 2019-10-25: 10 [IU] via SUBCUTANEOUS
  Filled 2019-10-25 (×2): qty 0.1

## 2019-10-25 MED ORDER — INSULIN GLARGINE 100 UNIT/ML ~~LOC~~ SOLN
10.0000 [IU] | Freq: Every day | SUBCUTANEOUS | Status: DC
  Filled 2019-10-25: qty 0.1

## 2019-10-25 NOTE — Discharge Summary (Addendum)
[facility] Discharge Summary  Patient name: Makayla Vasquez Medical record number: [id number] Date of birth: 1980-12-31 Age: 39 y.o. Gender: female Date of Admission: 10/23/2019  Date of Discharge: 11/02/19 Admitting Physician: Gladys Damme, MD  Primary Care Provider: Nuala Alpha, DO Consultants: None  Indication for Hospitalization: Fatigue, COVID-19 fraction  Discharge Diagnoses/Problem List:  Active Problems:   Acute on chronic HFrEF (heart failure with reduced ejection fraction) ([place])   Diabetes mellitus type 2 in obese ([place])   Hyperlipidemia associated with type 2 diabetes mellitus ([place])   Lupus ([place])   COVID-19   Chronic obstructive pulmonary disease ([place])   Bacteremia due to methicillin susceptible Staphylococcus aureus (MSSA)  Disposition: discharge   Discharge Condition: stable, improved  Discharge Exam:  General: Alert and cooperative and sitting up in bed in NAD watching TV  Cardio: Normal S1 and S2, no S3 or S4. Rhythm is regular. No murmurs or rubs.   Pulm: decreased lung sounds bilaterally and throughout, no coughing during today's exam,  diminished breath sounds, very fine crackles, Normal respiratory effort, on [place] Abdomen: Bowel sounds normal. Abdomen soft and non-tender.  Extremities: No peripheral edema. Warm/ well perfused.  Neuro: alert and oriented x3   Brief Hospital Course:   Fatigue and diminished appetite likely secondary to COVID-19 infection Patient presented to the emergency department with extreme fatigue and diminished appetite.  She also complained of some worsening shortness of breath compared to her baseline.  In the emergency department she was found to be Covid positive.  She was started on remdesivir for 5 days as well as Decadron 6 mg p.o. daily for 10 days to be completed on 11/02/19.  Her home prednisone was held during this time.  Patient's main symptoms with COVID-19 seem to be confined to generalized  malaise as well as some mild abdominal tenderness and diarrhea.  During the hospitalization the patient did not experience respiratory distress as a symptom of her COVID-19 infection.  Patient continued throughout the hospitalization on her home dose of oxygen, 2-4 L. At the time of discharge, patient was on 3 liters via [place] with oxygen saturations  Lupus  Anti-Synthetase Syndrome (anti-Jo-1 antibody, interstitial lung disease, myositis, and elevated CK) Patient follows with Dr. Dagoberto Ligas at the [facility].  Her rheumatologist was consulted during her hospitalization and recommended holding off on her CellCept and took Tacrolimus.  Recommended continuing her prednisone at 10 mg upon discharge. Patient planned to follow-up outpatient with her rheumatologist within the next week after discharge.  HFrEF During the hospitalization there was a question as to whether the patient was fluid as her previous weights in chart review seem to be approximately 15-20 pounds lower than her admitting weight.  She was initially increased from her home Lasix to 40 mg IV Lasix twice daily.  After this patient was noticed to be tachycardic and this was held for 1 day to monitor her fluid status more closely.  At this time the patient's weight appears to be closer to her home weight of around 141 pounds.  Patient was started on torsemide 20 mg/day.  MSSA Bacteremia During the course of the hospitalization patient did have one of her 4 blood culture showed positive for staph aureus.  Patient was worked up from this with the help of infectious disease with a TTE and a TEE. TEE showed no valvular vegetations.  Patient was started IV Cefazolin and Rifampin. She was continued on Cefazolin and Rifampin for an additional 3  weeks as outpatient until 11/22/19 and scheduled for follow up with ID on 11/30/19. Patient and patient's sister agreed to assist the patient with administration of abx on days that home health is not available  to help with IV medication administration.   COVID Ms. Hai was treated with Decadron for a 10 day course as well as 5 day course of remdesivir. Patient remained on supplemental oxygen throughout this admission and was stable on 3 liters via [place] at the time of discharge with oxygen saturations 99%. Given history of immunosuppression, it was recommended that patient has a prolonged period of isolation for 20 days since the onset of illness. Patient was instructed on this.   Issues for Follow Up:  1. Recommend PCP follow-up on urinalysis which showed hematuria. Recommend U/A when patient completes isolation. 2. Will have patient hold meal time insulin coverage at time of discharge and re-start Metformin following discharge. Please check BG at home for follow up virtual appt.  3. Patient advised to isolate at home and wear mask when other individuals in the home are around her, patient should continue to self- isolate until 11/10/19.   Significant Procedures:  TEE on 10/30/2018  Significant Labs and Imaging:  Recent Labs  Lab 11/01/19 0405 11/01/19 1103 11/02/19 0500  WBC 6.4 6.2 6.6  HGB 7.9* 8.2* 8.0*  HCT 29.8* 31.1* 29.6*  PLT 360 365 327   Recent Labs  Lab 10/27/19 0347 10/28/19 0500 10/29/19 0422 10/30/19 0401 10/31/19 0344 11/01/19 0405 11/02/19 0500  NA 137 134* 137 134* 138 137 136  K 3.4* 4.1 3.9 3.2* 3.5 3.9 3.8  CL 99 99 95* 95* 97* 97* 94*  CO2 26 25 29 27 31 30 31   GLUCOSE 288* 206* 151* 137* 138* 205* 92  BUN 21* 20 21* 15 20 19 15   CREATININE 1.14* 0.90 1.00 0.85 0.94 0.85 0.85  CALCIUM 8.0* 8.0* 8.3* 8.4* 8.6* 8.7* 8.5*  ALKPHOS 56 60 66  --   --   --   --   AST 16 15 21   --   --   --   --   ALT 9 8 9   --   --   --   --   ALBUMIN 3.0* 2.8* 3.3*  --   --   --   --     Results/Tests Pending at Time of Discharge: none  Discharge Medications:  Allergies as of 11/02/2019       Reactions   Atorvastatin Other (See Comments)   Possible statin-induced myopathy  with elevated CK if on Lipitor 40 mg   Sertraline Hcl Hives   Tape Other (See Comments)   "Burns" the skin        Medication List     STOP taking these medications    furosemide 40 MG tablet Commonly known as: LASIX   mycophenolate 500 MG tablet Commonly known as: CELLCEPT   NovoLOG FlexPen 100 UNIT/ML FlexPen Generic drug: insulin aspart   tacrolimus 0.5 MG capsule Commonly known as: PROGRAF   Trelegy Ellipta 100-62.5-25 MCG/INH Aepb Generic drug: Fluticasone-Umeclidin-Vilant       TAKE these medications    Accu-Chek Aviva Soln 1 application by In Vitro route 4 (four) times daily.   albuterol 108 (90 Base) MCG/ACT inhaler Commonly known as: VENTOLIN HFA Inhale 2 puffs into the lungs every 4 (four) hours as needed for wheezing or shortness of breath.   B-D SINGLE USE SWABS REGULAR Pads 1 application by Does not apply route  4 (four) times daily.   benzonatate 100 MG capsule Commonly known as: Tessalon Perles Take 1 capsule (100 mg total) by mouth 3 (three) times daily as needed for cough.   bisoprolol 5 MG tablet Commonly known as: ZEBETA Take 1 tablet (5 mg total) by mouth daily.   blood glucose meter kit and supplies Dispense based on patient and insurance preference. Use up to four times daily as directed. (FOR ICD-10 E10.9, E11.9).   Breo Ellipta 100-25 MCG/INH Aepb Generic drug: fluticasone furoate-vilanterol Inhale 1 puff into the lungs daily.   ceFAZolin  IVPB Commonly known as: ANCEF Inject 2 g into the vein every 8 (eight) hours for 21 days. Indication:  MSSA bacteremia Last Day of Therapy:  11/22/19 Labs - Once weekly:  CBC/D and BMP, Labs - Every other week:  ESR and CRP   cromolyn 5.2 MG/ACT nasal spray Commonly known as: NasalCrom Place 1 spray into both nostrils 4 (four) times daily.   dextromethorphan-guaiFENesin 10-100 MG/5ML liquid Commonly known as: ROBITUSSIN-DM TAKE 5 MLS BY MOUTH EVERY 4 (FOUR) HOURS AS NEEDED FOR COUGH. What  changed: See the new instructions.   ferrous sulfate 325 (65 FE) MG EC tablet Take 1 tablet (325 mg total) by mouth daily with breakfast.   Lantus SoloStar 100 UNIT/ML Solostar Pen Generic drug: Insulin Glargine INJECT 15 UNITS SUBCUTANEOUSLY EVERY DAY What changed: See the new instructions.   loratadine 10 MG tablet Commonly known as: CLARITIN Take 1 tablet (10 mg total) by mouth daily.   metFORMIN 1000 MG tablet Commonly known as: GLUCOPHAGE TAKE 1 TABLET BY MOUTH TWICE DAILY WITH A MEAL What changed: See the new instructions.   NEXPLANON Ruth Inject 1 Device into the skin once. Every 3 years   omeprazole 20 MG capsule Commonly known as: PRILOSEC TAKE 1 CAPSULE BY MOUTH EVERY DAY What changed: how much to take   OXYGEN Inhale 2-4 L into the lungs continuous.   potassium chloride SA 20 MEQ tablet Commonly known as: KLOR-CON Take 1 tablet (20 mEq total) by mouth daily.   predniSONE 10 MG tablet Commonly known as: DELTASONE Take 1 tablet (10 mg total) by mouth daily with breakfast. Start taking on: November 03, 2019   rifampin 300 MG capsule Commonly known as: RIFADIN Take 2 capsules (600 mg total) by mouth daily for 20 days.   torsemide 20 MG tablet Commonly known as: DEMADEX Take 1 tablet (20 mg total) by mouth daily.   traZODone 50 MG tablet Commonly known as: DESYREL TAKE 1 TABLET (50 MG TOTAL) BY MOUTH AT BEDTIME AS NEEDED FOR SLEEP.               Home Infusion Instuctions  (From admission, onward)           Start     Ordered   11/01/19 0000  Home infusion instructions    Question:  Instructions  Answer:  Flushing of vascular access device: 0.9% NaCl pre/post medication administration and prn patency; Heparin 100 u/ml, 38m for implanted ports and Heparin 10u/ml, 561mfor all other central venous catheters.   11/01/19 1646            Discharge Instructions: Please refer to Patient Instructions section of EMR for full details.  Patient was  counseled important signs and symptoms that should prompt return to medical care, changes in medications, dietary instructions, activity restrictions, and follow up appointments.   Follow-Up Appointments: Follow-up Information     [facility]  on 11/03/2019.   Why: VIRTUAL VISIT AT 2:25pm. You will receive a call for your visit to make it a video, THIS VISIT IS NOT IN PERSON.  Contact information: [place]        Michel Bickers, MD. Daphane Shepherd on 11/30/2019.   Specialty: Infectious Diseases Contact information: [address] [place] [address] [phone]         [facility]. Go on 11/10/2019.             Stark Klein, MD 11/02/2019, 1:46 PM PGY-1, [place], MD, MS FAMILY MEDICINE RESIDENT - PGY3 11/02/2019 6:59 PM

## 2019-10-25 NOTE — Progress Notes (Signed)
RT NOTES: ABG obtained per order. Called lab and notified them of the ABG that was sent.

## 2019-10-25 NOTE — Progress Notes (Signed)
Inpatient Diabetes Program Recommendations  AACE/ADA: New Consensus Statement on Inpatient Glycemic Control  Target Ranges:  Prepandial:   less than 140 mg/dL      Peak postprandial:   less than 180 mg/dL (1-2 hours)      Critically ill patients:  140 - 180 mg/dL   Results for Makayla Vasquez, Makayla Vasquez (MRN [id number]) as of 10/25/2019 10:56  Ref. Range 10/24/2019 04:47 10/24/2019 12:06 10/24/2019 16:34 10/24/2019 20:35 10/25/2019 01:01 10/25/2019 05:03 10/25/2019 09:11  Glucose-Capillary Latest Ref Range: 70 - 99 mg/dL 409 (H) 811 (H) 914 (H) 173 (H) 157 (H) 268 (H) 261 (H)   Review of Glycemic Control  Diabetes history: DM2 Outpatient Diabetes medications: Lantus 15 units daily, Novolog 8 units TID with meals, Metformin 1000 mg BID Current orders for Inpatient glycemic control: Lantus 10 units QHS, Novolog 0-9 units Q4H; Decadron 6 mg Q24H  Inpatient Diabetes Program Recommendations:   Insulin - Basal: Noted Lantus 10 units QHS ordered today to start at bedtime. May want to consider changing frequency to daily to start now.  Insulin - Meal Coverage: If steroids are continued, please consider ordering Novolog 6 units TID with meals for meal coverage if patient eats at least 50% of meals.  Thanks, [name], RN, MSN, CDE Diabetes Coordinator Inpatient Diabetes Program [phone] (Team Pager from 8am to 5pm)

## 2019-10-25 NOTE — Progress Notes (Signed)
PHARMACY - PHYSICIAN COMMUNICATION CRITICAL VALUE ALERT - BLOOD CULTURE IDENTIFICATION (BCID)  Makayla Vasquez is an 39 y.o. female who presented to [facility] on 10/23/2019 with a chief complaint of extreme fatigue  Assessment:  She is noted COVID positive and with HF. Blood cultures show GPC/clusters in 1/4 bottles. WBC= WNL, afebrile  Name of physician (or Provider) Contacted: Dr. Atha Starks  Current antibiotics: none  Changes to prescribed antibiotics recommended:  -no changes recommended -MD Team will discuss and add antibiotics if needed   Harland German, PharmD Clinical Pharmacist **Pharmacist phone directory can now be found on [email or web address] ([email or web address]).  Listed under [facility].

## 2019-10-25 NOTE — Care Management (Signed)
CM acknowledges consult for Covid at Home program.  CM discussed referral with attending Dr Lenard Galloway - attending would like to review information before moving forward with referral.  CM forwarded information via email.  Attending service to follow up with CM in the am regarding if referral should be pursued.

## 2019-10-25 NOTE — Progress Notes (Addendum)
NURSING PROGRESS NOTE  AZLIN ZILBERMAN [id number] Admission Data: 10/25/2019 6:01 AM Attending Provider: Moses Manners, MD JXB:[id number], Marcial Pacas, DO Code Status: Full  Allergies:  Atorvastatin, Sertraline hcl, and Tape Past Medical History:   has a past medical history of Anxiety, Arthritis, Asthma, Asymptomatic LV dysfunction, Bipolar affective disorder [facility]), Cardiac pacemaker, Carpal tunnel syndrome of right wrist, CHF (congestive heart failure) (HCC), Congenital complete AV block, Cor pulmonale, chronic (HCC) (04/08/2018), Depression, Diabetes mellitus without complication (HCC), GERD (gastroesophageal reflux disease), Hypertension, Interstitial lung disease (HCC), Lupus (HCC), Lupus (systemic lupus erythematosus) (HCC), Obesity, Pneumonitis, Presence of permanent cardiac pacemaker, Seizures (HCC), and Sinus tachycardia. Past Surgical History:   has a past surgical history that includes Throat surgery (1994); Cesarean section; Insert / replace / remove pacemaker; Cholecystectomy; IUD removal (11/03/2011); Cyst excision (12/10/2012); Thyroglossal duct cyst (N/A, 12/10/2012); Nasal fracture surgery; Carpal tunnel with cubital tunnel (Right, 07/26/2013); Video bronchoscopy (Bilateral, 06/19/2014); bi-ventricular pacemaker upgrade (N/A, 03/08/2012); Atrial tach ablation (N/A, 08/14/2014); and right heart catheterization (N/A, 10/26/2014). Social History:   reports that she quit smoking about 23 years ago. Her smoking use included cigarettes. She has a 0.13 pack-year smoking history. She has never used smokeless tobacco. She reports that she does not drink alcohol or use drugs.  Makayla Vasquez is a 39 y.o. female patient admitted from ED:   Last Documented Vital Signs: Blood pressure 95/71, pulse (!) 107, temperature 97.7 F (36.5 C), temperature source Oral, resp. rate (!) 23, height 5\' 5"  (1.651 m), weight 69 kg, SpO2 95 %.  Cardiac Monitoring: Box # [id number] in place. Cardiac monitor yields:sinus  tachycardia.  IV Fluids:  IV in place, occlusive dsg intact without redness, IV cath antecubital left, condition patent and no redness none.   Skin: Intact  Patient/Family orientated to room. Information packet given to patient/family. Admission inpatient armband information verified with patient/family to include name and date of birth and placed on patient arm. Side rails up x 2, fall assessment and education completed with patient/family. Patient/family able to verbalize understanding of risk associated with falls and verbalized understanding to call for assistance before getting out of bed. Call light within reach. Patient/family able to voice and demonstrate understanding of unit orientation instructions.    Will continue to evaluate and treat per MD orders.   , RN

## 2019-10-25 NOTE — Progress Notes (Addendum)
Family Medicine Teaching Service Daily Progress Note Intern Pager: [phone]  Patient name: Makayla Vasquez Medical record number: [id number] Date of birth: December 22, 1980 Age: 39 y.o. Gender: female  Primary Care Provider: Nuala Alpha, DO Consultants: None Code Status: Full  Pt Overview and Major Events to Date:  1/4-admitted  Assessment and Plan:  COVID-19  acute on chronic respiratory failure  hypoxemia CRP trending up at 2.8 from 1.8 on admission.  WBC 9.8.  Currently on 3 liters oxygen via nasal cannula satting 95%.  Home requirement of 2-4 L.  Patient slightly tachycardic with most recent pulse 107.  Patient also tachypneic with respiratory rates ranging from low 20s to upper 30s.  She denies respiratory complaints at this time. -Continue airborne and contact precautions -Continuous cardiac monitoring pulse ox -Remdesivir infusion per pharmacy for 5 days, day 2 of 5 -Decadron 6 mg p.o. daily for 10 days, day 2 of 10 -Maintain O2 sat greater than 90% via oxygen therapy -Heart healthy/carb modified diet -Monitor CRP, BMP, CBC  HFrEF Per chart review patient weight on presentation appears to be approximately 20 pounds above her baseline over the past few months. Initial Echo 05/2014 EF 20-25%, echo 1/19 improved with EF 45-50% and mild-moderately decreased systolic function. Echo 11/2018 EF 35-40% with mild RV systolic dysfunction. NYHA IIIb chronically.  On home oxygen. Home medications include bisoprolol 5 mg daily, Lasix 40 mg daily, potassium.  Patient was recently to begin cardiac and pulmonary rehab, she is followed by the heart failure clinic. -Continue bisoprolol 5 mg daily -Status post Lasix 40 mg IV twice daily yesterday. -We will hold patient's Lasix today and monitor her fluid volume status. -Can consider restarting patient's home dose of Lasix tomorrow or torsemide. -Strict I's and O's -Daily weights -Daily BMP  Congenital AV block s/p pacemaker Congenital heart  block underwent first PPM at age 46 (mother had SLE). 2011 had echo with EF 35 to 40%.  The dysfunction was thought to be due to RV pacing so upgraded to CRT-P.  Patient follows with Dr. Lovena Le in EP.  Last battery change was in 2004, according to notes patient is due for a battery change.  Lupus  Anti-Synthetase Syndrome (anti-Jo-1 antibody, interstitial lung disease, myositis, and elevated CK) Patient is followed by Tonna Corner at [facility].  CK is chronically elevated and is sensitive to steroids.  Home medications include CellCept, tacrolimus, prednisone 10 mg.  Patient says she has not taken her CellCept since last week due to not feeling well. -Continue holding CellCept at this time -Hold prednisone for Decadron treatment -Continue tacrolimus  Interstitial lung disease Followed by Dr. Baird Lyons with [facility] pul[facility]ology.  Former smoker.  Home medications include albuterol 2 puffs every 4 hours as needed, cromolyn, prednisone 10 mg daily, Breo Ellipta.  Patient is also on 2-4 L of oxygen via nasal cannula at baseline -Continue home medications  Type 2 diabetes CBGs over past 24 hours ranging from 157-268.  Last A1c 7.0 on October 03, 2019.  Home medications include Lantus 15 units nightly, Metformin 1000 mg twice daily, NovoLog 8 units 3 times daily before every meal.  Patient using 18 units of insulin aspart over past 24 hours.  We will start 10units Lantus. -Hold home medications -Sensitive sliding scale insulin -Starting 10 units Lantus  GERD Home medications includes omeprazole. -Pantoprazole on formulary  HLD Last lipid profile was in 2016: Total cholesterol 200, HDL 31, LDL 136, triglycerides 163.  Patient is not on a statin  due to intolerance, as well as antisynthetase syndrome with elevated CK and myopathy. -Obtain lipid panel  FEN/GI: Heart healthy/carb modified PPx: Lovenox  Disposition: Pending improvement  Subjective:  Patient states she still  has some abdominal tenderness but overall feels a bit better than she did yesterday.  She denies diarrhea at this time.  She states she has some slight wheezing but does not believe she feels worse from a respiratory standpoint than normal.  Objective: Temp:  [97.7 F (36.5 C)] 97.7 F (36.5 C) (01/05 0506) Pulse Rate:  [90-107] 107 (01/05 0506) Resp:  [13-39] 23 (01/05 0506) BP: (90-116)/(62-85) 95/71 (01/05 0506) SpO2:  [92 %-100 %] 95 % (01/05 0506)  Physical Exam: General: Alert and oriented in no apparent distress Heart: Regular rate and rhythm with no murmurs appreciated Lungs: Some expiratory wheezes present, no respiratory distress Abdomen: Bowel sounds present, only mild abdominal discomfort to palpation Skin: Warm and dry   Laboratory: Recent Labs  Lab 10/23/19 2300 10/24/19 0452 10/25/19 0328  WBC 7.4 7.3 9.8  HGB 9.9* 8.7* 8.5*  HCT 35.9* 32.5* 30.0*  PLT 444* 378 362   Recent Labs  Lab 10/23/19 2300 10/24/19 0452 10/25/19 0328  NA 133*  --  136  K 4.1  --  4.0  CL 100  --  101  CO2 23  --  25  BUN 14  --  24*  CREATININE 1.04* 0.90 1.02*  CALCIUM 8.5*  --  8.1*  PROT 7.6  --  6.8  BILITOT 0.5  --  0.7  ALKPHOS 63  --  60  ALT 11  --  11  AST 19  --  17  GLUCOSE 167*  --  285*      Imaging/Diagnostic Tests:   Jackelyn Poling, DO 10/25/2019, 9:23 AM PGY-1, [facility] Family Medicine FPTS Intern pager: [phone], text pages welcome

## 2019-10-25 NOTE — Progress Notes (Signed)
Spoke with patient's rheumatologist, Dr. Geannie Risen.  She recommended at this time to hold patient's CellCept and Tacrolimus while the patient is hospitalized for COVID-19.  She recommended continuing with the 10 mg prednisone per day.  She also stated that at this time she does believes the patient needs Bactrim prophylactic coverage from rheumatology standpoint.  States she would contact the patient to schedule outpatient follow-up next week.  We will discontinue patient's tacrolimus and continue to monitor her overall status.

## 2019-10-26 ENCOUNTER — Inpatient Hospital Stay (HOSPITAL_COMMUNITY): Payer: Medicare HMO

## 2019-10-26 DIAGNOSIS — M329 Systemic lupus erythematosus, unspecified: Secondary | ICD-10-CM

## 2019-10-26 DIAGNOSIS — E1169 Type 2 diabetes mellitus with other specified complication: Secondary | ICD-10-CM

## 2019-10-26 DIAGNOSIS — B9561 Methicillin susceptible Staphylococcus aureus infection as the cause of diseases classified elsewhere: Secondary | ICD-10-CM | POA: Diagnosis present

## 2019-10-26 DIAGNOSIS — E669 Obesity, unspecified: Secondary | ICD-10-CM

## 2019-10-26 DIAGNOSIS — I361 Nonrheumatic tricuspid (valve) insufficiency: Secondary | ICD-10-CM

## 2019-10-26 DIAGNOSIS — I340 Nonrheumatic mitral (valve) insufficiency: Secondary | ICD-10-CM

## 2019-10-26 DIAGNOSIS — E785 Hyperlipidemia, unspecified: Secondary | ICD-10-CM

## 2019-10-26 LAB — COMPREHENSIVE METABOLIC PANEL
ALT: 11 U/L (ref 0–44)
AST: 19 U/L (ref 15–41)
Albumin: 2.9 g/dL — ABNORMAL LOW (ref 3.5–5.0)
Alkaline Phosphatase: 55 U/L (ref 38–126)
Anion gap: 12 (ref 5–15)
BUN: 19 mg/dL (ref 6–20)
CO2: 25 mmol/L (ref 22–32)
Calcium: 8.3 mg/dL — ABNORMAL LOW (ref 8.9–10.3)
Chloride: 101 mmol/L (ref 98–111)
Creatinine, Ser: 0.86 mg/dL (ref 0.44–1.00)
GFR calc Af Amer: >60 mL/min (ref >60)
GFR calc non Af Amer: >60 mL/min (ref >60)
Glucose, Bld: 293 mg/dL — ABNORMAL HIGH (ref 70–99)
Potassium: 4.5 mmol/L (ref 3.5–5.1)
Sodium: 138 mmol/L (ref 135–145)
Total Bilirubin: 0.3 mg/dL (ref 0.3–1.2)
Total Protein: 6.7 g/dL (ref 6.5–8.1)

## 2019-10-26 LAB — CBC WITH DIFFERENTIAL/PLATELET
Abs Immature Granulocytes: 0.03 10*3/uL (ref 0.00–0.07)
Basophils Absolute: 0 10*3/uL (ref 0.0–0.1)
Basophils Relative: 0 %
Eosinophils Absolute: 0 10*3/uL (ref 0.0–0.5)
Eosinophils Relative: 0 %
HCT: 30.8 % — ABNORMAL LOW (ref 36.0–46.0)
Hemoglobin: 8.3 g/dL — ABNORMAL LOW (ref 12.0–15.0)
Immature Granulocytes: 0 %
Lymphocytes Relative: 16 %
Lymphs Abs: 1.1 10*3/uL (ref 0.7–4.0)
MCH: 20 pg — ABNORMAL LOW (ref 26.0–34.0)
MCHC: 26.9 g/dL — ABNORMAL LOW (ref 30.0–36.0)
MCV: 74.4 fL — ABNORMAL LOW (ref 80.0–100.0)
Monocytes Absolute: 0.2 10*3/uL (ref 0.1–1.0)
Monocytes Relative: 3 %
Neutro Abs: 5.6 10*3/uL (ref 1.7–7.7)
Neutrophils Relative %: 81 %
Platelets: 390 10*3/uL (ref 150–400)
RBC: 4.14 MIL/uL (ref 3.87–5.11)
RDW: 18.5 % — ABNORMAL HIGH (ref 11.5–15.5)
WBC: 7 10*3/uL (ref 4.0–10.5)
nRBC: 0 % (ref 0.0–0.2)

## 2019-10-26 LAB — ECHOCARDIOGRAM COMPLETE
Height: 65 in
Weight: 2275.15 oz

## 2019-10-26 LAB — GLUCOSE, CAPILLARY
Glucose-Capillary: 139 mg/dL — ABNORMAL HIGH (ref 70–99)
Glucose-Capillary: 144 mg/dL — ABNORMAL HIGH (ref 70–99)
Glucose-Capillary: 196 mg/dL — ABNORMAL HIGH (ref 70–99)
Glucose-Capillary: 198 mg/dL — ABNORMAL HIGH (ref 70–99)
Glucose-Capillary: 274 mg/dL — ABNORMAL HIGH (ref 70–99)
Glucose-Capillary: 305 mg/dL — ABNORMAL HIGH (ref 70–99)

## 2019-10-26 LAB — C-REACTIVE PROTEIN: CRP: 1.2 mg/dL — ABNORMAL HIGH (ref <1.0)

## 2019-10-26 MED ORDER — RIFAMPIN 300 MG PO CAPS
600.0000 mg | ORAL_CAPSULE | Freq: Every day | ORAL | Status: DC
  Administered 2019-10-26 – 2019-11-02 (×8): 600 mg via ORAL
  Filled 2019-10-26 (×8): qty 2

## 2019-10-26 MED ORDER — GUAIFENESIN 100 MG/5ML PO SOLN
5.0000 mL | ORAL | Status: DC | PRN
  Administered 2019-10-26 – 2019-11-01 (×7): 100 mg via ORAL
  Filled 2019-10-26 (×6): qty 5
  Filled 2019-10-26: qty 25

## 2019-10-26 MED ORDER — CEFAZOLIN SODIUM-DEXTROSE 2-4 GM/100ML-% IV SOLN
2.0000 g | Freq: Three times a day (TID) | INTRAVENOUS | Status: DC
  Administered 2019-10-26 – 2019-11-01 (×18): 2 g via INTRAVENOUS
  Filled 2019-10-26 (×22): qty 100

## 2019-10-26 MED ORDER — VANCOMYCIN HCL 10 G IV SOLR
1600.0000 mg | Freq: Once | INTRAVENOUS | Status: DC
  Filled 2019-10-26: qty 1600

## 2019-10-26 MED ORDER — TORSEMIDE 20 MG PO TABS
20.0000 mg | ORAL_TABLET | Freq: Every day | ORAL | Status: DC
  Administered 2019-10-26 – 2019-11-02 (×8): 20 mg via ORAL
  Filled 2019-10-26 (×8): qty 1

## 2019-10-26 MED ORDER — RIFAMPIN 300 MG PO CAPS
600.0000 mg | ORAL_CAPSULE | Freq: Every day | ORAL | Status: DC
  Filled 2019-10-26: qty 2

## 2019-10-26 MED ORDER — INSULIN GLARGINE 100 UNIT/ML ~~LOC~~ SOLN
15.0000 [IU] | Freq: Every day | SUBCUTANEOUS | Status: DC
  Administered 2019-10-26: 15 [IU] via SUBCUTANEOUS
  Filled 2019-10-26 (×2): qty 0.15

## 2019-10-26 MED ORDER — CEFAZOLIN SODIUM-DEXTROSE 2-4 GM/100ML-% IV SOLN
2.0000 g | Freq: Three times a day (TID) | INTRAVENOUS | Status: DC
  Filled 2019-10-26 (×2): qty 100

## 2019-10-26 MED ORDER — VANCOMYCIN HCL 750 MG/150ML IV SOLN
750.0000 mg | Freq: Two times a day (BID) | INTRAVENOUS | Status: DC
  Administered 2019-10-26 – 2019-10-27 (×2): 750 mg via INTRAVENOUS
  Filled 2019-10-26 (×3): qty 150

## 2019-10-26 MED ORDER — VANCOMYCIN HCL 750 MG/150ML IV SOLN
750.0000 mg | Freq: Two times a day (BID) | INTRAVENOUS | Status: DC
  Filled 2019-10-26: qty 150

## 2019-10-26 NOTE — Progress Notes (Signed)
Pharmacy Antibiotic Note  Makayla Vasquez is a 39 y.o. female admitted on 10/23/2019 with COVID-19 infection found to have staph aureus bacteremia/ Pharmacy has been consulted for vancomycin, cefazolin and rifampin dosing.  We will hold abx until she gets repeat [facility] then will start the three drug regimen. Follow up [facility] sensitivities for de-escalation.   Plan: Vancomycin 750 IV q12h  AUC 457, Scr 0.86, Vd47 Cefazolin 2g IV q8h Rifampin 600 mg daily  Height: 5\' 5"  (165.1 cm) Weight: 142 lb 3.2 oz (64.5 kg) IBW/kg (Calculated) : 57  Temp (24hrs), Avg:98 F (36.7 C), Min:97.7 F (36.5 C), Max:98.4 F (36.9 C)  Recent Labs  Lab 10/23/19 2300 10/24/19 0452 10/25/19 0328 10/26/19 0634  WBC 7.4 7.3 9.8 7.0  CREATININE 1.04* 0.90 1.02* 0.86  LATICACIDVEN 1.7 1.1  --   --     Estimated Creatinine Clearance: 79 mL/min (by C-G formula based on SCr of 0.86 mg/dL).    Allergies  Allergen Reactions  . 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 Sertraline Hcl Hives  . Tape Other (See Comments)    "Burns" the skin    Antimicrobials this admission: Remdesivir 1/4 >>1/8 Vancomycin 1/6 >>  Cefazolin 1/6 >> Rifampin 1/6 >>   Microbiology results: 1/4 BCx: GPCCL 1/6 BCx: order sent  Thank you for allowing pharmacy to be a part of this patient's care.  3/4 [facility] 10/26/2019 8:22 AM

## 2019-10-26 NOTE — Progress Notes (Signed)
Inpatient Diabetes Program Recommendations  AACE/ADA: New Consensus Statement on Inpatient Glycemic Control (2015)  Target Ranges:  Prepandial:   less than 140 mg/dL      Peak postprandial:   less than 180 mg/dL (1-2 hours)      Critically ill patients:  140 - 180 mg/dL   Lab Results  Component Value Date   GLUCAP 274 (H) 10/26/2019   HGBA1C 7.0 10/03/2019    Review of Glycemic Control Results for Makayla Vasquez, Makayla Vasquez (MRN [id number]) as of 10/26/2019 12:05  Ref. Range 10/26/2019 04:04 10/26/2019 07:25 10/26/2019 11:59  Glucose-Capillary Latest Ref Range: 70 - 99 mg/dL 301 (H) 599 (H) 689 (H)  Diabetes history: DM2 Outpatient Diabetes medications: Lantus 15 units daily, Novolog 8 units TID with meals, Metformin 1000 mg BID Current orders for Inpatient glycemic control: Lantus 15 units QHS, Novolog 0-9 units Q4H; Decadron 6 mg Q24H  Inpatient Diabetes Program Recommendations:   Insulin - Basal: Noted Lantus 15 units QHS ordered today to start at bedtime. May want to consider changing frequency to daily to start now.  Insulin - Meal Coverage: If steroids are continued, please consider ordering Novolog 6 units TID with meals for meal coverage if patient eats at least 50% of meals.  Thanks, Lujean Rave, MSN, RNC-OB Diabetes Coordinator [phone] (8a-5p)

## 2019-10-26 NOTE — Progress Notes (Signed)
Patient ID: Makayla Vasquez, female   DOB: 27-Jul-1981, 39 y.o.   MRN: [id number]         [facility] for Infectious Disease    Date of Admission:  10/23/2019          Reason for Consult: Automatic consultation for staph aureus bacteremia     Assessment: She has staph aureus bacteremia superimposed on COVID-19.  I will order repeat blood cultures and TTE and then start vancomycin and cefazolin pending antibiotic susceptibility results (rapid detection of methicillin resistance is currently not being performed when only 1 blood cultures positive for staph aureus).  I will also add rifampin since she has a permanent pacemaker.  Plan: 1. Repeat blood cultures 2. TTE 3. Start vancomycin, cefazolin and rifampin  Active Problems:   Staphylococcus aureus bacteremia   COVID-19   Acute on chronic HFrEF (heart failure with reduced ejection fraction) ([place])   Diabetes mellitus type 2 in obese ([place])   Hyperlipidemia associated with type 2 diabetes mellitus (HCC)   Lupus (HCC)   Chronic obstructive pulmonary disease (HCC)   Scheduled Meds: . bisoprolol  5 mg Oral Daily  . dexamethasone  6 mg Oral Q24H  . enoxaparin (LOVENOX) injection  40 mg Subcutaneous Daily  . ferrous sulfate  325 mg Oral Q breakfast  . fluticasone furoate-vilanterol  1 puff Inhalation Daily  . insulin aspart  0-9 Units Subcutaneous Q4H  . insulin glargine  10 Units Subcutaneous QHS  . loratadine  10 mg Oral Daily  . pantoprazole  40 mg Oral Daily   Continuous Infusions: . remdesivir 100 mg in NS 100 mL 100 mg (10/25/19 0959)   PRN Meds:.acetaminophen, albuterol, polyethylene glycol, traZODone  HPI: Makayla Vasquez is a 39 y.o. female with lupus, interstitial lung disease and heart failure who was admitted yesterday with a 1 month history of worsening cough and shortness of breath.  She was found to have COVID-19 and started on remdesivir and dexamethasone.  Her temperature was 100.1 degrees upon admission.  Blood  cultures were obtained and in 1 of 2 blood cultures is growing staph aureus.  There were no acute changes on her chest x-ray.  She requires supplemental oxygen at home.  Her oxygen saturation currently is 94% on 3 L which appears to be close to her baseline.  She has a permanent pacemaker   Review of Systems: Review of Systems  Unable to perform ROS: Other  Constitutional:       This is a remote consultation so no review of systems was obtained.    Past Medical History:  Diagnosis Date  . Anxiety   . Arthritis    rheumatoid arthritis- mild, no rheumatology care   . Asthma    seasonal allergies   . Asymptomatic LV dysfunction    a. Echo in Dec 2011 with EF 35 to 40%. Felt to be due to paced rhythm. b. EF 25-30% in 07/2014.  . Bipolar affective disorder ([place])   . Cardiac pacemaker    a. Since age 23 in 69. b. Upgrade to BiV in 2013.  [name] Carpal tunnel syndrome of right wrist   . CHF (congestive heart failure) ([place])   . Congenital complete AV block   . Cor pulmonale, chronic ([place]) 04/08/2018  . Depression    bipolar  . Diabetes mellitus without complication ([place])   . GERD (gastroesophageal reflux disease)   . Hypertension   . Interstitial lung disease ([place])   . Lupus ([place])   .  Lupus (systemic lupus erythematosus) (HCC)   . Obesity   . Pneumonitis    a. a/w hypoxia - inflammatory - large workup 07/2014.  . Presence of permanent cardiac pacemaker   . Seizures (HCC)    as a child- from high fever  . Sinus tachycardia     Social History   Tobacco Use  . Smoking status: Former Smoker    Packs/day: 0.25    Years: 0.50    Pack years: 0.12    Types: Cigarettes    Quit date: 07/25/1996    Years since quitting: 23.2  . Smokeless tobacco: Never Used  Substance Use Topics  . Alcohol use: No  . Drug use: No    Family History  Problem Relation Age of Onset  . Heart disease Mother        CHF (no details)  . Hypertension Mother   . Lupus Mother   . Heart disease Father         Murmur  . Heart disease Sister 40        No details.  History of a pacemaker   Allergies  Allergen Reactions  . 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 Sertraline Hcl Hives  . Tape Other (See Comments)    "Burns" the skin    OBJECTIVE: Blood pressure 94/67, pulse 97, temperature 98 F (36.7 C), temperature source Oral, resp. rate (!) 34, height 5\' 5"  (1.651 m), weight 64.5 kg, SpO2 96 %.  Physical Exam Constitutional:      Comments: This is a remote consultation so no physical examination was performed.     Lab Results Lab Results  Component Value Date   WBC 7.0 10/26/2019   HGB 8.3 (L) 10/26/2019   HCT 30.8 (L) 10/26/2019   MCV 74.4 (L) 10/26/2019   PLT 390 10/26/2019    Lab Results  Component Value Date   CREATININE 0.86 10/26/2019   BUN 19 10/26/2019   NA 138 10/26/2019   K 4.5 10/26/2019   CL 101 10/26/2019   CO2 25 10/26/2019    Lab Results  Component Value Date   ALT 11 10/26/2019   AST 19 10/26/2019   ALKPHOS 55 10/26/2019   BILITOT 0.3 10/26/2019     Microbiology: Recent Results (from the past 240 hour(s))  Blood Culture (routine x 2)     Status: None (Preliminary result)   Collection Time: 10/23/19 11:00 PM   Specimen: BLOOD  Result Value Ref Range Status   Specimen Description BLOOD LEFT ANTECUBITAL  Final   Special Requests   Final    BOTTLES DRAWN AEROBIC AND ANAEROBIC Blood Culture adequate volume   Culture   Final    NO GROWTH 2 DAYS Performed at [facility], [address], [place], [place]    Report Status PENDING  Incomplete  Blood Culture (routine x 2)     Status: Abnormal (Preliminary result)   Collection Time: 10/24/19  4:52 AM   Specimen: BLOOD RIGHT WRIST  Result Value Ref Range Status   Specimen Description BLOOD RIGHT WRIST  Final   Special Requests   Final    BOTTLES DRAWN AEROBIC AND ANAEROBIC Blood Culture adequate volume   Culture   Setup Time   Final    GRAM POSITIVE COCCI IN CLUSTERS AEROBIC BOTTLE ONLY CRITICAL RESULT CALLED TO, READ BACK BY AND VERIFIED WITH: A MEYER PHARMD 1549 10/25/19 A BROWNING Performed  at [facility], [address], [place], [place] [address]    Culture STAPHYLOCOCCUS AUREUS (A)  Final   Report Status PENDING  Incomplete    Cliffton Asters, MD [facility] for Infectious Disease [facility] [phone] pager   [phone] cell 10/26/2019, 8:06 AM

## 2019-10-26 NOTE — Progress Notes (Signed)
  Echocardiogram 2D Echocardiogram has been performed.  Makayla Vasquez G Sami Froh 10/26/2019, 3:56 PM

## 2019-10-26 NOTE — Progress Notes (Addendum)
Family Medicine Teaching Service Daily Progress Note Intern Pager: [phone]  Patient name: Makayla Vasquez Medical record number: [id number] Date of birth: [birth year] Age: 39 y.o. Gender: female  Primary Care Provider: Nuala Alpha, DO Consultants: None Code Status: Full  Pt Overview and Major Events to Date:  1/4-admitted  Assessment and Plan:  COVID-19  acute on chronic respiratory failure  hypoxemia CRP trending up at 2.8 from 1.8 on admission.  WBC 9.8.  Currently on 3 liters oxygen via nasal cannula satting 95%.  Home requirement of 2-4 L.  Patient slightly tachycardic with most recent pulse 107.  Patient also tachypneic with respiratory rates ranging from low 20s to upper 30s.  She denies respiratory complaints at this time. -Continue airborne and contact precautions -Continuous cardiac monitoring pulse ox -Remdesivir infusion per pharmacy for 5 days, day 3 of 5 -Decadron 6 mg p.o. daily for 10 days, day 3 of 10 -Maintain O2 sat greater than 90% via oxygen therapy -Heart healthy/carb modified diet -Monitor CRP, BMP, CBC  Bacteremia Patient with 1/4 blood culture positive for staph aureus.  Sensitivities pending. -Infectious disease on board, appreciate recommendations  -Repeat blood culture  -TTE  -Plan to start vancomycin, cefazolin, rifampin after repeat blood cultures  HFrEF Patient's weight on presentation 152 pounds, currently 142 pounds.  She appears close to her baseline weight at this time. Initial Echo 05/2014 EF 20-25%, echo 1/19 improved with EF 45-50% and mild-moderately decreased systolic function. Echo 11/2018 EF 35-40% with mild RV systolic dysfunction. NYHA IIIb chronically.  On home oxygen. Home medications include bisoprolol 5 mg daily, Lasix 40 mg daily, potassium.  Patient was recently to begin cardiac and pulmonary rehab, she is followed by the heart failure clinic. -Continue bisoprolol 5 mg daily -Status post Lasix 40 mg IV twice daily on 1/4. -We  held patient's Lasix yesterday to monitor fluid status.  We will start patient on 20 mg/day of torsemide. -Strict I's and O's -Daily weights -Daily BMP  Congenital AV block s/p pacemaker Congenital heart block underwent first PPM at age 64 (mother had SLE). 2011 had echo with EF 35 to 40%.  The dysfunction was thought to be due to RV pacing so upgraded to CRT-P.  Patient follows with Dr. Lovena Le in EP.  Last battery change was in 2004, according to notes patient is due for a battery change.  Lupus  Anti-Synthetase Syndrome (anti-Jo-1 antibody, interstitial lung disease, myositis, and elevated CK) Patient is followed by Tonna Corner at [facility] rheumatology.  CK is chronically elevated and is sensitive to steroids.  Home medications include CellCept, tacrolimus, prednisone 10 mg.  Patient says she has not taken her CellCept since last week due to not feeling well. -Continue holding CellCept and tacrolimus at this time -Hold prednisone for Decadron treatment  Interstitial lung disease Followed by Dr. Baird Lyons with [facility] pulmonology.  Former smoker.  Home medications include albuterol 2 puffs every 4 hours as needed, cromolyn, prednisone 10 mg daily, Breo Ellipta.  Patient is also on 2-4 L of oxygen via nasal cannula at baseline -Continue home medications  Type 2 diabetes CBGs over past 24 hours ranging from 198-305.  Last A1c 7.0 on October 03, 2019.  Home medications include Lantus 15 units nightly, Metformin 1000 mg twice daily, NovoLog 8 units 3 times daily before every meal.  Patient using 10 units of Lantus with use of 22units of insulin aspart over past 24 hours.  . -Hold home medications -Sensitive sliding scale insulin -We will  increase to 15units of Lantus while on dexamethasone  GERD Home medications includes omeprazole. -Pantoprazole on formulary  HLD Last lipid profile was in 2016: Total cholesterol 200, HDL 31, LDL 136, triglycerides 163.  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 -Obtain lipid panel  FEN/GI: Heart healthy/carb modified PPx: Lovenox  Disposition: Pending improvement  Subjective:  Patient states she continues to get closer to her baseline.  She is not quite there yet. She does endorse a new cough and some congestion which seems to start around yesterday per her.  She believes that she is back down to her normal fluid status but does believe when she came in she was fluid up.  Patient continues to do well on her normal oxygen requirement.  Objective: Temp:  [97.7 F (36.5 C)-98.4 F (36.9 C)] 98 F (36.7 C) (01/06 0405) Pulse Rate:  [90-99] 97 (01/06 0405) Resp:  [26-34] 34 (01/06 0405) BP: (84-97)/(66-70) 94/67 (01/06 0405) SpO2:  [96 %-100 %] 96 % (01/06 0405) Weight:  [64.5 kg] 64.5 kg (01/06 0405)  Physical Exam: General: Alert and oriented in no apparent distress Heart: Regular rate and rhythm with no murmurs appreciated Lungs: Lungs clear, no respiratory distress.  Satting well on 3 L nasal cannula. Abdomen: Bowel sounds present, no abdominal pain  Extremities: No lower extremity edema  Laboratory: Recent Labs  Lab 10/23/19 2300 10/24/19 0452 10/25/19 0328  WBC 7.4 7.3 9.8  HGB 9.9* 8.7* 8.5*  HCT 35.9* 32.5* 30.0*  PLT 444* 378 362   Recent Labs  Lab 10/23/19 2300 10/24/19 0452 10/25/19 0328  NA 133*  --  136  K 4.1  --  4.0  CL 100  --  101  CO2 23  --  25  BUN 14  --  24*  CREATININE 1.04* 0.90 1.02*  CALCIUM 8.5*  --  8.1*  PROT 7.6  --  6.8  BILITOT 0.5  --  0.7  ALKPHOS 63  --  60  ALT 11  --  11  AST 19  --  17  GLUCOSE 167*  --  285*      Imaging/Diagnostic Tests:   Jackelyn Poling, DO 10/26/2019, 6:06 AM PGY-1, [facility] Family Medicine FPTS Intern pager: [phone], text pages welcome

## 2019-10-27 LAB — CULTURE, BLOOD (ROUTINE X 2): Special Requests: ADEQUATE

## 2019-10-27 LAB — COMPREHENSIVE METABOLIC PANEL
ALT: 9 U/L (ref 0–44)
AST: 16 U/L (ref 15–41)
Albumin: 3 g/dL — ABNORMAL LOW (ref 3.5–5.0)
Alkaline Phosphatase: 56 U/L (ref 38–126)
Anion gap: 12 (ref 5–15)
BUN: 21 mg/dL — ABNORMAL HIGH (ref 6–20)
CO2: 26 mmol/L (ref 22–32)
Calcium: 8 mg/dL — ABNORMAL LOW (ref 8.9–10.3)
Chloride: 99 mmol/L (ref 98–111)
Creatinine, Ser: 1.14 mg/dL — ABNORMAL HIGH (ref 0.44–1.00)
GFR calc Af Amer: >60 mL/min (ref >60)
GFR calc non Af Amer: >60 mL/min (ref >60)
Glucose, Bld: 288 mg/dL — ABNORMAL HIGH (ref 70–99)
Potassium: 3.4 mmol/L — ABNORMAL LOW (ref 3.5–5.1)
Sodium: 137 mmol/L (ref 135–145)
Total Bilirubin: 0.5 mg/dL (ref 0.3–1.2)
Total Protein: 6.9 g/dL (ref 6.5–8.1)

## 2019-10-27 LAB — CBC WITH DIFFERENTIAL/PLATELET
Abs Immature Granulocytes: 0.03 10*3/uL (ref 0.00–0.07)
Basophils Absolute: 0 10*3/uL (ref 0.0–0.1)
Basophils Relative: 0 %
Eosinophils Absolute: 0 10*3/uL (ref 0.0–0.5)
Eosinophils Relative: 0 %
HCT: 30 % — ABNORMAL LOW (ref 36.0–46.0)
Hemoglobin: 8.4 g/dL — ABNORMAL LOW (ref 12.0–15.0)
Immature Granulocytes: 0 %
Lymphocytes Relative: 11 %
Lymphs Abs: 0.9 10*3/uL (ref 0.7–4.0)
MCH: 20.4 pg — ABNORMAL LOW (ref 26.0–34.0)
MCHC: 28 g/dL — ABNORMAL LOW (ref 30.0–36.0)
MCV: 73 fL — ABNORMAL LOW (ref 80.0–100.0)
Monocytes Absolute: 0.4 10*3/uL (ref 0.1–1.0)
Monocytes Relative: 5 %
Neutro Abs: 6.8 10*3/uL (ref 1.7–7.7)
Neutrophils Relative %: 84 %
Platelets: 352 10*3/uL (ref 150–400)
RBC: 4.11 MIL/uL (ref 3.87–5.11)
RDW: 18.3 % — ABNORMAL HIGH (ref 11.5–15.5)
WBC: 8.2 10*3/uL (ref 4.0–10.5)
nRBC: 0 % (ref 0.0–0.2)

## 2019-10-27 LAB — GLUCOSE, CAPILLARY
Glucose-Capillary: 213 mg/dL — ABNORMAL HIGH (ref 70–99)
Glucose-Capillary: 219 mg/dL — ABNORMAL HIGH (ref 70–99)
Glucose-Capillary: 261 mg/dL — ABNORMAL HIGH (ref 70–99)
Glucose-Capillary: 268 mg/dL — ABNORMAL HIGH (ref 70–99)
Glucose-Capillary: 278 mg/dL — ABNORMAL HIGH (ref 70–99)
Glucose-Capillary: 88 mg/dL (ref 70–99)

## 2019-10-27 LAB — C-REACTIVE PROTEIN: CRP: 0.9 mg/dL (ref <1.0)

## 2019-10-27 MED ORDER — INSULIN GLARGINE 100 UNIT/ML ~~LOC~~ SOLN
20.0000 [IU] | Freq: Every day | SUBCUTANEOUS | Status: DC
  Administered 2019-10-27 – 2019-10-28 (×2): 20 [IU] via SUBCUTANEOUS
  Filled 2019-10-27 (×3): qty 0.2

## 2019-10-27 MED ORDER — POTASSIUM CHLORIDE CRYS ER 20 MEQ PO TBCR
40.0000 meq | EXTENDED_RELEASE_TABLET | Freq: Once | ORAL | Status: AC
  Administered 2019-10-27: 40 meq via ORAL
  Filled 2019-10-27: qty 2

## 2019-10-27 MED ORDER — INSULIN ASPART 100 UNIT/ML ~~LOC~~ SOLN
0.0000 [IU] | Freq: Three times a day (TID) | SUBCUTANEOUS | Status: DC
  Administered 2019-10-27: 5 [IU] via SUBCUTANEOUS
  Administered 2019-10-27 – 2019-10-28 (×2): 3 [IU] via SUBCUTANEOUS
  Administered 2019-10-28: 2 [IU] via SUBCUTANEOUS
  Administered 2019-10-28: 9 [IU] via SUBCUTANEOUS
  Administered 2019-10-28: 3 [IU] via SUBCUTANEOUS
  Administered 2019-10-29 (×2): 5 [IU] via SUBCUTANEOUS
  Administered 2019-10-29 – 2019-10-30 (×2): 2 [IU] via SUBCUTANEOUS
  Administered 2019-10-30: 3 [IU] via SUBCUTANEOUS
  Administered 2019-10-30: 2 [IU] via SUBCUTANEOUS
  Administered 2019-10-31: 1 [IU] via SUBCUTANEOUS
  Administered 2019-10-31 – 2019-11-01 (×2): 2 [IU] via SUBCUTANEOUS
  Administered 2019-11-01: 1 [IU] via SUBCUTANEOUS

## 2019-10-27 NOTE — Progress Notes (Signed)
MEWS/VS Documentation      10/27/2019 0423 10/27/2019 0700 10/27/2019 1000 10/27/2019 1016   MEWS Score:  1  1  3  1    MEWS Score Color:  Green  Green  Yellow  Green   Resp:  16  --  (!) 29  20   Pulse:  84  --  79  --   BP:  90/63  --  100/70  --   Temp:  97.9 F (36.6 C)  --  --  --   O2 Device:  Nasal Cannula  --  Nasal Cannula  --   O2 Flow Rate (L/min):  --  --  3 L/min  --    This is not an acute change. MD aware. Will continue to monitor.

## 2019-10-27 NOTE — Progress Notes (Addendum)
Patient's RR is fluctuating between 20 - 30; patient is in bed talking on the phone; she verbalized that she is feeling well, she doesn't feel any distress. The rest of VS are stable Temp 98.6; PT 90/62; Pulse 84; Sat O2 97 on 2L O2. MD notified. Will continue to monitor.

## 2019-10-27 NOTE — Progress Notes (Signed)
Family Medicine Teaching Service Daily Progress Note Intern Pager: [phone]  Patient name: Makayla Vasquez Medical record number: [id number] Date of birth: [birth year] Age: 39 y.o. Gender: female  Primary Care Provider: Arlyce Harman, DO Consultants: None Code Status: Full  Pt Overview and Major Events to Date:  1/4-admitted  Assessment and Plan:  COVID-19  acute on chronic respiratory failure  hypoxemia CRP improved 0.9 from 2.8.  White blood cell count 8.2.  Currently satting well on 3 L nasal cannula.  Home requirement of 2-4 L.   -Continue airborne and contact precautions -Continuous cardiac monitoring pulse ox -Remdesivir infusion per pharmacy for 5 days, day 4 of 5 -Decadron 6 mg p.o. daily for 10 days, day 4 of 10 -Maintain O2 sat greater than 90% via oxygen therapy -Heart healthy/carb modified diet -Monitor CRP, BMP, CBC  Bacteremia Patient with 1/4 blood culture positive for staph aureus.  Sensitivities pending.  Echo showed 35-40% ejection fraction. -Infectious disease on board, appreciate recommendations  -Repeat blood culture  -TTE complete, TEE ordered.  Since it does not look like TEE will be complete today will restart patient diet.  -Plan to start vancomycin, cefazolin, rifampin after repeat blood cultures  HFrEF Patient's weight on presentation 152 pounds, currently 141 pounds.  She appears close to her baseline weight at this time.  Patient did have a bump in her creatinine from 0.86-1.14.  Echo performed 10/26/2019 showed ejection fraction of 30-40% with grade 1 diastolic dysfunction.  Echo 11/2018 EF 35-40% with mild RV systolic dysfunction. NYHA IIIb chronically.  On home oxygen. Home medications include bisoprolol 5 mg daily, Lasix 40 mg daily, potassium.  Patient was recently to begin cardiac and pulmonary rehab, she is followed by the heart failure clinic. -Continue bisoprolol 5 mg daily -Status post Lasix 40 mg IV twice daily on 1/4. -Continue 20 mg/day  torsemide -Strict I's and O's -Daily weights -Daily BMP, continue to monitor patient's creatinine  Hypokalemia Patient with potassium 3.4. -40 mg oral plasma given.  Congenital AV block s/p pacemaker Congenital heart block underwent first PPM at age 33 (mother had SLE). 2011 had echo with EF 35 to 40%.  The dysfunction was thought to be due to RV pacing so upgraded to CRT-P.  Patient follows with Dr. Ladona Ridgel in EP.  Last battery change was in 2004, according to notes patient is due for a battery change.  Lupus  Anti-Synthetase Syndrome (anti-Jo-1 antibody, interstitial lung disease, myositis, and elevated CK) Patient is followed by Maxie Barb at [facility] rheumatology.  CK is chronically elevated and is sensitive to steroids.  Home medications include CellCept, tacrolimus, prednisone 10 mg.   -Continue holding CellCept and tacrolimus at this time per recommendations of her rheumatologist -Hold prednisone for Decadron treatment  Interstitial lung disease Followed by Dr. Jetty Duhamel with [facility] pulmonology.  Former smoker.  Home medications include albuterol 2 puffs every 4 hours as needed, cromolyn, prednisone 10 mg daily, Breo Ellipta.  Patient is also on 2-4 L of oxygen via nasal cannula at baseline -Continue home medications  Type 2 diabetes CBGs over past 24 hours ranging from 139-288.  Last A1c 7.0 on October 03, 2019.  Home medications include Lantus 15 units nightly, Metformin 1000 mg twice daily, NovoLog 8 units 3 times daily before every meal.  Patient currently using 15 units of Lantus had used 20 units of fast acting insulin over last 24 hours. -Hold home medications -Sensitive sliding scale insulin -Increase to 20 units of Lantus  GERD  Home medications includes omeprazole. -Pantoprazole on formulary  HLD Last lipid profile was in 2016: Total cholesterol 200, HDL 31, LDL 136, triglycerides 163.  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 -Obtain lipid panel  FEN/GI: Heart healthy/carb modified PPx: Lovenox  Disposition: Pending improvement continue work-up for bacteremia  Subjective:  Patient states that she continues to feel well overall though she does endorse a slightly worsening cough and some increased congestion.  Patient continues on the same oxygen as she is at home, 3-4 L, currently on 3 L.  Objective: Temp:  [97.9 F (36.6 C)-98.4 F (36.9 C)] 97.9 F (36.6 C) (01/07 0423) Pulse Rate:  [84-106] 84 (01/07 0423) Resp:  [16-36] 16 (01/07 0423) BP: (90-105)/(56-72) 90/63 (01/07 0423) SpO2:  [97 %-100 %] 97 % (01/07 0423) Weight:  [64.1 kg] 64.1 kg (01/07 0423)  Physical Exam: General: Alert and oriented in no apparent distress Heart: Regular rate and rhythm with no murmurs appreciated Lungs: Lungs overall sound clear, breathing well on 3 L nasal cannula, some coughing noted during exam Abdomen: Bowel sounds present, no abdominal pain Skin: Warm and dry Extremities: No lower extremity edema   Laboratory: Recent Labs  Lab 10/25/19 0328 10/26/19 0634 10/27/19 0347  WBC 9.8 7.0 8.2  HGB 8.5* 8.3* 8.4*  HCT 30.0* 30.8* 30.0*  PLT 362 390 352   Recent Labs  Lab 10/25/19 0328 10/26/19 0634 10/27/19 0347  NA 136 138 137  K 4.0 4.5 3.4*  CL 101 101 99  CO2 25 25 26   BUN 24* 19 21*  CREATININE 1.02* 0.86 1.14*  CALCIUM 8.1* 8.3* 8.0*  PROT 6.8 6.7 6.9  BILITOT 0.7 0.3 0.5  ALKPHOS 60 55 56  ALT 11 11 9   AST 17 19 16   GLUCOSE 285* 293* 288*      Imaging/Diagnostic Tests:   Lurline Del, DO 10/27/2019, 6:07 AM PGY-1, [place] Intern pager: [phone], text pages welcome

## 2019-10-27 NOTE — Progress Notes (Signed)
Inpatient Diabetes Program Recommendations  AACE/ADA: New Consensus Statement on Inpatient Glycemic Control (2015)  Target Ranges:  Prepandial:   less than 140 mg/dL      Peak postprandial:   less than 180 mg/dL (1-2 hours)      Critically ill patients:  140 - 180 mg/dL   Lab Results  Component Value Date   GLUCAP 219 (H) 10/27/2019   HGBA1C 7.0 10/03/2019    Review of Glycemic Control Results for Makayla Vasquez, Makayla Vasquez (MRN [id number]) as of 10/27/2019 10:45  Ref. Range 10/27/2019 00:13 10/27/2019 04:21 10/27/2019 07:58  Glucose-Capillary Latest Ref Range: 70 - 99 mg/dL 356 (H) 701 (H) 410 (H)   Diabetes history:DM2 Outpatient Diabetes medications:Lantus 15 units daily, Novolog 8 units TID with meals, Metformin 1000 mg BID Current orders for Inpatient glycemic control:Lantus 15 units QHS, Novolog 0-9 units Q4H; Decadron 6 mg Q24H  Inpatient Diabetes Program Recommendations:  Consider increasing Lantus to 12 units BID.   Thanks, Lujean Rave, MSN, RNC-OB Diabetes Coordinator [phone] (8a-5p)

## 2019-10-27 NOTE — Progress Notes (Signed)
Patient ID: Makayla Vasquez, female   DOB: Feb 26, 1981, 39 y.o.   MRN: [id number]          [facility] for Infectious Disease    Date of Admission:  10/23/2019   Day 3 vancomycin, cefazolin and rifampin         Makayla Vasquez has Staph aureus bacteremia superimposed on COVID-19 infection.  Antibiotic susceptibilities of her isolate are still pending.  Once they are available we can narrow antibiotic therapy.  Repeat blood cultures are negative at 24 hours.  I would hold off on PICC placement until cultures are negative at 48 hours.  Given that she has a permanent pacemaker I would agree with TEE if cardiology feels comfortable with that recommendation.         Cliffton Asters, MD [facility] for Infectious Disease [facility] [phone] pager   [phone] cell 10/27/2019, 7:55 AM

## 2019-10-27 NOTE — Progress Notes (Signed)
Patient requested this AM Bisoprolol only 2.5 mg. She verbalized that this is her regular dose at home (BP 100/70). MD notified. Will continue to monitor.

## 2019-10-28 ENCOUNTER — Inpatient Hospital Stay: Payer: Self-pay

## 2019-10-28 DIAGNOSIS — R7881 Bacteremia: Secondary | ICD-10-CM

## 2019-10-28 DIAGNOSIS — B9561 Methicillin susceptible Staphylococcus aureus infection as the cause of diseases classified elsewhere: Secondary | ICD-10-CM

## 2019-10-28 LAB — CBC WITH DIFFERENTIAL/PLATELET
Abs Immature Granulocytes: 0.04 10*3/uL (ref 0.00–0.07)
Basophils Absolute: 0 10*3/uL (ref 0.0–0.1)
Basophils Relative: 0 %
Eosinophils Absolute: 0 10*3/uL (ref 0.0–0.5)
Eosinophils Relative: 1 %
HCT: 30.9 % — ABNORMAL LOW (ref 36.0–46.0)
Hemoglobin: 8.4 g/dL — ABNORMAL LOW (ref 12.0–15.0)
Immature Granulocytes: 1 %
Lymphocytes Relative: 19 %
Lymphs Abs: 1.3 10*3/uL (ref 0.7–4.0)
MCH: 20.2 pg — ABNORMAL LOW (ref 26.0–34.0)
MCHC: 27.2 g/dL — ABNORMAL LOW (ref 30.0–36.0)
MCV: 74.3 fL — ABNORMAL LOW (ref 80.0–100.0)
Monocytes Absolute: 0.6 10*3/uL (ref 0.1–1.0)
Monocytes Relative: 9 %
Neutro Abs: 4.8 10*3/uL (ref 1.7–7.7)
Neutrophils Relative %: 70 %
Platelets: 388 10*3/uL (ref 150–400)
RBC: 4.16 MIL/uL (ref 3.87–5.11)
RDW: 18.6 % — ABNORMAL HIGH (ref 11.5–15.5)
WBC: 6.8 10*3/uL (ref 4.0–10.5)
nRBC: 0 % (ref 0.0–0.2)

## 2019-10-28 LAB — C-REACTIVE PROTEIN: CRP: 0.7 mg/dL (ref <1.0)

## 2019-10-28 LAB — COMPREHENSIVE METABOLIC PANEL
ALT: 8 U/L (ref 0–44)
AST: 15 U/L (ref 15–41)
Albumin: 2.8 g/dL — ABNORMAL LOW (ref 3.5–5.0)
Alkaline Phosphatase: 60 U/L (ref 38–126)
Anion gap: 10 (ref 5–15)
BUN: 20 mg/dL (ref 6–20)
CO2: 25 mmol/L (ref 22–32)
Calcium: 8 mg/dL — ABNORMAL LOW (ref 8.9–10.3)
Chloride: 99 mmol/L (ref 98–111)
Creatinine, Ser: 0.9 mg/dL (ref 0.44–1.00)
GFR calc Af Amer: >60 mL/min (ref >60)
GFR calc non Af Amer: >60 mL/min (ref >60)
Glucose, Bld: 206 mg/dL — ABNORMAL HIGH (ref 70–99)
Potassium: 4.1 mmol/L (ref 3.5–5.1)
Sodium: 134 mmol/L — ABNORMAL LOW (ref 135–145)
Total Bilirubin: 0.5 mg/dL (ref 0.3–1.2)
Total Protein: 6.7 g/dL (ref 6.5–8.1)

## 2019-10-28 LAB — CULTURE, BLOOD (ROUTINE X 2)
Culture: NO GROWTH
Special Requests: ADEQUATE

## 2019-10-28 LAB — GLUCOSE, CAPILLARY
Glucose-Capillary: 187 mg/dL — ABNORMAL HIGH (ref 70–99)
Glucose-Capillary: 235 mg/dL — ABNORMAL HIGH (ref 70–99)
Glucose-Capillary: 152 mg/dL — ABNORMAL HIGH (ref 70–99)
Glucose-Capillary: 396 mg/dL — ABNORMAL HIGH (ref 70–99)
Glucose-Capillary: 216 mg/dL — ABNORMAL HIGH (ref 70–99)
Glucose-Capillary: 238 mg/dL — ABNORMAL HIGH (ref 70–99)

## 2019-10-28 LAB — D-DIMER, QUANTITATIVE: D-Dimer, Quant: 0.71 ug/mL-FEU — ABNORMAL HIGH (ref 0.00–0.50)

## 2019-10-28 NOTE — Progress Notes (Signed)
Family Medicine Teaching Service Daily Progress Note Intern Pager: [phone]  Patient name: Makayla Vasquez Medical record number: [id number] Date of birth: 08/02/1981 Age: 39 y.o. Gender: female  Primary Care Provider: Nuala Alpha, DO Consultants: None Code Status: Full  Pt Overview and Major Events to Date:  1/4-admitted  Assessment and Plan:  COVID-19  acute on chronic respiratory failure  hypoxemia CRP improved 0.7 from 2.8.  White blood cell count 6.8.  Currently satting well on 3 L nasal cannula.  Home requirement of 2-4 L.   -Continue airborne and contact precautions -Continuous cardiac monitoring pulse ox -Remdesivir infusion per pharmacy for 5 days, day 5 of 5 -Decadron 6 mg p.o. daily for 10 days, day 5 of 10 -Maintain O2 sat greater than 90% via oxygen therapy -Heart healthy/carb modified diet -Monitor CRP, BMP, CBC  Bacteremia Patient with 1/4 blood culture positive for pansensitive staph aureus. Echo showed 35-40% ejection fraction.  Repeat blood culture shows no growth at 2 days.  TTE did not show any signs of vegetations. -Infectious disease on board, appreciate recommendations  -TTE complete, TEE ordered.  If TEE is not to be performed today we will give patient a diet.  -Patient status post 1 day vancomycin and rifampin as well as cefazolin.  -Continue cefazolin  HFrEF Patient states she believes that she is close to her baseline weight at this time.  Patient's weight on presentation 152 pounds, currently 144 pounds.  Patient's creatinine is also returned to 0.9.  Echo performed 10/26/2019 showed ejection fraction of 30-40% with grade 1 diastolic dysfunction.  Echo 11/2018 EF 35-40% with mild RV systolic dysfunction. NYHA IIIb chronically.  On home oxygen. Home medications include bisoprolol 5 mg daily, Lasix 40 mg daily, potassium.  Patient was recently to begin cardiac and pulmonary rehab, she is followed by the heart failure clinic. -Continue bisoprolol 5 mg  daily -Status post Lasix 40 mg IV twice daily on 1/4. -Continue 20 mg/day torsemide -Strict I's and O's -Daily weights -Daily BMP, continue to monitor patient's creatinine  Hypokalemia, resolved Current potassium 4.1.  Congenital AV block s/p pacemaker Congenital heart block underwent first PPM at age 68 (mother had SLE). 2011 had echo with EF 35 to 40%.  The dysfunction was thought to be due to RV pacing so upgraded to CRT-P.  Patient follows with Dr. Lovena Le in EP.  Last battery change was in 2004, according to notes patient is due for a battery change.  Lupus  Anti-Synthetase Syndrome (anti-Jo-1 antibody, interstitial lung disease, myositis, and elevated CK) Patient is followed by Tonna Corner at [facility] rheumatology.  CK is chronically elevated and is sensitive to steroids.  Home medications include CellCept, tacrolimus, prednisone 10 mg.   -Continue holding CellCept and tacrolimus at this time per recommendations of her rheumatologist -Hold prednisone for Decadron treatment  Interstitial lung disease Followed by Dr. Baird Lyons with [facility] pulmonology.  Former smoker.  Home medications include albuterol 2 puffs every 4 hours as needed, cromolyn, prednisone 10 mg daily, Breo Ellipta.  Patient is also on 2-4 L of oxygen via nasal cannula at baseline -Continue home medications  Type 2 diabetes CBGs over past 24 hours ranging from 152-235. Last A1c 7.0 on October 03, 2019.  Home medications include Lantus 15 units nightly, Metformin 1000 mg twice daily, NovoLog 8 units 3 times daily before every meal.  Patient currently using 20 units of Lantus had used 11 units of fast acting insulin over last 24 hours. -Hold home medications -  Sensitive sliding scale insulin -Continue 20 units Lantus per day  GERD Home medications includes omeprazole. -Pantoprazole on formulary  HLD Last lipid profile was in 2016: Total cholesterol 200, HDL 31, LDL 136, triglycerides 163.  Patient is not  on a statin due to intolerance, as well as antisynthetase syndrome with elevated CK and myopathy. -Obtain lipid panel  FEN/GI: N.p.o. pending possible TEE PPx: Lovenox  Disposition: Pending improvement continue work-up for bacteremia  Subjective:  Patient states she feels close to her baseline at this time.  She denies pains or complaints and states that she is overall feeling better.  She states her cough is somewhat improved compared to yesterday.  Objective: Temp:  [97.3 F (36.3 C)-98.6 F (37 C)] 98.2 F (36.8 C) (01/08 0405) Pulse Rate:  [79-98] 98 (01/08 0433) Resp:  [17-31] 17 (01/08 0433) BP: (84-103)/(55-71) 84/55 (01/08 0405) SpO2:  [93 %-100 %] 95 % (01/08 0433) Weight:  [65.4 kg] 65.4 kg (01/08 0405)  Physical Exam: General: Alert and oriented in no apparent distress Heart: Regular rate and rhythm with no murmurs appreciated Lungs: CTA bilaterally Abdomen: Bowel sounds present, no abdominal pain Skin: Warm and dry Extremities: No lower extremity edema  Laboratory: Recent Labs  Lab 10/25/19 0328 10/26/19 0634 10/27/19 0347  WBC 9.8 7.0 8.2  HGB 8.5* 8.3* 8.4*  HCT 30.0* 30.8* 30.0*  PLT 362 390 352   Recent Labs  Lab 10/25/19 0328 10/26/19 0634 10/27/19 0347  NA 136 138 137  K 4.0 4.5 3.4*  CL 101 101 99  CO2 25 25 26   BUN 24* 19 21*  CREATININE 1.02* 0.86 1.14*  CALCIUM 8.1* 8.3* 8.0*  PROT 6.8 6.7 6.9  BILITOT 0.7 0.3 0.5  ALKPHOS 60 55 56  ALT 11 11 9   AST 17 19 16   GLUCOSE 285* 293* 288*      Imaging/Diagnostic Tests:   , DO 10/28/2019, 6:09 AM PGY-1, [facility] Family Medicine FPTS Intern pager: [phone], text pages welcome

## 2019-10-28 NOTE — Progress Notes (Addendum)
Patient ID: Makayla Vasquez, female   DOB: 14-Feb-1981, 39 y.o.   MRN: [id number]          [facility]    Date of Admission:  10/23/2019   Day 4 cefazolin and rifampin         Ms. Denicola had transient MSSA bacteremia superimposed on COVID-19 infection.  Repeat blood cultures are negative at 48 hours so PICC placement can be performed at any time..   Given that she has a permanent pacemaker I would agree with TEE if cardiology feels comfortable with that recommendation.  Please call me for any infectious disease questions this weekend.         Cliffton Asters, MD [facility] for Infectious Disease [facility] [phone] pager   [phone] cell 10/28/2019, 11:20 AM

## 2019-10-28 NOTE — Consult Note (Addendum)
Cardiology Consultation:   Patient ID: HETTY LINHART MRN: [id number]; DOB: [birth year]  Admit date: 10/23/2019 Date of Consult: 10/28/2019  Primary Care Provider: Nuala Alpha, DO Primary Cardiologist: Dr. Aundra Dubin Primary Electrophysiologist:  Dr. Lovena Le   Patient Profile:   Makayla Vasquez is a 39 y.o. female with a hx of interstitial lung disease with an anti-synthetase syndrome chronically on O2, RA, SLE, congenital CHB, NICM, w/CRT-PPM, DM,  who is being seen today for the evaluation of bacteremia/cardiac device in place at the request of Dr. Lenice Llamas.   Device information MDT CRT-P, 03/08/2012 RA lead intermedics 438-10, implanted 08/05/1996 RV lead intermedics 438-10, implanted 08/05/1996 LV lead MDT [id number], implanted 03/08/2012   History of Present Illness:   Ms. Nealis was admitted to [facility] with about a month of worse then usual SOB though acutely worse about 2 days prior to her admission, she was found to be COVID +, given her high risk of complications, started on  decadron and remdesovir, though also felt to have acute/chronic CHF and started on diuresis as well  She has been respiratory stable on her home O2 setting  One of 2 BC with Staph aureus bacteremia superimposed on COVID-19 infection, ID is seeing her, sensitivities pending, repeat blood cultures and getting TTE, on vancomycin, cefazolin and rifampin  EP is asked to the case given PPM and her bacteremia  LABS (most recent) K+ 4.1 BUN/Creat 20/0.90 LFTs wnl CRP 1.8 > 2.8 > 1.2 > 0.9 > 0.7 Ddimer 1.75 > 0.71 WBC 6.8 H/H 8.4/30.9 Plts 388  afebrile No outputs recorded Weight down 4kg from admit weight s/p IV lasix >> PO torsemide VSS, last BP 94/64 (looks about her baseline), 93-97% on 3L n/c  I called the patient in her room via telephone The patient reports feeling not quite back to her baseline respiratory status She is feeling better though No CP, she has some reports of occ fleeting palpitations over the  years, none here, no CP No dizy spells, near syncope or syncope    Heart Pathway Score:     Past Medical History:  Diagnosis Date  . Anxiety   . Arthritis    rheumatoid arthritis- mild, no rheumatology care   . Asthma    seasonal allergies   . Asymptomatic LV dysfunction    a. Echo in Dec 2011 with EF 35 to 40%. Felt to be due to paced rhythm. b. EF 25-30% in 07/2014.  . Bipolar affective disorder ([place])   . Cardiac pacemaker    a. Since age 51 in 58. b. Upgrade to BiV in 2013.  [name] Carpal tunnel syndrome of right wrist   . CHF (congestive heart failure) ([place])   . Congenital complete AV block   . Cor pulmonale, chronic ([place]) 04/08/2018  . Depression    bipolar  . Diabetes mellitus without complication ([place])   . GERD (gastroesophageal reflux disease)   . Hypertension   . Interstitial lung disease ([place])   . Lupus ([place])   . Lupus (systemic lupus erythematosus) ([place])   . Obesity   . Pneumonitis    a. a/w hypoxia - inflammatory - large workup 07/2014.  . Presence of permanent cardiac pacemaker   . Seizures ([place])    as a child- from high fever  . Sinus tachycardia     Past Surgical History:  Procedure Laterality Date  . ATRIAL TACH ABLATION N/A 08/14/2014   Procedure: ATRIAL TACH ABLATION;  Surgeon: [name], MD;  Location: [facility] CATH LAB;  Service: Cardiovascular;  Laterality: N/A;  . BI-VENTRICULAR PACEMAKER UPGRADE N/A 03/08/2012   Procedure: BI-VENTRICULAR PACEMAKER UPGRADE;  Surgeon: [name], MD;  Location: [facility] CATH LAB;  Service: Cardiovascular;  Laterality: N/A;  . CARPAL TUNNEL WITH CUBITAL TUNNEL Right 07/26/2013   Procedure: RIGHT LIMITED OPEN CARPAL TUNNEL RELEASE ,  RIGHT CUBITAL TUNNEL RELEASE, INSITU VERSES ULNAR NERVE DECOMPRESSION AND ANTERIOR TRANSPOSITION;  Surgeon: [name], MD;  Location: [place];  Service: Orthopedics;  Laterality: Right;  . CESAREAN SECTION    . CHOLECYSTECTOMY    . CYST EXCISION  12/10/2012   THYROID  . INSERT / REPLACE /  REMOVE PACEMAKER     2001  . IUD REMOVAL  11/03/2011   Procedure: INTRAUTERINE DEVICE (IUD) REMOVAL;  Surgeon: [name], MD;  Location: [facility];  Service: Gynecology;  Laterality: N/A;  . NASAL FRACTURE SURGERY     /w plate   . RIGHT HEART CATHETERIZATION N/A 10/26/2014   Procedure: RIGHT HEART CATH;  Surgeon: [name], MD;  Location: [facility] CATH LAB;  Service: Cardiovascular;  Laterality: N/A;  . THROAT SURGERY  1994   s/p laser treatment  . THYROGLOSSAL DUCT CYST N/A 12/10/2012   Procedure: REVISION OF THYROGLOSSAL DUCT CYST EXCISION;  Surgeon: [name], MD;  Location: [place];  Service: ENT;  Laterality: N/A;  Revision of Thyroglossal Duct Cyst Excision  . VIDEO BRONCHOSCOPY Bilateral 06/19/2014   Procedure: VIDEO BRONCHOSCOPY WITHOUT FLUORO;  Surgeon: Brand Males, MD;  Location: [facility];  Service: Cardiopulmonary;  Laterality: Bilateral;     Home Medications:  Prior to Admission medications   Medication Sig Start Date End Date Taking? Authorizing Provider  albuterol (VENTOLIN HFA) 108 (90 Base) MCG/ACT inhaler Inhale 2 puffs into the lungs every 4 (four) hours as needed for wheezing or shortness of breath. 02/25/19  Yes Tammi Klippel, Sherin, DO  bisoprolol (ZEBETA) 5 MG tablet Take 1 tablet (5 mg total) by mouth daily. 08/09/19  Yes Clegg, Amy D, NP  dextromethorphan-guaiFENesin (ROBITUSSIN-DM) 10-100 MG/5ML liquid TAKE 5 MLS BY MOUTH EVERY 4 (FOUR) HOURS AS NEEDED FOR COUGH. Patient taking differently: Take 5 mLs by mouth every 4 (four) hours as needed for cough.  10/27/18  Yes [place] Bing, DO  Etonogestrel (NEXPLANON [place]) Inject 1 Device into the skin once. Every 3 years   Yes [provider]  ferrous sulfate 325 (65 FE) MG EC tablet Take 1 tablet (325 mg total) by mouth daily with breakfast. 08/11/19 02/07/20 Yes Lockamy, Timothy, DO  furosemide (LASIX) 40 MG tablet TAKE 1 TABLET BY MOUTH EVERY DAY Patient taking differently: Take 40 mg by mouth daily.  10/17/19   Yes [name], MD  LANTUS SOLOSTAR 100 UNIT/ML Solostar Pen INJECT [place] DAY Patient taking differently: Inject 15 Units into the skin daily.  01/05/19  Yes [place] Bing, DO  loratadine (CLARITIN) 10 MG tablet Take 1 tablet (10 mg total) by mouth daily. 08/15/19  Yes Young, Tarri Fuller D, MD  metFORMIN (GLUCOPHAGE) 1000 MG tablet TAKE 1 TABLET BY MOUTH TWICE DAILY WITH A MEAL Patient taking differently: Take 1,000 mg by mouth 2 (two) times daily with a meal.  03/22/19  Yes McMullen, David J, DO  NOVOLOG FLEXPEN 100 UNIT/ML FlexPen INJECT 8 UNITS INTO THE SKIN 3 TIMES DAILY WITH MEALS. Patient taking differently: Inject 8 Units into the skin 3 (three) times daily with meals.  11/02/18  Yes [place] Bing, DO  omeprazole (PRILOSEC) 20 MG capsule TAKE  [place] DAY Patient taking differently: Take 20 mg by mouth daily.  05/02/19  Yes Young, Tarri Fuller D, MD  OXYGEN Inhale 2-4 L into the lungs continuous.    Yes [provider]  potassium chloride SA (K-DUR,KLOR-CON) 20 MEQ tablet Take 1 tablet (20 mEq total) by mouth daily. 02/01/19  Yes Bensimhon, Shaune Pascal, MD  predniSONE (DELTASONE) 10 MG tablet Take 1 tablet (10 mg total) by mouth daily with breakfast. 09/08/19  Yes Young, Clinton D, MD  tacrolimus (PROGRAF) 0.5 MG capsule Take 1.5 mg by mouth 2 (two) times daily. 11/27/17  Yes [provider]  traZODone (DESYREL) 50 MG tablet TAKE 1 TABLET (50 MG TOTAL) BY MOUTH AT BEDTIME AS NEEDED FOR SLEEP. 09/27/18  Yes [place] Bing, DO  Alcohol Swabs (B-D SINGLE USE SWABS REGULAR) PADS 1 application by Does not apply route 4 (four) times daily. 03/15/19   [place] Bing, DO  benzonatate (TESSALON PERLES) 100 MG capsule Take 1 capsule (100 mg total) by mouth 3 (three) times daily as needed for cough. Patient not taking: Reported on 10/24/2019 10/17/19   Stark Klein, MD  Blood Glucose Calibration (ACCU-CHEK AVIVA) SOLN 1 application by In Vitro  route 4 (four) times daily. 03/18/19   [place] Bing, DO  blood glucose meter kit and supplies Dispense based on patient and insurance preference. Use up to four times daily as directed. (FOR ICD-10 E10.9, E11.9). 03/18/19   [place] Bing, DO  cromolyn (NASALCROM) 5.2 MG/ACT nasal spray Place 1 spray into both nostrils 4 (four) times daily. Patient not taking: Reported on 10/24/2019 08/15/19   Baird Lyons D, MD  fluticasone furoate-vilanterol (BREO ELLIPTA) 100-25 MCG/INH AEPB Inhale 1 puff into the lungs daily. Patient not taking: Reported on 10/24/2019 06/07/19   Baird Lyons D, MD  Fluticasone-Umeclidin-Vilant (TRELEGY ELLIPTA) 100-62.5-25 MCG/INH AEPB Inhale 1 puff into the lungs daily. Patient not taking: Reported on 08/15/2019 07/19/19   Baird Lyons D, MD  mycophenolate (CELLCEPT) 500 MG tablet Take 1,000 mg by mouth 2 (two) times daily.     [provider]    Inpatient Medications: Scheduled Meds: . bisoprolol  5 mg Oral Daily  . dexamethasone  6 mg Oral Q24H  . enoxaparin (LOVENOX) injection  40 mg Subcutaneous Daily  . ferrous sulfate  325 mg Oral Q breakfast  . fluticasone furoate-vilanterol  1 puff Inhalation Daily  . insulin aspart  0-9 Units Subcutaneous TID WC & HS  . insulin glargine  20 Units Subcutaneous QHS  . loratadine  10 mg Oral Daily  . pantoprazole  40 mg Oral Daily  . rifampin  600 mg Oral Daily  . torsemide  20 mg Oral Daily   Continuous Infusions: .  ceFAZolin (ANCEF) IV 2 g (10/28/19 0543)  . remdesivir 100 mg in NS 100 mL 100 mg (10/27/19 1015)   PRN Meds: acetaminophen, albuterol, guaiFENesin, polyethylene glycol, traZODone  Allergies:    Allergies  Allergen Reactions  . 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 Sertraline Hcl Hives  . Tape Other (See Comments)    "Burns" the skin    Social History:   Social History   Socioeconomic History  . Marital status:  Single    Spouse name: Not on file  . Number of children: 1  . Years of education: Not on file  . Highest education level: Not on file  Occupational History  .  Occupation: CNA  Tobacco Use  . Smoking status: Former Smoker    Packs/day: 0.25    Years: 0.50    Pack years: 0.12    Types: Cigarettes    Quit date: 07/25/1996    Years since quitting: 23.2  . Smokeless tobacco: Never Used  Substance and Sexual Activity  . Alcohol use: No  . Drug use: No  . Sexual activity: Yes    Birth control/protection: Implant  Other Topics Concern  . Not on file  Social History Narrative   Works at [employer].     Social Determinants of Health   Financial Resource Strain:   . Difficulty of Paying Living Expenses: Not on file  Food Insecurity:   . Worried About Charity fundraiser in the Last Year: Not on file  . Ran Out of Food in the Last Year: Not on file  Transportation Needs:   . Lack of Transportation (Medical): Not on file  . Lack of Transportation (Non-Medical): Not on file  Physical Activity:   . Days of Exercise per Week: Not on file  . Minutes of Exercise per Session: Not on file  Stress:   . Feeling of Stress : Not on file  Social Connections:   . Frequency of Communication with Friends and Family: Not on file  . Frequency of Social Gatherings with Friends and Family: Not on file  . Attends Religious Services: Not on file  . Active Member of Clubs or Organizations: Not on file  . Attends Archivist Meetings: Not on file  . Marital Status: Not on file  Intimate Partner Violence:   . Fear of Current or Ex-Partner: Not on file  . Emotionally Abused: Not on file  . Physically Abused: Not on file  . Sexually Abused: Not on file    Family History:    Family History  Problem Relation Age of Onset  . Heart disease Mother        CHF (no details)  . Hypertension Mother   . Lupus Mother   . Heart disease Father        Murmur  . Heart disease Sister 40        No  details.  History of a pacemaker     ROS:  Please see the history of present illness.  All other ROS reviewed and negative.     Physical Exam/Data:   Vitals:   10/27/19 1854 10/27/19 2030 10/28/19 0405 10/28/19 0433  BP:  94/65 (!) 84/55   Pulse: 97 96 86 98  Resp: (!) 24 20 (!) 23 17  Temp:  (!) 97.3 F (36.3 C) 98.2 F (36.8 C)   TempSrc:  Oral Oral   SpO2: 93% 98% 97% 95%  Weight:   65.4 kg   Height:        Intake/Output Summary (Last 24 hours) at 10/28/2019 0848 Last data filed at 10/28/2019 0820 Gross per 24 hour  Intake 380 ml  Output --  Net 380 ml   Last 3 Weights 10/28/2019 10/27/2019 10/26/2019  Weight (lbs) 144 lb 2.9 oz 141 lb 5 oz 142 lb 3.2 oz  Weight (kg) 65.4 kg 64.1 kg 64.5 kg     Body mass index is 23.99 kg/m.  Given COVID positive, I called  The patient on the phone, she confirms her identity by name and birth date She speaks in full sentences at a normal pace, he does not sound SOB   EKG:  The EKG was personally  reviewed and demonstrates:    ST 122bpm, V paced  Telemetry:  Telemetry was personally reviewed and demonstrates:   SR, V paced, generally 70's-80's    Relevant CV Studies:   10/26/2019: TTE IMPRESSIONS 1. Left ventricular ejection fraction, by visual estimation, is 35 to 40%. The left ventricle has moderately decreased function. There is no left ventricular hypertrophy.  2. Left ventricular diastolic parameters are consistent with Grade I diastolic dysfunction (impaired relaxation).  3. The left ventricle demonstrates global hypokinesis.  4. Global right ventricle has normal systolic function.The right ventricular size is normal. No increase in right ventricular wall thickness.  5. Left atrial size was normal.  6. Right atrial size was not well visualized.  7. The mitral valve is normal in structure. Mild mitral valve regurgitation. No evidence of mitral stenosis.  8. The tricuspid valve is grossly normal.  9. The aortic valve is normal  in structure. Aortic valve regurgitation is not visualized. 10. Pulmonic regurgitation is mild. 11. The pulmonic valve was grossly normal. Pulmonic valve regurgitation is mild. 12. Moderately elevated pulmonary artery systolic pressure. 13. A pacer wire is visualized. 14. The atrial septum is grossly normal.    Laboratory Data:  High Sensitivity Troponin:  No results for input(s): TROPONINIHS in the last 720 hours.   Chemistry Recent Labs  Lab 10/26/19 0634 10/27/19 0347 10/28/19 0500  NA 138 137 134*  K 4.5 3.4* 4.1  CL 101 99 99  CO2 _0 GLUCOSE 293* 288* 206*  BUN 19 21* 20  CREATININE 0.86 1.14* 0.90  CALCIUM 8.3* 8.0* 8.0*  GFRNONAA >60 >60 >60  GFRAA >60 >60 >60  ANIONGAP _1 Recent Labs  Lab 10/26/19 0634 10/27/19 0347 10/28/19 0500  PROT 6.7 6.9 6.7  ALBUMIN 2.9* 3.0* 2.8*  AST _2 ALT _3 ALKPHOS 55 56 60  BILITOT 0.3 0.5 0.5   Hematology Recent Labs  Lab 10/26/19 0634 10/27/19 0347 10/28/19 0500  WBC 7.0 8.2 6.8  RBC 4.14 4.11 4.16  HGB 8.3* 8.4* 8.4*  HCT 30.8* 30.0* 30.9*  MCV 74.4* 73.0* 74.3*  MCH 20.0* 20.4* 20.2*  MCHC 26.9* 28.0* 27.2*  RDW 18.5* 18.3* 18.6*  PLT 390 352 388   BNP Recent Labs  Lab 10/23/19 2300  BNP 133.7*    DDimer  Recent Labs  Lab 10/23/19 2300 10/28/19 0500  DDIMER 1.75* 0.71*     Radiology/Studies:   {  Assessment and Plan:   1. CRT-P in setting of staph aureus bacteremia     Her last in-clinic devic check was Jun 2019, at that visit she had an escape rhythm at 48bpm     100% VP on remotes (BiVePaced)     Congential CHB is pacing indication     RA/RV leads are from 1997, LV lead and generator are from 2013  Timeline for device extraction would be next week, once farther out from her COVID + test date No TEE yet, will wait to discuss/see what ID recommendations today are.  Dr. Lovena Le to see the patient later today  For questions or updates, please contact [facility] Please consult [email or web address] for contact info under   Signed, Baldwin Jamaica, PA-C  10/28/2019 8:48 AM  EP Attending  Patient is well known to me from multiple clinic visits over the years and an upgrade for a DDD PM to a biv PM in 2013. She has 40 year old A/V  leads. Note 1/2 cultures positive for staph but no fever, elevated WBC or peripheral stigmata of SBE. I agree with TEE (despite Covid) as it will affect our management. If TEE shows no vegetation, then in light of all of the above and significant risk in removing her device, I would treat with 2 weeks of antibiotics and then observe. If TEE shows a vegetation then removal of the device is warranted.   Mikle Bosworth.D.

## 2019-10-29 LAB — C-REACTIVE PROTEIN: CRP: 1.3 mg/dL — ABNORMAL HIGH (ref <1.0)

## 2019-10-29 LAB — GLUCOSE, CAPILLARY
Glucose-Capillary: 123 mg/dL — ABNORMAL HIGH (ref 70–99)
Glucose-Capillary: 141 mg/dL — ABNORMAL HIGH (ref 70–99)
Glucose-Capillary: 167 mg/dL — ABNORMAL HIGH (ref 70–99)
Glucose-Capillary: 230 mg/dL — ABNORMAL HIGH (ref 70–99)
Glucose-Capillary: 252 mg/dL — ABNORMAL HIGH (ref 70–99)
Glucose-Capillary: 282 mg/dL — ABNORMAL HIGH (ref 70–99)
Glucose-Capillary: 78 mg/dL (ref 70–99)

## 2019-10-29 LAB — COMPREHENSIVE METABOLIC PANEL
ALT: 9 U/L (ref 0–44)
AST: 21 U/L (ref 15–41)
Albumin: 3.3 g/dL — ABNORMAL LOW (ref 3.5–5.0)
Alkaline Phosphatase: 66 U/L (ref 38–126)
Anion gap: 13 (ref 5–15)
BUN: 21 mg/dL — ABNORMAL HIGH (ref 6–20)
CO2: 29 mmol/L (ref 22–32)
Calcium: 8.3 mg/dL — ABNORMAL LOW (ref 8.9–10.3)
Chloride: 95 mmol/L — ABNORMAL LOW (ref 98–111)
Creatinine, Ser: 1 mg/dL (ref 0.44–1.00)
GFR calc Af Amer: >60 mL/min (ref >60)
GFR calc non Af Amer: >60 mL/min (ref >60)
Glucose, Bld: 151 mg/dL — ABNORMAL HIGH (ref 70–99)
Potassium: 3.9 mmol/L (ref 3.5–5.1)
Sodium: 137 mmol/L (ref 135–145)
Total Bilirubin: 0.6 mg/dL (ref 0.3–1.2)
Total Protein: 7.4 g/dL (ref 6.5–8.1)

## 2019-10-29 LAB — CBC WITH DIFFERENTIAL/PLATELET
Abs Immature Granulocytes: 0.03 10*3/uL (ref 0.00–0.07)
Basophils Absolute: 0 10*3/uL (ref 0.0–0.1)
Basophils Relative: 0 %
Eosinophils Absolute: 0.1 10*3/uL (ref 0.0–0.5)
Eosinophils Relative: 2 %
HCT: 35.1 % — ABNORMAL LOW (ref 36.0–46.0)
Hemoglobin: 9.7 g/dL — ABNORMAL LOW (ref 12.0–15.0)
Immature Granulocytes: 1 %
Lymphocytes Relative: 29 %
Lymphs Abs: 1.6 10*3/uL (ref 0.7–4.0)
MCH: 20.5 pg — ABNORMAL LOW (ref 26.0–34.0)
MCHC: 27.6 g/dL — ABNORMAL LOW (ref 30.0–36.0)
MCV: 74.1 fL — ABNORMAL LOW (ref 80.0–100.0)
Monocytes Absolute: 0.5 10*3/uL (ref 0.1–1.0)
Monocytes Relative: 8 %
Neutro Abs: 3.4 10*3/uL (ref 1.7–7.7)
Neutrophils Relative %: 60 %
Platelets: UNDETERMINED 10*3/uL (ref 150–400)
RBC: 4.74 MIL/uL (ref 3.87–5.11)
RDW: 18.6 % — ABNORMAL HIGH (ref 11.5–15.5)
WBC: 5.7 10*3/uL (ref 4.0–10.5)
nRBC: 0 % (ref 0.0–0.2)

## 2019-10-29 MED ORDER — SODIUM CHLORIDE 0.9% FLUSH: 10.0000 mL | INTRAVENOUS | Status: DC | PRN

## 2019-10-29 MED ORDER — CHLORHEXIDINE GLUCONATE CLOTH 2 % EX PADS
6.0000 | MEDICATED_PAD | Freq: Every day | CUTANEOUS | Status: DC
  Administered 2019-10-30 – 2019-11-02 (×4): 6 via TOPICAL

## 2019-10-29 MED ORDER — BISOPROLOL FUMARATE 5 MG PO TABS
5.0000 mg | ORAL_TABLET | Freq: Every evening | ORAL | Status: DC
  Administered 2019-10-30 – 2019-11-01 (×2): 5 mg via ORAL
  Filled 2019-10-29 (×5): qty 1

## 2019-10-29 MED ORDER — INSULIN GLARGINE 100 UNIT/ML ~~LOC~~ SOLN
15.0000 [IU] | Freq: Every day | SUBCUTANEOUS | Status: DC
  Administered 2019-10-29 – 2019-11-01 (×4): 15 [IU] via SUBCUTANEOUS
  Filled 2019-10-29 (×5): qty 0.15

## 2019-10-29 NOTE — Progress Notes (Signed)
OT Cancellation Note  Patient Details Name: Makayla Vasquez MRN: [id number] DOB: 07-Oct-1981   Cancelled Treatment:    Reason Eval/Treat Not Completed: Fatigue/lethargy limiting ability to participate.  Attempted x 2, but pt adamantly refused despite max effort and explanation of benefit as well as consequences of immobility.  First attempt, she stated that she doesn't get up until 1300 and it was too early (1209), and second attempt she states she sat up ~1 hour previously became dizzy and nauseated.  Will continue attempts.  Eber Jones., OTR/L Acute Rehabilitation Services Pager [phone] Office [phone]   Jeani Hawking M 10/29/2019, 2:03 PM

## 2019-10-29 NOTE — Progress Notes (Signed)
PT Cancellation Note  Patient Details Name: Makayla Vasquez MRN: [id number] DOB: [birth year]   Cancelled Treatment:    Reason Eval/Treat Not Completed: Fatigue/lethargy limiting ability to participate  Discussed with OT pt's refusal. She is adamant at this time that she is not going to participate with therapy today. Will reattempt 10/30/19   Jerolyn Center, PT Pager [phone]  Makayla Vasquez 10/29/2019, 2:20 PM

## 2019-10-29 NOTE — Progress Notes (Signed)
Patient ID: Makayla Vasquez, female   DOB: Jun 22, 1981, 39 y.o.   MRN: [id number]   EP Attending  Chart reviewed. Note ID service and medical teams notes. As she has done well, agree with plans as above. TEE and if negative and no peripheral stigmata of SBE or recurrent bacteremia, I would recommend medical therapy with IV anti-biotics under the ID service direction. If she has evidence of a vegetation on TEE, then PM system extraction is warranted, albeit and increased risk with her 40 year old pacing leads.   Leonia Reeves.D.

## 2019-10-29 NOTE — Progress Notes (Signed)
Family Medicine Teaching Service Daily Progress Note Intern Pager: [phone]  Patient name: Makayla Vasquez Medical record number: [id number] Date of birth: 07-22-1981 Age: 39 y.o. Gender: female  Primary Care Provider: Arlyce Harman, DO Consultants: None Code Status: Full  Pt Overview and Major Events to Date:  1/4-admitted  Assessment and Plan:  COVID-19  acute on chronic respiratory failure  hypoxemia CRP currently 1.3.  White blood cell count 5.7.  Currently satting well on 3 L nasal cannula.  Home requirement of 2-4 L.   -Continue airborne and contact precautions -Continuous cardiac monitoring pulse ox -Status post 5 days remdesivir therapy. -Decadron 6 mg p.o. daily for 10 days, day 6 of 10 -Maintain O2 sat greater than 90% via oxygen therapy -Heart healthy/carb modified diet -Monitor CRP, BMP, CBC  Bacteremia Patient with 1/4 blood culture positive for pansensitive staph aureus. Echo showed 35-40% ejection fraction.  Repeat blood culture shows no growth at 2 days.  TTE did not show any signs of vegetations.  Per ID will place PICC for long-term antibiotics.  TEE to be done after resolution of COVID-19, if TEE shows vegetation then patient would likely need device extraction. -Infectious disease on board, appreciate recommendations  -Patient status post 1 day vancomycin, 2 days of rifampin as well as cefazolin.  -Continue cefazolin  -Plan to discuss with infectious disease whether patient's rifampin can be discontinued at this point as her MSSA is pansensitive.  HFrEF Patient states she believes that she is close to her baseline weight at this time.  Patient's weight on presentation 152 pounds, currently 144 pounds.  Patient's creatinine is also returned to 0.9.  Echo performed 10/26/2019 showed ejection fraction of 30-40% with grade 1 diastolic dysfunction.  Echo 11/2018 EF 35-40% with mild RV systolic dysfunction. NYHA IIIb chronically.  On home oxygen. Home medications  include bisoprolol 5 mg daily, Lasix 40 mg daily, potassium.  Patient was recently to begin cardiac and pulmonary rehab, she is followed by the heart failure clinic. -Continue bisoprolol 5 mg daily -Status post Lasix 40 mg IV twice daily on 1/4. -Continue 20 mg/day torsemide -Strict I's and O's -Daily weights -Daily BMP, continue to monitor patient's creatinine  Hypokalemia, resolved Current potassium 3.9.  Congenital AV block s/p pacemaker Congenital heart block underwent first PPM at age 89 (mother had SLE). 2011 had echo with EF 35 to 40%.  The dysfunction was thought to be due to RV pacing so upgraded to CRT-P.  Patient follows with Dr. Ladona Ridgel in EP.  Last battery change was in 2004, according to notes patient is due for a battery change.  Lupus  Anti-Synthetase Syndrome (anti-Jo-1 antibody, interstitial lung disease, myositis, and elevated CK) Patient is followed by Maxie Barb at [facility] rheumatology.  CK is chronically elevated and is sensitive to steroids.  Home medications include CellCept, tacrolimus, prednisone 10 mg.   -Continue holding CellCept and tacrolimus at this time per recommendations of her rheumatologist -Hold prednisone for Decadron treatment  Interstitial lung disease Followed by Dr. Jetty Duhamel with [facility] pulmonology.  Former smoker.  Home medications include albuterol 2 puffs every 4 hours as needed, cromolyn, prednisone 10 mg daily, Breo Ellipta.  Patient is also on 2-4 L of oxygen via nasal cannula at baseline -Continue home medications  Type 2 diabetes CBGs over past 24 hours ranging from 151-396. Last A1c 7.0 on October 03, 2019.  Home medications include Lantus 15 units nightly, Metformin 1000 mg twice daily, NovoLog 8 units 3 times daily before  every meal.  Patient currently using 20 units of Lantus had used 17units of fast acting insulin over last 24 hours. -Hold home medications -Sensitive sliding scale insulin -Continue 20 units Lantus per  day  GERD Home medications includes omeprazole. -Pantoprazole on formulary  HLD Last lipid profile was in 2016: Total cholesterol 200, HDL 31, LDL 136, triglycerides 163.  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 -Obtain lipid panel  FEN/GI: Carb modified diet PPx: Lovenox  Disposition: Pending continued work-up for bacteremia, PICC placement, TEE  Subjective:  Patient states that she feels she is at her baseline. Currently breathing well on 3 L of oxygen, her home level. No complaints this morning.  Objective: Temp:  [97.9 F (36.6 C)-98.3 F (36.8 C)] 98.3 F (36.8 C) (01/09 0459) Pulse Rate:  [94-104] 94 (01/09 0459) Resp:  [16-21] 16 (01/09 0459) BP: (95-108)/(66-76) 96/69 (01/09 0459) SpO2:  [90 %-98 %] 98 % (01/09 0459)  Physical Exam: General: Alert and oriented in no apparent distress Heart: Regular rate and rhythm with no murmurs appreciated Lungs: CTA bilaterally, no wheezing, breathing well on her home 3 L of O2. Abdomen: Bowel sounds present, no abdominal pain  Laboratory: Recent Labs  Lab 10/27/19 0347 10/28/19 0500 10/29/19 0422  WBC 8.2 6.8 5.7  HGB 8.4* 8.4* 9.7*  HCT 30.0* 30.9* 35.1*  PLT 352 388 PENDING   Recent Labs  Lab 10/27/19 0347 10/28/19 0500 10/29/19 0422  NA 137 134* 137  K 3.4* 4.1 3.9  CL 99 99 95*  CO2 26 25 29   BUN 21* 20 21*  CREATININE 1.14* 0.90 1.00  CALCIUM 8.0* 8.0* 8.3*  PROT 6.9 6.7 7.4  BILITOT 0.5 0.5 0.6  ALKPHOS 56 60 66  ALT 9 8 9   AST 16 15 21   GLUCOSE 288* 206* 151*      Imaging/Diagnostic Tests:   Lurline Del, DO 10/29/2019, 6:36 AM PGY-1, [place] Intern pager: [phone], text pages welcome

## 2019-10-29 NOTE — Progress Notes (Signed)
Peripherally Inserted Central Catheter/Midline Placement  The IV Nurse has discussed with the patient and/or persons authorized to consent for the patient, the purpose of this procedure and the potential benefits and risks involved with this procedure.  The benefits include less needle sticks, lab draws from the catheter, and the patient may be discharged home with the catheter. Risks include, but not limited to, infection, bleeding, blood clot (thrombus formation), and puncture of an artery; nerve damage and irregular heartbeat and possibility to perform a PICC exchange if needed/ordered by physician.  Alternatives to this procedure were also discussed.  Bard Power PICC patient education guide, fact sheet on infection prevention and patient information card has been provided to patient /or left at bedside.    PICC/Midline Placement Documentation  PICC Single Lumen 10/29/19 PICC Right Basilic 39 cm 0 cm (Active)  Indication for Insertion or Continuance of Line Prolonged intravenous therapies 10/29/19 1851  Exposed Catheter (cm) 0 cm 10/29/19 1851  Site Assessment Clean;Dry;Intact 10/29/19 1851  Line Status Flushed;Saline locked;Blood return noted 10/29/19 1851  Dressing Type Transparent 10/29/19 1851  Dressing Status Clean;Dry;Intact;Antimicrobial disc in place 10/29/19 1851  Line Care Connections checked and tightened 10/29/19 1851  Line Adjustment (NICU/IV Team Only) No 10/29/19 1851  Dressing Intervention New dressing 10/29/19 1851  Dressing Change Due 11/05/19 10/29/19 1851       Elliot Dally 10/29/2019, 7:07 PM

## 2019-10-30 LAB — BASIC METABOLIC PANEL
Anion gap: 12 (ref 5–15)
BUN: 15 mg/dL (ref 6–20)
CO2: 27 mmol/L (ref 22–32)
Calcium: 8.4 mg/dL — ABNORMAL LOW (ref 8.9–10.3)
Chloride: 95 mmol/L — ABNORMAL LOW (ref 98–111)
Creatinine, Ser: 0.85 mg/dL (ref 0.44–1.00)
GFR calc Af Amer: >60 mL/min (ref >60)
GFR calc non Af Amer: >60 mL/min (ref >60)
Glucose, Bld: 137 mg/dL — ABNORMAL HIGH (ref 70–99)
Potassium: 3.2 mmol/L — ABNORMAL LOW (ref 3.5–5.1)
Sodium: 134 mmol/L — ABNORMAL LOW (ref 135–145)

## 2019-10-30 LAB — CBC
HCT: 34.5 % — ABNORMAL LOW (ref 36.0–46.0)
Hemoglobin: 9.6 g/dL — ABNORMAL LOW (ref 12.0–15.0)
MCH: 20.3 pg — ABNORMAL LOW (ref 26.0–34.0)
MCHC: 27.8 g/dL — ABNORMAL LOW (ref 30.0–36.0)
MCV: 72.9 fL — ABNORMAL LOW (ref 80.0–100.0)
Platelets: 385 10*3/uL (ref 150–400)
RBC: 4.73 MIL/uL (ref 3.87–5.11)
RDW: 18.3 % — ABNORMAL HIGH (ref 11.5–15.5)
WBC: 8.4 10*3/uL (ref 4.0–10.5)
nRBC: 0 % (ref 0.0–0.2)

## 2019-10-30 LAB — GLUCOSE, CAPILLARY
Glucose-Capillary: 140 mg/dL — ABNORMAL HIGH (ref 70–99)
Glucose-Capillary: 103 mg/dL — ABNORMAL HIGH (ref 70–99)
Glucose-Capillary: 155 mg/dL — ABNORMAL HIGH (ref 70–99)
Glucose-Capillary: 155 mg/dL — ABNORMAL HIGH (ref 70–99)
Glucose-Capillary: 235 mg/dL — ABNORMAL HIGH (ref 70–99)

## 2019-10-30 MED ORDER — POTASSIUM CHLORIDE CRYS ER 20 MEQ PO TBCR
40.0000 meq | EXTENDED_RELEASE_TABLET | Freq: Two times a day (BID) | ORAL | Status: AC
  Administered 2019-10-30: 40 meq via ORAL
  Filled 2019-10-30: qty 2

## 2019-10-30 NOTE — Progress Notes (Signed)
Pharmacy Antibiotic Note  Makayla Vasquez is a 38 y.o. female admitted on 10/23/2019 with COVID-19 infection found to have staph aureus bacteremia. Pharmacy has been consulted for Ancef dosing.   Patient is afebrile, no leukocytosis present, renal function improved and stable. TEE is pending to determine if pacer needs to be removed or if therapy needs to be changed.   Plan: - Continue Ancef 2g IV q8hr - Continue rifampin 600 mg PO daily - Monitor renal function, f/u TEE and ID recommendations  Height: 5\' 5"  (165.1 cm) Weight: 139 lb 12.4 oz (63.4 kg) IBW/kg (Calculated) : 57  Temp (24hrs), Avg:97.9 F (36.6 C), Min:97.5 F (36.4 C), Max:98.1 F (36.7 C)  Recent Labs  Lab 10/23/19 2300 10/24/19 0452 10/26/19 0634 10/27/19 0347 10/28/19 0500 10/29/19 0422 10/30/19 0401  WBC 7.4 7.3 7.0 8.2 6.8 5.7 8.4  CREATININE 1.04* 0.90 0.86 1.14* 0.90 1.00 0.85  LATICACIDVEN 1.7 1.1  --   --   --   --   --     Estimated Creatinine Clearance: 80 mL/min (by C-G formula based on SCr of 0.85 mg/dL).    Allergies  Allergen Reactions  . 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 Sertraline Hcl Hives  . Tape Other (See Comments)    "Burns" the skin    Antimicrobials this admission: Remdesivir 1/4 >>1/8 Vancomycin 1/6 >> 1/7 Cefazolin 1/6 >> Rifampin 1/6 >>   Microbiology results: 1/4 BCx: MSSA 1/6 BCx: NG x4 days  Thank you for allowing pharmacy to be a part of this patient's care.  3/7, PharmD PGY1 Pharmacy Resident

## 2019-10-30 NOTE — Evaluation (Signed)
Occupational Therapy Evaluation Patient Details Name: Makayla Vasquez MRN: [id number] DOB: 02-01-1981 Today's Date: 10/30/2019    History of Present Illness Makayla Vasquez is a 39 y.o. female presenting with staph aureus bactermia superimposed on COVID-19. PMH is significant for lupus, antisynthetase syndrome (anti-Jo-1 antibody, interstitial lung disease, myositis and elevated CK), acute on chronic respiratory failure/hypoxemia, pacemaker for congenital AV block, pulmonary hypertension, chronic dilated cardiomyopathy, T2DM, GERD, HLD.   Clinical Impression   Pt admitted with above. She demonstrates the below listed deficits and will benefit from continued OT to maximize safety and independence with BADLs.  Pt presents to OT with generalized weakness, decreased activity tolerance.  She currently requires supervision for ADLs, but requires frequent rest breaks due to fatigue.  HR to 146 with activity.  02 sats 96% on 3L supplemental 02.   PTA, pt lives with her 32 y.o. son and was independent with ADLs and required intermittent assist from her mother for IADLs such as grocery shopping.  Pt reports she utilizes a w/c when she gets fatigued at home, but ambulates the majority of the time.  She uses 2-4L 02 at home       Follow Up Recommendations  No OT follow up;Supervision - Intermittent    Equipment Recommendations  None recommended by OT    Recommendations for Other Services       Precautions / Restrictions        Mobility Bed Mobility Overal bed mobility: Independent             General bed mobility comments: with HOB elevated   Transfers Overall transfer level: Needs assistance Equipment used: None Transfers: Sit to/from Stand;Stand Pivot Transfers Sit to Stand: Supervision Stand pivot transfers: Supervision       General transfer comment: requires supervision for safety and lines     Balance Overall balance assessment: Mild deficits observed, not formally tested                                          ADL either performed or assessed with clinical judgement   ADL Overall ADL's : Needs assistance/impaired Eating/Feeding: Independent   Grooming: Wash/dry hands;Wash/dry face;Supervision/safety;Standing   Upper Body Bathing: Set up;Sitting   Lower Body Bathing: Supervison/ safety;Sit to/from stand   Upper Body Dressing : Set up;Sitting   Lower Body Dressing: Supervision/safety;Sit to/from stand   Toilet Transfer: Supervision/safety;Ambulation;Comfort height toilet   Toileting- Clothing Manipulation and Hygiene: Supervision/safety;Sit to/from stand       Functional mobility during ADLs: Supervision/safety General ADL Comments: HR to 146 after ambulating to BR      Vision         Perception     Praxis      Pertinent Vitals/Pain Pain Assessment: Faces Faces Pain Scale: Hurts little more Pain Location: abdomen  Pain Descriptors / Indicators: Grimacing Pain Intervention(s): Monitored during session;Limited activity within patient's tolerance     Hand Dominance Right   Extremity/Trunk Assessment Upper Extremity Assessment Upper Extremity Assessment: Generalized weakness   Lower Extremity Assessment Lower Extremity Assessment: Generalized weakness   Cervical / Trunk Assessment Cervical / Trunk Assessment: Other exceptions Cervical / Trunk Exceptions: pt keeps head/neck flexed    Communication Communication Communication: No difficulties   Cognition Arousal/Alertness: Awake/alert Behavior During Therapy: Flat affect Overall Cognitive Status: Within Functional Limits for tasks assessed  General Comments: Grossly WFL    General Comments  Resting HR 117  HR increased to 146 after pt ambulated to BR.  02 sats 96% on 3L.  Pt endorses fatigue.       Exercises     Shoulder Instructions      Home Living Family/patient expects to be discharged to:: Private  residence Living Arrangements: Children Available Help at Discharge: Family;Available PRN/intermittently Type of Home: Apartment Home Access: Stairs to enter Entrance Stairs-Number of Steps: 2 Entrance Stairs-Rails: Left Home Layout: One level     Bathroom Shower/Tub: Chief Strategy Officer: Standard Bathroom Accessibility: Yes   Home Equipment: Environmental consultant - 2 wheels;Shower seat;Wheelchair - manual   Additional Comments: Pt lives with 6 y.o. son      Prior Functioning/Environment Level of Independence: Independent with assistive device(s)        Comments: Pt reports she completed ADLs and IADLs mod I.  She reports when she fatigues she will sit in her w/c and self propel around her apt.  She drives, sometimes grocery shops using a scooter, and mother assists at times.   Pt uses 2-4L supplemental 02 at home         OT Problem List: Decreased strength;Decreased activity tolerance;Impaired balance (sitting and/or standing);Cardiopulmonary status limiting activity      OT Treatment/Interventions: Self-care/ADL training;Therapeutic exercise;Energy conservation;DME and/or AE instruction;Therapeutic activities;Patient/family education;Balance training    OT Goals(Current goals can be found in the care plan section) Acute Rehab OT Goals Patient Stated Goal: to feel better OT Goal Formulation: With patient Time For Goal Achievement: 11/13/19 Potential to Achieve Goals: Good ADL Goals Pt Will Perform Grooming: with modified independence;standing Pt Will Perform Upper Body Bathing: with modified independence;sitting Pt Will Perform Lower Body Bathing: with modified independence;sit to/from stand Pt Will Perform Upper Body Dressing: with modified independence;sitting Pt Will Perform Lower Body Dressing: with modified independence;sit to/from stand Pt Will Transfer to Toilet: with modified independence;regular height toilet;grab bars Pt Will Perform Toileting - Clothing  Manipulation and hygiene: with modified independence;sit to/from stand Pt/caregiver will Perform Home Exercise Program: Increased strength;Right Upper extremity;Left upper extremity;With theraband;With written HEP provided;Independently Additional ADL Goal #1: Pt will independently incorporate energy conservation techniques during ADLs  OT Frequency: Min 2X/week   Barriers to D/C:            Co-evaluation PT/OT/SLP Co-Evaluation/Treatment: Yes Reason for Co-Treatment: To address functional/ADL transfers   OT goals addressed during session: ADL's and self-care      AM-PAC OT "6 Clicks" Daily Activity     Outcome Measure Help from another person eating meals?: None Help from another person taking care of personal grooming?: A Little Help from another person toileting, which includes using toliet, bedpan, or urinal?: A Little Help from another person bathing (including washing, rinsing, drying)?: A Little Help from another person to put on and taking off regular upper body clothing?: A Little Help from another person to put on and taking off regular lower body clothing?: A Little 6 Click Score: 19   End of Session Equipment Utilized During Treatment: Oxygen Nurse Communication: Mobility status  Activity Tolerance: Patient limited by fatigue Patient left: in chair;with call bell/phone within reach  OT Visit Diagnosis: Unsteadiness on feet (R26.81)                Time: 8338-2505 OT Time Calculation (min): 20 min Charges:  OT General Charges $OT Visit: 1 Visit OT Evaluation $OT Eval Moderate Complexity: 1 Mod  Casi Westerfeld  C., OTR/L Acute Rehabilitation Services Pager [phone] Office [phone]   Lucille Passy M 10/30/2019, 4:35 PM

## 2019-10-30 NOTE — Evaluation (Signed)
Physical Therapy Evaluation Patient Details Name: NAVEA WOODROW MRN: [id number] DOB: 11/23/1980 Today's Date: 10/30/2019   History of Present Illness  IVYONNA HOELZEL is a 39 y.o. female presenting with staph aureus bactermia superimposed on COVID-19. PMH is significant for lupus, antisynthetase syndrome (anti-Jo-1 antibody, interstitial lung disease, myositis and elevated CK), acute on chronic respiratory failure/hypoxemia, pacemaker for congenital AV block, pulmonary hypertension, chronic dilated cardiomyopathy, T2DM, GERD, HLD.  Clinical Impression   Pt admitted with above diagnosis. Patient has complex medical history and is now hospitalized 6 days with additional generalized weakness. She requires further testing and possible surgery to remove her pacemaker. She normally lives with her 79 yo son and is motivated to get better and go home to take care of him.  Pt currently with functional limitations due to the deficits listed below (see PT Problem List). Pt will benefit from skilled PT to increase their independence and safety with mobility to allow discharge to the venue listed below.       Follow Up Recommendations No PT follow up;Supervision/Assistance - 24 hour    Equipment Recommendations  None recommended by PT    Recommendations for Other Services       Precautions / Restrictions Restrictions Weight Bearing Restrictions: No      Mobility  Bed Mobility Overal bed mobility: Independent             General bed mobility comments: with HOB elevated   Transfers Overall transfer level: Needs assistance Equipment used: None Transfers: Sit to/from Stand;Stand Pivot Transfers Sit to Stand: Supervision Stand pivot transfers: Supervision       General transfer comment: requires supervision for safety and lines   Ambulation/Gait Ambulation/Gait assistance: Supervision Gait Distance (Feet): 12 Feet(seated rest/toileting; 5, standing rest; 15) Assistive device:  None Gait Pattern/deviations: Step-through pattern;Decreased stride length;Drifts right/left     General Gait Details: one episode of drifting towards her right with pt recovering  Stairs            Wheelchair Mobility    Modified Rankin (Stroke Patients Only)       Balance Overall balance assessment: Mild deficits observed, not formally tested                                           Pertinent Vitals/Pain Pain Assessment: Faces Faces Pain Scale: Hurts little more Pain Location: abdomen  Pain Descriptors / Indicators: Grimacing Pain Intervention(s): Limited activity within patient's tolerance;Monitored during session    Home Living Family/patient expects to be discharged to:: Private residence Living Arrangements: Children Available Help at Discharge: Family;Available PRN/intermittently Type of Home: Apartment Home Access: Stairs to enter Entrance Stairs-Rails: Left Entrance Stairs-Number of Steps: 2 Home Layout: One level Home Equipment: Walker - 2 wheels;Shower seat;Wheelchair - manual Additional Comments: Pt lives with 14 y.o. son    Prior Function Level of Independence: Independent with assistive device(s)         Comments: Pt reports she completed ADLs and IADLs mod I.  She reports when she fatigues she will sit in her w/c and self propel around her apt.  She drives, sometimes grocery shops using a scooter, and mother assists at times.   Pt uses 2-4L supplemental 02 at home      Hand Dominance   Dominant Hand: Right    Extremity/Trunk Assessment   Upper Extremity Assessment Upper Extremity Assessment: Defer to  OT evaluation    Lower Extremity Assessment Lower Extremity Assessment: Generalized weakness    Cervical / Trunk Assessment Cervical / Trunk Assessment: Other exceptions Cervical / Trunk Exceptions: pt keeps head/neck flexed   Communication   Communication: No difficulties  Cognition Arousal/Alertness:  Awake/alert Behavior During Therapy: Flat affect Overall Cognitive Status: Within Functional Limits for tasks assessed                                 General Comments: Grossly WFL       General Comments General comments (skin integrity, edema, etc.): Resting HR 117  HR increased to 146 after pt ambulated to BR.  02 sats 96% on 3L.  Pt endorses fatigue.       Exercises Other Exercises Other Exercises: educated pt in importance of continuing to move arms and legs for AROM to prevent stiffness, pain, and weakness   Assessment/Plan    PT Assessment Patient needs continued PT services  PT Problem List Decreased strength;Decreased activity tolerance;Decreased balance;Decreased mobility;Decreased knowledge of use of DME;Cardiopulmonary status limiting activity;Pain       PT Treatment Interventions DME instruction;Gait training;Functional mobility training;Therapeutic activities;Therapeutic exercise;Balance training;Patient/family education    PT Goals (Current goals can be found in the Care Plan section)  Acute Rehab PT Goals Patient Stated Goal: to feel better PT Goal Formulation: With patient Time For Goal Achievement: 11/13/19 Potential to Achieve Goals: Fair    Frequency Min 2X/week   Barriers to discharge        Co-evaluation PT/OT/SLP Co-Evaluation/Treatment: Yes Reason for Co-Treatment: Necessary to address cognition/behavior during functional activity;To address functional/ADL transfers PT goals addressed during session: Mobility/safety with mobility;Balance OT goals addressed during session: ADL's and self-care       AM-PAC PT "6 Clicks" Mobility  Outcome Measure Help needed turning from your back to your side while in a flat bed without using bedrails?: None Help needed moving from lying on your back to sitting on the side of a flat bed without using bedrails?: None Help needed moving to and from a bed to a chair (including a wheelchair)?: A  Little Help needed standing up from a chair using your arms (e.g., wheelchair or bedside chair)?: A Little Help needed to walk in hospital room?: A Little Help needed climbing 3-5 steps with a railing? : A Lot 6 Click Score: 19    End of Session Equipment Utilized During Treatment: Oxygen Activity Tolerance: Patient limited by fatigue Patient left: in chair;with call bell/phone within reach Nurse Communication: Mobility status PT Visit Diagnosis: Unsteadiness on feet (R26.81);Muscle weakness (generalized) (M62.81)    Time: 2694-8546 PT Time Calculation (min) (ACUTE ONLY): 23 min   Charges:   PT Evaluation $PT Eval Moderate Complexity: 1 Mod           Arby Barrette, PT Pager [phone]   Rexanne Mano 10/30/2019, 6:01 PM

## 2019-10-30 NOTE — Progress Notes (Signed)
Family Medicine Teaching Service Daily Progress Note Intern Pager: [phone]  Patient name: Makayla Vasquez Medical record number: [id number] Date of birth: 06-24-1981 Age: 39 y.o. Gender: female  Primary Care Provider: Arlyce Harman, DO Consultants: None Code Status: Full  Pt Overview and Major Events to Date:  1/4-admitted  Assessment and Plan:  COVID-19  acute on chronic respiratory failure  hypoxemia Satting well on 3 L.  Home requirement of 2-4 L.  Completed 5-day course of remdesivir.  On day 7 of 10 of 6 mg of Decadron. -Continue airborne and contact precautions -Continuous cardiac monitoring pulse ox -Decadron 6 mg p.o. daily for 10 days, day 6 of 10 -Maintain O2 sat greater than 90% via oxygen therapy -Heart healthy/carb modified diet  MSSA bacteremia with pacer Patient with 1/4 blood culture positive for pansensitive staph aureus.  TTE showed 35-40% ejection fraction without vegetation.  Repeat blood culture shows no growth at 3 days.  PICC placed on 01/9.  Infectious disease spoke with cardiology who recommended patient stay inpatient on antibiotics until she gets the TEE.  It is scheduled for 01/12.  Pending TEE results, will either narrow antibiotics pending TEE and/or need to remove pacer. -Infectious disease consulted, appreciate recommendations -Cardiology consulted, appreciate recommendations -Continue IV cefazolin and p.o. rifampin rifampin   Nausea, improved Patient with intermittant nasuea. No vomitting. It is improved.  - Zofran PRN  HFrEF, stable -Continue bisoprolol 5 mg daily - torsemide 20 mg -Strict I's and O's -Daily weights -Daily BMP, continue to monitor patient's creatinine  Hypokalemia K 3.2. -Replete with K-Dur -Trend BMP  Congenital AV block s/p pacemaker Congenital heart block underwent first PPM at age 68 (mother had SLE). 2011 had echo with EF 35 to 40%.  The dysfunction was thought to be due to RV pacing so upgraded to CRT-P.   Patient follows with Dr. Ladona Ridgel in EP.  Last battery change was in 2004, according to notes patient is due for a battery change.  Lupus  Anti-Synthetase Syndrome (anti-Jo-1 antibody, interstitial lung disease, myositis, and elevated CK) Patient is followed by Maxie Barb at [facility] rheumatology.  CK is chronically elevated and is sensitive to steroids.  Home medications include CellCept, tacrolimus, prednisone 10 mg.   -Continue holding CellCept and tacrolimus at this time per recommendations of her rheumatologist -Hold prednisone for Decadron treatment  Interstitial lung disease Followed by Dr. Jetty Duhamel with [facility] pulmonology.  Former smoker.  Home medications include albuterol 2 puffs every 4 hours as needed, cromolyn, prednisone 10 mg daily, Breo Ellipta.  Patient is also on 2-4 L of oxygen via nasal cannula at baseline -Continue home medications  Type 2 diabetes CBGs over past 24 hours ranging from 80-2 80. Last A1c 7.0 on October 03, 2019.  Home medications include Lantus 15 units nightly, Metformin 1000 mg twice daily, NovoLog 8 units 3 times daily before every meal.  Patient currently using 20 units of Lantus had used 17units of fast acting insulin over last 24 hours. -Hold home medications -Sensitive sliding scale insulin -Lantus 15 units Lantus per day   GERD Home medications includes omeprazole. -Pantoprazole on formulary   HLD Last lipid profile was in 2016: Total cholesterol 200, HDL 31, LDL 136, triglycerides 163.  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 -Obtain lipid panel in outpatient setting  FEN/GI: Carb modified diet PPx: Lovenox  Disposition: Pending continued work-up for bacteremia  Subjective:  Denies worsening  SOB. Nausea is better. Denies Vomitting or abdominal pain. Eager to get TEE and to go home.  Objective: Temp:  [97.3 F (36.3 C)-98.1 F (36.7 C)] 97.5 F (36.4 C)  (01/10 0630) Pulse Rate:  [80-113] 91 (01/10 0630) Resp:  [13-32] 23 (01/10 0630) BP: (78-115)/(61-89) 115/73 (01/10 0630) SpO2:  [90 %-100 %] 99 % (01/10 0726) Weight:  [63.4 kg-63.5 kg] 63.4 kg (01/10 0636)  Physical Exam: General: Alert and oriented in no apparent distress Heart: Regular rate and rhythm with no murmurs appreciated Lungs: CTA bilaterally, no wheezing, breathing well on her home 3 L of O2. Abdomen: Bowel sounds present, no abdominal pain  Laboratory: Recent Labs  Lab 10/28/19 0500 10/29/19 0422 10/30/19 0401  WBC 6.8 5.7 8.4  HGB 8.4* 9.7* 9.6*  HCT 30.9* 35.1* 34.5*  PLT 388 PLATELET CLUMPS NOTED ON SMEAR, UNABLE TO ESTIMATE 385   Recent Labs  Lab 10/27/19 0347 10/28/19 0500 10/29/19 0422 10/30/19 0401  NA 137 134* 137 134*  K 3.4* 4.1 3.9 3.2*  CL 99 99 95* 95*  CO2 26 25 29 27   BUN 21* 20 21* 15  CREATININE 1.14* 0.90 1.00 0.85  CALCIUM 8.0* 8.0* 8.3* 8.4*  PROT 6.9 6.7 7.4  --   BILITOT 0.5 0.5 0.6  --   ALKPHOS 56 60 66  --   ALT 9 8 9   --   AST 16 15 21   --   GLUCOSE 288* 206* 151* 137*      Imaging/Diagnostic Tests:   Bonnita Hollow, MD 10/30/2019, 8:48 AM PGY-3, [place] Intern pager: [phone], text pages welcome

## 2019-10-30 NOTE — Progress Notes (Signed)
   Vital Signs MEWS/VS Documentation      10/30/2019 0726 10/30/2019 0800 10/30/2019 0925 10/30/2019 1001   MEWS Score:  --  2  1  4    MEWS Score Color:  --  Yellow  Green  Red   BP:  --  92/66  105/74  --   O2 Device:  Nasal Cannula  --  --  --   O2 Flow Rate (L/min):  3 L/min  --  --  --     - Not an acute change. Patient's HR increases & SPO2 decreases on the high 80's whenever she gets oob to use the bsc. - No intervention as of this time. -will continue to monitor patient.      Makayla Vasquez Makayla Vasquez 10/30/2019,10:29 AM

## 2019-10-31 ENCOUNTER — Inpatient Hospital Stay (HOSPITAL_COMMUNITY): Payer: Medicare HMO | Admitting: Certified Registered"

## 2019-10-31 ENCOUNTER — Encounter (HOSPITAL_COMMUNITY)
Admission: EM | Disposition: A | Discharge status: Home / Self Care | Payer: Self-pay | Source: Home / Self Care | Attending: Family Medicine

## 2019-10-31 ENCOUNTER — Inpatient Hospital Stay (HOSPITAL_COMMUNITY): Payer: Medicare HMO

## 2019-10-31 DIAGNOSIS — I361 Nonrheumatic tricuspid (valve) insufficiency: Secondary | ICD-10-CM

## 2019-10-31 DIAGNOSIS — I340 Nonrheumatic mitral (valve) insufficiency: Secondary | ICD-10-CM

## 2019-10-31 DIAGNOSIS — I479 Paroxysmal tachycardia, unspecified: Secondary | ICD-10-CM | POA: Insufficient documentation

## 2019-10-31 DIAGNOSIS — R7881 Bacteremia: Secondary | ICD-10-CM

## 2019-10-31 DIAGNOSIS — I5023 Acute on chronic systolic (congestive) heart failure: Secondary | ICD-10-CM

## 2019-10-31 HISTORY — PX: TEE WITHOUT CARDIOVERSION: SHX5443

## 2019-10-31 LAB — GLUCOSE, CAPILLARY
Glucose-Capillary: 102 mg/dL — ABNORMAL HIGH (ref 70–99)
Glucose-Capillary: 110 mg/dL — ABNORMAL HIGH (ref 70–99)
Glucose-Capillary: 140 mg/dL — ABNORMAL HIGH (ref 70–99)
Glucose-Capillary: 163 mg/dL — ABNORMAL HIGH (ref 70–99)
Glucose-Capillary: 195 mg/dL — ABNORMAL HIGH (ref 70–99)
Glucose-Capillary: 199 mg/dL — ABNORMAL HIGH (ref 70–99)

## 2019-10-31 LAB — BASIC METABOLIC PANEL
Anion gap: 10 (ref 5–15)
BUN: 20 mg/dL (ref 6–20)
CO2: 31 mmol/L (ref 22–32)
Calcium: 8.6 mg/dL — ABNORMAL LOW (ref 8.9–10.3)
Chloride: 97 mmol/L — ABNORMAL LOW (ref 98–111)
Creatinine, Ser: 0.94 mg/dL (ref 0.44–1.00)
GFR calc Af Amer: >60 mL/min (ref >60)
GFR calc non Af Amer: >60 mL/min (ref >60)
Glucose, Bld: 138 mg/dL — ABNORMAL HIGH (ref 70–99)
Potassium: 3.5 mmol/L (ref 3.5–5.1)
Sodium: 138 mmol/L (ref 135–145)

## 2019-10-31 LAB — CBC
HCT: 32.4 % — ABNORMAL LOW (ref 36.0–46.0)
Hemoglobin: 8.6 g/dL — ABNORMAL LOW (ref 12.0–15.0)
MCH: 20.2 pg — ABNORMAL LOW (ref 26.0–34.0)
MCHC: 26.5 g/dL — ABNORMAL LOW (ref 30.0–36.0)
MCV: 76.1 fL — ABNORMAL LOW (ref 80.0–100.0)
Platelets: 380 10*3/uL (ref 150–400)
RBC: 4.26 MIL/uL (ref 3.87–5.11)
RDW: 18.6 % — ABNORMAL HIGH (ref 11.5–15.5)
WBC: 6.5 10*3/uL (ref 4.0–10.5)
nRBC: 0 % (ref 0.0–0.2)

## 2019-10-31 LAB — CULTURE, BLOOD (ROUTINE X 2)
Culture: NO GROWTH
Culture: NO GROWTH
Special Requests: ADEQUATE

## 2019-10-31 SURGERY — ECHOCARDIOGRAM, TRANSESOPHAGEAL
Anesthesia: Monitor Anesthesia Care

## 2019-10-31 MED ORDER — PROPOFOL 500 MG/50ML IV EMUL
INTRAVENOUS | Status: DC | PRN
  Administered 2019-10-31: 120 ug/kg/min via INTRAVENOUS

## 2019-10-31 MED ORDER — SODIUM CHLORIDE 0.9 % IV SOLN: INTRAVENOUS | Status: DC

## 2019-10-31 MED ORDER — PROPOFOL 10 MG/ML IV BOLUS
INTRAVENOUS | Status: DC | PRN
  Administered 2019-10-31 (×2): 30 mg via INTRAVENOUS

## 2019-10-31 MED ORDER — POTASSIUM CHLORIDE CRYS ER 20 MEQ PO TBCR
40.0000 meq | EXTENDED_RELEASE_TABLET | Freq: Once | ORAL | Status: AC
  Administered 2019-10-31: 40 meq via ORAL
  Filled 2019-10-31: qty 2

## 2019-10-31 NOTE — Interval H&P Note (Signed)
History and Physical Interval Note:  10/31/2019 2:28 PM  Makayla Vasquez  has presented today for surgery, with the diagnosis of bacteremia.  The various methods of treatment have been discussed with the patient and family. After consideration of risks, benefits and other options for treatment, the patient has consented to  Procedure(s): TRANSESOPHAGEAL ECHOCARDIOGRAM (TEE) (N/A) as a surgical intervention.  The patient's history has been reviewed, patient examined, no change in status, stable for surgery.  I have reviewed the patient's chart and labs.  Questions were answered to the patient's satisfaction.     Atthew Coutant [employer]

## 2019-10-31 NOTE — Progress Notes (Signed)
PHARMACY CONSULT NOTE FOR:  OUTPATIENT  PARENTERAL ANTIBIOTIC THERAPY (OPAT)  Indication: MSSA bacteremia Regimen: cefazolin 2 grams iv Q 8  End date: 11/22/19   IV antibiotic discharge orders are pended. To discharging provider:  please sign these orders via discharge navigator,  Select New Orders & click on the button choice - Manage This Unsigned Work.     Thank you for allowing pharmacy to be a part of this patient's care.  Elwin Sleight 10/31/2019, 4:02 PM

## 2019-10-31 NOTE — Progress Notes (Addendum)
Mews score: yellow. Pt tachycardic. SBP in 90s. Change occurred while at TEE. Pt back in room. Returning to baseline. Continuing to monitor.

## 2019-10-31 NOTE — Progress Notes (Signed)
Pt off floor for TEE

## 2019-10-31 NOTE — Progress Notes (Addendum)
TEE without vegetations Discussed with Dr. Ladona Ridgel.   No device extraction at this time, with long term/antibiotic therapy as directed by ID service. Should she have recurrent bacteremia device extraction at that time would be mandatory.  [name], PA-C  EP Attending  Agree with above. The patient has no evidence of vegetation by TEE and blood cultures are 1/4 positive. No peripheral stigmata of SBE. I would agree with several weeks of IV anti-biotics. If she has recurrent bacteremia, then PM system extraction would be recommended.  Makayla Vasquez.D.

## 2019-10-31 NOTE — Progress Notes (Addendum)
Family Medicine Teaching Service Daily Progress Note Intern Pager: [phone]  Patient name: Makayla Vasquez Medical record number: [id number] Date of birth: Mar 23, 1981 Age: 39 y.o. Gender: female  Primary Care Provider: Arlyce Harman, DO Consultants: None Code Status: Full  Pt Overview and Major Events to Date:  1/4-admitted  Assessment and Plan:  COVID-19  acute on chronic respiratory failure  hypoxemia Patient states that her breathing feels fine this AM, O2 sats 99% on 3 L.  Home requirement of 2-4 L.  Completed 5-day course of remdesivir.  On day 8 of 10 of 6 mg of Decadron. -Continue airborne and contact precautions -Continuous cardiac monitoring pulse ox -Decadron 6 mg p.o. daily for 10 days, day 8 of 10 -Maintain O2 sat greater than 90% via oxygen therapy -Heart healthy/carb modified diet  MSSA bacteremia with pacer Patient with 1/4 blood culture positive for pan-sensitive staph aureus.  TTE showed 35-40% ejection fraction without vegetation.  Repeat blood culture shows no growth at 3 days.  PICC placed on 01/9.  Infectious disease spoke with cardiology who recommended patient stay inpatient on antibiotics until she gets the TEE.  It is scheduled for 01/11.  Pending TEE results, will either narrow antibiotics pending TEE and/or need to remove pacer. -TEE scheduled for today, will plan for narrowing abx/pacer removal pending results  -Infectious disease consulted, appreciate recommendations  -Cardiology consulted, appreciate recommendations -Continue IV cefazolin and p.o. rifampin rifampin   Nausea, resolved Patient with no complaints of nausea this AM.  - Zofran PRN  HFrEF, stable Wt 64.1kg from 63.4 previously. Output 1060 net positive overnight with 1.4 + liters since admission.  -Continue bisoprolol 5 mg daily -Torsemide 20 mg -Strict I's and O's -Daily weights -Daily BMP, continue to monitor patient's creatinine  Hypokalemia K 3.5<3.2 -Replete with K-Dur as  appropriate -Trend BMP  Congenital AV block s/p pacemaker Congenital heart block underwent first PPM at age 70 (mother had SLE). 2011 had echo with EF 35 to 40%.  The dysfunction was thought to be due to RV pacing so upgraded to CRT-P.  Patient follows with Dr. Ladona Ridgel in EP.  Last battery change was in 2004, according to notes patient is due for a battery change.  Lupus  Anti-Synthetase Syndrome (anti-Jo-1 antibody, interstitial lung disease, myositis, and elevated CK) Patient is followed by Maxie Barb at [facility] rheumatology.  CK is chronically elevated and is sensitive to steroids.  Home medications include CellCept, tacrolimus, prednisone 10 mg.   -Continue holding CellCept and tacrolimus at this time per recommendations of her rheumatologist -Hold prednisone for Decadron treatment to be completed 11/02/19  Interstitial lung disease Followed by Dr. Jetty Duhamel with [facility] pulmonology.  Former smoker.  Home medications include albuterol 2 puffs every 4 hours as needed, cromolyn, prednisone 10 mg daily, Breo Ellipta.  Patient is also on 2-4 L of oxygen via nasal cannula at baseline -Continue home medications  Type 2 diabetes CBGs over past 24 hours ranging from 102-235. Last A1c 7.0 on October 03, 2019.  Home medications include Lantus 15 units nightly, Metformin 1000 mg twice daily, NovoLog 8 units 3 times daily before every meal.  Patient currently using 15 units of Lantus had used 7 units of fast acting insulin over last 24 hours. -Hold home medications -Sensitive sliding scale insulin -15 units Lantus per day   GERD Home medications includes omeprazole. -Pantoprazole on formulary   HLD Last lipid profile was in 2016: Total cholesterol 200, HDL 31, LDL 136, triglycerides 163.  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 -Obtain lipid panel in outpatient setting  FEN/GI: Carb modified diet PPx:  Lovenox  Disposition: Pending continued work-up for bacteremia  Subjective:  Patient reports that her breathing is doing ok this morning and she denies CP or worsening SOB.   Objective: Temp:  [98 F (36.7 C)-98.6 F (37 C)] 98.2 F (36.8 C) (01/11 0440) Pulse Rate:  [90-119] 90 (01/11 0440) Resp:  [14-37] 19 (01/11 0440) BP: (94-105)/(63-76) 99/63 (01/11 0440) SpO2:  [90 %-99 %] 99 % (01/11 0440) Weight:  [64.1 kg] 64.1 kg (01/11 0440)  Physical Exam: General: chronically ill appearing female sitting up in bed in NAD  Heart: no friction rubs appreciated, normal s1 and s2  Lungs: decreased breath sounds, deep respirations lead to coughing fit, patient with normal respiratory effort, on 3 liters via [place]  Abdomen: soft, NT, + bowel sounds  Laboratory: Recent Labs  Lab 10/29/19 0422 10/30/19 0401 10/31/19 0344  WBC 5.7 8.4 6.5  HGB 9.7* 9.6* 8.6*  HCT 35.1* 34.5* 32.4*  PLT PLATELET CLUMPS NOTED ON SMEAR, UNABLE TO ESTIMATE 385 380   Recent Labs  Lab 10/27/19 0347 10/28/19 0500 10/29/19 0422 10/30/19 0401 10/31/19 0344  NA 137 134* 137 134* 138  K 3.4* 4.1 3.9 3.2* 3.5  CL 99 99 95* 95* 97*  CO2 26 25 29 27 31   BUN 21* 20 21* 15 20  CREATININE 1.14* 0.90 1.00 0.85 0.94  CALCIUM 8.0* 8.0* 8.3* 8.4* 8.6*  PROT 6.9 6.7 7.4  --   --   BILITOT 0.5 0.5 0.6  --   --   ALKPHOS 56 60 66  --   --   ALT 9 8 9   --   --   AST 16 15 21   --   --   GLUCOSE 288* 206* 151* 137* 138*    Imaging/Diagnostic Tests: No new imaging in last 24 hours  Stark Klein, MD 10/31/2019, 9:14 AM PGY-3, [place] Intern pager: [phone], text pages welcome

## 2019-10-31 NOTE — Progress Notes (Signed)
Echocardiogram Echocardiogram Transesophageal has been performed.  Makayla Vasquez 10/31/2019, 2:54 PM

## 2019-10-31 NOTE — CV Procedure (Signed)
Procedure: TEE  Sedation: Per anesthesiology.   Findings: Please see echo section for full report.  No vegetation on the valves or the device leads noted.   [name] 10/31/2019 2:57 PM

## 2019-10-31 NOTE — H&P (View-Only) (Signed)
Advanced Heart Failure Team Consult Note   Primary Physician: Nuala Alpha, DO PCP-Cardiologist:  No primary care provider on file.  Reason for Consultation: COVID-19 in CHF clinic patient, MSSA bacteremia with device and needs TEE  HPI:    Makayla Vasquez is seen today for evaluation of MSSA bacteremia/chronic CHF at the request of Dr. Lovena Le.   Makayla Vasquez is 39 y.o. 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   She has a h/o congenital HB and underwent first PPM at age 41. In 2011 had ECHO with EF 35-40%. LV dysfunction felt to be due to RV pacing so upgraded to CRT-P. Echo in 10/15 with EF 25%.  She also has concomitant lung disease.   She follows with Dr. Annamaria Boots for her lung disease. She is a former smoker.  Admitted in 8/15 and 9/15 with worsening respiratory status and severe hypoxia. Had extensive w/u. Thought to have a viral pneumonitis complicated by atelectasis and mild edema vs. an autoimmune process. Discharged home on a course of levaquin and prednisone.  She was offered a lung biopsy for further diagnosis versus empiric treatment with steroids. She opted for empiric steroid treatment. She is followed by a rheumatologist and was diagnosed with anti-synthetase syndrome with ILD.  She has been on Cellcept, prednisone, and tacrolimus.  She is on oxygen 2-4 L at home.  Admission in 5/17 for ?sepsis and management of interstitial lung disease.   She was admitted with PNA in 1/18.  Lasix, losartan, and bisoprolol stopped.   Echo in 1/19 showed EF 45-50%, with mild to moderately decreased RV systolic function and moderate MR.  Echo in 2/20 showed EF 35-40% with normal RV size and mildly decreased RV systolic function.   She was admitted earlier this month after developing cough and worsening dyspnea.  She was found to have COVID-19 PNA.  She has completed  Remdesivir course and is getting dexamethasone.  Blood cultures showed 1/4 positive for MSSA. Oxygen level down to 3L [place], which is around her baseline.  She is afebrile.  She reports that her breathing is at baseline, she is on torsemide 20 mg daily.  Main complaint is persistent cough.  She was seen by ID, TEE recommended given device with MSSA bacteremia.   Review of Systems: All systems reviewed and negative except as per HPI.   Home Medications Prior to Admission medications   Medication Sig Start Date End Date Taking? Authorizing Provider  albuterol (VENTOLIN HFA) 108 (90 Base) MCG/ACT inhaler Inhale 2 puffs into the lungs every 4 (four) hours as needed for wheezing or shortness of breath. 02/25/19  Yes Tammi Klippel, Sherin, DO  bisoprolol (ZEBETA) 5 MG tablet Take 1 tablet (5 mg total) by mouth daily. 08/09/19  Yes Clegg, Amy D, NP  dextromethorphan-guaiFENesin (ROBITUSSIN-DM) 10-100 MG/5ML liquid TAKE 5 MLS BY MOUTH EVERY 4 (FOUR) HOURS AS NEEDED FOR COUGH. Patient taking differently: Take 5 mLs by mouth every 4 (four) hours as needed for cough.  10/27/18  Yes [place] Bing, DO  Etonogestrel (NEXPLANON [place]) Inject 1 Device into the skin once. Every 3 years   Yes [provider]  ferrous sulfate 325 (65 FE) MG EC tablet Take 1 tablet (325 mg total) by mouth daily with breakfast. 08/11/19 02/07/20 Yes Lockamy, Timothy, DO  furosemide (LASIX) 40 MG tablet TAKE 1 TABLET BY MOUTH EVERY DAY Patient taking differently: Take 40  mg by mouth daily.  10/17/19  Yes [name], MD  LANTUS SOLOSTAR 100 UNIT/ML Solostar Pen INJECT [place] DAY Patient taking differently: Inject 15 Units into the skin daily.  01/05/19  Yes [place] Bing, DO  loratadine (CLARITIN) 10 MG tablet Take 1 tablet (10 mg total) by mouth daily. 08/15/19  Yes Young, Tarri Fuller D, MD  metFORMIN (GLUCOPHAGE) 1000 MG tablet TAKE 1 TABLET BY MOUTH TWICE DAILY WITH A MEAL Patient taking differently: Take  1,000 mg by mouth 2 (two) times daily with a meal.  03/22/19  Yes McMullen, David J, DO  NOVOLOG FLEXPEN 100 UNIT/ML FlexPen INJECT 8 UNITS INTO THE SKIN 3 TIMES DAILY WITH MEALS. Patient taking differently: Inject 8 Units into the skin 3 (three) times daily with meals.  11/02/18  Yes [place] Bing, DO  omeprazole (PRILOSEC) 20 MG capsule TAKE 1 CAPSULE BY MOUTH EVERY DAY Patient taking differently: Take 20 mg by mouth daily.  05/02/19  Yes Young, Tarri Fuller D, MD  OXYGEN Inhale 2-4 L into the lungs continuous.    Yes [provider]  potassium chloride SA (K-DUR,KLOR-CON) 20 MEQ tablet Take 1 tablet (20 mEq total) by mouth daily. 02/01/19  Yes Bensimhon, Shaune Pascal, MD  predniSONE (DELTASONE) 10 MG tablet Take 1 tablet (10 mg total) by mouth daily with breakfast. 09/08/19  Yes Young, Clinton D, MD  tacrolimus (PROGRAF) 0.5 MG capsule Take 1.5 mg by mouth 2 (two) times daily. 11/27/17  Yes [provider]  traZODone (DESYREL) 50 MG tablet TAKE 1 TABLET (50 MG TOTAL) BY MOUTH AT BEDTIME AS NEEDED FOR SLEEP. 09/27/18  Yes [place] Bing, DO  Alcohol Swabs (B-D SINGLE USE SWABS REGULAR) PADS 1 application by Does not apply route 4 (four) times daily. 03/15/19   [place] Bing, DO  benzonatate (TESSALON PERLES) 100 MG capsule Take 1 capsule (100 mg total) by mouth 3 (three) times daily as needed for cough. Patient not taking: Reported on 10/24/2019 10/17/19   Stark Klein, MD  Blood Glucose Calibration (ACCU-CHEK AVIVA) SOLN 1 application by In Vitro route 4 (four) times daily. 03/18/19   [place] Bing, DO  blood glucose meter kit and supplies Dispense based on patient and insurance preference. Use up to four times daily as directed. (FOR ICD-10 E10.9, E11.9). 03/18/19   [place] Bing, DO  cromolyn (NASALCROM) 5.2 MG/ACT nasal spray Place 1 spray into both nostrils 4 (four) times daily. Patient not taking: Reported on 10/24/2019 08/15/19   Baird Lyons D, MD  fluticasone  furoate-vilanterol (BREO ELLIPTA) 100-25 MCG/INH AEPB Inhale 1 puff into the lungs daily. Patient not taking: Reported on 10/24/2019 06/07/19   Baird Lyons D, MD  Fluticasone-Umeclidin-Vilant (TRELEGY ELLIPTA) 100-62.5-25 MCG/INH AEPB Inhale 1 puff into the lungs daily. Patient not taking: Reported on 08/15/2019 07/19/19   Baird Lyons D, MD  mycophenolate (CELLCEPT) 500 MG tablet Take 1,000 mg by mouth 2 (two) times daily.     [provider]    Past Medical History: Past Medical History:  Diagnosis Date  . Anxiety   . Arthritis    rheumatoid arthritis- mild, no rheumatology care   . Asthma    seasonal allergies   . Asymptomatic LV dysfunction    a. Echo in Dec 2011 with EF 35 to 40%. Felt to be due to paced rhythm. b. EF 25-30% in 07/2014.  . Bipolar affective disorder ([place])   . Cardiac pacemaker    a. Since age 26  in 1997. b. Upgrade to BiV in 2013.  [name] Carpal tunnel syndrome of right wrist   . CHF (congestive heart failure) ([place])   . Congenital complete AV block   . Cor pulmonale, chronic ([place]) 04/08/2018  . Depression    bipolar  . Diabetes mellitus without complication ([place])   . GERD (gastroesophageal reflux disease)   . Hypertension   . Interstitial lung disease ([place])   . Lupus ([place])   . Lupus (systemic lupus erythematosus) ([place])   . Obesity   . Pneumonitis    a. a/w hypoxia - inflammatory - large workup 07/2014.  . Presence of permanent cardiac pacemaker   . Seizures ([place])    as a child- from high fever  . Sinus tachycardia     Past Surgical History: Past Surgical History:  Procedure Laterality Date  . ATRIAL TACH ABLATION N/A 08/14/2014   Procedure: ATRIAL TACH ABLATION;  Surgeon: [name], MD;  Location: [facility] CATH LAB;  Service: Cardiovascular;  Laterality: N/A;  . BI-VENTRICULAR PACEMAKER UPGRADE N/A 03/08/2012   Procedure: BI-VENTRICULAR PACEMAKER UPGRADE;  Surgeon: [name], MD;  Location: [facility] CATH LAB;  Service: Cardiovascular;  Laterality:  N/A;  . CARPAL TUNNEL WITH CUBITAL TUNNEL Right 07/26/2013   Procedure: RIGHT LIMITED OPEN CARPAL TUNNEL RELEASE ,  RIGHT CUBITAL TUNNEL RELEASE, INSITU VERSES ULNAR NERVE DECOMPRESSION AND ANTERIOR TRANSPOSITION;  Surgeon: [name], MD;  Location: [place];  Service: Orthopedics;  Laterality: Right;  . CESAREAN SECTION    . CHOLECYSTECTOMY    . CYST EXCISION  12/10/2012   THYROID  . INSERT / REPLACE / REMOVE PACEMAKER     2001  . IUD REMOVAL  11/03/2011   Procedure: INTRAUTERINE DEVICE (IUD) REMOVAL;  Surgeon: [name], MD;  Location: [facility];  Service: Gynecology;  Laterality: N/A;  . NASAL FRACTURE SURGERY     /w plate   . RIGHT HEART CATHETERIZATION N/A 10/26/2014   Procedure: RIGHT HEART CATH;  Surgeon: [name], MD;  Location: [facility] CATH LAB;  Service: Cardiovascular;  Laterality: N/A;  . THROAT SURGERY  1994   s/p laser treatment  . THYROGLOSSAL DUCT CYST N/A 12/10/2012   Procedure: REVISION OF THYROGLOSSAL DUCT CYST EXCISION;  Surgeon: [name], MD;  Location: [place];  Service: ENT;  Laterality: N/A;  Revision of Thyroglossal Duct Cyst Excision  . VIDEO BRONCHOSCOPY Bilateral 06/19/2014   Procedure: VIDEO BRONCHOSCOPY WITHOUT FLUORO;  Surgeon: Brand Males, MD;  Location: [place];  Service: Cardiopulmonary;  Laterality: Bilateral;    Family History: Family History  Problem Relation Age of Onset  . Heart disease Mother        CHF (no details)  . Hypertension Mother   . Lupus Mother   . Heart disease Father        Murmur  . Heart disease Sister 74        No details.  History of a pacemaker    Social History: Social History   Socioeconomic History  . Marital status: Single    Spouse name: Not on file  . Number of children: 1  . Years of education: Not on file  . Highest education level: Not on file  Occupational History  . Occupation: CNA  Tobacco Use  . Smoking status: Former Smoker    Packs/day: 0.25    Years: 0.50    Pack years: 0.12     Types: Cigarettes    Quit date: 07/25/1996    Years since quitting: 23.2  .  Smokeless tobacco: Never Used  Substance and Sexual Activity  . Alcohol use: No  . Drug use: No  . Sexual activity: Yes    Birth control/protection: Implant  Other Topics Concern  . Not on file  Social History Narrative   Works at [employer].     Social Determinants of Health   Financial Resource Strain:   . Difficulty of Paying Living Expenses: Not on file  Food Insecurity:   . Worried About Charity fundraiser in the Last Year: Not on file  . Ran Out of Food in the Last Year: Not on file  Transportation Needs:   . Lack of Transportation (Medical): Not on file  . Lack of Transportation (Non-Medical): Not on file  Physical Activity:   . Days of Exercise per Week: Not on file  . Minutes of Exercise per Session: Not on file  Stress:   . Feeling of Stress : Not on file  Social Connections:   . Frequency of Communication with Friends and Family: Not on file  . Frequency of Social Gatherings with Friends and Family: Not on file  . Attends Religious Services: Not on file  . Active Member of Clubs or Organizations: Not on file  . Attends Archivist Meetings: Not on file  . Marital Status: Not on file    Allergies:  Allergies  Allergen Reactions  . 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 Sertraline Hcl Hives  . Tape Other (See Comments)    "Burns" the skin    Objective:    Vital Signs:   Temp:  [98 F (36.7 C)-98.6 F (37 C)] 98.2 F (36.8 C) (01/11 0440) Pulse Rate:  [90-119] 90 (01/11 0440) Resp:  [14-37] 19 (01/11 0440) BP: (94-105)/(63-76) 99/63 (01/11 0440) SpO2:  [90 %-99 %] 99 % (01/11 0440) Weight:  [64.1 kg] 64.1 kg (01/11 0440) Last BM Date: 10/28/19  Weight change: Filed Weights   10/30/19 0630 10/30/19 0636 10/31/19 0440  Weight: 63.5 kg 63.4 kg 64.1 kg    Intake/Output:   Intake/Output Summary  (Last 24 hours) at 10/31/2019 0856 Last data filed at 10/30/2019 2040 Gross per 24 hour  Intake 1060 ml  Output --  Net 1060 ml      Physical Exam    Given COVID positive, I called  The patient on the phone, she confirms her identity by name and birth date She speaks in full sentences at a normal pace, he does not sound SOB  Full exam to follow when she undergoes TEE.   Telemetry   NSR with BiV pacing, personally reviewed  EKG    NSR with BiV pacing, personally reviewed.   Labs   Basic Metabolic Panel: Recent Labs  Lab 10/27/19 0347 10/28/19 0500 10/29/19 0422 10/30/19 0401 10/31/19 0344  NA 137 134* 137 134* 138  K 3.4* 4.1 3.9 3.2* 3.5  CL 99 99 95* 95* 97*  CO2 _0 GLUCOSE 288* 206* 151* 137* 138*  BUN 21* 20 21* 15 20  CREATININE 1.14* 0.90 1.00 0.85 0.94  CALCIUM 8.0* 8.0* 8.3* 8.4* 8.6*    Liver Function Tests: Recent Labs  Lab 10/25/19 0328 10/26/19 0634 10/27/19 0347 10/28/19 0500 10/29/19 0422  AST _1 ALT _2 ALKPHOS 60 55 56 60 66  BILITOT 0.7 0.3 0.5 0.5 0.6  PROT 6.8 6.7  6.9 6.7 7.4  ALBUMIN 2.9* 2.9* 3.0* 2.8* 3.3*   No results for input(s): LIPASE, AMYLASE in the last 168 hours. No results for input(s): AMMONIA in the last 168 hours.  CBC: Recent Labs  Lab 10/25/19 0328 10/26/19 0634 10/27/19 0347 10/28/19 0500 10/29/19 0422 10/30/19 0401 10/31/19 0344  WBC 9.8 7.0 8.2 6.8 5.7 8.4 6.5  NEUTROABS 8.5* 5.6 6.8 4.8 3.4  --   --   HGB 8.5* 8.3* 8.4* 8.4* 9.7* 9.6* 8.6*  HCT 30.0* 30.8* 30.0* 30.9* 35.1* 34.5* 32.4*  MCV 72.5* 74.4* 73.0* 74.3* 74.1* 72.9* 76.1*  PLT 362 390 352 388 PLATELET CLUMPS NOTED ON SMEAR, UNABLE TO ESTIMATE 385 380    Cardiac Enzymes: No results for input(s): CKTOTAL, CKMB, CKMBINDEX, TROPONINI in the last 168 hours.  BNP: BNP (last 3 results) Recent Labs    06/25/19 1625 10/03/19 1654 10/23/19 2300  BNP 74.7 50.7 133.7*    ProBNP (last 3 results) No  results for input(s): PROBNP in the last 8760 hours.   CBG: Recent Labs  Lab 10/30/19 1559 10/30/19 2021 10/31/19 0018 10/31/19 0438 10/31/19 0814  GLUCAP 155* 235* 195* 199* 102*    Coagulation Studies: No results for input(s): LABPROT, INR in the last 72 hours.   Imaging    No results found.   Medications:     Current Medications: . bisoprolol  5 mg Oral QPM  . Chlorhexidine Gluconate Cloth  6 each Topical Daily  . dexamethasone  6 mg Oral Q24H  . enoxaparin (LOVENOX) injection  40 mg Subcutaneous Daily  . ferrous sulfate  325 mg Oral Q breakfast  . fluticasone furoate-vilanterol  1 puff Inhalation Daily  . insulin aspart  0-9 Units Subcutaneous TID WC & HS  . insulin glargine  15 Units Subcutaneous QHS  . loratadine  10 mg Oral Daily  . pantoprazole  40 mg Oral Daily  . rifampin  600 mg Oral Daily  . torsemide  20 mg Oral Daily     Infusions: .  ceFAZolin (ANCEF) IV 2 g (10/31/19 0349)      Assessment/Plan   1. COVID-19 PNA: She has been treated with course of Remdesivir and remains on dexamethasone.  She is now on her baseline oxygen.   2. MSSA bacteremia: Concerning given presence of MDT CRT-P device.  ID has seen, recommend TEE.   - I discussed risks/benefits of TEE with patient today, she agrees to proceed (scheduled this afternoon).  If she has vegetation on leads, will need device out (complicated as she is pacemaker dependent).  - Continue cefazolin and rifamipin per ID, course will depend on presence or absence of vegetation.  3. Chronic systolic HF:  Echo 03/5536 EF 20-25%, echo 1/19 improved with EF 45-50% and mild-moderately decreased systolic function. Echo in 2/20 with EF 35-40%, echo this admission was stable with EF 35-40%.  Medtronic CRT-P device, pacer dependent given congenital CHB.  Breathing is at baseline.  Medication titration has been limited in the past by low BP and orthostatic symptoms.  - Continue torsemide 20 mg daily.   -  Continue bisoprolol 5 mg daily.  - May be able to add spironolactone 12.5 daily while in hospiotal.   4. Anti-synthetase syndrome with interstitial lung disease: This is associated with elevated ant-Jo1 Ab, ILD, and myositis.  CK has been persistently elevated.  She is followed by rheumatology and is on Cellcept, tacrolimus, and prednisone.   - Cellcept/tacrolimus held for now per her rheumatologist.  - She  is off prednisone while on dexamethasone.  5. Congenital CHB s/p pacemaker.   Length of Stay: 7  Loralie Champagne, MD  10/31/2019, 8:56 AM  Advanced Heart Failure Team Pager [phone] (M-F; 7a - 4p)  Please contact [facility] for night-coverage after hours (4p -7a ) and weekends on [email or web address]

## 2019-10-31 NOTE — Anesthesia Postprocedure Evaluation (Signed)
Anesthesia Post Note  Patient: Makayla Vasquez  Procedure(s) Performed: TRANSESOPHAGEAL ECHOCARDIOGRAM (TEE) (N/A )     Patient location during evaluation: Endoscopy Anesthesia Type: MAC Level of consciousness: awake and alert Pain management: pain level controlled Vital Signs Assessment: post-procedure vital signs reviewed and stable Respiratory status: spontaneous breathing, nonlabored ventilation, respiratory function stable and patient connected to nasal cannula oxygen Cardiovascular status: stable and blood pressure returned to baseline Postop Assessment: no apparent nausea or vomiting Anesthetic complications: no    Last Vitals:  Vitals:   10/31/19 1617 10/31/19 1639  BP: 93/67   Pulse: (!) 118 (!) 109  Resp: 20 20  Temp: 36.6 C   SpO2: 97% 97%    Last Pain:  Vitals:   10/31/19 1617  TempSrc: Axillary  PainSc:                  Deysha Cartier COKER

## 2019-10-31 NOTE — Consult Note (Signed)
Advanced Heart Failure Team Consult Note   Primary Physician: Nuala Alpha, DO PCP-Cardiologist:  No primary care provider on file.  Reason for Consultation: COVID-19 in CHF clinic patient, MSSA bacteremia with device and needs TEE  HPI:    Makayla Vasquez is seen today for evaluation of MSSA bacteremia/chronic CHF at the request of Dr. Lovena Le.   Makayla Vasquez is 39 y.o. 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   She has a h/o congenital HB and underwent first PPM at age 41. In 2011 had ECHO with EF 35-40%. LV dysfunction felt to be due to RV pacing so upgraded to CRT-P. Echo in 10/15 with EF 25%.  She also has concomitant lung disease.   She follows with Dr. Annamaria Boots for her lung disease. She is a former smoker.  Admitted in 8/15 and 9/15 with worsening respiratory status and severe hypoxia. Had extensive w/u. Thought to have a viral pneumonitis complicated by atelectasis and mild edema vs. an autoimmune process. Discharged home on a course of levaquin and prednisone.  She was offered a lung biopsy for further diagnosis versus empiric treatment with steroids. She opted for empiric steroid treatment. She is followed by a rheumatologist and was diagnosed with anti-synthetase syndrome with ILD.  She has been on Cellcept, prednisone, and tacrolimus.  She is on oxygen 2-4 L at home.  Admission in 5/17 for ?sepsis and management of interstitial lung disease.   She was admitted with PNA in 1/18.  Lasix, losartan, and bisoprolol stopped.   Echo in 1/19 showed EF 45-50%, with mild to moderately decreased RV systolic function and moderate MR.  Echo in 2/20 showed EF 35-40% with normal RV size and mildly decreased RV systolic function.   She was admitted earlier this month after developing cough and worsening dyspnea.  She was found to have COVID-19 PNA.  She has completed  Remdesivir course and is getting dexamethasone.  Blood cultures showed 1/4 positive for MSSA. Oxygen level down to 3L [place], which is around her baseline.  She is afebrile.  She reports that her breathing is at baseline, she is on torsemide 20 mg daily.  Main complaint is persistent cough.  She was seen by ID, TEE recommended given device with MSSA bacteremia.   Review of Systems: All systems reviewed and negative except as per HPI.   Home Medications Prior to Admission medications   Medication Sig Start Date End Date Taking? Authorizing Provider  albuterol (VENTOLIN HFA) 108 (90 Base) MCG/ACT inhaler Inhale 2 puffs into the lungs every 4 (four) hours as needed for wheezing or shortness of breath. 02/25/19  Yes Tammi Klippel, Sherin, DO  bisoprolol (ZEBETA) 5 MG tablet Take 1 tablet (5 mg total) by mouth daily. 08/09/19  Yes Clegg, Amy D, NP  dextromethorphan-guaiFENesin (ROBITUSSIN-DM) 10-100 MG/5ML liquid TAKE 5 MLS BY MOUTH EVERY 4 (FOUR) HOURS AS NEEDED FOR COUGH. Patient taking differently: Take 5 mLs by mouth every 4 (four) hours as needed for cough.  10/27/18  Yes [place] Bing, DO  Etonogestrel (NEXPLANON [place]) Inject 1 Device into the skin once. Every 3 years   Yes [provider]  ferrous sulfate 325 (65 FE) MG EC tablet Take 1 tablet (325 mg total) by mouth daily with breakfast. 08/11/19 02/07/20 Yes Lockamy, Timothy, DO  furosemide (LASIX) 40 MG tablet TAKE 1 TABLET BY MOUTH EVERY DAY Patient taking differently: Take 40  mg by mouth daily.  10/17/19  Yes [name], MD  LANTUS SOLOSTAR 100 UNIT/ML Solostar Pen INJECT [place] DAY Patient taking differently: Inject 15 Units into the skin daily.  01/05/19  Yes [place] Bing, DO  loratadine (CLARITIN) 10 MG tablet Take 1 tablet (10 mg total) by mouth daily. 08/15/19  Yes Young, Tarri Fuller D, MD  metFORMIN (GLUCOPHAGE) 1000 MG tablet TAKE 1 TABLET BY MOUTH TWICE DAILY WITH A MEAL Patient taking differently: Take  1,000 mg by mouth 2 (two) times daily with a meal.  03/22/19  Yes McMullen, David J, DO  NOVOLOG FLEXPEN 100 UNIT/ML FlexPen INJECT 8 UNITS INTO THE SKIN 3 TIMES DAILY WITH MEALS. Patient taking differently: Inject 8 Units into the skin 3 (three) times daily with meals.  11/02/18  Yes [place] Bing, DO  omeprazole (PRILOSEC) 20 MG capsule TAKE 1 CAPSULE BY MOUTH EVERY DAY Patient taking differently: Take 20 mg by mouth daily.  05/02/19  Yes Young, Tarri Fuller D, MD  OXYGEN Inhale 2-4 L into the lungs continuous.    Yes [provider]  potassium chloride SA (K-DUR,KLOR-CON) 20 MEQ tablet Take 1 tablet (20 mEq total) by mouth daily. 02/01/19  Yes Bensimhon, Shaune Pascal, MD  predniSONE (DELTASONE) 10 MG tablet Take 1 tablet (10 mg total) by mouth daily with breakfast. 09/08/19  Yes Young, Clinton D, MD  tacrolimus (PROGRAF) 0.5 MG capsule Take 1.5 mg by mouth 2 (two) times daily. 11/27/17  Yes [provider]  traZODone (DESYREL) 50 MG tablet TAKE 1 TABLET (50 MG TOTAL) BY MOUTH AT BEDTIME AS NEEDED FOR SLEEP. 09/27/18  Yes [place] Bing, DO  Alcohol Swabs (B-D SINGLE USE SWABS REGULAR) PADS 1 application by Does not apply route 4 (four) times daily. 03/15/19   [place] Bing, DO  benzonatate (TESSALON PERLES) 100 MG capsule Take 1 capsule (100 mg total) by mouth 3 (three) times daily as needed for cough. Patient not taking: Reported on 10/24/2019 10/17/19   Stark Klein, MD  Blood Glucose Calibration (ACCU-CHEK AVIVA) SOLN 1 application by In Vitro route 4 (four) times daily. 03/18/19   [place] Bing, DO  blood glucose meter kit and supplies Dispense based on patient and insurance preference. Use up to four times daily as directed. (FOR ICD-10 E10.9, E11.9). 03/18/19    Bing, DO  cromolyn (NASALCROM) 5.2 MG/ACT nasal spray Place 1 spray into both nostrils 4 (four) times daily. Patient not taking: Reported on 10/24/2019 08/15/19   Baird Lyons D, MD  fluticasone  furoate-vilanterol (BREO ELLIPTA) 100-25 MCG/INH AEPB Inhale 1 puff into the lungs daily. Patient not taking: Reported on 10/24/2019 06/07/19   Baird Lyons D, MD  Fluticasone-Umeclidin-Vilant (TRELEGY ELLIPTA) 100-62.5-25 MCG/INH AEPB Inhale 1 puff into the lungs daily. Patient not taking: Reported on 08/15/2019 07/19/19   Baird Lyons D, MD  mycophenolate (CELLCEPT) 500 MG tablet Take 1,000 mg by mouth 2 (two) times daily.     [provider]    Past Medical History: Past Medical History:  Diagnosis Date  . Anxiety   . Arthritis    rheumatoid arthritis- mild, no rheumatology care   . Asthma    seasonal allergies   . Asymptomatic LV dysfunction    a. Echo in Dec 2011 with EF 35 to 40%. Felt to be due to paced rhythm. b. EF 25-30% in 07/2014.  . Bipolar affective disorder ([place])   . Cardiac pacemaker    a. Since age 77  in 1997. b. Upgrade to BiV in 2013.  [name] Carpal tunnel syndrome of right wrist   . CHF (congestive heart failure) ([place])   . Congenital complete AV block   . Cor pulmonale, chronic ([place]) 04/08/2018  . Depression    bipolar  . Diabetes mellitus without complication ([place])   . GERD (gastroesophageal reflux disease)   . Hypertension   . Interstitial lung disease ([place])   . Lupus ([place])   . Lupus (systemic lupus erythematosus) ([place])   . Obesity   . Pneumonitis    a. a/w hypoxia - inflammatory - large workup 07/2014.  . Presence of permanent cardiac pacemaker   . Seizures ([place])    as a child- from high fever  . Sinus tachycardia     Past Surgical History: Past Surgical History:  Procedure Laterality Date  . ATRIAL TACH ABLATION N/A 08/14/2014   Procedure: ATRIAL TACH ABLATION;  Surgeon: [name], MD;  Location: [facility] CATH LAB;  Service: Cardiovascular;  Laterality: N/A;  . BI-VENTRICULAR PACEMAKER UPGRADE N/A 03/08/2012   Procedure: BI-VENTRICULAR PACEMAKER UPGRADE;  Surgeon: [name], MD;  Location: [facility] CATH LAB;  Service: Cardiovascular;  Laterality:  N/A;  . CARPAL TUNNEL WITH CUBITAL TUNNEL Right 07/26/2013   Procedure: RIGHT LIMITED OPEN CARPAL TUNNEL RELEASE ,  RIGHT CUBITAL TUNNEL RELEASE, INSITU VERSES ULNAR NERVE DECOMPRESSION AND ANTERIOR TRANSPOSITION;  Surgeon: [name], MD;  Location: [place];  Service: Orthopedics;  Laterality: Right;  . CESAREAN SECTION    . CHOLECYSTECTOMY    . CYST EXCISION  12/10/2012   THYROID  . INSERT / REPLACE / REMOVE PACEMAKER     2001  . IUD REMOVAL  11/03/2011   Procedure: INTRAUTERINE DEVICE (IUD) REMOVAL;  Surgeon: [name], MD;  Location: [place] [facility];  Service: Gynecology;  Laterality: N/A;  . NASAL FRACTURE SURGERY     /w plate   . RIGHT HEART CATHETERIZATION N/A 10/26/2014   Procedure: RIGHT HEART CATH;  Surgeon: [name], MD;  Location: [facility] CATH LAB;  Service: Cardiovascular;  Laterality: N/A;  . THROAT SURGERY  1994   s/p laser treatment  . THYROGLOSSAL DUCT CYST N/A 12/10/2012   Procedure: REVISION OF THYROGLOSSAL DUCT CYST EXCISION;  Surgeon: [name], MD;  Location: [place];  Service: ENT;  Laterality: N/A;  Revision of Thyroglossal Duct Cyst Excision  . VIDEO BRONCHOSCOPY Bilateral 06/19/2014   Procedure: VIDEO BRONCHOSCOPY WITHOUT FLUORO;  Surgeon: Brand Males, MD;  Location: [place];  Service: Cardiopulmonary;  Laterality: Bilateral;    Family History: Family History  Problem Relation Age of Onset  . Heart disease Mother        CHF (no details)  . Hypertension Mother   . Lupus Mother   . Heart disease Father        Murmur  . Heart disease Sister 24        No details.  History of a pacemaker    Social History: Social History   Socioeconomic History  . Marital status: Single    Spouse name: Not on file  . Number of children: 1  . Years of education: Not on file  . Highest education level: Not on file  Occupational History  . Occupation: CNA  Tobacco Use  . Smoking status: Former Smoker    Packs/day: 0.25    Years: 0.50    Pack years: 0.12     Types: Cigarettes    Quit date: 07/25/1996    Years since quitting: 23.2  .  Smokeless tobacco: Never Used  Substance and Sexual Activity  . Alcohol use: No  . Drug use: No  . Sexual activity: Yes    Birth control/protection: Implant  Other Topics Concern  . Not on file  Social History Narrative   Works at [employer].     Social Determinants of Health   Financial Resource Strain:   . Difficulty of Paying Living Expenses: Not on file  Food Insecurity:   . Worried About Charity fundraiser in the Last Year: Not on file  . Ran Out of Food in the Last Year: Not on file  Transportation Needs:   . Lack of Transportation (Medical): Not on file  . Lack of Transportation (Non-Medical): Not on file  Physical Activity:   . Days of Exercise per Week: Not on file  . Minutes of Exercise per Session: Not on file  Stress:   . Feeling of Stress : Not on file  Social Connections:   . Frequency of Communication with Friends and Family: Not on file  . Frequency of Social Gatherings with Friends and Family: Not on file  . Attends Religious Services: Not on file  . Active Member of Clubs or Organizations: Not on file  . Attends Archivist Meetings: Not on file  . Marital Status: Not on file    Allergies:  Allergies  Allergen Reactions  . 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 Sertraline Hcl Hives  . Tape Other (See Comments)    "Burns" the skin    Objective:    Vital Signs:   Temp:  [98 F (36.7 C)-98.6 F (37 C)] 98.2 F (36.8 C) (01/11 0440) Pulse Rate:  [90-119] 90 (01/11 0440) Resp:  [14-37] 19 (01/11 0440) BP: (94-105)/(63-76) 99/63 (01/11 0440) SpO2:  [90 %-99 %] 99 % (01/11 0440) Weight:  [64.1 kg] 64.1 kg (01/11 0440) Last BM Date: 10/28/19  Weight change: Filed Weights   10/30/19 0630 10/30/19 0636 10/31/19 0440  Weight: 63.5 kg 63.4 kg 64.1 kg    Intake/Output:   Intake/Output Summary  (Last 24 hours) at 10/31/2019 0856 Last data filed at 10/30/2019 2040 Gross per 24 hour  Intake 1060 ml  Output --  Net 1060 ml      Physical Exam    Given COVID positive, I called  The patient on the phone, she confirms her identity by name and birth date She speaks in full sentences at a normal pace, he does not sound SOB  Full exam to follow when she undergoes TEE.   Telemetry   NSR with BiV pacing, personally reviewed  EKG    NSR with BiV pacing, personally reviewed.   Labs   Basic Metabolic Panel: Recent Labs  Lab 10/27/19 0347 10/28/19 0500 10/29/19 0422 10/30/19 0401 10/31/19 0344  NA 137 134* 137 134* 138  K 3.4* 4.1 3.9 3.2* 3.5  CL 99 99 95* 95* 97*  CO2 _0 GLUCOSE 288* 206* 151* 137* 138*  BUN 21* 20 21* 15 20  CREATININE 1.14* 0.90 1.00 0.85 0.94  CALCIUM 8.0* 8.0* 8.3* 8.4* 8.6*    Liver Function Tests: Recent Labs  Lab 10/25/19 0328 10/26/19 0634 10/27/19 0347 10/28/19 0500 10/29/19 0422  AST _1 ALT _2 ALKPHOS 60 55 56 60 66  BILITOT 0.7 0.3 0.5 0.5 0.6  PROT 6.8 6.7  6.9 6.7 7.4  ALBUMIN 2.9* 2.9* 3.0* 2.8* 3.3*   No results for input(s): LIPASE, AMYLASE in the last 168 hours. No results for input(s): AMMONIA in the last 168 hours.  CBC: Recent Labs  Lab 10/25/19 0328 10/26/19 0634 10/27/19 0347 10/28/19 0500 10/29/19 0422 10/30/19 0401 10/31/19 0344  WBC 9.8 7.0 8.2 6.8 5.7 8.4 6.5  NEUTROABS 8.5* 5.6 6.8 4.8 3.4  --   --   HGB 8.5* 8.3* 8.4* 8.4* 9.7* 9.6* 8.6*  HCT 30.0* 30.8* 30.0* 30.9* 35.1* 34.5* 32.4*  MCV 72.5* 74.4* 73.0* 74.3* 74.1* 72.9* 76.1*  PLT 362 390 352 388 PLATELET CLUMPS NOTED ON SMEAR, UNABLE TO ESTIMATE 385 380    Cardiac Enzymes: No results for input(s): CKTOTAL, CKMB, CKMBINDEX, TROPONINI in the last 168 hours.  BNP: BNP (last 3 results) Recent Labs    06/25/19 1625 10/03/19 1654 10/23/19 2300  BNP 74.7 50.7 133.7*    ProBNP (last 3 results) No  results for input(s): PROBNP in the last 8760 hours.   CBG: Recent Labs  Lab 10/30/19 1559 10/30/19 2021 10/31/19 0018 10/31/19 0438 10/31/19 0814  GLUCAP 155* 235* 195* 199* 102*    Coagulation Studies: No results for input(s): LABPROT, INR in the last 72 hours.   Imaging    No results found.   Medications:     Current Medications: . bisoprolol  5 mg Oral QPM  . Chlorhexidine Gluconate Cloth  6 each Topical Daily  . dexamethasone  6 mg Oral Q24H  . enoxaparin (LOVENOX) injection  40 mg Subcutaneous Daily  . ferrous sulfate  325 mg Oral Q breakfast  . fluticasone furoate-vilanterol  1 puff Inhalation Daily  . insulin aspart  0-9 Units Subcutaneous TID WC & HS  . insulin glargine  15 Units Subcutaneous QHS  . loratadine  10 mg Oral Daily  . pantoprazole  40 mg Oral Daily  . rifampin  600 mg Oral Daily  . torsemide  20 mg Oral Daily     Infusions: .  ceFAZolin (ANCEF) IV 2 g (10/31/19 0349)      Assessment/Plan   1. COVID-19 PNA: She has been treated with course of Remdesivir and remains on dexamethasone.  She is now on her baseline oxygen.   2. MSSA bacteremia: Concerning given presence of MDT CRT-P device.  ID has seen, recommend TEE.   - I discussed risks/benefits of TEE with patient today, she agrees to proceed (scheduled this afternoon).  If she has vegetation on leads, will need device out (complicated as she is pacemaker dependent).  - Continue cefazolin and rifamipin per ID, course will depend on presence or absence of vegetation.  3. Chronic systolic HF:  Echo 06/7988 EF 20-25%, echo 1/19 improved with EF 45-50% and mild-moderately decreased systolic function. Echo in 2/20 with EF 35-40%, echo this admission was stable with EF 35-40%.  Medtronic CRT-P device, pacer dependent given congenital CHB.  Breathing is at baseline.  Medication titration has been limited in the past by low BP and orthostatic symptoms.  - Continue torsemide 20 mg daily.   -  Continue bisoprolol 5 mg daily.  - May be able to add spironolactone 12.5 daily while in hospiotal.   4. Anti-synthetase syndrome with interstitial lung disease: This is associated with elevated ant-Jo1 Ab, ILD, and myositis.  CK has been persistently elevated.  She is followed by rheumatology and is on Cellcept, tacrolimus, and prednisone.   - Cellcept/tacrolimus held for now per her rheumatologist.  - She  is off prednisone while on dexamethasone.  5. Congenital CHB s/p pacemaker.   Length of Stay: 7  Loralie Champagne, MD  10/31/2019, 8:56 AM  Advanced Heart Failure Team Pager [phone] (M-F; 7a - 4p)  Please contact [facility] for night-coverage after hours (4p -7a ) and weekends on [email or web address]

## 2019-10-31 NOTE — Transfer of Care (Signed)
Immediate Anesthesia Transfer of Care Note  Patient: Makayla Vasquez  Procedure(s) Performed: TRANSESOPHAGEAL ECHOCARDIOGRAM (TEE) (N/A )  Patient Location: Endoscopy Unit  Anesthesia Type:MAC  Level of Consciousness: awake, alert  and oriented  Airway & Oxygen Therapy: Patient Spontanous Breathing and Patient connected to nasal cannula oxygen  Post-op Assessment: Report given to RN and Post -op Vital signs reviewed and stable  Post vital signs: Reviewed and stable  Last Vitals:  Vitals Value Taken Time  BP 115/77   Temp    Pulse 115   Resp 16   SpO2 100     Last Pain:  Vitals:   10/31/19 1415  TempSrc: Axillary  PainSc: 2          Complications: No apparent anesthesia complications

## 2019-10-31 NOTE — Progress Notes (Signed)
Patient ID: Makayla Vasquez, female   DOB: 02-20-1981, 38 y.o.   MRN: [id number]         [facility] for Infectious Disease    Date of Admission:  10/23/2019   Day 7 cefazolin and rifampin         [name] had transient MSSA bacteremia superimposed on COVID-19 infection.  Repeat blood cultures were negative and she has had a PICC placed.  We will need to clarify with cardiology the timing of her TEE. This is needed to determine optimal duration antibiotic therapy.        Cliffton Asters, MD [facility] for Infectious Disease [facility] [phone] pager   [phone] cell 10/31/2019, 10:14 AM

## 2019-10-31 NOTE — Anesthesia Preprocedure Evaluation (Addendum)
Anesthesia Evaluation  Patient identified by MRN, date of birth, ID band Patient awake    Reviewed: Allergy & Precautions, NPO status , Patient's Chart, lab work & pertinent test results  Airway Mallampati: II  TM Distance: >3 FB Neck ROM: Full    Dental  (+) Teeth Intact   Pulmonary former smoker,     + decreased breath sounds      Cardiovascular hypertension,  Rhythm:Regular Rate:Tachycardia     Neuro/Psych    GI/Hepatic   Endo/Other  diabetes  Renal/GU      Musculoskeletal   Abdominal   Peds  Hematology   Anesthesia Other Findings   Reproductive/Obstetrics                            Anesthesia Physical Anesthesia Plan  ASA: III  Anesthesia Plan: MAC   Post-op Pain Management:    Induction: Intravenous  PONV Risk Score and Plan: Ondansetron and Propofol infusion  Airway Management Planned: Nasal Cannula and Natural Airway  Additional Equipment:   Intra-op Plan:   Post-operative Plan:   Informed Consent: I have reviewed the patients History and Physical, chart, labs and discussed the procedure including the risks, benefits and alternatives for the proposed anesthesia with the patient or authorized representative who has indicated his/her understanding and acceptance.     Dental advisory given  Plan Discussed with: CRNA and Anesthesiologist  Anesthesia Plan Comments:         Anesthesia Quick Evaluation

## 2019-10-31 NOTE — Progress Notes (Signed)
Patient ID: Makayla Vasquez, female   DOB: Sep 23, 1981, 39 y.o.   MRN: [id number]          [facility] for Infectious Disease    Date of Admission:  10/23/2019   Total days of antibiotics 7  Dr. Aundra Dubin reported that no vegetations were seen on her pacemaker lead were heart valves.  I recommend treating her MSSA bacteremia with IV cefazolin and rifampin for 3 more weeks.  I will arrange follow-up in my clinic and sign off now.  Diagnosis: Bacteremia  Culture Result: MSSA  Allergies  Allergen Reactions  . 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 Sertraline Hcl Hives  . Tape Other (See Comments)    "Burns" the skin    OPAT Orders Discharge antibiotics: Per pharmacy protocol cefazolin  Duration: 4 weeks End Date: 11/22/2019  [facility] Care Per Protocol:  Labs weekly while on IV antibiotics: _x_ CBC with differential _x_ BMP __ CMP __ CRP __ ESR __ Vancomycin trough __ CK  _x_ Please pull PIC at completion of IV antibiotics __ Please leave PIC in place until doctor has seen patient or been notified  Fax weekly labs to [phone]  Clinic Follow Up Appt: 11/30/2019  Michel Bickers, [place] for [place] [phone] pager   [phone] cell 10/31/2019, 3:40 PM

## 2019-11-01 LAB — GLUCOSE, CAPILLARY
Glucose-Capillary: 121 mg/dL — ABNORMAL HIGH (ref 70–99)
Glucose-Capillary: 123 mg/dL — ABNORMAL HIGH (ref 70–99)
Glucose-Capillary: 141 mg/dL — ABNORMAL HIGH (ref 70–99)
Glucose-Capillary: 148 mg/dL — ABNORMAL HIGH (ref 70–99)
Glucose-Capillary: 172 mg/dL — ABNORMAL HIGH (ref 70–99)
Glucose-Capillary: 183 mg/dL — ABNORMAL HIGH (ref 70–99)
Glucose-Capillary: 96 mg/dL (ref 70–99)

## 2019-11-01 LAB — CBC
HCT: 29.8 % — ABNORMAL LOW (ref 36.0–46.0)
HCT: 31.1 % — ABNORMAL LOW (ref 36.0–46.0)
Hemoglobin: 7.9 g/dL — ABNORMAL LOW (ref 12.0–15.0)
Hemoglobin: 8.2 g/dL — ABNORMAL LOW (ref 12.0–15.0)
MCH: 20.4 pg — ABNORMAL LOW (ref 26.0–34.0)
MCH: 20.3 pg — ABNORMAL LOW (ref 26.0–34.0)
MCHC: 26.5 g/dL — ABNORMAL LOW (ref 30.0–36.0)
MCHC: 26.4 g/dL — ABNORMAL LOW (ref 30.0–36.0)
MCV: 76.8 fL — ABNORMAL LOW (ref 80.0–100.0)
MCV: 77 fL — ABNORMAL LOW (ref 80.0–100.0)
Platelets: 360 10*3/uL (ref 150–400)
Platelets: 365 10*3/uL (ref 150–400)
RBC: 3.88 MIL/uL (ref 3.87–5.11)
RBC: 4.04 MIL/uL (ref 3.87–5.11)
RDW: 18.5 % — ABNORMAL HIGH (ref 11.5–15.5)
RDW: 18.4 % — ABNORMAL HIGH (ref 11.5–15.5)
WBC: 6.4 10*3/uL (ref 4.0–10.5)
WBC: 6.2 10*3/uL (ref 4.0–10.5)
nRBC: 0 % (ref 0.0–0.2)
nRBC: 0 % (ref 0.0–0.2)

## 2019-11-01 LAB — BASIC METABOLIC PANEL
Anion gap: 10 (ref 5–15)
BUN: 19 mg/dL (ref 6–20)
CO2: 30 mmol/L (ref 22–32)
Calcium: 8.7 mg/dL — ABNORMAL LOW (ref 8.9–10.3)
Chloride: 97 mmol/L — ABNORMAL LOW (ref 98–111)
Creatinine, Ser: 0.85 mg/dL (ref 0.44–1.00)
GFR calc Af Amer: >60 mL/min (ref >60)
GFR calc non Af Amer: >60 mL/min (ref >60)
Glucose, Bld: 205 mg/dL — ABNORMAL HIGH (ref 70–99)
Potassium: 3.9 mmol/L (ref 3.5–5.1)
Sodium: 137 mmol/L (ref 135–145)

## 2019-11-01 MED ORDER — RIFAMPIN 300 MG PO CAPS: 600.0000 mg | ORAL_CAPSULE | Freq: Every day | ORAL | 0 refills | Status: AC

## 2019-11-01 MED ORDER — CEFAZOLIN SODIUM-DEXTROSE 2-4 GM/100ML-% IV SOLN: 2.0000 g | Freq: Three times a day (TID) | INTRAVENOUS | 0 refills | Status: DC

## 2019-11-01 MED ORDER — TORSEMIDE 20 MG PO TABS: 20.0000 mg | ORAL_TABLET | Freq: Every day | ORAL | 0 refills | Status: DC

## 2019-11-01 MED ORDER — PREDNISONE 10 MG PO TABS: 10.0000 mg | ORAL_TABLET | Freq: Every day | ORAL | 5 refills | Status: DC

## 2019-11-01 MED ORDER — CEFAZOLIN SODIUM-DEXTROSE 2-4 GM/100ML-% IV SOLN
2.0000 g | Freq: Three times a day (TID) | INTRAVENOUS | Status: DC
  Administered 2019-11-01 – 2019-11-02 (×2): 2 g via INTRAVENOUS
  Filled 2019-11-01 (×5): qty 100

## 2019-11-01 MED ORDER — DEXAMETHASONE 6 MG PO TABS: 6.0000 mg | ORAL_TABLET | ORAL | 0 refills | Status: DC

## 2019-11-01 MED ORDER — CEFAZOLIN IV (FOR PTA / DISCHARGE USE ONLY): 2.0000 g | Freq: Three times a day (TID) | INTRAVENOUS | 0 refills | Status: AC

## 2019-11-01 NOTE — TOC Transition Note (Addendum)
Transition of Care [facility]) - CM/SW Discharge Note   Patient Details  Name: Makayla Vasquez MRN: [id number] Date of Birth: 09-01-1981  Transition of Care [facility]) CM/SW Contact:  Cherylann Parr, RN Phone Number: 11/01/2019, 1:54 PM   Clinical Narrative:   PTA independent from home.  Pt informed CM that her family will transport her home at discharge - family will also bring portable tank for transport home.  Pt informed CM that her sister will be with her as recommended at from therapy.  PTA home oxygen supplied by [facility].  Pt has PCP will make televist appt once she is stable at discharge. Pt will go home on IV antibiotics. CM provided medicare.gov HH list choice- pt chose [facility] and [facility].  [facility] unable to accept referral.  Pt does not have a second preference for [facility].  [facility]  Pt will get her pm dose today before she leaves the hospital  Per attending group pt is not appropriate for Covid hospital to home program  Update:  CM informed that [facility] can no longer accept pt due to delayed start of care date secondary to staffing.   Neither [facility], [facility]can accept pt.  [facility] liaison also exhausted list for [facility].    [facility] secured HH with [facility] to begin tomorrow or Thursday depending  on staffing.  [facility] provided teaching directly to sister (pt could not be trained due to covid).  Per [facility] pts sister is in agreement with delayed start of care Attending to make final decision regarding if pt is safe for discharge.  TOC will continue to follow   CM informed by attending that pt is now stating she does not have 24/7 supervision at discharge.  Discharge order cancelled.     Final next level of care: Home w Home Health Services     Patient Goals and CMS Choice   CMS Medicare.gov Compare Post Acute Care list provided to:: Patient Choice offered to / list presented to : Patient  Discharge Placement                       Discharge Plan and Services          DME Arranged: IV pump/equipment DME Agency: Other - Comment([facility]) Date DME Agency Contacted: 11/01/19 Time DME Agency Contacted: 1347 Representative spoke with at DME Agency: Pam            Social Determinants of Health (SDOH) Interventions     Readmission Risk Interventions No flowsheet data found.

## 2019-11-01 NOTE — Progress Notes (Signed)
Physical Therapy Treatment Patient Details Name: Makayla Vasquez MRN: [id number] DOB: Mar 10, 1981 Today's Date: 11/01/2019    History of Present Illness Makayla Vasquez is a 39 y.o. female presenting with staph aureus bactermia superimposed on COVID-19. PMH is significant for lupus, antisynthetase syndrome (anti-Jo-1 antibody, interstitial lung disease, myositis and elevated CK), acute on chronic respiratory failure/hypoxemia, pacemaker for congenital AV block, pulmonary hypertension, chronic dilated cardiomyopathy, T2DM, GERD, HLD.    PT Comments    Pt with limited participation with therapy today due to being cold. Pt provided with warm blankets, and then only agreeable to performing sit>stand at [facility]. Pt HR at rest 116 bpm with 10x sit>stand increased to 146 bpm requiring 3 minutes to return to 110s. Pt is hopeful for d/c home today. PT will continue to follow if that does not happen.    Follow Up Recommendations  No PT follow up;Supervision/Assistance - 24 hour     Equipment Recommendations  None recommended by PT       Precautions / Restrictions Restrictions Weight Bearing Restrictions: No    Mobility  Bed Mobility Overal bed mobility: Independent             General bed mobility comments: with HOB elevated   Transfers Overall transfer level: Needs assistance Equipment used: None Transfers: Sit to/from Stand;Stand Pivot Transfers Sit to Stand: Supervision            Ambulation/Gait             General Gait Details: refuses due to it being cold in her room        Balance Overall balance assessment: Mild deficits observed, not formally tested                                          Cognition Arousal/Alertness: Awake/alert Behavior During Therapy: Flat affect Overall Cognitive Status: Within Functional Limits for tasks assessed                                 General Comments: Grossly [facility]       Exercises Other  Exercises Other Exercises: sit>stand x 10, 5 with UE support on LE, 5 without UE support    General Comments General comments (skin integrity, edema, etc.): Resting HR 116bpm, with exercise increased to a max 146 bpm requiring 3 min to return to baseline       Pertinent Vitals/Pain Pain Assessment: No/denies pain           PT Goals (current goals can now be found in the care plan section) Acute Rehab PT Goals Patient Stated Goal: to feel better PT Goal Formulation: With patient Time For Goal Achievement: 11/13/19 Potential to Achieve Goals: Fair Progress towards PT goals: Not progressing toward goals - comment(pt with limited participation today )    Frequency    Min 2X/week      PT Plan Current plan remains appropriate    Co-evaluation              AM-PAC PT "6 Clicks" Mobility   Outcome Measure  Help needed turning from your back to your side while in a flat bed without using bedrails?: None Help needed moving from lying on your back to sitting on the side of a flat bed without using bedrails?: None Help needed moving to and  from a bed to a chair (including a wheelchair)?: A Little Help needed standing up from a chair using your arms (e.g., wheelchair or bedside chair)?: A Little Help needed to walk in hospital room?: A Little Help needed climbing 3-5 steps with a railing? : A Lot 6 Click Score: 19    End of Session Equipment Utilized During Treatment: Oxygen Activity Tolerance: Patient limited by fatigue Patient left: in chair;with call bell/phone within reach Nurse Communication: Mobility status PT Visit Diagnosis: Unsteadiness on feet (R26.81);Muscle weakness (generalized) (M62.81)     Time: 1215-1229 PT Time Calculation (min) (ACUTE ONLY): 14 min  Charges:  $Therapeutic Exercise: 8-22 mins                     Tayven Renteria B. Migdalia Dk PT, DPT Acute Rehabilitation Services Pager [phone] Office [phone]    [place] 11/01/2019, 1:52 PM

## 2019-11-01 NOTE — Progress Notes (Addendum)
Family Medicine Teaching Service Daily Progress Note Intern Pager: [phone]  Patient name: Makayla Vasquez Medical record number: [id number] Date of birth: 01-20-1981 Age: 39 y.o. Gender: female  Primary Care Provider: Arlyce Harman, DO Consultants: None Code Status: Full  Pt Overview and Major Events to Date:  1/4-admitted  Assessment and Plan:  COVID-19  acute on chronic respiratory failure  hypoxemia Patient states that her breathing feels ok this AM, O2 sats 99% on 3 L.  Home requirement of 2-4 L.  Completed 5-day course of remdesivir.  Plan to complete 10 day course of Decadronon 1/13. -Continue airborne and contact precautions -Continuous cardiac monitoring pulse ox -Decadron 6 mg p.o. daily for 10 days, day 9 of 10 -Maintain O2 sat greater than 90% via oxygen therapy -Heart healthy/carb modified diet  MSSA bacteremia with pacer Patient with 1/4 blood culture positive for pan-sensitive staph aureus.  TEE showed 35-40% ejection fraction without vegetation.  Repeat blood culture shows no growth. PICC placed on 01/9.   -Infectious disease signed off, recommending continued Cefazolin and PO Rifampin unitl 11/22/19 -Cardiology consulted, appreciate recommendations   Nausea, resolved  - Zofran PRN  HFrEF, stable Wt  69.6 previously 64.1kg. Output  175 net positive 1.7 liters since admission.  -cardiology recommending no changes to BP medications in preparation for discharge  -Continue bisoprolol 5 mg daily -Torsemide 20 mg -Strict I's and O's -Daily weights -Daily BMP, continue to monitor patient's creatinine  Hypokalemia K 3.9 today  -Replete with K-Dur as appropriate -Trend BMP  Congenital AV block s/p pacemaker Congenital heart block underwent first PPM at age 56 (mother had SLE). 2011 had echo with EF 35 to 40%.  The dysfunction was thought to be due to RV pacing so upgraded to CRT-P.  Patient follows with Dr. Ladona Ridgel in EP.  Last battery change was in 2004,  according to notes patient is due for a battery change.  Lupus  Anti-Synthetase Syndrome (anti-Jo-1 antibody, interstitial lung disease, myositis, and elevated CK) Patient is followed by Maxie Barb at [facility] rheumatology.  CK is chronically elevated and is sensitive to steroids.  Home medications include CellCept, tacrolimus, prednisone 10 mg.   -Continue holding CellCept and tacrolimus at this time per recommendations of her rheumatologist -Hold prednisone for Decadron treatment to be completed 11/02/19 -patient will restart hm  prednisone on 1/14   Interstitial lung disease Followed by Dr. Jetty Duhamel with [facility] pulmonology.  Former smoker.  Home medications include albuterol 2 puffs every 4 hours as needed, cromolyn, prednisone 10 mg daily, Breo Ellipta.  Patient is also on 2-4 L of oxygen via nasal cannula at baseline -Continue home medications  Type 2 diabetes CBGs over past 24 hours ranging from 96-186. Last A1c 7.0 on October 03, 2019.  Home medications include Lantus 15 units nightly, Metformin 1000 mg twice daily, NovoLog 8 units 3 times daily before every meal.  Patient currently using 15 units of Lantus had used 2 units of fast acting insulin over last 24 hours. -Hold home medications -Sensitive sliding scale insulin -15 units Lantus per day   GERD Home medications includes omeprazole. -Pantoprazole on formulary   HLD Last lipid profile was in 2016: Total cholesterol 200, HDL 31, LDL 136, triglycerides 163.  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 -Obtain lipid panel in outpatient setting  FEN/GI: Carb modified diet PPx: Lovenox  Disposition: anticipated discharge with continued abx today  Subjective:  Patient reports that her breathing is doing ok this morning and she denies CP or worsening SOB. Patient is excited for discharge.   Objective: Temp:  [96.9 F (36.1 C)-98.1 F (36.7 C)]  97.7 F (36.5 C) (01/12 0450) Pulse Rate:  [97-118] 108 (01/12 1148) Resp:  [17-28] 17 (01/12 1148) BP: (84-119)/(46-78) 97/70 (01/12 0510) SpO2:  [95 %-100 %] 100 % (01/12 1148) Weight:  [69.6 kg] 69.6 kg (01/12 0450)  Physical Exam: General: chronically ill appearing female sitting up in bed in NAD  Heart: no friction rubs appreciated, normal s1 and s2  Lungs: decreased breath sounds bilaterally, normal respiratory effort on 3 liters via [place] Abdomen: soft, NT, + bowel sounds throughout   Laboratory: Recent Labs  Lab 10/31/19 0344 11/01/19 0405 11/01/19 1103  WBC 6.5 6.4 6.2  HGB 8.6* 7.9* 8.2*  HCT 32.4* 29.8* 31.1*  PLT 380 360 365   Recent Labs  Lab 10/27/19 0347 10/28/19 0500 10/29/19 0422 10/30/19 0401 10/31/19 0344 11/01/19 0405  NA 137 134* 137 134* 138 137  K 3.4* 4.1 3.9 3.2* 3.5 3.9  CL 99 99 95* 95* 97* 97*  CO2 26 25 29 27 31 30   BUN 21* 20 21* 15 20 19   CREATININE 1.14* 0.90 1.00 0.85 0.94 0.85  CALCIUM 8.0* 8.0* 8.3* 8.4* 8.6* 8.7*  PROT 6.9 6.7 7.4  --   --   --   BILITOT 0.5 0.5 0.6  --   --   --   ALKPHOS 56 60 66  --   --   --   ALT 9 8 9   --   --   --   AST 16 15 21   --   --   --   GLUCOSE 288* 206* 151* 137* 138* 205*    Imaging/Diagnostic Tests: No new imaging in last 24 hours  Stark Klein, MD 11/01/2019, 1:05 PM PGY-3, [place] Intern pager: [phone], text pages welcome

## 2019-11-01 NOTE — Progress Notes (Signed)
OT Cancellation Note  Patient Details Name: Makayla Vasquez MRN: [id number] DOB: [birth year]   Cancelled Treatment:    Reason Eval/Treat Not Completed: Patient declined, no reason specified, reports "too cold" and generally feeling unwell. Will follow up for OT treatment as able.  [name], OT Supplemental Rehabilitation Services Pager [phone] Office [phone]   [name] 11/01/2019, 5:05 PM

## 2019-11-01 NOTE — Plan of Care (Signed)

## 2019-11-01 NOTE — Progress Notes (Signed)
PT recommending 24-hour supervision.  Social work has been working very diligently to have home health in place.  Unfortunately nothing was arranged today.  Spoke with patient on the phone about having this in place and her feelings about being discharged home..  Patient says that she will not have anyone with her during the day if does not have home health RN to help her with IV antibiotics.  She does say that her boyfriend will be with her in the evening.  Patient does not feel comfortable self administering antibiotics.  She would prefer to wait in the hospital until further image are set up.  This was discussed with Dr. Manson Passey.  We are canceling discharge and will continue to try to find patient home health services.

## 2019-11-01 NOTE — Progress Notes (Signed)
Spoke with [facility] rheumatology concerning her tacrolimus and CellCept.  Recommended continued holding these medications until her follow-up visit with them on 1/21, as this would be approximately 1 week that she has been asymptomatic from COVID-19.  Will restart her chronic prednisone 10 mg on discharge.  Allayne Stack, DO

## 2019-11-01 NOTE — Progress Notes (Signed)
  Reviewed ID note regarding 3 more weeks IV abx.   SBP low, will not adjust cardiac meds.   Continue current regimen for now, no changes.  Will arrange CHF clinic followup.   [name] 11/01/2019

## 2019-11-01 NOTE — Discharge Instructions (Signed)
Thank you for allowing [place] to take part in your care. You were admitted to the hospital due to COVID-19 infection and a bacteria in your blood. You were treated with steroids, remdesivir, and IV antibiotics for these two reasons.  Please continue to take the additional dose of steroids to be completed on 11/02/19.  You will continue with your antibiotics until February when you will follow up with your infectious disease physician on 11/30/19.  Given you history of immunosuppresion and recent diagnosis of COVID-19, we recommend that you self isolate for 21 days since admission. This would be until 11/10/19.   In terms for your diabetes--We recommend holding your mealtime novolog coverage as you have required less while you have been in the hospital. Please continue your 15 U lantus and metformin. At your follow up visit on 1/14 we can discuss whether or not we need to add back your insulin. Please make sure you are checking your sugars at home and keep a journal to discuss at your visit.     10 Things You Can Do to Manage Your COVID-19 Symptoms at Home If you have possible or confirmed COVID-19: 1. Stay home from work and school. And stay away from other public places. If you must go out, avoid using any kind of public transportation, ridesharing, or taxis. 2. Monitor your symptoms carefully. If your symptoms get worse, call your healthcare provider immediately. 3. Get rest and stay hydrated. 4. If you have a medical appointment, call the healthcare provider ahead of time and tell them that you have or may have COVID-19. 5. For medical emergencies, call 911 and notify the dispatch personnel that you have or may have COVID-19. 6. Cover your cough and sneezes with a tissue or use the inside of your elbow. 7. Wash your hands often with soap and water for at least 20 seconds or clean your hands with an alcohol-based hand sanitizer that contains at least 60% alcohol. 8. As much as possible, stay in a  specific room and away from other people in your home. Also, you should use a separate bathroom, if available. If you need to be around other people in or outside of the home, wear a mask. 9. Avoid sharing personal items with other people in your household, like dishes, towels, and bedding. 10. Clean all surfaces that are touched often, like counters, tabletops, and doorknobs. Use household cleaning sprays or wipes according to the label instructions. [email or web address] 04/20/2019 This information is not intended to replace advice given to you by your health care provider. Make sure you discuss any questions you have with your health care provider. Document Revised: 09/22/2019 Document Reviewed: 09/22/2019 Elsevier Patient Education  2020 [employer].

## 2019-11-02 ENCOUNTER — Encounter: Payer: Self-pay | Admitting: *Deleted

## 2019-11-02 LAB — GLUCOSE, CAPILLARY
Glucose-Capillary: 60 mg/dL — ABNORMAL LOW (ref 70–99)
Glucose-Capillary: 188 mg/dL — ABNORMAL HIGH (ref 70–99)
Glucose-Capillary: 109 mg/dL — ABNORMAL HIGH (ref 70–99)

## 2019-11-02 LAB — CBC
HCT: 29.6 % — ABNORMAL LOW (ref 36.0–46.0)
Hemoglobin: 8 g/dL — ABNORMAL LOW (ref 12.0–15.0)
MCH: 20.7 pg — ABNORMAL LOW (ref 26.0–34.0)
MCHC: 27 g/dL — ABNORMAL LOW (ref 30.0–36.0)
MCV: 76.7 fL — ABNORMAL LOW (ref 80.0–100.0)
Platelets: 327 10*3/uL (ref 150–400)
RBC: 3.86 MIL/uL — ABNORMAL LOW (ref 3.87–5.11)
RDW: 18.6 % — ABNORMAL HIGH (ref 11.5–15.5)
WBC: 6.6 10*3/uL (ref 4.0–10.5)
nRBC: 0 % (ref 0.0–0.2)

## 2019-11-02 LAB — BASIC METABOLIC PANEL
Anion gap: 11 (ref 5–15)
BUN: 15 mg/dL (ref 6–20)
CO2: 31 mmol/L (ref 22–32)
Calcium: 8.5 mg/dL — ABNORMAL LOW (ref 8.9–10.3)
Chloride: 94 mmol/L — ABNORMAL LOW (ref 98–111)
Creatinine, Ser: 0.85 mg/dL (ref 0.44–1.00)
GFR calc Af Amer: >60 mL/min (ref >60)
GFR calc non Af Amer: >60 mL/min (ref >60)
Glucose, Bld: 92 mg/dL (ref 70–99)
Potassium: 3.8 mmol/L (ref 3.5–5.1)
Sodium: 136 mmol/L (ref 135–145)

## 2019-11-02 MED ORDER — HEPARIN SOD (PORK) LOCK FLUSH 100 UNIT/ML IV SOLN
250.0000 [IU] | INTRAVENOUS | Status: DC | PRN
  Filled 2019-11-02: qty 2.5

## 2019-11-02 NOTE — Progress Notes (Signed)
Family Medicine Teaching Service Daily Progress Note Intern Pager: [phone]  Patient name: Makayla Vasquez Medical record number: [id number] Date of birth: 05-27-1981 Age: 39 y.o. Gender: female  Primary Care Provider: Arlyce Harman, DO Consultants: None Code Status: Full  Pt Overview and Major Events to Date:  1/4-admitted  Assessment and Plan:  COVID-19  acute on chronic respiratory failure  hypoxemia Patient states that her breathing feels ok this AM, O2 sats 99% on 3 L.  Home requirement of 2-4 L.  Completed 5-day course of remdesivir.  Plan to complete 10 day course of Decadronon 1/13. -Continue airborne and contact precautions -Continuous cardiac monitoring pulse ox -Decadron 6 mg p.o. daily for 10 days, day 9 of 10 -Maintain O2 sat greater than 90% via oxygen therapy -Heart healthy/carb modified diet  MSSA bacteremia with pacer Patient with 1/4 blood culture positive for pan-sensitive staph aureus.  TEE showed 35-40% ejection fraction without vegetation.  Repeat blood culture shows no growth. PICC placed on 01/9.   -Infectious disease signed off, recommending continued Cefazolin and PO Rifampin unitl 11/22/19 -Cardiology consulted, appreciate recommendations   Nausea, resolved  - Zofran PRN  HFrEF, stable Wt   previously 64.1kg. net positive 2.2 liters since admission.  -cardiology recommending no changes to BP medications in preparation for discharge  -Continue bisoprolol 5 mg daily -Torsemide 20 mg -Strict I's and O's -Daily weights -Daily BMP, continue to monitor patient's creatinine  Hypokalemia K 3.9 today  -Replete with K-Dur as appropriate -Trend BMP  Congenital AV block s/p pacemaker Congenital heart block underwent first PPM at age 89 (mother had SLE). 2011 had echo with EF 35 to 40%.  The dysfunction was thought to be due to RV pacing so upgraded to CRT-P.  Patient follows with Dr. Ladona Ridgel in EP.  Last battery change was in 2004, according to notes  patient is due for a battery change.  Lupus  Anti-Synthetase Syndrome (anti-Jo-1 antibody, interstitial lung disease, myositis, and elevated CK) Patient is followed by Maxie Barb at [facility] rheumatology.  CK is chronically elevated and is sensitive to steroids.  Home medications include CellCept, tacrolimus, prednisone 10 mg.   -Continue holding CellCept and tacrolimus at this time per recommendations of her rheumatologist -Hold prednisone for Decadron treatment to be completed 11/02/19 -patient will restart hm  prednisone on 1/14   Interstitial lung disease Followed by Dr. Jetty Duhamel with [facility] pulmonology.  Former smoker.  Home medications include albuterol 2 puffs every 4 hours as needed, cromolyn, prednisone 10 mg daily, Breo Ellipta.  Patient is also on 2-4 L of oxygen via nasal cannula at baseline -Continue home medications  Type 2 diabetes CBGs over past 24 hours ranging from 60-188. Last A1c 7.0 on October 03, 2019.  Home medications include Lantus 15 units nightly, Metformin 1000 mg twice daily, NovoLog 8 units 3 times daily before every meal.  Patient currently using 15 units of Lantus had used 3 units of fast acting insulin over last 24 hours. -Hold home medications -Sensitive sliding scale insulin -15 units Lantus per day    GERD Home medications includes omeprazole. -Pantoprazole on formulary   HLD Last lipid profile was in 2016: Total cholesterol 200, HDL 31, LDL 136, triglycerides 163.  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 -Obtain lipid panel in outpatient setting  FEN/GI: Carb modified diet PPx: Lovenox  Disposition: anticipated discharge with continued abx, working with case management and patient's  family to secure a safe discharge planning and administration of take Antibiotics  Subjective:  Patient dates that her breathing is doing well today, she has no shortness of breath  no chest pain.  Patient has no other complaints.  Objective: Temp:  [97.7 F (36.5 C)-98 F (36.7 C)] 97.8 F (36.6 C) (01/13 0529) Pulse Rate:  [101-126] 101 (01/13 0529) Resp:  [15-27] 15 (01/13 0529) BP: (98)/(67-71) 98/70 (01/13 0529) SpO2:  [98 %-100 %] 100 % (01/13 0529) Weight:  [69 kg] 69 kg (01/13 0500)  Physical Exam: General: chronically ill appearing female sitting up in bed in NAD  Heart: Regular rate and rhythm without friction rub, normal S1-S2 Lungs: Decreased lung sounds throughout both lung fields, no crackles appreciated, no wheezing, patient with normal respiratory effort on nasal cannula 3 L Abdomen: Abdomen is soft and nontender, bowel sounds appreciated throughout  Laboratory: Recent Labs  Lab 11/01/19 0405 11/01/19 1103 11/02/19 0500  WBC 6.4 6.2 6.6  HGB 7.9* 8.2* 8.0*  HCT 29.8* 31.1* 29.6*  PLT 360 365 327   Recent Labs  Lab 10/27/19 0347 10/28/19 0500 10/29/19 0422 10/31/19 0344 11/01/19 0405 11/02/19 0500  NA 137 134* 137 138 137 136  K 3.4* 4.1 3.9 3.5 3.9 3.8  CL 99 99 95* 97* 97* 94*  CO2 26 25 29 31 30 31   BUN 21* 20 21* 20 19 15   CREATININE 1.14* 0.90 1.00 0.94 0.85 0.85  CALCIUM 8.0* 8.0* 8.3* 8.6* 8.7* 8.5*  PROT 6.9 6.7 7.4  --   --   --   BILITOT 0.5 0.5 0.6  --   --   --   ALKPHOS 56 60 66  --   --   --   ALT 9 8 9   --   --   --   AST 16 15 21   --   --   --   GLUCOSE 288* 206* 151* 138* 205* 92    Imaging/Diagnostic Tests: No new imaging in last 24 hours  Stark Klein, MD 11/02/2019, 9:54 AM PGY-3, [place] Intern pager: [phone], text pages welcome

## 2019-11-02 NOTE — TOC Transition Note (Signed)
Transition of Care [facility]) - CM/SW Discharge Note   Patient Details  Name: Makayla Vasquez MRN: [id number] Date of Birth: 1981-10-15  [facility] CM/SW Contact:  Cherylann Parr, RN Phone Number: 11/02/2019, 2:15 PM   Clinical Narrative:  Therapy reassessed pt and concluded that pt will not require 24 hour supervision.   Pt will discharge home today. CM was able to reach pt sister via phone and  Confirmed that sister is in agreement with administering all doses of home antibiotic at discharge that do not correspond with [facility] visits.  [employer] was able to secure [facility] via [facility] that will start tomorrow - previous agency [facility] had a delay in [facility].  CM informed pt and sister of change in [facility] agency and pt is in agreement.   Pt already has follow up televisit set up with Family Medicine tomorrow - FM group declined to reschedule appt as they would like to check in on pt tomorrow.    No other CM needs - discharge order signed- CM signing off            Final next level of care: Home w Home Health Services Barriers to Discharge: Barriers Resolved   Patient Goals and CMS Choice   CMS Medicare.gov Compare Post Acute Care list provided to:: Patient Choice offered to / list presented to : Patient  Discharge Placement                       Discharge Plan and Services                DME Arranged: IV pump/equipment DME Agency: Other - Comment([facility]) Date DME Agency Contacted: 11/01/19 Time DME Agency Contacted: 1347 Representative spoke with at DME Agency: [facility] Agency: Other - See comment([facility] - referral given directly from [employer] [facility] infusion company) Date HH Agency Contacted: 11/02/19 Time HH Agency Contacted: (Contacted by [employer] [facility] infusion liason Pam)    Social Determinants of Health (SDOH) Interventions     Readmission Risk Interventions No flowsheet data found.

## 2019-11-02 NOTE — Progress Notes (Signed)
FPTS Interim Progress Note  Spoke with patient in regards to discharge plan. Patient states that she feels safe discharging home with continued abx via PICC line with assistance of HH and patient's sister on days that Home health is not available. Patient also states that her sister received training on administering abx for the patient while home.   Nicki Guadalajara, MD 11/02/2019, 1:25 PM PGY-1, [facility] Family Medicine Service pager [phone]

## 2019-11-02 NOTE — Care Management Important Message (Signed)
Important Message  Patient Details  Name: Makayla Vasquez MRN: [id number] Date of Birth: 09/10/1981   Medicare Important Message Given:  Yes - Important Message mailed due to current National Emergency  Verbal consent obtained due to current National Emergency  Relationship to patient: Self Contact Name: Cashe Gatt Call Date: 11/02/19  Time: 1418 Phone: [phone] Outcome: Spoke with contact Important Message mailed to: Patient address on file    Orson Aloe 11/02/2019, 2:18 PM

## 2019-11-02 NOTE — Progress Notes (Signed)
Occupational Therapy Treatment Patient Details Name: Makayla Vasquez MRN: [id number] DOB: 1981/03/02 Today's Date: 11/02/2019    History of present illness Makayla Vasquez is a 39 y.o. female presenting with staph aureus bactermia superimposed on COVID-19. PMH is significant for lupus, antisynthetase syndrome (anti-Jo-1 antibody, interstitial lung disease, myositis and elevated CK), acute on chronic respiratory failure/hypoxemia, pacemaker for congenital AV block, pulmonary hypertension, chronic dilated cardiomyopathy, T2DM, GERD, HLD.   OT comments  Focus of session on energy conservation as pt planning for discharge home (likely today). Educated pt in energy conservation strategies, activity pacing, and activity progression with functional/ADL tasks after return home. Handout issued and reviewed. Pt verbalizing understanding and with no questions at this time. Pt on supplemental O2 with VSS throughout. Will continue per POC at this time.   Follow Up Recommendations  No OT follow up;Supervision - Intermittent    Equipment Recommendations  None recommended by OT          Precautions / Restrictions Precautions Precautions: None Restrictions Weight Bearing Restrictions: No              ADL either performed or assessed with clinical judgement   ADL Overall ADL's : Needs assistance/impaired                                       General ADL Comments: focus of session on energy conservation and educating pt in compensatory strategies, pacing, and activity progression with functional tasks after return home. hand out issued and reviewed during session, pt verbalizing understanding throughout       Cognition Arousal/Alertness: Awake/alert Behavior During Therapy: Flat affect Overall Cognitive Status: Within Functional Limits for tasks assessed                                 General Comments: Grossly [facility]         Exercises     Shoulder  Instructions       General Comments VSS during session    Pertinent Vitals/ Pain       Pain Assessment: No/denies pain  Home Living                                          Prior Functioning/Environment              Frequency  Min 2X/week        Progress Toward Goals  OT Goals(current goals can now be found in the care plan section)  Progress towards OT goals: OT to reassess next treatment  Acute Rehab OT Goals Patient Stated Goal: to feel better OT Goal Formulation: With patient Time For Goal Achievement: 11/13/19 Potential to Achieve Goals: Good  Plan Discharge plan remains appropriate    Co-evaluation                 AM-PAC OT "6 Clicks" Daily Activity     Outcome Measure   Help from another person eating meals?: None Help from another person taking care of personal grooming?: A Little Help from another person toileting, which includes using toliet, bedpan, or urinal?: A Little Help from another person bathing (including washing, rinsing, drying)?: A Little Help from another person to put on and taking off regular  upper body clothing?: A Little Help from another person to put on and taking off regular lower body clothing?: A Little 6 Click Score: 19    End of Session Equipment Utilized During Treatment: Oxygen  OT Visit Diagnosis: Unsteadiness on feet (R26.81)   Activity Tolerance Patient limited by fatigue   Patient Left in bed;with call bell/phone within reach   Nurse Communication Mobility status        Time: 3235-5732 OT Time Calculation (min): 17 min  Charges: OT General Charges $OT Visit: 1 Visit OT Treatments $Self Care/Home Management : 8-22 mins  [name], OT Supplemental Rehabilitation Services Pager [phone] Office [phone]    [name] 11/02/2019, 4:49 PM

## 2019-11-02 NOTE — Progress Notes (Signed)
[name] to be D/C'd Home per MD order.  Discussed with the patient and all questions fully answered.  VSS, Skin clean, dry and intact without evidence of skin break down, no evidence of skin tears noted.  Right PICC line remains in place for administration of IV antibiotics while at home. IV team came and flushed the line with heparin before the patient was discharged.   An After Visit Summary was printed and given to the patient.   D/c education completed with patient/family including follow up instructions, medication list, d/c activities limitations if indicated, with other d/c instructions as indicated by MD - patient able to verbalize understanding, all questions fully answered.   Patient instructed to return to ED, call 911, or call MD for any changes in condition.   Patient escorted via WC, and D/C home via private auto.  Velva Harman 11/02/2019 2:29 PM

## 2019-11-02 NOTE — Progress Notes (Signed)
Physical Therapy Treatment Patient Details Name: Makayla Vasquez MRN: [id number] DOB: 03-Sep-1981 Today's Date: 11/02/2019    History of Present Illness MINDEL Vasquez is a 39 y.o. female presenting with staph aureus bactermia superimposed on COVID-19. PMH is significant for lupus, antisynthetase syndrome (anti-Jo-1 antibody, interstitial lung disease, myositis and elevated CK), acute on chronic respiratory failure/hypoxemia, pacemaker for congenital AV block, pulmonary hypertension, chronic dilated cardiomyopathy, T2DM, GERD, HLD.    PT Comments    Pt much more agreeable to therapy today. Pt continues to be limited in safe mobility by oxygen desaturation, and generalized weakness. Pt is currently independent with bed mobility and is mod I for transfers and ambulation of 20 feet without AD. Pt reports her sister will be in and out and will assist with iADLs while pt gets stronger. Educated pt on need for monitoring of SaO2 with finger pulse oximeter as her oxygen demand increases with mobility. Pt verbalized understanding. Given pt's current level of function PT recommends intermittent supervision from pt's sister and boyfriend for completion of ADLs at discharge. Pt hopeful for d/c home today    Follow Up Recommendations  No PT follow up;Supervision - Intermittent     Equipment Recommendations  None recommended by PT       Precautions / Restrictions Restrictions Weight Bearing Restrictions: No    Mobility  Bed Mobility Overal bed mobility: Independent             General bed mobility comments: with HOB elevated   Transfers Overall transfer level: Needs assistance Equipment used: None Transfers: Sit to/from Stand;Stand Pivot Transfers Sit to Stand: Modified independent (Device/Increase time)         General transfer comment: increased effort and use of rail in bathroom to stand from lower toilet seat.   Ambulation/Gait Ambulation/Gait assistance: Modified independent  (Device/Increase time) Gait Distance (Feet): 20 Feet Assistive device: None Gait Pattern/deviations: Step-through pattern;Decreased stride length;Drifts right/left Gait velocity: slowed Gait velocity interpretation: <1.8 ft/sec, indicate of risk for recurrent falls General Gait Details: Mod I for increased effort, 1x LoB which pt was able to self correct          Balance Overall balance assessment: Mild deficits observed, not formally tested                                          Cognition Arousal/Alertness: Awake/alert Behavior During Therapy: Flat affect Overall Cognitive Status: Within Functional Limits for tasks assessed                                 General Comments: Grossly [facility]       Exercises Other Exercises Other Exercises: sit>stand x 10, 5 with UE support on LE, 5 without UE support    General Comments General comments (skin integrity, edema, etc.): Pt on 2L O2 at rest with SaO2 of 96%O2 with ambulation to bathroom SaO2 dropped to 86%O2 and supplemental O2 increased to 3L via [place], with further ambulation SaO2 >88%O2. Pt states that she lost her home pulse oximeter, but states she can have her sister pick one up. Encouraged her to do that       Pertinent Vitals/Pain Pain Assessment: No/denies pain           PT Goals (current goals can now be found in the care plan section)  Acute Rehab PT Goals Patient Stated Goal: to feel better PT Goal Formulation: With patient Time For Goal Achievement: 11/13/19 Potential to Achieve Goals: Fair Progress towards PT goals: Progressing toward goals    Frequency    Min 2X/week      PT Plan Discharge plan needs to be updated       AM-PAC PT "6 Clicks" Mobility   Outcome Measure  Help needed turning from your back to your side while in a flat bed without using bedrails?: None Help needed moving from lying on your back to sitting on the side of a flat bed without using bedrails?:  None Help needed moving to and from a bed to a chair (including a wheelchair)?: A Little Help needed standing up from a chair using your arms (e.g., wheelchair or bedside chair)?: A Little Help needed to walk in hospital room?: A Little Help needed climbing 3-5 steps with a railing? : A Lot 6 Click Score: 19    End of Session Equipment Utilized During Treatment: Oxygen Activity Tolerance: Patient tolerated treatment well Patient left: with call bell/phone within reach;in bed Nurse Communication: Mobility status PT Visit Diagnosis: Unsteadiness on feet (R26.81);Muscle weakness (generalized) (M62.81)     Time: 1448-1856 PT Time Calculation (min) (ACUTE ONLY): 22 min  Charges:  $Gait Training: 8-22 mins                     Keyona Emrich B. Migdalia Dk PT, DPT Acute Rehabilitation Services Pager [phone] Office [phone]    [place] 11/02/2019, 10:23 AM

## 2019-11-02 NOTE — Care Management (Signed)
CM discussed the need for 24 hour supervision with pt late yesterday evening.  CM followed up with pt this am and pt informed CM that her sister can provide the 24 hour supervision and the administration of the IV antibiotics as ordered.  [employer] to follow up with CM regarding SOC date for [facility] provided by [facility]  CM left VM for sister to also review discharge plans

## 2019-11-03 ENCOUNTER — Encounter: Payer: Self-pay | Admitting: Family Medicine

## 2019-11-03 ENCOUNTER — Other Ambulatory Visit: Payer: Self-pay

## 2019-11-03 ENCOUNTER — Telehealth (INDEPENDENT_AMBULATORY_CARE_PROVIDER_SITE_OTHER): Payer: Medicare HMO | Admitting: Family Medicine

## 2019-11-03 VITALS — Temp 98.0°F | Ht 65.0 in | Wt 152.0 lb

## 2019-11-03 DIAGNOSIS — E1169 Type 2 diabetes mellitus with other specified complication: Secondary | ICD-10-CM

## 2019-11-03 DIAGNOSIS — B9561 Methicillin susceptible Staphylococcus aureus infection as the cause of diseases classified elsewhere: Secondary | ICD-10-CM | POA: Diagnosis not present

## 2019-11-03 DIAGNOSIS — E669 Obesity, unspecified: Secondary | ICD-10-CM

## 2019-11-03 DIAGNOSIS — U071 COVID-19: Secondary | ICD-10-CM | POA: Diagnosis not present

## 2019-11-03 DIAGNOSIS — Z794 Long term (current) use of insulin: Secondary | ICD-10-CM

## 2019-11-03 DIAGNOSIS — R7881 Bacteremia: Secondary | ICD-10-CM | POA: Diagnosis not present

## 2019-11-03 NOTE — Patient Instructions (Addendum)
Thank you for allowing [place] to take part in your care.   Today we followed up with you after discharging from the hospital.   I would like for you to start taking your insulin again. Instead of 15 units of Lantus, start taking 7 units at night. We will hold off on restarting your meal time insulin for now.   Please let us know if your blood sugars start to become elevated around greater than 150 or more for your fasting blood sugars (before breakfast).   I recommend that you check your blood sugar before breakfast for your fasting and before each meal.   I will schedule a follow up virtual visit for you in 1 week to check.

## 2019-11-03 NOTE — Progress Notes (Signed)
[facility] Telemedicine Visit  Patient consented to have virtual visit. Method of visit: Video  Encounter participants: Patient: Makayla Vasquez - located at patient's home address  Provider: Nicki Guadalajara - located at [facility]  Others (if applicable): none  Chief Complaint: hospital follow up for MSSA bacteremia and COVID +   HPI:  COVID + 10/23/19 Patient was recently discharged from the hospital and feels improved from when she was admitted. During hospitalization, she was treated with Decadron for 10 days and 5 days of remdesivir. Patient continued on home oxygen at time of discharge and has continued to use 3 Liters via [place]. Patient states that she still coughing, dry cough  most of the time. Patient states she would like refill of robintussin DM, counseled on cough and likelihood of lasting a long time after acute illness. Denies chest pain or chest tightness or denies fevers.  MSSA Bacteremia Patient confirms that her sister has been helping with administration of IV abx with assistance of home health nurse. Patient is being treated with Cefazolin and Rifampin until 12/11/19 as recommended by ID for staph aureus in 1/4 blood cultures during this admission. As stated above, patient feels well and has no fever or chills since discharge. Appears to be tolerating well.   DM  Has not checked CBG since coming home from the hosptial. Patient normally takes 15 units of Lantus at night and 8 units of Novolog with meals Patient reports having decreased appetite, has not been taking in as much as usual PO BG measured during call: 167 Denies HA, blurry vision   ROS: per HPI  Pertinent PMHx:   Exam:  General: female appearing younger than stated age in NAD sitting up on couch  Respiratory: speaking in clear sentences, on home oxygen [place], 3 Liters, no signs of increased WOB   Assessment/Plan:  Diabetes mellitus type 2 in obese (HCC) Patient's home insulin  regimen 8 units of Novolog with meals and 15 units of lantus at night.  Patient's appetite decreased since diagnosed and has not returned to normal. BG today 167 -restart insulin regimen at 7 units at night  -will hold meal coverage insulin  -patient counseled to call if BG measures greater than 200  -scheduled for follow up 11/11/19     Time spent during visit with patient: 20 minutes

## 2019-11-07 NOTE — Assessment & Plan Note (Addendum)
Patient's home insulin regimen 8 units of Novolog with meals and 15 units of lantus at night.  Patient's appetite decreased since diagnosed and has not returned to normal. BG today 167 -restart insulin regimen at 7 units at night  -will hold meal coverage insulin  -patient counseled to call if BG measures greater than 200  -scheduled for follow up 11/11/19

## 2019-11-10 ENCOUNTER — Telehealth: Payer: Medicare HMO

## 2019-11-10 ENCOUNTER — Other Ambulatory Visit: Payer: Self-pay

## 2019-11-10 ENCOUNTER — Emergency Department (HOSPITAL_COMMUNITY): Payer: Medicare HMO

## 2019-11-10 ENCOUNTER — Observation Stay (HOSPITAL_COMMUNITY): Payer: Medicare HMO

## 2019-11-10 ENCOUNTER — Inpatient Hospital Stay (HOSPITAL_COMMUNITY)
Admission: EM | Admit: 2019-11-10 | Discharge: 2019-11-13 | DRG: 871 | Disposition: A | Discharge status: Home Health | Payer: Medicare HMO | Attending: Family Medicine | Admitting: Family Medicine

## 2019-11-10 DIAGNOSIS — Z79899 Other long term (current) drug therapy: Secondary | ICD-10-CM | POA: Diagnosis not present

## 2019-11-10 DIAGNOSIS — D638 Anemia in other chronic diseases classified elsewhere: Secondary | ICD-10-CM | POA: Diagnosis present

## 2019-11-10 DIAGNOSIS — Z87891 Personal history of nicotine dependence: Secondary | ICD-10-CM | POA: Diagnosis not present

## 2019-11-10 DIAGNOSIS — Z950 Presence of cardiac pacemaker: Secondary | ICD-10-CM | POA: Diagnosis not present

## 2019-11-10 DIAGNOSIS — Z832 Family history of diseases of the blood and blood-forming organs and certain disorders involving the immune mechanism: Secondary | ICD-10-CM

## 2019-11-10 DIAGNOSIS — I5032 Chronic diastolic (congestive) heart failure: Secondary | ICD-10-CM | POA: Diagnosis present

## 2019-11-10 DIAGNOSIS — R944 Abnormal results of kidney function studies: Secondary | ICD-10-CM | POA: Diagnosis present

## 2019-11-10 DIAGNOSIS — E119 Type 2 diabetes mellitus without complications: Secondary | ICD-10-CM | POA: Diagnosis present

## 2019-11-10 DIAGNOSIS — G4700 Insomnia, unspecified: Secondary | ICD-10-CM | POA: Diagnosis present

## 2019-11-10 DIAGNOSIS — R7402 Elevation of levels of lactic acid dehydrogenase (LDH): Secondary | ICD-10-CM | POA: Diagnosis present

## 2019-11-10 DIAGNOSIS — I110 Hypertensive heart disease with heart failure: Secondary | ICD-10-CM | POA: Diagnosis present

## 2019-11-10 DIAGNOSIS — K219 Gastro-esophageal reflux disease without esophagitis: Secondary | ICD-10-CM | POA: Diagnosis present

## 2019-11-10 DIAGNOSIS — R7881 Bacteremia: Secondary | ICD-10-CM | POA: Diagnosis present

## 2019-11-10 DIAGNOSIS — E876 Hypokalemia: Secondary | ICD-10-CM | POA: Diagnosis present

## 2019-11-10 DIAGNOSIS — J449 Chronic obstructive pulmonary disease, unspecified: Secondary | ICD-10-CM | POA: Diagnosis present

## 2019-11-10 DIAGNOSIS — Z7952 Long term (current) use of systemic steroids: Secondary | ICD-10-CM | POA: Diagnosis not present

## 2019-11-10 DIAGNOSIS — J8410 Pulmonary fibrosis, unspecified: Secondary | ICD-10-CM | POA: Diagnosis present

## 2019-11-10 DIAGNOSIS — J9611 Chronic respiratory failure with hypoxia: Secondary | ICD-10-CM | POA: Diagnosis present

## 2019-11-10 DIAGNOSIS — Z888 Allergy status to other drugs, medicaments and biological substances status: Secondary | ICD-10-CM | POA: Diagnosis not present

## 2019-11-10 DIAGNOSIS — E785 Hyperlipidemia, unspecified: Secondary | ICD-10-CM | POA: Diagnosis present

## 2019-11-10 DIAGNOSIS — B9561 Methicillin susceptible Staphylococcus aureus infection as the cause of diseases classified elsewhere: Secondary | ICD-10-CM | POA: Diagnosis present

## 2019-11-10 DIAGNOSIS — Z794 Long term (current) use of insulin: Secondary | ICD-10-CM

## 2019-11-10 DIAGNOSIS — J988 Other specified respiratory disorders: Secondary | ICD-10-CM

## 2019-11-10 DIAGNOSIS — R509 Fever, unspecified: Secondary | ICD-10-CM | POA: Diagnosis not present

## 2019-11-10 DIAGNOSIS — R531 Weakness: Secondary | ICD-10-CM

## 2019-11-10 DIAGNOSIS — I509 Heart failure, unspecified: Secondary | ICD-10-CM | POA: Diagnosis not present

## 2019-11-10 DIAGNOSIS — Z8249 Family history of ischemic heart disease and other diseases of the circulatory system: Secondary | ICD-10-CM

## 2019-11-10 DIAGNOSIS — Z91048 Other nonmedicinal substance allergy status: Secondary | ICD-10-CM | POA: Diagnosis not present

## 2019-11-10 DIAGNOSIS — Q246 Congenital heart block: Secondary | ICD-10-CM

## 2019-11-10 DIAGNOSIS — U071 COVID-19: Secondary | ICD-10-CM | POA: Diagnosis present

## 2019-11-10 DIAGNOSIS — R5383 Other fatigue: Secondary | ICD-10-CM | POA: Diagnosis not present

## 2019-11-10 DIAGNOSIS — I959 Hypotension, unspecified: Secondary | ICD-10-CM | POA: Diagnosis present

## 2019-11-10 DIAGNOSIS — Z9981 Dependence on supplemental oxygen: Secondary | ICD-10-CM

## 2019-11-10 DIAGNOSIS — M329 Systemic lupus erythematosus, unspecified: Secondary | ICD-10-CM | POA: Diagnosis present

## 2019-11-10 DIAGNOSIS — Z8616 Personal history of COVID-19: Secondary | ICD-10-CM | POA: Diagnosis not present

## 2019-11-10 DIAGNOSIS — Z8619 Personal history of other infectious and parasitic diseases: Secondary | ICD-10-CM | POA: Diagnosis not present

## 2019-11-10 DIAGNOSIS — K5900 Constipation, unspecified: Secondary | ICD-10-CM | POA: Diagnosis present

## 2019-11-10 LAB — COMPREHENSIVE METABOLIC PANEL
ALT: 6 U/L (ref 0–44)
AST: 34 U/L (ref 15–41)
Albumin: 2.6 g/dL — ABNORMAL LOW (ref 3.5–5.0)
Alkaline Phosphatase: 64 U/L (ref 38–126)
Anion gap: 12 (ref 5–15)
BUN: 14 mg/dL (ref 6–20)
CO2: 29 mmol/L (ref 22–32)
Calcium: 8 mg/dL — ABNORMAL LOW (ref 8.9–10.3)
Chloride: 93 mmol/L — ABNORMAL LOW (ref 98–111)
Creatinine, Ser: 1.03 mg/dL — ABNORMAL HIGH (ref 0.44–1.00)
GFR calc Af Amer: >60 mL/min (ref >60)
GFR calc non Af Amer: >60 mL/min (ref >60)
Glucose, Bld: 103 mg/dL — ABNORMAL HIGH (ref 70–99)
Potassium: 3.6 mmol/L (ref 3.5–5.1)
Sodium: 134 mmol/L — ABNORMAL LOW (ref 135–145)
Total Bilirubin: 0.7 mg/dL (ref 0.3–1.2)
Total Protein: 6.8 g/dL (ref 6.5–8.1)

## 2019-11-10 LAB — LACTIC ACID, PLASMA
Lactic Acid, Venous: 2 mmol/L (ref 0.5–1.9)
Lactic Acid, Venous: 2 mmol/L (ref 0.5–1.9)

## 2019-11-10 LAB — CBC WITH DIFFERENTIAL/PLATELET
Abs Immature Granulocytes: 0.03 10*3/uL (ref 0.00–0.07)
Basophils Absolute: 0 10*3/uL (ref 0.0–0.1)
Basophils Relative: 1 %
Eosinophils Absolute: 0.2 10*3/uL (ref 0.0–0.5)
Eosinophils Relative: 3 %
HCT: 33.2 % — ABNORMAL LOW (ref 36.0–46.0)
Hemoglobin: 9.1 g/dL — ABNORMAL LOW (ref 12.0–15.0)
Immature Granulocytes: 1 %
Lymphocytes Relative: 22 %
Lymphs Abs: 1.4 10*3/uL (ref 0.7–4.0)
MCH: 21.8 pg — ABNORMAL LOW (ref 26.0–34.0)
MCHC: 27.4 g/dL — ABNORMAL LOW (ref 30.0–36.0)
MCV: 79.6 fL — ABNORMAL LOW (ref 80.0–100.0)
Monocytes Absolute: 0.7 10*3/uL (ref 0.1–1.0)
Monocytes Relative: 11 %
Neutro Abs: 4 10*3/uL (ref 1.7–7.7)
Neutrophils Relative %: 62 %
Platelets: 298 10*3/uL (ref 150–400)
RBC: 4.17 MIL/uL (ref 3.87–5.11)
RDW: 19.7 % — ABNORMAL HIGH (ref 11.5–15.5)
WBC: 6.4 10*3/uL (ref 4.0–10.5)
nRBC: 0 % (ref 0.0–0.2)

## 2019-11-10 LAB — D-DIMER, QUANTITATIVE: D-Dimer, Quant: 4.37 ug/mL-FEU — ABNORMAL HIGH (ref 0.00–0.50)

## 2019-11-10 LAB — LIPASE, BLOOD: Lipase: 19 U/L (ref 11–51)

## 2019-11-10 LAB — I-STAT BETA HCG BLOOD, ED (MC, WL, AP ONLY): I-stat hCG, quantitative: <5 m[IU]/mL (ref <5)

## 2019-11-10 LAB — FIBRINOGEN: Fibrinogen: 599 mg/dL — ABNORMAL HIGH (ref 210–475)

## 2019-11-10 LAB — PROCALCITONIN: Procalcitonin: 0.16 ng/mL

## 2019-11-10 LAB — C-REACTIVE PROTEIN: CRP: 7.1 mg/dL — ABNORMAL HIGH (ref <1.0)

## 2019-11-10 LAB — LACTATE DEHYDROGENASE: LDH: 471 U/L — ABNORMAL HIGH (ref 98–192)

## 2019-11-10 LAB — SARS CORONAVIRUS 2 (TAT 6-24 HRS): SARS Coronavirus 2: POSITIVE — AB

## 2019-11-10 LAB — FERRITIN: Ferritin: 149 ng/mL (ref 11–307)

## 2019-11-10 LAB — BRAIN NATRIURETIC PEPTIDE: B Natriuretic Peptide: 71.1 pg/mL (ref 0.0–100.0)

## 2019-11-10 LAB — TROPONIN I (HIGH SENSITIVITY): Troponin I (High Sensitivity): 32 ng/L — ABNORMAL HIGH (ref <18)

## 2019-11-10 LAB — TRIGLYCERIDES: Triglycerides: 260 mg/dL — ABNORMAL HIGH (ref <150)

## 2019-11-10 MED ORDER — ALBUMIN HUMAN 5 % IV SOLN
12.5000 g | Freq: Once | INTRAVENOUS | Status: AC
  Administered 2019-11-11: 02:00:00 12.5 g via INTRAVENOUS
  Filled 2019-11-10: qty 250

## 2019-11-10 MED ORDER — ALBUTEROL SULFATE HFA 108 (90 BASE) MCG/ACT IN AERS: 2.0000 | INHALATION_SPRAY | RESPIRATORY_TRACT | Status: DC | PRN

## 2019-11-10 MED ORDER — BISOPROLOL FUMARATE 5 MG PO TABS: 5.0000 mg | ORAL_TABLET | Freq: Every day | ORAL | Status: DC

## 2019-11-10 MED ORDER — SODIUM CHLORIDE 0.9 % IV BOLUS
500.0000 mL | Freq: Once | INTRAVENOUS | Status: AC
  Administered 2019-11-10: 23:00:00 500 mL via INTRAVENOUS

## 2019-11-10 MED ORDER — POLYETHYLENE GLYCOL 3350 17 G PO PACK
17.0000 g | PACK | Freq: Two times a day (BID) | ORAL | Status: DC
  Administered 2019-11-11 – 2019-11-12 (×2): 17 g via ORAL
  Filled 2019-11-10 (×4): qty 1

## 2019-11-10 MED ORDER — IOHEXOL 350 MG/ML SOLN
100.0000 mL | Freq: Once | INTRAVENOUS | Status: AC | PRN
  Administered 2019-11-10: 20:00:00 100 mL via INTRAVENOUS

## 2019-11-10 MED ORDER — DEXTROMETHORPHAN-GUAIFENESIN 10-100 MG/5ML PO LIQD
5.0000 mL | ORAL | Status: DC | PRN
  Filled 2019-11-10 (×2): qty 5

## 2019-11-10 MED ORDER — SODIUM CHLORIDE 0.9 % IV SOLN
1000.0000 mL | INTRAVENOUS | Status: DC
  Administered 2019-11-10 – 2019-11-11 (×2): 1000 mL via INTRAVENOUS

## 2019-11-10 MED ORDER — SODIUM CHLORIDE 0.9 % IV BOLUS
500.0000 mL | Freq: Once | INTRAVENOUS | Status: AC
  Administered 2019-11-10: 22:00:00 500 mL via INTRAVENOUS

## 2019-11-10 MED ORDER — LACTATED RINGERS IV BOLUS
500.0000 mL | Freq: Once | INTRAVENOUS | Status: AC
  Administered 2019-11-11: 500 mL via INTRAVENOUS

## 2019-11-10 MED ORDER — LACTATED RINGERS IV BOLUS
500.0000 mL | Freq: Once | INTRAVENOUS | Status: AC
  Administered 2019-11-10: 19:00:00 500 mL via INTRAVENOUS

## 2019-11-10 MED ORDER — SODIUM CHLORIDE 0.9 % IV SOLN
2.0000 g | Freq: Once | INTRAVENOUS | Status: AC
  Administered 2019-11-10: 18:00:00 2 g via INTRAVENOUS
  Filled 2019-11-10: qty 2

## 2019-11-10 MED ORDER — ACETAMINOPHEN 325 MG PO TABS
650.0000 mg | ORAL_TABLET | Freq: Once | ORAL | Status: AC
  Administered 2019-11-10: 16:00:00 650 mg via ORAL
  Filled 2019-11-10: qty 2

## 2019-11-10 MED ORDER — LACTATED RINGERS IV BOLUS
1000.0000 mL | Freq: Once | INTRAVENOUS | Status: AC
  Administered 2019-11-10: 16:00:00 1000 mL via INTRAVENOUS

## 2019-11-10 MED ORDER — BENZONATATE 100 MG PO CAPS
100.0000 mg | ORAL_CAPSULE | Freq: Three times a day (TID) | ORAL | Status: DC | PRN
  Administered 2019-11-12: 10:00:00 100 mg via ORAL
  Filled 2019-11-10: qty 1

## 2019-11-10 MED ORDER — TORSEMIDE 20 MG PO TABS: 20.0000 mg | ORAL_TABLET | Freq: Every day | ORAL | Status: DC

## 2019-11-10 MED ORDER — PREDNISONE 10 MG PO TABS
10.0000 mg | ORAL_TABLET | Freq: Every day | ORAL | Status: DC
  Administered 2019-11-11: 09:00:00 10 mg via ORAL

## 2019-11-10 MED ORDER — ENOXAPARIN SODIUM 40 MG/0.4ML ~~LOC~~ SOLN
40.0000 mg | SUBCUTANEOUS | Status: DC
  Administered 2019-11-11 – 2019-11-13 (×3): 40 mg via SUBCUTANEOUS
  Filled 2019-11-10 (×3): qty 0.4

## 2019-11-10 MED ORDER — SENNA 8.6 MG PO TABS
1.0000 | ORAL_TABLET | Freq: Every day | ORAL | Status: DC
  Administered 2019-11-10 – 2019-11-12 (×2): 8.6 mg via ORAL
  Filled 2019-11-10 (×2): qty 1

## 2019-11-10 MED ORDER — LORATADINE 10 MG PO TABS
10.0000 mg | ORAL_TABLET | Freq: Every day | ORAL | Status: DC
  Administered 2019-11-11 – 2019-11-13 (×3): 10 mg via ORAL
  Filled 2019-11-10 (×3): qty 1

## 2019-11-10 MED ORDER — RIFAMPIN 300 MG PO CAPS
600.0000 mg | ORAL_CAPSULE | Freq: Every day | ORAL | Status: DC
  Administered 2019-11-11 – 2019-11-13 (×3): 600 mg via ORAL
  Filled 2019-11-10 (×3): qty 2

## 2019-11-10 MED ORDER — SODIUM CHLORIDE 0.9 % IV SOLN
2.0000 g | Freq: Three times a day (TID) | INTRAVENOUS | Status: DC
  Administered 2019-11-11 – 2019-11-13 (×8): 2 g via INTRAVENOUS
  Filled 2019-11-10 (×8): qty 2

## 2019-11-10 MED ORDER — ONDANSETRON HCL 4 MG/2ML IJ SOLN
4.0000 mg | Freq: Once | INTRAMUSCULAR | Status: AC
  Administered 2019-11-10: 16:00:00 4 mg via INTRAVENOUS
  Filled 2019-11-10: qty 2

## 2019-11-10 MED ORDER — VANCOMYCIN HCL 1250 MG/250ML IV SOLN
1250.0000 mg | Freq: Once | INTRAVENOUS | Status: AC
  Administered 2019-11-10: 19:00:00 1250 mg via INTRAVENOUS
  Filled 2019-11-10: qty 250

## 2019-11-10 MED ORDER — PANTOPRAZOLE SODIUM 40 MG PO TBEC
40.0000 mg | DELAYED_RELEASE_TABLET | Freq: Every day | ORAL | Status: DC
  Administered 2019-11-11 – 2019-11-13 (×3): 40 mg via ORAL
  Filled 2019-11-10 (×3): qty 1

## 2019-11-10 NOTE — Progress Notes (Signed)
FPTS Interim Progress Note  Received page that patient was hypotensive.  Blood pressure was 85/58.  Map 68.  On presentation to the room patient was able to remain conversant.  Oriented x3.  States she is feeling somewhat dizzy.  Is status post 1.5 L fluid bolus and receiving maintenance fluids at this time.  Blood pressure remains soft.  Bisoprolol and torsemide on MAR, however does not start till tomorrow.  We will plan to discontinue these medications and day team can restart as needed.  Patient was transferred to progressive as well.  Consulted CCM given no improvement in blood pressure despite fluids.  Spoke to Dr. Warrick Parisian, he went physician on call.  Dr. Warrick Parisian recommended 500 cc bolus as well as 250 cc 5% albumin.  Advised to call back if blood pressure does not improve with fluid bolus as well as albumin.  Appreciate CCM recommendations.  O: BP (!) 87/66   Pulse (!) 108   Temp 98.3 F (36.8 C) (Oral)   Resp (!) 33   SpO2 91%   Gen: awake and alert, oriented x3 Cardio: RRR, no MRG Resp: CTAB Ext: no edema  A/P: Hypotension -500cc bolus -250cc 5%albumin -CCM consulted, will call back if no improvement in BP -vitals q2h -dc bisoprolol and torsemide, add back per day team recommendations   Makayla Manis, DO 11/10/2019, 11:18 PM PGY-3, [facility] Family Medicine Service pager [phone]

## 2019-11-10 NOTE — ED Notes (Signed)
Transported to CT 

## 2019-11-10 NOTE — ED Provider Notes (Signed)
[facility] EMERGENCY DEPARTMENT Provider Note   CSN: [id number] Arrival date & time: 11/10/19  1354     History Chief Complaint  Patient presents with  . Emesis  . Fatigue    Makayla Vasquez is a 40 y.o. female.  The history is provided by the patient and medical records. No language interpreter was used.  Emesis    39 year old female with history of asthma, bipolar, CHF, diabetes, interstitial lung disease, lupus, seizures, recently admitted to the hospital from 10/23/2019 and discharged on 11/02/2019 for CHF exacerbation in the setting of COVID-19 infection as well as bacteremia due to methicillin susceptible Staphylococcus presenting today with complaints of generalized malaise.  Patient reports since she was discharged from the hospital over a week ago, she was doing a bit better however for the past 3 days she is feeling worse.  She endorsed generalized malaise, decrease in appetite, feeling nauseous, vomiting, recurrent cough, generalized weakness and overall not feeling well.  She is on home oxygen between 2 L to 4 L and denies having increase shortness of breath.  She does endorse fever and chills and body aches.  No loss of taste or smell no dysuria.  She is currently receiving antibiotic for bacteremia.  Overall patient does not feel well.  Past Medical History:  Diagnosis Date  . Anxiety   . Arthritis    rheumatoid arthritis- mild, no rheumatology care   . Asthma    seasonal allergies   . Asymptomatic LV dysfunction    a. Echo in Dec 2011 with EF 35 to 40%. Felt to be due to paced rhythm. b. EF 25-30% in 07/2014.  . Bipolar affective disorder ([place])   . Cardiac pacemaker    a. Since age 68 in 60. b. Upgrade to BiV in 2013.  [name] Carpal tunnel syndrome of right wrist   . CHF (congestive heart failure) ([place])   . Congenital complete AV block   . Cor pulmonale, chronic ([place]) 04/08/2018  . Depression    bipolar  . Diabetes mellitus without complication ([place])    . GERD (gastroesophageal reflux disease)   . Hypertension   . Interstitial lung disease ([place])   . Lupus ([place])   . Lupus (systemic lupus erythematosus) ([place])   . Obesity   . Pneumonitis    a. a/w hypoxia - inflammatory - large workup 07/2014.  . Presence of permanent cardiac pacemaker   . Seizures ([place])    as a child- from high fever  . Sinus tachycardia     Patient Active Problem List   Diagnosis Date Noted  . Paroxysmal tachycardia ([place]) 10/31/2019  . Bacteremia due to methicillin susceptible Staphylococcus aureus (MSSA) 10/26/2019  . COVID-19 10/24/2019  . Chronic obstructive pulmonary disease ([place])   . Increased sputum production 10/17/2019  . Acute bronchitis 06/07/2019  . Screen for STD (sexually transmitted disease) 05/15/2019  . Moderate persistent asthma with acute exacerbation 04/19/2019  . Chronic nasal congestion 10/29/2018  . CAP (community acquired pneumonia) 10/24/2018  . Immunosuppression ([place]) 10/24/2018  . 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 Diabetes mellitus due to therapeutic use of corticosteroid ([place]) 10/24/2018  . Right wrist pain 07/24/2018  . Anxiety state 07/21/2018  . Upper airway cough syndrome 06/18/2018  . Cor pulmonale, chronic ([place]) 04/08/2018  . Abnormal uterine bleeding 02/14/2018  . Dyspareunia, female 05/14/2017  . Sepsis without acute organ dysfunction ([place]) 01/05/2017  . Emesis 01/04/2017  . 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  On Cellcept therapy   . Lupus ([place])   . Fever   . Long term (current) use of systemic steroids   . Chronic diastolic congestive heart failure ([place])   . Cough   . Interstitial lung disease ([place])   . Anemia   . Bipolar affective disorder in remission ([place])   . Acute on chronic congestive heart failure ([place])   . 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 Adrenal insufficiency ([place])   . SOB (shortness of breath)   . 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 Idiopathic hypotension 07/08/2015  . Hyperlipidemia associated with type 2 diabetes mellitus ([place]) 05/04/2015  . Diabetes mellitus type 2 in obese ([place])   . Chronic respiratory failure with hypoxia ([place]) 08/07/2014  . Acute respiratory failure with hypoxia ([place]) 06/19/2014  . Congestive dilated cardiomyopathy ([place]) 05/31/2014  . Acute on chronic HFrEF (heart failure with reduced ejection fraction) ([place]) 02/26/2012  . PPM-Medtronic   . Congenital complete AV block   . GERD (gastroesophageal reflux disease)   . 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 Polycystic ovaries 12/17/2006    Past Surgical History:  Procedure Laterality Date  . ATRIAL TACH ABLATION N/A 08/14/2014   Procedure: ATRIAL TACH ABLATION;  Surgeon: [name], MD;  Location: [facility] CATH LAB;  Service: Cardiovascular;  Laterality: N/A;  . BI-VENTRICULAR PACEMAKER UPGRADE N/A 03/08/2012   Procedure: BI-VENTRICULAR PACEMAKER UPGRADE;  Surgeon: [name], MD;  Location[facility] CATH LAB;  Service: Cardiovascular;  Laterality: N/A;  . CARPAL TUNNEL WITH CUBITAL TUNNEL Right 07/26/2013   Procedure: RIGHT LIMITED OPEN CARPAL TUNNEL RELEASE ,  RIGHT CUBITAL TUNNEL RELEASE, INSITU VERSES ULNAR NERVE DECOMPRESSION AND ANTERIOR TRANSPOSITION;  Surgeon: [name], MD;  Location: [facility];  Service: Orthopedics;  Laterality: Right;  . CESAREAN SECTION    . CHOLECYSTECTOMY    . CYST EXCISION  12/10/2012   THYROID  . INSERT / REPLACE / REMOVE PACEMAKER     2001  . IUD REMOVAL  11/03/2011   Procedure: INTRAUTERINE DEVICE (IUD) REMOVAL;  Surgeon: [name], MD;  Location: [facility];  Service: Gynecology;  Laterality: N/A;  . NASAL FRACTURE SURGERY     /w plate   . RIGHT HEART CATHETERIZATION N/A 10/26/2014   Procedure: RIGHT HEART CATH;  Surgeon: [name], MD;  Location: [facility] CATH LAB;  Service: Cardiovascular;  Laterality: N/A;  . TEE WITHOUT CARDIOVERSION N/A 10/31/2019   Procedure: TRANSESOPHAGEAL ECHOCARDIOGRAM (TEE);   Surgeon: [name], MD;  Location: [facility] ENDOSCOPY;  Service: Cardiovascular;  Laterality: N/A;  . THROAT SURGERY  1994   s/p laser treatment  . THYROGLOSSAL DUCT CYST N/A 12/10/2012   Procedure: REVISION OF THYROGLOSSAL DUCT CYST EXCISION;  Surgeon: [name], MD;  Location: [facility];  Service: ENT;  Laterality: N/A;  Revision of Thyroglossal Duct Cyst Excision  . VIDEO BRONCHOSCOPY Bilateral 06/19/2014   Procedure: VIDEO BRONCHOSCOPY WITHOUT FLUORO;  Surgeon: Brand Males, MD;  Location: [facility];  Service: Cardiopulmonary;  Laterality: Bilateral;     OB History    Gravida  1   Para  1   Term  1   Preterm      AB      Living  1     SAB      TAB      Ectopic      Multiple      Live Births  Family History  Problem Relation Age of Onset  . Heart disease Mother        CHF (no details)  . Hypertension Mother   . Lupus Mother   . Heart disease Father        Murmur  . Heart disease Sister 55        No details.  History of a pacemaker    Social History   Tobacco Use  . Smoking status: Former Smoker    Packs/day: 0.25    Years: 0.50    Pack years: 0.12    Types: Cigarettes    Quit date: 07/25/1996    Years since quitting: 23.3  . Smokeless tobacco: Never Used  Substance Use Topics  . Alcohol use: No  . Drug use: No    Home Medications Prior to Admission medications   Medication Sig Start Date End Date Taking? Authorizing Provider  albuterol (VENTOLIN HFA) 108 (90 Base) MCG/ACT inhaler Inhale 2 puffs into the lungs every 4 (four) hours as needed for wheezing or shortness of breath. 02/25/19   Caroline More, DO  Alcohol Swabs (B-D SINGLE USE SWABS REGULAR) PADS 1 application by Does not apply route 4 (four) times daily. 03/15/19   [place] Bing, DO  benzonatate (TESSALON PERLES) 100 MG capsule Take 1 capsule (100 mg total) by mouth 3 (three) times daily as needed for cough. Patient not taking: Reported on 10/24/2019 10/17/19    Stark Klein, MD  bisoprolol (ZEBETA) 5 MG tablet Take 1 tablet (5 mg total) by mouth daily. 08/09/19   Clegg, Amy D, NP  Blood Glucose Calibration (ACCU-CHEK AVIVA) SOLN 1 application by In Vitro route 4 (four) times daily. 03/18/19   [place] Bing, DO  blood glucose meter kit and supplies Dispense based on patient and insurance preference. Use up to four times daily as directed. (FOR ICD-10 E10.9, E11.9). 03/18/19   [place] Bing, DO  ceFAZolin (ANCEF) IVPB Inject 2 g into the vein every 8 (eight) hours for 21 days. Indication:  MSSA bacteremia Last Day of Therapy:  11/22/19 Labs - Once weekly:  CBC/D and BMP, Labs - Every other week:  ESR and CRP 11/01/19 11/22/19  Kinnie Feil, MD  cromolyn (NASALCROM) 5.2 MG/ACT nasal spray Place 1 spray into both nostrils 4 (four) times daily. Patient not taking: Reported on 10/24/2019 08/15/19   Baird Lyons D, MD  dextromethorphan-guaiFENesin (ROBITUSSIN-DM) 10-100 MG/5ML liquid TAKE 5 MLS BY MOUTH EVERY 4 (FOUR) HOURS AS NEEDED FOR COUGH. Patient taking differently: Take 5 mLs by mouth every 4 (four) hours as needed for cough.  10/27/18   [place] Bing, DO  Etonogestrel (NEXPLANON [place]) Inject 1 Device into the skin once. Every 3 years    [provider]  ferrous sulfate 325 (65 FE) MG EC tablet Take 1 tablet (325 mg total) by mouth daily with breakfast. 08/11/19 02/07/20  Lockamy, Timothy, DO  fluticasone furoate-vilanterol (BREO ELLIPTA) 100-25 MCG/INH AEPB Inhale 1 puff into the lungs daily. Patient not taking: Reported on 10/24/2019 06/07/19   Deneise Lever, MD  LANTUS SOLOSTAR 100 UNIT/ML Solostar Pen INJECT [place] DAY Patient taking differently: Inject 15 Units into the skin daily.  01/05/19   [place] Bing, DO  loratadine (CLARITIN) 10 MG tablet Take 1 tablet (10 mg total) by mouth daily. 08/15/19   Baird Lyons D, MD  metFORMIN (GLUCOPHAGE) 1000 MG tablet TAKE 1 TABLET BY MOUTH TWICE DAILY WITH A  MEAL Patient taking  differently: Take 1,000 mg by mouth 2 (two) times daily with a meal.  03/22/19   [place] Bing, DO  omeprazole (PRILOSEC) 20 MG capsule TAKE 1 CAPSULE BY MOUTH EVERY DAY Patient taking differently: Take 20 mg by mouth daily.  05/02/19   Baird Lyons D, MD  OXYGEN Inhale 2-4 L into the lungs continuous.     [provider]  potassium chloride SA (K-DUR,KLOR-CON) 20 MEQ tablet Take 1 tablet (20 mEq total) by mouth daily. 02/01/19   Bensimhon, Shaune Pascal, MD  predniSONE (DELTASONE) 10 MG tablet Take 1 tablet (10 mg total) by mouth daily with breakfast. 11/03/19   Lyndee Hensen, MD  rifampin (RIFADIN) 300 MG capsule Take 2 capsules (600 mg total) by mouth daily for 20 days. 11/02/19 11/22/19  Lyndee Hensen, MD  torsemide (DEMADEX) 20 MG tablet Take 1 tablet (20 mg total) by mouth daily. 11/02/19 12/02/19  Lyndee Hensen, MD  traZODone (DESYREL) 50 MG tablet TAKE 1 TABLET (50 MG TOTAL) BY MOUTH AT BEDTIME AS NEEDED FOR SLEEP. 09/27/18   [place] Bing, DO    Allergies    Atorvastatin, Sertraline hcl, and Tape  Review of Systems   Review of Systems  Gastrointestinal: Positive for vomiting.  All other systems reviewed and are negative.   Physical Exam Updated Vital Signs BP 94/62 (BP Location: Right Arm)   Pulse 72   Temp (!) 100.4 F (38 C) (Oral)   Resp 20   SpO2 98%   Physical Exam Vitals and nursing note reviewed.  Constitutional:      General: She is not in acute distress.    Appearance: She is well-developed.     Comments: Chronically ill-appearing female laying in a left lateral decubitus position appears mildly uncomfortable.  HENT:     Head: Atraumatic.     Comments: Wearing supplemental oxygen. Eyes:     Conjunctiva/sclera: Conjunctivae normal.  Cardiovascular:     Rate and Rhythm: Normal rate and regular rhythm.     Heart sounds: Murmur present.  Pulmonary:     Effort: Pulmonary effort is normal.     Breath sounds: Normal breath sounds.   Abdominal:     Palpations: Abdomen is soft.     Tenderness: There is no abdominal tenderness.  Musculoskeletal:     Cervical back: Neck supple.     Comments: 5 out of 5 strength to all 4 extremities.  Skin:    General: Skin is warm.     Findings: No rash.  Neurological:     Mental Status: She is alert and oriented to person, place, and time.  Psychiatric:        Mood and Affect: Mood normal.     ED Results / Procedures / Treatments   Labs (all labs ordered are listed, but only abnormal results are displayed) Labs Reviewed - No data to display  EKG None  Radiology No results found.  Procedures .Critical Care Performed by: Domenic Moras, PA-C Authorized by: Domenic Moras, PA-C   Critical care provider statement:    Critical care time (minutes):  45   Critical care was time spent personally by me on the following activities:  Discussions with consultants, evaluation of patient's response to treatment, examination of patient, ordering and performing treatments and interventions, ordering and review of laboratory studies, ordering and review of radiographic studies, pulse oximetry, re-evaluation of patient's condition, obtaining history from patient or surrogate and review of old charts   (including critical care time)  Medications Ordered  in ED Medications - No data to display  ED Course  I have reviewed the triage vital signs and the nursing notes.  Pertinent labs & imaging results that were available during my care of the patient were reviewed by me and considered in my medical decision making (see chart for details).    MDM Rules/Calculators/A&P                      BP 94/62 (BP Location: Right Arm)   Pulse 72   Temp (!) 100.4 F (38 C) (Oral)   Resp 20   SpO2 98%   Final Clinical Impression(s) / ED Diagnoses Final diagnoses:  Respiratory tract infection due to COVID-19 virus  Bacteremia due to methicillin susceptible Staphylococcus aureus (MSSA)    Rx / DC  Orders ED Discharge Orders    None     2:42 PM Patient recently diagnosed with COVID-19 approximately 2 and half weeks ago hospitalized for 10 days, got better but now feeling much worse.  Primary complaint is generalized fatigue, nausea and occasional vomiting.  Patient was found to be febrile with temperature of 100.4, mildly hypertensive with blood pressure of 94/62 mild tachypneic with respiratory rate of 20 however O2 sats 98% on 2 L.  She is currently being treated for bacteremia with methicillin sensitive Staph aureus her antibiotics include cefazolin and rifampin and 12/11/2019.  Patient is also insulin-dependent diabetic which may contribute to her nausea and vomiting.  Work-up initiated.  Anticipate hospitalization.  3:11 PM Pt sign out to oncoming provider who will f/u on labs, reassessment and determine disposition.    Domenic Moras, PA-C 11/10/19 [place], [place], MD 11/11/19 [phone]

## 2019-11-10 NOTE — H&P (Addendum)
[facility] Admission History and Physical Service Pager: [phone]  Patient name: Makayla Vasquez Medical record number: [id number] Date of birth: 06-18-1981 Age: 39 y.o. Gender: female  Primary Care Provider: Nuala Alpha, DO Consultants: None Code Status: Full Code Preferred Emergency Contact: Hezzie Bump, sister   Chief Complaint: general malaise, N/V, dizziness and fever   Assessment and Plan: Makayla Vasquez is a 39 y.o. female presenting with general malaise,fever, N/V, decreased PO intake in setting of recent +COVID and MSSA admission with discharge on 1/14. 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   Generalized malaise w/ N/V, Concern for sepsis with indwelling PICC line Patient on discharged on 1/13 prolonged antibiotics as outpatient including Ancef via PICC and PO rifampin for transient MSSA bacteremia. She has been receiving abx daily but experiences nausea and dizziness after taking these meds. Patient initially presented with fever 100.4 and hypotension with elevated LA of 2.0. Patient's fever with PICC line in place and despite prolonged abx concerning for infection at site of wound/bacteremia however no erythema or purulent drainage at site of PICC line insertion. Another etiology to consider is superimposed PNA but this is difficult to ascertain as patient has pulmonary fibrosis and unable to identify definite infiltrate but cannot exclude. Patient was started on Vanc and Cefepime while in the ED due to concern for sepsis. Was concerned about PE given elevated d-dimer. CTA completed with no evidence of PE.  Unclear etiology for worsening generalized fatigue/intermittent vomiting with nausea/low-grade fever, may be multifactorial, considerations for sepsis with indwelling PICC, continued Covid symptomatology as discussed below, adverse GI side effects with prolonged IV/p.o. antibiotics, gastritis,  constipation without BM reportedly >7 days  Considered UTI, however without any urinary symptoms. -Admit to progressive, attending Dr. Erin Hearing  -Consult ID in the am -MIVFs NS @ 166m/hr  -Vancomycin and cefepime, received first dose in the ED -Hold home Ancef, continue rifampin -f/u blood cultures   -Chronically on prednisone 122m may need to stress dose   COVID+  On day 19 of illness (first positive test on 1/3). Patient presents with worsening myalgias, weakness, cough, emesis, decreased appetite and general malaise in the setting of being covid positive. Patient completed 10 day course of decadron and 5 days of remdesivir therapy. D-dimer was elevated at 4.37, CTA completed and showed no evidence of PE. Patient is without respiratory complaints, no worsening SOB, and on 4 L via [place] (baseline 2-4L O2 at home). Patient without crackles on exam but does demonstrate decreased breath sounds at bilateral pulmonary bases.   Home meds include: Robitussin-DM. Procalcitonin not significantly elevated. LDH elevated at 471, which is higher than when she was admitted earlier this month. Fibrinogen elevated at 599, CRP elevated at 7.1, increased from previous admission.  Must consider that patient's fever is due to worsening covid PNA.  -continue airborne and contact precautions  -Continue supplemental oxygen as needed, goal sats greater than 88% -Follow-up a.m. CBC and CMP -Follow UA -tessalon perles 10043m-robitussin DM  -repeat covid testing   Transient MSSA bacteremia: Stable.  During recent admission, BC returned with MSSA. She had a TEE without evidence of valvular vegetations. Started on antibiotics and ultimately home regimen chosen to be Cefazolin and Rifampin (patient has a pacemaker), to continue through 11/22/2019.  -Cont Rifampin (may need to see if alternative due to likely SE -Hold cefazolin as received Vanc/cefepime tonight  -F/u BC     Elevated creatinine, BL appears  to be normal   Patient's cr was slightly elevated at 1.03.  -treat with MIVFs, will monitor volume status given patient's heart failure   Lupus  Anti-SynthetaseSyndrome(anti-Jo-1antibody, interstitial lung disease)  Patient is followed by Tonna Corner at [facility]. Home medications include CellCept, tacrolimus, prednisone 10 mg.   -Continue holding CellCept and tacrolimus at this time per recommendations of her rheumatologist -continue pred 10, may need to stress dose   Interstitial lung disease Followed by Dr. Baird Lyons with [facility] pulmonology.  Former smoker.  Home medications include albuterol 2 puffs every 4 hours as needed, prednisone 10 mg daily, Breo Ellipta, cromolyn (however stated reported as not taking this).  Patient is also on 2-4 L of oxygen via nasal cannula at baseline. Lung exam noted to have decreased breath sounds bilaterally without crackles. Patient currently on 4 liters via [place] with some supraclavicular retractions. Patient denies feeling SOB on admission.   -continue home inhalers breo and albuterol -continue home prednisone 10 mg   Hypertension BP intermittently hypotensive in ED. Baseline SBP generally around upper 90-low 100's.  Home meds include torsemide 22m and bisprolol  -Hold home torsemide and bisprolol for tonight, may start tomorrow if pressures allow    Diabetes Home meds include lantus 15 units daily and metformin 10011m Her lantus was decreased on discharge 1/14 and she actually has not be taking at home due to normoglycemia and poor appetite.  -will hold metformin  -sSSI when patient able to tolerate PO intake, will not start at this time as patient having decreased PO intake and BG have been 150s   Congestive heart failure reduced ejection fraction 35-40% Home meds include bisoprolol, lasix 20. Will need to monitor weights and I/o as we are giving patient MIVF.  Follows with Dr. BeHaroldine Lawss outpatient for cardiology.  BNP is not elevated  at 71.1 so low concern for acute HF exacerbation.  -Potentially start bisoprolol 54m60mnd torsemide 22m60m 1/22 -patient will be on telemetry during this admission -strict I/Os  -daily weights    Congenital AV block s/p pacemaker Congenital heart block underwent first PPM at age 74 (56ther had SLE). 2011 had echo with EF 35 to 40%.  The dysfunction was thought to be due to RV pacing so upgraded to CRT-P.  Patient follows with Dr. TaylLovena LeEP.  Last battery change was in 2004, according to notes patient is due for a battery change.  Constipation:  Reports no BM in over 7 days.  --Miralax BID  --Senna nightly   Insomnia Patient on home trazodone for sleep 50mg47mill hold while patient is admitted   HLD Last lipid profile was in 10/2019: Total cholesterol 156 , HDL 26 , LDL 108, triglycerides 260.  Patient is not on a statin due to intolerance, as well as antisynthetase syndrome due to concerns of elevating CK. -have patient follow up for therapy as outpatient   Allergic Rhinitis  Continue home claritin.   GERD Home meds include omeprazole 20 -continue protonix 40 mg  FEN/GI: heart healthy diet   Prophylaxis: Lovenox   Disposition: admit to cardiac tele   History of Present Illness:  Makayla Vasquez 38 y.84 female presenting with generalized malaise, decrease in appetite, feeling nauseous, vomiting, recurrent cough, generalized weakness. PMH is significant for history of asthma, CHFrEF 35-40%, diabetes, interstitial lung disease, lupus, seizures.   Ms. Ault Teodoroes that she felt that she was improving since discharge on 1/13 but within the last 3  days began to feel worse.  She reports her symptoms as including fatigue, decreased appetite, elevated temperature of 100.2, continued cough nausea, vomiting and generalized weakness.  She states that every time she took the IV and PO antibiotics, she reports feeling drained and sleepy.  Patient states that she has not been taking her  insulin that was decreased by half since discharge, but has been taking her Metformin.  Patient adds that she vomited 3 times between last night and this morning that included a yellowish/reddish vomit but denies presence of blood.  She denies abdominal pain.  Patient states that she has been constipated and has not had a bowel movement since she was here in the hospital over 1 week ago.  Patient states that she has no difficulty breathing and does not feel increasing shortness of breath and has continued on her 2-4 L of home oxygen.  She denies dysuria and denies any redness in her skin.  In the ED, patient was noted to be hypotensive to 88/61 with elevated lactic acid of 2.  As well as intermittently tachypneic up to max of 33.  Given the presence of PICC line patient was started on and thank as well as received 1500 mils of lactated Ringer's.  Patient's chest x-ray showed no new consolidation and pulmonary fibrosis consistent with her interstitial lung disease.  Review Of Systems: Per HPI with the following additions:   Review of Systems  Constitutional: Positive for fever.  HENT: Negative for sore throat.   Respiratory: Positive for cough. Negative for hemoptysis and shortness of breath.   Cardiovascular: Positive for palpitations. Negative for chest pain.  Gastrointestinal: Positive for constipation, nausea and vomiting. Negative for abdominal pain.  Genitourinary: Negative for dysuria.  Musculoskeletal: Negative for myalgias.  Skin: Negative for rash.  Neurological: Positive for dizziness and weakness.    Patient Active Problem List   Diagnosis Date Noted  . Generalized weakness 11/10/2019  . Paroxysmal tachycardia ([place]) 10/31/2019  . Bacteremia due to methicillin susceptible Staphylococcus aureus (MSSA) 10/26/2019  . COVID-19 10/24/2019  . Chronic obstructive pulmonary disease ([place])   . Increased sputum production 10/17/2019  . Acute bronchitis 06/07/2019  . Screen for STD (sexually  transmitted disease) 05/15/2019  . Moderate persistent asthma with acute exacerbation 04/19/2019  . Chronic nasal congestion 10/29/2018  . CAP (community acquired pneumonia) 10/24/2018  . Immunosuppression ([place]) 10/24/2018  . Pulmonary disease due to systemic lupus erythematosus (SLE) ([place]) 10/24/2018  . Diabetes mellitus due to therapeutic use of corticosteroid ([place]) 10/24/2018  . Right wrist pain 07/24/2018  . Anxiety state 07/21/2018  . Upper airway cough syndrome 06/18/2018  . Cor pulmonale, chronic ([place]) 04/08/2018  . Abnormal uterine bleeding 02/14/2018  . Dyspareunia, female 05/14/2017  . Sepsis without acute organ dysfunction ([place]) 01/05/2017  . Emesis 01/04/2017  . 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 On Cellcept therapy   . Lupus ([place])   . Fever   . Long term (current) use of systemic steroids   . Chronic diastolic congestive heart failure ([place])   . Cough   . Interstitial lung disease ([place])   . Anemia   . Bipolar affective disorder in remission ([place])   . Acute on chronic congestive heart failure ([place])   . 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 Adrenal insufficiency ([place])   . SOB (shortness of breath)   . Antisynthetase syndrome ([place])   . Idiopathic hypotension 07/08/2015  . Hyperlipidemia associated with type 2 diabetes mellitus ([place]) 05/04/2015  .  Diabetes mellitus type 2 in obese ([place])   . Chronic respiratory failure with hypoxia ([place]) 08/07/2014  . Acute respiratory failure with hypoxia ([place]) 06/19/2014  . Congestive dilated cardiomyopathy ([place]) 05/31/2014  . Acute on chronic HFrEF (heart failure with reduced ejection fraction) ([place]) 02/26/2012  . PPM-Medtronic   . Congenital complete AV block   . GERD (gastroesophageal reflux disease)   . 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 Polycystic ovaries 12/17/2006    Past Medical History: Past Medical History:  Diagnosis Date  . Anxiety   . Arthritis    rheumatoid arthritis- mild, no  rheumatology care   . Asthma    seasonal allergies   . Asymptomatic LV dysfunction    a. Echo in Dec 2011 with EF 35 to 40%. Felt to be due to paced rhythm. b. EF 25-30% in 07/2014.  . Bipolar affective disorder ([place])   . Cardiac pacemaker    a. Since age 65 in 67. b. Upgrade to BiV in 2013.  [name] Carpal tunnel syndrome of right wrist   . CHF (congestive heart failure) ([place])   . Congenital complete AV block   . Cor pulmonale, chronic ([place]) 04/08/2018  . Depression    bipolar  . Diabetes mellitus without complication ([place])   . GERD (gastroesophageal reflux disease)   . Hypertension   . Interstitial lung disease ([place])   . Lupus ([place])   . Lupus (systemic lupus erythematosus) ([place])   . Obesity   . Pneumonitis    a. a/w hypoxia - inflammatory - large workup 07/2014.  . Presence of permanent cardiac pacemaker   . Seizures ([place])    as a child- from high fever  . Sinus tachycardia     Past Surgical History: Past Surgical History:  Procedure Laterality Date  . ATRIAL TACH ABLATION N/A 08/14/2014   Procedure: ATRIAL TACH ABLATION;  Surgeon: [name], MD;  Location: [facility] CATH LAB;  Service: Cardiovascular;  Laterality: N/A;  . BI-VENTRICULAR PACEMAKER UPGRADE N/A 03/08/2012   Procedure: BI-VENTRICULAR PACEMAKER UPGRADE;  Surgeon: [name], MD;  Location: [facility] CATH LAB;  Service: Cardiovascular;  Laterality: N/A;  . CARPAL TUNNEL WITH CUBITAL TUNNEL Right 07/26/2013   Procedure: RIGHT LIMITED OPEN CARPAL TUNNEL RELEASE ,  RIGHT CUBITAL TUNNEL RELEASE, INSITU VERSES ULNAR NERVE DECOMPRESSION AND ANTERIOR TRANSPOSITION;  Surgeon: [name], MD;  Location: [facility];  Service: Orthopedics;  Laterality: Right;  . CESAREAN SECTION    . CHOLECYSTECTOMY    . CYST EXCISION  12/10/2012   THYROID  . INSERT / REPLACE / REMOVE PACEMAKER     2001  . IUD REMOVAL  11/03/2011   Procedure: INTRAUTERINE DEVICE (IUD) REMOVAL;  Surgeon: [name], MD;  Location: [facility];  Service: Gynecology;   Laterality: N/A;  . NASAL FRACTURE SURGERY     /w plate   . RIGHT HEART CATHETERIZATION N/A 10/26/2014   Procedure: RIGHT HEART CATH;  Surgeon: [name], MD;  Location: [facility] CATH LAB;  Service: Cardiovascular;  Laterality: N/A;  . TEE WITHOUT CARDIOVERSION N/A 10/31/2019   Procedure: TRANSESOPHAGEAL ECHOCARDIOGRAM (TEE);  Surgeon: [name], MD;  Location: [facility] ENDOSCOPY;  Service: Cardiovascular;  Laterality: N/A;  . THROAT SURGERY  1994   s/p laser treatment  . THYROGLOSSAL DUCT CYST N/A 12/10/2012   Procedure: REVISION OF THYROGLOSSAL DUCT CYST EXCISION;  Surgeon: [name], MD;  Location: [facility];  Service: ENT;  Laterality: N/A;  Revision of Thyroglossal Duct Cyst Excision  . VIDEO BRONCHOSCOPY  Bilateral 06/19/2014   Procedure: VIDEO BRONCHOSCOPY WITHOUT FLUORO;  Surgeon: Brand Males, MD;  Location: [facility];  Service: Cardiopulmonary;  Laterality: Bilateral;    Social History: Social History   Tobacco Use  . Smoking status: Former Smoker    Packs/day: 0.25    Years: 0.50    Pack years: 0.12    Types: Cigarettes    Quit date: 07/25/1996    Years since quitting: 23.3  . Smokeless tobacco: Never Used  Substance Use Topics  . Alcohol use: No  . Drug use: No    Family History: Family History  Problem Relation Age of Onset  . Heart disease Mother        CHF (no details)  . Hypertension Mother   . Lupus Mother   . Heart disease Father        Murmur  . Heart disease Sister 98        No details.  History of a pacemaker    Allergies and Medications: Allergies  Allergen Reactions  . 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 Sertraline Hcl Hives  . Tape Other (See Comments)    "Burns" the skin   No current facility-administered medications on file prior to encounter.   Current Outpatient Medications on File Prior to Encounter  Medication Sig Dispense Refill  . Alcohol Swabs (B-D  SINGLE USE SWABS REGULAR) PADS 1 application by Does not apply route 4 (four) times daily. 120 each 2  . bisoprolol (ZEBETA) 5 MG tablet Take 1 tablet (5 mg total) by mouth daily. 90 tablet 1  . Blood Glucose Calibration (ACCU-CHEK AVIVA) SOLN 1 application by In Vitro route 4 (four) times daily. 1 each 0  . blood glucose meter kit and supplies Dispense based on patient and insurance preference. Use up to four times daily as directed. (FOR ICD-10 E10.9, E11.9). 1 each 0  . ceFAZolin (ANCEF) IVPB Inject 2 g into the vein every 8 (eight) hours for 21 days. Indication:  MSSA bacteremia Last Day of Therapy:  11/22/19 Labs - Once weekly:  CBC/D and BMP, Labs - Every other week:  ESR and CRP 63 Units 0  . dextromethorphan-guaiFENesin (ROBITUSSIN-DM) 10-100 MG/5ML liquid TAKE 5 MLS BY MOUTH EVERY 4 (FOUR) HOURS AS NEEDED FOR COUGH. (Patient taking differently: Take 5 mLs by mouth every 4 (four) hours as needed for cough. ) 118 mL 0  . Etonogestrel (NEXPLANON [place]) Inject 1 Device into the skin once. Every 3 years    . ferrous sulfate 325 (65 FE) MG EC tablet Take 1 tablet (325 mg total) by mouth daily with breakfast. 90 tablet 1  . LANTUS SOLOSTAR 100 UNIT/ML Solostar Pen INJECT 15 UNITS SUBCUTANEOUSLY EVERY DAY (Patient taking differently: Inject 7 Units into the skin daily. ) 15 pen 1  . loratadine (CLARITIN) 10 MG tablet Take 1 tablet (10 mg total) by mouth daily. 30 tablet 11  . metFORMIN (GLUCOPHAGE) 1000 MG tablet TAKE 1 TABLET BY MOUTH TWICE DAILY WITH A MEAL (Patient taking differently: Take 1,000 mg by mouth 2 (two) times daily with a meal. ) 90 tablet 2  . omeprazole (PRILOSEC) 20 MG capsule TAKE 1 CAPSULE BY MOUTH EVERY DAY (Patient taking differently: Take 20 mg by mouth daily. ) 90 capsule 1  . OXYGEN Inhale 2-4 L into the lungs continuous.     . potassium chloride SA (K-DUR,KLOR-CON) 20 MEQ tablet Take 1 tablet (20 mEq  total) by mouth daily. 90 tablet 3  . predniSONE (DELTASONE) 10 MG tablet Take  1 tablet (10 mg total) by mouth daily with breakfast. 30 tablet 5  . rifampin (RIFADIN) 300 MG capsule Take 2 capsules (600 mg total) by mouth daily for 20 days. 40 capsule 0  . torsemide (DEMADEX) 20 MG tablet Take 1 tablet (20 mg total) by mouth daily. 30 tablet 0  . traZODone (DESYREL) 50 MG tablet TAKE 1 TABLET (50 MG TOTAL) BY MOUTH AT BEDTIME AS NEEDED FOR SLEEP. 90 tablet 2  . albuterol (VENTOLIN HFA) 108 (90 Base) MCG/ACT inhaler Inhale 2 puffs into the lungs every 4 (four) hours as needed for wheezing or shortness of breath. (Patient not taking: Reported on 11/10/2019) 6.7 Inhaler 0  . benzonatate (TESSALON PERLES) 100 MG capsule Take 1 capsule (100 mg total) by mouth 3 (three) times daily as needed for cough. (Patient not taking: Reported on 10/24/2019) 20 capsule 0  . cromolyn (NASALCROM) 5.2 MG/ACT nasal spray Place 1 spray into both nostrils 4 (four) times daily. (Patient not taking: Reported on 10/24/2019) 26 mL 12  . fluticasone furoate-vilanterol (BREO ELLIPTA) 100-25 MCG/INH AEPB Inhale 1 puff into the lungs daily. (Patient not taking: Reported on 10/24/2019) 1 each 5    Objective: BP (!) 86/61   Pulse 99   Temp 98.3 F (36.8 C) (Oral)   Resp (!) 35   SpO2 100%   Exam: General: Chronically ill-appearing female patient of in bed in no acute distress Eyes: No conjunctival hemorrhage no conjunctival pallor Cardiovascular: Patient has resting systolic murmur, bilateral radial pulses palpable Respiratory: Patient has restricted airway sounds, decreased aeration bilateral bases of lungs, did not appreciate crackles at this time, patient with mild supraclavicular retractions, patient able on supplemental oxygen via nasal cannula Gastrointestinal: No bowel sounds appreciated, abdomen is soft, no tenderness to palpation MSK: Patient moves all extremities Derm: No new lesions, skin at site of insertion of PICC line is without erythema or induration concerning for infection Neuro: Patient is  alert and oriented x4 PsychPatient responds appropriately to questions with normal rate of speech  Labs and Imaging: CBC BMET  Recent Labs  Lab 11/10/19 1540  WBC 6.4  HGB 9.1*  HCT 33.2*  PLT 298   Recent Labs  Lab 11/10/19 1540  NA 134*  K 3.6  CL 93*  CO2 29  BUN 14  CREATININE 1.03*  GLUCOSE 103*  CALCIUM 8.0*     EKG: prolonged QT, multi ventricular complexes   CT Angio Chest PE W and/or Wo Contrast  Result Date: 11/10/2019 CLINICAL DATA:  Malaise. Nausea vomiting for 2 days. Recent admission for sepsis. EXAM: CT ANGIOGRAPHY CHEST WITH CONTRAST TECHNIQUE: Multidetector CT imaging of the chest was performed using the standard protocol during bolus administration of intravenous contrast. Multiplanar CT image reconstructions and MIPs were obtained to evaluate the vascular anatomy. CONTRAST:  179m OMNIPAQUE IOHEXOL 350 MG/ML SOLN COMPARISON:  CTA chest, 04/08/2018. FINDINGS: Cardiovascular: There is satisfactory opacification of the pulmonary arteries to the segmental level. There is no evidence of a pulmonary embolism. Heart is normal in size. Great vessels are normal in caliber. No aortic dissection or atherosclerosis. Mediastinum/Nodes: Shoddy neck base lymph nodes, all subcentimeter. No neck base masses. Shotty mediastinal lymph nodes with generalized increased attenuation throughout the mediastinal fat. Azygos level right paratracheal node measures 1 cm short axis. Right paratracheal upper mediastinal node measures approximately 1.4 cm in short axis. Trachea is unremarkable. Mild distention of the esophagus.  Esophagus otherwise unremarkable. Lungs/Pleura: Extensive lung reticulation with intervening areas of ground-glass opacity. Ill-defined nodules also noted. Findings seen throughout both lungs, similar in appearance to the prior chest CTA. Findings consistent with sarcoidosis. Superimpose infection is not excluded. No convincing pulmonary edema. No pleural effusion or  pneumothorax. Upper Abdomen: No acute findings. Low attenuation of the liver consistent with hepatic steatosis. Musculoskeletal: No chest wall abnormality. No acute or significant osseous findings. Review of the MIP images confirms the above findings. IMPRESSION: 1. No evidence of a pulmonary embolism. 2. Extensive lung abnormalities which are similar to prior studies. There is a Peri lymphatic ill-defined nodular component of the opacities throughout the lungs raising suspicion for sarcoidosis as noted on prior high-resolution chest CTs. A superimposed acute infection is not excluded but not definitively seen on this exam. 3. Mild shotty mediastinal and neck base adenopathy without convincing change from the prior CT. Electronically Signed   By: Lajean Manes M.D.   On: 11/10/2019 20:43   DG Chest Port 1 View  Result Date: 11/10/2019 CLINICAL DATA:  39 year old female with weakness. Shortness of breath and cough. Positive COVID-19. history of chronic interstitial lung disease. EXAM: PORTABLE CHEST 1 VIEW COMPARISON:  Chest radiograph dated 10/23/2019 and several additional prior studies dating back to the CT of Sep 22, 2018. FINDINGS: A right-sided PICC with tip over central SVC noted. There is diffuse interstitial coarsening in keeping with known fibrosis. No new consolidative changes. There is no pleural effusion or pneumothorax. Stable cardiac silhouette. Left pectoral pacemaker device. No acute osseous pathology. IMPRESSION: 1. No new consolidative changes.  No interval change. 2. Pulmonary fibrosis. Electronically Signed   By: Anner Crete M.D.   On: 11/10/2019 15:10    Stark Klein, MD 11/10/2019, 9:40 PM PGY-1, [place] Intern pager: [phone], text pages welcome  FPTS Upper-Level Resident Addendum   I have independently interviewed and examined the patient. I have discussed the above with the original author and agree with their documentation. My edits for  correction/addition/clarification are in green. Please see also any attending notes.    Patriciaann Clan, DO  Family Medicine PGY-2

## 2019-11-10 NOTE — ED Triage Notes (Signed)
Pt here for malaise, n/v x 2 days-dx with covid on 1/3. Recent admit for sepsis.  Wears 2L O2 at home. 200 NS bolus and 4 mg zofran given PTA. Denies pain.

## 2019-11-10 NOTE — Progress Notes (Signed)
Pharmacy Antibiotic Note  Makayla Vasquez is a 39 y.o. female admitted on 11/10/2019 with sepsis.  Pharmacy has been consulted for cefepime dosing. Cefepime 2gm IV given earlier  Plan: Cont cefepime 2gm IV q8 hours F/u renal function, cultures and clinical course     Temp (24hrs), Avg:99.3 F (37.4 C), Min:98.3 F (36.8 C), Max:100.4 F (38 C)  Recent Labs  Lab 11/10/19 1540 11/10/19 1550 11/10/19 1822  WBC 6.4  --   --   CREATININE 1.03*  --   --   LATICACIDVEN  --  2.0* 2.0*    Estimated Creatinine Clearance: 71.5 mL/min (A) (by C-G formula based on SCr of 1.03 mg/dL (H)).    Allergies  Allergen Reactions  . 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 Sertraline Hcl Hives  . Tape Other (See Comments)    "Burns" the skin     Thank you for allowing pharmacy to be a part of this patient's care.  Woodfin Ganja 11/10/2019 10:55 PM

## 2019-11-10 NOTE — ED Provider Notes (Signed)
Transfer of Care Note I assumed care of Makayla Vasquez on 11/10/2019  Briefly, Makayla Vasquez is a 39 y.o. female who: Presents with malaise, nv The plan includes:  Febrile  covid pos  Hx of diabetes  Chronically ill   Please refer to the original provider's note for additional information regarding the care of [name].  ### Reassessment: I personally reassessed the patient:  Vital Signs:  The most current vitals were  Vitals:   11/10/19 1800 11/10/19 1830  BP: 92/62 (!) 95/55  Pulse: 68   Resp: (!) 30 (!) 33  Temp:    SpO2: 94%     Hemodynamics:  The patient is hemodynamically stable. Mental Status:  The patient is Alert  Additional MDM: On reassessment, patient is chronically ill however on my assessment patient borderline blood pressures, bedside echo was performed, decreased EF, qualitatively around 40%, IVC was mildly collapsible, likely secondary to poor p.o. intake, given 500 cc bolus additionally.  Patient was given antibiotics as well given her fever and PICC line, and given her tachypnea and new oxygen requirement as well as elevated D-dimer, CTA was obtained to the chest.  Pending at this time.  Chest x-ray clear for any pneumonia or effusion.  Echo revealed no pericardial effusion or obvious RV strain on my assessment.  Patient did have a mildly elevated lactic acid, patient has multiple comorbidities that could be contributing to her clinical picture, my concern would be either decompensated heart failure versus sepsis from bacteremia from her PICC line, or lingering Covid infection.  Patient will be admitted to the hospital for further fluid rehydration as well as monitoring.  The attending physician was present and available for all medical decision making and procedures related to this patient's care.    Jamey Reas, MD 11/10/19 [name]    Charlynne Pander, MD 11/10/19 2132

## 2019-11-11 ENCOUNTER — Other Ambulatory Visit: Payer: Self-pay

## 2019-11-11 ENCOUNTER — Encounter (HOSPITAL_COMMUNITY): Payer: Medicare HMO | Admitting: Cardiology

## 2019-11-11 ENCOUNTER — Telehealth: Payer: Medicare HMO | Admitting: Family Medicine

## 2019-11-11 ENCOUNTER — Encounter (HOSPITAL_COMMUNITY): Payer: Self-pay | Admitting: Family Medicine

## 2019-11-11 DIAGNOSIS — Z87891 Personal history of nicotine dependence: Secondary | ICD-10-CM

## 2019-11-11 DIAGNOSIS — Z950 Presence of cardiac pacemaker: Secondary | ICD-10-CM

## 2019-11-11 DIAGNOSIS — I509 Heart failure, unspecified: Secondary | ICD-10-CM

## 2019-11-11 DIAGNOSIS — R531 Weakness: Secondary | ICD-10-CM

## 2019-11-11 DIAGNOSIS — J449 Chronic obstructive pulmonary disease, unspecified: Secondary | ICD-10-CM

## 2019-11-11 DIAGNOSIS — Z8616 Personal history of COVID-19: Secondary | ICD-10-CM

## 2019-11-11 DIAGNOSIS — Z888 Allergy status to other drugs, medicaments and biological substances status: Secondary | ICD-10-CM

## 2019-11-11 DIAGNOSIS — Z91048 Other nonmedicinal substance allergy status: Secondary | ICD-10-CM

## 2019-11-11 DIAGNOSIS — R509 Fever, unspecified: Secondary | ICD-10-CM

## 2019-11-11 DIAGNOSIS — R5383 Other fatigue: Secondary | ICD-10-CM

## 2019-11-11 DIAGNOSIS — M329 Systemic lupus erythematosus, unspecified: Secondary | ICD-10-CM

## 2019-11-11 DIAGNOSIS — Z79899 Other long term (current) drug therapy: Secondary | ICD-10-CM

## 2019-11-11 DIAGNOSIS — Z8619 Personal history of other infectious and parasitic diseases: Secondary | ICD-10-CM

## 2019-11-11 LAB — RESPIRATORY PANEL BY PCR

## 2019-11-11 LAB — COMPREHENSIVE METABOLIC PANEL
ALT: 7 U/L (ref 0–44)
AST: 28 U/L (ref 15–41)
Albumin: 2.4 g/dL — ABNORMAL LOW (ref 3.5–5.0)
Alkaline Phosphatase: 49 U/L (ref 38–126)
Anion gap: 9 (ref 5–15)
BUN: 14 mg/dL (ref 6–20)
CO2: 24 mmol/L (ref 22–32)
Calcium: 7.3 mg/dL — ABNORMAL LOW (ref 8.9–10.3)
Chloride: 102 mmol/L (ref 98–111)
Creatinine, Ser: 0.91 mg/dL (ref 0.44–1.00)
GFR calc Af Amer: >60 mL/min (ref >60)
GFR calc non Af Amer: >60 mL/min (ref >60)
Glucose, Bld: 86 mg/dL (ref 70–99)
Potassium: 3.4 mmol/L — ABNORMAL LOW (ref 3.5–5.1)
Sodium: 135 mmol/L (ref 135–145)
Total Bilirubin: 0.9 mg/dL (ref 0.3–1.2)
Total Protein: 5.8 g/dL — ABNORMAL LOW (ref 6.5–8.1)

## 2019-11-11 LAB — CBC
HCT: 26.7 % — ABNORMAL LOW (ref 36.0–46.0)
Hemoglobin: 7.4 g/dL — ABNORMAL LOW (ref 12.0–15.0)
MCH: 21.9 pg — ABNORMAL LOW (ref 26.0–34.0)
MCHC: 27.7 g/dL — ABNORMAL LOW (ref 30.0–36.0)
MCV: 79 fL — ABNORMAL LOW (ref 80.0–100.0)
Platelets: 249 10*3/uL (ref 150–400)
RBC: 3.38 MIL/uL — ABNORMAL LOW (ref 3.87–5.11)
RDW: 19.7 % — ABNORMAL HIGH (ref 11.5–15.5)
WBC: 5.1 10*3/uL (ref 4.0–10.5)
nRBC: 0 % (ref 0.0–0.2)

## 2019-11-11 LAB — GLUCOSE, CAPILLARY
Glucose-Capillary: 82 mg/dL (ref 70–99)
Glucose-Capillary: 72 mg/dL (ref 70–99)
Glucose-Capillary: 90 mg/dL (ref 70–99)
Glucose-Capillary: 93 mg/dL (ref 70–99)
Glucose-Capillary: 132 mg/dL — ABNORMAL HIGH (ref 70–99)

## 2019-11-11 LAB — TROPONIN I (HIGH SENSITIVITY): Troponin I (High Sensitivity): 26 ng/L — ABNORMAL HIGH (ref <18)

## 2019-11-11 LAB — MAGNESIUM: Magnesium: 1.2 mg/dL — ABNORMAL LOW (ref 1.7–2.4)

## 2019-11-11 MED ORDER — VANCOMYCIN HCL 750 MG/150ML IV SOLN
750.0000 mg | Freq: Two times a day (BID) | INTRAVENOUS | Status: DC
  Administered 2019-11-11: 09:00:00 750 mg via INTRAVENOUS
  Filled 2019-11-11 (×2): qty 150

## 2019-11-11 MED ORDER — METHYLPREDNISOLONE SODIUM SUCC 40 MG IJ SOLR
40.0000 mg | Freq: Once | INTRAMUSCULAR | Status: AC
  Administered 2019-11-12: 06:00:00 40 mg via INTRAVENOUS
  Filled 2019-11-11: qty 1

## 2019-11-11 MED ORDER — MAGNESIUM SULFATE 2 GM/50ML IV SOLN
2.0000 g | Freq: Once | INTRAVENOUS | Status: AC
  Administered 2019-11-11: 13:00:00 2 g via INTRAVENOUS
  Filled 2019-11-11: qty 50

## 2019-11-11 MED ORDER — CHLORHEXIDINE GLUCONATE CLOTH 2 % EX PADS
6.0000 | MEDICATED_PAD | Freq: Every day | CUTANEOUS | Status: DC
  Administered 2019-11-11 – 2019-11-13 (×3): 6 via TOPICAL

## 2019-11-11 MED ORDER — SODIUM CHLORIDE 0.9 % IV SOLN
1000.0000 mL | INTRAVENOUS | Status: DC
  Administered 2019-11-11: 13:00:00 1000 mL via INTRAVENOUS

## 2019-11-11 MED ORDER — METHYLPREDNISOLONE SODIUM SUCC 40 MG IJ SOLR
40.0000 mg | Freq: Once | INTRAMUSCULAR | Status: AC
  Administered 2019-11-11: 09:00:00 40 mg via INTRAVENOUS
  Filled 2019-11-11: qty 1

## 2019-11-11 MED ORDER — FLUTICASONE FUROATE-VILANTEROL 100-25 MCG/INH IN AEPB
1.0000 | INHALATION_SPRAY | Freq: Every day | RESPIRATORY_TRACT | Status: DC
  Administered 2019-11-11 – 2019-11-13 (×3): 1 via RESPIRATORY_TRACT
  Filled 2019-11-11: qty 28

## 2019-11-11 MED ORDER — POTASSIUM CHLORIDE CRYS ER 20 MEQ PO TBCR
40.0000 meq | EXTENDED_RELEASE_TABLET | Freq: Two times a day (BID) | ORAL | Status: AC
  Administered 2019-11-11 (×2): 40 meq via ORAL
  Filled 2019-11-11 (×2): qty 2

## 2019-11-11 MED ORDER — PREDNISONE 10 MG PO TABS
10.0000 mg | ORAL_TABLET | Freq: Every day | ORAL | Status: DC
  Filled 2019-11-11: qty 1

## 2019-11-11 NOTE — Progress Notes (Signed)
Not an acute change, MD aware. Continuing to monitor.

## 2019-11-11 NOTE — Progress Notes (Signed)
Patient admitted to unit from the ED. Report received from [employer]. Patient hypotensive, tachycardic, and endorsed tachypnea on arrival. Patient on 3 liters of oxygen via nasal canula. 500 LR fluid bolus infusing on arrival via PICC line of the right brachial. CHG bath performed. Patient denies pain. Patient placed on cardiac monitor. Will continue to monitor.

## 2019-11-11 NOTE — Progress Notes (Addendum)
Family Medicine Teaching Service Daily Progress Note Intern Pager: [phone]  Patient name: Makayla Vasquez Medical record number: [id number] Date of birth: 10/12/1981 Age: 39 y.o. Gender: female  Primary Care Provider: Arlyce Harman, DO Consultants: None  Code Status: Full Code   Pt Overview and Major Events to Date:  1/21- admitted, started Vanc and cefepime   Assessment and Plan: Makayla Vasquez is a 39 y.o. female presenting with general malaise,fever, N/V, decreased PO intake in setting of recent +COVID and MSSA admission with discharge on 1/14. 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   Generalized malaise w/ N/V, Concern for  infection with indwelling PICC line  Patient initially presented with fever 100.4 and hypotension with elevated LA of 2.0. Patient's fever with PICC line in place and despite prolonged abx concerning for infection at site of wound/bacteremia however no erythema or purulent drainage at site of PICC line insertion. Patient remained afebrile overnight following admission.  Patient was intermittently tachycardic and tachypneic throughout the night with hypotension. Concern for infection, will continue to monitor blood cultures and continue antibiotics as detailed below with infectious disease recommendations to follow.  Blood cultures still pending.  Patient denies continue emesis overnight and denies nausea this morning. -MIVFs NS @ 123mL/hr  -discontinue PICC line  -added Vancomycin -continue cefepime -discontinued Ancef, continue rifampin -infectious disease consult to see all -f/u blood cultures   -RVP -Chronically on prednisone 10mg , will give stress dose of 40mg  methylprednisolone   COVID+  Patient is on day 20 of illness (first positive test on 1/3). Patient presented to ED overnight  with worsening myalgias, weakness, cough, emesis, decreased appetite and general malaise in the setting of being covid positive.  Patient completed 10 day course of decadron and 5 days of remdesivir therapy prior to recent discharge on 1/13. D-dimer was elevated at 4.37, CTA completed and showed no evidence of PE. Patient is without respiratory complaints, no worsening SOB, and on 4 L via [place] (baseline 2-4L O2 at home). Patient continues to be on 4 L via nasal cannula, denies shortness of breath or chest pain or chest tightness this morning.  Pulmonary exam notable for no wheezing and no crackles but with decreased breath sounds at bases bilaterally. -Robitussin DM.  -continue airborne and contact precautions  -Continue supplemental oxygen as needed, goal sats greater than 88% -tessalon perles 100mg   -robitussin DM   Transient MSSA bacteremia: Stable.  During recent admission, BC returned with MSSA. She had a TEE without evidence of valvular vegetations. Started on antibiotics and ultimately home regimen chosen to be Cefazolin and Rifampin (patient has a pacemaker), to continue through 11/22/2019.  -Cont Rifampin (may need to see if alternative due to likely SE -Hold cefazolin as received Vanc/cefepime  -F/u BC, no growth at 24 hrs  -infectious disease consulted, will appreciate recs   Hypokalemia Patient hypokalemic to 3.4. -We will replenish with 40 mEq twice for today -Check magnesium (1.2, low)   Elevated creatinine, BL appears to be normal  Patient's cr was slightly elevated at 1.03> 0.91 today -Will decrease fluids to rate of 100 mL/hour -treat with MIVFs, will monitor volume status given patient's heart failure   Lupus  Anti-SynthetaseSyndrome(anti-Jo-1antibody, interstitial lung disease)  Patient is followed by 2/13 at [facility] rheumatology.Home medications include CellCept, tacrolimus, prednisone 10 mg.  -Holding CellCept and tacrolimus at this time per recommendations of her rheumatologist -continue stress dose steroids   Interstitial lung disease Followed by  Dr. Jetty Duhamel with  [facility] pulmonology. Former smoker. Home medications include albuterol 2 puffs every 4 hours as needed, prednisone 10 mg daily, Breo Ellipta, cromolyn (however stated reported as not taking this). Patient is also on 2-4 L of oxygen via nasal cannula at baseline. Lung exam noted to have decreased breath sounds bilaterally without crackles no wheezing, patient is tachypneic with respiratory rate 26. Patient currently on 3.5 liters via [place].  -continue home inhalers breo and albuterol -continue stress dose    Hypertension BP intermittently hypotensive.  Baseline SBP generally around upper 90-low 100's.  Home meds include torsemide 20mg  and bisprolol.  Patient continues to be hypotensive with 90/61 BP this AM.  Overnight patient was treated with 250 mL of 5% albumin in addition to a total 2 L normal saline. -Hold home torsemide and bisprolol, pressures remain on lower side this AM  -Continue to monitor blood pressure with vitals every 2 hours  Diabetes Home meds include lantus 15 units daily and metformin 1000mg . Her lantus was decreased on discharge 1/14 and she actually has not be taking at home due to normoglycemia and poor appetite.  -will hold metformin  -sSSI when patient able to tolerate PO intake, will not start at this time as patient having decreased PO intake and BG have been 150s   Congestive heart failure reduced ejection fraction 35-40% Home meds include bisoprolol, lasix 20. Will need to monitor weights and I/o as we are giving patient MIVF.  Follows with Dr. 2/14 as outpatient for cardiology.  BNP is not elevated at 71.1 so low concern for acute HF exacerbation.  -Home meds include bisoprolol 5mg  and torsemide 20mg  but given lower BPs will continue to hold -patient will be on telemetry during this admission -strict I/Os  -daily weights    Makayla AV Vasquez s/p pacemaker Makayla Vasquez underwent first PPM at age 54 (mother had SLE). 2011 had echo with EF 35 to  40%. The dysfunction was thought to be due to RV pacing so upgraded to CRT-P. Patient follows with Dr. in EP. Last battery change was in 2004, according to notes patient is due for a battery change.  Constipation:  Reports no BM in over 7 days.  -Miralax BID  -Senna nightly   Insomnia Patient on home trazodone for sleep 50mg   -will hold while patient is admitted   HLD Last lipid profile was in 10/2019: Total cholesterol 156 , HDL 26 , LDL 108, triglycerides 260. Patient is not on a statin due to intolerance, as well as antisynthetase syndrome due to concerns of elevating CK. -have patient follow up for therapy as outpatient   Allergic Rhinitis  Continue home claritin.   GERD Home meds include omeprazole 20 -continue protonix 40 mg  FEN/GI: heart healthy diet   Prophylaxis: Lovenox   Disposition: admit to cardiac tele   Subjective:  Patient denies any further nausea or vomiting overnight.  Denies any abdominal pain.  Patient states that her breathing feels about the same and she continues to have a cough.  Denies any headache.  Objective: Temp:  [98.1 F (36.7 C)-100.4 F (38 C)] 98.5 F (36.9 C) (01/22 0800) Pulse Rate:  [68-111] 104 (01/22 0800) Resp:  [15-44] 26 (01/22 0800) BP: (86-97)/(48-69) 90/61 (01/22 0800) SpO2:  [91 %-100 %] 97 % (01/22 0800) Weight:  [67.3 kg] 67.3 kg (01/22 0100)  Physical Exam: General: Female appearing stated age and chronically ill sitting up in bed in no acute distress Cardiovascular: Systolic  murmur appreciated, bilateral radial pulses palpable, cap refill about 3 seconds Respiratory: Decreased breath sounds more at bases standing superior lobes, airways sound restricted, believe this is chronic, subtle supraclavicular retractions, patient on 3 and half liters nasal cannula Abdomen: Soft abdomen, minimal tenderness to palpation, urine or bowel sounds appreciated Extremities: No lower extremity  edema  Laboratory: Recent Labs  Lab 11/10/19 1540 11/11/19 0600  WBC 6.4 5.1  HGB 9.1* 7.4*  HCT 33.2* 26.7*  PLT 298 249   Recent Labs  Lab 11/10/19 1540 11/11/19 0600  NA 134* 135  K 3.6 3.4*  CL 93* 102  CO2 29 24  BUN 14 14  CREATININE 1.03* 0.91  CALCIUM 8.0* 7.3*  PROT 6.8 5.8*  BILITOT 0.7 0.9  ALKPHOS 64 49  ALT 6 7  AST 34 28  GLUCOSE 103* 86    Imaging/Diagnostic Tests: No new imaging this morning  Stark Klein, MD 11/11/2019, 9:25 AM PGY-1, [place] Intern pager: [phone], text pages welcome

## 2019-11-11 NOTE — Progress Notes (Signed)
MEWS  Patient initially MEWS of red on admission but improved to a yellow.    MEWS changed from yellow back to Red for HR and RR (see flowsheet). MD made aware. Will continue to monitor.

## 2019-11-11 NOTE — Consult Note (Addendum)
[facility]       Reason for Consult: fever    Referring Physician: Dr. Deirdre Priest  Active Problems:   Generalized weakness   Bacteremia   . Chlorhexidine Gluconate Cloth  6 each Topical Daily  . enoxaparin (LOVENOX) injection  40 mg Subcutaneous Q24H  . fluticasone furoate-vilanterol  1 puff Inhalation Daily  . loratadine  10 mg Oral Daily  . [START ON 11/12/2019] methylPREDNISolone (SOLU-MEDROL) injection  40 mg Intravenous Once  . pantoprazole  40 mg Oral Daily  . polyethylene glycol  17 g Oral BID  . potassium chloride  40 mEq Oral BID  . rifampin  600 mg Oral Daily  . senna  1 tablet Oral QHS    Recommendations: Continue cefepime for now I will stop vancomycin   Assessment: She had a mild fever to 100.4 and fever at home, some chills and concern for a line infection.  Picc line has been removed.  She should stay on the cefepime for now, will stop the vancomycin.  Continue with rifampin.  If her blood cultures remain negative on Sunday, can replace the picc line and continue with cefazolin + rifampin through February 2nd.   Also with fatigue,  Likely due to recent COVID, bacteremia.  I anticipate a prolonged recovery period, particularly with her underlying medical issues.  Can remove isolation after the 21 days from the first test.    Antibiotics: Vancomycin, cefepime, rifampin  HPI: Makayla Vasquez is a 39 y.o. female with many medical problems including COPD, SLE, on chronic immunosuppresive medications, PPM and recently in the hospital with COVID with MSSA bacteremia here with fatigue, fever.  Tmax 100.4.  Picc line removed with concern for picc line infection.  + fatigue since having COVID.  No associated diarrhea or rash.  CT of chest done with concern for PE but no acute findings.  Lactate minimally elevated, creat elevated. Has heart failure and on lasix.     Review of Systems:  Constitutional: positive for fevers, chills, fatigue,  malaise and anorexia Gastrointestinal: negative for diarrhea Integument/breast: negative for rash All other systems reviewed and are negative    Past Medical History:  Diagnosis Date  . Anxiety   . Arthritis    rheumatoid arthritis- mild, no rheumatology care   . Asthma    seasonal allergies   . Asymptomatic LV dysfunction    a. Echo in Dec 2011 with EF 35 to 40%. Felt to be due to paced rhythm. b. EF 25-30% in 07/2014.  . Bipolar affective disorder (HCC)   . Cardiac pacemaker    a. Since age 83 in 24. b. Upgrade to BiV in 2013.  [name] Carpal tunnel syndrome of right wrist   . CHF (congestive heart failure) (HCC)   . Congenital complete AV block   . Cor pulmonale, chronic (HCC) 04/08/2018  . Depression    bipolar  . Diabetes mellitus without complication (HCC)   . GERD (gastroesophageal reflux disease)   . Hypertension   . Interstitial lung disease (HCC)   . Lupus (HCC)   . Lupus (systemic lupus erythematosus) (HCC)   . Obesity   . Pneumonitis    a. a/w hypoxia - inflammatory - large workup 07/2014.  . Presence of permanent cardiac pacemaker   . Seizures (HCC)    as a child- from high fever  . Sinus tachycardia     Social History   Tobacco Use  . Smoking status: Former Smoker  Packs/day: 0.25    Years: 0.50    Pack years: 0.12    Types: Cigarettes    Quit date: 07/25/1996    Years since quitting: 23.3  . Smokeless tobacco: Never Used  Substance Use Topics  . Alcohol use: No  . Drug use: No    Family History  Problem Relation Age of Onset  . Heart disease Mother        CHF (no details)  . Hypertension Mother   . Lupus Mother   . Heart disease Father        Murmur  . Heart disease Sister 22        No details.  History of a pacemaker    Allergies  Allergen Reactions  . 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 Sertraline Hcl Hives  . Tape Other (See Comments)    "Burns" the skin     Physical Exam: Constitutional: in no apparent distress  Vitals:   11/11/19 0645 11/11/19 0800  BP: (!) 87/64 90/61  Pulse: (!) 102 (!) 104  Resp: 15 (!) 26  Temp: 99 F (37.2 C) 98.5 F (36.9 C)  SpO2: 100% 97%   EYES: anicteric Cardiovascular: Cor RRR Respiratory: clear; normal respiratory effort GI: Bowel sounds are normal, liver is not enlarged, spleen is not enlarged Musculoskeletal: no pedal edema noted Skin: negatives: no rash Neuro: non-focal  Lab Results  Component Value Date   WBC 5.1 11/11/2019   HGB 7.4 (L) 11/11/2019   HCT 26.7 (L) 11/11/2019   MCV 79.0 (L) 11/11/2019   PLT 249 11/11/2019    Lab Results  Component Value Date   CREATININE 0.91 11/11/2019   BUN 14 11/11/2019   NA 135 11/11/2019   K 3.4 (L) 11/11/2019   CL 102 11/11/2019   CO2 24 11/11/2019    Lab Results  Component Value Date   ALT 7 11/11/2019   AST 28 11/11/2019   ALKPHOS 49 11/11/2019     Microbiology: Recent Results (from the past 240 hour(s))  Blood Culture (routine x 2)     Status: None (Preliminary result)   Collection Time: 11/10/19  3:42 PM   Specimen: BLOOD LEFT ARM  Result Value Ref Range Status   Specimen Description BLOOD LEFT ARM  Final   Special Requests   Final    BOTTLES DRAWN AEROBIC AND ANAEROBIC Blood Culture adequate volume   Culture   Final    NO GROWTH < 24 HOURS Performed at [facility], [address] [place], [place] [address]    Report Status PENDING  Incomplete  SARS CORONAVIRUS 2 (TAT 6-24 HRS) Nasopharyngeal Nasopharyngeal Swab     Status: Abnormal   Collection Time: 11/10/19  3:55 PM   Specimen: Nasopharyngeal Swab  Result Value Ref Range Status   SARS Coronavirus 2 POSITIVE (A) NEGATIVE Final    Comment: RESULT CALLED TO, READ BACK BY AND VERIFIED WITH: Riki Altes, RN AT 2158 ON 11/10/2019 BY SAINVILUS S (NOTE) SARS-CoV-2 target nucleic acids are DETECTED. The SARS-CoV-2 RNA is generally detectable in upper and lower respiratory  specimens during the acute phase of infection. Positive results are indicative of the presence of SARS-CoV-2 RNA. Clinical correlation with patient history and other diagnostic information is  necessary to determine patient infection status. Positive results do not rule out bacterial infection or co-infection with other viruses.  The expected result is Negative. Fact Sheet for Patients:  SugarRoll.be Fact Sheet for Healthcare Providers: https://www.woods-mathews.com/ This test is not yet approved or cleared by the [place] FDA and  has been authorized for detection and/or diagnosis of SARS-CoV-2 by FDA under an Emergency Use Authorization (EUA). This EUA will remain  in effect (meaning this test can be  used) for the duration of the COVID-19 declaration under Section 564(b)(1) of the Act, 21 U.S.C. section 360bbb-3(b)(1), unless the authorization is terminated or revoked sooner. Performed at [facility], [place] [address], [place], [place] [address]   Blood Culture (routine x 2)     Status: None (Preliminary result)   Collection Time: 11/10/19  3:55 PM   Specimen: BLOOD  Result Value Ref Range Status   Specimen Description BLOOD SITE NOT SPECIFIED  Final   Special Requests   Final    BOTTLES DRAWN AEROBIC ONLY Blood Culture adequate volume   Culture   Final    NO GROWTH < 24 HOURS Performed at [facility], [place] [address] [place], [place] [address]    Report Status PENDING  Incomplete    Thayer Headings, [facility] [place] [email or web address] 11/11/2019, 11:28 AM

## 2019-11-11 NOTE — Discharge Summary (Signed)
[facility] Discharge Summary  Patient name: Makayla Vasquez Medical record number: [id number] Date of birth: 06/28/1981 Age: 39 y.o. Gender: female Date of Admission: 11/10/2019  Date of Discharge: 11/13/2019 Admitting Physician: Vondra Brimage, MD  Primary Care Provider: Lockamy, Timothy, DO Consultants: Infectious disease  Indication for Hospitalization: FEVER W/ N/V IN SETTING OF MSSA BACTEREMIA AND RECENT COVID19 INFECTION  Discharge Diagnoses/Problem List:  Active Problems:   Generalized weakness   Bacteremia  Patient Active Problem List   Diagnosis Date Noted  . Generalized weakness 11/10/2019  . Bacteremia 11/10/2019  . Paroxysmal tachycardia (HCC) 10/31/2019  . Bacteremia due to methicillin susceptible Staphylococcus aureus (MSSA) 10/26/2019  . COVID-19 10/24/2019  . Chronic obstructive pulmonary disease (HCC)   . Increased sputum production 10/17/2019  . Acute bronchitis 06/07/2019  . Screen for STD (sexually transmitted disease) 05/15/2019  . Moderate persistent asthma with acute exacerbation 04/19/2019  . Chronic nasal congestion 10/29/2018  . CAP (community acquired pneumonia) 10/24/2018  . Immunosuppression (HCC) 10/24/2018  . 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 Diabetes mellitus due to therapeutic use of corticosteroid (HCC) 10/24/2018  . Right wrist pain 07/24/2018  . Anxiety state 07/21/2018  . Upper airway cough syndrome 06/18/2018  . Cor pulmonale, chronic (HCC) 04/08/2018  . Abnormal uterine bleeding 02/14/2018  . Dyspareunia, female 05/14/2017  . Sepsis without acute organ dysfunction (HCC) 01/05/2017  . Emesis 01/04/2017  . 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 On Cellcept therapy   . Lupus (HCC)   . Fever   . Long term (current) use of systemic steroids   . Chronic diastolic congestive heart failure (HCC)   . Cough   . Interstitial lung disease (HCC)   . Anemia   . Bipolar  affective disorder in remission (HCC)   . Acute on chronic congestive heart failure (HCC)   . 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 Adrenal insufficiency (HCC)   . SOB (shortness of breath)   . Antisynthetase syndrome (HCC)   . Idiopathic hypotension 07/08/2015  . Hyperlipidemia associated with type 2 diabetes mellitus (HCC) 05/04/2015  . Diabetes mellitus type 2 in obese (HCC)   . Chronic respiratory failure with hypoxia (HCC) 08/07/2014  . Acute respiratory failure with hypoxia (HCC) 06/19/2014  . Congestive dilated cardiomyopathy (HCC) 05/31/2014  . Acute on chronic HFrEF (heart failure with reduced ejection fraction) (HCC) 02/26/2012  . PPM-[employer]tr[employer]   . Congenital complete AV block   . GERD (gastroesophageal reflux disease)   . 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 Polycystic ovaries 12/17/2006    Disposition: discharge home   Discharge Condition: stable & improved  Discharge Exam:   General: Female chronically ill-appearing, sitting up in bed in no acute distress having breakfast Cardiovascular: Hyperdynamic heart sounds consistent with pacemaker, bilateral radial pulses palpable Respiratory: Decreased breath sounds, restricted aeration, patient on nasal cannula 4L, no retractions noted, no apparent increased work of breathing Abdomen: Soft and nontender to palpation, occasional bowel sounds appreciated Extremities: No lower extremity edema  Brief Hospital Course:   Batoul L [name] is a 39 y.o. female who presented with nausea, vomiting, general malaise and decreased p.o. intake after recent discharge for MSSA bacteremia and Covid-19 fever. PMH is significant for pacemaker, lupus, residual lung disease, hypertension, and heart failure with reduced ejection fraction.  Generalized malaise w/ N/V,Concern for infectionwith indwelling PICC line Patient initially presented with fever 100.4, tachypnea and hypotension with elevated LA of 2.0. Patient's    fever with PICC line in place along with current abx (Ancef and rifampin) was concerning for potential infection at the site of PICC line insertion versus new bacteremia that was treated by Ancef and Rifampin.  New blood cultures were drawn during his hospitalization and were negative throughout this admission. Infectious disease was consulted and treated the patient with vancomycin  ( V1/21-1/22 ), held Ancef and started  cefepime (1/21-1/24). Her original PICC line was removed after admission and subsequently replaced on 1/24 in order to complete her course of  Ancef as outpatient with end date of 11/22/19. Ancef was restarted prior to discharging home.  Patient remained afebrile once arriving to the floor until the time of discharge. RVP panel was a negative. Ms. Roberge was also treated with stress dose of steroids due to current treatment with rifampin likely causing accelerated metabolizing of her home prednisone course. She received two days of methylprednisolone 40mg and her home prednisone was increased on discharge.   COVID PNA, positive test on 1/3  Patient was late in her disease course upon readmission but with worsening symptoms, she was continued on airborne and contact precautions. Ms. Stum was treated for her cough with robitussin and tessalon perles. She also remained on supplemental oxygen ranging from 3-4 liters throughout her stay with appropriate oxygen saturations. Patient completed 10 day course of decadron and 5 days of remdesivir therapy prior to recent discharge on 1/13.D-dimer on admission was elevated at 4.37, CTA was completed on 1/21 and showed no evidence of PE. Patient had no respiratory complaints throughout admission and lung exam remained with decreased breath sounds, patient had no retractions and appeared comfortable on supplemental oxygen. Patient was discharge with her home oxygen with isolation recommendations to end on 1/24.   Transient MSSA bacteremia: Stable.  During  recent admission, BC returned with MSSA. She had a TEE without evidence of valvular vegetations. Patient was started on antibiotics and ultimately home regimen chosen to be Cefazolin and Rifampin (patient has a pacemaker), to continue through 11/22/2019. New blood cultures on this admission remain with no growth to date. While admitted she was treated with Vanc and Cefepime as detailed above. Patient's PICC line was replaced prior to discharge and she was re-started on Ancef prior to going home. Rifampin was continued throughout this hospital stay.    Hypokalemia Patient was treated for low potassium with oral repletion. K at the time of discharge was 4.2.   Elevated creatinine  Patient's creatinine on admission was slightly elevated at 1.03 but after treatment with maintenance IVFs, Cr decreased to 0.91 by hospital day 2.   Lupus  Anti-SynthetaseSyndrome(anti-Jo-1antibody, interstitial lung disease) Patient is followed by Dr.Yael Klionosky at [facility] rheumatology.Home medications include CellCept, tacrolimus, prednisone 10 mg. During her recent hospitalization, the patient was recommended to hold CellCept and tacrolimus given covid diagnosis. These medications were held during this hospital stay to be restarted at her follow up rheumatologist visit. Ms. Graves was treated with stress dose steroids and her home prednisone was increased upon discharge.   Interstitial lung disease Followed by Dr. Clinton Young with [facility] pul[facility]ology. Home medications include albuterol 2 puffs every 4 hours as needed, prednisone 10 mg daily, Breo Ellipta,cromolyn(however stated reported as not taking this). Patient was continued on home supplemental oxygen via nasal cannula at baseline, ranging from 2-4 liters. Home inhalers were continued throughout this hospital stay.   Hypertension BP was intermittently hypotensive.  Baseline SBP generally around upper 90s-low 100's.Home meds include torsemide 20mg and    bisprolol.  Patient's case was discussed with critical care medicine and was recommended to give 250 mL of 5% albumin in addition to a total 2 L normal saline. Her home medications were held due to lower Bps and her blood pressure was monitored regularly with vitals.  Prior to discharge, BP was 108/81.   Diabetes Patient's home medications  include lantus 15 units daily and metformin 1000mg.Her lantus was decreased on discharge on 1/14 and she had not been taking her insulin at home due to normoglycemia and poor appetite.Patient had been taking her metformin in the interim. Her metformin was held during this admission and CBGs were monitored and within range to not require sliding scale insulin. Max CBG was 156 during this stay.   Congestive heart failurereduced ejection fraction35-40% Home meds include bisoprolol and lasix 20. Patient follows with Dr. Bensimhon as outpatient for cardiology.  Her BNP was not elevated at 71.1. Patient was monitored on telemetry and her home meds were held given lower blood pressures. Patient was weighed daily and urinary output was monitored as patient did received IV fluids due to concern for sepsis.   Congenital AV block s/p pacemaker Congenital heart block underwent first PPM at age 15 (mother had SLE). 2011 had echo with EF 35 to 40%. The dysfunction was thought to be due to RV pacing so upgraded to CRT-P. Patient follows with Dr. Taylor in EP. Last battery change was in 2004, according to notes patient is due for a battery change. There were no acute issues or changes to the patient's pacemaker during this hospital stay.   Constipation: Ms. Ceniceros initially reported not having a bowel movement in over 1 week so she was treated with miralax, senna and magnesium citrate. She was able to have a bowel movement and had not other complaints of constipation or abdominal pain.  Insomnia Patient's home trazodone was held during this admission. She reported no  issues with sleep.   HLD Last lipid profile was in1/2021: Total cholesterol156, HDL 26, LDL 108, triglycerides260. Patient is not on a statin due to intolerance, as well as antisynthetase syndromedue to concerns of elevating CK. Patient will be recommended for outpatient follow up for lipids.   Allergic Rhinitis  Patient was continued on her home claritin.  GERD Patient was continued on her home meds include omeprazole 20.    Issues for Follow Up:  1. Rheumatology: Patient's CellCept and tacrolimus have been held during her acute illness.  Recommended patient follow-up for when she should restart these medications 2. Recommend the patient follow-up with out patient provider in order to ensure her Metformin and Lantus dosing is appropriate post discharge. Home regimen includes 15 units lantus at night and metformin 1000mg twice daily. Patient was decreased to 7 units of lantus following previous discharge. Please adjust as appropriate and check CBG.  3. Confirm that patient is taking increased daily prednisone as prescribed below.  4. Confirm that patient has follow up with her rheumatologist and has recommendations on when to restart CellCept and Tacrolimus.   Significant Procedures: None  Significant Labs and Imaging:  Recent Labs  Lab 11/12/19 0416 11/12/19 1747 11/13/19 0256  WBC 5.1 4.2 4.6  HGB 7.2* 8.4* 7.3*  HCT 26.6* 30.9* 26.6*  PLT 271 282 272   Recent Labs  Lab 11/10/19 1540 11/10/19 1540 11/11/19 0600 11/11/19 0600 11/12/19 0416 11/13/19 0256  NA 134*  --  135  --  134* 137  K 3.6   < > 3.4*   < >   4.5 4.2  CL 93*  --  102  --  105 108  CO2 29  --  24  --  21* 20*  GLUCOSE 103*  --  86  --  94 164*  BUN 14  --  14  --  12 11  CREATININE 1.03*  --  0.91  --  0.79 0.77  CALCIUM 8.0*  --  7.3*  --  7.6* 7.6*  MG  --   --  1.2*  --   --  1.7  ALKPHOS 64  --  49  --   --   --   AST 34  --  28  --   --   --   ALT 6  --  7  --   --   --   ALBUMIN 2.6*   --  2.4*  --   --   --    < > = values in this interval not displayed.    US EKG SITE RITE  Result Date: 11/13/2019 If Site Rite image not attached, placement could not be confirmed due to current cardiac rhythm.    Results/Tests Pending at Time of Discharge: none   Discharge Medications:  Allergies as of 11/13/2019      Reactions   Atorvastatin Other (See Comments)   Possible statin-induced myopathy with elevated CK if on Lipitor 40 mg   Sertraline Hcl Hives   Tape Other (See Comments)   "Burns" the skin      Medication List    STOP taking these medications   potassium chloride SA 20 MEQ tablet Commonly known as: KLOR-CON   torsemide 20 MG tablet Commonly known as: DEMADEX     TAKE these medications   Accu-Chek Aviva Soln 1 application by In Vitro route 4 (four) times daily.   albuterol 108 (90 Base) MCG/ACT inhaler Commonly known as: VENTOLIN HFA Inhale 2 puffs into the lungs every 4 (four) hours as needed for wheezing or shortness of breath.   B-D SINGLE USE SWABS REGULAR Pads 1 application by Does not apply route 4 (four) times daily.   benzonatate 100 MG capsule Commonly known as: Tessalon Perles Take 1 capsule (100 mg total) by mouth 3 (three) times daily as needed for cough.   bisoprolol 5 MG tablet Commonly known as: ZEBETA Take 0.5 tablets (2.5 mg total) by mouth daily. What changed: how much to take   blood glucose meter kit and supplies Dispense based on patient and insurance preference. Use up to four times daily as directed. (FOR ICD-10 E10.9, E11.9).   Breo Ellipta 100-25 MCG/INH Aepb Generic drug: fluticasone furoate-vilanterol Inhale 1 puff into the lungs daily.   ceFAZolin  IVPB Commonly known as: ANCEF Inject 2 g into the vein every 8 (eight) hours for 21 days. Indication:  MSSA bacteremia Last Day of Therapy:  11/22/19 Labs - Once weekly:  CBC/D and BMP, Labs - Every other week:  ESR and CRP What changed: Another medication with the  same name was added. Make sure you understand how and when to take each.   ceFAZolin  IVPB Commonly known as: ANCEF Inject 2 g into the vein every 8 (eight) hours for 9 days. Indication: MSSA bacteremia Last Day of Therapy:  11/22/2019 Labs - Once weekly:  CBC/D and BMP, Labs - Every other week:  ESR and CRP What changed: You were already taking a medication with the same name, and this prescription was added. Make sure you understand how and when   to take each.   cromolyn 5.2 MG/ACT nasal spray Commonly known as: NasalCrom Place 1 spray into both nostrils 4 (four) times daily.   dextromethorphan-guaiFENesin 10-100 MG/5ML liquid Commonly known as: ROBITUSSIN-DM TAKE 5 MLS BY MOUTH EVERY 4 (FOUR) HOURS AS NEEDED FOR COUGH. What changed: See the new instructions.   ferrous sulfate 325 (65 FE) MG EC tablet Take 1 tablet (325 mg total) by mouth daily with breakfast.   Lantus SoloStar 100 UNIT/ML Solostar Pen Generic drug: Insulin Glargine INJECT 15 UNITS SUBCUTANEOUSLY EVERY DAY What changed: See the new instructions.   loratadine 10 MG tablet Commonly known as: CLARITIN Take 1 tablet (10 mg total) by mouth daily.   metFORMIN 1000 MG tablet Commonly known as: GLUCOPHAGE TAKE 1 TABLET BY MOUTH TWICE DAILY WITH A MEAL What changed: See the new instructions.   NEXPLANON [place] Inject 1 Device into the skin once. Every 3 years   omeprazole 20 MG capsule Commonly known as: PRILOSEC TAKE 1 CAPSULE BY MOUTH EVERY DAY What changed: how much to take   OXYGEN Inhale 2-4 L into the lungs continuous.   predniSONE 20 MG tablet Commonly known as: DELTASONE Take 40 mg for 3 days, then 20 mg for 3 days, then restart your home 10 mg. What changed:   medication strength  how much to take  how to take this  when to take this  additional instructions   rifampin 300 MG capsule Commonly known as: RIFADIN Take 2 capsules (600 mg total) by mouth daily for 20 days.   traZODone 50 MG  tablet Commonly known as: DESYREL TAKE 1 TABLET (50 MG TOTAL) BY MOUTH AT BEDTIME AS NEEDED FOR SLEEP.            Home Infusion Instuctions  (From admission, onward)         Start     Ordered   11/13/19 0000  Home infusion instructions    Question:  Instructions  Answer:  Flushing of vascular access device: 0.9% NaCl pre/post medication administration and prn patency; Heparin 100 u/ml, 5ml for implanted ports and Heparin 10u/ml, 5ml for all other central venous catheters.   11/13/19 1723          Discharge Instructions: Please refer to Patient Instructions section of EMR for full details.  Patient was counseled important signs and symptoms that should prompt return to medical care, changes in medications, dietary instructions, activity restrictions, and follow up appointments.   Follow-Up Appointments: Follow-up Information    McLean, Dalton S, MD .   Specialty: Cardiology Contact information: [address] [place] [address] [phone]        Fletcher, Jacob, MD Follow up on 11/18/2019.   Specialty: Family Medicine Why: 10:30AM Contact information: [address] [place]  [address] [phone]           Simmons, , MD 11/15/2019, 9:07 PM PGY-1, [facility]  

## 2019-11-11 NOTE — Care Management (Signed)
CM acknowledges consult for medication assistance.  Pt has current insurance.  TOC reached out to attending to determine what medications pt may need assistance with - group to follow up with CM

## 2019-11-11 NOTE — Progress Notes (Signed)
MD made aware, no acute changes. Vitals every 2 hours. MD in room with nurse with patient. Will continue to monitor.

## 2019-11-11 NOTE — Progress Notes (Signed)
Asked patient if she wanted me to update family. She refused.

## 2019-11-11 NOTE — Progress Notes (Addendum)
Pharmacy Antibiotic Note  Makayla Vasquez is a 39 y.o. female admitted on 11/10/2019 with sepsis.Tmax 100.4, WBC 5.1, LA 2. Pharmacy has been consulted for vancomycin and cefepime dosing.   Of note, pt recently discharged on 1/13w/ IV Cefazolin 2 g IV Q8H via PICCand PO rifampin for transient MSSA bacteremia w/ end date of 11/22/2019  Pt received cefepime 2 g x1 and Vancomycin 1250 mg x1 in ED.   Plan: - Start Vancomycin 750 mg IV q12h (estAUC 466, Scr used 0.91, vd 0.72, goalAUC 400-550) - Continue cefepime 2gm IV q8 hours - F/u renal function, cultures and clinical course  Height: 5\' 5"  (165.1 cm) Weight: 148 lb 5.9 oz (67.3 kg) IBW/kg (Calculated) : 57  Temp (24hrs), Avg:98.7 F (37.1 C), Min:98.1 F (36.7 C), Max:100.4 F (38 C)  Recent Labs  Lab 11/10/19 1540 11/10/19 1550 11/10/19 1822 11/11/19 0600  WBC 6.4  --   --  5.1  CREATININE 1.03*  --   --  0.91  LATICACIDVEN  --  2.0* 2.0*  --     Estimated Creatinine Clearance: 74.7 mL/min (by C-G formula based on SCr of 0.91 mg/dL).    Allergies  Allergen Reactions  . 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 Sertraline Hcl Hives  . Tape Other (See Comments)    "Burns" the skin   Antibiotics this admission: Cefepime 1/21 >> Vancomycin 1/21 >> Rifampin >> (2/2)  Microbiology: 1/21: Bcx - pending  Thank you for allowing pharmacy to be a part of this patient's care.  2/21, PharmD PGY1 Ambulatory Care Resident [employer] # [phone]

## 2019-11-11 NOTE — Plan of Care (Signed)
  Problem: Education: Goal: Knowledge of risk factors and measures for prevention of condition will improve Outcome: Not Progressing   Problem: Coping: Goal: Psychosocial and spiritual needs will be supported Outcome: Not Progressing   Problem: Respiratory: Goal: Will maintain a patent airway Outcome: Not Progressing Goal: Complications related to the disease process, condition or treatment will be avoided or minimized Outcome: Not Progressing   

## 2019-11-12 DIAGNOSIS — R7881 Bacteremia: Principal | ICD-10-CM

## 2019-11-12 DIAGNOSIS — B9561 Methicillin susceptible Staphylococcus aureus infection as the cause of diseases classified elsewhere: Secondary | ICD-10-CM

## 2019-11-12 LAB — CBC WITH DIFFERENTIAL/PLATELET
Abs Immature Granulocytes: 0.02 10*3/uL (ref 0.00–0.07)
Abs Immature Granulocytes: 0.02 10*3/uL (ref 0.00–0.07)
Basophils Absolute: 0 10*3/uL (ref 0.0–0.1)
Basophils Absolute: 0 10*3/uL (ref 0.0–0.1)
Basophils Relative: 1 %
Basophils Relative: 1 %
Eosinophils Absolute: 0.4 10*3/uL (ref 0.0–0.5)
Eosinophils Absolute: 0.3 10*3/uL (ref 0.0–0.5)
Eosinophils Relative: 8 %
Eosinophils Relative: 7 %
HCT: 26.6 % — ABNORMAL LOW (ref 36.0–46.0)
HCT: 30.9 % — ABNORMAL LOW (ref 36.0–46.0)
Hemoglobin: 7.2 g/dL — ABNORMAL LOW (ref 12.0–15.0)
Hemoglobin: 8.4 g/dL — ABNORMAL LOW (ref 12.0–15.0)
Immature Granulocytes: 0 %
Immature Granulocytes: 1 %
Lymphocytes Relative: 20 %
Lymphocytes Relative: 39 %
Lymphs Abs: 1 10*3/uL (ref 0.7–4.0)
Lymphs Abs: 1.6 10*3/uL (ref 0.7–4.0)
MCH: 21.6 pg — ABNORMAL LOW (ref 26.0–34.0)
MCH: 21.4 pg — ABNORMAL LOW (ref 26.0–34.0)
MCHC: 27.1 g/dL — ABNORMAL LOW (ref 30.0–36.0)
MCHC: 27.2 g/dL — ABNORMAL LOW (ref 30.0–36.0)
MCV: 79.6 fL — ABNORMAL LOW (ref 80.0–100.0)
MCV: 78.6 fL — ABNORMAL LOW (ref 80.0–100.0)
Monocytes Absolute: 0.5 10*3/uL (ref 0.1–1.0)
Monocytes Absolute: 0.4 10*3/uL (ref 0.1–1.0)
Monocytes Relative: 11 %
Monocytes Relative: 11 %
Neutro Abs: 3.1 10*3/uL (ref 1.7–7.7)
Neutro Abs: 1.7 10*3/uL (ref 1.7–7.7)
Neutrophils Relative %: 60 %
Neutrophils Relative %: 41 %
Platelets: 271 10*3/uL (ref 150–400)
Platelets: 282 10*3/uL (ref 150–400)
RBC: 3.34 MIL/uL — ABNORMAL LOW (ref 3.87–5.11)
RBC: 3.93 MIL/uL (ref 3.87–5.11)
RDW: 19.7 % — ABNORMAL HIGH (ref 11.5–15.5)
RDW: 19.6 % — ABNORMAL HIGH (ref 11.5–15.5)
WBC: 5.1 10*3/uL (ref 4.0–10.5)
WBC: 4.2 10*3/uL (ref 4.0–10.5)
nRBC: 0 % (ref 0.0–0.2)
nRBC: 0 % (ref 0.0–0.2)

## 2019-11-12 LAB — BASIC METABOLIC PANEL
Anion gap: 8 (ref 5–15)
BUN: 12 mg/dL (ref 6–20)
CO2: 21 mmol/L — ABNORMAL LOW (ref 22–32)
Calcium: 7.6 mg/dL — ABNORMAL LOW (ref 8.9–10.3)
Chloride: 105 mmol/L (ref 98–111)
Creatinine, Ser: 0.79 mg/dL (ref 0.44–1.00)
GFR calc Af Amer: >60 mL/min (ref >60)
GFR calc non Af Amer: >60 mL/min (ref >60)
Glucose, Bld: 94 mg/dL (ref 70–99)
Potassium: 4.5 mmol/L (ref 3.5–5.1)
Sodium: 134 mmol/L — ABNORMAL LOW (ref 135–145)

## 2019-11-12 LAB — GLUCOSE, CAPILLARY
Glucose-Capillary: 116 mg/dL — ABNORMAL HIGH (ref 70–99)
Glucose-Capillary: 156 mg/dL — ABNORMAL HIGH (ref 70–99)
Glucose-Capillary: 128 mg/dL — ABNORMAL HIGH (ref 70–99)
Glucose-Capillary: 122 mg/dL — ABNORMAL HIGH (ref 70–99)

## 2019-11-12 MED ORDER — GUAIFENESIN-DM 100-10 MG/5ML PO SYRP
5.0000 mL | ORAL_SOLUTION | ORAL | Status: DC | PRN
  Administered 2019-11-12 (×2): 5 mL via ORAL
  Filled 2019-11-12 (×2): qty 5

## 2019-11-12 MED ORDER — PREDNISONE 20 MG PO TABS
40.0000 mg | ORAL_TABLET | Freq: Every day | ORAL | Status: DC
  Administered 2019-11-13: 08:00:00 40 mg via ORAL
  Filled 2019-11-12: qty 2

## 2019-11-12 NOTE — Progress Notes (Signed)
BP=89/65. BP repeated manually and was 98/64 on left arm and 100/68 on right arm. Dr. Rachael Darby was made aware. Continue to monitor.

## 2019-11-12 NOTE — Progress Notes (Signed)
MD aware, continue to monitor.

## 2019-11-12 NOTE — Evaluation (Signed)
Physical Therapy Evaluation & Discharge Patient Details Name: Makayla Vasquez MRN: [id number] DOB: [birth year] Today's Date: 11/12/2019   History of Present Illness  Pt is a 39 y.o. female with recent admission (d/c home 11/03/19) for (+) COVID and MMSA, now readmitted 11/10/19 with generalized malaise. Concern for infection with indwelling PICC line. PMH includes lupus, antisynthetase syndrome, ILD, pacemaker for congenital AV block, pulmonary hypertension, chronic dilated cardiomyopathy, T2DM, GERD, HLD.    Clinical Impression  Patient evaluated by Physical Therapy with no further acute PT needs identified. PTA, pt lives at home with son, sister assists as needed since recent hospital admission; pt reports she has adequate support. Today, pt received in recliner; RN reports ambulatory without assist. Educ re: incentive spirometer use (pt pulling <250 mL), importance of frequent mobility, seated/standing therex. All education has been completed and the patient has no further questions. Acute PT is signing off. Thank you for this referral.    Follow Up Recommendations No PT follow up;Supervision - Intermittent    Equipment Recommendations  None recommended by PT    Recommendations for Other Services       Precautions / Restrictions Precautions Precautions: None Restrictions Weight Bearing Restrictions: No      Mobility  Bed Mobility               General bed mobility comments: Received sitting in recliner  Transfers                 General transfer comment: Declined transfer with PT. RN reports standing and ambulating in room independently  Ambulation/Gait                Stairs            Wheelchair Mobility    Modified Rankin (Stroke Patients Only)       Balance Overall balance assessment: Needs assistance   Sitting balance-Leahy Scale: Good         Standing balance comment: NT                             Pertinent  Vitals/Pain Pain Assessment: No/denies pain    Home Living Family/patient expects to be discharged to:: Private residence Living Arrangements: Children Available Help at Discharge: Family;Available PRN/intermittently Type of Home: Apartment Home Access: Stairs to enter Entrance Stairs-Rails: Left Entrance Stairs-Number of Steps: 2 Home Layout: One level Home Equipment: Walker - 2 wheels;Shower seat;Wheelchair - manual Additional Comments: Pt lives with 62 y.o. son. Sister has been coming as needed to assist    Prior Function Level of Independence: Independent with assistive device(s)         Comments: Pt reports she completed ADLs and IADLs mod I.  She reports when she fatigues she will sit in her w/c and self propel around her apt.  She drives, sometimes grocery shops using a scooter, and mother assists at times.   Pt uses 2-4L supplemental 02 at home      Hand Dominance   Dominant Hand: Right    Extremity/Trunk Assessment   Upper Extremity Assessment Upper Extremity Assessment: Generalized weakness    Lower Extremity Assessment Lower Extremity Assessment: Generalized weakness       Communication   Communication: No difficulties  Cognition Arousal/Alertness: Awake/alert Behavior During Therapy: WFL for tasks assessed/performed Overall Cognitive Status: Within Functional Limits for tasks assessed  General Comments: WFL for simple tasks; not formally assessed      General Comments General comments (skin integrity, edema, etc.): SpO2 >/90% on 3-4L [place]    Exercises Other Exercises Other Exercises: Got pt incentive spirometer, performed 10x with pull <535mL Other Exercises: Pt declining therex - educ on repeated sit<>stands, marching in place, seated/supine LE therex (SLR, LAQ, knee/hip flexion), some sort of movement every waking hour, ideally out of bed/chair - reports understanding   Assessment/Plan    PT  Assessment Patent does not need any further PT services  PT Problem List         PT Treatment Interventions      PT Goals (Current goals can be found in the Care Plan section)  Acute Rehab PT Goals PT Goal Formulation: All assessment and education complete, DC therapy    Frequency     Barriers to discharge        Co-evaluation               AM-PAC PT "6 Clicks" Mobility  Outcome Measure Help needed turning from your back to your side while in a flat bed without using bedrails?: None Help needed moving from lying on your back to sitting on the side of a flat bed without using bedrails?: None Help needed moving to and from a bed to a chair (including a wheelchair)?: None Help needed standing up from a chair using your arms (e.g., wheelchair or bedside chair)?: None Help needed to walk in hospital room?: None Help needed climbing 3-5 steps with a railing? : A Little 6 Click Score: 23    End of Session Equipment Utilized During Treatment: Oxygen Activity Tolerance: Patient tolerated treatment well;Patient limited by fatigue Patient left: in chair;with call bell/phone within reach Nurse Communication: Mobility status PT Visit Diagnosis: Other abnormalities of gait and mobility (R26.89)    Time: 5409-8119 PT Time Calculation (min) (ACUTE ONLY): 11 min   Charges:   PT Evaluation $PT Eval Moderate Complexity: 1 Mod     Mabeline Caras, PT, DPT Acute Rehabilitation Services  Pager [phone] Office [place] 11/12/2019, 5:19 PM

## 2019-11-12 NOTE — Plan of Care (Signed)
  Problem: Coping: Goal: Psychosocial and spiritual needs will be supported Outcome: Progressing   Problem: Respiratory: Goal: Will maintain a patent airway Outcome: Progressing   

## 2019-11-12 NOTE — Progress Notes (Signed)
Not an acute change, will continue to monitor.

## 2019-11-12 NOTE — Care Management (Signed)
Patient dc'd on 1/3 w [facility] for infusions and [facility] (set up by [facility]). At that time, her sister was assisting with infusions not being done by [facility] RN.  CM alerted [facility] for possible DC on IV Abx again, will continue to follow.

## 2019-11-12 NOTE — Plan of Care (Signed)
  Problem: Education: Goal: Knowledge of risk factors and measures for prevention of condition will improve Outcome: Not Progressing   Problem: Coping: Goal: Psychosocial and spiritual needs will be supported Outcome: Not Progressing   Problem: Respiratory: Goal: Will maintain a patent airway Outcome: Not Progressing Goal: Complications related to the disease process, condition or treatment will be avoided or minimized Outcome: Not Progressing   

## 2019-11-12 NOTE — Progress Notes (Addendum)
Family Medicine Teaching Service Daily Progress Note Intern Pager: [phone]  Patient name: Makayla Vasquez Medical record number: [id number] Date of birth: [birth year] Age: 39 y.o. Gender: female  Primary Care Provider: Nuala Alpha, DO Consultants: None  Code Status: Full Code   Pt Overview and Major Events to Date:  1/21- admitted, started Vanc and cefepime   Assessment and Plan: Makayla Vasquez is a 39 y.o. female presenting with general malaise,fever, N/V, decreased PO intake in setting of recent +COVID and MSSA admission with discharge on 1/14. 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   Generalized malaise w/ N/V, Concern for  infection with indwelling PICC line Patient reports that she feels improved from when she first presented to the emergency department.  Patient denies any nausea or emesis overnight.  Patient denies any subjective fevers or chills throughout the night. WBC count without leukocytosis, patient afebrile overnight, blood cultures with no growth to date.  Patient's disease consulted and recommended discontinuing vancomycin and continue cefepime and rifampin.  RVP panel negative. -discontinue MIVFs  -continue cefepime & rifampin  -infectious disease following, will appreciate recs -Continue to f/u blood cultures, no growth to date   -continue stress dose of 40mg  methylprednisolone   Anemia Patient's hemoglobin has decreased from 9.1 on admission to 7.2 today.  Hemoglobin transfusion threshold is less than 7. Per chart review, it appears that patient has not had a transfusion since 2017 with threshold of 7. Patient denies dizziness or feeling lightheaded. No SOB.   -We will monitor hemoglobin, with check this afternoon and in the AM -Plan to transfuse if drops less than 7  COVID+  Patient is on day 21 of illness (first positive test on 1/3). Patient continues to report only a cough for respiratory symptoms, denies  worsening SOB.  Exam, still with decreased breath sounds at bases but no crackles or wheezing. Patient does sound as if airways are restricted. On [place] 3.5 liters.  -Patient started on stress dose of steroid, 40 mg methylprednisolone on 1/22, will continue for course of hospitalization.   -consider increasing home pred from 10 to 15  -Robitussin DM.  -continue airborne and contact precautions  -Continue supplemental oxygen as needed, goal sats greater than 88% -tessalon perles 100mg    Transient MSSA bacteremia: Stable.  During recent admission, BC returned with MSSA. She had a TEE without evidence of valvular vegetations. Started on antibiotics and ultimately home regimen chosen to be Cefazolin and Rifampin (patient has a pacemaker), to continue through 11/22/2019.  -Cont Rifampin (may need to see if alternative due to likely SE -Hold cefazolin as received Vanc/cefepime, stopped Vanc and now on Cefepime and Rifampin -will resume ancef and rifampin on dc  -F/u BC, no growth at 24 hrs  -infectious disease consulted, will appreciate recs   Hypokalemia, resolved Patient hypokalemic to 3.4> 4.5 and within normal limits today.  -We will monitor potassium with BMP and replenish as necessary -Patient received magnesium for low level yesterday  Elevated creatinine, improved Patient's cr trend 1.03> 0.91>down to 0.79 and within normal limits. UOP 250ML in last 24, patient 5 liters positive since admission.  -dc fluid -will monitor volume status given patient's heart failure   Lupus  Anti-SynthetaseSyndrome(anti-Jo-1antibody, interstitial lung disease)  Patient is followed by Tonna [facility] at [facility].Home medications include CellCept, tacrolimus, prednisone 10 mg.  -Holding CellCept and tacrolimus at this time per recommendations of her rheumatologist -continue stress dose steroids   Interstitial lung  disease Followed by Dr. Jetty Duhamel with [facility] pulmonology. Former  smoker. Home medications include albuterol 2 puffs every 4 hours as needed, prednisone 10 mg daily, Breo Ellipta, cromolyn (however stated reported as not taking this). Patient is also on 2-4 L of oxygen via nasal cannula at baseline. Lung exam noted to have decreased breath sounds bilaterally without crackles no wheezing, patient is tachypneic with respiratory rate 26. Patient currently on 3.5 liters via [place].  -continue home inhalers breo and albuterol -continue stress dose  steroids   Hypertension, intermittently hypotensive BP intermittently hypotensive throughout this admission.  Overnight systolic blood pressure ranged from 89-107/63-83.  Home meds include torsemide 20mg  and bisprolol.  Blood pressure 97/83 this AM.   -Continue to hold home torsemide and bisprolol  -Continue to monitor blood pressure with vitals every 2 hours  Diabetes Home meds include lantus 15 units daily and metformin 1000mg . Her lantus was decreased on discharge 1/14 and she actually has not be taking at home due to normoglycemia and poor appetite.   CBGs overnight ranged from 93-132 -will hold metformin  -sSSI to start when patient BG increase   Congestive heart failure reduced ejection fraction 35-40% Home meds include bisoprolol, lasix 20. Will need to monitor weights and I/o as we are giving patient MIVF. Urine output 250 overnight, patient is +5 L since admission.  Follows with Dr. 2/14 as outpatient for cardiology. Telemetry notes ventricular paced.  BNP is not elevated at 71.1 so low suspicion for acute HF exacerbation.  -Home meds include bisoprolol 5mg  and torsemide 20mg  but given lower BPs will continue to hold -patient monitored telemetry during this admission -strict I/Os  -daily weights    Congenital AV block s/p pacemaker Congenital heart block underwent first PPM at age 63 (mother had SLE). 2011 had echo with EF 35 to 40%. The dysfunction was thought to be due to RV pacing so upgraded to  CRT-P. Patient follows with Dr. in EP. Last battery change was in 2004, according to notes patient is due for a battery change.   Constipation:  Patient initially had not had a bowel movement in over 1 week.  She was able to have a bowel movement overnight. Denies any melena or hematochezia.  -Continue Miralax BID  - continue senna nightly   Insomnia Patient on home trazodone for sleep 50mg   -will hold while patient is admitted   HLD Last lipid profile was in 10/2019: Total cholesterol 156 , HDL 26 , LDL 108, triglycerides 260. Patient is not on a statin due to intolerance, as well as antisynthetase syndrome due to concerns of elevating CK. -have patient follow up for therapy as outpatient   Allergic Rhinitis  Continue home claritin.   GERD Home meds include omeprazole 20 -continue protonix 40 mg  FEN/GI: heart healthy diet   Prophylaxis: Lovenox   Disposition: discharge with PICC line  Anticipated on 1/24 as long as cultures remain negative and pt is aferile   Subjective:  Patient denies any further nausea or vomiting overnight.  Denies any abdominal pain.  Patient states that her breathing feels about the same and she continues to have a cough.  Denies any headache.  Objective: Temp:  [97.3 F (36.3 C)-98 F (36.7 C)] 97.9 F (36.6 C) (01/23 1330) Pulse Rate:  [81-104] 99 (01/23 1231) Resp:  [18-33] 33 (01/23 1231) BP: (89-107)/(59-83) 101/59 (01/23 1208) SpO2:  [92 %-98 %] 96 % (01/23 1231)  Physical Exam: General: Female chronically ill-appearing, sitting up in  bed in no acute distress having breakfast Cardiovascular: Hyperdynamic heart sounds consistent with pacemaker, bilateral radial pulses palpable Respiratory: Decreased breath sounds, restricted aeration, patient on nasal cannula 3.5 L, no retractions noted, no apparent increased work of breathing Abdomen: Soft and nontender to palpation, occasional bowel sounds appreciated Extremities: No lower  extremity edema  Laboratory: Recent Labs  Lab 11/10/19 1540 11/11/19 0600 11/12/19 0416  WBC 6.4 5.1 5.1  HGB 9.1* 7.4* 7.2*  HCT 33.2* 26.7* 26.6*  PLT 298 249 271   Recent Labs  Lab 11/10/19 1540 11/11/19 0600 11/12/19 0416  NA 134* 135 134*  K 3.6 3.4* 4.5  CL 93* 102 105  CO2 29 24 21*  BUN 14 14 12   CREATININE 1.03* 0.91 0.79  CALCIUM 8.0* 7.3* 7.6*  PROT 6.8 5.8*  --   BILITOT 0.7 0.9  --   ALKPHOS 64 49  --   ALT 6 7  --   AST 34 28  --   GLUCOSE 103* 86 94    Imaging/Diagnostic Tests: No new imaging  , MD 11/12/2019, 1:38 PM PGY-1, [facility] Family Medicine FPTS Intern pager: [phone], text pages welcome

## 2019-11-12 NOTE — Progress Notes (Signed)
[facility] for Infectious Disease   Reason for visit: Follow up on fever  Interval History: she remains afebrile, WBC remains wnl.  No new concerns. No sob, no chills.    blood cultures remain negative.   Physical Exam: Constitutional:  Vitals:   11/12/19 1231 11/12/19 1330  BP:    Pulse: 99   Resp: (!) 33   Temp:  97.9 F (36.6 C)  SpO2: 96%    patient appears in NAD Eyes: anicteric Respiratory: Normal respiratory effort; CTA B Cardiovascular: RRR GI: soft, nt, nd  Review of Systems: Constitutional: negative for fevers, chills and malaise Gastrointestinal: negative for diarrhea  Lab Results  Component Value Date   WBC 5.1 11/12/2019   HGB 7.2 (L) 11/12/2019   HCT 26.6 (L) 11/12/2019   MCV 79.6 (L) 11/12/2019   PLT 271 11/12/2019    Lab Results  Component Value Date   CREATININE 0.79 11/12/2019   BUN 12 11/12/2019   NA 134 (L) 11/12/2019   K 4.5 11/12/2019   CL 105 11/12/2019   CO2 21 (L) 11/12/2019    Lab Results  Component Value Date   ALT 7 11/11/2019   AST 28 11/11/2019   ALKPHOS 49 11/11/2019     Microbiology: Recent Results (from the past 240 hour(s))  Blood Culture (routine x 2)     Status: None (Preliminary result)   Collection Time: 11/10/19  3:42 PM   Specimen: BLOOD LEFT ARM  Result Value Ref Range Status   Specimen Description BLOOD LEFT ARM  Final   Special Requests   Final    BOTTLES DRAWN AEROBIC AND ANAEROBIC Blood Culture adequate volume   Culture   Final    NO GROWTH 2 DAYS Performed at [facility], [address], [place], [place] [address]    Report Status PENDING  Incomplete  SARS CORONAVIRUS 2 (TAT 6-24 HRS) Nasopharyngeal Nasopharyngeal Swab     Status: Abnormal   Collection Time: 11/10/19  3:55 PM   Specimen: Nasopharyngeal Swab  Result Value Ref Range Status   SARS Coronavirus 2 POSITIVE (A) NEGATIVE Final    Comment: RESULT CALLED TO, READ BACK BY AND VERIFIED WITH: Assunta Found, RN AT 2158 ON 11/10/2019 BY  SAINVILUS S (NOTE) SARS-CoV-2 target nucleic acids are DETECTED. The SARS-CoV-2 RNA is generally detectable in upper and lower respiratory specimens during the acute phase of infection. Positive results are indicative of the presence of SARS-CoV-2 RNA. Clinical correlation with patient history and other diagnostic information is  necessary to determine patient infection status. Positive results do not rule out bacterial infection or co-infection with other viruses.  The expected result is Negative. Fact Sheet for Patients: SugarRoll.be Fact Sheet for Healthcare Providers: https://www.woods-mathews.com/ This test is not yet approved or cleared by the [place] FDA and  has been authorized for detection and/or diagnosis of SARS-CoV-2 by FDA under an Emergency Use Authorization (EUA). This EUA will remain  in effect (meaning this test can be  used) for the duration of the COVID-19 declaration under Section 564(b)(1) of the Act, 21 U.S.C. section 360bbb-3(b)(1), unless the authorization is terminated or revoked sooner. Performed at [facility], [place] [address], [place], [place] [address]   Blood Culture (routine x 2)     Status: None (Preliminary result)   Collection Time: 11/10/19  3:55 PM   Specimen: BLOOD  Result Value Ref Range Status   Specimen Description BLOOD SITE NOT SPECIFIED  Final   Special  Requests   Final    BOTTLES DRAWN AEROBIC ONLY Blood Culture adequate volume   Culture   Final    NO GROWTH 2 DAYS Performed at [facility], [address] [place], [place] [address]    Report Status PENDING  Incomplete  Respiratory Panel by PCR     Status: None   Collection Time: 11/11/19  8:09 AM   Specimen: Nasopharyngeal Swab; Respiratory  Result Value Ref Range Status   Adenovirus NOT DETECTED NOT DETECTED Final   Coronavirus 229E NOT DETECTED NOT DETECTED Final    Comment: (NOTE) The Coronavirus on the Respiratory  Panel, DOES NOT test for the novel  Coronavirus (2019 nCoV)    Coronavirus HKU1 NOT DETECTED NOT DETECTED Final   Coronavirus NL63 NOT DETECTED NOT DETECTED Final   Coronavirus OC43 NOT DETECTED NOT DETECTED Final   Metapneumovirus NOT DETECTED NOT DETECTED Final   Rhinovirus / Enterovirus NOT DETECTED NOT DETECTED Final   Influenza A NOT DETECTED NOT DETECTED Final   Influenza B NOT DETECTED NOT DETECTED Final   Parainfluenza Virus 1 NOT DETECTED NOT DETECTED Final   Parainfluenza Virus 2 NOT DETECTED NOT DETECTED Final   Parainfluenza Virus 3 NOT DETECTED NOT DETECTED Final   Parainfluenza Virus 4 NOT DETECTED NOT DETECTED Final   Respiratory Syncytial Virus NOT DETECTED NOT DETECTED Final   Bordetella pertussis NOT DETECTED NOT DETECTED Final   Chlamydophila pneumoniae NOT DETECTED NOT DETECTED Final   Mycoplasma pneumoniae NOT DETECTED NOT DETECTED Final    Comment: Performed at [facility], [address] [place], [place] [address]    Impression/Plan:  1. MSSA bacteremia - negative TEE and getting 4 weeks of IV cefazolin + rifampin.  If her blood cultures remain negative tomorrow, can change the cefepime back to this to complete the same course.  She has follow up with [place] already  2. Fever - has remained afebrile.   3.  Access - ok for picc line tomorrow if blood cultures remain negative and ok for discharge from ID standpoint tomorrow.

## 2019-11-13 ENCOUNTER — Inpatient Hospital Stay: Payer: Self-pay

## 2019-11-13 LAB — CBC WITH DIFFERENTIAL/PLATELET
Abs Immature Granulocytes: 0.02 10*3/uL (ref 0.00–0.07)
Basophils Absolute: 0 10*3/uL (ref 0.0–0.1)
Basophils Relative: 1 %
Eosinophils Absolute: 0.4 10*3/uL (ref 0.0–0.5)
Eosinophils Relative: 9 %
HCT: 26.6 % — ABNORMAL LOW (ref 36.0–46.0)
Hemoglobin: 7.3 g/dL — ABNORMAL LOW (ref 12.0–15.0)
Immature Granulocytes: 0 %
Lymphocytes Relative: 24 %
Lymphs Abs: 1.1 10*3/uL (ref 0.7–4.0)
MCH: 21.7 pg — ABNORMAL LOW (ref 26.0–34.0)
MCHC: 27.4 g/dL — ABNORMAL LOW (ref 30.0–36.0)
MCV: 78.9 fL — ABNORMAL LOW (ref 80.0–100.0)
Monocytes Absolute: 0.5 10*3/uL (ref 0.1–1.0)
Monocytes Relative: 10 %
Neutro Abs: 2.6 10*3/uL (ref 1.7–7.7)
Neutrophils Relative %: 56 %
Platelets: 272 10*3/uL (ref 150–400)
RBC: 3.37 MIL/uL — ABNORMAL LOW (ref 3.87–5.11)
RDW: 19.9 % — ABNORMAL HIGH (ref 11.5–15.5)
WBC: 4.6 10*3/uL (ref 4.0–10.5)
nRBC: 0 % (ref 0.0–0.2)

## 2019-11-13 LAB — BASIC METABOLIC PANEL
Anion gap: 9 (ref 5–15)
BUN: 11 mg/dL (ref 6–20)
CO2: 20 mmol/L — ABNORMAL LOW (ref 22–32)
Calcium: 7.6 mg/dL — ABNORMAL LOW (ref 8.9–10.3)
Chloride: 108 mmol/L (ref 98–111)
Creatinine, Ser: 0.77 mg/dL (ref 0.44–1.00)
GFR calc Af Amer: >60 mL/min (ref >60)
GFR calc non Af Amer: >60 mL/min (ref >60)
Glucose, Bld: 164 mg/dL — ABNORMAL HIGH (ref 70–99)
Potassium: 4.2 mmol/L (ref 3.5–5.1)
Sodium: 137 mmol/L (ref 135–145)

## 2019-11-13 LAB — GLUCOSE, CAPILLARY
Glucose-Capillary: 95 mg/dL (ref 70–99)
Glucose-Capillary: 181 mg/dL — ABNORMAL HIGH (ref 70–99)
Glucose-Capillary: 119 mg/dL — ABNORMAL HIGH (ref 70–99)

## 2019-11-13 LAB — MAGNESIUM: Magnesium: 1.7 mg/dL (ref 1.7–2.4)

## 2019-11-13 MED ORDER — BISOPROLOL FUMARATE 5 MG PO TABS: 2.5000 mg | ORAL_TABLET | Freq: Every day | ORAL | 1 refills | Status: AC

## 2019-11-13 MED ORDER — BISOPROLOL FUMARATE 5 MG PO TABS
2.5000 mg | ORAL_TABLET | Freq: Every day | ORAL | Status: DC
  Filled 2019-11-13: qty 0.5

## 2019-11-13 MED ORDER — CEFAZOLIN SODIUM-DEXTROSE 2-4 GM/100ML-% IV SOLN
2.0000 g | Freq: Three times a day (TID) | INTRAVENOUS | Status: DC
  Administered 2019-11-13: 17:00:00 2 g via INTRAVENOUS
  Filled 2019-11-13 (×4): qty 100

## 2019-11-13 MED ORDER — CEFAZOLIN IV (FOR PTA / DISCHARGE USE ONLY): 2.0000 g | Freq: Three times a day (TID) | INTRAVENOUS | 0 refills | Status: AC

## 2019-11-13 MED ORDER — HEPARIN SOD (PORK) LOCK FLUSH 100 UNIT/ML IV SOLN
250.0000 [IU] | INTRAVENOUS | Status: AC | PRN
  Administered 2019-11-13: 18:00:00 250 [IU]
  Filled 2019-11-13: qty 2.5

## 2019-11-13 MED ORDER — SODIUM CHLORIDE 0.9% FLUSH: 10.0000 mL | Freq: Two times a day (BID) | INTRAVENOUS | Status: DC

## 2019-11-13 MED ORDER — SODIUM CHLORIDE 0.9% FLUSH: 10.0000 mL | INTRAVENOUS | Status: DC | PRN

## 2019-11-13 MED ORDER — PREDNISONE 20 MG PO TABS: ORAL_TABLET | ORAL | 0 refills | Status: AC

## 2019-11-13 NOTE — Progress Notes (Signed)
Patient maintain O2 sat of 90% and above while transferring to the [facility] on 4L [place].

## 2019-11-13 NOTE — Progress Notes (Signed)
MEWs score is yellow due to low B/P. The patient has had a low blood pressure for several days and the MD is aware. This is not an acute change.

## 2019-11-13 NOTE — Progress Notes (Signed)
Family Medicine Teaching Service Daily Progress Note Intern Pager: [phone]  Patient name: Makayla Vasquez Medical record number: [id number] Date of birth: 1981/09/04 Age: 39 y.o. Gender: female  Primary Care Provider: Nuala Alpha, DO Consultants: None  Code Status: Full Code   Pt Overview and Major Events to Date:  1/21- admitted, started Vanc and cefepime   Assessment and Plan: Makayla Vasquez is a 39 y.o. female presenting with general malaise,fever, N/V, decreased PO intake in setting of recent +COVID and MSSA admission with discharge on 1/14. 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   Generalized malaise w/ N/V, Concern for  infection with indwelling PICC line,  Overall remains afebrile for the past 48 hours.  Blood cultures are -48 hours.  Per ID recommendations okay to start PICC, discharge home with IV Ancef and rifampin until second.   -PICC ordered, awaiting placement -DC cefepime, restart Ancef and continue rifampin  Questionable adrenal insufficiency Blood pressures remain low normotensive.  They do seem to have improved slightly after receiving stress dose steroids.  Recommending steroid taper and outpatient rheumatology follow-up. -Taper corticosteroids over 1 week to baseline 10 mg prednisone - recommend follow-up with rheumatology to assess ongoing immunosuppressive needs  Anemia of chronic disease, stable Hemoglobin baseline seems to be around 8 to 9.  Was 8.0 on discharge on 1/13.  Upon admission was initially at 9.1, but then down trended to 7.4 has remained stable around this level with 7.3 today.  No signs or symptoms of active bleeding. -Recommend repeat CBC in the outpatient setting -Consider GI follow-up if remains depressed   COVID+  Patient is on day 21 of illness (first positive test on 1/3). Patient continues to report only a cough for respiratory symptoms, denies worsening SOB.  Exam, still with decreased  breath sounds at bases but no crackles or wheezing. Patient does sound as if airways are restricted. On [place] 3.5 liters.  -Patient started on stress dose of steroid, 40 mg methylprednisolone on 1/22, will continue for course of hospitalization.   -consider increasing home pred from 10 to 15  -Robitussin DM.  -continue airborne and contact precautions  -Continue supplemental oxygen as needed, goal sats greater than 88% -tessalon perles 100mg    Transient MSSA bacteremia: Stable.  Please see plan as above  Lupus  Anti-SynthetaseSyndrome(anti-Jo-1antibody, interstitial lung disease)  Please see steroid plan as above  Interstitial lung disease Was desatting on 3 L nasal cannula, increased to 4 L nasal cannula.  Patient was not active, she was lying in bed. Followed by Dr. Baird Lyons with [facility] pulmonology. Former smoker. Home medications include albuterol 2 puffs every 4 hours as needed, prednisone 10 mg daily, Breo Ellipta, cromolyn (however stated reported as not taking this). Patient is also on 2-4 L of oxygen via nasal cannula at baseline. Lung exam noted to have decreased breath sounds bilaterally without crackles no wheezing, patient is tachypneic with respiratory rate 26. Patient currently on 3.5 liters via [place].  -continue home inhalers breo and albuterol -continue stress dose  steroids   Hypertension, intermittently hypotensive Remains low normotensive.  -Continue to hold home torsemide and bisprolol  -Recommend blood pressure check in outpatient setting as we will likely hold torsemide  -We will restart bisoprolol 2.5 mg  Diabetes Home meds include lantus 15 units daily and metformin 1000mg . Her lantus was decreased on discharge 1/14 and she actually has not be taking at home due to normoglycemia and poor appetite.  CBGs overnight ranged from 93-132 -will hold metformin  -sSSI to start when patient BG increase   Congestive heart failure reduced ejection fraction  35-40% Home meds include bisoprolol, lasix 20. Will need to monitor weights and I/o as we are giving patient MIVF. Urine output 250 overnight, patient is +5 L since admission.  Follows with Dr. Gala Romney as outpatient for cardiology. Telemetry notes ventricular paced.  BNP is not elevated at 71.1 so low suspicion for acute HF exacerbation.  -Home meds include bisoprolol 5mg  and torsemide 20mg  -Restart bisoprolol at lower dose of 2.5 mg -patient monitored telemetry during this admission -strict I/Os  -daily weights    Congenital AV block s/p pacemaker Congenital heart block underwent first PPM at age 5 (mother had SLE). 2011 had echo with EF 35 to 40%. The dysfunction was thought to be due to RV pacing so upgraded to CRT-P. Patient follows with Dr. 12 in EP. Last battery change was in 2004, according to notes patient is due for a battery change.   Constipation:  Patient initially had not had a bowel movement in over 1 week.  She was able to have a bowel movement overnight. Denies any melena or hematochezia.  -Continue Miralax BID  - continue senna nightly   Insomnia Patient on home trazodone for sleep 50mg   -will hold while patient is admitted   HLD Last lipid profile was in 10/2019: Total cholesterol 156 , HDL 26 , LDL 108, triglycerides 260. Patient is not on a statin due to intolerance, as well as antisynthetase syndrome due to concerns of elevating CK. -have patient follow up for therapy as outpatient   Allergic Rhinitis  Continue home claritin.   GERD Home meds include omeprazole 20 -continue protonix 40 mg  FEN/GI: heart healthy diet   Prophylaxis: Lovenox   Disposition: discharge with PICC line  Anticipated on 1/24 as long as cultures remain negative and pt is aferile   Subjective:  Patient doing well.  No complaints.  Did have increased oxygen a little bit as patient was desatting on 3 L, increased to 4 L.  This is still within her home  regimen.  Objective: Temp:  [97.4 F (36.3 C)-98.1 F (36.7 C)] 98.1 F (36.7 C) (01/24 0528) Pulse Rate:  [88-104] 95 (01/24 0757) Resp:  [23-44] 24 (01/24 0757) BP: (93-104)/(57-76) 102/72 (01/24 0757) SpO2:  [89 %-98 %] 95 % (01/24 0757)  Physical Exam: General: Female chronically ill-appearing, sitting up in bed in no acute distress having breakfast Cardiovascular: Hyperdynamic heart sounds consistent with pacemaker, bilateral radial pulses palpable Respiratory: Decreased breath sounds, restricted aeration, patient on nasal cannula 4L, no retractions noted, no apparent increased work of breathing Abdomen: Soft and nontender to palpation, occasional bowel sounds appreciated Extremities: No lower extremity edema  Laboratory: Recent Labs  Lab 11/12/19 0416 11/12/19 1747 11/13/19 0256  WBC 5.1 4.2 4.6  HGB 7.2* 8.4* 7.3*  HCT 26.6* 30.9* 26.6*  PLT 271 282 272   Recent Labs  Lab 11/10/19 1540 11/10/19 1540 11/11/19 0600 11/12/19 0416 11/13/19 0256  NA 134*   < > 135 134* 137  K 3.6   < > 3.4* 4.5 4.2  CL 93*   < > 102 105 108  CO2 29   < > 24 21* 20*  BUN 14   < > 14 12 11   CREATININE 1.03*   < > 0.91 0.79 0.77  CALCIUM 8.0*   < > 7.3* 7.6* 7.6*  PROT 6.8  --  5.8*  --   --  BILITOT 0.7  --  0.9  --   --   ALKPHOS 64  --  49  --   --   ALT 6  --  7  --   --   AST 34  --  28  --   --   GLUCOSE 103*   < > 86 94 164*   < > = values in this interval not displayed.    Imaging/Diagnostic Tests: No new imaging  Garnette Gunner, MD 11/13/2019, 10:00 AM PGY-3, [facility] Family Medicine FPTS Intern pager: [phone], text pages welcome

## 2019-11-13 NOTE — Progress Notes (Signed)
Pharmacy Antibiotic Note  Makayla Vasquez is a 39 y.o. female admitted on 11/10/2019 with sepsis. Patient started on cefepime and vancomycin for concern of sepsis. Vancomycin was discontinued on 1/22 and patient continued on cefepime through today. Pharmacy now consulted to dose cefazolin for bacteremia. Give patient was recently discharged on 11/02/2019 on cefazolin 2g q8h and rifampin for transient MSSA bacteremia with an intended end date of 11/22/2019. WBC wnl. Afebrile. Renal function stable.    Plan: Start cefazolin 2g IV q8h (next dose 8 hrs after last dose of cefepime) Monitor renal function, cultures/sensitivites, and clinical progression  Height: 5\' 5"  (165.1 cm) Weight: 148 lb 5.9 oz (67.3 kg) IBW/kg (Calculated) : 57  Temp (24hrs), Avg:97.8 F (36.6 C), Min:97.4 F (36.3 C), Max:98.1 F (36.7 C)  Recent Labs  Lab 11/10/19 1540 11/10/19 1550 11/10/19 1822 11/11/19 0600 11/12/19 0416 11/12/19 1747 11/13/19 0256  WBC 6.4  --   --  5.1 5.1 4.2 4.6  CREATININE 1.03*  --   --  0.91 0.79  --  0.77  LATICACIDVEN  --  2.0* 2.0*  --   --   --   --     Estimated Creatinine Clearance: 85 mL/min (by C-G formula based on SCr of 0.77 mg/dL).    Allergies  Allergen Reactions  . 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 Sertraline Hcl Hives  . Tape Other (See Comments)    "Burns" the skin   Antibiotics this admission: Vancomycin 1/21 >> Cefepime 1/21 >> 1/24  Cefazolin 1/24 >>  Rifampin >> (2/2)  Microbiology: 1/21 COVID - positive  1/21 BCx - no growth to date  1/22 respiratory panel - negative   Thank you for allowing pharmacy to be a part of this patient's care.  2/22, PharmD PGY1 Pharmacy Resident [employer]: [phone]

## 2019-11-13 NOTE — Progress Notes (Signed)
Discharge teaching complete. Meds, diet, activity, follow up appointments and covid education reviewed and all questions answered. Copy of instructions given to patient along with prescription for Ancef. Patient says she can dress herself and will press the call button once her ride is downstairs.

## 2019-11-13 NOTE — Discharge Instructions (Signed)
You are admitted to the hospital for fever and not feeling well.  You were started on broad-spectrum antibiotics with vancomycin and cefepime.  Your blood cultures were drawn and your PIC line was removed.  Your cultures were negative.  A PICC line was replaced and we have restarted you on your antibiotics IV Ancef and rifampin.  Please continue with this.  All the DME orders for home have been resumed.  In the hospital, you received higher doses of your steroids.  We are discharging you on a taper over 1 week.  Please schedule follow-up with your rheumatologist to discuss your restarting your prednisone baseline prednisone and other immunosuppressive drugs.

## 2019-11-13 NOTE — Progress Notes (Signed)
PHARMACY CONSULT NOTE FOR:  OUTPATIENT  PARENTERAL ANTIBIOTIC THERAPY (OPAT)  Indication: MSSA bacteremia Regimen: cefazolin 2g IV q8h  End date: 11/22/2019  IV antibiotic discharge orders are pended. To discharging provider:  please sign these orders via discharge navigator,  Select New Orders & click on the button choice - Manage This Unsigned Work.     Thank you for allowing pharmacy to be a part of this patient's care.  Gerrit Halls, PharmD PGY1 Pharmacy Resident [employer]: [phone]  11/13/2019, 1:20 PM

## 2019-11-13 NOTE — Care Management (Signed)
Notified that patient will DC today. No change in IV Abx dose. Patient should have doses at home per [facility] w [facility]. I confirmed this with patient, she states she "has a lot." HH will continue through [place] as established prior to admission. Nurse to give today's last dose prior to DC.  Patient confirmed her sister can help with doses not given by [facility] RN.  Patient states she has transportation home.

## 2019-11-13 NOTE — Progress Notes (Signed)
Peripherally Inserted Central Catheter/Midline Placement  The IV Nurse has discussed with the patient and/or persons authorized to consent for the patient, the purpose of this procedure and the potential benefits and risks involved with this procedure.  The benefits include less needle sticks, lab draws from the catheter, and the patient may be discharged home with the catheter. Risks include, but not limited to, infection, bleeding, blood clot (thrombus formation), and puncture of an artery; nerve damage and irregular heartbeat and possibility to perform a PICC exchange if needed/ordered by physician.  Alternatives to this procedure were also discussed.  B[employer] Power PICC patient education guide, fact sheet on infection prevention and patient information card has been provided to patient /or left at bedside.    PICC/Midline Placement Documentation  PICC Single Lumen 11/13/19 PICC Right Basilic 37 cm 0 cm (Active)  Indication for Insertion or Continuance of Line Prolonged intravenous therapies;Home intravenous therapies (PICC only) 11/13/19 1232  Exposed Catheter (cm) 0 cm 11/13/19 1232  Site Assessment Clean;Dry;Intact 11/13/19 1232  Line Status Flushed;Saline locked;Blood return noted 11/13/19 1232  Dressing Type Transparent 11/13/19 1232  Dressing Status Clean;Dry;Intact;Antimicrobial disc in place 11/13/19 1232  Line Care Connections checked and tightened 11/13/19 1232  Line Adjustment (NICU/IV Team Only) No 11/13/19 1232  Dressing Intervention New dressing 11/13/19 1232  Dressing Change Due 11/20/19 11/13/19 1232       Elliot Dally 11/13/2019, 12:33 PM

## 2019-11-15 LAB — CULTURE, BLOOD (ROUTINE X 2)
Culture: NO GROWTH
Culture: NO GROWTH
Special Requests: ADEQUATE
Special Requests: ADEQUATE

## 2019-11-16 ENCOUNTER — Inpatient Hospital Stay (HOSPITAL_COMMUNITY): Payer: Medicare HMO

## 2019-11-16 ENCOUNTER — Telehealth: Payer: Self-pay | Admitting: Family Medicine

## 2019-11-16 ENCOUNTER — Other Ambulatory Visit: Payer: Self-pay

## 2019-11-16 ENCOUNTER — Emergency Department (HOSPITAL_COMMUNITY): Payer: Medicare HMO

## 2019-11-16 ENCOUNTER — Encounter (HOSPITAL_COMMUNITY): Payer: Self-pay | Admitting: Pulmonary Disease

## 2019-11-16 ENCOUNTER — Inpatient Hospital Stay (HOSPITAL_COMMUNITY)
Admission: EM | Admit: 2019-11-16 | Discharge: 2019-11-21 | DRG: 208 | Disposition: E | Payer: Medicare HMO | Attending: Pulmonary Disease | Admitting: Pulmonary Disease

## 2019-11-16 DIAGNOSIS — M329 Systemic lupus erythematosus, unspecified: Secondary | ICD-10-CM | POA: Diagnosis present

## 2019-11-16 DIAGNOSIS — E872 Acidosis: Secondary | ICD-10-CM | POA: Diagnosis present

## 2019-11-16 DIAGNOSIS — Z794 Long term (current) use of insulin: Secondary | ICD-10-CM

## 2019-11-16 DIAGNOSIS — M069 Rheumatoid arthritis, unspecified: Secondary | ICD-10-CM | POA: Diagnosis present

## 2019-11-16 DIAGNOSIS — B3324 Viral cardiomyopathy: Secondary | ICD-10-CM | POA: Diagnosis present

## 2019-11-16 DIAGNOSIS — I129 Hypertensive chronic kidney disease with stage 1 through stage 4 chronic kidney disease, or unspecified chronic kidney disease: Secondary | ICD-10-CM | POA: Diagnosis not present

## 2019-11-16 DIAGNOSIS — Z832 Family history of diseases of the blood and blood-forming organs and certain disorders involving the immune mechanism: Secondary | ICD-10-CM | POA: Diagnosis not present

## 2019-11-16 DIAGNOSIS — J1282 Pneumonia due to coronavirus disease 2019: Secondary | ICD-10-CM | POA: Diagnosis present

## 2019-11-16 DIAGNOSIS — J80 Acute respiratory distress syndrome: Secondary | ICD-10-CM | POA: Diagnosis present

## 2019-11-16 DIAGNOSIS — J449 Chronic obstructive pulmonary disease, unspecified: Secondary | ICD-10-CM | POA: Diagnosis present

## 2019-11-16 DIAGNOSIS — Z7952 Long term (current) use of systemic steroids: Secondary | ICD-10-CM

## 2019-11-16 DIAGNOSIS — Z7984 Long term (current) use of oral hypoglycemic drugs: Secondary | ICD-10-CM | POA: Diagnosis not present

## 2019-11-16 DIAGNOSIS — Z950 Presence of cardiac pacemaker: Secondary | ICD-10-CM | POA: Diagnosis not present

## 2019-11-16 DIAGNOSIS — Z8249 Family history of ischemic heart disease and other diseases of the circulatory system: Secondary | ICD-10-CM

## 2019-11-16 DIAGNOSIS — Q246 Congenital heart block: Secondary | ICD-10-CM

## 2019-11-16 DIAGNOSIS — D869 Sarcoidosis, unspecified: Secondary | ICD-10-CM | POA: Diagnosis not present

## 2019-11-16 DIAGNOSIS — Z9981 Dependence on supplemental oxygen: Secondary | ICD-10-CM

## 2019-11-16 DIAGNOSIS — J9601 Acute respiratory failure with hypoxia: Secondary | ICD-10-CM | POA: Diagnosis present

## 2019-11-16 DIAGNOSIS — Z7951 Long term (current) use of inhaled steroids: Secondary | ICD-10-CM

## 2019-11-16 DIAGNOSIS — R579 Shock, unspecified: Secondary | ICD-10-CM | POA: Diagnosis present

## 2019-11-16 DIAGNOSIS — U071 COVID-19: Principal | ICD-10-CM

## 2019-11-16 DIAGNOSIS — Z888 Allergy status to other drugs, medicaments and biological substances status: Secondary | ICD-10-CM

## 2019-11-16 DIAGNOSIS — R7881 Bacteremia: Secondary | ICD-10-CM | POA: Diagnosis present

## 2019-11-16 DIAGNOSIS — E1122 Type 2 diabetes mellitus with diabetic chronic kidney disease: Secondary | ICD-10-CM | POA: Diagnosis not present

## 2019-11-16 DIAGNOSIS — F319 Bipolar disorder, unspecified: Secondary | ICD-10-CM | POA: Diagnosis present

## 2019-11-16 DIAGNOSIS — E1169 Type 2 diabetes mellitus with other specified complication: Secondary | ICD-10-CM | POA: Diagnosis present

## 2019-11-16 DIAGNOSIS — I5023 Acute on chronic systolic (congestive) heart failure: Secondary | ICD-10-CM | POA: Diagnosis present

## 2019-11-16 DIAGNOSIS — E785 Hyperlipidemia, unspecified: Secondary | ICD-10-CM | POA: Diagnosis present

## 2019-11-16 DIAGNOSIS — B9561 Methicillin susceptible Staphylococcus aureus infection as the cause of diseases classified elsewhere: Secondary | ICD-10-CM | POA: Diagnosis not present

## 2019-11-16 DIAGNOSIS — K219 Gastro-esophageal reflux disease without esophagitis: Secondary | ICD-10-CM | POA: Diagnosis present

## 2019-11-16 DIAGNOSIS — I2781 Cor pulmonale (chronic): Secondary | ICD-10-CM | POA: Diagnosis present

## 2019-11-16 DIAGNOSIS — I469 Cardiac arrest, cause unspecified: Secondary | ICD-10-CM

## 2019-11-16 DIAGNOSIS — I4901 Ventricular fibrillation: Secondary | ICD-10-CM | POA: Diagnosis not present

## 2019-11-16 DIAGNOSIS — A419 Sepsis, unspecified organism: Secondary | ICD-10-CM | POA: Diagnosis not present

## 2019-11-16 DIAGNOSIS — R6521 Severe sepsis with septic shock: Secondary | ICD-10-CM | POA: Diagnosis not present

## 2019-11-16 DIAGNOSIS — Z91048 Other nonmedicinal substance allergy status: Secondary | ICD-10-CM | POA: Diagnosis not present

## 2019-11-16 DIAGNOSIS — Z87891 Personal history of nicotine dependence: Secondary | ICD-10-CM | POA: Diagnosis not present

## 2019-11-16 LAB — CBC WITH DIFFERENTIAL/PLATELET
Abs Immature Granulocytes: 0.05 10*3/uL (ref 0.00–0.07)
Basophils Absolute: 0.1 10*3/uL (ref 0.0–0.1)
Basophils Relative: 1 %
Eosinophils Absolute: 0.5 10*3/uL (ref 0.0–0.5)
Eosinophils Relative: 5 %
HCT: 34.6 % — ABNORMAL LOW (ref 36.0–46.0)
Hemoglobin: 9.3 g/dL — ABNORMAL LOW (ref 12.0–15.0)
Immature Granulocytes: 1 %
Lymphocytes Relative: 40 %
Lymphs Abs: 3.8 10*3/uL (ref 0.7–4.0)
MCH: 21.6 pg — ABNORMAL LOW (ref 26.0–34.0)
MCHC: 26.9 g/dL — ABNORMAL LOW (ref 30.0–36.0)
MCV: 80.5 fL (ref 80.0–100.0)
Monocytes Absolute: 1 10*3/uL (ref 0.1–1.0)
Monocytes Relative: 10 %
Neutro Abs: 4.2 10*3/uL (ref 1.7–7.7)
Neutrophils Relative %: 43 %
Platelets: 472 10*3/uL — ABNORMAL HIGH (ref 150–400)
RBC: 4.3 MIL/uL (ref 3.87–5.11)
RDW: 20.8 % — ABNORMAL HIGH (ref 11.5–15.5)
WBC: 9.6 10*3/uL (ref 4.0–10.5)
nRBC: 0.2 % (ref 0.0–0.2)

## 2019-11-16 LAB — PROTIME-INR
INR: 1.6 — ABNORMAL HIGH (ref 0.8–1.2)
Prothrombin Time: 18.9 seconds — ABNORMAL HIGH (ref 11.4–15.2)

## 2019-11-16 LAB — APTT: aPTT: 36 seconds (ref 24–36)

## 2019-11-16 LAB — URINALYSIS, ROUTINE W REFLEX MICROSCOPIC
Bilirubin Urine: NEGATIVE
Glucose, UA: NEGATIVE mg/dL
Ketones, ur: 5 mg/dL — AB
Nitrite: NEGATIVE
Protein, ur: >=300 mg/dL — AB
Specific Gravity, Urine: 1.03 (ref 1.005–1.030)
WBC, UA: >50 WBC/hpf — ABNORMAL HIGH (ref 0–5)
pH: 5 (ref 5.0–8.0)

## 2019-11-16 LAB — I-STAT BETA HCG BLOOD, ED (MC, WL, AP ONLY): I-stat hCG, quantitative: <5 m[IU]/mL (ref <5)

## 2019-11-16 LAB — COMPREHENSIVE METABOLIC PANEL
ALT: 50 U/L — ABNORMAL HIGH (ref 0–44)
AST: 194 U/L — ABNORMAL HIGH (ref 15–41)
Albumin: 3 g/dL — ABNORMAL LOW (ref 3.5–5.0)
Alkaline Phosphatase: 83 U/L (ref 38–126)
Anion gap: 14 (ref 5–15)
BUN: 15 mg/dL (ref 6–20)
CO2: 17 mmol/L — ABNORMAL LOW (ref 22–32)
Calcium: 8.2 mg/dL — ABNORMAL LOW (ref 8.9–10.3)
Chloride: 107 mmol/L (ref 98–111)
Creatinine, Ser: 1.19 mg/dL — ABNORMAL HIGH (ref 0.44–1.00)
GFR calc Af Amer: >60 mL/min (ref >60)
GFR calc non Af Amer: 57 mL/min — ABNORMAL LOW (ref >60)
Glucose, Bld: 135 mg/dL — ABNORMAL HIGH (ref 70–99)
Potassium: 4.9 mmol/L (ref 3.5–5.1)
Sodium: 138 mmol/L (ref 135–145)
Total Bilirubin: 0.9 mg/dL (ref 0.3–1.2)
Total Protein: 7.1 g/dL (ref 6.5–8.1)

## 2019-11-16 LAB — MAGNESIUM: Magnesium: 1.5 mg/dL — ABNORMAL LOW (ref 1.7–2.4)

## 2019-11-16 LAB — LACTIC ACID, PLASMA
Lactic Acid, Venous: 6.4 mmol/L (ref 0.5–1.9)
Lactic Acid, Venous: 7.4 mmol/L (ref 0.5–1.9)

## 2019-11-16 LAB — BRAIN NATRIURETIC PEPTIDE: B Natriuretic Peptide: 425.4 pg/mL — ABNORMAL HIGH (ref 0.0–100.0)

## 2019-11-16 IMAGING — DX DG KNEE COMPLETE 4+V*R*
4 series · 4 of 4 positions shown · non-contrast
Comparison: None.

CLINICAL DATA: Right knee pain for 1 week

EXAM:
RIGHT KNEE - COMPLETE 4+ VIEW

[knee ap]
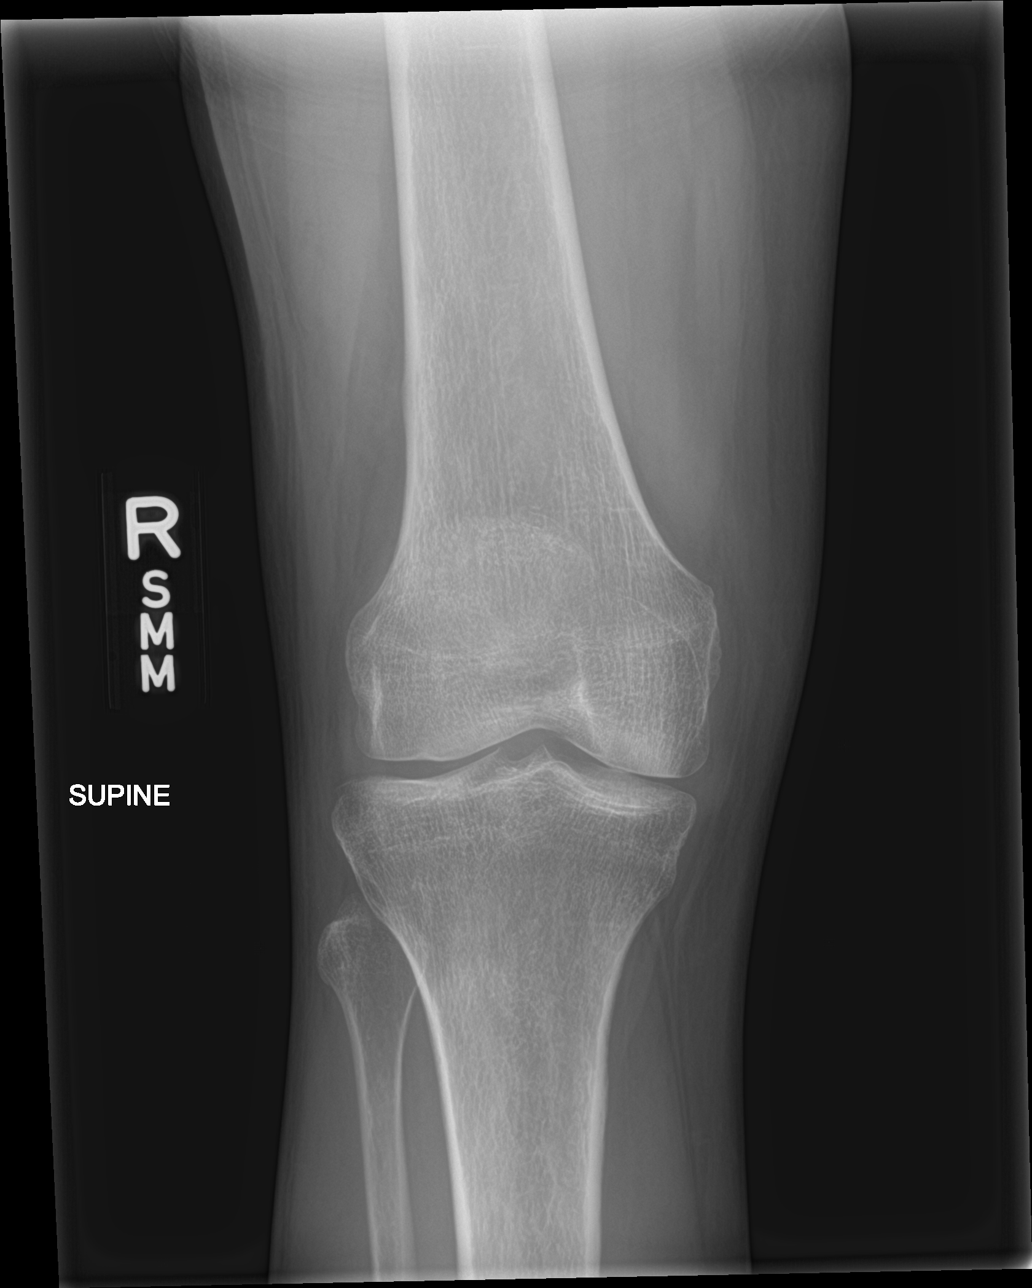

[knee obl (1 of 2)]
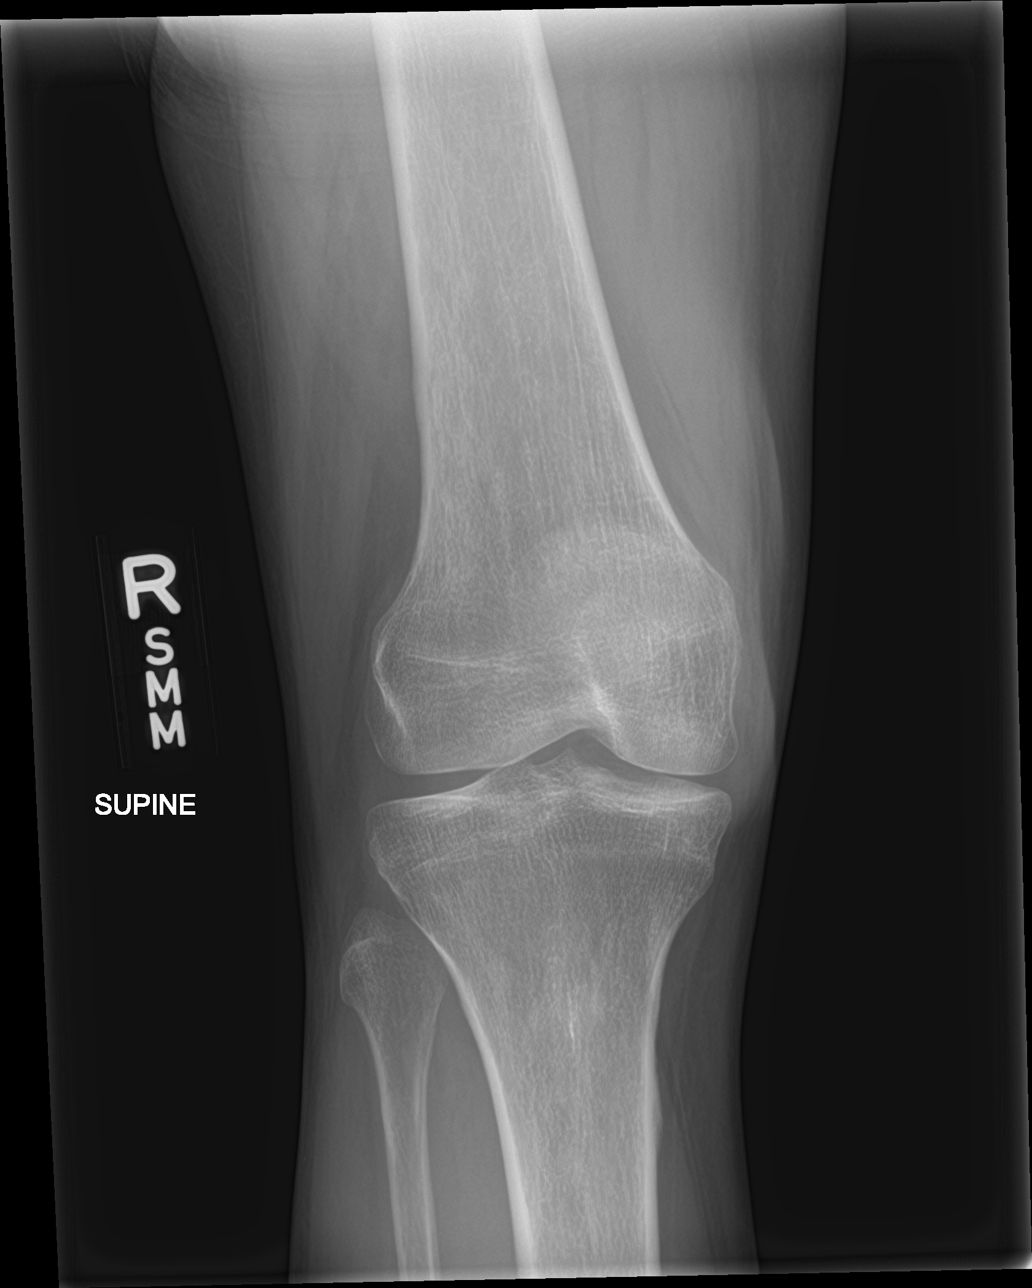

[knee obl (2 of 2)]
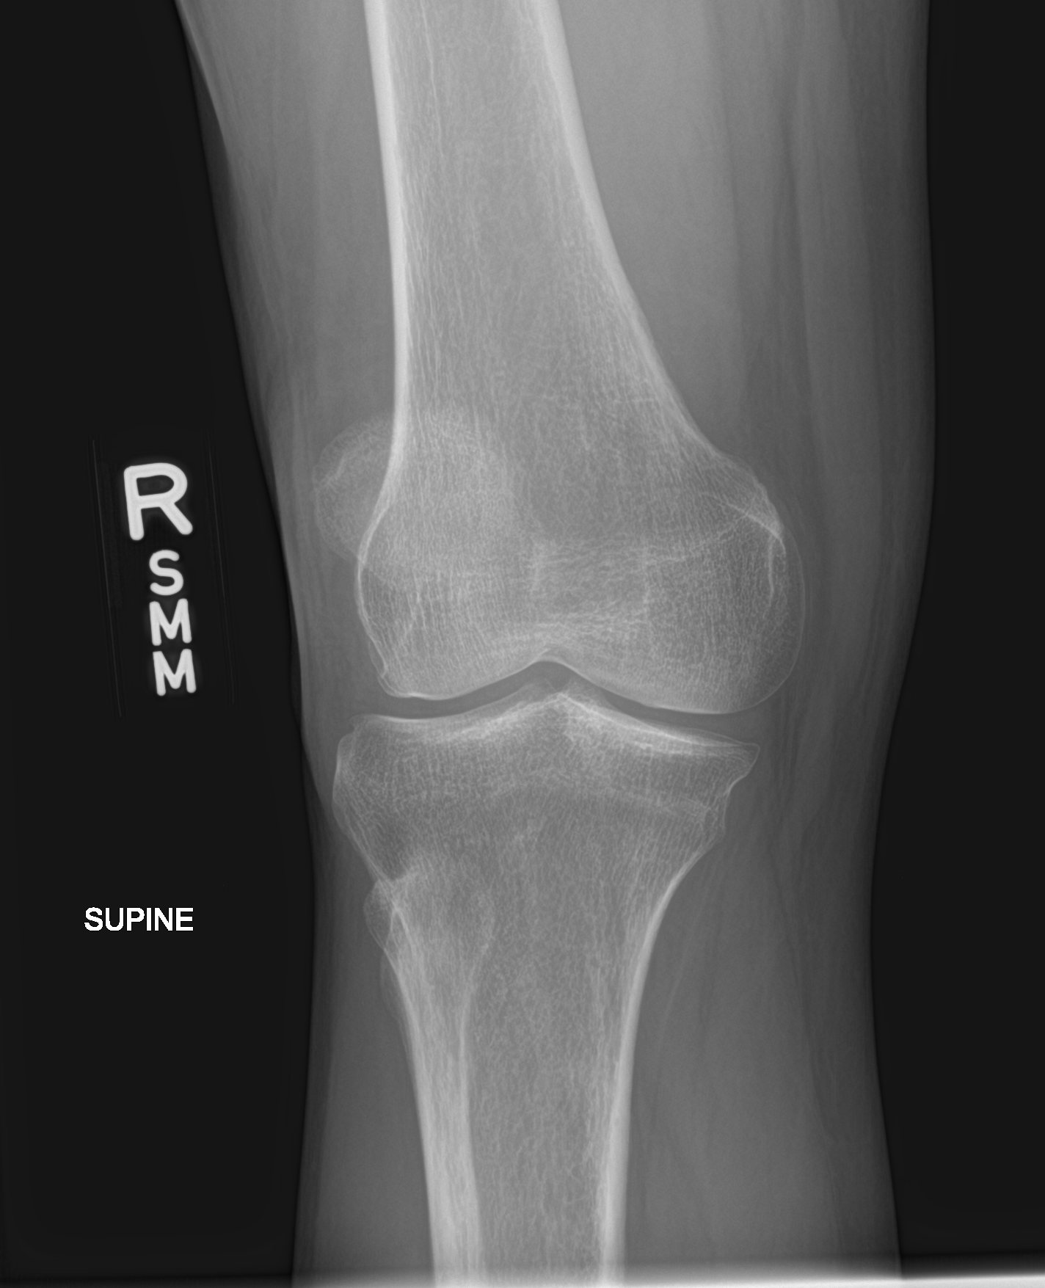

[knee lat]
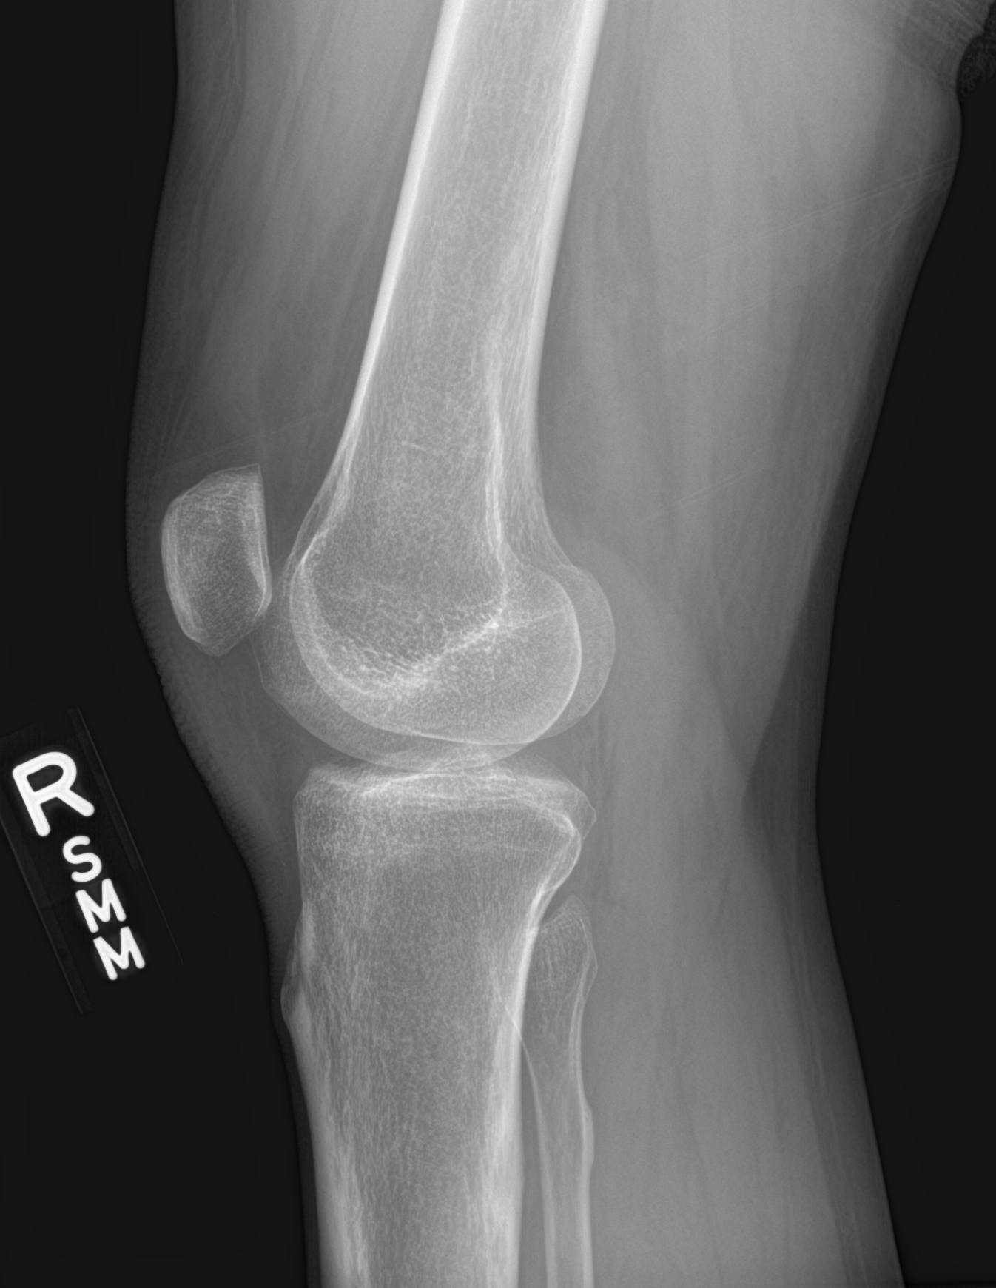

[4 of 4 positions shown; findings below may reference images not displayed]

FINDINGS: No evidence of fracture, dislocation, or joint effusion. No evidence
of arthropathy or other focal bone abnormality. Soft tissues are
unremarkable.
IMPRESSION: Negative.

## 2019-11-16 MED ORDER — SODIUM CHLORIDE 0.9 % IV BOLUS (SEPSIS)
1000.0000 mL | Freq: Once | INTRAVENOUS | Status: AC
  Administered 2019-11-16: 1000 mL via INTRAVENOUS

## 2019-11-16 MED ORDER — EPINEPHRINE 1 MG/10ML IJ SOSY
PREFILLED_SYRINGE | INTRAMUSCULAR | Status: AC | PRN
  Administered 2019-11-16 (×2): 0.1 mg via INTRAVENOUS

## 2019-11-16 MED ORDER — MIDAZOLAM HCL 2 MG/2ML IJ SOLN
5.0000 mg | Freq: Once | INTRAMUSCULAR | Status: AC
  Administered 2019-11-16: 5 mg via INTRAVENOUS
  Filled 2019-11-16: qty 6

## 2019-11-16 MED ORDER — SODIUM CHLORIDE 0.9 % IV SOLN: 2.0000 g | Freq: Two times a day (BID) | INTRAVENOUS | Status: DC

## 2019-11-16 MED ORDER — LORAZEPAM 2 MG/ML IJ SOLN
0.5000 mg | Freq: Once | INTRAMUSCULAR | Status: AC
  Administered 2019-11-16: 0.5 mg via INTRAVENOUS
  Filled 2019-11-16: qty 1

## 2019-11-16 MED ORDER — DOPAMINE-DEXTROSE 3.2-5 MG/ML-% IV SOLN
INTRAVENOUS | Status: AC
  Filled 2019-11-16: qty 250

## 2019-11-16 MED ORDER — METRONIDAZOLE IN NACL 5-0.79 MG/ML-% IV SOLN
500.0000 mg | Freq: Once | INTRAVENOUS | Status: AC
  Administered 2019-11-16: 500 mg via INTRAVENOUS
  Filled 2019-11-16: qty 100

## 2019-11-16 MED ORDER — VANCOMYCIN HCL 1250 MG/250ML IV SOLN: 1250.0000 mg | INTRAVENOUS | Status: DC

## 2019-11-16 MED ORDER — HEPARIN SODIUM (PORCINE) 5000 UNIT/ML IJ SOLN: 5000.0000 [IU] | Freq: Three times a day (TID) | INTRAMUSCULAR | Status: DC

## 2019-11-16 MED ORDER — EPINEPHRINE 1 MG/10ML IJ SOSY
PREFILLED_SYRINGE | INTRAMUSCULAR | Status: AC | PRN
  Administered 2019-11-16 (×4): 0.1 mg via INTRAVENOUS

## 2019-11-16 MED ORDER — PANTOPRAZOLE SODIUM 40 MG IV SOLR: 40.0000 mg | INTRAVENOUS | Status: DC

## 2019-11-16 MED ORDER — SUCCINYLCHOLINE CHLORIDE 20 MG/ML IJ SOLN
INTRAMUSCULAR | Status: AC | PRN
  Administered 2019-11-16: 100 mg via INTRAVENOUS

## 2019-11-16 MED ORDER — VANCOMYCIN HCL IN DEXTROSE 1-5 GM/200ML-% IV SOLN
1000.0000 mg | Freq: Once | INTRAVENOUS | Status: AC
  Administered 2019-11-16: 1000 mg via INTRAVENOUS
  Filled 2019-11-16: qty 200

## 2019-11-16 MED ORDER — NOREPINEPHRINE 4 MG/250ML-% IV SOLN
0.0000 ug/min | INTRAVENOUS | Status: DC
  Administered 2019-11-16: 6 ug/min via INTRAVENOUS
  Filled 2019-11-16: qty 250

## 2019-11-16 MED ORDER — SODIUM BICARBONATE 8.4 % IV SOLN
INTRAVENOUS | Status: AC | PRN
  Administered 2019-11-16: 50 meq via INTRAVENOUS

## 2019-11-16 MED ORDER — FENTANYL CITRATE (PF) 100 MCG/2ML IJ SOLN
100.0000 ug | Freq: Once | INTRAMUSCULAR | Status: AC
  Administered 2019-11-16: 100 ug via INTRAVENOUS
  Filled 2019-11-16: qty 2

## 2019-11-16 MED ORDER — CALCIUM CHLORIDE 10 % IV SOLN
INTRAVENOUS | Status: AC | PRN
  Administered 2019-11-16: 1 g via INTRAVENOUS

## 2019-11-16 MED ORDER — SODIUM BICARBONATE 8.4 % IV SOLN
INTRAVENOUS | Status: AC | PRN
  Administered 2019-11-16 (×2): 50 meq via INTRAVENOUS

## 2019-11-16 MED ORDER — FENTANYL 2500MCG IN NS 250ML (10MCG/ML) PREMIX INFUSION
0.0000 ug/h | INTRAVENOUS | Status: DC
  Administered 2019-11-16: 25 ug/h via INTRAVENOUS
  Filled 2019-11-16: qty 250

## 2019-11-16 MED ORDER — EPINEPHRINE 1 MG/10ML IJ SOSY
PREFILLED_SYRINGE | INTRAMUSCULAR | Status: AC | PRN
  Administered 2019-11-16: 0.1 mg via INTRAVENOUS

## 2019-11-16 MED ORDER — LORAZEPAM 2 MG/ML IJ SOLN
2.0000 mg | Freq: Once | INTRAMUSCULAR | Status: AC
  Administered 2019-11-16: 07:00:00 2 mg via INTRAVENOUS
  Filled 2019-11-16: qty 1

## 2019-11-16 MED ORDER — SODIUM CHLORIDE 0.9 % IV SOLN
2.0000 g | Freq: Once | INTRAVENOUS | Status: AC
  Administered 2019-11-16: 2 g via INTRAVENOUS
  Filled 2019-11-16: qty 2

## 2019-11-16 MED ORDER — HYDROCORTISONE NA SUCCINATE PF 100 MG IJ SOLR: 50.0000 mg | Freq: Four times a day (QID) | INTRAMUSCULAR | Status: DC

## 2019-11-16 MED ORDER — DEXTROSE 5 % IV SOLN
INTRAVENOUS | Status: AC | PRN
  Administered 2019-11-16: 150 mg via INTRAVENOUS

## 2019-11-16 MED ORDER — ETOMIDATE 2 MG/ML IV SOLN
INTRAVENOUS | Status: AC | PRN
  Administered 2019-11-16: 20 mg via INTRAVENOUS

## 2019-11-16 MED ORDER — MAGNESIUM SULFATE 50 % IJ SOLN
INTRAMUSCULAR | Status: AC | PRN
  Administered 2019-11-16: 2 g via INTRAVENOUS

## 2019-11-17 ENCOUNTER — Ambulatory Visit: Payer: Medicare HMO | Admitting: Internal Medicine

## 2019-11-17 LAB — URINE CULTURE: Culture: NO GROWTH

## 2019-11-18 ENCOUNTER — Inpatient Hospital Stay: Payer: Medicare HMO | Admitting: Family Medicine

## 2019-11-21 LAB — CULTURE, BLOOD (ROUTINE X 2)
Culture: NO GROWTH
Culture: NO GROWTH
Special Requests: ADEQUATE

## 2019-11-21 NOTE — ED Notes (Addendum)
Per MD initiate fentanyl. Fentanyl initiated and 25 mcg bolus given   Per MD stop fluids. Total of 1000 mLs NS given.

## 2019-11-21 NOTE — Code Documentation (Signed)
PEA noted on monitor then Fine v-fib,  Patient shocked with Zoll

## 2019-11-21 NOTE — ED Notes (Signed)
Respiratory called out that patient HR dropping. This RN grabbed crash cart, gowned up, and entered room. Critical care called.

## 2019-11-21 NOTE — Procedures (Signed)
CPR NOTE  PATIENT NAME: Makayla Vasquez MEDICAL RECORD NUMBER: [id number] Birthday: 07-16-1981  Age: 39 y.o. Admit Date: 2019/12/08  Provider: Dr Kalman Shan and APP Karin Lieu of cCM - location [place] ER bed 18. We were in dictation room at [facility]ER. Called by nursing staff emergenttly to bedside for code on this patient previously seen by CCM  Known EF 35% 10/31/2019  Indication: PEA -> asystole arrest while on vent. 100% fio2. Later possible  V Fib  Technical Description:   CPR performance duration: > 25 minutes  Was defibrillation or cardioversion used ? yes  Was external pacer placed ? no  Was patient intubated pre/post CPR ? She was already on vent  Was transvenous pacer placed ? Not needed  Medications Administered Include      Yes/no Amiodarone X 1 towards when there was concern for V Fib  Atropin no  Calcium X 1  Epinephrine X 8  Lidocaine no  Magnesium X 2  Norepinephrine Ongoing   Phenylephrine no  Sodium bicarbonate X 2  Vasopression no   Evaluation  Final Status - Was patient successfully resuscitated ? no  If successfully resuscitated - what is current rhythm ? dead If successfully resuscitated - what is current hemodynamic status ? 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 Kalman Shan, M.D., F.C.C.P,  Pulmonary and Critical Care Medicine Staff Physician, [facility] Director - Interstitial Lung Disease  Program  Pulmonary Fibrosis [facility] [place], [place], [address]  Pager: [phone], If no answer or between  15:00h - 7:00h: call [phone] Telephone: [phone]  9:12 AM 12/08/2019

## 2019-11-21 NOTE — H&P (Addendum)
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  Recent hospital stay for COVID-19 pneumonia treated with steroids and remdesivir.  Was discharged on her previous baseline 2 to 4 L nasal cannula.  Her immunosuppressant medications were held at discharge on 1/24 including CellCept and tacrolimus.  Her home prednisone had been increased.  She was further being treated with Ancef and rifampin for MSSA bacteremia, the source was thought to be a PICC line which was removed before discharge on 1/24.  A new PICC line was placed.  Patient came in with dyspnea on exertion and on her baseline 2LNC. Per ED nurse having worsening cough with desaturation which failed to improve with non rebreather and heated high flow. Intubated for hypoxemic respiratory failure. Was having fevers.   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 rate (!) 26, height 5' 5"  (1.651 m), weight 63.5 kg, SpO2 100 %.    Vent  Mode: PRVC FiO2 (%):  [100 %] 100 % Set Rate:  [16 bmp] 16 bmp Vt Set:  [450 mL] 450 mL PEEP:  [5 cmH20] 5 cmH20   Intake/Output Summary (Last 24 hours) at 09-Dec-2019 9528 Last data filed at 09-Dec-2019 0600 Gross per 24 hour  Intake 10 ml  Output 30 ml  Net -20 ml   Filed Weights   2019-12-09 0409  Weight: 63.5 kg    Examination: General: intubated, not responsive HENT: ETT in place Lungs: Coarse bilateral breath sounds, breathing 30 with RR set at 16 Cardiovascular: tachycardic, loud S2, regular Abdomen: soft, nontender, non distended Extremities: no edema Neuro: no spontaneous movements MSK: few ecchymoses in upper and lower extremities, no rashes Lines/Tubes: RUE PICC line, 2PIV  Assessment & Plan:  Occasionally is a 39 year old woman with a history of T synthetase syndrome, with a Jo 1+ antibody, with recent hospital stay for MSSA bacteremia and COVID-19 pneumonia who presents with:  Acute on chronic hypoxemic respiratory failure COVID 19 Pneumonia  Circulatory versus septic vs cardiogenic shock 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 MSSA bacteremia Acute on chronic heart failure with reduced ejection fraction EF 35 to 40%, with elevated RVSP  Lactic acidosis  Chest xray reviewed - worsening bilateral airspace opacities concerning for ARDS secondary to COVID 19 pneumonia. May also be worsened secondary to Heart failure exacerbation. Would stop any fluid resuscitation. Maintain full vent support, with low tidal volume ventilation Will need stress dose steroids for her shock  given her immunosuppression and steroid use at baseline in the setting of shock.  Continue Vanco cefepime for now, blood cultures and urine cultures are pending. She is supposed to be on Ancef through 11/22/2019 for MSSA bacteremia I've ordered a repeat ABG now that she's intubated Admit to ICU -  apparently bound [facility].    Best practice:  Diet: N.p.o.  pain/Anxiety/Delirium protocol (if indicated): Fentanyl infusion for goal RASS -2 VAP protocol (if indicated): Will need DVT prophylaxis: heparin Subcu ordered GI prophylaxis: PPI ordered Glucose control: Goal less than 180 Foley yes for critical illness Mobility: Bedrest Code Status: Full code Disposition: ICU  Labs   CBC: Recent Labs  Lab 11/10/19 1540 11/10/19 1540 11/11/19 0600 11/12/19 0416 11/12/19 1747 11/13/19 0256 11/24/2019 0408  WBC 6.4   < > 5.1 5.1 4.2 4.6 9.6  NEUTROABS 4.0  --   --  3.1 1.7 2.6 4.2  HGB 9.1*   < > 7.4* 7.2* 8.4* 7.3* 9.3*  HCT 33.2*   < > 26.7* 26.6* 30.9* 26.6* 34.6*  MCV 79.6*   < > 79.0* 79.6* 78.6* 78.9* 80.5  PLT 298   < > 249 271 282 272 472*   < > = values in this interval not displayed.    Basic Metabolic Panel: Recent Labs  Lab 11/10/19 1540 11/11/19 0600 11/12/19 0416 11/13/19 0256 2019/11/24 0408  NA 134* 135 134* 137 138  K 3.6 3.4* 4.5 4.2 4.9  CL 93* 102 105 108 107  CO2 29 24 21* 20* 17*  GLUCOSE 103* 86 94 164* 135*  BUN 14 14 12 11 15   CREATININE 1.03* 0.91 0.79 0.77 1.19*  CALCIUM 8.0* 7.3* 7.6* 7.6* 8.2*  MG  --  1.2*  --  1.7  --    GFR: Estimated Creatinine Clearance: 57.1 mL/min (A) (by C-G formula based on SCr of 1.19 mg/dL (H)). Recent Labs  Lab 11/10/19 1540 11/10/19 1550 11/10/19 1822 11/11/19 0600 11/12/19 0416 11/12/19 1747 11/13/19 0256 24-Nov-2019 0408  PROCALCITON 0.16  --   --   --   --   --   --   --   WBC 6.4  --   --    < > 5.1 4.2 4.6 9.6  LATICACIDVEN  --  2.0* 2.0*  --   --   --   --  6.4*   < > = values in this interval not displayed.    Liver Function Tests: Recent Labs  Lab 11/10/19 1540 11/11/19 0600 11/24/19 0408  AST 34 28 194*  ALT 6 7 50*  ALKPHOS 64 49 83  BILITOT 0.7 0.9 0.9  PROT 6.8 5.8* 7.1  ALBUMIN 2.6* 2.4* 3.0*   Recent Labs  Lab 11/10/19 1540  LIPASE 19   No results for input(s): AMMONIA in  the last 168 hours.  ABG    Component Value Date/Time   PHART 7.438 10/25/2019 1215   PCO2ART 38.5 10/25/2019 1215   PO2ART 108 10/25/2019 1215   HCO3 25.8 10/25/2019 1215   TCO2 23 09/23/2016 2214   ACIDBASEDEF 2.0 09/23/2016 2214   O2SAT 98.6 10/25/2019 1215     Coagulation Profile: Recent Labs  Lab 2019/11/24 0408  INR 1.6*    Cardiac Enzymes: No results for input(s): CKTOTAL, CKMB, CKMBINDEX, TROPONINI in the last 168 hours.  HbA1C: HbA1c, POC (controlled diabetic range)  Date/Time Value Ref Range Status  10/03/2019 04:13 PM 7.0 0.0 - 7.0 % Final  06/09/2019 02:05 PM 7.1 (A) 0.0 -  7.0 % Final    CBG: Recent Labs  Lab 11/12/19 1614 11/12/19 2127 11/13/19 0802 11/13/19 1124 11/13/19 1617  GLUCAP 128* 122* 95 181* 119*    Review of Systems:   Unable to obtain due to patient condition  Past Medical History  She,  has a past medical history of Anxiety, Arthritis, Asthma, Asymptomatic LV dysfunction, Bipolar affective disorder [facility]), Cardiac pacemaker, Carpal tunnel syndrome of right wrist, CHF (congestive heart failure) ([place]), Congenital complete AV block, Cor pulmonale, chronic ([place]) (04/08/2018), Depression, Diabetes mellitus without complication ([place]), GERD (gastroesophageal reflux disease), Hypertension, Interstitial lung disease ([place]), Lupus ([place]), Lupus (systemic lupus erythematosus) ([place]), Obesity, Pneumonitis, Presence of permanent cardiac pacemaker, Seizures ([place]), and Sinus tachycardia.   Surgical History    Past Surgical History:  Procedure Laterality Date  . ATRIAL TACH ABLATION N/A 08/14/2014   Procedure: ATRIAL TACH ABLATION;  Surgeon: [name], MD;  Location: [facility] CATH LAB;  Service: Cardiovascular;  Laterality: N/A;  . BI-VENTRICULAR PACEMAKER UPGRADE N/A 03/08/2012   Procedure: BI-VENTRICULAR PACEMAKER UPGRADE;  Surgeon: [name], MD;  Location: [facility] CATH LAB;  Service: Cardiovascular;  Laterality: N/A;  . CARPAL TUNNEL WITH CUBITAL TUNNEL  Right 07/26/2013   Procedure: RIGHT LIMITED OPEN CARPAL TUNNEL RELEASE ,  RIGHT CUBITAL TUNNEL RELEASE, INSITU VERSES ULNAR NERVE DECOMPRESSION AND ANTERIOR TRANSPOSITION;  Surgeon: [name], MD;  Location: [facility];  Service: Orthopedics;  Laterality: Right;  . CESAREAN SECTION    . CHOLECYSTECTOMY    . CYST EXCISION  12/10/2012   THYROID  . INSERT / REPLACE / REMOVE PACEMAKER     2001  . IUD REMOVAL  11/03/2011   Procedure: INTRAUTERINE DEVICE (IUD) REMOVAL;  Surgeon: [name], MD;  Location: [facility];  Service: Gynecology;  Laterality: N/A;  . NASAL FRACTURE SURGERY     /w plate   . RIGHT HEART CATHETERIZATION N/A 10/26/2014   Procedure: RIGHT HEART CATH;  Surgeon: [name], MD;  Location: [facility] CATH LAB;  Service: Cardiovascular;  Laterality: N/A;  . TEE WITHOUT CARDIOVERSION N/A 10/31/2019   Procedure: TRANSESOPHAGEAL ECHOCARDIOGRAM (TEE);  Surgeon: [name], MD;  Location: [facility] ENDOSCOPY;  Service: Cardiovascular;  Laterality: N/A;  . THROAT SURGERY  1994   s/p laser treatment  . THYROGLOSSAL DUCT CYST N/A 12/10/2012   Procedure: REVISION OF THYROGLOSSAL DUCT CYST EXCISION;  Surgeon: [name], MD;  Location: [place];  Service: ENT;  Laterality: N/A;  Revision of Thyroglossal Duct Cyst Excision  . VIDEO BRONCHOSCOPY Bilateral 06/19/2014   Procedure: VIDEO BRONCHOSCOPY WITHOUT FLUORO;  Surgeon: Brand Males, MD;  Location: [place];  Service: Cardiopulmonary;  Laterality: Bilateral;     Social History   reports that she quit smoking about 23 years ago. Her smoking use included cigarettes. She has a 0.13 pack-year smoking history. She has never used smokeless tobacco. She reports that she does not drink alcohol or use drugs.   Family History   Her family history includes Heart disease in her father and mother; Heart disease (age of onset: 27) in her sister; Hypertension in her mother; Lupus in her mother.   Allergies Allergies  Allergen Reactions  . Atorvastatin  Other (See Comments)    Possible statin-induced myopathy with elevated CK if on Lipitor 40 mg  . Sertraline Hcl Hives  . Tape Other (See Comments)    "Burns" the skin     Home Medications  Prior to Admission medications   Medication Sig Start Date End Date Taking? Authorizing  Provider  bisoprolol (ZEBETA) 5 MG tablet Take 0.5 tablets (2.5 mg total) by mouth daily. 11/13/19  Yes Bonnita Hollow, MD  ceFAZolin (ANCEF) IVPB Inject 2 g into the vein every 8 (eight) hours for 21 days. Indication:  MSSA bacteremia Last Day of Therapy:  11/22/19 Labs - Once weekly:  CBC/D and BMP, Labs - Every other week:  ESR and CRP 11/01/19 11/22/19 Yes Eniola, Kehinde T, MD  dextromethorphan-guaiFENesin (ROBITUSSIN-DM) 10-100 MG/5ML liquid TAKE 5 MLS BY MOUTH EVERY 4 (FOUR) HOURS AS NEEDED FOR COUGH. Patient taking differently: Take 5 mLs by mouth every 4 (four) hours as needed for cough.  10/27/18  Yes [place] Bing, DO  Etonogestrel (NEXPLANON [place]) Inject 1 Device into the skin once. Every 3 years   Yes [provider]  ferrous sulfate 325 (65 FE) MG EC tablet Take 1 tablet (325 mg total) by mouth daily with breakfast. 08/11/19 02/07/20 Yes Lockamy, Timothy, DO  LANTUS SOLOSTAR 100 UNIT/ML Solostar Pen INJECT [place] DAY Patient taking differently: Inject 7 Units into the skin daily.  01/05/19  Yes [place] Bing, DO  loratadine (CLARITIN) 10 MG tablet Take 1 tablet (10 mg total) by mouth daily. 08/15/19  Yes Young, Tarri Fuller D, MD  metFORMIN (GLUCOPHAGE) 1000 MG tablet TAKE 1 TABLET BY MOUTH TWICE DAILY WITH A MEAL Patient taking differently: Take 1,000 mg by mouth 2 (two) times daily with a meal.  03/22/19  Yes [place] Bing, DO  omeprazole (PRILOSEC) 20 MG capsule TAKE 1 CAPSULE BY MOUTH EVERY DAY Patient taking differently: Take 20 mg by mouth daily.  05/02/19  Yes Baird Lyons D, MD  traZODone (DESYREL) 50 MG tablet TAKE 1 TABLET (50 MG TOTAL) BY MOUTH AT BEDTIME AS NEEDED  FOR SLEEP. 09/27/18  Yes [place] Bing, DO  albuterol (VENTOLIN HFA) 108 (90 Base) MCG/ACT inhaler Inhale 2 puffs into the lungs every 4 (four) hours as needed for wheezing or shortness of breath. Patient not taking: Reported on 11/10/2019 02/25/19   Caroline More, DO  Alcohol Swabs (B-D SINGLE USE SWABS REGULAR) PADS 1 application by Does not apply route 4 (four) times daily. 03/15/19   [place] Bing, DO  benzonatate (TESSALON PERLES) 100 MG capsule Take 1 capsule (100 mg total) by mouth 3 (three) times daily as needed for cough. Patient not taking: Reported on 10/24/2019 10/17/19   Stark Klein, MD  Blood Glucose Calibration (ACCU-CHEK AVIVA) SOLN 1 application by In Vitro route 4 (four) times daily. 03/18/19   [place] Bing, DO  blood glucose meter kit and supplies Dispense based on patient and insurance preference. Use up to four times daily as directed. (FOR ICD-10 E10.9, E11.9). 03/18/19   [place] Bing, DO  ceFAZolin (ANCEF) IVPB Inject 2 g into the vein every 8 (eight) hours for 9 days. Indication: MSSA bacteremia Last Day of Therapy:  11/22/2019 Labs - Once weekly:  CBC/D and BMP, Labs - Every other week:  ESR and CRP 11/13/19 11/22/19  Bonnita Hollow, MD  cromolyn (NASALCROM) 5.2 MG/ACT nasal spray Place 1 spray into both nostrils 4 (four) times daily. Patient not taking: Reported on 10/24/2019 08/15/19   Baird Lyons D, MD  fluticasone furoate-vilanterol (BREO ELLIPTA) 100-25 MCG/INH AEPB Inhale 1 puff into the lungs daily. Patient not taking: Reported on 10/24/2019 06/07/19   Baird Lyons D, MD  OXYGEN Inhale 2-4 L into the lungs continuous.     [provider]  predniSONE (DELTASONE) 20 MG  tablet Take 40 mg for 3 days, then 20 mg for 3 days, then restart your home 10 mg. 11/13/19 11/19/19  Bonnita Hollow, MD  rifampin (RIFADIN) 300 MG capsule Take 2 capsules (600 mg total) by mouth daily for 20 days. 11/02/19 11/22/19  Lyndee Hensen, MD     Critical care time    The patient is critically ill with multiple organ systems failure and requires high complexity decision making for assessment and support, frequent evaluation and titration of therapies, application of advanced monitoring technologies and extensive interpretation of multiple databases.   Critical Care Time devoted to patient care services described in this note is 64 minutes. This time reflects my personal involvement in patient care. This critical care time does not reflect separately billable procedures or procedure time, teaching time or supervisory time of PA/NP/Med student/Med Resident etc but could involve care discussion time.  [facility] 2019/11/21 6:21 AM  Pager: [phone] After hours pager: [phone]

## 2019-11-21 NOTE — Code Documentation (Signed)
NP attempted to find pulse with dopplers, no pulse noted, Fine V-fib on the monitor.

## 2019-11-21 NOTE — Code Documentation (Signed)
CPR Paused, Pulse check No pulse noted fine v-fib noted on the monitor

## 2019-11-21 NOTE — ED Notes (Signed)
Patient intubated, positive color change

## 2019-11-21 NOTE — Progress Notes (Signed)
A consult was received from an ED physician for Cefepime & Vancomycin per pharmacy dosing.  The patient's profile has been reviewed for ht/wt/allergies/indication/available labs.   She is on IV Ancef for MSSA bacteremia as outpatient- last dose reported as 1/26 ~ 6pm.  A one time order has been placed for Cefepime 2gm & Vancomycin 1gm.  Further antibiotics/pharmacy consults should be ordered by admitting physician if indicated.                       Thank you, Junita Push, PharmD, BCPS Dec 03, 2019  5:59 AM

## 2019-11-21 NOTE — ED Notes (Signed)
Rate dose change for Levophed. Decreased to 41mcg-30mLs/hr. BP-165/118.

## 2019-11-21 NOTE — ED Notes (Addendum)
Patient transferred to morgue. Belongings sent with patient and bagged in brown paper bags.  2 belonging bags: bag1- coat with phone. In inside pocket wallet. In outside pocket credit card.  bag2- bedroom slippers and PJ pants.

## 2019-11-21 NOTE — ED Notes (Signed)
25 mcg fentnyl bolus given patient breathing over vent.

## 2019-11-21 NOTE — ED Provider Notes (Signed)
I was called to pt's bedside for a code blue.  Pt had been seen already by CCM.  She was covid positive and was intubated earlier this morning.  Pt's fentanyl drip stopped.  She was in PEA.  CPR was started.  Patches were applied.  She was switched from to ventilator to bagging and was given an amp of epi.  Critical care was called and took over pt's care.   Jacalyn Lefevre, MD 12/16/19 [phone]

## 2019-11-21 NOTE — Code Documentation (Signed)
No pulse, Fine v-fib noted on monitor Shock administered with Zoll

## 2019-11-21 NOTE — ED Notes (Signed)
Attempted to ME X2. No answer. Voicemail left and call back number.

## 2019-11-21 NOTE — ED Notes (Signed)
Patient going to 208-1. Spoke to Diplomatic Services operational officer bed is ready. On hold for report.

## 2019-11-21 NOTE — Progress Notes (Signed)
Rt found the pt pulseless and CPR was started. Ambu bag hepa filter, CO2 monitoring and peep valve were used. The pt was COVId +. CPR was called at 0857. RT extubated the Pt with a towel over her and left the towel after extubation.

## 2019-11-21 NOTE — Telephone Encounter (Signed)
**  After Hours/ Emergency Line Call**  Received a call to report that [name] reporting fever.    Patient checked her temperature and it was 99.5. Now temp is 100.5. Patient does endorse chills. Does report nausea as well. 1 episode of emesis, NBNB. No diarrhea. Does endorse cough. Still taking antibiotics.  Of note patient with recent discharge on 1/24 due to concern for infection with indwelling PICC line. She was treated with ancef and rifampin as well as cefepime. PICC line was removed.   Patient with recent admission for MSSA bacteremia as well and is on cefazolin and rifampin for this. She reports good compliance with this.   Given that patient continues to spike fevers despite IV and PO antibiotics with recent admissions for bacteremia I recommended patient be seen in ED. Differentials would include bacteremia, new PICC line infection, or benign viral process. Does endorse vomiting as well. Patient reports she will go to ED.   Red flags discussed.  Will forward to PCP.   Oralia Manis, DO PGY-3, [facility] November 24, 2019 1:55 AM

## 2019-11-21 NOTE — ED Provider Notes (Signed)
[facility] DEPT Provider Note   CSN: [id number] Arrival date & time: 2019/12/09  8657     History Chief Complaint  Patient presents with  . COVID Positive    Makayla Vasquez is a 39 y.o. female.  Patient presents to the emergency department with a chief complaint of shortness of breath.  She was recently admitted family practice residency, who are her primary care doctors, with Covid-19.  She was also bacteremic with MSSA.  She was discharged on 1/24 with a PICC line and continues to receive IV antibiotics.  She states that she ran a fever to 100.5 degrees today.  She called her doctor, and was encouraged to come to the emergency department for evaluation.   The history is provided by the patient. No language interpreter was used.       Past Medical History:  Diagnosis Date  . Anxiety   . Arthritis    rheumatoid arthritis- mild, no rheumatology care   . Asthma    seasonal allergies   . Asymptomatic LV dysfunction    a. Echo in Dec 2011 with EF 35 to 40%. Felt to be due to paced rhythm. b. EF 25-30% in 07/2014.  . Bipolar affective disorder ([place])   . Cardiac pacemaker    a. Since age 89 in 79. b. Upgrade to BiV in 2013.  [name] Carpal tunnel syndrome of right wrist   . CHF (congestive heart failure) ([place])   . Congenital complete AV block   . Cor pulmonale, chronic ([place]) 04/08/2018  . Depression    bipolar  . Diabetes mellitus without complication ([place])   . GERD (gastroesophageal reflux disease)   . Hypertension   . Interstitial lung disease ([place])   . Lupus ([place])   . Lupus (systemic lupus erythematosus) ([place])   . Obesity   . Pneumonitis    a. a/w hypoxia - inflammatory - large workup 07/2014.  . Presence of permanent cardiac pacemaker   . Seizures ([place])    as a child- from high fever  . Sinus tachycardia     Patient Active Problem List   Diagnosis Date Noted  . Generalized weakness 11/10/2019  . Bacteremia 11/10/2019  . Paroxysmal  tachycardia ([place]) 10/31/2019  . Bacteremia due to methicillin susceptible Staphylococcus aureus (MSSA) 10/26/2019  . COVID-19 10/24/2019  . Chronic obstructive pulmonary disease ([place])   . Increased sputum production 10/17/2019  . Acute bronchitis 06/07/2019  . Screen for STD (sexually transmitted disease) 05/15/2019  . Moderate persistent asthma with acute exacerbation 04/19/2019  . Chronic nasal congestion 10/29/2018  . CAP (community acquired pneumonia) 10/24/2018  . Immunosuppression ([place]) 10/24/2018  . Pulmonary disease due to systemic lupus erythematosus (SLE) ([place]) 10/24/2018  . Diabetes mellitus due to therapeutic use of corticosteroid ([place]) 10/24/2018  . Right wrist pain 07/24/2018  . Anxiety state 07/21/2018  . Upper airway cough syndrome 06/18/2018  . Cor pulmonale, chronic ([place]) 04/08/2018  . Abnormal uterine bleeding 02/14/2018  . Dyspareunia, female 05/14/2017  . Sepsis without acute organ dysfunction ([place]) 01/05/2017  . Emesis 01/04/2017  . 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 On Cellcept therapy   . Lupus ([place])   . Fever   . Long term (current) use of systemic steroids   . Chronic diastolic congestive heart failure ([place])   . Cough   . Interstitial lung disease ([place])   . Anemia   . Bipolar affective disorder in remission ([place])   . Acute on chronic congestive heart failure ([place])   .  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 Adrenal insufficiency ([place])   . SOB (shortness of breath)   . 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 Idiopathic hypotension 07/08/2015  . Hyperlipidemia associated with type 2 diabetes mellitus ([place]) 05/04/2015  . Diabetes mellitus type 2 in obese ([place])   . Chronic respiratory failure with hypoxia ([place]) 08/07/2014  . Acute respiratory failure with hypoxia ([place]) 06/19/2014  . Congestive dilated cardiomyopathy ([place]) 05/31/2014  . Acute on chronic HFrEF (heart failure with reduced ejection fraction) ([place]) 02/26/2012  .  PPM-Medtronic   . Congenital complete AV block   . GERD (gastroesophageal reflux disease)   . 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 Polycystic ovaries 12/17/2006    Past Surgical History:  Procedure Laterality Date  . ATRIAL TACH ABLATION N/A 08/14/2014   Procedure: ATRIAL TACH ABLATION;  Surgeon: [name], MD;  Location: [facility] CATH LAB;  Service: Cardiovascular;  Laterality: N/A;  . BI-VENTRICULAR PACEMAKER UPGRADE N/A 03/08/2012   Procedure: BI-VENTRICULAR PACEMAKER UPGRADE;  Surgeon: [name], MD;  Location: [facility] CATH LAB;  Service: Cardiovascular;  Laterality: N/A;  . CARPAL TUNNEL WITH CUBITAL TUNNEL Right 07/26/2013   Procedure: RIGHT LIMITED OPEN CARPAL TUNNEL RELEASE ,  RIGHT CUBITAL TUNNEL RELEASE, INSITU VERSES ULNAR NERVE DECOMPRESSION AND ANTERIOR TRANSPOSITION;  Surgeon: [name], MD;  Location: [facility];  Service: Orthopedics;  Laterality: Right;  . CESAREAN SECTION    . CHOLECYSTECTOMY    . CYST EXCISION  12/10/2012   THYROID  . INSERT / REPLACE / REMOVE PACEMAKER     2001  . IUD REMOVAL  11/03/2011   Procedure: INTRAUTERINE DEVICE (IUD) REMOVAL;  Surgeon: [name], MD;  Location: [facility];  Service: Gynecology;  Laterality: N/A;  . NASAL FRACTURE SURGERY     /w plate   . RIGHT HEART CATHETERIZATION N/A 10/26/2014   Procedure: RIGHT HEART CATH;  Surgeon: [name], MD;  Location[facility] C[facility] LAB;  Service: Cardiovascular;  Laterality: N/A;  . TEE WITHOUT CARDIOVERSION N/A 10/31/2019   Procedure: TRANSESOPHAGEAL ECHOCARDIOGRAM (TEE);  Surgeon: [name], MD;  Location: [facility] ENDOSCOPY;  Service: Cardiovascular;  Laterality: N/A;  . THROAT SURGERY  1994   s/p laser treatment  . THYROGLOSSAL DUCT CYST N/A 12/10/2012   Procedure: REVISION OF THYROGLOSSAL DUCT CYST EXCISION;  Surgeon: [name], MD;  Location: [facility];  Service: ENT;  Laterality: N/A;  Revision of Thyroglossal Duct Cyst Excision  . VIDEO BRONCHOSCOPY Bilateral 06/19/2014   Procedure: VIDEO  BRONCHOSCOPY WITHOUT FLUORO;  Surgeon: Brand Males, MD;  Location: [facility];  Service: Cardiopulmonary;  Laterality: Bilateral;     OB History    Gravida  1   Para  1   Term  1   Preterm      AB      Living  1     SAB      TAB      Ectopic      Multiple      Live Births              Family History  Problem Relation Age of Onset  . Heart disease Mother        CHF (no details)  . Hypertension Mother   . Lupus Mother   . Heart disease Father        Murmur  . Heart disease Sister 62        No details.  History of a pacemaker  Social History   Tobacco Use  . Smoking status: Former Smoker    Packs/day: 0.25    Years: 0.50    Pack years: 0.12    Types: Cigarettes    Quit date: 07/25/1996    Years since quitting: 23.3  . Smokeless tobacco: Never Used  Substance Use Topics  . Alcohol use: No  . Drug use: No    Home Medications Prior to Admission medications   Medication Sig Start Date End Date Taking? Authorizing Provider  albuterol (VENTOLIN HFA) 108 (90 Base) MCG/ACT inhaler Inhale 2 puffs into the lungs every 4 (four) hours as needed for wheezing or shortness of breath. Patient not taking: Reported on 11/10/2019 02/25/19   Caroline More, DO  Alcohol Swabs (B-D SINGLE USE SWABS REGULAR) PADS 1 application by Does not apply route 4 (four) times daily. 03/15/19   [place] Bing, DO  benzonatate (TESSALON PERLES) 100 MG capsule Take 1 capsule (100 mg total) by mouth 3 (three) times daily as needed for cough. Patient not taking: Reported on 10/24/2019 10/17/19   Stark Klein, MD  bisoprolol (ZEBETA) 5 MG tablet Take 0.5 tablets (2.5 mg total) by mouth daily. 11/13/19   Bonnita Hollow, MD  Blood Glucose Calibration (ACCU-CHEK AVIVA) SOLN 1 application by In Vitro route 4 (four) times daily. 03/18/19   [place] Bing, DO  blood glucose meter kit and supplies Dispense based on patient and insurance preference. Use up to four times daily as  directed. (FOR ICD-10 E10.9, E11.9). 03/18/19   [place] Bing, DO  ceFAZolin (ANCEF) IVPB Inject 2 g into the vein every 8 (eight) hours for 21 days. Indication:  MSSA bacteremia Last Day of Therapy:  11/22/19 Labs - Once weekly:  CBC/D and BMP, Labs - Every other week:  ESR and CRP 11/01/19 11/22/19  Kinnie Feil, MD  ceFAZolin (ANCEF) IVPB Inject 2 g into the vein every 8 (eight) hours for 9 days. Indication: MSSA bacteremia Last Day of Therapy:  11/22/2019 Labs - Once weekly:  CBC/D and BMP, Labs - Every other week:  ESR and CRP 11/13/19 11/22/19  Bonnita Hollow, MD  cromolyn (NASALCROM) 5.2 MG/ACT nasal spray Place 1 spray into both nostrils 4 (four) times daily. Patient not taking: Reported on 10/24/2019 08/15/19   Baird Lyons D, MD  dextromethorphan-guaiFENesin (ROBITUSSIN-DM) 10-100 MG/5ML liquid TAKE 5 MLS BY MOUTH EVERY 4 (FOUR) HOURS AS NEEDED FOR COUGH. Patient taking differently: Take 5 mLs by mouth every 4 (four) hours as needed for cough.  10/27/18   [place] Bing, DO  Etonogestrel (NEXPLANON [place]) Inject 1 Device into the skin once. Every 3 years    [provider]  ferrous sulfate 325 (65 FE) MG EC tablet Take 1 tablet (325 mg total) by mouth daily with breakfast. 08/11/19 02/07/20  Lockamy, Timothy, DO  fluticasone furoate-vilanterol (BREO ELLIPTA) 100-25 MCG/INH AEPB Inhale 1 puff into the lungs daily. Patient not taking: Reported on 10/24/2019 06/07/19   Deneise Lever, MD  LANTUS SOLOSTAR 100 UNIT/ML Solostar Pen INJECT [place] DAY Patient taking differently: Inject 7 Units into the skin daily.  01/05/19   [place] Bing, DO  loratadine (CLARITIN) 10 MG tablet Take 1 tablet (10 mg total) by mouth daily. 08/15/19   Baird Lyons D, MD  metFORMIN (GLUCOPHAGE) 1000 MG tablet TAKE 1 TABLET BY MOUTH TWICE DAILY WITH A MEAL Patient taking differently: Take 1,000 mg by mouth 2 (two) times daily with a meal.  03/22/19   [place] Bing, DO    omeprazole (PRILOSEC) 20 MG capsule TAKE 1 CAPSULE BY MOUTH EVERY DAY Patient taking differently: Take 20 mg by mouth daily.  05/02/19   Baird Lyons D, MD  OXYGEN Inhale 2-4 L into the lungs continuous.     [provider]  predniSONE (DELTASONE) 20 MG tablet Take 40 mg for 3 days, then 20 mg for 3 days, then restart your home 10 mg. 11/13/19 11/19/19  Bonnita Hollow, MD  rifampin (RIFADIN) 300 MG capsule Take 2 capsules (600 mg total) by mouth daily for 20 days. 11/02/19 11/22/19  Lyndee Hensen, MD  traZODone (DESYREL) 50 MG tablet TAKE 1 TABLET (50 MG TOTAL) BY MOUTH AT BEDTIME AS NEEDED FOR SLEEP. 09/27/18    Bing, DO    Allergies    Atorvastatin, Sertraline hcl, and Tape  Review of Systems   Review of Systems  All other systems reviewed and are negative.   Physical Exam Updated Vital Signs BP 95/71 (BP Location: Left Arm)   Pulse 86   Temp 99 F (37.2 C) (Oral)   Resp (!) 22   SpO2 98%   Physical Exam Vitals and nursing note reviewed.  Constitutional:      General: She is not in acute distress.    Appearance: She is well-developed.  HENT:     Head: Normocephalic and atraumatic.  Eyes:     Conjunctiva/sclera: Conjunctivae normal.  Cardiovascular:     Rate and Rhythm: Normal rate and regular rhythm.     Heart sounds: No murmur.  Pulmonary:     Effort: No respiratory distress.     Comments: Tachypneic Abdominal:     Palpations: Abdomen is soft.     Tenderness: There is no abdominal tenderness.  Musculoskeletal:        General: Normal range of motion.     Cervical back: Neck supple.  Skin:    General: Skin is warm and dry.     Comments: PICC in RUE, no surrounding erythema or sign of infection  Neurological:     Mental Status: She is alert and oriented to person, place, and time.  Psychiatric:        Mood and Affect: Mood normal.        Behavior: Behavior normal.     ED Results / Procedures / Treatments   Labs (all labs ordered are  listed, but only abnormal results are displayed) Labs Reviewed  LACTIC ACID, PLASMA - Abnormal; Notable for the following components:      Result Value   Lactic Acid, Venous 6.4 (*)    All other components within normal limits  COMPREHENSIVE METABOLIC PANEL - Abnormal; Notable for the following components:   CO2 17 (*)    Glucose, Bld 135 (*)    Creatinine, Ser 1.19 (*)    Calcium 8.2 (*)    Albumin 3.0 (*)    AST 194 (*)    ALT 50 (*)    GFR calc non Af Amer 57 (*)    All other components within normal limits  CBC WITH DIFFERENTIAL/PLATELET - Abnormal; Notable for the following components:   Hemoglobin 9.3 (*)    HCT 34.6 (*)    MCH 21.6 (*)    MCHC 26.9 (*)    RDW 20.8 (*)    Platelets 472 (*)    All other components within normal limits  PROTIME-INR - Abnormal; Notable for the following components:   Prothrombin Time 18.9 (*)  INR 1.6 (*)    All other components within normal limits  CULTURE, BLOOD (ROUTINE X 2)  CULTURE, BLOOD (ROUTINE X 2)  URINE CULTURE  APTT  LACTIC ACID, PLASMA  URINALYSIS, ROUTINE W REFLEX MICROSCOPIC  BRAIN NATRIURETIC PEPTIDE  I-STAT BETA HCG BLOOD, ED ([facility], [facility], [facility] ONLY)    EKG EKG Interpretation  Date/Time:  November 20, 2019 04:07:21 EST Ventricular Rate:  86 PR Interval:    QRS Duration: 134 QT Interval:  408 QTC Calculation: 488 R Axis:   127 Text Interpretation: Sinus rhythm Multiple ventricular premature complexes Short PR interval Prominent P waves, nondiagnostic IVCD, consider atypical RBBB Abnormal lateral Q waves Nonspecific ST depression Abnormal T, consider ischemia, lateral leads Baseline wander in lead(s) V1 When compared with ECG of 11/10/2019, No significant change was found Confirmed by Delora Fuel ([id number]) on 11-20-2019 4:18:53 AM   Radiology DG Chest Port 1 View  Result Date: Nov 20, 2019 CLINICAL DATA:  Shortness of breath EXAM: PORTABLE CHEST 1 VIEW COMPARISON:  Chest CT from 6 days ago FINDINGS: Chronic  cardiomegaly. There is a biventricular pacer from the left in stable position. Bilateral interstitial and airspace disease that is chronic with mildly increased density at the right base. Lung volumes are low. No visible effusion or air leak. Right-sided PICC with tip at the right atrium. IMPRESSION: Chronic cardiomegaly and interstitial lung disease. Superimposed infection is difficult to detect given the extensive chronic changes, mild increasing density at the right base may be a sign of acute airspace disease. Electronically Signed   By: Monte Fantasia M.D.   On: 11/20/19 05:15    Procedures .Critical Care Performed by: Montine Circle, PA-C Authorized by: Montine Circle, PA-C   Critical care provider statement:    Critical care time (minutes):  42   Critical care was necessary to treat or prevent imminent or life-threatening deterioration of the following conditions:  Respiratory failure   Critical care was time spent personally by me on the following activities:  Discussions with consultants, evaluation of patient's response to treatment, examination of patient, ordering and performing treatments and interventions, ordering and review of laboratory studies, ordering and review of radiographic studies, pulse oximetry, re-evaluation of patient's condition, obtaining history from patient or surrogate and review of old charts   (including critical care time)  Medications Ordered in ED Medications - No data to display  ED Course  I have reviewed the triage vital signs and the nursing notes.  Pertinent labs & imaging results that were available during my care of the patient were reviewed by me and considered in my medical decision making (see chart for details).    MDM Rules/Calculators/A&P                      Patient was admitted to the hospital about 2 weeks ago with COVID.  Ended up also having MSSA bacteremia.  Was just discharged on 1/24.  Has been continuing IV antibiotics  through PICC.  She states she began running a fever and this prompted her to call her PCP.  Her PCP instructed her to come to the ER for evaluation.    Will check labs and repeat CXR.  Patient on 2L [place].  Speaking in complete sentences. Will reassess.   0530 I was called to the bedside to re-evaluate the patient.  Patient looks decompensated.  Much more tachypneic.  Now requiring NRB O2 on 15L. I immediately called Dr. Roxanne Mins to the bedside and we proceed  with intubation for respiratory failure.  5:55 AM Lactate is 6.4, will start 31m/kg fluid resuscitation. She does have hx of heart failure and will likely need diuresis.  I discussed this with Dr. GRoxanne Mins and he agrees with plan.  Will consult critical care for admission.  6:12 AM I discussed the case with critical care, Dr. OLucile Shutters who will have the ground team see the patient.  Final Clinical Impression(s) / ED Diagnoses Final diagnoses:  Acute respiratory failure with hypoxia ([facility]    Rx / DC Orders ED Discharge Orders    None       BMontine Circle PA-C [id number]   GDelora Fuel MD [phone]

## 2019-11-21 NOTE — Progress Notes (Addendum)
Pharmacy Antibiotic Note  Makayla Vasquez is a 39 y.o. female with PMH SLE on immunosuppressants, interstitial lung disease, and HFrEF, admitted and intubated on 2019/11/27 with respiratory failure. Of note, recently treated for COVID and MSSA bacteremia, and scheduled to be on Ancef + Rifampin through 11/22/19. Pharmacy has been consulted for vancomycin and cefepime dosing.  Plan:  Vancomycin 1000 mg IV now, then 1250 mg IV q24 hr (est AUC 528 based on SCr 1.2, Vd 0.72)  Measure vancomycin AUC at steady state as indicated  SCr q48 while on vanc  Cefepime 2 g IV q12 hr  Will repeat MRSA PCR since none recent. If no new growth on BCx, a negative PCR may help r/o MRSA pneumonia as well   Height: 5\' 5"  (165.1 cm) Weight: 140 lb (63.5 kg) IBW/kg (Calculated) : 57  Temp (24hrs), Avg:99 F (37.2 C), Min:99 F (37.2 C), Max:99 F (37.2 C)  Recent Labs  Lab 11/10/19 1540 11/10/19 1540 11/10/19 1550 11/10/19 1822 11/11/19 0600 11/12/19 0416 11/12/19 1747 11/13/19 0256 2019-11-27 0408  WBC 6.4   < >  --   --  5.1 5.1 4.2 4.6 9.6  CREATININE 1.03*  --   --   --  0.91 0.79  --  0.77 1.19*  LATICACIDVEN  --   --  2.0* 2.0*  --   --   --   --  6.4*   < > = values in this interval not displayed.    Estimated Creatinine Clearance: 57.1 mL/min (A) (by C-G formula based on SCr of 1.19 mg/dL (H)).    Allergies  Allergen Reactions  . 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 Sertraline Hcl Hives  . Tape Other (See Comments)    "Burns" the skin     Thank you for allowing pharmacy to be a part of this patient's care.  Makayla Vasquez A November 27, 2019 8:43 AM

## 2019-11-21 NOTE — ED Notes (Signed)
Spoke to bed placement and updated checklist.

## 2019-11-21 NOTE — ED Notes (Signed)
Date and time results received: 12/11/19 5:50 AM  (use smartphrase ".now" to insert current time)  Test: lactic acid Critical Value: 6.4  Name of Provider Notified: Roxy Horseman, PA  Orders Received? Or Actions Taken?:

## 2019-11-21 NOTE — ED Notes (Signed)
Spoke with patient's sister Clydie Braun to update on patient's status and admission to [facility]

## 2019-11-21 NOTE — ED Notes (Signed)
Respiratory at bedside.

## 2019-11-21 NOTE — Sepsis Progress Note (Signed)
Notified bedside nurse of need to draw repeat lactic acid. 

## 2019-11-21 NOTE — ED Triage Notes (Signed)
Arrived by EMS with c/o [facility]. Patient reports she has tested COVID positive for second time. Patient 90% on room air 100% on 2L/[place]. NAD

## 2019-11-21 NOTE — ED Notes (Signed)
25mcg fentanyl bolus given

## 2019-11-21 NOTE — ED Notes (Addendum)
Critical care at bedside. Verbal to stop Fentanyl drip and continue levophed.   Patient being bagged by respiratory.   Crash cart at bedside and pads placed.

## 2019-11-21 NOTE — Code Documentation (Signed)
Critical care NP and MD assess for pulse, PEA noted, no pulse palpated, CPR restarted

## 2019-11-21 NOTE — ED Notes (Addendum)
  Called Dr Kalman Shan to sign death certificate. MD states he is no longer here and to called his office and do it electronically or to get EDP to sign. Charge, RN made aware. No answer at office.   Myriam Forehand, NP and told Dr Marchelle Gearing needs to sign.    Paged Ramaswamy. Waiting to here back. Death certificate and RNs desk.   Spoke to [facility] and told not to fill out death certificate if not signed by doctor.

## 2019-11-21 NOTE — ED Notes (Addendum)
Spoke to pathology and updated on patients case.  Pathology states [place] do not do autopsy on patients who are COVID positive.  Spoke to [employer]   [phone]  Per medical examiner- Benay Pillow.

## 2019-11-21 NOTE — Progress Notes (Signed)
Pts. 7.5 ET Tube pulled back from 26cm to 24 cm per Xray results, b/l b.s. auscultated s/p.

## 2019-11-21 DEATH — deceased

## 2019-11-24 ENCOUNTER — Other Ambulatory Visit: Payer: Self-pay | Admitting: Family Medicine

## 2019-11-30 ENCOUNTER — Ambulatory Visit: Payer: Medicare HMO | Admitting: Internal Medicine

## 2019-12-14 ENCOUNTER — Ambulatory Visit: Payer: Medicare HMO | Admitting: Allergy

## 2019-12-25 IMAGING — CT CT ANGIO CHEST
2 of 6 series · 17 of 36 positions shown · IV contrast (Omni 300)
Comparison: Chest CT January 05, 2017 and chest radiograph December 01, 2017 and February 22, 2016

CLINICAL DATA: Shortness of Breath

EXAM:
CT ANGIOGRAPHY CHEST WITH CONTRAST
TECHNIQUE: Multidetector CT imaging of the chest was performed using the
standard protocol during bolus administration of intravenous
contrast. Multiplanar CT image reconstructions and MIPs were
obtained to evaluate the vascular anatomy.
CONTRAST:  80 mL [id number] IOPAMIDOL ([id number]-AYN) INJECTION 76%

[Series 9: pe thins · axial · 0.72mm/px · z∈[-265,-41]mm · 16 of 252 slices shown]
[im 14/252  lung]
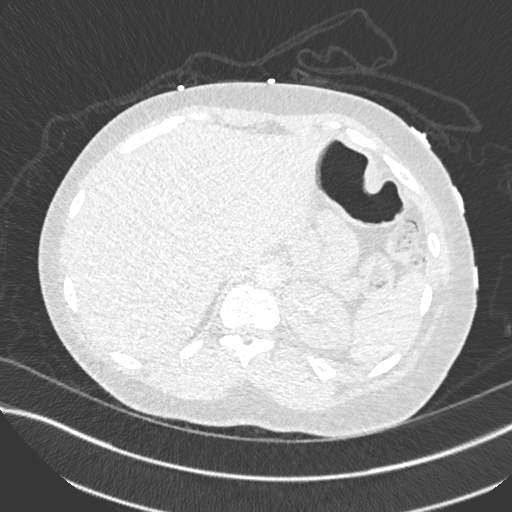
[im 28/252  mediastinal]
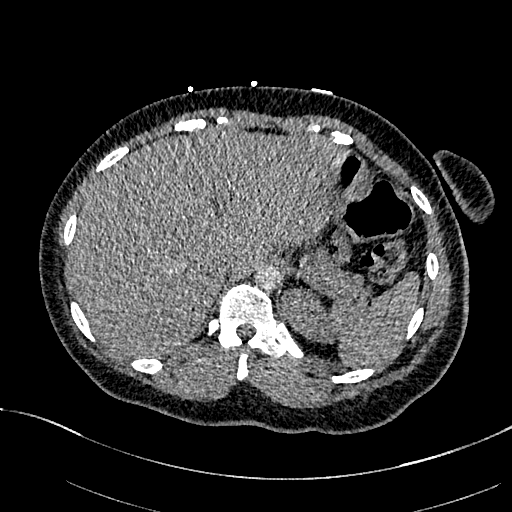
[im 42/252  lung]
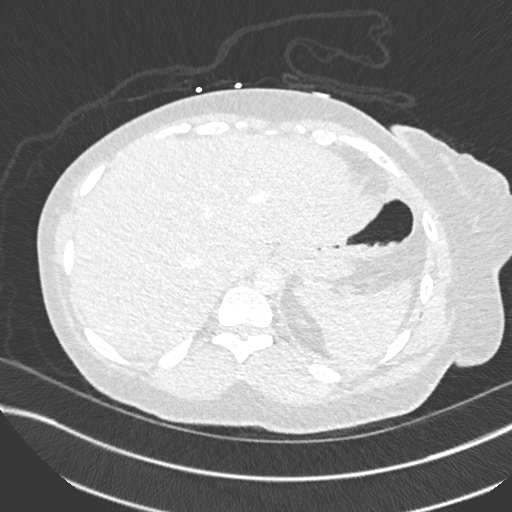
[im 56/252  mediastinal]
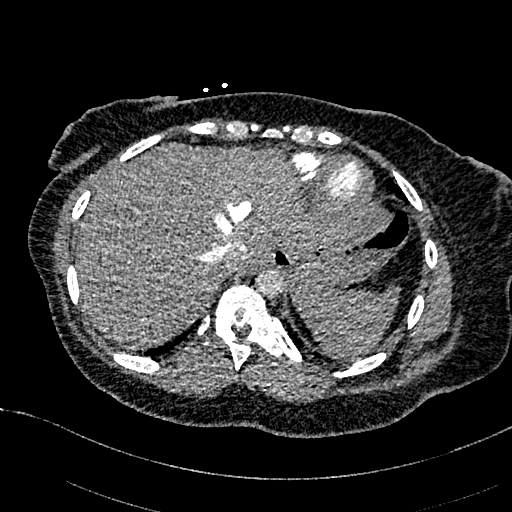
[im 70/252  lung]
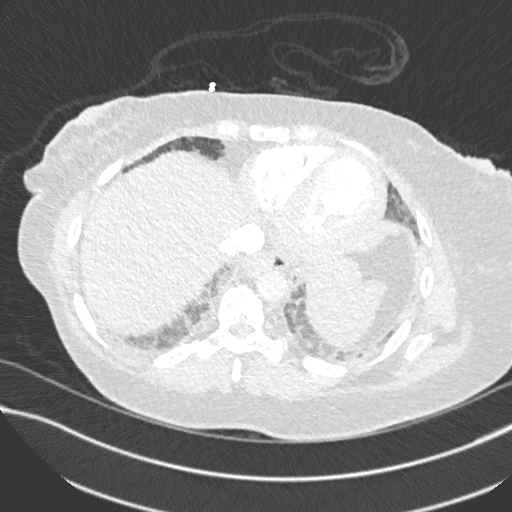
[im 84/252  mediastinal]
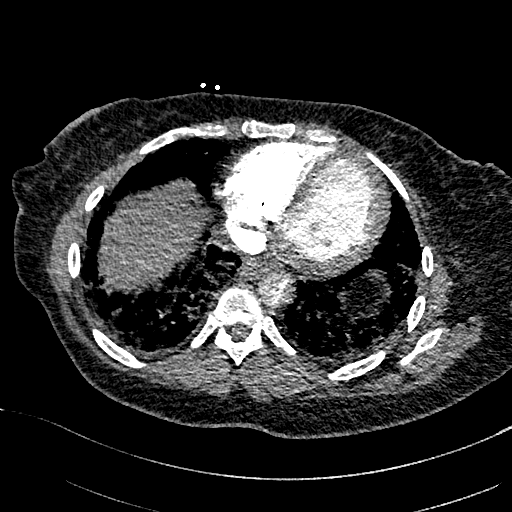
[im 98/252  lung]
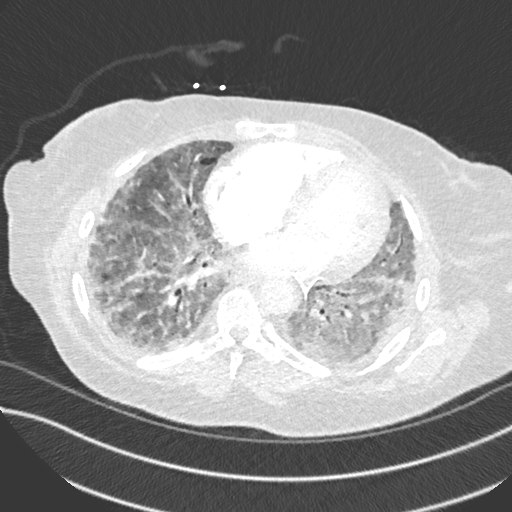
[im 112/252  mediastinal]
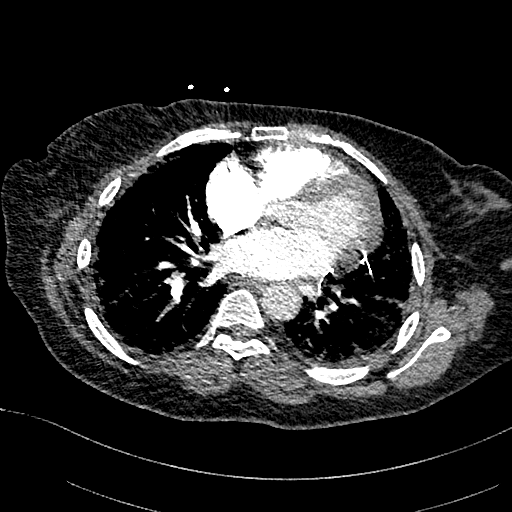
[im 140/252  lung]
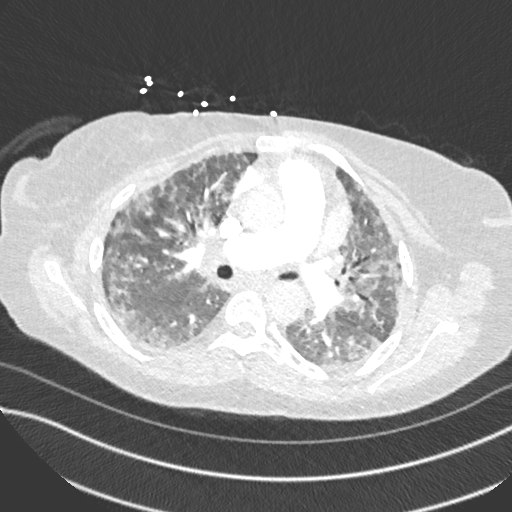
[im 154/252  mediastinal]
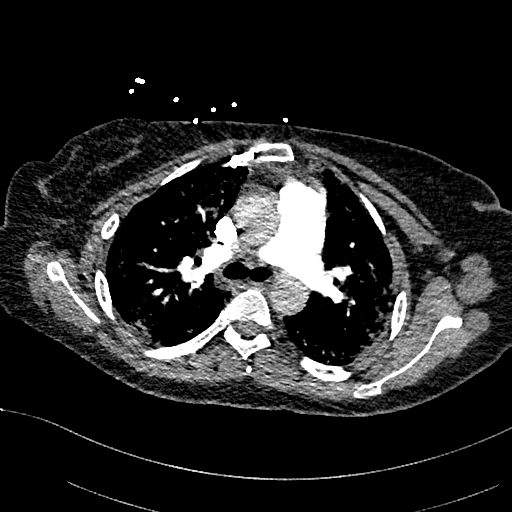
[im 168/252  lung]
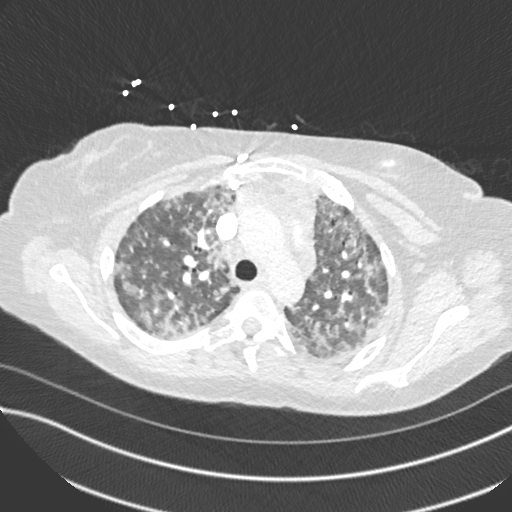
[im 182/252  mediastinal]
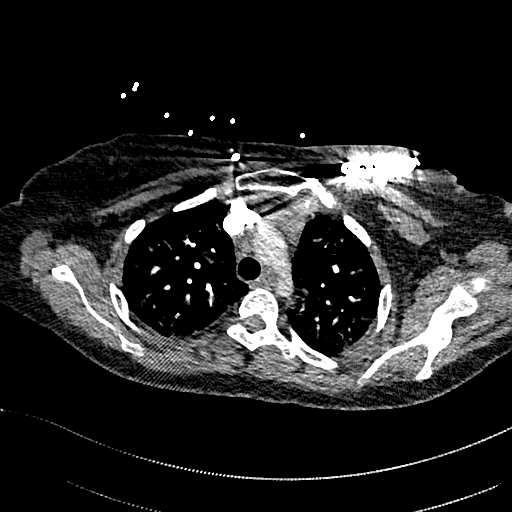
[im 196/252  lung]
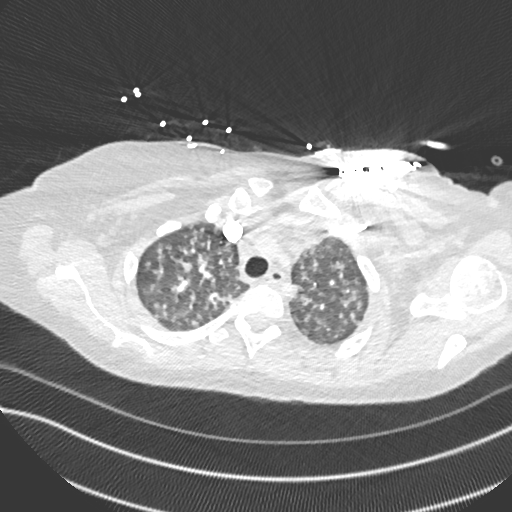
[im 210/252  mediastinal]
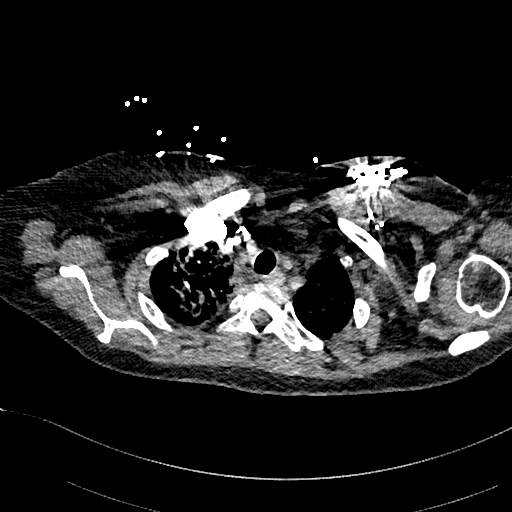
[im 224/252  lung]
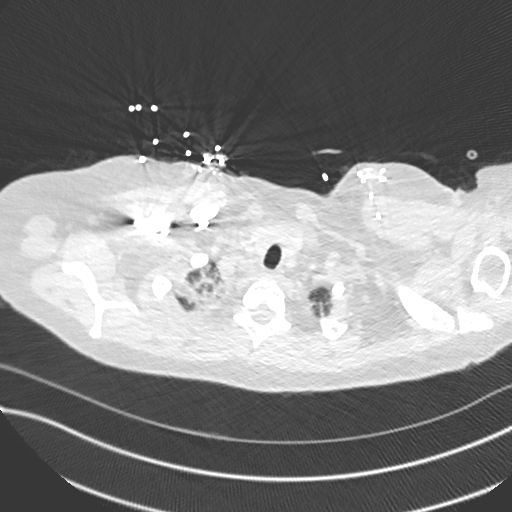
[im 238/252  mediastinal]
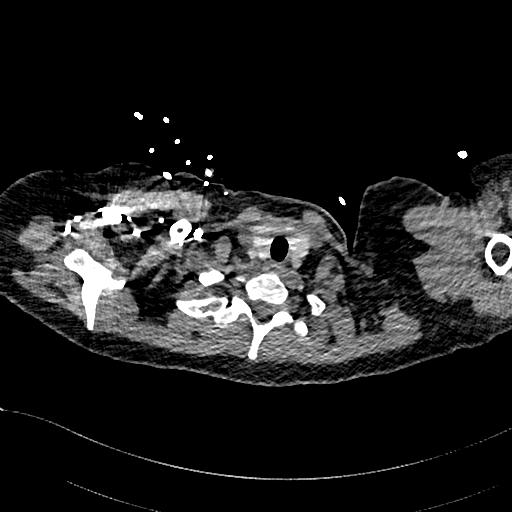

[Series 10: pe 2mm cor · coronal · 0.47mm/px · 1 of 120 slices shown]
[im 60/120  mediastinal]
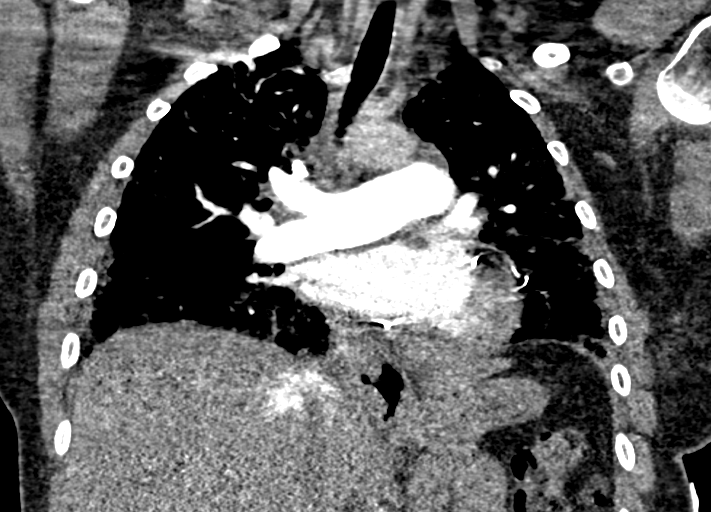

[17 of 36 positions shown; findings below may reference images not displayed]

FINDINGS: Cardiovascular: There is no demonstrable pulmonary embolus. There is
no appreciable thoracic aortic aneurysm or dissection. Pericardium
is not appreciably thickened. There is no pericardial effusion.
There is prominence of the main pulmonary outflow tract measuring
3.1 cm, felt to represent a degree of pulmonary arterial
hypertension. Pacemaker leads are attached the right atrium, right
ventricle, and coronary sinus.

Mediastinum/Nodes: Visualized thyroid appears unremarkable. There is
a right paratracheal lymph node measuring 1.4 x 1.3 cm. There is a
right hilar lymph node measuring 1.3 x 1.3 cm. There is an
aortopulmonary window lymph node measuring 1.7 x 1.3 cm. There is a
left hilar measuring 1.2 x 1.3 cm. There is a subcarinal lymph node
measuring 1.3 x 1.2 cm. No esophageal lesions are evident.

Lungs/Pleura: As was noted previously, there is diffuse
reticulonodular interstitial disease throughout the lungs diffusely
with multiple ground-glass type opacities, primarily in the lingula
and lower lobes bilaterally. There is more confluent opacity in the
posterior segment of the right upper lobe. A focal nodular opacity
in the posterior segment of the left lower lobe measuring 6 x 6 mm
is again noted and is stable, seen on axial slice 91 series 8. There
is no appreciable pleural effusion.

Upper Abdomen: There is reflux of contrast into the inferior vena
cava and hepatic veins. Visualized upper abdominal structures
otherwise appear unremarkable.

Musculoskeletal: No blastic or lytic bone lesions are evident.

Review of the MIP images confirms the above findings.
IMPRESSION: 1. No demonstrable pulmonary embolus. No thoracic aortic aneurysm or
dissection.

2. There remains widespread reticulonodular interstitial disease
with areas of ground-glass opacity, primarily in the lower lobes and
lingular regions. These changes are similar compared to prior
studies. Differential considerations include chronic post
infectious/postinflammatory scarring, chronic allergic type
reaction, a degree of chronic congestive heart failure with
associated chronic inflammatory type change in the lungs, and
sarcoidosis.

3. Stable 6 mm nodular opacity left lower lobe. This lesion has
remained stable since 6797 is felt to have benign etiology.

4. Focal airspace consolidation posterior segment right upper lobe.
Acute focus of pneumonia questioned.

5. Areas of adenopathy which could well have reactive etiology given
the parenchymal lung changes. Adenopathy secondary to sarcoidosis is
a consideration.

6. Prominence of the main pulmonary outflow tract, felt to represent
a degree of pulmonary arterial hypertension.

7. Reflux of contrast into the inferior vena cava and hepatic veins,
a finding felt to be indicative of increase in right heart pressure.

## 2019-12-25 IMAGING — DX DG CHEST 2V
2 series · 2 of 2 positions shown · non-contrast
Comparison: 10/01/2017

CLINICAL DATA: Shortness of breath and chest pain

EXAM:
CHEST  2 VIEW

[w chest lat]
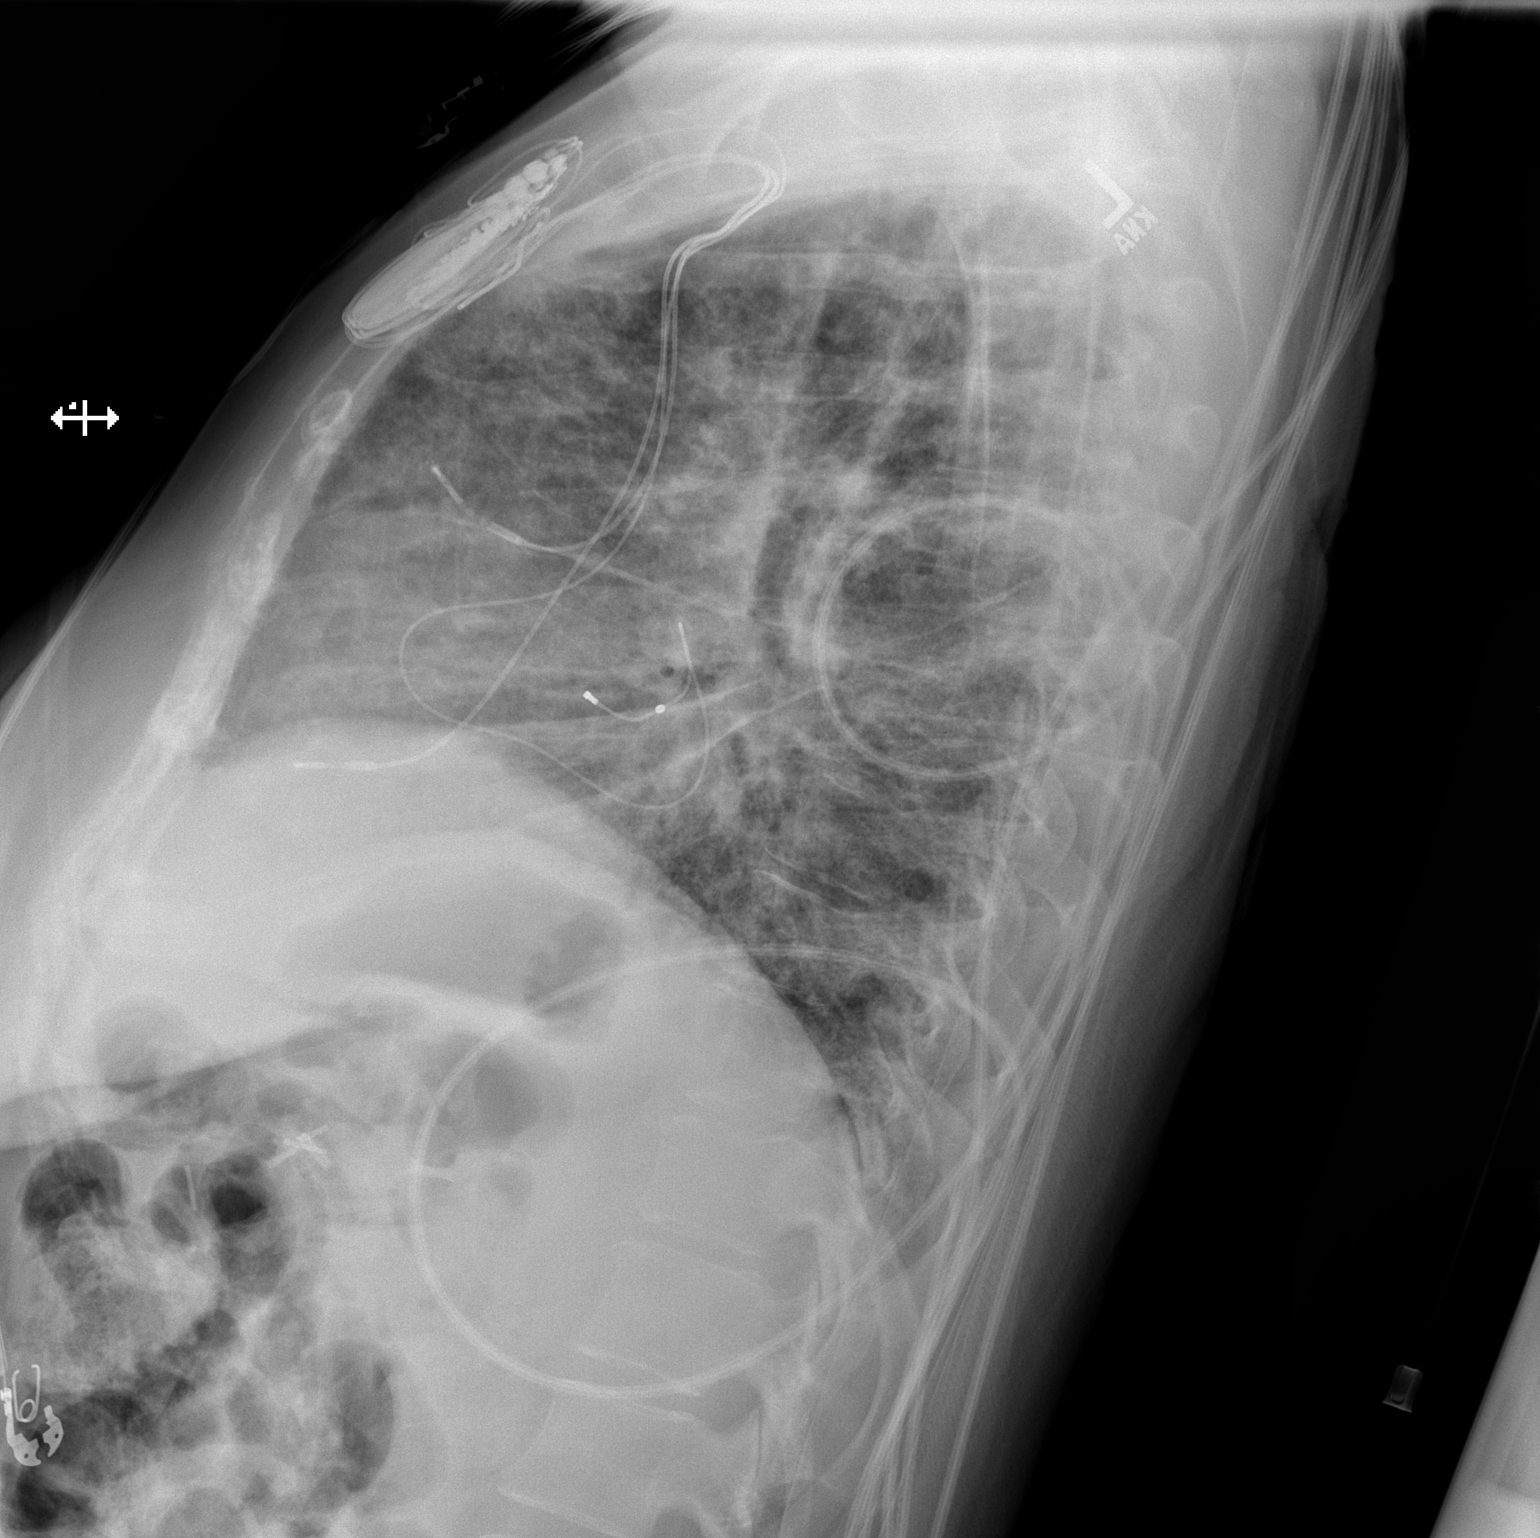

[x chest ap]
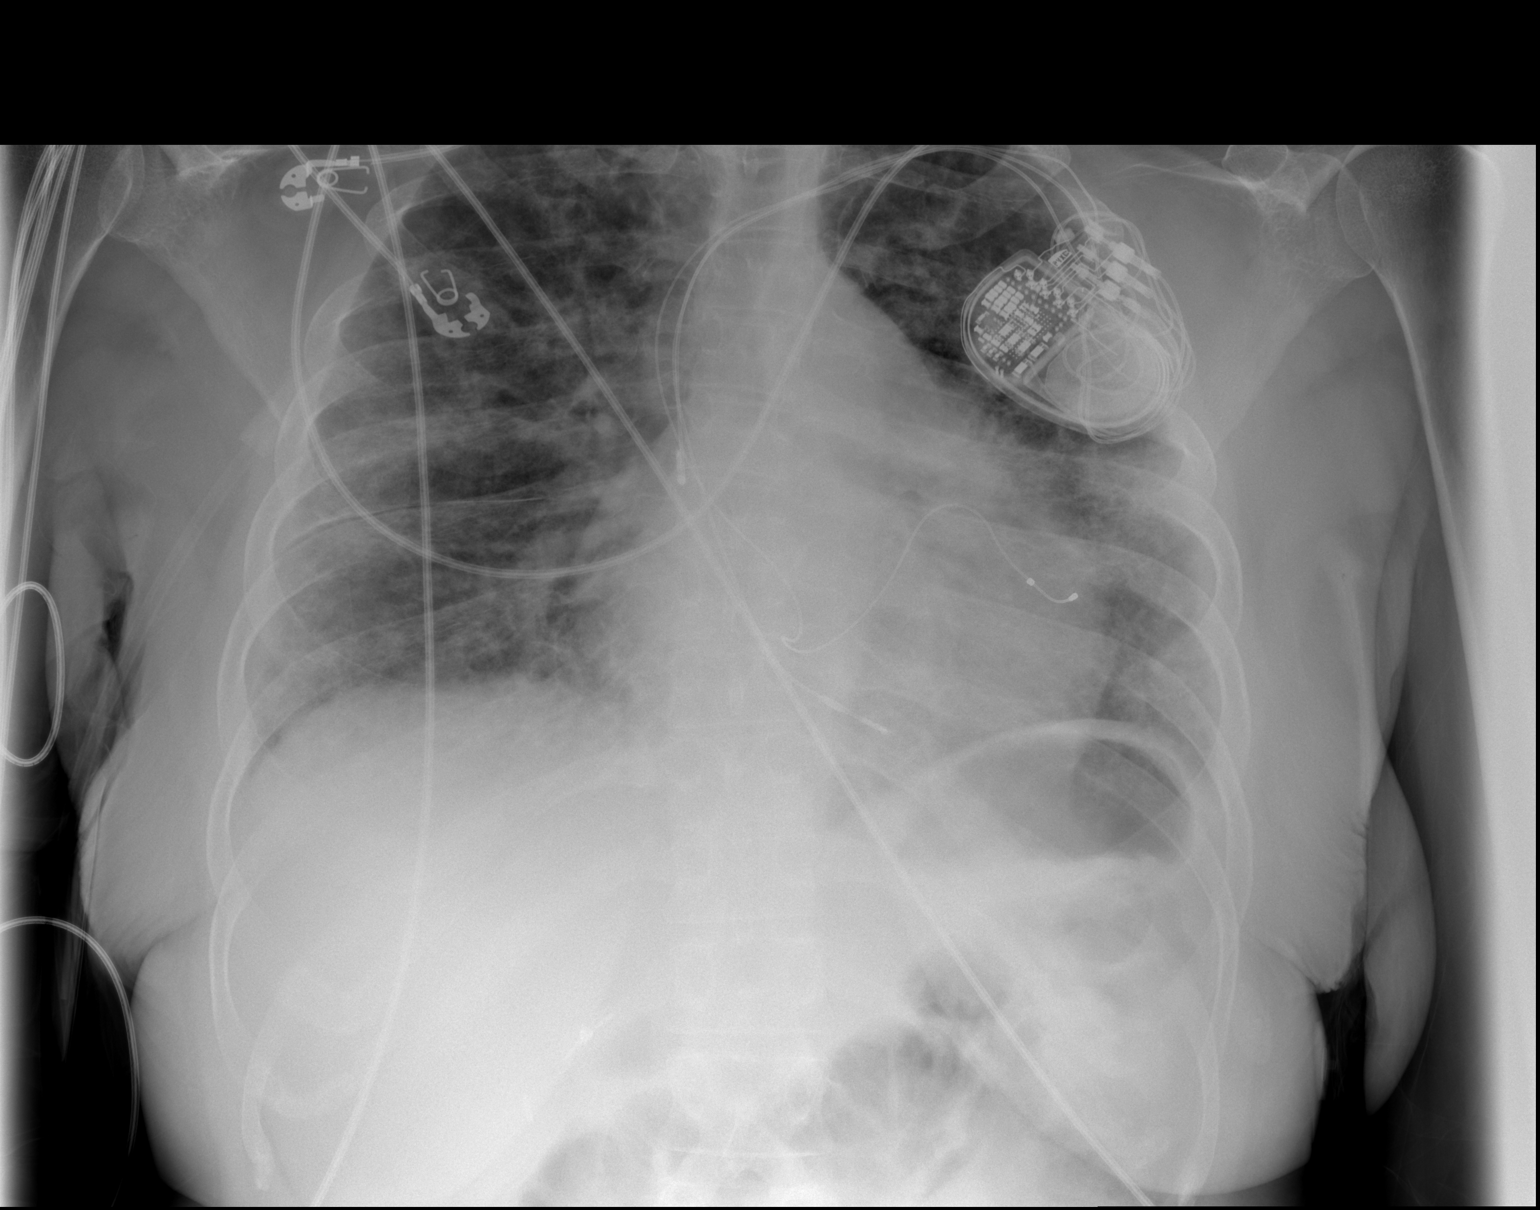

[2 of 2 positions shown; findings below may reference images not displayed]

FINDINGS: Cardiac shadow is stable. Pacing device is again noted. Patchy
fibrotic changes are again identified bilaterally stable from the
previous study. No new infiltrate or effusion is seen. No bony
abnormality is noted.
IMPRESSION: Chronic changes without acute abnormality.

## 2020-01-08 IMAGING — CT CT ANGIO CHEST-ABD-PELV FOR DISSECTION W/ AND WO/W CM
2 of 7 series · 12 of 46 positions shown, 14 images · IV contrast (APPLIED)
Comparison: 12/01/2017 CT chest.  01/04/2017 CT abdomen and pelvis.

CLINICAL DATA: 36 y/o F; hypotension, generalized fatigue, nausea,
vomiting, and abdominal pain for 1 week. History of pneumonitis,
lupus, interstitial lung disease, hypertension, GERD, diabetes, av
block, CHF, asthma, cardiac catheterization, cholecystectomy,
C-section, and pacemaker.

EXAM:
CT ANGIOGRAPHY CHEST, ABDOMEN AND PELVIS
TECHNIQUE: Multidetector CT imaging through the chest, abdomen and pelvis was
performed using the standard protocol during bolus administration of
intravenous contrast. Multiplanar reconstructed images and MIPs were
obtained and reviewed to evaluate the vascular anatomy.
CONTRAST:  100mL [id number] IOPAMIDOL ([id number]) INJECTION 76%

[Series 7: arterial · axial · arterial · 0.71mm/px · z∈[+853,+1365]mm · 9 of 304 slices shown, 11 images]
[im 32/304  soft-tissue]
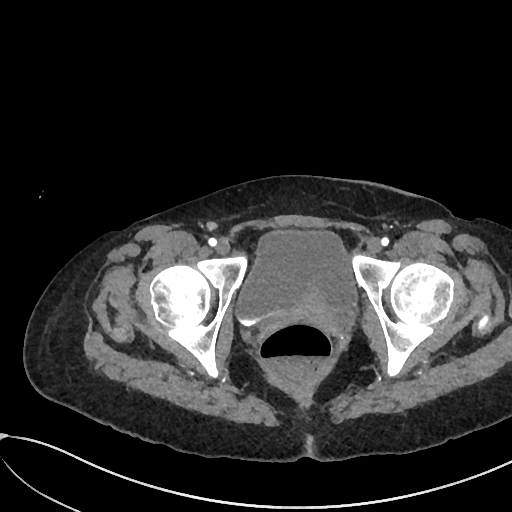
[im 32/304  bone]
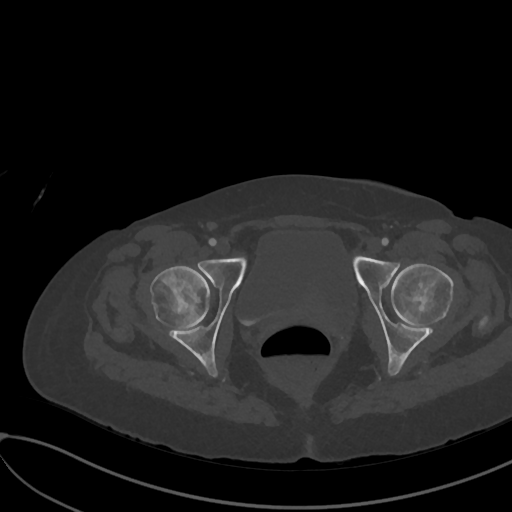
[im 64/304  soft-tissue]
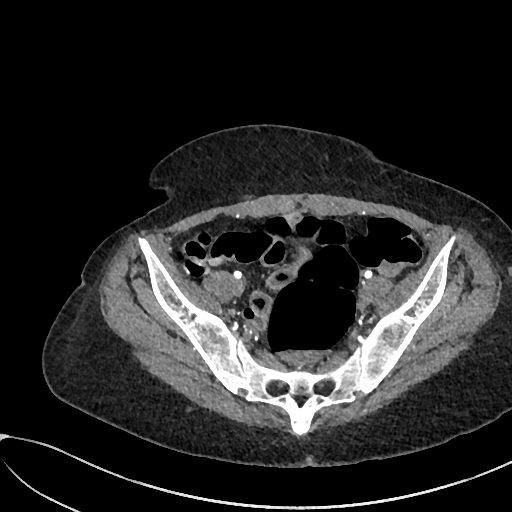
[im 96/304  soft-tissue]
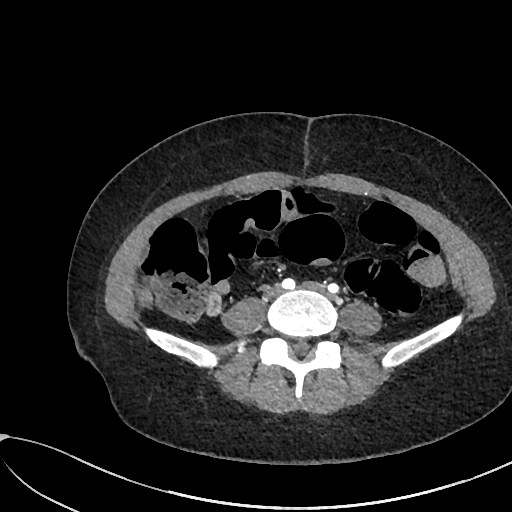
[im 128/304  soft-tissue]
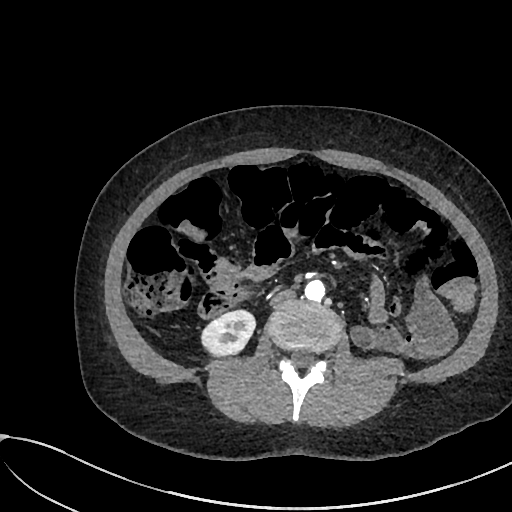
[im 160/304  soft-tissue]
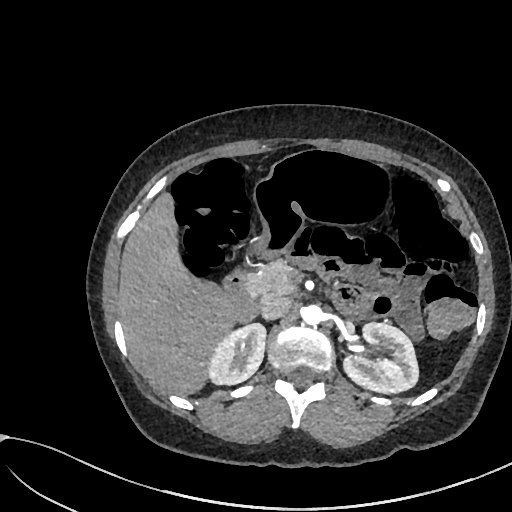
[im 192/304  soft-tissue]
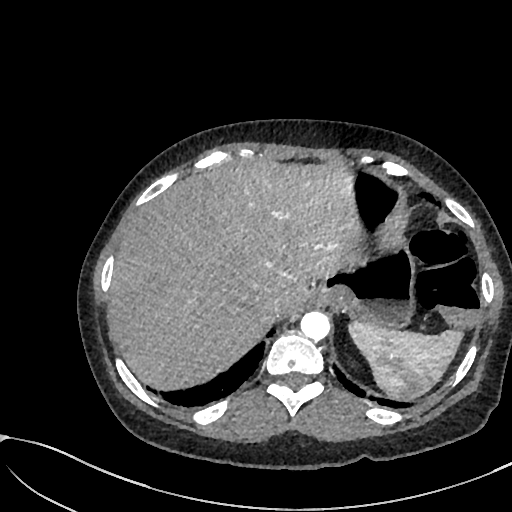
[im 224/304  soft-tissue]
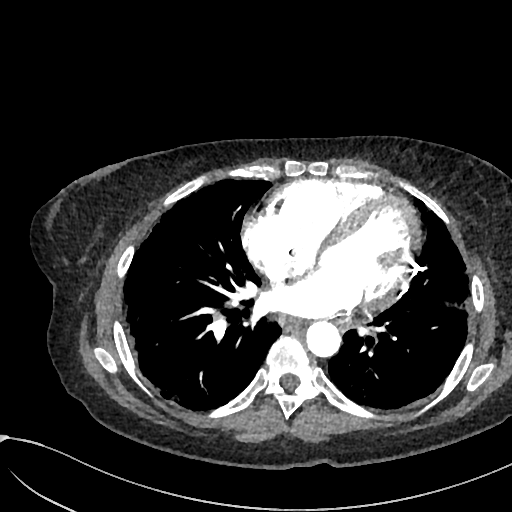
[im 256/304  soft-tissue]
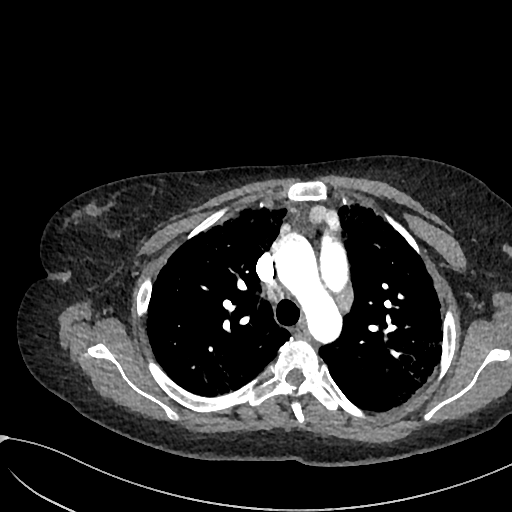
[im 288/304  soft-tissue]
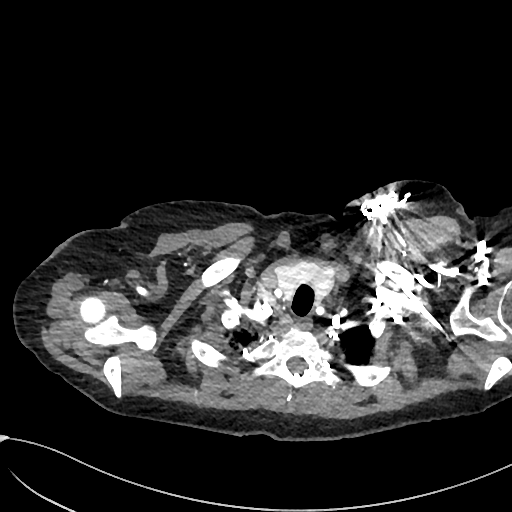
[im 288/304  bone]
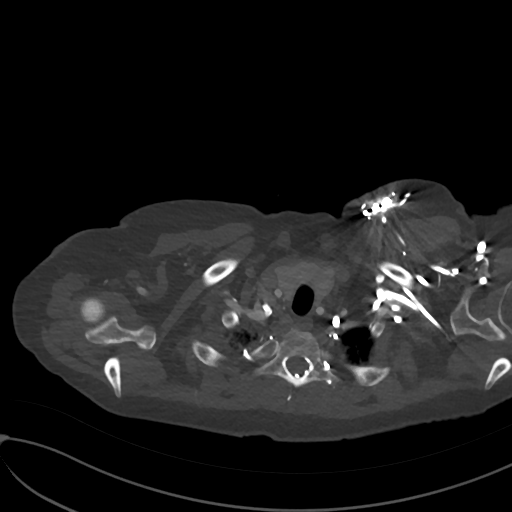

[Series 10: cor · coronal · 0.64mm/px · 3 of 128 slices shown]
[im 32/128  soft-tissue]
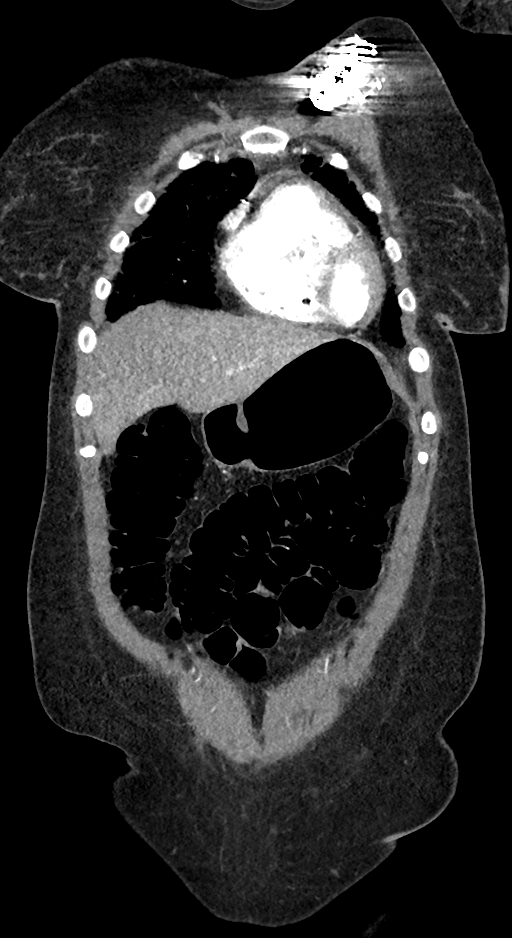
[im 64/128  soft-tissue]
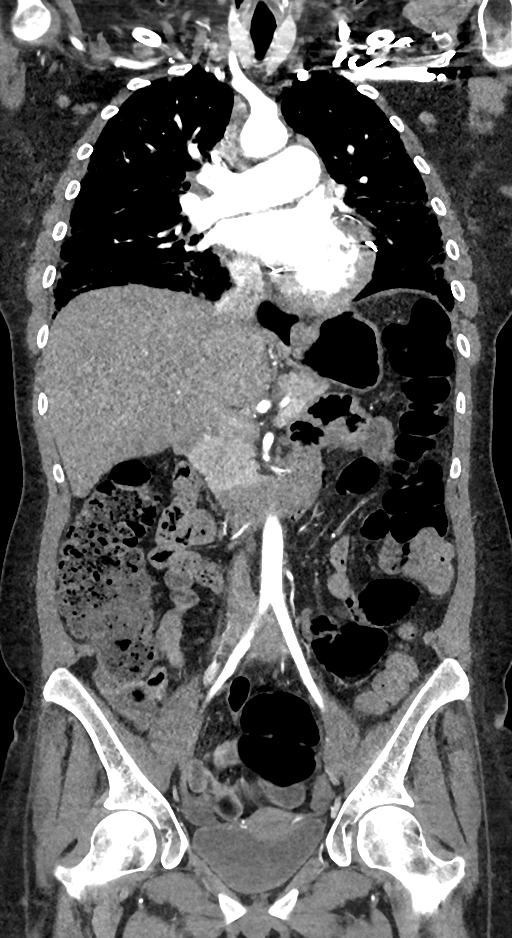
[im 96/128  soft-tissue]
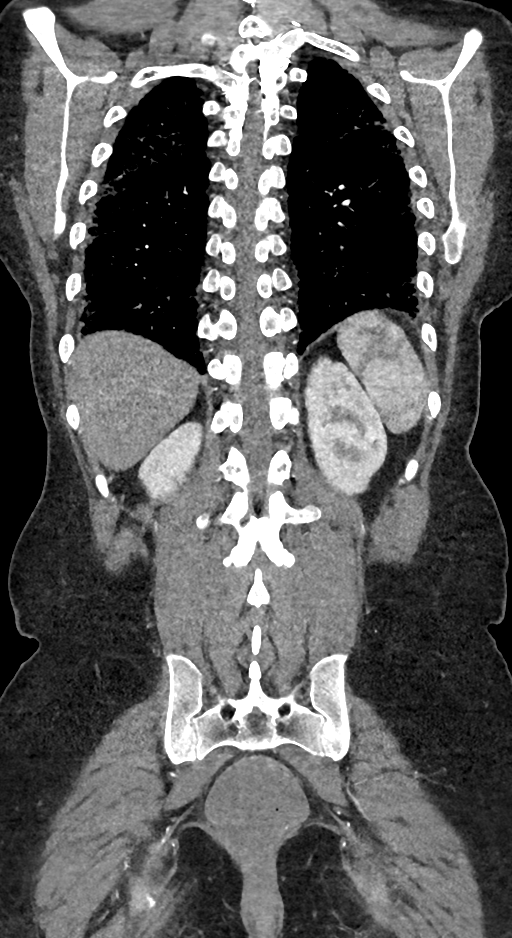

[12 of 46 positions shown; findings below may reference images not displayed]

FINDINGS: CTA CHEST FINDINGS

Cardiovascular: Preferential opacification of the thoracic aorta. No
evidence of thoracic aortic aneurysm or dissection. Main pulmonary
artery measures 3.5 cm. 3 lead pacemaker noted. Mild cardiomegaly.
No pericardial effusion. Satisfactory opacification of the pulmonary
arteries. No pulmonary embolus identified.

Mediastinum/Nodes: Stable paratracheal, hilar, AP window
lymphadenopathy measuring up to 13 x 17 mm in AP window (series 7,
image 47).

Lungs/Pleura: Stable diffuse reticulonodular interstitial disease
with ground-glass opacities greatest in the lower lobes in the
lingula is similar to prior studies. Interval resolution of right
upper lobe posterior segment consolidation. No new airspace opacity
identified. Stable subcentimeter nodule within the posterior basal
segment of left lower lobe (series 8, image 103). No pleural
effusion or pneumothorax.

Musculoskeletal: No acute fracture.

Review of the MIP images confirms the above findings.

CTA ABDOMEN AND PELVIS FINDINGS

VASCULAR

Aorta: Normal caliber aorta without aneurysm, dissection, vasculitis
or significant stenosis.

Celiac: Patent without evidence of aneurysm, dissection, vasculitis
or significant stenosis.

SMA: Patent without evidence of aneurysm, dissection, vasculitis or
significant stenosis.

Renals: Both renal arteries are patent without evidence of aneurysm,
dissection, vasculitis, fibromuscular dysplasia or significant
stenosis.

IMA: Patent without evidence of aneurysm, dissection, vasculitis or
significant stenosis.

Inflow: Patent without evidence of aneurysm, dissection, vasculitis
or significant stenosis.

Veins: No obvious venous abnormality within the limitations of this
arterial phase study.

Review of the MIP images confirms the above findings.

NON-VASCULAR

Hepatobiliary: Focal fatty deposition along falciform ligament is
stable. Cholecystectomy. No significant biliary ductal dilatation.

Pancreas: Unremarkable. No pancreatic ductal dilatation or
surrounding inflammatory changes.

Spleen: Normal in size without focal abnormality.

Adrenals/Urinary Tract: Adrenal glands are unremarkable. Kidneys are
normal, without renal calculi, focal lesion, or hydronephrosis.
Bladder is unremarkable.

Stomach/Bowel: Stomach is within normal limits. Appendix appears
normal. No evidence of bowel wall thickening, distention, or
inflammatory changes.

Lymphatic: No significant vascular findings are present. No enlarged
abdominal or pelvic lymph nodes.

Reproductive: Uterus and bilateral adnexa are unremarkable.

Other: No abdominal wall hernia or abnormality. No abdominopelvic
ascites.

Musculoskeletal: Sacralization of L5 vertebral body. Faint sclerosis
and femoral heads may represent mild avascular necrosis, no
subchondral fracture or collapse. T10 vertebral body segmentation
anomaly is stable. No acute fracture.

Review of the MIP images confirms the above findings.
IMPRESSION: 1. No acute process identified.  No acute aortic abnormality.
2. Stable chronic reticulonodular interstitial lung disease, likely
related to history of lupus. Interval resolution of right upper lobe
consolidation. Stable mediastinal adenopathy, likely reactive.
3. Enlarged main pulmonary artery may represent pulmonary artery
hypertension.

By: Anushka Berrocal M.D.

## 2020-03-10 IMAGING — US US PELVIS COMPLETE TRANSABD/TRANSVAG
1 series · 15 of 25 positions shown · non-contrast
Comparison: None

CLINICAL DATA: Abnormal uterine bleeding. Polycystic ovary
syndrome. Nexplanon.

EXAM:
TRANSABDOMINAL AND TRANSVAGINAL ULTRASOUND OF PELVIS
TECHNIQUE: Both transabdominal and transvaginal ultrasound examinations of the
pelvis were performed. Transabdominal technique was performed for
global imaging of the pelvis including uterus, ovaries, adnexal
regions, and pelvic cul-de-sac. It was necessary to proceed with
endovaginal exam following the transabdominal exam to visualize the
endometrial thickness and ovaries.

[Series 1: us pelvis complete transabd/transvag · 15 of 39 slices shown]
[im 1/39]
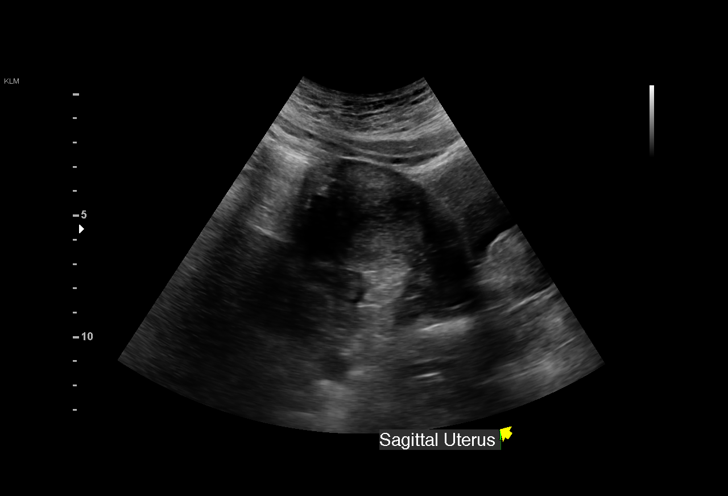
[im 4/39]
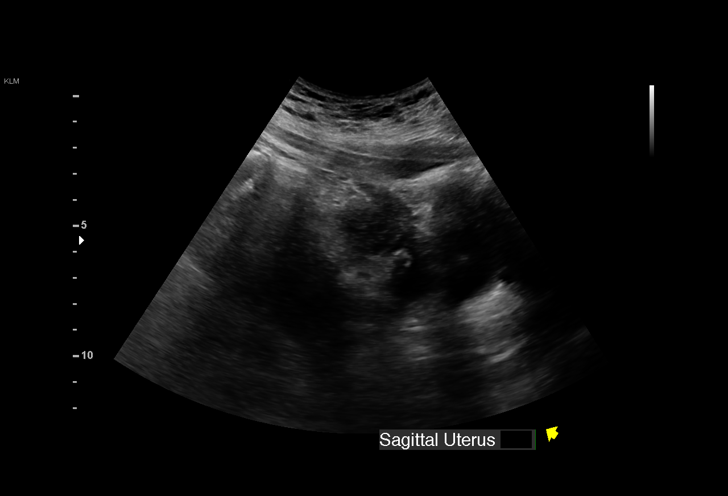
[im 7/39]
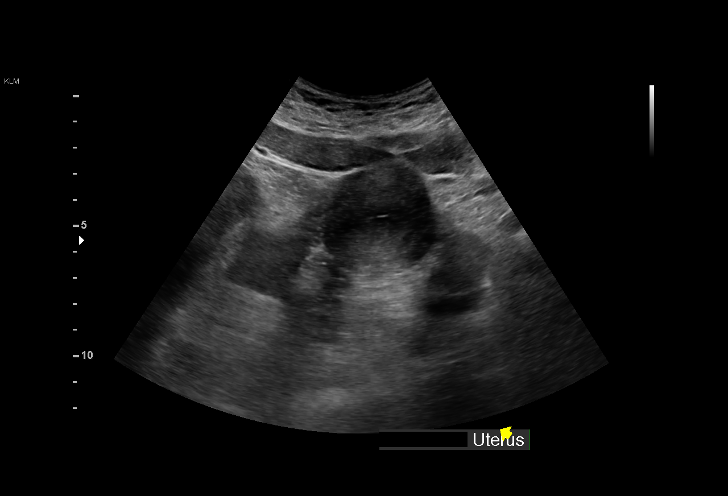
[im 8/39]
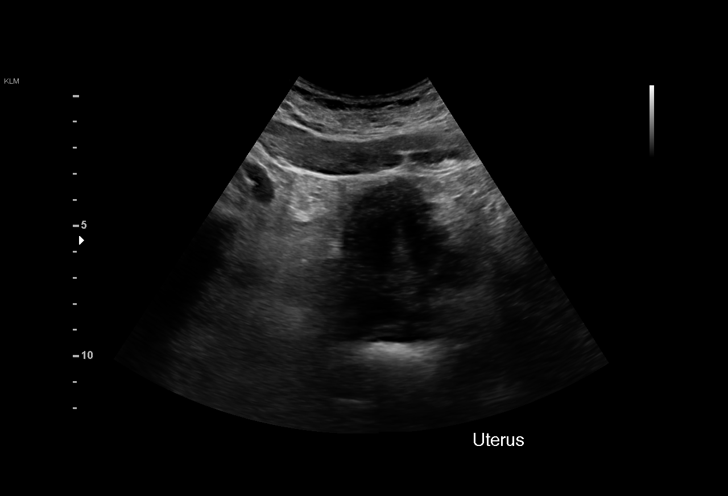
[im 12/39]
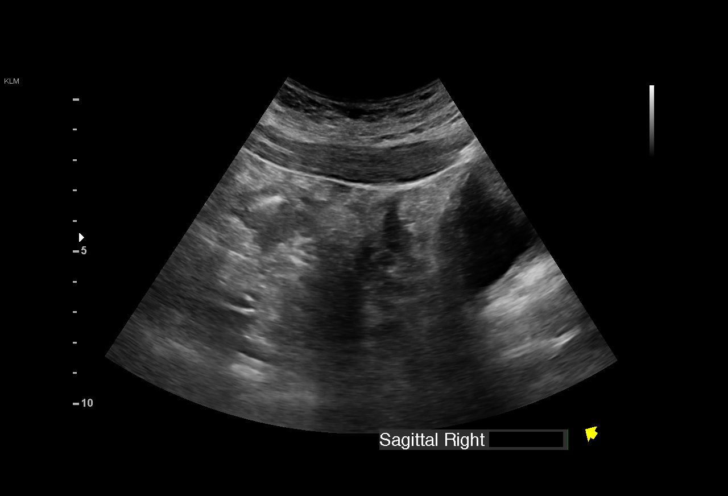
[im 15/39]
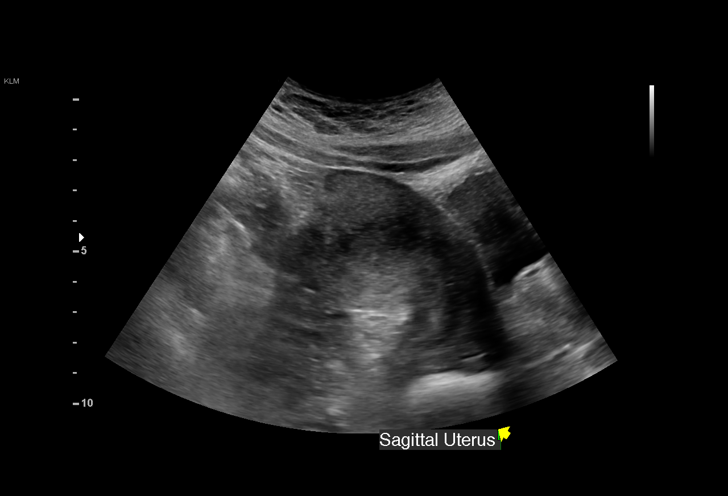
[im 16/39]
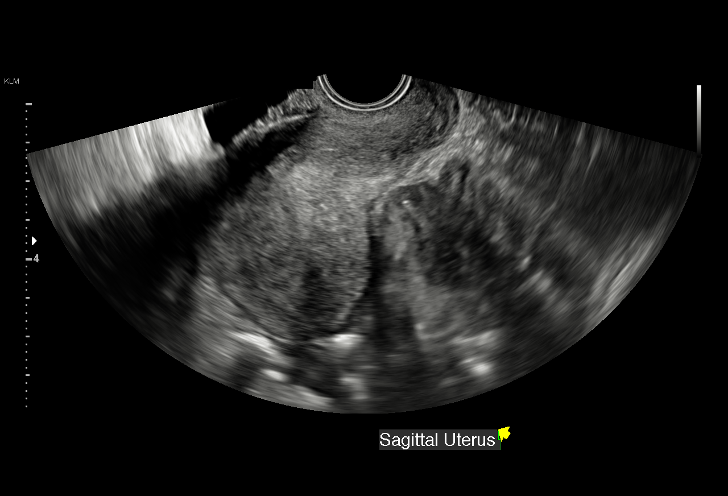
[im 20/39]
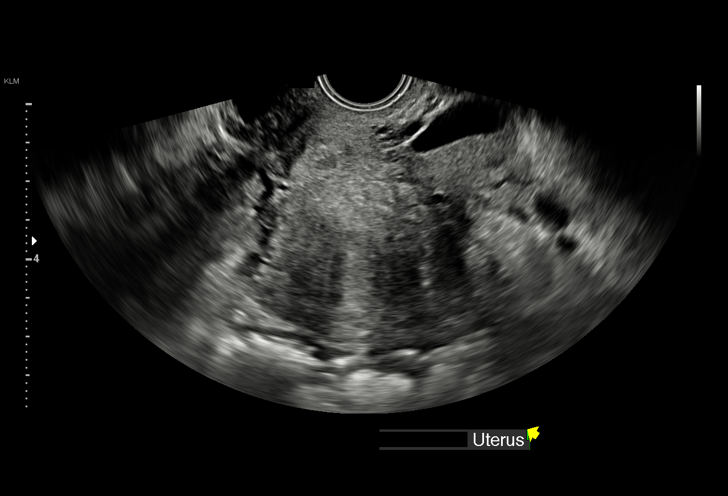
[im 23/39]
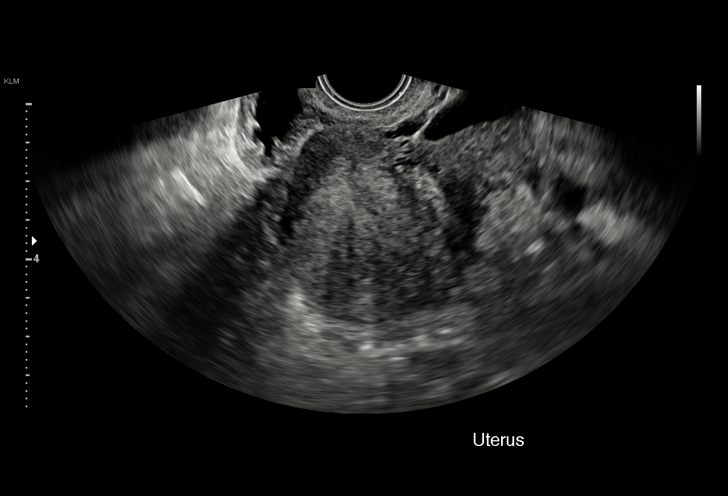
[im 24/39]
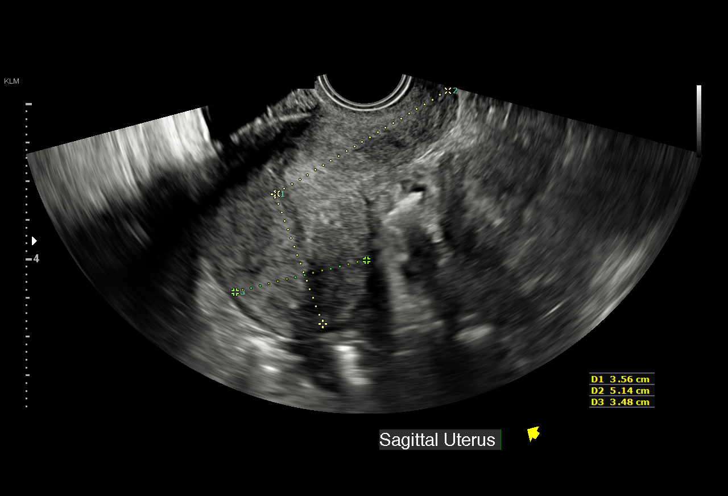
[im 27/39]
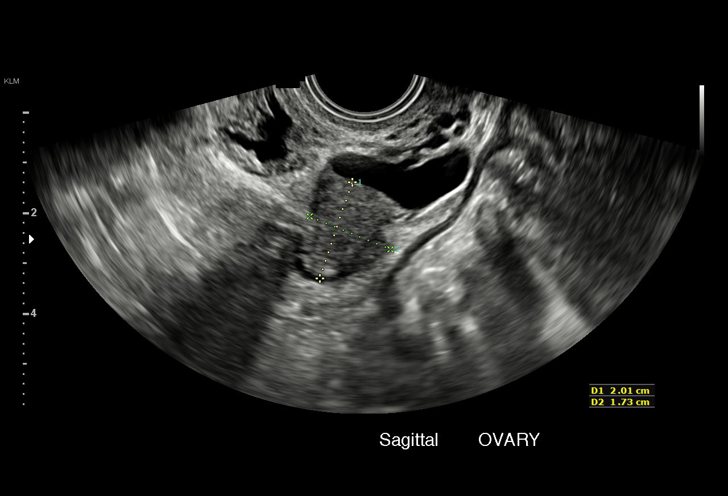
[im 31/39]
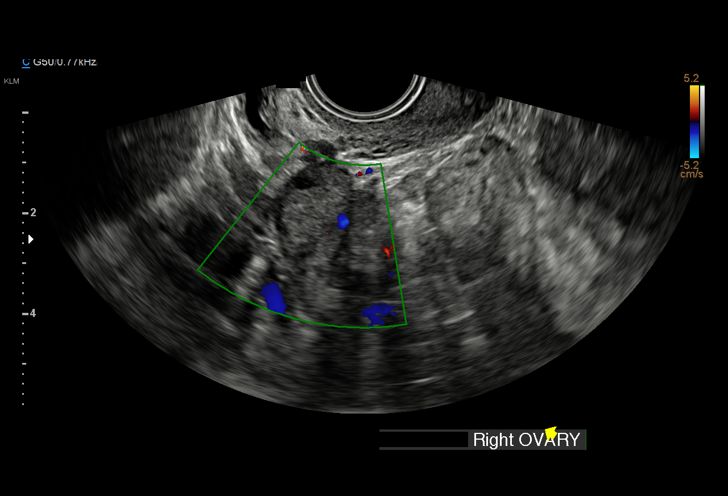
[im 32/39]
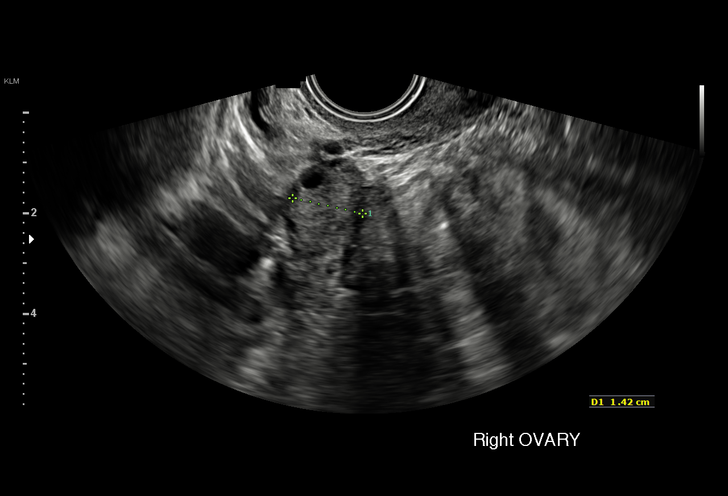
[im 35/39]
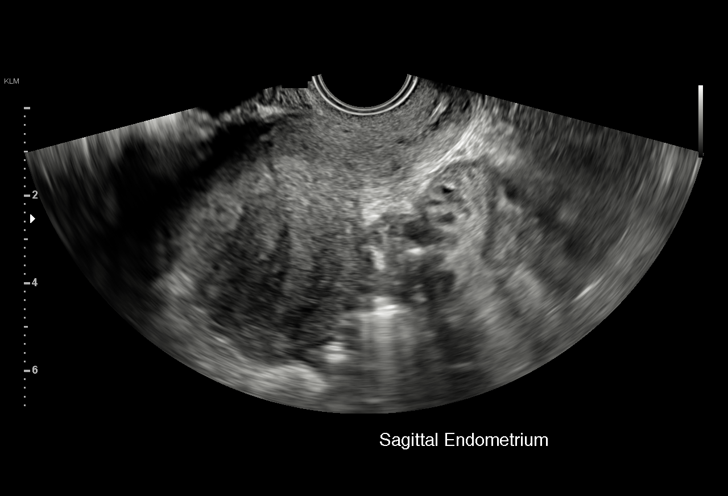
[im 39/39]
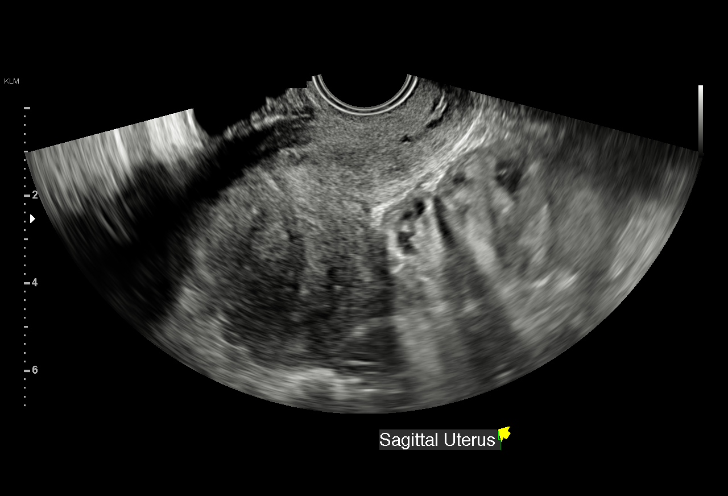

[15 of 25 positions shown; findings below may reference images not displayed]

FINDINGS: Uterus

Measurements: 8.7 x 3.5 x 4.3 cm. Retroflexed. No fibroids or other
mass visualized.

Endometrium

Thickness: 6 mm.  No focal abnormality visualized.

Right ovary

Measurements: 2.4 x 1.4 x 1.4 cm. A few tiny follicles are seen. No
ovarian or adnexal mass identified.

Left ovary

Measurements: 2.0 x 1.7 x 1.6 cm. A few tiny follicles are seen. An
ovoid cystic structure is seen along the inferior margin of the
ovary which measures 2.8 cm and has benign features. This could
represent a follicular cyst, paraovarian cyst, or small
hydrosalpinx.

Other findings

No abnormal free fluid.
IMPRESSION: 2.8 cm benign-appearing ovoid cystic structure along the inferior
margin of the left ovary. Differential diagnosis includes follicular
cyst, paraovarian cyst, or small hydrosalpinx.

Otherwise unremarkable exam.

## 2020-03-18 IMAGING — CR DG CHEST 2V
2 series · 2 of 2 positions shown · non-contrast
Comparison: Chest x-ray of December 01, 2017 and chest CT scan of
the same day.

CLINICAL DATA: Chronic CHF, asthma, interstitial lung disease.
Patient reports shortness of breath.

EXAM:
CHEST - 2 VIEW

[chest ap]
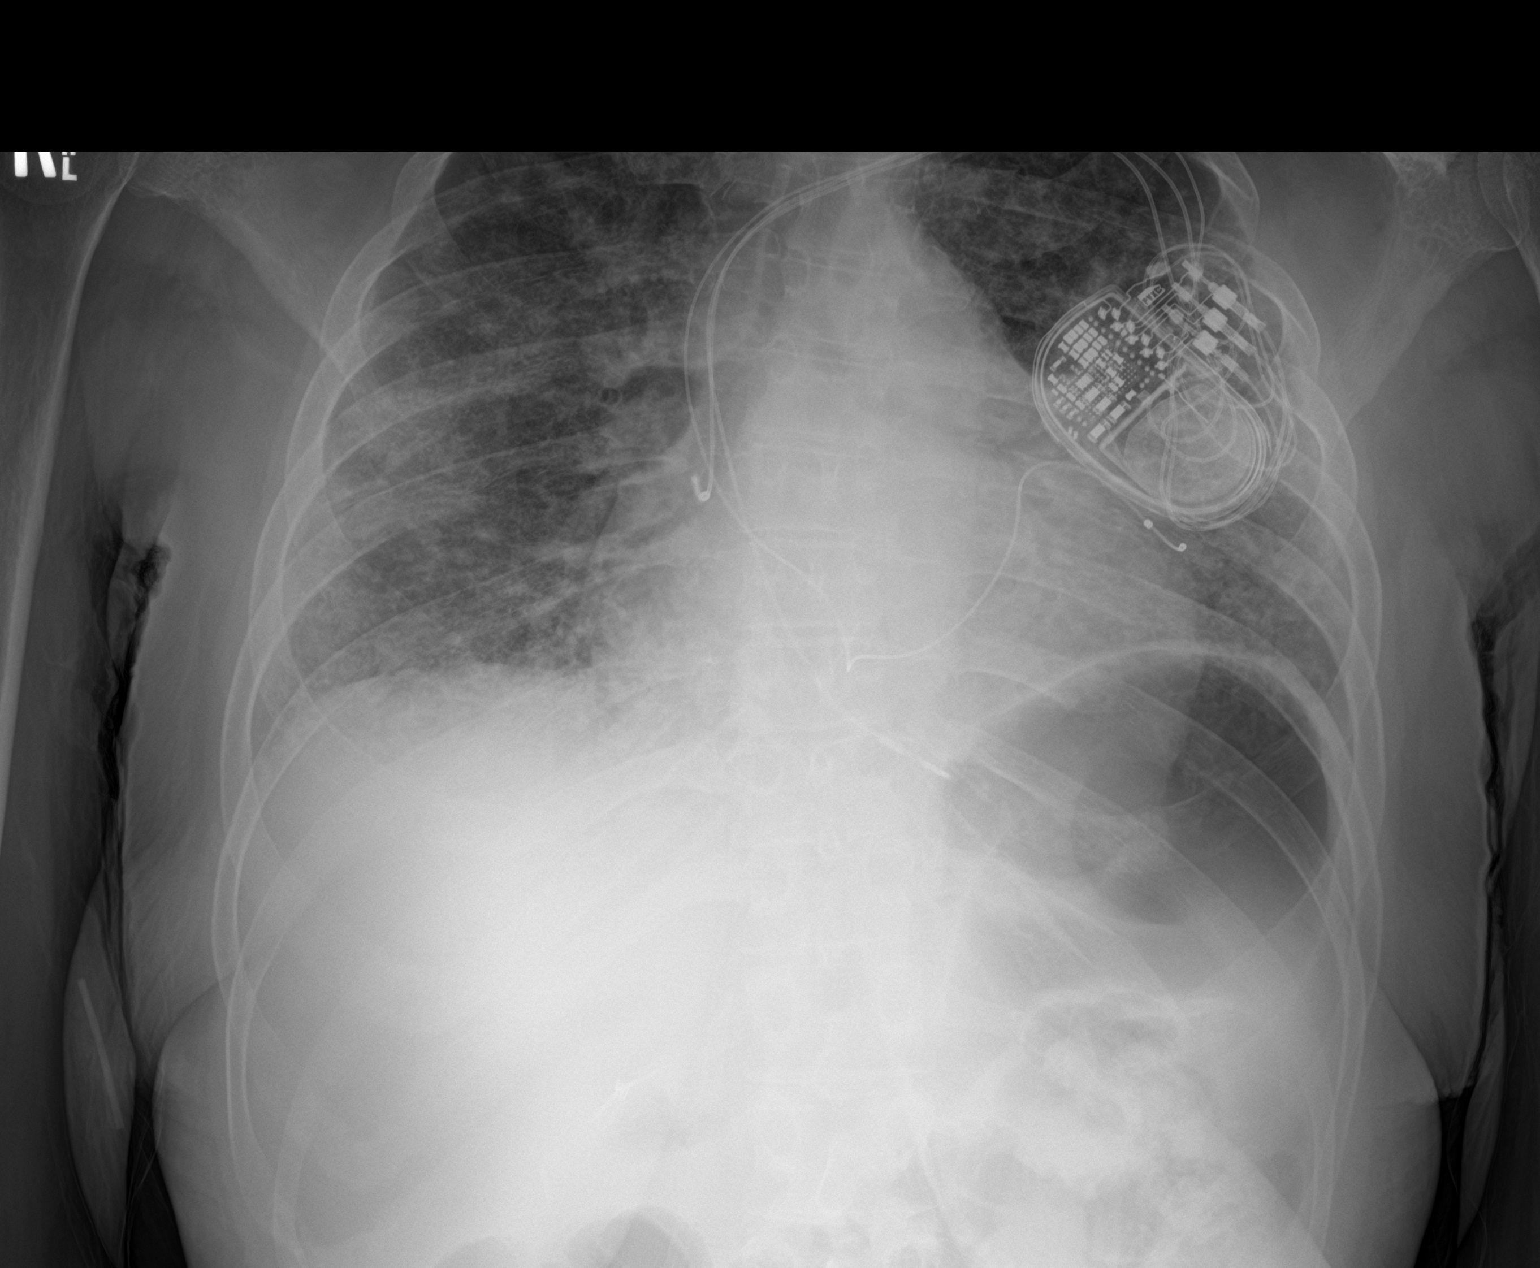

[chest lat]
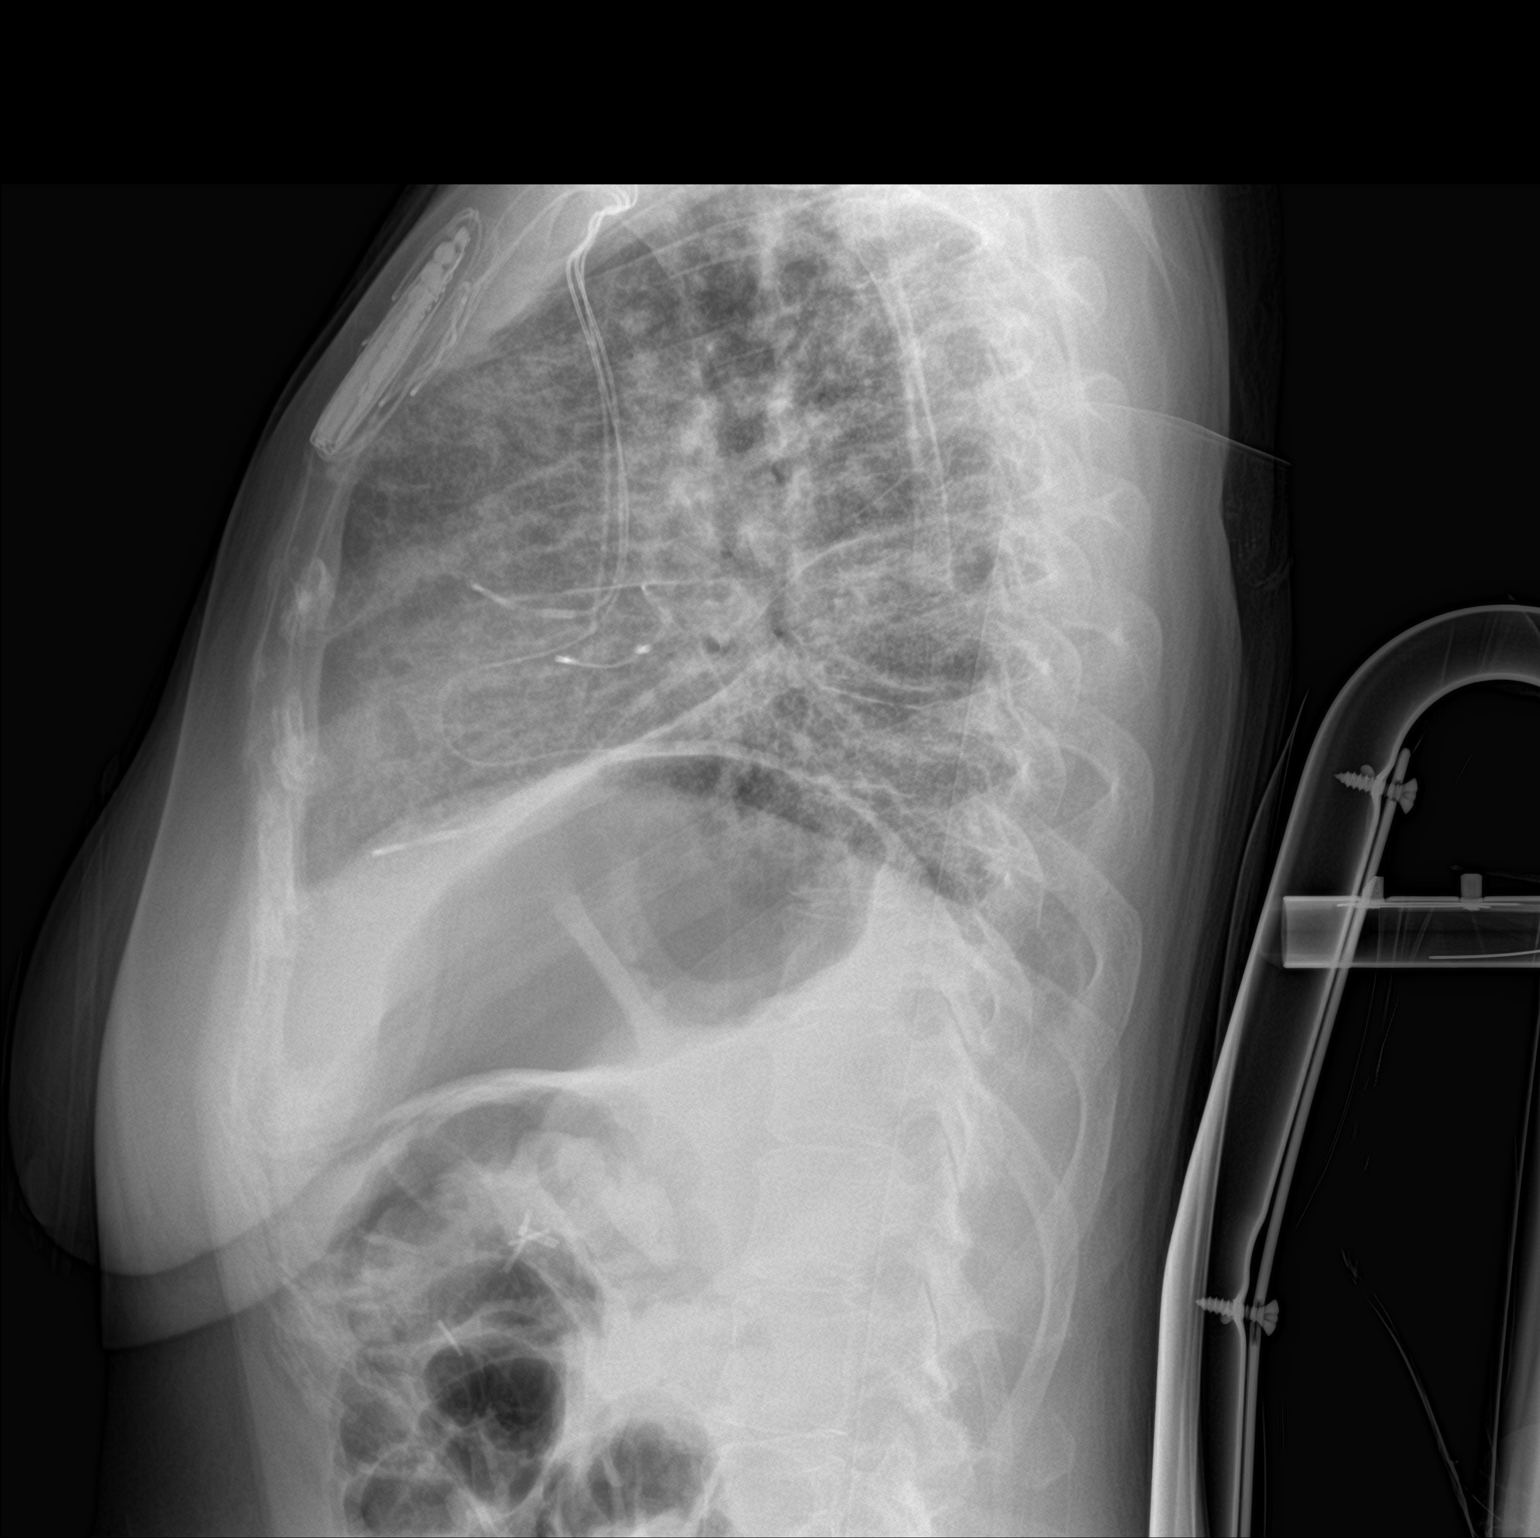

[2 of 2 positions shown; findings below may reference images not displayed]

FINDINGS: The lungs are adequately inflated. The interstitial markings remain
increased in a reticulonodular pattern. There is no alveolar
infiltrate or pleural effusion. The cardiac silhouette is enlarged.
The pulmonary vascularity is engorged though stable. There is a
stable appearance of the ICD.
IMPRESSION: Chronic cardiomegaly and central pulmonary vascular prominence.
Chronic reticulonodular interstitial changes. No acute
cardiopulmonary abnormality.

## 2020-05-01 IMAGING — CT CT ANGIO CHEST
2 of 7 series · 18 of 46 positions shown · IV contrast (APPLIED)
Comparison: 12/01/2017 CT, 02/22/2016 CT, 04/07/2018 CXR

CLINICAL DATA: Dyspnea and tachycardia x2 weeks with cough.

EXAM:
CT ANGIOGRAPHY CHEST WITH CONTRAST
TECHNIQUE: Multidetector CT imaging of the chest was performed using the
standard protocol during bolus administration of intravenous
contrast. Multiplanar CT image reconstructions and MIPs were
obtained to evaluate the vascular anatomy.
CONTRAST:  100mL [id number] IOPAMIDOL ([id number]) INJECTION 76%

[Series 7: thins · axial · 0.61mm/px · z∈[+126,+339]mm · 15 of 343 slices shown]
[im 20/343  lung]
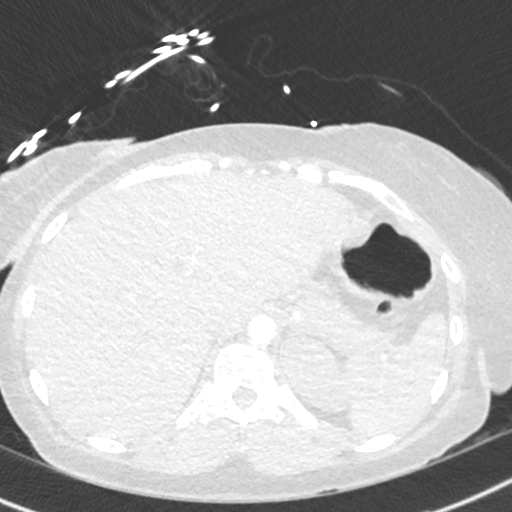
[im 39/343  soft-tissue]
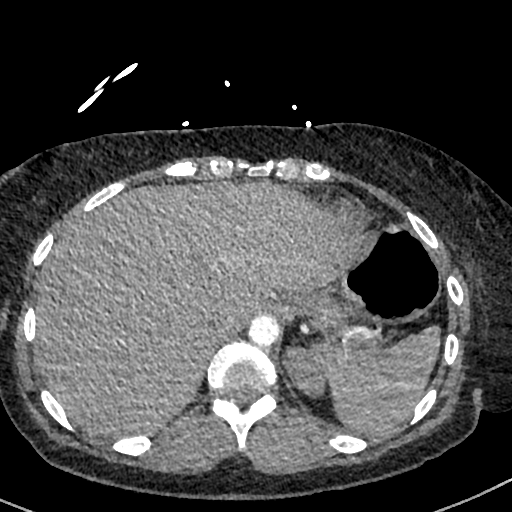
[im 58/343  lung]
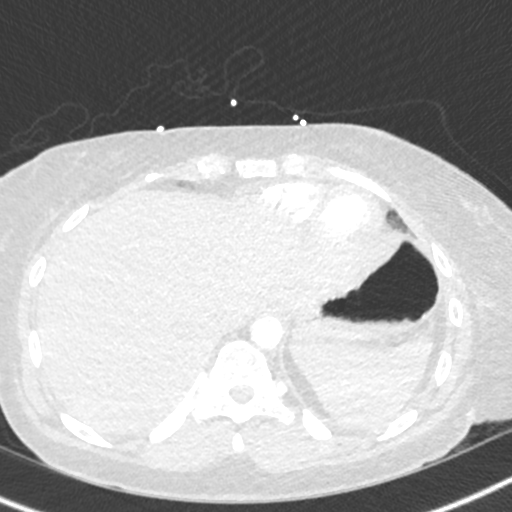
[im 77/343  soft-tissue]
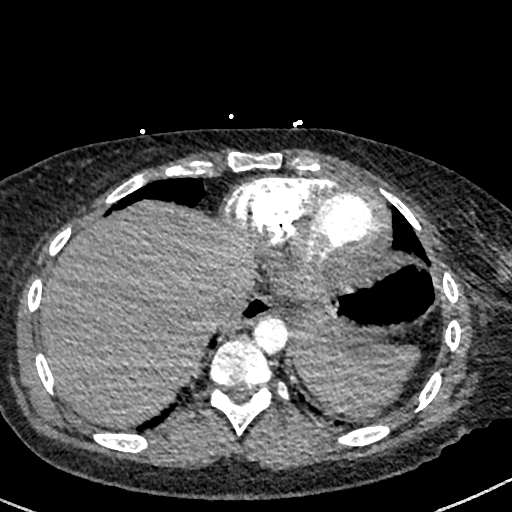
[im 115/343  lung]
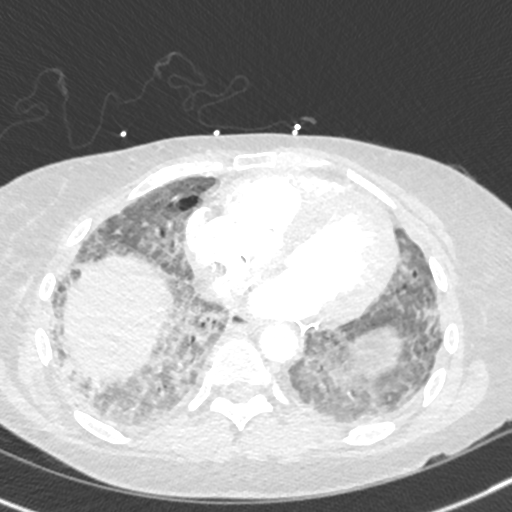
[im 134/343  soft-tissue]
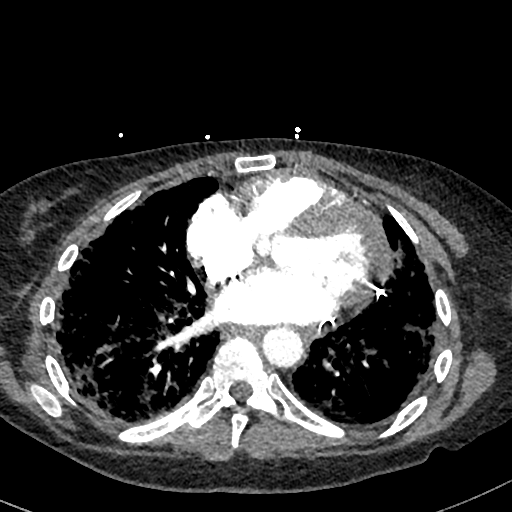
[im 153/343  lung]
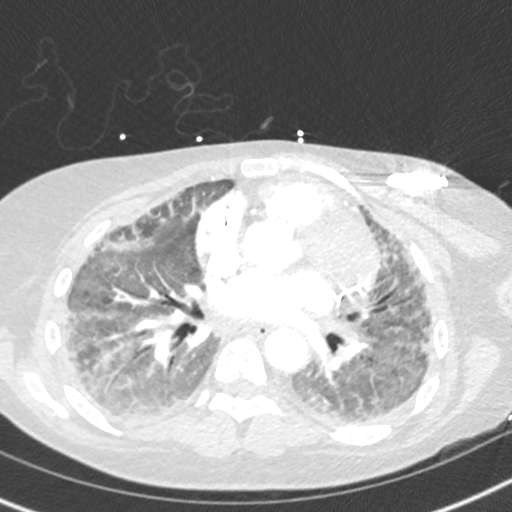
[im 172/343  soft-tissue]
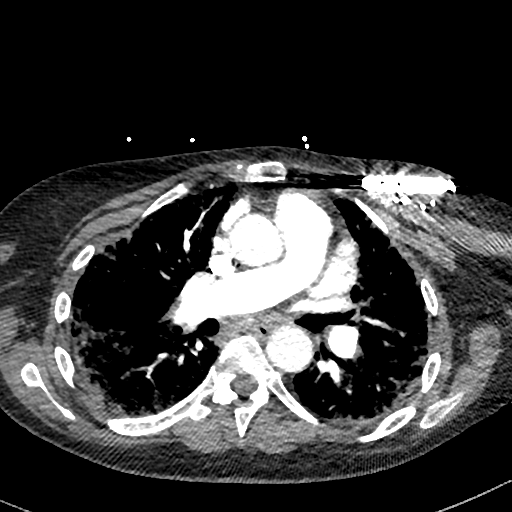
[im 191/343  lung]
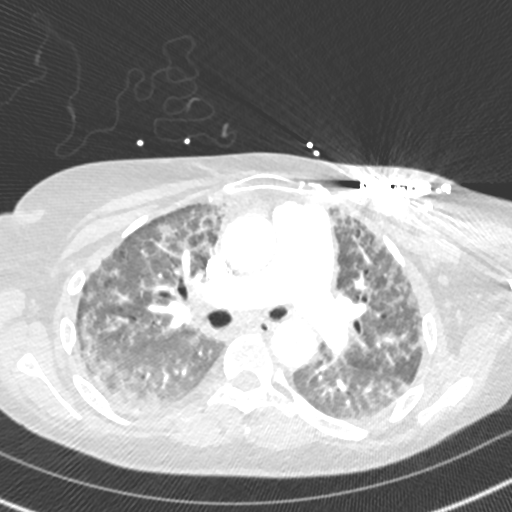
[im 210/343  soft-tissue]
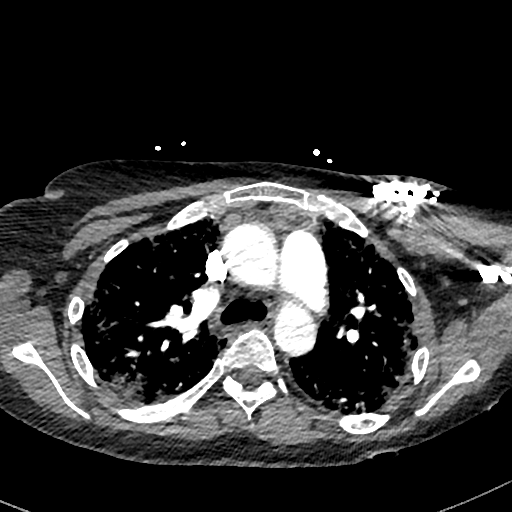
[im 229/343  lung]
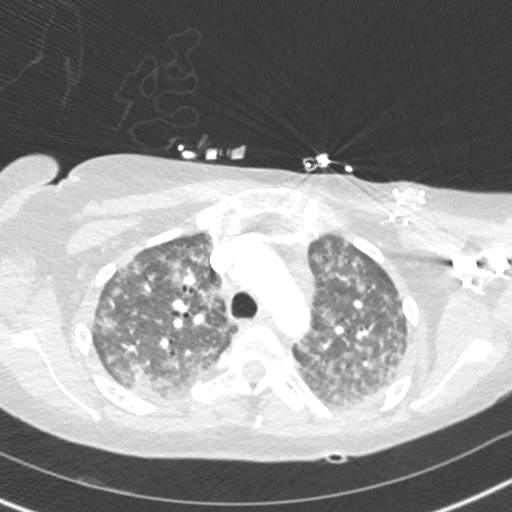
[im 267/343  soft-tissue]
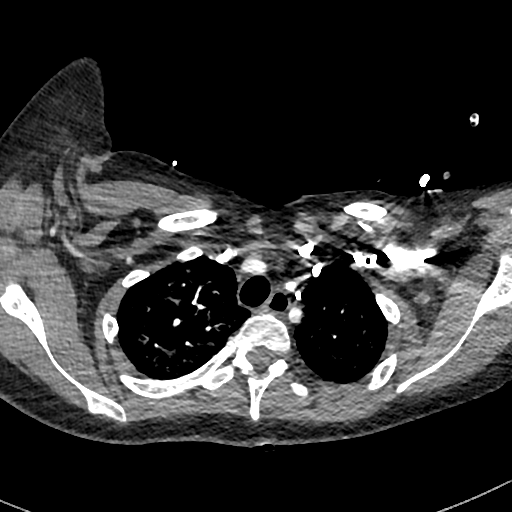
[im 286/343  lung]
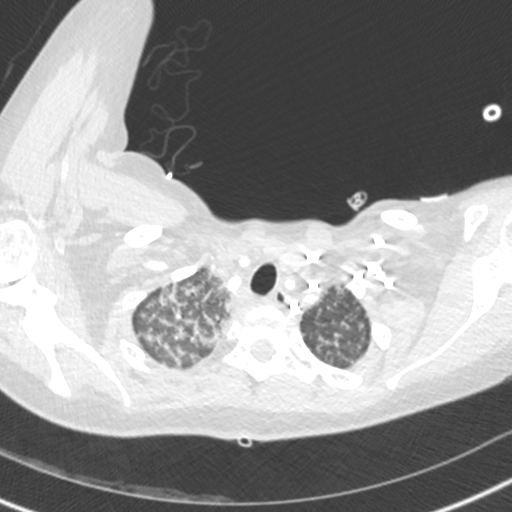
[im 305/343  soft-tissue]
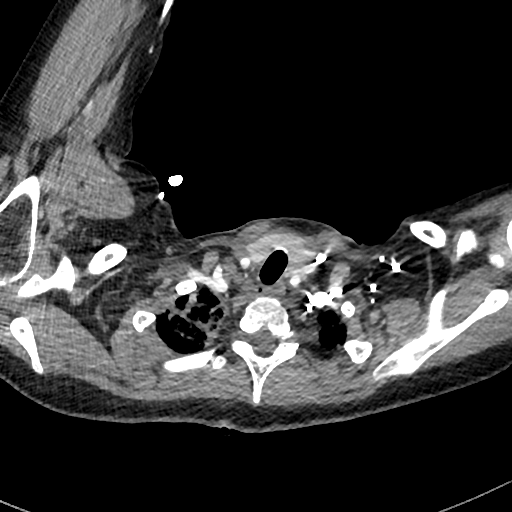
[im 324/343  lung]
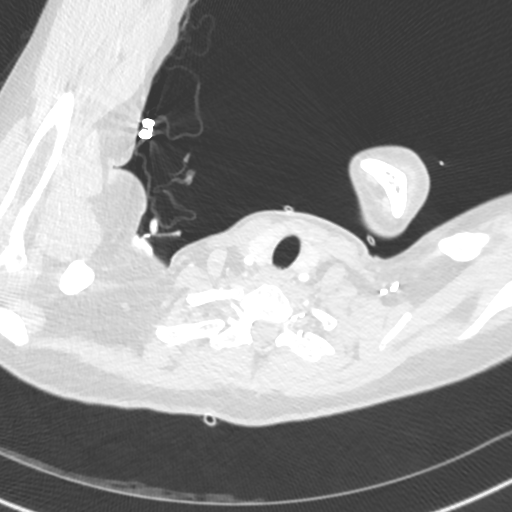

[Series 8: cor · coronal · 0.59mm/px · 3 of 151 slices shown]
[im 38/151  soft-tissue]
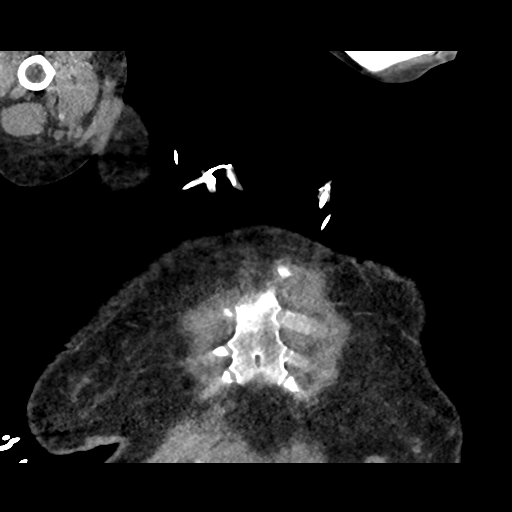
[im 76/151  soft-tissue]
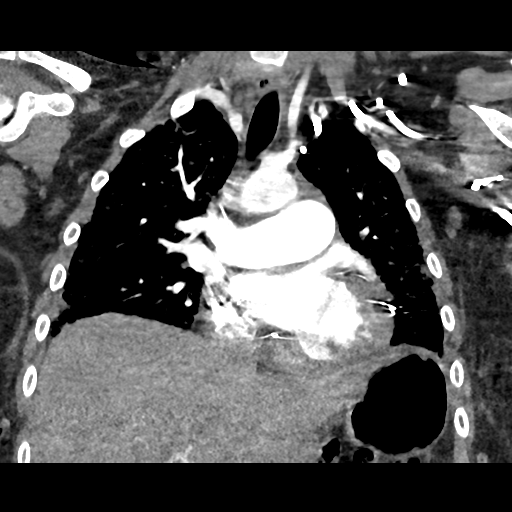
[im 113/151  soft-tissue]
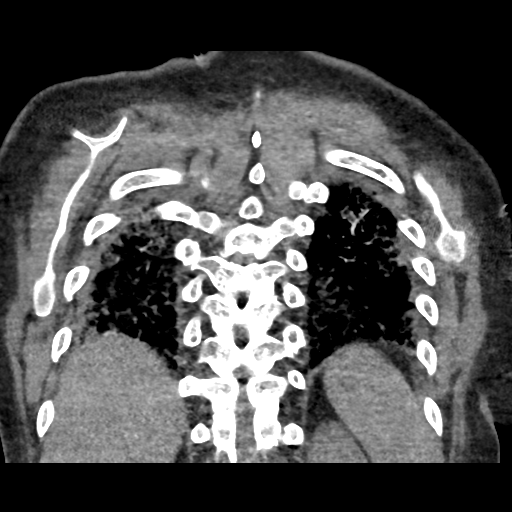

[18 of 46 positions shown; findings below may reference images not displayed]

FINDINGS: Cardiovascular: Stable cardiomegaly. Dilatation of the main
pulmonary artery to 3.5 cm compatible with chronic pulmonary
hypertension. Satisfactory opacification of the pulmonary arteries
the segmental level without pulmonary embolus. Nonaneurysmal
atherosclerotic aorta. No pericardial effusion or thickening. Right
atrial, coronary sinus and right ventricular pacing leads noted with
anterior left chest wall pacemaker apparatus noted.

Mediastinum/Nodes: The visualized thyroid gland is unremarkable.
Prevascular and paratracheal mildly enlarged lymph nodes are noted
up to 1.1 cm short axis, nonspecific but may represent reactive
adenopathy. No axillary or hilar adenopathy. Patent trachea and
mainstem bronchi. Nondistended esophagus.

Lungs/Pleura: Redemonstration of diffuse reticulonodular
interstitial lung disease with superimposed multiple ground-glass
opacities throughout both lungs. Subpleural Kerley B lines are
suggested as well. Findings would be in keeping with a degree of
chronic congestive heart failure. Superimposed pneumonia would be
difficult to entirely exclude. Stable 6 mm left lower lobe pulmonary
nodule dating back to 2222.

Upper Abdomen: No acute abnormality.

Musculoskeletal: No aggressive osseous lesions.

Review of the MIP images confirms the above findings.
IMPRESSION: 1. Redemonstration of cardiomegaly with diffuse pulmonary vascular
congestion, interstitial edema and superimposed ground-glass
opacities. Findings likely reflect component of chronic CHF.
Superimposed pneumonia would be difficult to entirely exclude.
2. Stable 6 mm left lower lobe pulmonary nodule dating back to 2222
favoring a benign etiology.
3. Dilated main pulmonary artery consistent with chronic pulmonary
hypertension. No acute pulmonary embolus.

## 2020-06-20 NOTE — Death Summary Note (Signed)
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 MSSA bacteremia Acute on chronic heart failure with reduced ejection fraction EF 35 to 40%, with elevated RVSP  Lactic acidosis     SUMMARY Makayla Vasquez was 39 y.o. patient with    has a past medical history of Anxiety, Arthritis, Asthma, Asymptomatic LV dysfunction, Bipolar affective disorder [facility]), Cardiac pacemaker, Carpal tunnel syndrome of right wrist, CHF (congestive heart failure) (HCC), Congenital complete AV block, Cor pulmonale, chronic (HCC) (04/08/2018), Depression, Diabetes mellitus without complication (HCC), GERD (gastroesophageal reflux disease), Hypertension, Interstitial lung disease (HCC), Lupus (HCC), Lupus (systemic lupus erythematosus) (HCC), Obesity, Pneumonitis, Presence of permanent cardiac pacemaker, Seizures (HCC), and Sinus tachycardia.   has a past surgical history that includes Throat surgery (1994); Cesarean section; Insert / replace / remove pacemaker; Cholecystectomy; IUD removal (11/03/2011); Cyst excision (12/10/2012); Thyroglossal duct cyst (N/A, 12/10/2012); Nasal fracture surgery; Carpal tunnel with cubital tunnel (Right, 07/26/2013); Video bronchoscopy (Bilateral, 06/19/2014); bi-ventricular pacemaker upgrade (N/A, 03/08/2012); Atrial tach ablation (N/A, 08/14/2014); right heart catheterization (N/A, 10/26/2014); and TEE without cardioversion (N/A, 10/31/2019).   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  Recent hospital stay for COVID-19 pneumonia treated with steroids and remdesivir.  Was discharged on her previous baseline 2 to 4 L nasal cannula.  Her immunosuppressant medications were held at discharge on 1/24 including CellCept and tacrolimus.  Her home prednisone had been increased.  She was further being treated with Ancef and rifampin for MSSA bacteremia, the source was thought to be a PICC line which was removed before discharge on 1/24.  A new PICC line was placed.  Patient came in with dyspnea on exertion and on her baseline 2LNC. Per ED nurse having worsening cough with desaturation which failed to improve with non rebreather and heated high flow. Intubated for hypoxemic respiratory failure. Was having fevers.   Then morning of admission - COURSE   Dr Kalman Shan and APP Karin Lieu of cCM - location [place] ER bed 18. We were in dictation room at [facility] longER. Called by nursing staff emergenttly to bedside for code on this patient previously seen by CCM. Known low EF 35% 10/31/2019. She did not survive CPR and died 07-Dec-2019       SIGNED Dr. Kalman Shan, M.D., F.C.C.P Pulmonary and Critical Care Medicine Staff Physician [facility] Pager: [phone], If no answer or between  15:00h - 7:00h: call [phone]  06/06/2020 11:02 AM

## 2020-07-05 IMAGING — DX DG CHEST 2V
2 series · 2 of 2 positions shown · non-contrast
Comparison: Chest CT 04/08/2018 and chest radiograph 02/23/2018

CLINICAL DATA: Productive cough for 10 days.

EXAM:
CHEST - 2 VIEW

[chest pa]
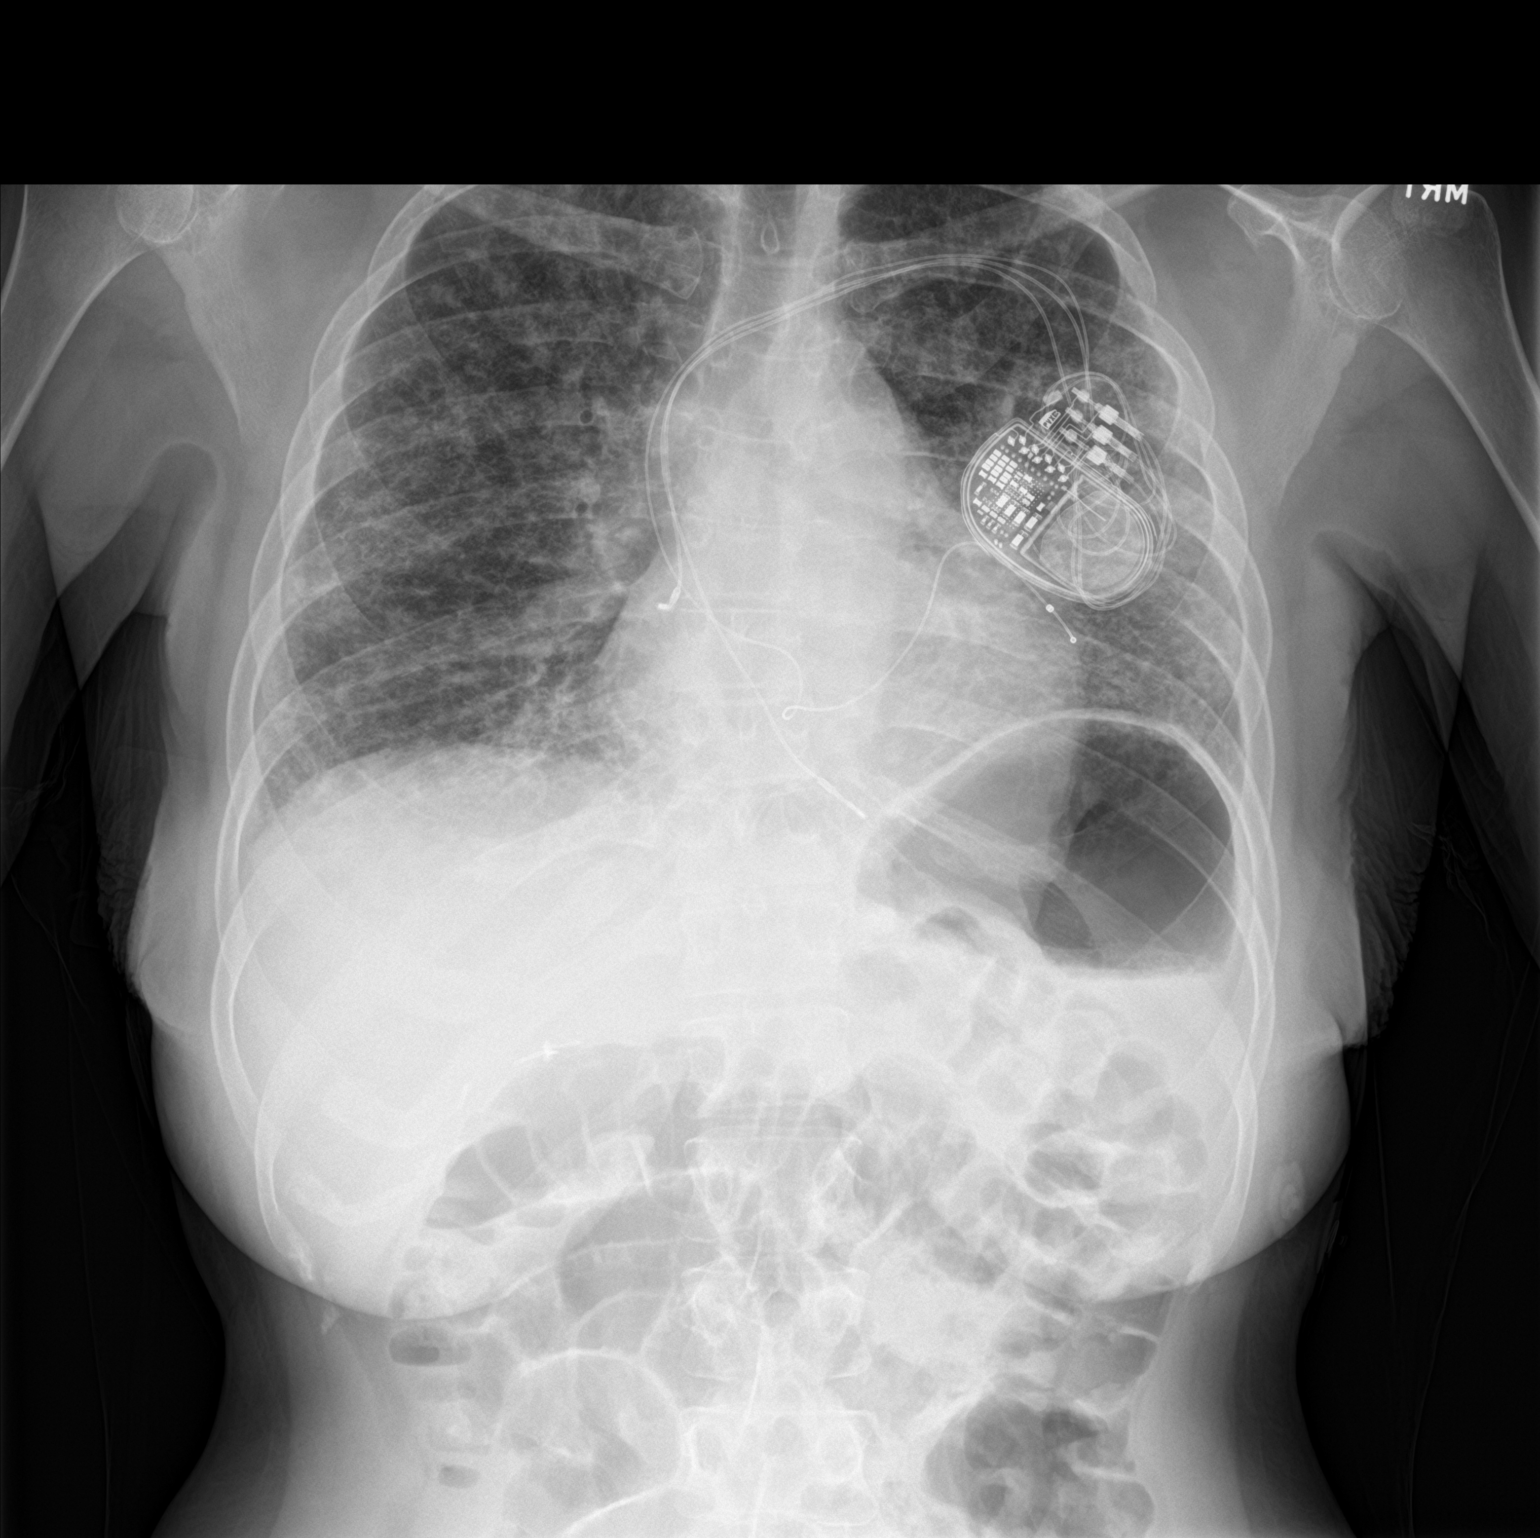

[chest lat]
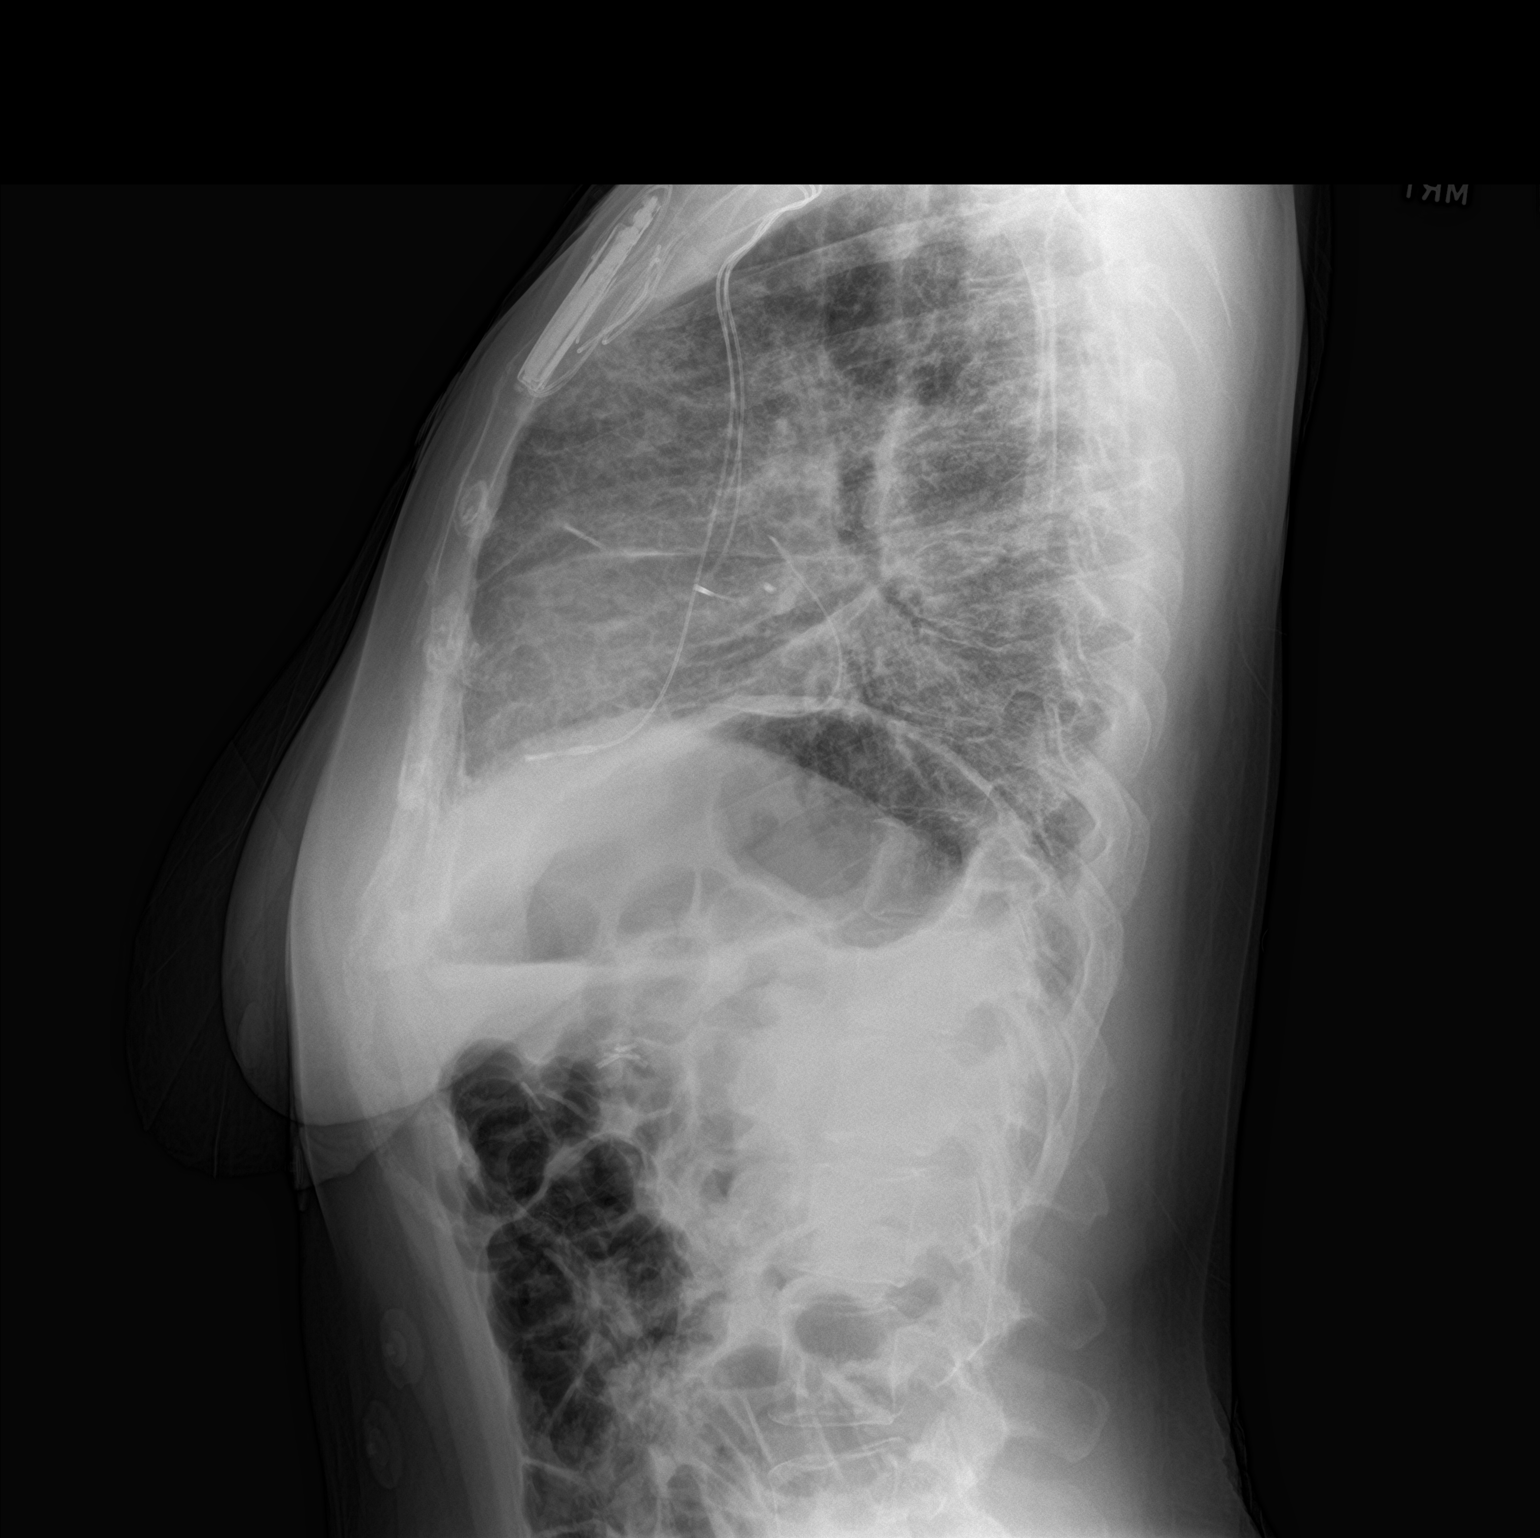

[2 of 2 positions shown; findings below may reference images not displayed]

FINDINGS: Diffuse parenchymal lung densities are unchanged from the most
recent comparison imaging. Heart size is within normal limits and
stable. Again noted is a biventricular cardiac pacemaker. No large
pleural effusions. Bony structures are unremarkable. Negative for a
pneumothorax.
IMPRESSION: Chronic parenchymal lung densities bilaterally. No significant
change from the recent comparison imaging.

Stable appearance of the cardiac pacemaker.

## 2020-08-06 IMAGING — DX DG CHEST 2V
2 series · 2 of 2 positions shown · non-contrast
Comparison: 06/12/2018 and CT chest 04/08/2018.

CLINICAL DATA: Shortness of breath with cough, dizziness and
lightheadedness.

EXAM:
CHEST - 2 VIEW

[chest pa]
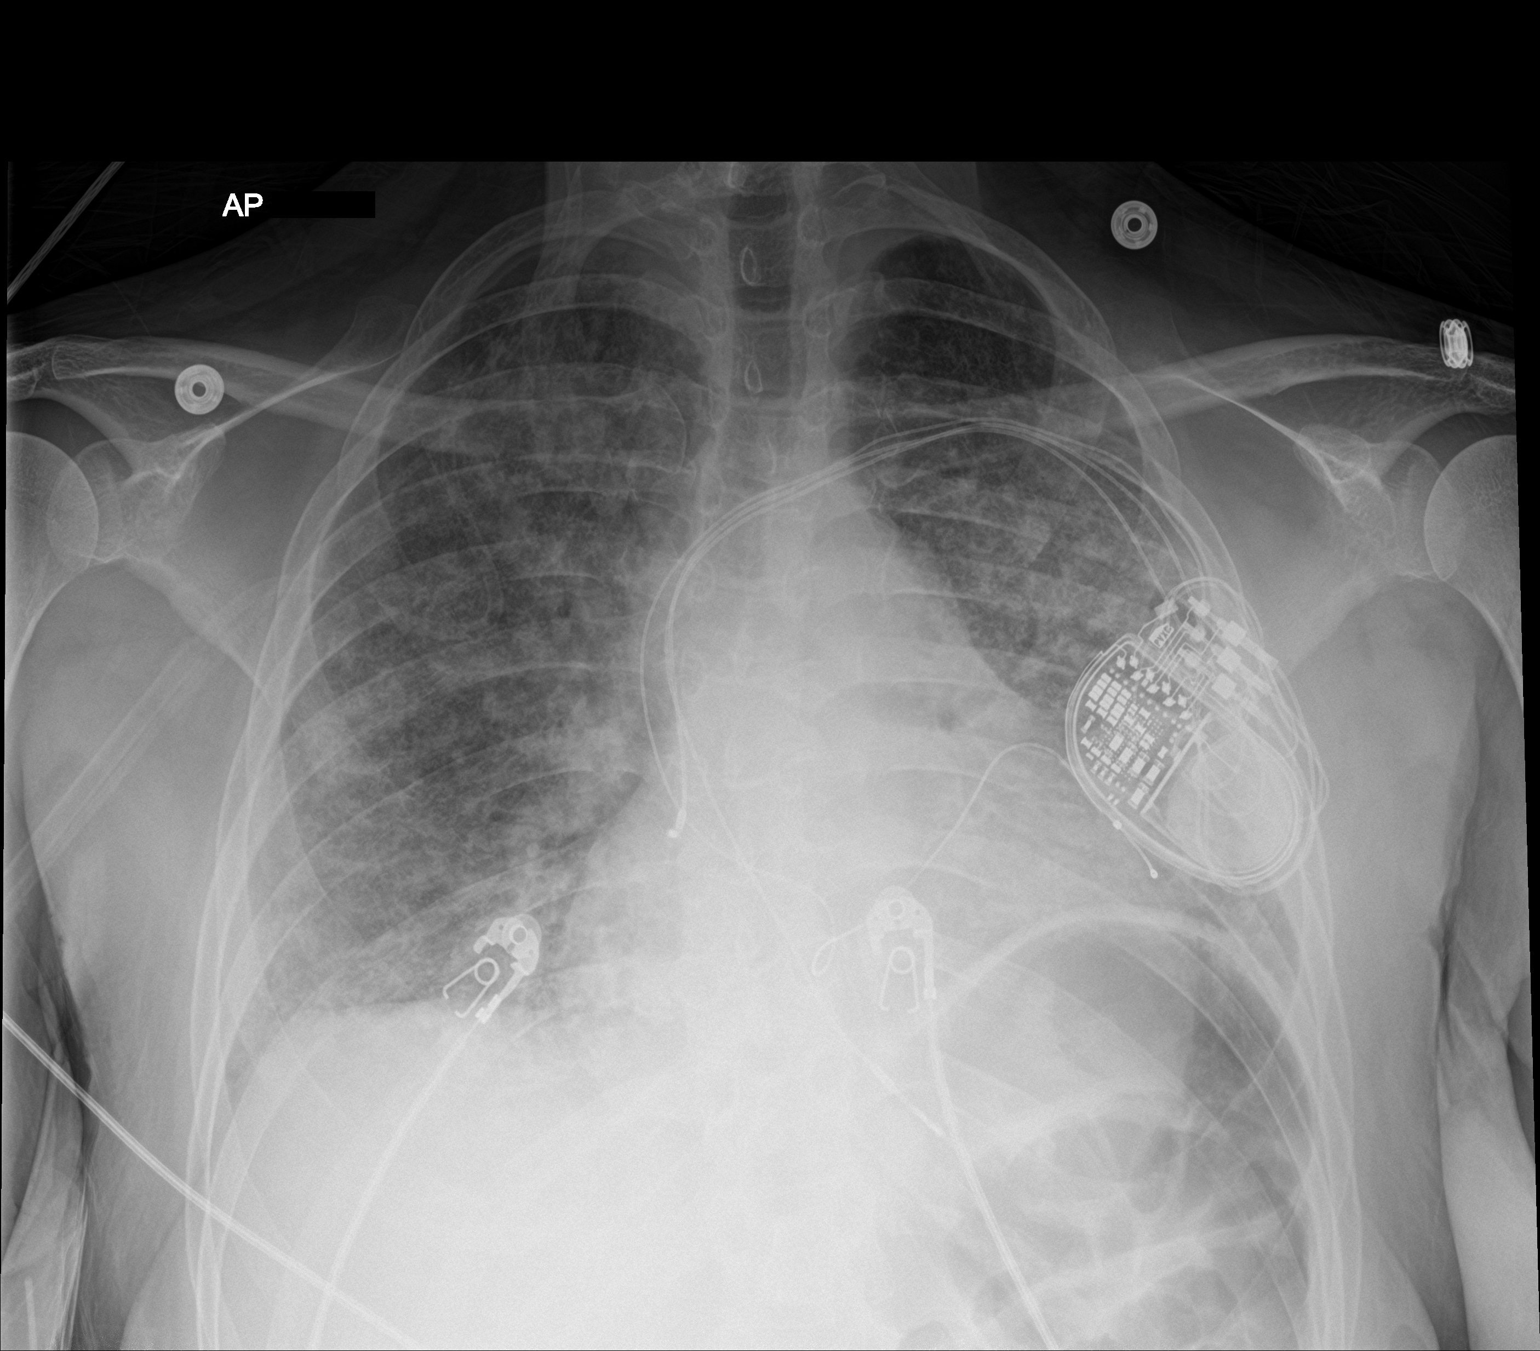

[chest lat]
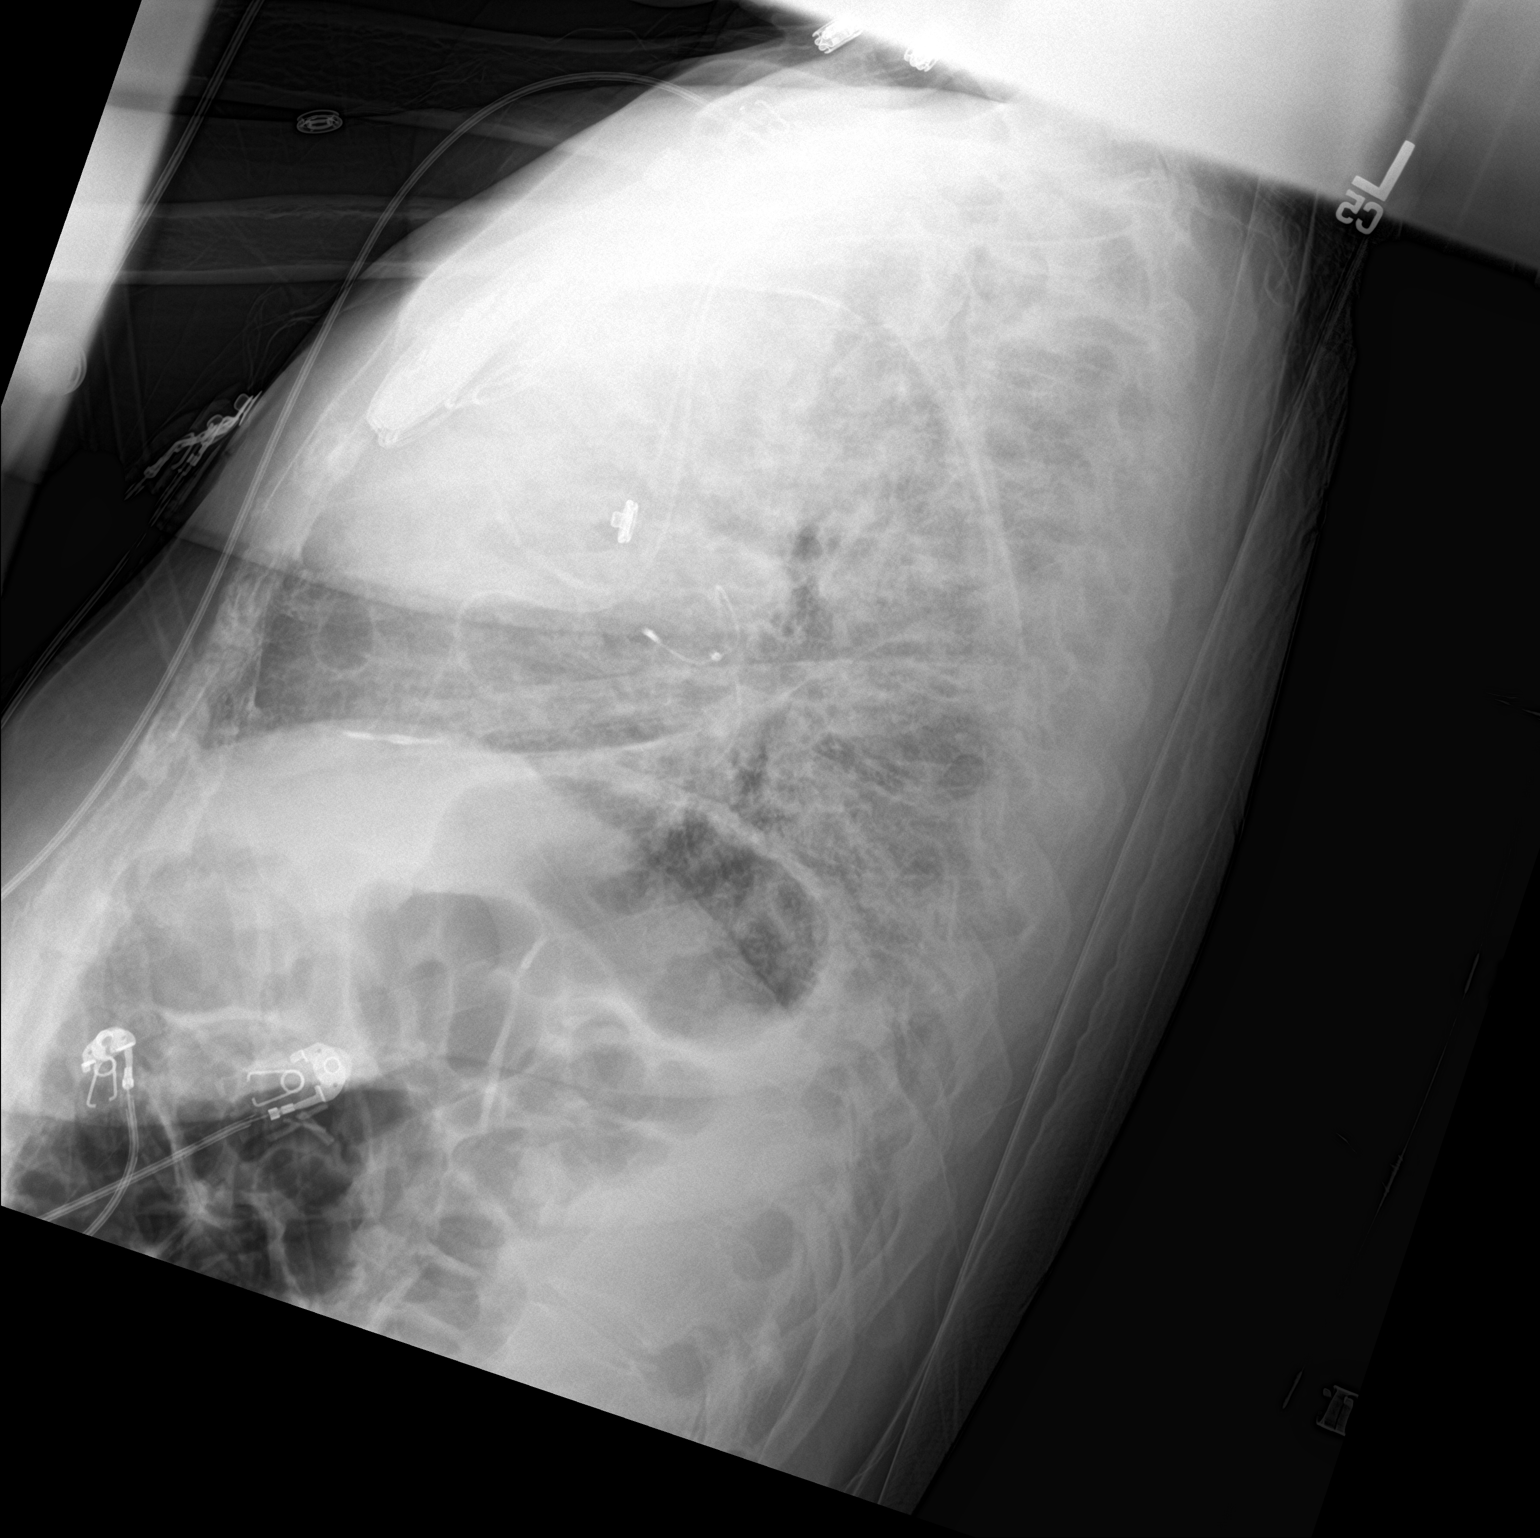

[2 of 2 positions shown; findings below may reference images not displayed]

FINDINGS: Trachea is midline. Pacemaker lead tips project over the right
atrium, right ventricle and coronary sinus. Heart is enlarged. Mild
diffuse mixed interstitial and airspace opacification appears
slightly worsened from 06/12/2018. No definite pleural fluid.
IMPRESSION: Suspect pulmonary edema superimposed on chronic interstitial lung
disease.

## 2020-08-16 IMAGING — DX DG CHEST 2V
2 series · 2 of 2 positions shown · non-contrast
Comparison: PA and lateral chest 07/14/2018, 06/12/2018 and
12/01/2017. CT chest 04/08/2017.

CLINICAL DATA: Cough, shortness of breath and fever in a patient
with a history of interstitial lung disease.

EXAM:
CHEST - 2 VIEW

[chest pa]
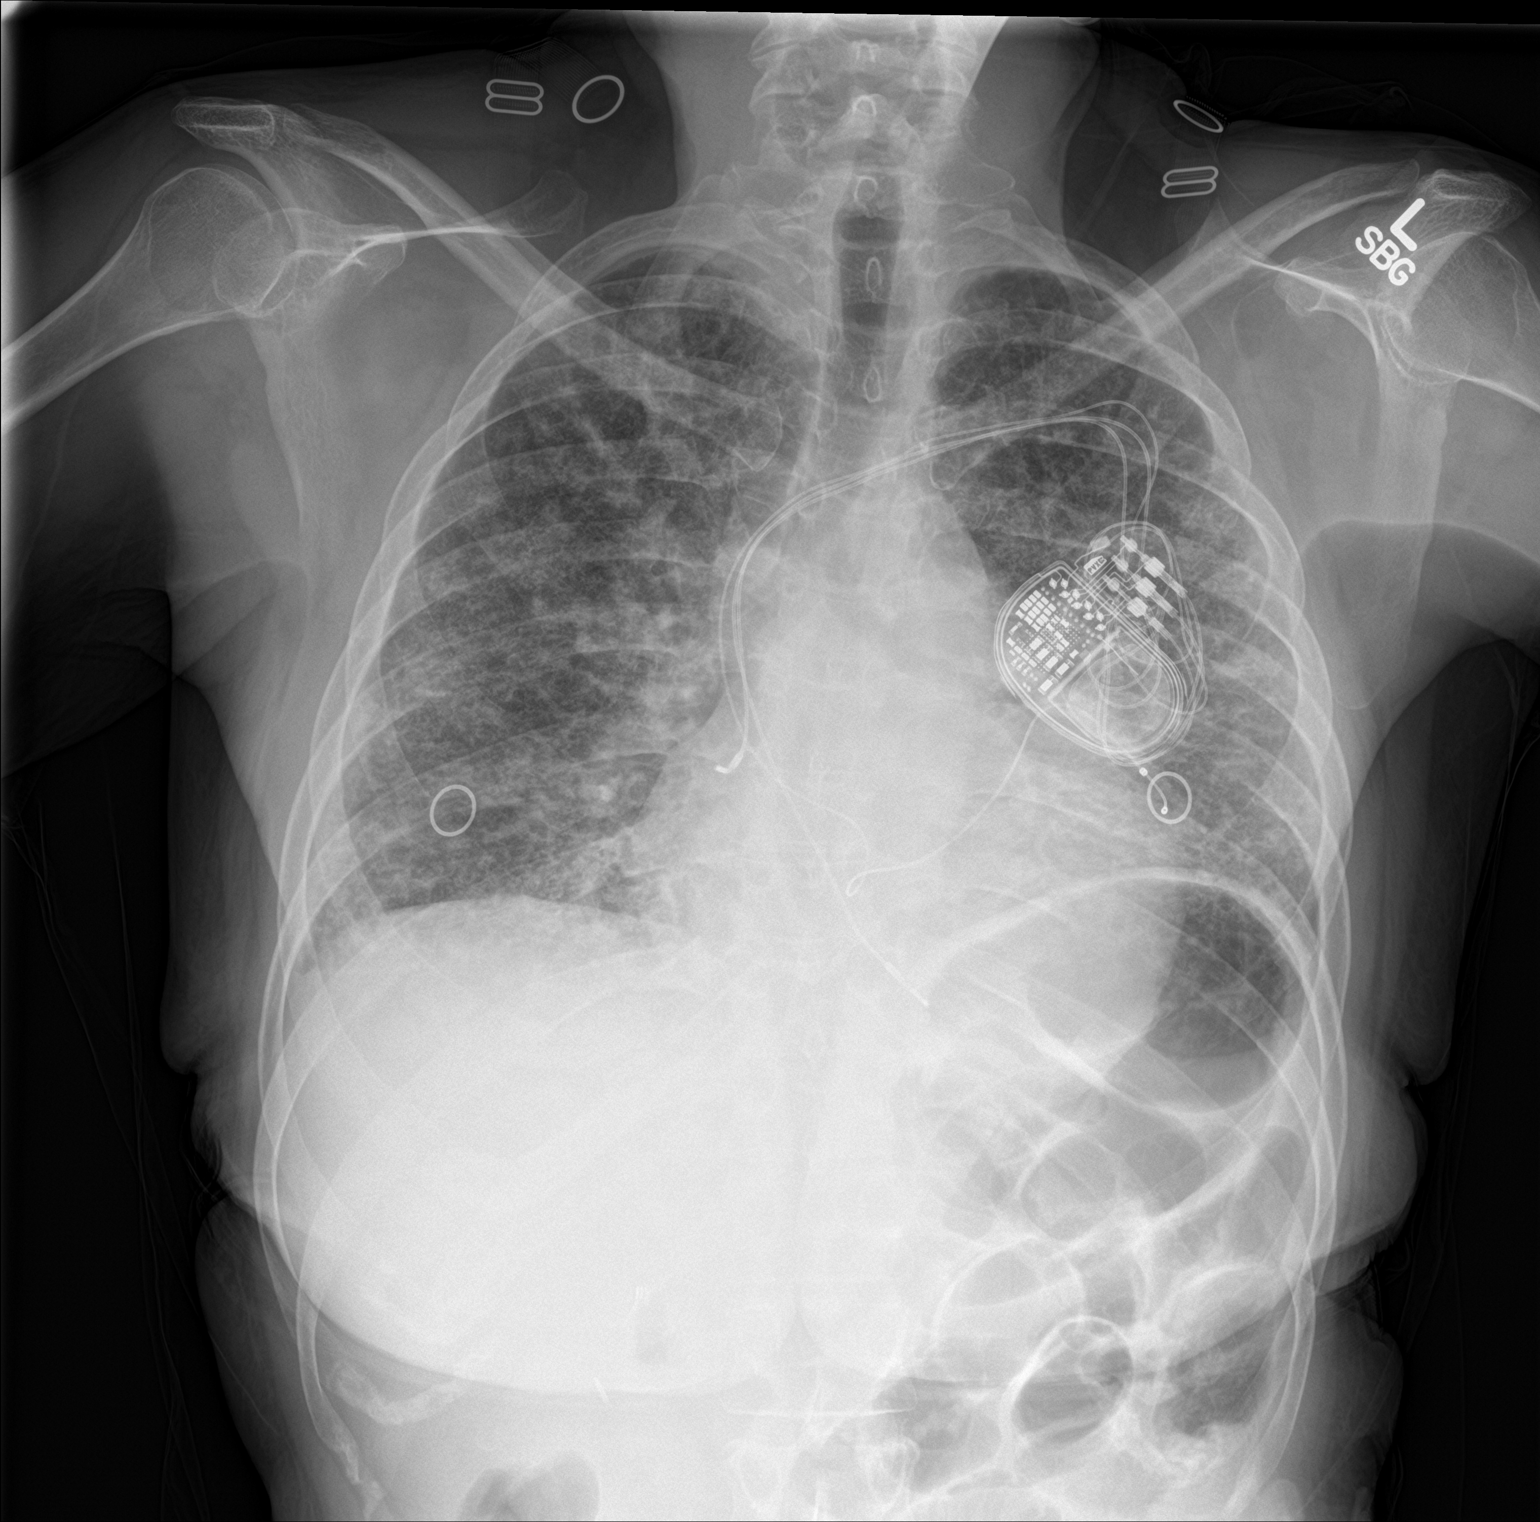

[chest lat]
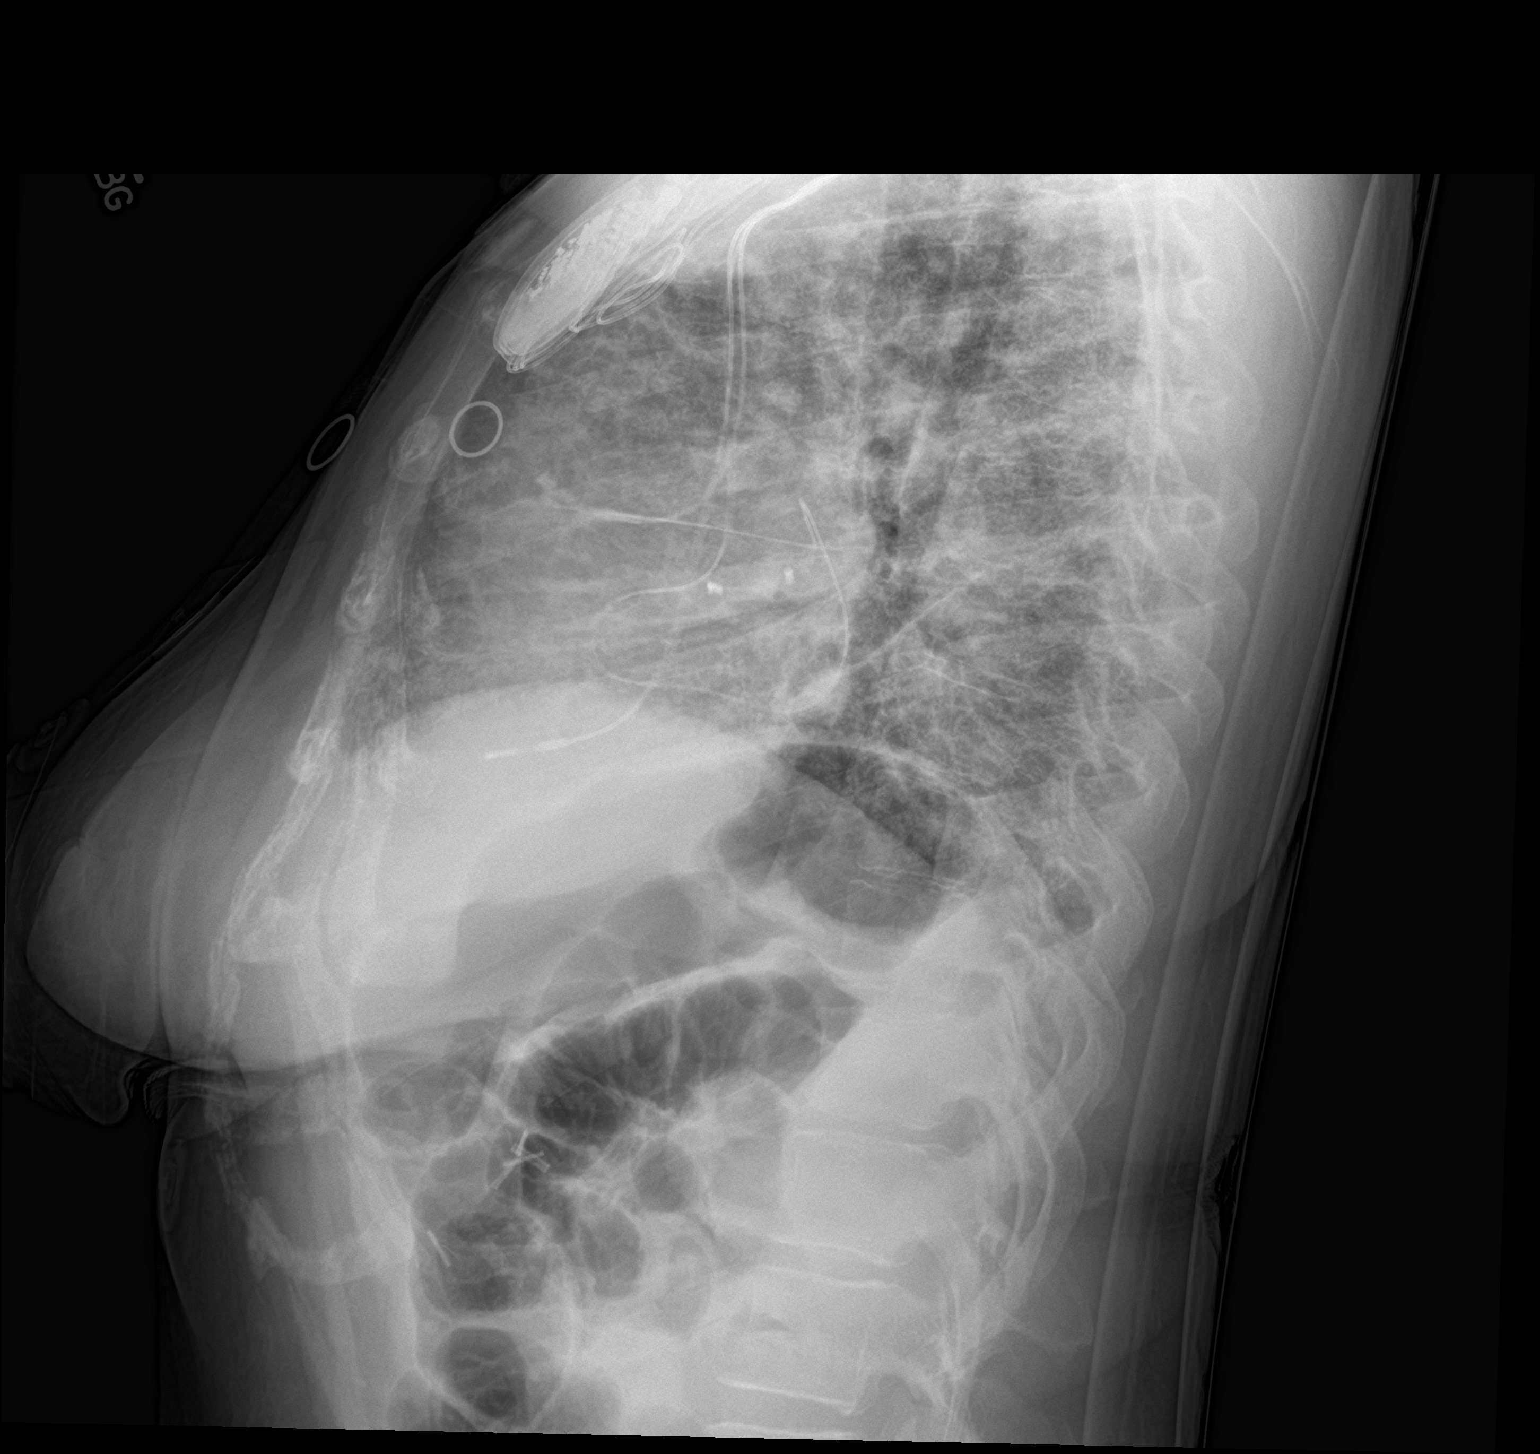

[2 of 2 positions shown; findings below may reference images not displayed]

FINDINGS: Prominence of the pulmonary station and diffuse hazy opacities
consistent with chronic interstitial lung disease are unchanged in
appearance. No pneumothorax or pleural effusion. Heart size is upper
normal. No acute or focal bony abnormality. Pacing device is noted.
IMPRESSION: Lung disease. No acute abnormality in patient with chronic
interstitial

## 2020-08-17 IMAGING — DX DG HAND 2V*R*
2 series · 2 of 2 positions shown · non-contrast
Comparison: None.

CLINICAL DATA: Pt admitted yesterday for cellulitis. Pt c/o ongoing
right and wrist and hand pain and swelling x 2 weeks with difficulty
moving it.

EXAM:
RIGHT HAND - 2 VIEW

[hand ap]
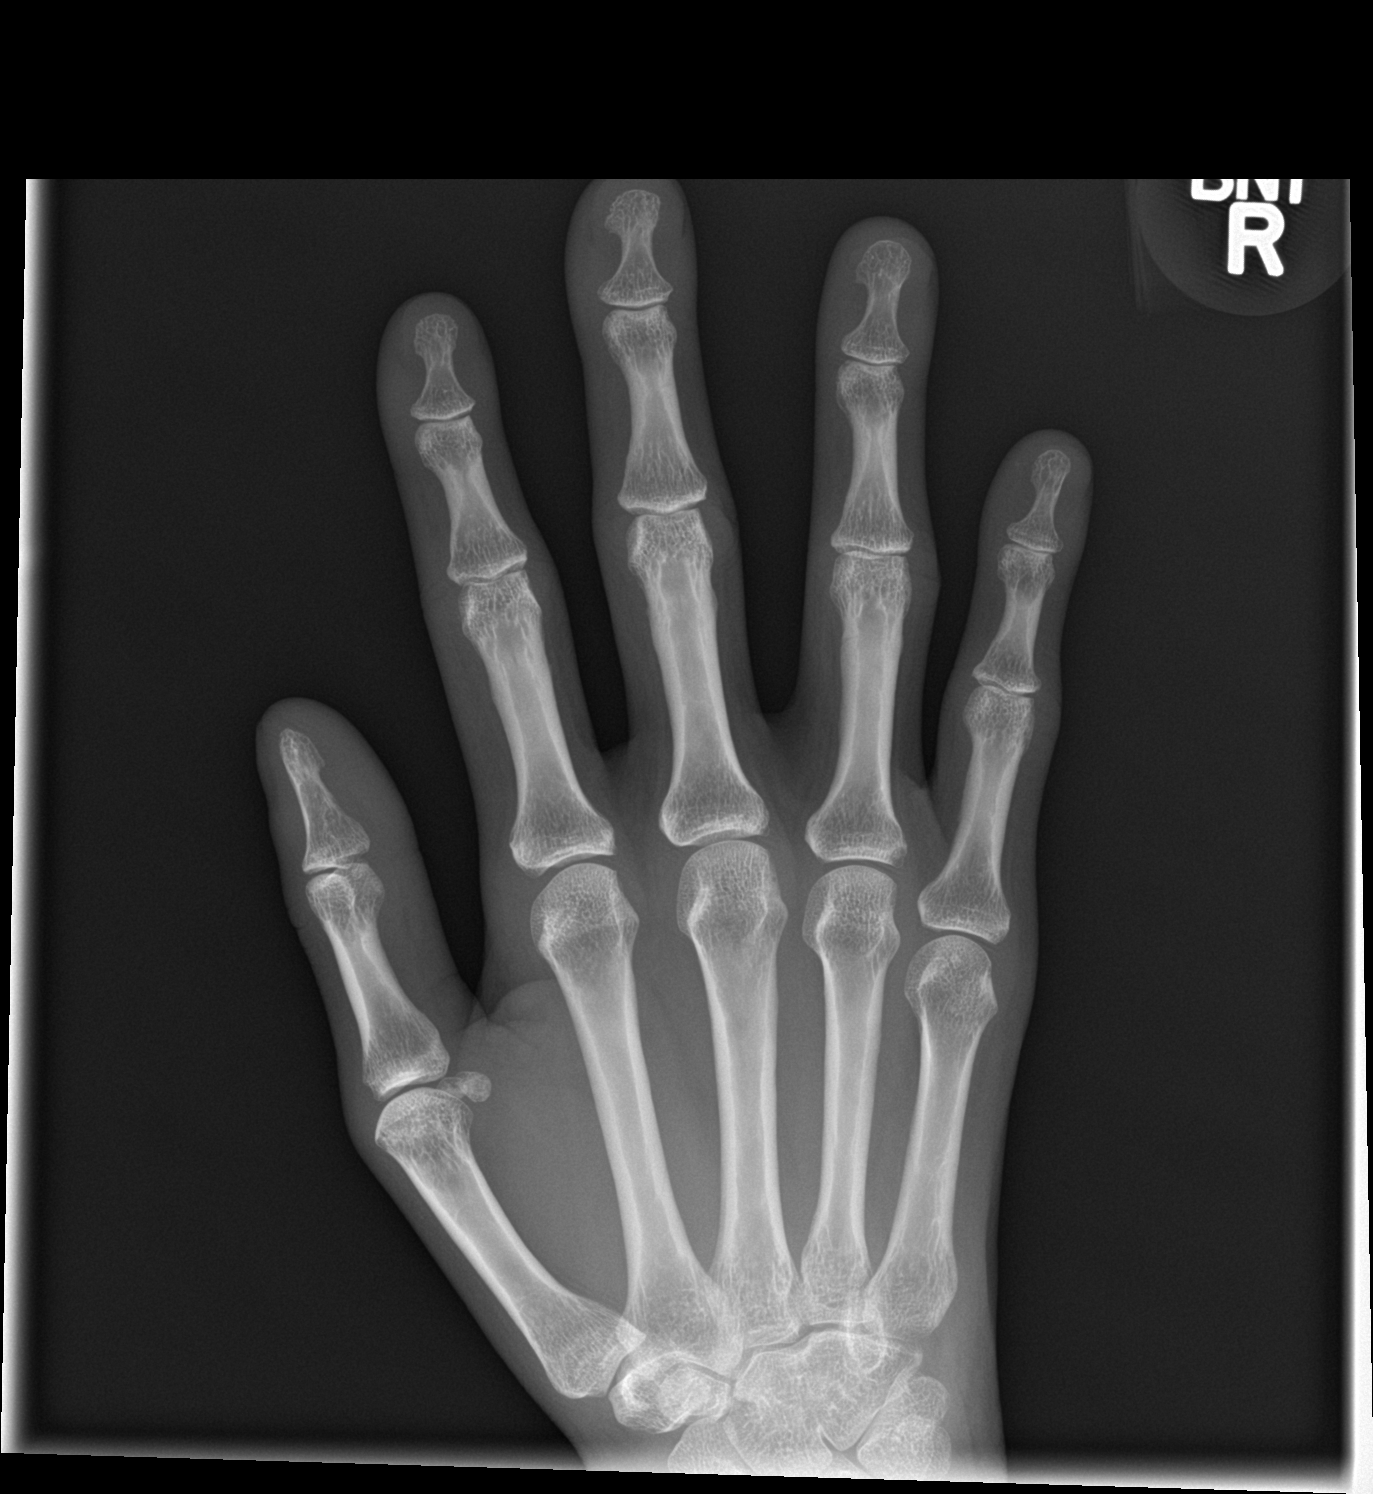

[hand lat]
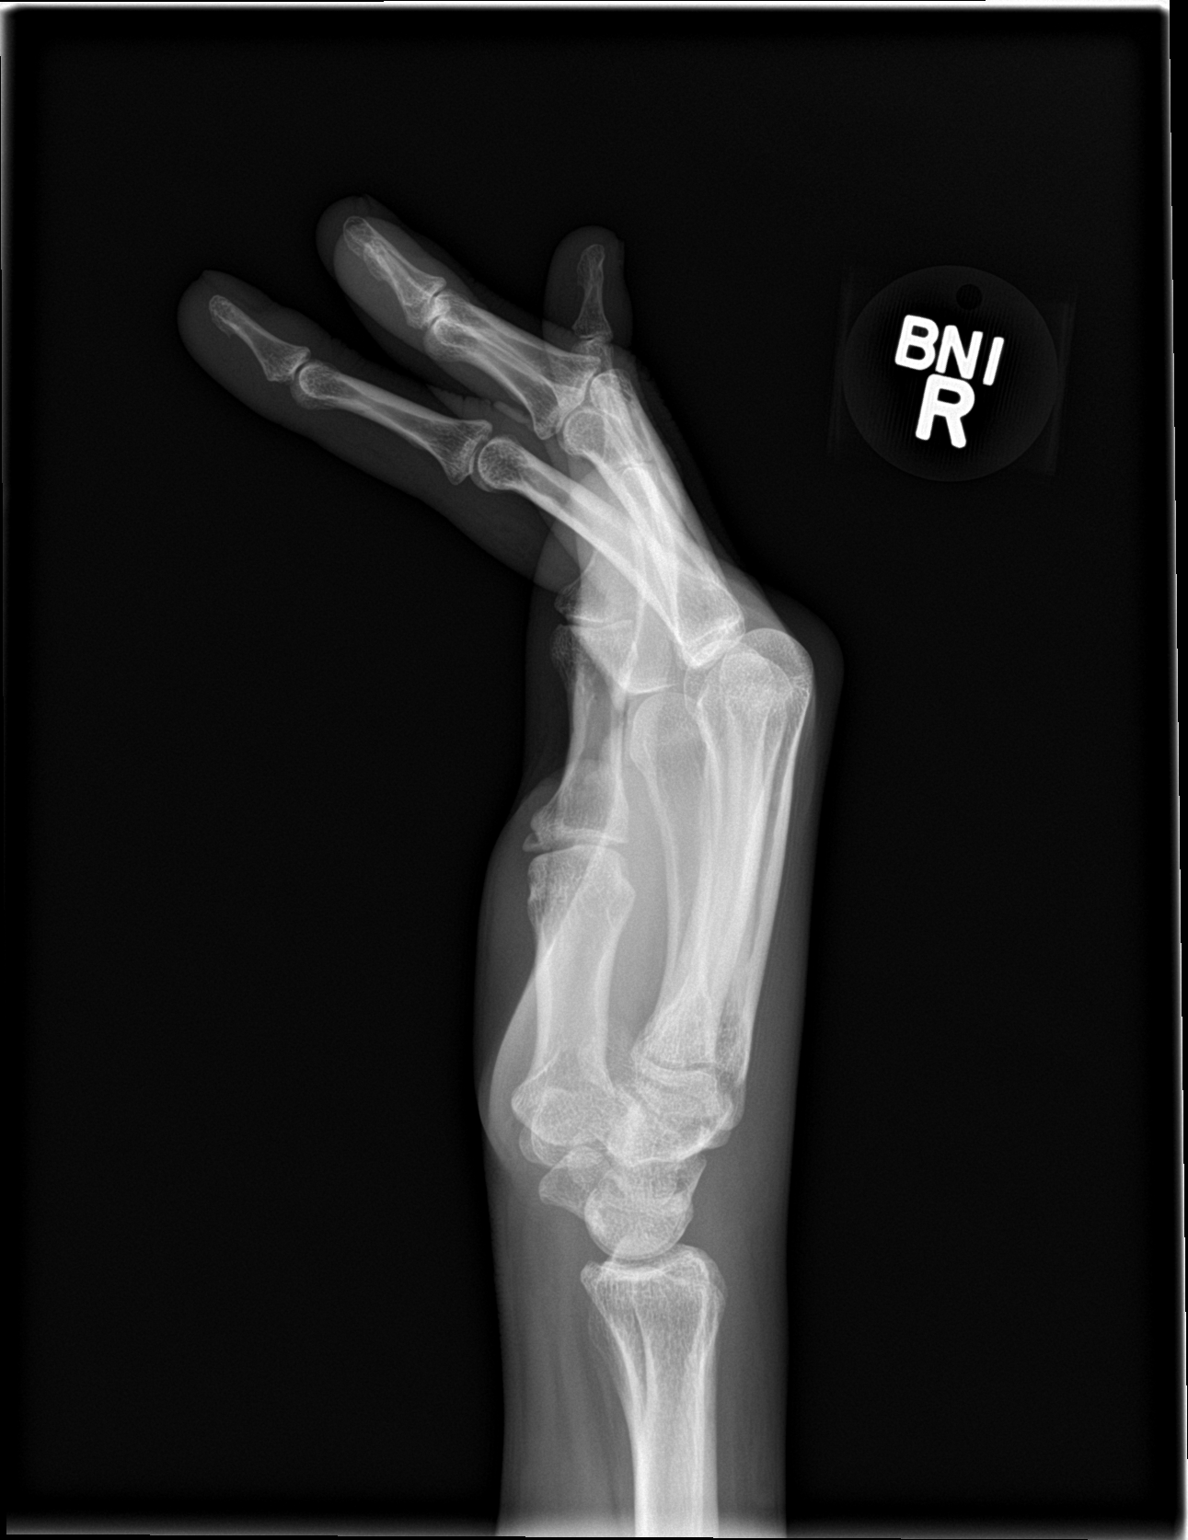

[2 of 2 positions shown; findings below may reference images not displayed]

FINDINGS: There is no evidence of fracture or dislocation. There is no
evidence of arthropathy or other focal bone abnormality. No
radiodense foreign body or subcutaneous gas. Soft tissues are
unremarkable.
IMPRESSION: Negative.

## 2020-08-17 IMAGING — DX DG WRIST 2V*R*
2 series · 2 of 2 positions shown · non-contrast
Comparison: None.

CLINICAL DATA: Pt admitted yesterday for cellulitis. Pt c/o ongoing
right and wrist and hand pain and swelling x 2 weeks with difficulty
moving it.

EXAM:
RIGHT WRIST - 2 VIEW

[wrist ap]
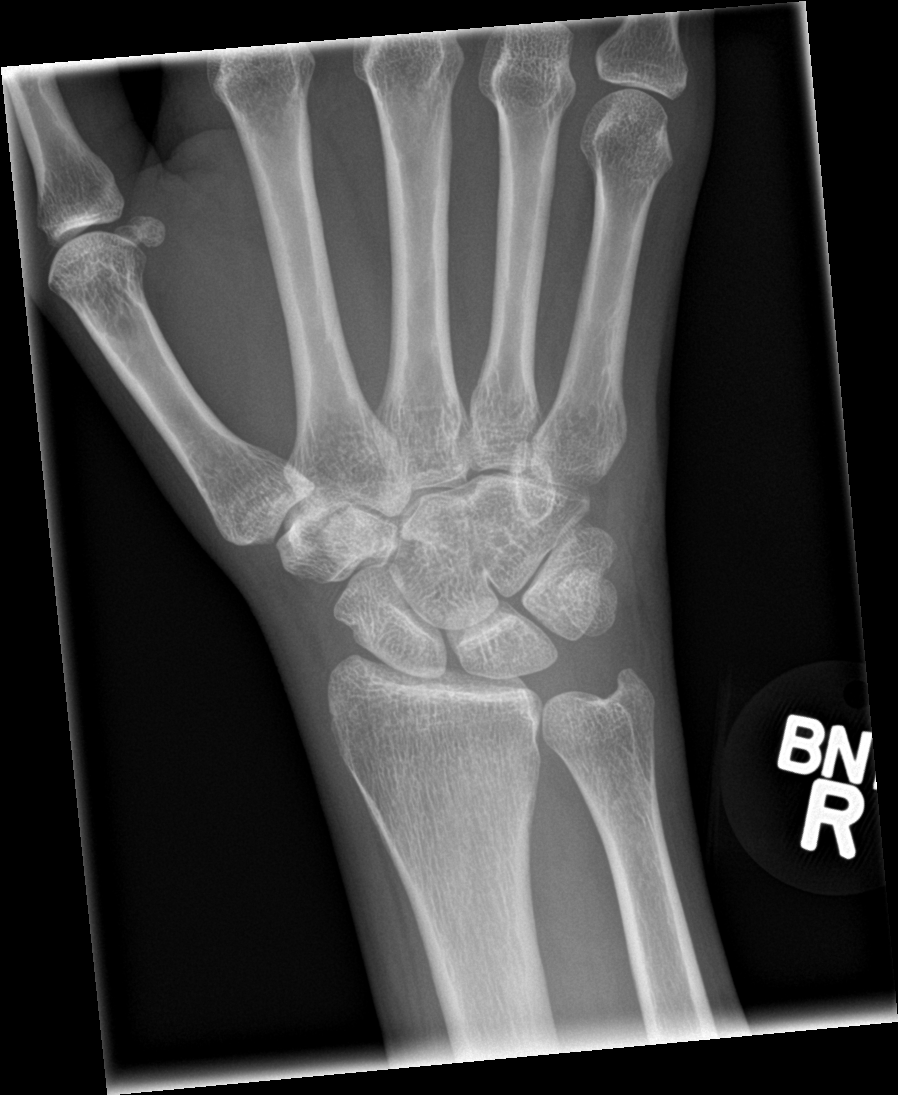

[wrist lat]
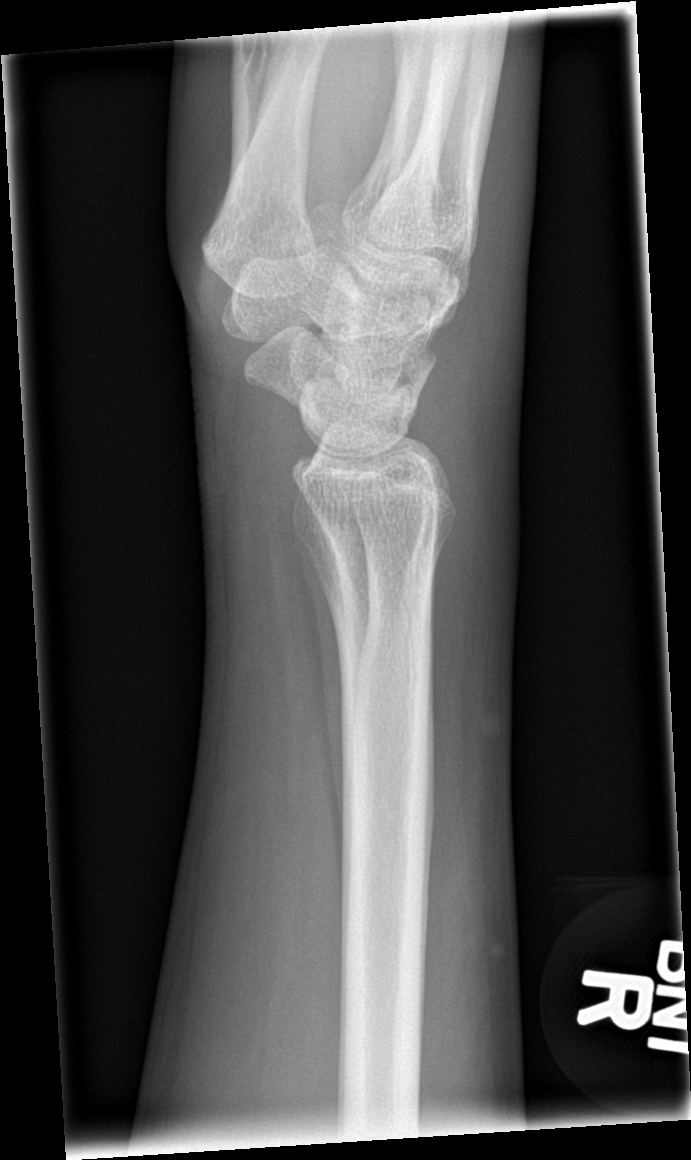

[2 of 2 positions shown; findings below may reference images not displayed]

FINDINGS: There is no evidence of fracture or dislocation. There is no
evidence of arthropathy or other focal bone abnormality. No
radiodense foreign body or subcutaneous gas. Soft tissues are
unremarkable.
IMPRESSION: Negative.

## 2020-10-14 IMAGING — CT CT CHEST HIGH RESOLUTION W/O CM
2 of 5 series · 15 of 36 positions shown, 18 images · non-contrast
Comparison: 04/08/2018, 12/01/2017, 01/05/2017 and 02/22/2016.

CLINICAL DATA: Two-month history of increased oxygen use.

EXAM:
CT CHEST WITHOUT CONTRAST
TECHNIQUE: Multidetector CT imaging of the chest was performed following the
standard protocol without intravenous contrast. High resolution
imaging of the lungs, as well as inspiratory and expiratory imaging,
was performed.

[Series 2: high resolution · axial · 0.59mm/px · z∈[-205,+11]mm · 12 of 120 slices shown, 15 images]
[im 6/120  mediastinal]
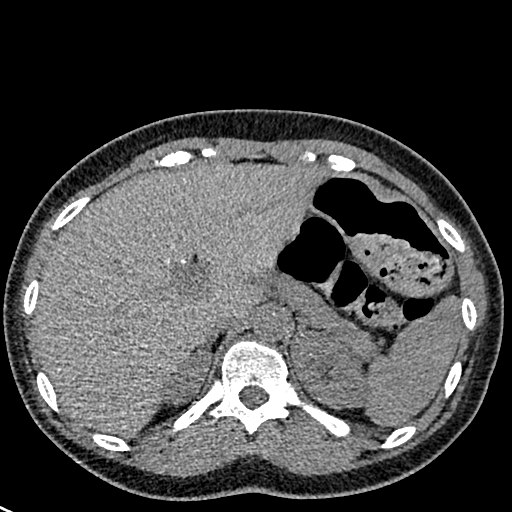
[im 6/120  lung]
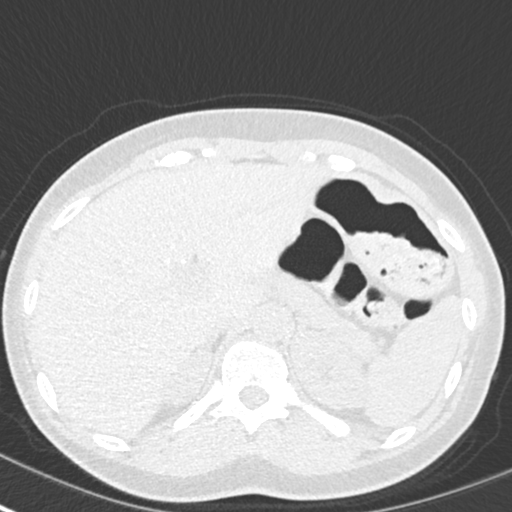
[im 18/120  lung]
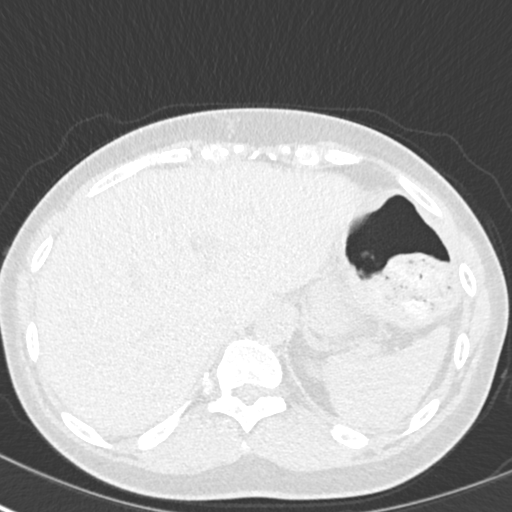
[im 29/120  lung]
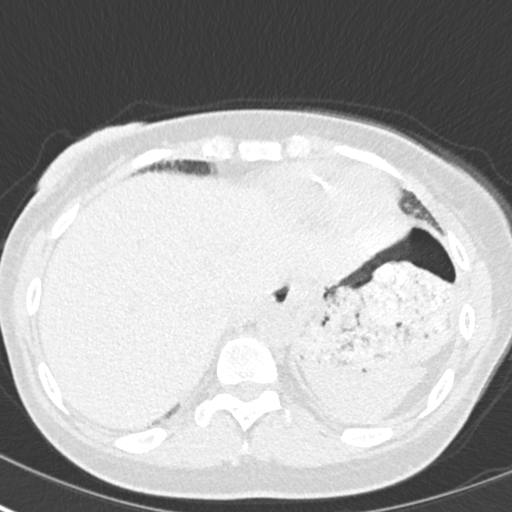
[im 35/120  lung]
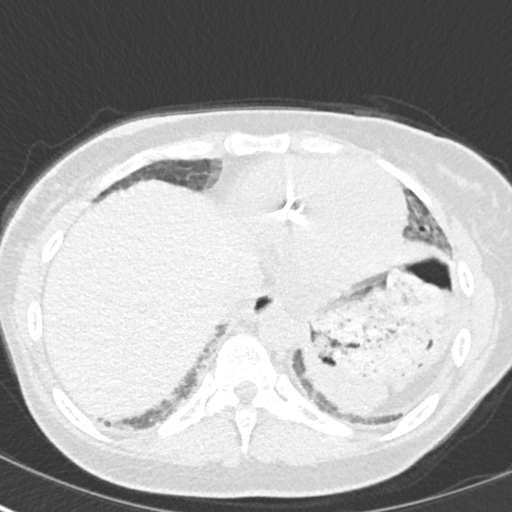
[im 46/120  mediastinal]
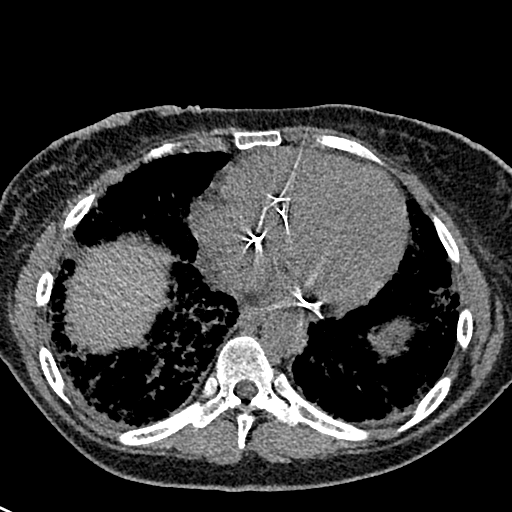
[im 46/120  lung]
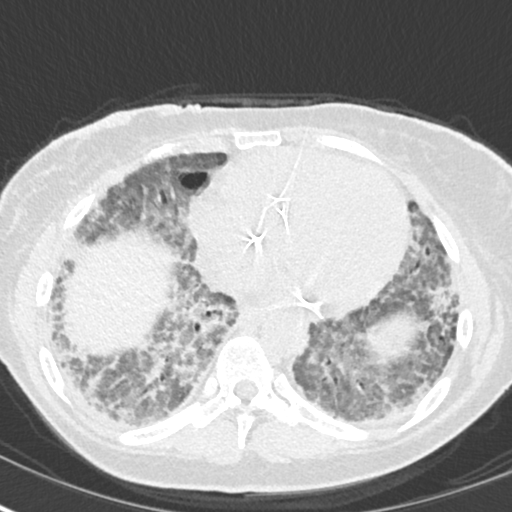
[im 57/120  lung]
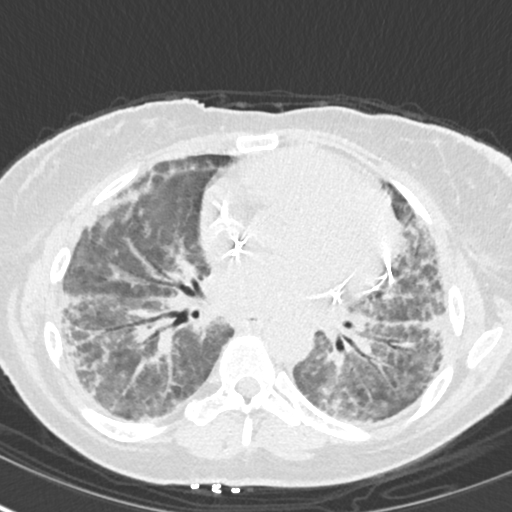
[im 63/120  lung]
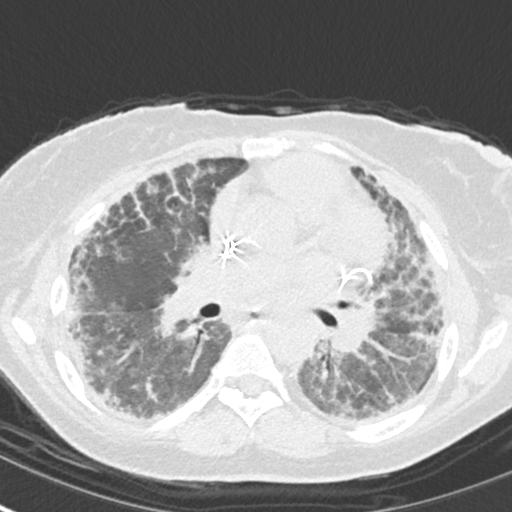
[im 74/120  lung]
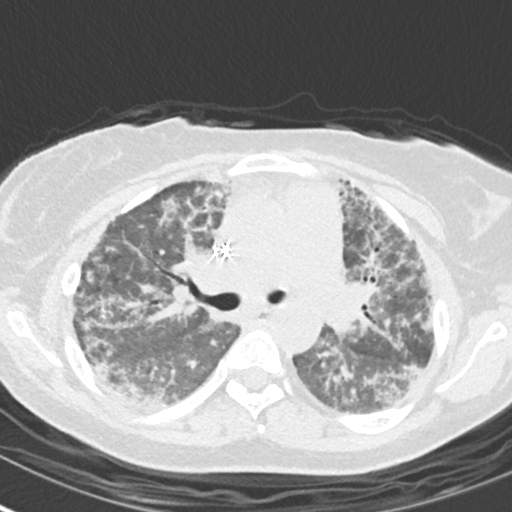
[im 86/120  mediastinal]
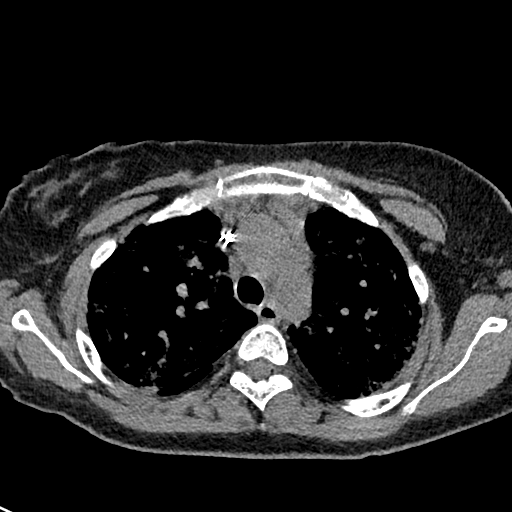
[im 86/120  lung]
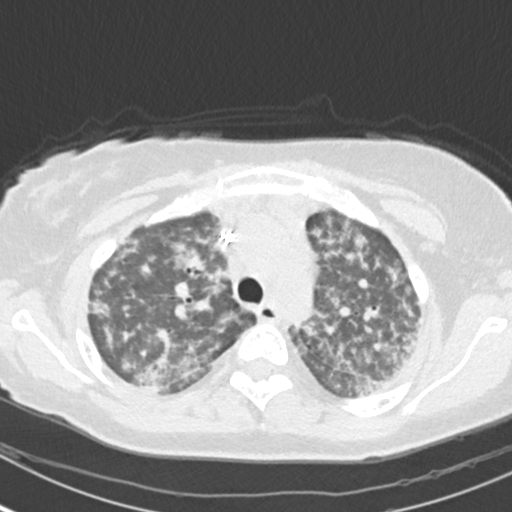
[im 91/120  lung]
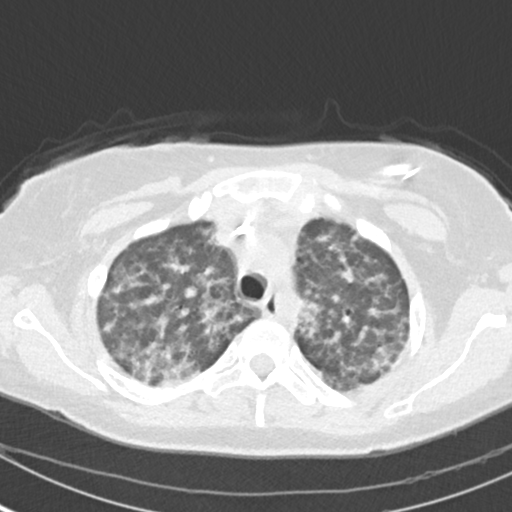
[im 103/120  lung]
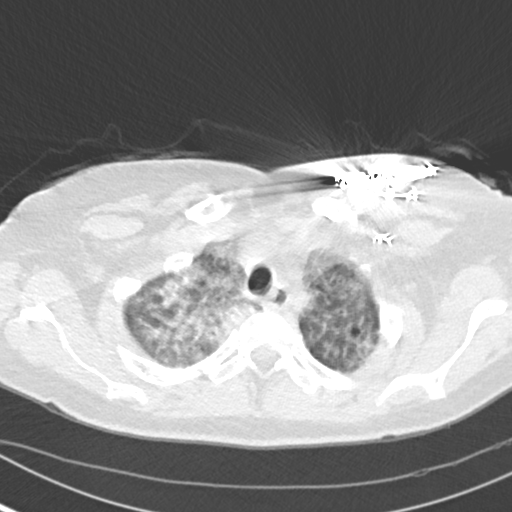
[im 114/120  lung]
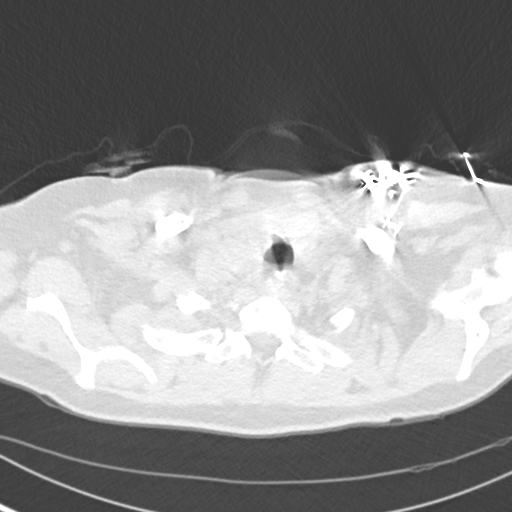

[Series 10: coronal · coronal · 0.48mm/px · 3 of 104 slices shown]
[im 21/104  lung]
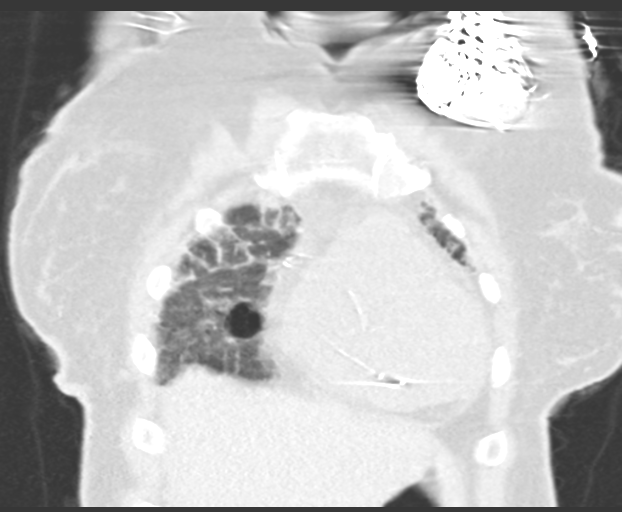
[im 42/104  lung]
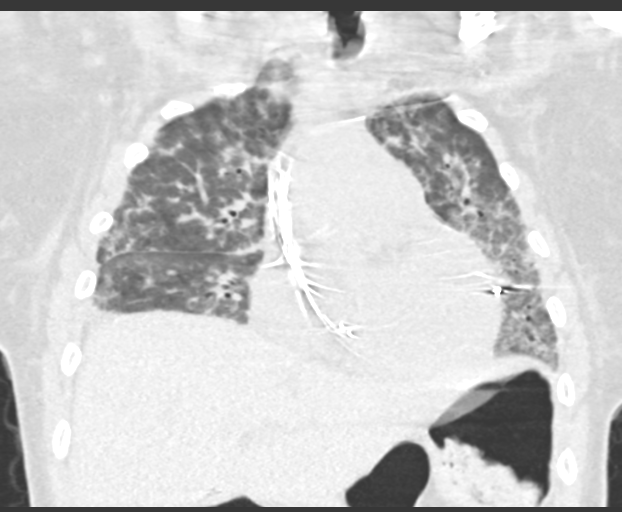
[im 62/104  lung]
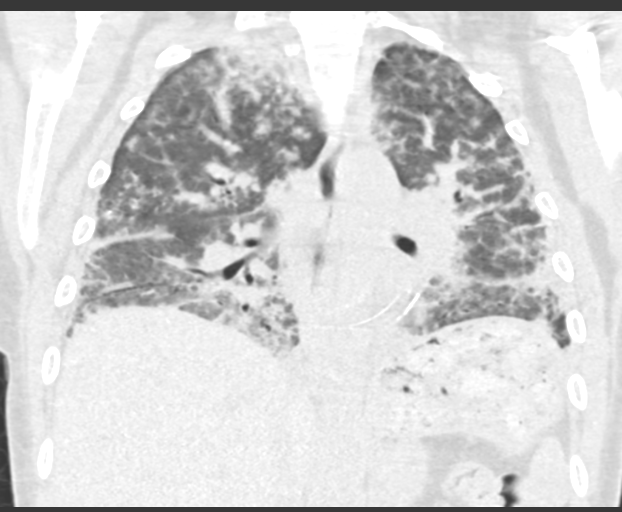

[15 of 36 positions shown; findings below may reference images not displayed]

FINDINGS: Cardiovascular: Pulmonary arteries and heart are enlarged. No
pericardial effusion.

Mediastinum/Nodes: Mediastinal lymph nodes measure up to 12 mm in
the prevascular space, similar. Subcarinal lymph node is difficult
to measure without IV contrast. Hilar regions are difficult to
assess without IV contrast but adenopathy is suspected. No axillary
adenopathy. Esophagus is grossly unremarkable.

Lungs/Pleura: Image quality is degraded by motion. Diffuse
interstitial thickening and nodularity with peripheral septal
thickening and traction bronchiectasis. There is thickening and
nodularity involving the fissures. Findings are grossly similar to
prior exams. No air trapping. No pleural fluid. Mild narrowing of
the left mainstem bronchus, as before. Airway is otherwise
unremarkable.

Upper Abdomen: Visualized portions of the liver, adrenal glands,
kidneys, spleen, pancreas, stomach and bowel are grossly
unremarkable. Cholecystectomy.

Musculoskeletal: Negative.
IMPRESSION: 1. Pulmonary parenchymal pattern of nodularity appears to be
perilymphatic in distribution and is suspicious for sarcoid.
Findings are similar to prior exams. Mediastinal and suspected hilar
adenopathy with narrowing of the left mainstem bronchus, as before.
2. Enlarged pulmonary arteries, indicative of pulmonary arterial
hypertension.

## 2020-11-16 IMAGING — CR DG CHEST 2V
2 series · 2 of 2 positions shown · non-contrast
Comparison: Chest radiograph 07/24/2018, and high-resolution CT of
the chest performed 09/21/2018

CLINICAL DATA: Acute onset of shortness of breath. Fever, chills
and body aches.

EXAM:
CHEST - 2 VIEW

[chest lat]
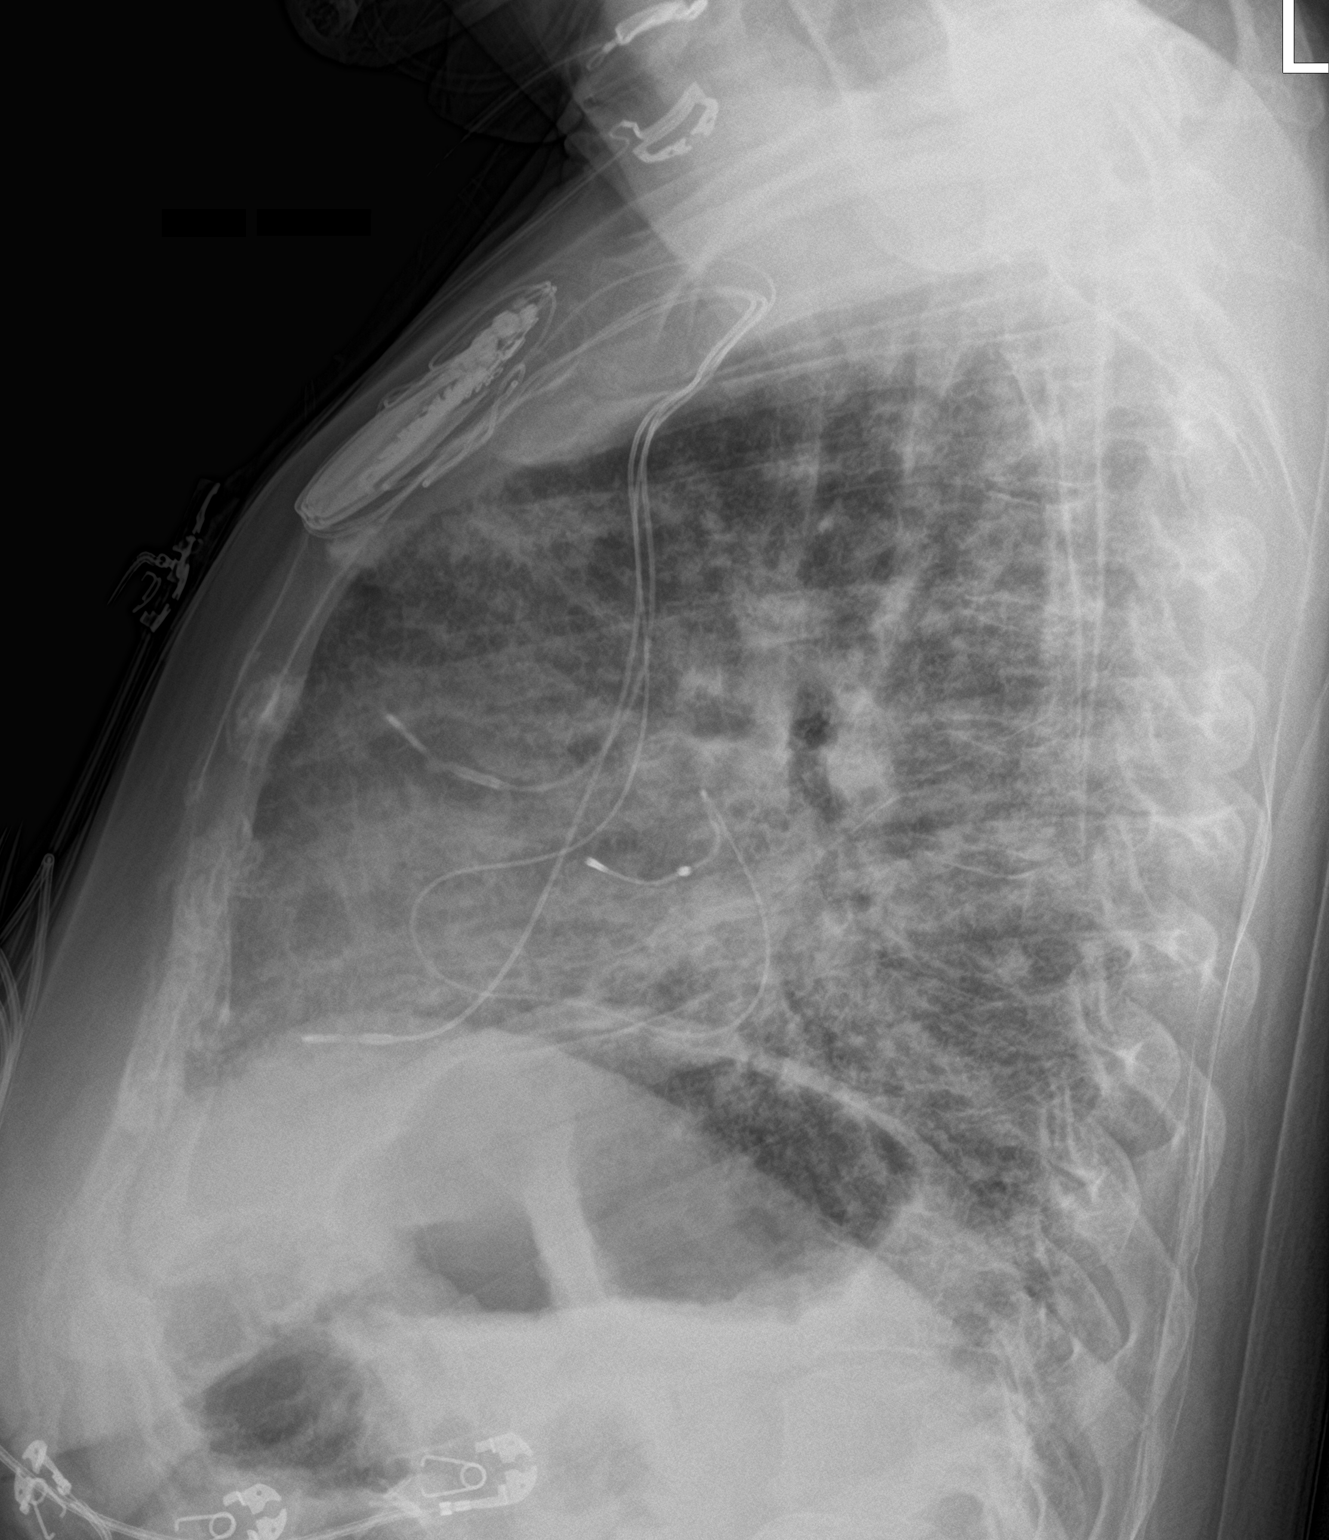

[chest ap]
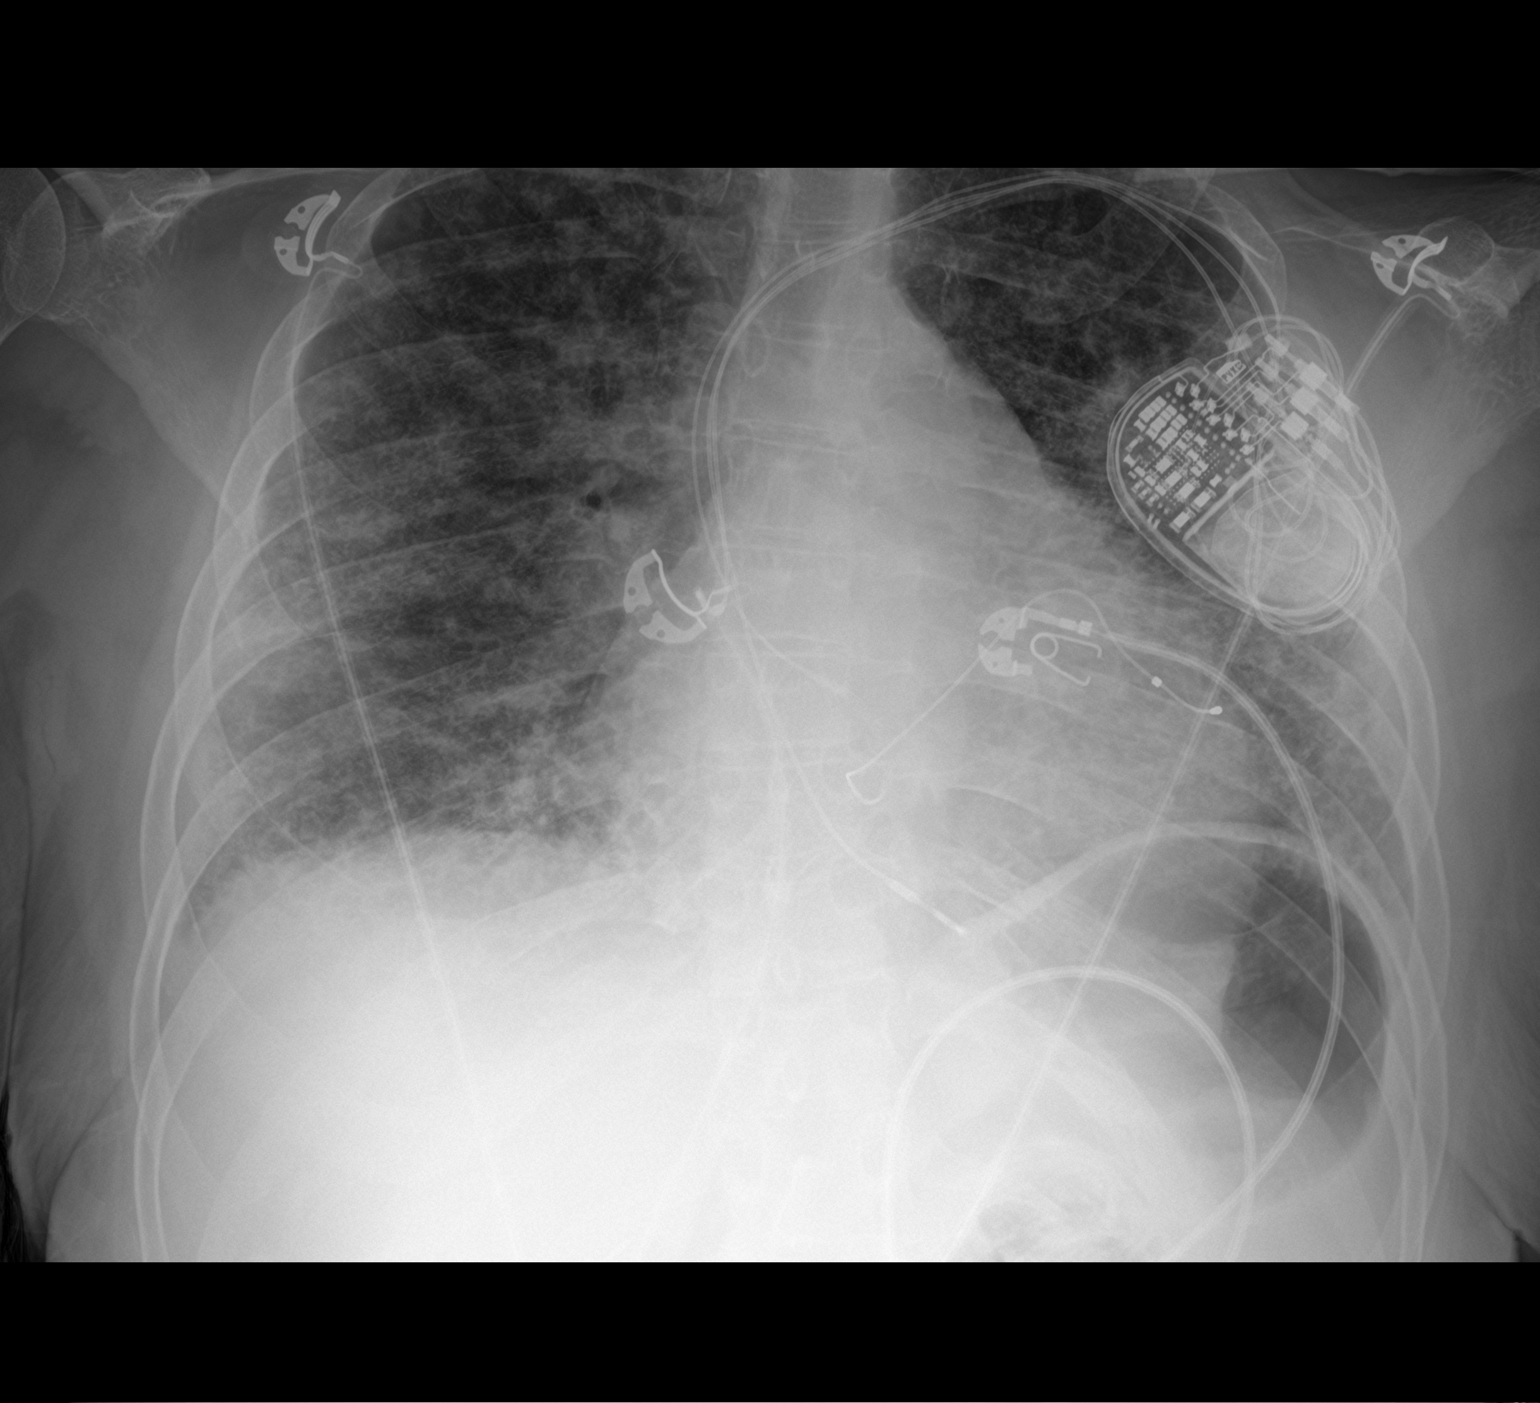

[2 of 2 positions shown; findings below may reference images not displayed]

FINDINGS: The lungs are well-aerated. Diffuse interstitial thickening is again
noted, with associated nodularity, thought to reflect sarcoidosis on
the prior CT. There may be mild superimposed right peripheral mid
lung and left midlung airspace opacity, which could reflect
pneumonia. There is no evidence of pleural effusion or pneumothorax.

The heart is borderline normal in size. A pacemaker is noted at the
left chest wall, with leads ending at the right atrium, right
ventricle and coronary sinus. No acute osseous abnormalities are
seen.
IMPRESSION: 1. Diffuse interstitial thickening again noted, with associated
nodularity, thought to reflect sarcoidosis on the prior CT.
2. Suspect mild superimposed right peripheral mid lung and left
midlung airspace opacity, which could reflect pneumonia.

## 2020-11-16 IMAGING — CR DG WRIST COMPLETE 3+V*R*
4 series · 4 of 4 positions shown · non-contrast
Comparison: Right wrist radiographs performed 07/25/2018

CLINICAL DATA: Acute onset of right wrist pain.  Initial encounter.

EXAM:
RIGHT WRIST - COMPLETE 3+ VIEW

[wrist pa]
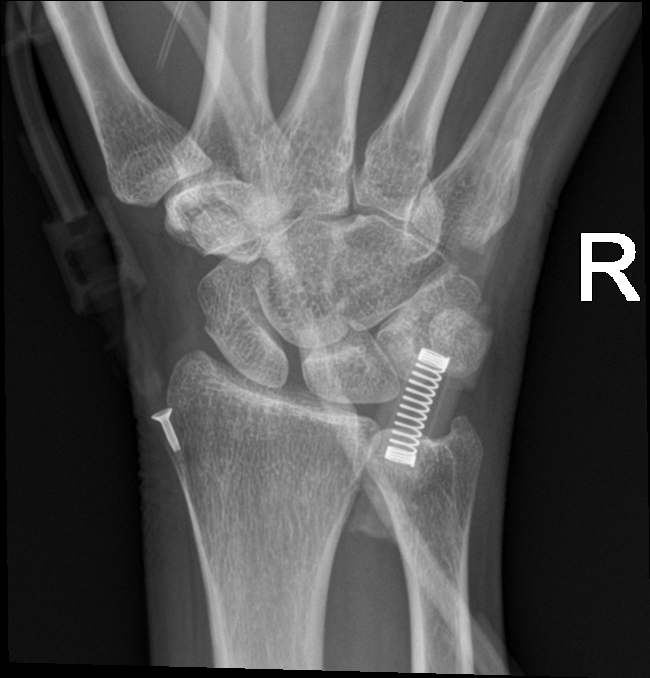

[wrist obl]
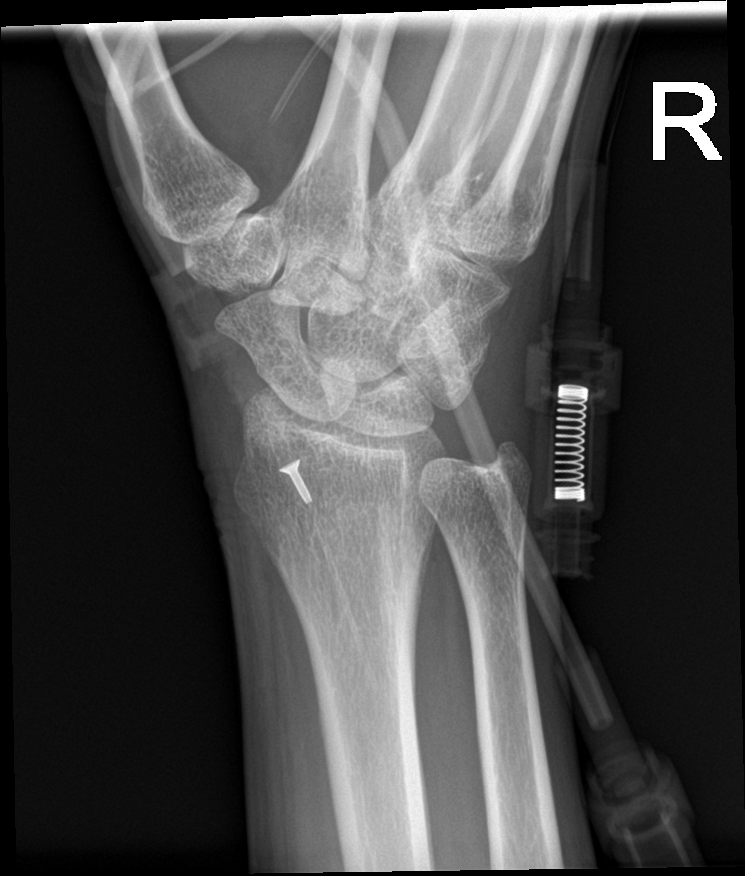

[wrist lat]
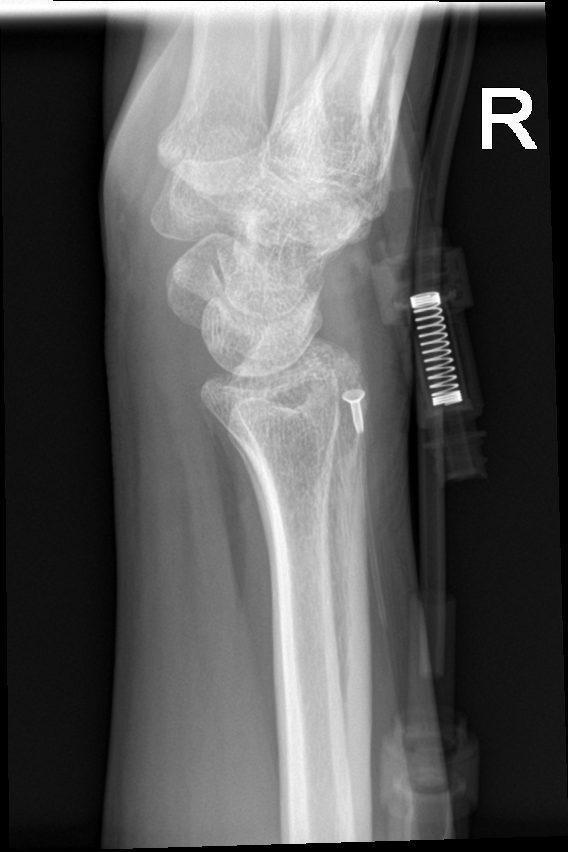

[wrist navicular]
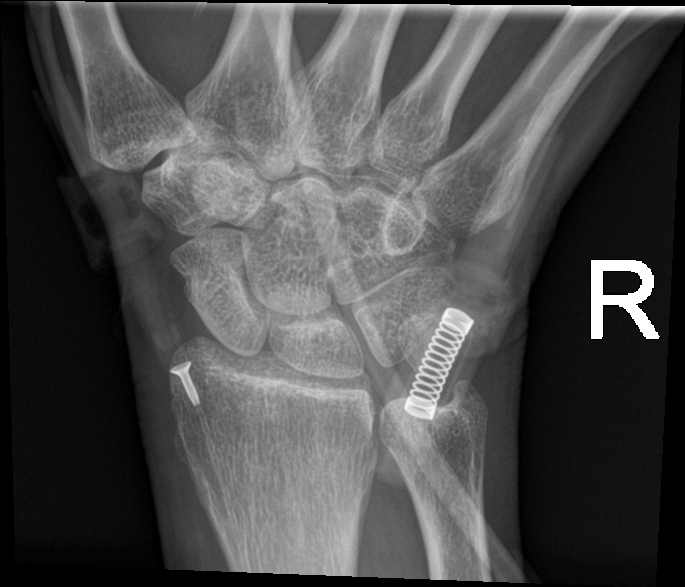

[4 of 4 positions shown; findings below may reference images not displayed]

FINDINGS: There is no evidence of fracture or dislocation. The carpal rows are
intact, and demonstrate normal alignment. The joint spaces are
preserved.

No significant soft tissue abnormalities are seen. A peripheral IV
catheter is noted overlying the radial aspect of the wrist.
IMPRESSION: No evidence of fracture or dislocation.

## 2021-01-19 IMAGING — CT CT RENAL STONE PROTOCOL
2 of 8 series · 14 of 46 positions shown, 18 images · non-contrast
Comparison: 01/04/2017

CLINICAL DATA: Abdominal and back pain over the last several days.
Assess for renal stone disease.

EXAM:
CT ABDOMEN AND PELVIS WITHOUT CONTRAST
TECHNIQUE: Multidetector CT imaging of the abdomen and pelvis was performed
following the standard protocol without IV contrast.

[Series 11: thins · axial · 0.64mm/px · z∈[-177,+49]mm · 11 of 262 slices shown, 15 images]
[im 24/262  soft-tissue]
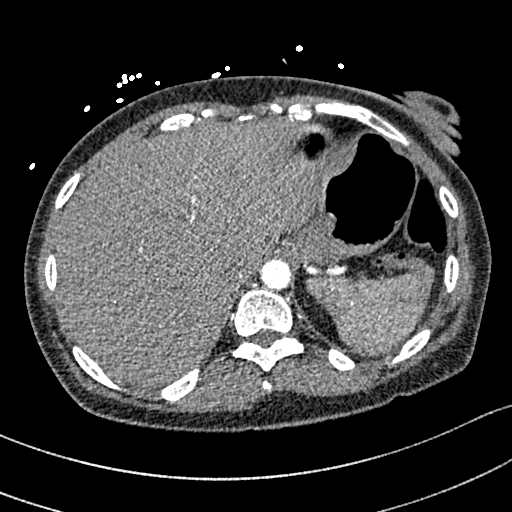
[im 24/262  bone]
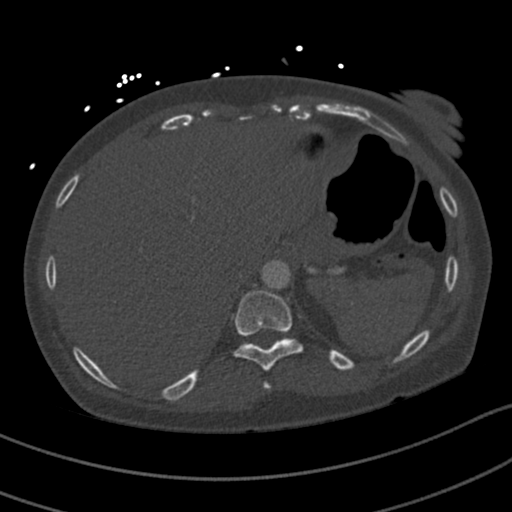
[im 48/262  soft-tissue]
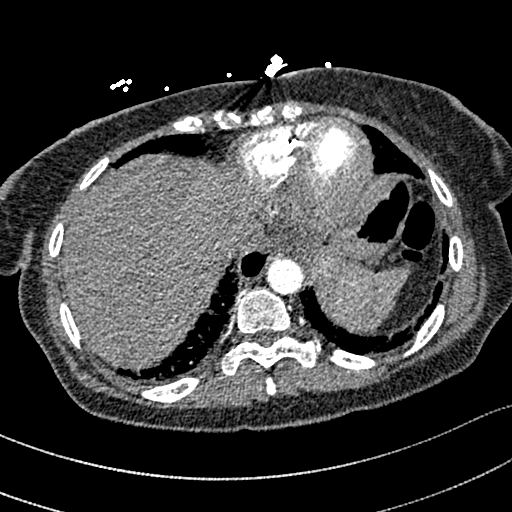
[im 72/262  soft-tissue]
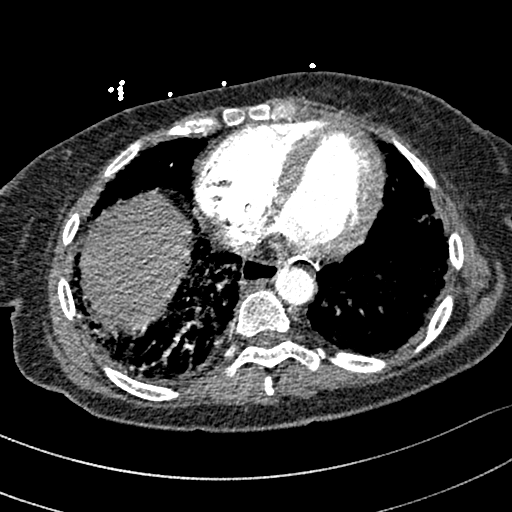
[im 107/262  soft-tissue]
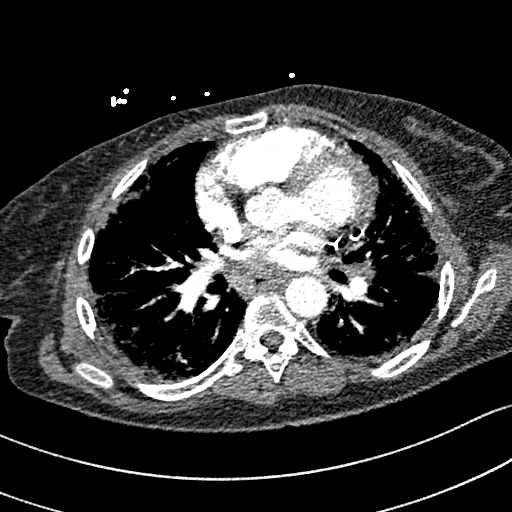
[im 131/262  soft-tissue]
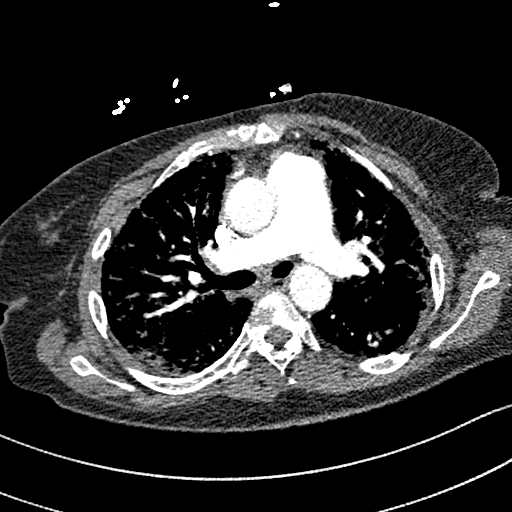
[im 155/262  soft-tissue]
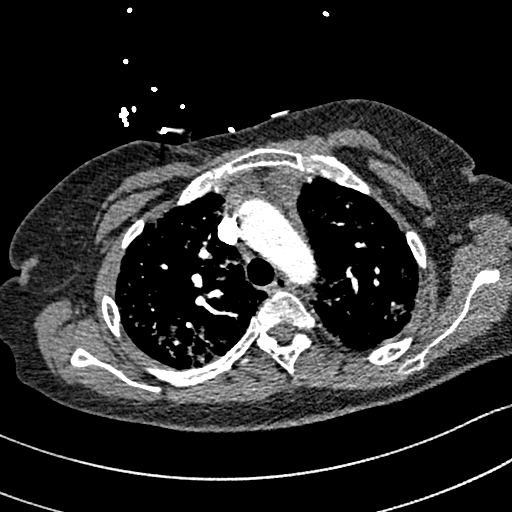
[im 190/262  soft-tissue]
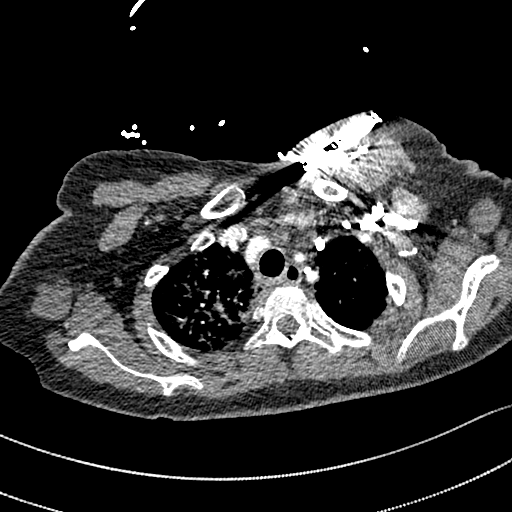
[im 214/262  soft-tissue]
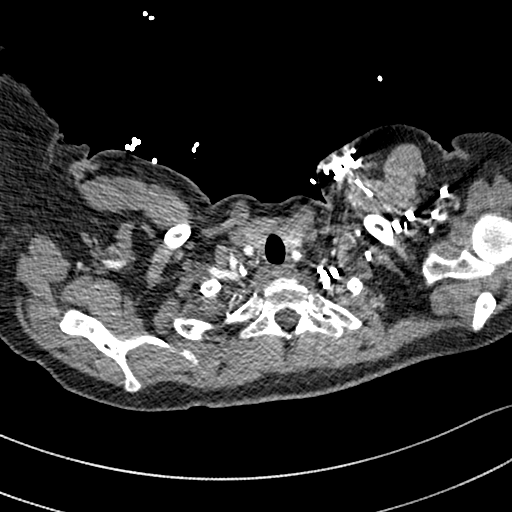
[im 214/262  lung]
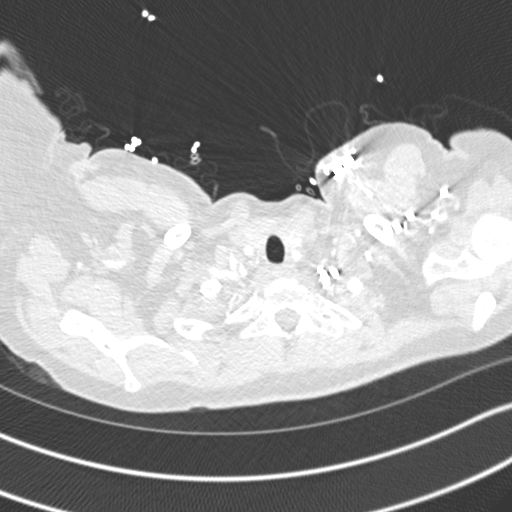
[im 226/262  lung]
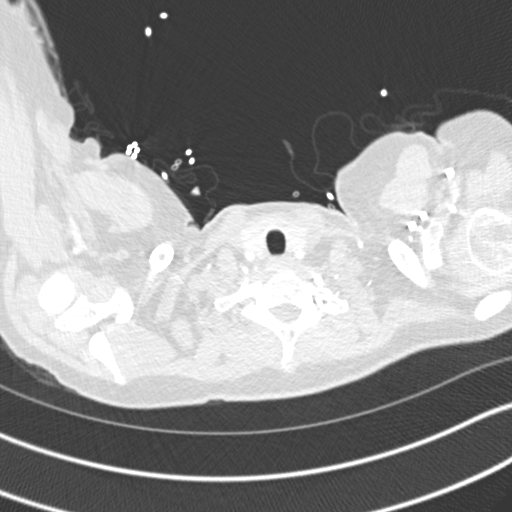
[im 238/262  soft-tissue]
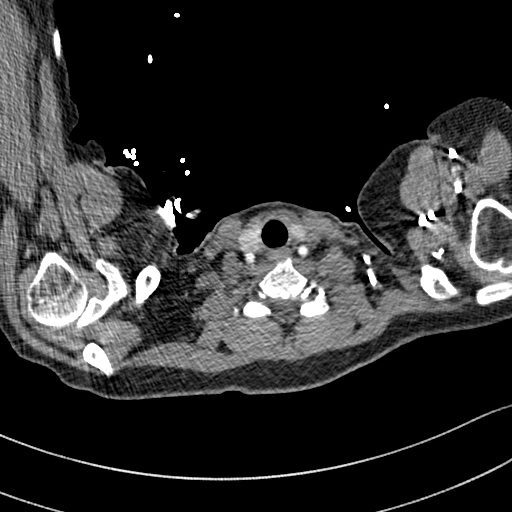
[im 238/262  lung]
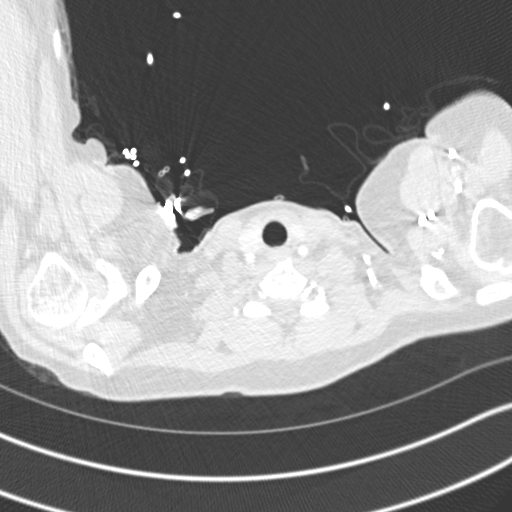
[im 238/262  bone]
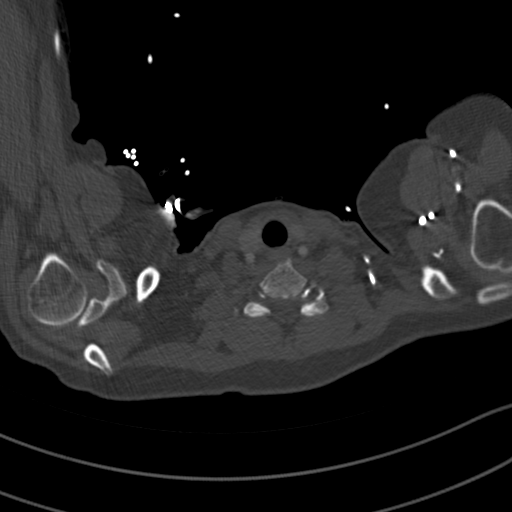
[im 250/262  lung]
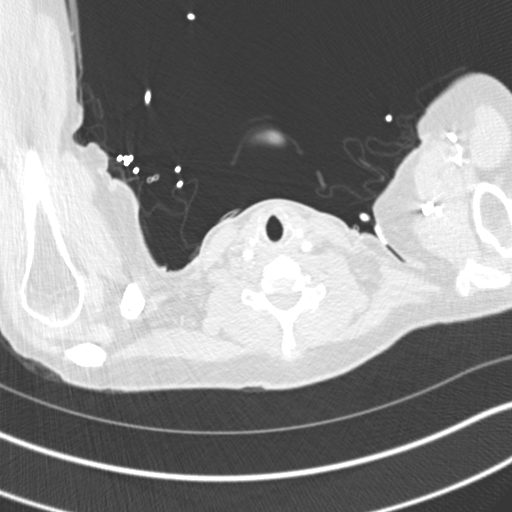

[Series 13: coronal mpr · coronal · 0.47mm/px · 3 of 151 slices shown]
[im 38/151  soft-tissue]
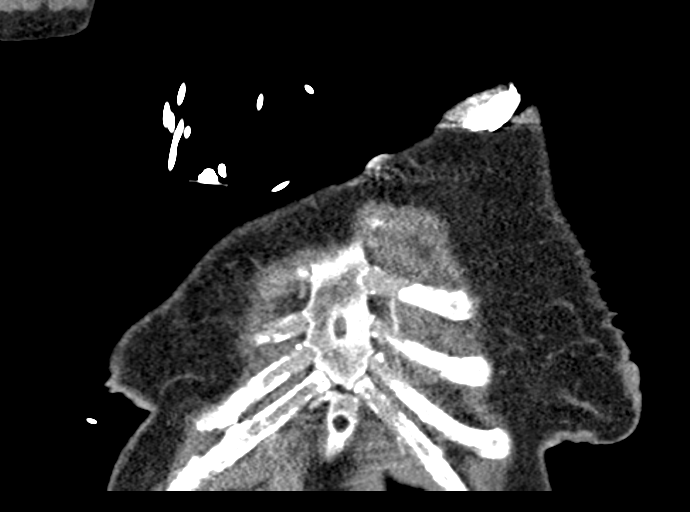
[im 76/151  soft-tissue]
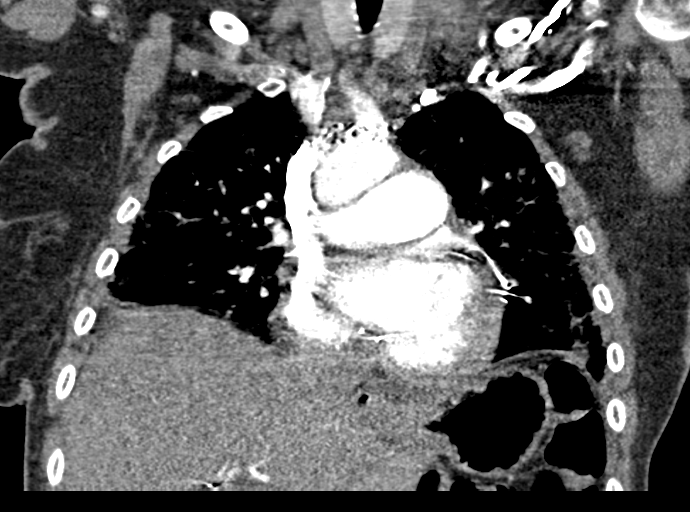
[im 113/151  soft-tissue]
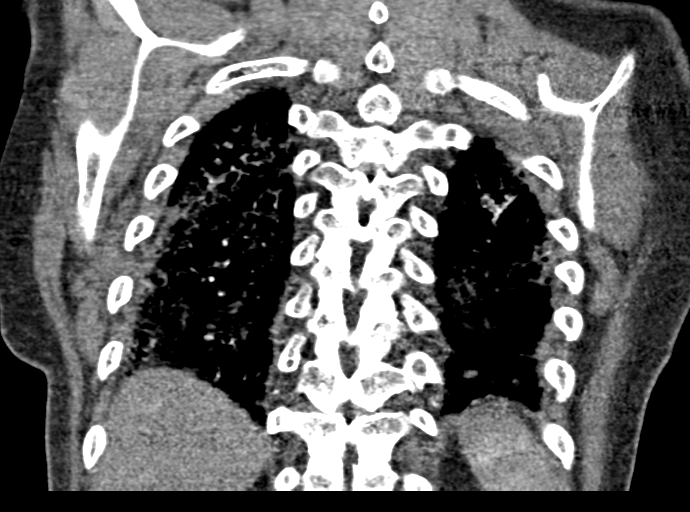

[14 of 46 positions shown; findings below may reference images not displayed]

FINDINGS: Lower chest: See results of chest CT. Chronic interstitial lung
disease. Cardiomegaly and pacemaker.

Hepatobiliary: No liver parenchymal abnormality seen. Previous
cholecystectomy.

Pancreas: Normal

Spleen: Normal

Adrenals/Urinary Tract: Adrenal glands are normal. Kidneys are
normal. No cyst, mass, stone or hydronephrosis. No stone in the
bladder.

Stomach/Bowel: No abnormal bowel finding by CT.  Normal appendix.

Vascular/Lymphatic: Mild aortoiliac atherosclerosis. No aneurysm.
IVC is normal. No retroperitoneal adenopathy.

Reproductive: Normal

Other: No free fluid or air.

Musculoskeletal: No acute lumbar or pelvic finding.
IMPRESSION: Negative abdominal study. No evidence of urinary tract stone disease
or other cause of lumbar region back pain.

## 2021-01-19 IMAGING — DX PORTABLE CHEST - 1 VIEW
1 series · 1 of 1 positions shown · non-contrast
Comparison: October 24, 2018

CLINICAL DATA: Cough and shortness of breath.

EXAM:
PORTABLE CHEST 1 VIEW

[chest]
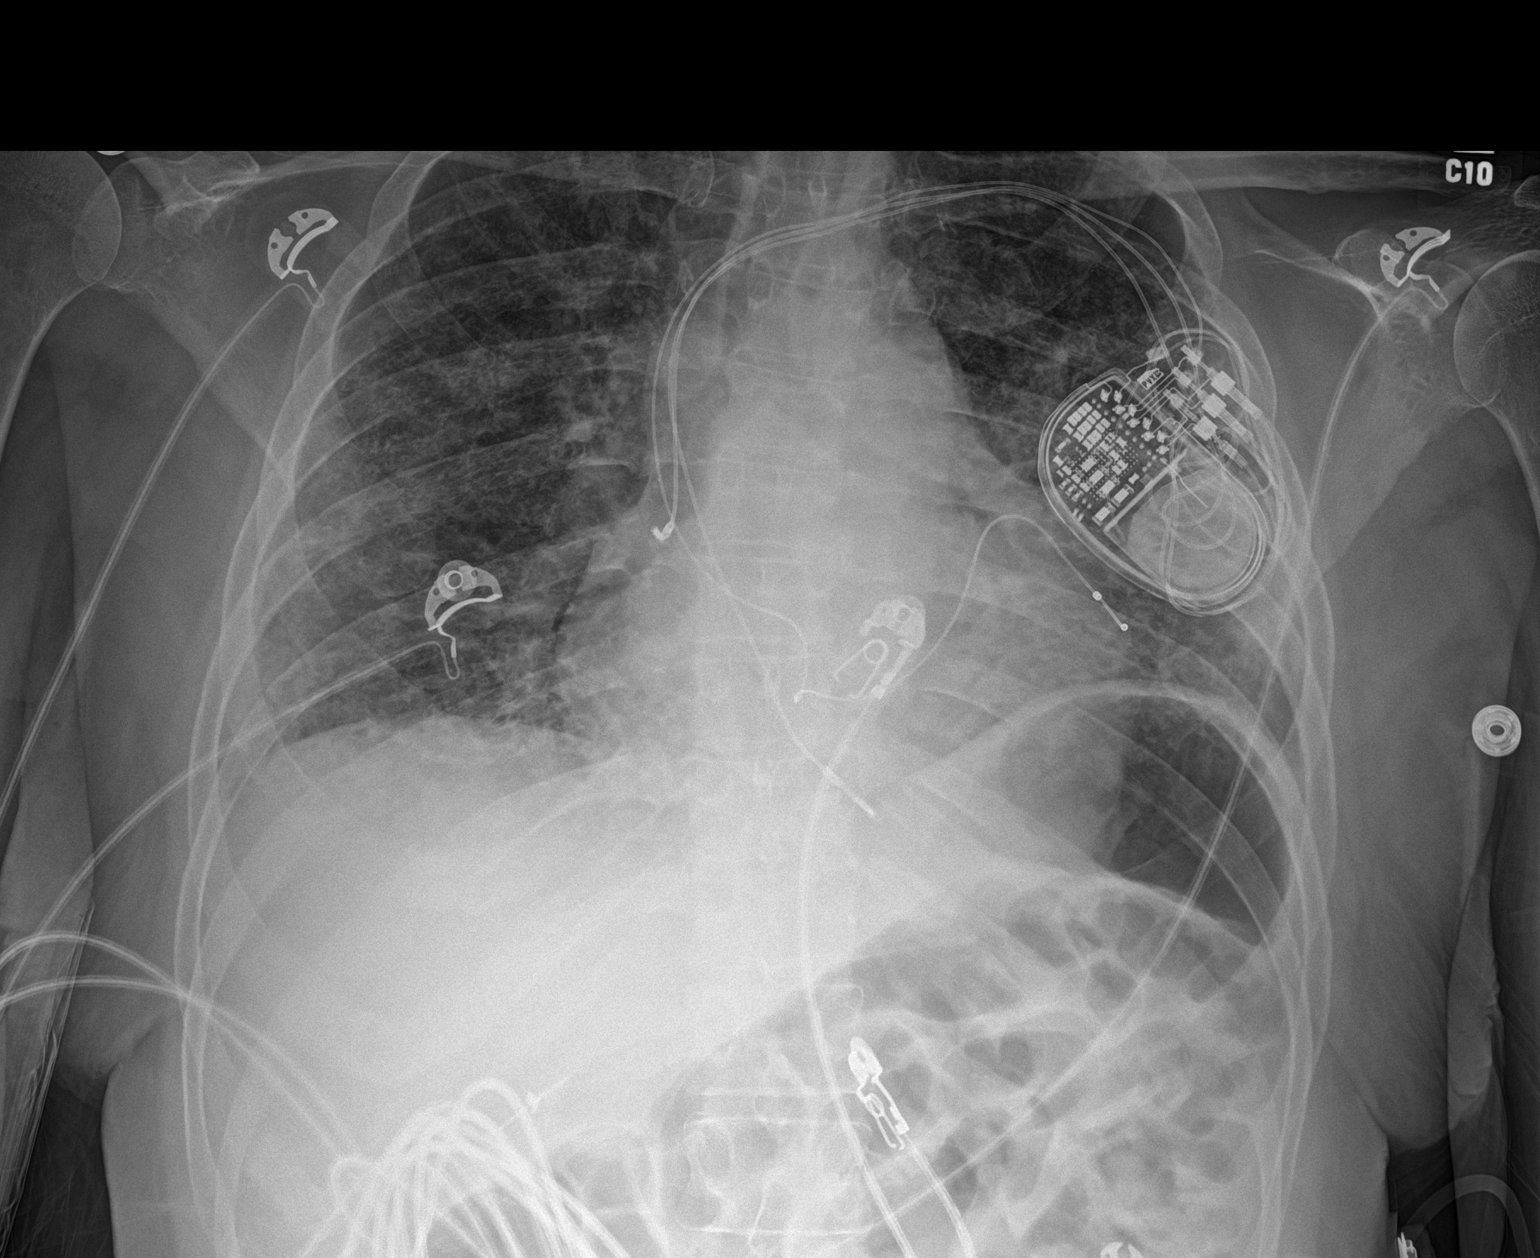

[1 of 1 positions shown; findings below may reference images not displayed]

FINDINGS: Stable cardiomegaly. Stable pacemaker. The hila and mediastinum are
normal. Diffuse interstitial opacities are similar since previous
studies. A recent high-resolution CT scan suggested sarcoidosis. No
other acute interval changes.
IMPRESSION: 1. No interval change in cardiomegaly or diffuse interstitial
opacities. Recent CT imaging suggested sarcoidosis as an underlying
cause. Recommend clinical correlation. No acute change.

## 2021-01-19 IMAGING — CT CT ANGIOGRAPHY CHEST
2 of 4 series · 17 of 46 positions shown · IV contrast (omnipaque)
Comparison: Chest radiography same day. Chest radiography
10/24/2018. Chest CT 09/21/2018
COMPARISON: Chest radiography same day. Chest radiography
10/24/2018. Chest CT 09/21/2018

Addendum:
CLINICAL DATA: Abdominal pain and back pain over the last several
days. Shortness of breath. Recent influenza.

EXAM:
CT ANGIOGRAPHY CHEST WITH CONTRAST
TECHNIQUE: Multidetector CT imaging of the chest was performed using the
standard protocol during bolus administration of intravenous
contrast. Multiplanar CT image reconstructions and MIPs were
obtained to evaluate the vascular anatomy.
CONTRAST:  58mL OMNIPAQUE IOHEXOL 350 MG/ML SOLN

[Series 5: stone study 5.0 i30f 2 · axial · 0.72mm/px · z∈[-562,-128]mm · 14 of 95 slices shown]
[im 4/95  lung]
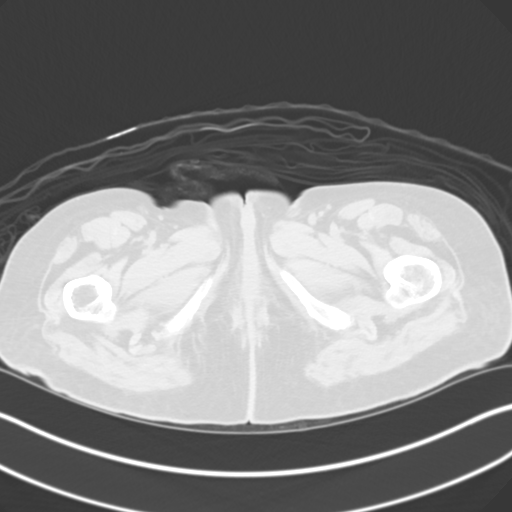
[im 11/95  soft-tissue]
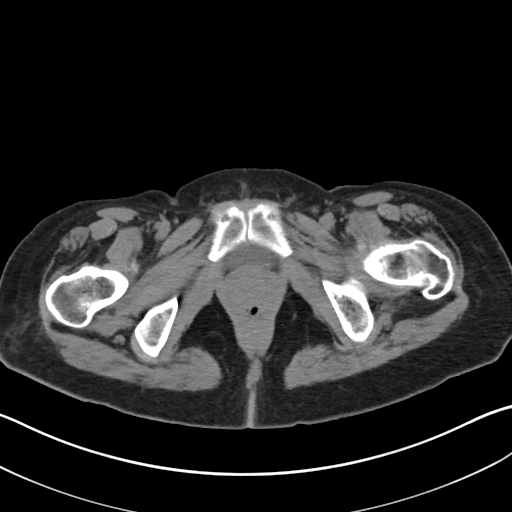
[im 19/95  lung]
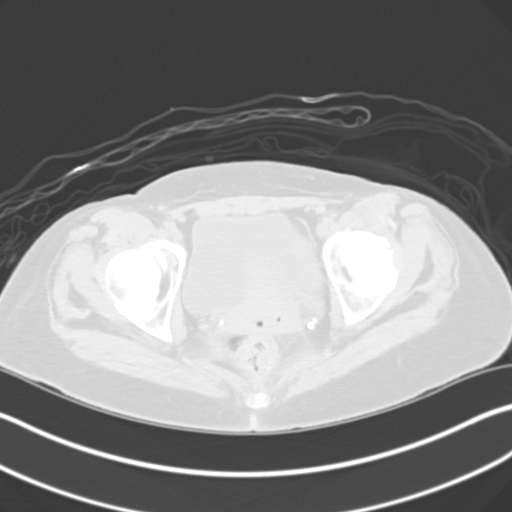
[im 26/95  soft-tissue]
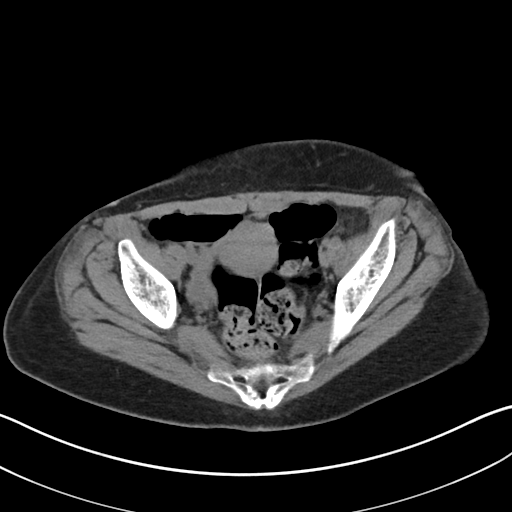
[im 33/95  lung]
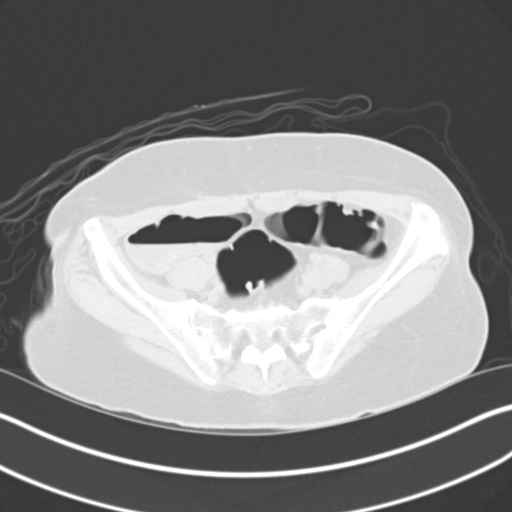
[im 37/95  soft-tissue]
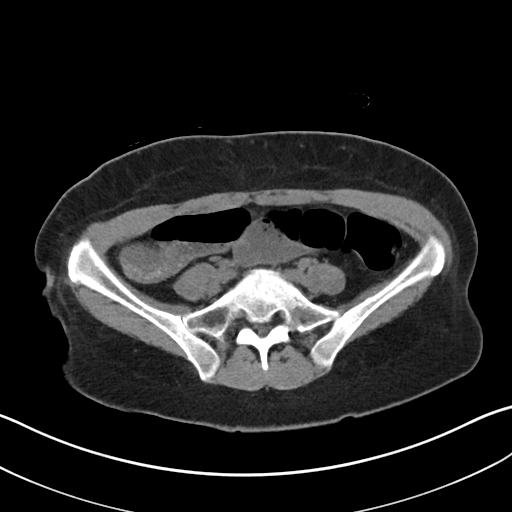
[im 44/95  lung]
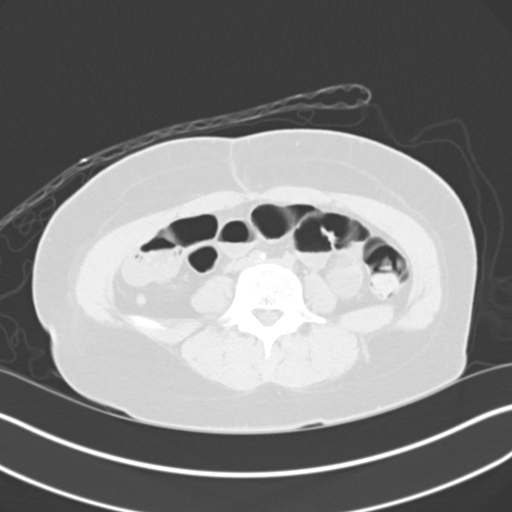
[im 51/95  soft-tissue]
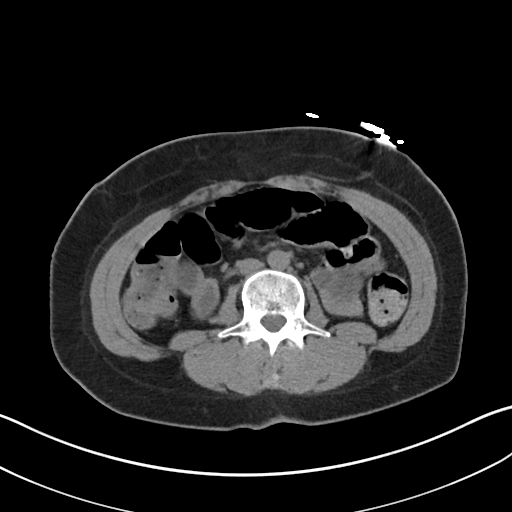
[im 58/95  lung]
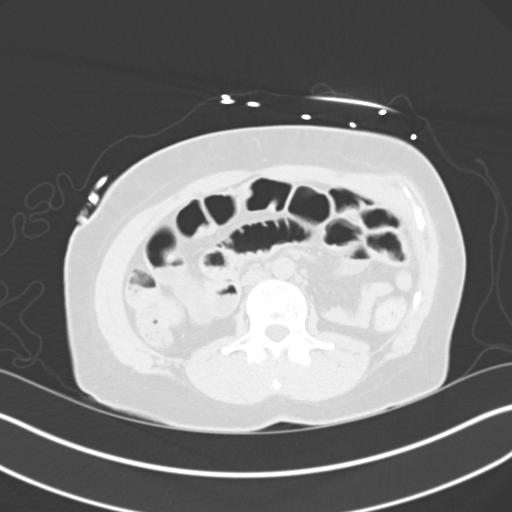
[im 62/95  soft-tissue]
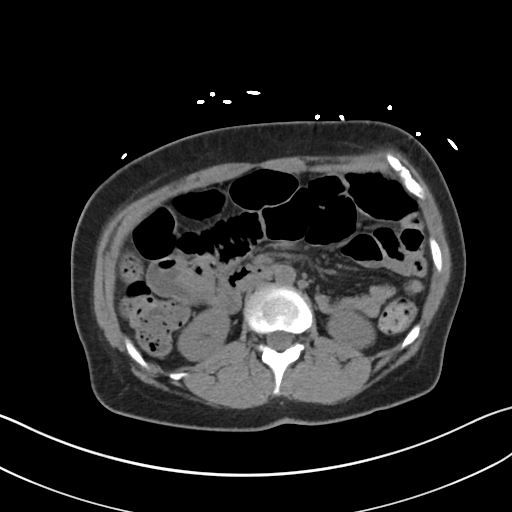
[im 69/95  lung]
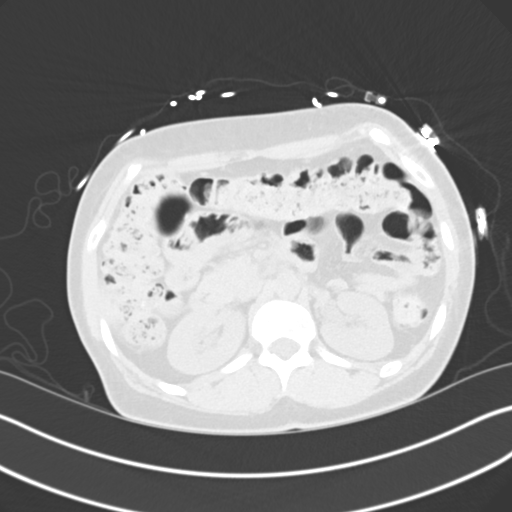
[im 76/95  soft-tissue]
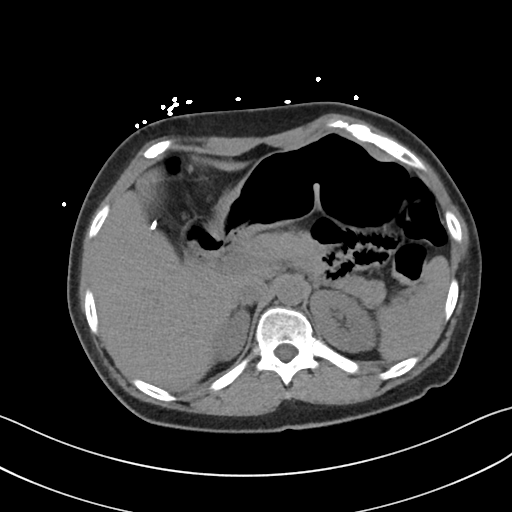
[im 84/95  lung]
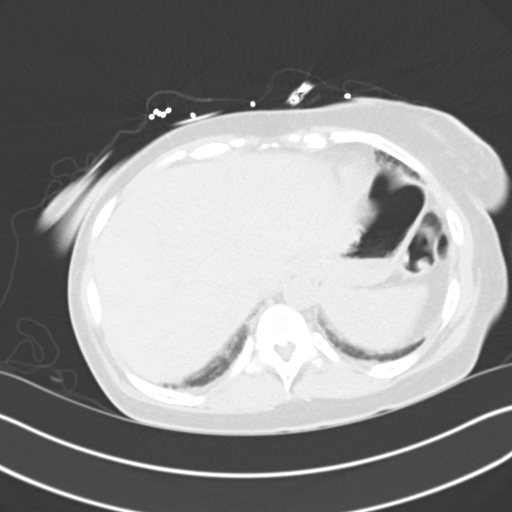
[im 91/95  soft-tissue]
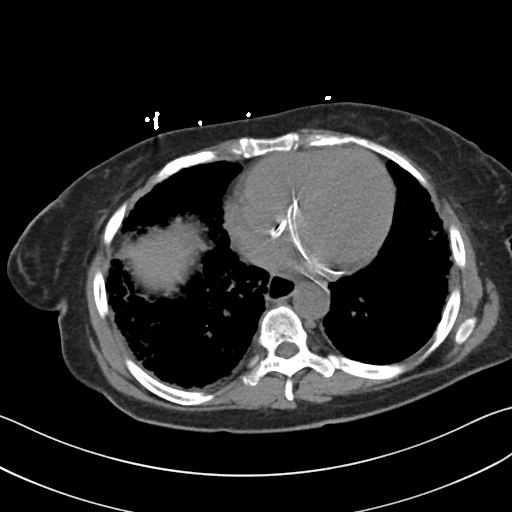

[Series 8: coronal soft tissue · coronal · 0.75mm/px · 3 of 101 slices shown]
[im 34/101  soft-tissue]
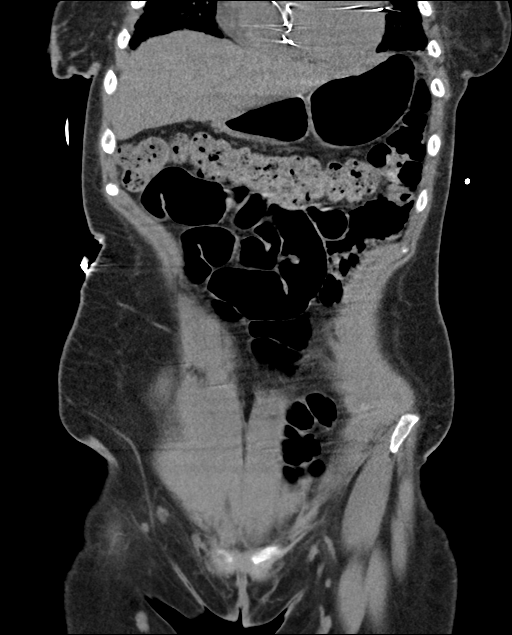
[im 45/101  soft-tissue]
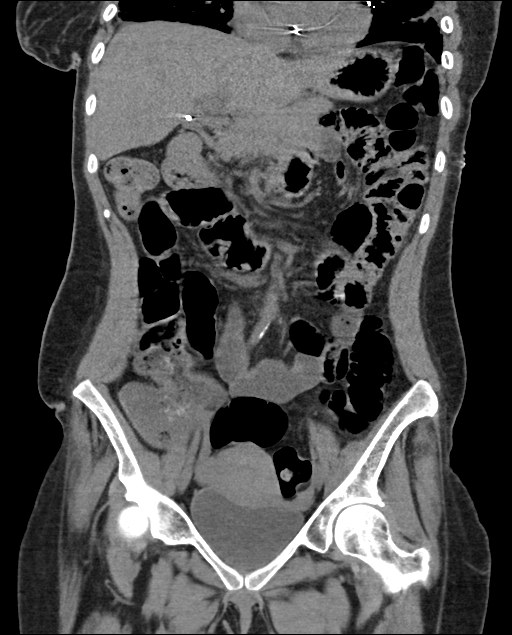
[im 56/101  soft-tissue]
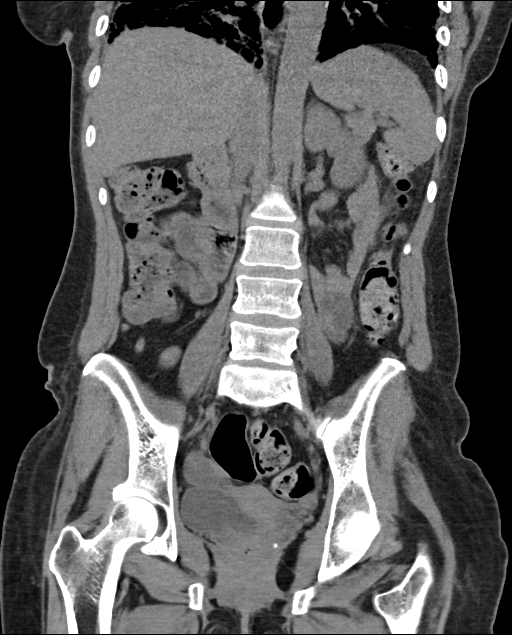

[17 of 46 positions shown; findings below may reference images not displayed]

FINDINGS: Cardiovascular: Pulmonary arterial opacification is satisfactory. No
pulmonary emboli are seen. There is also good systemic arterial
opacification. No evidence of dissection. There is cardiomegaly.
Pacemaker leads in place.

Mediastinum/Nodes: No hilar lymphadenopathy. No paratracheal
lymphadenopathy.

Lungs/Pleura: Chronic interstitial lung disease as described
previously on high-resolution exam. Possible sarcoid. Allowing for
breathing motion, the pattern is similar without evidence of acute
infectious consolidation, lobar collapse or pleural effusion.
Certainly there could be low level inflammatory change.

Upper Abdomen: See results of abdominal CT.

Musculoskeletal: Mild spinal curvature. Congenital anomaly of T12,
[place] vertebra . No acute bone finding.

Review of the MIP images confirms the above findings.
IMPRESSION: No sign of pulmonary emboli. No acute aortic finding. Cardiomegaly
and pacemaker.

Chronic interstitial lung disease, not visibly changed since the
study [DATE]. That high-resolution examination favored sarcoid.
I do not see acute infiltrate, collapse or effusion.

ADDENDUM:
[place] vertebra is T10, not T12.

*** End of Addendum ***
FINDINGS: Cardiovascular: Pulmonary arterial opacification is satisfactory. No
pulmonary emboli are seen. There is also good systemic arterial
opacification. No evidence of dissection. There is cardiomegaly.
Pacemaker leads in place.

Mediastinum/Nodes: No hilar lymphadenopathy. No paratracheal
lymphadenopathy.

Lungs/Pleura: Chronic interstitial lung disease as described
previously on high-resolution exam. Possible sarcoid. Allowing for
breathing motion, the pattern is similar without evidence of acute
infectious consolidation, lobar collapse or pleural effusion.
Certainly there could be low level inflammatory change.

Upper Abdomen: See results of abdominal CT.

Musculoskeletal: Mild spinal curvature. Congenital anomaly of T12,
[place] vertebra . No acute bone finding.

Review of the MIP images confirms the above findings.
IMPRESSION: No sign of pulmonary emboli. No acute aortic finding. Cardiomegaly
and pacemaker.

Chronic interstitial lung disease, not visibly changed since the
study [DATE]. That high-resolution examination favored sarcoid.
I do not see acute infiltrate, collapse or effusion.

## 2021-02-27 IMAGING — DX PORTABLE CHEST - 1 VIEW
1 series · 1 of 1 positions shown · non-contrast
Comparison: 12/27/2018

CLINICAL DATA: Cough with shortness of breath

EXAM:
PORTABLE CHEST 1 VIEW

[chest ap]
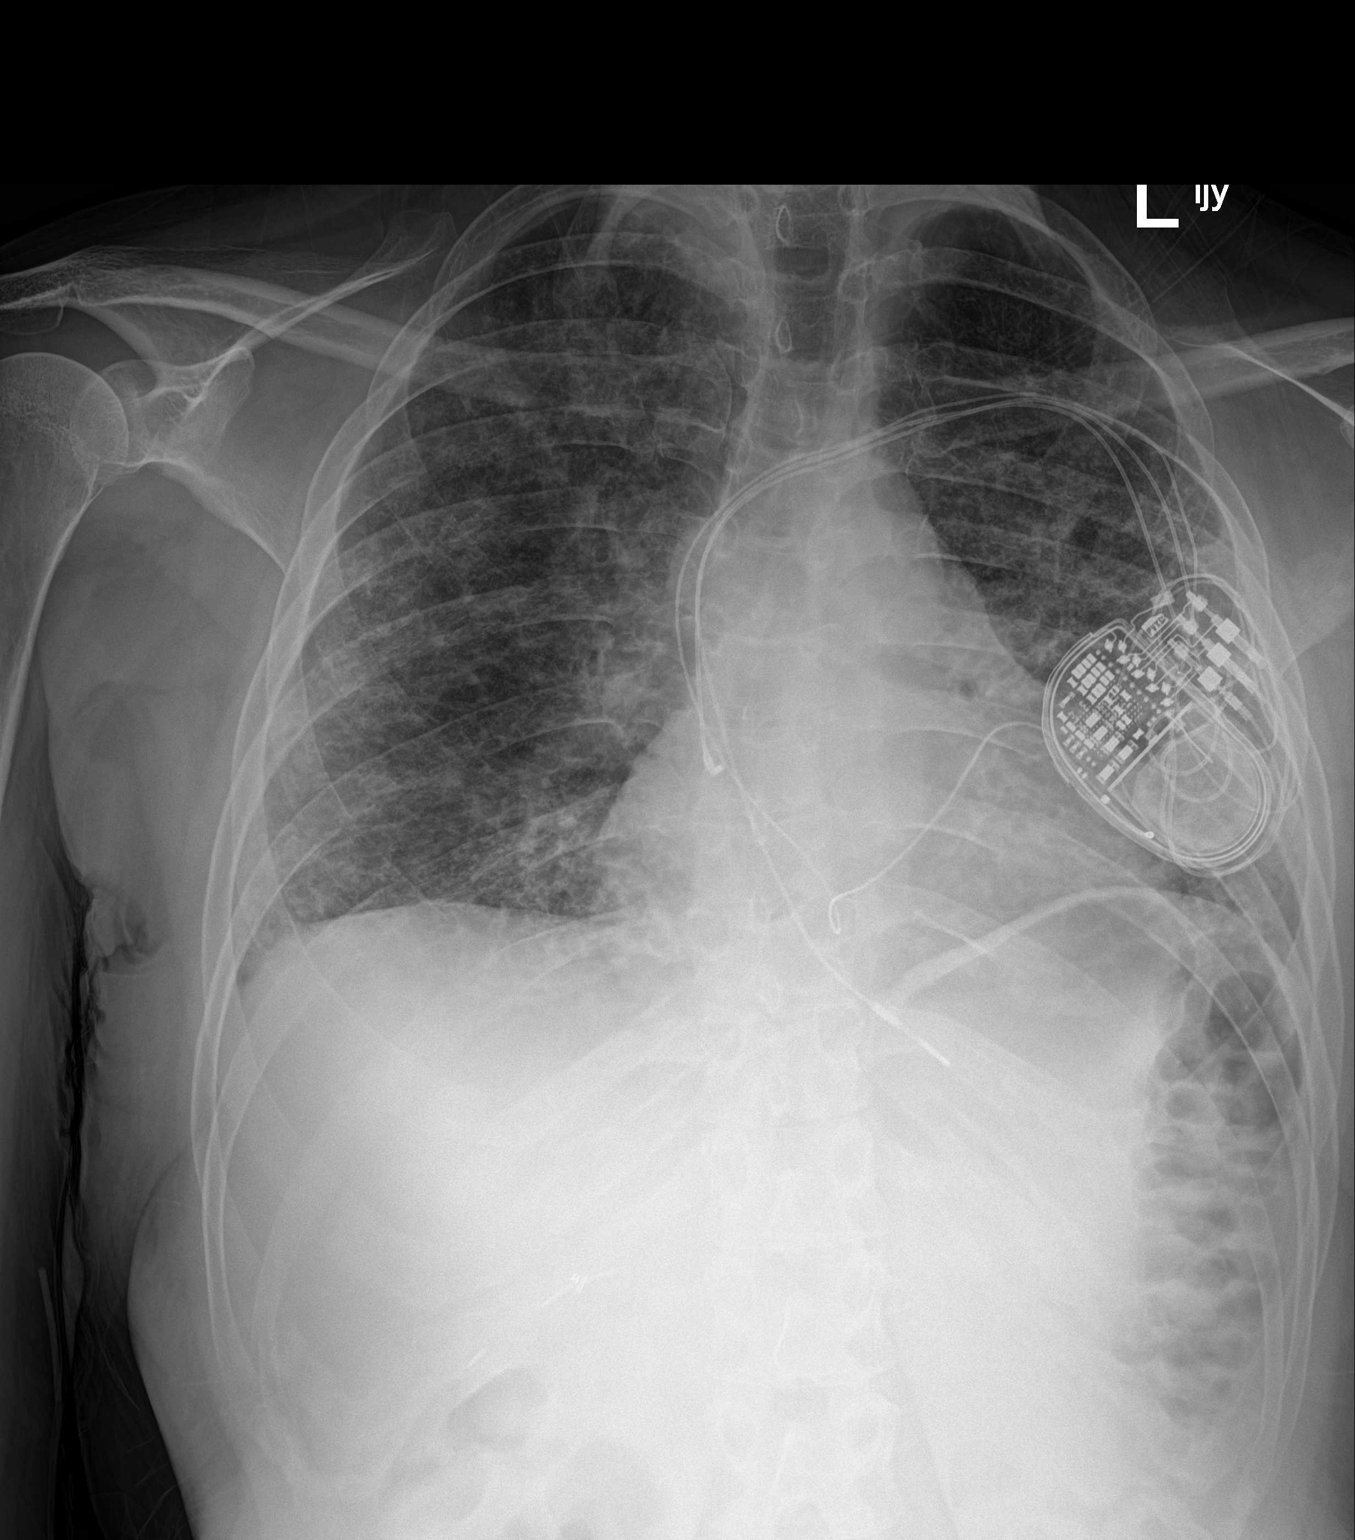

[1 of 1 positions shown; findings below may reference images not displayed]

FINDINGS: Bilateral mild interstitial and alveolar airspace opacities. No
pleural effusion or pneumothorax. Stable cardiomediastinal
silhouette. No acute osseous abnormality.
IMPRESSION: 1. No acute cardiopulmonary disease.
2. Stable chronic interstitial lung disease.

## 2021-02-28 IMAGING — CT CT ANGIOGRAPHY CHEST
2 of 8 series · 18 of 46 positions shown · IV contrast (omnipaque)
Comparison: 02/04/2019, CT from 12/27/2018, 09/21/2018

CLINICAL DATA: Shortness of breath

EXAM:
CT ANGIOGRAPHY CHEST WITH CONTRAST
TECHNIQUE: Multidetector CT imaging of the chest was performed using the
standard protocol during bolus administration of intravenous
contrast. Multiplanar CT image reconstructions and MIPs were
obtained to evaluate the vascular anatomy.
CONTRAST:  100mL OMNIPAQUE IOHEXOL 350 MG/ML SOLN

[Series 6: thins · axial · 0.75mm/px · z∈[-403,-149]mm · 15 of 280 slices shown]
[im 13/280  lung]
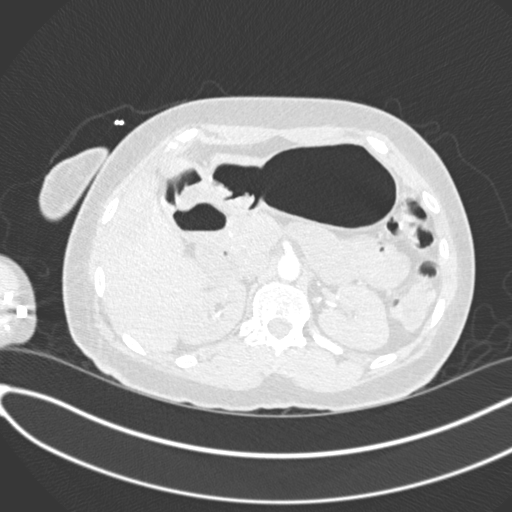
[im 39/280  soft-tissue]
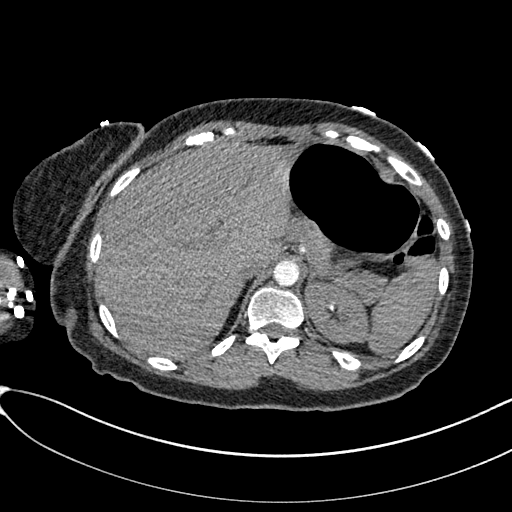
[im 51/280  lung]
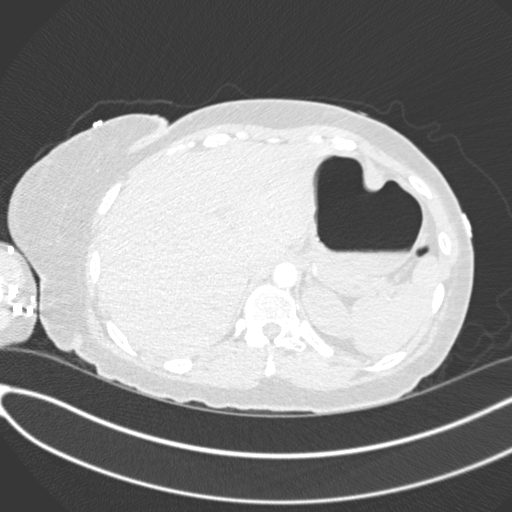
[im 64/280  soft-tissue]
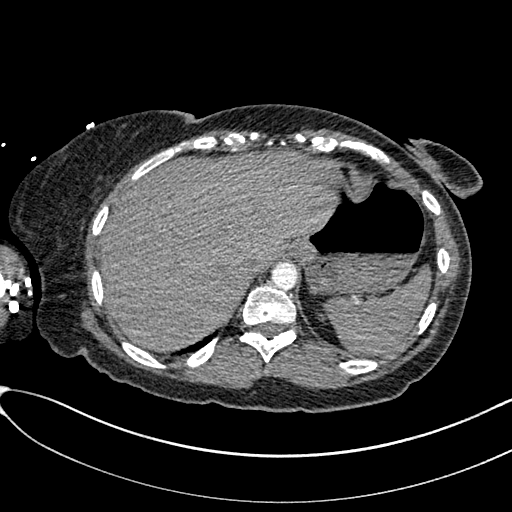
[im 89/280  lung]
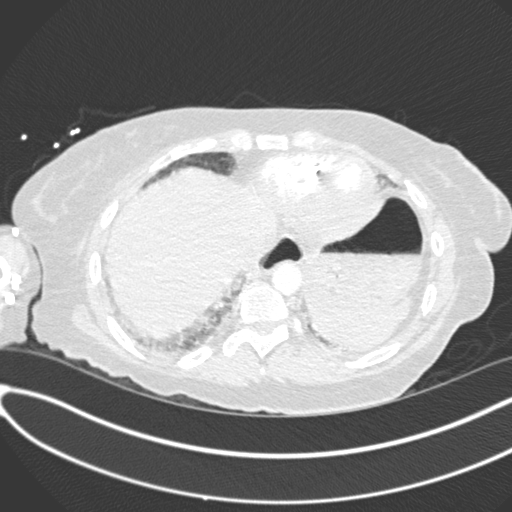
[im 102/280  soft-tissue]
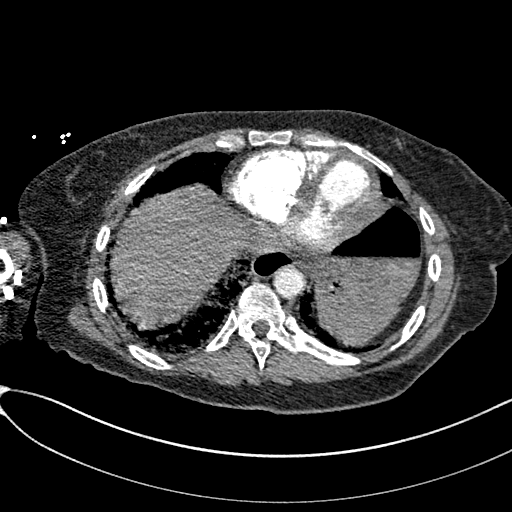
[im 127/280  lung]
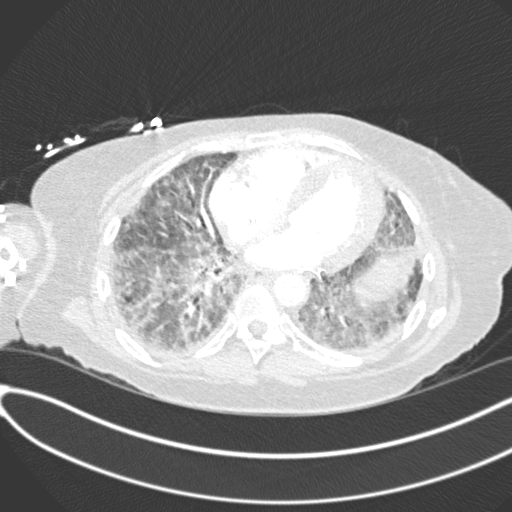
[im 140/280  soft-tissue]
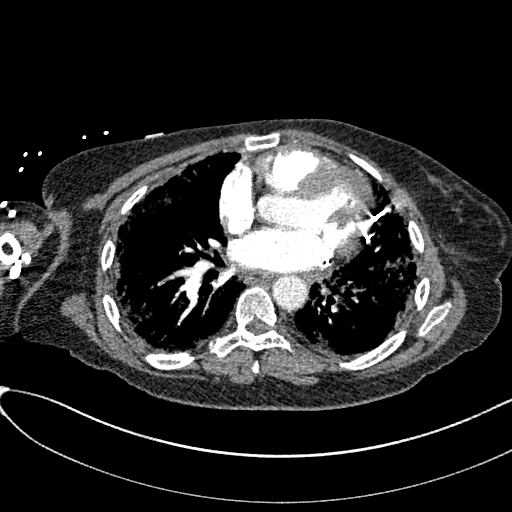
[im 153/280  lung]
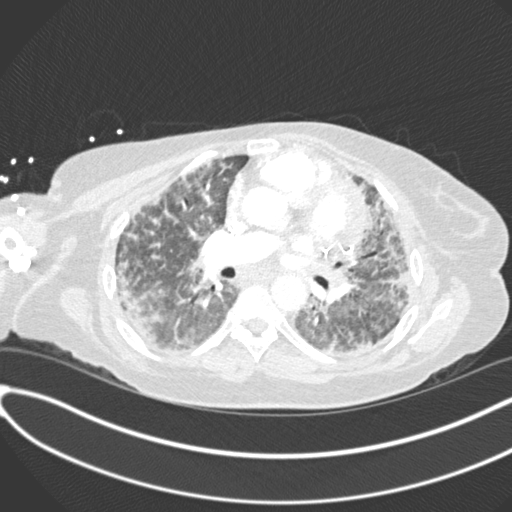
[im 178/280  soft-tissue]
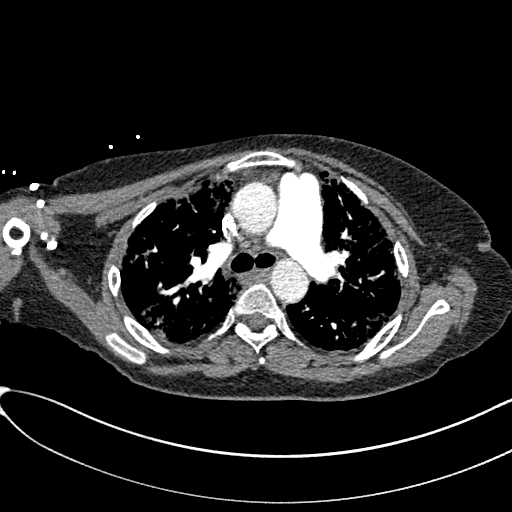
[im 191/280  lung]
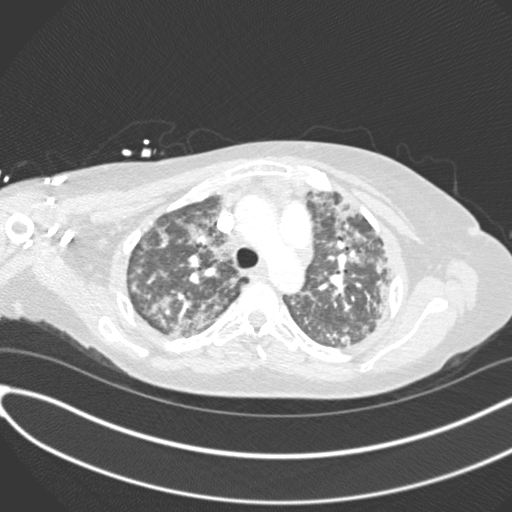
[im 216/280  soft-tissue]
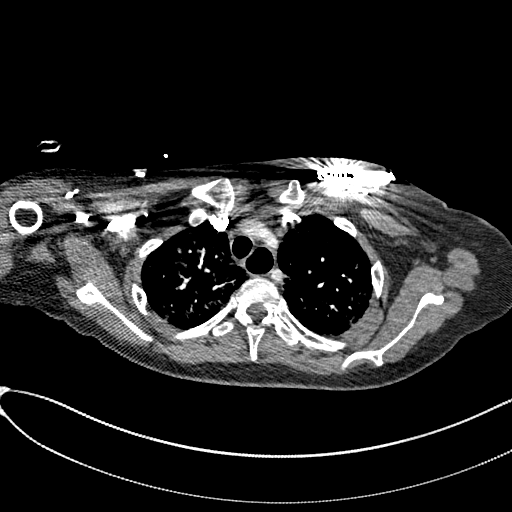
[im 229/280  lung]
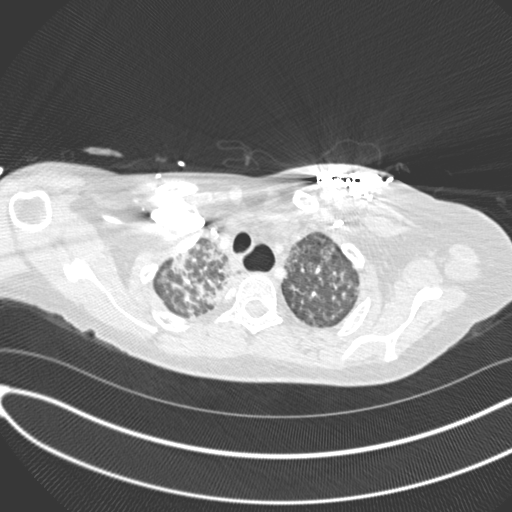
[im 241/280  soft-tissue]
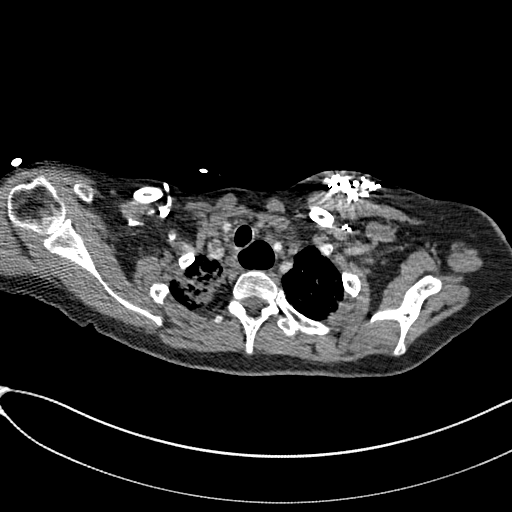
[im 267/280  lung]
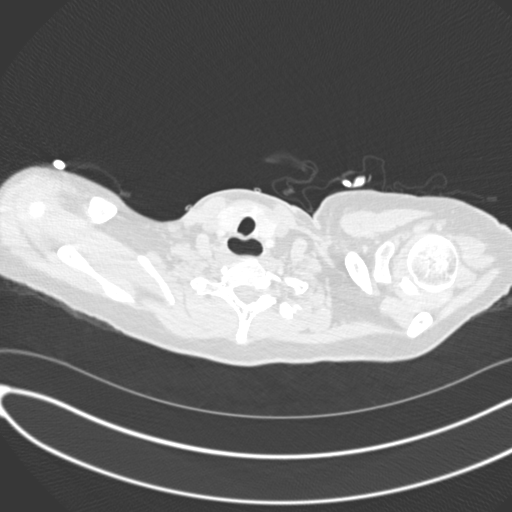

[Series 8: coronal mpr · coronal · 0.60mm/px · 3 of 144 slices shown]
[im 36/144  soft-tissue]
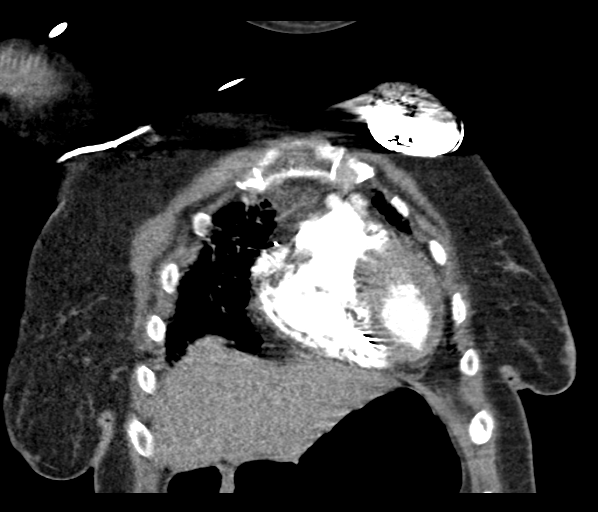
[im 72/144  soft-tissue]
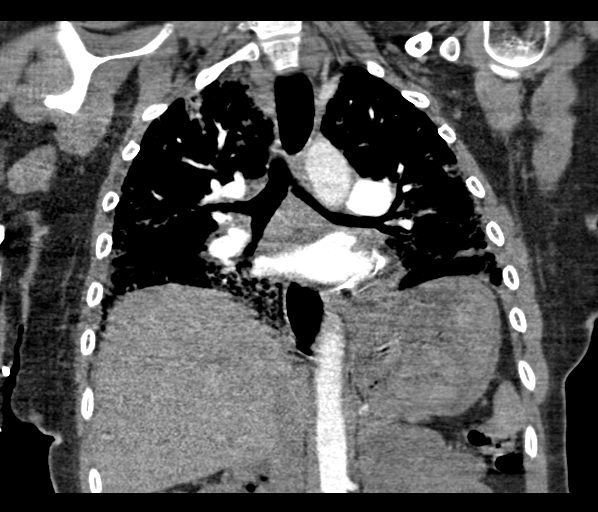
[im 108/144  soft-tissue]
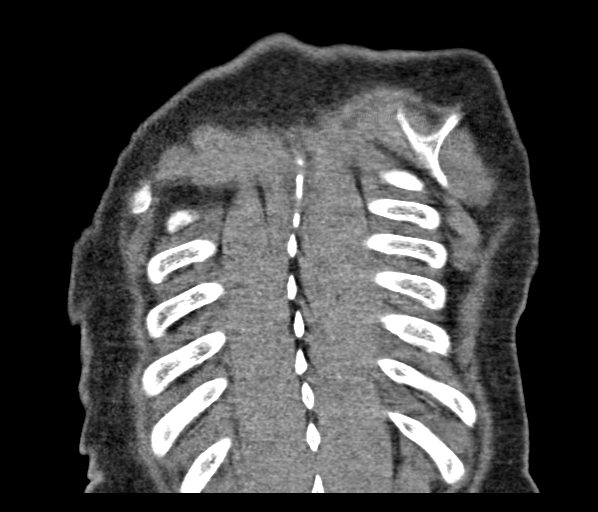

[18 of 46 positions shown; findings below may reference images not displayed]

FINDINGS: Cardiovascular: Thoracic aorta demonstrates a normal branching
pattern. No significant atherosclerotic calcifications are seen. No
aneurysmal dilatation or dissection is noted. No cardiac enlargement
is seen. Pacing device is again noted. The pulmonary artery shows a
normal branching pattern without intraluminal filling defect to
suggest pulmonary embolism.

Mediastinum/Nodes: The thoracic inlet is within normal limits.
Stable mediastinal adenopathy is noted dating back to Wednesday September, 2018. The esophagus as visualized is within normal limits.

Lungs/Pleura: Lungs are well aerated bilaterally. Chronic
interstitial changes with nodularity are noted similar to that seen
on the prior exam. No new focal infiltrate or sizable effusion is
noted. No new parenchymal nodules are seen.

Upper Abdomen: Gallbladder has been surgically removed. The
remainder of the upper abdomen is within normal limits.

Musculoskeletal: Degenerative changes of the thoracic spine are
seen. No acute bony abnormality is noted. Congenital deformity of
T11 is noted stable from the previous exams.

Review of the MIP images confirms the above findings.
IMPRESSION: No evidence of pulmonary emboli.

Chronic stable interstitial changes with nodularity throughout both
lungs may represent sarcoidosis.

No new focal abnormality is seen.

## 2021-03-19 IMAGING — DX PORTABLE CHEST - 1 VIEW
1 series · 1 of 1 positions shown · non-contrast
Comparison: 02/04/2019

CLINICAL DATA: Shortness of breath

EXAM:
PORTABLE CHEST 1 VIEW

[chest ap]
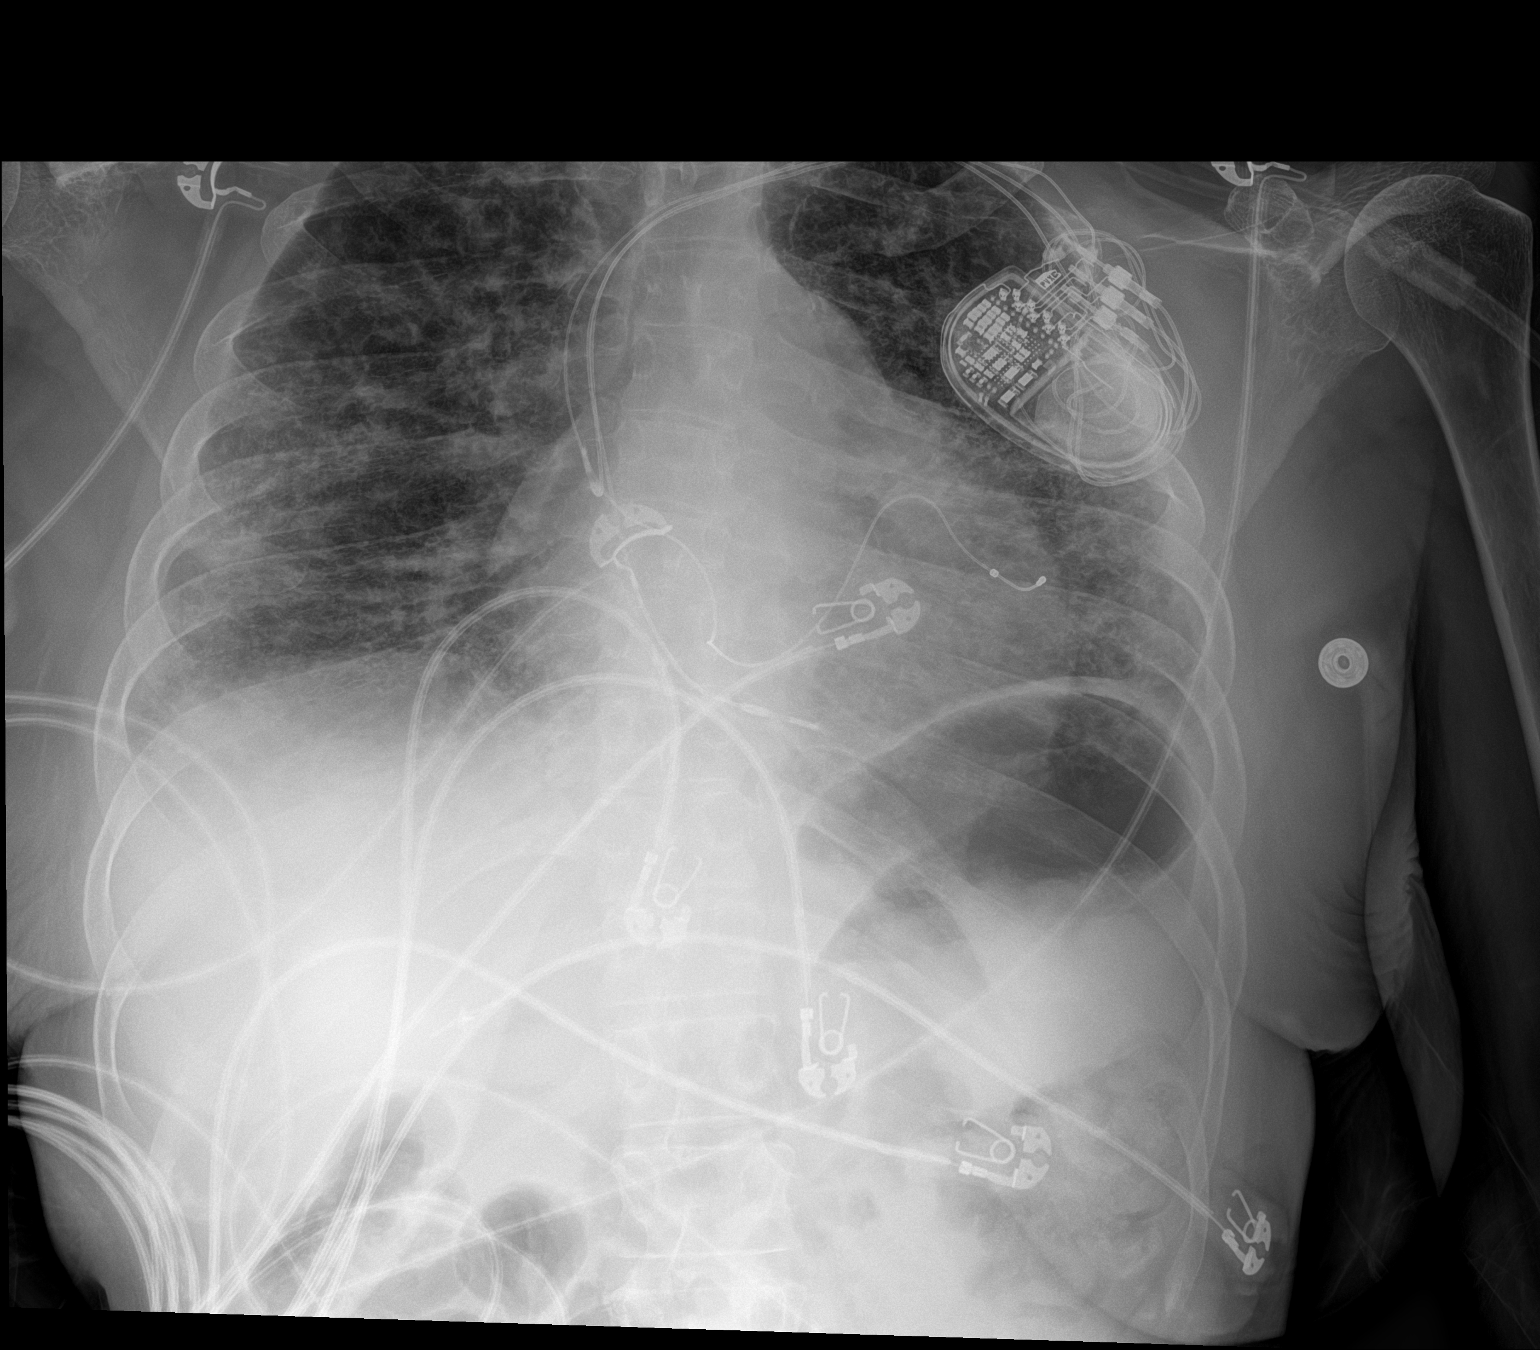

[1 of 1 positions shown; findings below may reference images not displayed]

FINDINGS: Left pacer remains in place, unchanged. Cardiomegaly. Stable chronic
interstitial prominence throughout the lungs. No definite acute
process. No effusions or acute bony abnormality.
IMPRESSION: Cardiomegaly.

Stable chronic interstitial lung disease.

## 2021-07-18 IMAGING — DX DG CHEST 1V PORT
1 series · 1 of 1 positions shown · non-contrast
Comparison: 02/24/2019

CLINICAL DATA: Cough, dyspnea

EXAM:
PORTABLE CHEST 1 VIEW

[chest]
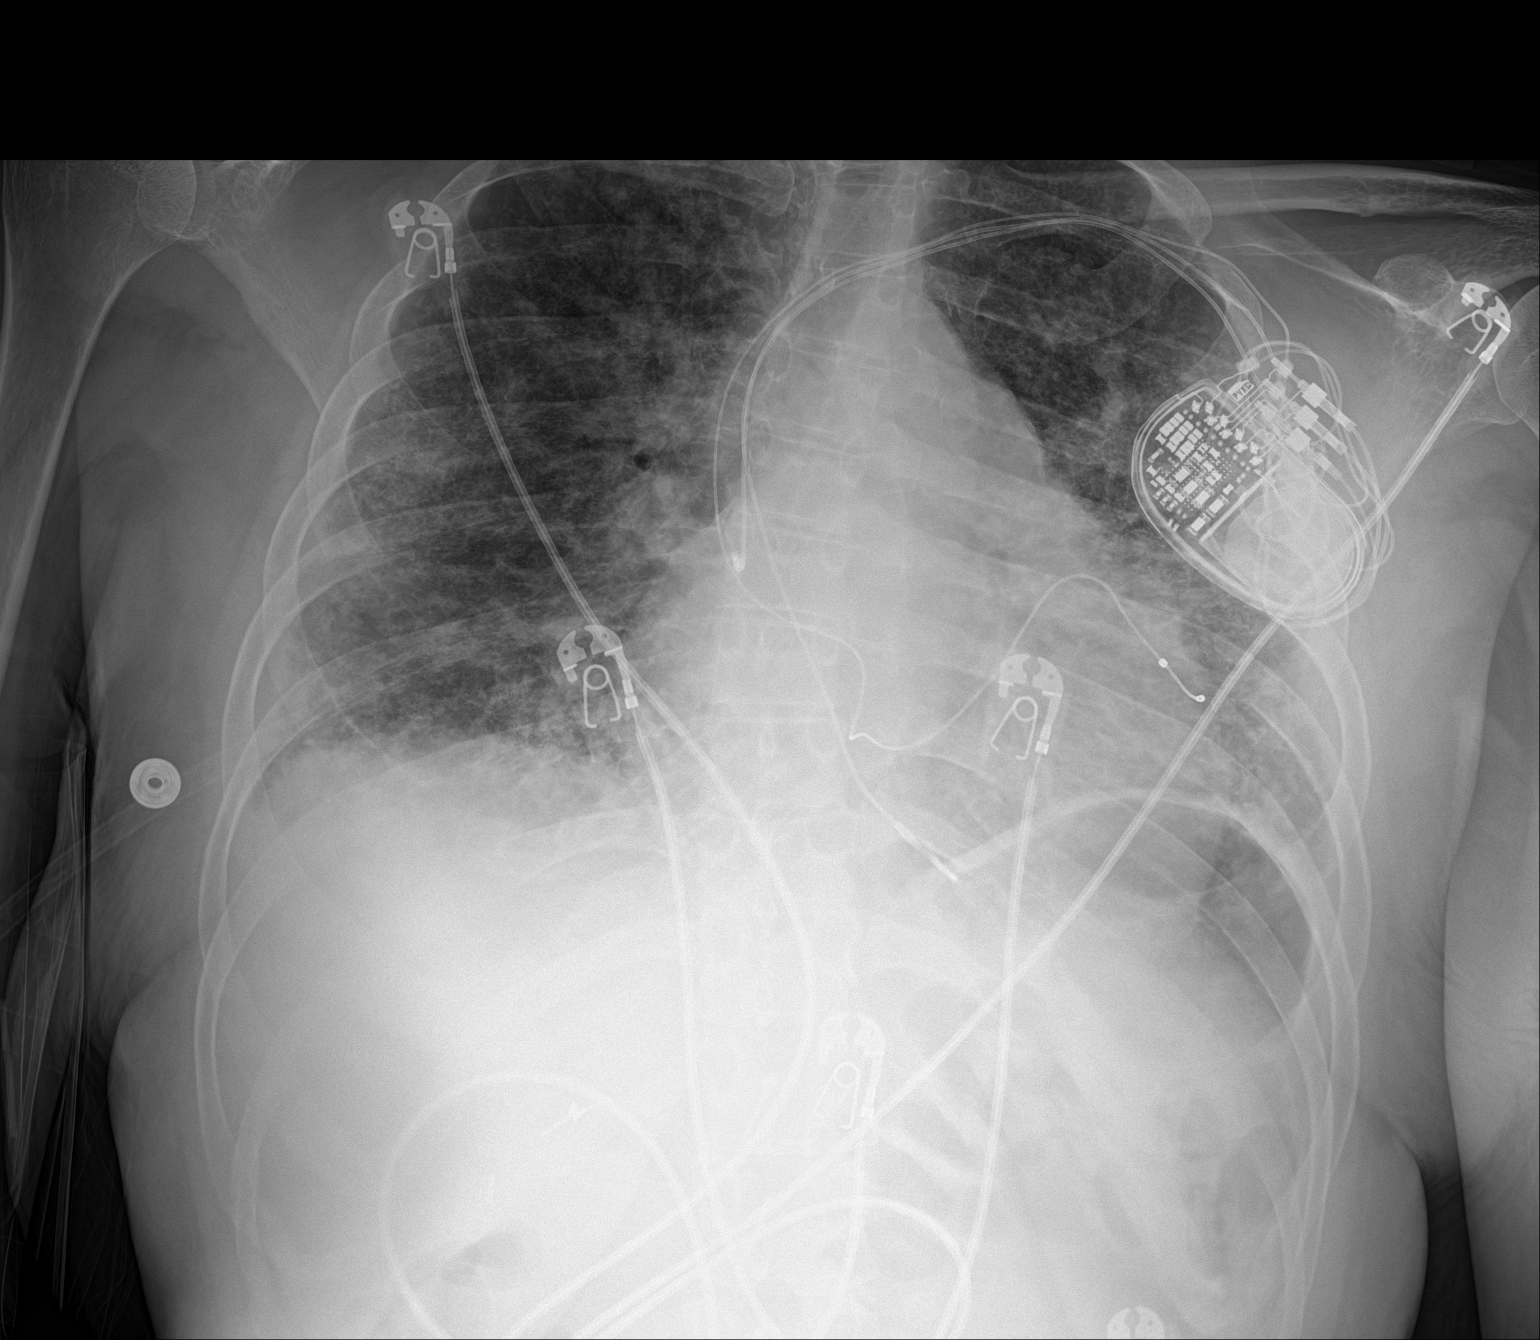

[1 of 1 positions shown; findings below may reference images not displayed]

FINDINGS: Left-sided implanted cardiac device remains. Stable
cardiomediastinal contours. Chronic interstitial changes throughout
both lungs. No superimposed focal airspace opacity identified. No
pleural effusion or pneumothorax.
IMPRESSION: Chronic interstitial lung changes. No superimposed airspace
consolidation identified.

## 2021-10-27 IMAGING — CR DG CHEST 2V
2 series · 2 of 2 positions shown · non-contrast
Comparison: June 25, 2019 and February 24, 2019 chest radiograph; CT
angiogram chest December 27, 2018

CLINICAL DATA: Shortness of breath.  Hypertension.

EXAM:
CHEST - 2 VIEW

[w chest pa]
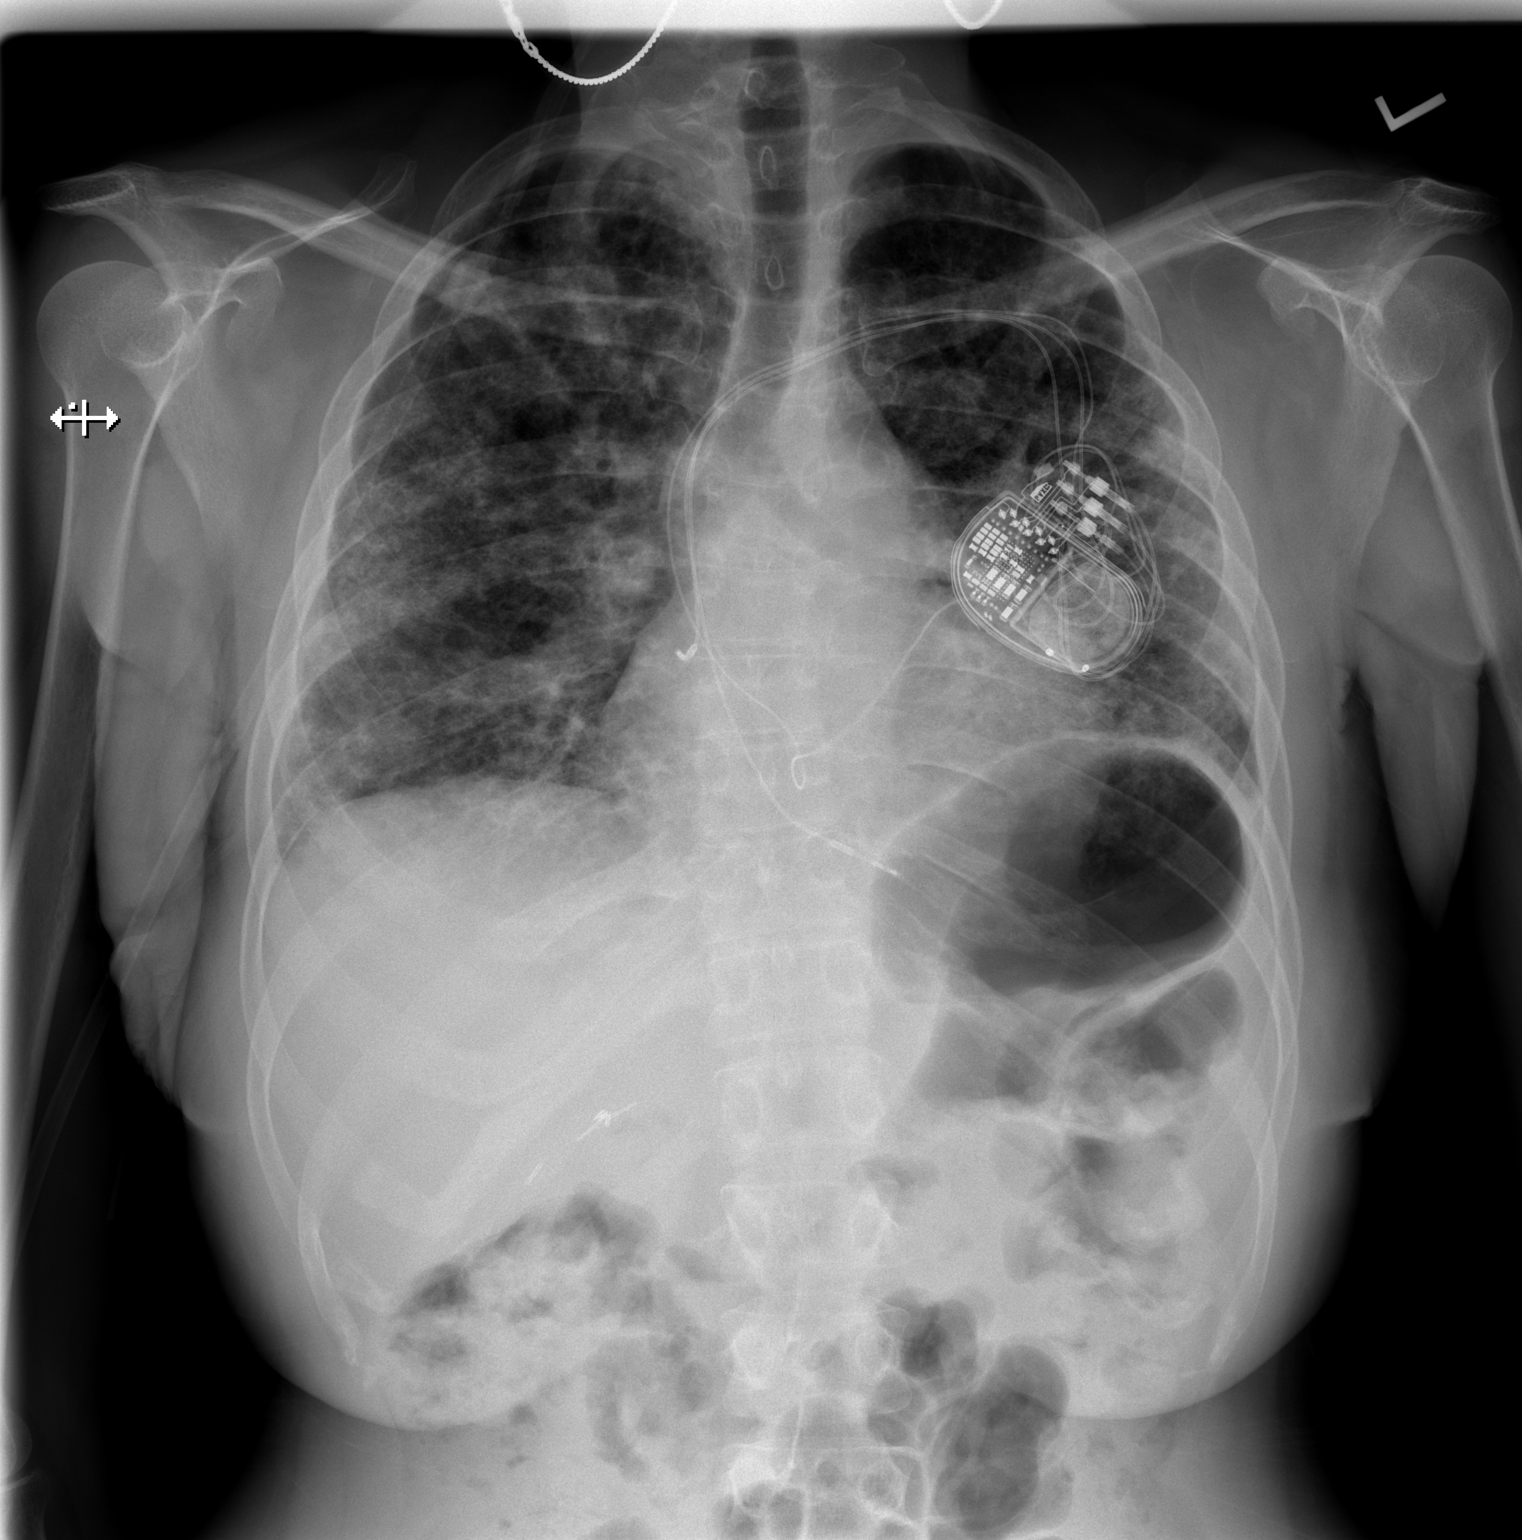

[w chest lat]
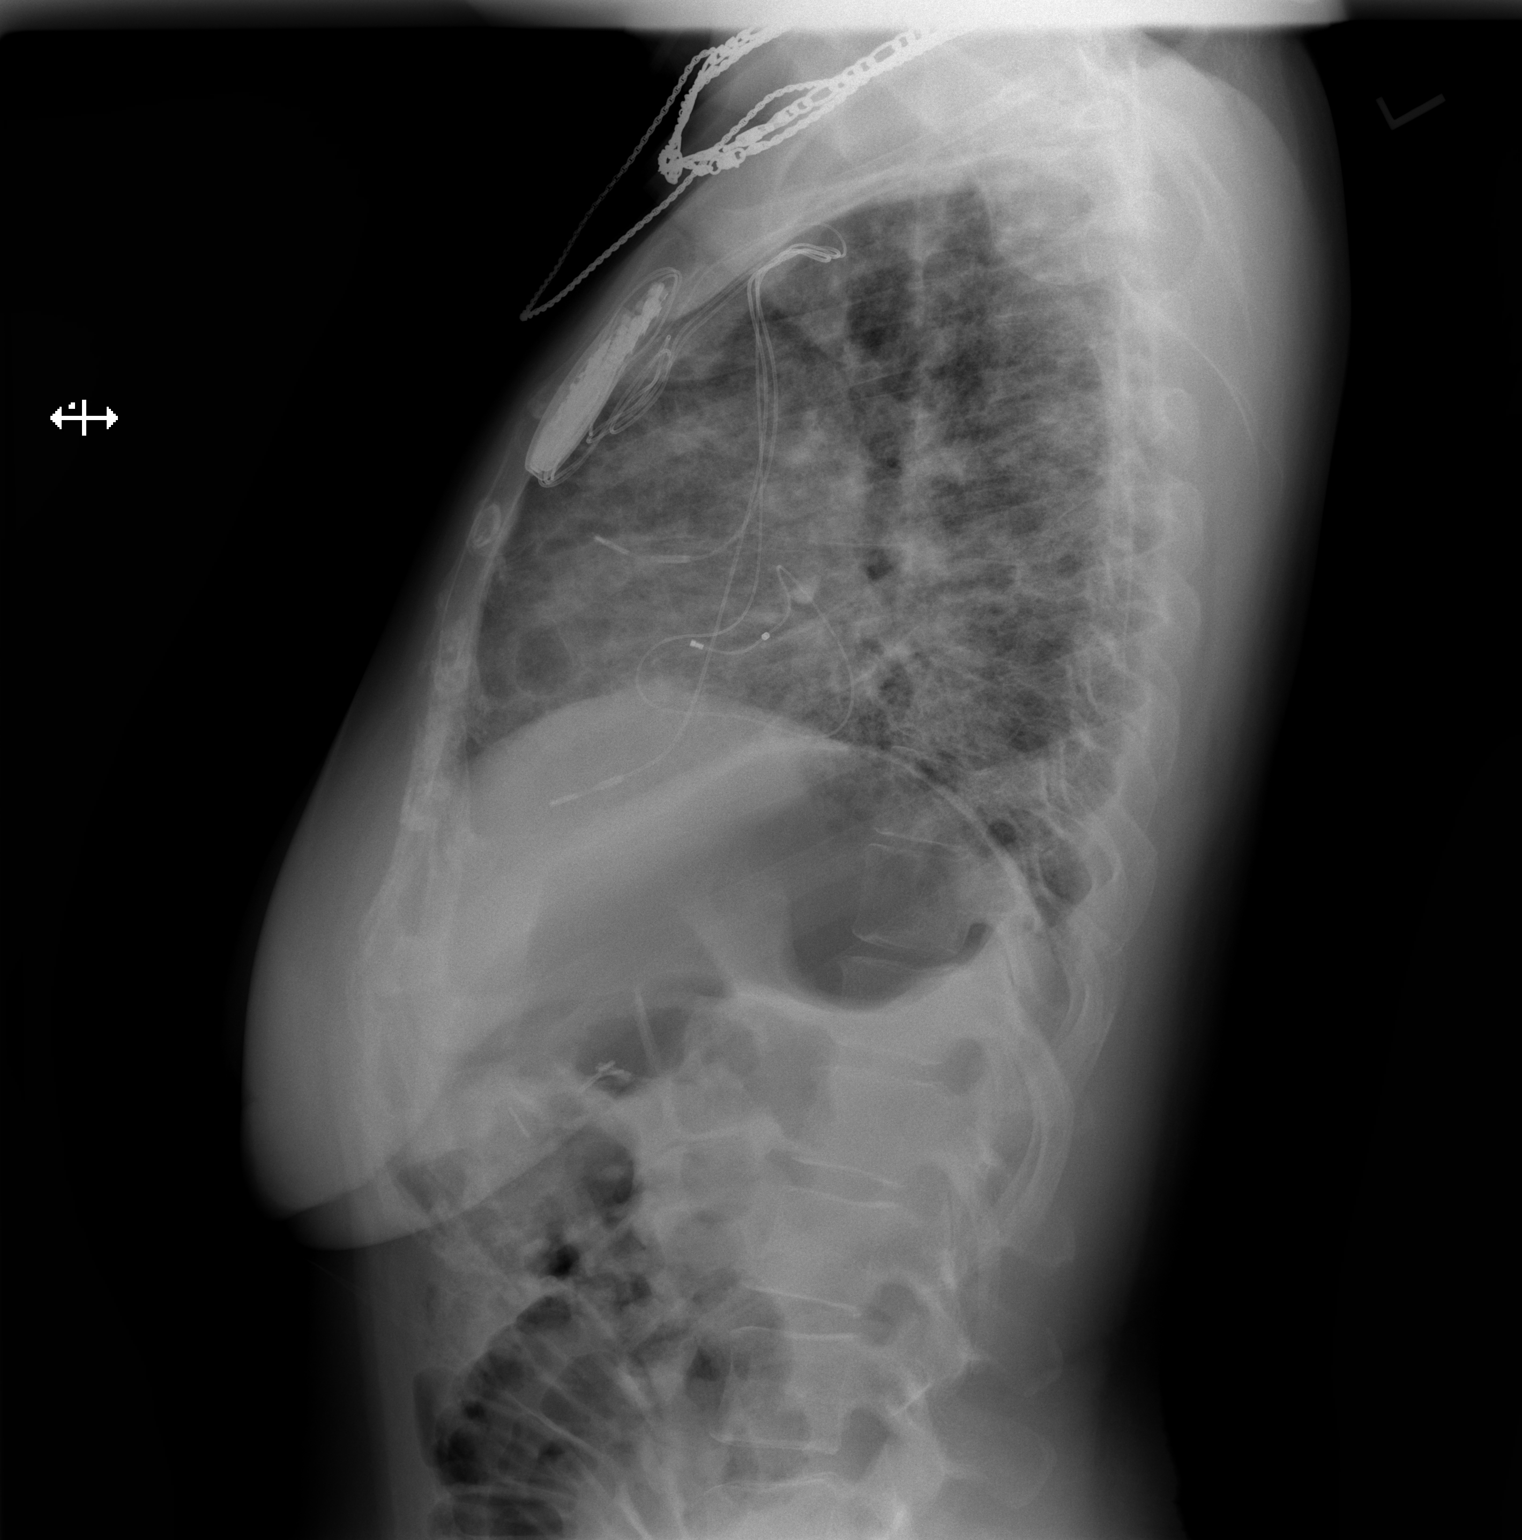

[2 of 2 positions shown; findings below may reference images not displayed]

FINDINGS: Extensive underlying fibrotic change remains stable. No new opacity
evident. Heart is borderline prominent with pulmonary vascularity
normal. Pacemaker leads are attached to the right atrium, right
ventricle, and coronary sinus. No adenopathy. No bone lesions.
IMPRESSION: Stable extensive underlying fibrotic type change. No frank edema or
consolidation. Subtle infiltrate superimposed on this degree of
interstitial fibrosis is difficult to exclude entirely.

Stable cardiac silhouette. Stable pacemaker lead positions. No
adenopathy evident.

## 2021-11-15 IMAGING — DX DG CHEST 1V PORT
1 series · 1 of 1 positions shown · non-contrast
Comparison: Prior radiograph from 10/04/2019.

CLINICAL DATA: Initial evaluation for acute shortness of breath.

EXAM:
PORTABLE CHEST 1 VIEW

[chest]
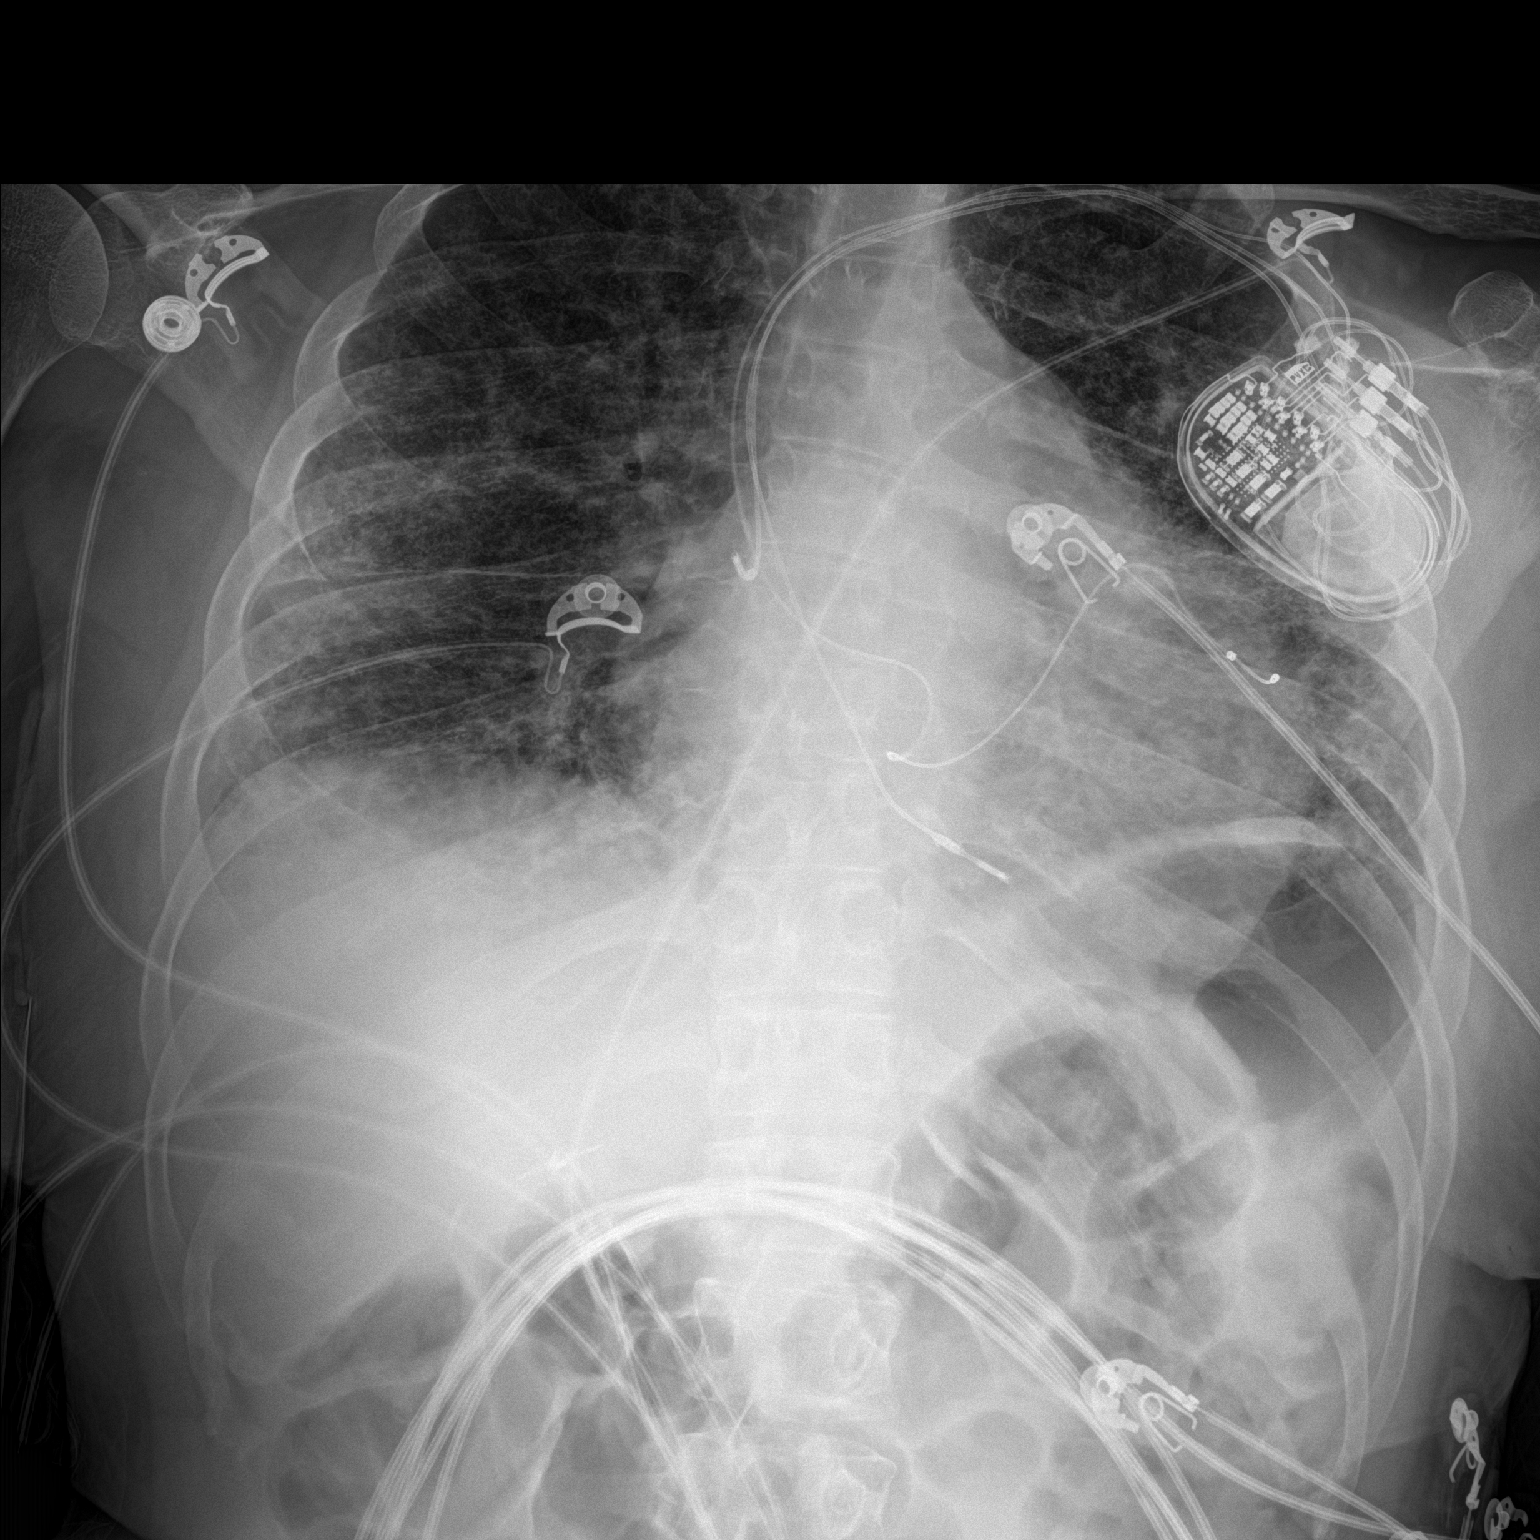

[1 of 1 positions shown; findings below may reference images not displayed]

FINDINGS: Left-sided pacemaker/AICD in place, stable. Cardiomegaly, unchanged.
Mediastinal silhouette within normal limits.

Lung volumes are reduced. Extensive chronic coarsening of the
interstitial markings with fibrotic changes, relatively unchanged.
No definite superimposed focal airspace disease. No overt pulmonary
edema. No pleural effusion. No pneumothorax.

Osseous structures unchanged.
IMPRESSION: 1. Stable appearance of the chest with extensive chronic coarsening
of the interstitial markings and fibrotic lung changes. No definite
superimposed active cardiopulmonary process identified.
2. Stable cardiomegaly without overt pulmonary edema.

## 2021-12-03 IMAGING — DX DG CHEST 1V PORT
1 series · 1 of 1 positions shown · non-contrast
Comparison: Chest radiograph dated 10/23/2019 and several
additional prior studies dating back to the CT of 09/21/2018.

CLINICAL DATA: 38-year-old female with weakness. Shortness of
breath and cough. Positive [id number]. history of chronic interstitial
lung disease.

EXAM:
PORTABLE CHEST 1 VIEW

[chest]
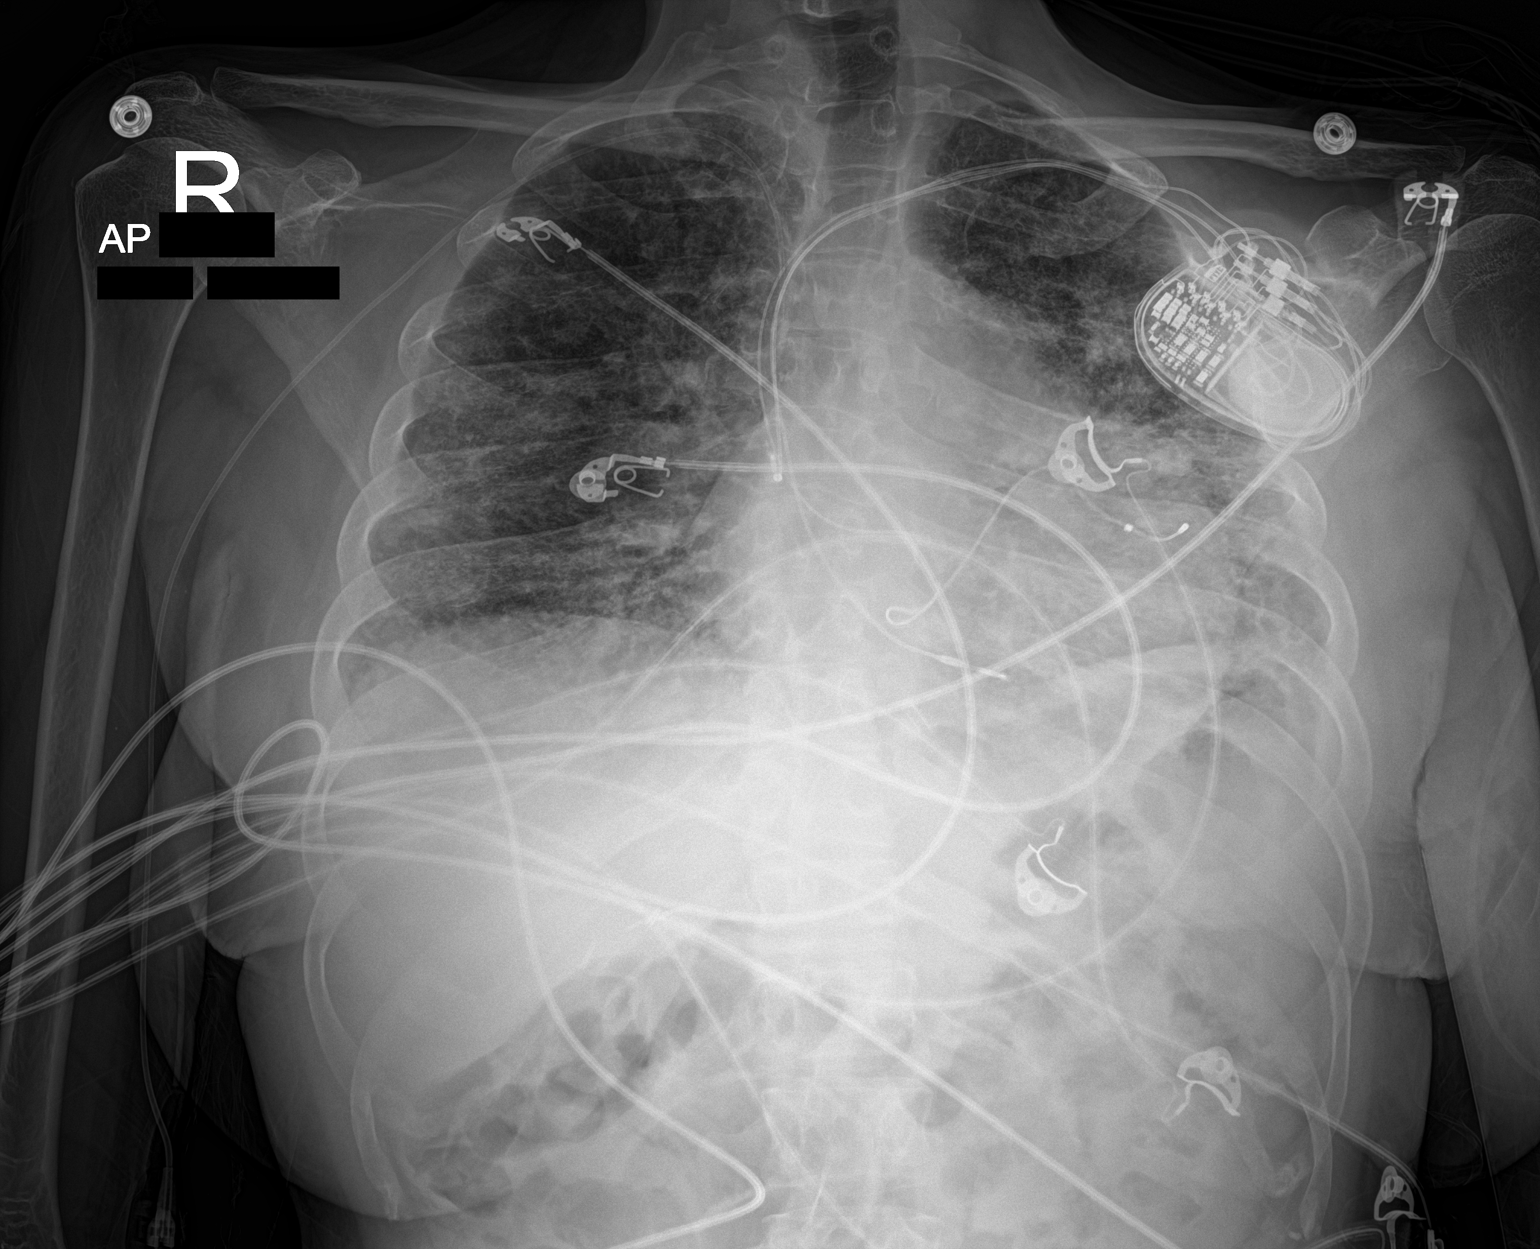

[1 of 1 positions shown; findings below may reference images not displayed]

FINDINGS: A right-sided PICC with tip over central SVC noted. There is diffuse
interstitial coarsening in keeping with known fibrosis. No new
consolidative changes. There is no pleural effusion or pneumothorax.
Stable cardiac silhouette. Left pectoral pacemaker device. No acute
osseous pathology.
IMPRESSION: 1. No new consolidative changes.  No interval change.
2. Pulmonary fibrosis.

## 2021-12-03 IMAGING — CT CT ANGIO CHEST
2 of 6 series · 17 of 46 positions shown · IV contrast (omnipaque)
Comparison: CTA chest, 04/08/2018.

CLINICAL DATA: Malaise. Nausea vomiting for 2 days. Recent
admission for sepsis.

EXAM:
CT ANGIOGRAPHY CHEST WITH CONTRAST
TECHNIQUE: Multidetector CT imaging of the chest was performed using the
standard protocol during bolus administration of intravenous
contrast. Multiplanar CT image reconstructions and MIPs were
obtained to evaluate the vascular anatomy.
CONTRAST:  100mL OMNIPAQUE IOHEXOL 350 MG/ML SOLN

[Series 6: thins · axial · 0.71mm/px · z∈[+1111,+1328]mm · 14 of 239 slices shown]
[im 11/239  lung]
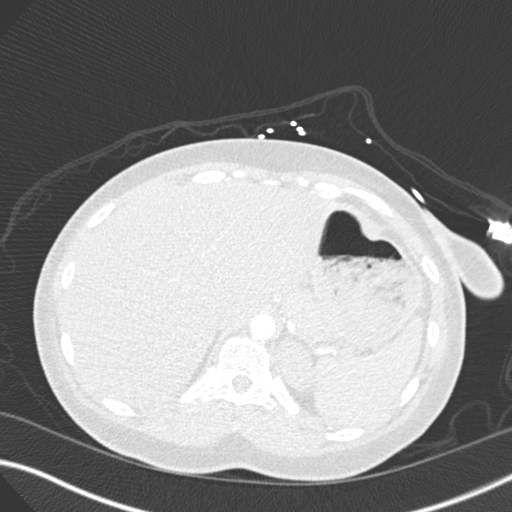
[im 32/239  soft-tissue]
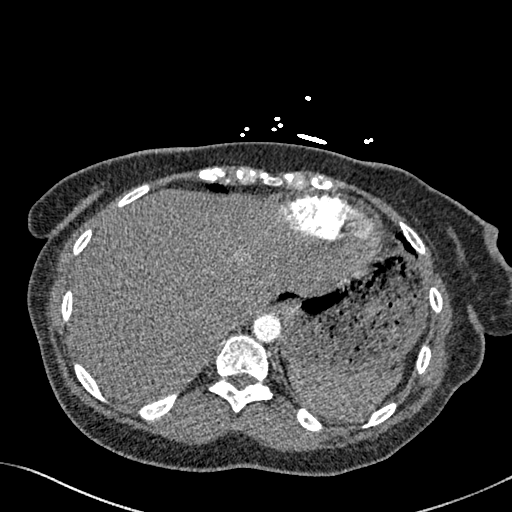
[im 42/239  lung]
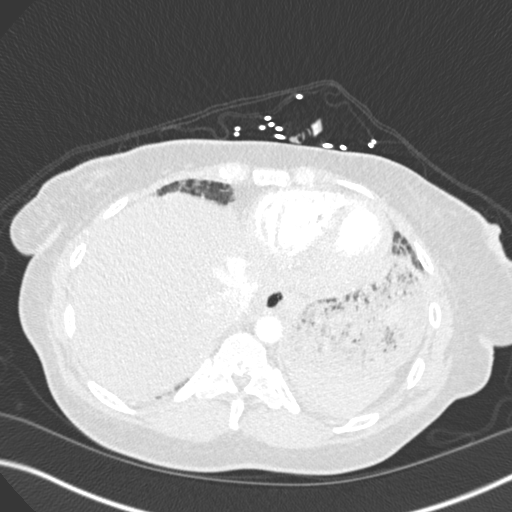
[im 63/239  soft-tissue]
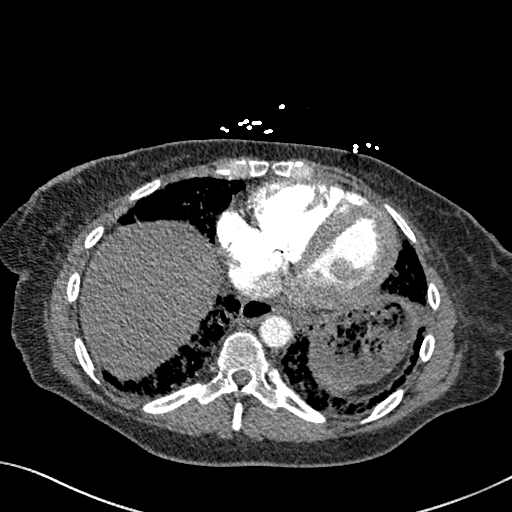
[im 83/239  lung]
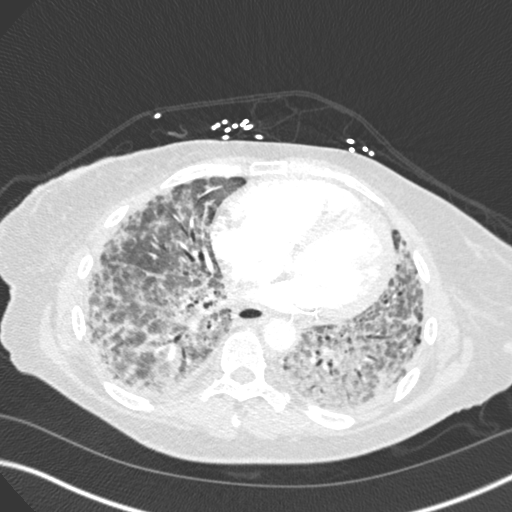
[im 94/239  soft-tissue]
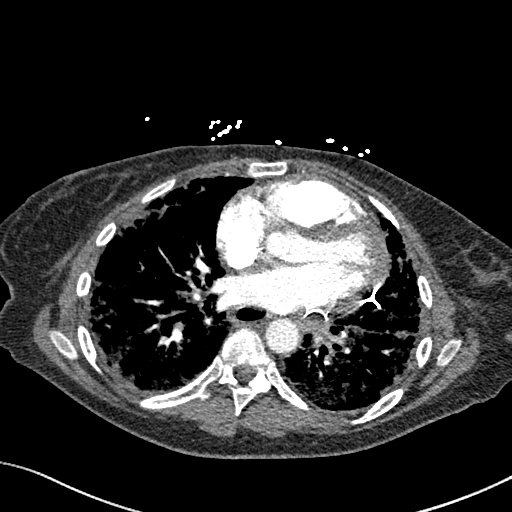
[im 114/239  lung]
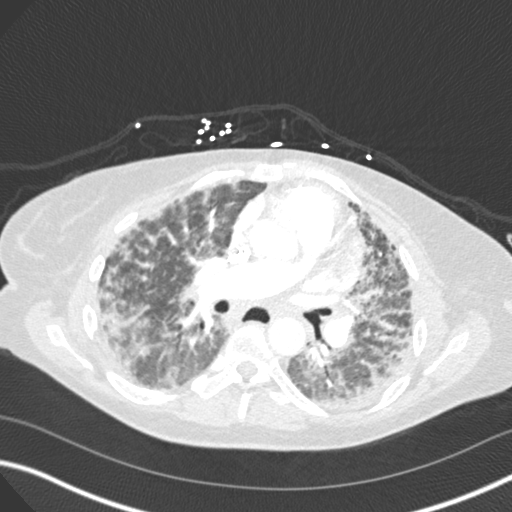
[im 125/239  soft-tissue]
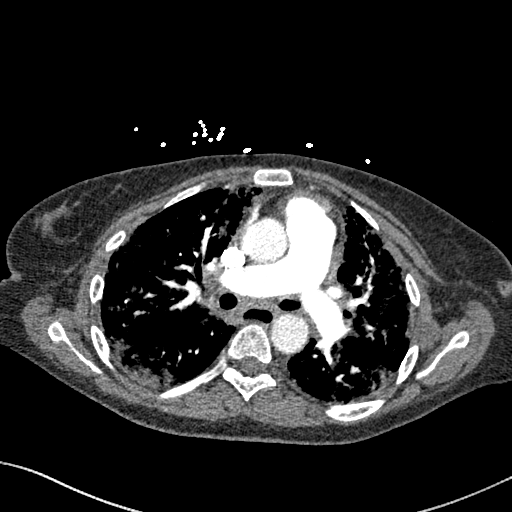
[im 145/239  lung]
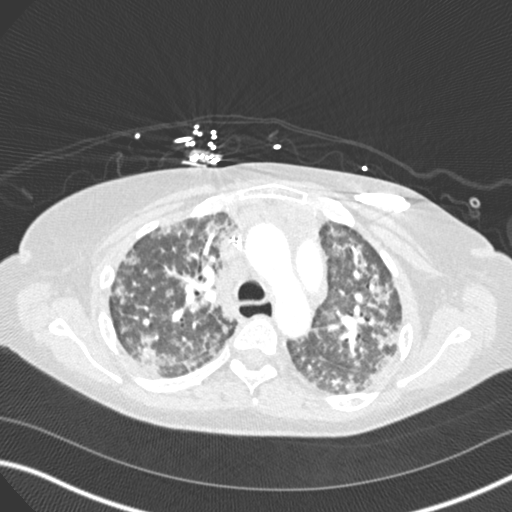
[im 156/239  soft-tissue]
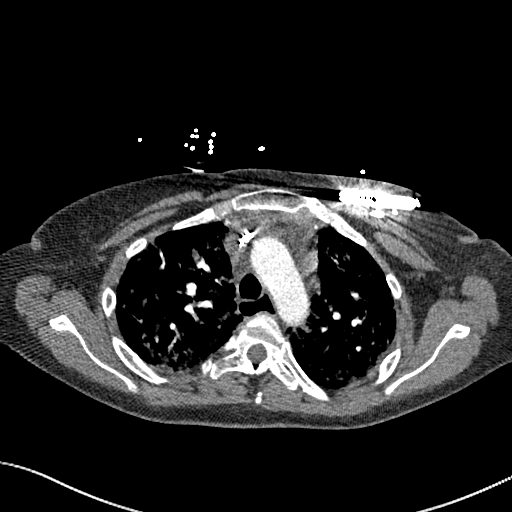
[im 176/239  lung]
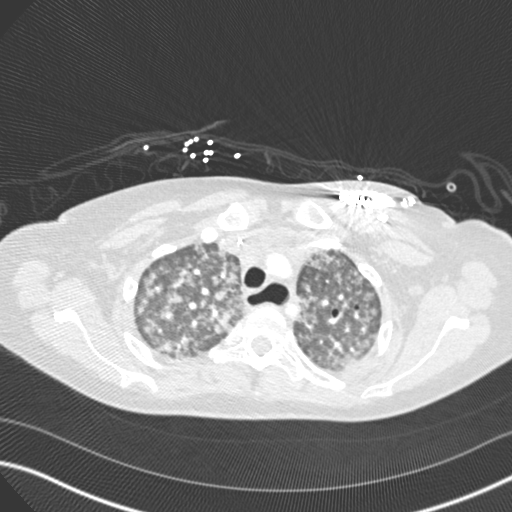
[im 197/239  soft-tissue]
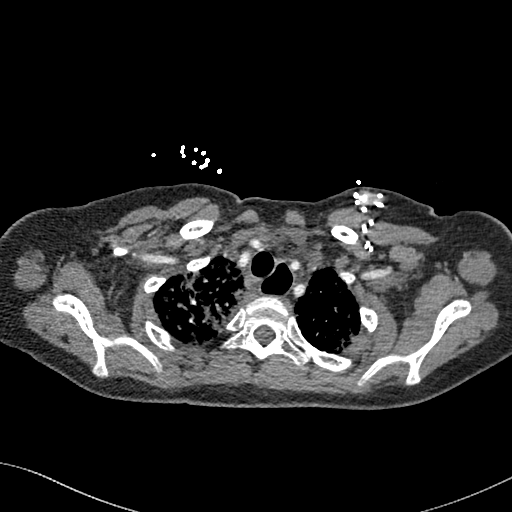
[im 207/239  lung]
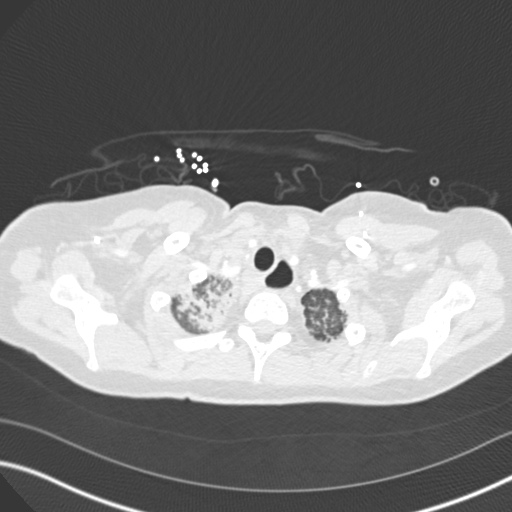
[im 228/239  soft-tissue]
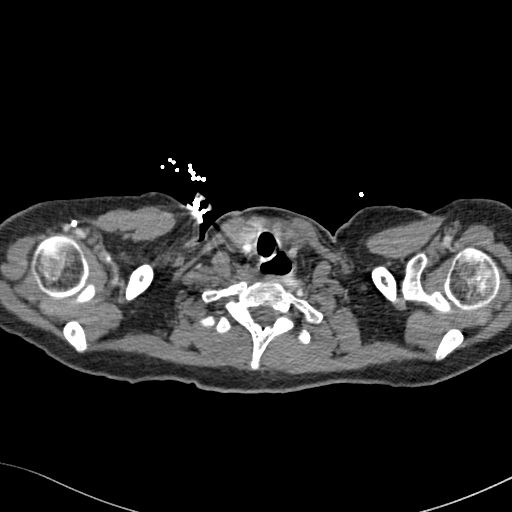

[Series 8: coronal mpr · coronal · 0.47mm/px · 3 of 135 slices shown]
[im 34/135  soft-tissue]
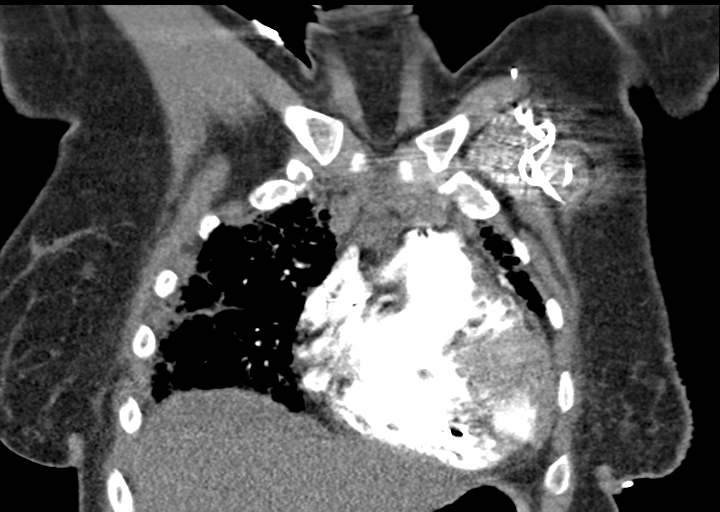
[im 68/135  soft-tissue]
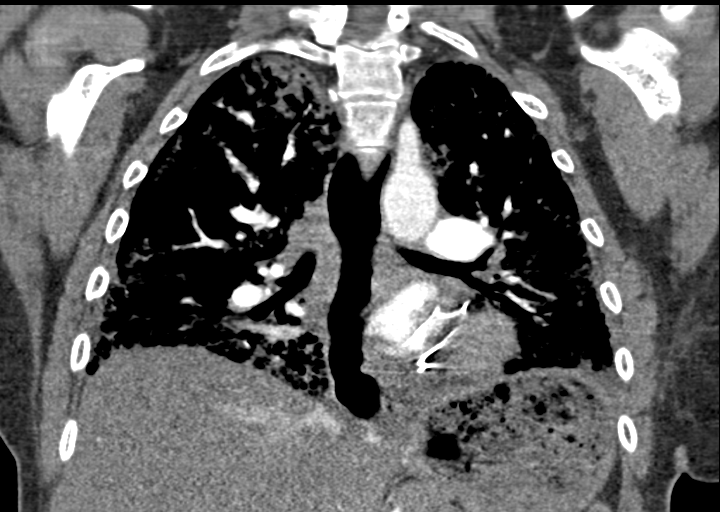
[im 101/135  soft-tissue]
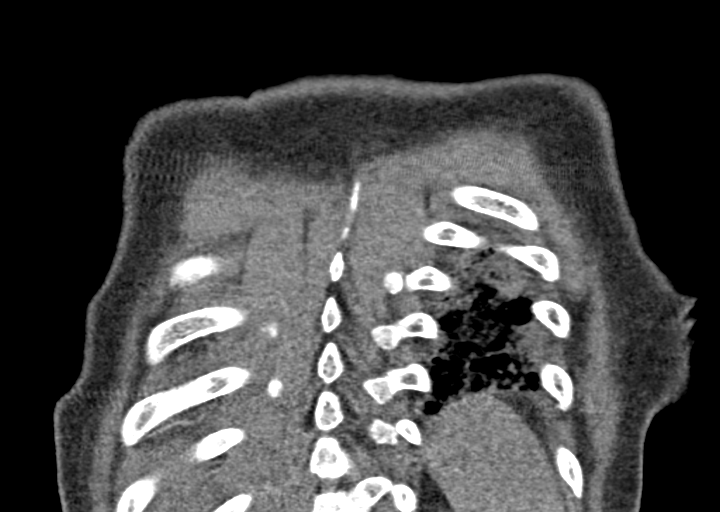

[17 of 46 positions shown; findings below may reference images not displayed]

FINDINGS: Cardiovascular: There is satisfactory opacification of the pulmonary
arteries to the segmental level. There is no evidence of a pulmonary
embolism.

Heart is normal in size. Great vessels are normal in caliber. No
aortic dissection or atherosclerosis.

Mediastinum/Nodes: [place]oddy neck base lymph nodes, all subcentimeter.
No neck base masses.

Shotty mediastinal lymph nodes with generalized increased
attenuation throughout the mediastinal fat. Azygos level right
paratracheal node measures 1 cm short axis. Right paratracheal upper
mediastinal node measures approximately 1.4 cm in short axis.

Trachea is unremarkable. Mild distention of the esophagus. Esophagus
otherwise unremarkable.

Lungs/Pleura: Extensive lung reticulation with intervening areas of
ground-glass opacity. Ill-defined nodules also noted. Findings seen
throughout both lungs, similar in appearance to the prior chest CTA.
Findings consistent with sarcoidosis. Superimpose infection is not
excluded. No convincing pulmonary edema. No pleural effusion or
pneumothorax.

Upper Abdomen: No acute findings. Low attenuation of the liver
consistent with hepatic steatosis.

Musculoskeletal: No chest wall abnormality. No acute or significant
osseous findings.

Review of the MIP images confirms the above findings.
IMPRESSION: 1. No evidence of a pulmonary embolism.
2. Extensive lung abnormalities which are similar to prior studies.
There is a Peri lymphatic ill-defined nodular component of the
opacities throughout the lungs raising suspicion for sarcoidosis as
noted on prior high-resolution chest CTs. A superimposed acute
infection is not excluded but not definitively seen on this exam.
3. Mild shotty mediastinal and neck base adenopathy without
convincing change from the prior CT.

## 2021-12-09 IMAGING — DX DG CHEST 1V PORT
1 series · 1 of 1 positions shown · non-contrast
Comparison: Chest CT from 6 days ago

CLINICAL DATA: Shortness of breath

EXAM:
PORTABLE CHEST 1 VIEW

[chest ap]
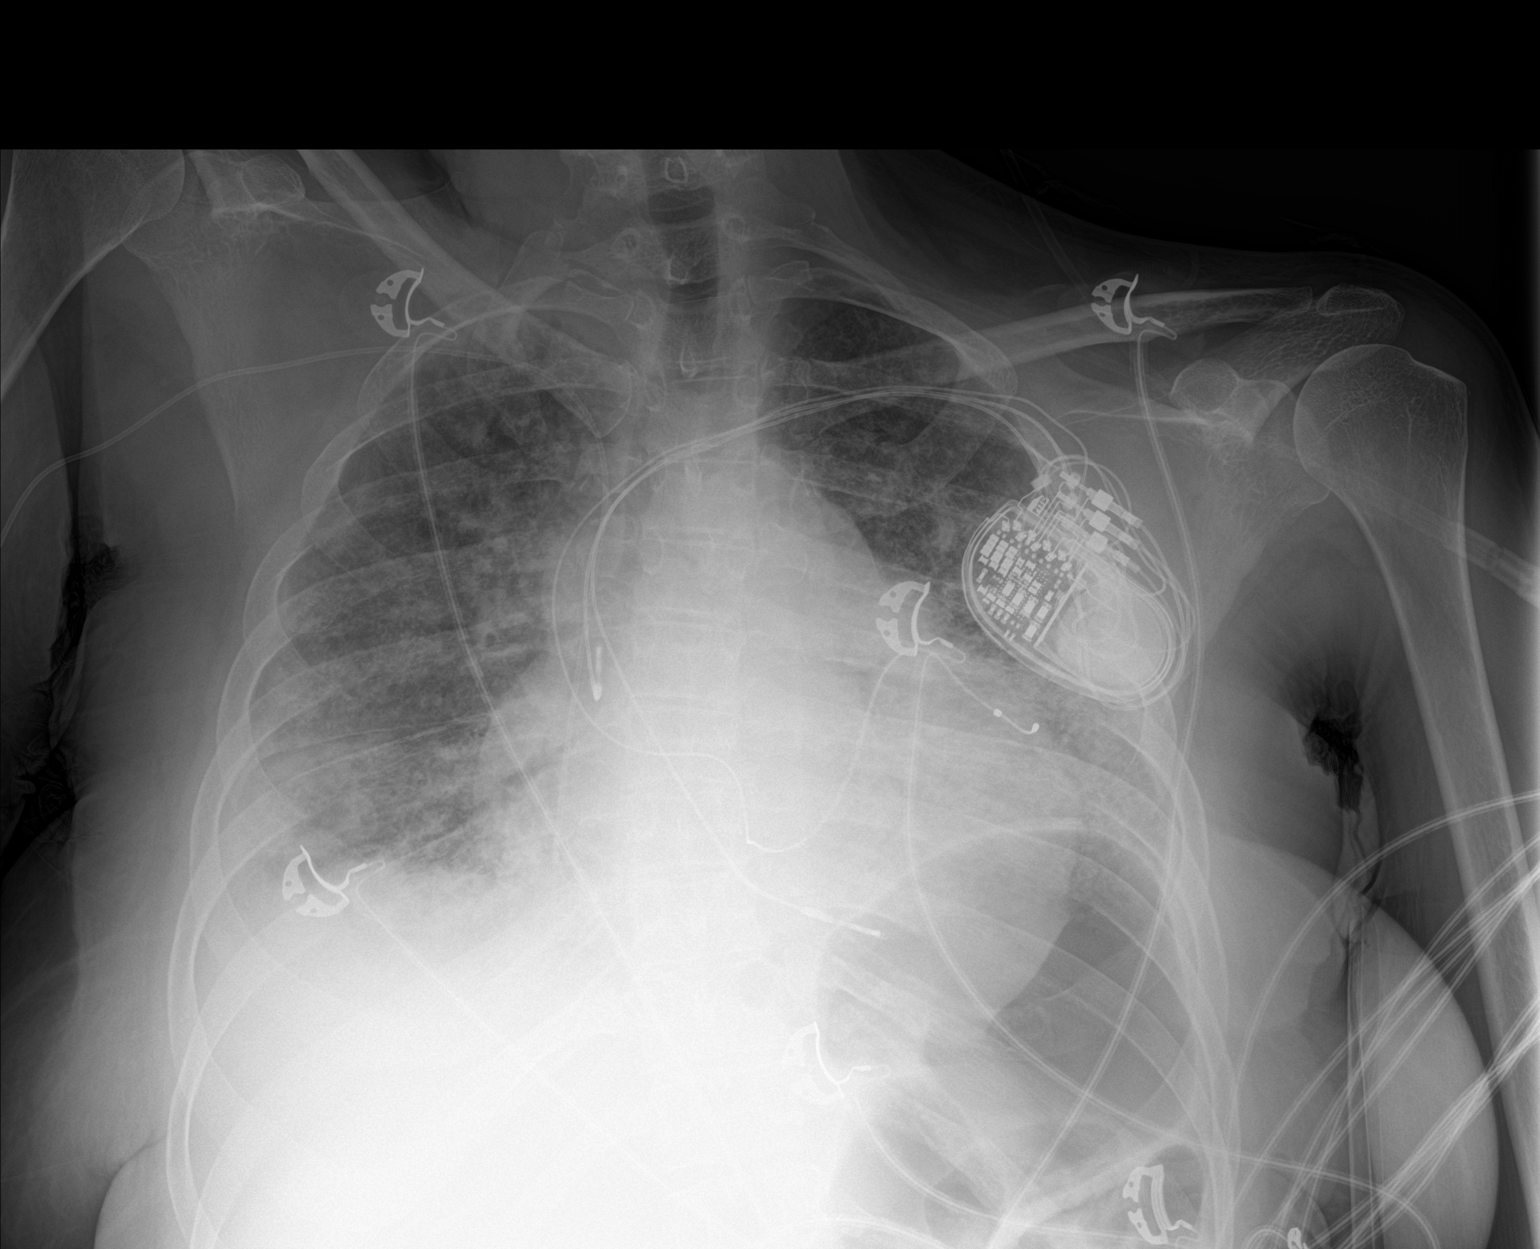

[1 of 1 positions shown; findings below may reference images not displayed]

FINDINGS: Chronic cardiomegaly. There is a biventricular pacer from the left
in stable position. Bilateral interstitial and airspace disease that
is chronic with mildly increased density at the right base. Lung
volumes are low. No visible effusion or air leak. Right-sided PICC
with tip at the right atrium.
IMPRESSION: Chronic cardiomegaly and interstitial lung disease. Superimposed
infection is difficult to detect given the extensive chronic
changes, mild increasing density at the right base may be a sign of
acute airspace disease.

## 2021-12-09 IMAGING — DX DG CHEST 1V PORT
1 series · 1 of 1 positions shown · non-contrast
Comparison: Earlier today

CLINICAL DATA: Tube repositioning

EXAM:
PORTABLE CHEST 1 VIEW

[chest ap]
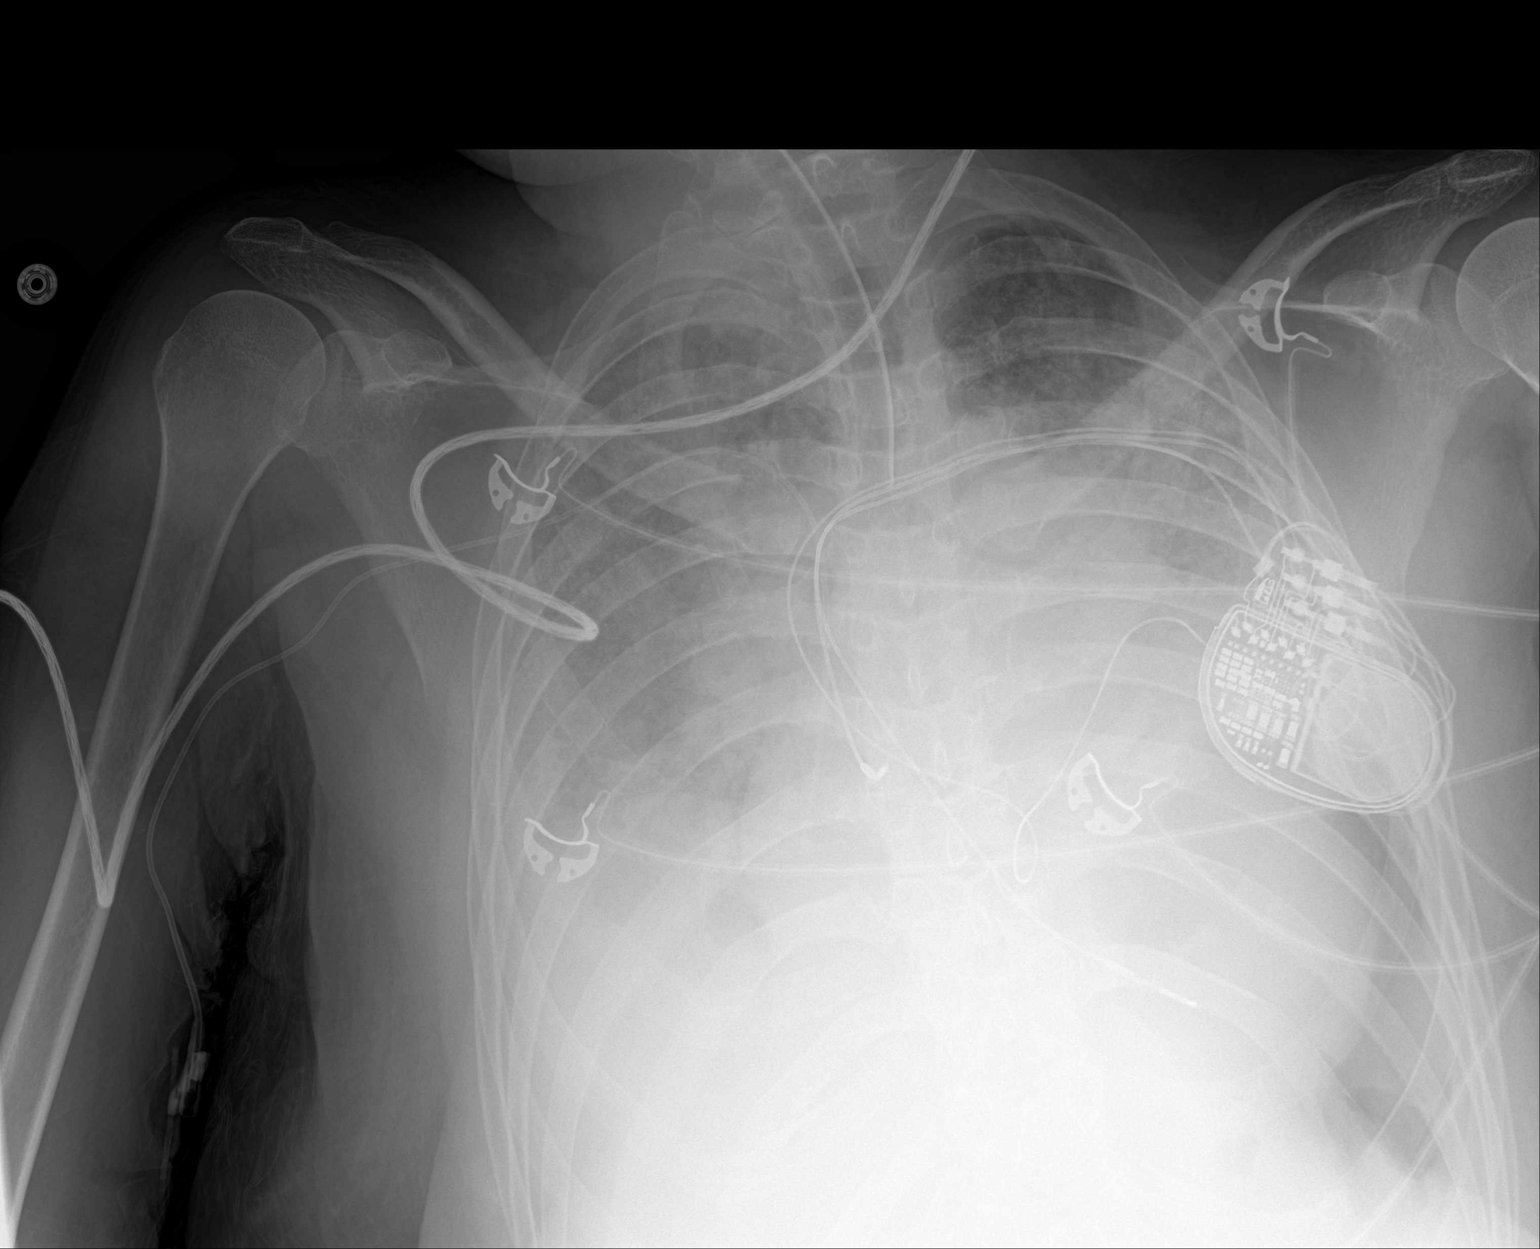

[1 of 1 positions shown; findings below may reference images not displayed]

FINDINGS: Endotracheal tube tip is 5 mm above the carina, improved from before
but still low. Right PICC with tip at the right atrium.
Biventricular pacer from the left. There is significant worsening
lung volumes and pulmonary opacity. Cardiomegaly.
IMPRESSION: 1. Improved endotracheal tube positioning but tip is still low at 5
mm above the carina.
2. Worsening inflation and airspace disease.
